# Patient Record
Sex: Female | Born: 1935
Health system: Southern US, Community
[De-identification: ages and names within clinical notes are randomized; demographics above are authoritative.]

## PROBLEM LIST (undated history)

## (undated) DIAGNOSIS — IMO0002 Reserved for concepts with insufficient information to code with codable children: Secondary | ICD-10-CM

## (undated) DIAGNOSIS — R079 Chest pain, unspecified: Secondary | ICD-10-CM

## (undated) DIAGNOSIS — R112 Nausea with vomiting, unspecified: Secondary | ICD-10-CM

## (undated) DIAGNOSIS — Z87442 Personal history of urinary calculi: Secondary | ICD-10-CM

## (undated) DIAGNOSIS — N189 Chronic kidney disease, unspecified: Secondary | ICD-10-CM

## (undated) DIAGNOSIS — M069 Rheumatoid arthritis, unspecified: Secondary | ICD-10-CM

## (undated) DIAGNOSIS — Z9889 Other specified postprocedural states: Secondary | ICD-10-CM

## (undated) DIAGNOSIS — I340 Nonrheumatic mitral (valve) insufficiency: Secondary | ICD-10-CM

## (undated) DIAGNOSIS — A0472 Enterocolitis due to Clostridium difficile, not specified as recurrent: Secondary | ICD-10-CM

## (undated) DIAGNOSIS — R9430 Abnormal result of cardiovascular function study, unspecified: Secondary | ICD-10-CM

## (undated) DIAGNOSIS — J189 Pneumonia, unspecified organism: Secondary | ICD-10-CM

## (undated) DIAGNOSIS — R002 Palpitations: Secondary | ICD-10-CM

## (undated) DIAGNOSIS — E785 Hyperlipidemia, unspecified: Secondary | ICD-10-CM

## (undated) DIAGNOSIS — K219 Gastro-esophageal reflux disease without esophagitis: Secondary | ICD-10-CM

## (undated) DIAGNOSIS — L409 Psoriasis, unspecified: Secondary | ICD-10-CM

## (undated) DIAGNOSIS — D649 Anemia, unspecified: Secondary | ICD-10-CM

## (undated) DIAGNOSIS — I499 Cardiac arrhythmia, unspecified: Secondary | ICD-10-CM

## (undated) DIAGNOSIS — R413 Other amnesia: Secondary | ICD-10-CM

## (undated) DIAGNOSIS — Z95818 Presence of other cardiac implants and grafts: Secondary | ICD-10-CM

## (undated) DIAGNOSIS — R0989 Other specified symptoms and signs involving the circulatory and respiratory systems: Secondary | ICD-10-CM

## (undated) DIAGNOSIS — K5790 Diverticulosis of intestine, part unspecified, without perforation or abscess without bleeding: Secondary | ICD-10-CM

## (undated) HISTORY — DX: Nonrheumatic mitral (valve) insufficiency: I34.0

## (undated) HISTORY — PX: OTHER SURGICAL HISTORY: SHX169

## (undated) HISTORY — DX: Palpitations: R00.2

## (undated) HISTORY — DX: Reserved for concepts with insufficient information to code with codable children: IMO0002

## (undated) HISTORY — DX: Psoriasis, unspecified: L40.9

## (undated) HISTORY — DX: Hyperlipidemia, unspecified: E78.5

## (undated) HISTORY — DX: Other specified symptoms and signs involving the circulatory and respiratory systems: R09.89

## (undated) HISTORY — PX: FRACTURE SURGERY: SHX138

## (undated) HISTORY — DX: Chest pain, unspecified: R07.9

## (undated) HISTORY — DX: Abnormal result of cardiovascular function study, unspecified: R94.30

## (undated) HISTORY — DX: Gastro-esophageal reflux disease without esophagitis: K21.9

## (undated) HISTORY — DX: Diverticulosis of intestine, part unspecified, without perforation or abscess without bleeding: K57.90

## (undated) HISTORY — DX: Rheumatoid arthritis, unspecified: M06.9

---
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
## 1997-10-05 ENCOUNTER — Ambulatory Visit (HOSPITAL_COMMUNITY): Admission: RE | Admit: 1997-10-05 | Discharge: 1997-10-05 | Payer: Self-pay | Admitting: Endocrinology

## 1998-04-01 ENCOUNTER — Inpatient Hospital Stay (HOSPITAL_COMMUNITY): Admission: EM | Admit: 1998-04-01 | Discharge: 1998-04-02 | Payer: Self-pay | Admitting: Emergency Medicine

## 1998-04-01 ENCOUNTER — Encounter: Payer: Self-pay | Admitting: *Deleted

## 1998-04-02 ENCOUNTER — Encounter: Payer: Self-pay | Admitting: Cardiology

## 1998-11-06 ENCOUNTER — Emergency Department (HOSPITAL_COMMUNITY): Admission: EM | Admit: 1998-11-06 | Discharge: 1998-11-06 | Payer: Self-pay | Admitting: Emergency Medicine

## 1998-11-06 ENCOUNTER — Encounter: Payer: Self-pay | Admitting: Emergency Medicine

## 1999-05-30 ENCOUNTER — Ambulatory Visit (HOSPITAL_COMMUNITY): Admission: RE | Admit: 1999-05-30 | Discharge: 1999-05-30 | Payer: Self-pay | Admitting: Gastroenterology

## 1999-05-30 ENCOUNTER — Encounter (INDEPENDENT_AMBULATORY_CARE_PROVIDER_SITE_OTHER): Payer: Self-pay

## 1999-10-12 ENCOUNTER — Emergency Department (HOSPITAL_COMMUNITY): Admission: EM | Admit: 1999-10-12 | Discharge: 1999-10-12 | Payer: Self-pay | Admitting: Emergency Medicine

## 1999-10-14 ENCOUNTER — Emergency Department (HOSPITAL_COMMUNITY): Admission: EM | Admit: 1999-10-14 | Discharge: 1999-10-14 | Payer: Self-pay | Admitting: Emergency Medicine

## 2000-03-16 ENCOUNTER — Encounter: Payer: Self-pay | Admitting: Emergency Medicine

## 2000-03-16 ENCOUNTER — Emergency Department (HOSPITAL_COMMUNITY): Admission: EM | Admit: 2000-03-16 | Discharge: 2000-03-16 | Payer: Self-pay | Admitting: Emergency Medicine

## 2000-03-18 ENCOUNTER — Emergency Department (HOSPITAL_COMMUNITY): Admission: EM | Admit: 2000-03-18 | Discharge: 2000-03-18 | Payer: Self-pay | Admitting: Emergency Medicine

## 2000-03-23 ENCOUNTER — Emergency Department (HOSPITAL_COMMUNITY): Admission: EM | Admit: 2000-03-23 | Discharge: 2000-03-23 | Payer: Self-pay | Admitting: Emergency Medicine

## 2001-11-14 ENCOUNTER — Encounter: Payer: Self-pay | Admitting: Emergency Medicine

## 2001-11-15 ENCOUNTER — Encounter: Payer: Self-pay | Admitting: Pediatrics

## 2001-11-15 ENCOUNTER — Inpatient Hospital Stay (HOSPITAL_COMMUNITY): Admission: EM | Admit: 2001-11-15 | Discharge: 2001-11-15 | Payer: Self-pay | Admitting: Emergency Medicine

## 2002-06-26 ENCOUNTER — Encounter: Payer: Self-pay | Admitting: Emergency Medicine

## 2002-06-26 ENCOUNTER — Emergency Department (HOSPITAL_COMMUNITY): Admission: EM | Admit: 2002-06-26 | Discharge: 2002-06-26 | Payer: Self-pay

## 2003-06-17 ENCOUNTER — Inpatient Hospital Stay (HOSPITAL_COMMUNITY): Admission: EM | Admit: 2003-06-17 | Discharge: 2003-06-22 | Payer: Self-pay

## 2004-04-12 ENCOUNTER — Encounter: Admission: RE | Admit: 2004-04-12 | Discharge: 2004-04-12 | Payer: Self-pay | Admitting: Endocrinology

## 2004-04-13 ENCOUNTER — Ambulatory Visit: Payer: Self-pay | Admitting: Cardiology

## 2004-04-17 ENCOUNTER — Ambulatory Visit: Payer: Self-pay | Admitting: Cardiology

## 2004-04-26 ENCOUNTER — Ambulatory Visit: Payer: Self-pay | Admitting: Internal Medicine

## 2004-05-19 ENCOUNTER — Ambulatory Visit: Payer: Self-pay | Admitting: Cardiology

## 2004-05-21 ENCOUNTER — Emergency Department (HOSPITAL_COMMUNITY): Admission: EM | Admit: 2004-05-21 | Discharge: 2004-05-21 | Payer: Self-pay | Admitting: Emergency Medicine

## 2004-05-24 ENCOUNTER — Ambulatory Visit: Payer: Self-pay | Admitting: Internal Medicine

## 2004-07-05 ENCOUNTER — Ambulatory Visit: Payer: Self-pay | Admitting: Cardiology

## 2006-06-27 ENCOUNTER — Ambulatory Visit: Payer: Self-pay | Admitting: *Deleted

## 2006-07-02 ENCOUNTER — Ambulatory Visit: Payer: Self-pay | Admitting: Cardiology

## 2006-07-08 ENCOUNTER — Ambulatory Visit: Payer: Self-pay

## 2006-07-18 ENCOUNTER — Ambulatory Visit: Payer: Self-pay | Admitting: Cardiology

## 2006-07-22 ENCOUNTER — Encounter: Admission: RE | Admit: 2006-07-22 | Discharge: 2006-07-22 | Payer: Self-pay | Admitting: Endocrinology

## 2006-08-21 ENCOUNTER — Ambulatory Visit: Payer: Self-pay | Admitting: Cardiology

## 2007-01-20 ENCOUNTER — Encounter: Admission: RE | Admit: 2007-01-20 | Discharge: 2007-01-20 | Payer: Self-pay | Admitting: Endocrinology

## 2008-04-21 ENCOUNTER — Ambulatory Visit (HOSPITAL_COMMUNITY): Admission: RE | Admit: 2008-04-21 | Discharge: 2008-04-21 | Payer: Self-pay | Admitting: Endocrinology

## 2009-02-09 ENCOUNTER — Ambulatory Visit: Payer: Self-pay | Admitting: Cardiology

## 2009-02-09 DIAGNOSIS — M069 Rheumatoid arthritis, unspecified: Secondary | ICD-10-CM | POA: Insufficient documentation

## 2009-02-09 DIAGNOSIS — L408 Other psoriasis: Secondary | ICD-10-CM | POA: Insufficient documentation

## 2009-03-31 ENCOUNTER — Ambulatory Visit: Payer: Self-pay | Admitting: Cardiology

## 2009-04-21 ENCOUNTER — Encounter: Payer: Self-pay | Admitting: Cardiology

## 2009-04-21 ENCOUNTER — Ambulatory Visit: Payer: Self-pay | Admitting: Cardiovascular Disease

## 2009-04-21 ENCOUNTER — Ambulatory Visit: Payer: Self-pay

## 2009-04-21 ENCOUNTER — Ambulatory Visit (HOSPITAL_COMMUNITY): Admission: RE | Admit: 2009-04-21 | Discharge: 2009-04-21 | Payer: Self-pay | Admitting: Cardiology

## 2009-04-21 DIAGNOSIS — I340 Nonrheumatic mitral (valve) insufficiency: Secondary | ICD-10-CM

## 2009-04-21 HISTORY — DX: Nonrheumatic mitral (valve) insufficiency: I34.0

## 2009-07-20 ENCOUNTER — Encounter (INDEPENDENT_AMBULATORY_CARE_PROVIDER_SITE_OTHER): Payer: Self-pay | Admitting: *Deleted

## 2009-07-20 ENCOUNTER — Encounter: Payer: Self-pay | Admitting: Cardiology

## 2009-09-14 ENCOUNTER — Encounter: Payer: Self-pay | Admitting: Cardiology

## 2009-09-15 ENCOUNTER — Ambulatory Visit: Payer: Self-pay | Admitting: Cardiology

## 2009-10-06 ENCOUNTER — Telehealth (INDEPENDENT_AMBULATORY_CARE_PROVIDER_SITE_OTHER): Payer: Self-pay | Admitting: *Deleted

## 2009-12-14 ENCOUNTER — Encounter: Payer: Self-pay | Admitting: Cardiology

## 2010-01-18 ENCOUNTER — Ambulatory Visit: Payer: Self-pay | Admitting: Cardiology

## 2010-02-16 ENCOUNTER — Ambulatory Visit: Payer: Self-pay | Admitting: Cardiology

## 2010-03-26 ENCOUNTER — Encounter: Payer: Self-pay | Admitting: Cardiology

## 2010-03-26 ENCOUNTER — Encounter: Payer: Self-pay | Admitting: Gastroenterology

## 2010-04-04 NOTE — Letter (Signed)
Summary: Appointment - Reminder [place], [place]  [address] [place]   [place], [place] [address]   Phone: [phone]  Fax: [phone]     Jul 20, 2009 MRN: [id number]   [address], [place]  [address]   Dear Ms. [name],  Our records indicate that it is time to schedule a follow-up appointment with Dr. Ron Parker. It is very important that we reach you to schedule this appointment. We look forward to participating in your health care needs. Please contact us at the number listed above at your earliest convenience to schedule your appointment.  If you are unable to make an appointment at this time, give [place] a call so we can update our records.     Sincerely,    Darnell Level [facility] Scheduling Team

## 2010-04-04 NOTE — Miscellaneous (Signed)
  Clinical Lists Changes  Problems: Removed problem of MURMUR (ICD-785.2) Added new problem of MITRAL REGURGITATION (ICD-396.3) Observations: Added new observation of PAST MED HX: mitral regurgitation   mild... echo... February 17, 201 EF 55-60%... echo... April 21, 2009 CHEST PAIN-UNSPECIFIED (ICD-786.50).... nuclear.. May, 2008... no scar or ischemia PALPITATIONS (ICD-785.1)... possible very brief atrial fibrillation on monitor and possible reentrant tachycardia........ symptoms treated well with beta blocker diabetes. Bruit ????... Doppler done per patient with no abnormality (carotid) History of adrenal insufficiency? Rheumatoid arthritis. History of prednisone therapy in the past. GERD. Osteoporosis. Psoriasis (09/14/2009 17:35) Added new observation of PRIMARY MD: Carrolyn Meiers, MD (09/14/2009 17:35)       Past History:  Past Medical History: mitral regurgitation   mild... echo... February 17, 201 EF 55-60%... echo... April 21, 2009 CHEST PAIN-UNSPECIFIED (ICD-786.50).... nuclear.. May, 2008... no scar or ischemia PALPITATIONS (ICD-785.1)... possible very brief atrial fibrillation on monitor and possible reentrant tachycardia........ symptoms treated well with beta blocker diabetes. Bruit ????... Doppler done per patient with no abnormality (carotid) History of adrenal insufficiency? Rheumatoid arthritis. History of prednisone therapy in the past. GERD. Osteoporosis. Psoriasis

## 2010-04-04 NOTE — Assessment & Plan Note (Signed)
Summary: per check out/sf  Medications Added ENBREL 50 MG/ML SOLN (ETANERCEPT) every 2 weeks      Allergies Added:   Visit Type:  Follow-up Primary Provider:  Carrolyn Meiers, MD  CC:  palpitations.  History of Present Illness: The patient is seen for followup of palpitations. when I saw her last on February 09, 2009, I added a small dose of diltiazem if it would help with possible short bursts of supraventricular tachycardia.  She says this is definitely helped and she feels good.  She is not having any chest pain.  Current Medications (verified): 1)  Enbrel 50 Mg/ml Soln (Etanercept) .... Every 2 Weeks 2)  Aspirin 81 Mg Tbec (Aspirin) .... Take One Tablet By Mouth Daily 3)  Glipizide 10 Mg Tabs (Glipizide) .... Take 1 Tablet By Mouth Two Times A Day 4)  Nexium 40 Mg Cpdr (Esomeprazole Magnesium) .... Take 1 Tablet By Mouth Once A Day 5)  Metoprolol Succinate 25 Mg Xr24h-Tab (Metoprolol Succinate) .... Take One Tablet By Mouth Daily 6)  Prednisone 10 Mg Tabs (Prednisone) .... 1/2 Tab Daily 7)  Pre-Natal  Tabs (Prenatal Multivit-Min-Fe-Fa) .... Take 1 Tablet By Mouth Once A Day 8)  Metformin Hcl 1000 Mg Tabs (Metformin Hcl) .... 1/2 Tab Two Times A Day 9)  Caltrate 600+d Plus 600-400 Mg-Unit Tabs (Calcium Carbonate-Vit D-Min) .... Take 1 Tablet By Mouth Once A Day 10)  Simvastatin 80 Mg Tabs (Simvastatin) .... Take 1/2 Tablet By Mouth Daily At Bedtime 11)  Diltiazem Hcl Cr 120 Mg Xr12h-Cap (Diltiazem Hcl) .... Take 1 Capsule By Mouth Once A Day  Allergies (verified): 1)  Sulfa  Past History:  Past Medical History: Last updated: 02/09/2009 MURMUR (ICD-785.2)...soft systolic murmur.... no studies done CHEST PAIN-UNSPECIFIED (ICD-786.50).... nuclear.. May, 2008... no scar or ischemia PALPITATIONS (ICD-785.1)... possible very brief atrial fibrillation on monitor and possible reentrant tachycardia........ symptoms treated well with beta blocker diabetes. Bruit ????... Doppler done per  patient with no abnormality (carotid) History of adrenal insufficiency? Rheumatoid arthritis. History of prednisone therapy in the past. GERD. Osteoporosis. Psoriasis  Review of Systems       Patient denies fever, chills, headache, sweats, rash, change in vision, change in hearing, chest pain, cough, shortness of breath, nausea vomiting, urinary symptoms.  All of the systems are reviewed and are negative.  Vital Signs:  Patient profile:   77 year old female Height:      61 inches Weight:      145 pounds BMI:     27.50 Pulse rate:   70 / minute BP sitting:   134 / 76  (left arm) Cuff size:   regular  Vitals Entered By: Mignon Pine, RMA (March 31, 2009 3:24 PM)  Physical Exam  General:  patient is stable today in general. Eyes:  no xanthelasma. Neck:  no jugular venous distention. Lungs:  lungs are clear respiratory effort is nonlabored. Heart:  cardiac exam reveals S1-S2.  There is a systolic murmur heard. Abdomen:  abdomen is soft. Extremities:  hands are compatible with rheumatoid arthritis.  No peripheral edema. Psych:  patient is oriented to person time and place.  Affect is normal.   Impression & Recommendations:  Problem # 1:  RHEUMATOID ARTHRITIS (ICD-714.0) patient has significant changes of rheumatoid arthritis but she is stable.  Problem # 2:  CHEST PAIN-UNSPECIFIED (ICD-786.50)  Her updated medication list for this problem includes:    Aspirin 81 Mg Tbec (Aspirin) .Marland Kitchen... Take one tablet by mouth daily    Metoprolol  Succinate 25 Mg Xr24h-tab (Metoprolol succinate) .Marland Kitchen... Take one tablet by mouth daily    Diltiazem Hcl Cr 120 Mg Xr12h-cap (Diltiazem hcl) .Marland Kitchen... Take 1 capsule by mouth once a day The patient has not had any chest pain.  No further workup.  Problem # 3:  PALPITATIONS (ICD-785.1)  Her updated medication list for this problem includes:    Aspirin 81 Mg Tbec (Aspirin) .Marland Kitchen... Take one tablet by mouth daily    Metoprolol Succinate 25 Mg  Xr24h-tab (Metoprolol succinate) .Marland Kitchen... Take one tablet by mouth daily    Diltiazem Hcl Cr 120 Mg Xr12h-cap (Diltiazem hcl) .Marland Kitchen... Take 1 capsule by mouth once a day Palpitations are improved with diltiazem.  The medicine and continued.  Problem # 4:  MURMUR (ICD-785.2)  Her updated medication list for this problem includes:    Metoprolol Succinate 25 Mg Xr24h-tab (Metoprolol succinate) .Marland Kitchen... Take one tablet by mouth daily The patient has a systolic murmur.It is now time for [place] to proceed with a 2-D echo to assess LV function and valvular function.  She has never had an echo.  I be in touch with her with the results.  I'll plan to see her back in 6 months.  Orders: Echocardiogram (Echo)  Patient Instructions: 1)  Your physician has requested that you have an echocardiogram.  Echocardiography is a painless test that uses sound waves to create images of your heart. It provides your doctor with information about the size and shape of your heart and how well your heart's chambers and valves are working.  This procedure takes approximately one hour. There are no restrictions for this procedure. 2)  Follow up in 6 months

## 2010-04-04 NOTE — Letter (Signed)
Summary: [facility] Office Note  [facility] Office Note   Imported By: Sallee Provencal 08/11/2009 14:20:39  _____________________________________________________________________  External Attachment:    Type:   Image     Comment:   External Document

## 2010-04-04 NOTE — Assessment & Plan Note (Signed)
Summary: 6 month rov/sl  Medications Added BONIVA 150 MG TABS (IBANDRONATE SODIUM) 1 by mouth monthly BUFFERIN 325 MG TABS (ASPIRIN BUF(CACARB-MGCARB-MGO)) 1 by mouth daily      Allergies Added:   Visit Type:  Follow-up Primary Provider:  Carrolyn Meiers, MD  CC:  palpitations.  History of Present Illness: The patient is seen for followup of palpitations.  I saw her last January, 2011.  She does very well with low-dose beta-blockade and low dose diltiazem.  By history there is possible brief bursts of reentrant supraventricular tachycardia.  There is no need for further workup at this time.  In the morning if she forgets to take her medicine she may feel a few palpitations.  Otherwise she is asymptomatic.  Two-dimensional echo was done in February, 2011.  This showed good LV function.  There was mild mitral regurgitation.  Current Medications (verified): 1)  Enbrel 50 Mg/ml Soln (Etanercept) .... Every 2 Weeks 2)  Aspirin 81 Mg Tbec (Aspirin) .... Take One Tablet By Mouth Daily 3)  Glipizide 10 Mg Tabs (Glipizide) .... Take 1 Tablet By Mouth Two Times A Day 4)  Nexium 40 Mg Cpdr (Esomeprazole Magnesium) .... Take 1 Tablet By Mouth Once A Day 5)  Metoprolol Succinate 25 Mg Xr24h-Tab (Metoprolol Succinate) .... Take One Tablet By Mouth Daily 6)  Prednisone 10 Mg Tabs (Prednisone) .... 1/2 Tab Daily 7)  Pre-Natal  Tabs (Prenatal Multivit-Min-Fe-Fa) .... Take 1 Tablet By Mouth Once A Day 8)  Metformin Hcl 1000 Mg Tabs (Metformin Hcl) .... 1/2 Tab Two Times A Day 9)  Caltrate 600+d Plus 600-400 Mg-Unit Tabs (Calcium Carbonate-Vit D-Min) .... Take 1 Tablet By Mouth Once A Day 10)  Simvastatin 80 Mg Tabs (Simvastatin) .... Take 1/2 Tablet By Mouth Daily At Bedtime 11)  Diltiazem Hcl Cr 120 Mg Xr12h-Cap (Diltiazem Hcl) .... Take 1 Capsule By Mouth Once A Day 12)  Boniva 150 Mg Tabs (Ibandronate Sodium) .Marland Kitchen.. 1 By Mouth Monthly 13)  Bufferin 325 Mg Tabs (Aspirin Buf(Cacarb-Mgcarb-Mgo)) .Marland Kitchen.. 1 By  Mouth Daily  Allergies (verified): 1)  Sulfa  Past History:  Past Medical History: mitral regurgitation   mild... echo... April 21, 2009 EF 55-60%... echo... April 21, 2009 CHEST PAIN-UNSPECIFIED (ICD-786.50).... nuclear.. May, 2008... no scar or ischemia PALPITATIONS (ICD-785.1)... possible very brief atrial fibrillation on monitor and possible reentrant tachycardia........ symptoms treated well with beta blocker diabetes. Bruit ????... Doppler done per patient with no abnormality (carotid) History of adrenal insufficiency? Rheumatoid arthritis. History of prednisone therapy in the past. GERD. Osteoporosis. Psoriasis  Review of Systems       Patient denies fever, chills, headache, sweats, rash, change in vision, change in hearing, chest pain, cough, nausea vomiting, urinary symptoms.  All other systems are reviewed and are negative.  Vital Signs:  Patient profile:   77 year old female Height:      61 inches Weight:      142 pounds BMI:     26.93 Pulse rate:   65 / minute Resp:     16 per minute BP sitting:   112 / 64  (left arm)  Vitals Entered By: Levora Angel, CNA (September 15, 2009 10:10 AM)  Physical Exam  General:  patient is stable. Eyes:  no xanthelasma. Neck:  there is a soft left carotid bruit. Lungs:  lungs are clear.  Respiratory effort is nonlabored. Heart:  cardiac exam reveals S1-S2.  There is a 2/6 systolic murmur. Abdomen:  abdomen is soft. Msk:  The patient does  have changes of rheumatoid arthritis in her hands. Extremities:  no peripheral edema. Psych:  patient is oriented to person time and place.  Affect is normal.   Impression & Recommendations:  Problem # 1:  MITRAL REGURGITATION (ICD-396.3) we know this is mild from her echo in February, 2011.  Problem # 2:  CHEST PAIN-UNSPECIFIED (ICD-786.50)  The patient has not had any recurrent chest pain.  Orders: EKG w/ Interpretation (93000)  Problem # 3:  PALPITATIONS (ICD-785.1) The  patient's palpitations are controlled with meds.  No further workup needed.  EKG is done today and reviewed by me.  Is normal sinus rhythm.  No further workup.  One-year followup.  Patient Instructions: 1)  Your physician wants you to follow-up in:  1 year.  You will receive a reminder letter in the mail two months in advance. If you don't receive a letter, please call our office to schedule the follow-up appointment.

## 2010-04-04 NOTE — Assessment & Plan Note (Signed)
Summary: rov. chestpain/ gd  Medications Added PREVACID 24HR 15 MG CPDR (LANSOPRAZOLE) once daily CRESTOR 10 MG TABS (ROSUVASTATIN CALCIUM) Take one tablet once a week CRESTOR 5 MG TABS (ROSUVASTATIN CALCIUM) Take one tablet once a week NITROSTAT 0.4 MG SUBL (NITROGLYCERIN) 1 tablet under tongue at onset of chest pain; you may repeat every 5 minutes for up to 3 doses.      Allergies Added:   Visit Type:  Add On Primary Provider:  Carrolyn Meiers, MD  CC:  chest pain.  History of Present Illness: The patient is seen today for evaluation of chest pain.  She had had chest pain in the past.  Nuclear scan in 2008 revealed no scar or ischemia.  She had some palpitations in the past.  She felt better with the beta blocker.  The monitor had shown either very brief atrial fibrillation or possibly a brief episode of a reentrant tachycardia.  Days ago she felt poorly.  She does mention or chest discomfort.  However she also mentions to me symptoms that sound more like the possibility of palpitations.  She did take one of her husband's nitroglycerin.  She says that it helped but in the 15 minute range.  She saw Dr. Forde Dandy and he arranged for early followup here.  Current Medications (verified): 1)  Enbrel 50 Mg/ml Soln (Etanercept) .... Every 2 Weeks 2)  Aspirin 81 Mg Tbec (Aspirin) .... Take One Tablet By Mouth Daily 3)  Glipizide 10 Mg Tabs (Glipizide) .... Take 1 Tablet By Mouth Two Times A Day 4)  Prevacid 24hr 15 Mg Cpdr (Lansoprazole) .... Once Daily 5)  Metoprolol Succinate 25 Mg Xr24h-Tab (Metoprolol Succinate) .... Take One Tablet By Mouth Daily 6)  Prednisone 10 Mg Tabs (Prednisone) .... 1/2 Tab Daily 7)  Pre-Natal  Tabs (Prenatal Multivit-Min-Fe-Fa) .... Take 1 Tablet By Mouth Once A Day 8)  Metformin Hcl 1000 Mg Tabs (Metformin Hcl) .... 1/2 Tab Two Times A Day 9)  Caltrate 600+d Plus 600-400 Mg-Unit Tabs (Calcium Carbonate-Vit D-Min) .... Take 1 Tablet By Mouth Once A Day 10)  Diltiazem  Hcl Cr 120 Mg Xr12h-Cap (Diltiazem Hcl) .... Take 1 Capsule By Mouth Once A Day 11)  Boniva 150 Mg Tabs (Ibandronate Sodium) .Marland Kitchen.. 1 By Mouth Monthly 12)  Crestor 5 Mg Tabs (Rosuvastatin Calcium) .... Take One Tablet Once A Week  Allergies (verified): 1)  Sulfa  Past History:  Past Medical History: mitral regurgitation   mild... echo... April 21, 2009 EF 55-60%... echo... April 21, 2009 CHEST PAIN-UNSPECIFIED (ICD-786.50).... nuclear.. May, 2008... no scar or ischemia PALPITATIONS (ICD-785.1)... possible very brief atrial fibrillation on monitor and possible reentrant tachycardia........ symptoms treated well with beta blocker diabetes. Bruit ????... Doppler done per patient with no abnormality (carotid) History of adrenal insufficiency? Rheumatoid arthritis. History of prednisone therapy in the past. GERD. Osteoporosis. Psoriasis.Marland Kitchen Dyslipidemia  Review of Systems       Patient denies fever, chills, headache, sweats, rash, change in vision, change in hearing, cough, nausea vomiting, urinary symptoms.  All other systems are reviewed and are negative.  Vital Signs:  Patient profile:   77 year old female Height:      61 inches Weight:      144 pounds BMI:     27.31 Pulse rate:   69 / minute BP sitting:   112 / 60  (left arm) Cuff size:   regular  Vitals Entered By: Hansel Feinstein CMA (January 18, 2010 3:11 PM)  Physical Exam  General:  patient feels fine today. Head:  head is atraumatic. Eyes:  no xanthelasma. Neck:  no jugular venous distention. Chest Wall:  no chest wall tenderness. Lungs:  lungs are clear.  Respiratory effort is nonlabored. Heart:  cardiac exam reveals S1-S2.  There no clicks or significant murmurs. Abdomen:  abdomen is soft. Msk:  patient has significant changes so deforming rheumatoid arthritis. Extremities:  no peripheral edema. Skin:  no skin rashes. Psych:  patient is oriented to person time and place.  Affect is normal.   Impression &  Recommendations:  Problem # 1:  MITRAL REGURGITATION (ICD-396.3) Mitral regurgitation was mild by echo in February, 2011.  No further workup needed.  Problem # 2:  CHEST PAIN-UNSPECIFIED (ICD-786.50)  Her updated medication list for this problem includes:    Aspirin 81 Mg Tbec (Aspirin) .Marland Kitchen... Take one tablet by mouth daily    Metoprolol Succinate 25 Mg Xr24h-tab (Metoprolol succinate) .Marland Kitchen... Take one tablet by mouth daily    Diltiazem Hcl Cr 120 Mg Xr12h-cap (Diltiazem hcl) .Marland Kitchen... Take 1 capsule by mouth once a day    Nitrostat 0.4 Mg Subl (Nitroglycerin) .Marland Kitchen... 1 tablet under tongue at onset of chest pain; you may repeat every 5 minutes for up to 3 doses. As outlined in the history of present illness, the patient had some chest discomfort and  symptoms souning  like palpitations several days ago.  There is question that nitroglycerin helped but are not sure.  I wonder if her symptoms were not a brief burst of supraventricular tachycardia.  She is fine at this point.  EKG is done and reviewed by me.  EKG is normal.  I've chosen not to change any medicines.  Also chosen not to proceed with a followup exercise test at this point.  I will see her back in several weeks to reassess any symptoms that she is having.  We'll make further decisions on the approach to her care at that time.  Orders: EKG w/ Interpretation (93000)  Problem # 3:  PALPITATIONS (ICD-785.1)  Her updated medication list for this problem includes:    Aspirin 81 Mg Tbec (Aspirin) .Marland Kitchen... Take one tablet by mouth daily    Metoprolol Succinate 25 Mg Xr24h-tab (Metoprolol succinate) .Marland Kitchen... Take one tablet by mouth daily    Diltiazem Hcl Cr 120 Mg Xr12h-cap (Diltiazem hcl) .Marland Kitchen... Take 1 capsule by mouth once a day    Nitrostat 0.4 Mg Subl (Nitroglycerin) .Marland Kitchen... 1 tablet under tongue at onset of chest pain; you may repeat every 5 minutes for up to 3 doses. As outlined the patient has had a short burst of either atrial fib or supraventricular  tachycardia in the past.  It is possible that this is playing a role now.  I chosen not to change her medicines.  Problem # 4:  DYSLIPIDEMIA (ICD-272.4)  The following medications were removed from the medication list:    Simvastatin 80 Mg Tabs (Simvastatin) .Marland Kitchen... Take 1/2 tablet by mouth daily at bedtime Her updated medication list for this problem includes:    Crestor 5 Mg Tabs (Rosuvastatin calcium) .Marland Kitchen... Take one tablet once a week Dr. Forde Dandy and started the patient on a small dose of Crestor.  This sounds like an excellent choice.  Patient Instructions: 1)  Your physician recommended you take 1 tablet  under tongue at onset of chest pain; you may repeat every 5 minutes for up to 3 doses. If 3 or more doses are required, call 911 and proceed to the ER immediately.  2)  Follow up in 3-4 weeks Prescriptions: NITROSTAT 0.4 MG SUBL (NITROGLYCERIN) 1 tablet under tongue at onset of chest pain; you may repeat every 5 minutes for up to 3 doses.  #25 x 6   Entered by:   Kevan Rosebush, RN   Authorized by:   Lorenza Evangelist, MD, [facility]   Signed by:   Kevan Rosebush, RN on 01/18/2010   Method used:   Electronically to        [facility] [phone]* (retail)       [place], [place]  [id number]       Ph: [phone] or [phone]       Fax: [phone]   RxID:   [id number]

## 2010-04-04 NOTE — Progress Notes (Signed)
Summary: SImva vs Diltiazem   ---- Converted from flag ---- ---- 10/06/2009 8:57 AM, Lorenza Evangelist, MD, [facility] wrote: Her primary MD is Dr. Carrolyn Meiers (endocrine). I don't see any labs in flow sheet. I suspect that Dr. Forde Dandy manages her lipids. Call patient, explain issue to her, and have her call Dr. Forde Dandy to get what he wants to do. She will understand.  ---- 10/05/2009 12:00 PM, Mignon Pine, RMA wrote: Ms Yannotti is on Diltiazem 120 and also Simva 80 -- What would you like me to do? ------------------------------  Phone Note Outgoing Call   Call placed by: Mignon Pine, RMA,  October 06, 2009 2:28 PM Summary of Call: lmom for Ms Dunivan.  Need to let her know to talk to Dr Forde Dandy about Simvastatin since she is also on Diltiazem Mignon Pine, [place]  October 06, 2009 2:30 PM   Follow-up for Phone Call        Pt called back and I let her know what was going on, she did get a letter from [facility] that told her not to take the [place]. Told her to call Dr [place] office so  he could get her switched over to something else. Follow-up by: Mignon Pine, RMA,  October 07, 2009 10:43 AM

## 2010-04-06 NOTE — Assessment & Plan Note (Signed)
Summary: 1 month rov.sl  Medications Added METFORMIN HCL 1000 MG TABS (METFORMIN HCL) 1/2 tab once daily CRESTOR 10 MG TABS (ROSUVASTATIN CALCIUM) Take one tablet by mouth once a week      Allergies Added:   Visit Type:  Follow-up Primary Provider:  Carrolyn Meiers, MD  CC:  chest pain.  History of Present Illness: The patient is seen for followup of chest pain.  I had seen her in January 18, 2010.  At that time we decided just to watch her in to see her back for followup.  She is feeling better.  I feel comfortable that we do not need to proceed with any further chest.  Current Medications (verified): 1)  Enbrel 50 Mg/ml Soln (Etanercept) .... Every 2 Weeks 2)  Aspirin 81 Mg Tbec (Aspirin) .... Take One Tablet By Mouth Daily 3)  Glipizide 10 Mg Tabs (Glipizide) .... Take 1 Tablet By Mouth Two Times A Day 4)  Prevacid 24hr 15 Mg Cpdr (Lansoprazole) .... Once Daily 5)  Metoprolol Succinate 25 Mg Xr24h-Tab (Metoprolol Succinate) .... Take One Tablet By Mouth Daily 6)  Prednisone 10 Mg Tabs (Prednisone) .... 1/2 Tab Daily 7)  Pre-Natal  Tabs (Prenatal Multivit-Min-Fe-Fa) .... Take 1 Tablet By Mouth Once A Day 8)  Metformin Hcl 1000 Mg Tabs (Metformin Hcl) .... 1/2 Tab Once Daily 9)  Caltrate 600+d Plus 600-400 Mg-Unit Tabs (Calcium Carbonate-Vit D-Min) .... Take 1 Tablet By Mouth Once A Day 10)  Diltiazem Hcl Cr 120 Mg Xr12h-Cap (Diltiazem Hcl) .... Take 1 Capsule By Mouth Once A Day 11)  Boniva 150 Mg Tabs (Ibandronate Sodium) .Marland Kitchen.. 1 By Mouth Monthly 12)  Crestor 10 Mg Tabs (Rosuvastatin Calcium) .... Take One Tablet By Mouth Once A Week 13)  Nitrostat 0.4 Mg Subl (Nitroglycerin) .Marland Kitchen.. 1 Tablet Under Tongue At Onset of Chest Pain; You May Repeat Every 5 Minutes For Up To 3 Doses.  Allergies (verified): 1)  Sulfa  Past History:  Past Medical History: mitral regurgitation   mild... echo... April 21, 2009 EF 55-60%... echo... April 21, 2009 CHEST PAIN-UNSPECIFIED  (ICD-786.50).... nuclear.. May, 2008... no scar or ischemia PALPITATIONS (ICD-785.1)... possible very brief atrial fibrillation on monitor and possible reentrant tachycardia........ symptoms treated well with beta blocker diabetes. Bruit ????... Doppler done per patient with no abnormality (carotid) History of adrenal insufficiency? Rheumatoid arthritis. History of prednisone therapy in the past. GERD. Osteoporosis... Psoriasis.Marland Kitchen Dyslipidemia  Review of Systems       The patient denies fever, chills, headache, sweats, rash, change in vision, change in hearing, chest pain, cough, nausea vomiting, urinary symptoms.  She has arthritis that is significant with her rheumatoid arthritis.  This bothers her but she tolerates it.  All other systems are reviewed and are negative.  Vital Signs:  Patient profile:   77 year old female Height:      61 inches Weight:      142 pounds BMI:     26.93 Pulse rate:   65 / minute BP sitting:   108 / 50  (left arm) Cuff size:   regular  Vitals Entered By: Mignon Pine, RMA (February 16, 2010 10:51 AM)  Physical Exam  General:  he is stable today. Eyes:  no xanthelasma. Neck:  no jugular venous distention. Lungs:  lungs are clear respiratory effort is nonlabored. Heart:  cardiac exam reveals S1-S2.  No clicks or significant murmurs. Abdomen:  abdomen soft. Msk:  she has deforming rheumatoid arthritis. Extremities:  no peripheral edema. Psych:  patient  is oriented to person time and place.  Affect is normal.   Impression & Recommendations:  Problem # 1:  DYSLIPIDEMIA (ICD-272.4)  Her updated medication list for this problem includes:    Crestor 10 Mg Tabs (Rosuvastatin calcium) .Marland Kitchen... Take one tablet by mouth once a week Lipids are treated.  No change in therapy.  Problem # 2:  CHEST PAIN-UNSPECIFIED (ICD-786.50)  Her updated medication list for this problem includes:    Aspirin 81 Mg Tbec (Aspirin) .Marland Kitchen... Take one tablet by mouth daily     Metoprolol Succinate 25 Mg Xr24h-tab (Metoprolol succinate) .Marland Kitchen... Take one tablet by mouth daily    Diltiazem Hcl Cr 120 Mg Xr12h-cap (Diltiazem hcl) .Marland Kitchen... Take 1 capsule by mouth once a day    Nitrostat 0.4 Mg Subl (Nitroglycerin) .Marland Kitchen... 1 tablet under tongue at onset of chest pain; you may repeat every 5 minutes for up to 3 doses. Chest pain is stable.  No further cardiac workup.  Problem # 3:  PALPITATIONS (ICD-785.1)  Her updated medication list for this problem includes:    Aspirin 81 Mg Tbec (Aspirin) .Marland Kitchen... Take one tablet by mouth daily    Metoprolol Succinate 25 Mg Xr24h-tab (Metoprolol succinate) .Marland Kitchen... Take one tablet by mouth daily    Diltiazem Hcl Cr 120 Mg Xr12h-cap (Diltiazem hcl) .Marland Kitchen... Take 1 capsule by mouth once a day    Nitrostat 0.4 Mg Subl (Nitroglycerin) .Marland Kitchen... 1 tablet under tongue at onset of chest pain; you may repeat every 5 minutes for up to 3 doses. No significant palpitations.  No further workup.  I will see her back for one year followup.  Patient Instructions: 1)  Your physician wants you to follow-up in:  1 year.  You will receive a reminder letter in the mail two months in advance. If you don't receive a letter, please call our office to schedule the follow-up appointment.

## 2010-07-18 NOTE — Assessment & Plan Note (Signed)
[place] OFFICE NOTE   ADDISYN, Shelby Walker                          MRN:          [id number]  DATE:08/21/2006                            DOB:          Feb 10, 1934    [name] is seen for followup.  See my complete note of July 02, 2006.  She had had some palpitations, and she had had some chest discomfort.  We proceeded with a stress Myoview scan on Jul 08, 2006.  She was  stressed with adenosine.  This was a normal study.  There was normal  wall motion, and nuclear images are normal with no signs of scar or  ischemia.  We also had her wear da CardioNet monitor.  She had sinus  rhythm.  At times, she did have some vague discomfort and palpitations,  and there was rare, mild sinus tachycardia.  Most of the time, she was  in sinus rhythm.  There were no documented tachy or brady arrhythmias.   She feels well today.   Blood pressure is 120/72, and her pulse is 76.   Problems are listed on my note of July 02, 2006.  The patient is  stable.   PROBLEMS:  1. Chest discomfort.  Her Myoview is normal.  2. Palpitation.  Her monitor is stable.  There is no need to consider      Coumadin.  There is no documented atrial fibrillation of      significance.  She is stable, and further cardiac workup is not      needed.     Carlena Bjornstad, MD, [facility]  Electronically Signed    JDK/MedQ  DD: 08/21/2006  DT: 08/21/2006  Job #: [id number]   cc:   Annie Main A. Forde Dandy, M.D.  Michael Litter, M.D.

## 2010-07-21 NOTE — Assessment & Plan Note (Signed)
[place] OFFICE NOTE   AYRIANNA, MCDERMITT                          MRN:          [id number]  DATE:07/02/2006                            DOB:          August 09, 1933    [name] is seen for cardiac evaluation.  I saw her last in May of  2006.  She is having more palpitations.  In the past, she wore a  monitor.  We saw what we thought was possibly a small amount of atrial  fibrillation, but this may have well have been a re-entrant tachycardia.  She has been on low dose beta blocker, and she has done very well, but  more recently, she has had more palpitations.  In addition, frequently  when she has these palpitations, she has pain that goes to her back;  however, at other times, she will have this type of back pain in leaning  over the kitchen sink.  It is not clear if this is angina or not.  We  know from 2005 that she had a nuclear exercise study with no ischemia.  She does have risk factors for coronary disease.   She saw Dr. Forde Dandy recently.  He was concerned after hearing a bruit in  the left neck and arranged for Dopplers.  It is my understanding from  the patient that they showed no significant abnormality.  She is now  here for further cardiac assessment.   The patient has also had some dizziness.  There has been no syncope or  presyncope.   PAST MEDICAL HISTORY:  SULFA, COMPAZINE.   MEDICATIONS:  Humibid, aspirin, Toprol, Nexium, Metformin, Zocor,  Enbrel, Boniva, Glipizide, Anital Ultra, and calcium.   OTHER MEDICAL PROBLEMS:  See the list below.   REVIEW OF SYSTEMS:  In general, she is feeling well.  She is not having  any significant GI or GU symptoms.  The major symptoms have been  described in the HPI.  Otherwise, her review of systems is negative.   PHYSICAL EXAMINATION:  VITAL SIGNS:  Weight is 153 pounds.  Blood  pressure is 120/62 with a pulse of 81.  GENERAL:  Patient is oriented to  person, time, and place.  Her affect is  normal.  She is here with her husband, Shelby Walker, who is also my  patient.  HEENT:  No xanthelasma.  She has normal extraocular motion.  NECK:  There is a question of a soft left carotid bruit.  There is no  jugular venous distention.  LUNGS:  Clear.  Respiratory effort is not labored.  CARDIAC:  An S1 with an S2.  There is a 2/6 systolic murmur that is  crescendo/decrescendo.  ABDOMEN:  Soft.  There are no masses or bruits.  She has normal bowel  sounds.  EXTREMITIES:  She has no peripheral edema.  The patient has classic  changes in her hands of deforming arthritis, rheumatoid arthritis.   EKG copy sent from Dr. Forde Dandy reveals no new diagnostic abnormalities  when compared to her prior tracing.   PROBLEMS:  1.  History of diabetes.  2. History of adrenal insufficiency?  3. Rheumatoid arthritis.  4. History of prednisone therapy in the past.  5. GERD.  6. Osteoporosis.  7. Psoriasis.  8. History of right-sided chest pain in April, 2005 with normal      ejection fraction and no ischemia.  Current chest and back      discomfort.  It is appropriate to proceed with an Adenosine Myoview      that will be done.  9. History of hypertensive response to exercise in the past.  10.Palpitations.  We think she has had some supraventricular      tachycardia.  It will be important to try to redocument what we      think her rhythm really is.  We have to be sure there is not a need      for Coumadin if she has atrial fib.  11.Soft systolic murmur.  When I see her back, we will consider an      echocardiogram.  As far as I can tell, she has never had one.  12.History in the past of low grade rheumatoid lung disease by chest x-      ray and serial CT scans.  13.Mild dizziness recently.  The Doppler was done, as described above.      We will rule out cardiac basis for this.   We will arrange for the monitor and Myoview scan.  I will see her back  in  followup.  I have chosen not to change her medicines at this time.     Carlena Bjornstad, MD, [facility]  Electronically Signed    JDK/MedQ  DD: 07/02/2006  DT: 07/03/2006  Job #: [id number]   cc:   Annie Main A. Forde Dandy, M.D.  Michael Litter, M.D.

## 2010-07-21 NOTE — H&P (Signed)
NAMEELLIET, FALLA                             ACCOUNT NO.:  [id number]   MEDICAL RECORD NO.:  [id number]                   PATIENT TYPE:  INP   LOCATION:  [id number]                                 FACILITY:  [facility]   PHYSICIAN:  Princess Bruins. Gaynell Face, M.D.           DATE OF BIRTH:  1933/09/04   DATE OF ADMISSION:  11/15/2001  DATE OF DISCHARGE:                                HISTORY & PHYSICAL   CLINICAL HISTORY:  The patient is a 77 year old right-handed married woman  who had a two-day history of headache.  I was asked by the emergency  department to see the patient because of unsteadiness and possible  subarachnoid hemorrhage.   HISTORY OF PRESENT ILLNESS:  The patient had onset yesterday of severe  occipital headache simultaneously with onset of nausea and vomiting.  She  thought that she had some form of intestinal flu and treated herself  symptomatically for a day.  She came to the emergency room about 3 p.m.  I  was contacted about 11:30 p.m. after a lumbar puncture showed evidence of  blood that was present in the lumbar puncture, dropping from 1610 red blood  cells to 290 for tubes 1-4, eight white blood cells and 2 white blood cells  on tubes 2-4, normal glucose and protein, 60 and 44 respectively.   The supernatant was not bathochromic.  The patient initially responded to  morphine sulfate and Phenergan and seemed to be doing better.  However, when  the medicines worse off she felt much worse.   REVIEW OF SYSTEMS:  Remarkable for low-grade fever, temperature of 100.  No  other symptoms of infection including cough, rhinorrhea, diarrhea, or rash.  No chest pain, palpitations, shortness of breath, dysuria, bleeding  dyscrasias.  The patient has musculoskeletal pain from severe rheumatoid  arthritis.  No focal neurologic complaints.  Review of systems otherwise  negative.   PAST MEDICAL HISTORY:  1. Rheumatoid arthritis.  2. A 15-year history of type 2 diabetes mellitus.  3.  Hyperlipidemia.  4. Gastroesophageal reflux disease.  5. Osteoporosis.   PAST SURGICAL HISTORY:  None.   MEDICATIONS:  1. Fosamax 70 mg once per week.  2. Glucotrol XL 10 mg 1 twice a day.  3. Glucophage 500 mg 1 twice a day.  4. Avandia 8 mg 1/2 at bedtime.  5. Lantus insulin 20 units at bedtime as needed.  6. Zetia  10 mg at bedtime.  7. Nexium 40 mg per day.  8. Methotrexate 0.25 mg every other week.  9. Remicade 30 mg, interval unknown.  10.      Prednisone 5 mg in the morning, 2.5 mg at nighttime.  11.      Prenate vitamins 1 per day.  12.      Calcium with vitamin D 600 mg 2 tablets per day.   ALLERGIES:  None known.   FAMILY HISTORY:  Positive for  myocardial infarction and diabetes in both  parents.  No history of stroke.   SOCIAL HISTORY:  The patient is married, no children.  She does not use  tobacco or alcohol.   PHYSICAL EXAMINATION:  VITAL SIGNS:  Blood pressure 122/53, resting pulse  83, respirations 24, temperature 97.5.  HEENT:  Ears, nose, and throat:  No infection.  NECK:  Supple.  No bruits.  LUNGS:  Clear.  HEART:  No murmurs.  Pulses normal.  ABDOMEN:  Soft.  Bowel sounds normal.  No hepatosplenomegaly.  EXTREMITIES:  Normal.  NEUROLOGIC:  The patient is awake, had mild lethargy.  No dysphagia or  dystaxia.  Cranial nerves:  Round and reactive pupils.  Visual fields full.  Extraocular movements full.  No diplopia.  Symmetric face.  No nystagmus.  Normal hearing.  Motor examination:  Normal strength except the hip flexors  which are 4/5.  No drift.  Fine motor movements are normal.  Sensory shows  the peripheral polyneuropathy.  Cerebellar:  Finger-to-nose, rapid  alternating movements are okay.  Gait was not tested but was broad-based.  The patient was unsteady, per Dr. Atilano Median.  Deep tendon reflexes are absent,  and the patient had bilateral flexor plantar responses.   IMPRESSION:  Headache, code 884, nausea and vomiting:  The patient may have  a  viral syndrome.  Could have a posteroinferior cerebellar artery stroke.  Her symptoms could be worsened with an addisonian-like situation due to  prednisone withdrawal.   PLAN:  MRI/MRA in the morning.  Will treat the patient with sliding scale  insulin, IV Protonix, stress dose of Solu-Medrol, and Demerol and Phenergan.  I will contact Dr. Forde Dandy or his colleagues tomorrow to assist [place] with the  endocrine portion of this patient's care.                                               Princess Bruins. Gaynell Face, M.D.    [facility]  D:  11/15/2001  T:  11/15/2001  Job:  [id number]   cc:   Annie Main A. Forde Dandy, M.D.  [address]  [place]  [place] [address]  Fax: [phone]

## 2010-07-21 NOTE — Consult Note (Signed)
NAME:  Shelby, Walker                             ACCOUNT NO.:  [id number]   MEDICAL RECORD NO.:  [id number]                   PATIENT TYPE:  INP   LOCATION:  [id number]                                 FACILITY:  [facility]   PHYSICIAN:  Dola Argyle, M.D.                  DATE OF BIRTH:  [birth year]   DATE OF CONSULTATION:  DATE OF DISCHARGE:                                   CONSULTATION   Shelby Walker is currently admitted to [facility] with a GI illness.  She has a  history of rheumatoid arthritis and is known to Dr. Kristen Loader.  She is  followed also by Dr. Carrolyn Meiers.  Dr. Dagmar Hait called today to ask [place] to help  assess her chest pain (I take care of the patient's husband).  The patient  also had had a Cardiolite scan at our office on June 10, 2003.  I now have  those results and they show that the patient had no scar or ischemia.  There  was normal ejection fraction.  She did have a hypertensive response.   The patient had chest pain for many hours yesterday.  There is question that  it was helped with GI medicines.  EKG is unchanged and so far enzymes are  negative.   PAST MEDICAL HISTORY:  Allergies:  SULFA, COMPAZINE, and REMICADE.   Medications:  Prior to admission to admission her medications include:  1. Multivitamin.  2. Nexium.  3. Prednisone 5 mg in the morning and 2.5 mg in the evening.  4. Aspirin.  5. Methotrexate.  6. Glucophage.  7. Calcium.  8. Fosamax.  9. Imodium p.r.n.  10.      Glucotrol.  11.      Avandia.  12.      Zocor.   Other medical problems:  See the complete list below.   SOCIAL HISTORY:  The patient lives with her husband of many years.  She does  not smoke.   FAMILY HISTORY:  There is no history of premature coronary disease.   REVIEW OF SYSTEMS:  The patient was admitted with nausea, vomiting, and  fever, but this is significantly improved.  She does not have significant  shortness of breath at this time.  Her diarrhea is improving.  Her chest  pain  that was present yesterday is now gone.  Remainder of her review of  systems is negative.   PHYSICAL EXAMINATION:  VITAL SIGNS:  Blood pressure is 120/70.  Pulse was  72.  CHEST:  Lungs reveal question of a few scattered rales or rhonchi.  NECK:  Normal.  HEENT:  No marked abnormalities.  CARDIAC:  S1 with an S2 but no clicks or significant murmurs.  ABDOMEN:  Obese.  She has no peripheral edema.  There are no significant  musculoskeletal deformities.   EKG reveals no significant abnormalities.  First troponin is normal.  A  chest x-ray report is not available to me at this time.   PROBLEMS:  1. History of diabetes.  2. Adrenal insufficiency.  The patient has been on steroids, and she is     being treated for this problem very nicely here at the hospital.  3. Current gastrointestinal illness, which is improving.  4. Rheumatoid arthritis.  5. Chronic prednisone therapy.  6. Gastroesophageal reflux disease.  7. Osteoporosis.  8. Psoriasis.  9. * Current chest pain.   The patient has had different types of chest pain over time.  This is why  her Cardiolite scan was ordered by Dr. Kristen Loader in April 2005.  She says  that last night's pain was different.  It lasted for many hours, and so far  enzymes and EKGs are normal.  At this point I am not convinced this  represents coronary disease.  However, with her multiple risk factors we  certainly must keep this in mind.   I will wait for further enzymes today and follow he EKGs.  As her GI status  completely stabilized, we will decide if the patient needs any further  inpatient evaluation or not.                                               Dola Argyle, M.D.    Shelby Walker  D:  06/19/2003  T:  06/19/2003  Job:  [id number]   cc:   Shelby Walker, M.D.  [place]  [place] [address]  Fax: Shelby Walker, M.D.  [address] [place]  [place] [address]  Fax: [employer]. Shelby Walker, M.D.  [address]  [place]  [place] [address]  Fax: [phone]

## 2010-07-21 NOTE — Discharge Summary (Signed)
NAME:  Shelby Walker, PINKSTAFF                             ACCOUNT NO.:  [id number]   MEDICAL RECORD NO.:  [id number]                   PATIENT TYPE:  INP   LOCATION:  [place]                                 FACILITY:  [facility]   PHYSICIAN:  Ishmael Holter. Forde Dandy, M.D.              DATE OF BIRTH:  12-22-1933   DATE OF ADMISSION:  06/17/2003  DATE OF DISCHARGE:  06/22/2003                                 DISCHARGE SUMMARY   DISCHARGE DIAGNOSES:  1. Nausea, vomiting, and fever, probable gastroenteritis, clinically     cleared.  2. Inter-hospital development of left upper lobe pneumonia, clinically     improved.  3. Chest pain with myocardial infarction ruled out and recent normal     Cardiolite.  4. Possible early fluid overload with minimally elevated BNP and no chest x-     ray evidence of congestive heart failure.  5. Type 2 diabetes, worsened by steroids, clinically improving.  6. Steroid dependent rheumatoid arthritis.  7. History of gastroesophageal reflux.  8. Osteoporosis, on treatment.  9. Hyperlipidemia.   CONSULTATIONS:  Cardiology, Dr. Dola Argyle.   PROCEDURES:  CT scan of the abdomen and pelvis at presentation.   HISTORY:  [name] is a 77 year old white female with longstanding  rheumatoid arthritis and fairly recently developed type 2 diabetes who  presented with nausea, vomiting, and diarrhea.  She was admitted initially,  hydrated, and sources of infection were looked for.  She was not treated  initially with antibiotics, and she defervesced and her leukocytosis  resolved.  She was improving and it was thought we might be able to let her  go home, and she developed a new cough, an infiltrate in the right upper  lobe which may have been due to aspiration after her vomiting, some evidence  of fluid overload, as well as chest pain.  She recently had a cardiac workup  with a negative Cardiolite just a couple of weeks before.  Cardiology saw  her here in the hospital, and has been  helping along during this time.  She  seems to have settled back down with no evidence of fluid overload at  present.  She had a good night last night with the exception of some  nocturia several times which she blamed on diuretics, and interestingly she  is not getting any diuretics at the present time.  She has had no fever in  several days.  Her labs have normalized fairly nicely.  She has had no  further chest pain.  Her sugars have come down as we weaned the steroids,  and she overall is improved.  Systolic blood pressure is [id number], diastolic of  70, pulse 62, respirations 16, temperature 97.6, blood sugar this morning is  140.  Most recent laboratory data this morning:  Chemistries showed a sodium  of 141, potassium 3.5, chloride 110, CO2 26, BUN 14, creatinine 0.7, glucose  142.  Calcium 7.5.  Brain natruretic peptide is 185.  On June 17, 2003, the  urine showed 40 of ketones, small blood, otherwise negative.  Specific  gravity was 1.03 with a pH of 6.  Earlier labs at presentation:  Sodium was  136, potassium 3.6, chloride 104, CO2 25, BUN 16, creatinine 1, glucose 170,  calcium 9.2, total protein 6.3, albumin 3.3.  Initial white count was  13,200, with 87 neutrophils and 9 lymphs, 5 monos, 2 eosinophils.  Her  hemoglobin was 12.9, MCV 86.3, platelets 183,000.  Liver function tests have  been normal starting with an AST of 20, ALT of 12, ALP of 44, total  bilirubin of 1.2.  CK's x3 were 84, 80, and 111, with the last having a  borderline relative index of 1.6.  Troponin's were normal at less than 0.01,  0.02, and 0.01.  Influenza A and B were screened at presentation and were  negative.  Blood cultures were ordered and obviously never done.  Chest x-  ray from June 21, 2003, shows generalized peribronchial thickening,  residual infiltrate, and/or peribronchial thickening in the left upper lung  field.  Also, somewhat greater atelectasis of the lower lung field,  flattening of the  hemidiaphragm, some increased AP diameter of the chest  suggesting COPD.  They recommended followup which will be scheduled.  On  June 19, 2003, there is left upper lobe air space disease representing  pneumonia.  CT scan of the abdomen and pelvis on June 17, 2003, showed  calcified granuloma of the left lobe of the liver, no significant  abnormalities of the liver.  Spleen was in upper limits of normal with no  focal abnormalities.  Gallbladder is unremarkable with no ductal dilatation.  The adrenal glands are small, but otherwise normal.  Kidneys are normal.  Small hiatal hernia was noted.  There is large and small bowel in the upper  abdomen looked normal, no free fluid.  There was enlargement of the left  ovarian vein made as a side note.  CT scan of the pelvis opaque material in  the rectosigmoid due to ingested medication.  Uterus and adnexa were  unremarkable.  Bladder grossly normal.  There was no adenopathy, no free  fluid.  There was a left ovarian varicocele which they said could cause  chronic pelvic pain.  The patient has had no such pain.  Echocardiogram with  sinus tachycardia and basically normal otherwise.  As noted, she had the  Cardiolite.  A copy of this was put in the chart here from June 10, 2003.  She had a hypertensive response, but otherwise she went 4 minutes 31 seconds  with no significant ectopy and no diagnostic findings of ischemia.   In summary, we have a 77 year old white female with known diabetes,  rheumatoid arthritis, steroid dependent, presenting with nausea, vomiting,  and diarrhea.  Initially, she had a viral gastroenteritis.  This appeared to  improve, and then she developed an intra-hospital pneumonia that may have  been due to aspiration.  Additionally, she has cardiac symptoms that were  again worked up and there was no evidence of significant fluid overload or myocardial injury.  She seems ready for discharge home.  An unanswered  question  would be whether she can continue to control her diabetes on oral  agents alone.  Her A1C's by memory have been in the 6 or below range, so she  has done well previously.  With a normal ejection fraction  and no evidence  of significant fluid overload here, I think we can continue on the Avandia  and Glucophage as we did before.  Obviously, if she develops anymore  congestive heart failure symptoms or a high BNP, this will have to be re-  evaluated.  Her Glucotrol will be continued as well.  Doses of  ___________four daily, Glucophage is 500 mg b.i.d., Glucotrol XL is 10 mg  b.i.d., Zocor is 40 mg daily, Fosamax 70 mg weekly, calcium with vitamin D  daily, methotrexate weekly.  Her prednisone will be at a dose of 15 mg for  an additional five days, then down to 10 mg for five more days, then back to  her baseline of 7.5 mg daily.  She will be on Nexium daily and aspirin  daily.  New medications include Vasotec 2.5 mg b.i.d.  She will have  followup with Dr. Ron Parker on Jul 05, 2003, at 9:30 a.m.  She will see me in the  next week to 10 days.   DIET:  As before.   PAIN MANAGEMENT:  As before.   WOUND CARE:  There are no wounds to dress.                                               Ishmael Holter. Forde Dandy, M.D.    SAS/MEDQ  D:  06/22/2003  T:  06/22/2003  Job:  [id number]

## 2010-07-21 NOTE — Discharge Summary (Signed)
NAME:  Shelby Walker, Shelby Walker                             ACCOUNT NO.:  [id number]   MEDICAL RECORD NO.:  [id number]                   PATIENT TYPE:  INP   LOCATION:  [place]                                 FACILITY:  [facility]   PHYSICIAN:  Ishmael Holter. Forde Dandy, M.D.              DATE OF BIRTH:  1933-07-25   DATE OF ADMISSION:  06/17/2003  DATE OF DISCHARGE:  06/22/2003                                 DISCHARGE SUMMARY   ADDENDUM:  Additional medication I left off the list was Avelox 400 mg once  daily for an additional seven days.  Also, repeat chest x-ray done in our  office.                                               Ishmael Holter. Forde Dandy, M.D.    SAS/MEDQ  D:  06/22/2003  T:  06/22/2003  Job:  [id number]

## 2010-07-21 NOTE — H&P (Signed)
NAME:  Shelby Walker, BUREAU                             ACCOUNT NO.:  [id number]   MEDICAL RECORD NO.:  [id number]                   PATIENT TYPE:  EMS   LOCATION:  ED                                   FACILITY:  [facility]   PHYSICIAN:  Ermalene Searing. Philip Aspen, M.D.            DATE OF BIRTH:  1934-01-22   DATE OF ADMISSION:  06/17/2003  DATE OF DISCHARGE:                                HISTORY & PHYSICAL   CHIEF COMPLAINT:  Nausea and vomiting.   HISTORY OF PRESENT ILLNESS:  The patient is a 77 year old white female, who  began to have episodes of nausea and vomiting on June 15, 2003.  Symptoms  began in the evening and continued the next day with multiple episodes of  greenish-yellow emesis with tactile temperatures.  She currently has mild  nausea with fatigue and tactile temperatures.  She presented to the [facility] Emergency Room for evaluation.  In the emergency room, she has been  treated with some IV fluids, Phenergan IV, Zofran IV, and Tylenol.   PAST MEDICAL HISTORY:  1. Rheumatoid arthritis for which she has taken daily prednisone for many     years.  2. She also has gastroesophageal reflux disease.  3. Diabetes mellitus, type 2.  4. Osteoporosis.  5. Psoriasis.  6. Presumed iatrogenic adrenal insufficiency.   MEDICATIONS PRIOR TO ADMISSION:  1. Multivitamin, 1 tab p.o. daily.  2. Nexium 40 mg p.o. daily.  3. Prednisone 5 mg p.o. q.a.m., 2.5 mg p.o. q.p.m.  4. Aspirin 1 tab p.o. daily.  5. Methotrexate 1 dose once weekly.  6. Glucophage 500 mg p.o. b.i.d.  7. Calcium with vitamin B t.i.d.  8. Fosamax 70 mg once weekly.  9. Imodium p.r.n. diarrhea.  10.      Glucotrol XL 10 mg p.o. b.i.d.  11.      Avandia 8 mg tabs 1/2 tab p.o. daily.  12.      Zocor 80 mg 1/2 tab p.o. daily.   ALLERGIES:  1. SULFA DRUGS.  2. COMPAZINE.  3. REMICADE.   PAST SURGICAL HISTORY:  1. In 1988, she had bilateral toe operations.  2. She also has a remote history of open reduction with internal  fixation of     a left arm fracture.   FAMILY HISTORY:  1. She has a strong family history of diabetes mellitus, type 2, present in     both parents.  2. She has no family history of breast cancer, colon cancer, or premature     coronary artery disease.   SOCIAL HISTORY:  She is married, and her husband is currently an inpatient  at [facility].  She has a son and a daughter.  She has no history of  tobacco or alcohol abuse.   REVIEW OF SYSTEMS:  Significant for current symptoms of fever, fatigue,  nausea, and vomiting.  She had mild diarrhea several days  ago but none  today.  She denies shortness of breath, productive cough, dysuria,  frequency.  She has chronic bilateral hand and feet pain but is able to walk  without a cane.  She has had occasional falls; the last one was yesterday.  She notes that since she has been having all of her nausea and vomiting, she  has been very lightheaded but denies vertigo.   PHYSICAL EXAMINATION:  VITAL SIGNS:  Blood pressure 122/61, pulse 102,  respirations 18, temperature 102.4.  GENERAL:  She is an elderly white female, who looked dehydrated and very  fatigued.  HEENT:  Pupils equal, round, and reactive to light and accommodation.  Extraocular movements are intact.  The oral mucosa was quite parched.  The  ear canals and tympanic membranes had a normal appearance.  NECK:  Supple and without jugular venous distension or carotid bruits.  CHEST:  Clear to auscultation.  HEART:  Regular rate and rhythm.  S1 and S2 were present without murmur,  gallop, or rub.  ABDOMEN:  Normal bowel sounds with no hepatosplenomegaly or tenderness.  EXTREMITIES:  Without cyanosis, clubbing, or edema.  SKIN:  Bilateral leg hyperpigmentation consistent with chronic venous  insufficiency.  NEUROLOGIC:  She is alert and oriented x 3.  Cranial nerves 2-12 were  normal.  She is able to move four extremities well, and she has intact light  touch in both legs.   Cerebellar function and gait were not assessed.   LABORATORY STUDIES:  White blood cell count 13.2 with 84% neutrophils.  Hemoglobin 12, hematocrit 38, platelet count 183.  Serum sodium 138,  potassium 3.6, chloride 104, CO2 25, BUN 18, creatinine 1.0, glucose 170.  Liver-associated enzymes normal.  Urinalysis was nitrite negative.  EKG  showed:  (1)  Sinus tachycardia (rate 102).  (2)  Otherwise, normal.  CT  scan of the abdomen and pelvis showed:  (1)  Small hiatal hernia.  (2)  Diverticulosis without diverticulitis changes.  (3)  Left-sided ovarian  varicocele.   IMPRESSION AND PLAN:  1. Nausea, vomiting, and fever:  This is most likely secondary to a viral     infection given her laboratory results, physical exam, and CT scan     findings.  We will check a rapid influenza test.  Other gastrointestinal     viruses could cause a similar picture.  In the morning, we will recheck a     CMET to rule out the unlikely possibility of gallbladder disease.  She     will be continued on moderate fluid replacement via IV and around-the-     clock antiemetics.  2. Adrenal insufficiency:  This presumed secondary to chronic prednisone     treatment.  Her prednisone will be changed to Solu-Medrol at moderate     doses q.i.d.  3. Diabetes mellitus, type 2.  This has been recently well-controlled on     oral medicines which will be switched to sliding-scale insulin while she     is an inpatient on higher dose corticosteroids.                                               Ermalene Searing Philip Aspen, M.D.    DGP/MEDQ  D:  06/17/2003  T:  06/17/2003  Job:  [id number]   cc:   Annie Main A. Forde Dandy, M.D.  [address]  [place]  [place] [address]  [phone]   Michael Litter, M.D.  [place]  [place] [address]  Fax: [place], M.D.  [address] [place]  [place] [address]  Fax: [phone]

## 2010-07-21 NOTE — Discharge Summary (Signed)
Shelby Walker, Shelby Walker                             ACCOUNT NO.:  [id number]   MEDICAL RECORD NO.:  [id number]                   PATIENT TYPE:  INP   LOCATION:  [id number]                                 FACILITY:  [facility]   PHYSICIAN:  Princess Bruins. Gaynell Face, M.D.           DATE OF BIRTH:  08-Sep-1933   DATE OF ADMISSION:  11/14/2001  DATE OF DISCHARGE:  11/15/2001                                 DISCHARGE SUMMARY   FINAL DIAGNOSES:  1. Headache, code 784.0, with nausea and vomiting.  I cannot rule out the     possibility of a viral syndrome as the etiology.  Migraine is also a     possibility.  2. Type 2 diabetes mellitus.   PROCEDURES:  1. Lumbar puncture.  2. MRI of brain.  3. MRA intracranial.   COMPLICATIONS:  None.   HOSPITAL COURSE:  The patient was admitted to the hospital with a severe  headache. See admission history for details.  Lumbar puncture was traumatic  even though it was easily done. There was no evidence of xanthochromia and  the tube #4 had considerably less red blood cells than tube #1.  The number  of white blood cells were not increased based on the red blood cells  present.   The patient had resolution of his symptoms and no longer complained of  nausea, vomiting, or headache.  MRI of the brain was carried out and was  normal.  MRA intracranial was normal and showed no evidence of an aneurysm.   LABORATORY DATA:  White count 6800, hemoglobin 12.6, hematocrit 37.2, MCV  84.8, platelet count 154,000; 59% neutrophils (absolute neutrophil count  4000), 26 lymphs, 13 monos, 2 eosinophils, 1 basophil. Sodium 137, potassium  3.7, chloride 104, CO2 26, glucose 123, BUN 13, creatinine 0.8, calcium 7.5,  total protein 5.8, albumin 2.7.  AST 27, ALT 19, ALP 57, total bilirubin  1.3, amylase 30, lipase 21.   Lumbar puncture carried out under fluoroscopy by Dr. Kathlene Cote.  The  supernatant was slightly hazy but was not xanthochromic.  There were 1610  red blood cells, 8 white  blood cells, a few segs, lymphs and rare monos on  the Cytospin.  Tube #4 had 290 red blood cells, 2 white blood cells,  supernatant was clear.  Few lymphs, rare segs and monos were seen.  Proteins  44, glucose 60.   Urinalysis showed specific gravity 1.019, pH 5.5, greater than 80 mg/dL of  ketones, 30 mg/dL of protein, 0 to 2 white blood cells, rare bacteria,  negative nitrite and leukocyte esterase, small bilirubin.   CONDITION ON DISCHARGE:  The patient was discharged in improved condition.   DISCHARGE MEDICATIONS:  1. Fosamax 70 mg once per week.  2. Glucotrol XL 10 mg b.i.d.  3. Prednisone 5 mg one in the morning and one half at nighttime.  4. Nexium 40 mg per day.  5.  Prenate vitamins one per day.  6. Calcium with vitamin D 600 mg two q.d.  7. Glucophage 500 mg one b.i.d.  8. Avandia 8 mg one half p.m.  9. Zetia 10 mg one in the p.m.  10.      Lantus insulin 20 units at bedtime.  11.      Methotrexate 0.28 every two weeks.  12.      Remicade 3 mg intervals.   ALLERGIES:  SULFA.   DISPOSITION:  The patient is discharged in improved condition.  I do not  believe he had an aneurysm or that he is at risk of having a subarachnoid  hemorrhage.  I appreciate the opportunity to see him and will see him in  follow-up in my office as needed.                                                Princess Bruins. Gaynell Face, M.D.    [facility]  D:  11/21/2001  T:  11/21/2001  Job:  [id number]   cc:   Annie Main A. Forde Dandy, M.D.  [address]  [place]  [place] [address]  Fax: [phone]

## 2010-11-24 ENCOUNTER — Other Ambulatory Visit: Payer: Self-pay | Admitting: Cardiology

## 2011-02-02 ENCOUNTER — Encounter: Payer: Self-pay | Admitting: *Deleted

## 2011-02-06 ENCOUNTER — Encounter: Payer: Self-pay | Admitting: Cardiology

## 2011-02-06 DIAGNOSIS — I779 Disorder of arteries and arterioles, unspecified: Secondary | ICD-10-CM | POA: Insufficient documentation

## 2011-02-06 DIAGNOSIS — R002 Palpitations: Secondary | ICD-10-CM | POA: Insufficient documentation

## 2011-02-06 DIAGNOSIS — K219 Gastro-esophageal reflux disease without esophagitis: Secondary | ICD-10-CM | POA: Insufficient documentation

## 2011-02-06 DIAGNOSIS — E119 Type 2 diabetes mellitus without complications: Secondary | ICD-10-CM | POA: Insufficient documentation

## 2011-02-06 DIAGNOSIS — R9430 Abnormal result of cardiovascular function study, unspecified: Secondary | ICD-10-CM | POA: Insufficient documentation

## 2011-02-06 DIAGNOSIS — I739 Peripheral vascular disease, unspecified: Secondary | ICD-10-CM

## 2011-02-06 DIAGNOSIS — R072 Precordial pain: Secondary | ICD-10-CM | POA: Insufficient documentation

## 2011-02-07 ENCOUNTER — Ambulatory Visit (INDEPENDENT_AMBULATORY_CARE_PROVIDER_SITE_OTHER): Payer: Medicare Other | Admitting: Cardiology

## 2011-02-07 ENCOUNTER — Encounter: Payer: Self-pay | Admitting: Cardiology

## 2011-02-07 VITALS — BP 132/60 | HR 57 | Ht 61.5 in | Wt 147.0 lb

## 2011-02-07 DIAGNOSIS — R079 Chest pain, unspecified: Secondary | ICD-10-CM

## 2011-02-07 DIAGNOSIS — I340 Nonrheumatic mitral (valve) insufficiency: Secondary | ICD-10-CM

## 2011-02-07 DIAGNOSIS — I059 Rheumatic mitral valve disease, unspecified: Secondary | ICD-10-CM

## 2011-02-07 DIAGNOSIS — R002 Palpitations: Secondary | ICD-10-CM

## 2011-02-07 NOTE — Assessment & Plan Note (Signed)
Patient has mild mitral regurgitation by history.  No further workup is needed.

## 2011-02-07 NOTE — Patient Instructions (Signed)
Your physician wants you to follow-up in:  6 months. You will receive a reminder letter in the mail two months in advance. If you don't receive a letter, please call our office to schedule the follow-up appointment.   

## 2011-02-07 NOTE — Progress Notes (Signed)
HPI   Patient is seen today to followup chest pain.  She has had some mild chest discomfort in the past.  Nuclear scan was done in May, 2008.  She had no scar or ischemia.  She's had some palpitations in the past.  She wore a monitor and there was question of very brief atrial fibrillation versus possible very brief reentrant tachycardia.  She is on both diltiazem and beta blocker and she has been very stable in this regard.  She did have one episode of some chest discomfort recently.  She thought she might have some tachycardia along with it.  This resolved and she's had no recurrence.  Prior to today's visit I had seen her last in December, 2011.  I have reviewed her old records completely and completely updated the new electronic medical record.  Allergies  Allergen Reactions  . Sulfonamide Derivatives     Current Outpatient Prescriptions  Medication Sig Dispense Refill  . aspirin 81 MG tablet Take 81 mg by mouth daily.        . Calcium Carbonate-Vitamin D (CALTRATE 600+D) 600-400 MG-UNIT per tablet Take 1 tablet by mouth daily.        . cholecalciferol (VITAMIN D) 400 UNITS TABS Take 400 Units by mouth daily.        Marland Kitchen diltiazem (CARDIZEM CD) 120 MG 24 hr capsule TAKE 1 CAPSULE BY MOUTH ONCE A DAY  30 capsule  5  . etanercept (ENBREL) 50 MG/ML injection Inject 50 mg into the skin every 21 ( twenty-one) days.        Marland Kitchen glipiZIDE (GLUCOTROL) 10 MG tablet Take 10 mg by mouth 2 (two) times daily before a meal.        . ibandronate (BONIVA) 150 MG tablet Take 150 mg by mouth every 30 (thirty) days. Take in the morning with a full glass of water, on an empty stomach, and do not take anything else by mouth or lie down for the next 30 min.       . lansoprazole (PREVACID) 15 MG capsule Take 15 mg by mouth daily.        . metFORMIN (GLUCOPHAGE) 1000 MG tablet Take 1,000 mg by mouth 2 (two) times daily with a meal.        . metoprolol succinate (TOPROL-XL) 25 MG 24 hr tablet Take 25 mg by mouth daily.         . nitroGLYCERIN (NITROSTAT) 0.4 MG SL tablet Place 0.4 mg under the tongue every 5 (five) minutes as needed.        . predniSONE (DELTASONE) 10 MG tablet Take 10 mg by mouth daily.        . Prenatal Multivit-Min-Fe-FA (PRE-NATAL FORMULA) TABS Take 1 tablet by mouth.          History   Social History  . Marital Status: Married    Spouse Name: N/A    Number of Children: N/A  . Years of Education: N/A   Occupational History  . Not on file.   Social History Main Topics  . Smoking status: Never Smoker   . Smokeless tobacco: Not on file  . Alcohol Use: Not on file  . Drug Use: Not on file  . Sexually Active: Yes   Other Topics Concern  . Not on file   Social History Narrative  . No narrative on file    No family history on file.  Past Medical History  Diagnosis Date  . Mitral regurgitation April 21, 2009  mild,  echo, February, 2011  . Chest pain, unspecified     Nuclear, May, 2008, no scar or ischemia  . Palpitations     possible very brief atrial fibrillation on monitor and possible reentrant tachycardia  . Diabetes mellitus   . Bruit     Carotid Doppler showed no significant abnormality     9per patient)  . Arthritis, rheumatoid   . GERD (gastroesophageal reflux disease)   . Osteoporosis   . Psoriasis   . Dyslipidemia   . Ejection fraction     EF 55-60%, echo, February, 2011    No past surgical history on file.  ROS   Patient denies fever, chills, headache, sweats, rash, change in vision, change in hearing, chest pain, cough, nausea vomiting, urinary symptoms.  All other systems are reviewed and are negative.  PHYSICAL EXAM  Patient is stable. She is oriented to person time and place.  Affect is normal.  Lungs are clear.  Respiratory effort is nonlabored.  Cardiac exam reveals an S1-S2.  There are no clicks or significant murmurs.  The abdomen is soft.  There is no peripheral edema.  She has musculoskeletal changes of her deforming rheumatoid  arthritis.  There are no skin rashes.  Filed Vitals:   02/07/11 1007  BP: 132/60  Pulse: 57  Height: 5' 1.5" (1.562 m)  Weight: 147 lb (66.679 kg)    EKG   EKG is done today and reviewed by me.  There is sinus bradycardia.  There is no significant change.  ASSESSMENT & PLAN

## 2011-02-07 NOTE — Assessment & Plan Note (Signed)
The patient did have one episode of chest pain.  She has no EKG changes.  With her rheumatoid arthritis she has increased risk of having coronary disease but this has never been proven in the past.  I chosen not to push for followup nuclear scan at this time but I will consider this when I talk to her soon.

## 2011-02-20 ENCOUNTER — Encounter: Payer: Self-pay | Admitting: Cardiology

## 2011-02-20 NOTE — Progress Notes (Signed)
I had seen the patient a few weeks ago when she was here for a visit. She had some chest discomfort. I decided to watch her and talk to her when she came back with her husband today for his visit. She has not had any recurring pain. I decided to see her again in 2 months and decide then if we should proceed with more complete ischemic workup.

## 2011-03-16 DIAGNOSIS — S93609A Unspecified sprain of unspecified foot, initial encounter: Secondary | ICD-10-CM | POA: Diagnosis not present

## 2011-03-16 DIAGNOSIS — S93409A Sprain of unspecified ligament of unspecified ankle, initial encounter: Secondary | ICD-10-CM | POA: Diagnosis not present

## 2011-03-29 DIAGNOSIS — M069 Rheumatoid arthritis, unspecified: Secondary | ICD-10-CM | POA: Diagnosis not present

## 2011-04-13 ENCOUNTER — Encounter: Payer: Self-pay | Admitting: Cardiology

## 2011-04-13 ENCOUNTER — Ambulatory Visit (INDEPENDENT_AMBULATORY_CARE_PROVIDER_SITE_OTHER): Payer: Medicare Other | Admitting: Cardiology

## 2011-04-13 DIAGNOSIS — R079 Chest pain, unspecified: Secondary | ICD-10-CM

## 2011-04-13 DIAGNOSIS — R002 Palpitations: Secondary | ICD-10-CM

## 2011-04-13 NOTE — Progress Notes (Signed)
HPI Patient is seen for followup of chest pain. Historically she has no proven coronary disease. She does have significant rheumatoid arthritis. She had a nuclear scan done in the past showing no ischemia. She had some pain recently and I decided to see her for followup. She has had no recurrent pain. Also she's not had any significant palpitations. Allergies  Allergen Reactions  . Sulfonamide Derivatives     Current Outpatient Prescriptions  Medication Sig Dispense Refill  . aspirin 81 MG tablet Take 81 mg by mouth daily.        . Calcium Carbonate-Vitamin D (CALTRATE 600+D) 600-400 MG-UNIT per tablet Take 1 tablet by mouth daily.        . cholecalciferol (VITAMIN D) 400 UNITS TABS Take 400 Units by mouth daily.        Marland Kitchen diltiazem (CARDIZEM CD) 120 MG 24 hr capsule TAKE 1 CAPSULE BY MOUTH ONCE A DAY  30 capsule  5  . etanercept (ENBREL) 50 MG/ML injection Inject 50 mg into the skin every 14 (fourteen) days.       Marland Kitchen glipiZIDE (GLUCOTROL) 10 MG tablet Take 10 mg by mouth 2 (two) times daily before a meal.        . ibandronate (BONIVA) 150 MG tablet Take 150 mg by mouth every 30 (thirty) days. Take in the morning with a full glass of water, on an empty stomach, and do not take anything else by mouth or lie down for the next 30 min.       Marland Kitchen KOMBIGLYZE XR 2.07-998 MG TB24 Take 0.5 tablets by mouth at bedtime.      . lansoprazole (PREVACID) 15 MG capsule Take 15 mg by mouth daily.        Marland Kitchen LIVALO 2 MG TABS Take 1 tablet by mouth Once a week.      . metFORMIN (GLUCOPHAGE) 1000 MG tablet Take 1,000 mg by mouth daily with breakfast.       . metoprolol succinate (TOPROL-XL) 25 MG 24 hr tablet Take 25 mg by mouth daily.        . nitroGLYCERIN (NITROSTAT) 0.4 MG SL tablet Place 0.4 mg under the tongue every 5 (five) minutes as needed.        . predniSONE (DELTASONE) 10 MG tablet Take 5 mg by mouth daily.       . Prenatal Multivit-Min-Fe-FA (PRE-NATAL FORMULA) TABS Take 1 tablet by mouth.           History   Social History  . Marital Status: Married    Spouse Name: N/A    Number of Children: N/A  . Years of Education: N/A   Occupational History  . Not on file.   Social History Main Topics  . Smoking status: Never Smoker   . Smokeless tobacco: Not on file  . Alcohol Use: Not on file  . Drug Use: Not on file  . Sexually Active: Yes   Other Topics Concern  . Not on file   Social History Narrative  . No narrative on file    No family history on file.  Past Medical History  Diagnosis Date  . Mitral regurgitation April 21, 2009    mild,  echo, February, 2011  . Chest pain, unspecified     Nuclear, May, 2008, no scar or ischemia  . Palpitations     possible very brief atrial fibrillation on monitor and possible reentrant tachycardia  . Diabetes mellitus   . Bruit     Carotid  Doppler showed no significant abnormality     9per patient)  . Arthritis, rheumatoid   . GERD (gastroesophageal reflux disease)   . Osteoporosis   . Psoriasis   . Dyslipidemia   . Ejection fraction     EF 55-60%, echo, February, 2011    No past surgical history on file.  ROS  Patient denies fever, chills, headache, sweats, rash, change in vision, change in hearing, chest pain, cough, nausea vomiting, urinary symptoms. All other systems are reviewed and are negative.  PHYSICAL EXAM Patient is stable. She is oriented to person time and place. Affect is normal. She shared with me that she's worried about her husband in that he may have some depression. I encouraged her to have him see his primary physician. Lungs are clear. Respiratory effort is nonlabored. There is no jugular venous distention. Cardiac exam reveals S1 and S2. There no clicks or significant murmurs. The abdomen is soft. The patient has significant hand changes that are classic for rheumatoid arthritis. Filed Vitals:   04/13/11 1035  BP: 104/52  Pulse: 75  Height: 5\' 1"  (1.549 m)  Weight: 143 lb (64.864 kg)     ASSESSMENT & PLAN

## 2011-04-13 NOTE — Assessment & Plan Note (Signed)
She has not been having any significant recurrent palpitations. No further workup.

## 2011-04-13 NOTE — Patient Instructions (Signed)
Your physician wants you to follow-up in:  12 months.  You will receive a reminder letter in the mail two months in advance. If you don't receive a letter, please call our office to schedule the follow-up appointment.   

## 2011-04-13 NOTE — Assessment & Plan Note (Signed)
The patient is not having any recurrent significant chest pain. No further workup is needed.

## 2011-06-02 ENCOUNTER — Other Ambulatory Visit: Payer: Self-pay | Admitting: Cardiology

## 2011-06-18 DIAGNOSIS — M069 Rheumatoid arthritis, unspecified: Secondary | ICD-10-CM | POA: Diagnosis not present

## 2011-07-02 DIAGNOSIS — E1129 Type 2 diabetes mellitus with other diabetic kidney complication: Secondary | ICD-10-CM | POA: Diagnosis not present

## 2011-07-02 DIAGNOSIS — E559 Vitamin D deficiency, unspecified: Secondary | ICD-10-CM | POA: Diagnosis not present

## 2011-07-02 DIAGNOSIS — N058 Unspecified nephritic syndrome with other morphologic changes: Secondary | ICD-10-CM | POA: Diagnosis not present

## 2011-07-02 DIAGNOSIS — E785 Hyperlipidemia, unspecified: Secondary | ICD-10-CM | POA: Diagnosis not present

## 2011-08-02 DIAGNOSIS — M069 Rheumatoid arthritis, unspecified: Secondary | ICD-10-CM | POA: Diagnosis not present

## 2011-08-10 DIAGNOSIS — L408 Other psoriasis: Secondary | ICD-10-CM | POA: Diagnosis not present

## 2011-08-10 DIAGNOSIS — L259 Unspecified contact dermatitis, unspecified cause: Secondary | ICD-10-CM | POA: Diagnosis not present

## 2011-10-07 ENCOUNTER — Other Ambulatory Visit: Payer: Self-pay | Admitting: Cardiology

## 2011-10-08 NOTE — Telephone Encounter (Signed)
Fax Received. Refill Completed. Shelby Walker (R.M.A)   

## 2011-10-15 DIAGNOSIS — M069 Rheumatoid arthritis, unspecified: Secondary | ICD-10-CM | POA: Diagnosis not present

## 2011-10-19 DIAGNOSIS — M069 Rheumatoid arthritis, unspecified: Secondary | ICD-10-CM | POA: Diagnosis not present

## 2011-10-19 DIAGNOSIS — N058 Unspecified nephritic syndrome with other morphologic changes: Secondary | ICD-10-CM | POA: Diagnosis not present

## 2011-10-19 DIAGNOSIS — E1149 Type 2 diabetes mellitus with other diabetic neurological complication: Secondary | ICD-10-CM | POA: Diagnosis not present

## 2011-10-19 DIAGNOSIS — R0989 Other specified symptoms and signs involving the circulatory and respiratory systems: Secondary | ICD-10-CM | POA: Diagnosis not present

## 2011-11-02 DIAGNOSIS — Z23 Encounter for immunization: Secondary | ICD-10-CM | POA: Diagnosis not present

## 2011-11-23 DIAGNOSIS — R05 Cough: Secondary | ICD-10-CM | POA: Diagnosis not present

## 2011-11-23 DIAGNOSIS — R0602 Shortness of breath: Secondary | ICD-10-CM | POA: Diagnosis not present

## 2011-11-23 DIAGNOSIS — J209 Acute bronchitis, unspecified: Secondary | ICD-10-CM | POA: Diagnosis not present

## 2011-11-30 DIAGNOSIS — R05 Cough: Secondary | ICD-10-CM | POA: Diagnosis not present

## 2011-11-30 DIAGNOSIS — J209 Acute bronchitis, unspecified: Secondary | ICD-10-CM | POA: Diagnosis not present

## 2011-11-30 DIAGNOSIS — R0602 Shortness of breath: Secondary | ICD-10-CM | POA: Diagnosis not present

## 2011-12-09 ENCOUNTER — Other Ambulatory Visit: Payer: Self-pay | Admitting: Cardiology

## 2011-12-10 ENCOUNTER — Other Ambulatory Visit: Payer: Self-pay | Admitting: *Deleted

## 2011-12-10 MED ORDER — DILTIAZEM HCL ER COATED BEADS 120 MG PO CP24: 120.0000 mg | ORAL_CAPSULE | Freq: Every day | ORAL | Status: DC

## 2011-12-10 NOTE — Telephone Encounter (Signed)
Refilled Diltiazem. 

## 2011-12-31 DIAGNOSIS — M069 Rheumatoid arthritis, unspecified: Secondary | ICD-10-CM | POA: Diagnosis not present

## 2011-12-31 DIAGNOSIS — K117 Disturbances of salivary secretion: Secondary | ICD-10-CM | POA: Diagnosis not present

## 2012-02-18 DIAGNOSIS — E785 Hyperlipidemia, unspecified: Secondary | ICD-10-CM | POA: Diagnosis not present

## 2012-02-18 DIAGNOSIS — E1129 Type 2 diabetes mellitus with other diabetic kidney complication: Secondary | ICD-10-CM | POA: Diagnosis not present

## 2012-02-18 DIAGNOSIS — M069 Rheumatoid arthritis, unspecified: Secondary | ICD-10-CM | POA: Diagnosis not present

## 2012-02-18 DIAGNOSIS — E559 Vitamin D deficiency, unspecified: Secondary | ICD-10-CM | POA: Diagnosis not present

## 2012-03-31 DIAGNOSIS — K117 Disturbances of salivary secretion: Secondary | ICD-10-CM | POA: Diagnosis not present

## 2012-03-31 DIAGNOSIS — M069 Rheumatoid arthritis, unspecified: Secondary | ICD-10-CM | POA: Diagnosis not present

## 2012-04-03 DIAGNOSIS — M25579 Pain in unspecified ankle and joints of unspecified foot: Secondary | ICD-10-CM | POA: Diagnosis not present

## 2012-04-24 DIAGNOSIS — S93409A Sprain of unspecified ligament of unspecified ankle, initial encounter: Secondary | ICD-10-CM | POA: Diagnosis not present

## 2012-04-24 DIAGNOSIS — M25579 Pain in unspecified ankle and joints of unspecified foot: Secondary | ICD-10-CM | POA: Diagnosis not present

## 2012-05-09 ENCOUNTER — Emergency Department (HOSPITAL_COMMUNITY): Payer: Medicare Other

## 2012-05-09 ENCOUNTER — Emergency Department (HOSPITAL_COMMUNITY)
Admission: EM | Admit: 2012-05-09 | Discharge: 2012-05-09 | Disposition: A | Discharge status: Home / Self Care | Payer: Medicare Other | Attending: Emergency Medicine | Admitting: Emergency Medicine

## 2012-05-09 ENCOUNTER — Encounter (HOSPITAL_COMMUNITY): Payer: Self-pay

## 2012-05-09 DIAGNOSIS — M810 Age-related osteoporosis without current pathological fracture: Secondary | ICD-10-CM | POA: Insufficient documentation

## 2012-05-09 DIAGNOSIS — E119 Type 2 diabetes mellitus without complications: Secondary | ICD-10-CM | POA: Insufficient documentation

## 2012-05-09 DIAGNOSIS — Z7982 Long term (current) use of aspirin: Secondary | ICD-10-CM | POA: Diagnosis not present

## 2012-05-09 DIAGNOSIS — Z872 Personal history of diseases of the skin and subcutaneous tissue: Secondary | ICD-10-CM | POA: Diagnosis not present

## 2012-05-09 DIAGNOSIS — S52209A Unspecified fracture of shaft of unspecified ulna, initial encounter for closed fracture: Secondary | ICD-10-CM | POA: Insufficient documentation

## 2012-05-09 DIAGNOSIS — Y9289 Other specified places as the place of occurrence of the external cause: Secondary | ICD-10-CM | POA: Insufficient documentation

## 2012-05-09 DIAGNOSIS — Z79899 Other long term (current) drug therapy: Secondary | ICD-10-CM | POA: Diagnosis not present

## 2012-05-09 DIAGNOSIS — Y939 Activity, unspecified: Secondary | ICD-10-CM | POA: Insufficient documentation

## 2012-05-09 DIAGNOSIS — Z8739 Personal history of other diseases of the musculoskeletal system and connective tissue: Secondary | ICD-10-CM | POA: Diagnosis not present

## 2012-05-09 DIAGNOSIS — Z8639 Personal history of other endocrine, nutritional and metabolic disease: Secondary | ICD-10-CM | POA: Insufficient documentation

## 2012-05-09 DIAGNOSIS — K219 Gastro-esophageal reflux disease without esophagitis: Secondary | ICD-10-CM | POA: Insufficient documentation

## 2012-05-09 DIAGNOSIS — W010XXA Fall on same level from slipping, tripping and stumbling without subsequent striking against object, initial encounter: Secondary | ICD-10-CM | POA: Insufficient documentation

## 2012-05-09 DIAGNOSIS — Z862 Personal history of diseases of the blood and blood-forming organs and certain disorders involving the immune mechanism: Secondary | ICD-10-CM | POA: Diagnosis not present

## 2012-05-09 DIAGNOSIS — S52009A Unspecified fracture of upper end of unspecified ulna, initial encounter for closed fracture: Secondary | ICD-10-CM | POA: Diagnosis not present

## 2012-05-09 DIAGNOSIS — Z8679 Personal history of other diseases of the circulatory system: Secondary | ICD-10-CM | POA: Insufficient documentation

## 2012-05-09 DIAGNOSIS — S52202A Unspecified fracture of shaft of left ulna, initial encounter for closed fracture: Secondary | ICD-10-CM

## 2012-05-09 MED ORDER — HYDROCODONE-ACETAMINOPHEN 5-325 MG PO TABS
2.0000 | ORAL_TABLET | Freq: Once | ORAL | Status: AC
  Administered 2012-05-09: 2 via ORAL
  Filled 2012-05-09: qty 2

## 2012-05-09 MED ORDER — HYDROCODONE-ACETAMINOPHEN 5-325 MG PO TABS: 2.0000 | ORAL_TABLET | ORAL | Status: DC | PRN

## 2012-05-09 MED ORDER — ONDANSETRON HCL 4 MG PO TABS: 4.0000 mg | ORAL_TABLET | Freq: Four times a day (QID) | ORAL | Status: DC

## 2012-05-09 MED ORDER — ONDANSETRON 4 MG PO TBDP
4.0000 mg | ORAL_TABLET | Freq: Once | ORAL | Status: AC
Start: 2012-05-09 — End: 2012-05-09
  Administered 2012-05-09: 4 mg via ORAL
  Filled 2012-05-09: qty 1

## 2012-05-09 NOTE — ED Provider Notes (Signed)
History     CSN: [id number]  Arrival date & time 05/09/12  1919   First MD Initiated Contact with Patient 05/09/12 1937      Chief Complaint  Patient presents with  . Fall    (Consider location/radiation/quality/duration/timing/severity/associated sxs/prior treatment) HPI Comments: Patient presents with left arm pain after a fall at the mailbox. She states she slipped on the ice and landed on her left arm around 545. She did not hit her head or lose consciousness. She denies any headache, neck pain, chest pain, abdominal pain. She has pain in her left elbow and proximal forearm. Denies any weakness. No open wounds. No dizziness or lightheadedness.  The history is provided by the patient.    Past Medical History  Diagnosis Date  . Mitral regurgitation April 21, 2009    mild,  echo, February, 2011  . Chest pain, unspecified     Nuclear, May, 2008, no scar or ischemia  . Palpitations     possible very brief atrial fibrillation on monitor and possible reentrant tachycardia  . Diabetes mellitus   . Bruit     Carotid Doppler showed no significant abnormality     9per patient)  . Arthritis, rheumatoid   . GERD (gastroesophageal reflux disease)   . Osteoporosis   . Psoriasis   . Dyslipidemia   . Ejection fraction     EF 55-60%, echo, February, 2011    History reviewed. No pertinent past surgical history.  No family history on file.  History  Substance Use Topics  . Smoking status: Never Smoker   . Smokeless tobacco: Not on file  . Alcohol Use: Not on file    OB History   Grav Para Term Preterm Abortions TAB SAB Ect Mult Living                  Review of Systems  Constitutional: Negative for fever, activity change and appetite change.  HENT: Negative for congestion and rhinorrhea.   Respiratory: Negative for cough, chest tightness and shortness of breath.   Cardiovascular: Negative for chest pain.  Gastrointestinal: Negative for nausea, vomiting and abdominal  pain.  Genitourinary: Negative for vaginal bleeding and vaginal discharge.  Musculoskeletal: Positive for myalgias and arthralgias.  Skin: Negative for rash.  Neurological: Negative for dizziness, weakness and headaches.  A complete 10 system review of systems was obtained and all systems are negative except as noted in the HPI and PMH.    Allergies  Sulfonamide derivatives  Home Medications   Current Outpatient Rx  Name  Route  Sig  Dispense  Refill  . aspirin 81 MG tablet   Oral   Take 81 mg by mouth daily.           . Calcium Carbonate-Vitamin D (CALTRATE 600+D) 600-400 MG-UNIT per tablet   Oral   Take 1 tablet by mouth daily.           . cholecalciferol (VITAMIN D) 400 UNITS TABS   Oral   Take 400 Units by mouth daily.           Marland Kitchen diltiazem (CARDIZEM CD) 120 MG 24 hr capsule   Oral   Take 1 capsule (120 mg total) by mouth daily.   30 capsule   5   . etanercept (ENBREL) 50 MG/ML injection   Subcutaneous   Inject 50 mg into the skin every 14 (fourteen) days.          Marland Kitchen glipiZIDE (GLUCOTROL) 10 MG tablet  Oral   Take 10 mg by mouth 2 (two) times daily before a meal.           . ibandronate (BONIVA) 150 MG tablet   Oral   Take 150 mg by mouth every 30 (thirty) days. Take in the morning with a full glass of water, on an empty stomach, and do not take anything else by mouth or lie down for the next 30 min.          Marland Kitchen KOMBIGLYZE XR 2.07-998 MG TB24   Oral   Take 0.5 tablets by mouth at bedtime.         . lansoprazole (PREVACID) 15 MG capsule   Oral   Take 15 mg by mouth daily.           Marland Kitchen LIVALO 2 MG TABS   Oral   Take 1 tablet by mouth Once a week.         . metFORMIN (GLUCOPHAGE) 1000 MG tablet   Oral   Take 1,000 mg by mouth daily with breakfast.          . metoprolol succinate (TOPROL-XL) 25 MG 24 hr tablet   Oral   Take 25 mg by mouth daily.           Marland Kitchen NITROSTAT 0.4 MG SL tablet      1 TABLET UNDER TONGUE AT ONSET OF CHEST  PAIN YOU MAY REPEAT EVERY 5 MINUTES FOR UP TO 3 DOSES.   25 tablet   5   . predniSONE (DELTASONE) 10 MG tablet   Oral   Take 5 mg by mouth daily.          . Prenatal Multivit-Min-Fe-FA (PRE-NATAL FORMULA) TABS   Oral   Take 1 tablet by mouth.             BP 147/55  Pulse 80  Temp(Src) 98 F (36.7 C) (Oral)  Ht 5' 1.5" (1.562 m)  Wt 142 lb (64.411 kg)  BMI 26.4 kg/m2  SpO2 97%  Physical Exam  Constitutional: She is oriented to person, place, and time. She appears well-developed and well-nourished. No distress.  HENT:  Head: Normocephalic and atraumatic.  Mouth/Throat: Oropharynx is clear and moist.  Eyes: Conjunctivae and EOM are normal. Pupils are equal, round, and reactive to light.  Neck: Normal range of motion. Neck supple.  No C spine pain, stepoff or deformity  Cardiovascular: Normal rate, regular rhythm and normal heart sounds.   No murmur heard. Pulmonary/Chest: Effort normal and breath sounds normal. No respiratory distress.  Abdominal: Soft. Bowel sounds are normal. There is no tenderness. There is no rebound and no guarding.  Musculoskeletal: Normal range of motion. She exhibits tenderness.  Tender to palpation over the left elbow and dorsal proximal forearm. There is no open wound. There is no deformity. +2 radial pulse. Reduced range of motion secondary to pain. Pain with flexion and extension and pronation and supination.  Neurological: She is alert and oriented to person, place, and time. No cranial nerve deficit. She exhibits normal muscle tone. Coordination normal.  Skin: Skin is warm.    ED Course  Procedures (including critical care time)  Labs Reviewed - No data to display Dg Elbow 2 Views Left  05/09/2012  *RADIOLOGY REPORT*  Clinical Data: Fall, left forearm and elbow pain  LEFT ELBOW - 2 VIEW  Comparison: Concurrently obtained radiographs the forearm  Findings: Acute nondisplaced fracture through the proximal ulnar diaphysis.  Surgical changes of  prior radial  head resection are noted.  There are degenerative changes about the elbow.  The bones are osteopenic.   IMPRESSION:  1.  Acute nondisplaced fracture through the proximal ulnar diaphysis.  2.  Surgical changes of prior radial head resection.  There are degenerative changes about the elbow.   Original Report Authenticated By: Jacqulynn Cadet, M.D.    Dg Forearm Left  05/09/2012  *RADIOLOGY REPORT*  Clinical Data: Fall, left forearm and elbow pain  LEFT FOREARM - 2 VIEW  Comparison: Concurrently obtained radiographs of the elbow  Findings: Acute nondisplaced fracture through the proximal ulnar diaphysis.  Surgical changes of prior radial head resection are noted.  There are degenerative changes about the elbow.  The bones are osteopenic.  Probable nonunion of a remote healed ulnar styloid fracture noted distally.  Remote healed distal radius fracture.  IMPRESSION:  1.  Acute nondisplaced fracture through the proximal ulnar diaphysis. 2.  Remote healed distal radius fracture and the nonunion of a healed ulnar styloid fracture. 3.  Surgical changes of prior radial head resection.  There are degenerative changes about the elbow.   Original Report Authenticated By: Jacqulynn Cadet, M.D.      No diagnosis found.    MDM  Mechanical fall with left arm and elbow pain. No loss of consciousness. Vitals stable.  Imaging remarkable for ulnar diaphysis fracture. Intact distal pulses in cardinal hand movements.  Discussed with orthopedics Dr. Tamera Punt. Will place splint in position of comfort. Will followup in the office.      Ezequiel Essex, MD 05/09/12 2230

## 2012-05-09 NOTE — Progress Notes (Signed)
Orthopedic Tech Progress Note Patient Details:  Shelby Walker [birth year] [id number]  Ortho Devices Type of Ortho Device: Arm sling;Ace wrap;Long arm splint Ortho Device/Splint Location: left arm Ortho Device/Splint Interventions: Application   Crawford, Rembert 05/09/2012, 10:07 PM

## 2012-05-09 NOTE — ED Notes (Signed)
Pt. Fell  At 17:45 onto her lt. Arm swelling . Lower forearm  Decreased ROM and increased pain to lt. Upper arm. +radial pulse  Denies hitting her head denies any LOC

## 2012-05-09 NOTE — ED Notes (Signed)
Paged ortho tech for long arm sling.

## 2012-05-09 NOTE — ED Notes (Signed)
Pt back from x-ray.

## 2012-05-12 DIAGNOSIS — M25539 Pain in unspecified wrist: Secondary | ICD-10-CM | POA: Diagnosis not present

## 2012-05-12 DIAGNOSIS — S52209A Unspecified fracture of shaft of unspecified ulna, initial encounter for closed fracture: Secondary | ICD-10-CM | POA: Diagnosis not present

## 2012-05-19 DIAGNOSIS — M25539 Pain in unspecified wrist: Secondary | ICD-10-CM | POA: Diagnosis not present

## 2012-05-20 ENCOUNTER — Telehealth: Payer: Self-pay

## 2012-05-20 NOTE — Telephone Encounter (Signed)
Requesting surgical clearance for an ORIF ulna fracture on 05/22/12.

## 2012-05-20 NOTE — Telephone Encounter (Signed)
Follow-up:    Patient called in returning your call to confirm that she will be in on 05/21/12 @ 11:45.

## 2012-05-20 NOTE — Telephone Encounter (Signed)
Preop appt scheduled with Amie Portland PA-C on 05/21/12.  Pt was notified.

## 2012-05-21 ENCOUNTER — Ambulatory Visit (INDEPENDENT_AMBULATORY_CARE_PROVIDER_SITE_OTHER): Payer: Medicare Other | Admitting: Physician Assistant

## 2012-05-21 ENCOUNTER — Encounter: Payer: Self-pay | Admitting: Physician Assistant

## 2012-05-21 ENCOUNTER — Ambulatory Visit: Payer: Medicare Other | Admitting: Physician Assistant

## 2012-05-21 VITALS — BP 140/60 | Ht 61.5 in | Wt 139.0 lb

## 2012-05-21 DIAGNOSIS — Z01818 Encounter for other preprocedural examination: Secondary | ICD-10-CM | POA: Diagnosis not present

## 2012-05-21 DIAGNOSIS — R0989 Other specified symptoms and signs involving the circulatory and respiratory systems: Secondary | ICD-10-CM

## 2012-05-21 DIAGNOSIS — R002 Palpitations: Secondary | ICD-10-CM

## 2012-05-21 DIAGNOSIS — R9430 Abnormal result of cardiovascular function study, unspecified: Secondary | ICD-10-CM

## 2012-05-21 DIAGNOSIS — R079 Chest pain, unspecified: Secondary | ICD-10-CM | POA: Diagnosis not present

## 2012-05-21 NOTE — Progress Notes (Signed)
HPI:   This is a very pleasant 79 year old white female patient who is here for presurgical clearance after slipping on the ice and breaking her left arm. She is scheduled for surgery tomorrow to have a plate put in by Dr. Tamera Punt with [facility].  She last saw Dr. Ron Parker in February 2013 and was doing well. She has a history of chest pain but has no proven coronary disease. Nuclear scan in 2008 showed no ischemia. She also has a history of palpitations with possible brief atrial fibrillation on monitor and possible reentrant tachycardia, but has had no symptoms since she was placed on diltiazem and beta blocker. She denies any chest pain, significant palpitations, dyspnea, dizziness, or presyncope.   Allergies: -- Sulfonamide Derivatives   Current Outpatient Prescriptions on File Prior to Visit: aspirin 81 MG tablet, Take 81 mg by mouth daily.  , Disp: , Rfl:  Calcium Carbonate-Vitamin D (CALTRATE 600+D) 600-400 MG-UNIT per tablet, Take 1 tablet by mouth daily.  , Disp: , Rfl:  cholecalciferol (VITAMIN D) 400 UNITS TABS, Take 400 Units by mouth daily.  , Disp: , Rfl:  diltiazem (CARDIZEM CD) 120 MG 24 hr capsule, Take 1 capsule (120 mg total) by mouth daily., Disp: 30 capsule, Rfl: 5 etanercept (ENBREL) 50 MG/ML injection, Inject 50 mg into the skin once a week. , Disp: , Rfl:  glipiZIDE (GLUCOTROL) 10 MG tablet, Take 10 mg by mouth 2 (two) times daily before a meal.  , Disp: , Rfl:  ibandronate (BONIVA) 150 MG tablet, Take 150 mg by mouth every 30 (thirty) days. Take in the morning with a full glass of water, on an empty stomach, and do not take anything else by mouth or lie down for the next 30 min. , Disp: , Rfl:  KOMBIGLYZE XR 2.07-998 MG TB24, Take 0.5 tablets by mouth at bedtime., Disp: , Rfl:  lansoprazole (PREVACID) 15 MG capsule, Take 15 mg by mouth daily.  , Disp: , Rfl:  LIVALO 2 MG TABS, Take 1 tablet by mouth Once a week., Disp: , Rfl:  metFORMIN (GLUCOPHAGE) 1000 MG  tablet, Take 500 mg by mouth daily with breakfast. , Disp: , Rfl:  metoprolol succinate (TOPROL-XL) 25 MG 24 hr tablet, Take 25 mg by mouth daily.  , Disp: , Rfl:  predniSONE (DELTASONE) 10 MG tablet, Take 5 mg by mouth daily. , Disp: , Rfl:  Prenatal Multivit-Min-Fe-FA (PRE-NATAL FORMULA) TABS, Take 1 tablet by mouth.  , Disp: , Rfl:  NITROSTAT 0.4 MG SL tablet, 1 TABLET UNDER TONGUE AT ONSET OF CHEST PAIN YOU MAY REPEAT EVERY 5 MINUTES FOR UP TO 3 DOSES., Disp: 25 tablet, Rfl: 5  No current facility-administered medications on file prior to visit.   Past Medical History:   Mitral regurgitation                            February *     Comment:mild,  echo, February, 2011   Chest pain, unspecified                                        Comment:Nuclear, May, 2008, no scar or ischemia   Palpitations  Comment:possible very brief atrial fibrillation on               monitor and possible reentrant tachycardia   Diabetes mellitus                                            Bruit                                                          Comment:Carotid Doppler showed no significant               abnormality     9per patient)   Arthritis, rheumatoid                                        GERD (gastroesophageal reflux disease)                       Osteoporosis                                                 Psoriasis                                                    Dyslipidemia                                                 Ejection fraction                                              Comment:EF 55-60%, echo, February, 2011  No past surgical history on file.  No family history on file.   Social History   Marital Status: Married             Spouse Name:                      Years of Education:                 Number of children:             Occupational History   None on file  Social History Main Topics   Smoking Status: Never Smoker                      Smokeless Status: Not on file                      Alcohol Use: Not on file    Drug Use: Not on file    Sexual  Activity: Yes                Other Topics            Concern   None on file  Social History Narrative   None on file    [id number] rheumatoid arthritis, otherwise see history of present illness   PHYSICAL EXAM: Well-nournished, in no acute distress. Neck: No JVD, HJR, Bruit, or thyroid enlargement  Lungs: No tachypnea, clear without wheezing, rales, or rhonchi  Cardiovascular: RRR, PMI not displaced, 2/6 systolic murmur at the left sternal border, no gallops, bruit, thrill, or heave.  Abdomen: BS normal. Soft without organomegaly, masses, lesions or tenderness.  Extremities:left arm in sling, lower extremities without cyanosis, clubbing or edema. Good distal pulses bilateral  SKin: Warm, no lesions or rashes   Musculoskeletal: No deformities  Neuro: no focal signs  BP 140/60  Ht 5' 1.5" (1.562 m)  Wt 139 lb (63.05 kg)  BMI 25.84 kg/m2   [id number] sinus rhythm, normal EKG

## 2012-05-21 NOTE — Assessment & Plan Note (Signed)
Patient slipped on the ice and broke her left arm and is scheduled for surgery tomorrow by Dr. Tamera Punt. I discussed this patient in detail with Dr. Dola Argyle. She has no documented coronary disease. She has had chest pain over the years but none recently. Last Myoview scan was in 2008. Palpitations are treated with diltiazem. She had a brief episode of atrial fibrillation years ago and possible reentrant tachycardia but has not had symptoms in a long time. Dr. Ron Parker feels she is stable to proceed with surgery without further cardiac workup. He will see her back in followup in 2 months. Please call if she has any problems.

## 2012-05-21 NOTE — Assessment & Plan Note (Signed)
No recent chest pain  

## 2012-05-21 NOTE — Patient Instructions (Addendum)
Continue taking your medications as prescribed  Your physician recommends that you schedule a follow-up appointment in:  2-3 months with Dr. Ron Parker

## 2012-05-21 NOTE — Assessment & Plan Note (Signed)
Patient's palpitations have been stable since she was started on diltiazem.

## 2012-05-21 NOTE — Assessment & Plan Note (Signed)
Patient has history of normal ejection fraction of 55-60% on echo in 2011.

## 2012-05-22 DIAGNOSIS — W19XXXA Unspecified fall, initial encounter: Secondary | ICD-10-CM | POA: Diagnosis not present

## 2012-05-22 DIAGNOSIS — Y998 Other external cause status: Secondary | ICD-10-CM | POA: Diagnosis not present

## 2012-05-22 DIAGNOSIS — Y929 Unspecified place or not applicable: Secondary | ICD-10-CM | POA: Diagnosis not present

## 2012-05-22 DIAGNOSIS — S52279A Monteggia's fracture of unspecified ulna, initial encounter for closed fracture: Secondary | ICD-10-CM | POA: Diagnosis not present

## 2012-05-22 DIAGNOSIS — Y9389 Activity, other specified: Secondary | ICD-10-CM | POA: Diagnosis not present

## 2012-06-02 DIAGNOSIS — M25539 Pain in unspecified wrist: Secondary | ICD-10-CM | POA: Diagnosis not present

## 2012-06-02 DIAGNOSIS — S52279A Monteggia's fracture of unspecified ulna, initial encounter for closed fracture: Secondary | ICD-10-CM | POA: Diagnosis not present

## 2012-06-05 DIAGNOSIS — M25539 Pain in unspecified wrist: Secondary | ICD-10-CM | POA: Diagnosis not present

## 2012-06-05 DIAGNOSIS — S52279A Monteggia's fracture of unspecified ulna, initial encounter for closed fracture: Secondary | ICD-10-CM | POA: Diagnosis not present

## 2012-06-11 DIAGNOSIS — S52279A Monteggia's fracture of unspecified ulna, initial encounter for closed fracture: Secondary | ICD-10-CM | POA: Diagnosis not present

## 2012-06-13 DIAGNOSIS — M25539 Pain in unspecified wrist: Secondary | ICD-10-CM | POA: Diagnosis not present

## 2012-06-13 DIAGNOSIS — S52279A Monteggia's fracture of unspecified ulna, initial encounter for closed fracture: Secondary | ICD-10-CM | POA: Diagnosis not present

## 2012-06-16 ENCOUNTER — Other Ambulatory Visit: Payer: Self-pay | Admitting: *Deleted

## 2012-06-16 MED ORDER — DILTIAZEM HCL ER COATED BEADS 120 MG PO CP24: 120.0000 mg | ORAL_CAPSULE | Freq: Every day | ORAL | Status: DC

## 2012-06-18 DIAGNOSIS — M25539 Pain in unspecified wrist: Secondary | ICD-10-CM | POA: Diagnosis not present

## 2012-06-18 DIAGNOSIS — S52279A Monteggia's fracture of unspecified ulna, initial encounter for closed fracture: Secondary | ICD-10-CM | POA: Diagnosis not present

## 2012-06-23 DIAGNOSIS — S52279A Monteggia's fracture of unspecified ulna, initial encounter for closed fracture: Secondary | ICD-10-CM | POA: Diagnosis not present

## 2012-06-23 DIAGNOSIS — M25539 Pain in unspecified wrist: Secondary | ICD-10-CM | POA: Diagnosis not present

## 2012-06-23 DIAGNOSIS — M810 Age-related osteoporosis without current pathological fracture: Secondary | ICD-10-CM | POA: Diagnosis not present

## 2012-06-23 DIAGNOSIS — E1129 Type 2 diabetes mellitus with other diabetic kidney complication: Secondary | ICD-10-CM | POA: Diagnosis not present

## 2012-06-23 DIAGNOSIS — E785 Hyperlipidemia, unspecified: Secondary | ICD-10-CM | POA: Diagnosis not present

## 2012-06-23 DIAGNOSIS — I10 Essential (primary) hypertension: Secondary | ICD-10-CM | POA: Diagnosis not present

## 2012-06-23 DIAGNOSIS — L4050 Arthropathic psoriasis, unspecified: Secondary | ICD-10-CM | POA: Diagnosis not present

## 2012-06-23 DIAGNOSIS — N058 Unspecified nephritic syndrome with other morphologic changes: Secondary | ICD-10-CM | POA: Diagnosis not present

## 2012-06-23 DIAGNOSIS — M069 Rheumatoid arthritis, unspecified: Secondary | ICD-10-CM | POA: Diagnosis not present

## 2012-06-30 DIAGNOSIS — M25539 Pain in unspecified wrist: Secondary | ICD-10-CM | POA: Diagnosis not present

## 2012-07-04 DIAGNOSIS — M25539 Pain in unspecified wrist: Secondary | ICD-10-CM | POA: Diagnosis not present

## 2012-07-04 DIAGNOSIS — S52279A Monteggia's fracture of unspecified ulna, initial encounter for closed fracture: Secondary | ICD-10-CM | POA: Diagnosis not present

## 2012-07-29 DIAGNOSIS — M25539 Pain in unspecified wrist: Secondary | ICD-10-CM | POA: Diagnosis not present

## 2012-07-29 DIAGNOSIS — S52279A Monteggia's fracture of unspecified ulna, initial encounter for closed fracture: Secondary | ICD-10-CM | POA: Diagnosis not present

## 2012-07-30 DIAGNOSIS — M810 Age-related osteoporosis without current pathological fracture: Secondary | ICD-10-CM | POA: Diagnosis not present

## 2012-07-30 DIAGNOSIS — M069 Rheumatoid arthritis, unspecified: Secondary | ICD-10-CM | POA: Diagnosis not present

## 2012-08-01 ENCOUNTER — Ambulatory Visit (INDEPENDENT_AMBULATORY_CARE_PROVIDER_SITE_OTHER): Payer: Medicare Other | Admitting: Cardiology

## 2012-08-01 ENCOUNTER — Encounter: Payer: Self-pay | Admitting: Cardiology

## 2012-08-01 VITALS — BP 122/54 | HR 69 | Ht 61.5 in | Wt 142.4 lb

## 2012-08-01 DIAGNOSIS — R002 Palpitations: Secondary | ICD-10-CM | POA: Diagnosis not present

## 2012-08-01 DIAGNOSIS — R079 Chest pain, unspecified: Secondary | ICD-10-CM | POA: Diagnosis not present

## 2012-08-01 NOTE — Assessment & Plan Note (Signed)
She's not had any recurrent chest pain. The patient is on a cholesterol medication that she takes one day per week. This is being overseen by Dr. Forde Dandy.

## 2012-08-01 NOTE — Assessment & Plan Note (Signed)
She's not had any recurrent palpitations. Her diltiazem will be continued.

## 2012-08-01 NOTE — Patient Instructions (Addendum)
Your physician recommends that you continue on your current medications as directed. Please refer to the Current Medication list given to you today.  Your physician wants you to follow-up in: 1 year. You will receive a reminder letter in the mail two months in advance. If you don't receive a letter, please call our office to schedule the follow-up appointment.  

## 2012-08-01 NOTE — Progress Notes (Signed)
HPI  Patient is seen to followup some mild chest pain in the past and some mild palpitations. She is no proven coronary disease. Cardiac catheterization has never been done. She had a nuclear scan in 2008 showing no scar or ischemia. Patient had one brief episode of supraventricular tachycardia. This may have been a reentrant tachycardia. She's on a small dose of diltiazem and she does very well with this.  She has not had any recent chest pain or significant shortness of breath.  Allergies  Allergen Reactions  . Compazine (Prochlorperazine Edisylate)   . Sulfonamide Derivatives     Current Outpatient Prescriptions  Medication Sig Dispense Refill  . aspirin 81 MG tablet Take 81 mg by mouth daily.        . Calcium Carbonate-Vitamin D (CALTRATE 600+D) 600-400 MG-UNIT per tablet Take 1 tablet by mouth daily.        . cholecalciferol (VITAMIN D) 400 UNITS TABS Take 400 Units by mouth daily.        Marland Kitchen diltiazem (CARDIZEM CD) 120 MG 24 hr capsule Take 1 capsule (120 mg total) by mouth daily.  30 capsule  5  . etanercept (ENBREL) 50 MG/ML injection Inject 50 mg into the skin once a week.       Marland Kitchen glipiZIDE (GLUCOTROL) 10 MG tablet Take 10 mg by mouth 2 (two) times daily before a meal.        . ibandronate (BONIVA) 150 MG tablet Take 150 mg by mouth every 30 (thirty) days. Take in the morning with a full glass of water, on an empty stomach, and do not take anything else by mouth or lie down for the next 30 min.       Marland Kitchen KOMBIGLYZE XR 2.07-998 MG TB24 Take 0.5 tablets by mouth at bedtime.      . lansoprazole (PREVACID) 15 MG capsule Take 15 mg by mouth daily.        Marland Kitchen LIVALO 2 MG TABS Take 1 tablet by mouth Once a week.      . metFORMIN (GLUCOPHAGE) 1000 MG tablet Take 500 mg by mouth daily with breakfast.       . metoprolol succinate (TOPROL-XL) 25 MG 24 hr tablet Take 25 mg by mouth daily.        Marland Kitchen NITROSTAT 0.4 MG SL tablet 1 TABLET UNDER TONGUE AT ONSET OF CHEST PAIN YOU MAY REPEAT EVERY 5  MINUTES FOR UP TO 3 DOSES.  25 tablet  5  . predniSONE (DELTASONE) 10 MG tablet Take 5 mg by mouth daily.       . Prenatal Multivit-Min-Fe-FA (PRE-NATAL FORMULA) TABS Take 1 tablet by mouth.         No current facility-administered medications for this visit.    History   Social History  . Marital Status: Married    Spouse Name: N/A    Number of Children: N/A  . Years of Education: N/A   Occupational History  . Not on file.   Social History Main Topics  . Smoking status: Never Smoker   . Smokeless tobacco: Not on file  . Alcohol Use: Not on file  . Drug Use: Not on file  . Sexually Active: Yes   Other Topics Concern  . Not on file   Social History Narrative  . No narrative on file    History reviewed. No pertinent family history.  Past Medical History  Diagnosis Date  . Mitral regurgitation April 21, 2009    mild,  echo,  February, 2011  . Chest pain, unspecified     Nuclear, May, 2008, no scar or ischemia  . Palpitations     possible very brief atrial fibrillation on monitor and possible reentrant tachycardia  . Diabetes mellitus   . Bruit     Carotid Doppler showed no significant abnormality     9per patient)  . Arthritis, rheumatoid   . GERD (gastroesophageal reflux disease)   . Osteoporosis   . Psoriasis   . Dyslipidemia   . Ejection fraction     EF 55-60%, echo, February, 2011    History reviewed. No pertinent past surgical history.  Patient Active Problem List   Diagnosis Date Noted  . Preoperative clearance 05/21/2012  . Chest pain, unspecified   . Palpitations   . Diabetes mellitus   . GERD (gastroesophageal reflux disease)   . Ejection fraction   . Bruit   . Mitral regurgitation 04/21/2009  . PSORIASIS 02/09/2009  . RHEUMATOID ARTHRITIS 02/09/2009    ROS   Patient denies fever, chills, headache, sweats, rash, change in vision, change in hearing, chest pain, cough, nausea vomiting, urinary symptoms. All other systems are reviewed and  are negative.  PHYSICAL EXAM  Patient is oriented to person time and place. Affect is normal. There is no jugulovenous distention. Lungs reveal a few scattered rhonchi. Cardiac exam reveals S1 and S2. There no clicks or significant murmurs. The abdomen is soft. There is no peripheral edema. She has deforming rheumatoid arthritis.  Filed Vitals:   08/01/12 1429  BP: 122/54  Pulse: 69  Height: 5' 1.5" (1.562 m)  Weight: 142 lb 6.4 oz (64.592 kg)  SpO2: 96%     ASSESSMENT & PLAN

## 2012-08-06 DIAGNOSIS — M25539 Pain in unspecified wrist: Secondary | ICD-10-CM | POA: Diagnosis not present

## 2012-10-16 DIAGNOSIS — E559 Vitamin D deficiency, unspecified: Secondary | ICD-10-CM | POA: Diagnosis not present

## 2012-10-16 DIAGNOSIS — M069 Rheumatoid arthritis, unspecified: Secondary | ICD-10-CM | POA: Diagnosis not present

## 2012-10-16 DIAGNOSIS — I10 Essential (primary) hypertension: Secondary | ICD-10-CM | POA: Diagnosis not present

## 2012-10-16 DIAGNOSIS — M810 Age-related osteoporosis without current pathological fracture: Secondary | ICD-10-CM | POA: Diagnosis not present

## 2012-10-16 DIAGNOSIS — N058 Unspecified nephritic syndrome with other morphologic changes: Secondary | ICD-10-CM | POA: Diagnosis not present

## 2012-10-16 DIAGNOSIS — D649 Anemia, unspecified: Secondary | ICD-10-CM | POA: Diagnosis not present

## 2012-10-16 DIAGNOSIS — E1129 Type 2 diabetes mellitus with other diabetic kidney complication: Secondary | ICD-10-CM | POA: Diagnosis not present

## 2012-10-16 DIAGNOSIS — E785 Hyperlipidemia, unspecified: Secondary | ICD-10-CM | POA: Diagnosis not present

## 2012-11-18 DIAGNOSIS — Z961 Presence of intraocular lens: Secondary | ICD-10-CM | POA: Diagnosis not present

## 2012-11-18 DIAGNOSIS — H18419 Arcus senilis, unspecified eye: Secondary | ICD-10-CM | POA: Diagnosis not present

## 2012-11-18 DIAGNOSIS — E119 Type 2 diabetes mellitus without complications: Secondary | ICD-10-CM | POA: Diagnosis not present

## 2012-11-18 DIAGNOSIS — H04129 Dry eye syndrome of unspecified lacrimal gland: Secondary | ICD-10-CM | POA: Diagnosis not present

## 2012-11-28 DIAGNOSIS — Z23 Encounter for immunization: Secondary | ICD-10-CM | POA: Diagnosis not present

## 2012-12-18 ENCOUNTER — Other Ambulatory Visit: Payer: Self-pay | Admitting: Cardiology

## 2013-01-12 DIAGNOSIS — M069 Rheumatoid arthritis, unspecified: Secondary | ICD-10-CM | POA: Diagnosis not present

## 2013-02-06 DIAGNOSIS — M069 Rheumatoid arthritis, unspecified: Secondary | ICD-10-CM | POA: Diagnosis not present

## 2013-02-18 DIAGNOSIS — E785 Hyperlipidemia, unspecified: Secondary | ICD-10-CM | POA: Diagnosis not present

## 2013-02-18 DIAGNOSIS — K219 Gastro-esophageal reflux disease without esophagitis: Secondary | ICD-10-CM | POA: Diagnosis not present

## 2013-02-18 DIAGNOSIS — I10 Essential (primary) hypertension: Secondary | ICD-10-CM | POA: Diagnosis not present

## 2013-02-18 DIAGNOSIS — M069 Rheumatoid arthritis, unspecified: Secondary | ICD-10-CM | POA: Diagnosis not present

## 2013-02-18 DIAGNOSIS — E559 Vitamin D deficiency, unspecified: Secondary | ICD-10-CM | POA: Diagnosis not present

## 2013-02-18 DIAGNOSIS — Z1331 Encounter for screening for depression: Secondary | ICD-10-CM | POA: Diagnosis not present

## 2013-02-18 DIAGNOSIS — D649 Anemia, unspecified: Secondary | ICD-10-CM | POA: Diagnosis not present

## 2013-02-18 DIAGNOSIS — N058 Unspecified nephritic syndrome with other morphologic changes: Secondary | ICD-10-CM | POA: Diagnosis not present

## 2013-02-18 DIAGNOSIS — E1129 Type 2 diabetes mellitus with other diabetic kidney complication: Secondary | ICD-10-CM | POA: Diagnosis not present

## 2013-02-20 DIAGNOSIS — M069 Rheumatoid arthritis, unspecified: Secondary | ICD-10-CM | POA: Diagnosis not present

## 2013-03-27 ENCOUNTER — Ambulatory Visit (HOSPITAL_COMMUNITY)
Admission: RE | Admit: 2013-03-27 | Discharge: 2013-03-27 | Disposition: A | Discharge status: Home / Self Care | Payer: Medicare Other | Source: Ambulatory Visit | Attending: Vascular Surgery | Admitting: Vascular Surgery

## 2013-03-27 ENCOUNTER — Other Ambulatory Visit (HOSPITAL_COMMUNITY): Payer: Self-pay | Admitting: Endocrinology

## 2013-03-27 DIAGNOSIS — I658 Occlusion and stenosis of other precerebral arteries: Secondary | ICD-10-CM | POA: Diagnosis not present

## 2013-03-27 DIAGNOSIS — I6529 Occlusion and stenosis of unspecified carotid artery: Secondary | ICD-10-CM | POA: Insufficient documentation

## 2013-03-27 DIAGNOSIS — M069 Rheumatoid arthritis, unspecified: Secondary | ICD-10-CM | POA: Diagnosis not present

## 2013-03-27 DIAGNOSIS — R0989 Other specified symptoms and signs involving the circulatory and respiratory systems: Secondary | ICD-10-CM

## 2013-04-15 DIAGNOSIS — M069 Rheumatoid arthritis, unspecified: Secondary | ICD-10-CM | POA: Diagnosis not present

## 2013-04-27 DIAGNOSIS — M069 Rheumatoid arthritis, unspecified: Secondary | ICD-10-CM | POA: Diagnosis not present

## 2013-05-27 DIAGNOSIS — M069 Rheumatoid arthritis, unspecified: Secondary | ICD-10-CM | POA: Diagnosis not present

## 2013-05-27 DIAGNOSIS — K117 Disturbances of salivary secretion: Secondary | ICD-10-CM | POA: Diagnosis not present

## 2013-06-23 ENCOUNTER — Other Ambulatory Visit: Payer: Self-pay | Admitting: Cardiology

## 2013-06-24 DIAGNOSIS — M069 Rheumatoid arthritis, unspecified: Secondary | ICD-10-CM | POA: Diagnosis not present

## 2013-07-01 DIAGNOSIS — M810 Age-related osteoporosis without current pathological fracture: Secondary | ICD-10-CM | POA: Diagnosis not present

## 2013-07-01 DIAGNOSIS — E785 Hyperlipidemia, unspecified: Secondary | ICD-10-CM | POA: Diagnosis not present

## 2013-07-01 DIAGNOSIS — N058 Unspecified nephritic syndrome with other morphologic changes: Secondary | ICD-10-CM | POA: Diagnosis not present

## 2013-07-01 DIAGNOSIS — E559 Vitamin D deficiency, unspecified: Secondary | ICD-10-CM | POA: Diagnosis not present

## 2013-07-01 DIAGNOSIS — D649 Anemia, unspecified: Secondary | ICD-10-CM | POA: Diagnosis not present

## 2013-07-01 DIAGNOSIS — E1129 Type 2 diabetes mellitus with other diabetic kidney complication: Secondary | ICD-10-CM | POA: Diagnosis not present

## 2013-07-01 DIAGNOSIS — M069 Rheumatoid arthritis, unspecified: Secondary | ICD-10-CM | POA: Diagnosis not present

## 2013-07-01 DIAGNOSIS — R079 Chest pain, unspecified: Secondary | ICD-10-CM | POA: Diagnosis not present

## 2013-07-24 DIAGNOSIS — M069 Rheumatoid arthritis, unspecified: Secondary | ICD-10-CM | POA: Diagnosis not present

## 2013-08-02 ENCOUNTER — Encounter: Payer: Self-pay | Admitting: Cardiology

## 2013-08-03 ENCOUNTER — Encounter: Payer: Self-pay | Admitting: Cardiology

## 2013-08-03 ENCOUNTER — Ambulatory Visit (INDEPENDENT_AMBULATORY_CARE_PROVIDER_SITE_OTHER): Payer: Medicare Other | Admitting: Cardiology

## 2013-08-03 VITALS — BP 130/64 | HR 72 | Ht 61.0 in | Wt 142.0 lb

## 2013-08-03 DIAGNOSIS — M069 Rheumatoid arthritis, unspecified: Secondary | ICD-10-CM

## 2013-08-03 DIAGNOSIS — R002 Palpitations: Secondary | ICD-10-CM | POA: Diagnosis not present

## 2013-08-03 DIAGNOSIS — R079 Chest pain, unspecified: Secondary | ICD-10-CM

## 2013-08-03 NOTE — Patient Instructions (Signed)
Your physician recommends that you continue on your current medications as directed. Please refer to the Current Medication list given to you today.  Your physician wants you to follow-up in: 1 year. You will receive a reminder letter in the mail two months in advance. If you don't receive a letter, please call our office to schedule the follow-up appointment.  

## 2013-08-03 NOTE — Assessment & Plan Note (Signed)
Her rheumatoid arthritis is stable.

## 2013-08-03 NOTE — Assessment & Plan Note (Signed)
She's not having any recurrent significant palpitations. No change in therapy.

## 2013-08-03 NOTE — Assessment & Plan Note (Signed)
She's not having any significant chest pain. No further workup.

## 2013-08-03 NOTE — Progress Notes (Signed)
Patient ID: Shelby Walker, female   DOB: [birth year], 80 y.o.   MRN: [id number]    HPI  Patient is seen today to followup some mild chest pain and palpitations that she's had in the past. She's actually doing well. Her biggest concern is the fact that her husband is not feeling well. Historically she spends a lot of time and effort helping with him.  Allergies  Allergen Reactions  . Compazine [Prochlorperazine Edisylate]   . Sulfonamide Derivatives     Current Outpatient Prescriptions  Medication Sig Dispense Refill  . Abatacept (ORENCIA IV) Inject into the vein. One infushion monthly      . aspirin 81 MG tablet Take 81 mg by mouth daily.        . Calcium Carbonate-Vitamin D (CALTRATE 600+D) 600-400 MG-UNIT per tablet Take 1 tablet by mouth daily.        . cholecalciferol (VITAMIN D) 400 UNITS TABS Take 400 Units by mouth daily.        Marland Kitchen diltiazem (CARDIZEM CD) 120 MG 24 hr capsule TAKE 1 CAPSULE DAILY.  30 capsule  1  . esomeprazole (NEXIUM) 40 MG capsule Take 40 mg by mouth daily at 12 noon.      Marland Kitchen glipiZIDE (GLUCOTROL) 10 MG tablet Take 10 mg by mouth 2 (two) times daily before a meal.        . ibandronate (BONIVA) 150 MG tablet Take 150 mg by mouth every 30 (thirty) days. Take in the morning with a full glass of water, on an empty stomach, and do not take anything else by mouth or lie down for the next 30 min.       Marland Kitchen KOMBIGLYZE XR 2.07-998 MG TB24 Take 0.5 tablets by mouth at bedtime.      Marland Kitchen LIVALO 2 MG TABS Take 1 tablet by mouth Once a week.      . metFORMIN (GLUCOPHAGE) 1000 MG tablet Take 500 mg by mouth daily with breakfast.       . metoprolol succinate (TOPROL-XL) 25 MG 24 hr tablet Take 25 mg by mouth daily.        . Multiple Vitamins-Minerals (CENTRUM SILVER ULTRA WOMENS) TABS Take by mouth. 1 tab daily      . NITROSTAT 0.4 MG SL tablet 1 TABLET UNDER TONGUE AT ONSET OF CHEST PAIN YOU MAY REPEAT EVERY 5 MINUTES FOR UP TO 3 DOSES.  25 tablet  5  . predniSONE (DELTASONE) 10 MG  tablet Take 5 mg by mouth daily.        No current facility-administered medications for this visit.    History   Social History  . Marital Status: Married    Spouse Name: N/A    Number of Children: N/A  . Years of Education: N/A   Occupational History  . Not on file.   Social History Main Topics  . Smoking status: Never Smoker   . Smokeless tobacco: Not on file  . Alcohol Use: Not on file  . Drug Use: Not on file  . Sexual Activity: Yes   Other Topics Concern  . Not on file   Social History Narrative  . No narrative on file    No family history on file.  Past Medical History  Diagnosis Date  . Mitral regurgitation April 21, 2009    mild,  echo, February, 2011  . Chest pain, unspecified     Nuclear, May, 2008, no scar or ischemia  . Palpitations     possible very  brief atrial fibrillation on monitor and possible reentrant tachycardia  . Diabetes mellitus   . Bruit     Carotid Doppler showed no significant abnormality     9per patient)  . Arthritis, rheumatoid   . GERD (gastroesophageal reflux disease)   . Osteoporosis   . Psoriasis   . Dyslipidemia   . Ejection fraction     EF 55-60%, echo, February, 2011    History reviewed. No pertinent past surgical history.  Patient Active Problem List   Diagnosis Date Noted  . Preoperative clearance 05/21/2012  . Chest pain, unspecified   . Palpitations   . Diabetes mellitus   . GERD (gastroesophageal reflux disease)   . Ejection fraction   . Bruit   . Mitral regurgitation 04/21/2009  . PSORIASIS 02/09/2009  . RHEUMATOID ARTHRITIS 02/09/2009    ROS   Patient denies fever, chills, headache, sweats, rash, change in vision, change in hearing, chest pain, cough, nausea vomiting, urinary symptoms. All other systems are reviewed and are negative.  PHYSICAL EXAM  Patient is oriented to person time and place. Affect is normal. She is here with her husband. She is very worried about him. Head is atraumatic.  Sclera and conjunctiva are normal. There is no jugulovenous distention. Lungs are clear. Respiratory effort is nonlabored. Cardiac exam reveals S1 and S2. There no clicks or significant murmurs. The abdomen is soft. Is no peripheral edema. There are no skin rashes.  Filed Vitals:   08/03/13 0853  BP: 130/64  Pulse: 72  Height: 5\' 1"  (1.549 m)  Weight: 142 lb (64.411 kg)   EKG is done today and reviewed by me. There is normal sinus rhythm. EKG is normal. There is no change from the past.  ASSESSMENT & PLAN

## 2013-08-17 DIAGNOSIS — M79609 Pain in unspecified limb: Secondary | ICD-10-CM | POA: Diagnosis not present

## 2013-08-17 DIAGNOSIS — M069 Rheumatoid arthritis, unspecified: Secondary | ICD-10-CM | POA: Diagnosis not present

## 2013-08-20 ENCOUNTER — Other Ambulatory Visit: Payer: Self-pay | Admitting: Cardiology

## 2013-08-21 DIAGNOSIS — M069 Rheumatoid arthritis, unspecified: Secondary | ICD-10-CM | POA: Diagnosis not present

## 2013-09-18 DIAGNOSIS — M069 Rheumatoid arthritis, unspecified: Secondary | ICD-10-CM | POA: Diagnosis not present

## 2013-10-16 DIAGNOSIS — M069 Rheumatoid arthritis, unspecified: Secondary | ICD-10-CM | POA: Diagnosis not present

## 2013-10-19 DIAGNOSIS — R609 Edema, unspecified: Secondary | ICD-10-CM | POA: Diagnosis not present

## 2013-10-19 DIAGNOSIS — M069 Rheumatoid arthritis, unspecified: Secondary | ICD-10-CM | POA: Diagnosis not present

## 2013-10-29 DIAGNOSIS — D649 Anemia, unspecified: Secondary | ICD-10-CM | POA: Diagnosis not present

## 2013-10-29 DIAGNOSIS — R0989 Other specified symptoms and signs involving the circulatory and respiratory systems: Secondary | ICD-10-CM | POA: Diagnosis not present

## 2013-10-29 DIAGNOSIS — E1129 Type 2 diabetes mellitus with other diabetic kidney complication: Secondary | ICD-10-CM | POA: Diagnosis not present

## 2013-10-29 DIAGNOSIS — M069 Rheumatoid arthritis, unspecified: Secondary | ICD-10-CM | POA: Diagnosis not present

## 2013-10-29 DIAGNOSIS — N058 Unspecified nephritic syndrome with other morphologic changes: Secondary | ICD-10-CM | POA: Diagnosis not present

## 2013-10-29 DIAGNOSIS — M810 Age-related osteoporosis without current pathological fracture: Secondary | ICD-10-CM | POA: Diagnosis not present

## 2013-10-29 DIAGNOSIS — E785 Hyperlipidemia, unspecified: Secondary | ICD-10-CM | POA: Diagnosis not present

## 2013-10-29 DIAGNOSIS — E559 Vitamin D deficiency, unspecified: Secondary | ICD-10-CM | POA: Diagnosis not present

## 2013-11-13 DIAGNOSIS — M069 Rheumatoid arthritis, unspecified: Secondary | ICD-10-CM | POA: Diagnosis not present

## 2013-11-24 DIAGNOSIS — H353190 Nonexudative age-related macular degeneration, unspecified eye, stage unspecified: Secondary | ICD-10-CM | POA: Diagnosis not present

## 2013-11-24 DIAGNOSIS — H43819 Vitreous degeneration, unspecified eye: Secondary | ICD-10-CM | POA: Diagnosis not present

## 2013-11-24 DIAGNOSIS — Z961 Presence of intraocular lens: Secondary | ICD-10-CM | POA: Diagnosis not present

## 2013-11-24 DIAGNOSIS — E119 Type 2 diabetes mellitus without complications: Secondary | ICD-10-CM | POA: Diagnosis not present

## 2013-12-11 DIAGNOSIS — M0579 Rheumatoid arthritis with rheumatoid factor of multiple sites without organ or systems involvement: Secondary | ICD-10-CM | POA: Diagnosis not present

## 2014-01-01 DIAGNOSIS — H04123 Dry eye syndrome of bilateral lacrimal glands: Secondary | ICD-10-CM | POA: Diagnosis not present

## 2014-01-01 DIAGNOSIS — S0502XA Injury of conjunctiva and corneal abrasion without foreign body, left eye, initial encounter: Secondary | ICD-10-CM | POA: Diagnosis not present

## 2014-01-08 DIAGNOSIS — M0589 Other rheumatoid arthritis with rheumatoid factor of multiple sites: Secondary | ICD-10-CM | POA: Diagnosis not present

## 2014-01-11 DIAGNOSIS — S0502XA Injury of conjunctiva and corneal abrasion without foreign body, left eye, initial encounter: Secondary | ICD-10-CM | POA: Diagnosis not present

## 2014-01-11 DIAGNOSIS — H04123 Dry eye syndrome of bilateral lacrimal glands: Secondary | ICD-10-CM | POA: Diagnosis not present

## 2014-01-20 DIAGNOSIS — R600 Localized edema: Secondary | ICD-10-CM | POA: Diagnosis not present

## 2014-01-20 DIAGNOSIS — M791 Myalgia: Secondary | ICD-10-CM | POA: Diagnosis not present

## 2014-01-20 DIAGNOSIS — M069 Rheumatoid arthritis, unspecified: Secondary | ICD-10-CM | POA: Diagnosis not present

## 2014-02-05 DIAGNOSIS — M0540 Rheumatoid myopathy with rheumatoid arthritis of unspecified site: Secondary | ICD-10-CM | POA: Diagnosis not present

## 2014-02-05 DIAGNOSIS — M0589 Other rheumatoid arthritis with rheumatoid factor of multiple sites: Secondary | ICD-10-CM | POA: Diagnosis not present

## 2014-02-22 ENCOUNTER — Other Ambulatory Visit: Payer: Self-pay | Admitting: Cardiology

## 2014-03-06 ENCOUNTER — Other Ambulatory Visit: Payer: Self-pay | Admitting: Cardiology

## 2014-03-10 DIAGNOSIS — R0989 Other specified symptoms and signs involving the circulatory and respiratory systems: Secondary | ICD-10-CM | POA: Diagnosis not present

## 2014-03-10 DIAGNOSIS — E785 Hyperlipidemia, unspecified: Secondary | ICD-10-CM | POA: Diagnosis not present

## 2014-03-10 DIAGNOSIS — I10 Essential (primary) hypertension: Secondary | ICD-10-CM | POA: Diagnosis not present

## 2014-03-10 DIAGNOSIS — M810 Age-related osteoporosis without current pathological fracture: Secondary | ICD-10-CM | POA: Diagnosis not present

## 2014-03-10 DIAGNOSIS — N08 Glomerular disorders in diseases classified elsewhere: Secondary | ICD-10-CM | POA: Diagnosis not present

## 2014-03-10 DIAGNOSIS — E559 Vitamin D deficiency, unspecified: Secondary | ICD-10-CM | POA: Diagnosis not present

## 2014-03-10 DIAGNOSIS — D649 Anemia, unspecified: Secondary | ICD-10-CM | POA: Diagnosis not present

## 2014-03-10 DIAGNOSIS — E1129 Type 2 diabetes mellitus with other diabetic kidney complication: Secondary | ICD-10-CM | POA: Diagnosis not present

## 2014-03-10 DIAGNOSIS — M069 Rheumatoid arthritis, unspecified: Secondary | ICD-10-CM | POA: Diagnosis not present

## 2014-03-10 DIAGNOSIS — Z6825 Body mass index (BMI) 25.0-25.9, adult: Secondary | ICD-10-CM | POA: Diagnosis not present

## 2014-03-10 DIAGNOSIS — M0589 Other rheumatoid arthritis with rheumatoid factor of multiple sites: Secondary | ICD-10-CM | POA: Diagnosis not present

## 2014-03-10 DIAGNOSIS — R079 Chest pain, unspecified: Secondary | ICD-10-CM | POA: Diagnosis not present

## 2014-04-07 DIAGNOSIS — M0589 Other rheumatoid arthritis with rheumatoid factor of multiple sites: Secondary | ICD-10-CM | POA: Diagnosis not present

## 2014-05-06 DIAGNOSIS — M0589 Other rheumatoid arthritis with rheumatoid factor of multiple sites: Secondary | ICD-10-CM | POA: Diagnosis not present

## 2014-05-12 DIAGNOSIS — M810 Age-related osteoporosis without current pathological fracture: Secondary | ICD-10-CM | POA: Diagnosis not present

## 2014-06-03 DIAGNOSIS — M0589 Other rheumatoid arthritis with rheumatoid factor of multiple sites: Secondary | ICD-10-CM | POA: Diagnosis not present

## 2014-06-03 DIAGNOSIS — M069 Rheumatoid arthritis, unspecified: Secondary | ICD-10-CM | POA: Diagnosis not present

## 2014-06-09 DIAGNOSIS — M810 Age-related osteoporosis without current pathological fracture: Secondary | ICD-10-CM | POA: Diagnosis not present

## 2014-06-09 DIAGNOSIS — M069 Rheumatoid arthritis, unspecified: Secondary | ICD-10-CM | POA: Diagnosis not present

## 2014-07-05 DIAGNOSIS — M069 Rheumatoid arthritis, unspecified: Secondary | ICD-10-CM | POA: Diagnosis not present

## 2014-07-09 DIAGNOSIS — D649 Anemia, unspecified: Secondary | ICD-10-CM | POA: Diagnosis not present

## 2014-07-09 DIAGNOSIS — K760 Fatty (change of) liver, not elsewhere classified: Secondary | ICD-10-CM | POA: Diagnosis not present

## 2014-07-09 DIAGNOSIS — E559 Vitamin D deficiency, unspecified: Secondary | ICD-10-CM | POA: Diagnosis not present

## 2014-07-09 DIAGNOSIS — I10 Essential (primary) hypertension: Secondary | ICD-10-CM | POA: Diagnosis not present

## 2014-07-09 DIAGNOSIS — M069 Rheumatoid arthritis, unspecified: Secondary | ICD-10-CM | POA: Diagnosis not present

## 2014-07-09 DIAGNOSIS — Z6826 Body mass index (BMI) 26.0-26.9, adult: Secondary | ICD-10-CM | POA: Diagnosis not present

## 2014-07-09 DIAGNOSIS — E785 Hyperlipidemia, unspecified: Secondary | ICD-10-CM | POA: Diagnosis not present

## 2014-07-09 DIAGNOSIS — E1129 Type 2 diabetes mellitus with other diabetic kidney complication: Secondary | ICD-10-CM | POA: Diagnosis not present

## 2014-07-09 DIAGNOSIS — N08 Glomerular disorders in diseases classified elsewhere: Secondary | ICD-10-CM | POA: Diagnosis not present

## 2014-07-09 DIAGNOSIS — M810 Age-related osteoporosis without current pathological fracture: Secondary | ICD-10-CM | POA: Diagnosis not present

## 2014-07-13 ENCOUNTER — Encounter: Payer: Self-pay | Admitting: Cardiology

## 2014-08-04 DIAGNOSIS — M069 Rheumatoid arthritis, unspecified: Secondary | ICD-10-CM | POA: Diagnosis not present

## 2014-08-16 ENCOUNTER — Ambulatory Visit: Payer: Medicare Other | Admitting: Cardiology

## 2014-08-19 DIAGNOSIS — M4316 Spondylolisthesis, lumbar region: Secondary | ICD-10-CM | POA: Diagnosis not present

## 2014-08-19 DIAGNOSIS — M47817 Spondylosis without myelopathy or radiculopathy, lumbosacral region: Secondary | ICD-10-CM | POA: Diagnosis not present

## 2014-08-19 DIAGNOSIS — R1110 Vomiting, unspecified: Secondary | ICD-10-CM | POA: Diagnosis not present

## 2014-08-19 DIAGNOSIS — Z6826 Body mass index (BMI) 26.0-26.9, adult: Secondary | ICD-10-CM | POA: Diagnosis not present

## 2014-08-19 DIAGNOSIS — M47816 Spondylosis without myelopathy or radiculopathy, lumbar region: Secondary | ICD-10-CM | POA: Diagnosis not present

## 2014-08-19 DIAGNOSIS — M5416 Radiculopathy, lumbar region: Secondary | ICD-10-CM | POA: Diagnosis not present

## 2014-08-19 DIAGNOSIS — B029 Zoster without complications: Secondary | ICD-10-CM | POA: Diagnosis not present

## 2014-08-23 DIAGNOSIS — L718 Other rosacea: Secondary | ICD-10-CM | POA: Diagnosis not present

## 2014-08-23 DIAGNOSIS — B029 Zoster without complications: Secondary | ICD-10-CM | POA: Diagnosis not present

## 2014-08-30 ENCOUNTER — Other Ambulatory Visit: Payer: Self-pay | Admitting: Cardiology

## 2014-09-08 ENCOUNTER — Ambulatory Visit (INDEPENDENT_AMBULATORY_CARE_PROVIDER_SITE_OTHER): Payer: Medicare Other | Admitting: Cardiology

## 2014-09-08 ENCOUNTER — Encounter: Payer: Self-pay | Admitting: Cardiology

## 2014-09-08 VITALS — BP 92/60 | HR 79 | Ht 61.0 in | Wt 137.8 lb

## 2014-09-08 DIAGNOSIS — R002 Palpitations: Secondary | ICD-10-CM

## 2014-09-08 DIAGNOSIS — R072 Precordial pain: Secondary | ICD-10-CM

## 2014-09-08 DIAGNOSIS — I779 Disorder of arteries and arterioles, unspecified: Secondary | ICD-10-CM

## 2014-09-08 DIAGNOSIS — I739 Peripheral vascular disease, unspecified: Secondary | ICD-10-CM

## 2014-09-08 NOTE — Assessment & Plan Note (Signed)
The patient had a brief run of supraventricular tachycardia in the past. She has not had any significant recurrence. She is on a small dose of Cardizem and a small dose of a beta blocker. She's been quite stable with this. In the office today her blood pressure is lower than usual. In office visits with other doctors over the past several months her blood pressure has been fine. I've chosen not to change her medications at this time. No further workup.

## 2014-09-08 NOTE — Assessment & Plan Note (Signed)
Patient had a nuclear study in 2008. There was no scar or ischemia. She's not having any significant recurrent symptoms. No further workup.

## 2014-09-08 NOTE — Assessment & Plan Note (Signed)
The patient has mild carotid disease by Doppler January, 2015. She does not need a follow-up at this time.

## 2014-09-08 NOTE — Patient Instructions (Signed)
**Note De-Identified  Obfuscation** Medication Instructions:  Same-no change  Labwork: None  Testing/Procedures: None  Follow-Up: Your physician wants you to follow-up in: 1 year with Dr Acie Fredrickson. You will receive a reminder letter in the mail two months in advance. If you don't receive a letter, please call our office to schedule the follow-up appointment.

## 2014-09-08 NOTE — Progress Notes (Signed)
Cardiology Office Note   Date:  09/08/2014   ID:  Shelby Walker, DOB 19-Jul-1933, MRN [id number]  PCP:  Shelby Stack, MD  Cardiologist:  Shelby Argyle, MD   Chief Complaint  Patient presents with  . Appointment   follow-up palpitations and some chest discomfort in the past    History of Present Illness: Shelby Walker is a 81 y.o. female who presents today to follow-up a history of mild chest discomfort and palpitations in the past. She is feeling well and not having any significant symptoms.  Recently she had a mild case of shingles. She also had an upper respiratory infection that is being treated and improving.  The patient is aware that I will be retiring at the end of September, 2016. She has requested that both she and her husband, Shelby Walker, be followed by Dr.Nahser. We will arrange for a one-year follow-up visit for her and a 6 month follow-up visit for her husband Shelby Walker . Past Medical History  Diagnosis Date  . Mitral regurgitation April 21, 2009    mild,  echo, February, 2011  . Chest pain, unspecified     Nuclear, May, 2008, no scar or ischemia  . Palpitations     possible very brief atrial fibrillation on monitor and possible reentrant tachycardia  . Diabetes mellitus   . Bruit     Carotid Doppler showed no significant abnormality     9per patient)  . Arthritis, rheumatoid   . GERD (gastroesophageal reflux disease)   . Osteoporosis   . Psoriasis   . Dyslipidemia   . Ejection fraction     EF 55-60%, echo, February, 2011    No past surgical history on file.  Patient Active Problem List   Diagnosis Date Noted  . Preoperative clearance 05/21/2012  . Chest pain, unspecified   . Palpitations   . Diabetes mellitus   . GERD (gastroesophageal reflux disease)   . Ejection fraction   . Bruit   . Mitral regurgitation 04/21/2009  . PSORIASIS 02/09/2009  . RHEUMATOID ARTHRITIS 02/09/2009      Current Outpatient Prescriptions  Medication Sig Dispense Refill    . Abatacept (ORENCIA IV) Inject into the vein. One infushion monthly    . aspirin 81 MG tablet Take 81 mg by mouth daily.      . Calcium Carbonate-Vitamin D (CALTRATE 600+D) 600-400 MG-UNIT per tablet Take 1 tablet by mouth daily.      . cholecalciferol (VITAMIN D) 400 UNITS TABS Take 400 Units by mouth daily.      Marland Kitchen diltiazem (CARDIZEM CD) 120 MG 24 hr capsule TAKE 1 CAPSULE DAILY. 30 capsule 5  . esomeprazole (NEXIUM) 40 MG capsule Take 40 mg by mouth daily at 12 noon.    Marland Kitchen glipiZIDE (GLUCOTROL) 10 MG tablet Take 10 mg by mouth 2 (two) times daily before a meal.      . ibandronate (BONIVA) 150 MG tablet Take 150 mg by mouth every 30 (thirty) days. Take in the morning with a full glass of water, on an empty stomach, and do not take anything else by mouth or lie down for the next 30 min.     Marland Kitchen KOMBIGLYZE XR 2.07-998 MG TB24 Take 0.5 tablets by mouth at bedtime.    . Levofloxacin (LEVAQUIN PO) Take 1 tablet by mouth daily. Start 09-06-13 and continue for one (1) week    . metFORMIN (GLUCOPHAGE) 1000 MG tablet Take 500 mg by mouth daily with breakfast.     .  metoprolol succinate (TOPROL-XL) 25 MG 24 hr tablet Take 25 mg by mouth daily.      . Multiple Vitamins-Minerals (CENTRUM SILVER ULTRA WOMENS) TABS Take by mouth. 1 tab daily    . NITROSTAT 0.4 MG SL tablet 1 TABLET UNDER TONGUE AT ONSET OF CHEST PAIN YOU MAY REPEAT EVERY 5 MINUTES FOR UP TO 3 DOSES. 25 tablet 5  . predniSONE (DELTASONE) 10 MG tablet Take 5 mg by mouth daily.      No current facility-administered medications for this visit.    Allergies:   Compazine and Sulfonamide derivatives    Social History:  The patient  reports that she has never smoked. She has never used smokeless tobacco.   Family History:  The patient's family history includes Diabetes in her father and mother; Heart attack in her father and mother; Heart failure in her father and mother.    ROS:  Please see the history of present illness.     Patient denies  fever, chills, headache, sweats, rash, change in vision, change in hearing, chest pain, cough, nausea or vomiting, urinary symptoms. All other systems are reviewed and are negative.   PHYSICAL EXAM: VS:  BP 92/60 mmHg  Pulse 79  Ht 5\' 1"  (1.549 m)  Wt 137 lb 12.8 oz (62.506 kg)  BMI 26.05 kg/m2 , Patient is oriented to person time and place. Affect is normal. Head is atraumatic. Sclera and conjunctiva are normal. There is no jugular venous distention. Lungs are clear. Respiratory effort is nonlabored. Cardiac exam reveals S1 and S2. Abdomen is soft. There is no peripheral edema. The patient has deforming which would arthritis it is very obvious in her hands. Neurologic is grossly intact.  EKG:   EKG is normal.   Recent Labs: No results found for requested labs within last 365 days.    Lipid Panel No results found for: CHOL, TRIG, HDL, CHOLHDL, VLDL, LDLCALC, LDLDIRECT    Wt Readings from Last 3 Encounters:  09/08/14 137 lb 12.8 oz (62.506 kg)  08/03/13 142 lb (64.411 kg)  08/01/12 142 lb 6.4 oz (64.592 kg)      Current medicines are reviewed  The patient understands her medications well.     ASSESSMENT AND PLAN:

## 2014-09-29 DIAGNOSIS — M069 Rheumatoid arthritis, unspecified: Secondary | ICD-10-CM | POA: Diagnosis not present

## 2014-09-29 DIAGNOSIS — R5383 Other fatigue: Secondary | ICD-10-CM | POA: Diagnosis not present

## 2014-09-30 DIAGNOSIS — L4050 Arthropathic psoriasis, unspecified: Secondary | ICD-10-CM | POA: Diagnosis not present

## 2014-09-30 DIAGNOSIS — Z6825 Body mass index (BMI) 25.0-25.9, adult: Secondary | ICD-10-CM | POA: Diagnosis not present

## 2014-09-30 DIAGNOSIS — M810 Age-related osteoporosis without current pathological fracture: Secondary | ICD-10-CM | POA: Diagnosis not present

## 2014-10-27 DIAGNOSIS — M069 Rheumatoid arthritis, unspecified: Secondary | ICD-10-CM | POA: Diagnosis not present

## 2014-11-04 DIAGNOSIS — M069 Rheumatoid arthritis, unspecified: Secondary | ICD-10-CM | POA: Diagnosis not present

## 2014-11-24 DIAGNOSIS — M069 Rheumatoid arthritis, unspecified: Secondary | ICD-10-CM | POA: Diagnosis not present

## 2014-11-26 DIAGNOSIS — Z961 Presence of intraocular lens: Secondary | ICD-10-CM | POA: Diagnosis not present

## 2014-11-26 DIAGNOSIS — H02839 Dermatochalasis of unspecified eye, unspecified eyelid: Secondary | ICD-10-CM | POA: Diagnosis not present

## 2014-11-26 DIAGNOSIS — Z79899 Other long term (current) drug therapy: Secondary | ICD-10-CM | POA: Diagnosis not present

## 2014-11-26 DIAGNOSIS — M21949 Unspecified acquired deformity of hand, unspecified hand: Secondary | ICD-10-CM | POA: Diagnosis not present

## 2014-11-26 DIAGNOSIS — M810 Age-related osteoporosis without current pathological fracture: Secondary | ICD-10-CM | POA: Diagnosis not present

## 2014-11-26 DIAGNOSIS — H0289 Other specified disorders of eyelid: Secondary | ICD-10-CM | POA: Diagnosis not present

## 2014-11-26 DIAGNOSIS — M069 Rheumatoid arthritis, unspecified: Secondary | ICD-10-CM | POA: Diagnosis not present

## 2014-12-01 DIAGNOSIS — M069 Rheumatoid arthritis, unspecified: Secondary | ICD-10-CM | POA: Diagnosis not present

## 2014-12-01 DIAGNOSIS — E785 Hyperlipidemia, unspecified: Secondary | ICD-10-CM | POA: Diagnosis not present

## 2014-12-01 DIAGNOSIS — N08 Glomerular disorders in diseases classified elsewhere: Secondary | ICD-10-CM | POA: Diagnosis not present

## 2014-12-01 DIAGNOSIS — D649 Anemia, unspecified: Secondary | ICD-10-CM | POA: Diagnosis not present

## 2014-12-01 DIAGNOSIS — E119 Type 2 diabetes mellitus without complications: Secondary | ICD-10-CM | POA: Diagnosis not present

## 2014-12-01 DIAGNOSIS — Z6826 Body mass index (BMI) 26.0-26.9, adult: Secondary | ICD-10-CM | POA: Diagnosis not present

## 2014-12-01 DIAGNOSIS — I10 Essential (primary) hypertension: Secondary | ICD-10-CM | POA: Diagnosis not present

## 2014-12-01 DIAGNOSIS — K760 Fatty (change of) liver, not elsewhere classified: Secondary | ICD-10-CM | POA: Diagnosis not present

## 2014-12-01 DIAGNOSIS — E1129 Type 2 diabetes mellitus with other diabetic kidney complication: Secondary | ICD-10-CM | POA: Diagnosis not present

## 2014-12-01 DIAGNOSIS — Z23 Encounter for immunization: Secondary | ICD-10-CM | POA: Diagnosis not present

## 2015-01-21 DIAGNOSIS — H04123 Dry eye syndrome of bilateral lacrimal glands: Secondary | ICD-10-CM | POA: Diagnosis not present

## 2015-01-21 DIAGNOSIS — H10509 Unspecified blepharoconjunctivitis, unspecified eye: Secondary | ICD-10-CM | POA: Diagnosis not present

## 2015-02-15 ENCOUNTER — Other Ambulatory Visit: Payer: Self-pay | Admitting: Cardiology

## 2015-03-28 DIAGNOSIS — Z79899 Other long term (current) drug therapy: Secondary | ICD-10-CM | POA: Diagnosis not present

## 2015-03-28 DIAGNOSIS — M25561 Pain in right knee: Secondary | ICD-10-CM | POA: Diagnosis not present

## 2015-03-28 DIAGNOSIS — M810 Age-related osteoporosis without current pathological fracture: Secondary | ICD-10-CM | POA: Diagnosis not present

## 2015-03-28 DIAGNOSIS — M25569 Pain in unspecified knee: Secondary | ICD-10-CM | POA: Diagnosis not present

## 2015-03-28 DIAGNOSIS — M21949 Unspecified acquired deformity of hand, unspecified hand: Secondary | ICD-10-CM | POA: Diagnosis not present

## 2015-03-28 DIAGNOSIS — M069 Rheumatoid arthritis, unspecified: Secondary | ICD-10-CM | POA: Diagnosis not present

## 2015-03-28 DIAGNOSIS — M25562 Pain in left knee: Secondary | ICD-10-CM | POA: Diagnosis not present

## 2015-04-15 DIAGNOSIS — L210 Seborrhea capitis: Secondary | ICD-10-CM | POA: Diagnosis not present

## 2015-04-15 DIAGNOSIS — L989 Disorder of the skin and subcutaneous tissue, unspecified: Secondary | ICD-10-CM | POA: Diagnosis not present

## 2015-04-15 DIAGNOSIS — L309 Dermatitis, unspecified: Secondary | ICD-10-CM | POA: Diagnosis not present

## 2015-04-15 DIAGNOSIS — Z6825 Body mass index (BMI) 25.0-25.9, adult: Secondary | ICD-10-CM | POA: Diagnosis not present

## 2015-04-27 DIAGNOSIS — D485 Neoplasm of uncertain behavior of skin: Secondary | ICD-10-CM | POA: Diagnosis not present

## 2015-04-27 DIAGNOSIS — L821 Other seborrheic keratosis: Secondary | ICD-10-CM | POA: Diagnosis not present

## 2015-04-27 DIAGNOSIS — L723 Sebaceous cyst: Secondary | ICD-10-CM | POA: Diagnosis not present

## 2015-04-27 DIAGNOSIS — C44529 Squamous cell carcinoma of skin of other part of trunk: Secondary | ICD-10-CM | POA: Diagnosis not present

## 2015-04-27 DIAGNOSIS — D0439 Carcinoma in situ of skin of other parts of face: Secondary | ICD-10-CM | POA: Diagnosis not present

## 2015-04-27 DIAGNOSIS — L820 Inflamed seborrheic keratosis: Secondary | ICD-10-CM | POA: Diagnosis not present

## 2015-04-27 DIAGNOSIS — L570 Actinic keratosis: Secondary | ICD-10-CM | POA: Diagnosis not present

## 2015-04-29 DIAGNOSIS — R002 Palpitations: Secondary | ICD-10-CM | POA: Diagnosis not present

## 2015-04-29 DIAGNOSIS — D6489 Other specified anemias: Secondary | ICD-10-CM | POA: Diagnosis not present

## 2015-04-29 DIAGNOSIS — M810 Age-related osteoporosis without current pathological fracture: Secondary | ICD-10-CM | POA: Diagnosis not present

## 2015-04-29 DIAGNOSIS — E1129 Type 2 diabetes mellitus with other diabetic kidney complication: Secondary | ICD-10-CM | POA: Diagnosis not present

## 2015-04-29 DIAGNOSIS — E784 Other hyperlipidemia: Secondary | ICD-10-CM | POA: Diagnosis not present

## 2015-04-29 DIAGNOSIS — N08 Glomerular disorders in diseases classified elsewhere: Secondary | ICD-10-CM | POA: Diagnosis not present

## 2015-04-29 DIAGNOSIS — M069 Rheumatoid arthritis, unspecified: Secondary | ICD-10-CM | POA: Diagnosis not present

## 2015-04-29 DIAGNOSIS — I10 Essential (primary) hypertension: Secondary | ICD-10-CM | POA: Diagnosis not present

## 2015-04-29 DIAGNOSIS — Z6824 Body mass index (BMI) 24.0-24.9, adult: Secondary | ICD-10-CM | POA: Diagnosis not present

## 2015-04-29 DIAGNOSIS — E559 Vitamin D deficiency, unspecified: Secondary | ICD-10-CM | POA: Diagnosis not present

## 2015-04-29 DIAGNOSIS — R42 Dizziness and giddiness: Secondary | ICD-10-CM | POA: Diagnosis not present

## 2015-05-04 DIAGNOSIS — Z85828 Personal history of other malignant neoplasm of skin: Secondary | ICD-10-CM | POA: Diagnosis not present

## 2015-05-04 DIAGNOSIS — C44529 Squamous cell carcinoma of skin of other part of trunk: Secondary | ICD-10-CM | POA: Diagnosis not present

## 2015-05-04 DIAGNOSIS — D0439 Carcinoma in situ of skin of other parts of face: Secondary | ICD-10-CM | POA: Diagnosis not present

## 2015-06-22 DIAGNOSIS — Z79899 Other long term (current) drug therapy: Secondary | ICD-10-CM | POA: Diagnosis not present

## 2015-06-22 DIAGNOSIS — M542 Cervicalgia: Secondary | ICD-10-CM | POA: Diagnosis not present

## 2015-06-22 DIAGNOSIS — M069 Rheumatoid arthritis, unspecified: Secondary | ICD-10-CM | POA: Diagnosis not present

## 2015-06-22 DIAGNOSIS — M810 Age-related osteoporosis without current pathological fracture: Secondary | ICD-10-CM | POA: Diagnosis not present

## 2015-06-22 DIAGNOSIS — M2550 Pain in unspecified joint: Secondary | ICD-10-CM | POA: Diagnosis not present

## 2015-06-22 DIAGNOSIS — R5383 Other fatigue: Secondary | ICD-10-CM | POA: Diagnosis not present

## 2015-06-22 DIAGNOSIS — M150 Primary generalized (osteo)arthritis: Secondary | ICD-10-CM | POA: Diagnosis not present

## 2015-06-29 DIAGNOSIS — M069 Rheumatoid arthritis, unspecified: Secondary | ICD-10-CM | POA: Diagnosis not present

## 2015-07-13 DIAGNOSIS — M069 Rheumatoid arthritis, unspecified: Secondary | ICD-10-CM | POA: Diagnosis not present

## 2015-07-27 DIAGNOSIS — M069 Rheumatoid arthritis, unspecified: Secondary | ICD-10-CM | POA: Diagnosis not present

## 2015-08-08 DIAGNOSIS — I8312 Varicose veins of left lower extremity with inflammation: Secondary | ICD-10-CM | POA: Diagnosis not present

## 2015-08-08 DIAGNOSIS — Z85828 Personal history of other malignant neoplasm of skin: Secondary | ICD-10-CM | POA: Diagnosis not present

## 2015-08-08 DIAGNOSIS — I872 Venous insufficiency (chronic) (peripheral): Secondary | ICD-10-CM | POA: Diagnosis not present

## 2015-08-08 DIAGNOSIS — I8311 Varicose veins of right lower extremity with inflammation: Secondary | ICD-10-CM | POA: Diagnosis not present

## 2015-08-08 DIAGNOSIS — L400 Psoriasis vulgaris: Secondary | ICD-10-CM | POA: Diagnosis not present

## 2015-08-08 DIAGNOSIS — L570 Actinic keratosis: Secondary | ICD-10-CM | POA: Diagnosis not present

## 2015-08-08 DIAGNOSIS — L245 Irritant contact dermatitis due to other chemical products: Secondary | ICD-10-CM | POA: Diagnosis not present

## 2015-08-24 DIAGNOSIS — D6489 Other specified anemias: Secondary | ICD-10-CM | POA: Diagnosis not present

## 2015-08-24 DIAGNOSIS — Z79899 Other long term (current) drug therapy: Secondary | ICD-10-CM | POA: Diagnosis not present

## 2015-08-24 DIAGNOSIS — L409 Psoriasis, unspecified: Secondary | ICD-10-CM | POA: Diagnosis not present

## 2015-08-24 DIAGNOSIS — R002 Palpitations: Secondary | ICD-10-CM | POA: Diagnosis not present

## 2015-08-24 DIAGNOSIS — R5383 Other fatigue: Secondary | ICD-10-CM | POA: Diagnosis not present

## 2015-08-24 DIAGNOSIS — E1129 Type 2 diabetes mellitus with other diabetic kidney complication: Secondary | ICD-10-CM | POA: Diagnosis not present

## 2015-08-24 DIAGNOSIS — Z1389 Encounter for screening for other disorder: Secondary | ICD-10-CM | POA: Diagnosis not present

## 2015-08-24 DIAGNOSIS — N08 Glomerular disorders in diseases classified elsewhere: Secondary | ICD-10-CM | POA: Diagnosis not present

## 2015-08-24 DIAGNOSIS — I10 Essential (primary) hypertension: Secondary | ICD-10-CM | POA: Diagnosis not present

## 2015-08-24 DIAGNOSIS — R0989 Other specified symptoms and signs involving the circulatory and respiratory systems: Secondary | ICD-10-CM | POA: Diagnosis not present

## 2015-08-24 DIAGNOSIS — M542 Cervicalgia: Secondary | ICD-10-CM | POA: Diagnosis not present

## 2015-08-24 DIAGNOSIS — M810 Age-related osteoporosis without current pathological fracture: Secondary | ICD-10-CM | POA: Diagnosis not present

## 2015-08-24 DIAGNOSIS — Z6825 Body mass index (BMI) 25.0-25.9, adult: Secondary | ICD-10-CM | POA: Diagnosis not present

## 2015-08-24 DIAGNOSIS — M150 Primary generalized (osteo)arthritis: Secondary | ICD-10-CM | POA: Diagnosis not present

## 2015-08-24 DIAGNOSIS — M2550 Pain in unspecified joint: Secondary | ICD-10-CM | POA: Diagnosis not present

## 2015-08-24 DIAGNOSIS — M069 Rheumatoid arthritis, unspecified: Secondary | ICD-10-CM | POA: Diagnosis not present

## 2015-08-24 DIAGNOSIS — E784 Other hyperlipidemia: Secondary | ICD-10-CM | POA: Diagnosis not present

## 2015-09-22 DIAGNOSIS — M542 Cervicalgia: Secondary | ICD-10-CM | POA: Diagnosis not present

## 2015-09-22 DIAGNOSIS — M150 Primary generalized (osteo)arthritis: Secondary | ICD-10-CM | POA: Diagnosis not present

## 2015-09-22 DIAGNOSIS — R5383 Other fatigue: Secondary | ICD-10-CM | POA: Diagnosis not present

## 2015-09-22 DIAGNOSIS — M2550 Pain in unspecified joint: Secondary | ICD-10-CM | POA: Diagnosis not present

## 2015-09-22 DIAGNOSIS — Z79899 Other long term (current) drug therapy: Secondary | ICD-10-CM | POA: Diagnosis not present

## 2015-09-22 DIAGNOSIS — L409 Psoriasis, unspecified: Secondary | ICD-10-CM | POA: Diagnosis not present

## 2015-09-22 DIAGNOSIS — M810 Age-related osteoporosis without current pathological fracture: Secondary | ICD-10-CM | POA: Diagnosis not present

## 2015-09-22 DIAGNOSIS — M069 Rheumatoid arthritis, unspecified: Secondary | ICD-10-CM | POA: Diagnosis not present

## 2015-09-30 DIAGNOSIS — L409 Psoriasis, unspecified: Secondary | ICD-10-CM | POA: Diagnosis not present

## 2015-09-30 DIAGNOSIS — M069 Rheumatoid arthritis, unspecified: Secondary | ICD-10-CM | POA: Diagnosis not present

## 2015-09-30 DIAGNOSIS — Z79899 Other long term (current) drug therapy: Secondary | ICD-10-CM | POA: Diagnosis not present

## 2015-09-30 DIAGNOSIS — M150 Primary generalized (osteo)arthritis: Secondary | ICD-10-CM | POA: Diagnosis not present

## 2015-10-17 ENCOUNTER — Other Ambulatory Visit: Payer: Self-pay | Admitting: Cardiology

## 2015-11-03 DIAGNOSIS — H16223 Keratoconjunctivitis sicca, not specified as Sjogren's, bilateral: Secondary | ICD-10-CM | POA: Diagnosis not present

## 2015-11-03 DIAGNOSIS — H0289 Other specified disorders of eyelid: Secondary | ICD-10-CM | POA: Diagnosis not present

## 2015-11-03 DIAGNOSIS — H10509 Unspecified blepharoconjunctivitis, unspecified eye: Secondary | ICD-10-CM | POA: Diagnosis not present

## 2015-11-14 ENCOUNTER — Other Ambulatory Visit: Payer: Self-pay | Admitting: Cardiology

## 2015-11-24 ENCOUNTER — Encounter: Payer: Self-pay | Admitting: Cardiovascular Disease

## 2015-12-09 ENCOUNTER — Encounter: Payer: Self-pay | Admitting: Cardiovascular Disease

## 2015-12-09 ENCOUNTER — Ambulatory Visit (INDEPENDENT_AMBULATORY_CARE_PROVIDER_SITE_OTHER): Payer: Medicare Other | Admitting: Cardiovascular Disease

## 2015-12-09 VITALS — BP 114/56 | HR 68 | Ht 61.0 in | Wt 138.0 lb

## 2015-12-09 DIAGNOSIS — R072 Precordial pain: Secondary | ICD-10-CM | POA: Diagnosis not present

## 2015-12-09 DIAGNOSIS — R0989 Other specified symptoms and signs involving the circulatory and respiratory systems: Secondary | ICD-10-CM

## 2015-12-09 DIAGNOSIS — R079 Chest pain, unspecified: Secondary | ICD-10-CM | POA: Diagnosis not present

## 2015-12-09 DIAGNOSIS — I779 Disorder of arteries and arterioles, unspecified: Secondary | ICD-10-CM | POA: Diagnosis not present

## 2015-12-09 DIAGNOSIS — I739 Peripheral vascular disease, unspecified: Secondary | ICD-10-CM

## 2015-12-09 NOTE — Patient Instructions (Signed)
Medication Instructions:  Your physician recommends that you continue on your current medications as directed. Please refer to the Current Medication list given to you today.   Labwork: None Ordered   Testing/Procedures: Your physician has requested that you have a carotid duplex. This test is an ultrasound of the carotid arteries in your neck. It looks at blood flow through these arteries that supply the brain with blood. Allow one hour for this exam. There are no restrictions or special instructions.  Your physician has requested that you have a lexiscan myoview. For further information please visit [email or web address]. Please follow instruction sheet, as given.   Follow-Up: Your physician wants you to follow-up in: 6 months with Dr. Acie Fredrickson.  You will receive a reminder letter in the mail two months in advance. If you don't receive a letter, please call our office to schedule the follow-up appointment.   If you need a refill on your cardiac medications before your next appointment, please call your pharmacy.   Thank you for choosing [facility]! Christen Bame, RN [phone]

## 2015-12-09 NOTE — Progress Notes (Signed)
Cardiology Office Note   Date:  12/09/2015   ID:  BESS SALTZMAN, DOB 06-Nov-1933, MRN [id number]  PCP:  [name], MD  Cardiologist:  Mertie Moores, MD   No chief complaint on file.  follow-up palpitations and some chest discomfort in the past     Shelby Walker is a 82 y.o. female who presents today to follow-up a history of mild chest discomfort and palpitations in the past. She is feeling well and not having any significant symptoms.  Recently she had a mild case of shingles. She also had an upper respiratory infection that is being treated and improving.  Dec 09, 2015:  Shelby Walker is seen today for the first time - transfer from [facility] Followed for palpitations with some associated chest pain  Stays active,  Does yard work.   These can last as long as 30 minutes .   No CP or palpitations with yard work . Typically occur at night  The CP starts first and then she has the palpitations  No recent myoview No recent monitor   Past Medical History:  Diagnosis Date  . Arthritis, rheumatoid ([place])   . Bruit    Carotid Doppler showed no significant abnormality     9per patient)  . Chest pain, unspecified    Nuclear, May, 2008, no scar or ischemia  . Diabetes mellitus   . Dyslipidemia   . Ejection fraction    EF 55-60%, echo, February, 2011  . GERD (gastroesophageal reflux disease)   . Mitral regurgitation April 21, 2009   mild,  echo, February, 2011  . Osteoporosis   . Palpitations    possible very brief atrial fibrillation on monitor and possible reentrant tachycardia  . Psoriasis     No past surgical history on file.  Patient Active Problem List   Diagnosis Date Noted  . Preoperative clearance 05/21/2012  . Precordial chest pain   . Palpitations   . Diabetes mellitus   . GERD (gastroesophageal reflux disease)   . Ejection fraction   . Carotid artery disease ([place])   . Mitral regurgitation 04/21/2009  . PSORIASIS 02/09/2009  . RHEUMATOID ARTHRITIS  02/09/2009      Current Outpatient Prescriptions  Medication Sig Dispense Refill  . aspirin 81 MG tablet Take 81 mg by mouth daily.      . Calcium Carbonate-Vitamin D (CALTRATE 600+D) 600-400 MG-UNIT per tablet Take 1 tablet by mouth daily.      . cholecalciferol (VITAMIN D) 400 UNITS TABS Take 400 Units by mouth daily.      Marland Kitchen diltiazem (CARDIZEM CD) 120 MG 24 hr capsule Take 1 capsule (120 mg total) by mouth daily. 30 capsule 0  . esomeprazole (NEXIUM) 40 MG capsule Take 40 mg by mouth daily at 12 noon.    . etanercept (ENBREL) 50 MG/ML injection Inject 50 mg into the skin once a week.    Marland Kitchen glipiZIDE (GLUCOTROL) 10 MG tablet Take 10 mg by mouth 2 (two) times daily before a meal.      . KOMBIGLYZE XR 2.07-998 MG TB24 Take 0.5 tablets by mouth at bedtime.    . Levofloxacin (LEVAQUIN PO) Take 1 tablet by mouth daily. Start 09-06-13 and continue for one (1) week    . metoprolol succinate (TOPROL-XL) 25 MG 24 hr tablet Take 25 mg by mouth daily.      . Multiple Vitamins-Minerals (CENTRUM SILVER ULTRA WOMENS) TABS Take by mouth. 1 tab daily    . NITROSTAT 0.4 MG  SL tablet 1 TABLET UNDER TONGUE AT ONSET OF CHEST PAIN YOU MAY REPEAT EVERY 5 MINUTES FOR UP TO 3 DOSES. 25 tablet 5  . predniSONE (DELTASONE) 10 MG tablet Take 5 mg by mouth daily.     . Saxagliptin-Metformin 2.07-998 MG TB24 Take 1 tablet by mouth daily.     No current facility-administered medications for this visit.     Allergies:   Compazine [prochlorperazine edisylate] and Sulfonamide derivatives    Social History:  The patient  reports that she has never smoked. She has never used smokeless tobacco.   Family History:  The patient's family history includes Diabetes in her father and mother; Heart attack in her father and mother; Heart failure in her father and mother.    ROS:  Please see the history of present illness.     Patient denies fever, chills, headache, sweats, rash, change in vision, change in hearing, chest pain,  cough, nausea or vomiting, urinary symptoms. All other systems are reviewed and are negative.   PHYSICAL EXAM: VS:  BP (!) 114/56 (BP Location: Left Arm, Patient Position: Sitting, Cuff Size: Normal)   Pulse 68   Ht 5\' 1"  (1.549 m)   Wt 138 lb (62.6 kg)   BMI 26.07 kg/m  , Patient is oriented to person time and place. Affect is normal. Head is atraumatic. Sclera and conjunctiva are normal. There is no jugular venous distention. Lungs are clear. Respiratory effort is nonlabored. Cardiac exam reveals S1 and S2. Abdomen is soft. There is no peripheral edema. The patient has deforming which would arthritis it is very obvious in her hands. Neurologic is grossly intact.  EKG:   EKG is normal.   Recent Labs: No results found for requested labs within last 8760 hours.    Lipid Panel No results found for: CHOL, TRIG, HDL, CHOLHDL, VLDL, LDLCALC, LDLDIRECT    Wt Readings from Last 3 Encounters:  12/09/15 138 lb (62.6 kg)  09/08/14 137 lb 12.8 oz (62.5 kg)  08/03/13 142 lb (64.4 kg)      Current medicines are reviewed  The patient understands her medications well.     ASSESSMENT AND PLAN:  1. Chest pain: The patient has episodes of chest pain that occur at various times. These can last for as long as 30 minutes. They're associated with palpitations. It's been several years since we performed a Myoview study. We'll schedule her for a [employer] study.  2. Left carotid bruit: Patient has a left carotid bruit. The scan from 2015 revealed mild carotid disease. We'll repeat the scan.  I'll see her again in 6 month.

## 2015-12-15 ENCOUNTER — Telehealth (HOSPITAL_COMMUNITY): Payer: Self-pay | Admitting: *Deleted

## 2015-12-15 NOTE — Telephone Encounter (Signed)
Patient given detailed instructions per Myocardial Perfusion Study Information Sheet for the test on 12/19/15. Patient notified to arrive 15 minutes early and that it is imperative to arrive on time for appointment to keep from having the test rescheduled.  If you need to cancel or reschedule your appointment, please call the office within 24 hours of your appointment. Failure to do so may result in a cancellation of your appointment, and a $50 no show fee. Patient verbalized understanding. Kirstie Peri

## 2015-12-19 ENCOUNTER — Ambulatory Visit (HOSPITAL_BASED_OUTPATIENT_CLINIC_OR_DEPARTMENT_OTHER): Payer: Medicare Other

## 2015-12-19 ENCOUNTER — Ambulatory Visit (HOSPITAL_COMMUNITY)
Admission: RE | Admit: 2015-12-19 | Discharge: 2015-12-19 | Disposition: A | Discharge status: Home / Self Care | Payer: Medicare Other | Source: Ambulatory Visit | Attending: Internal Medicine | Admitting: Internal Medicine

## 2015-12-19 DIAGNOSIS — R079 Chest pain, unspecified: Secondary | ICD-10-CM

## 2015-12-19 DIAGNOSIS — K219 Gastro-esophageal reflux disease without esophagitis: Secondary | ICD-10-CM | POA: Diagnosis not present

## 2015-12-19 DIAGNOSIS — I6523 Occlusion and stenosis of bilateral carotid arteries: Secondary | ICD-10-CM | POA: Insufficient documentation

## 2015-12-19 DIAGNOSIS — E785 Hyperlipidemia, unspecified: Secondary | ICD-10-CM | POA: Diagnosis not present

## 2015-12-19 DIAGNOSIS — R0989 Other specified symptoms and signs involving the circulatory and respiratory systems: Secondary | ICD-10-CM | POA: Diagnosis not present

## 2015-12-19 DIAGNOSIS — Z8249 Family history of ischemic heart disease and other diseases of the circulatory system: Secondary | ICD-10-CM | POA: Diagnosis not present

## 2015-12-19 DIAGNOSIS — E119 Type 2 diabetes mellitus without complications: Secondary | ICD-10-CM | POA: Diagnosis not present

## 2015-12-19 DIAGNOSIS — I340 Nonrheumatic mitral (valve) insufficiency: Secondary | ICD-10-CM | POA: Diagnosis not present

## 2015-12-19 LAB — MYOCARDIAL PERFUSION IMAGING
CHL CUP RESTING HR STRESS: 66 {beats}/min
LHR: 0.3
LVDIAVOL: 56 mL (ref 46–106)
LVSYSVOL: 13 mL
NUC STRESS TID: 1.02
Peak HR: 92 {beats}/min
SDS: 3
SRS: 9
SSS: 12

## 2015-12-19 MED ORDER — TECHNETIUM TC 99M TETROFOSMIN IV KIT
10.2000 | PACK | Freq: Once | INTRAVENOUS | Status: AC | PRN
Start: 2015-12-19 — End: 2015-12-19
  Administered 2015-12-19: 10.2 via INTRAVENOUS
  Filled 2015-12-19: qty 11

## 2015-12-19 MED ORDER — REGADENOSON 0.4 MG/5ML IV SOLN
0.4000 mg | Freq: Once | INTRAVENOUS | Status: AC
  Administered 2015-12-19: 0.4 mg via INTRAVENOUS

## 2015-12-19 MED ORDER — TECHNETIUM TC 99M TETROFOSMIN IV KIT
31.3000 | PACK | Freq: Once | INTRAVENOUS | Status: AC | PRN
  Administered 2015-12-19: 31.3 via INTRAVENOUS
  Filled 2015-12-19: qty 32

## 2015-12-20 ENCOUNTER — Telehealth: Payer: Self-pay | Admitting: Cardiovascular Disease

## 2015-12-20 NOTE — Telephone Encounter (Signed)
**Note De-Identified  Obfuscation** The pt has been given her Carotid Duplex and Stress Test results. She verbalized understanding.

## 2015-12-20 NOTE — Telephone Encounter (Signed)
New message ° ° ° ° °Returning a call to the nurse °

## 2015-12-22 DIAGNOSIS — M542 Cervicalgia: Secondary | ICD-10-CM | POA: Diagnosis not present

## 2015-12-22 DIAGNOSIS — L409 Psoriasis, unspecified: Secondary | ICD-10-CM | POA: Diagnosis not present

## 2015-12-22 DIAGNOSIS — M810 Age-related osteoporosis without current pathological fracture: Secondary | ICD-10-CM | POA: Diagnosis not present

## 2015-12-22 DIAGNOSIS — Z79899 Other long term (current) drug therapy: Secondary | ICD-10-CM | POA: Diagnosis not present

## 2015-12-22 DIAGNOSIS — M2550 Pain in unspecified joint: Secondary | ICD-10-CM | POA: Diagnosis not present

## 2015-12-22 DIAGNOSIS — M150 Primary generalized (osteo)arthritis: Secondary | ICD-10-CM | POA: Diagnosis not present

## 2015-12-22 DIAGNOSIS — M069 Rheumatoid arthritis, unspecified: Secondary | ICD-10-CM | POA: Diagnosis not present

## 2015-12-22 DIAGNOSIS — R5383 Other fatigue: Secondary | ICD-10-CM | POA: Diagnosis not present

## 2015-12-28 DIAGNOSIS — I10 Essential (primary) hypertension: Secondary | ICD-10-CM | POA: Diagnosis not present

## 2015-12-28 DIAGNOSIS — Z23 Encounter for immunization: Secondary | ICD-10-CM | POA: Diagnosis not present

## 2015-12-28 DIAGNOSIS — E784 Other hyperlipidemia: Secondary | ICD-10-CM | POA: Diagnosis not present

## 2015-12-28 DIAGNOSIS — M069 Rheumatoid arthritis, unspecified: Secondary | ICD-10-CM | POA: Diagnosis not present

## 2015-12-28 DIAGNOSIS — R079 Chest pain, unspecified: Secondary | ICD-10-CM | POA: Diagnosis not present

## 2015-12-28 DIAGNOSIS — K760 Fatty (change of) liver, not elsewhere classified: Secondary | ICD-10-CM | POA: Diagnosis not present

## 2015-12-28 DIAGNOSIS — K219 Gastro-esophageal reflux disease without esophagitis: Secondary | ICD-10-CM | POA: Diagnosis not present

## 2015-12-28 DIAGNOSIS — Z6825 Body mass index (BMI) 25.0-25.9, adult: Secondary | ICD-10-CM | POA: Diagnosis not present

## 2015-12-28 DIAGNOSIS — R0989 Other specified symptoms and signs involving the circulatory and respiratory systems: Secondary | ICD-10-CM | POA: Diagnosis not present

## 2015-12-28 DIAGNOSIS — E1129 Type 2 diabetes mellitus with other diabetic kidney complication: Secondary | ICD-10-CM | POA: Diagnosis not present

## 2015-12-28 DIAGNOSIS — N08 Glomerular disorders in diseases classified elsewhere: Secondary | ICD-10-CM | POA: Diagnosis not present

## 2015-12-28 DIAGNOSIS — M810 Age-related osteoporosis without current pathological fracture: Secondary | ICD-10-CM | POA: Diagnosis not present

## 2016-01-09 ENCOUNTER — Other Ambulatory Visit: Payer: Self-pay | Admitting: Cardiology

## 2016-01-24 ENCOUNTER — Other Ambulatory Visit: Payer: Self-pay | Admitting: Cardiology

## 2016-03-26 DIAGNOSIS — L409 Psoriasis, unspecified: Secondary | ICD-10-CM | POA: Diagnosis not present

## 2016-03-26 DIAGNOSIS — E663 Overweight: Secondary | ICD-10-CM | POA: Diagnosis not present

## 2016-03-26 DIAGNOSIS — M2550 Pain in unspecified joint: Secondary | ICD-10-CM | POA: Diagnosis not present

## 2016-03-26 DIAGNOSIS — M069 Rheumatoid arthritis, unspecified: Secondary | ICD-10-CM | POA: Diagnosis not present

## 2016-03-26 DIAGNOSIS — M150 Primary generalized (osteo)arthritis: Secondary | ICD-10-CM | POA: Diagnosis not present

## 2016-03-26 DIAGNOSIS — M810 Age-related osteoporosis without current pathological fracture: Secondary | ICD-10-CM | POA: Diagnosis not present

## 2016-03-26 DIAGNOSIS — Z79899 Other long term (current) drug therapy: Secondary | ICD-10-CM | POA: Diagnosis not present

## 2016-03-26 DIAGNOSIS — M67912 Unspecified disorder of synovium and tendon, left shoulder: Secondary | ICD-10-CM | POA: Diagnosis not present

## 2016-03-26 DIAGNOSIS — Z6825 Body mass index (BMI) 25.0-25.9, adult: Secondary | ICD-10-CM | POA: Diagnosis not present

## 2016-05-09 DIAGNOSIS — M810 Age-related osteoporosis without current pathological fracture: Secondary | ICD-10-CM | POA: Diagnosis not present

## 2016-05-09 DIAGNOSIS — D649 Anemia, unspecified: Secondary | ICD-10-CM | POA: Diagnosis not present

## 2016-05-09 DIAGNOSIS — M5416 Radiculopathy, lumbar region: Secondary | ICD-10-CM | POA: Diagnosis not present

## 2016-05-09 DIAGNOSIS — N08 Glomerular disorders in diseases classified elsewhere: Secondary | ICD-10-CM | POA: Diagnosis not present

## 2016-05-09 DIAGNOSIS — Z6823 Body mass index (BMI) 23.0-23.9, adult: Secondary | ICD-10-CM | POA: Diagnosis not present

## 2016-05-09 DIAGNOSIS — Z1389 Encounter for screening for other disorder: Secondary | ICD-10-CM | POA: Diagnosis not present

## 2016-05-09 DIAGNOSIS — E1129 Type 2 diabetes mellitus with other diabetic kidney complication: Secondary | ICD-10-CM | POA: Diagnosis not present

## 2016-05-09 DIAGNOSIS — E784 Other hyperlipidemia: Secondary | ICD-10-CM | POA: Diagnosis not present

## 2016-05-09 DIAGNOSIS — M069 Rheumatoid arthritis, unspecified: Secondary | ICD-10-CM | POA: Diagnosis not present

## 2016-05-09 DIAGNOSIS — R0989 Other specified symptoms and signs involving the circulatory and respiratory systems: Secondary | ICD-10-CM | POA: Diagnosis not present

## 2016-05-09 DIAGNOSIS — I10 Essential (primary) hypertension: Secondary | ICD-10-CM | POA: Diagnosis not present

## 2016-07-23 DIAGNOSIS — Z79899 Other long term (current) drug therapy: Secondary | ICD-10-CM | POA: Diagnosis not present

## 2016-07-23 DIAGNOSIS — M810 Age-related osteoporosis without current pathological fracture: Secondary | ICD-10-CM | POA: Diagnosis not present

## 2016-07-23 DIAGNOSIS — M2550 Pain in unspecified joint: Secondary | ICD-10-CM | POA: Diagnosis not present

## 2016-07-23 DIAGNOSIS — L409 Psoriasis, unspecified: Secondary | ICD-10-CM | POA: Diagnosis not present

## 2016-07-23 DIAGNOSIS — M150 Primary generalized (osteo)arthritis: Secondary | ICD-10-CM | POA: Diagnosis not present

## 2016-07-23 DIAGNOSIS — Z6825 Body mass index (BMI) 25.0-25.9, adult: Secondary | ICD-10-CM | POA: Diagnosis not present

## 2016-07-23 DIAGNOSIS — M67912 Unspecified disorder of synovium and tendon, left shoulder: Secondary | ICD-10-CM | POA: Diagnosis not present

## 2016-07-23 DIAGNOSIS — M069 Rheumatoid arthritis, unspecified: Secondary | ICD-10-CM | POA: Diagnosis not present

## 2016-07-23 DIAGNOSIS — E663 Overweight: Secondary | ICD-10-CM | POA: Diagnosis not present

## 2016-08-03 ENCOUNTER — Encounter: Payer: Self-pay | Admitting: *Deleted

## 2016-08-09 ENCOUNTER — Ambulatory Visit (INDEPENDENT_AMBULATORY_CARE_PROVIDER_SITE_OTHER): Payer: Medicare Other | Admitting: Cardiovascular Disease

## 2016-08-09 ENCOUNTER — Encounter: Payer: Self-pay | Admitting: Cardiovascular Disease

## 2016-08-09 VITALS — BP 122/60 | HR 70 | Ht 61.5 in | Wt 133.4 lb

## 2016-08-09 DIAGNOSIS — I739 Peripheral vascular disease, unspecified: Secondary | ICD-10-CM | POA: Insufficient documentation

## 2016-08-09 DIAGNOSIS — I779 Disorder of arteries and arterioles, unspecified: Secondary | ICD-10-CM | POA: Diagnosis not present

## 2016-08-09 NOTE — Progress Notes (Signed)
Cardiology Office Note   Date:  08/09/2016   ID:  Shelby Walker, DOB Jun 08, 1933, MRN [id number]  PCP:  Reynold Bowen, MD  Cardiologist:  Mertie Moores, MD   Chief Complaint  Patient presents with  . Follow-up    carotid artery disease   follow-up palpitations and some chest discomfort in the past     Shelby Walker is a 83 y.o. female who presents today to follow-up a history of mild chest discomfort and palpitations in the past. She is feeling well and not having any significant symptoms.  Recently she had a mild case of shingles. She also had an upper respiratory infection that is being treated and improving.  Dec 09, 2015:  Shelby Walker is seen today for the first time - transfer from [facility] Followed for palpitations with some associated chest pain  Stays active,  Does yard work.   These can last as long as 30 minutes .   No CP or palpitations with yard work . Typically occur at night  The CP starts first and then she has the palpitations  No recent myoview No recent monitor   August 09, 2016:  Shelby Walker is seen for follow up of her carotid disease  She had been having some chest pain . Myoview was low risk.  Still very active,  No further CP .   Has moderate carotid disease Has some burning in her legs with walking and at night    Past Medical History:  Diagnosis Date  . Arthritis, rheumatoid ([place])   . Bruit    Carotid Doppler showed no significant abnormality     9per patient)  . Chest pain, unspecified    Nuclear, May, 2008, no scar or ischemia  . Diabetes mellitus   . Dyslipidemia   . Ejection fraction    EF 55-60%, echo, February, 2011  . GERD (gastroesophageal reflux disease)   . Mitral regurgitation April 21, 2009   mild,  echo, February, 2011  . Osteoporosis   . Palpitations    possible very brief atrial fibrillation on monitor and possible reentrant tachycardia  . Psoriasis     No past surgical history on file.  Patient Active Problem List   Diagnosis Date Noted  . Preoperative clearance 05/21/2012  . Precordial chest pain   . Palpitations   . Diabetes mellitus   . GERD (gastroesophageal reflux disease)   . Ejection fraction   . Carotid artery disease ([place])   . Mitral regurgitation 04/21/2009  . PSORIASIS 02/09/2009  . RHEUMATOID ARTHRITIS 02/09/2009      Current Outpatient Prescriptions  Medication Sig Dispense Refill  . aspirin 81 MG tablet Take 81 mg by mouth daily.      . Calcium Carbonate-Vitamin D (CALTRATE 600+D) 600-400 MG-UNIT per tablet Take 1 tablet by mouth daily.      . cholecalciferol (VITAMIN D) 400 UNITS TABS Take 400 Units by mouth daily.      Marland Kitchen diltiazem (TIAZAC) 120 MG 24 hr capsule TAKE 1 CAPSULE (120 MG TOTAL) BY MOUTH DAILY. 90 capsule 3  . esomeprazole (NEXIUM) 40 MG capsule Take 40 mg by mouth daily at 12 noon.    . etanercept (ENBREL) 50 MG/ML injection Inject 50 mg into the skin once a week.    Marland Kitchen glipiZIDE (GLUCOTROL) 10 MG tablet Take 10 mg by mouth 2 (two) times daily before a meal.      . KOMBIGLYZE XR 2.07-998 MG TB24 Take 0.5 tablets by mouth at bedtime.    Marland Kitchen  metoprolol succinate (TOPROL-XL) 25 MG 24 hr tablet Take 25 mg by mouth daily.      . Multiple Vitamins-Minerals (CENTRUM SILVER ULTRA WOMENS) TABS Take by mouth. 1 tab daily    . NITROSTAT 0.4 MG SL tablet 1 TABLET UNDER TONGUE AT ONSET OF CHEST PAIN YOU MAY REPEAT EVERY 5 MINUTES FOR UP TO 3 DOSES. 25 tablet 5  . predniSONE (DELTASONE) 10 MG tablet Take 5 mg by mouth daily.     . Saxagliptin-Metformin 2.07-998 MG TB24 Take 1 tablet by mouth daily.     No current facility-administered medications for this visit.     Allergies:   Compazine [prochlorperazine edisylate] and Sulfonamide derivatives    Social History:  The patient  reports that she has never smoked. She has never used smokeless tobacco.   Family History:  The patient's family history includes Diabetes in her father and mother; Heart attack in her father and mother;  Heart failure in her father and mother.    ROS:  Please see the history of present illness.     Patient denies fever, chills, headache, sweats, rash, change in vision, change in hearing, chest pain, cough, nausea or vomiting, urinary symptoms. All other systems are reviewed and are negative.   PHYSICAL EXAM: VS:  BP 122/60   Pulse 70   Ht 5' 1.5" (1.562 m)   Wt 133 lb 6 oz (60.5 kg)   SpO2 95%   BMI 24.79 kg/m  , Patient is oriented to person time and place. Affect is normal. Head is atraumatic. Sclera and conjunctiva are normal. There is no jugular venous distention. Lungs are clear. Respiratory effort is nonlabored. Cardiac exam reveals S1 and S2.  1+ bilateral leg edema.    Reduced distal foot pulses.   Abdomen is soft. There is no peripheral edema. The patient has deforming which would arthritis it is very obvious in her hands. Neurologic is grossly intact.  EKG:    Recent Labs: No results found for requested labs within last 8760 hours.    Lipid Panel No results found for: CHOL, TRIG, HDL, CHOLHDL, VLDL, LDLCALC, LDLDIRECT    Wt Readings from Last 3 Encounters:  08/09/16 133 lb 6 oz (60.5 kg)  12/09/15 138 lb (62.6 kg)  09/08/14 137 lb 12.8 oz (62.5 kg)      Current medicines are reviewed  The patient understands her medications well.     ASSESSMENT AND PLAN:  1. Chest pain: The patient Has not had any further episodes of chest discomfort. Her Myoview study from last year was normal. No further workup indicated at this point.  2. Left carotid bruit: Patient has a left carotid bruit.  She has moderate carotid artery disease.  We'll continue to follow.  3. Claudication: Complaints of claudication symptoms with exercise and also complains of some leg pain at night. We'll get a lower extremity arterial duplex scan.  I'll see her again in 6 month.  Mertie Moores, MD  08/09/2016 2:10 PM    [facility] [place],  [place], [place]  [address] Pager [phone] Phone: [phone]; Fax: [phone]

## 2016-08-09 NOTE — Patient Instructions (Signed)
Medication Instructions:  Your physician recommends that you continue on your current medications as directed. Please refer to the Current Medication list given to you today.   Labwork: None Ordered   Testing/Procedures: Your physician has requested that you have a lower extremity arterial duplex. This test is an ultrasound of the arteries in the legs or arms. It looks at arterial blood flow in the legs and arms. Allow one hour for Lower and Upper Arterial scans. There are no restrictions or special instructions   Follow-Up: Your physician wants you to follow-up in: 6 months with Dr. Acie Fredrickson.  You will receive a reminder letter in the mail two months in advance. If you don't receive a letter, please call our office to schedule the follow-up appointment.   If you need a refill on your cardiac medications before your next appointment, please call your pharmacy.   Thank you for choosing [facility]! Christen Bame, RN [phone]

## 2016-08-15 ENCOUNTER — Other Ambulatory Visit: Payer: Self-pay | Admitting: Cardiovascular Disease

## 2016-08-15 DIAGNOSIS — E784 Other hyperlipidemia: Secondary | ICD-10-CM | POA: Diagnosis not present

## 2016-08-15 DIAGNOSIS — N08 Glomerular disorders in diseases classified elsewhere: Secondary | ICD-10-CM | POA: Diagnosis not present

## 2016-08-15 DIAGNOSIS — Z6824 Body mass index (BMI) 24.0-24.9, adult: Secondary | ICD-10-CM | POA: Diagnosis not present

## 2016-08-15 DIAGNOSIS — E559 Vitamin D deficiency, unspecified: Secondary | ICD-10-CM | POA: Diagnosis not present

## 2016-08-15 DIAGNOSIS — M069 Rheumatoid arthritis, unspecified: Secondary | ICD-10-CM | POA: Diagnosis not present

## 2016-08-15 DIAGNOSIS — I739 Peripheral vascular disease, unspecified: Secondary | ICD-10-CM

## 2016-08-15 DIAGNOSIS — Z1389 Encounter for screening for other disorder: Secondary | ICD-10-CM | POA: Diagnosis not present

## 2016-08-15 DIAGNOSIS — R079 Chest pain, unspecified: Secondary | ICD-10-CM | POA: Diagnosis not present

## 2016-08-15 DIAGNOSIS — E1129 Type 2 diabetes mellitus with other diabetic kidney complication: Secondary | ICD-10-CM | POA: Diagnosis not present

## 2016-08-15 DIAGNOSIS — R0989 Other specified symptoms and signs involving the circulatory and respiratory systems: Secondary | ICD-10-CM | POA: Diagnosis not present

## 2016-08-15 DIAGNOSIS — I10 Essential (primary) hypertension: Secondary | ICD-10-CM | POA: Diagnosis not present

## 2016-08-27 ENCOUNTER — Ambulatory Visit (HOSPITAL_COMMUNITY)
Admission: RE | Admit: 2016-08-27 | Discharge: 2016-08-27 | Disposition: A | Discharge status: Home / Self Care | Payer: Medicare Other | Source: Ambulatory Visit | Attending: Cardiology | Admitting: Cardiology

## 2016-08-27 DIAGNOSIS — I739 Peripheral vascular disease, unspecified: Secondary | ICD-10-CM | POA: Diagnosis not present

## 2016-09-17 ENCOUNTER — Encounter: Payer: Self-pay | Admitting: *Deleted

## 2016-09-28 ENCOUNTER — Ambulatory Visit: Payer: Medicare Other | Admitting: Internal Medicine

## 2016-09-28 ENCOUNTER — Encounter: Payer: Self-pay | Admitting: Internal Medicine

## 2016-09-28 ENCOUNTER — Encounter (INDEPENDENT_AMBULATORY_CARE_PROVIDER_SITE_OTHER): Payer: Self-pay

## 2016-09-28 ENCOUNTER — Ambulatory Visit (INDEPENDENT_AMBULATORY_CARE_PROVIDER_SITE_OTHER): Payer: Medicare Other | Admitting: Internal Medicine

## 2016-09-28 VITALS — BP 126/60 | HR 77 | Ht 61.5 in | Wt 135.0 lb

## 2016-09-28 DIAGNOSIS — I739 Peripheral vascular disease, unspecified: Secondary | ICD-10-CM

## 2016-09-28 DIAGNOSIS — I208 Other forms of angina pectoris: Secondary | ICD-10-CM | POA: Diagnosis not present

## 2016-09-28 DIAGNOSIS — E782 Mixed hyperlipidemia: Secondary | ICD-10-CM | POA: Diagnosis not present

## 2016-09-28 MED ORDER — ATORVASTATIN CALCIUM 20 MG PO TABS: 20.0000 mg | ORAL_TABLET | Freq: Every day | ORAL | 1 refills | Status: DC

## 2016-09-28 NOTE — Progress Notes (Signed)
New Outpatient Visit Date: 09/28/2016  Referring Provider: Mertie Moores, MD [facility]  Chief Complaint: Leg pain  HPI:  Shelby Walker is a 83 y.o. female who is being seen today for the evaluation of leg pain and abnormal vascular studies at the request of Dr. Acie Fredrickson. She has a history of diabetes mellitus, dysplipidemia, and mitral regurgitation. She reports pain in both legs for over a year. She describes an aching discomfort in her thighs it occurs off and on throughout the day. Some days it is present almost all the time and others she is without symptoms. The patient does not clearly worsen with activity, though Shelby Walker notes that the pain will often improve or resolve when she is down and elevate her legs. She also notes occasional discoloration in her legs, which appear red at times. She does not have any sores on her feet, but notes that her toes and fingers are quite disfigured secondary to rheumatoid arthritis. She does not have a history of peripheral vascular disease, but recently had abnormal vascular studies. She notes occasional swelling in her legs, right greater than left, which improves with elevation. She denies a history of prior DVT.  Shelby Walker also has intermittent palpitations and chest discomfort that occur randomly and have been stable for at least 4-5 years. The last episode was over a month ago. She has not had any shortness of breath. She reports being compliant with her medications. She has not had any significant bleeding.  --------------------------------------------------------------------------------------------------  Cardiovascular History & Procedures: Cardiovascular Problems:  Peripheral vascular disease  Mitral regurgitation  Carotid artery stenosis  Risk Factors:  Peripheral and cerebrovascular disease, hyperlipidemia, diabetes mellitus, and age > 20  Cath/PCI:  None  CV Surgery:  None  EP Procedures and  Devices:  None  Non-Invasive Evaluation(s):  Lower extremity arterial duplex (08/27/16): Technically challenging study. >50% right common iliac artery stenosis. 50-74% right distal popliteal artery stenosis. 50-74% left proximal to mid SFA stenosis.Three vessel run-off, bilaterally. ABI's: right 0.91, left 0.97; TBI's right 0.50, left 0.57.  Bilateral carotid Doppler (12/19/15): Heterogeneous plaque, bilaterally. Stable 40-59% bilateral ICA stenosis. Normal subclavian arteries, bilaterally. Patent vertebral arteries with antegrade flow.  Pharmacologic MPI (12/19/15): Low risk study without ischemia or scar. LVEF 77%.  --------------------------------------------------------------------------------------------------  Past Medical History:  Diagnosis Date  . Arthritis, rheumatoid ([place])   . Bruit    Carotid Doppler showed no significant abnormality     9per patient)  . Chest pain, unspecified    Nuclear, May, 2008, no scar or ischemia  . Diabetes mellitus   . Dyslipidemia   . Ejection fraction    EF 55-60%, echo, February, 2011  . GERD (gastroesophageal reflux disease)   . Mitral regurgitation April 21, 2009   mild,  echo, February, 2011  . Osteoporosis   . Palpitations    possible very brief atrial fibrillation on monitor and possible reentrant tachycardia  . Psoriasis     No past surgical history on file.  Current Meds  Medication Sig  . aspirin 81 MG tablet Take 81 mg by mouth daily.    . Calcium Carbonate-Vitamin D (CALTRATE 600+D) 600-400 MG-UNIT per tablet Take 1 tablet by mouth daily.    . cholecalciferol (VITAMIN D) 400 UNITS TABS Take 400 Units by mouth daily.    Marland Kitchen diltiazem (TIAZAC) 120 MG 24 hr capsule TAKE 1 CAPSULE (120 MG TOTAL) BY MOUTH DAILY.  Marland Kitchen esomeprazole (NEXIUM) 40 MG capsule Take 40 mg by mouth daily at 12  noon.  . etanercept (ENBREL) 50 MG/ML injection Inject 50 mg into the skin once a week.  Marland Kitchen glipiZIDE (GLUCOTROL) 10 MG tablet Take 10 mg by mouth 2  (two) times daily before a meal.    . metoprolol succinate (TOPROL-XL) 25 MG 24 hr tablet Take 25 mg by mouth daily.    . Multiple Vitamins-Minerals (CENTRUM SILVER ULTRA WOMENS) TABS Take by mouth. 1 tab daily  . NITROSTAT 0.4 MG SL tablet 1 TABLET UNDER TONGUE AT ONSET OF CHEST PAIN YOU MAY REPEAT EVERY 5 MINUTES FOR UP TO 3 DOSES.  Marland Kitchen predniSONE (DELTASONE) 5 MG tablet Take 5 mg by mouth daily.  . sitaGLIPtin-metformin (JANUMET) 50-1000 MG tablet Take 1 tablet by mouth daily.    Allergies: Compazine [prochlorperazine edisylate] and Sulfonamide derivatives  Social History   Social History  . Marital status: Married    Spouse name: N/A  . Number of children: N/A  . Years of education: N/A   Occupational History  . Not on file.   Social History Main Topics  . Smoking status: Never Smoker  . Smokeless tobacco: Never Used  . Alcohol use Not on file  . Drug use: Unknown  . Sexual activity: Yes   Other Topics Concern  . Not on file   Social History Narrative  . No narrative on file    Family History  Problem Relation Age of Onset  . Heart failure Father   . Heart attack Father   . Diabetes Father   . Heart attack Mother   . Heart failure Mother   . Diabetes Mother     Review of Systems: Review of systems is notable for intermittent orthostatic lightheadedness and easy bruising. Otherwise, a 12-system review of systems was performed and was negative except as noted in the HPI.  --------------------------------------------------------------------------------------------------  Physical Exam: BP 126/60   Pulse 77   Ht 5' 1.5" (1.562 m)   Wt 135 lb (61.2 kg)   SpO2 96%   BMI 25.09 kg/m   General:  Well-developed, well-nourished woman, seated comfortably in the exam room. HEENT: No conjunctival pallor or scleral icterus. Moist mucous membranes. OP clear. Neck: Supple without lymphadenopathy, thyromegaly, JVD, or HJR. No carotid bruit. Lungs: Normal work of  breathing. Clear to auscultation bilaterally without wheezes or crackles. Heart: Regular rate and rhythm without murmurs, rubs, or gallops. Non-displaced PMI. Abd: Bowel sounds present. Soft, NT/ND without hepatosplenomegaly Ext: 2+ right and 1+ left pretibial and ankle edema. 2+ radial and femoral pulses bilaterally. 1+ PT and DP pulses bilaterally. Skin: Warm and dry. Darkening of bilateral calves suggestive of hemosiderin deposition. No cyanosis or pallor. Neuro: CNIII-XII intact. Strength and fine-touch sensation intact in upper and lower extremities bilaterally. Psych: Normal mood and affect.  EKG:  NSR without significant abnormalities or changes from prior tacing in 12/2015.  Outside labs: 08/15/16: BUN 30, creatinine 1.2, hemoglobin A1c 6.9%  12/28/15: Total cholesterol 260, triglycerides 246, LDL 162, HDL 49  --------------------------------------------------------------------------------------------------  ASSESSMENT AND PLAN: Peripheral vascular disease with claudication Ms. Dusing symptoms are somewhat atypical, given that her leg pain waxes and wanes and is present some days even with minimal activity and is absent on other days (even when walking). Vascular studies demonstrate at least moderate inflow and outflow disease in both legs with borderline low ABIs, particularly on the right. She has palpable pedal pulses palpable on exam today, albeit somewhat diminished. She does not have any findings of critical limb ischemia. We discussed further evaluation and treatment options,  including angiography, walking program, and medical therapy. Shelby Walker would like to defer invasive procedures, if possible. I have encouraged her to increase her walking. Once it becomes available, it may be beneficial for Shelby Walker to participate in our supervised walking program. We discussed adding cilostazol but have agreed to defer this for the time being. I have asked her to begin taking atorvastatin 20  mg daily for secondary prevention, particularly given an LDL of 162 last October. She should continue low-dose aspirin.  I advised her to contact us if her symptoms worsen or she develops wounds on her feet.  Hyperlipidemia As above, we will start atorvastatin 20 mg daily for secondary prevention of PAD, given LDL of 162 last October. We will plan to recheck a lipid panel and ALT in about 3 months.  Stable angina Atypical chest pain and palpitations have been stable for several years. Myocardial perfusion stress test last year was low risk. No further interventions at this time. I will defer ongoing management to Dr. Acie Fredrickson.  Follow-up: Return to clinic in 3 months.  Nelva Bush, MD 09/28/2016 1:40 PM

## 2016-09-28 NOTE — Patient Instructions (Signed)
Medication Instructions:  Start lipitor 20 mg daily  Labwork: Your physician recommends that you return for a FASTING lipid profile /alt in 3 months a few days before your appointment with Dr End in 3 months.   Testing/Procedures: none  Follow-Up: Your physician recommends that you schedule a follow-up appointment in: 3 months with Dr End.     Any Other Special Instructions Will Be Listed Below (If Applicable).  WALKING  Walking is a great form of exercise to increase your strength, endurance and overall fitness.  A walking program can help you start slowly and gradually build endurance as you go.  Everyone's ability is different, so each person's starting point will be different.  You do not have to follow them exactly.  The are just samples. You should simply find out what's right for you and stick to that program.   In the beginning, you'll start off walking 2-3 times a day for short distances.  As you get stronger, you'll be walking further at just 1-2 times per day.  A. You Can Walk For A Certain Length Of Time Each Day    Walk 5 minutes 3 times per day.  Increase 2 minutes every 2 days (3 times per day).  Work up to 25-30 minutes (1-2 times per day).   Example:   Day 1-2 5 minutes 3 times per day   Day 7-8 12 minutes 2-3 times per day   Day 13-14 25 minutes 1-2 times per day  B. You Can Walk For a Certain Distance Each Day     Distance can be substituted for time.    Example:   3 trips to mailbox (at road)   3 trips to corner of block   3 trips around the block  C. Go to local [employer] and use the track.         If you need a refill on your cardiac medications before your next appointment, please call your pharmacy.

## 2016-09-29 ENCOUNTER — Encounter: Payer: Self-pay | Admitting: Internal Medicine

## 2016-09-29 DIAGNOSIS — I208 Other forms of angina pectoris: Secondary | ICD-10-CM | POA: Insufficient documentation

## 2016-09-29 DIAGNOSIS — E782 Mixed hyperlipidemia: Secondary | ICD-10-CM | POA: Insufficient documentation

## 2016-09-29 DIAGNOSIS — I2089 Other forms of angina pectoris: Secondary | ICD-10-CM | POA: Insufficient documentation

## 2016-10-29 DIAGNOSIS — M2550 Pain in unspecified joint: Secondary | ICD-10-CM | POA: Diagnosis not present

## 2016-10-29 DIAGNOSIS — M150 Primary generalized (osteo)arthritis: Secondary | ICD-10-CM | POA: Diagnosis not present

## 2016-10-29 DIAGNOSIS — E663 Overweight: Secondary | ICD-10-CM | POA: Diagnosis not present

## 2016-10-29 DIAGNOSIS — M069 Rheumatoid arthritis, unspecified: Secondary | ICD-10-CM | POA: Diagnosis not present

## 2016-10-29 DIAGNOSIS — Z79899 Other long term (current) drug therapy: Secondary | ICD-10-CM | POA: Diagnosis not present

## 2016-10-29 DIAGNOSIS — L409 Psoriasis, unspecified: Secondary | ICD-10-CM | POA: Diagnosis not present

## 2016-10-29 DIAGNOSIS — M810 Age-related osteoporosis without current pathological fracture: Secondary | ICD-10-CM | POA: Diagnosis not present

## 2016-10-29 DIAGNOSIS — Z6825 Body mass index (BMI) 25.0-25.9, adult: Secondary | ICD-10-CM | POA: Diagnosis not present

## 2016-11-09 DIAGNOSIS — H16223 Keratoconjunctivitis sicca, not specified as Sjogren's, bilateral: Secondary | ICD-10-CM | POA: Diagnosis not present

## 2016-11-09 DIAGNOSIS — E119 Type 2 diabetes mellitus without complications: Secondary | ICD-10-CM | POA: Diagnosis not present

## 2016-11-09 DIAGNOSIS — H04123 Dry eye syndrome of bilateral lacrimal glands: Secondary | ICD-10-CM | POA: Diagnosis not present

## 2016-11-12 DIAGNOSIS — H9202 Otalgia, left ear: Secondary | ICD-10-CM | POA: Diagnosis not present

## 2016-11-12 DIAGNOSIS — Z6823 Body mass index (BMI) 23.0-23.9, adult: Secondary | ICD-10-CM | POA: Diagnosis not present

## 2016-11-12 DIAGNOSIS — H6122 Impacted cerumen, left ear: Secondary | ICD-10-CM | POA: Diagnosis not present

## 2016-12-13 ENCOUNTER — Other Ambulatory Visit: Payer: Self-pay | Admitting: Internal Medicine

## 2016-12-13 DIAGNOSIS — I6523 Occlusion and stenosis of bilateral carotid arteries: Secondary | ICD-10-CM

## 2016-12-24 ENCOUNTER — Other Ambulatory Visit: Payer: Medicare Other | Admitting: *Deleted

## 2016-12-24 DIAGNOSIS — I10 Essential (primary) hypertension: Secondary | ICD-10-CM | POA: Diagnosis not present

## 2016-12-24 DIAGNOSIS — E7849 Other hyperlipidemia: Secondary | ICD-10-CM | POA: Diagnosis not present

## 2016-12-24 DIAGNOSIS — Z6823 Body mass index (BMI) 23.0-23.9, adult: Secondary | ICD-10-CM | POA: Diagnosis not present

## 2016-12-24 DIAGNOSIS — E559 Vitamin D deficiency, unspecified: Secondary | ICD-10-CM | POA: Diagnosis not present

## 2016-12-24 DIAGNOSIS — E1129 Type 2 diabetes mellitus with other diabetic kidney complication: Secondary | ICD-10-CM | POA: Diagnosis not present

## 2016-12-24 DIAGNOSIS — I739 Peripheral vascular disease, unspecified: Secondary | ICD-10-CM

## 2016-12-24 DIAGNOSIS — R0789 Other chest pain: Secondary | ICD-10-CM | POA: Diagnosis not present

## 2016-12-24 DIAGNOSIS — E782 Mixed hyperlipidemia: Secondary | ICD-10-CM | POA: Diagnosis not present

## 2016-12-24 DIAGNOSIS — N08 Glomerular disorders in diseases classified elsewhere: Secondary | ICD-10-CM | POA: Diagnosis not present

## 2016-12-24 DIAGNOSIS — M810 Age-related osteoporosis without current pathological fracture: Secondary | ICD-10-CM | POA: Diagnosis not present

## 2016-12-24 DIAGNOSIS — M0689 Other specified rheumatoid arthritis, multiple sites: Secondary | ICD-10-CM | POA: Diagnosis not present

## 2016-12-24 DIAGNOSIS — R0989 Other specified symptoms and signs involving the circulatory and respiratory systems: Secondary | ICD-10-CM | POA: Diagnosis not present

## 2016-12-24 DIAGNOSIS — K219 Gastro-esophageal reflux disease without esophagitis: Secondary | ICD-10-CM | POA: Diagnosis not present

## 2016-12-24 DIAGNOSIS — N739 Female pelvic inflammatory disease, unspecified: Secondary | ICD-10-CM | POA: Diagnosis not present

## 2016-12-24 LAB — LIPID PANEL
CHOLESTEROL TOTAL: 129 mg/dL (ref 100–199)
Chol/HDL Ratio: 2.5 ratio (ref 0.0–4.4)
HDL: 52 mg/dL (ref >39)
LDL Calculated: 47 mg/dL (ref 0–99)
TRIGLYCERIDES: 151 mg/dL — AB (ref 0–149)
VLDL CHOLESTEROL CAL: 30 mg/dL (ref 5–40)

## 2016-12-24 LAB — ALT: ALT: 13 IU/L (ref 0–32)

## 2016-12-24 NOTE — Addendum Note (Signed)
Addended by: Eulis Foster on: 12/24/2016 11:31 AM   Modules accepted: Orders

## 2016-12-26 ENCOUNTER — Telehealth: Payer: Self-pay | Admitting: Internal Medicine

## 2016-12-26 NOTE — Telephone Encounter (Signed)
Spoke with pt and went over lab results per Dr. Saunders Revel.  Pt verbalized understanding and was appreciative for call.

## 2016-12-26 NOTE — Telephone Encounter (Signed)
New Message     Pt is calling to get lab results, Webb Silversmith called her yesterday

## 2016-12-28 DIAGNOSIS — Z23 Encounter for immunization: Secondary | ICD-10-CM | POA: Diagnosis not present

## 2016-12-31 ENCOUNTER — Ambulatory Visit (HOSPITAL_COMMUNITY)
Admission: RE | Admit: 2016-12-31 | Discharge: 2016-12-31 | Disposition: A | Discharge status: Home / Self Care | Payer: Medicare Other | Source: Ambulatory Visit | Attending: Cardiovascular Disease | Admitting: Cardiovascular Disease

## 2016-12-31 ENCOUNTER — Encounter: Payer: Self-pay | Admitting: Internal Medicine

## 2016-12-31 ENCOUNTER — Ambulatory Visit (INDEPENDENT_AMBULATORY_CARE_PROVIDER_SITE_OTHER): Payer: Medicare Other | Admitting: Internal Medicine

## 2016-12-31 ENCOUNTER — Encounter (HOSPITAL_COMMUNITY): Payer: Medicare Other

## 2016-12-31 VITALS — BP 118/70 | HR 59 | Ht 61.5 in | Wt 132.4 lb

## 2016-12-31 DIAGNOSIS — I739 Peripheral vascular disease, unspecified: Secondary | ICD-10-CM | POA: Diagnosis not present

## 2016-12-31 DIAGNOSIS — E782 Mixed hyperlipidemia: Secondary | ICD-10-CM

## 2016-12-31 DIAGNOSIS — I208 Other forms of angina pectoris: Secondary | ICD-10-CM | POA: Diagnosis not present

## 2016-12-31 DIAGNOSIS — I6523 Occlusion and stenosis of bilateral carotid arteries: Secondary | ICD-10-CM | POA: Insufficient documentation

## 2016-12-31 DIAGNOSIS — E785 Hyperlipidemia, unspecified: Secondary | ICD-10-CM | POA: Diagnosis not present

## 2016-12-31 DIAGNOSIS — E119 Type 2 diabetes mellitus without complications: Secondary | ICD-10-CM | POA: Diagnosis not present

## 2016-12-31 DIAGNOSIS — I340 Nonrheumatic mitral (valve) insufficiency: Secondary | ICD-10-CM | POA: Diagnosis not present

## 2016-12-31 MED ORDER — NITROGLYCERIN 0.4 MG SL SUBL: SUBLINGUAL_TABLET | SUBLINGUAL | 5 refills | Status: DC

## 2016-12-31 NOTE — Progress Notes (Signed)
Follow-up Outpatient Visit Date: 12/31/2016  Primary Care Provider: Reynold Bowen, [place] [address]  Chief Complaint: Follow-up claudication  HPI:  Shelby Walker is a 83 y.o. year-old female with history of peripheral vascular disease with noninvasive studies, diabetes mellitus, dyslipidemia, and mitral regurgitation, who presents for follow-up of claudication.  I met her in July after she had abnormal peripheral vascular studies.  Her leg pain was somewhat atypical, as she described aching in her thighs off and on throughout the day without a significant exertional component.  We agreed to defer additional testing in favor of increased walking started atorvastatin 20 mg daily to prevent progression of disease.  Today, Ms. Quinteros reports that she has been feeling quite well with only slight pain in her legs.  She noted one episode recently while at the beach.  She had been up walking for several hours and noted some achiness in her calves.  This resolved after she sat down to rest.  She is able to walk around the house and at the store without any significant limitation.  Ms. Paone is tolerating atorvastatin well.  She has not had any chest pain, shortness of breath, or palpitations.  She has not needed to use sublingual nitroglycerin in at least 4-5 months.  --------------------------------------------------------------------------------------------------  Cardiovascular History & Procedures: Cardiovascular Problems:  Peripheral vascular disease  Mitral regurgitation  Carotid artery stenosis  Risk Factors:  Peripheral and cerebrovascular disease, hyperlipidemia, diabetes mellitus, and age > 68  Cath/PCI:  None  CV Surgery:  None  EP Procedures and Devices:  None  Non-Invasive Evaluation(s):  Lower extremity arterial duplex (08/27/16): Technically challenging study. >50% right common iliac artery stenosis. 50-74% right distal popliteal artery  stenosis. 50-74% left proximal to mid SFA stenosis.Three vessel run-off, bilaterally. ABI's: right 0.91, left 0.97; TBI's right 0.50, left 0.57.  Bilateral carotid Doppler (12/19/15): Heterogeneous plaque, bilaterally. Stable 40-59% bilateral ICA stenosis. Normal subclavian arteries, bilaterally. Patent vertebral arteries with antegrade flow.  Pharmacologic MPI (12/19/15): Low risk study without ischemia or scar. LVEF 77%.  Recent CV Pertinent Labs: Lab Results  Component Value Date   CHOL 129 12/24/2016   HDL 52 12/24/2016   LDLCALC 47 12/24/2016   TRIG 151 (H) 12/24/2016   CHOLHDL 2.5 12/24/2016    Past medical and surgical history were reviewed and updated in EPIC.  Current Meds  Medication Sig  . aspirin 81 MG tablet Take 81 mg by mouth daily.    Marland Kitchen atorvastatin (LIPITOR) 20 MG tablet Take 1 tablet (20 mg total) by mouth daily.  . Calcium Carbonate-Vitamin D (CALTRATE 600+D) 600-400 MG-UNIT per tablet Take 1 tablet by mouth daily.    . cholecalciferol (VITAMIN D) 400 UNITS TABS Take 400 Units by mouth daily.    Marland Kitchen diltiazem (TIAZAC) 120 MG 24 hr capsule TAKE 1 CAPSULE (120 MG TOTAL) BY MOUTH DAILY.  Marland Kitchen esomeprazole (NEXIUM) 40 MG capsule Take 40 mg by mouth daily at 12 noon.  . etanercept (ENBREL) 50 MG/ML injection Inject 50 mg into the skin once a week.  Marland Kitchen glipiZIDE (GLUCOTROL) 10 MG tablet Take 10 mg by mouth 2 (two) times daily before a meal.    . metoprolol succinate (TOPROL-XL) 25 MG 24 hr tablet Take 25 mg by mouth daily.    . Multiple Vitamins-Minerals (CENTRUM SILVER ULTRA WOMENS) TABS Take by mouth. 1 tab daily  . nitroGLYCERIN (NITROSTAT) 0.4 MG SL tablet 1 TABLET UNDER TONGUE AT ONSET OF CHEST PAIN YOU MAY REPEAT EVERY  5 MINUTES FOR UP TO 3 DOSES.  Marland Kitchen predniSONE (DELTASONE) 5 MG tablet Take 5 mg by mouth daily.  . sitaGLIPtin-metformin (JANUMET) 50-1000 MG tablet Take 1 tablet by mouth daily.  . Teriparatide, Recombinant, (FORTEO [place]) Inject into the skin daily.  .  [DISCONTINUED] NITROSTAT 0.4 MG SL tablet 1 TABLET UNDER TONGUE AT ONSET OF CHEST PAIN YOU MAY REPEAT EVERY 5 MINUTES FOR UP TO 3 DOSES.    Allergies: Compazine [prochlorperazine edisylate] and Sulfonamide derivatives  Social History   Social History  . Marital status: Married    Spouse name: N/A  . Number of children: N/A  . Years of education: N/A   Occupational History  . Not on file.   Social History Main Topics  . Smoking status: Never Smoker  . Smokeless tobacco: Never Used  . Alcohol use No  . Drug use: No  . Sexual activity: Yes   Other Topics Concern  . Not on file   Social History Narrative  . No narrative on file    Family History  Problem Relation Age of Onset  . Heart failure Father   . Heart attack Father   . Diabetes Father   . Heart attack Mother   . Heart failure Mother   . Diabetes Mother   . Stroke Sister     Review of Systems: A 12-system review of systems was performed and was negative except as noted in the HPI.  --------------------------------------------------------------------------------------------------  Physical Exam: BP 118/70   Pulse (!) 59   Ht 5' 1.5" (1.562 m)   Wt 132 lb 6.4 oz (60.1 kg)   SpO2 97%   BMI 24.61 kg/m   General: Well-developed, well-nourished elderly woman, seated comfortably in the exam room. HEENT: No conjunctival pallor or scleral icterus. Moist mucous membranes.  OP clear. Neck: Supple without lymphadenopathy, thyromegaly, JVD, or HJR.Lungs: Normal work of breathing. Clear to auscultation bilaterally without wheezes or crackles. Heart: Regular rate and rhythm with 2/6 holosystolic murmur loudest at the left lower sternal border.  No rubs or gallops. Non-displaced PMI. Abd: Bowel sounds present. Soft, NT/ND without hepatosplenomegaly Ext: Trace to 1+ pretibial edema.  2+ radial pulses, 1+ bilateral posterior tibial/dorsalis pedis pulses. Skin: Warm and dry with chronic skin changes on both shins.   No  results found for: WBC, HGB, HCT, MCV, PLT  Lab Results  Component Value Date   ALT 13 12/24/2016    Lab Results  Component Value Date   CHOL 129 12/24/2016   HDL 52 12/24/2016   LDLCALC 47 12/24/2016   TRIG 151 (H) 12/24/2016   CHOLHDL 2.5 12/24/2016    --------------------------------------------------------------------------------------------------  ASSESSMENT AND PLAN: Claudication Overall, Ms. Larabee has not had much discomfort with activity.  She notes some pain when standing or walking for extended periods of time, though her underlying arthritis confounds this.  Given that she does not have lifestyle limiting claudication, we have agreed to defer further testing at this time.  We will continue with aggressive medical therapy and secondary prevention, including atorvastatin 20 mg daily and low-dose aspirin.  I asked Ms. Trigg to contact us if she has any worsening leg pain or she developed sores on her feet.  Hyperlipidemia Ms. Vantol is tolerating atorvastatin 20 mg daily well.  LDL last week was excellent at 47.  No medication changes at this time.  Mitral regurgitation No significant symptoms.  Continue follow-up with Dr. Elease Hashimoto.  Stable angina No significant chest pain.  Ms. Mancebo is not needed nitroglycerin  for several months.  Continue her current regimen and follow-up with Dr. Acie Fredrickson, as previously discussed.  Follow-up: Return to clinic in 1 year.  Nelva Bush, MD 12/31/2016 1:18 PM

## 2016-12-31 NOTE — Patient Instructions (Signed)
Medication Instructions:  Your physician recommends that you continue on your current medications as directed. Please refer to the Current Medication list given to you today.   Labwork: None  Testing/Procedures: None   Follow-Up: Your physician wants you to follow-up in: 1 year with Dr End. (October 2019).  You will receive a reminder letter in the mail two months in advance. If you don't receive a letter, please call our office to schedule the follow-up appointment.        If you need a refill on your cardiac medications before your next appointment, please call your pharmacy.   

## 2017-01-11 ENCOUNTER — Other Ambulatory Visit: Payer: Self-pay | Admitting: Cardiovascular Disease

## 2017-02-11 ENCOUNTER — Ambulatory Visit: Payer: Medicare Other | Admitting: Cardiovascular Disease

## 2017-03-22 DIAGNOSIS — M069 Rheumatoid arthritis, unspecified: Secondary | ICD-10-CM | POA: Diagnosis not present

## 2017-03-22 DIAGNOSIS — L409 Psoriasis, unspecified: Secondary | ICD-10-CM | POA: Diagnosis not present

## 2017-03-22 DIAGNOSIS — M2550 Pain in unspecified joint: Secondary | ICD-10-CM | POA: Diagnosis not present

## 2017-03-22 DIAGNOSIS — Z6825 Body mass index (BMI) 25.0-25.9, adult: Secondary | ICD-10-CM | POA: Diagnosis not present

## 2017-03-22 DIAGNOSIS — M150 Primary generalized (osteo)arthritis: Secondary | ICD-10-CM | POA: Diagnosis not present

## 2017-03-22 DIAGNOSIS — E663 Overweight: Secondary | ICD-10-CM | POA: Diagnosis not present

## 2017-03-22 DIAGNOSIS — Z79899 Other long term (current) drug therapy: Secondary | ICD-10-CM | POA: Diagnosis not present

## 2017-03-22 DIAGNOSIS — M810 Age-related osteoporosis without current pathological fracture: Secondary | ICD-10-CM | POA: Diagnosis not present

## 2017-03-27 ENCOUNTER — Other Ambulatory Visit: Payer: Self-pay | Admitting: Internal Medicine

## 2017-03-27 DIAGNOSIS — I739 Peripheral vascular disease, unspecified: Secondary | ICD-10-CM

## 2017-03-27 DIAGNOSIS — E782 Mixed hyperlipidemia: Secondary | ICD-10-CM

## 2017-03-27 NOTE — Telephone Encounter (Signed)
Please review for refill, Thanks !  

## 2017-03-28 DIAGNOSIS — R10819 Abdominal tenderness, unspecified site: Secondary | ICD-10-CM | POA: Diagnosis not present

## 2017-03-28 DIAGNOSIS — K219 Gastro-esophageal reflux disease without esophagitis: Secondary | ICD-10-CM | POA: Diagnosis not present

## 2017-03-28 DIAGNOSIS — Z6823 Body mass index (BMI) 23.0-23.9, adult: Secondary | ICD-10-CM | POA: Diagnosis not present

## 2017-03-28 DIAGNOSIS — R0789 Other chest pain: Secondary | ICD-10-CM | POA: Diagnosis not present

## 2017-03-28 DIAGNOSIS — R079 Chest pain, unspecified: Secondary | ICD-10-CM | POA: Diagnosis not present

## 2017-03-29 DIAGNOSIS — R10819 Abdominal tenderness, unspecified site: Secondary | ICD-10-CM | POA: Diagnosis not present

## 2017-04-01 DIAGNOSIS — R10819 Abdominal tenderness, unspecified site: Secondary | ICD-10-CM | POA: Diagnosis not present

## 2017-04-01 DIAGNOSIS — E1129 Type 2 diabetes mellitus with other diabetic kidney complication: Secondary | ICD-10-CM | POA: Diagnosis not present

## 2017-04-01 DIAGNOSIS — N281 Cyst of kidney, acquired: Secondary | ICD-10-CM | POA: Diagnosis not present

## 2017-04-01 DIAGNOSIS — R109 Unspecified abdominal pain: Secondary | ICD-10-CM | POA: Diagnosis not present

## 2017-04-09 DIAGNOSIS — R05 Cough: Secondary | ICD-10-CM | POA: Diagnosis not present

## 2017-04-09 DIAGNOSIS — J029 Acute pharyngitis, unspecified: Secondary | ICD-10-CM | POA: Diagnosis not present

## 2017-04-12 DIAGNOSIS — J189 Pneumonia, unspecified organism: Secondary | ICD-10-CM | POA: Diagnosis not present

## 2017-04-19 DIAGNOSIS — E1129 Type 2 diabetes mellitus with other diabetic kidney complication: Secondary | ICD-10-CM | POA: Diagnosis not present

## 2017-04-19 DIAGNOSIS — R0602 Shortness of breath: Secondary | ICD-10-CM | POA: Diagnosis not present

## 2017-04-19 DIAGNOSIS — J181 Lobar pneumonia, unspecified organism: Secondary | ICD-10-CM | POA: Diagnosis not present

## 2017-04-19 DIAGNOSIS — R634 Abnormal weight loss: Secondary | ICD-10-CM | POA: Diagnosis not present

## 2017-04-19 DIAGNOSIS — R5381 Other malaise: Secondary | ICD-10-CM | POA: Diagnosis not present

## 2017-04-24 DIAGNOSIS — E559 Vitamin D deficiency, unspecified: Secondary | ICD-10-CM | POA: Diagnosis not present

## 2017-04-24 DIAGNOSIS — I739 Peripheral vascular disease, unspecified: Secondary | ICD-10-CM | POA: Diagnosis not present

## 2017-04-24 DIAGNOSIS — Z1389 Encounter for screening for other disorder: Secondary | ICD-10-CM | POA: Diagnosis not present

## 2017-04-24 DIAGNOSIS — E1129 Type 2 diabetes mellitus with other diabetic kidney complication: Secondary | ICD-10-CM | POA: Diagnosis not present

## 2017-04-24 DIAGNOSIS — Z6822 Body mass index (BMI) 22.0-22.9, adult: Secondary | ICD-10-CM | POA: Diagnosis not present

## 2017-04-24 DIAGNOSIS — R0989 Other specified symptoms and signs involving the circulatory and respiratory systems: Secondary | ICD-10-CM | POA: Diagnosis not present

## 2017-04-24 DIAGNOSIS — I10 Essential (primary) hypertension: Secondary | ICD-10-CM | POA: Diagnosis not present

## 2017-04-24 DIAGNOSIS — D6489 Other specified anemias: Secondary | ICD-10-CM | POA: Diagnosis not present

## 2017-04-24 DIAGNOSIS — N08 Glomerular disorders in diseases classified elsewhere: Secondary | ICD-10-CM | POA: Diagnosis not present

## 2017-04-24 DIAGNOSIS — E7849 Other hyperlipidemia: Secondary | ICD-10-CM | POA: Diagnosis not present

## 2017-04-24 DIAGNOSIS — M069 Rheumatoid arthritis, unspecified: Secondary | ICD-10-CM | POA: Diagnosis not present

## 2017-05-01 ENCOUNTER — Encounter: Payer: Self-pay | Admitting: Cardiovascular Disease

## 2017-05-01 ENCOUNTER — Ambulatory Visit (INDEPENDENT_AMBULATORY_CARE_PROVIDER_SITE_OTHER): Payer: Medicare Other | Admitting: Cardiovascular Disease

## 2017-05-01 VITALS — BP 128/42 | HR 61 | Ht 62.0 in | Wt 128.4 lb

## 2017-05-01 DIAGNOSIS — E782 Mixed hyperlipidemia: Secondary | ICD-10-CM

## 2017-05-01 DIAGNOSIS — I739 Peripheral vascular disease, unspecified: Secondary | ICD-10-CM | POA: Diagnosis not present

## 2017-05-01 NOTE — Progress Notes (Signed)
Cardiology Office Note   Date:  05/01/2017   ID:  Shelby Walker, DOB 09-24-1933, MRN [id number]  PCP:  Reynold Bowen, MD  Cardiologist:  Mertie Moores, MD  ( transfer from Dr. Ron Parker)   Chief Complaint  Patient presents with  . Claudication   follow-up palpitations and some chest discomfort in the past     Shelby Walker is a 84 y.o. female who presents today to follow-up a history of mild chest discomfort and palpitations in the past. She is feeling well and not having any significant symptoms.  Recently she had a mild case of shingles. She also had an upper respiratory infection that is being treated and improving.  Dec 09, 2015:  Shelby Walker is seen today for the first time - transfer from Ron Parker Followed for palpitations with some associated chest pain  Stays active,  Does yard work.   These can last as long as 30 minutes .   No CP or palpitations with yard work . Typically occur at night  The CP starts first and then she has the palpitations  No recent myoview No recent monitor   August 09, 2016:  Shelby Walker is seen for follow up of her carotid disease  She had been having some chest pain . Myoview was low risk.  Still very active,  No further CP .   Has moderate carotid disease Has some burning in her legs with walking and at night   May 01, 2017.  Shelby Walker  is seen today for follow-up of her claudication and episodes of chest pain.3 She is getting over pneumonia .   Leg pain / claudication  has improved.   Has seen Dr. Saunders Revel for PAD .  She was found to have noncritical peripheral arterial disease.  She has not wanted to have any procedure and at this point she has not needed it. Dr. Forde Dandy follows / manages her lipids and diabetes.  Will request labs from recent visit   Past Medical History:  Diagnosis Date  . Arthritis, rheumatoid ([place])   . Bruit    Carotid Doppler showed no significant abnormality     9per patient)  . Chest pain, unspecified    Nuclear, May, 2008, no  scar or ischemia  . Diabetes mellitus   . Dyslipidemia   . Ejection fraction    EF 55-60%, echo, February, 2011  . GERD (gastroesophageal reflux disease)   . Mitral regurgitation April 21, 2009   mild,  echo, February, 2011  . Osteoporosis   . Palpitations    possible very brief atrial fibrillation on monitor and possible reentrant tachycardia  . Psoriasis     History reviewed. No pertinent surgical history.  Patient Active Problem List   Diagnosis Date Noted  . Mixed hyperlipidemia 09/29/2016  . Stable angina ([place]) 09/29/2016  . Claudication in peripheral vascular disease ([place]) 08/09/2016  . Preoperative clearance 05/21/2012  . Precordial chest pain   . Palpitations   . Diabetes mellitus   . GERD (gastroesophageal reflux disease)   . Ejection fraction   . Carotid artery disease ([place])   . Mitral regurgitation 04/21/2009  . PSORIASIS 02/09/2009  . RHEUMATOID ARTHRITIS 02/09/2009      Current Outpatient Medications  Medication Sig Dispense Refill  . albuterol (VENTOLIN HFA) 108 (90 Base) MCG/ACT inhaler Inhale 2 puffs into the lungs every 6 (six) hours as needed for wheezing or shortness of breath.    Marland Kitchen aspirin 81 MG tablet Take 81 mg by  mouth daily.      Marland Kitchen atorvastatin (LIPITOR) 20 MG tablet TAKE 1 TABLET BY MOUTH EVERY DAY 90 tablet 2  . Calcium Carbonate-Vitamin D (CALTRATE 600+D) 600-400 MG-UNIT per tablet Take 1 tablet by mouth daily.      . cholecalciferol (VITAMIN D) 400 UNITS TABS Take 400 Units by mouth daily.      Marland Kitchen diltiazem (TIAZAC) 120 MG 24 hr capsule TAKE 1 CAPSULE (120 MG TOTAL) BY MOUTH DAILY. 90 capsule 1  . esomeprazole (NEXIUM) 40 MG capsule Take 40 mg by mouth daily at 12 noon.    . etanercept (ENBREL) 50 MG/ML injection Inject 50 mg into the skin once a week.    Marland Kitchen glipiZIDE (GLUCOTROL) 10 MG tablet Take 10 mg by mouth 2 (two) times daily before a meal.      . metoprolol succinate (TOPROL-XL) 25 MG 24 hr tablet Take 25 mg by mouth daily.      .  Multiple Vitamins-Minerals (CENTRUM SILVER ULTRA WOMENS) TABS Take by mouth. 1 tab daily    . nitroGLYCERIN (NITROSTAT) 0.4 MG SL tablet 1 TABLET UNDER TONGUE AT ONSET OF CHEST PAIN YOU MAY REPEAT EVERY 5 MINUTES FOR UP TO 3 DOSES. 25 tablet 5  . predniSONE (DELTASONE) 5 MG tablet Take 5 mg by mouth daily.  3  . sitaGLIPtin-metformin (JANUMET) 50-1000 MG tablet Take 1 tablet by mouth daily.    . Teriparatide, Recombinant, (FORTEO [place]) Inject into the skin daily.     No current facility-administered medications for this visit.     Allergies:   Compazine [prochlorperazine edisylate] and Sulfonamide derivatives    Social History:  The patient  reports that  has never smoked. she has never used smokeless tobacco. She reports that she does not drink alcohol or use drugs.   Family History:  The patient's family history includes Diabetes in her father and mother; Heart attack in her father and mother; Heart failure in her father and mother; Stroke in her sister.    ROS: As per current history.  Other systems are negative.  Physical Exam: Blood pressure (!) 128/42, pulse 61, height 5\' 2"  (1.575 m), weight 128 lb 6.4 oz (58.2 kg), SpO2 98 %.  GEN:  Well nourished, well developed in no acute distress HEENT: Normal NECK: No JVD; soft left carotid bruit. LYMPHATICS: No lymphadenopathy CARDIAC: RR RESPIRATORY:  Residual rales right base ( just getting over pneumonia )  ABDOMEN: Soft, non-tender, non-distended MUSCULOSKELETAL:  No edema;  Hands have changes associated with Rheumatoid arthritis   SKIN: Warm and dry NEUROLOGIC:  Alert and oriented x 3    Recent Labs: 12/24/2016: ALT 13    Lipid Panel    Component Value Date/Time   CHOL 129 12/24/2016 1131   TRIG 151 (H) 12/24/2016 1131   HDL 52 12/24/2016 1131   CHOLHDL 2.5 12/24/2016 1131   LDLCALC 47 12/24/2016 1131      Wt Readings from Last 3 Encounters:  05/01/17 128 lb 6.4 oz (58.2 kg)  12/31/16 132 lb 6.4 oz (60.1 kg)    09/28/16 135 lb (61.2 kg)      Current medicines are reviewed  The patient understands her medications well.     ASSESSMENT AND PLAN:  1. Chest pain: She is not having any further episodes of chest pain.  2. Left carotid bruit: -She has mild bilateral carotid artery stenosis.  The plan is to repeat carotid duplex in 1 year.  3. Claudication: Followed by Dr. Saunders Revel.  Continue  with aggressive lipid lowering.     Mertie Moores, MD  05/01/2017 10:03 AM    [address],  [place], [place]  [address] Pager [phone] Phone: [phone]; Fax: [phone]

## 2017-05-01 NOTE — Patient Instructions (Signed)
Medication Instructions:  Your physician recommends that you continue on your current medications as directed. Please refer to the Current Medication list given to you today.   Labwork: None Ordered   Testing/Procedures: None Ordered   Follow-Up: Your physician wants you to follow-up in: 6 months with one of the PAs or Nurse Practitioners on Dr. Elmarie Shiley team.  You will receive a reminder letter in the mail two months in advance. If you don't receive a letter, please call our office to schedule the follow-up appointment.   If you need a refill on your cardiac medications before your next appointment, please call your pharmacy.   Thank you for choosing [facility]! Christen Bame, RN [phone]

## 2017-05-22 DIAGNOSIS — J189 Pneumonia, unspecified organism: Secondary | ICD-10-CM | POA: Diagnosis not present

## 2017-06-09 ENCOUNTER — Other Ambulatory Visit: Payer: Self-pay | Admitting: Cardiovascular Disease

## 2017-07-22 DIAGNOSIS — L409 Psoriasis, unspecified: Secondary | ICD-10-CM | POA: Diagnosis not present

## 2017-07-22 DIAGNOSIS — M069 Rheumatoid arthritis, unspecified: Secondary | ICD-10-CM | POA: Diagnosis not present

## 2017-07-22 DIAGNOSIS — Z6823 Body mass index (BMI) 23.0-23.9, adult: Secondary | ICD-10-CM | POA: Diagnosis not present

## 2017-07-22 DIAGNOSIS — M2550 Pain in unspecified joint: Secondary | ICD-10-CM | POA: Diagnosis not present

## 2017-07-22 DIAGNOSIS — M150 Primary generalized (osteo)arthritis: Secondary | ICD-10-CM | POA: Diagnosis not present

## 2017-07-22 DIAGNOSIS — M810 Age-related osteoporosis without current pathological fracture: Secondary | ICD-10-CM | POA: Diagnosis not present

## 2017-07-22 DIAGNOSIS — Z79899 Other long term (current) drug therapy: Secondary | ICD-10-CM | POA: Diagnosis not present

## 2017-08-05 DIAGNOSIS — Z6823 Body mass index (BMI) 23.0-23.9, adult: Secondary | ICD-10-CM | POA: Diagnosis not present

## 2017-08-05 DIAGNOSIS — E559 Vitamin D deficiency, unspecified: Secondary | ICD-10-CM | POA: Diagnosis not present

## 2017-08-05 DIAGNOSIS — E1140 Type 2 diabetes mellitus with diabetic neuropathy, unspecified: Secondary | ICD-10-CM | POA: Diagnosis not present

## 2017-08-05 DIAGNOSIS — M069 Rheumatoid arthritis, unspecified: Secondary | ICD-10-CM | POA: Diagnosis not present

## 2017-08-05 DIAGNOSIS — M810 Age-related osteoporosis without current pathological fracture: Secondary | ICD-10-CM | POA: Diagnosis not present

## 2017-08-05 DIAGNOSIS — E7849 Other hyperlipidemia: Secondary | ICD-10-CM | POA: Diagnosis not present

## 2017-08-05 DIAGNOSIS — I739 Peripheral vascular disease, unspecified: Secondary | ICD-10-CM | POA: Diagnosis not present

## 2017-08-05 DIAGNOSIS — D6489 Other specified anemias: Secondary | ICD-10-CM | POA: Diagnosis not present

## 2017-08-05 DIAGNOSIS — E1129 Type 2 diabetes mellitus with other diabetic kidney complication: Secondary | ICD-10-CM | POA: Diagnosis not present

## 2017-08-05 DIAGNOSIS — I10 Essential (primary) hypertension: Secondary | ICD-10-CM | POA: Diagnosis not present

## 2017-08-21 DIAGNOSIS — Z6823 Body mass index (BMI) 23.0-23.9, adult: Secondary | ICD-10-CM | POA: Diagnosis not present

## 2017-08-21 DIAGNOSIS — N183 Chronic kidney disease, stage 3 (moderate): Secondary | ICD-10-CM | POA: Diagnosis not present

## 2017-08-21 DIAGNOSIS — I10 Essential (primary) hypertension: Secondary | ICD-10-CM | POA: Diagnosis not present

## 2017-08-21 DIAGNOSIS — E1129 Type 2 diabetes mellitus with other diabetic kidney complication: Secondary | ICD-10-CM | POA: Diagnosis not present

## 2017-09-11 DIAGNOSIS — E119 Type 2 diabetes mellitus without complications: Secondary | ICD-10-CM | POA: Diagnosis not present

## 2017-09-11 DIAGNOSIS — H16223 Keratoconjunctivitis sicca, not specified as Sjogren's, bilateral: Secondary | ICD-10-CM | POA: Diagnosis not present

## 2017-09-11 DIAGNOSIS — H02125 Mechanical ectropion of left lower eyelid: Secondary | ICD-10-CM | POA: Diagnosis not present

## 2017-09-11 DIAGNOSIS — H02122 Mechanical ectropion of right lower eyelid: Secondary | ICD-10-CM | POA: Diagnosis not present

## 2017-09-20 DIAGNOSIS — Z6823 Body mass index (BMI) 23.0-23.9, adult: Secondary | ICD-10-CM | POA: Diagnosis not present

## 2017-09-20 DIAGNOSIS — R21 Rash and other nonspecific skin eruption: Secondary | ICD-10-CM | POA: Diagnosis not present

## 2017-09-25 DIAGNOSIS — N183 Chronic kidney disease, stage 3 (moderate): Secondary | ICD-10-CM | POA: Diagnosis not present

## 2017-09-25 DIAGNOSIS — Z794 Long term (current) use of insulin: Secondary | ICD-10-CM | POA: Diagnosis not present

## 2017-09-25 DIAGNOSIS — I10 Essential (primary) hypertension: Secondary | ICD-10-CM | POA: Diagnosis not present

## 2017-09-25 DIAGNOSIS — E1129 Type 2 diabetes mellitus with other diabetic kidney complication: Secondary | ICD-10-CM | POA: Diagnosis not present

## 2017-10-07 DIAGNOSIS — N183 Chronic kidney disease, stage 3 (moderate): Secondary | ICD-10-CM | POA: Diagnosis not present

## 2017-10-07 DIAGNOSIS — E7849 Other hyperlipidemia: Secondary | ICD-10-CM | POA: Diagnosis not present

## 2017-10-07 DIAGNOSIS — Z794 Long term (current) use of insulin: Secondary | ICD-10-CM | POA: Diagnosis not present

## 2017-10-07 DIAGNOSIS — E1129 Type 2 diabetes mellitus with other diabetic kidney complication: Secondary | ICD-10-CM | POA: Diagnosis not present

## 2017-10-07 DIAGNOSIS — I10 Essential (primary) hypertension: Secondary | ICD-10-CM | POA: Diagnosis not present

## 2017-10-07 DIAGNOSIS — I739 Peripheral vascular disease, unspecified: Secondary | ICD-10-CM | POA: Diagnosis not present

## 2017-10-07 DIAGNOSIS — R0989 Other specified symptoms and signs involving the circulatory and respiratory systems: Secondary | ICD-10-CM | POA: Diagnosis not present

## 2017-10-07 DIAGNOSIS — M810 Age-related osteoporosis without current pathological fracture: Secondary | ICD-10-CM | POA: Diagnosis not present

## 2017-10-07 DIAGNOSIS — E559 Vitamin D deficiency, unspecified: Secondary | ICD-10-CM | POA: Diagnosis not present

## 2017-10-07 DIAGNOSIS — K760 Fatty (change of) liver, not elsewhere classified: Secondary | ICD-10-CM | POA: Diagnosis not present

## 2017-10-07 DIAGNOSIS — M069 Rheumatoid arthritis, unspecified: Secondary | ICD-10-CM | POA: Diagnosis not present

## 2017-10-07 DIAGNOSIS — K219 Gastro-esophageal reflux disease without esophagitis: Secondary | ICD-10-CM | POA: Diagnosis not present

## 2017-10-16 DIAGNOSIS — H16223 Keratoconjunctivitis sicca, not specified as Sjogren's, bilateral: Secondary | ICD-10-CM | POA: Diagnosis not present

## 2017-10-30 DIAGNOSIS — Z794 Long term (current) use of insulin: Secondary | ICD-10-CM | POA: Diagnosis not present

## 2017-10-30 DIAGNOSIS — Z6823 Body mass index (BMI) 23.0-23.9, adult: Secondary | ICD-10-CM | POA: Diagnosis not present

## 2017-10-30 DIAGNOSIS — I10 Essential (primary) hypertension: Secondary | ICD-10-CM | POA: Diagnosis not present

## 2017-10-30 DIAGNOSIS — E1129 Type 2 diabetes mellitus with other diabetic kidney complication: Secondary | ICD-10-CM | POA: Diagnosis not present

## 2017-10-30 DIAGNOSIS — Z23 Encounter for immunization: Secondary | ICD-10-CM | POA: Diagnosis not present

## 2017-10-30 DIAGNOSIS — N183 Chronic kidney disease, stage 3 (moderate): Secondary | ICD-10-CM | POA: Diagnosis not present

## 2017-11-01 ENCOUNTER — Telehealth: Payer: Self-pay | Admitting: Internal Medicine

## 2017-11-01 NOTE — Telephone Encounter (Signed)
° ° ° °  Patient made aware  Dr End will be in [place] only starting Jan 2020

## 2017-11-05 DIAGNOSIS — H02132 Senile ectropion of right lower eyelid: Secondary | ICD-10-CM | POA: Diagnosis not present

## 2017-11-05 DIAGNOSIS — H02055 Trichiasis without entropian left lower eyelid: Secondary | ICD-10-CM | POA: Diagnosis not present

## 2017-11-05 DIAGNOSIS — H02052 Trichiasis without entropian right lower eyelid: Secondary | ICD-10-CM | POA: Diagnosis not present

## 2017-11-16 ENCOUNTER — Emergency Department (HOSPITAL_COMMUNITY)
Admission: EM | Admit: 2017-11-16 | Discharge: 2017-11-16 | Disposition: A | Discharge status: Home / Self Care | Payer: Medicare Other | Attending: Emergency Medicine | Admitting: Emergency Medicine

## 2017-11-16 ENCOUNTER — Encounter (HOSPITAL_COMMUNITY): Payer: Self-pay

## 2017-11-16 ENCOUNTER — Other Ambulatory Visit: Payer: Self-pay

## 2017-11-16 DIAGNOSIS — M069 Rheumatoid arthritis, unspecified: Secondary | ICD-10-CM | POA: Diagnosis not present

## 2017-11-16 DIAGNOSIS — I959 Hypotension, unspecified: Secondary | ICD-10-CM | POA: Diagnosis not present

## 2017-11-16 DIAGNOSIS — Z79899 Other long term (current) drug therapy: Secondary | ICD-10-CM | POA: Insufficient documentation

## 2017-11-16 DIAGNOSIS — R4020 Unspecified coma: Secondary | ICD-10-CM | POA: Diagnosis not present

## 2017-11-16 DIAGNOSIS — E119 Type 2 diabetes mellitus without complications: Secondary | ICD-10-CM | POA: Diagnosis not present

## 2017-11-16 DIAGNOSIS — R55 Syncope and collapse: Secondary | ICD-10-CM | POA: Insufficient documentation

## 2017-11-16 DIAGNOSIS — R42 Dizziness and giddiness: Secondary | ICD-10-CM | POA: Diagnosis not present

## 2017-11-16 LAB — CBG MONITORING, ED: Glucose-Capillary: 125 mg/dL — ABNORMAL HIGH (ref 70–99)

## 2017-11-16 NOTE — ED Notes (Signed)
Bed: WA18 Expected date:  Expected time:  Means of arrival:  Comments: 

## 2017-11-16 NOTE — ED Provider Notes (Signed)
[facility] DEPT Provider Note   CSN: [id number] Arrival date & time: 11/16/17  1230     History   Chief Complaint Chief Complaint  Patient presents with  . Loss of Consciousness    HPI Shelby Walker is a 84 y.o. female.  HPI   She presents for evaluation of syncope which occurred at a funeral, preceded by her feeling hot and dizzy.  She was assisted to ground where she was unconscious for 2 minutes.  She improved prior to arrival of EMS and they transferred her here, during which time they gave her an IV fluid bolus.  Her daughter was with her at the scene and states that "her pulse was weak."  She denies recent fever, chills, nausea, vomiting, cough, shortness of breath, change in bowel or urinary habits.  There are no other known modifying factors.  Past Medical History:  Diagnosis Date  . Arthritis, rheumatoid ([place])   . Bruit    Carotid Doppler showed no significant abnormality     9per patient)  . Chest pain, unspecified    Nuclear, May, 2008, no scar or ischemia  . Diabetes mellitus   . Dyslipidemia   . Ejection fraction    EF 55-60%, echo, February, 2011  . GERD (gastroesophageal reflux disease)   . Mitral regurgitation April 21, 2009   mild,  echo, February, 2011  . Osteoporosis   . Palpitations    possible very brief atrial fibrillation on monitor and possible reentrant tachycardia  . Psoriasis     Patient Active Problem List   Diagnosis Date Noted  . Mixed hyperlipidemia 09/29/2016  . Stable angina ([place]) 09/29/2016  . Claudication in peripheral vascular disease ([place]) 08/09/2016  . Preoperative clearance 05/21/2012  . Precordial chest pain   . Palpitations   . Diabetes mellitus   . GERD (gastroesophageal reflux disease)   . Ejection fraction   . Carotid artery disease ([place])   . Mitral regurgitation 04/21/2009  . PSORIASIS 02/09/2009  . RHEUMATOID ARTHRITIS 02/09/2009    History reviewed. No pertinent surgical  history.   OB History   None      Home Medications    Prior to Admission medications   Medication Sig Start Date End Date Taking? Authorizing Provider  aspirin 81 MG tablet Take 81 mg by mouth daily.     Yes [provider]  atorvastatin (LIPITOR) 20 MG tablet TAKE 1 TABLET BY MOUTH EVERY DAY 03/27/17  Yes End, Harrell Gave, MD  Calcium Carbonate-Vitamin D (CALTRATE 600+D) 600-400 MG-UNIT per tablet Take 1 tablet by mouth daily.     Yes [provider]  Cholecalciferol 1000 units capsule Take 1,000 Units by mouth daily.    Yes [provider]  diltiazem (TIAZAC) 120 MG 24 hr capsule TAKE 1 CAPSULE (120 MG TOTAL) BY MOUTH DAILY. 06/11/17  Yes Nahser, Wonda Cheng, MD  esomeprazole (NEXIUM) 40 MG capsule Take 40 mg by mouth daily at 12 noon.   Yes [provider]  etanercept (ENBREL) 50 MG/ML injection Inject 50 mg into the skin once a week.   Yes [provider]  HUMALOG KWIKPEN 100 UNIT/ML KiwkPen Inject 10 Units into the skin 2 (two) times daily with a meal. 09/04/17  Yes [provider]  Insulin Glargine (BASAGLAR KWIKPEN) 100 UNIT/ML SOPN Inject 20 Units into the skin at bedtime. 10/04/17  Yes [provider]  metFORMIN (GLUCOPHAGE) 500 MG tablet Take 500 mg by mouth 2 (two) times daily. 09/22/17  Yes [provider]  metoprolol succinate (TOPROL-XL) 25 MG 24 hr tablet Take 25 mg by mouth daily.     Yes [provider]  Multiple Vitamins-Minerals (CENTRUM SILVER ULTRA WOMENS) TABS Take by mouth. 1 tab daily   Yes [provider]  nitroGLYCERIN (NITROSTAT) 0.4 MG SL tablet 1 TABLET UNDER TONGUE AT ONSET OF CHEST PAIN YOU MAY REPEAT EVERY 5 MINUTES FOR UP TO 3 DOSES. 12/31/16  Yes End, Harrell Gave, MD  predniSONE (DELTASONE) 5 MG tablet Take 5 mg by mouth daily. 08/31/16  Yes [provider]    Family History Family History  Problem Relation Age of Onset  . Heart failure Father   . Heart attack  Father   . Diabetes Father   . Heart attack Mother   . Heart failure Mother   . Diabetes Mother   . Stroke Sister     Social History Social History   Tobacco Use  . Smoking status: Never Smoker  . Smokeless tobacco: Never Used  Substance Use Topics  . Alcohol use: No    Alcohol/week: 0.0 standard drinks  . Drug use: No     Allergies   Compazine [prochlorperazine edisylate] and Sulfonamide derivatives   Review of Systems Review of Systems  All other systems reviewed and are negative.    Physical Exam Updated Vital Signs BP 140/63   Pulse 70   Temp 97.9 F (36.6 C) (Oral)   Resp 18   Ht 5\' 2"  (1.575 m)   Wt 57.6 kg   SpO2 98%   BMI 23.23 kg/m   Physical Exam  Constitutional: She is oriented to person, place, and time. She appears well-developed and well-nourished.  HENT:  Head: Normocephalic and atraumatic.  Eyes: Pupils are equal, round, and reactive to light. Conjunctivae and EOM are normal.  Neck: Normal range of motion and phonation normal. Neck supple.  Cardiovascular: Normal rate and regular rhythm.  Pulmonary/Chest: Effort normal and breath sounds normal. She exhibits no tenderness.  Abdominal: Soft. She exhibits no distension. There is no tenderness. There is no guarding.  Musculoskeletal: Normal range of motion.  Neurological: She is alert and oriented to person, place, and time. She exhibits normal muscle tone.  No dysarthria, aphasia or nystagmus.  Skin: Skin is warm and dry.  Psychiatric: She has a normal mood and affect. Her behavior is normal. Judgment and thought content normal.  Nursing note and vitals reviewed.    ED Treatments / Results  Labs (all labs ordered are listed, but only abnormal results are displayed) Labs Reviewed  CBG MONITORING, ED - Abnormal; Notable for the following components:      Result Value   Glucose-Capillary 125 (*)    All other components within normal limits    EKG EKG  Interpretation  Date/Time:  Saturday November 16 2017 12:42:20 EDT Ventricular Rate:  69 PR Interval:    QRS Duration: 96 QT Interval:  408 QTC Calculation: 438 R Axis:   34 Text Interpretation:  Sinus rhythm since last tracing no significant change Confirmed by Daleen Bo [phone]) on 11/16/2017 1:11:56 PM   Radiology No results found.  Procedures Procedures (including critical care time)  Medications Ordered in ED Medications - No data to display   Initial Impression / Assessment and Plan / ED Course  I have reviewed the triage vital signs and the nursing notes.  Pertinent labs & imaging results that were available during my care of the patient were reviewed by me and considered in  my medical decision making (see chart for details).  Clinical Course as of Nov 17 1435  Sat Nov 16, 2017  1436 Orthostatic blood pressure pulses were normal.   [EW]    Clinical Course User Index [EW] Daleen Bo, MD     Patient Vitals for the past 24 hrs:  BP Temp Temp src Pulse Resp SpO2 Height Weight  11/16/17 1434 140/63 - - 70 18 98 % - -  11/16/17 1430 140/63 - - 70 - 98 % - -  11/16/17 1400 (!) 140/57 - - 70 (!) 23 100 % - -  11/16/17 1330 (!) 139/59 - - 69 16 99 % - -  11/16/17 1300 (!) 158/56 - - 70 (!) 22 100 % - -  11/16/17 1243 (!) 130/53 97.9 F (36.6 C) Oral 69 16 99 % - -  11/16/17 1240 - - - - - - 5\' 2"  (1.575 m) 57.6 kg    2:36 PM Reevaluation with update and discussion. After initial assessment and treatment, an updated evaluation reveals patient states that she feels better at this time.  She has been able to tolerate oral liquids.  Findings discussed with patient and family members, all questions were answered. Daleen Bo   Medical Decision Making: Syncope likely vasovagal, without injury or residual symptoms.  Doubt serious bacterial infection, metabolic instability or impending vascular collapse.  CRITICAL CARE-no Performed by: Daleen Bo   Nursing  Notes Reviewed/ Care Coordinated Applicable Imaging Reviewed Interpretation of Laboratory Data incorporated into ED treatment  The patient appears reasonably screened and/or stabilized for discharge and I doubt any other medical condition or other [facility] requiring further screening, evaluation, or treatment in the ED at this time prior to discharge.  Plan: Home Medications-continue usual medications; Home Treatments-rest, fluids; return here if the recommended treatment, does not improve the symptoms; Recommended follow up-PCP, PRN     Final Clinical Impressions(s) / ED Diagnoses   Final diagnoses:  Syncope, unspecified syncope type    ED Discharge Orders    None       Daleen Bo, MD 11/16/17 1437

## 2017-11-16 NOTE — ED Triage Notes (Signed)
EMS reports at outdoor funeral and became hot, dizzy and lightheaded, had syncopal episode, no fall helped to ground, no obvious injuries, denies pain.  BP 118/62 HR 70 RR 18 Sp02 95 RA CBG 155  20ga RAC 645ml NS enroute 4mg  Zofran

## 2017-11-16 NOTE — Discharge Instructions (Addendum)
There were no serious causes found after your episode of fainting today.  In the future, if you feel dizzy, like down as soon as possible.  Continue taking her usual medications.  Follow-up with your doctor as needed for problems.

## 2017-11-16 NOTE — ED Notes (Signed)
Bed: WA20 Expected date:  Expected time:  Means of arrival:  Comments: EMS- syncope

## 2017-11-21 ENCOUNTER — Other Ambulatory Visit: Payer: Self-pay | Admitting: *Deleted

## 2017-11-21 DIAGNOSIS — M150 Primary generalized (osteo)arthritis: Secondary | ICD-10-CM | POA: Diagnosis not present

## 2017-11-21 DIAGNOSIS — M810 Age-related osteoporosis without current pathological fracture: Secondary | ICD-10-CM | POA: Diagnosis not present

## 2017-11-21 DIAGNOSIS — L409 Psoriasis, unspecified: Secondary | ICD-10-CM | POA: Diagnosis not present

## 2017-11-21 DIAGNOSIS — Z6824 Body mass index (BMI) 24.0-24.9, adult: Secondary | ICD-10-CM | POA: Diagnosis not present

## 2017-11-21 DIAGNOSIS — M069 Rheumatoid arthritis, unspecified: Secondary | ICD-10-CM | POA: Diagnosis not present

## 2017-11-21 DIAGNOSIS — I739 Peripheral vascular disease, unspecified: Principal | ICD-10-CM

## 2017-11-21 DIAGNOSIS — Z79899 Other long term (current) drug therapy: Secondary | ICD-10-CM | POA: Diagnosis not present

## 2017-11-21 DIAGNOSIS — I779 Disorder of arteries and arterioles, unspecified: Secondary | ICD-10-CM

## 2017-11-21 DIAGNOSIS — M2550 Pain in unspecified joint: Secondary | ICD-10-CM | POA: Diagnosis not present

## 2017-11-25 ENCOUNTER — Encounter: Payer: Self-pay | Admitting: *Deleted

## 2017-11-25 NOTE — Telephone Encounter (Signed)
This encounter was created in error - please disregard.

## 2017-11-27 DIAGNOSIS — H5789 Other specified disorders of eye and adnexa: Secondary | ICD-10-CM | POA: Diagnosis not present

## 2017-11-27 DIAGNOSIS — H02132 Senile ectropion of right lower eyelid: Secondary | ICD-10-CM | POA: Diagnosis not present

## 2017-11-27 DIAGNOSIS — H02055 Trichiasis without entropian left lower eyelid: Secondary | ICD-10-CM | POA: Diagnosis not present

## 2017-11-27 DIAGNOSIS — H02135 Senile ectropion of left lower eyelid: Secondary | ICD-10-CM | POA: Diagnosis not present

## 2017-11-27 DIAGNOSIS — H02052 Trichiasis without entropian right lower eyelid: Secondary | ICD-10-CM | POA: Diagnosis not present

## 2017-11-27 DIAGNOSIS — Z01818 Encounter for other preprocedural examination: Secondary | ICD-10-CM | POA: Diagnosis not present

## 2017-12-02 ENCOUNTER — Telehealth: Payer: Self-pay | Admitting: Nurse Practitioner

## 2017-12-02 NOTE — Telephone Encounter (Signed)
I will be glad to see her for follow up ( including seeing her in conjunction with Richardson Dopp, PA )

## 2017-12-02 NOTE — Telephone Encounter (Signed)
Called patient in regards to a letter received from [facility] regarding patient taking Metoprolol and Diltiazem. I reviewed patient's chart and saw that she had been in the ED with syncope so I called to check on her. She states that the day the syncope occurred, she was standing up outside for 1.5 hours on a very hot day at a funeral. She denies any additional syncope or pre-syncope events, symptoms or concerns. She is aware of her Nov. 6 appointment with Richardson Dopp, PA. She sees Dr. Acie Fredrickson for general cardiology and saw Dr. Saunders Revel for PV but does not want to transfer to [facility]. I advised her to call prior to appointment with questions or concerns and she was grateful for my call.

## 2017-12-03 ENCOUNTER — Telehealth: Payer: Self-pay | Admitting: Cardiovascular Disease

## 2017-12-03 NOTE — Telephone Encounter (Signed)
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 She went to the emergency room and was told it was most likely from dehydration and being really hot.  Since then, she feels like her heart races every now and then. She has no idea what her heart rate is. Today, she "doesn't feel great." She is a little shaky and her sugar is 86 (which she says is low for her). She had a snack and is already starting to feel better. She does not report other symptoms and denies dizziness, SOB, CP. She has no vital signs to report. Scheduled patient for overdue visit with Dr. Acie Fredrickson tomorrow. She was grateful for call and agrees with treatment plan.

## 2017-12-03 NOTE — Telephone Encounter (Signed)
° °  Pt c/o Syncope: STAT if syncope occurred within 30 minutes and pt complains of lightheadedness High Priority if episode of passing out, completely, today or in last 24 hours   1. Did you pass out today? No   2. When is the last time you passed out? 11/17/2017  3. Has this occurred multiple times? No   4. Did you have any symptoms prior to passing out? Dizziness, rapid heartbeat

## 2017-12-04 ENCOUNTER — Encounter: Payer: Self-pay | Admitting: Cardiovascular Disease

## 2017-12-04 ENCOUNTER — Ambulatory Visit (INDEPENDENT_AMBULATORY_CARE_PROVIDER_SITE_OTHER): Payer: Medicare Other | Admitting: Cardiovascular Disease

## 2017-12-04 VITALS — BP 122/56 | HR 76 | Ht 62.0 in | Wt 129.0 lb

## 2017-12-04 DIAGNOSIS — I6523 Occlusion and stenosis of bilateral carotid arteries: Secondary | ICD-10-CM | POA: Diagnosis not present

## 2017-12-04 NOTE — Progress Notes (Signed)
Cardiology Office Note   Date:  12/04/2017   ID:  [name], DOB 11/26/1933, MRN [id number]  PCP:  Reynold Bowen, MD  Cardiologist:  Mertie Moores, MD  ( transfer from Dr. Ron Parker)   Chief Complaint  Patient presents with  . Claudication   follow-up palpitations and some chest discomfort in the past     Shelby Walker is a 84 y.o. female who presents today to follow-up a history of mild chest discomfort and palpitations in the past. She is feeling well and not having any significant symptoms.  Recently she had a mild case of shingles. She also had an upper respiratory infection that is being treated and improving.  Dec 09, 2015:  Shelby Walker is seen today for the first time - transfer from Ron Parker Followed for palpitations with some associated chest pain  Stays active,  Does yard work.   These can last as long as 30 minutes .   No CP or palpitations with yard work . Typically occur at night  The CP starts first and then she has the palpitations  No recent myoview No recent monitor   August 09, 2016:  Shelby Walker is seen for follow up of her carotid disease  She had been having some chest pain . Myoview was low risk.  Still very active,  No further CP .   Has moderate carotid disease Has some burning in her legs with walking and at night   May 01, 2017.  Shelby Walker  is seen today for follow-up of her claudication and episodes of chest pain.3 She is getting over pneumonia .   Leg pain / claudication  has improved.   Has seen Dr. Saunders Revel for PAD .  She was found to have noncritical peripheral arterial disease.  She has not wanted to have any procedure and at this point she has not needed it. Dr. Forde Dandy follows / manages her lipids and diabetes.  Will request labs from recent visit   Oct. 2, 2019:  Shelby Walker is seen today .  Seen with daughter , Shelby Walker  Had an episode of syncope several weeks ago ( was very hot , standing for 45 min at a funeral )  Had a mild episode of lightheadeeness  yesterday  Admits that she does eat well or drink enough Associated with a fast HR ( felt that her heart was pounding )  Has mild carotid disease. Scheduled to have repeat carotid duplex later this month  Past Medical History:  Diagnosis Date  . Arthritis, rheumatoid ([place])   . Bruit    Carotid Doppler showed no significant abnormality     9per patient)  . Chest pain, unspecified    Nuclear, May, 2008, no scar or ischemia  . Diabetes mellitus   . Dyslipidemia   . Ejection fraction    EF 55-60%, echo, February, 2011  . GERD (gastroesophageal reflux disease)   . Mitral regurgitation April 21, 2009   mild,  echo, February, 2011  . Osteoporosis   . Palpitations    possible very brief atrial fibrillation on monitor and possible reentrant tachycardia  . Psoriasis     History reviewed. No pertinent surgical history.  Patient Active Problem List   Diagnosis Date Noted  . Mixed hyperlipidemia 09/29/2016  . Stable angina ([place]) 09/29/2016  . Claudication in peripheral vascular disease ([place]) 08/09/2016  . Preoperative clearance 05/21/2012  . Precordial chest pain   . Palpitations   . Diabetes mellitus   . GERD (  gastroesophageal reflux disease)   . Ejection fraction   . Carotid artery disease ([place])   . Mitral regurgitation 04/21/2009  . PSORIASIS 02/09/2009  . RHEUMATOID ARTHRITIS 02/09/2009      Current Outpatient Medications  Medication Sig Dispense Refill  . aspirin 81 MG tablet Take 81 mg by mouth daily as needed for pain.     Marland Kitchen atorvastatin (LIPITOR) 20 MG tablet TAKE 1 TABLET BY MOUTH EVERY DAY 90 tablet 2  . Calcium Carbonate-Vitamin D (CALTRATE 600+D) 600-400 MG-UNIT per tablet Take 1 tablet by mouth daily.      . Cholecalciferol 1000 units capsule Take 1,000 Units by mouth daily.     Marland Kitchen diltiazem (TIAZAC) 120 MG 24 hr capsule TAKE 1 CAPSULE (120 MG TOTAL) BY MOUTH DAILY. 90 capsule 1  . esomeprazole (NEXIUM) 40 MG capsule Take 40 mg by mouth daily at 12 noon.    .  etanercept (ENBREL) 50 MG/ML injection Inject 50 mg into the skin once a week.    Marland Kitchen HUMALOG KWIKPEN 100 UNIT/ML KiwkPen Inject 10 Units into the skin 2 (two) times daily with a meal.  6  . Insulin Glargine (BASAGLAR KWIKPEN) 100 UNIT/ML SOPN Inject 20 Units into the skin at bedtime.  5  . metFORMIN (GLUCOPHAGE) 500 MG tablet Take 500 mg by mouth daily with breakfast.   6  . metoprolol succinate (TOPROL-XL) 25 MG 24 hr tablet Take 25 mg by mouth daily.      . Multiple Vitamins-Minerals (CENTRUM SILVER ULTRA WOMENS) TABS Take by mouth. 1 tab daily    . nitroGLYCERIN (NITROSTAT) 0.4 MG SL tablet 1 TABLET UNDER TONGUE AT ONSET OF CHEST PAIN YOU MAY REPEAT EVERY 5 MINUTES FOR UP TO 3 DOSES. 25 tablet 5  . predniSONE (DELTASONE) 5 MG tablet Take 5 mg by mouth daily.  3   No current facility-administered medications for this visit.     Allergies:   Compazine [prochlorperazine edisylate] and Sulfonamide derivatives    Social History:  The patient  reports that she has never smoked. She has never used smokeless tobacco. She reports that she does not drink alcohol or use drugs.   Family History:  The patient's family history includes Diabetes in her father and mother; Heart attack in her father and mother; Heart failure in her father and mother; Stroke in her sister.    ROS: As per current history.  Other systems are negative.  Physical Exam: Blood pressure (!) 122/56, pulse 76, height 5\' 2"  (1.575 m), weight 129 lb (58.5 kg), SpO2 97 %.  GEN:  lederly female  HEENT: Normal NECK: No JVD; No carotid bruits LYMPHATICS: No lymphadenopathy CARDIAC: RRR RESPIRATORY:  Clear to auscultation without rales, wheezing or rhonchi  ABDOMEN: Soft, non-tender, non-distended MUSCULOSKELETAL:  No edema; No deformity  SKIN: Warm and dry NEUROLOGIC:  Alert and oriented x 3   EKG: December 04, 2017: Normal sinus rhythm at 76.  Normal EKG.  Recent Labs: 12/24/2016: ALT 13    Lipid Panel    Component  Value Date/Time   CHOL 129 12/24/2016 1131   TRIG 151 (H) 12/24/2016 1131   HDL 52 12/24/2016 1131   CHOLHDL 2.5 12/24/2016 1131   LDLCALC 47 12/24/2016 1131      Wt Readings from Last 3 Encounters:  12/04/17 129 lb (58.5 kg)  11/16/17 127 lb (57.6 kg)  05/01/17 128 lb 6.4 oz (58.2 kg)      Current medicines are reviewed  The patient understands her medications well.  ASSESSMENT AND PLAN:  1. Chest pain:   2. Left carotid bruit: -    Has mild carotid disease but duplex scan October, 2018:  Scheduled for repeat duplex scan later this months .   3. Claudication:   She previously has been seen by Dr. Saunders Revel.  Dr. and has moved his practice to [place].  She does not want to travel to [place].  We will continue to follow her and will have her see Dr. Fletcher Anon at the [facility] office if she needs further peripheral vascular procedures/evaluation.    Mertie Moores, MD  12/04/2017 12:00 PM    [address],  [place], [place]  [address] Pager [phone] Phone: [phone]; Fax: [phone]

## 2017-12-04 NOTE — Patient Instructions (Addendum)
Medication Instructions:  Your physician has recommended you make the following change in your medication:   STOP Diltiazem (Tiazec)   Labwork: None Ordered   Testing/Procedures: None Ordered   Follow-Up: Your physician wants you to follow-up in: 6 months with Dr. Acie Fredrickson. You will receive a reminder letter in the mail two months in advance. If you don't receive a letter, please call our office to schedule the follow-up appointment.   If you need a refill on your cardiac medications before your next appointment, please call your pharmacy.   Thank you for choosing [facility]! Christen Bame, RN [phone]

## 2017-12-15 ENCOUNTER — Other Ambulatory Visit: Payer: Self-pay | Admitting: Cardiovascular Disease

## 2017-12-25 ENCOUNTER — Other Ambulatory Visit: Payer: Self-pay | Admitting: Internal Medicine

## 2017-12-25 DIAGNOSIS — I739 Peripheral vascular disease, unspecified: Secondary | ICD-10-CM

## 2017-12-25 DIAGNOSIS — E782 Mixed hyperlipidemia: Secondary | ICD-10-CM

## 2018-01-01 ENCOUNTER — Other Ambulatory Visit: Payer: Self-pay | Admitting: Internal Medicine

## 2018-01-01 ENCOUNTER — Ambulatory Visit (HOSPITAL_COMMUNITY)
Admission: RE | Admit: 2018-01-01 | Discharge: 2018-01-01 | Disposition: A | Discharge status: Home / Self Care | Payer: Medicare Other | Source: Ambulatory Visit | Attending: Cardiology | Admitting: Cardiology

## 2018-01-01 DIAGNOSIS — I6529 Occlusion and stenosis of unspecified carotid artery: Secondary | ICD-10-CM | POA: Diagnosis not present

## 2018-01-03 ENCOUNTER — Telehealth: Payer: Self-pay | Admitting: Nurse Practitioner

## 2018-01-03 DIAGNOSIS — I6523 Occlusion and stenosis of bilateral carotid arteries: Secondary | ICD-10-CM

## 2018-01-03 NOTE — Telephone Encounter (Signed)
-----   Message from Thayer Headings, MD sent at 01/02/2018  5:34 PM EDT ----- Mild Carotid disease LDL looked good  Continue meds Repeat carotid duplex in 1 year

## 2018-01-03 NOTE — Telephone Encounter (Signed)
Results reviewed with patient who verbalized understanding. Order in for repeat carotid u/s in one year.

## 2018-01-06 ENCOUNTER — Ambulatory Visit: Payer: Medicare Other | Admitting: Internal Medicine

## 2018-01-08 ENCOUNTER — Ambulatory Visit: Payer: Medicare Other | Admitting: Physician Assistant

## 2018-01-20 DIAGNOSIS — R82998 Other abnormal findings in urine: Secondary | ICD-10-CM | POA: Diagnosis not present

## 2018-01-20 DIAGNOSIS — E559 Vitamin D deficiency, unspecified: Secondary | ICD-10-CM | POA: Diagnosis not present

## 2018-01-20 DIAGNOSIS — E1129 Type 2 diabetes mellitus with other diabetic kidney complication: Secondary | ICD-10-CM | POA: Diagnosis not present

## 2018-01-20 DIAGNOSIS — E7849 Other hyperlipidemia: Secondary | ICD-10-CM | POA: Diagnosis not present

## 2018-01-20 DIAGNOSIS — I10 Essential (primary) hypertension: Secondary | ICD-10-CM | POA: Diagnosis not present

## 2018-01-25 ENCOUNTER — Other Ambulatory Visit: Payer: Self-pay | Admitting: Internal Medicine

## 2018-01-25 DIAGNOSIS — I739 Peripheral vascular disease, unspecified: Secondary | ICD-10-CM

## 2018-01-25 DIAGNOSIS — E782 Mixed hyperlipidemia: Secondary | ICD-10-CM

## 2018-01-27 DIAGNOSIS — I739 Peripheral vascular disease, unspecified: Secondary | ICD-10-CM | POA: Diagnosis not present

## 2018-01-27 DIAGNOSIS — N08 Glomerular disorders in diseases classified elsewhere: Secondary | ICD-10-CM | POA: Diagnosis not present

## 2018-01-27 DIAGNOSIS — L989 Disorder of the skin and subcutaneous tissue, unspecified: Secondary | ICD-10-CM | POA: Diagnosis not present

## 2018-01-27 DIAGNOSIS — M5416 Radiculopathy, lumbar region: Secondary | ICD-10-CM | POA: Diagnosis not present

## 2018-01-27 DIAGNOSIS — R42 Dizziness and giddiness: Secondary | ICD-10-CM | POA: Diagnosis not present

## 2018-01-27 DIAGNOSIS — R002 Palpitations: Secondary | ICD-10-CM | POA: Diagnosis not present

## 2018-01-27 DIAGNOSIS — R55 Syncope and collapse: Secondary | ICD-10-CM | POA: Diagnosis not present

## 2018-01-27 DIAGNOSIS — Z6826 Body mass index (BMI) 26.0-26.9, adult: Secondary | ICD-10-CM | POA: Diagnosis not present

## 2018-01-27 DIAGNOSIS — N183 Chronic kidney disease, stage 3 (moderate): Secondary | ICD-10-CM | POA: Diagnosis not present

## 2018-01-27 DIAGNOSIS — L84 Corns and callosities: Secondary | ICD-10-CM | POA: Diagnosis not present

## 2018-01-27 DIAGNOSIS — Z0000 Encounter for general adult medical examination without abnormal findings: Secondary | ICD-10-CM | POA: Diagnosis not present

## 2018-01-27 DIAGNOSIS — Z794 Long term (current) use of insulin: Secondary | ICD-10-CM | POA: Diagnosis not present

## 2018-01-27 NOTE — Telephone Encounter (Signed)
Please review for refill.  

## 2018-03-06 DIAGNOSIS — Z79899 Other long term (current) drug therapy: Secondary | ICD-10-CM | POA: Diagnosis not present

## 2018-03-06 DIAGNOSIS — M069 Rheumatoid arthritis, unspecified: Secondary | ICD-10-CM | POA: Diagnosis not present

## 2018-03-06 DIAGNOSIS — I10 Essential (primary) hypertension: Secondary | ICD-10-CM | POA: Diagnosis not present

## 2018-03-06 DIAGNOSIS — N183 Chronic kidney disease, stage 3 (moderate): Secondary | ICD-10-CM | POA: Diagnosis not present

## 2018-03-06 DIAGNOSIS — Z6826 Body mass index (BMI) 26.0-26.9, adult: Secondary | ICD-10-CM | POA: Diagnosis not present

## 2018-03-06 DIAGNOSIS — Z794 Long term (current) use of insulin: Secondary | ICD-10-CM | POA: Diagnosis not present

## 2018-03-06 DIAGNOSIS — E1129 Type 2 diabetes mellitus with other diabetic kidney complication: Secondary | ICD-10-CM | POA: Diagnosis not present

## 2018-04-09 DIAGNOSIS — Z85828 Personal history of other malignant neoplasm of skin: Secondary | ICD-10-CM | POA: Diagnosis not present

## 2018-04-09 DIAGNOSIS — D485 Neoplasm of uncertain behavior of skin: Secondary | ICD-10-CM | POA: Diagnosis not present

## 2018-04-16 DIAGNOSIS — H02122 Mechanical ectropion of right lower eyelid: Secondary | ICD-10-CM | POA: Diagnosis not present

## 2018-04-16 DIAGNOSIS — E663 Overweight: Secondary | ICD-10-CM | POA: Diagnosis not present

## 2018-04-16 DIAGNOSIS — M2550 Pain in unspecified joint: Secondary | ICD-10-CM | POA: Diagnosis not present

## 2018-04-16 DIAGNOSIS — Z79899 Other long term (current) drug therapy: Secondary | ICD-10-CM | POA: Diagnosis not present

## 2018-04-16 DIAGNOSIS — H353131 Nonexudative age-related macular degeneration, bilateral, early dry stage: Secondary | ICD-10-CM | POA: Diagnosis not present

## 2018-04-16 DIAGNOSIS — H02125 Mechanical ectropion of left lower eyelid: Secondary | ICD-10-CM | POA: Diagnosis not present

## 2018-04-16 DIAGNOSIS — M069 Rheumatoid arthritis, unspecified: Secondary | ICD-10-CM | POA: Diagnosis not present

## 2018-04-16 DIAGNOSIS — M150 Primary generalized (osteo)arthritis: Secondary | ICD-10-CM | POA: Diagnosis not present

## 2018-04-16 DIAGNOSIS — L409 Psoriasis, unspecified: Secondary | ICD-10-CM | POA: Diagnosis not present

## 2018-04-16 DIAGNOSIS — Z6826 Body mass index (BMI) 26.0-26.9, adult: Secondary | ICD-10-CM | POA: Diagnosis not present

## 2018-04-16 DIAGNOSIS — H16223 Keratoconjunctivitis sicca, not specified as Sjogren's, bilateral: Secondary | ICD-10-CM | POA: Diagnosis not present

## 2018-04-16 DIAGNOSIS — M810 Age-related osteoporosis without current pathological fracture: Secondary | ICD-10-CM | POA: Diagnosis not present

## 2018-04-21 ENCOUNTER — Other Ambulatory Visit: Payer: Self-pay | Admitting: Cardiovascular Disease

## 2018-04-28 DIAGNOSIS — I10 Essential (primary) hypertension: Secondary | ICD-10-CM | POA: Diagnosis not present

## 2018-04-28 DIAGNOSIS — E119 Type 2 diabetes mellitus without complications: Secondary | ICD-10-CM | POA: Diagnosis not present

## 2018-04-28 DIAGNOSIS — Z6826 Body mass index (BMI) 26.0-26.9, adult: Secondary | ICD-10-CM | POA: Diagnosis not present

## 2018-04-28 DIAGNOSIS — M25552 Pain in left hip: Secondary | ICD-10-CM | POA: Diagnosis not present

## 2018-04-28 DIAGNOSIS — Z794 Long term (current) use of insulin: Secondary | ICD-10-CM | POA: Diagnosis not present

## 2018-04-28 DIAGNOSIS — M7060 Trochanteric bursitis, unspecified hip: Secondary | ICD-10-CM | POA: Diagnosis not present

## 2018-05-14 DIAGNOSIS — L989 Disorder of the skin and subcutaneous tissue, unspecified: Secondary | ICD-10-CM | POA: Diagnosis not present

## 2018-05-15 DIAGNOSIS — R05 Cough: Secondary | ICD-10-CM | POA: Diagnosis not present

## 2018-05-19 ENCOUNTER — Other Ambulatory Visit: Payer: Self-pay | Admitting: Otolaryngology

## 2018-05-19 DIAGNOSIS — R5381 Other malaise: Secondary | ICD-10-CM

## 2018-05-28 ENCOUNTER — Other Ambulatory Visit: Payer: Self-pay | Admitting: Otolaryngology

## 2018-05-28 DIAGNOSIS — L989 Disorder of the skin and subcutaneous tissue, unspecified: Secondary | ICD-10-CM

## 2018-06-04 DIAGNOSIS — E1129 Type 2 diabetes mellitus with other diabetic kidney complication: Secondary | ICD-10-CM | POA: Diagnosis not present

## 2018-06-05 DIAGNOSIS — K760 Fatty (change of) liver, not elsewhere classified: Secondary | ICD-10-CM | POA: Diagnosis not present

## 2018-06-05 DIAGNOSIS — N183 Chronic kidney disease, stage 3 (moderate): Secondary | ICD-10-CM | POA: Diagnosis not present

## 2018-06-05 DIAGNOSIS — K219 Gastro-esophageal reflux disease without esophagitis: Secondary | ICD-10-CM | POA: Diagnosis not present

## 2018-06-05 DIAGNOSIS — I129 Hypertensive chronic kidney disease with stage 1 through stage 4 chronic kidney disease, or unspecified chronic kidney disease: Secondary | ICD-10-CM | POA: Diagnosis not present

## 2018-06-05 DIAGNOSIS — E785 Hyperlipidemia, unspecified: Secondary | ICD-10-CM | POA: Diagnosis not present

## 2018-06-05 DIAGNOSIS — E1129 Type 2 diabetes mellitus with other diabetic kidney complication: Secondary | ICD-10-CM | POA: Diagnosis not present

## 2018-06-05 DIAGNOSIS — M810 Age-related osteoporosis without current pathological fracture: Secondary | ICD-10-CM | POA: Diagnosis not present

## 2018-06-05 DIAGNOSIS — R0989 Other specified symptoms and signs involving the circulatory and respiratory systems: Secondary | ICD-10-CM | POA: Diagnosis not present

## 2018-06-05 DIAGNOSIS — M069 Rheumatoid arthritis, unspecified: Secondary | ICD-10-CM | POA: Diagnosis not present

## 2018-06-05 DIAGNOSIS — I739 Peripheral vascular disease, unspecified: Secondary | ICD-10-CM | POA: Diagnosis not present

## 2018-06-05 DIAGNOSIS — E559 Vitamin D deficiency, unspecified: Secondary | ICD-10-CM | POA: Diagnosis not present

## 2018-06-16 ENCOUNTER — Other Ambulatory Visit: Payer: Self-pay

## 2018-06-16 ENCOUNTER — Ambulatory Visit
Admission: RE | Admit: 2018-06-16 | Discharge: 2018-06-16 | Disposition: A | Discharge status: Home / Self Care | Payer: Medicare Other | Source: Ambulatory Visit | Attending: Otolaryngology | Admitting: Otolaryngology

## 2018-06-16 DIAGNOSIS — L989 Disorder of the skin and subcutaneous tissue, unspecified: Secondary | ICD-10-CM

## 2018-06-16 DIAGNOSIS — R221 Localized swelling, mass and lump, neck: Secondary | ICD-10-CM | POA: Diagnosis not present

## 2018-06-16 MED ORDER — IOPAMIDOL (ISOVUE-300) INJECTION 61%
75.0000 mL | Freq: Once | INTRAVENOUS | Status: AC | PRN
  Administered 2018-06-16: 75 mL via INTRAVENOUS

## 2018-07-04 DIAGNOSIS — A0472 Enterocolitis due to Clostridium difficile, not specified as recurrent: Secondary | ICD-10-CM

## 2018-07-04 HISTORY — DX: Enterocolitis due to Clostridium difficile, not specified as recurrent: A04.72

## 2018-07-08 DIAGNOSIS — M069 Rheumatoid arthritis, unspecified: Secondary | ICD-10-CM | POA: Diagnosis not present

## 2018-07-08 DIAGNOSIS — E1129 Type 2 diabetes mellitus with other diabetic kidney complication: Secondary | ICD-10-CM | POA: Diagnosis not present

## 2018-07-08 DIAGNOSIS — R197 Diarrhea, unspecified: Secondary | ICD-10-CM | POA: Diagnosis not present

## 2018-07-08 DIAGNOSIS — N183 Chronic kidney disease, stage 3 (moderate): Secondary | ICD-10-CM | POA: Diagnosis not present

## 2018-07-08 DIAGNOSIS — R140 Abdominal distension (gaseous): Secondary | ICD-10-CM | POA: Diagnosis not present

## 2018-07-08 DIAGNOSIS — R110 Nausea: Secondary | ICD-10-CM | POA: Diagnosis not present

## 2018-07-08 DIAGNOSIS — I129 Hypertensive chronic kidney disease with stage 1 through stage 4 chronic kidney disease, or unspecified chronic kidney disease: Secondary | ICD-10-CM | POA: Diagnosis not present

## 2018-07-09 ENCOUNTER — Encounter (HOSPITAL_BASED_OUTPATIENT_CLINIC_OR_DEPARTMENT_OTHER): Payer: Self-pay | Admitting: Emergency Medicine

## 2018-07-09 ENCOUNTER — Emergency Department (HOSPITAL_BASED_OUTPATIENT_CLINIC_OR_DEPARTMENT_OTHER): Payer: Medicare Other

## 2018-07-09 ENCOUNTER — Emergency Department (HOSPITAL_BASED_OUTPATIENT_CLINIC_OR_DEPARTMENT_OTHER)
Admission: EM | Admit: 2018-07-09 | Discharge: 2018-07-09 | Disposition: A | Discharge status: Home / Self Care | Payer: Medicare Other | Attending: Emergency Medicine | Admitting: Emergency Medicine

## 2018-07-09 ENCOUNTER — Other Ambulatory Visit: Payer: Self-pay

## 2018-07-09 DIAGNOSIS — Z79899 Other long term (current) drug therapy: Secondary | ICD-10-CM | POA: Insufficient documentation

## 2018-07-09 DIAGNOSIS — E119 Type 2 diabetes mellitus without complications: Secondary | ICD-10-CM | POA: Diagnosis not present

## 2018-07-09 DIAGNOSIS — Z794 Long term (current) use of insulin: Secondary | ICD-10-CM | POA: Insufficient documentation

## 2018-07-09 DIAGNOSIS — K5900 Constipation, unspecified: Secondary | ICD-10-CM

## 2018-07-09 DIAGNOSIS — K5730 Diverticulosis of large intestine without perforation or abscess without bleeding: Secondary | ICD-10-CM | POA: Diagnosis not present

## 2018-07-09 DIAGNOSIS — R1030 Lower abdominal pain, unspecified: Secondary | ICD-10-CM | POA: Diagnosis not present

## 2018-07-09 DIAGNOSIS — K5732 Diverticulitis of large intestine without perforation or abscess without bleeding: Secondary | ICD-10-CM | POA: Insufficient documentation

## 2018-07-09 HISTORY — DX: Rheumatoid arthritis, unspecified: M06.9

## 2018-07-09 LAB — URINALYSIS, MICROSCOPIC (REFLEX)

## 2018-07-09 LAB — CBC WITH DIFFERENTIAL/PLATELET
Abs Immature Granulocytes: 0.04 10*3/uL (ref 0.00–0.07)
Basophils Absolute: 0.1 10*3/uL (ref 0.0–0.1)
Basophils Relative: 1 %
Eosinophils Absolute: 0.3 10*3/uL (ref 0.0–0.5)
Eosinophils Relative: 2 %
HCT: 34.9 % — ABNORMAL LOW (ref 36.0–46.0)
Hemoglobin: 11.1 g/dL — ABNORMAL LOW (ref 12.0–15.0)
Immature Granulocytes: 0 %
Lymphocytes Relative: 12 %
Lymphs Abs: 1.2 10*3/uL (ref 0.7–4.0)
MCH: 28.8 pg (ref 26.0–34.0)
MCHC: 31.8 g/dL (ref 30.0–36.0)
MCV: 90.6 fL (ref 80.0–100.0)
Monocytes Absolute: 0.6 10*3/uL (ref 0.1–1.0)
Monocytes Relative: 5 %
Neutro Abs: 8.3 10*3/uL — ABNORMAL HIGH (ref 1.7–7.7)
Neutrophils Relative %: 80 %
Platelets: 189 10*3/uL (ref 150–400)
RBC: 3.85 MIL/uL — ABNORMAL LOW (ref 3.87–5.11)
RDW: 13.4 % (ref 11.5–15.5)
WBC: 10.4 10*3/uL (ref 4.0–10.5)
nRBC: 0 % (ref 0.0–0.2)

## 2018-07-09 LAB — COMPREHENSIVE METABOLIC PANEL
ALT: 13 U/L (ref 0–44)
AST: 19 U/L (ref 15–41)
Albumin: 3.2 g/dL — ABNORMAL LOW (ref 3.5–5.0)
Alkaline Phosphatase: 47 U/L (ref 38–126)
Anion gap: 10 (ref 5–15)
BUN: 33 mg/dL — ABNORMAL HIGH (ref 8–23)
CO2: 24 mmol/L (ref 22–32)
Calcium: 8.3 mg/dL — ABNORMAL LOW (ref 8.9–10.3)
Chloride: 104 mmol/L (ref 98–111)
Creatinine, Ser: 1.49 mg/dL — ABNORMAL HIGH (ref 0.44–1.00)
GFR calc Af Amer: 37 mL/min — ABNORMAL LOW (ref >60)
GFR calc non Af Amer: 32 mL/min — ABNORMAL LOW (ref >60)
Glucose, Bld: 123 mg/dL — ABNORMAL HIGH (ref 70–99)
Potassium: 4.6 mmol/L (ref 3.5–5.1)
Sodium: 138 mmol/L (ref 135–145)
Total Bilirubin: 0.9 mg/dL (ref 0.3–1.2)
Total Protein: 6.2 g/dL — ABNORMAL LOW (ref 6.5–8.1)

## 2018-07-09 LAB — LIPASE, BLOOD: Lipase: 23 U/L (ref 11–51)

## 2018-07-09 LAB — URINALYSIS, ROUTINE W REFLEX MICROSCOPIC
Bilirubin Urine: NEGATIVE
Glucose, UA: NEGATIVE mg/dL
Hgb urine dipstick: NEGATIVE
Ketones, ur: NEGATIVE mg/dL
Nitrite: NEGATIVE
Protein, ur: NEGATIVE mg/dL
Specific Gravity, Urine: 1.015 (ref 1.005–1.030)
pH: 5.5 (ref 5.0–8.0)

## 2018-07-09 MED ORDER — METRONIDAZOLE 500 MG PO TABS
500.0000 mg | ORAL_TABLET | Freq: Once | ORAL | Status: AC
  Administered 2018-07-09: 500 mg via ORAL
  Filled 2018-07-09: qty 1

## 2018-07-09 MED ORDER — CIPROFLOXACIN HCL 500 MG PO TABS: 500.0000 mg | ORAL_TABLET | Freq: Two times a day (BID) | ORAL | 0 refills | Status: DC

## 2018-07-09 MED ORDER — SODIUM CHLORIDE 0.9 % IV SOLN
INTRAVENOUS | Status: DC
  Administered 2018-07-09: 09:00:00 via INTRAVENOUS

## 2018-07-09 MED ORDER — METRONIDAZOLE 500 MG PO TABS: 500.0000 mg | ORAL_TABLET | Freq: Three times a day (TID) | ORAL | 0 refills | Status: AC

## 2018-07-09 MED ORDER — CIPROFLOXACIN HCL 500 MG PO TABS
500.0000 mg | ORAL_TABLET | Freq: Once | ORAL | Status: AC
  Administered 2018-07-09: 500 mg via ORAL
  Filled 2018-07-09: qty 1

## 2018-07-09 MED ORDER — IOHEXOL 300 MG/ML  SOLN
100.0000 mL | Freq: Once | INTRAMUSCULAR | Status: AC | PRN
  Administered 2018-07-09: 80 mL via INTRAVENOUS

## 2018-07-09 MED ORDER — POLYETHYLENE GLYCOL 3350 17 G PO PACK: 17.0000 g | PACK | Freq: Every day | ORAL | 1 refills | Status: DC

## 2018-07-09 MED FILL — metroNIDAZOLE 500 MG TABS: 500 | 7 days supply | Qty: 21 | Fill #0

## 2018-07-09 MED FILL — CIPROFLOXACIN HCL 500 MG TA: 500 | 7 days supply | Qty: 14 | Fill #0

## 2018-07-09 MED FILL — SM CLEARLAX POWDER: 17 | 15 days supply | Qty: 238 | Fill #0

## 2018-07-09 NOTE — Discharge Instructions (Addendum)
Take the antibiotics as directed.  For the next 7 days.  Return if not improving over the next 4 days.  Return for any new or worse symptoms.  Also take the MiraLAX at least once a day.  For the first day you can take it 4 times a day.  Follow-up with your doctor make an appointment

## 2018-07-09 NOTE — ED Provider Notes (Addendum)
[facility] EMERGENCY DEPARTMENT Provider Note   CSN: [id number] Arrival date & time: 07/09/18  0802    History   Chief Complaint Chief Complaint  Patient presents with  . Abdominal Pain    HPI Shelby Walker is a 85 y.o. female.     Patient with a complaint of bilateral lower abdominal pain that started on Saturday.  Associated with some crampy pain kind of a mucus discharge from the rectum no blood.  Some nausea but that is improved now no vomiting.  Denies any fevers.  No prior history of anything similar to this.  Denies any dysuria.  No back pain.  No cough or congestion.     Past Medical History:  Diagnosis Date  . Arthritis, rheumatoid ([place])   . Bruit    Carotid Doppler showed no significant abnormality     9per patient)  . Chest pain, unspecified    Nuclear, May, 2008, no scar or ischemia  . Diabetes mellitus   . Dyslipidemia   . Ejection fraction    EF 55-60%, echo, February, 2011  . GERD (gastroesophageal reflux disease)   . Mitral regurgitation April 21, 2009   mild,  echo, February, 2011  . Osteoporosis   . Palpitations    possible very brief atrial fibrillation on monitor and possible reentrant tachycardia  . Psoriasis   . Rheumatoid arthritis [facility])     Patient Active Problem List   Diagnosis Date Noted  . Mixed hyperlipidemia 09/29/2016  . Stable angina ([place]) 09/29/2016  . Claudication in peripheral vascular disease ([place]) 08/09/2016  . Preoperative clearance 05/21/2012  . Precordial chest pain   . Palpitations   . Diabetes mellitus   . GERD (gastroesophageal reflux disease)   . Ejection fraction   . Carotid artery disease ([place])   . Mitral regurgitation 04/21/2009  . PSORIASIS 02/09/2009  . RHEUMATOID ARTHRITIS 02/09/2009    History reviewed. No pertinent surgical history.   OB History   No obstetric history on file.      Home Medications    Prior to Admission medications   Medication Sig Start Date End Date Taking?  Authorizing Provider  bisacodyl (DULCOLAX) 5 MG EC tablet Take 5 mg by mouth daily as needed for moderate constipation.   Yes [provider]  polyethylene glycol (MIRALAX / GLYCOLAX) 17 g packet Take 17 g by mouth daily.   Yes [provider]  aspirin 81 MG tablet Take 81 mg by mouth daily as needed for pain.     [provider]  atorvastatin (LIPITOR) 20 MG tablet TAKE 1 TABLET BY MOUTH EVERY DAY 01/27/18   End, Harrell Gave, MD  Calcium Carbonate-Vitamin D (CALTRATE 600+D) 600-400 MG-UNIT per tablet Take 1 tablet by mouth daily.      [provider]  Cholecalciferol 1000 units capsule Take 1,000 Units by mouth daily.     [provider]  ciprofloxacin (CIPRO) 500 MG tablet Take 1 tablet (500 mg total) by mouth 2 (two) times daily. 07/09/18   Fredia Sorrow, MD  esomeprazole (NEXIUM) 40 MG capsule Take 40 mg by mouth daily at 12 noon.    [provider]  etanercept (ENBREL) 50 MG/ML injection Inject 50 mg into the skin once a week.    [provider]  HUMALOG KWIKPEN 100 UNIT/ML KiwkPen Inject 10 Units into the skin 2 (two) times daily with a meal. 09/04/17   [provider]  Insulin Glargine (BASAGLAR KWIKPEN) 100 UNIT/ML SOPN Inject 20 Units  into the skin at bedtime. 10/04/17   [provider]  metFORMIN (GLUCOPHAGE) 500 MG tablet Take 500 mg by mouth daily with breakfast.  09/22/17   [provider]  metoprolol succinate (TOPROL-XL) 25 MG 24 hr tablet Take 25 mg by mouth daily.      [provider]  metroNIDAZOLE (FLAGYL) 500 MG tablet Take 1 tablet (500 mg total) by mouth 3 (three) times daily for 7 days. 07/09/18 07/16/18  Fredia Sorrow, MD  Multiple Vitamins-Minerals (CENTRUM SILVER ULTRA WOMENS) TABS Take by mouth. 1 tab daily    [provider]  nitroGLYCERIN (NITROSTAT) 0.4 MG SL tablet 1 TABLET UNDER TONGUE AT ONSET OF CHEST PAIN YOU MAY REPEAT EVERY 5 MINUTES FOR UP TO 3 DOSES. 04/22/18    Nahser, Wonda Cheng, MD  polyethylene glycol (MIRALAX) 17 g packet Take 17 g by mouth daily. 07/09/18   Fredia Sorrow, MD  predniSONE (DELTASONE) 5 MG tablet Take 5 mg by mouth daily. 08/31/16   [provider]    Family History Family History  Problem Relation Age of Onset  . Heart failure Father   . Heart attack Father   . Diabetes Father   . Heart attack Mother   . Heart failure Mother   . Diabetes Mother   . Stroke Sister     Social History Social History   Tobacco Use  . Smoking status: Never Smoker  . Smokeless tobacco: Never Used  Substance Use Topics  . Alcohol use: No    Alcohol/week: 0.0 standard drinks  . Drug use: No     Allergies   Compazine [prochlorperazine edisylate] and Sulfonamide derivatives   Review of Systems Review of Systems  Constitutional: Negative for chills and fever.  HENT: Negative for rhinorrhea and sore throat.   Eyes: Negative for visual disturbance.  Respiratory: Negative for cough and shortness of breath.   Cardiovascular: Negative for chest pain and leg swelling.  Gastrointestinal: Positive for abdominal pain and nausea. Negative for blood in stool, diarrhea and vomiting.  Genitourinary: Negative for dysuria.  Musculoskeletal: Negative for back pain and neck pain.  Skin: Negative for rash.  Neurological: Negative for dizziness, light-headedness and headaches.  Hematological: Does not bruise/bleed easily.  Psychiatric/Behavioral: Negative for confusion.     Physical Exam Updated Vital Signs BP (!) 113/55   Pulse 69   Temp 98.4 F (36.9 C) (Oral)   Resp 18   Ht 1.562 m (5' 1.5")   Wt 59.9 kg   SpO2 100%   BMI 24.54 kg/m   Physical Exam Vitals signs and nursing note reviewed.  Constitutional:      General: She is not in acute distress.    Appearance: Normal appearance. She is well-developed. She is not toxic-appearing.  HENT:     Head: Normocephalic and atraumatic.  Eyes:     Extraocular Movements:  Extraocular movements intact.     Conjunctiva/sclera: Conjunctivae normal.     Pupils: Pupils are equal, round, and reactive to light.  Neck:     Musculoskeletal: Normal range of motion and neck supple.  Cardiovascular:     Rate and Rhythm: Normal rate and regular rhythm.     Heart sounds: No murmur.  Pulmonary:     Effort: Pulmonary effort is normal. No respiratory distress.     Breath sounds: Normal breath sounds.  Abdominal:     General: There is no distension.     Palpations: Abdomen is soft.     Tenderness: There is no  abdominal tenderness. There is no guarding.  Musculoskeletal:        General: No swelling.  Skin:    General: Skin is warm and dry.  Neurological:     General: No focal deficit present.     Mental Status: She is alert and oriented to person, place, and time.      ED Treatments / Results  Labs (all labs ordered are listed, but only abnormal results are displayed) Labs Reviewed  COMPREHENSIVE METABOLIC PANEL - Abnormal; Notable for the following components:      Result Value   Glucose, Bld 123 (*)    BUN 33 (*)    Creatinine, Ser 1.49 (*)    Calcium 8.3 (*)    Total Protein 6.2 (*)    Albumin 3.2 (*)    GFR calc non Af Amer 32 (*)    GFR calc Af Amer 37 (*)    All other components within normal limits  CBC WITH DIFFERENTIAL/PLATELET - Abnormal; Notable for the following components:   RBC 3.85 (*)    Hemoglobin 11.1 (*)    HCT 34.9 (*)    Neutro Abs 8.3 (*)    All other components within normal limits  URINALYSIS, ROUTINE W REFLEX MICROSCOPIC - Abnormal; Notable for the following components:   Leukocytes,Ua SMALL (*)    All other components within normal limits  URINALYSIS, MICROSCOPIC (REFLEX) - Abnormal; Notable for the following components:   Bacteria, UA FEW (*)    All other components within normal limits  URINE CULTURE  LIPASE, BLOOD    EKG None  Radiology Ct Abdomen Pelvis W Contrast  Result Date: 07/09/2018 CLINICAL DATA:  Low  abdominal cramping, suspect diverticulitis EXAM: CT ABDOMEN AND PELVIS WITH CONTRAST TECHNIQUE: Multidetector CT imaging of the abdomen and pelvis was performed using the standard protocol following bolus administration of intravenous contrast. CONTRAST:  79mL OMNIPAQUE IOHEXOL 300 MG/ML  SOLN COMPARISON:  None. FINDINGS: Lower chest: No acute abnormality. Hepatobiliary: No focal liver abnormality is seen. No gallstones, gallbladder wall thickening, or biliary dilatation. Pancreas: Unremarkable. No pancreatic ductal dilatation or surrounding inflammatory changes. Spleen: Normal in size without focal abnormality. Adrenals/Urinary Tract: Adrenal glands are unremarkable. Kidneys are normal, without renal calculi, focal lesion, or hydronephrosis. Bladder is unremarkable. Stomach/Bowel: Stomach is within normal limits. Appendix appears normal. There is severe sigmoid diverticulosis with wall thickening and fat stranding about the mid to distal sigmoid. Large burden of stool in the colon. Vascular/Lymphatic: Mixed calcific atherosclerosis. No enlarged abdominal or pelvic lymph nodes. Reproductive: No mass or other abnormality. Other: No abdominal wall hernia or abnormality. No abdominopelvic ascites. Musculoskeletal: No acute or significant osseous findings. IMPRESSION: There is severe sigmoid diverticulosis with wall thickening and fat stranding about the mid to distal sigmoid. No evidence of perforation or abscess. Electronically Signed   By: Eddie Candle M.D.   On: 07/09/2018 10:29    Procedures Procedures (including critical care time)  Medications Ordered in ED Medications  0.9 %  sodium chloride infusion ( Intravenous New Bag/Given 07/09/18 0854)  ciprofloxacin (CIPRO) tablet 500 mg (has no administration in time range)  metroNIDAZOLE (FLAGYL) tablet 500 mg (has no administration in time range)  iohexol (OMNIPAQUE) 300 MG/ML solution 100 mL (80 mLs Intravenous Contrast Given 07/09/18 0953)     Initial  Impression / Assessment and Plan / ED Course  I have reviewed the triage vital signs and the nursing notes.  Pertinent labs & imaging results that were available during my care of  the patient were reviewed by me and considered in my medical decision making (see chart for details).        Presentation highly suggestive of diverticulitis.  Will do CT scan abdomen pelvis for further evaluation along with a CBC complete metabolic panel and lipase.  In addition urinalysis will be done.  CT scan depicts large stool burden with sigmoid colon area lots of diverticulosis.  Some thickening and some fat stranding I think suggestive of early diverticulitis.  Will treat patient with oral Flagyl and Cipro first dose provided in the emergency department.  Also will treat her with MiraLAX.  Patient will return if not improving in 4 days or earlier if worse.  In addition CT scan shows no complicating factors of any abscess or perforation.  No evidence of bowel obstruction.  Final Clinical Impressions(s) / ED Diagnoses   Final diagnoses:  Lower abdominal pain  Diverticulitis large intestine w/o perforation or abscess w/o bleeding  Constipation, unspecified constipation type    ED Discharge Orders         Ordered    ciprofloxacin (CIPRO) 500 MG tablet  2 times daily     07/09/18 1051    metroNIDAZOLE (FLAGYL) 500 MG tablet  3 times daily     07/09/18 1051    polyethylene glycol (MIRALAX) 17 g packet  Daily     07/09/18 1051           Fredia Sorrow, MD 07/09/18 1047    Fredia Sorrow, MD 07/09/18 1051

## 2018-07-09 NOTE — ED Triage Notes (Signed)
Lower abdominal pain since Saturday.   No known fever.  Some chills on Sunday.  Took laxatives on Sunday   Some mucoid stool after the laxative.  No recent antibiotics.  Pt also states she has a new onset skin rash on both arms and chest.

## 2018-07-09 NOTE — ED Notes (Signed)
Patient transported to CT 

## 2018-07-10 LAB — URINE CULTURE: Culture: NO GROWTH

## 2018-07-17 DIAGNOSIS — E1129 Type 2 diabetes mellitus with other diabetic kidney complication: Secondary | ICD-10-CM | POA: Diagnosis not present

## 2018-07-17 DIAGNOSIS — N183 Chronic kidney disease, stage 3 (moderate): Secondary | ICD-10-CM | POA: Diagnosis not present

## 2018-07-17 DIAGNOSIS — I129 Hypertensive chronic kidney disease with stage 1 through stage 4 chronic kidney disease, or unspecified chronic kidney disease: Secondary | ICD-10-CM | POA: Diagnosis not present

## 2018-07-17 DIAGNOSIS — K5792 Diverticulitis of intestine, part unspecified, without perforation or abscess without bleeding: Secondary | ICD-10-CM | POA: Diagnosis not present

## 2018-07-21 ENCOUNTER — Other Ambulatory Visit: Payer: Self-pay | Admitting: Endocrinology

## 2018-07-21 DIAGNOSIS — W01198A Fall on same level from slipping, tripping and stumbling with subsequent striking against other object, initial encounter: Secondary | ICD-10-CM | POA: Diagnosis not present

## 2018-07-21 DIAGNOSIS — I129 Hypertensive chronic kidney disease with stage 1 through stage 4 chronic kidney disease, or unspecified chronic kidney disease: Secondary | ICD-10-CM | POA: Diagnosis not present

## 2018-07-21 DIAGNOSIS — N183 Chronic kidney disease, stage 3 (moderate): Secondary | ICD-10-CM | POA: Diagnosis not present

## 2018-07-21 DIAGNOSIS — M069 Rheumatoid arthritis, unspecified: Secondary | ICD-10-CM | POA: Diagnosis not present

## 2018-07-21 DIAGNOSIS — S0093XA Contusion of unspecified part of head, initial encounter: Secondary | ICD-10-CM | POA: Diagnosis not present

## 2018-07-22 ENCOUNTER — Ambulatory Visit
Admission: RE | Admit: 2018-07-22 | Discharge: 2018-07-22 | Disposition: A | Discharge status: Home / Self Care | Payer: Medicare Other | Source: Ambulatory Visit | Attending: Endocrinology | Admitting: Endocrinology

## 2018-07-22 ENCOUNTER — Other Ambulatory Visit: Payer: Self-pay

## 2018-07-22 DIAGNOSIS — S0093XA Contusion of unspecified part of head, initial encounter: Secondary | ICD-10-CM

## 2018-07-22 DIAGNOSIS — R51 Headache: Secondary | ICD-10-CM | POA: Diagnosis not present

## 2018-07-23 ENCOUNTER — Telehealth: Payer: Self-pay | Admitting: Internal Medicine

## 2018-07-23 NOTE — Telephone Encounter (Signed)
As patient is new to GI.  Please offer appt with another MD. Dr. Lyndel Safe can do a virtual visit on Friday.

## 2018-07-23 NOTE — Telephone Encounter (Signed)
Dr. Forde Dandy nurse from [facility] Called would like to know if we can do anything to help the patient to be seen sooner. Patient is getting worse and is not making it to the bathroom no more. Patient is scheduled for next Friday 08-01-18. Best phone # to reach the patient is her daughter Shelby Walker [phone]. I did let them know we don't have anything else sooner.

## 2018-07-24 ENCOUNTER — Emergency Department (HOSPITAL_BASED_OUTPATIENT_CLINIC_OR_DEPARTMENT_OTHER): Payer: Medicare Other

## 2018-07-24 ENCOUNTER — Encounter (HOSPITAL_BASED_OUTPATIENT_CLINIC_OR_DEPARTMENT_OTHER): Payer: Self-pay | Admitting: *Deleted

## 2018-07-24 ENCOUNTER — Other Ambulatory Visit: Payer: Self-pay

## 2018-07-24 ENCOUNTER — Inpatient Hospital Stay (HOSPITAL_BASED_OUTPATIENT_CLINIC_OR_DEPARTMENT_OTHER)
Admission: EM | Admit: 2018-07-24 | Discharge: 2018-08-06 | DRG: 871 | Disposition: A | Discharge status: Rehabilitation Facility | Payer: Medicare Other | Attending: Internal Medicine | Admitting: Internal Medicine

## 2018-07-24 DIAGNOSIS — I739 Peripheral vascular disease, unspecified: Secondary | ICD-10-CM | POA: Diagnosis present

## 2018-07-24 DIAGNOSIS — E785 Hyperlipidemia, unspecified: Secondary | ICD-10-CM | POA: Diagnosis present

## 2018-07-24 DIAGNOSIS — N179 Acute kidney failure, unspecified: Secondary | ICD-10-CM | POA: Diagnosis not present

## 2018-07-24 DIAGNOSIS — M810 Age-related osteoporosis without current pathological fracture: Secondary | ICD-10-CM | POA: Diagnosis present

## 2018-07-24 DIAGNOSIS — E872 Acidosis: Secondary | ICD-10-CM | POA: Diagnosis not present

## 2018-07-24 DIAGNOSIS — R52 Pain, unspecified: Secondary | ICD-10-CM | POA: Diagnosis not present

## 2018-07-24 DIAGNOSIS — R601 Generalized edema: Secondary | ICD-10-CM

## 2018-07-24 DIAGNOSIS — Z66 Do not resuscitate: Secondary | ICD-10-CM | POA: Diagnosis present

## 2018-07-24 DIAGNOSIS — I959 Hypotension, unspecified: Secondary | ICD-10-CM | POA: Diagnosis not present

## 2018-07-24 DIAGNOSIS — E8770 Fluid overload, unspecified: Secondary | ICD-10-CM | POA: Diagnosis not present

## 2018-07-24 DIAGNOSIS — I129 Hypertensive chronic kidney disease with stage 1 through stage 4 chronic kidney disease, or unspecified chronic kidney disease: Secondary | ICD-10-CM | POA: Diagnosis present

## 2018-07-24 DIAGNOSIS — K219 Gastro-esophageal reflux disease without esophagitis: Secondary | ICD-10-CM | POA: Diagnosis present

## 2018-07-24 DIAGNOSIS — R0789 Other chest pain: Secondary | ICD-10-CM | POA: Diagnosis not present

## 2018-07-24 DIAGNOSIS — Z452 Encounter for adjustment and management of vascular access device: Secondary | ICD-10-CM | POA: Diagnosis not present

## 2018-07-24 DIAGNOSIS — R579 Shock, unspecified: Secondary | ICD-10-CM | POA: Diagnosis not present

## 2018-07-24 DIAGNOSIS — E1122 Type 2 diabetes mellitus with diabetic chronic kidney disease: Secondary | ICD-10-CM | POA: Diagnosis present

## 2018-07-24 DIAGNOSIS — R0602 Shortness of breath: Secondary | ICD-10-CM | POA: Diagnosis not present

## 2018-07-24 DIAGNOSIS — E873 Alkalosis: Secondary | ICD-10-CM | POA: Diagnosis present

## 2018-07-24 DIAGNOSIS — R5381 Other malaise: Secondary | ICD-10-CM | POA: Diagnosis present

## 2018-07-24 DIAGNOSIS — E1165 Type 2 diabetes mellitus with hyperglycemia: Secondary | ICD-10-CM | POA: Diagnosis present

## 2018-07-24 DIAGNOSIS — N183 Chronic kidney disease, stage 3 (moderate): Secondary | ICD-10-CM | POA: Diagnosis present

## 2018-07-24 DIAGNOSIS — R062 Wheezing: Secondary | ICD-10-CM | POA: Diagnosis not present

## 2018-07-24 DIAGNOSIS — I952 Hypotension due to drugs: Secondary | ICD-10-CM | POA: Diagnosis not present

## 2018-07-24 DIAGNOSIS — R188 Other ascites: Secondary | ICD-10-CM | POA: Diagnosis not present

## 2018-07-24 DIAGNOSIS — R34 Anuria and oliguria: Secondary | ICD-10-CM | POA: Diagnosis not present

## 2018-07-24 DIAGNOSIS — E46 Unspecified protein-calorie malnutrition: Secondary | ICD-10-CM | POA: Diagnosis not present

## 2018-07-24 DIAGNOSIS — N189 Chronic kidney disease, unspecified: Secondary | ICD-10-CM

## 2018-07-24 DIAGNOSIS — A419 Sepsis, unspecified organism: Secondary | ICD-10-CM | POA: Diagnosis not present

## 2018-07-24 DIAGNOSIS — S79912A Unspecified injury of left hip, initial encounter: Secondary | ICD-10-CM | POA: Diagnosis not present

## 2018-07-24 DIAGNOSIS — T447X5A Adverse effect of beta-adrenoreceptor antagonists, initial encounter: Secondary | ICD-10-CM | POA: Diagnosis not present

## 2018-07-24 DIAGNOSIS — M069 Rheumatoid arthritis, unspecified: Secondary | ICD-10-CM | POA: Diagnosis present

## 2018-07-24 DIAGNOSIS — R6521 Severe sepsis with septic shock: Secondary | ICD-10-CM | POA: Diagnosis not present

## 2018-07-24 DIAGNOSIS — E782 Mixed hyperlipidemia: Secondary | ICD-10-CM | POA: Diagnosis present

## 2018-07-24 DIAGNOSIS — R059 Cough, unspecified: Secondary | ICD-10-CM

## 2018-07-24 DIAGNOSIS — E860 Dehydration: Secondary | ICD-10-CM

## 2018-07-24 DIAGNOSIS — Z79899 Other long term (current) drug therapy: Secondary | ICD-10-CM

## 2018-07-24 DIAGNOSIS — R531 Weakness: Secondary | ICD-10-CM | POA: Diagnosis not present

## 2018-07-24 DIAGNOSIS — Z20828 Contact with and (suspected) exposure to other viral communicable diseases: Secondary | ICD-10-CM | POA: Diagnosis not present

## 2018-07-24 DIAGNOSIS — N19 Unspecified kidney failure: Secondary | ICD-10-CM | POA: Diagnosis not present

## 2018-07-24 DIAGNOSIS — A0472 Enterocolitis due to Clostridium difficile, not specified as recurrent: Secondary | ICD-10-CM | POA: Diagnosis present

## 2018-07-24 DIAGNOSIS — K5732 Diverticulitis of large intestine without perforation or abscess without bleeding: Secondary | ICD-10-CM | POA: Diagnosis not present

## 2018-07-24 DIAGNOSIS — E876 Hypokalemia: Secondary | ICD-10-CM | POA: Diagnosis not present

## 2018-07-24 DIAGNOSIS — J9690 Respiratory failure, unspecified, unspecified whether with hypoxia or hypercapnia: Secondary | ICD-10-CM | POA: Diagnosis not present

## 2018-07-24 DIAGNOSIS — E119 Type 2 diabetes mellitus without complications: Secondary | ICD-10-CM

## 2018-07-24 DIAGNOSIS — I480 Paroxysmal atrial fibrillation: Secondary | ICD-10-CM | POA: Diagnosis present

## 2018-07-24 DIAGNOSIS — T380X5A Adverse effect of glucocorticoids and synthetic analogues, initial encounter: Secondary | ICD-10-CM | POA: Diagnosis present

## 2018-07-24 DIAGNOSIS — D62 Acute posthemorrhagic anemia: Secondary | ICD-10-CM | POA: Diagnosis not present

## 2018-07-24 DIAGNOSIS — E1151 Type 2 diabetes mellitus with diabetic peripheral angiopathy without gangrene: Secondary | ICD-10-CM | POA: Diagnosis present

## 2018-07-24 DIAGNOSIS — R571 Hypovolemic shock: Secondary | ICD-10-CM | POA: Diagnosis present

## 2018-07-24 DIAGNOSIS — I340 Nonrheumatic mitral (valve) insufficiency: Secondary | ICD-10-CM | POA: Diagnosis present

## 2018-07-24 DIAGNOSIS — R079 Chest pain, unspecified: Secondary | ICD-10-CM | POA: Diagnosis not present

## 2018-07-24 DIAGNOSIS — K828 Other specified diseases of gallbladder: Secondary | ICD-10-CM | POA: Diagnosis not present

## 2018-07-24 DIAGNOSIS — Z794 Long term (current) use of insulin: Secondary | ICD-10-CM | POA: Diagnosis not present

## 2018-07-24 DIAGNOSIS — N170 Acute kidney failure with tubular necrosis: Secondary | ICD-10-CM | POA: Diagnosis not present

## 2018-07-24 DIAGNOSIS — E440 Moderate protein-calorie malnutrition: Secondary | ICD-10-CM | POA: Diagnosis present

## 2018-07-24 DIAGNOSIS — K5792 Diverticulitis of intestine, part unspecified, without perforation or abscess without bleeding: Secondary | ICD-10-CM

## 2018-07-24 DIAGNOSIS — R739 Hyperglycemia, unspecified: Secondary | ICD-10-CM | POA: Diagnosis not present

## 2018-07-24 DIAGNOSIS — R197 Diarrhea, unspecified: Secondary | ICD-10-CM | POA: Diagnosis not present

## 2018-07-24 DIAGNOSIS — J90 Pleural effusion, not elsewhere classified: Secondary | ICD-10-CM | POA: Diagnosis not present

## 2018-07-24 DIAGNOSIS — M5136 Other intervertebral disc degeneration, lumbar region: Secondary | ICD-10-CM | POA: Diagnosis present

## 2018-07-24 DIAGNOSIS — J9811 Atelectasis: Secondary | ICD-10-CM | POA: Diagnosis not present

## 2018-07-24 DIAGNOSIS — D72829 Elevated white blood cell count, unspecified: Secondary | ICD-10-CM | POA: Diagnosis not present

## 2018-07-24 DIAGNOSIS — A414 Sepsis due to anaerobes: Principal | ICD-10-CM | POA: Diagnosis present

## 2018-07-24 DIAGNOSIS — D631 Anemia in chronic kidney disease: Secondary | ICD-10-CM | POA: Diagnosis present

## 2018-07-24 DIAGNOSIS — R9431 Abnormal electrocardiogram [ECG] [EKG]: Secondary | ICD-10-CM | POA: Diagnosis not present

## 2018-07-24 DIAGNOSIS — E874 Mixed disorder of acid-base balance: Secondary | ICD-10-CM | POA: Diagnosis not present

## 2018-07-24 DIAGNOSIS — Z8249 Family history of ischemic heart disease and other diseases of the circulatory system: Secondary | ICD-10-CM

## 2018-07-24 DIAGNOSIS — I4891 Unspecified atrial fibrillation: Secondary | ICD-10-CM | POA: Diagnosis not present

## 2018-07-24 DIAGNOSIS — Z888 Allergy status to other drugs, medicaments and biological substances status: Secondary | ICD-10-CM

## 2018-07-24 DIAGNOSIS — Z6826 Body mass index (BMI) 26.0-26.9, adult: Secondary | ICD-10-CM

## 2018-07-24 DIAGNOSIS — K529 Noninfective gastroenteritis and colitis, unspecified: Secondary | ICD-10-CM | POA: Diagnosis present

## 2018-07-24 DIAGNOSIS — R1084 Generalized abdominal pain: Secondary | ICD-10-CM | POA: Diagnosis not present

## 2018-07-24 DIAGNOSIS — I10 Essential (primary) hypertension: Secondary | ICD-10-CM | POA: Diagnosis not present

## 2018-07-24 DIAGNOSIS — Z882 Allergy status to sulfonamides status: Secondary | ICD-10-CM

## 2018-07-24 DIAGNOSIS — L409 Psoriasis, unspecified: Secondary | ICD-10-CM | POA: Diagnosis present

## 2018-07-24 DIAGNOSIS — D696 Thrombocytopenia, unspecified: Secondary | ICD-10-CM | POA: Diagnosis present

## 2018-07-24 DIAGNOSIS — Z833 Family history of diabetes mellitus: Secondary | ICD-10-CM

## 2018-07-24 DIAGNOSIS — M25552 Pain in left hip: Secondary | ICD-10-CM | POA: Diagnosis not present

## 2018-07-24 DIAGNOSIS — R05 Cough: Secondary | ICD-10-CM

## 2018-07-24 DIAGNOSIS — Z7952 Long term (current) use of systemic steroids: Secondary | ICD-10-CM

## 2018-07-24 DIAGNOSIS — R001 Bradycardia, unspecified: Secondary | ICD-10-CM | POA: Diagnosis not present

## 2018-07-24 DIAGNOSIS — R0603 Acute respiratory distress: Secondary | ICD-10-CM

## 2018-07-24 DIAGNOSIS — Z7982 Long term (current) use of aspirin: Secondary | ICD-10-CM

## 2018-07-24 DIAGNOSIS — R451 Restlessness and agitation: Secondary | ICD-10-CM | POA: Diagnosis not present

## 2018-07-24 LAB — SARS CORONAVIRUS 2 AG (30 MIN TAT): SARS Coronavirus 2 Ag: NEGATIVE

## 2018-07-24 LAB — URINALYSIS, ROUTINE W REFLEX MICROSCOPIC
Bilirubin Urine: NEGATIVE
Glucose, UA: NEGATIVE mg/dL
Ketones, ur: 40 mg/dL — AB
Nitrite: NEGATIVE
Protein, ur: NEGATIVE mg/dL
Specific Gravity, Urine: 1.01 (ref 1.005–1.030)
pH: 5.5 (ref 5.0–8.0)

## 2018-07-24 LAB — CBC WITH DIFFERENTIAL/PLATELET
Abs Immature Granulocytes: 0.08 10*3/uL — ABNORMAL HIGH (ref 0.00–0.07)
Basophils Absolute: 0.1 10*3/uL (ref 0.0–0.1)
Basophils Relative: 1 %
Eosinophils Absolute: 0.1 10*3/uL (ref 0.0–0.5)
Eosinophils Relative: 1 %
HCT: 36.4 % (ref 36.0–46.0)
Hemoglobin: 11.4 g/dL — ABNORMAL LOW (ref 12.0–15.0)
Immature Granulocytes: 1 %
Lymphocytes Relative: 8 %
Lymphs Abs: 1.3 10*3/uL (ref 0.7–4.0)
MCH: 29.1 pg (ref 26.0–34.0)
MCHC: 31.3 g/dL (ref 30.0–36.0)
MCV: 92.9 fL (ref 80.0–100.0)
Monocytes Absolute: 1.2 10*3/uL — ABNORMAL HIGH (ref 0.1–1.0)
Monocytes Relative: 8 %
Neutro Abs: 12.6 10*3/uL — ABNORMAL HIGH (ref 1.7–7.7)
Neutrophils Relative %: 81 %
Platelets: 247 10*3/uL (ref 150–400)
RBC: 3.92 MIL/uL (ref 3.87–5.11)
RDW: 14.2 % (ref 11.5–15.5)
WBC: 15.3 10*3/uL — ABNORMAL HIGH (ref 4.0–10.5)
nRBC: 0 % (ref 0.0–0.2)

## 2018-07-24 LAB — COMPREHENSIVE METABOLIC PANEL
ALT: 11 U/L (ref 0–44)
AST: 14 U/L — ABNORMAL LOW (ref 15–41)
Albumin: 2.9 g/dL — ABNORMAL LOW (ref 3.5–5.0)
Alkaline Phosphatase: 41 U/L (ref 38–126)
Anion gap: 11 (ref 5–15)
BUN: 23 mg/dL (ref 8–23)
CO2: 19 mmol/L — ABNORMAL LOW (ref 22–32)
Calcium: 7.4 mg/dL — ABNORMAL LOW (ref 8.9–10.3)
Chloride: 105 mmol/L (ref 98–111)
Creatinine, Ser: 1.22 mg/dL — ABNORMAL HIGH (ref 0.44–1.00)
GFR calc Af Amer: 47 mL/min — ABNORMAL LOW (ref >60)
GFR calc non Af Amer: 41 mL/min — ABNORMAL LOW (ref >60)
Glucose, Bld: 155 mg/dL — ABNORMAL HIGH (ref 70–99)
Potassium: 4.2 mmol/L (ref 3.5–5.1)
Sodium: 135 mmol/L (ref 135–145)
Total Bilirubin: 1 mg/dL (ref 0.3–1.2)
Total Protein: 5.9 g/dL — ABNORMAL LOW (ref 6.5–8.1)

## 2018-07-24 LAB — LACTIC ACID, PLASMA: Lactic Acid, Venous: 1.4 mmol/L (ref 0.5–1.9)

## 2018-07-24 LAB — C DIFFICILE QUICK SCREEN W PCR REFLEX
C Diff antigen: POSITIVE — AB
C Diff interpretation: DETECTED
C Diff toxin: POSITIVE — AB

## 2018-07-24 LAB — LIPASE, BLOOD: Lipase: 23 U/L (ref 11–51)

## 2018-07-24 LAB — HEMOGLOBIN A1C
Hgb A1c MFr Bld: 8.8 % — ABNORMAL HIGH (ref 4.8–5.6)
Mean Plasma Glucose: 205.86 mg/dL

## 2018-07-24 LAB — URINALYSIS, MICROSCOPIC (REFLEX)

## 2018-07-24 LAB — TROPONIN I: Troponin I: <0.03 ng/mL (ref <0.03)

## 2018-07-24 LAB — MAGNESIUM: Magnesium: 1.8 mg/dL (ref 1.7–2.4)

## 2018-07-24 LAB — GLUCOSE, CAPILLARY: Glucose-Capillary: 123 mg/dL — ABNORMAL HIGH (ref 70–99)

## 2018-07-24 LAB — TSH: TSH: 1.562 u[IU]/mL (ref 0.350–4.500)

## 2018-07-24 MED ORDER — IOHEXOL 300 MG/ML  SOLN
100.0000 mL | Freq: Once | INTRAMUSCULAR | Status: AC | PRN
  Administered 2018-07-24: 100 mL via INTRAVENOUS

## 2018-07-24 MED ORDER — ENOXAPARIN SODIUM 40 MG/0.4ML ~~LOC~~ SOLN
40.0000 mg | SUBCUTANEOUS | Status: DC
  Administered 2018-07-25: 40 mg via SUBCUTANEOUS
  Filled 2018-07-24: qty 0.4

## 2018-07-24 MED ORDER — MORPHINE SULFATE (PF) 4 MG/ML IV SOLN
4.0000 mg | Freq: Once | INTRAVENOUS | Status: AC
  Administered 2018-07-24: 4 mg via INTRAVENOUS
  Filled 2018-07-24: qty 1

## 2018-07-24 MED ORDER — DILTIAZEM HCL 60 MG PO TABS
60.0000 mg | ORAL_TABLET | Freq: Every day | ORAL | Status: DC
  Filled 2018-07-24: qty 1

## 2018-07-24 MED ORDER — SODIUM CHLORIDE 0.9 % IV BOLUS
500.0000 mL | Freq: Once | INTRAVENOUS | Status: AC
  Administered 2018-07-24: 500 mL via INTRAVENOUS

## 2018-07-24 MED ORDER — METOPROLOL SUCCINATE ER 25 MG PO TB24: 25.0000 mg | ORAL_TABLET | Freq: Every day | ORAL | Status: DC

## 2018-07-24 MED ORDER — ENOXAPARIN SODIUM 40 MG/0.4ML ~~LOC~~ SOLN
30.0000 mg | SUBCUTANEOUS | Status: DC
  Administered 2018-07-24: 30 mg via SUBCUTANEOUS
  Filled 2018-07-24: qty 0.4

## 2018-07-24 MED ORDER — ONDANSETRON HCL 4 MG/2ML IJ SOLN
4.0000 mg | Freq: Four times a day (QID) | INTRAMUSCULAR | Status: DC | PRN
  Administered 2018-07-24 – 2018-08-04 (×10): 4 mg via INTRAVENOUS
  Filled 2018-07-24 (×10): qty 2

## 2018-07-24 MED ORDER — SODIUM CHLORIDE 0.9 % IV SOLN
INTRAVENOUS | Status: DC
  Administered 2018-07-24 – 2018-07-25 (×2): via INTRAVENOUS

## 2018-07-24 MED ORDER — PREDNISONE 5 MG PO TABS: 5.0000 mg | ORAL_TABLET | Freq: Every day | ORAL | Status: DC

## 2018-07-24 MED ORDER — ONDANSETRON HCL 4 MG/2ML IJ SOLN
4.0000 mg | Freq: Once | INTRAMUSCULAR | Status: AC
  Administered 2018-07-24: 11:00:00 4 mg via INTRAVENOUS
  Filled 2018-07-24: qty 2

## 2018-07-24 MED ORDER — LEVOFLOXACIN IN D5W 750 MG/150ML IV SOLN: 750.0000 mg | INTRAVENOUS | Status: DC

## 2018-07-24 MED ORDER — INSULIN ASPART 100 UNIT/ML ~~LOC~~ SOLN
0.0000 [IU] | Freq: Three times a day (TID) | SUBCUTANEOUS | Status: DC
  Administered 2018-07-25 (×3): 1 [IU] via SUBCUTANEOUS
  Administered 2018-07-26 (×2): 2 [IU] via SUBCUTANEOUS
  Administered 2018-07-26: 3 [IU] via SUBCUTANEOUS
  Administered 2018-07-27: 2 [IU] via SUBCUTANEOUS
  Administered 2018-07-27: 17:00:00 1 [IU] via SUBCUTANEOUS
  Administered 2018-07-27: 2 [IU] via SUBCUTANEOUS
  Administered 2018-07-28: 17:00:00 3 [IU] via SUBCUTANEOUS
  Administered 2018-07-28: 1 [IU] via SUBCUTANEOUS
  Administered 2018-07-29: 7 [IU] via SUBCUTANEOUS
  Administered 2018-07-29: 5 [IU] via SUBCUTANEOUS
  Administered 2018-07-30 – 2018-08-01 (×3): 2 [IU] via SUBCUTANEOUS
  Administered 2018-08-01 – 2018-08-02 (×3): 1 [IU] via SUBCUTANEOUS
  Administered 2018-08-03: 3 [IU] via SUBCUTANEOUS
  Administered 2018-08-03 (×2): 2 [IU] via SUBCUTANEOUS
  Administered 2018-08-04: 3 [IU] via SUBCUTANEOUS
  Administered 2018-08-04: 5 [IU] via SUBCUTANEOUS
  Administered 2018-08-04: 2 [IU] via SUBCUTANEOUS
  Administered 2018-08-05: 9 [IU] via SUBCUTANEOUS
  Administered 2018-08-05: 5 [IU] via SUBCUTANEOUS
  Administered 2018-08-05 – 2018-08-06 (×3): 2 [IU] via SUBCUTANEOUS

## 2018-07-24 MED ORDER — ONDANSETRON HCL 4 MG PO TABS: 4.0000 mg | ORAL_TABLET | Freq: Four times a day (QID) | ORAL | Status: DC | PRN

## 2018-07-24 MED ORDER — LEVOFLOXACIN IN D5W 750 MG/150ML IV SOLN
750.0000 mg | Freq: Once | INTRAVENOUS | Status: AC
  Administered 2018-07-24: 750 mg via INTRAVENOUS
  Filled 2018-07-24: qty 150

## 2018-07-24 MED ORDER — ACETAMINOPHEN 325 MG PO TABS
650.0000 mg | ORAL_TABLET | Freq: Four times a day (QID) | ORAL | Status: DC | PRN
  Administered 2018-08-02: 650 mg via ORAL
  Filled 2018-07-24 (×3): qty 2

## 2018-07-24 MED ORDER — VANCOMYCIN 50 MG/ML ORAL SOLUTION
125.0000 mg | Freq: Four times a day (QID) | ORAL | Status: DC
  Administered 2018-07-25 – 2018-07-26 (×6): 125 mg via ORAL
  Filled 2018-07-24 (×16): qty 2.5

## 2018-07-24 MED ORDER — METRONIDAZOLE IN NACL 5-0.79 MG/ML-% IV SOLN
500.0000 mg | Freq: Once | INTRAVENOUS | Status: AC
  Administered 2018-07-24: 500 mg via INTRAVENOUS
  Filled 2018-07-24: qty 100

## 2018-07-24 MED ORDER — SODIUM CHLORIDE 0.9 % IV SOLN: Freq: Once | INTRAVENOUS | Status: DC

## 2018-07-24 MED ORDER — ACETAMINOPHEN 650 MG RE SUPP
650.0000 mg | Freq: Four times a day (QID) | RECTAL | Status: DC | PRN
  Administered 2018-07-25: 650 mg via RECTAL
  Filled 2018-07-24: qty 1

## 2018-07-24 MED ORDER — ENOXAPARIN SODIUM 30 MG/0.3ML ~~LOC~~ SOLN: 30.0000 mg | SUBCUTANEOUS | Status: DC

## 2018-07-24 MED ORDER — ATORVASTATIN CALCIUM 10 MG PO TABS
20.0000 mg | ORAL_TABLET | Freq: Every day | ORAL | Status: DC
  Administered 2018-07-25: 20 mg via ORAL
  Filled 2018-07-24 (×4): qty 2

## 2018-07-24 MED ORDER — SODIUM CHLORIDE 0.9 % IV SOLN
INTRAVENOUS | Status: DC
  Administered 2018-07-24: 11:00:00 via INTRAVENOUS

## 2018-07-24 MED ORDER — PANTOPRAZOLE SODIUM 40 MG PO TBEC: 40.0000 mg | DELAYED_RELEASE_TABLET | Freq: Every day | ORAL | Status: DC

## 2018-07-24 MED ORDER — METRONIDAZOLE IN NACL 5-0.79 MG/ML-% IV SOLN: 500.0000 mg | Freq: Three times a day (TID) | INTRAVENOUS | Status: DC

## 2018-07-24 NOTE — Plan of Care (Signed)
85 yo female with recent diverticulitis treated with antibiotics 2 weeks ago coming in with fever worsening abdominal pain nausea vomiting not able to keep anything down with fever 101 leukocytosis and extensive diverticulitis by CT.admit for ivf iv antibiotics and pain control.med surg inpatient,receive levo and falgyl in er.

## 2018-07-24 NOTE — ED Notes (Signed)
Pt on monitor 

## 2018-07-24 NOTE — ED Notes (Signed)
ED Provider at bedside. 

## 2018-07-24 NOTE — H&P (Addendum)
History and Physical    Shelby Walker VOZ:[id number] DOB: Mar 02, 1934 DOA: 07/24/2018  PCP: Reynold Bowen, MD  Patient coming from: Tx from [facility], from Home  I have personally briefly reviewed patient's old medical records in [place]  Chief Complaint: Nausea, vomiting, diarrhea  HPI: Shelby Walker is a 85 y.o. female with medical history significant of type 2 diabetes mellitus, hypertension, hyperlipidemia, GERD, rheumatoid arthritis who presents with complaints of nausea/vomiting/diarrhea.  Patient states originally symptoms started about 3 weeks ago.  Was seen at urgent care and diagnosed with infectious colitis and completed a 7-day course of ciprofloxacin and Flagyl without improvement of symptoms.  Diarrhea persisted with inability to tolerate solids or liquids which prompted reevaluation today at [facility].  Patient endorses roughly 8 stools per day.  Denies any blood in her stool.  No recent changes in her dietary habits, no medication changes or reported sick contacts.  Patient reports diffuse abdominal discomfort without significant localization.  Patient also reports fevers.  Patient denies headache, no chills/night sweats, no chest pain, no palpitations, no shortness of breath, no issues with bladder function, no paresthesias, no cough/congestion, no sore throat.  ED Course: Eval at [facility] [place] remarkable for temperature 100.5, HR 104, RR 16, BP 99/53, SPO2 93% on room air.  WBC count 15.3, hemoglobin 11.4, platelets 297, sodium 135, potassium 4.2, chloride 105, CO2 19, BUN 23, creatinine 1.22, glucose 155.  Calcium 7.4.  AST 14, ALT 11.  Lipase 23.  Urinalysis with small leukoesterase, negative nitrite.  At bedside COVID-19 test reported as negative.  Troponin less than 0.03.  Chest x-ray unremarkable.  Left hip x-ray negative for fracture.  CT abdomen/pelvis notable for diffuse colitis without perforation or abscess.  GI PCR and C. difficile colitis PCR pending.   Patient was referred for inpatient hospitalization secondary to failed outpatient treatment with oral antibiotics.  Review of Systems: As per HPI otherwise 10 point review of systems negative.   Past Medical History:  Diagnosis Date   Arthritis, rheumatoid ([place])    Bruit    Carotid Doppler showed no significant abnormality     9per patient)   Chest pain, unspecified    Nuclear, May, 2008, no scar or ischemia   Diabetes mellitus    Dyslipidemia    Ejection fraction    EF 55-60%, echo, February, 2011   GERD (gastroesophageal reflux disease)    Mitral regurgitation April 21, 2009   mild,  echo, February, 2011   Osteoporosis    Palpitations    possible very brief atrial fibrillation on monitor and possible reentrant tachycardia   Psoriasis    Rheumatoid arthritis ([place])     History reviewed. No pertinent surgical history.   reports that she has never smoked. She has never used smokeless tobacco. She reports that she does not drink alcohol or use drugs.  Allergies  Allergen Reactions   Compazine [Prochlorperazine Edisylate] Other (See Comments)    REACTION: " tongue swell and unable to swallow"   Sulfonamide Derivatives Other (See Comments)    REACTION: " broke out with fine itching bumps"    Family History  Problem Relation Age of Onset   Heart failure Father    Heart attack Father    Diabetes Father    Heart attack Mother    Heart failure Mother    Diabetes Mother    Stroke Sister      Prior to Admission medications   Medication Sig Start  Date End Date Taking? Authorizing Provider  aspirin 81 MG tablet Take 81 mg by mouth daily as needed for pain.    Yes [provider]  atorvastatin (LIPITOR) 20 MG tablet TAKE 1 TABLET BY MOUTH EVERY DAY 01/27/18  Yes End, Harrell Gave, MD  bisacodyl (DULCOLAX) 5 MG EC tablet Take 5 mg by mouth daily as needed for moderate constipation.   Yes [provider]  Calcium Carbonate-Vitamin D  (CALTRATE 600+D) 600-400 MG-UNIT per tablet Take 1 tablet by mouth daily.     Yes [provider]  Cholecalciferol 1000 units capsule Take 1,000 Units by mouth daily.    Yes [provider]  diltiazem (CARDIZEM) 60 MG tablet Take 60 mg by mouth at bedtime.   Yes [provider]  esomeprazole (NEXIUM) 40 MG capsule Take 40 mg by mouth daily at 12 noon.   Yes [provider]  etanercept (ENBREL) 50 MG/ML injection Inject 50 mg into the skin once a week.   Yes [provider]  insulin NPH Human (NOVOLIN N) 100 UNIT/ML injection Inject 20 Units into the skin at bedtime.   Yes [provider]  metoprolol succinate (TOPROL-XL) 25 MG 24 hr tablet Take 25 mg by mouth daily.     Yes [provider]  Multiple Vitamins-Minerals (CENTRUM SILVER ULTRA WOMENS) TABS Take by mouth. 1 tab daily   Yes [provider]  nitroGLYCERIN (NITROSTAT) 0.4 MG SL tablet 1 TABLET UNDER TONGUE AT ONSET OF CHEST PAIN YOU MAY REPEAT EVERY 5 MINUTES FOR UP TO 3 DOSES. 04/22/18  Yes Nahser, Wonda Cheng, MD  predniSONE (DELTASONE) 5 MG tablet Take 5 mg by mouth daily. 08/31/16  Yes [provider]  polyethylene glycol (MIRALAX / GLYCOLAX) 17 g packet Take 17 g by mouth daily.    [provider]  polyethylene glycol (MIRALAX) 17 g packet Take 17 g by mouth daily. 07/09/18   Fredia Sorrow, MD    Physical Exam: Vitals:   07/24/18 1800 07/24/18 1850 07/24/18 1946 07/24/18 1958  BP: (!) 126/54  (!) 99/53   Pulse: 99  (!) 104   Resp: 16  16   Temp:  99.4 F (37.4 C) (!) 100.5 F (38.1 C)   TempSrc:   Oral   SpO2: 96%  93%   Height:    5' 1.5" (1.562 m)    Constitutional: NAD, calm, comfortable Vitals:   07/24/18 1800 07/24/18 1850 07/24/18 1946 07/24/18 1958  BP: (!) 126/54  (!) 99/53   Pulse: 99  (!) 104   Resp: 16  16   Temp:  99.4 F (37.4 C) (!) 100.5 F (38.1 C)   TempSrc:   Oral   SpO2: 96%  93%   Height:    5' 1.5" (1.562 m)     Eyes: PERRL, lids and conjunctivae normal ENMT: Mucous membranes are dry. Posterior pharynx clear of any exudate or lesions.Normal dentition.  Neck: normal, supple, no masses, no thyromegaly Respiratory: clear to auscultation bilaterally, no wheezing, no crackles. Normal respiratory effort. No accessory muscle use.  Cardiovascular: Tachycardic, regular rhythm, no murmurs / rubs / gallops. No extremity edema. 2+ pedal pulses. No carotid bruits.  Abdomen: Mild to moderate diffuse tenderness to palpation globally, no masses palpated. No hepatosplenomegaly. Bowel sounds positive.  Musculoskeletal: no clubbing / cyanosis. No joint deformity upper and lower extremities. Good ROM, no contractures. Normal muscle tone.  Skin: no rashes, lesions, ulcers. No induration Neurologic: CN 2-12 grossly intact. Sensation intact, DTR normal.  Strength 5/5 in all 4.  Psychiatric: Normal judgment and insight. Alert and oriented x 3. Normal mood.     Labs on Admission: I have personally reviewed following labs and imaging studies  CBC: Recent Labs  Lab 07/24/18 1120  WBC 15.3*  NEUTROABS 12.6*  HGB 11.4*  HCT 36.4  MCV 92.9  PLT 626   Basic Metabolic Panel: Recent Labs  Lab 07/24/18 1120  NA 135  K 4.2  CL 105  CO2 19*  GLUCOSE 155*  BUN 23  CREATININE 1.22*  CALCIUM 7.4*   GFR: CrCl cannot be calculated (Unknown ideal weight.). Liver Function Tests: Recent Labs  Lab 07/24/18 1120  AST 14*  ALT 11  ALKPHOS 41  BILITOT 1.0  PROT 5.9*  ALBUMIN 2.9*   Recent Labs  Lab 07/24/18 1120  LIPASE 23   No results for input(s): AMMONIA in the last 168 hours. Coagulation Profile: No results for input(s): INR, PROTIME in the last 168 hours. Cardiac Enzymes: Recent Labs  Lab 07/24/18 1120  TROPONINI <0.03   BNP (last 3 results) No results for input(s): PROBNP in the last 8760 hours. HbA1C: No results for input(s): HGBA1C in the last 72 hours. CBG: No results for input(s): GLUCAP  in the last 168 hours. Lipid Profile: No results for input(s): CHOL, HDL, LDLCALC, TRIG, CHOLHDL, LDLDIRECT in the last 72 hours. Thyroid Function Tests: No results for input(s): TSH, T4TOTAL, FREET4, T3FREE, THYROIDAB in the last 72 hours. Anemia Panel: No results for input(s): VITAMINB12, FOLATE, FERRITIN, TIBC, IRON, RETICCTPCT in the last 72 hours. Urine analysis:    Component Value Date/Time   COLORURINE YELLOW 07/24/2018 1640   APPEARANCEUR HAZY (A) 07/24/2018 1640   LABSPEC 1.010 07/24/2018 1640   PHURINE 5.5 07/24/2018 1640   GLUCOSEU NEGATIVE 07/24/2018 1640   HGBUR SMALL (A) 07/24/2018 1640   BILIRUBINUR NEGATIVE 07/24/2018 1640   KETONESUR 40 (A) 07/24/2018 1640   PROTEINUR NEGATIVE 07/24/2018 1640   NITRITE NEGATIVE 07/24/2018 1640   LEUKOCYTESUR SMALL (A) 07/24/2018 1640    Radiological Exams on Admission: Ct Abdomen Pelvis W Contrast  Result Date: 07/24/2018 CLINICAL DATA:  Fevers abdominal pain EXAM: CT ABDOMEN AND PELVIS WITH CONTRAST TECHNIQUE: Multidetector CT imaging of the abdomen and pelvis was performed using the standard protocol following bolus administration of intravenous contrast. CONTRAST:  160mL OMNIPAQUE IOHEXOL 300 MG/ML  SOLN COMPARISON:  07/09/2018 FINDINGS: Lower chest: No acute abnormality. Hepatobiliary: No focal liver abnormality is seen. No gallstones, gallbladder wall thickening, or biliary dilatation. Pancreas: Unremarkable. No pancreatic ductal dilatation or surrounding inflammatory changes. Spleen: Normal in size without focal abnormality. Adrenals/Urinary Tract: Adrenal glands are within normal limits bilaterally. Kidneys demonstrate no renal calculi or urinary tract obstructive changes. Stable renal cysts are noted bilaterally. Bladder is well distended. Stomach/Bowel: Colon demonstrates diffuse diverticular change throughout the sigmoid colon. Mucosal hyperemia and mild pericolonic inflammatory changes are now seen diffusely throughout the colon  increased from the prior exam consistent with a more generalized likely infectious colitis. Small bowel and stomach are within normal limits with the exception of a small sliding-type hiatal hernia. No free air is seen. Vascular/Lymphatic: Aortic atherosclerosis. No enlarged abdominal or pelvic lymph nodes. Reproductive: Uterus and bilateral adnexa are unremarkable. Other: No abdominal wall hernia or abnormality. No abdominopelvic ascites. Musculoskeletal: Degenerative changes of lumbar spine are noted. IMPRESSION: Changes consistent with diffuse colitis without perforation or abscess formation. This has advanced significantly when compared with the prior exam. Electronically Signed   By: Inez Catalina  M.D.   On: 07/24/2018 15:29   Dg Chest Portable 1 View  Result Date: 07/24/2018 CLINICAL DATA:  Chest pain EXAM: PORTABLE CHEST 1 VIEW COMPARISON:  01/20/2007 FINDINGS: Cardiac shadow is within normal limits. The lungs are well aerated bilaterally. No focal infiltrate or sizable effusion is seen. No acute bony abnormality is noted. IMPRESSION: No active disease. Electronically Signed   By: Inez Catalina M.D.   On: 07/24/2018 11:52   Dg Hip Unilat W Or Wo Pelvis 2-3 Views Left  Result Date: 07/24/2018 CLINICAL DATA:  Left hip pain after fall 4 days ago. EXAM: DG HIP (WITH OR WITHOUT PELVIS) 2-3V LEFT COMPARISON:  None. FINDINGS: There is no evidence of hip fracture or dislocation. There is no evidence of arthropathy or other focal bone abnormality. IMPRESSION: Negative. Electronically Signed   By: Marijo Conception M.D.   On: 07/24/2018 11:55     Assessment/Plan Principal Problem:   Diverticulitis Active Problems:   Rheumatoid arthritis ([place])   Diabetes mellitus ([place])   GERD (gastroesophageal reflux disease)   Colitis presumed infectious   Colitis  Addendum 10:56 PM C. difficile colitis C. difficile PCR returned back positive.  Discontinued Flagyl and Levaquin.  Started oral  vancomycin.   Infectious colitis Diverticulitis Failed outpatient oral antibiotic therapy Patient presenting as a transfer from [place] with persistent nausea/vomiting/diarrhea consistent with infectious colitis.  Failed outpatient antibiotic therapy with ciprofloxacin and Flagyl.  Continues with diffuse diarrhea reported as 8 times per day.  Patient is febrile to 100.5 tachycardic with a heart rate of 104 and with an elevated white blood cell count of 15.3.  CT abdomen/pelvis consistent with diffuse colitis without perforation or abscess.  Lipase within normal limits. --Admit inpatient, MedSurg --Continue IV fluid hydration with normal saline at 100 mL's per hour --IV antibiotics with levofloxacin and Flagyl --C. difficile PCR and GI PCR panel pending --Enteric precautions --Clear liquid diet --Repeat CMP, CBC in the a.m. --Supportive care, antiemetics, antipyretics, pain control  Type 2 diabetes mellitus Patient on NPH 20u QHS.  No hemoglobin A1c available for review in EMR.  Glucose 155 on admission. --Hold home NPH --Insulin sliding scale for coverage --Check hemoglobin A1c  Essential hypertension Patient on metoprolol succinate 25 mg p.o. daily, diltiazem 60 mg p.o. daily. --continue with hold parameters  Hyperlipidemia: Continue statin  GERD On Nexium 40 mg p.o. daily at home, will continue with hospital substitution with Protonix  Rheumatoid arthritis Patient on Enbrel 50 mg weekly and prednisone 5 mg p.o. daily. --Continue home prednisone   DVT prophylaxis: Lovenox Code Status: Full code Family Communication: None Disposition Plan: Home when medically stable Consults called: None Admission status: Inpatient, MedSurg   Dewitt Judice J [place] DO [facility] Pager [phone]  If 7PM-7AM, please contact night-coverage [email or web address] Password [facility]  07/24/2018, 8:47 PM

## 2018-07-24 NOTE — ED Provider Notes (Signed)
Cont abdo pain after tx for diverticulitis. Fever yesterday. CP yesterday evening. CT abdo pelvis pending. Stool studies if possible for cdiff. Physical Exam  BP (!) 132/48 (BP Location: Right Arm) Comment: two BP taken  Pulse (!) 101   Temp 99.9 F (37.7 C) (Oral)   Resp 20   Ht 5' 1.5" (1.562 m)   SpO2 98%   BMI 24.54 kg/m   Physical Exam  ED Course/Procedures   Clinical Course as of Jul 23 1498  Thu Jul 24, 2018  1250 Labs notable for an elevated white blood cell count.   [JK]  1250 Low protein and calcium levels are persistent.  Considering her recent diagnosis of diverticulitis and worsening symptoms we will proceed with repeat CT scan to assess for possibility of complication such as abscess or perforation   [JK]    Clinical Course User Index [JK] Dorie Rank, MD    Procedures  MDM  Consult: Dr. Zigmund Daniel excepts for admission to [facility] long hospital  Patient CT shows more extensive diverticulitis.  She has leukocytosis.  Patient has severe generalized weakness today that nearly prohibit her from be able to get up and transport with her daughter.  Will admit for IV antibiotics, additional evaluation for possible C. difficile, rehydration      Charlesetta Shanks, MD 07/24/18 1622

## 2018-07-24 NOTE — ED Notes (Signed)
Report given to Jocelyn Lamer, RN at [facility].  Daughter, [name], updated on patient's room assignment and informed her that I spoke with the nurse at [facility] for her to stay with her mom, negative.

## 2018-07-24 NOTE — ED Triage Notes (Signed)
Fever of 101 last night, denies coughing.  Abdominal cramping started this Saturday, May 6th.  Has a history of diverticulitis.  [place] this past Sunday and was seen by PCP.  CT was done, negative.

## 2018-07-24 NOTE — Progress Notes (Signed)
PHARMACY NOTE:  ANTIMICROBIAL RENAL DOSAGE ADJUSTMENT  Current antimicrobial regimen includes a mismatch between antimicrobial dosage and estimated renal function.  As per policy approved by the Pharmacy & Therapeutics and Medical Executive Committees, the antimicrobial dosage will be adjusted accordingly.  Current antimicrobial dosage: Levaquin 750mg  IV q24h  Indication: IAI  Renal Function: CrCl ~ 56ml/min  CrCl cannot be calculated (Unknown ideal weight.). []      On intermittent HD, scheduled: []      On CRRT    Antimicrobial dosage has been changed to: Levaquin 750mg  IV q48h for CrCl 20-59ml/min per manufacturer   Additional comments:  Thank you for allowing pharmacy to be a part of this patient's care.  Biagio Borg, [facility] 07/24/2018 8:46 PM

## 2018-07-24 NOTE — ED Provider Notes (Signed)
[facility] EMERGENCY DEPARTMENT Provider Note   CSN: [id number] Arrival date & time: 07/24/18  1038    History   Chief Complaint Chief Complaint  Patient presents with  . Abdominal Cramps    HPI Shelby Walker is a 85 y.o. female.     HPI Patient states she has been having trouble with abdominal pain and diarrhea for the last few weeks.  Patient was seen in the emergency room back on May 6 for the symptoms.  She ended up having a CT scan that demonstrated diverticulitis.  Patient was started on antibiotics and was discharged.  Patient states she might of gotten slightly better at one point but really has never recovered significantly.  She continues to have diarrhea and loose stools.  She has nausea but no vomiting.  She is also having abdominal discomfort primarily in the lower abdomen but more diffusely as well.  It is a cramping pressure bloating pain.  Last evening however her symptoms got worse.  She also started having chest discomfort that lasted for several hours.  She had to take her home nitroglycerin.  Those symptoms have all resolved and she is not having any pain right now.  Her daughter took her temperature last night and it was up to 101.  She was going to see her doctor today but she felt too weak and did not feel like she could make it to the appointment so her daughter brought her to the emergency room. Past Medical History:  Diagnosis Date  . Arthritis, rheumatoid ([place])   . Bruit    Carotid Doppler showed no significant abnormality     9per patient)  . Chest pain, unspecified    Nuclear, May, 2008, no scar or ischemia  . Diabetes mellitus   . Dyslipidemia   . Ejection fraction    EF 55-60%, echo, February, 2011  . GERD (gastroesophageal reflux disease)   . Mitral regurgitation April 21, 2009   mild,  echo, February, 2011  . Osteoporosis   . Palpitations    possible very brief atrial fibrillation on monitor and possible reentrant tachycardia  .  Psoriasis   . Rheumatoid arthritis [facility])     Patient Active Problem List   Diagnosis Date Noted  . Mixed hyperlipidemia 09/29/2016  . Stable angina ([place]) 09/29/2016  . Claudication in peripheral vascular disease ([place]) 08/09/2016  . Preoperative clearance 05/21/2012  . Precordial chest pain   . Palpitations   . Diabetes mellitus   . GERD (gastroesophageal reflux disease)   . Ejection fraction   . Carotid artery disease ([place])   . Mitral regurgitation 04/21/2009  . PSORIASIS 02/09/2009  . RHEUMATOID ARTHRITIS 02/09/2009    History reviewed. No pertinent surgical history.   OB History    Gravida  2   Para  2   Term      Preterm      AB      Living        SAB      TAB      Ectopic      Multiple      Live Births               Home Medications    Prior to Admission medications   Medication Sig Start Date End Date Taking? Authorizing Provider  aspirin 81 MG tablet Take 81 mg by mouth daily as needed for pain.     [provider]  atorvastatin (LIPITOR) 20 MG tablet  TAKE 1 TABLET BY MOUTH EVERY DAY 01/27/18   End, Harrell Gave, MD  bisacodyl (DULCOLAX) 5 MG EC tablet Take 5 mg by mouth daily as needed for moderate constipation.    [provider]  Calcium Carbonate-Vitamin D (CALTRATE 600+D) 600-400 MG-UNIT per tablet Take 1 tablet by mouth daily.      [provider]  Cholecalciferol 1000 units capsule Take 1,000 Units by mouth daily.     [provider]  ciprofloxacin (CIPRO) 500 MG tablet Take 1 tablet (500 mg total) by mouth 2 (two) times daily. 07/09/18   Fredia Sorrow, MD  esomeprazole (NEXIUM) 40 MG capsule Take 40 mg by mouth daily at 12 noon.    [provider]  etanercept (ENBREL) 50 MG/ML injection Inject 50 mg into the skin once a week.    [provider]  HUMALOG KWIKPEN 100 UNIT/ML KiwkPen Inject 10 Units into the skin 2 (two) times daily with a meal. 09/04/17   [provider]  Insulin  Glargine (BASAGLAR KWIKPEN) 100 UNIT/ML SOPN Inject 20 Units into the skin at bedtime. 10/04/17   [provider]  metFORMIN (GLUCOPHAGE) 500 MG tablet Take 500 mg by mouth daily with breakfast.  09/22/17   [provider]  metoprolol succinate (TOPROL-XL) 25 MG 24 hr tablet Take 25 mg by mouth daily.      [provider]  Multiple Vitamins-Minerals (CENTRUM SILVER ULTRA WOMENS) TABS Take by mouth. 1 tab daily    [provider]  nitroGLYCERIN (NITROSTAT) 0.4 MG SL tablet 1 TABLET UNDER TONGUE AT ONSET OF CHEST PAIN YOU MAY REPEAT EVERY 5 MINUTES FOR UP TO 3 DOSES. 04/22/18   Nahser, Wonda Cheng, MD  polyethylene glycol (MIRALAX / GLYCOLAX) 17 g packet Take 17 g by mouth daily.    [provider]  polyethylene glycol (MIRALAX) 17 g packet Take 17 g by mouth daily. 07/09/18   Fredia Sorrow, MD  predniSONE (DELTASONE) 5 MG tablet Take 5 mg by mouth daily. 08/31/16   [provider]    Family History Family History  Problem Relation Age of Onset  . Heart failure Father   . Heart attack Father   . Diabetes Father   . Heart attack Mother   . Heart failure Mother   . Diabetes Mother   . Stroke Sister     Social History Social History   Tobacco Use  . Smoking status: Never Smoker  . Smokeless tobacco: Never Used  Substance Use Topics  . Alcohol use: No    Alcohol/week: 0.0 standard drinks  . Drug use: No     Allergies   Compazine [prochlorperazine edisylate] and Sulfonamide derivatives   Review of Systems Review of Systems  All other systems reviewed and are negative.    Physical Exam Updated Vital Signs BP (!) 132/48 (BP Location: Right Arm) Comment: two BP taken  Pulse (!) 101   Temp 99.9 F (37.7 C) (Oral)   Resp 20   Ht 1.562 m (5' 1.5")   SpO2 98%   BMI 24.54 kg/m   Physical Exam Vitals signs and nursing note reviewed.  Constitutional:      Appearance: She is well-developed. She is ill-appearing.  HENT:      Head: Normocephalic and atraumatic.     Right Ear: External ear normal.     Left Ear: External ear normal.  Eyes:     General: No scleral icterus.       Right eye: No discharge.  Left eye: No discharge.     Conjunctiva/sclera: Conjunctivae normal.  Neck:     Musculoskeletal: Neck supple.     Trachea: No tracheal deviation.  Cardiovascular:     Rate and Rhythm: Normal rate and regular rhythm.  Pulmonary:     Effort: Pulmonary effort is normal. No respiratory distress.     Breath sounds: Normal breath sounds. No stridor. No wheezing or rales.  Abdominal:     General: Bowel sounds are normal. There is no distension.     Palpations: Abdomen is soft.     Tenderness: There is abdominal tenderness. There is no guarding or rebound.     Comments: Mild tenderness in the lower abdomen bilaterally, no rebound or guarding, no masses appreciated  Musculoskeletal:        General: No tenderness.  Skin:    General: Skin is warm and dry.     Findings: No rash.  Neurological:     Mental Status: She is alert.     Cranial Nerves: No cranial nerve deficit (no facial droop, extraocular movements intact, no slurred speech).     Sensory: No sensory deficit.     Motor: No abnormal muscle tone or seizure activity.     Coordination: Coordination normal.      ED Treatments / Results  Labs (all labs ordered are listed, but only abnormal results are displayed) Labs Reviewed  COMPREHENSIVE METABOLIC PANEL  LIPASE, BLOOD  CBC WITH DIFFERENTIAL/PLATELET  URINALYSIS, ROUTINE W REFLEX MICROSCOPIC  TROPONIN I    EKG None  Radiology Ct Head Wo Contrast  Result Date: 07/22/2018 CLINICAL DATA:  Status post fall Sunday, occipital pain EXAM: CT HEAD WITHOUT CONTRAST TECHNIQUE: Contiguous axial images were obtained from the base of the skull through the vertex without intravenous contrast. COMPARISON:  None. FINDINGS: Brain: No evidence of acute infarction, hemorrhage, extra-axial collection,  ventriculomegaly, or mass effect. There is a 11 mm hyperdense right anterior parafalcine mass likely reflecting a small meningioma. Generalized cerebral atrophy. Periventricular white matter low attenuation likely secondary to microangiopathy. Vascular: Cerebrovascular atherosclerotic calcifications are noted. Skull: Negative for fracture or focal lesion. Sinuses/Orbits: Visualized portions of the orbits are unremarkable. Visualized portions of the paranasal sinuses and mastoid air cells are unremarkable. Other: None. IMPRESSION: No acute intracranial pathology. Electronically Signed   By: Kathreen Devoid   On: 07/22/2018 15:32    Procedures Procedures (including critical care time)  Medications Ordered in ED Medications  sodium chloride 0.9 % bolus 500 mL (has no administration in time range)    And  0.9 %  sodium chloride infusion (has no administration in time range)  ondansetron (ZOFRAN) injection 4 mg (has no administration in time range)  morphine 4 MG/ML injection 4 mg (has no administration in time range)     Initial Impression / Assessment and Plan / ED Course  I have reviewed the triage vital signs and the nursing notes.  Pertinent labs & imaging results that were available during my care of the patient were reviewed by me and considered in my medical decision making (see chart for details).  Clinical Course as of Jul 27 802  Thu Jul 24, 2018  1250 Labs notable for an elevated white blood cell count.   [JK]  1250 Low protein and calcium levels are persistent.  Considering her recent diagnosis of diverticulitis and worsening symptoms we will proceed with repeat CT scan to assess for possibility of complication such as abscess or perforation   [JK]  Clinical Course User Index [JK] Dorie Rank, MD   Pt with persistent sx after recent tx for diverticulitis.  CT scan ordered for further evaluation.  Care turned over to Dr Johnney Killian at change of shift.  Final Clinical  Impressions(s) / ED Diagnoses  pending   Dorie Rank, MD 07/27/18 [id number]

## 2018-07-25 LAB — GASTROINTESTINAL PANEL BY PCR, STOOL (REPLACES STOOL CULTURE)

## 2018-07-25 LAB — COMPREHENSIVE METABOLIC PANEL
ALT: 11 U/L (ref 0–44)
AST: 13 U/L — ABNORMAL LOW (ref 15–41)
Albumin: 2.2 g/dL — ABNORMAL LOW (ref 3.5–5.0)
Alkaline Phosphatase: 35 U/L — ABNORMAL LOW (ref 38–126)
Anion gap: 12 (ref 5–15)
BUN: 27 mg/dL — ABNORMAL HIGH (ref 8–23)
CO2: 13 mmol/L — ABNORMAL LOW (ref 22–32)
Calcium: 6.4 mg/dL — CL (ref 8.9–10.3)
Chloride: 111 mmol/L (ref 98–111)
Creatinine, Ser: 1.44 mg/dL — ABNORMAL HIGH (ref 0.44–1.00)
GFR calc Af Amer: 39 mL/min — ABNORMAL LOW (ref >60)
GFR calc non Af Amer: 33 mL/min — ABNORMAL LOW (ref >60)
Glucose, Bld: 133 mg/dL — ABNORMAL HIGH (ref 70–99)
Potassium: 4.2 mmol/L (ref 3.5–5.1)
Sodium: 136 mmol/L (ref 135–145)
Total Bilirubin: 1.2 mg/dL (ref 0.3–1.2)
Total Protein: 4.9 g/dL — ABNORMAL LOW (ref 6.5–8.1)

## 2018-07-25 LAB — GLUCOSE, CAPILLARY
Glucose-Capillary: 141 mg/dL — ABNORMAL HIGH (ref 70–99)
Glucose-Capillary: 163 mg/dL — ABNORMAL HIGH (ref 70–99)
Glucose-Capillary: 149 mg/dL — ABNORMAL HIGH (ref 70–99)
Glucose-Capillary: 169 mg/dL — ABNORMAL HIGH (ref 70–99)

## 2018-07-25 LAB — CBC
HCT: 36.8 % (ref 36.0–46.0)
Hemoglobin: 11.6 g/dL — ABNORMAL LOW (ref 12.0–15.0)
MCH: 29.4 pg (ref 26.0–34.0)
MCHC: 31.5 g/dL (ref 30.0–36.0)
MCV: 93.2 fL (ref 80.0–100.0)
Platelets: 240 10*3/uL (ref 150–400)
RBC: 3.95 MIL/uL (ref 3.87–5.11)
RDW: 14.3 % (ref 11.5–15.5)
WBC: 13.9 10*3/uL — ABNORMAL HIGH (ref 4.0–10.5)
nRBC: 0 % (ref 0.0–0.2)

## 2018-07-25 LAB — TROPONIN I: Troponin I: <0.03 ng/mL (ref <0.03)

## 2018-07-25 LAB — MRSA PCR SCREENING: MRSA by PCR: NEGATIVE

## 2018-07-25 LAB — LACTIC ACID, PLASMA: Lactic Acid, Venous: 1 mmol/L (ref 0.5–1.9)

## 2018-07-25 MED ORDER — GERHARDT'S BUTT CREAM
TOPICAL_CREAM | Freq: Two times a day (BID) | CUTANEOUS | Status: DC
  Administered 2018-07-25 – 2018-07-28 (×8): via TOPICAL
  Administered 2018-07-29: 1 via TOPICAL
  Administered 2018-07-29 – 2018-08-01 (×6): via TOPICAL
  Administered 2018-08-01: 1 via TOPICAL
  Administered 2018-08-02 – 2018-08-06 (×9): via TOPICAL
  Filled 2018-07-25 (×2): qty 1

## 2018-07-25 MED ORDER — CALCIUM GLUCONATE-NACL 1-0.675 GM/50ML-% IV SOLN
1.0000 g | Freq: Once | INTRAVENOUS | Status: AC
  Administered 2018-07-25: 1000 mg via INTRAVENOUS
  Filled 2018-07-25: qty 50

## 2018-07-25 MED ORDER — SODIUM CHLORIDE 0.9 % IV SOLN: 1.0000 g | Freq: Once | INTRAVENOUS | Status: DC

## 2018-07-25 MED ORDER — ALUM & MAG HYDROXIDE-SIMETH 200-200-20 MG/5ML PO SUSP
30.0000 mL | Freq: Four times a day (QID) | ORAL | Status: DC | PRN
  Administered 2018-07-25: 21:00:00 30 mL via ORAL
  Filled 2018-07-25: qty 30

## 2018-07-25 MED ORDER — DILTIAZEM HCL 30 MG PO TABS: 30.0000 mg | ORAL_TABLET | Freq: Every day | ORAL | Status: DC

## 2018-07-25 MED ORDER — METRONIDAZOLE IN NACL 5-0.79 MG/ML-% IV SOLN
500.0000 mg | Freq: Three times a day (TID) | INTRAVENOUS | Status: DC
  Administered 2018-07-25 – 2018-07-26 (×3): 500 mg via INTRAVENOUS
  Filled 2018-07-25 (×3): qty 100

## 2018-07-25 MED ORDER — CALCIUM GLUCONATE-NACL 1-0.675 GM/50ML-% IV SOLN
1.0000 g | Freq: Once | INTRAVENOUS | Status: DC
  Administered 2018-07-25: 1000 mg via INTRAVENOUS

## 2018-07-25 MED ORDER — MORPHINE SULFATE (PF) 2 MG/ML IV SOLN
2.0000 mg | INTRAVENOUS | Status: DC | PRN
  Administered 2018-07-25 – 2018-08-03 (×14): 2 mg via INTRAVENOUS
  Filled 2018-07-25 (×15): qty 1

## 2018-07-25 MED ORDER — NYSTATIN 100000 UNIT/GM EX POWD
Freq: Two times a day (BID) | CUTANEOUS | Status: DC
  Administered 2018-07-25 – 2018-08-06 (×25): via TOPICAL
  Filled 2018-07-25 (×2): qty 15

## 2018-07-25 NOTE — Progress Notes (Signed)
Pharmacy: LMWH dose adjustment  LMWH 40> 30 for CrCl < 30  Eudelia Bunch, Pharm.D 07/25/2018 10:47 AM

## 2018-07-25 NOTE — Progress Notes (Addendum)
Pt HAS A LOW GRADE TEMP OF 99.99.7. she is purse breathing at times. C/O of tightness in her chest ,mid chest and radiates to her left side. Resp are 22-36. BP is WNL. Heart rate is elevated and she stated"I feel like my heart is beating fast. I sent a page to Adhikri. Order received. I am giving her morphine 2 mg iv .  1730 Report given to Raver n in stepdown.   1800 PT transported to 1232.

## 2018-07-25 NOTE — Progress Notes (Signed)
PROGRESS NOTE    Shelby Walker  GBT:[id number] DOB: Feb 25, 1934 DOA: 07/24/2018 PCP: Reynold Bowen, MD   Brief Narrative: Patient is 85 year old female with history of diabetes type 2, hypertension, hyperlipidemia, GERD,rheumatoid arthritis who presents to the emergency department with complaints of nausea, vomiting and diarrhea.  Her symptoms started 3 weeks ago.  Patient was seen at urgent care and was diagnosed with infectious colitis and she completed a 7 days course of ciprofloxacin and Flagyl without improvement of symptoms.  Diarrhea persisted so she presented to the med [facility].  She complained of about 8-10 episodes of loose stools, abdominal pain.  When she presented she had mild fever, tachycardia, hypotension.  White cell counts was elevated.  Had mild acute kidney injury.  CT abdomen/pelvis showed diffuse colitis without perforation or abscess.  C. difficile came out to be positive.  Started on PO vancomycin.  Added Flagyl today.  Assessment & Plan:   Principal Problem:   Clostridium difficile colitis Active Problems:   Rheumatoid arthritis ([place])   Diabetes mellitus ([place])   GERD (gastroesophageal reflux disease)   Diverticulitis   Colitis presumed infectious   Colitis  C. difficile colitis: CT abdomen/pelvis as above.  Will take this as severe C. difficile colitis.  She had leukocytosis and acute kidney injury on presentation.  She was started on oral vancomycin.  We will add Flagyl. Continue serial abdominal examination.  Continue contact precaution. She does not give any history of C. difficile in the past.  She was taking doxycycline in March for her tooth problem.  She presented on 07/09/2018 at [facility] with abdominal pain.CT abdomen/pelvis was done which showed diverticulosis/diverticulitis.  She was discharged on Flagyl and Cipro. Continue clear liquid diet.  Diabetes mellitus: Continue sliding-scale insulin for now.  On insulin at home.  Hemoglobin  A1c of 8.8.  Hypertension: Currently blood pressure stable, soft.  We will hold her blood pressure medications.  Hyperlipidemia: Continue statin  GERD: Continue PPI  Rheumatoid arthritis: On Enbrel and prednisone at home which are on hold.  H/O divertuls  Debility/deconditioning: Physical therapy consulted.             DVT prophylaxis: Lovenox Code Status: Full Family Communication: Discussed with daughter on phone Disposition Plan: Undetermined at this point   Consultants: None  Procedures: None  Antimicrobials:  Anti-infectives (From admission, onward)   Start     Dose/Rate Route Frequency Ordered Stop   07/26/18 1800  levofloxacin (LEVAQUIN) IVPB 750 mg  Status:  Discontinued     750 mg 100 mL/hr over 90 Minutes Intravenous Every 48 hours 07/24/18 2044 07/24/18 2255   07/25/18 1130  metroNIDAZOLE (FLAGYL) IVPB 500 mg     500 mg 100 mL/hr over 60 Minutes Intravenous Every 8 hours 07/25/18 1122     07/24/18 2359  metroNIDAZOLE (FLAGYL) IVPB 500 mg  Status:  Discontinued     500 mg 100 mL/hr over 60 Minutes Intravenous Every 8 hours 07/24/18 2041 07/24/18 2255   07/24/18 2245  vancomycin (VANCOCIN) 50 mg/mL oral solution 125 mg     125 mg Oral 4 times daily 07/24/18 2232 08/03/18 2159   07/24/18 2045  levofloxacin (LEVAQUIN) IVPB 750 mg  Status:  Discontinued     750 mg 100 mL/hr over 90 Minutes Intravenous Every 24 hours 07/24/18 2041 07/24/18 2044   07/24/18 1600  levofloxacin (LEVAQUIN) IVPB 750 mg     750 mg 100 mL/hr over 90 Minutes Intravenous  Once 07/24/18  1551 07/24/18 1926   07/24/18 1600  metroNIDAZOLE (FLAGYL) IVPB 500 mg     500 mg 100 mL/hr over 60 Minutes Intravenous  Once 07/24/18 1551 07/24/18 1755      Subjective: Patient seen and examined the bedside this morning.  Remains weak.  Had low-grade fever this morning.  Complains of abdominal pain, difficulty urination.  Has been having loose stools.  Objective: Vitals:   07/25/18 0300  07/25/18 0332 07/25/18 0440 07/25/18 0650  BP:  (!) 139/53  (!) 127/54  Pulse:  (!) 109  (!) 106  Resp:      Temp: (!) 102.5 F (39.2 C)  (!) 101.5 F (38.6 C) 98.7 F (37.1 C)  TempSrc: Oral  Oral Oral  SpO2:  95%  97%  Weight:      Height:        Intake/Output Summary (Last 24 hours) at 07/25/2018 1123 Last data filed at 07/25/2018 0645 Gross per 24 hour  Intake 1848.39 ml  Output --  Net 1848.39 ml   Filed Weights   07/24/18 1958  Weight: 57.2 kg    Examination:  General exam: Debilitated elderly female, weak  HEENT:PERRL,Oral mucosa dry, Ear/Nose normal on gross exam Respiratory system: Bilateral equal air entry, normal vesicular breath sounds, no wheezes or crackles  Cardiovascular system: S1 & S2 heard, RRR. No JVD, murmurs, rubs, gallops or clicks. No pedal edema. Gastrointestinal system: Abdomen is distended, soft and has generalized tenderness. No organomegaly or masses felt. Normal bowel sounds heard. Central nervous system: Alert and oriented. No focal neurological deficits. Extremities: No edema, no clubbing ,no cyanosis, distal peripheral pulses palpable. Skin: No rashes, lesions or ulcers,no icterus ,no pallor. Chronic venous stasis changes on bilateral lower extremities    Data Reviewed: I have personally reviewed following labs and imaging studies  CBC: Recent Labs  Lab 07/24/18 1120 07/25/18 0614  WBC 15.3* 13.9*  NEUTROABS 12.6*  --   HGB 11.4* 11.6*  HCT 36.4 36.8  MCV 92.9 93.2  PLT 247 510   Basic Metabolic Panel: Recent Labs  Lab 07/24/18 1120 07/24/18 2339 07/25/18 0614  NA 135  --  136  K 4.2  --  4.2  CL 105  --  111  CO2 19*  --  13*  GLUCOSE 155*  --  133*  BUN 23  --  27*  CREATININE 1.22*  --  1.44*  CALCIUM 7.4*  --  6.4*  MG  --  1.8  --    GFR: Estimated Creatinine Clearance: 22.9 mL/min (A) (by C-G formula based on SCr of 1.44 mg/dL (H)). Liver Function Tests: Recent Labs  Lab 07/24/18 1120 07/25/18 0614  AST  14* 13*  ALT 11 11  ALKPHOS 41 35*  BILITOT 1.0 1.2  PROT 5.9* 4.9*  ALBUMIN 2.9* 2.2*   Recent Labs  Lab 07/24/18 1120  LIPASE 23   No results for input(s): AMMONIA in the last 168 hours. Coagulation Profile: No results for input(s): INR, PROTIME in the last 168 hours. Cardiac Enzymes: Recent Labs  Lab 07/24/18 1120  TROPONINI <0.03   BNP (last 3 results) No results for input(s): PROBNP in the last 8760 hours. HbA1C: Recent Labs    07/24/18 2111  HGBA1C 8.8*   CBG: Recent Labs  Lab 07/24/18 2147 07/25/18 0734  GLUCAP 123* 141*   Lipid Profile: No results for input(s): CHOL, HDL, LDLCALC, TRIG, CHOLHDL, LDLDIRECT in the last 72 hours. Thyroid Function Tests: Recent Labs    07/24/18 2111  TSH 1.562   Anemia Panel: No results for input(s): VITAMINB12, FOLATE, FERRITIN, TIBC, IRON, RETICCTPCT in the last 72 hours. Sepsis Labs: Recent Labs  Lab 07/24/18 2111 07/24/18 2339  LATICACIDVEN 1.4 1.0    Recent Results (from the past 240 hour(s))  SARS Coronavirus 2 (Hosp order,Performed in [facility] lab via [employer])     Status: None   Collection Time: 07/24/18  4:40 PM  Result Value Ref Range Status   SARS Coronavirus 2 ([employer] Now) NEGATIVE NEGATIVE Final    Comment: (NOTE) Interpretive Result Comment(s): COVID 19 Positive SARS CoV 2 target nucleic acids are DETECTED. The SARS CoV 2 RNA is generally detectable in upper and lower respiratory specimens during the acute phase of infection.  Positive results are indicative of active infection with SARS CoV 2.  Clinical correlation with patient history and other diagnostic information is necessary to determine patient infection status.  Positive results do not rule out bacterial infection or coinfection with other viruses. The expected result is Negative. COVID 19 Negative SARS CoV 2 target nucleic acids are NOT DETECTED. The SARS CoV 2 RNA is generally detectable in upper and lower respiratory  specimens during the acute phase of infection.  Negative results do not preclude SARS CoV 2 infection, do not rule out coinfections with other pathogens, and should not be used as the sole basis for treatment or other patient management decisions.  Negative results must be combined with clinical  observations, patient history, and epidemiological information. The expected result is Negative. Invalid Presence or absence of SARS CoV 2 nucleic acids cannot be determined. Repeat testing was performed on the submitted specimen and repeated Invalid results were obtained.  If clinically indicated, additional testing on a new specimen with an alternate test methodology [phone]) is advised.  The SARS CoV 2 RNA is generally detectable in upper and lower respiratory specimens during the acute phase of infection. The expected result is Negative. Fact Sheet for Patients:  GolfingFamily.no Fact Sheet for Healthcare Providers: https://www.hernandez-brewer.com/ This test is not yet approved or cleared by the [place] FDA and has been authorized for detection and/or diagnosis of SARS CoV 2 by FDA under an Emergency Use Authorization (EUA).  This EUA will remain in effect (meaning this test can be used) for the duration of the COVID19 d eclaration under Section 564(b)(1) of the Act, 21 U.S.C. section [phone] 3(b)(1), unless the authorization is terminated or revoked sooner. Performed at [facility], [address] [place], [place] [address]   C difficile quick scan w PCR reflex     Status: Abnormal   Collection Time: 07/24/18  6:50 PM  Result Value Ref Range Status   C Diff antigen POSITIVE (A) NEGATIVE Final   C Diff toxin POSITIVE (A) NEGATIVE Final   C Diff interpretation Toxin producing C. difficile detected.  Final    Comment: CRITICAL RESULT CALLED TO, READ BACK BY AND VERIFIED WITH: Harlen Labs RN 07/24/18 2224 JDW Performed at [facility], [place] [address] [place], [place] [address]          Radiology Studies: Ct Abdomen Pelvis W Contrast  Result Date: 07/24/2018 CLINICAL DATA:  Fevers abdominal pain EXAM: CT ABDOMEN AND PELVIS WITH CONTRAST TECHNIQUE: Multidetector CT imaging of the abdomen and pelvis was performed using the standard protocol following bolus administration of intravenous contrast. CONTRAST:  180mL OMNIPAQUE IOHEXOL 300 MG/ML  SOLN COMPARISON:  07/09/2018 FINDINGS: Lower chest: No acute abnormality. Hepatobiliary: No  focal liver abnormality is seen. No gallstones, gallbladder wall thickening, or biliary dilatation. Pancreas: Unremarkable. No pancreatic ductal dilatation or surrounding inflammatory changes. Spleen: Normal in size without focal abnormality. Adrenals/Urinary Tract: Adrenal glands are within normal limits bilaterally. Kidneys demonstrate no renal calculi or urinary tract obstructive changes. Stable renal cysts are noted bilaterally. Bladder is well distended. Stomach/Bowel: Colon demonstrates diffuse diverticular change throughout the sigmoid colon. Mucosal hyperemia and mild pericolonic inflammatory changes are now seen diffusely throughout the colon increased from the prior exam consistent with a more generalized likely infectious colitis. Small bowel and stomach are within normal limits with the exception of a small sliding-type hiatal hernia. No free air is seen. Vascular/Lymphatic: Aortic atherosclerosis. No enlarged abdominal or pelvic lymph nodes. Reproductive: Uterus and bilateral adnexa are unremarkable. Other: No abdominal wall hernia or abnormality. No abdominopelvic ascites. Musculoskeletal: Degenerative changes of lumbar spine are noted. IMPRESSION: Changes consistent with diffuse colitis without perforation or abscess formation. This has advanced significantly when compared with the prior exam. Electronically Signed   By: Inez Catalina M.D.   On: 07/24/2018 15:29   Dg Chest Portable 1  View  Result Date: 07/24/2018 CLINICAL DATA:  Chest pain EXAM: PORTABLE CHEST 1 VIEW COMPARISON:  01/20/2007 FINDINGS: Cardiac shadow is within normal limits. The lungs are well aerated bilaterally. No focal infiltrate or sizable effusion is seen. No acute bony abnormality is noted. IMPRESSION: No active disease. Electronically Signed   By: Inez Catalina M.D.   On: 07/24/2018 11:52   Dg Hip Unilat W Or Wo Pelvis 2-3 Views Left  Result Date: 07/24/2018 CLINICAL DATA:  Left hip pain after fall 4 days ago. EXAM: DG HIP (WITH OR WITHOUT PELVIS) 2-3V LEFT COMPARISON:  None. FINDINGS: There is no evidence of hip fracture or dislocation. There is no evidence of arthropathy or other focal bone abnormality. IMPRESSION: Negative. Electronically Signed   By: Marijo Conception M.D.   On: 07/24/2018 11:55        Scheduled Meds:  atorvastatin  20 mg Oral QHS   enoxaparin (LOVENOX) injection  40 mg Subcutaneous Q24H   Gerhardt's butt cream   Topical BID   insulin aspart  0-9 Units Subcutaneous TID WC   nystatin   Topical BID   pantoprazole  40 mg Oral Daily   vancomycin  125 mg Oral QID   Continuous Infusions:  sodium chloride 100 mL/hr at 07/25/18 0926   calcium gluconate 1,000 mg (07/25/18 1051)   metronidazole       LOS: 1 day    Time spent: 25 mins.More than 50% of that time was spent in counseling and/or coordination of care.      Shelly Coss, MD [facility] Pager [phone]  If 7PM-7AM, please contact night-coverage [email or web address] Password [facility] 07/25/2018, 11:23 AM

## 2018-07-25 NOTE — Progress Notes (Signed)
OT Cancellation Note  Patient Details Name: Shelby Walker MRN: [id number] DOB: 1934/02/14   Cancelled Treatment:    Reason Eval/Treat Not Completed: Medical issues which prohibited therapy. Pt with severe nausea and diarrhea.  Will try to check over weekend.  [place] 07/25/2018, 11:04 AM  Lesle Chris, OTR/L Acute Rehabilitation Services [phone] [facility] pager [phone] office 07/25/2018

## 2018-07-25 NOTE — Progress Notes (Signed)
PT Cancellation Note  Patient Details Name: Shelby Walker MRN: [id number] DOB: 1933-04-04   Cancelled Treatment:     PT order received but eval deferred at request of RN - pt with ongoing severe nausea and diarrhea.  Will follow.  [place] Pager [phone] Office [phone]    [facility] 07/25/2018, 10:54 AM

## 2018-07-26 ENCOUNTER — Inpatient Hospital Stay (HOSPITAL_COMMUNITY): Payer: Medicare Other

## 2018-07-26 ENCOUNTER — Inpatient Hospital Stay: Payer: Self-pay

## 2018-07-26 DIAGNOSIS — A419 Sepsis, unspecified organism: Secondary | ICD-10-CM

## 2018-07-26 DIAGNOSIS — A0472 Enterocolitis due to Clostridium difficile, not specified as recurrent: Secondary | ICD-10-CM

## 2018-07-26 DIAGNOSIS — R9431 Abnormal electrocardiogram [ECG] [EKG]: Secondary | ICD-10-CM

## 2018-07-26 DIAGNOSIS — R6521 Severe sepsis with septic shock: Secondary | ICD-10-CM

## 2018-07-26 LAB — CBC WITH DIFFERENTIAL/PLATELET
Abs Immature Granulocytes: 0.25 10*3/uL — ABNORMAL HIGH (ref 0.00–0.07)
Basophils Absolute: 0.3 10*3/uL — ABNORMAL HIGH (ref 0.0–0.1)
Basophils Relative: 1 %
Eosinophils Absolute: 0.1 10*3/uL (ref 0.0–0.5)
Eosinophils Relative: 0 %
HCT: 43.4 % (ref 36.0–46.0)
Hemoglobin: 12.8 g/dL (ref 12.0–15.0)
Immature Granulocytes: 1 %
Lymphocytes Relative: 5 %
Lymphs Abs: 1.1 10*3/uL (ref 0.7–4.0)
MCH: 29.4 pg (ref 26.0–34.0)
MCHC: 29.5 g/dL — ABNORMAL LOW (ref 30.0–36.0)
MCV: 99.5 fL (ref 80.0–100.0)
Monocytes Absolute: 3.1 10*3/uL — ABNORMAL HIGH (ref 0.1–1.0)
Monocytes Relative: 14 %
Neutro Abs: 18.3 10*3/uL — ABNORMAL HIGH (ref 1.7–7.7)
Neutrophils Relative %: 79 %
Platelets: 317 10*3/uL (ref 150–400)
RBC: 4.36 MIL/uL (ref 3.87–5.11)
RDW: 14.6 % (ref 11.5–15.5)
WBC: 23.2 10*3/uL — ABNORMAL HIGH (ref 4.0–10.5)
nRBC: 0.1 % (ref 0.0–0.2)

## 2018-07-26 LAB — BASIC METABOLIC PANEL
Anion gap: 18 — ABNORMAL HIGH (ref 5–15)
BUN: 40 mg/dL — ABNORMAL HIGH (ref 8–23)
CO2: 9 mmol/L — ABNORMAL LOW (ref 22–32)
Calcium: 6.2 mg/dL — CL (ref 8.9–10.3)
Chloride: 111 mmol/L (ref 98–111)
Creatinine, Ser: 2.84 mg/dL — ABNORMAL HIGH (ref 0.44–1.00)
GFR calc Af Amer: 17 mL/min — ABNORMAL LOW (ref >60)
GFR calc non Af Amer: 15 mL/min — ABNORMAL LOW (ref >60)
Glucose, Bld: 191 mg/dL — ABNORMAL HIGH (ref 70–99)
Potassium: 4.6 mmol/L (ref 3.5–5.1)
Sodium: 138 mmol/L (ref 135–145)

## 2018-07-26 LAB — COMPREHENSIVE METABOLIC PANEL
ALT: 11 U/L (ref 0–44)
AST: 15 U/L (ref 15–41)
Albumin: 1.5 g/dL — ABNORMAL LOW (ref 3.5–5.0)
Alkaline Phosphatase: 72 U/L (ref 38–126)
Anion gap: 13 (ref 5–15)
BUN: 42 mg/dL — ABNORMAL HIGH (ref 8–23)
CO2: 12 mmol/L — ABNORMAL LOW (ref 22–32)
Calcium: 5.2 mg/dL — CL (ref 8.9–10.3)
Chloride: 112 mmol/L — ABNORMAL HIGH (ref 98–111)
Creatinine, Ser: 2.92 mg/dL — ABNORMAL HIGH (ref 0.44–1.00)
GFR calc Af Amer: 17 mL/min — ABNORMAL LOW (ref >60)
GFR calc non Af Amer: 14 mL/min — ABNORMAL LOW (ref >60)
Glucose, Bld: 204 mg/dL — ABNORMAL HIGH (ref 70–99)
Potassium: 3.8 mmol/L (ref 3.5–5.1)
Sodium: 137 mmol/L (ref 135–145)
Total Bilirubin: <0.1 mg/dL — ABNORMAL LOW (ref 0.3–1.2)
Total Protein: 3.8 g/dL — ABNORMAL LOW (ref 6.5–8.1)

## 2018-07-26 LAB — ECHOCARDIOGRAM COMPLETE
Height: 61.5 in
Weight: 2016 oz

## 2018-07-26 LAB — GLUCOSE, CAPILLARY
Glucose-Capillary: 195 mg/dL — ABNORMAL HIGH (ref 70–99)
Glucose-Capillary: 179 mg/dL — ABNORMAL HIGH (ref 70–99)
Glucose-Capillary: 184 mg/dL — ABNORMAL HIGH (ref 70–99)
Glucose-Capillary: 214 mg/dL — ABNORMAL HIGH (ref 70–99)
Glucose-Capillary: 144 mg/dL — ABNORMAL HIGH (ref 70–99)

## 2018-07-26 LAB — LACTIC ACID, PLASMA
Lactic Acid, Venous: 2.7 mmol/L (ref 0.5–1.9)
Lactic Acid, Venous: 1.7 mmol/L (ref 0.5–1.9)

## 2018-07-26 LAB — PHOSPHORUS: Phosphorus: 6.1 mg/dL — ABNORMAL HIGH (ref 2.5–4.6)

## 2018-07-26 LAB — MAGNESIUM: Magnesium: 2.1 mg/dL (ref 1.7–2.4)

## 2018-07-26 MED ORDER — CALCIUM GLUCONATE-NACL 2-0.675 GM/100ML-% IV SOLN
2.0000 g | Freq: Once | INTRAVENOUS | Status: AC
  Administered 2018-07-26: 2000 mg via INTRAVENOUS
  Filled 2018-07-26: qty 100

## 2018-07-26 MED ORDER — VASOPRESSIN 20 UNIT/ML IV SOLN
0.0300 [IU]/min | INTRAVENOUS | Status: DC
  Administered 2018-07-26: 0.03 [IU]/min via INTRAVENOUS
  Filled 2018-07-26: qty 2

## 2018-07-26 MED ORDER — STERILE WATER FOR INJECTION IV SOLN
INTRAVENOUS | Status: DC
  Administered 2018-07-26 – 2018-07-27 (×6): via INTRAVENOUS
  Filled 2018-07-26 (×8): qty 850

## 2018-07-26 MED ORDER — SODIUM CHLORIDE 0.9 % IV BOLUS
1000.0000 mL | Freq: Once | INTRAVENOUS | Status: AC
  Administered 2018-07-26: 1000 mL via INTRAVENOUS

## 2018-07-26 MED ORDER — SODIUM CHLORIDE 0.9 % IV BOLUS
500.0000 mL | Freq: Once | INTRAVENOUS | Status: AC
  Administered 2018-07-26: 500 mL via INTRAVENOUS

## 2018-07-26 MED ORDER — SODIUM CHLORIDE 0.9% FLUSH: 10.0000 mL | INTRAVENOUS | Status: DC | PRN

## 2018-07-26 MED ORDER — METRONIDAZOLE IN NACL 5-0.79 MG/ML-% IV SOLN
500.0000 mg | Freq: Three times a day (TID) | INTRAVENOUS | Status: DC
  Administered 2018-07-26: 500 mg via INTRAVENOUS
  Filled 2018-07-26: qty 100

## 2018-07-26 MED ORDER — CHLORHEXIDINE GLUCONATE CLOTH 2 % EX PADS
6.0000 | MEDICATED_PAD | Freq: Every day | CUTANEOUS | Status: DC
  Administered 2018-07-26 – 2018-08-01 (×6): 6 via TOPICAL

## 2018-07-26 MED ORDER — PIPERACILLIN-TAZOBACTAM IN DEX 2-0.25 GM/50ML IV SOLN
2.2500 g | Freq: Four times a day (QID) | INTRAVENOUS | Status: DC
  Administered 2018-07-26 – 2018-07-28 (×7): 2.25 g via INTRAVENOUS
  Filled 2018-07-26 (×8): qty 50

## 2018-07-26 MED ORDER — AMIODARONE IV BOLUS ONLY 150 MG/100ML
150.0000 mg | Freq: Once | INTRAVENOUS | Status: AC
  Administered 2018-07-26: 150 mg via INTRAVENOUS
  Filled 2018-07-26: qty 100

## 2018-07-26 MED ORDER — DEXTROSE 5 % IV SOLN: 60.0000 mg/h | INTRAVENOUS | Status: DC

## 2018-07-26 MED ORDER — NOREPINEPHRINE BITARTRATE 1 MG/ML IV SOLN
0.0000 ug/min | INTRAVENOUS | Status: DC
  Administered 2018-07-26: 4 ug/min via INTRAVENOUS
  Filled 2018-07-26: qty 4

## 2018-07-26 MED ORDER — VANCOMYCIN 50 MG/ML ORAL SOLUTION: 500.0000 mg | Freq: Four times a day (QID) | ORAL | Status: DC

## 2018-07-26 MED ORDER — SODIUM CHLORIDE 0.9 % IV SOLN
250.0000 mL | INTRAVENOUS | Status: DC
  Administered 2018-07-26: 250 mL via INTRAVENOUS

## 2018-07-26 MED ORDER — AMIODARONE HCL IN DEXTROSE 360-4.14 MG/200ML-% IV SOLN
30.0000 mg/h | INTRAVENOUS | Status: DC
  Administered 2018-07-26 – 2018-07-28 (×5): 30 mg/h via INTRAVENOUS
  Filled 2018-07-26 (×6): qty 200

## 2018-07-26 MED ORDER — METOPROLOL TARTRATE 5 MG/5ML IV SOLN
5.0000 mg | Freq: Once | INTRAVENOUS | Status: AC
  Administered 2018-07-26: 5 mg via INTRAVENOUS
  Filled 2018-07-26: qty 5

## 2018-07-26 MED ORDER — NOREPINEPHRINE 4 MG/250ML-% IV SOLN
2.0000 ug/min | INTRAVENOUS | Status: DC
  Administered 2018-07-26: 2 ug/min via INTRAVENOUS
  Filled 2018-07-26: qty 250

## 2018-07-26 MED ORDER — DEXTROSE 5 % IV SOLN: 30.0000 mg/h | INTRAVENOUS | Status: DC

## 2018-07-26 MED ORDER — CALCIUM GLUCONATE-NACL 1-0.675 GM/50ML-% IV SOLN
1.0000 g | Freq: Once | INTRAVENOUS | Status: AC
  Administered 2018-07-26: 1000 mg via INTRAVENOUS
  Filled 2018-07-26: qty 50

## 2018-07-26 MED ORDER — HEPARIN SODIUM (PORCINE) 5000 UNIT/ML IJ SOLN
5000.0000 [IU] | Freq: Three times a day (TID) | INTRAMUSCULAR | Status: DC
  Administered 2018-07-26 – 2018-08-06 (×31): 5000 [IU] via SUBCUTANEOUS
  Filled 2018-07-26 (×32): qty 1

## 2018-07-26 MED ORDER — PHENYLEPHRINE HCL-NACL 10-0.9 MG/250ML-% IV SOLN: 25.0000 ug/min | INTRAVENOUS | Status: DC

## 2018-07-26 MED ORDER — PIPERACILLIN-TAZOBACTAM 3.375 G IVPB 30 MIN
3.3750 g | Freq: Once | INTRAVENOUS | Status: AC
  Administered 2018-07-26: 3.375 g via INTRAVENOUS
  Filled 2018-07-26 (×2): qty 50

## 2018-07-26 MED ORDER — MORPHINE SULFATE (PF) 2 MG/ML IV SOLN
2.0000 mg | Freq: Once | INTRAVENOUS | Status: AC
  Administered 2018-07-26: 02:00:00 2 mg via INTRAVENOUS
  Filled 2018-07-26: qty 1

## 2018-07-26 MED ORDER — METOCLOPRAMIDE HCL 5 MG/ML IJ SOLN
10.0000 mg | Freq: Once | INTRAMUSCULAR | Status: AC
  Administered 2018-07-26: 10 mg via INTRAVENOUS
  Filled 2018-07-26: qty 2

## 2018-07-26 MED ORDER — SODIUM CHLORIDE 0.9% FLUSH
10.0000 mL | Freq: Two times a day (BID) | INTRAVENOUS | Status: DC
  Administered 2018-07-26 – 2018-08-05 (×19): 10 mL

## 2018-07-26 MED ORDER — ORAL CARE MOUTH RINSE
15.0000 mL | Freq: Two times a day (BID) | OROMUCOSAL | Status: DC
  Administered 2018-07-26 – 2018-08-05 (×21): 15 mL via OROMUCOSAL

## 2018-07-26 MED ORDER — SODIUM CHLORIDE 0.9 % IV SOLN: 250.0000 mL | INTRAVENOUS | Status: DC

## 2018-07-26 MED ORDER — AMIODARONE HCL IN DEXTROSE 360-4.14 MG/200ML-% IV SOLN
60.0000 mg/h | INTRAVENOUS | Status: AC
  Administered 2018-07-26 (×2): 60 mg/h via INTRAVENOUS
  Filled 2018-07-26 (×2): qty 200

## 2018-07-26 NOTE — Progress Notes (Signed)
CRITICAL VALUE ALERT  Critical Value: Lactic Acid 2.7  Date & Time Notied: 07/26/2018 8466  Provider Notified: Loanne Drilling, MD  Orders Received/Actions taken: Bolus being given. No new orders at this time

## 2018-07-26 NOTE — Progress Notes (Signed)
Pharmacy Antibiotic Note  Shelby Walker is a 85 y.o. female admitted on 07/24/2018 with C diff colitis  Pharmacy has been consulted for zosyn dosing for IAI. WBC 23.2. SCR 1.22>>2.84, hypotensive, AF.  Plan: Zosyn 3.375 gm IV over 30 minutes followed by zosyn 2.25 mg IV q6h Continue on PO vanc 125 mg QID for Cdiff F/u renal fxn, WBC, temp, culture data  Height: 5' 1.5" (156.2 cm) Weight: 126 lb (57.2 kg) IBW/kg (Calculated) : 48.95  Temp (24hrs), Avg:99 F (37.2 C), Min:98.2 F (36.8 C), Max:99.9 F (37.7 C)  Recent Labs  Lab 07/24/18 1120 07/24/18 2111 07/24/18 2339 07/25/18 0614 07/26/18 0249  WBC 15.3*  --   --  13.9* 23.2*  CREATININE 1.22*  --   --  1.44* 2.84*  LATICACIDVEN  --  1.4 1.0  --   --     Estimated Creatinine Clearance: 11.6 mL/min (A) (by C-G formula based on SCr of 2.84 mg/dL (H)).    Allergies  Allergen Reactions  . Compazine [Prochlorperazine Edisylate] Other (See Comments)    REACTION: " tongue swell and unable to swallow"  . Sulfonamide Derivatives Other (See Comments)    REACTION: " broke out with fine itching bumps"   Antimicrobials this admission:  5/22 Flagyl>> 5/21 PO vanc>>   (5/31 10 days) 5/23 zosyn>> 5/21 LVQ x 1 dose Dose adjustments this admission:   Microbiology results:  5/22 MRSA PCR neg 5/21 C diff: antigen & toxin positive 5/21 GI PCR neg 5/21 SARS CoV2 negative 5/6 UCx:NGF 5/23 BCx2>>  Thank you for allowing pharmacy to be a part of this patient's care.  Eudelia Bunch, Pharm.D 07/26/2018 8:40 AM

## 2018-07-26 NOTE — Consult Note (Signed)
NAME:  Shelby Walker, MRN:  [id number], DOB:  1933-09-23, LOS: 2 ADMISSION DATE:  07/24/2018, CONSULTATION DATE:  07/26/18 REFERRING MD:  Shelly Coss, MD CHIEF COMPLAINT:  Hypovolemic shock  Brief History   85 year old female 85 year old female who presented with nausea, vomiting and diarrhea x 3 weeks and admitted with sepsis secondary to C. Diff colitis. Developed hypovolemic vs septic shock. PCCM consulted for further management  History of present illness   85 year old female who presented with nausea, vomiting and diarrhea x 3 weeks and admitted with sepsis secondary to colitis. Prior to admission, patient was seen in urgent care clinic and diagnosed with infectious colitis and treated with 7 day course of cipro and flagyl however diarrhea persistent with ~10 bowel movements daily. She presented to [place] point and CT A/P demonstrated diffuse colitis without perforation or abscess. C.diff positive. Labs also significant for AKI.   Transferred to SDU overnight. This morning patient hypotensive and in profound metabolic acidosis. PCCM consulted for management of critical illness.  Past Medical History  DM2, HTN, HLD, GERD, RA  Significant Hospital Events   5/21 Admitted 5/23 Transferred to ICU for hypotension requiring vasopressors  Consults:  PCCM 5/23  Procedures:    Significant Diagnostic Tests:  CT A/P Diffuse colitis without perforation or abscess formation  Micro Data:  C.diff - Positive  Antimicrobials:  Levofloxacin 5/21 once Flagyl 5/21>5/22 Vanc 5/22 > Zosyn 5/23>  Interim history/subjective:    Objective   Blood pressure (!) 92/50, pulse 92, temperature 98.6 F (37 C), temperature source Axillary, resp. rate 15, height 5' 1.5" (1.562 m), weight 57.2 kg, SpO2 96 %.        Intake/Output Summary (Last 24 hours) at 07/26/2018 0814 Last data filed at 07/26/2018 0604 Gross per 24 hour  Intake 2218.7 ml  Output 300 ml  Net 1918.7 ml   Filed Weights    07/24/18 1958  Weight: 57.2 kg   Physical Exam: General: Elderly female, in mild to moderate discomfort HENT: [place], AT, OP clear, MMM Eyes: EOMI, no scleral icterus Respiratory: Clear to auscultation bilaterally.  No crackles, wheezing or rales Cardiovascular: RRR, -M/R/G, no JVD GI: BS+, soft, voluntary guarding, tender to palpation in midline and left side Extremities:-Edema,-tenderness Neuro: AAO x4, CNII-XII grossly intact Skin: Intact, no rashes or bruising GU: Foley in place  Resolved Hospital Problem list     Assessment & Plan:   Hypovolemic and septic shock secondary to C. Difficile colitis IVF bolus now Start low-dose pressor with MAP goal >65 Obtain blood cultures Broaden antibiotic coverage from Flagyl to Zosyn Continue PO Vanc TTE Trend LA DC PPI Hold home anti-hypertensives  Metabolic acidosis secondary to bicarbonate loss in setting of diarrhea Sodium bicarbonate gtt Trend LA Serial BMP  AKI secondary ATN  Management as above Monitor UOP/Cr  Hypocalcemia Replete  RA on chronic prednisone therapy Continue home dose prednisone If hypotension persists despite IVF resuscitation, consider stress-dose steroids  Best practice:  Diet: NPO Pain/Anxiety/Delirium protocol (if indicated): -- VAP protocol (if indicated): -- DVT prophylaxis:  GI prophylaxis:  Glucose control: Trend CBG Mobility: BR Code Status: Full Code Family Communication: Primary team will update family (daughter) and address [place] Disposition:   Labs   CBC: Recent Labs  Lab 07/24/18 1120 07/25/18 0614 07/26/18 0249  WBC 15.3* 13.9* 23.2*  NEUTROABS 12.6*  --  18.3*  HGB 11.4* 11.6* 12.8  HCT 36.4 36.8 43.4  MCV 92.9 93.2 99.5  PLT  247 240 195    Basic Metabolic Panel: Recent Labs  Lab 07/24/18 1120 07/24/18 2339 07/25/18 0614 07/26/18 0249  NA 135  --  136 138  K 4.2  --  4.2 4.6  CL 105  --  111 111  CO2 19*  --  13* 9*  GLUCOSE 155*  --  133* 191*  BUN 23  --   27* 40*  CREATININE 1.22*  --  1.44* 2.84*  CALCIUM 7.4*  --  6.4* 6.2*  MG  --  1.8  --   --    GFR: Estimated Creatinine Clearance: 11.6 mL/min (A) (by C-G formula based on SCr of 2.84 mg/dL (H)). Recent Labs  Lab 07/24/18 1120 07/24/18 2111 07/24/18 2339 07/25/18 0614 07/26/18 0249  WBC 15.3*  --   --  13.9* 23.2*  LATICACIDVEN  --  1.4 1.0  --   --     Liver Function Tests: Recent Labs  Lab 07/24/18 1120 07/25/18 0614  AST 14* 13*  ALT 11 11  ALKPHOS 41 35*  BILITOT 1.0 1.2  PROT 5.9* 4.9*  ALBUMIN 2.9* 2.2*   Recent Labs  Lab 07/24/18 1120  LIPASE 23   No results for input(s): AMMONIA in the last 168 hours.  ABG No results found for: PHART, PCO2ART, PO2ART, HCO3, TCO2, ACIDBASEDEF, O2SAT   Coagulation Profile: No results for input(s): INR, PROTIME in the last 168 hours.  Cardiac Enzymes: Recent Labs  Lab 07/24/18 1120 07/25/18 1719  TROPONINI <0.03 <0.03    HbA1C: Hgb A1c MFr Bld  Date/Time Value Ref Range Status  07/24/2018 09:11 PM 8.8 (H) 4.8 - 5.6 % Final    Comment:    (NOTE) Pre diabetes:          5.7%-6.4% Diabetes:              >6.4% Glycemic control for   <7.0% adults with diabetes     CBG: Recent Labs  Lab 07/25/18 0734 07/25/18 1138 07/25/18 1630 07/25/18 2314 07/26/18 0719  GLUCAP 141* 163* 149* 169* 195*    Review of Systems:   Reports diarrhea  Past Medical History  She,  has a past medical history of Arthritis, rheumatoid ([place]), Bruit, Chest pain, unspecified, Diabetes mellitus, Dyslipidemia, Ejection fraction, GERD (gastroesophageal reflux disease), Mitral regurgitation (April 21, 2009), Osteoporosis, Palpitations, Psoriasis, and Rheumatoid arthritis ([place]).   Surgical History    Past Surgical History:  Procedure Laterality Date  . FRACTURE SURGERY       Social History   reports that she has never smoked. She has never used smokeless tobacco. She reports that she does not drink alcohol or use drugs.    Family History   Her family history includes Diabetes in her father and mother; Heart attack in her father and mother; Heart failure in her father and mother; Stroke in her sister.   Allergies Allergies  Allergen Reactions  . Compazine [Prochlorperazine Edisylate] Other (See Comments)    REACTION: " tongue swell and unable to swallow"  . Sulfonamide Derivatives Other (See Comments)    REACTION: " broke out with fine itching bumps"     Home Medications  Prior to Admission medications   Medication Sig Start Date End Date Taking? Authorizing Provider  aspirin 325 MG EC tablet Take 325 mg by mouth at bedtime.   Yes [provider]  atorvastatin (LIPITOR) 20 MG tablet TAKE 1 TABLET BY MOUTH EVERY DAY Patient taking differently: Take 20 mg by mouth at bedtime.  01/27/18  Yes End, Harrell Gave, MD  bisacodyl (DULCOLAX) 5 MG EC tablet Take 5 mg by mouth daily as needed for moderate constipation.   Yes [provider]  Calcium Carbonate-Vitamin D (CALTRATE 600+D) 600-400 MG-UNIT per tablet Take 1 tablet by mouth daily.     Yes [provider]  Cholecalciferol 1000 units capsule Take 1,000 Units by mouth daily.    Yes [provider]  diltiazem (CARDIZEM) 60 MG tablet Take 60 mg by mouth at bedtime.   Yes [provider]  esomeprazole (NEXIUM) 40 MG capsule Take 40 mg by mouth daily at 12 noon.   Yes [provider]  etanercept (ENBREL) 50 MG/ML injection Inject 50 mg into the skin once a week.   Yes [provider]  insulin NPH Human (NOVOLIN N) 100 UNIT/ML injection Inject 20 Units into the skin at bedtime.   Yes [provider]  metoprolol succinate (TOPROL-XL) 25 MG 24 hr tablet Take 25 mg by mouth daily.     Yes [provider]  Multiple Vitamins-Minerals (CENTRUM SILVER ULTRA WOMENS) TABS Take 1 tablet by mouth daily.    Yes [provider]  nitroGLYCERIN (NITROSTAT) 0.4 MG SL tablet 1 TABLET UNDER TONGUE  AT ONSET OF CHEST PAIN YOU MAY REPEAT EVERY 5 MINUTES FOR UP TO 3 DOSES. Patient taking differently: Place 0.4 mg under the tongue See admin instructions. 1 TABLET UNDER TONGUE AT ONSET OF CHEST PAIN YOU MAY REPEAT EVERY 5 MINUTES FOR UP TO 3 DOSES. 04/22/18  Yes Nahser, Wonda Cheng, MD  Omega-3 Fatty Acids (FISH OIL) 1200 MG CAPS Take 1 capsule by mouth daily at 12 noon.   Yes [provider]  predniSONE (DELTASONE) 5 MG tablet Take 5 mg by mouth daily. 08/31/16  Yes [provider]  polyethylene glycol (MIRALAX) 17 g packet Take 17 g by mouth daily. Patient not taking: Reported on 07/24/2018 07/09/18   Fredia Sorrow, MD     Critical care time: 45 min    The patient is critically ill with multiple organ systems failure and requires high complexity decision making for assessment and support, frequent evaluation and titration of therapies, application of advanced monitoring technologies and extensive interpretation of multiple databases.   Critical Care Time devoted to patient care services described in this note is 45 Minutes. This time reflects time of care of this signee Dr. Rodman Pickle. This critical care time does not reflect procedure time, or teaching time or supervisory time of PA/NP/Med student/Med Resident etc but could involve care discussion time. Care coordinated with Hospitalist and bedside RN.  Rodman Pickle, M.D. [facility] Pulmonary/Critical Care Medicine Pager: [phone] After hours pager: [phone]

## 2018-07-26 NOTE — Progress Notes (Signed)
OT Cancellation Note  Patient Details Name: Shelby Walker MRN: [id number] DOB: [birth year]   Cancelled Treatment:    Reason Eval/Treat Not Completed: Other (comment).  OT eval deferred this date at request of RN 2* pt with low BP.  Will follow.  [place] Circle, OTR/L Acute Rehab Services Pager [phone] Office [phone]      Almon Register 07/26/2018, 8:23 AM

## 2018-07-26 NOTE — Progress Notes (Signed)
Pt had episode of sustained SVT with HR in the 160's. Physician paged, 5 mg of Lopressor ordered and administered, EKG done, HR currently in the 90's. Will continue to monitor.

## 2018-07-26 NOTE — Progress Notes (Signed)
PCCM Interval Note  Updated daughter on patient's critical condition. Patient's blood pressure has improved with IVF resuscitation alone however will consider vasopressor support if needed.  Informed daughter of the discussion I held with her mom regarding goals of care. Patient does not wish to burden her family with the decision for code status. She states she is very independent and lives with her husband at home. However, she states that she would not want to burden to her family if her medical condition were to worsen and if she would not be able to return to her independent lifestyle.  She clearly states that she does not wish to receive CPR or heroic measures. She does not wish to be on mechanical ventilation. She does wish to continue current medical treatment with IVF, antibiotics and vasopressors if needed. I explained DNR/DNI status and patient expressed understanding.  Daughter has never discussed this with her mother before and expressed concern. I offered for a telephone/video conference with her mother. Daughter will contact patient's husband and call within the hour.  Plan: Change code status to DNR/DNI per patient's wishes Arrange for family conference  Rodman Pickle, M.D. [facility] Pulmonary/Critical Care Medicine Pager: [phone] After hours pager: [phone]

## 2018-07-26 NOTE — Procedures (Signed)
Central Venous Catheter Insertion Procedure Note Shelby Walker [id number] [birth year]  Procedure: Insertion of Central Venous Catheter Indications: Drug and/or fluid administration  Procedure Details Consent: Risks of procedure as well as the alternatives and risks of each were explained to the (patient/caregiver).  Consent for procedure obtained. Time Out: Verified patient identification, verified procedure, site/side was marked, verified correct patient position, special equipment/implants available, medications/allergies/relevent history reviewed, required imaging and test results available.  Performed  Maximum sterile technique was used including antiseptics, cap, gloves, gown, hand hygiene, mask and sheet. Skin prep: Chlorhexidine; local anesthetic administered A antimicrobial bonded/coated triple lumen catheter was placed in the left internal jugular vein using the Seldinger technique.  Evaluation Blood flow good Complications: No apparent complications Patient did tolerate procedure well. Chest X-ray ordered to verify placement.  CXR: pending.  Chi Rodman Pickle 07/26/2018, 11:28 AM

## 2018-07-26 NOTE — Progress Notes (Signed)
  Echocardiogram 2D Echocardiogram has been performed.  Zackaria Burkey G Mckinsey Keagle 07/26/2018, 1:34 PM

## 2018-07-26 NOTE — Progress Notes (Signed)
CRITICAL VALUE ALERT  Critical Value:  Calcium 5.2  Date & Time Notied:  07/26/2018 1637  Provider Notified: Loanne Drilling, MD  Orders Received/Actions taken: 2g Calcium Gluconate ordered

## 2018-07-26 NOTE — Progress Notes (Signed)
PICC RN spoke with patient RN Nira Conn regarding PICC placement.  PICC RN requested for RN to follow up with MD regarding patient renal function (CrCl 16) for possible renal consult. Also, patient bp is 40'G-86'P systolic.  PICC RN educated RN that patient BP is too unstable at this moment to place PICC; recommended a CVC if unable to allow for possible renal consult or increase in BP ( need for BP to remain stable for at least 3 hrs before PICC placement).  RN to notify MD to discuss patient plan of care for PICC placement.

## 2018-07-26 NOTE — Progress Notes (Addendum)
CRITICAL VALUE ALERT  Critical Value: Ca 6.2  Date & Time Notied:  07/26/18 0520  Provider Notified: Loletha Grayer Bodenhemier  Orders Received/Actions taken: Calcium Gluconate

## 2018-07-26 NOTE — Progress Notes (Signed)
PT Cancellation Note  Patient Details Name: Shelby Walker MRN: [id number] DOB: 30-Apr-1933   Cancelled Treatment:      PT eval deferred this date at request of RN 2* pt with low BP.  Will follow.  Elanie Hammitt 07/26/2018, 8:18 AM

## 2018-07-26 NOTE — Progress Notes (Signed)
PROGRESS NOTE    Shelby Walker  KTG:[id number] DOB: June 10, 1933 DOA: 07/24/2018 PCP: Reynold Bowen, MD   Brief Narrative: Patient is 85 year old female with history of diabetes type 2, hypertension, hyperlipidemia, GERD,rheumatoid arthritis who presents to the emergency department with complaints of nausea, vomiting and diarrhea.  Her symptoms started 3 weeks ago.  Patient was seen at urgent care and was diagnosed with infectious colitis and she completed a 7 days course of ciprofloxacin and Flagyl without improvement of symptoms.  Diarrhea persisted so she presented to the med [facility]  She complained of about 8-10 episodes of loose stools, abdominal pain.  When she presented she had mild fever, tachycardia, hypotension.  White cell counts was elevated.  Had mild acute kidney injury.  CT abdomen/pelvis showed diffuse colitis without perforation or abscess.  C. difficile came out to be positive.  Started on PO vancomycin/ Flagyl today. Patient started becoming hypotensive and tachycardic on evening of 07/25/2018 and she was transferred to stepdown.  This morning she developed acute kidney injury, leukocytosis worsened.  She was hypotensive and tachycardic.  Case discussed with ICU and she is handed over ICU team.  Extensive discussion held with family.  Assessment & Plan:   Principal Problem:   Clostridium difficile colitis Active Problems:   Rheumatoid arthritis ([place])   Diabetes mellitus ([place])   GERD (gastroesophageal reflux disease)   Diverticulitis   Colitis presumed infectious   Colitis  Suspected septic shock: Hypotensive and tachycardic this morning.  Follow-up cultures.  Lactic acid elevated today.  She was given boluses of IV fluids.  If she remains hypotensive, ICU planning for starting on vasopressors.  C. difficile colitis: CT abdomen/pelvis as above.  Will take this as severe C. difficile colitis.  She had leukocytosis and acute kidney injury on presentation.  She was  started on oral vancomycin.  We will add Flagyl. Continue serial abdominal examination.  Continue contact precaution. She does not give any history of C. difficile in the past.  She was taking doxycycline in March for her tooth problem.  She presented on 07/09/[facility] at [facility] with abdominal pain.CT abdomen/pelvis was done which showed diverticulosis/diverticulitis.  She was discharged on Flagyl and Cipro. Continue clear liquid diet.  Hypocalcemia: Supplemented with calcium  Acute kidney injury: Worsened today.  Continue IV fluids  Diabetes mellitus: Continue sliding-scale insulin for now.  On insulin at home.  Hemoglobin A1c of 8.8.  Hypertension: Hypotensive now.  Home medications on hold.  Hyperlipidemia: Continue statin  GERD: Continue PPI  Rheumatoid arthritis: On Enbrel and prednisone at home which are on hold.  Debility/deconditioning: Physical therapy consulted and she will be evaluated when she becomes hemodynamically stable.          DVT prophylaxis: Lovenox Code Status: DNR Family Communication: Discussed with daughter and son on phone Disposition Plan: Undetermined at this point   Consultants: PCCM  Procedures: None  Antimicrobials:  Anti-infectives (From admission, onward)   Start     Dose/Rate Route Frequency Ordered Stop   07/26/18 1800  levofloxacin (LEVAQUIN) IVPB 750 mg  Status:  Discontinued     750 mg 100 mL/hr over 90 Minutes Intravenous Every 48 hours 07/24/18 2044 07/24/18 2255   07/26/18 1600  piperacillin-tazobactam (ZOSYN) IVPB 2.25 g     2.25 g 100 mL/hr over 30 Minutes Intravenous Every 6 hours 07/26/18 0847     07/26/18 0845  piperacillin-tazobactam (ZOSYN) IVPB 3.375 g     3.375 g 100 mL/hr over  30 Minutes Intravenous  Once 07/26/18 0828 07/26/18 1003   07/25/18 1400  metroNIDAZOLE (FLAGYL) IVPB 500 mg  Status:  Discontinued     500 mg 100 mL/hr over 60 Minutes Intravenous Every 8 hours 07/25/18 1122 07/26/18 0827   07/24/18  2359  metroNIDAZOLE (FLAGYL) IVPB 500 mg  Status:  Discontinued     500 mg 100 mL/hr over 60 Minutes Intravenous Every 8 hours 07/24/18 2041 07/24/18 2255   07/24/18 2245  vancomycin (VANCOCIN) 50 mg/mL oral solution 125 mg     125 mg Oral 4 times daily 07/24/18 2232 08/03/18 2159   07/24/18 2045  levofloxacin (LEVAQUIN) IVPB 750 mg  Status:  Discontinued     750 mg 100 mL/hr over 90 Minutes Intravenous Every 24 hours 07/24/18 2041 07/24/18 2044   07/24/18 1600  levofloxacin (LEVAQUIN) IVPB 750 mg     750 mg 100 mL/hr over 90 Minutes Intravenous  Once 07/24/18 1551 07/24/18 1926   07/24/18 1600  metroNIDAZOLE (FLAGYL) IVPB 500 mg     500 mg 100 mL/hr over 60 Minutes Intravenous  Once 07/24/18 1551 07/24/18 1755      Subjective: Patient seen and examined the bedside this morning.  Hypotensive and tachycardic.  She was transferred to stepdown yesterday evening.  Patient remains weak but alert and oriented.  Complains of abdominal pain.  Continues to have diarrhea.  Objective: Vitals:   07/26/18 0830 07/26/18 0845 07/26/18 0900 07/26/18 1000  BP: (!) 90/43 96/63 99/62    Pulse:    (!) 136  Resp: 19 16 18  (!) 23  Temp:      TempSrc:      SpO2:      Weight:      Height:        Intake/Output Summary (Last 24 hours) at 07/26/2018 1120 Last data filed at 07/26/2018 0604 Gross per 24 hour  Intake 2218.7 ml  Output 300 ml  Net 1918.7 ml   Filed Weights   07/24/18 1958  Weight: 57.2 kg    Examination:  General exam: Elderly debilitated female  HEENT:PERRL,Oral mucosa moist, Ear/Nose normal on gross exam Respiratory system: Bilateral equal air entry, normal vesicular breath sounds, no wheezes or crackles  Cardiovascular system: Sinus tachycardia. No JVD, murmurs, rubs, gallops or clicks. Gastrointestinal system: Abdomen is distended, soft.  Generalized tenderness. No organomegaly or masses felt. Normal bowel sounds heard. Central nervous system: Alert and oriented. No focal  neurological deficits. Extremities: No edema, no clubbing ,no cyanosis, distal peripheral pulses palpable. Skin: No rashes, lesions or ulcers,no icterus ,no pallor. Chronic venous stasis changes on bilateral lower extremities    Data Reviewed: I have personally reviewed following labs and imaging studies  CBC: Recent Labs  Lab 07/24/18 1120 07/25/18 0614 07/26/18 0249  WBC 15.3* 13.9* 23.2*  NEUTROABS 12.6*  --  18.3*  HGB 11.4* 11.6* 12.8  HCT 36.4 36.8 43.4  MCV 92.9 93.2 99.5  PLT 247 240 299   Basic Metabolic Panel: Recent Labs  Lab 07/24/18 1120 07/24/18 2339 07/25/18 0614 07/26/18 0249  NA 135  --  136 138  K 4.2  --  4.2 4.6  CL 105  --  111 111  CO2 19*  --  13* 9*  GLUCOSE 155*  --  133* 191*  BUN 23  --  27* 40*  CREATININE 1.22*  --  1.44* 2.84*  CALCIUM 7.4*  --  6.4* 6.2*  MG  --  1.8  --  2.1  PHOS  --   --   --  6.1*   GFR: Estimated Creatinine Clearance: 11.6 mL/min (A) (by C-G formula based on SCr of 2.84 mg/dL (H)). Liver Function Tests: Recent Labs  Lab 07/24/18 1120 07/25/18 0614  AST 14* 13*  ALT 11 11  ALKPHOS 41 35*  BILITOT 1.0 1.2  PROT 5.9* 4.9*  ALBUMIN 2.9* 2.2*   Recent Labs  Lab 07/24/18 1120  LIPASE 23   No results for input(s): AMMONIA in the last 168 hours. Coagulation Profile: No results for input(s): INR, PROTIME in the last 168 hours. Cardiac Enzymes: Recent Labs  Lab 07/24/18 1120 07/25/18 1719  TROPONINI <0.03 <0.03   BNP (last 3 results) No results for input(s): PROBNP in the last 8760 hours. HbA1C: Recent Labs    07/24/18 2111  HGBA1C 8.8*   CBG: Recent Labs  Lab 07/25/18 0734 07/25/18 1138 07/25/18 1630 07/25/18 2314 07/26/18 0719  GLUCAP 141* 163* 149* 169* 195*   Lipid Profile: No results for input(s): CHOL, HDL, LDLCALC, TRIG, CHOLHDL, LDLDIRECT in the last 72 hours. Thyroid Function Tests: Recent Labs    07/24/18 2111  TSH 1.562   Anemia Panel: No results for input(s):  VITAMINB12, FOLATE, FERRITIN, TIBC, IRON, RETICCTPCT in the last 72 hours. Sepsis Labs: Recent Labs  Lab 07/24/18 2111 07/24/18 2339 07/26/18 0857  LATICACIDVEN 1.4 1.0 2.7*    Recent Results (from the past 240 hour(s))  Gastrointestinal Panel by PCR , Stool     Status: None   Collection Time: 07/24/18  4:40 PM  Result Value Ref Range Status   Campylobacter species NOT DETECTED NOT DETECTED Final   Plesimonas shigelloides NOT DETECTED NOT DETECTED Final   Salmonella species NOT DETECTED NOT DETECTED Final   Yersinia enterocolitica NOT DETECTED NOT DETECTED Final   Vibrio species NOT DETECTED NOT DETECTED Final   Vibrio cholerae NOT DETECTED NOT DETECTED Final   Enteroaggregative E coli (EAEC) NOT DETECTED NOT DETECTED Final   Enteropathogenic E coli (EPEC) NOT DETECTED NOT DETECTED Final   Enterotoxigenic E coli (ETEC) NOT DETECTED NOT DETECTED Final   Shiga like toxin producing E coli (STEC) NOT DETECTED NOT DETECTED Final   Shigella/Enteroinvasive E coli (EIEC) NOT DETECTED NOT DETECTED Final   Cryptosporidium NOT DETECTED NOT DETECTED Final   Cyclospora cayetanensis NOT DETECTED NOT DETECTED Final   Entamoeba histolytica NOT DETECTED NOT DETECTED Final   Giardia lamblia NOT DETECTED NOT DETECTED Final   Adenovirus F40/41 NOT DETECTED NOT DETECTED Final   Astrovirus NOT DETECTED NOT DETECTED Final   Norovirus GI/GII NOT DETECTED NOT DETECTED Final   Rotavirus A NOT DETECTED NOT DETECTED Final   Sapovirus (I, II, IV, and V) NOT DETECTED NOT DETECTED Final    Comment: Performed at [facility], [address] [place], [place] [address]  SARS Coronavirus 2 (Hosp order,Performed in [place] lab via [employer])     Status: None   Collection Time: 07/24/18  4:40 PM  Result Value Ref Range Status   SARS Coronavirus 2 ([employer] Now) NEGATIVE NEGATIVE Final    Comment: (NOTE) Interpretive Result Comment(s): COVID 19 Positive SARS CoV 2 target nucleic acids are  DETECTED. The SARS CoV 2 RNA is generally detectable in upper and lower respiratory specimens during the acute phase of infection.  Positive results are indicative of active infection with SARS CoV 2.  Clinical correlation with patient history and other diagnostic information is necessary to determine patient infection status.  Positive results do not rule out bacterial infection or coinfection with other viruses.  The expected result is Negative. COVID 19 Negative SARS CoV 2 target nucleic acids are NOT DETECTED. The SARS CoV 2 RNA is generally detectable in upper and lower respiratory specimens during the acute phase of infection.  Negative results do not preclude SARS CoV 2 infection, do not rule out coinfections with other pathogens, and should not be used as the sole basis for treatment or other patient management decisions.  Negative results must be combined with clinical  observations, patient history, and epidemiological information. The expected result is Negative. Invalid Presence or absence of SARS CoV 2 nucleic acids cannot be determined. Repeat testing was performed on the submitted specimen and repeated Invalid results were obtained.  If clinically indicated, additional testing on a new specimen with an alternate test methodology [phone]) is advised.  The SARS CoV 2 RNA is generally detectable in upper and lower respiratory specimens during the acute phase of infection. The expected result is Negative. Fact Sheet for Patients:  GolfingFamily.no Fact Sheet for Healthcare Providers: https://www.hernandez-brewer.com/ This test is not yet approved or cleared by the [place] FDA and has been authorized for detection and/or diagnosis of SARS CoV 2 by FDA under an Emergency Use Authorization (EUA).  This EUA will remain in effect (meaning this test can be used) for the duration of the COVID19 d eclaration under Section 564(b)(1) of the  Act, 21 U.S.C. section [phone] 3(b)(1), unless the authorization is terminated or revoked sooner. Performed at [facility], [address] [place], [place] [address]   C difficile quick scan w PCR reflex     Status: Abnormal   Collection Time: 07/24/18  6:50 PM  Result Value Ref Range Status   C Diff antigen POSITIVE (A) NEGATIVE Final   C Diff toxin POSITIVE (A) NEGATIVE Final   C Diff interpretation Toxin producing C. difficile detected.  Final    Comment: CRITICAL RESULT CALLED TO, READ BACK BY AND VERIFIED WITH: Harlen Labs RN 07/24/18 2224 JDW Performed at [place] [facility], [place] [address], [place],  [address]   MRSA PCR Screening     Status: None   Collection Time: 07/25/18  6:07 PM  Result Value Ref Range Status   MRSA by PCR NEGATIVE NEGATIVE Final    Comment:        The GeneXpert MRSA Assay (FDA approved for NASAL specimens only), is one component of a comprehensive MRSA colonization surveillance program. It is not intended to diagnose MRSA infection nor to guide or monitor treatment for MRSA infections. Performed at [facility], [place] [address], [place],  [address]          Radiology Studies: Dg Abd 1 View  Result Date: 07/26/2018 CLINICAL DATA:  Hypotension EXAM: ABDOMEN - 1 VIEW COMPARISON:  CT abdomen and pelvis Jul 24, 2018 FINDINGS: There is no appreciable bowel dilatation or air-fluid level to suggest bowel obstruction. No free air. Vascular calcification noted in the pelvis. IMPRESSION: No demonstrable bowel obstruction or free air. Electronically Signed   By: Lowella Grip III M.D.   On: 07/26/2018 10:23   Ct Abdomen Pelvis W Contrast  Result Date: 07/24/2018 CLINICAL DATA:  Fevers abdominal pain EXAM: CT ABDOMEN AND PELVIS WITH CONTRAST TECHNIQUE: Multidetector CT imaging of the abdomen and pelvis was performed using the standard protocol following bolus administration of intravenous contrast. CONTRAST:   182mL OMNIPAQUE IOHEXOL 300 MG/ML  SOLN COMPARISON:  07/09/2018 FINDINGS: Lower chest: No acute abnormality. Hepatobiliary: No focal liver abnormality  is seen. No gallstones, gallbladder wall thickening, or biliary dilatation. Pancreas: Unremarkable. No pancreatic ductal dilatation or surrounding inflammatory changes. Spleen: Normal in size without focal abnormality. Adrenals/Urinary Tract: Adrenal glands are within normal limits bilaterally. Kidneys demonstrate no renal calculi or urinary tract obstructive changes. Stable renal cysts are noted bilaterally. Bladder is well distended. Stomach/Bowel: Colon demonstrates diffuse diverticular change throughout the sigmoid colon. Mucosal hyperemia and mild pericolonic inflammatory changes are now seen diffusely throughout the colon increased from the prior exam consistent with a more generalized likely infectious colitis. Small bowel and stomach are within normal limits with the exception of a small sliding-type hiatal hernia. No free air is seen. Vascular/Lymphatic: Aortic atherosclerosis. No enlarged abdominal or pelvic lymph nodes. Reproductive: Uterus and bilateral adnexa are unremarkable. Other: No abdominal wall hernia or abnormality. No abdominopelvic ascites. Musculoskeletal: Degenerative changes of lumbar spine are noted. IMPRESSION: Changes consistent with diffuse colitis without perforation or abscess formation. This has advanced significantly when compared with the prior exam. Electronically Signed   By: Inez Catalina M.D.   On: 07/24/2018 15:29   Dg Chest Port 1 View  Result Date: 07/26/2018 CLINICAL DATA:  Hypotension EXAM: PORTABLE CHEST 1 VIEW COMPARISON:  Jul 24, 2018 FINDINGS: No edema or consolidation. Heart size and pulmonary vascularity are normal. No adenopathy. No bone lesions. IMPRESSION: No edema or consolidation.  Stable cardiac silhouette. Electronically Signed   By: Lowella Grip III M.D.   On: 07/26/2018 10:22   Dg Chest Portable 1  View  Result Date: 07/24/2018 CLINICAL DATA:  Chest pain EXAM: PORTABLE CHEST 1 VIEW COMPARISON:  01/20/2007 FINDINGS: Cardiac shadow is within normal limits. The lungs are well aerated bilaterally. No focal infiltrate or sizable effusion is seen. No acute bony abnormality is noted. IMPRESSION: No active disease. Electronically Signed   By: Inez Catalina M.D.   On: 07/24/2018 11:52   Dg Hip Unilat W Or Wo Pelvis 2-3 Views Left  Result Date: 07/24/2018 CLINICAL DATA:  Left hip pain after fall 4 days ago. EXAM: DG HIP (WITH OR WITHOUT PELVIS) 2-3V LEFT COMPARISON:  None. FINDINGS: There is no evidence of hip fracture or dislocation. There is no evidence of arthropathy or other focal bone abnormality. IMPRESSION: Negative. Electronically Signed   By: Marijo Conception M.D.   On: 07/24/2018 11:55   [place] Ekg Site Rite  Result Date: 07/26/2018 If Site Rite image not attached, placement could not be confirmed due to current cardiac rhythm.       Scheduled Meds:  atorvastatin  20 mg Oral QHS   Gerhardt's butt cream   Topical BID   heparin injection (subcutaneous)  5,000 Units Subcutaneous Q8H   insulin aspart  0-9 Units Subcutaneous TID WC   nystatin   Topical BID   vancomycin  125 mg Oral QID   Continuous Infusions:  sodium chloride     sodium chloride 250 mL (07/26/18 0932)   amiodarone     norepinephrine (LEVOPHED) Adult infusion 4 mcg/min (07/26/18 1022)   piperacillin-tazobactam (ZOSYN)  IV      sodium bicarbonate (isotonic) infusion in sterile water Stopped (07/26/18 1011)     LOS: 2 days    Time spent: 25 mins.More than 50% of that time was spent in counseling and/or coordination of care.      Shelly Coss, MD [facility] Pager [phone]  If 7PM-7AM, please contact night-coverage [email or web address] Password [facility] 07/26/2018, 11:20 AM

## 2018-07-26 NOTE — Progress Notes (Signed)
PCCM Interval Note  Family Facetime meeting held. Patient confirmed DNR/DNI status with family. OK with pressors and central line placement.  Rodman Pickle, M.D. [facility] Pulmonary/Critical Care Medicine Pager: [phone] After hours pager: [phone]

## 2018-07-27 DIAGNOSIS — N170 Acute kidney failure with tubular necrosis: Secondary | ICD-10-CM

## 2018-07-27 DIAGNOSIS — K529 Noninfective gastroenteritis and colitis, unspecified: Secondary | ICD-10-CM

## 2018-07-27 DIAGNOSIS — R571 Hypovolemic shock: Secondary | ICD-10-CM

## 2018-07-27 LAB — COMPREHENSIVE METABOLIC PANEL
ALT: 11 U/L (ref 0–44)
ALT: 12 U/L (ref 0–44)
ALT: 13 U/L (ref 0–44)
AST: 19 U/L (ref 15–41)
AST: 22 U/L (ref 15–41)
AST: 25 U/L (ref 15–41)
Albumin: 1.5 g/dL — ABNORMAL LOW (ref 3.5–5.0)
Albumin: 1.6 g/dL — ABNORMAL LOW (ref 3.5–5.0)
Albumin: 1.6 g/dL — ABNORMAL LOW (ref 3.5–5.0)
Alkaline Phosphatase: 49 U/L (ref 38–126)
Alkaline Phosphatase: 55 U/L (ref 38–126)
Alkaline Phosphatase: 60 U/L (ref 38–126)
Anion gap: 15 (ref 5–15)
Anion gap: 20 — ABNORMAL HIGH (ref 5–15)
Anion gap: 16 — ABNORMAL HIGH (ref 5–15)
BUN: 43 mg/dL — ABNORMAL HIGH (ref 8–23)
BUN: 47 mg/dL — ABNORMAL HIGH (ref 8–23)
BUN: 48 mg/dL — ABNORMAL HIGH (ref 8–23)
CO2: 18 mmol/L — ABNORMAL LOW (ref 22–32)
CO2: 17 mmol/L — ABNORMAL LOW (ref 22–32)
CO2: 25 mmol/L (ref 22–32)
Calcium: 5.6 mg/dL — CL (ref 8.9–10.3)
Calcium: 5.5 mg/dL — CL (ref 8.9–10.3)
Calcium: 5.8 mg/dL — CL (ref 8.9–10.3)
Chloride: 105 mmol/L (ref 98–111)
Chloride: 101 mmol/L (ref 98–111)
Chloride: 98 mmol/L (ref 98–111)
Creatinine, Ser: 3.05 mg/dL — ABNORMAL HIGH (ref 0.44–1.00)
Creatinine, Ser: 3.35 mg/dL — ABNORMAL HIGH (ref 0.44–1.00)
Creatinine, Ser: 3.45 mg/dL — ABNORMAL HIGH (ref 0.44–1.00)
GFR calc Af Amer: 16 mL/min — ABNORMAL LOW (ref >60)
GFR calc Af Amer: 14 mL/min — ABNORMAL LOW (ref >60)
GFR calc Af Amer: 14 mL/min — ABNORMAL LOW (ref >60)
GFR calc non Af Amer: 14 mL/min — ABNORMAL LOW (ref >60)
GFR calc non Af Amer: 12 mL/min — ABNORMAL LOW (ref >60)
GFR calc non Af Amer: 12 mL/min — ABNORMAL LOW (ref >60)
Glucose, Bld: 182 mg/dL — ABNORMAL HIGH (ref 70–99)
Glucose, Bld: 173 mg/dL — ABNORMAL HIGH (ref 70–99)
Glucose, Bld: 101 mg/dL — ABNORMAL HIGH (ref 70–99)
Potassium: 3.5 mmol/L (ref 3.5–5.1)
Potassium: 3.4 mmol/L — ABNORMAL LOW (ref 3.5–5.1)
Potassium: 3.3 mmol/L — ABNORMAL LOW (ref 3.5–5.1)
Sodium: 138 mmol/L (ref 135–145)
Sodium: 138 mmol/L (ref 135–145)
Sodium: 139 mmol/L (ref 135–145)
Total Bilirubin: 0.2 mg/dL — ABNORMAL LOW (ref 0.3–1.2)
Total Bilirubin: 0.3 mg/dL (ref 0.3–1.2)
Total Bilirubin: 0.5 mg/dL (ref 0.3–1.2)
Total Protein: 4 g/dL — ABNORMAL LOW (ref 6.5–8.1)
Total Protein: 4.1 g/dL — ABNORMAL LOW (ref 6.5–8.1)
Total Protein: 4.1 g/dL — ABNORMAL LOW (ref 6.5–8.1)

## 2018-07-27 LAB — GLUCOSE, CAPILLARY
Glucose-Capillary: 169 mg/dL — ABNORMAL HIGH (ref 70–99)
Glucose-Capillary: 153 mg/dL — ABNORMAL HIGH (ref 70–99)
Glucose-Capillary: 126 mg/dL — ABNORMAL HIGH (ref 70–99)
Glucose-Capillary: 100 mg/dL — ABNORMAL HIGH (ref 70–99)

## 2018-07-27 LAB — CBC
HCT: 33.3 % — ABNORMAL LOW (ref 36.0–46.0)
Hemoglobin: 11.1 g/dL — ABNORMAL LOW (ref 12.0–15.0)
MCH: 29.4 pg (ref 26.0–34.0)
MCHC: 33.3 g/dL (ref 30.0–36.0)
MCV: 88.1 fL (ref 80.0–100.0)
Platelets: 246 10*3/uL (ref 150–400)
RBC: 3.78 MIL/uL — ABNORMAL LOW (ref 3.87–5.11)
RDW: 14.6 % (ref 11.5–15.5)
WBC: 16.5 10*3/uL — ABNORMAL HIGH (ref 4.0–10.5)
nRBC: 1.1 % — ABNORMAL HIGH (ref 0.0–0.2)

## 2018-07-27 MED ORDER — CALCIUM GLUCONATE-NACL 2-0.675 GM/100ML-% IV SOLN
2.0000 g | Freq: Once | INTRAVENOUS | Status: AC
  Administered 2018-07-27: 2000 mg via INTRAVENOUS
  Filled 2018-07-27: qty 100

## 2018-07-27 MED ORDER — LACTATED RINGERS IV BOLUS
500.0000 mL | Freq: Once | INTRAVENOUS | Status: AC
  Administered 2018-07-27: 500 mL via INTRAVENOUS

## 2018-07-27 MED ORDER — VANCOMYCIN HCL 500 MG IV SOLR
500.0000 mg | Freq: Four times a day (QID) | Status: DC
  Administered 2018-07-27 – 2018-07-29 (×8): 500 mg via RECTAL
  Filled 2018-07-27 (×14): qty 500

## 2018-07-27 MED ORDER — VANCOMYCIN HCL 500 MG IV SOLR: 500.0000 mg | Freq: Four times a day (QID) | Status: DC

## 2018-07-27 MED ORDER — LORAZEPAM 0.5 MG PO TABS
0.5000 mg | ORAL_TABLET | Freq: Four times a day (QID) | ORAL | Status: DC | PRN
  Administered 2018-07-27 – 2018-07-29 (×4): 0.5 mg via SUBLINGUAL
  Filled 2018-07-27 (×4): qty 1

## 2018-07-27 MED ORDER — POTASSIUM CHLORIDE 10 MEQ/50ML IV SOLN
10.0000 meq | INTRAVENOUS | Status: AC
  Administered 2018-07-27 (×2): 10 meq via INTRAVENOUS
  Filled 2018-07-27 (×2): qty 50

## 2018-07-27 MED ORDER — AMIODARONE IV BOLUS ONLY 150 MG/100ML
150.0000 mg | Freq: Once | INTRAVENOUS | Status: AC
  Administered 2018-07-27: 150 mg via INTRAVENOUS

## 2018-07-27 NOTE — Progress Notes (Signed)
CRITICAL VALUE ALERT  Critical Value:  Ca 5.6  Date & Time Notied:  5/24 0415  Provider Notified: E-link  Orders Received/Actions taken: Awaiting new orders

## 2018-07-27 NOTE — Progress Notes (Signed)
CRITICAL VALUE ALERT  Critical Value:  Calcium 5.8  Date & Time Notied:  07/27/18 2120  Provider Notified: E-link  Orders Received/Actions taken: no new orders at this time

## 2018-07-27 NOTE — Progress Notes (Signed)
CRITICAL VALUE ALERT  Critical Value:  Ca+ 5.5  Date & Time Notied: 5/24  1143  Provider Notified: Dr Loanne Drilling  Orders Received/Actions taken: waiting for call back and new orders

## 2018-07-27 NOTE — Progress Notes (Signed)
eLink Physician-Brief Progress Note Patient Name: Shelby Walker DOB: [birth year] MRN: [id number]   Date of Service  07/27/2018  HPI/Events of Note  Corrected calcium for low albumin ok.   eICU Interventions  Get ionized calcium level, if low will replace.      Intervention Category Minor Interventions: Electrolytes abnormality - evaluation and management  Elmer Sow 07/27/2018, 4:15 AM

## 2018-07-27 NOTE — Progress Notes (Signed)
NAME:  Shelby Walker, MRN:  [id number], DOB:  09/26/1933, LOS: 3 ADMISSION DATE:  07/24/2018, CONSULTATION DATE:  07/26/18 REFERRING MD:  Shelly Coss, MD CHIEF COMPLAINT:  Hypovolemic shock  Brief History   85 year old female 85 year old female who presented with nausea, vomiting and diarrhea x 3 weeks and admitted with sepsis secondary to C. Diff colitis. Developed hypovolemic vs septic shock. PCCM consulted for further management  History of present illness   85 year old female who presented with nausea, vomiting and diarrhea x 3 weeks and admitted with sepsis secondary to colitis. Prior to admission, patient was seen in urgent care clinic and diagnosed with infectious colitis and treated with 7 day course of cipro and flagyl however diarrhea persistent with ~10 bowel movements daily. She presented to [place] point and CT A/P demonstrated diffuse colitis without perforation or abscess. C.diff positive. Labs also significant for AKI.   Transferred to SDU overnight. This morning patient hypotensive and in profound metabolic acidosis. PCCM consulted for management of critical illness.  Past Medical History  DM2, HTN, HLD, GERD, RA  Significant Hospital Events   5/21 Admitted 5/23 Transferred to ICU for hypotension requiring vasopressors  Consults:  PCCM 5/23  Procedures:    Significant Diagnostic Tests:  CT A/P Diffuse colitis without perforation or abscess formation  Micro Data:  C.diff - Positive  Antimicrobials:  Levofloxacin 5/21 once Flagyl 5/21>5/22 Vanc 5/22 > Zosyn 5/23>  Interim history/subjective:  Placed CVC yesterday and started pressors. Now weaned off pressors after receiving fluid resuscitation. Reports nausea and PO intolerance  Objective   Blood pressure (!) 130/51, pulse 98, temperature 97.6 F (36.4 C), temperature source Oral, resp. rate (!) 21, height 5' 1.5" (1.562 m), weight 57.2 kg, SpO2 95 %.        Intake/Output Summary (Last 24 hours)  at 07/27/2018 0758 Last data filed at 07/27/2018 0500 Gross per 24 hour  Intake 3986.35 ml  Output 240 ml  Net 3746.35 ml   Filed Weights   07/24/18 1958  Weight: 57.2 kg   Physical Exam: General: Elderly female, in mild discomfort HENT: [place], AT, OP clear, MMM Eyes: EOMI, no scleral icterus Respiratory: Clear to auscultation bilaterally.  No crackles, wheezing or rales Cardiovascular: RRR, -M/R/G, no JVD GI: BS+, soft, nontender, tender to palpation in left side Extremities:-Edema,-tenderness Neuro: AAO x4, CNII-XII grossly intact Skin: Intact, no rashes or bruising Psych: Normal mood, normal affect GU: Foley in place  Resolved Hospital Problem list     Assessment & Plan:   Hypovolemic and septic shock secondary to C. Difficile colitis - improving Off pressors Follow-up blood cultures Continue Zosyn Start Vanc enema due to PO intolerance DC PPI Hold home anti-hypertensives  Metabolic acidosis secondary to bicarbonate loss in setting of diarrhea - improving Sodium bicarbonate gtt Serial BMP  AKI secondary ATN - minimal UOP, electrolytes acceptable No indication for dialysis Monitor UOP/Cr  Hypocalcemia Follow-up ionized calcium Replete as needed  RA on chronic prednisone therapy Continue home dose prednisone  Best practice:  Diet: CLD, advance as tolerated Pain/Anxiety/Delirium protocol (if indicated): -- VAP protocol (if indicated): -- DVT prophylaxis: heparin GI prophylaxis:  Glucose control: Trend CBG Mobility: BR Code Status: DNR. Patient confirmed code status with family on Facetime. Family Communication: Updated patient and daughter via telephone Disposition: ICU  Labs   CBC: Recent Labs  Lab 07/24/18 1120 07/25/18 0614 07/26/18 0249 07/27/18 0330  WBC 15.3* 13.9* 23.2* 16.5*  NEUTROABS 12.6*  --  18.3*  --   HGB 11.4* 11.6* 12.8 11.1*  HCT 36.4 36.8 43.4 33.3*  MCV 92.9 93.2 99.5 88.1  PLT 247 240 317 378    Basic Metabolic Panel:  Recent Labs  Lab 07/24/18 1120 07/24/18 2339 07/25/18 0614 07/26/18 0249 07/26/18 1500 07/27/18 0330  NA 135  --  136 138 137 138  K 4.2  --  4.2 4.6 3.8 3.5  CL 105  --  111 111 112* 105  CO2 19*  --  13* 9* 12* 18*  GLUCOSE 155*  --  133* 191* 204* 182*  BUN 23  --  27* 40* 42* 43*  CREATININE 1.22*  --  1.44* 2.84* 2.92* 3.05*  CALCIUM 7.4*  --  6.4* 6.2* 5.2* 5.6*  MG  --  1.8  --  2.1  --   --   PHOS  --   --   --  6.1*  --   --    GFR: Estimated Creatinine Clearance: 10.8 mL/min (A) (by C-G formula based on SCr of 3.05 mg/dL (H)). Recent Labs  Lab 07/24/18 1120 07/24/18 2111 07/24/18 2339 07/25/18 0614 07/26/18 0249 07/26/18 0857 07/26/18 1128 07/27/18 0330  WBC 15.3*  --   --  13.9* 23.2*  --   --  16.5*  LATICACIDVEN  --  1.4 1.0  --   --  2.7* 1.7  --     Liver Function Tests: Recent Labs  Lab 07/24/18 1120 07/25/18 0614 07/26/18 1500 07/27/18 0330  AST 14* 13* 15 19  ALT 11 11 11 11   ALKPHOS 41 35* 72 49  BILITOT 1.0 1.2 <0.1* 0.2*  PROT 5.9* 4.9* 3.8* 4.0*  ALBUMIN 2.9* 2.2* 1.5* 1.5*   Recent Labs  Lab 07/24/18 1120  LIPASE 23   No results for input(s): AMMONIA in the last 168 hours.  ABG No results found for: PHART, PCO2ART, PO2ART, HCO3, TCO2, ACIDBASEDEF, O2SAT   Coagulation Profile: No results for input(s): INR, PROTIME in the last 168 hours.  Cardiac Enzymes: Recent Labs  Lab 07/24/18 1120 07/25/18 1719  TROPONINI <0.03 <0.03    HbA1C: Hgb A1c MFr Bld  Date/Time Value Ref Range Status  07/24/2018 09:11 PM 8.8 (H) 4.8 - 5.6 % Final    Comment:    (NOTE) Pre diabetes:          5.7%-6.4% Diabetes:              >6.4% Glycemic control for   <7.0% adults with diabetes     CBG: Recent Labs  Lab 07/26/18 0719 07/26/18 1157 07/26/18 1540 07/26/18 1725 07/26/18 2209  GLUCAP 195* 179* 184* 214* 144*   Critical care time: 31 min    The patient is critically ill with multiple organ systems failure and requires high  complexity decision making for assessment and support, frequent evaluation and titration of therapies, application of advanced monitoring technologies and extensive interpretation of multiple databases.   Critical Care Time devoted to patient care services described in this note is 31 Minutes. This time reflects time of care of this signee Dr. Rodman Pickle. This critical care time does not reflect procedure time, or teaching time or supervisory time of PA/NP/Med student/Med Resident etc but could involve care discussion time.  Rodman Pickle, M.D. [facility] Pulmonary/Critical Care Medicine Pager: [phone] After hours pager: [phone]

## 2018-07-27 NOTE — Progress Notes (Signed)
Pt in a-fib, EKG done. Page Dr Loanne Drilling, new order for amio bolus 150mg  given

## 2018-07-27 NOTE — Progress Notes (Signed)
Dr Loanne Drilling made aware of pt's decreased urine output and critical labs, see new orders

## 2018-07-27 NOTE — Progress Notes (Signed)
OT Cancellation Note  Patient Details Name: LIBRA GATZ MRN: [id number] DOB: 05-22-1933   Cancelled Treatment:    Reason Eval/Treat Not Completed: Other (comment) Spoke with RN who stated pt is having severe nausea and can hardly tolerate holding head up or rolling in bed. Due to poor PO intake pt will be starting vancomycin enemas, RN requesting hold until this improves. OT will continue to follow to initiate services as pt available and appropriate.   Zenovia Jarred, MSOT, OTR/L [facility] OT/ Acute Relief OT [facility] Office: [place] 07/27/2018, 10:02 AM

## 2018-07-27 NOTE — Progress Notes (Signed)
[place] Progress Note Patient Name: Shelby Walker DOB: [birth year] MRN: [id number]   Date of Service  07/27/2018  HPI/Events of Note  Multiple issues: K+ = 3.3 and Creatinine = 3.45 and 2. Calcium = 5.8 which corrects to 7.72 given albumin = 1.6.   eICU Interventions  Will order: 1. Replace Ca++. 2. Will now replace K+ give severe renal dysfunction.      Intervention Category Major Interventions: Electrolyte abnormality - evaluation and management  Lysle Dingwall 07/27/2018, 9:49 PM

## 2018-07-27 NOTE — Progress Notes (Signed)
PT Cancellation Note  Patient Details Name: Shelby Walker MRN: [id number] DOB: 03/07/1933   Cancelled Treatment:     PT order received but eval deferred.  RN reports pt with extreme nausea.  WIll follow.  [place] Pager [phone] Office [phone]    [facility] 07/27/2018, 9:57 AM

## 2018-07-28 LAB — BASIC METABOLIC PANEL
Anion gap: 17 — ABNORMAL HIGH (ref 5–15)
BUN: 48 mg/dL — ABNORMAL HIGH (ref 8–23)
CO2: 28 mmol/L (ref 22–32)
Calcium: 5.9 mg/dL — CL (ref 8.9–10.3)
Chloride: 92 mmol/L — ABNORMAL LOW (ref 98–111)
Creatinine, Ser: 3.69 mg/dL — ABNORMAL HIGH (ref 0.44–1.00)
GFR calc Af Amer: 12 mL/min — ABNORMAL LOW (ref >60)
GFR calc non Af Amer: 11 mL/min — ABNORMAL LOW (ref >60)
Glucose, Bld: 108 mg/dL — ABNORMAL HIGH (ref 70–99)
Potassium: 3.2 mmol/L — ABNORMAL LOW (ref 3.5–5.1)
Sodium: 137 mmol/L (ref 135–145)

## 2018-07-28 LAB — GLUCOSE, CAPILLARY
Glucose-Capillary: 102 mg/dL — ABNORMAL HIGH (ref 70–99)
Glucose-Capillary: 124 mg/dL — ABNORMAL HIGH (ref 70–99)
Glucose-Capillary: 230 mg/dL — ABNORMAL HIGH (ref 70–99)
Glucose-Capillary: 236 mg/dL — ABNORMAL HIGH (ref 70–99)
Glucose-Capillary: 233 mg/dL — ABNORMAL HIGH (ref 70–99)

## 2018-07-28 LAB — CBC
HCT: 32 % — ABNORMAL LOW (ref 36.0–46.0)
Hemoglobin: 10.7 g/dL — ABNORMAL LOW (ref 12.0–15.0)
MCH: 29 pg (ref 26.0–34.0)
MCHC: 33.4 g/dL (ref 30.0–36.0)
MCV: 86.7 fL (ref 80.0–100.0)
Platelets: 216 10*3/uL (ref 150–400)
RBC: 3.69 MIL/uL — ABNORMAL LOW (ref 3.87–5.11)
RDW: 14.5 % (ref 11.5–15.5)
WBC: 19.7 10*3/uL — ABNORMAL HIGH (ref 4.0–10.5)
nRBC: 0.3 % — ABNORMAL HIGH (ref 0.0–0.2)

## 2018-07-28 MED ORDER — HYDROCORTISONE NA SUCCINATE PF 100 MG IJ SOLR
50.0000 mg | Freq: Four times a day (QID) | INTRAMUSCULAR | Status: DC
  Administered 2018-07-28 – 2018-07-30 (×8): 50 mg via INTRAVENOUS
  Filled 2018-07-28 (×9): qty 2

## 2018-07-28 MED ORDER — DEXTROSE IN LACTATED RINGERS 5 % IV SOLN
INTRAVENOUS | Status: DC
  Administered 2018-07-28 – 2018-07-29 (×3): via INTRAVENOUS

## 2018-07-28 MED ORDER — POTASSIUM CHLORIDE 10 MEQ/50ML IV SOLN
10.0000 meq | INTRAVENOUS | Status: AC
  Administered 2018-07-28 (×3): 10 meq via INTRAVENOUS
  Filled 2018-07-28 (×3): qty 50

## 2018-07-28 MED ORDER — CALCIUM GLUCONATE-NACL 1-0.675 GM/50ML-% IV SOLN
1.0000 g | Freq: Once | INTRAVENOUS | Status: AC
  Administered 2018-07-28: 1000 mg via INTRAVENOUS
  Filled 2018-07-28: qty 50

## 2018-07-28 NOTE — Progress Notes (Signed)
PT Cancellation Note  Patient Details Name: Shelby Walker MRN: [id number] DOB: 12/08/1933   Cancelled Treatment:    Reason Eval/Treat Not Completed: Medical issues which prohibited therapy(nausea)   Kenyon Ana 07/28/2018, 12:35 PM

## 2018-07-28 NOTE — Progress Notes (Signed)
NAME:  Shelby Walker, MRN:  [id number], DOB:  Aug 30, 1933, LOS: 4 ADMISSION DATE:  07/24/2018, CONSULTATION DATE:  07/26/18 REFERRING MD:  Shelly Coss, MD CHIEF COMPLAINT:  Hypovolemic shock  Brief History   85 year old female 85 year old female who presented with nausea, vomiting and diarrhea x 3 weeks and admitted with sepsis secondary to C. Diff colitis. Developed hypovolemic vs septic shock. PCCM consulted for further management  Past Medical History  DM2, HTN, HLD, GERD, RA  Significant Hospital Events   5/21 Admitted 5/23 Transferred to ICU for hypotension requiring vasopressors; DNR/DNI 5/24 off pressors; a fib with RVR, started amiodarone  Consults:    Procedures:  Lt IJ CVL 5/23 >>  Significant Diagnostic Tests:  CT abd/pelvis 5/21 >> diffuse diverticular change in sigmoid colon, pericolonic inflammatory changes, small hiatal hernia Echo 5/23 >> EF 60 to 65%, impaired LV relaxation, moderate LA dilation  Micro Data:  COVID 5/21 >> negative C diff 5/21 >> Positive Ag and toxin GI PCR 5/21 >> negative Blood 5/23 >>   Antimicrobials:  Flagyl 5/21 >> 5/23 Zosyn 5/23 >> 5/25 Oral vancomycin 5/22 >> 5/24 Vancomycin enema 5/24 >>  Interim history/subjective:  Has nausea and unable to keep pills down.  Feels weak.  Objective   Blood pressure (!) 96/47, pulse 91, temperature 98.1 F (36.7 C), temperature source Axillary, resp. rate 16, height 5' 1.5" (1.562 m), weight 57.2 kg, SpO2 97 %.        Intake/Output Summary (Last 24 hours) at 07/28/2018 0817 Last data filed at 07/28/2018 0700 Gross per 24 hour  Intake 4091.93 ml  Output 85 ml  Net 4006.93 ml   Filed Weights   07/24/18 1958  Weight: 57.2 kg   Physical Exam:  General - alert Eyes - pupils reactive ENT - no sinus tenderness, no stridor Cardiac - irregular Chest - equal breath sounds b/l, no wheezing or rales Abdomen - soft, non tender, + bowel sounds Extremities - changes of RA Skin - no rashes  Neuro - somnolent   Resolved Hospital Problem list   Septic shock, hypovolemic shock, metabolic acidosis  Assessment & Plan:   C diff colitis. Plan - continue vancomycin enemas until she is able to take pills - d/c zosyn  Acute renal failure with ATN >> baseline creatinine 1.49 from 07/09/18. Hypokalemia, hypocalcemia. Plan - continue IV fluid; d/c HCO3 from IV fluids - f/u BMET - replace electrolytes as needed  Moderate protein calorie malnutrition. Plan - advance diet as able  RA on chronic prednisone therapy. Plan - stress dose steroids for now - hold outpt enbrel  Anemia of critical illness. Plan - f/u CBC - transfuse for Hb < 7  New onset A fib with RVR in setting of sepsis. Plan - continue amiodarone for now  Nausea. Plan - prn zofran, ativan  Hx of HTN, HLD. Plan - hold outpt ASA, fish oil, lipitor, cardizem, toprol  DM type II. Plan - SSI  Best practice:  Diet: clear liquids DVT prophylaxis: heparin GI prophylaxis: not indicated Mobility: PT/OT Code Status: DNR/DNI Disposition: ICU  Labs    CMP Latest Ref Rng & Units 07/28/2018 07/27/2018 07/27/2018  Glucose 70 - 99 mg/dL 108(H) 101(H) 173(H)  BUN 8 - 23 mg/dL 48(H) 48(H) 47(H)  Creatinine 0.44 - 1.00 mg/dL 3.69(H) 3.45(H) 3.35(H)  Sodium 135 - 145 mmol/L 137 139 138  Potassium 3.5 - 5.1 mmol/L 3.2(L) 3.3(L) 3.4(L)  Chloride 98 - 111 mmol/L 92(L) 98 101  CO2 22 - 32 mmol/L 28 25 17(L)  Calcium 8.9 - 10.3 mg/dL 5.9(LL) 5.8(LL) 5.5(LL)  Total Protein 6.5 - 8.1 g/dL - 4.1(L) 4.1(L)  Total Bilirubin 0.3 - 1.2 mg/dL - 0.5 0.3  Alkaline Phos 38 - 126 U/L - 60 55  AST 15 - 41 U/L - 25 22  ALT 0 - 44 U/L - 13 12   CBC Latest Ref Rng & Units 07/28/2018 07/27/2018 07/26/2018  WBC 4.0 - 10.5 K/uL 19.7(H) 16.5(H) 23.2(H)  Hemoglobin 12.0 - 15.0 g/dL 10.7(L) 11.1(L) 12.8  Hematocrit 36.0 - 46.0 % 32.0(L) 33.3(L) 43.4  Platelets 150 - 400 K/uL 216 246 317    CBG (last 3)  Recent Labs    07/27/18  1639 07/27/18 2140 07/28/18 0752  GLUCAP 126* 100* 102*   Chesley Mires, MD [facility] Pulmonary/Critical Care 07/28/2018, 8:37 AM

## 2018-07-28 NOTE — Progress Notes (Signed)
CRITICAL VALUE ALERT  Critical Value: Ca+ 5.9  Date & Time Notied:  5/25 0813  Provider Notified: Dr Halford Chessman  Orders Received/Actions taken: see new order for calcium

## 2018-07-28 NOTE — Progress Notes (Signed)
OT Cancellation Note  Patient Details Name: Shelby Walker MRN: [id number] DOB: 22-Aug-1933   Cancelled Treatment:    Reason Eval/Treat Not Completed: Other (comment)  Medical issues which prohibited therapy(nausea)   [place], Mickel Baas, [place] [phone]    07/28/2018, 1:17 PM

## 2018-07-29 ENCOUNTER — Inpatient Hospital Stay (HOSPITAL_COMMUNITY): Payer: Medicare Other

## 2018-07-29 ENCOUNTER — Telehealth: Payer: Self-pay | Admitting: General Surgery

## 2018-07-29 LAB — CBC
HCT: 33.4 % — ABNORMAL LOW (ref 36.0–46.0)
Hemoglobin: 10.7 g/dL — ABNORMAL LOW (ref 12.0–15.0)
MCH: 28.5 pg (ref 26.0–34.0)
MCHC: 32 g/dL (ref 30.0–36.0)
MCV: 88.8 fL (ref 80.0–100.0)
Platelets: 173 10*3/uL (ref 150–400)
RBC: 3.76 MIL/uL — ABNORMAL LOW (ref 3.87–5.11)
RDW: 14.5 % (ref 11.5–15.5)
WBC: 22.2 10*3/uL — ABNORMAL HIGH (ref 4.0–10.5)
nRBC: 0.2 % (ref 0.0–0.2)

## 2018-07-29 LAB — BASIC METABOLIC PANEL
Anion gap: 17 — ABNORMAL HIGH (ref 5–15)
BUN: 53 mg/dL — ABNORMAL HIGH (ref 8–23)
CO2: 26 mmol/L (ref 22–32)
Calcium: 6.1 mg/dL — CL (ref 8.9–10.3)
Chloride: 92 mmol/L — ABNORMAL LOW (ref 98–111)
Creatinine, Ser: 3.99 mg/dL — ABNORMAL HIGH (ref 0.44–1.00)
GFR calc Af Amer: 11 mL/min — ABNORMAL LOW (ref >60)
GFR calc non Af Amer: 10 mL/min — ABNORMAL LOW (ref >60)
Glucose, Bld: 321 mg/dL — ABNORMAL HIGH (ref 70–99)
Potassium: 3.7 mmol/L (ref 3.5–5.1)
Sodium: 135 mmol/L (ref 135–145)

## 2018-07-29 LAB — CALCIUM, IONIZED: Calcium, Ionized, Serum: 3.6 mg/dL — ABNORMAL LOW (ref 4.5–5.6)

## 2018-07-29 LAB — TROPONIN I
Troponin I: 0.03 ng/mL (ref <0.03)
Troponin I: <0.03 ng/mL (ref <0.03)
Troponin I: 0.03 ng/mL (ref <0.03)

## 2018-07-29 LAB — PHOSPHORUS: Phosphorus: 5.8 mg/dL — ABNORMAL HIGH (ref 2.5–4.6)

## 2018-07-29 LAB — GLUCOSE, CAPILLARY
Glucose-Capillary: 314 mg/dL — ABNORMAL HIGH (ref 70–99)
Glucose-Capillary: 266 mg/dL — ABNORMAL HIGH (ref 70–99)
Glucose-Capillary: 172 mg/dL — ABNORMAL HIGH (ref 70–99)
Glucose-Capillary: 226 mg/dL — ABNORMAL HIGH (ref 70–99)

## 2018-07-29 LAB — MAGNESIUM: Magnesium: 1.9 mg/dL (ref 1.7–2.4)

## 2018-07-29 MED ORDER — METOPROLOL TARTRATE 12.5 MG HALF TABLET
12.5000 mg | ORAL_TABLET | Freq: Two times a day (BID) | ORAL | Status: DC
  Administered 2018-07-29: 12.5 mg via ORAL
  Filled 2018-07-29: qty 1

## 2018-07-29 MED ORDER — CALCIUM GLUCONATE-NACL 1-0.675 GM/50ML-% IV SOLN
1.0000 g | Freq: Once | INTRAVENOUS | Status: AC
  Administered 2018-07-29: 1000 mg via INTRAVENOUS
  Filled 2018-07-29: qty 50

## 2018-07-29 MED ORDER — ALBUTEROL SULFATE (2.5 MG/3ML) 0.083% IN NEBU
2.5000 mg | INHALATION_SOLUTION | RESPIRATORY_TRACT | Status: DC | PRN
  Administered 2018-07-29 – 2018-07-31 (×2): 2.5 mg via RESPIRATORY_TRACT
  Filled 2018-07-29 (×2): qty 3

## 2018-07-29 MED ORDER — VANCOMYCIN 50 MG/ML ORAL SOLUTION
125.0000 mg | Freq: Four times a day (QID) | ORAL | Status: AC
  Administered 2018-07-29 – 2018-08-04 (×21): 125 mg via ORAL
  Filled 2018-07-29 (×26): qty 2.5

## 2018-07-29 MED ORDER — NITROGLYCERIN 0.4 MG SL SUBL
0.4000 mg | SUBLINGUAL_TABLET | SUBLINGUAL | Status: DC | PRN
  Administered 2018-07-29 – 2018-08-05 (×4): 0.4 mg via SUBLINGUAL
  Filled 2018-07-29 (×4): qty 1

## 2018-07-29 MED ORDER — DEXMEDETOMIDINE HCL IN NACL 200 MCG/50ML IV SOLN: 0.2000 ug/kg/h | INTRAVENOUS | Status: DC

## 2018-07-29 MED ORDER — ATORVASTATIN CALCIUM 10 MG PO TABS: 20.0000 mg | ORAL_TABLET | Freq: Every day | ORAL | Status: DC

## 2018-07-29 MED ORDER — ASPIRIN EC 81 MG PO TBEC: 81.0000 mg | DELAYED_RELEASE_TABLET | Freq: Every day | ORAL | Status: DC

## 2018-07-29 MED ORDER — METOPROLOL TARTRATE 5 MG/5ML IV SOLN
2.5000 mg | Freq: Three times a day (TID) | INTRAVENOUS | Status: DC
  Administered 2018-07-29 – 2018-08-01 (×8): 2.5 mg via INTRAVENOUS
  Filled 2018-07-29 (×8): qty 5

## 2018-07-29 MED ORDER — INSULIN GLARGINE 100 UNIT/ML ~~LOC~~ SOLN
10.0000 [IU] | Freq: Every day | SUBCUTANEOUS | Status: DC
  Administered 2018-07-29 – 2018-08-01 (×4): 10 [IU] via SUBCUTANEOUS
  Filled 2018-07-29 (×5): qty 0.1

## 2018-07-29 MED ORDER — POLYVINYL ALCOHOL 1.4 % OP SOLN: 1.0000 [drp] | OPHTHALMIC | Status: DC | PRN

## 2018-07-29 MED ORDER — ASPIRIN 300 MG RE SUPP
150.0000 mg | Freq: Every day | RECTAL | Status: DC
  Administered 2018-07-29: 150 mg via RECTAL
  Filled 2018-07-29: qty 1

## 2018-07-29 NOTE — Progress Notes (Addendum)
OT Cancellation Note  Patient Details Name: BLINDA TUREK MRN: [id number] DOB: [birth year]   Cancelled Treatment:    Reason Eval/Treat Not Completed: Other (comment); spoke with RN who reports pt not doing as well this PM and with increase in confusion levels. Will follow up for OT eval at a later date and as pt is medically able.   Lou Cal, OT [facility] Pager [phone] Office [phone]   Raymondo Band 07/29/2018, 3:09 PM

## 2018-07-29 NOTE — Progress Notes (Signed)
CRITICAL VALUE ALERT  Critical Value:  Troponin 0.03  Date & Time Notied:  07/29/18 6184  Provider Notified: E-link  Orders Received/Actions taken: no new orders at this time

## 2018-07-29 NOTE — Progress Notes (Signed)
NAME:  Shelby Walker, MRN:  [id number], DOB:  December 04, 1933, LOS: 5 ADMISSION DATE:  07/24/2018, CONSULTATION DATE:  07/26/18 REFERRING MD:  Shelly Coss, MD CHIEF COMPLAINT:  Hypovolemic shock  Brief History   85 year old female 85 year old female who presented with nausea, vomiting and diarrhea x 3 weeks and admitted with sepsis secondary to C. Diff colitis. Developed hypovolemic vs septic shock. PCCM consulted for further management  Past Medical History  DM2, HTN, HLD, GERD, RA  Significant Hospital Events   5/21 Admitted 5/23 Transferred to ICU for hypotension requiring vasopressors; DNR/DNI 5/24 off pressors; a fib with RVR, started amiodarone 5/26 sinus rhythm  Consults:    Procedures:  Lt IJ CVL 5/23 >>  Significant Diagnostic Tests:  CT abd/pelvis 5/21 >> diffuse diverticular change in sigmoid colon, pericolonic inflammatory changes, small hiatal hernia Echo 5/23 >> EF 60 to 65%, impaired LV relaxation, moderate LA dilation  Micro Data:  COVID 5/21 >> negative C diff 5/21 >> Positive Ag and toxin GI PCR 5/21 >> negative Blood 5/23 >>   Antimicrobials:  Flagyl 5/21 >> 5/23 Zosyn 5/23 >> 5/25 Oral vancomycin 5/22 >> 5/24 Vancomycin enema 5/24 >> 5/26 Oral vancomycin 5/26 >>  Interim history/subjective:  Strength improved some.  Had wheeze and chest discomfort yesterday evening >> better this morning.  Thinks she would do better with more solid diet.  Abdominal pain improved.  Objective   Blood pressure 101/73, pulse 87, temperature (!) 97 F (36.1 C), temperature source Oral, resp. rate (!) 24, height 5' 1.5" (1.562 m), weight 57.2 kg, SpO2 96 %. CVP:  [8 mmHg-9 mmHg] 9 mmHg      Intake/Output Summary (Last 24 hours) at 07/29/2018 0830 Last data filed at 07/29/2018 0800 Gross per 24 hour  Intake 2645.85 ml  Output 115 ml  Net 2530.85 ml   Filed Weights   07/24/18 1958  Weight: 57.2 kg   Physical Exam:  General - alert Eyes - pupils reactive ENT - no  sinus tenderness, no stridor Cardiac - regular rate/rhythm, no murmur Chest - equal breath sounds b/l, no wheezing or rales Abdomen - soft, non tender, + bowel sounds Extremities - no cyanosis, clubbing, or edema Skin - no rashes Neuro - normal strength, moves extremities, follows commands  CXR (reviewed by me) - Lt base atelectasis  Resolved Hospital Problem list   Septic shock, hypovolemic shock, metabolic acidosis  Assessment & Plan:   C diff colitis. Plan - day 5 of vancomycin >> change back to oral medication on 5/26 to complete 10 day course  Acute renal failure with ATN >> baseline creatinine 1.49 from 07/09/18. Hypokalemia, hypocalcemia. Plan - f/u BMET - monitor urine outpt - replace electrolytes as needed  Moderate protein calorie malnutrition. Plan - advance diet as able  RA on chronic prednisone therapy. Plan - continue stress dose steroids for now - hold outpt enbrel  Anemia of critical illness. Plan - f/u CBC intermittently - transfuse for Hb < 7  New onset A fib with RVR in setting of sepsis. Discussion: Back in sinus rhythm 5/26. Plan - transition off amiodarone gtt  Prolonged QTc. Plan - monitor on tele  Nausea. Plan - prn zofran, ativan  Hx of HTN, HLD. Plan - resume ASA, lipitor, lopressor - hold outpt fish oil, cardizem   DM type II. Plan - SSI - will add 10 units lantus qhs  Best practice:  Diet: full liquid DVT prophylaxis: heparin GI prophylaxis: not indicated Mobility:  PT/OT Code Status: DNR/DNI Disposition: ICU  Will ask [facility] to resume care from 5/27 and PCCM off.  Labs    CMP Latest Ref Rng & Units 07/29/2018 07/28/2018 07/27/2018  Glucose 70 - 99 mg/dL 321(H) 108(H) 101(H)  BUN 8 - 23 mg/dL 53(H) 48(H) 48(H)  Creatinine 0.44 - 1.00 mg/dL 3.99(H) 3.69(H) 3.45(H)  Sodium 135 - 145 mmol/L 135 137 139  Potassium 3.5 - 5.1 mmol/L 3.7 3.2(L) 3.3(L)  Chloride 98 - 111 mmol/L 92(L) 92(L) 98  CO2 22 - 32 mmol/L 26 28  25   Calcium 8.9 - 10.3 mg/dL 6.1(LL) 5.9(LL) 5.8(LL)  Total Protein 6.5 - 8.1 g/dL - - 4.1(L)  Total Bilirubin 0.3 - 1.2 mg/dL - - 0.5  Alkaline Phos 38 - 126 U/L - - 60  AST 15 - 41 U/L - - 25  ALT 0 - 44 U/L - - 13   CBC Latest Ref Rng & Units 07/29/2018 07/28/2018 07/27/2018  WBC 4.0 - 10.5 K/uL 22.2(H) 19.7(H) 16.5(H)  Hemoglobin 12.0 - 15.0 g/dL 10.7(L) 10.7(L) 11.1(L)  Hematocrit 36.0 - 46.0 % 33.4(L) 32.0(L) 33.3(L)  Platelets 150 - 400 K/uL 173 216 246    CBG (last 3)  Recent Labs    07/28/18 1919 07/28/18 2150 07/29/18 0821  [place], MD [place] 07/29/2018, 8:30 AM

## 2018-07-29 NOTE — Telephone Encounter (Signed)
Called patient to prescreen for telephone visit on 07/30/2018. The patients home number has a robo call blocker that would not allow me to connect. Left a voicemail on her mobile to call the office.

## 2018-07-29 NOTE — Progress Notes (Signed)
Inpatient Diabetes Program Recommendations  AACE/ADA: New Consensus Statement on Inpatient Glycemic Control (2015)  Target Ranges:  Prepandial:   less than 140 mg/dL      Peak postprandial:   less than 180 mg/dL (1-2 hours)      Critically ill patients:  140 - 180 mg/dL   Lab Results  Component Value Date   GLUCAP 233 (H) 07/28/2018   HGBA1C 8.8 (H) 07/24/2018    Review of Glycemic Control  Diabetes history: DM2 Outpatient Diabetes medications: Novolin N 20 units QH  Current orders for Inpatient glycemic control: Novolog 0-9 units tidwc  HgbA1C - 8.8% On Solucortef 50 mg Q6H Needs portion of basal insulin.  Inpatient Diabetes Program Recommendations:     Add Lantus 10 units QHS  Will continue to follow.  Thank you. Lorenda Peck, RD, LDN, CDE Inpatient Diabetes Coordinator [phone]

## 2018-07-29 NOTE — Progress Notes (Signed)
Pt becoming more confused and agitated.  Received sublingual ativan earlier, but had paradoxical response.  Has prolonged QTc.  Will try on low dose precedex.  Chesley Mires, MD [facility] Pulmonary/Critical Care 07/29/2018, 3:59 PM

## 2018-07-29 NOTE — Progress Notes (Signed)
Noted to have more nausea and episodes of vomiting.  Will change to NPO except meds.  Will hold lipitor, ASA.  Change lopressor to IV.  Chesley Mires, MD [facility] Pulmonary/Critical Care 07/29/2018, 5:46 PM

## 2018-07-29 NOTE — Progress Notes (Signed)
eLink Physician-Brief Progress Note Patient Name: Shelby Walker DOB: Jul 21, 1933 MRN: [id number]   Date of Service  07/29/2018  HPI/Events of Note  Wheezing - LVEF = 60-65%.   eICU Interventions  Will order: 1. Albuterol 2.5 mg via neb Q 3 hours PRN wheezing or SOB.     Intervention Category Major Interventions: Other:  Lysle Dingwall 07/29/2018, 12:31 AM

## 2018-07-29 NOTE — Progress Notes (Signed)
CRITICAL VALUE ALERT  Critical Value:  Calcium 6.1  Date & Time Notied:  07/29/18 7371  Provider Notified: E-link  Orders Received/Actions taken: no new orders at this time

## 2018-07-29 NOTE — Progress Notes (Signed)
eLink Physician-Brief Progress Note Patient Name: Shelby Walker DOB: [birth year] MRN: [id number]   Date of Service  07/29/2018  HPI/Events of Note  Patient had episode of AFIB with RVR and then c/o chest pain. 12 Lead EKG reveals AFIB with RVR - ventricular rate = 121 and can not r/o inferior infarct, age undertrmined. Now back in NSR wth rate = 90. Demand ischemia?  eICU Interventions  Will order:  1. Cycle Troponin 2. NTG SL PRN. 3. Await AM labs.  4. ASA 450 mg PR now.      Intervention Category Major Interventions: Other:  Lysle Dingwall 07/29/2018, 5:36 AM

## 2018-07-29 NOTE — Progress Notes (Signed)
Patient went into Afib RVR around 0600 and HR in the 120s for 15 mins and went back into NSR. Upon assessment the patient was confused and had 9/10 chest pain and was vomiting. EKG showed critical long QTCs and Afib. 2 sublingual nitro, 1 aspirin suppository, and 2mg  of morphine given. Dr. Levada Schilling made aware, will continue to monitor.

## 2018-07-29 NOTE — Evaluation (Signed)
Physical Therapy Evaluation Patient Details Name: Shelby Walker MRN: [id number] DOB: [birth year] Today's Date: 07/29/2018   History of Present Illness  85 year old female who presented with nausea, vomiting and diarrhea x 3 weeks and admitted with sepsis secondary to C. Diff colitis.  PMHx of DM, HTN, GERD, RA  Clinical Impression  Pt admitted with above diagnosis. Pt currently with functional limitations due to the deficits listed below (see PT Problem List).  Pt will benefit from skilled PT to increase their independence and safety with mobility to allow discharge to the venue listed below.  Pt assisted with bed mobility however not able to tolerate sitting EOB due to chest pain, nausea and small emesis.  Pt has not been mobilizing since admission and appears to have generalized weakness.  Pt may need SNF upon d/c however will continue to update d/c recommendations as pt progresses.     Follow Up Recommendations SNF;Supervision/Assistance - 24 hour    Equipment Recommendations  None recommended by PT    Recommendations for Other Services       Precautions / Restrictions Precautions Precautions: Fall Precaution Comments: multiple lines Restrictions Weight Bearing Restrictions: No      Mobility  Bed Mobility Overal bed mobility: Needs Assistance Bed Mobility: Supine to Sit;Sit to Supine     Supine to sit: Max assist;HOB elevated Sit to supine: Mod assist   General bed mobility comments: verbal cues for technique, encouraged assisting however pt with increased chest pain and nausea, pt with small emesis episode upon sitting and unable to tolerate so requested return to supine (RN aware)  Transfers                    Ambulation/Gait                Stairs            Wheelchair Mobility    Modified Rankin (Stroke Patients Only)       Balance Overall balance assessment: Needs assistance Sitting-balance support: No upper extremity supported;Feet  unsupported Sitting balance-Leahy Scale: Fair                                       Pertinent Vitals/Pain Pain Assessment: Faces Faces Pain Scale: Hurts whole lot Pain Location: chest (had been having this pain, RN aware) Pain Intervention(s): Monitored during session;Repositioned    Home Living Family/patient expects to be discharged to:: Private residence Living Arrangements: Spouse/significant other   Type of Home: House       Home Layout: Able to live on main level with bedroom/bathroom Home Equipment: Walker - 2 wheels;Cane - single point      Prior Function Level of Independence: Independent               Hand Dominance        Extremity/Trunk Assessment   Upper Extremity Assessment Upper Extremity Assessment: Generalized weakness(hands with deformity from RA)    Lower Extremity Assessment Lower Extremity Assessment: Generalized weakness       Communication   Communication: No difficulties  Cognition Arousal/Alertness: Awake/alert Behavior During Therapy: WFL for tasks assessed/performed Overall Cognitive Status: Within Functional Limits for tasks assessed  General Comments      Exercises     Assessment/Plan    PT Assessment Patient needs continued PT services  PT Problem List Decreased strength;Decreased mobility;Decreased activity tolerance;Decreased balance;Decreased knowledge of use of DME       PT Treatment Interventions DME instruction;Therapeutic activities;Gait training;Therapeutic exercise;Patient/family education;Stair training;Functional mobility training;Balance training;Wheelchair mobility training    PT Goals (Current goals can be found in the Care Plan section)  Acute Rehab PT Goals PT Goal Formulation: With patient Time For Goal Achievement: 08/05/18 Potential to Achieve Goals: Good    Frequency Min 3X/week   Barriers to discharge         Co-evaluation               AM-PAC PT "6 Clicks" Mobility  Outcome Measure Help needed turning from your back to your side while in a flat bed without using bedrails?: A Lot Help needed moving from lying on your back to sitting on the side of a flat bed without using bedrails?: A Lot Help needed moving to and from a bed to a chair (including a wheelchair)?: Total Help needed standing up from a chair using your arms (e.g., wheelchair or bedside chair)?: Total Help needed to walk in hospital room?: Total Help needed climbing 3-5 steps with a railing? : Total 6 Click Score: 8    End of Session   Activity Tolerance: Other (comment)(limited by nausea, emesis) Patient left: in bed;with call bell/phone within reach;with bed alarm set Nurse Communication: Mobility status PT Visit Diagnosis: Other abnormalities of gait and mobility (R26.89)    Time: 2836-6294 PT Time Calculation (min) (ACUTE ONLY): 24 min   Charges:   PT Evaluation $PT Eval Low Complexity: [place], PT, DPT Acute Rehabilitation Services Office: [phone] Pager: [phone]  Trena Platt 07/29/2018, 11:53 AM

## 2018-07-29 NOTE — Progress Notes (Signed)
Pt developing wet cough after a few episodes of vomiting. Paged MD and notified him and discussed concern for aspiration. Chest x-ray was ordered. Pt remains in an upright position and NPO. I also notified the MD that the pt was refusing medications and that the family wanted an update from the MD. Will continue to monitor and update MD/family as needed.

## 2018-07-29 NOTE — Progress Notes (Signed)
[place] Progress Note Patient Name: Shelby Walker DOB: 10/22/1933 MRN: [id number]   Date of Service  07/29/2018  HPI/Events of Note  Ca++ = 6.1 and Phosphate = 5.8 and 2. Troponin = 0.03 - Already on ASA. No acute Rx indicated for elevated phosphate. Rounding team will need to consider Nephrology consult (Creatinine = 3.99).  eICU Interventions  Will order: 1. Replace Ca++. 2. Continue to cycle Troponin.      Intervention Category Major Interventions: Electrolyte abnormality - evaluation and management  Sommer,Steven Eugene 07/29/2018, 6:52 AM

## 2018-07-29 NOTE — Progress Notes (Signed)
Spoke with pt's son and daughter and updated about treatment plan.  Chesley Mires, MD [facility] Pulmonary/Critical Care 07/29/2018, 8:57 AM

## 2018-07-29 NOTE — Progress Notes (Addendum)
eLink Physician-Brief Progress Note Patient Name: Shelby Walker DOB: 1933/11/10 MRN: [id number]   Date of Service  07/29/2018  HPI/Events of Note  SOB and wheesing persist. CXR reveals L base atelectasis vs pleural effusion vs infiltrate. CVP = 8.   eICU Interventions  Will order: 1. Incentive spirometry Q 1 hour while awake.  2. Monitor CVP now and Q 4 hours.      Intervention Category Major Interventions: Other:  Sommer,Steven Cornelia Copa 07/29/2018, 3:00 AM

## 2018-07-29 NOTE — TOC Initial Note (Signed)
Transition of Care [facility]) - Initial/Assessment Note    Patient Details  Name: Shelby Walker MRN: [id number] Date of Birth: 1933-10-20  Transition of Care [facility]) CM/SW Contact:    Joaquin Courts, RN Phone Number: 07/29/2018, 2:35 PM  Clinical Narrative:   CM attempted to speak with patient regarding SNF recommendations. Patient is disoriented.  CM contacted primary contact [name] (pt daughter).  Spoke with [name] regarding SNF recommendations. Daughter reports that given the current Covid outbreak, they would prefer for the pt to return home with home health.  Choice offered to daughter who will look into agency ratings and speak with the rest of the family prior to making decision. CM to follow-up.                  Expected Discharge Plan: Home/Self Care Barriers to Discharge: Continued Medical Work up   Patient Goals and CMS Choice     Choice offered to / list presented to : Adult Children  Expected Discharge Plan and Services Expected Discharge Plan: Home/Self Care   Discharge Planning Services: CM Consult Post Acute Care Choice: Home Health, Nursing Home Living arrangements for the past 2 months: Single Family Home Expected Discharge Date: 07/26/18                                    Prior Living Arrangements/Services Living arrangements for the past 2 months: Single Family Home Lives with:: Spouse          Need for Family Participation in Patient Care: Yes (Comment) Care giver support system in place?: Yes (comment)   Criminal Activity/Legal Involvement Pertinent to Current Situation/Hospitalization: No - Comment as needed  Activities of Daily Living Home Assistive Devices/Equipment: Walker (specify type) ADL Screening (condition at time of admission) Patient's cognitive ability adequate to safely complete daily activities?: Yes Is the patient deaf or have difficulty hearing?: Yes Does the patient have difficulty seeing, even when wearing  glasses/contacts?: No Does the patient have difficulty concentrating, remembering, or making decisions?: No Patient able to express need for assistance with ADLs?: Yes Does the patient have difficulty dressing or bathing?: No Independently performs ADLs?: Yes (appropriate for developmental age) Does the patient have difficulty walking or climbing stairs?: No Weakness of Legs: Both Weakness of Arms/Hands: None  Permission Sought/Granted                  Emotional Assessment Appearance:: Appears stated age Attitude/Demeanor/Rapport: Other (comment)(confused) Affect (typically observed): Accepting Orientation: : Fluctuating Orientation (Suspected and/or reported Sund[place])   Psych Involvement: No (comment)  Admission diagnosis:  Dehydration [E86.0] Diverticulitis [K57.92] General weakness [R53.1] Patient Active Problem List   Diagnosis Date Noted  . Diverticulitis 07/24/2018  . Colitis presumed infectious 07/24/2018  . Colitis 07/24/2018  . Clostridium difficile colitis 07/24/2018  . Mixed hyperlipidemia 09/29/2016  . Stable angina ([place]) 09/29/2016  . Claudication in peripheral vascular disease ([place]) 08/09/2016  . Preoperative clearance 05/21/2012  . Precordial chest pain   . Palpitations   . Diabetes mellitus ([place])   . GERD (gastroesophageal reflux disease)   . Ejection fraction   . Carotid artery disease ([place])   . Mitral regurgitation 04/21/2009  . PSORIASIS 02/09/2009  . Rheumatoid arthritis ([place]) 02/09/2009   PCP:  Reynold Bowen, MD Pharmacy:   [id number] [address], [place] [address] Phone: [phone] Fax: [phone]  [place], [address] [place] [address] Phone: [phone] Fax: [phone]     Social Determinants of Health (SDOH) Interventions    Readmission Risk Interventions No  flowsheet data found.

## 2018-07-30 ENCOUNTER — Ambulatory Visit: Payer: Medicare Other | Admitting: Gastroenterology

## 2018-07-30 ENCOUNTER — Encounter (HOSPITAL_COMMUNITY): Payer: Self-pay | Admitting: Nephrology

## 2018-07-30 ENCOUNTER — Inpatient Hospital Stay (HOSPITAL_COMMUNITY): Payer: Medicare Other

## 2018-07-30 DIAGNOSIS — R579 Shock, unspecified: Secondary | ICD-10-CM | POA: Diagnosis present

## 2018-07-30 LAB — GLUCOSE, CAPILLARY
Glucose-Capillary: 198 mg/dL — ABNORMAL HIGH (ref 70–99)
Glucose-Capillary: 157 mg/dL — ABNORMAL HIGH (ref 70–99)
Glucose-Capillary: 162 mg/dL — ABNORMAL HIGH (ref 70–99)
Glucose-Capillary: 98 mg/dL (ref 70–99)
Glucose-Capillary: 118 mg/dL — ABNORMAL HIGH (ref 70–99)

## 2018-07-30 LAB — BASIC METABOLIC PANEL
Anion gap: 12 (ref 5–15)
BUN: 65 mg/dL — ABNORMAL HIGH (ref 8–23)
CO2: 29 mmol/L (ref 22–32)
Calcium: 6.5 mg/dL — ABNORMAL LOW (ref 8.9–10.3)
Chloride: 95 mmol/L — ABNORMAL LOW (ref 98–111)
Creatinine, Ser: 3.98 mg/dL — ABNORMAL HIGH (ref 0.44–1.00)
GFR calc Af Amer: 11 mL/min — ABNORMAL LOW (ref >60)
GFR calc non Af Amer: 10 mL/min — ABNORMAL LOW (ref >60)
Glucose, Bld: 222 mg/dL — ABNORMAL HIGH (ref 70–99)
Potassium: 3.4 mmol/L — ABNORMAL LOW (ref 3.5–5.1)
Sodium: 136 mmol/L (ref 135–145)

## 2018-07-30 MED ORDER — FUROSEMIDE 10 MG/ML IJ SOLN
100.0000 mg | Freq: Three times a day (TID) | INTRAVENOUS | Status: DC
  Administered 2018-07-30 – 2018-07-31 (×2): 100 mg via INTRAVENOUS
  Filled 2018-07-30 (×4): qty 10

## 2018-07-30 MED ORDER — LACTATED RINGERS IV SOLN
INTRAVENOUS | Status: DC
  Administered 2018-07-30: 13:00:00 via INTRAVENOUS

## 2018-07-30 MED ORDER — POTASSIUM CHLORIDE CRYS ER 20 MEQ PO TBCR: 20.0000 meq | EXTENDED_RELEASE_TABLET | Freq: Two times a day (BID) | ORAL | Status: DC

## 2018-07-30 MED ORDER — HYDROCORTISONE NA SUCCINATE PF 100 MG IJ SOLR
50.0000 mg | Freq: Two times a day (BID) | INTRAMUSCULAR | Status: DC
  Administered 2018-07-30 – 2018-08-01 (×4): 50 mg via INTRAVENOUS
  Filled 2018-07-30 (×4): qty 2

## 2018-07-30 MED ORDER — SODIUM CHLORIDE 0.9 % IV BOLUS: 500.0000 mL | Freq: Once | INTRAVENOUS | Status: DC

## 2018-07-30 MED ORDER — POTASSIUM CHLORIDE CRYS ER 20 MEQ PO TBCR
20.0000 meq | EXTENDED_RELEASE_TABLET | Freq: Two times a day (BID) | ORAL | Status: AC
  Administered 2018-07-30 – 2018-08-02 (×6): 20 meq via ORAL
  Filled 2018-07-30 (×6): qty 1

## 2018-07-30 MED ORDER — POTASSIUM CHLORIDE CRYS ER 20 MEQ PO TBCR
20.0000 meq | EXTENDED_RELEASE_TABLET | Freq: Two times a day (BID) | ORAL | Status: DC
  Administered 2018-07-30: 20 meq via ORAL
  Filled 2018-07-30: qty 1

## 2018-07-30 NOTE — Consult Note (Signed)
Renal Service Consult Note [facility]  Shelby Walker 07/30/2018 Shelby Walker Requesting Physician:  Dr Sloan Leiter, K.   Reason for Consult:  Acute on CKD  HPI: The patient is a 85 y.o. year-old with hx of RA, psoriasis, DM on insulin presented with N/V and diarrhea. CT showed diffuse colitis, pt admitted.  Cdif returned positive for Ag and toxin. Creat was 1.22 on admit 7 days ago and is now up to 3.98 with very low UOP.  Asked tos ee for renal failure.   Patient denies any n/v, diarrhea has mostly resolved on po vancomycin.  No confusion or jerking, no abd pain.  Some SOB and cough.      UA 5/21 - hazy, rare bact, 0-5 rbc/ 6-10 wbc/ 6-10 epi, neg protein  ECHO LVEF 60-65%   Date  Creat  eGFR  07/09/18  1.49  37  07/24/18 admit 1.22  5/23  2.92  07/28/18 3.69  5/26  3.99  07/30/18 3.98   ROS  denies CP  no joint pain   no HA  no blurry vision  no rash  no diarrhea  no nausea/ vomiting   Past Medical History  Past Medical History:  Diagnosis Date  . Arthritis, rheumatoid ([place])   . Bruit    Carotid Doppler showed no significant abnormality     9per patient)  . Chest pain, unspecified    Nuclear, May, 2008, no scar or ischemia  . Diabetes mellitus   . Dyslipidemia   . Ejection fraction    EF 55-60%, echo, February, 2011  . GERD (gastroesophageal reflux disease)   . Mitral regurgitation April 21, 2009   mild,  echo, February, 2011  . Osteoporosis   . Palpitations    possible very brief atrial fibrillation on monitor and possible reentrant tachycardia  . Psoriasis   . Rheumatoid arthritis [facility])    Past Surgical History  Past Surgical History:  Procedure Laterality Date  . FRACTURE SURGERY     Family History  Family History  Problem Relation Age of Onset  . Heart failure Father   . Heart attack Father   . Diabetes Father   . Heart attack Mother   . Heart failure Mother   . Diabetes Mother   . Stroke Sister    Social History  reports that  she has never smoked. She has never used smokeless tobacco. She reports that she does not drink alcohol or use drugs. Allergies  Allergies  Allergen Reactions  . Compazine [Prochlorperazine Edisylate] Other (See Comments)    REACTION: " tongue swell and unable to swallow"  . Sulfonamide Derivatives Other (See Comments)    REACTION: " broke out with fine itching bumps"   Home medications Prior to Admission medications   Medication Sig Start Date End Date Taking? Authorizing Provider  aspirin 325 MG EC tablet Take 325 mg by mouth at bedtime.   Yes [provider]  atorvastatin (LIPITOR) 20 MG tablet TAKE 1 TABLET BY MOUTH EVERY DAY Patient taking differently: Take 20 mg by mouth at bedtime.  01/27/18  Yes End, Harrell Gave, MD  bisacodyl (DULCOLAX) 5 MG EC tablet Take 5 mg by mouth daily as needed for moderate constipation.   Yes [provider]  Calcium Carbonate-Vitamin D (CALTRATE 600+D) 600-400 MG-UNIT per tablet Take 1 tablet by mouth daily.     Yes [provider]  Cholecalciferol 1000 units capsule Take 1,000 Units by mouth daily.    Yes [provider]  diltiazem (CARDIZEM) 60 MG tablet Take 60 mg by mouth at bedtime.   Yes [provider]  esomeprazole (NEXIUM) 40 MG capsule Take 40 mg by mouth daily at 12 noon.   Yes [provider]  etanercept (ENBREL) 50 MG/ML injection Inject 50 mg into the skin once a week.   Yes [provider]  insulin NPH Human (NOVOLIN N) 100 UNIT/ML injection Inject 20 Units into the skin at bedtime.   Yes [provider]  metoprolol succinate (TOPROL-XL) 25 MG 24 hr tablet Take 25 mg by mouth daily.     Yes [provider]  Multiple Vitamins-Minerals (CENTRUM SILVER ULTRA WOMENS) TABS Take 1 tablet by mouth daily.    Yes [provider]  nitroGLYCERIN (NITROSTAT) 0.4 MG SL tablet 1 TABLET UNDER TONGUE AT ONSET OF CHEST PAIN YOU MAY REPEAT EVERY 5 MINUTES FOR UP TO 3  DOSES. Patient taking differently: Place 0.4 mg under the tongue See admin instructions. 1 TABLET UNDER TONGUE AT ONSET OF CHEST PAIN YOU MAY REPEAT EVERY 5 MINUTES FOR UP TO 3 DOSES. 04/22/18  Yes Nahser, Wonda Cheng, MD  Omega-3 Fatty Acids (FISH OIL) 1200 MG CAPS Take 1 capsule by mouth daily at 12 noon.   Yes [provider]  predniSONE (DELTASONE) 5 MG tablet Take 5 mg by mouth daily. 08/31/16  Yes [provider]   Liver Function Tests Recent Labs  Lab 07/27/18 0330 07/27/18 1040 07/27/18 2000  AST _0 ALT _1 ALKPHOS 49 55 60  BILITOT 0.2* 0.3 0.5  PROT 4.0* 4.1* 4.1*  ALBUMIN 1.5* 1.6* 1.6*   Recent Labs  Lab 07/24/18 1120  LIPASE 23   CBC Recent Labs  Lab 07/24/18 1120  07/26/18 0249 07/27/18 0330 07/28/18 0440 07/29/18 0533  WBC 15.3*   < > 23.2* 16.5* 19.7* 22.2*  NEUTROABS 12.6*  --  18.3*  --   --   --   HGB 11.4*   < > 12.8 11.1* 10.7* 10.7*  HCT 36.4   < > 43.4 33.3* 32.0* 33.4*  MCV 92.9   < > 99.5 88.1 86.7 88.8  PLT 247   < > 317 246 216 173   < > = values in this interval not displayed.   Basic Metabolic Panel Recent Labs  Lab 07/26/18 0249 07/26/18 1500 07/27/18 0330 07/27/18 1040 07/27/18 2000 07/28/18 0718 07/29/18 0533 07/30/18 0526  NA 138 137 138 138 139 137 135 136  K 4.6 3.8 3.5 3.4* 3.3* 3.2* 3.7 3.4*  CL 111 112* 105 101 98 92* 92* 95*  CO2 9* 12* 18* 17* _2 GLUCOSE 191* 204* 182* 173* 101* 108* 321* 222*  BUN 40* 42* 43* 47* 48* 48* 53* 65*  CREATININE 2.84* 2.92* 3.05* 3.35* 3.45* 3.69* 3.99* 3.98*  CALCIUM 6.2* 5.2* 5.6* 5.5* 5.8* 5.9* 6.1* 6.5*  PHOS 6.1*  --   --   --   --   --  5.8*  --    Iron/TIBC/Ferritin/ %Sat No results found for: IRON, TIBC, FERRITIN, IRONPCTSAT  Vitals:   07/30/18 1100 07/30/18 1200 07/30/18 1300 07/30/18 1400  BP: (!) 94/45 (!) 82/49 (!) 137/58 (!) 168/64  Pulse: 73 87 72 71  Resp: _3 Temp:      TempSrc:      SpO2: 99% 100% 100% 100%  Weight:       Height:       Exam Gen  alert, lying flat, elderly calm No rash, cyanosis or gangrene Sclera anicteric, throat clear  No jvd or bruits Chest some rales R base, L clear RRR no MRG Abd soft ntnd no mass or ascites +bs GU defer MS no joint effusions or deformity Ext diffuse 2+ pitting LE edema, feet to hips Neuro is alert, Ox 3 , nf, gen'd weak    Home meds:  - aspirin 325/ atorvastatin 20  - prednisone 5/ etanercept 85m weekly  - esomeprazole 40 qd  - insulin NPH 20 hs  - metoprolol xl 25 qd/ diltiazem 60 hs  - prn's/ vitamins/ supplements   CXR's - no acute disease  Renal [place]- no sig abnormalities, no hydro. 10-11 cm kidneys.   Assessment: 1. AKI on CKD 3 - pt sig vol overloaded, recommend dc IVF's and start IV lasix at relatively high dose given the high creatinine. Place foley, get urine lytes and renal [place] Not uremic, no acute indication for dialysis.  Avoid all nephrotoxic medications. UA negative.  2. Vol overload - needs diuresis, see above.  3. Cdif infection/ colitis/ diarrhea - improving on po vanc 4. RA/ psoriasis 5. DM2 on insulin    Plan: 1. Will follow.       RKelly Splinter MD 07/30/2018, 4:11 PM

## 2018-07-30 NOTE — Progress Notes (Signed)
Elink notified regarding no urine output since foley was removed on dayshift. Bladder scan 3ml. Patient with AKI. Awaiting orders. Will continue to closely monitor patient.

## 2018-07-30 NOTE — Progress Notes (Signed)
Bladder scan repeated due to no void this shift. 31ml present on bladder scan. elink notified. Awaiting new orders. Will continue to closely monitor.

## 2018-07-30 NOTE — Progress Notes (Signed)
PROGRESS NOTE    Shelby Walker  GMW:[id number] DOB: 1933/09/22 DOA: 07/24/2018 PCP: Reynold Bowen, MD    Brief Narrative:   Patient is 85 year old female with history of type 2 diabetes on oral hypoglycemics, hypertension, hyperlipidemia, GERD and rheumatoid arthritis on chronic prednisone therapy who presented to the hospital on 07/24/2018 with nausea, vomiting, diarrhea ongoing for about 3 weeks.  Patient was seen at urgent care and diagnosed with infectious colitis and had completed 7 days course of ciprofloxacin and Flagyl without improvement of symptoms.  On arrival to the emergency room, patient was febrile, tachycardic.  COVID-19 test was negative.  CT scan abdomen pelvis showed diffuse colitis without perforation or abscess.  Patient was admitted to the general medical floor and started on treatment.  Patient started becoming hypotensive and tachycardic on the evening of 07/25/2018 and she was transferred to stepdown.  She developed acute kidney injury, leukocytosis worsened and blood pressure further dropped and was transferred to ICU on 07/26/2018.  Since then she has been diagnosed with C. difficile colitis, acute kidney injury.  5/21 Admitted 5/23 Transferred to ICU for hypotension requiring vasopressors; DNR/DNI 5/24 off pressors; a fib with RVR, started amiodarone 5/26 sinus rhythm Transferred out of ICU.  Patient remains anuric. 5/27 patient has 100-150 mL urine output for last few days.  7 L positive balance.  Assessment & Plan:   Principal Problem:   Clostridium difficile colitis Active Problems:   Rheumatoid arthritis ([place])   Diabetes mellitus ([place])   GERD (gastroesophageal reflux disease)   Diverticulitis   Colitis presumed infectious   Colitis   Shock ([place])  Shock: Septic shock versus hypovolemic shock from C. difficile colitis and sepsis present on admission. Currently adequately improved.  Hemodynamically improving.  Off all antibiotics and remains on oral vancomycin  for 14 days. On a stress dose of IV steroids, will decrease the dose today. Blood cultures were negative. Continue monitoring on a stepdown unit today.  C. difficile colitis: With persistent nausea.  Patient had episode of vomiting yesterday.  Currently denies any nausea vomiting.  Will challenge with clear liquid diet and advance as tolerated.  Acute kidney injury with oliguric renal failure on chronic kidney disease stage III: Patient has significant positive balance and oliguria.  Will check urinalysis.  Will send for renal ultrasound. With abnormal electrolytes, persistently abnormal renal functions, I called and discussed case with nephrology.  They will see patient in consultation.  Continue maintenance IV fluid.  Will stop dextrose and change to lactated Ringer.  Strict intake and output monitoring.  Daily weight.  Hypocalcemia: Her corrected calcium is normal.  Her albumin is 1.5.  Hypokalemia: We will replace with 2 doses of potassium.  Recheck level in the morning.  Type 2 diabetes: Exacerbated by high steroid use and IV dextrose use.  Currently on Lantus and sliding scale insulin.  Will decrease steroid as well as discontinue dextrose fluid.  Hypertension: Home blood pressure medications on hold.  Metoprolol as needed.  GERD: On PPI continue.  Rheumatoid arthritis: On Enbrel and prednisone at home.  Enbrel on hold.  Remains on high-dose IV steroids.  Physical debility and deconditioning: Continue to work with PT OT now.  New onset A. fib with RVR in the setting of sepsis: Currently sinus rhythm.  Off amiodarone.  Will not favor anticoagulation.   DVT prophylaxis: Heparin subcu Code Status: DNR/DNI Family Communication: updated daughter [name] over the phone. Disposition Plan:  Stepdown unit.   Consultants:   PCCM, signed  off,  Nephrology consulted  Procedures:   None  Antimicrobials:  Flagyl 5/21 >> 5/23 Zosyn 5/23 >> 5/25 Oral vancomycin 5/22 >> 5/24  Vancomycin enema 5/24 >> 5/26 Oral vancomycin 5/26 >>    Subjective: Patient seen and examined.  Overnight events noted.  She had some nausea so was suggested n.p.o.  No other overnight events.  Remains sinus rhythm.  She denies any nausea vomiting.  She has some soreness. She had 2 loose bowel movement overnight.  Objective: Vitals:   07/30/18 0600 07/30/18 0700 07/30/18 0800 07/30/18 0808  BP: (!) 143/55 (!) 164/65    Pulse: 71 79  80  Resp: 14 17  16   Temp:   98 F (36.7 C)   TempSrc:   Oral   SpO2: 99% 99%  98%  Weight:      Height:        Intake/Output Summary (Last 24 hours) at 07/30/2018 1218 Last data filed at 07/30/2018 0917 Gross per 24 hour  Intake 780.74 ml  Output 150 ml  Net 630.74 ml   Filed Weights   07/24/18 1958  Weight: 57.2 kg    Examination:  General exam: Appears calm and comfortable , on room air.  Chronically sick looking but currently stable. RA deformities in both hands and wrists.  Respiratory system: Clear to auscultation. Respiratory effort normal. Cardiovascular system: S1 & S2 heard, RRR. No JVD, murmurs, rubs, gallops or clicks. No pedal edema.  Tachycardic. Gastrointestinal system: Abdomen is nondistended, soft and nontender. No organomegaly or masses felt. Normal bowel sounds heard.  No rigidity or guarding. Central nervous system: Alert and oriented. No focal neurological deficits. Extremities: Symmetric 5 x 5 power. Skin: No rashes, lesions or ulcers Psychiatry: Judgement and insight appear normal. Mood & affect appropriate.     Data Reviewed: I have personally reviewed following labs and imaging studies  CBC: Recent Labs  Lab 07/24/18 1120 07/25/18 0614 07/26/18 0249 07/27/18 0330 07/28/18 0440 07/29/18 0533  WBC 15.3* 13.9* 23.2* 16.5* 19.7* 22.2*  NEUTROABS 12.6*  --  18.3*  --   --   --   HGB 11.4* 11.6* 12.8 11.1* 10.7* 10.7*  HCT 36.4 36.8 43.4 33.3* 32.0* 33.4*  MCV 92.9 93.2 99.5 88.1 86.7 88.8  PLT 247 240 317  246 216 646   Basic Metabolic Panel: Recent Labs  Lab 07/24/18 2339  07/26/18 0249  07/27/18 1040 07/27/18 2000 07/28/18 0718 07/29/18 0533 07/30/18 0526  NA  --    < > 138   < > 138 139 137 135 136  K  --    < > 4.6   < > 3.4* 3.3* 3.2* 3.7 3.4*  CL  --    < > 111   < > 101 98 92* 92* 95*  CO2  --    < > 9*   < > 17* 25 28 26 29   GLUCOSE  --    < > 191*   < > 173* 101* 108* 321* 222*  BUN  --    < > 40*   < > 47* 48* 48* 53* 65*  CREATININE  --    < > 2.84*   < > 3.35* 3.45* 3.69* 3.99* 3.98*  CALCIUM  --    < > 6.2*   < > 5.5* 5.8* 5.9* 6.1* 6.5*  MG 1.8  --  2.1  --   --   --   --  1.9  --   PHOS  --   --  6.1*  --   --   --   --  5.8*  --    < > = values in this interval not displayed.   GFR: Estimated Creatinine Clearance: 8.3 mL/min (A) (by C-G formula based on SCr of 3.98 mg/dL (H)). Liver Function Tests: Recent Labs  Lab 07/25/18 0614 07/26/18 1500 07/27/18 0330 07/27/18 1040 07/27/18 2000  AST 13* 15 19 22 25   ALT 11 11 11 12 13   ALKPHOS 35* 72 49 55 60  BILITOT 1.2 <0.1* 0.2* 0.3 0.5  PROT 4.9* 3.8* 4.0* 4.1* 4.1*  ALBUMIN 2.2* 1.5* 1.5* 1.6* 1.6*   Recent Labs  Lab 07/24/18 1120  LIPASE 23   No results for input(s): AMMONIA in the last 168 hours. Coagulation Profile: No results for input(s): INR, PROTIME in the last 168 hours. Cardiac Enzymes: Recent Labs  Lab 07/24/18 1120 07/25/18 1719 07/29/18 0533 07/29/18 1020 07/29/18 1725  TROPONINI <0.03 <0.03 0.03* <0.03 0.03*   BNP (last 3 results) No results for input(s): PROBNP in the last 8760 hours. HbA1C: No results for input(s): HGBA1C in the last 72 hours. CBG: Recent Labs  Lab 07/29/18 1154 07/29/18 1655 07/29/18 2130 07/30/18 0745 07/30/18 1203  GLUCAP 266* 172* 226* 198* 157*   Lipid Profile: No results for input(s): CHOL, HDL, LDLCALC, TRIG, CHOLHDL, LDLDIRECT in the last 72 hours. Thyroid Function Tests: No results for input(s): TSH, T4TOTAL, FREET4, T3FREE, THYROIDAB in the  last 72 hours. Anemia Panel: No results for input(s): VITAMINB12, FOLATE, FERRITIN, TIBC, IRON, RETICCTPCT in the last 72 hours. Sepsis Labs: Recent Labs  Lab 07/24/18 2111 07/24/18 2339 07/26/18 0857 07/26/18 1128  LATICACIDVEN 1.4 1.0 2.7* 1.7    Recent Results (from the past 240 hour(s))  Gastrointestinal Panel by PCR , Stool     Status: None   Collection Time: 07/24/18  4:40 PM  Result Value Ref Range Status   Campylobacter species NOT DETECTED NOT DETECTED Final   Plesimonas shigelloides NOT DETECTED NOT DETECTED Final   Salmonella species NOT DETECTED NOT DETECTED Final   Yersinia enterocolitica NOT DETECTED NOT DETECTED Final   Vibrio species NOT DETECTED NOT DETECTED Final   Vibrio cholerae NOT DETECTED NOT DETECTED Final   Enteroaggregative E coli (EAEC) NOT DETECTED NOT DETECTED Final   Enteropathogenic E coli (EPEC) NOT DETECTED NOT DETECTED Final   Enterotoxigenic E coli (ETEC) NOT DETECTED NOT DETECTED Final   Shiga like toxin producing E coli (STEC) NOT DETECTED NOT DETECTED Final   Shigella/Enteroinvasive E coli (EIEC) NOT DETECTED NOT DETECTED Final   Cryptosporidium NOT DETECTED NOT DETECTED Final   Cyclospora cayetanensis NOT DETECTED NOT DETECTED Final   Entamoeba histolytica NOT DETECTED NOT DETECTED Final   Giardia lamblia NOT DETECTED NOT DETECTED Final   Adenovirus F40/41 NOT DETECTED NOT DETECTED Final   Astrovirus NOT DETECTED NOT DETECTED Final   Norovirus GI/GII NOT DETECTED NOT DETECTED Final   Rotavirus A NOT DETECTED NOT DETECTED Final   Sapovirus (I, II, IV, and V) NOT DETECTED NOT DETECTED Final    Comment: Performed at [facility], [address] [place], [place] [address]  SARS Coronavirus 2 (Hosp order,Performed in [place] lab via [employer] ID)     Status: None   Collection Time: 07/24/18  4:40 PM  Result Value Ref Range Status   SARS Coronavirus 2 ([employer] ID Now) NEGATIVE NEGATIVE Final    Comment: (NOTE) Interpretive Result  Comment(s): COVID 19 Positive SARS CoV 2 target nucleic acids are DETECTED.  The SARS CoV 2 RNA is generally detectable in upper and lower respiratory specimens during the acute phase of infection.  Positive results are indicative of active infection with SARS CoV 2.  Clinical correlation with patient history and other diagnostic information is necessary to determine patient infection status.  Positive results do not rule out bacterial infection or coinfection with other viruses. The expected result is Negative. COVID 19 Negative SARS CoV 2 target nucleic acids are NOT DETECTED. The SARS CoV 2 RNA is generally detectable in upper and lower respiratory specimens during the acute phase of infection.  Negative results do not preclude SARS CoV 2 infection, do not rule out coinfections with other pathogens, and should not be used as the sole basis for treatment or other patient management decisions.  Negative results must be combined with clinical  observations, patient history, and epidemiological information. The expected result is Negative. Invalid Presence or absence of SARS CoV 2 nucleic acids cannot be determined. Repeat testing was performed on the submitted specimen and repeated Invalid results were obtained.  If clinically indicated, additional testing on a new specimen with an alternate test methodology [phone]) is advised.  The SARS CoV 2 RNA is generally detectable in upper and lower respiratory specimens during the acute phase of infection. The expected result is Negative. Fact Sheet for Patients:  GolfingFamily.no Fact Sheet for Healthcare Providers: https://www.hernandez-brewer.com/ This test is not yet approved or cleared by the [place] FDA and has been authorized for detection and/or diagnosis of SARS CoV 2 by FDA under an Emergency Use Authorization (EUA).  This EUA will remain in effect (meaning this test can be used) for the  duration of the COVID19 d eclaration under Section 564(b)(1) of the Act, 21 U.S.C. section [phone] 3(b)(1), unless the authorization is terminated or revoked sooner. Performed at [facility], [address] [place], [place] [address]   C difficile quick scan w PCR reflex     Status: Abnormal   Collection Time: 07/24/18  6:50 PM  Result Value Ref Range Status   C Diff antigen POSITIVE (A) NEGATIVE Final   C Diff toxin POSITIVE (A) NEGATIVE Final   C Diff interpretation Toxin producing C. difficile detected.  Final    Comment: CRITICAL RESULT CALLED TO, READ BACK BY AND VERIFIED WITH: Harlen Labs RN 07/24/18 2224 JDW Performed at [facility], [place] [address] [place], [place] [address]   MRSA PCR Screening     Status: None   Collection Time: 07/25/18  6:07 PM  Result Value Ref Range Status   MRSA by PCR NEGATIVE NEGATIVE Final    Comment:        The GeneXpert MRSA Assay (FDA approved for NASAL specimens only), is one component of a comprehensive MRSA colonization surveillance program. It is not intended to diagnose MRSA infection nor to guide or monitor treatment for MRSA infections. Performed at [facility], [place] [address] [place], [place] [address]   Culture, blood (routine x 2)     Status: None (Preliminary result)   Collection Time: 07/26/18  8:57 AM  Result Value Ref Range Status   Specimen Description   Final    RIGHT ANTECUBITAL Performed at [facility] [address], [place], [place] [address]    Special Requests   Final    BOTTLES DRAWN AEROBIC ONLY Blood Culture adequate volume Performed at [facility] [address], [place], [place] [address]  Culture   Final    NO GROWTH 4 DAYS Performed at [facility], [place] [address], [place], [place] [address]    Report Status PENDING  Incomplete  Culture, blood (routine x 2)     Status: None (Preliminary result)   Collection Time:  07/26/18  8:57 AM  Result Value Ref Range Status   Specimen Description   Final    BLOOD LEFT HAND Performed at [facility] [address], [place], [place] [address]    Special Requests   Final    BOTTLES DRAWN AEROBIC ONLY Blood Culture results may not be optimal due to an inadequate volume of blood received in culture bottles Performed at [facility] [address] [place], [place] [address]    Culture   Final    NO GROWTH 4 DAYS Performed at [facility], [place] [address] [place], [place] [address]    Report Status PENDING  Incomplete         Radiology Studies: Dg Chest Port 1 View  Result Date: 07/29/2018 CLINICAL DATA:  Cough and vomiting EXAM: PORTABLE CHEST 1 VIEW COMPARISON:  07/29/2018 FINDINGS: Cardiac shadow is stable. Left jugular central line is again noted at the cavoatrial junction. The lungs are well aerated bilaterally. Small pleural effusions are seen bilaterally with left basilar atelectasis. No other focal abnormality is noted. IMPRESSION: Small effusions with left basilar atelectasis. No new focal abnormality is noted. Electronically Signed   By: Inez Catalina M.D.   On: 07/29/2018 19:32   Dg Chest Port 1 View  Result Date: 07/29/2018 CLINICAL DATA:  Cough.  Respiratory failure. EXAM: PORTABLE CHEST 1 VIEW COMPARISON:  Chest x-ray dated 07/26/2018 FINDINGS: The heart size is enlarged. There is a growing left-sided pleural effusion. The left IJ catheter is well position. No pneumothorax. There is an increasing retrocardiac opacity. There is no acute osseous abnormality. IMPRESSION: 1. Increasing left basilar opacity which may represent a combination of a small left-sided pleural effusion and/or atelectasis/infiltrate. 2. Mild cardiac enlargement. 3. Stable positioning of the left IJ line. Electronically Signed   By: Constance Holster M.D.   On: 07/29/2018 02:43        Scheduled Meds: . Chlorhexidine Gluconate  Cloth  6 each Topical Daily  . Gerhardt's butt cream   Topical BID  . heparin injection (subcutaneous)  5,000 Units Subcutaneous Q8H  . hydrocortisone sod succinate (SOLU-CORTEF) inj  50 mg Intravenous Q12H  . insulin aspart  0-9 Units Subcutaneous TID WC  . insulin glargine  10 Units Subcutaneous QHS  . mouth rinse  15 mL Mouth Rinse BID  . metoprolol tartrate  2.5 mg Intravenous Q8H  . nystatin   Topical BID  . potassium chloride  20 mEq Oral BID  . sodium chloride flush  10-40 mL Intracatheter Q12H  . vancomycin  125 mg Oral QID   Continuous Infusions: . lactated ringers       LOS: 6 days    Time spent: 25 minutes    Barb Merino, MD [facility] Pager [phone]  If 7PM-7AM, please contact night-coverage [email or web address] Password [email or web address] 07/30/2018, 12:18 PM

## 2018-07-30 NOTE — Progress Notes (Signed)
OT Cancellation Note  Patient Details Name: ANJELIQUE MAKAR MRN: [id number] DOB: 1933/12/31   Cancelled Treatment:    Reason Eval/Treat Not Completed: Patient not medically ready  Spoke with RN.  Pts BP low this day. Will check on pt later in day or next day  Kari Baars, [place] [phone] Office- [phone], [place] Parkin 07/30/2018, 12:52 PM

## 2018-07-31 LAB — GLUCOSE, CAPILLARY
Glucose-Capillary: 85 mg/dL (ref 70–99)
Glucose-Capillary: 86 mg/dL (ref 70–99)
Glucose-Capillary: 95 mg/dL (ref 70–99)
Glucose-Capillary: 135 mg/dL — ABNORMAL HIGH (ref 70–99)

## 2018-07-31 LAB — CBC WITH DIFFERENTIAL/PLATELET
Abs Immature Granulocytes: 0.68 10*3/uL — ABNORMAL HIGH (ref 0.00–0.07)
Basophils Absolute: 0.1 10*3/uL (ref 0.0–0.1)
Basophils Relative: 1 %
Eosinophils Absolute: 0 10*3/uL (ref 0.0–0.5)
Eosinophils Relative: 0 %
HCT: 33.1 % — ABNORMAL LOW (ref 36.0–46.0)
Hemoglobin: 10.7 g/dL — ABNORMAL LOW (ref 12.0–15.0)
Immature Granulocytes: 6 %
Lymphocytes Relative: 8 %
Lymphs Abs: 1 10*3/uL (ref 0.7–4.0)
MCH: 29.2 pg (ref 26.0–34.0)
MCHC: 32.3 g/dL (ref 30.0–36.0)
MCV: 90.4 fL (ref 80.0–100.0)
Monocytes Absolute: 0.7 10*3/uL (ref 0.1–1.0)
Monocytes Relative: 6 %
Neutro Abs: 9.3 10*3/uL — ABNORMAL HIGH (ref 1.7–7.7)
Neutrophils Relative %: 79 %
Platelets: 140 10*3/uL — ABNORMAL LOW (ref 150–400)
RBC: 3.66 MIL/uL — ABNORMAL LOW (ref 3.87–5.11)
RDW: 14.7 % (ref 11.5–15.5)
WBC: 11.8 10*3/uL — ABNORMAL HIGH (ref 4.0–10.5)
nRBC: 0.3 % — ABNORMAL HIGH (ref 0.0–0.2)

## 2018-07-31 LAB — BASIC METABOLIC PANEL
Anion gap: 14 (ref 5–15)
BUN: 57 mg/dL — ABNORMAL HIGH (ref 8–23)
CO2: 26 mmol/L (ref 22–32)
Calcium: 6.4 mg/dL — CL (ref 8.9–10.3)
Chloride: 99 mmol/L (ref 98–111)
Creatinine, Ser: 3.25 mg/dL — ABNORMAL HIGH (ref 0.44–1.00)
GFR calc Af Amer: 15 mL/min — ABNORMAL LOW (ref >60)
GFR calc non Af Amer: 13 mL/min — ABNORMAL LOW (ref >60)
Glucose, Bld: 100 mg/dL — ABNORMAL HIGH (ref 70–99)
Potassium: 3.1 mmol/L — ABNORMAL LOW (ref 3.5–5.1)
Sodium: 139 mmol/L (ref 135–145)

## 2018-07-31 LAB — CULTURE, BLOOD (ROUTINE X 2)
Culture: NO GROWTH
Culture: NO GROWTH
Special Requests: ADEQUATE

## 2018-07-31 LAB — URINALYSIS, COMPLETE (UACMP) WITH MICROSCOPIC
Bilirubin Urine: NEGATIVE
Glucose, UA: NEGATIVE mg/dL
Ketones, ur: NEGATIVE mg/dL
Leukocytes,Ua: NEGATIVE
Nitrite: NEGATIVE
Protein, ur: NEGATIVE mg/dL
Specific Gravity, Urine: 1.011 (ref 1.005–1.030)
pH: 5 (ref 5.0–8.0)

## 2018-07-31 LAB — SODIUM, URINE, RANDOM: Sodium, Ur: 29 mmol/L

## 2018-07-31 LAB — MAGNESIUM: Magnesium: 1.9 mg/dL (ref 1.7–2.4)

## 2018-07-31 LAB — CREATININE, URINE, RANDOM: Creatinine, Urine: 65.98 mg/dL

## 2018-07-31 MED ORDER — POTASSIUM CHLORIDE CRYS ER 20 MEQ PO TBCR
40.0000 meq | EXTENDED_RELEASE_TABLET | Freq: Once | ORAL | Status: AC
  Administered 2018-07-31: 40 meq via ORAL
  Filled 2018-07-31: qty 2

## 2018-07-31 MED ORDER — FUROSEMIDE 10 MG/ML IJ SOLN
120.0000 mg | Freq: Three times a day (TID) | INTRAVENOUS | Status: DC
  Administered 2018-07-31 – 2018-08-01 (×3): 120 mg via INTRAVENOUS
  Filled 2018-07-31 (×3): qty 12
  Filled 2018-07-31: qty 10

## 2018-07-31 MED ORDER — METOLAZONE 2.5 MG PO TABS
2.5000 mg | ORAL_TABLET | Freq: Every day | ORAL | Status: DC
  Administered 2018-07-31: 2.5 mg via ORAL
  Filled 2018-07-31 (×2): qty 1

## 2018-07-31 MED ORDER — CALCIUM GLUCONATE-NACL 2-0.675 GM/100ML-% IV SOLN
2.0000 g | Freq: Once | INTRAVENOUS | Status: AC
  Administered 2018-07-31: 2000 mg via INTRAVENOUS
  Filled 2018-07-31: qty 100

## 2018-07-31 MED ORDER — POTASSIUM CHLORIDE CRYS ER 20 MEQ PO TBCR: 40.0000 meq | EXTENDED_RELEASE_TABLET | Freq: Four times a day (QID) | ORAL | Status: DC

## 2018-07-31 NOTE — TOC Progression Note (Signed)
Transition of Care [facility]) - Progression Note    Patient Details  Name: Shelby Walker MRN: [id number] Date of Birth: November 09, 1933  Transition of [facility]) CM/SW Contact  Joaquin Courts, RN Phone Number: 07/31/2018, 11:05 AM  Clinical Narrative:   CM spoke with daughter, [name], who selected [facility] for [facility] services. Rep, Dorian Pod, given referral.     Expected Discharge Plan: Home/Self Care Barriers to Discharge: Continued Medical Work up  Expected Discharge Plan and Services Expected Discharge Plan: Home/Self Care   Discharge Planning Services: CM Consult Post Acute Care Choice: [place] arrangements for the past 2 months: Single Family Home Expected Discharge Date: 07/26/18                         HH Arranged: PT, OT HH Agency: [facility] Date [place] Agency Contacted: 07/31/18 Time [place]: 1030 Representative spoke with at [place]: [place]) Interventions    Readmission Risk Interventions No flowsheet data found.

## 2018-07-31 NOTE — Progress Notes (Signed)
OT Cancellation Note  Patient Details Name: Shelby Walker MRN: [id number] DOB: [birth year]   Cancelled Treatment:    Reason Eval/Treat Not Completed: Other (comment)  Pt transferring to the floor. Noted PT recommending SNF at this time, but family may want HH.  OT will eval next day Kari Baars, [place] [phone] Office- [phone], [place]asa Parkin 07/31/2018, 4:15 PM

## 2018-07-31 NOTE — Progress Notes (Signed)
CRITICAL VALUE ALERT  Critical Value:  Ca 6.4  Date & Time Notied:  07/31/2018 0545  Provider Notified: Hilbert Bible, NP  Orders Received/Actions taken: awaiting new orders

## 2018-07-31 NOTE — Progress Notes (Signed)
Physical Therapy Treatment Patient Details Name: Shelby Walker MRN: [id number] DOB: December 09, 1933 Today's Date: 07/31/2018    History of Present Illness 85 year old female who presented with nausea, vomiting and diarrhea x 3 weeks and admitted with sepsis secondary to C. Diff colitis.  PMHx of DM, HTN, GERD, RA    PT Comments    Mod assist for sidelying to sit, pt reported dizziness in sitting (BP 116/51). +2 Max assist for sit to stand with RW x 2  trials. BLEs buckled in standing and pt was very fatigued, so assisted back to supine.   Follow Up Recommendations  SNF;Supervision/Assistance - 24 hour     Equipment Recommendations  None recommended by PT    Recommendations for Other Services       Precautions / Restrictions Precautions Precautions: Fall Precaution Comments: multiple lines Restrictions Weight Bearing Restrictions: No    Mobility  Bed Mobility Overal bed mobility: Needs Assistance Bed Mobility: Sit to Supine;Rolling;Sidelying to Sit Rolling: Min assist Sidelying to sit: Mod assist   Sit to supine: Max assist;+2 for physical assistance   General bed mobility comments: verbal cues for technique, assist to raise trunk for sidelying to sit; max A to control trunk descent and for LEs into bed with sit to supine. Pt reported dizziness in sitting, BP 116/51, HR 90s.   Transfers Overall transfer level: Needs assistance Equipment used: Rolling walker (2 wheeled) Transfers: Sit to/from Stand Sit to Stand: +2 physical assistance;Max assist         General transfer comment: Assist to rise/steady, sit to stand x 2 trials, BLEs buckled in standing with RW and pt was very fatigued so assisted back to supine.  Ambulation/Gait                 Stairs             Wheelchair Mobility    Modified Rankin (Stroke Patients Only)       Balance Overall balance assessment: Needs assistance Sitting-balance support: No upper extremity supported;Feet  unsupported Sitting balance-Leahy Scale: Fair       Standing balance-Leahy Scale: Poor                              Cognition Arousal/Alertness: Awake/alert Behavior During Therapy: WFL for tasks assessed/performed Overall Cognitive Status: Within Functional Limits for tasks assessed                                        Exercises General Exercises - Lower Extremity Long Arc Quad: AROM;15 reps;Both;Seated    General Comments        Pertinent Vitals/Pain Pain Assessment: Faces Faces Pain Scale: Hurts even more Pain Location: stomach Pain Descriptors / Indicators: Tightness Pain Intervention(s): Limited activity within patient's tolerance;Monitored during session    Home Living                      Prior Function            PT Goals (current goals can now be found in the care plan section) Acute Rehab PT Goals PT Goal Formulation: With patient Time For Goal Achievement: 08/05/18 Potential to Achieve Goals: Good Progress towards PT goals: Progressing toward goals    Frequency    Min 3X/week      PT Plan Current plan remains  appropriate    Co-evaluation              AM-PAC PT "6 Clicks" Mobility   Outcome Measure  Help needed turning from your back to your side while in a flat bed without using bedrails?: A Little Help needed moving from lying on your back to sitting on the side of a flat bed without using bedrails?: A Lot Help needed moving to and from a bed to a chair (including a wheelchair)?: Total Help needed standing up from a chair using your arms (e.g., wheelchair or bedside chair)?: Total Help needed to walk in hospital room?: Total Help needed climbing 3-5 steps with a railing? : Total 6 Click Score: 9    End of Session Equipment Utilized During Treatment: Gait belt Activity Tolerance: Patient limited by fatigue Patient left: in bed;with call bell/phone within reach;with bed alarm set Nurse  Communication: Mobility status PT Visit Diagnosis: Other abnormalities of gait and mobility (R26.89)     Time: 8315-1761 PT Time Calculation (min) (ACUTE ONLY): 20 min  Charges:  $Therapeutic Activity: 8-22 mins                     Blondell Reveal Kistler PT 07/31/2018  Acute Rehabilitation Services Pager [phone] Office [phone]

## 2018-07-31 NOTE — Progress Notes (Signed)
PROGRESS NOTE    Shelby Walker  KHT:[id number] DOB: 1934-01-12 DOA: 07/24/2018 PCP: Reynold Bowen, MD    Brief Narrative:   Patient is 85 year old female with history of type 2 diabetes on oral hypoglycemics, hypertension, hyperlipidemia, GERD and rheumatoid arthritis on chronic prednisone therapy who presented to the hospital on 07/24/2018 with nausea, vomiting, diarrhea ongoing for about 3 weeks.  Patient was seen at urgent care and diagnosed with infectious colitis and had completed 7 days course of ciprofloxacin and Flagyl without improvement of symptoms.  On arrival to the emergency room, patient was febrile, tachycardic.  COVID-19 test was negative.  CT scan abdomen pelvis showed diffuse colitis without perforation or abscess.  Patient was admitted to the general medical floor and started on treatment.  Patient started becoming hypotensive and tachycardic on the evening of 07/25/2018 and she was transferred to stepdown.  She developed acute kidney injury, leukocytosis worsened and blood pressure further dropped and was transferred to ICU on 07/26/2018.  Since then she has been diagnosed with C. difficile colitis, acute kidney injury.  5/21 Admitted 5/23 Transferred to ICU for hypotension requiring vasopressors; DNR/DNI 5/24 off pressors; a fib with RVR, started amiodarone 5/26 sinus rhythm Transferred out of ICU.  Patient remains anuric. 5/27 patient has 100-150 mL urine output for last few days.  7 L positive balance and diuresing. 5/28, urine output 1150 mL through Foley catheter.  Assessment & Plan:   Principal Problem:   Clostridium difficile colitis Active Problems:   Rheumatoid arthritis ([place])   Diabetes mellitus ([place])   GERD (gastroesophageal reflux disease)   Diverticulitis   Colitis presumed infectious   Colitis   Shock ([place])  Shock: Septic shock versus hypovolemic shock from C. difficile colitis and sepsis present on admission. Currently adequately improved.   Hemodynamically improving.  Off all antibiotics and remains on oral vancomycin for 14 days. On a stress dose of IV steroids.  Blood cultures were negative.  C. difficile colitis: With persistent nausea.  Was able to tolerate some clear liquid diet.  Will advance to full liquid diet today.  No more loose stool.  Acute kidney injury with oliguric renal failure on chronic kidney disease stage III: Patient has significant positive balance and oliguria.   Seen by nephrology.  Started on high-dose Lasix challenge with improvement of urine output and renal functions.  Hypocalcemia: Her corrected calcium is normal.  Her albumin is 1.5.  Hypokalemia: We will continue oral replacement.  Recheck level in the morning.  Type 2 diabetes: Exacerbated by high steroid use and IV dextrose use.  Currently on Lantus and sliding scale insulin.  Blood sugars are better today.  Hypertension: Home blood pressure medications on hold.  Metoprolol as needed.  GERD: On PPI continue.  Rheumatoid arthritis: On Enbrel and prednisone at home.  Enbrel on hold.  Remains on high-dose IV steroids.  We will gradually taper.  Physical debility and deconditioning: Continue to work with PT OT now.  New onset A. fib with RVR in the setting of sepsis: Currently sinus rhythm.  Off amiodarone.  Will not favor anticoagulation.   DVT prophylaxis: Heparin subcu Code Status: DNR/DNI Family Communication: updated daughter [name] over the phone. Disposition Plan: Transfer to telemetry bed.   Consultants:   PCCM, signed off,  Nephrology   Procedures:   None  Antimicrobials:  Flagyl 5/21 >> 5/23 Zosyn 5/23 >> 5/25 Oral vancomycin 5/22 >> 5/24 Vancomycin enema 5/24 >> 5/26 Oral vancomycin 5/26 >>    Subjective: Patient  seen and examined. Complaints of difficulty swallowing. Feels congested.  Nebs helped. Remains sinus rhythm. She had 1 BM overnight.  Objective: Vitals:   07/31/18 0600 07/31/18 0700 07/31/18 0800  07/31/18 1027  BP: (!) 143/52 (!) 130/45 (!) 136/58   Pulse: 84 80 80   Resp: 10 13 13    Temp:   98.4 F (36.9 C)   TempSrc:   Oral   SpO2: 94% 96% 92% 96%  Weight:      Height:        Intake/Output Summary (Last 24 hours) at 07/31/2018 1259 Last data filed at 07/31/2018 0700 Gross per 24 hour  Intake 100.67 ml  Output 750 ml  Net -649.33 ml   Filed Weights   07/24/18 1958 07/31/18 0500  Weight: 57.2 kg 70.6 kg    Examination:  Physical Exam  Constitutional: She is oriented to person, place, and time. She appears well-developed. She appears distressed.  Chronically sick looking but fairly stable.  HENT:  Head: Normocephalic and atraumatic.  Mouth/Throat: Oropharynx is clear and moist.  Eyes: Pupils are equal, round, and reactive to light. Conjunctivae and EOM are normal.  Neck: Normal range of motion. Neck supple.  Central line left IJ.  To be removed today.  Cardiovascular: Normal rate and normal heart sounds.  No murmur heard. Pulmonary/Chest: Effort normal and breath sounds normal.  Occasional expiratory wheezes.  Mostly upper airway sounds.  Abdominal: Soft. Bowel sounds are normal. She exhibits no distension.  Musculoskeletal: Normal range of motion.        General: No deformity or edema.  Neurological: She is alert and oriented to person, place, and time.  Anxious  Skin: Skin is warm and dry.      Data Reviewed: I have personally reviewed following labs and imaging studies  CBC: Recent Labs  Lab 07/26/18 0249 07/27/18 0330 07/28/18 0440 07/29/18 0533 07/31/18 0516  WBC 23.2* 16.5* 19.7* 22.2* 11.8*  NEUTROABS 18.3*  --   --   --  9.3*  HGB 12.8 11.1* 10.7* 10.7* 10.7*  HCT 43.4 33.3* 32.0* 33.4* 33.1*  MCV 99.5 88.1 86.7 88.8 90.4  PLT 317 246 216 173 301*   Basic Metabolic Panel: Recent Labs  Lab 07/24/18 2339  07/26/18 0249  07/27/18 2000 07/28/18 0718 07/29/18 0533 07/30/18 0526 07/31/18 0516  NA  --    < > 138   < > 139 137 135 136  139  K  --    < > 4.6   < > 3.3* 3.2* 3.7 3.4* 3.1*  CL  --    < > 111   < > 98 92* 92* 95* 99  CO2  --    < > 9*   < > 25 28 26 29 26   GLUCOSE  --    < > 191*   < > 101* 108* 321* 222* 100*  BUN  --    < > 40*   < > 48* 48* 53* 65* 57*  CREATININE  --    < > 2.84*   < > 3.45* 3.69* 3.99* 3.98* 3.25*  CALCIUM  --    < > 6.2*   < > 5.8* 5.9* 6.1* 6.5* 6.4*  MG 1.8  --  2.1  --   --   --  1.9  --  1.9  PHOS  --   --  6.1*  --   --   --  5.8*  --   --    < > =  values in this interval not displayed.   GFR: Estimated Creatinine Clearance: 11.9 mL/min (A) (by C-G formula based on SCr of 3.25 mg/dL (H)). Liver Function Tests: Recent Labs  Lab 07/25/18 0614 07/26/18 1500 07/27/18 0330 07/27/18 1040 07/27/18 2000  AST 13* 15 19 22 25   ALT 11 11 11 12 13   ALKPHOS 35* 72 49 55 60  BILITOT 1.2 <0.1* 0.2* 0.3 0.5  PROT 4.9* 3.8* 4.0* 4.1* 4.1*  ALBUMIN 2.2* 1.5* 1.5* 1.6* 1.6*   No results for input(s): LIPASE, AMYLASE in the last 168 hours. No results for input(s): AMMONIA in the last 168 hours. Coagulation Profile: No results for input(s): INR, PROTIME in the last 168 hours. Cardiac Enzymes: Recent Labs  Lab 07/25/18 1719 07/29/18 0533 07/29/18 1020 07/29/18 1725  TROPONINI <0.03 0.03* <0.03 0.03*   BNP (last 3 results) No results for input(s): PROBNP in the last 8760 hours. HbA1C: No results for input(s): HGBA1C in the last 72 hours. CBG: Recent Labs  Lab 07/30/18 1255 07/30/18 1632 07/30/18 2202 07/31/18 0825 07/31/18 1240  GLUCAP 162* 98 118* 85 86   Lipid Profile: No results for input(s): CHOL, HDL, LDLCALC, TRIG, CHOLHDL, LDLDIRECT in the last 72 hours. Thyroid Function Tests: No results for input(s): TSH, T4TOTAL, FREET4, T3FREE, THYROIDAB in the last 72 hours. Anemia Panel: No results for input(s): VITAMINB12, FOLATE, FERRITIN, TIBC, IRON, RETICCTPCT in the last 72 hours. Sepsis Labs: Recent Labs  Lab 07/24/18 2111 07/24/18 2339 07/26/18 0857 07/26/18  1128  LATICACIDVEN 1.4 1.0 2.7* 1.7    Recent Results (from the past 240 hour(s))  Gastrointestinal Panel by PCR , Stool     Status: None   Collection Time: 07/24/18  4:40 PM  Result Value Ref Range Status   Campylobacter species NOT DETECTED NOT DETECTED Final   Plesimonas shigelloides NOT DETECTED NOT DETECTED Final   Salmonella species NOT DETECTED NOT DETECTED Final   Yersinia enterocolitica NOT DETECTED NOT DETECTED Final   Vibrio species NOT DETECTED NOT DETECTED Final   Vibrio cholerae NOT DETECTED NOT DETECTED Final   Enteroaggregative E coli (EAEC) NOT DETECTED NOT DETECTED Final   Enteropathogenic E coli (EPEC) NOT DETECTED NOT DETECTED Final   Enterotoxigenic E coli (ETEC) NOT DETECTED NOT DETECTED Final   Shiga like toxin producing E coli (STEC) NOT DETECTED NOT DETECTED Final   Shigella/Enteroinvasive E coli (EIEC) NOT DETECTED NOT DETECTED Final   Cryptosporidium NOT DETECTED NOT DETECTED Final   Cyclospora cayetanensis NOT DETECTED NOT DETECTED Final   Entamoeba histolytica NOT DETECTED NOT DETECTED Final   Giardia lamblia NOT DETECTED NOT DETECTED Final   Adenovirus F40/41 NOT DETECTED NOT DETECTED Final   Astrovirus NOT DETECTED NOT DETECTED Final   Norovirus GI/GII NOT DETECTED NOT DETECTED Final   Rotavirus A NOT DETECTED NOT DETECTED Final   Sapovirus (I, II, IV, and V) NOT DETECTED NOT DETECTED Final    Comment: Performed at [facility], [address] [place], [place] [address]  SARS Coronavirus 2 (Hosp order,Performed in [place] lab via [employer])     Status: None   Collection Time: 07/24/18  4:40 PM  Result Value Ref Range Status   SARS Coronavirus 2 ([employer] Now) NEGATIVE NEGATIVE Final    Comment: (NOTE) Interpretive Result Comment(s): COVID 19 Positive SARS CoV 2 target nucleic acids are DETECTED. The SARS CoV 2 RNA is generally detectable in upper and lower respiratory specimens during the acute phase of infection.  Positive  results are indicative  of active infection with SARS CoV 2.  Clinical correlation with patient history and other diagnostic information is necessary to determine patient infection status.  Positive results do not rule out bacterial infection or coinfection with other viruses. The expected result is Negative. COVID 19 Negative SARS CoV 2 target nucleic acids are NOT DETECTED. The SARS CoV 2 RNA is generally detectable in upper and lower respiratory specimens during the acute phase of infection.  Negative results do not preclude SARS CoV 2 infection, do not rule out coinfections with other pathogens, and should not be used as the sole basis for treatment or other patient management decisions.  Negative results must be combined with clinical  observations, patient history, and epidemiological information. The expected result is Negative. Invalid Presence or absence of SARS CoV 2 nucleic acids cannot be determined. Repeat testing was performed on the submitted specimen and repeated Invalid results were obtained.  If clinically indicated, additional testing on a new specimen with an alternate test methodology [phone]) is advised.  The SARS CoV 2 RNA is generally detectable in upper and lower respiratory specimens during the acute phase of infection. The expected result is Negative. Fact Sheet for Patients:  GolfingFamily.no Fact Sheet for Healthcare Providers: https://www.hernandez-brewer.com/ This test is not yet approved or cleared by the [place] FDA and has been authorized for detection and/or diagnosis of SARS CoV 2 by FDA under an Emergency Use Authorization (EUA).  This EUA will remain in effect (meaning this test can be used) for the duration of the COVID19 d eclaration under Section 564(b)(1) of the Act, 21 U.S.C. section [phone] 3(b)(1), unless the authorization is terminated or revoked sooner. Performed at [facility], [address] [place], [place] [address]   C difficile quick scan w PCR reflex     Status: Abnormal   Collection Time: 07/24/18  6:50 PM  Result Value Ref Range Status   C Diff antigen POSITIVE (A) NEGATIVE Final   C Diff toxin POSITIVE (A) NEGATIVE Final   C Diff interpretation Toxin producing C. difficile detected.  Final    Comment: CRITICAL RESULT CALLED TO, READ BACK BY AND VERIFIED WITH: [facility] RN 07/24/18 2224 JDW Performed at [facility], [place] [address] [place], [place] [address]   MRSA PCR Screening     Status: None   Collection Time: 07/25/18  6:07 PM  Result Value Ref Range Status   MRSA by PCR NEGATIVE NEGATIVE Final    Comment:        The GeneXpert MRSA Assay (FDA approved for NASAL specimens only), is one component of a comprehensive MRSA colonization surveillance program. It is not intended to diagnose MRSA infection nor to guide or monitor treatment for MRSA infections. Performed at [facility], [place] [address], [place], [place] [address]   Culture, blood (routine x 2)     Status: None (Preliminary result)   Collection Time: 07/26/18  8:57 AM  Result Value Ref Range Status   Specimen Description   Final    RIGHT ANTECUBITAL Performed at [facility] [address] [place], [place] [address]    Special Requests   Final    BOTTLES DRAWN AEROBIC ONLY Blood Culture adequate volume Performed at [facility] [address], [place], [place] [address]    Culture   Final    NO GROWTH 4 DAYS Performed at [facility], [place] [address] [place], [place] [address]  Report Status PENDING  Incomplete  Culture, blood (routine x 2)     Status: None (Preliminary result)   Collection Time: 07/26/18  8:57 AM  Result Value Ref Range Status   Specimen Description   Final    BLOOD LEFT HAND Performed at [place] [address], [place], [place] [address]    Special  Requests   Final    BOTTLES DRAWN AEROBIC ONLY Blood Culture results may not be optimal due to an inadequate volume of blood received in culture bottles Performed at [place] [address] [place], [place] [address]    Culture   Final    NO GROWTH 4 DAYS Performed at [facility], [place] [address] [place],  [address]    Report Status PENDING  Incomplete         Radiology Studies: US Renal  Result Date: 07/30/2018 CLINICAL DATA:  Renal failure. EXAM: RENAL / URINARY TRACT ULTRASOUND COMPLETE COMPARISON:  CT dated 07/24/2018 FINDINGS: Right Kidney: Renal measurements: 10.8 x 4.3 x 4.5 cm = volume: 111 mL . Echogenicity within normal limits. No mass or hydronephrosis visualized. A 1.9 cm cyst is noted Left Kidney: Renal measurements: 11 x 5.6 x 4.6 cm = volume: 149.9 mL. Echogenicity within normal limits. No mass or hydronephrosis visualized. A 1.3 cm cyst is noted. Bladder: Appears normal for degree of bladder distention. New mild-to-moderate abdominal ascites. IMPRESSION: 1. No acute sonographic abnormality detected. There is no hydronephrosis. 2. New mild-to-moderate abdominal ascites. Electronically Signed   By: Constance Holster M.D.   On: 07/30/2018 15:19   Dg Chest Port 1 View  Result Date: 07/29/2018 CLINICAL DATA:  Cough and vomiting EXAM: PORTABLE CHEST 1 VIEW COMPARISON:  07/29/2018 FINDINGS: Cardiac shadow is stable. Left jugular central line is again noted at the cavoatrial junction. The lungs are well aerated bilaterally. Small pleural effusions are seen bilaterally with left basilar atelectasis. No other focal abnormality is noted. IMPRESSION: Small effusions with left basilar atelectasis. No new focal abnormality is noted. Electronically Signed   By: Inez Catalina M.D.   On: 07/29/2018 19:32        Scheduled Meds: . Chlorhexidine Gluconate Cloth  6 each Topical Daily  . Gerhardt's butt cream   Topical BID  . heparin injection  (subcutaneous)  5,000 Units Subcutaneous Q8H  . hydrocortisone sod succinate (SOLU-CORTEF) inj  50 mg Intravenous Q12H  . insulin aspart  0-9 Units Subcutaneous TID WC  . insulin glargine  10 Units Subcutaneous QHS  . mouth rinse  15 mL Mouth Rinse BID  . metolazone  2.5 mg Oral Daily  . metoprolol tartrate  2.5 mg Intravenous Q8H  . nystatin   Topical BID  . potassium chloride  20 mEq Oral BID  . sodium chloride flush  10-40 mL Intracatheter Q12H  . vancomycin  125 mg Oral QID   Continuous Infusions: . furosemide       LOS: 7 days    Time spent: 25 minutes    Barb Merino, MD [facility] Pager [phone]  If 7PM-7AM, please contact night-coverage [email or web address] Password [facility] 07/31/2018, 12:59 PM

## 2018-07-31 NOTE — Progress Notes (Signed)
[facility] Progress Note  Subjective: no new c/o's, 1L UOP on IV lasix, looks a little better today  Vitals:   07/31/18 0400 07/31/18 0500 07/31/18 0600 07/31/18 0700  BP: (!) 114/58 (!) 86/53 (!) 143/52 (!) 130/45  Pulse: 75 70 84 80  Resp: '13 12 10 13  '$ Temp: 98.8 F (37.1 C)     TempSrc: Axillary     SpO2: 93% 92% 94% 96%  Weight:  70.6 kg    Height:        Inpatient medications: . Chlorhexidine Gluconate Cloth  6 each Topical Daily  . Gerhardt's butt cream   Topical BID  . heparin injection (subcutaneous)  5,000 Units Subcutaneous Q8H  . hydrocortisone sod succinate (SOLU-CORTEF) inj  50 mg Intravenous Q12H  . insulin aspart  0-9 Units Subcutaneous TID WC  . insulin glargine  10 Units Subcutaneous QHS  . mouth rinse  15 mL Mouth Rinse BID  . metoprolol tartrate  2.5 mg Intravenous Q8H  . nystatin   Topical BID  . potassium chloride  20 mEq Oral BID  . sodium chloride flush  10-40 mL Intracatheter Q12H  . vancomycin  125 mg Oral QID   . furosemide 100 mg (07/31/18 0700)   acetaminophen **OR** acetaminophen, albuterol, morphine injection, nitroGLYCERIN, ondansetron **OR** ondansetron (ZOFRAN) IV, polyvinyl alcohol, sodium chloride flush    Exam: Gen alert, lying flat, elderly calm No jvd or bruits Chest some rales R base, L clear RRR no MRG Abd soft ntnd no mass or ascites +bs Ext diffuse 2+ pitting LE edema, feet to hips Neuro is alert, Ox 3 , nf, gen'd weak    Home meds:  - aspirin 325/ atorvastatin 20  - prednisone 5/ etanercept '50mg'$  weekly  - esomeprazole 40 qd  - insulin NPH 20 hs  - metoprolol xl 25 qd/ diltiazem 60 hs  - prn's/ vitamins/ supplements   Date               Creat               eGFR  07/09/18             1.49                 37  07/24/18 admit 1.22  5/23                2.92  07/30/18           3.98   CXR's - no acute disease  Renal US - no sig abnormalities, no hydro. 10-11 cm kidneys.   Assessment/ Plan: 1. AKI on  CKD 3 - baseline creat 1.2- 1.5. Here pt got lots of IVF's and became sig vol overloaded, creat went up and UOP went down. With IV lasix is starting to diurese and creat down today. Not uremic, no acute indication for dialysis. UA negative and US wnl.  Will ^lasix 120 tid IV and add po metolazone 2.5 qd, cont to diurese.   2. Vol overload - as above 3. Cdif infection/ colitis/ diarrhea - improving on po vanc 4. RA/ psoriasis 5. DM2 on insulin              [facility] 07/31/2018, 7:25 AM  Iron/TIBC/Ferritin/ %Sat No results found for: IRON, TIBC, FERRITIN, IRONPCTSAT Recent Labs  Lab 07/27/18 2000  07/29/18 0533  07/31/18 0516  NA 139   < > 135   < > 139  K 3.3*   < >  3.7   < > 3.1*  CL 98   < > 92*   < > 99  CO2 25   < > 26   < > 26  GLUCOSE 101*   < > 321*   < > 100*  BUN 48*   < > 53*   < > 57*  CREATININE 3.45*   < > 3.99*   < > 3.25*  CALCIUM 5.8*   < > 6.1*   < > 6.4*  PHOS  --   --  5.8*  --   --   ALBUMIN 1.6*  --   --   --   --    < > = values in this interval not displayed.   Recent Labs  Lab 07/27/18 2000  AST 25  ALT 13  ALKPHOS 60  BILITOT 0.5  PROT 4.1*   Recent Labs  Lab 07/31/18 0516  WBC 11.8*  HGB 10.7*  HCT 33.1*  PLT 140*

## 2018-08-01 ENCOUNTER — Ambulatory Visit: Payer: Medicare Other | Admitting: Internal Medicine

## 2018-08-01 LAB — BASIC METABOLIC PANEL
Anion gap: 13 (ref 5–15)
BUN: 65 mg/dL — ABNORMAL HIGH (ref 8–23)
CO2: 27 mmol/L (ref 22–32)
Calcium: 7.3 mg/dL — ABNORMAL LOW (ref 8.9–10.3)
Chloride: 100 mmol/L (ref 98–111)
Creatinine, Ser: 2.88 mg/dL — ABNORMAL HIGH (ref 0.44–1.00)
GFR calc Af Amer: 17 mL/min — ABNORMAL LOW (ref >60)
GFR calc non Af Amer: 14 mL/min — ABNORMAL LOW (ref >60)
Glucose, Bld: 172 mg/dL — ABNORMAL HIGH (ref 70–99)
Potassium: 4.1 mmol/L (ref 3.5–5.1)
Sodium: 140 mmol/L (ref 135–145)

## 2018-08-01 LAB — GLUCOSE, CAPILLARY
Glucose-Capillary: 154 mg/dL — ABNORMAL HIGH (ref 70–99)
Glucose-Capillary: 141 mg/dL — ABNORMAL HIGH (ref 70–99)
Glucose-Capillary: 156 mg/dL — ABNORMAL HIGH (ref 70–99)

## 2018-08-01 MED ORDER — PREDNISONE 20 MG PO TABS
40.0000 mg | ORAL_TABLET | Freq: Every day | ORAL | Status: DC
  Administered 2018-08-02 – 2018-08-05 (×4): 40 mg via ORAL
  Filled 2018-08-01 (×4): qty 2

## 2018-08-01 MED ORDER — DILTIAZEM HCL 30 MG PO TABS
30.0000 mg | ORAL_TABLET | Freq: Three times a day (TID) | ORAL | Status: DC
  Administered 2018-08-01 – 2018-08-03 (×6): 30 mg via ORAL
  Filled 2018-08-01 (×7): qty 1

## 2018-08-01 MED ORDER — METOLAZONE 5 MG PO TABS
5.0000 mg | ORAL_TABLET | Freq: Two times a day (BID) | ORAL | Status: DC
  Administered 2018-08-01 – 2018-08-02 (×2): 5 mg via ORAL
  Filled 2018-08-01 (×2): qty 1

## 2018-08-01 MED ORDER — METOPROLOL TARTRATE 25 MG PO TABS
25.0000 mg | ORAL_TABLET | Freq: Two times a day (BID) | ORAL | Status: DC
  Administered 2018-08-01 – 2018-08-02 (×4): 25 mg via ORAL
  Filled 2018-08-01 (×5): qty 1

## 2018-08-01 MED ORDER — FUROSEMIDE 10 MG/ML IJ SOLN
160.0000 mg | Freq: Four times a day (QID) | INTRAVENOUS | Status: DC
  Administered 2018-08-01 – 2018-08-02 (×4): 160 mg via INTRAVENOUS
  Filled 2018-08-01 (×5): qty 16

## 2018-08-01 NOTE — Evaluation (Signed)
Occupational Therapy Evaluation Patient Details Name: Shelby Walker MRN: [id number] DOB: 09/28/1933 Today's Date: 08/01/2018    History of Present Illness 85 year old female who presented with nausea, vomiting and diarrhea x 3 weeks and admitted with sepsis secondary to C. Diff colitis.  PMHx of DM, HTN, GERD, RA   Clinical Impression   Pt admitted with above diagnoses, with generalized weakness and decreased activity tolerance limiting ability to engage in BADL at desired level of ind. PTA pt reports a recent fall, but overall independence in BADL.She was receiving IADL assistance due to the COVID-19 pandemic and shares that her husband is not in great health, but her daughter and son in law assist. Pt has a history of RA, with deformities in B hands. At time of eval pt reporting feeling weak, per PT pt having chest pain during her session, but RN cleared pt for therapy. Incontinence of stool noted at start of session; requiring min A to roll and +2 for bed level clean up. Pt currently max A +2 for supine > sit; max A +2 stand pivot to recliner. At this time recommend SNF level of care at d/c to maximize safe and functional BADL progress. Will continue to follow pt for OT per POC.     Follow Up Recommendations  SNF;Supervision/Assistance - 24 hour    Equipment Recommendations  Other (comment)(defer to next venue)    Recommendations for Other Services       Precautions / Restrictions Precautions Precautions: Fall Precaution Comments: HR and dizziness Restrictions Weight Bearing Restrictions: No      Mobility Bed Mobility Overal bed mobility: Needs Assistance Bed Mobility: Supine to Sit;Rolling Rolling: Min assist     Sit to supine: Max assist;+2 for physical assistance   General bed mobility comments: assist for BLE management, assist to rise trunk; VC's needed for sequencing- pt min A for rolling for peri care  Transfers Overall transfer level: Needs assistance Equipment  used: 2 person hand held assist(2 person side by side) Transfers: Stand Pivot Transfers   Stand pivot transfers: Max assist;+2 safety/equipment;+2 physical assistance       General transfer comment: max A +2 to recliner chair; pt difficulty maintaining full standing due to generalized LE weakness    Balance Overall balance assessment: Needs assistance Sitting-balance support: No upper extremity supported;Feet unsupported Sitting balance-Leahy Scale: Fair     Standing balance support: Bilateral upper extremity supported Standing balance-Leahy Scale: Zero Standing balance comment: requiring max A +2 to maintain balance                           ADL either performed or assessed with clinical judgement   ADL Overall ADL's : Needs assistance/impaired Eating/Feeding: Set up;Sitting;Bed level   Grooming: Minimal assistance;Sitting Grooming Details (indicate cue type and reason): to don chapstick up in chair Upper Body Bathing: Moderate assistance;Sitting   Lower Body Bathing: Total assistance;Sitting/lateral leans;Sit to/from stand   Upper Body Dressing : Moderate assistance;Sitting   Lower Body Dressing: Total assistance;Sitting/lateral leans;Sit to/from stand   Toilet Transfer: +2 for safety/equipment;+2 for physical assistance;Maximal assistance Toilet Transfer Details (indicate cue type and reason): simulated with recliner Toileting- Clothing Manipulation and Hygiene: Total assistance Toileting - Clothing Manipulation Details (indicate cue type and reason): incontinent of stool; on foley Tub/ Shower Transfer: Total assistance   Functional mobility during ADLs: Maximal assistance;+2 for physical assistance;+2 for safety/equipment(stand pivot t/f) General ADL Comments: pt currently requiring max A +2  for OOB transfers; generalized weakness and debility affecting all ADL participation at this time     Vision Patient Visual Report: No change from baseline        Perception     Praxis      Pertinent Vitals/Pain Pain Assessment: No/denies pain     Hand Dominance     Extremity/Trunk Assessment Upper Extremity Assessment Upper Extremity Assessment: Generalized weakness(hand deformities from RA)   Lower Extremity Assessment Lower Extremity Assessment: Generalized weakness       Communication Communication Communication: No difficulties   Cognition Arousal/Alertness: Awake/alert Behavior During Therapy: WFL for tasks assessed/performed Overall Cognitive Status: Within Functional Limits for tasks assessed                                     General Comments  edema in BLEs and pubic region     Exercises     Shoulder Instructions      Home Living Family/patient expects to be discharged to:: Private residence Living Arrangements: Spouse/significant other Available Help at Discharge: Family;Friend(s)(dtr and son law) Type of Home: House Home Access: Other (comment)     Home Layout: Able to live on main level with bedroom/bathroom;Laundry or work area in Water quality scientist to basement)     Bathroom Shower/Tub: Tub/shower unit;Walk-in shower(both but mostly uses walk in)   [employer]: Standard     Home Equipment: Environmental consultant - 2 wheels;Cane - single point          Prior Functioning/Environment Level of Independence: Independent        Comments: was not going to grocery store due to [place] pandemic; family providing meal        OT Problem List: Decreased strength;Decreased activity tolerance;Decreased knowledge of use of DME or AE;Impaired balance (sitting and/or standing);Decreased coordination;Increased edema      OT Treatment/Interventions: Self-care/ADL training;Energy conservation;Balance training;Therapeutic exercise;DME and/or AE instruction;Therapeutic activities;Patient/family education    OT Goals(Current goals can be found in the care plan section) Acute Rehab OT Goals Patient Stated  Goal: to get stronger OT Goal Formulation: With patient Time For Goal Achievement: 08/15/18 Potential to Achieve Goals: Good  OT Frequency: Min 2X/week   Barriers to D/C:            Co-evaluation              AM-PAC OT "6 Clicks" Daily Activity     Outcome Measure Help from another person eating meals?: None Help from another person taking care of personal grooming?: A Little Help from another person toileting, which includes using toliet, bedpan, or urinal?: Total Help from another person bathing (including washing, rinsing, drying)?: Total Help from another person to put on and taking off regular upper body clothing?: A Lot Help from another person to put on and taking off regular lower body clothing?: Total 6 Click Score: 12   End of Session Equipment Utilized During Treatment: Gait belt Nurse Communication: Mobility status  Activity Tolerance: Patient tolerated treatment well Patient left: in chair;with call bell/phone within reach;with chair alarm set;with nursing/sitter in room  OT Visit Diagnosis: Other abnormalities of gait and mobility (R26.89);Unsteadiness on feet (R26.81);Muscle weakness (generalized) (M62.81);History of falling (Z91.81)                Time: 9562-1308 OT Time Calculation (min): 35 min Charges:  OT General Charges $OT Visit: 1 Visit OT Evaluation $OT Eval Moderate Complexity: 1 Mod OT  Treatments $Self Care/Home Management : 8-22 mins  Zenovia Jarred, MSOT, OTR/L Behavioral Health OT/ Acute Relief OT [facility] Office: [place] 08/01/2018, 6:08 PM

## 2018-08-01 NOTE — Care Management Important Message (Signed)
Important Message  Patient Details IM Letter given to Nancy Marus RN to present to the Patient Name: JERELINE TICER MRN: [id number] Date of Birth: 08-Dec-1933   Medicare Important Message Given:  Yes    Kerin Salen 08/01/2018, 10:44 AM

## 2018-08-01 NOTE — Progress Notes (Signed)
Pt transferred to 1404 and care was resumed by me. I agree with previous RN assessment. Will continue to monitor.

## 2018-08-01 NOTE — Progress Notes (Addendum)
Physical Therapy Treatment Patient Details Name: Shelby Walker MRN: [id number] DOB: Nov 01, 1933 Today's Date: 08/01/2018    History of Present Illness 85 year old female who presented with nausea, vomiting and diarrhea x 3 weeks and admitted with sepsis secondary to C. Diff colitis.  PMHx of DM, HTN, GERD, RA    PT Comments    +2 max assist for supine to sit and sit to supine. Pt c/o new chest pain, onset this morning, worse with inspiration, non radiating,  so did not attempt transfers. RN notified of pt c/o chest pain. HR 110, BP 128/56, SaO2 96% on room air.   Follow Up Recommendations  SNF;Supervision/Assistance - 24 hour (per case management note, family prefers home with HHPT)     Equipment Recommendations  If pt DCs home, will likely need wheelchair and wheelchair cushion, possibly hoyer lift, depending on progress and level of assistance available at home   Recommendations for Other Services       Precautions / Restrictions Precautions Precautions: Fall Restrictions Weight Bearing Restrictions: No    Mobility  Bed Mobility Overal bed mobility: Needs Assistance Bed Mobility: Sit to Supine;Supine to Sit     Supine to sit: Max assist;+2 for physical assistance Sit to supine: Max assist;+2 for physical assistance   General bed mobility comments: assist to raise trunk, assist to control trunk descent; pt c/o chest pain so returned to supine. HR 110, BP 128/56, SaO2 96% room air, RN notified of chest pain  Transfers                 General transfer comment: did not attempt 2* pt reported chest pain  Ambulation/Gait                 Stairs             Wheelchair Mobility    Modified Rankin (Stroke Patients Only)       Balance Overall balance assessment: Needs assistance Sitting-balance support: No upper extremity supported;Feet unsupported Sitting balance-Leahy Scale: Fair                                      Cognition  Arousal/Alertness: Awake/alert Behavior During Therapy: WFL for tasks assessed/performed Overall Cognitive Status: Within Functional Limits for tasks assessed                                        Exercises      General Comments        Pertinent Vitals/Pain Faces Pain Scale: Hurts little more Pain Location: chest with inspiration Pain Descriptors / Indicators: Tightness Pain Intervention(s): Limited activity within patient's tolerance;Monitored during session;Other (comment)(RN notified)    Home Living                      Prior Function            PT Goals (current goals can now be found in the care plan section) Acute Rehab PT Goals PT Goal Formulation: With patient Time For Goal Achievement: 08/05/18 Potential to Achieve Goals: Fair Progress towards PT goals: Not progressing toward goals - comment(chest pain )    Frequency    Min 3X/week      PT Plan Current plan remains appropriate    Co-evaluation  AM-PAC PT "6 Clicks" Mobility   Outcome Measure  Help needed turning from your back to your side while in a flat bed without using bedrails?: A Little Help needed moving from lying on your back to sitting on the side of a flat bed without using bedrails?: A Lot Help needed moving to and from a bed to a chair (including a wheelchair)?: Total Help needed standing up from a chair using your arms (e.g., wheelchair or bedside chair)?: Total Help needed to walk in hospital room?: Total Help needed climbing 3-5 steps with a railing? : Total 6 Click Score: 9    End of Session   Activity Tolerance: Treatment limited secondary to medical complications (Comment)(pt c/o chest pain) Patient left: in bed;with call bell/phone within reach;with nursing/sitter in room Nurse Communication: Mobility status;Other (comment)(chest pain) PT Visit Diagnosis: Other abnormalities of gait and mobility (R26.89)     Time: 5409-8119 PT  Time Calculation (min) (ACUTE ONLY): 11 min  Charges:  $Therapeutic Activity: 8-22 mins                     Shelby Walker PT 08/01/2018  Acute Rehabilitation Services Pager [phone] Office [phone]

## 2018-08-01 NOTE — Progress Notes (Signed)
Daughter and Son called and updated.

## 2018-08-01 NOTE — Progress Notes (Signed)
PROGRESS NOTE    Shelby Walker  WVP:[id number] DOB: 12/08/1933 DOA: 07/24/2018 PCP: Reynold Bowen, MD    Brief Narrative:   Patient is 85 year old female with history of type 2 diabetes on oral hypoglycemics, hypertension, hyperlipidemia, GERD and rheumatoid arthritis on chronic prednisone therapy who presented to the hospital on 07/24/2018 with nausea, vomiting, diarrhea ongoing for about 3 weeks.  Patient was seen at urgent care and diagnosed with infectious colitis and had completed 7 days course of ciprofloxacin and Flagyl without improvement of symptoms.  On arrival to the emergency room, patient was febrile, tachycardic.  COVID-19 test was negative.  CT scan abdomen pelvis showed diffuse colitis without perforation or abscess.  Patient was admitted to the general medical floor and started on treatment.  Patient started becoming hypotensive and tachycardic on the evening of 07/25/2018 and she was transferred to stepdown.  She developed acute kidney injury, leukocytosis worsened and blood pressure further dropped and was transferred to ICU on 07/26/2018.  Since then she has been diagnosed with C. difficile colitis, acute kidney injury.  5/21 Admitted 5/23 Transferred to ICU for hypotension requiring vasopressors; DNR/DNI 5/24 off pressors; a fib with RVR, started amiodarone 5/26 sinus rhythm Transferred out of ICU.  Patient remains anuric. 5/27 patient has 100-150 mL urine output for last few days.  14 L positive balance and diuresing. Diuresing with improvement.  Assessment & Plan:   Principal Problem:   Clostridium difficile colitis Active Problems:   Rheumatoid arthritis ([place])   Diabetes mellitus ([place])   GERD (gastroesophageal reflux disease)   Diverticulitis   Colitis presumed infectious   Colitis   Shock ([place])  Shock: Septic shock versus hypovolemic shock from C. difficile colitis and sepsis present on admission. Currently adequately improved.  Hemodynamically improving.  Off  all antibiotics and remains on oral vancomycin for day 4/14 days. On stress dose of IV steroids.  We will gradually decrease doses of steroids.  Change to oral prednisone 40 mg starting tomorrow morning. Blood cultures were negative.  C. difficile colitis: Clinically improving.  Will allow regular diet and see response.  Has occasional loose stool.  Acute kidney injury with oliguric renal failure on chronic kidney disease stage III: Patient has significant positive balance and was treated by IV Lasix with improvement. Seen by nephrology.  Started on high-dose Lasix challenge with improvement of urine output and renal functions. Continues to improve.  Hypocalcemia: Her corrected calcium is normal.  Continues to improve.  Hypokalemia: We will continue oral replacement.  Normalized.  Type 2 diabetes: Exacerbated by high steroid use and IV dextrose use.  Currently on Lantus and sliding scale insulin.  Blood sugars are better today.  Hypertension: Home blood pressure medications on hold.  Resume metoprolol and Cardizem.  GERD: On PPI continue.  Rheumatoid arthritis: On Enbrel and prednisone at home.  Enbrel on hold.  Remains on high-dose IV steroids.  We will gradually taper.  Change to oral tomorrow.  Physical debility and deconditioning: Continue to work with PT OT now.  Recommended SNF but patient wants to go home.  A. fib with RVR in the setting of sepsis: Patient known paroxysmal A. fib.  Treated with amiodarone in ICU.   Recurrent A. fib with RVR today.  Heart rate 120.  Blood pressure is stable. Resume her home dose of metoprolol and Cardizem today.  Will monitor.   DVT prophylaxis: Heparin subcu Code Status: DNR/DNI Family Communication: Will update daughter over the phone. Disposition Plan: Inpatient.   Consultants:  PCCM, signed off,  Nephrology   Procedures:   None  Antimicrobials:  Flagyl 5/21 >> 5/23 Zosyn 5/23 >> 5/25 Oral vancomycin 5/22 >> 5/24 Vancomycin  enema 5/24 >> 5/26 Oral vancomycin 5/26 >>    Subjective: Patient seen and examined.  Patient developed A. fib with RVR in the morning.  She had some discomfort on her chest.  She was able to eat.  She had 1 loose bowel movement in the morning.  Denies any abdominal pain.  She was able to swallow better. Urine output 925 mL last 24 hours.  Creatinine keeps improving.   Objective: Vitals:   07/31/18 2138 08/01/18 0422 08/01/18 0600 08/01/18 0915  BP: (!) 148/59 139/65  (!) 128/56  Pulse: 86 96  (!) 110  Resp: 12 16    Temp: 98.6 F (37 C) 98.9 F (37.2 C)    TempSrc: Oral     SpO2: 94% 94%  96%  Weight:   73.9 kg   Height:        Intake/Output Summary (Last 24 hours) at 08/01/2018 1010 Last data filed at 08/01/2018 0700 Gross per 24 hour  Intake 850.31 ml  Output 925 ml  Net -74.69 ml   Filed Weights   07/24/18 1958 07/31/18 0500 08/01/18 0600  Weight: 57.2 kg 70.6 kg 73.9 kg    Examination:  Physical Exam  Constitutional: She is oriented to person, place, and time. She appears well-developed and well-nourished. No distress.  HENT:  Head: Normocephalic and atraumatic.  Mouth/Throat: No oropharyngeal exudate.  Eyes: Pupils are equal, round, and reactive to light. Conjunctivae are normal.  Neck: Normal range of motion. Neck supple.  Cardiovascular: Normal heart sounds.  Irregularly irregular.  Pulmonary/Chest: Effort normal and breath sounds normal. No respiratory distress.  Abdominal: Soft. Bowel sounds are normal. She exhibits no distension. There is no abdominal tenderness.  Musculoskeletal: Normal range of motion.        General: Edema present.     Comments: Pigmentation and 1+ pedal edema.  Lymphadenopathy:    She has no cervical adenopathy.  Neurological: She is alert and oriented to person, place, and time.  Skin: Skin is warm and dry.  Psychiatric: She has a normal mood and affect. Judgment normal.      Data Reviewed: I have personally reviewed  following labs and imaging studies  CBC: Recent Labs  Lab 07/26/18 0249 07/27/18 0330 07/28/18 0440 07/29/18 0533 07/31/18 0516  WBC 23.2* 16.5* 19.7* 22.2* 11.8*  NEUTROABS 18.3*  --   --   --  9.3*  HGB 12.8 11.1* 10.7* 10.7* 10.7*  HCT 43.4 33.3* 32.0* 33.4* 33.1*  MCV 99.5 88.1 86.7 88.8 90.4  PLT 317 246 216 173 580*   Basic Metabolic Panel: Recent Labs  Lab 07/26/18 0249  07/28/18 0718 07/29/18 0533 07/30/18 0526 07/31/18 0516 08/01/18 0532  NA 138   < > 137 135 136 139 140  K 4.6   < > 3.2* 3.7 3.4* 3.1* 4.1  CL 111   < > 92* 92* 95* 99 100  CO2 9*   < > 28 26 29 26 27   GLUCOSE 191*   < > 108* 321* 222* 100* 172*  BUN 40*   < > 48* 53* 65* 57* 65*  CREATININE 2.84*   < > 3.69* 3.99* 3.98* 3.25* 2.88*  CALCIUM 6.2*   < > 5.9* 6.1* 6.5* 6.4* 7.3*  MG 2.1  --   --  1.9  --  1.9  --  PHOS 6.1*  --   --  5.8*  --   --   --    < > = values in this interval not displayed.   GFR: Estimated Creatinine Clearance: 13.8 mL/min (A) (by C-G formula based on SCr of 2.88 mg/dL (H)). Liver Function Tests: Recent Labs  Lab 07/26/18 1500 07/27/18 0330 07/27/18 1040 07/27/18 2000  AST 15 19 22 25   ALT 11 11 12 13   ALKPHOS 72 49 55 60  BILITOT <0.1* 0.2* 0.3 0.5  PROT 3.8* 4.0* 4.1* 4.1*  ALBUMIN 1.5* 1.5* 1.6* 1.6*   No results for input(s): LIPASE, AMYLASE in the last 168 hours. No results for input(s): AMMONIA in the last 168 hours. Coagulation Profile: No results for input(s): INR, PROTIME in the last 168 hours. Cardiac Enzymes: Recent Labs  Lab 07/25/18 1719 07/29/18 0533 07/29/18 1020 07/29/18 1725  TROPONINI <0.03 0.03* <0.03 0.03*   BNP (last 3 results) No results for input(s): PROBNP in the last 8760 hours. HbA1C: No results for input(s): HGBA1C in the last 72 hours. CBG: Recent Labs  Lab 07/31/18 0825 07/31/18 1240 07/31/18 1752 07/31/18 2135 08/01/18 0823  GLUCAP 85 86 95 135* 154*   Lipid Profile: No results for input(s): CHOL, HDL,  LDLCALC, TRIG, CHOLHDL, LDLDIRECT in the last 72 hours. Thyroid Function Tests: No results for input(s): TSH, T4TOTAL, FREET4, T3FREE, THYROIDAB in the last 72 hours. Anemia Panel: No results for input(s): VITAMINB12, FOLATE, FERRITIN, TIBC, IRON, RETICCTPCT in the last 72 hours. Sepsis Labs: Recent Labs  Lab 07/26/18 0857 07/26/18 1128  LATICACIDVEN 2.7* 1.7    Recent Results (from the past 240 hour(s))  Gastrointestinal Panel by PCR , Stool     Status: None   Collection Time: 07/24/18  4:40 PM  Result Value Ref Range Status   Campylobacter species NOT DETECTED NOT DETECTED Final   Plesimonas shigelloides NOT DETECTED NOT DETECTED Final   Salmonella species NOT DETECTED NOT DETECTED Final   Yersinia enterocolitica NOT DETECTED NOT DETECTED Final   Vibrio species NOT DETECTED NOT DETECTED Final   Vibrio cholerae NOT DETECTED NOT DETECTED Final   Enteroaggregative E coli (EAEC) NOT DETECTED NOT DETECTED Final   Enteropathogenic E coli (EPEC) NOT DETECTED NOT DETECTED Final   Enterotoxigenic E coli (ETEC) NOT DETECTED NOT DETECTED Final   Shiga like toxin producing E coli (STEC) NOT DETECTED NOT DETECTED Final   Shigella/Enteroinvasive E coli (EIEC) NOT DETECTED NOT DETECTED Final   Cryptosporidium NOT DETECTED NOT DETECTED Final   Cyclospora cayetanensis NOT DETECTED NOT DETECTED Final   Entamoeba histolytica NOT DETECTED NOT DETECTED Final   Giardia lamblia NOT DETECTED NOT DETECTED Final   Adenovirus F40/41 NOT DETECTED NOT DETECTED Final   Astrovirus NOT DETECTED NOT DETECTED Final   Norovirus GI/GII NOT DETECTED NOT DETECTED Final   Rotavirus A NOT DETECTED NOT DETECTED Final   Sapovirus (I, II, IV, and V) NOT DETECTED NOT DETECTED Final    Comment: Performed at [facility], [address] [place], [place] [address]  SARS Coronavirus 2 (Hosp order,Performed in [facility] lab via [employer])     Status: None   Collection Time: 07/24/18  4:40 PM  Result Value Ref  Range Status   SARS Coronavirus 2 ([employer] Now) NEGATIVE NEGATIVE Final    Comment: (NOTE) Interpretive Result Comment(s): COVID 19 Positive SARS CoV 2 target nucleic acids are DETECTED. The SARS CoV 2 RNA is generally detectable in upper and lower respiratory specimens during the  acute phase of infection.  Positive results are indicative of active infection with SARS CoV 2.  Clinical correlation with patient history and other diagnostic information is necessary to determine patient infection status.  Positive results do not rule out bacterial infection or coinfection with other viruses. The expected result is Negative. COVID 19 Negative SARS CoV 2 target nucleic acids are NOT DETECTED. The SARS CoV 2 RNA is generally detectable in upper and lower respiratory specimens during the acute phase of infection.  Negative results do not preclude SARS CoV 2 infection, do not rule out coinfections with other pathogens, and should not be used as the sole basis for treatment or other patient management decisions.  Negative results must be combined with clinical  observations, patient history, and epidemiological information. The expected result is Negative. Invalid Presence or absence of SARS CoV 2 nucleic acids cannot be determined. Repeat testing was performed on the submitted specimen and repeated Invalid results were obtained.  If clinically indicated, additional testing on a new specimen with an alternate test methodology [phone]) is advised.  The SARS CoV 2 RNA is generally detectable in upper and lower respiratory specimens during the acute phase of infection. The expected result is Negative. Fact Sheet for Patients:  GolfingFamily.no Fact Sheet for Healthcare Providers: https://www.hernandez-brewer.com/ This test is not yet approved or cleared by the [place] FDA and has been authorized for detection and/or diagnosis of SARS CoV 2 by FDA  under an Emergency Use Authorization (EUA).  This EUA will remain in effect (meaning this test can be used) for the duration of the COVID19 d eclaration under Section 564(b)(1) of the Act, 21 U.S.C. section [phone] 3(b)(1), unless the authorization is terminated or revoked sooner. Performed at [facility], [address] [place], [place] [address]   C difficile quick scan w PCR reflex     Status: Abnormal   Collection Time: 07/24/18  6:50 PM  Result Value Ref Range Status   C Diff antigen POSITIVE (A) NEGATIVE Final   C Diff toxin POSITIVE (A) NEGATIVE Final   C Diff interpretation Toxin producing C. difficile detected.  Final    Comment: CRITICAL RESULT CALLED TO, READ BACK BY AND VERIFIED WITH: Harlen [facility] RN 07/24/18 2224 JDW Performed at [facility], [place] [address], [place], [place] [address]   MRSA PCR Screening     Status: None   Collection Time: 07/25/18  6:07 PM  Result Value Ref Range Status   MRSA by PCR NEGATIVE NEGATIVE Final    Comment:        The GeneXpert MRSA Assay (FDA approved for NASAL specimens only), is one component of a comprehensive MRSA colonization surveillance program. It is not intended to diagnose MRSA infection nor to guide or monitor treatment for MRSA infections. Performed at [facility], [place] [address] [place], [place] [address]   Culture, blood (routine x 2)     Status: None   Collection Time: 07/26/18  8:57 AM  Result Value Ref Range Status   Specimen Description   Final    RIGHT ANTECUBITAL Performed at [facility] [address], [place], [place] [address]    Special Requests   Final    BOTTLES DRAWN AEROBIC ONLY Blood Culture adequate volume Performed at [facility] [address], [place], [place] [address]    Culture   Final    NO GROWTH 5 DAYS Performed at [facility], [address] [place], [place] [address]    Report Status 07/31/2018  FINAL  Final  Culture, blood (routine x 2)     Status: None   Collection Time: 07/26/18  8:57 AM  Result Value Ref Range Status   Specimen Description   Final    BLOOD LEFT HAND Performed at [facility] [address] [place], [place] [address]    Special Requests   Final    BOTTLES DRAWN AEROBIC ONLY Blood Culture results may not be optimal due to an inadequate volume of blood received in culture bottles Performed at [facility] [address], [place], [place] [address]    Culture   Final    NO GROWTH 5 DAYS Performed at [name], [place] [address] [place], [place] [address]    Report Status 07/31/2018 FINAL  Final         Radiology Studies: US Renal  Result Date: 07/30/2018 CLINICAL DATA:  Renal failure. EXAM: RENAL / URINARY TRACT ULTRASOUND COMPLETE COMPARISON:  CT dated 07/24/2018 FINDINGS: Right Kidney: Renal measurements: 10.8 x 4.3 x 4.5 cm = volume: 111 mL . Echogenicity within normal limits. No mass or hydronephrosis visualized. A 1.9 cm cyst is noted Left Kidney: Renal measurements: 11 x 5.6 x 4.6 cm = volume: 149.9 mL. Echogenicity within normal limits. No mass or hydronephrosis visualized. A 1.3 cm cyst is noted. Bladder: Appears normal for degree of bladder distention. New mild-to-moderate abdominal ascites. IMPRESSION: 1. No acute sonographic abnormality detected. There is no hydronephrosis. 2. New mild-to-moderate abdominal ascites. Electronically Signed   By: Constance Holster M.D.   On: 07/30/2018 15:19        Scheduled Meds: . Chlorhexidine Gluconate Cloth  6 each Topical Daily  . diltiazem  30 mg Oral Q8H  . Gerhardt's butt cream   Topical BID  . heparin injection (subcutaneous)  5,000 Units Subcutaneous Q8H  . hydrocortisone sod succinate (SOLU-CORTEF) inj  50 mg Intravenous Q12H  . insulin aspart  0-9 Units Subcutaneous TID WC  . insulin glargine  10 Units Subcutaneous QHS  . mouth rinse  15 mL Mouth  Rinse BID  . metolazone  5 mg Oral BID  . metoprolol tartrate  25 mg Oral BID  . nystatin   Topical BID  . potassium chloride  20 mEq Oral BID  . sodium chloride flush  10-40 mL Intracatheter Q12H  . vancomycin  125 mg Oral QID   Continuous Infusions: . furosemide       LOS: 8 days    Time spent: 25 minutes    Barb Merino, MD [facility] Pager [phone]  If 7PM-7AM, please contact night-coverage [email or web address] Password [email or web address] 08/01/2018, 10:10 AM

## 2018-08-01 NOTE — Progress Notes (Signed)
[facility] Progress Note  Subjective: 925 cc UOP yest, creat down 2.8 today  Vitals:   08/01/18 0422 08/01/18 0600 08/01/18 0915 08/01/18 1421  BP: 139/65  (!) 128/56 (!) 116/55  Pulse: 96  (!) 110 75  Resp: 16   14  Temp: 98.9 F (37.2 C)   97.7 F (36.5 C)  TempSrc:      SpO2: 94%  96% 93%  Weight:  73.9 kg    Height:        Inpatient medications: . Chlorhexidine Gluconate Cloth  6 each Topical Daily  . diltiazem  30 mg Oral Q8H  . Gerhardt's butt cream   Topical BID  . heparin injection (subcutaneous)  5,000 Units Subcutaneous Q8H  . insulin aspart  0-9 Units Subcutaneous TID WC  . insulin glargine  10 Units Subcutaneous QHS  . mouth rinse  15 mL Mouth Rinse BID  . metolazone  5 mg Oral BID  . metoprolol tartrate  25 mg Oral BID  . nystatin   Topical BID  . potassium chloride  20 mEq Oral BID  . [START ON 08/02/2018] predniSONE  40 mg Oral Q breakfast  . sodium chloride flush  10-40 mL Intracatheter Q12H  . vancomycin  125 mg Oral QID   . furosemide 160 mg (08/01/18 1239)   acetaminophen **OR** acetaminophen, albuterol, morphine injection, nitroGLYCERIN, ondansetron **OR** ondansetron (ZOFRAN) IV, polyvinyl alcohol, sodium chloride flush    Exam: Gen alert, lying flat, elderly calm No jvd or bruits Chest some rales R base, L clear RRR no MRG Abd soft ntnd no mass or ascites +bs Ext diffuse 2+ pitting LE edema, feet to hips Neuro is alert, Ox 3 , nf, gen'd weak    Home meds:  - aspirin 325/ atorvastatin 20  - prednisone 5/ etanercept '50mg'$  weekly  - esomeprazole 40 qd  - insulin NPH 20 hs  - metoprolol xl 25 qd/ diltiazem 60 hs  - prn's/ vitamins/ supplements   Date               Creat               eGFR  07/09/18             1.49                 37    CXR's - no acute disease  Renal US - no sig abnormalities, no hydro. 10-11 cm kidneys.   Assessment/ Plan: 1. AKI on CKD 3 - baseline creat 1.2- 1.5. Here for Cdif diarrhea, pt got  lots of IVF's and became sig vol overloaded, creat went up and UOP went down. Doing better w/ IV lasix, creat down 4 > 3.5  > 2.8 today.  UA negative and US wnl.  Cont diuretics, have increased to maximum dose as she still has sig edema. .  2. Vol overload - as above 3. Cdif infection/ colitis/ diarrhea - improving on po vanc 4. RA/ psoriasis 5. DM2 on insulin      [facility] 08/01/2018, 3:03 PM  Iron/TIBC/Ferritin/ %Sat No results found for: IRON, TIBC, FERRITIN, IRONPCTSAT Recent Labs  Lab 07/27/18 2000  07/29/18 0533  08/01/18 0532  NA 139   < > 135   < > 140  K 3.3*   < > 3.7   < > 4.1  CL 98   < > 92*   < > 100  CO2 25   < >  26   < > 27  GLUCOSE 101*   < > 321*   < > 172*  BUN 48*   < > 53*   < > 65*  CREATININE 3.45*   < > 3.99*   < > 2.88*  CALCIUM 5.8*   < > 6.1*   < > 7.3*  PHOS  --   --  5.8*  --   --   ALBUMIN 1.6*  --   --   --   --    < > = values in this interval not displayed.   Recent Labs  Lab 07/27/18 2000  AST 25  ALT 13  ALKPHOS 60  BILITOT 0.5  PROT 4.1*   Recent Labs  Lab 07/31/18 0516  WBC 11.8*  HGB 10.7*  HCT 33.1*  PLT 140*

## 2018-08-02 ENCOUNTER — Inpatient Hospital Stay (HOSPITAL_COMMUNITY): Payer: Medicare Other

## 2018-08-02 ENCOUNTER — Encounter (HOSPITAL_COMMUNITY): Payer: Self-pay | Admitting: Nephrology

## 2018-08-02 LAB — PROTIME-INR
INR: 1.1 (ref 0.8–1.2)
Prothrombin Time: 13.6 seconds (ref 11.4–15.2)

## 2018-08-02 LAB — GLUCOSE, CAPILLARY: Glucose-Capillary: 186 mg/dL — ABNORMAL HIGH (ref 70–99)

## 2018-08-02 LAB — BASIC METABOLIC PANEL
Anion gap: 15 (ref 5–15)
BUN: 70 mg/dL — ABNORMAL HIGH (ref 8–23)
CO2: 29 mmol/L (ref 22–32)
Calcium: 7.3 mg/dL — ABNORMAL LOW (ref 8.9–10.3)
Chloride: 97 mmol/L — ABNORMAL LOW (ref 98–111)
Creatinine, Ser: 2.77 mg/dL — ABNORMAL HIGH (ref 0.44–1.00)
GFR calc Af Amer: 18 mL/min — ABNORMAL LOW (ref >60)
GFR calc non Af Amer: 15 mL/min — ABNORMAL LOW (ref >60)
Glucose, Bld: 96 mg/dL (ref 70–99)
Potassium: 3.6 mmol/L (ref 3.5–5.1)
Sodium: 141 mmol/L (ref 135–145)

## 2018-08-02 MED ORDER — FUROSEMIDE 10 MG/ML IJ SOLN
120.0000 mg | Freq: Three times a day (TID) | INTRAVENOUS | Status: DC
  Administered 2018-08-02 – 2018-08-04 (×6): 120 mg via INTRAVENOUS
  Filled 2018-08-02: qty 12
  Filled 2018-08-02 (×2): qty 10
  Filled 2018-08-02 (×4): qty 12

## 2018-08-02 MED ORDER — DEXTROSE 50 % IV SOLN
INTRAVENOUS | Status: AC
  Administered 2018-08-02: 50 mL
  Filled 2018-08-02: qty 50

## 2018-08-02 MED ORDER — METOLAZONE 2.5 MG PO TABS
2.5000 mg | ORAL_TABLET | Freq: Two times a day (BID) | ORAL | Status: DC
  Administered 2018-08-02 – 2018-08-04 (×3): 2.5 mg via ORAL
  Filled 2018-08-02 (×5): qty 1

## 2018-08-02 NOTE — Plan of Care (Signed)
  Problem: Education: Goal: Knowledge of General Education information will improve Description Including pain rating scale, medication(s)/side effects and non-pharmacologic comfort measures Outcome: Progressing   Problem: Health Behavior/Discharge Planning: Goal: Ability to manage health-related needs will improve Outcome: Progressing   Problem: Clinical Measurements: Goal: Ability to maintain clinical measurements within normal limits will improve Outcome: Progressing Goal: Will remain free from infection Outcome: Progressing Goal: Diagnostic test results will improve Outcome: Progressing Goal: Cardiovascular complication will be avoided Outcome: Progressing   Problem: Activity: Goal: Risk for activity intolerance will decrease Outcome: Progressing   Problem: Nutrition: Goal: Adequate nutrition will be maintained Outcome: Progressing   Problem: Coping: Goal: Level of anxiety will decrease Outcome: Progressing   Problem: Elimination: Goal: Will not experience complications related to bowel motility Outcome: Progressing Goal: Will not experience complications related to urinary retention Outcome: Progressing   Problem: Safety: Goal: Ability to remain free from injury will improve Outcome: Progressing   Problem: Skin Integrity: Goal: Risk for impaired skin integrity will decrease Outcome: Progressing   Problem: Nutrition Goal: Patient maintains adequate hydration Outcome: Progressing Goal: Patient maintains weight Outcome: Progressing

## 2018-08-02 NOTE — Progress Notes (Signed)
Hypoglycemic Event  CBG: 56  Treatment: 4 oz juice/soda  Symptoms: None  Follow-up CBG: Time:1240 CBG Result:55  Possible Reasons for Event: Inadequate meal intake  Comments/MD notified: Dr. Sloan Leiter notified.    [name]

## 2018-08-02 NOTE — Progress Notes (Signed)
Patient suffers from significant weakness 2* c diff  which impairs their ability to perform daily activities like walking in the home.  A walker alone will not resolve the issues with performing activities of daily living. A wheelchair will allow patient to safely perform daily activities.  The patient can self propel in the home or has a caregiver who can provide assistance.     Blondell Reveal Kistler PT 08/02/2018  Acute Rehabilitation Services Pager [phone] Office [phone]

## 2018-08-02 NOTE — Progress Notes (Addendum)
This nurse was notified by Meagan, NT of patient's CBG of 56 at 1226. Pt was given 4oz of apple juice. Upon recheck, pt had only consumed half of the juice and CBG was 55. Pt encouraged to consume remainder of juice with this nurse and the nurse tech present, CBG was rechecked again and was 51. This nurse administered 1/2 amp of D50 and upon recheck the patient's blood sugar was stable at 141. MD aware of above events. Will continue to monitor.

## 2018-08-02 NOTE — Progress Notes (Signed)
  Hypoglycemic Event  CBG: 55  Treatment: D50 25 mL (12.5 gm)  Symptoms: Shaky  Follow-up CBG: Time:1302 CBG Result:141  Possible Reasons for Event: Inadequate meal intake  Comments/MD notified:Dr. Ghimire aware.    [name]

## 2018-08-02 NOTE — Progress Notes (Signed)
PROGRESS NOTE    Shelby Walker  DUK:[id number] DOB: May 16, 1933 DOA: 07/24/2018 PCP: Reynold Bowen, MD    Brief Narrative:   Patient is 85 year old female with history of type 2 diabetes on oral hypoglycemics, hypertension, hyperlipidemia, GERD and rheumatoid arthritis on chronic prednisone therapy who presented to the hospital on 07/24/2018 with nausea, vomiting, diarrhea ongoing for about 3 weeks.  Patient was seen at urgent care and diagnosed with infectious colitis and had completed 7 days course of ciprofloxacin and Flagyl without improvement of symptoms.  On arrival to the emergency room, patient was febrile, tachycardic.  COVID-19 test was negative.  CT scan abdomen pelvis showed diffuse colitis without perforation or abscess.  Patient was admitted to the general medical floor and started on treatment.  Patient started becoming hypotensive and tachycardic on the evening of 07/25/2018 and she was transferred to stepdown.  She developed acute kidney injury, leukocytosis worsened and blood pressure further dropped and was transferred to ICU on 07/26/2018.  Since then she has been diagnosed with C. difficile colitis, acute kidney injury.  5/21 Admitted 5/23 Transferred to ICU for hypotension requiring vasopressors; DNR/DNI 5/24 off pressors; a fib with RVR, started amiodarone 5/26 sinus rhythm Transferred out of ICU.  Patient remains anuric. 5/27 patient has 100-150 mL urine output for last few days.  14 L positive balance and diuresing. Diuresing with improvement.  Assessment & Plan:   Principal Problem:   Clostridium difficile colitis Active Problems:   Rheumatoid arthritis ([place])   Diabetes mellitus ([place])   GERD (gastroesophageal reflux disease)   Diverticulitis   Colitis presumed infectious   Colitis   Shock ([place])  Shock: Septic shock from C. difficile colitis and sepsis present on admission. Currently adequately improved.  Hemodynamically improving.  Off all antibiotics and remains  on oral vancomycin for day 5/14 days. Was on stress dose of IV steroids, now on oral steroids.  Will gradually taper to her home doses.  Blood cultures were negative.  C. difficile colitis: Clinically improving.  Will allow regular diet and see response.  Has occasional loose stool.  Acute kidney injury with oliguric renal failure on chronic kidney disease stage III: Patient had significant positive balance and was treated by IV Lasix with improvement. Seen by nephrology.  Started on high-dose Lasix challenge with improvement of urine output and renal functions. Continues to improve. Adequate urine output now.  Hypocalcemia: Improved  Hypokalemia: We will continue oral replacement.  Normalized.  Type 2 diabetes: Exacerbated by high steroid use and IV dextrose use.  Currently on Lantus and sliding scale insulin.  Blood sugars are fair today.  Hypertension: Home blood pressure medications on hold.  Resume metoprolol and Cardizem.  GERD: On PPI continue.  Rheumatoid arthritis: On Enbrel and prednisone at home.  Enbrel on hold.  On prednisone.  Physical debility and deconditioning: Continue to work with PT OT now.  Recommended SNF but patient wants to go home.  A. fib with RVR in the setting of sepsis: Patient known paroxysmal A. fib.  Treated with amiodarone in ICU.   Recurrent A. fib with RVR treated with oral metoprolol and Cardizem.  Currently sinus rhythm. Once patient is adequately improved, will discuss about oral anticoagulation.  DVT prophylaxis: Heparin subcu Code Status: DNR/DNI Family Communication: Will update daughter over the phone 5/29. Disposition Plan: Inpatient.   Consultants:   PCCM, signed off,  Nephrology   Procedures:   None  Antimicrobials:  Flagyl 5/21 >> 5/23 Zosyn 5/23 >> 5/25 Oral vancomycin  5/22 >> 5/24 Vancomycin enema 5/24 >> 5/26 Oral vancomycin 5/26 >>    Subjective: Patient seen and examined.  No overnight events.  Currently remains  sinus rhythm.  Has poor appetite however denies any nausea vomiting or dysphagia.   Objective: Vitals:   08/01/18 1827 08/01/18 2143 08/02/18 0452 08/02/18 0501  BP: 130/61 (!) 132/54 125/63   Pulse: 80 75 70   Resp:   18   Temp: 98.6 F (37 C) 98.9 F (37.2 C) 98.2 F (36.8 C)   TempSrc:   Oral   SpO2: 97% 93% 93%   Weight:    72.7 kg  Height:        Intake/Output Summary (Last 24 hours) at 08/02/2018 1110 Last data filed at 08/02/2018 1607 Gross per 24 hour  Intake 410 ml  Output 1625 ml  Net -1215 ml   Filed Weights   07/31/18 0500 08/01/18 0600 08/02/18 0501  Weight: 70.6 kg 73.9 kg 72.7 kg    Examination:  Physical Exam  Constitutional: She is oriented to person, place, and time. She appears well-developed and well-nourished. No distress.  HENT:  Head: Normocephalic and atraumatic.  Mouth/Throat: No oropharyngeal exudate.  Eyes: Pupils are equal, round, and reactive to light. Conjunctivae are normal.  Neck: Normal range of motion. Neck supple.  Cardiovascular: Normal heart sounds.  Irregularly irregular.  Pulmonary/Chest: Effort normal and breath sounds normal. No respiratory distress.  Abdominal: Soft. Bowel sounds are normal. She exhibits no distension. There is no abdominal tenderness.  Musculoskeletal: Normal range of motion.        General: Edema present.     Comments: Pigmentation and 1+ pedal edema.  Lymphadenopathy:    She has no cervical adenopathy.  Neurological: She is alert and oriented to person, place, and time.  Skin: Skin is warm and dry.  Psychiatric: She has a normal mood and affect. Judgment normal.      Data Reviewed: I have personally reviewed following labs and imaging studies  CBC: Recent Labs  Lab 07/27/18 0330 07/28/18 0440 07/29/18 0533 07/31/18 0516  WBC 16.5* 19.7* 22.2* 11.8*  NEUTROABS  --   --   --  9.3*  HGB 11.1* 10.7* 10.7* 10.7*  HCT 33.3* 32.0* 33.4* 33.1*  MCV 88.1 86.7 88.8 90.4  PLT 246 216 173 140*    Basic Metabolic Panel: Recent Labs  Lab 07/29/18 0533 07/30/18 0526 07/31/18 0516 08/01/18 0532 08/02/18 0558  NA 135 136 139 140 141  K 3.7 3.4* 3.1* 4.1 3.6  CL 92* 95* 99 100 97*  CO2 26 29 26 27 29   GLUCOSE 321* 222* 100* 172* 96  BUN 53* 65* 57* 65* 70*  CREATININE 3.99* 3.98* 3.25* 2.88* 2.77*  CALCIUM 6.1* 6.5* 6.4* 7.3* 7.3*  MG 1.9  --  1.9  --   --   PHOS 5.8*  --   --   --   --    GFR: Estimated Creatinine Clearance: 14.2 mL/min (A) (by C-G formula based on SCr of 2.77 mg/dL (H)). Liver Function Tests: Recent Labs  Lab 07/26/18 1500 07/27/18 0330 07/27/18 1040 07/27/18 2000  AST 15 19 22 25   ALT 11 11 12 13   ALKPHOS 72 49 55 60  BILITOT <0.1* 0.2* 0.3 0.5  PROT 3.8* 4.0* 4.1* 4.1*  ALBUMIN 1.5* 1.5* 1.6* 1.6*   No results for input(s): LIPASE, AMYLASE in the last 168 hours. No results for input(s): AMMONIA in the last 168 hours. Coagulation Profile: Recent Labs  Lab  08/02/18 0918  INR 1.1   Cardiac Enzymes: Recent Labs  Lab 07/29/18 0533 07/29/18 1020 07/29/18 1725  TROPONINI 0.03* <0.03 0.03*   BNP (last 3 results) No results for input(s): PROBNP in the last 8760 hours. HbA1C: No results for input(s): HGBA1C in the last 72 hours. CBG: Recent Labs  Lab 07/31/18 1752 07/31/18 2135 08/01/18 0823 08/01/18 1236 08/01/18 1610  GLUCAP 95 135* 154* 141* 156*   Lipid Profile: No results for input(s): CHOL, HDL, LDLCALC, TRIG, CHOLHDL, LDLDIRECT in the last 72 hours. Thyroid Function Tests: No results for input(s): TSH, T4TOTAL, FREET4, T3FREE, THYROIDAB in the last 72 hours. Anemia Panel: No results for input(s): VITAMINB12, FOLATE, FERRITIN, TIBC, IRON, RETICCTPCT in the last 72 hours. Sepsis Labs: Recent Labs  Lab 07/26/18 1128  LATICACIDVEN 1.7    Recent Results (from the past 240 hour(s))  Gastrointestinal Panel by PCR , Stool     Status: None   Collection Time: 07/24/18  4:40 PM  Result Value Ref Range Status   Campylobacter  species NOT DETECTED NOT DETECTED Final   Plesimonas shigelloides NOT DETECTED NOT DETECTED Final   Salmonella species NOT DETECTED NOT DETECTED Final   Yersinia enterocolitica NOT DETECTED NOT DETECTED Final   Vibrio species NOT DETECTED NOT DETECTED Final   Vibrio cholerae NOT DETECTED NOT DETECTED Final   Enteroaggregative E coli (EAEC) NOT DETECTED NOT DETECTED Final   Enteropathogenic E coli (EPEC) NOT DETECTED NOT DETECTED Final   Enterotoxigenic E coli (ETEC) NOT DETECTED NOT DETECTED Final   Shiga like toxin producing E coli (STEC) NOT DETECTED NOT DETECTED Final   Shigella/Enteroinvasive E coli (EIEC) NOT DETECTED NOT DETECTED Final   Cryptosporidium NOT DETECTED NOT DETECTED Final   Cyclospora cayetanensis NOT DETECTED NOT DETECTED Final   Entamoeba histolytica NOT DETECTED NOT DETECTED Final   Giardia lamblia NOT DETECTED NOT DETECTED Final   Adenovirus F40/41 NOT DETECTED NOT DETECTED Final   Astrovirus NOT DETECTED NOT DETECTED Final   Norovirus GI/GII NOT DETECTED NOT DETECTED Final   Rotavirus A NOT DETECTED NOT DETECTED Final   Sapovirus (I, II, IV, and V) NOT DETECTED NOT DETECTED Final    Comment: Performed at [facility], [address] [place], [place] [address]  SARS Coronavirus 2 (Hosp order,Performed in [facility] lab via [employer] ID)     Status: None   Collection Time: 07/24/18  4:40 PM  Result Value Ref Range Status   SARS Coronavirus 2 ([employer] ID Now) NEGATIVE NEGATIVE Final    Comment: (NOTE) Interpretive Result Comment(s): COVID 19 Positive SARS CoV 2 target nucleic acids are DETECTED. The SARS CoV 2 RNA is generally detectable in upper and lower respiratory specimens during the acute phase of infection.  Positive results are indicative of active infection with SARS CoV 2.  Clinical correlation with patient history and other diagnostic information is necessary to determine patient infection status.  Positive results do not rule out bacterial  infection or coinfection with other viruses. The expected result is Negative. COVID 19 Negative SARS CoV 2 target nucleic acids are NOT DETECTED. The SARS CoV 2 RNA is generally detectable in upper and lower respiratory specimens during the acute phase of infection.  Negative results do not preclude SARS CoV 2 infection, do not rule out coinfections with other pathogens, and should not be used as the sole basis for treatment or other patient management decisions.  Negative results must be combined with clinical  observations, patient history, and epidemiological information. The  expected result is Negative. Invalid Presence or absence of SARS CoV 2 nucleic acids cannot be determined. Repeat testing was performed on the submitted specimen and repeated Invalid results were obtained.  If clinically indicated, additional testing on a new specimen with an alternate test methodology [phone]) is advised.  The SARS CoV 2 RNA is generally detectable in upper and lower respiratory specimens during the acute phase of infection. The expected result is Negative. Fact Sheet for Patients:  GolfingFamily.no Fact Sheet for Healthcare Providers: https://www.hernandez-brewer.com/ This test is not yet approved or cleared by the [place] FDA and has been authorized for detection and/or diagnosis of SARS CoV 2 by FDA under an Emergency Use Authorization (EUA).  This EUA will remain in effect (meaning this test can be used) for the duration of the COVID19 d eclaration under Section 564(b)(1) of the Act, 21 U.S.C. section [phone] 3(b)(1), unless the authorization is terminated or revoked sooner. Performed at [facility], [address] [place], [place] [address]   C difficile quick scan w PCR reflex     Status: Abnormal   Collection Time: 07/24/18  6:50 PM  Result Value Ref Range Status   C Diff antigen POSITIVE (A) NEGATIVE Final   C Diff toxin  POSITIVE (A) NEGATIVE Final   C Diff interpretation Toxin producing C. difficile detected.  Final    Comment: CRITICAL RESULT CALLED TO, READ BACK BY AND VERIFIED WITH: [facility] RN 07/24/18 2224 JDW Performed at [facility], [place] [address] [place], [place] [address]   MRSA PCR Screening     Status: None   Collection Time: 07/25/18  6:07 PM  Result Value Ref Range Status   MRSA by PCR NEGATIVE NEGATIVE Final    Comment:        The GeneXpert MRSA Assay (FDA approved for NASAL specimens only), is one component of a comprehensive MRSA colonization surveillance program. It is not intended to diagnose MRSA infection nor to guide or monitor treatment for MRSA infections. Performed at [facility], [place] [address] [place], [place] [address]   Culture, blood (routine x 2)     Status: None   Collection Time: 07/26/18  8:57 AM  Result Value Ref Range Status   Specimen Description   Final    RIGHT ANTECUBITAL Performed at [facility] [address], [place], [place] [address]    Special Requests   Final    BOTTLES DRAWN AEROBIC ONLY Blood Culture adequate volume Performed at [facility] [address], [place], [place] [address]    Culture   Final    NO GROWTH 5 DAYS Performed at [facility], [place] [address] [place], [place] [address]    Report Status 07/31/2018 FINAL  Final  Culture, blood (routine x 2)     Status: None   Collection Time: 07/26/18  8:57 AM  Result Value Ref Range Status   Specimen Description   Final    BLOOD LEFT HAND Performed at [facility] [address] [place], [place] [address]    Special Requests   Final    BOTTLES DRAWN AEROBIC ONLY Blood Culture results may not be optimal due to an inadequate volume of blood received in culture bottles Performed at [facility] [address] [place],  [address]    Culture   Final    NO GROWTH  5 DAYS Performed at [facility],  [address], [place], [place] [address]    Report Status 07/31/2018 FINAL  Final         Radiology Studies: No results found.      Scheduled Meds: . diltiazem  30 mg Oral Q8H  . Gerhardt's butt cream   Topical BID  . heparin injection (subcutaneous)  5,000 Units Subcutaneous Q8H  . insulin aspart  0-9 Units Subcutaneous TID WC  . insulin glargine  10 Units Subcutaneous QHS  . mouth rinse  15 mL Mouth Rinse BID  . metolazone  2.5 mg Oral BID  . metoprolol tartrate  25 mg Oral BID  . nystatin   Topical BID  . predniSONE  40 mg Oral Q breakfast  . sodium chloride flush  10-40 mL Intracatheter Q12H  . vancomycin  125 mg Oral QID   Continuous Infusions: . furosemide       LOS: 9 days    Time spent: 25 minutes    Barb Merino, MD [facility] Pager [phone]  If 7PM-7AM, please contact night-coverage [email or web address] Password [email or web address] 08/02/2018, 11:10 AM

## 2018-08-02 NOTE — Progress Notes (Signed)
[facility] Progress Note  Subjective: - 2.1 L UOP yestserday, -1.6 L NET. Creat down slihglty to 2.7 today  Vitals:   08/01/18 2143 08/02/18 0452 08/02/18 0501 08/02/18 1236  BP: (!) 132/54 125/63  (!) 129/55  Pulse: 75 70  62  Resp:  18  16  Temp: 98.9 F (37.2 C) 98.2 F (36.8 C)  (!) 97.5 F (36.4 C)  TempSrc:  Oral  Oral  SpO2: 93% 93%  97%  Weight:   72.7 kg   Height:        Inpatient medications: . diltiazem  30 mg Oral Q8H  . Gerhardt's butt cream   Topical BID  . heparin injection (subcutaneous)  5,000 Units Subcutaneous Q8H  . insulin aspart  0-9 Units Subcutaneous TID WC  . mouth rinse  15 mL Mouth Rinse BID  . metolazone  2.5 mg Oral BID  . metoprolol tartrate  25 mg Oral BID  . nystatin   Topical BID  . predniSONE  40 mg Oral Q breakfast  . sodium chloride flush  10-40 mL Intracatheter Q12H  . vancomycin  125 mg Oral QID   . furosemide 120 mg (08/02/18 1422)   acetaminophen **OR** acetaminophen, albuterol, morphine injection, nitroGLYCERIN, ondansetron **OR** ondansetron (ZOFRAN) IV, polyvinyl alcohol, sodium chloride flush    Exam: Gen alert, lying flat, elderly calm No jvd or bruits Chest some rales R base, L clear RRR no MRG Abd soft ntnd no mass or ascites +bs Ext diffuse 2+ pitting LE edema, feet to hips Neuro is alert, Ox 3 , nf, gen'd weak    Home meds:  - aspirin 325/ atorvastatin 20  - prednisone 5/ etanercept '50mg'$  weekly  - esomeprazole 40 qd  - insulin NPH 20 hs  - metoprolol xl 25 qd/ diltiazem 60 hs  - prn's/ vitamins/ supplements   Date               Creat               eGFR  07/09/18             1.49                 37    CXR's - no acute disease  Renal US - no sig abnormalities, no hydro. 10-11 cm kidneys.   Assessment/ Plan: 1. AKI on CKD 3 - baseline creat 1.2- 1.5. Here for Cdif diarrhea, pt got lots of IVF's and became sig vol overloaded w/ acute on chronic renal failure.  Doing better w/ IV lasix,  creat down 4 > 3.5  > 2.8 > 2.7  UA negative and US wnl.  Cont diuretics, edema is improving.  2. Vol overload - as above 3. Cdif infection/ colitis/ diarrhea - improving on po vanc 4. RA/ psoriasis 5. DM2 on insulin      [facility] 08/02/2018, 5:56 PM  Iron/TIBC/Ferritin/ %Sat No results found for: IRON, TIBC, FERRITIN, IRONPCTSAT Recent Labs  Lab 07/27/18 2000  07/29/18 0533  08/02/18 0558 08/02/18 0918  NA 139   < > 135   < > 141  --   K 3.3*   < > 3.7   < > 3.6  --   CL 98   < > 92*   < > 97*  --   CO2 25   < > 26   < > 29  --   GLUCOSE 101*   < > 321*   < >  96  --   BUN 48*   < > 53*   < > 70*  --   CREATININE 3.45*   < > 3.99*   < > 2.77*  --   CALCIUM 5.8*   < > 6.1*   < > 7.3*  --   PHOS  --   --  5.8*  --   --   --   ALBUMIN 1.6*  --   --   --   --   --   INR  --   --   --   --   --  1.1   < > = values in this interval not displayed.   Recent Labs  Lab 07/27/18 2000  AST 25  ALT 13  ALKPHOS 60  BILITOT 0.5  PROT 4.1*   Recent Labs  Lab 07/31/18 0516  WBC 11.8*  HGB 10.7*  HCT 33.1*  PLT 140*

## 2018-08-03 ENCOUNTER — Inpatient Hospital Stay (HOSPITAL_COMMUNITY): Payer: Medicare Other

## 2018-08-03 LAB — CBC WITH DIFFERENTIAL/PLATELET
Abs Immature Granulocytes: 0.56 10*3/uL — ABNORMAL HIGH (ref 0.00–0.07)
Basophils Absolute: 0.1 10*3/uL (ref 0.0–0.1)
Basophils Relative: 1 %
Eosinophils Absolute: 0 10*3/uL (ref 0.0–0.5)
Eosinophils Relative: 0 %
HCT: 35.8 % — ABNORMAL LOW (ref 36.0–46.0)
Hemoglobin: 11.3 g/dL — ABNORMAL LOW (ref 12.0–15.0)
Immature Granulocytes: 5 %
Lymphocytes Relative: 10 %
Lymphs Abs: 1.3 10*3/uL (ref 0.7–4.0)
MCH: 29.2 pg (ref 26.0–34.0)
MCHC: 31.6 g/dL (ref 30.0–36.0)
MCV: 92.5 fL (ref 80.0–100.0)
Monocytes Absolute: 0.6 10*3/uL (ref 0.1–1.0)
Monocytes Relative: 5 %
Neutro Abs: 9.6 10*3/uL — ABNORMAL HIGH (ref 1.7–7.7)
Neutrophils Relative %: 79 %
Platelets: 184 10*3/uL (ref 150–400)
RBC: 3.87 MIL/uL (ref 3.87–5.11)
RDW: 15.1 % (ref 11.5–15.5)
WBC: 12 10*3/uL — ABNORMAL HIGH (ref 4.0–10.5)
nRBC: 0.2 % (ref 0.0–0.2)

## 2018-08-03 LAB — BASIC METABOLIC PANEL
Anion gap: 15 (ref 5–15)
BUN: 70 mg/dL — ABNORMAL HIGH (ref 8–23)
CO2: 31 mmol/L (ref 22–32)
Calcium: 7.3 mg/dL — ABNORMAL LOW (ref 8.9–10.3)
Chloride: 95 mmol/L — ABNORMAL LOW (ref 98–111)
Creatinine, Ser: 2.58 mg/dL — ABNORMAL HIGH (ref 0.44–1.00)
GFR calc Af Amer: 19 mL/min — ABNORMAL LOW (ref >60)
GFR calc non Af Amer: 17 mL/min — ABNORMAL LOW (ref >60)
Glucose, Bld: 172 mg/dL — ABNORMAL HIGH (ref 70–99)
Potassium: 3.6 mmol/L (ref 3.5–5.1)
Sodium: 141 mmol/L (ref 135–145)

## 2018-08-03 LAB — GLUCOSE, CAPILLARY
Glucose-Capillary: 142 mg/dL — ABNORMAL HIGH (ref 70–99)
Glucose-Capillary: 56 mg/dL — ABNORMAL LOW (ref 70–99)
Glucose-Capillary: 141 mg/dL — ABNORMAL HIGH (ref 70–99)
Glucose-Capillary: 51 mg/dL — ABNORMAL LOW (ref 70–99)
Glucose-Capillary: 55 mg/dL — ABNORMAL LOW (ref 70–99)
Glucose-Capillary: 156 mg/dL — ABNORMAL HIGH (ref 70–99)
Glucose-Capillary: 167 mg/dL — ABNORMAL HIGH (ref 70–99)
Glucose-Capillary: 231 mg/dL — ABNORMAL HIGH (ref 70–99)
Glucose-Capillary: 186 mg/dL — ABNORMAL HIGH (ref 70–99)

## 2018-08-03 MED ORDER — OXYCODONE HCL 5 MG PO TABS
5.0000 mg | ORAL_TABLET | Freq: Four times a day (QID) | ORAL | Status: DC | PRN
  Administered 2018-08-03 – 2018-08-06 (×5): 5 mg via ORAL
  Filled 2018-08-03 (×6): qty 1

## 2018-08-03 MED ORDER — METOPROLOL TARTRATE 25 MG PO TABS
12.5000 mg | ORAL_TABLET | Freq: Two times a day (BID) | ORAL | Status: DC
  Administered 2018-08-03 – 2018-08-06 (×6): 12.5 mg via ORAL
  Filled 2018-08-03 (×6): qty 1

## 2018-08-03 NOTE — Progress Notes (Signed)
PROGRESS NOTE    Shelby Walker  OIB:[id number] DOB: [birth year] DOA: 07/24/2018 PCP: Reynold Bowen, MD    Brief Narrative:   Patient is 85 year old female with history of type 2 diabetes on oral hypoglycemics, hypertension, hyperlipidemia, GERD and rheumatoid arthritis on chronic prednisone therapy who presented to the hospital on 07/24/2018 with nausea, vomiting, diarrhea ongoing for about 3 weeks.  Patient was seen at urgent care and diagnosed with infectious colitis and had completed 7 days course of ciprofloxacin and Flagyl without improvement of symptoms.  On arrival to the emergency room, patient was febrile, tachycardic.  COVID-19 test was negative.  CT scan abdomen pelvis showed diffuse colitis without perforation or abscess.  Patient was admitted to the general medical floor and started on treatment.  Patient started becoming hypotensive and tachycardic on the evening of 07/25/2018 and she was transferred to stepdown.  She developed acute kidney injury, leukocytosis worsened and blood pressure further dropped and was transferred to ICU on 07/26/2018.  Since then she has been diagnosed with C. difficile colitis, acute kidney injury.  5/21 Admitted 5/23 Transferred to ICU for hypotension requiring vasopressors; DNR/DNI 5/24 off pressors; a fib with RVR, started amiodarone 5/26 sinus rhythm Transferred out of ICU.  Patient remains anuric. 5/27 patient has 100-150 mL urine output for last few days.  14 L positive balance and diuresing. Diuresing with improvement.  Assessment & Plan:   Principal Problem:   Clostridium difficile colitis Active Problems:   Rheumatoid arthritis ([place])   Diabetes mellitus ([place])   GERD (gastroesophageal reflux disease)   Diverticulitis   Colitis presumed infectious   Colitis   Shock ([place])  Shock: Septic shock from C. difficile colitis and sepsis present on admission. Currently adequately improved.  Hemodynamically improving.  Off all antibiotics and remains  on oral vancomycin for day 6/14 days. Was on stress dose of IV steroids, now on oral steroids.  Will gradually taper to her home doses.  Blood cultures were negative.  C. difficile colitis: Clinically improving.  Will allow regular diet and see response.  Has occasional loose stool.  Acute kidney injury with oliguric renal failure on chronic kidney disease stage III: Patient had significant positive balance and was treated by IV Lasix with improvement. Seen by nephrology.  Started on high-dose Lasix challenge with improvement of urine output and renal functions. Continues to improve. Adequate urine output now.  Hypocalcemia: Improved  Hypokalemia: We will continue oral replacement.  Normalized.  Type 2 diabetes: Exacerbated by high steroid use and IV dextrose use.  Currently on Lantus and sliding scale insulin.  Blood sugars are fair today. Discontinued long acting insulin.  Hypertension: Home blood pressure medications on hold.  Resume metoprolol and Cardizem.  GERD: On PPI continue.  Rheumatoid arthritis: On Enbrel and prednisone at home.  Enbrel on hold.  On prednisone.  Physical debility and deconditioning: Continue to work with PT OT now.  Recommended SNF but patient wants to go home.  A. fib with RVR in the setting of sepsis: Patient known paroxysmal A. fib.  Treated with amiodarone in ICU.   Recurrent A. fib with RVR treated with oral metoprolol and Cardizem.  Currently sinus rhythm. Once patient is adequately improved, will discuss about oral anticoagulation.  DVT prophylaxis: Heparin subcu Code Status: DNR/DNI Family Communication: updated daughter over the phone. Disposition Plan: Inpatient. Family does not want SNF, will send referral to acute rehab.  Consultants:   PCCM, signed off,  Nephrology   Procedures:   None  Antimicrobials:  Flagyl 5/21 >> 5/23 Zosyn 5/23 >> 5/25 Oral vancomycin 5/22 >> 5/24 Vancomycin enema 5/24 >> 5/26 Oral vancomycin 5/26 >>     Subjective: Patient seen and examined.  No overnight events.  Currently remains sinus rhythm.  Feels weak. No other overnight events.    Objective: Vitals:   08/02/18 1236 08/02/18 2138 08/03/18 0512 08/03/18 1207  BP: (!) 129/55 (!) 108/47 (!) 122/47 (!) 94/44  Pulse: 62 64 69 67  Resp: 16 18 16  (!) 8  Temp: (!) 97.5 F (36.4 C) 97.8 F (36.6 C) 98.2 F (36.8 C) 97.6 F (36.4 C)  TempSrc: Oral Oral Oral Oral  SpO2: 97% 93% 95% (!) 89%  Weight:   69.7 kg   Height:        Intake/Output Summary (Last 24 hours) at 08/03/2018 1259 Last data filed at 08/03/2018 8546 Gross per 24 hour  Intake 264 ml  Output 2775 ml  Net -2511 ml   Filed Weights   08/01/18 0600 08/02/18 0501 08/03/18 0512  Weight: 73.9 kg 72.7 kg 69.7 kg    Examination:  Physical Exam  Constitutional: She is oriented to person, place, and time. She appears well-developed and well-nourished. No distress.  HENT:  Head: Normocephalic and atraumatic.  Mouth/Throat: No oropharyngeal exudate.  Eyes: Pupils are equal, round, and reactive to light. Conjunctivae are normal.  Neck: Normal range of motion. Neck supple.  Cardiovascular: Normal heart sounds.  Irregularly irregular.  Pulmonary/Chest: Effort normal and breath sounds normal. No respiratory distress.  Abdominal: Soft. Bowel sounds are normal. She exhibits no distension. There is no abdominal tenderness.  Musculoskeletal: Normal range of motion.        General: Edema present.     Comments: Pigmentation and 1+ pedal edema.  Lymphadenopathy:    She has no cervical adenopathy.  Neurological: She is alert and oriented to person, place, and time.  Skin: Skin is warm and dry.  Psychiatric: She has a normal mood and affect. Judgment normal.      Data Reviewed: I have personally reviewed following labs and imaging studies  CBC: Recent Labs  Lab 07/28/18 0440 07/29/18 0533 07/31/18 0516 08/03/18 0417  WBC 19.7* 22.2* 11.8* 12.0*  NEUTROABS  --    --  9.3* 9.6*  HGB 10.7* 10.7* 10.7* 11.3*  HCT 32.0* 33.4* 33.1* 35.8*  MCV 86.7 88.8 90.4 92.5  PLT 216 173 140* 270   Basic Metabolic Panel: Recent Labs  Lab 07/29/18 0533 07/30/18 0526 07/31/18 0516 08/01/18 0532 08/02/18 0558 08/03/18 0417  NA 135 136 139 140 141 141  K 3.7 3.4* 3.1* 4.1 3.6 3.6  CL 92* 95* 99 100 97* 95*  CO2 26 29 26 27 29 31   GLUCOSE 321* 222* 100* 172* 96 172*  BUN 53* 65* 57* 65* 70* 70*  CREATININE 3.99* 3.98* 3.25* 2.88* 2.77* 2.58*  CALCIUM 6.1* 6.5* 6.4* 7.3* 7.3* 7.3*  MG 1.9  --  1.9  --   --   --   PHOS 5.8*  --   --   --   --   --    GFR: Estimated Creatinine Clearance: 14.9 mL/min (A) (by C-G formula based on SCr of 2.58 mg/dL (H)). Liver Function Tests: Recent Labs  Lab 07/27/18 2000  AST 25  ALT 13  ALKPHOS 60  BILITOT 0.5  PROT 4.1*  ALBUMIN 1.6*   No results for input(s): LIPASE, AMYLASE in the last 168 hours. No results for input(s): AMMONIA in the last  168 hours. Coagulation Profile: Recent Labs  Lab 08/02/18 0918  INR 1.1   Cardiac Enzymes: Recent Labs  Lab 07/29/18 0533 07/29/18 1020 07/29/18 1725  TROPONINI 0.03* <0.03 0.03*   BNP (last 3 results) No results for input(s): PROBNP in the last 8760 hours. HbA1C: No results for input(s): HGBA1C in the last 72 hours. CBG: Recent Labs  Lab 08/02/18 1241 08/02/18 1259 08/02/18 2134 08/03/18 0745 08/03/18 1200  GLUCAP 51* 141* 186* 156* 167*   Lipid Profile: No results for input(s): CHOL, HDL, LDLCALC, TRIG, CHOLHDL, LDLDIRECT in the last 72 hours. Thyroid Function Tests: No results for input(s): TSH, T4TOTAL, FREET4, T3FREE, THYROIDAB in the last 72 hours. Anemia Panel: No results for input(s): VITAMINB12, FOLATE, FERRITIN, TIBC, IRON, RETICCTPCT in the last 72 hours. Sepsis Labs: No results for input(s): PROCALCITON, LATICACIDVEN in the last 168 hours.  Recent Results (from the past 240 hour(s))  Gastrointestinal Panel by PCR , Stool     Status:  None   Collection Time: 07/24/18  4:40 PM  Result Value Ref Range Status   Campylobacter species NOT DETECTED NOT DETECTED Final   Plesimonas shigelloides NOT DETECTED NOT DETECTED Final   Salmonella species NOT DETECTED NOT DETECTED Final   Yersinia enterocolitica NOT DETECTED NOT DETECTED Final   Vibrio species NOT DETECTED NOT DETECTED Final   Vibrio cholerae NOT DETECTED NOT DETECTED Final   Enteroaggregative E coli (EAEC) NOT DETECTED NOT DETECTED Final   Enteropathogenic E coli (EPEC) NOT DETECTED NOT DETECTED Final   Enterotoxigenic E coli (ETEC) NOT DETECTED NOT DETECTED Final   Shiga like toxin producing E coli (STEC) NOT DETECTED NOT DETECTED Final   Shigella/Enteroinvasive E coli (EIEC) NOT DETECTED NOT DETECTED Final   Cryptosporidium NOT DETECTED NOT DETECTED Final   Cyclospora cayetanensis NOT DETECTED NOT DETECTED Final   Entamoeba histolytica NOT DETECTED NOT DETECTED Final   Giardia lamblia NOT DETECTED NOT DETECTED Final   Adenovirus F40/41 NOT DETECTED NOT DETECTED Final   Astrovirus NOT DETECTED NOT DETECTED Final   Norovirus GI/GII NOT DETECTED NOT DETECTED Final   Rotavirus A NOT DETECTED NOT DETECTED Final   Sapovirus (I, II, IV, and V) NOT DETECTED NOT DETECTED Final    Comment: Performed at [facility], [address] [place], [place] [address]  SARS Coronavirus 2 (Hosp order,Performed in [facility] lab via [employer] ID)     Status: None   Collection Time: 07/24/18  4:40 PM  Result Value Ref Range Status   SARS Coronavirus 2 ([employer] ID Now) NEGATIVE NEGATIVE Final    Comment: (NOTE) Interpretive Result Comment(s): COVID 19 Positive SARS CoV 2 target nucleic acids are DETECTED. The SARS CoV 2 RNA is generally detectable in upper and lower respiratory specimens during the acute phase of infection.  Positive results are indicative of active infection with SARS CoV 2.  Clinical correlation with patient history and other diagnostic information  is necessary to determine patient infection status.  Positive results do not rule out bacterial infection or coinfection with other viruses. The expected result is Negative. COVID 19 Negative SARS CoV 2 target nucleic acids are NOT DETECTED. The SARS CoV 2 RNA is generally detectable in upper and lower respiratory specimens during the acute phase of infection.  Negative results do not preclude SARS CoV 2 infection, do not rule out coinfections with other pathogens, and should not be used as the sole basis for treatment or other patient management decisions.  Negative results must be combined with clinical  observations, patient history, and epidemiological information. The expected result is Negative. Invalid Presence or absence of SARS CoV 2 nucleic acids cannot be determined. Repeat testing was performed on the submitted specimen and repeated Invalid results were obtained.  If clinically indicated, additional testing on a new specimen with an alternate test methodology [phone]) is advised.  The SARS CoV 2 RNA is generally detectable in upper and lower respiratory specimens during the acute phase of infection. The expected result is Negative. Fact Sheet for Patients:  GolfingFamily.no Fact Sheet for Healthcare Providers: https://www.hernandez-brewer.com/ This test is not yet approved or cleared by the [place] FDA and has been authorized for detection and/or diagnosis of SARS CoV 2 by FDA under an Emergency Use Authorization (EUA).  This EUA will remain in effect (meaning this test can be used) for the duration of the COVID19 d eclaration under Section 564(b)(1) of the Act, 21 U.S.C. section [phone] 3(b)(1), unless the authorization is terminated or revoked sooner. Performed at [facility], [address] [place], [place] [address]   C difficile quick scan w PCR reflex     Status: Abnormal   Collection Time: 07/24/18  6:50 PM   Result Value Ref Range Status   C Diff antigen POSITIVE (A) NEGATIVE Final   C Diff toxin POSITIVE (A) NEGATIVE Final   C Diff interpretation Toxin producing C. difficile detected.  Final    Comment: CRITICAL RESULT CALLED TO, READ BACK BY AND VERIFIED WITH: Harlen Labs RN 07/24/18 2224 JDW Performed at [facility], [place] [address], [place], [place] [address]   MRSA PCR Screening     Status: None   Collection Time: 07/25/18  6:07 PM  Result Value Ref Range Status   MRSA by PCR NEGATIVE NEGATIVE Final    Comment:        The GeneXpert MRSA Assay (FDA approved for NASAL specimens only), is one component of a comprehensive MRSA colonization surveillance program. It is not intended to diagnose MRSA infection nor to guide or monitor treatment for MRSA infections. Performed at [facility], [place] [address] [place], [place] [address]   Culture, blood (routine x 2)     Status: None   Collection Time: 07/26/18  8:57 AM  Result Value Ref Range Status   Specimen Description   Final    RIGHT ANTECUBITAL Performed at [place] [address] [place], [place] [address]    Special Requests   Final    BOTTLES DRAWN AEROBIC ONLY Blood Culture adequate volume Performed at [place] [address] [place], [place] [address]    Culture   Final    NO GROWTH 5 DAYS Performed at [facility], [place] [address] [place], [place] [address]    Report Status 07/31/2018 FINAL  Final  Culture, blood (routine x 2)     Status: None   Collection Time: 07/26/18  8:57 AM  Result Value Ref Range Status   Specimen Description   Final    BLOOD LEFT HAND Performed at [place] [address] [place], [place] [address]    Special Requests   Final    BOTTLES DRAWN AEROBIC ONLY Blood Culture results may not be optimal due to an inadequate volume of blood received in culture bottles Performed at [place] [address], [place],  [address]    Culture   Final    NO GROWTH 5  DAYS Performed at [facility], [place] [address], [place], [place] [address]    Report Status 07/31/2018 FINAL  Final         Radiology Studies: US Abdomen Limited Ruq  Result Date: 08/02/2018 CLINICAL DATA:  Anasarca EXAM: ULTRASOUND ABDOMEN LIMITED RIGHT UPPER QUADRANT COMPARISON:  CT 07/24/2018 FINDINGS: Gallbladder: Gallbladder is distended. Sludge seen within the gallbladder. There Is mild gallbladder wall thickening, 3-4 mm. No visible stones. Common bile duct: Diameter: Normal caliber, 6 mm Liver: No focal lesion identified. Within normal limits in parenchymal echogenicity. Portal vein is patent on color Doppler imaging with normal direction of blood flow towards the liver. Perihepatic ascites and ascites noted around the gallbladder. IMPRESSION: Distended gallbladder with sludge and mild gallbladder wall thickening. Consider further evaluation with nuclear medicine hepatobiliary scan if there is concern of cystic duct patency. Perihepatic ascites. Electronically Signed   By: Rolm Baptise M.D.   On: 08/02/2018 17:04        Scheduled Meds:  diltiazem  30 mg Oral Q8H   Gerhardt's butt cream   Topical BID   heparin injection (subcutaneous)  5,000 Units Subcutaneous Q8H   insulin aspart  0-9 Units Subcutaneous TID WC   mouth rinse  15 mL Mouth Rinse BID   metolazone  2.5 mg Oral BID   metoprolol tartrate  25 mg Oral BID   nystatin   Topical BID   predniSONE  40 mg Oral Q breakfast   sodium chloride flush  10-40 mL Intracatheter Q12H   vancomycin  125 mg Oral QID   Continuous Infusions:  furosemide 120 mg (08/03/18 0625)     LOS: 10 days    Time spent: 25 minutes    Barb Merino, MD [facility] Pager [phone]  If 7PM-7AM, please contact night-coverage [email or web address] Password [facility] 08/03/2018, 12:59 PM

## 2018-08-03 NOTE — Progress Notes (Signed)
   08/03/18 1207  MEWS Score  Resp (!) 8  Pulse Rate 67  BP (!) 94/44  Temp 97.6 F (36.4 C)  SpO2 (!) 89 %  O2 Device Room Air  MEWS Score  MEWS RR 1  MEWS Pulse 0  MEWS Systolic 1  MEWS LOC 0  MEWS Temp 0  MEWS Score 2  MEWS Score Color Yellow  MEWS Assessment  Is this an acute change? Yes  MEWS guidelines implemented *See Row Information* Yellow  Provider Notification  Provider Name/Title Dr. Sloan Leiter  Date Provider Notified 08/03/18  Time Provider Notified 1250  Notification Type Page  Notification Reason Change in status  Response Other (Comment) (will consult with nephrology)  Date of Provider Response 08/03/18  Time of Provider Response 1302

## 2018-08-03 NOTE — Progress Notes (Addendum)
[facility] Progress Note  Subjective: 2.7 L UOP yest, improving. Patient had a spell of hypotension and hypoxemia. Better now.   Vitals:   08/02/18 1236 08/02/18 2138 08/03/18 0512 08/03/18 1207  BP: (!) 129/55 (!) 108/47 (!) 122/47 (!) 94/44  Pulse: 62 64 69 67  Resp: 16 18 16 (!) 8  Temp: (!) 97.5 F (36.4 C) 97.8 F (36.6 C) 98.2 F (36.8 C) 97.6 F (36.4 C)  TempSrc: Oral Oral Oral Oral  SpO2: 97% 93% 95% (!) 89%  Weight:   69.7 kg   Height:        Inpatient medications: . diltiazem  30 mg Oral Q8H  . Gerhardt's butt cream   Topical BID  . heparin injection (subcutaneous)  5,000 Units Subcutaneous Q8H  . insulin aspart  0-9 Units Subcutaneous TID WC  . mouth rinse  15 mL Mouth Rinse BID  . metolazone  2.5 mg Oral BID  . metoprolol tartrate  25 mg Oral BID  . nystatin   Topical BID  . predniSONE  40 mg Oral Q breakfast  . sodium chloride flush  10-40 mL Intracatheter Q12H  . vancomycin  125 mg Oral QID   . furosemide 120 mg (08/03/18 0625)   acetaminophen **OR** acetaminophen, albuterol, morphine injection, nitroGLYCERIN, ondansetron **OR** ondansetron (ZOFRAN) IV, oxyCODONE, polyvinyl alcohol, sodium chloride flush    Exam: Gen alert, lying flat, elderly calm No jvd or bruits Chest some rales R base, L clear RRR no MRG Abd soft ntnd no mass or ascites +bs Ext diffuse 2+ pitting LE edema, feet to hips Neuro is alert, Ox 3 , nf, gen'd weak    Home meds:  - aspirin 325/ atorvastatin 20  - prednisone 5/ etanercept 50mg weekly  - esomeprazole 40 qd  - insulin NPH 20 hs  - metoprolol xl 25 qd/ diltiazem 60 hs  - prn's/ vitamins/ supplements   Date               Creat               eGFR  07/09/18             1.49                 37    CXR's - no acute disease  Renal US - no sig abnormalities, no hydro. 10-11 cm kidneys.  ECHO 5/23 - normal LVEF 60%  Assessment/ Plan: 1. AKI on CKD 3 - baseline creat 1.2- 1.5. Here for Cdif diarrhea, pt  got lots of IVF's and creat went to 4. With diuresis creat coming down 4 > 3.5  > 2.8 > 2.7 > 2.5.  UA negative and US wnl. Normal LVEF 60%.   Cont diuretics, pt was 16kg up, now is 12kg up, has a way to go for resolution of iatrogenic vol overload.  2. Afib - during sepsis, acute episode, has been in NSR for several days. Will dc po dilt and cut metoprolol po by 50% (d/c if BP's stay low) given need for high-dose diuretics.  3. Vol overload - as above 4. Cdif infection/ colitis/ diarrhea - improving on po vanc 5. RA/ psoriasis 6. DM2 on insulin      Rob  [facility] 08/03/2018, 2:27 PM  Iron/TIBC/Ferritin/ %Sat No results found for: IRON, TIBC, FERRITIN, IRONPCTSAT Recent Labs  Lab 07/27/18 2000  07/29/18 0533  08/02/18 0918 08/03/18 0417  NA 139   < > 135   < >  --    141  K 3.3*   < > 3.7   < >  --  3.6  CL 98   < > 92*   < >  --  95*  CO2 25   < > 26   < >  --  31  GLUCOSE 101*   < > 321*   < >  --  172*  BUN 48*   < > 53*   < >  --  70*  CREATININE 3.45*   < > 3.99*   < >  --  2.58*  CALCIUM 5.8*   < > 6.1*   < >  --  7.3*  PHOS  --   --  5.8*  --   --   --   ALBUMIN 1.6*  --   --   --   --   --   INR  --   --   --   --  1.1  --    < > = values in this interval not displayed.   Recent Labs  Lab 07/27/18 2000  AST 25  ALT 13  ALKPHOS 60  BILITOT 0.5  PROT 4.1*   Recent Labs  Lab 08/03/18 0417  WBC 12.0*  HGB 11.3*  HCT 35.8*  PLT 184

## 2018-08-04 LAB — BASIC METABOLIC PANEL
Anion gap: 14 (ref 5–15)
BUN: 73 mg/dL — ABNORMAL HIGH (ref 8–23)
CO2: 36 mmol/L — ABNORMAL HIGH (ref 22–32)
Calcium: 7.4 mg/dL — ABNORMAL LOW (ref 8.9–10.3)
Chloride: 89 mmol/L — ABNORMAL LOW (ref 98–111)
Creatinine, Ser: 2.52 mg/dL — ABNORMAL HIGH (ref 0.44–1.00)
GFR calc Af Amer: 20 mL/min — ABNORMAL LOW (ref >60)
GFR calc non Af Amer: 17 mL/min — ABNORMAL LOW (ref >60)
Glucose, Bld: 209 mg/dL — ABNORMAL HIGH (ref 70–99)
Potassium: 3.4 mmol/L — ABNORMAL LOW (ref 3.5–5.1)
Sodium: 139 mmol/L (ref 135–145)

## 2018-08-04 LAB — GLUCOSE, CAPILLARY
Glucose-Capillary: 177 mg/dL — ABNORMAL HIGH (ref 70–99)
Glucose-Capillary: 224 mg/dL — ABNORMAL HIGH (ref 70–99)
Glucose-Capillary: 285 mg/dL — ABNORMAL HIGH (ref 70–99)

## 2018-08-04 MED ORDER — POTASSIUM CHLORIDE CRYS ER 20 MEQ PO TBCR
20.0000 meq | EXTENDED_RELEASE_TABLET | Freq: Two times a day (BID) | ORAL | Status: AC
  Administered 2018-08-04 (×2): 20 meq via ORAL
  Filled 2018-08-04 (×2): qty 1

## 2018-08-04 MED ORDER — ENSURE ENLIVE PO LIQD
237.0000 mL | Freq: Two times a day (BID) | ORAL | Status: DC
  Administered 2018-08-04 – 2018-08-05 (×2): 237 mL via ORAL

## 2018-08-04 MED ORDER — METOLAZONE 2.5 MG PO TABS
2.5000 mg | ORAL_TABLET | Freq: Every day | ORAL | Status: DC
  Administered 2018-08-05: 2.5 mg via ORAL
  Filled 2018-08-04: qty 1

## 2018-08-04 MED ORDER — FUROSEMIDE 10 MG/ML IJ SOLN
80.0000 mg | Freq: Three times a day (TID) | INTRAMUSCULAR | Status: DC
  Administered 2018-08-04 – 2018-08-05 (×3): 80 mg via INTRAVENOUS
  Filled 2018-08-04 (×3): qty 8

## 2018-08-04 MED ORDER — ADULT MULTIVITAMIN W/MINERALS CH
1.0000 | ORAL_TABLET | Freq: Every day | ORAL | Status: DC
  Administered 2018-08-05 – 2018-08-06 (×2): 1 via ORAL
  Filled 2018-08-04 (×2): qty 1

## 2018-08-04 MED ORDER — BOOST / RESOURCE BREEZE PO LIQD CUSTOM
1.0000 | Freq: Two times a day (BID) | ORAL | Status: DC
  Administered 2018-08-05 – 2018-08-06 (×3): 1 via ORAL

## 2018-08-04 MED ORDER — VANCOMYCIN 50 MG/ML ORAL SOLUTION
125.0000 mg | Freq: Four times a day (QID) | ORAL | Status: DC
  Administered 2018-08-04 – 2018-08-06 (×9): 125 mg via ORAL
  Filled 2018-08-04 (×10): qty 2.5

## 2018-08-04 NOTE — Progress Notes (Signed)
Occupational Therapy Treatment Patient Details Name: Shelby Walker MRN: [id number] DOB: 08/02/1933 Today's Date: 08/04/2018    History of present illness 85 year old female who presented with nausea, vomiting and diarrhea x 3 weeks and admitted with sepsis secondary to C. Diff colitis.  PMHx of DM, HTN, GERD, RA   OT comments  Pt agreeable to OOB with OT.     Follow Up Recommendations  SNF;Supervision/Assistance - 24 hour    Equipment Recommendations  3 in 1 bedside commode    Recommendations for Other Services      Precautions / Restrictions Precautions Precautions: Fall Precaution Comments: HR and dizziness       Mobility Bed Mobility Overal bed mobility: Needs Assistance Bed Mobility: Supine to Sit;Rolling Rolling: Min assist     Sit to supine: Max assist;+2 for physical assistance      Transfers Overall transfer level: Needs assistance Equipment used: Rolling walker (2 wheeled)(2 person side by side) Transfers: Stand Pivot Transfers Sit to Stand: Mod assist Stand pivot transfers: Mod assist            Balance Overall balance assessment: Needs assistance Sitting-balance support: No upper extremity supported;Feet unsupported Sitting balance-Leahy Scale: Fair     Standing balance support: Bilateral upper extremity supported Standing balance-Leahy Scale: Zero Standing balance comment: requiring max A +2 to maintain balance                           ADL either performed or assessed with clinical judgement   ADL Overall ADL's : Needs assistance/impaired     Grooming: Wash/dry face;Oral care;Sitting       Lower Body Bathing: Sit to/from stand;Cueing for sequencing;Cueing for safety;Maximal assistance   Upper Body Dressing : Set up;Sitting   Lower Body Dressing: Maximal assistance;Sit to/from stand;Cueing for sequencing;Cueing for safety   Toilet Transfer: Moderate assistance;RW;Stand-pivot;Comfort height toilet   Toileting- Clothing  Manipulation and Hygiene: Maximal assistance;Sit to/from stand;Cueing for sequencing;Cueing for safety         General ADL Comments: Pt needed much encouragement.  Pt did get OOB, to BSc then to chair with OT     Vision Patient Visual Report: No change from baseline            Cognition Arousal/Alertness: Awake/alert Behavior During Therapy: WFL for tasks assessed/performed Overall Cognitive Status: Within Functional Limits for tasks assessed                                                     Pertinent Vitals/ Pain       Pain Score: 0-No pain     Prior Functioning/Environment              Frequency  Min 2X/week        Progress Toward Goals  OT Goals(current goals can now be found in the care plan section)  Progress towards OT goals: Progressing toward goals  Acute Rehab OT Goals Patient Stated Goal: to get stronger OT Goal Formulation: With patient Time For Goal Achievement: 08/18/18  Plan Discharge plan remains appropriate    Co-evaluation                 AM-PAC OT "6 Clicks" Daily Activity     Outcome Measure   Help from another person eating meals?: None  Help from another person taking care of personal grooming?: A Little Help from another person toileting, which includes using toliet, bedpan, or urinal?: A Lot Help from another person bathing (including washing, rinsing, drying)?: A Lot Help from another person to put on and taking off regular upper body clothing?: A Little Help from another person to put on and taking off regular lower body clothing?: A Lot 6 Click Score: 16    End of Session Equipment Utilized During Treatment: Gait belt  OT Visit Diagnosis: Other abnormalities of gait and mobility (R26.89);Unsteadiness on feet (R26.81);Muscle weakness (generalized) (M62.81);History of falling (Z91.81)   Activity Tolerance Patient tolerated treatment well   Patient Left in chair;with chair alarm set;with  nursing/sitter in room   Nurse Communication Mobility status        Time: 1206-1223 OT Time Calculation (min): 17 min  Charges: OT General Charges $OT Visit: 1 Visit OT Treatments $Self Care/Home Management : 8-22 mins  Kari Baars, [place] [phone] Office- [phone]      Baylon Santelli, Edwena Felty D 08/04/2018, 1:22 PM

## 2018-08-04 NOTE — Progress Notes (Signed)
Inpatient Rehabilitation-Admissions Coordinator   Spoke with pt and family via phone regarding CIR program. [facility] plans to meet with pt at the bedside tomorrow for rehab assessment and to discuss further details and determine pt interest in program. Family (daugther [name] and son Marlou Sa are in favor of CIR).   AC will follow up tomorrow after meeting with pt.   Jhonnie Garner, OTR/L  Rehab Admissions Coordinator  [phone] 08/04/2018 6:21 PM

## 2018-08-04 NOTE — Progress Notes (Signed)
Patient up to [facility] this morning.  Able to urinate 400 cc independently, bladder scan afterwards shows 113 cc, patient states the pressure and urge to urinate is now better and she is more comfortable.  Also had formed, but mushy BM.  Will continue to monitor.

## 2018-08-04 NOTE — Progress Notes (Signed)
Patient had one episode of vomiting that happened suddenly.  Was not feeling nauseated beforehand. Has had poor appetite all day but did drink a chocolate ensure and eat applesauce.  PRN zofran given with relief, will continue to monitor.

## 2018-08-04 NOTE — Progress Notes (Signed)
Patient ID: Shelby Walker, female   DOB: [birth year], 85 y.o.   MRN: [id number] [facility] Progress Note   Assessment/ Plan:   1. Acute kidney Injury on chronic kidney disease stage III: Responding well to diuresis with net negative fluid balance and creatinine essentially unchanged overnight with mild rise of BUN.  Will decrease diuretic dose at this time based on 2.  Volume overload: Responding well to diuresis-based on labs/symptomatic assessment, will start decreasing dose and monitoring input/output. 3.  C. difficile infection/colitis: Continues to improve well on oral vancomycin 4.  Hypokalemia/metabolic alkalosis: Secondary to GI losses and now diuresis/contraction alkalosis.  Will adjust diuretic dose  Subjective:   Reports that she continues to feel poorly and inquires why she is not improving faster.   Objective:   BP 123/62 (BP Location: Right Arm)   Pulse 72   Temp 98.1 F (36.7 C) (Oral)   Resp 16   Ht 5' 1.5" (1.562 m)   Wt 67.2 kg   SpO2 92%   BMI 27.53 kg/m   Intake/Output Summary (Last 24 hours) at 08/04/2018 1114 Last data filed at 08/04/2018 1100 Gross per 24 hour  Intake 220 ml  Output 2975 ml  Net -2755 ml   Weight change: -2.495 kg  Physical Exam: Gen: Appears comfortable resting in bed CVS: Pulse regular rhythm, normal rate, S1 and S2 normal Resp: Diminished breath sounds over bases-poor inspiratory effort, no distinct rales Abd: Soft, obese, nontender Ext: 2+ left lower extremity edema, 1+ right lower extremity edema  Imaging: Dg Chest Port 1 View  Result Date: 08/03/2018 CLINICAL DATA:  Dyspnea EXAM: PORTABLE CHEST 1 VIEW COMPARISON:  07/29/2018 chest radiograph. FINDINGS: Stable cardiomediastinal silhouette with normal heart size. No pneumothorax. Small left pleural effusion, stable. Trace right pleural effusion, stable. No pulmonary edema. Left basilar lung opacity, similar. IMPRESSION: 1. Stable small left and trace right pleural  effusions. 2. Stable left basilar lung opacity, favor atelectasis. Electronically Signed   By: Ilona Sorrel M.D.   On: 08/03/2018 15:13   US Abdomen Limited Ruq  Result Date: 08/02/2018 CLINICAL DATA:  Anasarca EXAM: ULTRASOUND ABDOMEN LIMITED RIGHT UPPER QUADRANT COMPARISON:  CT 07/24/2018 FINDINGS: Gallbladder: Gallbladder is distended. Sludge seen within the gallbladder. There Is mild gallbladder wall thickening, 3-4 mm. No visible stones. Common bile duct: Diameter: Normal caliber, 6 mm Liver: No focal lesion identified. Within normal limits in parenchymal echogenicity. Portal vein is patent on color Doppler imaging with normal direction of blood flow towards the liver. Perihepatic ascites and ascites noted around the gallbladder. IMPRESSION: Distended gallbladder with sludge and mild gallbladder wall thickening. Consider further evaluation with nuclear medicine hepatobiliary scan if there is concern of cystic duct patency. Perihepatic ascites. Electronically Signed   By: Rolm Baptise M.D.   On: 08/02/2018 17:04    Labs: BMET Recent Labs  Lab 07/29/18 0533 07/30/18 2409 07/31/18 7353 08/01/18 0532 08/02/18 0558 08/03/18 0417 08/04/18 0442  NA 135 136 139 140 141 141 139  K 3.7 3.4* 3.1* 4.1 3.6 3.6 3.4*  CL 92* 95* 99 100 97* 95* 89*  CO2 26 29 26 27 29 31  36*  GLUCOSE 321* 222* 100* 172* 96 172* 209*  BUN 53* 65* 57* 65* 70* 70* 73*  CREATININE 3.99* 3.98* 3.25* 2.88* 2.77* 2.58* 2.52*  CALCIUM 6.1* 6.5* 6.4* 7.3* 7.3* 7.3* 7.4*  PHOS 5.8*  --   --   --   --   --   --    CBC  Recent Labs  Lab 07/29/18 0533 07/31/18 0516 08/03/18 0417  WBC 22.2* 11.8* 12.0*  NEUTROABS  --  9.3* 9.6*  HGB 10.7* 10.7* 11.3*  HCT 33.4* 33.1* 35.8*  MCV 88.8 90.4 92.5  PLT 173 140* 184    Medications:    . Gerhardt's butt cream   Topical BID  . heparin injection (subcutaneous)  5,000 Units Subcutaneous Q8H  . insulin aspart  0-9 Units Subcutaneous TID WC  . mouth rinse  15 mL Mouth Rinse  BID  . metolazone  2.5 mg Oral BID  . metoprolol tartrate  12.5 mg Oral BID  . nystatin   Topical BID  . predniSONE  40 mg Oral Q breakfast  . sodium chloride flush  10-40 mL Intracatheter Q12H  . vancomycin  125 mg Oral QID   Elmarie Shiley, MD 08/04/2018, 11:14 AM

## 2018-08-04 NOTE — Progress Notes (Signed)
Physical Therapy Treatment Patient Details Name: Shelby Walker MRN: [id number] DOB: 1933-09-13 Today's Date: 08/04/2018    History of Present Illness 85 year old female who presented with nausea, vomiting and diarrhea x 3 weeks and admitted with sepsis secondary to C. Diff colitis.  PMHx of DM, HTN, GERD, RA    PT Comments    Pt progressing slowly  and requires physical assist with all mobility.  General transfer comment: stand pivot sit 1/4 turn to [facility] with increased time to power up and 25% VC's on turn completion. General Gait Details: + 2 side by side assist a limited distance due to increased c/o dizziness and L knee buckled x 2.  Recliner following.  HIGH FALL RISK.  Per chart review, family would like to take her home.  Pt is not able to amb on her own and her L knee buckles without notice.  Pt will need 24/7 assist with all mobility and ADL's.    Follow Up Recommendations  SNF;Supervision/Assistance - 24 hour     Equipment Recommendations  Wheelchair (measurements PT);Wheelchair cushion (measurements PT);Other (comment)    Recommendations for Other Services       Precautions / Restrictions Precautions Precautions: Fall Precaution Comments: "bda" L knee/dizzy Restrictions Weight Bearing Restrictions: No    Mobility  Bed Mobility Overal bed mobility: Needs Assistance Bed Mobility: Supine to Sit;Rolling Rolling: Min assist     Sit to supine: Max assist;+2 for physical assistance   General bed mobility comments: OOB in recliner   Transfers Overall transfer level: Needs assistance Equipment used: None;Rolling walker (2 wheeled) Transfers: Sit to/from [facility] Sit to Stand: Mod assist Stand pivot transfers: Mod assist       General transfer comment: stand pivot sit 1/4 turn to [facility] with increased time to power up and 25% VC's on turn completion.    Ambulation/Gait Ambulation/Gait assistance: Mod assist;Max assist;+2 physical assistance;+2  safety/equipment Gait Distance (Feet): 8 Feet Assistive device: Rolling walker (2 wheeled) Gait Pattern/deviations: Step-to pattern;Decreased stance time - left Gait velocity: decreased    General Gait Details: + 2 side by side assist a limited distance due to increased c/o dizziness and L knee buckled x 2.  Recliner following.  HIGH FALL RISK.   Stairs             Wheelchair Mobility    Modified Rankin (Stroke Patients Only)       Balance Overall balance assessment: Needs assistance Sitting-balance support: No upper extremity supported;Feet unsupported Sitting balance-Leahy Scale: Fair     Standing balance support: Bilateral upper extremity supported Standing balance-Leahy Scale: Zero Standing balance comment: requiring max A +2 to maintain balance                            Cognition Arousal/Alertness: Awake/alert Behavior During Therapy: WFL for tasks assessed/performed Overall Cognitive Status: Within Functional Limits for tasks assessed                                 General Comments: AxO x 3      Exercises      General Comments        Pertinent Vitals/Pain Pain Assessment: Faces Pain Score: 0-No pain Faces Pain Scale: Hurts a little bit Pain Location: back Pain Descriptors / Indicators: Constant;Discomfort Pain Intervention(s): Monitored during session;Repositioned    Home Living  Prior Function            PT Goals (current goals can now be found in the care plan section) Acute Rehab PT Goals Patient Stated Goal: to get stronger Progress towards PT goals: Progressing toward goals    Frequency    Min 3X/week      PT Plan Current plan remains appropriate    Co-evaluation              AM-PAC PT "6 Clicks" Mobility   Outcome Measure  Help needed turning from your back to your side while in a flat bed without using bedrails?: A Little Help needed moving from lying on  your back to sitting on the side of a flat bed without using bedrails?: A Lot Help needed moving to and from a bed to a chair (including a wheelchair)?: Total Help needed standing up from a chair using your arms (e.g., wheelchair or bedside chair)?: Total Help needed to walk in hospital room?: Total Help needed climbing 3-5 steps with a railing? : Total 6 Click Score: 9    End of Session Equipment Utilized During Treatment: Gait belt Activity Tolerance: Patient limited by fatigue Patient left: in chair;with call bell/phone within reach;with chair alarm set Nurse Communication: Mobility status PT Visit Diagnosis: Other abnormalities of gait and mobility (R26.89)     Time: 1455-1520 PT Time Calculation (min) (ACUTE ONLY): 25 min  Charges:  $Gait Training: 8-22 mins $Therapeutic Activity: 8-22 mins                     Rica Koyanagi  PTA Acute  Rehabilitation Services Pager      [phone] Office      [phone]

## 2018-08-04 NOTE — Progress Notes (Signed)
PROGRESS NOTE    Shelby Walker  VHQ:[id number] DOB: [birth year] DOA: 07/24/2018 PCP: Reynold Bowen, MD    Brief Narrative:   Patient is 85 year old female with history of type 2 diabetes on oral hypoglycemics, hypertension, hyperlipidemia, GERD and rheumatoid arthritis on chronic prednisone therapy who presented to the hospital on 07/24/2018 with nausea, vomiting, diarrhea ongoing for about 3 weeks.  Patient was seen at urgent care and diagnosed with infectious colitis and had completed 7 days course of ciprofloxacin and Flagyl without improvement of symptoms.  On arrival to the emergency room, patient was febrile, tachycardic.  COVID-19 test was negative.  CT scan abdomen pelvis showed diffuse colitis without perforation or abscess.  Patient was admitted to the general medical floor and started on treatment.  Patient started becoming hypotensive and tachycardic on the evening of 07/25/2018 and she was transferred to stepdown.  She developed acute kidney injury, leukocytosis worsened and blood pressure further dropped and was transferred to ICU on 07/26/2018.  Since then she has been diagnosed with C. difficile colitis, acute kidney injury.  5/21 Admitted 5/23 Transferred to ICU for hypotension requiring vasopressors; DNR/DNI 5/24 off pressors; a fib with RVR, started amiodarone 5/26 sinus rhythm Transferred out of ICU.  Patient remains anuric. 5/27 patient has 100-150 mL urine output for last few days.  14 L positive balance and diuresing. Diuresing with improvement. 08/02/2018: Transfer to telemetry.  Diuresing.  Heart rate better with occasional low blood pressures.  Assessment & Plan:   Principal Problem:   Clostridium difficile colitis Active Problems:   Rheumatoid arthritis ([place])   Diabetes mellitus ([place])   GERD (gastroesophageal reflux disease)   Diverticulitis   Colitis presumed infectious   Colitis   Shock ([place])  Shock: Septic shock from C. difficile colitis and sepsis present on  admission. Currently adequately improved.  Hemodynamically improving.  Off all antibiotics and remains on oral vancomycin for day 10/14 days. Was on stress dose of IV steroids, now on oral steroids.  Blood cultures were negative.  C. difficile colitis: Clinically improving. Has occasional loose stool.  Will extend oral vancomycin for 14 days.  Acute kidney injury with oliguric renal failure on chronic kidney disease stage III: Patient had significant positive balance and is  treated by IV Lasix with improvement.  Seen by nephrology.   Continues to improve. Adequate urine output now.  Hypocalcemia: Improved  Hypokalemia: We will continue oral replacement.  Normalized.  Type 2 diabetes: Exacerbated by high steroid use and IV dextrose use.  Currently on Lantus and sliding scale insulin.  Blood sugars are fair today. Discontinued long acting insulin.  Hypertension: Home blood pressure medications on hold.  Resume metoprolol and Cardizem.  GERD: On PPI continue.  Rheumatoid arthritis: On Enbrel and prednisone at home.  Enbrel on hold.  On prednisone.  Physical debility and deconditioning: Continue to work with PT OT now.  Recommended SNF but patient wants to go home.  A. fib with RVR in the setting of sepsis: Patient known paroxysmal A. fib.  Treated with amiodarone in ICU.  Patient now remains on metoprolol and sinus rhythm.  DVT prophylaxis: Heparin subcu Code Status: DNR/DNI Family Communication: updated daughter over the phone. Disposition Plan: Inpatient. Family does not want SNF, will send referral to acute rehab.  Consultants:   PCCM, signed off,  Nephrology   Procedures:   None  Antimicrobials:  Flagyl 5/21 >> 5/23 Zosyn 5/23 >> 5/25 Oral vancomycin 5/22 >> 5/24 Vancomycin enema 5/24 >> 5/26 Oral vancomycin  5/21 >>    Subjective: Patient seen and examined.  No overnight events.  Currently remains sinus rhythm.  Feels weak. No other overnight events.  Foley  catheter removed.  Urinating normally.  Blood pressures better today.   Objective: Vitals:   08/03/18 2200 08/04/18 0024 08/04/18 0402 08/04/18 0726  BP: (!) 158/56 (!) 128/56 (!) 136/51 123/62  Pulse: 69 65 66 72  Resp:  16 16 16   Temp:  (!) 97.5 F (36.4 C) 97.8 F (36.6 C) 98.1 F (36.7 C)  TempSrc:  Oral Oral Oral  SpO2: 100% 96% 94% 92%  Weight:   67.2 kg   Height:        Intake/Output Summary (Last 24 hours) at 08/04/2018 1011 Last data filed at 08/04/2018 0809 Gross per 24 hour  Intake 100 ml  Output 2975 ml  Net -2875 ml   Filed Weights   08/02/18 0501 08/03/18 0512 08/04/18 0402  Weight: 72.7 kg 69.7 kg 67.2 kg    Examination:  Physical Exam  Constitutional: She is oriented to person, place, and time and well-developed, well-nourished, and in no distress. No distress.  HENT:  Head: Normocephalic and atraumatic.  Eyes: Pupils are equal, round, and reactive to light. Conjunctivae are normal.  Neck: Normal range of motion. Neck supple.  Cardiovascular: Normal rate and regular rhythm.  Pulmonary/Chest: Effort normal and breath sounds normal. No respiratory distress.  Abdominal: Soft. Bowel sounds are normal. There is no abdominal tenderness.  Musculoskeletal: Normal range of motion.        General: Edema present.     Comments: 2+ pedal edema   Lymphadenopathy:    She has no cervical adenopathy.  Neurological: She is alert and oriented to person, place, and time.  Skin: Skin is warm and dry.  Psychiatric: Mood and affect normal.     Data Reviewed: I have personally reviewed following labs and imaging studies  CBC: Recent Labs  Lab 07/29/18 0533 07/31/18 0516 08/03/18 0417  WBC 22.2* 11.8* 12.0*  NEUTROABS  --  9.3* 9.6*  HGB 10.7* 10.7* 11.3*  HCT 33.4* 33.1* 35.8*  MCV 88.8 90.4 92.5  PLT 173 140* 950   Basic Metabolic Panel: Recent Labs  Lab 07/29/18 0533  07/31/18 0516 08/01/18 0532 08/02/18 0558 08/03/18 0417 08/04/18 0442  NA 135   < >  139 140 141 141 139  K 3.7   < > 3.1* 4.1 3.6 3.6 3.4*  CL 92*   < > 99 100 97* 95* 89*  CO2 26   < > 26 27 29 31  36*  GLUCOSE 321*   < > 100* 172* 96 172* 209*  BUN 53*   < > 57* 65* 70* 70* 73*  CREATININE 3.99*   < > 3.25* 2.88* 2.77* 2.58* 2.52*  CALCIUM 6.1*   < > 6.4* 7.3* 7.3* 7.3* 7.4*  MG 1.9  --  1.9  --   --   --   --   PHOS 5.8*  --   --   --   --   --   --    < > = values in this interval not displayed.   GFR: Estimated Creatinine Clearance: 15 mL/min (A) (by C-G formula based on SCr of 2.52 mg/dL (H)). Liver Function Tests: No results for input(s): AST, ALT, ALKPHOS, BILITOT, PROT, ALBUMIN in the last 168 hours. No results for input(s): LIPASE, AMYLASE in the last 168 hours. No results for input(s): AMMONIA in the last 168 hours. Coagulation  Profile: Recent Labs  Lab 08/02/18 0918  INR 1.1   Cardiac Enzymes: Recent Labs  Lab 07/29/18 0533 07/29/18 1020 07/29/18 1725  TROPONINI 0.03* <0.03 0.03*   BNP (last 3 results) No results for input(s): PROBNP in the last 8760 hours. HbA1C: No results for input(s): HGBA1C in the last 72 hours. CBG: Recent Labs  Lab 08/03/18 0745 08/03/18 1200 08/03/18 1656 08/03/18 2137 08/04/18 0726  GLUCAP 156* 167* 231* 186* 177*   Lipid Profile: No results for input(s): CHOL, HDL, LDLCALC, TRIG, CHOLHDL, LDLDIRECT in the last 72 hours. Thyroid Function Tests: No results for input(s): TSH, T4TOTAL, FREET4, T3FREE, THYROIDAB in the last 72 hours. Anemia Panel: No results for input(s): VITAMINB12, FOLATE, FERRITIN, TIBC, IRON, RETICCTPCT in the last 72 hours. Sepsis Labs: No results for input(s): PROCALCITON, LATICACIDVEN in the last 168 hours.  Recent Results (from the past 240 hour(s))  MRSA PCR Screening     Status: None   Collection Time: 07/25/18  6:07 PM  Result Value Ref Range Status   MRSA by PCR NEGATIVE NEGATIVE Final    Comment:        The GeneXpert MRSA Assay (FDA approved for NASAL specimens only), is one  component of a comprehensive MRSA colonization surveillance program. It is not intended to diagnose MRSA infection nor to guide or monitor treatment for MRSA infections. Performed at [facility], [place] [address], [place], [place] [address]   Culture, blood (routine x 2)     Status: None   Collection Time: 07/26/18  8:57 AM  Result Value Ref Range Status   Specimen Description   Final    RIGHT ANTECUBITAL Performed at [place] [address], [place], [place] [address]    Special Requests   Final    BOTTLES DRAWN AEROBIC ONLY Blood Culture adequate volume Performed at [place] [address], [place], [place] [address]    Culture   Final    NO GROWTH 5 DAYS Performed at [facility], [place] [address], [place], [place] [address]    Report Status 07/31/2018 FINAL  Final  Culture, blood (routine x 2)     Status: None   Collection Time: 07/26/18  8:57 AM  Result Value Ref Range Status   Specimen Description   Final    BLOOD LEFT HAND Performed at [place] [address] [place], [place] [address]    Special Requests   Final    BOTTLES DRAWN AEROBIC ONLY Blood Culture results may not be optimal due to an inadequate volume of blood received in culture bottles Performed at [place] [address], [place], [place] [address]    Culture   Final    NO GROWTH 5 DAYS Performed at [facility], [place] [address], [place], [place] [address]    Report Status 07/31/2018 FINAL  Final         Radiology Studies: Dg Chest Port 1 View  Result Date: 08/03/2018 CLINICAL DATA:  Dyspnea EXAM: PORTABLE CHEST 1 VIEW COMPARISON:  07/29/2018 chest radiograph. FINDINGS: Stable cardiomediastinal silhouette with normal heart size. No pneumothorax. Small left pleural effusion, stable. Trace right pleural effusion, stable. No pulmonary edema. Left basilar lung opacity, similar.  IMPRESSION: 1. Stable small left and trace right pleural effusions. 2. Stable left basilar lung opacity, favor atelectasis. Electronically Signed   By: Ilona Sorrel M.D.   On: 08/03/2018 15:13   US Abdomen Limited Ruq  Result Date: 08/02/2018 CLINICAL DATA:  Anasarca EXAM: ULTRASOUND ABDOMEN LIMITED RIGHT UPPER QUADRANT COMPARISON:  CT 07/24/2018 FINDINGS: Gallbladder: Gallbladder is distended. Sludge seen within the gallbladder. There Is mild gallbladder wall thickening, 3-4 mm. No visible stones. Common bile duct: Diameter: Normal caliber, 6 mm Liver: No focal lesion identified. Within normal limits in parenchymal echogenicity. Portal vein is patent on color Doppler imaging with normal direction of blood flow towards the liver. Perihepatic ascites and ascites noted around the gallbladder. IMPRESSION: Distended gallbladder with sludge and mild gallbladder wall thickening. Consider further evaluation with nuclear medicine hepatobiliary scan if there is concern of cystic duct patency. Perihepatic ascites. Electronically Signed   By: Rolm Baptise M.D.   On: 08/02/2018 17:04        Scheduled Meds:  Gerhardt's butt cream   Topical BID   heparin injection (subcutaneous)  5,000 Units Subcutaneous Q8H   insulin aspart  0-9 Units Subcutaneous TID WC   mouth rinse  15 mL Mouth Rinse BID   metolazone  2.5 mg Oral BID   metoprolol tartrate  12.5 mg Oral BID   nystatin   Topical BID   predniSONE  40 mg Oral Q breakfast   sodium chloride flush  10-40 mL Intracatheter Q12H   vancomycin  125 mg Oral QID   Continuous Infusions:  furosemide 120 mg (08/04/18 0530)     LOS: 11 days    Time spent: 25 minutes    Barb Merino, MD [facility] Pager [phone]  If 7PM-7AM, please contact night-coverage [email or web address] Password [email or web address] 08/04/2018, 10:11 AM

## 2018-08-04 NOTE — Progress Notes (Signed)
Patient voided 500 cc on BSC, bladder scan immediately after shows 11cc.  Patient states she feels her bladder is empty

## 2018-08-04 NOTE — Progress Notes (Signed)
Patient up to [facility] and voided again - 450cc.  Bladder scan done 1 hour later showing 127cc.  Will continue to monitor

## 2018-08-04 NOTE — Progress Notes (Signed)
Patient called and stated she feels she has to urinate because her bladder feel full and pressure, foley inplace put out about 450 between 10pm and 2am, bladder scan showed 286cc,  foley D/C'd, got patient up to void, output less than 50cc, I/O cath done with 500cc out and patient stated she feels better. Will continue to assess patient.

## 2018-08-04 NOTE — Progress Notes (Signed)
Initial Nutrition Assessment  RD working remotely.   DOCUMENTATION CODES:   Not applicable  INTERVENTION:  - will order Boost Breeze BID, each supplement provides 250 kcal and 9 grams of protein. - will order Ensure Enlive BID, each supplement provides 350 kcal and 20 grams of protein. - will order daily multivitamin with minerals. - continue to encourage PO intakes.    NUTRITION DIAGNOSIS:   Inadequate oral intake related to acute illness, decreased appetite as evidenced by meal completion < 50%.  GOAL:   Patient will meet greater than or equal to 90% of their needs  MONITOR:   PO intake, Supplement acceptance, Labs, Weight trends, I & O's  REASON FOR ASSESSMENT:   LOS(day #11)  ASSESSMENT:   85 year old female with history of type 2 DM on oral hypoglycemics, HTN, hyperlipidemia, GERD, and rheumatoid arthritis on chronic prednisone therapy. She presented to the ED on 5/21 with N/V/D ongoing for ~3 weeks. Patient was seen at urgent care and diagnosed with infectious colitis and had completed 7 days course of ciprofloxacin and Flagyl without improvement of symptoms.  On arrival to the emergency room, patient was febrile, tachycardic. COVID-19 test was negative. CT scan abdomen/pelvis showed diffuse colitis without perforation or abscess.  Patient was admitted to the general medical floor and started on treatment. She became hypotensive and tachycardic on the evening of 5/22 and she was transferred to SDU. She developed AKI, leukocytosis worsened and blood pressure further dropped on 5/23.    Weight on admission was 57.2 kg/126 lb and this weight was used to estimate nutrition needs as it is closer to weight trends since 08/09/16. Current weight +10 kg/22 lb compared to admission weight. Patient mainly NPO or on CLD from admission (5/23)-5/28 when diet advanced to FLD. Diet was then advanced from FLD to Regular on 5/29 at 10:00 AM. Patient has been eating 0-25% of meals (other than 50%  of breakfast on 5/30) since advancement to Regular.   Per notes: sepsis--improving, C. Diff--improving, AKI on stage 3 CKD, hypocalcemia--improving, hypokalemia--nearly resolved, physical debility and deconditioning with PT/OT onboard. MD notes indicate that patient's family does not want her to go to SNF so referral is being sent to acute rehab facilities.    Medications reviewed; 80 mg IV lasix TID, sliding scale novolog, 20 mEq K-Dur x2 doses 6/1.  Labs reviewed; CBG: 177 mg/dl today, K: 3.4 mmol/l, Cl: 89 mmol/l, BUN: 73 mg/dl, creatinine: 2.52 mg/dl, Ca: 7.4 mg/dl, GFR: 17 ml/min.     NUTRITION - FOCUSED PHYSICAL EXAM:  unable to complete at this time.   Diet Order:   Diet Order            Diet regular Room service appropriate? Yes; Fluid consistency: Thin  Diet effective now              EDUCATION NEEDS:   Not appropriate for education at this time  Skin:  Skin Assessment: Reviewed RN Assessment  Last BM:  6/1  Height:   Ht Readings from Last 1 Encounters:  07/24/18 5' 1.5" (1.562 m)    Weight:   Wt Readings from Last 1 Encounters:  08/04/18 67.2 kg    Ideal Body Weight:  48.9 kg  BMI:  Body mass index is 27.53 kg/m.  Estimated Nutritional Needs:   Kcal:  1430-1715 kcal  Protein:  65-75 grams  Fluid:  >/= 2 L/day      Jarome Matin, MS, RD, LDN, [facility] Inpatient Clinical Dietitian Pager # [phone] After hours/weekend  pager # [phone]

## 2018-08-04 NOTE — Progress Notes (Signed)
CRITICAL VALUE ALERT  Critical Value: Pt had 7 beats of v tach Date & Time Notied: 08/03/18 @ 2145 Provider Notified: Dr Kennon Holter Orders Received/Actions taken: Vitals taken, morphine given and MD notified of actions taken and awaiting further instructions from provider.

## 2018-08-05 LAB — RENAL FUNCTION PANEL
Albumin: 2.5 g/dL — ABNORMAL LOW (ref 3.5–5.0)
Anion gap: 15 (ref 5–15)
BUN: 74 mg/dL — ABNORMAL HIGH (ref 8–23)
CO2: 40 mmol/L — ABNORMAL HIGH (ref 22–32)
Calcium: 7.7 mg/dL — ABNORMAL LOW (ref 8.9–10.3)
Chloride: 87 mmol/L — ABNORMAL LOW (ref 98–111)
Creatinine, Ser: 2.12 mg/dL — ABNORMAL HIGH (ref 0.44–1.00)
GFR calc Af Amer: 24 mL/min — ABNORMAL LOW (ref >60)
GFR calc non Af Amer: 21 mL/min — ABNORMAL LOW (ref >60)
Glucose, Bld: 203 mg/dL — ABNORMAL HIGH (ref 70–99)
Phosphorus: 4.8 mg/dL — ABNORMAL HIGH (ref 2.5–4.6)
Potassium: 3.6 mmol/L (ref 3.5–5.1)
Sodium: 142 mmol/L (ref 135–145)

## 2018-08-05 LAB — GLUCOSE, CAPILLARY
Glucose-Capillary: 75 mg/dL (ref 70–99)
Glucose-Capillary: 146 mg/dL — ABNORMAL HIGH (ref 70–99)
Glucose-Capillary: 238 mg/dL — ABNORMAL HIGH (ref 70–99)
Glucose-Capillary: 190 mg/dL — ABNORMAL HIGH (ref 70–99)
Glucose-Capillary: 264 mg/dL — ABNORMAL HIGH (ref 70–99)
Glucose-Capillary: 418 mg/dL — ABNORMAL HIGH (ref 70–99)
Glucose-Capillary: 327 mg/dL — ABNORMAL HIGH (ref 70–99)

## 2018-08-05 MED ORDER — FUROSEMIDE 10 MG/ML IJ SOLN
80.0000 mg | Freq: Two times a day (BID) | INTRAMUSCULAR | Status: DC
  Administered 2018-08-05 – 2018-08-06 (×2): 80 mg via INTRAVENOUS
  Filled 2018-08-05 (×2): qty 8

## 2018-08-05 MED ORDER — INSULIN ASPART 100 UNIT/ML ~~LOC~~ SOLN
5.0000 [IU] | Freq: Once | SUBCUTANEOUS | Status: AC
  Administered 2018-08-05: 5 [IU] via SUBCUTANEOUS

## 2018-08-05 MED ORDER — PREDNISONE 5 MG PO TABS
30.0000 mg | ORAL_TABLET | Freq: Every day | ORAL | Status: DC
  Administered 2018-08-06: 30 mg via ORAL
  Filled 2018-08-05: qty 1

## 2018-08-05 MED ORDER — POTASSIUM CHLORIDE CRYS ER 20 MEQ PO TBCR
20.0000 meq | EXTENDED_RELEASE_TABLET | Freq: Two times a day (BID) | ORAL | Status: AC
  Administered 2018-08-05 (×2): 20 meq via ORAL
  Filled 2018-08-05 (×2): qty 1

## 2018-08-05 NOTE — Progress Notes (Signed)
Inpatient Diabetes Program Recommendations  AACE/ADA: New Consensus Statement on Inpatient Glycemic Control (2015)  Target Ranges:  Prepandial:   less than 140 mg/dL      Peak postprandial:   less than 180 mg/dL (1-2 hours)      Critically ill patients:  140 - 180 mg/dL   Lab Results  Component Value Date   GLUCAP 264 (H) 08/05/2018   HGBA1C 8.8 (H) 07/24/2018    Review of Glycemic Control  Diabetes history: DM2 Outpatient Diabetes medications: NPH 20 units QHS,  Current orders for Inpatient glycemic control: Novolog 0-9 units tidwc  Inpatient Diabetes Program Recommendations:     Add NPH 10 units QHS (1/2 home dose)  Continue to follow.  Thank you. Lorenda Peck, RD, LDN, CDE Inpatient Diabetes Coordinator [phone]

## 2018-08-05 NOTE — Progress Notes (Signed)
Text paged MD about patient glucose of 418.  Gave pt. 9 units of insulin. MD changed diet order to carb modified.

## 2018-08-05 NOTE — PMR Pre-admission (Signed)
PMR Admission Coordinator Pre-Admission Assessment  Patient: Shelby Walker is an 85 y.o., female MRN: [id number] DOB: Sep 16, 1933 Height: 5' 1.5" (156.2 cm) Weight: 64.2 kg  Insurance Information HMO:     PPO:      PCP:      IPA:      80/20: yes     OTHER:  PRIMARY: Medicare A and B      Policy#: [id number]      Subscriber: Patient CM Name:       Phone#:      Fax#:  Pre-Cert#:       Employer:  Benefits:  Phone #: NA     Name: verified eligibility online via [place] on 08/05/18 Eff. Date: Part A: effective 06/04/99; Part B: effective 06/04/99     Deduct: $1,408      Out of Pocket Max: NA      Life Max: NA CIR: Covered per Medicare guidelines once yearly deductible is met      SNF: days 1-20, 100%, days 21-100, 80% Outpatient: 80%     Co-Pay: 20% Home Health: 100%      Co-Pay:  DME: 80%     Co-Pay: 20% Providers: Pt's choice SECONDARY: [employer]      Policy#: [id number]      Subscriber: Patient CM Name:       Phone#:      Fax#:  Pre-Cert#:       Employer:  Benefits:  Phone #: [phone]     Name:  Eff. Date:      Deduct:       Out of Pocket Max:       Life Max:  CIR:       SNF:  Outpatient:      Co-Pay:  Home Health:       Co-Pay: DME:      Co-Pay:  Medicaid Application Date:       Case Manager:  Disability Application Date:       Case Worker:   The "Data Collection Information Summary" for patients in Inpatient Rehabilitation Facilities with attached "Privacy Act [place] Records" was provided and verbally reviewed with: Family  Emergency Contact Information Contact Information    Name Relation Home Work [place] Daughter   [place] [phone]     Daesha, [place]   [phone]      Current Medical History  Patient Admitting Diagnosis: Debility from septic shock with C.difficile colitis   History of Present Illness: Pt is an 15 you F with history of Type 2 DM, HTN, hyperlipidemia, GERD, and RA on chronic prednisone with  presented to the hospital on 07/24/18 with nausea, vomiting, diarrhea for the past 3 weeks. Pt had previously been seen in urgent care and diagnoses with infectious colitis and treated with 7 days of ciprofloxacin and Flagyl but had no improvement of symptoms. Once she presented to the hospital, she was febrile, tachycardic, with CT showing diffuse colitis; no perforation or abscess noted. She developed AKI, worsening leukocytosis and even lower BP. On 5/22 pt became tachycardic and hypotensive; pt was subsequently transferred to ICU on 5/23 requiring vasopressors. On 5/24, pt developed a fib with RVR and started on amiodarone with pt noted to be in sinus rhythm on 5/26. Pt required diuresing with IV lasix due to 14 L positive balance with improvement. Pt was diagnosed with C. Difficile colitis and showing clinical improvement. Pt presents with debility from multiple medical issues. Pt  has 24/7 A at [place] from family members and pt motivated to return straight home from [place]. Pt and family want to pursue CIR at this time for short term, intensive rehab to allow her to return home safely with assist.     Patient's medical record from [facility] has been reviewed by the rehabilitation admission coordinator and physician.  Past Medical History  Past Medical History:  Diagnosis Date  . Arthritis, rheumatoid ([place])   . Bruit    Carotid Doppler showed no significant abnormality     9per patient)  . Chest pain, unspecified    Nuclear, May, 2008, no scar or ischemia  . Diabetes mellitus   . Dyslipidemia   . Ejection fraction    EF 55-60%, echo, February, 2011  . GERD (gastroesophageal reflux disease)   . Mitral regurgitation April 21, 2009   mild,  echo, February, 2011  . Osteoporosis   . Palpitations    possible very brief atrial fibrillation on monitor and possible reentrant tachycardia  . Psoriasis   . Rheumatoid arthritis ([place])     Family History   family history includes  Diabetes in her father and mother; Heart attack in her father and mother; Heart failure in her father and mother; Stroke in her sister.  Prior Rehab/Hospitalizations Has the patient had prior rehab or hospitalizations prior to admission? No  Has the patient had major surgery during 100 days prior to admission? No   Current Medications  Current Facility-Administered Medications:  .  acetaminophen (TYLENOL) tablet 650 mg, 650 mg, Oral, Q6H PRN, 650 mg at 08/02/18 0526 **OR** acetaminophen (TYLENOL) suppository 650 mg, 650 mg, Rectal, Q6H PRN, [place] Eric J, DO, 650 mg at 07/25/18 0256 .  albuterol (PROVENTIL) (2.5 MG/3ML) 0.083% nebulizer solution 2.5 mg, 2.5 mg, Nebulization, Q3H PRN, Anders Simmonds, MD, 2.5 mg at 07/31/18 1027 .  feeding supplement (BOOST / RESOURCE BREEZE) liquid 1 Container, 1 Container, Oral, BID BM, Barb Merino, MD, 1 Container at 08/06/18 [phone] .  feeding supplement (ENSURE ENLIVE) (ENSURE ENLIVE) liquid 237 mL, 237 mL, Oral, BID BM, Barb Merino, MD, 237 mL at 08/05/18 1209 .  furosemide (LASIX) injection 80 mg, 80 mg, Intravenous, Q12H, Elmarie Shiley, MD, 80 mg at 08/06/18 0656 .  Gerhardt's butt cream, , Topical, BID, Adhikari, Amrit, MD .  heparin injection 5,000 Units, 5,000 Units, Subcutaneous, Q8H, Margaretha Seeds, MD, 5,000 Units at 08/06/18 [phone] .  insulin aspart (novoLOG) injection 0-9 Units, 0-9 Units, Subcutaneous, TID WC, [place] Eric J, DO, 2 Units at 08/06/18 9528 .  MEDLINE mouth rinse, 15 mL, Mouth Rinse, BID, Margaretha Seeds, MD, 15 mL at 08/05/18 2133 .  metoprolol tartrate (LOPRESSOR) tablet 12.5 mg, 12.5 mg, Oral, BID, Roney Jaffe, MD, 12.5 mg at 08/06/18 4132 .  morphine 2 MG/ML injection 2 mg, 2 mg, Intravenous, Q4H PRN, Shelly Coss, MD, 2 mg at 08/03/18 2159 .  multivitamin with minerals tablet 1 tablet, 1 tablet, Oral, Daily, Ghimire, Kuber, MD, 1 tablet at 08/06/18 0814 .  nitroGLYCERIN (NITROSTAT) SL tablet 0.4 mg, 0.4 mg,  Sublingual, Q5 min PRN, Anders Simmonds, MD, 0.4 mg at 08/05/18 0954 .  nystatin (MYCOSTATIN/NYSTOP) topical powder, , Topical, BID, Adhikari, Amrit, MD .  ondansetron (ZOFRAN) tablet 4 mg, 4 mg, Oral, Q6H PRN **OR** ondansetron (ZOFRAN) injection 4 mg, 4 mg, Intravenous, Q6H PRN, [place] Eric J, DO, 4 mg at 08/04/18 1601 .  oxyCODONE (Oxy IR/ROXICODONE) immediate release tablet 5 mg, 5 mg,  Oral, Q6H PRN, Barb Merino, MD, 5 mg at 08/06/18 0813 .  polyvinyl alcohol (LIQUIFILM TEARS) 1.4 % ophthalmic solution 1 drop, 1 drop, Both Eyes, PRN, Anders Simmonds, MD .  predniSONE (DELTASONE) tablet 30 mg, 30 mg, Oral, Q breakfast, Barb Merino, MD, 30 mg at 08/06/18 8563 .  sodium chloride flush (NS) 0.9 % injection 10-40 mL, 10-40 mL, Intracatheter, Q12H, Margaretha Seeds, MD, 10 mL at 08/05/18 2134 .  sodium chloride flush (NS) 0.9 % injection 10-40 mL, 10-40 mL, Intracatheter, PRN, Margaretha Seeds, MD .  vancomycin (VANCOCIN) 50 mg/mL oral solution 125 mg, 125 mg, Oral, QID, Barb Merino, MD, 125 mg at 08/06/18 1497  Patients Current Diet:  Diet Order            Diet Carb Modified Fluid consistency: Thin; Room service appropriate? Yes  Diet effective now              Precautions / Restrictions Precautions Precautions: Fall Precaution Comments: "bda" L knee/dizzy Restrictions Weight Bearing Restrictions: No   Has the patient had 2 or more falls or a fall with injury in the past year? Yes  Prior Activity Level Community (5-7x/wk): very active and independent PTA and prior to 1 month ago when she became ill with diarrhea. able to drive, was managing her own medicaitons and her husbands  Prior Functional Level Self Care: Did the patient need help bathing, dressing, using the toilet or eating? Independent  Indoor Mobility: Did the patient need assistance with walking from room to room (with or without device)? Independent  Stairs: Did the patient need assistance with internal  or external stairs (with or without device)? Independent  Functional Cognition: Did the patient need help planning regular tasks such as shopping or remembering to take medications? Independent  Home Assistive Devices / Equipment Home Assistive Devices/Equipment: Environmental consultant (specify type) Home Equipment: Walker - 2 wheels, Cane - single point  Prior Device Use: Indicate devices/aids used by the patient prior to current illness, exacerbation or injury? None of the above  Current Functional Level Cognition  Overall Cognitive Status: Within Functional Limits for tasks assessed Orientation Level: Oriented X4 General Comments: AxO x 3    Extremity Assessment (includes Sensation/Coordination)  Upper Extremity Assessment: Generalized weakness(hand deformities from RA)  Lower Extremity Assessment: Generalized weakness    ADLs  Overall ADL's : Needs assistance/impaired Eating/Feeding: Set up, Sitting, Bed level Grooming: Wash/dry face, Oral care, Sitting Grooming Details (indicate cue type and reason): to don chapstick up in chair Upper Body Bathing: Moderate assistance, Sitting Lower Body Bathing: Sit to/from stand, Cueing for sequencing, Cueing for safety, Maximal assistance Upper Body Dressing : Set up, Sitting Lower Body Dressing: Maximal assistance, Sit to/from stand, Cueing for sequencing, Cueing for safety Toilet Transfer: Moderate assistance, RW, Stand-pivot, Comfort height toilet Toilet Transfer Details (indicate cue type and reason): simulated with recliner Toileting- Clothing Manipulation and Hygiene: Maximal assistance, Sit to/from stand, Cueing for sequencing, Cueing for safety Toileting - Clothing Manipulation Details (indicate cue type and reason): incontinent of stool; on foley Tub/ Shower Transfer: Total assistance Functional mobility during ADLs: Maximal assistance, +2 for physical assistance, +2 for safety/equipment(stand pivot t/f) General ADL Comments: Pt needed much  encouragement.  Pt did get OOB, to BSc then to chair with OT    Mobility  Overal bed mobility: Needs Assistance Bed Mobility: Supine to Sit, Rolling Rolling: Min assist Sidelying to sit: Mod assist Supine to sit: Max assist, +2 for physical assistance Sit to supine:  Max assist, +2 for physical assistance General bed mobility comments: OOB in recliner     Transfers  Overall transfer level: Needs assistance Equipment used: None, Rolling walker (2 wheeled) Transfers: Sit to/from Stand, [employer] Transfers Sit to Stand: Mod assist Stand pivot transfers: Mod assist General transfer comment: stand pivot sit 1/4 turn to [facility] with increased time to power up and 25% VC's on turn completion.      Ambulation / Gait / Stairs / Wheelchair Mobility  Ambulation/Gait Ambulation/Gait assistance: Mod assist, Max assist, +2 physical assistance, +2 safety/equipment Gait Distance (Feet): 8 Feet Assistive device: Rolling walker (2 wheeled) Gait Pattern/deviations: Step-to pattern, Decreased stance time - left General Gait Details: + 2 side by side assist a limited distance due to increased c/o dizziness and L knee buckled x 2.  Recliner following.  HIGH FALL RISK. Gait velocity: decreased     Posture / Balance Balance Overall balance assessment: Needs assistance Sitting-balance support: No upper extremity supported, Feet unsupported Sitting balance-Leahy Scale: Fair Standing balance support: Bilateral upper extremity supported Standing balance-Leahy Scale: Zero Standing balance comment: requiring max A +2 to maintain balance    Special needs/care consideration BiPAP/CPAP : no CPM : no Continuous Drip IV: no Dialysis : no        Days : no Life Vest : no Oxygen : yes, 2L [place] Special Bed : no Trach Size : no Wound Vac (area) : no      Location : no Skin: eczema BLEs, MASD to bilateral buttocks, skin tear to left buttocks         Bowel mgmt: occasional loose stool, clinically improving C.diff.  last BM: 08/06/18 per RN. Bladder mgmt: continent (use of BSC with assistance) Diabetic mgmt: yes Behavioral consideration : no Chemo/radiation : no   Previous Home Environment (from acute therapy documentation) Living Arrangements: Spouse/significant other Available Help at Discharge: Family, Friend(s)(dtr and son law) Type of Home: House Home Layout: Able to live on main level with bedroom/bathroom, Laundry or work area in basement(full flight to basement) Home Access: Other (comment) Bathroom Shower/Tub: Tub/shower unit, Walk-in shower(both but mostly uses walk in) [employer]: [place]: No  Discharge Living Setting Plans for Discharge Living Setting: Patient's home, Lives with (comment)(spouse ) Type of Home at Discharge: House Discharge Home Layout: One level(with basement for laundry only) Discharge Home Access: Stairs to enter Entrance Stairs-Rails: None Entrance Stairs-Number of Steps: 3 Discharge Bathroom Shower/Tub: Tub only, Walk-in shower(mostly uses walk-in) Discharge Bathroom Toilet: Standard Discharge Bathroom Accessibility: Yes How Accessible: Accessible via walker Does the patient have any problems obtaining your medications?: Yes (Describe)(per daughter, Assistance Program for [place] and DM meds)  Social/Family/Support Systems Patient Roles: Spouse(retired; plays cornhole with son) Sport and exercise psychologist Information: [name] (daughter): [phone]; Marlou Sa (son): [phone] (children appear to be main contact as husband is not in best health); children very involved in her care. husband [name]): [phone] Anticipated Caregiver: son, daugther, son-in-law (plan to have schedule for 24/7 A) Anticipated Caregiver's Contact Information: see above Ability/Limitations of Caregiver: min A Caregiver Availability: 24/7 Discharge Plan Discussed with Primary Caregiver: Yes(pt, son, and daugther) Is Caregiver In Agreement with Plan?: Yes Does Caregiver/Family have  Issues with Lodging/Transportation while Pt is in Rehab?: No  Goals/Additional Needs Patient/Family Goal for Rehab: PT/OT: Min A; SLP: NA Expected length of stay: 16-20 days Cultural Considerations: NA Dietary Needs: regular diet, thin liquids Equipment Needs: TBD Pt/Family Agrees to Admission and willing to participate: Yes Program Orientation Provided & Reviewed with Pt/Caregiver  Including Roles  & Responsibilities: Yes(pt, daughter, and son)  Barriers to Discharge: Home environment access/layout  Barriers to Discharge Comments: steps to enter; older house, smaller doorframes, unsure if it is condusive for wc  Decrease burden of Care through IP rehab admission: NA  Possible need for SNF placement upon discharge: Possibly, however pt has great support from family (son, daughter, and son-in-law) who all live nearby and plan to provide 24/7 A at DC. Due to COVID-19 family is highly against SNF placement at this time. Pt is motivated to participate in CIR program for short term rehab and understands the expectations. After bedside assessment, feel pt is able to tolerate CIR as she is progressing in transfers and able to complete gait with assistance.   Patient Condition: I have reviewed medical records from [facility], spoken with CM, and patient, son and daughter. I met with patient at the bedside for inpatient rehabilitation assessment.  Patient will benefit from ongoing PT and OT, can actively participate in 3 hours of therapy a day 5 days of the week, and can make measurable gains during the admission.  Patient will also benefit from the coordinated team approach during an Inpatient Acute Rehabilitation admission.  The patient will receive intensive therapy as well as Rehabilitation physician, nursing, social worker, and care management interventions.  Due to bladder management, bowel management, safety, skin/wound care, disease management, medication administration, pain management and  patient education the patient requires 24 hour a day rehabilitation nursing.  The patient is currently Mod/Max A x2 for 8 feet with mobility and Max a for LB ADLs and toileting.  Discharge setting and therapy post discharge at home with home health is anticipated.  Patient has agreed to participate in the Acute Inpatient Rehabilitation Program and will admit 08/06/18.  Preadmission Screen Completed By:  Jhonnie Garner, 08/06/2018 9:37 AM ______________________________________________________________________   Discussed status with Dr. Posey Pronto on 08/06/18 at 9:51AM and received approval for admission today.  Admission Coordinator:  Jhonnie Garner, OT, time 9:51AM/Date 08/06/18   Assessment/Plan: Diagnosis: Debility   1. Does the need for close, 24 hr/day Medical supervision in concert with the patient's rehab needs make it unreasonable for this patient to be served in a less intensive setting? Potentially  2. Co-Morbidities requiring supervision/potential complications: Type 2 DM, HTN, hyperlipidemia, GERD, and RA on chronic prednisone 3. Due to bowel management, safety, disease management and patient education, does the patient require 24 hr/day rehab nursing? Yes 4. Does the patient require coordinated care of a physician, rehab nurse, PT (1-2 hrs/day, 5 days/week) and OT (1-2 hrs/day, 5 days/week) to address physical and functional deficits in the context of the above medical diagnosis(es)? Yes Addressing deficits in the following areas: balance, endurance, locomotion, strength, transferring, bathing, dressing, toileting and psychosocial support 5. Can the patient actively participate in an intensive therapy program of at least 3 hrs of therapy 5 days a week? Yes 6. The potential for patient to make measurable gains while on inpatient rehab is excellent 7. Anticipated functional outcomes upon discharge from inpatients are: min assist PT, min assist and mod assist OT, n/a SLP 8. Estimated rehab length of stay to  reach the above functional goals is: 16-19 days. 9. Anticipated D/C setting: Home 10. Anticipated post D/C treatments: HH therapy and Home excercise program 11. Overall Rehab/Functional Prognosis: excellent  MD Signature: Delice Lesch, MD, ABPMR

## 2018-08-05 NOTE — Progress Notes (Signed)
Inpatient Rehabilitation-Admissions Coordinator   Met with pt at the bedside this AM. Feel pt is a good candidate for CIR and she is willing to pursue the program at this time. Family in favor of CIR with plans for return home with 24/7 A. AC has spoken with Dr. Sloan Leiter regarding readiness. He feels pt may be ready for CIR tomorrow. Will plan for possible admit tomorrow pending MD assessment tomorrow.   Please call If questions.   Jhonnie Garner, OTR/L  Rehab Admissions Coordinator  [phone] 08/05/2018 12:27 PM

## 2018-08-05 NOTE — Progress Notes (Signed)
PROGRESS NOTE    Shelby Walker  WGY:[id number] DOB: 1933-12-20 DOA: 07/24/2018 PCP: Reynold Bowen, MD    Brief Narrative:   Patient is 85 year old female with history of type 2 diabetes on oral hypoglycemics, hypertension, hyperlipidemia, GERD and rheumatoid arthritis on chronic prednisone therapy who presented to the hospital on 07/24/2018 with nausea, vomiting, diarrhea ongoing for about 3 weeks.  Patient was seen at urgent care and diagnosed with infectious colitis and had completed 7 days course of ciprofloxacin and Flagyl without improvement of symptoms.  On arrival to the emergency room, patient was febrile, tachycardic.  COVID-19 test was negative.  CT scan abdomen pelvis showed diffuse colitis without perforation or abscess.  Patient was admitted to the general medical floor and started on treatment.  Patient started becoming hypotensive and tachycardic on the evening of 07/25/2018 and she was transferred to stepdown.  She developed acute kidney injury, leukocytosis worsened and blood pressure further dropped and was transferred to ICU on 07/26/2018.  Since then she has been diagnosed with C. difficile colitis, acute kidney injury.  5/21 Admitted 5/23 Transferred to ICU for hypotension requiring vasopressors; DNR/DNI 5/24 off pressors; a fib with RVR, started amiodarone 5/26 sinus rhythm Transferred out of ICU.  Patient remained oliguric. 5/27 patient has 100-150 mL urine output for last few days.  14 L positive balance and diuresing. iuresing with improvement. 08/02/2018: Transfer to telemetry.  Diuresing.  Heart rate better with occasional low blood pressures.  Assessment & Plan:   Principal Problem:   Clostridium difficile colitis Active Problems:   Rheumatoid arthritis ([place])   Diabetes mellitus ([place])   GERD (gastroesophageal reflux disease)   Diverticulitis   Colitis presumed infectious   Colitis   Shock ([place])  Shock: Septic shock from C. difficile colitis and sepsis present on  admission. Currently adequately improved.  Hemodynamically improving.  Off all antibiotics and remains on oral vancomycin for day 11/14 days. Was on stress dose of IV steroids, now on oral steroids.  Blood cultures were negative.  We will start gradual taper of prednisone 10 mg every day to go back on her regular doses of prednisone.  C. difficile colitis: Clinically improving. Has occasional loose stool.  Will extend oral vancomycin for 11/14 days.  Acute kidney injury with oliguric renal failure on chronic kidney disease stage III: Patient had significant positive balance and is  treated by IV Lasix with improvement.  Seen by nephrology.   Continues to improve. Adequate urine output now. Foley catheter removed.  Urinating normally. IV Lasix dose was decreased now.  Anticipating change to oral Lasix soon.  Hypocalcemia: Improved  Hypokalemia: We will continue oral replacement.  Normalized.  Type 2 diabetes: Exacerbated by high steroid use and IV dextrose use.  Currently on Lantus and sliding scale insulin.  Blood sugars are fair today. Discontinued long acting insulin.  Keep on sliding scale insulin at this time as patient does not have reliable oral intake.  Hypertension: Fairly stable now on metoprolol.   GERD: On PPI continue.  Rheumatoid arthritis: On Enbrel and prednisone at home.  Enbrel on hold.  On prednisone.  Physical debility and deconditioning: Continue to work with PT OT now.  Patient is accepted to CIR pending clinical improvement.  Anticipate tomorrow.  A. fib with RVR in the setting of sepsis: Patient known paroxysmal A. fib.  Treated with amiodarone in ICU.  Patient now remains on metoprolol and sinus rhythm.  She has a cardiologist follow-up.  Will not restart on anticoagulation  at this time.  DVT prophylaxis: Heparin subcu Code Status: DNR/DNI Family Communication: updated daughter over the phone. Disposition Plan: Inpatient.  Anticipate discharge to acute inpatient  rehab when converted to oral Lasix.  Anticipate tomorrow.  Discussed with rehab admissions.  Consultants:   PCCM, signed off,  Nephrology   Procedures:   None  Antimicrobials:  Flagyl 5/21 >> 5/23 Zosyn 5/23 >> 5/25 Oral vancomycin 5/22 >> 5/24 Vancomycin enema 5/24 >> 5/26 Oral vancomycin 5/21 >>    Subjective: Patient seen and examined.  No overnight events.  Currently remains sinus rhythm.  Feels weak. No other overnight events.  Foley catheter removed.  Urinating normally.  Blood pressures better today. Does have poor appetite otherwise no other events.   Objective: Vitals:   08/04/18 1441 08/04/18 1959 08/05/18 0500 08/05/18 0547  BP: (!) 112/51 (!) 123/53  (!) 136/56  Pulse: 67 61  61  Resp: 14 16  16   Temp: (!) 97.3 F (36.3 C) 98.1 F (36.7 C)  97.6 F (36.4 C)  TempSrc: Oral Oral  Oral  SpO2: 91% 94%  97%  Weight:   66.1 kg   Height:        Intake/Output Summary (Last 24 hours) at 08/05/2018 1039 Last data filed at 08/05/2018 0600 Gross per 24 hour  Intake 490 ml  Output 2750 ml  Net -2260 ml   Filed Weights   08/03/18 0512 08/04/18 0402 08/05/18 0500  Weight: 69.7 kg 67.2 kg 66.1 kg    Examination:  Physical Exam  Constitutional: She is oriented to person, place, and time and well-developed, well-nourished, and in no distress. No distress.  HENT:  Head: Normocephalic and atraumatic.  Eyes: Pupils are equal, round, and reactive to light. Conjunctivae are normal.  Neck: Normal range of motion. Neck supple.  Cardiovascular: Normal rate and regular rhythm.  Pulmonary/Chest: Effort normal and breath sounds normal. No respiratory distress.  Abdominal: Soft. Bowel sounds are normal. There is no abdominal tenderness.  Musculoskeletal: Normal range of motion.        General: Edema present.     Comments: 2+ pedal edema   Lymphadenopathy:    She has no cervical adenopathy.  Neurological: She is alert and oriented to person, place, and time.  Skin:  Skin is warm and dry.  Psychiatric: Mood and affect normal.     Data Reviewed: I have personally reviewed following labs and imaging studies  CBC: Recent Labs  Lab 07/31/18 0516 08/03/18 0417  WBC 11.8* 12.0*  NEUTROABS 9.3* 9.6*  HGB 10.7* 11.3*  HCT 33.1* 35.8*  MCV 90.4 92.5  PLT 140* 235   Basic Metabolic Panel: Recent Labs  Lab 07/31/18 0516 08/01/18 0532 08/02/18 0558 08/03/18 0417 08/04/18 0442 08/05/18 0557  NA 139 140 141 141 139 142  K 3.1* 4.1 3.6 3.6 3.4* 3.6  CL 99 100 97* 95* 89* 87*  CO2 26 27 29 31  36* 40*  GLUCOSE 100* 172* 96 172* 209* 203*  BUN 57* 65* 70* 70* 73* 74*  CREATININE 3.25* 2.88* 2.77* 2.58* 2.52* 2.12*  CALCIUM 6.4* 7.3* 7.3* 7.3* 7.4* 7.7*  MG 1.9  --   --   --   --   --   PHOS  --   --   --   --   --  4.8*   GFR: Estimated Creatinine Clearance: 17.7 mL/min (A) (by C-G formula based on SCr of 2.12 mg/dL (H)). Liver Function Tests: Recent Labs  Lab 08/05/18 0557  ALBUMIN  2.5*   No results for input(s): LIPASE, AMYLASE in the last 168 hours. No results for input(s): AMMONIA in the last 168 hours. Coagulation Profile: Recent Labs  Lab 08/02/18 0918  INR 1.1   Cardiac Enzymes: Recent Labs  Lab 07/29/18 1725  TROPONINI 0.03*   BNP (last 3 results) No results for input(s): PROBNP in the last 8760 hours. HbA1C: No results for input(s): HGBA1C in the last 72 hours. CBG: Recent Labs  Lab 08/04/18 0726 08/04/18 1215 08/04/18 1643 08/04/18 2126 08/05/18 0816  GLUCAP 177* 224* 285* 238* 190*   Lipid Profile: No results for input(s): CHOL, HDL, LDLCALC, TRIG, CHOLHDL, LDLDIRECT in the last 72 hours. Thyroid Function Tests: No results for input(s): TSH, T4TOTAL, FREET4, T3FREE, THYROIDAB in the last 72 hours. Anemia Panel: No results for input(s): VITAMINB12, FOLATE, FERRITIN, TIBC, IRON, RETICCTPCT in the last 72 hours. Sepsis Labs: No results for input(s): PROCALCITON, LATICACIDVEN in the last 168 hours.  No  results found for this or any previous visit (from the past 240 hour(s)).       Radiology Studies: Dg Chest Port 1 View  Result Date: 08/03/2018 CLINICAL DATA:  Dyspnea EXAM: PORTABLE CHEST 1 VIEW COMPARISON:  07/29/2018 chest radiograph. FINDINGS: Stable cardiomediastinal silhouette with normal heart size. No pneumothorax. Small left pleural effusion, stable. Trace right pleural effusion, stable. No pulmonary edema. Left basilar lung opacity, similar. IMPRESSION: 1. Stable small left and trace right pleural effusions. 2. Stable left basilar lung opacity, favor atelectasis. Electronically Signed   By: Ilona Sorrel M.D.   On: 08/03/2018 15:13        Scheduled Meds: . feeding supplement  1 Container Oral BID BM  . feeding supplement (ENSURE ENLIVE)  237 mL Oral BID BM  . furosemide  80 mg Intravenous Q8H  . Gerhardt's butt cream   Topical BID  . heparin injection (subcutaneous)  5,000 Units Subcutaneous Q8H  . insulin aspart  0-9 Units Subcutaneous TID WC  . mouth rinse  15 mL Mouth Rinse BID  . metolazone  2.5 mg Oral Daily  . metoprolol tartrate  12.5 mg Oral BID  . multivitamin with minerals  1 tablet Oral Daily  . nystatin   Topical BID  . predniSONE  40 mg Oral Q breakfast  . sodium chloride flush  10-40 mL Intracatheter Q12H  . vancomycin  125 mg Oral QID   Continuous Infusions:    LOS: 12 days    Time spent: 25 minutes    Barb Merino, MD [facility] Pager [phone]  If 7PM-7AM, please contact night-coverage [email or web address] Password [facility] 08/05/2018, 10:39 AM

## 2018-08-05 NOTE — Care Management Important Message (Signed)
Important Message  Patient Details IM Letter given to Dessa Phi RN to present to the Patient Name: ARRIN ISHLER MRN: [id number] Date of Birth: September 19, 1933   Medicare Important Message Given:  Yes    Kerin Salen 08/05/2018, 10:58 AM

## 2018-08-05 NOTE — Progress Notes (Signed)
Patient ID: Shelby Walker, female   DOB: 09-Dec-1933, 85 y.o.   MRN: [id number] [facility] Progress Note   Assessment/ Plan:   1. Acute kidney Injury on chronic kidney disease stage III: Continues to show excellent response to diuresis with downtrending creatinine and weight.  She still appears to be about 9 kg over her stable state weight (reportedly 127 pounds prior to admission to the hospital =57.7 kg).  2.  Volume overload: Responding well to diuresis-based on labs/symptomatic assessment, will continue to decrease her diuretic doses based on labs/assessment of input/output. 3.  C. difficile infection/colitis: Continues to improve well on oral vancomycin 4.  Hypokalemia/metabolic alkalosis: Secondary to GI losses and now diuresis/contraction alkalosis.  Will adjust diuretic dose and give potassium anticipating further dialysis.  Will discontinue metolazone and give single dose of Diamox.  Subjective:   She reports that she is feeling somewhat better today and had a restful night of sleep.   Objective:   BP (!) 136/56 (BP Location: Right Arm)   Pulse 61   Temp 97.6 F (36.4 C) (Oral)   Resp 16   Ht 5' 1.5" (1.562 m)   Wt 66.1 kg   SpO2 97%   BMI 27.09 kg/m   Intake/Output Summary (Last 24 hours) at 08/05/2018 1114 Last data filed at 08/05/2018 0600 Gross per 24 hour  Intake 370 ml  Output 2750 ml  Net -2380 ml   Weight change: -1.078 kg  Physical Exam: Gen: Appears comfortable resting in bed CVS: Pulse regular rhythm, normal rate, S1 and S2 normal Resp: Diminished breath sounds over bases-poor inspiratory effort, no distinct rales Abd: Soft, obese, nontender Ext: 2+ left lower extremity edema, 1+ right lower extremity edema  Imaging: Dg Chest Port 1 View  Result Date: 08/03/2018 CLINICAL DATA:  Dyspnea EXAM: PORTABLE CHEST 1 VIEW COMPARISON:  07/29/2018 chest radiograph. FINDINGS: Stable cardiomediastinal silhouette with normal heart size. No pneumothorax. Small  left pleural effusion, stable. Trace right pleural effusion, stable. No pulmonary edema. Left basilar lung opacity, similar. IMPRESSION: 1. Stable small left and trace right pleural effusions. 2. Stable left basilar lung opacity, favor atelectasis. Electronically Signed   By: Ilona Sorrel M.D.   On: 08/03/2018 15:13    Labs: BMET Recent Labs  Lab 07/30/18 0526 07/31/18 9983 08/01/18 0532 08/02/18 0558 08/03/18 0417 08/04/18 0442 08/05/18 0557  NA 136 139 140 141 141 139 142  K 3.4* 3.1* 4.1 3.6 3.6 3.4* 3.6  CL 95* 99 100 97* 95* 89* 87*  CO2 29 26 27 29 31  36* 40*  GLUCOSE 222* 100* 172* 96 172* 209* 203*  BUN 65* 57* 65* 70* 70* 73* 74*  CREATININE 3.98* 3.25* 2.88* 2.77* 2.58* 2.52* 2.12*  CALCIUM 6.5* 6.4* 7.3* 7.3* 7.3* 7.4* 7.7*  PHOS  --   --   --   --   --   --  4.8*   CBC Recent Labs  Lab 07/31/18 0516 08/03/18 0417  WBC 11.8* 12.0*  NEUTROABS 9.3* 9.6*  HGB 10.7* 11.3*  HCT 33.1* 35.8*  MCV 90.4 92.5  PLT 140* 184    Medications:    . feeding supplement  1 Container Oral BID BM  . feeding supplement (ENSURE ENLIVE)  237 mL Oral BID BM  . furosemide  80 mg Intravenous Q8H  . Gerhardt's butt cream   Topical BID  . heparin injection (subcutaneous)  5,000 Units Subcutaneous Q8H  . insulin aspart  0-9 Units Subcutaneous TID WC  . mouth  rinse  15 mL Mouth Rinse BID  . metolazone  2.5 mg Oral Daily  . metoprolol tartrate  12.5 mg Oral BID  . multivitamin with minerals  1 tablet Oral Daily  . nystatin   Topical BID  . [START ON 08/06/2018] predniSONE  30 mg Oral Q breakfast  . sodium chloride flush  10-40 mL Intracatheter Q12H  . vancomycin  125 mg Oral QID   Elmarie Shiley, MD 08/05/2018, 11:14 AM

## 2018-08-06 ENCOUNTER — Inpatient Hospital Stay (HOSPITAL_COMMUNITY)
Admission: RE | Admit: 2018-08-06 | Discharge: 2018-08-23 | DRG: 945 | Disposition: A | Discharge status: Home Health | Payer: Medicare Other | Source: Intra-hospital | Attending: Physical Medicine & Rehabilitation | Admitting: Physical Medicine & Rehabilitation

## 2018-08-06 DIAGNOSIS — M069 Rheumatoid arthritis, unspecified: Secondary | ICD-10-CM | POA: Diagnosis present

## 2018-08-06 DIAGNOSIS — R739 Hyperglycemia, unspecified: Secondary | ICD-10-CM | POA: Diagnosis not present

## 2018-08-06 DIAGNOSIS — R579 Shock, unspecified: Secondary | ICD-10-CM

## 2018-08-06 DIAGNOSIS — M810 Age-related osteoporosis without current pathological fracture: Secondary | ICD-10-CM | POA: Diagnosis present

## 2018-08-06 DIAGNOSIS — R0609 Other forms of dyspnea: Secondary | ICD-10-CM

## 2018-08-06 DIAGNOSIS — R001 Bradycardia, unspecified: Secondary | ICD-10-CM | POA: Diagnosis not present

## 2018-08-06 DIAGNOSIS — R0789 Other chest pain: Secondary | ICD-10-CM

## 2018-08-06 DIAGNOSIS — M47816 Spondylosis without myelopathy or radiculopathy, lumbar region: Secondary | ICD-10-CM | POA: Diagnosis not present

## 2018-08-06 DIAGNOSIS — L409 Psoriasis, unspecified: Secondary | ICD-10-CM | POA: Diagnosis present

## 2018-08-06 DIAGNOSIS — E872 Acidosis: Secondary | ICD-10-CM | POA: Diagnosis not present

## 2018-08-06 DIAGNOSIS — I10 Essential (primary) hypertension: Secondary | ICD-10-CM

## 2018-08-06 DIAGNOSIS — Z7952 Long term (current) use of systemic steroids: Secondary | ICD-10-CM

## 2018-08-06 DIAGNOSIS — K219 Gastro-esophageal reflux disease without esophagitis: Secondary | ICD-10-CM | POA: Diagnosis present

## 2018-08-06 DIAGNOSIS — I480 Paroxysmal atrial fibrillation: Secondary | ICD-10-CM

## 2018-08-06 DIAGNOSIS — R0602 Shortness of breath: Secondary | ICD-10-CM

## 2018-08-06 DIAGNOSIS — I129 Hypertensive chronic kidney disease with stage 1 through stage 4 chronic kidney disease, or unspecified chronic kidney disease: Secondary | ICD-10-CM | POA: Diagnosis present

## 2018-08-06 DIAGNOSIS — M5136 Other intervertebral disc degeneration, lumbar region: Secondary | ICD-10-CM | POA: Diagnosis present

## 2018-08-06 DIAGNOSIS — I952 Hypotension due to drugs: Secondary | ICD-10-CM | POA: Diagnosis not present

## 2018-08-06 DIAGNOSIS — D62 Acute posthemorrhagic anemia: Secondary | ICD-10-CM

## 2018-08-06 DIAGNOSIS — E876 Hypokalemia: Secondary | ICD-10-CM | POA: Diagnosis not present

## 2018-08-06 DIAGNOSIS — T380X5A Adverse effect of glucocorticoids and synthetic analogues, initial encounter: Secondary | ICD-10-CM | POA: Diagnosis present

## 2018-08-06 DIAGNOSIS — E1122 Type 2 diabetes mellitus with diabetic chronic kidney disease: Secondary | ICD-10-CM

## 2018-08-06 DIAGNOSIS — E119 Type 2 diabetes mellitus without complications: Secondary | ICD-10-CM | POA: Diagnosis not present

## 2018-08-06 DIAGNOSIS — E46 Unspecified protein-calorie malnutrition: Secondary | ICD-10-CM | POA: Diagnosis not present

## 2018-08-06 DIAGNOSIS — R52 Pain, unspecified: Secondary | ICD-10-CM | POA: Diagnosis not present

## 2018-08-06 DIAGNOSIS — T447X5A Adverse effect of beta-adrenoreceptor antagonists, initial encounter: Secondary | ICD-10-CM | POA: Diagnosis not present

## 2018-08-06 DIAGNOSIS — Z8249 Family history of ischemic heart disease and other diseases of the circulatory system: Secondary | ICD-10-CM

## 2018-08-06 DIAGNOSIS — Z794 Long term (current) use of insulin: Secondary | ICD-10-CM

## 2018-08-06 DIAGNOSIS — E8809 Other disorders of plasma-protein metabolism, not elsewhere classified: Secondary | ICD-10-CM

## 2018-08-06 DIAGNOSIS — E785 Hyperlipidemia, unspecified: Secondary | ICD-10-CM | POA: Diagnosis present

## 2018-08-06 DIAGNOSIS — R197 Diarrhea, unspecified: Secondary | ICD-10-CM | POA: Diagnosis not present

## 2018-08-06 DIAGNOSIS — R5381 Other malaise: Principal | ICD-10-CM

## 2018-08-06 DIAGNOSIS — E782 Mixed hyperlipidemia: Secondary | ICD-10-CM

## 2018-08-06 DIAGNOSIS — N183 Chronic kidney disease, stage 3 (moderate): Secondary | ICD-10-CM | POA: Diagnosis present

## 2018-08-06 DIAGNOSIS — E1165 Type 2 diabetes mellitus with hyperglycemia: Secondary | ICD-10-CM | POA: Diagnosis present

## 2018-08-06 DIAGNOSIS — J90 Pleural effusion, not elsewhere classified: Secondary | ICD-10-CM | POA: Diagnosis not present

## 2018-08-06 DIAGNOSIS — I4891 Unspecified atrial fibrillation: Secondary | ICD-10-CM

## 2018-08-06 DIAGNOSIS — A0472 Enterocolitis due to Clostridium difficile, not specified as recurrent: Secondary | ICD-10-CM | POA: Diagnosis present

## 2018-08-06 DIAGNOSIS — N179 Acute kidney failure, unspecified: Secondary | ICD-10-CM | POA: Diagnosis present

## 2018-08-06 DIAGNOSIS — E873 Alkalosis: Secondary | ICD-10-CM | POA: Diagnosis present

## 2018-08-06 DIAGNOSIS — Z823 Family history of stroke: Secondary | ICD-10-CM

## 2018-08-06 DIAGNOSIS — Z7982 Long term (current) use of aspirin: Secondary | ICD-10-CM

## 2018-08-06 DIAGNOSIS — I739 Peripheral vascular disease, unspecified: Secondary | ICD-10-CM

## 2018-08-06 DIAGNOSIS — E8770 Fluid overload, unspecified: Secondary | ICD-10-CM | POA: Diagnosis not present

## 2018-08-06 DIAGNOSIS — R5383 Other fatigue: Secondary | ICD-10-CM | POA: Diagnosis present

## 2018-08-06 DIAGNOSIS — D696 Thrombocytopenia, unspecified: Secondary | ICD-10-CM | POA: Diagnosis present

## 2018-08-06 DIAGNOSIS — D72829 Elevated white blood cell count, unspecified: Secondary | ICD-10-CM

## 2018-08-06 DIAGNOSIS — Z833 Family history of diabetes mellitus: Secondary | ICD-10-CM

## 2018-08-06 DIAGNOSIS — M4316 Spondylolisthesis, lumbar region: Secondary | ICD-10-CM | POA: Diagnosis not present

## 2018-08-06 LAB — RENAL FUNCTION PANEL
Albumin: 2.4 g/dL — ABNORMAL LOW (ref 3.5–5.0)
Anion gap: 13 (ref 5–15)
BUN: 74 mg/dL — ABNORMAL HIGH (ref 8–23)
CO2: 40 mmol/L — ABNORMAL HIGH (ref 22–32)
Calcium: 7.7 mg/dL — ABNORMAL LOW (ref 8.9–10.3)
Chloride: 88 mmol/L — ABNORMAL LOW (ref 98–111)
Creatinine, Ser: 1.91 mg/dL — ABNORMAL HIGH (ref 0.44–1.00)
GFR calc Af Amer: 28 mL/min — ABNORMAL LOW (ref >60)
GFR calc non Af Amer: 24 mL/min — ABNORMAL LOW (ref >60)
Glucose, Bld: 213 mg/dL — ABNORMAL HIGH (ref 70–99)
Phosphorus: 3.8 mg/dL (ref 2.5–4.6)
Potassium: 3.6 mmol/L (ref 3.5–5.1)
Sodium: 141 mmol/L (ref 135–145)

## 2018-08-06 LAB — GLUCOSE, CAPILLARY
Glucose-Capillary: 182 mg/dL — ABNORMAL HIGH (ref 70–99)
Glucose-Capillary: 165 mg/dL — ABNORMAL HIGH (ref 70–99)
Glucose-Capillary: 275 mg/dL — ABNORMAL HIGH (ref 70–99)
Glucose-Capillary: 216 mg/dL — ABNORMAL HIGH (ref 70–99)

## 2018-08-06 MED ORDER — METOPROLOL TARTRATE 25 MG PO TABS
12.5000 mg | ORAL_TABLET | Freq: Two times a day (BID) | ORAL | Status: DC
  Administered 2018-08-06 – 2018-08-11 (×10): 12.5 mg via ORAL
  Filled 2018-08-06 (×10): qty 1

## 2018-08-06 MED ORDER — ACETAMINOPHEN 325 MG PO TABS
650.0000 mg | ORAL_TABLET | Freq: Four times a day (QID) | ORAL | Status: DC | PRN
  Administered 2018-08-09 – 2018-08-22 (×10): 650 mg via ORAL
  Filled 2018-08-06 (×9): qty 2

## 2018-08-06 MED ORDER — INSULIN ASPART 100 UNIT/ML ~~LOC~~ SOLN
0.0000 [IU] | Freq: Three times a day (TID) | SUBCUTANEOUS | Status: DC
  Administered 2018-08-06: 5 [IU] via SUBCUTANEOUS
  Administered 2018-08-07 (×2): 3 [IU] via SUBCUTANEOUS
  Administered 2018-08-07: 2 [IU] via SUBCUTANEOUS
  Administered 2018-08-08: 18:00:00 3 [IU] via SUBCUTANEOUS
  Administered 2018-08-08: 13:00:00 5 [IU] via SUBCUTANEOUS
  Administered 2018-08-08: 09:00:00 2 [IU] via SUBCUTANEOUS
  Administered 2018-08-09: 7 [IU] via SUBCUTANEOUS
  Administered 2018-08-09: 5 [IU] via SUBCUTANEOUS
  Administered 2018-08-09: 3 [IU] via SUBCUTANEOUS
  Administered 2018-08-10: 2 [IU] via SUBCUTANEOUS
  Administered 2018-08-10: 9 [IU] via SUBCUTANEOUS
  Administered 2018-08-10: 3 [IU] via SUBCUTANEOUS
  Administered 2018-08-11: 7 [IU] via SUBCUTANEOUS
  Administered 2018-08-11: 2 [IU] via SUBCUTANEOUS
  Administered 2018-08-11 – 2018-08-12 (×2): 3 [IU] via SUBCUTANEOUS
  Administered 2018-08-12: 09:00:00 2 [IU] via SUBCUTANEOUS
  Administered 2018-08-12: 13:00:00 1 [IU] via SUBCUTANEOUS
  Administered 2018-08-13: 2 [IU] via SUBCUTANEOUS
  Administered 2018-08-13 (×2): 1 [IU] via SUBCUTANEOUS
  Administered 2018-08-14 (×2): 2 [IU] via SUBCUTANEOUS
  Administered 2018-08-14: 9 [IU] via SUBCUTANEOUS
  Administered 2018-08-15: 2 [IU] via SUBCUTANEOUS
  Administered 2018-08-15: 10:00:00 7 [IU] via SUBCUTANEOUS
  Administered 2018-08-15: 13:00:00 2 [IU] via SUBCUTANEOUS
  Administered 2018-08-16: 3 [IU] via SUBCUTANEOUS
  Administered 2018-08-16: 18:00:00 7 [IU] via SUBCUTANEOUS
  Administered 2018-08-16 – 2018-08-17 (×2): 2 [IU] via SUBCUTANEOUS
  Administered 2018-08-17: 1 [IU] via SUBCUTANEOUS
  Administered 2018-08-17: 2 [IU] via SUBCUTANEOUS
  Administered 2018-08-18: 3 [IU] via SUBCUTANEOUS
  Administered 2018-08-18: 1 [IU] via SUBCUTANEOUS
  Administered 2018-08-18 – 2018-08-19 (×3): 3 [IU] via SUBCUTANEOUS
  Administered 2018-08-19: 1 [IU] via SUBCUTANEOUS
  Administered 2018-08-20: 3 [IU] via SUBCUTANEOUS
  Administered 2018-08-20: 1 [IU] via SUBCUTANEOUS
  Administered 2018-08-21: 2 [IU] via SUBCUTANEOUS
  Administered 2018-08-21 – 2018-08-22 (×3): 1 [IU] via SUBCUTANEOUS

## 2018-08-06 MED ORDER — METOPROLOL TARTRATE 25 MG PO TABS: 12.5000 mg | ORAL_TABLET | Freq: Two times a day (BID) | ORAL | 0 refills | Status: DC

## 2018-08-06 MED ORDER — APIXABAN 2.5 MG PO TABS: 2.5000 mg | ORAL_TABLET | Freq: Two times a day (BID) | ORAL | 0 refills | Status: DC

## 2018-08-06 MED ORDER — VANCOMYCIN 50 MG/ML ORAL SOLUTION: 125.0000 mg | Freq: Four times a day (QID) | ORAL | 0 refills | Status: DC

## 2018-08-06 MED ORDER — ATORVASTATIN CALCIUM 10 MG PO TABS
20.0000 mg | ORAL_TABLET | Freq: Every day | ORAL | Status: DC
  Administered 2018-08-06 – 2018-08-22 (×17): 20 mg via ORAL
  Filled 2018-08-06 (×18): qty 2

## 2018-08-06 MED ORDER — GERHARDT'S BUTT CREAM
TOPICAL_CREAM | Freq: Two times a day (BID) | CUTANEOUS | Status: DC
  Administered 2018-08-06 – 2018-08-23 (×32): via TOPICAL
  Filled 2018-08-06 (×2): qty 1

## 2018-08-06 MED ORDER — ACETAZOLAMIDE SODIUM 500 MG IJ SOLR
250.0000 mg | Freq: Once | INTRAMUSCULAR | Status: AC
  Administered 2018-08-06: 250 mg via INTRAVENOUS
  Filled 2018-08-06: qty 250

## 2018-08-06 MED ORDER — POLYVINYL ALCOHOL 1.4 % OP SOLN
1.0000 [drp] | OPHTHALMIC | Status: DC | PRN
  Administered 2018-08-11 – 2018-08-19 (×3): 1 [drp] via OPHTHALMIC
  Filled 2018-08-06: qty 15

## 2018-08-06 MED ORDER — VANCOMYCIN 50 MG/ML ORAL SOLUTION
125.0000 mg | Freq: Four times a day (QID) | ORAL | Status: AC
  Administered 2018-08-06 – 2018-08-07 (×7): 125 mg via ORAL
  Filled 2018-08-06 (×7): qty 2.5

## 2018-08-06 MED ORDER — ENSURE ENLIVE PO LIQD
237.0000 mL | Freq: Two times a day (BID) | ORAL | Status: DC
  Administered 2018-08-06 – 2018-08-22 (×21): 237 mL via ORAL

## 2018-08-06 MED ORDER — APIXABAN 2.5 MG PO TABS
2.5000 mg | ORAL_TABLET | Freq: Two times a day (BID) | ORAL | Status: DC
  Administered 2018-08-06 – 2018-08-23 (×34): 2.5 mg via ORAL
  Filled 2018-08-06 (×34): qty 1

## 2018-08-06 MED ORDER — FUROSEMIDE 80 MG PO TABS: 80.0000 mg | ORAL_TABLET | Freq: Every day | ORAL | 0 refills | Status: DC

## 2018-08-06 MED ORDER — ONDANSETRON HCL 4 MG PO TABS
4.0000 mg | ORAL_TABLET | Freq: Four times a day (QID) | ORAL | Status: DC | PRN
  Administered 2018-08-07 – 2018-08-09 (×3): 4 mg via ORAL
  Filled 2018-08-06 (×3): qty 1

## 2018-08-06 MED ORDER — ADULT MULTIVITAMIN W/MINERALS CH
1.0000 | ORAL_TABLET | Freq: Every day | ORAL | Status: DC
  Administered 2018-08-07 – 2018-08-23 (×17): 1 via ORAL
  Filled 2018-08-06 (×17): qty 1

## 2018-08-06 MED ORDER — NITROGLYCERIN 0.4 MG SL SUBL: 0.4000 mg | SUBLINGUAL_TABLET | SUBLINGUAL | Status: DC | PRN

## 2018-08-06 MED ORDER — SORBITOL 70 % SOLN: 30.0000 mL | Freq: Every day | Status: DC | PRN

## 2018-08-06 MED ORDER — ENSURE ENLIVE PO LIQD: 237.0000 mL | Freq: Two times a day (BID) | ORAL | 0 refills | Status: DC

## 2018-08-06 MED ORDER — ONDANSETRON HCL 4 MG/2ML IJ SOLN
4.0000 mg | Freq: Four times a day (QID) | INTRAMUSCULAR | Status: DC | PRN
  Filled 2018-08-06: qty 2

## 2018-08-06 MED ORDER — ACETAMINOPHEN 650 MG RE SUPP: 650.0000 mg | Freq: Four times a day (QID) | RECTAL | Status: DC | PRN

## 2018-08-06 MED ORDER — ALBUTEROL SULFATE (2.5 MG/3ML) 0.083% IN NEBU: 2.5000 mg | INHALATION_SOLUTION | RESPIRATORY_TRACT | Status: DC | PRN

## 2018-08-06 MED ORDER — BOOST / RESOURCE BREEZE PO LIQD CUSTOM
1.0000 | Freq: Two times a day (BID) | ORAL | Status: DC
  Administered 2018-08-06 – 2018-08-18 (×9): 1 via ORAL

## 2018-08-06 MED ORDER — FUROSEMIDE 40 MG PO TABS: 80.0000 mg | ORAL_TABLET | Freq: Every day | ORAL | Status: DC

## 2018-08-06 MED ORDER — APIXABAN 2.5 MG PO TABS
2.5000 mg | ORAL_TABLET | Freq: Two times a day (BID) | ORAL | Status: DC
  Administered 2018-08-06: 2.5 mg via ORAL
  Filled 2018-08-06: qty 1

## 2018-08-06 MED ORDER — PREDNISONE 10 MG PO TABS
5.0000 mg | ORAL_TABLET | Freq: Every day | ORAL | Status: DC
  Administered 2018-08-07 – 2018-08-23 (×17): 5 mg via ORAL
  Filled 2018-08-06 (×17): qty 1

## 2018-08-06 MED ORDER — OXYCODONE HCL 5 MG PO TABS
5.0000 mg | ORAL_TABLET | Freq: Four times a day (QID) | ORAL | Status: DC | PRN
  Administered 2018-08-07 – 2018-08-23 (×4): 5 mg via ORAL
  Filled 2018-08-06 (×5): qty 1

## 2018-08-06 MED ORDER — NYSTATIN 100000 UNIT/GM EX POWD
Freq: Two times a day (BID) | CUTANEOUS | Status: DC
  Administered 2018-08-06 – 2018-08-22 (×33): via TOPICAL
  Filled 2018-08-06: qty 15

## 2018-08-06 MED ORDER — INSULIN NPH (HUMAN) (ISOPHANE) 100 UNIT/ML ~~LOC~~ SUSP: 10.0000 [IU] | Freq: Every day | SUBCUTANEOUS | 11 refills | Status: DC

## 2018-08-06 MED ORDER — FUROSEMIDE 40 MG PO TABS
80.0000 mg | ORAL_TABLET | Freq: Every day | ORAL | Status: DC
  Administered 2018-08-07: 08:00:00 80 mg via ORAL
  Filled 2018-08-06: qty 2

## 2018-08-06 NOTE — Discharge Instructions (Signed)

## 2018-08-06 NOTE — Discharge Summary (Addendum)
Physician Discharge Summary  Shelby Walker:[id number] DOB: 08-12-1933 DOA: 07/24/2018  PCP: Reynold Bowen, MD  Admit date: 07/24/2018 Discharge date: 08/06/2018  Admitted From: Home  Disposition: CIR  Recommendations for Outpatient Follow-up and new medication changes:  1. Follow up with Dr.South in 7 days after discharge form CIR.  2. Please follow basic metabolic panel on 76/7/20.  3. Patient has been placed on apixaban and metoprolol for paroxxysmal atrial fibrillation.  4. Will decrease insulin lantus to 10 units qhs 5. Change metoprolol 25 mg bid to 12,5 mg po bid. 6. Discontinue diltiazem.   Home Health: na  Equipment/Devices: na   Discharge Condition: stable CODE STATUS: dnr   Diet recommendation: Heart healthy and diabetic prudent.   Brief/Interim Summary: 85 year old female who presented with nausea, vomiting and diarrhea. She does have significant past medical history for type 2 diabetes mellitus, hypertension, dyslipidemia, GERD and rheumatoid arthritis.  Reported 3 weeks of symptoms, she was diagnosed with infectious colitis as an outpatient and completed 7 days therapy with ciprofloxacin and metronidazole without improvement of her symptoms.  She had about 8 bowel movements per day, with decreased p.o. intake and diffuse abdominal pain.  On her initial physical examination her temperature was 100.5 F, heart rate 104, respiratory rate 16, blood pressure 99/53, oxygen saturation 93% on room air.  Her lungs had clear breath sounds bilaterally, heart S1-S2 present rhythmic and tachycardic, abdomen with moderate diffuse tenderness to palpation, no lower extremity edema.  Sodium 135, potassium 4.2, chloride 105, bicarb 19, glucose 155, BUN 23, creatinine 1.22, white count 15.3, hemoglobin 11.4, hematocrit 36.4, platelets 247.  SARS COVID-19 was negative, urine analysis negative for infection.  Tested positive for C. difficile.  Gastrointestinal panel was negative.  CT of the abdomen  had diffuse colitis without perforation or abscess formation.  Her chest radiograph was negative for infiltrates.  EKG 95 bpm, normal axis, normal intervals, sinus rhythm with normal conduction, no ST segment or T wave changes.  Patient was admitted to the hospital with a working diagnosis of sepsis due to C. difficile colitis.  1.  Septic shock due to C. difficile colitis (present on admission).  Patient was placed on enteral vancomycin for antibiotic therapy, intravenous fluids, vasopressors, and stress dose steroids.  Patient responded well to medical therapy, her hemodynamics improved and she was successfully transferred to the medical floor.  Patient will need 5 more days of oral vancomycin.   2.  Paroxysmal atrial fibrillation.  Patient developed atrial fibrillation with rapid ventricular response, she was placed on amiodarone, converting back to sinus rhythm.  Further work-up with echocardiography showed ejection fraction 60 to 65% of the left ventricle.  Her ChadsVasc score is 5, anticoagulation with apixaban has been started.  3.  Acute kidney injury on chronic kidney disease 3b with hypokalemia and hypocalcemia. Patient developed worsening kidney function with oliguria and volume overload.  Peak creatinine reached 3.99, patient was aggressively diuresed with improvement of kidney function, at discharge serum creatinine is 1.91, potassium 3.6, serum bicarbonate is 40.  Will recommend follow-up kidney function in 48 hours.  For now continue diuresis with furosemide 80 mg daily. Discharge calcium is 7,7. Peak weight 73.9 Kg discharge weight is 64.2 Kg.   4.  Type 2 diabetes mellitus.  Uncontrolled related to high-dose IV steroids, glucose has been now improving with a fasting glucose of 213 mg/dl.  She is tolerating p.o. diet adequately.  5.  Hypertension.  Continue blood pressure control with metoprolol.  6.  Rheumatoid arthritis.  Enbrel was held during hospitalization, at discharge will  resume this agent along with home dose of prednisone.  7.  Physical debility and deconditioning with nonspecific calorie protein malnutrition .  Patient had worsening functional status due to her acute illness, patient was seen by physical therapy, recommended to continue therapy at inpatient rehab.  Discharge Diagnoses:  Principal Problem:   Clostridium difficile colitis Active Problems:   Rheumatoid arthritis ([place])   Diabetes mellitus ([place])   GERD (gastroesophageal reflux disease)   Diverticulitis   Colitis presumed infectious   Colitis   Shock ([place])    Discharge Instructions   Allergies as of 08/06/2018      Reactions   Compazine [prochlorperazine Edisylate] Other (See Comments)   REACTION: " tongue swell and unable to swallow"   Sulfonamide Derivatives Other (See Comments)   REACTION: " broke out with fine itching bumps"      Medication List    STOP taking these medications   aspirin 325 MG EC tablet   diltiazem 60 MG tablet Commonly known as:  CARDIZEM   metoprolol succinate 25 MG 24 hr tablet Commonly known as:  TOPROL-XL     TAKE these medications   apixaban 2.5 MG Tabs tablet Commonly known as:  ELIQUIS Take 1 tablet (2.5 mg total) by mouth 2 (two) times daily for 30 days.   atorvastatin 20 MG tablet Commonly known as:  LIPITOR TAKE 1 TABLET BY MOUTH EVERY DAY What changed:  when to take this   bisacodyl 5 MG EC tablet Commonly known as:  DULCOLAX Take 5 mg by mouth daily as needed for moderate constipation.   Caltrate 600+D 600-400 MG-UNIT tablet Generic drug:  Calcium Carbonate-Vitamin D Take 1 tablet by mouth daily.   Centrum Silver Ultra Womens Tabs Take 1 tablet by mouth daily.   Cholecalciferol 25 MCG (1000 UT) capsule Take 1,000 Units by mouth daily.   Enbrel 50 MG/ML injection Generic drug:  etanercept Inject 50 mg into the skin once a week.   esomeprazole 40 MG capsule Commonly known as:  NEXIUM Take 40 mg by mouth daily at 12  noon.   feeding supplement (ENSURE ENLIVE) Liqd Take 237 mLs by mouth 2 (two) times daily between meals for 30 days.   Fish Oil 1200 MG Caps Take 1 capsule by mouth daily at 12 noon.   furosemide 80 MG tablet Commonly known as:  LASIX Take 1 tablet (80 mg total) by mouth daily for 30 days. Start taking on:  August 07, 2018   insulin NPH Human 100 UNIT/ML injection Commonly known as:  NOVOLIN N Inject 0.1 mLs (10 Units total) into the skin at bedtime. What changed:  how much to take   metoprolol tartrate 25 MG tablet Commonly known as:  LOPRESSOR Take 0.5 tablets (12.5 mg total) by mouth 2 (two) times daily for 30 days.   nitroGLYCERIN 0.4 MG SL tablet Commonly known as:  NITROSTAT 1 TABLET UNDER TONGUE AT ONSET OF CHEST PAIN YOU MAY REPEAT EVERY 5 MINUTES FOR UP TO 3 DOSES. What changed:  See the new instructions.   predniSONE 5 MG tablet Commonly known as:  DELTASONE Take 5 mg by mouth daily.   vancomycin 50 mg/mL  oral solution Commonly known as:  VANCOCIN Take 2.5 mLs (125 mg total) by mouth 4 (four) times daily for 5 days.       Allergies  Allergen Reactions  . Compazine [Prochlorperazine Edisylate] Other (See Comments)    REACTION: "  tongue swell and unable to swallow"  . Sulfonamide Derivatives Other (See Comments)    REACTION: " broke out with fine itching bumps"    Consultations:  Nephrology    Procedures/Studies: Dg Abd 1 View  Result Date: 07/26/2018 CLINICAL DATA:  Hypotension EXAM: ABDOMEN - 1 VIEW COMPARISON:  CT abdomen and pelvis Jul 24, 2018 FINDINGS: There is no appreciable bowel dilatation or air-fluid level to suggest bowel obstruction. No free air. Vascular calcification noted in the pelvis. IMPRESSION: No demonstrable bowel obstruction or free air. Electronically Signed   By: Lowella Grip III M.D.   On: 07/26/2018 10:23   Ct Head Wo Contrast  Result Date: 07/22/2018 CLINICAL DATA:  Status post fall Sunday, occipital pain EXAM: CT HEAD  WITHOUT CONTRAST TECHNIQUE: Contiguous axial images were obtained from the base of the skull through the vertex without intravenous contrast. COMPARISON:  None. FINDINGS: Brain: No evidence of acute infarction, hemorrhage, extra-axial collection, ventriculomegaly, or mass effect. There is a 11 mm hyperdense right anterior parafalcine mass likely reflecting a small meningioma. Generalized cerebral atrophy. Periventricular white matter low attenuation likely secondary to microangiopathy. Vascular: Cerebrovascular atherosclerotic calcifications are noted. Skull: Negative for fracture or focal lesion. Sinuses/Orbits: Visualized portions of the orbits are unremarkable. Visualized portions of the paranasal sinuses and mastoid air cells are unremarkable. Other: None. IMPRESSION: No acute intracranial pathology. Electronically Signed   By: Kathreen Devoid   On: 07/22/2018 15:32   Ct Abdomen Pelvis W Contrast  Result Date: 07/24/2018 CLINICAL DATA:  Fevers abdominal pain EXAM: CT ABDOMEN AND PELVIS WITH CONTRAST TECHNIQUE: Multidetector CT imaging of the abdomen and pelvis was performed using the standard protocol following bolus administration of intravenous contrast. CONTRAST:  158mL OMNIPAQUE IOHEXOL 300 MG/ML  SOLN COMPARISON:  07/09/2018 FINDINGS: Lower chest: No acute abnormality. Hepatobiliary: No focal liver abnormality is seen. No gallstones, gallbladder wall thickening, or biliary dilatation. Pancreas: Unremarkable. No pancreatic ductal dilatation or surrounding inflammatory changes. Spleen: Normal in size without focal abnormality. Adrenals/Urinary Tract: Adrenal glands are within normal limits bilaterally. Kidneys demonstrate no renal calculi or urinary tract obstructive changes. Stable renal cysts are noted bilaterally. Bladder is well distended. Stomach/Bowel: Colon demonstrates diffuse diverticular change throughout the sigmoid colon. Mucosal hyperemia and mild pericolonic inflammatory changes are now seen  diffusely throughout the colon increased from the prior exam consistent with a more generalized likely infectious colitis. Small bowel and stomach are within normal limits with the exception of a small sliding-type hiatal hernia. No free air is seen. Vascular/Lymphatic: Aortic atherosclerosis. No enlarged abdominal or pelvic lymph nodes. Reproductive: Uterus and bilateral adnexa are unremarkable. Other: No abdominal wall hernia or abnormality. No abdominopelvic ascites. Musculoskeletal: Degenerative changes of lumbar spine are noted. IMPRESSION: Changes consistent with diffuse colitis without perforation or abscess formation. This has advanced significantly when compared with the prior exam. Electronically Signed   By: Inez Catalina M.D.   On: 07/24/2018 15:29   Ct Abdomen Pelvis W Contrast  Result Date: 07/09/2018 CLINICAL DATA:  Low abdominal cramping, suspect diverticulitis EXAM: CT ABDOMEN AND PELVIS WITH CONTRAST TECHNIQUE: Multidetector CT imaging of the abdomen and pelvis was performed using the standard protocol following bolus administration of intravenous contrast. CONTRAST:  47mL OMNIPAQUE IOHEXOL 300 MG/ML  SOLN COMPARISON:  None. FINDINGS: Lower chest: No acute abnormality. Hepatobiliary: No focal liver abnormality is seen. No gallstones, gallbladder wall thickening, or biliary dilatation. Pancreas: Unremarkable. No pancreatic ductal dilatation or surrounding inflammatory changes. Spleen: Normal in size without focal abnormality. Adrenals/Urinary Tract: Adrenal  glands are unremarkable. Kidneys are normal, without renal calculi, focal lesion, or hydronephrosis. Bladder is unremarkable. Stomach/Bowel: Stomach is within normal limits. Appendix appears normal. There is severe sigmoid diverticulosis with wall thickening and fat stranding about the mid to distal sigmoid. Large burden of stool in the colon. Vascular/Lymphatic: Mixed calcific atherosclerosis. No enlarged abdominal or pelvic lymph nodes.  Reproductive: No mass or other abnormality. Other: No abdominal wall hernia or abnormality. No abdominopelvic ascites. Musculoskeletal: No acute or significant osseous findings. IMPRESSION: There is severe sigmoid diverticulosis with wall thickening and fat stranding about the mid to distal sigmoid. No evidence of perforation or abscess. Electronically Signed   By: Eddie Candle M.D.   On: 07/09/2018 10:29   US Renal  Result Date: 07/30/2018 CLINICAL DATA:  Renal failure. EXAM: RENAL / URINARY TRACT ULTRASOUND COMPLETE COMPARISON:  CT dated 07/24/2018 FINDINGS: Right Kidney: Renal measurements: 10.8 x 4.3 x 4.5 cm = volume: 111 mL . Echogenicity within normal limits. No mass or hydronephrosis visualized. A 1.9 cm cyst is noted Left Kidney: Renal measurements: 11 x 5.6 x 4.6 cm = volume: 149.9 mL. Echogenicity within normal limits. No mass or hydronephrosis visualized. A 1.3 cm cyst is noted. Bladder: Appears normal for degree of bladder distention. New mild-to-moderate abdominal ascites. IMPRESSION: 1. No acute sonographic abnormality detected. There is no hydronephrosis. 2. New mild-to-moderate abdominal ascites. Electronically Signed   By: Constance Holster M.D.   On: 07/30/2018 15:19   Dg Chest Port 1 View  Result Date: 08/03/2018 CLINICAL DATA:  Dyspnea EXAM: PORTABLE CHEST 1 VIEW COMPARISON:  07/29/2018 chest radiograph. FINDINGS: Stable cardiomediastinal silhouette with normal heart size. No pneumothorax. Small left pleural effusion, stable. Trace right pleural effusion, stable. No pulmonary edema. Left basilar lung opacity, similar. IMPRESSION: 1. Stable small left and trace right pleural effusions. 2. Stable left basilar lung opacity, favor atelectasis. Electronically Signed   By: Ilona Sorrel M.D.   On: 08/03/2018 15:13   Dg Chest Port 1 View  Result Date: 07/29/2018 CLINICAL DATA:  Cough and vomiting EXAM: PORTABLE CHEST 1 VIEW COMPARISON:  07/29/2018 FINDINGS: Cardiac shadow is stable. Left  jugular central line is again noted at the cavoatrial junction. The lungs are well aerated bilaterally. Small pleural effusions are seen bilaterally with left basilar atelectasis. No other focal abnormality is noted. IMPRESSION: Small effusions with left basilar atelectasis. No new focal abnormality is noted. Electronically Signed   By: Inez Catalina M.D.   On: 07/29/2018 19:32   Dg Chest Port 1 View  Result Date: 07/29/2018 CLINICAL DATA:  Cough.  Respiratory failure. EXAM: PORTABLE CHEST 1 VIEW COMPARISON:  Chest x-ray dated 07/26/2018 FINDINGS: The heart size is enlarged. There is a growing left-sided pleural effusion. The left IJ catheter is well position. No pneumothorax. There is an increasing retrocardiac opacity. There is no acute osseous abnormality. IMPRESSION: 1. Increasing left basilar opacity which may represent a combination of a small left-sided pleural effusion and/or atelectasis/infiltrate. 2. Mild cardiac enlargement. 3. Stable positioning of the left IJ line. Electronically Signed   By: Constance Holster M.D.   On: 07/29/2018 02:43   Dg Chest Port 1 View  Result Date: 07/26/2018 CLINICAL DATA:  Central line placement. EXAM: PORTABLE CHEST 1 VIEW COMPARISON:  07/26/2018 at 0958 hours FINDINGS: A left jugular catheter has been placed and terminates near the superior cavoatrial junction. The cardiomediastinal silhouette is unchanged with normal heart size. No airspace consolidation, edema, sizable pleural effusion, or pneumothorax is identified. An old right rib  fracture is noted. IMPRESSION: Left jugular catheter placement as above. No evidence of pneumothorax or acute cardiopulmonary process. Electronically Signed   By: Logan Bores M.D.   On: 07/26/2018 13:20   Dg Chest Port 1 View  Result Date: 07/26/2018 CLINICAL DATA:  Hypotension EXAM: PORTABLE CHEST 1 VIEW COMPARISON:  Jul 24, 2018 FINDINGS: No edema or consolidation. Heart size and pulmonary vascularity are normal. No adenopathy.  No bone lesions. IMPRESSION: No edema or consolidation.  Stable cardiac silhouette. Electronically Signed   By: Lowella Grip III M.D.   On: 07/26/2018 10:22   Dg Chest Portable 1 View  Result Date: 07/24/2018 CLINICAL DATA:  Chest pain EXAM: PORTABLE CHEST 1 VIEW COMPARISON:  01/20/2007 FINDINGS: Cardiac shadow is within normal limits. The lungs are well aerated bilaterally. No focal infiltrate or sizable effusion is seen. No acute bony abnormality is noted. IMPRESSION: No active disease. Electronically Signed   By: Inez Catalina M.D.   On: 07/24/2018 11:52   Dg Hip Unilat W Or Wo Pelvis 2-3 Views Left  Result Date: 07/24/2018 CLINICAL DATA:  Left hip pain after fall 4 days ago. EXAM: DG HIP (WITH OR WITHOUT PELVIS) 2-3V LEFT COMPARISON:  None. FINDINGS: There is no evidence of hip fracture or dislocation. There is no evidence of arthropathy or other focal bone abnormality. IMPRESSION: Negative. Electronically Signed   By: Marijo Conception M.D.   On: 07/24/2018 11:55   [place] Ekg Site Rite  Result Date: 07/26/2018 If Site Rite image not attached, placement could not be confirmed due to current cardiac rhythm.  US Abdomen Limited Ruq  Result Date: 08/02/2018 CLINICAL DATA:  Anasarca EXAM: ULTRASOUND ABDOMEN LIMITED RIGHT UPPER QUADRANT COMPARISON:  CT 07/24/2018 FINDINGS: Gallbladder: Gallbladder is distended. Sludge seen within the gallbladder. There Is mild gallbladder wall thickening, 3-4 mm. No visible stones. Common bile duct: Diameter: Normal caliber, 6 mm Liver: No focal lesion identified. Within normal limits in parenchymal echogenicity. Portal vein is patent on color Doppler imaging with normal direction of blood flow towards the liver. Perihepatic ascites and ascites noted around the gallbladder. IMPRESSION: Distended gallbladder with sludge and mild gallbladder wall thickening. Consider further evaluation with nuclear medicine hepatobiliary scan if there is concern of cystic duct patency.  Perihepatic ascites. Electronically Signed   By: Rolm Baptise M.D.   On: 08/02/2018 17:04      Procedures:   Subjective: Patient is feeling better, her stools are now formed, continue to have back pain and severe deconditioning. No nausea or vomiting, her abdominal pain has improved.   Discharge Exam: Vitals:   08/06/18 0644 08/06/18 0815  BP: (!) 120/45   Pulse: 72   Resp: 18   Temp: 98.1 F (36.7 C)   SpO2: 96% 95%   Vitals:   08/05/18 0547 08/05/18 2130 08/06/18 0644 08/06/18 0815  BP: (!) 136/56 (!) 116/55 (!) 120/45   Pulse: 61 66 72   Resp: 16 16 18    Temp: 97.6 F (36.4 C) 98.2 F (36.8 C) 98.1 F (36.7 C)   TempSrc: Oral Oral Oral   SpO2: 97% 99% 96% 95%  Weight:   64.2 kg   Height:        General: Not in pain or dyspnea, deconditioned  Neurology: Awake and alert, non focal  E ENT: mild pallor, no icterus, oral mucosa moist Cardiovascular: No JVD. S1-S2 present, rhythmic, no gallops, rubs, or murmurs. ++ pitting lower extremity edema. Pulmonary: positive breath sounds bilaterally, adequate air movement, no wheezing,  rhonchi or rales. Gastrointestinal. Abdomen fwith no organomegaly, non tender, no rebound or guarding Skin. No rashes Musculoskeletal: no joint deformities   The results of significant diagnostics from this hospitalization (including imaging, microbiology, ancillary and laboratory) are listed below for reference.     Microbiology: No results found for this or any previous visit (from the past 240 hour(s)).   Labs: BNP (last 3 results) No results for input(s): BNP in the last 8760 hours. Basic Metabolic Panel: Recent Labs  Lab 07/31/18 0516  08/02/18 0558 08/03/18 0417 08/04/18 0442 08/05/18 0557 08/06/18 0551  NA 139   < > 141 141 139 142 141  K 3.1*   < > 3.6 3.6 3.4* 3.6 3.6  CL 99   < > 97* 95* 89* 87* 88*  CO2 26   < > 29 31 36* 40* 40*  GLUCOSE 100*   < > 96 172* 209* 203* 213*  BUN 57*   < > 70* 70* 73* 74* 74*  CREATININE  3.25*   < > 2.77* 2.58* 2.52* 2.12* 1.91*  CALCIUM 6.4*   < > 7.3* 7.3* 7.4* 7.7* 7.7*  MG 1.9  --   --   --   --   --   --   PHOS  --   --   --   --   --  4.8* 3.8   < > = values in this interval not displayed.   Liver Function Tests: Recent Labs  Lab 08/05/18 0557 08/06/18 0551  ALBUMIN 2.5* 2.4*   No results for input(s): LIPASE, AMYLASE in the last 168 hours. No results for input(s): AMMONIA in the last 168 hours. CBC: Recent Labs  Lab 07/31/18 0516 08/03/18 0417  WBC 11.8* 12.0*  NEUTROABS 9.3* 9.6*  HGB 10.7* 11.3*  HCT 33.1* 35.8*  MCV 90.4 92.5  PLT 140* 184   Cardiac Enzymes: No results for input(s): CKTOTAL, CKMB, CKMBINDEX, TROPONINI in the last 168 hours. BNP: Invalid input(s): POCBNP CBG: Recent Labs  Lab 08/05/18 0816 08/05/18 1157 08/05/18 1647 08/05/18 2131 08/06/18 0757  GLUCAP 190* 264* 418* 327* 182*   D-Dimer No results for input(s): DDIMER in the last 72 hours. Hgb A1c No results for input(s): HGBA1C in the last 72 hours. Lipid Profile No results for input(s): CHOL, HDL, LDLCALC, TRIG, CHOLHDL, LDLDIRECT in the last 72 hours. Thyroid function studies No results for input(s): TSH, T4TOTAL, T3FREE, THYROIDAB in the last 72 hours.  Invalid input(s): FREET3 Anemia work up No results for input(s): VITAMINB12, FOLATE, FERRITIN, TIBC, IRON, RETICCTPCT in the last 72 hours. Urinalysis    Component Value Date/Time   COLORURINE YELLOW 07/31/2018 0516   APPEARANCEUR HAZY (A) 07/31/2018 0516   LABSPEC 1.011 07/31/2018 0516   PHURINE 5.0 07/31/2018 0516   GLUCOSEU NEGATIVE 07/31/2018 0516   HGBUR SMALL (A) 07/31/2018 0516   BILIRUBINUR NEGATIVE 07/31/2018 0516   KETONESUR NEGATIVE 07/31/2018 0516   PROTEINUR NEGATIVE 07/31/2018 0516   NITRITE NEGATIVE 07/31/2018 0516   LEUKOCYTESUR NEGATIVE 07/31/2018 0516   Sepsis Labs Invalid input(s): PROCALCITONIN,  WBC,  LACTICIDVEN Microbiology No results found for this or any previous visit (from  the past 240 hour(s)).   Time coordinating discharge: 45 minutes  SIGNED:   Tawni Millers, MD  [facility] 08/06/2018, 9:15 AM

## 2018-08-06 NOTE — H&P (Signed)
Physical Medicine and Rehabilitation Admission H&P    Chief Complaint  Patient presents with  . Abdominal Cramps  Chief complaint: Weakness HPI: Shelby Walker is an 85 year old right-handed female with history of diabetes mellitus, hypertension, rheumatoid arthritis maintained on Enbrel as well as prednisone.  Per chart review and patient, patient lives with daughter and son-in-law.  Independent prior to admission.  Two-level home with bedroom on main level.  Presented 07/24/2018 with nausea, vomiting, diarrhea.  She was recently seen at urgent care diagnosed with infectious colitis and completed 7-day course of Cipro and Flagyl without improvement.  She was noted to have SIRS on evaluation in the ED.  Noted fever of 100.5, heart rate 104, WBC 15,300, BUN 23, creatinine 1.22.  Urinalysis negative nitrite, COVID negative, troponin negative.  Chest x-ray unremarkable.  CT abdomen pelvis noted diffuse colitis without perforation or abscess.  C. difficile specimen was positive and maintained on vancomycin with contact precautions.  Hospital course complicated by PAF, placed on amiodarone converted back to sinus rhythm.  Echocardiogram with ejection fraction of 65%.  Eliquis was initiated for PAF as well as Lopressor.  Renal services consulted 07/30/2018 for elevated creatinine 3.98 from baseline 1.22.  Renal ultrasound showed no hydronephrosis.  There was some new mild to moderate abdominal ascites.  Patient responded well to Lasix therapy and latest creatinine 1.91.  Therapy evaluations completed and patient was admitted for a comprehensive rehab program.  Review of Systems  Constitutional: Positive for malaise/fatigue.  HENT: Negative for hearing loss.   Eyes: Negative for blurred vision and double vision.  Respiratory: Negative for cough and shortness of breath.   Cardiovascular: Negative for chest pain.  Gastrointestinal: Positive for abdominal pain, diarrhea, nausea and vomiting. Negative for blood  in stool.  Genitourinary: Negative for dysuria, flank pain and hematuria.  Musculoskeletal: Positive for joint pain and myalgias.  Skin: Negative for rash.  Neurological: Negative for seizures.  All other systems reviewed and are negative.  Past Medical History:  Diagnosis Date  . Arthritis, rheumatoid ([place])   . Bruit    Carotid Doppler showed no significant abnormality     9per patient)  . Chest pain, unspecified    Nuclear, May, 2008, no scar or ischemia  . Diabetes mellitus   . Dyslipidemia   . Ejection fraction    EF 55-60%, echo, February, 2011  . GERD (gastroesophageal reflux disease)   . Mitral regurgitation April 21, 2009   mild,  echo, February, 2011  . Osteoporosis   . Palpitations    possible very brief atrial fibrillation on monitor and possible reentrant tachycardia  . Psoriasis   . Rheumatoid arthritis [facility])    Past Surgical History:  Procedure Laterality Date  . FRACTURE SURGERY     Family History  Problem Relation Age of Onset  . Heart failure Father   . Heart attack Father   . Diabetes Father   . Heart attack Mother   . Heart failure Mother   . Diabetes Mother   . Stroke Sister    Social History:  reports that she has never smoked. She has never used smokeless tobacco. She reports that she does not drink alcohol or use drugs. Allergies:  Allergies  Allergen Reactions  . Compazine [Prochlorperazine Edisylate] Other (See Comments)    REACTION: " tongue swell and unable to swallow"  . Sulfonamide Derivatives Other (See Comments)    REACTION: " broke out with fine itching bumps"   Medications Prior to Admission  Medication Sig Dispense Refill  . aspirin 325 MG EC tablet Take 325 mg by mouth at bedtime.    Marland Kitchen atorvastatin (LIPITOR) 20 MG tablet TAKE 1 TABLET BY MOUTH EVERY DAY (Patient taking differently: Take 20 mg by mouth at bedtime. ) 90 tablet 3  . bisacodyl (DULCOLAX) 5 MG EC tablet Take 5 mg by mouth daily as needed for moderate constipation.     . Calcium Carbonate-Vitamin D (CALTRATE 600+D) 600-400 MG-UNIT per tablet Take 1 tablet by mouth daily.      . Cholecalciferol 1000 units capsule Take 1,000 Units by mouth daily.     Marland Kitchen diltiazem (CARDIZEM) 60 MG tablet Take 60 mg by mouth at bedtime.    Marland Kitchen esomeprazole (NEXIUM) 40 MG capsule Take 40 mg by mouth daily at 12 noon.    . etanercept (ENBREL) 50 MG/ML injection Inject 50 mg into the skin once a week.    . metoprolol succinate (TOPROL-XL) 25 MG 24 hr tablet Take 25 mg by mouth daily.      . Multiple Vitamins-Minerals (CENTRUM SILVER ULTRA WOMENS) TABS Take 1 tablet by mouth daily.     . nitroGLYCERIN (NITROSTAT) 0.4 MG SL tablet 1 TABLET UNDER TONGUE AT ONSET OF CHEST PAIN YOU MAY REPEAT EVERY 5 MINUTES FOR UP TO 3 DOSES. (Patient taking differently: Place 0.4 mg under the tongue See admin instructions. 1 TABLET UNDER TONGUE AT ONSET OF CHEST PAIN YOU MAY REPEAT EVERY 5 MINUTES FOR UP TO 3 DOSES.) 25 tablet 3  . Omega-3 Fatty Acids (FISH OIL) 1200 MG CAPS Take 1 capsule by mouth daily at 12 noon.    . predniSONE (DELTASONE) 5 MG tablet Take 5 mg by mouth daily.  3  . [DISCONTINUED] insulin NPH Human (NOVOLIN N) 100 UNIT/ML injection Inject 20 Units into the skin at bedtime.      Drug Regimen Review  Drug regimen was reviewed and remains appropriate with no significant issues identified  Home: Home Living Family/patient expects to be discharged to:: Private residence Living Arrangements: Spouse/significant other Available Help at Discharge: Family, Friend(s)(dtr and son law) Type of Home: House Home Access: Other (comment) Home Layout: Able to live on main level with bedroom/bathroom, Laundry or work area in Water quality scientist to basement) Bathroom Shower/Tub: Tub/shower unit, Walk-in shower(both but mostly uses walk in) [employer]: Standard Home Equipment: Environmental consultant - 2 wheels, [employer] - single point   Functional History: Prior Function Level of Independence: Independent  Comments: was not going to grocery store due to [place] pandemic; family providing meal  Functional Status:  Mobility: Bed Mobility Overal bed mobility: Needs Assistance Bed Mobility: Supine to Sit, Rolling Rolling: Min assist Sidelying to sit: Mod assist Supine to sit: Max assist, +2 for physical assistance Sit to supine: Max assist, +2 for physical assistance General bed mobility comments: OOB in recliner  Transfers Overall transfer level: Needs assistance Equipment used: None, Rolling walker (2 wheeled) Transfers: Sit to/from Stand, [employer] Transfers Sit to Stand: Mod assist Stand pivot transfers: Mod assist General transfer comment: stand pivot sit 1/4 turn to [facility] with increased time to power up and 25% VC's on turn completion.   Ambulation/Gait Ambulation/Gait assistance: Mod assist, Max assist, +2 physical assistance, +2 safety/equipment Gait Distance (Feet): 8 Feet Assistive device: Rolling walker (2 wheeled) Gait Pattern/deviations: Step-to pattern, Decreased stance time - left General Gait Details: + 2 side by side assist a limited distance due to increased c/o dizziness and L knee buckled x 2.  Recliner following.  HIGH FALL RISK. Gait velocity: decreased     ADL: ADL Overall ADL's : Needs assistance/impaired Eating/Feeding: Set up, Sitting, Bed level Grooming: Wash/dry face, Oral care, Sitting Grooming Details (indicate cue type and reason): to don chapstick up in chair Upper Body Bathing: Moderate assistance, Sitting Lower Body Bathing: Sit to/from stand, Cueing for sequencing, Cueing for safety, Maximal assistance Upper Body Dressing : Set up, Sitting Lower Body Dressing: Maximal assistance, Sit to/from stand, Cueing for sequencing, Cueing for safety Toilet Transfer: Moderate assistance, RW, Stand-pivot, Comfort height toilet Toilet Transfer Details (indicate cue type and reason): simulated with recliner Toileting- Clothing Manipulation and Hygiene: Maximal  assistance, Sit to/from stand, Cueing for sequencing, Cueing for safety Toileting - Clothing Manipulation Details (indicate cue type and reason): incontinent of stool; on foley Tub/ Shower Transfer: Total assistance Functional mobility during ADLs: Maximal assistance, +2 for physical assistance, +2 for safety/equipment(stand pivot t/f) General ADL Comments: Pt needed much encouragement.  Pt did get OOB, to BSc then to chair with OT  Cognition: Cognition Overall Cognitive Status: Within Functional Limits for tasks assessed Orientation Level: Oriented X4 Cognition Arousal/Alertness: Awake/alert Behavior During Therapy: WFL for tasks assessed/performed Overall Cognitive Status: Within Functional Limits for tasks assessed General Comments: AxO x 3  Physical Exam: Blood pressure (!) 120/45, pulse 72, temperature 98.1 F (36.7 C), temperature source Oral, resp. rate 18, height 5' 1.5" (1.562 m), weight 64.2 kg, SpO2 95 %. Physical Exam  Vitals reviewed. Constitutional: She appears well-developed and well-nourished.  HENT:  Head: Normocephalic and atraumatic.  Eyes: Right eye exhibits no discharge. Left eye exhibits no discharge.  Keeps eyes closed  Respiratory: Effort normal. No respiratory distress.  + [place]  GI: She exhibits no distension.  Musculoskeletal:     Comments: No edema or tenderness in extremities Ulnar deviation at MCP joints bilaterally  Neurological: She is alert.  Alert female no acute distress.   Follows commands.   HOH Motor: Right upper extremity: 5/5 proximal distal Right lower extremity: 4-4+/5 proximal distal Left upper extremity: 4/5 proximal distal Left lower extremity: 2+/5 proximal to distal  Skin: Skin is warm and dry.  Psychiatric: Her affect is blunt. Her speech is delayed. She is slowed.    Results for orders placed or performed during the hospital encounter of 07/24/18 (from the past 48 hour(s))  Glucose, capillary     Status: Abnormal   Collection  Time: 08/04/18 12:15 PM  Result Value Ref Range   Glucose-Capillary 224 (H) 70 - 99 mg/dL  Glucose, capillary     Status: Abnormal   Collection Time: 08/04/18  4:43 PM  Result Value Ref Range   Glucose-Capillary 285 (H) 70 - 99 mg/dL  Glucose, capillary     Status: Abnormal   Collection Time: 08/04/18  9:26 PM  Result Value Ref Range   Glucose-Capillary 238 (H) 70 - 99 mg/dL  Renal function panel     Status: Abnormal   Collection Time: 08/05/18  5:57 AM  Result Value Ref Range   Sodium 142 135 - 145 mmol/L   Potassium 3.6 3.5 - 5.1 mmol/L   Chloride 87 (L) 98 - 111 mmol/L   CO2 40 (H) 22 - 32 mmol/L   Glucose, Bld 203 (H) 70 - 99 mg/dL   BUN 74 (H) 8 - 23 mg/dL   Creatinine, Ser 2.12 (H) 0.44 - 1.00 mg/dL   Calcium 7.7 (L) 8.9 - 10.3 mg/dL   Phosphorus 4.8 (H) 2.5 - 4.6 mg/dL   Albumin 2.5 (L)  3.5 - 5.0 g/dL   GFR calc non Af Amer 21 (L) >60 mL/min   GFR calc Af Amer 24 (L) >60 mL/min   Anion gap 15 5 - 15    Comment: Performed at [facility], [place] [address], [place], [place] [address]  Glucose, capillary     Status: Abnormal   Collection Time: 08/05/18  8:16 AM  Result Value Ref Range   Glucose-Capillary 190 (H) 70 - 99 mg/dL  Glucose, capillary     Status: Abnormal   Collection Time: 08/05/18 11:57 AM  Result Value Ref Range   Glucose-Capillary 264 (H) 70 - 99 mg/dL  Glucose, capillary     Status: Abnormal   Collection Time: 08/05/18  4:47 PM  Result Value Ref Range   Glucose-Capillary 418 (H) 70 - 99 mg/dL  Glucose, capillary     Status: Abnormal   Collection Time: 08/05/18  9:31 PM  Result Value Ref Range   Glucose-Capillary 327 (H) 70 - 99 mg/dL  Renal function panel     Status: Abnormal   Collection Time: 08/06/18  5:51 AM  Result Value Ref Range   Sodium 141 135 - 145 mmol/L   Potassium 3.6 3.5 - 5.1 mmol/L   Chloride 88 (L) 98 - 111 mmol/L   CO2 40 (H) 22 - 32 mmol/L   Glucose, Bld 213 (H) 70 - 99 mg/dL   BUN 74 (H) 8 - 23 mg/dL    Creatinine, Ser 1.91 (H) 0.44 - 1.00 mg/dL   Calcium 7.7 (L) 8.9 - 10.3 mg/dL   Phosphorus 3.8 2.5 - 4.6 mg/dL   Albumin 2.4 (L) 3.5 - 5.0 g/dL   GFR calc non Af Amer 24 (L) >60 mL/min   GFR calc Af Amer 28 (L) >60 mL/min   Anion gap 13 5 - 15    Comment: Performed at [facility], [place] [address] [place],  [address]  Glucose, capillary     Status: Abnormal   Collection Time: 08/06/18  7:57 AM  Result Value Ref Range   Glucose-Capillary 182 (H) 70 - 99 mg/dL   No results found.     Medical Problem List and Plan: 1.  Debility secondary to septic shock due to C. difficile colitis.  Complete course of vancomycin x4 more days.  Contact precautions  Admit to CIR 2.  Antithrombotics: -DVT/anticoagulation: Eliquis  -antiplatelet therapy: N/A 3. Pain Management: Oxycodone as needed 4. Mood: Provide emotional support  -antipsychotic agents: N/A 5. Neuropsych: This patient is ?  Fully capable of making decisions on her own behalf. 6. Skin/Wound Care: Routine skin checks 7. Fluids/Electrolytes/Nutrition: Routine in and outs  Follow-up BMP tomorrow a.m.  8.  New onset PAF.  Lopressor 12.5 mg twice daily.  Continue Eliquis.  Cardiac rate controlled  Monitor with increased mobility 9.  Diabetes mellitus.  Hemoglobin A1c 8.8.  Sliding scale insulin. 10.  Rheumatoid arthritis.  Patient on Enbrel 50 mg injection weekly and can be resumed as outpatient.  Continue chronic prednisone 5 mg daily 11.  AKI.  Responding well to Lasix and plan 80 mg daily x30 days.  Follow-up renal services  BMP ordered for tomorrow a.m.  Lavon Paganini Angiulli, PA-C 08/06/2018

## 2018-08-06 NOTE — Progress Notes (Signed)
Patient arrived on rehab unit today, she is very pleasant , no complains of pain just nausea and vomiting when she arrived,she was informed about rehab process,patient safety plan and patient booklet.

## 2018-08-06 NOTE — Progress Notes (Signed)
Patient ID: Shelby Walker, female   DOB: Nov 13, 1933, 85 y.o.   MRN: [id number] [facility] Progress Note   Assessment/ Plan:   1. Acute kidney Injury on chronic kidney disease stage III: Continues to show excellent response to diuresis with downtrending creatinine and weight.  With improving renal function and volume status, agreeable with switch to oral furosemide beginning tomorrow. 2.  Volume overload: Responding well to diuresis-based on labs/symptomatic assessment, will continue to decrease her diuretic doses based on labs/assessment of input/output. 3.  C. difficile infection/colitis: Continues to improve well on oral vancomycin 4.  Hypokalemia/metabolic alkalosis: Secondary to GI losses and now diuresis/contraction alkalosis.  I will prescribe for acetazolamide today and agree with switching to oral furosemide beginning tomorrow.  Subjective:   She reports that she continues to slowly feel better.  Denies any chest pain or shortness of breath.   Objective:   BP (!) 120/45 (BP Location: Right Arm)   Pulse 72   Temp 98.1 F (36.7 C) (Oral)   Resp 18   Ht 5' 1.5" (1.562 m)   Wt 64.2 kg   SpO2 95%   BMI 26.31 kg/m   Intake/Output Summary (Last 24 hours) at 08/06/2018 1048 Last data filed at 08/06/2018 0815 Gross per 24 hour  Intake -  Output 900 ml  Net -900 ml   Weight change: -1.9 kg  Physical Exam: Gen: Comfortably resting flat in bed CVS: Pulse regular rhythm, normal rate, S1 and S2 normal Resp: Diminished breath sounds over bases-poor inspiratory effort, no distinct rales Abd: Soft, obese, nontender Ext: 1-2+ left lower extremity edema, 1+ right lower extremity edema  Imaging: No results found.  Labs: BMET Recent Labs  Lab 07/31/18 0516 08/01/18 0532 08/02/18 0558 08/03/18 0417 08/04/18 0442 08/05/18 0557 08/06/18 0551  NA 139 140 141 141 139 142 141  K 3.1* 4.1 3.6 3.6 3.4* 3.6 3.6  CL 99 100 97* 95* 89* 87* 88*  CO2 26 27 29 31  36* 40* 40*   GLUCOSE 100* 172* 96 172* 209* 203* 213*  BUN 57* 65* 70* 70* 73* 74* 74*  CREATININE 3.25* 2.88* 2.77* 2.58* 2.52* 2.12* 1.91*  CALCIUM 6.4* 7.3* 7.3* 7.3* 7.4* 7.7* 7.7*  PHOS  --   --   --   --   --  4.8* 3.8   CBC Recent Labs  Lab 07/31/18 0516 08/03/18 0417  WBC 11.8* 12.0*  NEUTROABS 9.3* 9.6*  HGB 10.7* 11.3*  HCT 33.1* 35.8*  MCV 90.4 92.5  PLT 140* 184    Medications:    . acetaZOLAMIDE  250 mg Intravenous Once  . apixaban  2.5 mg Oral BID  . feeding supplement  1 Container Oral BID BM  . feeding supplement (ENSURE ENLIVE)  237 mL Oral BID BM  . [START ON 08/07/2018] furosemide  80 mg Oral Daily  . Gerhardt's butt cream   Topical BID  . insulin aspart  0-9 Units Subcutaneous TID WC  . mouth rinse  15 mL Mouth Rinse BID  . metoprolol tartrate  12.5 mg Oral BID  . multivitamin with minerals  1 tablet Oral Daily  . nystatin   Topical BID  . predniSONE  30 mg Oral Q breakfast  . sodium chloride flush  10-40 mL Intracatheter Q12H  . vancomycin  125 mg Oral QID   Elmarie Shiley, MD 08/06/2018, 10:48 AM

## 2018-08-06 NOTE — Progress Notes (Signed)
ANTICOAGULATION CONSULT NOTE - Initial Consult  Pharmacy Consult for apixaban Indication: atrial fibrillation  Allergies  Allergen Reactions  . Compazine [Prochlorperazine Edisylate] Other (See Comments)    REACTION: " tongue swell and unable to swallow"  . Sulfonamide Derivatives Other (See Comments)    REACTION: " broke out with fine itching bumps"    Patient Measurements: Height: 5' 1.5" (156.2 cm) Weight: 141 lb 8.6 oz (64.2 kg) IBW/kg (Calculated) : 48.95  Vital Signs: Temp: 98.1 F (36.7 C) (06/03 0644) Temp Source: Oral (06/03 0644) BP: 120/45 (06/03 0644) Pulse Rate: 72 (06/03 0644)  Labs: Recent Labs    08/04/18 0442 08/05/18 0557 08/06/18 0551  CREATININE 2.52* 2.12* 1.91*    Estimated Creatinine Clearance: 19.4 mL/min (A) (by C-G formula based on SCr of 1.91 mg/dL (H)).   Medical History: Past Medical History:  Diagnosis Date  . Arthritis, rheumatoid ([place])   . Bruit    Carotid Doppler showed no significant abnormality     9per patient)  . Chest pain, unspecified    Nuclear, May, 2008, no scar or ischemia  . Diabetes mellitus   . Dyslipidemia   . Ejection fraction    EF 55-60%, echo, February, 2011  . GERD (gastroesophageal reflux disease)   . Mitral regurgitation April 21, 2009   mild,  echo, February, 2011  . Osteoporosis   . Palpitations    possible very brief atrial fibrillation on monitor and possible reentrant tachycardia  . Psoriasis   . Rheumatoid arthritis [facility])     Assessment: Pharmacy consulted to dose apixaban for atrial fibrillation. Pt currently on HSQ for DVT ppx.  Today, 08/06/18  CBC has been stable. Last checked on 5/31 (Hgb 11.3, Plt 184)  SCr 1.9 - elevated but improving, CrCl ~23 mL/min  Age: 85 Wt: 64 kg SCr: 1.9  Goal of Therapy:  Monitor platelets by anticoagulation protocol: Yes   Plan:   Discontinue heparin subQ  Initiate apixaban 2.5 mg PO BID  Reduced dose given age and SCr  Check CBC with AM  labs tomorrow  Monitor for signs/symptoms of bleeding or thrombosis  Lenis Noon, PharmD 08/06/2018,10:17 AM

## 2018-08-06 NOTE — IPOC Note (Signed)
Individualized overall Plan of Care [facility]) Patient Details Name: Shelby Walker MRN: [id number] DOB: 02/12/1934  Admitting Diagnosis: [facility] Problems: Active Problems:   Debility   AKI (acute kidney injury) ([place])   Diabetes mellitus type 2 in nonobese ([place])   New onset atrial fibrillation (HCC)   Acute blood loss anemia   Leukocytosis   Hypoalbuminemia due to protein-calorie malnutrition (HCC)     Functional Problem List: Nursing Bowel, Edema, Endurance, Medication Management, Nutrition, Pain, Safety, Skin Integrity  PT Balance, Motor, Pain, Safety, Endurance  OT Balance, Pain, Safety  SLP    TR         Basic ADL's: OT Grooming, Bathing, Dressing, Toileting     Advanced  ADL's: OT Light Housekeeping     Transfers: PT Bed Mobility, Bed to Chair, Car, Manufacturing systems engineer, Metallurgist: PT Ambulation, Emergency planning/management officer, Stairs     Additional Impairments: OT    SLP        TR      Anticipated Outcomes Item Anticipated Outcome  Self Feeding Mod I  Swallowing      Basic self-care  Mod I  Toileting  Mod I   Bathroom Transfers Mod I  Bowel/Bladder  supervision  Transfers  mod I  Locomotion  S with AD  Communication     Cognition     Pain  less<2  Safety/Judgment  supervision   Therapy Plan: PT Intensity: Minimum of 1-2 x/day ,45 to 90 minutes PT Frequency: 5 out of 7 days PT Duration Estimated Length of Stay: 3-3.5 weeks OT Intensity: Minimum of 1-2 x/day, 45 to 90 minutes      Team Interventions: Nursing Interventions Patient/Family Education, Disease Management/Prevention, Skin Care/Wound Management, Discharge Planning, Pain Management, Cognitive Remediation/Compensation, Psychosocial Support, Bowel Management, Medication Management  PT interventions Ambulation/gait training, Balance/vestibular training, Functional mobility training, Patient/family education, Therapeutic Exercise, Therapeutic Activities, Stair  training, DME/adaptive equipment instruction, Wheelchair propulsion/positioning, UE/LE Strength taining/ROM  OT Interventions Balance/vestibular training, Neuromuscular re-education, Self Care/advanced ADL retraining, Therapeutic Exercise, UE/LE Strength taining/ROM, Pain management, DME/adaptive equipment instruction, Patient/family education, Therapeutic Activities, Functional mobility training, Discharge planning  SLP Interventions    TR Interventions    SW/CM Interventions Discharge Planning, Psychosocial Support, Patient/Family Education   Barriers to Discharge MD  Medical stability, Wound care and Weight bearing restrictions  Nursing      PT Decreased caregiver support, Medical stability spouse with heart condition per pt with defibrillator; has significant orthostatic hypotension with continued diuresis planned  OT      SLP      SW       Team Discharge Planning: Destination: PT-Home ,OT- Home , SLP-  Projected Follow-up: PT-Home health PT, 24 hour supervision/assistance, OT-  Home health OT, Other (comment)(TBD), SLP-  Projected Equipment Needs: PT-Rolling walker with 5" wheels, OT- 3 in 1 bedside comode, Tub/shower seat, SLP-  Equipment Details: PT- , OT-  Patient/family involved in discharge planning: PT- Patient,  OT-Patient, SLP-   MD ELOS: 16-19 days. Medical Rehab Prognosis:  Excellent Assessment:  Shelby Walker is an 85 year old right-handed female with history of diabetes mellitus, hypertension, rheumatoid arthritis maintained on Enbrel as well as prednisone.  Presented 07/24/2018 with nausea, vomiting, diarrhea.  She was recently seen at urgent care diagnosed with infectious colitis and completed 7-day course of Cipro and Flagyl without improvement.  She was noted to have SIRS on evaluation in the ED.  Noted fever of 100.5, heart rate 104, WBC  15,300, BUN 23, creatinine 1.22.  Urinalysis negative nitrite, COVID negative, troponin negative.  Chest x-ray unremarkable.  CT  abdomen pelvis noted diffuse colitis without perforation or abscess.  C. difficile specimen was positive and maintained on vancomycin with contact precautions.  Hospital course complicated by PAF, placed on amiodarone converted back to sinus rhythm.  Echocardiogram with ejection fraction of 65%.  Eliquis was initiated for PAF as well as Lopressor.  Renal services consulted 07/30/2018 for elevated creatinine 3.98 from baseline 1.22.  Renal ultrasound showed no hydronephrosis.  There was some new mild to moderate abdominal ascites.  Patient responded well to Lasix therapy.  Patient with resulting functional deficits with weakness, endurance, limitation self-care.  We will set goals for supervision/mod I with PT/OT.   Due to the current state of emergency, patients may not be receiving their 3-hours of Medicare-mandated therapy.  See Team Conference Notes for weekly updates to the plan of care

## 2018-08-06 NOTE — H&P (Signed)
Physical Medicine and Rehabilitation Admission H&P    Chief Complaint  Patient presents with  . Abdominal Cramps  Chief complaint: Weakness HPI: Shelby Walker is an 85 year old right-handed female with history of diabetes mellitus, hypertension, rheumatoid arthritis maintained on Enbrel as well as prednisone.  Per chart review and patient, patient lives with daughter and son-in-law.  Independent prior to admission.  Two-level home with bedroom on main level.  Presented 07/24/2018 with nausea, vomiting, diarrhea.  She was recently seen at urgent care diagnosed with infectious colitis and completed 7-day course of Cipro and Flagyl without improvement.  She was noted to have SIRS on evaluation in the ED.  Noted fever of 100.5, heart rate 104, WBC 15,300, BUN 23, creatinine 1.22.  Urinalysis negative nitrite, COVID negative, troponin negative.  Chest x-ray unremarkable.  CT abdomen pelvis noted diffuse colitis without perforation or abscess.  C. difficile specimen was positive and maintained on vancomycin with contact precautions.  Hospital course complicated by PAF, placed on amiodarone converted back to sinus rhythm.  Echocardiogram with ejection fraction of 65%.  Eliquis was initiated for PAF as well as Lopressor.  Renal services consulted 07/30/2018 for elevated creatinine 3.98 from baseline 1.22.  Renal ultrasound showed no hydronephrosis.  There was some new mild to moderate abdominal ascites.  Patient responded well to Lasix therapy and latest creatinine 1.91.  Therapy evaluations completed and patient was admitted for a comprehensive rehab program.  Please see preadmission assessment from today as well.  Review of Systems  Constitutional: Positive for malaise/fatigue.  HENT: Negative for hearing loss.   Eyes: Negative for blurred vision and double vision.  Respiratory: Negative for cough and shortness of breath.   Cardiovascular: Negative for chest pain.  Gastrointestinal: Positive for abdominal  pain, diarrhea, nausea and vomiting. Negative for blood in stool.  Genitourinary: Negative for dysuria, flank pain and hematuria.  Musculoskeletal: Positive for joint pain and myalgias.  Skin: Negative for rash.  Neurological: Negative for seizures.  All other systems reviewed and are negative.  Past Medical History:  Diagnosis Date  . Arthritis, rheumatoid ([place])   . Bruit    Carotid Doppler showed no significant abnormality     9per patient)  . Chest pain, unspecified    Nuclear, May, 2008, no scar or ischemia  . Diabetes mellitus   . Dyslipidemia   . Ejection fraction    EF 55-60%, echo, February, 2011  . GERD (gastroesophageal reflux disease)   . Mitral regurgitation April 21, 2009   mild,  echo, February, 2011  . Osteoporosis   . Palpitations    possible very brief atrial fibrillation on monitor and possible reentrant tachycardia  . Psoriasis   . Rheumatoid arthritis [facility])    Past Surgical History:  Procedure Laterality Date  . FRACTURE SURGERY     Family History  Problem Relation Age of Onset  . Heart failure Father   . Heart attack Father   . Diabetes Father   . Heart attack Mother   . Heart failure Mother   . Diabetes Mother   . Stroke Sister    Social History:  reports that she has never smoked. She has never used smokeless tobacco. She reports that she does not drink alcohol or use drugs. Allergies:  Allergies  Allergen Reactions  . Compazine [Prochlorperazine Edisylate] Other (See Comments)    REACTION: " tongue swell and unable to swallow"  . Sulfonamide Derivatives Other (See Comments)    REACTION: " broke out with  fine itching bumps"   Medications Prior to Admission  Medication Sig Dispense Refill  . aspirin 325 MG EC tablet Take 325 mg by mouth at bedtime.    Marland Kitchen atorvastatin (LIPITOR) 20 MG tablet TAKE 1 TABLET BY MOUTH EVERY DAY (Patient taking differently: Take 20 mg by mouth at bedtime. ) 90 tablet 3  . bisacodyl (DULCOLAX) 5 MG EC tablet Take  5 mg by mouth daily as needed for moderate constipation.    . Calcium Carbonate-Vitamin D (CALTRATE 600+D) 600-400 MG-UNIT per tablet Take 1 tablet by mouth daily.      . Cholecalciferol 1000 units capsule Take 1,000 Units by mouth daily.     Marland Kitchen diltiazem (CARDIZEM) 60 MG tablet Take 60 mg by mouth at bedtime.    Marland Kitchen esomeprazole (NEXIUM) 40 MG capsule Take 40 mg by mouth daily at 12 noon.    . etanercept (ENBREL) 50 MG/ML injection Inject 50 mg into the skin once a week.    . metoprolol succinate (TOPROL-XL) 25 MG 24 hr tablet Take 25 mg by mouth daily.      . Multiple Vitamins-Minerals (CENTRUM SILVER ULTRA WOMENS) TABS Take 1 tablet by mouth daily.     . nitroGLYCERIN (NITROSTAT) 0.4 MG SL tablet 1 TABLET UNDER TONGUE AT ONSET OF CHEST PAIN YOU MAY REPEAT EVERY 5 MINUTES FOR UP TO 3 DOSES. (Patient taking differently: Place 0.4 mg under the tongue See admin instructions. 1 TABLET UNDER TONGUE AT ONSET OF CHEST PAIN YOU MAY REPEAT EVERY 5 MINUTES FOR UP TO 3 DOSES.) 25 tablet 3  . Omega-3 Fatty Acids (FISH OIL) 1200 MG CAPS Take 1 capsule by mouth daily at 12 noon.    . predniSONE (DELTASONE) 5 MG tablet Take 5 mg by mouth daily.  3  . [DISCONTINUED] insulin NPH Human (NOVOLIN N) 100 UNIT/ML injection Inject 20 Units into the skin at bedtime.      Drug Regimen Review  Drug regimen was reviewed and remains appropriate with no significant issues identified  Home: Home Living Family/patient expects to be discharged to:: Private residence Living Arrangements: Spouse/significant other Available Help at Discharge: Family, Friend(s)(dtr and son law) Type of Home: House Home Access: Other (comment) Home Layout: Able to live on main level with bedroom/bathroom, Laundry or work area in Water quality scientist to basement) Bathroom Shower/Tub: Tub/shower unit, Walk-in shower(both but mostly uses walk in) [employer]: Standard Home Equipment: Environmental consultant - 2 wheels, [employer] - single point   Functional  History: Prior Function Level of Independence: Independent Comments: was not going to grocery store due to [place] pandemic; family providing meal  Functional Status:  Mobility: Bed Mobility Overal bed mobility: Needs Assistance Bed Mobility: Supine to Sit, Rolling Rolling: Min assist Sidelying to sit: Mod assist Supine to sit: Max assist, +2 for physical assistance Sit to supine: Max assist, +2 for physical assistance General bed mobility comments: OOB in recliner  Transfers Overall transfer level: Needs assistance Equipment used: None, Rolling walker (2 wheeled) Transfers: Sit to/from Stand, [employer] Transfers Sit to Stand: Mod assist Stand pivot transfers: Mod assist General transfer comment: stand pivot sit 1/4 turn to [facility] with increased time to power up and 25% VC's on turn completion.   Ambulation/Gait Ambulation/Gait assistance: Mod assist, Max assist, +2 physical assistance, +2 safety/equipment Gait Distance (Feet): 8 Feet Assistive device: Rolling walker (2 wheeled) Gait Pattern/deviations: Step-to pattern, Decreased stance time - left General Gait Details: + 2 side by side assist a limited distance due to increased c/o  dizziness and L knee buckled x 2.  Recliner following.  HIGH FALL RISK. Gait velocity: decreased     ADL: ADL Overall ADL's : Needs assistance/impaired Eating/Feeding: Set up, Sitting, Bed level Grooming: Wash/dry face, Oral care, Sitting Grooming Details (indicate cue type and reason): to don chapstick up in chair Upper Body Bathing: Moderate assistance, Sitting Lower Body Bathing: Sit to/from stand, Cueing for sequencing, Cueing for safety, Maximal assistance Upper Body Dressing : Set up, Sitting Lower Body Dressing: Maximal assistance, Sit to/from stand, Cueing for sequencing, Cueing for safety Toilet Transfer: Moderate assistance, RW, Stand-pivot, Comfort height toilet Toilet Transfer Details (indicate cue type and reason): simulated with  recliner Toileting- Clothing Manipulation and Hygiene: Maximal assistance, Sit to/from stand, Cueing for sequencing, Cueing for safety Toileting - Clothing Manipulation Details (indicate cue type and reason): incontinent of stool; on foley Tub/ Shower Transfer: Total assistance Functional mobility during ADLs: Maximal assistance, +2 for physical assistance, +2 for safety/equipment(stand pivot t/f) General ADL Comments: Pt needed much encouragement.  Pt did get OOB, to BSc then to chair with OT  Cognition: Cognition Overall Cognitive Status: Within Functional Limits for tasks assessed Orientation Level: Oriented X4 Cognition Arousal/Alertness: Awake/alert Behavior During Therapy: WFL for tasks assessed/performed Overall Cognitive Status: Within Functional Limits for tasks assessed General Comments: AxO x 3  Physical Exam: Blood pressure (!) 120/45, pulse 72, temperature 98.1 F (36.7 C), temperature source Oral, resp. rate 18, height 5' 1.5" (1.562 m), weight 64.2 kg, SpO2 95 %. Physical Exam  Vitals reviewed. Constitutional: She appears well-developed and well-nourished.  HENT:  Head: Normocephalic and atraumatic.  Eyes: Right eye exhibits no discharge. Left eye exhibits no discharge.  Keeps eyes closed  Respiratory: Effort normal. No respiratory distress.  + [place]  GI: She exhibits no distension.  Musculoskeletal:     Comments: No edema or tenderness in extremities Ulnar deviation at MCP joints bilaterally  Neurological: She is alert.  Alert female no acute distress.   Follows commands.   HOH Motor: Right upper extremity: 5/5 proximal distal Right lower extremity: 4-4+/5 proximal distal Left upper extremity: 4/5 proximal distal Left lower extremity: 2+/5 proximal to distal  Skin: Skin is warm and dry.  Psychiatric: Her affect is blunt. Her speech is delayed. She is slowed.    Results for orders placed or performed during the hospital encounter of 07/24/18 (from the past 48  hour(s))  Glucose, capillary     Status: Abnormal   Collection Time: 08/04/18 12:15 PM  Result Value Ref Range   Glucose-Capillary 224 (H) 70 - 99 mg/dL  Glucose, capillary     Status: Abnormal   Collection Time: 08/04/18  4:43 PM  Result Value Ref Range   Glucose-Capillary 285 (H) 70 - 99 mg/dL  Glucose, capillary     Status: Abnormal   Collection Time: 08/04/18  9:26 PM  Result Value Ref Range   Glucose-Capillary 238 (H) 70 - 99 mg/dL  Renal function panel     Status: Abnormal   Collection Time: 08/05/18  5:57 AM  Result Value Ref Range   Sodium 142 135 - 145 mmol/L   Potassium 3.6 3.5 - 5.1 mmol/L   Chloride 87 (L) 98 - 111 mmol/L   CO2 40 (H) 22 - 32 mmol/L   Glucose, Bld 203 (H) 70 - 99 mg/dL   BUN 74 (H) 8 - 23 mg/dL   Creatinine, Ser 2.12 (H) 0.44 - 1.00 mg/dL   Calcium 7.7 (L) 8.9 - 10.3 mg/dL   Phosphorus  4.8 (H) 2.5 - 4.6 mg/dL   Albumin 2.5 (L) 3.5 - 5.0 g/dL   GFR calc non Af Amer 21 (L) >60 mL/min   GFR calc Af Amer 24 (L) >60 mL/min   Anion gap 15 5 - 15    Comment: Performed at [facility], [place] [address], [place], [place] [address]  Glucose, capillary     Status: Abnormal   Collection Time: 08/05/18  8:16 AM  Result Value Ref Range   Glucose-Capillary 190 (H) 70 - 99 mg/dL  Glucose, capillary     Status: Abnormal   Collection Time: 08/05/18 11:57 AM  Result Value Ref Range   Glucose-Capillary 264 (H) 70 - 99 mg/dL  Glucose, capillary     Status: Abnormal   Collection Time: 08/05/18  4:47 PM  Result Value Ref Range   Glucose-Capillary 418 (H) 70 - 99 mg/dL  Glucose, capillary     Status: Abnormal   Collection Time: 08/05/18  9:31 PM  Result Value Ref Range   Glucose-Capillary 327 (H) 70 - 99 mg/dL  Renal function panel     Status: Abnormal   Collection Time: 08/06/18  5:51 AM  Result Value Ref Range   Sodium 141 135 - 145 mmol/L   Potassium 3.6 3.5 - 5.1 mmol/L   Chloride 88 (L) 98 - 111 mmol/L   CO2 40 (H) 22 - 32 mmol/L    Glucose, Bld 213 (H) 70 - 99 mg/dL   BUN 74 (H) 8 - 23 mg/dL   Creatinine, Ser 1.91 (H) 0.44 - 1.00 mg/dL   Calcium 7.7 (L) 8.9 - 10.3 mg/dL   Phosphorus 3.8 2.5 - 4.6 mg/dL   Albumin 2.4 (L) 3.5 - 5.0 g/dL   GFR calc non Af Amer 24 (L) >60 mL/min   GFR calc Af Amer 28 (L) >60 mL/min   Anion gap 13 5 - 15    Comment: Performed at [facility], [place] [address], [place], [place] [address]  Glucose, capillary     Status: Abnormal   Collection Time: 08/06/18  7:57 AM  Result Value Ref Range   Glucose-Capillary 182 (H) 70 - 99 mg/dL   No results found.     Medical Problem List and Plan: 1.  Debility secondary to septic shock due to C. difficile colitis.  Complete course of vancomycin x4 more days.  Contact precautions  Admit to CIR 2.  Antithrombotics: -DVT/anticoagulation: Eliquis  -antiplatelet therapy: N/A 3. Pain Management: Oxycodone as needed 4. Mood: Provide emotional support  -antipsychotic agents: N/A 5. Neuropsych: This patient is ?  Fully capable of making decisions on her own behalf. 6. Skin/Wound Care: Routine skin checks 7. Fluids/Electrolytes/Nutrition: Routine in and outs  Follow-up BMP tomorrow a.m.  8.  New onset PAF.  Lopressor 12.5 mg twice daily.  Continue Eliquis.  Cardiac rate controlled  Monitor with increased mobility 9.  Diabetes mellitus.  Hemoglobin A1c 8.8.  Sliding scale insulin. 10.  Rheumatoid arthritis.  Patient on Enbrel 50 mg injection weekly and can be resumed as outpatient.  Continue chronic prednisone 5 mg daily 11.  AKI.  Responding well to Lasix and plan 80 mg daily x30 days.  Follow-up renal services  BMP ordered for tomorrow a.m.  Post Admission Physician Evaluation: 1. Preadmission assessment reviewed and changes made below. 2. Functional deficits secondary  to debility. 3. Patient is admitted to receive collaborative, interdisciplinary care between the physiatrist, rehab nursing staff, and therapy team. 4. Patient  has  experienced substantial functional loss from his/her baseline which was documented above under the "Functional History" and "Functional Status" headings.  Judging by the patient's diagnosis, physical exam, and functional history, the patient has potential for functional progress which will result in measurable gains while on inpatient rehab.  These gains will be of substantial and practical use upon discharge  in facilitating mobility and self-care at the household level. 5. Physiatrist will provide 24 hour management of medical needs as well as oversight of the therapy plan/treatment and provide guidance as appropriate regarding the interaction of the two. 6. 24 hour rehab nursing will assist with bowel management, safety, disease management, medication administration and patient education  and help integrate therapy concepts, techniques,education, etc. 7. PT will assess and treat for/with: Lower extremity strength, range of motion, stamina, balance, functional mobility, safety, adaptive techniques and equipment, coping skills, pain control, education. Goals are: Min A. 8. OT will assess and treat for/with: ADL's, functional mobility, safety, upper extremity strength, adaptive techniques and equipment, ego support, and community reintegration.   Goals are: Min A. Therapy may proceed with showering this patient. 9. Case Management and Social Worker will assess and treat for psychological issues and discharge planning. 10. Team conference will be held weekly to assess progress toward goals and to determine barriers to discharge. 11. Patient will receive at least 3 hours of therapy per day at least 5 days per week. 12. ELOS: 14-18 days.       13. Prognosis:  good  I have personally performed a face to face diagnostic evaluation, including, but not limited to relevant history and physical exam findings, of this patient and developed relevant assessment and plan.  Additionally, I have reviewed and concur  with the physician assistant's documentation above.  Delice Lesch, MD, ABPMR Lavon Paganini Angiulli, PA-C 08/06/2018

## 2018-08-06 NOTE — Progress Notes (Signed)
Pt discharged to [facility] today per Dr. Cathlean Sauer. Pt's IV site D/C'd and WDL. Pt's VSS. Pt's daughter updated and made aware of discharge plans. Verbalized understanding. AVS and transport form provided to [facility]. Report called to [place], Therapist, sports at [facility]. Verbalized understanding. Pt left floor via stretcher accompanied by [facility] in stable condition.

## 2018-08-06 NOTE — TOC Transition Note (Signed)
Transition of Care [facility]) - CM/SW Discharge Note   Patient Details  Name: Shelby Walker MRN: [id number] Date of Birth: May 24, 1933  [facility] CM/SW Contact:  Dessa Phi, RN Phone Number: 08/06/2018, 10:31 AM   Clinical Narrative:  D/c CIR-Nsg to manage d/c transport. No further CM needs.    Final next level of care: IP Rehab Facility Barriers to Discharge: No Barriers Identified   Patient Goals and CMS Choice   CMS Medicare.gov Compare Post Acute Care list provided to:: Patient Represenative (must comment)(daugheter, [name]) Choice offered to / list presented to : Adult Children  Discharge Placement                       Discharge Plan and Services   Discharge Planning Services: CM Consult Post Acute Care Choice: Home Health                    HH Arranged: PT, OT [facility] Agency: [facility] Date [facility] Agency Contacted: 07/31/18 Time [place]: 1030 Representative spoke with at [place]: [place] ([place]) Interventions     Readmission Risk Interventions No flowsheet data found.

## 2018-08-06 NOTE — Progress Notes (Signed)
Inpatient Rehabilitation-Admissions Coordinator   Spoke with Dr. Cathlean Sauer today regarding readiness for CIR. He gave approval for pt to be admitted to CIR today. AC has spoken to both pt and her family who are still in agreement for CIR today.   AC will update RN and CM/SW on plan.   Please call if questions.   Jhonnie Garner, OTR/L  Rehab Admissions Coordinator  [phone] 08/06/2018 9:57 AM

## 2018-08-06 NOTE — Progress Notes (Signed)
Jamse Arn, MD  Physician  Physical Medicine and Rehabilitation  PMR Pre-admission  Signed  Date of Service:  08/05/2018 2:23 PM       Related encounter: ED to Hosp-Admission (Discharged) from 07/24/2018 in [place]         PMR Admission Coordinator Pre-Admission Assessment  Patient: Shelby Walker is an 85 y.o., female MRN: [id number] DOB: 05/23/1933 Height: 5' 1.5" (156.2 cm) Weight: 64.2 kg  Insurance Information HMO:     PPO:      PCP:      IPA:      80/20: yes     OTHER:  PRIMARY: Medicare A and B      Policy#: [id number]      Subscriber: Patient CM Name:       Phone#:      Fax#:  Pre-Cert#:       Employer:  Benefits:  Phone #: NA     Name: verified eligibility online via [place] on 08/05/18 Eff. Date: Part A: effective 06/04/99; Part B: effective 06/04/99     Deduct: $1,408      Out of Pocket Max: NA      Life Max: NA CIR: Covered per Medicare guidelines once yearly deductible is met      SNF: days 1-20, 100%, days 21-100, 80% Outpatient: 80%     Co-Pay: 20% Home Health: 100%      Co-Pay:  DME: 80%     Co-Pay: 20% Providers: Pt's choice SECONDARY: [employer]      Policy#: [id number]      Subscriber: Patient CM Name:       Phone#:      Fax#:  Pre-Cert#:       Employer:  Benefits:  Phone #: [phone]     Name:  Eff. Date:      Deduct:       Out of Pocket Max:       Life Max:  CIR:       SNF:  Outpatient:      Co-Pay:  Home Health:       Co-Pay: DME:      Co-Pay:  Medicaid Application Date:       Case Manager:  Disability Application Date:       Case Worker:   The "Data Collection Information Summary" for patients in Inpatient Rehabilitation Facilities with attached "Privacy Act [place] Records" was provided and verbally reviewed with: Family  Emergency Contact Information         Contact Information    Name Relation Home Work Twilight Daughter   [place] [phone]     Caitlyn, [place]   [phone]      Current Medical History  Patient Admitting Diagnosis: Debility from septic shock with C.difficile colitis   History of Present Illness: Pt is an 69 you F with history of Type 2 DM, HTN, hyperlipidemia, GERD, and RA on chronic prednisone with presented to the hospital on 07/24/18 with nausea, vomiting, diarrhea for the past 3 weeks. Pt had previously been seen in urgent care and diagnoses with infectious colitis and treated with 7 days of ciprofloxacin and Flagyl but had no improvement of symptoms. Once she presented to the hospital, she was febrile, tachycardic, with CT showing diffuse colitis; no perforation or abscess noted. She developed AKI, worsening leukocytosis and even lower BP. On 5/22 pt became tachycardic and  hypotensive; pt was subsequently transferred to ICU on 5/23 requiring vasopressors. On 5/24, pt developed a fib with RVR and started on amiodarone with pt noted to be in sinus rhythm on 5/26. Pt required diuresing with IV lasix due to 14 L positive balance with improvement. Pt was diagnosed with C. Difficile colitis and showing clinical improvement. Pt presents with debility from multiple medical issues. Pt has 24/7 A at DC from family members and pt motivated to return straight home from CIR. Pt and family want to pursue CIR at this time for short term, intensive rehab to allow her to return home safely with assist.   Patient's medical record from [facility] has been reviewed by the rehabilitation admission coordinator and physician.  Past Medical History      Past Medical History:  Diagnosis Date  . Arthritis, rheumatoid ([place])   . Bruit    Carotid Doppler showed no significant abnormality     9per patient)  . Chest pain, unspecified    Nuclear, May, 2008, no scar or ischemia  . Diabetes mellitus   . Dyslipidemia   . Ejection fraction    EF 55-60%, echo, February,  2011  . GERD (gastroesophageal reflux disease)   . Mitral regurgitation April 21, 2009   mild,  echo, February, 2011  . Osteoporosis   . Palpitations    possible very brief atrial fibrillation on monitor and possible reentrant tachycardia  . Psoriasis   . Rheumatoid arthritis ([place])     Family History   family history includes Diabetes in her father and mother; Heart attack in her father and mother; Heart failure in her father and mother; Stroke in her sister.  Prior Rehab/Hospitalizations Has the patient had prior rehab or hospitalizations prior to admission? No  Has the patient had major surgery during 100 days prior to admission? No              Current Medications  Current Facility-Administered Medications:  .  acetaminophen (TYLENOL) tablet 650 mg, 650 mg, Oral, Q6H PRN, 650 mg at 08/02/18 0526 **OR** acetaminophen (TYLENOL) suppository 650 mg, 650 mg, Rectal, Q6H PRN, [place] Eric J, DO, 650 mg at 07/25/18 0256 .  albuterol (PROVENTIL) (2.5 MG/3ML) 0.083% nebulizer solution 2.5 mg, 2.5 mg, Nebulization, Q3H PRN, Anders Simmonds, MD, 2.5 mg at 07/31/18 1027 .  feeding supplement (BOOST / RESOURCE BREEZE) liquid 1 Container, 1 Container, Oral, BID BM, Barb Merino, MD, 1 Container at 08/06/18 [phone] .  feeding supplement (ENSURE ENLIVE) (ENSURE ENLIVE) liquid 237 mL, 237 mL, Oral, BID BM, Barb Merino, MD, 237 mL at 08/05/18 1209 .  furosemide (LASIX) injection 80 mg, 80 mg, Intravenous, Q12H, Elmarie Shiley, MD, 80 mg at 08/06/18 0656 .  Gerhardt's butt cream, , Topical, BID, Adhikari, Amrit, MD .  heparin injection 5,000 Units, 5,000 Units, Subcutaneous, Q8H, Margaretha Seeds, MD, 5,000 Units at 08/06/18 [phone] .  insulin aspart (novoLOG) injection 0-9 Units, 0-9 Units, Subcutaneous, TID WC, [place] Eric J, DO, 2 Units at 08/06/18 7096 .  MEDLINE mouth rinse, 15 mL, Mouth Rinse, BID, Margaretha Seeds, MD, 15 mL at 08/05/18 2133 .  metoprolol tartrate (LOPRESSOR)  tablet 12.5 mg, 12.5 mg, Oral, BID, Roney Jaffe, MD, 12.5 mg at 08/06/18 2836 .  morphine 2 MG/ML injection 2 mg, 2 mg, Intravenous, Q4H PRN, Shelly Coss, MD, 2 mg at 08/03/18 2159 .  multivitamin with minerals tablet 1 tablet, 1 tablet, Oral, Daily, Barb Merino, MD, 1 tablet at 08/06/18  1610 .  nitroGLYCERIN (NITROSTAT) SL tablet 0.4 mg, 0.4 mg, Sublingual, Q5 min PRN, Anders Simmonds, MD, 0.4 mg at 08/05/18 0954 .  nystatin (MYCOSTATIN/NYSTOP) topical powder, , Topical, BID, Adhikari, Amrit, MD .  ondansetron (ZOFRAN) tablet 4 mg, 4 mg, Oral, Q6H PRN **OR** ondansetron (ZOFRAN) injection 4 mg, 4 mg, Intravenous, Q6H PRN, [place] Eric J, DO, 4 mg at 08/04/18 1601 .  oxyCODONE (Oxy IR/ROXICODONE) immediate release tablet 5 mg, 5 mg, Oral, Q6H PRN, Barb Merino, MD, 5 mg at 08/06/18 0813 .  polyvinyl alcohol (LIQUIFILM TEARS) 1.4 % ophthalmic solution 1 drop, 1 drop, Both Eyes, PRN, Anders Simmonds, MD .  predniSONE (DELTASONE) tablet 30 mg, 30 mg, Oral, Q breakfast, Barb Merino, MD, 30 mg at 08/06/18 9604 .  sodium chloride flush (NS) 0.9 % injection 10-40 mL, 10-40 mL, Intracatheter, Q12H, Margaretha Seeds, MD, 10 mL at 08/05/18 2134 .  sodium chloride flush (NS) 0.9 % injection 10-40 mL, 10-40 mL, Intracatheter, PRN, Margaretha Seeds, MD .  vancomycin (VANCOCIN) 50 mg/mL oral solution 125 mg, 125 mg, Oral, QID, Barb Merino, MD, 125 mg at 08/06/18 5409  Patients Current Diet:     Diet Order                  Diet Carb Modified Fluid consistency: Thin; Room service appropriate? Yes  Diet effective now               Precautions / Restrictions Precautions Precautions: Fall Precaution Comments: "bda" L knee/dizzy Restrictions Weight Bearing Restrictions: No   Has the patient had 2 or more falls or a fall with injury in the past year? Yes  Prior Activity Level Community (5-7x/wk): very active and independent PTA and prior to 1 month ago when she became  ill with diarrhea. able to drive, was managing her own medicaitons and her husbands  Prior Functional Level Self Care: Did the patient need help bathing, dressing, using the toilet or eating? Independent  Indoor Mobility: Did the patient need assistance with walking from room to room (with or without device)? Independent  Stairs: Did the patient need assistance with internal or external stairs (with or without device)? Independent  Functional Cognition: Did the patient need help planning regular tasks such as shopping or remembering to take medications? Independent  Home Assistive Devices / Equipment Home Assistive Devices/Equipment: Environmental consultant (specify type) Home Equipment: Walker - 2 wheels, Cane - single point  Prior Device Use: Indicate devices/aids used by the patient prior to current illness, exacerbation or injury? None of the above  Current Functional Level Cognition  Overall Cognitive Status: Within Functional Limits for tasks assessed Orientation Level: Oriented X4 General Comments: AxO x 3    Extremity Assessment (includes Sensation/Coordination)  Upper Extremity Assessment: Generalized weakness(hand deformities from RA)  Lower Extremity Assessment: Generalized weakness    ADLs  Overall ADL's : Needs assistance/impaired Eating/Feeding: Set up, Sitting, Bed level Grooming: Wash/dry face, Oral care, Sitting Grooming Details (indicate cue type and reason): to don chapstick up in chair Upper Body Bathing: Moderate assistance, Sitting Lower Body Bathing: Sit to/from stand, Cueing for sequencing, Cueing for safety, Maximal assistance Upper Body Dressing : Set up, Sitting Lower Body Dressing: Maximal assistance, Sit to/from stand, Cueing for sequencing, Cueing for safety Toilet Transfer: Moderate assistance, RW, Stand-pivot, Comfort height toilet Toilet Transfer Details (indicate cue type and reason): simulated with recliner Toileting- Clothing Manipulation and  Hygiene: Maximal assistance, Sit to/from stand, Cueing for sequencing, Cueing for safety  Toileting - Clothing Manipulation Details (indicate cue type and reason): incontinent of stool; on foley Tub/ Shower Transfer: Total assistance Functional mobility during ADLs: Maximal assistance, +2 for physical assistance, +2 for safety/equipment(stand pivot t/f) General ADL Comments: Pt needed much encouragement.  Pt did get OOB, to BSc then to chair with OT    Mobility  Overal bed mobility: Needs Assistance Bed Mobility: Supine to Sit, Rolling Rolling: Min assist Sidelying to sit: Mod assist Supine to sit: Max assist, +2 for physical assistance Sit to supine: Max assist, +2 for physical assistance General bed mobility comments: OOB in recliner     Transfers  Overall transfer level: Needs assistance Equipment used: None, Rolling walker (2 wheeled) Transfers: Sit to/from Stand, [employer] Transfers Sit to Stand: Mod assist Stand pivot transfers: Mod assist General transfer comment: stand pivot sit 1/4 turn to [facility] with increased time to power up and 25% VC's on turn completion.      Ambulation / Gait / Stairs / Wheelchair Mobility  Ambulation/Gait Ambulation/Gait assistance: Mod assist, Max assist, +2 physical assistance, +2 safety/equipment Gait Distance (Feet): 8 Feet Assistive device: Rolling walker (2 wheeled) Gait Pattern/deviations: Step-to pattern, Decreased stance time - left General Gait Details: + 2 side by side assist a limited distance due to increased c/o dizziness and L knee buckled x 2.  Recliner following.  HIGH FALL RISK. Gait velocity: decreased     Posture / Balance Balance Overall balance assessment: Needs assistance Sitting-balance support: No upper extremity supported, Feet unsupported Sitting balance-Leahy Scale: Fair Standing balance support: Bilateral upper extremity supported Standing balance-Leahy Scale: Zero Standing balance comment: requiring max A +2 to  maintain balance    Special needs/care consideration BiPAP/CPAP : no CPM : no Continuous Drip IV: no Dialysis : no        Days : no Life Vest : no Oxygen : yes, 2L [place] Special Bed : no Trach Size : no Wound Vac (area) : no      Location : no Skin: eczema BLEs, MASD to bilateral buttocks, skin tear to left buttocks         Bowel mgmt: occasional loose stool, clinically improving C.diff. last BM: 08/06/18 per RN. Bladder mgmt: continent (use of BSC with assistance) Diabetic mgmt: yes Behavioral consideration : no Chemo/radiation : no   Previous Home Environment (from acute therapy documentation) Living Arrangements: Spouse/significant other Available Help at Discharge: Family, Friend(s)(dtr and son law) Type of Home: House Home Layout: Able to live on main level with bedroom/bathroom, Laundry or work area in basement(full flight to basement) Home Access: Other (comment) Bathroom Shower/Tub: Tub/shower unit, Walk-in shower(both but mostly uses walk in) [employer]: [place]: No  Discharge Living Setting Plans for Discharge Living Setting: Patient's home, Lives with (comment)(spouse ) Type of Home at Discharge: House Discharge Home Layout: One level(with basement for laundry only) Discharge Home Access: Stairs to enter Entrance Stairs-Rails: None Entrance Stairs-Number of Steps: 3 Discharge Bathroom Shower/Tub: Tub only, Walk-in shower(mostly uses walk-in) Discharge Bathroom Toilet: Standard Discharge Bathroom Accessibility: Yes How Accessible: Accessible via walker Does the patient have any problems obtaining your medications?: Yes (Describe)(per daughter, Assistance Program for [place] and DM meds)  Social/Family/Support Systems Patient Roles: Spouse(retired; plays cornhole with son) Sport and exercise psychologist Information: [name] (daughter): [phone]; Marlou Sa (son): [phone] (children appear to be main contact as husband is not in best health); children very involved  in her care. husband [name]): [phone] Anticipated Caregiver: son, daugther, son-in-law (plan to have schedule for 24/7  A) Anticipated Caregiver's Contact Information: see above Ability/Limitations of Caregiver: min A Caregiver Availability: 24/7 Discharge Plan Discussed with Primary Caregiver: Yes(pt, son, and daugther) Is Caregiver In Agreement with Plan?: Yes Does Caregiver/Family have Issues with Lodging/Transportation while Pt is in Rehab?: No  Goals/Additional Needs Patient/Family Goal for Rehab: PT/OT: Min A; SLP: NA Expected length of stay: 16-20 days Cultural Considerations: NA Dietary Needs: regular diet, thin liquids Equipment Needs: TBD Pt/Family Agrees to Admission and willing to participate: Yes Program Orientation Provided & Reviewed with Pt/Caregiver Including Roles  & Responsibilities: Yes(pt, daughter, and son)  Barriers to Discharge: Home environment access/layout  Barriers to Discharge Comments: steps to enter; older house, smaller doorframes, unsure if it is condusive for wc  Decrease burden of Care through IP rehab admission: NA  Possible need for SNF placement upon discharge: Possibly, however pt has great support from family (son, daughter, and son-in-law) who all live nearby and plan to provide 24/7 A at DC. Due to COVID-19 family is highly against SNF placement at this time. Pt is motivated to participate in CIR program for short term rehab and understands the expectations. After bedside assessment, feel pt is able to tolerate CIR as she is progressing in transfers and able to complete gait with assistance.   Patient Condition: I have reviewed medical records from [facility], spoken with CM, and patient, son and daughter. I met with patient at the bedside for inpatient rehabilitation assessment.  Patient will benefit from ongoing PT and OT, can actively participate in 3 hours of therapy a day 5 days of the week, and can make measurable gains during  the admission.  Patient will also benefit from the coordinated team approach during an Inpatient Acute Rehabilitation admission.  The patient will receive intensive therapy as well as Rehabilitation physician, nursing, social worker, and care management interventions.  Due to bladder management, bowel management, safety, skin/wound care, disease management, medication administration, pain management and patient education the patient requires 24 hour a day rehabilitation nursing.  The patient is currently Mod/Max A x2 for 8 feet with mobility and Max a for LB ADLs and toileting.  Discharge setting and therapy post discharge at home with home health is anticipated.  Patient has agreed to participate in the Acute Inpatient Rehabilitation Program and will admit 08/06/18.  Preadmission Screen Completed By:  Jhonnie Garner, 08/06/2018 9:37 AM ______________________________________________________________________   Discussed status with Dr. Posey Pronto on 08/06/18 at 9:51AM and received approval for admission today.  Admission Coordinator:  Jhonnie Garner, OT, time 9:51AM/Date 08/06/18   Assessment/Plan: Diagnosis: Debility   1. Does the need for close, 24 hr/day Medical supervision in concert with the patient's rehab needs make it unreasonable for this patient to be served in a less intensive setting? Potentially  2. Co-Morbidities requiring supervision/potential complications: Type 2 DM, HTN, hyperlipidemia, GERD, and RA on chronic prednisone 3. Due to bowel management, safety, disease management and patient education, does the patient require 24 hr/day rehab nursing? Yes 4. Does the patient require coordinated care of a physician, rehab nurse, PT (1-2 hrs/day, 5 days/week) and OT (1-2 hrs/day, 5 days/week) to address physical and functional deficits in the context of the above medical diagnosis(es)? Yes Addressing deficits in the following areas: balance, endurance, locomotion, strength, transferring, bathing, dressing,  toileting and psychosocial support 5. Can the patient actively participate in an intensive therapy program of at least 3 hrs of therapy 5 days a week? Yes 6. The potential for patient to make measurable gains  while on inpatient rehab is excellent 7. Anticipated functional outcomes upon discharge from inpatients are: min assist PT, min assist and mod assist OT, n/a SLP 8. Estimated rehab length of stay to reach the above functional goals is: 16-19 days. 9. Anticipated D/C setting: Home 10. Anticipated post D/C treatments: HH therapy and Home excercise program 11. Overall Rehab/Functional Prognosis: excellent  MD Signature: Delice Lesch, MD, ABPMR        Revision History    Date/Time User Provider Type Action  08/06/2018 11:01 AM Jamse Arn, MD Physician Sign  08/06/2018 10:23 AM Jhonnie Garner, OT Rehab Admission Coordinator Share  View Details Report

## 2018-08-07 ENCOUNTER — Inpatient Hospital Stay (HOSPITAL_COMMUNITY): Payer: Medicare Other

## 2018-08-07 ENCOUNTER — Encounter (HOSPITAL_COMMUNITY): Payer: Self-pay

## 2018-08-07 ENCOUNTER — Inpatient Hospital Stay (HOSPITAL_COMMUNITY): Payer: Medicare Other | Admitting: Occupational Therapy

## 2018-08-07 DIAGNOSIS — E8809 Other disorders of plasma-protein metabolism, not elsewhere classified: Secondary | ICD-10-CM

## 2018-08-07 DIAGNOSIS — D72829 Elevated white blood cell count, unspecified: Secondary | ICD-10-CM

## 2018-08-07 DIAGNOSIS — E46 Unspecified protein-calorie malnutrition: Secondary | ICD-10-CM

## 2018-08-07 DIAGNOSIS — D62 Acute posthemorrhagic anemia: Secondary | ICD-10-CM

## 2018-08-07 LAB — COMPREHENSIVE METABOLIC PANEL
ALT: 18 U/L (ref 0–44)
AST: 25 U/L (ref 15–41)
Albumin: 2.3 g/dL — ABNORMAL LOW (ref 3.5–5.0)
Alkaline Phosphatase: 47 U/L (ref 38–126)
Anion gap: 14 (ref 5–15)
BUN: 68 mg/dL — ABNORMAL HIGH (ref 8–23)
CO2: 40 mmol/L — ABNORMAL HIGH (ref 22–32)
Calcium: 8 mg/dL — ABNORMAL LOW (ref 8.9–10.3)
Chloride: 88 mmol/L — ABNORMAL LOW (ref 98–111)
Creatinine, Ser: 1.88 mg/dL — ABNORMAL HIGH (ref 0.44–1.00)
GFR calc Af Amer: 28 mL/min — ABNORMAL LOW (ref >60)
GFR calc non Af Amer: 24 mL/min — ABNORMAL LOW (ref >60)
Glucose, Bld: 229 mg/dL — ABNORMAL HIGH (ref 70–99)
Potassium: 3.5 mmol/L (ref 3.5–5.1)
Sodium: 142 mmol/L (ref 135–145)
Total Bilirubin: 0.8 mg/dL (ref 0.3–1.2)
Total Protein: 4.7 g/dL — ABNORMAL LOW (ref 6.5–8.1)

## 2018-08-07 LAB — GLUCOSE, CAPILLARY
Glucose-Capillary: 201 mg/dL — ABNORMAL HIGH (ref 70–99)
Glucose-Capillary: 222 mg/dL — ABNORMAL HIGH (ref 70–99)
Glucose-Capillary: 192 mg/dL — ABNORMAL HIGH (ref 70–99)
Glucose-Capillary: 171 mg/dL — ABNORMAL HIGH (ref 70–99)

## 2018-08-07 LAB — CBC WITH DIFFERENTIAL/PLATELET
Abs Immature Granulocytes: 0.12 10*3/uL — ABNORMAL HIGH (ref 0.00–0.07)
Basophils Absolute: 0 10*3/uL (ref 0.0–0.1)
Basophils Relative: 0 %
Eosinophils Absolute: 0 10*3/uL (ref 0.0–0.5)
Eosinophils Relative: 0 %
HCT: 34.5 % — ABNORMAL LOW (ref 36.0–46.0)
Hemoglobin: 10.8 g/dL — ABNORMAL LOW (ref 12.0–15.0)
Immature Granulocytes: 1 %
Lymphocytes Relative: 13 %
Lymphs Abs: 1.5 10*3/uL (ref 0.7–4.0)
MCH: 29 pg (ref 26.0–34.0)
MCHC: 31.3 g/dL (ref 30.0–36.0)
MCV: 92.5 fL (ref 80.0–100.0)
Monocytes Absolute: 0.7 10*3/uL (ref 0.1–1.0)
Monocytes Relative: 6 %
Neutro Abs: 9 10*3/uL — ABNORMAL HIGH (ref 1.7–7.7)
Neutrophils Relative %: 80 %
Platelets: 194 10*3/uL (ref 150–400)
RBC: 3.73 MIL/uL — ABNORMAL LOW (ref 3.87–5.11)
RDW: 15.7 % — ABNORMAL HIGH (ref 11.5–15.5)
WBC: 11.4 10*3/uL — ABNORMAL HIGH (ref 4.0–10.5)
nRBC: 0 % (ref 0.0–0.2)

## 2018-08-07 MED ORDER — PRO-STAT SUGAR FREE PO LIQD
30.0000 mL | Freq: Two times a day (BID) | ORAL | Status: DC
  Administered 2018-08-07 – 2018-08-22 (×24): 30 mL via ORAL
  Filled 2018-08-07 (×29): qty 30

## 2018-08-07 MED ORDER — FUROSEMIDE 40 MG PO TABS
40.0000 mg | ORAL_TABLET | Freq: Every day | ORAL | Status: DC
  Administered 2018-08-08 – 2018-08-09 (×2): 40 mg via ORAL
  Filled 2018-08-07 (×2): qty 1

## 2018-08-07 MED ORDER — PANTOPRAZOLE SODIUM 40 MG PO TBEC
40.0000 mg | DELAYED_RELEASE_TABLET | Freq: Every day | ORAL | Status: DC
  Administered 2018-08-07 – 2018-08-23 (×17): 40 mg via ORAL
  Filled 2018-08-07 (×17): qty 1

## 2018-08-07 MED ORDER — ACETAZOLAMIDE SODIUM 500 MG IJ SOLR
250.0000 mg | Freq: Once | INTRAMUSCULAR | Status: AC
  Administered 2018-08-07: 16:00:00 250 mg via INTRAVENOUS
  Filled 2018-08-07: qty 250

## 2018-08-07 MED ORDER — POTASSIUM CHLORIDE CRYS ER 20 MEQ PO TBCR
20.0000 meq | EXTENDED_RELEASE_TABLET | Freq: Two times a day (BID) | ORAL | Status: DC
  Administered 2018-08-07: 21:00:00 20 meq via ORAL
  Filled 2018-08-07 (×2): qty 1

## 2018-08-07 MED ORDER — SODIUM CHLORIDE 0.9 % IV SOLN
INTRAVENOUS | Status: AC
  Administered 2018-08-07: 17:00:00 via INTRAVENOUS

## 2018-08-07 NOTE — Progress Notes (Signed)
Occupational Therapy Session Note  Patient Details  Name: Shelby Walker MRN: [id number] Date of Birth: 1933/08/26  Today's Date: 08/07/2018 OT Individual Time: 0211-1552 OT Individual Time Calculation (min): 37 min  and Today's Date: 08/07/2018 OT Missed Time: 23 Minutes Missed Time Reason: Patient ill (comment)   Short Term Goals: Week 1:  OT Short Term Goal 1 (Week 1): Patient will completed UB and LB bathing and dressing at Supervision level using LRAD. OT Short Term Goal 2 (Week 1): Patient will complete toilet transfers at Supervision with DME if needed.  OT Short Term Goal 3 (Week 1): Patient will increase sitting tolerance by completing a task for 15 minutes or more in order to participate in ADL with less rest breaks. OT Short Term Goal 4 (Week 1): Patient will complete simulated walk-in shower transfer at Supervision level with Shower seat if needed. OT Short Term Goal 5 (Week 1): Patient will increase BUE strength and endurance to 4-/5 to increase participation in ADL tasks and functional transfers.   Skilled Therapeutic Interventions/Progress Updates:   Pt received in recliner, stating her back pain started today and she was feeling nauseated. Provided pt with ginger ale to sip on.  Pt sat up in recliner and felt a little dizzier.  Pt was able to do 2 sets of light exercises of AROM of UEs.  She then began to vomit a considerable amount. RN aware.  Pt stopped vomitting but then was not feeling well. Reclined pt in chair and placed hot packs (disposable) behind her back, belt alarm on and all needs met.    Therapy Documentation Precautions:  Precautions Precautions: Fall Precaution Comments: left knee buckles Restrictions Weight Bearing Restrictions: No  Pain: Pain Assessment Pain Scale: Faces Pain Score: 9  Faces Pain Scale: Hurts even more Pain Type: Acute pain Pain Location: Back Pain Orientation: Mid;Lower Pain Descriptors / Indicators: Aching Pain Frequency:  Constant Pain Onset: On-going Patients Stated Pain Goal: 3 Pain Intervention(s): RN made aware  Therapy/Group: Individual Therapy  [place] 08/07/2018, 10:37 AM

## 2018-08-07 NOTE — Progress Notes (Signed)
[facility] PHYSICAL MEDICINE & REHABILITATION PROGRESS NOTE  Subjective/Complaints: Patient seen laying in bed this morning.  She states she slept well overnight.  She states she is ready begin therapies today.  ROS: Denies CP, shortness of breath, nausea, vomiting, diarrhea.  Objective: Vital Signs: Blood pressure (!) 128/51, pulse 62, resp. rate 16, SpO2 99 %. No results found. Recent Labs    08/07/18 0328  WBC 11.4*  HGB 10.8*  HCT 34.5*  PLT 194   Recent Labs    08/06/18 0551 08/07/18 0328  NA 141 142  K 3.6 3.5  CL 88* 88*  CO2 40* 40*  GLUCOSE 213* 229*  BUN 74* 68*  CREATININE 1.91* 1.88*  CALCIUM 7.7* 8.0*    Physical Exam: BP (!) 128/51 (BP Location: Right Arm)   Pulse 62   Resp 16   SpO2 99%  Constitutional: No distress . Vital signs reviewed. HENT: Normocephalic.  Atraumatic. Eyes: EOMI. No discharge. Cardiovascular: No JVD. Respiratory: Normal effort.  + [place]. GI: Non-distended. Musc: Joint deformities due to RA Neurological: She is alert.  Alert female no acute distress.   Follows commands.   HOH Motor: Right upper extremity: 5/5 proximal distal, unchanged Right lower extremity: 4-4+/5 proximal distal, unchanged Left upper extremity: 4-4+/5 proximal distal Left lower extremity: 2+/5 proximal to distal, unchanged  Skin: Skin is warm and dry.  Psychiatric: Her affect is blunt. Her speech is delayed. She is slowed.    Assessment/Plan: 1. Functional deficits secondary to debility which require 3+ hours per day of interdisciplinary therapy in a comprehensive inpatient rehab setting.  Physiatrist is providing close team supervision and 24 hour management of active medical problems listed below.  Physiatrist and rehab team continue to assess barriers to discharge/monitor patient progress toward functional and medical goals  Care Tool:  Bathing              Bathing assist       Upper Body Dressing/Undressing Upper body dressing   What  is the patient wearing?: Hospital gown only    Upper body assist Assist Level: Minimal Assistance - Patient > 75%    Lower Body Dressing/Undressing Lower body dressing      What is the patient wearing?: Underwear/pull up(mesh panties)     Lower body assist Assist for lower body dressing: Moderate Assistance - Patient 50 - 74%     Toileting Toileting    Toileting assist Assist for toileting: Moderate Assistance - Patient 50 - 74%     Transfers Chair/bed transfer  Transfers assist           Locomotion Ambulation   Ambulation assist              Walk 10 feet activity   Assist           Walk 50 feet activity   Assist           Walk 150 feet activity   Assist           Walk 10 feet on uneven surface  activity   Assist           Wheelchair     Assist               Wheelchair 50 feet with 2 turns activity    Assist            Wheelchair 150 feet activity     Assist            Medical  Problem List and Plan: 1.  Debility secondary to septic shock due to C. difficile colitis.  Complete course of vancomycin x4 more days.  Contact precautions  Begin CIR evaluations 2.  Antithrombotics: -DVT/anticoagulation: Eliquis             -antiplatelet therapy: N/A 3. Pain Management: Oxycodone as needed 4. Mood: Provide emotional support             -antipsychotic agents: N/A 5. Neuropsych: This patient is ?  Fully capable of making decisions on her own behalf. 6. Skin/Wound Care: Routine skin checks 7. Fluids/Electrolytes/Nutrition: Routine in and outs 8.  New onset PAF.  Lopressor 12.5 mg twice daily.  Continue Eliquis.  Cardiac rate controlled             Rate controlled on 6/4  Monitor with increased mobility 9.    Steroid-induced hyperglycemia on diabetes mellitus.  Hemoglobin A1c 8.8.  Sliding scale insulin.  Monitor with increased mobility 10.  Rheumatoid arthritis.  Patient on Enbrel 50 mg injection  weekly and can be resumed as outpatient.  Continue chronic prednisone 5 mg daily 11.  AKI.  Responding well to Lasix and plan 80 mg daily x30 days.  Follow-up renal services             Creatinine 1.88 on 6/4  Encourage fluids  Continue to monitor 12.  Hypoalbuminemia  Supplement initiated on 6/4 13.  Leukocytosis-likely steroid-induced +/- C. difficile  WBCs 11.4 on 6/4  Afebrile  Continue to monitor 14.?  Acute blood loss anemia  Hemoglobin 12.8 on 6/4  Continue to monitor   LOS: 1 days A FACE TO FACE EVALUATION WAS PERFORMED  Ankit Lorie Phenix 08/07/2018, 10:58 AM

## 2018-08-07 NOTE — Progress Notes (Signed)
Patient ID: Shelby Walker, female   DOB: [birth year], 85 y.o.   MRN: [id number]  [facility] Progress Note   Assessment/ Plan:   1. Acute kidney Injury on chronic kidney disease stage III: Labs show stable creatinine overnight but no charted I/O or weights--will re-order to have these in order to guide optimal management/diuretic therapy. 2.  Volume overload: Responding well to diuresis-based on labs/symptomatic assessment, will continue to adjust her diuretic doses based on labs/assessment of input/output and weight. 3.  C. difficile infection/colitis: Continues to improve well on oral vancomycin 4.  Hypokalemia/metabolic alkalosis: Secondary to GI losses and now diuresis/contraction alkalosis.  I will re-order acetazolamide today and will reduce furosemide to 40mg  daily. Will give potassium replacement.   Subjective:   She reports that she is tired after difficulty sleeping overnight.  Denies any chest pain or shortness of breath.   Objective:   BP (!) 128/51 (BP Location: Right Arm)   Pulse 62   Resp 16   SpO2 99%  No intake or output data in the 24 hours ending 08/07/18 1003 Weight change:   Physical Exam: Gen: Comfortably sitting up in recliner CVS: Pulse regular rhythm, normal rate, S1 and S2 normal Resp: Diminished breath sounds over bases-poor inspiratory effort, no distinct rales Abd: Soft, obese, nontender Ext: 1+ left lower extremity edema, 1+ right lower extremity edema  Imaging: No results found.  Labs: BMET Recent Labs  Lab 08/01/18 0532 08/02/18 0263 08/03/18 0417 08/04/18 0442 08/05/18 0557 08/06/18 0551 08/07/18 0328  NA 140 141 141 139 142 141 142  K 4.1 3.6 3.6 3.4* 3.6 3.6 3.5  CL 100 97* 95* 89* 87* 88* 88*  CO2 27 29 31  36* 40* 40* 40*  GLUCOSE 172* 96 172* 209* 203* 213* 229*  BUN 65* 70* 70* 73* 74* 74* 68*  CREATININE 2.88* 2.77* 2.58* 2.52* 2.12* 1.91* 1.88*  CALCIUM 7.3* 7.3* 7.3* 7.4* 7.7* 7.7* 8.0*  PHOS  --   --   --   --  4.8*  3.8  --    CBC Recent Labs  Lab 08/03/18 0417 08/07/18 0328  WBC 12.0* 11.4*  NEUTROABS 9.6* 9.0*  HGB 11.3* 10.8*  HCT 35.8* 34.5*  MCV 92.5 92.5  PLT 184 194    Medications:    . apixaban  2.5 mg Oral BID  . atorvastatin  20 mg Oral q1800  . feeding supplement  1 Container Oral BID BM  . feeding supplement (ENSURE ENLIVE)  237 mL Oral BID BM  . furosemide  80 mg Oral Daily  . Gerhardt's butt cream   Topical BID  . insulin aspart  0-9 Units Subcutaneous TID WC  . metoprolol tartrate  12.5 mg Oral BID  . multivitamin with minerals  1 tablet Oral Daily  . nystatin   Topical BID  . predniSONE  5 mg Oral Q breakfast  . vancomycin  125 mg Oral QID   Elmarie Shiley, MD 08/07/2018, 10:03 AM

## 2018-08-07 NOTE — Evaluation (Signed)
Physical Therapy Assessment and Plan  Patient Details  Name: LENEE FRANZE MRN: [id number] Date of Birth: 08/08/1933  PT Diagnosis: Abnormality of gait, Difficulty walking, Low back pain and Muscle weakness Rehab Potential: Good ELOS: 3-3.5 weeks   Today's Date: 08/07/2018 PT Individual Time: 1512-1600 PT Individual Time Calculation (min): 48 min    Problem List:  Patient Active Problem List   Diagnosis Date Noted  . Acute blood loss anemia   . Leukocytosis   . Hypoalbuminemia due to protein-calorie malnutrition ([place])   . Debility 08/06/2018  . AKI (acute kidney injury) ([place])   . Diabetes mellitus type 2 in nonobese ([facility])   . New onset atrial fibrillation ([place])   . Shock ([place]) 07/30/2018  . Diverticulitis 07/24/2018  . Colitis presumed infectious 07/24/2018  . Colitis 07/24/2018  . Clostridium difficile colitis 07/24/2018  . Mixed hyperlipidemia 09/29/2016  . Stable angina ([place]) 09/29/2016  . Claudication in peripheral vascular disease ([place]) 08/09/2016  . Preoperative clearance 05/21/2012  . Precordial chest pain   . Palpitations   . Diabetes mellitus ([place])   . GERD (gastroesophageal reflux disease)   . Ejection fraction   . Carotid artery disease ([place])   . Mitral regurgitation 04/21/2009  . PSORIASIS 02/09/2009  . Rheumatoid arthritis ([place]) 02/09/2009    Past Medical History:  Past Medical History:  Diagnosis Date  . Arthritis, rheumatoid ([place])   . Bruit    Carotid Doppler showed no significant abnormality     9per patient)  . Chest pain, unspecified    Nuclear, May, 2008, no scar or ischemia  . Diabetes mellitus   . Dyslipidemia   . Ejection fraction    EF 55-60%, echo, February, 2011  . GERD (gastroesophageal reflux disease)   . Mitral regurgitation April 21, 2009   mild,  echo, February, 2011  . Osteoporosis   . Palpitations    possible very brief atrial fibrillation on monitor and possible reentrant tachycardia  . Psoriasis   . Rheumatoid arthritis  [facility])    Past Surgical History:  Past Surgical History:  Procedure Laterality Date  . FRACTURE SURGERY      Assessment & Plan Clinical Impression: Patient is a 85 y.o. year old female with recent admission to the hospital on 07/24/2018 with nausea, vomiting, diarrhea. She was recently seen at urgent care diagnosed with infectious colitis and completed 7-day course of Cipro and Flagyl without improvement. She was noted to have SIRS on evaluation in the ED. Noted fever of 100.5, heart rate 104, WBC 15,300, BUN 23, creatinine 1.22. Urinalysis negative nitrite, COVID negative, troponin negative. Chest x-ray unremarkable. CT abdomen pelvis noted diffuse colitis without perforation or abscess. C. difficile specimen was positive and maintained on vancomycin with contact precautions. Hospital course complicated by PAF, placed on amiodarone converted back to sinus rhythm. Echocardiogram with ejection fraction of 65%. Eliquis was initiated for PAF as well as Lopressor. Renal services consulted 07/30/2018 for elevated creatinine 3.98 from baseline 1.22. Renal ultrasound showed no hydronephrosis. There was some new mild to moderate abdominal ascites. Patient responded well to Lasix therapy and latest creatinine 1.91.  Patient transferred to CIR on 08/06/2018 .    Patient currently requires mod with mobility secondary to muscle weakness and back pain and decreased cardiorespiratoy endurance and orthostatic hypotension.  Prior to hospitalization, patient was modified independent  with mobility and lived with Spouse in a House home.  Home access is 3Stairs to enter.  Patient will benefit from skilled PT intervention to maximize  safe functional mobility, minimize fall risk and decrease caregiver burden for planned discharge home with 24 hour supervision.  Anticipate patient will benefit from follow up [place] at discharge.  PT - End of Session Activity Tolerance: Decreased this session;Tolerates 10 - 20 min  activity with multiple rests Endurance Deficit: Yes Endurance Deficit Description: limited upright tolerance with positive orthostatic testing PT Assessment Rehab Potential (ACUTE/IP ONLY): Good PT Barriers to Discharge: Decreased caregiver support;Medical stability PT Barriers to Discharge Comments: spouse with heart condition per pt with defibrillator; has significant orthostatic hypotension with continued diuresis planned PT Patient demonstrates impairments in the following area(s): Balance;Motor;Pain;Safety;Endurance PT Transfers Functional Problem(s): Bed Mobility;Bed to Chair;Car;Furniture PT Locomotion Functional Problem(s): Ambulation;Wheelchair Mobility;Stairs PT Plan PT Intensity: Minimum of 1-2 x/day ,45 to 90 minutes PT Frequency: 5 out of 7 days PT Duration Estimated Length of Stay: 3-3.5 weeks PT Treatment/Interventions: Ambulation/gait training;Balance/vestibular training;Functional mobility training;Patient/family education;Therapeutic Exercise;Therapeutic Activities;Stair training;DME/adaptive equipment instruction;Wheelchair propulsion/positioning;UE/LE Strength taining/ROM PT Transfers Anticipated Outcome(s): mod I PT Locomotion Anticipated Outcome(s): S with AD PT Recommendation Follow Up Recommendations: Home health PT;24 hour supervision/assistance Patient destination: Home Equipment Recommended: Rolling walker with 5" wheels  Skilled Therapeutic Intervention Patient in supine and reports not feeling well.  Supine for interview and LE strength/sensory testing.  Supine to sit min A with rail and pt seated to comb hair.  Sit to stand to RW mod A increased time and cues for hand placement.  Ambulated 2 steps forward c/o nausea so 2 steps back to bed.  Patient recovered seated and stand pivot to w/c mod A no device.  Patient in w/c propelled about 66' with S then assisted to gym on unit.  Checked orthostatic v/s and noted as below.  Patient in w/c assisted to room and stand  step to bed with RW and min to mod A.  Sit to supine min a for L LE.  Checked BP supine and pt educated in plans for continued rehab and to discuss medical issues with PA.  Left in supine with call bell in reach and bed alarm activated.   Orthostatic VS for the past 24 hrs (Last 3 readings):  BP- Lying Pulse- Lying BP- Sitting Pulse- Sitting BP- Standing at 0 minutes Pulse- Standing at 0 minutes  08/07/18 1500 131/49 62 111/52 66 (!) 77/56 74    PT Evaluation Precautions/Restrictions Precautions Precautions: Fall Precaution Comments: left knee buckles General PT Amount of Missed Time (min): 12 Minutes PT Missed Treatment Reason: Nursing care;Toileting  Pain Pain Assessment Pain Score: 8  Pain Type: Acute pain Pain Location: Back Pain Orientation: Lower;Medial Pain Descriptors / Indicators: Aching Pain Onset: With Activity Pain Intervention(s): Repositioned;Heat applied Home Living/Prior Functioning Home Living Available Help at Discharge: Family(spouse has heart condition, states daughter can help) Type of Home: House Home Access: Stairs to enter [employer] of Steps: 3 Entrance Stairs-Rails: None Home Layout: Able to live on main level with bedroom/bathroom;Laundry or work area in basement(flight of stairs to basement) Bathroom Shower/Tub: Tub/shower unit;Walk-in shower(uses walk in) [employer]: Standard Additional Comments: cane, 2 wheeled walker  Lives With: Spouse Prior Function Level of Independence: Independent with basic ADLs;Independent with gait;Independent with homemaking with ambulation  Able to Take Stairs?: Yes Driving: Yes 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 Overall Cognitive Status: Within Functional Limits for tasks assessed Arousal/Alertness: Awake/alert Orientation Level: Oriented X4 Attention: Sustained Memory: Appears intact Safety/Judgment: Appears intact Sensation Sensation Light Touch: Impaired by gross  assessment(decreased on feet to lighy touch) Motor  Motor Motor: Within Functional Limits Motor - Skilled Clinical Observations: generalized weakness  Mobility Bed Mobility Bed Mobility: Supine to Sit;Sit to Supine;Rolling Left Rolling Left: Supervision/Verbal cueing Sit to Supine: Minimal Assistance - Patient > 75% Transfers Transfers: Sit to Stand;Stand to Sit;Stand Pivot Transfers Sit to Stand: Moderate Assistance - Patient 50-74% Stand to Sit: Moderate Assistance - Patient 50-74% Stand Pivot Transfers: Moderate Assistance - Patient 50 - 74% Stand Pivot Transfer Details: Verbal cues for technique;Verbal cues for precautions/safety Transfer (Assistive device): Rolling walker Locomotion  Gait Ambulation: Yes Gait Assistance: Moderate Assistance - Patient 50-74% Gait Distance (Feet): 2 Feet Assistive device: Rolling walker Gait Assistance Details: Verbal cues for precautions/safety;Verbal cues for safe use of DME/AE Gait Assistance Details: 2 steps forward, 2 steps back with RW due to nausea, dizziness cues for keeping walker close Stairs / Additional Locomotion Stairs: No Wheelchair Mobility Wheelchair Mobility: Yes Wheelchair Assistance: Chartered loss adjuster: Both upper extremities Distance: 30'  Trunk/Postural Assessment  Cervical Assessment Cervical Assessment: Exceptions to WFL(forward head) Thoracic Assessment Thoracic Assessment: Exceptions to WFL(rounded shoulders) Lumbar Assessment Lumbar Assessment: Exceptions to WFL(PPT) Postural Control Postural Control: Deficits on evaluation Postural Limitations: generalized weakness and decreased postural awareness  Balance Balance Balance Assessed: Yes Dynamic Sitting Balance Dynamic Sitting - Balance Support: Feet supported Dynamic Sitting - Level of Assistance: 5: Stand by assistance Dynamic Sitting Balance - Compensations: sitting brushing hair Dynamic Standing Balance Dynamic Standing -  Balance Support: Bilateral upper extremity supported Dynamic Standing - Level of Assistance: 3: Mod assist Dynamic Standing - Balance Activities: Other (comment) Dynamic Standing - Comments: stepping Extremity Assessment      RLE Assessment RLE Assessment: Exceptions to [facility] Active Range of Motion (AROM) Comments: AROM WFL General Strength Comments: Hip flexion 3-/5, knee ext 4-/5, ankle DF 4/5, knee flexion 3+/5 LLE Assessment LLE Assessment: Exceptions to [facility] Active Range of Motion (AROM) Comments: WFL except hip flexion due to weakness PROM [facility] General Strength Comments: Hip flexion 2/5, knee extension 3+/5, ankle DF4-/5, knee flexion 3/5    Refer to Care Plan for Long Term Goals  Recommendations for other services: Therapeutic Recreation  Other co-tx   Discharge Criteria: Patient will be discharged from PT if patient refuses treatment 3 consecutive times without medical reason, if treatment goals not met, if there is a change in medical status, if patient makes no progress towards goals or if patient is discharged from hospital.  The above assessment, treatment plan, treatment alternatives and goals were discussed and mutually agreed upon: by patient  Jamison Oka, PT 08/07/2018, 4:22 PM

## 2018-08-07 NOTE — Progress Notes (Signed)
Patient information reviewed and entered into [facility] System by Becky Reza Crymes, PPS coordinator. Information including medical coding, function ability, and quality indicators will be reviewed and updated through discharge.  During the Covid 19 public health emergency, the inpatient rehabilitation unit of the [facility] will use acute care beds on another unit to provide each patient with a private room. This effort is to assure proper infection control and to optimize care management during this public health emergency.     

## 2018-08-07 NOTE — Evaluation (Addendum)
Occupational Therapy Assessment and Plan  Patient Details  Name: Shelby Walker MRN: [id number] Date of Birth: 04/02/1933  OT Diagnosis: muscle weakness (generalized) Rehab Potential: Rehab Potential (ACUTE ONLY): Good ELOS:     Today's Date: 08/07/2018 OT Individual Time: 3716-9678 OT Individual Time Calculation (min): 75 min     Problem List:  Patient Active Problem List   Diagnosis Date Noted  . Debility 08/06/2018  . AKI (acute kidney injury) ([place])   . Diabetes mellitus type 2 in nonobese (H[facility])   . New onset atrial fibrillation ([place])   . Shock ([place]) 07/30/2018  . Diverticulitis 07/24/2018  . Colitis presumed infectious 07/24/2018  . Colitis 07/24/2018  . Clostridium difficile colitis 07/24/2018  . Mixed hyperlipidemia 09/29/2016  . Stable angina ([place]) 09/29/2016  . Claudication in peripheral vascular disease ([place]) 08/09/2016  . Preoperative clearance 05/21/2012  . Precordial chest pain   . Palpitations   . Diabetes mellitus ([place])   . GERD (gastroesophageal reflux disease)   . Ejection fraction   . Carotid artery disease ([place])   . Mitral regurgitation 04/21/2009  . PSORIASIS 02/09/2009  . Rheumatoid arthritis ([place]) 02/09/2009    Past Medical History:  Past Medical History:  Diagnosis Date  . Arthritis, rheumatoid ([place])   . Bruit    Carotid Doppler showed no significant abnormality     9per patient)  . Chest pain, unspecified    Nuclear, May, 2008, no scar or ischemia  . Diabetes mellitus   . Dyslipidemia   . Ejection fraction    EF 55-60%, echo, February, 2011  . GERD (gastroesophageal reflux disease)   . Mitral regurgitation April 21, 2009   mild,  echo, February, 2011  . Osteoporosis   . Palpitations    possible very brief atrial fibrillation on monitor and possible reentrant tachycardia  . Psoriasis   . Rheumatoid arthritis [facility])    Past Surgical History:  Past Surgical History:  Procedure Laterality Date  . FRACTURE SURGERY      Assessment &  Plan Clinical Impression: Patient is a 85 y.o. year old female with recent admission to the hospital on 07/24/2018 with nausea, vomiting, diarrhea.  She was recently seen at urgent care diagnosed with infectious colitis and completed 7-day course of Cipro and Flagyl without improvement.  She was noted to have SIRS on evaluation in the ED.  Noted fever of 100.5, heart rate 104, WBC 15,300, BUN 23, creatinine 1.22.  Urinalysis negative nitrite, COVID negative, troponin negative.  Chest x-ray unremarkable.  CT abdomen pelvis noted diffuse colitis without perforation or abscess.  C. difficile specimen was positive and maintained on vancomycin with contact precautions.  Hospital course complicated by PAF, placed on amiodarone converted back to sinus rhythm.  Echocardiogram with ejection fraction of 65%.  Eliquis was initiated for PAF as well as Lopressor.  Renal services consulted 07/30/2018 for elevated creatinine 3.98 from baseline 1.22.  Renal ultrasound showed no hydronephrosis.  There was some new mild to moderate abdominal ascites.  Patient responded well to Lasix therapy and latest creatinine 1.91.  Patient transferred to CIR on 08/06/2018 .    Patient currently requires min with basic self-care skills secondary to muscle weakness.  Prior to hospitalization, patient could complete IADL and ADL tasks with independent .  Patient will benefit from skilled intervention to increase independence with basic self-care skills prior to discharge home with care partner.  Anticipate patient will require No supervision and follow up home health.  OT - End of Session  Activity Tolerance: Decreased this session;Tolerates < 10 min activity, no significant change in vital signs Endurance Deficit: Yes Endurance Deficit Description: After transfering to toilet, patient reports that she did not have the strength to continue with bathing and dressing.  OT Assessment Rehab Potential (ACUTE ONLY): Good OT Patient demonstrates  impairments in the following area(s): Balance;Pain;Safety OT Basic ADL's Functional Problem(s): Grooming;Bathing;Dressing;Toileting OT Advanced ADL's Functional Problem(s): Light Housekeeping OT Transfers Functional Problem(s): Toilet;Tub/Shower OT Plan OT Intensity: Minimum of 1-2 x/day, 45 to 90 minutes OT Frequency: 5 out of 7 days Length of stay: 5-7 days OT Treatment/Interventions: Balance/vestibular training;Neuromuscular re-education;Self Care/advanced ADL retraining;Therapeutic Exercise;UE/LE Strength taining/ROM;Pain management;DME/adaptive equipment instruction;Patient/family education;Therapeutic Activities;Functional mobility training;Discharge planning OT Self Feeding Anticipated Outcome(s): Mod I OT Basic Self-Care Anticipated Outcome(s): Mod I OT Toileting Anticipated Outcome(s): Mod I OT Bathroom Transfers Anticipated Outcome(s): Mod I OT Recommendation Recommendations for Other Services: Therapeutic Recreation consult Patient destination: Home Follow Up Recommendations: Home health OT;Other (comment)(TBD) Equipment Recommended: 3 in 1 bedside comode;Tub/shower seat   Skilled Therapeutic Intervention Pt in bed upon therapy arrival. Breakfast tray was brought and assisted patient to sit on EOB to eat. Patient presented with low back pain and decreased sitting tolerance as she requested pain medication and to lay down. Encouraged patient to sit in wheelchair to complete bathing and dressing with assist from therapist. Patient completed toilet transfer and once there stated that she was too fatigued to complete anything else. Bathing and dressing was then completed while seated on the toilet with increased assistance due to fatigue and decreased activity tolerance. Patient was initially on oxygen. It was removed and patient's oxygen stats were 96% on room air. Once bathing and dressing were complete patient was transferred to the recliner, seat belt was placed and call light within  reach. Patient presents with decreased activity tolerance, decreased strength and endurance, requiring increased assistance to complete basic ADL tasks and functional transfers. Patient will benefit from skilled OT services to focus on mentioned deficits in order to return home safely with husband.   OT Evaluation Precautions/Restrictions  Precautions Precautions: Fall Precaution Comments: left knee buckles Restrictions Weight Bearing Restrictions: No Pain Pain Assessment Pain Scale: Faces Faces Pain Scale: Hurts even more Pain Type: Acute pain Pain Location: Back Pain Orientation: Mid;Lower Pain Descriptors / Indicators: Grimacing;Aching Pain Frequency: Constant Pain Onset: On-going Patients Stated Pain Goal: 3 Pain Intervention(s): Medication (See eMAR);Distraction;Repositioned;Rest Home Living/Prior Functioning Home Living Family/patient expects to be discharged to:: Private residence Living Arrangements: Spouse/significant other Available Help at Discharge: Other (Comment)(Unknown) Type of Home: House Home Layout: Able to live on main level with bedroom/bathroom, Laundry or work area in Water quality scientist of steps to basement) Bathroom Shower/Tub: Tub/shower unit, Walk-in shower(using mainly walk in) [employer]: Standard Bathroom Accessibility: Yes Additional Comments: cane, 2 wheeled walker  Lives With: Spouse IADL History Mode of Transportation: Car Prior Function Level of Independence: Independent with basic ADLs, Independent with gait  Able to Take Stairs?: Yes Driving: Yes Vocation: Retired ADL ADL Eating: Modified independent Where Assessed-Eating: Edge of bed Grooming: Setup Where Assessed-Grooming: Sitting at sink Upper Body Bathing: Setup Where Assessed-Upper Body Bathing: Sitting at [place]: Minimal assistance Where Assessed-Lower Body Bathing: Sitting at sink Upper Body Dressing: Minimal assistance Where Assessed-Upper Body  Dressing: Sitting at sink Lower Body Dressing: Minimal assistance Where Assessed-Lower Body Dressing: Sitting at [place]: Moderate assistance Where Assessed-Toileting: Glass blower/designer: Moderate assistance Toilet Transfer Method: Stand pivot Vision Baseline Vision/History: Wears glasses Wears Glasses: Reading  only Patient Visual Report: No change from baseline Cognition Overall Cognitive Status: Within Functional Limits for tasks assessed Arousal/Alertness: Awake/alert Orientation Level: Person;Place;Situation Person: Oriented Place: Oriented Situation: Oriented Year: 2020 Month: June Day of Week: Correct Immediate Memory Recall: Sock;Blue;Bed Memory Recall: Bed;Blue(2/3) Memory Recall Blue: With Cue Memory Recall Bed: Without Cue Sensation Sensation Light Touch: Appears Intact Hot/Cold: Appears Intact Proprioception: Appears Intact Stereognosis: Appears Intact Mobility  Bed Mobility Bed Mobility: Supine to Sit Supine to Sit: Minimal Assistance - Patient > 75% Transfers Sit to Stand: Minimal Assistance - Patient > 75%  Trunk/Postural Assessment     Balance Balance Balance Assessed: Yes Static Sitting Balance Static Sitting - Balance Support: No upper extremity supported Static Sitting - Level of Assistance: 5: Stand by assistance Extremity/Trunk Assessment RUE Assessment RUE Assessment: Exceptions to [facility] Passive Range of Motion (PROM) Comments: WFL Active Range of Motion (AROM) Comments: WFL. Arthritc joints in hand General Strength Comments: Shoulder: 3/5, elbow: 3/5, wrist: 3/5, weak gross grasp LUE Assessment LUE Assessment: Exceptions to [facility] Passive Range of Motion (PROM) Comments: WFL Active Range of Motion (AROM) Comments: WFL. Arthritic joints in hand  General Strength Comments: shoulder: 3/5, elbow: 3/5, wrist: 3/5, weak gross grasp     Refer to Care Plan for Long Term Goals  Recommendations for other services: Therapeutic Recreation   Other leisure activies/hobby exploration   Discharge Criteria: Patient will be discharged from OT if patient refuses treatment 3 consecutive times without medical reason, if treatment goals not met, if there is a change in medical status, if patient makes no progress towards goals or if patient is discharged from hospital.  The above assessment, treatment plan, treatment alternatives and goals were discussed and mutually agreed upon: by patient  Ailene Ravel, OTR/L,CBIS  [phone]  08/07/2018, 9:44 AM

## 2018-08-07 NOTE — Progress Notes (Signed)
Patient reported to be significantly orthostatic with inability to tolerate PT this afternoon. Contacted Dr. Elmarie Shiley regarding symptoms--he had decreased her lasix to 40 mg daily because of concerns of over diuresis but patient did get 80 mg likely contributing to changes. He recommended 500 cc fluid for hydration--order written.

## 2018-08-08 ENCOUNTER — Inpatient Hospital Stay (HOSPITAL_COMMUNITY): Payer: Medicare Other

## 2018-08-08 ENCOUNTER — Inpatient Hospital Stay (HOSPITAL_COMMUNITY): Payer: Medicare Other | Admitting: Physical Therapy

## 2018-08-08 ENCOUNTER — Inpatient Hospital Stay (HOSPITAL_COMMUNITY): Payer: Medicare Other | Admitting: Occupational Therapy

## 2018-08-08 DIAGNOSIS — R52 Pain, unspecified: Secondary | ICD-10-CM

## 2018-08-08 DIAGNOSIS — E876 Hypokalemia: Secondary | ICD-10-CM

## 2018-08-08 DIAGNOSIS — T380X5A Adverse effect of glucocorticoids and synthetic analogues, initial encounter: Secondary | ICD-10-CM

## 2018-08-08 DIAGNOSIS — R739 Hyperglycemia, unspecified: Secondary | ICD-10-CM

## 2018-08-08 LAB — RENAL FUNCTION PANEL
Albumin: 2.2 g/dL — ABNORMAL LOW (ref 3.5–5.0)
Anion gap: 12 (ref 5–15)
BUN: 61 mg/dL — ABNORMAL HIGH (ref 8–23)
CO2: 38 mmol/L — ABNORMAL HIGH (ref 22–32)
Calcium: 7.9 mg/dL — ABNORMAL LOW (ref 8.9–10.3)
Chloride: 93 mmol/L — ABNORMAL LOW (ref 98–111)
Creatinine, Ser: 1.72 mg/dL — ABNORMAL HIGH (ref 0.44–1.00)
GFR calc Af Amer: 31 mL/min — ABNORMAL LOW (ref >60)
GFR calc non Af Amer: 27 mL/min — ABNORMAL LOW (ref >60)
Glucose, Bld: 189 mg/dL — ABNORMAL HIGH (ref 70–99)
Phosphorus: 3.5 mg/dL (ref 2.5–4.6)
Potassium: 3.4 mmol/L — ABNORMAL LOW (ref 3.5–5.1)
Sodium: 143 mmol/L (ref 135–145)

## 2018-08-08 LAB — MAGNESIUM: Magnesium: 2.1 mg/dL (ref 1.7–2.4)

## 2018-08-08 LAB — GLUCOSE, CAPILLARY
Glucose-Capillary: 197 mg/dL — ABNORMAL HIGH (ref 70–99)
Glucose-Capillary: 274 mg/dL — ABNORMAL HIGH (ref 70–99)
Glucose-Capillary: 229 mg/dL — ABNORMAL HIGH (ref 70–99)
Glucose-Capillary: 275 mg/dL — ABNORMAL HIGH (ref 70–99)

## 2018-08-08 MED ORDER — POTASSIUM CHLORIDE CRYS ER 20 MEQ PO TBCR
30.0000 meq | EXTENDED_RELEASE_TABLET | Freq: Once | ORAL | Status: AC
  Administered 2018-08-08: 30 meq via ORAL
  Filled 2018-08-08: qty 1

## 2018-08-08 MED ORDER — POTASSIUM CHLORIDE CRYS ER 20 MEQ PO TBCR
20.0000 meq | EXTENDED_RELEASE_TABLET | Freq: Every day | ORAL | Status: DC
  Administered 2018-08-08 – 2018-08-09 (×2): 20 meq via ORAL
  Filled 2018-08-08 (×3): qty 1

## 2018-08-08 NOTE — Discharge Instructions (Addendum)
Inpatient Rehab Discharge Instructions  Shelby Walker Discharge date and time: No discharge date for patient encounter.   Activities/Precautions/ Functional Status: Activity: activity as tolerated Diet: diabetic diet Wound Care: none needed Functional status:  ___ No restrictions     ___ Walk up steps independently ___ 24/7 supervision/assistance   ___ Walk up steps with assistance ___ Intermittent supervision/assistance  ___ Bathe/dress independently ___ Walk with walker     _x__ Bathe/dress with assistance ___ Walk Independently    ___ Shower independently ___ Walk with assistance    ___ Shower with assistance ___ No alcohol     ___ Return to work/school ________  COMMUNITY REFERRALS UPON DISCHARGE:   Home Health:   PT     OT  Agency:  [facility] Phone:  [phone] Medical Equipment/Items Ordered:  Youth rolling walker and bedside commode  Agency/Supplier:  [facility]       Phone:  [phone]  Special Instructions: No driving smoking or alcohol   My questions have been answered and I understand these instructions. I will adhere to these goals and the provided educational materials after my discharge from the hospital.  Patient/Caregiver Signature _______________________________ Date __________  Clinician Signature _______________________________________ Date __________  Please bring this form and your medication list with you to all your follow-up doctor's appointments.    Information on my medicine - ELIQUIS (apixaban)  Why was Eliquis prescribed for you? Eliquis was prescribed for you to reduce the risk of a blood clot forming that can cause a stroke if you have a medical condition called atrial fibrillation (a type of irregular heartbeat).  What do You need to know about Eliquis ? Take your Eliquis TWICE DAILY - one tablet in the morning and one tablet in the evening with or without food. If you have difficulty swallowing the tablet whole please  discuss with your pharmacist how to take the medication safely.  Take Eliquis exactly as prescribed by your doctor and DO NOT stop taking Eliquis without talking to the doctor who prescribed the medication.  Stopping may increase your risk of developing a stroke.  Refill your prescription before you run out.  After discharge, you should have regular check-up appointments with your healthcare provider that is prescribing your Eliquis.  In the future your dose may need to be changed if your kidney function or weight changes by a significant amount or as you get older.  What do you do if you miss a dose? If you miss a dose, take it as soon as you remember on the same day and resume taking twice daily.  Do not take more than one dose of ELIQUIS at the same time to make up a missed dose.  Important Safety Information A possible side effect of Eliquis is bleeding. You should call your healthcare provider right away if you experience any of the following: ? Bleeding from an injury or your nose that does not stop. ? Unusual colored urine (red or dark brown) or unusual colored stools (red or black). ? Unusual bruising for unknown reasons. ? A serious fall or if you hit your head (even if there is no bleeding).  Some medicines may interact with Eliquis and might increase your risk of bleeding or clotting while on Eliquis. To help avoid this, consult your healthcare provider or pharmacist prior to using any new prescription or non-prescription medications, including herbals, vitamins, non-steroidal anti-inflammatory drugs (NSAIDs) and supplements.  This website has more information on Eliquis (apixaban):  http://www.eliquis.com/eliquis/home

## 2018-08-08 NOTE — Progress Notes (Signed)
Physical Therapy Session Note  Patient Details  Name: [name] MRN: [id number] Date of Birth: 01/25/1934  Today's Date: 08/08/2018 PT Individual Time: 0900-1000; 1350-1420 PT Individual Time Calculation (min): 60 min and 30 min PT Missed Time: 45 min Missed Time Reason: fatigue and low BP  Short Term Goals: Week 1:  PT Short Term Goal 1 (Week 1): Patient to ambulate 35' with RW and mod A PT Short Term Goal 2 (Week 1): Patient to tolerate standing 3 minutes for functional task PT Short Term Goal 3 (Week 1): Patient to initiate stair training. PT Short Term Goal 4 (Week 1): Patient to propel w/c 25' with S.  Skilled Therapeutic Interventions/Progress Updates:    Session 1: Pt received sidelying in bed, reports feeling tired this AM but agreeable to PT session. No complaints of pain. Supine BP 115/30. Applied TEDs and ACE wrap to BLE, supine BP 107/26 with knee-high wraps. Pt is min A for rolling L/R and for briding to don/doff pants so that thigh-high TEDs and ACE wrap can be applied. Supine BP 101/33 with thigh high wraps on. Supine isometric BLE therex. Supine BP 96/42 following therex. Pt left sidelying in bed with needs in reach and bed alarm in place. RN notified of ongoing low BP this AM despite TED hose and ACE wrap in place.  Session 2: Pt received seated in bed working on eating lunch, agreeable to PT session. Pt reports decreased appetite and done attempting to eat. Seated BP 82/46. Transitioned bed into supine position. BLE strengthening therex with AAROM: heel slides, hip abd, SAQ, SLR x 10 reps each. Supine BP following therex 82/42. SpO2 94% and above with activity while on room air. Pt reports feeling significantly fatigued, out of bed mobility deferred 2/2 low BP this PM as well as pt fatigue. Pt left semi-reclined in bed with needs in reach and bed alarm in place. RN notified of ongoing orthostatic BP. Pt missed 45 min of scheduled therapy session due to fatigue and low blood  pressure.  Therapy Documentation Precautions:  Precautions Precautions: Fall Precaution Comments: left knee buckles Restrictions Weight Bearing Restrictions: No    Therapy/Group: Individual Therapy   Excell Seltzer, PT, DPT  08/08/2018, 11:00 AM

## 2018-08-08 NOTE — Progress Notes (Signed)
Occupational Therapy Session Note  Patient Details  Name: Shelby Walker MRN: [id number] Date of Birth: 06/16/1933  Today's Date: 08/08/2018 OT Individual Time: 1100-1200 OT Individual Time Calculation (min): 60 min    Short Term Goals: Week 1:  OT Short Term Goal 1 (Week 1): Patient will completed UB and LB bathing and dressing at Supervision level using LRAD. OT Short Term Goal 2 (Week 1): Patient will complete toilet transfers at Supervision with DME if needed.  OT Short Term Goal 3 (Week 1): Patient will increase sitting tolerance by completing a task for 15 minutes or more in order to participate in ADL with less rest breaks. OT Short Term Goal 4 (Week 1): Patient will complete simulated walk-in shower transfer at Supervision level with Shower seat if needed. OT Short Term Goal 5 (Week 1): Patient will increase BUE strength and endurance to 4-/5 to increase participation in ADL tasks and functional transfers.   Skilled Therapeutic Interventions/Progress Updates:    Pt received in bed and stated that her LLE was hurting at her shin.  Rewrapped ACE wrap slightly looser and she said that helped and her leg was not hurting. Pt laying on kpad and stated the heat also helps her back and it is much better than yesterday. O2 checked on room air and it was only 86%,  Pt did state she felt a little short of breath. Place 1L on pt and after a few minutes it increased to 94%. Pt stated she felt better and did not have to work so hard to breathe.    Pt worked on various bed level exercises as she was still having difficulty with feeling nauseated.  Worked on ankle pumps, heel slides, small bridges, shoulder flexion overhead and reaching arms across midline.  Pt fatigued very quickly and needed to rest a few minutes between sets.   Pt resting in bed with all needs met, alarm set, call light in reach.   Therapy Documentation Precautions:  Precautions Precautions: Fall Precaution Comments: left knee  buckles Restrictions Weight Bearing Restrictions: No    Vital Signs: Oxygen Therapy SpO2: 94 % O2 Device: Room Air O2 Flow Rate (L/min): 1 L/min Patient Activity (if Appropriate): In bed Pain: Pain Assessment Pain Scale: 0-10 Pain Score: 3  Pain Type: Acute pain Pain Location: Back Pain Orientation: Lower;Mid Pain Onset: On-going Pain Intervention(s): Heat applied(kpad)    Therapy/Group: Individual Therapy  SAGUIER,JULIA 08/08/2018, 11:46 AM

## 2018-08-08 NOTE — Progress Notes (Addendum)
Patient ID: Shelby Walker, female   DOB: [birth year], 85 y.o.   MRN: [id number] [facility] Progress Note   Assessment/ Plan:   1. Acute kidney Injury on chronic kidney disease stage III: Labs show stable renal function with some improvement of BUN/metabolic alkalosis following saline and acetazolamide. 2.  Volume overload: Responding well to diuresis-based on labs/symptomatic assessment, adjusted furosemide to 40 mg daily to help augment diuresis.  If she remains orthostatic with pedal edema, she would benefit from compression stockings. 3.  C. difficile infection/colitis: Continues to improve well on oral vancomycin 4.  Hypokalemia/metabolic alkalosis: Secondary to GI losses and now diuresis/contraction alkalosis.   Will replace potassium via oral route.  Subjective:   Treatment yesterday and was found to be orthostatic-500 cc saline bolus given.   Objective:   BP (!) 107/40 (BP Location: Left Arm)   Pulse 66   Temp 97.7 F (36.5 C) (Oral)   Resp 14   Ht 5\' 1"  (1.549 m)   Wt 63.6 kg   SpO2 90%   BMI 26.49 kg/m   Intake/Output Summary (Last 24 hours) at 08/08/2018 1004 Last data filed at 08/07/2018 1904 Gross per 24 hour  Intake 378.66 ml  Output -  Net 378.66 ml   Weight change:   Physical Exam: Gen: Comfortably sleeping in bed CVS: Pulse regular rhythm, normal rate, S1 and S2 normal Resp: Diminished breath sounds over bases-poor inspiratory effort, no distinct rales Abd: Soft, obese, nontender Ext: 1+ bilateral lower extremity edema  Imaging: Dg Lumbar Spine 2-3 Views  Result Date: 08/07/2018 CLINICAL DATA:  Pain EXAM: LUMBAR SPINE - 2-3 VIEW COMPARISON:  CT abdomen and pelvis 07/24/2018 FINDINGS: Osseous demineralization. Five non-rib-bearing lumbar vertebra. Multilevel disc space narrowing and endplate spur formation, greatest at L3-L4 and T11-T12. Facet degenerative changes of the lower lumbar spine. Grade 1 anterolisthesis at L4-L5 likely due to degenerative  disc and facet disease. Vertebral body heights maintained without fracture, additional subluxation, or bone destruction. No spondylolysis. Atherosclerotic calcifications aorta. SI joints preserved. IMPRESSION: Degenerative disc and facet disease changes of the lumbar spine as above. Electronically Signed   By: Lavonia Dana M.D.   On: 08/07/2018 14:56    Labs: BMET Recent Labs  Lab 08/02/18 0558 08/03/18 0417 08/04/18 0442 08/05/18 0557 08/06/18 0551 08/07/18 0328 08/08/18 0140  NA 141 141 139 142 141 142 143  K 3.6 3.6 3.4* 3.6 3.6 3.5 3.4*  CL 97* 95* 89* 87* 88* 88* 93*  CO2 29 31 36* 40* 40* 40* 38*  GLUCOSE 96 172* 209* 203* 213* 229* 189*  BUN 70* 70* 73* 74* 74* 68* 61*  CREATININE 2.77* 2.58* 2.52* 2.12* 1.91* 1.88* 1.72*  CALCIUM 7.3* 7.3* 7.4* 7.7* 7.7* 8.0* 7.9*  PHOS  --   --   --  4.8* 3.8  --  3.5   CBC Recent Labs  Lab 08/03/18 0417 08/07/18 0328  WBC 12.0* 11.4*  NEUTROABS 9.6* 9.0*  HGB 11.3* 10.8*  HCT 35.8* 34.5*  MCV 92.5 92.5  PLT 184 194    Medications:    . apixaban  2.5 mg Oral BID  . atorvastatin  20 mg Oral q1800  . feeding supplement  1 Container Oral BID BM  . feeding supplement (ENSURE ENLIVE)  237 mL Oral BID BM  . feeding supplement (PRO-STAT SUGAR FREE 64)  30 mL Oral BID  . furosemide  40 mg Oral Daily  . Gerhardt's butt cream   Topical BID  . insulin aspart  0-9 Units Subcutaneous TID WC  . metoprolol tartrate  12.5 mg Oral BID  . multivitamin with minerals  1 tablet Oral Daily  . nystatin   Topical BID  . pantoprazole  40 mg Oral Daily  . potassium chloride  30 mEq Oral Once  . predniSONE  5 mg Oral Q breakfast   Elmarie Shiley, MD 08/08/2018, 10:04 AM

## 2018-08-08 NOTE — Progress Notes (Signed)
[facility] PHYSICAL MEDICINE & REHABILITATION PROGRESS NOTE  Subjective/Complaints: Patient seen sitting up in her chair this morning.  She states she slept well overnight.  Yesterday, patient noted to have severe orthostasis.  Discussed with nephro, bolused.  Please see PA note.  She was also noted to be in severe pain yesterday.  ROS: Denies CP, shortness of breath, nausea, vomiting, diarrhea.  Objective: Vital Signs: Blood pressure (!) 107/40, pulse 66, temperature 97.7 F (36.5 C), temperature source Oral, resp. rate 14, height 5\' 1"  (1.549 m), weight 63.6 kg, SpO2 90 %. Dg Lumbar Spine 2-3 Views  Result Date: 08/07/2018 CLINICAL DATA:  Pain EXAM: LUMBAR SPINE - 2-3 VIEW COMPARISON:  CT abdomen and pelvis 07/24/2018 FINDINGS: Osseous demineralization. Five non-rib-bearing lumbar vertebra. Multilevel disc space narrowing and endplate spur formation, greatest at L3-L4 and T11-T12. Facet degenerative changes of the lower lumbar spine. Grade 1 anterolisthesis at L4-L5 likely due to degenerative disc and facet disease. Vertebral body heights maintained without fracture, additional subluxation, or bone destruction. No spondylolysis. Atherosclerotic calcifications aorta. SI joints preserved. IMPRESSION: Degenerative disc and facet disease changes of the lumbar spine as above. Electronically Signed   By: Lavonia Dana M.D.   On: 08/07/2018 14:56   Recent Labs    08/07/18 0328  WBC 11.4*  HGB 10.8*  HCT 34.5*  PLT 194   Recent Labs    08/07/18 0328 08/08/18 0140  NA 142 143  K 3.5 3.4*  CL 88* 93*  CO2 40* 38*  GLUCOSE 229* 189*  BUN 68* 61*  CREATININE 1.88* 1.72*  CALCIUM 8.0* 7.9*    Physical Exam: BP (!) 107/40 (BP Location: Left Arm)   Pulse 66   Temp 97.7 F (36.5 C) (Oral)   Resp 14   Ht 5\' 1"  (1.549 m)   Wt 63.6 kg   SpO2 90%   BMI 26.49 kg/m  Constitutional: No distress . Vital signs reviewed. HENT: Normocephalic.  Atraumatic. Eyes: EOMI.  No  discharge. Cardiovascular: No JVD. Respiratory: Normal effort.  + [place]. GI: Non-distended. Musc: Joint deformities due to RA Neurological: She is alert.  Alert Follows commands.   HOH Motor: Right upper extremity: 5/5 proximal distal, unchanged Right lower extremity: 4-4+/5 proximal distal, unchanged Left upper extremity: 4-4+/5 proximal distal Left lower extremity: 3+/5 proximal to distal, unchanged  Skin: Skin is warm and dry.  Psychiatric: Her affect is blunt.    Assessment/Plan: 1. Functional deficits secondary to debility which require 3+ hours per day of interdisciplinary therapy in a comprehensive inpatient rehab setting.  Physiatrist is providing close team supervision and 24 hour management of active medical problems listed below.  Physiatrist and rehab team continue to assess barriers to discharge/monitor patient progress toward functional and medical goals  Care Tool:  Bathing    Body parts bathed by patient: Right arm, Left arm, Chest, Abdomen, Right upper leg, Left upper leg, Face   Body parts bathed by helper: Front perineal area, Buttocks, Right lower leg, Left lower leg     Bathing assist Assist Level: Minimal Assistance - Patient > 75%     Upper Body Dressing/Undressing Upper body dressing   What is the patient wearing?: Pull over shirt    Upper body assist Assist Level: Minimal Assistance - Patient > 75%    Lower Body Dressing/Undressing Lower body dressing      What is the patient wearing?: Underwear/pull up, Pants     Lower body assist Assist for lower body dressing: Minimal Assistance - Patient >  75%     Toileting Toileting    Toileting assist Assist for toileting: Minimal Assistance - Patient > 75%     Transfers Chair/bed transfer  Transfers assist     Chair/bed transfer assist level: Minimal Assistance - Patient > 75%     Locomotion Ambulation   Ambulation assist              Walk 10 feet activity   Assist            Walk 50 feet activity   Assist           Walk 150 feet activity   Assist           Walk 10 feet on uneven surface  activity   Assist           Wheelchair     Assist               Wheelchair 50 feet with 2 turns activity    Assist            Wheelchair 150 feet activity     Assist            Medical Problem List and Plan: 1.  Debility secondary to septic shock due to C. difficile colitis.  Complete course of vancomycin x4 more days.  Contact precautions  Continue CIR 2.  Antithrombotics: -DVT/anticoagulation: Eliquis             -antiplatelet therapy: N/A 3. Pain Management: Oxycodone as needed 4. Mood: Provide emotional support             -antipsychotic agents: N/A 5. Neuropsych: This patient is ?  Fully capable of making decisions on her own behalf. 6. Skin/Wound Care: Routine skin checks 7. Fluids/Electrolytes/Nutrition: Routine in and outs 8.  New onset PAF.  Lopressor 12.5 mg twice daily.  Continue Eliquis.  Cardiac rate controlled             Rate controlled on 6/5  Monitor with increased mobility 9.    Steroid-induced hyperglycemia on diabetes mellitus.  Hemoglobin A1c 8.8.  Sliding scale insulin.  Remains elevated, however steroids recently decreased.  Continue to monitor.  Will make adjustments to diabetes meds.  Monitor with increased mobility 10.  Rheumatoid arthritis.  Patient on Enbrel 50 mg injection weekly and can be resumed as outpatient.  Continue chronic prednisone 5 mg daily 11.  AKI.  Follow-up renal services  Appreciate nephro recs, adjusting diuretics             Creatinine 1.70 on 6/5 500 cc fluid bolus on 6/4, labs ordered for Monday  Encourage fluids  Continue to monitor 12.  Hypoalbuminemia  Supplement initiated on 6/4 13.  Leukocytosis-likely steroid-induced +/- C. difficile  WBCs 11.4 on 6/4, labs ordered for Monday  Afebrile  Continue to monitor 14.?  Acute blood loss  anemia  Hemoglobin 12.8 on 6/4  Continue to monitor  15.  Lumbar back pain  Secondary degenerative disc disease as well as facet arthropathy of lumbar spine  L-spine x-ray reviewed suggesting degenerative changes 16.  Hypokalemia  Potassium 3.4 on 6/5  Likely secondary to diuresis, supplemented x1 dose  LOS: 2 days A FACE TO FACE EVALUATION WAS PERFORMED  Marcea Rojek Lorie Phenix 08/08/2018, 9:24 AM

## 2018-08-08 NOTE — Progress Notes (Signed)
Occupational Therapy Session Note  Patient Details  Name: Shelby Walker MRN: [id number] Date of Birth: [birth year]  Today's Date: 08/08/2018 OT Individual Time: 0705-0730 OT Individual Time Calculation (min): 25 min    Short Term Goals: Week 1:  OT Short Term Goal 1 (Week 1): Patient will completed UB and LB bathing and dressing at Supervision level using LRAD. OT Short Term Goal 2 (Week 1): Patient will complete toilet transfers at Supervision with DME if needed.  OT Short Term Goal 3 (Week 1): Patient will increase sitting tolerance by completing a task for 15 minutes or more in order to participate in ADL with less rest breaks. OT Short Term Goal 4 (Week 1): Patient will complete simulated walk-in shower transfer at Supervision level with Shower seat if needed. OT Short Term Goal 5 (Week 1): Patient will increase BUE strength and endurance to 4-/5 to increase participation in ADL tasks and functional transfers.   Skilled Therapeutic Interventions/Progress Updates:     1;1. Pt received in bed reporting no pain but needing to toilet. No report of dizziness during session with mobility. Pt completes stand pivot transfer with use of bed rail/grab bar with MIN A. Pt completes toileting with MIN A for steadying for CM/hygiene. Pt able to have BM/bladder on toilet. Pt completes washing hands, brushing hair and teeth seated at sink for energy conservation. Pt declines changing clothing. Pt requries VC for reaching back for seat prior to sitting and slow eccentric sit to seat. Exited session with pt seated in recliner, call light in reach, belt alarm on and all needs met   Therapy Documentation Precautions:  Precautions Precautions: Fall Precaution Comments: left knee buckles Restrictions Weight Bearing Restrictions: No General:   Vital Signs: Therapy Vitals Temp: 97.7 F (36.5 C) Temp Source: Oral Pulse Rate: 66 Resp: 14 BP: (!) 107/40 Patient Position (if appropriate): Lying Oxygen  Therapy SpO2: 90 % O2 Device: Room Air Pain:   ADL: ADL Eating: Modified independent Where Assessed-Eating: Edge of bed Grooming: Setup Where Assessed-Grooming: Sitting at sink Upper Body Bathing: Setup Where Assessed-Upper Body Bathing: Sitting at sink Lower Body Bathing: Minimal assistance Where Assessed-Lower Body Bathing: Sitting at sink Upper Body Dressing: Minimal assistance Where Assessed-Upper Body Dressing: Sitting at sink Lower Body Dressing: Minimal assistance Where Assessed-Lower Body Dressing: Sitting at sink Toileting: Moderate assistance Where Assessed-Toileting: Glass blower/designer: Moderate assistance Toilet Transfer Method: Stand pivot Vision   Perception    Praxis   Exercises:   Other Treatments:     Therapy/Group: Individual Therapy  Tonny Branch 08/08/2018, 7:30 AM

## 2018-08-09 ENCOUNTER — Inpatient Hospital Stay (HOSPITAL_COMMUNITY): Payer: Medicare Other

## 2018-08-09 LAB — RENAL FUNCTION PANEL
Albumin: 2.1 g/dL — ABNORMAL LOW (ref 3.5–5.0)
Anion gap: 12 (ref 5–15)
BUN: 60 mg/dL — ABNORMAL HIGH (ref 8–23)
CO2: 34 mmol/L — ABNORMAL HIGH (ref 22–32)
Calcium: 7.9 mg/dL — ABNORMAL LOW (ref 8.9–10.3)
Chloride: 97 mmol/L — ABNORMAL LOW (ref 98–111)
Creatinine, Ser: 1.73 mg/dL — ABNORMAL HIGH (ref 0.44–1.00)
GFR calc Af Amer: 31 mL/min — ABNORMAL LOW (ref >60)
GFR calc non Af Amer: 27 mL/min — ABNORMAL LOW (ref >60)
Glucose, Bld: 213 mg/dL — ABNORMAL HIGH (ref 70–99)
Phosphorus: 2.5 mg/dL (ref 2.5–4.6)
Potassium: 3.9 mmol/L (ref 3.5–5.1)
Sodium: 143 mmol/L (ref 135–145)

## 2018-08-09 LAB — GLUCOSE, CAPILLARY
Glucose-Capillary: 208 mg/dL — ABNORMAL HIGH (ref 70–99)
Glucose-Capillary: 341 mg/dL — ABNORMAL HIGH (ref 70–99)
Glucose-Capillary: 265 mg/dL — ABNORMAL HIGH (ref 70–99)
Glucose-Capillary: 186 mg/dL — ABNORMAL HIGH (ref 70–99)

## 2018-08-09 LAB — MAGNESIUM: Magnesium: 2.1 mg/dL (ref 1.7–2.4)

## 2018-08-09 MED ORDER — FUROSEMIDE 20 MG PO TABS
20.0000 mg | ORAL_TABLET | Freq: Every day | ORAL | Status: AC
  Administered 2018-08-10 – 2018-08-11 (×2): 20 mg via ORAL
  Filled 2018-08-09 (×2): qty 1

## 2018-08-09 NOTE — Progress Notes (Signed)
Physical Therapy Session Note  Patient Details  Name: Shelby Walker MRN: [id number] Date of Birth: 1933/08/14  Today's Date: 08/09/2018 PT Individual Time:  -      Short Term Goals: Week 1:  PT Short Term Goal 1 (Week 1): Patient to ambulate 14' with RW and mod A PT Short Term Goal 2 (Week 1): Patient to tolerate standing 3 minutes for functional task PT Short Term Goal 3 (Week 1): Patient to initiate stair training. PT Short Term Goal 4 (Week 1): Patient to propel w/c 53' with S.  Skilled Therapeutic Interventions/Progress Updates:     Patient in bed upon PT arrival. Patient alert and agreeable to PT session. Patient complained of 8/10 back pain at beginning of session despite K pad placed under her back, RN made aware. BP 99/40 HR 69 and O2 95% on 1 L O2 at beginning of session. Patient remained on 1 L O2 throughout session.  Therapeutic Activity: PT donned B thigh high TEDs and B 4" and 6" ACE wraps with patient in supine. BP 104/35 HR 68. Bed Mobility: Patient performed supine to sit with CGA for safety with HOB elevated to 45 degrees and use of bed rail. Provided verbal cues for pushing up with UE. BP 131/55 HR 71 sitting EOB. Patient sat EOB x4 min with supervision without UE or LE support. Reported mild dizziness when sitting up that resolved in <1 min sitting EOB. Transfers: Patient performed a toilet transfer to the [facility] and a stand pivot transfer to the recliner with min A-CGA using a  RW. Provided verbal cues for pushing up from the bed and BSC to stand and reaching back before sitting. Patient required total A for peri care and mod A for LB dressing during toileting. PT noted some redness on the patient's sacral area, RN made aware.  BP 102/60 HR 78 sitting on BSC after transfer. BP 105/55 HR 71 sitting in the recliner after transfer.   Patient in recliner with a pillow behind her back for comfort and B LEs elevated and with a pillow to float heels at end of session with breaks  locked, seat belt alarm set, and all needs within reach. Patient had not eaten breakfast yet, PT educated on the importance of nutrition intake and patient was agreeable to trying to eat her breakfast and/or a Boost shake.  Therapy Documentation Precautions:  Precautions Precautions: Fall Precaution Comments: left knee buckles Restrictions Weight Bearing Restrictions: No Vital Signs: Therapy Vitals Pulse Rate: 69 BP: (!) 99/40 Patient Position (if appropriate): Lying Oxygen Therapy SpO2: 95 % O2 Device: Nasal Cannula O2 Flow Rate (L/min): 1 L/min Pain: Pain Assessment Pain Scale: 0-10 Pain Score: 8  Pain Type: Chronic pain Pain Location: Back Pain Descriptors / Indicators: Aching Pain Frequency: Intermittent Pain Onset: On-going Pain Intervention(s): RN made aware;Repositioned;Distraction    Therapy/Group: Individual Therapy  Maynor Mwangi L Jonn Chaikin PT, DPT  08/09/2018, 9:17 AM

## 2018-08-09 NOTE — Progress Notes (Signed)
Shelby Walker is a 85 y.o. female November 27, 1933 [id number]  Subjective: Reports ongoing fatigue with minimal exertion.  Denies specific pain or problems  Objective: Vital signs in last 24 hours: Temp:  [98 F (36.7 C)-98.6 F (37 C)] 98.6 F (37 C) (06/06 1333) Pulse Rate:  [67-72] 70 (06/06 1333) Resp:  [15-18] 18 (06/06 1333) BP: (75-146)/(35-118) 110/35 (06/06 1333) SpO2:  [96 %-100 %] 100 % (06/06 1333) Weight:  [59.2 kg] 59.2 kg (06/06 0500) Weight change: -4.4 kg Last BM Date: 08/09/18  Intake/Output from previous day: 06/05 0701 - 06/06 0700 In: 660 [P.O.:660] Out: -   Physical Exam General: No apparent distress    Lungs: Normal effort. Lungs clear to auscultation, no crackles or wheezes. Cardiovascular: Regular rate and rhythm, no edema   Lab Results: BMET    Component Value Date/Time   NA 143 08/09/2018 0251   K 3.9 08/09/2018 0251   CL 97 (L) 08/09/2018 0251   CO2 34 (H) 08/09/2018 0251   GLUCOSE 213 (H) 08/09/2018 0251   BUN 60 (H) 08/09/2018 0251   CREATININE 1.73 (H) 08/09/2018 0251   CALCIUM 7.9 (L) 08/09/2018 0251   GFRNONAA 27 (L) 08/09/2018 0251   GFRAA 31 (L) 08/09/2018 0251   CBC    Component Value Date/Time   WBC 11.4 (H) 08/07/2018 0328   RBC 3.73 (L) 08/07/2018 0328   HGB 10.8 (L) 08/07/2018 0328   HCT 34.5 (L) 08/07/2018 0328   PLT 194 08/07/2018 0328   MCV 92.5 08/07/2018 0328   MCH 29.0 08/07/2018 0328   MCHC 31.3 08/07/2018 0328   RDW 15.7 (H) 08/07/2018 0328   LYMPHSABS 1.5 08/07/2018 0328   MONOABS 0.7 08/07/2018 0328   EOSABS 0.0 08/07/2018 0328   BASOSABS 0.0 08/07/2018 0328   CBG's (last 3):   Recent Labs    08/08/18 2136 08/09/18 0634 08/09/18 1144  GLUCAP 275* 208* 341*   LFT's Lab Results  Component Value Date   ALT 18 08/07/2018   AST 25 08/07/2018   ALKPHOS 47 08/07/2018   BILITOT 0.8 08/07/2018    Studies/Results: Dg Lumbar Spine 2-3 Views  Result Date: 08/07/2018 CLINICAL DATA:  Pain EXAM: LUMBAR SPINE  - 2-3 VIEW COMPARISON:  CT abdomen and pelvis 07/24/2018 FINDINGS: Osseous demineralization. Five non-rib-bearing lumbar vertebra. Multilevel disc space narrowing and endplate spur formation, greatest at L3-L4 and T11-T12. Facet degenerative changes of the lower lumbar spine. Grade 1 anterolisthesis at L4-L5 likely due to degenerative disc and facet disease. Vertebral body heights maintained without fracture, additional subluxation, or bone destruction. No spondylolysis. Atherosclerotic calcifications aorta. SI joints preserved. IMPRESSION: Degenerative disc and facet disease changes of the lumbar spine as above. Electronically Signed   By: Lavonia Dana M.D.   On: 08/07/2018 14:56    Medications:  I have reviewed the patient's current medications. Scheduled Medications: . apixaban  2.5 mg Oral BID  . atorvastatin  20 mg Oral q1800  . feeding supplement  1 Container Oral BID BM  . feeding supplement (ENSURE ENLIVE)  237 mL Oral BID BM  . feeding supplement (PRO-STAT SUGAR FREE 64)  30 mL Oral BID  . [START ON 08/10/2018] furosemide  20 mg Oral Daily  . Gerhardt's butt cream   Topical BID  . insulin aspart  0-9 Units Subcutaneous TID WC  . metoprolol tartrate  12.5 mg Oral BID  . multivitamin with minerals  1 tablet Oral Daily  . nystatin   Topical BID  . pantoprazole  40  mg Oral Daily  . potassium chloride  20 mEq Oral Daily  . predniSONE  5 mg Oral Q breakfast   PRN Medications: acetaminophen **OR** acetaminophen, albuterol, nitroGLYCERIN, ondansetron **OR** ondansetron (ZOFRAN) IV, oxyCODONE, polyvinyl alcohol, sorbitol  Assessment/Plan: Active Problems:   Debility   AKI (acute kidney injury) ([place])   Diabetes mellitus type 2 in nonobese [facility])   New onset atrial fibrillation ([facility])   Acute blood loss anemia   Leukocytosis   Hypoalbuminemia due to protein-calorie malnutrition ([facility])   Pain   Hypokalemia   Steroid-induced hyperglycemia   1.  Debilitation following prolonged medical  illness with subsequent functional deficits requiring interdisciplinary therapy with comprehensive inpatient rehab setting.  Continue medical and ego support as ongoing. 2.  Recent C. difficile colitis with septic shock.  Continue oral vancomycin to complete course as ongoing. 3.  New onset paroxysmal A. fib during hospitalization.  Currently rate controlled on low-dose beta-blocker. 4.  Steroid-induced hyperglycemia with baseline diabetes.  Continue sliding scale insulin and low-carb diet with ongoing treatment. 5.  Rheumatoid arthritis on chronic prednisone in addition to outpatient weekly immunotherapy injection Enbrel.  To be resumed as outpatient.  No acute flares noted.  Length of stay, days: 3  Spencer Cardinal A. Asa Lente, MD 08/09/2018, 1:48 PM

## 2018-08-09 NOTE — Plan of Care (Signed)
  Problem: Consults Goal: RH GENERAL PATIENT EDUCATION Description See Patient Education module for education specifics. Outcome: Progressing   Problem: RH SKIN INTEGRITY Goal: RH STG MAINTAIN SKIN INTEGRITY WITH ASSISTANCE Description STG Maintain Skin Integrity With min Assistance.  Outcome: Progressing   Problem: RH BLADDER ELIMINATION Goal: RH STG MANAGE BLADDER WITH ASSISTANCE Description STG Manage Bladder With Min Assistance  Outcome: Progressing   Problem: RH SAFETY Goal: RH STG ADHERE TO SAFETY PRECAUTIONS W/ASSISTANCE/DEVICE Description STG Adhere to Safety Precautions With supervision Assistance/Device.  Outcome: Progressing   Problem: RH PAIN MANAGEMENT Goal: RH STG PAIN MANAGED AT OR BELOW PT'S PAIN GOAL Description <3 out of 10.   Outcome: Progressing   Problem: RH KNOWLEDGE DEFICIT GENERAL Goal: RH STG INCREASE KNOWLEDGE OF SELF CARE AFTER HOSPITALIZATION Outcome: Progressing

## 2018-08-09 NOTE — Progress Notes (Signed)
Occupational Therapy Session Note  Patient Details  Name: Shelby Walker MRN: [id number] Date of Birth: 06-24-1933  Today's Date: 08/09/2018 OT Individual Time: 1400-1440 OT Individual Time Calculation (min): 40 min    Short Term Goals: Week 1:  OT Short Term Goal 1 (Week 1): Patient will completed UB and LB bathing and dressing at Supervision level using LRAD. OT Short Term Goal 2 (Week 1): Patient will complete toilet transfers at Supervision with DME if needed.  OT Short Term Goal 3 (Week 1): Patient will increase sitting tolerance by completing a task for 15 minutes or more in order to participate in ADL with less rest breaks. OT Short Term Goal 4 (Week 1): Patient will complete simulated walk-in shower transfer at Supervision level with Shower seat if needed. OT Short Term Goal 5 (Week 1): Patient will increase BUE strength and endurance to 4-/5 to increase participation in ADL tasks and functional transfers.   Skilled Therapeutic Interventions/Progress Updates:     1:1. Pt asleep upon arrival but willing to try tx despite fatigue. See below for vitals. Pt supine.sitting EOB with CGA with VC for use of bed rails. Pt initally reports dizziness but then it subsides. Pt completes AROM exercises 2x10 LB LAQ, heel pumps, marches, hip ab/adduction, shoulder flex/ext, ab/adduct, chest press and overhead press to elevate BP, arousal and activity tolerance. Pt reports increased dizziness and OT provides mod A for sit>supine. OT reviews energy conservation priniciples and provides pt with specific examples for ADL/IADL items (pt given handout to review at later date). Pts eyes remain closed during discussion however pt nodding and responding to education. Pt reporting max fatigue and requests to end session early. Pt missed 20 min skilled OT d/t fatigue and low BP.  Vitals assessed as follows with teds and ace wraps applied to BLE up to thighs Supine: 70/39 EOB 81/58 EOB after AROM exercises:  88/33 Supine: 78/37    Therapy Documentation Precautions:  Precautions Precautions: Fall Precaution Comments: left knee buckles Restrictions Weight Bearing Restrictions: No General:   Vital Signs: Therapy Vitals Temp: 98.6 F (37 C) Pulse Rate: 70 Resp: 18 BP: (!) 110/35 Patient Position (if appropriate): Sitting Oxygen Therapy SpO2: 100 % O2 Device: Nasal Cannula Pain: Pain Assessment Pain Scale: 0-10 Pain Score: 8  Faces Pain Scale: Hurts even more Pain Type: Acute pain Pain Location: Back Pain Orientation: Lower;Medial Pain Descriptors / Indicators: Aching;Discomfort Pain Frequency: Constant Pain Onset: On-going Pain Intervention(s): Medication (See eMAR);Repositioned;Heat applied;Emotional support ADL: ADL Eating: Modified independent Where Assessed-Eating: Edge of bed Grooming: Setup Where Assessed-Grooming: Sitting at sink Upper Body Bathing: Setup Where Assessed-Upper Body Bathing: Sitting at sink Lower Body Bathing: Minimal assistance Where Assessed-Lower Body Bathing: Sitting at sink Upper Body Dressing: Minimal assistance Where Assessed-Upper Body Dressing: Sitting at sink Lower Body Dressing: Minimal assistance Where Assessed-Lower Body Dressing: Sitting at sink Toileting: Moderate assistance Where Assessed-Toileting: Glass blower/designer: Moderate assistance Toilet Transfer Method: Stand pivot Vision   Perception    Praxis   Exercises:   Other Treatments:     Therapy/Group: Individual Therapy  Tonny Branch 08/09/2018, 2:05 PM

## 2018-08-09 NOTE — Progress Notes (Signed)
Occupational Therapy Session Note  Patient Details  Name: Shelby Walker MRN: [id number] Date of Birth: 11-20-1933  Today's Date: 08/09/2018 OT Individual Time: 1000-1057 OT Individual Time Calculation (min): 57 min    Short Term Goals: Week 1:  OT Short Term Goal 1 (Week 1): Patient will completed UB and LB bathing and dressing at Supervision level using LRAD. OT Short Term Goal 2 (Week 1): Patient will complete toilet transfers at Supervision with DME if needed.  OT Short Term Goal 3 (Week 1): Patient will increase sitting tolerance by completing a task for 15 minutes or more in order to participate in ADL with less rest breaks. OT Short Term Goal 4 (Week 1): Patient will complete simulated walk-in shower transfer at Supervision level with Shower seat if needed. OT Short Term Goal 5 (Week 1): Patient will increase BUE strength and endurance to 4-/5 to increase participation in ADL tasks and functional transfers.   Skilled Therapeutic Interventions/Progress Updates:    1:1. Pt received seated in recliner after direct handoff from PT. PT sits in recliner consuming pills and OT educates on importance of eating breakfast for energy and drinking fluids to keep BP up. Pt and OT discuss responsibility at home as well as bathroom set up. Pt agreeable to geteting shower chair as she also thinks it will save her husband energy. Pt requires MOD A for LB dresssing donning pants, set up for UB bathing and dressing and A to wash buttocks d/t 2 loose stools. Pt reporting fatigue near end of session. BP sitting in recliner with teds and ace wraps on LEs 99/42. LEs elevated and head reclined slightly 97/42. RN aware of BP. Pt agreeable to staying in chair with belt alarm on and call light in reach  Therapy Documentation Precautions:  Precautions Precautions: Fall Precaution Comments: left knee buckles Restrictions Weight Bearing Restrictions: No General:   Vital Signs: Therapy Vitals Pulse Rate: 68 BP:  (!) 105/55 Patient Position (if appropriate): Sitting Oxygen Therapy SpO2: 99 % O2 Device: Nasal Cannula Patient Activity (if Appropriate): In chair Pulse Oximetry Type: Intermittent Pain: Pain Assessment Pain Scale: 0-10 Pain Score: 8  Pain Type: Chronic pain Pain Location: Back Pain Descriptors / Indicators: Aching Pain Frequency: Intermittent Pain Onset: On-going Pain Intervention(s): RN made aware;Repositioned;Distraction ADL: ADL Eating: Modified independent Where Assessed-Eating: Edge of bed Grooming: Setup Where Assessed-Grooming: Sitting at sink Upper Body Bathing: Setup Where Assessed-Upper Body Bathing: Sitting at sink Lower Body Bathing: Minimal assistance Where Assessed-Lower Body Bathing: Sitting at sink Upper Body Dressing: Minimal assistance Where Assessed-Upper Body Dressing: Sitting at sink Lower Body Dressing: Minimal assistance Where Assessed-Lower Body Dressing: Sitting at sink Toileting: Moderate assistance Where Assessed-Toileting: Glass blower/designer: Moderate assistance Toilet Transfer Method: Stand pivot Vision   Perception    Praxis   Exercises:   Other Treatments:     Therapy/Group: Individual Therapy  Tonny Branch 08/09/2018, 10:10 AM

## 2018-08-09 NOTE — Progress Notes (Signed)
Patient ID: Shelby Walker, female   DOB: 1933/04/02, 85 y.o.   MRN: [id number] [facility] Progress Note   Assessment/ Plan:   1. Acute kidney Injury on chronic kidney disease stage III (baseline creatinine 1.2-1.5): Renal function/creatinine appears relatively stable overnight with no charted urine output.  Surprisingly, weight down by 10 pounds overnight (questionable accuracy).  Will leave her on furosemide 40 mg daily at this time with a goal of titrating this down as her volume status continues to improve.  At this time, she still has exertional dyspnea and evidence of pedal edema. 2.  Volume overload: Responding well to diuresis-based on labs/symptomatic assessment, continue furosemide 40 mg daily along with compression stockings.  If she remains orthostatic, may need to reassess beta-blocker therapy. 3.  C. difficile infection/colitis: Continues to improve well on oral vancomycin 4.  Hypokalemia/metabolic alkalosis: Secondary to GI losses and now diuresis/contraction alkalosis.  Replaced via oral route.  Subjective:   Reports some shortness of breath with exertion.   Objective:   BP (!) 105/55 (BP Location: Left Arm)   Pulse 68   Temp 98 F (36.7 C) (Oral)   Resp 15   Ht 5\' 1"  (1.549 m)   Wt 59.2 kg   SpO2 99%   BMI 24.66 kg/m   Intake/Output Summary (Last 24 hours) at 08/09/2018 1030 Last data filed at 08/09/2018 0900 Gross per 24 hour  Intake 660 ml  Output -  Net 660 ml   Weight change: -4.4 kg  Physical Exam: Gen: Comfortably sleeping in bed CVS: Pulse regular rhythm, normal rate, S1 and S2 normal Resp: Diminished breath sounds over bases-poor inspiratory effort, no distinct rales Abd: Soft, obese, nontender Ext: 1+ bilateral lower extremity edema  Imaging: Dg Lumbar Spine 2-3 Views  Result Date: 08/07/2018 CLINICAL DATA:  Pain EXAM: LUMBAR SPINE - 2-3 VIEW COMPARISON:  CT abdomen and pelvis 07/24/2018 FINDINGS: Osseous demineralization. Five  non-rib-bearing lumbar vertebra. Multilevel disc space narrowing and endplate spur formation, greatest at L3-L4 and T11-T12. Facet degenerative changes of the lower lumbar spine. Grade 1 anterolisthesis at L4-L5 likely due to degenerative disc and facet disease. Vertebral body heights maintained without fracture, additional subluxation, or bone destruction. No spondylolysis. Atherosclerotic calcifications aorta. SI joints preserved. IMPRESSION: Degenerative disc and facet disease changes of the lumbar spine as above. Electronically Signed   By: Lavonia Dana M.D.   On: 08/07/2018 14:56    Labs: BMET Recent Labs  Lab 08/03/18 0417 08/04/18 0442 08/05/18 0557 08/06/18 0551 08/07/18 0328 08/08/18 0140 08/09/18 0251  NA 141 139 142 141 142 143 143  K 3.6 3.4* 3.6 3.6 3.5 3.4* 3.9  CL 95* 89* 87* 88* 88* 93* 97*  CO2 31 36* 40* 40* 40* 38* 34*  GLUCOSE 172* 209* 203* 213* 229* 189* 213*  BUN 70* 73* 74* 74* 68* 61* 60*  CREATININE 2.58* 2.52* 2.12* 1.91* 1.88* 1.72* 1.73*  CALCIUM 7.3* 7.4* 7.7* 7.7* 8.0* 7.9* 7.9*  PHOS  --   --  4.8* 3.8  --  3.5 2.5   CBC Recent Labs  Lab 08/03/18 0417 08/07/18 0328  WBC 12.0* 11.4*  NEUTROABS 9.6* 9.0*  HGB 11.3* 10.8*  HCT 35.8* 34.5*  MCV 92.5 92.5  PLT 184 194    Medications:    . apixaban  2.5 mg Oral BID  . atorvastatin  20 mg Oral q1800  . feeding supplement  1 Container Oral BID BM  . feeding supplement (ENSURE ENLIVE)  237 mL Oral  BID BM  . feeding supplement (PRO-STAT SUGAR FREE 64)  30 mL Oral BID  . furosemide  40 mg Oral Daily  . Gerhardt's butt cream   Topical BID  . insulin aspart  0-9 Units Subcutaneous TID WC  . metoprolol tartrate  12.5 mg Oral BID  . multivitamin with minerals  1 tablet Oral Daily  . nystatin   Topical BID  . pantoprazole  40 mg Oral Daily  . potassium chloride  20 mEq Oral Daily  . predniSONE  5 mg Oral Q breakfast   Elmarie Shiley, MD 08/09/2018, 10:30 AM

## 2018-08-10 LAB — RENAL FUNCTION PANEL
Albumin: 2.1 g/dL — ABNORMAL LOW (ref 3.5–5.0)
Anion gap: 11 (ref 5–15)
BUN: 55 mg/dL — ABNORMAL HIGH (ref 8–23)
CO2: 35 mmol/L — ABNORMAL HIGH (ref 22–32)
Calcium: 7.9 mg/dL — ABNORMAL LOW (ref 8.9–10.3)
Chloride: 95 mmol/L — ABNORMAL LOW (ref 98–111)
Creatinine, Ser: 1.62 mg/dL — ABNORMAL HIGH (ref 0.44–1.00)
GFR calc Af Amer: 34 mL/min — ABNORMAL LOW (ref >60)
GFR calc non Af Amer: 29 mL/min — ABNORMAL LOW (ref >60)
Glucose, Bld: 206 mg/dL — ABNORMAL HIGH (ref 70–99)
Phosphorus: 2.4 mg/dL — ABNORMAL LOW (ref 2.5–4.6)
Potassium: 3.6 mmol/L (ref 3.5–5.1)
Sodium: 141 mmol/L (ref 135–145)

## 2018-08-10 LAB — GLUCOSE, CAPILLARY
Glucose-Capillary: 178 mg/dL — ABNORMAL HIGH (ref 70–99)
Glucose-Capillary: 229 mg/dL — ABNORMAL HIGH (ref 70–99)
Glucose-Capillary: 397 mg/dL — ABNORMAL HIGH (ref 70–99)
Glucose-Capillary: 297 mg/dL — ABNORMAL HIGH (ref 70–99)

## 2018-08-10 LAB — MAGNESIUM: Magnesium: 2.1 mg/dL (ref 1.7–2.4)

## 2018-08-10 MED ORDER — POTASSIUM CHLORIDE CRYS ER 20 MEQ PO TBCR
20.0000 meq | EXTENDED_RELEASE_TABLET | Freq: Two times a day (BID) | ORAL | Status: AC
  Administered 2018-08-10 – 2018-08-11 (×2): 20 meq via ORAL
  Filled 2018-08-10 (×2): qty 1

## 2018-08-10 NOTE — Progress Notes (Signed)
[facility] Individual Statement of Services  Patient Name:  Shelby Walker  Date:  08/10/2018  Welcome to the [facility].  Our goal is to provide you with an individualized program based on your diagnosis and situation, designed to meet your specific needs.  With this comprehensive rehabilitation program, you will be expected to participate in at least 3 hours of rehabilitation therapies Monday-Friday, with modified therapy programming on the weekends.  Your rehabilitation program will include the following services:  Physical Therapy (PT), Occupational Therapy (OT), 24 hour per day rehabilitation nursing, Case Management (Social Worker), Rehabilitation Medicine, Nutrition Services and Pharmacy Services  Weekly team conferences will be held on Wednesdays to discuss your progress.  Your Social Worker will talk with you frequently to get your input and to update you on team discussions.  Team conferences with you and your family in attendance may also be held.  Expected length of stay: 3 to 3 1/2 weeks  Overall anticipated outcome: Independent with assistive device, but will need supervision when ambulating, car transfers and contact guard assist when doing stairs.  Depending on your progress and recovery, your program may change. Your Social Worker will coordinate services and will keep you informed of any changes. Your Social Worker's name and contact numbers are listed  below.  The following services may also be recommended but are not provided by the [facility] will be made to provide these services after discharge if needed.  Arrangements include referral to agencies that provide these services.  Your insurance has been verified to be:  Medicare and [employer] Your primary doctor is:  Dr. Reynold Bowen  Pertinent  information will be shared with your doctor and your insurance company.  Social Worker:  Alfonse Alpers, LCSW  [phone] or ([phone]  Information discussed with and copy given to patient by: Trey Sailors, 08/10/2018, 1:34 AM

## 2018-08-10 NOTE — Progress Notes (Signed)
Patient ID: Shelby Walker, female   DOB: 09-Nov-1933, 85 y.o.   MRN: [id number] [facility] Progress Note   Assessment/ Plan:   1. Acute kidney Injury on chronic kidney disease stage III (baseline creatinine 1.2-1.5): Renal function/creatinine are slightly better overnight and urine output again is not charted.  I will order for discontinuation of furosemide after her dose tomorrow as she appears to be nearing her euvolemic state and we would like to avoid volume depletion/worsening renal function.  I will sign off at this time with recommendations for her to continue follow-up with her primary care provider as renal function appears to be back to baseline. 2.  Volume overload: She has responded well to diuretic therapy and now nearing euvolemic state, will order for discontinuation of furosemide after her dose tomorrow. 3.  C. difficile infection/colitis: Continues to improve well on oral vancomycin 4.  Hypokalemia/metabolic alkalosis: Secondary to GI losses and now diuresis/contraction alkalosis.  Anticipate to improve with renal functional recovery and discontinuation of diuretics.  Subjective:   Reports to be feeling better and denies any chest pain or shortness of breath.  Feels that she is getting stronger.    Objective:   BP (!) 107/50 (BP Location: Left Arm)   Pulse 70   Temp 97.9 F (36.6 C) (Oral)   Resp 16   Ht 5\' 1"  (1.549 m)   Wt 58.4 kg   SpO2 100%   BMI 24.33 kg/m   Intake/Output Summary (Last 24 hours) at 08/10/2018 0856 Last data filed at 08/10/2018 0030 Gross per 24 hour  Intake 342 ml  Output -  Net 342 ml   Weight change: -0.8 kg  Physical Exam: Gen: Resting comfortably in bed, awake and alert CVS: Pulse regular rhythm, normal rate, S1 and S2 normal Resp: Anteriorly clear to auscultation, no rales or rhonchi Abd: Soft, obese, nontender Ext: Trace lower extremity edema, trace to 1+ edema over sacral area  Imaging: No results  found.  Labs: BMET Recent Labs  Lab 08/04/18 0442 08/05/18 0557 08/06/18 0551 08/07/18 0328 08/08/18 0140 08/09/18 0251 08/10/18 0511  NA 139 142 141 142 143 143 141  K 3.4* 3.6 3.6 3.5 3.4* 3.9 3.6  CL 89* 87* 88* 88* 93* 97* 95*  CO2 36* 40* 40* 40* 38* 34* 35*  GLUCOSE 209* 203* 213* 229* 189* 213* 206*  BUN 73* 74* 74* 68* 61* 60* 55*  CREATININE 2.52* 2.12* 1.91* 1.88* 1.72* 1.73* 1.62*  CALCIUM 7.4* 7.7* 7.7* 8.0* 7.9* 7.9* 7.9*  PHOS  --  4.8* 3.8  --  3.5 2.5 2.4*   CBC Recent Labs  Lab 08/07/18 0328  WBC 11.4*  NEUTROABS 9.0*  HGB 10.8*  HCT 34.5*  MCV 92.5  PLT 194    Medications:    . apixaban  2.5 mg Oral BID  . atorvastatin  20 mg Oral q1800  . feeding supplement  1 Container Oral BID BM  . feeding supplement (ENSURE ENLIVE)  237 mL Oral BID BM  . feeding supplement (PRO-STAT SUGAR FREE 64)  30 mL Oral BID  . furosemide  20 mg Oral Daily  . Gerhardt's butt cream   Topical BID  . insulin aspart  0-9 Units Subcutaneous TID WC  . metoprolol tartrate  12.5 mg Oral BID  . multivitamin with minerals  1 tablet Oral Daily  . nystatin   Topical BID  . pantoprazole  40 mg Oral Daily  . potassium chloride  20 mEq Oral Daily  .  predniSONE  5 mg Oral Q breakfast   Elmarie Shiley, MD 08/10/2018, 8:56 AM

## 2018-08-10 NOTE — Progress Notes (Signed)
Shelby Walker is a 85 y.o. female March 09, 1933 [id number]  Subjective: Reports feeling ok and getting better everyday. Denies pain or shortness of breath   Objective: Vital signs in last 24 hours: Temp:  [97.7 F (36.5 C)-98.6 F (37 C)] 97.9 F (36.6 C) (06/07 0527) Pulse Rate:  [62-70] 70 (06/07 0527) Resp:  [16-18] 16 (06/07 0527) BP: (105-116)/(35-50) 107/50 (06/07 0527) SpO2:  [100 %] 100 % (06/07 0527) Weight:  [58.4 kg] 58.4 kg (06/07 0500) Weight change: -0.8 kg Last BM Date: 08/10/18  Intake/Output from previous day: 06/06 0701 - 06/07 0700 In: 342 [P.O.:342] Out: -   Physical Exam General: No apparent distress    Lungs: Normal effort. Lungs clear to auscultation, no crackles or wheezes. Cardiovascular: Regular rate and rhythm, no edema   Lab Results: BMET    Component Value Date/Time   NA 141 08/10/2018 0511   K 3.6 08/10/2018 0511   CL 95 (L) 08/10/2018 0511   CO2 35 (H) 08/10/2018 0511   GLUCOSE 206 (H) 08/10/2018 0511   BUN 55 (H) 08/10/2018 0511   CREATININE 1.62 (H) 08/10/2018 0511   CALCIUM 7.9 (L) 08/10/2018 0511   GFRNONAA 29 (L) 08/10/2018 0511   GFRAA 34 (L) 08/10/2018 0511   CBC    Component Value Date/Time   WBC 11.4 (H) 08/07/2018 0328   RBC 3.73 (L) 08/07/2018 0328   HGB 10.8 (L) 08/07/2018 0328   HCT 34.5 (L) 08/07/2018 0328   PLT 194 08/07/2018 0328   MCV 92.5 08/07/2018 0328   MCH 29.0 08/07/2018 0328   MCHC 31.3 08/07/2018 0328   RDW 15.7 (H) 08/07/2018 0328   LYMPHSABS 1.5 08/07/2018 0328   MONOABS 0.7 08/07/2018 0328   EOSABS 0.0 08/07/2018 0328   BASOSABS 0.0 08/07/2018 0328   CBG's (last 3):   Recent Labs    08/09/18 1713 08/09/18 2120 08/10/18 0635  GLUCAP 265* 186* 178*   LFT's Lab Results  Component Value Date   ALT 18 08/07/2018   AST 25 08/07/2018   ALKPHOS 47 08/07/2018   BILITOT 0.8 08/07/2018    Studies/Results: No results found.  Medications:  I have reviewed the patient's current  medications. Scheduled Medications: . apixaban  2.5 mg Oral BID  . atorvastatin  20 mg Oral q1800  . feeding supplement  1 Container Oral BID BM  . feeding supplement (ENSURE ENLIVE)  237 mL Oral BID BM  . feeding supplement (PRO-STAT SUGAR FREE 64)  30 mL Oral BID  . furosemide  20 mg Oral Daily  . Gerhardt's butt cream   Topical BID  . insulin aspart  0-9 Units Subcutaneous TID WC  . metoprolol tartrate  12.5 mg Oral BID  . multivitamin with minerals  1 tablet Oral Daily  . nystatin   Topical BID  . pantoprazole  40 mg Oral Daily  . potassium chloride  20 mEq Oral BID  . predniSONE  5 mg Oral Q breakfast   PRN Medications: acetaminophen **OR** acetaminophen, albuterol, nitroGLYCERIN, ondansetron **OR** ondansetron (ZOFRAN) IV, oxyCODONE, polyvinyl alcohol, sorbitol  Assessment/Plan: Active Problems:   Debility   AKI (acute kidney injury) ([place])   Diabetes mellitus type 2 in nonobese [facility])   New onset atrial fibrillation (HCC)   Acute blood loss anemia   Leukocytosis   Hypoalbuminemia due to protein-calorie malnutrition (HCC)   Pain   Hypokalemia   Steroid-induced hyperglycemia   1.  Debilitation following prolonged medical illness with subsequent functional deficits requiring interdisciplinary therapy with comprehensive  inpatient rehab setting.  Continue medical and ego support as ongoing. 2.  Recent C. difficile colitis with septic shock.  Continue oral vancomycin to complete course as ongoing. 3.  New onset paroxysmal A. fib during hospitalization.  Currently rate controlled on low-dose beta-blocker. 4.  Steroid-induced hyperglycemia with baseline diabetes.  Continue sliding scale insulin and low-carb diet with ongoing treatment. 5.  Rheumatoid arthritis on chronic prednisone in addition to outpatient weekly immunotherapy injection Enbrel.  To be resumed as outpatient.  No acute flares noted.  Length of stay, days: 4  Catharine Kettlewell A. Asa Lente, MD 08/10/2018, 9:55 AM

## 2018-08-10 NOTE — Progress Notes (Signed)
Social Work Assessment and Plan  Patient Details  Name: Shelby Walker MRN: [id number] Date of Birth: 1933/12/20  Today's Date: 08/08/2018  Problem List:  Patient Active Problem List   Diagnosis Date Noted  . Pain   . Hypokalemia   . Steroid-induced hyperglycemia   . Acute blood loss anemia   . Leukocytosis   . Hypoalbuminemia due to protein-calorie malnutrition ([place])   . Debility 08/06/2018  . AKI (acute kidney injury) ([place])   . Diabetes mellitus type 2 in nonobese (HCC)   . New onset atrial fibrillation ([place])   . Shock ([place]) 07/30/2018  . Diverticulitis 07/24/2018  . Colitis presumed infectious 07/24/2018  . Colitis 07/24/2018  . Clostridium difficile colitis 07/24/2018  . Mixed hyperlipidemia 09/29/2016  . Stable angina ([place]) 09/29/2016  . Claudication in peripheral vascular disease ([place]) 08/09/2016  . Preoperative clearance 05/21/2012  . Precordial chest pain   . Palpitations   . Diabetes mellitus ([place])   . GERD (gastroesophageal reflux disease)   . Ejection fraction   . Carotid artery disease ([place])   . Mitral regurgitation 04/21/2009  . PSORIASIS 02/09/2009  . Rheumatoid arthritis ([place]) 02/09/2009   Past Medical History:  Past Medical History:  Diagnosis Date  . Arthritis, rheumatoid ([place])   . Bruit    Carotid Doppler showed no significant abnormality     9per patient)  . Chest pain, unspecified    Nuclear, May, 2008, no scar or ischemia  . Diabetes mellitus   . Dyslipidemia   . Ejection fraction    EF 55-60%, echo, February, 2011  . GERD (gastroesophageal reflux disease)   . Mitral regurgitation April 21, 2009   mild,  echo, February, 2011  . Osteoporosis   . Palpitations    possible very brief atrial fibrillation on monitor and possible reentrant tachycardia  . Psoriasis   . Rheumatoid arthritis [facility])    Past Surgical History:  Past Surgical History:  Procedure Laterality Date  . FRACTURE SURGERY     Social History:  reports that she has never  smoked. She has never used smokeless tobacco. She reports that she does not drink alcohol or use drugs.  Family / Support Systems Marital Status: Married How Long?: 51 years? Patient Roles: Spouse Spouse/Significant Other: Marji Kuehnel - husband - [phone] Children: [name] - dtr - [phone]; Remer Macho - son - [phone]) [phone] Anticipated Caregiver: son, daugther, son-in-law (plan to have schedule for 24/7 A) Ability/Limitations of Caregiver: min A Caregiver Availability: 24/7 Family Dynamics: close, supportive family  Social History Preferred language: English Religion: Presbyterian Read: Yes Write: Yes Employment Status: Retired Public relations account executive Issues: none reported Guardian/Conservator: N/A - MD has determined that pt is capable of making her own decisions.   Abuse/Neglect Abuse/Neglect Assessment Can Be Completed: Yes Physical Abuse: Denies Verbal Abuse: Denies Sexual Abuse: Denies Exploitation of patient/patient's resources: Denies Self-Neglect: Denies  Emotional Status Pt's affect, behavior and adjustment status: Pt was fatigued during visit, but seems to be more comfortable and is coping with things as they come. Recent Psychosocial Issues: Pt was the caregiver for her husband who has some heart issues. Psychiatric History: none reproted Substance Abuse History: none reported  Patient / Family Perceptions, Expectations & Goals Pt/Family understanding of illness & functional limitations: Pt reports a good understanding of her condition and current limitations. Premorbid pt/family roles/activities: Pt takes care of her husband, her home, drives, and likes to play cornhole.  Her son is her partner for cornhole. Anticipated  changes in roles/activities/participation: Pt would like to resume the above activities as she is able. Pt/family expectations/goals: Pt wants to return straight home from CIR to be with her family.  Community  [employer] Agencies: None Premorbid Home Care/DME Agencies: Other (Comment)(has a walker and cane) Transportation available at discharge: family Resource referrals recommended: Neuropsychology  Discharge Planning Living Arrangements: Spouse/significant other Support Systems: Spouse/significant other, Children, Friends/neighbors, Other relatives Type of Residence: Private residence Insurance Resources: [employer], Multimedia programmer (specify)([employer]) Financial Resources: Radio broadcast assistant Screen Referred: No Money Management: Patient Does the patient have any problems obtaining your medications?: No Home Management: Pt was doing a lot of this.  Family can assist. Patient/Family Preliminary Plans: Pt plans to return to her home where her children will take turns beign with her as needed. Social Work Anticipated Follow Up Needs: HH/OP Expected length of stay: 3 to 3 1/2 weeks  Clinical Impression CSW met with pt to introduce self and role of CSW, as well as to complete assessment.  Pt was fatigued during visit, but was pleasant and able to talk with CSW.  Pt is pleased to be on CIR and looks forward to getting stronger so she can go home.  Pt is the caregiver for her husband.  Family is helping him.  Pt likes to play cornhole and her son is her partner.  She looks forward to getting back to that.  Pt is not feeling down or depressed currently, but CSW will continue to monitor and assist as needed.  CSW will also f/u with pt's dtr and assist as needed.  Candace Begue, Silvestre Mesi 08/10/2018, 6:16 PM

## 2018-08-11 ENCOUNTER — Inpatient Hospital Stay (HOSPITAL_COMMUNITY): Payer: Medicare Other

## 2018-08-11 ENCOUNTER — Inpatient Hospital Stay (HOSPITAL_COMMUNITY): Payer: Medicare Other | Admitting: Physical Therapy

## 2018-08-11 ENCOUNTER — Inpatient Hospital Stay (HOSPITAL_COMMUNITY): Payer: Medicare Other | Admitting: Occupational Therapy

## 2018-08-11 DIAGNOSIS — R0602 Shortness of breath: Secondary | ICD-10-CM

## 2018-08-11 DIAGNOSIS — R0609 Other forms of dyspnea: Secondary | ICD-10-CM

## 2018-08-11 DIAGNOSIS — R0789 Other chest pain: Secondary | ICD-10-CM

## 2018-08-11 LAB — CBC WITH DIFFERENTIAL/PLATELET
Abs Immature Granulocytes: 0 10*3/uL (ref 0.00–0.07)
Basophils Absolute: 0.1 10*3/uL (ref 0.0–0.1)
Basophils Relative: 1 %
Eosinophils Absolute: 0.1 10*3/uL (ref 0.0–0.5)
Eosinophils Relative: 1 %
HCT: 29.8 % — ABNORMAL LOW (ref 36.0–46.0)
Hemoglobin: 9.3 g/dL — ABNORMAL LOW (ref 12.0–15.0)
Lymphocytes Relative: 11 %
Lymphs Abs: 0.8 10*3/uL (ref 0.7–4.0)
MCH: 29.1 pg (ref 26.0–34.0)
MCHC: 31.2 g/dL (ref 30.0–36.0)
MCV: 93.1 fL (ref 80.0–100.0)
Monocytes Absolute: 0.1 10*3/uL (ref 0.1–1.0)
Monocytes Relative: 1 %
Neutro Abs: 6.6 10*3/uL (ref 1.7–7.7)
Neutrophils Relative %: 86 %
Platelets: 190 10*3/uL (ref 150–400)
RBC: 3.2 MIL/uL — ABNORMAL LOW (ref 3.87–5.11)
RDW: 16.4 % — ABNORMAL HIGH (ref 11.5–15.5)
WBC: 7.7 10*3/uL (ref 4.0–10.5)
nRBC: 0 % (ref 0.0–0.2)
nRBC: 0 /100 WBC

## 2018-08-11 LAB — RENAL FUNCTION PANEL
Albumin: 2.1 g/dL — ABNORMAL LOW (ref 3.5–5.0)
Anion gap: 13 (ref 5–15)
BUN: 53 mg/dL — ABNORMAL HIGH (ref 8–23)
CO2: 31 mmol/L (ref 22–32)
Calcium: 8.1 mg/dL — ABNORMAL LOW (ref 8.9–10.3)
Chloride: 97 mmol/L — ABNORMAL LOW (ref 98–111)
Creatinine, Ser: 1.42 mg/dL — ABNORMAL HIGH (ref 0.44–1.00)
GFR calc Af Amer: 39 mL/min — ABNORMAL LOW (ref >60)
GFR calc non Af Amer: 34 mL/min — ABNORMAL LOW (ref >60)
Glucose, Bld: 218 mg/dL — ABNORMAL HIGH (ref 70–99)
Phosphorus: 2 mg/dL — ABNORMAL LOW (ref 2.5–4.6)
Potassium: 3.9 mmol/L (ref 3.5–5.1)
Sodium: 141 mmol/L (ref 135–145)

## 2018-08-11 LAB — GLUCOSE, CAPILLARY
Glucose-Capillary: 188 mg/dL — ABNORMAL HIGH (ref 70–99)
Glucose-Capillary: 339 mg/dL — ABNORMAL HIGH (ref 70–99)
Glucose-Capillary: 239 mg/dL — ABNORMAL HIGH (ref 70–99)
Glucose-Capillary: 175 mg/dL — ABNORMAL HIGH (ref 70–99)

## 2018-08-11 LAB — MAGNESIUM: Magnesium: 2 mg/dL (ref 1.7–2.4)

## 2018-08-11 MED ORDER — K PHOS MONO-SOD PHOS DI & MONO 155-852-130 MG PO TABS
250.0000 mg | ORAL_TABLET | Freq: Once | ORAL | Status: AC
  Administered 2018-08-11: 250 mg via ORAL
  Filled 2018-08-11: qty 1

## 2018-08-11 MED ORDER — INSULIN ASPART 100 UNIT/ML ~~LOC~~ SOLN
3.0000 [IU] | Freq: Three times a day (TID) | SUBCUTANEOUS | Status: DC
  Administered 2018-08-11 – 2018-08-19 (×24): 3 [IU] via SUBCUTANEOUS

## 2018-08-11 MED ORDER — POLYVINYL ALCOHOL 1.4 % OP SOLN: 1.0000 [drp] | OPHTHALMIC | Status: DC | PRN

## 2018-08-11 MED ORDER — CARVEDILOL 3.125 MG PO TABS
3.1250 mg | ORAL_TABLET | Freq: Two times a day (BID) | ORAL | Status: DC
  Administered 2018-08-11 – 2018-08-12 (×2): 3.125 mg via ORAL
  Filled 2018-08-11 (×2): qty 1

## 2018-08-11 NOTE — Progress Notes (Addendum)
Physical Therapy Session Note  Patient Details  Name: Shelby Walker MRN: [id number] Date of Birth: 05/26/1933  Today's Date: 08/11/2018 PT Individual Time: 1545-1630 PT Individual Time Calculation (min): 45 min   Short Term Goals: Week 1:  PT Short Term Goal 1 (Week 1): Patient to ambulate 27' with RW and mod A PT Short Term Goal 2 (Week 1): Patient to tolerate standing 3 minutes for functional task PT Short Term Goal 3 (Week 1): Patient to initiate stair training. PT Short Term Goal 4 (Week 1): Patient to propel w/c 80' with S.  Skilled Therapeutic Interventions/Progress Updates:    Patient in supine and fatigued, but agreeable to PT.  Performed in bed therex as noted below.  Supine to sit with min A with rail.  Patient seated for sit<>stand x 5 with UE support and min A.  Patient ambulated to bathroom x 20' with RW and min A.  Toileted and min A for balance and donning pants.  Patient standing at sink to wash hands, and ambulated back to EOB x 15' with min a with RW.  BP after toileting 111/53, HR 79. Seated on EOB for hip flexion as noted below.  Stand step to w/c with RW and min A.  Assisted in w/c to gym and negotiated 3 4" steps with rails and min A.  Assisted to room and back to bed and left with call bell activated and needs in reach.   Therapy Documentation Precautions:  Precautions Precautions: Fall Precaution Comments: left knee buckles Restrictions Weight Bearing Restrictions: No Pain: Pain Assessment Pain Score: 7  Pain Type: Chronic pain Pain Location: Back Pain Orientation: Lower Pain Descriptors / Indicators: Guarding;Discomfort Pain Intervention(s): Repositioned;Ambulation/increased activity;Heat applied   Exercises: General Exercises - Lower Extremity Hip ABduction/ADduction: Strengthening;10 reps;Sidelying;Both(clamshell) Hip Flexion/Marching: Strengthening;Both;10 reps;Seated Heel Raises: Strengthening;Both;10 reps;Standing Repetitive Sit to Stands: Two upper  extremities(5 reps) Repetitive Sit to Stands Level of Assist: Min Repetitive Sit to Stands Surface Height: height of bed Total Joint Exercises Bridges: Strengthening;10 reps;Both;Supine    Therapy/Group: Individual Therapy  Reginia Naas  Magda Kiel, PT 08/11/2018, 5:40 PM

## 2018-08-11 NOTE — Progress Notes (Signed)
Occupational Therapy Session Note  Patient Details  Name: Shelby Walker MRN: [id number] Date of Birth: 08/31/1933  Today's Date: 08/11/2018 OT Individual Time: 0930-1015 OT Individual Time Calculation (min): 45 min    Short Term Goals: Week 1:  OT Short Term Goal 1 (Week 1): Patient will completed UB and LB bathing and dressing at Supervision level using LRAD. OT Short Term Goal 2 (Week 1): Patient will complete toilet transfers at Supervision with DME if needed.  OT Short Term Goal 3 (Week 1): Patient will increase sitting tolerance by completing a task for 15 minutes or more in order to participate in ADL with less rest breaks. OT Short Term Goal 4 (Week 1): Patient will complete simulated walk-in shower transfer at Supervision level with Shower seat if needed. OT Short Term Goal 5 (Week 1): Patient will increase BUE strength and endurance to 4-/5 to increase participation in ADL tasks and functional transfers.   Skilled Therapeutic Interventions/Progress Updates:    Patient seated in recliner, states that she has had a bowel accident and is too tired to attempt clean up in stance.  Sit to stand from recliner and SPT to bed using RW with CG/min A.  SSP to supine with CS.  Patient able to assist with pants down, dependent for hygiene due to loose BM contained in brief.  Patient completed light sponge bath of LEs in semi reclined position with set up.  Dependent to apply clean brief.  She is able to don pants and right slipper sock with Supervision/set up, min A for left sock.  She is able to sit at edge of bed with CS.  Sit to stand with min a - tolerates for approx 1 minute with weight shift activity.  Continued with unsupported sitting activities and light lower back stretches.  She returned to supine position in bed at close of session due to fatigue.  She is resting in a semi reclined position with bed alarm set and call bell in reach at close of session.    Therapy Documentation Precautions:   Precautions Precautions: Fall Precaution Comments: left knee buckles Restrictions Weight Bearing Restrictions: No General:   Vital Signs:  Pain: Pain Assessment Pain Scale: 0-10 Pain Score: 4  Pain Type: Chronic pain Pain Location: Back Pain Orientation: Lower Pain Descriptors / Indicators: Aching Pain Intervention(s): Repositioned   Other Treatments:     Therapy/Group: Individual Therapy  Carlos Levering 08/11/2018, 11:40 AM

## 2018-08-11 NOTE — Progress Notes (Signed)
[place] PHYSICAL MEDICINE & REHABILITATION PROGRESS NOTE  Subjective/Complaints: Patient seen laying in bed this morning.  She states she slept well overnight.  She states that a good weekend.  ROS: + Mild SOB/chest discomfort.  Denies CP, nausea, vomiting, diarrhea.  Objective: Vital Signs: Blood pressure (!) 127/38, pulse 72, temperature 98.4 F (36.9 C), temperature source Oral, resp. rate 14, height 5\' 1"  (1.549 m), weight 58.3 kg, SpO2 100 %. No results found. Recent Labs    08/11/18 0623  WBC PENDING  HGB 9.3*  HCT 29.8*  PLT 190   Recent Labs    08/10/18 0511 08/11/18 0615  NA 141 141  K 3.6 3.9  CL 95* 97*  CO2 35* 31  GLUCOSE 206* 218*  BUN 55* 53*  CREATININE 1.62* 1.42*  CALCIUM 7.9* 8.1*    Physical Exam: BP (!) 127/38 (BP Location: Right Arm)   Pulse 72   Temp 98.4 F (36.9 C) (Oral)   Resp 14   Ht 5\' 1"  (1.549 m)   Wt 58.3 kg   SpO2 100%   BMI 24.29 kg/m  Constitutional: No distress . Vital signs reviewed. HENT: Normocephalic.  Atraumatic. Eyes: EOMI.  No discharge. Cardiovascular: No JVD. Respiratory: Normal effort.  GI: Non-distended. Musc: Joint deformities due to RA, unchanged Neurological: She is alert.  Alert Follows commands.   HOH Motor: Right upper extremity: 5/5 proximal distal, stable Right lower extremity: 4-4+/5 proximal distal, stable Left upper extremity: 4-4+/5 proximal distal Left lower extremity: 4/5 proximal to distal  Skin: Skin is warm and dry.  Psychiatric: Her affect is blunt.    Assessment/Plan: 1. Functional deficits secondary to debility which require 3+ hours per day of interdisciplinary therapy in a comprehensive inpatient rehab setting.  Physiatrist is providing close team supervision and 24 hour management of active medical problems listed below.  Physiatrist and rehab team continue to assess barriers to discharge/monitor patient progress toward functional and medical goals  Care Tool:  Bathing    Body parts bathed by patient: Right arm, Left arm, Chest, Abdomen, Right upper leg, Left upper leg, Face   Body parts bathed by helper: Front perineal area, Buttocks, Right lower leg, Left lower leg     Bathing assist Assist Level: Minimal Assistance - Patient > 75%     Upper Body Dressing/Undressing Upper body dressing   What is the patient wearing?: Pull over shirt    Upper body assist Assist Level: Minimal Assistance - Patient > 75%    Lower Body Dressing/Undressing Lower body dressing      What is the patient wearing?: Underwear/pull up, Pants     Lower body assist Assist for lower body dressing: Moderate Assistance - Patient 50 - 74%     Toileting Toileting    Toileting assist Assist for toileting: Minimal Assistance - Patient > 75%     Transfers Chair/bed transfer  Transfers assist     Chair/bed transfer assist level: Minimal Assistance - Patient > 75%     Locomotion Ambulation   Ambulation assist              Walk 10 feet activity   Assist           Walk 50 feet activity   Assist           Walk 150 feet activity   Assist           Walk 10 feet on uneven surface  activity   Assist  Wheelchair     Assist               Wheelchair 50 feet with 2 turns activity    Assist            Wheelchair 150 feet activity     Assist            Medical Problem List and Plan: 1.  Debility secondary to septic shock due to C. difficile colitis (completed course of vancomycin).  Contact precautions  Continue CIR  Weekend notes reviewed-stable 2.  Antithrombotics: -DVT/anticoagulation: Eliquis             -antiplatelet therapy: N/A 3. Pain Management: Oxycodone as needed 4. Mood: Provide emotional support             -antipsychotic agents: N/A 5. Neuropsych: This patient is ?  Fully capable of making decisions on her own behalf. 6. Skin/Wound Care: Routine skin checks 7.  Fluids/Electrolytes/Nutrition: Routine in and outs 8.  New onset PAF.  Continue Eliquis.    Lopressor 12.5 mg twice daily, changed to Coreg on 6/8 due to hypotension             Rate controlled on 6/8  ECG on 5/29 reviewed-showing A. fib, repeat ECG for chest discomfort  Monitor with increased mobility 9.    Steroid-induced hyperglycemia on diabetes mellitus.  Hemoglobin A1c 8.8.  Sliding scale insulin.  NovoLog 3 3 times daily started on 6/8  Labile on 6/8  Monitor with increased mobility 10.  Rheumatoid arthritis.  Patient on Enbrel 50 mg injection weekly and can be resumed as outpatient.  Continue chronic prednisone 5 mg daily 11.  AKI.  Follow-up renal services  Appreciate nephro recs, adjusting diuretics             Creatinine 1.42 on 6/8  Encourage fluids  Continue to monitor 12.  Hypoalbuminemia  Supplement initiated on 6/4 13.  Leukocytosis-likely steroid-induced +/- C. difficile  WBCs 11.4 on 6/4, labs pending  Afebrile  Continue to monitor 14.?  Acute blood loss anemia  Hemoglobin 9.3 on 6/8, precipitously trending down, labs ordered for tomorrow  Occult ordered  Continue to monitor  15.  Lumbar back pain  Secondary degenerative disc disease as well as facet arthropathy of lumbar spine  L-spine x-ray reviewed suggesting degenerative changes  Controlled on 6/8 16.  Hypokalemia  Potassium 3.9 on 6/8  Likely secondary to diuresis 17.  Hypophosphatemia  Supplemented x1 dose on 6/8 18.  Shortness of breath  Chest x-ray from 5/31 reviewed, showing bilateral small pleural effusions and atelectasis  Follow-up x-ray ordered  LOS: 5 days A FACE TO FACE EVALUATION WAS PERFORMED  Ankit Lorie Phenix 08/11/2018, 9:17 AM

## 2018-08-11 NOTE — Progress Notes (Signed)
Physical Therapy Session Note  Patient Details  Name: Shelby Walker MRN: [id number] Date of Birth: 06/02/1933  Today's Date: 08/11/2018 PT Individual Time: 7564-3329 PT Individual Time Calculation (min): 43 min   Short Term Goals: Week 1:  PT Short Term Goal 1 (Week 1): Patient to ambulate 41' with RW and mod A PT Short Term Goal 2 (Week 1): Patient to tolerate standing 3 minutes for functional task PT Short Term Goal 3 (Week 1): Patient to initiate stair training. PT Short Term Goal 4 (Week 1): Patient to propel w/c 30' with S.  Skilled Therapeutic Interventions/Progress Updates:  Pt in bed with eyes closed. Agreed to PT. Kept eyes closed until seated at edge of bed.  BP before session supine in bed- 94/36. Had pt perform the following ex's with bil LE's- ankle pumps x 20 reps active, quad sets with 5 sec holds for 10 reps, straight leg raises for 10 reps. BP recheck with 92/36. PTA applied bil TED hose and ace wraps to bil LE's with pt assisting by raising legs up, unable to sustain without assistance. Pt requesting not to have any of these donned, compromised by only using one ace wrap on each leg as long as BP permitted this. Raised HOB to 50 degrees with BP reading of 98/40. Had pt transition from here to sitting edge of bed. Mild dizziness reported with BP reading of 110/40. Seated edge of bed with supervision pt performed heel/toe raises and long arc quads for 10 reps each bil LE's with cues for slow and controlled movements. No change in dizziness reported. Pt stood from bed to RW with min guard assist and ambulated 5-6 feet to recliner with RW min guard assist. Supervision for controlled sitting into recliner. BP checked with reading of 105/38. Standing with RW support pt performed alternating marching, alternating hip abduction and mini squats for 10 reps each bil LE's. Seated rest break between each ex with min guard assist for all sit<>stand transfers.  Pt left in chair with belt alarm  on, feet up and all needs in reach. BP at end of session 95/38. RN made aware of soft BP and response to activity today.     Therapy Documentation Precautions:  Precautions Precautions: Fall Precaution Comments: left knee buckles Restrictions Weight Bearing Restrictions: No    Therapy/Group: Individual Therapy  Willow Ora, PTA 08/11/2018, 1:03 PM

## 2018-08-11 NOTE — Progress Notes (Signed)
Occupational Therapy Session Note  Patient Details  Name: Shelby Walker MRN: [id number] Date of Birth: April 27, 1933  Today's Date: 08/11/2018 OT Individual Time: 1300-1400 OT Individual Time Calculation (min): 60 min    Short Term Goals: Week 1:  OT Short Term Goal 1 (Week 1): Patient will completed UB and LB bathing and dressing at Supervision level using LRAD. OT Short Term Goal 2 (Week 1): Patient will complete toilet transfers at Supervision with DME if needed.  OT Short Term Goal 3 (Week 1): Patient will increase sitting tolerance by completing a task for 15 minutes or more in order to participate in ADL with less rest breaks. OT Short Term Goal 4 (Week 1): Patient will complete simulated walk-in shower transfer at Supervision level with Shower seat if needed. OT Short Term Goal 5 (Week 1): Patient will increase BUE strength and endurance to 4-/5 to increase participation in ADL tasks and functional transfers.   Skilled Therapeutic Interventions/Progress Updates:    1:1. Pt received in bed. Pain reported but not rated in back. Pt, RN and OT discuss mood, lack of appetite and depression. Pt reports feeling sad. OT provides support, encouragement and reminder to "do it for your family" and pt smiles in appreciation. Supine BP 84/47, EOB 89/54. AE wraps and teds on. Stand pivot transfer with CGA to w/c and kpad applied to back. Pt completes dowel rod circuit with 3# dowel rod for global strengthening of BUE and endurance requried for BADLs. Pt eats 1/4 of meal before feeling "stufffed" and discusses favorite leisure activities such as playing cards at church. Pt missed 15 min of tx time d/t needing to go to X ray. Exited session with direct handoff to xray tech in w/c.  Therapy Documentation Precautions:  Precautions Precautions: Fall Precaution Comments: left knee buckles Restrictions Weight Bearing Restrictions: No General:   Vital Signs:  Pain: Pain Assessment Pain Scale: 0-10 Pain  Score: 4  Pain Type: Chronic pain Pain Location: Back Pain Orientation: Lower Pain Descriptors / Indicators: Aching Pain Intervention(s): Repositioned ADL: ADL Eating: Modified independent Where Assessed-Eating: Edge of bed Grooming: Setup Where Assessed-Grooming: Sitting at sink Upper Body Bathing: Setup Where Assessed-Upper Body Bathing: Sitting at sink Lower Body Bathing: Minimal assistance Where Assessed-Lower Body Bathing: Sitting at sink Upper Body Dressing: Minimal assistance Where Assessed-Upper Body Dressing: Sitting at sink Lower Body Dressing: Minimal assistance Where Assessed-Lower Body Dressing: Sitting at sink Toileting: Moderate assistance Where Assessed-Toileting: Glass blower/designer: Moderate assistance Toilet Transfer Method: Stand pivot Vision   Perception    Praxis   Exercises:   Other Treatments:     Therapy/Group: Individual Therapy  Tonny Branch 08/11/2018, 12:56 PM

## 2018-08-11 NOTE — Progress Notes (Signed)
Patient has a poor appetite. Patient rarely eats more than 1/3 of any food offered. RN educated patient about the importance of nutrition. Patient reports feeling sad and stuffed all the time. Support and encouragement provided.  MD made aware. Will continue to monitor patient.   .BP (!) 94/47 (BP Location: Left Arm)   Pulse 80   Temp 98.9 F (37.2 C) (Oral)   Resp 16   Ht 5\' 1"  (1.549 m)   Wt 58.3 kg   SpO2 100%   BMI 24.29 kg/m

## 2018-08-12 ENCOUNTER — Inpatient Hospital Stay (HOSPITAL_COMMUNITY): Payer: Medicare Other

## 2018-08-12 ENCOUNTER — Inpatient Hospital Stay (HOSPITAL_COMMUNITY): Payer: Medicare Other | Admitting: Physical Therapy

## 2018-08-12 ENCOUNTER — Encounter (HOSPITAL_COMMUNITY): Payer: Medicare Other | Admitting: Psychology

## 2018-08-12 DIAGNOSIS — I952 Hypotension due to drugs: Secondary | ICD-10-CM

## 2018-08-12 DIAGNOSIS — R001 Bradycardia, unspecified: Secondary | ICD-10-CM

## 2018-08-12 LAB — CBC WITH DIFFERENTIAL/PLATELET
Abs Immature Granulocytes: 0.06 10*3/uL (ref 0.00–0.07)
Basophils Absolute: 0 10*3/uL (ref 0.0–0.1)
Basophils Relative: 0 %
Eosinophils Absolute: 0.2 10*3/uL (ref 0.0–0.5)
Eosinophils Relative: 2 %
HCT: 29.5 % — ABNORMAL LOW (ref 36.0–46.0)
Hemoglobin: 9.1 g/dL — ABNORMAL LOW (ref 12.0–15.0)
Immature Granulocytes: 1 %
Lymphocytes Relative: 28 %
Lymphs Abs: 1.9 10*3/uL (ref 0.7–4.0)
MCH: 28.9 pg (ref 26.0–34.0)
MCHC: 30.8 g/dL (ref 30.0–36.0)
MCV: 93.7 fL (ref 80.0–100.0)
Monocytes Absolute: 0.6 10*3/uL (ref 0.1–1.0)
Monocytes Relative: 10 %
Neutro Abs: 3.9 10*3/uL (ref 1.7–7.7)
Neutrophils Relative %: 59 %
Platelets: 182 10*3/uL (ref 150–400)
RBC: 3.15 MIL/uL — ABNORMAL LOW (ref 3.87–5.11)
RDW: 17 % — ABNORMAL HIGH (ref 11.5–15.5)
WBC: 6.6 10*3/uL (ref 4.0–10.5)
nRBC: 0 % (ref 0.0–0.2)

## 2018-08-12 LAB — GLUCOSE, CAPILLARY
Glucose-Capillary: 167 mg/dL — ABNORMAL HIGH (ref 70–99)
Glucose-Capillary: 143 mg/dL — ABNORMAL HIGH (ref 70–99)
Glucose-Capillary: 211 mg/dL — ABNORMAL HIGH (ref 70–99)
Glucose-Capillary: 200 mg/dL — ABNORMAL HIGH (ref 70–99)

## 2018-08-12 LAB — RENAL FUNCTION PANEL
Albumin: 2.2 g/dL — ABNORMAL LOW (ref 3.5–5.0)
Anion gap: 10 (ref 5–15)
BUN: 50 mg/dL — ABNORMAL HIGH (ref 8–23)
CO2: 33 mmol/L — ABNORMAL HIGH (ref 22–32)
Calcium: 8 mg/dL — ABNORMAL LOW (ref 8.9–10.3)
Chloride: 98 mmol/L (ref 98–111)
Creatinine, Ser: 1.53 mg/dL — ABNORMAL HIGH (ref 0.44–1.00)
GFR calc Af Amer: 36 mL/min — ABNORMAL LOW (ref >60)
GFR calc non Af Amer: 31 mL/min — ABNORMAL LOW (ref >60)
Glucose, Bld: 160 mg/dL — ABNORMAL HIGH (ref 70–99)
Phosphorus: 2 mg/dL — ABNORMAL LOW (ref 2.5–4.6)
Potassium: 4.1 mmol/L (ref 3.5–5.1)
Sodium: 141 mmol/L (ref 135–145)

## 2018-08-12 LAB — MAGNESIUM: Magnesium: 2 mg/dL (ref 1.7–2.4)

## 2018-08-12 LAB — OCCULT BLOOD X 1 CARD TO LAB, STOOL: Fecal Occult Bld: NEGATIVE

## 2018-08-12 MED ORDER — MEGESTROL ACETATE 400 MG/10ML PO SUSP
400.0000 mg | Freq: Every day | ORAL | Status: DC
  Administered 2018-08-12 – 2018-08-23 (×12): 400 mg via ORAL
  Filled 2018-08-12 (×12): qty 10

## 2018-08-12 NOTE — Progress Notes (Signed)
Physical Therapy Session Note  Patient Details  Name: Shelby Walker MRN: [id number] Date of Birth: 12-Apr-1933  Today's Date: 08/12/2018 PT Individual Time: 1130-1220 PT Individual Time Calculation (min): 50 min   Short Term Goals: Week 1:  PT Short Term Goal 1 (Week 1): Patient to ambulate 61' with RW and mod A PT Short Term Goal 2 (Week 1): Patient to tolerate standing 3 minutes for functional task PT Short Term Goal 3 (Week 1): Patient to initiate stair training. PT Short Term Goal 4 (Week 1): Patient to propel w/c 75' with S.  Skilled Therapeutic Interventions/Progress Updates:   pt in bed, c/o fatigue but agreeable to therapy.  Sit <> stand with pt report some "wooziness".  BP 125/50 seated, 112/48 standing.  Pt reports decreased symptoms after standing x 1 minute.  Pt able to perform gait 3 x 25' with RW and supervision.  Sit <> Stand with min A.  Standing marching, hip abd and heel raises all 2 x 15.  Seated hip flex, LAQ, HS curl, hip abd, add squeeze all 2 x 15.  Pt left in recliner with alarm set, needs at hand.   Therapy Documentation Precautions:  Precautions Precautions: Fall Precaution Comments: left knee buckles Restrictions Weight Bearing Restrictions: No Pain: No c/o pain during session   Therapy/Group: Individual Therapy  Smantha Boakye 08/12/2018, 1:16 PM

## 2018-08-12 NOTE — Progress Notes (Signed)
Remains on Enteric Precaution -C-Diff, no acute distress or discomfort, still need stool for hemoccult,no acute distress

## 2018-08-12 NOTE — Progress Notes (Signed)
Occupational Therapy Session Note  Patient Details  Name: Shelby Walker MRN: [id number] Date of Birth: [birth year]  Today's Date: 08/12/2018 OT Individual Time: 7680-8811 OT Individual Time Calculation (min): 75 min    Short Term Goals: Week 1:  OT Short Term Goal 1 (Week 1): Patient will completed UB and LB bathing and dressing at Supervision level using LRAD. OT Short Term Goal 2 (Week 1): Patient will complete toilet transfers at Supervision with DME if needed.  OT Short Term Goal 3 (Week 1): Patient will increase sitting tolerance by completing a task for 15 minutes or more in order to participate in ADL with less rest breaks. OT Short Term Goal 4 (Week 1): Patient will complete simulated walk-in shower transfer at Supervision level with Shower seat if needed. OT Short Term Goal 5 (Week 1): Patient will increase BUE strength and endurance to 4-/5 to increase participation in ADL tasks and functional transfers.   Skilled Therapeutic Interventions/Progress Updates:    Pt sitting in recliner upon therapy arrival and agreeable to take a shower this date. Patient ambulated from recliner to shower while sitting on fold out bench with Min assist. Patient completed all bathing while seated with the exception of buttocks and front peri area. Patient required Mod assist to complete a sit to stand from bench due to low level. Once finished and dry, patient ambulated to bed and sat on the edge to complete dressing. Therapist assisted with TED hose due to difficulty. Patient was able to complete all other aspects of dressing with supervision. Once finished, patient transitioned to supine on bed to rest without assistance. Bed alarm set and call light within reach upon leaving.   Therapy Documentation Precautions:  Precautions Precautions: Fall Precaution Comments: left knee buckles Restrictions Weight Bearing Restrictions: No   Pain: Pain Assessment Pain Scale: 0-10 Pain Score: (no pain score  given.) Pain Type: Chronic pain Pain Location: Back Pain Orientation: Mid Pain Descriptors / Indicators: Aching;Discomfort Pain Onset: On-going Pain Intervention(s): Heat applied   Therapy/Group: Individual Therapy  Ailene Ravel, OTR/L,CBIS  [phone]  08/12/2018, 12:57 PM

## 2018-08-12 NOTE — Progress Notes (Signed)
Consumed only a small amount of food sent in by family and po intake was fair, encourage Ensure but refused

## 2018-08-12 NOTE — Consult Note (Signed)
Neuropsychological Consultation   Patient:   Shelby Walker   DOB:   [birth year]  MR Number:  [id number]  Location:  [place] [address] [facility] [place] [id number] [place] [address] Dept: [place]: [phone]           Date of Service:   08/12/2018  Start Time:   2 PM End Time:   3 PM  Provider/Observer:  Ilean Skill, Psy.D.       Clinical Neuropsychologist       Billing Code/Service: [phone]  Chief Complaint:    Shelby Walker is an 85 year old female with history of diabetes, hypertension, RA.  Presented on 07/24/2018 with nausea, vomiting, diarrhea.  Recent care due to diagnosis of infectious colitis and completed 7 day course of Cipro and Flagyl without improvement.  CT noted diffuse colitis.  Shelby Walker positive.  Patient was admitted for comprehensive rehab program due to debility.    Reason for Service:  Patient was referred for neuropsychological consultation due to coping and adjustment issues around pain issues and extended hospital course.  Below is the HPI for the current admission.  HPI: Shelby Walker is an 85 year old right-handed female with history of diabetes mellitus, hypertension, rheumatoid arthritis maintained on Enbrel as well as prednisone.  Per chart review and patient, patient lives with daughter and son-in-law.  Independent prior to admission.  Two-level home with bedroom on main level.  Presented 07/24/2018 with nausea, vomiting, diarrhea.  She was recently seen at urgent care diagnosed with infectious colitis and completed 7-day course of Cipro and Flagyl without improvement.  She was noted to have SIRS on evaluation in the ED.  Noted fever of 100.5, heart rate 104, WBC 15,300, BUN 23, creatinine 1.22.  Urinalysis negative nitrite, COVID negative, troponin negative.  Chest x-ray unremarkable.  CT abdomen pelvis noted diffuse colitis without perforation or abscess.  C. difficile specimen was positive  and maintained on vancomycin with contact precautions.  Hospital course complicated by PAF, placed on amiodarone converted back to sinus rhythm.  Echocardiogram with ejection fraction of 65%.  Eliquis was initiated for PAF as well as Lopressor.  Renal services consulted 07/30/2018 for elevated creatinine 3.98 from baseline 1.22.  Renal ultrasound showed no hydronephrosis.  There was some new mild to moderate abdominal ascites.  Patient responded well to Lasix therapy and latest creatinine 1.91.  Therapy evaluations completed and patient was admitted for a comprehensive rehab program.  Please see preadmission assessment from today as well.  Current Status:  The patient reports depressive symptoms and it has been hard for her with extended hospital stay with pain etc and not being able to see family.  Patient reports that the degree of depression   Behavioral Observation: Shelby Walker  presents as a 85 y.o.-year-old Right Caucasian Female who appeared her stated age. her dress was Appropriate and she was Well Groomed and her manners were Appropriate to the situation.  her participation was indicative of Appropriate and Attentive behaviors.  There were any physical disabilities noted.  she displayed an appropriate level of cooperation and motivation.     Interactions:    Active Appropriate and Attentive  Attention:   within normal limits and attention span and concentration were age appropriate  Memory:   within normal limits; recent and remote memory intact  Visuo-spatial:  not examined  Speech (Volume):  low  Speech:   normal; normal  Thought Process:  Coherent  and Relevant  Though Content:  WNL; not suicidal and not homicidal  Orientation:   person, place, time/date and situation  Judgment:   Good  Planning:   Good  Affect:    Appropriate  Mood:    Dysphoric  Insight:   Good  Intelligence:   normal  Medical History:   Past Medical History:  Diagnosis Date  . Arthritis,  rheumatoid ([place])   . Bruit    Carotid Doppler showed no significant abnormality     9per patient)  . Chest pain, unspecified    Nuclear, May, 2008, no scar or ischemia  . Diabetes mellitus   . Dyslipidemia   . Ejection fraction    EF 55-60%, echo, February, 2011  . GERD (gastroesophageal reflux disease)   . Mitral regurgitation April 21, 2009   mild,  echo, February, 2011  . Osteoporosis   . Palpitations    possible very brief atrial fibrillation on monitor and possible reentrant tachycardia  . Psoriasis   . Rheumatoid arthritis [facility])         Psychiatric History:  Patient denies prior psychiatric symptoms or depressive symptoms.    Family Med/Psych History:  Family History  Problem Relation Age of Onset  . Heart failure Father   . Heart attack Father   . Diabetes Father   . Heart attack Mother   . Heart failure Mother   . Diabetes Mother   . Stroke Sister    Impression/DX:  Shelby Walker is an 85 year old female with history of diabetes, hypertension, RA.  Presented on 07/24/2018 with nausea, vomiting, diarrhea.  Recent care due to diagnosis of infectious colitis and completed 7 day course of Cipro and Flagyl without improvement.  CT noted diffuse colitis.  Shelby Walker positive.  Patient was admitted for comprehensive rehab program due to debility.    The patient reports depressive symptoms and it has been hard for her with extended hospital stay with pain etc and not being able to see family.  Patient reports that the degree of depression   Disposition/Plan:  Worked on coping and adjustment issues.         Electronically Signed   _______________________ Ilean Skill, Psy.D.

## 2018-08-12 NOTE — Progress Notes (Signed)
Stool to lab for hemoccult per order

## 2018-08-12 NOTE — Progress Notes (Signed)
Physical Therapy Session Note  Patient Details  Name: Shelby Walker MRN: [id number] Date of Birth: 01-31-1934  Today's Date: 08/12/2018 PT Individual Time: 0420-0505 PT Individual Time Calculation (min): 45 min   Short Term Goals: Week 1:  PT Short Term Goal 1 (Week 1): Patient to ambulate 12' with RW and mod A PT Short Term Goal 2 (Week 1): Patient to tolerate standing 3 minutes for functional task PT Short Term Goal 3 (Week 1): Patient to initiate stair training. PT Short Term Goal 4 (Week 1): Patient to propel w/c 34' with S.  Skilled Therapeutic Interventions/Progress Updates:   Pt received asleep, supine in bed and states "I'm kind of out of it" and then "good luck" when talking about length of therapy session, but pt agreeable to participate stating "I want to do whatever you want me to do." Pt wearing B LE thigh highTED hose throughout session. Pt lethargic throughout session frequently having eyes closed and quick to dose off but easily aroused. Vitals assessed in supine BP 95/46 (MAP 61), HR 71bpm. Performed supine B LE heel slides with intermittent active assist to increased ROM 2x10 repetitions each LE - cuing and manual facilitation for technique. Rolling R/L using bedrails with supervision for safety and cuing for technique - pt educated on importance of sidelying pressure relief for her sacrum. Performed sidelying clamshells each LE and supine bridging with B LEs 2x10 repetitions each - cuing and manual facilitation for technique/form. Reassessed vitals sidelying in bed after exercises: BP 95/48 (MAP 63), HR 79bpm. Deferred attempting supine>sit due to low BP, low MAP, and pt's level of fatigue with frequently keeping eyes closed and falling asleep. Performed supine straight leg raise with active assist due to impaired hip flexor strength and poor eccentric control 2x10 repetitions each LE - cuing and manual facilitation to maintain knee extension during movement. Supine B LE ankle DF  against level 2 (orange) theraband resistance x10 repetitions each. Pt left R sidelying in bed with needs in reach, bed alarm on, and RN notified of pt's vitals during therapy session.  Therapy Documentation Precautions:  Precautions Precautions: Fall Precaution Comments: left knee buckles Restrictions Weight Bearing Restrictions: No  Vital Signs: Therapy Vitals Pulse Rate: 76 Resp: 16 BP: (!) 95/46 Patient Position (if appropriate): Lying  Pain: Reports pain/soreness located at "My fanny" and reported level of "not that bad" at beginning of session - educated pt on sidelying pressure relief and performed sidelying activity for pain relief with pt reporting some improvement.   Therapy/Group: Individual Therapy  Tawana Scale, PT, DPT 08/12/2018, 4:26 PM

## 2018-08-12 NOTE — Progress Notes (Signed)
Physical Therapy Session Note  Patient Details  Name: Shelby Walker MRN: [id number] Date of Birth: 1933/09/05  Today's Date: 08/12/2018 PT Individual Time: 0840-0905 PT Individual Time Calculation (min): 25 min   Short Term Goals: Week 1:  PT Short Term Goal 1 (Week 1): Patient to ambulate 61' with RW and mod A PT Short Term Goal 2 (Week 1): Patient to tolerate standing 3 minutes for functional task PT Short Term Goal 3 (Week 1): Patient to initiate stair training. PT Short Term Goal 4 (Week 1): Patient to propel w/c 43' with S.  Skilled Therapeutic Interventions/Progress Updates:    Patient in supine finishing her breakfast.  Reports had better appetite this morning. Supine for assist to don THT and pt refused ace wraps due to feeling pain on legs from them.  Supine to sit with S with rail.  Seated for LAQ and heel raises x 10.  Sit to stand min A and ambulated with RW 20' to bathroom with min a.  Toileted and sit to stand with grabbar with cues and min A.  Patient performed hygiene with min A for balance. Donned pants and brief min A.  Patient ambulated to sink and washed hands with minguard A for balance.  Ambulated to recliner with min A with RW.  Seated with alarm belt, heating pad to back and legs elevated with needs in reach and RN in room.    Therapy Documentation Precautions:  Precautions Precautions: Fall Precaution Comments: left knee buckles Restrictions Weight Bearing Restrictions: No Pain: Pain Assessment Pain Scale: 0-10 Pain Score: 7  Pain Type: Chronic pain Pain Location: Back Pain Orientation: Lower Pain Descriptors / Indicators: Aching;Discomfort Pain Onset: On-going Pain Intervention(s): Repositioned    Therapy/Group: Individual Therapy  Reginia Naas  Magda Kiel, PT 08/12/2018, 8:38 AM

## 2018-08-12 NOTE — Progress Notes (Signed)
[facility] PHYSICAL MEDICINE & REHABILITATION PROGRESS NOTE  Subjective/Complaints: Patient seen sitting up at the edge of her bed this morning.  Good sitting balance.  She states she slept well overnight.  ROS: Denies CP, shortness of breath, nausea, vomiting, diarrhea.  Objective: Vital Signs: Blood pressure (!) 93/50, pulse 72, temperature 98.1 F (36.7 C), temperature source Oral, resp. rate 17, height 5\' 1"  (1.549 m), weight 59 kg, SpO2 98 %. Dg Chest 2 View  Result Date: 08/11/2018 CLINICAL DATA:  Weakness EXAM: CHEST - 2 VIEW COMPARISON:  08/03/2018 FINDINGS: Cardiac shadow is stable. Aortic calcifications are noted. The right lung is clear. Small left pleural effusion is noted similar to that seen on the prior exam. No acute bony abnormality is noted. IMPRESSION: Small left pleural effusion stable from the prior exam. Electronically Signed   By: Inez Catalina M.D.   On: 08/11/2018 14:20   Recent Labs    08/11/18 0623 08/12/18 0536  WBC 7.7 6.6  HGB 9.3* 9.1*  HCT 29.8* 29.5*  PLT 190 182   Recent Labs    08/11/18 0615 08/12/18 0536  NA 141 141  K 3.9 4.1  CL 97* 98  CO2 31 33*  GLUCOSE 218* 160*  BUN 53* 50*  CREATININE 1.42* 1.53*  CALCIUM 8.1* 8.0*    Physical Exam: BP (!) 93/50 (BP Location: Right Arm)   Pulse 72   Temp 98.1 F (36.7 C) (Oral)   Resp 17   Ht 5\' 1"  (1.549 m)   Wt 59 kg   SpO2 98%   BMI 24.58 kg/m  Constitutional: No distress . Vital signs reviewed. HENT: Normocephalic.  Atraumatic. Eyes: EOMI.  No discharge. Cardiovascular: No JVD. Respiratory: Normal effort.  GI: Non-distended. Musc: Joint deformities due to RA, stable Neurological: She is alert.  Alert Follows commands.   HOH Motor: Right upper extremity: 5/5 proximal distal, unchanged Right lower extremity: 4-4+/5 proximal distal, unchanged Left upper extremity: 4-4+/5 proximal distal, unchanged Left lower extremity: 4/5 proximal to distal (pain inhibition) Skin: Skin is  warm and dry.  Psychiatric: Her affect is blunt.    Assessment/Plan: 1. Functional deficits secondary to debility which require 3+ hours per day of interdisciplinary therapy in a comprehensive inpatient rehab setting.  Physiatrist is providing close team supervision and 24 hour management of active medical problems listed below.  Physiatrist and rehab team continue to assess barriers to discharge/monitor patient progress toward functional and medical goals  Care Tool:  Bathing    Body parts bathed by patient: Right arm, Left arm, Chest, Abdomen, Right upper leg, Left upper leg, Face   Body parts bathed by helper: Front perineal area, Buttocks, Right lower leg, Left lower leg     Bathing assist Assist Level: Minimal Assistance - Patient > 75%     Upper Body Dressing/Undressing Upper body dressing   What is the patient wearing?: Pull over shirt    Upper body assist Assist Level: Minimal Assistance - Patient > 75%    Lower Body Dressing/Undressing Lower body dressing      What is the patient wearing?: Underwear/pull up, Pants     Lower body assist Assist for lower body dressing: Moderate Assistance - Patient 50 - 74%     Toileting Toileting    Toileting assist Assist for toileting: Minimal Assistance - Patient > 75%     Transfers Chair/bed transfer  Transfers assist     Chair/bed transfer assist level: Minimal Assistance - Patient > 75%  Locomotion Ambulation   Ambulation assist      Assist level: Minimal Assistance - Patient > 75% Assistive device: Walker-rolling Max distance: 20'   Walk 10 feet activity   Assist  Walk 10 feet activity did not occur: Safety/medical concerns(Per PT)  Assist level: Minimal Assistance - Patient > 75% Assistive device: Walker-rolling   Walk 50 feet activity   Assist Walk 50 feet with 2 turns activity did not occur: Safety/medical concerns(Per PT)         Walk 150 feet activity   Assist Walk 150 feet  activity did not occur: Safety/medical concerns(Per PT)         Walk 10 feet on uneven surface  activity   Assist Walk 10 feet on uneven surfaces activity did not occur: Safety/medical concerns(Per PT)         Wheelchair     Assist               Wheelchair 50 feet with 2 turns activity    Assist    Wheelchair 50 feet with 2 turns activity did not occur: Safety/medical concerns(Per PT)       Wheelchair 150 feet activity     Assist Wheelchair 150 feet activity did not occur: Safety/medical concerns(Per PT)          Medical Problem List and Plan: 1.  Debility secondary to septic shock due to C. difficile colitis (completed course of vancomycin).  Contact precautions  Continue CIR 2.  Antithrombotics: -DVT/anticoagulation: Eliquis             -antiplatelet therapy: N/A 3. Pain Management: Oxycodone as needed 4. Mood: Provide emotional support             -antipsychotic agents: N/A 5. Neuropsych: This patient is ?  Fully capable of making decisions on her own behalf. 6. Skin/Wound Care: Routine skin checks 7. Fluids/Electrolytes/Nutrition: Routine in and outs  Megace ordered on 6/9 due to poor appetite. Discussed with PA, prolonged discussion with daughter yesterday regarding medical progress.  8.  New onset PAF.  Continue Eliquis.    Lopressor 12.5 mg twice daily, changed to Coreg on 6/8 due to hypotension, DC'd on 6/9 due to hypotension/bradycardia, however normal HR on ECG.  Will cont to monitor.             Bradycardia on 6/9  ECG on 5/29 reviewed-showing A. fib, repeat ECG reviewed-normal  Monitor with increased mobility 9.    Steroid-induced hyperglycemia on diabetes mellitus.  Hemoglobin A1c 8.8.  Sliding scale insulin.  NovoLog 3 3 times daily started on 6/8  Labile, but?  Improving on 6/9, will consider further medication adjustments if necessary-will likely require medication  Monitor with increased mobility 10.  Rheumatoid arthritis.   Patient on Enbrel 50 mg injection weekly and can be resumed as outpatient.  Continue chronic prednisone 5 mg daily 11.  AKI vs CKD.    Appreciate nephro recs, adjusting diuretics             Creatinine 1.53 on 6/9, slowly improving  Encourage fluids  Continue to monitor 12.  Hypoalbuminemia  Supplement initiated on 6/4 13.  Leukocytosis: Resolved-likely steroid-induced +/- C. difficile  WBCs 6.6 on 6/9  Afebrile  Continue to monitor 14. Acute blood loss anemia  Hemoglobin 9.1 on 6/9, trending down, labs ordered for tomorrow  Occult pending  Continue to monitor  15.  Lumbar back pain  Secondary degenerative disc disease as well as facet arthropathy of lumbar spine  L-spine x-ray reviewed suggesting degenerative changes  Controlled on 6/8 16.  Hypokalemia  Potassium 4.1 on 6/9 17.  Hypophosphatemia  Supplemented x1 dose on 6/8 18.  Shortness of breath: Resolved  Chest x-ray from 5/31 reviewed, showing bilateral small pleural effusions and atelectasis  Follow-up x-ray reviewed, stable  LOS: 6 days A FACE TO FACE EVALUATION WAS PERFORMED  Latrecia Capito Lorie Phenix 08/12/2018, 9:20 AM

## 2018-08-13 ENCOUNTER — Inpatient Hospital Stay (HOSPITAL_COMMUNITY): Payer: Medicare Other

## 2018-08-13 ENCOUNTER — Inpatient Hospital Stay (HOSPITAL_COMMUNITY): Payer: Medicare Other | Admitting: Physical Therapy

## 2018-08-13 LAB — RENAL FUNCTION PANEL
Albumin: 2.4 g/dL — ABNORMAL LOW (ref 3.5–5.0)
Anion gap: 9 (ref 5–15)
BUN: 44 mg/dL — ABNORMAL HIGH (ref 8–23)
CO2: 31 mmol/L (ref 22–32)
Calcium: 8.4 mg/dL — ABNORMAL LOW (ref 8.9–10.3)
Chloride: 100 mmol/L (ref 98–111)
Creatinine, Ser: 1.29 mg/dL — ABNORMAL HIGH (ref 0.44–1.00)
GFR calc Af Amer: 44 mL/min — ABNORMAL LOW (ref >60)
GFR calc non Af Amer: 38 mL/min — ABNORMAL LOW (ref >60)
Glucose, Bld: 170 mg/dL — ABNORMAL HIGH (ref 70–99)
Phosphorus: 2.3 mg/dL — ABNORMAL LOW (ref 2.5–4.6)
Potassium: 3.9 mmol/L (ref 3.5–5.1)
Sodium: 140 mmol/L (ref 135–145)

## 2018-08-13 LAB — CBC WITH DIFFERENTIAL/PLATELET
Abs Immature Granulocytes: 0.02 10*3/uL (ref 0.00–0.07)
Basophils Absolute: 0 10*3/uL (ref 0.0–0.1)
Basophils Relative: 0 %
Eosinophils Absolute: 0.1 10*3/uL (ref 0.0–0.5)
Eosinophils Relative: 2 %
HCT: 31.1 % — ABNORMAL LOW (ref 36.0–46.0)
Hemoglobin: 9.6 g/dL — ABNORMAL LOW (ref 12.0–15.0)
Immature Granulocytes: 0 %
Lymphocytes Relative: 29 %
Lymphs Abs: 1.8 10*3/uL (ref 0.7–4.0)
MCH: 28.7 pg (ref 26.0–34.0)
MCHC: 30.9 g/dL (ref 30.0–36.0)
MCV: 93.1 fL (ref 80.0–100.0)
Monocytes Absolute: 0.5 10*3/uL (ref 0.1–1.0)
Monocytes Relative: 9 %
Neutro Abs: 3.9 10*3/uL (ref 1.7–7.7)
Neutrophils Relative %: 60 %
Platelets: 210 10*3/uL (ref 150–400)
RBC: 3.34 MIL/uL — ABNORMAL LOW (ref 3.87–5.11)
RDW: 17.2 % — ABNORMAL HIGH (ref 11.5–15.5)
WBC: 6.4 10*3/uL (ref 4.0–10.5)
nRBC: 0 % (ref 0.0–0.2)

## 2018-08-13 LAB — MAGNESIUM: Magnesium: 2.2 mg/dL (ref 1.7–2.4)

## 2018-08-13 LAB — GLUCOSE, CAPILLARY
Glucose-Capillary: 150 mg/dL — ABNORMAL HIGH (ref 70–99)
Glucose-Capillary: 182 mg/dL — ABNORMAL HIGH (ref 70–99)
Glucose-Capillary: 145 mg/dL — ABNORMAL HIGH (ref 70–99)
Glucose-Capillary: 179 mg/dL — ABNORMAL HIGH (ref 70–99)

## 2018-08-13 NOTE — Progress Notes (Signed)
Hemoccult stool #2 and sent to lab

## 2018-08-13 NOTE — Progress Notes (Signed)
Hemoccult stool results Negative

## 2018-08-13 NOTE — Plan of Care (Signed)
  Problem: Consults Goal: RH GENERAL PATIENT EDUCATION Description See Patient Education module for education specifics. Outcome: Progressing   Problem: RH SKIN INTEGRITY Goal: RH STG MAINTAIN SKIN INTEGRITY WITH ASSISTANCE Description STG Maintain Skin Integrity With min Assistance.  Outcome: Progressing   Problem: RH SAFETY Goal: RH STG ADHERE TO SAFETY PRECAUTIONS W/ASSISTANCE/DEVICE Description STG Adhere to Safety Precautions With supervision Assistance/Device.  Outcome: Progressing   Problem: RH PAIN MANAGEMENT Goal: RH STG PAIN MANAGED AT OR BELOW PT'S PAIN GOAL Description <3 out of 10.   Outcome: Progressing

## 2018-08-13 NOTE — Progress Notes (Signed)
[facility] PHYSICAL MEDICINE & REHABILITATION PROGRESS NOTE  Subjective/Complaints: Patient with poor endurance, her stools are doing better she has occasional abdominal pain.  ROS: Denies CP, shortness of breath, nausea, vomiting, diarrhea.  Objective: Vital Signs: Blood pressure (!) 124/31, pulse 71, temperature 98 F (36.7 C), resp. rate 18, height _0  (1.549 m), weight 59 kg, SpO2 100 %. Dg Chest 2 View  Result Date: 08/11/2018 CLINICAL DATA:  Weakness EXAM: CHEST - 2 VIEW COMPARISON:  08/03/2018 FINDINGS: Cardiac shadow is stable. Aortic calcifications are noted. The right lung is clear. Small left pleural effusion is noted similar to that seen on the prior exam. No acute bony abnormality is noted. IMPRESSION: Small left pleural effusion stable from the prior exam. Electronically Signed   By: Inez Catalina M.D.   On: 08/11/2018 14:20   Recent Labs    08/12/18 0536 08/13/18 0653  WBC 6.6 6.4  HGB 9.1* 9.6*  HCT 29.5* 31.1*  PLT 182 210   Recent Labs    08/12/18 0536 08/13/18 0653  NA 141 140  K 4.1 3.9  CL 98 100  CO2 33* 31  GLUCOSE 160* 170*  BUN 50* 44*  CREATININE 1.53* 1.29*  CALCIUM 8.0* 8.4*    Physical Exam: BP (!) 124/31 (BP Location: Left Arm)   Pulse 71   Temp 98 F (36.7 C)   Resp 18   Ht _1  (1.549 m)   Wt 59 kg   SpO2 100%   BMI 24.58 kg/m  Constitutional: No distress . Vital signs reviewed. HENT: Normocephalic.  Atraumatic. Eyes: EOMI.  No discharge. Cardiovascular: No JVD. Respiratory: Normal effort.  GI: Non-distended. Musc: Joint deformities due to RA, stable Neurological: She is alert.  Alert Follows commands.   HOH Motor: Right upper extremity: 5/5 proximal distal, unchanged Right lower extremity: 4-4+/5 proximal distal, unchanged Left upper extremity: 4-4+/5 proximal distal, unchanged Left lower extremity: 4/5 proximal to distal (pain inhibition) Skin: Skin is warm and dry.  Psychiatric: Her affect is blunt.     Assessment/Plan: 1. Functional deficits secondary to debility which require 3+ hours per day of interdisciplinary therapy in a comprehensive inpatient rehab setting.  Physiatrist is providing close team supervision and 24 hour management of active medical problems listed below.  Physiatrist and rehab team continue to assess barriers to discharge/monitor patient progress toward functional and medical goals  Care Tool:  Bathing    Body parts bathed by patient: Right arm, Left arm, Chest, Abdomen, Right upper leg, Left upper leg, Face   Body parts bathed by helper: Buttocks, Front perineal area, Right lower leg, Left lower leg     Bathing assist Assist Level: Minimal Assistance - Patient > 75%     Upper Body Dressing/Undressing Upper body dressing   What is the patient wearing?: Pull over shirt    Upper body assist Assist Level: Set up assist    Lower Body Dressing/Undressing Lower body dressing      What is the patient wearing?: Underwear/pull up, Pants     Lower body assist Assist for lower body dressing: Minimal Assistance - Patient > 75%     Toileting Toileting    Toileting assist Assist for toileting: Minimal Assistance - Patient > 75%     Transfers Chair/bed transfer  Transfers assist     Chair/bed transfer assist level: Minimal Assistance - Patient > 75%     Locomotion Ambulation   Ambulation assist      Assist level: Minimal Assistance - Patient >  75% Assistive device: Walker-rolling Max distance: 20'   Walk 10 feet activity   Assist  Walk 10 feet activity did not occur: Safety/medical concerns(Per PT)  Assist level: Minimal Assistance - Patient > 75% Assistive device: Walker-rolling   Walk 50 feet activity   Assist Walk 50 feet with 2 turns activity did not occur: Safety/medical concerns(Per PT)         Walk 150 feet activity   Assist Walk 150 feet activity did not occur: Safety/medical concerns(Per PT)         Walk  10 feet on uneven surface  activity   Assist Walk 10 feet on uneven surfaces activity did not occur: Safety/medical concerns(Per PT)         Wheelchair     Assist               Wheelchair 50 feet with 2 turns activity    Assist    Wheelchair 50 feet with 2 turns activity did not occur: Safety/medical concerns(Per PT)       Wheelchair 150 feet activity     Assist Wheelchair 150 feet activity did not occur: Safety/medical concerns(Per PT)          Medical Problem List and Plan: 1.  Debility secondary to septic shock due to C. difficile colitis (completed course of vancomycin).  Contact precautions  Team conference today please see physician documentation under team conference tab, met with team face-to-face to discuss problems,progress, and goals. Formulized individual treatment plan based on medical history, underlying problem and comorbidities.2.  Antithrombotics: -DVT/anticoagulation: Eliquis             -antiplatelet therapy: N/A 3. Pain Management: Oxycodone as needed 4. Mood: Provide emotional support             -antipsychotic agents: N/A 5. Neuropsych: This patient is ?  Fully capable of making decisions on her own behalf. 6. Skin/Wound Care: Routine skin checks 7. Fluids/Electrolytes/Nutrition: Routine in and outs  Megace ordered on 6/9 due to poor appetite. Discussed with PA, prolonged discussion with daughter yesterday regarding medical progress.  8.  New onset PAF.  Continue Eliquis.    Lopressor 12.5 mg twice daily, changed to Coreg on 6/8 due to hypotension, DC'd on 6/9 due to hypotension/bradycardia, however normal HR on ECG.  Will cont to monitor.             Bradycardia on 6/9  ECG on 5/29 reviewed-showing A. fib, repeat ECG reviewed-normal  Monitor with increased mobility 9.    Steroid-induced hyperglycemia on diabetes mellitus.  Hemoglobin A1c 8.8.  Sliding scale insulin.  NovoLog 3 3 times daily started on 6/8   CBG (last 3)   Recent Labs    08/12/18 1708 08/12/18 2216 08/13/18 0641  GLUCAP 211* 200* 150*  Improved this morning will continue to monitor before any other medication changes  Monitor with increased mobility 10.  Rheumatoid arthritis.  Patient on Enbrel 50 mg injection weekly and can be resumed as outpatient.  Continue chronic prednisone 5 mg daily 11.  AKI vs CKD.    Appreciate nephro recs, adjusting diuretics             Creatinine 1.53 on 6/9, slowly improving, 1.29 on 6/10  Encourage fluids  Continue to monitor 12.  Hypoalbuminemia  Supplement initiated on 6/4 13.  Leukocytosis: Resolved-likely steroid-induced +/- C. difficile  WBCs 6.6 on 6/9, 6.4 on 6/10  Afebrile  Continue to monitor 14. Acute blood loss anemia  Hemoglobin 9.1  on 6/9, 9.6 on 6/10  Occult pending  Continue to monitor  15.  Lumbar back pain  Secondary degenerative disc disease as well as facet arthropathy of lumbar spine  L-spine x-ray reviewed suggesting degenerative changes  Controlled on 6/8 16.  Hypokalemia  Potassium 4.1 on 6/9, 3.9 on 6/10 17.  Hypophosphatemia  Supplemented x1 dose on 6/8 18.  Shortness of breath: Resolved  Chest x-ray from 5/31 reviewed, showing bilateral small pleural effusions and atelectasis  Follow-up x-ray reviewed, stable  LOS: 7 days A FACE TO FACE EVALUATION WAS PERFORMED  Charlett Blake 08/13/2018, 9:54 AM

## 2018-08-13 NOTE — Progress Notes (Signed)
Physical Therapy Weekly Progress Note  Patient Details  Name: Shelby Walker MRN: [id number] Date of Birth: [birth year]  Beginning of progress report period: August 07, 2018 End of progress report period: August 13, 2018  Today's Date: 08/13/2018 PT Individual Time: 1000-1100 PT Individual Time Calculation (min): 60 min   Patient seen for 1:1 session.  Seated in recliner, sit to stand min A and stand pivot to w/c min a with RW.  Patient in w/c assisted to hallway and ambulated x 30' x 2 with RW and min A.  Performed side stepping at rail in hallway with min A.  Seated for therex noted below.  Patient side stepping over bar weights on the floor with rail for support.  C/o dizziness looking down at weights to step over.  Patient standing x about 3 minutes to use push pins to create flower design, limited due to back pain.  Patient in w/c propelled about 6' to room with S.  Patient stand step to recliner with min A with RW.  LEft with head to lower back, legs elevated seat belt alarm activated and needs in reach.  Encouraged to sit up another 30 min prior to calling NT for return to bed to rest for PM session.    Patient has met 3 of 3 short term goals.  Patient progressing well with ambulation and tolerance to upright activities.  She met goal for ambulation, stairs and standing tolerance.  Patient continues to demonstrate the following deficits muscle weakness and decreased memory, decreased endurance and therefore will continue to benefit from skilled PT intervention to increase functional independence with mobility.  Patient progressing toward long term goals..  Continue plan of care.  PT Short Term Goals Week 1:  PT Short Term Goal 1 (Week 1): Patient to ambulate 42' with RW and mod A PT Short Term Goal 1 - Progress (Week 1): Met PT Short Term Goal 2 (Week 1): Patient to tolerate standing 3 minutes for functional task PT Short Term Goal 2 - Progress (Week 1): Met PT Short Term Goal 3 (Week 1):  Patient to initiate stair training. PT Short Term Goal 3 - Progress (Week 1): Met PT Short Term Goal 4 (Week 1): Patient to propel w/c 30' with S. Week 2:  PT Short Term Goal 1 (Week 2): Patient to ambulate 65' with RW and CGA PT Short Term Goal 2 (Week 2): Patient to demonstrate standing balance with CGA during functional task. PT Short Term Goal 3 (Week 2): Patient to negotiate 4 steps wtih rails and CGA  Skilled Therapeutic Interventions/Progress Updates:  Ambulation/gait training;Balance/vestibular training;Functional mobility training;Patient/family education;Therapeutic Exercise;Therapeutic Activities;Stair training;DME/adaptive equipment instruction;Wheelchair propulsion/positioning;UE/LE Strength taining/ROM   Therapy Documentation Precautions:  Precautions Precautions: Fall Precaution Comments: left knee buckles Restrictions Weight Bearing Restrictions: No Pain: Pain Assessment Pain Score: 7  Pain Location: Back Pain Orientation: Mid Pain Descriptors / Indicators: Discomfort;Throbbing Pain Intervention(s): Heat applied;Repositioned    Exercises: General Exercises - Upper Extremity Shoulder Flexion: Strengthening;Both;10 reps;Bar weights/barbell Bar Weights/Barbell (Shoulder Flexion): 1 lb Elbow Flexion: Strengthening;Both;10 reps;Bar weights/barbell Bar Weights/Barbell (Elbow Flexion): 1 lb General Exercises - Lower Extremity Long Arc Quad: Strengthening;Both;10 reps;Weights(3# cuff weights) Hip ABduction/ADduction: Strengthening;Both;10 reps;Seated(orange theraband) Hip Flexion/Marching: Strengthening;Both;10 reps;Standing Other Exercises Other Exercises: scapular retraction x 10 w/orange t-band x 10 Other Treatments:     Therapy/Group: Individual Therapy  Reginia Naas  Magda Kiel, PT 08/13/2018, 4:33 PM

## 2018-08-13 NOTE — Progress Notes (Signed)
Occupational Therapy Session Note  Patient Details  Name: Shelby Walker MRN: [id number] Date of Birth: 29-Dec-1933  Today's Date: 08/13/2018 OT Individual Time: 1330-1450 OT Individual Time Calculation (min): 80 min    Short Term Goals: Week 1:  OT Short Term Goal 1 (Week 1): Patient will completed UB and LB bathing and dressing at Supervision level using LRAD. OT Short Term Goal 2 (Week 1): Patient will complete toilet transfers at Supervision with DME if needed.  OT Short Term Goal 3 (Week 1): Patient will increase sitting tolerance by completing a task for 15 minutes or more in order to participate in ADL with less rest breaks. OT Short Term Goal 4 (Week 1): Patient will complete simulated walk-in shower transfer at Supervision level with Shower seat if needed. OT Short Term Goal 5 (Week 1): Patient will increase BUE strength and endurance to 4-/5 to increase participation in ADL tasks and functional transfers.   Skilled Therapeutic Interventions/Progress Updates:    Pt received supine with no c/o pain. Supine pt's BP 93/46. Pt transitioned to EOB with (S) and c/o slight dizziness, BP 96/52. Pt transferred to w/c with CGA and was transported to therapy gym. Discussion with pt re adaptations possible for arthritic hands if [facility] becomes difficult, but pt reporting no difficulties at the moment. Discussed shower transfers and pt's need for shower chair for energy conservation and fall prevention. Pt completed simulated shower transfer, using the backward stepping in method with demo provided and pt returning demo with CGA. Cueing and edu provided re fall prevention. Pt then completed dynamic standing balance with 4 in step, including grading activity by amount of UE support during stepping. Pt then completed 3x 10 resistive sit <> stands with a focus on eccentric muscle control. Pt completed 90 ft of functional mobility with CGA before returning to room to use bathroom. Pt transferred onto toilet with  close (S). Soiled LB clothing was removed and pt had diarrhea. Min A provided for hygiene for thoroughness. Pt returned to bed and c/o dizziness again, BP 102/46. Pt left supine with all needs met, bed alarm set.     Therapy Documentation Precautions:  Precautions Precautions: Fall Precaution Comments: left knee buckles Restrictions Weight Bearing Restrictions: No   Therapy/Group: Individual Therapy  Curtis Sites 08/13/2018, 7:23 AM

## 2018-08-13 NOTE — Progress Notes (Addendum)
Physical Therapy Session Note  Patient Details  Name: Shelby Walker MRN: [id number] Date of Birth: 1933-06-11  Today's Date: 08/13/2018 PT Individual Time: 0800-0859 PT Individual Time Calculation (min): 59 min   Short Term Goals: Week 1:  PT Short Term Goal 1 (Week 1): Patient to ambulate 45' with RW and mod A PT Short Term Goal 2 (Week 1): Patient to tolerate standing 3 minutes for functional task PT Short Term Goal 3 (Week 1): Patient to initiate stair training. PT Short Term Goal 4 (Week 1): Patient to propel w/c 51' with S.  Skilled Therapeutic Interventions/Progress Updates:  Pt in bed on arrival, awake, agreeable to therapy at this time. BP before session started 116/41.  Total assist to don bil TED hose in bed. Supine to sitting edge of bed supervision with HOB 30 degrees and rail used, increased time needed. Unsupported sitting at edge of bed: bil LE's heel/toe raises. Then pt's breakfast tray arrived. Pt able to self place dentures in after PTA rinsed them in sink, self wash face with provided wash cloth and set up own tray. Pt initially reported mild dizziness upon sitting that diminished with time at edge of bed. BP after above activities was 113/63. Pt able to assist with threading pants on bil LE's, min guard assist to stand bed to RW. Min guard assist for balance while pt assisted in pulling up pants. Then supervision to go 4 steps from bed to recliner. Min guard assist with cues to reach back for sitting down. Once in recliner pt able to attempt to bring foot up to pull off sock, however unable to fully achieve this. PTA doffed socks and donned shoes total assist. Pt performed several more sit<>stands over remainder of session, all min guard assist with cues for increased anterior weight shifting. Gait for 40 feet with RW with min guard assist. BP at this time rechecked with reading of 124/49. Pt then performed 2 more bouts of gait for 40 feet each with RW, min guard assist. Cues with  each gait for posture, step length and walker position. BP checked after last 2 gait reps with reading for 112/58. Seated in recliner pt performed with bil LE's: heel/toe raises, then long arc quads for 10 reps each with cues to slow down for improved muscle activation. Standing with RW support: alternating marching, then mini squats for 10 reps each, min guard for balance with cues on technique. Seated rest break between each exercise due to fatigue.   Pt left in recliner with feet up, belt alarm on and all needs in reach. Pt more alert and participatory today with therapy. BP at end of session 120/51.  Therapy Documentation Precautions:  Precautions Precautions: Fall Precaution Comments: left knee buckles Restrictions Weight Bearing Restrictions: No    Therapy/Group: Individual Therapy  Willow Ora, PTA 08/13/2018, 7:45 AM

## 2018-08-14 ENCOUNTER — Inpatient Hospital Stay (HOSPITAL_COMMUNITY): Payer: Medicare Other | Admitting: Occupational Therapy

## 2018-08-14 ENCOUNTER — Inpatient Hospital Stay (HOSPITAL_COMMUNITY): Payer: Medicare Other

## 2018-08-14 LAB — OCCULT BLOOD X 1 CARD TO LAB, STOOL: Fecal Occult Bld: POSITIVE — AB

## 2018-08-14 LAB — GLUCOSE, RANDOM: Glucose, Bld: 538 mg/dL (ref 70–99)

## 2018-08-14 LAB — GLUCOSE, CAPILLARY
Glucose-Capillary: 152 mg/dL — ABNORMAL HIGH (ref 70–99)
Glucose-Capillary: 192 mg/dL — ABNORMAL HIGH (ref 70–99)
Glucose-Capillary: 474 mg/dL — ABNORMAL HIGH (ref 70–99)
Glucose-Capillary: 444 mg/dL — ABNORMAL HIGH (ref 70–99)

## 2018-08-14 NOTE — Progress Notes (Signed)
Notified on call provider Marlowe Shores about patient Shelby Walker this shift after receiving insulin given by previous nurse;  No new order at this time. Per on call provider continue to monitor patient. Patient alert and oriented; remains asymptomatic; no s/s of hyperglycemia. Stool specimen collected  for occult stool and sent to lab. Will continue to monitor

## 2018-08-14 NOTE — Plan of Care (Signed)
  Problem: Consults Goal: Diabetes Guidelines if Diabetic/Glucose > 140 Description: If diabetic or lab glucose is > 140 mg/dl - Initiate Diabetes/Hyperglycemia Guidelines & Document Interventions  Outcome: Progressing   Problem: RH SAFETY Goal: RH STG ADHERE TO SAFETY PRECAUTIONS W/ASSISTANCE/DEVICE Description: STG Adhere to Safety Precautions With supervision Assistance/Device.  Outcome: Progressing

## 2018-08-14 NOTE — Progress Notes (Signed)
Occupational Therapy Weekly Progress Note  Patient Details  Name: Shelby Walker MRN: [id number] Date of Birth: 23-Jun-1933  Beginning of progress report period: August 07, 2018 End of progress report period: August 14, 2018  Today's Date: 08/14/2018 OT Individual Time: 0930-1030 OT Individual Time Calculation (min): 60 min    Patient has met 2 of 5 short term goals.  Pt has progressed well with her UE strength and activity tolerance.  She did not make 3 of the STGs that required S with transfers and mobility as she continues to need steadying A to min A.    Patient continues to demonstrate the following deficits: muscle weakness, decreased cardiorespiratoy endurance and decreased standing balance, decreased postural control and decreased balance strategies and therefore will continue to benefit from skilled OT intervention to enhance overall performance with BADL.  Patient progressing toward long term goals., but a few goals have been changed from mod I to S involving her mobility.  Plan of care revisions: LTGs of toilet transfers, showering and shower transfers downgraded from mod I to S as pt will need S with mobility at discharge. .  OT Short Term Goals Week 1:  OT Short Term Goal 1 (Week 1): Patient will completed UB and LB bathing and dressing at Supervision level using LRAD. OT Short Term Goal 1 - Progress (Week 1): Progressing toward goal OT Short Term Goal 2 (Week 1): Patient will complete toilet transfers at Supervision with DME if needed.  OT Short Term Goal 2 - Progress (Week 1): Progressing toward goal OT Short Term Goal 3 (Week 1): Patient will increase sitting tolerance by completing a task for 15 minutes or more in order to participate in ADL with less rest breaks. OT Short Term Goal 3 - Progress (Week 1): Met OT Short Term Goal 4 (Week 1): Patient will complete simulated walk-in shower transfer at Supervision level with Shower seat if needed. OT Short Term Goal 4 - Progress (Week  1): Progressing toward goal OT Short Term Goal 5 (Week 1): Patient will increase BUE strength and endurance to 4-/5 to increase participation in ADL tasks and functional transfers.  OT Short Term Goal 5 - Progress (Week 1): Met Week 2:  OT Short Term Goal 1 (Week 2): STGs = LTGs  Skilled Therapeutic Interventions/Progress Updates:      Pt seen for BADL retraining of toileting, bathing, and dressing with a focus on safe mobility, balance and endurance. Pt was able to stand from recliner with CGA and ambulate to bathroom with RW  with min A to toilet with CGA in standing. She then transferred to shower bench with min A. Pt bathed on bench with S.  She then sat at sink on chair to complete oral care.  Ambulated back to recliner.  Pt requested to wear incont. Brief vs her underwear and pad due to loose stool.  A pt with TEDs and non slip socks.  Pt participated very well and was slightly emotional at start of session. Discussed with pt the changes from a few LTGs from mod I to S.   Pt resting in recliner with alarm on and all needs met.   Therapy Documentation Precautions:  Precautions Precautions: Fall Precaution Comments: left knee buckles Restrictions Weight Bearing Restrictions: No    Vital Signs: Therapy Vitals BP: (!) 108/56 Patient Position (if appropriate): Sitting Pain: Pain Assessment Pain Score: 0-No pain   Therapy/Group: Individual Therapy  [place] 08/14/2018, 12:27 PM

## 2018-08-14 NOTE — Progress Notes (Signed)
Social Work Patient ID: Shelby Walker, female   DOB: May 11, 1933, 85 y.o.   MRN: [id number]   CSW met with pt and dtr (via telephone) to update them together on team conference discussion and targeted d/c date of 08-23-18.  Pt feels that is a long time away, but knows she is weak and needs to be on Rehab to get stronger.  Dtr was encouraging to pt to work hard and get through this time so that she can be where she wants to be when she goes home.  CSW will set up family education for dtr and maybe son for next week.  CSW offered pt support and encouragement showing her how far she had come since she came to [facility].  CSW will continue to follow and assist as needed.

## 2018-08-14 NOTE — Progress Notes (Signed)
[facility] PHYSICAL MEDICINE & REHABILITATION PROGRESS NOTE  Subjective/Complaints:   ROS: Denies CP, shortness of breath, nausea, vomiting, diarrhea.  Objective: Vital Signs: Blood pressure (!) 108/56, pulse 83, temperature 97.9 F (36.6 C), temperature source Oral, resp. rate 19, height 5\' 1"  (1.549 m), weight 57.1 kg, SpO2 97 %. No results found. Recent Labs    08/12/18 0536 08/13/18 0653  WBC 6.6 6.4  HGB 9.1* 9.6*  HCT 29.5* 31.1*  PLT 182 210   Recent Labs    08/12/18 0536 08/13/18 0653  NA 141 140  K 4.1 3.9  CL 98 100  CO2 33* 31  GLUCOSE 160* 170*  BUN 50* 44*  CREATININE 1.53* 1.29*  CALCIUM 8.0* 8.4*    Physical Exam: BP (!) 108/56   Pulse 83   Temp 97.9 F (36.6 C) (Oral)   Resp 19   Ht 5\' 1"  (1.549 m)   Wt 57.1 kg   SpO2 97%   BMI 23.79 kg/m  Constitutional: No distress . Vital signs reviewed. HENT: Normocephalic.  Atraumatic. Eyes: EOMI.  No discharge. Cardiovascular: No JVD. Respiratory: Normal effort.  GI: Non-distended. Musc: Joint deformities due to RA, stable Neurological: She is alert.  Alert Follows commands.   HOH Motor: Right upper extremity: 5/5 proximal distal,Right lower extremity: 4-4+/5 proximal distal,  Left upper extremity: 4-4+/5 proximal distal,  Left lower extremity: 4/5 proximal to distal  Skin: Skin is warm and dry.  Psychiatric: Her affect is blunt.    Assessment/Plan: 1. Functional deficits secondary to debility which require 3+ hours per day of interdisciplinary therapy in a comprehensive inpatient rehab setting.  Physiatrist is providing close team supervision and 24 hour management of active medical problems listed below.  Physiatrist and rehab team continue to assess barriers to discharge/monitor patient progress toward functional and medical goals  Care Tool:  Bathing    Body parts bathed by patient: Right arm, Left arm, Chest, Abdomen, Right upper leg, Left upper leg, Face   Body parts bathed by  helper: Buttocks, Front perineal area, Right lower leg, Left lower leg     Bathing assist Assist Level: Minimal Assistance - Patient > 75%     Upper Body Dressing/Undressing Upper body dressing   What is the patient wearing?: Pull over shirt    Upper body assist Assist Level: Set up assist    Lower Body Dressing/Undressing Lower body dressing      What is the patient wearing?: Underwear/pull up, Pants     Lower body assist Assist for lower body dressing: Minimal Assistance - Patient > 75%     Toileting Toileting    Toileting assist Assist for toileting: Minimal Assistance - Patient > 75%     Transfers Chair/bed transfer  Transfers assist     Chair/bed transfer assist level: Minimal Assistance - Patient > 75%     Locomotion Ambulation   Ambulation assist      Assist level: Minimal Assistance - Patient > 75% Assistive device: Walker-rolling Max distance: 30'   Walk 10 feet activity   Assist  Walk 10 feet activity did not occur: Safety/medical concerns(Per PT)  Assist level: Minimal Assistance - Patient > 75% Assistive device: Walker-rolling   Walk 50 feet activity   Assist Walk 50 feet with 2 turns activity did not occur: Safety/medical concerns(Per PT)         Walk 150 feet activity   Assist Walk 150 feet activity did not occur: Safety/medical concerns(Per PT)  Walk 10 feet on uneven surface  activity   Assist Walk 10 feet on uneven surfaces activity did not occur: Safety/medical concerns(Per PT)         Wheelchair     Assist   Type of Wheelchair: Manual    Wheelchair assist level: Supervision/Verbal cueing Max wheelchair distance: 11'    Wheelchair 50 feet with 2 turns activity    Assist    Wheelchair 50 feet with 2 turns activity did not occur: Safety/medical concerns(Per PT)   Assist Level: Supervision/Verbal cueing   Wheelchair 150 feet activity     Assist Wheelchair 150 feet activity did not  occur: Safety/medical concerns(Per PT)          Medical Problem List and Plan: 1.  Debility secondary to septic shock due to C. difficile colitis (completed course of vancomycin).  Contact precautions  .2.  Antithrombotics: -DVT/anticoagulation: Eliquis             -antiplatelet therapy: N/A 3. Pain Management: Oxycodone as needed 4. Mood: Provide emotional support             -antipsychotic agents: N/A 5. Neuropsych: This patient is ?  Fully capable of making decisions on her own behalf. 6. Skin/Wound Care: Routine skin checks 7. Fluids/Electrolytes/Nutrition: Routine in and outs  Megace ordered on 6/9 due to poor appetite.  8.  New onset PAF.  Continue Eliquis.    Lopressor 12.5 mg twice daily, changed to Coreg on 6/8 due to hypotension, DC'd on 6/9 due to hypotension/bradycardia, however normal HR on ECG.  Will cont to monitor.             Bradycardia improved  ECG on 5/29 reviewed-showing A. fib, repeat ECG reviewed-normal Vitals:   08/14/18 0634 08/14/18 1020  BP: 95/61 (!) 108/56  Pulse: 83   Resp: 19   Temp: 97.9 F (36.6 C)   SpO2: 97%    9.    Steroid-induced hyperglycemia on diabetes mellitus.  Hemoglobin A1c 8.8.  Sliding scale insulin.  NovoLog 3 3 times daily started on 6/8   CBG (last 3)  Recent Labs    08/13/18 1723 08/13/18 2109 08/14/18 0633  GLUCAP 145* 179* 152*  Improved this morning will continue to monitor before any other medication changes  Monitor with increased mobility 10.  Rheumatoid arthritis.  Patient on Enbrel 50 mg injection weekly and can be resumed as outpatient.  Continue chronic prednisone 5 mg daily 11.  AKI vs CKD.    Appreciate nephro recs, adjusting diuretics             Creatinine 1.53 on 6/9, slowly improving, 1.29 on 6/10  Encourage fluids  Continue to monitor 12.  Hypoalbuminemia  Supplement initiated on 6/4 13.  Leukocytosis: Resolved-likely steroid-induced +/- C. difficile  WBCs 6.6 on 6/9, 6.4 on  6/10  Afebrile  Continue to monitor 14. Acute blood loss anemia  Hemoglobin 9.1 on 6/9, 9.6 on 6/10  Occult pending  Continue to monitor  15.  Lumbar back pain  Secondary degenerative disc disease as well as facet arthropathy of lumbar spine  L-spine x-ray reviewed suggesting degenerative changes  Controlled on 6/8 16.  Hypokalemia  Potassium 4.1 on 6/9, 3.9 on 6/10 17.  Hypophosphatemia  Supplemented x1 dose on 6/8 18.  Shortness of breath: Resolved  Chest x-ray from 5/31 reviewed, showing bilateral small pleural effusions and atelectasis  Follow-up x-ray reviewed, stable  LOS: 8 days A FACE TO FACE EVALUATION WAS PERFORMED  Luanna Salk  Kirsten Spearing 08/14/2018, 10:39 AM

## 2018-08-14 NOTE — Progress Notes (Signed)
Occupational Therapy Session Note  Patient Details  Name: Shelby Walker MRN: [id number] Date of Birth: [birth year]  Today's Date: 08/14/2018 OT Individual Time:  -       Short Term Goals: Week 1:  OT Short Term Goal 1 (Week 1): Patient will completed UB and LB bathing and dressing at Supervision level using LRAD. OT Short Term Goal 1 - Progress (Week 1): Progressing toward goal OT Short Term Goal 2 (Week 1): Patient will complete toilet transfers at Supervision with DME if needed.  OT Short Term Goal 2 - Progress (Week 1): Progressing toward goal OT Short Term Goal 3 (Week 1): Patient will increase sitting tolerance by completing a task for 15 minutes or more in order to participate in ADL with less rest breaks. OT Short Term Goal 3 - Progress (Week 1): Met OT Short Term Goal 4 (Week 1): Patient will complete simulated walk-in shower transfer at Supervision level with Shower seat if needed. OT Short Term Goal 4 - Progress (Week 1): Progressing toward goal OT Short Term Goal 5 (Week 1): Patient will increase BUE strength and endurance to 4-/5 to increase participation in ADL tasks and functional transfers.  OT Short Term Goal 5 - Progress (Week 1): Met  Skilled Therapeutic Interventions/Progress Updates:    Pt in bed upon therapy arrival and agreeable to participate in OT session. Worked on transitioning from supine to sit with supervision and use of bed railings. Min assist was needed to donn shoes while seated on EOB. Sit to stand from bed was completed at Supervision level with VC for form and technique using RW. Pt completed static standing activity to focus on standing tolerance. Pt was able to stand for 5 minute before requesting to stand. Request for the bathroom was made and patient was able to ambulate to bathroom with RW and complete the toilet transfer with SBA. Brief was changed due to accident and patient completed hygiene with set-up. Due to low level of toilet, patient required min  assist to complete sit to stand from toilet using grab bar and RW. Pt stood to wash her hands at the sink using the RW. Pt completed BUE strengthening with 2# dowel while seated on EOB with VC for form and technique and rest breaks taken when needed. Patient worked on Dance movement psychotherapist during sit to stands from bed using RW. 5 trials completed. VC for form and technique. Pt improved with each trial showing decreased difficulty. No physical assistance to complete. Pt then transitioned from sitting to supine in bed. Bed alarm was placed on and call light was within reach upon therapy completion.   Therapy Documentation Precautions:  Precautions Precautions: Fall Precaution Comments: left knee buckles Restrictions Weight Bearing Restrictions: No Pain: Pain Assessment Pain Scale: 0-10 Pain Score: 0-No pain Faces Pain Scale: Hurts a little bit Pain Type: Chronic pain Pain Location: Back Pain Orientation: Mid Pain Descriptors / Indicators: Discomfort Pain Intervention(s): Ambulation/increased activity  Therapy/Group: Individual Therapy   Ailene Ravel, OTR/L,CBIS  [phone]  08/14/2018, 4:45 PM

## 2018-08-14 NOTE — Patient Care Conference (Signed)
Inpatient RehabilitationTeam Conference and Plan of Care Update Date: 08/13/2018   Time: 9:45 AM    Patient Name: Shelby Walker      Medical Record Number: [id number]  Date of Birth: March 19, 1933 Sex: Female         Room/Bed: 5N12C/5N12C-01 Payor Info: Payor: MEDICARE / Plan: MEDICARE PART A AND B / Product Type: *No Product type* /    Admitting Diagnosis: NT Team  Debility, malaise; 12-14days  Admit Date/Time:  08/06/2018 12:18 PM Admission Comments: No comment available   Primary Diagnosis:  <principal problem not specified> Principal Problem: <principal problem not specified>  Patient Active Problem List   Diagnosis Date Noted  . Bradycardia   . Hypotension due to drugs   . Shortness of breath   . Hypophosphatemia   . Chest discomfort   . Pain   . Hypokalemia   . Steroid-induced hyperglycemia   . Acute blood loss anemia   . Leukocytosis   . Hypoalbuminemia due to protein-calorie malnutrition ([place])   . Debility 08/06/2018  . AKI (acute kidney injury) ([place])   . Diabetes mellitus type 2 in nonobese ([facility])   . New onset atrial fibrillation ([place])   . Shock ([place]) 07/30/2018  . Diverticulitis 07/24/2018  . Colitis presumed infectious 07/24/2018  . Colitis 07/24/2018  . Clostridium difficile colitis 07/24/2018  . Mixed hyperlipidemia 09/29/2016  . Stable angina ([place]) 09/29/2016  . Claudication in peripheral vascular disease ([place]) 08/09/2016  . Preoperative clearance 05/21/2012  . Precordial chest pain   . Palpitations   . Diabetes mellitus ([place])   . GERD (gastroesophageal reflux disease)   . Ejection fraction   . Carotid artery disease ([place])   . Mitral regurgitation 04/21/2009  . PSORIASIS 02/09/2009  . Rheumatoid arthritis ([place]) 02/09/2009    Expected Discharge Date: Expected Discharge Date: 08/23/18  Team Members Present: Physician leading conference: Dr. Alysia Penna Social Worker Present: Alfonse Alpers, LCSW Nurse Present: Other (comment)(Christal Richardson Landry,  RN) PT Present: Other (comment)(Cyndi Rick Duff, PT) OT Present: Meriel Pica, OT SLP Present: Weston Anna, SLP PPS Coordinator present : Gunnar Fusi     Current Status/Progress Goal Weekly Team Focus  Medical   Poor endurance, stools improving  maintain stability, avoid falls  initiating therapy   Bowel/Bladder   Continent of Blader/Bowel, occassional incontinent episode stool x1, LBM 08/12/18     Remain on Isolation -C-Diff, Assess QS and prn, Hemoccult Negative x1 on 08/12/18, Need 1 more Hemoccult per MD order   Swallow/Nutrition/ Hydration             ADL's   min A with LB self care, toileting, toilet transfers; mod A to stand from low surfaces, can ambulate to bathroom with RW  set at Mod I, may need to adjust to S  ADL training, functional mobility, pt education, activity tolerance, general strengthening   Mobility   min A about 20' with RW wearing THT, min A standing balance for hand washing.  BP stabilizing, S bed mobility, min A 3 steps with rails  S overall, CGA stairs  upright tolerance, gait, balance, strength   Communication             Safety/Cognition/ Behavioral Observations            Pain   medicated with PRN for chronic mid/lower back pain ,   < 3   Assess QS/PRN and medicate with PRN , with f/u evslustion   Skin   LF/RT skin tear LE;s improved, Ecchymotic areas to  Bilateral Arms/Abdomen resolving  Maintain skin Integumentary  Assess QS/PRN address new areas of concerns and notify MD/PA,     Rehab Goals Patient on target to meet rehab goals: Yes Rehab Goals Revised: OT goals were downgraded to supervision. *See Care Plan and progress notes for long and short-term goals.     Barriers to Discharge  Current Status/Progress Possible Resolutions Date Resolved   Physician    Medical stability     initial evals in progress  Cont rehab program      Nursing                  PT                    OT                  SLP                SW                 Discharge Planning/Teaching Needs:  Pt to return to her home with her husband, dtr, and son to provide 24/7 supervision.  Dtr (and maybe son) will come for family education next week closer to d/c.   Team Discussion:  Pt with decreased appetite, started on Megace.  Pt's HTN is controlled and she has som acute blood loss anemia.  Pt needs one more stool sample collected to show c-diff status.  Pt doesn't like the ACE wrap.  She is min A to bathroom and stood at the sink and did stairs with min A.  Pt is just deconditioned and recommended timed toileting so pt doesn't have to try to toilet so quickly.  Pt is min A overall with OT.  Goals were downgraded.  She can do things for herself, she's just very weak.  Pt with supervision level goals.    Revisions to Treatment Plan:  none    Continued Need for Acute Rehabilitation Level of Care: The patient requires daily medical management by a physician with specialized training in physical medicine and rehabilitation for the following conditions: Daily direction of a multidisciplinary physical rehabilitation program to ensure safe treatment while eliciting the highest outcome that is of practical value to the patient.: Yes Daily medical management of patient stability for increased activity during participation in an intensive rehabilitation regime.: Yes Daily analysis of laboratory values and/or radiology reports with any subsequent need for medication adjustment of medical intervention for : Other   I attest that I was present, lead the team conference, and concur with the assessment and plan of the team.Team conference was held via web/ teleconference due to [place] - 19.   [place] Oran, Silvestre Mesi 08/14/2018, 11:24 AM

## 2018-08-14 NOTE — Progress Notes (Signed)
Physical Therapy Session Note  Patient Details  Name: TEESHA OHM MRN: [id number] Date of Birth: 12-04-1933  Today's Date: 08/14/2018 PT Individual Time: 1100-1200 PT Individual Time Calculation (min): 60 min   Short Term Goals: Week 2:  PT Short Term Goal 1 (Week 2): Patient to ambulate 77' with RW and CGA PT Short Term Goal 2 (Week 2): Patient to demonstrate standing balance with CGA during functional task. PT Short Term Goal 3 (Week 2): Patient to negotiate 4 steps wtih rails and CGA  Skilled Therapeutic Interventions/Progress Updates:    Patient in recliner in room.  Assist to don shoes.  Sit to stand with RW min a and ambulated x 15' with RW and min A.  Seated in w/c propelled about 64' with S.  Negotiated 5 steps with 2 rails and CGA to min A x 2 trials.  Noted decreased L hip strength with stepping down from 6" step with buckling.  Patient ambulated x 57' with RW and min A, no c/o dizziness, just general weakness.  Patient performed standing hip abduction, minisquats and heel raises standing holding onto stair railings.  Performed sit<>stand x 5 pushing with UE's from mat, but no walker in front.  Standing side steps at rail with cues for foot clearance.  Tandem gait forwards and backwards at rail with 1 UE support and min A.    Seated EOM for core work with soccer ball moving up and down, hip to hip, hip to shoulder, and in and out with cues for posture and core activation.  Discussed progress and pt reports not feeling as fatigued after session today.  Ambulated back to room x about 46' with RW and minguard A.  Left seated in recliner with legs elevated and alarm belt activated with call bell in reach.   Therapy Documentation Precautions:  Precautions Precautions: Fall Precaution Comments: left knee buckles Restrictions Weight Bearing Restrictions: No Pain: Pain Assessment Pain Score: 0-No pain Faces Pain Scale: Hurts a little bit Pain Type: Chronic pain Pain Location:  Back Pain Orientation: Mid Pain Descriptors / Indicators: Discomfort Pain Intervention(s): Ambulation/increased activity    Therapy/Group: Individual Therapy  Reginia Naas  [place], PT 08/14/2018, 4:04 PM

## 2018-08-14 NOTE — Progress Notes (Signed)
Lab called with a critical CBG of 538; called Dan Angiulli back and was told to give 9 units of insulin from sliding scale and 3 units for meal coverage-given. Patient is A&O and having a facetime conversation with several family members at this time.

## 2018-08-15 ENCOUNTER — Inpatient Hospital Stay (HOSPITAL_COMMUNITY): Payer: Medicare Other | Admitting: Occupational Therapy

## 2018-08-15 ENCOUNTER — Inpatient Hospital Stay (HOSPITAL_COMMUNITY): Payer: Medicare Other

## 2018-08-15 ENCOUNTER — Inpatient Hospital Stay (HOSPITAL_COMMUNITY): Payer: Medicare Other | Admitting: Physical Therapy

## 2018-08-15 LAB — GLUCOSE, CAPILLARY
Glucose-Capillary: 321 mg/dL — ABNORMAL HIGH (ref 70–99)
Glucose-Capillary: 194 mg/dL — ABNORMAL HIGH (ref 70–99)
Glucose-Capillary: 188 mg/dL — ABNORMAL HIGH (ref 70–99)
Glucose-Capillary: 208 mg/dL — ABNORMAL HIGH (ref 70–99)

## 2018-08-15 MED ORDER — INSULIN NPH (HUMAN) (ISOPHANE) 100 UNIT/ML ~~LOC~~ SUSP
5.0000 [IU] | Freq: Two times a day (BID) | SUBCUTANEOUS | Status: DC
  Administered 2018-08-15 – 2018-08-23 (×15): 5 [IU] via SUBCUTANEOUS
  Filled 2018-08-15 (×2): qty 10

## 2018-08-15 NOTE — Progress Notes (Signed)
Occupational Therapy Session Note  Patient Details  Name: Shelby Walker MRN: [id number] Date of Birth: Jul 19, 1933  Today's Date: 08/15/2018 OT Individual Time: 2376-2831 OT Individual Time Calculation (min): 26 min    Short Term Goals: Week 2:  OT Short Term Goal 1 (Week 2): STGs = LTGs  Skilled Therapeutic Interventions/Progress Updates:   Session focused on toileting tasks. Pt received sitting in recliner with no c/o pain. Teds were donned for BP support total A. Pt able to don shoes with cueing for positioning and able to fasten laces. Pt used RW to complete ambulatory transfer into bathroom with close (S). Pt completed clothing management with (S). Pt required min A for peri hygiene for thoroughness following BM. From low toilet pt required increased assistance to stand with L knee buckling. Pt washed hands at sink in standing with (S). Pt returned to EOB and was assisted back to supine. Pt c/o pain in her bottom from laying and a pillow was used to offset pressure. Pt left supine with all needs met, bed alarm set.   Therapy Documentation Precautions:  Precautions Precautions: Fall Precaution Comments: left knee buckles Restrictions Weight Bearing Restrictions: No   Therapy/Group: Individual Therapy  Curtis Sites 08/15/2018, 5:08 PM

## 2018-08-15 NOTE — Progress Notes (Signed)
Physical Therapy Session Note  Patient Details  Name: Shelby Walker MRN: [id number] Date of Birth: December 06, 1933  Today's Date: 08/15/2018 PT Individual Time: 0800-0914 PT Individual Time Calculation (min): 74 min   Short Term Goals: Week 2:  PT Short Term Goal 1 (Week 2): Patient to ambulate 92' with RW and CGA PT Short Term Goal 2 (Week 2): Patient to demonstrate standing balance with CGA during functional task. PT Short Term Goal 3 (Week 2): Patient to negotiate 4 steps wtih rails and CGA  Skilled Therapeutic Interventions/Progress Updates:  Pt in bathroom with NT on arrival. Agreeable to therapy. Does report not sleeping well last night first due to pants and bra too tight. Nursing removed them and then her big toe started "hurting something awful". Took 2 hours to get to sleep once she had pain med's.  Pt independent with peri care on toilet. Min guard assist to stand to RW and pull up depends. Supervision for gait to sink. At sink with no UE support/min guard assist for balance pt washed hands, washed face, brushed her hair and brushed her mouth (dentures not in as yet) with no balance loss. Min guard assist for gait to recliner. Seated in recliner pt was able to don pants on both legs, stand with min guard assist to pull then up, then change her shirt independently. gait for 150 feet with RW, min guard assist. Cues on posture, increased step length and increased base of support. NuStep level 3 with UE/LE for 8 minutes with goal >/=45 steps per minute. Pt with need for restroom, therefore ambulated the 150 feet back to room at min guard level and supervision to sit to toilet.  Pt able to self manage clothing. Pt performed peri care. Mod assist to stand to RW with cues to use rail. In standing pt able to pull depends and pants back up. Min guard assist with RW to sink to wash hands, then back to recliner. After a brief rest break the pt stood to the RW with min guard assist. Standing with RW support:  alternating marching for 10 reps each side, then mini squats x 10 reps. Min guard to sit back down and pt able to self scoot back into chair. Pt left with breakfast tray in front of her to finish eating.   Pt left with feet up, belt alarm on and needs in reach. Pt able to fully participate today despite reports of not sleeping well last night.   Therapy Documentation Precautions:  Precautions Precautions: Fall Precaution Comments: left knee buckles Restrictions Weight Bearing Restrictions: No    Therapy/Group: Individual Therapy  Willow Ora, PTA 08/15/2018, 10:18 AM

## 2018-08-15 NOTE — Progress Notes (Signed)
Occupational Therapy Session Note  Patient Details  Name: Shelby Walker MRN: [id number] Date of Birth: 1934-02-12  Today's Date: 08/15/2018 OT Individual Time: 1010-1105 OT Individual Time Calculation (min): 55 min    Short Term Goals: Week 2:  OT Short Term Goal 1 (Week 2): STGs = LTGs  Skilled Therapeutic Interventions/Progress Updates:    Treatment session with focus on activity tolerance, sit > stand, and UE strengthening. Pt received upright in recliner reporting having not slept well over night.  Pt agreeable to therapy.  Engaged in sit > stand from recliner with Min assist for anterior weight shift, but no lifting.  Pt demonstrating good placement of UE during sit > stand.  During sit > stand training, pt reports having been incontinent of bowel.  Pt ambulated to toilet with RW and CGA.  Pt completed toileting and hygiene with CGA.  Pt washed hands standing at sink and then requested to sit due to fatigue and BLE weakness.  Pt required extensive rest break before willing to attempt additional therapy.  Pt requested to not complete sit > stand due to loose bowels with each stand, therefore engaged in BUE strengthening.  Utilized 3# dowel and completed 2 sets of 10 each, lateral rows, bicep curls, and chest presses. Pt transferred back to bed close supervision stand pivot transfer with RW and left semi-recline with all needs in reach.  Pt asking questions about continued loose bowels and decreased endurance.  Therapy Documentation Precautions:  Precautions Precautions: Fall Precaution Comments: left knee buckles Restrictions Weight Bearing Restrictions: No General:   Vital Signs: Therapy Vitals Pulse Rate: 100 BP: 115/82 Patient Position (if appropriate): Sitting Pain: Pain Assessment Pain Scale: 0-10 Pain Score: 0-No pain   Therapy/Group: Individual Therapy  Simonne Come 08/15/2018, 12:27 PM

## 2018-08-15 NOTE — Progress Notes (Signed)
[facility] PHYSICAL MEDICINE & REHABILITATION PROGRESS NOTE  Subjective/Complaints:   Had a fair night. Right first toe was sore from activity the day before. Feels better this morning  ROS: Patient denies fever, rash, sore throat, blurred vision, nausea, vomiting, diarrhea, cough, shortness of breath or chest pain, joint or back pain, headache, or mood change.    Objective: Vital Signs: Blood pressure 115/82, pulse 100, temperature 98.6 F (37 C), temperature source Oral, resp. rate 18, height 5\' 1"  (1.549 m), weight 59.2 kg, SpO2 97 %. No results found. Recent Labs    08/13/18 0653  WBC 6.4  HGB 9.6*  HCT 31.1*  PLT 210   Recent Labs    08/13/18 0653 08/14/18 1930  NA 140  --   K 3.9  --   CL 100  --   CO2 31  --   GLUCOSE 170* 538*  BUN 44*  --   CREATININE 1.29*  --   CALCIUM 8.4*  --     Physical Exam: BP 115/82 (BP Location: Left Arm)   Pulse 100   Temp 98.6 F (37 C) (Oral)   Resp 18   Ht 5\' 1"  (1.549 m)   Wt 59.2 kg   SpO2 97%   BMI 24.66 kg/m  Constitutional: No distress . Vital signs reviewed. HEENT: EOMI, oral membranes moist Neck: supple Cardiovascular: RRR without murmur. No JVD    Respiratory: CTA Bilaterally without wheezes or rales. Normal effort    GI: BS +, non-tender, non-distended  Musc: Joint deformities due to RA, valgus deform right great toe Neurological: She is alert.  Alert Follows commands.   HOH Motor: Right upper extremity: 5/5 proximal distal,Right lower extremity: 4-4+/5 proximal distal,  Left upper extremity: 4-4+/5 proximal distal,  Left lower extremity: 4/5 proximal to distal  Skin: Skin is warm and dry.  Psychiatric: pleasant   Assessment/Plan: 1. Functional deficits secondary to debility which require 3+ hours per day of interdisciplinary therapy in a comprehensive inpatient rehab setting.  Physiatrist is providing close team supervision and 24 hour management of active medical problems listed  below.  Physiatrist and rehab team continue to assess barriers to discharge/monitor patient progress toward functional and medical goals  Care Tool:  Bathing    Body parts bathed by patient: Right arm, Left arm, Chest, Abdomen, Right upper leg, Left upper leg, Face, Front perineal area, Buttocks, Right lower leg, Left lower leg   Body parts bathed by helper: Buttocks, Front perineal area, Right lower leg, Left lower leg     Bathing assist Assist Level: Supervision/Verbal cueing     Upper Body Dressing/Undressing Upper body dressing   What is the patient wearing?: Bra, Pull over shirt    Upper body assist Assist Level: Minimal Assistance - Patient > 75%(A to fasten bra)    Lower Body Dressing/Undressing Lower body dressing      What is the patient wearing?: Incontinence brief, Pants     Lower body assist Assist for lower body dressing: Supervision/Verbal cueing     Toileting Toileting    Toileting assist Assist for toileting: Contact Guard/Touching assist     Transfers Chair/bed transfer  Transfers assist     Chair/bed transfer assist level: Minimal Assistance - Patient > 75%     Locomotion Ambulation   Ambulation assist      Assist level: Contact Guard/Touching assist Assistive device: Walker-rolling Max distance: 150   Walk 10 feet activity   Assist  Walk 10 feet activity did not occur:  Safety/medical concerns(Per PT)  Assist level: Contact Guard/Touching assist Assistive device: Walker-rolling   Walk 50 feet activity   Assist Walk 50 feet with 2 turns activity did not occur: Safety/medical concerns(Per PT)  Assist level: Contact Guard/Touching assist Assistive device: Walker-rolling    Walk 150 feet activity   Assist Walk 150 feet activity did not occur: Safety/medical concerns(Per PT)  Assist level: Contact Guard/Touching assist Assistive device: Walker-rolling    Walk 10 feet on uneven surface  activity   Assist Walk 10  feet on uneven surfaces activity did not occur: Safety/medical concerns(Per PT)   Assist level: Minimal Assistance - Patient > 75% Assistive device: Aeronautical engineer Will patient use wheelchair at discharge?: No Type of Wheelchair: Manual    Wheelchair assist level: Supervision/Verbal cueing Max wheelchair distance: 50'    Wheelchair 50 feet with 2 turns activity    Assist    Wheelchair 50 feet with 2 turns activity did not occur: Safety/medical concerns(Per PT)   Assist Level: Supervision/Verbal cueing   Wheelchair 150 feet activity     Assist Wheelchair 150 feet activity did not occur: Safety/medical concerns(Per PT)   Assist Level: Supervision/Verbal cueing      Medical Problem List and Plan: 1.  Debility secondary to septic shock due to C. difficile colitis (completed course of vancomycin).  Contact precautions 2.  Antithrombotics: -DVT/anticoagulation: Eliquis             -antiplatelet therapy: N/A 3. Pain Management: Oxycodone as needed  -appropriate shoe wear to accommodate right great toe 4. Mood: Provide emotional support             -antipsychotic agents: N/A 5. Neuropsych: This patient is ?  Fully capable of making decisions on her own behalf. 6. Skin/Wound Care: Routine skin checks 7. Fluids/Electrolytes/Nutrition: Routine in and outs  Megace ordered on 6/9 due to poor appetite---appetite appears to be picking up 8.  New onset PAF.  Continue Eliquis.    Lopressor 12.5 mg twice daily, changed to Coreg on 6/8 due to hypotension, DC'd on 6/9 due to hypotension/bradycardia             Bradycardia improved, avoid TACHYcardia    Vitals:   08/15/18 0449 08/15/18 1038  BP: (!) 134/46 115/82  Pulse: 81 100  Resp: 18   Temp: 98.6 F (37 C)   SpO2: 97%    9.    Steroid-induced hyperglycemia on diabetes mellitus.  Hemoglobin A1c 8.8.  Sliding scale insulin.  NovoLog 3 3 times daily started on 6/8   CBG (last 3)  Recent  Labs    08/14/18 1844 08/14/18 2132 08/15/18 0646  GLUCAP 474* 444* 321*    -cbgs extremely labile  -on 10u NPH at bedtime---will initiate 5u BID and adjust regimen accordingly  Monitor with increased mobility 10.  Rheumatoid arthritis.  Patient on Enbrel 50 mg injection weekly and can be resumed as outpatient.  Continue chronic prednisone 5 mg daily 11.  AKI vs CKD.    Appreciate nephro recs, adjusting diuretics             Creatinine 1.53 on 6/9, slowly improving, 1.29 on 6/10  Encourage fluids  Continue to monitor--recheck monday 12.  Hypoalbuminemia  Supplement initiated on 6/4 13.  Leukocytosis: Resolved-likely steroid-induced +/- C. difficile  WBCs 6.6 on 6/9, 6.4 on 6/10  Afebrile  Continue to monitor-check monday 14. Acute blood loss anemia  Hemoglobin 9.1 on 6/9, 9.6 on 6/10  1 of 2 stools OB+  Continue to monitor --check monday 15.  Lumbar back pain  Secondary degenerative disc disease as well as facet arthropathy of lumbar spine  L-spine x-ray reviewed suggesting degenerative changes  Controlled on 6/8 16.  Hypokalemia  Potassium 4.1 on 6/9, 3.9 on 6/10 17.  Hypophosphatemia  Supplemented x1 dose on 6/8 18.  Shortness of breath: Resolved  Chest x-ray from 5/31 reviewed, showing bilateral small pleural effusions and atelectasis  Follow-up x-ray stable  LOS: 9 days A FACE TO FACE EVALUATION WAS PERFORMED  Meredith Staggers 08/15/2018, 12:01 PM

## 2018-08-15 NOTE — Progress Notes (Signed)
Occupational Therapy Session Note  Patient Details  Name: Shelby Walker MRN: [id number] Date of Birth: Aug 13, 1933  Today's Date: 08/15/2018 OT Individual Time: 1250-1348 OT Individual Time Calculation (min): 58 min    Short Term Goals: Week 2:  OT Short Term Goal 1 (Week 2): STGs = LTGs  Skilled Therapeutic Interventions/Progress Updates:    Patient supine in bed, states that she has had a bowel accident in her brief.  Supine to SSP with CS.  Functional ambulation in room and SPT with RW to/from bed, recliner and toilet with CS/CGA.  toileting with min A to manage soiled brief otherwise CGA for pants up/down and hygiene.  She is able to stand at sink with CS for washing hands and basic grooming.   Returned to the bathroom later in session due to another bowel accident - rectum sore, applied cream.  Patient completed standing mobility activities in room with CS and RW.  She is able to set up meal for lunch mod I.  She is sitting in recliner at close of session with chair alarm set and call bell/tray table in reach.  Therapy Documentation Precautions:  Precautions Precautions: Fall Precaution Comments: left knee buckles Restrictions Weight Bearing Restrictions: No General:   Vital Signs: Therapy Vitals Pulse Rate: 98 Resp: 16 BP: (!) 97/50 Patient Position (if appropriate): Sitting Oxygen Therapy SpO2: 100 % O2 Device: Room Air Pain: Pain Assessment Pain Scale: 0-10 Pain Score: 0-No pain   Therapy/Group: Individual Therapy  Carlos Levering 08/15/2018, 2:34 PM

## 2018-08-15 NOTE — Plan of Care (Signed)
  Problem: RH SKIN INTEGRITY Goal: RH STG SKIN FREE OF INFECTION/BREAKDOWN Description: No new breakdown with min assist    Outcome: Progressing   Problem: RH SAFETY Goal: RH STG ADHERE TO SAFETY PRECAUTIONS W/ASSISTANCE/DEVICE Description: STG Adhere to Safety Precautions With supervision Assistance/Device.  Outcome: Progressing   Problem: RH PAIN MANAGEMENT Goal: RH STG PAIN MANAGED AT OR BELOW PT'S PAIN GOAL Description: <3 out of 10.   Outcome: Progressing

## 2018-08-16 DIAGNOSIS — A0472 Enterocolitis due to Clostridium difficile, not specified as recurrent: Secondary | ICD-10-CM

## 2018-08-16 LAB — GLUCOSE, CAPILLARY
Glucose-Capillary: 170 mg/dL — ABNORMAL HIGH (ref 70–99)
Glucose-Capillary: 211 mg/dL — ABNORMAL HIGH (ref 70–99)
Glucose-Capillary: 342 mg/dL — ABNORMAL HIGH (ref 70–99)
Glucose-Capillary: 193 mg/dL — ABNORMAL HIGH (ref 70–99)

## 2018-08-16 MED ORDER — VANCOMYCIN 50 MG/ML ORAL SOLUTION
125.0000 mg | Freq: Four times a day (QID) | ORAL | Status: DC
  Administered 2018-08-16 – 2018-08-20 (×15): 125 mg via ORAL
  Filled 2018-08-16 (×17): qty 2.5

## 2018-08-16 NOTE — Progress Notes (Signed)
Patient having increased loose stools with mucus and red blood blood over night into today (a total of 7 loose stools) . Patient also c/o abdominal cramping. Nurse called on call MD (Dr. Leanne Chang). Orders for Vancomycin 125 mg PO  QID were given.  Shelby Walker

## 2018-08-16 NOTE — Progress Notes (Signed)
[facility] PHYSICAL MEDICINE & REHABILITATION PROGRESS NOTE  Subjective/Complaints:   Patient had an okay night.  The last few hours have been difficult.  She states has had multiple loose stools.   Objective: Physical Exam: BP (!) 129/54 (BP Location: Left Arm)   Pulse 98   Temp 99 F (37.2 C) (Oral)   Resp 16   Ht 5\' 1"  (1.549 m)   Wt 60.1 kg   SpO2 99%   BMI 25.03 kg/m  Elderly female in no acute distress.  HEENT exam atraumatic, normocephalic.  Oral membranes are moist. Neck is supple Chest clear to auscultation Cardiac exam S1 and S2 are regular Abdominal exam thin, active bowel sounds, soft Extremities without edema.   Assessment/Plan: 1. Functional deficits secondary to debility   Medical Problem List and Plan: 1.  Debility secondary to septic shock due to C. difficile colitis (completed course of vancomycin).  Contact precautions 2.  Antithrombotics: -DVT/anticoagulation: Eliquis             -antiplatelet therapy: N/A 3. Pain Management: Currently no pain. 4. Mood: Provide emotional support             -antipsychotic agents: N/A 5. Neuropsych: This patient is ?  Fully capable of making decisions on her own behalf. 6. Skin/Wound Care: Routine skin checks 7. Fluids/Electrolytes/Nutrition: Routine in and outs  Megace ordered on 6/9 due to poor appetite---appetite appears to be picking up 8.  New onset PAF.  Currently appears to be in sinus rhythm.9.    Steroid -induced hyperglycemia on diabetes mellitus.  Hemoglobin A1c 8.8.  Sliding scale insulin.  NovoLog 3 3 times daily started on 6/8   CBG (last 3)  Recent Labs    08/15/18 1649 08/15/18 2105 08/16/18 0640  GLUCAP 188* 208* 170*    -cbgs extremely labile  Will follow for another day and may need to change regimen. 10.  Rheumatoid arthritis.  Patient on Enbrel 50 mg injection weekly and can be resumed as outpatient.  Continue chronic prednisone 5 mg daily 11.  AKI vs CKD.    We will continue to  follow. 12.  Hypoalbuminemia  Supplement initiated on 6/4 13.  Leukocytosis: Resolved-   14. Acute blood loss anemia  -check monday 15.  Lumbar back pain  Secondary degenerative disc disease as well as facet arthropathy of lumbar spine  L-spine x-ray reviewed suggesting degenerative changes  Controlled on 6/8 16.  Hypokalemia  Resolved. 17.  Hypophosphatemia  Supplemented x1 dose on 6/8 18.  Shortness of breath: Resolved  Chest x-ray from 5/31 reviewed, showing bilateral small pleural effusions and atelectasis  Follow-up x-ray stable  LOS: 10 days A FACE TO FACE EVALUATION WAS PERFORMED  Shelby Walker H Shelby Walker 08/16/2018, 9:51 AM

## 2018-08-17 ENCOUNTER — Inpatient Hospital Stay (HOSPITAL_COMMUNITY): Payer: Medicare Other

## 2018-08-17 LAB — GLUCOSE, CAPILLARY
Glucose-Capillary: 122 mg/dL — ABNORMAL HIGH (ref 70–99)
Glucose-Capillary: 189 mg/dL — ABNORMAL HIGH (ref 70–99)
Glucose-Capillary: 177 mg/dL — ABNORMAL HIGH (ref 70–99)
Glucose-Capillary: 173 mg/dL — ABNORMAL HIGH (ref 70–99)

## 2018-08-17 NOTE — Progress Notes (Signed)
Occupational Therapy Session Note  Patient Details  Name: Shelby Walker MRN: [id number] Date of Birth: 01-08-1934  Today's Date: 08/17/2018 OT Individual Time: 0164-2903 OT Individual Time Calculation (min): 60 min    Short Term Goals: Week 2:  OT Short Term Goal 1 (Week 2): STGs = LTGs  Skilled Therapeutic Interventions/Progress Updates:    Pt received EOB c/o pain in her bottom described as being sore. Pt requested to use bathroom. Pt completed ambulatory transfer with RW with (S) into bathroom. She managed clothing without assistance and transferred onto toilet with close (S). Pt had stool incontinence in brief, which she was aware of. Pt given several minutes to void bowels further and then was agreeable to shower. It was observed that pt is often wiping back to front and is thus at risk for a UTI. Pt was edu on this and proper wiping technique. Pt completed rest of hygiene with (S).  Pt completed transfer onto [facility] in shower with CGA. Pt able to wash UB and LB with (S) seated. CGA provided during sit > stand and peri hygiene d/t slippery floor. Pt reporting pain relief with warm water in shower. Pt transferred out of shower to EOB. She donned shirt and pants with (S). Pt had another incident of bowel incontinence and brief was changed, as well as butt cream applied. Pt transferred into recliner and was left sitting up with all needs met, chair alarm set.    Therapy Documentation Precautions:  Precautions Precautions: Fall Precaution Comments: left knee buckles Restrictions Weight Bearing Restrictions: No   Therapy/Group: Individual Therapy  Curtis Sites 08/17/2018, 7:15 AM

## 2018-08-17 NOTE — Progress Notes (Signed)
Physical Therapy Session Note  Patient Details  Name: Shelby Walker MRN: [id number] Date of Birth: [birth year]  Today's Date: 08/17/2018 PT Individual Time: 1464-3142 PT Individual Time Calculation (min): 58 min   Short Term Goals: Week 2:  PT Short Term Goal 1 (Week 2): Patient to ambulate 66' with RW and CGA PT Short Term Goal 2 (Week 2): Patient to demonstrate standing balance with CGA during functional task. PT Short Term Goal 3 (Week 2): Patient to negotiate 4 steps wtih rails and CGA  Skilled Therapeutic Interventions/Progress Updates:    Pt seated in recliner upon PT arrival, agreeable to therapy tx and denies pain. Pt reports having to use the bathroom. Pt transferred sit<>stand with min assist and ambulated from recliner>toilet x 10 ft with RW and CGA. Pt maintained standing balance with CGA while performing clothing management, pt continent of bowel and bladder, performed pericare without assist. Pt standing at the sink with supervision to wash hands, ambulated back to recliner. Pt ambulated x 50 ft to the gym with RW and CGA. Pt worked on standing balance while performing card matching activity x 2 trials and then to perform toe taps on aerobic step 2 x 10 with CGA. Pt performed 2 x 10 sit<>stands for LE strengthening this session. Pt ambulated x 60 ft to the nustep with RW and CGA, transferred on/off nustep this session with supervision. Pt used nustep x 6 minutes for global strengthening and endurance on workload 5. Pt ambulated back to room x 80 ft with RW and CGA, transferred to bed and left supine with needs in reach and bed alarm set.    Therapy Documentation Precautions:  Precautions Precautions: Fall Precaution Comments: left knee buckles Restrictions Weight Bearing Restrictions: No    Therapy/Group: Individual Therapy  Netta Corrigan, PT, DPT 08/17/2018, 7:57 AM

## 2018-08-17 NOTE — Progress Notes (Signed)
[place] PHYSICAL MEDICINE & REHABILITATION PROGRESS NOTE  Subjective/Complaints:  Patient continues to have loose stools.  Started vancomycin again yesterday.  Objective: Physical Exam: BP (!) 114/50 (BP Location: Left Arm)   Pulse 86   Temp 99 F (37.2 C) (Oral)   Resp 18   Ht 5\' 1"  (1.549 m)   Wt 60.1 kg   SpO2 97%   BMI 25.03 kg/m  Elderly female in no acute distress.  HEENT exam atraumatic, normocephalic.  Oral membranes are moist. Neck is supple Chest clear to auscultation Cardiac exam S1 and S2 are regular Abdominal exam thin, active bowel sounds, soft Extremities without edema.   Assessment/Plan: 1. Functional deficits secondary to debility   Medical Problem List and Plan: 1.  Debility secondary to septic shock due to C. difficile colitis (completed course of vancomycin).  Contact precautions 2.  Antithrombotics: -DVT/anticoagulation: Eliquis             -antiplatelet therapy: N/A 3. Pain Management: Currently no pain. 4. Mood: Provide emotional support             -antipsychotic agents: N/A 5. Neuropsych: This patient is ?  Fully capable of making decisions on her own behalf. 6. Skin/Wound Care: Routine skin checks 7. Fluids/Electrolytes/Nutrition: Routine in and outs  Megace ordered on 6/9 due to poor appetite---appetite appears to be picking up 8.  New onset PAF.  Currently appears to be in sinus rhythm.9.    Steroid -induced hyperglycemia on diabetes mellitus.  Hemoglobin A1c 8.8.  Sliding scale insulin.  NovoLog 3 3 times daily started on 6/8   CBG (last 3)  Recent Labs    08/16/18 1651 08/16/18 2059 08/17/18 0617  GLUCAP 342* 193* 122*    -cbgs extremely labile  Will follow for another day and may need to change regimen. 10.  Rheumatoid arthritis.  Patient on Enbrel 50 mg injection weekly and can be resumed as outpatient.  Continue chronic prednisone 5 mg daily 11.  AKI vs CKD.    We will continue to follow. 12.  Hypoalbuminemia  Supplement  initiated on 6/4 13.  Leukocytosis: Resolved-  C. difficile colitis.  Patient with known C. difficile colitis on 07/24/2018.  She had multiple loose stools yesterday.  No obvious source other than recurrent C. difficile colitis.  There is no need to retest at this time.  Testing will almost certainly result in positive C. difficile.  Testing.  Will begin empiric treatment.  Vancomycin 125 mg p.o. 4 times daily.  I would recommend 10 to 14 days to begin with.  This should then be tapered according to a pulse protocol. 14. Acute blood loss anemia  -check monday 15.  Lumbar back pain  Secondary degenerative disc disease as well as facet arthropathy of lumbar spine  L-spine x-ray reviewed suggesting degenerative changes  Controlled on 6/8 16.  Hypokalemia  Resolved. 17.  Hypophosphatemia  Supplemented x1 dose on 6/8 18.  Shortness of breath: Resolved  Chest x-ray from 5/31 reviewed, showing bilateral small pleural effusions and atelectasis  Follow-up x-ray stable  LOS: 11 days A FACE TO FACE EVALUATION WAS PERFORMED  Jr Milliron H Esau Fridman 08/17/2018, 7:16 AM

## 2018-08-18 ENCOUNTER — Inpatient Hospital Stay (HOSPITAL_COMMUNITY): Payer: Medicare Other | Admitting: Physical Therapy

## 2018-08-18 ENCOUNTER — Inpatient Hospital Stay (HOSPITAL_COMMUNITY): Payer: Medicare Other | Admitting: Occupational Therapy

## 2018-08-18 DIAGNOSIS — R197 Diarrhea, unspecified: Secondary | ICD-10-CM

## 2018-08-18 DIAGNOSIS — D696 Thrombocytopenia, unspecified: Secondary | ICD-10-CM

## 2018-08-18 DIAGNOSIS — I10 Essential (primary) hypertension: Secondary | ICD-10-CM

## 2018-08-18 LAB — CBC
HCT: 26 % — ABNORMAL LOW (ref 36.0–46.0)
Hemoglobin: 8.3 g/dL — ABNORMAL LOW (ref 12.0–15.0)
MCH: 29.4 pg (ref 26.0–34.0)
MCHC: 31.9 g/dL (ref 30.0–36.0)
MCV: 92.2 fL (ref 80.0–100.0)
Platelets: 131 10*3/uL — ABNORMAL LOW (ref 150–400)
RBC: 2.82 MIL/uL — ABNORMAL LOW (ref 3.87–5.11)
RDW: 17.1 % — ABNORMAL HIGH (ref 11.5–15.5)
WBC: 6.7 10*3/uL (ref 4.0–10.5)
nRBC: 0 % (ref 0.0–0.2)

## 2018-08-18 LAB — BASIC METABOLIC PANEL
Anion gap: 9 (ref 5–15)
BUN: 30 mg/dL — ABNORMAL HIGH (ref 8–23)
CO2: 23 mmol/L (ref 22–32)
Calcium: 7.8 mg/dL — ABNORMAL LOW (ref 8.9–10.3)
Chloride: 109 mmol/L (ref 98–111)
Creatinine, Ser: 1.08 mg/dL — ABNORMAL HIGH (ref 0.44–1.00)
GFR calc Af Amer: 55 mL/min — ABNORMAL LOW (ref >60)
GFR calc non Af Amer: 47 mL/min — ABNORMAL LOW (ref >60)
Glucose, Bld: 151 mg/dL — ABNORMAL HIGH (ref 70–99)
Potassium: 2.9 mmol/L — ABNORMAL LOW (ref 3.5–5.1)
Sodium: 141 mmol/L (ref 135–145)

## 2018-08-18 LAB — GLUCOSE, CAPILLARY
Glucose-Capillary: 146 mg/dL — ABNORMAL HIGH (ref 70–99)
Glucose-Capillary: 230 mg/dL — ABNORMAL HIGH (ref 70–99)
Glucose-Capillary: 237 mg/dL — ABNORMAL HIGH (ref 70–99)
Glucose-Capillary: 244 mg/dL — ABNORMAL HIGH (ref 70–99)

## 2018-08-18 MED ORDER — POTASSIUM CHLORIDE CRYS ER 20 MEQ PO TBCR
20.0000 meq | EXTENDED_RELEASE_TABLET | Freq: Three times a day (TID) | ORAL | Status: AC
  Administered 2018-08-18 – 2018-08-19 (×6): 20 meq via ORAL
  Filled 2018-08-18 (×5): qty 1

## 2018-08-18 MED ORDER — CARVEDILOL 3.125 MG PO TABS
3.1250 mg | ORAL_TABLET | Freq: Two times a day (BID) | ORAL | Status: DC
  Administered 2018-08-18 – 2018-08-23 (×11): 3.125 mg via ORAL
  Filled 2018-08-18 (×11): qty 1

## 2018-08-18 NOTE — Progress Notes (Signed)
[place] PHYSICAL MEDICINE & REHABILITATION PROGRESS NOTE  Subjective/Complaints: Patient states she did not sleep well overnight nor had a good weekend due to diarrhea.  Discussed with nursing.  ROS: + Diarrhea.  Denies CP, shortness of breath, nausea, vomiting.  Objective: Vital Signs: Blood pressure (!) 133/55, pulse (!) 103, temperature 98.6 F (37 C), resp. rate 20, height 5\' 1"  (1.549 m), weight 56.3 kg, SpO2 99 %. No results found. Recent Labs    08/18/18 0609  WBC 6.7  HGB 8.3*  HCT 26.0*  PLT 131*   Recent Labs    08/18/18 0609  NA 141  K 2.9*  CL 109  CO2 23  GLUCOSE 151*  BUN 30*  CREATININE 1.08*  CALCIUM 7.8*    Physical Exam: BP (!) 133/55 (BP Location: Right Arm)   Pulse (!) 103   Temp 98.6 F (37 C)   Resp 20   Ht 5\' 1"  (1.549 m)   Wt 56.3 kg   SpO2 99%   BMI 23.45 kg/m  Constitutional: No distress . Vital signs reviewed. HENT: Normocephalic.  Atraumatic. Eyes: EOMI. No discharge. Cardiovascular: No JVD. Respiratory: Normal effort. GI: Non-distended. Musc: No edema or tenderness in extremities. Musc: Joint deformities due to RA Neurological: She is alert.  Follows commands.   HOH Motor: Right upper extremity: 5/5 proximal distal,Right lower extremity: 4-4+/5 proximal distal,  Left upper extremity: 4-4+/5 proximal distal,  Left lower extremity: 4/5 proximal to distal  Skin: Skin is warm and dry.  Psychiatric: pleasant   Assessment/Plan: 1. Functional deficits secondary to debility which require 3+ hours per day of interdisciplinary therapy in a comprehensive inpatient rehab setting.  Physiatrist is providing close team supervision and 24 hour management of active medical problems listed below.  Physiatrist and rehab team continue to assess barriers to discharge/monitor patient progress toward functional and medical goals  Care Tool:  Bathing    Body parts bathed by patient: Right arm, Left arm, Chest, Abdomen, Right upper  leg, Left upper leg, Face, Front perineal area, Buttocks, Right lower leg, Left lower leg   Body parts bathed by helper: Buttocks, Front perineal area, Right lower leg, Left lower leg     Bathing assist Assist Level: Contact Guard/Touching assist     Upper Body Dressing/Undressing Upper body dressing   What is the patient wearing?: Pull over shirt    Upper body assist Assist Level: Supervision/Verbal cueing    Lower Body Dressing/Undressing Lower body dressing      What is the patient wearing?: Pants     Lower body assist Assist for lower body dressing: Supervision/Verbal cueing     Toileting Toileting    Toileting assist Assist for toileting: Contact Guard/Touching assist     Transfers Chair/bed transfer  Transfers assist     Chair/bed transfer assist level: Contact Guard/Touching assist     Locomotion Ambulation   Ambulation assist      Assist level: Contact Guard/Touching assist Assistive device: Walker-rolling Max distance: 50 ft   Walk 10 feet activity   Assist  Walk 10 feet activity did not occur: Safety/medical concerns(Per PT)  Assist level: Contact Guard/Touching assist Assistive device: Walker-rolling   Walk 50 feet activity   Assist Walk 50 feet with 2 turns activity did not occur: Safety/medical concerns(Per PT)  Assist level: Contact Guard/Touching assist Assistive device: Walker-rolling    Walk 150 feet activity   Assist Walk 150 feet activity did not occur: Safety/medical concerns(Per PT)  Assist level: Contact Guard/Touching assist Assistive  device: Walker-rolling    Walk 10 feet on uneven surface  activity   Assist Walk 10 feet on uneven surfaces activity did not occur: Safety/medical concerns(Per PT)   Assist level: Minimal Assistance - Patient > 75% Assistive device: Aeronautical engineer Will patient use wheelchair at discharge?: No Type of Wheelchair: Manual    Wheelchair assist  level: Supervision/Verbal cueing Max wheelchair distance: 50'    Wheelchair 50 feet with 2 turns activity    Assist    Wheelchair 50 feet with 2 turns activity did not occur: Safety/medical concerns(Per PT)   Assist Level: Supervision/Verbal cueing   Wheelchair 150 feet activity     Assist Wheelchair 150 feet activity did not occur: Safety/medical concerns(Per PT)   Assist Level: Supervision/Verbal cueing      Medical Problem List and Plan: 1.  Debility secondary to septic shock due to C. difficile colitis (completed course of vancomycin).  Contact precautions  Continue CIR 2.  Antithrombotics: -DVT/anticoagulation: Eliquis             -antiplatelet therapy: N/A 3. Pain Management: Oxycodone as needed  -appropriate shoe wear to accommodate right great toe 4. Mood: Provide emotional support             -antipsychotic agents: N/A 5. Neuropsych: This patient is ?  Fully capable of making decisions on her own behalf. 6. Skin/Wound Care: Routine skin checks 7. Fluids/Electrolytes/Nutrition: Routine in and outs  Megace ordered on 6/9 due to poor appetite, variable intake on 6/15 8.  New onset PAF.  Continue Eliquis.    Lopressor 12.5 mg twice daily, changed to Coreg on 6/8 due to hypotension, DC'd on 6/9 due to hypotension/bradycardia             Coreg 3.25 restarted on 6/15 due to elevated heart rate and blood pressure Vitals:   08/17/18 1937 08/18/18 0538  BP: (!) 159/53 (!) 133/55  Pulse: 96 (!) 103  Resp: 18 20  Temp: 98.6 F (37 C)   SpO2: 99% 99%   9.    Steroid-induced hyperglycemia on diabetes mellitus.  Hemoglobin A1c 8.8.  Sliding scale insulin.  NovoLog 3 3 times daily started on 6/8  Insulin NPH 5 units twice daily started on 6/12   CBG (last 3)  Recent Labs    08/17/18 1642 08/17/18 2125 08/18/18 0628  GLUCAP 177* 173* 146*    Labile on 6/15  Monitor with increased mobility 10.  Rheumatoid arthritis.  Patient on Enbrel 50 mg injection weekly  and can be resumed as outpatient.  Continue chronic prednisone 5 mg daily 11.  AKI vs CKD.    Appreciate nephro recs, adjusting diuretics             Creatinine 1.08 on 6/15  Encourage fluids  Continue to monitor 12.  Hypoalbuminemia  Supplement initiated on 6/4 13.  Leukocytosis: Resolved-likely steroid-induced +/- C. difficile  WBCs 6.7 on 6/15  Afebrile  Continue to monitor 14. Acute blood loss anemia  Hemoglobin 8.3 on 6/15-continues to trend down, labs ordered for tomorrow  1 of 2 stools OB+  Continue to monitor 15.  Lumbar back pain  Secondary degenerative disc disease as well as facet arthropathy of lumbar spine  L-spine x-ray reviewed suggesting degenerative changes  Controlled on 6/8 16.  Hypokalemia  Potassium 2.9 on 6/15  Supplement initiated on 6/15 17.  Hypophosphatemia  Supplemented x1 dose on 6/8 18.  Shortness of breath: Resolved  Chest x-ray  from 5/31 reviewed, showing bilateral small pleural effusions and atelectasis  Follow-up x-ray stable 19. Diarrhea  Vanc restarted  C. Diff pending 20.  Thrombocytopenia  Platelets 131 on 6/15  Labs ordered for tomorrow  LOS: 12 days A FACE TO FACE EVALUATION WAS PERFORMED  Ankit Lorie Phenix 08/18/2018, 9:55 AM

## 2018-08-18 NOTE — Progress Notes (Signed)
Occupational Therapy Session Note  Patient Details  Name: Shelby Walker MRN: [id number] Date of Birth: [birth year]  Today's Date: 08/18/2018 OT Individual Time: 4982-6415 OT Individual Time Calculation (min): 45 min    Short Term Goals: Week 1:  OT Short Term Goal 1 (Week 1): Patient will completed UB and LB bathing and dressing at Supervision level using LRAD. OT Short Term Goal 1 - Progress (Week 1): Progressing toward goal OT Short Term Goal 2 (Week 1): Patient will complete toilet transfers at Supervision with DME if needed.  OT Short Term Goal 2 - Progress (Week 1): Progressing toward goal OT Short Term Goal 3 (Week 1): Patient will increase sitting tolerance by completing a task for 15 minutes or more in order to participate in ADL with less rest breaks. OT Short Term Goal 3 - Progress (Week 1): Met OT Short Term Goal 4 (Week 1): Patient will complete simulated walk-in shower transfer at Supervision level with Shower seat if needed. OT Short Term Goal 4 - Progress (Week 1): Progressing toward goal OT Short Term Goal 5 (Week 1): Patient will increase BUE strength and endurance to 4-/5 to increase participation in ADL tasks and functional transfers.  OT Short Term Goal 5 - Progress (Week 1): Met Week 2:  OT Short Term Goal 1 (Week 2): STGs = LTGs      Skilled Therapeutic Interventions/Progress Updates:    Pt received in recliner dressed and ready for the day.  She said she was still clean from her shower yesterday. Discussed her upcoming discharge date on Saturday.  Pt is very worried about this as she said she is still having so many problems with loose stools and her skin feels so raw. She has only been using the hospital toilet paper and wet washcloths.  Recommended her family bring in soft wet wipes she can use which will be much more gentle for her bottom. Specifically pH balanced feminine wipes.  Her son will purchase some today and bring them to the hospital.    Also discussed at  length her diet.  She has been getting large salads and having grapes, discussed which foods will be more gentle on her stomach.  Encouraged her to eat yogurt.   Discussed her anxieties about discharge.  Pt resting in recliner with chair alarm on and all needs met.   Therapy Documentation Precautions:  Precautions Precautions: Fall Precaution Comments: left knee buckles Restrictions Weight Bearing Restrictions: No  Pain: Pain Assessment Pain Scale: 0-10 Pain Score: 0-No pain    Therapy/Group: Individual Therapy  [place] 08/18/2018, 10:24 AM

## 2018-08-18 NOTE — Progress Notes (Signed)
Physical Therapy Session Note  Patient Details  Name: Shelby Walker MRN: [id number] Date of Birth: [birth year]  Today's Date: 08/18/2018 PT Individual Time: 0900-1000 PT Individual Time Calculation (min): 60 min   Short Term Goals: Week 2:  PT Short Term Goal 1 (Week 2): Patient to ambulate 38' with RW and CGA PT Short Term Goal 2 (Week 2): Patient to demonstrate standing balance with CGA during functional task. PT Short Term Goal 3 (Week 2): Patient to negotiate 4 steps wtih rails and CGA  Skilled Therapeutic Interventions/Progress Updates:  Pt on way to bathroom with nurse. Pt agreeable to therapy. Does report not feeling well due to multiple episodes of diarrhea today.   Supervision to walk to toilet with RW and turn around. Pt able to self manage clothing to sit on toilet. Pt able to self perform peri care seated and standing. Min guard for standing balance with pt self managing clothing expect for assist to secure depends. Supervison for gait to wash hands and return to recliner. Seated in recliner worked on the following exercises: level 2 band resisted hamstring curls, marching and  clam shells all for 2 sets of 10 reps with rest breaks between. With 3# ankle weights pt performed  long arc quads for 2 sets of 10 reps. Cues needed with all ex's for form and to slow down. In standing with RW support pt performed the following: alternating marching, alternating hip abduction and mini squats for 10 reps each with seated rest breaks between. Pt needing to use rest room again. Supervision with RW to ambulate into/out of bathroom. Supervision for all sit<>stand transfers this session. Min guard once again for standing balance with pt self managing clothing and independent with peri care/washing hands afterwards.   Pt left in recliner with feet up, belt alarm on and all needs in reach. Remained in room this session due to frequency of needing the toilet both pt and RN reported this morning. RN notified  of pt's second trip to bathroom by NT.   Therapy Documentation Precautions:  Precautions Precautions: Fall Precaution Comments: left knee buckles Restrictions Weight Bearing Restrictions: No    Therapy/Group: Individual Therapy  Willow Ora, PTA 08/18/2018, 4:32 PM

## 2018-08-18 NOTE — Progress Notes (Signed)
Occupational Therapy Session Note  Patient Details  Name: Shelby Walker MRN: [id number] Date of Birth: [birth year]  Today's Date: 08/18/2018 OT Individual Time: 6294-7654 and 6503-5465 OT Individual Time Calculation (min): 26 min and 72 min   Short Term Goals: Week 2:  OT Short Term Goal 1 (Week 2): STGs = LTGs  Skilled Therapeutic Interventions/Progress Updates:    Session 1: Upon entering the room, pt seated in recliner chair with no c/o pain and agreeable to OT intervention. Pt verbalized need for toileting and standing from recliner chair with CGA for safety. Pt ambulating 15' with close supervision to bathroom and managed clothing with CGA as well. Pt able to void urine and continuing to have diarrhea. Pt becoming very emotional and stating, " I'm so tired of going to the bathroom and my backside is sore from all this wiping." OT providing therapeutic use of self and encouragement this session. Pt able to perform hygiene independently with min guard for balance. OT assisted pt with cream on buttocks and fastening new brief. Pt ambulating to sink for hand hygiene and performed task in standing with close supervision. Pt returning to recliner chair with chair alarm belt donned and call bell within reach.  Session 2: Pt remains in recliner chair with no c/o pain. Pt ambulating 65' with RW and CGA while taking standing rest break and then ambulating another 100' before returning to room. Pt taking seated rest break secondary to fatigue. Pt engaged in short distance side stepping 10' from recliner chair to sofa with CGA for balance and use of RW. Pt able to stand from sofa in room with close supervision and min guard with cuing for forward weight shift. Pt returning to recliner chair with B UE strengthening with use of level 2 theraband. Pt demonstrating 2 sets of 10 chest pulls, alternating punches, shoulder elevation, and shoulder diagonals with rest breaks as needed secondary to fatigue.Pt needing min  cuing for proper technique. Pt remained in recliner chair at end of session with chair alarm belt donned and call bell within reach.   Therapy Documentation Precautions:  Precautions Precautions: Fall Precaution Comments: left knee buckles Restrictions Weight Bearing Restrictions: No \\Pain : Pain Assessment Pain Score: 0-No pain ADL: ADL Eating: Modified independent Where Assessed-Eating: Edge of bed Grooming: Setup Where Assessed-Grooming: Sitting at sink Upper Body Bathing: Setup Where Assessed-Upper Body Bathing: Sitting at sink Lower Body Bathing: Minimal assistance Where Assessed-Lower Body Bathing: Sitting at sink Upper Body Dressing: Minimal assistance Where Assessed-Upper Body Dressing: Sitting at sink Lower Body Dressing: Minimal assistance Where Assessed-Lower Body Dressing: Sitting at sink Toileting: Moderate assistance Where Assessed-Toileting: Glass blower/designer: Moderate assistance Toilet Transfer Method: Stand pivot   Therapy/Group: Individual Therapy  Gypsy Decant 08/18/2018, 1:39 PM

## 2018-08-19 ENCOUNTER — Inpatient Hospital Stay (HOSPITAL_COMMUNITY): Payer: Medicare Other | Admitting: Physical Therapy

## 2018-08-19 ENCOUNTER — Inpatient Hospital Stay (HOSPITAL_COMMUNITY): Payer: Medicare Other

## 2018-08-19 ENCOUNTER — Inpatient Hospital Stay (HOSPITAL_COMMUNITY): Payer: Medicare Other | Admitting: Occupational Therapy

## 2018-08-19 LAB — BASIC METABOLIC PANEL
Anion gap: 8 (ref 5–15)
BUN: 28 mg/dL — ABNORMAL HIGH (ref 8–23)
CO2: 22 mmol/L (ref 22–32)
Calcium: 7.8 mg/dL — ABNORMAL LOW (ref 8.9–10.3)
Chloride: 113 mmol/L — ABNORMAL HIGH (ref 98–111)
Creatinine, Ser: 1.19 mg/dL — ABNORMAL HIGH (ref 0.44–1.00)
GFR calc Af Amer: 49 mL/min — ABNORMAL LOW (ref >60)
GFR calc non Af Amer: 42 mL/min — ABNORMAL LOW (ref >60)
Glucose, Bld: 147 mg/dL — ABNORMAL HIGH (ref 70–99)
Potassium: 4 mmol/L (ref 3.5–5.1)
Sodium: 143 mmol/L (ref 135–145)

## 2018-08-19 LAB — C DIFFICILE QUICK SCREEN W PCR REFLEX
C Diff antigen: NEGATIVE
C Diff interpretation: NOT DETECTED
C Diff toxin: NEGATIVE

## 2018-08-19 LAB — CBC WITH DIFFERENTIAL/PLATELET
Abs Immature Granulocytes: 0.02 10*3/uL (ref 0.00–0.07)
Basophils Absolute: 0 10*3/uL (ref 0.0–0.1)
Basophils Relative: 1 %
Eosinophils Absolute: 0.2 10*3/uL (ref 0.0–0.5)
Eosinophils Relative: 5 %
HCT: 27.4 % — ABNORMAL LOW (ref 36.0–46.0)
Hemoglobin: 8.6 g/dL — ABNORMAL LOW (ref 12.0–15.0)
Immature Granulocytes: 0 %
Lymphocytes Relative: 34 %
Lymphs Abs: 1.7 10*3/uL (ref 0.7–4.0)
MCH: 28.9 pg (ref 26.0–34.0)
MCHC: 31.4 g/dL (ref 30.0–36.0)
MCV: 91.9 fL (ref 80.0–100.0)
Monocytes Absolute: 0.4 10*3/uL (ref 0.1–1.0)
Monocytes Relative: 8 %
Neutro Abs: 2.6 10*3/uL (ref 1.7–7.7)
Neutrophils Relative %: 52 %
Platelets: 164 10*3/uL (ref 150–400)
RBC: 2.98 MIL/uL — ABNORMAL LOW (ref 3.87–5.11)
RDW: 17 % — ABNORMAL HIGH (ref 11.5–15.5)
WBC: 4.9 10*3/uL (ref 4.0–10.5)
nRBC: 0 % (ref 0.0–0.2)

## 2018-08-19 LAB — GLUCOSE, CAPILLARY
Glucose-Capillary: 135 mg/dL — ABNORMAL HIGH (ref 70–99)
Glucose-Capillary: 249 mg/dL — ABNORMAL HIGH (ref 70–99)
Glucose-Capillary: 247 mg/dL — ABNORMAL HIGH (ref 70–99)
Glucose-Capillary: 153 mg/dL — ABNORMAL HIGH (ref 70–99)

## 2018-08-19 MED ORDER — INSULIN ASPART 100 UNIT/ML ~~LOC~~ SOLN
5.0000 [IU] | Freq: Three times a day (TID) | SUBCUTANEOUS | Status: DC
  Administered 2018-08-19 – 2018-08-21 (×6): 5 [IU] via SUBCUTANEOUS

## 2018-08-19 NOTE — Progress Notes (Signed)
Physical Therapy Session Note  Patient Details  Name: Shelby Walker MRN: [id number] Date of Birth: August 29, 1933  Today's Date: 08/19/2018 PT Individual Time: 1310-1340 PT Individual Time Calculation (min): 30 min   Short Term Goals: Week 2:  PT Short Term Goal 1 (Week 2): Patient to ambulate 102' with RW and CGA PT Short Term Goal 2 (Week 2): Patient to demonstrate standing balance with CGA during functional task. PT Short Term Goal 3 (Week 2): Patient to negotiate 4 steps wtih rails and CGA  Skilled Therapeutic Interventions/Progress Updates:    Session focused on functional dynamic balance and gait in room with RW to simulate kitchen tasks or gathering clothing out of low drawers/shelves and how to manage items with RW. Would benefit from walker bag for transporting small items and discussed options for getting a tray if needed from DME store or online. Pt reports husband is limited in his ability to assist with things in the home. Administered Five Time Sit to Stand for fall risk assessment (see below for details). Dynamic balance and strengthening during heel raises, toe raises, lateral sidestepping, closed eyes static balance on non compliant and compliant surface (CGA/min assist), and stepping without AD to challenge balance (min to mod assist due to weakness in LLE). Pt able to functionally ambulate in room with RW at close supervision level while performing simulated household tasks. All needs in reach and seat belt alarm donned.   Therapy Documentation Precautions:  Precautions Precautions: Fall Precaution Comments: left knee buckles Restrictions Weight Bearing Restrictions: No  Pain:  No reports of pain during session.  Balance:   Five times Sit to Stand Test (FTSS) Method: Use a straight back chair with a solid seat that is 16-18" high. Ask participant to sit on the chair with arms folded across their chest.   Instructions: "Stand up and sit down as quickly as possible 5 times,  keeping your arms folded across your chest."   Measurement: Stop timing when the participant stands the 5th time.  TIME: ___45___ (in seconds) using BUE for sit -> stand  Times > 13.6 seconds is associated with increased disability and morbidity (Guralnik, 2000) Times > 15 seconds is predictive of recurrent falls in healthy individuals aged 52 and older (Buatois, et al., 2008) Normal performance values in community dwelling individuals aged 39 and older (Bohannon, 2006): o 60-69 years: 11.4 seconds o 70-79 years: 12.6 seconds o 80-89 years: 14.8 seconds  MCID: ? 2.3 seconds for Vestibular Disorders (Meretta, 2006)   Therapy/Group: Individual Therapy  Canary Brim Ivory Broad, PT, DPT, CBIS  08/19/2018, 2:00 PM

## 2018-08-19 NOTE — Progress Notes (Signed)
Physical Therapy Session Note  Patient Details  Name: Shelby Walker MRN: [id number] Date of Birth: 22-Feb-1934  Today's Date: 08/19/2018 PT Individual Time: 8270-7867 PT Individual Time Calculation (min): 74 min   Short Term Goals: Week 2:  PT Short Term Goal 1 (Week 2): Patient to ambulate 27' with RW and CGA PT Short Term Goal 2 (Week 2): Patient to demonstrate standing balance with CGA during functional task. PT Short Term Goal 3 (Week 2): Patient to negotiate 4 steps wtih rails and CGA  Skilled Therapeutic Interventions/Progress Updates:    Patient received awake, alert sitting in recliner and agreeable to therapy session. Sit>stand recliner>RW with CGA for steadying. Ambulated ~69ft to sink using RW with CGA and then reports onset of needing to have BM. Ambulated ~41ft to bathroom using RW with CGA. LB clothing management with min assist to ensure cleanliness. Pt incontinent of bowels in brief with additional continent bowels on toilet. Performed pericare with close supervision for standing balance. Sit<>stand BSC over toilet<>RW with CGA for steadying. Ambulated ~18ft to sink and performed standing hand hygiene with supervision. Ambulated ~224ft using RW to therapy gym with close supervision and w/c follow in event of fatigue. Ascended/descended 4 steps using R UE support on unilateral handrail with mod assist for lifting due to knee buckling. Ascended/descended 4 steps using bilateral UE support on unilateral handrail - trialed R HR with pt having increased difficulty due to L LE being weaker than R LE - swapped to L HR with pt demonstrating increased ease of movement and required min assist for balance and for minimal knee buckling with increased fatigue. Patient educated on need for either bilateral handrails or L handrail at home for increased safety with stair navigation - pt reports she plans to call her son after work and discuss this with him. Ambulated ~242ft using RW back to room with  CGA/close supervision due to increased fatigue as well as w/c follow for safety. Performed x10 repeated sit<>stand recliner<> no UE support on descent and min UE support on thighs with raising and min assist/CGA for lifting/lowering and balance. Pt left seated in recliner with needs in reach and seat belt alarm on.   Therapy Documentation Precautions:  Precautions Precautions: Fall Precaution Comments: left knee buckles Restrictions Weight Bearing Restrictions: No  Pain: Reports general neck and back pain - pt states she received medication this AM in preparation for therapy but that it is wearing off but denies increase in pain during therapy session and therapist set up heat pack on pt's back at end of session.    Therapy/Group: Individual Therapy  Tawana Scale, PT, DPT 08/19/2018, 7:58 AM

## 2018-08-19 NOTE — Progress Notes (Signed)
Occupational Therapy Session Note  Patient Details  Name: Shelby Walker MRN: [id number] Date of Birth: Feb 17, 1934  Today's Date: 08/19/2018 OT Individual Time: 3845-3646 OT Individual Time Calculation (min): 60 min    Short Term Goals: Week 1:  OT Short Term Goal 1 (Week 1): Patient will completed UB and LB bathing and dressing at Supervision level using LRAD. OT Short Term Goal 1 - Progress (Week 1): Progressing toward goal OT Short Term Goal 2 (Week 1): Patient will complete toilet transfers at Supervision with DME if needed.  OT Short Term Goal 2 - Progress (Week 1): Progressing toward goal OT Short Term Goal 3 (Week 1): Patient will increase sitting tolerance by completing a task for 15 minutes or more in order to participate in ADL with less rest breaks. OT Short Term Goal 3 - Progress (Week 1): Met OT Short Term Goal 4 (Week 1): Patient will complete simulated walk-in shower transfer at Supervision level with Shower seat if needed. OT Short Term Goal 4 - Progress (Week 1): Progressing toward goal OT Short Term Goal 5 (Week 1): Patient will increase BUE strength and endurance to 4-/5 to increase participation in ADL tasks and functional transfers.  OT Short Term Goal 5 - Progress (Week 1): Met Week 2:  OT Short Term Goal 1 (Week 2): STGs = LTGs     Skilled Therapeutic Interventions/Progress Updates:    Pt seen for toileting, shower, dressing, grooming today.  She was able to rise to stand from elevated toilet with S but needs CGA from lower recliner and tub bench.  Pt was able to toilet,bathe, don clothing all with close S and A for TED hose.  Pt very emotional about having C diff and has concerns about her family.   Education with patient and counseling with pt to calm her fears. Discussed preparing for d/c home this weekend.  Pt resting in recliner with all needs met and belt alarm on.   Therapy Documentation Precautions:  Precautions Precautions: Fall Precaution Comments: left  knee buckles Restrictions Weight Bearing Restrictions: No    Vital Signs: Therapy Vitals Temp: 98.7 F (37.1 C) Temp Source: Oral Pulse Rate: 79 Resp: 16 BP: (!) 125/53 Patient Position (if appropriate): Sitting Oxygen Therapy SpO2: 100 % O2 Device: Room Air Pain: Pain Assessment Pain Scale: 0-10 Pain Score: 0-No pain   Therapy/Group: Individual Therapy  [place] 08/19/2018, 8:56 AM

## 2018-08-19 NOTE — Progress Notes (Signed)
[place] PHYSICAL MEDICINE & REHABILITATION PROGRESS NOTE  Subjective/Complaints: Patient seen sitting up in bed this morning.  He states he slept well overnight.  He notes improvement in diarrhea.  She has questions regarding C. difficile.  ROS: Denies CP, shortness of breath, nausea, vomiting.  Objective: Vital Signs: Blood pressure (!) 125/53, pulse 79, temperature 98.7 F (37.1 C), temperature source Oral, resp. rate 16, height 5\' 1"  (1.549 m), weight 56.3 kg, SpO2 100 %. No results found. Recent Labs    08/18/18 0609 08/19/18 0542  WBC 6.7 4.9  HGB 8.3* 8.6*  HCT 26.0* 27.4*  PLT 131* 164   Recent Labs    08/18/18 0609 08/19/18 0542  NA 141 143  K 2.9* 4.0  CL 109 113*  CO2 23 22  GLUCOSE 151* 147*  BUN 30* 28*  CREATININE 1.08* 1.19*  CALCIUM 7.8* 7.8*    Physical Exam: BP (!) 125/53 (BP Location: Left Leg)   Pulse 79   Temp 98.7 F (37.1 C) (Oral)   Resp 16   Ht 5\' 1"  (1.549 m)   Wt 56.3 kg   SpO2 100%   BMI 23.45 kg/m  Constitutional: No distress . Vital signs reviewed. HENT: Normocephalic.  Atraumatic. Eyes: EOMI.  No discharge. Cardiovascular: No JVD. Respiratory: Normal effort. GI: Non-distended. Musc: No edema or tenderness in extremities. Musc: Joint deformities due to RA Neurological: She is alert.  Follows commands.   HOH Motor: Right upper extremity: 5/5 proximal distal,Right lower extremity: 4-4+/5 proximal distal.  Left upper extremity: 4-4+/5 proximal distal  Left lower extremity: 4-/5 proximal to distal Skin: Skin is warm and dry.  Psychiatric: pleasant   Assessment/Plan: 1. Functional deficits secondary to debility which require 3+ hours per day of interdisciplinary therapy in a comprehensive inpatient rehab setting.  Physiatrist is providing close team supervision and 24 hour management of active medical problems listed below.  Physiatrist and rehab team continue to assess barriers to discharge/monitor patient progress  toward functional and medical goals  Care Tool:  Bathing    Body parts bathed by patient: Right arm, Left arm, Chest, Abdomen, Right upper leg, Left upper leg, Face, Front perineal area, Buttocks, Right lower leg, Left lower leg   Body parts bathed by helper: Buttocks, Front perineal area, Right lower leg, Left lower leg     Bathing assist Assist Level: Contact Guard/Touching assist     Upper Body Dressing/Undressing Upper body dressing   What is the patient wearing?: Pull over shirt    Upper body assist Assist Level: Supervision/Verbal cueing    Lower Body Dressing/Undressing Lower body dressing      What is the patient wearing?: Pants     Lower body assist Assist for lower body dressing: Supervision/Verbal cueing     Toileting Toileting    Toileting assist Assist for toileting: Contact Guard/Touching assist     Transfers Chair/bed transfer  Transfers assist     Chair/bed transfer assist level: Contact Guard/Touching assist     Locomotion Ambulation   Ambulation assist      Assist level: Contact Guard/Touching assist Assistive device: Walker-rolling Max distance: 100'   Walk 10 feet activity   Assist  Walk 10 feet activity did not occur: Safety/medical concerns(Per PT)  Assist level: Contact Guard/Touching assist Assistive device: Walker-rolling   Walk 50 feet activity   Assist Walk 50 feet with 2 turns activity did not occur: Safety/medical concerns(Per PT)  Assist level: Contact Guard/Touching assist Assistive device: Walker-rolling    Walk 150  feet activity   Assist Walk 150 feet activity did not occur: Safety/medical concerns(Per PT)  Assist level: Contact Guard/Touching assist Assistive device: Walker-rolling    Walk 10 feet on uneven surface  activity   Assist Walk 10 feet on uneven surfaces activity did not occur: Safety/medical concerns(Per PT)   Assist level: Minimal Assistance - Patient > 75% Assistive device:  Aeronautical engineer Will patient use wheelchair at discharge?: No Type of Wheelchair: Manual    Wheelchair assist level: Supervision/Verbal cueing Max wheelchair distance: 50'    Wheelchair 50 feet with 2 turns activity    Assist    Wheelchair 50 feet with 2 turns activity did not occur: Safety/medical concerns(Per PT)   Assist Level: Supervision/Verbal cueing   Wheelchair 150 feet activity     Assist Wheelchair 150 feet activity did not occur: Safety/medical concerns(Per PT)   Assist Level: Supervision/Verbal cueing      Medical Problem List and Plan: 1.  Debility secondary to septic shock due to C. difficile colitis. Contact precautions  Continue CIR 2.  Antithrombotics: -DVT/anticoagulation: Eliquis             -antiplatelet therapy: N/A 3. Pain Management: Oxycodone as needed  -appropriate shoe wear to accommodate right great toe 4. Mood: Provide emotional support             -antipsychotic agents: N/A 5. Neuropsych: This patient is capable of making decisions on her own behalf. 6. Skin/Wound Care: Routine skin checks 7. Fluids/Electrolytes/Nutrition: Routine in and outs  Megace ordered on 6/9 due to poor appetite, variable intake on 6/16 8.  New onset PAF.  Continue Eliquis.    Lopressor 12.5 mg twice daily, changed to Coreg on 6/8 due to hypotension, DC'd on 6/9 due to hypotension/bradycardia             Coreg 3.25 restarted on 6/15 due to elevated heart rate and blood pressure Vitals:   08/18/18 2040 08/19/18 0559  BP: (!) 120/53 (!) 125/53  Pulse: 79 79  Resp: 16 16  Temp: 98.7 F (37.1 C) 98.7 F (37.1 C)  SpO2: 100% 100%   Control on 6/16 9.    Steroid-induced hyperglycemia on diabetes mellitus.  Hemoglobin A1c 8.8.  Sliding scale insulin.  NovoLog 3 3 times daily started on 6/8, increased to 5 3 times daily on 6/16  Insulin NPH 5 units twice daily started on 6/12 CBG (last 3)  Recent Labs    08/18/18 1743  08/18/18 2149 08/19/18 0739  GLUCAP 237* 244* 135*    Labile on 6/16  Monitor with increased mobility 10.  Rheumatoid arthritis.  Patient on Enbrel 50 mg injection weekly and can be resumed as outpatient.  Continue chronic prednisone 5 mg daily 11.  AKI vs CKD.    Appreciate nephro recs, adjusting diuretics             Creatinine 1.19 on 6/16  Encourage fluids  Continue to monitor 12.  Hypoalbuminemia  Supplement initiated on 6/4 13.  Leukocytosis: Resolved-likely steroid-induced +/- C. difficile  Continue to monitor 14. Acute blood loss anemia  Hemoglobin 8.6 on 6/16, labs ordered for tomorrow  1 of 2 stools OB+  Continue to monitor 15.  Lumbar back pain  Secondary degenerative disc disease as well as facet arthropathy of lumbar spine  L-spine x-ray reviewed suggesting degenerative changes  Controlled on 6/8 16.  Hypokalemia  Potassium 4.0 on 6/16  Supplement initiated on 6/15 17.  Hypophosphatemia  Supplemented x1 dose on 6/8 18.  Shortness of breath: Resolved  Chest x-ray from 5/31 reviewed, showing bilateral small pleural effusions and atelectasis  Follow-up x-ray stable 19. Diarrhea: Improving  Vanc restarted  C. Diff negative on 6/15, although ordered after several days of antibiotics 20.  Thrombocytopenia  Platelets 164 on 6/16  LOS: 13 days A FACE TO FACE EVALUATION WAS PERFORMED  Eyob Godlewski Lorie Phenix 08/19/2018, 9:25 AM

## 2018-08-19 NOTE — Progress Notes (Signed)
C-Diff stool collection to lab per order

## 2018-08-19 NOTE — Progress Notes (Signed)
Occupational Therapy Session Note  Patient Details  Name: Shelby Walker MRN: [id number] Date of Birth: October 06, 1933  Today's Date: 08/19/2018 OT Individual Time: 1410-1430 OT Individual Time Calculation (min): 20 min    Short Term Goals: Week 2:  OT Short Term Goal 1 (Week 2): STGs = LTGs  Skilled Therapeutic Interventions/Progress Updates:    Session focused on toileting tasks. Pt completed ambulatory transfer into bathroom with (S) using RW. Pt had bowel incontinence in brief. Pt was able to complete hygiene following toileting with mod I. Min A to don brief in standing. Pt washed hands in standing at sink with (S). Pt returned to sitting in recliner. Discussed skin breakdown observed on pt's upper buttocks with RN Santiago Glad . Applied foam dressing per her recommendation. Edu pt on indication/use. Pt was left sitting in recliner with all needs met.   Therapy Documentation Precautions:  Precautions Precautions: Fall Precaution Comments: left knee buckles Restrictions Weight Bearing Restrictions: No   Therapy/Group: Individual Therapy  Curtis Sites 08/19/2018, 7:24 AM

## 2018-08-19 NOTE — Progress Notes (Signed)
Initial Nutrition Assessment  RD working remotely.  DOCUMENTATION CODES:   Not applicable, unable to assess for malnutrition at this time  INTERVENTION:   - Continue MVI with minerals daily  - Continue Ensure Enlive po BID, each supplement provides 350 kcal and 20 grams of protein (chocolate flavor)  - Add Magic Cup TID with meals, each supplement provides 290 kcal and 9 grams of protein  - Continue Pro-stat 30 ml BID, each supplement provides 100 kcal and 15 grams of protein  - Add chocolate milk TID with meals per pt request, RD ordered via [place]  - Encourage adequate PO intake and provide feeding assistance as needed (pt reports feeling weak this morning)  - d/c Boost Breeze as pt is refusing >50%  NUTRITION DIAGNOSIS:   Increased nutrient needs related to acute illness, other (therapies) as evidenced by estimated needs.  GOAL:   Patient will meet greater than or equal to 90% of their needs  MONITOR:   PO intake, Supplement acceptance, Weight trends, Labs, I & O's  REASON FOR ASSESSMENT:   Consult Poor PO  ASSESSMENT:   85 year old female with PMH of DM, HTN, rheumatoid arthritis Pt presented 07/24/18 with nausea, vomiting, diarrhea. Pt had recently been seen at urgent care diagnosed with infectious colitis and completed 7-day course of Cipro and Flagyl without improvement. Pt was noted to have SIRS on evaluation in the ED. CT abdomen pelvis noted diffuse colitis without perforation or abscess. C. difficile specimen was positive and pt was maintained on vancomycin with contact precautions. Renal services consulted 5/27 for elevated creatinine. Renal ultrasound showed no hydronephrosis. There were some new mild to moderate abdominal ascites. Pt responded well to Lasix therapy. Pt admitted to CIR on 6/03.  6/09 - Megace ordered  RD consulted for poor PO intake. Noted C diff test pending.  Spoke with pt via phone call to room. Pt reports feeling  very weak this morning and states she hasn't yet gotten up. Pt reports she has not yet eaten breakfast because she hasn't sat up yet. Pt states that she does not feel hungry but will try to eat some of her breakfast because she does want to get stronger.  Pt reports that at the beginning of her CIR stay, she was not eating well but states that now she thinks she is eating much better.  Pt states, "I don't eat as much as most people" and "I haven't eaten a lot in years." Suspect pt's PO intake at baseline is low.  Pt states that for a while, she was not receiving any oral nutrition supplements "because they thought it was adding to my diarrhea." Pt reports that her favorite is the chocolate Ensure Enlive. Pt asks if she can have chocolate milk instead of Ensure Enlive. RD discussed the added benefits of Ensure Enlive (more calories, more protein, vitamins and minerals) and pt agrees to start drinking chocolate Ensure Enlive again. RD will also order chocolate Magic Cups and chocolate milk TID with meals.  Will d/c Boost Breeze at this time as pt has been refusing >50% per [facility]. Will continue with Pro-stat that MD ordered.  RD educated pt on the importance of adequate kcal and protein intake in maintaining lean muscle mass and preventing dry weight loss. Also discussed pt's increased kcal and protein needs related to acute illness and participation in therapies. Pt states "I really do want to get stronger" and states she will try to eat more at mealtimes and take supplements  as ordered.  Weight recorded as 140 lbs on admission to CIR, now down to 124 lbs. This is a an 11.4% weight loss in less than 1 month which is significant for timeframe. Suspect some of weight loss is related to fluid loss as pt was on diuretics at the beginning of CIR stay. However, overall downward trend is concerning given pt's variable PO intake. Suspect pt with some degree of malnutrition but unable to confirm at this time without  NFPE.  Reviewed RN edema assessment. Pt with non-pitting edema to BLE.  Meal Completion: 25-100% x last 8 recorded meals (averaging 63%)  Medications reviewed and include: Boost Breeze BID (pt refusing >50%), Ensure Enlive BID (pt refusing ~50%), Pro-stat 30 ml BID (pt refusing ~25%), SSI, Novolog 3 units TID with meals, Novolin N 5 units BID, Megace 400 mg daily, MVI with minerals daily, K-dur 20 mEq TID, Prednisone, Protonix  Labs reviewed: hemoglobin 8.6 CBG's: 135-244 x 24 hours  NUTRITION - FOCUSED PHYSICAL EXAM:  Unable to complete at this time. RD working remotely.  Diet Order:   Diet Order            Diet Carb Modified Fluid consistency: Thin; Room service appropriate? Yes  Diet effective now              EDUCATION NEEDS:   Education needs have been addressed  Skin:  Skin Assessment: Reviewed RN Assessment (MASD to buttocks)  Last BM:  08/19/18 large type 6  Height:   Ht Readings from Last 1 Encounters:  08/07/18 5\' 1"  (1.549 m)    Weight:   Wt Readings from Last 1 Encounters:  08/18/18 56.3 kg    Ideal Body Weight:  47.7 kg  BMI:  Body mass index is 23.45 kg/m.  Estimated Nutritional Needs:   Kcal:  1350-1550  Protein:  65-75 grams  Fluid:  >/= 1.5 L    Gaynell Face, MS, RD, LDN Inpatient Clinical Dietitian Pager: [phone] Weekend/After Hours: [phone]

## 2018-08-20 ENCOUNTER — Inpatient Hospital Stay (HOSPITAL_COMMUNITY): Payer: Medicare Other | Admitting: Physical Therapy

## 2018-08-20 ENCOUNTER — Inpatient Hospital Stay (HOSPITAL_COMMUNITY): Payer: Medicare Other | Admitting: Occupational Therapy

## 2018-08-20 LAB — CBC WITH DIFFERENTIAL/PLATELET
Abs Immature Granulocytes: 0.04 10*3/uL (ref 0.00–0.07)
Basophils Absolute: 0 10*3/uL (ref 0.0–0.1)
Basophils Relative: 1 %
Eosinophils Absolute: 0.2 10*3/uL (ref 0.0–0.5)
Eosinophils Relative: 4 %
HCT: 27.5 % — ABNORMAL LOW (ref 36.0–46.0)
Hemoglobin: 8.4 g/dL — ABNORMAL LOW (ref 12.0–15.0)
Immature Granulocytes: 1 %
Lymphocytes Relative: 37 %
Lymphs Abs: 1.9 10*3/uL (ref 0.7–4.0)
MCH: 28.6 pg (ref 26.0–34.0)
MCHC: 30.5 g/dL (ref 30.0–36.0)
MCV: 93.5 fL (ref 80.0–100.0)
Monocytes Absolute: 0.4 10*3/uL (ref 0.1–1.0)
Monocytes Relative: 9 %
Neutro Abs: 2.5 10*3/uL (ref 1.7–7.7)
Neutrophils Relative %: 48 %
Platelets: ADEQUATE 10*3/uL (ref 150–400)
RBC: 2.94 MIL/uL — ABNORMAL LOW (ref 3.87–5.11)
RDW: 17.3 % — ABNORMAL HIGH (ref 11.5–15.5)
WBC: 5 10*3/uL (ref 4.0–10.5)
nRBC: 0.4 % — ABNORMAL HIGH (ref 0.0–0.2)

## 2018-08-20 LAB — GLUCOSE, CAPILLARY
Glucose-Capillary: 136 mg/dL — ABNORMAL HIGH (ref 70–99)
Glucose-Capillary: 110 mg/dL — ABNORMAL HIGH (ref 70–99)
Glucose-Capillary: 202 mg/dL — ABNORMAL HIGH (ref 70–99)
Glucose-Capillary: 177 mg/dL — ABNORMAL HIGH (ref 70–99)

## 2018-08-20 MED ORDER — LOPERAMIDE HCL 2 MG PO CAPS: 2.0000 mg | ORAL_CAPSULE | ORAL | Status: DC | PRN

## 2018-08-20 NOTE — Progress Notes (Signed)
[place] PHYSICAL MEDICINE & REHABILITATION PROGRESS NOTE  Subjective/Complaints: Patient seen sitting up in her chair this morning.  She states she slept well overnight.  She has questions regarding C. difficile and discharge.  ROS: Denies CP, shortness of breath, nausea, vomiting.  Objective: Vital Signs: Blood pressure 123/69, pulse 77, temperature 98.1 F (36.7 C), resp. rate 18, height 5\' 1"  (1.549 m), weight 58.1 kg, SpO2 99 %. No results found. Recent Labs    08/19/18 0542 08/20/18 0423  WBC 4.9 5.0  HGB 8.6* 8.4*  HCT 27.4* 27.5*  PLT 164 PLATELET CLUMPS NOTED ON SMEAR, COUNT APPEARS ADEQUATE   Recent Labs    08/18/18 0609 08/19/18 0542  NA 141 143  K 2.9* 4.0  CL 109 113*  CO2 23 22  GLUCOSE 151* 147*  BUN 30* 28*  CREATININE 1.08* 1.19*  CALCIUM 7.8* 7.8*    Physical Exam: BP 123/69 (BP Location: Right Arm)   Pulse 77   Temp 98.1 F (36.7 C)   Resp 18   Ht 5\' 1"  (1.549 m)   Wt 58.1 kg   SpO2 99%   BMI 24.20 kg/m  Constitutional: No distress . Vital signs reviewed. HENT: Normocephalic.  Atraumatic. Eyes: EOMI.  No discharge. Cardiovascular: No JVD. Respiratory: Normal effort. GI: Non-distended. Musc: No edema or tenderness in extremities. Musc: Joint deformities due to RA Neurological: She is alert.  Follows commands.   HOH Motor: Right upper extremity: 5/5 proximal distal,Right lower extremity: 4-4+/5 proximal distal.  Left upper extremity: 4-4+/5 proximal distal  Left lower extremity: 4-/5 proximal to distal Skin: Skin is warm and dry.  Psychiatric: pleasant   Assessment/Plan: 1. Functional deficits secondary to debility which require 3+ hours per day of interdisciplinary therapy in a comprehensive inpatient rehab setting.  Physiatrist is providing close team supervision and 24 hour management of active medical problems listed below.  Physiatrist and rehab team continue to assess barriers to discharge/monitor patient progress toward  functional and medical goals  Care Tool:  Bathing    Body parts bathed by patient: Right arm, Left arm, Chest, Abdomen, Right upper leg, Left upper leg, Face, Front perineal area, Buttocks, Right lower leg, Left lower leg   Body parts bathed by helper: Buttocks, Front perineal area, Right lower leg, Left lower leg     Bathing assist Assist Level: Supervision/Verbal cueing     Upper Body Dressing/Undressing Upper body dressing   What is the patient wearing?: Pull over shirt    Upper body assist Assist Level: Set up assist    Lower Body Dressing/Undressing Lower body dressing      What is the patient wearing?: Pants, Incontinence brief     Lower body assist Assist for lower body dressing: Supervision/Verbal cueing     Toileting Toileting    Toileting assist Assist for toileting: Supervision/Verbal cueing     Transfers Chair/bed transfer  Transfers assist     Chair/bed transfer assist level: Contact Guard/Touching assist     Locomotion Ambulation   Ambulation assist      Assist level: Contact Guard/Touching assist Assistive device: Walker-rolling Max distance: 284ft   Walk 10 feet activity   Assist  Walk 10 feet activity did not occur: Safety/medical concerns(Per PT)  Assist level: Contact Guard/Touching assist Assistive device: Walker-rolling   Walk 50 feet activity   Assist Walk 50 feet with 2 turns activity did not occur: Safety/medical concerns(Per PT)  Assist level: Contact Guard/Touching assist Assistive device: Walker-rolling    Walk 150 feet  activity   Assist Walk 150 feet activity did not occur: Safety/medical concerns(Per PT)  Assist level: Contact Guard/Touching assist Assistive device: Walker-rolling    Walk 10 feet on uneven surface  activity   Assist Walk 10 feet on uneven surfaces activity did not occur: Safety/medical concerns(Per PT)   Assist level: Minimal Assistance - Patient > 75% Assistive device:  Aeronautical engineer Will patient use wheelchair at discharge?: No Type of Wheelchair: Manual    Wheelchair assist level: Supervision/Verbal cueing Max wheelchair distance: 50'    Wheelchair 50 feet with 2 turns activity    Assist    Wheelchair 50 feet with 2 turns activity did not occur: Safety/medical concerns(Per PT)   Assist Level: Supervision/Verbal cueing   Wheelchair 150 feet activity     Assist Wheelchair 150 feet activity did not occur: Safety/medical concerns(Per PT)   Assist Level: Supervision/Verbal cueing      Medical Problem List and Plan: 1.  Debility secondary to septic shock due to C. difficile colitis. Contact precautions  Continue CIR  Team conference today to discuss current and goals and coordination of care, home and environmental barriers, and discharge planning with nursing, case manager, and therapies.  2.  Antithrombotics: -DVT/anticoagulation: Eliquis             -antiplatelet therapy: N/A 3. Pain Management: Oxycodone as needed  -appropriate shoe wear to accommodate right great toe 4. Mood: Provide emotional support             -antipsychotic agents: N/A 5. Neuropsych: This patient is capable of making decisions on her own behalf. 6. Skin/Wound Care: Routine skin checks 7. Fluids/Electrolytes/Nutrition: Routine in and outs  Megace ordered on 6/9 due to poor appetite, variable intake on 6/16 8.  New onset PAF.  Continue Eliquis.    Lopressor 12.5 mg twice daily, changed to Coreg on 6/8 due to hypotension, DC'd on 6/9 due to hypotension/bradycardia             Coreg 3.25 restarted on 6/15 due to elevated heart rate and blood pressure Vitals:   08/19/18 1932 08/20/18 0545  BP: (!) 118/53 123/69  Pulse: 71 77  Resp: 15 18  Temp: 98.1 F (36.7 C)   SpO2: 99% 99%   Control on 6/17 9.    Steroid-induced hyperglycemia on diabetes mellitus.  Hemoglobin A1c 8.8.  Sliding scale insulin.  NovoLog 3 3 times daily  started on 6/8, increased to 5 3 times daily on 6/16  Insulin NPH 5 units twice daily started on 6/12 CBG (last 3)  Recent Labs    08/19/18 1652 08/19/18 2125 08/20/18 0641  GLUCAP 247* 153* 136*    Labile on 6/17  Monitor with increased mobility 10.  Rheumatoid arthritis.  Patient on Enbrel 50 mg injection weekly and can be resumed as outpatient.  Continue chronic prednisone 5 mg daily 11.  AKI vs CKD.    Appreciate nephro recs, adjusting diuretics             Creatinine 1.19 on 6/16  Encourage fluids  Continue to monitor 12.  Hypoalbuminemia  Supplement initiated on 6/4 13.  Leukocytosis: Resolved-likely steroid-induced +/- C. difficile  Continue to monitor 14. Acute blood loss anemia  Hemoglobin 8.4 on 6/17  1 of 2 stools OB+  Continue to monitor 15.  Lumbar back pain  Secondary degenerative disc disease as well as facet arthropathy of lumbar spine  L-spine x-ray reviewed suggesting degenerative changes  Controlled  on 6/8 16.  Hypokalemia  Potassium 4.0 on 6/16  Supplement initiated on 6/15 17.  Hypophosphatemia  Supplemented x1 dose on 6/8 18.  Shortness of breath: Resolved  Chest x-ray from 5/31 reviewed, showing bilateral small pleural effusions and atelectasis  Follow-up x-ray stable 19. Diarrhea: Improving  Vanc restarted  C. Diff negative on 6/15, although ordered after several days of antibiotics  Will discuss with ID.  Will discuss with PA. 20.  Thrombocytopenia  Platelets 164 on 6/16, clumps on 6/17  LOS: 14 days A FACE TO FACE EVALUATION WAS PERFORMED  Vick Filter Lorie Phenix 08/20/2018, 8:33 AM

## 2018-08-20 NOTE — Progress Notes (Signed)
Physical Therapy Weekly Progress Note  Patient Details  Name: Shelby Walker MRN: [id number] Date of Birth: 1933-10-28  Beginning of progress report period: August 13, 2018 End of progress report period: August 20, 2018  Today's Date: 08/20/2018   Patient has met 2 of 3 short term goals. Pt is making good progress towards overall goals with plan for family education. Pt does have some R knee buckling requiring min assist currently with stair negotiation. Pt has been somewhat limited due to fatigue and frequent need to use bathroom but is improving.   Patient continues to demonstrate the following deficits muscle weakness, decreased cardiorespiratoy endurance and decreased standing balance and decreased balance strategies, imbalance with decreased balance reactions and therefore will continue to benefit from skilled PT intervention to increase functional independence with mobility.  Patient progressing toward long term goals..  Continue plan of care.  PT Short Term Goals Week 2:  PT Short Term Goal 1 (Week 2): Patient to ambulate 34' with RW and CGA PT Short Term Goal 2 (Week 2): Patient to demonstrate standing balance with CGA during functional task. PT Short Term Goal 3 (Week 2): Patient to negotiate 4 steps wtih rails and CGA  Skilled Therapeutic Interventions/Progress Updates:  Pt not ambulating up to 200 feet with RW with supervision. Have been working on stair negotiation with left rail with up to min assist and occasional right knee buckling. Have also been working on LE strengthening (pt issued HEP to perform in room between therapies today) and on standing balance with and without support. Does continue to fatigue quickly with rest breaks needed. Limited at times by frequent need to use restroom for bowel movements with activity, this was improved today.   Therapy Documentation Precautions:  Precautions Precautions: Fall Precaution Comments: left knee buckles Restrictions Weight  Bearing Restrictions: No   Therapy/Group: Individual Therapy  Willow Ora, PTA 08/20/2018, 1:13 PM   Lars Masson, PT, DPT, CBIS

## 2018-08-20 NOTE — Progress Notes (Signed)
Occupational Therapy Session Note  Patient Details  Name: Shelby Walker MRN: [id number] Date of Birth: Sep 15, 1933  Today's Date: 08/20/2018 OT Individual Time: 4709-2957 OT Individual Time Calculation (min): 70 min    Short Term Goals: Week 2:  OT Short Term Goal 1 (Week 2): STGs = LTGs  Skilled Therapeutic Interventions/Progress Updates:    Treatment session with focus on ADL retraining, dynamic standing balance, and functional mobility in room. Pt received upright in recliner eating breakfast.  Engaged in discussion regarding DME for home with pt expressing desire to have 3 in 1 and shower seat.  Discussed insurance coverage with pt planning to discuss with family option of purchasing shower chair.  Pt ambulated around room with RW to gather clothing items prior to dressing at sit > stand level at overall supervision.  Pt donned bra with increased time due to RA and ultimately utilizing modified technique but completing independently.  Pt reports continued episodes of incontinence with standing and asking questions about resolution of c-diff.  Pt completed sit > stand from recliner with supervision with focus on activity tolerance. Pt reports need to toilet.  Incontinent of BM prior to making it to toilet and continued to empty bowels with loose stool while on toilet.  Pt completed all hygiene with setup assist.  Pt ambulated 150' with RW with supervision with no bowel accidents.  Returned to room and left in recliner with legs elevated to allow for increased edema management.   Therapy Documentation Precautions:  Precautions Precautions: Fall Precaution Comments: left knee buckles Restrictions Weight Bearing Restrictions: No General:   Vital Signs: Therapy Vitals Pulse Rate: 77 Resp: 18 BP: 123/69 Patient Position (if appropriate): Lying Oxygen Therapy SpO2: 99 % O2 Device: Room Air Pain:   Pt with no c/o pain   Therapy/Group: Individual Therapy  Simonne Come 08/20/2018, 8:49  AM

## 2018-08-20 NOTE — Progress Notes (Signed)
Physical Therapy Session Note  Patient Details  Name: Shelby Walker MRN: [id number] Date of Birth: [birth year]  Today's Date: 08/20/2018 PT Individual Time: 1000-1058 session 1; 13:01-13:58 session 2 PT Individual Time Calculation (min): 58 min session 1; 57 min session 2  Short Term Goals: Week 2:  PT Short Term Goal 1 (Week 2): Patient to ambulate 53' with RW and CGA PT Short Term Goal 2 (Week 2): Patient to demonstrate standing balance with CGA during functional task. PT Short Term Goal 3 (Week 2): Patient to negotiate 4 steps wtih rails and CGA  Skilled Therapeutic Interventions/Progress Updates:  Session 1 Pt seated in recliner and agreeable to therapy at this time. No reports of pain.   Pt supervision for all sit<>stand transfers with session.  Exercises: with feet up: ankle pumps, quad sets with 5 sec holds, straight leg raises with band assist and hip abduction/adduciton for 10-20 reps each, then with feet down on floor: heel/toe raises, long arc quads, marching, stepping out/in and level 2 resistance for clam shell, all 10 reps each. All of these to be issued in written form for pt to work on in her room between therapies (see below)  Access Code: [place]: [email or web address]  Date: 08/20/2018  Prepared by: Willow Ora   Exercises Supine Ankle Pumps - 20 reps - 1 sets - 1x daily - 5x weekly Quad Set - 15 reps - 1 sets - 5 hold - 1x daily - 5x weekly Long Sitting Straight Leg Raise with External Rotation - 10 reps - 1 sets - 1x daily - 5x weekly Supine Hip Abduction AROM - 10 reps - 1 sets - 1x daily - 5x weekly Seated Heel Toe Raises - 10 reps - 1 sets - 1x daily - 5x weekly Seated Long Arc Quad - 10 reps - 1 sets - 5 hold - 1x daily - 5x weekly Seated March - 10 reps - 1 sets - 1x daily - 5x weekly Seated Hip Abduction with Resistance - 10 reps - 1 sets - 1x daily - 5x weekly Seated Stepping - 10 reps - 1 sets - 1x daily - 5x weekly   Gait: 200 feet x  2 with RW, supervision with wheelchair follow in case of fatigue. In ortho gym worked on Industrial/product designer again this session. 2 tall steps with single rail on left: 1st rep pt attempted to try while slightly sideways with right knee buckling needing mod assist to recover. Positioned so that she was going straight up with both hands on rail with no buckling, min guard assist. Performed 2cd rep this way with min guard assist as well.  Balance: with UE support on counter top: side stepping left<>right x 3 laps each way, forward/backward marching x 3 laps each way and tandem gait forward/backward x 3 laps each way, all with min guard to min HHA. Seated rest breaks between each one. Cues needed on form and technique. With bil UE support on counter: heel/toe raises with emphasis on keeping knees straight and ex form/technique. With single UE on counter/opposite HHA: worked on single leg balance with small trash can lying on it's side- alternating foot taps, then alternating double foot taps for 10 reps each. Ended with alternating toe>heel>toe taps for 7 reps each side, pt unable to complete last 3 reps each side due to fatigue. Min guard to min assist for balance with cues for increased lifting and soft taps. Seated rest breaks between each activity.  Pt left seated  in wheelchair with needs in reach with nursing on way into room to transport her to 4MW room.   Session 2 Pt seated in wheelchair on arrival to room finishing her lunch. Agreed to PT at this time. Does complain of being cold in new room, therefore thermostat adjusted. Pt wanting to change from short sleeves to long sleeves. Able to do so independently once PTA handed her the shirt. Pt supervision for all sit<>stand transfers this session with and without UE support on standing.  Gait: ~200 feet x 2 with RW with supervision, wheelchair follow in case of fatigue. Cues needed with all gait for posture and to increase her base of support with gait. Pt  tripping over foot once with gait on way back to room, self recovered. With RW worked on Best boy around cones with 80 degree turn at end for 4 total laps, rest break after 2 laps was needed. Then had pt practice stepping over barriers (foam beams, foam wedges) for 2 laps with cues on technique and sequencing needed. Min guard to min assist for balance with these activities.   Balance: standing on airex with no UE support (sturdy surface in front of pt for safety/mat table behind her)- EC no head movements, progressing to EC head movements left<>right, up<>down and diagonals both ways for 8-10 reps each. No issues or symptoms reported with this. Min guard to min assist for balance with increased postural sway noted with up<>down head movements more than with others. Seated with feet on airex: sit<>stands x 10 reps with light UE assist, cues for full upright posture and slow, controlled descent to mat table. Min guard assist for balance.   Back in room pt left in wheelchair with belt alarm on and all needs in reach. No complaints in session.  Therapy Documentation Precautions:  Precautions Precautions: Fall Precaution Comments: left knee buckles Restrictions Weight Bearing Restrictions: No    Therapy/Group: Individual Therapy  Willow Ora, PTA 08/20/2018, 9:27 AM

## 2018-08-21 ENCOUNTER — Ambulatory Visit (HOSPITAL_COMMUNITY): Payer: Medicare Other | Admitting: Physical Therapy

## 2018-08-21 ENCOUNTER — Inpatient Hospital Stay (HOSPITAL_COMMUNITY): Payer: Medicare Other | Admitting: Occupational Therapy

## 2018-08-21 ENCOUNTER — Inpatient Hospital Stay (HOSPITAL_COMMUNITY): Payer: Medicare Other | Admitting: Physical Therapy

## 2018-08-21 LAB — GLUCOSE, CAPILLARY
Glucose-Capillary: 142 mg/dL — ABNORMAL HIGH (ref 70–99)
Glucose-Capillary: 114 mg/dL — ABNORMAL HIGH (ref 70–99)
Glucose-Capillary: 170 mg/dL — ABNORMAL HIGH (ref 70–99)
Glucose-Capillary: 130 mg/dL — ABNORMAL HIGH (ref 70–99)

## 2018-08-21 MED ORDER — CARVEDILOL 3.125 MG PO TABS: 3.1250 mg | ORAL_TABLET | Freq: Two times a day (BID) | ORAL | 0 refills | Status: DC

## 2018-08-21 MED ORDER — OXYCODONE HCL 5 MG PO TABS: 5.0000 mg | ORAL_TABLET | Freq: Four times a day (QID) | ORAL | 0 refills | Status: DC | PRN

## 2018-08-21 MED ORDER — CHOLECALCIFEROL 25 MCG (1000 UT) PO CAPS: 1000.0000 [IU] | ORAL_CAPSULE | Freq: Every day | ORAL | 0 refills | Status: DC

## 2018-08-21 MED ORDER — ESOMEPRAZOLE MAGNESIUM 40 MG PO CPDR: 40.0000 mg | DELAYED_RELEASE_CAPSULE | Freq: Every day | ORAL | 0 refills | Status: DC

## 2018-08-21 MED ORDER — ATORVASTATIN CALCIUM 20 MG PO TABS: 20.0000 mg | ORAL_TABLET | Freq: Every day | ORAL | 3 refills | Status: DC

## 2018-08-21 MED ORDER — APIXABAN 2.5 MG PO TABS: 2.5000 mg | ORAL_TABLET | Freq: Two times a day (BID) | ORAL | 0 refills | Status: DC

## 2018-08-21 MED ORDER — INSULIN ASPART 100 UNIT/ML ~~LOC~~ SOLN
7.0000 [IU] | Freq: Three times a day (TID) | SUBCUTANEOUS | Status: DC
  Administered 2018-08-21 – 2018-08-23 (×6): 7 [IU] via SUBCUTANEOUS

## 2018-08-21 MED ORDER — INSULIN NPH (HUMAN) (ISOPHANE) 100 UNIT/ML ~~LOC~~ SUSP: 5.0000 [IU] | Freq: Two times a day (BID) | SUBCUTANEOUS | 11 refills | Status: DC

## 2018-08-21 MED ORDER — ACETAMINOPHEN 325 MG PO TABS: 650.0000 mg | ORAL_TABLET | Freq: Four times a day (QID) | ORAL | Status: DC | PRN

## 2018-08-21 MED ORDER — INSULIN ASPART 100 UNIT/ML FLEXPEN: 7.0000 [IU] | PEN_INJECTOR | Freq: Three times a day (TID) | SUBCUTANEOUS | 11 refills | Status: DC

## 2018-08-21 NOTE — Progress Notes (Signed)
[facility] PHYSICAL MEDICINE & REHABILITATION PROGRESS NOTE  Subjective/Complaints: Patient seen sitting up at the edge of the bed.  Good sitting balance noted.  She states she slept well overnight.  She has the same questions regarding her C. difficile infection.  She also has questions regarding masking at discharge.  ROS: Denies CP, shortness of breath, nausea, vomiting.  Objective: Vital Signs: Blood pressure 129/79, pulse 89, temperature 98.5 F (36.9 C), temperature source Oral, resp. rate 17, height 5\' 1"  (1.549 m), weight 58 kg, SpO2 99 %. No results found. Recent Labs    08/19/18 0542 08/20/18 0423  WBC 4.9 5.0  HGB 8.6* 8.4*  HCT 27.4* 27.5*  PLT 164 PLATELET CLUMPS NOTED ON SMEAR, COUNT APPEARS ADEQUATE   Recent Labs    08/19/18 0542  NA 143  K 4.0  CL 113*  CO2 22  GLUCOSE 147*  BUN 28*  CREATININE 1.19*  CALCIUM 7.8*    Physical Exam: BP 129/79 (BP Location: Right Arm)   Pulse 89   Temp 98.5 F (36.9 C) (Oral)   Resp 17   Ht 5\' 1"  (1.549 m)   Wt 58 kg   SpO2 99%   BMI 24.16 kg/m  Constitutional: No distress . Vital signs reviewed. HENT: Normocephalic.  Atraumatic. Eyes: EOMI.  No discharge. Cardiovascular: No JVD. Respiratory: Normal effort. GI: Non-distended. Musc: No edema or tenderness in extremities. Musc: Joint deformities due to RA Neurological: She is alert.  Follows commands.   HOH Motor: Right upper extremity: 5/5 proximal distal, right lower extremity: 4-4+/5 proximal distal.  Left upper extremity: 4-4+/5 proximal distal  Left lower extremity: 4--4/5 proximal to distal Skin: Skin is warm and dry.  Psychiatric: pleasant   Assessment/Plan: 1. Functional deficits secondary to debility which require 3+ hours per day of interdisciplinary therapy in a comprehensive inpatient rehab setting.  Physiatrist is providing close team supervision and 24 hour management of active medical problems listed below.  Physiatrist and rehab team  continue to assess barriers to discharge/monitor patient progress toward functional and medical goals  Care Tool:  Bathing    Body parts bathed by patient: Right arm, Left arm, Chest, Abdomen, Right upper leg, Left upper leg, Face, Front perineal area, Buttocks, Right lower leg, Left lower leg   Body parts bathed by helper: Buttocks, Front perineal area, Right lower leg, Left lower leg     Bathing assist Assist Level: Supervision/Verbal cueing     Upper Body Dressing/Undressing Upper body dressing   What is the patient wearing?: Bra, Pull over shirt    Upper body assist Assist Level: Supervision/Verbal cueing    Lower Body Dressing/Undressing Lower body dressing      What is the patient wearing?: Pants, Incontinence brief     Lower body assist Assist for lower body dressing: Supervision/Verbal cueing     Toileting Toileting    Toileting assist Assist for toileting: Set up assist     Transfers Chair/bed transfer  Transfers assist     Chair/bed transfer assist level: Contact Guard/Touching assist     Locomotion Ambulation   Ambulation assist      Assist level: Contact Guard/Touching assist Assistive device: Walker-rolling Max distance: 23ft   Walk 10 feet activity   Assist  Walk 10 feet activity did not occur: Safety/medical concerns(Per PT)  Assist level: Contact Guard/Touching assist Assistive device: Walker-rolling   Walk 50 feet activity   Assist Walk 50 feet with 2 turns activity did not occur: Safety/medical concerns(Per PT)  Assist  level: Contact Guard/Touching assist Assistive device: Walker-rolling    Walk 150 feet activity   Assist Walk 150 feet activity did not occur: Safety/medical concerns(Per PT)  Assist level: Contact Guard/Touching assist Assistive device: Walker-rolling    Walk 10 feet on uneven surface  activity   Assist Walk 10 feet on uneven surfaces activity did not occur: Safety/medical concerns(Per  PT)   Assist level: Minimal Assistance - Patient > 75% Assistive device: Aeronautical engineer Will patient use wheelchair at discharge?: No Type of Wheelchair: Manual    Wheelchair assist level: Supervision/Verbal cueing Max wheelchair distance: 50'    Wheelchair 50 feet with 2 turns activity    Assist    Wheelchair 50 feet with 2 turns activity did not occur: Safety/medical concerns(Per PT)   Assist Level: Supervision/Verbal cueing   Wheelchair 150 feet activity     Assist Wheelchair 150 feet activity did not occur: Safety/medical concerns(Per PT)   Assist Level: Supervision/Verbal cueing      Medical Problem List and Plan: 1.  Debility secondary to septic shock due to C. difficile colitis. Contact precautions  Continue CIR 2.  Antithrombotics: -DVT/anticoagulation: Eliquis             -antiplatelet therapy: N/A 3. Pain Management: Oxycodone as needed  -appropriate shoe wear to accommodate right great toe 4. Mood: Provide emotional support             -antipsychotic agents: N/A 5. Neuropsych: This patient is capable of making decisions on her own behalf. 6. Skin/Wound Care: Routine skin checks 7. Fluids/Electrolytes/Nutrition: Routine in and outs  Megace ordered on 6/9 due to poor appetite, variable intake on 6/18, she states she does not eat that much at baseline. 8.  New onset PAF.  Continue Eliquis.    Lopressor 12.5 mg twice daily, changed to Coreg on 6/8 due to hypotension, DC'd on 6/9 due to hypotension/bradycardia             Coreg 3.25 restarted on 6/15 due to elevated heart rate and blood pressure Vitals:   08/20/18 2020 08/21/18 0425  BP: (!) 114/48 129/79  Pulse: 87 89  Resp: 18 17  Temp: 98.2 F (36.8 C) 98.5 F (36.9 C)  SpO2: 98% 99%   Labile, but relatively controlled on 6/18 9.    Steroid-induced hyperglycemia on diabetes mellitus.  Hemoglobin A1c 8.8.  Sliding scale insulin.  NovoLog 3 3 times daily started on  6/8, increased to 5 3 times daily on 6/16, increased to 7 3 times daily on 6/18  Insulin NPH 5 units twice daily started on 6/12 CBG (last 3)  Recent Labs    08/20/18 1735 08/20/18 2123 08/21/18 0640  GLUCAP 202* 177* 142*    Labile on 6/18  Monitor with increased mobility 10.  Rheumatoid arthritis.  Patient on Enbrel 50 mg injection weekly and can be resumed as outpatient.  Continue chronic prednisone 5 mg daily 11.  AKI vs CKD.    Appreciate nephro recs, adjusting diuretics             Creatinine 1.19 on 6/16, labs ordered for tomorrow  Encourage fluids  Continue to monitor 12.  Hypoalbuminemia  Supplement initiated on 6/4 13.  Leukocytosis: Resolved-likely steroid-induced +/- C. difficile  Continue to monitor 14. Acute blood loss anemia  Hemoglobin 8.4 on 6/17, labs ordered for tomorrow  1 of 2 stools OB+  Continue to monitor 15.  Lumbar back pain  Secondary degenerative disc  disease as well as facet arthropathy of lumbar spine  L-spine x-ray reviewed suggesting degenerative changes  Controlled on 6/8 16.  Hypokalemia  Potassium 4.0 on 6/16, labs ordered for tomorrow  Supplement initiated on 6/15 17.  Hypophosphatemia  Supplemented x1 dose on 6/8 18.  Shortness of breath: Resolved  Chest x-ray from 5/31 reviewed, showing bilateral small pleural effusions and atelectasis  Follow-up x-ray stable 19. Diarrhea: Improving  Vanc DC'd after discussion with ID.  C. Diff negative on 6/15 20.  Thrombocytopenia  Platelets 164 on 6/16, clumps on 6/17, labs ordered for tomorrow  LOS: 15 days A FACE TO FACE EVALUATION WAS PERFORMED  Shelby Walker Lorie Phenix 08/21/2018, 9:08 AM

## 2018-08-21 NOTE — Progress Notes (Signed)
Occupational Therapy Session Note  Patient Details  Name: RAMI WADDLE MRN: [id number] Date of Birth: 13-Mar-1933  Today's Date: 08/21/2018 OT Individual Time: 5003-7048 OT Individual Time Calculation (min): 50 min    Short Term Goals: Week 2:  OT Short Term Goal 1 (Week 2): STGs = LTGs  Skilled Therapeutic Interventions/Progress Updates:    Treatment session with focus on family education. Pt received upright in w/c with daughter, [name], present for education.  Engaged in functional home mobility and transfers with focus on education with daughter about safety and recommended DME/AE.  Pt reports need to toilet.  Ambulated to bathroom with RW with distant supervision.  Pt incontinent of BM prior to toileting.  Pt able to complete all hygiene and transfer with supervision.  Discussed alternative incontinence briefs to promote increased independence with toileting.  Pt ambulated to ADL apt with RW with supervision.  Completed simulated shower transfer with stepping over 3" ledge with min cues initially faded to close supervision without cues.  Discussed DME recommendation, daughter reports that she is going to look and purchase a shower seat today.  Pt's daughter reports bed in 27" high, completed transfer in/out of bed with supervision.  Educated on current goal level and recommendations for supervision.  Pt's daughter reports plan to stay with pt and her husband initially upon d/c to ensure transition. Pt passed off to PT.  Therapy Documentation Precautions:  Precautions Precautions: Fall Precaution Comments: left knee buckles Restrictions Weight Bearing Restrictions: No Pain:  Pt with no c/o pain   Therapy/Group: Individual Therapy  Simonne Come 08/21/2018, 1:05 PM

## 2018-08-21 NOTE — Discharge Summary (Signed)
Physician Discharge Summary  Patient ID: Shelby Walker MRN: [id number] DOB/AGE: [birth year] 85 y.o.  Admit date: 08/06/2018 Discharge date: 08/23/2018  Discharge Diagnoses:  Active Problems:   Debility   AKI (acute kidney injury) ([place])   Diabetes mellitus type 2 in nonobese [facility])   New onset atrial fibrillation (HCC)   Acute blood loss anemia   Leukocytosis   Hypoalbuminemia due to protein-calorie malnutrition (HCC)   Pain   Hypokalemia   Steroid-induced hyperglycemia   Shortness of breath   Hypophosphatemia   Chest discomfort   Bradycardia   Hypotension due to drugs   Thrombocytopenia (HCC)   Diarrhea of presumed infectious origin   Essential hypertension   Discharged Condition: Stable  Significant Diagnostic Studies: Dg Chest 2 View  Result Date: 08/11/2018 CLINICAL DATA:  Weakness EXAM: CHEST - 2 VIEW COMPARISON:  08/03/2018 FINDINGS: Cardiac shadow is stable. Aortic calcifications are noted. The right lung is clear. Small left pleural effusion is noted similar to that seen on the prior exam. No acute bony abnormality is noted. IMPRESSION: Small left pleural effusion stable from the prior exam. Electronically Signed   By: Inez Catalina M.D.   On: 08/11/2018 14:20   Dg Lumbar Spine 2-3 Views  Result Date: 08/07/2018 CLINICAL DATA:  Pain EXAM: LUMBAR SPINE - 2-3 VIEW COMPARISON:  CT abdomen and pelvis 07/24/2018 FINDINGS: Osseous demineralization. Five non-rib-bearing lumbar vertebra. Multilevel disc space narrowing and endplate spur formation, greatest at L3-L4 and T11-T12. Facet degenerative changes of the lower lumbar spine. Grade 1 anterolisthesis at L4-L5 likely due to degenerative disc and facet disease. Vertebral body heights maintained without fracture, additional subluxation, or bone destruction. No spondylolysis. Atherosclerotic calcifications aorta. SI joints preserved. IMPRESSION: Degenerative disc and facet disease changes of the lumbar spine as above. Electronically Signed    By: Lavonia Dana M.D.   On: 08/07/2018 14:56   Dg Abd 1 View  Result Date: 07/26/2018 CLINICAL DATA:  Hypotension EXAM: ABDOMEN - 1 VIEW COMPARISON:  CT abdomen and pelvis Jul 24, 2018 FINDINGS: There is no appreciable bowel dilatation or air-fluid level to suggest bowel obstruction. No free air. Vascular calcification noted in the pelvis. IMPRESSION: No demonstrable bowel obstruction or free air. Electronically Signed   By: Lowella Grip III M.D.   On: 07/26/2018 10:23   Ct Abdomen Pelvis W Contrast  Result Date: 07/24/2018 CLINICAL DATA:  Fevers abdominal pain EXAM: CT ABDOMEN AND PELVIS WITH CONTRAST TECHNIQUE: Multidetector CT imaging of the abdomen and pelvis was performed using the standard protocol following bolus administration of intravenous contrast. CONTRAST:  159mL OMNIPAQUE IOHEXOL 300 MG/ML  SOLN COMPARISON:  07/09/2018 FINDINGS: Lower chest: No acute abnormality. Hepatobiliary: No focal liver abnormality is seen. No gallstones, gallbladder wall thickening, or biliary dilatation. Pancreas: Unremarkable. No pancreatic ductal dilatation or surrounding inflammatory changes. Spleen: Normal in size without focal abnormality. Adrenals/Urinary Tract: Adrenal glands are within normal limits bilaterally. Kidneys demonstrate no renal calculi or urinary tract obstructive changes. Stable renal cysts are noted bilaterally. Bladder is well distended. Stomach/Bowel: Colon demonstrates diffuse diverticular change throughout the sigmoid colon. Mucosal hyperemia and mild pericolonic inflammatory changes are now seen diffusely throughout the colon increased from the prior exam consistent with a more generalized likely infectious colitis. Small bowel and stomach are within normal limits with the exception of a small sliding-type hiatal hernia. No free air is seen. Vascular/Lymphatic: Aortic atherosclerosis. No enlarged abdominal or pelvic lymph nodes. Reproductive: Uterus and bilateral adnexa are  unremarkable. Other: No abdominal wall hernia  or abnormality. No abdominopelvic ascites. Musculoskeletal: Degenerative changes of lumbar spine are noted. IMPRESSION: Changes consistent with diffuse colitis without perforation or abscess formation. This has advanced significantly when compared with the prior exam. Electronically Signed   By: Inez Catalina M.D.   On: 07/24/2018 15:29   US Renal  Result Date: 07/30/2018 CLINICAL DATA:  Renal failure. EXAM: RENAL / URINARY TRACT ULTRASOUND COMPLETE COMPARISON:  CT dated 07/24/2018 FINDINGS: Right Kidney: Renal measurements: 10.8 x 4.3 x 4.5 cm = volume: 111 mL . Echogenicity within normal limits. No mass or hydronephrosis visualized. A 1.9 cm cyst is noted Left Kidney: Renal measurements: 11 x 5.6 x 4.6 cm = volume: 149.9 mL. Echogenicity within normal limits. No mass or hydronephrosis visualized. A 1.3 cm cyst is noted. Bladder: Appears normal for degree of bladder distention. New mild-to-moderate abdominal ascites. IMPRESSION: 1. No acute sonographic abnormality detected. There is no hydronephrosis. 2. New mild-to-moderate abdominal ascites. Electronically Signed   By: Constance Holster M.D.   On: 07/30/2018 15:19   Dg Chest Port 1 View  Result Date: 08/03/2018 CLINICAL DATA:  Dyspnea EXAM: PORTABLE CHEST 1 VIEW COMPARISON:  07/29/2018 chest radiograph. FINDINGS: Stable cardiomediastinal silhouette with normal heart size. No pneumothorax. Small left pleural effusion, stable. Trace right pleural effusion, stable. No pulmonary edema. Left basilar lung opacity, similar. IMPRESSION: 1. Stable small left and trace right pleural effusions. 2. Stable left basilar lung opacity, favor atelectasis. Electronically Signed   By: Ilona Sorrel M.D.   On: 08/03/2018 15:13   Dg Chest Port 1 View  Result Date: 07/29/2018 CLINICAL DATA:  Cough and vomiting EXAM: PORTABLE CHEST 1 VIEW COMPARISON:  07/29/2018 FINDINGS: Cardiac shadow is stable. Left jugular central line is  again noted at the cavoatrial junction. The lungs are well aerated bilaterally. Small pleural effusions are seen bilaterally with left basilar atelectasis. No other focal abnormality is noted. IMPRESSION: Small effusions with left basilar atelectasis. No new focal abnormality is noted. Electronically Signed   By: Inez Catalina M.D.   On: 07/29/2018 19:32   Dg Chest Port 1 View  Result Date: 07/29/2018 CLINICAL DATA:  Cough.  Respiratory failure. EXAM: PORTABLE CHEST 1 VIEW COMPARISON:  Chest x-ray dated 07/26/2018 FINDINGS: The heart size is enlarged. There is a growing left-sided pleural effusion. The left IJ catheter is well position. No pneumothorax. There is an increasing retrocardiac opacity. There is no acute osseous abnormality. IMPRESSION: 1. Increasing left basilar opacity which may represent a combination of a small left-sided pleural effusion and/or atelectasis/infiltrate. 2. Mild cardiac enlargement. 3. Stable positioning of the left IJ line. Electronically Signed   By: Constance Holster M.D.   On: 07/29/2018 02:43   Dg Chest Port 1 View  Result Date: 07/26/2018 CLINICAL DATA:  Central line placement. EXAM: PORTABLE CHEST 1 VIEW COMPARISON:  07/26/2018 at 0958 hours FINDINGS: A left jugular catheter has been placed and terminates near the superior cavoatrial junction. The cardiomediastinal silhouette is unchanged with normal heart size. No airspace consolidation, edema, sizable pleural effusion, or pneumothorax is identified. An old right rib fracture is noted. IMPRESSION: Left jugular catheter placement as above. No evidence of pneumothorax or acute cardiopulmonary process. Electronically Signed   By: Logan Bores M.D.   On: 07/26/2018 13:20   Dg Chest Port 1 View  Result Date: 07/26/2018 CLINICAL DATA:  Hypotension EXAM: PORTABLE CHEST 1 VIEW COMPARISON:  Jul 24, 2018 FINDINGS: No edema or consolidation. Heart size and pulmonary vascularity are normal. No adenopathy. No bone lesions.  IMPRESSION: No edema or consolidation.  Stable cardiac silhouette. Electronically Signed   By: Lowella Grip III M.D.   On: 07/26/2018 10:22   Dg Chest Portable 1 View  Result Date: 07/24/2018 CLINICAL DATA:  Chest pain EXAM: PORTABLE CHEST 1 VIEW COMPARISON:  01/20/2007 FINDINGS: Cardiac shadow is within normal limits. The lungs are well aerated bilaterally. No focal infiltrate or sizable effusion is seen. No acute bony abnormality is noted. IMPRESSION: No active disease. Electronically Signed   By: Inez Catalina M.D.   On: 07/24/2018 11:52   Dg Hip Unilat W Or Wo Pelvis 2-3 Views Left  Result Date: 07/24/2018 CLINICAL DATA:  Left hip pain after fall 4 days ago. EXAM: DG HIP (WITH OR WITHOUT PELVIS) 2-3V LEFT COMPARISON:  None. FINDINGS: There is no evidence of hip fracture or dislocation. There is no evidence of arthropathy or other focal bone abnormality. IMPRESSION: Negative. Electronically Signed   By: Marijo Conception M.D.   On: 07/24/2018 11:55   [place] Ekg Site Rite  Result Date: 07/26/2018 If Site Rite image not attached, placement could not be confirmed due to current cardiac rhythm.  US Abdomen Limited Ruq  Result Date: 08/02/2018 CLINICAL DATA:  Anasarca EXAM: ULTRASOUND ABDOMEN LIMITED RIGHT UPPER QUADRANT COMPARISON:  CT 07/24/2018 FINDINGS: Gallbladder: Gallbladder is distended. Sludge seen within the gallbladder. There Is mild gallbladder wall thickening, 3-4 mm. No visible stones. Common bile duct: Diameter: Normal caliber, 6 mm Liver: No focal lesion identified. Within normal limits in parenchymal echogenicity. Portal vein is patent on color Doppler imaging with normal direction of blood flow towards the liver. Perihepatic ascites and ascites noted around the gallbladder. IMPRESSION: Distended gallbladder with sludge and mild gallbladder wall thickening. Consider further evaluation with nuclear medicine hepatobiliary scan if there is concern of cystic duct patency. Perihepatic  ascites. Electronically Signed   By: Rolm Baptise M.D.   On: 08/02/2018 17:04    Labs:  Basic Metabolic Panel: Recent Labs  Lab 08/18/18 0609 08/19/18 0542  NA 141 143  K 2.9* 4.0  CL 109 113*  CO2 23 22  GLUCOSE 151* 147*  BUN 30* 28*  CREATININE 1.08* 1.19*  CALCIUM 7.8* 7.8*    CBC: Recent Labs  Lab 08/18/18 0609 08/19/18 0542 08/20/18 0423  WBC 6.7 4.9 5.0  NEUTROABS  --  2.6 2.5  HGB 8.3* 8.6* 8.4*  HCT 26.0* 27.4* 27.5*  MCV 92.2 91.9 93.5  PLT 131* 164 PLATELET CLUMPS NOTED ON SMEAR, COUNT APPEARS ADEQUATE    CBG: Recent Labs  Lab 08/20/18 2123 08/21/18 0640 08/21/18 1201 08/21/18 1717 08/21/18 2119  GLUCAP 177* 142* 114* 170* 130*   Family history.  Father with congestive heart failure, myocardial infarction, diabetes mellitus.  Mother with CAD, heart failure, diabetes.  Sister with CVA.  Denies any colon cancer  Brief HPI: Shelby Walker is an 85 year old right-handed female with history of diabetes mellitus, hypertension, rheumatoid arthritis maintained on Enbrel as well as prednisone.  Per chart review she lives with her daughter and son-in-law.  Independent prior to admission.  2 level home.  Presented 07/24/2018 with nausea, vomiting and diarrhea.  She was recently seen in urgent care diagnosed with infectious colitis completed a 7-day course of Cipro and Flagyl without improvement.  She was noted to have SIRS on evaluation in the ED.  Noted fever of 100.5, heart rate 104, WBC 15,300, BUN 23, creatinine 1.22.  Urinalysis negative nitrite, COVID negative, troponin negative.  Chest x-ray unremarkable.  CT  abdomen pelvis noted diffuse colitis without perforation or abscess.  C. difficile specimen was positive maintained on vancomycin with contact precautions.  Hospital course complicated by PAF placed on amiodarone converted back to sinus rhythm.  Echocardiogram with ejection fraction of 65%.  Eliquis was initiated for PAF as well as Lopressor.  Renal services  consulted 07/30/2018 for elevated creatinine 3.98 from baseline 1.22.  Renal ultrasound showed no hydronephrosis.  There was some new mild to moderate abdominal ascites.  Patient responded well to Lasix therapy latest creatinine 1.91.  Therapy evaluations completed and patient was admitted for a comprehensive rehab program  Hospital Course: Shelby Walker was admitted to rehab 08/06/2018 for inpatient therapies to consist of PT, ST and OT at least three hours five days a week. Past admission physiatrist, therapy team and rehab RN have worked together to provide customized collaborative inpatient rehab.  Pertaining to patient debility related to septic shock due to C. difficile colitis.  Contact precautions as noted.  She still had some loose stools C. difficile specimen was repeated that was negative initially resumed back on vancomycin discontinued after slate a specimen negative.  Imodium was added for her stooling.  She remained on Eliquis for new onset PAF cardiac rate controlled no chest pain or increasing shortness of breath.  Blood pressures monitored currently on Coreg 3.125 mg twice daily.  She had been on Lopressor initially but discontinued due to some hypotension and bradycardia.  Blood sugars initially had some variable hemoglobin A1c 8.8 patient on chronic prednisone for rheumatoid arthritis.  Insulin therapy as directed and she would follow-up with her primary MD.  AKI latest creatinine 1.19.  Acute blood loss anemia latest hemoglobin 8.4.  Lumbar back pain x-rays revealed degenerative changes conservative care provided.  Decreased nutritional storage dietary services consulted for food preferences.  Physical exam.  Blood pressure 120/45 pulse 72 temperature 98.1 respirations 18 oxygen saturations 95% room air Constitutional.  Patient no acute distress HEENT Head.  Normocephalic and atraumatic Eyes.  No discharge without nystagmus patient prefers to keep her eyes closed Respiratory effort normal no  respiratory distress without wheezes GI.  Exhibits no distention nontender without rebound positive bowel sounds Musculoskeletal.  No edema or tenderness in extremities ulnar deviation and MCP joints bilaterally Neurological.  Alert no acute distress follows commands she is a bit hard of hearing.  Right upper extremity 5 out of 5 proximal distal right lower extremity 4- 4+ out of 5 proximal distal left upper extremity 4 out of 5 proximal distal left lower extremity 2+ out of 5 proximal to distal  Rehab course: During patient's stay in rehab weekly team conferences were held to monitor patient's progress, set goals and discuss barriers to discharge. At admission, patient required moderate assist to max assist ambulate 8 feet rolling walker, moderate assist stand pivot transfers, max assist supine to sit, max assist sit to supine  She  has had improvement in activity tolerance, balance, postural control as well as ability to compensate for deficits. She has had improvement in functional use RUE/LUE  and RLE/LLE as well as improvement in awareness.  Working with energy conservation techniques.  Sessions focused on functional dynamic balance and ambulation in the room with rolling walker to simulate kitchen tasks or gathering clothing out of low drawer shells and how to manage items with rolling walker.  Patient able to functionally ambulate in room with rolling walker close supervision level while performing simulated household tasks.  She can gather simple belongings for  activities daily living and homemaking dressing grooming and hygiene.  Family teaching completed and plan discharge to home       Disposition: Discharge disposition: 01-Home or Self Care      Discharge to home   Diet: Diabetic diet  Special Instructions: No driving smoking or alcohol  Medications at discharge. 1 Tylenol as needed 2.  Eliquis 2.5 mg p.o. twice daily 3.  Lipitor 20 mg p.o. daily 4.  Coreg 3.125 mg p.o. twice  daily 5.  NovoLog 5 units 3 times daily with meals 6.  Insulin NPH 5 units twice daily at breakfast and bedtime 7.  Imodium as needed 8.  Multivitamin daily 9.  Nitroglycerin as needed 10.  Oxycodone 5 mg every 6 hours as needed pain 11.  Protonix 40 mg daily 12.  Prednisone 5 mg daily 13.  Enbrel 50 mg injection into the skin weekly  Discharge Instructions    Ambulatory referral to Physical Medicine Rehab   Complete by: As directed    Moderate complexity follow-up 1 month debilitation related to C. difficile      Follow-up Information    Jamse Arn, MD Follow up.   Specialty: Physical Medicine and Rehabilitation Why: As directed Contact information: [address] [place] [address] [phone]        Roney Jaffe, MD Follow up.   Specialty: Nephrology Why: As needed Contact information: [place] [address] [phone]        Reynold Bowen, MD Follow up.   Specialty: Endocrinology Why: They will call you with a post hospital follow up appointment.  Sonia Baller requested that is be the first appointment after lunch with Dr. Forde Dandy. Contact information: [place] [address] [phone]        Nahser, Wonda Cheng, MD .   Specialty: Cardiology Contact information: [address] [place] [address] [phone]           Signed: Lavon Paganini [place] 08/22/2018, 6:05 AM

## 2018-08-21 NOTE — Progress Notes (Signed)
Physical Therapy Session Note  Patient Details  Name: Shelby Walker MRN: [id number] Date of Birth: [birth year]  Today's Date: 08/21/2018 PT Individual Time: 7703-4035 PT Individual Time Calculation (min): 45 min   Short Term Goals: Week 2:  PT Short Term Goal 1 (Week 2): Patient to ambulate 2' with RW and CGA PT Short Term Goal 2 (Week 2): Patient to demonstrate standing balance with CGA during functional task. PT Short Term Goal 3 (Week 2): Patient to negotiate 4 steps wtih rails and CGA  Skilled Therapeutic Interventions/Progress Updates:    Pt received standing at sink with RN in room providing AM medications. Pt agreeable to therapy session, no complaints of pain. Pt transfers with Supervision with use of RW throughout session. Ambulation 2 x 200 ft with RW and Supervision. Session focus on static and dynamic standing balance tasks: alt L/R 6" step taps with min HHA with one UE x 10 reps each; alt L/R cone taps with RW and min A for balance; sidesteps L/R 4 x 10 ft each direction with BUE min HHA. Education with patient about energy conservation and taking breaks throughout the day between tasks. Pt left seated in w/c in room with needs in reach, quick release belt and chair alarm in place at end of session.  Therapy Documentation Precautions:  Precautions Precautions: Fall Precaution Comments: left knee buckles Restrictions Weight Bearing Restrictions: No    Therapy/Group: Individual Therapy   Excell Seltzer, PT, DPT  08/21/2018, 9:26 AM

## 2018-08-21 NOTE — Progress Notes (Signed)
Physical Therapy Session Note  Patient Details  Name: Shelby Walker MRN: [id number] Date of Birth: [birth year]  Today's Date: 08/21/2018 PT Individual Time: 7893-8101 PT Individual Time Calculation (min): 63 min   Short Term Goals: Week 2:  PT Short Term Goal 1 (Week 2): Patient to ambulate 21' with RW and CGA PT Short Term Goal 2 (Week 2): Patient to demonstrate standing balance with CGA during functional task. PT Short Term Goal 3 (Week 2): Patient to negotiate 4 steps wtih rails and CGA  Skilled Therapeutic Interventions/Progress Updates:    Pt received sitting on ADL apartment bed with daughter, [name], and OT, Judson Roch, present for hand-off. Patient's daughter, [name], present for family education and hands-on training. Pt performed sit<>stands from various surfaces using RW with supervision for safety throughout session. Pt ambulated ~123ft using RW with supervision to main therapy gym. Pt and pt's daughter confirm that pt's daughter will be providing initial 24hr support at discharge as pt's husband is unable to provide physical assistance to pt if needed. Pt's daughter also plans on continuing to do pt's laundry as her washer/dryer is in the basement of her home - pt educated not to go down to basement. Pt's daughter reports that her brother (pt's son) is planning on installing a grab bar on the L side of the stairs today.Pt ascended/descended 5 steps using L HR to replication home environment with therapist providing hands-on min assist primarily for lifting due to pt continuing to demonstrate intermittent R knee buckling when stepping up that increases with fatigue - extensive pt/family caregiver education/training on proper set-up, LE sequencing, and positioning for family to provide assist. Pt ascended/descended 8 steps using L HR with pt's daughter providing min assist and therapist initially providing CGA with mod cuing progressed to supervision with min cuing on proper technique. Pt  ascended/descended 2 steps with alternate hand grip on L HR to better replicate grab bar with therapist providing min assist primarily for lifting and balance - pt noted to have no increased difficulty performing task this way compared to other hand position. Ambulated ~248ft using RW to car room with supervision for safety - pt demonstrates good safety awareness and good AD management while ambulating throughout session. Pt/daughter educated on decreasing fall risk in the home (i.e. footwear, rugs, need to use RW, etc.) Pt's daughter reports that they have a SUV and a sedan - attempted car transfer with SUV height but pt required min assist for balance and lifting into vehicle with decreased safety. Performed sedan height ambulatory car transfer using RW with close supervision for pt safety and pt's daughter demonstrated proper positioning and good awareness of the pt's movements. Ambulated ~13ft using RW back to room with supervision for safety.  Patient participated in the following exercises - provided patient a printout of HEP at end of session and educated on performing this at home: - repeated sit<>stand with UE support on thighs 2 x 6 repetitions - seated LLE LAQs x15 repetitions with cuing to maintain muscle contraction throughout movement - standing marches with B UE support on RW x10 repetitions - standing hamstring curls x10 each LE with B UE support on RW - cuing to avoid compensation of movement at hip - standing heel raises with B UE support on RW - pt reports discomfort if raising too high on toes - cuing to limit ROM - supine bridging x10 repetitions with cuing for proper technique to increase gluteal activation Pt able to perform supine<>sit, using bedrails, with supervision.  Pt/daughter educated on proper set-up for exercises at home for increased safety as well as education to stop exercises in event of onset of pain.  Stand pivot transfer EOB<>w/c using RW with close supervision.  Patient/daughter report no further questions/concerns and they demonstrated good understanding of education throughout session. Pt left sitting in w/c with needs in reach, pt's daughter still present, and seat belt alarm on.  Therapy Documentation Precautions:  Precautions Precautions: Fall Precaution Comments: left knee buckles Restrictions Weight Bearing Restrictions: No  Pain:   No reports of pain during session.   Therapy/Group: Individual Therapy  Tawana Scale, PT, DPT 08/21/2018, 7:57 AM

## 2018-08-21 NOTE — Progress Notes (Signed)
Occupational Therapy Session Note  Patient Details  Name: Shelby Walker MRN: [id number] Date of Birth: [birth year]  Today's Date: 08/21/2018 OT Individual Time: 1330-1425 OT Individual Time Calculation (min): 55 min    Short Term Goals: Week 2:  OT Short Term Goal 1 (Week 2): STGs = LTGs  Skilled Therapeutic Interventions/Progress Updates:    Treatment session with focus on functional mobility in home environment, education on energy conservation, and adaptive techniques to increase independence with self-care and home making tasks.  Pt received upright in w/c agreeable to therapy.  Educated pt on technique for donning/doffing TEDS.  Pt able to complete with increased time with use of plastic foot piece.  Pt ambulated to toilet with distant supervision, completed toileting tasks without assistance.  Ambulated to ADL apt with RW with distant supervision.  Pt completed functional mobility in kitchen with focus on RW safety when opening refrigerator and cabinets, educating on use of counter tops to transport items.  Discussed modifications of tasks to allow for participation in home making tasks, while allowing family to assist with larger/more taxing aspects of task.  Pt in agreement.  Returned to room and left upright in w/c with seat belt alarm on and all needs in reach.  Therapy Documentation Precautions:  Precautions Precautions: Fall Precaution Comments: left knee buckles Restrictions Weight Bearing Restrictions: No Pain:  Pt with no c/o pain   Therapy/Group: Individual Therapy  Simonne Come 08/21/2018, 3:11 PM

## 2018-08-22 ENCOUNTER — Inpatient Hospital Stay (HOSPITAL_COMMUNITY): Payer: Medicare Other | Admitting: Physical Therapy

## 2018-08-22 ENCOUNTER — Ambulatory Visit (HOSPITAL_COMMUNITY): Payer: Medicare Other | Admitting: Physical Therapy

## 2018-08-22 ENCOUNTER — Inpatient Hospital Stay (HOSPITAL_COMMUNITY): Payer: Medicare Other | Admitting: Occupational Therapy

## 2018-08-22 LAB — CBC WITH DIFFERENTIAL/PLATELET
Abs Immature Granulocytes: 0.03 10*3/uL (ref 0.00–0.07)
Basophils Absolute: 0 10*3/uL (ref 0.0–0.1)
Basophils Relative: 1 %
Eosinophils Absolute: 0.3 10*3/uL (ref 0.0–0.5)
Eosinophils Relative: 5 %
HCT: 26.9 % — ABNORMAL LOW (ref 36.0–46.0)
Hemoglobin: 8.4 g/dL — ABNORMAL LOW (ref 12.0–15.0)
Immature Granulocytes: 1 %
Lymphocytes Relative: 33 %
Lymphs Abs: 1.9 10*3/uL (ref 0.7–4.0)
MCH: 29.1 pg (ref 26.0–34.0)
MCHC: 31.2 g/dL (ref 30.0–36.0)
MCV: 93.1 fL (ref 80.0–100.0)
Monocytes Absolute: 0.6 10*3/uL (ref 0.1–1.0)
Monocytes Relative: 11 %
Neutro Abs: 2.9 10*3/uL (ref 1.7–7.7)
Neutrophils Relative %: 49 %
Platelets: 247 10*3/uL (ref 150–400)
RBC: 2.89 MIL/uL — ABNORMAL LOW (ref 3.87–5.11)
RDW: 17.3 % — ABNORMAL HIGH (ref 11.5–15.5)
WBC: 5.8 10*3/uL (ref 4.0–10.5)
nRBC: 0 % (ref 0.0–0.2)

## 2018-08-22 LAB — BASIC METABOLIC PANEL
Anion gap: 6 (ref 5–15)
BUN: 36 mg/dL — ABNORMAL HIGH (ref 8–23)
CO2: 22 mmol/L (ref 22–32)
Calcium: 8.3 mg/dL — ABNORMAL LOW (ref 8.9–10.3)
Chloride: 115 mmol/L — ABNORMAL HIGH (ref 98–111)
Creatinine, Ser: 1.12 mg/dL — ABNORMAL HIGH (ref 0.44–1.00)
GFR calc Af Amer: 53 mL/min — ABNORMAL LOW (ref >60)
GFR calc non Af Amer: 45 mL/min — ABNORMAL LOW (ref >60)
Glucose, Bld: 148 mg/dL — ABNORMAL HIGH (ref 70–99)
Potassium: 4.9 mmol/L (ref 3.5–5.1)
Sodium: 143 mmol/L (ref 135–145)

## 2018-08-22 LAB — GLUCOSE, CAPILLARY
Glucose-Capillary: 134 mg/dL — ABNORMAL HIGH (ref 70–99)
Glucose-Capillary: 96 mg/dL (ref 70–99)
Glucose-Capillary: 123 mg/dL — ABNORMAL HIGH (ref 70–99)
Glucose-Capillary: 133 mg/dL — ABNORMAL HIGH (ref 70–99)

## 2018-08-22 NOTE — Progress Notes (Signed)
Occupational Therapy Discharge Summary  Patient Details  Name: Shelby Walker MRN: [id number] Date of Birth: 1934/02/16   Patient has met 10 of 10 long term goals due to improved activity tolerance, improved balance, postural control and ability to compensate for deficits.  Patient to discharge at overall Supervision - Mod I level.  Patient's care partner is independent to provide the necessary intermittent supervision assistance at discharge.    Reasons goals not met: N/A  Recommendation:  Patient will benefit from ongoing skilled OT services in home health setting to continue to advance functional skills in the area of BADL and Reduce care partner burden.  Equipment: 3 in 1  Reasons for discharge: treatment goals met and discharge from hospital  Patient/family agrees with progress made and goals achieved: Yes  OT Discharge Precautions/Restrictions  Precautions Precautions: Fall General   Vital Signs Therapy Vitals Pulse Rate: 66 BP: (!) 143/62 Pain Pain Assessment Pain Scale: 0-10 Pain Score: 0-No pain ADL ADL Eating: Independent Where Assessed-Eating: Wheelchair Grooming: Modified independent Where Assessed-Grooming: Standing at sink Upper Body Bathing: Modified independent Where Assessed-Upper Body Bathing: Shower Lower Body Bathing: Supervision/safety Where Assessed-Lower Body Bathing: Shower Upper Body Dressing: Modified independent (Device) Where Assessed-Upper Body Dressing: Edge of bed Lower Body Dressing: Modified independent Where Assessed-Lower Body Dressing: Edge of bed Toileting: Modified independent Where Assessed-Toileting: Glass blower/designer: Distant supervision Armed forces technical officer Method: Counselling psychologist: Bedside commode, Other (comment)(over toilet) Social research officer, government: Close supervision Social research officer, government Method: Heritage manager: Civil engineer, contracting with back Vision Baseline Vision/History: Wears  glasses Wears Glasses: Reading only Patient Visual Report: No change from baseline Vision Assessment?: No apparent visual deficits Cognition Overall Cognitive Status: Within Functional Limits for tasks assessed Arousal/Alertness: Awake/alert Orientation Level: Oriented X4 Attention: Selective Selective Attention: Appears intact Memory: Appears intact Problem Solving: Appears intact Safety/Judgment: Appears intact Sensation Sensation Light Touch: Impaired by gross assessment(decreased on feet to Lt touch) Hot/Cold: Appears Intact Proprioception: Appears Intact Stereognosis: Appears Intact Coordination Fine Motor Movements are Fluid and Coordinated: Yes Extremity/Trunk Assessment RUE Assessment RUE Assessment: Within Functional Limits Passive Range of Motion (PROM) Comments: WFL Active Range of Motion (AROM) Comments: WFL. Arthritc joints in hand General Strength Comments: grossly 4/5, weak gross grasp LUE Assessment LUE Assessment: Within Functional Limits Passive Range of Motion (PROM) Comments: WFL Active Range of Motion (AROM) Comments: WFL. Arthritic joints in hand  General Strength Comments: grossly 4/5, weak gross grasp   Riata Ikeda, [facility] 08/22/2018, 11:32 AM

## 2018-08-22 NOTE — Progress Notes (Signed)
Social Work Discharge Note  The overall goal for the admission was met for:   Discharge location: Yes - home with husband and adult children  Length of Stay: Yes - 17 days  Discharge activity level: Yes - supervision  Home/community participation: Yes  Services provided included: MD, RD, PT, OT, RN, Pharmacy, Neuropsych and SW  Financial Services: Medicare and Private Insurance: [place]  Follow-up services arranged: Home Health: PT/OT, DME: youth rolling walker and 3-in-1 and Patient/Family request agency HH: [facility], DME: [employer]  Comments (or additional information):  Dtr and son came for family education and are prepared to support pt at home.  Patient/Family verbalized understanding of follow-up arrangements: Yes  Individual responsible for coordination of the follow-up plan: pt and her [place], [place] - [phone]  Confirmed correct DME delivered: Trey Sailors 08/22/2018    Scot Shiraishi, Silvestre Mesi

## 2018-08-22 NOTE — Progress Notes (Signed)
Physical Therapy Discharge Summary  Patient Details  Name: Shelby Walker MRN: [id number] Date of Birth: 10/25/1933  Today's Date: 08/22/2018 PT Individual Time: 1506-1559 PT Individual Time Calculation (min): 53 min    Patient has met 6 of 7 long term goals due to improved activity tolerance, improved balance, improved postural control, increased strength, decreased pain and improved attention.  Patient to discharge at an ambulatory level Supervision. Patient's daughter (Lisa) and son (Dean) attended in-person family education/training and are independent in providing the necessary physical assistance at discharge.  Reasons goals not met: Patient continues to require min assist for stair navigation due to impaired B LE (L>R) strength.  Recommendation:  Patient will benefit from ongoing skilled PT services in home health setting to continue to advance safe functional mobility, address ongoing impairments in B LE strength, standing balance, gait, stair navigation, and minimize fall risk.  Equipment: Youth height RW  Reasons for discharge: treatment goals met and discharge from hospital  Patient/family agrees with progress made and goals achieved: Yes  Skilled Therapeutic Interventions/Progress Updates:  Pt received sitting on EOB stating that her son will be here shortly for family education and pt agreeable to therapy session. When questioned about pain pt states "my back is a little sore is all" and RN notified for pain medication per pt request. Pt's son, Dean, arrived and educated on pt's current functional mobility level and he reports having grab bar installed at steps of pt's home earlier today. Pt performed sit<>stand using RW from various surfaces mod-I throughout therapy session. Ambulated ~150ft using RW to therapy gym mod-I demonstrating good safety awareness with AD and no LOB or unsteadiness. Pt/son educated on proper AD management prior to stair navigation as well as placing chair  at top/bottom of steps in event of pt fatigue if needed. Ascended/descended 2 steps using L HR to replicate grab bar at home with min assist - therapist providing extensive pt family education on proper positioning to provide assistance at home. Pt ascended/descended 4 steps using L HR x2 with pt's son providing min assist - 1st trial therapist providing CGA for safety with mod/max cuing for proper positioning of family, 2nd trial therapist providing distant supervision with min cuing and pt's son demonstrating understanding. Pt's son educated on pt's printed HEP program in room to continue addressing pt's B LE strength impairments as well as recommendation for follow-up HHPT. Pt ambulated ~150ft using RW mod-I to car room. Performed ambulatory car transfer using RW with supervision for safety and family education on proper positioningto provide supervision - pt/family demonstrated understanding. Pt/family educated on pt's need for family to continue doing laundry at home due to pt's washer/dryer being in basement. Pt ambulated ~150ft back to room using RW mod-I. Pt/family report no concerns/questions. Pt left sitting on EOB with bed alarm on, needs in reach, and family present.   PT Discharge Precautions/Restrictions Precautions Precautions: Fall Restrictions Weight Bearing Restrictions: No Pain Pain Assessment Pain Scale: Faces Faces Pain Scale: Hurts a little bit Pain Location: Back Pain Intervention(s): Medication (See eMAR);Rest;Repositioned;RN made aware;Other (Comment)(therapy to tolerance) Vision/Perception  Perception Perception: Within Functional Limits Praxis Praxis: Intact  Cognition Overall Cognitive Status: Within Functional Limits for tasks assessed Arousal/Alertness: Awake/alert Orientation Level: Oriented X4 Attention: Selective Selective Attention: Appears intact Memory: Appears intact Problem Solving: Appears intact Safety/Judgment: Appears  intact Sensation Sensation Light Touch: Impaired Detail Light Touch Impaired Details: Impaired LLE;Impaired RLE Hot/Cold: Appears Intact Proprioception: Appears Intact Stereognosis: Appears Intact Coordination Gross Motor Movements are   Fluid and Coordinated: Yes Fine Motor Movements are Fluid and Coordinated: Yes Motor  Motor Motor - Discharge Observations: generalized weakness  Mobility Bed Mobility Rolling Left: Independent with assistive device Supine to Sit: Independent with assistive device Sit to Supine: Independent with assistive device Transfers Sit to Stand: Independent with assistive device Stand to Sit: Independent with assistive device Stand Pivot Transfers: Independent with assistive device Transfer (Assistive device): Rolling walker Locomotion  Gait Gait Assistance: Independent with assistive device Assistive device: Rolling walker Stairs / Additional Locomotion Stairs: Yes Stairs Assistance: Minimal Assistance - Patient > 75% Stair Management Technique: One rail Left Number of Stairs: 4 Height of Stairs: 6  Trunk/Postural Assessment  Cervical Assessment Cervical Assessment: (fwd head) Thoracic Assessment Thoracic Assessment: (kyphosis) Lumbar Assessment Lumbar Assessment: (posterior pelvic tilt) Postural Control Postural Limitations: generalized weakness and decreased postural awareness  Balance Static Sitting Balance Static Sitting - Level of Assistance: 7: Independent Dynamic Sitting Balance Dynamic Sitting - Level of Assistance: 7: Independent Dynamic Standing Balance Dynamic Standing - Level of Assistance: 6: Modified independent (Device/Increase time) Extremity Assessment  RLE Assessment General Strength Comments: grossly 4-/5 LLE Assessment General Strength Comments: grossly 3/5    DONAWERTH,KAREN   , PT, DPT 08/22/2018, 11:48 AM  

## 2018-08-22 NOTE — Patient Care Conference (Signed)
Inpatient RehabilitationTeam Conference and Plan of Care Update Date: 08/20/2018   Time: 1:45 PM    Patient Name: Shelby Walker      Medical Record Number: [id number]  Date of Birth: 06-09-1933 Sex: Female         Room/Bed: 4M05C/4M05C-01 Payor Info: Payor: MEDICARE / Plan: MEDICARE PART A AND B / Product Type: *No Product type* /    Admitting Diagnosis: 5. Gen Team  Debility, malaise; 12-14days  Admit Date/Time:  08/06/2018 12:18 PM Admission Comments: No comment available   Primary Diagnosis:  <principal problem not specified> Principal Problem: <principal problem not specified>  Patient Active Problem List   Diagnosis Date Noted  . Thrombocytopenia ([place])   . Diarrhea of presumed infectious origin   . Essential hypertension   . Bradycardia   . Hypotension due to drugs   . Shortness of breath   . Hypophosphatemia   . Chest discomfort   . Pain   . Hypokalemia   . Steroid-induced hyperglycemia   . Acute blood loss anemia   . Leukocytosis   . Hypoalbuminemia due to protein-calorie malnutrition ([place])   . Debility 08/06/2018  . AKI (acute kidney injury) ([place])   . Diabetes mellitus type 2 in nonobese ([facility])   . New onset atrial fibrillation ([place])   . Shock ([place]) 07/30/2018  . Diverticulitis 07/24/2018  . Colitis presumed infectious 07/24/2018  . Colitis 07/24/2018  . Clostridium difficile colitis 07/24/2018  . Mixed hyperlipidemia 09/29/2016  . Stable angina ([place]) 09/29/2016  . Claudication in peripheral vascular disease ([place]) 08/09/2016  . Preoperative clearance 05/21/2012  . Precordial chest pain   . Palpitations   . Diabetes mellitus ([place])   . GERD (gastroesophageal reflux disease)   . Ejection fraction   . Carotid artery disease ([place])   . Mitral regurgitation 04/21/2009  . PSORIASIS 02/09/2009  . Rheumatoid arthritis ([place]) 02/09/2009    Expected Discharge Date: Expected Discharge Date: 08/23/18  Team Members Present: Physician leading conference: Dr. Delice Lesch Social Worker Present: Alfonse Alpers, LCSW Nurse Present: Isla Pence, RN PT Present: Roderic Ovens, PT OT Present: Laverle Hobby, OT SLP Present: Weston Anna, SLP PPS Coordinator present : Ileana Ladd, PT     Current Status/Progress Goal Weekly Team Focus  Medical   Debility secondary to septic shock due to C. difficile colitis  Improve mobility, self-care, electrolytes, HTN/DM, ABLA, thrombocytopenia  See above   Bowel/Bladder   Continent of bowel/bladder, Last BM 08/19/2018 Enteric Precautions  Remain continent of bowel/bladder  Remain on Isolation for C Diff, Continue antibiotic therapy, Assist with toileting needs QS   Swallow/Nutrition/ Hydration             ADL's   S with self care but needs min A with sit to stand from low surfaces, CGA with transfers  Supervision overall  ADL training, functional mobility, pt education, activity tolerance, general strengthening   Mobility   CGA transfers using RW, ambulation 226ft using RW with CGA, mod assist 4 stair navigation with unilateral handrail  S overall, CGA stairs  B LE strengthening, gait training, stair navigation, activity tolerance   Communication             Safety/Cognition/ Behavioral Observations            Pain   Kpad in place for lower back pain, PRN Tylenol  Remain at or below level 3 pain  Assess pain QS and PRN and medicate as needed.   Skin   MASD to  buttocks, Gerrherdts cream and Nystatin applied, Sacral abrasion covered with foam dressing, ecchymotic areas to Bilateral arms improving  Maintain skin integrity  Assess QS and PRN, Continue to apply topoical medications as ordered    Rehab Goals Patient on target to meet rehab goals: Yes Rehab Goals Revised: none *See Care Plan and progress notes for long and short-term goals.     Barriers to Discharge  Current Status/Progress Possible Resolutions Date Resolved   Physician    Medical stability     See above  Therapies, optimize DM/HTN meds,  follow labs      Nursing                  PT                    OT                  SLP                SW                Discharge Planning/Teaching Needs:  Pt to return to her home with her husband, dtr, and son to provide 24/7 supervision.  Dtr and son are coming for family education prior to pt's d/c.   Team Discussion:  Pt with up and down blood pressure last week, but better controlled this week.  Her blood sugars too fluctuated, but are better.  Pt's kidney fx is stabilizing as is her hemoglobin.  Pt with negative c-diff, so no more antibiotics needed and pt is not contagious.  Pt is incontinent of stool at times, continent of urine.  Her bottom is getting red from staying in one place and with multiple stools.  Pt using tylenol for pain and is tearful at times.  Pt is supervision with gait and stairs and with self care.  Revisions to Treatment Plan:  none    Continued Need for Acute Rehabilitation Level of Care: The patient requires daily medical management by a physician with specialized training in physical medicine and rehabilitation for the following conditions: Daily direction of a multidisciplinary physical rehabilitation program to ensure safe treatment while eliciting the highest outcome that is of practical value to the patient.: Yes Daily medical management of patient stability for increased activity during participation in an intensive rehabilitation regime.: Yes Daily analysis of laboratory values and/or radiology reports with any subsequent need for medication adjustment of medical intervention for : Other;Diabetes problems;Blood pressure problems   I attest that I was present, lead the team conference, and concur with the assessment and plan of the team.Team conference was held via web/ teleconference due to [place] - 19.   Wendle Kina, Silvestre Mesi 08/22/2018, 12:37 PM

## 2018-08-22 NOTE — Progress Notes (Signed)
Occupational Therapy Session Note  Patient Details  Name: Shelby Walker MRN: [id number] Date of Birth: [birth year]  Today's Date: 08/22/2018 OT Individual Time: 0845-1000 OT Individual Time Calculation (min): 75 min    Short Term Goals: Week 2:  OT Short Term Goal 1 (Week 2): STGs = LTGs  Skilled Therapeutic Interventions/Progress Updates:    Completed ADL retraining at overall Supervision to Mod I goals.  Pt completed shower and toilet transfers with distant supervision with RW.  Mod I for bathing and dressing at sit > stand level. Pt reports daughter plans to stay first few days post d/c to provide supervision for functional transfers and continue to problem solve and setup home to increase independence and safety.  Pt with 1 LOB during shower, however able to correct without any assistance.  Pt utilized figure 4 position and plastic foot piece to don TEDS with increased time this session.  Able to don incontinence brief in sitting and then stand to pull up, as pt plans to wear some sort of incontinence brief at home due to continued loose stools.  Pt reports mild pain in lower back post bathing/dressing, therefore returned to supine to rest until next therapy session.  Pt reports feeling that therapy has addressed all her and her daughter's concerns in preparation for d/c and is ready to d/c home.  Therapy Documentation Precautions:  Precautions Precautions: Fall Precaution Comments: left knee buckles Restrictions Weight Bearing Restrictions: No General:   Vital Signs: Therapy Vitals Pulse Rate: 66 BP: (!) 143/62 Pain: Pain Assessment Pain Scale: 0-10 Pain Score: 0-No pain   Therapy/Group: Individual Therapy  Simonne Come 08/22/2018, 11:19 AM

## 2018-08-22 NOTE — Progress Notes (Signed)
[place] PHYSICAL MEDICINE & REHABILITATION PROGRESS NOTE  Subjective/Complaints: Patient seen sitting up at the edge of her bed this morning.  She states she slept well overnight.  She has questions again regarding masking, but acknowledges that she has memory difficulties.  She is looking forward to discharge tomorrow.  ROS: Denies CP, shortness of breath, nausea, vomiting.  Objective: Vital Signs: Blood pressure (!) 143/62, pulse 66, temperature 98 F (36.7 C), resp. rate 16, height 5\' 1"  (1.549 m), weight 59.7 kg, SpO2 98 %. No results found. Recent Labs    08/20/18 0423 08/22/18 0612  WBC 5.0 5.8  HGB 8.4* 8.4*  HCT 27.5* 26.9*  PLT PLATELET CLUMPS NOTED ON SMEAR, COUNT APPEARS ADEQUATE 247   Recent Labs    08/22/18 0612  NA 143  K 4.9  CL 115*  CO2 22  GLUCOSE 148*  BUN 36*  CREATININE 1.12*  CALCIUM 8.3*    Physical Exam: BP (!) 143/62   Pulse 66   Temp 98 F (36.7 C)   Resp 16   Ht 5\' 1"  (1.549 m)   Wt 59.7 kg   SpO2 98%   BMI 24.87 kg/m  Constitutional: No distress . Vital signs reviewed. HENT: Normocephalic.  Atraumatic. Eyes: EOMI.  No discharge. Cardiovascular: No JVD. Respiratory: Normal effort. GI: Non-distended. Musc: No edema or tenderness in extremities. Musc: Joint deformities due to RA Neurological: She is alert.  Follows commands.   HOH Motor: Right upper extremity: 5/5 proximal distal, right lower extremity: 4-4+/5 proximal distal.  Left upper extremity: 4-4+/5 proximal distal, stable Left lower extremity: 4--4/5 proximal to distal, stable Skin: Skin is warm and dry.  Psychiatric: pleasant   Assessment/Plan: 1. Functional deficits secondary to debility which require 3+ hours per day of interdisciplinary therapy in a comprehensive inpatient rehab setting.  Physiatrist is providing close team supervision and 24 hour management of active medical problems listed below.  Physiatrist and rehab team continue to assess barriers to  discharge/monitor patient progress toward functional and medical goals  Care Tool:  Bathing    Body parts bathed by patient: Right arm, Left arm, Chest, Abdomen, Right upper leg, Left upper leg, Face, Front perineal area, Buttocks, Right lower leg, Left lower leg   Body parts bathed by helper: Buttocks, Front perineal area, Right lower leg, Left lower leg     Bathing assist Assist Level: Supervision/Verbal cueing     Upper Body Dressing/Undressing Upper body dressing   What is the patient wearing?: Bra, Pull over shirt    Upper body assist Assist Level: Supervision/Verbal cueing    Lower Body Dressing/Undressing Lower body dressing      What is the patient wearing?: Pants, Incontinence brief     Lower body assist Assist for lower body dressing: Supervision/Verbal cueing     Toileting Toileting    Toileting assist Assist for toileting: Set up assist     Transfers Chair/bed transfer  Transfers assist     Chair/bed transfer assist level: Supervision/Verbal cueing     Locomotion Ambulation   Ambulation assist      Assist level: Supervision/Verbal cueing Assistive device: Walker-rolling Max distance: 250ft   Walk 10 feet activity   Assist  Walk 10 feet activity did not occur: Safety/medical concerns(Per PT)  Assist level: Supervision/Verbal cueing Assistive device: Walker-rolling   Walk 50 feet activity   Assist Walk 50 feet with 2 turns activity did not occur: Safety/medical concerns(Per PT)  Assist level: Supervision/Verbal cueing Assistive device: Walker-rolling  Walk 150 feet activity   Assist Walk 150 feet activity did not occur: Safety/medical concerns(Per PT)  Assist level: Supervision/Verbal cueing Assistive device: Walker-rolling    Walk 10 feet on uneven surface  activity   Assist Walk 10 feet on uneven surfaces activity did not occur: Safety/medical concerns(Per PT)   Assist level: Minimal Assistance - Patient >  75% Assistive device: Aeronautical engineer Will patient use wheelchair at discharge?: No Type of Wheelchair: Manual    Wheelchair assist level: Supervision/Verbal cueing Max wheelchair distance: 50'    Wheelchair 50 feet with 2 turns activity    Assist    Wheelchair 50 feet with 2 turns activity did not occur: Safety/medical concerns(Per PT)   Assist Level: Supervision/Verbal cueing   Wheelchair 150 feet activity     Assist Wheelchair 150 feet activity did not occur: Safety/medical concerns(Per PT)   Assist Level: Supervision/Verbal cueing      Medical Problem List and Plan: 1.  Debility secondary to septic shock due to C. difficile colitis. Contact precautions  Continue CIR  Plan for d/c tomorrow  Will see patient for transitional care management in 1-2 weeks post-discharge 2.  Antithrombotics: -DVT/anticoagulation: Eliquis             -antiplatelet therapy: N/A 3. Pain Management: Oxycodone as needed  -appropriate shoe wear to accommodate right great toe 4. Mood: Provide emotional support             -antipsychotic agents: N/A 5. Neuropsych: This patient is capable of making decisions on her own behalf. 6. Skin/Wound Care: Routine skin checks 7. Fluids/Electrolytes/Nutrition: Routine in and outs  Megace ordered on 6/9 due to poor appetite, overall improved 8.  New onset PAF.  Continue Eliquis.    Lopressor 12.5 mg twice daily, changed to Coreg on 6/8 due to hypotension, DC'd on 6/9 due to hypotension/bradycardia             Coreg 3.25 restarted on 6/15 due to elevated heart rate and blood pressure Vitals:   08/22/18 0344 08/22/18 0920  BP: (!) 137/56 (!) 143/62  Pulse: 91 66  Resp: 16   Temp: 98 F (36.7 C)   SpO2: 98%    Relatively controlled on 6/19 9.    Steroid-induced hyperglycemia on diabetes mellitus.  Hemoglobin A1c 8.8.  Sliding scale insulin.  NovoLog 3 3 times daily started on 6/8, increased to 5 3 times daily on  6/16, increased to 7 3 times daily on 6/18  Insulin NPH 5 units twice daily started on 6/12 CBG (last 3)  Recent Labs    08/21/18 1717 08/21/18 2119 08/22/18 0632  GLUCAP 170* 130* 134*    Slightly labile on 6/19, will require further ambulatory monitoring with adjustments  Monitor with increased mobility 10.  Rheumatoid arthritis.  Patient on Enbrel 50 mg injection weekly and can be resumed as outpatient.  Continue chronic prednisone 5 mg daily 11.  AKI vs CKD.    Appreciate nephro recs, adjusting diuretics             Creatinine 1.12 on 6/19  Encourage fluids  Continue to monitor 12.  Hypoalbuminemia  Supplement initiated on 6/4 13.  Leukocytosis: Resolved-likely steroid-induced +/- C. difficile  Continue to monitor 14. Acute blood loss anemia  Hemoglobin 8.4 on 6/19  1 of 2 stools OB+  Continue to monitor 15.  Lumbar back pain  Secondary degenerative disc disease as well as facet arthropathy of lumbar spine  L-spine x-ray reviewed suggesting degenerative changes  Controlled on 6/8 16.  Hypokalemia  Potassium 4.9 on 6/19  Supplement DC'd 17.  Hypophosphatemia  Supplemented x1 dose on 6/8 18.  Shortness of breath: Resolved  Chest x-ray from 5/31 reviewed, showing bilateral small pleural effusions and atelectasis  Follow-up x-ray stable 19. Diarrhea: Improving  Vanc DC'd after discussion with ID.  C. Diff negative on 6/15 20.  Thrombocytopenia: Resolved  Platelets 247 on 6/19  LOS: 16 days A FACE TO FACE EVALUATION WAS PERFORMED  Ankit Lorie Phenix 08/22/2018, 9:53 AM

## 2018-08-22 NOTE — Progress Notes (Addendum)
Physical Therapy Session Note  Patient Details  Name: Shelby Walker MRN: [id number] Date of Birth: March 05, 1934  Today's Date: 08/22/2018 PT Individual Time: 1100-1145 and 1300-1325 PT Individual Time Calculation (min): 45 min and 25 min   Short Term Goals: Week 3:  PT Short Term Goal 1 (Week 3): = LTGs  Skilled Therapeutic Interventions/Progress Updates:    pt rec'd in bed, fatigued from shower but agreeable to therapy. Pt performs gait with RW throughout unit with mod I.  Transfers with mod I.  Stair training 2 steps x 3 attempts with LT rail only with min A.  Pt performs gait up/down ramp multiple attempts with RW and mod I.  Simulated car transfer with supervision and RW.  Review of HEP with pt performing 2 x 10 LAQ, standing marching, standing HS curl, heel raises, sit <> stand.  Pt left in bed with all needs at hand  Session 2: No c/o pain. Pt performs gait with mod I throughout unit. Gait with obstacle negotiation and with side stepping through obstacles with good awareness and maneuvering, mod I. Pt performs furniture transfers to sofa, recliner and bed all with mod I. Pt left in room with all needs at hand.  Therapy Documentation Precautions:  Precautions Precautions: Fall Precaution Comments: left knee buckles Restrictions Weight Bearing Restrictions: No Pain: Pain Assessment Pain Scale: 0-10 Pain Score: 0-No pain Faces Pain Scale: No hurt     Therapy/Group: Individual Therapy  Momin Misko 08/22/2018, 12:51 PM

## 2018-08-22 NOTE — Progress Notes (Signed)
Social Work Patient ID: Stefano Gaul, female   DOB: 12/16/1933, 85 y.o.   MRN: [id number]   CSW met with pt and talked with her dtr via telephone to update them on team discussion and to confirm family education.  Pt feels mostly ready for d/c, with some nervousness.  Dtr is prepared and felt even more so after family education yesterday.  Son comes today.  CSW is arranging [facility] and ordering DME for pt.  No other concerns/needs/questions.  CSW will continue to follow and assist as needed.

## 2018-08-23 LAB — GLUCOSE, CAPILLARY: Glucose-Capillary: 97 mg/dL (ref 70–99)

## 2018-08-23 NOTE — Progress Notes (Signed)
Patient anticipates discharge home today, in good spirits, no complaints

## 2018-08-23 NOTE — Progress Notes (Signed)
Shelby Walker was discharged today. She she left via wheelchair with nurse and nurse tech. She transferred very good from the wheelchair to the car. Vitals were good. No complaints of pain.

## 2018-08-23 NOTE — Progress Notes (Signed)
[place] PHYSICAL MEDICINE & REHABILITATION PROGRESS NOTE  Subjective/Complaints:  No issues overnite   ROS: Denies CP, shortness of breath, nausea, vomiting.  Objective: Vital Signs: Blood pressure (!) 111/47, pulse 82, temperature 98.1 F (36.7 C), resp. rate 16, height 5\' 1"  (1.549 m), weight 56.1 kg, SpO2 99 %. No results found. Recent Labs    08/22/18 0612  WBC 5.8  HGB 8.4*  HCT 26.9*  PLT 247   Recent Labs    08/22/18 0612  NA 143  K 4.9  CL 115*  CO2 22  GLUCOSE 148*  BUN 36*  CREATININE 1.12*  CALCIUM 8.3*    Physical Exam: BP (!) 111/47 (BP Location: Left Arm)   Pulse 82   Temp 98.1 F (36.7 C)   Resp 16   Ht 5\' 1"  (1.549 m)   Wt 56.1 kg   SpO2 99%   BMI 23.37 kg/m  Constitutional: No distress . Vital signs reviewed. HENT: Normocephalic.  Atraumatic. Eyes: EOMI.  No discharge. Cardiovascular: No JVD. Respiratory: Normal effort. GI: Non-distended. Musc: No edema or tenderness in extremities. Musc: Joint deformities due to RA Neurological: She is alert.  Follows commands.   HOH Motor: Right upper extremity: 5/5 proximal distal, right lower extremity: 4-4+/5 proximal distal.  Left upper extremity: 4-4+/5 proximal distal, stable Left lower extremity: 4--4/5 proximal to distal, stable Skin: Skin is warm and dry.  Psychiatric: pleasant   Assessment/Plan:  1. Functional deficits secondary to debility Stable for D/C today F/u PCP in 3-4 weeks F/u PM&R 2 weeks See D/C summary  See D/C instructions  Care Tool:  Bathing    Body parts bathed by patient: Right arm, Left arm, Chest, Abdomen, Right upper leg, Left upper leg, Face, Front perineal area, Buttocks, Right lower leg, Left lower leg   Body parts bathed by helper: Buttocks, Front perineal area, Right lower leg, Left lower leg     Bathing assist Assist Level: Supervision/Verbal cueing     Upper Body Dressing/Undressing Upper body dressing   What is the patient wearing?: Bra,  Pull over shirt    Upper body assist Assist Level: Independent with assistive device    Lower Body Dressing/Undressing Lower body dressing      What is the patient wearing?: Pants, Incontinence brief     Lower body assist Assist for lower body dressing: Independent with assitive device     Toileting Toileting    Toileting assist Assist for toileting: Independent with assistive device     Transfers Chair/bed transfer  Transfers assist     Chair/bed transfer assist level: Independent with assistive device Chair/bed transfer assistive device: Programmer, multimedia   Ambulation assist      Assist level: Independent with assistive device Assistive device: Walker-rolling Max distance: 150   Walk 10 feet activity   Assist  Walk 10 feet activity did not occur: Safety/medical concerns(Per PT)  Assist level: Independent with assistive device Assistive device: Walker-rolling   Walk 50 feet activity   Assist Walk 50 feet with 2 turns activity did not occur: Safety/medical concerns(Per PT)  Assist level: Independent with assistive device Assistive device: Walker-rolling    Walk 150 feet activity   Assist Walk 150 feet activity did not occur: Safety/medical concerns(Per PT)  Assist level: Independent with assistive device Assistive device: Walker-rolling    Walk 10 feet on uneven surface  activity   Assist Walk 10 feet on uneven surfaces activity did not occur: Safety/medical concerns(Per PT)   Assist level:  Supervision/Verbal cueing Assistive device: Aeronautical engineer Will patient use wheelchair at discharge?: No Type of Wheelchair: Manual    Wheelchair assist level: Supervision/Verbal cueing Max wheelchair distance: 50'    Wheelchair 50 feet with 2 turns activity    Assist    Wheelchair 50 feet with 2 turns activity did not occur: Safety/medical concerns(Per PT)   Assist Level: Supervision/Verbal  cueing   Wheelchair 150 feet activity     Assist Wheelchair 150 feet activity did not occur: Safety/medical concerns(Per PT)   Assist Level: Supervision/Verbal cueing      Medical Problem List and Plan: 1.  Debility secondary to septic shock due to C. difficile colitis. Contact precautions  Continue CIR  Plan for d/c today   Will see patient for transitional care management in 1-2 weeks post-discharge 2.  Antithrombotics: -DVT/anticoagulation: Eliquis             -antiplatelet therapy: N/A 3. Pain Management: Oxycodone as needed  -appropriate shoe wear to accommodate right great toe 4. Mood: Provide emotional support             -antipsychotic agents: N/A 5. Neuropsych: This patient is capable of making decisions on her own behalf. 6. Skin/Wound Care: Routine skin checks 7. Fluids/Electrolytes/Nutrition: Routine in and outs  Megace ordered on 6/9 due to poor appetite, overall improved 8.  New onset PAF.  Continue Eliquis.    Lopressor 12.5 mg twice daily, changed to Coreg on 6/8 due to hypotension, DC'd on 6/9 due to hypotension/bradycardia             Coreg 3.25 restarted on 6/15 due to elevated heart rate and blood pressure Vitals:   08/22/18 1900 08/23/18 0510  BP: (!) 152/60 (!) 111/47  Pulse: 88 82  Resp: 17 16  Temp: 98.6 F (37 C) 98.1 F (36.7 C)  SpO2: 100% 99%   controlled on 6/20 9.    Steroid-induced hyperglycemia on diabetes mellitus.  Hemoglobin A1c 8.8.  Sliding scale insulin.  NovoLog 3 3 times daily started on 6/8, increased to 5 3 times daily on 6/16, increased to 7 3 times daily on 6/18  Insulin NPH 5 units twice daily started on 6/12 CBG (last 3)  Recent Labs    08/22/18 1645 08/22/18 2120 08/23/18 0631  GLUCAP 123* 133* 97    Controlled 6/20   Monitor with increased mobility 10.  Rheumatoid arthritis.  Patient on Enbrel 50 mg injection weekly and can be resumed as outpatient.  Continue chronic prednisone 5 mg daily 11.  AKI vs CKD.     Appreciate nephro recs, adjusting diuretics             Creatinine 1.12 on 6/19  Encourage fluids  Continue to monitor 12.  Hypoalbuminemia  Supplement initiated on 6/4 13.  Leukocytosis: Resolved-likely steroid-induced +/- C. difficile  Continue to monitor 14. Acute blood loss anemia  Hemoglobin 8.4 on 6/19  1 of 2 stools OB+  Continue to monitor 15.  Lumbar back pain  Secondary degenerative disc disease as well as facet arthropathy of lumbar spine  L-spine x-ray reviewed suggesting degenerative changes  Controlled on 6/8 16.  Hypokalemia  Potassium 4.9 on 6/19  Supplement DC'd 17.  Hypophosphatemia  Supplemented x1 dose on 6/8 18.  Shortness of breath: Resolved  Chest x-ray from 5/31 reviewed, showing bilateral small pleural effusions and atelectasis  Follow-up x-ray stable 19. Diarrhea: Improving  Vanc DC'd after discussion with ID.  C.  Diff negative on 6/15 20.  Thrombocytopenia: Resolved  Platelets 247 on 6/19  LOS: 17 days A FACE TO [place] E Kirsteins 08/23/2018, 7:57 AM

## 2018-08-25 DIAGNOSIS — K219 Gastro-esophageal reflux disease without esophagitis: Secondary | ICD-10-CM | POA: Diagnosis not present

## 2018-08-25 DIAGNOSIS — M545 Low back pain: Secondary | ICD-10-CM | POA: Diagnosis not present

## 2018-08-25 DIAGNOSIS — I129 Hypertensive chronic kidney disease with stage 1 through stage 4 chronic kidney disease, or unspecified chronic kidney disease: Secondary | ICD-10-CM | POA: Diagnosis not present

## 2018-08-25 DIAGNOSIS — E1122 Type 2 diabetes mellitus with diabetic chronic kidney disease: Secondary | ICD-10-CM | POA: Diagnosis not present

## 2018-08-25 DIAGNOSIS — Z7982 Long term (current) use of aspirin: Secondary | ICD-10-CM | POA: Diagnosis not present

## 2018-08-25 DIAGNOSIS — Z794 Long term (current) use of insulin: Secondary | ICD-10-CM | POA: Diagnosis not present

## 2018-08-25 DIAGNOSIS — N189 Chronic kidney disease, unspecified: Secondary | ICD-10-CM | POA: Diagnosis not present

## 2018-08-25 DIAGNOSIS — I051 Rheumatic mitral insufficiency: Secondary | ICD-10-CM | POA: Diagnosis not present

## 2018-08-25 DIAGNOSIS — E1151 Type 2 diabetes mellitus with diabetic peripheral angiopathy without gangrene: Secondary | ICD-10-CM | POA: Diagnosis not present

## 2018-08-25 DIAGNOSIS — I6529 Occlusion and stenosis of unspecified carotid artery: Secondary | ICD-10-CM | POA: Diagnosis not present

## 2018-08-25 DIAGNOSIS — I4891 Unspecified atrial fibrillation: Secondary | ICD-10-CM | POA: Diagnosis not present

## 2018-08-25 DIAGNOSIS — E46 Unspecified protein-calorie malnutrition: Secondary | ICD-10-CM | POA: Diagnosis not present

## 2018-08-25 DIAGNOSIS — Z7952 Long term (current) use of systemic steroids: Secondary | ICD-10-CM | POA: Diagnosis not present

## 2018-08-25 DIAGNOSIS — K5790 Diverticulosis of intestine, part unspecified, without perforation or abscess without bleeding: Secondary | ICD-10-CM | POA: Diagnosis not present

## 2018-08-25 DIAGNOSIS — Z9181 History of falling: Secondary | ICD-10-CM | POA: Diagnosis not present

## 2018-08-25 DIAGNOSIS — M069 Rheumatoid arthritis, unspecified: Secondary | ICD-10-CM | POA: Diagnosis not present

## 2018-08-25 DIAGNOSIS — M810 Age-related osteoporosis without current pathological fracture: Secondary | ICD-10-CM | POA: Diagnosis not present

## 2018-08-25 DIAGNOSIS — L409 Psoriasis, unspecified: Secondary | ICD-10-CM | POA: Diagnosis not present

## 2018-08-25 DIAGNOSIS — E785 Hyperlipidemia, unspecified: Secondary | ICD-10-CM | POA: Diagnosis not present

## 2018-08-26 DIAGNOSIS — E46 Unspecified protein-calorie malnutrition: Secondary | ICD-10-CM | POA: Diagnosis not present

## 2018-08-26 DIAGNOSIS — N183 Chronic kidney disease, stage 3 (moderate): Secondary | ICD-10-CM | POA: Diagnosis not present

## 2018-08-26 DIAGNOSIS — R5381 Other malaise: Secondary | ICD-10-CM | POA: Diagnosis not present

## 2018-08-26 DIAGNOSIS — R197 Diarrhea, unspecified: Secondary | ICD-10-CM | POA: Diagnosis not present

## 2018-08-26 DIAGNOSIS — N189 Chronic kidney disease, unspecified: Secondary | ICD-10-CM | POA: Diagnosis not present

## 2018-08-26 DIAGNOSIS — M069 Rheumatoid arthritis, unspecified: Secondary | ICD-10-CM | POA: Diagnosis not present

## 2018-08-26 DIAGNOSIS — K219 Gastro-esophageal reflux disease without esophagitis: Secondary | ICD-10-CM | POA: Diagnosis not present

## 2018-08-26 DIAGNOSIS — E1151 Type 2 diabetes mellitus with diabetic peripheral angiopathy without gangrene: Secondary | ICD-10-CM | POA: Diagnosis not present

## 2018-08-26 DIAGNOSIS — A0472 Enterocolitis due to Clostridium difficile, not specified as recurrent: Secondary | ICD-10-CM | POA: Diagnosis not present

## 2018-08-26 DIAGNOSIS — I129 Hypertensive chronic kidney disease with stage 1 through stage 4 chronic kidney disease, or unspecified chronic kidney disease: Secondary | ICD-10-CM | POA: Diagnosis not present

## 2018-08-26 DIAGNOSIS — R6521 Severe sepsis with septic shock: Secondary | ICD-10-CM | POA: Diagnosis not present

## 2018-08-26 DIAGNOSIS — E1129 Type 2 diabetes mellitus with other diabetic kidney complication: Secondary | ICD-10-CM | POA: Diagnosis not present

## 2018-08-26 DIAGNOSIS — I4891 Unspecified atrial fibrillation: Secondary | ICD-10-CM | POA: Diagnosis not present

## 2018-08-26 DIAGNOSIS — N179 Acute kidney failure, unspecified: Secondary | ICD-10-CM | POA: Diagnosis not present

## 2018-08-26 DIAGNOSIS — E1122 Type 2 diabetes mellitus with diabetic chronic kidney disease: Secondary | ICD-10-CM | POA: Diagnosis not present

## 2018-08-27 ENCOUNTER — Emergency Department (HOSPITAL_COMMUNITY): Payer: Medicare Other

## 2018-08-27 ENCOUNTER — Encounter (HOSPITAL_COMMUNITY): Payer: Self-pay | Admitting: Emergency Medicine

## 2018-08-27 ENCOUNTER — Inpatient Hospital Stay (HOSPITAL_COMMUNITY)
Admission: EM | Admit: 2018-08-27 | Discharge: 2018-09-03 | DRG: 372 | Disposition: A | Discharge status: Home Health | Payer: Medicare Other | Attending: Internal Medicine | Admitting: Internal Medicine

## 2018-08-27 DIAGNOSIS — E860 Dehydration: Secondary | ICD-10-CM | POA: Diagnosis present

## 2018-08-27 DIAGNOSIS — R05 Cough: Secondary | ICD-10-CM | POA: Diagnosis not present

## 2018-08-27 DIAGNOSIS — A0472 Enterocolitis due to Clostridium difficile, not specified as recurrent: Secondary | ICD-10-CM

## 2018-08-27 DIAGNOSIS — E1151 Type 2 diabetes mellitus with diabetic peripheral angiopathy without gangrene: Secondary | ICD-10-CM | POA: Diagnosis present

## 2018-08-27 DIAGNOSIS — Z7952 Long term (current) use of systemic steroids: Secondary | ICD-10-CM

## 2018-08-27 DIAGNOSIS — I959 Hypotension, unspecified: Secondary | ICD-10-CM | POA: Diagnosis not present

## 2018-08-27 DIAGNOSIS — Z833 Family history of diabetes mellitus: Secondary | ICD-10-CM | POA: Diagnosis not present

## 2018-08-27 DIAGNOSIS — Z7901 Long term (current) use of anticoagulants: Secondary | ICD-10-CM

## 2018-08-27 DIAGNOSIS — R52 Pain, unspecified: Secondary | ICD-10-CM | POA: Diagnosis not present

## 2018-08-27 DIAGNOSIS — N179 Acute kidney failure, unspecified: Secondary | ICD-10-CM | POA: Diagnosis present

## 2018-08-27 DIAGNOSIS — I340 Nonrheumatic mitral (valve) insufficiency: Secondary | ICD-10-CM | POA: Diagnosis present

## 2018-08-27 DIAGNOSIS — I480 Paroxysmal atrial fibrillation: Secondary | ICD-10-CM | POA: Diagnosis present

## 2018-08-27 DIAGNOSIS — R5381 Other malaise: Secondary | ICD-10-CM | POA: Diagnosis present

## 2018-08-27 DIAGNOSIS — E1122 Type 2 diabetes mellitus with diabetic chronic kidney disease: Secondary | ICD-10-CM | POA: Diagnosis present

## 2018-08-27 DIAGNOSIS — R197 Diarrhea, unspecified: Secondary | ICD-10-CM | POA: Diagnosis not present

## 2018-08-27 DIAGNOSIS — E782 Mixed hyperlipidemia: Secondary | ICD-10-CM | POA: Diagnosis present

## 2018-08-27 DIAGNOSIS — Z20828 Contact with and (suspected) exposure to other viral communicable diseases: Secondary | ICD-10-CM | POA: Diagnosis present

## 2018-08-27 DIAGNOSIS — Z66 Do not resuscitate: Secondary | ICD-10-CM | POA: Diagnosis present

## 2018-08-27 DIAGNOSIS — E119 Type 2 diabetes mellitus without complications: Secondary | ICD-10-CM

## 2018-08-27 DIAGNOSIS — I700 Atherosclerosis of aorta: Secondary | ICD-10-CM | POA: Diagnosis present

## 2018-08-27 DIAGNOSIS — R531 Weakness: Secondary | ICD-10-CM | POA: Diagnosis not present

## 2018-08-27 DIAGNOSIS — K219 Gastro-esophageal reflux disease without esophagitis: Secondary | ICD-10-CM | POA: Diagnosis present

## 2018-08-27 DIAGNOSIS — M069 Rheumatoid arthritis, unspecified: Secondary | ICD-10-CM | POA: Diagnosis present

## 2018-08-27 DIAGNOSIS — L89151 Pressure ulcer of sacral region, stage 1: Secondary | ICD-10-CM | POA: Diagnosis present

## 2018-08-27 DIAGNOSIS — E8809 Other disorders of plasma-protein metabolism, not elsewhere classified: Secondary | ICD-10-CM | POA: Diagnosis present

## 2018-08-27 DIAGNOSIS — R059 Cough, unspecified: Secondary | ICD-10-CM

## 2018-08-27 DIAGNOSIS — Z8249 Family history of ischemic heart disease and other diseases of the circulatory system: Secondary | ICD-10-CM

## 2018-08-27 DIAGNOSIS — R110 Nausea: Secondary | ICD-10-CM | POA: Diagnosis not present

## 2018-08-27 DIAGNOSIS — K5900 Constipation, unspecified: Secondary | ICD-10-CM | POA: Diagnosis not present

## 2018-08-27 DIAGNOSIS — K529 Noninfective gastroenteritis and colitis, unspecified: Secondary | ICD-10-CM

## 2018-08-27 DIAGNOSIS — E872 Acidosis: Secondary | ICD-10-CM | POA: Diagnosis present

## 2018-08-27 DIAGNOSIS — R1084 Generalized abdominal pain: Secondary | ICD-10-CM | POA: Diagnosis not present

## 2018-08-27 DIAGNOSIS — R627 Adult failure to thrive: Secondary | ICD-10-CM | POA: Diagnosis present

## 2018-08-27 DIAGNOSIS — N183 Chronic kidney disease, stage 3 (moderate): Secondary | ICD-10-CM | POA: Diagnosis present

## 2018-08-27 DIAGNOSIS — Z794 Long term (current) use of insulin: Secondary | ICD-10-CM

## 2018-08-27 DIAGNOSIS — M810 Age-related osteoporosis without current pathological fracture: Secondary | ICD-10-CM | POA: Diagnosis present

## 2018-08-27 DIAGNOSIS — Z882 Allergy status to sulfonamides status: Secondary | ICD-10-CM

## 2018-08-27 DIAGNOSIS — R140 Abdominal distension (gaseous): Secondary | ICD-10-CM | POA: Diagnosis not present

## 2018-08-27 DIAGNOSIS — A0471 Enterocolitis due to Clostridium difficile, recurrent: Principal | ICD-10-CM | POA: Diagnosis present

## 2018-08-27 DIAGNOSIS — L8990 Pressure ulcer of unspecified site, unspecified stage: Secondary | ICD-10-CM | POA: Insufficient documentation

## 2018-08-27 DIAGNOSIS — Z79899 Other long term (current) drug therapy: Secondary | ICD-10-CM

## 2018-08-27 DIAGNOSIS — L409 Psoriasis, unspecified: Secondary | ICD-10-CM

## 2018-08-27 DIAGNOSIS — D72829 Elevated white blood cell count, unspecified: Secondary | ICD-10-CM | POA: Diagnosis not present

## 2018-08-27 DIAGNOSIS — E46 Unspecified protein-calorie malnutrition: Secondary | ICD-10-CM | POA: Diagnosis present

## 2018-08-27 DIAGNOSIS — I129 Hypertensive chronic kidney disease with stage 1 through stage 4 chronic kidney disease, or unspecified chronic kidney disease: Secondary | ICD-10-CM | POA: Diagnosis present

## 2018-08-27 DIAGNOSIS — R188 Other ascites: Secondary | ICD-10-CM | POA: Diagnosis not present

## 2018-08-27 DIAGNOSIS — Z888 Allergy status to other drugs, medicaments and biological substances status: Secondary | ICD-10-CM

## 2018-08-27 DIAGNOSIS — I10 Essential (primary) hypertension: Secondary | ICD-10-CM | POA: Diagnosis not present

## 2018-08-27 LAB — COMPREHENSIVE METABOLIC PANEL
ALT: 9 U/L (ref 0–44)
AST: 12 U/L — ABNORMAL LOW (ref 15–41)
Albumin: 2 g/dL — ABNORMAL LOW (ref 3.5–5.0)
Alkaline Phosphatase: 57 U/L (ref 38–126)
Anion gap: 12 (ref 5–15)
BUN: 37 mg/dL — ABNORMAL HIGH (ref 8–23)
CO2: 16 mmol/L — ABNORMAL LOW (ref 22–32)
Calcium: 6.7 mg/dL — ABNORMAL LOW (ref 8.9–10.3)
Chloride: 109 mmol/L (ref 98–111)
Creatinine, Ser: 1.95 mg/dL — ABNORMAL HIGH (ref 0.44–1.00)
GFR calc Af Amer: 27 mL/min — ABNORMAL LOW (ref >60)
GFR calc non Af Amer: 23 mL/min — ABNORMAL LOW (ref >60)
Glucose, Bld: 120 mg/dL — ABNORMAL HIGH (ref 70–99)
Potassium: 3.8 mmol/L (ref 3.5–5.1)
Sodium: 137 mmol/L (ref 135–145)
Total Bilirubin: 0.7 mg/dL (ref 0.3–1.2)
Total Protein: 4.9 g/dL — ABNORMAL LOW (ref 6.5–8.1)

## 2018-08-27 LAB — CBC WITH DIFFERENTIAL/PLATELET
Abs Immature Granulocytes: 0.2 10*3/uL — ABNORMAL HIGH (ref 0.00–0.07)
Basophils Absolute: 0 10*3/uL (ref 0.0–0.1)
Basophils Relative: 0 %
Eosinophils Absolute: 0 10*3/uL (ref 0.0–0.5)
Eosinophils Relative: 0 %
HCT: 29.3 % — ABNORMAL LOW (ref 36.0–46.0)
Hemoglobin: 9.1 g/dL — ABNORMAL LOW (ref 12.0–15.0)
Lymphocytes Relative: 15 %
Lymphs Abs: 2.3 10*3/uL (ref 0.7–4.0)
MCH: 29 pg (ref 26.0–34.0)
MCHC: 31.1 g/dL (ref 30.0–36.0)
MCV: 93.3 fL (ref 80.0–100.0)
Metamyelocytes Relative: 1 %
Monocytes Absolute: 0.8 10*3/uL (ref 0.1–1.0)
Monocytes Relative: 5 %
Neutro Abs: 11.9 10*3/uL — ABNORMAL HIGH (ref 1.7–7.7)
Neutrophils Relative %: 79 %
Platelets: 356 10*3/uL (ref 150–400)
RBC: 3.14 MIL/uL — ABNORMAL LOW (ref 3.87–5.11)
RDW: 17.4 % — ABNORMAL HIGH (ref 11.5–15.5)
WBC: 15.1 10*3/uL — ABNORMAL HIGH (ref 4.0–10.5)
nRBC: 0 % (ref 0.0–0.2)
nRBC: 0 /100 WBC

## 2018-08-27 LAB — GLUCOSE, CAPILLARY
Glucose-Capillary: 103 mg/dL — ABNORMAL HIGH (ref 70–99)
Glucose-Capillary: 125 mg/dL — ABNORMAL HIGH (ref 70–99)

## 2018-08-27 LAB — C DIFFICILE QUICK SCREEN W PCR REFLEX
C Diff antigen: POSITIVE — AB
C Diff interpretation: DETECTED
C Diff toxin: POSITIVE — AB

## 2018-08-27 LAB — SARS CORONAVIRUS 2 BY RT PCR (HOSPITAL ORDER, PERFORMED IN ~~LOC~~ HOSPITAL LAB): SARS Coronavirus 2: NEGATIVE

## 2018-08-27 LAB — LIPASE, BLOOD: Lipase: 19 U/L (ref 11–51)

## 2018-08-27 MED ORDER — ACETAMINOPHEN 325 MG PO TABS
650.0000 mg | ORAL_TABLET | Freq: Four times a day (QID) | ORAL | Status: DC | PRN
  Filled 2018-08-27: qty 2

## 2018-08-27 MED ORDER — SODIUM CHLORIDE 0.9 % IV BOLUS
500.0000 mL | Freq: Once | INTRAVENOUS | Status: AC
  Administered 2018-08-27: 500 mL via INTRAVENOUS

## 2018-08-27 MED ORDER — ATORVASTATIN CALCIUM 10 MG PO TABS
20.0000 mg | ORAL_TABLET | Freq: Every day | ORAL | Status: DC
  Administered 2018-08-28 – 2018-09-02 (×6): 20 mg via ORAL
  Filled 2018-08-27 (×7): qty 2

## 2018-08-27 MED ORDER — APIXABAN 2.5 MG PO TABS
2.5000 mg | ORAL_TABLET | Freq: Two times a day (BID) | ORAL | Status: DC
  Administered 2018-08-28 – 2018-09-03 (×13): 2.5 mg via ORAL
  Filled 2018-08-27 (×14): qty 1

## 2018-08-27 MED ORDER — SODIUM CHLORIDE 0.9 % IV BOLUS
1000.0000 mL | Freq: Once | INTRAVENOUS | Status: AC
  Administered 2018-08-27: 1000 mL via INTRAVENOUS

## 2018-08-27 MED ORDER — SODIUM CHLORIDE 0.9% FLUSH
3.0000 mL | Freq: Two times a day (BID) | INTRAVENOUS | Status: DC
  Administered 2018-08-29 – 2018-09-02 (×3): 3 mL via INTRAVENOUS

## 2018-08-27 MED ORDER — VANCOMYCIN 50 MG/ML ORAL SOLUTION
125.0000 mg | Freq: Four times a day (QID) | ORAL | Status: DC
  Administered 2018-08-27 (×2): 125 mg via ORAL
  Filled 2018-08-27 (×4): qty 2.5

## 2018-08-27 MED ORDER — VANCOMYCIN 50 MG/ML ORAL SOLUTION: 125.0000 mg | Freq: Every day | ORAL | Status: DC

## 2018-08-27 MED ORDER — MORPHINE SULFATE (PF) 2 MG/ML IV SOLN
2.0000 mg | INTRAVENOUS | Status: DC | PRN
  Administered 2018-08-27: 2 mg via INTRAVENOUS
  Filled 2018-08-27: qty 1

## 2018-08-27 MED ORDER — INSULIN ASPART 100 UNIT/ML ~~LOC~~ SOLN
0.0000 [IU] | Freq: Every day | SUBCUTANEOUS | Status: DC
  Administered 2018-08-29 – 2018-08-31 (×2): 2 [IU] via SUBCUTANEOUS

## 2018-08-27 MED ORDER — SODIUM CHLORIDE 0.9 % IV SOLN
INTRAVENOUS | Status: DC
  Administered 2018-08-27 – 2018-08-28 (×3): via INTRAVENOUS

## 2018-08-27 MED ORDER — ACETAMINOPHEN 650 MG RE SUPP: 650.0000 mg | Freq: Four times a day (QID) | RECTAL | Status: DC | PRN

## 2018-08-27 MED ORDER — ONDANSETRON HCL 4 MG/2ML IJ SOLN
4.0000 mg | Freq: Four times a day (QID) | INTRAMUSCULAR | Status: DC | PRN
  Administered 2018-09-02: 4 mg via INTRAVENOUS
  Filled 2018-08-27: qty 2

## 2018-08-27 MED ORDER — VANCOMYCIN 50 MG/ML ORAL SOLUTION: 125.0000 mg | ORAL | Status: DC

## 2018-08-27 MED ORDER — SODIUM CHLORIDE 0.9 % IV SOLN: 2.0000 g | Freq: Once | INTRAVENOUS | Status: DC

## 2018-08-27 MED ORDER — INSULIN ASPART 100 UNIT/ML ~~LOC~~ SOLN
0.0000 [IU] | Freq: Three times a day (TID) | SUBCUTANEOUS | Status: DC
  Administered 2018-08-28 (×3): 2 [IU] via SUBCUTANEOUS
  Administered 2018-08-29 (×2): 3 [IU] via SUBCUTANEOUS
  Administered 2018-08-29: 2 [IU] via SUBCUTANEOUS
  Administered 2018-08-30: 1 [IU] via SUBCUTANEOUS
  Administered 2018-08-30: 2 [IU] via SUBCUTANEOUS
  Administered 2018-08-31 (×2): 1 [IU] via SUBCUTANEOUS
  Administered 2018-08-31: 2 [IU] via SUBCUTANEOUS
  Administered 2018-09-01 – 2018-09-02 (×4): 1 [IU] via SUBCUTANEOUS
  Administered 2018-09-02 (×2): 3 [IU] via SUBCUTANEOUS

## 2018-08-27 MED ORDER — VANCOMYCIN 50 MG/ML ORAL SOLUTION: 125.0000 mg | Freq: Two times a day (BID) | ORAL | Status: DC

## 2018-08-27 MED ORDER — METRONIDAZOLE IN NACL 5-0.79 MG/ML-% IV SOLN: 500.0000 mg | Freq: Once | INTRAVENOUS | Status: DC

## 2018-08-27 MED ORDER — HYDROCORTISONE NA SUCCINATE PF 100 MG IJ SOLR
50.0000 mg | Freq: Four times a day (QID) | INTRAMUSCULAR | Status: DC
  Administered 2018-08-27 – 2018-09-02 (×24): 50 mg via INTRAVENOUS
  Filled 2018-08-27 (×23): qty 2

## 2018-08-27 MED ORDER — ONDANSETRON HCL 4 MG PO TABS: 4.0000 mg | ORAL_TABLET | Freq: Four times a day (QID) | ORAL | Status: DC | PRN

## 2018-08-27 MED ORDER — FLEET ENEMA 7-19 GM/118ML RE ENEM: 1.0000 | ENEMA | Freq: Once | RECTAL | Status: DC

## 2018-08-27 MED ORDER — VANCOMYCIN 50 MG/ML ORAL SOLUTION
500.0000 mg | Freq: Two times a day (BID) | ORAL | Status: DC
  Filled 2018-08-27 (×2): qty 10

## 2018-08-27 MED ORDER — MORPHINE SULFATE (PF) 2 MG/ML IV SOLN
2.0000 mg | Freq: Once | INTRAVENOUS | Status: AC
  Administered 2018-08-27: 0.5 mg via INTRAVENOUS
  Filled 2018-08-27: qty 1

## 2018-08-27 NOTE — ED Notes (Signed)
Family at bedside. 

## 2018-08-27 NOTE — Progress Notes (Signed)
Shelby Walker is a 85 y.o. female patient admitted from ED awake, alert - oriented  X 4 - no acute distress noted.  VSS - Blood pressure 136/60, pulse 87, temperature 97.9 F (36.6 C), temperature source Oral, resp. rate 20, SpO2 100 %.    IV in place, occlusive dsg intact without redness.  Orientation to room, and floor completed with information packet given to patient/family.  Patient declined safety video at this time.  Admission INP armband ID verified with patient/family, and in place.   SR up x 2, fall assessment complete, with patient and family able to verbalize understanding of risk associated with falls, and verbalized understanding to call nsg before up out of bed.  Call light within reach, patient able to voice, and demonstrate understanding.  On exam pt noted to have discoloration on bilateral extremities, abrasions on bilateral arms, and a stage 1 pressure injury on sacrum   Will cont to eval and treat per MD orders.  Luci Bank, RN 08/27/2018 4:27 PM

## 2018-08-27 NOTE — Consult Note (Signed)
[facility]    Date of Admission:  08/27/2018     Total days of antibiotics 1               Reason for Consult: Recurrent C. Difficle  Referring Provider: Lorin Mercy Primary Care Provider: Reynold Bowen, MD   Assessment/Plan:  Shelby Walker is an 85 year old female recently hospitalized on 5/20 with severe C. Difficile infection s/p 14 day treatment with 500 mg of vancomycin recently discharged from rehabilitation and now returning with recurrent diarrhea and positive for C. Difficile Colitis.   C. Difficile Colitis - Previous tested negative 9 days ago. This is consistent with recurrent C. Difficile colitis. Continue with supportive management. Agree with Vancomycin with the following taper:  125 mg orally 4 times daily for 10 days, then 125 mg orally twice daily for 7 days, then 125 mg orally once daily for 7 days, then 125 mg orally every 2 or 3 days for 4 weeks   Active Problems:   C. difficile colitis   . apixaban  2.5 mg Oral BID  . atorvastatin  20 mg Oral QHS  . hydrocortisone sod succinate (SOLU-CORTEF) inj  50 mg Intravenous Q6H  . insulin aspart  0-5 Units Subcutaneous QHS  . insulin aspart  0-9 Units Subcutaneous TID WC  . sodium chloride flush  3 mL Intravenous Q12H  . vancomycin  125 mg Oral QID     HPI: Shelby Walker is a 85 y.o. female with previous medical history of rheumatoid arthritis, psoriasis, osteoporosis, diabetes, and recent C. difficile colitis in May 2020 with severe sepsis presenting to the emergency department with abdominal pain and diarrhea.  Shelby Walker was admitted to the hospital from 07/24/18 until 08/06/18 with infectious colitis from which she was treated as an outpatient for 7 days with ciprofloxacin and metronidazole without improvement in her symptoms. She was having 8 bowel movements per day with decreased oral intake. On admission found to be febrile with temp of 100.5 and tachycardic. WBC count was 15.3 with  Sars-Cov-2 negative. CT of the abdomen had diffuse colitis without perforation. Her stool was tested for C. Difficile and found to have positive antigen, toxin and PCR. GI panel was negative. She was treated with oral vancomycin for 14 days at 500 mg. She was discharged to rehabilitation with improved symptoms.   Shelby Walker was discharged from rehabilitation on 08/23/18 and began having diarrhea again 2-3 days ago. She was started on oral vancomycin without improvement. Now admitted with sharp, cramping abdominal pain in her lower abdomen with worsening diarrhea. Described as mucousy, and non-bloody. WBC count of 15.1 and creatinine of 1.95. CT scan of the abdomin and pelvis with: Diffusely thickened and surrounding inflammatory fat stranding.  Findings are worsening in comparison to prior exam on 07/24/2018 with concern for infectious, inflammatory, or ischemic colitis.  Stool sent for testing with C. Difficile with positive antigen, toxin and PCR test. Previous test 9 days ago was negative for C. Difficile. Current antimicrobial therapy with oral vancomycin 125 mg 4 times daily.   Shelby Walker daughter provides a significant part of the following history. Discharged from the hospital on Saturday and started having loose, mucousy bowel movements on Sunday averaging about 6-8 per day. There were times that she was incontinent and was unable to get to the bathroom. Shelby Walker PCP office restarted her on vancomycin however she has not seen much improvement which brought her to the ED. Ms.  Walker is feeling tired and fatigue with some pain around her back side from multiple bowel movements.  Review of Systems: Review of Systems  Constitutional: Positive for malaise/fatigue. Negative for chills, fever and weight loss.  Respiratory: Negative for cough, shortness of breath and wheezing.   Cardiovascular: Negative for chest pain and leg swelling.  Gastrointestinal: Positive for diarrhea. Negative for abdominal  pain, constipation, nausea and vomiting.  Skin: Negative for rash.     Past Medical History:  Diagnosis Date  . Bruit    Carotid Doppler showed no significant abnormality     9per patient)  . C. difficile colitis 07/2018   with severe sepsis  . Chest pain, unspecified    Nuclear, May, 2008, no scar or ischemia  . Diabetes mellitus   . Dyslipidemia   . Ejection fraction    EF 55-60%, echo, February, 2011  . GERD (gastroesophageal reflux disease)   . Mitral regurgitation April 21, 2009   mild,  echo, February, 2011  . Osteoporosis   . Palpitations    possible very brief atrial fibrillation on monitor and possible reentrant tachycardia  . Psoriasis   . Rheumatoid arthritis ([place])     Social History   Tobacco Use  . Smoking status: Never Smoker  . Smokeless tobacco: Never Used  Substance Use Topics  . Alcohol use: No    Alcohol/week: 0.0 standard drinks  . Drug use: No    Family History  Problem Relation Age of Onset  . Heart failure Father   . Heart attack Father   . Diabetes Father   . Heart attack Mother   . Heart failure Mother   . Diabetes Mother   . Stroke Sister     Allergies  Allergen Reactions  . Compazine [Prochlorperazine Edisylate] Other (See Comments)    REACTION: " tongue swell and unable to swallow"  . Sulfonamide Derivatives Other (See Comments)    REACTION: " broke out with fine itching bumps"    OBJECTIVE: Blood pressure (!) 133/52, pulse 87, temperature 98.4 F (36.9 C), temperature source Oral, resp. rate (!) 22, SpO2 100 %.  Physical Exam Constitutional:      General: She is not in acute distress. Cardiovascular:     Rate and Rhythm: Normal rate and regular rhythm.     Heart sounds: Normal heart sounds.  Pulmonary:     Effort: Pulmonary effort is normal.     Breath sounds: Normal breath sounds.  Abdominal:     General: Bowel sounds are normal.     Tenderness: There is generalized abdominal tenderness. There is no guarding or  rebound. Negative signs include Murphy's sign.  Skin:    General: Skin is warm and dry.  Neurological:     Mental Status: She is alert and oriented to person, place, and time.  Psychiatric:        Behavior: Behavior normal.        Thought Content: Thought content normal.        Judgment: Judgment normal.     Lab Results Lab Results  Component Value Date   WBC 15.1 (H) 08/27/2018   HGB 9.1 (L) 08/27/2018   HCT 29.3 (L) 08/27/2018   MCV 93.3 08/27/2018   PLT 356 08/27/2018    Lab Results  Component Value Date   CREATININE 1.95 (H) 08/27/2018   BUN 37 (H) 08/27/2018   NA 137 08/27/2018   K 3.8 08/27/2018   CL 109 08/27/2018   CO2 16 (L) 08/27/2018  Lab Results  Component Value Date   ALT 9 08/27/2018   AST 12 (L) 08/27/2018   ALKPHOS 57 08/27/2018   BILITOT 0.7 08/27/2018     Microbiology: Recent Results (from the past 240 hour(s))  C difficile quick scan w PCR reflex     Status: None   Collection Time: 08/18/18  9:41 AM   Specimen: STOOL  Result Value Ref Range Status   C Diff antigen NEGATIVE NEGATIVE Final   C Diff toxin NEGATIVE NEGATIVE Final   C Diff interpretation No C. difficile detected.  Final    Comment: Performed at [facility], [place] [address] [place], [place] [address]  SARS Coronavirus 2 (CEPHEID - Performed in [place] hospital lab), Hosp Order     Status: None   Collection Time: 08/27/18  9:28 AM   Specimen: Nasopharyngeal Swab  Result Value Ref Range Status   SARS Coronavirus 2 NEGATIVE NEGATIVE Final    Comment: (NOTE) If result is NEGATIVE SARS-CoV-2 target nucleic acids are NOT DETECTED. The SARS-CoV-2 RNA is generally detectable in upper and lower  respiratory specimens during the acute phase of infection. The lowest  concentration of SARS-CoV-2 viral copies this assay can detect is 250  copies / mL. A negative result does not preclude SARS-CoV-2 infection  and should not be used as the sole basis for treatment or other   patient management decisions.  A negative result may occur with  improper specimen collection / handling, submission of specimen other  than nasopharyngeal swab, presence of viral mutation(s) within the  areas targeted by this assay, and inadequate number of viral copies  (<250 copies / mL). A negative result must be combined with clinical  observations, patient history, and epidemiological information. If result is POSITIVE SARS-CoV-2 target nucleic acids are DETECTED. The SARS-CoV-2 RNA is generally detectable in upper and lower  respiratory specimens dur ing the acute phase of infection.  Positive  results are indicative of active infection with SARS-CoV-2.  Clinical  correlation with patient history and other diagnostic information is  necessary to determine patient infection status.  Positive results do  not rule out bacterial infection or co-infection with other viruses. If result is PRESUMPTIVE POSTIVE SARS-CoV-2 nucleic acids MAY BE PRESENT.   A presumptive positive result was obtained on the submitted specimen  and confirmed on repeat testing.  While 2019 novel coronavirus  (SARS-CoV-2) nucleic acids may be present in the submitted sample  additional confirmatory testing may be necessary for epidemiological  and / or clinical management purposes  to differentiate between  SARS-CoV-2 and other Sarbecovirus currently known to infect humans.  If clinically indicated additional testing with an alternate test  methodology [phone]) is advised. The SARS-CoV-2 RNA is generally  detectable in upper and lower respiratory sp ecimens during the acute  phase of infection. The expected result is Negative. Fact Sheet for Patients:  StrictlyIdeas.no Fact Sheet for Healthcare Providers: BankingDealers.co.za This test is not yet approved or cleared by the [place] FDA and has been authorized for detection and/or diagnosis of SARS-CoV-2 by  FDA under an Emergency Use Authorization (EUA).  This EUA will remain in effect (meaning this test can be used) for the duration of the COVID-19 declaration under Section 564(b)(1) of the Act, 21 U.S.C. section 360bbb-3(b)(1), unless the authorization is terminated or revoked sooner. Performed at [facility], [place] [address], [place], [place] [address]   C Difficile Quick Screen w PCR reflex  Status: Abnormal   Collection Time: 08/27/18 12:00 PM   Specimen: Stool  Result Value Ref Range Status   C Diff antigen POSITIVE (A) NEGATIVE Final   C Diff toxin POSITIVE (A) NEGATIVE Final   C Diff interpretation Toxin producing C. difficile detected.  Final    Comment: CRITICAL RESULT CALLED TO, READ BACK BY AND VERIFIED WITH: Johnette Abraham Preyer RN 14:35 08/27/18 ([email or web address]) Performed at [facility], [place] [address], [place], [place] [address]      Terri Piedra, [place] [facility] [employer] [phone] Pager  08/27/2018  3:12 PM

## 2018-08-27 NOTE — ED Notes (Signed)
RN checked pt's brief and no stool for sample

## 2018-08-27 NOTE — ED Notes (Signed)
REPORT given to [place], [place]

## 2018-08-27 NOTE — ED Triage Notes (Signed)
Pt here from home with abd pain and nausea and diarrhea , pt recently;y in hospital with c diff , started back on Vanc yesterday

## 2018-08-27 NOTE — ED Notes (Signed)
Pt changed and stool sample obtained. Breakdown stage 1 noted to sacral area and 2 open tears to top of gluteal fold

## 2018-08-27 NOTE — H&P (Addendum)
History and Physical    PHILLIPPA STRAUB JAS:[id number] DOB: [birth year] DOA: 08/27/2018  PCP: Reynold Bowen, MD Consultants:  Marijean Bravo - rheumatology; Nahser - cardiology Patient coming from:  Home - lives with husband; NOK: Husband and daughter [name] [phone]  Chief Complaint: Diarrhea  HPI: Shelby Walker is a 85 y.o. female with medical history significant of RA; HLD; and DM presenting with diarrhea.  Symptoms started May 6 - went to ER and diagnosed with diverticulitis.  Sent home with 2 antibiotics.  Slight improvement and then worsened.  On May 22, went back to [facility] and had + C  Diff testing, so admitted at [facility].  She ended up in ICU for about a week and then spent another week on the floor.  She then went to CIR for 2 weeks.  She went home on 6/20.  She was doing very well.  Late Sunday, she developed cramping abdominal pain and started with recurrent diarrhea.  She vomited twice on Monday.  The diarrhea worsened in frequency and amounts, with incontinence.  Slight fever to 100 one day but otherwise no fever.  [facility] came in early in the week and daughter has been closely tracking her vitals.  Her BP has bene low but not in the 80s - has mostly been 90/45.  Last night it was 98/50.   Her normal is maybe 116.  She is light-headed/dizzy with standing.  Last BM was about 630 this AM, none in ER.  She was having them hourly this AM.  She was recommended to restart oral Vanc yesterday.  She tends to have stools with ambulation/transfers.  She is not on Enbrel right now and so rheum yesterday increased prednisone from 5 to 10 mg daily.  Last dose of Enbrel was about 5/15.   ED Course:  Admitted for diverticulitis in May, went to rehab, got C. Diff.  Released 4 days ago, worsening.  Started on oral Vanc last night.  Soft BPs, nonspecific colitis on CT.  Needs ongoing treatment.  COVID negative.  Review of Systems: As per HPI; otherwise review of systems reviewed and negative.   Ambulatory Status:   Ambulates with a walker  Past Medical History:  Diagnosis Date  . Bruit    Carotid Doppler showed no significant abnormality     9per patient)  . C. difficile colitis 07/2018   with severe sepsis  . Chest pain, unspecified    Nuclear, May, 2008, no scar or ischemia  . Diabetes mellitus   . Dyslipidemia   . Ejection fraction    EF 55-60%, echo, February, 2011  . GERD (gastroesophageal reflux disease)   . Mitral regurgitation April 21, 2009   mild,  echo, February, 2011  . Osteoporosis   . Palpitations    possible very brief atrial fibrillation on monitor and possible reentrant tachycardia  . Psoriasis   . Rheumatoid arthritis [facility])     Past Surgical History:  Procedure Laterality Date  . FRACTURE SURGERY      Social History   Socioeconomic History  . Marital status: Married    Spouse name: [name]  . Number of children: 2  . Years of education: 69  . Highest education level: Associate degree: academic program  Occupational History  . Not on file  Social Needs  . Financial resource strain: Not hard at all  . Food insecurity    Worry: Never true    Inability: Never true  . Transportation needs    Medical: No  Non-medical: No  Tobacco Use  . Smoking status: Never Smoker  . Smokeless tobacco: Never Used  Substance and Sexual Activity  . Alcohol use: No    Alcohol/week: 0.0 standard drinks  . Drug use: No  . Sexual activity: Yes    Birth control/protection: Post-menopausal  Lifestyle  . Physical activity    Days per week: 5 days    Minutes per session: 30 min  . Stress: Not at all  Relationships  . Social connections    Talks on phone: More than three times a week    Gets together: More than three times a week    Attends religious service: More than 4 times per year    Active member of club or organization: Yes    Attends meetings of clubs or organizations: More than 4 times per year    Relationship status: Married  . Intimate partner violence     Fear of current or ex partner: No    Emotionally abused: No    Physically abused: No    Forced sexual activity: No  Other Topics Concern  . Not on file  Social History Narrative  . Not on file    Allergies  Allergen Reactions  . Compazine [Prochlorperazine Edisylate] Other (See Comments)    REACTION: " tongue swell and unable to swallow"  . Sulfonamide Derivatives Other (See Comments)    REACTION: " broke out with fine itching bumps"    Family History  Problem Relation Age of Onset  . Heart failure Father   . Heart attack Father   . Diabetes Father   . Heart attack Mother   . Heart failure Mother   . Diabetes Mother   . Stroke Sister     Prior to Admission medications   Medication Sig Start Date End Date Taking? Authorizing Provider  acetaminophen (TYLENOL) 325 MG tablet Take 2 tablets (650 mg total) by mouth every 6 (six) hours as needed for mild pain (or Fever >/= 101). 08/21/18   Angiulli, Lavon Paganini, PA-C  apixaban (ELIQUIS) 2.5 MG TABS tablet Take 1 tablet (2.5 mg total) by mouth 2 (two) times daily for 30 days. 08/21/18 09/20/18  Angiulli, Lavon Paganini, PA-C  atorvastatin (LIPITOR) 20 MG tablet Take 1 tablet (20 mg total) by mouth daily. 08/21/18   Angiulli, Lavon Paganini, PA-C  Calcium Carbonate-Vitamin D (CALTRATE 600+D) 600-400 MG-UNIT per tablet Take 1 tablet by mouth daily.      [provider]  carvedilol (COREG) 3.125 MG tablet Take 1 tablet (3.125 mg total) by mouth 2 (two) times daily with a meal. 08/21/18   Angiulli, Lavon Paganini, PA-C  Cholecalciferol 25 MCG (1000 UT) capsule Take 1 capsule (1,000 Units total) by mouth daily. 08/21/18   Angiulli, Lavon Paganini, PA-C  esomeprazole (NEXIUM) 40 MG capsule Take 1 capsule (40 mg total) by mouth daily at 12 noon. 08/21/18   Angiulli, Lavon Paganini, PA-C  etanercept (ENBREL) 50 MG/ML injection Inject 50 mg into the skin once a week.    [provider]  insulin aspart (NOVOLOG) 100 UNIT/ML FlexPen Inject 7 Units into the skin 3  (three) times daily with meals. 08/21/18   Angiulli, Lavon Paganini, PA-C  insulin NPH Human (NOVOLIN N) 100 UNIT/ML injection Inject 0.05 mLs (5 Units total) into the skin 2 (two) times daily before a meal. 08/21/18   Angiulli, Lavon Paganini, PA-C  Multiple Vitamins-Minerals (CENTRUM SILVER ULTRA WOMENS) TABS Take 1 tablet by mouth daily.     [provider]  nitroGLYCERIN (NITROSTAT) 0.4 MG SL tablet 1 TABLET UNDER TONGUE AT ONSET OF CHEST PAIN YOU MAY REPEAT EVERY 5 MINUTES FOR UP TO 3 DOSES. Patient taking differently: Place 0.4 mg under the tongue See admin instructions. 1 TABLET UNDER TONGUE AT ONSET OF CHEST PAIN YOU MAY REPEAT EVERY 5 MINUTES FOR UP TO 3 DOSES. 04/22/18   Nahser, Wonda Cheng, MD  Omega-3 Fatty Acids (FISH OIL) 1200 MG CAPS Take 1 capsule by mouth daily at 12 noon.    [provider]  oxyCODONE (OXY IR/ROXICODONE) 5 MG immediate release tablet Take 1 tablet (5 mg total) by mouth every 6 (six) hours as needed for moderate pain. 08/21/18   Angiulli, Lavon Paganini, PA-C  predniSONE (DELTASONE) 5 MG tablet Take 5 mg by mouth daily. 08/31/16   [provider]    Physical Exam: Vitals:   08/27/18 1415 08/27/18 1451 08/27/18 1500 08/27/18 1614  BP: 95/84 (!) 128/52 (!) 133/52 136/60  Pulse: 88 89 87 87  Resp: 15 (!) 22 (!) 22 20  Temp:    97.9 F (36.6 C)  TempSrc:    Oral  SpO2: 99% 100% 100% 100%     . General:  Appears calm and comfortable and is NAD . Eyes:  PERRL, EOMI, normal lids, scleral injection . ENT:  grossly normal hearing, lips & tongue, mildly dry mm; artificial dentition . Neck:  no LAD, masses or thyromegaly . Cardiovascular:  RRR, no m/r/g. No LE edema.  Marland Kitchen Respiratory:   CTA bilaterally with no wheezes/rales/rhonchi.  Normal respiratory effort. . Abdomen:  soft, diffusely TTP, ND, NABS . Skin:  no rash or induration seen on limited exam . Musculoskeletal:  grossly normal tone BUE/BLE, good ROM, no bony abnormality; hand deviations c/w RA .  Psychiatric:  Blunted mood and affect, speech fluent and appropriate, AOx3 . Neurologic:  CN 2-12 grossly intact, moves all extremities in coordinated fashion, sensation intact    Radiological Exams on Admission: Ct Abdomen Pelvis Wo Contrast  Result Date: 08/27/2018 CLINICAL DATA:  Abdominal pain, distention, constipation and diarrhea EXAM: CT ABDOMEN AND PELVIS WITHOUT CONTRAST TECHNIQUE: Multidetector CT imaging of the abdomen and pelvis was performed following the standard protocol without IV contrast. COMPARISON:  07/24/2018 FINDINGS: Lower chest: No acute abnormality. Hepatobiliary: No solid liver abnormality is seen. No gallstones, gallbladder wall thickening, or biliary dilatation. Pancreas: Unremarkable. No pancreatic ductal dilatation or surrounding inflammatory changes. Spleen: Normal in size without significant abnormality. Adrenals/Urinary Tract: Adrenal glands are unremarkable. Kidneys are normal, without renal calculi, solid lesion, or hydronephrosis. Bladder is unremarkable. Stomach/Bowel: Stomach is within normal limits. Appendix appears normal. The colon is diffusely thickened with surrounding inflammatory fat stranding. Sigmoid diverticulosis. Vascular/Lymphatic: Aortic atherosclerosis. No enlarged abdominal or pelvic lymph nodes. Reproductive: No mass or other significant abnormality. Other: No abdominal wall hernia or abnormality. Small volume ascites. Musculoskeletal: No acute or significant osseous findings. IMPRESSION: 1. The colon is diffusely thickened with surrounding inflammatory fat stranding. Findings are worsened in comparison to prior examination dated 07/24/2018 and remain consistent with nonspecific infectious, inflammatory, or ischemic colitis. 2. Small volume ascites, likely reactive, and new compared to prior examination. 3.  Aortic atherosclerosis. Electronically Signed   By: Eddie Candle M.D.   On: 08/27/2018 10:25    EKG: Independently reviewed.  Sinus arrhythmia  with rate 90; no evidence of acute ischemia   Labs on Admission: I have personally reviewed the available labs and imaging studies at the time of the admission.  Pertinent  labs:   CO2 16 Glucose 120 BUN 37/Creatinine 1.95/GFR 23; 36/1.12/45 on 6/19 Calcium 6.7 Albumin 2.0 WBC 15.1; 5.8 on 6/19 Hgb 9.1 - stable COVID negative C diff positive on 5/21; negative on 6/15; POSITIVE today Heme positive on 6/11, negative on 6/9  Assessment/Plan Principal Problem:   C. difficile colitis Active Problems:   Rheumatoid arthritis ([place])   Mixed hyperlipidemia   AKI (acute kidney injury) ([place])   Diabetes mellitus type 2 in nonobese (HCC)   PAF (paroxysmal atrial fibrillation) (HCC)   Hypoalbuminemia due to protein-calorie malnutrition ([place])   Essential hypertension    Recurrent C diff colitis -Patient was admitted from 5/21-6/3 for septic shock due to C diff -She was discharged to rehab and has done well until the last 2-3 days -Repeat abdominal cramping with refractory diarrhea and incontinence -Patient discussed with ID -Will treat with PO Vancomycin 500 mg BID -ID will consult and do prolonged taper -Fecal transplant may start back in July; can consider if patient continues to have recurrent/refractory issues -She did have hypotension that was persistent at the time of admission; this is thought to be from hypovolemia in the setting of excessive GI losses rather than sepsis and has resolved with IVF repletion -Will continue IVF for now due to ongoing GI upset -NPO for now, but can progress diet once her symptoms start to improve  AKI on stage 3 CKD -Creatinine 1.12 on 6/19 -Current creatinine is 1.95 -As noted above, this is very likely related to prerenal azotemia in the setting of decreased PO intake and excessive GI losses  Afib, on Eliquis -Diagnosed with last hospitalization -She is rate controlled on Coreg, but this will be held for now given persistent hypotension while  in the ER -It may be reasonable to resume Coreg tomorrow -Continue Eliquis  HTN -Hold Coreg for now based on hypotension in the ER -Consider resumption of Coreg tomorrow  DM -A1c was 8.8 on 5/21 -Will hold NPH for now while NPO  -Will cover with sensitive-scale SSI  HLD -Continue Lipitor  Malnutrition -She may benefit from nutrition consultation once she is eating  RA -Enbrel has been held since before last hospitalization, roughly 5/15 -Prednisone was recently increased from 5 to 10 mg daily -For now, will use stress-dosed steroids with hydrocortisone 50 mg IV q6h, as patient is NPO    Note: This patient has been tested and is negative for the novel coronavirus COVID-19.  DVT prophylaxis: Eliquis Code Status:  DNR - confirmed with patient/family Family Communication: Daughter was present throughout hospitalization  Disposition Plan:  Home once clinically improved Consults called: ID; consider GI  Admission status: Admit - It is my clinical opinion that admission to INPATIENT is reasonable and necessary because of the expectation that this patient will require hospital care that crosses at least 2 midnights to treat this condition based on the medical complexity of the problems presented.  Given the aforementioned information, the predictability of an adverse outcome is felt to be significant.    Karmen Bongo MD [facility]   How to contact the [facility] Attending or Consulting provider [place] or covering provider during after hours [place], for this patient?  1. Check the care team in [facility] and look for a) attending/consulting [facility] provider listed and b) the [facility] team listed 2. Log into [email or web address] and use [place]'s universal password to access. If you do not have the password, please contact the hospital operator. 3. Locate the [facility] provider you are  looking for under [facility] and page to a number that you can be directly reached. 4. If you still have  difficulty reaching the provider, please page the [facility] (Director on Call) for the Hospitalists listed on amion for assistance.   08/27/2018, 5:32 PM

## 2018-08-27 NOTE — ED Provider Notes (Addendum)
[facility] Provider Note   CSN: [id number] Arrival date & time: 08/27/18  0754    History   Chief Complaint Chief Complaint  Patient presents with   Abdominal Pain   Diarrhea    HPI Shelby Walker is a 85 y.o. female presented today for abdominal pain and diarrhea.  Patient was released from the hospital 4 days ago after a month-long admission following diverticulitis and subsequent C. difficile infection.  Patient has been feeling improved however began having diarrhea again 2-3 days ago she started oral vancomycin yesterday without improvement.  Patient endorses sharp cramping abdominal pain to her lower abdomen worsened with diarrhea and without alleviating factors, pain waxes and wanes but has been constant for the past 2 days.  Endorses mucousy, nonbloody diarrhea, nausea without vomiting, generalized weakness.  No fever/chills, dysuria/hematuria, chest pain/shortness of breath, cough or any additional concerns.     HPI  Past Medical History:  Diagnosis Date   Bruit    Carotid Doppler showed no significant abnormality     9per patient)   C. difficile colitis 07/2018   with severe sepsis   Chest pain, unspecified    Nuclear, May, 2008, no scar or ischemia   Diabetes mellitus    Dyslipidemia    Ejection fraction    EF 55-60%, echo, February, 2011   GERD (gastroesophageal reflux disease)    Mitral regurgitation April 21, 2009   mild,  echo, February, 2011   Osteoporosis    Palpitations    possible very brief atrial fibrillation on monitor and possible reentrant tachycardia   Psoriasis    Rheumatoid arthritis ([place])     Patient Active Problem List   Diagnosis Date Noted   C. difficile colitis 08/27/2018   Thrombocytopenia ([place])    Diarrhea of presumed infectious origin    Essential hypertension    Bradycardia    Hypotension due to drugs    Shortness of breath    Hypophosphatemia    Chest discomfort      Pain    Hypokalemia    Steroid-induced hyperglycemia    Acute blood loss anemia    Leukocytosis    Hypoalbuminemia due to protein-calorie malnutrition ([place])    Debility 08/06/2018   AKI (acute kidney injury) ([place])    Diabetes mellitus type 2 in nonobese [facility])    New onset atrial fibrillation ([place])    Shock ([place]) 07/30/2018   Diverticulitis 07/24/2018   Colitis presumed infectious 07/24/2018   Colitis 07/24/2018   Clostridium difficile colitis 07/24/2018   Mixed hyperlipidemia 09/29/2016   Stable angina ([place]) 09/29/2016   Claudication in peripheral vascular disease ([place]) 08/09/2016   Preoperative clearance 05/21/2012   Precordial chest pain    Palpitations    Diabetes mellitus ([facility])    GERD (gastroesophageal reflux disease)    Ejection fraction    Carotid artery disease ([place])    Mitral regurgitation 04/21/2009   PSORIASIS 02/09/2009   Rheumatoid arthritis ([place]) 02/09/2009    Past Surgical History:  Procedure Laterality Date   FRACTURE SURGERY       OB History    Gravida  2   Para  2   Term      Preterm      AB      Living        SAB      TAB      Ectopic      Multiple      Live Births  Home Medications    Prior to Admission medications   Medication Sig Start Date End Date Taking? Authorizing Provider  acetaminophen (TYLENOL) 325 MG tablet Take 2 tablets (650 mg total) by mouth every 6 (six) hours as needed for mild pain (or Fever >/= 101). 08/21/18  Yes Angiulli, Lavon Paganini, PA-C  apixaban (ELIQUIS) 2.5 MG TABS tablet Take 1 tablet (2.5 mg total) by mouth 2 (two) times daily for 30 days. 08/21/18 09/20/18 Yes Angiulli, Lavon Paganini, PA-C  atorvastatin (LIPITOR) 20 MG tablet Take 1 tablet (20 mg total) by mouth daily. Patient taking differently: Take 20 mg by mouth at bedtime.  08/21/18  Yes Angiulli, Lavon Paganini, PA-C  Calcium Carbonate-Vitamin D (CALTRATE 600+D) 600-400 MG-UNIT per tablet Take 1 tablet by mouth  daily at 12 noon.    Yes [provider]  carvedilol (COREG) 3.125 MG tablet Take 1 tablet (3.125 mg total) by mouth 2 (two) times daily with a meal. 08/21/18  Yes Angiulli, Lavon Paganini, PA-C  Cholecalciferol 25 MCG (1000 UT) capsule Take 1 capsule (1,000 Units total) by mouth daily. 08/21/18  Yes Angiulli, Lavon Paganini, PA-C  esomeprazole (NEXIUM) 40 MG capsule Take 1 capsule (40 mg total) by mouth daily at 12 noon. Patient taking differently: Take 40 mg by mouth daily.  08/21/18  Yes Angiulli, Lavon Paganini, PA-C  etanercept (ENBREL) 50 MG/ML injection Inject 50 mg into the skin every Friday.    Yes [provider]  insulin NPH Human (NOVOLIN N) 100 UNIT/ML injection Inject 0.05 mLs (5 Units total) into the skin 2 (two) times daily before a meal. 08/21/18  Yes Angiulli, Lavon Paganini, PA-C  Multiple Vitamins-Minerals (CENTRUM SILVER 50+WOMEN PO) Take 1 tablet by mouth daily.   Yes [provider]  nitroGLYCERIN (NITROSTAT) 0.4 MG SL tablet 1 TABLET UNDER TONGUE AT ONSET OF CHEST PAIN YOU MAY REPEAT EVERY 5 MINUTES FOR UP TO 3 DOSES. Patient taking differently: Place 0.4 mg under the tongue See admin instructions. 1 TABLET UNDER TONGUE AT ONSET OF CHEST PAIN YOU MAY REPEAT EVERY 5 MINUTES FOR UP TO 3 DOSES. 04/22/18  Yes Nahser, Wonda Cheng, MD  Omega-3 Fatty Acids (FISH OIL) 1200 MG CAPS Take 1 capsule by mouth daily at 12 noon.   Yes [provider]  oxyCODONE (OXY IR/ROXICODONE) 5 MG immediate release tablet Take 1 tablet (5 mg total) by mouth every 6 (six) hours as needed for moderate pain. 08/21/18  Yes Angiulli, Lavon Paganini, PA-C  predniSONE (DELTASONE) 10 MG tablet Take 10 mg by mouth daily.  08/31/16  Yes [provider]  Probiotic Product (PROBIOTIC-10 PO) Take 1 capsule by mouth daily.   Yes [provider]  vancomycin (VANCOCIN) 125 MG capsule Take 125 mg by mouth 4 (four) times daily. 08/26/18 09/09/18 Yes [provider]    Family History Family History    Problem Relation Age of Onset   Heart failure Father    Heart attack Father    Diabetes Father    Heart attack Mother    Heart failure Mother    Diabetes Mother    Stroke Sister     Social History Social History   Tobacco Use   Smoking status: Never Smoker   Smokeless tobacco: Never Used  Substance Use Topics   Alcohol use: No    Alcohol/week: 0.0 standard drinks   Drug use: No     Allergies   Compazine [prochlorperazine edisylate] and Sulfonamide derivatives   Review of Systems Review of Systems  Constitutional: Positive for fatigue. Negative for chills and fever.  Gastrointestinal: Positive for abdominal pain, diarrhea and nausea. Negative for blood in stool and vomiting.  Genitourinary: Negative.  Negative for dysuria and hematuria.  Neurological: Positive for weakness (Generalized). Negative for headaches.  All other systems reviewed and are negative.   Physical Exam Updated Vital Signs BP (!) 100/48    Pulse 87    Temp 98.4 F (36.9 C) (Oral)    Resp (!) 21    SpO2 100%   Physical Exam Constitutional:      General: She is not in acute distress.    Appearance: Normal appearance. She is not ill-appearing or diaphoretic.     Comments: Elderly, frail-appearing  HENT:     Head: Normocephalic and atraumatic.     Right Ear: External ear normal.     Left Ear: External ear normal.     Nose: Nose normal.  Eyes:     General: Vision grossly intact. Gaze aligned appropriately.     Pupils: Pupils are equal, round, and reactive to light.  Neck:     Musculoskeletal: Normal range of motion.     Trachea: Trachea and phonation normal. No tracheal deviation.  Cardiovascular:     Rate and Rhythm: Normal rate and regular rhythm.  Pulmonary:     Effort: Pulmonary effort is normal. No respiratory distress.     Breath sounds: Normal breath sounds.  Abdominal:     General: There is no distension.     Palpations: Abdomen is soft.     Tenderness: There is  generalized abdominal tenderness and tenderness in the right lower quadrant, suprapubic area and left lower quadrant. There is no guarding or rebound.  Musculoskeletal: Normal range of motion.  Skin:    General: Skin is warm and dry.          Comments: Superficial skin breakdown right gluteal and superior gluteal cleft without signs of infection.  Neurological:     Mental Status: She is alert.     GCS: GCS eye subscore is 4. GCS verbal subscore is 5. GCS motor subscore is 6.     Comments: Speech is clear and goal oriented, follows commands Major Cranial nerves without deficit, no facial droop Moves extremities without ataxia, coordination intact  Psychiatric:        Behavior: Behavior normal.    ED Treatments / Results  Labs (all labs ordered are listed, but only abnormal results are displayed) Labs Reviewed  CBC WITH DIFFERENTIAL/PLATELET - Abnormal; Notable for the following components:      Result Value   WBC 15.1 (*)    RBC 3.14 (*)    Hemoglobin 9.1 (*)    HCT 29.3 (*)    RDW 17.4 (*)    Neutro Abs 11.9 (*)    Abs Immature Granulocytes 0.20 (*)    All other components within normal limits  COMPREHENSIVE METABOLIC PANEL - Abnormal; Notable for the following components:   CO2 16 (*)    Glucose, Bld 120 (*)    BUN 37 (*)    Creatinine, Ser 1.95 (*)    Calcium 6.7 (*)    Total Protein 4.9 (*)    Albumin 2.0 (*)    AST 12 (*)    GFR calc non Af Amer 23 (*)    GFR calc Af Amer 27 (*)    All other components within normal limits  SARS CORONAVIRUS 2 (HOSPITAL ORDER, [facility])  GASTROINTESTINAL PANEL BY  PCR, STOOL (REPLACES STOOL CULTURE)  C DIFFICILE QUICK SCREEN W PCR REFLEX  CULTURE, BLOOD (ROUTINE X 2)  CULTURE, BLOOD (ROUTINE X 2)  LIPASE, BLOOD  CALCIUM, IONIZED    EKG EKG Interpretation  Date/Time:  Wednesday August 27 2018 08:13:53 EDT Ventricular Rate:  90 PR Interval:    QRS Duration: 95 QT Interval:  374 QTC  Calculation: 458 R Axis:   66 Text Interpretation:  Sinus arrhythmia Confirmed by Virgel Manifold [phone]) on 08/27/2018 8:22:04 AM   Radiology Ct Abdomen Pelvis Wo Contrast  Result Date: 08/27/2018 CLINICAL DATA:  Abdominal pain, distention, constipation and diarrhea EXAM: CT ABDOMEN AND PELVIS WITHOUT CONTRAST TECHNIQUE: Multidetector CT imaging of the abdomen and pelvis was performed following the standard protocol without IV contrast. COMPARISON:  07/24/2018 FINDINGS: Lower chest: No acute abnormality. Hepatobiliary: No solid liver abnormality is seen. No gallstones, gallbladder wall thickening, or biliary dilatation. Pancreas: Unremarkable. No pancreatic ductal dilatation or surrounding inflammatory changes. Spleen: Normal in size without significant abnormality. Adrenals/Urinary Tract: Adrenal glands are unremarkable. Kidneys are normal, without renal calculi, solid lesion, or hydronephrosis. Bladder is unremarkable. Stomach/Bowel: Stomach is within normal limits. Appendix appears normal. The colon is diffusely thickened with surrounding inflammatory fat stranding. Sigmoid diverticulosis. Vascular/Lymphatic: Aortic atherosclerosis. No enlarged abdominal or pelvic lymph nodes. Reproductive: No mass or other significant abnormality. Other: No abdominal wall hernia or abnormality. Small volume ascites. Musculoskeletal: No acute or significant osseous findings. IMPRESSION: 1. The colon is diffusely thickened with surrounding inflammatory fat stranding. Findings are worsened in comparison to prior examination dated 07/24/2018 and remain consistent with nonspecific infectious, inflammatory, or ischemic colitis. 2. Small volume ascites, likely reactive, and new compared to prior examination. 3.  Aortic atherosclerosis. Electronically Signed   By: Eddie Candle M.D.   On: 08/27/2018 10:25    Procedures Procedures (including critical care time)  Medications Ordered in ED Medications  vancomycin (VANCOCIN)  50 mg/mL oral solution 125 mg (125 mg Oral Given 08/27/18 1153)  morphine 2 MG/ML injection 2 mg (has no administration in time range)  atorvastatin (LIPITOR) tablet 20 mg (has no administration in time range)  hydrocortisone sodium succinate (SOLU-CORTEF) 100 MG injection 50 mg (has no administration in time range)  apixaban (ELIQUIS) tablet 2.5 mg (has no administration in time range)  acetaminophen (TYLENOL) tablet 650 mg (has no administration in time range)    Or  acetaminophen (TYLENOL) suppository 650 mg (has no administration in time range)  ondansetron (ZOFRAN) tablet 4 mg (has no administration in time range)    Or  ondansetron (ZOFRAN) injection 4 mg (has no administration in time range)  sodium chloride flush (NS) 0.9 % injection 3 mL (has no administration in time range)  0.9 %  sodium chloride infusion (has no administration in time range)  insulin aspart (novoLOG) injection 0-9 Units (has no administration in time range)  insulin aspart (novoLOG) injection 0-5 Units (has no administration in time range)  morphine 2 MG/ML injection 2 mg (0.5 mg Intravenous Given 08/27/18 0848)  sodium chloride 0.9 % bolus 500 mL (0 mLs Intravenous Stopped 08/27/18 0958)  sodium chloride 0.9 % bolus 1,000 mL (1,000 mLs Intravenous New Bag/Given 08/27/18 1029)  sodium chloride 0.9 % bolus 1,000 mL (0 mLs Intravenous Stopped 08/27/18 1144)     Initial Impression / Assessment and Plan / ED Course  I have reviewed the triage vital signs and the nursing notes.  Pertinent labs & imaging results that were available during my care of the patient were  reviewed by me and considered in my medical decision making (see chart for details).  Clinical Course as of Aug 27 1235  Wed Aug 27, 2018  1058 Discussed with hospitalist Dr. Lorin Mercy   [BM]    Clinical Course User Index [BM] Deliah Boston, PA-C   Initial examination, elderly frail-appearing female with generalized abdominal pain worse in the lower  quadrants, fatigue and generalized weakness for the past few days, nausea without vomiting.  Will obtain abdominal labs, stool sample with C. difficile PCR, based on severity of patient's pain there is concern for possible recurrent diverticulitis will obtain CT abdomen pelvis for evaluation of infection. - CBC with new leukocytosis of 15.1 with left shift, hemoglobin 9.1 appears baseline CMP with worsening of kidney function, creatinine 1.95, will give fluid bolus Lipase within normal limits COVID-19 negative Urinalysis pending GI panel pending C. difficile panel pending  CT abdomen pelvis   IMPRESSION:  1. The colon is diffusely thickened with surrounding inflammatory  fat stranding. Findings are worsened in comparison to prior  examination dated 07/24/2018 and remain consistent with nonspecific  infectious, inflammatory, or ischemic colitis.    2. Small volume ascites, likely reactive, and new compared to prior  examination.    3. Aortic atherosclerosis.   EKG: Sinus arrhythmia Confirmed by Virgel Manifold  - Originally ordered 2 mg morphine for patient's abdominal pain however was informed by nurse that patient with soft blood pressures and 1.5 mg was held and wasted.  Patient was ordered additional fluid bolus for hypotension.  I then reevaluated the patient she is mentating appropriately resting comfortably and in no acute distress denies abdominal pain at this time.  With leukocytosis, AKI, colitis on CT scan we will seek admission at this time.  Oral vancomycin ordered for treatment of presumptive C. Difficile. Suspect hypotension secondary to volume depletion at this time. - Discussed case with hospitalist Dr. Lorin Mercy who will be seeing patient for admission. - Patient again reevaluated resting comfortably no acute distress, no change in presentation, mentating appropriately, pleasant.  Daughter at bedside.  They are agreeable for plan for admission at this time. - Patient has  been admitted to hospital service for further evaluation and management.  Case was discussed with Dr. Wilson Singer throughout this visit who agrees with work-up and admission at this time.  Note: Portions of this report may have been transcribed using voice recognition software. Every effort was made to ensure accuracy; however, inadvertent computerized transcription errors may still be present. Final Clinical Impressions(s) / ED Diagnoses   Final diagnoses:  Colitis  Dehydration  AKI (acute kidney injury) [facility])    ED Discharge Orders    None       Gari Crown 08/27/18 1137    [address] 08/27/18 1238    Virgel Manifold, MD 08/27/18 1338

## 2018-08-27 NOTE — ED Notes (Signed)
Pt changed- barrier cream applied and sacral heart. Meds given. Pt appreciative and dtr remains at bedside.

## 2018-08-27 NOTE — ED Notes (Signed)
admitting at bedside.  

## 2018-08-27 NOTE — Progress Notes (Signed)
Pt NPO for bowel rest but has PO meds due. NP Baltazar Najjar paged and notified. Will hold PO meds tonight per order.

## 2018-08-28 ENCOUNTER — Telehealth: Payer: Self-pay | Admitting: Physical Medicine & Rehabilitation

## 2018-08-28 DIAGNOSIS — D72829 Elevated white blood cell count, unspecified: Secondary | ICD-10-CM

## 2018-08-28 DIAGNOSIS — L8990 Pressure ulcer of unspecified site, unspecified stage: Secondary | ICD-10-CM | POA: Insufficient documentation

## 2018-08-28 DIAGNOSIS — N179 Acute kidney failure, unspecified: Secondary | ICD-10-CM

## 2018-08-28 LAB — BASIC METABOLIC PANEL
Anion gap: 14 (ref 5–15)
Anion gap: 14 (ref 5–15)
BUN: 29 mg/dL — ABNORMAL HIGH (ref 8–23)
BUN: 31 mg/dL — ABNORMAL HIGH (ref 8–23)
CO2: 9 mmol/L — ABNORMAL LOW (ref 22–32)
CO2: 13 mmol/L — ABNORMAL LOW (ref 22–32)
Calcium: 6 mg/dL — CL (ref 8.9–10.3)
Calcium: 6.1 mg/dL — CL (ref 8.9–10.3)
Chloride: 117 mmol/L — ABNORMAL HIGH (ref 98–111)
Chloride: 117 mmol/L — ABNORMAL HIGH (ref 98–111)
Creatinine, Ser: 1.54 mg/dL — ABNORMAL HIGH (ref 0.44–1.00)
Creatinine, Ser: 1.66 mg/dL — ABNORMAL HIGH (ref 0.44–1.00)
GFR calc Af Amer: 36 mL/min — ABNORMAL LOW (ref >60)
GFR calc Af Amer: 33 mL/min — ABNORMAL LOW (ref >60)
GFR calc non Af Amer: 31 mL/min — ABNORMAL LOW (ref >60)
GFR calc non Af Amer: 28 mL/min — ABNORMAL LOW (ref >60)
Glucose, Bld: 179 mg/dL — ABNORMAL HIGH (ref 70–99)
Glucose, Bld: 216 mg/dL — ABNORMAL HIGH (ref 70–99)
Potassium: 4.5 mmol/L (ref 3.5–5.1)
Potassium: 3.8 mmol/L (ref 3.5–5.1)
Sodium: 140 mmol/L (ref 135–145)
Sodium: 144 mmol/L (ref 135–145)

## 2018-08-28 LAB — GASTROINTESTINAL PANEL BY PCR, STOOL (REPLACES STOOL CULTURE)

## 2018-08-28 LAB — GLUCOSE, CAPILLARY
Glucose-Capillary: 173 mg/dL — ABNORMAL HIGH (ref 70–99)
Glucose-Capillary: 180 mg/dL — ABNORMAL HIGH (ref 70–99)
Glucose-Capillary: 180 mg/dL — ABNORMAL HIGH (ref 70–99)
Glucose-Capillary: 187 mg/dL — ABNORMAL HIGH (ref 70–99)

## 2018-08-28 LAB — CBC
HCT: 29.5 % — ABNORMAL LOW (ref 36.0–46.0)
Hemoglobin: 9.1 g/dL — ABNORMAL LOW (ref 12.0–15.0)
MCH: 28.8 pg (ref 26.0–34.0)
MCHC: 30.8 g/dL (ref 30.0–36.0)
MCV: 93.4 fL (ref 80.0–100.0)
Platelets: 357 10*3/uL (ref 150–400)
RBC: 3.16 MIL/uL — ABNORMAL LOW (ref 3.87–5.11)
RDW: 17.2 % — ABNORMAL HIGH (ref 11.5–15.5)
WBC: 13.7 10*3/uL — ABNORMAL HIGH (ref 4.0–10.5)
nRBC: 0 % (ref 0.0–0.2)

## 2018-08-28 LAB — ALBUMIN: Albumin: 2.1 g/dL — ABNORMAL LOW (ref 3.5–5.0)

## 2018-08-28 LAB — CALCIUM, IONIZED: Calcium, Ionized, Serum: 3.7 mg/dL — ABNORMAL LOW (ref 4.5–5.6)

## 2018-08-28 MED ORDER — CALCIUM GLUCONATE-NACL 1-0.675 GM/50ML-% IV SOLN
1.0000 g | Freq: Once | INTRAVENOUS | Status: AC
  Administered 2018-08-28: 1000 mg via INTRAVENOUS
  Filled 2018-08-28: qty 50

## 2018-08-28 MED ORDER — SODIUM BICARBONATE 8.4 % IV SOLN
50.0000 meq | Freq: Once | INTRAVENOUS | Status: AC
  Administered 2018-08-28: 50 meq via INTRAVENOUS
  Filled 2018-08-28: qty 50

## 2018-08-28 MED ORDER — RAMELTEON 8 MG PO TABS
8.0000 mg | ORAL_TABLET | Freq: Once | ORAL | Status: AC
  Administered 2018-08-28: 8 mg via ORAL
  Filled 2018-08-28: qty 1

## 2018-08-28 MED ORDER — SODIUM CHLORIDE 0.9 % IV SOLN
INTRAVENOUS | Status: DC
  Administered 2018-08-28 – 2018-09-02 (×6): via INTRAVENOUS

## 2018-08-28 MED ORDER — VANCOMYCIN 50 MG/ML ORAL SOLUTION: 125.0000 mg | Freq: Two times a day (BID) | ORAL | Status: DC

## 2018-08-28 MED ORDER — VANCOMYCIN 50 MG/ML ORAL SOLUTION: 125.0000 mg | ORAL | Status: DC

## 2018-08-28 MED ORDER — VANCOMYCIN 50 MG/ML ORAL SOLUTION: 125.0000 mg | Freq: Every day | ORAL | Status: DC

## 2018-08-28 MED ORDER — VANCOMYCIN 50 MG/ML ORAL SOLUTION
125.0000 mg | Freq: Four times a day (QID) | ORAL | Status: DC
  Administered 2018-08-28 – 2018-09-03 (×25): 125 mg via ORAL
  Filled 2018-08-28 (×26): qty 2.5

## 2018-08-28 NOTE — Telephone Encounter (Signed)
I just got off the phone with Shelby Walker. She is the daughter of Luria Rosario and she is wondering if she needs the appt with Dr. Posey Pronto. She is currently back in the hospital. She has an appt on 10/01/18. She wants a call back at [phone].

## 2018-08-28 NOTE — Progress Notes (Signed)
[place] [facility] for Infectious Disease  Date of Admission:  08/27/2018     Total days of antibiotics 2         ASSESSMENT/PLAN  Shelby Walker is an 85 year old female recently hospitalized on 5/20 with severe C. difficile infection status post 14-day treatment with 500 mg of vancomycin discharged from a rehabilitation and now returning with recurrent diarrhea and positive for C. difficile colitis.  C. difficile colitis -abdominal pains improved today.  Has not had a bowel movement from what she can tell.  She has missed 1 dose of vancomycin overnight secondary to being n.p.o.  Discussed importance of continuing to take vancomycin.  Continue current dose of 125 mg vancomycin orally for 10 days and then taper.   Principal Problem:   C. difficile colitis Active Problems:   Rheumatoid arthritis ([place])   Mixed hyperlipidemia   AKI (acute kidney injury) ([place])   Diabetes mellitus type 2 in nonobese (HCC)   PAF (paroxysmal atrial fibrillation) (HCC)   Hypoalbuminemia due to protein-calorie malnutrition ([place])   Essential hypertension   Pressure injury of skin   . apixaban  2.5 mg Oral BID  . atorvastatin  20 mg Oral QHS  . hydrocortisone sod succinate (SOLU-CORTEF) inj  50 mg Intravenous Q6H  . insulin aspart  0-5 Units Subcutaneous QHS  . insulin aspart  0-9 Units Subcutaneous TID WC  . sodium chloride flush  3 mL Intravenous Q12H  . vancomycin  125 mg Oral QID   Followed by  . [START ON 09/11/2018] vancomycin  125 mg Oral BID   Followed by  . [START ON 09/18/2018] vancomycin  125 mg Oral Daily   Followed by  . [START ON 09/25/2018] vancomycin  125 mg Oral QODAY   Followed by  . [START ON 10/03/2018] vancomycin  125 mg Oral Q3 days    SUBJECTIVE:  Afebrile overnight with no acute events. Did miss a dose of vancomycin. Feeling better today. Has a small amount of bowel movement with urination. Unsure if she wants to eat because afraid of diarrhea.   Allergies  Allergen Reactions  .  Compazine [Prochlorperazine Edisylate] Other (See Comments)    REACTION: " tongue swell and unable to swallow"  . Sulfonamide Derivatives Other (See Comments)    REACTION: " broke out with fine itching bumps"     Review of Systems: Review of Systems  Constitutional: Negative for chills, fever and weight loss.  Respiratory: Negative for cough, shortness of breath and wheezing.   Cardiovascular: Negative for chest pain and leg swelling.  Gastrointestinal: Negative for abdominal pain, constipation, diarrhea, nausea and vomiting.  Skin: Negative for rash.      OBJECTIVE: Vitals:   08/27/18 1614 08/27/18 2107 08/28/18 0621 08/28/18 0923  BP: 136/60 (!) 107/51 (!) 112/95 117/60  Pulse: 87 80 84 88  Resp: 20     Temp: 97.9 F (36.6 C) 98 F (36.7 C) 98 F (36.7 C) 98 F (36.7 C)  TempSrc: Oral Oral Oral Oral  SpO2: 100% 99% 100% 100%   There is no height or weight on file to calculate BMI.  Physical Exam Constitutional:      General: She is not in acute distress.    Appearance: She is well-developed.     Comments: Lying in bed with head of bed elevated; pleasant.   Cardiovascular:     Rate and Rhythm: Normal rate and regular rhythm.     Heart sounds: Normal heart sounds.  Pulmonary:  Effort: Pulmonary effort is normal.     Breath sounds: Normal breath sounds.  Abdominal:     General: Bowel sounds are normal.     Tenderness: There is generalized abdominal tenderness.  Skin:    General: Skin is warm and dry.  Neurological:     Mental Status: She is alert.  Psychiatric:        Mood and Affect: Mood is anxious.        Thought Content: Thought content normal.        Judgment: Judgment normal.     Lab Results Lab Results  Component Value Date   WBC 13.7 (H) 08/28/2018   HGB 9.1 (L) 08/28/2018   HCT 29.5 (L) 08/28/2018   MCV 93.4 08/28/2018   PLT 357 08/28/2018    Lab Results  Component Value Date   CREATININE 1.54 (H) 08/28/2018   BUN 29 (H) 08/28/2018    NA 140 08/28/2018   K 4.5 08/28/2018   CL 117 (H) 08/28/2018   CO2 9 (L) 08/28/2018    Lab Results  Component Value Date   ALT 9 08/27/2018   AST 12 (L) 08/27/2018   ALKPHOS 57 08/27/2018   BILITOT 0.7 08/27/2018     Microbiology: Recent Results (from the past 240 hour(s))  SARS Coronavirus 2 (CEPHEID - Performed in [facility] hospital lab), Hosp Order     Status: None   Collection Time: 08/27/18  9:28 AM   Specimen: Nasopharyngeal Swab  Result Value Ref Range Status   SARS Coronavirus 2 NEGATIVE NEGATIVE Final    Comment: (NOTE) If result is NEGATIVE SARS-CoV-2 target nucleic acids are NOT DETECTED. The SARS-CoV-2 RNA is generally detectable in upper and lower  respiratory specimens during the acute phase of infection. The lowest  concentration of SARS-CoV-2 viral copies this assay can detect is 250  copies / mL. A negative result does not preclude SARS-CoV-2 infection  and should not be used as the sole basis for treatment or other  patient management decisions.  A negative result may occur with  improper specimen collection / handling, submission of specimen other  than nasopharyngeal swab, presence of viral mutation(s) within the  areas targeted by this assay, and inadequate number of viral copies  (<250 copies / mL). A negative result must be combined with clinical  observations, patient history, and epidemiological information. If result is POSITIVE SARS-CoV-2 target nucleic acids are DETECTED. The SARS-CoV-2 RNA is generally detectable in upper and lower  respiratory specimens dur ing the acute phase of infection.  Positive  results are indicative of active infection with SARS-CoV-2.  Clinical  correlation with patient history and other diagnostic information is  necessary to determine patient infection status.  Positive results do  not rule out bacterial infection or co-infection with other viruses. If result is PRESUMPTIVE POSTIVE SARS-CoV-2 nucleic acids MAY BE  PRESENT.   A presumptive positive result was obtained on the submitted specimen  and confirmed on repeat testing.  While 2019 novel coronavirus  (SARS-CoV-2) nucleic acids may be present in the submitted sample  additional confirmatory testing may be necessary for epidemiological  and / or clinical management purposes  to differentiate between  SARS-CoV-2 and other Sarbecovirus currently known to infect humans.  If clinically indicated additional testing with an alternate test  methodology [phone]) is advised. The SARS-CoV-2 RNA is generally  detectable in upper and lower respiratory sp ecimens during the acute  phase of infection. The expected result is Negative. Fact Sheet for  Patients:  StrictlyIdeas.no Fact Sheet for Healthcare Providers: BankingDealers.co.za This test is not yet approved or cleared by the [place] FDA and has been authorized for detection and/or diagnosis of SARS-CoV-2 by FDA under an Emergency Use Authorization (EUA).  This EUA will remain in effect (meaning this test can be used) for the duration of the COVID-19 declaration under Section 564(b)(1) of the Act, 21 U.S.C. section 360bbb-3(b)(1), unless the authorization is terminated or revoked sooner. Performed at [facility], [place] [address] [place], [place] [address]   Gastrointestinal Panel by PCR , Stool     Status: None   Collection Time: 08/27/18 12:00 PM   Specimen: Stool  Result Value Ref Range Status   Campylobacter species NOT DETECTED NOT DETECTED Final   Plesimonas shigelloides NOT DETECTED NOT DETECTED Final   Salmonella species NOT DETECTED NOT DETECTED Final   Yersinia enterocolitica NOT DETECTED NOT DETECTED Final   Vibrio species NOT DETECTED NOT DETECTED Final   Vibrio cholerae NOT DETECTED NOT DETECTED Final   Enteroaggregative E coli (EAEC) NOT DETECTED NOT DETECTED Final   Enteropathogenic E coli (EPEC) NOT DETECTED NOT DETECTED  Final   Enterotoxigenic E coli (ETEC) NOT DETECTED NOT DETECTED Final   Shiga like toxin producing E coli (STEC) NOT DETECTED NOT DETECTED Final   E. coli O157 NOT DETECTED NOT DETECTED Final   Shigella/Enteroinvasive E coli (EIEC) NOT DETECTED NOT DETECTED Final   Cryptosporidium NOT DETECTED NOT DETECTED Final   Cyclospora cayetanensis NOT DETECTED NOT DETECTED Final   Entamoeba histolytica NOT DETECTED NOT DETECTED Final   Giardia lamblia NOT DETECTED NOT DETECTED Final   Adenovirus F40/41 NOT DETECTED NOT DETECTED Final   Astrovirus NOT DETECTED NOT DETECTED Final   Norovirus GI/GII NOT DETECTED NOT DETECTED Final   Rotavirus A NOT DETECTED NOT DETECTED Final   Sapovirus (I, II, IV, and V) NOT DETECTED NOT DETECTED Final    Comment: Performed at [facility], [address] [place], [place] [address]  C Difficile Quick Screen w PCR reflex     Status: Abnormal   Collection Time: 08/27/18 12:00 PM   Specimen: Stool  Result Value Ref Range Status   C Diff antigen POSITIVE (A) NEGATIVE Final   C Diff toxin POSITIVE (A) NEGATIVE Final   C Diff interpretation Toxin producing C. difficile detected.  Final    Comment: CRITICAL RESULT CALLED TO, READ BACK BY AND VERIFIED WITH: Johnette Abraham Preyer RN 14:35 08/27/18 ([email or web address]) Performed at [facility], [place] [address], [place], [place] [address]   Blood Culture (routine x 2)     Status: None (Preliminary result)   Collection Time: 08/27/18  2:35 PM   Specimen: BLOOD RIGHT WRIST  Result Value Ref Range Status   Specimen Description BLOOD RIGHT WRIST  Final   Special Requests   Final    BOTTLES DRAWN AEROBIC ONLY Blood Culture results may not be optimal due to an inadequate volume of blood received in culture bottles   Culture   Final    NO GROWTH < 24 HOURS Performed at [facility], [place] [address], [place], [place] [address]    Report Status PENDING  Incomplete  Blood Culture (routine x 2)     Status: None (Preliminary  result)   Collection Time: 08/27/18  2:51 PM   Specimen: BLOOD LEFT FOREARM  Result Value Ref Range Status   Specimen Description BLOOD LEFT FOREARM  Final   Special Requests   Final  BOTTLES DRAWN AEROBIC ONLY Blood Culture adequate volume   Culture   Final    NO GROWTH < 24 HOURS Performed at [facility], [place] [address] [place], [place] [address]    Report Status PENDING  Incomplete     Terri Piedra, [place] [facility] [place] [phone] Pager  08/28/2018  12:34 PM

## 2018-08-28 NOTE — Progress Notes (Signed)
PROGRESS NOTE  Shelby Walker:[id number] DOB: 16-Jan-1934 DOA: 08/27/2018 PCP: Reynold Bowen, MD  Brief History    Shelby Walker is a 85 y.o. female with medical history significant of RA; HLD; and DM presenting with diarrhea.  Symptoms started May 6 - went to ER and diagnosed with diverticulitis.  Sent home with 2 antibiotics.  Slight improvement and then worsened.  On May 22, went back to [facility] and had + C  Diff testing, so admitted at [facility].  She ended up in ICU for about a week and then spent another week on the floor.  She then went to CIR for 2 weeks.  She went home on 6/20.  She was doing very well.  Late Sunday, she developed cramping abdominal pain and started with recurrent diarrhea.  She vomited twice on Monday.  The diarrhea worsened in frequency and amounts, with incontinence.  Slight fever to 100 one day but otherwise no fever.  [facility] came in early in the week and daughter has been closely tracking her vitals.  Her BP has bene low but not in the 80s - has mostly been 90/45.  Last night it was 98/50.   Her normal is maybe 116.  She is light-headed/dizzy with standing.  Last BM was about 630 this AM, none in ER.  She was having them hourly this AM.  She was recommended to restart oral Vanc yesterday.  She tends to have stools with ambulation/transfers.  She is not on Enbrel right now and so rheum yesterday increased prednisone from 5 to 10 mg daily.  Last dose of Enbrel was about 5/15.    Admitted for diverticulitis in May, went to rehab, got C. Diff.  Released 4 days ago, worsening.  Started on oral Vanc last night.  Soft BPs, nonspecific colitis on CT.  Needs ongoing treatment.  COVID negative.  Consultants  . Infectious disease  Procedures  . None  Antibiotics  . Oral Vancomycin  Subjective  The patient states that her stools have slowed down. She is interested in a clear liquid diet.  Objective   Vitals:  Vitals:   08/28/18 0923 08/28/18 1257  BP: 117/60 112/66  Pulse: 88 88   Resp:    Temp: 98 F (36.7 C) (!) 97.5 F (36.4 C)  SpO2: 100% 100%    Exam:  Constitutional:  . The patient is awake, alert, and oriented x 3. No acute distress. Respiratory:  . No wheezes, rales, or rhonchi. . No tactile fremitus . No increased work of breathing. Cardiovascular:  . Regular rate and rhythm. . No murmurs, ectopy, or gallups. . No lateral PMI. No thrills. Abdomen:  . Abdomen is soft, non-tender, non-distended . No hernias, masses, or hernias are appreciated. . Hyperactive bowel sounds. Musculoskeletal:  . No cyanosis, clubbing, or edema Skin:  . No rashes, lesions, ulcers . palpation of skin: no induration or nodules Neurologic:  . CN 2-12 intact . Sensation all 4 extremities intact Psychiatric:  . Mental status o Mood, affect appropriate o Orientation to person, place, time  . judgment and insight appear intact    I have personally reviewed the following:   Today's Data  . BMP, CBC, I's and O's, and Vitals  Scheduled Meds: . apixaban  2.5 mg Oral BID  . atorvastatin  20 mg Oral QHS  . hydrocortisone sod succinate (SOLU-CORTEF) inj  50 mg Intravenous Q6H  . insulin aspart  0-5 Units Subcutaneous QHS  . insulin aspart  0-9 Units  Subcutaneous TID WC  . sodium chloride flush  3 mL Intravenous Q12H  . vancomycin  125 mg Oral QID   Followed by  . [START ON 09/11/2018] vancomycin  125 mg Oral BID   Followed by  . [START ON 09/18/2018] vancomycin  125 mg Oral Daily   Followed by  . [START ON 09/25/2018] vancomycin  125 mg Oral QODAY   Followed by  . [START ON 10/03/2018] vancomycin  125 mg Oral Q3 days   Continuous Infusions: . sodium chloride 100 mL/hr at 08/28/18 1024    Principal Problem:   C. difficile colitis Active Problems:   Rheumatoid arthritis ([place])   Mixed hyperlipidemia   AKI (acute kidney injury) ([place])   Diabetes mellitus type 2 in nonobese (HCC)   PAF (paroxysmal atrial fibrillation) (HCC)   Hypoalbuminemia due to  protein-calorie malnutrition (HCC)   Essential hypertension   Pressure injury of skin   LOS: 1 day    A & P  Recurrent C diff colitis: Patient was admitted from 5/21-6/3 for septic shock due to C diff. She was discharged to rehab and has done well until the last 2-3 days. Repeat abdominal cramping with refractory diarrhea and incontinence. Patient discussed with ID. Will treat with PO Vancomycin 500 mg BID for a 21 day taper. ID has been consulted. Fecal transplant may start back in July; can consider if patient continues to have recurrent/refractory issues. Diarrhea is waning. There is no need for GI consult at this time.  AKI on stage 3 CKD: Creatinine is improved at 1.66 this afternoon. Monitor. Continue IV fluids cautiously. As noted above, this is very likely related to prerenal azotemia in the setting of decreased PO intake and excessive GI losses.  Metabolic acidosis: Due to GI losses of bicarbonate. Supplement.   Hypocalcemia: Ionized calcium is pending. Serum albumin is pending.  Afib, on Eliquis: Rate is well controlled on Coreg, but this will be held for now given persistent hypotension while in the ER. It may be reasonable to resume Coreg tomorrow. Continue Eliquis.  HTN: Restart Coreg.  Hold Coreg for now based on hypotension in the ER. Consider resumption of Coreg tomorrow.  DM: A1c was 8.8 on 5/21. Will hold NPH for now while NPO. Will cover with sensitive-scale SSI.  HLD: Continue Lipitor  Malnutrition: She may benefit from nutrition consultation once she is eating.  RA: Enbrel has been held since before last hospitalization, roughly 5/15. Prednisone was recently increased from 5 to 10 mg daily. For now, will use stress-dosed steroids with hydrocortisone 50 mg IV q6h, as patient is NPO.  Note: This patient has been tested and is negative for the novel coronavirus COVID-19.  I have seen and examined this patient myself. I have spent 45 minutes in her evaluation  and care. I have discussed the patient in detail with her daughter. All questions answered to the best of my ability.  DVT prophylaxis: Eliquis Code Status:  DNR - confirmed with patient/family Family Communication: Daughter was present throughout hospitalization  Disposition Plan:  Home once clinically improved  Tifany Hirsch, DO [facility] Direct contact: see [email or web address]  7PM-7AM contact night coverage as above  08/28/2018, 5:23 PM  LOS: 1 day

## 2018-08-28 NOTE — Progress Notes (Deleted)
[facility] called to say they will look over referral and put pt on wait list. Once bed is available they will place pt.

## 2018-08-28 NOTE — Progress Notes (Signed)
Noticed bright red streaks of blood when pt wiping bottom after using the bathroom. Assessed for bleeding around rectum and perineum area. No bleeding found. NP Bodenheimer made aware. Will continue to monitor.

## 2018-08-28 NOTE — Progress Notes (Signed)
Pt expresses having difficulty sleeping the past few nights. Pt requested melatonin to help her sleep tonight. NP Bodenheimer paged. Will continue to monitor and treat per orders.

## 2018-08-28 NOTE — Telephone Encounter (Signed)
She does not need a follow up.  Her home health orders will come from discharging physician.  Thanks.

## 2018-08-28 NOTE — Progress Notes (Signed)
Physical Therapy Evaluation Patient Details Name: Shelby Walker MRN: [id number] DOB: Sep 28, 1933 Today's Date: 08/28/2018   History of Present Illness  Patient is an 85 year old female who presented to the hopital with loose stools. She was diagnosed with C-diff. Today she reports it has improved. PMH: RA, psoriasis, palpations, osteoperosis, DMII  Clinical Impression  Patient was fatigued with gait but steady. She will likely be appropriate to go home with home health. She required supervision when standing for initial standing balance. She ambulated 20' with mild fatigue. Acute therapy will continue to work with the patient to improve her endurance and gait distance.     Follow Up Recommendations Home health PT    Equipment Recommendations  None recommended by PT    Recommendations for Other Services       Precautions / Restrictions Precautions Precautions: Fall Restrictions Weight Bearing Restrictions: No      Mobility  Bed Mobility Overal bed mobility: Needs Assistance Bed Mobility: Supine to Sit;Rolling Rolling: Supervision   Supine to sit: Supervision     General bed mobility comments: suopervsion to the edge of the bed and back into the bed   Transfers Overall transfer level: Needs assistance Equipment used: Rolling walker (2 wheeled)   Sit to Stand: Min guard         General transfer comment: mild syncope upon standing   Ambulation/Gait Ambulation/Gait assistance: Min guard Gait Distance (Feet): 20 Feet Assistive device: Rolling walker (2 wheeled) Gait Pattern/deviations: Step-to pattern Gait velocity: decreased   General Gait Details: slow but steady gait.   Stairs            Wheelchair Mobility    Modified Rankin (Stroke Patients Only)       Balance Overall balance assessment: Needs assistance Sitting-balance support: No upper extremity supported;Feet supported Sitting balance-Leahy Scale: Good     Standing balance support: Bilateral  upper extremity supported Standing balance-Leahy Scale: Poor Standing balance comment: good                             Pertinent Vitals/Pain Pain Assessment: Faces Faces Pain Scale: Hurts a little bit Pain Location: stomach Pain Descriptors / Indicators: Constant;Discomfort Pain Intervention(s): Limited activity within patient's tolerance;Monitored during session    [place] expects to be discharged to:: Private residence Living Arrangements: Spouse/significant other Available Help at Discharge: Family Type of Home: House Home Access: Stairs to enter Entrance Stairs-Rails: None Entrance Stairs-Number of Steps: 3 Home Layout: Able to live on main level with bedroom/bathroom;Laundry or work area in basement   Additional Comments: spouse has heart problems     Prior Function Level of Independence: Independent with assistive device(s)         Comments: using a walker at home      Hand Dominance   Dominant Hand: Right    Extremity/Trunk Assessment   Upper Extremity Assessment Upper Extremity Assessment: Overall WFL for tasks assessed    Lower Extremity Assessment Lower Extremity Assessment: Overall WFL for tasks assessed    Cervical / Trunk Assessment Cervical / Trunk Assessment: Normal  Communication   Communication: No difficulties  Cognition Arousal/Alertness: Awake/alert Behavior During Therapy: WFL for tasks assessed/performed Overall Cognitive Status: Within Functional Limits for tasks assessed                                 General Comments: very  pleasent      General Comments      Exercises     Assessment/Plan    PT Assessment Patient needs continued PT services  PT Problem List Decreased strength;Decreased mobility;Decreased activity tolerance;Decreased balance;Decreased knowledge of use of DME       PT Treatment Interventions      PT Goals (Current goals can be found in the Care Plan section)   Acute Rehab PT Goals Patient Stated Goal: to get stronger PT Goal Formulation: With patient Time For Goal Achievement: 08/05/18 Potential to Achieve Goals: Fair    Frequency Min 3X/week   Barriers to discharge        Co-evaluation               AM-PAC PT "6 Clicks" Mobility  Outcome Measure Help needed turning from your back to your side while in a flat bed without using bedrails?: A Little Help needed moving from lying on your back to sitting on the side of a flat bed without using bedrails?: A Little Help needed moving to and from a bed to a chair (including a wheelchair)?: A Little Help needed standing up from a chair using your arms (e.g., wheelchair or bedside chair)?: A Little Help needed to walk in hospital room?: A Little Help needed climbing 3-5 steps with a railing? : A Little 6 Click Score: 18    End of Session Equipment Utilized During Treatment: Gait belt Activity Tolerance: Patient tolerated treatment well Patient left: in bed;with bed alarm set Nurse Communication: Mobility status PT Visit Diagnosis: Other abnormalities of gait and mobility (R26.89)    Time: 4944-9675 PT Time Calculation (min) (ACUTE ONLY): 25 min   Charges:   PT Evaluation $PT Eval Moderate Complexity: 1 Mod            Carney Living PT DPT  08/28/2018, 3:23 PM

## 2018-08-28 NOTE — Progress Notes (Signed)
CRITICAL VALUE ALERT  Critical Value:  Calcium 6.1  Date & Time Notied:  08/28/18 1700  Provider Notified: Karie Kirks DO  Orders Received/Actions taken: MD notified. Awaiting orders. Will continue to monitor.

## 2018-08-28 NOTE — Progress Notes (Signed)
CRITICAL VALUE ALERT  Critical Value:  Calcium 6.0   Date & Time Notied:  08/28/2018 9983  Provider Notified: Baltazar Najjar  Orders Received/Actions taken: Awaiting orders.

## 2018-08-29 DIAGNOSIS — E46 Unspecified protein-calorie malnutrition: Secondary | ICD-10-CM

## 2018-08-29 DIAGNOSIS — I10 Essential (primary) hypertension: Secondary | ICD-10-CM

## 2018-08-29 DIAGNOSIS — E872 Acidosis: Secondary | ICD-10-CM

## 2018-08-29 LAB — COMPREHENSIVE METABOLIC PANEL
ALT: 9 U/L (ref 0–44)
ALT: 9 U/L (ref 0–44)
ALT: 11 U/L (ref 0–44)
AST: 12 U/L — ABNORMAL LOW (ref 15–41)
AST: 9 U/L — ABNORMAL LOW (ref 15–41)
AST: 18 U/L (ref 15–41)
Albumin: 1.9 g/dL — ABNORMAL LOW (ref 3.5–5.0)
Albumin: 1.7 g/dL — ABNORMAL LOW (ref 3.5–5.0)
Albumin: 2 g/dL — ABNORMAL LOW (ref 3.5–5.0)
Alkaline Phosphatase: 76 U/L (ref 38–126)
Alkaline Phosphatase: 63 U/L (ref 38–126)
Alkaline Phosphatase: 65 U/L (ref 38–126)
Anion gap: 12 (ref 5–15)
Anion gap: 12 (ref 5–15)
Anion gap: 10 (ref 5–15)
BUN: 30 mg/dL — ABNORMAL HIGH (ref 8–23)
BUN: 27 mg/dL — ABNORMAL HIGH (ref 8–23)
BUN: 26 mg/dL — ABNORMAL HIGH (ref 8–23)
CO2: 15 mmol/L — ABNORMAL LOW (ref 22–32)
CO2: 15 mmol/L — ABNORMAL LOW (ref 22–32)
CO2: 17 mmol/L — ABNORMAL LOW (ref 22–32)
Calcium: 6.3 mg/dL — CL (ref 8.9–10.3)
Calcium: 6 mg/dL — CL (ref 8.9–10.3)
Calcium: 6.8 mg/dL — ABNORMAL LOW (ref 8.9–10.3)
Chloride: 116 mmol/L — ABNORMAL HIGH (ref 98–111)
Chloride: 117 mmol/L — ABNORMAL HIGH (ref 98–111)
Chloride: 118 mmol/L — ABNORMAL HIGH (ref 98–111)
Creatinine, Ser: 1.36 mg/dL — ABNORMAL HIGH (ref 0.44–1.00)
Creatinine, Ser: 1.34 mg/dL — ABNORMAL HIGH (ref 0.44–1.00)
Creatinine, Ser: 1.33 mg/dL — ABNORMAL HIGH (ref 0.44–1.00)
GFR calc Af Amer: 42 mL/min — ABNORMAL LOW (ref >60)
GFR calc Af Amer: 42 mL/min — ABNORMAL LOW (ref >60)
GFR calc Af Amer: 43 mL/min — ABNORMAL LOW (ref >60)
GFR calc non Af Amer: 36 mL/min — ABNORMAL LOW (ref >60)
GFR calc non Af Amer: 37 mL/min — ABNORMAL LOW (ref >60)
GFR calc non Af Amer: 37 mL/min — ABNORMAL LOW (ref >60)
Glucose, Bld: 281 mg/dL — ABNORMAL HIGH (ref 70–99)
Glucose, Bld: 268 mg/dL — ABNORMAL HIGH (ref 70–99)
Glucose, Bld: 163 mg/dL — ABNORMAL HIGH (ref 70–99)
Potassium: 3.7 mmol/L (ref 3.5–5.1)
Potassium: 3.9 mmol/L (ref 3.5–5.1)
Potassium: 3.7 mmol/L (ref 3.5–5.1)
Sodium: 143 mmol/L (ref 135–145)
Sodium: 144 mmol/L (ref 135–145)
Sodium: 145 mmol/L (ref 135–145)
Total Bilirubin: 1 mg/dL (ref 0.3–1.2)
Total Bilirubin: 0.7 mg/dL (ref 0.3–1.2)
Total Bilirubin: 0.2 mg/dL — ABNORMAL LOW (ref 0.3–1.2)
Total Protein: 4.9 g/dL — ABNORMAL LOW (ref 6.5–8.1)
Total Protein: 4.3 g/dL — ABNORMAL LOW (ref 6.5–8.1)
Total Protein: 4.6 g/dL — ABNORMAL LOW (ref 6.5–8.1)

## 2018-08-29 LAB — GLUCOSE, CAPILLARY
Glucose-Capillary: 228 mg/dL — ABNORMAL HIGH (ref 70–99)
Glucose-Capillary: 233 mg/dL — ABNORMAL HIGH (ref 70–99)
Glucose-Capillary: 163 mg/dL — ABNORMAL HIGH (ref 70–99)
Glucose-Capillary: 242 mg/dL — ABNORMAL HIGH (ref 70–99)

## 2018-08-29 LAB — CALCIUM, IONIZED: Calcium, Ionized, Serum: 3.8 mg/dL — ABNORMAL LOW (ref 4.5–5.6)

## 2018-08-29 MED ORDER — VANCOMYCIN HCL 125 MG PO CAPS: ORAL_CAPSULE | ORAL | 0 refills | Status: DC

## 2018-08-29 MED ORDER — VANCOMYCIN HCL 125 MG PO CAPS: 125.0000 mg | ORAL_CAPSULE | ORAL | 0 refills | Status: DC

## 2018-08-29 MED ORDER — CALCIUM GLUCONATE-NACL 2-0.675 GM/100ML-% IV SOLN
2.0000 g | Freq: Once | INTRAVENOUS | Status: AC
  Administered 2018-08-29: 2000 mg via INTRAVENOUS
  Filled 2018-08-29: qty 100

## 2018-08-29 MED ORDER — CARVEDILOL 3.125 MG PO TABS
3.1250 mg | ORAL_TABLET | Freq: Two times a day (BID) | ORAL | Status: DC
  Administered 2018-08-29 – 2018-09-03 (×10): 3.125 mg via ORAL
  Filled 2018-08-29 (×10): qty 1

## 2018-08-29 MED ORDER — INSULIN NPH (HUMAN) (ISOPHANE) 100 UNIT/ML ~~LOC~~ SUSP
5.0000 [IU] | Freq: Two times a day (BID) | SUBCUTANEOUS | Status: DC
  Administered 2018-08-29 – 2018-09-02 (×10): 5 [IU] via SUBCUTANEOUS
  Filled 2018-08-29: qty 10

## 2018-08-29 NOTE — Telephone Encounter (Signed)
Notified. 

## 2018-08-29 NOTE — Progress Notes (Signed)
Inpatient Diabetes Program Recommendations  AACE/ADA: New Consensus Statement on Inpatient Glycemic Control (2015)  Target Ranges:  Prepandial:   less than 140 mg/dL      Peak postprandial:   less than 180 mg/dL (1-2 hours)      Critically ill patients:  140 - 180 mg/dL   Lab Results  Component Value Date   GLUCAP 233 (H) 08/29/2018   HGBA1C 8.8 (H) 07/24/2018    Review of Glycemic Control Results for Shelby Walker, Shelby Walker (MRN [id number]) as of 08/29/2018 13:25  Ref. Range 08/28/2018 20:51 08/29/2018 07:56 08/29/2018 13:05  Glucose-Capillary Latest Ref Range: 70 - 99 mg/dL 187 (H) 228 (H) 233 (H)   Diabetes history: Type 2 DM Outpatient Diabetes medications: NPH 5 units BID Current orders for Inpatient glycemic control: NPH 5 units BID, Novolog 0-9 units TID, Novolog 0-5 units QHS Solucortef 50 mg Q6H Inpatient Diabetes Program Recommendations:    In the setting of steroids, consider increasing Novolog 0-15 units TID and changing diet to carb modified.   Thanks, Bronson Curb, MSN, RNC-OB Diabetes Coordinator [phone] (8a-5p)

## 2018-08-29 NOTE — Progress Notes (Signed)
Initial Nutrition Assessment  DOCUMENTATION CODES:   Not applicable  INTERVENTION:  Initiate calorie count when diet advanced to FL  Boost Breeze po TID, each supplement provides 250 kcal and 9 grams of protein  Prostat po BID, each supplement provides 100 kcal and 15 grams of protein   NUTRITION DIAGNOSIS:   Inadequate oral intake related to diarrhea, acute illness(C-diff) as evidenced by other (comment)(CL diet insufficent to meet estimated needs).   GOAL:   Patient will meet greater than or equal to 90% of their needs    MONITOR:   Labs, PO intake, Weight trends, Supplement acceptance, Diet advancement  REASON FOR ASSESSMENT:   Consult Calorie Count, Poor PO  ASSESSMENT:  85 year old female with medical history significant of RA, HLD, T2DM, CAD, colitis, diverticulitis. Patient admitted for diverticulitis in May, went to rehab where she contracted C.Diff and d/c on 6/20  from [facility]. Patient returned to hospital on 6/24 with abdominal pain, recurrent diarrhea and vomiting   RD consulted for calorie count. Calorie count is not appropriate to initiate at this time d/t current CL diet order insufficient to meet estimated needs. Will continue to monitor for diet advancement and initiate calorie count at that time.   Weights reviewed - 9lb wt loss noted since 5/06  6.8% change in wt x 1 month (significant for time frame) Suspect mild/moderate malnutrition considering history of wt loss, recurrent C-diff, and hospital LOS prior to current admission.  NUTRITION - FOCUSED PHYSICAL EXAM:  Unable to access; pt under enteric precautions  Diet Order:   Diet Order            Diet regular Room service appropriate? Yes; Fluid consistency: Thin  Diet effective now              EDUCATION NEEDS:   Not appropriate for education at this time  Skin:  Skin Assessment: Skin Integrity Issues: Skin Integrity Issues:: Stage I Stage I: sacrum  Last BM:  6/25 Type 6  (brown;small)  Height:   Ht Readings from Last 1 Encounters:  08/07/18 5\' 1"  (1.549 m)    Weight:   Wt Readings from Last 1 Encounters:  08/23/18 56.1 kg    Ideal Body Weight:  47.7 kg  BMI:  There is no height or weight on file to calculate BMI.  Estimated Nutritional Needs:   Kcal:  1200-1350  Protein:  67-79g  Fluid:  >/=1.3L    Lajuan Lines, RD, LDN  After Hours/Weekend Pager: [phone]

## 2018-08-29 NOTE — Progress Notes (Signed)
Occupational Therapy Evaluation Patient Details Name: Shelby Walker MRN: [id number] DOB: 07/04/1933 Today's Date: 08/29/2018    History of Present Illness Patient is an 85 year old female who presented to the hopital with loose stools. She was diagnosed with C-diff. Today she reports it has improved. PMH: RA, psoriasis, palpations, osteoperosis, DMII   Clinical Impression   Pt admitted with above diagnosis.  She recently discharged home from CIR within past few days.  Since discharge home, her daughters have been assisting pt and pt's husband with meals and running errands.  Pt requiring supervision-min guard with ADLs today.  She reports she feels that she has gotten weaker in the past few days and is eager to re-gain her strength and return home.  Therapist provided level 1 theraband (yellow) and completed UE exercises with her. Recommend discharge home with family support. Will continue to follow acutely in order to maximize safety and independence with ADLs at home.     Follow Up Recommendations  Home health OT;Supervision/Assistance - 24 hour    Equipment Recommendations  None recommended by OT    Recommendations for Other Services       Precautions / Restrictions Precautions Precautions: Fall      Mobility Bed Mobility Overal bed mobility: Needs Assistance Bed Mobility: Supine to Sit     Supine to sit: Supervision     General bed mobility comments: supervision for safety  Transfers Overall transfer level: Needs assistance Equipment used: Rolling walker (2 wheeled) Transfers: Sit to/from Stand Sit to Stand: Supervision              Balance Overall balance assessment: Needs assistance Sitting-balance support: No upper extremity supported;Feet supported Sitting balance-Leahy Scale: Good     Standing balance support: Bilateral upper extremity supported Standing balance-Leahy Scale: Fair                             ADL either performed or assessed  with clinical judgement   ADL Overall ADL's : Needs assistance/impaired Eating/Feeding: Sitting;Modified independent   Grooming: Min guard;Standing;Wash/dry hands;Brushing hair   Upper Body Bathing: Sitting;Set up   Lower Body Bathing: Sit to/from stand;Set up   Upper Body Dressing : Set up;Sitting   Lower Body Dressing: Set up;Sit to/from stand Lower Body Dressing Details (indicate cue type and reason): dons/doffs socks with figure four pattern Toilet Transfer: Min guard;Ambulation;RW   Toileting- Water quality scientist and Hygiene: Min guard;Sit to/from stand       Functional mobility during ADLs: Min guard;Rolling walker       Vision Baseline Vision/History: Wears glasses Wears Glasses: Reading only Patient Visual Report: No change from baseline       Perception     Praxis      Pertinent Vitals/Pain Pain Assessment: No/denies pain     Hand Dominance Right   Extremity/Trunk Assessment Upper Extremity Assessment Upper Extremity Assessment: Generalized weakness;LUE deficits/detail(arthritis in bilateral hands) LUE Deficits / Details: left weaker than right due to h/o multiple fractures   Lower Extremity Assessment Lower Extremity Assessment: Defer to PT evaluation       Communication Communication Communication: No difficulties   Cognition Arousal/Alertness: Awake/alert Behavior During Therapy: WFL for tasks assessed/performed Overall Cognitive Status: Within Functional Limits for tasks assessed  General Comments       Exercises Exercises: General Upper Extremity General Exercises - Upper Extremity Shoulder ABduction: Strengthening;Both;5 reps;Seated;Theraband Theraband Level (Shoulder Abduction): Level 1 (Yellow) Shoulder ADduction: Strengthening;Both;5 reps;Seated;Theraband Theraband Level (Shoulder Adduction): Level 1 (Yellow) Elbow Flexion: Strengthening;Both;Seated;Theraband;10 reps Theraband  Level (Elbow Flexion): Level 1 (Yellow)   Shoulder Instructions      Home Living Family/patient expects to be discharged to:: Private residence Living Arrangements: Spouse/significant other Available Help at Discharge: Family;Available 24 hours/day Type of Home: House Home Access: Stairs to enter [employer] of Steps: 3 Entrance Stairs-Rails: None Home Layout: Able to live on main level with bedroom/bathroom;Laundry or work area in [employer] Shower/Tub: Tub/shower unit;Walk-in shower   Bathroom Toilet: Standard Bathroom Accessibility: Yes   Home Equipment: Walker - 2 wheels;Cane - single point;Bedside commode;Tub bench   Additional Comments: spouse has heart problems   Lives With: Spouse    Prior Functioning/Environment Level of Independence: Independent with assistive device(s)        Comments: using a walker at home. daughters assisting with meals since pt discharged home with CIR.        OT Problem List: Decreased strength;Decreased activity tolerance;Decreased knowledge of use of DME or AE;Impaired balance (sitting and/or standing);Decreased coordination      OT Treatment/Interventions: Self-care/ADL training;Energy conservation;Balance training;Therapeutic exercise;DME and/or AE instruction;Therapeutic activities;Patient/family education    OT Goals(Current goals can be found in the care plan section) Acute Rehab OT Goals Patient Stated Goal: to get stronger OT Goal Formulation: With patient Time For Goal Achievement: 09/12/18 Potential to Achieve Goals: Good  OT Frequency: Min 2X/week   Barriers to D/C:            Co-evaluation              AM-PAC OT "6 Clicks" Daily Activity     Outcome Measure Help from another person eating meals?: None Help from another person taking care of personal grooming?: A Little(in standing) Help from another person toileting, which includes using toliet, bedpan, or urinal?: A Little Help from  another person bathing (including washing, rinsing, drying)?: A Little Help from another person to put on and taking off regular upper body clothing?: A Little Help from another person to put on and taking off regular lower body clothing?: A Little 6 Click Score: 19   End of Session Equipment Utilized During Treatment: Gait belt;Rolling walker Nurse Communication: Mobility status  Activity Tolerance: Patient tolerated treatment well Patient left: in chair  OT Visit Diagnosis: Other abnormalities of gait and mobility (R26.89);Unsteadiness on feet (R26.81);Muscle weakness (generalized) (M62.81);History of falling (Z91.81)                Time: 1415-1501 OT Time Calculation (min): 46 min Charges:  OT General Charges $OT Visit: 1 Visit OT Evaluation $OT Eval Moderate Complexity: 1 Mod OT Treatments $Self Care/Home Management : 8-22 mins $Therapeutic Exercise: 8-22 mins    Darrol Jump OTR/L [place] [phone] 08/29/2018, 3:11 PM

## 2018-08-29 NOTE — Progress Notes (Signed)
CRITICAL VALUE ALERT  Critical Value:  Calcium 6.3  Date & Time Notied:  08/29/2018 0042  Provider Notified: Silas [place]  Orders Received/Actions taken: Awaiting orders.

## 2018-08-29 NOTE — Progress Notes (Signed)
[place] for Infectious Disease    Date of Admission:  08/27/2018   Total days of antibiotics 3 oral vanco           ID: Shelby Walker is a 85 y.o. female with  Recurrent clostridium difficile Principal Problem:   C. difficile colitis Active Problems:   Rheumatoid arthritis ([place])   Mixed hyperlipidemia   AKI (acute kidney injury) ([place])   Diabetes mellitus type 2 in nonobese (HCC)   PAF (paroxysmal atrial fibrillation) (HCC)   Hypoalbuminemia due to protein-calorie malnutrition (HCC)   Essential hypertension   Pressure injury of skin    Subjective: Afebrile. Only  one bm yesterday.  Loose. She denies abdominal cramping Medications:  . apixaban  2.5 mg Oral BID  . atorvastatin  20 mg Oral QHS  . hydrocortisone sod succinate (SOLU-CORTEF) inj  50 mg Intravenous Q6H  . insulin aspart  0-5 Units Subcutaneous QHS  . insulin aspart  0-9 Units Subcutaneous TID WC  . insulin NPH Human  5 Units Subcutaneous BID AC & HS  . sodium chloride flush  3 mL Intravenous Q12H  . vancomycin  125 mg Oral QID   Followed by  . [START ON 09/11/2018] vancomycin  125 mg Oral BID   Followed by  . [START ON 09/18/2018] vancomycin  125 mg Oral Daily   Followed by  . [START ON 09/25/2018] vancomycin  125 mg Oral QODAY   Followed by  . [START ON 10/03/2018] vancomycin  125 mg Oral Q3 days    Objective: Vital signs in last 24 hours: Temp:  [97.5 F (36.4 C)-98.6 F (37 C)] 98.3 F (36.8 C) (06/26 0754) Pulse Rate:  [81-97] 97 (06/26 0754) Resp:  [14-18] 14 (06/26 0608) BP: (102-134)/(52-120) 134/120 (06/26 0754) SpO2:  [99 %-100 %] 99 % (06/26 0754) Physical Exam  Constitutional:  oriented to person, place, and time. appears well-developed and well-nourished. No distress.  HENT: [place]/AT, PERRLA, no scleral icterus Mouth/Throat: Oropharynx is clear and moist. No oropharyngeal exudate.  Cardiovascular: Normal rate, regular rhythm and normal heart sounds. Exam reveals no gallop and no friction  rub.  No murmur heard.  Pulmonary/Chest: Effort normal and breath sounds normal. No respiratory distress.  has no wheezes.  Neck = supple, no nuchal rigidity Abdominal: Soft. Bowel sounds are decreased  exhibits no distension. There is no tenderness.  Lymphadenopathy: no cervical adenopathy. No axillary adenopathy Ext: ra deformity to hands bilaterally Skin: Skin is warm and dry. No rash noted. No erythema.  Psychiatric: a normal mood and affect.  behavior is normal.    Lab Results Recent Labs    08/27/18 0817 08/28/18 0345  08/28/18 2206 08/29/18 0636  WBC 15.1* 13.7*  --   --   --   HGB 9.1* 9.1*  --   --   --   HCT 29.3* 29.5*  --   --   --   NA 137 140   < > 143 144  K 3.8 4.5   < > 3.7 3.9  CL 109 117*   < > 116* 117*  CO2 16* 9*   < > 15* 15*  BUN 37* 29*   < > 30* 27*  CREATININE 1.95* 1.54*   < > 1.36* 1.34*   < > = values in this interval not displayed.   Liver Panel Recent Labs    08/28/18 2206 08/29/18 0636  PROT 4.9* 4.3*  ALBUMIN 1.9* 1.7*  AST 12* 9*  ALT 9  9  ALKPHOS 76 63  BILITOT 1.0 0.7    Microbiology: reviewed Studies/Results: No results found.   Assessment/Plan: Recurrent cdifficile, first recurrence = will plan to do oral vancomycin taper. Will follow back in the ID clinic. See consult note for vanco taper  I have advanced her diet to regular diet to see that she tolerates solid foods  Debility= would have pt/ot evaluate and work with patient  Will sign off.  [facility] for Infectious Diseases Cell: [phone] Pager: [phone]  08/29/2018, 11:58 AM

## 2018-08-29 NOTE — Progress Notes (Signed)
CRITICAL VALUE ALERT  Critical Value:Calcium 6.0  Date & Time Notied: 08/29/18 0822  Provider Notified: Benny Lennert  Orders Received/Actions taken: MD notified will continue to monitor.

## 2018-08-29 NOTE — Progress Notes (Signed)
PROGRESS NOTE  [name] SHF:[id number] DOB: May 23, 1933 DOA: 08/27/2018 PCP: Reynold Bowen, MD  Brief History    Shelby Walker is a 85 y.o. female with medical history significant of RA; HLD; and DM presenting with diarrhea.  Symptoms started May 6 - went to ER and diagnosed with diverticulitis.  Sent home with 2 antibiotics.  Slight improvement and then worsened.  On May 22, went back to [facility] and had + C  Diff testing, so admitted at [facility].  She ended up in ICU for about a week and then spent another week on the floor.  She then went to CIR for 2 weeks.  She went home on 6/20.  She was doing very well.  Late Sunday, she developed cramping abdominal pain and started with recurrent diarrhea.  She vomited twice on Monday.  The diarrhea worsened in frequency and amounts, with incontinence.  Slight fever to 100 one day but otherwise no fever.  [facility] came in early in the week and daughter has been closely tracking her vitals.  Her BP has bene low but not in the 80s - has mostly been 90/45.  Last night it was 98/50.   Her normal is maybe 116.  She is light-headed/dizzy with standing.  Last BM was about 630 this AM, none in ER.  She was having them hourly this AM.  She was recommended to restart oral Vanc yesterday.  She tends to have stools with ambulation/transfers.  She is not on Enbrel right now and so rheum yesterday increased prednisone from 5 to 10 mg daily.  Last dose of Enbrel was about 5/15.    Admitted for diverticulitis in May, went to rehab, got C. Diff.  Released 4 days ago, worsening.  Started on oral Vanc last night.  Soft BPs, nonspecific colitis on CT.  Needs ongoing treatment.  COVID negative.  Consultants  . Infectious disease  Procedures  . None  Antibiotics  . Oral Vancomycin  Subjective  The patient states that she has not had a stool since yesterday. Her diet has been advanced by ID.  Objective   Vitals:  Vitals:   08/29/18 0754 08/29/18 1307  BP: (!) 134/120 (!) 116/57   Pulse: 97 84  Resp:  16  Temp: 98.3 F (36.8 C) 98.5 F (36.9 C)  SpO2: 99% 100%    Exam:  Constitutional:  . The patient is awake, alert, and oriented x 3. No acute distress. Respiratory:  . No wheezes, rales, or rhonchi. . No tactile fremitus . No increased work of breathing. Cardiovascular:  . Regular rate and rhythm. . No murmurs, ectopy, or gallups. . No lateral PMI. No thrills. Abdomen:  . Abdomen is soft, non-tender, non-distended . No hernias, masses, or hernias are appreciated. . Hyperactive bowel sounds. Musculoskeletal:  . No cyanosis, clubbing, or edema Skin:  . No rashes, lesions, ulcers . palpation of skin: no induration or nodules Neurologic:  . CN 2-12 intact . Sensation all 4 extremities intact Psychiatric:  . Mental status o Mood, affect appropriate o Orientation to person, place, time  . judgment and insight appear intact    I have personally reviewed the following:   Today's Data  . BMP, CBC, I's and O's, and Vitals  Scheduled Meds: . apixaban  2.5 mg Oral BID  . atorvastatin  20 mg Oral QHS  . hydrocortisone sod succinate (SOLU-CORTEF) inj  50 mg Intravenous Q6H  . insulin aspart  0-5 Units Subcutaneous QHS  . insulin aspart  0-9 Units Subcutaneous TID WC  . insulin NPH Human  5 Units Subcutaneous BID AC & HS  . sodium chloride flush  3 mL Intravenous Q12H  . vancomycin  125 mg Oral QID   Followed by  . [START ON 09/11/2018] vancomycin  125 mg Oral BID   Followed by  . [START ON 09/18/2018] vancomycin  125 mg Oral Daily   Followed by  . [START ON 09/25/2018] vancomycin  125 mg Oral QODAY   Followed by  . [START ON 10/03/2018] vancomycin  125 mg Oral Q3 days   Continuous Infusions: . sodium chloride 75 mL/hr at 08/29/18 1134    Principal Problem:   C. difficile colitis Active Problems:   Rheumatoid arthritis ([place])   Mixed hyperlipidemia   AKI (acute kidney injury) ([place])   Diabetes mellitus type 2 in nonobese (HCC)   PAF  (paroxysmal atrial fibrillation) (HCC)   Hypoalbuminemia due to protein-calorie malnutrition (HCC)   Essential hypertension   Pressure injury of skin   LOS: 2 days    A & P  Recurrent C diff colitis: Patient was admitted from 5/21-6/3 for septic shock due to C diff. She was discharged to rehab and has done well until the last 2-3 days. Repeat abdominal cramping with refractory diarrhea and incontinence. Patient discussed with ID. Will treat with PO Vancomycin 500 mg BID for a 21 day taper. Although there is no need at this time, fecal transplant may start back in July; can consider if patient continues to have recurrent/refractory issues. Diarrhea has stopped. There is no need for GI consult at this time. PT/OT to determine discharge needs.  AKI on stage 3 CKD: Creatinine is improved at 1.66 this afternoon. Monitor. Continue IV fluids cautiously. As noted above, this is very likely related to prerenal azotemia in the setting of decreased PO intake and excessive GI losses.  Metabolic acidosis: Due to GI losses of bicarbonate. Supplement. Anion Gap is closing.  Hypocalcemia: Corrected Calcium is low at 7.4. Have supplemented with 2 amps of calcium gluconate. Will recheck.  Afib, on Eliquis: Rate is well controlled on Coreg, but this will be held for now given persistent hypotension while in the ER. It may be reasonable to resume Coreg tomorrow. Continue Eliquis.  HTN: Restart Coreg.  Hold Coreg for now based on hypotension in the ER. Consider resumption of Coreg tomorrow.  DM: A1c was 8.8 on 5/21. Will hold NPH for now while NPO. Will cover with sensitive-scale SSI.  HLD: Continue Lipitor  Malnutrition: She may benefit from nutrition consultation once she is eating.  RA: Enbrel has been held since before last hospitalization, roughly 5/15. Prednisone was recently increased from 5 to 10 mg daily. For now, will use stress-dosed steroids with hydrocortisone 50 mg IV q6h, as patient is  NPO.  Note: This patient has been tested and is negative for the novel coronavirus COVID-19.  I have seen and examined this patient myself. I have spent 45 minutes in her evaluation and care. I have discussed the patient in detail with her daughter. All questions answered to the best of my ability.  DVT prophylaxis: Eliquis Code Status:  DNR - confirmed with patient/family Family Communication: Daughter was present throughout hospitalization  Disposition Plan:  Home once clinically improved  Dinara Lupu, DO [facility] Direct contact: see [email or web address]  7PM-7AM contact night coverage as above  08/29/2018, 3:17 PM  LOS: 1 day

## 2018-08-29 NOTE — Consult Note (Signed)
    [facility] CM Inpatient Consult   08/29/2018  [name] 1933-05-20 [id number]   Patient screened for extreme high risk score for unplanned readmission score of 30% noted with a less than 30 day readmission and multiple hospitalizations in the Medicare NGACO with [facility] [eligible for programs].      Review of patient's medical record reveals patient is from MD notes Hstory and Physical on 08/27/2018  as follows:  [name] is a 85 y.o. female with medical history significant of RA; HLD; and DM presenting with diarrhea.  Symptoms started May 6 - went to ER and diagnosed with diverticulitis.  Sent home with 2 antibiotics.  Slight improvement and then worsened.  On May 22, went back to [facility] and had + C  Diff testing, so admitted at [facility].  She ended up in ICU for about a week and then spent another week on the floor.    She then went to CIR for 2 weeks.  She went home on 6/20.  She was doing very well.  Late Sunday, she developed cramping abdominal pain and started with recurrent diarrhea.  She vomited twice on Monday.  The diarrhea worsened in frequency and amounts, with incontinence.  Slight fever to 100 one day but otherwise no fever.  [facility] came in early in the week and daughter has been closely tracking her vitals.  Her BP has bene low but not in the 80s - has mostly been 90/45.  Last night it was 98/50.   Her normal is maybe 116.  She is light-headed/dizzy with standing.  Last BM was about 630 this AM, none in ER.  She was having them hourly this AM.  She was recommended to restart oral Vanc yesterday.  Primary Care Provider: Dr. Reynold Bowen, MD.    Plan:  Follow for progress and needs with inpatient [facility] team.  Please contact [place] [facility] [facility] for needs:  Natividad Brood, RN BSN [facility] Liaison  [phone] business mobile phone Toll free office [phone]  Fax number: [phone] [email or web address]

## 2018-08-30 LAB — GLUCOSE, CAPILLARY
Glucose-Capillary: 126 mg/dL — ABNORMAL HIGH (ref 70–99)
Glucose-Capillary: 181 mg/dL — ABNORMAL HIGH (ref 70–99)
Glucose-Capillary: 104 mg/dL — ABNORMAL HIGH (ref 70–99)
Glucose-Capillary: 90 mg/dL (ref 70–99)

## 2018-08-30 LAB — COMPREHENSIVE METABOLIC PANEL
ALT: 11 U/L (ref 0–44)
AST: 12 U/L — ABNORMAL LOW (ref 15–41)
Albumin: 1.9 g/dL — ABNORMAL LOW (ref 3.5–5.0)
Alkaline Phosphatase: 54 U/L (ref 38–126)
Anion gap: 10 (ref 5–15)
BUN: 25 mg/dL — ABNORMAL HIGH (ref 8–23)
CO2: 15 mmol/L — ABNORMAL LOW (ref 22–32)
Calcium: 7 mg/dL — ABNORMAL LOW (ref 8.9–10.3)
Chloride: 120 mmol/L — ABNORMAL HIGH (ref 98–111)
Creatinine, Ser: 0.99 mg/dL (ref 0.44–1.00)
GFR calc Af Amer: >60 mL/min (ref >60)
GFR calc non Af Amer: 53 mL/min — ABNORMAL LOW (ref >60)
Glucose, Bld: 174 mg/dL — ABNORMAL HIGH (ref 70–99)
Potassium: 3.5 mmol/L (ref 3.5–5.1)
Sodium: 145 mmol/L (ref 135–145)
Total Bilirubin: 0.5 mg/dL (ref 0.3–1.2)
Total Protein: 4.4 g/dL — ABNORMAL LOW (ref 6.5–8.1)

## 2018-08-30 MED ORDER — SODIUM CHLORIDE 0.9% FLUSH: 10.0000 mL | INTRAVENOUS | Status: DC | PRN

## 2018-08-30 MED ORDER — CALCIUM GLUCONATE-NACL 2-0.675 GM/100ML-% IV SOLN
2.0000 g | Freq: Once | INTRAVENOUS | Status: AC
  Administered 2018-08-30: 2000 mg via INTRAVENOUS
  Filled 2018-08-30: qty 100

## 2018-08-30 MED ORDER — SODIUM BICARBONATE 8.4 % IV SOLN
100.0000 meq | Freq: Once | INTRAVENOUS | Status: AC
  Administered 2018-08-30: 100 meq via INTRAVENOUS
  Filled 2018-08-30: qty 50

## 2018-08-30 NOTE — Progress Notes (Signed)
PROGRESS NOTE  Shelby Walker QMV:[id number] DOB: Jun 29, 1933 DOA: 08/27/2018 PCP: Reynold Bowen, MD  Brief History    Shelby Walker is a 85 y.o. female with medical history significant of RA; HLD; and DM presenting with diarrhea.  Symptoms started May 6 - went to ER and diagnosed with diverticulitis.  Sent home with 2 antibiotics.  Slight improvement and then worsened.  On May 22, went back to [facility] and had + C  Diff testing, so admitted at [facility].  She ended up in ICU for about a week and then spent another week on the floor.  She then went to CIR for 2 weeks.  She went home on 6/20.  She was doing very well.  Late Sunday, she developed cramping abdominal pain and started with recurrent diarrhea.  She vomited twice on Monday.  The diarrhea worsened in frequency and amounts, with incontinence.  Slight fever to 100 one day but otherwise no fever.  [facility] came in early in the week and daughter has been closely tracking her vitals.  Her BP has bene low but not in the 80s - has mostly been 90/45.  Last night it was 98/50.   Her normal is maybe 116.  She is light-headed/dizzy with standing.  Last BM was about 630 this AM, none in ER.  She was having them hourly this AM.  She was recommended to restart oral Vanc yesterday.  She tends to have stools with ambulation/transfers.  She is not on Enbrel right now and so rheum yesterday increased prednisone from 5 to 10 mg daily.  Last dose of Enbrel was about 5/15.    Admitted for diverticulitis in May, went to rehab, got C. Diff.  Released 4 days ago, worsening.  Started on oral Vanc last night.  Soft BPs, nonspecific colitis on CT.  Needs ongoing treatment.  COVID negative.  Consultants  . Infectious disease  Procedures  . None  Antibiotics  . Oral Vancomycin  Subjective  The patient states that she is concerned because she had a BM this morning for the first time in 3 days and it was loose. Until last night she was on a liquid.  Objective   Vitals:  Vitals:    08/30/18 1158 08/30/18 1600  BP: (!) 119/57 119/62  Pulse: 77 74  Resp: 17 16  Temp: 98.4 F (36.9 C) 98.5 F (36.9 C)  SpO2: 97% 100%    Exam:  Constitutional:  . The patient is awake, alert, and oriented x 3. No acute distress. Respiratory:  . No wheezes, rales, or rhonchi. . No tactile fremitus . No increased work of breathing. Cardiovascular:  . Regular rate and rhythm. . No murmurs, ectopy, or gallups. . No lateral PMI. No thrills. Abdomen:  . Abdomen is soft, non-tender, non-distended . No hernias, masses, or hernias are appreciated. . Hyperactive bowel sounds. Musculoskeletal:  . No cyanosis, clubbing, or edema Skin:  . No rashes, lesions, ulcers . palpation of skin: no induration or nodules Neurologic:  . CN 2-12 intact . Sensation all 4 extremities intact Psychiatric:  . Mental status o Mood, affect appropriate o Orientation to person, place, time  . judgment and insight appear intact    I have personally reviewed the following:   Today's Data  . BMP, CBC, I's and O's, and Vitals  Scheduled Meds: . apixaban  2.5 mg Oral BID  . atorvastatin  20 mg Oral QHS  . carvedilol  3.125 mg Oral BID WC  . hydrocortisone  sod succinate (SOLU-CORTEF) inj  50 mg Intravenous Q6H  . insulin aspart  0-5 Units Subcutaneous QHS  . insulin aspart  0-9 Units Subcutaneous TID WC  . insulin NPH Human  5 Units Subcutaneous BID AC & HS  . sodium chloride flush  3 mL Intravenous Q12H  . vancomycin  125 mg Oral QID   Followed by  . [START ON 09/11/2018] vancomycin  125 mg Oral BID   Followed by  . [START ON 09/18/2018] vancomycin  125 mg Oral Daily   Followed by  . [START ON 09/25/2018] vancomycin  125 mg Oral QODAY   Followed by  . [START ON 10/03/2018] vancomycin  125 mg Oral Q3 days   Continuous Infusions: . sodium chloride 75 mL/hr at 08/29/18 1134    Principal Problem:   C. difficile colitis Active Problems:   Rheumatoid arthritis ([place])   Mixed hyperlipidemia    AKI (acute kidney injury) ([place])   Diabetes mellitus type 2 in nonobese (HCC)   PAF (paroxysmal atrial fibrillation) (HCC)   Hypoalbuminemia due to protein-calorie malnutrition (HCC)   Essential hypertension   Pressure injury of skin   LOS: 3 days    A & P  Recurrent C diff colitis: Patient was admitted from 5/21-6/3 for septic shock due to C diff. She was discharged to rehab and has done well until the last 2-3 days. Repeat abdominal cramping with refractory diarrhea and incontinence. Patient discussed with ID. Will treat with PO Vancomycin 500 mg BID for a 21 day taper. Although there is no need at this time, fecal transplant may start back in July; can consider if patient continues to have recurrent/refractory issues. Diarrhea has stopped. There is no need for GI consult at this time. PT/OT to determine discharge needs.   AKI on stage 3 CKD: Creatinine is improved at 0.99 this afternoon. Monitor. Continue IV fluids cautiously. As noted above, this is very likely related to prerenal azotemia in the setting of decreased PO intake and excessive GI losses.  Metabolic acidosis: Due to GI losses of bicarbonate. Supplement. Anion Gap is closing.  Hypocalcemia: Corrected Calcium is slowly improving. Have supplemented with 2 amps of calcium gluconate again this morning. Will recheck.  Afib, on Eliquis: Rate is well controlled on Coreg, but this will be held for now given persistent hypotension while in the ER. It may be reasonable to resume Coreg tomorrow. Continue Eliquis.  HTN: Restart Coreg. Blood pressures are well controlled. DM: A1c was 8.8 on 5/21. Will hold NPH for now while NPO. Will cover with sensitive-scale SSI.  HLD: Continue Lipitor  Malnutrition: She may benefit from nutrition consultation once she is eating.  RA: Enbrel has been held since before last hospitalization, roughly 5/15. Prednisone was recently increased from 5 to 10 mg daily. For now, will use stress-dosed  steroids with hydrocortisone 50 mg IV q6h, as patient is NPO.  Note: This patient has been tested and is negative for the novel coronavirus COVID-19.  I have seen and examined this patient myself. I have spent 34 minutes in her evaluation and care.   DVT prophylaxis: Eliquis Code Status:  DNR - confirmed with patient/family Family Communication: Daughter was present throughout hospitalization  Disposition Plan:  Home once clinically improved  Abdulla Pooley, DO [facility] Direct contact: see [email or web address]  7PM-7AM contact night coverage as above  08/30/2018, 4:12 PM  LOS: 1 day

## 2018-08-31 LAB — COMPREHENSIVE METABOLIC PANEL
ALT: 8 U/L (ref 0–44)
AST: 13 U/L — ABNORMAL LOW (ref 15–41)
Albumin: 1.9 g/dL — ABNORMAL LOW (ref 3.5–5.0)
Alkaline Phosphatase: 49 U/L (ref 38–126)
Anion gap: 7 (ref 5–15)
BUN: 20 mg/dL (ref 8–23)
CO2: 19 mmol/L — ABNORMAL LOW (ref 22–32)
Calcium: 6.9 mg/dL — ABNORMAL LOW (ref 8.9–10.3)
Chloride: 118 mmol/L — ABNORMAL HIGH (ref 98–111)
Creatinine, Ser: 0.98 mg/dL (ref 0.44–1.00)
GFR calc Af Amer: >60 mL/min (ref >60)
GFR calc non Af Amer: 53 mL/min — ABNORMAL LOW (ref >60)
Glucose, Bld: 158 mg/dL — ABNORMAL HIGH (ref 70–99)
Potassium: 3.1 mmol/L — ABNORMAL LOW (ref 3.5–5.1)
Sodium: 144 mmol/L (ref 135–145)
Total Bilirubin: 0.4 mg/dL (ref 0.3–1.2)
Total Protein: 4.2 g/dL — ABNORMAL LOW (ref 6.5–8.1)

## 2018-08-31 LAB — GLUCOSE, CAPILLARY
Glucose-Capillary: 150 mg/dL — ABNORMAL HIGH (ref 70–99)
Glucose-Capillary: 130 mg/dL — ABNORMAL HIGH (ref 70–99)
Glucose-Capillary: 154 mg/dL — ABNORMAL HIGH (ref 70–99)
Glucose-Capillary: 215 mg/dL — ABNORMAL HIGH (ref 70–99)

## 2018-08-31 MED ORDER — POTASSIUM CHLORIDE 10 MEQ/100ML IV SOLN
10.0000 meq | INTRAVENOUS | Status: AC
  Administered 2018-08-31 (×2): 10 meq via INTRAVENOUS
  Filled 2018-08-31 (×2): qty 100

## 2018-08-31 MED ORDER — DICYCLOMINE HCL 10 MG PO CAPS
10.0000 mg | ORAL_CAPSULE | Freq: Three times a day (TID) | ORAL | Status: DC
  Administered 2018-08-31 – 2018-09-03 (×10): 10 mg via ORAL
  Filled 2018-08-31 (×10): qty 1

## 2018-08-31 MED ORDER — POTASSIUM CHLORIDE 10 MEQ/100ML IV SOLN
10.0000 meq | INTRAVENOUS | Status: DC
  Filled 2018-08-31: qty 100

## 2018-08-31 MED ORDER — POTASSIUM CHLORIDE 10 MEQ/100ML IV SOLN
10.0000 meq | INTRAVENOUS | Status: AC
  Administered 2018-08-31 (×2): 10 meq via INTRAVENOUS
  Filled 2018-08-31: qty 100

## 2018-08-31 MED ORDER — CHOLESTYRAMINE 4 G PO PACK
4.0000 g | PACK | Freq: Two times a day (BID) | ORAL | Status: DC
  Administered 2018-08-31 – 2018-09-01 (×4): 4 g via ORAL
  Filled 2018-08-31 (×7): qty 1

## 2018-08-31 MED ORDER — SODIUM CHLORIDE 0.9 % IV BOLUS
500.0000 mL | Freq: Once | INTRAVENOUS | Status: AC
  Administered 2018-08-31: 500 mL via INTRAVENOUS

## 2018-08-31 MED ORDER — CALCIUM GLUCONATE-NACL 2-0.675 GM/100ML-% IV SOLN
2.0000 g | Freq: Once | INTRAVENOUS | Status: AC
  Administered 2018-08-31: 2000 mg via INTRAVENOUS
  Filled 2018-08-31: qty 100

## 2018-08-31 MED ORDER — MEGESTROL ACETATE 40 MG PO TABS
40.0000 mg | ORAL_TABLET | Freq: Every day | ORAL | Status: DC
  Administered 2018-08-31 – 2018-09-03 (×4): 40 mg via ORAL
  Filled 2018-08-31 (×4): qty 1

## 2018-08-31 MED ORDER — CALCIUM GLUCONATE-NACL 2-0.675 GM/100ML-% IV SOLN
2.0000 g | Freq: Once | INTRAVENOUS | Status: DC
  Filled 2018-08-31: qty 100

## 2018-08-31 NOTE — Progress Notes (Signed)
RN spoke to pt's daughter [name]. RN gave her update about pt's baseline overnight. She verbalized understanding and thanked Therapist, sports for talking with her. Daughter asked RN to page MD and have MD call her when she had moment today to given further update. RN verified page would be sent. RN paged [place] phone number and request.

## 2018-08-31 NOTE — Progress Notes (Signed)
PROGRESS NOTE  Shelby Walker NTZ:[id number] DOB: 10-Jan-1934 DOA: 08/27/2018 PCP: Shelby Bowen, MD  Brief History    Shelby Walker is a 85 y.o. female with medical history significant of RA; HLD; and DM presenting with diarrhea.  Symptoms started May 6 - went to ER and diagnosed with diverticulitis.  Sent home with 2 antibiotics.  Slight improvement and then worsened.  On May 22, went back to [facility] and had + C  Diff testing, so admitted at [facility].  She ended up in ICU for about a week and then spent another week on the floor.  She then went to CIR for 2 weeks.  She went home on 6/20.  She was doing very well.  Late Sunday, she developed cramping abdominal pain and started with recurrent diarrhea.  She vomited twice on Monday.  The diarrhea worsened in frequency and amounts, with incontinence.  Slight fever to 100 one day but otherwise no fever.  [facility] came in early in the week and daughter has been closely tracking her vitals.  Her BP has bene low but not in the 80s - has mostly been 90/45.  Last night it was 98/50.   Her normal is maybe 116.  She is light-headed/dizzy with standing.  Last BM was about 630 this AM, none in ER.  She was having them hourly this AM.  She was recommended to restart oral Vanc yesterday.  She tends to have stools with ambulation/transfers.  She is not on Enbrel right now and so rheum yesterday increased prednisone from 5 to 10 mg daily.  Last dose of Enbrel was about 5/15.    Admitted for diverticulitis in May, went to rehab, got C. Diff.  Released 4 days ago, worsening.  Started on oral Vanc last night.  Soft BPs, nonspecific colitis on CT.  Needs ongoing treatment.  COVID negative.  Consultants  . Infectious disease  Procedures  . None  Antibiotics  . Oral Vancomycin  Subjective  The patient continues to be orthostatic. She has had 2 BM's this morning. They have a pudding like consistency. She continues to feel very weak.  Objective   Vitals:  Vitals:   08/31/18  0426 08/31/18 1156  BP: (!) 137/54 (!) 139/57  Pulse: 73 71  Resp: 18 16  Temp: 98.6 F (37 C) 97.8 F (36.6 C)  SpO2: 98% 100%    Exam:  Constitutional:  . The patient is awake, alert, and oriented x 3. No acute distress. Respiratory:  . No wheezes, rales, or rhonchi. . No tactile fremitus . No increased work of breathing. Cardiovascular:  . Regular rate and rhythm. . No murmurs, ectopy, or gallups. . No lateral PMI. No thrills. Abdomen:  . Abdomen is soft, non-tender, non-distended . No hernias, masses, or hernias are appreciated. . Hyperactive bowel sounds. Musculoskeletal:  . No cyanosis, clubbing, or edema Skin:  . No rashes, lesions, ulcers . palpation of skin: no induration or nodules Neurologic:  . CN 2-12 intact . Sensation all 4 extremities intact Psychiatric:  . Mental status o Mood, affect appropriate o Orientation to person, place, time  . judgment and insight appear intact    I have personally reviewed the following:   Today's Data  . BMP, CBC, I's and O's, and Vitals  Scheduled Meds: . apixaban  2.5 mg Oral BID  . atorvastatin  20 mg Oral QHS  . carvedilol  3.125 mg Oral BID WC  . cholestyramine  4 g Oral BID  .  hydrocortisone sod succinate (SOLU-CORTEF) inj  50 mg Intravenous Q6H  . insulin aspart  0-5 Units Subcutaneous QHS  . insulin aspart  0-9 Units Subcutaneous TID WC  . insulin NPH Human  5 Units Subcutaneous BID AC & HS  . sodium chloride flush  3 mL Intravenous Q12H  . vancomycin  125 mg Oral QID   Followed by  . [START ON 09/11/2018] vancomycin  125 mg Oral BID   Followed by  . [START ON 09/18/2018] vancomycin  125 mg Oral Daily   Followed by  . [START ON 09/25/2018] vancomycin  125 mg Oral QODAY   Followed by  . [START ON 10/03/2018] vancomycin  125 mg Oral Q3 days   Continuous Infusions: . sodium chloride 75 mL/hr at 08/31/18 1143  . calcium gluconate    . potassium chloride 10 mEq (08/31/18 1514)  . potassium chloride       Principal Problem:   C. difficile colitis Active Problems:   Rheumatoid arthritis (Shelby Walker)   Mixed hyperlipidemia   AKI (acute kidney injury) (Shelby Walker)   Diabetes mellitus type 2 in nonobese (HCC)   PAF (paroxysmal atrial fibrillation) (HCC)   Hypoalbuminemia due to protein-calorie malnutrition (HCC)   Essential hypertension   Pressure injury of skin   LOS: 4 days    A & P  Recurrent C diff colitis: Patient was admitted from 5/21-6/3 for septic shock due to C diff. She was discharged to rehab and has done well until the last 2-3 days. Repeat abdominal cramping with refractory diarrhea and incontinence. Patient discussed with ID. Will treat with PO Vancomycin 500 mg BID for a 21 day taper. Although there is no need at this time, fecal transplant may start back in July; can consider if patient continues to have recurrent/refractory issues. Diarrhea has stopped. The patient had 3 BM's yesterday and two today. The consistency is no longer watery, but is now pudding like. There is no need for GI consult at this time. PT/OT to determine discharge needs, but the patient's ability to participate with PT/OT is limited due to the patient's continuing orthostasis.   AKI on stage 3 CKD: Creatinine is improved at 0.98 this morning. Monitor. Continue IV fluids cautiously. As noted above, this is very likely related to prerenal azotemia in the setting of decreased PO intake and excessive GI losses.  Metabolic acidosis: Due to GI losses of bicarbonate. Anion Gap is closing. Monitor.  Hypocalcemia: Corrected Calcium is slowly improving. Have supplemented with 2 amps of calcium gluconate again this morning. Will recheck in the morning.  Hypoalbuminemia: Due to the patient's very poor nutritional status. Resulting in third spacing of vascular volume, orthostatic hypotension, and overall poor functional status.  Afib, on Eliquis: Rate is well controlled on Coreg, but this will be held for now given persistent  hypotension while in the ER. Continue Eliquis.  HTN: Restart Coreg. Blood pressures are well controlled.  DM: A1c was 8.8 on 5/21. Will hold NPH for now while NPO. Will cover with sensitive-scale SSI.  HLD: Continue Lipitor  Malnutrition: Encouraging PO intake.   RA: Enbrel has been held since before last hospitalization, roughly 5/15. Prednisone was recently increased from 5 to 10 mg daily. For now, will use stress-dosed steroids with hydrocortisone 50 mg IV q6h, as patient is NPO.  Note: This patient has been tested and is negative for the novel coronavirus COVID-19.  I have seen and examined this patient myself. I have spent 45 minutes in her evaluation and care.  I called and spoke to her daughter [name] over the phone. All questions answered to the best of my ability.  DVT prophylaxis: Eliquis Code Status:  DNR - confirmed with patient/family Family Communication: Daughter was present throughout hospitalization  Disposition Plan:  Home once clinically improved  Vonna Brabson, DO [facility] Direct contact: see [email or web address]  7PM-7AM contact night coverage as above  08/31/2018, 3:37 PM  LOS: 1 day

## 2018-09-01 ENCOUNTER — Inpatient Hospital Stay (HOSPITAL_COMMUNITY): Payer: Medicare Other

## 2018-09-01 ENCOUNTER — Ambulatory Visit: Payer: Medicare Other | Admitting: Physician Assistant

## 2018-09-01 LAB — COMPREHENSIVE METABOLIC PANEL
ALT: 10 U/L (ref 0–44)
AST: 12 U/L — ABNORMAL LOW (ref 15–41)
Albumin: 1.8 g/dL — ABNORMAL LOW (ref 3.5–5.0)
Alkaline Phosphatase: 41 U/L (ref 38–126)
Anion gap: 8 (ref 5–15)
BUN: 15 mg/dL (ref 8–23)
CO2: 19 mmol/L — ABNORMAL LOW (ref 22–32)
Calcium: 6.9 mg/dL — ABNORMAL LOW (ref 8.9–10.3)
Chloride: 116 mmol/L — ABNORMAL HIGH (ref 98–111)
Creatinine, Ser: 0.9 mg/dL (ref 0.44–1.00)
GFR calc Af Amer: >60 mL/min (ref >60)
GFR calc non Af Amer: 59 mL/min — ABNORMAL LOW (ref >60)
Glucose, Bld: 160 mg/dL — ABNORMAL HIGH (ref 70–99)
Potassium: 3.4 mmol/L — ABNORMAL LOW (ref 3.5–5.1)
Sodium: 143 mmol/L (ref 135–145)
Total Bilirubin: 0.2 mg/dL — ABNORMAL LOW (ref 0.3–1.2)
Total Protein: 3.8 g/dL — ABNORMAL LOW (ref 6.5–8.1)

## 2018-09-01 LAB — CULTURE, BLOOD (ROUTINE X 2)
Culture: NO GROWTH
Culture: NO GROWTH
Special Requests: ADEQUATE

## 2018-09-01 LAB — GLUCOSE, CAPILLARY
Glucose-Capillary: 134 mg/dL — ABNORMAL HIGH (ref 70–99)
Glucose-Capillary: 135 mg/dL — ABNORMAL HIGH (ref 70–99)
Glucose-Capillary: 126 mg/dL — ABNORMAL HIGH (ref 70–99)
Glucose-Capillary: 174 mg/dL — ABNORMAL HIGH (ref 70–99)

## 2018-09-01 LAB — CBC WITH DIFFERENTIAL/PLATELET
Abs Immature Granulocytes: 0.24 10*3/uL — ABNORMAL HIGH (ref 0.00–0.07)
Basophils Absolute: 0 10*3/uL (ref 0.0–0.1)
Basophils Relative: 0 %
Eosinophils Absolute: 0 10*3/uL (ref 0.0–0.5)
Eosinophils Relative: 0 %
HCT: 25.2 % — ABNORMAL LOW (ref 36.0–46.0)
Hemoglobin: 7.9 g/dL — ABNORMAL LOW (ref 12.0–15.0)
Immature Granulocytes: 3 %
Lymphocytes Relative: 13 %
Lymphs Abs: 1.1 10*3/uL (ref 0.7–4.0)
MCH: 28.6 pg (ref 26.0–34.0)
MCHC: 31.3 g/dL (ref 30.0–36.0)
MCV: 91.3 fL (ref 80.0–100.0)
Monocytes Absolute: 0.2 10*3/uL (ref 0.1–1.0)
Monocytes Relative: 2 %
Neutro Abs: 6.7 10*3/uL (ref 1.7–7.7)
Neutrophils Relative %: 82 %
Platelets: 281 10*3/uL (ref 150–400)
RBC: 2.76 MIL/uL — ABNORMAL LOW (ref 3.87–5.11)
RDW: 17.5 % — ABNORMAL HIGH (ref 11.5–15.5)
WBC: 8.2 10*3/uL (ref 4.0–10.5)
nRBC: 0 % (ref 0.0–0.2)

## 2018-09-01 MED ORDER — PRO-STAT SUGAR FREE PO LIQD
30.0000 mL | Freq: Two times a day (BID) | ORAL | Status: DC
  Administered 2018-09-01 – 2018-09-02 (×4): 30 mL via ORAL
  Filled 2018-09-01 (×5): qty 30

## 2018-09-01 MED ORDER — CALCIUM GLUCONATE-NACL 2-0.675 GM/100ML-% IV SOLN
2.0000 g | Freq: Once | INTRAVENOUS | Status: AC
  Administered 2018-09-01: 2000 mg via INTRAVENOUS
  Filled 2018-09-01: qty 100

## 2018-09-01 MED ORDER — POTASSIUM CHLORIDE 10 MEQ/100ML IV SOLN
10.0000 meq | INTRAVENOUS | Status: AC
  Administered 2018-09-01 (×2): 10 meq via INTRAVENOUS
  Filled 2018-09-01 (×2): qty 100

## 2018-09-01 MED ORDER — DM-GUAIFENESIN ER 30-600 MG PO TB12: 1.0000 | ORAL_TABLET | Freq: Two times a day (BID) | ORAL | Status: DC

## 2018-09-01 MED ORDER — ENSURE ENLIVE PO LIQD
237.0000 mL | Freq: Two times a day (BID) | ORAL | Status: DC
  Administered 2018-09-01 – 2018-09-02 (×3): 237 mL via ORAL

## 2018-09-01 MED ORDER — DM-GUAIFENESIN ER 30-600 MG PO TB12
1.0000 | ORAL_TABLET | Freq: Two times a day (BID) | ORAL | Status: DC | PRN
  Administered 2018-09-01: 1 via ORAL
  Filled 2018-09-01: qty 1

## 2018-09-01 NOTE — Progress Notes (Signed)
PROGRESS NOTE  Shelby Walker QZE:[id number] DOB: [birth year] DOA: 08/27/2018 PCP: Reynold Bowen, MD  Brief History    Shelby Walker is a 85 y.o. female with medical history significant of RA; HLD; and DM presenting with diarrhea.  Symptoms started May 6 - went to ER and diagnosed with diverticulitis.  Sent home with 2 antibiotics.  Slight improvement and then worsened.  On May 22, went back to [facility] and had + C  Diff testing, so admitted at [facility].  She ended up in ICU for about a week and then spent another week on the floor.  She then went to CIR for 2 weeks.  She went home on 6/20.  She was doing very well.  Late Sunday, she developed cramping abdominal pain and started with recurrent diarrhea.  She vomited twice on Monday.  The diarrhea worsened in frequency and amounts, with incontinence.  Slight fever to 100 one day but otherwise no fever.  [facility] came in early in the week and daughter has been closely tracking her vitals.  Her BP has bene low but not in the 80s - has mostly been 90/45.  Last night it was 98/50.   Her normal is maybe 116.  She is light-headed/dizzy with standing.  Last BM was about 630 this AM, none in ER.  She was having them hourly this AM.  She was recommended to restart oral Vanc yesterday.  She tends to have stools with ambulation/transfers.  She is not on Enbrel right now and so rheum yesterday increased prednisone from 5 to 10 mg daily.  Last dose of Enbrel was about 5/15.   Admitted for diverticulitis in May, went to rehab, got C. Diff.  Released 4 days ago, worsening.  Started on oral Vanc last night.  Soft BPs, nonspecific colitis on CT.  Needs ongoing treatment.  COVID negative. Bowel movements are no longer watery and are considerably fewer. The patient has multiple electrolyte derangements due to her very poor PO intake. These are followed daily and supplemented.  Consultants  . Infectious disease  Procedures  . None  Antibiotics  . Oral Vancomycin  Subjective  The  patient's blood pressures are improved this morning.  Objective   Vitals:  Vitals:   09/01/18 0800 09/01/18 1609  BP: (!) 150/84 (!) 156/68  Pulse: 70 72  Resp: 18 18  Temp: 98 F (36.7 C) 98.4 F (36.9 C)  SpO2: 98% 100%    Exam:  Constitutional:  . The patient is awake, alert, and oriented x 3. No acute distress. Respiratory:  . No wheezes, rales, or rhonchi. . No tactile fremitus . No increased work of breathing. Cardiovascular:  . Regular rate and rhythm. . No murmurs, ectopy, or gallups. . No lateral PMI. No thrills. Abdomen:  . Abdomen is soft, non-tender, non-distended . No hernias, masses, or hernias are appreciated. . Hyperactive bowel sounds. Musculoskeletal:  . No cyanosis, clubbing, or edema Skin:  . No rashes, lesions, ulcers . palpation of skin: no induration or nodules Neurologic:  . CN 2-12 intact . Sensation all 4 extremities intact Psychiatric:  . Mental status o Mood, affect appropriate o Orientation to person, place, time  . judgment and insight appear intact    I have personally reviewed the following:   Today's Data  . BMP, CBC, I's and O's, and Vitals  Scheduled Meds: . apixaban  2.5 mg Oral BID  . atorvastatin  20 mg Oral QHS  . carvedilol  3.125 mg Oral BID  WC  . cholestyramine  4 g Oral BID  . dicyclomine  10 mg Oral TID AC & HS  . feeding supplement (ENSURE ENLIVE)  237 mL Oral BID BM  . feeding supplement (PRO-STAT SUGAR FREE 64)  30 mL Oral BID  . hydrocortisone sod succinate (SOLU-CORTEF) inj  50 mg Intravenous Q6H  . insulin aspart  0-5 Units Subcutaneous QHS  . insulin aspart  0-9 Units Subcutaneous TID WC  . insulin NPH Human  5 Units Subcutaneous BID AC & HS  . megestrol  40 mg Oral Daily  . sodium chloride flush  3 mL Intravenous Q12H  . vancomycin  125 mg Oral QID   Followed by  . [START ON 09/11/2018] vancomycin  125 mg Oral BID   Followed by  . [START ON 09/18/2018] vancomycin  125 mg Oral Daily   Followed by   . [START ON 09/25/2018] vancomycin  125 mg Oral QODAY   Followed by  . [START ON 10/03/2018] vancomycin  125 mg Oral Q3 days   Continuous Infusions: . sodium chloride 75 mL/hr at 09/01/18 1212    Principal Problem:   C. difficile colitis Active Problems:   Rheumatoid arthritis ([place])   Mixed hyperlipidemia   AKI (acute kidney injury) ([place])   Diabetes mellitus type 2 in nonobese (HCC)   PAF (paroxysmal atrial fibrillation) (HCC)   Hypoalbuminemia due to protein-calorie malnutrition (HCC)   Essential hypertension   Pressure injury of skin   LOS: 5 days    A & P  Recurrent C diff colitis: Patient was admitted from 5/21-6/3 for septic shock due to C diff. She was discharged to rehab and has done well until the last 2-3 days. Repeat abdominal cramping with refractory diarrhea and incontinence. Patient discussed with ID. Will treat with PO Vancomycin 500 mg BID for a 21 day taper. Although there is no need at this time, fecal transplant may start back in July; can consider if patient continues to have recurrent/refractory issues. Diarrhea has stopped. The patient had 3 BM's yesterday and two today. The consistency is no longer watery, but is now pudding like. There is no need for GI consult at this time. PT/OT to determine discharge needs, but the patient's ability to participate with PT/OT is limited due to the patient's continuing orthostasis. This appears to be resolved today.  AKI on stage 3 CKD: Creatinine is improved at 0.90 this morning. Monitor. Continue IV fluids cautiously. As noted above, this is very likely related to prerenal azotemia in the setting of decreased PO intake and excessive GI losses.  Metabolic acidosis: Improving. Due to GI losses of bicarbonate. Anion Gap is closing. Monitor.  Hypocalcemia: Corrected Calcium is slowly improving. Have supplemented with 2 amps of calcium gluconate again this morning. Will recheck in the morning.  Hypoalbuminemia: Due to the  patient's very poor nutritional status. Resulting in third spacing of vascular volume, orthostatic hypotension, and overall poor functional status. Megace added to improve appetite.  Afib, on Eliquis: Rate is well controlled on Coreg, but this will be held for now given persistent hypotension while in the ER. Continue Eliquis.  HTN: Restart Coreg. Blood pressures are well controlled.  DM: A1c was 8.8 on 5/21. Will hold NPH for now while NPO. Will cover with sensitive-scale SSI.  HLD: Continue Lipitor  Malnutrition: Encouraging PO intake. Nutrition is consulted.  RA: Enbrel has been held since before last hospitalization, roughly 5/15. Prednisone was recently increased from 5 to 10 mg daily. For now,  will use stress-dosed steroids with hydrocortisone 50 mg IV q6h, as patient is NPO.  Note: This patient has been tested and is negative for the novel coronavirus COVID-19.  I have seen and examined this patient myself. I have spent 42 minutes in her evaluation and care. I called and spoke to her daughter [name] over the phone. All questions answered to the best of my ability. I have suggested to nursing that the daughter would like to come up to visit her mother.  DVT prophylaxis: Eliquis Code Status:  DNR - confirmed with patient/family Family Communication: Daughter was present throughout hospitalization  Disposition Plan:  Home once clinically improved  Bryttney Netzer, DO [facility] Direct contact: see [email or web address]  7PM-7AM contact night coverage as above  09/01/2018, 5:42 PM  LOS: 1 day

## 2018-09-01 NOTE — Progress Notes (Signed)
Nutrition Follow-up  DOCUMENTATION CODES:   Not applicable  INTERVENTION:  Ensure Enlive po BID, each supplement provides 350 kcal and 20 grams of protein Prostat po BID, each supplement provides 100 kcal and 15 grams of protein Magic cup TID with meals, each supplement provides 290 kcal and 9 grams of protein   NUTRITION DIAGNOSIS:   Inadequate oral intake related to altered GI function(C-diff) as evidenced by energy intake < or equal to 50% for > or equal to 5 days.   GOAL:   Patient will meet greater than or equal to 90% of their needs  Unmet; progressing  MONITOR:   PO intake, Labs, Supplement acceptance, Weight trends, I & O's, Skin  REASON FOR ASSESSMENT:   Consult Calorie Count, Poor PO  ASSESSMENT:   85 year old female with medical history significant of RA, HLD, T2DM, CAD, colitis, diverticulitis. Patient admitted for diverticulitis in May, went to rehab where she contracted C.Diff and d/c on 6/20  from [facility]. Patient returned to hospital on 6/24 with abdominal pain, recurrent diarrhea and vomiting  Weights reviewed - 9lb wt loss noted since 5/06  6.8% change in wt x 1 month (significant for time frame) Suspect mild/moderate malnutrition considering history of wt loss, recurrent C-diff, and hospital LOS prior to current admission.  Consulted for cal ct on 6/24 - inappropriate at that time d/t CL diet order. One documented meal since diet advancement to regular on 6/26.   Attempted to contact patient via phone on 2 occassions - pt did not answer. Spoke with RN this afternoon. She reports some improvements with BM today; pt having more type 5/6.   RN discussed eating habits with patient and states that patient recalls never being a big eater. RN recalls a conversation with the daughter over the weekend. Daughter reports her mother drinking/liking ONS during last admission.   Medications reviewed and include: Bentyl, SSI, NPH 5 units BID, Megace, Vancomycin  Labs:  Glucose 135 (H) NUTRITION - FOCUSED PHYSICAL EXAM:  Unable to access - pt on enteric precautions  Diet Order:  75% x 1 recorded meal since admission Diet Order            Diet regular Room service appropriate? Yes; Fluid consistency: Thin  Diet effective now              EDUCATION NEEDS:   No education needs have been identified at this time  Skin:  Skin Assessment: Skin Integrity Issues: Skin Integrity Issues:: Stage I Stage I: sacrum  Last BM:  6/28  Height:   Ht Readings from Last 1 Encounters:  08/07/18 5\' 1"  (1.549 m)    Weight:   Wt Readings from Last 1 Encounters:  08/23/18 56.1 kg    Ideal Body Weight:  47.7 kg  BMI:  There is no height or weight on file to calculate BMI.  Estimated Nutritional Needs:   Kcal:  1200-1350  Protein:  67-79g  Fluid:  >/=1.3L    Lajuan Lines, RD, LDN  After Hours/Weekend Pager: [phone]

## 2018-09-01 NOTE — Progress Notes (Signed)
Pt c/o SOB and 7/10 chest soreness. Pt says her left upper chest feels sore and that it feels like some phlegm is caught in her through, but she can't effectively cough it up. O2 sats 99% on RA. BP 157/71. NP Bodenheimer paged and made aware. Will continue to monitor and treat per orders.

## 2018-09-01 NOTE — Progress Notes (Signed)
Physical Therapy Treatment Patient Details Name: Shelby Walker MRN: [id number] DOB: Nov 29, 1933 Today's Date: 09/01/2018    History of Present Illness Patient is an 85 year old female who presented to the hopital with loose stools. She was diagnosed with C-diff. Today she reports it has improved. PMH: RA, psoriasis, palpations, osteoperosis, DMII    PT Comments    Patient seen for mobility progression. Pt is making progress toward PT goals and tolerated increased gait distance this session. Pt reports bilat LE heaviness and feeling like she has "more fluid" than a few days ago. Pt will continue to benefit from further skilled PT services to maximize independence and safety with mobility.    Follow Up Recommendations  Home health PT     Equipment Recommendations  None recommended by PT    Recommendations for Other Services       Precautions / Restrictions Precautions Precautions: Fall Restrictions Weight Bearing Restrictions: No    Mobility  Bed Mobility Overal bed mobility: Modified Independent Bed Mobility: Supine to Sit           General bed mobility comments: increased time and effort needed  Transfers Overall transfer level: Needs assistance Equipment used: Rolling walker (2 wheeled) Transfers: Sit to/from Stand Sit to Stand: Min guard         General transfer comment: pt needed to sit EOB a few minutes prior to attempt to stand due to feeling "a little lightheaded"; min guard for safety; cues for safe hand placement   Ambulation/Gait Ambulation/Gait assistance: Min guard;Min assist Gait Distance (Feet): 100 Feet Assistive device: Rolling walker (2 wheeled) Gait Pattern/deviations: Step-through pattern;Decreased stride length;Trunk flexed Gait velocity: decreased   General Gait Details: cues for upright posture; unsteady with increased assist required at times but no LOB   Stairs             Wheelchair Mobility    Modified Rankin (Stroke  Patients Only)       Balance Overall balance assessment: Needs assistance Sitting-balance support: No upper extremity supported;Feet supported Sitting balance-Leahy Scale: Good     Standing balance support: Bilateral upper extremity supported Standing balance-Leahy Scale: Fair                              Cognition Arousal/Alertness: Awake/alert Behavior During Therapy: WFL for tasks assessed/performed Overall Cognitive Status: Within Functional Limits for tasks assessed                                 General Comments: very pleasent      Exercises Other Exercises Other Exercises: bilat ankle pumps    General Comments  bilat LE edema      Pertinent Vitals/Pain Pain Assessment: Faces Faces Pain Scale: Hurts a little bit Pain Location: stomach and bilat LE Pain Descriptors / Indicators: Discomfort;Heaviness Pain Intervention(s): Monitored during session;Repositioned    Home Living                      Prior Function            PT Goals (current goals can now be found in the care plan section) Acute Rehab PT Goals Patient Stated Goal: to get stronger Progress towards PT goals: Progressing toward goals    Frequency    Min 3X/week      PT Plan Current plan remains appropriate  Co-evaluation              AM-PAC PT "6 Clicks" Mobility   Outcome Measure  Help needed turning from your back to your side while in a flat bed without using bedrails?: A Little Help needed moving from lying on your back to sitting on the side of a flat bed without using bedrails?: A Little Help needed moving to and from a bed to a chair (including a wheelchair)?: A Little Help needed standing up from a chair using your arms (e.g., wheelchair or bedside chair)?: A Little Help needed to walk in hospital room?: A Little Help needed climbing 3-5 steps with a railing? : A Little 6 Click Score: 18    End of Session Equipment Utilized  During Treatment: Gait belt Activity Tolerance: Patient tolerated treatment well Patient left: in chair;with call bell/phone within reach;with chair alarm set Nurse Communication: Mobility status PT Visit Diagnosis: Other abnormalities of gait and mobility (R26.89)     Time: 1315-1340 PT Time Calculation (min) (ACUTE ONLY): 25 min  Charges:  $Gait Training: 23-37 mins                     Shelby Walker, PTA Acute Rehabilitation Services Pager: [phone] Office: [phone]     Shelby Walker 09/01/2018, 2:04 PM

## 2018-09-01 NOTE — Progress Notes (Signed)
Occupational Therapy Treatment Patient Details Name: Shelby Walker MRN: [id number] DOB: 1933/08/06 Today's Date: 09/01/2018    History of present illness Patient is an 85 year old female who presented to the hopital with loose stools. She was diagnosed with C-diff. Today she reports it has improved. PMH: RA, psoriasis, palpations, osteoperosis, DMII   OT comments  Pt slowly progressing towards OT goals. Engaged in toileting on [facility] from recliner. Pt required Min guard functional transfer with VCs for safety and Min guard for toilet hygiene for safety. Pt will benefit from continued acute OT to address weakness, safe engagement in ADLs, and functional transfers to prepare for safe transition to next venue. DC and freq remains the same OT will continue to follow acutely.    Follow Up Recommendations  Home health OT;Supervision/Assistance - 24 hour    Equipment Recommendations  None recommended by OT    Recommendations for Other Services      Precautions / Restrictions Precautions Precautions: Fall Precaution Comments: left knee buckles Restrictions Weight Bearing Restrictions: No       Mobility Bed Mobility Overal bed mobility: Modified Independent Bed Mobility: Sit to Supine       Sit to supine: Mod assist   General bed mobility comments: Min A to manage BLE to enter bed.   Transfers Overall transfer level: Needs assistance Equipment used: Rolling walker (2 wheeled) Transfers: Sit to/from Stand Sit to Stand: Min guard         General transfer comment: pt needed to sit EOB a few minutes prior to attempt to stand due to feeling "a little lightheaded"; min guard for safety; cues for safe hand placement     Balance Overall balance assessment: Needs assistance Sitting-balance support: No upper extremity supported;Feet supported Sitting balance-Leahy Scale: Good     Standing balance support: Bilateral upper extremity supported Standing balance-Leahy Scale:  Fair Standing balance comment: good                           ADL either performed or assessed with clinical judgement   ADL Overall ADL's : Needs assistance/impaired                         Toilet Transfer: Min guard;Ambulation;RW Toilet Transfer Details (indicate cue type and reason): transfer to [place] and Hygiene: Min guard;Sit to/from stand Toileting - Clothing Manipulation Details (indicate cue type and reason): incontinent of stool     Functional mobility during ADLs: Min guard;Rolling walker General ADL Comments: Pt performed toileting and hygiene with RW. VCs for hand placement.     Vision   Vision Assessment?: No apparent visual deficits   Perception     Praxis      Cognition Arousal/Alertness: Awake/alert Behavior During Therapy: WFL for tasks assessed/performed Overall Cognitive Status: Within Functional Limits for tasks assessed                                 General Comments: very pleasent        Exercises Total Joint Exercises Bridges: Strengthening;10 reps;Both;Supine General Exercises - Lower Extremity Long Arc Quad: (3# cuff weights) Hip ABduction/ADduction: (orange theraband) Other Exercises Other Exercises: bilat ankle pumps   Shoulder Instructions       General Comments      Pertinent Vitals/ Pain       Pain Assessment: Faces  Faces Pain Scale: Hurts a little bit Pain Location: stomach and bilat LE Pain Descriptors / Indicators: Discomfort;Heaviness Pain Intervention(s): Monitored during session;Repositioned  Home Living                                          Prior Functioning/Environment              Frequency  Min 2X/week        Progress Toward Goals  OT Goals(current goals can now be found in the care plan section)  Progress towards OT goals: Progressing toward goals  Acute Rehab OT Goals Patient Stated Goal: to get stronger OT  Goal Formulation: With patient Time For Goal Achievement: 09/12/18 Potential to Achieve Goals: Good ADL Goals Pt Will Perform Grooming: with min assist;standing Pt Will Perform Upper Body Bathing: with min assist;sitting Pt Will Perform Lower Body Bathing: with min assist;sitting/lateral leans;sit to/from stand;with adaptive equipment Pt Will Perform Upper Body Dressing: with min assist;sitting Pt Will Perform Lower Body Dressing: with modified independence;sit to/from stand Pt Will Transfer to Toilet: with modified independence;ambulating;bedside commode Pt Will Perform Toileting - Clothing Manipulation and hygiene: with modified independence;sit to/from stand Pt/caregiver will Perform Home Exercise Program: Both right and left upper extremity;With theraband;With written HEP provided;Independently Additional ADL Goal #1: Pt will be able to identify and implement 2-3 energy conservation strategies during ADLs.  Plan Discharge plan remains appropriate    Co-evaluation                 AM-PAC OT "6 Clicks" Daily Activity     Outcome Measure   Help from another person eating meals?: None Help from another person taking care of personal grooming?: A Little(in standing) Help from another person toileting, which includes using toliet, bedpan, or urinal?: A Little Help from another person bathing (including washing, rinsing, drying)?: A Little Help from another person to put on and taking off regular upper body clothing?: A Little Help from another person to put on and taking off regular lower body clothing?: A Little 6 Click Score: 19    End of Session Equipment Utilized During Treatment: Gait belt;Rolling walker  OT Visit Diagnosis: Other abnormalities of gait and mobility (R26.89);Unsteadiness on feet (R26.81);Muscle weakness (generalized) (M62.81);History of falling (Z91.81)   Activity Tolerance Patient tolerated treatment well   Patient Left in bed;with call bell/phone within  reach   Nurse Communication Mobility status        Time: 4081-4481 OT Time Calculation (min): 21 min  Charges: OT General Charges $OT Visit: 1 Visit OT Treatments $Self Care/Home Management : 8-22 mins  Minus Breeding, MSOT, OTR/L  Supplemental Rehabilitation Services  [phone]    Marius Ditch 09/01/2018, 4:24 PM

## 2018-09-02 LAB — COMPREHENSIVE METABOLIC PANEL
ALT: 9 U/L (ref 0–44)
AST: 12 U/L — ABNORMAL LOW (ref 15–41)
Albumin: 1.8 g/dL — ABNORMAL LOW (ref 3.5–5.0)
Alkaline Phosphatase: 41 U/L (ref 38–126)
Anion gap: 7 (ref 5–15)
BUN: 23 mg/dL (ref 8–23)
CO2: 19 mmol/L — ABNORMAL LOW (ref 22–32)
Calcium: 7.4 mg/dL — ABNORMAL LOW (ref 8.9–10.3)
Chloride: 116 mmol/L — ABNORMAL HIGH (ref 98–111)
Creatinine, Ser: 1 mg/dL (ref 0.44–1.00)
GFR calc Af Amer: >60 mL/min (ref >60)
GFR calc non Af Amer: 52 mL/min — ABNORMAL LOW (ref >60)
Glucose, Bld: 289 mg/dL — ABNORMAL HIGH (ref 70–99)
Potassium: 3.4 mmol/L — ABNORMAL LOW (ref 3.5–5.1)
Sodium: 142 mmol/L (ref 135–145)
Total Bilirubin: 0.2 mg/dL — ABNORMAL LOW (ref 0.3–1.2)
Total Protein: 3.9 g/dL — ABNORMAL LOW (ref 6.5–8.1)

## 2018-09-02 LAB — GLUCOSE, CAPILLARY
Glucose-Capillary: 238 mg/dL — ABNORMAL HIGH (ref 70–99)
Glucose-Capillary: 244 mg/dL — ABNORMAL HIGH (ref 70–99)
Glucose-Capillary: 132 mg/dL — ABNORMAL HIGH (ref 70–99)
Glucose-Capillary: 130 mg/dL — ABNORMAL HIGH (ref 70–99)

## 2018-09-02 MED ORDER — CHOLESTYRAMINE 4 G PO PACK: 4.0000 g | PACK | Freq: Two times a day (BID) | ORAL | 12 refills | Status: DC

## 2018-09-02 MED ORDER — VANCOMYCIN HCL 125 MG PO CAPS: 125.0000 mg | ORAL_CAPSULE | ORAL | 0 refills | Status: DC

## 2018-09-02 MED ORDER — ONDANSETRON HCL 4 MG PO TABS: 4.0000 mg | ORAL_TABLET | Freq: Every day | ORAL | 1 refills | Status: AC | PRN

## 2018-09-02 MED ORDER — DM-GUAIFENESIN ER 30-600 MG PO TB12: 1.0000 | ORAL_TABLET | Freq: Two times a day (BID) | ORAL | 0 refills | Status: DC | PRN

## 2018-09-02 MED ORDER — PRO-STAT SUGAR FREE PO LIQD: 30.0000 mL | Freq: Two times a day (BID) | ORAL | 0 refills | Status: DC

## 2018-09-02 MED ORDER — VANCOMYCIN HCL 125 MG PO CAPS: ORAL_CAPSULE | ORAL | 0 refills | Status: AC

## 2018-09-02 MED ORDER — LORAZEPAM 2 MG/ML IJ SOLN
0.5000 mg | Freq: Once | INTRAMUSCULAR | Status: AC
  Administered 2018-09-02: 0.5 mg via INTRAVENOUS
  Filled 2018-09-02: qty 1

## 2018-09-02 MED ORDER — CALCIUM GLUCONATE-NACL 2-0.675 GM/100ML-% IV SOLN
2.0000 g | Freq: Once | INTRAVENOUS | Status: AC
  Administered 2018-09-02: 2000 mg via INTRAVENOUS
  Filled 2018-09-02: qty 100

## 2018-09-02 MED ORDER — VANCOMYCIN HCL 125 MG PO CAPS: ORAL_CAPSULE | ORAL | 0 refills | Status: DC

## 2018-09-02 MED ORDER — ENSURE ENLIVE PO LIQD: 237.0000 mL | Freq: Two times a day (BID) | ORAL | 12 refills | Status: DC

## 2018-09-02 MED ORDER — DICYCLOMINE HCL 10 MG PO CAPS: 10.0000 mg | ORAL_CAPSULE | Freq: Three times a day (TID) | ORAL | 0 refills | Status: DC

## 2018-09-02 MED ORDER — MEGESTROL ACETATE 40 MG PO TABS: 40.0000 mg | ORAL_TABLET | Freq: Every day | ORAL | 0 refills | Status: DC

## 2018-09-02 MED ORDER — CALCIUM GLUCONATE-NACL 1-0.675 GM/50ML-% IV SOLN: 1.0000 g | Freq: Once | INTRAVENOUS | Status: DC

## 2018-09-02 MED FILL — MEGESTROL 40 MG TABLET: 40 | 30 days supply | Qty: 30 | Fill #0

## 2018-09-02 MED FILL — DICYCLOMINE 10 MG CAPSULE: 10 | 8 days supply | Qty: 30 | Fill #0

## 2018-09-02 MED FILL — VANCOMYCIN HCL 125 MG CAP: 125 | 10 days supply | Qty: 40 | Fill #0

## 2018-09-02 MED FILL — ONDANSETRON HCL 4 MG TABLET: 4 | 30 days supply | Qty: 30 | Fill #0

## 2018-09-02 MED FILL — MUCUS RELIEF DM 30-600 MG T: 30-600 | 10 days supply | Qty: 20 | Fill #0

## 2018-09-02 MED FILL — CHOLESTYRAMINE PACKET: 4 | 30 days supply | Qty: 60 | Fill #0

## 2018-09-02 NOTE — Progress Notes (Signed)
Spoke with daughter [name] about visitation policy.  Did explain that mom is alert and oriented and we can not have visitors at this time.  However, committed to nursing staff updating her about her meds and plan of care once per shift.  Communicated plan to RN and charge RN to follow up with other nurses.

## 2018-09-02 NOTE — Progress Notes (Signed)
PROGRESS NOTE  Shelby Walker XBL:[id number] DOB: 1933/07/16 DOA: 08/27/2018 PCP: Reynold Bowen, MD  Brief History    Shelby Walker is a 85 y.o. female with medical history significant of RA; HLD; and DM presenting with diarrhea.  Symptoms started May 6 - went to ER and diagnosed with diverticulitis.  Sent home with 2 antibiotics.  Slight improvement and then worsened.  On May 22, went back to [facility] and had + C  Diff testing, so admitted at [facility].  She ended up in ICU for about a week and then spent another week on the floor.  She then went to CIR for 2 weeks.  She went home on 6/20.  She was doing very well.  Late Sunday, she developed cramping abdominal pain and started with recurrent diarrhea.  She vomited twice on Monday.  The diarrhea worsened in frequency and amounts, with incontinence.  Slight fever to 100 one day but otherwise no fever.  [facility] came in early in the week and daughter has been closely tracking her vitals.  Her BP has bene low but not in the 80s - has mostly been 90/45.  Last night it was 98/50.   Her normal is maybe 116.  She is light-headed/dizzy with standing.  Last BM was about 630 this AM, none in ER.  She was having them hourly this AM.  She was recommended to restart oral Vanc yesterday.  She tends to have stools with ambulation/transfers.  She is not on Enbrel right now and so rheum yesterday increased prednisone from 5 to 10 mg daily.  Last dose of Enbrel was about 5/15.   Admitted for diverticulitis in May, went to rehab, got C. Diff.  Released 4 days ago, worsening.  Started on oral Vanc last night.  Soft BPs, nonspecific colitis on CT.  Needs ongoing treatment.  COVID negative. Bowel movements are no longer watery and are considerably fewer. The patient has multiple electrolyte derangements due to her very poor PO intake. These are followed daily and supplemented.  Consultants  . Infectious disease  Procedures  . None  Antibiotics  . Oral Vancomycin  Subjective  The  patient's blood pressures are improved this morning. She is still sleeping when I visit this morning. Per nursing report the patient vomited after trying the prostat liquid last night.  Objective   Vitals:  Vitals:   09/02/18 0901 09/02/18 1303  BP: (!) 144/58 (!) 147/65  Pulse: 67 75  Resp: 16 16  Temp: 98.5 F (36.9 C) 98.8 F (37.1 C)  SpO2: 93% 100%    Exam:  Constitutional:  . The patient is awake, alert, and oriented x 3. No acute distress. Respiratory:  . No wheezes, rales, or rhonchi. . No tactile fremitus . No increased work of breathing. Cardiovascular:  . Regular rate and rhythm. . No murmurs, ectopy, or gallups. . No lateral PMI. No thrills. Abdomen:  . Abdomen is soft, non-tender, non-distended . No hernias, masses, or hernias are appreciated. . Hyperactive bowel sounds. Musculoskeletal:  . No cyanosis, clubbing, or edema Skin:  . No rashes, lesions, ulcers . palpation of skin: no induration or nodules Neurologic:  . CN 2-12 intact . Sensation all 4 extremities intact Psychiatric:  . Mental status o Mood, affect appropriate o Orientation to person, place, time  . judgment and insight appear intact    I have personally reviewed the following:   Today's Data  . BMP, CBC, I's and O's, and Vitals  Scheduled Meds: .  apixaban  2.5 mg Oral BID  . atorvastatin  20 mg Oral QHS  . carvedilol  3.125 mg Oral BID WC  . cholestyramine  4 g Oral BID  . dicyclomine  10 mg Oral TID AC & HS  . feeding supplement (ENSURE ENLIVE)  237 mL Oral BID BM  . feeding supplement (PRO-STAT SUGAR FREE 64)  30 mL Oral BID  . hydrocortisone sod succinate (SOLU-CORTEF) inj  50 mg Intravenous Q6H  . insulin aspart  0-5 Units Subcutaneous QHS  . insulin aspart  0-9 Units Subcutaneous TID WC  . insulin NPH Human  5 Units Subcutaneous BID AC & HS  . megestrol  40 mg Oral Daily  . sodium chloride flush  3 mL Intravenous Q12H  . vancomycin  125 mg Oral QID   Followed by  .  [START ON 09/11/2018] vancomycin  125 mg Oral BID   Followed by  . [START ON 09/18/2018] vancomycin  125 mg Oral Daily   Followed by  . [START ON 09/25/2018] vancomycin  125 mg Oral QODAY   Followed by  . [START ON 10/03/2018] vancomycin  125 mg Oral Q3 days   Continuous Infusions: . sodium chloride 75 mL/hr at 09/01/18 1212    Principal Problem:   C. difficile colitis Active Problems:   Rheumatoid arthritis ([place])   Mixed hyperlipidemia   AKI (acute kidney injury) ([place])   Diabetes mellitus type 2 in nonobese (HCC)   PAF (paroxysmal atrial fibrillation) (HCC)   Hypoalbuminemia due to protein-calorie malnutrition (HCC)   Essential hypertension   Pressure injury of skin   LOS: 6 days    A & P  Recurrent C diff colitis: Patient was admitted from 5/21-6/3 for septic shock due to C diff. She was discharged to rehab and has done well until the last 2-3 days. Repeat abdominal cramping with refractory diarrhea and incontinence. Patient discussed with ID. Will treat with PO Vancomycin 500 mg BID for a 21 day taper. Although there is no need at this time, fecal transplant may start back in July; can consider if patient continues to have recurrent/refractory issues. Diarrhea has stopped. The patient had 1 BM yesterday and two today. The consistency is becoming firmer. There is no need for GI consult at this time. PT/OT has recommended that the patient go home with home health PT.  AKI on stage 3 CKD: Resolved. Creatinine is improved at 0.90 this morning. Monitor. Continue IV fluids cautiously. As noted above, this is very likely related to prerenal azotemia in the setting of decreased PO intake and excessive GI losses.  Metabolic acidosis: Improving. Due to GI losses of bicarbonate. Anion Gap is closed. Monitor.  Hypocalcemia: Corrected Calcium is slowly improving. Have supplemented with 2 amps of calcium gluconate again this morning. Her calcium is now low-normal.   Hypoalbuminemia: Due to the  patient's very poor nutritional status. Resulting in third spacing of vascular volume, orthostatic hypotension, and overall poor functional status. Megace added to improve appetite. Nutritional supplements have been added, but the patient states that pro-stat liquid makes her sick. I will stop that.  Afib, on Eliquis: Rate is well controlled on Coreg, but this will be held for now given persistent hypotension while in the ER. Continue Eliquis.  HTN: Restart Coreg. Blood pressures are well controlled.  DM: A1c was 8.8 on 5/21. Will cover with sensitive-scale SSI.  HLD: Continue Lipitor  Malnutrition: Encouraging PO intake. Nutrition is consulted. Nutritional supplements are given. Prostat stopped, because patient states  it makes her sick.  RA: Enbrel has been held since before last hospitalization, roughly 5/15. Prednisone was recently increased from 5 to 10 mg daily. Will restart oral steroids.  Note: This patient has been tested and is negative for the novel coronavirus COVID-19.  I have seen and examined this patient myself. I have spent 48 minutes in her evaluation and care. I called and spoke to her daughter [name] over the phone. I told the daughter that the patient has reached the maximal benefit of her inpatient stay and is appropriate to go home. The daughter stated that she was very uncomfortable with taking the patient home this afternoon. The patient will be discharged tomorrow am.  DVT prophylaxis: Eliquis Code Status:  DNR - confirmed with patient/family Family Communication: I discussed the patient with the daughter today. Disposition Plan:  Home once clinically improved  Aubree Doody, DO [facility] Direct contact: see [email or web address]  7PM-7AM contact night coverage as above  09/02/2018, 4:21 PM  LOS: 1 day

## 2018-09-03 ENCOUNTER — Telehealth: Payer: Self-pay | Admitting: General Surgery

## 2018-09-03 ENCOUNTER — Other Ambulatory Visit: Payer: Self-pay

## 2018-09-03 LAB — GLUCOSE, CAPILLARY: Glucose-Capillary: 96 mg/dL (ref 70–99)

## 2018-09-03 NOTE — Discharge Summary (Signed)
Physician Discharge Summary  Shelby Walker MHW:[id number] DOB: 1933/03/17 DOA: 08/27/2018  PCP: Reynold Bowen, MD  Admit date: 08/27/2018 Discharge date: 09/03/2018  Recommendations for Outpatient Follow-up:  1. Pt will have home health PT/OT and RN. 2. Follow up with PCP in 7-10 days.  Follow-up Information    [facility] Follow up.   Specialty: Home Health Services Why: home health services arranged Contact information: [address] [place] [address] [phone]            Discharge Diagnoses: Principal diagnosis is #1 1. C Diff Colitis 2. Hypocalcemia 3. Metabolic acidosis 4. Hypoalbuminemia 5. Failure to thrive 6. Debility  Discharge Condition: Fair Disposition: Home with home health  Diet recommendation: As tolerated  Filed Weights   09/03/18 0449  Weight: 52.6 kg    History of present illness:  Shelby Lecker Kigeris a 85 y.o.femalewith medical history significant ofRA; HLD; and DM presenting with diarrhea.Symptoms started May 6 - went to ER and diagnosed with diverticulitis. Sent home with 2 antibiotics. Slight improvement and then worsened. On May 22, went back to [facility] and had + C Diff testing, so admitted at [facility]. She ended up in ICU for about a week and then spent another week on the floor. She then went to CIR for 2 weeks. She went home on 6/20. She was doing very well. Late Sunday, she developed cramping abdominal pain and started with recurrent diarrhea. She vomited twice on Monday. The diarrhea worsened in frequency and amounts, with incontinence. Slight fever to 100 one day but otherwise no fever. [facility] came in early in the week and daughter has been closely tracking her vitals. Her BP has bene low but not in the 80s - has mostly been 90/45. Last night it was 98/50. Her normal is maybe 116. She is light-headed/dizzy with standing. Last BM was about 630 this AM, none in ER. She was having them hourly this AM. She was  recommended to restart oral Vanc yesterday. She tends to have stools with ambulation/transfers. She is not on Enbrel right now and so rheum yesterday increased prednisone from 5 to 10 mg daily. Last dose of Enbrel was about 5/15.   Hospital Course:  Shelby Walker a 85 y.o.femalewith medical history significant ofRA; HLD; and DM presenting with diarrhea.Symptoms started May 6 - went to ER and diagnosed with diverticulitis. Sent home with 2 antibiotics. Slight improvement and then worsened. On May 22, went back to [facility] and had + C Diff testing, so admitted at [facility]. She ended up in ICU for about a week and then spent another week on the floor. She then went to CIR for 2 weeks. She went home on 6/20. She was doing very well. Late Sunday, she developed cramping abdominal pain and started with recurrent diarrhea. She vomited twice on Monday. The diarrhea worsened in frequency and amounts, with incontinence. Slight fever to 100 one day but otherwise no fever. [facility] came in early in the week and daughter has been closely tracking her vitals. Her BP has bene low but not in the 80s - has mostly been 90/45. Last night it was 98/50. Her normal is maybe 116. She is light-headed/dizzy with standing. Last BM was about 630 this AM, none in ER. She was having them hourly this AM. She was recommended to restart oral Vanc yesterday. She tends to have stools with ambulation/transfers. She is not on Enbrel right now and so rheum yesterday increased prednisone from 5  to 10 mg daily. Last dose of Enbrel was about 5/15.  Admitted for diverticulitis in May, went to rehab, got C. Diff. Released 4 days ago, worsening. Started on oral Vanc last night. Soft BPs, nonspecific colitis on CT. Needs ongoing treatment. COVID negative. Bowel movements are no longer watery and are considerably fewer. The patient has multiple electrolyte derangements due to her very poor PO intake. These are followed daily  and supplemented.   The patient is now tolerating her diet and having 1-3 semiformed stools a day. Calcium and acidosis have been addressed. PT recommends home with home health.  Today's assessment: S: The patient is dressed and is waiting in bed. No new complaints. O: Vitals:  Vitals:   09/03/18 0449 09/03/18 0912  BP: (!) 170/64 (!) 144/60  Pulse: 62 81  Resp: 16   Temp: 99 F (37.2 C)   SpO2: 100%     Constitutional:   The patient is awake, alert, and oriented x 3. No acute distress. Respiratory:   No wheezes, rales, or rhonchi.  No tactile fremitus  No increased work of breathing. Cardiovascular:   Regular rate and rhythm.  No murmurs, ectopy, or gallups.  No lateral PMI. No thrills. Abdomen:   Abdomen is soft, non-tender, non-distended  No hernias, masses, or hernias are appreciated.  Hyperactive bowel sounds. Musculoskeletal:   No cyanosis, clubbing, or edema Skin:   No rashes, lesions, ulcers  palpation of skin: no induration or nodules Neurologic:   CN 2-12 intact  Sensation all 4 extremities intact Psychiatric:   Mental status ? Mood, affect appropriate ? Orientation to person, place, time   judgment and insight appear intact   Discharge Instructions  Discharge Instructions    Activity as tolerated - No restrictions   Complete by: As directed    Call MD for:  persistant nausea and vomiting   Complete by: As directed    Call MD for:  severe uncontrolled pain   Complete by: As directed    Diet general   Complete by: As directed    Discharge instructions   Complete by: As directed    Home health PT/OT and RN for vitals and med checks. Follow up with PCP in 7-10 days. Follow up with rheumatology in 1 month.   Increase activity slowly   Complete by: As directed      Allergies as of 09/03/2018      Reactions   Compazine [prochlorperazine Edisylate] Other (See Comments)   REACTION: " tongue swell and unable to swallow"    Sulfonamide Derivatives Other (See Comments)   REACTION: " broke out with fine itching bumps"      Medication List    STOP taking these medications   Enbrel 50 MG/ML injection Generic drug: etanercept     TAKE these medications   acetaminophen 325 MG tablet Commonly known as: TYLENOL Take 2 tablets (650 mg total) by mouth every 6 (six) hours as needed for mild pain (or Fever >/= 101).   apixaban 2.5 MG Tabs tablet Commonly known as: ELIQUIS Take 1 tablet (2.5 mg total) by mouth 2 (two) times daily for 30 days.   atorvastatin 20 MG tablet Commonly known as: LIPITOR Take 1 tablet (20 mg total) by mouth daily. What changed: when to take this   Caltrate 600+D 600-400 MG-UNIT tablet Generic drug: Calcium Carbonate-Vitamin D Take 1 tablet by mouth daily at 12 noon.   carvedilol 3.125 MG tablet Commonly known as: COREG Take 1 tablet (3.125 mg  total) by mouth 2 (two) times daily with a meal.   CENTRUM SILVER 50+WOMEN PO Take 1 tablet by mouth daily.   Cholecalciferol 25 MCG (1000 UT) capsule Take 1 capsule (1,000 Units total) by mouth daily.   dextromethorphan-guaiFENesin 30-600 MG 12hr tablet Commonly known as: MUCINEX DM Take 1 tablet by mouth 2 (two) times daily as needed for cough.   dicyclomine 10 MG capsule Commonly known as: BENTYL Take 1 capsule (10 mg total) by mouth 4 (four) times daily -  before meals and at bedtime.   esomeprazole 40 MG capsule Commonly known as: NEXIUM Take 1 capsule (40 mg total) by mouth daily at 12 noon. What changed: when to take this   feeding supplement (ENSURE ENLIVE) Liqd Take 237 mLs by mouth 2 (two) times daily between meals.   Fish Oil 1200 MG Caps Take 1 capsule by mouth daily at 12 noon.   insulin NPH Human 100 UNIT/ML injection Commonly known as: NOVOLIN N Inject 0.05 mLs (5 Units total) into the skin 2 (two) times daily before a meal.   megestrol 40 MG tablet Commonly known as: MEGACE Take 1 tablet (40 mg total) by  mouth daily.   nitroGLYCERIN 0.4 MG SL tablet Commonly known as: NITROSTAT 1 TABLET UNDER TONGUE AT ONSET OF CHEST PAIN YOU MAY REPEAT EVERY 5 MINUTES FOR UP TO 3 DOSES. What changed: See the new instructions.   ondansetron 4 MG tablet Commonly known as: Zofran Take 1 tablet (4 mg total) by mouth daily as needed for nausea or vomiting.   oxyCODONE 5 MG immediate release tablet Commonly known as: Oxy IR/ROXICODONE Take 1 tablet (5 mg total) by mouth every 6 (six) hours as needed for moderate pain.   predniSONE 10 MG tablet Commonly known as: DELTASONE Take 10 mg by mouth daily.   PROBIOTIC-10 PO Take 1 capsule by mouth daily.   vancomycin 125 MG capsule Commonly known as: Vancocin HCl Take 1 capsule (125 mg total) by mouth 4 (four) times daily for 12 days, THEN 1 capsule (125 mg total) 2 (two) times a day for 7 days. Start taking on: September 02, 2018 What changed: You were already taking a medication with the same name, and this prescription was added. Make sure you understand how and when to take each.   vancomycin 125 MG capsule Commonly known as: Vancocin HCl Take 1 capsule (125 mg total) by mouth daily for 7 days, THEN 1 capsule (125 mg total) every other day for 8 days. Start taking on: September 18, 2018 What changed: You were already taking a medication with the same name, and this prescription was added. Make sure you understand how and when to take each.   vancomycin 125 MG capsule Commonly known as: Vancocin HCl Take 1 capsule (125 mg total) by mouth every 3 (three) days for 9 days. Start taking on: October 03, 2018 What changed:   when to take this  These instructions start on October 03, 2018. If you are unsure what to do until then, ask your doctor or other care provider.      Allergies  Allergen Reactions   Compazine [Prochlorperazine Edisylate] Other (See Comments)    REACTION: " tongue swell and unable to swallow"   Sulfonamide Derivatives Other (See Comments)     REACTION: " broke out with fine itching bumps"    The results of significant diagnostics from this hospitalization (including imaging, microbiology, ancillary and laboratory) are listed below for reference.    Significant  Diagnostic Studies: Ct Abdomen Pelvis Wo Contrast  Result Date: 08/27/2018 CLINICAL DATA:  Abdominal pain, distention, constipation and diarrhea EXAM: CT ABDOMEN AND PELVIS WITHOUT CONTRAST TECHNIQUE: Multidetector CT imaging of the abdomen and pelvis was performed following the standard protocol without IV contrast. COMPARISON:  07/24/2018 FINDINGS: Lower chest: No acute abnormality. Hepatobiliary: No solid liver abnormality is seen. No gallstones, gallbladder wall thickening, or biliary dilatation. Pancreas: Unremarkable. No pancreatic ductal dilatation or surrounding inflammatory changes. Spleen: Normal in size without significant abnormality. Adrenals/Urinary Tract: Adrenal glands are unremarkable. Kidneys are normal, without renal calculi, solid lesion, or hydronephrosis. Bladder is unremarkable. Stomach/Bowel: Stomach is within normal limits. Appendix appears normal. The colon is diffusely thickened with surrounding inflammatory fat stranding. Sigmoid diverticulosis. Vascular/Lymphatic: Aortic atherosclerosis. No enlarged abdominal or pelvic lymph nodes. Reproductive: No mass or other significant abnormality. Other: No abdominal wall hernia or abnormality. Small volume ascites. Musculoskeletal: No acute or significant osseous findings. IMPRESSION: 1. The colon is diffusely thickened with surrounding inflammatory fat stranding. Findings are worsened in comparison to prior examination dated 07/24/2018 and remain consistent with nonspecific infectious, inflammatory, or ischemic colitis. 2. Small volume ascites, likely reactive, and new compared to prior examination. 3.  Aortic atherosclerosis. Electronically Signed   By: Eddie Candle M.D.   On: 08/27/2018 10:25   Dg Chest 2  View  Result Date: 08/11/2018 CLINICAL DATA:  Weakness EXAM: CHEST - 2 VIEW COMPARISON:  08/03/2018 FINDINGS: Cardiac shadow is stable. Aortic calcifications are noted. The right lung is clear. Small left pleural effusion is noted similar to that seen on the prior exam. No acute bony abnormality is noted. IMPRESSION: Small left pleural effusion stable from the prior exam. Electronically Signed   By: Inez Catalina M.D.   On: 08/11/2018 14:20   Dg Lumbar Spine 2-3 Views  Result Date: 08/07/2018 CLINICAL DATA:  Pain EXAM: LUMBAR SPINE - 2-3 VIEW COMPARISON:  CT abdomen and pelvis 07/24/2018 FINDINGS: Osseous demineralization. Five non-rib-bearing lumbar vertebra. Multilevel disc space narrowing and endplate spur formation, greatest at L3-L4 and T11-T12. Facet degenerative changes of the lower lumbar spine. Grade 1 anterolisthesis at L4-L5 likely due to degenerative disc and facet disease. Vertebral body heights maintained without fracture, additional subluxation, or bone destruction. No spondylolysis. Atherosclerotic calcifications aorta. SI joints preserved. IMPRESSION: Degenerative disc and facet disease changes of the lumbar spine as above. Electronically Signed   By: Lavonia Dana M.D.   On: 08/07/2018 14:56   Dg Chest Portable 1 View  Result Date: 09/01/2018 CLINICAL DATA:  85 year old female with cough for a few days and chest discomfort. EXAM: PORTABLE CHEST 1 VIEW COMPARISON:  08/11/2018 and earlier. FINDINGS: Portable AP semi upright view at 1655 hours. Stable small left pleural effusion. Minimal if any blunting of the right lung base is stable. Stable cardiac size and mediastinal contours. Visualized tracheal air column is within normal limits. No pneumothorax, pulmonary edema or other confluent pulmonary opacity. Negative visible bowel gas pattern. No acute osseous abnormality identified. IMPRESSION: Stable small left pleural effusion. No new cardiopulmonary abnormality. Electronically Signed   By: Genevie Ann M.D.   On: 09/01/2018 17:16    Microbiology: Recent Results (from the past 240 hour(s))  SARS Coronavirus 2 (CEPHEID - Performed in [place] hospital lab), Hosp Order     Status: None   Collection Time: 08/27/18  9:28 AM   Specimen: Nasopharyngeal Swab  Result Value Ref Range Status   SARS Coronavirus 2 NEGATIVE NEGATIVE Final    Comment: (NOTE) If  result is NEGATIVE SARS-CoV-2 target nucleic acids are NOT DETECTED. The SARS-CoV-2 RNA is generally detectable in upper and lower  respiratory specimens during the acute phase of infection. The lowest  concentration of SARS-CoV-2 viral copies this assay can detect is 250  copies / mL. A negative result does not preclude SARS-CoV-2 infection  and should not be used as the sole basis for treatment or other  patient management decisions.  A negative result may occur with  improper specimen collection / handling, submission of specimen other  than nasopharyngeal swab, presence of viral mutation(s) within the  areas targeted by this assay, and inadequate number of viral copies  (<250 copies / mL). A negative result must be combined with clinical  observations, patient history, and epidemiological information. If result is POSITIVE SARS-CoV-2 target nucleic acids are DETECTED. The SARS-CoV-2 RNA is generally detectable in upper and lower  respiratory specimens dur ing the acute phase of infection.  Positive  results are indicative of active infection with SARS-CoV-2.  Clinical  correlation with patient history and other diagnostic information is  necessary to determine patient infection status.  Positive results do  not rule out bacterial infection or co-infection with other viruses. If result is PRESUMPTIVE POSTIVE SARS-CoV-2 nucleic acids MAY BE PRESENT.   A presumptive positive result was obtained on the submitted specimen  and confirmed on repeat testing.  While 2019 novel coronavirus  (SARS-CoV-2) nucleic acids may be present  in the submitted sample  additional confirmatory testing may be necessary for epidemiological  and / or clinical management purposes  to differentiate between  SARS-CoV-2 and other Sarbecovirus currently known to infect humans.  If clinically indicated additional testing with an alternate test  methodology [phone]) is advised. The SARS-CoV-2 RNA is generally  detectable in upper and lower respiratory sp ecimens during the acute  phase of infection. The expected result is Negative. Fact Sheet for Patients:  StrictlyIdeas.no Fact Sheet for Healthcare Providers: BankingDealers.co.za This test is not yet approved or cleared by the [place] FDA and has been authorized for detection and/or diagnosis of SARS-CoV-2 by FDA under an Emergency Use Authorization (EUA).  This EUA will remain in effect (meaning this test can be used) for the duration of the COVID-19 declaration under Section 564(b)(1) of the Act, 21 U.S.C. section 360bbb-3(b)(1), unless the authorization is terminated or revoked sooner. Performed at [facility], [place] [address], [place], [place] [address]   Gastrointestinal Panel by PCR , Stool     Status: None   Collection Time: 08/27/18 12:00 PM   Specimen: Stool  Result Value Ref Range Status   Campylobacter species NOT DETECTED NOT DETECTED Final   Plesimonas shigelloides NOT DETECTED NOT DETECTED Final   Salmonella species NOT DETECTED NOT DETECTED Final   Yersinia enterocolitica NOT DETECTED NOT DETECTED Final   Vibrio species NOT DETECTED NOT DETECTED Final   Vibrio cholerae NOT DETECTED NOT DETECTED Final   Enteroaggregative E coli (EAEC) NOT DETECTED NOT DETECTED Final   Enteropathogenic E coli (EPEC) NOT DETECTED NOT DETECTED Final   Enterotoxigenic E coli (ETEC) NOT DETECTED NOT DETECTED Final   Shiga like toxin producing E coli (STEC) NOT DETECTED NOT DETECTED Final   E. coli O157 NOT DETECTED NOT DETECTED  Final   Shigella/Enteroinvasive E coli (EIEC) NOT DETECTED NOT DETECTED Final   Cryptosporidium NOT DETECTED NOT DETECTED Final   Cyclospora cayetanensis NOT DETECTED NOT DETECTED Final   Entamoeba histolytica NOT DETECTED NOT DETECTED Final  Giardia lamblia NOT DETECTED NOT DETECTED Final   Adenovirus F40/41 NOT DETECTED NOT DETECTED Final   Astrovirus NOT DETECTED NOT DETECTED Final   Norovirus GI/GII NOT DETECTED NOT DETECTED Final   Rotavirus A NOT DETECTED NOT DETECTED Final   Sapovirus (I, II, IV, and V) NOT DETECTED NOT DETECTED Final    Comment: Performed at [facility], [address] [place], [place] [address]  C Difficile Quick Screen w PCR reflex     Status: Abnormal   Collection Time: 08/27/18 12:00 PM   Specimen: Stool  Result Value Ref Range Status   C Diff antigen POSITIVE (A) NEGATIVE Final   C Diff toxin POSITIVE (A) NEGATIVE Final   C Diff interpretation Toxin producing C. difficile detected.  Final    Comment: CRITICAL RESULT CALLED TO, READ BACK BY AND VERIFIED WITH: Johnette Abraham Preyer RN 14:35 08/27/18 ([email or web address]) Performed at [facility], [place] [address], [place], [place] [address]   Blood Culture (routine x 2)     Status: None   Collection Time: 08/27/18  2:35 PM   Specimen: BLOOD RIGHT WRIST  Result Value Ref Range Status   Specimen Description BLOOD RIGHT WRIST  Final   Special Requests   Final    BOTTLES DRAWN AEROBIC ONLY Blood Culture results may not be optimal due to an inadequate volume of blood received in culture bottles   Culture   Final    NO GROWTH 5 DAYS Performed at [facility], [place] [address], [place], [place] [address]    Report Status 09/01/2018 FINAL  Final  Blood Culture (routine x 2)     Status: None   Collection Time: 08/27/18  2:51 PM   Specimen: BLOOD LEFT FOREARM  Result Value Ref Range Status   Specimen Description BLOOD LEFT FOREARM  Final   Special Requests   Final    BOTTLES DRAWN AEROBIC ONLY Blood Culture  adequate volume   Culture   Final    NO GROWTH 5 DAYS Performed at [facility], [place] [address], [place], [place] [address]    Report Status 09/01/2018 FINAL  Final     Labs: Basic Metabolic Panel: Recent Labs  Lab 08/29/18 1524 08/30/18 0242 08/31/18 1138 09/01/18 0337 09/02/18 0455  NA 145 145 144 143 142  K 3.7 3.5 3.1* 3.4* 3.4*  CL 118* 120* 118* 116* 116*  CO2 17* 15* 19* 19* 19*  GLUCOSE 163* 174* 158* 160* 289*  BUN 26* 25* 20 15 23   CREATININE 1.33* 0.99 0.98 0.90 1.00  CALCIUM 6.8* 7.0* 6.9* 6.9* 7.4*   Liver Function Tests: Recent Labs  Lab 08/29/18 1524 08/30/18 0242 08/31/18 1138 09/01/18 0337 09/02/18 0455  AST 18 12* 13* 12* 12*  ALT 11 11 8 10 9   ALKPHOS 65 54 49 41 41  BILITOT 0.2* 0.5 0.4 0.2* 0.2*  PROT 4.6* 4.4* 4.2* 3.8* 3.9*  ALBUMIN 2.0* 1.9* 1.9* 1.8* 1.8*   No results for input(s): LIPASE, AMYLASE in the last 168 hours. No results for input(s): AMMONIA in the last 168 hours. CBC: Recent Labs  Lab 08/28/18 0345 09/01/18 0337  WBC 13.7* 8.2  NEUTROABS  --  6.7  HGB 9.1* 7.9*  HCT 29.5* 25.2*  MCV 93.4 91.3  PLT 357 281   Cardiac Enzymes: No results for input(s): CKTOTAL, CKMB, CKMBINDEX, TROPONINI in the last 168 hours. BNP: BNP (last 3 results) No results for input(s): BNP in the last 8760 hours.  ProBNP (  last 3 results) No results for input(s): PROBNP in the last 8760 hours.  CBG: Recent Labs  Lab 09/02/18 0859 09/02/18 1304 09/02/18 1733 09/02/18 2235 09/03/18 0910  GLUCAP 238* 244* 132* 130* 96    Principal Problem:   C. difficile colitis Active Problems:   Rheumatoid arthritis (HCC)   Mixed hyperlipidemia   AKI (acute kidney injury) ([facility])   Diabetes mellitus type 2 in nonobese (HCC)   PAF (paroxysmal atrial fibrillation) (HCC)   Hypoalbuminemia due to protein-calorie malnutrition (HCC)   Essential hypertension   Pressure injury of skin   Time coordinating discharge: 38 minutes  Signed:         Krystyna Cleckley, DO [facility]  09/03/2018, 7:21 PM

## 2018-09-03 NOTE — Telephone Encounter (Signed)
Covid-19 screening questions   Do you now or have you had a fever in the last 14 days? no  Do you have any respiratory symptoms of shortness of breath or cough now or in the last 14 days? no  Do you have any family members or close contacts with diagnosed or suspected Covid-19 in the past 14 days? no  Have you been tested for Covid-19 and found to be positive? No   Patient and healthcare partner were told to wear a mask to the office. Patient verbalized understanding.
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
## 2018-09-03 NOTE — Progress Notes (Signed)
Nsg Discharge Note  Admit Date:  08/27/2018 Discharge date: 09/03/2018   Candelaria Stagers Strother to be D/C'd Home per MD order.  AVS completed.    Discharge Medication: Allergies as of 09/03/2018      Reactions   Compazine [prochlorperazine Edisylate] Other (See Comments)   REACTION: " tongue swell and unable to swallow"   Sulfonamide Derivatives Other (See Comments)   REACTION: " broke out with fine itching bumps"      Medication List    STOP taking these medications   Enbrel 50 MG/ML injection Generic drug: etanercept     TAKE these medications   acetaminophen 325 MG tablet Commonly known as: TYLENOL Take 2 tablets (650 mg total) by mouth every 6 (six) hours as needed for mild pain (or Fever >/= 101).   apixaban 2.5 MG Tabs tablet Commonly known as: ELIQUIS Take 1 tablet (2.5 mg total) by mouth 2 (two) times daily for 30 days.   atorvastatin 20 MG tablet Commonly known as: LIPITOR Take 1 tablet (20 mg total) by mouth daily. What changed: when to take this   Caltrate 600+D 600-400 MG-UNIT tablet Generic drug: Calcium Carbonate-Vitamin D Take 1 tablet by mouth daily at 12 noon.   carvedilol 3.125 MG tablet Commonly known as: COREG Take 1 tablet (3.125 mg total) by mouth 2 (two) times daily with a meal.   CENTRUM SILVER 50+WOMEN PO Take 1 tablet by mouth daily.   Cholecalciferol 25 MCG (1000 UT) capsule Take 1 capsule (1,000 Units total) by mouth daily.   dextromethorphan-guaiFENesin 30-600 MG 12hr tablet Commonly known as: MUCINEX DM Take 1 tablet by mouth 2 (two) times daily as needed for cough.   dicyclomine 10 MG capsule Commonly known as: BENTYL Take 1 capsule (10 mg total) by mouth 4 (four) times daily -  before meals and at bedtime.   esomeprazole 40 MG capsule Commonly known as: NEXIUM Take 1 capsule (40 mg total) by mouth daily at 12 noon. What changed: when to take this   feeding supplement (ENSURE ENLIVE) Liqd Take 237 mLs by mouth 2 (two) times daily between  meals.   Fish Oil 1200 MG Caps Take 1 capsule by mouth daily at 12 noon.   insulin NPH Human 100 UNIT/ML injection Commonly known as: NOVOLIN N Inject 0.05 mLs (5 Units total) into the skin 2 (two) times daily before a meal.   megestrol 40 MG tablet Commonly known as: MEGACE Take 1 tablet (40 mg total) by mouth daily.   nitroGLYCERIN 0.4 MG SL tablet Commonly known as: NITROSTAT 1 TABLET UNDER TONGUE AT ONSET OF CHEST PAIN YOU MAY REPEAT EVERY 5 MINUTES FOR UP TO 3 DOSES. What changed: See the new instructions.   ondansetron 4 MG tablet Commonly known as: Zofran Take 1 tablet (4 mg total) by mouth daily as needed for nausea or vomiting.   oxyCODONE 5 MG immediate release tablet Commonly known as: Oxy IR/ROXICODONE Take 1 tablet (5 mg total) by mouth every 6 (six) hours as needed for moderate pain.   predniSONE 10 MG tablet Commonly known as: DELTASONE Take 10 mg by mouth daily.   PROBIOTIC-10 PO Take 1 capsule by mouth daily.   vancomycin 125 MG capsule Commonly known as: Vancocin HCl Take 1 capsule (125 mg total) by mouth 4 (four) times daily for 12 days, THEN 1 capsule (125 mg total) 2 (two) times a day for 7 days. Start taking on: September 02, 2018 What changed: You were already taking a medication  with the same name, and this prescription was added. Make sure you understand how and when to take each.   vancomycin 125 MG capsule Commonly known as: Vancocin HCl Take 1 capsule (125 mg total) by mouth daily for 7 days, THEN 1 capsule (125 mg total) every other day for 8 days. Start taking on: September 18, 2018 What changed: You were already taking a medication with the same name, and this prescription was added. Make sure you understand how and when to take each.   vancomycin 125 MG capsule Commonly known as: Vancocin HCl Take 1 capsule (125 mg total) by mouth every 3 (three) days for 9 days. Start taking on: October 03, 2018 What changed:   when to take this  These  instructions start on October 03, 2018. If you are unsure what to do until then, ask your doctor or other care provider.            Durable Medical Equipment  (From admission, onward)         Start     Ordered   09/02/18 1606  DME 3-in-1  Once     09/02/18 1613          Discharge Assessment: Vitals:   09/03/18 0449 09/03/18 0912  BP: (!) 170/64 (!) 144/60  Pulse: 62 81  Resp: 16   Temp: 99 F (37.2 C)   SpO2: 100%    Skin clean, dry and intact without evidence of skin break down, no evidence of skin tears noted. IV catheter discontinued intact. Site without signs and symptoms of complications - no redness or edema noted at insertion site, patient denies c/o pain - only slight tenderness at site.  Dressing with slight pressure applied.  D/c Instructions-Education: Discharge instructions given to patient/family with verbalized understanding. D/c education completed with patient/family including follow up instructions, medication list, d/c activities limitations if indicated, with other d/c instructions as indicated by MD - patient able to verbalize understanding, all questions fully answered. Patient instructed to return to ED, call 911, or call MD for any changes in condition.  Patient escorted via [place], and D/C home via private auto.  Hiram Comber, RN 09/03/2018 9:38 AM

## 2018-09-04 ENCOUNTER — Encounter

## 2018-09-04 ENCOUNTER — Ambulatory Visit (INDEPENDENT_AMBULATORY_CARE_PROVIDER_SITE_OTHER): Payer: Medicare Other | Admitting: Nurse Practitioner

## 2018-09-04 ENCOUNTER — Encounter: Payer: Self-pay | Admitting: Nurse Practitioner

## 2018-09-04 VITALS — BP 140/64 | HR 78 | Temp 97.4°F | Ht 61.0 in | Wt 148.5 lb

## 2018-09-04 DIAGNOSIS — A0471 Enterocolitis due to Clostridium difficile, recurrent: Secondary | ICD-10-CM | POA: Diagnosis not present

## 2018-09-04 DIAGNOSIS — E1122 Type 2 diabetes mellitus with diabetic chronic kidney disease: Secondary | ICD-10-CM | POA: Diagnosis not present

## 2018-09-04 DIAGNOSIS — E1151 Type 2 diabetes mellitus with diabetic peripheral angiopathy without gangrene: Secondary | ICD-10-CM | POA: Diagnosis not present

## 2018-09-04 DIAGNOSIS — N183 Chronic kidney disease, stage 3 (moderate): Secondary | ICD-10-CM | POA: Diagnosis not present

## 2018-09-04 DIAGNOSIS — A0472 Enterocolitis due to Clostridium difficile, not specified as recurrent: Secondary | ICD-10-CM

## 2018-09-04 DIAGNOSIS — I480 Paroxysmal atrial fibrillation: Secondary | ICD-10-CM | POA: Diagnosis not present

## 2018-09-04 DIAGNOSIS — M545 Low back pain: Secondary | ICD-10-CM | POA: Diagnosis not present

## 2018-09-04 DIAGNOSIS — M069 Rheumatoid arthritis, unspecified: Secondary | ICD-10-CM | POA: Diagnosis not present

## 2018-09-04 DIAGNOSIS — E46 Unspecified protein-calorie malnutrition: Secondary | ICD-10-CM | POA: Diagnosis not present

## 2018-09-04 DIAGNOSIS — I129 Hypertensive chronic kidney disease with stage 1 through stage 4 chronic kidney disease, or unspecified chronic kidney disease: Secondary | ICD-10-CM | POA: Diagnosis not present

## 2018-09-04 DIAGNOSIS — K5732 Diverticulitis of large intestine without perforation or abscess without bleeding: Secondary | ICD-10-CM

## 2018-09-04 DIAGNOSIS — M810 Age-related osteoporosis without current pathological fracture: Secondary | ICD-10-CM | POA: Diagnosis not present

## 2018-09-04 DIAGNOSIS — K5790 Diverticulosis of intestine, part unspecified, without perforation or abscess without bleeding: Secondary | ICD-10-CM | POA: Diagnosis not present

## 2018-09-04 DIAGNOSIS — I4891 Unspecified atrial fibrillation: Secondary | ICD-10-CM | POA: Diagnosis not present

## 2018-09-04 DIAGNOSIS — L89321 Pressure ulcer of left buttock, stage 1: Secondary | ICD-10-CM | POA: Diagnosis not present

## 2018-09-04 DIAGNOSIS — N189 Chronic kidney disease, unspecified: Secondary | ICD-10-CM | POA: Diagnosis not present

## 2018-09-04 MED ORDER — POTASSIUM CHLORIDE CRYS ER 20 MEQ PO TBCR: 20.0000 meq | EXTENDED_RELEASE_TABLET | ORAL | 0 refills | Status: DC

## 2018-09-04 MED ORDER — FUROSEMIDE 20 MG PO TABS: 20.0000 mg | ORAL_TABLET | ORAL | 0 refills | Status: DC

## 2018-09-04 NOTE — Patient Instructions (Addendum)
If you are age 83 or older, your body mass index should be between 23-30. Your Body mass index is 28.06 kg/m. If this is out of the aforementioned range listed, please consider follow up with your Primary Care Provider.  If you are age 4 or younger, your body mass index should be between 19-25. Your Body mass index is 28.06 kg/m. If this is out of the aformentioned range listed, please consider follow up with your Primary Care Provider.   We have sent the following medications to your pharmacy for you to pick up at your convenience: Lasix 20 mg Potassium Chloride 20 meq  Start Florastor one twice daily for 2 months.  (this is over-the-counter).  Complete Vancomycin.  Call your primary care physician about edema (fluid).  Follow up with me on 10/01/18 at 2 pm.  Thank you for choosing me and [facility].   Tye Savoy, NP

## 2018-09-08 ENCOUNTER — Encounter: Payer: Self-pay | Admitting: Nurse Practitioner

## 2018-09-08 ENCOUNTER — Other Ambulatory Visit: Payer: Self-pay

## 2018-09-08 NOTE — Patient Outreach (Signed)
[facility]) Care Management  09/08/2018  Shelby Walker 29-Dec-1933 [id number]    EMMI-General Discharge RED ON EMMI ALERT Day # 1 Date: 09/04/2018 Red Alert Reason: " Know who to call about changes in condition? No"   Outreach attempt #1 to patient. Spoke with both patient and her caregiver/daughter. Patient states that she has been having some abd cramping. Daughter voices that it is not new and what patient was admitted to hospital for. She reports she has meds in the home to give to patient and plans to do so shortly after she eats breakfast. Reviewed and addressed red alert with them. Daughter reports she answered automated call and thought it was referring to who to contact at [facility]. Advised daughter question was in regards to knowing which MDs to call and when. Daughter confirms that she has PCP and GI MD contact info. Patient had GI appt follow up last week. She has appt with PCP on tomorrow via tele health. Daughter voices that patient has all her meds in the home. She reports some frustrations as patient was given a med to take that costs about $100. However, patient not taking med as is makes her sick to her stomach. Daughter voices meds were filled at hospital pharmacy prior to discharge. Advised daughter to discuss and have meds reviewed with PCP during appt tomorrow and to alert them of SEs from med pt. Experiencing to see if there is an alternate med to take. Daughter voiced understanding. She denies any further RN CM needs or concerns at this time.     Plan: RN CM will close case as no further interventions needed at this time.  Enzo Montgomery, RN,BSN,CCM [facility] Telephonic Care Management Coordinator Direct Phone: [phone] Toll Free: [phone] Fax: [phone]

## 2018-09-08 NOTE — Progress Notes (Addendum)
ASSESSMENT / PLAN:   35. 85 yo female with multiple medical problems recently hospitalized twice with c-diff colitis ( required ICU the first time in early June). C-diff followed course of antibiotics prescribed at time of ED visit early May for possible diverticulitis. She is doing better on Vancomycin taper prescribed by ID.  -Left sided colon wall thickening on initial CT scan early May may have presented diverticulitis but neoplasm is always on list of DDx until proven otherwise. However, given age and patient's co-morbidities I don't think colonoscopy is warranted nor safe. -Complete Vancomycin taper  -I would like to see her back in a few weeks to ensure resolution of symptoms. Family requesting to be under care of Dr. Carlean Purl.  -In the interim I recommended Florastor probiotics.   2. Bilateral lower extremity edema. Legs are weeping. Patient has a call placed to PCP but doubtful will get seen this afternoon and tomorrow starts holiday weekend. They are asking for help.  -low sodium diet.  -I'm comfortable given her a low dose of lasix over the weekend until they can reach PCP. Her K+ was low at 3.4 at last check so will give K+ 20 mEq with the 20 mg of lasix.   Agree with Ms. Chalsey Leeth's assessment and plan. Gatha Mayer, MD, [facility]   HPI:    Chief Complaint:   C-diff and diverticulitis.   Patient is an 85 yo female with RA , HTN, CKD3, DM and recent onset of AFIB with RVR (now on Eliquis). Referred by PCP, Dr. Forde Dandy for evaluation of diverticulitis. Patient went to ED early May with abdoinal pain. CTAP showed large stool burden and ? Early diverticulitis. Prescribed course of antibiotics. Patient later developed crampy diarrhea and in late May was admitted with SIRS / C-diff and AKI.  Inpatient CTAP revealed diffuse colitis. Carney Bern for RA was held. C-diff treated with oral Vancomycin, she was discharged to CIR on 08/06/18 where she stayed until 6/15.  At some point  following discharge Rheumatology increased prednisone dosage to 10 mg daily. Repeat C-diff was negative on 6/15.   Unfortunately patient required readmission on 08/27/18 for recurrent crampy diarrhea. Repeat C-diff was positive.Reat CTAP wo contrast revealed diffusely thickened colon with surrounding inflammatory fat stranding. Findings worsened in comparison to prior examination dated 07/24/2018 and remain consistent with nonspecific infectious, inflammatory, or ischemic colitis. ID evaluated and started Vancomycin taper which she is still taking. Diarrhea / cramps have significantly improved. Patient has no remaining GI complaints. She had no previous episodes of diverticulitis prior to May. Sounds like she has never had a colonoscopy.   Patient and her family are very concerned about the amount of bilateral lower extremity swelling since being hospitalized. Never had fluid accumulation before. They left a message for and trying to get in to see PCP.    Data Reviewed:   09/01/18 Hgb 7.9  ( baseline ~ 11 )  MCV 91 K+ 3.4 Cr 0.9 Liver tests normal.    Past Medical History:  Diagnosis Date   Bruit    Carotid Doppler showed no significant abnormality     9per patient)   C. difficile colitis 07/2018   with severe sepsis   Chest pain, unspecified    Nuclear, May, 2008, no scar or ischemia   Diabetes mellitus    Diverticulosis    Dyslipidemia    Ejection fraction    EF  55-60%, echo, February, 2011   GERD (gastroesophageal reflux disease)    Mitral regurgitation April 21, 2009   mild,  echo, February, 2011   Osteoporosis    Palpitations    possible very brief atrial fibrillation on monitor and possible reentrant tachycardia   Psoriasis    Rheumatoid arthritis ([place])      Past Surgical History:  Procedure Laterality Date   FRACTURE SURGERY     Family History  Problem Relation Age of Onset   Heart failure Father    Heart attack Father    Diabetes Father      Heart attack Mother    Heart failure Mother    Diabetes Mother    Stroke Sister    Social History   Tobacco Use   Smoking status: Never Smoker   Smokeless tobacco: Never Used  Substance Use Topics   Alcohol use: No    Alcohol/week: 0.0 standard drinks   Drug use: No   Current Outpatient Medications  Medication Sig Dispense Refill   acetaminophen (TYLENOL) 325 MG tablet Take 2 tablets (650 mg total) by mouth every 6 (six) hours as needed for mild pain (or Fever >/= 101).     apixaban (ELIQUIS) 2.5 MG TABS tablet Take 1 tablet (2.5 mg total) by mouth 2 (two) times daily for 30 days. 60 tablet 0   atorvastatin (LIPITOR) 20 MG tablet Take 1 tablet (20 mg total) by mouth daily. (Patient taking differently: Take 20 mg by mouth at bedtime. ) 90 tablet 3   Calcium Carbonate-Vitamin D (CALTRATE 600+D) 600-400 MG-UNIT per tablet Take 1 tablet by mouth daily at 12 noon.      carvedilol (COREG) 3.125 MG tablet Take 1 tablet (3.125 mg total) by mouth 2 (two) times daily with a meal. 60 tablet 0   Cholecalciferol 25 MCG (1000 UT) capsule Take 1 capsule (1,000 Units total) by mouth daily. 30 capsule 0   dextromethorphan-guaiFENesin (MUCINEX DM) 30-600 MG 12hr tablet Take 1 tablet by mouth 2 (two) times daily as needed for cough. 20 tablet 0   dicyclomine (BENTYL) 10 MG capsule Take 1 capsule (10 mg total) by mouth 4 (four) times daily -  before meals and at bedtime. (Patient taking differently: Take 10 mg by mouth 3 (three) times daily as needed. ) 30 capsule 0   esomeprazole (NEXIUM) 40 MG capsule Take 1 capsule (40 mg total) by mouth daily at 12 noon. (Patient taking differently: Take 40 mg by mouth daily. ) 30 capsule 0   feeding supplement, ENSURE ENLIVE, (ENSURE ENLIVE) LIQD Take 237 mLs by mouth 2 (two) times daily between meals. 237 mL 12   insulin NPH Human (NOVOLIN N) 100 UNIT/ML injection Inject 0.05 mLs (5 Units total) into the skin 2 (two) times daily before a meal. 10  mL 11   megestrol (MEGACE) 40 MG tablet Take 1 tablet (40 mg total) by mouth daily. 30 tablet 0   Multiple Vitamins-Minerals (CENTRUM SILVER 50+WOMEN PO) Take 1 tablet by mouth daily. 20 mg tablet     nitroGLYCERIN (NITROSTAT) 0.4 MG SL tablet 1 TABLET UNDER TONGUE AT ONSET OF CHEST PAIN YOU MAY REPEAT EVERY 5 MINUTES FOR UP TO 3 DOSES. (Patient taking differently: Place 0.4 mg under the tongue See admin instructions. 1 TABLET UNDER TONGUE AT ONSET OF CHEST PAIN YOU MAY REPEAT EVERY 5 MINUTES FOR UP TO 3 DOSES.) 25 tablet 3   Omega-3 Fatty Acids (FISH OIL) 1200 MG CAPS Take 1 capsule by mouth daily  at 12 noon.     oxyCODONE (OXY IR/ROXICODONE) 5 MG immediate release tablet Take 1 tablet (5 mg total) by mouth every 6 (six) hours as needed for moderate pain. 20 tablet 0   predniSONE (DELTASONE) 10 MG tablet Take 10 mg by mouth daily.   3   [START ON 10/03/2018] vancomycin (VANCOCIN HCL) 125 MG capsule Take 1 capsule (125 mg total) by mouth every 3 (three) days for 9 days. 3 capsule 0   Etanercept (ENBREL [place]) Inject 50 mg into the skin once a week.     furosemide (LASIX) 20 MG tablet Take 1 tablet (20 mg total) by mouth every morning. 10 tablet 0   ondansetron (ZOFRAN) 4 MG tablet Take 1 tablet (4 mg total) by mouth daily as needed for nausea or vomiting. 30 tablet 1   potassium chloride SA (K-DUR) 20 MEQ tablet Take 1 tablet (20 mEq total) by mouth every morning. Take with Furosemide every morning. 10 tablet 0   Probiotic Product (PROBIOTIC-10 PO) Take 1 capsule by mouth daily.     [START ON 09/18/2018] vancomycin (VANCOCIN HCL) 125 MG capsule Take 1 capsule (125 mg total) by mouth daily for 7 days, THEN 1 capsule (125 mg total) every other day for 8 days. 11 capsule 0   vancomycin (VANCOCIN HCL) 125 MG capsule Take 1 capsule (125 mg total) by mouth 4 (four) times daily for 12 days, THEN 1 capsule (125 mg total) 2 (two) times a day for 7 days. 62 capsule 0   No current facility-administered  medications for this visit.    Allergies  Allergen Reactions   Compazine [Prochlorperazine Edisylate] Other (See Comments)    REACTION: " tongue swell and unable to swallow"   Sulfonamide Derivatives Other (See Comments)    REACTION: " broke out with fine itching bumps"   Cholestyramine     Possible- Makes throat burn. Patient said she can't take it      Review of Systems: Positive for arthritis, back pain, fatigue, swelling legs. All other systems reviewed and negative except where noted in HPI.   Serum creatinine: 1 mg/dL 09/02/18 0455 Estimated creatinine clearance: 37.4 mL/min   Physical Exam:    Wt Readings from Last 3 Encounters:  09/04/18 148 lb 8 oz (67.4 kg)  09/03/18 115 lb 15.4 oz (52.6 kg)  08/23/18 123 lb 10.9 oz (56.1 kg)    BP 140/64    Pulse 78    Temp (!) 97.4 F (36.3 C)    Ht 5\' 1"  (1.549 m)    Wt 148 lb 8 oz (67.4 kg)    BMI 28.06 kg/m  Constitutional:  Pleasant female in no acute distress in wheelchair Psychiatric: Normal mood and affect. Behavior is normal. EENT: Pupils normal.  Conjunctivae are normal. No scleral icterus. HOH Neck supple.  Cardiovascular: Normal rate, regular rhythm. Bilateral lower extremity edema. Wearing compression hose, + weeping.  Pulmonary/chest: Effort normal. A few bibasilar crackles. . Abdominal: Soft, nondistended, nontender. Bowel sounds otherwise limited exam in wheelchair.  Neurological: Alert and oriented to person place and time. Skin: Skin is warm and dry. No rashes noted.  Tye Savoy, NP  09/08/2018, 12:12 AM  Cc: Reynold Bowen, MD

## 2018-09-09 DIAGNOSIS — N189 Chronic kidney disease, unspecified: Secondary | ICD-10-CM | POA: Diagnosis not present

## 2018-09-09 DIAGNOSIS — R5381 Other malaise: Secondary | ICD-10-CM | POA: Diagnosis not present

## 2018-09-09 DIAGNOSIS — N183 Chronic kidney disease, stage 3 (moderate): Secondary | ICD-10-CM | POA: Diagnosis not present

## 2018-09-09 DIAGNOSIS — A0472 Enterocolitis due to Clostridium difficile, not specified as recurrent: Secondary | ICD-10-CM | POA: Diagnosis not present

## 2018-09-09 DIAGNOSIS — I4891 Unspecified atrial fibrillation: Secondary | ICD-10-CM | POA: Diagnosis not present

## 2018-09-09 DIAGNOSIS — E8770 Fluid overload, unspecified: Secondary | ICD-10-CM | POA: Diagnosis not present

## 2018-09-09 DIAGNOSIS — E872 Acidosis: Secondary | ICD-10-CM | POA: Diagnosis not present

## 2018-09-09 DIAGNOSIS — R627 Adult failure to thrive: Secondary | ICD-10-CM | POA: Diagnosis not present

## 2018-09-09 DIAGNOSIS — E1151 Type 2 diabetes mellitus with diabetic peripheral angiopathy without gangrene: Secondary | ICD-10-CM | POA: Diagnosis not present

## 2018-09-09 DIAGNOSIS — E46 Unspecified protein-calorie malnutrition: Secondary | ICD-10-CM | POA: Diagnosis not present

## 2018-09-09 DIAGNOSIS — E1122 Type 2 diabetes mellitus with diabetic chronic kidney disease: Secondary | ICD-10-CM | POA: Diagnosis not present

## 2018-09-09 DIAGNOSIS — E8809 Other disorders of plasma-protein metabolism, not elsewhere classified: Secondary | ICD-10-CM | POA: Diagnosis not present

## 2018-09-09 DIAGNOSIS — E1129 Type 2 diabetes mellitus with other diabetic kidney complication: Secondary | ICD-10-CM | POA: Diagnosis not present

## 2018-09-09 DIAGNOSIS — I129 Hypertensive chronic kidney disease with stage 1 through stage 4 chronic kidney disease, or unspecified chronic kidney disease: Secondary | ICD-10-CM | POA: Diagnosis not present

## 2018-09-09 DIAGNOSIS — E119 Type 2 diabetes mellitus without complications: Secondary | ICD-10-CM | POA: Diagnosis not present

## 2018-09-10 DIAGNOSIS — I4891 Unspecified atrial fibrillation: Secondary | ICD-10-CM | POA: Diagnosis not present

## 2018-09-10 DIAGNOSIS — N189 Chronic kidney disease, unspecified: Secondary | ICD-10-CM | POA: Diagnosis not present

## 2018-09-10 DIAGNOSIS — I129 Hypertensive chronic kidney disease with stage 1 through stage 4 chronic kidney disease, or unspecified chronic kidney disease: Secondary | ICD-10-CM | POA: Diagnosis not present

## 2018-09-10 DIAGNOSIS — E1122 Type 2 diabetes mellitus with diabetic chronic kidney disease: Secondary | ICD-10-CM | POA: Diagnosis not present

## 2018-09-10 DIAGNOSIS — E46 Unspecified protein-calorie malnutrition: Secondary | ICD-10-CM | POA: Diagnosis not present

## 2018-09-10 DIAGNOSIS — E1151 Type 2 diabetes mellitus with diabetic peripheral angiopathy without gangrene: Secondary | ICD-10-CM | POA: Diagnosis not present

## 2018-09-11 ENCOUNTER — Ambulatory Visit: Payer: Medicare Other | Admitting: Internal Medicine

## 2018-09-12 ENCOUNTER — Encounter: Payer: Self-pay | Admitting: Cardiovascular Disease

## 2018-09-12 ENCOUNTER — Telehealth: Payer: Self-pay | Admitting: Cardiovascular Disease

## 2018-09-12 DIAGNOSIS — E1151 Type 2 diabetes mellitus with diabetic peripheral angiopathy without gangrene: Secondary | ICD-10-CM | POA: Diagnosis not present

## 2018-09-12 DIAGNOSIS — N189 Chronic kidney disease, unspecified: Secondary | ICD-10-CM | POA: Diagnosis not present

## 2018-09-12 DIAGNOSIS — E46 Unspecified protein-calorie malnutrition: Secondary | ICD-10-CM | POA: Diagnosis not present

## 2018-09-12 DIAGNOSIS — E1122 Type 2 diabetes mellitus with diabetic chronic kidney disease: Secondary | ICD-10-CM | POA: Diagnosis not present

## 2018-09-12 DIAGNOSIS — I4891 Unspecified atrial fibrillation: Secondary | ICD-10-CM | POA: Diagnosis not present

## 2018-09-12 DIAGNOSIS — I129 Hypertensive chronic kidney disease with stage 1 through stage 4 chronic kidney disease, or unspecified chronic kidney disease: Secondary | ICD-10-CM | POA: Diagnosis not present

## 2018-09-12 NOTE — Telephone Encounter (Signed)

## 2018-09-12 NOTE — Telephone Encounter (Signed)
New Message     Call was disconnected while reading the consent

## 2018-09-15 ENCOUNTER — Telehealth (INDEPENDENT_AMBULATORY_CARE_PROVIDER_SITE_OTHER): Payer: Medicare Other | Admitting: Cardiovascular Disease

## 2018-09-15 ENCOUNTER — Encounter: Payer: Self-pay | Admitting: Cardiovascular Disease

## 2018-09-15 ENCOUNTER — Other Ambulatory Visit: Payer: Self-pay

## 2018-09-15 VITALS — BP 129/62 | HR 80 | Temp 97.7°F | Ht 61.0 in | Wt 116.0 lb

## 2018-09-15 DIAGNOSIS — I480 Paroxysmal atrial fibrillation: Secondary | ICD-10-CM

## 2018-09-15 DIAGNOSIS — E46 Unspecified protein-calorie malnutrition: Secondary | ICD-10-CM | POA: Diagnosis not present

## 2018-09-15 DIAGNOSIS — N189 Chronic kidney disease, unspecified: Secondary | ICD-10-CM | POA: Diagnosis not present

## 2018-09-15 DIAGNOSIS — I4891 Unspecified atrial fibrillation: Secondary | ICD-10-CM | POA: Diagnosis not present

## 2018-09-15 DIAGNOSIS — I129 Hypertensive chronic kidney disease with stage 1 through stage 4 chronic kidney disease, or unspecified chronic kidney disease: Secondary | ICD-10-CM | POA: Diagnosis not present

## 2018-09-15 DIAGNOSIS — I6529 Occlusion and stenosis of unspecified carotid artery: Secondary | ICD-10-CM | POA: Diagnosis not present

## 2018-09-15 DIAGNOSIS — E1151 Type 2 diabetes mellitus with diabetic peripheral angiopathy without gangrene: Secondary | ICD-10-CM | POA: Diagnosis not present

## 2018-09-15 DIAGNOSIS — Z7189 Other specified counseling: Secondary | ICD-10-CM

## 2018-09-15 DIAGNOSIS — E1122 Type 2 diabetes mellitus with diabetic chronic kidney disease: Secondary | ICD-10-CM | POA: Diagnosis not present

## 2018-09-15 NOTE — Patient Instructions (Signed)
Medication Instructions:  Your physician recommends that you continue on your current medications as directed. Please refer to the Current Medication list given to you today.  If you need a refill on your cardiac medications before your next appointment, please call your pharmacy.    Lab work: None Ordered   Testing/Procedures: None Ordered   Follow-Up: At [facility], you and your health needs are our priority.  As part of our continuing mission to provide you with exceptional heart care, we have created designated Provider Care Teams.  These Care Teams include your primary Cardiologist (physician) and Advanced Practice Providers (APPs -  Physician Assistants and Nurse Practitioners) who all work together to provide you with the care you need, when you need it. You will need a follow up appointment in:  6 months.  Please call our office 2 months in advance to schedule this appointment.  You may see Philip Nahser, MD or one of the following Advanced Practice Providers on your designated Care Team: Scott Weaver, PA-C Vin Bhagat, PA-C . Janine Hammond, NP   

## 2018-09-15 NOTE — Progress Notes (Signed)
Virtual Visit via Video Note   This visit type was conducted due to national recommendations for restrictions regarding the COVID-19 Pandemic (e.g. social distancing) in an effort to limit this patient's exposure and mitigate transmission in our community.  Due to her co-morbid illnesses, this patient is at least at moderate risk for complications without adequate follow up.  This format is felt to be most appropriate for this patient at this time.  All issues noted in this document were discussed and addressed.  A limited physical exam was performed with this format.  Please refer to the patient's chart for her consent to telehealth for [facility].   Date:  09/15/2018   ID:  Shelby Walker, DOB 01-09-1934, MRN [id number]  Patient Location: Home Provider Location: Office  PCP:  Reynold Bowen, MD  Cardiologist:  Mertie Moores, MD  Electrophysiologist:  None   Evaluation Performed:  Follow-Up Visit  [name] is a 85 y.o. female who presents today to follow-up a history of mild chest discomfort and palpitations in the past. She is feeling well and not having any significant symptoms.  Recently she had a mild case of shingles. She also had an upper respiratory infection that is being treated and improving.  Dec 09, 2015:  Shelby Walker is seen today for the first time - transfer from [facility] Followed for palpitations with some associated chest pain  Stays active,  Does yard work.   These can last as long as 30 minutes .   No CP or palpitations with yard work . Typically occur at night  The CP starts first and then she has the palpitations  No recent myoview No recent monitor   August 09, 2016:  Shelby Walker is seen for follow up of her carotid disease  She had been having some chest pain . Myoview was low risk.  Still very active,  No further CP .   Has moderate carotid disease Has some burning in her legs with walking and at night   May 01, 2017.  Shelby Walker  is seen today for  follow-up of her claudication and episodes of chest pain.3 She is getting over pneumonia .   Leg pain / claudication  has improved.   Has seen Dr. Saunders Revel for PAD .  She was found to have noncritical peripheral arterial disease.  She has not wanted to have any procedure and at this point she has not needed it. Dr. Forde Dandy follows / manages her lipids and diabetes.  Will request labs from recent visit   Oct. 2, 2019:  [name] is seen today .  Seen with daughter , [name]  Had an episode of syncope several weeks ago ( was very hot , standing for 45 min at a funeral )  Had a mild episode of lightheadeeness yesterday  Admits that she does eat well or drink enough Associated with a fast HR ( felt that her heart was pounding )  Has mild carotid disease. Scheduled to have repeat carotid duplex later this month  Chief Complaint:  Carotid artery disease   History of Present Illness:    Seen withdaughter Shelby Walker is a 85 y.o. female with  Bilateral carotid artery disease.  She was hospitalized with diverticulitis in June, 2020. Getting her strength back slowly .  Eating well No cp or dyspnea No syncope.  No presyncope  She has been found to have PAF during one of the admissions for diverticulitis.   She also  developed C.Diff and had sepsis.  VS look good.   No evidence of irregular HR to suggest AF    The patient does not have symptoms concerning for COVID-19 infection (fever, chills, cough, or new shortness of breath).    Past Medical History:  Diagnosis Date  . Bruit    Carotid Doppler showed no significant abnormality     9per patient)  . C. difficile colitis 07/2018   with severe sepsis  . Chest pain, unspecified    Nuclear, May, 2008, no scar or ischemia  . Diabetes mellitus   . Diverticulosis   . Dyslipidemia   . Ejection fraction    EF 55-60%, echo, February, 2011  . GERD (gastroesophageal reflux disease)   . Mitral regurgitation April 21, 2009   mild,  echo,  February, 2011  . Osteoporosis   . Palpitations    possible very brief atrial fibrillation on monitor and possible reentrant tachycardia  . Psoriasis   . Rheumatoid arthritis [facility])    Past Surgical History:  Procedure Laterality Date  . FRACTURE SURGERY       Current Meds  Medication Sig  . acetaminophen (TYLENOL) 325 MG tablet Take 2 tablets (650 mg total) by mouth every 6 (six) hours as needed for mild pain (or Fever >/= 101).  Marland Kitchen apixaban (ELIQUIS) 2.5 MG TABS tablet Take 1 tablet (2.5 mg total) by mouth 2 (two) times daily for 30 days.  Marland Kitchen atorvastatin (LIPITOR) 20 MG tablet Take 1 tablet (20 mg total) by mouth daily.  . Calcium Carbonate-Vitamin D (CALTRATE 600+D) 600-400 MG-UNIT per tablet Take 1 tablet by mouth daily at 12 noon.   . carvedilol (COREG) 3.125 MG tablet Take 1 tablet (3.125 mg total) by mouth 2 (two) times daily with a meal.  . Cholecalciferol 25 MCG (1000 UT) capsule Take 1 capsule (1,000 Units total) by mouth daily.  Marland Kitchen dicyclomine (BENTYL) 10 MG capsule Take 10 mg by mouth as needed for spasms.  Marland Kitchen esomeprazole (NEXIUM) 40 MG capsule Take 1 capsule (40 mg total) by mouth daily at 12 noon.  . feeding supplement, ENSURE ENLIVE, (ENSURE ENLIVE) LIQD Take 237 mLs by mouth 2 (two) times daily between meals.  . insulin NPH Human (NOVOLIN N) 100 UNIT/ML injection Inject 10 Units into the skin 2 (two) times daily before a meal.  . megestrol (MEGACE) 40 MG tablet Take 1 tablet (40 mg total) by mouth daily.  . Multiple Vitamins-Minerals (CENTRUM SILVER 50+WOMEN PO) Take 1 tablet by mouth daily. 20 mg tablet  . nitroGLYCERIN (NITROSTAT) 0.4 MG SL tablet 1 TABLET UNDER TONGUE AT ONSET OF CHEST PAIN YOU MAY REPEAT EVERY 5 MINUTES FOR UP TO 3 DOSES.  . Omega-3 Fatty Acids (FISH OIL) 1200 MG CAPS Take 1 capsule by mouth daily at 12 noon.  . ondansetron (ZOFRAN) 4 MG tablet Take 1 tablet (4 mg total) by mouth daily as needed for nausea or vomiting.  Marland Kitchen oxycodone (OXY-IR) 5 MG capsule  Take 5 mg by mouth as needed.  . predniSONE (DELTASONE) 10 MG tablet Take 10 mg by mouth daily.   Marland Kitchen saccharomyces boulardii (FLORASTOR) 250 MG capsule Take 250 mg by mouth 2 (two) times daily.  Derrill Memo ON 10/03/2018] vancomycin (VANCOCIN HCL) 125 MG capsule Take 1 capsule (125 mg total) by mouth every 3 (three) days for 9 days.  Derrill Memo ON 09/18/2018] vancomycin (VANCOCIN HCL) 125 MG capsule Take 1 capsule (125 mg total) by mouth daily for 7 days, THEN 1 capsule (  125 mg total) every other day for 8 days.  . vancomycin (VANCOCIN HCL) 125 MG capsule Take 1 capsule (125 mg total) by mouth 4 (four) times daily for 12 days, THEN 1 capsule (125 mg total) 2 (two) times a day for 7 days.     Allergies:   Compazine [prochlorperazine edisylate], Sulfonamide derivatives, and Cholestyramine   Social History   Tobacco Use  . Smoking status: Never Smoker  . Smokeless tobacco: Never Used  Substance Use Topics  . Alcohol use: No    Alcohol/week: 0.0 standard drinks  . Drug use: No     Family Hx: The patient's family history includes Diabetes in her father and mother; Heart attack in her father and mother; Heart failure in her father and mother; Stroke in her sister.  ROS:   Please see the history of present illness.     All other systems reviewed and are negative.   Prior CV studies:   The following studies were reviewed today:    Labs/Other Tests and Data Reviewed:    EKG:  An ECG dated August 28, 2018 was personally reviewed today and demonstrated:  NSR .  the AF has resolved   Recent Labs: 07/24/2018: TSH 1.562 08/13/2018: Magnesium 2.2 09/01/2018: Hemoglobin 7.9; Platelets 281 09/02/2018: ALT 9; BUN 23; Creatinine, Ser 1.00; Potassium 3.4; Sodium 142   Recent Lipid Panel Lab Results  Component Value Date/Time   CHOL 129 12/24/2016 11:31 AM   TRIG 151 (H) 12/24/2016 11:31 AM   HDL 52 12/24/2016 11:31 AM   CHOLHDL 2.5 12/24/2016 11:31 AM   LDLCALC 47 12/24/2016 11:31 AM    Wt  Readings from Last 3 Encounters:  09/15/18 116 lb (52.6 kg)  09/04/18 148 lb 8 oz (67.4 kg)  09/03/18 115 lb 15.4 oz (52.6 kg)     Objective:    Vital Signs:  BP 129/62   Pulse 80   Temp 97.7 F (36.5 C)   Ht 5\' 1"  (1.549 m)   Wt 116 lb (52.6 kg)   BMI 21.92 kg/m    VITAL SIGNS:  reviewed GEN:  no acute distress EYES:  sclerae anicteric, EOMI - Extraocular Movements Intact RESPIRATORY:  normal respiratory effort, symmetric expansion CARDIOVASCULAR:  no peripheral edema SKIN:  no rash, lesions or ulcers. MUSCULOSKELETAL:  no obvious deformities. NEURO:  alert and oriented x 3, no obvious focal deficit PSYCH:  normal affect  ASSESSMENT & PLAN:    1. Paroxysmal atrial fibrillation: Patient had complicated hospitalization involving diverticulitis which eventually caused C. difficile colitis and sepsis.  She developed atrial fibrillation in the middle of all that.  She has been started on Eliquis 2.5 mg twice a day.  She was started on carvedilol.   She converted back to normal sinus rhythm.  Her heart rate and blood pressure have been well controlled. We discussed the importance of continuing the Eliquis.  At this point I think that the atrial fibrillation is a relatively minor issue.  2.  Carotid artery disease: She has mild carotid disease.  We will schedule her carotid scan after I see her again in approximately 6 months.  COVID-19 Education: The signs and symptoms of COVID-19 were discussed with the patient and how to seek care for testing (follow up with PCP or arrange E-visit).  The importance of social distancing was discussed today.  Time:   Today, I have spent  18  minutes with the patient with telehealth technology discussing the above problems.  Medication Adjustments/Labs and Tests Ordered: Current medicines are reviewed at length with the patient today.  Concerns regarding medicines are outlined above.   Tests Ordered: No orders of the defined types were  placed in this encounter.   Medication Changes: No orders of the defined types were placed in this encounter.   Follow Up:  Virtual Visit or In Person in 6 month(s)  Signed, Mertie Moores, MD  09/15/2018 6:31 PM    [place]

## 2018-09-16 DIAGNOSIS — I129 Hypertensive chronic kidney disease with stage 1 through stage 4 chronic kidney disease, or unspecified chronic kidney disease: Secondary | ICD-10-CM | POA: Diagnosis not present

## 2018-09-16 DIAGNOSIS — N189 Chronic kidney disease, unspecified: Secondary | ICD-10-CM | POA: Diagnosis not present

## 2018-09-16 DIAGNOSIS — I4891 Unspecified atrial fibrillation: Secondary | ICD-10-CM | POA: Diagnosis not present

## 2018-09-16 DIAGNOSIS — E46 Unspecified protein-calorie malnutrition: Secondary | ICD-10-CM | POA: Diagnosis not present

## 2018-09-16 DIAGNOSIS — E1151 Type 2 diabetes mellitus with diabetic peripheral angiopathy without gangrene: Secondary | ICD-10-CM | POA: Diagnosis not present

## 2018-09-16 DIAGNOSIS — E1122 Type 2 diabetes mellitus with diabetic chronic kidney disease: Secondary | ICD-10-CM | POA: Diagnosis not present

## 2018-09-17 DIAGNOSIS — E46 Unspecified protein-calorie malnutrition: Secondary | ICD-10-CM | POA: Diagnosis not present

## 2018-09-17 DIAGNOSIS — E1122 Type 2 diabetes mellitus with diabetic chronic kidney disease: Secondary | ICD-10-CM | POA: Diagnosis not present

## 2018-09-17 DIAGNOSIS — I4891 Unspecified atrial fibrillation: Secondary | ICD-10-CM | POA: Diagnosis not present

## 2018-09-17 DIAGNOSIS — I129 Hypertensive chronic kidney disease with stage 1 through stage 4 chronic kidney disease, or unspecified chronic kidney disease: Secondary | ICD-10-CM | POA: Diagnosis not present

## 2018-09-17 DIAGNOSIS — E1151 Type 2 diabetes mellitus with diabetic peripheral angiopathy without gangrene: Secondary | ICD-10-CM | POA: Diagnosis not present

## 2018-09-17 DIAGNOSIS — N189 Chronic kidney disease, unspecified: Secondary | ICD-10-CM | POA: Diagnosis not present

## 2018-09-18 ENCOUNTER — Inpatient Hospital Stay: Payer: Medicare Other | Admitting: Family

## 2018-09-19 DIAGNOSIS — E46 Unspecified protein-calorie malnutrition: Secondary | ICD-10-CM | POA: Diagnosis not present

## 2018-09-19 DIAGNOSIS — E1122 Type 2 diabetes mellitus with diabetic chronic kidney disease: Secondary | ICD-10-CM | POA: Diagnosis not present

## 2018-09-19 DIAGNOSIS — E1151 Type 2 diabetes mellitus with diabetic peripheral angiopathy without gangrene: Secondary | ICD-10-CM | POA: Diagnosis not present

## 2018-09-19 DIAGNOSIS — N189 Chronic kidney disease, unspecified: Secondary | ICD-10-CM | POA: Diagnosis not present

## 2018-09-19 DIAGNOSIS — I129 Hypertensive chronic kidney disease with stage 1 through stage 4 chronic kidney disease, or unspecified chronic kidney disease: Secondary | ICD-10-CM | POA: Diagnosis not present

## 2018-09-19 DIAGNOSIS — I4891 Unspecified atrial fibrillation: Secondary | ICD-10-CM | POA: Diagnosis not present

## 2018-09-22 ENCOUNTER — Other Ambulatory Visit: Payer: Self-pay | Admitting: Cardiovascular Disease

## 2018-09-22 DIAGNOSIS — I4891 Unspecified atrial fibrillation: Secondary | ICD-10-CM | POA: Diagnosis not present

## 2018-09-22 DIAGNOSIS — E1151 Type 2 diabetes mellitus with diabetic peripheral angiopathy without gangrene: Secondary | ICD-10-CM | POA: Diagnosis not present

## 2018-09-22 DIAGNOSIS — I129 Hypertensive chronic kidney disease with stage 1 through stage 4 chronic kidney disease, or unspecified chronic kidney disease: Secondary | ICD-10-CM | POA: Diagnosis not present

## 2018-09-22 DIAGNOSIS — E46 Unspecified protein-calorie malnutrition: Secondary | ICD-10-CM | POA: Diagnosis not present

## 2018-09-22 DIAGNOSIS — N189 Chronic kidney disease, unspecified: Secondary | ICD-10-CM | POA: Diagnosis not present

## 2018-09-22 DIAGNOSIS — E1122 Type 2 diabetes mellitus with diabetic chronic kidney disease: Secondary | ICD-10-CM | POA: Diagnosis not present

## 2018-09-23 DIAGNOSIS — E1151 Type 2 diabetes mellitus with diabetic peripheral angiopathy without gangrene: Secondary | ICD-10-CM | POA: Diagnosis not present

## 2018-09-23 DIAGNOSIS — N189 Chronic kidney disease, unspecified: Secondary | ICD-10-CM | POA: Diagnosis not present

## 2018-09-23 DIAGNOSIS — E1122 Type 2 diabetes mellitus with diabetic chronic kidney disease: Secondary | ICD-10-CM | POA: Diagnosis not present

## 2018-09-23 DIAGNOSIS — I4891 Unspecified atrial fibrillation: Secondary | ICD-10-CM | POA: Diagnosis not present

## 2018-09-23 DIAGNOSIS — E46 Unspecified protein-calorie malnutrition: Secondary | ICD-10-CM | POA: Diagnosis not present

## 2018-09-23 DIAGNOSIS — I129 Hypertensive chronic kidney disease with stage 1 through stage 4 chronic kidney disease, or unspecified chronic kidney disease: Secondary | ICD-10-CM | POA: Diagnosis not present

## 2018-09-24 DIAGNOSIS — M810 Age-related osteoporosis without current pathological fracture: Secondary | ICD-10-CM | POA: Diagnosis not present

## 2018-09-24 DIAGNOSIS — L409 Psoriasis, unspecified: Secondary | ICD-10-CM | POA: Diagnosis not present

## 2018-09-24 DIAGNOSIS — E782 Mixed hyperlipidemia: Secondary | ICD-10-CM | POA: Diagnosis not present

## 2018-09-24 DIAGNOSIS — M545 Low back pain: Secondary | ICD-10-CM | POA: Diagnosis not present

## 2018-09-24 DIAGNOSIS — I051 Rheumatic mitral insufficiency: Secondary | ICD-10-CM | POA: Diagnosis not present

## 2018-09-24 DIAGNOSIS — E1151 Type 2 diabetes mellitus with diabetic peripheral angiopathy without gangrene: Secondary | ICD-10-CM | POA: Diagnosis not present

## 2018-09-24 DIAGNOSIS — L89321 Pressure ulcer of left buttock, stage 1: Secondary | ICD-10-CM | POA: Diagnosis not present

## 2018-09-24 DIAGNOSIS — E1122 Type 2 diabetes mellitus with diabetic chronic kidney disease: Secondary | ICD-10-CM | POA: Diagnosis not present

## 2018-09-24 DIAGNOSIS — Z7901 Long term (current) use of anticoagulants: Secondary | ICD-10-CM | POA: Diagnosis not present

## 2018-09-24 DIAGNOSIS — Z9181 History of falling: Secondary | ICD-10-CM | POA: Diagnosis not present

## 2018-09-24 DIAGNOSIS — I129 Hypertensive chronic kidney disease with stage 1 through stage 4 chronic kidney disease, or unspecified chronic kidney disease: Secondary | ICD-10-CM | POA: Diagnosis not present

## 2018-09-24 DIAGNOSIS — E46 Unspecified protein-calorie malnutrition: Secondary | ICD-10-CM | POA: Diagnosis not present

## 2018-09-24 DIAGNOSIS — I6529 Occlusion and stenosis of unspecified carotid artery: Secondary | ICD-10-CM | POA: Diagnosis not present

## 2018-09-24 DIAGNOSIS — N183 Chronic kidney disease, stage 3 (moderate): Secondary | ICD-10-CM | POA: Diagnosis not present

## 2018-09-24 DIAGNOSIS — I952 Hypotension due to drugs: Secondary | ICD-10-CM | POA: Diagnosis not present

## 2018-09-24 DIAGNOSIS — Z7952 Long term (current) use of systemic steroids: Secondary | ICD-10-CM | POA: Diagnosis not present

## 2018-09-24 DIAGNOSIS — K219 Gastro-esophageal reflux disease without esophagitis: Secondary | ICD-10-CM | POA: Diagnosis not present

## 2018-09-24 DIAGNOSIS — Z794 Long term (current) use of insulin: Secondary | ICD-10-CM | POA: Diagnosis not present

## 2018-09-24 DIAGNOSIS — A0471 Enterocolitis due to Clostridium difficile, recurrent: Secondary | ICD-10-CM | POA: Diagnosis not present

## 2018-09-24 DIAGNOSIS — K5790 Diverticulosis of intestine, part unspecified, without perforation or abscess without bleeding: Secondary | ICD-10-CM | POA: Diagnosis not present

## 2018-09-24 DIAGNOSIS — M069 Rheumatoid arthritis, unspecified: Secondary | ICD-10-CM | POA: Diagnosis not present

## 2018-09-24 DIAGNOSIS — I480 Paroxysmal atrial fibrillation: Secondary | ICD-10-CM | POA: Diagnosis not present

## 2018-09-25 DIAGNOSIS — I129 Hypertensive chronic kidney disease with stage 1 through stage 4 chronic kidney disease, or unspecified chronic kidney disease: Secondary | ICD-10-CM | POA: Diagnosis not present

## 2018-09-25 DIAGNOSIS — L89321 Pressure ulcer of left buttock, stage 1: Secondary | ICD-10-CM | POA: Diagnosis not present

## 2018-09-25 DIAGNOSIS — E46 Unspecified protein-calorie malnutrition: Secondary | ICD-10-CM | POA: Diagnosis not present

## 2018-09-25 DIAGNOSIS — A0471 Enterocolitis due to Clostridium difficile, recurrent: Secondary | ICD-10-CM | POA: Diagnosis not present

## 2018-09-25 DIAGNOSIS — E1122 Type 2 diabetes mellitus with diabetic chronic kidney disease: Secondary | ICD-10-CM | POA: Diagnosis not present

## 2018-09-25 DIAGNOSIS — M069 Rheumatoid arthritis, unspecified: Secondary | ICD-10-CM | POA: Diagnosis not present

## 2018-09-26 DIAGNOSIS — E46 Unspecified protein-calorie malnutrition: Secondary | ICD-10-CM | POA: Diagnosis not present

## 2018-09-26 DIAGNOSIS — A0471 Enterocolitis due to Clostridium difficile, recurrent: Secondary | ICD-10-CM | POA: Diagnosis not present

## 2018-09-26 DIAGNOSIS — E1122 Type 2 diabetes mellitus with diabetic chronic kidney disease: Secondary | ICD-10-CM | POA: Diagnosis not present

## 2018-09-26 DIAGNOSIS — M069 Rheumatoid arthritis, unspecified: Secondary | ICD-10-CM | POA: Diagnosis not present

## 2018-09-26 DIAGNOSIS — I129 Hypertensive chronic kidney disease with stage 1 through stage 4 chronic kidney disease, or unspecified chronic kidney disease: Secondary | ICD-10-CM | POA: Diagnosis not present

## 2018-09-26 DIAGNOSIS — L89321 Pressure ulcer of left buttock, stage 1: Secondary | ICD-10-CM | POA: Diagnosis not present

## 2018-09-29 DIAGNOSIS — E46 Unspecified protein-calorie malnutrition: Secondary | ICD-10-CM | POA: Diagnosis not present

## 2018-09-29 DIAGNOSIS — E1122 Type 2 diabetes mellitus with diabetic chronic kidney disease: Secondary | ICD-10-CM | POA: Diagnosis not present

## 2018-09-29 DIAGNOSIS — L89321 Pressure ulcer of left buttock, stage 1: Secondary | ICD-10-CM | POA: Diagnosis not present

## 2018-09-29 DIAGNOSIS — I129 Hypertensive chronic kidney disease with stage 1 through stage 4 chronic kidney disease, or unspecified chronic kidney disease: Secondary | ICD-10-CM | POA: Diagnosis not present

## 2018-09-29 DIAGNOSIS — A0471 Enterocolitis due to Clostridium difficile, recurrent: Secondary | ICD-10-CM | POA: Diagnosis not present

## 2018-09-29 DIAGNOSIS — M069 Rheumatoid arthritis, unspecified: Secondary | ICD-10-CM | POA: Diagnosis not present

## 2018-09-30 ENCOUNTER — Telehealth: Payer: Self-pay | Admitting: General Surgery

## 2018-09-30 DIAGNOSIS — M069 Rheumatoid arthritis, unspecified: Secondary | ICD-10-CM | POA: Diagnosis not present

## 2018-09-30 DIAGNOSIS — A0471 Enterocolitis due to Clostridium difficile, recurrent: Secondary | ICD-10-CM | POA: Diagnosis not present

## 2018-09-30 DIAGNOSIS — E46 Unspecified protein-calorie malnutrition: Secondary | ICD-10-CM | POA: Diagnosis not present

## 2018-09-30 DIAGNOSIS — E1122 Type 2 diabetes mellitus with diabetic chronic kidney disease: Secondary | ICD-10-CM | POA: Diagnosis not present

## 2018-09-30 DIAGNOSIS — I129 Hypertensive chronic kidney disease with stage 1 through stage 4 chronic kidney disease, or unspecified chronic kidney disease: Secondary | ICD-10-CM | POA: Diagnosis not present

## 2018-09-30 DIAGNOSIS — L89321 Pressure ulcer of left buttock, stage 1: Secondary | ICD-10-CM | POA: Diagnosis not present

## 2018-09-30 NOTE — Telephone Encounter (Signed)
Covid-19 screening questions   Do you now or have you had a fever in the last 14 days? no  Do you have any respiratory symptoms of shortness of breath or cough now or in the last 14 days?no   Do you have any family members or close contacts with diagnosed or suspected Covid-19 in the past 14 days? no  Have you been tested for Covid-19 and found to be positive?no  The patient and healthcare partner are asked to wear a mask to the clinic. The patient verbalized understanding

## 2018-10-01 ENCOUNTER — Inpatient Hospital Stay: Payer: Medicare Other | Admitting: Physical Medicine & Rehabilitation

## 2018-10-01 ENCOUNTER — Other Ambulatory Visit (INDEPENDENT_AMBULATORY_CARE_PROVIDER_SITE_OTHER): Payer: Medicare Other

## 2018-10-01 ENCOUNTER — Ambulatory Visit (INDEPENDENT_AMBULATORY_CARE_PROVIDER_SITE_OTHER): Payer: Medicare Other | Admitting: Nurse Practitioner

## 2018-10-01 ENCOUNTER — Encounter: Payer: Self-pay | Admitting: Nurse Practitioner

## 2018-10-01 VITALS — BP 104/50 | HR 80 | Temp 97.8°F | Ht 61.0 in | Wt 117.2 lb

## 2018-10-01 DIAGNOSIS — D649 Anemia, unspecified: Secondary | ICD-10-CM

## 2018-10-01 DIAGNOSIS — I6529 Occlusion and stenosis of unspecified carotid artery: Secondary | ICD-10-CM | POA: Diagnosis not present

## 2018-10-01 DIAGNOSIS — A0472 Enterocolitis due to Clostridium difficile, not specified as recurrent: Secondary | ICD-10-CM | POA: Diagnosis not present

## 2018-10-01 LAB — CBC
HCT: 31.8 % — ABNORMAL LOW (ref 36.0–46.0)
Hemoglobin: 10.3 g/dL — ABNORMAL LOW (ref 12.0–15.0)
MCHC: 32.4 g/dL (ref 30.0–36.0)
MCV: 90.5 fl (ref 78.0–100.0)
Platelets: 269 10*3/uL (ref 150.0–400.0)
RBC: 3.51 Mil/uL — ABNORMAL LOW (ref 3.87–5.11)
RDW: 18.7 % — ABNORMAL HIGH (ref 11.5–15.5)
WBC: 12.6 10*3/uL — ABNORMAL HIGH (ref 4.0–10.5)

## 2018-10-01 NOTE — Progress Notes (Signed)
Chief Complaint:      IMPRESSION and PLAN:    52. 85 year old female with severe C-diff.   Seen in ED late May with abdominal pain.  CT scan suggested severe left-sided diverticulosis with fat stranding.  Patient given antibiotics, presumably for diverticulitis.  A couple weeks later she presented to the ED and was admitted to the hospital with SIRS / C. difficile colitis / AKI.  Repeat CT scan showed diffuse colitis.  Her symptoms have nearly resolved on vancomycin taper. -complete vancomycin taper, should be done around mid August -Continue Florastor for30 days following completion of vancomycin -Diarrhea has resolved but she is having 4-5 soft stools a day with some incontinence/seepage.  Not impacted on DRE today.  I will start her on a soluble fiber to maybe dry up some of the fluid component of the stool and help prevent seepage.  Advised that she can stop the Benefiber it makes her feel worse -She can liberalize her diet -Long discussion with patient and her daughter.  Her first CT scan may have represented diverticulitis.  Subsequent to that she probably got C. Diff colitis with diffuse colitis seen on second CT scan.  Of course underlying neoplasm always in the list of differential.  However, given advanced age, multiple comorbidities I think risks of colonoscopy outweigh the risks here.  She has not had any overt bleeding, the anemia is probably secondary to being hospitalized/critically ill.  I would like to recheck her counts today., see #2.    2. Normocytic anemia.  Baseline hgb in May 2020 was 11.2.  During June admission it drifted down to into the low 8 range and close to discharge was down to 7.9.  She was quite ill during that admission but I do not see where there was any evidence or documentation of GI blood loss.  Anemia probably secondary to critical illness/hospitalization.No overt bleeding at home - repeat CBC today  HPI:     Patient is an 85 year old female with  multiple medical problems not limited to RA, hypertension, CKD 3, diabetes, A. fib with RVR on Eliquis.   In early May patient seen in ED for abdominal pain . CT scan >>> severe sigmoid diverticulosis with wall thickening and fat stranding.  There was concern for diverticulitis, she was given a course of antibiotics.  A couple weeks later she developed crampy diarrhea, was admitted to the hospital with SIRS/C. difficile and AKI.  CT scan at that time showed DIFFUSE colitis.  She was seen in the hospital by ID after C. difficile returned positive.  She was started on the vancomycin taper .  I saw her in the office along with family on 09/04/2018, she was new to the practice.  She had no GI complaints at that time, was more worried about significant lower extremity swelling and weeping.  I had her continue the vancomycin taper.  We discussed the possibilities of what the CT findings could represent.  I asked her to return in a couple of weeks for follow-up . She is here today with her daughter and looks great.   Patient will finish vancomycin taper in mid August.  She is not having any abdominal pain, no diarrhea.  Her stools are soft .  She is struggling with incontinence and urgency at times, actually quite frequently.  Typically has a couple of normal bowel movements in the early part of the day but this is later followed by a few more small  volume bowel movements throughout the day.  It is these latter bowel movements that are causing problems.  She gets urgency, sometimes unable to make it to the bathroom.  Sometimes she is incontinent of soft stool.  She has been following a limited diet, was not sure if there were any restrictions with the recent GI problems.  Review of systems:     No chest pain, no SOB, no fevers, no urinary sx   Past Medical History:  Diagnosis Date  . Bruit    Carotid Doppler showed no significant abnormality     9per patient)  . C. difficile colitis 07/2018   with severe sepsis   . Chest pain, unspecified    Nuclear, May, 2008, no scar or ischemia  . Diabetes mellitus   . Diverticulosis   . Dyslipidemia   . Ejection fraction    EF 55-60%, echo, February, 2011  . GERD (gastroesophageal reflux disease)   . Mitral regurgitation April 21, 2009   mild,  echo, February, 2011  . Osteoporosis   . Palpitations    possible very brief atrial fibrillation on monitor and possible reentrant tachycardia  . Psoriasis   . Rheumatoid arthritis ([place])     Patient's surgical history, family medical history, social history, medications and allergies were all reviewed in Epic   Creatinine clearance cannot be calculated (Patient's most recent lab result is older than the maximum 21 days allowed.)  Current Outpatient Medications  Medication Sig Dispense Refill  . acetaminophen (TYLENOL) 325 MG tablet Take 2 tablets (650 mg total) by mouth every 6 (six) hours as needed for mild pain (or Fever >/= 101).    Marland Kitchen apixaban (ELIQUIS) 2.5 MG TABS tablet Take 1 tablet (2.5 mg total) by mouth 2 (two) times daily for 30 days. 60 tablet 0  . atorvastatin (LIPITOR) 20 MG tablet Take 1 tablet (20 mg total) by mouth daily. 90 tablet 3  . Calcium Carbonate-Vitamin D (CALTRATE 600+D) 600-400 MG-UNIT per tablet Take 1 tablet by mouth daily at 12 noon.     . carvedilol (COREG) 3.125 MG tablet TAKE 1 TABLET (3.125 MG TOTAL) BY MOUTH 2 (TWO) TIMES DAILY WITH A MEAL. 60 tablet 11  . Cholecalciferol 25 MCG (1000 UT) capsule Take 1 capsule (1,000 Units total) by mouth daily. 30 capsule 0  . dicyclomine (BENTYL) 10 MG capsule Take 10 mg by mouth as needed for spasms.    Marland Kitchen esomeprazole (NEXIUM) 40 MG capsule Take 1 capsule (40 mg total) by mouth daily at 12 noon. 30 capsule 0  . feeding supplement, ENSURE ENLIVE, (ENSURE ENLIVE) LIQD Take 237 mLs by mouth 2 (two) times daily between meals. 237 mL 12  . insulin NPH Human (NOVOLIN N) 100 UNIT/ML injection Inject 10 Units into the skin 3 (three) times daily. 15  in the morning, 10 at noon and 10 in the evening    . megestrol (MEGACE) 40 MG tablet Take 1 tablet (40 mg total) by mouth daily. 30 tablet 0  . Multiple Vitamins-Minerals (CENTRUM SILVER 50+WOMEN PO) Take 1 tablet by mouth daily. 20 mg tablet    . Omega-3 Fatty Acids (FISH OIL) 1200 MG CAPS Take 1 capsule by mouth daily at 12 noon.    . ondansetron (ZOFRAN) 4 MG tablet Take 1 tablet (4 mg total) by mouth daily as needed for nausea or vomiting. 30 tablet 1  . oxycodone (OXY-IR) 5 MG capsule Take 5 mg by mouth as needed.    . predniSONE (DELTASONE) 10  MG tablet Take 10 mg by mouth daily.   3  . saccharomyces boulardii (FLORASTOR) 250 MG capsule Take 250 mg by mouth 2 (two) times daily.    . vancomycin (VANCOCIN HCL) 125 MG capsule Take 1 capsule (125 mg total) by mouth daily for 7 days, THEN 1 capsule (125 mg total) every other day for 8 days. 11 capsule 0  . nitroGLYCERIN (NITROSTAT) 0.4 MG SL tablet 1 TABLET UNDER TONGUE AT ONSET OF CHEST PAIN YOU MAY REPEAT EVERY 5 MINUTES FOR UP TO 3 DOSES. (Patient not taking: Reported on 10/01/2018) 25 tablet 3   No current facility-administered medications for this visit.     Physical Exam:     BP (!) 104/50 (BP Location: Left Arm, Patient Position: Sitting, Cuff Size: Normal)   Pulse 80   Temp 97.8 F (36.6 C)   Ht 5\' 1"  (1.549 m)   Wt 117 lb 4 oz (53.2 kg)   BMI 22.15 kg/m   GENERAL:  Pleasant female in NAD PSYCH: : Cooperative, normal affect EENT:  conjunctiva pink, mucous membranes moist, neck supple without masses CARDIAC:  RRR,  no peripheral edema PULM: Normal respiratory effort, lungs CTA bilaterally, no wheezing ABDOMEN:  Nondistended, soft, nontender. No obvious masses, no hepatomegaly,  normal bowel sounds RECTAL: No masses felt.  She is not impacted SKIN:  turgor, no lesions seen Musculoskeletal:  Normal muscle tone, normal strength NEURO: Alert and oriented x 3, no focal neurologic deficits  I spent 25 minutes of face-to-face  time with the patient. Greater than 50% of the time was spent counseling and coordinating care. Questions answered  Tye Savoy , NP 10/01/2018, 2:07 PM

## 2018-10-01 NOTE — Patient Instructions (Addendum)
If you are age 83 or older, your body mass index should be between 23-30. Your Body mass index is 22.15 kg/m. If this is out of the aforementioned range listed, please consider follow up with your Primary Care Provider.  If you are age 15 or younger, your body mass index should be between 19-25. Your Body mass index is 22.15 kg/m. If this is out of the aformentioned range listed, please consider follow up with your Primary Care Provider.   Your provider has requested that you go to the basement level for lab work before leaving today. Press "B" on the elevator. The lab is located at the first door on the left as you exit the elevator.   Complete Vancomycin.  Continue Florastor for one month.  Start Benefiber once daily. ( this is over-the-counter)  Follow up as needed.  Thank you for choosing me and [facility].   Tye Savoy, NP

## 2018-10-02 DIAGNOSIS — A0471 Enterocolitis due to Clostridium difficile, recurrent: Secondary | ICD-10-CM | POA: Diagnosis not present

## 2018-10-02 DIAGNOSIS — M069 Rheumatoid arthritis, unspecified: Secondary | ICD-10-CM | POA: Diagnosis not present

## 2018-10-02 DIAGNOSIS — L89321 Pressure ulcer of left buttock, stage 1: Secondary | ICD-10-CM | POA: Diagnosis not present

## 2018-10-02 DIAGNOSIS — E46 Unspecified protein-calorie malnutrition: Secondary | ICD-10-CM | POA: Diagnosis not present

## 2018-10-02 DIAGNOSIS — I129 Hypertensive chronic kidney disease with stage 1 through stage 4 chronic kidney disease, or unspecified chronic kidney disease: Secondary | ICD-10-CM | POA: Diagnosis not present

## 2018-10-02 DIAGNOSIS — E1122 Type 2 diabetes mellitus with diabetic chronic kidney disease: Secondary | ICD-10-CM | POA: Diagnosis not present

## 2018-10-08 DIAGNOSIS — A0471 Enterocolitis due to Clostridium difficile, recurrent: Secondary | ICD-10-CM | POA: Diagnosis not present

## 2018-10-08 DIAGNOSIS — M069 Rheumatoid arthritis, unspecified: Secondary | ICD-10-CM | POA: Diagnosis not present

## 2018-10-08 DIAGNOSIS — L89321 Pressure ulcer of left buttock, stage 1: Secondary | ICD-10-CM | POA: Diagnosis not present

## 2018-10-08 DIAGNOSIS — E46 Unspecified protein-calorie malnutrition: Secondary | ICD-10-CM | POA: Diagnosis not present

## 2018-10-08 DIAGNOSIS — I129 Hypertensive chronic kidney disease with stage 1 through stage 4 chronic kidney disease, or unspecified chronic kidney disease: Secondary | ICD-10-CM | POA: Diagnosis not present

## 2018-10-08 DIAGNOSIS — E1122 Type 2 diabetes mellitus with diabetic chronic kidney disease: Secondary | ICD-10-CM | POA: Diagnosis not present

## 2018-10-10 ENCOUNTER — Telehealth: Payer: Self-pay | Admitting: Internal Medicine

## 2018-10-10 DIAGNOSIS — A0472 Enterocolitis due to Clostridium difficile, not specified as recurrent: Secondary | ICD-10-CM | POA: Diagnosis not present

## 2018-10-10 DIAGNOSIS — N183 Chronic kidney disease, stage 3 (moderate): Secondary | ICD-10-CM | POA: Diagnosis not present

## 2018-10-10 DIAGNOSIS — R5381 Other malaise: Secondary | ICD-10-CM | POA: Diagnosis not present

## 2018-10-10 DIAGNOSIS — E1129 Type 2 diabetes mellitus with other diabetic kidney complication: Secondary | ICD-10-CM | POA: Diagnosis not present

## 2018-10-10 DIAGNOSIS — I129 Hypertensive chronic kidney disease with stage 1 through stage 4 chronic kidney disease, or unspecified chronic kidney disease: Secondary | ICD-10-CM | POA: Diagnosis not present

## 2018-10-10 DIAGNOSIS — E8770 Fluid overload, unspecified: Secondary | ICD-10-CM | POA: Diagnosis not present

## 2018-10-10 DIAGNOSIS — R627 Adult failure to thrive: Secondary | ICD-10-CM | POA: Diagnosis not present

## 2018-10-10 NOTE — Telephone Encounter (Signed)
Most recently seen by Tye Savoy, NP  Patient with recurrent C-diff and recent hospitalization. She is at the end a taper of Vancomycin. Had Vanco on Monday 10/06/18 and again 10/09/18. Yesterday she began having diarrhea. It has progresses to watery foul smelling diarrhea. Patient has rapidly become weaker. She is alert and able to ambulate with help. T100.9 orally. She is taking fluids. Daughter calls for the patient. Please advise.

## 2018-10-10 NOTE — Telephone Encounter (Signed)
I spoke to daughter  Patient febrile and having some cramps  Daughter thinks ok at home now  Plan:  Take vancomycin 125 mg qid (they have #20 at home now) 2) Use dicyclomine 10-20 mg q6 prn cramps (they have) 3) Call us back Monday with update - if deteriorating call on call or go to ED 4) Force fluids - I am sending oral rehydration recipes by My Chart

## 2018-10-13 NOTE — Telephone Encounter (Signed)
Patient daughter, [name] calling in with patient update.   Patient has not had a fever since Friday. She is still having abd cramping but dicyclomine is helping. Patient is still having blood and mucous in stool. Best # [phone]

## 2018-10-13 NOTE — Telephone Encounter (Signed)
Pts daughter called in with an update, see note below.

## 2018-10-13 NOTE — Telephone Encounter (Signed)
Daughter [name] informed of Mom's appointment date/time:10/21/2018 at 3:25pm, I told her to come at 3:15pm.

## 2018-10-13 NOTE — Telephone Encounter (Signed)
I confirmed that patient is improved  she has a new vancomycin rx from her PCP and will get that and stay on qid  I told daughter we would see her next week  Please use my 8/18 325 hemorrhoid banding slot for that appointment and tell the daughter to confirm  Advised increased protein intake  Has been off Embrel also so RA worse and prednisone increased  I will see if ok to retry [employer]

## 2018-10-14 DIAGNOSIS — E46 Unspecified protein-calorie malnutrition: Secondary | ICD-10-CM | POA: Diagnosis not present

## 2018-10-14 DIAGNOSIS — A0471 Enterocolitis due to Clostridium difficile, recurrent: Secondary | ICD-10-CM | POA: Diagnosis not present

## 2018-10-14 DIAGNOSIS — E1122 Type 2 diabetes mellitus with diabetic chronic kidney disease: Secondary | ICD-10-CM | POA: Diagnosis not present

## 2018-10-14 DIAGNOSIS — L89321 Pressure ulcer of left buttock, stage 1: Secondary | ICD-10-CM | POA: Diagnosis not present

## 2018-10-14 DIAGNOSIS — M069 Rheumatoid arthritis, unspecified: Secondary | ICD-10-CM | POA: Diagnosis not present

## 2018-10-14 DIAGNOSIS — I129 Hypertensive chronic kidney disease with stage 1 through stage 4 chronic kidney disease, or unspecified chronic kidney disease: Secondary | ICD-10-CM | POA: Diagnosis not present

## 2018-10-17 DIAGNOSIS — E46 Unspecified protein-calorie malnutrition: Secondary | ICD-10-CM | POA: Diagnosis not present

## 2018-10-17 DIAGNOSIS — A0471 Enterocolitis due to Clostridium difficile, recurrent: Secondary | ICD-10-CM | POA: Diagnosis not present

## 2018-10-17 DIAGNOSIS — E1122 Type 2 diabetes mellitus with diabetic chronic kidney disease: Secondary | ICD-10-CM | POA: Diagnosis not present

## 2018-10-17 DIAGNOSIS — I129 Hypertensive chronic kidney disease with stage 1 through stage 4 chronic kidney disease, or unspecified chronic kidney disease: Secondary | ICD-10-CM | POA: Diagnosis not present

## 2018-10-17 DIAGNOSIS — M069 Rheumatoid arthritis, unspecified: Secondary | ICD-10-CM | POA: Diagnosis not present

## 2018-10-17 DIAGNOSIS — L89321 Pressure ulcer of left buttock, stage 1: Secondary | ICD-10-CM | POA: Diagnosis not present

## 2018-10-18 ENCOUNTER — Other Ambulatory Visit: Payer: Self-pay | Admitting: Cardiovascular Disease

## 2018-10-19 DIAGNOSIS — I10 Essential (primary) hypertension: Secondary | ICD-10-CM | POA: Diagnosis not present

## 2018-10-19 DIAGNOSIS — E1165 Type 2 diabetes mellitus with hyperglycemia: Secondary | ICD-10-CM | POA: Diagnosis not present

## 2018-10-19 DIAGNOSIS — E161 Other hypoglycemia: Secondary | ICD-10-CM | POA: Diagnosis not present

## 2018-10-19 DIAGNOSIS — R404 Transient alteration of awareness: Secondary | ICD-10-CM | POA: Diagnosis not present

## 2018-10-19 DIAGNOSIS — E162 Hypoglycemia, unspecified: Secondary | ICD-10-CM | POA: Diagnosis not present

## 2018-10-20 NOTE — Telephone Encounter (Signed)
37f  53.2kg Scr 1.00 09/02/18 Lovw/nahser 09/15/18

## 2018-10-21 ENCOUNTER — Other Ambulatory Visit: Payer: Self-pay

## 2018-10-21 ENCOUNTER — Encounter: Payer: Self-pay | Admitting: Internal Medicine

## 2018-10-21 ENCOUNTER — Ambulatory Visit (INDEPENDENT_AMBULATORY_CARE_PROVIDER_SITE_OTHER): Payer: Medicare Other | Admitting: Internal Medicine

## 2018-10-21 VITALS — BP 132/62 | Temp 98.6°F | Ht 61.0 in | Wt 120.0 lb

## 2018-10-21 DIAGNOSIS — E46 Unspecified protein-calorie malnutrition: Secondary | ICD-10-CM | POA: Diagnosis not present

## 2018-10-21 DIAGNOSIS — M069 Rheumatoid arthritis, unspecified: Secondary | ICD-10-CM | POA: Diagnosis not present

## 2018-10-21 DIAGNOSIS — A0471 Enterocolitis due to Clostridium difficile, recurrent: Secondary | ICD-10-CM | POA: Diagnosis not present

## 2018-10-21 DIAGNOSIS — Z79899 Other long term (current) drug therapy: Secondary | ICD-10-CM

## 2018-10-21 DIAGNOSIS — L89321 Pressure ulcer of left buttock, stage 1: Secondary | ICD-10-CM | POA: Diagnosis not present

## 2018-10-21 DIAGNOSIS — E1165 Type 2 diabetes mellitus with hyperglycemia: Secondary | ICD-10-CM

## 2018-10-21 DIAGNOSIS — I6529 Occlusion and stenosis of unspecified carotid artery: Secondary | ICD-10-CM

## 2018-10-21 DIAGNOSIS — I129 Hypertensive chronic kidney disease with stage 1 through stage 4 chronic kidney disease, or unspecified chronic kidney disease: Secondary | ICD-10-CM | POA: Diagnosis not present

## 2018-10-21 DIAGNOSIS — Z7960 Long term (current) use of unspecified immunomodulators and immunosuppressants: Secondary | ICD-10-CM

## 2018-10-21 DIAGNOSIS — E1122 Type 2 diabetes mellitus with diabetic chronic kidney disease: Secondary | ICD-10-CM | POA: Diagnosis not present

## 2018-10-21 NOTE — Patient Instructions (Addendum)
Dr Carlean Purl is going to call you with the next steps.  Stay on your Vancomycin.  Continue your low fiber diet, handout provided.  I appreciate the opportunity to care for you. Silvano Rusk, MD, [facility]

## 2018-10-21 NOTE — Progress Notes (Addendum)
Shelby Walker 85 y.o. 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  This occurred on a long taper with treatment of second episode.  Planning on tapering the vancomycin but need to determine whether we could use a Xifaxan chaser, Dificid or perhaps Zinplava.  The Zinplava monoclonal antibody has a risk of heart failure, she has no history of that but she is 85.  It is concerning that she recurred on a taper so I think we need to do something else and I will discuss with infectious disease.  Plan for a taper of her vancomycin for now.  Other consideration for Zinplava would be concomitant use of Enbrel  Low fiber diet recommendations given as well she has been eating pretty much whatever she wants and this may help improve symptoms  Rheumatoid arthritis flare ([place]) She is off her Enbrel.  Rheumatology think she should stay off it.  We often keep people on their Biologics when they have a C. difficile flare in IBD.  I think the risk-benefit ratio favors going back on the Enbrel to reduce the hyperglycemia and to treat her rheumatoid arthritis.  Will discuss with Dr. Amil Amen  Uncontrolled type 2 diabetes mellitus with hyperglycemia ([place]) The prednisone required to treat her rheumatoid arthritis while off Enbrel is causing hyperglycemia.  Hopefully we can get her back on Enbrel and reduce the prednisone and resolve this.  Long-term use of immunosuppressant medication-Enbrel Will discuss resuming this as mentioned and other assessments and plans  I appreciate the opportunity to care for this patient.   Addendum 10/30/2018 D/w Dr. Baxter Flattery Plan for a Xifaxan regimen near end of vancomycin taper Samples scured Dr. Baxter Flattery and I both think ok to use enbrel in her case. Note to dr. Amil Amen  CC: Reynold Bowen, MD Dr. Leigh Aurora Dr. Carlyle Basques Subjective:   Chief Complaint:  Recurrent C. difficile colitis  HPI Patient is here with her daughter, she has had now 3 episodes of C. difficile, she was hospitalized twice the first time in ICU in early June, was seen by [place] in July Tye Savoy, NP) and was doing well on a taper.  As she was continuing to taper she developed recurrent symptoms of watery diarrhea and so I started her back on vancomycin 125 mg 4 times daily on August 7 and she remains on that and is improved.  She has been using oral rehydration solutions with success.  Her rheumatoid arthritis is bothering her she is on 10 mg of prednisone and this is raising her sugar significantly.  Dr. Amil Amen does not want her back on her Enbrel until she is off treatment for C. difficile. Allergies  Allergen Reactions  . Compazine [Prochlorperazine Edisylate] Other (See Comments)    REACTION: " tongue swell and unable to swallow"  . Sulfonamide Derivatives Other (See Comments)    REACTION: " broke out with fine itching bumps"  . Cholestyramine     Possible- Makes throat burn. Patient said she can't take it    Current Meds  Medication Sig  . acetaminophen (TYLENOL) 325 MG tablet Take 2 tablets (650 mg total) by mouth every 6 (six) hours as needed for mild pain (or Fever >/= 101).  Marland Kitchen atorvastatin (LIPITOR) 20 MG tablet Take 1 tablet (20 mg total) by mouth daily.  . Calcium Carbonate-Vitamin D (CALTRATE 600+D) 600-400 MG-UNIT per tablet Take 1 tablet by mouth daily  at 12 noon.   . carvedilol (COREG) 3.125 MG tablet TAKE 1 TABLET (3.125 MG TOTAL) BY MOUTH 2 (TWO) TIMES DAILY WITH A MEAL.  Marland Kitchen Cholecalciferol 25 MCG (1000 UT) capsule Take 1 capsule (1,000 Units total) by mouth daily.  Marland Kitchen dicyclomine (BENTYL) 10 MG capsule Take 10 mg by mouth as needed for spasms.  Marland Kitchen ELIQUIS 2.5 MG TABS tablet TAKE 1 TABLET (2.5 MG TOTAL) BY MOUTH 2 (TWO) TIMES DAILY FOR 30 DAYS.  Marland Kitchen esomeprazole (NEXIUM) 40 MG capsule Take 1 capsule (40 mg total) by mouth daily at 12 noon.  . feeding  supplement, ENSURE ENLIVE, (ENSURE ENLIVE) LIQD Take 237 mLs by mouth 2 (two) times daily between meals.  . insulin lispro (HUMALOG) 100 UNIT/ML injection Inject 5 Units into the skin 3 (three) times daily before meals. 5 units over 220 check and repeat every 4 hours as needed  . insulin NPH Human (NOVOLIN N) 100 UNIT/ML injection 18 in the morning, 8 units at dinner  . Multiple Vitamins-Minerals (CENTRUM SILVER 50+WOMEN PO) Take 1 tablet by mouth daily. 20 mg tablet  . nitroGLYCERIN (NITROSTAT) 0.4 MG SL tablet 1 TABLET UNDER TONGUE AT ONSET OF CHEST PAIN YOU MAY REPEAT EVERY 5 MINUTES FOR UP TO 3 DOSES.  . Omega-3 Fatty Acids (FISH OIL) 1200 MG CAPS Take 1 capsule by mouth daily at 12 noon.  . ondansetron (ZOFRAN) 4 MG tablet Take 1 tablet (4 mg total) by mouth daily as needed for nausea or vomiting.  Marland Kitchen oxycodone (OXY-IR) 5 MG capsule Take 5 mg by mouth as needed.  . predniSONE (DELTASONE) 10 MG tablet Take 10 mg by mouth daily.   Marland Kitchen saccharomyces boulardii (FLORASTOR) 250 MG capsule Take 250 mg by mouth 2 (two) times daily.  . vancomycin (VANCOCIN) 125 MG capsule Take 1 capsule (125 mg total) by mouth 4 (four) times daily.   Past Medical History:  Diagnosis Date  . Bruit    Carotid Doppler showed no significant abnormality     9per patient)  . C. difficile colitis 07/2018   with severe sepsis  . Chest pain, unspecified    Nuclear, May, 2008, no scar or ischemia  . Diabetes mellitus   . Diverticulosis   . Dyslipidemia   . Ejection fraction    EF 55-60%, echo, February, 2011  . GERD (gastroesophageal reflux disease)   . Mitral regurgitation April 21, 2009   mild,  echo, February, 2011  . Osteoporosis   . Palpitations    possible very brief atrial fibrillation on monitor and possible reentrant tachycardia  . Psoriasis   . Rheumatoid arthritis [facility])    Past Surgical History:  Procedure Laterality Date  . FRACTURE SURGERY     Social History   Social History Narrative   She is  married to [place] and lives with him 2 children   Retired   No alcohol or drugs and never smoker   family history includes Diabetes in her father and mother; Heart attack in her father and mother; Heart failure in her father and mother; Stroke in her sister.   Review of Systems As above  Objective:   Physical Exam BP 132/62   Temp 98.6 F (37 C) (Oral)   Ht 5\' 1"  (1.549 m)   Wt 120 lb (54.4 kg)   BMI 22.67 kg/m  Well-developed elderly white woman no acute distress The eyes are anicteric She is alert and oriented x3 She has an appropriate mood and affect Severe rheumatoid changes of  the hands and wrists are noted

## 2018-10-22 DIAGNOSIS — A0472 Enterocolitis due to Clostridium difficile, not specified as recurrent: Secondary | ICD-10-CM | POA: Diagnosis not present

## 2018-10-22 DIAGNOSIS — N183 Chronic kidney disease, stage 3 (moderate): Secondary | ICD-10-CM | POA: Diagnosis not present

## 2018-10-22 DIAGNOSIS — Z794 Long term (current) use of insulin: Secondary | ICD-10-CM | POA: Diagnosis not present

## 2018-10-22 DIAGNOSIS — E1129 Type 2 diabetes mellitus with other diabetic kidney complication: Secondary | ICD-10-CM | POA: Diagnosis not present

## 2018-10-22 DIAGNOSIS — I129 Hypertensive chronic kidney disease with stage 1 through stage 4 chronic kidney disease, or unspecified chronic kidney disease: Secondary | ICD-10-CM | POA: Diagnosis not present

## 2018-10-23 ENCOUNTER — Telehealth: Payer: Self-pay | Admitting: Internal Medicine

## 2018-10-23 MED ORDER — VANCOMYCIN HCL 125 MG PO CAPS: ORAL_CAPSULE | ORAL | 0 refills | Status: AC

## 2018-10-23 NOTE — Telephone Encounter (Signed)
Please advise Sir? 

## 2018-10-23 NOTE — Telephone Encounter (Signed)
I faxed a taper Rx into pharmacy

## 2018-10-23 NOTE — Telephone Encounter (Signed)
Pt's daughter [name] informed that pt will run out of vancomycin this weekend and would like to know how to proceed.

## 2018-10-24 NOTE — Telephone Encounter (Signed)
I called the pharmacy and confirmed that rx went thru and notified [name] (daughter) . She said Dr Carlean Purl had called her last night.

## 2018-10-26 ENCOUNTER — Encounter: Payer: Self-pay | Admitting: Internal Medicine

## 2018-10-26 DIAGNOSIS — Z7960 Long term (current) use of unspecified immunomodulators and immunosuppressants: Secondary | ICD-10-CM | POA: Insufficient documentation

## 2018-10-26 DIAGNOSIS — E1165 Type 2 diabetes mellitus with hyperglycemia: Secondary | ICD-10-CM | POA: Insufficient documentation

## 2018-10-26 DIAGNOSIS — Z79899 Other long term (current) drug therapy: Secondary | ICD-10-CM | POA: Insufficient documentation

## 2018-10-26 NOTE — Assessment & Plan Note (Signed)
Will discuss resuming this as mentioned and other assessments and plans

## 2018-10-26 NOTE — Assessment & Plan Note (Addendum)
She is improved again on vancomycin with this third episode.  This occurred on a long taper with treatment of second episode.  Planning on tapering the vancomycin but need to determine whether we could use a Xifaxan chaser, Dificid or perhaps Zinplava.  The Zinplava monoclonal antibody has a risk of heart failure, she has no history of that but she is 85.  It is concerning that she recurred on a taper so I think we need to do something else and I will discuss with infectious disease.  Plan for a taper of her vancomycin for now.  Other consideration for Zinplava would be concomitant use of Enbrel  Low fiber diet recommendations given as well she has been eating pretty much whatever she wants and this may help improve symptoms

## 2018-10-26 NOTE — Assessment & Plan Note (Signed)
She is off her Enbrel.  Rheumatology think she should stay off it.  We often keep people on their Biologics when they have a C. difficile flare in IBD.  I think the risk-benefit ratio favors going back on the Enbrel to reduce the hyperglycemia and to treat her rheumatoid arthritis.  Will discuss with Dr. Amil Amen

## 2018-10-26 NOTE — Assessment & Plan Note (Signed)
The prednisone required to treat her rheumatoid arthritis while off Enbrel is causing hyperglycemia.  Hopefully we can get her back on Enbrel and reduce the prednisone and resolve this.

## 2018-10-30 ENCOUNTER — Encounter: Payer: Self-pay | Admitting: Internal Medicine

## 2018-10-30 ENCOUNTER — Telehealth: Payer: Self-pay | Admitting: Internal Medicine

## 2018-10-30 MED ORDER — RIFAXIMIN 550 MG PO TABS: 550.0000 mg | ORAL_TABLET | Freq: Three times a day (TID) | ORAL | 0 refills | Status: DC

## 2018-10-30 NOTE — Telephone Encounter (Signed)
I spoke to [place] and also left a detailed message for [place]. Per [place] they will be in town tomorrow and can come by and pick up the xifaxan samples. I will go over the instructions when [name] comes tomorrow.

## 2018-10-30 NOTE — Telephone Encounter (Signed)
Need to contact daughter and explain that I have reviewed Shelby Walker case with infectious disease and we think best treatment option now is to add Xifaxan at the end of her vancomycin treatment  Samples of Xifaxan to be taken 550 mg tid x 14 days  She needs to start this when she begins every other day vancomycin and take them both at the same time and then still finish the vancomycin after as already prescribed (so will be some time on vancomycin every 3 days after finishes xifaxan)  Needs f/u me early October please   Will give copy of letter I sent to Dr. Amil Amen also

## 2018-10-31 DIAGNOSIS — Z6826 Body mass index (BMI) 26.0-26.9, adult: Secondary | ICD-10-CM | POA: Diagnosis not present

## 2018-10-31 DIAGNOSIS — L409 Psoriasis, unspecified: Secondary | ICD-10-CM | POA: Diagnosis not present

## 2018-10-31 DIAGNOSIS — E663 Overweight: Secondary | ICD-10-CM | POA: Diagnosis not present

## 2018-10-31 DIAGNOSIS — M2550 Pain in unspecified joint: Secondary | ICD-10-CM | POA: Diagnosis not present

## 2018-10-31 DIAGNOSIS — Z79899 Other long term (current) drug therapy: Secondary | ICD-10-CM | POA: Diagnosis not present

## 2018-10-31 DIAGNOSIS — M810 Age-related osteoporosis without current pathological fracture: Secondary | ICD-10-CM | POA: Diagnosis not present

## 2018-10-31 DIAGNOSIS — M150 Primary generalized (osteo)arthritis: Secondary | ICD-10-CM | POA: Diagnosis not present

## 2018-10-31 DIAGNOSIS — M069 Rheumatoid arthritis, unspecified: Secondary | ICD-10-CM | POA: Diagnosis not present

## 2018-10-31 NOTE — Telephone Encounter (Signed)
Shelby Walker came by and got the samples and we went over the instructions.

## 2018-11-04 DIAGNOSIS — E11319 Type 2 diabetes mellitus with unspecified diabetic retinopathy without macular edema: Secondary | ICD-10-CM | POA: Diagnosis not present

## 2018-11-06 ENCOUNTER — Telehealth: Payer: Self-pay | Admitting: Internal Medicine

## 2018-11-06 NOTE — Telephone Encounter (Signed)
Dr. Carlean Purl please advise on how long she should take [facility]

## 2018-11-06 NOTE — Telephone Encounter (Signed)
OK to stop that and we can consider taking that when she comes off the vancomycin

## 2018-11-06 NOTE — Telephone Encounter (Signed)
Patient's daughter notified.

## 2018-11-11 DIAGNOSIS — A0472 Enterocolitis due to Clostridium difficile, not specified as recurrent: Secondary | ICD-10-CM | POA: Diagnosis not present

## 2018-11-11 DIAGNOSIS — L22 Diaper dermatitis: Secondary | ICD-10-CM | POA: Diagnosis not present

## 2018-11-18 ENCOUNTER — Other Ambulatory Visit: Payer: Self-pay | Admitting: Cardiovascular Disease

## 2018-11-18 DIAGNOSIS — H04123 Dry eye syndrome of bilateral lacrimal glands: Secondary | ICD-10-CM | POA: Diagnosis not present

## 2018-11-18 MED ORDER — APIXABAN 2.5 MG PO TABS: ORAL_TABLET | ORAL | 5 refills | Status: DC

## 2018-11-18 NOTE — Telephone Encounter (Signed)
Prescription refill request for Eliquis received.  Last office visit: [place]  (09-15-2018 telemedicine) Scr: 1.0 (09-02-2018) Age: 85 y.o. Weight: 54.4kg   Prescription refill sent.

## 2018-11-18 NOTE — Addendum Note (Signed)
Addended by: Johny Shock B on: 11/18/2018 11:09 AM   Modules accepted: Orders

## 2018-11-21 DIAGNOSIS — L659 Nonscarring hair loss, unspecified: Secondary | ICD-10-CM | POA: Diagnosis not present

## 2018-11-21 DIAGNOSIS — A0472 Enterocolitis due to Clostridium difficile, not specified as recurrent: Secondary | ICD-10-CM | POA: Diagnosis not present

## 2018-12-03 ENCOUNTER — Telehealth: Payer: Self-pay | Admitting: Internal Medicine

## 2018-12-03 NOTE — Telephone Encounter (Signed)
Pts daughter states pt took pepto recently and then her PCP started her on Iron pills, stated she was anemic. Discussed with daughter that Iron and pepto make the stool dark/black color. Suggested she contact her PCP to have CBC checked to make sure it is stable. Daugther verbalized understanding. Pt has OV scheduled with Dr. Carlean Purl 12/12/18.

## 2018-12-10 DIAGNOSIS — I129 Hypertensive chronic kidney disease with stage 1 through stage 4 chronic kidney disease, or unspecified chronic kidney disease: Secondary | ICD-10-CM | POA: Diagnosis not present

## 2018-12-10 DIAGNOSIS — E119 Type 2 diabetes mellitus without complications: Secondary | ICD-10-CM | POA: Diagnosis not present

## 2018-12-10 DIAGNOSIS — Z23 Encounter for immunization: Secondary | ICD-10-CM | POA: Diagnosis not present

## 2018-12-10 DIAGNOSIS — N183 Chronic kidney disease, stage 3 unspecified: Secondary | ICD-10-CM | POA: Diagnosis not present

## 2018-12-10 DIAGNOSIS — M069 Rheumatoid arthritis, unspecified: Secondary | ICD-10-CM | POA: Diagnosis not present

## 2018-12-10 DIAGNOSIS — Z794 Long term (current) use of insulin: Secondary | ICD-10-CM | POA: Diagnosis not present

## 2018-12-12 ENCOUNTER — Encounter: Payer: Self-pay | Admitting: Internal Medicine

## 2018-12-12 ENCOUNTER — Ambulatory Visit (INDEPENDENT_AMBULATORY_CARE_PROVIDER_SITE_OTHER): Payer: Medicare Other | Admitting: Internal Medicine

## 2018-12-12 ENCOUNTER — Other Ambulatory Visit: Payer: Self-pay

## 2018-12-12 VITALS — BP 122/62 | HR 79 | Temp 97.9°F | Ht 60.0 in | Wt 128.0 lb

## 2018-12-12 DIAGNOSIS — I6529 Occlusion and stenosis of unspecified carotid artery: Secondary | ICD-10-CM

## 2018-12-12 DIAGNOSIS — K580 Irritable bowel syndrome with diarrhea: Secondary | ICD-10-CM | POA: Diagnosis not present

## 2018-12-12 DIAGNOSIS — K6289 Other specified diseases of anus and rectum: Secondary | ICD-10-CM | POA: Diagnosis not present

## 2018-12-12 DIAGNOSIS — R159 Full incontinence of feces: Secondary | ICD-10-CM | POA: Diagnosis not present

## 2018-12-12 DIAGNOSIS — L650 Telogen effluvium: Secondary | ICD-10-CM

## 2018-12-12 DIAGNOSIS — L309 Dermatitis, unspecified: Secondary | ICD-10-CM

## 2018-12-12 DIAGNOSIS — A0471 Enterocolitis due to Clostridium difficile, recurrent: Secondary | ICD-10-CM

## 2018-12-12 MED ORDER — COLESTIPOL HCL 5 G PO GRAN: 5.0000 g | GRANULES | Freq: Every day | ORAL | 1 refills | Status: DC

## 2018-12-12 NOTE — Patient Instructions (Addendum)
Per Dr Carlean Purl please stop your Florastor and fish oil.   We have sent the following medications to your pharmacy for you to pick up at your convenience: colestipol   We are giving you samples of calmoseptine to put on your rash. This is over the counter.   Please follow up with Dr Carlean Purl around the end of November.    I appreciate the opportunity to care for you. Silvano Rusk, MD, [facility]

## 2018-12-12 NOTE — Progress Notes (Signed)
Shelby Walker 85 y.o. 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 ? Post IBS  Irritable bowel syndrome with diarrhea - post infectious Treat with colestipol.  Stop fish oil stop Florastor return to clinic in 6 weeks  Perianal dermatitis Undoubtably related to fecal incontinence and soiling.  Treat with calmoseptine hopefully we will get control of her diarrhea and incontinence.  Telogen effluvium I think this may be why she lost her hair given her problems with severe C diff diarrhea  Decreased anal sphincter tone May be age-appropriate certainly contributes to some of her problems.  Hold physical therapy as a potential option in reserve.  Work on loose stools first.  I appreciate the opportunity to care for this patient. CC: Shelby Bowen, MD Shelby Chimes, PA-C    Subjective:   Chief Complaint: Follow-up of diarrhea and C. difficile still having diarrhea and fecal incontinence is a big problem  HPI Patient is here with her daughter, she is better without watery stools after prolonged taper for C. difficile diarrhea that was recurrent.  3 episodes.  The last one we treated with a long taper and a Xifaxan chaser.  Samples were provided.  So no watery stools no significant abdominal pain no fevers.  However she is having 4-5 episodes of incontinence a day with soft stools and it is quite a problem.  Her perianal area is irritated.  Additionally her hair has fallen out and she is wearing a wig.  Primary care visit with labs did not reveal a cause for alopecia.   She has successfully started Enbrel and has started a taper of her prednisone and says her joints are better but not where they need to be yet Allergies  Allergen Reactions   Compazine [Prochlorperazine Edisylate] Other (See Comments)    REACTION: " tongue swell and unable to  swallow"   Sulfonamide Derivatives Other (See Comments)    REACTION: " broke out with fine itching bumps"   Cholestyramine     Possible- Makes throat burn. Patient said she can't take it    Current Meds  Medication Sig   acetaminophen (TYLENOL) 325 MG tablet Take 2 tablets (650 mg total) by mouth every 6 (six) hours as needed for mild pain (or Fever >/= 101).   apixaban (ELIQUIS) 2.5 MG TABS tablet Take 1 tablet by mouth 2 times daily   atorvastatin (LIPITOR) 20 MG tablet Take 1 tablet (20 mg total) by mouth daily.   Calcium Carbonate-Vitamin D (CALTRATE 600+D) 600-400 MG-UNIT per tablet Take 1 tablet by mouth daily at 12 noon.    carvedilol (COREG) 3.125 MG tablet TAKE 1 TABLET (3.125 MG TOTAL) BY MOUTH 2 (TWO) TIMES DAILY WITH A MEAL.   Cholecalciferol 25 MCG (1000 UT) capsule Take 1 capsule (1,000 Units total) by mouth daily.   dicyclomine (BENTYL) 10 MG capsule Take 10 mg by mouth as needed for spasms.   esomeprazole (NEXIUM) 40 MG capsule Take 1 capsule (40 mg total) by mouth daily at 12 noon.   feeding supplement, ENSURE ENLIVE, (ENSURE ENLIVE) LIQD Take 237 mLs by mouth 2 (two) times daily between meals.   Insulin Degludec (TRESIBA [place]) Inject 16 Units into the skin at bedtime as needed.   insulin lispro (HUMALOG) 100 UNIT/ML injection Inject 5 Units into the skin 3 (three) times daily before meals. 5 units over 220 check and repeat  every 4 hours as needed   Multiple Vitamins-Minerals (CENTRUM SILVER 50+WOMEN PO) Take 1 tablet by mouth daily. 20 mg tablet   nitroGLYCERIN (NITROSTAT) 0.4 MG SL tablet 1 TABLET UNDER TONGUE AT ONSET OF CHEST PAIN YOU MAY REPEAT EVERY 5 MINUTES FOR UP TO 3 DOSES.   Omega-3 Fatty Acids (FISH OIL) 1200 MG CAPS Take 1 capsule by mouth daily at 12 noon.   ondansetron (ZOFRAN) 4 MG tablet Take 1 tablet (4 mg total) by mouth daily as needed for nausea or vomiting.   oxycodone (OXY-IR) 5 MG capsule Take 5 mg by mouth as needed.   predniSONE  (DELTASONE) 10 MG tablet Take 7.5 mg by mouth daily.    saccharomyces boulardii (FLORASTOR) 250 MG capsule Take 250 mg by mouth 2 (two) times daily.   Past Medical History:  Diagnosis Date   Bruit    Carotid Doppler showed no significant abnormality     9per patient)   C. difficile colitis 07/2018   with severe sepsis   Chest pain, unspecified    Nuclear, May, 2008, no scar or ischemia   Diabetes mellitus    Diverticulosis    Dyslipidemia    Ejection fraction    EF 55-60%, echo, February, 2011   GERD (gastroesophageal reflux disease)    Mitral regurgitation April 21, 2009   mild,  echo, February, 2011   Osteoporosis    Palpitations    possible very brief atrial fibrillation on monitor and possible reentrant tachycardia   Psoriasis    Rheumatoid arthritis ([place])    Past Surgical History:  Procedure Laterality Date   FRACTURE SURGERY     Social History   Social History Narrative   She is married to [place] and lives with him 2 children   Retired   No alcohol or drugs and never smoker   family history includes Diabetes in her father and mother; Heart attack in her father and mother; Heart failure in her father and mother; Stroke in her sister.   Review of Systems enbrel helping  Objective:   Physical Exam BP 122/62 (BP Location: Left Arm, Patient Position: Sitting, Cuff Size: Normal)    Pulse 79    Temp 97.9 F (36.6 C) (Oral)    Ht 5' (1.524 m)    Wt 128 lb (58.1 kg)    BMI 25.00 kg/m   Elderly white woman no acute distress a little frail improved from last visit  Rectal exam [name], [place] present.  Anoderm inspection revealed moderate erythema and small amount of adherent soft feces Digital exam revealed decreased resting tone and voluntary squeeze. No mass or rectocele present. Simulated defecation with valsalva revealed appropriate abdominal contraction but reduced descent.   Anoscopy gr 1 sl inflamed int hems

## 2018-12-12 NOTE — Assessment & Plan Note (Addendum)
Seems resolved as she is not having watery diarrhea or nocturnal stools or terrible cramps. ? Post IBS

## 2018-12-13 DIAGNOSIS — K6289 Other specified diseases of anus and rectum: Secondary | ICD-10-CM | POA: Insufficient documentation

## 2018-12-13 NOTE — Assessment & Plan Note (Signed)
Treat with colestipol.  Stop fish oil stop Florastor return to clinic in 6 weeks

## 2018-12-13 NOTE — Assessment & Plan Note (Signed)
May be age-appropriate certainly contributes to some of her problems.  Hold physical therapy as a potential option in reserve.  Work on loose stools first.

## 2018-12-13 NOTE — Assessment & Plan Note (Signed)
Undoubtably related to fecal incontinence and soiling.  Treat with calmoseptine hopefully we will get control of her diarrhea and incontinence.

## 2018-12-13 NOTE — Assessment & Plan Note (Addendum)
I think this may be why she lost her hair given her problems with severe C diff diarrhea

## 2018-12-16 DIAGNOSIS — H1045 Other chronic allergic conjunctivitis: Secondary | ICD-10-CM | POA: Diagnosis not present

## 2019-01-02 ENCOUNTER — Other Ambulatory Visit: Payer: Self-pay

## 2019-01-02 ENCOUNTER — Ambulatory Visit (HOSPITAL_COMMUNITY)
Admission: RE | Admit: 2019-01-02 | Discharge: 2019-01-02 | Disposition: A | Discharge status: Home / Self Care | Payer: Medicare Other | Source: Ambulatory Visit | Attending: Cardiovascular Disease | Admitting: Cardiovascular Disease

## 2019-01-02 DIAGNOSIS — I6523 Occlusion and stenosis of bilateral carotid arteries: Secondary | ICD-10-CM | POA: Insufficient documentation

## 2019-01-18 ENCOUNTER — Telehealth: Payer: Self-pay | Admitting: Internal Medicine

## 2019-01-18 DIAGNOSIS — A0471 Enterocolitis due to Clostridium difficile, recurrent: Secondary | ICD-10-CM

## 2019-01-18 MED ORDER — VANCOMYCIN HCL 125 MG PO CAPS: 125.0000 mg | ORAL_CAPSULE | Freq: Four times a day (QID) | ORAL | 0 refills | Status: AC

## 2019-01-18 NOTE — Telephone Encounter (Signed)
Recurrent nucous and loose stools x 48 hrs daughter [name] called Dr. Benson [place] and I called her when I saw messahe No fever suspected  Hx C diff recurrent   I told her we would treat and test again and I am anticipating using Zinplava  I have ordered:  1) CBC, CMET and stool C diff 2) Vancomycin 125 mg qid x 14 days  Please call daughter [name] so they get the Rx and labs done and I will f/u  She has 11/24 appt and should keep that  Discussed hydration

## 2019-01-19 ENCOUNTER — Other Ambulatory Visit (INDEPENDENT_AMBULATORY_CARE_PROVIDER_SITE_OTHER): Payer: Medicare Other

## 2019-01-19 DIAGNOSIS — A0471 Enterocolitis due to Clostridium difficile, recurrent: Secondary | ICD-10-CM | POA: Diagnosis not present

## 2019-01-19 LAB — COMPREHENSIVE METABOLIC PANEL
ALT: 10 U/L (ref 0–35)
AST: 11 U/L (ref 0–37)
Albumin: 3.6 g/dL (ref 3.5–5.2)
Alkaline Phosphatase: 42 U/L (ref 39–117)
BUN: 41 mg/dL — ABNORMAL HIGH (ref 6–23)
CO2: 24 mEq/L (ref 19–32)
Calcium: 8.3 mg/dL — ABNORMAL LOW (ref 8.4–10.5)
Chloride: 110 mEq/L (ref 96–112)
Creatinine, Ser: 1.27 mg/dL — ABNORMAL HIGH (ref 0.40–1.20)
GFR: 40.11 mL/min — ABNORMAL LOW (ref >60.00)
Glucose, Bld: 175 mg/dL — ABNORMAL HIGH (ref 70–99)
Potassium: 4 mEq/L (ref 3.5–5.1)
Sodium: 143 mEq/L (ref 135–145)
Total Bilirubin: 0.4 mg/dL (ref 0.2–1.2)
Total Protein: 6.1 g/dL (ref 6.0–8.3)

## 2019-01-19 LAB — CBC WITH DIFFERENTIAL/PLATELET
Basophils Absolute: 0.1 10*3/uL (ref 0.0–0.1)
Basophils Relative: 0.6 % (ref 0.0–3.0)
Eosinophils Absolute: 0.2 10*3/uL (ref 0.0–0.7)
Eosinophils Relative: 2.6 % (ref 0.0–5.0)
HCT: 29.7 % — ABNORMAL LOW (ref 36.0–46.0)
Hemoglobin: 9.9 g/dL — ABNORMAL LOW (ref 12.0–15.0)
Lymphocytes Relative: 22.9 % (ref 12.0–46.0)
Lymphs Abs: 2.1 10*3/uL (ref 0.7–4.0)
MCHC: 33.2 g/dL (ref 30.0–36.0)
MCV: 88.3 fl (ref 78.0–100.0)
Monocytes Absolute: 0.6 10*3/uL (ref 0.1–1.0)
Monocytes Relative: 6.5 % (ref 3.0–12.0)
Neutro Abs: 6.3 10*3/uL (ref 1.4–7.7)
Neutrophils Relative %: 67.4 % (ref 43.0–77.0)
Platelets: 213 10*3/uL (ref 150.0–400.0)
RBC: 3.37 Mil/uL — ABNORMAL LOW (ref 3.87–5.11)
RDW: 15.2 % (ref 11.5–15.5)
WBC: 9.3 10*3/uL (ref 4.0–10.5)

## 2019-01-19 NOTE — Telephone Encounter (Signed)
Patient notified.  She verbalized understanding to come for labs and pick up rx at the pharmacy

## 2019-01-20 ENCOUNTER — Other Ambulatory Visit: Payer: Medicare Other

## 2019-01-20 DIAGNOSIS — A0471 Enterocolitis due to Clostridium difficile, recurrent: Secondary | ICD-10-CM

## 2019-01-20 NOTE — Progress Notes (Signed)
Labs show mild dehydration Diarrhea a bit better per call to [name] - daughter  Starting vancomycin today  To submit stool test soon  Advised aggressive oral hydration and to call back prn - want to avoid ED and hospital if we can

## 2019-01-21 LAB — CLOSTRIDIUM DIFFICILE TOXIN B, QUALITATIVE, REAL-TIME PCR: Toxigenic C. Difficile by PCR: DETECTED — AB

## 2019-01-22 ENCOUNTER — Telehealth: Payer: Self-pay | Admitting: Internal Medicine

## 2019-01-22 NOTE — Telephone Encounter (Signed)
Patient reports that she is doing well.  She is reporting solid stool with mucus.  She reports that she is doing well.  No diarrhea.  She had 2 stools this am then nothing.  Main concern is return of mucus of the stool .  Daughter [name] would like call when we call back [phone]

## 2019-01-22 NOTE — Telephone Encounter (Signed)
Did you see the results from the c-diff.  Please review and advise

## 2019-01-22 NOTE — Telephone Encounter (Signed)
She does have + C diff test again  Please get a sx update and I will respond

## 2019-01-22 NOTE — Telephone Encounter (Signed)
Spoke to patient and daughter and reasured Will look into Zinplava

## 2019-01-23 ENCOUNTER — Other Ambulatory Visit: Payer: Self-pay

## 2019-01-23 DIAGNOSIS — A0471 Enterocolitis due to Clostridium difficile, recurrent: Secondary | ICD-10-CM

## 2019-01-23 NOTE — Progress Notes (Signed)
Dose of Zinplava is 10 mg/kg NOT 110 mg/kg Thank you

## 2019-01-23 NOTE — Progress Notes (Signed)
I called and spoke to daughter Need to see about Zinplava for this patient  110mg /kg IV x 1  It has been approved for short stay I know  Do not want to order yet but want to plan by contacting short stay to see

## 2019-01-26 DIAGNOSIS — E7849 Other hyperlipidemia: Secondary | ICD-10-CM | POA: Diagnosis not present

## 2019-01-26 DIAGNOSIS — E119 Type 2 diabetes mellitus without complications: Secondary | ICD-10-CM | POA: Diagnosis not present

## 2019-01-26 DIAGNOSIS — M810 Age-related osteoporosis without current pathological fracture: Secondary | ICD-10-CM | POA: Diagnosis not present

## 2019-01-27 ENCOUNTER — Ambulatory Visit (INDEPENDENT_AMBULATORY_CARE_PROVIDER_SITE_OTHER): Payer: Medicare Other | Admitting: Internal Medicine

## 2019-01-27 ENCOUNTER — Encounter: Payer: Self-pay | Admitting: Internal Medicine

## 2019-01-27 ENCOUNTER — Telehealth: Payer: Self-pay | Admitting: Internal Medicine

## 2019-01-27 DIAGNOSIS — K580 Irritable bowel syndrome with diarrhea: Secondary | ICD-10-CM

## 2019-01-27 DIAGNOSIS — A0471 Enterocolitis due to Clostridium difficile, recurrent: Secondary | ICD-10-CM | POA: Diagnosis not present

## 2019-01-27 DIAGNOSIS — I6523 Occlusion and stenosis of bilateral carotid arteries: Secondary | ICD-10-CM | POA: Diagnosis not present

## 2019-01-27 NOTE — Telephone Encounter (Signed)
Thanks  I am looking into this

## 2019-01-27 NOTE — Patient Instructions (Signed)
Please check on your drug formulary for the drug Dificid.    If you haven't heard from Dr Carlean Purl by Monday please call us. Thank you.    I appreciate the opportunity to care for you. Silvano Rusk, MD, [facility]

## 2019-01-27 NOTE — Assessment & Plan Note (Addendum)
Better on the vancomycin. ? Dificid if we can get some. Not sure Zinplava right medicine for her, she does not have heart failure but as we reviewed this again though it is an approved medication, there are cost issues which may be insurmountable, unless she can get patient assistance.  In addition I am not convinced the risk-benefit ratio is favorable overall and the recurrence prevention numbers though statistically significant were not that great.  ? taper vancomycin  We will regroup on Monday, November 30 by telephone  If fecal microbiota transplant were available we would have done this already but it is not available except in very rare circumstances I believe, in the setting of the COVID-19 pandemic  02/02/2019 Spoke to daughter and Shelby Walker is stable and ok  Our plan is for a 2 week vancomycin taper and then see if she can do Dificid - [name] will see if that is on her mom's formulary

## 2019-01-27 NOTE — Telephone Encounter (Signed)
The pt care center called to inform that they do not provide infusion for zinplava. They called the pt to inform about this and cancelled her appt.

## 2019-01-27 NOTE — Telephone Encounter (Signed)
Patient is coming in to see you today.  Patient care center let me schedule zinplava infusion, but called today to say they don't infuse this.  Is $4500 out of pocket.  Not covered by Medicare.  [facility] has paperwork for assistance program.  We may be able to try [place] if daughter wants to proceed after your visit today.

## 2019-01-27 NOTE — Progress Notes (Signed)
Shelby Walker 85 y.o. 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 ? Dificid if we can get some. Not sure Zinplava right medicine for her, she does not have heart failure but as we reviewed this again though it is an approved medication, there are cost issues which may be insurmountable, unless she can get patient assistance.  In addition I am not convinced the risk-benefit ratio is favorable overall and the recurrence prevention numbers though statistically significant were not that great.  ? taper vancomycin  We will regroup on Monday, November 30 by telephone  If fecal microbiota transplant were available we would have done this already but it is not available except in very rare circumstances I believe, in the setting of the COVID-19 pandemic  02/02/2019 Spoke to daughter and [name] is stable and ok  Our plan is for a 2 week vancomycin taper and then see if she can do Dificid - [name] will see if that is on her mom's formulary  Irritable bowel syndrome with diarrhea - post infectious Based upon the clinical scenario I am doubtful she has this now though always have to keep that in mind with symptoms post C. difficile colitis  I appreciate the opportunity to care for this patient. CC: Reynold Bowen, MD    Subjective:   Chief Complaint: Recurrent C. difficile  HPI The patient is here with her daughter [name], she had called last week and started having some mucoid stools and watery diarrhea numerous.  She thought it was probably a C. difficile flare.  I restarted vancomycin to 50 mg 4 times daily and she has had a fairly rapid improvement though is still having some mucus.  Repeat testing was positive for C. difficile PCR so we think this is a recurrence.  It is her third or fourth recurrence.  She is here to discuss possible biologic treatment.  And to reassess.  No fevers reported.  Wt Readings from  Last 3 Encounters:  01/27/19 133 lb (60.3 kg)  12/12/18 128 lb (58.1 kg)  10/21/18 120 lb (54.4 kg)    Allergies  Allergen Reactions   Compazine [Prochlorperazine Edisylate] Other (See Comments)    REACTION: " tongue swell and unable to swallow"   Sulfonamide Derivatives Other (See Comments)    REACTION: " broke out with fine itching bumps"   Cholestyramine     Possible- Makes throat burn. Patient said she can't take it    Current Meds  Medication Sig   acetaminophen (TYLENOL) 325 MG tablet Take 2 tablets (650 mg total) by mouth every 6 (six) hours as needed for mild pain (or Fever >/= 101).   apixaban (ELIQUIS) 2.5 MG TABS tablet Take 1 tablet by mouth 2 times daily   atorvastatin (LIPITOR) 20 MG tablet Take 1 tablet (20 mg total) by mouth daily.   Calcium Carbonate-Vitamin D (CALTRATE 600+D) 600-400 MG-UNIT per tablet Take 1 tablet by mouth daily at 12 noon.    carvedilol (COREG) 3.125 MG tablet TAKE 1 TABLET (3.125 MG TOTAL) BY MOUTH 2 (TWO) TIMES DAILY WITH A MEAL.   Cholecalciferol 25 MCG (1000 UT) capsule Take 1 capsule (1,000 Units total) by mouth daily.   colestipol (COLESTID) 5 g granules Take 5 g by mouth daily with supper.   dicyclomine (BENTYL) 10 MG capsule Take 10 mg by mouth as needed for spasms.   esomeprazole (NEXIUM) 40 MG capsule Take 1 capsule (40 mg  total) by mouth daily at 12 noon.   feeding supplement, ENSURE ENLIVE, (ENSURE ENLIVE) LIQD Take 237 mLs by mouth 2 (two) times daily between meals.   Insulin Degludec (TRESIBA [place]) Inject 16 Units into the skin at bedtime as needed.   insulin lispro (HUMALOG) 100 UNIT/ML injection Inject 5 Units into the skin 3 (three) times daily before meals. 5 units over 220 check and repeat every 4 hours as needed   Multiple Vitamins-Minerals (CENTRUM SILVER 50+WOMEN PO) Take 1 tablet by mouth daily. 20 mg tablet   nitroGLYCERIN (NITROSTAT) 0.4 MG SL tablet 1 TABLET UNDER TONGUE AT ONSET OF CHEST PAIN YOU MAY REPEAT  EVERY 5 MINUTES FOR UP TO 3 DOSES.   Omega-3 Fatty Acids (FISH OIL) 1200 MG CAPS Take 1 capsule by mouth daily at 12 noon.   ondansetron (ZOFRAN) 4 MG tablet Take 1 tablet (4 mg total) by mouth daily as needed for nausea or vomiting.   oxycodone (OXY-IR) 5 MG capsule Take 5 mg by mouth as needed.   predniSONE (DELTASONE) 10 MG tablet Take 7.5 mg by mouth daily.    [EXPIRED] vancomycin (VANCOCIN) 125 MG capsule Take 1 capsule (125 mg total) by mouth 4 (four) times daily for 14 days.   Past Medical History:  Diagnosis Date   Bruit    Carotid Doppler showed no significant abnormality     9per patient)   C. difficile colitis 07/2018   with severe sepsis   Chest pain, unspecified    Nuclear, May, 2008, no scar or ischemia   Diabetes mellitus    Diverticulosis    Dyslipidemia    Ejection fraction    EF 55-60%, echo, February, 2011   GERD (gastroesophageal reflux disease)    Mitral regurgitation April 21, 2009   mild,  echo, February, 2011   Osteoporosis    Palpitations    possible very brief atrial fibrillation on monitor and possible reentrant tachycardia   Psoriasis    Rheumatoid arthritis ([place])    Past Surgical History:  Procedure Laterality Date   FRACTURE SURGERY     Social History   Social History Narrative   She is married to [place] and lives with him 2 children   Retired   No alcohol or drugs and never smoker   family history includes Diabetes in her father and mother; Heart attack in her father and mother; Heart failure in her father and mother; Stroke in her sister.   Review of Systems Rheumatoid arthritis is improved since she started Enbrel again  Objective:   Physical Exam  BP 120/60    Pulse 82    Temp (!) 97.4 F (36.3 C)    Ht 5\' 1"  (1.549 m)    Wt 133 lb (60.3 kg)    BMI 25.13 kg/m  Petite but well-developed and well-nourished elderly white woman no acute distress

## 2019-01-29 NOTE — Assessment & Plan Note (Signed)
Based upon the clinical scenario I am doubtful she has this now though always have to keep that in mind with symptoms post C. difficile colitis

## 2019-02-02 DIAGNOSIS — I4891 Unspecified atrial fibrillation: Secondary | ICD-10-CM | POA: Diagnosis not present

## 2019-02-02 DIAGNOSIS — E1129 Type 2 diabetes mellitus with other diabetic kidney complication: Secondary | ICD-10-CM | POA: Diagnosis not present

## 2019-02-02 DIAGNOSIS — Z1331 Encounter for screening for depression: Secondary | ICD-10-CM | POA: Diagnosis not present

## 2019-02-02 DIAGNOSIS — N1832 Chronic kidney disease, stage 3b: Secondary | ICD-10-CM | POA: Diagnosis not present

## 2019-02-02 DIAGNOSIS — K829 Disease of gallbladder, unspecified: Secondary | ICD-10-CM | POA: Diagnosis not present

## 2019-02-02 DIAGNOSIS — Z0000 Encounter for general adult medical examination without abnormal findings: Secondary | ICD-10-CM | POA: Diagnosis not present

## 2019-02-02 DIAGNOSIS — R0989 Other specified symptoms and signs involving the circulatory and respiratory systems: Secondary | ICD-10-CM | POA: Diagnosis not present

## 2019-02-02 DIAGNOSIS — E785 Hyperlipidemia, unspecified: Secondary | ICD-10-CM | POA: Diagnosis not present

## 2019-02-02 DIAGNOSIS — M069 Rheumatoid arthritis, unspecified: Secondary | ICD-10-CM | POA: Diagnosis not present

## 2019-02-02 DIAGNOSIS — I700 Atherosclerosis of aorta: Secondary | ICD-10-CM | POA: Diagnosis not present

## 2019-02-02 DIAGNOSIS — I129 Hypertensive chronic kidney disease with stage 1 through stage 4 chronic kidney disease, or unspecified chronic kidney disease: Secondary | ICD-10-CM | POA: Diagnosis not present

## 2019-02-02 DIAGNOSIS — A0472 Enterocolitis due to Clostridium difficile, not specified as recurrent: Secondary | ICD-10-CM | POA: Diagnosis not present

## 2019-02-02 DIAGNOSIS — I5189 Other ill-defined heart diseases: Secondary | ICD-10-CM | POA: Diagnosis not present

## 2019-02-02 MED ORDER — VANCOMYCIN HCL 125 MG PO CAPS: ORAL_CAPSULE | ORAL | 0 refills | Status: AC

## 2019-02-11 ENCOUNTER — Ambulatory Visit (HOSPITAL_COMMUNITY): Payer: Medicare Other

## 2019-03-12 ENCOUNTER — Telehealth: Payer: Self-pay | Admitting: Cardiovascular Disease

## 2019-03-12 DIAGNOSIS — I129 Hypertensive chronic kidney disease with stage 1 through stage 4 chronic kidney disease, or unspecified chronic kidney disease: Secondary | ICD-10-CM | POA: Diagnosis not present

## 2019-03-12 DIAGNOSIS — Z794 Long term (current) use of insulin: Secondary | ICD-10-CM | POA: Diagnosis not present

## 2019-03-12 DIAGNOSIS — N1832 Chronic kidney disease, stage 3b: Secondary | ICD-10-CM | POA: Diagnosis not present

## 2019-03-12 DIAGNOSIS — E1129 Type 2 diabetes mellitus with other diabetic kidney complication: Secondary | ICD-10-CM | POA: Diagnosis not present

## 2019-03-12 NOTE — Telephone Encounter (Signed)
Returned call to patient's daughter, [name], regarding patient's c/o pain in shoulder. [name] states patient reports pain that disturbs her sleep and is different from her pain from RA. [name] states patient saw a commercial about Eliquis and wonders if this is the cause of her pain. I advised that I have reviewed the medications and asked about the colestipol prescribed by Dr. Carlean Purl. Patient also takes atorvastatin 20 mg daily.   [name] reports patient has not taken colestipol for several months. I advised that most recent lab work from Dr. Baldwin Crown office reveals LDL < 70 and that patient can try holding her atorvastatin for 3 weeks to see if pain improves. I advised her to call back to report and based on her response, we will come up with a plan for medication management. Questions were answered to her satisfaction and [name] verbalized understanding and agreement and thanked me for the call.

## 2019-03-12 NOTE — Telephone Encounter (Signed)
New Message     Pt c/o medication issue:  1. Name of Medication: apixaban (ELIQUIS) 2.5 MG TABS tablet  2. How are you currently taking this medication (dosage and times per day)? 1 tablet 2x daily   3. Are you having a reaction (difficulty breathing--STAT)? No   4. What is your medication issue? Pts daughter is calling and says the Pt says she is having joint pain in both shoulders, more the right side. She doesn't normally have joint pain and it is really hurting her.  She thinks the joint pain can be from the Eliquis  Could she stop taking or lower the dosage? Or switch to another medication?   Please call

## 2019-03-13 ENCOUNTER — Telehealth: Payer: Self-pay | Admitting: Internal Medicine

## 2019-03-13 NOTE — Telephone Encounter (Signed)
Okay with getting the COVID vaccine with recurrent c-diff?

## 2019-03-13 NOTE — Telephone Encounter (Signed)
Patient's daughter [name] notified

## 2019-03-13 NOTE — Telephone Encounter (Signed)
Absolutely ok to get Covid vaccine

## 2019-03-19 ENCOUNTER — Other Ambulatory Visit: Payer: Self-pay

## 2019-03-19 ENCOUNTER — Ambulatory Visit (INDEPENDENT_AMBULATORY_CARE_PROVIDER_SITE_OTHER): Payer: Medicare Other | Admitting: Cardiovascular Disease

## 2019-03-19 ENCOUNTER — Encounter: Payer: Self-pay | Admitting: Cardiovascular Disease

## 2019-03-19 VITALS — BP 142/56 | HR 87 | Ht 61.0 in | Wt 131.0 lb

## 2019-03-19 DIAGNOSIS — I480 Paroxysmal atrial fibrillation: Secondary | ICD-10-CM

## 2019-03-19 DIAGNOSIS — I10 Essential (primary) hypertension: Secondary | ICD-10-CM | POA: Diagnosis not present

## 2019-03-19 DIAGNOSIS — I6529 Occlusion and stenosis of unspecified carotid artery: Secondary | ICD-10-CM

## 2019-03-19 NOTE — Patient Instructions (Signed)
Medication Instructions:  Your physician recommends that you continue on your current medications as directed. Please refer to the Current Medication list given to you today.  *If you need a refill on your cardiac medications before your next appointment, please call your pharmacy*  Lab Work: None Ordered If you have labs (blood work) drawn today and your tests are completely normal, you will receive your results only by: Marland Kitchen MyChart Message (if you have MyChart) OR . A paper copy in the mail If you have any lab test that is abnormal or we need to change your treatment, we will call you to review the results.   Testing/Procedures: None Ordered   Follow-Up: At [facility], you and your health needs are our priority.  As part of our continuing mission to provide you with exceptional heart care, we have created designated Provider Care Teams.  These Care Teams include your primary Cardiologist (physician) and Advanced Practice Providers (APPs -  Physician Assistants and Nurse Practitioners) who all work together to provide you with the care you need, when you need it.  Your next appointment:   6 month(s)  The format for your next appointment:   Either In Person or Virtual  Provider:   Richardson Dopp, PA-C, Robbie Lis, PA-C or Daune Perch, NP

## 2019-03-19 NOTE — Progress Notes (Signed)
Cardiology Office Note   Date:  03/19/2019   ID:  Shelby Walker, DOB 24-Apr-1933, MRN [id number]  PCP:  Reynold Bowen, MD  Cardiologist:  Mertie Moores, MD  ( transfer from Dr. Ron Parker)   Chief Complaint  Patient presents with  . PAD    carotid artery disease    follow-up palpitations and some chest discomfort in the past     Shelby Walker is a 86 y.o. female who presents today to follow-up a history of mild chest discomfort and palpitations in the past. She is feeling well and not having any significant symptoms.  Recently she had a mild case of shingles. She also had an upper respiratory infection that is being treated and improving.  Dec 09, 2015:  Shelby Walker is seen today for the first time - transfer from Ron Parker Followed for palpitations with some associated chest pain  Stays active,  Does yard work.   These can last as long as 30 minutes .   No CP or palpitations with yard work . Typically occur at night  The CP starts first and then she has the palpitations  No recent myoview No recent monitor   August 09, 2016:  Shelby Walker is seen for follow up of her carotid disease  She had been having some chest pain . Myoview was low risk.  Still very active,  No further CP .   Has moderate carotid disease Has some burning in her legs with walking and at night   May 01, 2017.  Shelby Walker  is seen today for follow-up of her claudication and episodes of chest pain.3 She is getting over pneumonia .   Leg pain / claudication  has improved.   Has seen Dr. Saunders Revel for PAD .  She was found to have noncritical peripheral arterial disease.  She has not wanted to have any procedure and at this point she has not needed it. Dr. Forde Dandy follows / manages her lipids and diabetes.  Will request labs from recent visit   Oct. 2, 2019:  Shelby Walker is seen today .  Seen with daughter , [name]  Had an episode of syncope several weeks ago ( was very hot , standing for 45 min at a funeral )  Had a mild episode of  lightheadeeness yesterday  Admits that she does eat well or drink enough Associated with a fast HR ( felt that her heart was pounding )  Has mild carotid disease. Scheduled to have repeat carotid duplex later this month  Jan. 14, 2021 Shelby Walker is seen today for follow up of her hyperlipidemia, diabetes mellitus and chest pain and PAF  Her husband passed away this past 06-12-22  No CP or dyspnea.  No syncope  Has been found to have atrial fib and is on eliquis   Past Medical History:  Diagnosis Date  . Bruit    Carotid Doppler showed no significant abnormality     9per patient)  . C. difficile colitis 07/2018   with severe sepsis  . Chest pain, unspecified    Nuclear, May, 2008, no scar or ischemia  . Diabetes mellitus   . Diverticulosis   . Dyslipidemia   . Ejection fraction    EF 55-60%, echo, February, 2011  . GERD (gastroesophageal reflux disease)   . Mitral regurgitation April 21, 2009   mild,  echo, February, 2011  . Osteoporosis   . Palpitations    possible very brief atrial fibrillation on monitor and possible reentrant  tachycardia  . Psoriasis   . Rheumatoid arthritis [facility])     Past Surgical History:  Procedure Laterality Date  . FRACTURE SURGERY      Patient Active Problem List   Diagnosis Date Noted  . PAF (paroxysmal atrial fibrillation) (HCC)     Priority: High  . Decreased anal sphincter tone 12/13/2018  . 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  . Irritable bowel syndrome with diarrhea - post infectious 12/12/2018  . Full incontinence of feces and fecal smearing 12/12/2018  . Perianal dermatitis 12/12/2018  . Uncontrolled type 2 diabetes mellitus with hyperglycemia ([place]) 10/26/2018  . Long-term use of immunosuppressant medication-Enbrel 10/26/2018  . 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 Thrombocytopenia ([place])   . Essential hypertension   . Shortness of breath   . Steroid-induced hyperglycemia   . Mixed hyperlipidemia 09/29/2016   . Stable angina ([place]) 09/29/2016  . Claudication in peripheral vascular disease ([place]) 08/09/2016  . Preoperative clearance 05/21/2012  . Precordial chest pain   . Palpitations   . Diabetes mellitus ([place])   . GERD (gastroesophageal reflux disease)   . Ejection fraction   . Carotid artery disease ([place])   . Mitral regurgitation 04/21/2009  . PSORIASIS 02/09/2009  . Rheumatoid arthritis flare ([place]) 02/09/2009      Current Outpatient Medications  Medication Sig Dispense Refill  . acetaminophen (TYLENOL) 325 MG tablet Take 2 tablets (650 mg total) by mouth every 6 (six) hours as needed for mild pain (or Fever >/= 101).    Marland Kitchen apixaban (ELIQUIS) 2.5 MG TABS tablet Take 1 tablet by mouth 2 times daily 60 tablet 5  . atorvastatin (LIPITOR) 20 MG tablet Take 1 tablet (20 mg total) by mouth daily. 90 tablet 3  . Calcium Carbonate-Vitamin D (CALTRATE 600+D) 600-400 MG-UNIT per tablet Take 1 tablet by mouth daily at 12 noon.     . carvedilol (COREG) 3.125 MG tablet TAKE 1 TABLET (3.125 MG TOTAL) BY MOUTH 2 (TWO) TIMES DAILY WITH A MEAL. 60 tablet 11  . Cholecalciferol 25 MCG (1000 UT) capsule Take 1 capsule (1,000 Units total) by mouth daily. 30 capsule 0  . colestipol (COLESTID) 5 g granules Take 5 g by mouth daily with supper. (Patient not taking: Reported on 03/12/2019) 500 g 1  . dicyclomine (BENTYL) 10 MG capsule Take 10 mg by mouth as needed for spasms.    Marland Kitchen esomeprazole (NEXIUM) 40 MG capsule Take 1 capsule (40 mg total) by mouth daily at 12 noon. 30 capsule 0  . feeding supplement, ENSURE ENLIVE, (ENSURE ENLIVE) LIQD Take 237 mLs by mouth 2 (two) times daily between meals. 237 mL 12  . Insulin Degludec (TRESIBA [place]) Inject 16 Units into the skin at bedtime as needed.    . insulin lispro (HUMALOG) 100 UNIT/ML injection Inject 5 Units into the skin 3 (three) times daily before meals. 5 units over 220 check and repeat every 4 hours as needed    . Multiple Vitamins-Minerals (CENTRUM SILVER 50+WOMEN  PO) Take 1 tablet by mouth daily. 20 mg tablet    . nitroGLYCERIN (NITROSTAT) 0.4 MG SL tablet 1 TABLET UNDER TONGUE AT ONSET OF CHEST PAIN YOU MAY REPEAT EVERY 5 MINUTES FOR UP TO 3 DOSES. 25 tablet 3  . Omega-3 Fatty Acids (FISH OIL) 1200 MG CAPS Take 1 capsule by mouth daily at 12 noon.    . ondansetron (ZOFRAN) 4 MG tablet Take 1 tablet (4 mg total) by mouth daily as needed for nausea or vomiting. 30 tablet  1  . oxycodone (OXY-IR) 5 MG capsule Take 5 mg by mouth as needed.    . predniSONE (DELTASONE) 10 MG tablet Take 7.5 mg by mouth daily.   3   No current facility-administered medications for this visit.    Allergies:   Compazine [prochlorperazine edisylate], Sulfonamide derivatives, and Cholestyramine    Social History:  The patient  reports that she has never smoked. She has never used smokeless tobacco. She reports that she does not drink alcohol or use drugs.   Family History:  The patient's family history includes Diabetes in her father and mother; Heart attack in her father and mother; Heart failure in her father and mother; Stroke in her sister.    ROS: As per current history.  Other systems are negative.  Physical Exam: Blood pressure (!) 142/56, pulse 87, height 5\' 1"  (1.549 m), weight 131 lb (59.4 kg), SpO2 96 %.  GEN:  Well nourished, well developed in no acute distress HEENT: Normal NECK: No JVD; soft Left  Carotid bruit LYMPHATICS: No lymphadenopathy CARDIAC: RRR  , soft systolic murmur  RESPIRATORY:  Clear to auscultation without rales, wheezing or rhonchi  ABDOMEN: Soft, non-tender, non-distended MUSCULOSKELETAL:  No edema; No deformity  SKIN: Warm and dry NEUROLOGIC:  Alert and oriented x 3   EKG: December 04, 2017: Normal sinus rhythm at 76.  Normal EKG.  Recent Labs: 07/24/2018: TSH 1.562 08/13/2018: Magnesium 2.2 01/19/2019: ALT 10; BUN 41; Creatinine, Ser 1.27; Hemoglobin 9.9; Platelets 213.0; Potassium 4.0; Sodium 143    Lipid Panel    Component  Value Date/Time   CHOL 129 12/24/2016 1131   TRIG 151 (H) 12/24/2016 1131   HDL 52 12/24/2016 1131   CHOLHDL 2.5 12/24/2016 1131   LDLCALC 47 12/24/2016 1131      Wt Readings from Last 3 Encounters:  01/27/19 133 lb (60.3 kg)  12/12/18 128 lb (58.1 kg)  10/21/18 120 lb (54.4 kg)      Current medicines are reviewed  The patient understands her medications well.     ASSESSMENT AND PLAN:  1. Atrial fib :  Paroxysmal .   Noticed when she was hospitalized for C-diff colitis. On eliquis ,  Is in NSR today   2. Left carotid bruit: -    Soft bruit.  She has mild bilateral carotid artery disease.  We will continue to get carotid duplex scans every 2 years or so..   3. Claudication:   No issues with claudication currently     Mertie Moores, MD  03/19/2019 8:36 AM    [address],  [place], [place]  [address] Pager [phone] Phone: [phone]; Fax: [phone]

## 2019-03-23 DIAGNOSIS — Z79899 Other long term (current) drug therapy: Secondary | ICD-10-CM | POA: Diagnosis not present

## 2019-03-23 DIAGNOSIS — M2550 Pain in unspecified joint: Secondary | ICD-10-CM | POA: Diagnosis not present

## 2019-03-23 DIAGNOSIS — M25562 Pain in left knee: Secondary | ICD-10-CM | POA: Diagnosis not present

## 2019-03-23 DIAGNOSIS — E663 Overweight: Secondary | ICD-10-CM | POA: Diagnosis not present

## 2019-03-23 DIAGNOSIS — M810 Age-related osteoporosis without current pathological fracture: Secondary | ICD-10-CM | POA: Diagnosis not present

## 2019-03-23 DIAGNOSIS — Z6824 Body mass index (BMI) 24.0-24.9, adult: Secondary | ICD-10-CM | POA: Diagnosis not present

## 2019-03-23 DIAGNOSIS — M25511 Pain in right shoulder: Secondary | ICD-10-CM | POA: Diagnosis not present

## 2019-03-23 DIAGNOSIS — M069 Rheumatoid arthritis, unspecified: Secondary | ICD-10-CM | POA: Diagnosis not present

## 2019-03-23 DIAGNOSIS — M150 Primary generalized (osteo)arthritis: Secondary | ICD-10-CM | POA: Diagnosis not present

## 2019-03-23 DIAGNOSIS — L409 Psoriasis, unspecified: Secondary | ICD-10-CM | POA: Diagnosis not present

## 2019-03-23 DIAGNOSIS — F4321 Adjustment disorder with depressed mood: Secondary | ICD-10-CM | POA: Diagnosis not present

## 2019-03-26 DIAGNOSIS — R55 Syncope and collapse: Secondary | ICD-10-CM | POA: Diagnosis not present

## 2019-03-26 DIAGNOSIS — R0789 Other chest pain: Secondary | ICD-10-CM | POA: Diagnosis not present

## 2019-03-26 DIAGNOSIS — R079 Chest pain, unspecified: Secondary | ICD-10-CM | POA: Diagnosis not present

## 2019-03-26 DIAGNOSIS — I959 Hypotension, unspecified: Secondary | ICD-10-CM | POA: Diagnosis not present

## 2019-03-27 DIAGNOSIS — I4891 Unspecified atrial fibrillation: Secondary | ICD-10-CM | POA: Diagnosis not present

## 2019-03-27 DIAGNOSIS — I130 Hypertensive heart and chronic kidney disease with heart failure and stage 1 through stage 4 chronic kidney disease, or unspecified chronic kidney disease: Secondary | ICD-10-CM | POA: Diagnosis not present

## 2019-03-27 DIAGNOSIS — F4320 Adjustment disorder, unspecified: Secondary | ICD-10-CM | POA: Diagnosis not present

## 2019-03-27 DIAGNOSIS — N1832 Chronic kidney disease, stage 3b: Secondary | ICD-10-CM | POA: Diagnosis not present

## 2019-03-27 DIAGNOSIS — R000 Tachycardia, unspecified: Secondary | ICD-10-CM | POA: Diagnosis not present

## 2019-03-27 DIAGNOSIS — I5189 Other ill-defined heart diseases: Secondary | ICD-10-CM | POA: Diagnosis not present

## 2019-03-27 DIAGNOSIS — E1129 Type 2 diabetes mellitus with other diabetic kidney complication: Secondary | ICD-10-CM | POA: Diagnosis not present

## 2019-05-18 ENCOUNTER — Other Ambulatory Visit: Payer: Self-pay

## 2019-05-18 MED ORDER — APIXABAN 2.5 MG PO TABS: ORAL_TABLET | ORAL | 5 refills | Status: DC

## 2019-05-18 NOTE — Telephone Encounter (Signed)
Prescription refill request for Eliquis received.  Last office visit: [place], 03/19/2019 Scr: 01/19/2019, 1.27 Age: 86 y.o. Weight: 59.4 kg   Prescription refill sent.

## 2019-05-20 DIAGNOSIS — H16223 Keratoconjunctivitis sicca, not specified as Sjogren's, bilateral: Secondary | ICD-10-CM | POA: Diagnosis not present

## 2019-06-10 DIAGNOSIS — I700 Atherosclerosis of aorta: Secondary | ICD-10-CM | POA: Diagnosis not present

## 2019-06-10 DIAGNOSIS — E559 Vitamin D deficiency, unspecified: Secondary | ICD-10-CM | POA: Diagnosis not present

## 2019-06-10 DIAGNOSIS — I4891 Unspecified atrial fibrillation: Secondary | ICD-10-CM | POA: Diagnosis not present

## 2019-06-10 DIAGNOSIS — K760 Fatty (change of) liver, not elsewhere classified: Secondary | ICD-10-CM | POA: Diagnosis not present

## 2019-06-10 DIAGNOSIS — Z794 Long term (current) use of insulin: Secondary | ICD-10-CM | POA: Diagnosis not present

## 2019-06-10 DIAGNOSIS — I5189 Other ill-defined heart diseases: Secondary | ICD-10-CM | POA: Diagnosis not present

## 2019-06-10 DIAGNOSIS — I480 Paroxysmal atrial fibrillation: Secondary | ICD-10-CM | POA: Diagnosis not present

## 2019-06-10 DIAGNOSIS — E1129 Type 2 diabetes mellitus with other diabetic kidney complication: Secondary | ICD-10-CM | POA: Diagnosis not present

## 2019-06-10 DIAGNOSIS — I739 Peripheral vascular disease, unspecified: Secondary | ICD-10-CM | POA: Diagnosis not present

## 2019-06-10 DIAGNOSIS — A0472 Enterocolitis due to Clostridium difficile, not specified as recurrent: Secondary | ICD-10-CM | POA: Diagnosis not present

## 2019-06-10 DIAGNOSIS — N1832 Chronic kidney disease, stage 3b: Secondary | ICD-10-CM | POA: Diagnosis not present

## 2019-06-10 DIAGNOSIS — F4320 Adjustment disorder, unspecified: Secondary | ICD-10-CM | POA: Diagnosis not present

## 2019-07-01 DIAGNOSIS — M150 Primary generalized (osteo)arthritis: Secondary | ICD-10-CM | POA: Diagnosis not present

## 2019-07-01 DIAGNOSIS — E663 Overweight: Secondary | ICD-10-CM | POA: Diagnosis not present

## 2019-07-01 DIAGNOSIS — Z79899 Other long term (current) drug therapy: Secondary | ICD-10-CM | POA: Diagnosis not present

## 2019-07-01 DIAGNOSIS — M25562 Pain in left knee: Secondary | ICD-10-CM | POA: Diagnosis not present

## 2019-07-01 DIAGNOSIS — R600 Localized edema: Secondary | ICD-10-CM | POA: Diagnosis not present

## 2019-07-01 DIAGNOSIS — L409 Psoriasis, unspecified: Secondary | ICD-10-CM | POA: Diagnosis not present

## 2019-07-01 DIAGNOSIS — M810 Age-related osteoporosis without current pathological fracture: Secondary | ICD-10-CM | POA: Diagnosis not present

## 2019-07-01 DIAGNOSIS — M25511 Pain in right shoulder: Secondary | ICD-10-CM | POA: Diagnosis not present

## 2019-07-01 DIAGNOSIS — F4321 Adjustment disorder with depressed mood: Secondary | ICD-10-CM | POA: Diagnosis not present

## 2019-07-01 DIAGNOSIS — Z6825 Body mass index (BMI) 25.0-25.9, adult: Secondary | ICD-10-CM | POA: Diagnosis not present

## 2019-07-01 DIAGNOSIS — M069 Rheumatoid arthritis, unspecified: Secondary | ICD-10-CM | POA: Diagnosis not present

## 2019-07-01 DIAGNOSIS — M2550 Pain in unspecified joint: Secondary | ICD-10-CM | POA: Diagnosis not present

## 2019-07-14 DIAGNOSIS — Z794 Long term (current) use of insulin: Secondary | ICD-10-CM | POA: Diagnosis not present

## 2019-07-14 DIAGNOSIS — I130 Hypertensive heart and chronic kidney disease with heart failure and stage 1 through stage 4 chronic kidney disease, or unspecified chronic kidney disease: Secondary | ICD-10-CM | POA: Diagnosis not present

## 2019-07-14 DIAGNOSIS — E1129 Type 2 diabetes mellitus with other diabetic kidney complication: Secondary | ICD-10-CM | POA: Diagnosis not present

## 2019-07-14 DIAGNOSIS — N1832 Chronic kidney disease, stage 3b: Secondary | ICD-10-CM | POA: Diagnosis not present

## 2019-07-30 DIAGNOSIS — E7849 Other hyperlipidemia: Secondary | ICD-10-CM | POA: Diagnosis not present

## 2019-07-30 DIAGNOSIS — F4320 Adjustment disorder, unspecified: Secondary | ICD-10-CM | POA: Diagnosis not present

## 2019-07-30 DIAGNOSIS — D649 Anemia, unspecified: Secondary | ICD-10-CM | POA: Diagnosis not present

## 2019-07-30 DIAGNOSIS — E1129 Type 2 diabetes mellitus with other diabetic kidney complication: Secondary | ICD-10-CM | POA: Diagnosis not present

## 2019-07-30 DIAGNOSIS — I739 Peripheral vascular disease, unspecified: Secondary | ICD-10-CM | POA: Diagnosis not present

## 2019-07-30 DIAGNOSIS — M069 Rheumatoid arthritis, unspecified: Secondary | ICD-10-CM | POA: Diagnosis not present

## 2019-07-30 DIAGNOSIS — E559 Vitamin D deficiency, unspecified: Secondary | ICD-10-CM | POA: Diagnosis not present

## 2019-07-30 DIAGNOSIS — N1832 Chronic kidney disease, stage 3b: Secondary | ICD-10-CM | POA: Diagnosis not present

## 2019-07-30 DIAGNOSIS — I5189 Other ill-defined heart diseases: Secondary | ICD-10-CM | POA: Diagnosis not present

## 2019-07-30 DIAGNOSIS — K760 Fatty (change of) liver, not elsewhere classified: Secondary | ICD-10-CM | POA: Diagnosis not present

## 2019-07-30 DIAGNOSIS — I480 Paroxysmal atrial fibrillation: Secondary | ICD-10-CM | POA: Diagnosis not present

## 2019-07-30 DIAGNOSIS — I700 Atherosclerosis of aorta: Secondary | ICD-10-CM | POA: Diagnosis not present

## 2019-09-09 ENCOUNTER — Other Ambulatory Visit: Payer: Self-pay

## 2019-09-09 ENCOUNTER — Encounter: Payer: Self-pay | Admitting: Physician Assistant

## 2019-09-09 ENCOUNTER — Ambulatory Visit (INDEPENDENT_AMBULATORY_CARE_PROVIDER_SITE_OTHER): Payer: Medicare Other | Admitting: Physician Assistant

## 2019-09-09 VITALS — BP 110/52 | HR 87 | Ht 61.0 in | Wt 137.6 lb

## 2019-09-09 DIAGNOSIS — I6523 Occlusion and stenosis of bilateral carotid arteries: Secondary | ICD-10-CM | POA: Diagnosis not present

## 2019-09-09 DIAGNOSIS — I480 Paroxysmal atrial fibrillation: Secondary | ICD-10-CM | POA: Diagnosis not present

## 2019-09-09 DIAGNOSIS — I10 Essential (primary) hypertension: Secondary | ICD-10-CM

## 2019-09-09 DIAGNOSIS — R0602 Shortness of breath: Secondary | ICD-10-CM | POA: Diagnosis not present

## 2019-09-09 DIAGNOSIS — I739 Peripheral vascular disease, unspecified: Secondary | ICD-10-CM

## 2019-09-09 DIAGNOSIS — I700 Atherosclerosis of aorta: Secondary | ICD-10-CM

## 2019-09-09 DIAGNOSIS — E782 Mixed hyperlipidemia: Secondary | ICD-10-CM

## 2019-09-09 MED ORDER — FUROSEMIDE 20 MG PO TABS
ORAL_TABLET | ORAL | 1 refills | Status: DC
Start: 2019-09-09 — End: 2019-10-21

## 2019-09-09 NOTE — Patient Instructions (Addendum)
Medication Instructions:   Your physician has recommended you make the following change in your medication:   1) Start Furosemide 20 mg, 1 tablet by mouth for 3 days then stop  *If you need a refill on your cardiac medications before your next appointment, please call your pharmacy*  Lab Work:  You will have labs drawn today: BMET/BNP/CBC  If you have labs (blood work) drawn today and your tests are completely normal, you will receive your results only by:  [place] (if you have MyChart) OR  A paper copy in the mail If you have any lab test that is abnormal or we need to change your treatment, we will call you to review the results.  Testing/Procedures:  Your physician has requested that you have an echocardiogram. Echocardiography is a painless test that uses sound waves to create images of your heart. It provides your doctor with information about the size and shape of your heart and how well your hearts chambers and valves are working. This procedure takes approximately one hour. There are no restrictions for this procedure.  Your physician has requested that you have a lexiscan myoview after the echocardiogram. For further information please visit [email or web address]. Please follow instruction sheet, as given.  Your physician has requested that you have an ankle brachial index (ABI). During this test an ultrasound and blood pressure cuff are used to evaluate the arteries that supply the arms and legs with blood. Allow thirty minutes for this exam. There are no restrictions or special instructions.  Your physician has requested that you have a lower extremity arterial duplex. This test is an ultrasound of the arteries in the legs. It looks at arterial blood flow in the legs. Allow one hour for Lower and Upper Arterial scans. There are no restrictions or special instructions  Follow-Up:  On 10/16/19 at 3:40PM with Mertie Moores, MD  Other Instructions Schedule follow up  with Dr. Saunders Revel in [place] after ABI and arterial ultrasound.

## 2019-09-09 NOTE — Progress Notes (Signed)
Cardiology Office Note:    Date:  09/09/2019   ID:  Shelby Walker, DOB September 02, 1933, MRN [id number]  PCP:  Reynold Bowen, MD  Cardiologist:  Mertie Moores, MD   Electrophysiologist:  None   Referring MD: Reynold Bowen, MD   Chief Complaint:  Follow-up (Atrial fibrillation, peripheral arterial disease) and Shortness of Breath (Fatigue)    Patient Profile:    Shelby Walker is a 86 y.o. female with:   Paroxysmal atrial fibrillation   CHA2DS2-VASc=5 (age x 2, female, diab, vasc dz) >> Apixaban    Peripheral Arterial Disease w/ claudication   Conservative management; Dr. Saunders Revel  Mitral regurgitation, mild by echocardiogram in 2011  Carotid artery disease  [place] 12/2018: bilat ICA 01-Apr-2037  Aortic atherosclerosis (CT in 08/2018)  Diabetes mellitus   Hyperlipidemia   Prior CV studies: Carotid US 01/02/2019 Bilat ICA 2037/04/01; L VA stenosis   Echocardiogram 07/26/2018 EF 60-65, GR 1 DD, normal RV SF, moderate LAE, mild-moderate MAC  LE arterial Dopplers 08/27/2016 >50% right common iliac artery stenosis. 50-74% right distal popliteal artery stenosis. 50-74% left proximal to mid SFA stenosis. Three vessel run-off, bilaterally.  Myoview 12/19/2015 EF 77, no ischemia or infarction, low risk  History of Present Illness:    [name] was last seen by Dr. Acie Fredrickson in 04-02-19.  She returns for follow up.  She is here with her daughter.  Over the past few months, she has been tired/exhausted.  She has to rest just getting a shower.  She does note shortness of breath with some activities.  She sleeps on one pillow and has not had paroxysmal nocturnal dyspnea.  She has had some lower extremity swelling over the past several months.  She has not had syncope or chest discomfort.  Unfortunately, her husband died in April 02, 2019.  She has also remained somewhat fatigued after her bout with C. difficile last year.  She does have worsening claudication in bilateral lower extremities.  She had seen her PCP  recently who suggested that she return here for follow-up with repeat arterial Dopplers.  She does not have any open wounds or nonhealing ulcers on her feet.  She does not seem to have any rest pain.     Past Medical History:  Diagnosis Date  . Bruit    Carotid Doppler showed no significant abnormality     9per patient)  . C. difficile colitis 07/2018   with severe sepsis  . Chest pain, unspecified    Nuclear, May, 2008, no scar or ischemia  . Diabetes mellitus   . Diverticulosis   . Dyslipidemia   . Ejection fraction    EF 55-60%, echo, February, 2011  . GERD (gastroesophageal reflux disease)   . Mitral regurgitation April 21, 2009   mild,  echo, February, 2011  . Osteoporosis   . Palpitations    possible very brief atrial fibrillation on monitor and possible reentrant tachycardia  . Psoriasis   . Rheumatoid arthritis (HCC)     Current Medications: Current Meds  Medication Sig  . acetaminophen (TYLENOL) 325 MG tablet Take 2 tablets (650 mg total) by mouth every 6 (six) hours as needed for mild pain (or Fever >/= 101).  Marland Kitchen apixaban (ELIQUIS) 2.5 MG TABS tablet Take 1 tablet by mouth 2 times daily  . atorvastatin (LIPITOR) 20 MG tablet Take 1 tablet (20 mg total) by mouth daily.  . Calcium Carbonate-Vitamin D (CALTRATE 600+D) 600-400 MG-UNIT per tablet Take 1 tablet by mouth daily at 12 noon.   Marland Kitchen  carvedilol (COREG) 3.125 MG tablet TAKE 1 TABLET (3.125 MG TOTAL) BY MOUTH 2 (TWO) TIMES DAILY WITH A MEAL.  Marland Kitchen Cholecalciferol 25 MCG (1000 UT) capsule Take 1 capsule (1,000 Units total) by mouth daily.  Marland Kitchen dicyclomine (BENTYL) 10 MG capsule Take 10 mg by mouth as needed for spasms.  Marland Kitchen esomeprazole (NEXIUM) 40 MG capsule Take 1 capsule (40 mg total) by mouth daily at 12 noon.  . insulin aspart (NOVOLOG) 100 UNIT/ML FlexPen Inject 5 Units into the skin 3 (three) times daily with meals.  . Insulin Degludec (TRESIBA [place]) Inject 16 Units into the skin at bedtime as needed.  . Multiple  Vitamins-Minerals (CENTRUM SILVER 50+WOMEN PO) Take 1 tablet by mouth daily. 20 mg tablet  . nitroGLYCERIN (NITROSTAT) 0.4 MG SL tablet 1 TABLET UNDER TONGUE AT ONSET OF CHEST PAIN YOU MAY REPEAT EVERY 5 MINUTES FOR UP TO 3 DOSES.  . Nutritional Supplements (ENSURE ACTIVE PO) Patient only drink half of 237MLS  occasionally daily  . oxycodone (OXY-IR) 5 MG capsule Take 5 mg by mouth as needed for pain.   . predniSONE (DELTASONE) 5 MG tablet Take 5 mg by mouth daily. 2.43m in the morning and 2.5 in the evening  . RESTASIS 0.05 % ophthalmic emulsion Place 1 drop into both eyes 2 (two) times daily.     Allergies:   Cholestyramine, Compazine [prochlorperazine edisylate], and Sulfonamide derivatives   Social History   Tobacco Use  . Smoking status: Never Smoker  . Smokeless tobacco: Never Used  Vaping Use  . Vaping Use: Never used  Substance Use Topics  . Alcohol use: No    Alcohol/week: 0.0 standard drinks  . Drug use: No     Family Hx: The patient's family history includes Diabetes in her father and mother; Heart attack in her father and mother; Heart failure in her father and mother; Stroke in her sister.  Review of Systems  Constitutional: Positive for chills. Negative for fever.  Respiratory: Negative for hemoptysis.   Gastrointestinal: Negative for hematochezia and melena.  Genitourinary: Negative for hematuria.     EKGs/Labs/Other Test Reviewed:    EKG:  EKG is   ordered today.  The ekg ordered today demonstrates normal sinus rhythm, heart rate 87, normal axis, no acute ST-T wave changes, QTC 447  Recent Labs: 01/19/2019: ALT 10; BUN 41; Creatinine, Ser 1.27; Hemoglobin 9.9; Platelets 213.0; Potassium 4.0; Sodium 143   Recent Lipid Panel Lab Results  Component Value Date/Time   CHOL 129 12/24/2016 11:31 AM   TRIG 151 (H) 12/24/2016 11:31 AM   HDL 52 12/24/2016 11:31 AM   CHOLHDL 2.5 12/24/2016 11:31 AM   LDLCALC 47 12/24/2016 11:31 AM    Physical Exam:    VS:  BP  (!) 110/52   Pulse 87   Ht _0  (1.549 m)   Wt 137 lb 9.6 oz (62.4 kg)   SpO2 97%   BMI 26.00 kg/m     Wt Readings from Last 3 Encounters:  09/09/19 137 lb 9.6 oz (62.4 kg)  03/19/19 131 lb (59.4 kg)  01/27/19 133 lb (60.3 kg)     Constitutional:      Appearance: Healthy appearance. Not in distress.  Neck:     Thyroid: No thyromegaly.     Vascular: JVD normal.  Pulmonary:     Effort: Pulmonary effort is normal.     Breath sounds: No wheezing. Bibasilar Rales (very faint ? crackles at bases bilat) present.  Cardiovascular:  Normal rate. Regular rhythm. Normal S1. Normal S2.     Murmurs: There is no murmur.  Edema:    Pretibial: bilateral 1+ edema of the pretibial area. Abdominal:     Palpations: Abdomen is soft. There is no hepatomegaly.  Skin:    General: Skin is warm and dry.  Neurological:     Mental Status: Alert and oriented to person, place and time.     Cranial Nerves: Cranial nerves are intact.      ASSESSMENT & PLAN:    1. Shortness of breath Etiology of her symptoms of fatigue/exhaustion and shortness of breath or not entirely clear.  She is not having chest discomfort but question if her shortness of breath may be an anginal equivalent.  She certainly has multiple risk factors for coronary artery disease including peripheral arterial disease, carotid artery disease and diabetes.  She also appears to have some evidence of volume excess on exam.  Question if this may just be diastolic heart failure.  She may be struggling with deconditioning in the setting of worsening claudication from peripheral arterial disease.  She does have a history of anemia and is on chronic anticoagulation therapy.  Question if she may have worsening anemia.    -BMET, CBC, BNP  -Furosemide 20 mg daily x3 days, then stop  -If BNP significantly elevated, continue furosemide  -Obtain echocardiogram  -Arrange Lexiscan Myoview  -If EF down on echocardiogram, cancel Myoview and consider  cardiac catheterization  -Close follow-up in 4 to 6 weeks  2. PAF (paroxysmal atrial fibrillation) (HCC) Maintaining sinus rhythm.  She is tolerating anticoagulation.  Continue apixaban 2.5 mg twice daily given borderline weight and advanced age.  3. PAD (peripheral artery disease) ([place]) She had moderate disease based upon arterial Dopplers in 2018 in her bilateral lower extremities.  At that time, she was managed conservatively secondary to fairly stable symptoms.  She saw Dr. Saunders Revel at that time.  Her and her daughter would be willing to follow-up with him in [place].  Obtain follow-up ABIs/arterial Dopplers.  Arrange follow-up appointment with Dr. Saunders Revel in [place].  4. Aortic atherosclerosis ([place]) She is not on antiplatelet therapy as she is on Apixaban.  Continue atorvastatin.  5. Bilateral carotid artery stenosis Most recent carotid Dopplers in October 2020 with mild bilateral disease.  6. Essential hypertension The patient's blood pressure is controlled on her current regimen.  Continue current therapy.   7. Mixed hyperlipidemia Continue statin therapy.    Dispo:  Return in about 4 weeks (around 10/07/2019) for Follow up after testing, w/ Dr. Acie Fredrickson, or Richardson Dopp, PA-C, in person.   Medication Adjustments/Labs and Tests Ordered: Current medicines are reviewed at length with the patient today.  Concerns regarding medicines are outlined above.  Tests Ordered: Orders Placed This Encounter  Procedures  . Basic metabolic panel  . CBC  . Pro b natriuretic peptide (BNP)  . MYOCARDIAL PERFUSION IMAGING  . EKG 12-Lead  . ECHOCARDIOGRAM COMPLETE  . VAS [place] LOWER EXTREMITY ARTERIAL DUPLEX  . VAS [place] ABI WITH/WO TBI   Medication Changes: Meds ordered this encounter  Medications  . furosemide (LASIX) 20 MG tablet    Sig: Take 1 tablet by mouth for 3 days, then stop    Dispense:  30 tablet    Refill:  1    Signed, Richardson Dopp, PA-C  09/09/2019 5:33 PM    [facility] [address], [place], [place]  [address] Phone: [phone]; Fax: [phone]    

## 2019-09-10 ENCOUNTER — Encounter (HOSPITAL_COMMUNITY): Payer: Self-pay | Admitting: Physician Assistant

## 2019-09-10 LAB — BASIC METABOLIC PANEL
BUN/Creatinine Ratio: 31 — ABNORMAL HIGH (ref 12–28)
BUN: 37 mg/dL — ABNORMAL HIGH (ref 8–27)
CO2: 26 mmol/L (ref 20–29)
Calcium: 9 mg/dL (ref 8.7–10.3)
Chloride: 107 mmol/L — ABNORMAL HIGH (ref 96–106)
Creatinine, Ser: 1.19 mg/dL — ABNORMAL HIGH (ref 0.57–1.00)
GFR calc Af Amer: 48 mL/min/{1.73_m2} — ABNORMAL LOW (ref >59)
GFR calc non Af Amer: 42 mL/min/{1.73_m2} — ABNORMAL LOW (ref >59)
Glucose: 92 mg/dL (ref 65–99)
Potassium: 4.3 mmol/L (ref 3.5–5.2)
Sodium: 144 mmol/L (ref 134–144)

## 2019-09-10 LAB — CBC
Hematocrit: 32 % — ABNORMAL LOW (ref 34.0–46.6)
Hemoglobin: 10.5 g/dL — ABNORMAL LOW (ref 11.1–15.9)
MCH: 30.8 pg (ref 26.6–33.0)
MCHC: 32.8 g/dL (ref 31.5–35.7)
MCV: 94 fL (ref 79–97)
Platelets: 255 10*3/uL (ref 150–450)
RBC: 3.41 x10E6/uL — ABNORMAL LOW (ref 3.77–5.28)
RDW: 12.9 % (ref 11.7–15.4)
WBC: 9.7 10*3/uL (ref 3.4–10.8)

## 2019-09-10 LAB — PRO B NATRIURETIC PEPTIDE: NT-Pro BNP: 426 pg/mL (ref 0–738)

## 2019-09-18 ENCOUNTER — Other Ambulatory Visit: Payer: Self-pay | Admitting: Cardiovascular Disease

## 2019-10-07 ENCOUNTER — Other Ambulatory Visit: Payer: Self-pay

## 2019-10-07 ENCOUNTER — Ambulatory Visit (HOSPITAL_BASED_OUTPATIENT_CLINIC_OR_DEPARTMENT_OTHER): Payer: Medicare Other

## 2019-10-07 ENCOUNTER — Ambulatory Visit (HOSPITAL_COMMUNITY)
Admission: RE | Admit: 2019-10-07 | Discharge: 2019-10-07 | Disposition: A | Discharge status: Home / Self Care | Payer: Medicare Other | Source: Ambulatory Visit | Attending: Cardiovascular Disease | Admitting: Cardiovascular Disease

## 2019-10-07 ENCOUNTER — Encounter: Payer: Self-pay | Admitting: Physician Assistant

## 2019-10-07 DIAGNOSIS — R0602 Shortness of breath: Secondary | ICD-10-CM

## 2019-10-07 DIAGNOSIS — I739 Peripheral vascular disease, unspecified: Secondary | ICD-10-CM | POA: Insufficient documentation

## 2019-10-07 LAB — ECHOCARDIOGRAM COMPLETE
Area-P 1/2: 3.1 cm2
S' Lateral: 2.7 cm

## 2019-10-12 DIAGNOSIS — M7062 Trochanteric bursitis, left hip: Secondary | ICD-10-CM | POA: Diagnosis not present

## 2019-10-12 DIAGNOSIS — R600 Localized edema: Secondary | ICD-10-CM | POA: Diagnosis not present

## 2019-10-12 DIAGNOSIS — L409 Psoriasis, unspecified: Secondary | ICD-10-CM | POA: Diagnosis not present

## 2019-10-12 DIAGNOSIS — Z79899 Other long term (current) drug therapy: Secondary | ICD-10-CM | POA: Diagnosis not present

## 2019-10-12 DIAGNOSIS — Z6825 Body mass index (BMI) 25.0-25.9, adult: Secondary | ICD-10-CM | POA: Diagnosis not present

## 2019-10-12 DIAGNOSIS — M25511 Pain in right shoulder: Secondary | ICD-10-CM | POA: Diagnosis not present

## 2019-10-12 DIAGNOSIS — M2550 Pain in unspecified joint: Secondary | ICD-10-CM | POA: Diagnosis not present

## 2019-10-12 DIAGNOSIS — M810 Age-related osteoporosis without current pathological fracture: Secondary | ICD-10-CM | POA: Diagnosis not present

## 2019-10-12 DIAGNOSIS — M25562 Pain in left knee: Secondary | ICD-10-CM | POA: Diagnosis not present

## 2019-10-12 DIAGNOSIS — M069 Rheumatoid arthritis, unspecified: Secondary | ICD-10-CM | POA: Diagnosis not present

## 2019-10-12 DIAGNOSIS — M150 Primary generalized (osteo)arthritis: Secondary | ICD-10-CM | POA: Diagnosis not present

## 2019-10-12 DIAGNOSIS — E663 Overweight: Secondary | ICD-10-CM | POA: Diagnosis not present

## 2019-10-13 ENCOUNTER — Encounter (HOSPITAL_COMMUNITY): Payer: Self-pay | Admitting: *Deleted

## 2019-10-13 ENCOUNTER — Telehealth (HOSPITAL_COMMUNITY): Payer: Self-pay | Admitting: *Deleted

## 2019-10-13 NOTE — Telephone Encounter (Signed)
Patient's daughter was given per DPR detailed instructions per Myocardial Perfusion Study Information Sheet for the test on 10/19/2019 at 1000. Patient notified to arrive 15 minutes early and that it is imperative to arrive on time for appointment to keep from having the test rescheduled.  If you need to cancel or reschedule your appointment, please call the office within 24 hours of your appointment. . Patient verbalized understanding.[facility] Mychart letter sent with instructions.

## 2019-10-14 DIAGNOSIS — Z794 Long term (current) use of insulin: Secondary | ICD-10-CM | POA: Diagnosis not present

## 2019-10-14 DIAGNOSIS — E1129 Type 2 diabetes mellitus with other diabetic kidney complication: Secondary | ICD-10-CM | POA: Diagnosis not present

## 2019-10-14 DIAGNOSIS — N1832 Chronic kidney disease, stage 3b: Secondary | ICD-10-CM | POA: Diagnosis not present

## 2019-10-14 DIAGNOSIS — I130 Hypertensive heart and chronic kidney disease with heart failure and stage 1 through stage 4 chronic kidney disease, or unspecified chronic kidney disease: Secondary | ICD-10-CM | POA: Diagnosis not present

## 2019-10-16 ENCOUNTER — Ambulatory Visit (INDEPENDENT_AMBULATORY_CARE_PROVIDER_SITE_OTHER): Payer: Medicare Other | Admitting: Cardiovascular Disease

## 2019-10-16 ENCOUNTER — Encounter: Payer: Self-pay | Admitting: Cardiovascular Disease

## 2019-10-16 ENCOUNTER — Other Ambulatory Visit: Payer: Self-pay

## 2019-10-16 VITALS — BP 118/54 | HR 74 | Ht 61.0 in | Wt 135.0 lb

## 2019-10-16 DIAGNOSIS — I6523 Occlusion and stenosis of bilateral carotid arteries: Secondary | ICD-10-CM

## 2019-10-16 DIAGNOSIS — I480 Paroxysmal atrial fibrillation: Secondary | ICD-10-CM | POA: Diagnosis not present

## 2019-10-16 NOTE — Patient Instructions (Signed)
Medication Instructions:  Your provider recommends that you continue on your current medications as directed. Please refer to the Current Medication list given to you today.   *If you need a refill on your cardiac medications before your next appointment, please call your pharmacy*  Testing/Procedures: Your stress test has been cancelled.  Follow-Up: At [facility], you and your health needs are our priority.  As part of our continuing mission to provide you with exceptional heart care, we have created designated Provider Care Teams.  These Care Teams include your primary Cardiologist (physician) and Advanced Practice Providers (APPs -  Physician Assistants and Nurse Practitioners) who all work together to provide you with the care you need, when you need it. Your next appointment:   6 month(s) The format for your next appointment:   In Person Provider:   Richardson Dopp, PA-C

## 2019-10-16 NOTE — Progress Notes (Signed)
Cardiology Office Note   Date:  10/16/2019   ID:  Shelby Walker, DOB [birth year], MRN [id number]  PCP:  Reynold Bowen, MD  Cardiologist:  Mertie Moores, MD  ( transfer from Dr. Ron Parker)   Chief Complaint  Patient presents with  . Atrial Fibrillation  . PAD   follow-up palpitations and some chest discomfort in the past     Shelby Walker is a 86 y.o. female who presents today to follow-up a history of mild chest discomfort and palpitations in the past. She is feeling well and not having any significant symptoms.  Recently she had a mild case of shingles. She also had an upper respiratory infection that is being treated and improving.  Dec 09, 2015:  Shelby Walker is seen today for the first time - transfer from Ron Parker Followed for palpitations with some associated chest pain  Stays active,  Does yard work.   These can last as long as 30 minutes .   No CP or palpitations with yard work . Typically occur at night  The CP starts first and then she has the palpitations  No recent myoview No recent monitor   August 09, 2016:  Shelby Walker is seen for follow up of her carotid disease  She had been having some chest pain . Myoview was low risk.  Still very active,  No further CP .   Has moderate carotid disease Has some burning in her legs with walking and at night   May 01, 2017.  Shelby Walker  is seen today for follow-up of her claudication and episodes of chest pain.3 She is getting over pneumonia .   Leg pain / claudication  has improved.   Has seen Dr. Saunders Revel for PAD .  She was found to have noncritical peripheral arterial disease.  She has not wanted to have any procedure and at this point she has not needed it. Dr. Forde Dandy follows / manages her lipids and diabetes.  Will request labs from recent visit   Oct. 2, 2019:  Shelby Walker is seen today .  Seen with daughter , [name]  Had an episode of syncope several weeks ago ( was very hot , standing for 45 min at a funeral )  Had a mild episode of  lightheadeeness yesterday  Admits that she does eat well or drink enough Associated with a fast HR ( felt that her heart was pounding )  Has mild carotid disease. Scheduled to have repeat carotid duplex later this month  Jan. 14, 2021 Shelby Walker is seen today for follow up of her hyperlipidemia, diabetes mellitus and chest pain and PAF  Her husband passed away this past 2022/06/30  No CP or dyspnea.  No syncope  Has been found to have atrial fib and is on eliquis   October 16, 2019: Shelby Walker is seen today. She was seen by Richardson Dopp, PA on July 7 for increasing shortness of breath, fatigue. Echocardiogram revealed normal left ventricular systolic function-ejection fraction is 60 to 65%..  She has mild diastolic dysfunction. Lower extremity duplex scan reveals stable bilateral peripheral arterial disease that is unchanged from previous study. She is scheduled for Lexiscan Myoview study next week.  She does not want to have the stress test.      Past Medical History:  Diagnosis Date  . Bruit    Carotid Doppler showed no significant abnormality     9per patient)  . C. difficile colitis 07/2018   with severe sepsis  .  Chest pain, unspecified    Nuclear, May, 2008, no scar or ischemia  . Diabetes mellitus   . Diverticulosis   . Dyslipidemia   . Ejection fraction    EF 55-60%, echo, February, 2011 // Echocardiogram 8/21: EF 60-65, no RWMA, Gr 1 DD, GLS -14%, normal RVSF, mild LAE, trivial MR, mild Shelby (mean 4 mmHg), RVSP 23.4   . GERD (gastroesophageal reflux disease)   . Mitral regurgitation April 21, 2009   mild,  echo, February, 2011  . Osteoporosis   . Palpitations    possible very brief atrial fibrillation on monitor and possible reentrant tachycardia  . Psoriasis   . Rheumatoid arthritis [facility])     Past Surgical History:  Procedure Laterality Date  . FRACTURE SURGERY      Patient Active Problem List   Diagnosis Date Noted  . PAF (paroxysmal atrial fibrillation)  (HCC)     Priority: High  . Decreased anal sphincter tone 12/13/2018  . 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  . Irritable bowel syndrome with diarrhea - post infectious 12/12/2018  . Full incontinence of feces and fecal smearing 12/12/2018  . Perianal dermatitis 12/12/2018  . Uncontrolled type 2 diabetes mellitus with hyperglycemia ([place]) 10/26/2018  . Long-term use of immunosuppressant medication-Enbrel 10/26/2018  . 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 Thrombocytopenia ([place])   . Essential hypertension   . Shortness of breath   . Steroid-induced hyperglycemia   . Mixed hyperlipidemia 09/29/2016  . Stable angina ([place]) 09/29/2016  . Claudication in peripheral vascular disease ([place]) 08/09/2016  . Preoperative clearance 05/21/2012  . Precordial chest pain   . Palpitations   . Diabetes mellitus ([place])   . GERD (gastroesophageal reflux disease)   . Ejection fraction   . Carotid artery disease ([place])   . Mitral regurgitation 04/21/2009  . PSORIASIS 02/09/2009  . Rheumatoid arthritis flare ([place]) 02/09/2009      Current Outpatient Medications  Medication Sig Dispense Refill  . acetaminophen (TYLENOL) 325 MG tablet Take 2 tablets (650 mg total) by mouth every 6 (six) hours as needed for mild pain (or Fever >/= 101).    Marland Kitchen apixaban (ELIQUIS) 2.5 MG TABS tablet Take 1 tablet by mouth 2 times daily 60 tablet 5  . atorvastatin (LIPITOR) 20 MG tablet Take 1 tablet (20 mg total) by mouth daily. 90 tablet 3  . Calcium Carbonate-Vitamin D (CALTRATE 600+D) 600-400 MG-UNIT per tablet Take 1 tablet by mouth daily at 12 noon.     . carvedilol (COREG) 3.125 MG tablet TAKE 1 TABLET BY MOUTH TWICE DAILY WITH MEALS 180 tablet 3  . Cholecalciferol 25 MCG (1000 UT) capsule Take 1 capsule (1,000 Units total) by mouth daily. 30 capsule 0  . dicyclomine (BENTYL) 10 MG capsule Take 10 mg by mouth as needed for spasms.    Marland Kitchen esomeprazole (NEXIUM) 40 MG capsule Take 1 capsule (40 mg total) by  mouth daily at 12 noon. 30 capsule 0  . furosemide (LASIX) 20 MG tablet Take 1 tablet by mouth for 3 days, then stop 30 tablet 1  . insulin aspart (NOVOLOG) 100 UNIT/ML FlexPen Inject 5 Units into the skin 3 (three) times daily with meals.    . Insulin Degludec (TRESIBA [place]) Inject 16 Units into the skin at bedtime as needed.    . Multiple Vitamins-Minerals (CENTRUM SILVER 50+WOMEN PO) Take 1 tablet by mouth daily. 20 mg tablet    . nitroGLYCERIN (NITROSTAT) 0.4 MG SL tablet 1 TABLET UNDER TONGUE AT ONSET OF CHEST  PAIN YOU MAY REPEAT EVERY 5 MINUTES FOR UP TO 3 DOSES. 25 tablet 3  . Nutritional Supplements (ENSURE ACTIVE PO) Patient only drink half of 237MLS  occasionally daily    . oxycodone (OXY-IR) 5 MG capsule Take 5 mg by mouth as needed for pain.     . predniSONE (DELTASONE) 5 MG tablet Take 5 mg by mouth daily. 2.5mg  in the morning and 2.5 in the evening    . RESTASIS 0.05 % ophthalmic emulsion Place 1 drop into both eyes 2 (two) times daily.    . traMADol (ULTRAM) 50 MG tablet Take 50 mg by mouth daily as needed.     No current facility-administered medications for this visit.    Allergies:   Cholestyramine, Compazine [prochlorperazine edisylate], and Sulfonamide derivatives    Social History:  The patient  reports that she has never smoked. She has never used smokeless tobacco. She reports that she does not drink alcohol and does not use drugs.   Family History:  The patient's family history includes Diabetes in her father and mother; Heart attack in her father and mother; Heart failure in her father and mother; Stroke in her sister.    ROS: As per current history.  Other systems are negative.  Physical Exam: Blood pressure (!) 118/54, pulse 74, height 5\' 1"  (1.549 m), weight 135 lb (61.2 kg), SpO2 96 %.  GEN:  Well nourished, well developed in no acute distress HEENT: Normal NECK: No JVD; left carotid bruit  LYMPHATICS: No lymphadenopathy CARDIAC: RRR , no murmurs, rubs,  gallops RESPIRATORY:  Clear to auscultation without rales, wheezing or rhonchi  ABDOMEN: Soft, non-tender, non-distended MUSCULOSKELETAL:  No edema; No deformity  SKIN: Warm and dry NEUROLOGIC:  Alert and oriented x 3   EKG:    Recent Labs: 01/19/2019: ALT 10 09/09/2019: BUN 37; Creatinine, Ser 1.19; Hemoglobin 10.5; NT-Pro BNP 426; Platelets 255; Potassium 4.3; Sodium 144    Lipid Panel    Component Value Date/Time   CHOL 129 12/24/2016 1131   TRIG 151 (H) 12/24/2016 1131   HDL 52 12/24/2016 1131   CHOLHDL 2.5 12/24/2016 1131   LDLCALC 47 12/24/2016 1131      Wt Readings from Last 3 Encounters:  10/16/19 135 lb (61.2 kg)  09/09/19 137 lb 9.6 oz (62.4 kg)  03/19/19 131 lb (59.4 kg)      Current medicines are reviewed  The patient understands her medications well.     ASSESSMENT AND PLAN:  1. Atrial fib :  Paroxysmal .     Cont eliquis    2. Left carotid bruit: -     . Has mild bilateral carotid artery disease .  Will repeat next year.  She is not having any neurologic symptoms.  3. Claudication:  Stable.  Sees Dr. Saunders Revel      4.  Generalized weakness.  Echo shows normal LV systolic function.   She has diastolic dysfunction - grade 1  Her overall health is declined somewhat since her last visit ( age related)  She does not want to have the myoview - will cancel  I told her we could discuss this in the future if needed.  She is not very inclined to have any invasive or interventional procedures if a Myoview study would be abnormal.  Follow up with Richardson Dopp ni 1 year   Mertie Moores, MD  10/16/2019 11:28 AM    [facility] [address]  [place], [place]  [address] Pager [place] Phone: [phone]; Fax: [phone]

## 2019-10-19 ENCOUNTER — Encounter (HOSPITAL_COMMUNITY): Payer: Medicare Other

## 2019-10-19 ENCOUNTER — Telehealth: Payer: Self-pay | Admitting: Cardiovascular Disease

## 2019-10-19 NOTE — Telephone Encounter (Signed)
    Pt's daughter calling, she said Dr. Acie Fredrickson helping pt to get patient assistance for Eliquis. She said she is reading the paper work and it says they need a prescription attach to the paperwork. She would like to get prescription for eliquis

## 2019-10-19 NOTE — Telephone Encounter (Signed)
**Note De-Identified  Obfuscation** Shelby Walker states that a nurse named Valetta Fuller helped her with a [place] Pt asst application for Eliquis for her mom while her mom was at the office for a f/u with Dr Acie Fredrickson on Friday 8/13.  She states that Valetta Fuller filled out a page and she thinks that Dr Acie Fredrickson signed it. She does not have the application with her at this time as she is at work.  I have advised her to look over the application and if Dr Acie Fredrickson signed the provider page of the application and Eliquis 2.5 mg was checked with instruction to take 1 tablet BID #180 with 3 refills and if so that would be all they need as that is the prescription.  She is aware to call Jeani Hawking at Dr Lanny Hurst office if the MD page with Dr Acie Fredrickson signature is not complete and that we will take care of it and will fax to [place] for her.  She thanked me for calling her to discuss.

## 2019-10-21 ENCOUNTER — Other Ambulatory Visit: Payer: Self-pay

## 2019-10-21 ENCOUNTER — Encounter: Payer: Self-pay | Admitting: Internal Medicine

## 2019-10-21 ENCOUNTER — Ambulatory Visit (INDEPENDENT_AMBULATORY_CARE_PROVIDER_SITE_OTHER): Payer: Medicare Other | Admitting: Internal Medicine

## 2019-10-21 VITALS — BP 134/50 | HR 70 | Ht 61.5 in | Wt 135.0 lb

## 2019-10-21 DIAGNOSIS — R0600 Dyspnea, unspecified: Secondary | ICD-10-CM | POA: Diagnosis not present

## 2019-10-21 DIAGNOSIS — I480 Paroxysmal atrial fibrillation: Secondary | ICD-10-CM

## 2019-10-21 DIAGNOSIS — R5383 Other fatigue: Secondary | ICD-10-CM

## 2019-10-21 DIAGNOSIS — I739 Peripheral vascular disease, unspecified: Secondary | ICD-10-CM

## 2019-10-21 DIAGNOSIS — R0609 Other forms of dyspnea: Secondary | ICD-10-CM

## 2019-10-21 NOTE — Patient Instructions (Signed)
Medication Instructions:  Your physician recommends that you continue on your current medications as directed. Please refer to the Current Medication list given to you today.  *If you need a refill on your cardiac medications before your next appointment, please call your pharmacy*   Lab Work: NONE If you have labs (blood work) drawn today and your tests are completely normal, you will receive your results only by: Marland Kitchen MyChart Message (if you have MyChart) OR . A paper copy in the mail If you have any lab test that is abnormal or we need to change your treatment, we will call you to review the results.   Testing/Procedures: IN 1 YEAR AT [facility] has requested that you have a lower extremity arterial doppler- During this test, ultrasound is used to evaluate arterial blood flow in the legs. Allow approximately one hour for this exam.   IN 1 YEAR AT [facility] physician has requested that you have an ankle brachial index (ABI). During this test an ultrasound and blood pressure cuff are used to evaluate the arteries that supply the arms and legs with blood. Allow thirty minutes for this exam. There are no restrictions or special instructions.   Follow-Up: At [facility], you and your health needs are our priority.  As part of our continuing mission to provide you with exceptional heart care, we have created designated Provider Care Teams.  These Care Teams include your primary Cardiologist (physician) and Advanced Practice Providers (APPs -  Physician Assistants and Nurse Practitioners) who all work together to provide you with the care you need, when you need it.  We recommend signing up for the patient portal called "MyChart".  Sign up information is provided on this After Visit Summary.  MyChart is used to connect with patients for Virtual Visits (Telemedicine).  Patients are able to view lab/test results, encounter notes, upcoming appointments, etc.   Non-urgent messages can be sent to your provider as well.   To learn more about what you can do with MyChart, go to [email or web address].    Your next appointment:   12 month(s) - Please make sure ABI and LEA doppler studies have been done at [facility] Prior to appointment with Dr End.   The format for your next appointment:   In Person  Provider:    You may see DR Harrell Gave END or one of the following Advanced Practice Providers on your designated Care Team:    Murray Hodgkins, NP  Christell Faith, PA-C  Marrianne Mood, PA-C    Ankle-Brachial Index Test Why am I having this test? The ankle-brachial index (ABI) test is used to diagnose peripheral vascular disease (PVD). PVD is also known as peripheral arterial disease (PAD). PVD is the blocking or hardening of the arteries anywhere within the circulatory system beyond the heart. PVD is caused by:  Cholesterol deposits in your blood vessels (atherosclerosis). This is the most common cause of this condition.  Irritation and swelling (inflammation) in the blood vessels.  Blood clots in the vessels. Cholesterol deposits cause arteries to narrow. Normal delivery of oxygen to your tissues is affected, causing muscle pain and fatigue. This is called claudication. PVD means that there may also be a buildup of cholesterol:  In your heart. This increases the risk of heart attacks.  In your brain. This increases the risk of strokes. What is being tested? The ankle-brachial index test measures the blood flow in your arms and legs. The blood flow will show if  blood vessels in your legs have been narrowed by cholesterol deposits. How do I prepare for this test?  Wear loose clothing.  Do not use any tobacco products, including cigarettes, chewing tobacco, or e-cigarettes, for at least 30 minutes before the test. What happens during the test?  1. You will lie down in a resting position. 2. Your health care provider will use a blood  pressure machine and a small ultrasound device (Doppler) to measure the systolic pressures on your upper arms and ankles. Systolic pressure is the pressure inside your arteries when your heart pumps. 3. Systolic pressure measurements will be taken several times, and at several points, on both the ankle and the arm. 4. Your health care provider will divide the highest systolic pressure of the ankle by the highest systolic pressure of the arm. The result is the ankle-brachial pressure ratio, or ABI. Sometimes this test will be repeated after you have exercised on a treadmill for 5 minutes. You may have leg pain during the exercise portion of the test if you suffer from PVD. If the index number drops after exercise, this may show that PVD is present. How are the results reported? Your test results will be reported as a value that shows the ratio of your ankle pressure to your arm pressure (ABI ratio). Your health care provider will compare your results to normal ranges that were established after testing a large group of people (reference ranges). Reference ranges may vary among labs and hospitals. For this test, a common reference range is:  ABI ratio of 0.9 to 1.3. What do the results mean? An ABI ratio that is below the reference range is considered abnormal and may indicate PVD in the legs. Talk with your health care provider about what your results mean. Questions to ask your health care provider Ask your health care provider, or the department that is doing the test:  When will my results be ready?  How will I get my results?  What are my treatment options?  What other tests do I need?  What are my next steps? Summary  The ankle-brachial index (ABI) test is used to diagnose peripheral vascular disease (PVD). PVD is also known as peripheral arterial disease (PAD).  The ankle-brachial index test measures the blood flow in your arms and legs.  The highest systolic pressure of the ankle  is divided by the highest systolic pressure of the arm. The result is the ABI ratio.  An ABI ratio that is below 0.9 is considered abnormal and may indicate PVD in the legs. This information is not intended to replace advice given to you by your health care provider. Make sure you discuss any questions you have with your health care provider. Document Revised: 12/12/2017 Document Reviewed: 11/13/2016 Elsevier Patient Education  2020 [employer].

## 2019-10-21 NOTE — Progress Notes (Signed)
Follow-up Outpatient Visit Date: 10/21/2019  Primary Care Provider: Reynold Bowen, [place] [address]  Chief Complaint: Follow-up PAD  HPI:  [name] is a 86 y.o. female with history of peripheral vascular disease by noninvasive studies, diabetes mellitus, dyslipidemia, mitral regurgitation, and atrial fibrillation followed by Dr. Acie Fredrickson, who presents for follow-up of claudication.  I last saw [name] in 2018, at which time she reported only slight leg pain after prolonged walking.  She was seen this summer by Richardson Dopp, PA, and Dr. Acie Fredrickson, at which time she reported stable mild claudication.  Bilateral lower extremity noninvasive vascular studies showed relatively stable moderate outflow disease and continued two-vessel runoff.  Today, [name] is most bothered by left knee and hip pain as well as some generalized fatigue.  She did not have any pain in her thighs or calves in either leg.  She notes some intermittent leg swelling, left greater than right.  She and her daughter report increasing "fatigue" with associated shortness of breath with minimal activity.  She remains reluctant to proceed with myocardial perfusion stress testing due to side effects from the stress agent in the past.  She has not had any chest pain.  She denies palpitations, lightheadedness, orthopnea, and PND.  --------------------------------------------------------------------------------------------------  Cardiovascular History & Procedures: Cardiovascular Problems:  Peripheral vascular disease  Mitral regurgitation  Carotid artery stenosis  Risk Factors:  Peripheral and cerebrovascular disease, hyperlipidemia, diabetes mellitus, and age > 56  Cath/PCI:  None  CV Surgery:  None  EP Procedures and Devices:  None  Non-Invasive Evaluation(s):  Lower extremity arterial duplex (10/07/2019):  ABIs 1.1 on the right and 1.1 on the left.  TBI's 0.69 on the right and  0.74 on the left.  Duplex shows 50 to 74% stenosis in the right proximal SFA and 30 to 49% stenosis of the right distal popliteal artery.  Right PTA is occluded distally.  Left proximal SFA has 50 to 74% stenosis.  Left distal PTA is occluded.  TTE (10/07/2019): Normal LV size with LVEF 60-65% and normal regional wall motion.  Grade 1 diastolic dysfunction.  Normal RV size and function.  Normal pulmonary artery pressure.  Mild left atrial enlargement.  Trivial MR and mild mitral stenosis.  Carotid Doppler (01/02/2019): Mild bilateral ICA disease (less than 40%).  Less than 50% plaquing of both PCAs.  High resistance flow noted in the left vertebral artery; question proximal stenosis.  Lower extremity arterial duplex (08/27/16): Technically challenging study. >50% right common iliac artery stenosis. 50-74% right distal popliteal artery stenosis. 50-74% left proximal to mid SFA stenosis.Three vessel run-off, bilaterally. ABI's: right 0.91, left 0.97; TBI's right 0.50, left 0.57.  Bilateral carotid Doppler (12/19/15): Heterogeneous plaque, bilaterally. Stable 40-59% bilateral ICA stenosis. Normal subclavian arteries, bilaterally. Patent vertebral arteries with antegrade flow.  Pharmacologic MPI (12/19/15): Low risk study without ischemia or scar. LVEF 77%.  Recent CV Pertinent Labs: Lab Results  Component Value Date   CHOL 129 12/24/2016   HDL 52 12/24/2016   LDLCALC 47 12/24/2016   TRIG 151 (H) 12/24/2016   CHOLHDL 2.5 12/24/2016   INR 1.1 08/02/2018   K 4.3 09/09/2019   MG 2.2 08/13/2018   BUN 37 (H) 09/09/2019   CREATININE 1.19 (H) 09/09/2019    Past medical and surgical history were reviewed and updated in EPIC.  Current Meds  Medication Sig  . acetaminophen (TYLENOL) 325 MG tablet Take 2 tablets (650 mg total) by mouth every 6 (six) hours as  needed for mild pain (or Fever >/= 101).  Marland Kitchen apixaban (ELIQUIS) 2.5 MG TABS tablet Take 1 tablet by mouth 2 times daily  . atorvastatin (LIPITOR)  20 MG tablet Take 1 tablet (20 mg total) by mouth daily.  . Calcium Carbonate-Vitamin D (CALTRATE 600+D) 600-400 MG-UNIT per tablet Take 1 tablet by mouth daily at 12 noon.   . carvedilol (COREG) 3.125 MG tablet TAKE 1 TABLET BY MOUTH TWICE DAILY WITH MEALS  . Cholecalciferol 25 MCG (1000 UT) capsule Take 1 capsule (1,000 Units total) by mouth daily.  Marland Kitchen esomeprazole (NEXIUM) 40 MG capsule Take 1 capsule (40 mg total) by mouth daily at 12 noon.  . insulin aspart (NOVOLOG) 100 UNIT/ML FlexPen Inject 5 Units into the skin 3 (three) times daily with meals.  . Insulin Degludec (TRESIBA [place]) Inject 16 Units into the skin at bedtime as needed.  . Multiple Vitamins-Minerals (CENTRUM SILVER 50+WOMEN PO) Take 1 tablet by mouth daily. 20 mg tablet  . nitroGLYCERIN (NITROSTAT) 0.4 MG SL tablet 1 TABLET UNDER TONGUE AT ONSET OF CHEST PAIN YOU MAY REPEAT EVERY 5 MINUTES FOR UP TO 3 DOSES.  . Nutritional Supplements (ENSURE ACTIVE PO) Patient only drink half of 237MLS  occasionally daily  . predniSONE (DELTASONE) 5 MG tablet Take 5 mg by mouth daily. 2.5mg  in the morning and 2.5 in the evening  . RESTASIS 0.05 % ophthalmic emulsion Place 1 drop into both eyes 2 (two) times daily.  . traMADol (ULTRAM) 50 MG tablet Take 50 mg by mouth daily as needed.    Allergies: Cholestyramine, Compazine [prochlorperazine edisylate], and Sulfonamide derivatives  Social History   Tobacco Use  . Smoking status: Never Smoker  . Smokeless tobacco: Never Used  Vaping Use  . Vaping Use: Never used  Substance Use Topics  . Alcohol use: No    Alcohol/week: 0.0 standard drinks  . Drug use: No    Family History  Problem Relation Age of Onset  . Heart failure Father   . Heart attack Father   . Diabetes Father   . Heart attack Mother   . Heart failure Mother   . Diabetes Mother   . Stroke Sister     Review of Systems: A 12-system review of systems was performed and was negative except as noted in the  HPI.  --------------------------------------------------------------------------------------------------  Physical Exam: BP (!) 134/50 (BP Location: Right Arm, Patient Position: Sitting, Cuff Size: Normal)   Pulse 70   Ht 5' 1.5" (1.562 m)   Wt 135 lb (61.2 kg)   SpO2 97%   BMI 25.09 kg/m   General: NAD.  Accompanied by her daughter. Neck: No JVD or HJR. Lungs: Clear to auscultation without wheezes or crackles. Heart: Regular rate and rhythm without murmurs, rubs, or gallops. Abdomen: Soft, nontender, nondistended. Extremities: Trace pretibial edema, right greater than left.  Posterior tibial and dorsalis pedis pulses are trace bilaterally.  No wounds on either foot.  Continue with IV as needed overlying both shins.   Lab Results  Component Value Date   WBC 9.7 09/09/2019   HGB 10.5 (L) 09/09/2019   HCT 32.0 (L) 09/09/2019   MCV 94 09/09/2019   PLT 255 09/09/2019    Lab Results  Component Value Date   NA 144 09/09/2019   K 4.3 09/09/2019   CL 107 (H) 09/09/2019   CO2 26 09/09/2019   BUN 37 (H) 09/09/2019   CREATININE 1.19 (H) 09/09/2019   GLUCOSE 92 09/09/2019   ALT 10 01/19/2019  Lab Results  Component Value Date   CHOL 129 12/24/2016   HDL 52 12/24/2016   LDLCALC 47 12/24/2016   TRIG 151 (H) 12/24/2016   CHOLHDL 2.5 12/24/2016    --------------------------------------------------------------------------------------------------  ASSESSMENT AND PLAN: PAD: [name] reports chronic pain involving the left hip and knee that is most likely musculoskeletal in nature.  Pedal pulses are diminished on exam today, though recent ABIs were normal.  Bilateral lower extremity duplex studies showed moderate outflow disease predominantly involving the bilateral proximal SFAs, similar to the prior study from 2018.  Two-vessel runoff was again noted.  I have recommended that we continue with medical management, including aggressive risk factor modification with statin therapy  and low-dose aspirin.  We will plan to repeat noninvasive vascular studies in 1 year.  Fatigue and dyspnea on exertion: I agree that this could be due to myocardial ischemia and have suggested that [name] move forward with a stress test ordered by Richardson Dopp, PA, and Dr. Acie Fredrickson.  Paroxysmal atrial fibrillation: Exam today consistent with sinus rhythm, as noted on EKG last month.  Continue apixaban and carvedilol, per Dr. Acie Fredrickson.  Follow-up: Return to clinic in 1 year with repeat ABIs and lower extremity arterial duplex studies shortly before follow-up visit.  Nelva Bush, MD 10/21/2019 3:25 PM

## 2019-10-26 NOTE — Telephone Encounter (Signed)
**Note De-Identified  Obfuscation** Letter received from [employer] stating that they have approved the pt for asst with her Eliquis. Approval is valid until 03/04/2020. Application Case#: [id number]  The letter states that they have notified the pt of this approval as well.

## 2019-11-06 ENCOUNTER — Other Ambulatory Visit: Payer: Self-pay

## 2019-11-06 DIAGNOSIS — F4321 Adjustment disorder with depressed mood: Secondary | ICD-10-CM | POA: Diagnosis not present

## 2019-11-06 DIAGNOSIS — I739 Peripheral vascular disease, unspecified: Secondary | ICD-10-CM | POA: Diagnosis not present

## 2019-11-06 DIAGNOSIS — L409 Psoriasis, unspecified: Secondary | ICD-10-CM | POA: Diagnosis not present

## 2019-11-06 DIAGNOSIS — E785 Hyperlipidemia, unspecified: Secondary | ICD-10-CM | POA: Diagnosis not present

## 2019-11-06 DIAGNOSIS — L4050 Arthropathic psoriasis, unspecified: Secondary | ICD-10-CM | POA: Diagnosis not present

## 2019-11-06 DIAGNOSIS — I130 Hypertensive heart and chronic kidney disease with heart failure and stage 1 through stage 4 chronic kidney disease, or unspecified chronic kidney disease: Secondary | ICD-10-CM | POA: Diagnosis not present

## 2019-11-06 DIAGNOSIS — M150 Primary generalized (osteo)arthritis: Secondary | ICD-10-CM | POA: Diagnosis not present

## 2019-11-06 DIAGNOSIS — E663 Overweight: Secondary | ICD-10-CM | POA: Diagnosis not present

## 2019-11-06 DIAGNOSIS — M069 Rheumatoid arthritis, unspecified: Secondary | ICD-10-CM | POA: Diagnosis not present

## 2019-11-06 DIAGNOSIS — Z79899 Other long term (current) drug therapy: Secondary | ICD-10-CM | POA: Diagnosis not present

## 2019-11-06 DIAGNOSIS — E782 Mixed hyperlipidemia: Secondary | ICD-10-CM

## 2019-11-06 DIAGNOSIS — I700 Atherosclerosis of aorta: Secondary | ICD-10-CM | POA: Diagnosis not present

## 2019-11-06 DIAGNOSIS — Z6825 Body mass index (BMI) 25.0-25.9, adult: Secondary | ICD-10-CM | POA: Diagnosis not present

## 2019-11-06 DIAGNOSIS — M25562 Pain in left knee: Secondary | ICD-10-CM | POA: Diagnosis not present

## 2019-11-06 DIAGNOSIS — E1140 Type 2 diabetes mellitus with diabetic neuropathy, unspecified: Secondary | ICD-10-CM | POA: Diagnosis not present

## 2019-11-06 DIAGNOSIS — K760 Fatty (change of) liver, not elsewhere classified: Secondary | ICD-10-CM | POA: Diagnosis not present

## 2019-11-06 DIAGNOSIS — E46 Unspecified protein-calorie malnutrition: Secondary | ICD-10-CM | POA: Diagnosis not present

## 2019-11-06 DIAGNOSIS — M810 Age-related osteoporosis without current pathological fracture: Secondary | ICD-10-CM | POA: Diagnosis not present

## 2019-11-06 DIAGNOSIS — R600 Localized edema: Secondary | ICD-10-CM | POA: Diagnosis not present

## 2019-11-06 DIAGNOSIS — N1832 Chronic kidney disease, stage 3b: Secondary | ICD-10-CM | POA: Diagnosis not present

## 2019-11-06 DIAGNOSIS — I480 Paroxysmal atrial fibrillation: Secondary | ICD-10-CM | POA: Diagnosis not present

## 2019-11-06 MED ORDER — ATORVASTATIN CALCIUM 20 MG PO TABS: 20.0000 mg | ORAL_TABLET | Freq: Every day | ORAL | 3 refills | Status: DC

## 2019-11-06 NOTE — Telephone Encounter (Signed)
This is a [place] pt, Dr. End 

## 2019-12-09 DIAGNOSIS — Z23 Encounter for immunization: Secondary | ICD-10-CM | POA: Diagnosis not present

## 2019-12-23 DIAGNOSIS — E1140 Type 2 diabetes mellitus with diabetic neuropathy, unspecified: Secondary | ICD-10-CM | POA: Diagnosis not present

## 2019-12-23 DIAGNOSIS — N1832 Chronic kidney disease, stage 3b: Secondary | ICD-10-CM | POA: Diagnosis not present

## 2019-12-23 DIAGNOSIS — Z23 Encounter for immunization: Secondary | ICD-10-CM | POA: Diagnosis not present

## 2019-12-23 DIAGNOSIS — Z794 Long term (current) use of insulin: Secondary | ICD-10-CM | POA: Diagnosis not present

## 2019-12-23 DIAGNOSIS — I130 Hypertensive heart and chronic kidney disease with heart failure and stage 1 through stage 4 chronic kidney disease, or unspecified chronic kidney disease: Secondary | ICD-10-CM | POA: Diagnosis not present

## 2020-01-04 DIAGNOSIS — J069 Acute upper respiratory infection, unspecified: Secondary | ICD-10-CM | POA: Diagnosis not present

## 2020-01-04 DIAGNOSIS — Z1152 Encounter for screening for COVID-19: Secondary | ICD-10-CM | POA: Diagnosis not present

## 2020-01-04 DIAGNOSIS — R059 Cough, unspecified: Secondary | ICD-10-CM | POA: Diagnosis not present

## 2020-01-13 DIAGNOSIS — Z6825 Body mass index (BMI) 25.0-25.9, adult: Secondary | ICD-10-CM | POA: Diagnosis not present

## 2020-01-13 DIAGNOSIS — L409 Psoriasis, unspecified: Secondary | ICD-10-CM | POA: Diagnosis not present

## 2020-01-13 DIAGNOSIS — F4321 Adjustment disorder with depressed mood: Secondary | ICD-10-CM | POA: Diagnosis not present

## 2020-01-13 DIAGNOSIS — E663 Overweight: Secondary | ICD-10-CM | POA: Diagnosis not present

## 2020-01-13 DIAGNOSIS — R600 Localized edema: Secondary | ICD-10-CM | POA: Diagnosis not present

## 2020-01-13 DIAGNOSIS — M25562 Pain in left knee: Secondary | ICD-10-CM | POA: Diagnosis not present

## 2020-01-13 DIAGNOSIS — M069 Rheumatoid arthritis, unspecified: Secondary | ICD-10-CM | POA: Diagnosis not present

## 2020-01-13 DIAGNOSIS — M150 Primary generalized (osteo)arthritis: Secondary | ICD-10-CM | POA: Diagnosis not present

## 2020-01-13 DIAGNOSIS — Z794 Long term (current) use of insulin: Secondary | ICD-10-CM | POA: Diagnosis not present

## 2020-01-13 DIAGNOSIS — M810 Age-related osteoporosis without current pathological fracture: Secondary | ICD-10-CM | POA: Diagnosis not present

## 2020-01-13 DIAGNOSIS — I130 Hypertensive heart and chronic kidney disease with heart failure and stage 1 through stage 4 chronic kidney disease, or unspecified chronic kidney disease: Secondary | ICD-10-CM | POA: Diagnosis not present

## 2020-01-13 DIAGNOSIS — N1832 Chronic kidney disease, stage 3b: Secondary | ICD-10-CM | POA: Diagnosis not present

## 2020-01-13 DIAGNOSIS — E1140 Type 2 diabetes mellitus with diabetic neuropathy, unspecified: Secondary | ICD-10-CM | POA: Diagnosis not present

## 2020-01-13 DIAGNOSIS — Z79899 Other long term (current) drug therapy: Secondary | ICD-10-CM | POA: Diagnosis not present

## 2020-02-01 ENCOUNTER — Telehealth: Payer: Self-pay

## 2020-02-01 NOTE — Telephone Encounter (Signed)
Paperwork signed and faxed.

## 2020-02-01 NOTE — Telephone Encounter (Signed)
**Note De-Identified  Obfuscation** A completed [employer] application was left at the office for this pt. I have completed the MD page of the application and have emailed all to the nurse working with Dr Acie Fredrickson today so she can obtain his signature and to fax all to [facility] at the fax number written on cover letter included.

## 2020-02-04 ENCOUNTER — Other Ambulatory Visit: Payer: Self-pay

## 2020-02-04 MED ORDER — NITROGLYCERIN 0.4 MG SL SUBL: 0.4000 mg | SUBLINGUAL_TABLET | SUBLINGUAL | 6 refills | Status: DC | PRN

## 2020-02-04 NOTE — Telephone Encounter (Signed)
Pt's medication was sent to pt's pharmacy as requested. Confirmation received.  °

## 2020-02-12 DIAGNOSIS — L409 Psoriasis, unspecified: Secondary | ICD-10-CM | POA: Diagnosis not present

## 2020-02-12 DIAGNOSIS — F4321 Adjustment disorder with depressed mood: Secondary | ICD-10-CM | POA: Diagnosis not present

## 2020-02-12 DIAGNOSIS — Z6825 Body mass index (BMI) 25.0-25.9, adult: Secondary | ICD-10-CM | POA: Diagnosis not present

## 2020-02-12 DIAGNOSIS — M810 Age-related osteoporosis without current pathological fracture: Secondary | ICD-10-CM | POA: Diagnosis not present

## 2020-02-12 DIAGNOSIS — Z79899 Other long term (current) drug therapy: Secondary | ICD-10-CM | POA: Diagnosis not present

## 2020-02-12 DIAGNOSIS — R600 Localized edema: Secondary | ICD-10-CM | POA: Diagnosis not present

## 2020-02-12 DIAGNOSIS — M150 Primary generalized (osteo)arthritis: Secondary | ICD-10-CM | POA: Diagnosis not present

## 2020-02-12 DIAGNOSIS — M069 Rheumatoid arthritis, unspecified: Secondary | ICD-10-CM | POA: Diagnosis not present

## 2020-02-12 DIAGNOSIS — M25562 Pain in left knee: Secondary | ICD-10-CM | POA: Diagnosis not present

## 2020-02-12 DIAGNOSIS — E663 Overweight: Secondary | ICD-10-CM | POA: Diagnosis not present

## 2020-02-19 DIAGNOSIS — R110 Nausea: Secondary | ICD-10-CM | POA: Diagnosis not present

## 2020-02-19 DIAGNOSIS — I480 Paroxysmal atrial fibrillation: Secondary | ICD-10-CM | POA: Diagnosis not present

## 2020-02-19 DIAGNOSIS — D72829 Elevated white blood cell count, unspecified: Secondary | ICD-10-CM | POA: Diagnosis not present

## 2020-02-19 DIAGNOSIS — E1140 Type 2 diabetes mellitus with diabetic neuropathy, unspecified: Secondary | ICD-10-CM | POA: Diagnosis not present

## 2020-02-19 DIAGNOSIS — N1832 Chronic kidney disease, stage 3b: Secondary | ICD-10-CM | POA: Diagnosis not present

## 2020-02-19 DIAGNOSIS — Z1152 Encounter for screening for COVID-19: Secondary | ICD-10-CM | POA: Diagnosis not present

## 2020-02-19 DIAGNOSIS — R002 Palpitations: Secondary | ICD-10-CM | POA: Diagnosis not present

## 2020-02-19 DIAGNOSIS — I129 Hypertensive chronic kidney disease with stage 1 through stage 4 chronic kidney disease, or unspecified chronic kidney disease: Secondary | ICD-10-CM | POA: Diagnosis not present

## 2020-02-19 DIAGNOSIS — J069 Acute upper respiratory infection, unspecified: Secondary | ICD-10-CM | POA: Diagnosis not present

## 2020-02-19 DIAGNOSIS — Z794 Long term (current) use of insulin: Secondary | ICD-10-CM | POA: Diagnosis not present

## 2020-02-19 DIAGNOSIS — R059 Cough, unspecified: Secondary | ICD-10-CM | POA: Diagnosis not present

## 2020-02-23 DIAGNOSIS — E1140 Type 2 diabetes mellitus with diabetic neuropathy, unspecified: Secondary | ICD-10-CM | POA: Diagnosis not present

## 2020-02-23 DIAGNOSIS — N1832 Chronic kidney disease, stage 3b: Secondary | ICD-10-CM | POA: Diagnosis not present

## 2020-03-11 DIAGNOSIS — E785 Hyperlipidemia, unspecified: Secondary | ICD-10-CM | POA: Diagnosis not present

## 2020-03-11 DIAGNOSIS — M810 Age-related osteoporosis without current pathological fracture: Secondary | ICD-10-CM | POA: Diagnosis not present

## 2020-03-11 DIAGNOSIS — I6523 Occlusion and stenosis of bilateral carotid arteries: Secondary | ICD-10-CM | POA: Diagnosis not present

## 2020-03-11 DIAGNOSIS — M069 Rheumatoid arthritis, unspecified: Secondary | ICD-10-CM | POA: Diagnosis not present

## 2020-03-11 DIAGNOSIS — I739 Peripheral vascular disease, unspecified: Secondary | ICD-10-CM | POA: Diagnosis not present

## 2020-03-11 DIAGNOSIS — D649 Anemia, unspecified: Secondary | ICD-10-CM | POA: Diagnosis not present

## 2020-03-11 DIAGNOSIS — M5416 Radiculopathy, lumbar region: Secondary | ICD-10-CM | POA: Diagnosis not present

## 2020-03-11 DIAGNOSIS — K760 Fatty (change of) liver, not elsewhere classified: Secondary | ICD-10-CM | POA: Diagnosis not present

## 2020-03-11 DIAGNOSIS — I130 Hypertensive heart and chronic kidney disease with heart failure and stage 1 through stage 4 chronic kidney disease, or unspecified chronic kidney disease: Secondary | ICD-10-CM | POA: Diagnosis not present

## 2020-03-11 DIAGNOSIS — N1832 Chronic kidney disease, stage 3b: Secondary | ICD-10-CM | POA: Diagnosis not present

## 2020-03-11 DIAGNOSIS — E46 Unspecified protein-calorie malnutrition: Secondary | ICD-10-CM | POA: Diagnosis not present

## 2020-03-11 DIAGNOSIS — E1140 Type 2 diabetes mellitus with diabetic neuropathy, unspecified: Secondary | ICD-10-CM | POA: Diagnosis not present

## 2020-04-14 DIAGNOSIS — I130 Hypertensive heart and chronic kidney disease with heart failure and stage 1 through stage 4 chronic kidney disease, or unspecified chronic kidney disease: Secondary | ICD-10-CM | POA: Diagnosis not present

## 2020-04-14 DIAGNOSIS — Z794 Long term (current) use of insulin: Secondary | ICD-10-CM | POA: Diagnosis not present

## 2020-04-14 DIAGNOSIS — N1832 Chronic kidney disease, stage 3b: Secondary | ICD-10-CM | POA: Diagnosis not present

## 2020-04-14 DIAGNOSIS — E1140 Type 2 diabetes mellitus with diabetic neuropathy, unspecified: Secondary | ICD-10-CM | POA: Diagnosis not present

## 2020-05-05 DIAGNOSIS — Z7901 Long term (current) use of anticoagulants: Secondary | ICD-10-CM

## 2020-05-05 DIAGNOSIS — I480 Paroxysmal atrial fibrillation: Secondary | ICD-10-CM

## 2020-05-05 MED ORDER — APIXABAN 2.5 MG PO TABS: ORAL_TABLET | ORAL | 1 refills | Status: DC

## 2020-05-07 ENCOUNTER — Other Ambulatory Visit: Payer: Self-pay

## 2020-05-07 ENCOUNTER — Emergency Department (HOSPITAL_BASED_OUTPATIENT_CLINIC_OR_DEPARTMENT_OTHER): Payer: Medicare Other

## 2020-05-07 ENCOUNTER — Encounter (HOSPITAL_BASED_OUTPATIENT_CLINIC_OR_DEPARTMENT_OTHER): Payer: Self-pay | Admitting: Emergency Medicine

## 2020-05-07 ENCOUNTER — Emergency Department (HOSPITAL_BASED_OUTPATIENT_CLINIC_OR_DEPARTMENT_OTHER)
Admission: EM | Admit: 2020-05-07 | Discharge: 2020-05-07 | Disposition: A | Discharge status: Home / Self Care | Payer: Medicare Other | Attending: Emergency Medicine | Admitting: Emergency Medicine

## 2020-05-07 DIAGNOSIS — Z79899 Other long term (current) drug therapy: Secondary | ICD-10-CM | POA: Insufficient documentation

## 2020-05-07 DIAGNOSIS — I480 Paroxysmal atrial fibrillation: Secondary | ICD-10-CM | POA: Insufficient documentation

## 2020-05-07 DIAGNOSIS — Z23 Encounter for immunization: Secondary | ICD-10-CM | POA: Diagnosis not present

## 2020-05-07 DIAGNOSIS — W25XXXA Contact with sharp glass, initial encounter: Secondary | ICD-10-CM | POA: Diagnosis not present

## 2020-05-07 DIAGNOSIS — I2510 Atherosclerotic heart disease of native coronary artery without angina pectoris: Secondary | ICD-10-CM | POA: Diagnosis not present

## 2020-05-07 DIAGNOSIS — M19041 Primary osteoarthritis, right hand: Secondary | ICD-10-CM | POA: Diagnosis not present

## 2020-05-07 DIAGNOSIS — S6991XA Unspecified injury of right wrist, hand and finger(s), initial encounter: Secondary | ICD-10-CM | POA: Diagnosis not present

## 2020-05-07 DIAGNOSIS — I10 Essential (primary) hypertension: Secondary | ICD-10-CM | POA: Diagnosis not present

## 2020-05-07 DIAGNOSIS — Z7901 Long term (current) use of anticoagulants: Secondary | ICD-10-CM | POA: Diagnosis not present

## 2020-05-07 DIAGNOSIS — S61214A Laceration without foreign body of right ring finger without damage to nail, initial encounter: Secondary | ICD-10-CM | POA: Diagnosis not present

## 2020-05-07 DIAGNOSIS — E119 Type 2 diabetes mellitus without complications: Secondary | ICD-10-CM | POA: Diagnosis not present

## 2020-05-07 DIAGNOSIS — S61212A Laceration without foreign body of right middle finger without damage to nail, initial encounter: Secondary | ICD-10-CM | POA: Insufficient documentation

## 2020-05-07 DIAGNOSIS — Z794 Long term (current) use of insulin: Secondary | ICD-10-CM | POA: Diagnosis not present

## 2020-05-07 DIAGNOSIS — S60511A Abrasion of right hand, initial encounter: Secondary | ICD-10-CM | POA: Insufficient documentation

## 2020-05-07 MED ORDER — LIDOCAINE HCL 2 % IJ SOLN
5.0000 mL | Freq: Once | INTRAMUSCULAR | Status: DC
  Filled 2020-05-07: qty 20

## 2020-05-07 MED ORDER — TETANUS-DIPHTH-ACELL PERTUSSIS 5-2.5-18.5 LF-MCG/0.5 IM SUSY
0.5000 mL | PREFILLED_SYRINGE | Freq: Once | INTRAMUSCULAR | Status: AC
  Administered 2020-05-07: 0.5 mL via INTRAMUSCULAR
  Filled 2020-05-07: qty 0.5

## 2020-05-07 NOTE — ED Provider Notes (Cosign Needed Addendum)
[facility] EMERGENCY DEPARTMENT Provider Note   CSN: [id number] Arrival date & time: 05/07/20  1723     History Chief Complaint  Patient presents with  . Extremity Laceration    Shelby Walker is a 87 y.o. female who presents the emergency department with concern for laceration to her right middle finger and small cuts to her right ring finger and palm after she hit her drinking glass against the counter and have broken her hand.  She states that the wound has persisted and bleeding since that time, she is anticoagulated on Eliquis.  She denies any numbness, tingling, weakness in her hand.  I personally read the patient's medical records.  She is history of rheumatoid arthritis with extensive deformity of her hands.  Additionally she has diabetes, CAD, PAD, hyperlipidemia, atrial fibrillation on Eliquis, and hypertension.  HPI     Past Medical History:  Diagnosis Date  . Bruit    Carotid Doppler showed no significant abnormality     9per patient)  . C. difficile colitis 07/2018   with severe sepsis  . Chest pain, unspecified    Nuclear, May, 2008, no scar or ischemia  . Diabetes mellitus   . Diverticulosis   . Dyslipidemia   . Ejection fraction    EF 55-60%, echo, February, 2011 // Echocardiogram 8/21: EF 60-65, no RWMA, Gr 1 DD, GLS -14%, normal RVSF, mild LAE, trivial MR, mild MS (mean 4 mmHg), RVSP 23.4   . GERD (gastroesophageal reflux disease)   . Mitral regurgitation April 21, 2009   mild,  echo, February, 2011  . Osteoporosis   . Palpitations    possible very brief atrial fibrillation on monitor and possible reentrant tachycardia  . Psoriasis   . Rheumatoid arthritis [facility])     Patient Active Problem List   Diagnosis Date Noted  . Decreased anal sphincter tone 12/13/2018  . 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  . Irritable bowel syndrome with diarrhea - post infectious 12/12/2018  . Full incontinence of feces and fecal smearing 12/12/2018  . Perianal  dermatitis 12/12/2018  . Uncontrolled type 2 diabetes mellitus with hyperglycemia ([place]) 10/26/2018  . Long-term use of immunosuppressant medication-Enbrel 10/26/2018  . 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 Thrombocytopenia ([place])   . Essential hypertension   . Dyspnea on exertion   . Steroid-induced hyperglycemia   . Fatigue 08/06/2018  . Paroxysmal atrial fibrillation ([facility])   . Mixed hyperlipidemia 09/29/2016  . Stable angina ([place]) 09/29/2016  . PAD (peripheral artery disease) ([place]) 08/09/2016  . Preoperative clearance 05/21/2012  . Precordial chest pain   . Palpitations   . Diabetes mellitus ([place])   . GERD (gastroesophageal reflux disease)   . Ejection fraction   . Carotid artery disease ([place])   . Mitral regurgitation 04/21/2009  . PSORIASIS 02/09/2009  . Rheumatoid arthritis flare ([place]) 02/09/2009    Past Surgical History:  Procedure Laterality Date  . FRACTURE SURGERY       OB History    Gravida  2   Para  2   Term      Preterm      AB      Living        SAB      IAB      Ectopic      Multiple      Live Births              Family History  Problem Relation Age of Onset  .  Heart failure Father   . Heart attack Father   . Diabetes Father   . Heart attack Mother   . Heart failure Mother   . Diabetes Mother   . Stroke Sister     Social History   Tobacco Use  . Smoking status: Never Smoker  . Smokeless tobacco: Never Used  Vaping Use  . Vaping Use: Never used  Substance Use Topics  . Alcohol use: No    Alcohol/week: 0.0 standard drinks  . Drug use: No    Home Medications Prior to Admission medications   Medication Sig Start Date End Date Taking? Authorizing Provider  acetaminophen (TYLENOL) 325 MG tablet Take 2 tablets (650 mg total) by mouth every 6 (six) hours as needed for mild pain (or Fever >/= 101). 08/21/18   Angiulli, Lavon Paganini, PA-C  apixaban (ELIQUIS) 2.5 MG TABS tablet Take 1 tablet by mouth 2  times daily 05/05/20   Nahser, Wonda Cheng, MD  atorvastatin (LIPITOR) 20 MG tablet Take 1 tablet (20 mg total) by mouth daily. 11/06/19   End, Harrell Gave, MD  Calcium Carbonate-Vitamin D (CALTRATE 600+D) 600-400 MG-UNIT per tablet Take 1 tablet by mouth daily at 12 noon.     [provider]  carvedilol (COREG) 3.125 MG tablet TAKE 1 TABLET BY MOUTH TWICE DAILY WITH MEALS 09/18/19   Nahser, Wonda Cheng, MD  Cholecalciferol 25 MCG (1000 UT) capsule Take 1 capsule (1,000 Units total) by mouth daily. 08/21/18   Angiulli, Lavon Paganini, PA-C  esomeprazole (NEXIUM) 40 MG capsule Take 1 capsule (40 mg total) by mouth daily at 12 noon. 08/21/18   Angiulli, Lavon Paganini, PA-C  insulin aspart (NOVOLOG) 100 UNIT/ML FlexPen Inject 5 Units into the skin 3 (three) times daily with meals.    [provider]  Insulin Degludec (TRESIBA [place]) Inject 16 Units into the skin at bedtime as needed.    [provider]  Multiple Vitamins-Minerals (CENTRUM SILVER 50+WOMEN PO) Take 1 tablet by mouth daily. 20 mg tablet    [provider]  nitroGLYCERIN (NITROSTAT) 0.4 MG SL tablet Place 1 tablet (0.4 mg total) under the tongue every 5 (five) minutes as needed for chest pain. 02/04/20   Nahser, Wonda Cheng, MD  Nutritional Supplements (ENSURE ACTIVE PO) Patient only drink half of 237MLS  occasionally daily    [provider]  predniSONE (DELTASONE) 5 MG tablet Take 5 mg by mouth daily. 2.5mg  in the morning and 2.5 in the evening 01/24/19   [provider]  RESTASIS 0.05 % ophthalmic emulsion Place 1 drop into both eyes 2 (two) times daily. 02/13/19   [provider]  traMADol (ULTRAM) 50 MG tablet Take 50 mg by mouth daily as needed. 07/01/19   [provider]    Allergies    Cholestyramine, Compazine [prochlorperazine edisylate], and Sulfonamide derivatives  Review of Systems   Review of Systems  Constitutional: Negative.   HENT: Negative.   Respiratory: Negative.    Cardiovascular: Negative.   Gastrointestinal: Negative.   Musculoskeletal: Negative.   Skin: Positive for wound.  Hematological: Bruises/bleeds easily.    Physical Exam Updated Vital Signs BP (!) 147/54 (BP Location: Left Arm)   Pulse 78   Temp 98.2 F (36.8 C) (Oral)   Resp 18   Ht 5' 1.5" (1.562 m)   Wt 58.1 kg   SpO2 98%   BMI 23.79 kg/m   Physical Exam Vitals and nursing note reviewed.  HENT:     Head: Normocephalic and atraumatic.  Eyes:     General: No scleral icterus.       Right eye: No discharge.        Left eye: No discharge.     Conjunctiva/sclera: Conjunctivae normal.  Cardiovascular:     Rate and Rhythm: Normal rate.  Pulmonary:     Effort: Pulmonary effort is normal.  Musculoskeletal:       Hands:     Right lower leg: No edema.     Left lower leg: No edema.     Comments: Symmetric grip strength, normal sensation in all 5 digits of the right hand.   Skin:    General: Skin is warm and dry.     Capillary Refill: Capillary refill takes less than 2 seconds.  Neurological:     General: No focal deficit present.     Mental Status: She is alert and oriented to person, place, and time.     Sensory: Sensation is intact.     Motor: Motor function is intact.  Psychiatric:        Mood and Affect: Mood normal.     ED Results / Procedures / Treatments   Labs (all labs ordered are listed, but only abnormal results are displayed) Labs Reviewed - No data to display  EKG None  Radiology DG Hand Complete Right  Result Date: 05/07/2020 CLINICAL DATA:  Broken glass EXAM: RIGHT HAND - COMPLETE 3+ VIEW COMPARISON:  None. FINDINGS: Osteopenia. Advanced changes consistent with history of rheumatoid arthritis with ulnar subluxation of the MCP joints, advanced joint space narrowing of the radiocarpal joints, widening of the scapholunate interval, and joint space narrowing throughout the MCPs, DIPs and PIPs. Vascular calcifications. No definitive acute fracture or  dislocation. No definitive radiopaque retained foreign bodies. IMPRESSION: 1. Advanced inflammatory arthropathy consistent with history of rheumatoid arthritis. 2. No definitive radiopaque retained foreign bodies. Electronically Signed   By: Valentino Saxon MD   On: 05/07/2020 19:30    Procedures .Marland KitchenLaceration Repair  Date/Time: 05/07/2020 7:47 PM Performed by: Emeline Darling, PA-C Authorized by: Emeline Darling, PA-C   Consent:    Consent obtained:  Verbal   Consent given by:  Patient   Risks discussed:  Infection, need for additional repair, pain, poor cosmetic result and poor wound healing   Alternatives discussed:  No treatment and delayed treatment Universal protocol:    Procedure explained and questions answered to patient or proxy's satisfaction: yes     Relevant documents present and verified: yes     Test results available: yes     Imaging studies available: yes     Required blood products, implants, devices, and special equipment available: yes     Site/side marked: yes     Immediately prior to procedure, a time out was called: yes     Patient identity confirmed:  Verbally with patient Anesthesia:    Anesthesia method:  Local infiltration   Local anesthetic:  Lidocaine 2% w/o epi Laceration details:    Location:  Finger   Finger location:  R long finger   Length (cm):  1.5 Exploration:    Limited defect created (wound extended): no     Hemostasis achieved with:  Direct pressure   Imaging obtained: x-ray     Wound extent: no foreign bodies/material noted, no tendon damage noted and no underlying fracture noted     Contaminated: no   Treatment:    Area cleansed with:  Saline   Amount of cleaning:  Standard   Irrigation  solution:  Sterile saline   Irrigation method:  Pressure wash   Debridement:  None Skin repair:    Repair method:  Sutures and tissue adhesive (Tissue adhesive used to close small laceration on the right ring finger.)   Suture size:   5-0   Suture material:  Prolene   Suture technique:  Simple interrupted   Number of sutures:  2 Approximation:    Approximation:  Close Repair type:    Repair type:  Simple Post-procedure details:    Dressing:  Antibiotic ointment and non-adherent dressing   Procedure completion:  Tolerated well, no immediate complications     Medications Ordered in ED Medications  lidocaine (XYLOCAINE) 2 % (with pres) injection 100 mg (100 mg Intradermal Not Given 05/07/20 1929)  Tdap (BOOSTRIX) injection 0.5 mL (has no administration in time range)    ED Course  I have reviewed the triage vital signs and the nursing notes.  Pertinent labs & imaging results that were available during my care of the patient were reviewed by me and considered in my medical decision making (see chart for details).    MDM Rules/Calculators/A&P                         87 year old female on Eliquis who presents with concern for laceration to her right middle finger and persistent bleeding.  Mildly hypertensive on intake.  Vital signs otherwise normal.  Physical exam revealed laceration to the right middle finger amenable to repair with suture, with brisk blood flow at the time of my initial exam.  Very small laceration to the right ring finger, amenable to repair with tissue adhesive.  Plain film of the hand did not reveal any fractures or retained foreign bodies.  Laceration repaired as above, with hemostasis obtained.  Patient tolerated procedure well without any complications.  Antibiotic ointment and nonadhesive dressing applied to the area. Tetanus updated.  Given reassuring physical exam, no further work-up is warranted in the ED at this time.  Patient should return for medical evaluation and suture removal in 7 to 10 days.  [place] voiced understanding of her medical evaluation and treatment plan.  Each of her questions was answered to her expressed satisfaction.  Return precautions given.  Patient is well-appearing,  stable, and appropriate for discharge at this time.  This chart was dictated using voice recognition software, Dragon. Despite the best efforts of this provider to proofread and correct errors, errors may still occur which can change documentation meaning.  Final Clinical Impression(s) / ED Diagnoses Final diagnoses:  Laceration of right middle finger without foreign body without damage to nail, initial encounter    Rx / DC Orders ED Discharge Orders    None       Emeline Darling, PA-C 05/07/20 [address], Gypsy Balsam, PA-C 05/07/20 2018    Lucrezia Starch, MD 05/09/20 [id number]

## 2020-05-07 NOTE — ED Triage Notes (Signed)
Reports she went to return a glass to the counter when she hit the counter causing the glass to bust.  Lac noted to palm of right hand and to right middle finger.

## 2020-05-07 NOTE — Discharge Instructions (Addendum)
Your wound was repaired with 2 sutures in the ER today. You will need to have them taken out in 7-10 days. This can be done at your primary care doctor, the ER, or urgent care.   You may place a clean bandage on it daily with antibiotic ointment.   Return to the ER if you develop any swelling, redness, or drainage from your wound other than blood, if you develop redness streaking up your hand, fevers, chills or any other new severe symptoms.

## 2020-05-12 DIAGNOSIS — Z79899 Other long term (current) drug therapy: Secondary | ICD-10-CM | POA: Diagnosis not present

## 2020-05-12 DIAGNOSIS — M25562 Pain in left knee: Secondary | ICD-10-CM | POA: Diagnosis not present

## 2020-05-12 DIAGNOSIS — L409 Psoriasis, unspecified: Secondary | ICD-10-CM | POA: Diagnosis not present

## 2020-05-12 DIAGNOSIS — M069 Rheumatoid arthritis, unspecified: Secondary | ICD-10-CM | POA: Diagnosis not present

## 2020-05-12 DIAGNOSIS — M150 Primary generalized (osteo)arthritis: Secondary | ICD-10-CM | POA: Diagnosis not present

## 2020-05-12 DIAGNOSIS — F4321 Adjustment disorder with depressed mood: Secondary | ICD-10-CM | POA: Diagnosis not present

## 2020-05-12 DIAGNOSIS — M810 Age-related osteoporosis without current pathological fracture: Secondary | ICD-10-CM | POA: Diagnosis not present

## 2020-05-12 DIAGNOSIS — Z6824 Body mass index (BMI) 24.0-24.9, adult: Secondary | ICD-10-CM | POA: Diagnosis not present

## 2020-05-12 DIAGNOSIS — R600 Localized edema: Secondary | ICD-10-CM | POA: Diagnosis not present

## 2020-05-12 DIAGNOSIS — M7062 Trochanteric bursitis, left hip: Secondary | ICD-10-CM | POA: Diagnosis not present

## 2020-05-16 DIAGNOSIS — I480 Paroxysmal atrial fibrillation: Secondary | ICD-10-CM | POA: Diagnosis not present

## 2020-05-16 DIAGNOSIS — Z4802 Encounter for removal of sutures: Secondary | ICD-10-CM | POA: Diagnosis not present

## 2020-05-16 DIAGNOSIS — S61212A Laceration without foreign body of right middle finger without damage to nail, initial encounter: Secondary | ICD-10-CM | POA: Diagnosis not present

## 2020-05-16 DIAGNOSIS — R5383 Other fatigue: Secondary | ICD-10-CM | POA: Diagnosis not present

## 2020-05-16 DIAGNOSIS — I130 Hypertensive heart and chronic kidney disease with heart failure and stage 1 through stage 4 chronic kidney disease, or unspecified chronic kidney disease: Secondary | ICD-10-CM | POA: Diagnosis not present

## 2020-05-16 DIAGNOSIS — N1832 Chronic kidney disease, stage 3b: Secondary | ICD-10-CM | POA: Diagnosis not present

## 2020-05-16 DIAGNOSIS — R630 Anorexia: Secondary | ICD-10-CM | POA: Diagnosis not present

## 2020-05-26 NOTE — Telephone Encounter (Signed)
**Note De-Identified  Obfuscation** Letter received from [facility] stating that they approved the pt for asst with Eliquis until 03/04/2021. BMSPAF Case #: [id number]  The letter states that they have notified thept of this approval as well.

## 2020-06-18 ENCOUNTER — Other Ambulatory Visit: Payer: Self-pay | Admitting: Internal Medicine

## 2020-06-18 DIAGNOSIS — I739 Peripheral vascular disease, unspecified: Secondary | ICD-10-CM

## 2020-06-18 DIAGNOSIS — E782 Mixed hyperlipidemia: Secondary | ICD-10-CM

## 2020-07-07 DIAGNOSIS — D649 Anemia, unspecified: Secondary | ICD-10-CM | POA: Diagnosis not present

## 2020-07-07 DIAGNOSIS — R0781 Pleurodynia: Secondary | ICD-10-CM | POA: Diagnosis not present

## 2020-07-07 DIAGNOSIS — K219 Gastro-esophageal reflux disease without esophagitis: Secondary | ICD-10-CM | POA: Diagnosis not present

## 2020-07-07 DIAGNOSIS — N1832 Chronic kidney disease, stage 3b: Secondary | ICD-10-CM | POA: Diagnosis not present

## 2020-07-07 DIAGNOSIS — I130 Hypertensive heart and chronic kidney disease with heart failure and stage 1 through stage 4 chronic kidney disease, or unspecified chronic kidney disease: Secondary | ICD-10-CM | POA: Diagnosis not present

## 2020-07-07 DIAGNOSIS — M25561 Pain in right knee: Secondary | ICD-10-CM | POA: Diagnosis not present

## 2020-07-11 ENCOUNTER — Other Ambulatory Visit: Payer: Medicare Other | Admitting: *Deleted

## 2020-07-11 ENCOUNTER — Other Ambulatory Visit: Payer: Self-pay

## 2020-07-11 DIAGNOSIS — Z7901 Long term (current) use of anticoagulants: Secondary | ICD-10-CM

## 2020-07-11 DIAGNOSIS — E119 Type 2 diabetes mellitus without complications: Secondary | ICD-10-CM | POA: Diagnosis not present

## 2020-07-11 DIAGNOSIS — I480 Paroxysmal atrial fibrillation: Secondary | ICD-10-CM

## 2020-07-11 LAB — BASIC METABOLIC PANEL
BUN/Creatinine Ratio: 42 — ABNORMAL HIGH (ref 12–28)
BUN: 49 mg/dL — ABNORMAL HIGH (ref 8–27)
CO2: 23 mmol/L (ref 20–29)
Calcium: 8.5 mg/dL — ABNORMAL LOW (ref 8.7–10.3)
Chloride: 103 mmol/L (ref 96–106)
Creatinine, Ser: 1.18 mg/dL — ABNORMAL HIGH (ref 0.57–1.00)
Glucose: 198 mg/dL — ABNORMAL HIGH (ref 65–99)
Potassium: 5.2 mmol/L (ref 3.5–5.2)
Sodium: 139 mmol/L (ref 134–144)
eGFR: 45 mL/min/{1.73_m2} — ABNORMAL LOW (ref >59)

## 2020-07-11 LAB — CBC
Hematocrit: 31.7 % — ABNORMAL LOW (ref 34.0–46.6)
Hemoglobin: 10.4 g/dL — ABNORMAL LOW (ref 11.1–15.9)
MCH: 28.9 pg (ref 26.6–33.0)
MCHC: 32.8 g/dL (ref 31.5–35.7)
MCV: 88 fL (ref 79–97)
Platelets: 222 10*3/uL (ref 150–450)
RBC: 3.6 x10E6/uL — ABNORMAL LOW (ref 3.77–5.28)
RDW: 13 % (ref 11.7–15.4)
WBC: 9.5 10*3/uL (ref 3.4–10.8)

## 2020-07-13 DIAGNOSIS — I700 Atherosclerosis of aorta: Secondary | ICD-10-CM | POA: Diagnosis not present

## 2020-07-13 DIAGNOSIS — I6523 Occlusion and stenosis of bilateral carotid arteries: Secondary | ICD-10-CM | POA: Diagnosis not present

## 2020-07-13 DIAGNOSIS — D649 Anemia, unspecified: Secondary | ICD-10-CM | POA: Diagnosis not present

## 2020-07-13 DIAGNOSIS — I679 Cerebrovascular disease, unspecified: Secondary | ICD-10-CM | POA: Diagnosis not present

## 2020-07-13 DIAGNOSIS — I130 Hypertensive heart and chronic kidney disease with heart failure and stage 1 through stage 4 chronic kidney disease, or unspecified chronic kidney disease: Secondary | ICD-10-CM | POA: Diagnosis not present

## 2020-07-13 DIAGNOSIS — E785 Hyperlipidemia, unspecified: Secondary | ICD-10-CM | POA: Diagnosis not present

## 2020-07-13 DIAGNOSIS — I129 Hypertensive chronic kidney disease with stage 1 through stage 4 chronic kidney disease, or unspecified chronic kidney disease: Secondary | ICD-10-CM | POA: Diagnosis not present

## 2020-07-13 DIAGNOSIS — N1832 Chronic kidney disease, stage 3b: Secondary | ICD-10-CM | POA: Diagnosis not present

## 2020-07-13 DIAGNOSIS — I480 Paroxysmal atrial fibrillation: Secondary | ICD-10-CM | POA: Diagnosis not present

## 2020-07-13 DIAGNOSIS — Z794 Long term (current) use of insulin: Secondary | ICD-10-CM | POA: Diagnosis not present

## 2020-07-13 DIAGNOSIS — E1140 Type 2 diabetes mellitus with diabetic neuropathy, unspecified: Secondary | ICD-10-CM | POA: Diagnosis not present

## 2020-07-13 DIAGNOSIS — M810 Age-related osteoporosis without current pathological fracture: Secondary | ICD-10-CM | POA: Diagnosis not present

## 2020-07-27 ENCOUNTER — Telehealth: Payer: Self-pay | Admitting: Cardiovascular Disease

## 2020-07-27 DIAGNOSIS — I480 Paroxysmal atrial fibrillation: Secondary | ICD-10-CM

## 2020-07-27 NOTE — Telephone Encounter (Signed)
New Message:    Pt 's primary doctor thought pt should be seen before her appt in August. Pt have had a few episodes of heart beating fast- not at this time   Patient c/o Palpitations:  High priority if patient c/o lightheadedness, shortness of breath, or chest pain  1) How long have you had palpitations/irregular HR/ Afib? Are you having the symptoms now pt had 2 or 3 episodes of pt's heart beating fast 2)  3)  you currently experiencing lightheadedness, SOB or CP?had some chest pain when this happen, also more tired than normal  4) Do you have a history of afib (atrial fibrillation) or irregular heart rhythm? Yes  5) Have you checked your BP or HR? (document readings if available): slightly elevated 6)  7) Are you experiencing any other symptoms? No-doctor wanted her to have sooner appt thanAugust- First available was 09-21-20- please call to evaluate

## 2020-07-27 NOTE — Telephone Encounter (Signed)
Spent about 17 minutes on the phone with pt and dtr. Dtr reports pt was at PCP on 5/11 and mentioned she had a couple of episodes where her heart was beating fast in the last couple months. No episodes recently/this month. When she does experience episodes they last about 30 minutes. Dtr has noticed mom has been "so so tired over the last few months". No other symptoms but pt is "just wiped out after episode". Unknown how high HRs are going but she can feel it racing. Pt has been taking nitro during episodes d/t chest discomfort/pain.  She does reports that only occurs when heart racing.  Relieved pain first two episodes but did not relieve it the last time. Current BP is 122/60. We discussed AFib, nitro, afib triggers, medication and importance of continuing Eliquis.  Pt thought the nitro would help -- educated to nitro and its actions.  Advised that although it won't hurt her to take it, that it would not help her AFib or HRs.   Pt and dtr both we very appreciative of taking the time to talk with them and the education given. Aware that will forward to [facility], RN to discuss plan with Dr. Acie Fredrickson.  Aware she will follow up with them by next week, as Nahser is out of the office this week.  Aware he may recommend f/u in the AFib clinic, may discuss an as needed medication for infrequency episodes, may recommend f/u in our office. advised to call office if Afib becomes an issue before RN calls them back. Both dtr and pt agreeable to plan.

## 2020-07-27 NOTE — Telephone Encounter (Signed)
Set pt up for a 4 wk monitor to evaluate for atrial fib/arrhythmia

## 2020-07-28 DIAGNOSIS — I480 Paroxysmal atrial fibrillation: Secondary | ICD-10-CM | POA: Diagnosis not present

## 2020-07-28 NOTE — Telephone Encounter (Signed)
RN spoke to daughter [name] [facility]) to explain recommendation of 30-day monitor per Dr.Ross. Daughter in agreement and verbalized understanding. RN advised monitor would be mailed to patient.

## 2020-08-05 ENCOUNTER — Ambulatory Visit (INDEPENDENT_AMBULATORY_CARE_PROVIDER_SITE_OTHER): Payer: Medicare Other

## 2020-08-05 DIAGNOSIS — I480 Paroxysmal atrial fibrillation: Secondary | ICD-10-CM

## 2020-08-11 ENCOUNTER — Other Ambulatory Visit: Payer: Self-pay

## 2020-08-11 NOTE — Telephone Encounter (Signed)
ERROR

## 2020-08-17 DIAGNOSIS — F4321 Adjustment disorder with depressed mood: Secondary | ICD-10-CM | POA: Diagnosis not present

## 2020-08-17 DIAGNOSIS — R600 Localized edema: Secondary | ICD-10-CM | POA: Diagnosis not present

## 2020-08-17 DIAGNOSIS — M25562 Pain in left knee: Secondary | ICD-10-CM | POA: Diagnosis not present

## 2020-08-17 DIAGNOSIS — M7062 Trochanteric bursitis, left hip: Secondary | ICD-10-CM | POA: Diagnosis not present

## 2020-08-17 DIAGNOSIS — M810 Age-related osteoporosis without current pathological fracture: Secondary | ICD-10-CM | POA: Diagnosis not present

## 2020-08-17 DIAGNOSIS — Z79899 Other long term (current) drug therapy: Secondary | ICD-10-CM | POA: Diagnosis not present

## 2020-08-17 DIAGNOSIS — M150 Primary generalized (osteo)arthritis: Secondary | ICD-10-CM | POA: Diagnosis not present

## 2020-08-17 DIAGNOSIS — L409 Psoriasis, unspecified: Secondary | ICD-10-CM | POA: Diagnosis not present

## 2020-08-17 DIAGNOSIS — Z6823 Body mass index (BMI) 23.0-23.9, adult: Secondary | ICD-10-CM | POA: Diagnosis not present

## 2020-08-17 DIAGNOSIS — M069 Rheumatoid arthritis, unspecified: Secondary | ICD-10-CM | POA: Diagnosis not present

## 2020-08-30 DIAGNOSIS — L409 Psoriasis, unspecified: Secondary | ICD-10-CM | POA: Diagnosis not present

## 2020-08-30 DIAGNOSIS — M810 Age-related osteoporosis without current pathological fracture: Secondary | ICD-10-CM | POA: Diagnosis not present

## 2020-08-30 DIAGNOSIS — M25562 Pain in left knee: Secondary | ICD-10-CM | POA: Diagnosis not present

## 2020-08-30 DIAGNOSIS — M150 Primary generalized (osteo)arthritis: Secondary | ICD-10-CM | POA: Diagnosis not present

## 2020-08-30 DIAGNOSIS — Z6823 Body mass index (BMI) 23.0-23.9, adult: Secondary | ICD-10-CM | POA: Diagnosis not present

## 2020-08-30 DIAGNOSIS — M069 Rheumatoid arthritis, unspecified: Secondary | ICD-10-CM | POA: Diagnosis not present

## 2020-08-30 DIAGNOSIS — R600 Localized edema: Secondary | ICD-10-CM | POA: Diagnosis not present

## 2020-08-30 DIAGNOSIS — Z79899 Other long term (current) drug therapy: Secondary | ICD-10-CM | POA: Diagnosis not present

## 2020-08-30 DIAGNOSIS — M7062 Trochanteric bursitis, left hip: Secondary | ICD-10-CM | POA: Diagnosis not present

## 2020-08-30 DIAGNOSIS — F4321 Adjustment disorder with depressed mood: Secondary | ICD-10-CM | POA: Diagnosis not present

## 2020-09-06 DIAGNOSIS — M19079 Primary osteoarthritis, unspecified ankle and foot: Secondary | ICD-10-CM | POA: Diagnosis not present

## 2020-09-06 DIAGNOSIS — I739 Peripheral vascular disease, unspecified: Secondary | ICD-10-CM | POA: Diagnosis not present

## 2020-09-06 DIAGNOSIS — L4050 Arthropathic psoriasis, unspecified: Secondary | ICD-10-CM | POA: Diagnosis not present

## 2020-09-06 DIAGNOSIS — E1140 Type 2 diabetes mellitus with diabetic neuropathy, unspecified: Secondary | ICD-10-CM | POA: Diagnosis not present

## 2020-09-06 DIAGNOSIS — M069 Rheumatoid arthritis, unspecified: Secondary | ICD-10-CM | POA: Diagnosis not present

## 2020-09-07 DIAGNOSIS — M19079 Primary osteoarthritis, unspecified ankle and foot: Secondary | ICD-10-CM | POA: Diagnosis not present

## 2020-09-07 DIAGNOSIS — E1140 Type 2 diabetes mellitus with diabetic neuropathy, unspecified: Secondary | ICD-10-CM | POA: Diagnosis not present

## 2020-09-07 DIAGNOSIS — N1832 Chronic kidney disease, stage 3b: Secondary | ICD-10-CM | POA: Diagnosis not present

## 2020-09-07 DIAGNOSIS — Z794 Long term (current) use of insulin: Secondary | ICD-10-CM | POA: Diagnosis not present

## 2020-09-07 DIAGNOSIS — I130 Hypertensive heart and chronic kidney disease with heart failure and stage 1 through stage 4 chronic kidney disease, or unspecified chronic kidney disease: Secondary | ICD-10-CM | POA: Diagnosis not present

## 2020-09-07 DIAGNOSIS — E785 Hyperlipidemia, unspecified: Secondary | ICD-10-CM | POA: Diagnosis not present

## 2020-09-10 DIAGNOSIS — Z20822 Contact with and (suspected) exposure to covid-19: Secondary | ICD-10-CM | POA: Diagnosis not present

## 2020-09-19 DIAGNOSIS — M069 Rheumatoid arthritis, unspecified: Secondary | ICD-10-CM | POA: Diagnosis not present

## 2020-09-21 ENCOUNTER — Encounter: Payer: Self-pay | Admitting: Nurse Practitioner

## 2020-09-21 ENCOUNTER — Encounter: Payer: Self-pay | Admitting: Cardiovascular Disease

## 2020-09-21 ENCOUNTER — Other Ambulatory Visit: Payer: Self-pay

## 2020-09-21 ENCOUNTER — Ambulatory Visit (INDEPENDENT_AMBULATORY_CARE_PROVIDER_SITE_OTHER): Payer: Medicare Other | Admitting: Cardiovascular Disease

## 2020-09-21 VITALS — BP 138/66 | HR 71 | Ht 61.5 in | Wt 125.6 lb

## 2020-09-21 DIAGNOSIS — I700 Atherosclerosis of aorta: Secondary | ICD-10-CM

## 2020-09-21 DIAGNOSIS — Z7901 Long term (current) use of anticoagulants: Secondary | ICD-10-CM | POA: Diagnosis not present

## 2020-09-21 DIAGNOSIS — R0609 Other forms of dyspnea: Secondary | ICD-10-CM

## 2020-09-21 DIAGNOSIS — I480 Paroxysmal atrial fibrillation: Secondary | ICD-10-CM

## 2020-09-21 DIAGNOSIS — I739 Peripheral vascular disease, unspecified: Secondary | ICD-10-CM

## 2020-09-21 DIAGNOSIS — I6523 Occlusion and stenosis of bilateral carotid arteries: Secondary | ICD-10-CM

## 2020-09-21 DIAGNOSIS — R0600 Dyspnea, unspecified: Secondary | ICD-10-CM | POA: Diagnosis not present

## 2020-09-21 NOTE — Patient Instructions (Signed)
Medication Instructions:  Your physician recommends that you continue on your current medications as directed. Please refer to the Current Medication list given to you today.  *If you need a refill on your cardiac medications before your next appointment, please call your pharmacy*   Lab Work: None Ordered If you have labs (blood work) drawn today and your tests are completely normal, you will receive your results only by: [place] (if you have MyChart) OR A paper copy in the mail If you have any lab test that is abnormal or we need to change your treatment, we will call you to review the results.   Testing/Procedures: Your physician has requested that you have a lexiscan myoview. For further information please visit [email or web address]. Please follow instruction sheet, as given.    Follow-Up: At [facility] you and your health needs are our priority.  As part of our continuing mission to provide you with exceptional heart care, we have created designated Provider Care Teams.  These Care Teams include your primary Cardiologist (physician) and Advanced Practice Providers (APPs -  Physician Assistants and Nurse Practitioners) who all work together to provide you with the care you need, when you need it.   Your next appointment:   6 month(s)  The format for your next appointment:   In Person  Provider:   You will see one of the following Advanced Practice Providers on your designated Care Team:   Richardson Dopp, PA-C [place], [place]

## 2020-09-21 NOTE — Progress Notes (Signed)
Cardiology Office Note   Date:  09/21/2020   ID:  Shelby Walker, DOB 1933/05/02, MRN [id number]  PCP:  Reynold Bowen, MD  Cardiologist:  Mertie Moores, MD  ( transfer from Dr. Ron Parker)   Chief Complaint  Patient presents with   Atrial Fibrillation    follow-up palpitations and some chest discomfort in the past     Shelby Walker is a 87 y.o. female who presents today to follow-up a history of mild chest discomfort and palpitations in the past. She is feeling well and not having any significant symptoms.  Recently she had a mild case of shingles. She also had an upper respiratory infection that is being treated and improving.  Dec 09, 2015:  Shelby Walker is seen today for the first time - transfer from Ron Parker Followed for palpitations with some associated chest pain  Stays active,  Does yard work.   These can last as long as 30 minutes .   No CP or palpitations with yard work . Typically occur at night  The CP starts first and then she has the palpitations  No recent myoview No recent monitor   August 09, 2016:  Shelby Walker is seen for follow up of her carotid disease  She had been having some chest pain . Myoview was low risk.  Still very active,  No further CP .   Has moderate carotid disease Has some burning in her legs with walking and at night   May 01, 2017.  Shelby Walker  is seen today for follow-up of her claudication and episodes of chest pain.3 She is getting over pneumonia .   Leg pain / claudication  has improved.   Has seen Dr. Saunders Revel for PAD .  She was found to have noncritical peripheral arterial disease.  She has not wanted to have any procedure and at this point she has not needed it. Dr. Forde Dandy follows / manages her lipids and diabetes.  Will request labs from recent visit   Oct. 2, 2019:  Shelby Walker is seen today .  Seen with daughter , [name]  Had an episode of syncope several weeks ago ( was very hot , standing for 45 min at a funeral )  Had a mild episode of  lightheadeeness yesterday  Admits that she does eat well or drink enough Associated with a fast HR ( felt that her heart was pounding )  Has mild carotid disease. Scheduled to have repeat carotid duplex later this month  Jan. 14, 2021 Shelby Walker is seen today for follow up of her hyperlipidemia, diabetes mellitus and chest pain and PAF  Her husband passed away this past 06/07/22  No CP or dyspnea.  No syncope  Has been found to have atrial fib and is on eliquis   October 16, 2019: Shelby Walker is seen today. She was seen by Richardson Dopp, PA on July 7 for increasing shortness of breath, fatigue. Echocardiogram revealed normal left ventricular systolic function-ejection fraction is 60 to 65%..  She has mild diastolic dysfunction. Lower extremity duplex scan reveals stable bilateral peripheral arterial disease that is unchanged from previous study. She is scheduled for Lexiscan Myoview study next week.  She does not want to have the stress test.   September 21, 2020  Shelby Walker is seen for follow up for her chronic diastolic dysfunction, HLD , DM  Has claudication and has seen Dr. Saunders Revel. Had some chest pain this past 06-07-22 night Tried a SL NTG - did not help  Found her glucose to be low , ( Freestyle [place] )  took a glucose tab Passed out , woke up on the ground ( I suspect from her hypoglycemia )  Had difficulty getting up  Event monitor was unremarkable   Past Medical History:  Diagnosis Date   Bruit    Carotid Doppler showed no significant abnormality     9per patient)   C. difficile colitis 07/2018   with severe sepsis   Chest pain, unspecified    Nuclear, May, 2008, no scar or ischemia   Diabetes mellitus    Diverticulosis    Dyslipidemia    Ejection fraction    EF 55-60%, echo, February, 2011 // Echocardiogram 8/21: EF 60-65, no RWMA, Gr 1 DD, GLS -14%, normal RVSF, mild LAE, trivial MR, mild Shelby (mean 4 mmHg), RVSP 23.4    GERD (gastroesophageal reflux disease)    Mitral regurgitation  April 21, 2009   mild,  echo, February, 2011   Osteoporosis    Palpitations    possible very brief atrial fibrillation on monitor and possible reentrant tachycardia   Psoriasis    Rheumatoid arthritis ([place])     Past Surgical History:  Procedure Laterality Date   FRACTURE SURGERY      Patient Active Problem List   Diagnosis Date Noted   Paroxysmal atrial fibrillation [facility])     Priority: High   Decreased anal sphincter tone 12/13/2018   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   Irritable bowel syndrome with diarrhea - post infectious 12/12/2018   Full incontinence of feces and fecal smearing 12/12/2018   Perianal dermatitis 12/12/2018   Uncontrolled type 2 diabetes mellitus with hyperglycemia ([place]) 10/26/2018   Long-term use of immunosuppressant medication-Enbrel 10/26/2018   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 08/27/2018   Thrombocytopenia (HCC)    Essential hypertension    Dyspnea on exertion    Steroid-induced hyperglycemia    Fatigue 08/06/2018   Mixed hyperlipidemia 09/29/2016   Stable angina ([place]) 09/29/2016   PAD (peripheral artery disease) ([place]) 08/09/2016   Preoperative clearance 05/21/2012   Precordial chest pain    Palpitations    Diabetes mellitus (HCC)    GERD (gastroesophageal reflux disease)    Ejection fraction    Carotid artery disease (HCC)    Mitral regurgitation 04/21/2009   PSORIASIS 02/09/2009   Rheumatoid arthritis flare ([place]) 02/09/2009      Current Outpatient Medications  Medication Sig Dispense Refill   acetaminophen (TYLENOL) 325 MG tablet Take 2 tablets (650 mg total) by mouth every 6 (six) hours as needed for mild pain (or Fever >/= 101).     apixaban (ELIQUIS) 2.5 MG TABS tablet Take 1 tablet by mouth 2 times daily 60 tablet 1   atorvastatin (LIPITOR) 20 MG tablet Take 1 tablet by mouth once daily 90 tablet 0   Calcium Carbonate-Vitamin D 600-400 MG-UNIT tablet Take 1 tablet by mouth daily at 12 noon.      carvedilol (COREG) 3.125  MG tablet TAKE 1 TABLET BY MOUTH TWICE DAILY WITH MEALS 180 tablet 3   Cholecalciferol 25 MCG (1000 UT) capsule Take 1 capsule (1,000 Units total) by mouth daily. 30 capsule 0   esomeprazole (NEXIUM) 40 MG capsule Take 1 capsule (40 mg total) by mouth daily at 12 noon. 30 capsule 0   insulin aspart (NOVOLOG) 100 UNIT/ML FlexPen Inject 6 Units into the skin 3 (three) times daily with meals.     Insulin Degludec (TRESIBA [place]) Inject 8 Units into the skin at bedtime  as needed.     Multiple Vitamins-Minerals (CENTRUM SILVER 50+WOMEN PO) Take 1 tablet by mouth daily. 20 mg tablet     nitroGLYCERIN (NITROSTAT) 0.4 MG SL tablet Place 1 tablet (0.4 mg total) under the tongue every 5 (five) minutes as needed for chest pain. 25 tablet 6   Nutritional Supplements (ENSURE ACTIVE PO) Patient only drink half of 237MLS  occasionally daily     predniSONE (DELTASONE) 5 MG tablet Take 4 mg by mouth daily. 4mg  in the morning and 2.5 in the evening     RESTASIS 0.05 % ophthalmic emulsion Place 1 drop into both eyes 2 (two) times daily.     traMADol (ULTRAM) 50 MG tablet Take 50 mg by mouth daily as needed.     No current facility-administered medications for this visit.    Allergies:   Cholestyramine, Compazine [prochlorperazine edisylate], and Sulfonamide derivatives    Social History:  The patient  reports that she has never smoked. She has never used smokeless tobacco. She reports that she does not drink alcohol and does not use drugs.   Family History:  The patient's family history includes Diabetes in her father and mother; Heart attack in her father and mother; Heart failure in her father and mother; Stroke in her sister.    ROS: As per current history.  Other systems are negative.   Physical Exam: Blood pressure 138/66, pulse 71, height 5' 1.5" (1.562 m), weight 125 lb 9.6 oz (57 kg), SpO2 98 %.  GEN:  Well nourished, well developed in no acute distress HEENT: Normal NECK: No JVD; No carotid  bruits LYMPHATICS: No lymphadenopathy CARDIAC: RRR , no murmurs, rubs, gallops RESPIRATORY:  Clear to auscultation without rales, wheezing or rhonchi  ABDOMEN: Soft, non-tender, non-distended MUSCULOSKELETAL:  No edema; No deformity  SKIN: Warm and dry NEUROLOGIC:  Alert and oriented x 3   EKG:  September 21, 2020:  NSR at 71,  no ST or T wave abn   Recent Labs: 07/11/2020: BUN 49; Creatinine, Ser 1.18; Hemoglobin 10.4; Platelets 222; Potassium 5.2; Sodium 139    Lipid Panel    Component Value Date/Time   CHOL 129 12/24/2016 1131   TRIG 151 (H) 12/24/2016 1131   HDL 52 12/24/2016 1131   CHOLHDL 2.5 12/24/2016 1131   LDLCALC 47 12/24/2016 1131      Wt Readings from Last 3 Encounters:  09/21/20 125 lb 9.6 oz (57 kg)  05/07/20 128 lb (58.1 kg)  10/21/19 135 lb (61.2 kg)      Current medicines are reviewed  The patient understands her medications well.     ASSESSMENT AND PLAN:  1. Atrial fib :  Paroxysmal .  Remains in sinus now    2. Left carotid bruit: -     stable .  3. Claudication:  She states that it is very difficult for her to get to our [place] office.  She has to walk for long distance.She would like to consider changing to one of the other doctors who has an office at [facility].  I have advised her to discuss this with Dr. Saunders Revel.    4.  Generalized weakness.   . 5.. chest pain :  some chest pain last week.    We had scheduled a myoview last year - she did not do Will schedule it now    Mertie Moores, MD  09/21/2020 11:46 AM    [facility] [address]  [place], [place]  [address] Pager [place] Phone: [phone]; Fax: [phone]

## 2020-09-22 ENCOUNTER — Other Ambulatory Visit: Payer: Self-pay | Admitting: Internal Medicine

## 2020-09-22 ENCOUNTER — Other Ambulatory Visit: Payer: Self-pay | Admitting: Cardiovascular Disease

## 2020-09-22 DIAGNOSIS — I739 Peripheral vascular disease, unspecified: Secondary | ICD-10-CM

## 2020-09-22 DIAGNOSIS — E782 Mixed hyperlipidemia: Secondary | ICD-10-CM

## 2020-09-22 NOTE — Telephone Encounter (Signed)
Pt changed to Dr. Acie Fredrickson [place].

## 2020-09-23 DIAGNOSIS — M19079 Primary osteoarthritis, unspecified ankle and foot: Secondary | ICD-10-CM | POA: Diagnosis not present

## 2020-09-23 DIAGNOSIS — W19XXXA Unspecified fall, initial encounter: Secondary | ICD-10-CM | POA: Diagnosis not present

## 2020-09-23 DIAGNOSIS — R0781 Pleurodynia: Secondary | ICD-10-CM | POA: Diagnosis not present

## 2020-09-23 DIAGNOSIS — N1832 Chronic kidney disease, stage 3b: Secondary | ICD-10-CM | POA: Diagnosis not present

## 2020-09-23 DIAGNOSIS — M25511 Pain in right shoulder: Secondary | ICD-10-CM | POA: Diagnosis not present

## 2020-09-23 DIAGNOSIS — I130 Hypertensive heart and chronic kidney disease with heart failure and stage 1 through stage 4 chronic kidney disease, or unspecified chronic kidney disease: Secondary | ICD-10-CM | POA: Diagnosis not present

## 2020-10-03 DIAGNOSIS — M069 Rheumatoid arthritis, unspecified: Secondary | ICD-10-CM | POA: Diagnosis not present

## 2020-10-05 ENCOUNTER — Telehealth (HOSPITAL_COMMUNITY): Payer: Self-pay | Admitting: *Deleted

## 2020-10-05 NOTE — Addendum Note (Signed)
Addended by: Emmaline Life on: 10/05/2020 03:54 PM   Modules accepted: Orders

## 2020-10-05 NOTE — Telephone Encounter (Signed)
Patient given detailed instructions per Myocardial Perfusion Study Information Sheet for the test on 10/12/20 at 1030. Patient notified to arrive 15 minutes early and that it is imperative to arrive on time for appointment to keep from having the test rescheduled.  If you need to cancel or reschedule your appointment, please call the office within 24 hours of your appointment. . Patient verbalized understanding.Shelby Walker, [place] Patient does not use mychart.

## 2020-10-05 NOTE — Addendum Note (Signed)
Addended by: Thayer Headings on: 10/05/2020 06:37 PM   Modules accepted: Orders

## 2020-10-06 ENCOUNTER — Other Ambulatory Visit: Payer: Self-pay | Admitting: Podiatry

## 2020-10-06 ENCOUNTER — Ambulatory Visit (INDEPENDENT_AMBULATORY_CARE_PROVIDER_SITE_OTHER): Payer: Medicare Other | Admitting: Podiatry

## 2020-10-06 ENCOUNTER — Ambulatory Visit (HOSPITAL_COMMUNITY)
Admission: RE | Admit: 2020-10-06 | Discharge: 2020-10-06 | Disposition: A | Discharge status: Home / Self Care | Payer: Medicare Other | Source: Ambulatory Visit | Attending: Cardiology | Admitting: Cardiology

## 2020-10-06 ENCOUNTER — Ambulatory Visit (INDEPENDENT_AMBULATORY_CARE_PROVIDER_SITE_OTHER): Payer: Medicare Other

## 2020-10-06 ENCOUNTER — Ambulatory Visit (HOSPITAL_BASED_OUTPATIENT_CLINIC_OR_DEPARTMENT_OTHER)
Admission: RE | Admit: 2020-10-06 | Discharge: 2020-10-06 | Disposition: A | Discharge status: Home / Self Care | Payer: Medicare Other | Source: Ambulatory Visit | Attending: Cardiology | Admitting: Cardiology

## 2020-10-06 ENCOUNTER — Other Ambulatory Visit: Payer: Self-pay

## 2020-10-06 DIAGNOSIS — I739 Peripheral vascular disease, unspecified: Secondary | ICD-10-CM | POA: Insufficient documentation

## 2020-10-06 DIAGNOSIS — L03119 Cellulitis of unspecified part of limb: Secondary | ICD-10-CM | POA: Diagnosis not present

## 2020-10-06 DIAGNOSIS — I6523 Occlusion and stenosis of bilateral carotid arteries: Secondary | ICD-10-CM

## 2020-10-06 DIAGNOSIS — M79671 Pain in right foot: Secondary | ICD-10-CM

## 2020-10-06 DIAGNOSIS — L02619 Cutaneous abscess of unspecified foot: Secondary | ICD-10-CM | POA: Diagnosis not present

## 2020-10-06 DIAGNOSIS — T148XXA Other injury of unspecified body region, initial encounter: Secondary | ICD-10-CM | POA: Diagnosis not present

## 2020-10-06 NOTE — Progress Notes (Signed)
Shelby Walker

## 2020-10-07 ENCOUNTER — Telehealth: Payer: Self-pay | Admitting: *Deleted

## 2020-10-07 ENCOUNTER — Telehealth: Payer: Self-pay | Admitting: Cardiovascular Disease

## 2020-10-07 DIAGNOSIS — I739 Peripheral vascular disease, unspecified: Secondary | ICD-10-CM

## 2020-10-07 DIAGNOSIS — M79604 Pain in right leg: Secondary | ICD-10-CM

## 2020-10-07 LAB — CBC WITH DIFFERENTIAL/PLATELET
Absolute Monocytes: 559 cells/uL (ref 200–950)
Basophils Absolute: 69 cells/uL (ref 0–200)
Basophils Relative: 0.7 %
Eosinophils Absolute: 147 cells/uL (ref 15–500)
Eosinophils Relative: 1.5 %
HCT: 31.3 % — ABNORMAL LOW (ref 35.0–45.0)
Hemoglobin: 10 g/dL — ABNORMAL LOW (ref 11.7–15.5)
Lymphs Abs: 1323 cells/uL (ref 850–3900)
MCH: 29 pg (ref 27.0–33.0)
MCHC: 31.9 g/dL — ABNORMAL LOW (ref 32.0–36.0)
MCV: 90.7 fL (ref 80.0–100.0)
MPV: 11.3 fL (ref 7.5–12.5)
Monocytes Relative: 5.7 %
Neutro Abs: 7703 cells/uL (ref 1500–7800)
Neutrophils Relative %: 78.6 %
Platelets: 233 10*3/uL (ref 140–400)
RBC: 3.45 10*6/uL — ABNORMAL LOW (ref 3.80–5.10)
RDW: 13.6 % (ref 11.0–15.0)
Total Lymphocyte: 13.5 %
WBC: 9.8 10*3/uL (ref 3.8–10.8)

## 2020-10-07 LAB — SEDIMENTATION RATE: Sed Rate: 22 mm/h (ref 0–30)

## 2020-10-07 LAB — BASIC METABOLIC PANEL WITH GFR
BUN/Creatinine Ratio: 35 (calc) — ABNORMAL HIGH (ref 6–22)
BUN: 44 mg/dL — ABNORMAL HIGH (ref 7–25)
CO2: 25 mmol/L (ref 20–32)
Calcium: 8.3 mg/dL — ABNORMAL LOW (ref 8.6–10.4)
Chloride: 106 mmol/L (ref 98–110)
Creat: 1.26 mg/dL — ABNORMAL HIGH (ref 0.60–0.95)
Glucose, Bld: 149 mg/dL — ABNORMAL HIGH (ref 65–139)
Potassium: 4.7 mmol/L (ref 3.5–5.3)
Sodium: 141 mmol/L (ref 135–146)
eGFR: 42 mL/min/{1.73_m2} — ABNORMAL LOW (ref >=60)

## 2020-10-07 LAB — C-REACTIVE PROTEIN: CRP: 21.7 mg/L — ABNORMAL HIGH (ref <8.0)

## 2020-10-07 NOTE — Telephone Encounter (Signed)
Pt is scheduled to see Dr. Fletcher Anon in [facility] 10/11/20.

## 2020-10-07 NOTE — Telephone Encounter (Signed)
-----   Message from Trula Slade, DPM sent at 10/07/2020  9:03 AM EDT ----- Anola Gurney message.   -- The labs are stable for the most part. The white blood cell count that measures infection was normal, but does not exclude infection. The kidney function is somewhat decreased but appears stable compared to other labs from the past year. I did two other tests that measures generalized inflammation that is non-specific. One is normal but the other is elevated.   The ultrasound to check the veins was normal.   The arterial study did show decreased blood flow. We will see what Dr. Saunders Revel says about those.   I would continue with the doxycycline. If there is any worsening before the follow-up please let me know, or go to the ER.

## 2020-10-07 NOTE — Telephone Encounter (Signed)
-----   Message from Nelva Bush, MD sent at 10/07/2020 12:37 PM EDT ----- Please let Ms. Springsteen know that the measurements of blood flow in her legs are normal, though I suspect there could be some narrowing on the right that is not adequately assessed on this study.  If she is having any leg pain or wounds, please have her follow-up with me or one of the other vascular providers in [place] (if she prefers) to reassess her symptoms and determine if additional testing is necessary.

## 2020-10-07 NOTE — Telephone Encounter (Signed)
Pt's daughter is calling to confirm her mother still needs to come to the stress test

## 2020-10-07 NOTE — Telephone Encounter (Signed)
Called and relayed the message per Dr Jacqualyn Posey. Shelby Walker

## 2020-10-07 NOTE — Telephone Encounter (Signed)
Called and left a message for the daughter [name]) and relayed the message per Dr Jacqualyn Posey and stated that all of the information that I left on the voice mail is also in my chart as well. [name]

## 2020-10-07 NOTE — Telephone Encounter (Signed)
Spoke with the patient's daughter per DPR. She was wondering if the patient could delay having the stress test due to increased swelling in her legs. She has recently been referred to Vascular cardiology for follow up   Message from Nelva Bush, MD sent at 10/07/2020 12:37 PM EDT ----- Please let Ms. Hellmann know that the measurements of blood flow in her legs are normal, though I suspect there could be some narrowing on the right that is not adequately assessed on this study.  If she is having any leg pain or wounds, please have her follow-up with me or one of the other vascular providers in [place] (if she prefers) to reassess her symptoms and determine if additional testing is necessary

## 2020-10-07 NOTE — Telephone Encounter (Signed)
-----   Message from Trula Slade, DPM sent at 10/06/2020  6:03 PM EDT ----- [name]- please let her know that the ultrasound was negative for blood clot. I would start the doxycyline 100mg  twice a day. Her daughter states she had this at home already. Keep the leg elevated due to the swelling. If there is any worsening to let us know.

## 2020-10-07 NOTE — Telephone Encounter (Signed)
Spoke to pt's daughter, Shelby Walker (DPR approved). Notified of results and Dr. Darnelle Bos recc.  Shelby Walker confirmed that pt has had worsening right leg pain. States PCP has also r/o infection and gout with her worsening pain. So she does want to proceed with seeing a vascular provider in [place].  Referral placed and notified Shelby Walker our office will call her back today to schedule this appt for first available.  Shelby Walker very appreciative and has no further questions at this time.

## 2020-10-07 NOTE — Telephone Encounter (Signed)
The daughter has been made aware that we can postpone the Stress test for now.

## 2020-10-10 ENCOUNTER — Ambulatory Visit (INDEPENDENT_AMBULATORY_CARE_PROVIDER_SITE_OTHER): Payer: Medicare Other | Admitting: Podiatry

## 2020-10-10 ENCOUNTER — Other Ambulatory Visit: Payer: Self-pay

## 2020-10-10 ENCOUNTER — Other Ambulatory Visit: Payer: Self-pay | Admitting: Podiatry

## 2020-10-10 ENCOUNTER — Telehealth: Payer: Self-pay | Admitting: *Deleted

## 2020-10-10 DIAGNOSIS — E08621 Diabetes mellitus due to underlying condition with foot ulcer: Secondary | ICD-10-CM

## 2020-10-10 DIAGNOSIS — L02619 Cutaneous abscess of unspecified foot: Secondary | ICD-10-CM | POA: Diagnosis not present

## 2020-10-10 DIAGNOSIS — L03119 Cellulitis of unspecified part of limb: Secondary | ICD-10-CM | POA: Diagnosis not present

## 2020-10-10 DIAGNOSIS — L97512 Non-pressure chronic ulcer of other part of right foot with fat layer exposed: Secondary | ICD-10-CM

## 2020-10-10 DIAGNOSIS — L02611 Cutaneous abscess of right foot: Secondary | ICD-10-CM | POA: Diagnosis not present

## 2020-10-10 DIAGNOSIS — I6523 Occlusion and stenosis of bilateral carotid arteries: Secondary | ICD-10-CM

## 2020-10-10 MED ORDER — GENTAMICIN SULFATE 0.1 % EX CREA
1.0000 | TOPICAL_CREAM | Freq: Two times a day (BID) | CUTANEOUS | 1 refills | Status: DC
Start: 2020-10-10 — End: 2020-10-19

## 2020-10-10 MED ORDER — DOXYCYCLINE HYCLATE 100 MG PO TABS
100.0000 mg | ORAL_TABLET | Freq: Two times a day (BID) | ORAL | 0 refills | Status: DC
Start: 2020-10-10 — End: 2020-11-04

## 2020-10-10 NOTE — Telephone Encounter (Signed)
Patient's daughter is calling because her mother had developed a blister that burst and bleed over the weekend,is diabetic and wanted to make sure everything is ok. Please advise.

## 2020-10-10 NOTE — Progress Notes (Signed)
Subjective:  87 y.o. female with PMHx of diabetes mellitus presenting today for follow-up evaluation of an ulcer to the plantar aspect of the right foot that has been present for several weeks.  Originally the wound was a simple callus that developed into an ulcer.  She was last seen here in the office on Thursday, 10/06/2020, by Dr. Jacqualyn Posey.  Since then they have noticed an increase amount of draining and increased pain.  They present for further treatment and evaluation   Past Medical History:  Diagnosis Date   Bruit    Carotid Doppler showed no significant abnormality     9per patient)   C. difficile colitis 07/2018   with severe sepsis   Chest pain, unspecified    Nuclear, May, 2008, no scar or ischemia   Diabetes mellitus    Diverticulosis    Dyslipidemia    Ejection fraction    EF 55-60%, echo, February, 2011 // Echocardiogram 8/21: EF 60-65, no RWMA, Gr 1 DD, GLS -14%, normal RVSF, mild LAE, trivial MR, mild MS (mean 4 mmHg), RVSP 23.4    GERD (gastroesophageal reflux disease)    Mitral regurgitation April 21, 2009   mild,  echo, February, 2011   Osteoporosis    Palpitations    possible very brief atrial fibrillation on monitor and possible reentrant tachycardia   Psoriasis    Rheumatoid arthritis ([place])        Objective/Physical Exam General: The patient is alert and oriented x3 in no acute distress.  Dermatology:  Wound #1 noted to the plantar aspect of the right first MTP joint which only measures about 0.3 x 0.3 x 0.5 cm (LxWxD).   To the noted ulceration(s), there is no eschar. There is a moderate amount of slough, fibrin, and necrotic tissue noted within the wound base.  There is a minimal amount of serosanguineous drainage noted. There is no exposed bone muscle-tendon ligament or joint at the moment. There is no malodor. Periwound integrity is intact. Skin is warm, dry and supple bilateral lower extremities.  Vascular: Diminished pedal pulses bilaterally. No  edema or erythema noted.   Neurological: Epicritic and protective threshold intact bilaterally.   Musculoskeletal Exam: Range of motion within normal limits to all pedal and ankle joints bilateral. Muscle strength 5/5 in all groups bilateral.   Assessment: 1.  Ulcers of the first MTP joint RT foot secondary to diabetes mellitus 2. diabetes mellitus w/ peripheral neuropathy   Plan of Care:  1. Patient was evaluated. 2. medically necessary excisional debridement including subcutaneous tissue was performed using a tissue nipper and a chisel blade. Excisional debridement of all the necrotic nonviable tissue down to healthy bleeding viable tissue was performed with post-debridement measurements same as pre-. 3. the wound was cleansed and dry sterile dressing applied. 4.  Cultures taken and sent to pathology for culture and sensitivity  5.  Refill prescription for doxycycline 100 mg 2 times daily for an additional 7 days.   6.  Prescription for gentamicin cream applied daily  7.  Postsurgical shoe dispensed.  Weightbearing as tolerated  8.  Patient has an appointment with vascular, Dr. Fletcher Anon, this week 9.  Patient is to return to clinic in 2 weeks.  *Daughter's name is Toniann Ket, DPM [facility]  Dr. Edrick Kins, DPM    [address]  [place], [place] [address]                Office [phone]  Fax [phone]

## 2020-10-11 ENCOUNTER — Encounter: Payer: Self-pay | Admitting: Podiatry

## 2020-10-11 ENCOUNTER — Ambulatory Visit (INDEPENDENT_AMBULATORY_CARE_PROVIDER_SITE_OTHER): Payer: Medicare Other | Admitting: Cardiovascular Disease

## 2020-10-11 ENCOUNTER — Other Ambulatory Visit: Payer: Self-pay | Admitting: Podiatry

## 2020-10-11 VITALS — BP 112/62 | HR 76 | Ht 61.5 in | Wt 124.6 lb

## 2020-10-11 DIAGNOSIS — I6523 Occlusion and stenosis of bilateral carotid arteries: Secondary | ICD-10-CM | POA: Diagnosis not present

## 2020-10-11 DIAGNOSIS — I739 Peripheral vascular disease, unspecified: Secondary | ICD-10-CM

## 2020-10-11 DIAGNOSIS — E785 Hyperlipidemia, unspecified: Secondary | ICD-10-CM

## 2020-10-11 DIAGNOSIS — T148XXA Other injury of unspecified body region, initial encounter: Secondary | ICD-10-CM

## 2020-10-11 DIAGNOSIS — I480 Paroxysmal atrial fibrillation: Secondary | ICD-10-CM | POA: Diagnosis not present

## 2020-10-11 NOTE — H&P (View-Only) (Signed)
Cardiology Office Note   Date:  10/12/2020   ID:  Shelby Walker, DOB 08-30-1933, MRN [id number]  PCP:  Reynold Bowen, MD  Cardiologist:  Dr. Acie Fredrickson  No chief complaint on file.     History of Present Illness: Shelby Walker is a 87 y.o. female who is referred for evaluation management of peripheral arterial disease.  She has known history of paroxysmal atrial fibrillation on anticoagulation with Eliquis, previous atypical chest pain with negative cardiac work-up, type 2 diabetes, hyperlipidemia, chronic kidney disease and rheumatoid arthritis.  The patient was seen in the past by Dr. Saunders Revel for peripheral arterial disease.  She was felt to have atypical claudication with moderate SFA disease.  Medical therapy was recommended.  She recently developed right foot swelling with a small ulceration on the bottom of the foot. She underwent recent noninvasive vascular studies which showed normal ABI bilaterally.  Previous duplex in 2021 showed moderate bilateral SFA disease.  Venous Doppler was negative for DVT.  She was placed on antibiotics by podiatry.  She is a lifelong non-smoker.  Past Medical History:  Diagnosis Date   Bruit    Carotid Doppler showed no significant abnormality     9per patient)   C. difficile colitis 07/2018   with severe sepsis   Chest pain, unspecified    Nuclear, May, 2008, no scar or ischemia   Diabetes mellitus    Diverticulosis    Dyslipidemia    Ejection fraction    EF 55-60%, echo, February, 2011 // Echocardiogram 8/21: EF 60-65, no RWMA, Gr 1 DD, GLS -14%, normal RVSF, mild LAE, trivial MR, mild MS (mean 4 mmHg), RVSP 23.4    GERD (gastroesophageal reflux disease)    Mitral regurgitation April 21, 2009   mild,  echo, February, 2011   Osteoporosis    Palpitations    possible very brief atrial fibrillation on monitor and possible reentrant tachycardia   Psoriasis    Rheumatoid arthritis ([place])     Past Surgical History:  Procedure Laterality Date    FRACTURE SURGERY       Current Outpatient Medications  Medication Sig Dispense Refill   acetaminophen (TYLENOL) 325 MG tablet Take 2 tablets (650 mg total) by mouth every 6 (six) hours as needed for mild pain (or Fever >/= 101).     apixaban (ELIQUIS) 2.5 MG TABS tablet Take 1 tablet by mouth 2 times daily 60 tablet 1   atorvastatin (LIPITOR) 20 MG tablet Take 1 tablet by mouth once daily 90 tablet 1   Calcium Carbonate-Vitamin D 600-400 MG-UNIT tablet Take 1 tablet by mouth daily at 12 noon.      carvedilol (COREG) 3.125 MG tablet TAKE 1 TABLET BY MOUTH TWICE DAILY WITH MEALS 180 tablet 3   Cholecalciferol 25 MCG (1000 UT) capsule Take 1 capsule (1,000 Units total) by mouth daily. 30 capsule 0   doxycycline (VIBRA-TABS) 100 MG tablet Take 1 tablet (100 mg total) by mouth 2 (two) times daily. 14 tablet 0   esomeprazole (NEXIUM) 40 MG capsule Take 1 capsule (40 mg total) by mouth daily at 12 noon. 30 capsule 0   gentamicin cream (GARAMYCIN) 0.1 % Apply 1 application topically 2 (two) times daily. 30 g 1   insulin aspart (NOVOLOG) 100 UNIT/ML FlexPen Inject 6 Units into the skin 3 (three) times daily with meals.     Insulin Degludec (TRESIBA [place]) Inject 8 Units into the skin at bedtime as needed.     Multiple Vitamins-Minerals (  CENTRUM SILVER 50+WOMEN PO) Take 1 tablet by mouth daily. 20 mg tablet     nitroGLYCERIN (NITROSTAT) 0.4 MG SL tablet Place 1 tablet (0.4 mg total) under the tongue every 5 (five) minutes as needed for chest pain. 25 tablet 6   Nutritional Supplements (ENSURE ACTIVE PO) Patient only drink half of 237MLS  occasionally daily     RESTASIS 0.05 % ophthalmic emulsion Place 1 drop into both eyes 2 (two) times daily.     traMADol (ULTRAM) 50 MG tablet Take 50 mg by mouth daily as needed.     predniSONE (DELTASONE) 5 MG tablet Take 4 mg by mouth daily. 4mg  in the morning and 2.5 in the evening     No current facility-administered medications for this visit.    Allergies:    Cholestyramine, Compazine [prochlorperazine edisylate], and Sulfonamide derivatives    Social History:  The patient  reports that she has never smoked. She has never used smokeless tobacco. She reports that she does not drink alcohol and does not use drugs.   Family History:  The patient's family history includes Diabetes in her father and mother; Heart attack in her father and mother; Heart failure in her father and mother; Stroke in her sister.    ROS:  Please see the history of present illness.   Otherwise, review of systems are positive for none.   All other systems are reviewed and negative.    PHYSICAL EXAM: VS:  BP 112/62   Pulse 76   Ht 5' 1.5" (1.562 m)   Wt 124 lb 9.6 oz (56.5 kg)   SpO2 98%   BMI 23.16 kg/m  , BMI Body mass index is 23.16 kg/m. GEN: Well nourished, well developed, in no acute distress  HEENT: normal  Neck: no JVD, carotid bruits, or masses Cardiac: RRR; no murmurs, rubs, or gallops,no edema  Respiratory:  clear to auscultation bilaterally, normal work of breathing GI: soft, nontender, nondistended, + BS MS: no deformity or atrophy  Skin: warm and dry, no rash Neuro:  Strength and sensation are intact Psych: euthymic mood, full affect Vascular: Femoral pulses normal bilaterally.  Dorsalis pedis is palpable on the right side but the posterior tibial is not palpable.  There is small ulceration on the bottom of the right foot   EKG:  EKG is not ordered today.    Recent Labs: 10/06/2020: BUN 44; Creat 1.26; Hemoglobin 10.0; Platelets 233; Potassium 4.7; Sodium 141    Lipid Panel    Component Value Date/Time   CHOL 129 12/24/2016 1131   TRIG 151 (H) 12/24/2016 1131   HDL 52 12/24/2016 1131   CHOLHDL 2.5 12/24/2016 1131   LDLCALC 47 12/24/2016 1131      Wt Readings from Last 3 Encounters:  10/11/20 124 lb 9.6 oz (56.5 kg)  09/21/20 125 lb 9.6 oz (57 kg)  05/07/20 128 lb (58.1 kg)       PAD Screen 09/28/2016  Pain with walking? Yes   Subsides with rest? Yes  Feet/toe relief with dangling? Yes  Painful, non-healing ulcers? No  Extremities discolored? Yes      ASSESSMENT AND PLAN:  1.  Peripheral arterial disease: The patient has small ulceration on the bottom of the right foot in the distribution of the posterior tibial artery.  Although her ABI is normal, she is known to have at least moderate SFA disease.  Also by physical exam her posterior tibial pulses is not palpable and she might have significant below the knee  disease.  Due to this, I recommend proceeding with abdominal aortogram with right lower extremity angiography and possible endovascular intervention.  I discussed the procedure in details as well as risk and benefits.  Her GFR is 42 and thus we will hydrate for 4 hours before the procedure.  2.  Paroxysmal atrial fibrillation: She seems to be in sinus rhythm.  She was on anticoagulation with Eliquis.  Hold Eliquis 2 days before angiography.  3.  Hyperlipidemia: Continue treatment with atorvastatin with a target LDL of less than 70.    Disposition:   Proceed with an angiogram in 1 week and follow-up with me in 1 month.  Signed,  Kathlyn Sacramento, MD  10/12/2020 3:39 PM    [place]

## 2020-10-11 NOTE — Progress Notes (Signed)
Cardiology Office Note   Date:  10/12/2020   ID:  Shelby Walker, DOB 1933/03/27, MRN [id number]  PCP:  Reynold Bowen, MD  Cardiologist:  Dr. Acie Fredrickson  No chief complaint on file.     History of Present Illness: Shelby Walker is a 87 y.o. female who is referred for evaluation management of peripheral arterial disease.  She has known history of paroxysmal atrial fibrillation on anticoagulation with Eliquis, previous atypical chest pain with negative cardiac work-up, type 2 diabetes, hyperlipidemia, chronic kidney disease and rheumatoid arthritis.  The patient was seen in the past by Dr. Saunders Revel for peripheral arterial disease.  She was felt to have atypical claudication with moderate SFA disease.  Medical therapy was recommended.  She recently developed right foot swelling with a small ulceration on the bottom of the foot. She underwent recent noninvasive vascular studies which showed normal ABI bilaterally.  Previous duplex in 2021 showed moderate bilateral SFA disease.  Venous Doppler was negative for DVT.  She was placed on antibiotics by podiatry.  She is a lifelong non-smoker.  Past Medical History:  Diagnosis Date   Bruit    Carotid Doppler showed no significant abnormality     9per patient)   C. difficile colitis 07/2018   with severe sepsis   Chest pain, unspecified    Nuclear, May, 2008, no scar or ischemia   Diabetes mellitus    Diverticulosis    Dyslipidemia    Ejection fraction    EF 55-60%, echo, February, 2011 // Echocardiogram 8/21: EF 60-65, no RWMA, Gr 1 DD, GLS -14%, normal RVSF, mild LAE, trivial MR, mild MS (mean 4 mmHg), RVSP 23.4    GERD (gastroesophageal reflux disease)    Mitral regurgitation April 21, 2009   mild,  echo, February, 2011   Osteoporosis    Palpitations    possible very brief atrial fibrillation on monitor and possible reentrant tachycardia   Psoriasis    Rheumatoid arthritis ([place])     Past Surgical History:  Procedure Laterality Date    FRACTURE SURGERY       Current Outpatient Medications  Medication Sig Dispense Refill   acetaminophen (TYLENOL) 325 MG tablet Take 2 tablets (650 mg total) by mouth every 6 (six) hours as needed for mild pain (or Fever >/= 101).     apixaban (ELIQUIS) 2.5 MG TABS tablet Take 1 tablet by mouth 2 times daily 60 tablet 1   atorvastatin (LIPITOR) 20 MG tablet Take 1 tablet by mouth once daily 90 tablet 1   Calcium Carbonate-Vitamin D 600-400 MG-UNIT tablet Take 1 tablet by mouth daily at 12 noon.      carvedilol (COREG) 3.125 MG tablet TAKE 1 TABLET BY MOUTH TWICE DAILY WITH MEALS 180 tablet 3   Cholecalciferol 25 MCG (1000 UT) capsule Take 1 capsule (1,000 Units total) by mouth daily. 30 capsule 0   doxycycline (VIBRA-TABS) 100 MG tablet Take 1 tablet (100 mg total) by mouth 2 (two) times daily. 14 tablet 0   esomeprazole (NEXIUM) 40 MG capsule Take 1 capsule (40 mg total) by mouth daily at 12 noon. 30 capsule 0   gentamicin cream (GARAMYCIN) 0.1 % Apply 1 application topically 2 (two) times daily. 30 g 1   insulin aspart (NOVOLOG) 100 UNIT/ML FlexPen Inject 6 Units into the skin 3 (three) times daily with meals.     Insulin Degludec (TRESIBA [place]) Inject 8 Units into the skin at bedtime as needed.     Multiple Vitamins-Minerals (  CENTRUM SILVER 50+WOMEN PO) Take 1 tablet by mouth daily. 20 mg tablet     nitroGLYCERIN (NITROSTAT) 0.4 MG SL tablet Place 1 tablet (0.4 mg total) under the tongue every 5 (five) minutes as needed for chest pain. 25 tablet 6   Nutritional Supplements (ENSURE ACTIVE PO) Patient only drink half of 237MLS  occasionally daily     RESTASIS 0.05 % ophthalmic emulsion Place 1 drop into both eyes 2 (two) times daily.     traMADol (ULTRAM) 50 MG tablet Take 50 mg by mouth daily as needed.     predniSONE (DELTASONE) 5 MG tablet Take 4 mg by mouth daily. 4mg  in the morning and 2.5 in the evening     No current facility-administered medications for this visit.    Allergies:    Cholestyramine, Compazine [prochlorperazine edisylate], and Sulfonamide derivatives    Social History:  The patient  reports that she has never smoked. She has never used smokeless tobacco. She reports that she does not drink alcohol and does not use drugs.   Family History:  The patient's family history includes Diabetes in her father and mother; Heart attack in her father and mother; Heart failure in her father and mother; Stroke in her sister.    ROS:  Please see the history of present illness.   Otherwise, review of systems are positive for none.   All other systems are reviewed and negative.    PHYSICAL EXAM: VS:  BP 112/62   Pulse 76   Ht 5' 1.5" (1.562 m)   Wt 124 lb 9.6 oz (56.5 kg)   SpO2 98%   BMI 23.16 kg/m  , BMI Body mass index is 23.16 kg/m. GEN: Well nourished, well developed, in no acute distress  HEENT: normal  Neck: no JVD, carotid bruits, or masses Cardiac: RRR; no murmurs, rubs, or gallops,no edema  Respiratory:  clear to auscultation bilaterally, normal work of breathing GI: soft, nontender, nondistended, + BS MS: no deformity or atrophy  Skin: warm and dry, no rash Neuro:  Strength and sensation are intact Psych: euthymic mood, full affect Vascular: Femoral pulses normal bilaterally.  Dorsalis pedis is palpable on the right side but the posterior tibial is not palpable.  There is small ulceration on the bottom of the right foot   EKG:  EKG is not ordered today.    Recent Labs: 10/06/2020: BUN 44; Creat 1.26; Hemoglobin 10.0; Platelets 233; Potassium 4.7; Sodium 141    Lipid Panel    Component Value Date/Time   CHOL 129 12/24/2016 1131   TRIG 151 (H) 12/24/2016 1131   HDL 52 12/24/2016 1131   CHOLHDL 2.5 12/24/2016 1131   LDLCALC 47 12/24/2016 1131      Wt Readings from Last 3 Encounters:  10/11/20 124 lb 9.6 oz (56.5 kg)  09/21/20 125 lb 9.6 oz (57 kg)  05/07/20 128 lb (58.1 kg)       PAD Screen 09/28/2016  Pain with walking? Yes   Subsides with rest? Yes  Feet/toe relief with dangling? Yes  Painful, non-healing ulcers? No  Extremities discolored? Yes      ASSESSMENT AND PLAN:  1.  Peripheral arterial disease: The patient has small ulceration on the bottom of the right foot in the distribution of the posterior tibial artery.  Although her ABI is normal, she is known to have at least moderate SFA disease.  Also by physical exam her posterior tibial pulses is not palpable and she might have significant below the knee  disease.  Due to this, I recommend proceeding with abdominal aortogram with right lower extremity angiography and possible endovascular intervention.  I discussed the procedure in details as well as risk and benefits.  Her GFR is 42 and thus we will hydrate for 4 hours before the procedure.  2.  Paroxysmal atrial fibrillation: She seems to be in sinus rhythm.  She was on anticoagulation with Eliquis.  Hold Eliquis 2 days before angiography.  3.  Hyperlipidemia: Continue treatment with atorvastatin with a target LDL of less than 70.    Disposition:   Proceed with an angiogram in 1 week and follow-up with me in 1 month.  Signed,  Kathlyn Sacramento, MD  10/12/2020 3:39 PM    [place]

## 2020-10-11 NOTE — Patient Instructions (Signed)
Medication Instructions:  No changes *If you need a refill on your cardiac medications before your next appointment, please call your pharmacy*   Lab Work: None ordered If you have labs (blood work) drawn today and your tests are completely normal, you will receive your results only by: [place] (if you have MyChart) OR A paper copy in the mail If you have any lab test that is abnormal or we need to change your treatment, we will call you to review the results.   Testing/Procedures: Your physician has requested that you have a peripheral vascular angiogram. This exam is performed at the hospital. During this exam IV contrast is used to look at arterial blood flow. Please review the information sheet given for details.  Follow-Up: At [facility], you and your health needs are our priority.  As part of our continuing mission to provide you with exceptional heart care, we have created designated Provider Care Teams.  These Care Teams include your primary Cardiologist (physician) and Advanced Practice Providers (APPs -  Physician Assistants and Nurse Practitioners) who all work together to provide you with the care you need, when you need it.  We recommend signing up for the patient portal called "MyChart".  Sign up information is provided on this After Visit Summary.  MyChart is used to connect with patients for Virtual Visits (Telemedicine).  Patients are able to view lab/test results, encounter notes, upcoming appointments, etc.  Non-urgent messages can be sent to your provider as well.   To learn more about what you can do with MyChart, go to [email or web address].    Your next appointment:   3 week(s)  The format for your next appointment:   In Person  Provider:   Kathlyn Sacramento, MD   Other Instructions  [address] [place] [address] Dept: [phone] Loc:  [phone]  Shelby Walker  10/11/2020  You are scheduled for a Peripheral Angiogram on Wednesday, August 17 with Dr. Kathlyn Sacramento.  1. Please arrive at the [facility] (Main Entrance A) at [facility]: [address] [place], [place] [address] at 6:00 AM (This time is four hours before your procedure to ensure your preparation). Free valet parking service is available.   Special note: Every effort is made to have your procedure done on time. Please understand that emergencies sometimes delay scheduled procedures.  2. Diet: Do not eat solid foods after midnight.  The patient may have clear liquids until 5am upon the day of the procedure.  3. Labs: You will need to have blood drawn on 10/06/20  4. Medication instructions in preparation for your procedure: Hold the Eliquis for two days prior to the procedure. Hold on Monday the 15th and Tuesday the 16th (plus the morning of) Hold all diabetic medication the morning of the procedure  On the morning of your procedure, take your Aspirin and any morning medicines NOT listed above.  You may use sips of water.  5. Plan for one night stay--bring personal belongings. 6. Bring a current list of your medications and current insurance cards. 7. You MUST have a responsible person to drive you home. 8. Someone MUST be with you the first 24 hours after you arrive home or your discharge will be delayed. 9. Please wear clothes that are easy to get on and off and wear slip-on shoes.  Thank you for allowing [place] to care for you!   -- [facility]

## 2020-10-11 NOTE — Progress Notes (Signed)
Subjective:   Patient ID: Shelby Walker, female   DOB: 87 y.o.   MRN: [id number]   HPI 87 year old female presents the office today with her daughter for concerns of right foot pain, swelling.  She is noted some redness of the foot but this is also started going to the leg.  This been getting worse over the last 3 weeks.  She states that she has steroids for possible gout which did not help.  She did have an increase in uric acid on blood test however.  Was told she did not have any infection.  She denies any recent injury.  She is scheduled for arterial studies later today.   Review of Systems  All other systems reviewed and are negative.  Past Medical History:  Diagnosis Date   Bruit    Carotid Doppler showed no significant abnormality     9per patient)   C. difficile colitis 07/2018   with severe sepsis   Chest pain, unspecified    Nuclear, May, 2008, no scar or ischemia   Diabetes mellitus    Diverticulosis    Dyslipidemia    Ejection fraction    EF 55-60%, echo, February, 2011 // Echocardiogram 8/21: EF 60-65, no RWMA, Gr 1 DD, GLS -14%, normal RVSF, mild LAE, trivial MR, mild MS (mean 4 mmHg), RVSP 23.4    GERD (gastroesophageal reflux disease)    Mitral regurgitation April 21, 2009   mild,  echo, February, 2011   Osteoporosis    Palpitations    possible very brief atrial fibrillation on monitor and possible reentrant tachycardia   Psoriasis    Rheumatoid arthritis ([place])     Past Surgical History:  Procedure Laterality Date   FRACTURE SURGERY       Current Outpatient Medications:    acetaminophen (TYLENOL) 325 MG tablet, Take 2 tablets (650 mg total) by mouth every 6 (six) hours as needed for mild pain (or Fever >/= 101)., Disp:  , Rfl:    apixaban (ELIQUIS) 2.5 MG TABS tablet, Take 1 tablet by mouth 2 times daily, Disp: 60 tablet, Rfl: 1   atorvastatin (LIPITOR) 20 MG tablet, Take 1 tablet by mouth once daily, Disp: 90 tablet, Rfl: 1   Calcium Carbonate-Vitamin D  600-400 MG-UNIT tablet, Take 1 tablet by mouth daily at 12 noon. , Disp: , Rfl:    carvedilol (COREG) 3.125 MG tablet, TAKE 1 TABLET BY MOUTH TWICE DAILY WITH MEALS, Disp: 180 tablet, Rfl: 3   Cholecalciferol 25 MCG (1000 UT) capsule, Take 1 capsule (1,000 Units total) by mouth daily., Disp: 30 capsule, Rfl: 0   doxycycline (VIBRA-TABS) 100 MG tablet, Take 1 tablet (100 mg total) by mouth 2 (two) times daily., Disp: 14 tablet, Rfl: 0   esomeprazole (NEXIUM) 40 MG capsule, Take 1 capsule (40 mg total) by mouth daily at 12 noon., Disp: 30 capsule, Rfl: 0   gentamicin cream (GARAMYCIN) 0.1 %, Apply 1 application topically 2 (two) times daily., Disp: 30 g, Rfl: 1   insulin aspart (NOVOLOG) 100 UNIT/ML FlexPen, Inject 6 Units into the skin 3 (three) times daily with meals., Disp: , Rfl:    Insulin Degludec (TRESIBA [place]), Inject 8 Units into the skin at bedtime as needed., Disp: , Rfl:    Multiple Vitamins-Minerals (CENTRUM SILVER 50+WOMEN PO), Take 1 tablet by mouth daily. 20 mg tablet, Disp: , Rfl:    nitroGLYCERIN (NITROSTAT) 0.4 MG SL tablet, Place 1 tablet (0.4 mg total) under the tongue every 5 (five) minutes  as needed for chest pain., Disp: 25 tablet, Rfl: 6   Nutritional Supplements (ENSURE ACTIVE PO), Patient only drink half of 237MLS  occasionally daily, Disp: , Rfl:    predniSONE (DELTASONE) 5 MG tablet, Take 4 mg by mouth daily. 4mg  in the morning and 2.5 in the evening, Disp: , Rfl:    RESTASIS 0.05 % ophthalmic emulsion, Place 1 drop into both eyes 2 (two) times daily., Disp: , Rfl:    traMADol (ULTRAM) 50 MG tablet, Take 50 mg by mouth daily as needed., Disp: , Rfl:   Allergies  Allergen Reactions   Cholestyramine     Possible- Makes throat burn. Patient said she can't take it    Compazine [Prochlorperazine Edisylate] Other (See Comments)    REACTION: " tongue swell and unable to swallow"   Sulfonamide Derivatives Other (See Comments)    REACTION: " broke out with fine itching bumps"           Objective:  Physical Exam  General: AAO x3, NAD  Dermatological: There is edema and erythema noted to the foot as well as the leg there is no open sores or any blisters present.  There is no drainage.  No significant warmth associated with the erythema.  Erythema is blanchable.  Vascular: Dorsalis Pedis artery and Posterior Tibial artery pedal pulses are 1/4 bilateral with immedate capillary fill time.There is no pain with calf compression, swelling, warmth, erythema.   Neruologic: Grossly intact via light touch bilateral.   Musculoskeletal: Diffuse tenderness mildly to the foot as well as to the leg associate with edema, erythema.  No specific area of pinpoint tenderness.  Digital deformities are noted.  Hallux malleus noted at the level of the IPJ.  Muscular strength 5/5 in all groups tested bilateral.  Gait: Unassisted, Nonantalgic.       Assessment:   Right foot, leg swelling, erythema     Plan:  -Treatment options discussed including all alternatives, risks, and complications -Etiology of symptoms were discussed.  Could represent gout but also given the swelling in her leg, neuro recommended venous duplex. -X-rays were obtained and reviewed with the patient.  Evidence of acute fracture identified.  Status post pan metatarsal head resection with fusion of the first MPJ with digital contractures present. -I will order venous duplex given the edema and erythema.  She is scheduled for arterial studies later today we will see if we can get this done at the same time. -Recheck blood work including CBC, CRP, sed rate, BMP. -Encouraged elevation.  Monitor for skin breakdown.    Trula Slade DPM

## 2020-10-12 ENCOUNTER — Encounter (HOSPITAL_COMMUNITY): Payer: Medicare Other

## 2020-10-12 ENCOUNTER — Telehealth: Payer: Self-pay | Admitting: Cardiovascular Disease

## 2020-10-12 NOTE — Telephone Encounter (Signed)
Spoke with pt daughter [name] ([place] per [facility]).  She reports that pt had an OV with Dr. Fletcher Anon on Monday.  He ordered pt to have a peripheral angiogram.  Daughter wants Dr. Elmarie Shiley opinion on if this procedure is reasonable for pt.  She expresses that Dr. Fletcher Anon explained procedure to her.  I informed her that Dr. Fletcher Anon is also a Cardiologist.  Ultimately the decision is up to her and patient. However, she would like Dr. Elmarie Shiley input.  Will route to MD per request.

## 2020-10-12 NOTE — Telephone Encounter (Signed)
Patient's daughter mentioned that the patient has a diabetic ulcer on foot. Dr. Fletcher Anon would like to do a precedure and  wahts to Dr. Acie Fredrickson is about.

## 2020-10-13 NOTE — Telephone Encounter (Signed)
Spoke with the patient's daughter and advised her that Dr. Acie Fredrickson agreed with Dr. Fletcher Anon for the patient to have an angiogram. Patient's daughter verbalized understanding.

## 2020-10-14 LAB — WOUND CULTURE
MICRO NUMBER:: 12216975
RESULT:: NO GROWTH
SPECIMEN QUALITY:: ADEQUATE

## 2020-10-14 LAB — HOUSE ACCOUNT TRACKING

## 2020-10-17 ENCOUNTER — Telehealth: Payer: Self-pay | Admitting: *Deleted

## 2020-10-17 NOTE — Telephone Encounter (Addendum)
Abdominal aortogram scheduled at [facility] for: Wednesday October 19, 2020 10:30 AM [facility] Main Entrance [facility]) at: 6:30 AM-pre-procedure hydration   No solid food after midnight prior to cath, clear liquids until 5 AM day of procedure.  Medication instructions: Hold: -Eliquis-none 10/17/20 until post procedure -Insulin-AM of procedure/1/2 usual Insulin HS prior to procedure  Except hold medications morning medications can be taken pre-cath with sips of water including aspirin 81 mg.    Confirmed patient has responsible adult to drive home post procedure and be with patient first 24 hours after arriving home.  Patients are allowed one visitor in the waiting room during the time they are at the hospital for their procedure. Both patient and visitor must wear a mask once they enter the hospital.   Patient reports does not currently have any symptoms concerning for COVID-19 and no household members with COVID-19 like illness.            Reviewed procedure/mask/visitor instructions with patient's daughter (DPR), [name].

## 2020-10-18 NOTE — Telephone Encounter (Signed)
Shelby Walker is calling requesting to speak with Webb Silversmith in regards to their previous conversation yesterday. Please advise.

## 2020-10-18 NOTE — Telephone Encounter (Signed)
Spoke with [name], questions answered.

## 2020-10-19 ENCOUNTER — Encounter (HOSPITAL_COMMUNITY): Payer: Self-pay | Admitting: Cardiovascular Disease

## 2020-10-19 ENCOUNTER — Telehealth: Payer: Self-pay | Admitting: Cardiovascular Disease

## 2020-10-19 ENCOUNTER — Other Ambulatory Visit: Payer: Self-pay

## 2020-10-19 ENCOUNTER — Ambulatory Visit (HOSPITAL_COMMUNITY)
Admission: RE | Admit: 2020-10-19 | Discharge: 2020-10-19 | Disposition: A | Discharge status: Home / Self Care | Payer: Medicare Other | Attending: Cardiovascular Disease | Admitting: Cardiovascular Disease

## 2020-10-19 ENCOUNTER — Encounter (HOSPITAL_COMMUNITY)
Admission: RE | Disposition: A | Discharge status: Home / Self Care | Payer: Self-pay | Source: Home / Self Care | Attending: Cardiovascular Disease

## 2020-10-19 DIAGNOSIS — E1151 Type 2 diabetes mellitus with diabetic peripheral angiopathy without gangrene: Secondary | ICD-10-CM | POA: Diagnosis not present

## 2020-10-19 DIAGNOSIS — I70235 Atherosclerosis of native arteries of right leg with ulceration of other part of foot: Secondary | ICD-10-CM | POA: Insufficient documentation

## 2020-10-19 DIAGNOSIS — L97519 Non-pressure chronic ulcer of other part of right foot with unspecified severity: Secondary | ICD-10-CM | POA: Insufficient documentation

## 2020-10-19 DIAGNOSIS — Z888 Allergy status to other drugs, medicaments and biological substances status: Secondary | ICD-10-CM | POA: Insufficient documentation

## 2020-10-19 DIAGNOSIS — I480 Paroxysmal atrial fibrillation: Secondary | ICD-10-CM | POA: Diagnosis not present

## 2020-10-19 DIAGNOSIS — Z79899 Other long term (current) drug therapy: Secondary | ICD-10-CM | POA: Insufficient documentation

## 2020-10-19 DIAGNOSIS — E785 Hyperlipidemia, unspecified: Secondary | ICD-10-CM | POA: Diagnosis not present

## 2020-10-19 DIAGNOSIS — I739 Peripheral vascular disease, unspecified: Secondary | ICD-10-CM

## 2020-10-19 DIAGNOSIS — Z794 Long term (current) use of insulin: Secondary | ICD-10-CM | POA: Insufficient documentation

## 2020-10-19 DIAGNOSIS — Z882 Allergy status to sulfonamides status: Secondary | ICD-10-CM | POA: Diagnosis not present

## 2020-10-19 DIAGNOSIS — E11621 Type 2 diabetes mellitus with foot ulcer: Secondary | ICD-10-CM | POA: Diagnosis not present

## 2020-10-19 DIAGNOSIS — Z7901 Long term (current) use of anticoagulants: Secondary | ICD-10-CM | POA: Insufficient documentation

## 2020-10-19 DIAGNOSIS — I70234 Atherosclerosis of native arteries of right leg with ulceration of heel and midfoot: Secondary | ICD-10-CM | POA: Diagnosis not present

## 2020-10-19 HISTORY — PX: ABDOMINAL AORTOGRAM W/LOWER EXTREMITY: CATH118223

## 2020-10-19 LAB — GLUCOSE, CAPILLARY: Glucose-Capillary: 132 mg/dL — ABNORMAL HIGH (ref 70–99)

## 2020-10-19 SURGERY — ABDOMINAL AORTOGRAM W/LOWER EXTREMITY
Anesthesia: LOCAL | Laterality: Right

## 2020-10-19 MED ORDER — HEPARIN (PORCINE) IN NACL 1000-0.9 UT/500ML-% IV SOLN
INTRAVENOUS | Status: AC
  Filled 2020-10-19: qty 500

## 2020-10-19 MED ORDER — FENTANYL CITRATE (PF) 100 MCG/2ML IJ SOLN
INTRAMUSCULAR | Status: DC | PRN
  Administered 2020-10-19: 25 ug via INTRAVENOUS

## 2020-10-19 MED ORDER — GENTAMICIN SULFATE 0.1 % EX CREA: 1.0000 "application " | TOPICAL_CREAM | Freq: Every day | CUTANEOUS | Status: DC

## 2020-10-19 MED ORDER — ONDANSETRON HCL 4 MG/2ML IJ SOLN: 4.0000 mg | Freq: Four times a day (QID) | INTRAMUSCULAR | Status: DC | PRN

## 2020-10-19 MED ORDER — LIDOCAINE HCL (PF) 1 % IJ SOLN
INTRAMUSCULAR | Status: AC
  Filled 2020-10-19: qty 30

## 2020-10-19 MED ORDER — SODIUM CHLORIDE 0.9% FLUSH: 3.0000 mL | Freq: Two times a day (BID) | INTRAVENOUS | Status: DC

## 2020-10-19 MED ORDER — HYDRALAZINE HCL 20 MG/ML IJ SOLN: 5.0000 mg | INTRAMUSCULAR | Status: DC | PRN

## 2020-10-19 MED ORDER — FENTANYL CITRATE (PF) 100 MCG/2ML IJ SOLN
INTRAMUSCULAR | Status: AC
  Filled 2020-10-19: qty 2

## 2020-10-19 MED ORDER — SODIUM CHLORIDE 0.9 % IV SOLN: INTRAVENOUS | Status: DC

## 2020-10-19 MED ORDER — MIDAZOLAM HCL 2 MG/2ML IJ SOLN
INTRAMUSCULAR | Status: DC | PRN
  Administered 2020-10-19: 1 mg via INTRAVENOUS

## 2020-10-19 MED ORDER — LABETALOL HCL 5 MG/ML IV SOLN: 10.0000 mg | INTRAVENOUS | Status: DC | PRN

## 2020-10-19 MED ORDER — SODIUM CHLORIDE 0.9 % IV SOLN: 250.0000 mL | INTRAVENOUS | Status: DC | PRN

## 2020-10-19 MED ORDER — SODIUM CHLORIDE 0.9% FLUSH: 3.0000 mL | INTRAVENOUS | Status: DC | PRN

## 2020-10-19 MED ORDER — HEPARIN (PORCINE) IN NACL 1000-0.9 UT/500ML-% IV SOLN
INTRAVENOUS | Status: DC | PRN
  Administered 2020-10-19 (×2): 500 mL

## 2020-10-19 MED ORDER — ACETAMINOPHEN 325 MG PO TABS: 650.0000 mg | ORAL_TABLET | ORAL | Status: DC | PRN

## 2020-10-19 MED ORDER — MIDAZOLAM HCL 2 MG/2ML IJ SOLN
INTRAMUSCULAR | Status: AC
  Filled 2020-10-19: qty 2

## 2020-10-19 MED ORDER — IODIXANOL 320 MG/ML IV SOLN
INTRAVENOUS | Status: DC | PRN
  Administered 2020-10-19: 52 mL

## 2020-10-19 MED ORDER — ASPIRIN 81 MG PO CHEW: 81.0000 mg | CHEWABLE_TABLET | ORAL | Status: DC

## 2020-10-19 MED ORDER — LIDOCAINE HCL (PF) 1 % IJ SOLN
INTRAMUSCULAR | Status: DC | PRN
  Administered 2020-10-19: 12 mL

## 2020-10-19 SURGICAL SUPPLY — 16 items
CATH ANGIO 5F PIGTAIL 65CM (CATHETERS) ×1 IMPLANT
CATH CROSS OVER TEMPO 5F (CATHETERS) ×1 IMPLANT
CATH SOFT-VU 4F 65 STRAIGHT (CATHETERS) IMPLANT
CATH SOFT-VU STRAIGHT 4F 65CM (CATHETERS) ×2
CATH STRAIGHT 5FR 65CM (CATHETERS) ×1 IMPLANT
DEVICE CLOSURE MYNXGRIP 5F (Vascular Products) ×1 IMPLANT
GUIDEWIRE ANGLED .035X150CM (WIRE) ×1 IMPLANT
KIT MICROPUNCTURE NIT STIFF (SHEATH) ×1 IMPLANT
KIT PV (KITS) ×2 IMPLANT
SHEATH PINNACLE 5F 10CM (SHEATH) ×1 IMPLANT
SHEATH PROBE COVER 6X72 (BAG) ×1 IMPLANT
SYR MEDRAD MARK 7 150ML (SYRINGE) ×2 IMPLANT
TRANSDUCER W/STOPCOCK (MISCELLANEOUS) ×2 IMPLANT
TRAY PV CATH (CUSTOM PROCEDURE TRAY) ×2 IMPLANT
TUBING CIL FLEX 10 FLL-RA (TUBING) ×1 IMPLANT
WIRE HITORQ VERSACORE ST 145CM (WIRE) ×1 IMPLANT

## 2020-10-19 NOTE — Telephone Encounter (Signed)
Patients daughter called about what to do about patient's bandages.

## 2020-10-19 NOTE — Telephone Encounter (Signed)
Called patient's daughter back (ok per DPR). Pt's daughter was wondering how to care for pt's wound/dressing from the procedure pt had this morning. Nurse referred pt to instructions given to patient, advised that she could also call number listed on AVS for further instructions of wound care. Nurse consulted with DOD Dr. Martinique who recommended pt could take off bandage tomorrow. Nurse advised pt that if pt has any concerning symptoms related to her site she should call 911 or be taken to the emergency room. Pt's daughter verbalized understanding. Nurse to forward to Dr. Fletcher Anon for review.

## 2020-10-19 NOTE — Interval H&P Note (Signed)
History and Physical Interval Note:  10/19/2020 10:28 AM  Shelby Walker  has presented today for surgery, with the diagnosis of pad.  The various methods of treatment have been discussed with the patient and family. After consideration of risks, benefits and other options for treatment, the patient has consented to  Procedure(s): ABDOMINAL AORTOGRAM W/LOWER EXTREMITY (N/A) as a surgical intervention.  The patient's history has been reviewed, patient examined, no change in status, stable for surgery.  I have reviewed the patient's chart and labs.  Questions were answered to the patient's satisfaction.     Kathlyn Sacramento

## 2020-10-20 NOTE — Telephone Encounter (Signed)
Called the daughter, per DPR. We went over the post procedure instructions for the patient. All questions were answered. She will call back if anything further is needed. She stated that the patient was doing really well.

## 2020-10-21 ENCOUNTER — Ambulatory Visit: Payer: Medicare Other | Admitting: Cardiovascular Disease

## 2020-10-24 ENCOUNTER — Other Ambulatory Visit: Payer: Self-pay

## 2020-10-24 ENCOUNTER — Ambulatory Visit (INDEPENDENT_AMBULATORY_CARE_PROVIDER_SITE_OTHER): Payer: Medicare Other | Admitting: Podiatry

## 2020-10-24 DIAGNOSIS — M79674 Pain in right toe(s): Secondary | ICD-10-CM

## 2020-10-24 DIAGNOSIS — M79675 Pain in left toe(s): Secondary | ICD-10-CM | POA: Diagnosis not present

## 2020-10-24 DIAGNOSIS — E08621 Diabetes mellitus due to underlying condition with foot ulcer: Secondary | ICD-10-CM | POA: Diagnosis not present

## 2020-10-24 DIAGNOSIS — B351 Tinea unguium: Secondary | ICD-10-CM

## 2020-10-24 DIAGNOSIS — L97512 Non-pressure chronic ulcer of other part of right foot with fat layer exposed: Secondary | ICD-10-CM | POA: Diagnosis not present

## 2020-10-24 NOTE — Progress Notes (Signed)
   Subjective:  87 y.o. female with PMHx of diabetes mellitus presenting today for follow-up evaluation of an ulcer to the plantar aspect of the right foot that has been present for several weeks.  Patient states that since last visit on 10/10/2020 she has improved significantly.  She no longer has the severe pain she was experiencing.  Overall there is significant improvement  Patient is also requesting a nail trim today.  She says that her toenails are thick and long and dystrophic and she is unable to trim her own nails due to her arthritis.   Past Medical History:  Diagnosis Date   Bruit    Carotid Doppler showed no significant abnormality     9per patient)   C. difficile colitis 07/2018   with severe sepsis   Chest pain, unspecified    Nuclear, May, 2008, no scar or ischemia   Diabetes mellitus    Diverticulosis    Dyslipidemia    Ejection fraction    EF 55-60%, echo, February, 2011 // Echocardiogram 8/21: EF 60-65, no RWMA, Gr 1 DD, GLS -14%, normal RVSF, mild LAE, trivial MR, mild MS (mean 4 mmHg), RVSP 23.4    GERD (gastroesophageal reflux disease)    Mitral regurgitation April 21, 2009   mild,  echo, February, 2011   Osteoporosis    Palpitations    possible very brief atrial fibrillation on monitor and possible reentrant tachycardia   Psoriasis    Rheumatoid arthritis ([place])       Objective/Physical Exam General: The patient is alert and oriented x3 in no acute distress.  Dermatology:  Wound #1 noted to the plantar aspect of the right first MTP joint has healed.  Complete reepithelialization has occurred.  No open wound noted   Vascular: Diminished pedal pulses bilaterally. No edema or erythema noted.   Neurological: Epicritic and protective threshold intact bilaterally.   Musculoskeletal Exam: Hammertoe and hallux valgus deformity noted bilateral  Assessment: 1.  Ulcers of the first MTP joint RT foot secondary to diabetes mellitus 2. diabetes mellitus w/  peripheral neuropathy   Plan of Care:  1. Patient was evaluated. 2.  Light debridement of the wound was performed using a tissue nipper without incident or bleeding.  The wound is healed completely.  Periwound callus area was lightly debrided with antibiotic ointment and a light dressing applied 3.  Mechanical debridement of nails 1-5 bilateral was performed using a nail nipper without incident or bleeding 4.  Return to clinic as needed  *Daughter's name is Toniann Ket, DPM [facility]  Dr. Edrick Kins, [address]                                        [place], [place] [address]                Office [phone]  Fax [phone]

## 2020-10-27 ENCOUNTER — Ambulatory Visit: Payer: Medicare Other | Admitting: Podiatry

## 2020-10-27 DIAGNOSIS — M069 Rheumatoid arthritis, unspecified: Secondary | ICD-10-CM | POA: Diagnosis not present

## 2020-11-03 ENCOUNTER — Telehealth: Payer: Self-pay | Admitting: *Deleted

## 2020-11-03 NOTE — Telephone Encounter (Signed)
Patient's daughter is calling with concerns about patient's foot which is starting to swell on the top and pain on bottom, ulcer looks ok,should she schedule a sooner appointment?Please advise.

## 2020-11-04 ENCOUNTER — Other Ambulatory Visit: Payer: Self-pay | Admitting: Podiatry

## 2020-11-04 ENCOUNTER — Ambulatory Visit (INDEPENDENT_AMBULATORY_CARE_PROVIDER_SITE_OTHER): Payer: Medicare Other

## 2020-11-04 ENCOUNTER — Other Ambulatory Visit: Payer: Self-pay

## 2020-11-04 ENCOUNTER — Telehealth: Payer: Self-pay | Admitting: *Deleted

## 2020-11-04 ENCOUNTER — Ambulatory Visit (INDEPENDENT_AMBULATORY_CARE_PROVIDER_SITE_OTHER): Payer: Medicare Other | Admitting: Podiatry

## 2020-11-04 DIAGNOSIS — L97512 Non-pressure chronic ulcer of other part of right foot with fat layer exposed: Secondary | ICD-10-CM

## 2020-11-04 DIAGNOSIS — E08621 Diabetes mellitus due to underlying condition with foot ulcer: Secondary | ICD-10-CM

## 2020-11-04 MED ORDER — DOXYCYCLINE HYCLATE 100 MG PO TABS: 100.0000 mg | ORAL_TABLET | Freq: Two times a day (BID) | ORAL | 0 refills | Status: DC

## 2020-11-04 NOTE — Telephone Encounter (Signed)
Changed to 10 days

## 2020-11-04 NOTE — Telephone Encounter (Signed)
"  I had brought her to the [place] office to see the doctor today.  He put her back on Doxycycline.  He stated that he was going to put her on a 10 day regimen twice a day.  I just wanted to confirm with you all because when we went to get the prescription at pharmacy it was only for seven days instead of the ten, that's what the doctor said.  Please call me to confirm or do we need additional days of the antibiotic?"

## 2020-11-04 NOTE — Telephone Encounter (Signed)
I called and informed Shelby Walker that Dr. Amalia Hailey wants her to be on the antibiotic for 10 days.  He sent a new prescription to the pharmacy.

## 2020-11-04 NOTE — Progress Notes (Signed)
   Subjective:  87 y.o. female with PMHx of diabetes mellitus presenting today for follow-up evaluation of an ulcer to the plantar aspect of the right foot that has been present for several weeks.  Patient states that now she is getting some redness and swelling to the right forefoot.  She became concerned.  They wanted to make sure things look fine.  They present for further treatment and evaluation   Past Medical History:  Diagnosis Date   Bruit    Carotid Doppler showed no significant abnormality     9per patient)   C. difficile colitis 07/2018   with severe sepsis   Chest pain, unspecified    Nuclear, May, 2008, no scar or ischemia   Diabetes mellitus    Diverticulosis    Dyslipidemia    Ejection fraction    EF 55-60%, echo, February, 2011 // Echocardiogram 8/21: EF 60-65, no RWMA, Gr 1 DD, GLS -14%, normal RVSF, mild LAE, trivial MR, mild MS (mean 4 mmHg), RVSP 23.4    GERD (gastroesophageal reflux disease)    Mitral regurgitation April 21, 2009   mild,  echo, February, 2011   Osteoporosis    Palpitations    possible very brief atrial fibrillation on monitor and possible reentrant tachycardia   Psoriasis    Rheumatoid arthritis ([place])       Objective/Physical Exam General: The patient is alert and oriented x3 in no acute distress.  Dermatology:  Wound #1 noted to the plantar aspect of the right first MTP joint has healed.  Complete reepithelialization has occurred.  No open wound noted   Vascular: Diminished pedal pulses bilaterally.  There is some mild localized erythema and edema around the first MTP joint of the right foot.  Neurological: Epicritic and protective threshold intact bilaterally.   Musculoskeletal Exam: Hammertoe and hallux valgus deformity noted bilateral  Radiographic exam: Arthrodesis of the first MTP joint right foot with lateral deviation/subluxation of the IPJ.  It appears the patient has had prior surgery of the metatarsal heads 2-5 right foot  with shortening.  Demineralization noted diffusely consistent with osteoporosis.  No fractures identified.  Assessment: 1.  Ulcers of the first MTP joint RT foot secondary to diabetes mellitus 2. diabetes mellitus w/ peripheral neuropathy 3.  Localized cellulitis right forefoot   Plan of Care:  1. Patient was evaluated. 2.  Light debridement of the wound was performed again today using a tissue nipper without incident or bleeding.  The wound continues to appear healed.  Periwound callus area was lightly debrided with antibiotic ointment and a light dressing applied.  No drainage noted. 3.  Prescription for doxycycline 100 mg 2 times daily #20 4.  Return to clinic 2 weeks  *Daughter's name is Toniann Ket, DPM [facility]  Dr. Edrick Kins, [address]                                        [place], [place] [address]                Office [phone]  Fax [phone]

## 2020-11-08 ENCOUNTER — Ambulatory Visit: Payer: Medicare Other | Admitting: Cardiovascular Disease

## 2020-11-14 ENCOUNTER — Encounter: Payer: Self-pay | Admitting: Podiatry

## 2020-11-16 ENCOUNTER — Ambulatory Visit (INDEPENDENT_AMBULATORY_CARE_PROVIDER_SITE_OTHER): Payer: Medicare Other | Admitting: Orthopaedic Surgery

## 2020-11-16 ENCOUNTER — Ambulatory Visit (INDEPENDENT_AMBULATORY_CARE_PROVIDER_SITE_OTHER): Payer: Medicare Other

## 2020-11-16 ENCOUNTER — Telehealth: Payer: Self-pay

## 2020-11-16 ENCOUNTER — Encounter: Payer: Self-pay | Admitting: Orthopaedic Surgery

## 2020-11-16 DIAGNOSIS — M79605 Pain in left leg: Secondary | ICD-10-CM

## 2020-11-16 DIAGNOSIS — G8929 Other chronic pain: Secondary | ICD-10-CM | POA: Diagnosis not present

## 2020-11-16 DIAGNOSIS — M25562 Pain in left knee: Secondary | ICD-10-CM

## 2020-11-16 DIAGNOSIS — I6523 Occlusion and stenosis of bilateral carotid arteries: Secondary | ICD-10-CM | POA: Diagnosis not present

## 2020-11-16 NOTE — Telephone Encounter (Signed)
Please get auth for left knee gel injection-Dr. Blackman pt °

## 2020-11-16 NOTE — Addendum Note (Signed)
Addended by: Robyne Peers on: 11/16/2020 11:16 AM   Modules accepted: Orders

## 2020-11-16 NOTE — Progress Notes (Signed)
Office Visit Note   Patient: Shelby Walker           Date of Birth: April 13, 1933           MRN: [id number] Visit Date: 11/16/2020              Requested by: Reynold Bowen, MD [address] [place],  [place] [address] PCP: Reynold Bowen, MD   Assessment & Plan: Visit Diagnoses:  1. Chronic pain of left knee   2. Pain in left leg     Plan: From a knee standpoint, my next step would be ordering hyaluronic acid for the left knee considering the failure of conservative treatment including steroid injections.  She will continue down with a cane.  I would also like to send her for MRI of her lumbar spine due to the profound arthritic changes and degenerative disc changes in the lumbar spine combined with the left-sided radicular symptoms.  This will help determine whether or not she would benefit from a left-sided epidural steroid injection and at what level.  This is all in keeping with conservative treatment.  They agree with this treatment plan.  We will see her back in follow-up to go over the MRI of her lumbar spine and hopefully placed hyaluronic acid into the left knee.  Follow-Up Instructions: Return in about 3 weeks (around 12/07/2020).   Orders:  Orders Placed This Encounter  Procedures   XR Knee 1-2 Views Left   XR Lumbar Spine 2-3 Views   No orders of the defined types were placed in this encounter.     Procedures: No procedures performed   Clinical Data: No additional findings.   Subjective: Chief Complaint  Patient presents with   Left Leg - Pain   Left Knee - Pain  The patient comes in today for evaluation treatment of left knee pain but also left leg pain and left-sided low back pain.  She is 87 years old.  She does ambulate with a cane.  Her daughter is with her today.  She has been having steroid injections about every 3 to 4 months in her left knee and has gotten to where it does not really help her.  She is never had hyaluronic acid injections.  She gets  numbness and tingling down her left leg at times and left leg weakness.  She does take tramadol and Tylenol for pain.  She is not really interested in any type of surgical intervention.  They just want a get an idea potentially other treatment modalities for what is going on with her.  She denies any change in bowel bladder function.  She is a thin 87 year old female.  HPI  Review of Systems There is currently listed no headache, chest pain, shortness of breath, fever, chills, nausea, vomiting  Objective: Vital Signs: There were no vitals taken for this visit.  Physical Exam She is alert and orient x3 and in no acute distress Ortho Exam Examination of her left knee shows no effusion.  She actually has full range of motion of that left knee and is ligamentously stable.  There is some mild patellar-femoral crepitation but no instability of the knee on exam and not a lot of pain.  She does seem to have some weakness in that left leg and radicular symptoms going all the way down to her left foot.  There is a positive straight leg raise on the left side.  There is limited flexion extension of her lumbar spine and  pain to palpation of the lower lumbar spine into the left side. Specialty Comments:  No specialty comments available.  Imaging: XR Knee 1-2 Views Left  Result Date: 11/16/2020 2 views of the left knee show no acute findings.  There is patellofemoral arthritic changes but the medial lateral joint line spaces still appear well-maintained.  XR Lumbar Spine 2-3 Views  Result Date: 11/16/2020 2 views lumbar spine show severe arthritic changes at multiple levels.  There is complete loss of the disc space at L5-S1.  There is loss of lumbar lordosis and significant degenerative changes and spondylolisthesis at several levels.    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 08/27/2018   Thrombocytopenia ([place])    Essential hypertension    Dyspnea on exertion    Steroid-induced hyperglycemia    Fatigue 08/06/2018   Paroxysmal atrial fibrillation ([place])    Mixed hyperlipidemia 09/29/2016   Stable angina ([place]) 09/29/2016   PAD (peripheral artery disease) ([place]) 08/09/2016   Preoperative clearance 05/21/2012   Precordial chest pain    Palpitations    Diabetes mellitus ([facility])    GERD (gastroesophageal reflux disease)    Ejection fraction    Carotid artery disease ([place])    Mitral regurgitation 04/21/2009   PSORIASIS 02/09/2009   Rheumatoid arthritis flare ([place]) 02/09/2009   Past Medical History:  Diagnosis Date   Bruit    Carotid Doppler showed no significant abnormality     9per patient)   C. difficile colitis 07/2018   with severe sepsis   Chest pain, unspecified    Nuclear, May, 2008, no scar or ischemia   Diabetes mellitus    Diverticulosis    Dyslipidemia    Ejection fraction    EF 55-60%, echo, February, 2011 // Echocardiogram 8/21: EF 60-65, no RWMA, Gr 1 DD, GLS -14%, normal RVSF, mild LAE, trivial MR, mild MS (mean 4 mmHg), RVSP 23.4    GERD (gastroesophageal reflux disease)    Mitral regurgitation April 21, 2009   mild,  echo, February, 2011   Osteoporosis    Palpitations    possible very brief atrial fibrillation on monitor and possible reentrant tachycardia   Psoriasis    Rheumatoid arthritis ([place])     Family History  Problem Relation Age of Onset   Heart failure Father    Heart attack Father    Diabetes Father    Heart attack Mother    Heart failure Mother    Diabetes Mother    Stroke Sister      Past Surgical History:  Procedure Laterality Date   ABDOMINAL AORTOGRAM W/LOWER EXTREMITY Right 10/19/2020   Procedure: ABDOMINAL AORTOGRAM W/LOWER EXTREMITY;  Surgeon: Wellington Hampshire, MD;  Location: [facility];  Service: Cardiovascular;  Laterality: Right;   FRACTURE SURGERY     Social History   Occupational History    Employer: RETIRED  Tobacco Use   Smoking status: Never   Smokeless tobacco: Never  Vaping Use   Vaping Use: Never used  Substance and Sexual Activity   Alcohol use: No    Alcohol/week: 0.0 standard drinks  Drug use: No   Sexual activity: Yes    Birth control/protection: Post-menopausal

## 2020-11-17 ENCOUNTER — Telehealth: Payer: Self-pay | Admitting: Orthopaedic Surgery

## 2020-11-17 ENCOUNTER — Other Ambulatory Visit: Payer: Self-pay | Admitting: Orthopaedic Surgery

## 2020-11-17 MED ORDER — ACETAMINOPHEN-CODEINE #3 300-30 MG PO TABS: 1.0000 | ORAL_TABLET | Freq: Three times a day (TID) | ORAL | 0 refills | Status: DC | PRN

## 2020-11-17 NOTE — Telephone Encounter (Signed)
Pt daughter called back stating someone called her back. Can you please call her? [phone]

## 2020-11-17 NOTE — Telephone Encounter (Signed)
Pt's daughter [name] called with medical questions about her mother. Please call Lisa [phone].

## 2020-11-17 NOTE — Telephone Encounter (Signed)
Called and advised daughter. She was grateful

## 2020-11-17 NOTE — Telephone Encounter (Signed)
I called and talked to the daughter. She wanted to know if Dr. Ninfa Linden can give her mom something other than tramadol for pain. She cant take norco. Please advise

## 2020-11-18 ENCOUNTER — Other Ambulatory Visit: Payer: Self-pay

## 2020-11-18 ENCOUNTER — Ambulatory Visit (INDEPENDENT_AMBULATORY_CARE_PROVIDER_SITE_OTHER): Payer: Medicare Other | Admitting: Podiatry

## 2020-11-18 DIAGNOSIS — L97512 Non-pressure chronic ulcer of other part of right foot with fat layer exposed: Secondary | ICD-10-CM | POA: Diagnosis not present

## 2020-11-18 DIAGNOSIS — E08621 Diabetes mellitus due to underlying condition with foot ulcer: Secondary | ICD-10-CM | POA: Diagnosis not present

## 2020-11-18 NOTE — Telephone Encounter (Signed)
Noted  

## 2020-11-22 ENCOUNTER — Other Ambulatory Visit: Payer: Self-pay | Admitting: Orthopaedic Surgery

## 2020-11-22 ENCOUNTER — Encounter: Payer: Self-pay | Admitting: Orthopaedic Surgery

## 2020-11-22 ENCOUNTER — Telehealth: Payer: Self-pay

## 2020-11-22 MED ORDER — GABAPENTIN 100 MG PO CAPS: 100.0000 mg | ORAL_CAPSULE | Freq: Three times a day (TID) | ORAL | 1 refills | Status: DC | PRN

## 2020-11-22 NOTE — Telephone Encounter (Signed)
VOB submitted for Monovisc, left knee. Pending BV.

## 2020-11-23 ENCOUNTER — Ambulatory Visit
Admission: RE | Admit: 2020-11-23 | Discharge: 2020-11-23 | Disposition: A | Discharge status: Home / Self Care | Payer: Medicare Other | Source: Ambulatory Visit | Attending: Orthopaedic Surgery | Admitting: Orthopaedic Surgery

## 2020-11-23 ENCOUNTER — Other Ambulatory Visit: Payer: Self-pay

## 2020-11-23 ENCOUNTER — Telehealth: Payer: Self-pay

## 2020-11-23 DIAGNOSIS — Z23 Encounter for immunization: Secondary | ICD-10-CM | POA: Diagnosis not present

## 2020-11-23 DIAGNOSIS — M48061 Spinal stenosis, lumbar region without neurogenic claudication: Secondary | ICD-10-CM | POA: Diagnosis not present

## 2020-11-23 DIAGNOSIS — M79605 Pain in left leg: Secondary | ICD-10-CM

## 2020-11-23 DIAGNOSIS — M545 Low back pain, unspecified: Secondary | ICD-10-CM | POA: Diagnosis not present

## 2020-11-23 NOTE — Telephone Encounter (Signed)
Approved for Monovisc, left knee. [place] deductible has been met Patient will be responsible for 20% OOP. No Co-pay  Appt. 12/07/2020 with Dr. Ninfa Linden

## 2020-11-24 ENCOUNTER — Other Ambulatory Visit: Payer: Medicare Other

## 2020-11-24 DIAGNOSIS — M810 Age-related osteoporosis without current pathological fracture: Secondary | ICD-10-CM | POA: Diagnosis not present

## 2020-11-24 DIAGNOSIS — Z79899 Other long term (current) drug therapy: Secondary | ICD-10-CM | POA: Diagnosis not present

## 2020-11-24 DIAGNOSIS — Z6823 Body mass index (BMI) 23.0-23.9, adult: Secondary | ICD-10-CM | POA: Diagnosis not present

## 2020-11-24 DIAGNOSIS — M7062 Trochanteric bursitis, left hip: Secondary | ICD-10-CM | POA: Diagnosis not present

## 2020-11-24 DIAGNOSIS — M069 Rheumatoid arthritis, unspecified: Secondary | ICD-10-CM | POA: Diagnosis not present

## 2020-11-24 DIAGNOSIS — M5136 Other intervertebral disc degeneration, lumbar region: Secondary | ICD-10-CM | POA: Diagnosis not present

## 2020-11-24 DIAGNOSIS — L409 Psoriasis, unspecified: Secondary | ICD-10-CM | POA: Diagnosis not present

## 2020-11-24 DIAGNOSIS — M150 Primary generalized (osteo)arthritis: Secondary | ICD-10-CM | POA: Diagnosis not present

## 2020-11-25 DIAGNOSIS — R5383 Other fatigue: Secondary | ICD-10-CM | POA: Diagnosis not present

## 2020-11-25 DIAGNOSIS — Z79899 Other long term (current) drug therapy: Secondary | ICD-10-CM | POA: Diagnosis not present

## 2020-11-25 DIAGNOSIS — M069 Rheumatoid arthritis, unspecified: Secondary | ICD-10-CM | POA: Diagnosis not present

## 2020-11-25 NOTE — Progress Notes (Signed)
Subjective:  87 y.o. female with PMHx of diabetes mellitus presenting today for follow-up evaluation of an ulcer to the plantar aspect of the right foot that has been present for several weeks.  Patient states overall she is doing much better.  She does have some minor swelling and drainage to the area.  Minimal soreness.  She presents for further treatment and evaluation   Past Medical History:  Diagnosis Date   Bruit    Carotid Doppler showed no significant abnormality     9per patient)   C. difficile colitis 07/2018   with severe sepsis   Chest pain, unspecified    Nuclear, May, 2008, no scar or ischemia   Diabetes mellitus    Diverticulosis    Dyslipidemia    Ejection fraction    EF 55-60%, echo, February, 2011 // Echocardiogram 8/21: EF 60-65, no RWMA, Gr 1 DD, GLS -14%, normal RVSF, mild LAE, trivial MR, mild MS (mean 4 mmHg), RVSP 23.4    GERD (gastroesophageal reflux disease)    Mitral regurgitation April 21, 2009   mild,  echo, February, 2011   Osteoporosis    Palpitations    possible very brief atrial fibrillation on monitor and possible reentrant tachycardia   Psoriasis    Rheumatoid arthritis ([place])       Objective/Physical Exam General: The patient is alert and oriented x3 in no acute distress.  Dermatology:  Wound #1 noted to the plantar aspect of the right first MTP joint continues to be healed.  Complete reepithelialization has occurred.  No open wound noted.  No drainage.  No erythema or edema.   Vascular: Diminished pedal pulses bilaterally.  It appears that the localized erythema and edema around the first MTP joint is resolved  Neurological: Epicritic and protective threshold intact bilaterally.   Musculoskeletal Exam: Hammertoe and hallux valgus deformity noted bilateral  Radiographic exam taken last visit 11/04/2020 RT foot: Arthrodesis of the first MTP joint right foot with lateral deviation/subluxation of the IPJ.  It appears the patient has had  prior surgery of the metatarsal heads 2-5 right foot with shortening.  Demineralization noted diffusely consistent with osteoporosis.  No fractures identified.  Assessment: 1.  Ulcers of the first MTP joint RT foot secondary to diabetes mellitus; healed 2. diabetes mellitus w/ peripheral neuropathy 3.  Localized cellulitis right forefoot; resolved   Plan of Care:  1. Patient was evaluated. 2.  Light debridement of the wound was performed again today using a tissue nipper without incident or bleeding.  The wound continues to appear healed.  Periwound callus area was lightly debrided with antibiotic ointment and a light dressing applied.  No drainage noted. 3.  Additional offloading felt dancers pads was applied to the insoles of the shoes to offload pressure from the first MTP joint 4.  Patient resumes her IV infusion in 1 week.  She may resume.  No clinical evidence of infection 5.  Return to clinic in 2 weeks  *Daughter's name is Toniann Ket, DPM [facility]  Dr. Edrick Kins, [address]                                        [place], [place] [address]                Office [phone]  Fax [phone]

## 2020-11-28 ENCOUNTER — Telehealth: Payer: Self-pay | Admitting: Orthopaedic Surgery

## 2020-11-28 DIAGNOSIS — M79605 Pain in left leg: Secondary | ICD-10-CM

## 2020-11-28 NOTE — Telephone Encounter (Signed)
Pt daughter call and was wondering if she get an injection in her back. She states the MRI results should be back. She wants someone to call her   CB [phone]

## 2020-11-28 NOTE — Telephone Encounter (Signed)
I called the daughter and informed. She stated understanding. She would like to know how far dr. Ernestina Patches is booked out. Can you please advise?

## 2020-11-28 NOTE — Telephone Encounter (Signed)
Referral placed in chart  

## 2020-11-28 NOTE — Addendum Note (Signed)
Addended by: Robyne Peers on: 11/28/2020 11:29 AM   Modules accepted: Orders

## 2020-12-07 ENCOUNTER — Encounter: Payer: Self-pay | Admitting: Podiatry

## 2020-12-07 ENCOUNTER — Other Ambulatory Visit: Payer: Self-pay

## 2020-12-07 ENCOUNTER — Ambulatory Visit (INDEPENDENT_AMBULATORY_CARE_PROVIDER_SITE_OTHER): Payer: Medicare Other | Admitting: Orthopaedic Surgery

## 2020-12-07 ENCOUNTER — Other Ambulatory Visit: Payer: Self-pay | Admitting: Orthopaedic Surgery

## 2020-12-07 ENCOUNTER — Ambulatory Visit (INDEPENDENT_AMBULATORY_CARE_PROVIDER_SITE_OTHER): Payer: Medicare Other | Admitting: Podiatry

## 2020-12-07 DIAGNOSIS — L84 Corns and callosities: Secondary | ICD-10-CM

## 2020-12-07 DIAGNOSIS — M1712 Unilateral primary osteoarthritis, left knee: Secondary | ICD-10-CM

## 2020-12-07 MED ORDER — LIDOCAINE HCL 1 % IJ SOLN
3.0000 mL | INTRAMUSCULAR | Status: AC | PRN
  Administered 2020-12-07: 3 mL

## 2020-12-07 NOTE — Telephone Encounter (Signed)
ok 

## 2020-12-07 NOTE — Progress Notes (Signed)
   Procedure Note  Patient: Shelby Walker             Date of Birth: Oct 11, 1933           MRN: [id number]             Visit Date: 12/07/2020  HPI: Shelby Walker comes in today for scheduled left knee Monovisc injection.  She reports no new injury to the knee.  She has failed conservative treatment which has consisted of cortisone injections using assistive device to ambulate.  She has known osteoarthritis left knee.  She has no scheduled surgery for the left knee in the next 6 months.  Physical exam: Left knee good range of motion.  No abnormal warmth erythema or effusion.  Procedures: Visit Diagnoses:  1. Primary osteoarthritis of left knee     Large Joint Inj: L knee on 12/07/2020 10:48 AM Indications: pain Details: 22 G 1.5 in needle, anterolateral approach  Arthrogram: No  Medications: 3 mL lidocaine 1 % Outcome: tolerated well, no immediate complications Procedure, treatment alternatives, risks and benefits explained, specific risks discussed. Consent was given by the patient. Immediately prior to procedure a time out was called to verify the correct patient, procedure, equipment, support staff and site/side marked as required. Patient was prepped and draped in the usual sterile fashion.    Plan: She understands to wait at least 6 months between supplemental injections.  She will follow-up with [place] on as-needed basis.  Questions encouraged and answered at length.

## 2020-12-07 NOTE — Progress Notes (Signed)
  Subjective:  Patient ID: Shelby Walker, female    DOB: September 28, 1933,   MRN: [id number]  No chief complaint on file.   87 y.o. female presents for follow-up of ulcer for right foot. She has been in the care of Dr. Amalia Hailey. States she feels the wound is closed now. Daughter here today and concerned for swelling in the leg for the past week and wanted it evaluated.  . Denies any other pedal complaints. Denies n/v/f/c.   Past Medical History:  Diagnosis Date   Bruit    Carotid Doppler showed no significant abnormality     9per patient)   C. difficile colitis 07/2018   with severe sepsis   Chest pain, unspecified    Nuclear, May, 2008, no scar or ischemia   Diabetes mellitus    Diverticulosis    Dyslipidemia    Ejection fraction    EF 55-60%, echo, February, 2011 // Echocardiogram 8/21: EF 60-65, no RWMA, Gr 1 DD, GLS -14%, normal RVSF, mild LAE, trivial MR, mild MS (mean 4 mmHg), RVSP 23.4    GERD (gastroesophageal reflux disease)    Mitral regurgitation April 21, 2009   mild,  echo, February, 2011   Osteoporosis    Palpitations    possible very brief atrial fibrillation on monitor and possible reentrant tachycardia   Psoriasis    Rheumatoid arthritis ([place])     Objective:  Physical Exam: Vascular: DP/PT pulses 2/4 bilateral. CFT <3 seconds. Normal hair growth on digits. Mild edema noted to bilateral lower extremities.  Skin. No lacerations or abrasions bilateral feet. Healed ulcer to right foot. No erythema or edema present. Some overlying hyperkeratosis.  Musculoskeletal: MMT 5/5 bilateral lower extremities in DF, PF, Inversion and Eversion. Deceased ROM in DF of ankle joint. No tenderness to palpation.  Neurological: Sensation intact to light touch.   Assessment:   1. Pre-ulcerative calluses      Plan:  Patient was evaluated and treated and all questions answered. Hyperkeratotic tissue debrided no underlying ulcer noted. No drainage noted. Advised to continue with  compression stocking and elevating as necessary.  Will follow-up in 3 months with Dr. Amalia Hailey for routine care.   Lorenda Peck, DPM

## 2020-12-08 ENCOUNTER — Ambulatory Visit (INDEPENDENT_AMBULATORY_CARE_PROVIDER_SITE_OTHER): Payer: Medicare Other | Admitting: Physical Medicine and Rehabilitation

## 2020-12-08 ENCOUNTER — Encounter: Payer: Self-pay | Admitting: Physical Medicine and Rehabilitation

## 2020-12-08 ENCOUNTER — Ambulatory Visit: Payer: Self-pay

## 2020-12-08 VITALS — BP 152/52 | HR 86

## 2020-12-08 DIAGNOSIS — M48061 Spinal stenosis, lumbar region without neurogenic claudication: Secondary | ICD-10-CM | POA: Diagnosis not present

## 2020-12-08 DIAGNOSIS — M25562 Pain in left knee: Secondary | ICD-10-CM

## 2020-12-08 DIAGNOSIS — M5416 Radiculopathy, lumbar region: Secondary | ICD-10-CM

## 2020-12-08 DIAGNOSIS — G8929 Other chronic pain: Secondary | ICD-10-CM | POA: Diagnosis not present

## 2020-12-08 DIAGNOSIS — I6523 Occlusion and stenosis of bilateral carotid arteries: Secondary | ICD-10-CM | POA: Diagnosis not present

## 2020-12-08 NOTE — Progress Notes (Signed)
Pt state lower back pain that travels down her left leg. Pt state walking, standing and laying down makes the pain worse. Pt state she takes pain meds and uses heat to help ease her pain.  Numeric Pain Rating Scale and Functional Assessment Average Pain 10   In the last MONTH (on 0-10 scale) has pain interfered with the following?  1. General activity like being  able to carry out your everyday physical activities such as walking, climbing stairs, carrying groceries, or moving a chair?  Rating(10)   +Driver, -BT, -Dye Allergies.

## 2020-12-08 NOTE — Patient Instructions (Signed)

## 2020-12-11 ENCOUNTER — Encounter: Payer: Self-pay | Admitting: Physical Medicine and Rehabilitation

## 2020-12-11 NOTE — Procedures (Signed)
Lumbosacral Transforaminal Epidural Steroid Injection - Sub-Pedicular Approach with Fluoroscopic Guidance  Patient: Shelby Walker      Date of Birth: 07/09/1933 MRN: [id number] PCP: Reynold Bowen, MD      Visit Date: 12/08/2020   Universal Protocol:    Date/Time: 12/08/2020  Consent Given By: the patient  Position: PRONE  Additional Comments: Vital signs were monitored before and after the procedure. Patient was prepped and draped in the usual sterile fashion. The correct patient, procedure, and site was verified.   Injection Procedure Details:   Procedure diagnoses: Radiculopathy, lumbar region [M54.16]    Meds Administered: 80 mg Depo-Medrol  Laterality: Left  Location/Site: L4  Needle:5.0 in., 22 ga.  Short bevel or Quincke spinal needle  Needle Placement: Transforaminal  Findings:    -Comments: Excellent flow of contrast along the nerve, nerve root and into the epidural space.  Procedure Details: After squaring off the end-plates to get a true AP view, the C-arm was positioned so that an oblique view of the foramen as noted above was visualized. The target area is just inferior to the "nose of the scotty dog" or sub pedicular. The soft tissues overlying this structure were infiltrated with 2-3 ml. of 1% Lidocaine without Epinephrine.  The spinal needle was inserted toward the target using a "trajectory" view along the fluoroscope beam.  Under AP and lateral visualization, the needle was advanced so it did not puncture dura and was located close the 6 O'Clock position of the pedical in AP tracterory. Biplanar projections were used to confirm position. Aspiration was confirmed to be negative for CSF and/or blood. A 1-2 ml. volume of Isovue-250 was injected and flow of contrast was noted at each level. Radiographs were obtained for documentation purposes.   After attaining the desired flow of contrast documented above, a 0.5 to 1.0 ml test dose of 0.25% Marcaine was injected  into each respective transforaminal space.  The patient was observed for 90 seconds post injection.  After no sensory deficits were reported, and normal lower extremity motor function was noted,   the above injectate was administered so that equal amounts of the injectate were placed at each foramen (level) into the transforaminal epidural space.   Additional Comments:  No complications occurred Dressing: 2 x 2 sterile gauze and Band-Aid    Post-procedure details: Patient was observed during the procedure. Post-procedure instructions were reviewed.  Patient left the clinic in stable condition.

## 2020-12-11 NOTE — Progress Notes (Signed)
Shelby Walker - 87 y.o. female MRN [id number]  Date of birth: 03-03-1934  Office Visit Note: Visit Date: 12/08/2020 PCP: Reynold Bowen, MD Referred by: Reynold Bowen, MD  Subjective: No chief complaint on file.  HPI: Shelby Walker is a 87 y.o. female who comes in todayAt the request of Dr. Jean Rosenthal for evaluation and management of chronic severe debilitating left hip and leg pain to the knee and just past the knee.  She also has concomitant severe knee pain in general for which she has osteoarthritis and has been treated with cortisone injections and is now into a series of viscosupplementation through Dr. Ninfa Linden.  She has been having this left hip and leg pain for about 4 months with no specific injury.  She denies any similar complaints on the right.  She does get some symptoms past the knee and more of a classic L4 distribution if it is radicular.  She does get some cramping at times that is worse with standing and ambulating.  Somewhat worse in the morning.  Her history is complicated by rheumatoid arthritis and uncontrolled diabetes.  Last hemoglobin A1c that I can find was 8.8 but this was a couple years ago.  She has no history of prior lumbar surgery or injections.  She has failed conservative care including medication management home exercise and therapy as well as activity modification.  Dr. Ninfa Linden did obtain MRI of the lumbar spine and we did review that today with her and her daughter using spine models and imaging.  This shows stairstep listhesis grade 1 small L3 on L4, L4 on 5 and L5 on S1.  There is moderate severe general stenosis at L3-4 and severe at L4-5.  Multilevel facet arthropathy.  Review of Systems  Musculoskeletal:  Positive for back pain and joint pain.  Neurological:  Positive for tingling.  All other systems reviewed and are negative. Otherwise per HPI.  Assessment & Plan: Visit Diagnoses:    ICD-10-CM   1. Radiculopathy, lumbar region  M54.16 XR C-ARM  NO REPORT    Epidural Steroid injection    2. Spinal stenosis of lumbar region without neurogenic claudication  M48.061     3. Chronic pain of left knee  M25.562    G89.29        Plan: Findings:  Chronic worsening severe 85-month history of radicular leg pain on the left and a consistent L4 dermatome and pattern.  Severe enough that it really is limiting what she can do at this point and she is very frustrated with amount of pain that she is having.  Nothing so far is helped her.  MRI shows pretty significant spine disease.  She also has knee arthritis undergoing knee injections which have only been somewhat beneficial.  Knee pain could be part of the L4 pattern as well.  Do to the amount of pain that she is having we did complete the L4 transforaminal injection today diagnostically.  I will have her follow-up with [place] in a few weeks concerning her spine we can make recommendations whether it is a repeat injection or may be a different approach etc.   Meds & Orders: No orders of the defined types were placed in this encounter.   Orders Placed This Encounter  Procedures   XR C-ARM NO REPORT   Epidural Steroid injection    Follow-up: Return in about 2 weeks (around 12/22/2020).   Procedures: No procedures performed  Lumbosacral Transforaminal Epidural Steroid Injection - Sub-Pedicular Approach  with Fluoroscopic Guidance  Patient: Shelby Walker      Date of Birth: 08-28-1933 MRN: [id number] PCP: Reynold Bowen, MD      Visit Date: 12/08/2020   Universal Protocol:    Date/Time: 12/08/2020  Consent Given By: the patient  Position: PRONE  Additional Comments: Vital signs were monitored before and after the procedure. Patient was prepped and draped in the usual sterile fashion. The correct patient, procedure, and site was verified.   Injection Procedure Details:   Procedure diagnoses: Radiculopathy, lumbar region [M54.16]    Meds Administered: 80 mg Depo-Medrol  Laterality:  Left  Location/Site: L4  Needle:5.0 in., 22 ga.  Short bevel or Quincke spinal needle  Needle Placement: Transforaminal  Findings:    -Comments: Excellent flow of contrast along the nerve, nerve root and into the epidural space.  Procedure Details: After squaring off the end-plates to get a true AP view, the C-arm was positioned so that an oblique view of the foramen as noted above was visualized. The target area is just inferior to the "nose of the scotty dog" or sub pedicular. The soft tissues overlying this structure were infiltrated with 2-3 ml. of 1% Lidocaine without Epinephrine.  The spinal needle was inserted toward the target using a "trajectory" view along the fluoroscope beam.  Under AP and lateral visualization, the needle was advanced so it did not puncture dura and was located close the 6 O'Clock position of the pedical in AP tracterory. Biplanar projections were used to confirm position. Aspiration was confirmed to be negative for CSF and/or blood. A 1-2 ml. volume of Isovue-250 was injected and flow of contrast was noted at each level. Radiographs were obtained for documentation purposes.   After attaining the desired flow of contrast documented above, a 0.5 to 1.0 ml test dose of 0.25% Marcaine was injected into each respective transforaminal space.  The patient was observed for 90 seconds post injection.  After no sensory deficits were reported, and normal lower extremity motor function was noted,   the above injectate was administered so that equal amounts of the injectate were placed at each foramen (level) into the transforaminal epidural space.   Additional Comments:  No complications occurred Dressing: 2 x 2 sterile gauze and Band-Aid    Post-procedure details: Patient was observed during the procedure. Post-procedure instructions were reviewed.  Patient left the clinic in stable condition.    Clinical History: MRI LUMBAR SPINE WITHOUT CONTRAST    TECHNIQUE: Multiplanar, multisequence MR imaging of the lumbar spine was performed. No intravenous contrast was administered.   COMPARISON:  RADIOGRAPHY OF THE LUMBAR DATED 11/16/2020. RADIOGRAPHY OF THE LUMBAR DATED 08/07/2018.   FINDINGS: Segmentation:  Standard.   Alignment: Grade 1 anterolisthesis of L3 on L4, L4 on L5 and L5-S1 secondary to facet disease.   Vertebrae: No acute fracture, evidence of discitis, or aggressive bone lesion.   Conus medullaris and cauda equina: Conus extends to the L1-2 level. Conus and cauda equina appear normal.   Paraspinal and other soft tissues: No acute paraspinal abnormality. Right renal cyst.   Disc levels:   Disc spaces: Degenerative disease with disc height loss at L3-4, L4-5 and L5-S1.   T12-L1: Minimal broad-based disc bulge. No foraminal or central canal stenosis.   L1-L2: No significant disc bulge. No neural foraminal stenosis. No central canal stenosis.   L2-L3: Minimal broad-based disc bulge. Mild bilateral facet arthropathy. No foraminal or central canal stenosis.   L3-L4: Broad-based disc bulge with a broad left  paracentral disc protrusion with mass effect on the left intraspinal L4 nerve root. Moderate bilateral facet arthropathy. Moderate-severe spinal stenosis. Left subarticular recess stenosis. Moderate left foraminal stenosis. Mild right foraminal stenosis.   L4-L5: Broad-based disc bulge with a broad central disc protrusion. Severe bilateral facet arthropathy with ligamentum flavum infolding. Severe spinal stenosis. Mild right and severe left foraminal stenosis.   L5-S1: Mild broad-based disc bulge with a broad shallow right paracentral disc protrusion with mass effect on the right intraspinal S1 nerve root. Mild bilateral foraminal stenosis. Mild bilateral facet arthropathy. No spinal stenosis.   IMPRESSION: 1. Lumbar spine spondylosis as described above most severe at L3-4, L4-5 and L5-S1. 2. No acute  osseous injury of the lumbar spine.     Electronically Signed   By: Kathreen Devoid M.D.   On: 11/23/2020 08:11   She reports that she has never smoked. She has never used smokeless tobacco. No results for input(s): HGBA1C, LABURIC in the last 8760 hours.  Objective:  VS:  HT:    WT:   BMI:     BP:(!) 152/52  HR:86bpm  TEMP: ( )  RESP:  Physical Exam Vitals and nursing note reviewed.  Constitutional:      General: She is not in acute distress.    Appearance: Normal appearance. She is not ill-appearing.  HENT:     Head: Normocephalic and atraumatic.     Right Ear: External ear normal.     Left Ear: External ear normal.  Eyes:     Extraocular Movements: Extraocular movements intact.  Cardiovascular:     Rate and Rhythm: Normal rate.     Pulses: Normal pulses.  Pulmonary:     Effort: Pulmonary effort is normal. No respiratory distress.  Abdominal:     General: There is no distension.     Palpations: Abdomen is soft.  Musculoskeletal:        General: Tenderness present.     Cervical back: Neck supple.     Right lower leg: No edema.     Left lower leg: No edema.     Comments: Patient has good distal strength with no pain over the greater trochanters.  No clonus or focal weakness.  She has concordant pain with extension of the lumbar spine and facet loading.  She does have joint line tenderness about the left knee compared to right.  Good varus and valgus stability.  Skin:    Findings: No erythema, lesion or rash.  Neurological:     General: No focal deficit present.     Mental Status: She is alert and oriented to person, place, and time.     Sensory: No sensory deficit.     Motor: No weakness or abnormal muscle tone.     Coordination: Coordination normal.     Gait: Gait abnormal.  Psychiatric:        Mood and Affect: Mood normal.        Behavior: Behavior normal.    Ortho Exam  Imaging: No results found.  Past Medical/Family/Surgical/Social History: Medications &  Allergies reviewed per EMR, new medications updated. 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 08/27/2018   Thrombocytopenia ([place])    Essential hypertension    Dyspnea on exertion    Steroid-induced hyperglycemia    Fatigue 08/06/2018   Paroxysmal atrial fibrillation ([facility])    Mixed hyperlipidemia 09/29/2016   Stable angina ([place]) 09/29/2016   PAD (peripheral artery disease) ([place]) 08/09/2016   Preoperative clearance 05/21/2012   Precordial chest pain    Palpitations    Diabetes mellitus ([facility])    GERD (gastroesophageal reflux disease)    Ejection fraction    Carotid artery disease ([place])    Mitral regurgitation 04/21/2009   PSORIASIS 02/09/2009   Rheumatoid arthritis flare ([place]) 02/09/2009   Past Medical History:  Diagnosis Date   Bruit    Carotid Doppler showed no significant abnormality     9per patient)   C. difficile colitis 07/2018   with severe sepsis   Chest pain, unspecified    Nuclear, May, 2008, no scar or ischemia   Diabetes mellitus    Diverticulosis    Dyslipidemia    Ejection fraction    EF 55-60%, echo, February, 2011 // Echocardiogram 8/21: EF 60-65, no RWMA, Gr 1 DD, GLS -14%, normal RVSF, mild LAE, trivial MR, mild MS (mean 4 mmHg), RVSP 23.4    GERD (gastroesophageal reflux disease)    Mitral regurgitation April 21, 2009   mild,  echo, February, 2011   Osteoporosis    Palpitations    possible very brief atrial fibrillation on monitor and possible reentrant tachycardia   Psoriasis    Rheumatoid arthritis ([place])     Family History  Problem Relation Age of Onset   Heart failure Father    Heart attack Father    Diabetes Father    Heart attack Mother    Heart failure Mother    Diabetes Mother    Stroke Sister    Past Surgical History:  Procedure Laterality Date   ABDOMINAL AORTOGRAM W/LOWER EXTREMITY Right 10/19/2020   Procedure: ABDOMINAL AORTOGRAM W/LOWER EXTREMITY;  Surgeon: Wellington Hampshire, MD;  Location: [facility];  Service: Cardiovascular;  Laterality: Right;   FRACTURE SURGERY     Social History   Occupational History    Employer: RETIRED  Tobacco Use   Smoking status: Never   Smokeless tobacco: Never  Vaping Use   Vaping Use: Never used  Substance and Sexual Activity   Alcohol use: No    Alcohol/week: 0.0 standard drinks   Drug use: No   Sexual activity: Yes    Birth control/protection: Post-menopausal

## 2020-12-22 DIAGNOSIS — I130 Hypertensive heart and chronic kidney disease with heart failure and stage 1 through stage 4 chronic kidney disease, or unspecified chronic kidney disease: Secondary | ICD-10-CM | POA: Diagnosis not present

## 2020-12-22 DIAGNOSIS — I129 Hypertensive chronic kidney disease with stage 1 through stage 4 chronic kidney disease, or unspecified chronic kidney disease: Secondary | ICD-10-CM | POA: Diagnosis not present

## 2020-12-22 DIAGNOSIS — M79671 Pain in right foot: Secondary | ICD-10-CM | POA: Diagnosis not present

## 2020-12-22 DIAGNOSIS — I700 Atherosclerosis of aorta: Secondary | ICD-10-CM | POA: Diagnosis not present

## 2020-12-22 DIAGNOSIS — M069 Rheumatoid arthritis, unspecified: Secondary | ICD-10-CM | POA: Diagnosis not present

## 2020-12-22 DIAGNOSIS — I480 Paroxysmal atrial fibrillation: Secondary | ICD-10-CM | POA: Diagnosis not present

## 2020-12-22 DIAGNOSIS — M5416 Radiculopathy, lumbar region: Secondary | ICD-10-CM | POA: Diagnosis not present

## 2020-12-22 DIAGNOSIS — E1140 Type 2 diabetes mellitus with diabetic neuropathy, unspecified: Secondary | ICD-10-CM | POA: Diagnosis not present

## 2020-12-22 DIAGNOSIS — D649 Anemia, unspecified: Secondary | ICD-10-CM | POA: Diagnosis not present

## 2020-12-22 DIAGNOSIS — N1832 Chronic kidney disease, stage 3b: Secondary | ICD-10-CM | POA: Diagnosis not present

## 2020-12-22 DIAGNOSIS — M810 Age-related osteoporosis without current pathological fracture: Secondary | ICD-10-CM | POA: Diagnosis not present

## 2020-12-22 DIAGNOSIS — Z794 Long term (current) use of insulin: Secondary | ICD-10-CM | POA: Diagnosis not present

## 2020-12-23 DIAGNOSIS — M069 Rheumatoid arthritis, unspecified: Secondary | ICD-10-CM | POA: Diagnosis not present

## 2020-12-28 ENCOUNTER — Other Ambulatory Visit: Payer: Self-pay

## 2020-12-28 ENCOUNTER — Encounter: Payer: Self-pay | Admitting: Physical Medicine and Rehabilitation

## 2020-12-28 ENCOUNTER — Ambulatory Visit (INDEPENDENT_AMBULATORY_CARE_PROVIDER_SITE_OTHER): Payer: Medicare Other | Admitting: Physical Medicine and Rehabilitation

## 2020-12-28 VITALS — BP 136/75 | HR 87

## 2020-12-28 DIAGNOSIS — M5416 Radiculopathy, lumbar region: Secondary | ICD-10-CM | POA: Diagnosis not present

## 2020-12-28 DIAGNOSIS — M4316 Spondylolisthesis, lumbar region: Secondary | ICD-10-CM | POA: Diagnosis not present

## 2020-12-28 DIAGNOSIS — M48062 Spinal stenosis, lumbar region with neurogenic claudication: Secondary | ICD-10-CM

## 2020-12-28 DIAGNOSIS — M47816 Spondylosis without myelopathy or radiculopathy, lumbar region: Secondary | ICD-10-CM

## 2020-12-28 DIAGNOSIS — I6523 Occlusion and stenosis of bilateral carotid arteries: Secondary | ICD-10-CM

## 2020-12-28 DIAGNOSIS — M4726 Other spondylosis with radiculopathy, lumbar region: Secondary | ICD-10-CM | POA: Diagnosis not present

## 2020-12-28 NOTE — Progress Notes (Signed)
Shelby Walker - 87 y.o. female MRN [id number]  Date of birth: 03/26/1933  Office Visit Note: Visit Date: 12/28/2020 PCP: Reynold Bowen, MD Referred by: Reynold Bowen, MD  Subjective: Chief Complaint  Patient presents with   Lower Back - Pain   Left Leg - Pain   Left Foot - Pain   HPI: Shelby Walker is a 87 y.o. female who comes in today for evaluation of chronic, worsening and severe left sided lower back pain radiating down lateral leg to knee. Patient reports pain has been ongoing for several months. Patient states pain is exacerbated by walking and prolonged standing, describes as soreness and sharp in nature, currently rates as 8 out of 10. Patient reports some pain relief with rest, heating pad and medications. Patient had left L4 transforaminal epidural steroid injection on 12/08/2020, which she reports gave her less than 50% relief for 2 days. Patient states no improvement in functional ability after injection. Fluoroscopic images of the injection seemed well placed. Patient's recent lumbar MRI exhibits multi-level facet arthropathy, stairstep listhesis grade 1 of L3 on L4, L4 on L5 and L5 on S1, moderate-severe spinal stenosis and left paracentral disc protrusion at L3-L4 and severe spinal stenosis at L4-L5. Patient states she was diagnosed with c diff several months ago and was hospitalized, she reports she has not felt well since and states that her back pain issues did become worse while she was in the hospital. Patient has been treated by Dr. Jean Rosenthal for chronic left knee osteoarthritis. She has had several left knee cortisone injections and has recently undergone viscosupplementation and reports good relief of pain at this time. Patient currently uses cane to assist with balance and ambulation. Patient does live at home alone, however her daughter does check on her everyday and helps to care for her. Patient denies focal weakness, numbness and tingling. Patient denies recent  traumas or falls.   Incidentally, patient did mention chronic issues with right foot wound. Daughter reports she struck right foot on furniture several days ago and reports increased pain.   Patient's course is complicated by CAD, atrial fibrillation, rheumatoid arthritis and diabetes mellitus. Patient is currently on long term anticoagulation therapy, taking Eliquis.   Review of Systems  Musculoskeletal:  Positive for back pain.  Neurological:  Negative for tingling, sensory change, focal weakness and weakness.  All other systems reviewed and are negative. Otherwise per HPI.  Assessment & Plan: Visit Diagnoses:    ICD-10-CM   1. Lumbar radiculopathy  M54.16 Ambulatory referral to Physical Medicine Rehab    2. Spinal stenosis of lumbar region with neurogenic claudication  M48.062     3. Other spondylosis with radiculopathy, lumbar region  M47.26     4. Spondylolisthesis of lumbar region  M43.16     5. Facet arthropathy, lumbar  M47.816        Plan: Findings:  Chronic, worsening and severe left sided lower back pain radiating to lateral leg and down to knee. Patient continues to have excruciating pain despite good conservative therapies such as rest, heating pad and use of medications. Patient's clinical presentation and exam are consistent with classic L4 nerve pattern. There is L3-4 disc herniation but multilevel stenosis. We believe the next step would be to perform a diagnostic and hopefully therapeutic left L3 transforaminal epidural steroid injection under fluoroscopic guidance. If patient does get good relief with injection we will continue to monitor, however if her pain persists we would consider interlaminar epidural steroid  injection. We are not confident that patient would be a good surgical candidate, however we did speak to patient and daughter about possible surgical consultation. Patient encouraged to continue being active as tolerated. Patient instructed to continue using  cane. No red flag symptoms noted upon exam.   Patient and daughter instructed to follow-up with Dr. Lorenda Peck with podiatry as needed.      Meds & Orders: No orders of the defined types were placed in this encounter.   Orders Placed This Encounter  Procedures   Ambulatory referral to Physical Medicine Rehab     Follow-up: Return in about 1 week (around 01/04/2021) for Left L3 transforaminal epidural steroid injection.   Procedures: No procedures performed      Clinical History: MRI LUMBAR SPINE WITHOUT CONTRAST   TECHNIQUE: Multiplanar, multisequence MR imaging of the lumbar spine was performed. No intravenous contrast was administered.   COMPARISON:  RADIOGRAPHY OF THE LUMBAR DATED 11/16/2020. RADIOGRAPHY OF THE LUMBAR DATED 08/07/2018.   FINDINGS: Segmentation:  Standard.   Alignment: Grade 1 anterolisthesis of L3 on L4, L4 on L5 and L5-S1 secondary to facet disease.   Vertebrae: No acute fracture, evidence of discitis, or aggressive bone lesion.   Conus medullaris and cauda equina: Conus extends to the L1-2 level. Conus and cauda equina appear normal.   Paraspinal and other soft tissues: No acute paraspinal abnormality. Right renal cyst.   Disc levels:   Disc spaces: Degenerative disease with disc height loss at L3-4, L4-5 and L5-S1.   T12-L1: Minimal broad-based disc bulge. No foraminal or central canal stenosis.   L1-L2: No significant disc bulge. No neural foraminal stenosis. No central canal stenosis.   L2-L3: Minimal broad-based disc bulge. Mild bilateral facet arthropathy. No foraminal or central canal stenosis.   L3-L4: Broad-based disc bulge with a broad left paracentral disc protrusion with mass effect on the left intraspinal L4 nerve root. Moderate bilateral facet arthropathy. Moderate-severe spinal stenosis. Left subarticular recess stenosis. Moderate left foraminal stenosis. Mild right foraminal stenosis.   L4-L5: Broad-based disc bulge  with a broad central disc protrusion. Severe bilateral facet arthropathy with ligamentum flavum infolding. Severe spinal stenosis. Mild right and severe left foraminal stenosis.   L5-S1: Mild broad-based disc bulge with a broad shallow right paracentral disc protrusion with mass effect on the right intraspinal S1 nerve root. Mild bilateral foraminal stenosis. Mild bilateral facet arthropathy. No spinal stenosis.   IMPRESSION: 1. Lumbar spine spondylosis as described above most severe at L3-4, L4-5 and L5-S1. 2. No acute osseous injury of the lumbar spine.     Electronically Signed   By: Kathreen Devoid M.D.   On: 11/23/2020 08:11   She reports that she has never smoked. She has never used smokeless tobacco. No results for input(s): HGBA1C, LABURIC in the last 8760 hours.  Objective:  VS:  HT:    WT:   BMI:     BP:136/75  HR:87bpm  TEMP: ( )  RESP:  Physical Exam Vitals and nursing note reviewed.  HENT:     Head: Normocephalic and atraumatic.     Right Ear: External ear normal.     Left Ear: External ear normal.     Nose: Nose normal.     Mouth/Throat:     Mouth: Mucous membranes are moist.  Eyes:     Extraocular Movements: Extraocular movements intact.  Cardiovascular:     Rate and Rhythm: Normal rate.     Pulses: Normal pulses.  Pulmonary:  Effort: Pulmonary effort is normal.  Abdominal:     General: Abdomen is flat. There is no distension.  Musculoskeletal:        General: Tenderness present.     Cervical back: Normal range of motion.     Comments: Pt is slow to rise from seated position to standing. Good lumbar range of motion. Strong distal strength without clonus, no pain upon palpation of greater trochanters. Sensation intact bilaterally. Dysesthesias noted to left L4 dermatome. Ambulates with cane, gait unsteady.  Skin:    General: Skin is warm and dry.     Capillary Refill: Capillary refill takes less than 2 seconds.  Neurological:     Mental Status: She  is alert.     Gait: Gait abnormal.  Psychiatric:        Mood and Affect: Mood normal.    Ortho Exam  Imaging: No results found.  Past Medical/Family/Surgical/Social History: Medications & Allergies reviewed per EMR, new medications updated. Patient Active Problem List   Diagnosis Date Noted   Decreased anal sphincter tone 12/13/2018   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   Irritable bowel syndrome with diarrhea - post infectious 12/12/2018   Full incontinence of feces and fecal smearing 12/12/2018   Perianal dermatitis 12/12/2018   Uncontrolled type 2 diabetes mellitus with hyperglycemia ([place]) 10/26/2018   Long-term use of immunosuppressant medication-Enbrel 10/26/2018   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 08/27/2018   Thrombocytopenia ([place])    Essential hypertension    Dyspnea on exertion    Steroid-induced hyperglycemia    Fatigue 08/06/2018   Paroxysmal atrial fibrillation ([place])    Mixed hyperlipidemia 09/29/2016   Stable angina ([place]) 09/29/2016   PAD (peripheral artery disease) ([place]) 08/09/2016   Preoperative clearance 05/21/2012   Precordial chest pain    Palpitations    Diabetes mellitus ([facility])    GERD (gastroesophageal reflux disease)    Ejection fraction    Carotid artery disease ([place])    Mitral regurgitation 04/21/2009   PSORIASIS 02/09/2009   Rheumatoid arthritis flare ([place]) 02/09/2009   Past Medical History:  Diagnosis Date   Bruit    Carotid Doppler showed no significant abnormality     9per patient)   C. difficile colitis 07/2018   with severe sepsis   Chest pain, unspecified    Nuclear, May, 2008, no scar or ischemia   Diabetes mellitus    Diverticulosis    Dyslipidemia    Ejection fraction    EF 55-60%, echo, February, 2011 // Echocardiogram 8/21: EF 60-65, no RWMA, Gr 1 DD, GLS -14%, normal RVSF, mild LAE, trivial MR, mild MS (mean 4 mmHg), RVSP 23.4    GERD (gastroesophageal reflux disease)    Mitral regurgitation April 21, 2009    mild,  echo, February, 2011   Osteoporosis    Palpitations    possible very brief atrial fibrillation on monitor and possible reentrant tachycardia   Psoriasis    Rheumatoid arthritis ([place])    Family History  Problem Relation Age of Onset   Heart failure Father    Heart attack Father    Diabetes Father    Heart attack Mother    Heart failure Mother    Diabetes Mother    Stroke Sister    Past Surgical History:  Procedure Laterality Date   ABDOMINAL AORTOGRAM W/LOWER EXTREMITY Right 10/19/2020   Procedure: ABDOMINAL AORTOGRAM W/LOWER EXTREMITY;  Surgeon: Wellington Hampshire, MD;  Location: [facility];  Service: Cardiovascular;  Laterality: Right;   FRACTURE SURGERY  Social History   Occupational History    Employer: RETIRED  Tobacco Use   Smoking status: Never   Smokeless tobacco: Never  Vaping Use   Vaping Use: Never used  Substance and Sexual Activity   Alcohol use: No    Alcohol/week: 0.0 standard drinks   Drug use: No   Sexual activity: Yes    Birth control/protection: Post-menopausal

## 2020-12-28 NOTE — Progress Notes (Signed)
Pt state lower back pain that travels to her left buttocks and down her leg to the foot. Pt state walking, standing and bending makes the pain worse. Pt state she takes pain meds to help ease her pain.  Numeric Pain Rating Scale and Functional Assessment Average Pain 10 Pain Right Now 7 My pain is intermittent, sharp, burning, stabbing, and aching Pain is worse with: walking, bending, standing, and some activites Pain improves with: medication   In the last MONTH (on 0-10 scale) has pain interfered with the following?  1. General activity like being  able to carry out your everyday physical activities such as walking, climbing stairs, carrying groceries, or moving a chair?  Rating(7)  2. Relation with others like being able to carry out your usual social activities and roles such as  activities at home, at work and in your community. Rating(8)  3. Enjoyment of life such that you have  been bothered by emotional problems such as feeling anxious, depressed or irritable?  Rating(9)

## 2021-01-02 DIAGNOSIS — I480 Paroxysmal atrial fibrillation: Secondary | ICD-10-CM | POA: Diagnosis not present

## 2021-01-02 DIAGNOSIS — N1832 Chronic kidney disease, stage 3b: Secondary | ICD-10-CM | POA: Diagnosis not present

## 2021-01-02 DIAGNOSIS — I10 Essential (primary) hypertension: Secondary | ICD-10-CM | POA: Diagnosis not present

## 2021-01-02 DIAGNOSIS — E1140 Type 2 diabetes mellitus with diabetic neuropathy, unspecified: Secondary | ICD-10-CM | POA: Diagnosis not present

## 2021-01-09 ENCOUNTER — Other Ambulatory Visit: Payer: Self-pay

## 2021-01-09 ENCOUNTER — Ambulatory Visit (INDEPENDENT_AMBULATORY_CARE_PROVIDER_SITE_OTHER): Payer: Medicare Other | Admitting: Podiatry

## 2021-01-09 ENCOUNTER — Ambulatory Visit (INDEPENDENT_AMBULATORY_CARE_PROVIDER_SITE_OTHER): Payer: Medicare Other

## 2021-01-09 ENCOUNTER — Encounter: Payer: Self-pay | Admitting: Physical Medicine and Rehabilitation

## 2021-01-09 ENCOUNTER — Ambulatory Visit: Payer: Self-pay

## 2021-01-09 ENCOUNTER — Ambulatory Visit (INDEPENDENT_AMBULATORY_CARE_PROVIDER_SITE_OTHER): Payer: Medicare Other | Admitting: Physical Medicine and Rehabilitation

## 2021-01-09 VITALS — BP 110/61 | HR 84

## 2021-01-09 DIAGNOSIS — M5416 Radiculopathy, lumbar region: Secondary | ICD-10-CM

## 2021-01-09 DIAGNOSIS — R600 Localized edema: Secondary | ICD-10-CM | POA: Diagnosis not present

## 2021-01-09 MED ORDER — MELOXICAM 7.5 MG PO TABS: 7.5000 mg | ORAL_TABLET | Freq: Every day | ORAL | 0 refills | Status: DC

## 2021-01-09 MED ORDER — METHYLPREDNISOLONE ACETATE 80 MG/ML IJ SUSP
80.0000 mg | Freq: Once | INTRAMUSCULAR | Status: AC
  Administered 2021-01-09: 80 mg

## 2021-01-09 NOTE — Progress Notes (Signed)
Shelby Walker - 87 y.o. female MRN [id number]  Date of birth: October 17, 1933  Office Visit Note: Visit Date: 01/09/2021 PCP: Reynold Bowen, MD Referred by: Reynold Bowen, MD  Subjective: Chief Complaint  Patient presents with   Lower Back - Pain   Left Leg - Pain   Left Foot - Pain   HPI:  Shelby Walker is a 87 y.o. female who comes in today at the request of Shelby Pall, FNP for planned Left L3-4 Lumbar Transforaminal epidural steroid injection with fluoroscopic guidance.  The patient has failed conservative care including home exercise, medications, time and activity modification.  This injection will be diagnostic and hopefully therapeutic.  Please see requesting physician notes for further details and justification.   ROS Otherwise per HPI.  Assessment & Plan: Visit Diagnoses:    ICD-10-CM   1. Lumbar radiculopathy  M54.16 XR C-ARM NO REPORT    Epidural Steroid injection    methylPREDNISolone acetate (DEPO-MEDROL) injection 80 mg      Plan: No additional findings.   Meds & Orders:  Meds ordered this encounter  Medications   methylPREDNISolone acetate (DEPO-MEDROL) injection 80 mg    Orders Placed This Encounter  Procedures   XR C-ARM NO REPORT   Epidural Steroid injection    Follow-up: Return if symptoms worsen or fail to improve.   Procedures: No procedures performed  Lumbosacral Transforaminal Epidural Steroid Injection - Sub-Pedicular Approach with Fluoroscopic Guidance  Patient: Shelby Walker      Date of Birth: 06-08-1933 MRN: [id number] PCP: Reynold Bowen, MD      Visit Date: 01/09/2021   Universal Protocol:    Date/Time: 01/09/2021  Consent Given By: the patient  Position: PRONE  Additional Comments: Vital signs were monitored before and after the procedure. Patient was prepped and draped in the usual sterile fashion. The correct patient, procedure, and site was verified.   Injection Procedure Details:   Procedure diagnoses: Lumbar radiculopathy  [M54.16]    Meds Administered:  Meds ordered this encounter  Medications   methylPREDNISolone acetate (DEPO-MEDROL) injection 80 mg    Laterality: Left  Location/Site: L3  Needle:5.0 in., 22 ga.  Short bevel or Quincke spinal needle  Needle Placement: Transforaminal  Findings:    -Comments: Excellent flow of contrast along the nerve, nerve root and into the epidural space.  Procedure Details: After squaring off the end-plates to get a true AP view, the C-arm was positioned so that an oblique view of the foramen as noted above was visualized. The target area is just inferior to the "nose of the scotty dog" or sub pedicular. The soft tissues overlying this structure were infiltrated with 2-3 ml. of 1% Lidocaine without Epinephrine.  The spinal needle was inserted toward the target using a "trajectory" view along the fluoroscope beam.  Under AP and lateral visualization, the needle was advanced so it did not puncture dura and was located close the 6 O'Clock position of the pedical in AP tracterory. Biplanar projections were used to confirm position. Aspiration was confirmed to be negative for CSF and/or blood. A 1-2 ml. volume of Isovue-250 was injected and flow of contrast was noted at each level. Radiographs were obtained for documentation purposes.   After attaining the desired flow of contrast documented above, a 0.5 to 1.0 ml test dose of 0.25% Marcaine was injected into each respective transforaminal space.  The patient was observed for 90 seconds post injection.  After no sensory deficits were reported, and normal lower extremity  motor function was noted,   the above injectate was administered so that equal amounts of the injectate were placed at each foramen (level) into the transforaminal epidural space.   Additional Comments:  The patient tolerated the procedure well Dressing: 2 x 2 sterile gauze and Band-Aid    Post-procedure details: Patient was observed during the  procedure. Post-procedure instructions were reviewed.  Patient left the clinic in stable condition.     Clinical History: MRI LUMBAR SPINE WITHOUT CONTRAST   TECHNIQUE: Multiplanar, multisequence MR imaging of the lumbar spine was performed. No intravenous contrast was administered.   COMPARISON:  RADIOGRAPHY OF THE LUMBAR DATED 11/16/2020. RADIOGRAPHY OF THE LUMBAR DATED 08/07/2018.   FINDINGS: Segmentation:  Standard.   Alignment: Grade 1 anterolisthesis of L3 on L4, L4 on L5 and L5-S1 secondary to facet disease.   Vertebrae: No acute fracture, evidence of discitis, or aggressive bone lesion.   Conus medullaris and cauda equina: Conus extends to the L1-2 level. Conus and cauda equina appear normal.   Paraspinal and other soft tissues: No acute paraspinal abnormality. Right renal cyst.   Disc levels:   Disc spaces: Degenerative disease with disc height loss at L3-4, L4-5 and L5-S1.   T12-L1: Minimal broad-based disc bulge. No foraminal or central canal stenosis.   L1-L2: No significant disc bulge. No neural foraminal stenosis. No central canal stenosis.   L2-L3: Minimal broad-based disc bulge. Mild bilateral facet arthropathy. No foraminal or central canal stenosis.   L3-L4: Broad-based disc bulge with a broad left paracentral disc protrusion with mass effect on the left intraspinal L4 nerve root. Moderate bilateral facet arthropathy. Moderate-severe spinal stenosis. Left subarticular recess stenosis. Moderate left foraminal stenosis. Mild right foraminal stenosis.   L4-L5: Broad-based disc bulge with a broad central disc protrusion. Severe bilateral facet arthropathy with ligamentum flavum infolding. Severe spinal stenosis. Mild right and severe left foraminal stenosis.   L5-S1: Mild broad-based disc bulge with a broad shallow right paracentral disc protrusion with mass effect on the right intraspinal S1 nerve root. Mild bilateral foraminal stenosis.  Mild bilateral facet arthropathy. No spinal stenosis.   IMPRESSION: 1. Lumbar spine spondylosis as described above most severe at L3-4, L4-5 and L5-S1. 2. No acute osseous injury of the lumbar spine.     Electronically Signed   By: Kathreen Devoid M.D.   On: 11/23/2020 08:11     Objective:  VS:  HT:    WT:   BMI:     BP:110/61  HR:84bpm  TEMP: ( )  RESP:  Physical Exam Vitals and nursing note reviewed.  Constitutional:      General: She is not in acute distress.    Appearance: Normal appearance. She is not ill-appearing.  HENT:     Head: Normocephalic and atraumatic.     Right Ear: External ear normal.     Left Ear: External ear normal.  Eyes:     Extraocular Movements: Extraocular movements intact.  Cardiovascular:     Rate and Rhythm: Normal rate.     Pulses: Normal pulses.  Pulmonary:     Effort: Pulmonary effort is normal. No respiratory distress.  Abdominal:     General: There is no distension.     Palpations: Abdomen is soft.  Musculoskeletal:        General: Tenderness present.     Cervical back: Neck supple.     Right lower leg: No edema.     Left lower leg: No edema.     Comments: Patient  has good distal strength with no pain over the greater trochanters.  No clonus or focal weakness.  Skin:    Findings: No erythema, lesion or rash.  Neurological:     General: No focal deficit present.     Mental Status: She is alert and oriented to person, place, and time.     Sensory: No sensory deficit.     Motor: No weakness or abnormal muscle tone.     Coordination: Coordination normal.  Psychiatric:        Mood and Affect: Mood normal.        Behavior: Behavior normal.     Imaging: No results found.

## 2021-01-09 NOTE — Progress Notes (Signed)
Pt state lower back pain that travels to her left buttocks and down her leg to the foot. Pt state walking, standing and bending makes the pain worse. Pt state she takes pain meds to help ease her pain.  Numeric Pain Rating Scale and Functional Assessment Average Pain 8   In the last MONTH (on 0-10 scale) has pain interfered with the following?  1. General activity like being  able to carry out your everyday physical activities such as walking, climbing stairs, carrying groceries, or moving a chair?  Rating(10)   +Driver, +BT, -Dye Allergies.

## 2021-01-09 NOTE — Patient Instructions (Signed)

## 2021-01-09 NOTE — Progress Notes (Signed)
   HPI: 87 y.o. female presenting today for an acute flareup of swelling to the right foot with redness.  Patient states that she woke up this morning with a very red swollen foot.  She denies a history of injury.  She says that she gets a significant amount of swelling and normally she wears compression socks but today she was unable to wear them.  She presents for further treatment and evaluation  Past Medical History:  Diagnosis Date   Bruit    Carotid Doppler showed no significant abnormality     9per patient)   C. difficile colitis 07/2018   with severe sepsis   Chest pain, unspecified    Nuclear, May, 2008, no scar or ischemia   Diabetes mellitus    Diverticulosis    Dyslipidemia    Ejection fraction    EF 55-60%, echo, February, 2011 // Echocardiogram 8/21: EF 60-65, no RWMA, Gr 1 DD, GLS -14%, normal RVSF, mild LAE, trivial MR, mild MS (mean 4 mmHg), RVSP 23.4    GERD (gastroesophageal reflux disease)    Mitral regurgitation April 21, 2009   mild,  echo, February, 2011   Osteoporosis    Palpitations    possible very brief atrial fibrillation on monitor and possible reentrant tachycardia   Psoriasis    Rheumatoid arthritis ([place])      Physical Exam: General: The patient is alert and oriented x3 in no acute distress.  Dermatology: Skin is warm, dry and supple bilateral lower extremities. Negative for open lesions or macerations.  Vascular: Palpable pedal pulses bilaterally.  Moderate erythema with some edema also noted to the right foot.  This is localized diffusely throughout the foot.  This does not extend more proximal than the ankle  Neurological: Epicritic and protective threshold grossly intact bilaterally.   Musculoskeletal Exam: Range of motion within normal limits to all pedal and ankle joints bilateral. Muscle strength 5/5 in all groups bilateral.  Diffuse pain on palpation throughout the right foot  Radiographic Exam:  Diffuse degenerative changes and  arthropathies noted throughout all the pedal joints of the foot.  Osseous erosions noted.  No acute findings that would be concerning for an osteomyelitis.  No fractures identified.  Diffuse osseous demineralization consistent with findings of osteoporosis  Assessment: 1.  Edema with erythema right foot possibly secondary to acute gout   Plan of Care:  1. Patient evaluated. X-Rays reviewed.  2.  The patient may be having an acute gout flareup.  Recommend compression daily.  Patient wears her compression socks daily 3.  Compression sleeves dispensed.  Wear daily or nightly 4.  Prescription for meloxicam 7.5 mg daily for short-term anti-inflammatory 5.  Return to clinic 4 weeks      Edrick Kins, DPM [facility]  Dr. Edrick Kins, DPM    [address], [place] [address]                Office [phone]  Fax [phone]

## 2021-01-11 DIAGNOSIS — Z794 Long term (current) use of insulin: Secondary | ICD-10-CM | POA: Diagnosis not present

## 2021-01-11 DIAGNOSIS — I130 Hypertensive heart and chronic kidney disease with heart failure and stage 1 through stage 4 chronic kidney disease, or unspecified chronic kidney disease: Secondary | ICD-10-CM | POA: Diagnosis not present

## 2021-01-11 DIAGNOSIS — E1140 Type 2 diabetes mellitus with diabetic neuropathy, unspecified: Secondary | ICD-10-CM | POA: Diagnosis not present

## 2021-01-11 DIAGNOSIS — I700 Atherosclerosis of aorta: Secondary | ICD-10-CM | POA: Diagnosis not present

## 2021-01-11 DIAGNOSIS — N1832 Chronic kidney disease, stage 3b: Secondary | ICD-10-CM | POA: Diagnosis not present

## 2021-01-11 DIAGNOSIS — Z23 Encounter for immunization: Secondary | ICD-10-CM | POA: Diagnosis not present

## 2021-01-12 IMAGING — CT CT ABDOMEN AND PELVIS WITH CONTRAST
2 of 5 series · 16 of 46 positions shown, 18 images · IV contrast (APPLIED)
Comparison: 07/09/2018

CLINICAL DATA: Fevers abdominal pain

EXAM:
CT ABDOMEN AND PELVIS WITH CONTRAST
TECHNIQUE: Multidetector CT imaging of the abdomen and pelvis was performed
using the standard protocol following bolus administration of
intravenous contrast.
CONTRAST:  100mL OMNIPAQUE IOHEXOL 300 MG/ML  SOLN

[Series 2: axial st · axial · 0.81mm/px · z∈[-508,-98]mm · 13 of 92 slices shown, 15 images]
[im 5/92  soft-tissue]
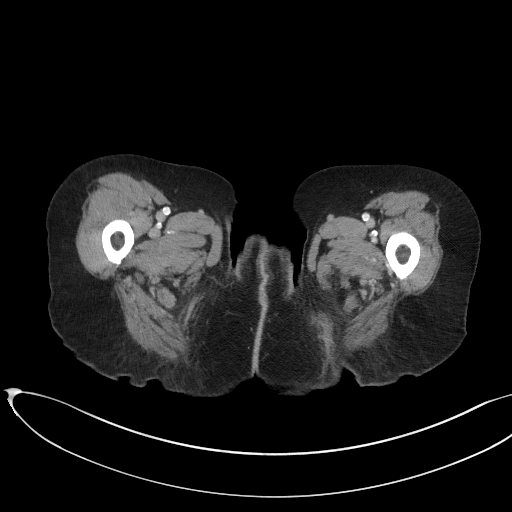
[im 5/92  bone]
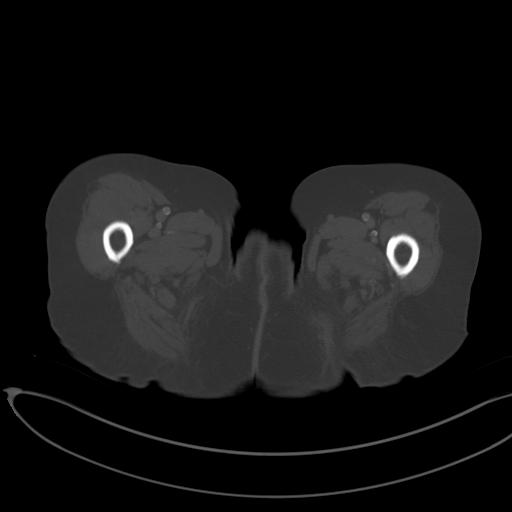
[im 14/92  soft-tissue]
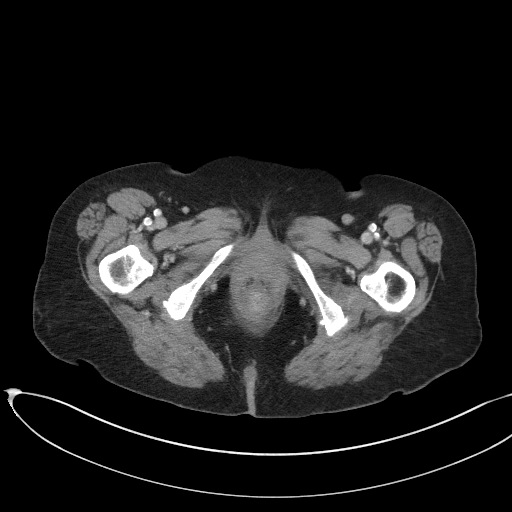
[im 19/92  soft-tissue]
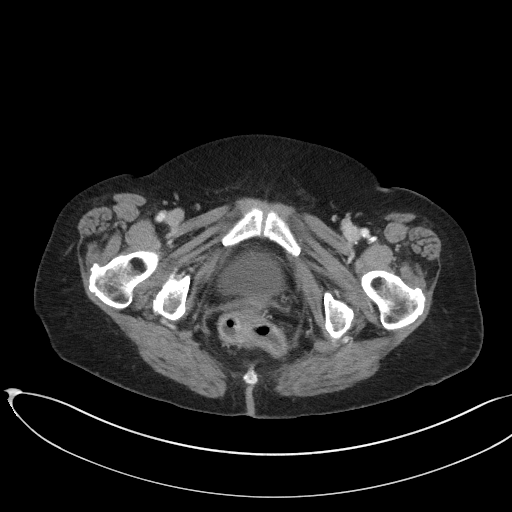
[im 28/92  soft-tissue]
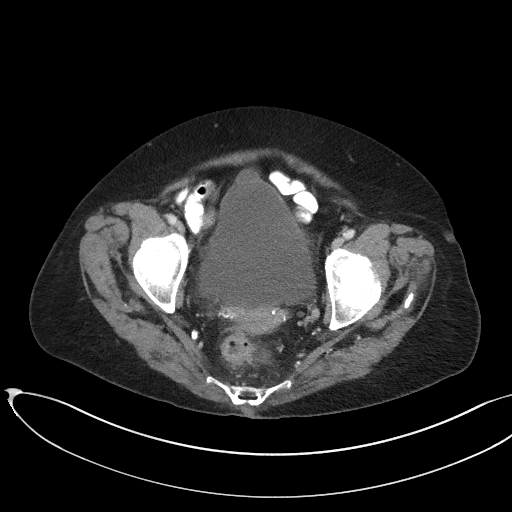
[im 32/92  soft-tissue]
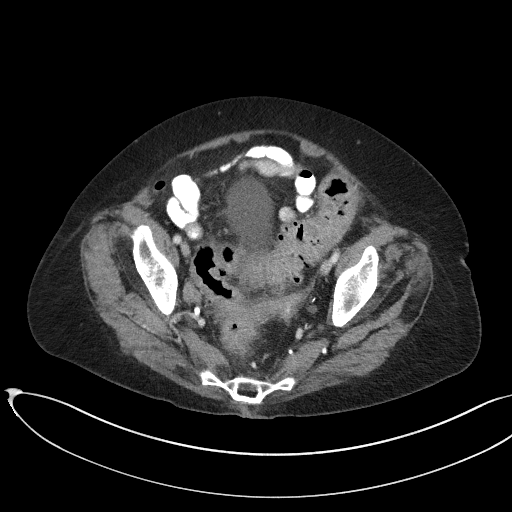
[im 41/92  soft-tissue]
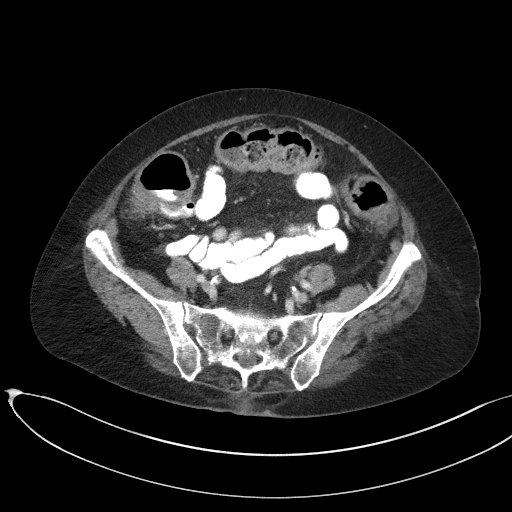
[im 46/92  soft-tissue]
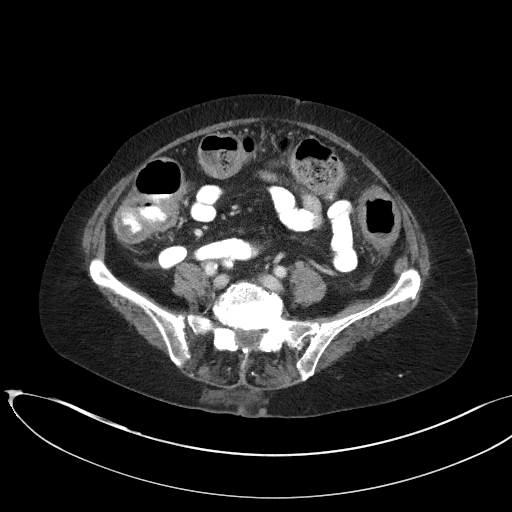
[im 51/92  soft-tissue]
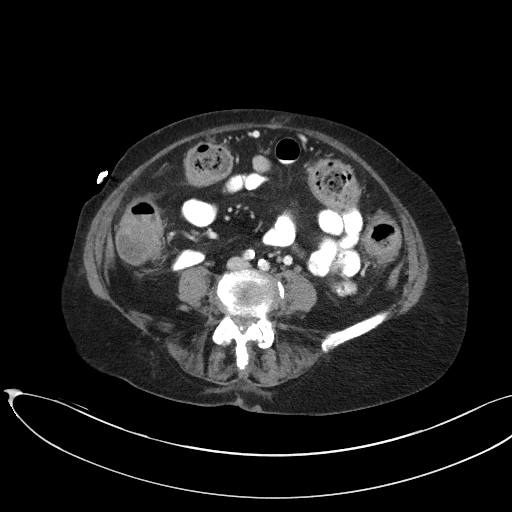
[im 60/92  soft-tissue]
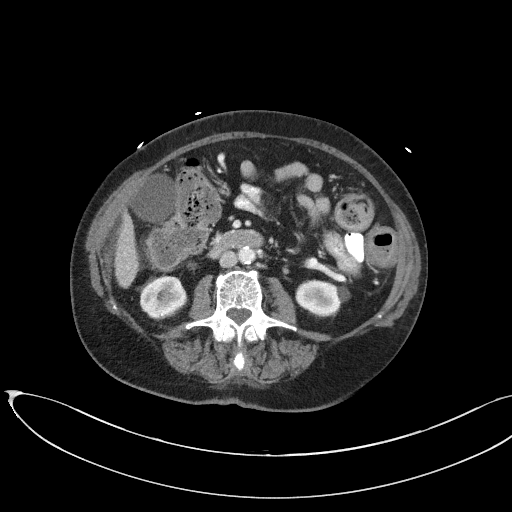
[im 60/92  bone]
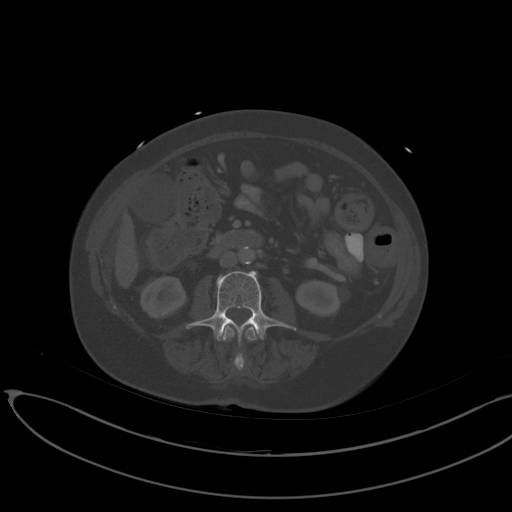
[im 64/92  soft-tissue]
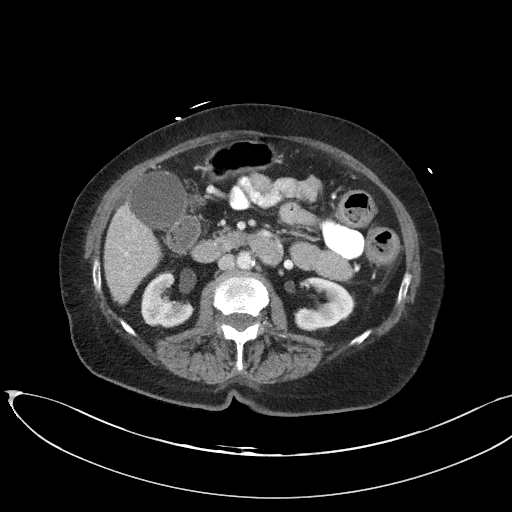
[im 73/92  soft-tissue]
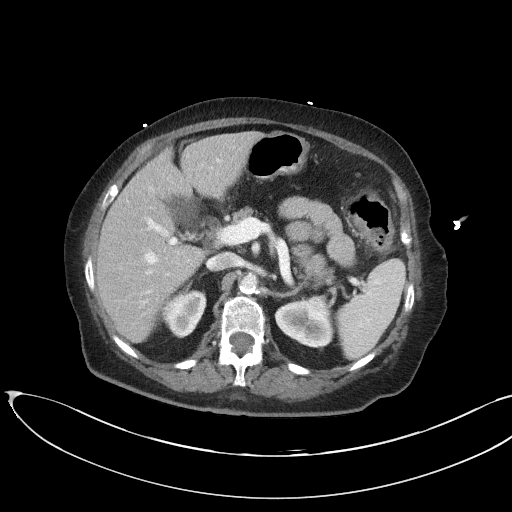
[im 78/92  soft-tissue]
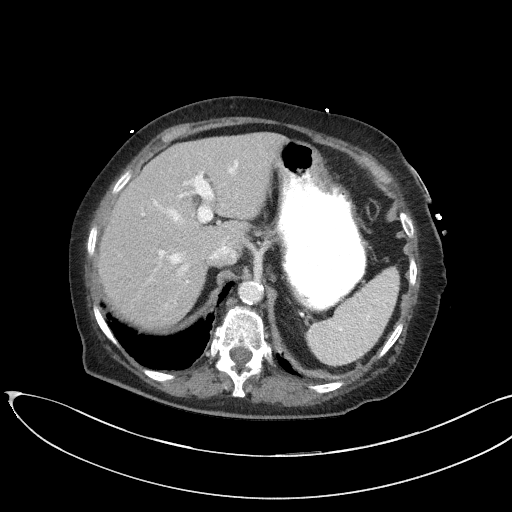
[im 87/92  soft-tissue]
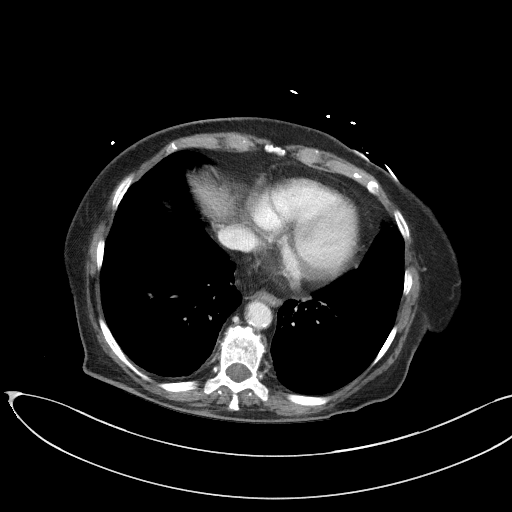

[Series 5: coronal st · coronal · 0.67mm/px · 3 of 101 slices shown]
[im 34/101  soft-tissue]
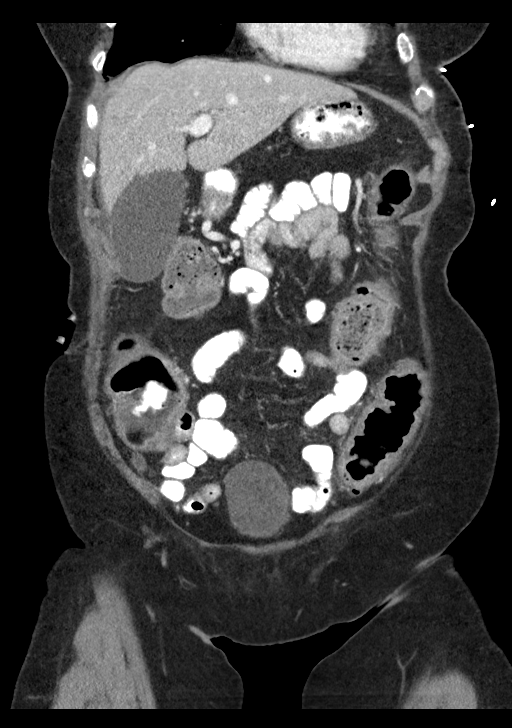
[im 45/101  soft-tissue]
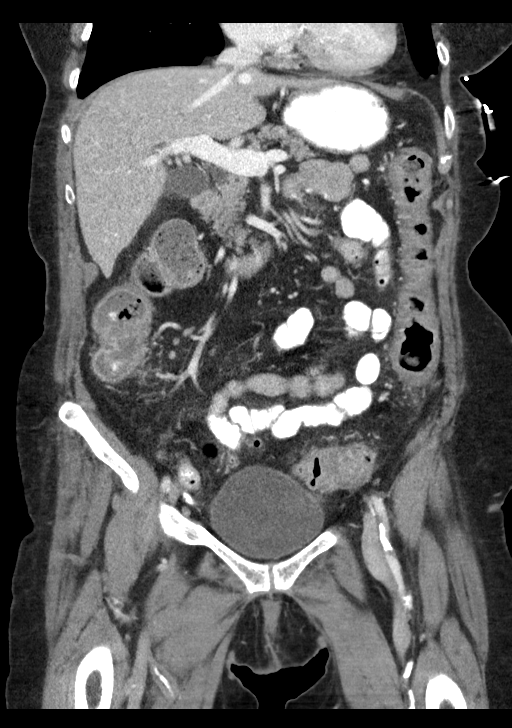
[im 56/101  soft-tissue]
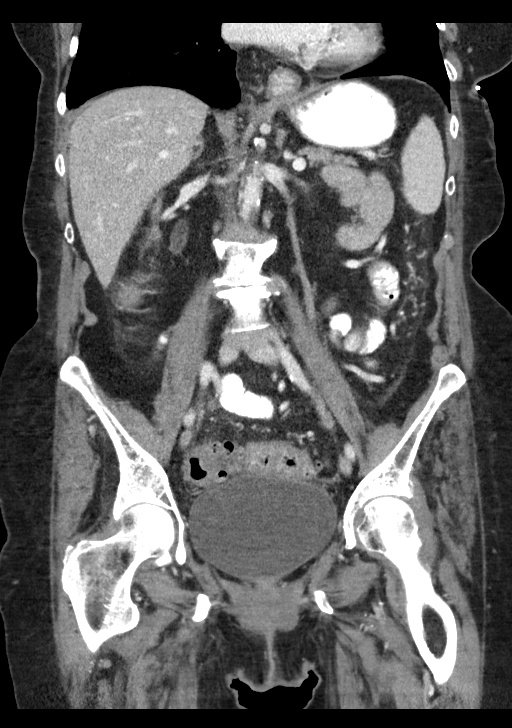

[16 of 46 positions shown; findings below may reference images not displayed]

FINDINGS: Lower chest: No acute abnormality.

Hepatobiliary: No focal liver abnormality is seen. No gallstones,
gallbladder wall thickening, or biliary dilatation.

Pancreas: Unremarkable. No pancreatic ductal dilatation or
surrounding inflammatory changes.

Spleen: Normal in size without focal abnormality.

Adrenals/Urinary Tract: Adrenal glands are within normal limits
bilaterally. Kidneys demonstrate no renal calculi or urinary tract
obstructive changes. Stable renal cysts are noted bilaterally.
Bladder is well distended.

Stomach/Bowel: Colon demonstrates diffuse diverticular change
throughout the sigmoid colon. Mucosal hyperemia and mild pericolonic
inflammatory changes are now seen diffusely throughout the colon
increased from the prior exam consistent with a more generalized
likely infectious colitis. Small bowel and stomach are within normal
limits with the exception of a small sliding-type hiatal hernia. No
free air is seen.

Vascular/Lymphatic: Aortic atherosclerosis. No enlarged abdominal or
pelvic lymph nodes.

Reproductive: Uterus and bilateral adnexa are unremarkable.

Other: No abdominal wall hernia or abnormality. No abdominopelvic
ascites.

Musculoskeletal: Degenerative changes of lumbar spine are noted.
IMPRESSION: Changes consistent with diffuse colitis without perforation or
abscess formation. This has advanced significantly when compared
with the prior exam.

## 2021-01-13 ENCOUNTER — Ambulatory Visit: Payer: Medicare Other | Admitting: Podiatry

## 2021-01-14 ENCOUNTER — Emergency Department (HOSPITAL_BASED_OUTPATIENT_CLINIC_OR_DEPARTMENT_OTHER)
Admission: EM | Admit: 2021-01-14 | Discharge: 2021-01-14 | Disposition: A | Discharge status: Home / Self Care | Payer: Medicare Other | Source: Home / Self Care | Attending: Emergency Medicine | Admitting: Emergency Medicine

## 2021-01-14 ENCOUNTER — Encounter (HOSPITAL_BASED_OUTPATIENT_CLINIC_OR_DEPARTMENT_OTHER): Payer: Self-pay | Admitting: Emergency Medicine

## 2021-01-14 ENCOUNTER — Other Ambulatory Visit: Payer: Self-pay

## 2021-01-14 DIAGNOSIS — E11628 Type 2 diabetes mellitus with other skin complications: Secondary | ICD-10-CM | POA: Diagnosis not present

## 2021-01-14 DIAGNOSIS — L97519 Non-pressure chronic ulcer of other part of right foot with unspecified severity: Secondary | ICD-10-CM | POA: Diagnosis not present

## 2021-01-14 DIAGNOSIS — E1152 Type 2 diabetes mellitus with diabetic peripheral angiopathy with gangrene: Secondary | ICD-10-CM | POA: Diagnosis not present

## 2021-01-14 DIAGNOSIS — E43 Unspecified severe protein-calorie malnutrition: Secondary | ICD-10-CM | POA: Diagnosis not present

## 2021-01-14 DIAGNOSIS — Z66 Do not resuscitate: Secondary | ICD-10-CM | POA: Diagnosis not present

## 2021-01-14 DIAGNOSIS — D84821 Immunodeficiency due to drugs: Secondary | ICD-10-CM | POA: Diagnosis not present

## 2021-01-14 DIAGNOSIS — L97511 Non-pressure chronic ulcer of other part of right foot limited to breakdown of skin: Secondary | ICD-10-CM

## 2021-01-14 DIAGNOSIS — E119 Type 2 diabetes mellitus without complications: Secondary | ICD-10-CM | POA: Insufficient documentation

## 2021-01-14 DIAGNOSIS — I480 Paroxysmal atrial fibrillation: Secondary | ICD-10-CM | POA: Insufficient documentation

## 2021-01-14 DIAGNOSIS — Z20822 Contact with and (suspected) exposure to covid-19: Secondary | ICD-10-CM | POA: Diagnosis not present

## 2021-01-14 DIAGNOSIS — E11621 Type 2 diabetes mellitus with foot ulcer: Secondary | ICD-10-CM | POA: Diagnosis not present

## 2021-01-14 DIAGNOSIS — Z794 Long term (current) use of insulin: Secondary | ICD-10-CM | POA: Insufficient documentation

## 2021-01-14 IMAGING — DX PORTABLE CHEST - 1 VIEW
1 series · 1 of 1 positions shown · non-contrast
Comparison: July 24, 2018

CLINICAL DATA: Hypotension

EXAM:
PORTABLE CHEST 1 VIEW

[chest ap]
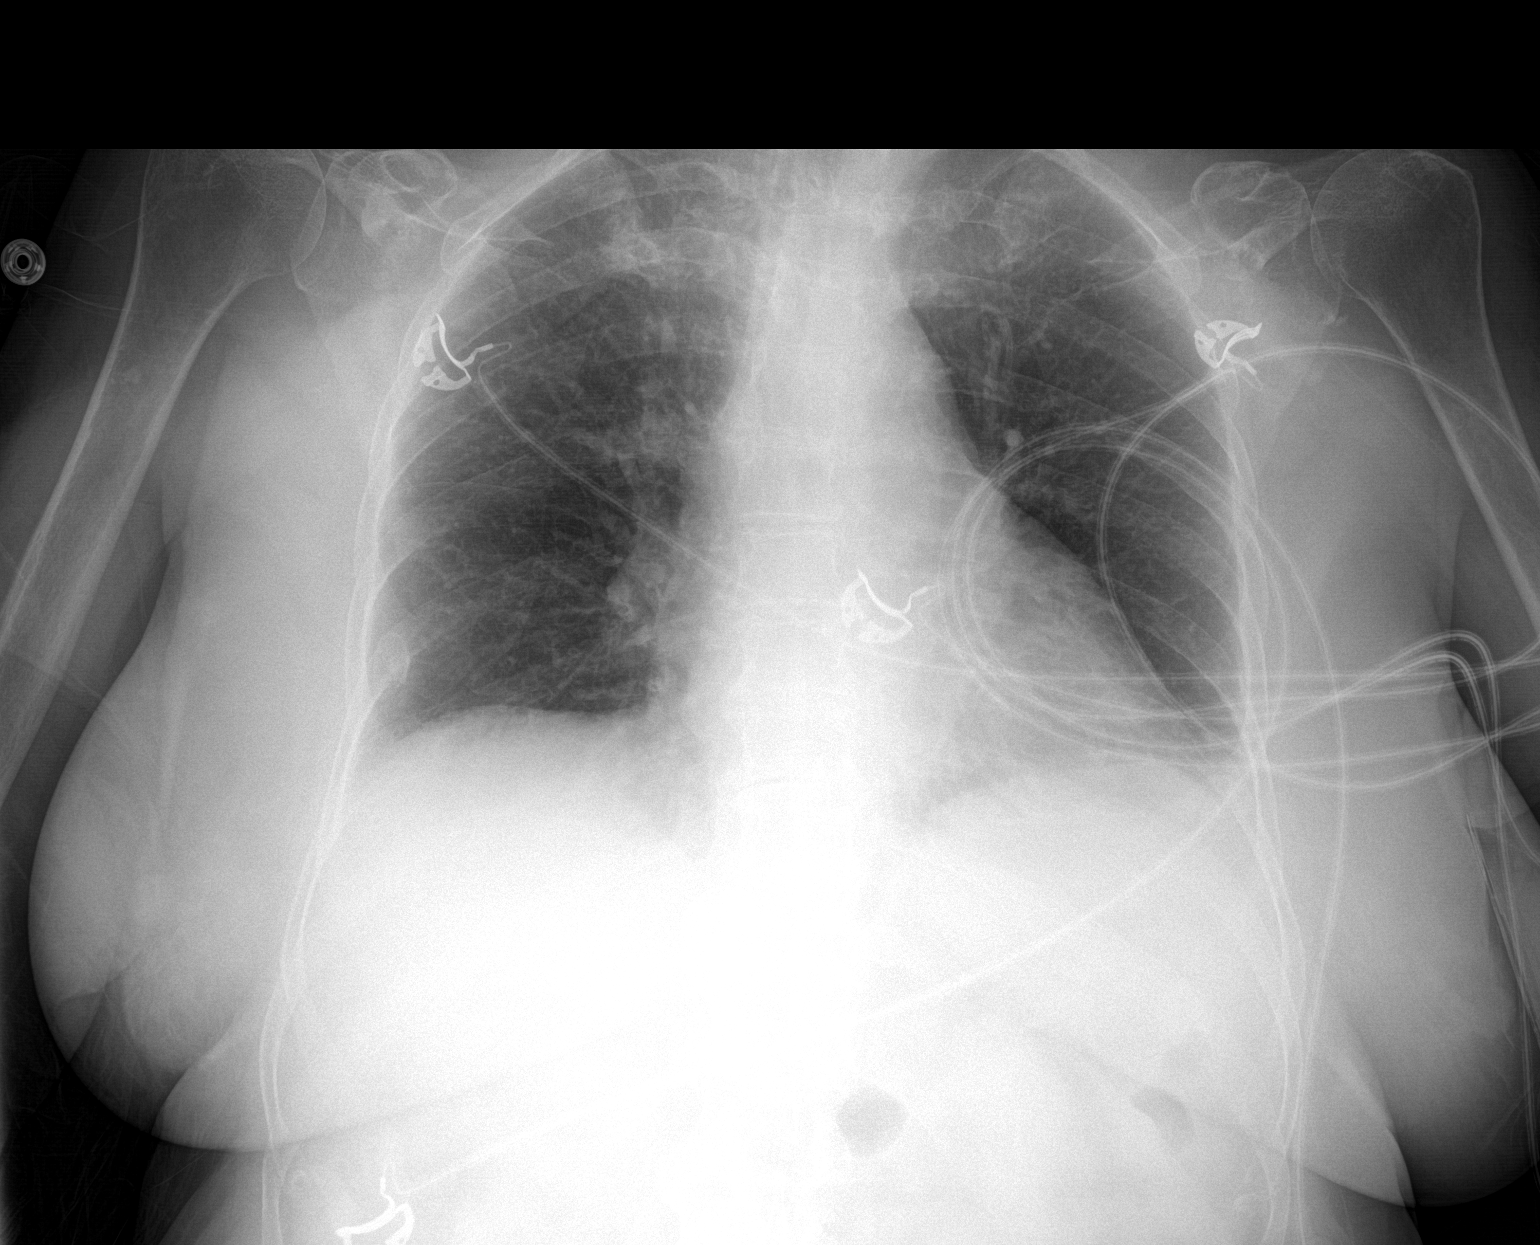

[1 of 1 positions shown; findings below may reference images not displayed]

FINDINGS: No edema or consolidation. Heart size and pulmonary vascularity are
normal. No adenopathy. No bone lesions.
IMPRESSION: No edema or consolidation.  Stable cardiac silhouette.

## 2021-01-14 MED ORDER — DOXYCYCLINE HYCLATE 100 MG PO CAPS: 100.0000 mg | ORAL_CAPSULE | Freq: Two times a day (BID) | ORAL | 0 refills | Status: DC

## 2021-01-14 NOTE — ED Triage Notes (Signed)
Pt presents to ED POV. Pt c/o R foot pain and swelling for over a month. Pt reports the swelling and pain was severe until this morning ir burst on top of R foot and now is black and draining purulent drainage.

## 2021-01-14 NOTE — ED Provider Notes (Signed)
[facility] EMERGENC[facility] DEPT Provider Note   CSN: [id number] Arrival date & time: 01/14/21  1420     History Chief Complaint  Patient presents with   Foot Pain    Shelby Walker is a 87 y.o. female.  Pt's daughter reports pt has had a swollen foot for over a month.  Pt had a swollen area on the top of her foot.  This area openned and began draining yesterday.  Pt has seen Podiatry in the past and is scheduled to see Dr. Doran Durand on Monday. Pt had an xray on 11/7   Pt is diabetic.   The history is provided by the patient. No language interpreter was used.  Foot Pain This is a new problem. The problem occurs constantly. The problem has been gradually worsening. Nothing aggravates the symptoms. Nothing relieves the symptoms. She has tried nothing for the symptoms. The treatment provided no relief.      Past Medical History:  Diagnosis Date   Bruit    Carotid Doppler showed no significant abnormality     9per patient)   C. difficile colitis 07/2018   with severe sepsis   Chest pain, unspecified    Nuclear, May, 2008, no scar or ischemia   Diabetes mellitus    Diverticulosis    Dyslipidemia    Ejection fraction    EF 55-60%, echo, February, 2011 // Echocardiogram 8/21: EF 60-65, no RWMA, Gr 1 DD, GLS -14%, normal RVSF, mild LAE, trivial MR, mild MS (mean 4 mmHg), RVSP 23.4    GERD (gastroesophageal reflux disease)    Mitral regurgitation April 21, 2009   mild,  echo, February, 2011   Osteoporosis    Palpitations    possible very brief atrial fibrillation on monitor and possible reentrant tachycardia   Psoriasis    Rheumatoid arthritis [facility])     Patient Active Problem List   Diagnosis Date Noted   Decreased anal sphincter tone 12/13/2018   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   Irritable bowel syndrome with diarrhea - post infectious 12/12/2018   Full incontinence of feces and fecal smearing 12/12/2018   Perianal dermatitis 12/12/2018   Uncontrolled type 2 diabetes  mellitus with hyperglycemia ([place]) 10/26/2018   Long-term use of immunosuppressant medication-Enbrel 10/26/2018   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 08/27/2018   Thrombocytopenia (HCC)    Essential hypertension    Dyspnea on exertion    Steroid-induced hyperglycemia    Fatigue 08/06/2018   Paroxysmal atrial fibrillation ([place])    Mixed hyperlipidemia 09/29/2016   Stable angina ([place]) 09/29/2016   PAD (peripheral artery disease) ([place]) 08/09/2016   Preoperative clearance 05/21/2012   Precordial chest pain    Palpitations    Diabetes mellitus (HCC)    GERD (gastroesophageal reflux disease)    Ejection fraction    Carotid artery disease ([place])    Mitral regurgitation 04/21/2009   PSORIASIS 02/09/2009   Rheumatoid arthritis flare ([place]) 02/09/2009    Past Surgical History:  Procedure Laterality Date   ABDOMINAL AORTOGRAM W/LOWER EXTREMITY Right 10/19/2020   Procedure: ABDOMINAL AORTOGRAM W/LOWER EXTREMITY;  Surgeon: Wellington Hampshire, MD;  Location: [facility];  Service: Cardiovascular;  Laterality: Right;   FRACTURE SURGERY       OB History     Gravida  2   Para  2   Term      Preterm      AB      Living         SAB  IAB      Ectopic      Multiple      Live Births              Family History  Problem Relation Age of Onset   Heart failure Father    Heart attack Father    Diabetes Father    Heart attack Mother    Heart failure Mother    Diabetes Mother    Stroke Sister     Social History   Tobacco Use   Smoking status: Never   Smokeless tobacco: Never  Vaping Use   Vaping Use: Never used  Substance Use Topics   Alcohol use: No    Alcohol/week: 0.0 standard drinks   Drug use: No    Home Medications Prior to Admission medications   Medication Sig Start Date End Date Taking? Authorizing Provider  doxycycline (VIBRAMYCIN) 100 MG capsule Take 1 capsule (100 mg total) by mouth 2 (two) times daily. 01/14/21  Yes Fransico Meadow, PA-C  acetaminophen (TYLENOL) 325 MG tablet Take 2 tablets (650 mg total) by mouth every 6 (six) hours as needed for mild pain (or Fever >/= 101). 08/21/18   Angiulli, Lavon Paganini, PA-C  acetaminophen-codeine (TYLENOL #3) 300-30 MG tablet TAKE 1 TO 2 TABLETS BY MOUTH EVERY 8 HOURS AS NEEDED 12/07/20   Mcarthur Rossetti, MD  apixaban Arne [place]) 2.5 MG TABS tablet Take 1 tablet by mouth 2 times daily 05/05/20   Nahser, Wonda Cheng, MD  atorvastatin (LIPITOR) 20 MG tablet Take 1 tablet by mouth once daily Patient taking differently: Take 20 mg by mouth at bedtime. 09/22/20   Nahser, Wonda Cheng, MD  Calcium Carbonate-Vitamin D 600-400 MG-UNIT tablet Take 1 tablet by mouth daily at 12 noon.     [provider]  carvedilol (COREG) 3.125 MG tablet TAKE 1 TABLET BY MOUTH TWICE DAILY WITH MEALS 09/22/20   Nahser, Wonda Cheng, MD  Cholecalciferol 25 MCG (1000 UT) capsule Take 1 capsule (1,000 Units total) by mouth daily. 08/21/18   Angiulli, Lavon Paganini, PA-C  esomeprazole (NEXIUM) 20 MG capsule Take 20 mg by mouth in the morning.    [provider]  gabapentin (NEURONTIN) 100 MG capsule Take 1 capsule (100 mg total) by mouth 3 (three) times daily as needed. 11/22/20   Mcarthur Rossetti, MD  gentamicin cream (GARAMYCIN) 0.1 % Apply 1 application topically daily. 10/19/20   Wellington Hampshire, MD  insulin aspart (NOVOLOG) 100 UNIT/ML FlexPen Inject 6 Units into the skin 3 (three) times daily with meals.    [provider]  Insulin Degludec (TRESIBA [place]) Inject 6 Units into the skin at bedtime.    [provider]  meloxicam (MOBIC) 7.5 MG tablet Take 1 tablet (7.5 mg total) by mouth daily. 01/09/21   Edrick Kins, DPM  Multiple Vitamins-Minerals (CENTRUM SILVER 50+WOMEN PO) Take 1 tablet by mouth daily.    [provider]  nitroGLYCERIN (NITROSTAT) 0.4 MG SL tablet Place 1 tablet (0.4 mg total) under the tongue every 5 (five) minutes as needed for chest pain. 02/04/20    Nahser, Wonda Cheng, MD  Nutritional Supplements (ENSURE ACTIVE) LIQD Take 118.5-237 mLs by mouth in the morning. Patient only drink half of 237MLS  occasionally daily    [provider]  predniSONE (DELTASONE) 1 MG tablet Take 4 mg by mouth in the morning. 01/24/19   [provider]  RESTASIS 0.05 % ophthalmic emulsion Place 1 drop into both eyes 2 (  two) times daily. 02/13/19   [provider]  traMADol (ULTRAM) 50 MG tablet Take 50 mg by mouth every 6 (six) hours as needed for moderate pain. 07/01/19   [provider]    Allergies    Cholestyramine, Compazine [prochlorperazine edisylate], and Sulfonamide derivatives  Review of Systems   Review of Systems  All other systems reviewed and are negative.  Physical Exam Updated Vital Signs BP (!) 96/36 (BP Location: Left Arm)   Pulse 74   Temp 98.8 F (37.1 C) (Oral)   Resp 18   Ht 5\' 1"  (1.549 m)   Wt 56.2 kg   SpO2 96%   BMI 23.43 kg/m   Physical Exam Vitals reviewed.  HENT:     Head: Normocephalic.  Cardiovascular:     Rate and Rhythm: Normal rate.  Pulmonary:     Effort: Pulmonary effort is normal.  Musculoskeletal:        General: Swelling and tenderness present.     Comments: Cm red area top of right foot,  1cm dark center,    Skin:    General: Skin is warm.  Neurological:     General: No focal deficit present.     Mental Status: She is alert.  Psychiatric:        Mood and Affect: Mood normal.    ED Results / Procedures / Treatments   Labs (all labs ordered are listed, but only abnormal results are displayed) Labs Reviewed - No data to display  EKG None  Radiology No results found.  Procedures Procedures   Medications Ordered in ED Medications - No data to display  ED Course  I have reviewed the triage vital signs and the nursing notes.  Pertinent labs & imaging results that were available during my care of the patient were reviewed by me and considered in my medical  decision making (see chart for details).    MDM Rules/Calculators/A&P                           MDM:  dressing to wound.  Pt placed in padded wrap to protect area.  I suspect are is second to blister/pressure sore from surgical shoe.  Pt given rx fof doxycycline.  I advised see Dr. Doran Durand as scheduled for recheck  Final Clinical Impression(s) / ED Diagnoses Final diagnoses:  Ulcer of foot, right, limited to breakdown of skin ([place])    Rx / DC Orders ED Discharge Orders          Ordered    doxycycline (VIBRAMYCIN) 100 MG capsule  2 times daily        01/14/21 1720          An After Visit Summary was printed and given to the patient.    Fransico Meadow, Hershal Coria 01/14/21 2204    Sherwood Gambler, MD 01/15/21 [phone]

## 2021-01-15 NOTE — Procedures (Signed)
Lumbosacral Transforaminal Epidural Steroid Injection - Sub-Pedicular Approach with Fluoroscopic Guidance  Patient: Shelby Walker      Date of Birth: 26-Oct-1933 MRN: [id number] PCP: Reynold Bowen, MD      Visit Date: 01/09/2021   Universal Protocol:    Date/Time: 01/09/2021  Consent Given By: the patient  Position: PRONE  Additional Comments: Vital signs were monitored before and after the procedure. Patient was prepped and draped in the usual sterile fashion. The correct patient, procedure, and site was verified.   Injection Procedure Details:   Procedure diagnoses: Lumbar radiculopathy [M54.16]    Meds Administered:  Meds ordered this encounter  Medications   methylPREDNISolone acetate (DEPO-MEDROL) injection 80 mg    Laterality: Left  Location/Site: L3  Needle:5.0 in., 22 ga.  Short bevel or Quincke spinal needle  Needle Placement: Transforaminal  Findings:    -Comments: Excellent flow of contrast along the nerve, nerve root and into the epidural space.  Procedure Details: After squaring off the end-plates to get a true AP view, the C-arm was positioned so that an oblique view of the foramen as noted above was visualized. The target area is just inferior to the "nose of the scotty dog" or sub pedicular. The soft tissues overlying this structure were infiltrated with 2-3 ml. of 1% Lidocaine without Epinephrine.  The spinal needle was inserted toward the target using a "trajectory" view along the fluoroscope beam.  Under AP and lateral visualization, the needle was advanced so it did not puncture dura and was located close the 6 O'Clock position of the pedical in AP tracterory. Biplanar projections were used to confirm position. Aspiration was confirmed to be negative for CSF and/or blood. A 1-2 ml. volume of Isovue-250 was injected and flow of contrast was noted at each level. Radiographs were obtained for documentation purposes.   After attaining the desired flow of  contrast documented above, a 0.5 to 1.0 ml test dose of 0.25% Marcaine was injected into each respective transforaminal space.  The patient was observed for 90 seconds post injection.  After no sensory deficits were reported, and normal lower extremity motor function was noted,   the above injectate was administered so that equal amounts of the injectate were placed at each foramen (level) into the transforaminal epidural space.   Additional Comments:  The patient tolerated the procedure well Dressing: 2 x 2 sterile gauze and Band-Aid    Post-procedure details: Patient was observed during the procedure. Post-procedure instructions were reviewed.  Patient left the clinic in stable condition.

## 2021-01-16 ENCOUNTER — Inpatient Hospital Stay (HOSPITAL_COMMUNITY)
Admission: RE | Admit: 2021-01-16 | Discharge: 2021-01-24 | DRG: 616 | Disposition: A | Discharge status: Rehabilitation Facility | Payer: Medicare Other | Source: Ambulatory Visit | Attending: Internal Medicine | Admitting: Internal Medicine

## 2021-01-16 DIAGNOSIS — E1122 Type 2 diabetes mellitus with diabetic chronic kidney disease: Secondary | ICD-10-CM | POA: Diagnosis present

## 2021-01-16 DIAGNOSIS — E11628 Type 2 diabetes mellitus with other skin complications: Secondary | ICD-10-CM | POA: Diagnosis present

## 2021-01-16 DIAGNOSIS — I10 Essential (primary) hypertension: Secondary | ICD-10-CM | POA: Diagnosis not present

## 2021-01-16 DIAGNOSIS — Z20822 Contact with and (suspected) exposure to covid-19: Secondary | ICD-10-CM | POA: Diagnosis present

## 2021-01-16 DIAGNOSIS — E46 Unspecified protein-calorie malnutrition: Secondary | ICD-10-CM | POA: Diagnosis not present

## 2021-01-16 DIAGNOSIS — I480 Paroxysmal atrial fibrillation: Secondary | ICD-10-CM | POA: Diagnosis present

## 2021-01-16 DIAGNOSIS — I209 Angina pectoris, unspecified: Secondary | ICD-10-CM | POA: Diagnosis not present

## 2021-01-16 DIAGNOSIS — D84821 Immunodeficiency due to drugs: Secondary | ICD-10-CM | POA: Diagnosis present

## 2021-01-16 DIAGNOSIS — E43 Unspecified severe protein-calorie malnutrition: Secondary | ICD-10-CM | POA: Insufficient documentation

## 2021-01-16 DIAGNOSIS — M25561 Pain in right knee: Secondary | ICD-10-CM | POA: Diagnosis not present

## 2021-01-16 DIAGNOSIS — I129 Hypertensive chronic kidney disease with stage 1 through stage 4 chronic kidney disease, or unspecified chronic kidney disease: Secondary | ICD-10-CM | POA: Diagnosis present

## 2021-01-16 DIAGNOSIS — Z79899 Other long term (current) drug therapy: Secondary | ICD-10-CM

## 2021-01-16 DIAGNOSIS — E782 Mixed hyperlipidemia: Secondary | ICD-10-CM | POA: Diagnosis present

## 2021-01-16 DIAGNOSIS — Z791 Long term (current) use of non-steroidal anti-inflammatories (NSAID): Secondary | ICD-10-CM

## 2021-01-16 DIAGNOSIS — M7522 Bicipital tendinitis, left shoulder: Secondary | ICD-10-CM | POA: Diagnosis not present

## 2021-01-16 DIAGNOSIS — I4891 Unspecified atrial fibrillation: Secondary | ICD-10-CM | POA: Diagnosis not present

## 2021-01-16 DIAGNOSIS — G546 Phantom limb syndrome with pain: Secondary | ICD-10-CM | POA: Diagnosis not present

## 2021-01-16 DIAGNOSIS — D62 Acute posthemorrhagic anemia: Secondary | ICD-10-CM | POA: Diagnosis not present

## 2021-01-16 DIAGNOSIS — M79671 Pain in right foot: Secondary | ICD-10-CM | POA: Diagnosis not present

## 2021-01-16 DIAGNOSIS — D631 Anemia in chronic kidney disease: Secondary | ICD-10-CM | POA: Diagnosis present

## 2021-01-16 DIAGNOSIS — M25512 Pain in left shoulder: Secondary | ICD-10-CM | POA: Diagnosis not present

## 2021-01-16 DIAGNOSIS — N1832 Chronic kidney disease, stage 3b: Secondary | ICD-10-CM | POA: Diagnosis present

## 2021-01-16 DIAGNOSIS — M810 Age-related osteoporosis without current pathological fracture: Secondary | ICD-10-CM | POA: Diagnosis not present

## 2021-01-16 DIAGNOSIS — I959 Hypotension, unspecified: Secondary | ICD-10-CM | POA: Diagnosis not present

## 2021-01-16 DIAGNOSIS — H9190 Unspecified hearing loss, unspecified ear: Secondary | ICD-10-CM | POA: Diagnosis present

## 2021-01-16 DIAGNOSIS — R509 Fever, unspecified: Secondary | ICD-10-CM | POA: Diagnosis not present

## 2021-01-16 DIAGNOSIS — A0471 Enterocolitis due to Clostridium difficile, recurrent: Secondary | ICD-10-CM | POA: Diagnosis present

## 2021-01-16 DIAGNOSIS — I340 Nonrheumatic mitral (valve) insufficiency: Secondary | ICD-10-CM | POA: Diagnosis present

## 2021-01-16 DIAGNOSIS — E11621 Type 2 diabetes mellitus with foot ulcer: Secondary | ICD-10-CM | POA: Diagnosis present

## 2021-01-16 DIAGNOSIS — E1165 Type 2 diabetes mellitus with hyperglycemia: Secondary | ICD-10-CM | POA: Diagnosis present

## 2021-01-16 DIAGNOSIS — E1169 Type 2 diabetes mellitus with other specified complication: Secondary | ICD-10-CM | POA: Diagnosis not present

## 2021-01-16 DIAGNOSIS — Z4781 Encounter for orthopedic aftercare following surgical amputation: Secondary | ICD-10-CM | POA: Diagnosis not present

## 2021-01-16 DIAGNOSIS — N289 Disorder of kidney and ureter, unspecified: Secondary | ICD-10-CM | POA: Diagnosis not present

## 2021-01-16 DIAGNOSIS — E785 Hyperlipidemia, unspecified: Secondary | ICD-10-CM | POA: Diagnosis not present

## 2021-01-16 DIAGNOSIS — L97519 Non-pressure chronic ulcer of other part of right foot with unspecified severity: Secondary | ICD-10-CM | POA: Diagnosis present

## 2021-01-16 DIAGNOSIS — E1152 Type 2 diabetes mellitus with diabetic peripheral angiopathy with gangrene: Secondary | ICD-10-CM | POA: Diagnosis present

## 2021-01-16 DIAGNOSIS — M869 Osteomyelitis, unspecified: Secondary | ICD-10-CM | POA: Diagnosis not present

## 2021-01-16 DIAGNOSIS — Z6822 Body mass index (BMI) 22.0-22.9, adult: Secondary | ICD-10-CM | POA: Diagnosis not present

## 2021-01-16 DIAGNOSIS — Z882 Allergy status to sulfonamides status: Secondary | ICD-10-CM

## 2021-01-16 DIAGNOSIS — K5900 Constipation, unspecified: Secondary | ICD-10-CM | POA: Diagnosis not present

## 2021-01-16 DIAGNOSIS — M1990 Unspecified osteoarthritis, unspecified site: Secondary | ICD-10-CM | POA: Diagnosis not present

## 2021-01-16 DIAGNOSIS — Z794 Long term (current) use of insulin: Secondary | ICD-10-CM | POA: Diagnosis not present

## 2021-01-16 DIAGNOSIS — Z833 Family history of diabetes mellitus: Secondary | ICD-10-CM

## 2021-01-16 DIAGNOSIS — E1142 Type 2 diabetes mellitus with diabetic polyneuropathy: Secondary | ICD-10-CM | POA: Diagnosis present

## 2021-01-16 DIAGNOSIS — Z66 Do not resuscitate: Secondary | ICD-10-CM | POA: Diagnosis present

## 2021-01-16 DIAGNOSIS — M069 Rheumatoid arthritis, unspecified: Secondary | ICD-10-CM | POA: Diagnosis present

## 2021-01-16 DIAGNOSIS — L97419 Non-pressure chronic ulcer of right heel and midfoot with unspecified severity: Secondary | ICD-10-CM | POA: Diagnosis not present

## 2021-01-16 DIAGNOSIS — L089 Local infection of the skin and subcutaneous tissue, unspecified: Secondary | ICD-10-CM | POA: Diagnosis present

## 2021-01-16 DIAGNOSIS — G8918 Other acute postprocedural pain: Secondary | ICD-10-CM | POA: Diagnosis not present

## 2021-01-16 DIAGNOSIS — K219 Gastro-esophageal reflux disease without esophagitis: Secondary | ICD-10-CM | POA: Diagnosis present

## 2021-01-16 DIAGNOSIS — Z89432 Acquired absence of left foot: Secondary | ICD-10-CM | POA: Diagnosis not present

## 2021-01-16 DIAGNOSIS — Z885 Allergy status to narcotic agent status: Secondary | ICD-10-CM

## 2021-01-16 DIAGNOSIS — Z7952 Long term (current) use of systemic steroids: Secondary | ICD-10-CM

## 2021-01-16 DIAGNOSIS — E875 Hyperkalemia: Secondary | ICD-10-CM | POA: Diagnosis present

## 2021-01-16 DIAGNOSIS — Z89431 Acquired absence of right foot: Secondary | ICD-10-CM | POA: Diagnosis not present

## 2021-01-16 DIAGNOSIS — Z8249 Family history of ischemic heart disease and other diseases of the circulatory system: Secondary | ICD-10-CM

## 2021-01-16 DIAGNOSIS — Z959 Presence of cardiac and vascular implant and graft, unspecified: Secondary | ICD-10-CM

## 2021-01-16 DIAGNOSIS — I739 Peripheral vascular disease, unspecified: Secondary | ICD-10-CM | POA: Diagnosis present

## 2021-01-16 DIAGNOSIS — J208 Acute bronchitis due to other specified organisms: Secondary | ICD-10-CM | POA: Diagnosis not present

## 2021-01-16 DIAGNOSIS — Z888 Allergy status to other drugs, medicaments and biological substances status: Secondary | ICD-10-CM

## 2021-01-16 DIAGNOSIS — I96 Gangrene, not elsewhere classified: Secondary | ICD-10-CM | POA: Diagnosis not present

## 2021-01-16 DIAGNOSIS — Z8619 Personal history of other infectious and parasitic diseases: Secondary | ICD-10-CM | POA: Diagnosis not present

## 2021-01-16 DIAGNOSIS — R7309 Other abnormal glucose: Secondary | ICD-10-CM | POA: Diagnosis not present

## 2021-01-16 DIAGNOSIS — Z7901 Long term (current) use of anticoagulants: Secondary | ICD-10-CM

## 2021-01-16 LAB — RESP PANEL BY RT-PCR (FLU A&B, COVID) ARPGX2
Influenza A by PCR: NEGATIVE
Influenza B by PCR: NEGATIVE
SARS Coronavirus 2 by RT PCR: NEGATIVE

## 2021-01-16 LAB — COMPREHENSIVE METABOLIC PANEL
ALT: 18 U/L (ref 0–44)
AST: 17 U/L (ref 15–41)
Albumin: 2.9 g/dL — ABNORMAL LOW (ref 3.5–5.0)
Alkaline Phosphatase: 55 U/L (ref 38–126)
Anion gap: 9 (ref 5–15)
BUN: 44 mg/dL — ABNORMAL HIGH (ref 8–23)
CO2: 27 mmol/L (ref 22–32)
Calcium: 8.8 mg/dL — ABNORMAL LOW (ref 8.9–10.3)
Chloride: 103 mmol/L (ref 98–111)
Creatinine, Ser: 1.25 mg/dL — ABNORMAL HIGH (ref 0.44–1.00)
GFR, Estimated: 42 mL/min — ABNORMAL LOW (ref >60)
Glucose, Bld: 321 mg/dL — ABNORMAL HIGH (ref 70–99)
Potassium: 5.2 mmol/L — ABNORMAL HIGH (ref 3.5–5.1)
Sodium: 139 mmol/L (ref 135–145)
Total Bilirubin: 0.6 mg/dL (ref 0.3–1.2)
Total Protein: 6 g/dL — ABNORMAL LOW (ref 6.5–8.1)

## 2021-01-16 LAB — CBC WITH DIFFERENTIAL/PLATELET
Abs Immature Granulocytes: 0.03 10*3/uL (ref 0.00–0.07)
Basophils Absolute: 0 10*3/uL (ref 0.0–0.1)
Basophils Relative: 0 %
Eosinophils Absolute: 0.1 10*3/uL (ref 0.0–0.5)
Eosinophils Relative: 1 %
HCT: 32.4 % — ABNORMAL LOW (ref 36.0–46.0)
Hemoglobin: 10.6 g/dL — ABNORMAL LOW (ref 12.0–15.0)
Immature Granulocytes: 0 %
Lymphocytes Relative: 14 %
Lymphs Abs: 1.4 10*3/uL (ref 0.7–4.0)
MCH: 30.5 pg (ref 26.0–34.0)
MCHC: 32.7 g/dL (ref 30.0–36.0)
MCV: 93.1 fL (ref 80.0–100.0)
Monocytes Absolute: 0.5 10*3/uL (ref 0.1–1.0)
Monocytes Relative: 5 %
Neutro Abs: 8.1 10*3/uL — ABNORMAL HIGH (ref 1.7–7.7)
Neutrophils Relative %: 80 %
Platelets: 269 10*3/uL (ref 150–400)
RBC: 3.48 MIL/uL — ABNORMAL LOW (ref 3.87–5.11)
RDW: 13.3 % (ref 11.5–15.5)
WBC: 10.1 10*3/uL (ref 4.0–10.5)
nRBC: 0 % (ref 0.0–0.2)

## 2021-01-16 LAB — C-REACTIVE PROTEIN: CRP: 9 mg/dL — ABNORMAL HIGH (ref <1.0)

## 2021-01-16 LAB — PREALBUMIN: Prealbumin: 15.2 mg/dL — ABNORMAL LOW (ref 18–38)

## 2021-01-16 LAB — GLUCOSE, CAPILLARY: Glucose-Capillary: 106 mg/dL — ABNORMAL HIGH (ref 70–99)

## 2021-01-16 LAB — SEDIMENTATION RATE: Sed Rate: 50 mm/hr — ABNORMAL HIGH (ref 0–22)

## 2021-01-16 LAB — LACTIC ACID, PLASMA: Lactic Acid, Venous: 1.3 mmol/L (ref 0.5–1.9)

## 2021-01-16 MED ORDER — INSULIN DEGLUDEC 100 UNIT/ML ~~LOC~~ SOLN: 6.0000 [IU] | Freq: Every day | SUBCUTANEOUS | Status: DC

## 2021-01-16 MED ORDER — ONDANSETRON HCL 4 MG PO TABS
4.0000 mg | ORAL_TABLET | Freq: Four times a day (QID) | ORAL | Status: DC | PRN
  Administered 2021-01-18: 4 mg via ORAL
  Filled 2021-01-16: qty 1

## 2021-01-16 MED ORDER — SACCHAROMYCES BOULARDII 250 MG PO CAPS
250.0000 mg | ORAL_CAPSULE | Freq: Two times a day (BID) | ORAL | Status: DC
  Administered 2021-01-16 – 2021-01-24 (×16): 250 mg via ORAL
  Filled 2021-01-16 (×16): qty 1

## 2021-01-16 MED ORDER — CARVEDILOL 3.125 MG PO TABS
3.1250 mg | ORAL_TABLET | Freq: Two times a day (BID) | ORAL | Status: DC
  Administered 2021-01-16 – 2021-01-20 (×8): 3.125 mg via ORAL
  Filled 2021-01-16 (×8): qty 1

## 2021-01-16 MED ORDER — CYCLOSPORINE 0.05 % OP EMUL
1.0000 [drp] | Freq: Two times a day (BID) | OPHTHALMIC | Status: DC
  Administered 2021-01-16 – 2021-01-24 (×15): 1 [drp] via OPHTHALMIC
  Filled 2021-01-16 (×18): qty 30

## 2021-01-16 MED ORDER — ACETAMINOPHEN 650 MG RE SUPP: 650.0000 mg | Freq: Four times a day (QID) | RECTAL | Status: DC | PRN

## 2021-01-16 MED ORDER — HYDRALAZINE HCL 20 MG/ML IJ SOLN: 5.0000 mg | INTRAMUSCULAR | Status: DC | PRN

## 2021-01-16 MED ORDER — TRAMADOL HCL 50 MG PO TABS
50.0000 mg | ORAL_TABLET | Freq: Four times a day (QID) | ORAL | Status: DC | PRN
  Administered 2021-01-17 – 2021-01-24 (×18): 50 mg via ORAL
  Filled 2021-01-16 (×19): qty 1

## 2021-01-16 MED ORDER — PREDNISONE 1 MG PO TABS
4.0000 mg | ORAL_TABLET | Freq: Every morning | ORAL | Status: DC
  Administered 2021-01-17 – 2021-01-24 (×8): 4 mg via ORAL
  Filled 2021-01-16 (×8): qty 4

## 2021-01-16 MED ORDER — INSULIN ASPART 100 UNIT/ML IJ SOLN
0.0000 [IU] | Freq: Every day | INTRAMUSCULAR | Status: DC
Start: 2021-01-16 — End: 2021-01-21
  Administered 2021-01-18 – 2021-01-20 (×2): 2 [IU] via SUBCUTANEOUS

## 2021-01-16 MED ORDER — INSULIN GLARGINE-YFGN 100 UNIT/ML ~~LOC~~ SOLN
6.0000 [IU] | Freq: Every day | SUBCUTANEOUS | Status: DC
Start: 2021-01-16 — End: 2021-01-17
  Filled 2021-01-16 (×2): qty 0.06

## 2021-01-16 MED ORDER — ACETAMINOPHEN 325 MG PO TABS
650.0000 mg | ORAL_TABLET | Freq: Four times a day (QID) | ORAL | Status: DC | PRN
  Administered 2021-01-19: 22:00:00 650 mg via ORAL
  Filled 2021-01-16 (×2): qty 2

## 2021-01-16 MED ORDER — ENOXAPARIN SODIUM 30 MG/0.3ML IJ SOSY
30.0000 mg | PREFILLED_SYRINGE | INTRAMUSCULAR | Status: DC
  Administered 2021-01-16: 30 mg via SUBCUTANEOUS
  Filled 2021-01-16: qty 0.3

## 2021-01-16 MED ORDER — ONDANSETRON HCL 4 MG/2ML IJ SOLN
4.0000 mg | Freq: Four times a day (QID) | INTRAMUSCULAR | Status: DC | PRN
  Administered 2021-01-18: 4 mg via INTRAVENOUS

## 2021-01-16 MED ORDER — GABAPENTIN 100 MG PO CAPS
100.0000 mg | ORAL_CAPSULE | Freq: Three times a day (TID) | ORAL | Status: DC | PRN
  Administered 2021-01-18 – 2021-01-24 (×9): 100 mg via ORAL
  Filled 2021-01-16 (×9): qty 1

## 2021-01-16 MED ORDER — DOCUSATE SODIUM 100 MG PO CAPS
100.0000 mg | ORAL_CAPSULE | Freq: Two times a day (BID) | ORAL | Status: DC
  Administered 2021-01-17 – 2021-01-18 (×3): 100 mg via ORAL
  Filled 2021-01-16 (×4): qty 1

## 2021-01-16 MED ORDER — SODIUM CHLORIDE 0.9 % IV SOLN
2.0000 g | INTRAVENOUS | Status: DC
  Administered 2021-01-16: 2 g via INTRAVENOUS
  Filled 2021-01-16: qty 20

## 2021-01-16 MED ORDER — OXYCODONE HCL 5 MG PO TABS
5.0000 mg | ORAL_TABLET | ORAL | Status: DC | PRN
  Filled 2021-01-16: qty 1

## 2021-01-16 MED ORDER — ATORVASTATIN CALCIUM 10 MG PO TABS
20.0000 mg | ORAL_TABLET | Freq: Every day | ORAL | Status: DC
  Administered 2021-01-16 – 2021-01-23 (×8): 20 mg via ORAL
  Filled 2021-01-16 (×8): qty 2

## 2021-01-16 MED ORDER — POLYETHYLENE GLYCOL 3350 17 G PO PACK: 17.0000 g | PACK | Freq: Every day | ORAL | Status: DC | PRN

## 2021-01-16 MED ORDER — INSULIN ASPART 100 UNIT/ML IJ SOLN
0.0000 [IU] | Freq: Three times a day (TID) | INTRAMUSCULAR | Status: DC
  Administered 2021-01-16 – 2021-01-17 (×3): 5 [IU] via SUBCUTANEOUS
  Administered 2021-01-18: 3 [IU] via SUBCUTANEOUS
  Administered 2021-01-18: 5 [IU] via SUBCUTANEOUS
  Administered 2021-01-19: 17:00:00 3 [IU] via SUBCUTANEOUS
  Administered 2021-01-19 (×2): 1 [IU] via SUBCUTANEOUS
  Administered 2021-01-20: 3 [IU] via SUBCUTANEOUS
  Administered 2021-01-20 – 2021-01-21 (×2): 2 [IU] via SUBCUTANEOUS

## 2021-01-16 MED ORDER — PANTOPRAZOLE SODIUM 40 MG PO TBEC
40.0000 mg | DELAYED_RELEASE_TABLET | Freq: Every day | ORAL | Status: DC
  Administered 2021-01-17 – 2021-01-24 (×8): 40 mg via ORAL
  Filled 2021-01-16 (×8): qty 1

## 2021-01-16 MED ORDER — METRONIDAZOLE 500 MG/100ML IV SOLN
500.0000 mg | Freq: Three times a day (TID) | INTRAVENOUS | Status: AC
  Administered 2021-01-16 – 2021-01-19 (×9): 500 mg via INTRAVENOUS
  Filled 2021-01-16 (×9): qty 100

## 2021-01-16 MED ORDER — ACETAMINOPHEN-CODEINE #3 300-30 MG PO TABS
1.0000 | ORAL_TABLET | Freq: Three times a day (TID) | ORAL | Status: DC | PRN
  Administered 2021-01-16 – 2021-01-18 (×4): 1 via ORAL
  Filled 2021-01-16 (×4): qty 1

## 2021-01-16 MED ORDER — SODIUM CHLORIDE 0.9 % IV SOLN: INTRAVENOUS | Status: DC

## 2021-01-16 MED ORDER — VANCOMYCIN HCL 1000 MG/200ML IV SOLN
1000.0000 mg | INTRAVENOUS | Status: AC
  Filled 2021-01-16: qty 200

## 2021-01-16 MED ORDER — MORPHINE SULFATE (PF) 2 MG/ML IV SOLN
2.0000 mg | INTRAVENOUS | Status: DC | PRN
  Administered 2021-01-19: 10:00:00 2 mg via INTRAVENOUS
  Filled 2021-01-16: qty 1

## 2021-01-16 MED ORDER — VANCOMYCIN HCL 1250 MG/250ML IV SOLN
1250.0000 mg | Freq: Once | INTRAVENOUS | Status: AC
  Administered 2021-01-16: 1250 mg via INTRAVENOUS
  Filled 2021-01-16: qty 250

## 2021-01-16 MED ORDER — BISACODYL 5 MG PO TBEC: 5.0000 mg | DELAYED_RELEASE_TABLET | Freq: Every day | ORAL | Status: DC | PRN

## 2021-01-16 NOTE — H&P (Signed)
History and Physical    Shelby Walker BLT:[id number] DOB: [birth year] DOA: 01/16/2021  PCP: Shelby Bowen, MD Consultants:  Shelby Walker - podiatry; Shelby Walker - PM&R; Shelby Walker/Shelby Walker - cardiology; Shelby Walker - rheumatology Patient coming from: Home - lives alone; NOK: Daughter, Shelby Walker, [phone]  Chief Complaint: Foot infection  HPI: Shelby Walker is a 87 y.o. female with medical history significant of DM; RA; stage 3b CKD; PAD; afib on Eliquis; and HTN presenting with a diabetic foot ulcer. She reports that she has had issues with her R foot for months.  Previously, she had an ulcer on the bottom of her foot and took antibiotics and it healed; an aortogram showed adequate circulation for wound healing at that time.  She was last seen on 11/7 by Dr. Amalia Walker for R foot edema/erythema and was thought to have gout; she was given an rx for meloxicam.  This time, she noticed a dorsal foot wound that spontaneously appeared Saturday and was apparently necrotic at that time.  She went to the ER and was given Doxy and referred to Dr. Doran Walker.  She saw him today and he arranged for direct admission for probable R TMA.    Aortogram on 10/19/20 showed no significant aortoiliac disease and moderate nonobstructive SFA/popliteal disease, "excellent inline flow to the foot" with no need for revascularization.  She has a h/o recurrent C diff coliitis and her family is appropriately concerned about this issue since she will require antibiotics at this time.    Direct admission, per Dr. Doran Walker: She was seen in the ER on 11/12 and given Doxy with plan for outpatient f/u with Dr. Doran Walker today.  She has been following chronically with podiatry and has failed multiple rounds of antibiotics.  Immunocompromised from RA meds.  Today, with gangrenous foot, gross purulence and necrosis.  Likely needs transmetatarsal amputation, IV antibiotics (Vanc/Zosyn) in the interim.   She is hemodynamically stable.  Will need an Eliquis washout period.   Likely scheduled for surgery Wednesday.   Review of Systems: As per HPI; otherwise review of systems reviewed and negative.   Ambulatory Status:  Ambulates with a walker   Past Medical History:  Diagnosis Date   Bruit    Carotid Doppler showed no significant abnormality     9per patient)   C. difficile colitis 07/2018   with severe sepsis   Chest pain, unspecified    Nuclear, May, 2008, no scar or ischemia   Diabetes mellitus    Diverticulosis    Dyslipidemia    Ejection fraction    EF 55-60%, echo, February, 2011 // Echocardiogram 8/21: EF 60-65, no RWMA, Gr 1 DD, GLS -14%, normal RVSF, mild LAE, trivial MR, mild MS (mean 4 mmHg), RVSP 23.4    GERD (gastroesophageal reflux disease)    Mitral regurgitation April 21, 2009   mild,  echo, February, 2011   Osteoporosis    Palpitations    possible very brief atrial fibrillation on monitor and possible reentrant tachycardia   Psoriasis    Rheumatoid arthritis ([place])     Past Surgical History:  Procedure Laterality Date   ABDOMINAL AORTOGRAM W/LOWER EXTREMITY Right 10/19/2020   Procedure: ABDOMINAL AORTOGRAM W/LOWER EXTREMITY;  Surgeon: Shelby Hampshire, MD;  Location: [facility];  Service: Cardiovascular;  Laterality: Right;   FRACTURE SURGERY      Social History   Socioeconomic History   Marital status: Widowed    Spouse name: Shelby Walker   Number of children: 2   Years of education:  13   Highest education level: Associate degree: academic program  Occupational History    Employer: RETIRED  Tobacco Use   Smoking status: Never   Smokeless tobacco: Never  Vaping Use   Vaping Use: Never used  Substance and Sexual Activity   Alcohol use: No    Alcohol/week: 0.0 standard drinks   Drug use: No   Sexual activity: Yes    Birth control/protection: Post-menopausal  Other Topics Concern   Not on file  Social History Narrative   She is married to [place] and lives with him 2 children   Retired   No alcohol or drugs  and never smoker   Social Determinants of Radio broadcast assistant Strain: Not on file  Food Insecurity: Not on file  Transportation Needs: Not on file  Physical Activity: Not on file  Stress: Not on file  Social Connections: Not on file  Intimate Partner Violence: Not on file    Allergies  Allergen Reactions   Cholestyramine     Possible- Makes throat burn. Patient said she can't take it    Compazine [Prochlorperazine Edisylate] Swelling    REACTION: " tongue swell and unable to swallow"   Sulfonamide Derivatives Other (See Comments)    REACTION: " broke out with fine itching bumps"   Hydrocodone Other (See Comments)   Infliximab     Other reaction(s): rash   Prochlorperazine     Other reaction(s): Unknown   Rosuvastatin     Other reaction(s): muscle aches   Sulfa Antibiotics     Other reaction(s): tongue swelling    Family History  Problem Relation Age of Onset   Heart failure Father    Heart attack Father    Diabetes Father    Heart attack Mother    Heart failure Mother    Diabetes Mother    Stroke Sister     Prior to Admission medications   Medication Sig Start Date End Date Taking? Authorizing Provider  acetaminophen (TYLENOL) 325 MG tablet Take 2 tablets (650 mg total) by mouth every 6 (six) hours as needed for mild pain (or Fever >/= 101). 08/21/18   Angiulli, Lavon Paganini, PA-C  acetaminophen-codeine (TYLENOL #3) 300-30 MG tablet TAKE 1 TO 2 TABLETS BY MOUTH EVERY 8 HOURS AS NEEDED 12/07/20   Mcarthur Rossetti, MD  apixaban Arne [place]) 2.5 MG TABS tablet Take 1 tablet by mouth 2 times daily 05/05/20   Shelby Walker, Wonda Cheng, MD  atorvastatin (LIPITOR) 20 MG tablet Take 1 tablet by mouth once daily Patient taking differently: Take 20 mg by mouth at bedtime. 09/22/20   Shelby Walker, Wonda Cheng, MD  Calcium Carbonate-Vitamin D 600-400 MG-UNIT tablet Take 1 tablet by mouth daily at 12 noon.     [provider]  carvedilol (COREG) 3.125 MG tablet TAKE 1 TABLET BY MOUTH  TWICE DAILY WITH MEALS 09/22/20   Shelby Walker, Wonda Cheng, MD  Cholecalciferol 25 MCG (1000 UT) capsule Take 1 capsule (1,000 Units total) by mouth daily. 08/21/18   Angiulli, Lavon Paganini, PA-C  doxycycline (VIBRAMYCIN) 100 MG capsule Take 1 capsule (100 mg total) by mouth 2 (two) times daily. 01/14/21   Fransico Meadow, PA-C  esomeprazole (NEXIUM) 20 MG capsule Take 20 mg by mouth in the morning.    [provider]  gabapentin (NEURONTIN) 100 MG capsule Take 1 capsule (100 mg total) by mouth 3 (three) times daily as needed. 11/22/20   Mcarthur Rossetti, MD  gentamicin cream (GARAMYCIN) 0.1 % Apply 1  application topically daily. 10/19/20   Shelby Hampshire, MD  insulin aspart (NOVOLOG) 100 UNIT/ML FlexPen Inject 6 Units into the skin 3 (three) times daily with meals.    [provider]  Insulin Degludec (TRESIBA [place]) Inject 6 Units into the skin at bedtime.    [provider]  meloxicam (MOBIC) 7.5 MG tablet Take 1 tablet (7.5 mg total) by mouth daily. 01/09/21   Edrick Kins, DPM  Multiple Vitamins-Minerals (CENTRUM SILVER 50+WOMEN PO) Take 1 tablet by mouth daily.    [provider]  nitroGLYCERIN (NITROSTAT) 0.4 MG SL tablet Place 1 tablet (0.4 mg total) under the tongue every 5 (five) minutes as needed for chest pain. 02/04/20   Shelby Walker, Wonda Cheng, MD  Nutritional Supplements (ENSURE ACTIVE) LIQD Take 118.5-237 mLs by mouth in the morning. Patient only drink half of 237MLS  occasionally daily    [provider]  predniSONE (DELTASONE) 1 MG tablet Take 4 mg by mouth in the morning. 01/24/19   [provider]  RESTASIS 0.05 % ophthalmic emulsion Place 1 drop into both eyes 2 (two) times daily. 02/13/19   [provider]  traMADol (ULTRAM) 50 MG tablet Take 50 mg by mouth every 6 (six) hours as needed for moderate pain. 07/01/19   [provider]    Physical Exam: Vitals:   01/16/21 1603  BP: (!) 160/62  Pulse: 68  Resp: 10  Temp:  98.5 F (36.9 C)  TempSrc: Oral  SpO2: 99%  Weight: 54.5 kg  Height: _0  (1.549 m)     General:  Appears calm and comfortable and is in NAD Eyes:  EOMI, normal lids, iris ENT:  grossly normal hearing, lips & tongue, mmm Neck:  no LAD, masses or thyromegaly Cardiovascular:  RRR, no m/r/g. No LE edema.  Respiratory:   CTA bilaterally with no wheezes/rales/rhonchi.  Normal respiratory effort. Abdomen:  soft, NT, ND Skin:  no rash or induration seen on limited exam other than R foot Musculoskeletal:  grossly normal tone BUE/BLE, hand deviations c/w RA Lower extremity:  Marked R foot edema with 2 cm necrotic ulcer along 1st dorsal MTP      Psychiatric:  grossly normal but mildly anxious mood and affect, speech fluent and appropriate, AOx3 Neurologic:  CN 2-12 grossly intact, moves all extremities in coordinated fashion    Radiological Exams on Admission: Independently reviewed - see discussion in A/P where applicable  No results found.    Labs on Admission: I have personally reviewed the available labs and imaging studies at the time of the admission.  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 Amalia Walker and did not show osteo -Foot ulcer has been present since 11/12 with apparent necrosis -She has been  treated with Doxy since 11/12 without improvement -She was seen by ortho  today and appears to need TMA  -Will treat with IV antibiotics (Rocephin/Flagyl/Vancomycin) - she has immunocompromise due to her RA therapies -I have ordered ABIs in case revascularization may be indicated, but aortogram was recently negative -LE wound order set utilized including labs (CRP, ESR, A1c, prealbumin, and blood cultures) and consults (diabetes coordinator; peripheral vascular navigator; TOC team; wound care; and nutrition)  -Dr. Doran Walker to see, likely surgery Wednesday  H/o recurrent C diff infection -They are appropriately concerned about recurrence of this infection from 2+ years ago -Will add probiotics -Recommend more judicious use of antibiotics and possibly even stopping once source control is obtained - but for now the greater concern is severe infection/sepsis developing from untreated infection  Stage 3b CKD -Creatinine appears to be stable at this time -Attempt to minimize nephrotoxic medications where possible -F/u BMP   Afib, on Eliquis -She is rate controlled on Coreg -Hold Eliquis pending surgery Wednesday (hopefully)   HTN -Hold Coreg for now based on hypotension in the ER -Consider resumption of Coreg tomorrow   DM -A1c was 8.8 on 07/24/18 -Continue Tresiba -Will cover with sensitive-scale SSI -Continue Neurontin   HLD -Continue Lipitor   RA -Previously on Enbrel but now receiving monthly infusions of another DMARD; due for next infusion on Monday, 11/21 -Also takes daily prednisone 4 mg -Will continue prednisone for now although this may limit her wound healing  DNR -I have discussed code status with the patient and her family and they are in agreement that the patient would not desire resuscitation and would prefer to die a natural death should that situation arise. -She will need a gold out of facility DNR form at the time of discharge     Note: This patient has been  tested and is pending for the novel coronavirus COVID-19.   Level of care: Med-Surg DVT prophylaxis:  Lovenox  Code Status:  DNR - confirmed with patient/family Family Communication: Daughter and son were present throughout evaluation. Disposition Plan:  The patient is from: home  Anticipated d/c is to: be determined.  Anticipated d/c date will depend on clinical response to treatment, likely late this week  Patient is currently: acutely ill Consults called: Orthopedics; diabetes coordinator; peripheral vascular navigator; TOC team; wound care; and nutrition Admission status:  Admit - It is my clinical opinion that admission to INPATIENT is reasonable and necessary because of the expectation that this patient will require hospital care that crosses at least 2 midnights to treat this condition based on the medical complexity of the problems presented.  Given the aforementioned information, the predictability of an adverse outcome is felt to be significant.    Karmen Bongo MD [facility]   How to contact the [facility] Attending or Consulting provider [place] or covering provider during after hours [place], for this patient?  Check the care team in [facility] and look for a) attending/consulting [facility] provider listed and b) the [facility] team listed Log into [email or web address] and use 's universal password to access. If you do not have the password, please contact the hospital operator. Locate the [facility] provider you are looking for under [facility] and page to a number that you can be directly reached. If you still have difficulty reaching the provider, please page the [facility] (Director on Call) for the Hospitalists listed on amion for assistance.   01/16/2021, 6:15 PM

## 2021-01-16 NOTE — Progress Notes (Signed)
Pharmacy Antibiotic Note  Shelby Walker is a 87 y.o. female admitted on 01/16/2021 with cellulitis/ongoing foot infection.  Pharmacy has been consulted for Vancomycin dosing.  Also receiving Ceftriaxone and Metronidazole  Plan: Vancomycin 1250 mg iv x 1 now Vancomycin 1 gram IV q 48 hours starting 11/16 (AUC of 510 using Scr of 1.25)   Height: 5\' 1"  (154.9 cm) Weight: 54.5 kg (120 lb 2.4 oz) IBW/kg (Calculated) : 47.8  Temp (24hrs), Avg:98.5 F (36.9 C), Min:98.5 F (36.9 C), Max:98.5 F (36.9 C)  Recent Labs  Lab 01/16/21 1642  WBC 10.1  CREATININE 1.25*  LATICACIDVEN 1.3    Estimated Creatinine Clearance: 24.8 mL/min (A) (by C-G formula based on SCr of 1.25 mg/dL (H)).    Allergies  Allergen Reactions   Cholestyramine     Possible- Makes throat burn. Patient said she can't take it    Compazine [Prochlorperazine Edisylate] Swelling    REACTION: " tongue swell and unable to swallow"   Sulfonamide Derivatives Other (See Comments)    REACTION: " broke out with fine itching bumps"   Hydrocodone Other (See Comments)   Infliximab     Other reaction(s): rash   Prochlorperazine     Other reaction(s): Unknown   Rosuvastatin     Other reaction(s): muscle aches   Sulfa Antibiotics     Other reaction(s): tongue swelling     Thank you for allowing pharmacy to be a part of this patient's care. Anette Guarneri, PharmD  01/16/2021 6:00 PM

## 2021-01-16 NOTE — Consult Note (Addendum)
WOC Nurse Consult Note: Reason for Consult:Wound at base of right great toe, dorsal aspect. Patient has been seen in the community for this wound by Podiatric Medicine, specifically Dr. Daylene Katayama.  She was last seen by that Provider on 01/09/21. See also photo taken by Dr. Lorin Mercy and uploaded to the EHR, Media Tab. Wound type: infectious vs inflammatory  Pressure Injury POA: N/A Measurement:To be measured by the Bedside RN. I have communicated with two RNs caring for her this evening to request measurements. Wound VJK:QASU vs eschar Drainage (amount, consistency, odor) small amount of purulent, exudate Periwound:mild erythema, no fluctuance Dressing procedure/placement/frequency: I have communicated with Dr. Lorin Mercy regarding the POC and will provide conservative care guidance for Nursing via the Orders. I recommend consulting with Podiatric Medicine (Dr. Amalia Hailey) on this patient or Orthopedics if he would rather defer to them in house. In the interim, I will aske Nursing to wash the foot twice daily with soap and waster, rinse and dry thoroughly. The lesion should be painted with a betadine swabstick for both the antimicrobial and astringent properties. This will be allowed to air-dry and when dry, covered with a dry gauze dressing. The dressing is to be secured with a few turns of Kerlix roll gauze/paper tape.  No tape is to be used on the patient's skin.  Addendum:  A transmetatarsal amputation is planned for Wednesday or Thursday of this week by orthopedics.    [place] nursing team will not follow, but will remain available to this patient, the nursing and medical teams.  Please re-consult if needed. Thanks, Maudie Flakes, MSN, RN, [place], Arther Abbott  Pager# [phone]

## 2021-01-17 ENCOUNTER — Encounter (HOSPITAL_COMMUNITY): Payer: Medicare Other

## 2021-01-17 ENCOUNTER — Other Ambulatory Visit: Payer: Self-pay

## 2021-01-17 DIAGNOSIS — M069 Rheumatoid arthritis, unspecified: Secondary | ICD-10-CM

## 2021-01-17 DIAGNOSIS — I10 Essential (primary) hypertension: Secondary | ICD-10-CM

## 2021-01-17 DIAGNOSIS — E43 Unspecified severe protein-calorie malnutrition: Secondary | ICD-10-CM | POA: Insufficient documentation

## 2021-01-17 DIAGNOSIS — Z66 Do not resuscitate: Secondary | ICD-10-CM

## 2021-01-17 DIAGNOSIS — E1165 Type 2 diabetes mellitus with hyperglycemia: Secondary | ICD-10-CM

## 2021-01-17 DIAGNOSIS — N1832 Chronic kidney disease, stage 3b: Secondary | ICD-10-CM

## 2021-01-17 DIAGNOSIS — I480 Paroxysmal atrial fibrillation: Secondary | ICD-10-CM

## 2021-01-17 DIAGNOSIS — I959 Hypotension, unspecified: Secondary | ICD-10-CM

## 2021-01-17 DIAGNOSIS — Z8619 Personal history of other infectious and parasitic diseases: Secondary | ICD-10-CM

## 2021-01-17 LAB — BASIC METABOLIC PANEL
Anion gap: 9 (ref 5–15)
BUN: 40 mg/dL — ABNORMAL HIGH (ref 8–23)
CO2: 26 mmol/L (ref 22–32)
Calcium: 8.2 mg/dL — ABNORMAL LOW (ref 8.9–10.3)
Chloride: 107 mmol/L (ref 98–111)
Creatinine, Ser: 1.14 mg/dL — ABNORMAL HIGH (ref 0.44–1.00)
GFR, Estimated: 47 mL/min — ABNORMAL LOW (ref >60)
Glucose, Bld: 117 mg/dL — ABNORMAL HIGH (ref 70–99)
Potassium: 4.4 mmol/L (ref 3.5–5.1)
Sodium: 142 mmol/L (ref 135–145)

## 2021-01-17 LAB — CBC
HCT: 30.1 % — ABNORMAL LOW (ref 36.0–46.0)
Hemoglobin: 9.9 g/dL — ABNORMAL LOW (ref 12.0–15.0)
MCH: 30.5 pg (ref 26.0–34.0)
MCHC: 32.9 g/dL (ref 30.0–36.0)
MCV: 92.6 fL (ref 80.0–100.0)
Platelets: 244 10*3/uL (ref 150–400)
RBC: 3.25 MIL/uL — ABNORMAL LOW (ref 3.87–5.11)
RDW: 13.2 % (ref 11.5–15.5)
WBC: 7.8 10*3/uL (ref 4.0–10.5)
nRBC: 0 % (ref 0.0–0.2)

## 2021-01-17 LAB — GLUCOSE, CAPILLARY
Glucose-Capillary: 106 mg/dL — ABNORMAL HIGH (ref 70–99)
Glucose-Capillary: 264 mg/dL — ABNORMAL HIGH (ref 70–99)
Glucose-Capillary: 303 mg/dL — ABNORMAL HIGH (ref 70–99)
Glucose-Capillary: 276 mg/dL — ABNORMAL HIGH (ref 70–99)
Glucose-Capillary: 116 mg/dL — ABNORMAL HIGH (ref 70–99)

## 2021-01-17 MED ORDER — ENSURE ENLIVE PO LIQD
237.0000 mL | Freq: Two times a day (BID) | ORAL | Status: DC
  Administered 2021-01-17 – 2021-01-24 (×12): 237 mL via ORAL
  Filled 2021-01-17: qty 237

## 2021-01-17 MED ORDER — CEFAZOLIN SODIUM-DEXTROSE 2-4 GM/100ML-% IV SOLN
2.0000 g | INTRAVENOUS | Status: DC
  Filled 2021-01-17: qty 100

## 2021-01-17 MED ORDER — ENSURE PRE-SURGERY PO LIQD
296.0000 mL | Freq: Once | ORAL | Status: DC
  Filled 2021-01-17: qty 296

## 2021-01-17 MED ORDER — POVIDONE-IODINE 10 % EX SWAB
2.0000 "application " | Freq: Once | CUTANEOUS | Status: AC
  Administered 2021-01-18: 2 via TOPICAL

## 2021-01-17 MED ORDER — SODIUM CHLORIDE 0.9 % IV SOLN
1.0000 g | Freq: Two times a day (BID) | INTRAVENOUS | Status: AC
  Administered 2021-01-17 – 2021-01-19 (×5): 1 g via INTRAVENOUS
  Filled 2021-01-17 (×7): qty 1

## 2021-01-17 MED ORDER — RENA-VITE PO TABS
1.0000 | ORAL_TABLET | Freq: Every day | ORAL | Status: DC
  Administered 2021-01-17 – 2021-01-23 (×7): 1 via ORAL
  Filled 2021-01-17 (×7): qty 1

## 2021-01-17 MED ORDER — CHLORHEXIDINE GLUCONATE 4 % EX LIQD
60.0000 mL | Freq: Once | CUTANEOUS | Status: AC
  Administered 2021-01-18: 4 via TOPICAL
  Filled 2021-01-17: qty 60

## 2021-01-17 NOTE — Progress Notes (Signed)
PROGRESS NOTE  Shelby Walker LMB:[id number] DOB: [birth year]   PCP: Reynold Bowen, MD  Patient is from: Home.  Lives alone.  DOA: 01/16/2021 LOS: 1  Chief complaints:  Right foot wound    Brief Narrative / Interim history: 88 year old F with PMH of DM-2, RA, CKD-3B, PAD, A. fib on Eliquis, HTN and HOH presenting with right foot wound and erythema with drainage, and admitted for diabetic right foot infection.  Previously, she had an ulcer on the bottom of right foot and took antibiotics and it healed; an aortogram on 10/19/20 showed adequate circulation for wound healing at that time.  She was last seen on 11/7 by Dr. Amalia Hailey for R foot edema/erythema and was thought to have gout; she was given an rx for meloxicam.  This time, she noticed a dorsal foot wound that spontaneously appeared on 11/13, and was apparently necrotic at that time.  She went to the ER and was given Doxy and referred to Dr. Doran Durand who saw her 11/14 and sent her for direct admission.     Patient was started on ceftriaxone, vancomycin and Flagyl.  ABI ordered.  Plan for TMT amputation on 11/16.    Subjective: Seen and examined earlier this morning.  No major events overnight of this morning.  Reports pain in right foot.  No other complaints.  She denies chest pain, shortness of breath, GI or UTI symptoms.  Patient's daughter at bedside.  Objective: Vitals:   01/16/21 2001 01/16/21 2328 01/17/21 0349 01/17/21 0717  BP: (!) 153/52 (!) 110/40 (!) 135/51 (!) 137/59  Pulse: 71 70 73 74  Resp: _0 Temp: 98 F (36.7 C) 98.5 F (36.9 C) 98.2 F (36.8 C) 98.3 F (36.8 C)  TempSrc: Oral Oral Oral Oral  SpO2: 98% 99% 99% 98%  Weight:      Height:        Intake/Output Summary (Last 24 hours) at 01/17/2021 1155 Last data filed at 01/17/2021 0956 Gross per 24 hour  Intake 490 ml  Output --  Net 490 ml   Filed Weights   01/16/21 1603  Weight: 54.5 kg    Examination:  GENERAL: No apparent distress.   Nontoxic. HEENT: MMM.  Vision and hearing grossly intact.  NECK: Supple.  No apparent JVD.  RESP: On RA.  No IWOB.  Fair aeration bilaterally. CVS:  RRR. Heart sounds normal.  ABD/GI/GU: BS+. Abd soft, NTND.  MSK/EXT:  Moves extremities.  Significant deformity in her right foot from RA.  Faint DP pulse SKIN: Ulceration with foul-smelling purulent drainage at first MTP joint with surrounding erythema, swelling NEURO: Awake, alert and oriented appropriately.  No apparent focal neuro deficit. PSYCH: Calm. Normal affect.   Procedures:  None  Microbiology summarized: FEOFH-21 and influenza PCR nonreactive. Blood culture pending.  Assessment & Plan: Diabetic right foot infection-seems to have failed outpatient therapy with p.o. antibiotics.  Exam with ulceration, foul-smelling purulent drainage about first right MTP joint with surrounding erythema swelling and tenderness.  Prior x-ray from 11/7 without osteo per podiatry, Dr. Amalia Hailey. Aortogram on 10/19/20 showed adequate circulation for wound healing at that time.  CRP 9.0.  ESR 50. -Agree with ABI to exclude new significant PAD. -Continue vancomycin and Flagyl.   -Change ceftriaxone to cefepime for pseudomonal coverage.  -Follow blood cultures -Plan for TMT amputation on 11/16.  -Continue holding Eliquis. -Appreciate help by WOCN  History of recurrent C diff infection-hopefully she does not need prolonged antibiotics if source of infection  is surgically removed.  CKD-3B/azotemia: Relatively stable. Recent Labs    07/11/20 1147 10/06/20 1216 01/16/21 1642 01/17/21 0520  BUN 49* 44* 44* 40*  CREATININE 1.18* 1.26* 1.25* 1.14*  -Continue monitoring  Mild hyperkalemia: Resolved.  Anemia of chronic disease: H&H stable. Recent Labs    07/11/20 1147 10/06/20 1216 01/16/21 1642 01/17/21 0520  HGB 10.4* 10.0* 10.6* 9.9*  -Continue monitoring   IDDM-2 with hyperglycemia, hyperlipidemia, neuropathy and diabetic foot ulcer Recent  Labs  Lab 01/16/21 2136 01/17/21 0810  GLUCAP 106* 106*  -Discontinue basal insulin -Continue SSI-sensitive -Continue statin and Neurontin.   Paroxysmal A. fib: Rate controlled.  On Coreg and Eliquis at home -Resume home [place] for surgery   Hypotension/essential HTN: Hypotension resolved.  Normotensive. -Resume home Coreg -Consider resumption of Coreg tomorrow   RA: Previously on Enbrel but now receiving monthly infusions of another DMARD; due for next infusion on Monday, 11/21 -Continue home prednisone 4 mg daily -Will watch for adrenal insufficiency from chronic steroid use   Goal of care counseling: DNR/DNI which is appropriate.   Increased need of nutrition Body mass index is 22.7 kg/m.     Interventions: MVI, Ensure Enlive (each supplement provides 350kcal and 20 grams of protein), Liberalize Diet   DVT prophylaxis:  enoxaparin (LOVENOX) injection 30 mg Start: 01/16/21 2200  Code Status: DNR/DNI Family Communication: Updated patient's daughter at bedside. Level of care: Med-Surg Status is: Inpatient  Remains inpatient appropriate because: Need for IV antibiotics and surgical intervention for diabetic foot infection,    Consultants:  Orthopedic surgery   Sch Meds:  Scheduled Meds:  atorvastatin  20 mg Oral QHS   carvedilol  3.125 mg Oral BID WC   cycloSPORINE  1 drop Both Eyes BID   docusate sodium  100 mg Oral BID   enoxaparin (LOVENOX) injection  30 mg Subcutaneous Q24H   insulin aspart  0-5 Units Subcutaneous QHS   insulin aspart  0-9 Units Subcutaneous TID WC   pantoprazole  40 mg Oral Daily   predniSONE  4 mg Oral q AM   saccharomyces boulardii  250 mg Oral BID   Continuous Infusions:  sodium chloride 50 mL/hr at 01/16/21 1834   ceFEPime (MAXIPIME) IV     metronidazole 500 mg (01/17/21 0524)   [START ON 01/18/2021] vancomycin     PRN Meds:.acetaminophen **OR** acetaminophen, acetaminophen-codeine, bisacodyl, gabapentin,  hydrALAZINE, morphine injection, ondansetron **OR** ondansetron (ZOFRAN) IV, oxyCODONE, polyethylene glycol, traMADol  Antimicrobials: Anti-infectives (From admission, onward)    Start     Dose/Rate Route Frequency Ordered Stop   01/18/21 1700  vancomycin (VANCOREADY) IVPB 1000 mg/200 mL        1,000 mg 200 mL/hr over 60 Minutes Intravenous Every 48 hours 01/16/21 1804     01/17/21 1100  ceFEPIme (MAXIPIME) 1 g in sodium chloride 0.9 % 100 mL IVPB        1 g 200 mL/hr over 30 Minutes Intravenous Every 12 hours 01/17/21 1011     01/16/21 1700  cefTRIAXone (ROCEPHIN) 2 g in sodium chloride 0.9 % 100 mL IVPB  Status:  Discontinued        2 g 200 mL/hr over 30 Minutes Intravenous Every 24 hours 01/16/21 1607 01/17/21 0953   01/16/21 1700  metroNIDAZOLE (FLAGYL) IVPB 500 mg        500 mg 100 mL/hr over 60 Minutes Intravenous Every 8 hours 01/16/21 1607 01/23/21 2159   01/16/21 1700  vancomycin (VANCOREADY) IVPB 1250 mg/250 mL  1,250 mg 166.7 mL/hr over 90 Minutes Intravenous  Once 01/16/21 1614 01/16/21 2324        I have personally reviewed the following labs and images: CBC: Recent Labs  Lab 01/16/21 1642 01/17/21 0520  WBC 10.1 7.8  NEUTROABS 8.1*  --   HGB 10.6* 9.9*  HCT 32.4* 30.1*  MCV 93.1 92.6  PLT 269 244   BMP &GFR Recent Labs  Lab 01/16/21 1642 01/17/21 0520  NA 139 142  K 5.2* 4.4  CL 103 107  CO2 27 26  GLUCOSE 321* 117*  BUN 44* 40*  CREATININE 1.25* 1.14*  CALCIUM 8.8* 8.2*   Estimated Creatinine Clearance: 27.2 mL/min (A) (by C-G formula based on SCr of 1.14 mg/dL (H)). Liver & Pancreas: Recent Labs  Lab 01/16/21 1642  AST 17  ALT 18  ALKPHOS 55  BILITOT 0.6  PROT 6.0*  ALBUMIN 2.9*   No results for input(s): LIPASE, AMYLASE in the last 168 hours. No results for input(s): AMMONIA in the last 168 hours. Diabetic: No results for input(s): HGBA1C in the last 72 hours. Recent Labs  Lab 01/16/21 2136 01/17/21 0810  GLUCAP 106* 106*    Cardiac Enzymes: No results for input(s): CKTOTAL, CKMB, CKMBINDEX, TROPONINI in the last 168 hours. No results for input(s): PROBNP in the last 8760 hours. Coagulation Profile: No results for input(s): INR, PROTIME in the last 168 hours. Thyroid Function Tests: No results for input(s): TSH, T4TOTAL, FREET4, T3FREE, THYROIDAB in the last 72 hours. Lipid Profile: No results for input(s): CHOL, HDL, LDLCALC, TRIG, CHOLHDL, LDLDIRECT in the last 72 hours. Anemia Panel: No results for input(s): VITAMINB12, FOLATE, FERRITIN, TIBC, IRON, RETICCTPCT in the last 72 hours. Urine analysis:    Component Value Date/Time   COLORURINE YELLOW 07/31/2018 0516   APPEARANCEUR HAZY (A) 07/31/2018 0516   LABSPEC 1.011 07/31/2018 0516   PHURINE 5.0 07/31/2018 0516   GLUCOSEU NEGATIVE 07/31/2018 0516   HGBUR SMALL (A) 07/31/2018 0516   BILIRUBINUR NEGATIVE 07/31/2018 0516   KETONESUR NEGATIVE 07/31/2018 0516   PROTEINUR NEGATIVE 07/31/2018 0516   NITRITE NEGATIVE 07/31/2018 0516   LEUKOCYTESUR NEGATIVE 07/31/2018 0516   Sepsis Labs: Invalid input(s): PROCALCITONIN, [place]  Microbiology: Recent Results (from the past 240 hour(s))  Resp Panel by RT-PCR (Flu A&B, Covid) Nasopharyngeal Swab     Status: None   Collection Time: 01/16/21  5:12 PM   Specimen: Nasopharyngeal Swab; Nasopharyngeal(NP) swabs in vial transport medium  Result Value Ref Range Status   SARS Coronavirus 2 by RT PCR NEGATIVE NEGATIVE Final    Comment: (NOTE) SARS-CoV-2 target nucleic acids are NOT DETECTED.  The SARS-CoV-2 RNA is generally detectable in upper respiratory specimens during the acute phase of infection. The lowest concentration of SARS-CoV-2 viral copies this assay can detect is 138 copies/mL. A negative result does not preclude SARS-Cov-2 infection and should not be used as the sole basis for treatment or other patient management decisions. A negative result may occur with  improper specimen  collection/handling, submission of specimen other than nasopharyngeal swab, presence of viral mutation(s) within the areas targeted by this assay, and inadequate number of viral copies(<138 copies/mL). A negative result must be combined with clinical observations, patient history, and epidemiological information. The expected result is Negative.  Fact Sheet for Patients:  EntrepreneurPulse.com.au  Fact Sheet for Healthcare Providers:  IncredibleEmployment.be  This test is no t yet approved or cleared by the [place] FDA and  has been authorized for detection and/or diagnosis of SARS-CoV-2  by FDA under an Emergency Use Authorization (EUA). This EUA will remain  in effect (meaning this test can be used) for the duration of the COVID-19 declaration under Section 564(b)(1) of the Act, 21 U.S.C.section 360bbb-3(b)(1), unless the authorization is terminated  or revoked sooner.       Influenza A by PCR NEGATIVE NEGATIVE Final   Influenza B by PCR NEGATIVE NEGATIVE Final    Comment: (NOTE) The Xpert Xpress SARS-CoV-2/FLU/RSV plus assay is intended as an aid in the diagnosis of influenza from Nasopharyngeal swab specimens and should not be used as a sole basis for treatment. Nasal washings and aspirates are unacceptable for Xpert Xpress SARS-CoV-2/FLU/RSV testing.  Fact Sheet for Patients: EntrepreneurPulse.com.au  Fact Sheet for Healthcare Providers: IncredibleEmployment.be  This test is not yet approved or cleared by the [place] FDA and has been authorized for detection and/or diagnosis of SARS-CoV-2 by FDA under an Emergency Use Authorization (EUA). This EUA will remain in effect (meaning this test can be used) for the duration of the COVID-19 declaration under Section 564(b)(1) of the Act, 21 U.S.C. section 360bbb-3(b)(1), unless the authorization is terminated or revoked.  Performed at [facility], [place] [address], [place], [place] [address]     Radiology Studies: No results found.     T. Stewart  If 7PM-7AM, please contact night-coverage [email or web address] 01/17/2021, 11:55 AM

## 2021-01-17 NOTE — Progress Notes (Signed)
Patient arrived to 5N16 from 5W01. Report received from [place], [place]. Agree with previous RN assessment. Family at bedside with patient. Patient and family oriented to unit and call system, verbalized understanding. Patient resting comfortably in bed, personal items and call light placed with in reach. No issues or concerns voiced at this time. POC updated.

## 2021-01-17 NOTE — Consult Note (Addendum)
Reason for Consult:Right foot gangrene Referring Physician: Wendee Beavers Time called: [id number] Time at bedside: 0917   Shelby Walker is an 87 y.o. female.  HPI: Shelby Walker has been dealing with right foot pain and swelling for 2-3 months. She has been seeing podiatry for it. She developed an ulceration on Saturday which prompted further evaluation. She saw Dr. Doran Durand yesterday in the office who recommended TMT amputation and she was admitted.  Past Medical History:  Diagnosis Date   Bruit    Carotid Doppler showed no significant abnormality     9per patient)   C. difficile colitis 07/2018   with severe sepsis   Chest pain, unspecified    Nuclear, May, 2008, no scar or ischemia   Diabetes mellitus    Diverticulosis    Dyslipidemia    Ejection fraction    EF 55-60%, echo, February, 2011 // Echocardiogram 8/21: EF 60-65, no RWMA, Gr 1 DD, GLS -14%, normal RVSF, mild LAE, trivial MR, mild MS (mean 4 mmHg), RVSP 23.4    GERD (gastroesophageal reflux disease)    Mitral regurgitation April 21, 2009   mild,  echo, February, 2011   Osteoporosis    Palpitations    possible very brief atrial fibrillation on monitor and possible reentrant tachycardia   Psoriasis    Rheumatoid arthritis [facility])     Past Surgical History:  Procedure Laterality Date   ABDOMINAL AORTOGRAM W/LOWER EXTREMITY Right 10/19/2020   Procedure: ABDOMINAL AORTOGRAM W/LOWER EXTREMITY;  Surgeon: Wellington Hampshire, MD;  Location: [facility];  Service: Cardiovascular;  Laterality: Right;   FRACTURE SURGERY      Family History  Problem Relation Age of Onset   Heart failure Father    Heart attack Father    Diabetes Father    Heart attack Mother    Heart failure Mother    Diabetes Mother    Stroke Sister     Social History:  reports that she has never smoked. She has never used smokeless tobacco. She reports that she does not drink alcohol and does not use drugs.  Allergies:  Allergies  Allergen Reactions    Cholestyramine     Possible- Makes throat burn. Patient said she can't take it    Compazine [Prochlorperazine Edisylate] Swelling    REACTION: " tongue swell and unable to swallow"   Sulfonamide Derivatives Other (See Comments)    REACTION: " broke out with fine itching bumps"   Hydrocodone Other (See Comments)   Infliximab     Other reaction(s): rash   Prochlorperazine     Other reaction(s): Unknown   Rosuvastatin     Other reaction(s): muscle aches   Sulfa Antibiotics     Other reaction(s): tongue swelling    Medications: I have reviewed the patient's current medications.  Results for orders placed or performed during the hospital encounter of 01/16/21 (from the past 48 hour(s))  Sedimentation rate     Status: Abnormal   Collection Time: 01/16/21  4:42 PM  Result Value Ref Range   Sed Rate 50 (H) 0 - 22 mm/hr    Comment: Performed at [facility], [address], [place], [place] [address]  C-reactive protein     Status: Abnormal   Collection Time: 01/16/21  4:42 PM  Result Value Ref Range   CRP 9.0 (H) <1.0 mg/dL    Comment: Performed at [facility], [place] [address], [place], [place] [address]  Prealbumin     Status: Abnormal  Collection Time: 01/16/21  4:42 PM  Result Value Ref Range   Prealbumin 15.2 (L) 18 - 38 mg/dL    Comment: Performed at [facility], [place] [address] [place], [place] [address]  Lactic acid     Status: None   Collection Time: 01/16/21  4:42 PM  Result Value Ref Range   Lactic Acid, Venous 1.3 0.5 - 1.9 mmol/L    Comment: Performed at [facility] [address], [place], [place] [address]  Comprehensive metabolic panel     Status: Abnormal   Collection Time: 01/16/21  4:42 PM  Result Value Ref Range   Sodium 139 135 - 145 mmol/L   Potassium 5.2 (H) 3.5 - 5.1 mmol/L   Chloride 103 98 - 111 mmol/L   CO2 27 22 - 32 mmol/L   Glucose, Bld 321 (H) 70 - 99 mg/dL    Comment: Glucose reference range applies only to samples  taken after fasting for at least 8 hours.   BUN 44 (H) 8 - 23 mg/dL   Creatinine, Ser 1.25 (H) 0.44 - 1.00 mg/dL   Calcium 8.8 (L) 8.9 - 10.3 mg/dL   Total Protein 6.0 (L) 6.5 - 8.1 g/dL   Albumin 2.9 (L) 3.5 - 5.0 g/dL   AST 17 15 - 41 U/L   ALT 18 0 - 44 U/L   Alkaline Phosphatase 55 38 - 126 U/L   Total Bilirubin 0.6 0.3 - 1.2 mg/dL   GFR, Estimated 42 (L) >60 mL/min    Comment: (NOTE) Calculated using the CKD-EPI Creatinine Equation (2021)    Anion gap 9 5 - 15    Comment: Performed at [facility], [place] [address] [place], [place] [address]  CBC WITH DIFFERENTIAL     Status: Abnormal   Collection Time: 01/16/21  4:42 PM  Result Value Ref Range   WBC 10.1 4.0 - 10.5 K/uL   RBC 3.48 (L) 3.87 - 5.11 MIL/uL   Hemoglobin 10.6 (L) 12.0 - 15.0 g/dL   HCT 32.4 (L) 36.0 - 46.0 %   MCV 93.1 80.0 - 100.0 fL   MCH 30.5 26.0 - 34.0 pg   MCHC 32.7 30.0 - 36.0 g/dL   RDW 13.3 11.5 - 15.5 %   Platelets 269 150 - 400 K/uL   nRBC 0.0 0.0 - 0.2 %   Neutrophils Relative % 80 %   Neutro Abs 8.1 (H) 1.7 - 7.7 K/uL   Lymphocytes Relative 14 %   Lymphs Abs 1.4 0.7 - 4.0 K/uL   Monocytes Relative 5 %   Monocytes Absolute 0.5 0.1 - 1.0 K/uL   Eosinophils Relative 1 %   Eosinophils Absolute 0.1 0.0 - 0.5 K/uL   Basophils Relative 0 %   Basophils Absolute 0.0 0.0 - 0.1 K/uL   Immature Granulocytes 0 %   Abs Immature Granulocytes 0.03 0.00 - 0.07 K/uL    Comment: Performed at [facility] [address], [place], [place] [address]  Resp Panel by RT-PCR (Flu A&B, Covid) Nasopharyngeal Swab     Status: None   Collection Time: 01/16/21  5:12 PM   Specimen: Nasopharyngeal Swab; Nasopharyngeal(NP) swabs in vial transport medium  Result Value Ref Range   SARS Coronavirus 2 by RT PCR NEGATIVE NEGATIVE    Comment: (NOTE) SARS-CoV-2 target nucleic acids are NOT DETECTED.  The SARS-CoV-2 RNA is generally detectable in upper respiratory specimens during the acute phase of infection. The  lowest concentration of SARS-CoV-2  viral copies this assay can detect is 138 copies/mL. A negative result does not preclude SARS-Cov-2 infection and should not be used as the sole basis for treatment or other patient management decisions. A negative result may occur with  improper specimen collection/handling, submission of specimen other than nasopharyngeal swab, presence of viral mutation(s) within the areas targeted by this assay, and inadequate number of viral copies(<138 copies/mL). A negative result must be combined with clinical observations, patient history, and epidemiological information. The expected result is Negative.  Fact Sheet for Patients:  EntrepreneurPulse.com.au  Fact Sheet for Healthcare Providers:  IncredibleEmployment.be  This test is no t yet approved or cleared by the [place] FDA and  has been authorized for detection and/or diagnosis of SARS-CoV-2 by FDA under an Emergency Use Authorization (EUA). This EUA will remain  in effect (meaning this test can be used) for the duration of the COVID-19 declaration under Section 564(b)(1) of the Act, 21 U.S.C.section 360bbb-3(b)(1), unless the authorization is terminated  or revoked sooner.       Influenza A by PCR NEGATIVE NEGATIVE   Influenza B by PCR NEGATIVE NEGATIVE    Comment: (NOTE) The Xpert Xpress SARS-CoV-2/FLU/RSV plus assay is intended as an aid in the diagnosis of influenza from Nasopharyngeal swab specimens and should not be used as a sole basis for treatment. Nasal washings and aspirates are unacceptable for Xpert Xpress SARS-CoV-2/FLU/RSV testing.  Fact Sheet for Patients: EntrepreneurPulse.com.au  Fact Sheet for Healthcare Providers: IncredibleEmployment.be  This test is not yet approved or cleared by the [place] FDA and has been authorized for detection and/or diagnosis of SARS-CoV-2 by FDA under an Emergency  Use Authorization (EUA). This EUA will remain in effect (meaning this test can be used) for the duration of the COVID-19 declaration under Section 564(b)(1) of the Act, 21 U.S.C. section 360bbb-3(b)(1), unless the authorization is terminated or revoked.  Performed at [facility], [place] [address] [place], [place] [address]   Glucose, capillary     Status: Abnormal   Collection Time: 01/16/21  9:36 PM  Result Value Ref Range   Glucose-Capillary 106 (H) 70 - 99 mg/dL    Comment: Glucose reference range applies only to samples taken after fasting for at least 8 hours.  Basic metabolic panel     Status: Abnormal   Collection Time: 01/17/21  5:20 AM  Result Value Ref Range   Sodium 142 135 - 145 mmol/L   Potassium 4.4 3.5 - 5.1 mmol/L   Chloride 107 98 - 111 mmol/L   CO2 26 22 - 32 mmol/L   Glucose, Bld 117 (H) 70 - 99 mg/dL    Comment: Glucose reference range applies only to samples taken after fasting for at least 8 hours.   BUN 40 (H) 8 - 23 mg/dL   Creatinine, Ser 1.14 (H) 0.44 - 1.00 mg/dL   Calcium 8.2 (L) 8.9 - 10.3 mg/dL   GFR, Estimated 47 (L) >60 mL/min    Comment: (NOTE) Calculated using the CKD-EPI Creatinine Equation (2021)    Anion gap 9 5 - 15    Comment: Performed at [facility] [address], [place], [place] [address]  CBC     Status: Abnormal   Collection Time: 01/17/21  5:20 AM  Result Value Ref Range   WBC 7.8 4.0 - 10.5 K/uL   RBC 3.25 (L) 3.87 - 5.11 MIL/uL   Hemoglobin 9.9 (L) 12.0 - 15.0 g/dL   HCT 30.1 (L) 36.0 -  46.0 %   MCV 92.6 80.0 - 100.0 fL   MCH 30.5 26.0 - 34.0 pg   MCHC 32.9 30.0 - 36.0 g/dL   RDW 13.2 11.5 - 15.5 %   Platelets 244 150 - 400 K/uL   nRBC 0.0 0.0 - 0.2 %    Comment: Performed at [facility], [place] [address] [place], [place] [address]  Glucose, capillary     Status: Abnormal   Collection Time: 01/17/21  8:10 AM  Result Value Ref Range   Glucose-Capillary 106 (H) 70 - 99 mg/dL    Comment: Glucose  reference range applies only to samples taken after fasting for at least 8 hours.    No results found.  Review of Systems  Constitutional:  Negative for chills, diaphoresis and fever.  HENT:  Negative for ear discharge, ear pain, hearing loss and tinnitus.   Eyes:  Negative for photophobia and pain.  Respiratory:  Negative for cough and shortness of breath.   Cardiovascular:  Negative for chest pain.  Gastrointestinal:  Negative for abdominal pain, nausea and vomiting.  Genitourinary:  Negative for dysuria, flank pain, frequency and urgency.  Musculoskeletal:  Positive for arthralgias (Right foot). Negative for back pain, myalgias and neck pain.  Neurological:  Negative for dizziness and headaches.  Hematological:  Does not bruise/bleed easily.  Psychiatric/Behavioral:  The patient is not nervous/anxious.   Blood pressure (!) 137/59, pulse 74, temperature 98.3 F (36.8 C), temperature source Oral, resp. rate 16, height 5\' 1"  (1.549 m), weight 54.5 kg, SpO2 98 %. Physical Exam Constitutional:      General: She is not in acute distress.    Appearance: She is well-developed. She is not diaphoretic.  HENT:     Head: Normocephalic and atraumatic.  Eyes:     General: No scleral icterus.       Right eye: No discharge.        Left eye: No discharge.     Conjunctiva/sclera: Conjunctivae normal.  Cardiovascular:     Rate and Rhythm: Normal rate and regular rhythm.  Pulmonary:     Effort: Pulmonary effort is normal. No respiratory distress.  Musculoskeletal:     Cervical back: Normal range of motion.  Feet:     Comments: Right foot: Necrotic ulceration over 1st MTP. 2+ DP, 0 PT. 2+ NP edema. SPN/DPN/TN intact. Skin:    General: Skin is warm and dry.  Neurological:     Mental Status: She is alert.  Psychiatric:        Mood and Affect: Mood normal.        Behavior: Behavior normal.    Assessment/Plan: Right foot ulcer -- Plan TMT amputation tomorrow with Dr. Doran Durand. Please keep NPO  after MN.    Lisette Abu, PA-C Orthopedic Surgery [phone] 01/17/2021, 9:30 AM   Addendum:  87 y/o female with PMH of RA (Orencia and prednisone), afib (Eliquis) and diabetes c/o swelling and redness of the right dorsal foot for several weeks.  She has had multiple rounds of abx by Dr. Amalia Hailey at [facility].  She developed an area of necrosis over the weekend and presented to my office yesterday with gross purulent drainage and frank necrosis of the dorsal midfoot.  I spoke with her daughter and son at that visit as well.  We discussed the risks and benefits of attempted I and D and the resultant necessity for prolonged wound care, wound vac, flap coverage, etc vs. Transmetatarsal amputation with closure of the wound  through an area of relatively healthy skin.  She and her family understand the risks and benefits of the alternative treatment options and would like to proceed with transmet amputation.  Surgery scheduled for tomorrow afternoon.  Pls hold blood thinners.  NPO after midnight.

## 2021-01-17 NOTE — Progress Notes (Signed)
Initial Nutrition Assessment  DOCUMENTATION CODES:   Severe malnutrition in context of chronic illness  INTERVENTION:   Renal Multivitamin w/ minerals daily  Ensure Enlive po BID, each supplement provides 350 kcal and 20 grams of protein  Chopped meat with all meals.   Recommend liberalizing pt diet to regular. Received ok from MD.  Encourage good PO intake  Provide pt with Wound Healing handout.  NUTRITION DIAGNOSIS:   Severe Malnutrition related to chronic illness (IBS) as evidenced by severe muscle depletion, moderate fat depletion, energy intake < or equal to 75% for > or equal to 1 month.  GOAL:   Patient will meet greater than or equal to 90% of their needs  MONITOR:   PO intake, Supplement acceptance, Weight trends, Skin  REASON FOR ASSESSMENT:   Consult Wound healing  ASSESSMENT:   87 y.o. female presented to the ED with a diabetic foot ulcer. PMH includes DM, CKD IIIa, HTN, GERD, diverticulitis, and reoccurring C. Diff colitis. Pt admitted with diabetic foot ulcer w/ necrosis and possible amputation.   Pt resting in bed, with family at bedside. All parties provide information related to nutrition history.   Pt reports that she has had a poor appetite since 2020 when she was sick with C. Diff and then the passing of her husband in 2021. Overall, her appetite has not returned to normal since these events occurred. Pt reports that she use to love toss salad and slaw, but can no longer eat them because it causes GI symptoms similar to when she had C. Diff. Pt reports a typical intake of:  Breakfast: eggs and bacon or cereal, or muffin Lunch: "sometimes skips" or chicken salad w/ crackers Dinner: daughter and daughter in law brings her meals  Daughter reports that she does not eat a whole lot and that she will usually drink 1/2 a Premier Protein shake with breakfast. Pt wears dentures and that some foods gets stuck to them. Pt also reports that she has a knot in her  throat and has been evaluated by 3 different doctors and they all do not know what it is, but no one thinks that it is cancerous. Pt reports that on occasionally she will have the food stuck in the back of her throat and she will have to spit it all up. Pt states that she just chews her food very well now and is ok.   Per family, pt weighed 124# last week at the doctors, but has lost 10-13# within the past 6 months to a year. Per EMR, pt has had a 6% weight loss in 8 months.  Pt lives alone; was using a cane up until her foot begin to bother her a few months ago. Pt switched to use a walker at home and a cane when outside the home.   Discussed ONS with pt, pt agree able to Ensure Enlive. PLDN ordered chopped meats to assist pt with chewing.   Family very engaged in visit and asked appropriate questions. Provided family and pt with wound healing handout; information about ONS after discharge.   Planned OR tomorrow for transmetatarsal amputation tomorrow at 5 pm.   Medications reviewed and include: Colace, SSI 0-5 units daily + 0--9 units TID, Semglee 6 units daily, Protonix, Prednisone, Florastor, IV antibiotics  Labs reviewed: BUN 40, Creatinine 1.14  NUTRITION - FOCUSED PHYSICAL EXAM:  Flowsheet Row Most Recent Value  Orbital Region Moderate depletion  Upper Arm Region Mild depletion  Thoracic and Lumbar Region Mild  depletion  Buccal Region Moderate depletion  Temple Region Moderate depletion  Clavicle Bone Region Mild depletion  Clavicle and Acromion Bone Region Mild depletion  Scapular Bone Region Mild depletion  Dorsal Hand Severe depletion  Patellar Region Severe depletion  Anterior Thigh Region Severe depletion  Posterior Calf Region Severe depletion  Edema (RD Assessment) None  Hair Reviewed  Eyes Reviewed  Mouth Reviewed  Skin Reviewed  Nails Reviewed       Diet Order:   Diet Order             Diet regular Room service appropriate? Yes; Fluid consistency: Thin  Diet  effective now                   EDUCATION NEEDS:   Education needs have been addressed  Skin:  Skin Assessment: Skin Integrity Issues: Skin Integrity Issues:: Diabetic Ulcer Diabetic Ulcer: R Foot  Last BM:  01/14/2021  Height:   Ht Readings from Last 1 Encounters:  01/16/21 5\' 1"  (1.549 m)    Weight:   Wt Readings from Last 1 Encounters:  01/16/21 54.5 kg    Ideal Body Weight:  47.7 kg  BMI:  Body mass index is 22.7 kg/m.  Estimated Nutritional Needs:   Kcal:  1600-1800  Protein:  80-95 grams  Fluid:  > 1.6 L    Aeryn Medici BS, PLDN Clinical Dietitian See AMiON for contact information.

## 2021-01-17 NOTE — Discharge Instructions (Addendum)
[facility] Stay Proper nutrition can help your body recover from illness and injury.   Foods and beverages high in protein, vitamins, and minerals help rebuild muscle loss, promote healing, & reduce fall risk.   In addition to eating healthy foods, a nutrition shake is an easy, delicious way to get the nutrition you need during and after your hospital stay  It is recommended that you continue to drink 2 bottles per day of:  Ensure Plus for at least 1 month (30 days) after your hospital stay   Tips for adding a nutrition shake into your routine: As allowed, drink one with vitamins or medications instead of water or juice Enjoy one as a tasty mid-morning or afternoon snack Drink cold or make a milkshake out of it Drink one instead of milk with cereal or snacks Use as a coffee creamer   Available at the following grocery stores and pharmacies:           * [facility] [phone]            For COUPONS visit: www.ensure.com/join or http://dawson-may.com/   Suggested Substitutions Ensure Plus = Boost Plus = Carnation Breakfast Essentials = Boost Compact Glucerna Shake = Boost Glucose Control = Carnation Breakfast Essentials SUGAR FREE    Wylene Simmer, MD [facility]  Please read the following information regarding your care after surgery.  Medications  You only need a prescription for the narcotic pain medicine (ex. oxycodone, Percocet, Norco).  All of the other medicines listed below are available over the counter. ? Aleve 2 pills twice a day for the first 3 days after surgery. ? acetominophen (Tylenol) 650 mg every 4-6 hours as you need for minor to moderate pain ? oxycodone as prescribed for severe pain  Narcotic pain medicine (ex. oxycodone, Percocet, Vicodin) will cause constipation.  To prevent  this problem, take the following medicines while you are taking any pain medicine. ? docusate sodium (Colace) 100 mg twice a day ? senna (Senokot) 2 tablets twice a day  ? To help prevent blood clots, resume eliquis after surgery.  You should also get up every hour while you are awake to move around.    Weight Bearing ? Bear weight only on your operated foot in the CAM boot.   Cast / Splint / Dressing ? Keep your splint, cast or dressing clean and dry.  Don't put anything (coat hanger, pencil, etc) down inside of it.  If it gets damp, use a hair dryer on the cool setting to dry it.  If it gets soaked, call the office to schedule an appointment for a cast change.   After your dressing, cast or splint is removed; you may shower, but do not soak or scrub the wound.  Allow the water to run over it, and then gently pat it dry.  Swelling It is normal for you to have swelling where you had surgery.  To reduce swelling and pain, keep your toes above your nose for at least 3 days after surgery.  It may be necessary to keep your foot or leg elevated for several weeks.  If it hurts, it should be elevated.  Follow Up Call my office at  [phone] when you are discharged from the hospital or surgery center to schedule an appointment to be seen two weeks after surgery.  Call my office at [phone] if you develop a fever >101.5 F, nausea, vomiting, bleeding from the surgical site or severe pain.

## 2021-01-17 NOTE — Progress Notes (Signed)
Pharmacy Antibiotic Note  Shelby Walker is a 87 y.o. female admitted on 01/16/2021 with  diabetic foot infection (wound infection) .  Pharmacy has been consulted for cefepime dosing.  Plan: Cefepime 1 gram iv q12h adjusted for renal dosing  Height: 5\' 1"  (154.9 cm) Weight: 54.5 kg (120 lb 2.4 oz) IBW/kg (Calculated) : 47.8  Temp (24hrs), Avg:98.3 F (36.8 C), Min:98 F (36.7 C), Max:98.5 F (36.9 C)  Recent Labs  Lab 01/16/21 1642 01/17/21 0520  WBC 10.1 7.8  CREATININE 1.25* 1.14*  LATICACIDVEN 1.3  --     Estimated Creatinine Clearance: 27.2 mL/min (A) (by C-G formula based on SCr of 1.14 mg/dL (H)).    Allergies  Allergen Reactions   Cholestyramine     Possible- Makes throat burn. Patient said she can't take it    Compazine [Prochlorperazine Edisylate] Swelling    REACTION: " tongue swell and unable to swallow"   Sulfonamide Derivatives Other (See Comments)    REACTION: " broke out with fine itching bumps"   Hydrocodone Other (See Comments)   Infliximab     Other reaction(s): rash   Prochlorperazine     Other reaction(s): Unknown   Rosuvastatin     Other reaction(s): muscle aches   Sulfa Antibiotics     Other reaction(s): tongue swelling    Antimicrobials this admission: Rocephin 11/14 >> 11/15 Metronidazole 11/14 >>  Vancomycin 11/14 >> Cefepime 11/15 >>  Dose adjustments this admission: Cefepime adjusted for renal function to 1 gram iv q12h (normal dose is 2 grams iv q8h in crcl >60 ml/min)  Microbiology results: 11/14 BCx: in progress- no results  Thank you for allowing pharmacy to be a part of this patient's care.  Vaughan Basta BS, PharmD, BCPS Clinical Pharmacist 01/17/2021 10:12 AM

## 2021-01-17 NOTE — Progress Notes (Signed)
Inpatient Diabetes Program Recommendations  AACE/ADA: New Consensus Statement on Inpatient Glycemic Control   Target Ranges:  Prepandial:   less than 140 mg/dL      Peak postprandial:   less than 180 mg/dL (1-2 hours)      Critically ill patients:  140 - 180 mg/dL   Results for Shelby Walker, Shelby Walker (MRN [id number]) as of 01/17/2021 11:08  Ref. Range 01/16/2021 21:36 01/17/2021 7:19 01/17/2021 08:10  Glucose-Capillary Latest Ref Range: 70 - 99 mg/dL 106 (H) 74 mg/dl (from CGM)    106 (H)  Results for Shelby Walker, Shelby Walker (MRN [id number]) as of 01/17/2021 11:08  Ref. Range 01/16/2021 16:42 01/16/2021 18:22  Glucose Latest Ref Range: 70 - 99 mg/dL 321 (H)    Novolog 5 units    Review of Glycemic Control  Diabetes history: DM2 Outpatient Diabetes medications: Tresiba 6-8 units QHS, Novolog 6 units TID with meals Current orders for Inpatient glycemic control: Semglee 6 units QHS, Novolog 0-9 units TID with meals, Novolog 0-5 units QHS; Prednisone 4 mg QAM  Inpatient Diabetes Program Recommendations:    Insulin: Please consider discontinuing Semglee at this time. If glucose becomes consistently over 180 mg/dl with Novolog correction then could reorder low dose Semglee.  NOTE: Noted consult for diabetes coordinator per lower extremity wound order set. Chart reviewed. Initial glucose 321 mg/dl on 01/16/21. Patient received Novolog 5 units at 18:22 on 01/16/21. Per chart, glucose 74 mg/dl today at 7:19 am (from CGM) and patient did NOT receive Semglee last night (charted as patient refused).   Thanks, Barnie Alderman, RN, MSN, CDE Diabetes Coordinator Inpatient Diabetes Program [phone] (Team Pager from 8am to 5pm)

## 2021-01-18 ENCOUNTER — Inpatient Hospital Stay (HOSPITAL_COMMUNITY): Payer: Medicare Other | Admitting: Anesthesiology

## 2021-01-18 ENCOUNTER — Encounter (HOSPITAL_COMMUNITY): Payer: Self-pay | Admitting: Internal Medicine

## 2021-01-18 ENCOUNTER — Encounter (HOSPITAL_COMMUNITY)
Admission: RE | Disposition: A | Discharge status: Still In Hospital | Payer: Self-pay | Source: Ambulatory Visit | Attending: Student

## 2021-01-18 HISTORY — PX: TRANSMETATARSAL AMPUTATION: SHX6197

## 2021-01-18 LAB — RENAL FUNCTION PANEL
Albumin: 2.4 g/dL — ABNORMAL LOW (ref 3.5–5.0)
Anion gap: 10 (ref 5–15)
BUN: 36 mg/dL — ABNORMAL HIGH (ref 8–23)
CO2: 21 mmol/L — ABNORMAL LOW (ref 22–32)
Calcium: 8.1 mg/dL — ABNORMAL LOW (ref 8.9–10.3)
Chloride: 108 mmol/L (ref 98–111)
Creatinine, Ser: 1.3 mg/dL — ABNORMAL HIGH (ref 0.44–1.00)
GFR, Estimated: 40 mL/min — ABNORMAL LOW (ref >60)
Glucose, Bld: 183 mg/dL — ABNORMAL HIGH (ref 70–99)
Phosphorus: 3.7 mg/dL (ref 2.5–4.6)
Potassium: 4.2 mmol/L (ref 3.5–5.1)
Sodium: 139 mmol/L (ref 135–145)

## 2021-01-18 LAB — CBC
HCT: 29.1 % — ABNORMAL LOW (ref 36.0–46.0)
Hemoglobin: 9.3 g/dL — ABNORMAL LOW (ref 12.0–15.0)
MCH: 29.9 pg (ref 26.0–34.0)
MCHC: 32 g/dL (ref 30.0–36.0)
MCV: 93.6 fL (ref 80.0–100.0)
Platelets: 244 10*3/uL (ref 150–400)
RBC: 3.11 MIL/uL — ABNORMAL LOW (ref 3.87–5.11)
RDW: 13.3 % (ref 11.5–15.5)
WBC: 7 10*3/uL (ref 4.0–10.5)
nRBC: 0 % (ref 0.0–0.2)

## 2021-01-18 LAB — FOLATE: Folate: 36.6 ng/mL (ref >5.9)

## 2021-01-18 LAB — IRON AND TIBC
Iron: 71 ug/dL (ref 28–170)
Saturation Ratios: 35 % — ABNORMAL HIGH (ref 10.4–31.8)
TIBC: 200 ug/dL — ABNORMAL LOW (ref 250–450)
UIBC: 129 ug/dL

## 2021-01-18 LAB — LIPID PANEL
Cholesterol: 102 mg/dL (ref 0–200)
HDL: 36 mg/dL — ABNORMAL LOW (ref >40)
LDL Cholesterol: 42 mg/dL (ref 0–99)
Total CHOL/HDL Ratio: 2.8 RATIO
Triglycerides: 121 mg/dL (ref <150)
VLDL: 24 mg/dL (ref 0–40)

## 2021-01-18 LAB — GLUCOSE, CAPILLARY
Glucose-Capillary: 275 mg/dL — ABNORMAL HIGH (ref 70–99)
Glucose-Capillary: 208 mg/dL — ABNORMAL HIGH (ref 70–99)
Glucose-Capillary: 157 mg/dL — ABNORMAL HIGH (ref 70–99)
Glucose-Capillary: 174 mg/dL — ABNORMAL HIGH (ref 70–99)
Glucose-Capillary: 168 mg/dL — ABNORMAL HIGH (ref 70–99)
Glucose-Capillary: 243 mg/dL — ABNORMAL HIGH (ref 70–99)

## 2021-01-18 LAB — HEMOGLOBIN A1C
Hgb A1c MFr Bld: 7.9 % — ABNORMAL HIGH (ref 4.8–5.6)
Mean Plasma Glucose: 180.03 mg/dL

## 2021-01-18 LAB — FERRITIN: Ferritin: 52 ng/mL (ref 11–307)

## 2021-01-18 LAB — RETICULOCYTES
Immature Retic Fract: 12 % (ref 2.3–15.9)
RBC.: 3.05 MIL/uL — ABNORMAL LOW (ref 3.87–5.11)
Retic Count, Absolute: 51.2 10*3/uL (ref 19.0–186.0)
Retic Ct Pct: 1.7 % (ref 0.4–3.1)

## 2021-01-18 LAB — VITAMIN B12: Vitamin B-12: 525 pg/mL (ref 180–914)

## 2021-01-18 LAB — MAGNESIUM: Magnesium: 1.9 mg/dL (ref 1.7–2.4)

## 2021-01-18 IMAGING — US US RENAL
1 series · 14 of 25 positions shown · non-contrast
Comparison: CT dated 07/24/2018

CLINICAL DATA: Renal failure.

EXAM:
RENAL / URINARY TRACT ULTRASOUND COMPLETE

[Series 1: us renal · 14 of 34 slices shown]
[im 1/34]
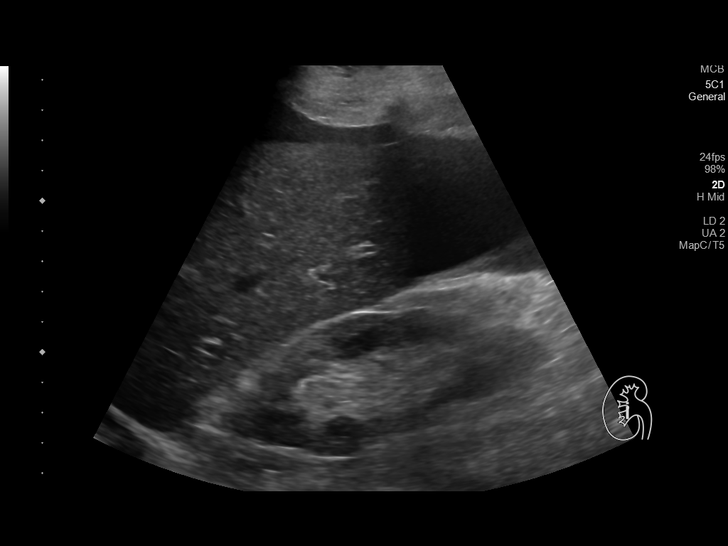
[im 3/34]
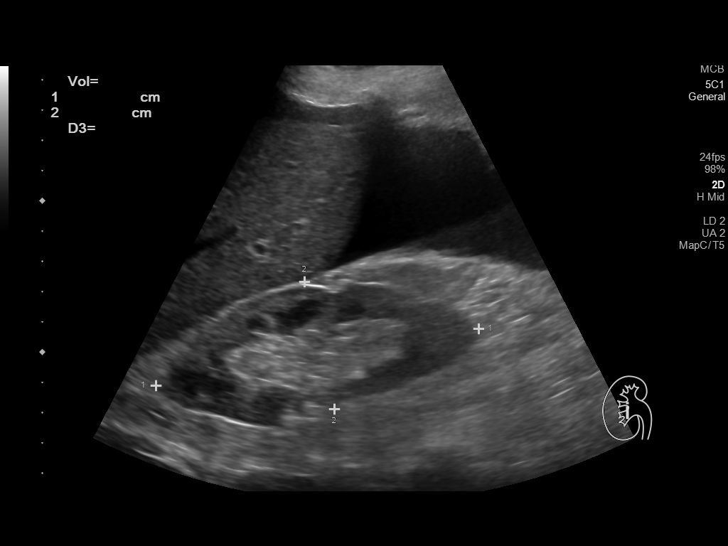
[im 6/34]
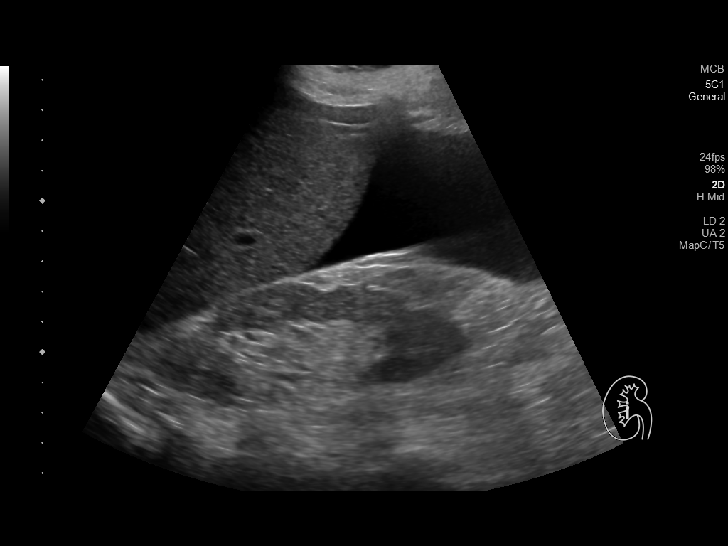
[im 9/34]
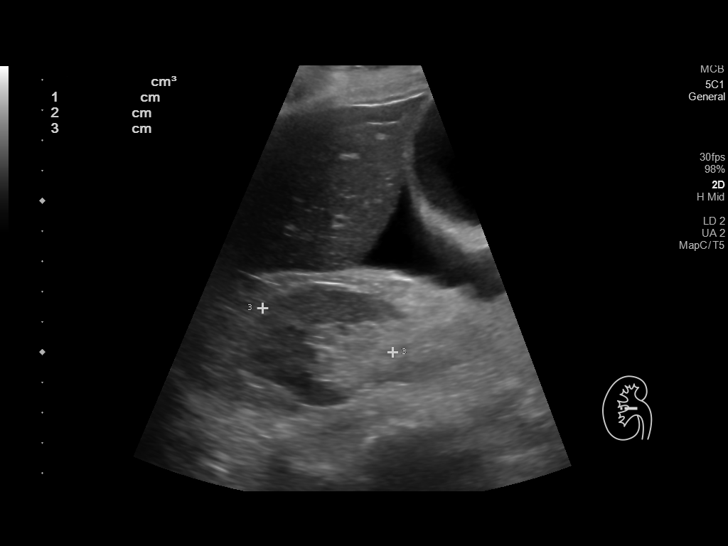
[im 12/34]
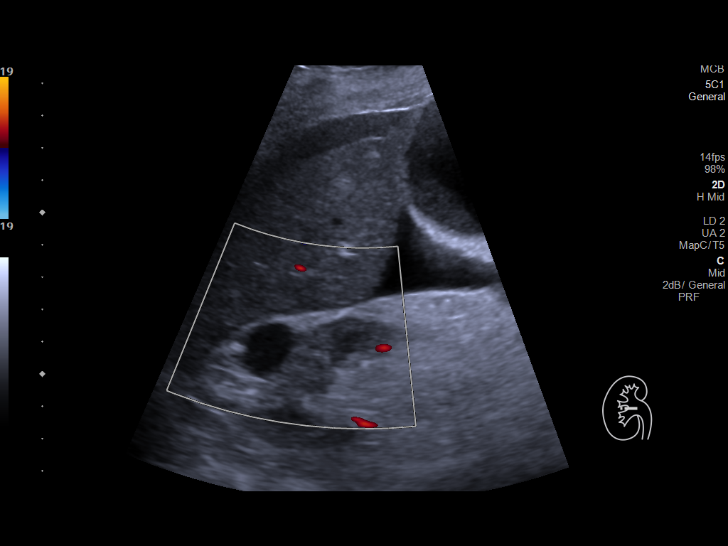
[im 13/34]
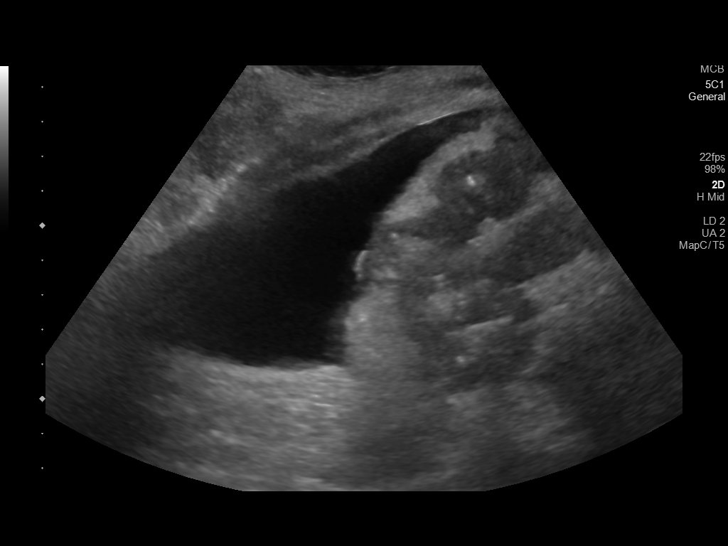
[im 16/34]
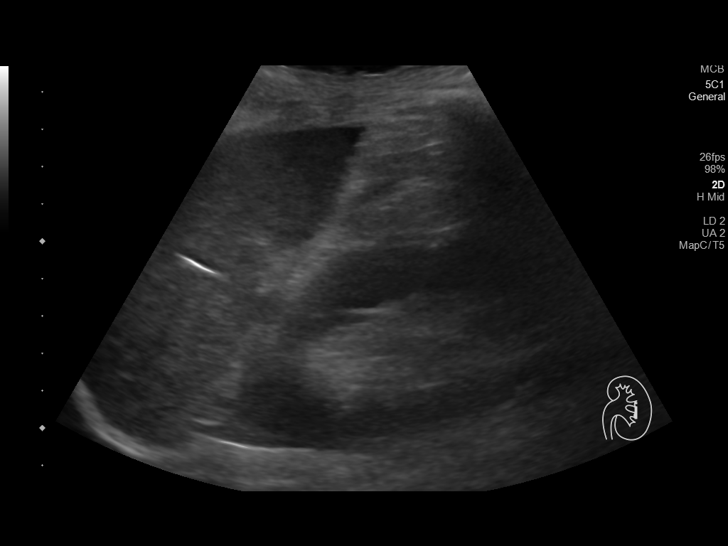
[im 18/34]
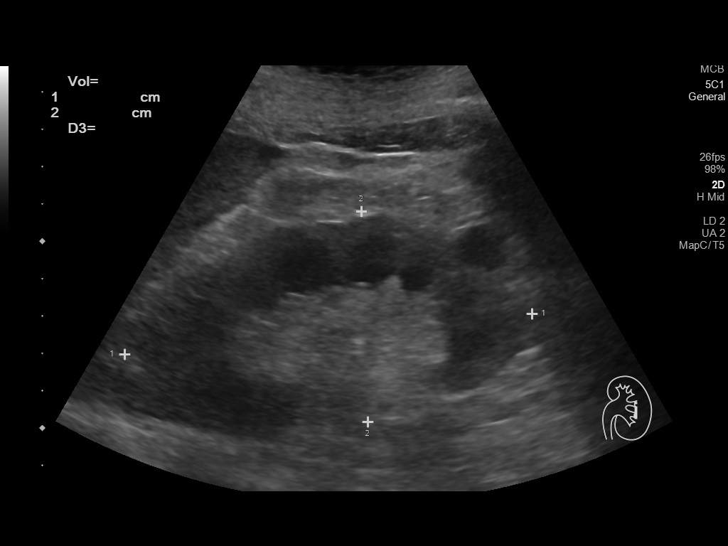
[im 21/34]
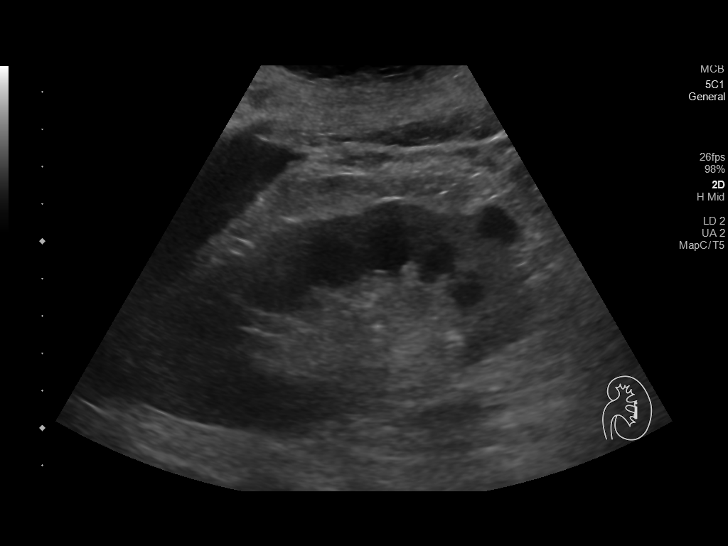
[im 23/34]
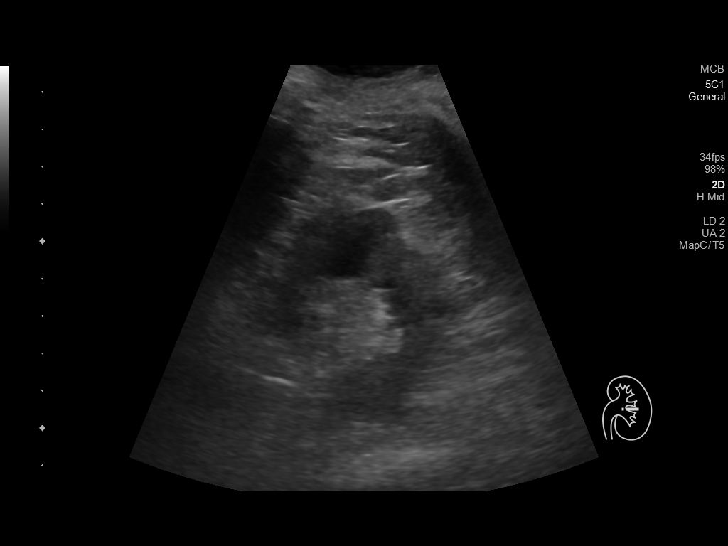
[im 25/34]
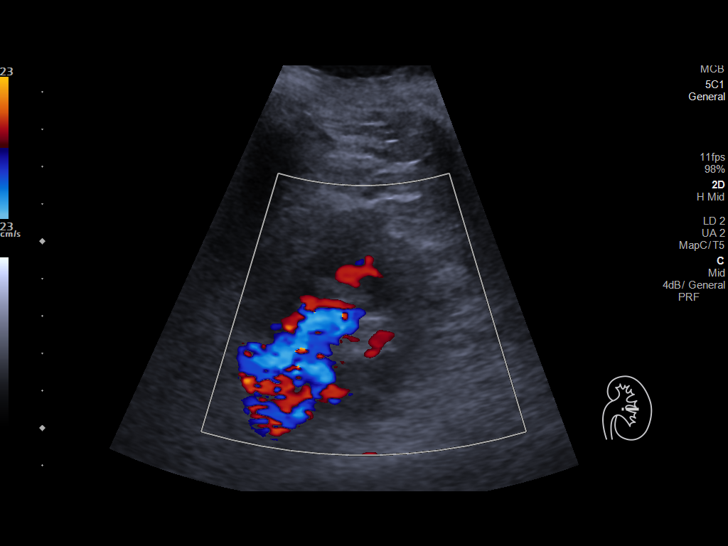
[im 28/34]
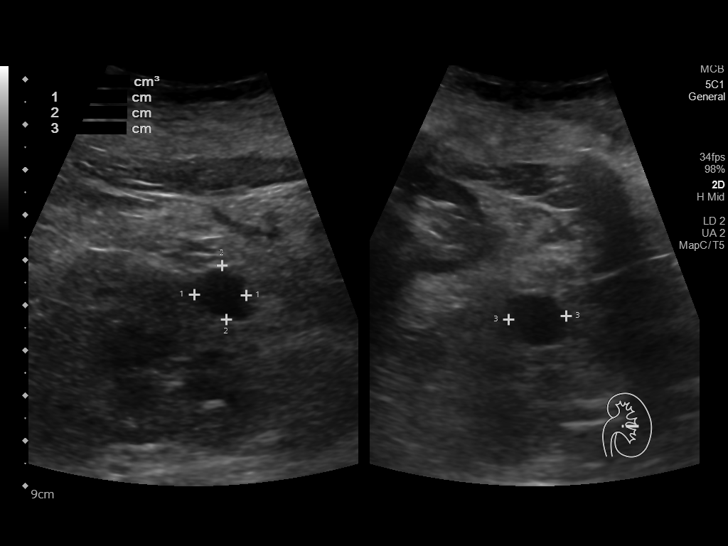
[im 31/34]
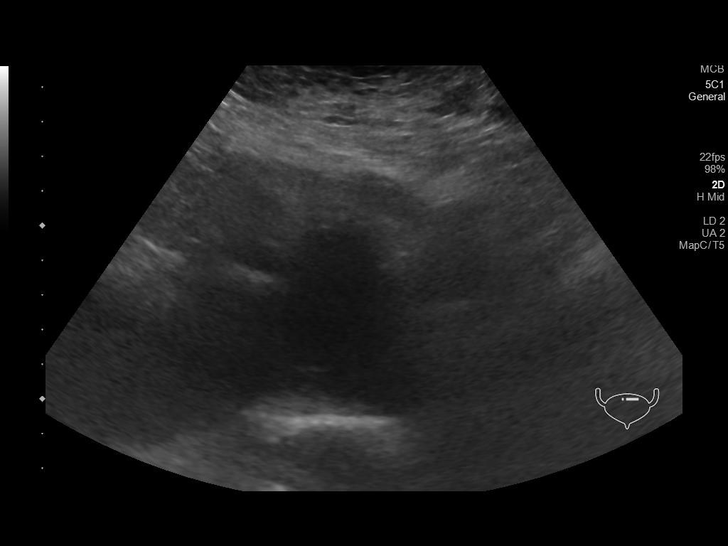
[im 34/34]
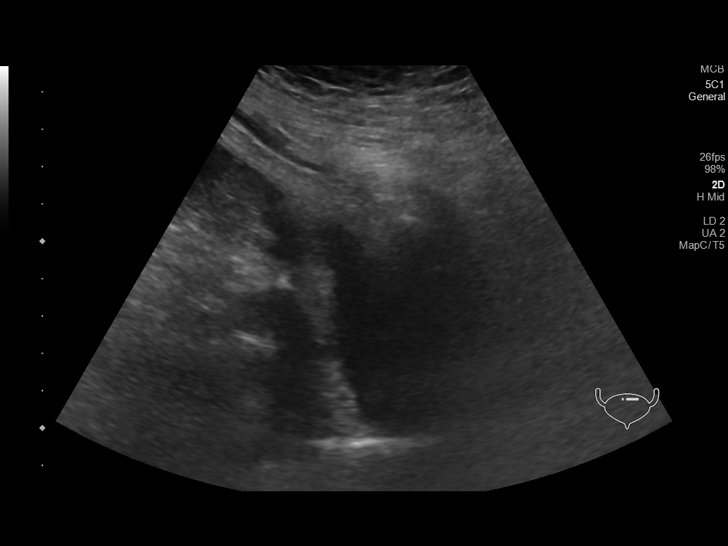

[14 of 25 positions shown; findings below may reference images not displayed]

FINDINGS: Right Kidney:

Renal measurements: 10.8 x 4.3 x 4.5 cm = volume: 111 mL .
Echogenicity within normal limits. No mass or hydronephrosis
visualized. A 1.9 cm cyst is noted

Left Kidney:

Renal measurements: 11 x 5.6 x 4.6 cm = volume: 149.9 mL.
Echogenicity within normal limits. No mass or hydronephrosis
visualized. A 1.3 cm cyst is noted.

Bladder:

Appears normal for degree of bladder distention.

New mild-to-moderate abdominal ascites.
IMPRESSION: 1. No acute sonographic abnormality detected. There is no
hydronephrosis.
2. New mild-to-moderate abdominal ascites.

## 2021-01-18 SURGERY — AMPUTATION, FOOT, TRANSMETATARSAL
Anesthesia: Monitor Anesthesia Care | Site: Toe | Laterality: Right

## 2021-01-18 MED ORDER — ONDANSETRON HCL 4 MG PO TABS: 4.0000 mg | ORAL_TABLET | Freq: Four times a day (QID) | ORAL | Status: DC | PRN

## 2021-01-18 MED ORDER — POTASSIUM CHLORIDE IN NACL 20-0.9 MEQ/L-% IV SOLN
INTRAVENOUS | Status: DC
  Filled 2021-01-18 (×2): qty 1000

## 2021-01-18 MED ORDER — 0.9 % SODIUM CHLORIDE (POUR BTL) OPTIME
TOPICAL | Status: DC | PRN
  Administered 2021-01-18: 1000 mL

## 2021-01-18 MED ORDER — FENTANYL CITRATE (PF) 100 MCG/2ML IJ SOLN
INTRAMUSCULAR | Status: AC
  Filled 2021-01-18: qty 2

## 2021-01-18 MED ORDER — MIDAZOLAM HCL 2 MG/2ML IJ SOLN
INTRAMUSCULAR | Status: AC
  Filled 2021-01-18: qty 2

## 2021-01-18 MED ORDER — PHENYLEPHRINE HCL-NACL 20-0.9 MG/250ML-% IV SOLN
INTRAVENOUS | Status: DC | PRN
  Administered 2021-01-18: 25 ug/min via INTRAVENOUS

## 2021-01-18 MED ORDER — VANCOMYCIN HCL IN DEXTROSE 1-5 GM/200ML-% IV SOLN
INTRAVENOUS | Status: AC
  Administered 2021-01-18: 1000 mg
  Filled 2021-01-18: qty 200

## 2021-01-18 MED ORDER — CEFAZOLIN SODIUM-DEXTROSE 2-3 GM-%(50ML) IV SOLR
INTRAVENOUS | Status: DC | PRN
  Administered 2021-01-18: 2 g via INTRAVENOUS

## 2021-01-18 MED ORDER — PROPOFOL 10 MG/ML IV BOLUS
INTRAVENOUS | Status: AC
  Filled 2021-01-18: qty 20

## 2021-01-18 MED ORDER — LACTATED RINGERS IV SOLN: INTRAVENOUS | Status: DC

## 2021-01-18 MED ORDER — PROPOFOL 10 MG/ML IV BOLUS
INTRAVENOUS | Status: DC | PRN
  Administered 2021-01-18: 10 mg via INTRAVENOUS

## 2021-01-18 MED ORDER — FENTANYL CITRATE (PF) 100 MCG/2ML IJ SOLN
INTRAMUSCULAR | Status: AC
  Administered 2021-01-18: 50 ug via INTRAVENOUS
  Filled 2021-01-18: qty 2

## 2021-01-18 MED ORDER — INSULIN GLARGINE-YFGN 100 UNIT/ML ~~LOC~~ SOLN
6.0000 [IU] | Freq: Every day | SUBCUTANEOUS | Status: DC
  Administered 2021-01-18 – 2021-01-20 (×3): 6 [IU] via SUBCUTANEOUS
  Filled 2021-01-18 (×4): qty 0.06

## 2021-01-18 MED ORDER — CHLORHEXIDINE GLUCONATE 0.12 % MT SOLN
OROMUCOSAL | Status: AC
  Administered 2021-01-18: 15 mL via OROMUCOSAL
  Filled 2021-01-18: qty 15

## 2021-01-18 MED ORDER — FENTANYL CITRATE (PF) 250 MCG/5ML IJ SOLN
INTRAMUSCULAR | Status: DC | PRN
  Administered 2021-01-18 (×2): 25 ug via INTRAVENOUS

## 2021-01-18 MED ORDER — ONDANSETRON HCL 4 MG/2ML IJ SOLN
INTRAMUSCULAR | Status: AC
  Filled 2021-01-18: qty 2

## 2021-01-18 MED ORDER — LACTATED RINGERS IV SOLN: INTRAVENOUS | Status: DC | PRN

## 2021-01-18 MED ORDER — FENTANYL CITRATE (PF) 250 MCG/5ML IJ SOLN
INTRAMUSCULAR | Status: AC
  Filled 2021-01-18: qty 5

## 2021-01-18 MED ORDER — LIDOCAINE 2% (20 MG/ML) 5 ML SYRINGE
INTRAMUSCULAR | Status: AC
  Filled 2021-01-18: qty 5

## 2021-01-18 MED ORDER — ROPIVACAINE HCL 7.5 MG/ML IJ SOLN
INTRAMUSCULAR | Status: DC | PRN
  Administered 2021-01-18: 20 mL via PERINEURAL

## 2021-01-18 MED ORDER — CHLORHEXIDINE GLUCONATE 0.12 % MT SOLN: 15.0000 mL | OROMUCOSAL | Status: AC

## 2021-01-18 MED ORDER — VANCOMYCIN HCL 500 MG IV SOLR
INTRAVENOUS | Status: AC
  Filled 2021-01-18: qty 10

## 2021-01-18 MED ORDER — ONDANSETRON HCL 4 MG/2ML IJ SOLN: 4.0000 mg | Freq: Four times a day (QID) | INTRAMUSCULAR | Status: DC | PRN

## 2021-01-18 MED ORDER — ONDANSETRON HCL 4 MG/2ML IJ SOLN
4.0000 mg | Freq: Once | INTRAMUSCULAR | Status: AC | PRN
  Administered 2021-01-18: 4 mg via INTRAVENOUS

## 2021-01-18 MED ORDER — VANCOMYCIN HCL 500 MG IV SOLR
INTRAVENOUS | Status: DC | PRN
  Administered 2021-01-18: 500 mg via TOPICAL

## 2021-01-18 MED ORDER — FENTANYL CITRATE (PF) 100 MCG/2ML IJ SOLN
25.0000 ug | INTRAMUSCULAR | Status: DC | PRN
  Administered 2021-01-18: 25 ug via INTRAVENOUS

## 2021-01-18 MED ORDER — DOCUSATE SODIUM 100 MG PO CAPS: 100.0000 mg | ORAL_CAPSULE | Freq: Two times a day (BID) | ORAL | Status: DC

## 2021-01-18 MED ORDER — PROPOFOL 500 MG/50ML IV EMUL
INTRAVENOUS | Status: DC | PRN
  Administered 2021-01-18: 50 ug/kg/min via INTRAVENOUS

## 2021-01-18 MED ORDER — FENTANYL CITRATE (PF) 100 MCG/2ML IJ SOLN: 50.0000 ug | Freq: Once | INTRAMUSCULAR | Status: AC

## 2021-01-18 MED ORDER — BUPIVACAINE-EPINEPHRINE 0.5% -1:200000 IJ SOLN
INTRAMUSCULAR | Status: AC
  Filled 2021-01-18: qty 1

## 2021-01-18 SURGICAL SUPPLY — 58 items
APL PRP STRL LF DISP 70% ISPRP (MISCELLANEOUS) ×1
BAG COUNTER SPONGE SURGICOUNT (BAG) ×2 IMPLANT
BAG SPNG CNTER NS LX DISP (BAG) ×1
BLADE MIC 41X13 (BLADE) ×1 IMPLANT
BLADE SAGITTAL (BLADE) ×2
BLADE SAW RECIP 87.9 MT (BLADE) IMPLANT
BLADE SAW THK.89X75X18XSGTL (BLADE) IMPLANT
BNDG CMPR 9X4 STRL LF SNTH (GAUZE/BANDAGES/DRESSINGS) ×1
BNDG ELASTIC 4X5.8 VLCR STR LF (GAUZE/BANDAGES/DRESSINGS) ×1 IMPLANT
BNDG ELASTIC 6X5.8 VLCR STR LF (GAUZE/BANDAGES/DRESSINGS) ×4 IMPLANT
BNDG ESMARK 4X9 LF (GAUZE/BANDAGES/DRESSINGS) ×2 IMPLANT
BNDG GAUZE ELAST 4 BULKY (GAUZE/BANDAGES/DRESSINGS) ×1 IMPLANT
CANISTER SUCT 3000ML PPV (MISCELLANEOUS) ×2 IMPLANT
CHLORAPREP W/TINT 26 (MISCELLANEOUS) ×2 IMPLANT
COVER SURGICAL LIGHT HANDLE (MISCELLANEOUS) ×1 IMPLANT
CUFF TOURN SGL QUICK 34 (TOURNIQUET CUFF)
CUFF TOURN SGL QUICK 42 (TOURNIQUET CUFF) IMPLANT
CUFF TRNQT CYL 34X4.125X (TOURNIQUET CUFF) IMPLANT
DRAPE U-SHAPE 47X51 STRL (DRAPES) ×4 IMPLANT
DRSG ADAPTIC 3X8 NADH LF (GAUZE/BANDAGES/DRESSINGS) ×2 IMPLANT
DRSG MEPITEL 4X7.2 (GAUZE/BANDAGES/DRESSINGS) ×2 IMPLANT
DRSG PAD ABDOMINAL 8X10 ST (GAUZE/BANDAGES/DRESSINGS) ×3 IMPLANT
ELECT REM PT RETURN 9FT ADLT (ELECTROSURGICAL) ×2
ELECTRODE REM PT RTRN 9FT ADLT (ELECTROSURGICAL) ×1 IMPLANT
GAUZE SPONGE 4X4 12PLY STRL (GAUZE/BANDAGES/DRESSINGS) ×2 IMPLANT
GLOVE SRG 8 PF TXTR STRL LF DI (GLOVE) ×2 IMPLANT
GLOVE SURG ENC MOIS LTX SZ8 (GLOVE) ×4 IMPLANT
GLOVE SURG LTX SZ8 (GLOVE) ×4 IMPLANT
GLOVE SURG UNDER POLY LF SZ8 (GLOVE) ×4
GOWN STRL REUS W/ TWL LRG LVL3 (GOWN DISPOSABLE) ×1 IMPLANT
GOWN STRL REUS W/ TWL XL LVL3 (GOWN DISPOSABLE) ×2 IMPLANT
GOWN STRL REUS W/TWL LRG LVL3 (GOWN DISPOSABLE) ×2
GOWN STRL REUS W/TWL XL LVL3 (GOWN DISPOSABLE) ×4
KIT BASIN OR (CUSTOM PROCEDURE TRAY) ×2 IMPLANT
KIT TURNOVER KIT B (KITS) ×2 IMPLANT
NDL HYPO 25GX1X1/2 BEV (NEEDLE) IMPLANT
NEEDLE HYPO 25GX1X1/2 BEV (NEEDLE) IMPLANT
NS IRRIG 1000ML POUR BTL (IV SOLUTION) ×2 IMPLANT
PACK ORTHO EXTREMITY (CUSTOM PROCEDURE TRAY) ×2 IMPLANT
PAD ARMBOARD 7.5X6 YLW CONV (MISCELLANEOUS) ×4 IMPLANT
PAD CAST 4YDX4 CTTN HI CHSV (CAST SUPPLIES) ×1 IMPLANT
PADDING CAST ABS 6INX4YD NS (CAST SUPPLIES) ×1
PADDING CAST ABS COTTON 6X4 NS (CAST SUPPLIES) ×1 IMPLANT
PADDING CAST COTTON 4X4 STRL (CAST SUPPLIES) ×2
PADDING CAST COTTON 6X4 STRL (CAST SUPPLIES) ×2 IMPLANT
SPONGE T-LAP 18X18 ~~LOC~~+RFID (SPONGE) ×3 IMPLANT
STOCKINETTE IMPERVIOUS LG (DRAPES) IMPLANT
SUCTION FRAZIER HANDLE 10FR (MISCELLANEOUS) ×2
SUCTION TUBE FRAZIER 10FR DISP (MISCELLANEOUS) ×1 IMPLANT
SUT ETHILON 2 0 FS 18 (SUTURE) ×2 IMPLANT
SUT MNCRL AB 3-0 PS2 18 (SUTURE) ×2 IMPLANT
SUT PDS AB 1 CT  36 (SUTURE) ×2
SUT PDS AB 1 CT 36 (SUTURE) ×1 IMPLANT
SYR CONTROL 10ML LL (SYRINGE) IMPLANT
TOWEL GREEN STERILE (TOWEL DISPOSABLE) ×2 IMPLANT
TOWEL GREEN STERILE FF (TOWEL DISPOSABLE) ×2 IMPLANT
TUBE CONNECTING 12X1/4 (SUCTIONS) ×2 IMPLANT
UNDERPAD 30X36 HEAVY ABSORB (UNDERPADS AND DIAPERS) ×2 IMPLANT

## 2021-01-18 NOTE — Anesthesia Procedure Notes (Signed)
Anesthesia Regional Block: Popliteal block   Pre-Anesthetic Checklist: , timeout performed,  Correct Patient, Correct Site, Correct Laterality,  Correct Procedure, Correct Position, site marked,  Risks and benefits discussed,  Surgical consent,  Pre-op evaluation,  At surgeon's request and post-op pain management  Laterality: Right  Prep: chloraprep       Needles:  Injection technique: Single-shot  Needle Type: Echogenic Stimulator Needle          Additional Needles:   Procedures:, nerve stimulator,,,,,     Nerve Stimulator or Paresthesia:  Response: plantar flexion of foot, 0.45 mA  Additional Responses:   Narrative:  Start time: 01/18/2021 6:10 PM End time: 01/18/2021 6:20 PM Injection made incrementally with aspirations every 5 mL.  Performed by: Personally  Anesthesiologist: Albertha Ghee, MD  Additional Notes: Functioning IV was confirmed and monitors were applied.  A 67mm 21ga Arrow echogenic stimulator needle was used. Sterile prep and drape,hand hygiene and sterile gloves were used.  Negative aspiration and negative test dose prior to incremental administration of local anesthetic. The patient tolerated the procedure well.  Ultrasound guidance: relevent anatomy identified, needle position confirmed, local anesthetic spread visualized around nerve(s), vascular puncture avoided.  Image printed for medical record.

## 2021-01-18 NOTE — Progress Notes (Signed)
Pt arrived to unit, assessed, informed of POC, family at bedside

## 2021-01-18 NOTE — Progress Notes (Signed)
PROGRESS NOTE  Shelby Walker PJK:[id number] DOB: 03-07-1933   PCP: Reynold Bowen, MD  Patient is from: Home.  Lives alone.  DOA: 01/16/2021 LOS: 2  Chief complaints:  Right foot wound    Brief Narrative / Interim history: 87 year old F with PMH of DM-2, RA, CKD-3B, PAD, A. fib on Eliquis, HTN and HOH presenting with right foot wound and erythema with drainage, and admitted for diabetic right foot infection.  Previously, she had an ulcer on the bottom of right foot and took antibiotics and it healed; an aortogram on 10/19/20 showed adequate circulation for wound healing at that time.  She was last seen on 11/7 by Dr. Amalia Hailey for R foot edema/erythema and was thought to have gout; she was given an rx for meloxicam.  This time, she noticed a dorsal foot wound that spontaneously appeared on 11/13, and was apparently necrotic at that time.  She went to the ER and was given Doxy and referred to Dr. Doran Durand who saw her 11/14 and sent her for direct admission.     Patient was started on ceftriaxone, vancomycin and Flagyl.  ABI ordered.  Plan for TMT amputation on 11/16.    Subjective: Seen and examined earlier this morning.  No major events overnight of this morning.  She is concerned about her elevated blood sugar and occasional heart palpitation.  Denies chest pain or dyspnea.  Denies GI or UTI symptoms.  Denies pain in her right foot.  Objective: Vitals:   01/17/21 1208 01/17/21 1546 01/17/21 1637 01/17/21 2106  BP: (!) 132/58 (!) 135/50 (!) 136/51 (!) 157/54  Pulse: 69 71 68 70  Resp: _0 Temp: 98.2 F (36.8 C) 98.3 F (36.8 C) 98.5 F (36.9 C) 98.6 F (37 C)  TempSrc: Oral Oral Oral Oral  SpO2: 95% 97% 97% 97%  Weight:      Height:        Intake/Output Summary (Last 24 hours) at 01/18/2021 1027 Last data filed at 01/17/2021 1500 Gross per 24 hour  Intake 1318.24 ml  Output --  Net 1318.24 ml   Filed Weights   01/16/21 1603  Weight: 54.5 kg     Examination:  GENERAL: No apparent distress.  Nontoxic. HEENT: MMM.  Vision and hearing grossly intact.  NECK: Supple.  No apparent JVD.  RESP:  No IWOB.  Fair aeration bilaterally. CVS: Regular rhythm at 75 bpm.  Heart sounds normal.  ABD/GI/GU: BS+. Abd soft, NTND.  MSK/EXT:  Moves extremities.  Significant deformity in her right foot from RA.  Faint DP pulse. SKIN: Ulceration with foul-smelling purulent drainage at first MTP joint with surrounding erythema and swelling. NEURO: Awake and alert. Oriented appropriately.  No apparent focal neuro deficit. PSYCH: Calm. Normal affect.   Procedures:  None  Microbiology summarized: [id number] and influenza PCR nonreactive. Blood culture pending.  Assessment & Plan: Diabetic right foot infection-seems to have failed outpatient therapy with p.o. antibiotics.  Exam with ulceration, foul-smelling purulent drainage about first right MTP joint with surrounding erythema swelling and tenderness.  Prior x-ray from 11/7 without osteo per podiatry, Dr. Amalia Hailey. Aortogram on 10/19/20 showed adequate circulation for wound healing at that time.  CRP 9.0.  ESR 50.  Blood cultures NGTD. -ABI pending -Continue Vanco, cefepime and Flagyl. -Plan for TMT amputation later today. -Continue holding Eliquis. -Appreciate help by WOCN  History of recurrent C diff infection-hopefully she does not need prolonged antibiotics if source of infection is surgically removed.  CKD-3B/azotemia: Relatively stable.  Recent Labs    07/11/20 1147 10/06/20 1216 01/16/21 1642 01/17/21 0520 01/18/21 0132  BUN 49* 44* 44* 40* 36*  CREATININE 1.18* 1.26* 1.25* 1.14* 1.30*  -Continue monitoring  Mild hyperkalemia: Resolved.  Anemia of chronic disease: H&H relatively stable.  Anemia panel normal Recent Labs    07/11/20 1147 10/06/20 1216 01/16/21 1642 01/17/21 0520 01/18/21 0132  HGB 10.4* 10.0* 10.6* 9.9* 9.3*  -Continue monitoring   IDDM-2 with hyperglycemia,  hyperlipidemia, neuropathy and diabetic foot ulcer Recent Labs  Lab 01/17/21 1211 01/17/21 1546 01/17/21 1634 01/17/21 2109 01/18/21 0750  GLUCAP 264* 303* 276* 116* 275*  -Continue SSI-sensitive -We will add basal insulin at night with holding parameters. -Continue statin and Neurontin.   Paroxysmal A. fib: Seems to be in sinus rhythm on exam.  On Coreg and Eliquis at home -Continue home Coreg -Hold Eliquis for surgery   Hypotension/essential HTN: Hypotension resolved.  Normotensive. -Resume home Coreg -Consider resumption of Coreg tomorrow   RA: Previously on Enbrel but now receiving monthly infusions of another DMARD; due for next infusion on Monday, 11/21 -Continue home prednisone 4 mg daily -Will watch for adrenal insufficiency from chronic steroid use   Goal of care counseling: DNR/DNI which is appropriate.   Increased need of nutrition Body mass index is 22.7 kg/m. Nutrition Problem: Severe Malnutrition Etiology: chronic illness (IBS) Signs/Symptoms: severe muscle depletion, moderate fat depletion, energy intake < or equal to 75% for > or equal to 1 month Interventions: MVI, Ensure Enlive (each supplement provides 350kcal and 20 grams of protein), Liberalize Diet, Education, Other (Comment) (Chopped meats)   DVT prophylaxis:    Code Status: DNR/DNI Family Communication: Updated patient's daughter at bedside on 11/15.  None at bedside today. Level of care: Med-Surg Status is: Inpatient  Remains inpatient appropriate because: Need for IV antibiotics and surgical intervention for diabetic foot infection,    Consultants:  Orthopedic surgery   Sch Meds:  Scheduled Meds:  atorvastatin  20 mg Oral QHS   carvedilol  3.125 mg Oral BID WC   cycloSPORINE  1 drop Both Eyes BID   docusate sodium  100 mg Oral BID   feeding supplement  237 mL Oral BID BM   feeding supplement  296 mL Oral Once   insulin aspart  0-5 Units Subcutaneous QHS   insulin aspart  0-9 Units  Subcutaneous TID WC   multivitamin  1 tablet Oral QHS   pantoprazole  40 mg Oral Daily   povidone-iodine  2 application Topical Once   predniSONE  4 mg Oral q AM   saccharomyces boulardii  250 mg Oral BID   Continuous Infusions:  sodium chloride 50 mL/hr at 01/16/21 1834    ceFAZolin (ANCEF) IV     ceFEPime (MAXIPIME) IV 1 g (01/17/21 2306)   metronidazole 500 mg (01/18/21 0623)   vancomycin     PRN Meds:.acetaminophen **OR** acetaminophen, acetaminophen-codeine, bisacodyl, gabapentin, hydrALAZINE, morphine injection, ondansetron **OR** ondansetron (ZOFRAN) IV, oxyCODONE, polyethylene glycol, traMADol  Antimicrobials: Anti-infectives (From admission, onward)    Start     Dose/Rate Route Frequency Ordered Stop   01/18/21 1700  vancomycin (VANCOREADY) IVPB 1000 mg/200 mL        1,000 mg 200 mL/hr over 60 Minutes Intravenous Every 48 hours 01/16/21 1804     01/18/21 0600  ceFAZolin (ANCEF) IVPB 2g/100 mL premix        2 g 200 mL/hr over 30 Minutes Intravenous On call to O.R. 01/17/21 2005 01/19/21 0559   01/17/21  1100  ceFEPIme (MAXIPIME) 1 g in sodium chloride 0.9 % 100 mL IVPB        1 g 200 mL/hr over 30 Minutes Intravenous Every 12 hours 01/17/21 1011     01/16/21 1700  cefTRIAXone (ROCEPHIN) 2 g in sodium chloride 0.9 % 100 mL IVPB  Status:  Discontinued        2 g 200 mL/hr over 30 Minutes Intravenous Every 24 hours 01/16/21 1607 01/17/21 0953   01/16/21 1700  metroNIDAZOLE (FLAGYL) IVPB 500 mg        500 mg 100 mL/hr over 60 Minutes Intravenous Every 8 hours 01/16/21 1607 01/23/21 2159   01/16/21 1700  vancomycin (VANCOREADY) IVPB 1250 mg/250 mL        1,250 mg 166.7 mL/hr over 90 Minutes Intravenous  Once 01/16/21 1614 01/16/21 2324        I have personally reviewed the following labs and images: CBC: Recent Labs  Lab 01/16/21 1642 01/17/21 0520 01/18/21 0132  WBC 10.1 7.8 7.0  NEUTROABS 8.1*  --   --   HGB 10.6* 9.9* 9.3*  HCT 32.4* 30.1* 29.1*  MCV 93.1  92.6 93.6  PLT 269 244 244   BMP &GFR Recent Labs  Lab 01/16/21 1642 01/17/21 0520 01/18/21 0132  NA 139 142 139  K 5.2* 4.4 4.2  CL 103 107 108  CO2 27 26 21*  GLUCOSE 321* 117* 183*  BUN 44* 40* 36*  CREATININE 1.25* 1.14* 1.30*  CALCIUM 8.8* 8.2* 8.1*  MG  --   --  1.9  PHOS  --   --  3.7   Estimated Creatinine Clearance: 23.9 mL/min (A) (by C-G formula based on SCr of 1.3 mg/dL (H)). Liver & Pancreas: Recent Labs  Lab 01/16/21 1642 01/18/21 0132  AST 17  --   ALT 18  --   ALKPHOS 55  --   BILITOT 0.6  --   PROT 6.0*  --   ALBUMIN 2.9* 2.4*   No results for input(s): LIPASE, AMYLASE in the last 168 hours. No results for input(s): AMMONIA in the last 168 hours. Diabetic: Recent Labs    01/18/21 0132  HGBA1C 7.9*   Recent Labs  Lab 01/17/21 1211 01/17/21 1546 01/17/21 1634 01/17/21 2109 01/18/21 0750  GLUCAP 264* 303* 276* 116* 275*   Cardiac Enzymes: No results for input(s): CKTOTAL, CKMB, CKMBINDEX, TROPONINI in the last 168 hours. No results for input(s): PROBNP in the last 8760 hours. Coagulation Profile: No results for input(s): INR, PROTIME in the last 168 hours. Thyroid Function Tests: No results for input(s): TSH, T4TOTAL, FREET4, T3FREE, THYROIDAB in the last 72 hours. Lipid Profile: Recent Labs    01/18/21 0132  CHOL 102  HDL 36*  LDLCALC 42  TRIG 121  CHOLHDL 2.8   Anemia Panel: Recent Labs    01/18/21 0132  VITAMINB12 525  FOLATE 36.6  FERRITIN 52  TIBC 200*  IRON 71  RETICCTPCT 1.7   Urine analysis:    Component Value Date/Time   COLORURINE YELLOW 07/31/2018 0516   APPEARANCEUR HAZY (A) 07/31/2018 0516   LABSPEC 1.011 07/31/2018 0516   PHURINE 5.0 07/31/2018 0516   GLUCOSEU NEGATIVE 07/31/2018 0516   HGBUR SMALL (A) 07/31/2018 0516   BILIRUBINUR NEGATIVE 07/31/2018 0516   KETONESUR NEGATIVE 07/31/2018 0516   PROTEINUR NEGATIVE 07/31/2018 0516   NITRITE NEGATIVE 07/31/2018 0516   LEUKOCYTESUR NEGATIVE 07/31/2018  0516   Sepsis Labs: Invalid input(s): PROCALCITONIN, LACTICIDVEN  Microbiology: Recent Results (from the  past 240 hour(s))  Blood Cultures x 2 sites     Status: None (Preliminary result)   Collection Time: 01/16/21  4:42 PM   Specimen: BLOOD  Result Value Ref Range Status   Specimen Description BLOOD LEFT ANTECUBITAL  Final   Special Requests   Final    BOTTLES DRAWN AEROBIC ONLY Blood Culture adequate volume   Culture   Final    NO GROWTH < 24 HOURS Performed at [facility], [address], [place], [place] [address]    Report Status PENDING  Incomplete  Blood Cultures x 2 sites     Status: None (Preliminary result)   Collection Time: 01/16/21  4:42 PM   Specimen: BLOOD LEFT ARM  Result Value Ref Range Status   Specimen Description BLOOD LEFT ARM  Final   Special Requests   Final    BOTTLES DRAWN AEROBIC ONLY Blood Culture adequate volume   Culture   Final    NO GROWTH < 24 HOURS Performed at [facility], [place] [address], [place], [place] [address]    Report Status PENDING  Incomplete  Resp Panel by RT-PCR (Flu A&B, Covid) Nasopharyngeal Swab     Status: None   Collection Time: 01/16/21  5:12 PM   Specimen: Nasopharyngeal Swab; Nasopharyngeal(NP) swabs in vial transport medium  Result Value Ref Range Status   SARS Coronavirus 2 by RT PCR NEGATIVE NEGATIVE Final    Comment: (NOTE) SARS-CoV-2 target nucleic acids are NOT DETECTED.  The SARS-CoV-2 RNA is generally detectable in upper respiratory specimens during the acute phase of infection. The lowest concentration of SARS-CoV-2 viral copies this assay can detect is 138 copies/mL. A negative result does not preclude SARS-Cov-2 infection and should not be used as the sole basis for treatment or other patient management decisions. A negative result may occur with  improper specimen collection/handling, submission of specimen other than nasopharyngeal swab, presence of viral mutation(s) within the areas  targeted by this assay, and inadequate number of viral copies(<138 copies/mL). A negative result must be combined with clinical observations, patient history, and epidemiological information. The expected result is Negative.  Fact Sheet for Patients:  EntrepreneurPulse.com.au  Fact Sheet for Healthcare Providers:  IncredibleEmployment.be  This test is no t yet approved or cleared by the [place] FDA and  has been authorized for detection and/or diagnosis of SARS-CoV-2 by FDA under an Emergency Use Authorization (EUA). This EUA will remain  in effect (meaning this test can be used) for the duration of the COVID-19 declaration under Section 564(b)(1) of the Act, 21 U.S.C.section 360bbb-3(b)(1), unless the authorization is terminated  or revoked sooner.       Influenza A by PCR NEGATIVE NEGATIVE Final   Influenza B by PCR NEGATIVE NEGATIVE Final    Comment: (NOTE) The Xpert Xpress SARS-CoV-2/FLU/RSV plus assay is intended as an aid in the diagnosis of influenza from Nasopharyngeal swab specimens and should not be used as a sole basis for treatment. Nasal washings and aspirates are unacceptable for Xpert Xpress SARS-CoV-2/FLU/RSV testing.  Fact Sheet for Patients: EntrepreneurPulse.com.au  Fact Sheet for Healthcare Providers: IncredibleEmployment.be  This test is not yet approved or cleared by the [place] FDA and has been authorized for detection and/or diagnosis of SARS-CoV-2 by FDA under an Emergency Use Authorization (EUA). This EUA will remain in effect (meaning this test can be used) for the duration of the COVID-19 declaration under Section 564(b)(1) of the Act, 21 U.S.C. section 360bbb-3(b)(1), unless the authorization  is terminated or revoked.  Performed at [facility], [place] [address], [place], [place] [address]     Radiology Studies: No results found.    Advaith Lamarque T.  [place]  If 7PM-7AM, please contact night-coverage [email or web address] 01/18/2021, 10:27 AM

## 2021-01-18 NOTE — Progress Notes (Signed)
Completed -pre surgery Ensure at 0400

## 2021-01-18 NOTE — Anesthesia Procedure Notes (Signed)
Procedure Name: MAC Date/Time: 01/18/2021 7:27 PM Performed by: Oletta Lamas, CRNA Pre-anesthesia Checklist: Patient identified, Emergency Drugs available, Suction available, Patient being monitored and Timeout performed Patient Re-evaluated:Patient Re-evaluated prior to induction Oxygen Delivery Method: Simple face mask

## 2021-01-18 NOTE — Progress Notes (Signed)
Inpatient Diabetes Program Recommendations  AACE/ADA: New Consensus Statement on Inpatient Glycemic Control   Target Ranges:  Prepandial:   less than 140 mg/dL      Peak postprandial:   less than 180 mg/dL (1-2 hours)      Critically ill patients:  140 - 180 mg/dL    Results for MARIJA, CALAMARI (MRN [id number]) as of 01/18/2021 08:21  Ref. Range 01/17/2021 08:10 01/17/2021 12:11 01/17/2021 15:46 01/17/2021 16:34 01/17/2021 21:09 01/18/2021 07:50  Glucose-Capillary Latest Ref Range: 70 - 99 mg/dL 106 (H) 264 (H) 303 (H) 276 (H) 116 (H) 275 (H)   Review of Glycemic Control  Diabetes history: DM2 Outpatient Diabetes medications: Tresiba 6-8 units QHS, Novolog 6 units TID with meals Current orders for Inpatient glycemic control: Novolog 0-9 units TID with meals, Novolog 0-5 units QHS; Prednisone 4 mg QAM   Inpatient Diabetes Program Recommendations:     Insulin: Please consider ordering Semglee 4 units QHS.  Thanks, Barnie Alderman, RN, MSN, CDE Diabetes Coordinator Inpatient Diabetes Program [phone] (Team Pager from 8am to 5pm)

## 2021-01-18 NOTE — Interval H&P Note (Signed)
History and Physical Interval Note:  01/18/2021 7:16 PM  Shelby Walker  has presented today for surgery, with the diagnosis of right osteomylitis.  The various methods of treatment have been discussed with the patient and family. After consideration of risks, benefits and other options for treatment, the patient has consented to  Procedure(s): TRANSMETATARSAL AMPUTATION (Right) as a surgical intervention.  The patient's history has been reviewed, patient examined, no change in status, stable for surgery.  I have reviewed the patient's chart and labs.  Questions were answered to the patient's satisfaction.     Shelby Walker The risks and benefits of the alternative treatment options have been discussed in detail.  The patient wishes to proceed with surgery and specifically understands risks of bleeding, infection, nerve damage, blood clots, need for additional surgery, amputation and death.

## 2021-01-18 NOTE — Progress Notes (Signed)
Mobility Specialist Progress Note   01/18/21 1400  Mobility  Activity Contraindicated/medical hold   Hold on mobility session d/t scheduled surgery today.  Holland Falling Mobility Specialist Phone Number [phone]

## 2021-01-18 NOTE — Anesthesia Preprocedure Evaluation (Addendum)
Anesthesia Evaluation  Patient identified by MRN, date of birth, ID band Patient awake    Reviewed: Allergy & Precautions, H&P , NPO status , Patient's Chart, lab work & pertinent test results  Airway Mallampati: II   Neck ROM: full    Dental   Pulmonary neg pulmonary ROS,    breath sounds clear to auscultation       Cardiovascular hypertension, + Peripheral Vascular Disease  + dysrhythmias (h/o afib on eliquis) Atrial Fibrillation + Valvular Problems/Murmurs MR  Rhythm:regular Rate:Normal     Neuro/Psych Diabetic neuropathy negative psych ROS   GI/Hepatic GERD  ,  Endo/Other  diabetes, Type 2, Insulin Dependent  Renal/GU Renal InsufficiencyRenal disease     Musculoskeletal  (+) Arthritis  (on prednisone and a DMARD), Rheumatoid disorders and steroids,    Abdominal   Peds  Hematology  (+) anemia ,   Anesthesia Other Findings   Reproductive/Obstetrics                            Anesthesia Physical Anesthesia Plan  ASA: 3  Anesthesia Plan: MAC and Regional   Post-op Pain Management:    Induction: Intravenous  PONV Risk Score and Plan: 2  Airway Management Planned: Natural Airway and Simple Face Mask  Additional Equipment:   Intra-op Plan:   Post-operative Plan:   Informed Consent: I have reviewed the patients History and Physical, chart, labs and discussed the procedure including the risks, benefits and alternatives for the proposed anesthesia with the patient or authorized representative who has indicated his/her understanding and acceptance.   Patient has DNR.   Dental advisory given  Plan Discussed with: Anesthesiologist, CRNA and Surgeon  Anesthesia Plan Comments:        Anesthesia Quick Evaluation

## 2021-01-18 NOTE — Op Note (Signed)
01/16/2021 - 01/18/2021  8:08 PM  PATIENT:  Shelby Walker  87 y.o. female  PRE-OPERATIVE DIAGNOSIS:  right foot gangrene  POST-OPERATIVE DIAGNOSIS:  same  Procedure(s):  Right TRANSMETATARSAL AMPUTATION  SURGEON:  Wylene Simmer, MD  ASSISTANT: none  ANESTHESIA:   mac, regional  EBL:  minimal   TOURNIQUET:  17 min with ankle esmarch  COMPLICATIONS:  None apparent  DISPOSITION:  Extubated, awake and stable to recovery.  INDICATION FOR PROCEDURE:  87 y/o female with PMH of RA and diabetes on prednisone, orencia and eliquis has a h/o swelling and erythema of the right dorsal foot for several weeks.  It progressed to frank gangrene over the dorsum of the medial forefoot.  She presents today for right transmetatarsal amputation.  The risks and benefits of the alternative treatment options have been discussed in detail.  The patient wishes to proceed with surgery and specifically understands risks of bleeding, infection, nerve damage, blood clots, need for additional surgery, amputation and death.   PROCEDURE IN DETAIL:  After pre operative consent was obtained, and the correct operative site was identified, the patient was brought to the operating room and placed supine on the OR table.  Anesthesia was administered.  Pre-operative antibiotics were administered.  A surgical timeout was taken.  The right lower extremity was prepped and draped in standard sterile fashion.  The foot was exsanguinated and Esmarch tourniquet wrapped around the ankle.  A fishmouth incision was marked around the forefoot proximal from the area of gangrene.  Incision was made and dissection carried down through the subcutaneous tissues.  Subperiosteal dissection was carried proximally.  Soft tissues were protected.  The oscillating saw was used to cut through the metatarsals beveling the cuts appropriately.  The plantar soft tissues were divided creating a full-thickness plantar flap.  The cut surfaces of bone were smoothed  with a rasp.  The wound was irrigated copiously.  There was no sign of residual infection or necrotic tissue.  Neurovascular bundles were cauterized.  500 mg of vancomycin powder was sprinkled in the deep layer of the wound.  Skin incision was closed with simple and horizontal mattress sutures of 2-0 nylon.  Sterile dressings were applied followed by compression wrap.  The tourniquet was released after application of the dressings.  The patient was awakened from anesthesia and transported to the recovery room in stable condition.   FOLLOW UP PLAN: 24 hours of postoperative antibiotics.  Weightbearing as tolerated in a cam boot.  Resume Eliquis 24 hours postop.

## 2021-01-19 ENCOUNTER — Encounter (HOSPITAL_COMMUNITY): Payer: Self-pay | Admitting: Orthopedic Surgery

## 2021-01-19 ENCOUNTER — Encounter (HOSPITAL_COMMUNITY): Payer: Medicare Other

## 2021-01-19 DIAGNOSIS — M25512 Pain in left shoulder: Secondary | ICD-10-CM

## 2021-01-19 LAB — RENAL FUNCTION PANEL
Albumin: 2.3 g/dL — ABNORMAL LOW (ref 3.5–5.0)
Anion gap: 8 (ref 5–15)
BUN: 20 mg/dL (ref 8–23)
CO2: 23 mmol/L (ref 22–32)
Calcium: 8 mg/dL — ABNORMAL LOW (ref 8.9–10.3)
Chloride: 109 mmol/L (ref 98–111)
Creatinine, Ser: 1.11 mg/dL — ABNORMAL HIGH (ref 0.44–1.00)
GFR, Estimated: 49 mL/min — ABNORMAL LOW (ref >60)
Glucose, Bld: 123 mg/dL — ABNORMAL HIGH (ref 70–99)
Phosphorus: 3.1 mg/dL (ref 2.5–4.6)
Potassium: 4.2 mmol/L (ref 3.5–5.1)
Sodium: 140 mmol/L (ref 135–145)

## 2021-01-19 LAB — CBC
HCT: 30.3 % — ABNORMAL LOW (ref 36.0–46.0)
Hemoglobin: 9.6 g/dL — ABNORMAL LOW (ref 12.0–15.0)
MCH: 29.5 pg (ref 26.0–34.0)
MCHC: 31.7 g/dL (ref 30.0–36.0)
MCV: 93.2 fL (ref 80.0–100.0)
Platelets: 238 10*3/uL (ref 150–400)
RBC: 3.25 MIL/uL — ABNORMAL LOW (ref 3.87–5.11)
RDW: 13.2 % (ref 11.5–15.5)
WBC: 9.3 10*3/uL (ref 4.0–10.5)
nRBC: 0 % (ref 0.0–0.2)

## 2021-01-19 LAB — GLUCOSE, CAPILLARY
Glucose-Capillary: 100 mg/dL — ABNORMAL HIGH (ref 70–99)
Glucose-Capillary: 124 mg/dL — ABNORMAL HIGH (ref 70–99)
Glucose-Capillary: 135 mg/dL — ABNORMAL HIGH (ref 70–99)
Glucose-Capillary: 241 mg/dL — ABNORMAL HIGH (ref 70–99)
Glucose-Capillary: 181 mg/dL — ABNORMAL HIGH (ref 70–99)

## 2021-01-19 LAB — MAGNESIUM: Magnesium: 1.7 mg/dL (ref 1.7–2.4)

## 2021-01-19 MED ORDER — LIDOCAINE 5 % EX PTCH
1.0000 | MEDICATED_PATCH | CUTANEOUS | Status: DC
  Administered 2021-01-19 – 2021-01-24 (×6): 1 via TRANSDERMAL
  Filled 2021-01-19 (×6): qty 1

## 2021-01-19 MED ORDER — DICLOFENAC SODIUM 1 % EX GEL
2.0000 g | Freq: Four times a day (QID) | CUTANEOUS | Status: DC
  Administered 2021-01-19 – 2021-01-24 (×16): 2 g via TOPICAL
  Filled 2021-01-19: qty 100

## 2021-01-19 MED ORDER — APIXABAN 2.5 MG PO TABS
2.5000 mg | ORAL_TABLET | Freq: Two times a day (BID) | ORAL | Status: DC
  Administered 2021-01-19 – 2021-01-24 (×10): 2.5 mg via ORAL
  Filled 2021-01-19 (×10): qty 1

## 2021-01-19 MED ORDER — MORPHINE SULFATE (PF) 2 MG/ML IV SOLN: 2.0000 mg | INTRAVENOUS | Status: DC | PRN

## 2021-01-19 MED ORDER — METOCLOPRAMIDE HCL 5 MG/ML IJ SOLN
5.0000 mg | Freq: Four times a day (QID) | INTRAMUSCULAR | Status: DC | PRN
  Administered 2021-01-19 – 2021-01-20 (×4): 5 mg via INTRAVENOUS
  Filled 2021-01-19 (×4): qty 2

## 2021-01-19 MED ORDER — OXYCODONE HCL 5 MG PO TABS
5.0000 mg | ORAL_TABLET | ORAL | Status: DC | PRN
  Administered 2021-01-19 – 2021-01-23 (×5): 5 mg via ORAL
  Filled 2021-01-19 (×6): qty 1

## 2021-01-19 MED ORDER — MORPHINE SULFATE (PF) 2 MG/ML IV SOLN
2.0000 mg | INTRAVENOUS | Status: DC | PRN
  Administered 2021-01-19 – 2021-01-22 (×11): 2 mg via INTRAVENOUS
  Filled 2021-01-19 (×11): qty 1

## 2021-01-19 NOTE — Progress Notes (Signed)
Subjective: 1 Day Post-Op Procedure(s) (LRB): TRANSMETATARSAL AMPUTATION (Right) Patient reports pain as severe.  Pain not controlled with IV morphine and tylenol with codeine.  Daughter at bedside.  Objective: Vital signs in last 24 hours: Temp:  [98 F (36.7 C)-98.9 F (37.2 C)] 98.9 F (37.2 C) (11/17 0801) Pulse Rate:  [69-83] 83 (11/17 0801) Resp:  [10-25] 17 (11/17 0801) BP: (109-192)/(42-69) 136/53 (11/17 0801) SpO2:  [95 %-100 %] 97 % (11/17 0801)  Intake/Output from previous day: No intake/output data recorded. Intake/Output this shift: No intake/output data recorded.  Recent Labs    01/16/21 1642 01/17/21 0520 01/18/21 0132 01/19/21 0743  HGB 10.6* 9.9* 9.3* 9.6*   Recent Labs    01/18/21 0132 01/19/21 0743  WBC 7.0 9.3  RBC 3.11*  3.05* 3.25*  HCT 29.1* 30.3*  PLT 244 238   Recent Labs    01/18/21 0132 01/19/21 0743  NA 139 140  K 4.2 4.2  CL 108 109  CO2 21* 23  BUN 36* 20  CREATININE 1.30* 1.11*  GLUCOSE 183* 123*  CALCIUM 8.1* 8.0*   No results for input(s): LABPT, INR in the last 72 hours.  PE:  right foot dressed and dry.  No bleeding.  Active PF and DF of the ankle.   Assessment/Plan: 1 Day Post-Op Procedure(s) (LRB): TRANSMETATARSAL AMPUTATION (Right) Up with therapy once cam boot is on.  Cam boot ordered.   Adjusted pain meds.  D/c'd tylenol with codeine and added oxycodone.  Increased morphine frequency to q1h prn breakthrough pain.  Ok to stop abx after final doses this evening.      Wylene Simmer 01/19/2021, 11:02 AM

## 2021-01-19 NOTE — Progress Notes (Signed)
Mobility Specialist Progress Note   01/19/21 1800  Mobility  Activity Contraindicated/medical hold   Per request of nurse, pt has been in a lot of pain today and needs to rest.   Holland Falling Mobility Specialist Phone Number [phone]

## 2021-01-19 NOTE — Progress Notes (Signed)
Orthopedic Tech Progress Note Patient Details:  Shelby Walker [birth year] [id number]  Ortho Devices Type of Ortho Device: CAM walker Ortho Device/Splint Interventions: Ordered    Left CAM on counter for therapy  Meggie Laseter A [name] 01/19/2021, 11:57 AM

## 2021-01-19 NOTE — Care Management Important Message (Signed)
Important Message  Patient Details  Name: Shelby Walker MRN: [id number] Date of Birth: September 10, 1933   Medicare Important Message Given:  Yes     Joetta Manners 01/19/2021, 2:19 PM

## 2021-01-19 NOTE — Evaluation (Signed)
Physical Therapy Evaluation Patient Details Name: Shelby Walker MRN: [id number] DOB: June 21, 1933 Today's Date: 01/19/2021  History of Present Illness  87 yo female s/p R transmet amputation due to gangrene. PMH includes cdiff, DM, GERD, osteoporosis, RA.  Clinical Impression  Pt presents with generalized weakness, severe RLE pain post-op, difficulty performing mobility tasks, and decreased activity tolerance. Pt to benefit from acute PT to address deficits. Pt performed x3 SPT maintaining precuations well, had difficulty having cam boot on given severe pain so second transfer performed without boot and NWB RLE. PT to progress mobility as tolerated, and will continue to follow acutely.       Recommendations for follow up therapy are one component of a multi-disciplinary discharge planning process, led by the attending physician.  Recommendations may be updated based on patient status, additional functional criteria and insurance authorization.  Follow Up Recommendations Skilled nursing-short term rehab (<3 hours/day)    Assistance Recommended at Discharge Frequent or constant Supervision/Assistance  Functional Status Assessment Patient has had a recent decline in their functional status and demonstrates the ability to make significant improvements in function in a reasonable and predictable amount of time.  Equipment Recommendations  None recommended by PT    Recommendations for Other Services       Precautions / Restrictions Precautions Precautions: Fall Required Braces or Orthoses: Other Brace Other Brace: CAM boot when OOB and WB RLE Restrictions Weight Bearing Restrictions: No RLE Weight Bearing: Weight bearing as tolerated (in cam)      Mobility  Bed Mobility Overal bed mobility: Needs Assistance Bed Mobility: Supine to Sit     Supine to sit: Min assist;HOB elevated     General bed mobility comments: assist for RLE to EOB    Transfers Overall transfer level: Needs  assistance Equipment used: Rolling walker (2 wheels) Transfers: Bed to chair/wheelchair/BSC   Stand pivot transfers: Min assist         General transfer comment: min assist for pivot x3, x2 to L to recliner then BSC and x1 to R. Pt with CAM boot donned for first transfer, too painful and did better NWB without CAM boot for second two pivots.    Ambulation/Gait                  Stairs            Wheelchair Mobility    Modified Rankin (Stroke Patients Only)       Balance Overall balance assessment: Needs assistance Sitting-balance support: No upper extremity supported Sitting balance-Leahy Scale: Fair     Standing balance support: Bilateral upper extremity supported;During functional activity Standing balance-Leahy Scale: Poor                               Pertinent Vitals/Pain Pain Assessment: 0-10 Pain Score: 10-Worst pain ever Pain Location: R foot Pain Descriptors / Indicators: Constant;Sore;Other (Comment) (+ vomiting) Pain Intervention(s): Limited activity within patient's tolerance;Monitored during session;Premedicated before session;Patient requesting pain meds-RN notified;Repositioned    Home Living Family/patient expects to be discharged to:: Private residence Living Arrangements: Alone Available Help at Discharge: Family;Available 24 hours/day Type of Home: House Home Access: Stairs to enter   [employer] of Steps: 3   Home Layout: Able to live on main level with bedroom/bathroom Home Equipment: Conservation officer, nature (2 wheels);Shower seat      Prior Function Prior Level of Function : Needs assist  Mobility Comments: pt reports using RW or no AD for gait, reports no history of falls in the past year ADLs Comments: pt's family grocery shops and cooks for pt as needed, otherwise pt indepedent     Hand Dominance   Dominant Hand: Right    Extremity/Trunk Assessment   Upper Extremity Assessment Upper  Extremity Assessment: Defer to OT evaluation    Lower Extremity Assessment Lower Extremity Assessment: Generalized weakness;RLE deficits/detail RLE Deficits / Details: limited by pain; able to perform SLR RLE: Unable to fully assess due to pain    Cervical / Trunk Assessment Cervical / Trunk Assessment: Normal  Communication   Communication: No difficulties  Cognition Arousal/Alertness: Awake/alert Behavior During Therapy: WFL for tasks assessed/performed Overall Cognitive Status: Within Functional Limits for tasks assessed                                          General Comments      Exercises     Assessment/Plan    PT Assessment Patient needs continued PT services  PT Problem List Decreased strength;Decreased mobility;Decreased activity tolerance;Decreased balance;Decreased knowledge of use of DME;Pain;Decreased knowledge of precautions;Decreased safety awareness       PT Treatment Interventions DME instruction;Therapeutic activities;Gait training;Therapeutic exercise;Patient/family education;Balance training;Stair training;Neuromuscular re-education;Functional mobility training    PT Goals (Current goals can be found in the Care Plan section)  Acute Rehab PT Goals PT Goal Formulation: With patient Time For Goal Achievement: 02/02/21 Potential to Achieve Goals: Good    Frequency Min 3X/week   Barriers to discharge        Co-evaluation               AM-PAC PT "6 Clicks" Mobility  Outcome Measure Help needed turning from your back to your side while in a flat bed without using bedrails?: A Little Help needed moving from lying on your back to sitting on the side of a flat bed without using bedrails?: A Little Help needed moving to and from a bed to a chair (including a wheelchair)?: A Lot Help needed standing up from a chair using your arms (e.g., wheelchair or bedside chair)?: A Lot Help needed to walk in hospital room?: A Lot Help needed  climbing 3-5 steps with a railing? : Total 6 Click Score: 13    End of Session Equipment Utilized During Treatment: Other (comment) (R CAM boot) Activity Tolerance: Patient limited by pain;Patient limited by fatigue Patient left: in chair;with call bell/phone within reach;with chair alarm set;with family/visitor present Nurse Communication: Mobility status;Patient requests pain meds PT Visit Diagnosis: Other abnormalities of gait and mobility (R26.89);Pain Pain - Right/Left: Right Pain - part of body: Ankle and joints of foot    Time: 2751-7001 PT Time Calculation (min) (ACUTE ONLY): 32 min   Charges:   PT Evaluation $PT Eval Low Complexity: 1 Low PT Treatments $Therapeutic Activity: 8-22 mins        Stacie Glaze, PT DPT Acute Rehabilitation Services Pager [phone]  Office [phone]   Odes Lolli E Ruffin Pyo 01/19/2021, 5:06 PM

## 2021-01-19 NOTE — TOC Initial Note (Addendum)
Transition of Care [facility]) - Initial/Assessment Note    Patient Details  Name: Shelby Walker MRN: [id number] Date of Birth: 1933-09-28  [facility] CM/SW Contact:    Sharin Mons, RN Phone Number: 01/19/2021, 10:09 AM  Clinical Narrative:                    -  s/p Right TRANSMETATARSAL AMPUTATION, 11/15  From home alone. Supportive daughter and son.  NCM spoke with pt @ bedside regarding d/c planning along with daughter [name] after receiving the ok from pt. Pt states PTA independent with ADL's, uses rolling walker with ambulation. States daughter lives 5 min from her and son lives close by, 2 houses down. Pt states has no problems affording copay for Rx meds.  PCP: Reynold Bowen  PT/OT evaluations pending .Marland Kitchen..TOC team will continue to monitor and assist with needs.    Expected Discharge Plan: [place] (vs SNF) Barriers to Discharge: Continued Medical Work up   Patient Goals and CMS Choice   CMS Medicare.gov Compare Post Acute Care list provided to:: Patient Choice offered to / list presented to : Adult Children  Expected Discharge Plan and Services Expected Discharge Plan: [place] (vs SNF)   Discharge Planning Services: CM Consult   Living arrangements for the past 2 months: Single Family Home                                      Prior Living Arrangements/Services Living arrangements for the past 2 months: Single Family Home Lives with:: Self Patient language and need for interpreter reviewed:: Yes Do you feel safe going back to the place where you live?: Yes      Need for Family Participation in Patient Care: Yes (Comment) Care giver support system in place?: Yes (comment) Current home services: DME (CANE , WALKER) Criminal Activity/Legal Involvement Pertinent to Current Situation/Hospitalization: No - Comment as needed  Activities of Daily Living Home Assistive Devices/Equipment: CBG Meter, Eyeglasses,  Walker (specify type), Cane (specify quad or straight), Dentures (specify type) (upper and lower dentures in) ADL Screening (condition at time of admission) Patient's cognitive ability adequate to safely complete daily activities?: Yes Is the patient deaf or have difficulty hearing?: Yes Does the patient have difficulty seeing, even when wearing glasses/contacts?: No Does the patient have difficulty concentrating, remembering, or making decisions?: No Patient able to express need for assistance with ADLs?: Yes Does the patient have difficulty dressing or bathing?: No Independently performs ADLs?: Yes (appropriate for developmental age) Does the patient have difficulty walking or climbing stairs?: Yes Weakness of Legs: Both Weakness of Arms/Hands: Both  Permission Sought/Granted   Permission granted to share information with : Yes, Verbal Permission Granted  Share Information with NAME: [name] (Daughter)  [phone] Kamaiya Antilla (Son)     [phone]              Emotional Assessment Appearance:: Appears stated age Attitude/Demeanor/Rapport: Gracious Affect (typically observed): Accepting Orientation: : Oriented to Self, Oriented to Place, Oriented to  Time, Oriented to Situation Alcohol / Substance Use: Not Applicable Psych Involvement: No (comment)  Admission diagnosis:  Diabetic foot infection ([place]) [U72.536, L08.9] Patient Active Problem List   Diagnosis Date Noted   Protein-calorie malnutrition, severe 01/17/2021   Diabetic foot infection ([place]) 01/16/2021   Rheumatoid arthritis ([place]) 01/16/2021   Stage 3b chronic  kidney disease (CKD) ([place]) 01/16/2021   DNR (do not resuscitate) 01/16/2021   Decreased anal sphincter tone 12/13/2018   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   Irritable bowel syndrome with diarrhea - post infectious 12/12/2018   Full incontinence of feces and fecal smearing 12/12/2018   Perianal dermatitis 12/12/2018   Uncontrolled type 2 diabetes mellitus with  hyperglycemia ([place]) 10/26/2018   Long-term use of immunosuppressant medication-Enbrel 10/26/2018   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 08/27/2018   Thrombocytopenia (HCC)    Essential hypertension    Dyspnea on exertion    Steroid-induced hyperglycemia    Fatigue 08/06/2018   Paroxysmal atrial fibrillation ([place])    Mixed hyperlipidemia 09/29/2016   Stable angina ([place]) 09/29/2016   PAD (peripheral artery disease) ([place]) 08/09/2016   Preoperative clearance 05/21/2012   Precordial chest pain    Palpitations    Diabetes mellitus (HCC)    GERD (gastroesophageal reflux disease)    Ejection fraction    Carotid artery disease ([place])    Mitral regurgitation 04/21/2009   PSORIASIS 02/09/2009   Rheumatoid arthritis flare ([place]) 02/09/2009   PCP:  Reynold Bowen, MD Pharmacy:   [facility] [place], [place] [address] Phone: [phone] Fax: [phone]     Social Determinants of Health (SDOH) Interventions    Readmission Risk Interventions No flowsheet data found.

## 2021-01-19 NOTE — Progress Notes (Signed)
PROGRESS NOTE  Shelby Walker EVO:[id number] DOB: 29-Mar-1933   PCP: Reynold Bowen, MD  Patient is from: Home.  Lives alone.  DOA: 01/16/2021 LOS: 3  Chief complaints:  Right foot wound    Brief Narrative / Interim history: 87 year old F with PMH of DM-2, RA, CKD-3B, PAD, A. fib on Eliquis, HTN and HOH presenting with right foot wound and erythema with drainage, and admitted for diabetic right foot infection.  Previously, she had an ulcer on the bottom of right foot and took antibiotics and it healed; an aortogram on 10/19/20 showed adequate circulation for wound healing at that time.  She was last seen on 11/7 by Dr. Amalia Hailey for R foot edema/erythema and was thought to have gout; she was given an rx for meloxicam.  This time, she noticed a dorsal foot wound that spontaneously appeared on 11/13, and was apparently necrotic at that time.  She went to the ER and was given Doxy and referred to Dr. Doran Durand who saw her 11/14 and sent her for direct admission.     Patient was started on ceftriaxone, vancomycin and Flagyl.  ABI ordered.  Underwent right TMT on 01/18/2021 by Dr. Doran Durand.    Subjective: Seen and examined earlier this morning.  Patient's daughter at bedside.  Patient complains left shoulder pain.  It seems she attempted to break her fall by holding to enter with left hand about 3 weeks ago and had a pain that has improved.  Hip pain is worse this morning.  Daughter thinks she might have aggravated during surgery.  She denies numbness or tingling.  Not able to raise her left arm at shoulder.  Pain is mainly along the anterior upper arm and shoulder. Objective: Vitals:   01/19/21 0410 01/19/21 0801 01/19/21 1121 01/19/21 1506  BP: (!) 109/42 (!) 136/53 (!) 141/50 (!) 145/49  Pulse: 75 83 83 88  Resp: _0 Temp: 98 F (36.7 C) 98.9 F (37.2 C) 98.7 F (37.1 C) 99.2 F (37.3 C)  TempSrc: Oral Oral Oral Oral  SpO2: 98% 97% 96% 96%  Weight:      Height:       No intake or  output data in the 24 hours ending 01/19/21 1525  Filed Weights   01/16/21 1603  Weight: 54.5 kg    Examination:  GENERAL: No apparent distress.  Nontoxic. HEENT: MMM.  Vision and hearing grossly intact.  NECK: Supple.  No apparent JVD.  RESP:  No IWOB.  Fair aeration bilaterally. CVS:  RRR. Heart sounds normal.  ABD/GI/GU: BS+. Abd soft, NTND.  MSK/EXT:  Bulky dressing over her right foot.  Tenderness along anterior aspect of left shoulder down to upper arm.  No erythema or swelling.  Very limited active ROM.  Passive ROM full. SKIN: no apparent skin lesion or wound.  Bulky dressing over right foot.  No erythema or swelling proximally. NEURO: Awake and alert. Oriented appropriately.  No apparent focal neuro deficit. PSYCH: Calm. Normal affect.   Procedures:  11/16-right TMT amputation  Microbiology summarized: EXHBZ-16 and influenza PCR nonreactive. Blood culture pending.  Assessment & Plan: Diabetic right foot infection-seems to have failed outpatient therapy with p.o. antibiotics.  Exam with ulceration, foul-smelling purulent drainage about first right MTP joint with surrounding erythema swelling and tenderness.  Prior x-ray from 11/7 without osteo per podiatry, Dr. Amalia Hailey. Aortogram on 10/19/20 showed adequate circulation for wound healing at that time.  CRP 9.0.  ESR 50.  Blood cultures NGTD. -S/p right  TMT amputation on 11/16. -Okay to discontinue antibiotics after tonight's dose per orthopedic surgery. -Okay to resume Eliquis tonight per Ortho. -Pain control per Ortho. -Bowel regimen -PT/OT  Left shoulder pain: Likely rotator cuff injury from her attempt to break her fall by holding to the door.  She has full passive range of motion but limited active range of motion.  Low suspicion for osseous process. -Therapy, Voltaren gel, lidocaine and pain meds  History of recurrent C diff infection- -Will be off antibiotic after tonight's dose.  CKD-3B/azotemia: Creatinine  improved. Recent Labs    07/11/20 1147 10/06/20 1216 01/16/21 1642 01/17/21 0520 01/18/21 0132 01/19/21 0743  BUN 49* 44* 44* 40* 36* 20  CREATININE 1.18* 1.26* 1.25* 1.14* 1.30* 1.11*  -Continue monitoring  Mild hyperkalemia: Resolved.  Anemia of chronic disease: H&H relatively stable.  Anemia panel normal Recent Labs    07/11/20 1147 10/06/20 1216 01/16/21 1642 01/17/21 0520 01/18/21 0132 01/19/21 0743  HGB 10.4* 10.0* 10.6* 9.9* 9.3* 9.6*  -Continue monitoring   IDDM-2 with hyperglycemia, hyperlipidemia, neuropathy and diabetic foot ulcer Recent Labs  Lab 01/18/21 2023 01/18/21 2134 01/19/21 0415 01/19/21 0759 01/19/21 1119  GLUCAP 168* 243* 100* 124* 135*  -Continue SSI-sensitive -Continue basal insulin 6 units daily. -Continue statin and Neurontin.   Paroxysmal A. fib: Seems to be in sinus rhythm on exam.  On Coreg and Eliquis at home -Continue home Coreg -Hold Eliquis for surgery   Hypotension/essential HTN: Hypotension resolved.  Normotensive for most part. -Continue home Coreg.   RA: Previously on Enbrel but now receiving monthly infusions of another DMARD; due for next infusion on Monday, 11/21 -Continue home prednisone 4 mg daily -Watch for adrenal insufficiency from chronic steroid use   Goal of care counseling: DNR/DNI which is appropriate.   Increased need of nutrition Body mass index is 22.7 kg/m. Nutrition Problem: Severe Malnutrition Etiology: chronic illness (IBS) Signs/Symptoms: severe muscle depletion, moderate fat depletion, energy intake < or equal to 75% for > or equal to 1 month Interventions: MVI, Liberalize Diet, Ensure Enlive (each supplement provides 350kcal and 20 grams of protein)   DVT prophylaxis:  apixaban (ELIQUIS) tablet 2.5 mg Start: 01/19/21 2200 SCDs Start: 01/18/21 2133 apixaban (ELIQUIS) tablet 2.5 mg  Code Status: DNR/DNI Family Communication: Updated patient's daughter at bedside Level of care:  Med-Surg Status is: Inpatient  Remains inpatient appropriate because: Need for IV antibiotics, IV pain meds for postop pain and postop care    Consultants:  Orthopedic surgery   Sch Meds:  Scheduled Meds:  apixaban  2.5 mg Oral BID   atorvastatin  20 mg Oral QHS   carvedilol  3.125 mg Oral BID WC   cycloSPORINE  1 drop Both Eyes BID   diclofenac Sodium  2 g Topical QID   feeding supplement  237 mL Oral BID BM   feeding supplement  296 mL Oral Once   insulin aspart  0-5 Units Subcutaneous QHS   insulin aspart  0-9 Units Subcutaneous TID WC   insulin glargine-yfgn  6 Units Subcutaneous QHS   lidocaine  1 patch Transdermal Q24H   multivitamin  1 tablet Oral QHS   pantoprazole  40 mg Oral Daily   predniSONE  4 mg Oral q AM   saccharomyces boulardii  250 mg Oral BID   Continuous Infusions:  sodium chloride 50 mL/hr at 01/16/21 1834   0.9 % NaCl with KCl 20 mEq / L 50 mL/hr at 01/18/21 2231   metronidazole 500 mg (01/19/21 1458)  vancomycin     PRN Meds:.acetaminophen **OR** acetaminophen, bisacodyl, gabapentin, hydrALAZINE, morphine injection, oxyCODONE, polyethylene glycol, traMADol  Antimicrobials: Anti-infectives (From admission, onward)    Start     Dose/Rate Route Frequency Ordered Stop   01/18/21 2020  vancomycin (VANCOCIN) powder  Status:  Discontinued          As needed 01/18/21 2021 01/18/21 2021   01/18/21 1723  vancomycin (VANCOCIN) 1-5 GM/200ML-% IVPB       Note to Pharmacy: Gregery Na   : cabinet override      01/18/21 1723 01/18/21 2032   01/18/21 1700  vancomycin (VANCOREADY) IVPB 1000 mg/200 mL        1,000 mg 200 mL/hr over 60 Minutes Intravenous Every 48 hours 01/16/21 1804 01/19/21 2000   01/18/21 0600  ceFAZolin (ANCEF) IVPB 2g/100 mL premix  Status:  Discontinued        2 g 200 mL/hr over 30 Minutes Intravenous On call to O.R. 01/17/21 2005 01/18/21 2124   01/17/21 1100  ceFEPIme (MAXIPIME) 1 g in sodium chloride 0.9 % 100 mL IVPB        1  g 200 mL/hr over 30 Minutes Intravenous Every 12 hours 01/17/21 1011 01/19/21 0933   01/16/21 1700  cefTRIAXone (ROCEPHIN) 2 g in sodium chloride 0.9 % 100 mL IVPB  Status:  Discontinued        2 g 200 mL/hr over 30 Minutes Intravenous Every 24 hours 01/16/21 1607 01/17/21 0953   01/16/21 1700  metroNIDAZOLE (FLAGYL) IVPB 500 mg        500 mg 100 mL/hr over 60 Minutes Intravenous Every 8 hours 01/16/21 1607 01/19/21 2000   01/16/21 1700  vancomycin (VANCOREADY) IVPB 1250 mg/250 mL        1,250 mg 166.7 mL/hr over 90 Minutes Intravenous  Once 01/16/21 1614 01/16/21 2324        I have personally reviewed the following labs and images: CBC: Recent Labs  Lab 01/16/21 1642 01/17/21 0520 01/18/21 0132 01/19/21 0743  WBC 10.1 7.8 7.0 9.3  NEUTROABS 8.1*  --   --   --   HGB 10.6* 9.9* 9.3* 9.6*  HCT 32.4* 30.1* 29.1* 30.3*  MCV 93.1 92.6 93.6 93.2  PLT 269 244 244 238   BMP &GFR Recent Labs  Lab 01/16/21 1642 01/17/21 0520 01/18/21 0132 01/19/21 0743  NA 139 142 139 140  K 5.2* 4.4 4.2 4.2  CL 103 107 108 109  CO2 27 26 21* 23  GLUCOSE 321* 117* 183* 123*  BUN 44* 40* 36* 20  CREATININE 1.25* 1.14* 1.30* 1.11*  CALCIUM 8.8* 8.2* 8.1* 8.0*  MG  --   --  1.9 1.7  PHOS  --   --  3.7 3.1   Estimated Creatinine Clearance: 28 mL/min (A) (by C-G formula based on SCr of 1.11 mg/dL (H)). Liver & Pancreas: Recent Labs  Lab 01/16/21 1642 01/18/21 0132 01/19/21 0743  AST 17  --   --   ALT 18  --   --   ALKPHOS 55  --   --   BILITOT 0.6  --   --   PROT 6.0*  --   --   ALBUMIN 2.9* 2.4* 2.3*   No results for input(s): LIPASE, AMYLASE in the last 168 hours. No results for input(s): AMMONIA in the last 168 hours. Diabetic: Recent Labs    01/18/21 0132  HGBA1C 7.9*   Recent Labs  Lab 01/18/21 2023 01/18/21 2134 01/19/21 0415 01/19/21  0759 01/19/21 1119  GLUCAP 168* 243* 100* 124* 135*   Cardiac Enzymes: No results for input(s): CKTOTAL, CKMB, CKMBINDEX,  TROPONINI in the last 168 hours. No results for input(s): PROBNP in the last 8760 hours. Coagulation Profile: No results for input(s): INR, PROTIME in the last 168 hours. Thyroid Function Tests: No results for input(s): TSH, T4TOTAL, FREET4, T3FREE, THYROIDAB in the last 72 hours. Lipid Profile: Recent Labs    01/18/21 0132  CHOL 102  HDL 36*  LDLCALC 42  TRIG 121  CHOLHDL 2.8   Anemia Panel: Recent Labs    01/18/21 0132  VITAMINB12 525  FOLATE 36.6  FERRITIN 52  TIBC 200*  IRON 71  RETICCTPCT 1.7   Urine analysis:    Component Value Date/Time   COLORURINE YELLOW 07/31/2018 0516   APPEARANCEUR HAZY (A) 07/31/2018 0516   LABSPEC 1.011 07/31/2018 0516   PHURINE 5.0 07/31/2018 0516   GLUCOSEU NEGATIVE 07/31/2018 0516   HGBUR SMALL (A) 07/31/2018 0516   BILIRUBINUR NEGATIVE 07/31/2018 0516   KETONESUR NEGATIVE 07/31/2018 0516   PROTEINUR NEGATIVE 07/31/2018 0516   NITRITE NEGATIVE 07/31/2018 0516   LEUKOCYTESUR NEGATIVE 07/31/2018 0516   Sepsis Labs: Invalid input(s): PROCALCITONIN, [place]  Microbiology: Recent Results (from the past 240 hour(s))  Blood Cultures x 2 sites     Status: None (Preliminary result)   Collection Time: 01/16/21  4:42 PM   Specimen: BLOOD  Result Value Ref Range Status   Specimen Description BLOOD LEFT ANTECUBITAL  Final   Special Requests   Final    BOTTLES DRAWN AEROBIC ONLY Blood Culture adequate volume   Culture   Final    NO GROWTH 2 DAYS Performed at [facility], [address] [place], [place] [address]    Report Status PENDING  Incomplete  Blood Cultures x 2 sites     Status: None (Preliminary result)   Collection Time: 01/16/21  4:42 PM   Specimen: BLOOD LEFT ARM  Result Value Ref Range Status   Specimen Description BLOOD LEFT ARM  Final   Special Requests   Final    BOTTLES DRAWN AEROBIC ONLY Blood Culture adequate volume   Culture   Final    NO GROWTH 2 DAYS Performed at [facility], [place]  [address] [place], [place] [address]    Report Status PENDING  Incomplete  Resp Panel by RT-PCR (Flu A&B, Covid) Nasopharyngeal Swab     Status: None   Collection Time: 01/16/21  5:12 PM   Specimen: Nasopharyngeal Swab; Nasopharyngeal(NP) swabs in vial transport medium  Result Value Ref Range Status   SARS Coronavirus 2 by RT PCR NEGATIVE NEGATIVE Final    Comment: (NOTE) SARS-CoV-2 target nucleic acids are NOT DETECTED.  The SARS-CoV-2 RNA is generally detectable in upper respiratory specimens during the acute phase of infection. The lowest concentration of SARS-CoV-2 viral copies this assay can detect is 138 copies/mL. A negative result does not preclude SARS-Cov-2 infection and should not be used as the sole basis for treatment or other patient management decisions. A negative result may occur with  improper specimen collection/handling, submission of specimen other than nasopharyngeal swab, presence of viral mutation(s) within the areas targeted by this assay, and inadequate number of viral copies(<138 copies/mL). A negative result must be combined with clinical observations, patient history, and epidemiological information. The expected result is Negative.  Fact Sheet for Patients:  EntrepreneurPulse.com.au  Fact Sheet for Healthcare Providers:  IncredibleEmployment.be  This test is no t yet  approved or cleared by the [place] and  has been authorized for detection and/or diagnosis of SARS-CoV-2 by FDA under an Emergency Use Authorization (EUA). This EUA will remain  in effect (meaning this test can be used) for the duration of the COVID-19 declaration under Section 564(b)(1) of the Act, 21 U.S.C.section 360bbb-3(b)(1), unless the authorization is terminated  or revoked sooner.       Influenza A by PCR NEGATIVE NEGATIVE Final   Influenza B by PCR NEGATIVE NEGATIVE Final    Comment: (NOTE) The Xpert Xpress SARS-CoV-2/FLU/RSV plus  assay is intended as an aid in the diagnosis of influenza from Nasopharyngeal swab specimens and should not be used as a sole basis for treatment. Nasal washings and aspirates are unacceptable for Xpert Xpress SARS-CoV-2/FLU/RSV testing.  Fact Sheet for Patients: EntrepreneurPulse.com.au  Fact Sheet for Healthcare Providers: IncredibleEmployment.be  This test is not yet approved or cleared by the [place] FDA and has been authorized for detection and/or diagnosis of SARS-CoV-2 by FDA under an Emergency Use Authorization (EUA). This EUA will remain in effect (meaning this test can be used) for the duration of the COVID-19 declaration under Section 564(b)(1) of the Act, 21 U.S.C. section 360bbb-3(b)(1), unless the authorization is terminated or revoked.  Performed at [facility], [place] [address] [place], [place] [address]     Radiology Studies: No results found.    Jacia Sickman T. [place]  If 7PM-7AM, please contact night-coverage [email or web address] 01/19/2021, 3:25 PM

## 2021-01-19 NOTE — Progress Notes (Signed)
Initial Nutrition Assessment  DOCUMENTATION CODES:   Severe malnutrition in context of chronic illness  INTERVENTION:   Continue Renal Multivitamin w/ minerals daily  Continue Ensure Enlive po BID, each supplement provides 350 kcal and 20 grams of protein  Continue chopped meat with all meals.   Recommend liberalizing pt diet to regular. Messaged MD.  Encourage good PO intake  NUTRITION DIAGNOSIS:   Severe Malnutrition related to chronic illness (IBS) as evidenced by severe muscle depletion, moderate fat depletion, energy intake < or equal to 75% for > or equal to 1 month. - Ongoing  GOAL:   Patient will meet greater than or equal to 90% of their needs - Progressing  MONITOR:   PO intake, Supplement acceptance, Labs, Skin  REASON FOR ASSESSMENT:   Consult Wound healing  ASSESSMENT:   87 y.o. female presented to the ED with a diabetic foot ulcer. PMH includes DM, CKD IIIa, HTN, GERD, diverticulitis, and reoccurring C. Diff colitis. Pt admitted with diabetic foot ulcer w/ necrosis and possible amputation.   11/16 - OR for R transmetatarsal amputation  Pt resting in bed, family at bedside.   Pt reports that she is in pain this morning. Was sick last night after surgery and has been feeling nauseous all morning long. Did not want to eat much breakfast due to nausea, was able to drink an Ensure in place. Family very supportive and assisting pt with meal orders and feedings if need be.  Discussed the importance of eating enough calories and protein to promote wound healing.   Medications reviewed and include: SSI 0-5 units daily + 0--9 units TID, Semglee 6 units daily, Protonix, Prednisone, Renal MVI, Florastor, IV antibiotics  Labs reviewed: 24 hr BG trends: 100-243  Diet Order:   Diet Order             Diet Carb Modified Fluid consistency: Thin; Room service appropriate? Yes  Diet effective now                   EDUCATION NEEDS:   No education needs have  been identified at this time  Skin:  Skin Assessment: Skin Integrity Issues: Skin Integrity Issues:: Incisions Diabetic Ulcer: R Foot Incisions: R Foot  Last BM:  01/17/2021  Height:   Ht Readings from Last 1 Encounters:  01/16/21 5\' 1"  (1.549 m)    Weight:   Wt Readings from Last 1 Encounters:  01/16/21 54.5 kg    Ideal Body Weight:  47.7 kg  BMI:  Body mass index is 22.7 kg/m.  Estimated Nutritional Needs:   Kcal:  1600-1800  Protein:  80-95 grams  Fluid:  > 1.6 L    Shelby Walker BS, PLDN Clinical Dietitian See AMiON for contact information.

## 2021-01-20 LAB — GLUCOSE, CAPILLARY
Glucose-Capillary: 117 mg/dL — ABNORMAL HIGH (ref 70–99)
Glucose-Capillary: 166 mg/dL — ABNORMAL HIGH (ref 70–99)
Glucose-Capillary: 243 mg/dL — ABNORMAL HIGH (ref 70–99)
Glucose-Capillary: 223 mg/dL — ABNORMAL HIGH (ref 70–99)

## 2021-01-20 MED ORDER — OXYCODONE HCL 5 MG PO TABS: 5.0000 mg | ORAL_TABLET | ORAL | 0 refills | Status: AC | PRN

## 2021-01-20 MED ORDER — SENNA 8.6 MG PO TABS: 2.0000 | ORAL_TABLET | Freq: Two times a day (BID) | ORAL | 0 refills | Status: DC

## 2021-01-20 MED ORDER — CARVEDILOL 6.25 MG PO TABS
6.2500 mg | ORAL_TABLET | Freq: Two times a day (BID) | ORAL | Status: DC
  Administered 2021-01-20 – 2021-01-21 (×2): 6.25 mg via ORAL
  Filled 2021-01-20 (×2): qty 1

## 2021-01-20 MED ORDER — DOCUSATE SODIUM 100 MG PO CAPS: 100.0000 mg | ORAL_CAPSULE | Freq: Two times a day (BID) | ORAL | 0 refills | Status: DC

## 2021-01-20 NOTE — Progress Notes (Signed)
Subjective: 2 Days Post-Op Procedure(s) (LRB): TRANSMETATARSAL AMPUTATION (Right)  Patient reports pain as moderate.  Denies fever, chills, N/V, CP, SOB.  Tolerating POs well.  Admits to flatus  Objective:   VITALS:  Temp:  [98.4 F (36.9 C)-99.2 F (37.3 C)] 98.4 F (36.9 C) (11/18 0523) Pulse Rate:  [80-88] 80 (11/18 0523) Resp:  [17-18] 17 (11/18 0523) BP: (134-156)/(49-64) 134/52 (11/18 0523) SpO2:  [96 %-97 %] 96 % (11/18 0523)  General: WDWN patient in NAD. Psych:  Appropriate mood and affect. Neuro:  A&O x 3, Moving all extremities, sensation intact to light touch HEENT:  EOMs intact Chest:  Even non-labored respirations Skin:  Dressing C/D/I, no rashes or lesions Extremities: warm/dry, no visible edema, erythema or echymosis.  No lymphadenopathy. Pulses: Popliteus 2+ MSK:  ROM: DF of R ankle to neutral, MMT: able to perform quad set   LABS Recent Labs    01/18/21 0132 01/19/21 0743  HGB 9.3* 9.6*  WBC 7.0 9.3  PLT 244 238   Recent Labs    01/18/21 0132 01/19/21 0743  NA 139 140  K 4.2 4.2  CL 108 109  CO2 21* 23  BUN 36* 20  CREATININE 1.30* 1.11*  GLUCOSE 183* 123*   No results for input(s): LABPT, INR in the last 72 hours.   Assessment/Plan: 2 Days Post-Op Procedure(s) (LRB): TRANSMETATARSAL AMPUTATION (Right)  WBAT R LE in CAM boot Up with therapy Reinforce dressing prn D/C prescriptions for pain and bowel regime on chart.  After searching [place] PMP Aware the patient is provided a Rx for oxycodone. Plan for 2 week outpatient post-op visit. Ortho signing off.  Please call with any questions/concerns.  Mechele Claude PA-C [facility] Office:  [phone]

## 2021-01-20 NOTE — Progress Notes (Signed)
Mobility Specialist Progress Note   01/20/21 1600  Mobility  Activity Contraindicated/medical hold   Per request of PT and RN, work with pt when pt is more appropriate for mobility.  Holland Falling Mobility Specialist Phone Number [phone]

## 2021-01-20 NOTE — Progress Notes (Signed)
? ?  Inpatient Rehab Admissions Coordinator : ? ?Per therapy recommendations, patient was screened for CIR candidacy by Samarion Ehle RN MSN.  At this time patient appears to be a potential candidate for CIR. I will place a rehab consult per protocol for full assessment. Please call me with any questions. ? ?Alonzo Loving RN MSN ?Admissions Coordinator ?[phone] ?  ?

## 2021-01-20 NOTE — Progress Notes (Signed)
PROGRESS NOTE  Shelby Walker KZS:[id number] DOB: 30-Dec-1933   PCP: Reynold Bowen, MD  Patient is from: Home.  Lives alone.  DOA: 01/16/2021 LOS: 4  Chief complaints:  Right foot wound    Brief Narrative / Interim history: 87 year old F with PMH of DM-2, RA, CKD-3B, PAD, A. fib on Eliquis, HTN and HOH presenting with right foot wound and erythema with drainage, and admitted for diabetic right foot infection.  Previously, she had an ulcer on the bottom of right foot and took antibiotics and it healed; an aortogram on 10/19/20 showed adequate circulation for wound healing at that time.  She was last seen on 11/7 by Dr. Amalia Hailey for R foot edema/erythema and was thought to have gout; she was given an rx for meloxicam.  This time, she noticed a dorsal foot wound that spontaneously appeared on 11/13, and was apparently necrotic at that time.  She went to the ER and was given Doxy and referred to Dr. Doran Durand who saw her 11/14 and sent her for direct admission.     Patient was started on ceftriaxone, vancomycin and Flagyl. Underwent right TMT on 01/18/2021 by Dr. Doran Durand.  Antibiotics discontinued per recommendation by orthopedic surgery.  Therapy recommended CIR.   Subjective: Seen and examined earlier this morning.  No major events overnight of this morning.  Reports improvement in his surgical site pain and left shoulder pain.  Denies chest pain, dyspnea, GI or UTI symptoms.  Objective: Vitals:   01/19/21 2148 01/20/21 0523 01/20/21 0742 01/20/21 0743  BP: (!) 156/64 (!) 134/52 (!) 156/61 (!) 156/61  Pulse: 85 80  83  Resp: 18 17    Temp: 99.2 F (37.3 C) 98.4 F (36.9 C)  98.6 F (37 C)  TempSrc: Oral Oral  Oral  SpO2: 97% 96%  95%  Weight:      Height:        Intake/Output Summary (Last 24 hours) at 01/20/2021 1442 Last data filed at 01/20/2021 0600 Gross per 24 hour  Intake 2943.19 ml  Output 200 ml  Net 2743.19 ml    Filed Weights   01/16/21 1603  Weight: 54.5 kg     Examination:  GENERAL: No apparent distress.  Nontoxic. HEENT: MMM.  Vision and hearing grossly intact.  NECK: Supple.  No apparent JVD.  RESP: 95% on RA.  No IWOB.  Fair aeration bilaterally. CVS:  RRR. Heart sounds normal.  ABD/GI/GU: BS+. Abd soft, NTND.  MSK/EXT: Bulky dressing over right foot.  Tenderness over anterior aspect of left shoulder.  Seems to have difficulty initiating abduction but able raise further once she gets it up to some level SKIN: no apparent skin lesion or wound NEURO: Awake and alert. Oriented appropriately.  No apparent focal neuro deficit. PSYCH: Calm. Normal affect.   Procedures:  11/16-right TMT amputation  Microbiology summarized: DDUKG-25 and influenza PCR nonreactive. Blood culture NGTD  Assessment & Plan: Diabetic right foot infection-seems to have failed outpatient therapy with p.o. antibiotics.  Exam with ulceration, foul-smelling purulent drainage about first right MTP joint with surrounding erythema swelling and tenderness. Aortogram on 10/19/20 showed adequate circulation for wound healing at that time.  CRP 9.0.  ESR 50.  Blood cultures NGTD. -S/p right TMT amputation on 11/16. -Antibiotics discontinued given source control with surgery. -Pain control per Ortho. -Bowel regimen -Already on Eliquis in regards to VTE prophylaxis. -WBAT on RLE in cam boot per Ortho -PT/OT  Left shoulder pain/rotator cuff injury-she has difficulty initiating abduction but able to  move further once initiated.  No notable swelling or bruising.  Suspect supraspinatus tendon injury. -Therapy, Voltaren gel, lidocaine and pain meds  History of recurrent C diff infection- -Will be off antibiotic after tonight's dose.  CKD-3B/azotemia: Creatinine improved. Recent Labs    07/11/20 1147 10/06/20 1216 01/16/21 1642 01/17/21 0520 01/18/21 0132 01/19/21 0743  BUN 49* 44* 44* 40* 36* 20  CREATININE 1.18* 1.26* 1.25* 1.14* 1.30* 1.11*  -Continue  monitoring  Mild hyperkalemia: Resolved.  Anemia of chronic disease: H&H relatively stable.  Anemia panel normal Recent Labs    07/11/20 1147 10/06/20 1216 01/16/21 1642 01/17/21 0520 01/18/21 0132 01/19/21 0743  HGB 10.4* 10.0* 10.6* 9.9* 9.3* 9.6*  -Continue monitoring   IDDM-2 with hyperglycemia, hyperlipidemia, neuropathy and diabetic foot ulcer Recent Labs  Lab 01/19/21 1119 01/19/21 1641 01/19/21 2148 01/20/21 0728 01/20/21 1130  GLUCAP 135* 241* 181* 117* 166*  -Continue SSI-sensitive -Continue basal insulin 6 units daily. -Continue statin and Neurontin.   Paroxysmal A. fib: Seems to be in sinus rhythm on exam.  On Coreg and Eliquis at home -Continue home Coreg and Eliquis.  Hypotension/essential HTN: Now a little hypotensive. -Increase Coreg to 6.25 mg twice daily   RA: due for next infusion on Monday, 11/21 -Continue home prednisone 4 mg daily -Watch for adrenal insufficiency from chronic steroid use   Goal of care counseling: DNR/DNI which is appropriate.   Increased need of nutrition Body mass index is 22.7 kg/m. Nutrition Problem: Severe Malnutrition Etiology: chronic illness (IBS) Signs/Symptoms: severe muscle depletion, moderate fat depletion, energy intake < or equal to 75% for > or equal to 1 month Interventions: MVI, Liberalize Diet, Ensure Enlive (each supplement provides 350kcal and 20 grams of protein)   DVT prophylaxis:  apixaban (ELIQUIS) tablet 2.5 mg Start: 01/19/21 2200 SCDs Start: 01/18/21 2133 apixaban (ELIQUIS) tablet 2.5 mg  Code Status: DNR/DNI Family Communication: Updated patient's daughter at bedside on 11/17.  None at bedside today. Level of care: Med-Surg Status is: Inpatient  Remains inpatient appropriate because: Safe disposition/CIR    Consultants:  Orthopedic surgery   Sch Meds:  Scheduled Meds:  apixaban  2.5 mg Oral BID   atorvastatin  20 mg Oral QHS   carvedilol  3.125 mg Oral BID WC   cycloSPORINE  1  drop Both Eyes BID   diclofenac Sodium  2 g Topical QID   feeding supplement  237 mL Oral BID BM   feeding supplement  296 mL Oral Once   insulin aspart  0-5 Units Subcutaneous QHS   insulin aspart  0-9 Units Subcutaneous TID WC   insulin glargine-yfgn  6 Units Subcutaneous QHS   lidocaine  1 patch Transdermal Q24H   multivitamin  1 tablet Oral QHS   pantoprazole  40 mg Oral Daily   predniSONE  4 mg Oral q AM   saccharomyces boulardii  250 mg Oral BID   Continuous Infusions:   PRN Meds:.acetaminophen **OR** acetaminophen, bisacodyl, gabapentin, hydrALAZINE, metoCLOPramide (REGLAN) injection, morphine injection, oxyCODONE, polyethylene glycol, traMADol  Antimicrobials: Anti-infectives (From admission, onward)    Start     Dose/Rate Route Frequency Ordered Stop   01/18/21 2020  vancomycin (VANCOCIN) powder  Status:  Discontinued          As needed 01/18/21 2021 01/18/21 2021   01/18/21 1723  vancomycin (VANCOCIN) 1-5 GM/200ML-% IVPB       Note to Pharmacy: Tawanna Sat   : cabinet override      01/18/21 1723 01/18/21 2032  01/18/21 1700  vancomycin (VANCOREADY) IVPB 1000 mg/200 mL        1,000 mg 200 mL/hr over 60 Minutes Intravenous Every 48 hours 01/16/21 1804 01/19/21 2000   01/18/21 0600  ceFAZolin (ANCEF) IVPB 2g/100 mL premix  Status:  Discontinued        2 g 200 mL/hr over 30 Minutes Intravenous On call to O.R. 01/17/21 2005 01/18/21 2124   01/17/21 1100  ceFEPIme (MAXIPIME) 1 g in sodium chloride 0.9 % 100 mL IVPB        1 g 200 mL/hr over 30 Minutes Intravenous Every 12 hours 01/17/21 1011 01/19/21 0933   01/16/21 1700  cefTRIAXone (ROCEPHIN) 2 g in sodium chloride 0.9 % 100 mL IVPB  Status:  Discontinued        2 g 200 mL/hr over 30 Minutes Intravenous Every 24 hours 01/16/21 1607 01/17/21 0953   01/16/21 1700  metroNIDAZOLE (FLAGYL) IVPB 500 mg        500 mg 100 mL/hr over 60 Minutes Intravenous Every 8 hours 01/16/21 1607 01/19/21 1558   01/16/21 1700  vancomycin  (VANCOREADY) IVPB 1250 mg/250 mL        1,250 mg 166.7 mL/hr over 90 Minutes Intravenous  Once 01/16/21 1614 01/16/21 2324        I have personally reviewed the following labs and images: CBC: Recent Labs  Lab 01/16/21 1642 01/17/21 0520 01/18/21 0132 01/19/21 0743  WBC 10.1 7.8 7.0 9.3  NEUTROABS 8.1*  --   --   --   HGB 10.6* 9.9* 9.3* 9.6*  HCT 32.4* 30.1* 29.1* 30.3*  MCV 93.1 92.6 93.6 93.2  PLT 269 244 244 238   BMP &GFR Recent Labs  Lab 01/16/21 1642 01/17/21 0520 01/18/21 0132 01/19/21 0743  NA 139 142 139 140  K 5.2* 4.4 4.2 4.2  CL 103 107 108 109  CO2 27 26 21* 23  GLUCOSE 321* 117* 183* 123*  BUN 44* 40* 36* 20  CREATININE 1.25* 1.14* 1.30* 1.11*  CALCIUM 8.8* 8.2* 8.1* 8.0*  MG  --   --  1.9 1.7  PHOS  --   --  3.7 3.1   Estimated Creatinine Clearance: 28 mL/min (A) (by C-G formula based on SCr of 1.11 mg/dL (H)). Liver & Pancreas: Recent Labs  Lab 01/16/21 1642 01/18/21 0132 01/19/21 0743  AST 17  --   --   ALT 18  --   --   ALKPHOS 55  --   --   BILITOT 0.6  --   --   PROT 6.0*  --   --   ALBUMIN 2.9* 2.4* 2.3*   No results for input(s): LIPASE, AMYLASE in the last 168 hours. No results for input(s): AMMONIA in the last 168 hours. Diabetic: Recent Labs    01/18/21 0132  HGBA1C 7.9*   Recent Labs  Lab 01/19/21 1119 01/19/21 1641 01/19/21 2148 01/20/21 0728 01/20/21 1130  GLUCAP 135* 241* 181* 117* 166*   Cardiac Enzymes: No results for input(s): CKTOTAL, CKMB, CKMBINDEX, TROPONINI in the last 168 hours. No results for input(s): PROBNP in the last 8760 hours. Coagulation Profile: No results for input(s): INR, PROTIME in the last 168 hours. Thyroid Function Tests: No results for input(s): TSH, T4TOTAL, FREET4, T3FREE, THYROIDAB in the last 72 hours. Lipid Profile: Recent Labs    01/18/21 0132  CHOL 102  HDL 36*  LDLCALC 42  TRIG 121  CHOLHDL 2.8   Anemia Panel: Recent Labs    01/18/21  0132  VITAMINB12 525   FOLATE 36.6  FERRITIN 52  TIBC 200*  IRON 71  RETICCTPCT 1.7   Urine analysis:    Component Value Date/Time   COLORURINE YELLOW 07/31/2018 0516   APPEARANCEUR HAZY (A) 07/31/2018 0516   LABSPEC 1.011 07/31/2018 0516   PHURINE 5.0 07/31/2018 0516   GLUCOSEU NEGATIVE 07/31/2018 0516   HGBUR SMALL (A) 07/31/2018 0516   BILIRUBINUR NEGATIVE 07/31/2018 0516   KETONESUR NEGATIVE 07/31/2018 0516   PROTEINUR NEGATIVE 07/31/2018 0516   NITRITE NEGATIVE 07/31/2018 0516   LEUKOCYTESUR NEGATIVE 07/31/2018 0516   Sepsis Labs: Invalid input(s): PROCALCITONIN, [place]  Microbiology: Recent Results (from the past 240 hour(s))  Blood Cultures x 2 sites     Status: None (Preliminary result)   Collection Time: 01/16/21  4:42 PM   Specimen: BLOOD  Result Value Ref Range Status   Specimen Description BLOOD LEFT ANTECUBITAL  Final   Special Requests   Final    BOTTLES DRAWN AEROBIC ONLY Blood Culture adequate volume   Culture   Final    NO GROWTH 4 DAYS Performed at [facility], [address], [place], [place] [address]    Report Status PENDING  Incomplete  Blood Cultures x 2 sites     Status: None (Preliminary result)   Collection Time: 01/16/21  4:42 PM   Specimen: BLOOD LEFT ARM  Result Value Ref Range Status   Specimen Description BLOOD LEFT ARM  Final   Special Requests   Final    BOTTLES DRAWN AEROBIC ONLY Blood Culture adequate volume   Culture   Final    NO GROWTH 4 DAYS Performed at [facility], [place] [address] [place], [place] [address]    Report Status PENDING  Incomplete  Resp Panel by RT-PCR (Flu A&B, Covid) Nasopharyngeal Swab     Status: None   Collection Time: 01/16/21  5:12 PM   Specimen: Nasopharyngeal Swab; Nasopharyngeal(NP) swabs in vial transport medium  Result Value Ref Range Status   SARS Coronavirus 2 by RT PCR NEGATIVE NEGATIVE Final    Comment: (NOTE) SARS-CoV-2 target nucleic acids are NOT DETECTED.  The SARS-CoV-2 RNA is generally  detectable in upper respiratory specimens during the acute phase of infection. The lowest concentration of SARS-CoV-2 viral copies this assay can detect is 138 copies/mL. A negative result does not preclude SARS-Cov-2 infection and should not be used as the sole basis for treatment or other patient management decisions. A negative result may occur with  improper specimen collection/handling, submission of specimen other than nasopharyngeal swab, presence of viral mutation(s) within the areas targeted by this assay, and inadequate number of viral copies(<138 copies/mL). A negative result must be combined with clinical observations, patient history, and epidemiological information. The expected result is Negative.  Fact Sheet for Patients:  EntrepreneurPulse.com.au  Fact Sheet for Healthcare Providers:  IncredibleEmployment.be  This test is no t yet approved or cleared by the [place] FDA and  has been authorized for detection and/or diagnosis of SARS-CoV-2 by FDA under an Emergency Use Authorization (EUA). This EUA will remain  in effect (meaning this test can be used) for the duration of the COVID-19 declaration under Section 564(b)(1) of the Act, 21 U.S.C.section 360bbb-3(b)(1), unless the authorization is terminated  or revoked sooner.       Influenza A by PCR NEGATIVE NEGATIVE Final   Influenza B by PCR NEGATIVE NEGATIVE Final    Comment: (NOTE) The Xpert Xpress SARS-CoV-2/FLU/RSV plus assay is intended as  an aid in the diagnosis of influenza from Nasopharyngeal swab specimens and should not be used as a sole basis for treatment. Nasal washings and aspirates are unacceptable for Xpert Xpress SARS-CoV-2/FLU/RSV testing.  Fact Sheet for Patients: EntrepreneurPulse.com.au  Fact Sheet for Healthcare Providers: IncredibleEmployment.be  This test is not yet approved or cleared by the [place] FDA  and has been authorized for detection and/or diagnosis of SARS-CoV-2 by FDA under an Emergency Use Authorization (EUA). This EUA will remain in effect (meaning this test can be used) for the duration of the COVID-19 declaration under Section 564(b)(1) of the Act, 21 U.S.C. section 360bbb-3(b)(1), unless the authorization is terminated or revoked.  Performed at [facility], [place] [address], [place], [place] [address]     Radiology Studies: No results found.    Brylyn Novakovich T. Grindstone  If 7PM-7AM, please contact night-coverage [email or web address] 01/20/2021, 2:42 PM

## 2021-01-20 NOTE — Evaluation (Addendum)
Occupational Therapy Evaluation Patient Details Name: Shelby Walker MRN: [id number] DOB: April 19, 1933 Today's Date: 01/20/2021   History of Present Illness 87 yo female s/p R transmetatarsal amputation on 11/16 due to gangrene. PMH includes cdiff, DM, GERD, osteoporosis, RA.   Clinical Impression   At baseline, pt independent with ADLs, receives assistance from family with household cleaning and meal prep. Pt reports using cane for mobility, more recently a RW due to progressive foot pain. Pt min-max A for ADLs, generalized weakness observed in LLE during transfers requiring mod A. Pt reported increased pain, nausea, and dizziness during session, BP taken sitting unsupported EOB (131/68), after BSC transfer (148/81), and after chair transfer (140/86), nurse notified. Pt supervision for bed mobility, requires increased time to move RLE to EOB. Pt motivated to work with therapy and return to PLOF. Pt presenting with impaired balance, activity tolerance, strength, and ROM at this time, will continue to follow acutely to maximize safety with ADLs. Recommend CIR at d/c.     Recommendations for follow up therapy are one component of a multi-disciplinary discharge planning process, led by the attending physician.  Recommendations may be updated based on patient status, additional functional criteria and insurance authorization.   Follow Up Recommendations  Acute inpatient rehab (3hours/day)    Assistance Recommended at Discharge Intermittent Supervision/Assistance  Functional Status Assessment  Patient has had a recent decline in their functional status and demonstrates the ability to make significant improvements in function in a reasonable and predictable amount of time.  Equipment Recommendations  None recommended by OT (will reassess next session)    Recommendations for Other Services PT consult     Precautions / Restrictions Precautions Precautions: Fall Required Braces or Orthoses: Other  Brace Other Brace: CAM boot when OOB and WB RLE Restrictions Weight Bearing Restrictions: Yes RLE Weight Bearing: Non weight bearing      Mobility Bed Mobility Overal bed mobility: Needs Assistance Bed Mobility: Supine to Sit     Supine to sit: Supervision;HOB elevated          Transfers Overall transfer level: Needs assistance Equipment used: 1 person hand held assist Transfers: Bed to chair/wheelchair/BSC   Stand pivot transfers: Mod assist         General transfer comment: pt performed stand pivot transfer x2 with NWB RLE      Balance Overall balance assessment: Needs assistance Sitting-balance support: No upper extremity supported Sitting balance-Leahy Scale: Fair     Standing balance support: Bilateral upper extremity supported;During functional activity Standing balance-Leahy Scale: Poor                             ADL either performed or assessed with clinical judgement   ADL Overall ADL's : Needs assistance/impaired Eating/Feeding: Set up;Sitting   Grooming: Set up;Sitting   Upper Body Bathing: Minimal assistance;Sitting   Lower Body Bathing: Minimal assistance;Sitting/lateral leans   Upper Body Dressing : Minimal assistance;Sitting   Lower Body Dressing: Sitting/lateral leans;Moderate assistance   Toilet Transfer: Maximal assistance;BSC/3in1   Toileting- Clothing Manipulation and Hygiene: Moderate assistance;Sitting/lateral lean       Functional mobility during ADLs: Moderate assistance;Minimal assistance General ADL Comments: pt dizzy and nauseaus during session, RN aware     Vision   Vision Assessment?: No apparent visual deficits     Perception     Praxis      Pertinent Vitals/Pain Pain Assessment: 0-10 Pain Score: 5  Pain Location: R foot Pain  Descriptors / Indicators: Constant;Discomfort Pain Intervention(s): Limited activity within patient's tolerance;Monitored during session;Premedicated before  session;Repositioned     Hand Dominance     Extremity/Trunk Assessment Upper Extremity Assessment Upper Extremity Assessment: Overall WFL for tasks assessed   Lower Extremity Assessment Lower Extremity Assessment: Defer to PT evaluation   Cervical / Trunk Assessment Cervical / Trunk Assessment: Normal   Communication Communication Communication: No difficulties   Cognition Arousal/Alertness: Awake/alert Behavior During Therapy: WFL for tasks assessed/performed Overall Cognitive Status: Within Functional Limits for tasks assessed                                       General Comments  family present throughout session    Exercises     Shoulder Instructions      Home Living Family/patient expects to be discharged to:: Private residence Living Arrangements: Alone Available Help at Discharge: Family;Available 24 hours/day Type of Home: House Home Access: Stairs to enter [employer] of Steps: 3 Entrance Stairs-Rails:  (has single step and threshold, bar on L side) Home Layout: Able to live on main level with bedroom/bathroom     Bathroom Shower/Tub: Occupational psychologist: Handicapped height     Home Equipment: Conservation officer, nature (2 wheels);Shower seat   Additional Comments: used cane intemittently, has used RW more recently due to foot pain      Prior Functioning/Environment Prior Level of Function : Needs assist               ADLs Comments: family helps with larger meals, and cleaning        OT Problem List: Decreased strength;Decreased range of motion;Decreased activity tolerance;Impaired balance (sitting and/or standing);Pain      OT Treatment/Interventions: Therapeutic exercise;Self-care/ADL training;Therapeutic activities;DME and/or AE instruction;Patient/family education    OT Goals(Current goals can be found in the care plan section) Acute Rehab OT Goals Patient Stated Goal: return home OT Goal Formulation:  With patient/family Time For Goal Achievement: 02/03/21 Potential to Achieve Goals: Good ADL Goals Pt Will Perform Upper Body Dressing: sitting;with supervision Pt Will Perform Lower Body Dressing: with min guard assist;sitting/lateral leans Pt Will Transfer to Toilet: with min guard assist;bedside commode Pt Will Perform Toileting - Clothing Manipulation and hygiene: sitting/lateral leans;with supervision  OT Frequency: Min 2X/week   Barriers to D/C:            Co-evaluation              AM-PAC OT "6 Clicks" Daily Activity     Outcome Measure Help from another person eating meals?: None Help from another person taking care of personal grooming?: None Help from another person toileting, which includes using toliet, bedpan, or urinal?: A Little Help from another person bathing (including washing, rinsing, drying)?: A Lot Help from another person to put on and taking off regular upper body clothing?: A Little Help from another person to put on and taking off regular lower body clothing?: A Lot 6 Click Score: 18   End of Session Equipment Utilized During Treatment: Gait belt Nurse Communication: Mobility status;Patient requests pain meds (requesting meds for dizziness)  Activity Tolerance: Patient limited by pain Patient left: in chair;with call bell/phone within reach;with chair alarm set;with family/visitor present  OT Visit Diagnosis: Unsteadiness on feet (R26.81);Other abnormalities of gait and mobility (R26.89);Muscle weakness (generalized) (M62.81);Pain  Time: 1165-7903 OT Time Calculation (min): 42 min Charges:  OT General Charges $OT Visit: 1 Visit OT Evaluation $OT Eval Low Complexity: 1 Low OT Treatments $Self Care/Home Management : 23-37 mins  Lynnda Child, OTD, OTR/L Acute Rehab [phone]) [phone] [place] 01/20/2021, 12:30 PM

## 2021-01-20 NOTE — Progress Notes (Addendum)
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 PMH includes cdiff, DM, GERD, osteoporosis, RA.    PT Comments    Pt up in chair upon arrival to room, motivated to initiate gait training with CAM walker boot. CAM boot is very heavy for pt, and pt complaining of increased pain with it donned. Pt able to take x2 steps  forward with antalgic gait, then began sitting down as if in front of bed but was not there yet, requiring PT to provide knee and max assist to prevent fall. PT discussed different RLE protection options with PA Mechele Claude, he states via secure chat "If she goes in to a post op shoe she'll need to keep her weight back on her heel". PA placed order for postop shoe, will try next session. Given pt's independence PTA and motivation to return to baseline, PT recommending CIR consult.     Recommendations for follow up therapy are one component of a multi-disciplinary discharge planning process, led by the attending physician.  Recommendations may be updated based on patient status, additional functional criteria and insurance authorization.  Follow Up Recommendations  Acute inpatient rehab (3hours/day)     Assistance Recommended at Discharge Frequent or constant Supervision/Assistance  Equipment Recommendations  None recommended by PT    Recommendations for Other Services       Precautions / Restrictions Precautions Precautions: Fall Required Braces or Orthoses: Other Brace Other Brace: CAM boot when OOB and when WB RLE Restrictions Weight Bearing Restrictions: Yes RLE Weight Bearing: Weight bearing as tolerated (in CAM boot)     Mobility  Bed Mobility Overal bed mobility: Needs Assistance Bed Mobility: Sit to Supine     Supine to sit: Supervision;HOB elevated Sit to supine: HOB elevated;Mod assist   General  bed mobility comments: assist for RLE lift into bed, boost up in bed with bed pads.    Transfers Overall transfer level: Needs assistance Equipment used: Rolling walker (2 wheels) Transfers: Bed to chair/wheelchair/BSC   Stand pivot transfers: Mod assist   Step pivot transfers: Mod assist;Max assist     General transfer comment: mod assist for truncal support during x2 antalgic steps forward from recliner to bed, pt with very painful RLE in WB in CAM boot lifting RLE off the ground and attempted to sit on bed when not at EOB yet. Pt required max assist to bring pt to PT knee and then transferred pt from PT knee to EOB to prevent fall.    Ambulation/Gait                   Stairs             Wheelchair Mobility    Modified Rankin (Stroke Patients Only)       Balance Overall balance assessment: Needs assistance Sitting-balance support: No upper extremity supported Sitting balance-Leahy Scale: Fair     Standing balance support: Bilateral upper extremity supported;During functional activity;Reliant on assistive device for balance Standing balance-Leahy Scale: Poor Standing balance comment: reliant on external support                            Cognition Arousal/Alertness: Awake/alert Behavior During Therapy: WFL for tasks assessed/performed Overall Cognitive Status: Impaired/Different from baseline Area of Impairment: Safety/judgement  Safety/Judgement: Decreased awareness of safety;Decreased awareness of deficits     General Comments: requires safety cues throughout mobility        Exercises      General Comments General comments (skin integrity, edema, etc.): pt's daughter and father in law present, interested in CIR program      Pertinent Vitals/Pain Pain Assessment: 0-10 Pain Score: 6  Pain Location: R foot Pain Descriptors / Indicators: Constant;Discomfort;Sore;Moaning Pain Intervention(s):  Limited activity within patient's tolerance;Monitored during session;Patient requesting pain meds-RN notified    Home Living Family/patient expects to be discharged to:: Private residence Living Arrangements: Alone Available Help at Discharge: Family;Available 24 hours/day Type of Home: House Home Access: Stairs to enter Entrance Stairs-Rails:  (has single step and threshold, bar on L side) Entrance Stairs-Number of Steps: 3   Home Layout: Able to live on main level with bedroom/bathroom Home Equipment: Rolling Walker (2 wheels);Shower seat Additional Comments: used cane intemittently, has used RW more recently due to foot pain    Prior Function            PT Goals (current goals can now be found in the care plan section) Acute Rehab PT Goals PT Goal Formulation: With patient Time For Goal Achievement: 02/02/21 Potential to Achieve Goals: Good Progress towards PT goals: Progressing toward goals    Frequency    Min 4X/week      PT Plan Discharge plan needs to be updated    Co-evaluation              AM-PAC PT "6 Clicks" Mobility   Outcome Measure  Help needed turning from your back to your side while in a flat bed without using bedrails?: A Little Help needed moving from lying on your back to sitting on the side of a flat bed without using bedrails?: A Little Help needed moving to and from a bed to a chair (including a wheelchair)?: A Lot Help needed standing up from a chair using your arms (e.g., wheelchair or bedside chair)?: A Lot Help needed to walk in hospital room?: A Lot Help needed climbing 3-5 steps with a railing? : Total 6 Click Score: 13    End of Session Equipment Utilized During Treatment: Other (comment) (R CAM boot) Activity Tolerance: Patient limited by pain;Patient limited by fatigue Patient left: in bed;with bed alarm set;with call bell/phone within reach;with family/visitor present Nurse Communication: Mobility status PT Visit Diagnosis:  Other abnormalities of gait and mobility (R26.89);Pain Pain - Right/Left: Right Pain - part of body: Ankle and joints of foot     Time: 1311-1336 PT Time Calculation (min) (ACUTE ONLY): 25 min  Charges:  $Therapeutic Activity: 23-37 mins                    Stacie Glaze, PT DPT Acute Rehabilitation Services Pager [phone]  Office [phone]   Roxine Caddy E Ruffin Pyo 01/20/2021, 2:36 PM

## 2021-01-21 LAB — GLUCOSE, CAPILLARY
Glucose-Capillary: 166 mg/dL — ABNORMAL HIGH (ref 70–99)
Glucose-Capillary: 291 mg/dL — ABNORMAL HIGH (ref 70–99)
Glucose-Capillary: 149 mg/dL — ABNORMAL HIGH (ref 70–99)
Glucose-Capillary: 174 mg/dL — ABNORMAL HIGH (ref 70–99)

## 2021-01-21 LAB — CULTURE, BLOOD (ROUTINE X 2)
Culture: NO GROWTH
Culture: NO GROWTH
Special Requests: ADEQUATE
Special Requests: ADEQUATE

## 2021-01-21 IMAGING — US ULTRASOUND ABDOMEN LIMITED
1 series · 14 of 25 positions shown · non-contrast
Comparison: CT 07/24/2018

CLINICAL DATA: Anasarca

EXAM:
ULTRASOUND ABDOMEN LIMITED RIGHT UPPER QUADRANT

[Series 1: ultrasound abdomen limited · 14 of 43 slices shown]
[im 1/43]
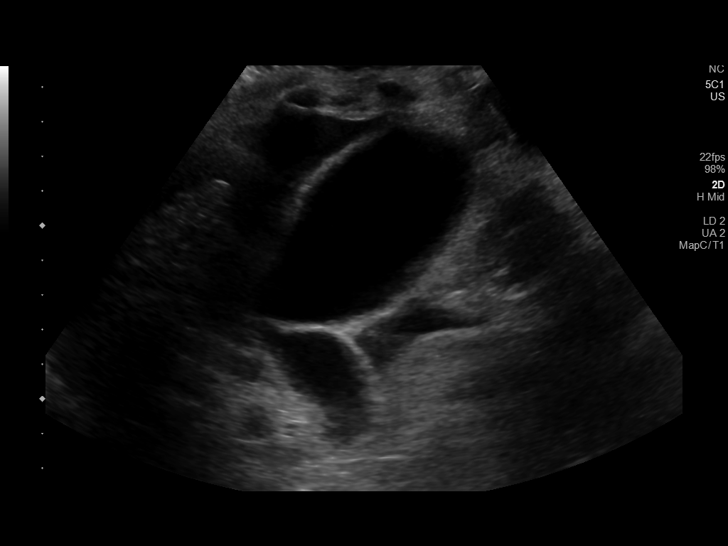
[im 4/43]
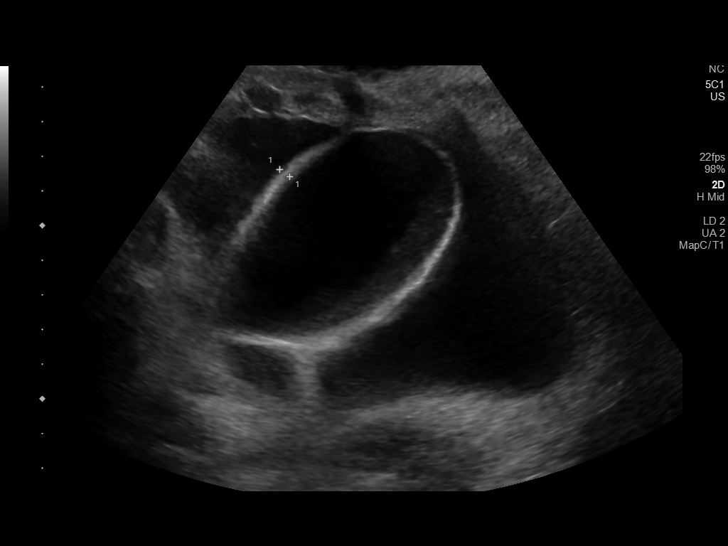
[im 8/43]
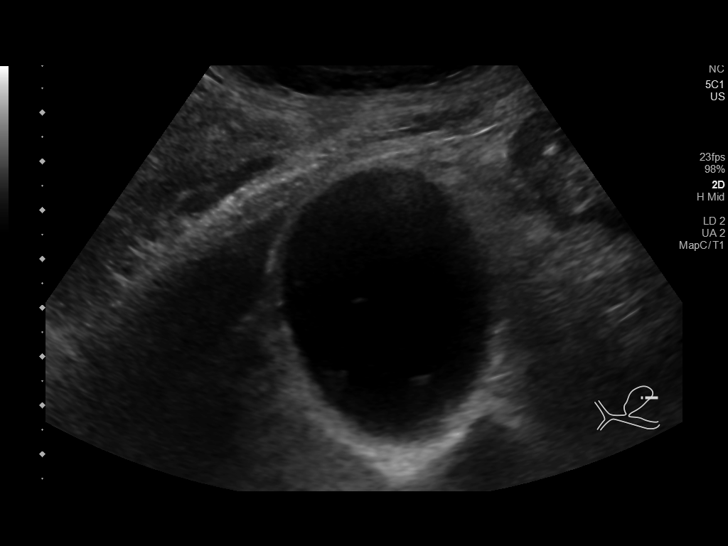
[im 11/43]
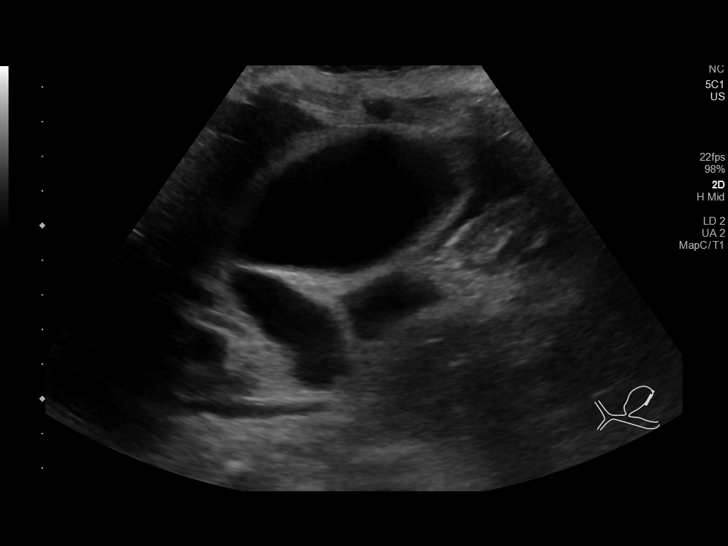
[im 15/43]
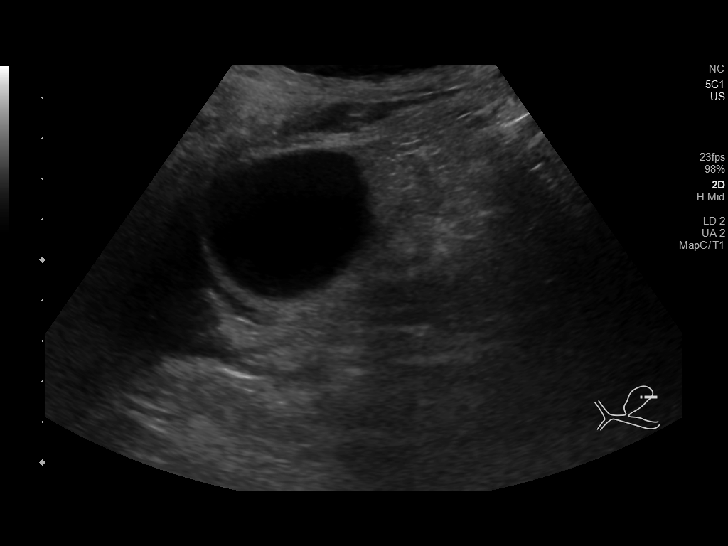
[im 16/43]
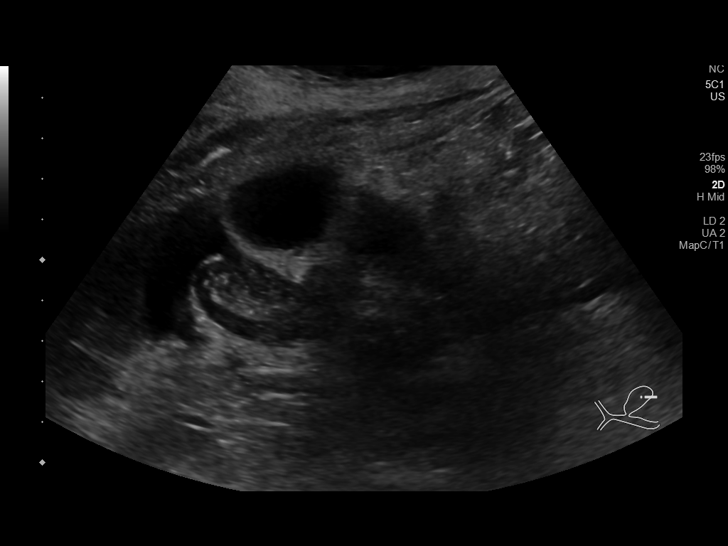
[im 20/43]
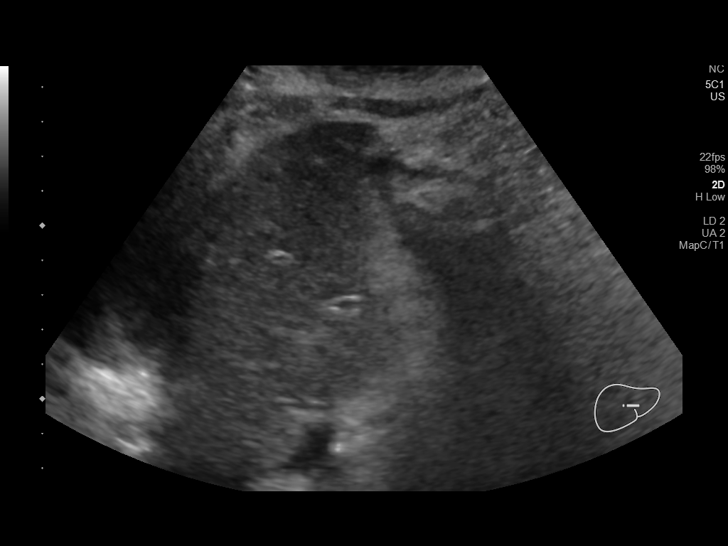
[im 23/43]
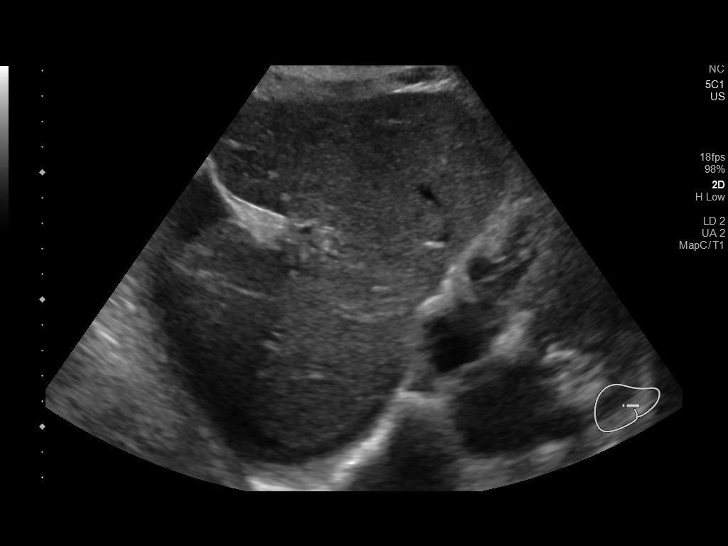
[im 27/43]
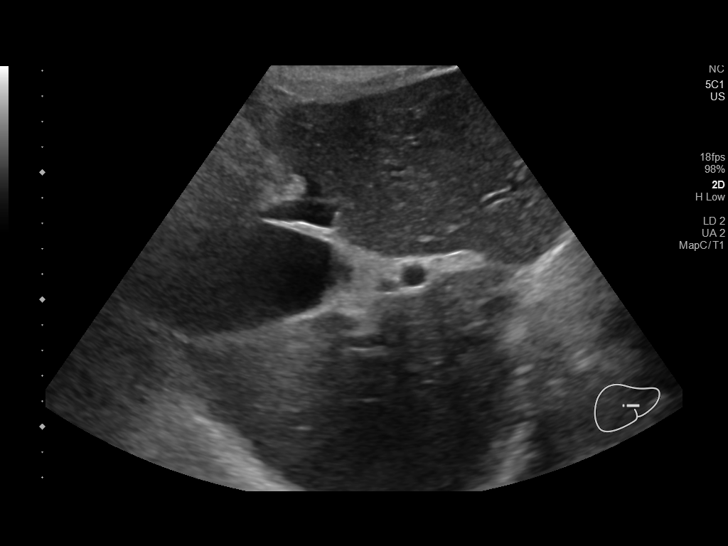
[im 29/43]
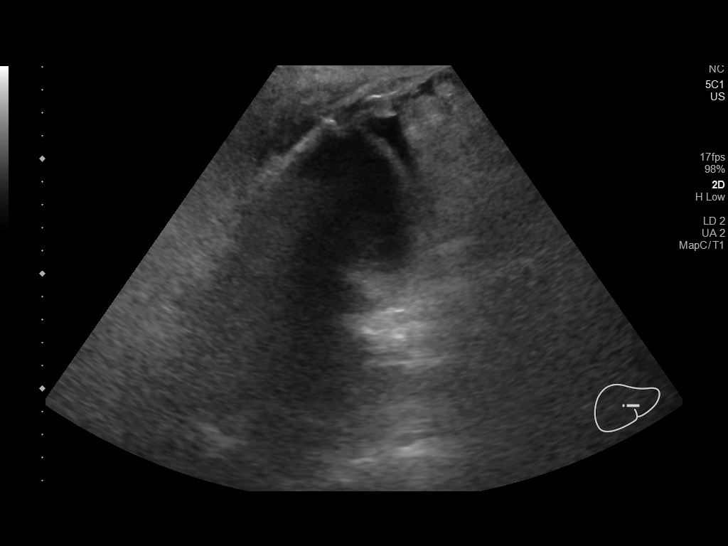
[im 32/43]
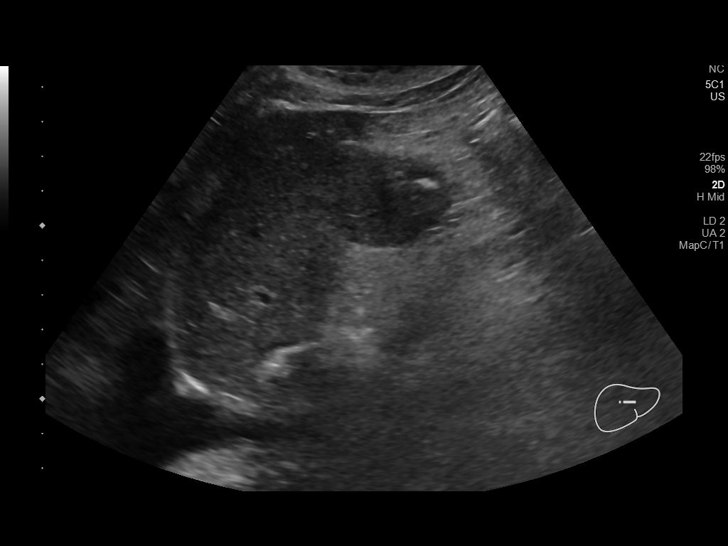
[im 36/43]
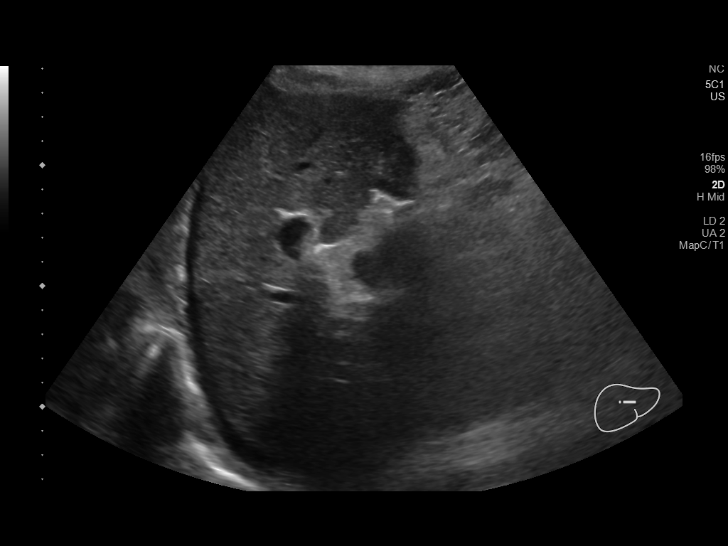
[im 39/43]
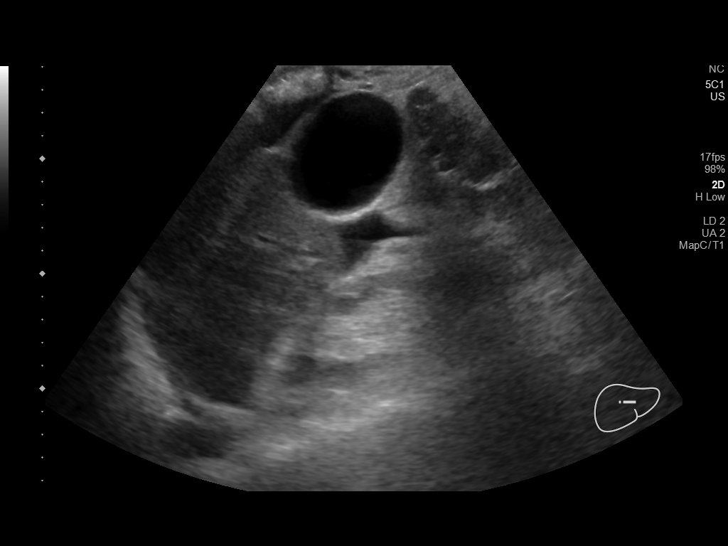
[im 43/43]
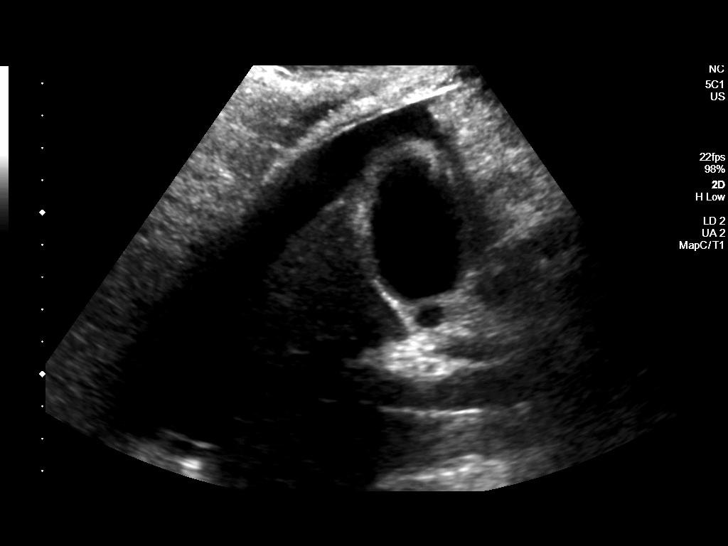

[14 of 25 positions shown; findings below may reference images not displayed]

FINDINGS: Gallbladder:

Gallbladder is distended. Sludge seen within the gallbladder. There
Is mild gallbladder wall thickening, 3-4 mm. No visible stones.

Common bile duct:

Diameter: Normal caliber, 6 mm

Liver:

No focal lesion identified. Within normal limits in parenchymal
echogenicity. Portal vein is patent on color Doppler imaging with
normal direction of blood flow towards the liver.

Perihepatic ascites and ascites noted around the gallbladder.
IMPRESSION: Distended gallbladder with sludge and mild gallbladder wall
thickening. Consider further evaluation with nuclear medicine
hepatobiliary scan if there is concern of cystic duct patency.

Perihepatic ascites.

## 2021-01-21 MED ORDER — LIP MEDEX EX OINT
TOPICAL_OINTMENT | CUTANEOUS | Status: DC | PRN
  Administered 2021-01-21: 75 via TOPICAL
  Filled 2021-01-21: qty 7

## 2021-01-21 MED ORDER — INSULIN ASPART 100 UNIT/ML IJ SOLN
2.0000 [IU] | Freq: Three times a day (TID) | INTRAMUSCULAR | Status: DC
  Administered 2021-01-21: 2 [IU] via SUBCUTANEOUS

## 2021-01-21 MED ORDER — INSULIN ASPART 100 UNIT/ML IJ SOLN: 0.0000 [IU] | Freq: Every day | INTRAMUSCULAR | Status: DC

## 2021-01-21 MED ORDER — INSULIN GLARGINE-YFGN 100 UNIT/ML ~~LOC~~ SOLN
8.0000 [IU] | Freq: Every day | SUBCUTANEOUS | Status: DC
  Administered 2021-01-21 – 2021-01-24 (×4): 8 [IU] via SUBCUTANEOUS
  Filled 2021-01-21 (×4): qty 0.08

## 2021-01-21 MED ORDER — CARVEDILOL 3.125 MG PO TABS
3.1250 mg | ORAL_TABLET | Freq: Two times a day (BID) | ORAL | Status: DC
  Administered 2021-01-22 – 2021-01-24 (×5): 3.125 mg via ORAL
  Filled 2021-01-21 (×5): qty 1

## 2021-01-21 MED ORDER — INSULIN ASPART 100 UNIT/ML IJ SOLN
3.0000 [IU] | Freq: Three times a day (TID) | INTRAMUSCULAR | Status: DC
  Administered 2021-01-21 – 2021-01-24 (×9): 3 [IU] via SUBCUTANEOUS

## 2021-01-21 MED ORDER — INSULIN ASPART 100 UNIT/ML IJ SOLN
0.0000 [IU] | Freq: Three times a day (TID) | INTRAMUSCULAR | Status: DC
  Administered 2021-01-21: 1 [IU] via SUBCUTANEOUS
  Administered 2021-01-21: 5 [IU] via SUBCUTANEOUS
  Administered 2021-01-22 – 2021-01-23 (×4): 2 [IU] via SUBCUTANEOUS
  Administered 2021-01-23: 5 [IU] via SUBCUTANEOUS
  Administered 2021-01-23 – 2021-01-24 (×2): 1 [IU] via SUBCUTANEOUS
  Administered 2021-01-24: 2 [IU] via SUBCUTANEOUS

## 2021-01-21 NOTE — Progress Notes (Signed)
Inpatient Rehab Admissions:  Inpatient Rehab Consult received.  I met with patient and son, Shelby Walker at the bedside for rehabilitation assessment and to discuss goals and expectations of an inpatient rehab admission.  Both acknowledged understanding of CIR goals and expectations. Both interested in pt pursuing CIR. Will continue to follow.  Signed: Gayland Curry, [place], [place] Admissions Coordinator [phone]

## 2021-01-21 NOTE — Progress Notes (Signed)
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 PMH includes cdiff, DM, GERD, osteoporosis, RA.   OT comments  Pt is slowly progressing towards OT goals. Remains limited by pain. During session, pt completed grooming with setup and BUE exercises. Tolerated exercise well. Pt reported that she had just got back in bed after sponge bathing while sitting in chair. Pt reported 10/10 pain during activity and declined any OOB/EOB activity with OT as her pain had "just settled down." States that she was able to participate in bathing, but needed assistance due to pain and limited use of LUE.  Noted 10/10 pain when attempting shoulder flexion. Reports she had incident at home 3 weeks when she twisted her arm as she lost balance and has had this pain ever since. Pain decreased to 5/10 with passive flexion. Stated she had full functional use of RUE prior and is now with very limited ability to use functionally. Messaged ortho PA.   Recommendations for follow up therapy are one component of a multi-disciplinary discharge planning process, led by the attending physician.  Recommendations may be updated based on patient status, additional functional criteria and insurance authorization.    Follow Up Recommendations  Acute inpatient rehab (3hours/day)    Assistance Recommended at Discharge Frequent or constant Supervision/Assistance  Equipment Recommendations  None recommended by OT    Recommendations for Other Services      Precautions / Restrictions Precautions Precautions: Fall Required Braces or Orthoses: Other Brace Other Brace: CAM boot when OOB and when WB RLE Restrictions Weight Bearing Restrictions: Yes RLE Weight Bearing: Weight bearing as tolerated       Mobility Bed Mobility                    Transfers                          Balance                                           ADL either performed or assessed with clinical judgement   ADL       Grooming: Wash/dry hands;Wash/dry face;Applying deodorant;Brushing hair;Set up;Bed level                                 General ADL Comments: Pt reported that she had just got back in bed after sponge bathing while sitting in chair. Pt reported 10/10 pain during activity and declined any OOB/EOB activity with OT as her pain had "just settled down." States that she was able to participate in bathing, but needed assistance due to pain and limited use of LUE.    Extremity/Trunk Assessment Upper Extremity Assessment Upper Extremity Assessment: Generalized weakness;LUE deficits/detail LUE Deficits / Details: Noted 10/10 pain when attempting shoulder flexion. Reports she had incident at home 3 weeks when she twisted her arm as she lost balance and has had this pain ever since. Pain decreased to 5/10 with passive flexion. Stated she had full functional use of RUE prior and is now with very limited ability to use functionally. Messaged ortho PA.  Vision       Perception     Praxis      Cognition Arousal/Alertness: Awake/alert Behavior During Therapy: WFL for tasks assessed/performed Overall Cognitive Status: Within Functional Limits for tasks assessed                                            Exercises Exercises: General Upper Extremity General Exercises - Upper Extremity Shoulder Flexion: AROM;Right;10 reps;Supine Shoulder Extension: AROM;Right;10 reps;Supine Elbow Flexion: AROM;Both;10 reps;Supine Elbow Extension: AROM;10 reps;Both;Supine Wrist Flexion: AROM;Both;10 reps;Supine Wrist Extension: AROM;Both;10 reps;Supine Digit Composite Flexion: AROM;Both;10 reps;Supine Composite Extension: AROM;Both;10 reps;Supine   Shoulder Instructions       General Comments       Pertinent Vitals/ Pain       Pain Assessment: 0-10 Pain Score: 8  Pain Location: R foot Pain Descriptors / Indicators: Constant;Discomfort;Sore;Moaning Pain Intervention(s): Limited activity within patient's tolerance;Monitored during session  Home Living   Living Arrangements: Alone Available Help at Discharge: Family;Available 24 hours/day Type of Home: House Home Access: Stairs to enter [employer] of Steps: 1 Entrance Stairs-Rails: Left Home Layout: Able to live on main level with bedroom/bathroom;Laundry or work area in [employer] Shower/Tub: Occupational psychologist: Handicapped height Bathroom Accessibility: Yes (pt thinks a walker may be able to fit in bathroom. She said she has never tried to take one into bathroom.) How Accessible: Accessible via walker            Prior Functioning/Environment              Frequency  Min 2X/week        Progress Toward Goals  OT Goals(current goals can now be found in the care plan section)  Progress towards OT goals: Progressing toward goals  Acute Rehab OT Goals Patient Stated Goal: CIR then home OT Goal Formulation: With patient/family Time For Goal Achievement: 02/03/21 Potential to Achieve Goals: Good ADL Goals Pt Will Perform Upper Body Dressing: sitting;with supervision Pt Will Perform Lower Body Dressing: with min guard assist;sitting/lateral leans Pt Will Transfer to Toilet: with min guard assist;bedside commode Pt Will Perform Toileting - Clothing Manipulation and hygiene: sitting/lateral leans;with supervision  Plan Discharge plan remains appropriate;Frequency remains appropriate    Co-evaluation                 AM-PAC OT "6 Clicks" Daily Activity     Outcome Measure   Help from another person eating meals?: None Help from another person taking care of personal grooming?: None Help from another person toileting, which includes using toliet, bedpan, or  urinal?: A Little Help from another person bathing (including washing, rinsing, drying)?: A Lot Help from another person to put on and taking off regular upper body clothing?: A Little Help from another person to put on and taking off regular lower body clothing?: A Lot 6 Click Score: 18    End of Session    OT Visit Diagnosis: Unsteadiness on feet (R26.81);Other abnormalities of gait and mobility (R26.89);Muscle weakness (generalized) (M62.81);Pain   Activity Tolerance Patient limited by pain   Patient Left in bed;with call bell/phone within reach;with family/visitor present   Nurse Communication Mobility status        Time: 6314-9702 OT Time Calculation (min): 21 min  Charges: OT General Charges $OT Visit: 1 Visit OT Treatments $Therapeutic Exercise: 8-22 mins  Emin Foree  C, OT/L  Acute Rehab [place] 01/21/2021, 5:39 PM

## 2021-01-21 NOTE — Progress Notes (Signed)
Orthopedic Tech Progress Note Patient Details:  Shelby Walker 1934/01/03 [id number]  Ortho Devices Type of Ortho Device: Postop shoe/boot Ortho Device/Splint Location: right Ortho Device/Splint Interventions: Application   Post Interventions Patient Tolerated: Well Instructions Provided: Care of device  [name] 01/21/2021, 1:19 PM

## 2021-01-21 NOTE — Progress Notes (Signed)
PROGRESS NOTE  Shelby Walker EEG:[id number] DOB: Mar 10, 1933   PCP: Adrian Prince, MD  Patient is from: Home.  Lives alone.  DOA: 01/16/2021 LOS: 5  Chief complaints:  Right foot wound    Brief Narrative / Interim history: 87 year old F with PMH of DM-2, RA, CKD-3B, PAD, A. fib on Eliquis, HTN and HOH presenting with right foot wound and erythema with drainage, and admitted for diabetic right foot infection.  Previously, she had an ulcer on the bottom of right foot and took antibiotics and it healed; an aortogram on 10/19/20 showed adequate circulation for wound healing at that time.  She was last seen on 11/7 by Dr. Logan Bores for R foot edema/erythema and was thought to have gout; she was given an rx for meloxicam.  This time, she noticed a dorsal foot wound that spontaneously appeared on 11/13, and was apparently necrotic at that time.  She went to the ER and was given Doxy and referred to Dr. Victorino Dike who saw her 11/14 and sent her for direct admission.     Patient was started on ceftriaxone, vancomycin and Flagyl. Underwent right TMT on 01/18/2021 by Dr. Victorino Dike.  Antibiotics discontinued per recommendation by orthopedic surgery.  Therapy recommended CIR. CIR following.   Subjective: Seen and examined earlier this morning.  No major events overnight of this morning.  Pain fairly controlled.  Able to move her left shoulder more today.  No complaints.  Objective: Vitals:   01/20/21 2143 01/21/21 0635 01/21/21 0800 01/21/21 1445  BP: (!) 115/54 (!) 106/42 (!) 118/55 (!) 112/49  Pulse: 82 78 75 76  Resp: 14 16 17 14   Temp: 99.1 F (37.3 C) 98.7 F (37.1 C) 98.7 F (37.1 C) 98.7 F (37.1 C)  TempSrc: Oral Oral Oral Oral  SpO2: 94% 97% 96% 96%  Weight:      Height:        Intake/Output Summary (Last 24 hours) at 01/21/2021 1544 Last data filed at 01/21/2021 1300 Gross per 24 hour  Intake 240 ml  Output --  Net 240 ml     Filed Weights   01/16/21 1603  Weight: 54.5 kg     Examination:  GENERAL: No apparent distress.  Nontoxic. HEENT: MMM.  Vision and hearing grossly intact.  NECK: Supple.  No apparent JVD.  RESP: 96% on RA.  No IWOB.  Fair aeration bilaterally. CVS:  RRR. Heart sounds normal.  ABD/GI/GU: BS+. Abd soft, NTND.  MSK/EXT:  Moves extremities. No apparent deformity.  Dressing over right fourth DCI. SKIN: Dressing over right foot DCI. NEURO: Awake and alert. Oriented appropriately.  No apparent focal neuro deficit. PSYCH: Calm. Normal affect.   Procedures:  11/16-right TMT amputation  Microbiology summarized: COVID-19 and influenza PCR nonreactive. Blood culture NGTD  Assessment & Plan: Diabetic right foot infection-seems to have failed outpatient therapy with p.o. antibiotics.  Exam with ulceration, foul-smelling purulent drainage about first right MTP joint with surrounding erythema swelling and tenderness. Aortogram on 10/19/20 showed adequate circulation for wound healing at that time.  CRP 9.0.  ESR 50.  Blood cultures NGTD. -S/p right TMT amputation on 11/16. -Antibiotics discontinued given source control with surgery. -Pain control per Ortho. -Bowel regimen -Already on Eliquis in regards to VTE prophylaxis. -WBAT on RLE in cam boot per Ortho -Therapy recommended CIR.  CR following.  Left shoulder pain/rotator cuff injury-suspect supraspinatus tendon injury aggravated perioperatively.  Improving. -Therapy, Voltaren gel, lidocaine and pain meds  History of recurrent C diff infection- -Will be  off antibiotic after tonight's dose.  CKD-3B/azotemia: Creatinine improved. Recent Labs    07/11/20 1147 10/06/20 1216 01/16/21 1642 01/17/21 0520 01/18/21 0132 01/19/21 0743  BUN 49* 44* 44* 40* 36* 20  CREATININE 1.18* 1.26* 1.25* 1.14* 1.30* 1.11*  -Continue monitoring  Mild hyperkalemia: Resolved.  Anemia of chronic disease: H&H relatively stable.  Anemia panel normal Recent Labs    07/11/20 1147 10/06/20 1216  01/16/21 1642 01/17/21 0520 01/18/21 0132 01/19/21 0743  HGB 10.4* 10.0* 10.6* 9.9* 9.3* 9.6*  -Continue monitoring   IDDM-2 with hyperglycemia, hyperlipidemia, neuropathy and diabetic foot ulcer Recent Labs  Lab 01/20/21 1130 01/20/21 1552 01/20/21 2153 01/21/21 0801 01/21/21 1120  GLUCAP 166* 243* 223* 166* 291*  -Continue SSI-sensitive -Increase basal insulin from 6 to 8 units. -Increase NovoLog from 2 to 3 units 3 times daily with meals. -Continue statin and Neurontin.   Paroxysmal A. fib: Seems to be in sinus rhythm on exam.  On Coreg and Eliquis at home -Continue home Coreg and Eliquis.  Hypotension/essential HTN: A little hypotensive. -Decrease Coreg from 6.25 to 3.125 mg twice daily (home dose)   RA: due for next infusion on Monday, 11/21 -Continue home prednisone 4 mg daily -Watch for adrenal insufficiency from chronic steroid use   Goal of care counseling: DNR/DNI which is appropriate.   Increased need of nutrition Body mass index is 22.7 kg/m. Nutrition Problem: Severe Malnutrition Etiology: chronic illness (IBS) Signs/Symptoms: severe muscle depletion, moderate fat depletion, energy intake < or equal to 75% for > or equal to 1 month Interventions: MVI, Liberalize Diet, Ensure Enlive (each supplement provides 350kcal and 20 grams of protein)   DVT prophylaxis:  apixaban (ELIQUIS) tablet 2.5 mg Start: 01/19/21 2200 SCDs Start: 01/18/21 2133 apixaban (ELIQUIS) tablet 2.5 mg  Code Status: DNR/DNI Family Communication: Updated patient's daughter at bedside on 11/17.  None at bedside today. Level of care: Med-Surg Status is: Inpatient  Remains inpatient appropriate because: Safe disposition/CIR    Consultants:  Orthopedic surgery   Sch Meds:  Scheduled Meds:  apixaban  2.5 mg Oral BID   atorvastatin  20 mg Oral QHS   carvedilol  3.125 mg Oral BID WC   cycloSPORINE  1 drop Both Eyes BID   diclofenac Sodium  2 g Topical QID   feeding supplement   237 mL Oral BID BM   feeding supplement  296 mL Oral Once   insulin aspart  0-5 Units Subcutaneous QHS   insulin aspart  0-9 Units Subcutaneous TID WC   insulin aspart  3 Units Subcutaneous TID WC   insulin glargine-yfgn  8 Units Subcutaneous Daily   lidocaine  1 patch Transdermal Q24H   multivitamin  1 tablet Oral QHS   pantoprazole  40 mg Oral Daily   predniSONE  4 mg Oral q AM   saccharomyces boulardii  250 mg Oral BID   Continuous Infusions:   PRN Meds:.acetaminophen **OR** acetaminophen, bisacodyl, gabapentin, hydrALAZINE, lip balm, metoCLOPramide (REGLAN) injection, morphine injection, oxyCODONE, polyethylene glycol, traMADol  Antimicrobials: Anti-infectives (From admission, onward)    Start     Dose/Rate Route Frequency Ordered Stop   01/18/21 2020  vancomycin (VANCOCIN) powder  Status:  Discontinued          As needed 01/18/21 2021 01/18/21 2021   01/18/21 1723  vancomycin (VANCOCIN) 1-5 GM/200ML-% IVPB       Note to Pharmacy: Gregery Na   : cabinet override      01/18/21 1723 01/18/21 2032   01/18/21 1700  vancomycin (VANCOREADY) IVPB 1000 mg/200 mL        1,000 mg 200 mL/hr over 60 Minutes Intravenous Every 48 hours 01/16/21 1804 01/19/21 2000   01/18/21 0600  ceFAZolin (ANCEF) IVPB 2g/100 mL premix  Status:  Discontinued        2 g 200 mL/hr over 30 Minutes Intravenous On call to O.R. 01/17/21 2005 01/18/21 2124   01/17/21 1100  ceFEPIme (MAXIPIME) 1 g in sodium chloride 0.9 % 100 mL IVPB        1 g 200 mL/hr over 30 Minutes Intravenous Every 12 hours 01/17/21 1011 01/19/21 0933   01/16/21 1700  cefTRIAXone (ROCEPHIN) 2 g in sodium chloride 0.9 % 100 mL IVPB  Status:  Discontinued        2 g 200 mL/hr over 30 Minutes Intravenous Every 24 hours 01/16/21 1607 01/17/21 0953   01/16/21 1700  metroNIDAZOLE (FLAGYL) IVPB 500 mg        500 mg 100 mL/hr over 60 Minutes Intravenous Every 8 hours 01/16/21 1607 01/19/21 1558   01/16/21 1700  vancomycin (VANCOREADY) IVPB  1250 mg/250 mL        1,250 mg 166.7 mL/hr over 90 Minutes Intravenous  Once 01/16/21 1614 01/16/21 2324        I have personally reviewed the following labs and images: CBC: Recent Labs  Lab 01/16/21 1642 01/17/21 0520 01/18/21 0132 01/19/21 0743  WBC 10.1 7.8 7.0 9.3  NEUTROABS 8.1*  --   --   --   HGB 10.6* 9.9* 9.3* 9.6*  HCT 32.4* 30.1* 29.1* 30.3*  MCV 93.1 92.6 93.6 93.2  PLT 269 244 244 238   BMP &GFR Recent Labs  Lab 01/16/21 1642 01/17/21 0520 01/18/21 0132 01/19/21 0743  NA 139 142 139 140  K 5.2* 4.4 4.2 4.2  CL 103 107 108 109  CO2 27 26 21* 23  GLUCOSE 321* 117* 183* 123*  BUN 44* 40* 36* 20  CREATININE 1.25* 1.14* 1.30* 1.11*  CALCIUM 8.8* 8.2* 8.1* 8.0*  MG  --   --  1.9 1.7  PHOS  --   --  3.7 3.1   Estimated Creatinine Clearance: 28 mL/min (A) (by C-G formula based on SCr of 1.11 mg/dL (H)). Liver & Pancreas: Recent Labs  Lab 01/16/21 1642 01/18/21 0132 01/19/21 0743  AST 17  --   --   ALT 18  --   --   ALKPHOS 55  --   --   BILITOT 0.6  --   --   PROT 6.0*  --   --   ALBUMIN 2.9* 2.4* 2.3*   No results for input(s): LIPASE, AMYLASE in the last 168 hours. No results for input(s): AMMONIA in the last 168 hours. Diabetic: No results for input(s): HGBA1C in the last 72 hours.  Recent Labs  Lab 01/20/21 1130 01/20/21 1552 01/20/21 2153 01/21/21 0801 01/21/21 1120  GLUCAP 166* 243* 223* 166* 291*   Cardiac Enzymes: No results for input(s): CKTOTAL, CKMB, CKMBINDEX, TROPONINI in the last 168 hours. No results for input(s): PROBNP in the last 8760 hours. Coagulation Profile: No results for input(s): INR, PROTIME in the last 168 hours. Thyroid Function Tests: No results for input(s): TSH, T4TOTAL, FREET4, T3FREE, THYROIDAB in the last 72 hours. Lipid Profile: No results for input(s): CHOL, HDL, LDLCALC, TRIG, CHOLHDL, LDLDIRECT in the last 72 hours.  Anemia Panel: No results for input(s): VITAMINB12, FOLATE, FERRITIN, TIBC,  IRON, RETICCTPCT in the last 72 hours.  Urine  analysis:    Component Value Date/Time   COLORURINE YELLOW 07/31/2018 0516   APPEARANCEUR HAZY (A) 07/31/2018 0516   LABSPEC 1.011 07/31/2018 0516   PHURINE 5.0 07/31/2018 0516   GLUCOSEU NEGATIVE 07/31/2018 0516   HGBUR SMALL (A) 07/31/2018 0516   BILIRUBINUR NEGATIVE 07/31/2018 0516   KETONESUR NEGATIVE 07/31/2018 0516   PROTEINUR NEGATIVE 07/31/2018 0516   NITRITE NEGATIVE 07/31/2018 0516   LEUKOCYTESUR NEGATIVE 07/31/2018 0516   Sepsis Labs: Invalid input(s): PROCALCITONIN, [place]  Microbiology: Recent Results (from the past 240 hour(s))  Blood Cultures x 2 sites     Status: None (Preliminary result)   Collection Time: 01/16/21  4:42 PM   Specimen: BLOOD  Result Value Ref Range Status   Specimen Description BLOOD LEFT ANTECUBITAL  Final   Special Requests   Final    BOTTLES DRAWN AEROBIC ONLY Blood Culture adequate volume   Culture   Final    NO GROWTH 4 DAYS Performed at [facility], [address], [place], [place] [address]    Report Status PENDING  Incomplete  Blood Cultures x 2 sites     Status: None (Preliminary result)   Collection Time: 01/16/21  4:42 PM   Specimen: BLOOD LEFT ARM  Result Value Ref Range Status   Specimen Description BLOOD LEFT ARM  Final   Special Requests   Final    BOTTLES DRAWN AEROBIC ONLY Blood Culture adequate volume   Culture   Final    NO GROWTH 4 DAYS Performed at [facility], [place] [address], [place], [place] [address]    Report Status PENDING  Incomplete  Resp Panel by RT-PCR (Flu A&B, Covid) Nasopharyngeal Swab     Status: None   Collection Time: 01/16/21  5:12 PM   Specimen: Nasopharyngeal Swab; Nasopharyngeal(NP) swabs in vial transport medium  Result Value Ref Range Status   SARS Coronavirus 2 by RT PCR NEGATIVE NEGATIVE Final    Comment: (NOTE) SARS-CoV-2 target nucleic acids are NOT DETECTED.  The SARS-CoV-2 RNA is generally detectable in upper  respiratory specimens during the acute phase of infection. The lowest concentration of SARS-CoV-2 viral copies this assay can detect is 138 copies/mL. A negative result does not preclude SARS-Cov-2 infection and should not be used as the sole basis for treatment or other patient management decisions. A negative result may occur with  improper specimen collection/handling, submission of specimen other than nasopharyngeal swab, presence of viral mutation(s) within the areas targeted by this assay, and inadequate number of viral copies(<138 copies/mL). A negative result must be combined with clinical observations, patient history, and epidemiological information. The expected result is Negative.  Fact Sheet for Patients:  EntrepreneurPulse.com.au  Fact Sheet for Healthcare Providers:  IncredibleEmployment.be  This test is no t yet approved or cleared by the [place] FDA and  has been authorized for detection and/or diagnosis of SARS-CoV-2 by FDA under an Emergency Use Authorization (EUA). This EUA will remain  in effect (meaning this test can be used) for the duration of the COVID-19 declaration under Section 564(b)(1) of the Act, 21 U.S.C.section 360bbb-3(b)(1), unless the authorization is terminated  or revoked sooner.       Influenza A by PCR NEGATIVE NEGATIVE Final   Influenza B by PCR NEGATIVE NEGATIVE Final    Comment: (NOTE) The Xpert Xpress SARS-CoV-2/FLU/RSV plus assay is intended as an aid in the diagnosis of influenza from Nasopharyngeal swab specimens and should not be used as a sole basis for treatment. Nasal  washings and aspirates are unacceptable for Xpert Xpress SARS-CoV-2/FLU/RSV testing.  Fact Sheet for Patients: EntrepreneurPulse.com.au  Fact Sheet for Healthcare Providers: IncredibleEmployment.be  This test is not yet approved or cleared by the [place] FDA and has been  authorized for detection and/or diagnosis of SARS-CoV-2 by FDA under an Emergency Use Authorization (EUA). This EUA will remain in effect (meaning this test can be used) for the duration of the COVID-19 declaration under Section 564(b)(1) of the Act, 21 U.S.C. section 360bbb-3(b)(1), unless the authorization is terminated or revoked.  Performed at [facility], [place] [address], [place], [place] [address]     Radiology Studies: No results found.    Nahiem Dredge T. [place]  If 7PM-7AM, please contact night-coverage [email or web address] 01/21/2021, 3:44 PM

## 2021-01-22 LAB — GLUCOSE, CAPILLARY
Glucose-Capillary: 208 mg/dL — ABNORMAL HIGH (ref 70–99)
Glucose-Capillary: 196 mg/dL — ABNORMAL HIGH (ref 70–99)
Glucose-Capillary: 148 mg/dL — ABNORMAL HIGH (ref 70–99)
Glucose-Capillary: 108 mg/dL — ABNORMAL HIGH (ref 70–99)
Glucose-Capillary: 156 mg/dL — ABNORMAL HIGH (ref 70–99)
Glucose-Capillary: 148 mg/dL — ABNORMAL HIGH (ref 70–99)

## 2021-01-22 NOTE — PMR Pre-admission (Signed)
PMR Admission Coordinator Pre-Admission Assessment  Patient: Shelby Walker is an 87 y.o., female MRN: [id number] DOB: 02-19-1934 Height: 5\' 1"  (154.9 cm) Weight: 54.5 kg  Insurance Information HMO:     PPO:      PCP:      IPA:      80/20: yes     OTHER:  PRIMARY: Medicare A & B      Policy#: [id number]      Subscriber: patient CM Name:       Phone#:      Fax#:  Pre-Cert#:       Employer:  Benefits:  Phone #: Verified eligibility via [employer] on 01/22/21     Name:  Eff. Date: Part A & B effective 06/04/99     Deduct: $1,556      Out of Pocket Max: NA      Life Max: NA CIR: 100% coverage      SNF: 100% days 1-20, 80% days 21-100 Outpatient: 80%     Co-Pay: 20% Home Health: 100%      Co-Pay:  DME: 80%     Co-Pay: 20% Providers: pt's [facility]       Policy#: [id number]     Phone#: [phone]  Financial Counselor:       Phone#:   The "Data Collection Information Summary" for patients in Inpatient Rehabilitation Facilities with attached "Privacy Act [place] Records" was provided and verbally reviewed with: Patient  Emergency Contact Information Contact Information     Name Relation Home Work Mobile   [place] Daughter   [phone]   [place], Hildenbrand   [phone]       Current Medical History  Patient Admitting Diagnosis: diabetic foot infection s/p right transmetatarsal amputation History of Present Illness: Pt is an 87 year old female with medical hx significant for: C.diff, DM II, RA, CKD, A-fib, GERD, osteoporosis. Pt presented to the hospital with a diabetic foot ulcer. Pt had had issues with her right foot for months. Previously she had an ulcer on the bottom of her foot; took antibiotics and it healed. An aortogram showed adequate circulation for wound healing. Pt last seen on 11/7 by Dr. Amalia Hailey for right foot edema/erythema and was thought to have gout; given meloxicam. Pt noted a dorsal foot wound that spontaneously appeared on 11/12  that was apparently necrotic. Pt went to ER, given Doxy and referred to Dr. Doran Durand. Pt saw Dr. Doran Durand on 11/14 and was directly admitted to hospital for probable right TMA. PT had right TMA on 01/18/21. Therapy evaluations completed and CIR recommended d/t pt's deficits in functional mobility and inability to perform ADLs independently.     Patient's medical record from [facility] has been reviewed by the rehabilitation admission coordinator and physician.  Past Medical History  Past Medical History:  Diagnosis Date   Bruit    Carotid Doppler showed no significant abnormality     9per patient)   C. difficile colitis 07/2018   with severe sepsis   Chest pain, unspecified    Nuclear, May, 2008, no scar or ischemia   Diabetes mellitus    Diverticulosis    Dyslipidemia    Ejection fraction    EF 55-60%, echo, February, 2011 // Echocardiogram 8/21: EF 60-65, no RWMA, Gr 1 DD, GLS -14%, normal RVSF, mild LAE, trivial MR, mild MS (mean 4 mmHg), RVSP 23.4    GERD (gastroesophageal reflux disease)    Mitral regurgitation April 21, 2009  mild,  echo, February, 2011   Osteoporosis    Palpitations    possible very brief atrial fibrillation on monitor and possible reentrant tachycardia   Psoriasis    Rheumatoid arthritis ([place])     Has the patient had major surgery during 100 days prior to admission? Yes  Family History   family history includes Diabetes in her father and mother; Heart attack in her father and mother; Heart failure in her father and mother; Stroke in her sister.  Current Medications  Current Facility-Administered Medications:    acetaminophen (TYLENOL) tablet 650 mg, 650 mg, Oral, Q6H PRN, 650 mg at 01/19/21 2229 **OR** acetaminophen (TYLENOL) suppository 650 mg, 650 mg, Rectal, Q6H PRN, Wylene Simmer, MD   apixaban Arne Cleveland) tablet 2.5 mg, 2.5 mg, Oral, BID, Cyndia Skeeters, Taye T, MD, 2.5 mg at 01/23/21 0825   atorvastatin (LIPITOR) tablet 20 mg, 20 mg, Oral, QHS,  Wylene Simmer, MD, 20 mg at 01/22/21 2135   bisacodyl (DULCOLAX) EC tablet 5 mg, 5 mg, Oral, Daily PRN, Wylene Simmer, MD   carvedilol (COREG) tablet 3.125 mg, 3.125 mg, Oral, BID WC, Cyndia Skeeters, Taye T, MD, 3.125 mg at 01/23/21 1724   cycloSPORINE (RESTASIS) 0.05 % ophthalmic emulsion 1 drop, 1 drop, Both Eyes, BID, Wylene Simmer, MD, 1 drop at 01/23/21 0825   diclofenac Sodium (VOLTAREN) 1 % topical gel 2 g, 2 g, Topical, QID, Cyndia Skeeters, Taye T, MD, 2 g at 01/23/21 1726   feeding supplement (ENSURE ENLIVE / ENSURE PLUS) liquid 237 mL, 237 mL, Oral, BID BM, Wylene Simmer, MD, 237 mL at 01/23/21 1410   gabapentin (NEURONTIN) capsule 100 mg, 100 mg, Oral, TID PRN, Wylene Simmer, MD, 100 mg at 01/23/21 0325   hydrALAZINE (APRESOLINE) injection 5 mg, 5 mg, Intravenous, Q4H PRN, Wylene Simmer, MD   insulin aspart (novoLOG) injection 0-5 Units, 0-5 Units, Subcutaneous, QHS, Gonfa, Taye T, MD   insulin aspart (novoLOG) injection 0-9 Units, 0-9 Units, Subcutaneous, TID WC, Gonfa, Taye T, MD, 1 Units at 01/23/21 1724   insulin aspart (novoLOG) injection 3 Units, 3 Units, Subcutaneous, TID WC, Gonfa, Taye T, MD, 3 Units at 01/23/21 1725   insulin glargine-yfgn (SEMGLEE) injection 8 Units, 8 Units, Subcutaneous, Daily, Gonfa, Taye T, MD, 8 Units at 01/23/21 0827   lidocaine (LIDODERM) 5 % 1 patch, 1 patch, Transdermal, Q24H, Gonfa, Taye T, MD, 1 patch at 01/23/21 0830   lip balm (CARMEX) ointment, , Topical, PRN, Wendee Beavers T, MD, 75 application at 69/62/95 0654   metoCLOPramide (REGLAN) injection 5 mg, 5 mg, Intravenous, Q6H PRN, Cyndia Skeeters, Taye T, MD, 5 mg at 01/20/21 2126   morphine 2 MG/ML injection 2 mg, 2 mg, Intravenous, Q4H PRN, Corky Sing, PA-C, 2 mg at 01/22/21 2841   multivitamin (RENA-VIT) tablet 1 tablet, 1 tablet, Oral, QHS, Wylene Simmer, MD, 1 tablet at 01/22/21 2135   oxyCODONE (Oxy IR/ROXICODONE) immediate release tablet 5 mg, 5 mg, Oral, Q4H PRN, Wylene Simmer, MD, 5 mg at 01/20/21 0245   pantoprazole  (PROTONIX) EC tablet 40 mg, 40 mg, Oral, Daily, Wylene Simmer, MD, 40 mg at 01/23/21 3244   polyethylene glycol (MIRALAX / GLYCOLAX) packet 17 g, 17 g, Oral, Daily PRN, Wylene Simmer, MD   predniSONE (DELTASONE) tablet 4 mg, 4 mg, Oral, q AM, Wylene Simmer, MD, 4 mg at 01/23/21 0102   saccharomyces boulardii (FLORASTOR) capsule 250 mg, 250 mg, Oral, BID, Wylene Simmer, MD, 250 mg at 01/23/21 0825   traMADol (ULTRAM) tablet 50 mg, 50 mg,  Oral, Q6H PRN, Wylene Simmer, MD, 50 mg at 01/23/21 1401  Patients Current Diet:  Diet Order             Diet regular Room service appropriate? Yes; Fluid consistency: Thin  Diet effective now                   Precautions / Restrictions Precautions Precautions: Fall Other Brace: CAM boot when OOB and when WB RLE Restrictions Weight Bearing Restrictions: Yes RLE Weight Bearing: Non weight bearing Other Position/Activity Restrictions: CAM boot when OOB and when WB RLE   Has the patient had 2 or more falls or a fall with injury in the past year? No  Prior Activity Level Community (5-7x/wk): drives, gets out of house almost daily  Prior Functional Level Self Care: Did the patient need help bathing, dressing, using the toilet or eating? Independent  Indoor Mobility: Did the patient need assistance with walking from room to room (with or without device)? Independent  Stairs: Did the patient need assistance with internal or external stairs (with or without device)? Independent  Functional Cognition: Did the patient need help planning regular tasks such as shopping or remembering to take medications? Independent  Patient Information Are you of Hispanic, Latino/a,or Spanish origin?: A. No, not of Hispanic, Latino/a, or Spanish origin What is your race?: A. White Do you need or want an interpreter to communicate with a doctor or health care staff?: 0. No  Patient's Response To:  Health Literacy and Transportation Is the patient able to respond to  health literacy and transportation needs?: Yes Health Literacy - How often do you need to have someone help you when you read instructions, pamphlets, or other written material from your doctor or pharmacy?: Never In the past 12 months, has lack of transportation kept you from medical appointments or from getting medications?: No In the past 12 months, has lack of transportation kept you from meetings, work, or from getting things needed for daily living?: No  Development worker, international aid / [place] Devices/Equipment: CBG Meter, Eyeglasses, Environmental consultant (specify type), Cane (specify quad or straight), Dentures (specify type) (upper and lower dentures in) Home Equipment: Rolling Walker (2 wheels), Shower seat  Prior Device Use: Indicate devices/aids used by the patient prior to current illness, exacerbation or injury? Walker and cane  Current Functional Level Cognition  Overall Cognitive Status: Within Functional Limits for tasks assessed Orientation Level: Oriented X4 Safety/Judgement: Decreased awareness of safety, Decreased awareness of deficits General Comments: requires safety cues throughout mobility    Extremity Assessment (includes Sensation/Coordination)  Upper Extremity Assessment: Overall WFL for tasks assessed LUE Deficits / Details: Noted 10/10 pain when attempting shoulder flexion. Reports she had incident at home 3 weeks when she twisted her arm as she lost balance and has had this pain ever since. Pain decreased to 5/10 with passive flexion. Stated she had full functional use of RUE prior and is now with very limited ability to use functionally. Messaged ortho PA.  Lower Extremity Assessment: Defer to PT evaluation RLE Deficits / Details: limited by pain; able to perform SLR RLE: Unable to fully assess due to pain    ADLs  Overall ADL's : Needs assistance/impaired Eating/Feeding: Set up, Sitting Grooming: Brushing hair, Sitting Grooming Details (indicate cue type and  reason): performed seated grooming task at sink Upper Body Bathing: Minimal assistance, Sitting Lower Body Bathing: Minimal assistance, Sitting/lateral leans Upper Body Dressing : Supervision/safety, Sitting Upper Body Dressing Details (indicate cue type and  reason): donned gown seated in chair, able to stand to pull gown down while holding on to RW with 1 hand Lower Body Dressing: Maximal assistance, Sitting/lateral leans Lower Body Dressing Details (indicate cue type and reason): max A to don CAM boot sitting in chair Toilet Transfer: Minimal assistance, Ambulation, Rolling walker (2 wheels) Toileting- Clothing Manipulation and Hygiene: Moderate assistance, Sitting/lateral lean Functional mobility during ADLs: Minimal assistance, Moderate assistance General ADL Comments: Pt reported that she had just got back in bed after sponge bathing while sitting in chair. Pt reported 10/10 pain during activity and declined any OOB/EOB activity with OT as her pain had "just settled down." States that she was able to participate in bathing, but needed assistance due to pain and limited use of LUE.    Mobility  Overal bed mobility: Needs Assistance Bed Mobility: Sit to Supine Supine to sit: Supervision, HOB elevated Sit to supine: HOB elevated, Supervision General bed mobility comments: assist for RLE lift into bed, boost up in bed with bed pads.    Transfers  Overall transfer level: Needs assistance Equipment used: Rolling walker (2 wheels) Transfers: Sit to/from Stand, Bed to chair/wheelchair/BSC Sit to Stand: Min assist, From elevated surface, +2 physical assistance, Mod assist Bed to/from chair/wheelchair/BSC transfer type:: Step pivot Stand pivot transfers: Mod assist Step pivot transfers: Mod assist, Max assist General transfer comment: pt required mod x2 for initial sit to stand transfer, pt gradually degreased to min A for transfers and ambulation during session    Ambulation / Gait / Stairs  / Wheelchair Mobility       Posture / Balance Balance Overall balance assessment: Needs assistance Sitting-balance support: No upper extremity supported Sitting balance-Leahy Scale: Good Standing balance support: Bilateral upper extremity supported, During functional activity, Reliant on assistive device for balance Standing balance-Leahy Scale: Poor Standing balance comment: reliant on external support    Special needs/care consideration Skin Surgical incision: foot/right; Cellulitis: foot/right and Diabetic management novoLOG 0-9 units: 3x daily with meals; novoLOG 0-5 units: daily at bedtime; novoLOG 3 units: 3x daily with meals ; Semglee 8 units: daily   Previous Home Environment (from acute therapy documentation) Living Arrangements: Alone Available Help at Discharge: Family, Available 24 hours/day Type of Home: House Home Layout: Able to live on main level with bedroom/bathroom, Laundry or work area in basement Home Access: Stairs to enter Entrance Stairs-Rails: Horticulturist, commercial of Steps: 1 Bathroom Shower/Tub: Multimedia programmer: Handicapped height Bathroom Accessibility: Yes (pt thinks a walker may be able to fit in bathroom. She said she has never tried to take one into bathroom.) How Accessible: Accessible via walker Home Care Services: No Additional Comments: used cane intemittently, has used RW more recently due to foot pain  Discharge Living Setting Plans for Discharge Living Setting: Patient's home Type of Home at Discharge: House Discharge Home Layout: Able to live on main level with bedroom/bathroom, Laundry or work area in basement Discharge Home Access: Stairs to enter Entrance Stairs-Rails: Left Entrance Stairs-Number of Steps: 1 Discharge Bathroom Shower/Tub: Walk-in shower Discharge Bathroom Toilet: Handicapped height Discharge Bathroom Accessibility: Yes (pt thinks a walker may be able to fit inside her bathroom. She has never tried to  take one into the bathroom.) How Accessible: Accessible via walker Does the patient have any problems obtaining your medications?: No  Social/Family/Support Systems Anticipated Caregiver: [name], daughter and Eliany Mccarter, son Anticipated Caregiver's Contact Information: [name]: [phone]; Dean:[phone] Caregiver Availability: 24/7 Discharge Plan Discussed with Primary Caregiver: Yes Is Caregiver  In Agreement with Plan?: Yes Does Caregiver/Family have Issues with Lodging/Transportation while Pt is in Rehab?: No  Goals Patient/Family Goal for Rehab: Supervision: PT/OT Expected length of stay: 7-10  days Pt/Family Agrees to Admission and willing to participate: Yes Program Orientation Provided & Reviewed with Pt/Caregiver Including Roles  & Responsibilities: Yes  Decrease burden of Care through IP rehab admission: NA  Possible need for SNF placement upon discharge: Not anticipated  Patient Condition: I have reviewed medical records from [facility], spoken with CM, and patient and family member. I met with patient at the bedside for inpatient rehabilitation assessment.  Patient will benefit from ongoing PT and OT, can actively participate in 3 hours of therapy a day 5 days of the week, and can make measurable gains during the admission.  Patient will also benefit from the coordinated team approach during an Inpatient Acute Rehabilitation admission.  The patient will receive intensive therapy as well as Rehabilitation physician, nursing, social worker, and care management interventions.  Due to safety, skin/wound care, disease management, medication administration, pain management, and patient education the patient requires 24 hour a day rehabilitation nursing.  The patient is currently min+2 with mobility and basic ADLs.  Discharge setting and therapy post discharge at home with home health is anticipated.  Patient has agreed to participate in the Acute Inpatient Rehabilitation Program and will  admit today.  Preadmission Screen Completed By:  Bethel Born, 01/23/2021 7:04 PM ______________________________________________________________________   Discussed status with Dr. Dagoberto Ligas  on 01/24/21 at 945 and received approval for admission today.  Admission Coordinator:  Bethel Born, CCC-SLP, time 1030/Date 01/24/21  with updates by Clemens Catholic, MS, CCC-SLP   Assessment/Plan: Diagnosis: Does the need for close, 24 hr/day Medical supervision in concert with the patient's rehab needs make it unreasonable for this patient to be served in a less intensive setting? Yes Co-Morbidities requiring supervision/potential complications: DM, R TMA due to diabetic foot infection and necrosis; HOH, HTN, RA, CKD3B; PAD, Afib on Eliquis.  Due to bladder management, bowel management, safety, skin/wound care, disease management, medication administration, pain management, and patient education, does the patient require 24 hr/day rehab nursing? Yes Does the patient require coordinated care of a physician, rehab nurse, PT, OT, and SLP to address physical and functional deficits in the context of the above medical diagnosis(es)? Yes Addressing deficits in the following areas: balance, endurance, locomotion, strength, transferring, bowel/bladder control, bathing, dressing, feeding, grooming, and toileting Can the patient actively participate in an intensive therapy program of at least 3 hrs of therapy 5 days a week? Yes The potential for patient to make measurable gains while on inpatient rehab is good Anticipated functional outcomes upon discharge from inpatient rehab: modified independent and supervision PT, modified independent and supervision OT, n/a SLP Estimated rehab length of stay to reach the above functional goals is: 7-10 days  Anticipated discharge destination: Home 10. Overall Rehab/Functional Prognosis: good   MD Signature:

## 2021-01-22 NOTE — Progress Notes (Signed)
PROGRESS NOTE    Shelby Walker  CMK:[id number] DOB: 12-09-1933 DOA: 01/16/2021 PCP: Reynold Bowen, MD     Brief Narrative:  Shelby Walker is an 87 year old F with PMH of DM-2, RA, CKD-3B, PAD, A. fib on Eliquis, HTN and HOH presenting with right foot wound and erythema with drainage, and admitted for diabetic right foot infection.   Previously, she had an ulcer on the bottom of right foot and took antibiotics and it healed; an aortogram on 10/19/20 showed adequate circulation for wound healing at that time.  She was last seen on 11/7 by Dr. Amalia Hailey for R foot edema/erythema and was thought to have gout; she was given an rx for meloxicam.  This time, she noticed a dorsal foot wound that spontaneously appeared on 11/13, and was apparently necrotic at that time.  She went to the ER and was given Doxy and referred to Dr. Doran Durand who saw her 11/14 and sent her for direct admission.     Patient was started on ceftriaxone, vancomycin and Flagyl. Underwent right TMT on 01/18/2021 by Dr. Doran Durand.  Antibiotics discontinued per recommendation by orthopedic surgery.  Therapy recommended CIR. CIR following.   New events last 24 hours / Subjective: Patient without any new complaints this morning.  Has some pain intermittently which is tolerable.  Assessment & Plan:   Principal Problem:   Diabetic foot infection ([place]) Active Problems:   PAD (peripheral artery disease) ([place])   Mixed hyperlipidemia   Paroxysmal atrial fibrillation ([facility])   Essential hypertension   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   Uncontrolled type 2 diabetes mellitus with hyperglycemia ([facility])   Rheumatoid arthritis ([place])   Stage 3b chronic kidney disease (CKD) ([place])   DNR (do not resuscitate)   Protein-calorie malnutrition, severe   Diabetic right foot infection -S/p right TMT amputation on 11/16 -Antibiotics discontinued given source control with surgery -WBAT on RLE in cam boot per Ortho -Therapy recommended CIR.   CR following.   Left shoulder pain/rotator cuff injury -Suspect supraspinatus tendon injury aggravated perioperatively.  Improving. -Therapy, Voltaren gel, lidocaine and pain meds   History of recurrent C diff infection -Completed antibiotics   CKD 3b -Stable    Anemia of chronic disease -Stable     IDDM-2 with hyperglycemia, hyperlipidemia, neuropathy and diabetic foot ulcer -Semglee, NovoLog, sliding scale insulin  Hyperlipidemia -Continue Lipitor   Paroxysmal A. fib -Continue home Coreg and Eliquis.   Hypotension -Continue Coreg   RA -Due for next infusion on Monday, 11/21 -Continue home prednisone 4 mg daily -Watch for adrenal insufficiency from chronic steroid use     DVT prophylaxis: Eliquis Code Status: DNR Family Communication: No family at bedside Disposition Plan:  Status is: Inpatient  Remains inpatient appropriate because: Awaiting CIR placement    Antimicrobials:  Anti-infectives (From admission, onward)    Start     Dose/Rate Route Frequency Ordered Stop   01/18/21 2020  vancomycin (VANCOCIN) powder  Status:  Discontinued          As needed 01/18/21 2021 01/18/21 2021   01/18/21 1723  vancomycin (VANCOCIN) 1-5 GM/200ML-% IVPB       Note to Pharmacy: Gregery Na   : cabinet override      01/18/21 1723 01/18/21 2032   01/18/21 1700  vancomycin (VANCOREADY) IVPB 1000 mg/200 mL        1,000 mg 200 mL/hr over 60 Minutes Intravenous Every 48 hours 01/16/21 1804 01/19/21 2000   01/18/21 0600  ceFAZolin (ANCEF) IVPB  2g/100 mL premix  Status:  Discontinued        2 g 200 mL/hr over 30 Minutes Intravenous On call to O.R. 01/17/21 2005 01/18/21 2124   01/17/21 1100  ceFEPIme (MAXIPIME) 1 g in sodium chloride 0.9 % 100 mL IVPB        1 g 200 mL/hr over 30 Minutes Intravenous Every 12 hours 01/17/21 1011 01/19/21 0933   01/16/21 1700  cefTRIAXone (ROCEPHIN) 2 g in sodium chloride 0.9 % 100 mL IVPB  Status:  Discontinued        2 g 200 mL/hr over 30  Minutes Intravenous Every 24 hours 01/16/21 1607 01/17/21 0953   01/16/21 1700  metroNIDAZOLE (FLAGYL) IVPB 500 mg        500 mg 100 mL/hr over 60 Minutes Intravenous Every 8 hours 01/16/21 1607 01/19/21 1558   01/16/21 1700  vancomycin (VANCOREADY) IVPB 1250 mg/250 mL        1,250 mg 166.7 mL/hr over 90 Minutes Intravenous  Once 01/16/21 1614 01/16/21 2324        Objective: Vitals:   01/21/21 1445 01/21/21 2130 01/22/21 0629 01/22/21 0812  BP: (!) 112/49 (!) 120/49 140/67 (!) 139/50  Pulse: 76 79 78 74  Resp: 14 16 18 17   Temp: 98.7 F (37.1 C) 98.3 F (36.8 C) 98.2 F (36.8 C) 98.9 F (37.2 C)  TempSrc: Oral Oral  Oral  SpO2: 96% 96% 96% 95%  Weight:      Height:        Intake/Output Summary (Last 24 hours) at 01/22/2021 1311 Last data filed at 01/22/2021 0855 Gross per 24 hour  Intake 240 ml  Output --  Net 240 ml   Filed Weights   01/16/21 1603  Weight: 54.5 kg    Examination:  General exam: Appears calm and comfortable  Respiratory system: Clear to auscultation. Respiratory effort normal. No respiratory distress. No conversational dyspnea.  Cardiovascular system: S1 & S2 heard, RRR. No murmurs. No pedal edema. Gastrointestinal system: Abdomen is nondistended, soft and nontender. Normal bowel sounds heard. Central nervous system: Alert and oriented. No focal neurological deficits. Speech clear.  Extremities: Right foot in dressing Psychiatry: Judgement and insight appear normal. Mood & affect appropriate.   Data Reviewed: I have personally reviewed following labs and imaging studies  CBC: Recent Labs  Lab 01/16/21 1642 01/17/21 0520 01/18/21 0132 01/19/21 0743  WBC 10.1 7.8 7.0 9.3  NEUTROABS 8.1*  --   --   --   HGB 10.6* 9.9* 9.3* 9.6*  HCT 32.4* 30.1* 29.1* 30.3*  MCV 93.1 92.6 93.6 93.2  PLT 269 244 244 409   Basic Metabolic Panel: Recent Labs  Lab 01/16/21 1642 01/17/21 0520 01/18/21 0132 01/19/21 0743  NA 139 142 139 140  K 5.2* 4.4  4.2 4.2  CL 103 107 108 109  CO2 27 26 21* 23  GLUCOSE 321* 117* 183* 123*  BUN 44* 40* 36* 20  CREATININE 1.25* 1.14* 1.30* 1.11*  CALCIUM 8.8* 8.2* 8.1* 8.0*  MG  --   --  1.9 1.7  PHOS  --   --  3.7 3.1   GFR: Estimated Creatinine Clearance: 28 mL/min (A) (by C-G formula based on SCr of 1.11 mg/dL (H)). Liver Function Tests: Recent Labs  Lab 01/16/21 1642 01/18/21 0132 01/19/21 0743  AST 17  --   --   ALT 18  --   --   ALKPHOS 55  --   --   BILITOT 0.6  --   --  PROT 6.0*  --   --   ALBUMIN 2.9* 2.4* 2.3*   No results for input(s): LIPASE, AMYLASE in the last 168 hours. No results for input(s): AMMONIA in the last 168 hours. Coagulation Profile: No results for input(s): INR, PROTIME in the last 168 hours. Cardiac Enzymes: No results for input(s): CKTOTAL, CKMB, CKMBINDEX, TROPONINI in the last 168 hours. BNP (last 3 results) No results for input(s): PROBNP in the last 8760 hours. HbA1C: No results for input(s): HGBA1C in the last 72 hours. CBG: Recent Labs  Lab 01/21/21 1602 01/21/21 2131 01/22/21 0352 01/22/21 0813 01/22/21 1118  GLUCAP 149* 174* 208* 196* 148*   Lipid Profile: No results for input(s): CHOL, HDL, LDLCALC, TRIG, CHOLHDL, LDLDIRECT in the last 72 hours. Thyroid Function Tests: No results for input(s): TSH, T4TOTAL, FREET4, T3FREE, THYROIDAB in the last 72 hours. Anemia Panel: No results for input(s): VITAMINB12, FOLATE, FERRITIN, TIBC, IRON, RETICCTPCT in the last 72 hours. Sepsis Labs: Recent Labs  Lab 01/16/21 1642  LATICACIDVEN 1.3    Recent Results (from the past 240 hour(s))  Blood Cultures x 2 sites     Status: None   Collection Time: 01/16/21  4:42 PM   Specimen: BLOOD  Result Value Ref Range Status   Specimen Description BLOOD LEFT ANTECUBITAL  Final   Special Requests   Final    BOTTLES DRAWN AEROBIC ONLY Blood Culture adequate volume   Culture   Final    NO GROWTH 5 DAYS Performed at [facility], [address] [place], [place] [address]    Report Status 01/21/2021 FINAL  Final  Blood Cultures x 2 sites     Status: None   Collection Time: 01/16/21  4:42 PM   Specimen: BLOOD LEFT ARM  Result Value Ref Range Status   Specimen Description BLOOD LEFT ARM  Final   Special Requests   Final    BOTTLES DRAWN AEROBIC ONLY Blood Culture adequate volume   Culture   Final    NO GROWTH 5 DAYS Performed at [facility], [place] [address], [place], [place] [address]    Report Status 01/21/2021 FINAL  Final  Resp Panel by RT-PCR (Flu A&B, Covid) Nasopharyngeal Swab     Status: None   Collection Time: 01/16/21  5:12 PM   Specimen: Nasopharyngeal Swab; Nasopharyngeal(NP) swabs in vial transport medium  Result Value Ref Range Status   SARS Coronavirus 2 by RT PCR NEGATIVE NEGATIVE Final    Comment: (NOTE) SARS-CoV-2 target nucleic acids are NOT DETECTED.  The SARS-CoV-2 RNA is generally detectable in upper respiratory specimens during the acute phase of infection. The lowest concentration of SARS-CoV-2 viral copies this assay can detect is 138 copies/mL. A negative result does not preclude SARS-Cov-2 infection and should not be used as the sole basis for treatment or other patient management decisions. A negative result may occur with  improper specimen collection/handling, submission of specimen other than nasopharyngeal swab, presence of viral mutation(s) within the areas targeted by this assay, and inadequate number of viral copies(<138 copies/mL). A negative result must be combined with clinical observations, patient history, and epidemiological information. The expected result is Negative.  Fact Sheet for Patients:  EntrepreneurPulse.com.au  Fact Sheet for Healthcare Providers:  IncredibleEmployment.be  This test is no t yet approved or cleared by the [place] FDA and  has been authorized for detection and/or diagnosis of SARS-CoV-2 by FDA under an  Emergency Use Authorization (EUA). This EUA will remain  in effect (meaning this test can be used) for the duration of the COVID-19 declaration under Section 564(b)(1) of the Act, 21 U.S.C.section 360bbb-3(b)(1), unless the authorization is terminated  or revoked sooner.       Influenza A by PCR NEGATIVE NEGATIVE Final   Influenza B by PCR NEGATIVE NEGATIVE Final    Comment: (NOTE) The Xpert Xpress SARS-CoV-2/FLU/RSV plus assay is intended as an aid in the diagnosis of influenza from Nasopharyngeal swab specimens and should not be used as a sole basis for treatment. Nasal washings and aspirates are unacceptable for Xpert Xpress SARS-CoV-2/FLU/RSV testing.  Fact Sheet for Patients: EntrepreneurPulse.com.au  Fact Sheet for Healthcare Providers: IncredibleEmployment.be  This test is not yet approved or cleared by the [place] FDA and has been authorized for detection and/or diagnosis of SARS-CoV-2 by FDA under an Emergency Use Authorization (EUA). This EUA will remain in effect (meaning this test can be used) for the duration of the COVID-19 declaration under Section 564(b)(1) of the Act, 21 U.S.C. section 360bbb-3(b)(1), unless the authorization is terminated or revoked.  Performed at [facility], [place] [address] [place], [place] [address]       Radiology Studies: No results found.    Scheduled Meds:  apixaban  2.5 mg Oral BID   atorvastatin  20 mg Oral QHS   carvedilol  3.125 mg Oral BID WC   cycloSPORINE  1 drop Both Eyes BID   diclofenac Sodium  2 g Topical QID   feeding supplement  237 mL Oral BID BM   feeding supplement  296 mL Oral Once   insulin aspart  0-5 Units Subcutaneous QHS   insulin aspart  0-9 Units Subcutaneous TID WC   insulin aspart  3 Units Subcutaneous TID WC   insulin glargine-yfgn  8 Units Subcutaneous Daily   lidocaine  1 patch Transdermal Q24H   multivitamin  1 tablet Oral QHS   pantoprazole   40 mg Oral Daily   predniSONE  4 mg Oral q AM   saccharomyces boulardii  250 mg Oral BID   Continuous Infusions:   LOS: 6 days      Time spent: 25 minutes   Dessa Phi, DO [facility] 01/22/2021, 1:11 PM   Available via Epic secure chat 7am-7pm After these hours, please refer to coverage provider listed on [email or web address]

## 2021-01-23 ENCOUNTER — Encounter (HOSPITAL_COMMUNITY): Payer: Medicare Other

## 2021-01-23 ENCOUNTER — Encounter (HOSPITAL_COMMUNITY): Payer: Self-pay | Admitting: Orthopedic Surgery

## 2021-01-23 LAB — GLUCOSE, CAPILLARY
Glucose-Capillary: 165 mg/dL — ABNORMAL HIGH (ref 70–99)
Glucose-Capillary: 288 mg/dL — ABNORMAL HIGH (ref 70–99)
Glucose-Capillary: 137 mg/dL — ABNORMAL HIGH (ref 70–99)
Glucose-Capillary: 85 mg/dL (ref 70–99)

## 2021-01-23 NOTE — Progress Notes (Signed)
Inpatient Rehab Admissions Coordinator:  Saw pt and daughter, [name] at bedside. Informed them that a bed is not available in CIR today. They still expressed interest in CIR. Will continue to follow.  Gayland Curry, [place], [place] Admissions Coordinator [phone]

## 2021-01-23 NOTE — Progress Notes (Signed)
Occupational Therapy Treatment Patient Details Name: Shelby Walker MRN: [id number] DOB: Dec 21, 1933 Today's Date: 01/23/2021   History of present illness 87 yo female s/p R transmet amputation due to gangrene. PMH includes cdiff, DM, GERD, osteoporosis, RA.   OT comments  Pt up in chair upon arrival, initial sit to stand transfer required mod A x2, however pt improving with balance and coordination with CAM boot donned, decreased to min A for transfers by end of session. Pt supervision - max A for ADLs, required max assistance to don CAM boot. Pt supervision for bed mobility, able to bring BLE's onto bed without assistance. Pt presenting with decreased activity tolerance, balance, strength, and ROM at this time, will continue to follow acutely. Pt remains motivated to return to PLOF, continue to recommend CIR at d/c.   Recommendations for follow up therapy are one component of a multi-disciplinary discharge planning process, led by the attending physician.  Recommendations may be updated based on patient status, additional functional criteria and insurance authorization.    Follow Up Recommendations  Acute inpatient rehab (3hours/day)    Assistance Recommended at Discharge Frequent or constant Supervision/Assistance  Equipment Recommendations  None recommended by OT    Recommendations for Other Services PT consult    Precautions / Restrictions Precautions Precautions: Fall Required Braces or Orthoses: Other Brace Other Brace: CAM boot when OOB and when WB RLE Restrictions Weight Bearing Restrictions: Yes RLE Weight Bearing: Non weight bearing Other Position/Activity Restrictions: CAM boot when OOB and when WB RLE       Mobility Bed Mobility Overal bed mobility: Needs Assistance Bed Mobility: Sit to Supine       Sit to supine: HOB elevated;Supervision        Transfers Overall transfer level: Needs assistance Equipment used: Rolling walker (2 wheels) Transfers: Sit to/from  Stand;Bed to chair/wheelchair/BSC Sit to Stand: Min assist;From elevated surface;+2 physical assistance;Mod assist           General transfer comment: pt required mod x2 for initial sit to stand transfer, pt gradually degreased to min A for transfers and ambulation during session     Balance Overall balance assessment: Needs assistance Sitting-balance support: No upper extremity supported Sitting balance-Leahy Scale: Good     Standing balance support: Bilateral upper extremity supported;During functional activity;Reliant on assistive device for balance Standing balance-Leahy Scale: Poor                             ADL either performed or assessed with clinical judgement   ADL Overall ADL's : Needs assistance/impaired     Grooming: Brushing hair;Sitting Grooming Details (indicate cue type and reason): performed seated grooming task at sink         Upper Body Dressing : Supervision/safety;Sitting Upper Body Dressing Details (indicate cue type and reason): donned gown seated in chair, able to stand to pull gown down while holding on to RW with 1 hand Lower Body Dressing: Maximal assistance;Sitting/lateral leans Lower Body Dressing Details (indicate cue type and reason): max A to don CAM boot sitting in chair Toilet Transfer: Minimal assistance;Ambulation;Rolling walker (2 wheels)           Functional mobility during ADLs: Minimal assistance;Moderate assistance      Extremity/Trunk Assessment Upper Extremity Assessment Upper Extremity Assessment: Overall WFL for tasks assessed   Lower Extremity Assessment Lower Extremity Assessment: Defer to PT evaluation        Vision   Vision Assessment?: No  apparent visual deficits   Perception     Praxis      Cognition Arousal/Alertness: Awake/alert Behavior During Therapy: WFL for tasks assessed/performed Overall Cognitive Status: Within Functional Limits for tasks assessed                                             Exercises     Shoulder Instructions       General Comments pt's daughter at bedside, continue to express interest in CIR    Pertinent Vitals/ Pain       Pain Assessment: Faces Pain Score: 5  Faces Pain Scale: Hurts little more Pain Location: R foot Pain Descriptors / Indicators: Constant;Discomfort;Sore Pain Intervention(s): Limited activity within patient's tolerance;Monitored during session;RN gave pain meds during session;Repositioned  Home Living                                          Prior Functioning/Environment              Frequency  Min 2X/week        Progress Toward Goals  OT Goals(current goals can now be found in the care plan section)  Progress towards OT goals: Progressing toward goals  Acute Rehab OT Goals Patient Stated Goal: to get better OT Goal Formulation: With patient/family Time For Goal Achievement: 02/03/21 Potential to Achieve Goals: Good ADL Goals Pt Will Perform Upper Body Dressing: sitting;with supervision Pt Will Perform Lower Body Dressing: with min guard assist;sitting/lateral leans Pt Will Transfer to Toilet: with min guard assist;bedside commode Pt Will Perform Toileting - Clothing Manipulation and hygiene: sitting/lateral leans;with supervision  Plan Discharge plan remains appropriate;Frequency remains appropriate    Co-evaluation    PT/OT/SLP Co-Evaluation/Treatment: Yes Reason for Co-Treatment: For patient/therapist safety;To address functional/ADL transfers   OT goals addressed during session: ADL's and self-care;Proper use of Adaptive equipment and DME      AM-PAC OT "6 Clicks" Daily Activity     Outcome Measure   Help from another person eating meals?: None Help from another person taking care of personal grooming?: None Help from another person toileting, which includes using toliet, bedpan, or urinal?: A Little Help from another person bathing (including washing,  rinsing, drying)?: A Little Help from another person to put on and taking off regular upper body clothing?: A Little Help from another person to put on and taking off regular lower body clothing?: A Lot 6 Click Score: 19    End of Session Equipment Utilized During Treatment: Gait belt;Rolling walker (2 wheels)  OT Visit Diagnosis: Unsteadiness on feet (R26.81);Other abnormalities of gait and mobility (R26.89);Muscle weakness (generalized) (M62.81);Pain   Activity Tolerance Patient tolerated treatment well   Patient Left in bed;with bed alarm set;with family/visitor present;with call bell/phone within reach   Nurse Communication Mobility status;Patient requests pain meds        Time: 5277-8242 OT Time Calculation (min): 32 min  Charges: OT General Charges $OT Visit: 1 Visit OT Treatments $Self Care/Home Management : 8-22 mins  Lynnda Child, OTD, OTR/L Acute Rehab [phone]) [phone]   Kaylyn Lim 01/23/2021, 2:30 PM

## 2021-01-23 NOTE — Progress Notes (Signed)
PROGRESS NOTE    Shelby Walker  GGY:[id number] DOB: 1933/05/12 DOA: 01/16/2021 PCP: Reynold Bowen, MD     Brief Narrative:  KALESE ENSZ is an 87 year old F with PMH of DM-2, RA, CKD-3B, PAD, A. fib on Eliquis, HTN and HOH presenting with right foot wound and erythema with drainage, and admitted for diabetic right foot infection.   Previously, she had an ulcer on the bottom of right foot and took antibiotics and it healed; an aortogram on 10/19/20 showed adequate circulation for wound healing at that time.  She was last seen on 11/7 by Dr. Amalia Hailey for R foot edema/erythema and was thought to have gout; she was given an rx for meloxicam.  This time, she noticed a dorsal foot wound that spontaneously appeared on 11/13, and was apparently necrotic at that time.  She went to the ER and was given Doxy and referred to Dr. Doran Durand who saw her 11/14 and sent her for direct admission.     Patient was started on ceftriaxone, vancomycin and Flagyl. Underwent right TMT on 01/18/2021 by Dr. Doran Durand.  Antibiotics discontinued per recommendation by orthopedic surgery.  Therapy recommended CIR. CIR following.   New events last 24 hours / Subjective: Patient sitting in bed, eating breakfast. Pain is worst after ambulating but tolerable when at rest.   Assessment & Plan:   Principal Problem:   Diabetic foot infection ([place]) Active Problems:   PAD (peripheral artery disease) (HCC)   Mixed hyperlipidemia   Paroxysmal atrial fibrillation (HCC)   Essential hypertension   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   Uncontrolled type 2 diabetes mellitus with hyperglycemia (HCC)   Rheumatoid arthritis (HCC)   Stage 3b chronic kidney disease (CKD) ([place])   DNR (do not resuscitate)   Protein-calorie malnutrition, severe   Diabetic right foot infection -S/p right TMT amputation on 11/16 -Antibiotics discontinued given source control with surgery -WBAT on RLE in cam boot per Ortho -Therapy recommended CIR.   CR following.   Left shoulder pain/rotator cuff injury -Suspect supraspinatus tendon injury aggravated perioperatively.  Improving. -Therapy, Voltaren gel, lidocaine and pain meds   History of recurrent C diff infection -Completed antibiotics   CKD 3b -Stable    Anemia of chronic disease -Stable     IDDM-2 with hyperglycemia, hyperlipidemia, neuropathy and diabetic foot ulcer -Semglee, NovoLog, sliding scale insulin  Hyperlipidemia -Continue Lipitor   Paroxysmal A. fib -Continue home Coreg and Eliquis.   Hypotension -Continue Coreg   RA -Continue home prednisone 4 mg daily -Watch for adrenal insufficiency from chronic steroid use     DVT prophylaxis: Eliquis Code Status: DNR Family Communication: No family at bedside Disposition Plan:  Status is: Inpatient  Remains inpatient appropriate because: Awaiting CIR placement    Antimicrobials:  Anti-infectives (From admission, onward)    Start     Dose/Rate Route Frequency Ordered Stop   01/18/21 2020  vancomycin (VANCOCIN) powder  Status:  Discontinued          As needed 01/18/21 2021 01/18/21 2021   01/18/21 1723  vancomycin (VANCOCIN) 1-5 GM/200ML-% IVPB       Note to Pharmacy: Gregery Na   : cabinet override      01/18/21 1723 01/18/21 2032   01/18/21 1700  vancomycin (VANCOREADY) IVPB 1000 mg/200 mL        1,000 mg 200 mL/hr over 60 Minutes Intravenous Every 48 hours 01/16/21 1804 01/19/21 2000   01/18/21 0600  ceFAZolin (ANCEF) IVPB 2g/100 mL premix  Status:  Discontinued        2 g 200 mL/hr over 30 Minutes Intravenous On call to O.R. 01/17/21 2005 01/18/21 2124   01/17/21 1100  ceFEPIme (MAXIPIME) 1 g in sodium chloride 0.9 % 100 mL IVPB        1 g 200 mL/hr over 30 Minutes Intravenous Every 12 hours 01/17/21 1011 01/19/21 0933   01/16/21 1700  cefTRIAXone (ROCEPHIN) 2 g in sodium chloride 0.9 % 100 mL IVPB  Status:  Discontinued        2 g 200 mL/hr over 30 Minutes Intravenous Every 24 hours  01/16/21 1607 01/17/21 0953   01/16/21 1700  metroNIDAZOLE (FLAGYL) IVPB 500 mg        500 mg 100 mL/hr over 60 Minutes Intravenous Every 8 hours 01/16/21 1607 01/19/21 1558   01/16/21 1700  vancomycin (VANCOREADY) IVPB 1250 mg/250 mL        1,250 mg 166.7 mL/hr over 90 Minutes Intravenous  Once 01/16/21 1614 01/16/21 2324        Objective: Vitals:   01/22/21 1447 01/22/21 2104 01/23/21 0416 01/23/21 0729  BP: (!) 116/44 (!) 120/51 (!) 139/53 132/61  Pulse: 76 77 75 77  Resp: 16 20 18 15   Temp: 98.9 F (37.2 C) 98.2 F (36.8 C) 98 F (36.7 C) 98.4 F (36.9 C)  TempSrc: Oral Oral Oral Oral  SpO2: 95% 97% 95% 95%  Weight:      Height:        Intake/Output Summary (Last 24 hours) at 01/23/2021 1244 Last data filed at 01/23/2021 0900 Gross per 24 hour  Intake 360 ml  Output --  Net 360 ml    Filed Weights   01/16/21 1603  Weight: 54.5 kg    Examination:  General exam: Appears calm and comfortable  Respiratory system: Clear to auscultation. Respiratory effort normal. No respiratory distress. No conversational dyspnea.  Cardiovascular system: S1 & S2 heard, RRR. No murmurs. No pedal edema. Gastrointestinal system: Abdomen is nondistended, soft and nontender. Normal bowel sounds heard. Central nervous system: Alert and oriented. No focal neurological deficits. Speech clear.  Extremities: Right foot in dressing Psychiatry: Judgement and insight appear normal. Mood & affect appropriate.   Data Reviewed: I have personally reviewed following labs and imaging studies  CBC: Recent Labs  Lab 01/16/21 1642 01/17/21 0520 01/18/21 0132 01/19/21 0743  WBC 10.1 7.8 7.0 9.3  NEUTROABS 8.1*  --   --   --   HGB 10.6* 9.9* 9.3* 9.6*  HCT 32.4* 30.1* 29.1* 30.3*  MCV 93.1 92.6 93.6 93.2  PLT 269 244 244 341    Basic Metabolic Panel: Recent Labs  Lab 01/16/21 1642 01/17/21 0520 01/18/21 0132 01/19/21 0743  NA 139 142 139 140  K 5.2* 4.4 4.2 4.2  CL 103 107 108 109   CO2 27 26 21* 23  GLUCOSE 321* 117* 183* 123*  BUN 44* 40* 36* 20  CREATININE 1.25* 1.14* 1.30* 1.11*  CALCIUM 8.8* 8.2* 8.1* 8.0*  MG  --   --  1.9 1.7  PHOS  --   --  3.7 3.1    GFR: Estimated Creatinine Clearance: 28 mL/min (A) (by C-G formula based on SCr of 1.11 mg/dL (H)). Liver Function Tests: Recent Labs  Lab 01/16/21 1642 01/18/21 0132 01/19/21 0743  AST 17  --   --   ALT 18  --   --   ALKPHOS 55  --   --   BILITOT 0.6  --   --  PROT 6.0*  --   --   ALBUMIN 2.9* 2.4* 2.3*    No results for input(s): LIPASE, AMYLASE in the last 168 hours. No results for input(s): AMMONIA in the last 168 hours. Coagulation Profile: No results for input(s): INR, PROTIME in the last 168 hours. Cardiac Enzymes: No results for input(s): CKTOTAL, CKMB, CKMBINDEX, TROPONINI in the last 168 hours. BNP (last 3 results) No results for input(s): PROBNP in the last 8760 hours. HbA1C: No results for input(s): HGBA1C in the last 72 hours. CBG: Recent Labs  Lab 01/22/21 1315 01/22/21 1604 01/22/21 2059 01/23/21 0726 01/23/21 1200  GLUCAP 108* 156* 148* 165* 288*    Lipid Profile: No results for input(s): CHOL, HDL, LDLCALC, TRIG, CHOLHDL, LDLDIRECT in the last 72 hours. Thyroid Function Tests: No results for input(s): TSH, T4TOTAL, FREET4, T3FREE, THYROIDAB in the last 72 hours. Anemia Panel: No results for input(s): VITAMINB12, FOLATE, FERRITIN, TIBC, IRON, RETICCTPCT in the last 72 hours. Sepsis Labs: Recent Labs  Lab 01/16/21 1642  LATICACIDVEN 1.3     Recent Results (from the past 240 hour(s))  Blood Cultures x 2 sites     Status: None   Collection Time: 01/16/21  4:42 PM   Specimen: BLOOD  Result Value Ref Range Status   Specimen Description BLOOD LEFT ANTECUBITAL  Final   Special Requests   Final    BOTTLES DRAWN AEROBIC ONLY Blood Culture adequate volume   Culture   Final    NO GROWTH 5 DAYS Performed at [facility], [address], [place], [place]  [address]    Report Status 01/21/2021 FINAL  Final  Blood Cultures x 2 sites     Status: None   Collection Time: 01/16/21  4:42 PM   Specimen: BLOOD LEFT ARM  Result Value Ref Range Status   Specimen Description BLOOD LEFT ARM  Final   Special Requests   Final    BOTTLES DRAWN AEROBIC ONLY Blood Culture adequate volume   Culture   Final    NO GROWTH 5 DAYS Performed at [facility], [place] [address] [place], [place] [address]    Report Status 01/21/2021 FINAL  Final  Resp Panel by RT-PCR (Flu A&B, Covid) Nasopharyngeal Swab     Status: None   Collection Time: 01/16/21  5:12 PM   Specimen: Nasopharyngeal Swab; Nasopharyngeal(NP) swabs in vial transport medium  Result Value Ref Range Status   SARS Coronavirus 2 by RT PCR NEGATIVE NEGATIVE Final    Comment: (NOTE) SARS-CoV-2 target nucleic acids are NOT DETECTED.  The SARS-CoV-2 RNA is generally detectable in upper respiratory specimens during the acute phase of infection. The lowest concentration of SARS-CoV-2 viral copies this assay can detect is 138 copies/mL. A negative result does not preclude SARS-Cov-2 infection and should not be used as the sole basis for treatment or other patient management decisions. A negative result may occur with  improper specimen collection/handling, submission of specimen other than nasopharyngeal swab, presence of viral mutation(s) within the areas targeted by this assay, and inadequate number of viral copies(<138 copies/mL). A negative result must be combined with clinical observations, patient history, and epidemiological information. The expected result is Negative.  Fact Sheet for Patients:  EntrepreneurPulse.com.au  Fact Sheet for Healthcare Providers:  IncredibleEmployment.be  This test is no t yet approved or cleared by the [place] FDA and  has been authorized for detection and/or diagnosis of SARS-CoV-2 by FDA under an Emergency Use  Authorization (EUA). This EUA  will remain  in effect (meaning this test can be used) for the duration of the COVID-19 declaration under Section 564(b)(1) of the Act, 21 U.S.C.section 360bbb-3(b)(1), unless the authorization is terminated  or revoked sooner.       Influenza A by PCR NEGATIVE NEGATIVE Final   Influenza B by PCR NEGATIVE NEGATIVE Final    Comment: (NOTE) The Xpert Xpress SARS-CoV-2/FLU/RSV plus assay is intended as an aid in the diagnosis of influenza from Nasopharyngeal swab specimens and should not be used as a sole basis for treatment. Nasal washings and aspirates are unacceptable for Xpert Xpress SARS-CoV-2/FLU/RSV testing.  Fact Sheet for Patients: EntrepreneurPulse.com.au  Fact Sheet for Healthcare Providers: IncredibleEmployment.be  This test is not yet approved or cleared by the [place] FDA and has been authorized for detection and/or diagnosis of SARS-CoV-2 by FDA under an Emergency Use Authorization (EUA). This EUA will remain in effect (meaning this test can be used) for the duration of the COVID-19 declaration under Section 564(b)(1) of the Act, 21 U.S.C. section 360bbb-3(b)(1), unless the authorization is terminated or revoked.  Performed at [facility], [place] [address] [place], [place] [address]        Radiology Studies: No results found.    Scheduled Meds:  apixaban  2.5 mg Oral BID   atorvastatin  20 mg Oral QHS   carvedilol  3.125 mg Oral BID WC   cycloSPORINE  1 drop Both Eyes BID   diclofenac Sodium  2 g Topical QID   feeding supplement  237 mL Oral BID BM   insulin aspart  0-5 Units Subcutaneous QHS   insulin aspart  0-9 Units Subcutaneous TID WC   insulin aspart  3 Units Subcutaneous TID WC   insulin glargine-yfgn  8 Units Subcutaneous Daily   lidocaine  1 patch Transdermal Q24H   multivitamin  1 tablet Oral QHS   pantoprazole  40 mg Oral Daily   predniSONE  4 mg Oral q AM    saccharomyces boulardii  250 mg Oral BID   Continuous Infusions:   LOS: 7 days      Time spent: 20 minutes   Dessa Phi, DO [facility] 01/23/2021, 12:44 PM   Available via Epic secure chat 7am-7pm After these hours, please refer to coverage provider listed on [email or web address]

## 2021-01-23 NOTE — Anesthesia Postprocedure Evaluation (Signed)
Anesthesia Post Note  Patient: Shelby Walker  Procedure(s) Performed: TRANSMETATARSAL AMPUTATION (Right: Toe)     Patient location during evaluation: PACU Anesthesia Type: Regional and MAC Level of consciousness: awake and alert Pain management: pain level controlled Vital Signs Assessment: post-procedure vital signs reviewed and stable Respiratory status: spontaneous breathing, nonlabored ventilation, respiratory function stable and patient connected to nasal cannula oxygen Cardiovascular status: stable and blood pressure returned to baseline Postop Assessment: no apparent nausea or vomiting Anesthetic complications: no   No notable events documented.  Last Vitals:  Vitals:   01/23/21 0416 01/23/21 0729  BP: (!) 139/53 132/61  Pulse: 75 77  Resp: 18 15  Temp: 36.7 C 36.9 C  SpO2: 95% 95%    Last Pain:  Vitals:   01/23/21 0745  TempSrc:   PainSc: 0-No pain                 Shayana Hornstein S

## 2021-01-23 NOTE — Progress Notes (Signed)
Physical Therapy Treatment Patient Details Name: Shelby Walker MRN: [id number] DOB: 1934-01-12 Today's Date: 01/23/2021   History of Present Illness 87 yo female s/p R transmet amputation due to gangrene. PMH includes cdiff, DM, GERD, osteoporosis, RA.    PT Comments    Pt was seen for mobility on RW with cam boot applied by using straps across padding, with light pressure and flipped end of shorter strap to velcro the pieces across the boot.  Pt is more comfortable in the boot toda, apparently having reduced her edema. Follow along with her to work on standing tolerance, safety of standing balance in the boot and to increase stability of gait in boot.  Pt needed dense instruction for reduction of R foot step length and to control tendency to keep front of walker too close to balance safely. Follow up as tolerated for goals of PT with cam boot for now.   Recommendations for follow up therapy are one component of a multi-disciplinary discharge planning process, led by the attending physician.  Recommendations may be updated based on patient status, additional functional criteria and insurance authorization.  Follow Up Recommendations  Acute inpatient rehab (3hours/day)     Assistance Recommended at Discharge Frequent or constant Supervision/Assistance  Equipment Recommendations  None recommended by PT    Recommendations for Other Services Rehab consult     Precautions / Restrictions Precautions Precautions: Fall Required Braces or Orthoses: Other Brace Other Brace: CAM boot when OOB and when WB RLE Restrictions Weight Bearing Restrictions: Yes RLE Weight Bearing: Non weight bearing Other Position/Activity Restrictions: CAM boot when OOB and when WB RLE     Mobility  Bed Mobility Overal bed mobility: Needs Assistance Bed Mobility: Sit to Supine       Sit to supine: Min guard        Transfers Overall transfer level: Needs assistance Equipment used: Rolling walker (2  wheels) Transfers: Sit to/from Stand;Bed to chair/wheelchair/BSC Sit to Stand: Min assist           General transfer comment: initial transfer more difficult then instructed pt on use of cam boot with better control of balance    Ambulation/Gait Ambulation/Gait assistance: Min assist;+2 physical assistance;+2 safety/equipment;Mod assist Gait Distance (Feet): 28 Feet (in three trips) Assistive device: Rolling walker (2 wheels);1 person hand held assist;Pushed wheelchair Gait Pattern/deviations: Step-through pattern;Step-to pattern;Decreased stride length;Decreased weight shift to right Gait velocity: reduced   Pre-gait activities: wgt shift in cam boot General Gait Details: donned cam boot with modified fit to pad top of foot and brought straps across the top and closed velcro without d ring   Stairs             Wheelchair Mobility    Modified Rankin (Stroke Patients Only)       Balance Overall balance assessment: Needs assistance Sitting-balance support: Feet supported Sitting balance-Leahy Scale: Good     Standing balance support: Bilateral upper extremity supported;During functional activity Standing balance-Leahy Scale: Poor Standing balance comment: pt required instruction to control her                            Cognition Arousal/Alertness: Awake/alert Behavior During Therapy: WFL for tasks assessed/performed Overall Cognitive Status: Within Functional Limits for tasks assessed Area of Impairment: Safety/judgement;Awareness;Problem solving                         Safety/Judgement: Decreased awareness of safety  Exercises      General Comments General comments (skin integrity, edema, etc.): pt is seen with family in attendance and are supportive of rehab and further therapy      Pertinent Vitals/Pain Pain Assessment: Faces Faces Pain Scale: Hurts little more Pain Location: R foot Pain Descriptors /  Indicators: Discomfort;Grimacing;Guarding Pain Intervention(s): Monitored during session;Limited activity within patient's tolerance;Premedicated before session;Repositioned    Home Living                          Prior Function            PT Goals (current goals can now be found in the care plan section) Acute Rehab PT Goals Patient Stated Goal: to get home Progress towards PT goals: Progressing toward goals    Frequency    Min 4X/week      PT Plan Current plan remains appropriate    Co-evaluation PT/OT/SLP Co-Evaluation/Treatment: Yes Reason for Co-Treatment: Complexity of the patient's impairments (multi-system involvement);For patient/therapist safety PT goals addressed during session: Mobility/safety with mobility;Balance        AM-PAC PT "6 Clicks" Mobility   Outcome Measure  Help needed turning from your back to your side while in a flat bed without using bedrails?: A Little Help needed moving from lying on your back to sitting on the side of a flat bed without using bedrails?: A Little Help needed moving to and from a bed to a chair (including a wheelchair)?: A Lot Help needed standing up from a chair using your arms (e.g., wheelchair or bedside chair)?: A Lot Help needed to walk in hospital room?: A Lot Help needed climbing 3-5 steps with a railing? : Total 6 Click Score: 13    End of Session Equipment Utilized During Treatment: Gait belt;Other (comment) (R cam boot) Activity Tolerance: Treatment limited secondary to medical complications (Comment);Patient limited by pain Patient left: in bed;with call bell/phone within reach;with bed alarm set;with family/visitor present Nurse Communication: Mobility status PT Visit Diagnosis: Unsteadiness on feet (R26.81);Pain Pain - Right/Left: Right Pain - part of body: Ankle and joints of foot     Time: 1341-1413 PT Time Calculation (min) (ACUTE ONLY): 32 min  Charges:  $Gait Training: 8-22 mins             Ramond Dial 01/23/2021, 9:47 PM  Mee Hives, PT PhD Acute Rehab Dept. Number: [place] [facility] [place]

## 2021-01-23 NOTE — Progress Notes (Signed)
   01/23/21 1045  Mobility  Activity Refused mobility;Contraindicated/medical hold (Waiting for orthotic per family member)

## 2021-01-23 NOTE — Transfer of Care (Signed)
Immediate Anesthesia Transfer of Care Note  Patient: Candelaria Stagers Mccants  Procedure(s) Performed: TRANSMETATARSAL AMPUTATION (Right: Toe)  Patient Location: PACU  Anesthesia Type:MAC  Level of Consciousness: awake  Airway & Oxygen Therapy: Patient Spontanous Breathing  Post-op Assessment: Report given to RN  Post vital signs: Reviewed and stable  Last Vitals:  Vitals Value Taken Time  BP 132/61 01/23/21 0729  Temp 36.9 C 01/23/21 0729  Pulse 77 01/23/21 0729  Resp 15 01/23/21 0729  SpO2 95 % 01/23/21 0729    Last Pain:  Vitals:   01/23/21 0745  TempSrc:   PainSc: 0-No pain      Patients Stated Pain Goal: 2 (95/18/84 1660)  Complications: No notable events documented.

## 2021-01-24 ENCOUNTER — Other Ambulatory Visit: Payer: Self-pay

## 2021-01-24 ENCOUNTER — Inpatient Hospital Stay (HOSPITAL_COMMUNITY)
Admission: RE | Admit: 2021-01-24 | Discharge: 2021-02-06 | DRG: 560 | Disposition: A | Discharge status: Home Health | Payer: Medicare Other | Source: Intra-hospital | Attending: Physical Medicine and Rehabilitation | Admitting: Physical Medicine and Rehabilitation

## 2021-01-24 ENCOUNTER — Encounter (HOSPITAL_COMMUNITY): Payer: Self-pay | Admitting: Physical Medicine and Rehabilitation

## 2021-01-24 DIAGNOSIS — L97419 Non-pressure chronic ulcer of right heel and midfoot with unspecified severity: Secondary | ICD-10-CM | POA: Diagnosis present

## 2021-01-24 DIAGNOSIS — I209 Angina pectoris, unspecified: Secondary | ICD-10-CM | POA: Diagnosis present

## 2021-01-24 DIAGNOSIS — I129 Hypertensive chronic kidney disease with stage 1 through stage 4 chronic kidney disease, or unspecified chronic kidney disease: Secondary | ICD-10-CM | POA: Diagnosis present

## 2021-01-24 DIAGNOSIS — E1142 Type 2 diabetes mellitus with diabetic polyneuropathy: Secondary | ICD-10-CM | POA: Diagnosis present

## 2021-01-24 DIAGNOSIS — Z8249 Family history of ischemic heart disease and other diseases of the circulatory system: Secondary | ICD-10-CM

## 2021-01-24 DIAGNOSIS — M25561 Pain in right knee: Secondary | ICD-10-CM | POA: Diagnosis present

## 2021-01-24 DIAGNOSIS — I340 Nonrheumatic mitral (valve) insufficiency: Secondary | ICD-10-CM | POA: Diagnosis present

## 2021-01-24 DIAGNOSIS — N1832 Chronic kidney disease, stage 3b: Secondary | ICD-10-CM | POA: Diagnosis present

## 2021-01-24 DIAGNOSIS — Z89431 Acquired absence of right foot: Secondary | ICD-10-CM | POA: Diagnosis not present

## 2021-01-24 DIAGNOSIS — R509 Fever, unspecified: Secondary | ICD-10-CM | POA: Diagnosis not present

## 2021-01-24 DIAGNOSIS — E785 Hyperlipidemia, unspecified: Secondary | ICD-10-CM | POA: Diagnosis present

## 2021-01-24 DIAGNOSIS — M25512 Pain in left shoulder: Secondary | ICD-10-CM | POA: Diagnosis not present

## 2021-01-24 DIAGNOSIS — N289 Disorder of kidney and ureter, unspecified: Secondary | ICD-10-CM | POA: Diagnosis not present

## 2021-01-24 DIAGNOSIS — Z794 Long term (current) use of insulin: Secondary | ICD-10-CM

## 2021-01-24 DIAGNOSIS — Z89432 Acquired absence of left foot: Secondary | ICD-10-CM | POA: Diagnosis not present

## 2021-01-24 DIAGNOSIS — Z7901 Long term (current) use of anticoagulants: Secondary | ICD-10-CM

## 2021-01-24 DIAGNOSIS — K5900 Constipation, unspecified: Secondary | ICD-10-CM | POA: Diagnosis not present

## 2021-01-24 DIAGNOSIS — E46 Unspecified protein-calorie malnutrition: Secondary | ICD-10-CM | POA: Diagnosis present

## 2021-01-24 DIAGNOSIS — G8918 Other acute postprocedural pain: Secondary | ICD-10-CM

## 2021-01-24 DIAGNOSIS — M810 Age-related osteoporosis without current pathological fracture: Secondary | ICD-10-CM | POA: Diagnosis present

## 2021-01-24 DIAGNOSIS — M069 Rheumatoid arthritis, unspecified: Secondary | ICD-10-CM | POA: Diagnosis present

## 2021-01-24 DIAGNOSIS — G546 Phantom limb syndrome with pain: Secondary | ICD-10-CM | POA: Diagnosis present

## 2021-01-24 DIAGNOSIS — Z882 Allergy status to sulfonamides status: Secondary | ICD-10-CM

## 2021-01-24 DIAGNOSIS — Z4781 Encounter for orthopedic aftercare following surgical amputation: Principal | ICD-10-CM

## 2021-01-24 DIAGNOSIS — Z7952 Long term (current) use of systemic steroids: Secondary | ICD-10-CM

## 2021-01-24 DIAGNOSIS — M7522 Bicipital tendinitis, left shoulder: Secondary | ICD-10-CM | POA: Diagnosis present

## 2021-01-24 DIAGNOSIS — R7309 Other abnormal glucose: Secondary | ICD-10-CM

## 2021-01-24 DIAGNOSIS — I700 Atherosclerosis of aorta: Secondary | ICD-10-CM | POA: Diagnosis not present

## 2021-01-24 DIAGNOSIS — Z888 Allergy status to other drugs, medicaments and biological substances status: Secondary | ICD-10-CM

## 2021-01-24 DIAGNOSIS — E1122 Type 2 diabetes mellitus with diabetic chronic kidney disease: Secondary | ICD-10-CM | POA: Diagnosis present

## 2021-01-24 DIAGNOSIS — Z885 Allergy status to narcotic agent status: Secondary | ICD-10-CM

## 2021-01-24 DIAGNOSIS — I4891 Unspecified atrial fibrillation: Secondary | ICD-10-CM | POA: Diagnosis present

## 2021-01-24 DIAGNOSIS — Z833 Family history of diabetes mellitus: Secondary | ICD-10-CM

## 2021-01-24 DIAGNOSIS — Z823 Family history of stroke: Secondary | ICD-10-CM

## 2021-01-24 DIAGNOSIS — J208 Acute bronchitis due to other specified organisms: Secondary | ICD-10-CM

## 2021-01-24 DIAGNOSIS — E11621 Type 2 diabetes mellitus with foot ulcer: Secondary | ICD-10-CM | POA: Diagnosis present

## 2021-01-24 DIAGNOSIS — I480 Paroxysmal atrial fibrillation: Secondary | ICD-10-CM | POA: Diagnosis not present

## 2021-01-24 DIAGNOSIS — I10 Essential (primary) hypertension: Secondary | ICD-10-CM | POA: Diagnosis not present

## 2021-01-24 DIAGNOSIS — Z79899 Other long term (current) drug therapy: Secondary | ICD-10-CM

## 2021-01-24 DIAGNOSIS — D62 Acute posthemorrhagic anemia: Secondary | ICD-10-CM | POA: Diagnosis present

## 2021-01-24 DIAGNOSIS — M1990 Unspecified osteoarthritis, unspecified site: Secondary | ICD-10-CM | POA: Diagnosis present

## 2021-01-24 DIAGNOSIS — E119 Type 2 diabetes mellitus without complications: Secondary | ICD-10-CM

## 2021-01-24 LAB — GLUCOSE, CAPILLARY
Glucose-Capillary: 121 mg/dL — ABNORMAL HIGH (ref 70–99)
Glucose-Capillary: 198 mg/dL — ABNORMAL HIGH (ref 70–99)
Glucose-Capillary: 268 mg/dL — ABNORMAL HIGH (ref 70–99)
Glucose-Capillary: 240 mg/dL — ABNORMAL HIGH (ref 70–99)

## 2021-01-24 MED ORDER — ALUM & MAG HYDROXIDE-SIMETH 200-200-20 MG/5ML PO SUSP: 30.0000 mL | ORAL | Status: DC | PRN

## 2021-01-24 MED ORDER — ATORVASTATIN CALCIUM 10 MG PO TABS
20.0000 mg | ORAL_TABLET | Freq: Every day | ORAL | Status: DC
  Administered 2021-01-24 – 2021-02-05 (×13): 20 mg via ORAL
  Filled 2021-01-24 (×13): qty 2

## 2021-01-24 MED ORDER — PREDNISONE 1 MG PO TABS
4.0000 mg | ORAL_TABLET | Freq: Every morning | ORAL | Status: DC
  Administered 2021-01-25 – 2021-02-06 (×13): 4 mg via ORAL
  Filled 2021-01-24 (×14): qty 4

## 2021-01-24 MED ORDER — INSULIN GLARGINE-YFGN 100 UNIT/ML ~~LOC~~ SOLN
8.0000 [IU] | Freq: Every day | SUBCUTANEOUS | Status: DC
  Administered 2021-01-25 – 2021-02-06 (×12): 8 [IU] via SUBCUTANEOUS
  Filled 2021-01-24 (×15): qty 0.08

## 2021-01-24 MED ORDER — DICLOFENAC SODIUM 1 % EX GEL: 2.0000 g | Freq: Four times a day (QID) | CUTANEOUS | Status: DC

## 2021-01-24 MED ORDER — TRAZODONE HCL 50 MG PO TABS
25.0000 mg | ORAL_TABLET | Freq: Every evening | ORAL | Status: DC | PRN
  Administered 2021-01-28: 50 mg via ORAL
  Administered 2021-01-29: 23:00:00 25 mg via ORAL
  Filled 2021-01-24 (×3): qty 1

## 2021-01-24 MED ORDER — SACCHAROMYCES BOULARDII 250 MG PO CAPS
250.0000 mg | ORAL_CAPSULE | Freq: Two times a day (BID) | ORAL | Status: DC
  Administered 2021-01-24 – 2021-02-06 (×26): 250 mg via ORAL
  Filled 2021-01-24 (×26): qty 1

## 2021-01-24 MED ORDER — ONDANSETRON HCL 4 MG PO TABS
4.0000 mg | ORAL_TABLET | Freq: Four times a day (QID) | ORAL | Status: DC | PRN
  Administered 2021-01-29 – 2021-01-30 (×2): 4 mg via ORAL
  Filled 2021-01-24 (×4): qty 1

## 2021-01-24 MED ORDER — CYCLOSPORINE 0.05 % OP EMUL
1.0000 [drp] | Freq: Two times a day (BID) | OPHTHALMIC | Status: DC
  Administered 2021-01-24 – 2021-02-06 (×26): 1 [drp] via OPHTHALMIC
  Filled 2021-01-24 (×29): qty 30

## 2021-01-24 MED ORDER — INSULIN ASPART 100 UNIT/ML IJ SOLN
3.0000 [IU] | Freq: Three times a day (TID) | INTRAMUSCULAR | Status: DC
  Administered 2021-01-24 – 2021-01-30 (×11): 3 [IU] via SUBCUTANEOUS

## 2021-01-24 MED ORDER — ENSURE MAX PROTEIN PO LIQD
11.0000 [oz_av] | Freq: Two times a day (BID) | ORAL | Status: DC
  Administered 2021-01-24 – 2021-02-06 (×26): 11 [oz_av] via ORAL

## 2021-01-24 MED ORDER — RENA-VITE PO TABS
1.0000 | ORAL_TABLET | Freq: Every day | ORAL | Status: DC
  Administered 2021-01-24 – 2021-02-05 (×13): 1 via ORAL
  Filled 2021-01-24 (×13): qty 1

## 2021-01-24 MED ORDER — DIPHENHYDRAMINE HCL 12.5 MG/5ML PO ELIX: 12.5000 mg | ORAL_SOLUTION | Freq: Four times a day (QID) | ORAL | Status: DC | PRN

## 2021-01-24 MED ORDER — APIXABAN 2.5 MG PO TABS
2.5000 mg | ORAL_TABLET | Freq: Two times a day (BID) | ORAL | Status: DC
  Administered 2021-01-24 – 2021-02-06 (×26): 2.5 mg via ORAL
  Filled 2021-01-24 (×26): qty 1

## 2021-01-24 MED ORDER — CARVEDILOL 3.125 MG PO TABS
3.1250 mg | ORAL_TABLET | Freq: Two times a day (BID) | ORAL | Status: DC
  Administered 2021-01-24 – 2021-02-02 (×19): 3.125 mg via ORAL
  Filled 2021-01-24 (×20): qty 1

## 2021-01-24 MED ORDER — JUVEN PO PACK
1.0000 | PACK | Freq: Two times a day (BID) | ORAL | Status: DC
  Administered 2021-01-25 – 2021-02-05 (×18): 1 via ORAL
  Filled 2021-01-24 (×15): qty 1

## 2021-01-24 MED ORDER — LIDOCAINE 5 % EX PTCH
1.0000 | MEDICATED_PATCH | CUTANEOUS | Status: DC
  Administered 2021-01-25 – 2021-02-06 (×12): 1 via TRANSDERMAL
  Filled 2021-01-24 (×13): qty 1

## 2021-01-24 MED ORDER — FLEET ENEMA 7-19 GM/118ML RE ENEM: 1.0000 | ENEMA | Freq: Once | RECTAL | Status: DC | PRN

## 2021-01-24 MED ORDER — BISACODYL 10 MG RE SUPP: 10.0000 mg | Freq: Every day | RECTAL | Status: DC | PRN

## 2021-01-24 MED ORDER — POLYETHYLENE GLYCOL 3350 17 G PO PACK
17.0000 g | PACK | Freq: Every day | ORAL | Status: DC | PRN
  Administered 2021-01-24: 17 g via ORAL
  Filled 2021-01-24: qty 1

## 2021-01-24 MED ORDER — ONDANSETRON HCL 4 MG/2ML IJ SOLN: 4.0000 mg | Freq: Four times a day (QID) | INTRAMUSCULAR | Status: DC | PRN

## 2021-01-24 MED ORDER — PANTOPRAZOLE SODIUM 40 MG PO TBEC
40.0000 mg | DELAYED_RELEASE_TABLET | Freq: Every day | ORAL | Status: DC
  Administered 2021-01-25 – 2021-02-06 (×13): 40 mg via ORAL
  Filled 2021-01-24 (×13): qty 1

## 2021-01-24 MED ORDER — GUAIFENESIN-DM 100-10 MG/5ML PO SYRP: 5.0000 mL | ORAL_SOLUTION | Freq: Four times a day (QID) | ORAL | Status: DC | PRN

## 2021-01-24 MED ORDER — OXYCODONE HCL 5 MG PO TABS: 5.0000 mg | ORAL_TABLET | ORAL | Status: DC | PRN

## 2021-01-24 MED ORDER — LIP MEDEX EX OINT
TOPICAL_OINTMENT | CUTANEOUS | Status: DC | PRN
  Filled 2021-01-24: qty 7

## 2021-01-24 MED ORDER — INSULIN ASPART 100 UNIT/ML IJ SOLN
0.0000 [IU] | Freq: Three times a day (TID) | INTRAMUSCULAR | Status: DC
  Administered 2021-01-24: 5 [IU] via SUBCUTANEOUS
  Administered 2021-01-25: 2 [IU] via SUBCUTANEOUS
  Administered 2021-01-25: 3 [IU] via SUBCUTANEOUS
  Administered 2021-01-25 – 2021-01-26 (×2): 2 [IU] via SUBCUTANEOUS
  Administered 2021-01-26: 5 [IU] via SUBCUTANEOUS
  Administered 2021-01-27: 2 [IU] via SUBCUTANEOUS
  Administered 2021-01-27: 9 [IU] via SUBCUTANEOUS
  Administered 2021-01-28: 3 [IU] via SUBCUTANEOUS
  Administered 2021-01-28 (×2): 2 [IU] via SUBCUTANEOUS
  Administered 2021-01-29: 12:00:00 9 [IU] via SUBCUTANEOUS
  Administered 2021-01-30: 09:00:00 1 [IU] via SUBCUTANEOUS
  Administered 2021-01-30: 17:00:00 3 [IU] via SUBCUTANEOUS

## 2021-01-24 MED ORDER — INSULIN ASPART 100 UNIT/ML IJ SOLN
0.0000 [IU] | Freq: Every day | INTRAMUSCULAR | Status: DC
  Administered 2021-01-24: 2 [IU] via SUBCUTANEOUS
  Administered 2021-01-29: 22:00:00 3 [IU] via SUBCUTANEOUS

## 2021-01-24 MED ORDER — GABAPENTIN 100 MG PO CAPS
100.0000 mg | ORAL_CAPSULE | Freq: Three times a day (TID) | ORAL | Status: DC | PRN
  Administered 2021-01-25 – 2021-01-28 (×6): 100 mg via ORAL
  Filled 2021-01-24 (×6): qty 1

## 2021-01-24 MED ORDER — ACETAMINOPHEN 325 MG PO TABS
325.0000 mg | ORAL_TABLET | ORAL | Status: DC | PRN
  Administered 2021-01-24 – 2021-01-29 (×7): 650 mg via ORAL
  Filled 2021-01-24 (×8): qty 2

## 2021-01-24 MED ORDER — DICLOFENAC SODIUM 1 % EX GEL
2.0000 g | Freq: Four times a day (QID) | CUTANEOUS | Status: DC
  Administered 2021-01-24 – 2021-02-06 (×44): 2 g via TOPICAL
  Filled 2021-01-24: qty 100

## 2021-01-24 MED ORDER — TRAMADOL HCL 50 MG PO TABS
50.0000 mg | ORAL_TABLET | Freq: Four times a day (QID) | ORAL | Status: DC | PRN
  Administered 2021-01-24 – 2021-02-03 (×10): 50 mg via ORAL
  Filled 2021-01-24 (×10): qty 1

## 2021-01-24 NOTE — Progress Notes (Signed)
Patient ID: Shelby Walker, female   DOB: Feb 19, 1934, 87 y.o.   MRN: [id number]  Pt arrive to 4M12 per bed. Family at bedside. Policies reviewed. Pt/family oriented to rehab. Assessment complete, vitals obtained. MASD found to sacrum and left heel boggy. Prophylactic foam applied to left heel and pericare performed and barrier cream applied. Pt call light in reach, bed alarm on and in lowest position.  [name], LPN

## 2021-01-24 NOTE — Progress Notes (Signed)
  Mobility Specialist Criteria Algorithm Info.  01/24/21 1015  Pain Assessment  Pain Score 5  Pain Descriptors / Indicators Grimacing;Discomfort  Pain Intervention(s) Patient requesting pain meds-RN notified  Mobility  Activity Ambulated in room;Transferred:  Bed to chair  Range of Motion/Exercises Active;All extremities  Level of Assistance Moderate assist, patient does 50-74% (Min A/min G ambulating)  Assistive Device Front wheel walker  RLE Weight Bearing WBAT  Distance Ambulated (ft) 5 ft  Mobility Ambulated with assistance in room  Mobility Response Tolerated well  Mobility performed by Mobility specialist  Bed Position Chair      Patient received in bed eager to participate in mobility this morning. Declined extended distance secondary to pain but agreeable to take step to recliner chair. Got to EOB independently and instructed pt on how to don CAM boot with mod A. Stood from EOB mod A + cues for technique and took steps to recliner with Min A to steady. Tolerated well with complaints of pain requesting pain medication from RN. Was left with all needs met and nursing student present. RN notified.  01/24/2021 11:46 AM

## 2021-01-24 NOTE — Discharge Summary (Addendum)
Physician Discharge Summary  Shelby Walker RSW:[id number] DOB: [birth year] DOA: 01/16/2021  PCP: Reynold Bowen, MD  Admit date: 01/16/2021 Discharge date: 01/24/2021  Admitted From: Home Disposition:  CIR   Recommendations for Outpatient Follow-up:  Follow up with PCP in 1 week Follow up with orthopedic surgery, Dr. Doran Durand in 2 weeks   Discharge Condition: Stable CODE STATUS: DNR  Diet recommendation: Carb modified   Brief/Interim Summary: Shelby Walker is an 87 year old F with PMH of DM-2, RA, CKD-3B, PAD, A. fib on Eliquis, HTN and HOH presenting with right foot wound and erythema with drainage, and admitted for diabetic right foot infection.   Previously, she had an ulcer on the bottom of right foot and took antibiotics and it healed; an aortogram on 10/19/20 showed adequate circulation for wound healing at that time.  She was last seen on 11/7 by Dr. Amalia Hailey for R foot edema/erythema and was thought to have gout; she was given an rx for meloxicam.  This time, she noticed a dorsal foot wound that spontaneously appeared on 11/13, and was apparently necrotic at that time.  She went to the ER and was given Doxy and referred to Dr. Doran Durand who saw her 11/14 and sent her for direct admission.     Patient was started on ceftriaxone, vancomycin and Flagyl. Underwent right TMT on 01/18/2021 by Dr. Doran Durand.  Antibiotics discontinued per recommendation by orthopedic surgery.  Therapy recommended CIR.  Discharge Diagnoses:  Principal Problem:   Diabetic foot infection ([place]) Active Problems:   PAD (peripheral artery disease) ([place])   Mixed hyperlipidemia   Paroxysmal atrial fibrillation (HCC)   Essential hypertension   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   Uncontrolled type 2 diabetes mellitus with hyperglycemia (HCC)   Rheumatoid arthritis (HCC)   Stage 3b chronic kidney disease (CKD) ([place])   DNR (do not resuscitate)   Protein-calorie malnutrition, severe     Diabetic right foot  infection -S/p right TMT amputation on 11/16 -Antibiotics discontinued given source control with surgery -WBAT on RLE in cam boot per Ortho -Therapy recommended CIR   Left shoulder pain/rotator cuff injury -Suspect supraspinatus tendon injury aggravated perioperatively.  Improving. -Therapy, Voltaren gel, lidocaine and pain meds   History of recurrent C diff infection -Completed antibiotics   CKD 3b -Stable    Anemia of chronic disease -Stable     IDDM-2 with hyperglycemia, hyperlipidemia, neuropathy and diabetic foot ulcer -Semglee, NovoLog, sliding scale insulin   Hyperlipidemia -Continue Lipitor   Paroxysmal A. fib -Continue home Coreg and Eliquis.   Hypertension -Continue Coreg   RA -Continue home prednisone 4 mg daily   Discharge Instructions   Allergies as of 01/24/2021       Reactions   Cholestyramine    Possible- Makes throat burn. Patient said she can't take it    Compazine [prochlorperazine Edisylate] Swelling   REACTION: " tongue swell and unable to swallow"   Sulfonamide Derivatives Other (See Comments)   REACTION: " broke out with fine itching bumps"   Hydrocodone Other (See Comments)   Infliximab    Other reaction(s): rash   Prochlorperazine    Other reaction(s): Unknown   Rosuvastatin    Other reaction(s): muscle aches   Sulfa Antibiotics    Other reaction(s): tongue swelling        Medication List     STOP taking these medications    acetaminophen-codeine 300-30 MG tablet Commonly known as: TYLENOL #3   doxycycline 100 MG capsule Commonly known as: VIBRAMYCIN  TAKE these medications    acetaminophen 325 MG tablet Commonly known as: TYLENOL Take 2 tablets (650 mg total) by mouth every 6 (six) hours as needed for mild pain (or Fever >/= 101).   apixaban 2.5 MG Tabs tablet Commonly known as: Eliquis Take 1 tablet by mouth 2 times daily What changed:  how much to take how to take this when to take this additional  instructions   atorvastatin 20 MG tablet Commonly known as: LIPITOR Take 1 tablet by mouth once daily What changed: when to take this   Calcium Carbonate-Vitamin D 600-400 MG-UNIT tablet Take 1 tablet by mouth daily at 12 noon.   carvedilol 3.125 MG tablet Commonly known as: COREG TAKE 1 TABLET BY MOUTH TWICE DAILY WITH MEALS   CENTRUM SILVER 50+WOMEN PO Take 1 tablet by mouth daily.   Cholecalciferol 25 MCG (1000 UT) capsule Take 1 capsule (1,000 Units total) by mouth daily.   docusate sodium 100 MG capsule Commonly known as: Colace Take 1 capsule (100 mg total) by mouth 2 (two) times daily. While taking narcotic pain medicine.   Ensure Active Liqd Take 237 mLs by mouth in the morning. Prefers Chocolate   esomeprazole 20 MG capsule Commonly known as: NEXIUM Take 20 mg by mouth in the morning.   gabapentin 100 MG capsule Commonly known as: NEURONTIN Take 1 capsule (100 mg total) by mouth 3 (three) times daily as needed. What changed: reasons to take this   insulin aspart 100 UNIT/ML FlexPen Commonly known as: NOVOLOG Inject 6 Units into the skin 3 (three) times daily with meals. Sliding scale   nitroGLYCERIN 0.4 MG SL tablet Commonly known as: NITROSTAT Place 1 tablet (0.4 mg total) under the tongue every 5 (five) minutes as needed for chest pain.   predniSONE 1 MG tablet Commonly known as: DELTASONE Take 4 mg by mouth in the morning.   Restasis 0.05 % ophthalmic emulsion Generic drug: cycloSPORINE Place 1 drop into both eyes 2 (two) times daily.   Selsun Blue Dry Scalp 1 % shampoo Generic drug: pyrithione zinc Apply 1 application topically once a week.   senna 8.6 MG Tabs tablet Commonly known as: SENOKOT Take 2 tablets (17.2 mg total) by mouth 2 (two) times daily.   traMADol 50 MG tablet Commonly known as: ULTRAM Take 50 mg by mouth every 6 (six) hours as needed for moderate pain.   TRESIBA Chenoweth Inject 6-8 Units into the skin at bedtime. Sliding  scale       ASK your doctor about these medications    oxyCODONE 5 MG immediate release tablet Commonly known as: Roxicodone Take 1 tablet (5 mg total) by mouth every 4 (four) hours as needed for up to 3 days for severe pain. Ask about: Should I take this medication?        Follow-up Information     Reynold Bowen, MD Follow up.   Specialty: Endocrinology Contact information: [place] [address] [phone]         Wylene Simmer, MD. Schedule an appointment as soon as possible for a visit in 2 week(s).   Specialty: Orthopedic Surgery Contact information: [address] [place]  [phone]                Allergies  Allergen Reactions   Cholestyramine     Possible- Makes throat burn. Patient said she can't take it    Compazine [Prochlorperazine Edisylate] Swelling    REACTION: " tongue swell  and unable to swallow"   Sulfonamide Derivatives Other (See Comments)    REACTION: " broke out with fine itching bumps"   Hydrocodone Other (See Comments)   Infliximab     Other reaction(s): rash   Prochlorperazine     Other reaction(s): Unknown   Rosuvastatin     Other reaction(s): muscle aches   Sulfa Antibiotics     Other reaction(s): tongue swelling      Procedures/Studies: Epidural Steroid injection  Result Date: 01/09/2021 Magnus Sinning, MD     01/15/2021  4:18 PM Lumbosacral Transforaminal Epidural Steroid Injection - Sub-Pedicular Approach with Fluoroscopic Guidance Patient: WYLENE WEISSMAN     Date of Birth: January 06, 1934 MRN: [id number] PCP: Reynold Bowen, MD     Visit Date: 01/09/2021  Universal Protocol:   Date/Time: 01/09/2021 Consent Given By: the patient Position: PRONE Additional Comments: Vital signs were monitored before and after the procedure. Patient was prepped and draped in the usual sterile fashion. The correct patient, procedure, and site was verified. Injection Procedure Details: Procedure  diagnoses: Lumbar radiculopathy [M54.16]  Meds Administered: Meds ordered this encounter Medications  methylPREDNISolone acetate (DEPO-MEDROL) injection 80 mg Laterality: Left Location/Site: L3 Needle:5.0 in., 22 ga.  Short bevel or Quincke spinal needle Needle Placement: Transforaminal Findings:   -Comments: Excellent flow of contrast along the nerve, nerve root and into the epidural space. Procedure Details: After squaring off the end-plates to get a true AP view, the C-arm was positioned so that an oblique view of the foramen as noted above was visualized. The target area is just inferior to the "nose of the scotty dog" or sub pedicular. The soft tissues overlying this structure were infiltrated with 2-3 ml. of 1% Lidocaine without Epinephrine. The spinal needle was inserted toward the target using a "trajectory" view along the fluoroscope beam.  Under AP and lateral visualization, the needle was advanced so it did not puncture dura and was located close the 6 O'Clock position of the pedical in AP tracterory. Biplanar projections were used to confirm position. Aspiration was confirmed to be negative for CSF and/or blood. A 1-2 ml. volume of Isovue-250 was injected and flow of contrast was noted at each level. Radiographs were obtained for documentation purposes. After attaining the desired flow of contrast documented above, a 0.5 to 1.0 ml test dose of 0.25% Marcaine was injected into each respective transforaminal space.  The patient was observed for 90 seconds post injection.  After no sensory deficits were reported, and normal lower extremity motor function was noted,   the above injectate was administered so that equal amounts of the injectate were placed at each foramen (level) into the transforaminal epidural space. Additional Comments: The patient tolerated the procedure well Dressing: 2 x 2 sterile gauze and Band-Aid  Post-procedure details: Patient was observed during the procedure. Post-procedure  instructions were reviewed. Patient left the clinic in stable condition.    DG Foot Complete Right  Result Date: 01/09/2021 Please see detailed radiograph report in office note.  XR C-ARM NO REPORT  Result Date: 01/09/2021 Please see Notes tab for imaging impression.      Discharge Exam: Vitals:   01/24/21 0343 01/24/21 0757  BP: (!) 147/52 (!) 119/52  Pulse: 73 75  Resp: 17 17  Temp: 98.5 F (36.9 C) 98.1 F (36.7 C)  SpO2: 93% 97%    General: Pt is alert, awake, not in acute distress Cardiovascular: RRR, S1/S2 +, no edema Respiratory: CTA bilaterally, no wheezing, no rhonchi, no respiratory distress,  no conversational dyspnea  Abdominal: Soft, NT, ND, bowel sounds + Extremities: no edema, no cyanosis, right foot in dry and clean dressing  Psych: Normal mood and affect, stable judgement and insight     The results of significant diagnostics from this hospitalization (including imaging, microbiology, ancillary and laboratory) are listed below for reference.     Microbiology: Recent Results (from the past 240 hour(s))  Blood Cultures x 2 sites     Status: None   Collection Time: 01/16/21  4:42 PM   Specimen: BLOOD  Result Value Ref Range Status   Specimen Description BLOOD LEFT ANTECUBITAL  Final   Special Requests   Final    BOTTLES DRAWN AEROBIC ONLY Blood Culture adequate volume   Culture   Final    NO GROWTH 5 DAYS Performed at [facility], [address], [place], [place] [address]    Report Status 01/21/2021 FINAL  Final  Blood Cultures x 2 sites     Status: None   Collection Time: 01/16/21  4:42 PM   Specimen: BLOOD LEFT ARM  Result Value Ref Range Status   Specimen Description BLOOD LEFT ARM  Final   Special Requests   Final    BOTTLES DRAWN AEROBIC ONLY Blood Culture adequate volume   Culture   Final    NO GROWTH 5 DAYS Performed at [facility], [place] [address], [place], [place] [address]    Report Status 01/21/2021 FINAL  Final  Resp  Panel by RT-PCR (Flu A&B, Covid) Nasopharyngeal Swab     Status: None   Collection Time: 01/16/21  5:12 PM   Specimen: Nasopharyngeal Swab; Nasopharyngeal(NP) swabs in vial transport medium  Result Value Ref Range Status   SARS Coronavirus 2 by RT PCR NEGATIVE NEGATIVE Final    Comment: (NOTE) SARS-CoV-2 target nucleic acids are NOT DETECTED.  The SARS-CoV-2 RNA is generally detectable in upper respiratory specimens during the acute phase of infection. The lowest concentration of SARS-CoV-2 viral copies this assay can detect is 138 copies/mL. A negative result does not preclude SARS-Cov-2 infection and should not be used as the sole basis for treatment or other patient management decisions. A negative result may occur with  improper specimen collection/handling, submission of specimen other than nasopharyngeal swab, presence of viral mutation(s) within the areas targeted by this assay, and inadequate number of viral copies(<138 copies/mL). A negative result must be combined with clinical observations, patient history, and epidemiological information. The expected result is Negative.  Fact Sheet for Patients:  EntrepreneurPulse.com.au  Fact Sheet for Healthcare Providers:  IncredibleEmployment.be  This test is no t yet approved or cleared by the [place] FDA and  has been authorized for detection and/or diagnosis of SARS-CoV-2 by FDA under an Emergency Use Authorization (EUA). This EUA will remain  in effect (meaning this test can be used) for the duration of the COVID-19 declaration under Section 564(b)(1) of the Act, 21 U.S.C.section 360bbb-3(b)(1), unless the authorization is terminated  or revoked sooner.       Influenza A by PCR NEGATIVE NEGATIVE Final   Influenza B by PCR NEGATIVE NEGATIVE Final    Comment: (NOTE) The Xpert Xpress SARS-CoV-2/FLU/RSV plus assay is intended as an aid in the diagnosis of influenza from Nasopharyngeal  swab specimens and should not be used as a sole basis for treatment. Nasal washings and aspirates are unacceptable for Xpert Xpress SARS-CoV-2/FLU/RSV testing.  Fact Sheet for Patients: EntrepreneurPulse.com.au  Fact Sheet for Healthcare Providers: IncredibleEmployment.be  This  test is not yet approved or cleared by the [place] and has been authorized for detection and/or diagnosis of SARS-CoV-2 by FDA under an Emergency Use Authorization (EUA). This EUA will remain in effect (meaning this test can be used) for the duration of the COVID-19 declaration under Section 564(b)(1) of the Act, 21 U.S.C. section 360bbb-3(b)(1), unless the authorization is terminated or revoked.  Performed at [facility], [place] [address] [place], [place] [address]      Labs: BNP (last 3 results) No results for input(s): BNP in the last 8760 hours. Basic Metabolic Panel: Recent Labs  Lab 01/18/21 0132 01/19/21 0743  NA 139 140  K 4.2 4.2  CL 108 109  CO2 21* 23  GLUCOSE 183* 123*  BUN 36* 20  CREATININE 1.30* 1.11*  CALCIUM 8.1* 8.0*  MG 1.9 1.7  PHOS 3.7 3.1   Liver Function Tests: Recent Labs  Lab 01/18/21 0132 01/19/21 0743  ALBUMIN 2.4* 2.3*   No results for input(s): LIPASE, AMYLASE in the last 168 hours. No results for input(s): AMMONIA in the last 168 hours. CBC: Recent Labs  Lab 01/18/21 0132 01/19/21 0743  WBC 7.0 9.3  HGB 9.3* 9.6*  HCT 29.1* 30.3*  MCV 93.6 93.2  PLT 244 238   Cardiac Enzymes: No results for input(s): CKTOTAL, CKMB, CKMBINDEX, TROPONINI in the last 168 hours. BNP: Invalid input(s): POCBNP CBG: Recent Labs  Lab 01/23/21 0726 01/23/21 1200 01/23/21 1715 01/23/21 2026 01/24/21 0746  GLUCAP 165* 288* 137* 85 121*   D-Dimer No results for input(s): DDIMER in the last 72 hours. Hgb A1c No results for input(s): HGBA1C in the last 72 hours. Lipid Profile No results for input(s): CHOL, HDL,  LDLCALC, TRIG, CHOLHDL, LDLDIRECT in the last 72 hours. Thyroid function studies No results for input(s): TSH, T4TOTAL, T3FREE, THYROIDAB in the last 72 hours.  Invalid input(s): FREET3 Anemia work up No results for input(s): VITAMINB12, FOLATE, FERRITIN, TIBC, IRON, RETICCTPCT in the last 72 hours. Urinalysis    Component Value Date/Time   COLORURINE YELLOW 07/31/2018 0516   APPEARANCEUR HAZY (A) 07/31/2018 0516   LABSPEC 1.011 07/31/2018 0516   PHURINE 5.0 07/31/2018 0516   GLUCOSEU NEGATIVE 07/31/2018 0516   HGBUR SMALL (A) 07/31/2018 0516   BILIRUBINUR NEGATIVE 07/31/2018 0516   KETONESUR NEGATIVE 07/31/2018 0516   PROTEINUR NEGATIVE 07/31/2018 0516   NITRITE NEGATIVE 07/31/2018 0516   LEUKOCYTESUR NEGATIVE 07/31/2018 0516   Sepsis Labs Invalid input(s): PROCALCITONIN,  WBC,  LACTICIDVEN Microbiology Recent Results (from the past 240 hour(s))  Blood Cultures x 2 sites     Status: None   Collection Time: 01/16/21  4:42 PM   Specimen: BLOOD  Result Value Ref Range Status   Specimen Description BLOOD LEFT ANTECUBITAL  Final   Special Requests   Final    BOTTLES DRAWN AEROBIC ONLY Blood Culture adequate volume   Culture   Final    NO GROWTH 5 DAYS Performed at [facility], [place] [address], [place], [place] [address]    Report Status 01/21/2021 FINAL  Final  Blood Cultures x 2 sites     Status: None   Collection Time: 01/16/21  4:42 PM   Specimen: BLOOD LEFT ARM  Result Value Ref Range Status   Specimen Description BLOOD LEFT ARM  Final   Special Requests   Final    BOTTLES DRAWN AEROBIC ONLY Blood Culture adequate volume   Culture   Final    NO GROWTH  5 DAYS Performed at [facility], [place] [address], [place], [place] [address]    Report Status 01/21/2021 FINAL  Final  Resp Panel by RT-PCR (Flu A&B, Covid) Nasopharyngeal Swab     Status: None   Collection Time: 01/16/21  5:12 PM   Specimen: Nasopharyngeal Swab; Nasopharyngeal(NP) swabs in vial transport  medium  Result Value Ref Range Status   SARS Coronavirus 2 by RT PCR NEGATIVE NEGATIVE Final    Comment: (NOTE) SARS-CoV-2 target nucleic acids are NOT DETECTED.  The SARS-CoV-2 RNA is generally detectable in upper respiratory specimens during the acute phase of infection. The lowest concentration of SARS-CoV-2 viral copies this assay can detect is 138 copies/mL. A negative result does not preclude SARS-Cov-2 infection and should not be used as the sole basis for treatment or other patient management decisions. A negative result may occur with  improper specimen collection/handling, submission of specimen other than nasopharyngeal swab, presence of viral mutation(s) within the areas targeted by this assay, and inadequate number of viral copies(<138 copies/mL). A negative result must be combined with clinical observations, patient history, and epidemiological information. The expected result is Negative.  Fact Sheet for Patients:  EntrepreneurPulse.com.au  Fact Sheet for Healthcare Providers:  IncredibleEmployment.be  This test is no t yet approved or cleared by the [place] FDA and  has been authorized for detection and/or diagnosis of SARS-CoV-2 by FDA under an Emergency Use Authorization (EUA). This EUA will remain  in effect (meaning this test can be used) for the duration of the COVID-19 declaration under Section 564(b)(1) of the Act, 21 U.S.C.section 360bbb-3(b)(1), unless the authorization is terminated  or revoked sooner.       Influenza A by PCR NEGATIVE NEGATIVE Final   Influenza B by PCR NEGATIVE NEGATIVE Final    Comment: (NOTE) The Xpert Xpress SARS-CoV-2/FLU/RSV plus assay is intended as an aid in the diagnosis of influenza from Nasopharyngeal swab specimens and should not be used as a sole basis for treatment. Nasal washings and aspirates are unacceptable for Xpert Xpress SARS-CoV-2/FLU/RSV testing.  Fact Sheet for  Patients: EntrepreneurPulse.com.au  Fact Sheet for Healthcare Providers: IncredibleEmployment.be  This test is not yet approved or cleared by the [place] FDA and has been authorized for detection and/or diagnosis of SARS-CoV-2 by FDA under an Emergency Use Authorization (EUA). This EUA will remain in effect (meaning this test can be used) for the duration of the COVID-19 declaration under Section 564(b)(1) of the Act, 21 U.S.C. section 360bbb-3(b)(1), unless the authorization is terminated or revoked.  Performed at [facility], [place] [address] [place], [place] [address]      Patient was seen and examined on the day of discharge and was found to be in stable condition. Time coordinating discharge: 35 minutes including assessment and coordination of care, as well as examination of the patient.   SIGNED:  Dessa Phi, DO [facility] 01/24/2021, 11:53 AM

## 2021-01-24 NOTE — Progress Notes (Signed)
Larkin Ina (Dr. Nona Dell PA) called back to confirm that dressing needs to stay on till follow up in office as is sterile underneath and no plans to remove sutures for at least 2 weeks.

## 2021-01-24 NOTE — Progress Notes (Signed)
Inpatient Rehabilitation  Patient information reviewed and entered into eRehab system by Abri Vacca M. Nassim Cosma, M.A., CCC/SLP, PPS Coordinator.  Information including medical coding, functional ability and quality indicators will be reviewed and updated through discharge.    

## 2021-01-24 NOTE — Care Management Important Message (Signed)
Important Message  Patient Details  Name: Shelby Walker MRN: [id number] Date of Birth: [birth year]   Medicare Important Message Given:  Yes     Joetta Manners 01/24/2021, 4:16 PM

## 2021-01-24 NOTE — H&P (Signed)
Physical Medicine and Rehabilitation Admission H&P    CC: Functional deficits due to R TMA   HPI: Shelby Walker is an 87 year old female with history of T2DM, c diff colitis w/sepsis, GERD, RA, CKD, PAD, A fib- on Eliquis , diabetic foot ulcer who was admitted on 12/16/20 with gangrenous changes with necrosis and purulent drainage. She was started on broad spectrum antibiotics and underwent Left transmetatarsal amputation on 11/16 by Dr. Doran Durand. Post op to be WBAT in CAM boot and has had issues with pain control requiring IV morphine. Blood sugars have been labile but improving and ABLA being monitored. Therapy ongoing and patient noted to have limitations in mobility and ability to carry out ADLs. CIR recommended due to functional decline.    Pt reports that pain has been an issue, but describes more of a nerve and phantom pain, more than regular post op pain- she describes burning, stinging, stabbing and shooting pain from "tiny toe (that's not there) to mid calf".  Also c/o L anterior shoulder pain.- very painful, even with voltaren gel and lidoderm patch.  Also that her backside/buttocks are painful from sitting in chair.   LBM yesterday- when eats the wrong foods, has bowel accidents; peeing ok; no C Diff recurrence since 2020.   Review of Systems  Constitutional: Negative.  Negative for chills and fever.  HENT:  Positive for hearing loss.   Eyes:  Positive for discharge.  Respiratory:  Negative for shortness of breath.   Cardiovascular:  Negative for chest pain.  Gastrointestinal:  Negative for constipation and heartburn.  Genitourinary:  Negative for dysuria.  Musculoskeletal:  Positive for joint pain (right knee pain which is new. Left shoulder pain/weakness due to injury 3 weeks ago.).  Skin:  Negative for rash.  Neurological:  Positive for weakness. Negative for dizziness and headaches.  Psychiatric/Behavioral:  The patient is not nervous/anxious.     Past Medical History:   Diagnosis Date   Bruit    Carotid Doppler showed no significant abnormality     9per patient)   C. difficile colitis 07/2018   with severe sepsis   Chest pain, unspecified    Nuclear, May, 2008, no scar or ischemia   Diabetes mellitus    Diverticulosis    Dyslipidemia    Ejection fraction    EF 55-60%, echo, February, 2011 // Echocardiogram 8/21: EF 60-65, no RWMA, Gr 1 DD, GLS -14%, normal RVSF, mild LAE, trivial MR, mild MS (mean 4 mmHg), RVSP 23.4    GERD (gastroesophageal reflux disease)    Mitral regurgitation April 21, 2009   mild,  echo, February, 2011   Osteoporosis    Palpitations    possible very brief atrial fibrillation on monitor and possible reentrant tachycardia   Psoriasis    Rheumatoid arthritis [facility])     Past Surgical History:  Procedure Laterality Date   ABDOMINAL AORTOGRAM W/LOWER EXTREMITY Right 10/19/2020   Procedure: ABDOMINAL AORTOGRAM W/LOWER EXTREMITY;  Surgeon: Wellington Hampshire, MD;  Location: [facility];  Service: Cardiovascular;  Laterality: Right;   FRACTURE SURGERY     TRANSMETATARSAL AMPUTATION Right 01/18/2021   Procedure: TRANSMETATARSAL AMPUTATION;  Surgeon: Wylene Simmer, MD;  Location: [place];  Service: Orthopedics;  Laterality: Right;   Family History  Problem Relation Age of Onset   Heart failure Father    Heart attack Father    Diabetes Father    Heart attack Mother    Heart failure Mother  Diabetes Mother    Stroke Sister    Social History: Lives alone and independent PTA.  Family provides meals and  helps with home management. She  reports that she has never smoked. She has never used smokeless tobacco. She reports that she does not drink alcohol and does not use drugs.   Allergies  Allergen Reactions   Cholestyramine     Possible- Makes throat burn. Patient said she can't take it    Compazine [Prochlorperazine Edisylate] Swelling    REACTION: " tongue swell and unable to swallow"   Sulfonamide Derivatives Other  (See Comments)    REACTION: " broke out with fine itching bumps"   Hydrocodone Other (See Comments)   Infliximab     Other reaction(s): rash   Prochlorperazine     Other reaction(s): Unknown   Rosuvastatin     Other reaction(s): muscle aches   Sulfa Antibiotics     Other reaction(s): tongue swelling    Medications Prior to Admission  Medication Sig Dispense Refill   acetaminophen (TYLENOL) 325 MG tablet Take 2 tablets (650 mg total) by mouth every 6 (six) hours as needed for mild pain (or Fever >/= 101).     acetaminophen-codeine (TYLENOL #3) 300-30 MG tablet TAKE 1 TO 2 TABLETS BY MOUTH EVERY 8 HOURS AS NEEDED (Patient taking differently: Take 1 tablet by mouth every 8 (eight) hours as needed for severe pain.) 30 tablet 0   apixaban (ELIQUIS) 2.5 MG TABS tablet Take 1 tablet by mouth 2 times daily (Patient taking differently: Take 2.5 mg by mouth 2 (two) times daily.) 60 tablet 1   atorvastatin (LIPITOR) 20 MG tablet Take 1 tablet by mouth once daily (Patient taking differently: Take 20 mg by mouth at bedtime.) 90 tablet 1   Calcium Carbonate-Vitamin D 600-400 MG-UNIT tablet Take 1 tablet by mouth daily at 12 noon.      carvedilol (COREG) 3.125 MG tablet TAKE 1 TABLET BY MOUTH TWICE DAILY WITH MEALS (Patient taking differently: Take 3.125 mg by mouth 2 (two) times daily with a meal.) 180 tablet 3   Cholecalciferol 25 MCG (1000 UT) capsule Take 1 capsule (1,000 Units total) by mouth daily. 30 capsule 0   doxycycline (VIBRAMYCIN) 100 MG capsule Take 1 capsule (100 mg total) by mouth 2 (two) times daily. (Patient taking differently: Take 100 mg by mouth 2 (two) times daily. Start date: 01/14/21) 20 capsule 0   esomeprazole (NEXIUM) 20 MG capsule Take 20 mg by mouth in the morning.     gabapentin (NEURONTIN) 100 MG capsule Take 1 capsule (100 mg total) by mouth 3 (three) times daily as needed. (Patient taking differently: Take 100 mg by mouth 3 (three) times daily as needed (pain).) 60 capsule 1    insulin aspart (NOVOLOG) 100 UNIT/ML FlexPen Inject 6 Units into the skin 3 (three) times daily with meals. Sliding scale     Insulin Degludec (TRESIBA [place]) Inject 6-8 Units into the skin at bedtime. Sliding scale     Multiple Vitamins-Minerals (CENTRUM SILVER 50+WOMEN PO) Take 1 tablet by mouth daily.     nitroGLYCERIN (NITROSTAT) 0.4 MG SL tablet Place 1 tablet (0.4 mg total) under the tongue every 5 (five) minutes as needed for chest pain. 25 tablet 6   Nutritional Supplements (ENSURE ACTIVE) LIQD Take 237 mLs by mouth in the morning. Prefers Chocolate     predniSONE (DELTASONE) 1 MG tablet Take 4 mg by mouth in the morning.     pyrithione zinc (SELSUN BLUE  DRY SCALP) 1 % shampoo Apply 1 application topically once a week.     RESTASIS 0.05 % ophthalmic emulsion Place 1 drop into both eyes 2 (two) times daily.     traMADol (ULTRAM) 50 MG tablet Take 50 mg by mouth every 6 (six) hours as needed for moderate pain.      Drug Regimen Review  Drug regimen was reviewed and remains appropriate with no significant issues identified  Home: Home Living Family/patient expects to be discharged to:: Private residence Living Arrangements: Alone Available Help at Discharge: Family, Available 24 hours/day Type of Home: House Home Access: Stairs to enter [employer] of Steps: 1 Entrance Stairs-Rails: Left Home Layout: Able to live on main level with bedroom/bathroom, Laundry or work area in basement [employer] Shower/Tub: Holiday representative Toilet: Handicapped height Bathroom Accessibility: Yes (pt thinks a walker may be able to fit in bathroom. She said she has never tried to take one into bathroom.) Home Equipment: Conservation officer, nature (2 wheels), Shower seat Additional Comments: used cane intemittently, has used RW more recently due to foot pain   Functional History: Prior Function Prior Level of Function : Needs assist Mobility Comments: pt reports using RW or no AD for gait, reports  no history of falls in the past year ADLs Comments: family helps with larger meals, and cleaning  Functional Status:  Mobility: Bed Mobility Overal bed mobility: Needs Assistance Bed Mobility: Sit to Supine Supine to sit: Supervision, HOB elevated Sit to supine: Min guard General bed mobility comments: assist for RLE lift into bed, boost up in bed with bed pads. Transfers Overall transfer level: Needs assistance Equipment used: Rolling walker (2 wheels) Transfers: Sit to/from Stand, Bed to chair/wheelchair/BSC Sit to Stand: Min assist Bed to/from chair/wheelchair/BSC transfer type:: Step pivot Stand pivot transfers: Mod assist Step pivot transfers: Mod assist, Max assist General transfer comment: initial transfer more difficult then instructed pt on use of cam boot with better control of balance Ambulation/Gait Ambulation/Gait assistance: Min assist, +2 physical assistance, +2 safety/equipment, Mod assist Gait Distance (Feet): 28 Feet (in three trips) Assistive device: Rolling walker (2 wheels), 1 person hand held assist, Pushed wheelchair Gait Pattern/deviations: Step-through pattern, Step-to pattern, Decreased stride length, Decreased weight shift to right General Gait Details: donned cam boot with modified fit to pad top of foot and brought straps across the top and closed velcro without d ring Gait velocity: reduced Pre-gait activities: wgt shift in cam boot    ADL: ADL Overall ADL's : Needs assistance/impaired Eating/Feeding: Set up, Sitting Grooming: Brushing hair, Sitting Grooming Details (indicate cue type and reason): performed seated grooming task at sink Upper Body Bathing: Minimal assistance, Sitting Lower Body Bathing: Minimal assistance, Sitting/lateral leans Upper Body Dressing : Supervision/safety, Sitting Upper Body Dressing Details (indicate cue type and reason): donned gown seated in chair, able to stand to pull gown down while holding on to RW with 1  hand Lower Body Dressing: Maximal assistance, Sitting/lateral leans Lower Body Dressing Details (indicate cue type and reason): max A to don CAM boot sitting in chair Toilet Transfer: Minimal assistance, Ambulation, Rolling walker (2 wheels) Toileting- Clothing Manipulation and Hygiene: Moderate assistance, Sitting/lateral lean Functional mobility during ADLs: Minimal assistance, Moderate assistance General ADL Comments: Pt reported that she had just got back in bed after sponge bathing while sitting in chair. Pt reported 10/10 pain during activity and declined any OOB/EOB activity with OT as her pain had "just settled down." States that she was able to participate in bathing,  but needed assistance due to pain and limited use of LUE.  Cognition: Cognition Overall Cognitive Status: Within Functional Limits for tasks assessed Orientation Level: Oriented X4 Cognition Arousal/Alertness: Awake/alert Behavior During Therapy: WFL for tasks assessed/performed Overall Cognitive Status: Within Functional Limits for tasks assessed Area of Impairment: Safety/judgement, Awareness, Problem solving Safety/Judgement: Decreased awareness of safety General Comments: requires safety cues throughout mobility   Blood pressure (!) 119/52, pulse 75, temperature 98.1 F (36.7 C), temperature source Oral, resp. rate 17, height 5\' 1"  (1.549 m), weight 54.5 kg, SpO2 97 %. Physical Exam Vitals and nursing note reviewed. Exam conducted with a chaperone present.  Constitutional:      Comments: Pt is frail, elderly female sitting in bedside chair, daughter at bedside; very HOH, NAD  HENT:     Head: Normocephalic and atraumatic.     Comments: Smile equal    Right Ear: External ear normal.     Left Ear: External ear normal.     Nose: Nose normal. No congestion.     Mouth/Throat:     Mouth: Mucous membranes are dry.     Pharynx: Oropharynx is clear. No oropharyngeal exudate.  Eyes:     General:        Right eye:  No discharge.        Left eye: No discharge.  Cardiovascular:     Rate and Rhythm: Normal rate and regular rhythm.     Heart sounds: Normal heart sounds. No murmur heard.   No gallop.     Comments: Not in afib currently.  Pulmonary:     Comments: CTA B/L- no W/R/R- good air movement  Abdominal:     Comments: Soft, NT, ND, (+)BS; normoactive  Musculoskeletal:     Cervical back: Normal range of motion. No rigidity.     Comments: UE strength 5-/5- however has severe RA chronic changes/ulnar deviation in hands B/L with B/L Hand atrophy  RLE- HF/KE/KF 5-/5; NT DF/PF due to CAM boot/pain and TMA LLE- 5-/5  R TMA dressed in ACE wrap; with CAM boot- mild ot moderate swelling- per surgeon to not take off surgical dressing til 2+ weeks when they will remove staples.   Skin:    Comments: Ecchymoses in UE B/L and LLE notable IV L forearm- looks OK No skin changes on backside seen- blanchable redness; bony coccyx L heel slightly boggy- no skin breakdown  Neurological:     Comments: Intact to light touch in UE B/L  LLE- intact to L knee and RLE intact to knee- decreased below knee B/L   Psychiatric:        Behavior: Behavior normal.     Comments: HOH    Results for orders placed or performed during the hospital encounter of 01/16/21 (from the past 48 hour(s))  Glucose, capillary     Status: Abnormal   Collection Time: 01/22/21 11:18 AM  Result Value Ref Range   Glucose-Capillary 148 (H) 70 - 99 mg/dL    Comment: Glucose reference range applies only to samples taken after fasting for at least 8 hours.  Glucose, capillary     Status: Abnormal   Collection Time: 01/22/21  1:15 PM  Result Value Ref Range   Glucose-Capillary 108 (H) 70 - 99 mg/dL    Comment: Glucose reference range applies only to samples taken after fasting for at least 8 hours.  Glucose, capillary     Status: Abnormal   Collection Time: 01/22/21  4:04 PM  Result Value Ref Range  Glucose-Capillary 156 (H) 70 - 99 mg/dL     Comment: Glucose reference range applies only to samples taken after fasting for at least 8 hours.  Glucose, capillary     Status: Abnormal   Collection Time: 01/22/21  8:59 PM  Result Value Ref Range   Glucose-Capillary 148 (H) 70 - 99 mg/dL    Comment: Glucose reference range applies only to samples taken after fasting for at least 8 hours.  Glucose, capillary     Status: Abnormal   Collection Time: 01/23/21  7:26 AM  Result Value Ref Range   Glucose-Capillary 165 (H) 70 - 99 mg/dL    Comment: Glucose reference range applies only to samples taken after fasting for at least 8 hours.  Glucose, capillary     Status: Abnormal   Collection Time: 01/23/21 12:00 PM  Result Value Ref Range   Glucose-Capillary 288 (H) 70 - 99 mg/dL    Comment: Glucose reference range applies only to samples taken after fasting for at least 8 hours.  Glucose, capillary     Status: Abnormal   Collection Time: 01/23/21  5:15 PM  Result Value Ref Range   Glucose-Capillary 137 (H) 70 - 99 mg/dL    Comment: Glucose reference range applies only to samples taken after fasting for at least 8 hours.  Glucose, capillary     Status: None   Collection Time: 01/23/21  8:26 PM  Result Value Ref Range   Glucose-Capillary 85 70 - 99 mg/dL    Comment: Glucose reference range applies only to samples taken after fasting for at least 8 hours.  Glucose, capillary     Status: Abnormal   Collection Time: 01/24/21  7:46 AM  Result Value Ref Range   Glucose-Capillary 121 (H) 70 - 99 mg/dL    Comment: Glucose reference range applies only to samples taken after fasting for at least 8 hours.   No results found.     Medical Problem List and Plan: 1. Functional deficits secondary to R TMA due to necrosis/Diabetic foot ulcer on R foot  -patient may  shower if covers RLE/CAM boot  -ELOS/Goals:  7-10 days supervision 2.  Antithrombotics: -DVT/anticoagulation:  Pharmaceutical: Other (comment)--on Eliquis  -antiplatelet therapy:  N/a 3. Pain Management: Using tramadol qid prn with occasional oxycodone prn. Also has voltaren gel and lidoderm for L shoulder pain  4. Mood: LCSW to follow for evaluation and support.   -antipsychotic agents: N/A 5. Neuropsych: This patient is capable of making decisions on her own behalf. 6. Skin/Wound Care: Monitor wound for healing. Has low protein stores--alb-2.4  --add Juven to supplement low calorie malnutrition  --contacted Dr. Doran Durand re: dressing change/orders. Was informed dressing needs to stay on till follow up in office. Per Ortho surgeon 7. Fluids/Electrolytes/Nutrition: Monitor I/O. Check CMET in am.  8. A fib: Monitor HR TID-- on Coreg BID w/Eliquis 9. T2DM: Hgb A1C-  continue insulin Glargine with SSI.  10. Acute blood loss anemia: Recheck CBC in am.  11. Chemo/Diabetes induced peripheral neuropathy: Managed with Neurontin  12. RA: On prednisone with monthly infusion of DMARD.  13. Right knee pain: Will add Voltaren gel qid to knee. 14. Left bicipital tendinitis: Continue lidocaine patch to shoulder. If pain not better by Friday, will do steroid injection as long as BG's controlled   I have personally performed a face to face diagnostic evaluation of this patient and formulated the key components of the plan.  Additionally, I have personally reviewed laboratory data, imaging studies,  as well as relevant notes and concur with the physician assistant's documentation above.   The patient's status has not changed from the original H&P.  Any changes in documentation from the acute care chart have been noted above.     Bary Leriche, PA-C 01/24/2021

## 2021-01-24 NOTE — Progress Notes (Signed)
Inpatient Rehab Admissions Coordinator:   I have a bed for this Pt. On CIR. RN may call report to [phone] after 12pm.  Clemens Catholic, [place], [place] Admissions Coordinator  [phone] (celll) [phone] (office)

## 2021-01-24 NOTE — H&P (Signed)
Physical Medicine and Rehabilitation Admission H&P     CC: Functional deficits due to R TMA     HPI: Shelby Walker is an 87 year old female with history of T2DM, c diff colitis w/sepsis, GERD, RA, CKD, PAD, A fib- on Eliquis , diabetic foot ulcer who was admitted on 12/16/20 with gangrenous changes with necrosis and purulent drainage. She was started on broad spectrum antibiotics and underwent Left transmetatarsal amputation on 11/16 by Dr. Doran Durand. Post op to be WBAT in CAM boot and has had issues with pain control requiring IV morphine. Blood sugars have been labile but improving and ABLA being monitored. Therapy ongoing and patient noted to have limitations in mobility and ability to carry out ADLs. CIR recommended due to functional decline.      Pt reports that pain has been an issue, but describes more of a nerve and phantom pain, more than regular post op pain- she describes burning, stinging, stabbing and shooting pain from "tiny toe (that's not there) to mid calf".  Also c/o L anterior shoulder pain.- very painful, even with voltaren gel and lidoderm patch.  Also that her backside/buttocks are painful from sitting in chair.    LBM yesterday- when eats the wrong foods, has bowel accidents; peeing ok; no C Diff recurrence since 2020.    Review of Systems  Constitutional: Negative.  Negative for chills and fever.  HENT:  Positive for hearing loss.   Eyes:  Positive for discharge.  Respiratory:  Negative for shortness of breath.   Cardiovascular:  Negative for chest pain.  Gastrointestinal:  Negative for constipation and heartburn.  Genitourinary:  Negative for dysuria.  Musculoskeletal:  Positive for joint pain (right knee pain which is new. Left shoulder pain/weakness due to injury 3 weeks ago.).  Skin:  Negative for rash.  Neurological:  Positive for weakness. Negative for dizziness and headaches.  Psychiatric/Behavioral:  The patient is not nervous/anxious.           Past  Medical History:  Diagnosis Date   Bruit      Carotid Doppler showed no significant abnormality     9per patient)   C. difficile colitis 07/2018    with severe sepsis   Chest pain, unspecified      Nuclear, May, 2008, no scar or ischemia   Diabetes mellitus     Diverticulosis     Dyslipidemia     Ejection fraction      EF 55-60%, echo, February, 2011 // Echocardiogram 8/21: EF 60-65, no RWMA, Gr 1 DD, GLS -14%, normal RVSF, mild LAE, trivial MR, mild MS (mean 4 mmHg), RVSP 23.4    GERD (gastroesophageal reflux disease)     Mitral regurgitation April 21, 2009    mild,  echo, February, 2011   Osteoporosis     Palpitations      possible very brief atrial fibrillation on monitor and possible reentrant tachycardia   Psoriasis     Rheumatoid arthritis [facility])             Past Surgical History:  Procedure Laterality Date   ABDOMINAL AORTOGRAM W/LOWER EXTREMITY Right 10/19/2020    Procedure: ABDOMINAL AORTOGRAM W/LOWER EXTREMITY;  Surgeon: Wellington Hampshire, MD;  Location: [place] [facility];  Service: Cardiovascular;  Laterality: Right;   FRACTURE SURGERY       TRANSMETATARSAL AMPUTATION Right 01/18/2021    Procedure: TRANSMETATARSAL AMPUTATION;  Surgeon: Wylene Simmer, MD;  Location: [place];  Service: Orthopedics;  Laterality: Right;  Family History  Problem Relation Age of Onset   Heart failure Father     Heart attack Father     Diabetes Father     Heart attack Mother     Heart failure Mother     Diabetes Mother     Stroke Sister      Social History: Lives alone and independent PTA.  Family provides meals and  helps with home management. She  reports that she has never smoked. She has never used smokeless tobacco. She reports that she does not drink alcohol and does not use drugs.          Allergies  Allergen Reactions   Cholestyramine        Possible- Makes throat burn. Patient said she can't take it    Compazine [Prochlorperazine Edisylate] Swelling       REACTION: " tongue swell and unable to swallow"   Sulfonamide Derivatives Other (See Comments)      REACTION: " broke out with fine itching bumps"   Hydrocodone Other (See Comments)   Infliximab        Other reaction(s): rash   Prochlorperazine        Other reaction(s): Unknown   Rosuvastatin        Other reaction(s): muscle aches   Sulfa Antibiotics        Other reaction(s): tongue swelling            Medications Prior to Admission  Medication Sig Dispense Refill   acetaminophen (TYLENOL) 325 MG tablet Take 2 tablets (650 mg total) by mouth every 6 (six) hours as needed for mild pain (or Fever >/= 101).       acetaminophen-codeine (TYLENOL #3) 300-30 MG tablet TAKE 1 TO 2 TABLETS BY MOUTH EVERY 8 HOURS AS NEEDED (Patient taking differently: Take 1 tablet by mouth every 8 (eight) hours as needed for severe pain.) 30 tablet 0   apixaban (ELIQUIS) 2.5 MG TABS tablet Take 1 tablet by mouth 2 times daily (Patient taking differently: Take 2.5 mg by mouth 2 (two) times daily.) 60 tablet 1   atorvastatin (LIPITOR) 20 MG tablet Take 1 tablet by mouth once daily (Patient taking differently: Take 20 mg by mouth at bedtime.) 90 tablet 1   Calcium Carbonate-Vitamin D 600-400 MG-UNIT tablet Take 1 tablet by mouth daily at 12 noon.        carvedilol (COREG) 3.125 MG tablet TAKE 1 TABLET BY MOUTH TWICE DAILY WITH MEALS (Patient taking differently: Take 3.125 mg by mouth 2 (two) times daily with a meal.) 180 tablet 3   Cholecalciferol 25 MCG (1000 UT) capsule Take 1 capsule (1,000 Units total) by mouth daily. 30 capsule 0   doxycycline (VIBRAMYCIN) 100 MG capsule Take 1 capsule (100 mg total) by mouth 2 (two) times daily. (Patient taking differently: Take 100 mg by mouth 2 (two) times daily. Start date: 01/14/21) 20 capsule 0   esomeprazole (NEXIUM) 20 MG capsule Take 20 mg by mouth in the morning.       gabapentin (NEURONTIN) 100 MG capsule Take 1 capsule (100 mg total) by mouth 3 (three) times daily as  needed. (Patient taking differently: Take 100 mg by mouth 3 (three) times daily as needed (pain).) 60 capsule 1   insulin aspart (NOVOLOG) 100 UNIT/ML FlexPen Inject 6 Units into the skin 3 (three) times daily with meals. Sliding scale       Insulin Degludec (TRESIBA [place]) Inject 6-8 Units into the skin at bedtime. Sliding scale  Multiple Vitamins-Minerals (CENTRUM SILVER 50+WOMEN PO) Take 1 tablet by mouth daily.       nitroGLYCERIN (NITROSTAT) 0.4 MG SL tablet Place 1 tablet (0.4 mg total) under the tongue every 5 (five) minutes as needed for chest pain. 25 tablet 6   Nutritional Supplements (ENSURE ACTIVE) LIQD Take 237 mLs by mouth in the morning. Prefers Chocolate       predniSONE (DELTASONE) 1 MG tablet Take 4 mg by mouth in the morning.       pyrithione zinc (SELSUN BLUE DRY SCALP) 1 % shampoo Apply 1 application topically once a week.       RESTASIS 0.05 % ophthalmic emulsion Place 1 drop into both eyes 2 (two) times daily.       traMADol (ULTRAM) 50 MG tablet Take 50 mg by mouth every 6 (six) hours as needed for moderate pain.          Drug Regimen Review  Drug regimen was reviewed and remains appropriate with no significant issues identified   Home: Home Living Family/patient expects to be discharged to:: Private residence Living Arrangements: Alone Available Help at Discharge: Family, Available 24 hours/day Type of Home: House Home Access: Stairs to enter [employer] of Steps: 1 Entrance Stairs-Rails: Left Home Layout: Able to live on main level with bedroom/bathroom, Laundry or work area in basement [employer] Shower/Tub: Holiday representative Toilet: Handicapped height Bathroom Accessibility: Yes (pt thinks a walker may be able to fit in bathroom. She said she has never tried to take one into bathroom.) Home Equipment: Conservation officer, nature (2 wheels), Shower seat Additional Comments: used cane intemittently, has used RW more recently due to foot pain   Functional  History: Prior Function Prior Level of Function : Needs assist Mobility Comments: pt reports using RW or no AD for gait, reports no history of falls in the past year ADLs Comments: family helps with larger meals, and cleaning   Functional Status:  Mobility: Bed Mobility Overal bed mobility: Needs Assistance Bed Mobility: Sit to Supine Supine to sit: Supervision, HOB elevated Sit to supine: Min guard General bed mobility comments: assist for RLE lift into bed, boost up in bed with bed pads. Transfers Overall transfer level: Needs assistance Equipment used: Rolling walker (2 wheels) Transfers: Sit to/from Stand, Bed to chair/wheelchair/BSC Sit to Stand: Min assist Bed to/from chair/wheelchair/BSC transfer type:: Step pivot Stand pivot transfers: Mod assist Step pivot transfers: Mod assist, Max assist General transfer comment: initial transfer more difficult then instructed pt on use of cam boot with better control of balance Ambulation/Gait Ambulation/Gait assistance: Min assist, +2 physical assistance, +2 safety/equipment, Mod assist Gait Distance (Feet): 28 Feet (in three trips) Assistive device: Rolling walker (2 wheels), 1 person hand held assist, Pushed wheelchair Gait Pattern/deviations: Step-through pattern, Step-to pattern, Decreased stride length, Decreased weight shift to right General Gait Details: donned cam boot with modified fit to pad top of foot and brought straps across the top and closed velcro without d ring Gait velocity: reduced Pre-gait activities: wgt shift in cam boot   ADL: ADL Overall ADL's : Needs assistance/impaired Eating/Feeding: Set up, Sitting Grooming: Brushing hair, Sitting Grooming Details (indicate cue type and reason): performed seated grooming task at sink Upper Body Bathing: Minimal assistance, Sitting Lower Body Bathing: Minimal assistance, Sitting/lateral leans Upper Body Dressing : Supervision/safety, Sitting Upper Body Dressing Details  (indicate cue type and reason): donned gown seated in chair, able to stand to pull gown down while holding on to RW with 1 hand  Lower Body Dressing: Maximal assistance, Sitting/lateral leans Lower Body Dressing Details (indicate cue type and reason): max A to don CAM boot sitting in chair Toilet Transfer: Minimal assistance, Ambulation, Rolling walker (2 wheels) Toileting- Clothing Manipulation and Hygiene: Moderate assistance, Sitting/lateral lean Functional mobility during ADLs: Minimal assistance, Moderate assistance General ADL Comments: Pt reported that she had just got back in bed after sponge bathing while sitting in chair. Pt reported 10/10 pain during activity and declined any OOB/EOB activity with OT as her pain had "just settled down." States that she was able to participate in bathing, but needed assistance due to pain and limited use of LUE.   Cognition: Cognition Overall Cognitive Status: Within Functional Limits for tasks assessed Orientation Level: Oriented X4 Cognition Arousal/Alertness: Awake/alert Behavior During Therapy: WFL for tasks assessed/performed Overall Cognitive Status: Within Functional Limits for tasks assessed Area of Impairment: Safety/judgement, Awareness, Problem solving Safety/Judgement: Decreased awareness of safety General Comments: requires safety cues throughout mobility     Blood pressure (!) 119/52, pulse 75, temperature 98.1 F (36.7 C), temperature source Oral, resp. rate 17, height 5\' 1"  (1.549 m), weight 54.5 kg, SpO2 97 %. Physical Exam Vitals and nursing note reviewed. Exam conducted with a chaperone present.  Constitutional:      Comments: Pt is frail, elderly female sitting in bedside chair, daughter at bedside; very HOH, NAD  HENT:     Head: Normocephalic and atraumatic.     Comments: Smile equal    Right Ear: External ear normal.     Left Ear: External ear normal.     Nose: Nose normal. No congestion.     Mouth/Throat:     Mouth:  Mucous membranes are dry.     Pharynx: Oropharynx is clear. No oropharyngeal exudate.  Eyes:     General:        Right eye: No discharge.        Left eye: No discharge.  Cardiovascular:     Rate and Rhythm: Normal rate and regular rhythm.     Heart sounds: Normal heart sounds. No murmur heard.   No gallop.     Comments: Not in afib currently.  Pulmonary:     Comments: CTA B/L- no W/R/R- good air movement   Abdominal:     Comments: Soft, NT, ND, (+)BS; normoactive  Musculoskeletal:     Cervical back: Normal range of motion. No rigidity.     Comments: UE strength 5-/5- however has severe RA chronic changes/ulnar deviation in hands B/L with B/L Hand atrophy  RLE- HF/KE/KF 5-/5; NT DF/PF due to CAM boot/pain and TMA LLE- 5-/5  R TMA dressed in ACE wrap; with CAM boot- mild ot moderate swelling- per surgeon to not take off surgical dressing til 2+ weeks when they will remove staples.   Skin:    Comments: Ecchymoses in UE B/L and LLE notable IV L forearm- looks OK No skin changes on backside seen- blanchable redness; bony coccyx L heel slightly boggy- no skin breakdown  Neurological:     Comments: Intact to light touch in UE B/L  LLE- intact to L knee and RLE intact to knee- decreased below knee B/L   Psychiatric:        Behavior: Behavior normal.     Comments: HOH      Lab Results Last 48 Hours        Results for orders placed or performed during the hospital encounter of 01/16/21 (from the past 48 hour(s))  Glucose, capillary  Status: Abnormal    Collection Time: 01/22/21 11:18 AM  Result Value Ref Range    Glucose-Capillary 148 (H) 70 - 99 mg/dL      Comment: Glucose reference range applies only to samples taken after fasting for at least 8 hours.  Glucose, capillary     Status: Abnormal    Collection Time: 01/22/21  1:15 PM  Result Value Ref Range    Glucose-Capillary 108 (H) 70 - 99 mg/dL      Comment: Glucose reference range applies only to samples taken after  fasting for at least 8 hours.  Glucose, capillary     Status: Abnormal    Collection Time: 01/22/21  4:04 PM  Result Value Ref Range    Glucose-Capillary 156 (H) 70 - 99 mg/dL      Comment: Glucose reference range applies only to samples taken after fasting for at least 8 hours.  Glucose, capillary     Status: Abnormal    Collection Time: 01/22/21  8:59 PM  Result Value Ref Range    Glucose-Capillary 148 (H) 70 - 99 mg/dL      Comment: Glucose reference range applies only to samples taken after fasting for at least 8 hours.  Glucose, capillary     Status: Abnormal    Collection Time: 01/23/21  7:26 AM  Result Value Ref Range    Glucose-Capillary 165 (H) 70 - 99 mg/dL      Comment: Glucose reference range applies only to samples taken after fasting for at least 8 hours.  Glucose, capillary     Status: Abnormal    Collection Time: 01/23/21 12:00 PM  Result Value Ref Range    Glucose-Capillary 288 (H) 70 - 99 mg/dL      Comment: Glucose reference range applies only to samples taken after fasting for at least 8 hours.  Glucose, capillary     Status: Abnormal    Collection Time: 01/23/21  5:15 PM  Result Value Ref Range    Glucose-Capillary 137 (H) 70 - 99 mg/dL      Comment: Glucose reference range applies only to samples taken after fasting for at least 8 hours.  Glucose, capillary     Status: None    Collection Time: 01/23/21  8:26 PM  Result Value Ref Range    Glucose-Capillary 85 70 - 99 mg/dL      Comment: Glucose reference range applies only to samples taken after fasting for at least 8 hours.  Glucose, capillary     Status: Abnormal    Collection Time: 01/24/21  7:46 AM  Result Value Ref Range    Glucose-Capillary 121 (H) 70 - 99 mg/dL      Comment: Glucose reference range applies only to samples taken after fasting for at least 8 hours.      Imaging Results (Last 48 hours)  No results found.           Medical Problem List and Plan: 1. 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  Antithrombotics: -DVT/anticoagulation:  Pharmaceutical: Other (comment)--on Eliquis             -antiplatelet therapy: N/a 3. Pain Management: Using tramadol qid prn with occasional oxycodone prn. Also has voltaren gel and lidoderm  for L shoulder pain             4. Mood: LCSW to follow for evaluation and support.              -antipsychotic agents: N/A 5. Neuropsych: This patient is capable of making decisions on her own behalf. 6. Skin/Wound Care: Monitor wound for healing. Has low protein stores--alb-2.4             --add Juven to supplement low calorie malnutrition             --contacted Dr. Doran Durand re: dressing change/orders. Was informed dressing needs to stay on till follow up in office. Per Ortho surgeon 7. Fluids/Electrolytes/Nutrition: Monitor I/O. Check CMET in am.  8. A fib: Monitor HR TID-- on Coreg BID w/Eliquis 9. T2DM: Hgb A1C-  continue insulin Glargine with SSI.  10. Acute blood loss anemia: Recheck CBC in am.  11. Chemo/Diabetes induced peripheral neuropathy: Managed with Neurontin  12. RA: On prednisone with monthly infusion of DMARD.  13. Right knee pain: Will add Voltaren gel qid to knee. 14. Left bicipital tendinitis: Continue lidocaine patch to shoulder. If pain not better by Friday, will do steroid injection as long as BG's controlled     I have personally performed a face to face diagnostic evaluation of this patient and formulated the key components of the plan.  Additionally, I have personally reviewed laboratory data, imaging studies, as well as relevant notes and concur with the physician assistant's documentation above.   The patient's status has not changed from the original H&P.  Any changes in documentation from the acute care chart have been noted above.       Bary Leriche, PA-C 01/24/2021

## 2021-01-24 NOTE — Progress Notes (Signed)
PMR Admission Coordinator Pre-Admission Assessment   Patient: Shelby Walker is an 87 y.o., female MRN: [id number] DOB: [birth year] Height: $RemoveBeforeDE'5\' 1"'[id number]$  (154.9 cm) Weight: 54.5 kg   Insurance Information HMO:     PPO:      PCP:      IPA:      80/20: yes     OTHER:  PRIMARY: Medicare A & B      Policy#: [id number]      Subscriber: patient CM Name:       Phone#:      Fax#:  Pre-Cert#:       Employer:  Benefits:  Phone #: Verified eligibility via [place] on 01/22/21     Name:  Eff. Date: Part A & B effective 06/04/99     Deduct: $1,556      Out of Pocket Max: NA      Life Max: NA CIR: 100% coverage      SNF: 100% days 1-20, 80% days 21-100 Outpatient: 80%     Co-Pay: 20% Home Health: 100%      Co-Pay:  DME: 80%     Co-Pay: 20% Providers: pt's [facility]       Policy#: [id number]     Phone#: [phone]   Financial Counselor:       Phone#:    The "Data Collection Information Summary" for patients in Inpatient Rehabilitation Facilities with attached "Privacy Act [place] Records" was provided and verbally reviewed with: Patient   Emergency Contact Information Contact Information       Name Relation Home Work Mobile    [name] Daughter     [phone]    [name], [name]     [phone]           Current Medical History  Patient Admitting Diagnosis: diabetic foot infection s/p right transmetatarsal amputation History of Present Illness: Pt is an 87 year old female with medical hx significant for: C.diff, DM II, RA, CKD, A-fib, GERD, osteoporosis. Pt presented to the hospital with a diabetic foot ulcer. Pt had had issues with her right foot for months. Previously she had an ulcer on the bottom of her foot; took antibiotics and it healed. An aortogram showed adequate circulation for wound healing. Pt last seen on 11/7 by Dr. Amalia Hailey for right foot edema/erythema and was thought to have gout; given meloxicam. Pt noted a dorsal foot wound that spontaneously  appeared on 11/12 that was apparently necrotic. Pt went to ER, given Doxy and referred to Dr. Doran Durand. Pt saw Dr. Doran Durand on 11/14 and was directly admitted to hospital for probable right TMA. PT had right TMA on 01/18/21. Therapy evaluations completed and CIR recommended d/t pt's deficits in functional mobility and inability to perform ADLs independently.    Patient's medical record from [facility] has been reviewed by the rehabilitation admission coordinator and physician.   Past Medical History      Past Medical History:  Diagnosis Date   Bruit      Carotid Doppler showed no significant abnormality     9per patient)   C. difficile colitis 07/2018    with severe sepsis   Chest pain, unspecified      Nuclear, May, 2008, no scar or ischemia   Diabetes mellitus     Diverticulosis     Dyslipidemia     Ejection fraction      EF 55-60%, echo, February, 2011 // Echocardiogram 8/21: EF 60-65, no RWMA, Gr 1 DD,  GLS -14%, normal RVSF, mild LAE, trivial MR, mild MS (mean 4 mmHg), RVSP 23.4    GERD (gastroesophageal reflux disease)     Mitral regurgitation April 21, 2009    mild,  echo, February, 2011   Osteoporosis     Palpitations      possible very brief atrial fibrillation on monitor and possible reentrant tachycardia   Psoriasis     Rheumatoid arthritis ([place])        Has the patient had major surgery during 100 days prior to admission? Yes   Family History   family history includes Diabetes in her father and mother; Heart attack in her father and mother; Heart failure in her father and mother; Stroke in her sister.   Current Medications   Current Facility-Administered Medications:    acetaminophen (TYLENOL) tablet 650 mg, 650 mg, Oral, Q6H PRN, 650 mg at 01/19/21 2229 **OR** acetaminophen (TYLENOL) suppository 650 mg, 650 mg, Rectal, Q6H PRN, Wylene Simmer, MD   apixaban Arne Cleveland) tablet 2.5 mg, 2.5 mg, Oral, BID, Cyndia Skeeters, Taye T, MD, 2.5 mg at 01/23/21 0825   atorvastatin  (LIPITOR) tablet 20 mg, 20 mg, Oral, QHS, Wylene Simmer, MD, 20 mg at 01/22/21 2135   bisacodyl (DULCOLAX) EC tablet 5 mg, 5 mg, Oral, Daily PRN, Wylene Simmer, MD   carvedilol (COREG) tablet 3.125 mg, 3.125 mg, Oral, BID WC, Cyndia Skeeters, Taye T, MD, 3.125 mg at 01/23/21 1724   cycloSPORINE (RESTASIS) 0.05 % ophthalmic emulsion 1 drop, 1 drop, Walker Eyes, BID, Wylene Simmer, MD, 1 drop at 01/23/21 0825   diclofenac Sodium (VOLTAREN) 1 % topical gel 2 g, 2 g, Topical, QID, Cyndia Skeeters, Taye T, MD, 2 g at 01/23/21 1726   feeding supplement (ENSURE ENLIVE / ENSURE PLUS) liquid 237 mL, 237 mL, Oral, BID BM, Wylene Simmer, MD, 237 mL at 01/23/21 1410   gabapentin (NEURONTIN) capsule 100 mg, 100 mg, Oral, TID PRN, Wylene Simmer, MD, 100 mg at 01/23/21 0325   hydrALAZINE (APRESOLINE) injection 5 mg, 5 mg, Intravenous, Q4H PRN, Wylene Simmer, MD   insulin aspart (novoLOG) injection 0-5 Units, 0-5 Units, Subcutaneous, QHS, Gonfa, Taye T, MD   insulin aspart (novoLOG) injection 0-9 Units, 0-9 Units, Subcutaneous, TID WC, Gonfa, Taye T, MD, 1 Units at 01/23/21 1724   insulin aspart (novoLOG) injection 3 Units, 3 Units, Subcutaneous, TID WC, Gonfa, Taye T, MD, 3 Units at 01/23/21 1725   insulin glargine-yfgn (SEMGLEE) injection 8 Units, 8 Units, Subcutaneous, Daily, Gonfa, Taye T, MD, 8 Units at 01/23/21 0827   lidocaine (LIDODERM) 5 % 1 patch, 1 patch, Transdermal, Q24H, Gonfa, Taye T, MD, 1 patch at 01/23/21 0830   lip balm (CARMEX) ointment, , Topical, PRN, Wendee Beavers T, MD, 75 application at 31/54/00 0654   metoCLOPramide (REGLAN) injection 5 mg, 5 mg, Intravenous, Q6H PRN, Cyndia Skeeters, Taye T, MD, 5 mg at 01/20/21 2126   morphine 2 MG/ML injection 2 mg, 2 mg, Intravenous, Q4H PRN, Corky Sing, PA-C, 2 mg at 01/22/21 8676   multivitamin (RENA-VIT) tablet 1 tablet, 1 tablet, Oral, QHS, Wylene Simmer, MD, 1 tablet at 01/22/21 2135   oxyCODONE (Oxy IR/ROXICODONE) immediate release tablet 5 mg, 5 mg, Oral, Q4H PRN, Wylene Simmer,  MD, 5 mg at 01/20/21 0245   pantoprazole (PROTONIX) EC tablet 40 mg, 40 mg, Oral, Daily, Wylene Simmer, MD, 40 mg at 01/23/21 0826   polyethylene glycol (MIRALAX / GLYCOLAX) packet 17 g, 17 g, Oral, Daily PRN, Wylene Simmer, MD   predniSONE (DELTASONE) tablet  4 mg, 4 mg, Oral, q AM, Wylene Simmer, MD, 4 mg at 01/23/21 4967   saccharomyces boulardii (FLORASTOR) capsule 250 mg, 250 mg, Oral, BID, Wylene Simmer, MD, 250 mg at 01/23/21 0825   traMADol (ULTRAM) tablet 50 mg, 50 mg, Oral, Q6H PRN, Wylene Simmer, MD, 50 mg at 01/23/21 1401   Patients Current Diet:  Diet Order                  Diet regular Room service appropriate? Yes; Fluid consistency: Thin  Diet effective now                         Precautions / Restrictions Precautions Precautions: Fall Other Brace: CAM boot when OOB and when WB RLE Restrictions Weight Bearing Restrictions: Yes RLE Weight Bearing: Non weight bearing Other Position/Activity Restrictions: CAM boot when OOB and when WB RLE    Has the patient had 2 or more falls or a fall with injury in the past year? No   Prior Activity Level Community (5-7x/wk): drives, gets out of house almost daily   Prior Functional Level Self Care: Did the patient need help bathing, dressing, using the toilet or eating? Independent   Indoor Mobility: Did the patient need assistance with walking from room to room (with or without device)? Independent   Stairs: Did the patient need assistance with internal or external stairs (with or without device)? Independent   Functional Cognition: Did the patient need help planning regular tasks such as shopping or remembering to take medications? Independent   Patient Information Are you of Hispanic, Latino/a,or Spanish origin?: A. No, not of Hispanic, Latino/a, or Spanish origin What is your race?: A. White Do you need or want an interpreter to communicate with a doctor or health care staff?: 0. No   Patient's Response To:  Health  Literacy and Transportation Is the patient able to respond to health literacy and transportation needs?: Yes Health Literacy - How often do you need to have someone help you when you read instructions, pamphlets, or other written material from your doctor or pharmacy?: Never In the past 12 months, has lack of transportation kept you from medical appointments or from getting medications?: No In the past 12 months, has lack of transportation kept you from meetings, work, or from getting things needed for daily living?: No   Development worker, international aid / [place] Devices/Equipment: CBG Meter, Eyeglasses, Environmental consultant (specify type), Cane (specify quad or straight), Dentures (specify type) (upper and lower dentures in) Home Equipment: Rolling Walker (2 wheels), Shower seat   Prior Device Use: Indicate devices/aids used by the patient prior to current illness, exacerbation or injury? Walker and cane   Current Functional Level Cognition   Overall Cognitive Status: Within Functional Limits for tasks assessed Orientation Level: Oriented X4 Safety/Judgement: Decreased awareness of safety, Decreased awareness of deficits General Comments: requires safety cues throughout mobility    Extremity Assessment (includes Sensation/Coordination)   Upper Extremity Assessment: Overall WFL for tasks assessed LUE Deficits / Details: Noted 10/10 pain when attempting shoulder flexion. Reports she had incident at home 3 weeks when she twisted her arm as she lost balance and has had this pain ever since. Pain decreased to 5/10 with passive flexion. Stated she had full functional use of RUE prior and is now with very limited ability to use functionally. Messaged ortho PA.  Lower Extremity Assessment: Defer to PT evaluation RLE Deficits / Details: limited by pain; able to  perform SLR RLE: Unable to fully assess due to pain     ADLs   Overall ADL's : Needs assistance/impaired Eating/Feeding: Set up,  Sitting Grooming: Brushing hair, Sitting Grooming Details (indicate cue type and reason): performed seated grooming task at sink Upper Body Bathing: Minimal assistance, Sitting Lower Body Bathing: Minimal assistance, Sitting/lateral leans Upper Body Dressing : Supervision/safety, Sitting Upper Body Dressing Details (indicate cue type and reason): donned gown seated in chair, able to stand to pull gown down while holding on to RW with 1 hand Lower Body Dressing: Maximal assistance, Sitting/lateral leans Lower Body Dressing Details (indicate cue type and reason): max A to don CAM boot sitting in chair Toilet Transfer: Minimal assistance, Ambulation, Rolling walker (2 wheels) Toileting- Clothing Manipulation and Hygiene: Moderate assistance, Sitting/lateral lean Functional mobility during ADLs: Minimal assistance, Moderate assistance General ADL Comments: Pt reported that she had just got back in bed after sponge bathing while sitting in chair. Pt reported 10/10 pain during activity and declined any OOB/EOB activity with OT as her pain had "just settled down." States that she was able to participate in bathing, but needed assistance due to pain and limited use of LUE.     Mobility   Overal bed mobility: Needs Assistance Bed Mobility: Sit to Supine Supine to sit: Supervision, HOB elevated Sit to supine: HOB elevated, Supervision General bed mobility comments: assist for RLE lift into bed, boost up in bed with bed pads.     Transfers   Overall transfer level: Needs assistance Equipment used: Rolling walker (2 wheels) Transfers: Sit to/from Stand, Bed to chair/wheelchair/BSC Sit to Stand: Min assist, From elevated surface, +2 physical assistance, Mod assist Bed to/from chair/wheelchair/BSC transfer type:: Step pivot Stand pivot transfers: Mod assist Step pivot transfers: Mod assist, Max assist General transfer comment: pt required mod x2 for initial sit to stand transfer, pt gradually  degreased to min A for transfers and ambulation during session     Ambulation / Gait / Stairs / Wheelchair Mobility         Posture / Balance Balance Overall balance assessment: Needs assistance Sitting-balance support: No upper extremity supported Sitting balance-Leahy Scale: Good Standing balance support: Bilateral upper extremity supported, During functional activity, Reliant on assistive device for balance Standing balance-Leahy Scale: Poor Standing balance comment: reliant on external support     Special needs/care consideration Skin Surgical incision: foot/right; Cellulitis: foot/right and Diabetic management novoLOG 0-9 units: 3x daily with meals; novoLOG 0-5 units: daily at bedtime; novoLOG 3 units: 3x daily with meals ; Semglee 8 units: daily    Previous Home Environment (from acute therapy documentation) Living Arrangements: Alone Available Help at Discharge: Family, Available 24 hours/day Type of Home: House Home Layout: Able to live on main level with bedroom/bathroom, Laundry or work area in basement Home Access: Stairs to enter Entrance Stairs-Rails: Horticulturist, commercial of Steps: 1 Bathroom Shower/Tub: Multimedia programmer: Handicapped height Bathroom Accessibility: Yes (pt thinks a walker may be able to fit in bathroom. She said she has never tried to take one into bathroom.) How Accessible: Accessible via walker Home Care Services: No Additional Comments: used cane intemittently, has used RW more recently due to foot pain   Discharge Living Setting Plans for Discharge Living Setting: Patient's home Type of Home at Discharge: House Discharge Home Layout: Able to live on main level with bedroom/bathroom, Laundry or work area in basement Discharge Home Access: Stairs to enter Entrance Stairs-Rails: Left Entrance Stairs-Number of Steps: 1 Discharge Bathroom  Shower/Tub: Pharmacologist Toilet: Handicapped height Discharge Bathroom  Accessibility: Yes (pt thinks a walker may be able to fit inside her bathroom. She has never tried to take one into the bathroom.) How Accessible: Accessible via walker Does the patient have any problems obtaining your medications?: No   Social/Family/Support Systems Anticipated Caregiver: [name], daughter and Jerah Esty, son Anticipated Caregiver's Contact Information: [name]: [phone]; Dean:[phone] Caregiver Availability: 24/7 Discharge Plan Discussed with Primary Caregiver: Yes Is Caregiver In Agreement with Plan?: Yes Does Caregiver/Family have Issues with Lodging/Transportation while Pt is in Rehab?: No   Goals Patient/Family Goal for Rehab: Supervision: PT/OT Expected length of stay: 7-10  days Pt/Family Agrees to Admission and willing to participate: Yes Program Orientation Provided & Reviewed with Pt/Caregiver Including Roles  & Responsibilities: Yes   Decrease burden of Care through IP rehab admission: NA   Possible need for SNF placement upon discharge: Not anticipated   Patient Condition: I have reviewed medical records from [facility], spoken with CM, and patient and family member. I met with patient at the bedside for inpatient rehabilitation assessment.  Patient will benefit from ongoing PT and OT, can actively participate in 3 hours of therapy a day 5 days of the week, and can make measurable gains during the admission.  Patient will also benefit from the coordinated team approach during an Inpatient Acute Rehabilitation admission.  The patient will receive intensive therapy as well as Rehabilitation physician, nursing, social worker, and care management interventions.  Due to safety, skin/wound care, disease management, medication administration, pain management, and patient education the patient requires 24 hour a day rehabilitation nursing.  The patient is currently min+2 with mobility and basic ADLs.  Discharge setting and therapy post discharge at home with home health is  anticipated.  Patient has agreed to participate in the Acute Inpatient Rehabilitation Program and will admit today.   Preadmission Screen Completed By:  Bethel Born, 01/23/2021 7:04 PM ______________________________________________________________________   Discussed status with Dr. Dagoberto Ligas  on 01/24/21 at 945 and received approval for admission today.   Admission Coordinator:  Bethel Born, CCC-SLP, time 1030/Date 01/24/21  with updates by Clemens Catholic, MS, CCC-SLP     Assessment/Plan: Diagnosis: Does the need for close, 24 hr/day Medical supervision in concert with the patient's rehab needs make it unreasonable for this patient to be served in a less intensive setting? Yes Co-Morbidities requiring supervision/potential complications: DM, R TMA due to diabetic foot infection and necrosis; HOH, HTN, RA, CKD3B; PAD, Afib on Eliquis.  Due to bladder management, bowel management, safety, skin/wound care, disease management, medication administration, pain management, and patient education, does the patient require 24 hr/day rehab nursing? Yes Does the patient require coordinated care of a physician, rehab nurse, PT, OT, and SLP to address physical and functional deficits in the context of the above medical diagnosis(es)? Yes Addressing deficits in the following areas: balance, endurance, locomotion, strength, transferring, bowel/bladder control, bathing, dressing, feeding, grooming, and toileting Can the patient actively participate in an intensive therapy program of at least 3 hrs of therapy 5 days a week? Yes The potential for patient to make measurable gains while on inpatient rehab is good Anticipated functional outcomes upon discharge from inpatient rehab: modified independent and supervision PT, modified independent and supervision OT, n/a SLP Estimated rehab length of stay to reach the above functional goals is: 7-10 days  Anticipated discharge destination: Home 10.  Overall Rehab/Functional Prognosis: good     MD Signature:

## 2021-01-25 ENCOUNTER — Encounter (HOSPITAL_COMMUNITY): Payer: Self-pay | Admitting: Orthopedic Surgery

## 2021-01-25 LAB — CBC WITH DIFFERENTIAL/PLATELET
Abs Immature Granulocytes: 0.04 10*3/uL (ref 0.00–0.07)
Basophils Absolute: 0 10*3/uL (ref 0.0–0.1)
Basophils Relative: 0 %
Eosinophils Absolute: 0.2 10*3/uL (ref 0.0–0.5)
Eosinophils Relative: 2 %
HCT: 26.4 % — ABNORMAL LOW (ref 36.0–46.0)
Hemoglobin: 8.6 g/dL — ABNORMAL LOW (ref 12.0–15.0)
Immature Granulocytes: 1 %
Lymphocytes Relative: 21 %
Lymphs Abs: 1.7 10*3/uL (ref 0.7–4.0)
MCH: 29.8 pg (ref 26.0–34.0)
MCHC: 32.6 g/dL (ref 30.0–36.0)
MCV: 91.3 fL (ref 80.0–100.0)
Monocytes Absolute: 0.7 10*3/uL (ref 0.1–1.0)
Monocytes Relative: 9 %
Neutro Abs: 5.5 10*3/uL (ref 1.7–7.7)
Neutrophils Relative %: 67 %
Platelets: 240 10*3/uL (ref 150–400)
RBC: 2.89 MIL/uL — ABNORMAL LOW (ref 3.87–5.11)
RDW: 13.6 % (ref 11.5–15.5)
WBC: 8.2 10*3/uL (ref 4.0–10.5)
nRBC: 0 % (ref 0.0–0.2)

## 2021-01-25 LAB — COMPREHENSIVE METABOLIC PANEL
ALT: 11 U/L (ref 0–44)
AST: 14 U/L — ABNORMAL LOW (ref 15–41)
Albumin: 2.2 g/dL — ABNORMAL LOW (ref 3.5–5.0)
Alkaline Phosphatase: 39 U/L (ref 38–126)
Anion gap: 5 (ref 5–15)
BUN: 41 mg/dL — ABNORMAL HIGH (ref 8–23)
CO2: 28 mmol/L (ref 22–32)
Calcium: 8.5 mg/dL — ABNORMAL LOW (ref 8.9–10.3)
Chloride: 107 mmol/L (ref 98–111)
Creatinine, Ser: 1.15 mg/dL — ABNORMAL HIGH (ref 0.44–1.00)
GFR, Estimated: 47 mL/min — ABNORMAL LOW (ref >60)
Glucose, Bld: 148 mg/dL — ABNORMAL HIGH (ref 70–99)
Potassium: 4.8 mmol/L (ref 3.5–5.1)
Sodium: 140 mmol/L (ref 135–145)
Total Bilirubin: 0.5 mg/dL (ref 0.3–1.2)
Total Protein: 5.1 g/dL — ABNORMAL LOW (ref 6.5–8.1)

## 2021-01-25 LAB — GLUCOSE, CAPILLARY
Glucose-Capillary: 239 mg/dL — ABNORMAL HIGH (ref 70–99)
Glucose-Capillary: 200 mg/dL — ABNORMAL HIGH (ref 70–99)
Glucose-Capillary: 171 mg/dL — ABNORMAL HIGH (ref 70–99)
Glucose-Capillary: 191 mg/dL — ABNORMAL HIGH (ref 70–99)

## 2021-01-25 MED ORDER — SORBITOL 70 % SOLN
30.0000 mL | Freq: Once | Status: AC
  Administered 2021-01-25: 30 mL via ORAL
  Filled 2021-01-25: qty 30

## 2021-01-25 NOTE — Progress Notes (Signed)
Inpatient Rehabilitation Admission Medication Review by a Pharmacist  A complete drug regimen review was completed for this patient to identify any potential clinically significant medication issues.  High Risk Drug Classes Is patient taking? Indication by Medication  Antipsychotic No   Anticoagulant Yes Apixaban, Afib  Antibiotic No   Opioid Yes Oxycodone for pain  Antiplatelet No   Hypoglycemics/insulin Yes SSI, Semglee for DM  Vasoactive Medication Yes Carvedilol for BP, Afib  Chemotherapy No   Other No      Type of Medication Issue Identified Description of Issue Recommendation(s)  Drug Interaction(s) (clinically significant)     Duplicate Therapy     Allergy     No Medication Administration End Date     Incorrect Dose     Additional Drug Therapy Needed     Significant med changes from prior encounter (inform family/care partners about these prior to discharge).    Other       Clinically significant medication issues were identified that warrant physician communication and completion of prescribed/recommended actions by midnight of the next day:  No  Pharmacist comments: Resume supplements as discharge  Time spent performing this drug regimen review (minutes):  20 minutes   Tamorah, Hada 01/25/2021 8:05 AM

## 2021-01-25 NOTE — Progress Notes (Signed)
[facility] Individual Statement of Services  Patient Name:  Shelby Walker  Date:  01/25/2021  Welcome to the [facility].  Our goal is to provide you with an individualized program based on your diagnosis and situation, designed to meet your specific needs.  With this comprehensive rehabilitation program, you will be expected to participate in at least 3 hours of rehabilitation therapies Monday-Friday, with modified therapy programming on the weekends.  Your rehabilitation program will include the following services:  Physical Therapy (PT), Occupational Therapy (OT), 24 hour per day rehabilitation nursing, Therapeutic Recreaction (TR), Care Coordinator, Rehabilitation Medicine, Nutrition Services, and Pharmacy Services  Weekly team conferences will be held on Tuesday to discuss your progress.  Your Inpatient Rehabilitation Care Coordinator will talk with you frequently to get your input and to update you on team discussions.  Team conferences with you and your family in attendance may also be held.  Expected length of stay: 7-10 DAYS  Overall anticipated outcome: INDEPENDENT-SUPERVISION LEVEL  Depending on your progress and recovery, your program may change. Your Inpatient Rehabilitation Care Coordinator will coordinate services and will keep you informed of any changes. Your Inpatient Rehabilitation Care Coordinator's name and contact numbers are listed  below.  The following services may also be recommended but are not provided by the [facility] [place]:   [place] will be made to provide these services after discharge if needed.  Arrangements include referral to agencies that provide these services.  Your insurance has been verified to be:  [place] primary doctor is:  Reynold Bowen  Pertinent information will be shared with your doctor  and your insurance company.  Inpatient Rehabilitation Care Coordinator:  Ovidio Kin, [place] or ([phone]  Information discussed with and copy given to patient by: Elease Hashimoto, 01/25/2021, 11:00 AM

## 2021-01-25 NOTE — Progress Notes (Signed)
Occupational Therapy Session Note  Patient Details  Name: Shelby Walker MRN: [id number] Date of Birth: 11-12-1933  Today's Date: 01/25/2021 OT Individual Time: 1415-1506 OT Individual Time Calculation (min): 51 min    Short Term Goals: Week 1:  OT Short Term Goal 1 (Week 1): STGs = LTGs 2/2 LOS at Mod I/Set up  Skilled Therapeutic Interventions/Progress Updates:  Pt greeted seated in recliner agreeable to OT intervention, session focus on BADL reeducation, functional mobility, IADL tasks, BUE strength/endurance, functional mobility and increasing overall activity tolerance to promote increased independence with ADL participation. Worked on donning CAM boot from recliner as pt reports she gets dressed from a low chair in her room, MOD cues needed to don boot from recliner. Pt transported to gym with total A for time mgmt. Pt completed IADL task where pt instructed to gather items around gym out of BOS to simulate cleaning up at home, pt completed task with CGA. Pt reports she does have walker bag at home to decrease risk of falls at home when transporting items. Briefly discussed kitchen safety however pt would benefit from further education as pt reports she mostly uses her kitchen to microwave meals that her daughters bring her. Pt reports increased pain in RLE after IADL task therefore elevated RLE and focused attention on BUE strength. Pt completed below therex with 1lb weighted dowel to promote increased strength and endurance for higher level functional mobility tasks X10 bicep curls X10 shoulder flexion to 90* X10 chest presses X10 forward rows Pt transported back to room from w/c with total A where pt completed stand pivot transfer from w/c>EOB with CGA. CGA for sit>lying. Pt left supine in bed with all needs within reach. Did change RLE w/c leg rest to elevating for comfort and pain mgmt.   Therapy Documentation Precautions:  Precautions Precautions: Fall Required Braces or Orthoses:  Other Brace Other Brace: WBAT RLE with CAM boot Restrictions Weight Bearing Restrictions: Yes RLE Weight Bearing: Weight bearing as tolerated Other Position/Activity Restrictions: CAM boot when OOB and when WB RLE  Pain: Pt reported 7/10 pain in RLE, provided repositioning and rest breaks as pain mgmt strategy   Therapy/Group: Individual Therapy  Precious Haws 01/25/2021, 3:53 PM

## 2021-01-25 NOTE — Evaluation (Signed)
Occupational Therapy Assessment and Plan  Patient Details  Name: Shelby Walker MRN: [id number] Date of Birth: November 29, 1933  OT Diagnosis: abnormal posture, acute pain, and muscle weakness (generalized) Rehab Potential:   ELOS: 7-10 days   Today's Date: 01/25/2021 OT Individual Time: 2992-4268 OT Individual Time Calculation (min): 46 min     Hospital Problem: Principal Problem:   S/P transmetatarsal amputation of foot, right ([place])   Past Medical History:  Past Medical History:  Diagnosis Date   Bruit    Carotid Doppler showed no significant abnormality     9per patient)   C. difficile colitis 07/2018   with severe sepsis   Chest pain, unspecified    Nuclear, May, 2008, no scar or ischemia   Diabetes mellitus    Diverticulosis    Dyslipidemia    Ejection fraction    EF 55-60%, echo, February, 2011 // Echocardiogram 8/21: EF 60-65, no RWMA, Gr 1 DD, GLS -14%, normal RVSF, mild LAE, trivial MR, mild MS (mean 4 mmHg), RVSP 23.4    GERD (gastroesophageal reflux disease)    Mitral regurgitation April 21, 2009   mild,  echo, February, 2011   Osteoporosis    Palpitations    possible very brief atrial fibrillation on monitor and possible reentrant tachycardia   Psoriasis    Rheumatoid arthritis ([place])    Past Surgical History:  Past Surgical History:  Procedure Laterality Date   ABDOMINAL AORTOGRAM W/LOWER EXTREMITY Right 10/19/2020   Procedure: ABDOMINAL AORTOGRAM W/LOWER EXTREMITY;  Surgeon: Wellington Hampshire, MD;  Location: [facility];  Service: Cardiovascular;  Laterality: Right;   FRACTURE SURGERY     TRANSMETATARSAL AMPUTATION Right 01/18/2021   Procedure: TRANSMETATARSAL AMPUTATION;  Surgeon: Wylene Simmer, MD;  Location: [facility];  Service: Orthopedics;  Laterality: Right;    Assessment & Plan Clinical Impression: Shelby Walker is an 87 year old female with history of T2DM, c diff colitis w/sepsis, GERD, RA, CKD, PAD, A fib- on Eliquis , diabetic foot ulcer who was  admitted on 12/16/20 with gangrenous changes with necrosis and purulent drainage. She was started on broad spectrum antibiotics and underwent Left transmetatarsal amputation on 11/16 by Dr. Doran Durand. Post op to be WBAT in CAM boot and has had issues with pain control requiring IV morphine. Blood sugars have been labile but improving and ABLA being monitored. Therapy ongoing and patient noted to have limitations in mobility and ability to carry out ADLs. CIR recommended due to functional decline.   Patient transferred to CIR on 01/24/2021 .    Patient currently requires min with basic self-care skills secondary to muscle weakness, decreased cardiorespiratoy endurance, decreased coordination and decreased motor planning, and decreased standing balance, decreased postural control, and decreased balance strategies.  Prior to hospitalization, patient could complete BADL and IADL  with independent .  Patient will benefit from skilled intervention to decrease level of assist with basic self-care skills, increase independence with basic self-care skills, and increase level of independence with iADL prior to discharge home independently with family to assist PRN (per pt).  Anticipate patient will require intermittent supervision and follow up home health.  OT - End of Session Activity Tolerance: Tolerates 30+ min activity with multiple rests Endurance Deficit: Yes OT Assessment OT Patient demonstrates impairments in the following area(s): Balance;Endurance;Motor;Pain;Safety OT Basic ADL's Functional Problem(s): Grooming;Bathing;Dressing;Toileting OT Advanced ADL's Functional Problem(s): Simple Meal Preparation OT Transfers Functional Problem(s): Toilet;Tub/Shower OT Additional Impairment(s): None OT Plan OT Intensity: Minimum of 1-2 x/day, 45 to 90  minutes OT Frequency: 5 out of 7 days OT Duration/Estimated Length of Stay: 7-10 days OT Treatment/Interventions: Balance/vestibular training;Discharge  planning;Pain management;Self Care/advanced ADL retraining;Therapeutic Activities;UE/LE Coordination activities;Visual/perceptual remediation/compensation;Therapeutic Exercise;Skin care/wound managment;Patient/family education;Functional mobility training;Disease mangement/prevention;Cognitive remediation/compensation;Community reintegration;DME/adaptive equipment instruction;Neuromuscular re-education;Psychosocial support;Splinting/orthotics;UE/LE Strength taining/ROM;Wheelchair propulsion/positioning OT Self Feeding Anticipated Outcome(s): Independent OT Basic Self-Care Anticipated Outcome(s): Supervision bathe, Set up/Mod I all else OT Toileting Anticipated Outcome(s): Mod I OT Bathroom Transfers Anticipated Outcome(s): Mod I OT Recommendation Recommendations for Other Services: Therapeutic Recreation consult Therapeutic Recreation Interventions: Stress management;Outing/community reintergration Patient destination: Home Follow Up Recommendations: Home health OT Equipment Recommended: To be determined   OT Evaluation Precautions/Restrictions  Precautions Precautions: Fall Required Braces or Orthoses: Other Brace Other Brace: WBAT RLE with CAM boot Restrictions Weight Bearing Restrictions: Yes RLE Weight Bearing: Weight bearing as tolerated Other Position/Activity Restrictions: CAM boot when OOB and when WB RLE Pain Pain Assessment Pain Scale: 0-10 Pain Score: 8  (only in standing) Home Living/Prior Functioning Home Living Living Arrangements: Alone Available Help at Discharge: Family (documentation staters 24/7, patient states intermittent) Type of Home: House Home Layout: Able to live on main level with bedroom/bathroom, Laundry or work area in basement [employer] Shower/Tub: Multimedia programmer: Handicapped height Bathroom Accessibility:  (pt states has to turn RW sideways but TBD)  Lives With: Alone IADL History Homemaking Responsibilities: Yes Prior  Function Level of Independence: Independent with basic ADLs, Independent with gait, Independent with homemaking with ambulation, Independent with transfers Vision Baseline Vision/History: 0 No visual deficits Ability to See in Adequate Light: 0 Adequate Patient Visual Report: No change from baseline Vision Assessment?: No apparent visual deficits Perception  Perception: Within Functional Limits Praxis Praxis: Intact Cognition Overall Cognitive Status: Within Functional Limits for tasks assessed Arousal/Alertness: Awake/alert Orientation Level: Person;Place;Situation Person: Oriented Place: Oriented Situation: Oriented Year: 2022 Month: November Day of Week: Correct Memory: Appears intact Immediate Memory Recall: Sock;Blue;Bed Memory Recall Sock: Without Cue Memory Recall Blue: Without Cue Memory Recall Bed: With Cue Awareness: Appears intact Problem Solving: Appears intact Safety/Judgment: Appears intact Sensation Sensation Light Touch: Appears Intact Coordination Gross Motor Movements are Fluid and Coordinated: No Fine Motor Movements are Fluid and Coordinated: Yes Coordination and Movement Description: grossly uncoordinated 2/2 RLE CAM boot and pain Motor  Motor Motor: Within Functional Limits Motor - Skilled Clinical Observations: arthritic changes limiting, decreased coorindation 2/2 CAM boot  Trunk/Postural Assessment  Cervical Assessment Cervical Assessment: Within Functional Limits Thoracic Assessment Thoracic Assessment: Exceptions to [facility] Lumbar Assessment Lumbar Assessment: Exceptions to [facility] Postural Control Postural Control: Within Functional Limits  Balance Balance Balance Assessed: Yes Static Sitting Balance Static Sitting - Balance Support: Feet supported Static Sitting - Level of Assistance: 5: Stand by assistance Dynamic Sitting Balance Dynamic Sitting - Balance Support: Feet supported Dynamic Sitting - Level of Assistance: 5: Stand by  assistance Dynamic Sitting - Balance Activities: Lateral lean/weight shifting;Forward lean/weight shifting;Reaching for objects Static Standing Balance Static Standing - Balance Support: During functional activity Static Standing - Level of Assistance: 4: Min assist Dynamic Standing Balance Dynamic Standing - Balance Support: During functional activity Dynamic Standing - Level of Assistance: 4: Min assist Dynamic Standing - Balance Activities: Lateral lean/weight shifting;Forward lean/weight shifting Extremity/Trunk Assessment RUE Assessment RUE Assessment: Exceptions to [facility] Passive Range of Motion (PROM) Comments: [facility] Strength Comments: roughly 3+/5, weak and deconditioned, arthritic changes LUE Assessment LUE Assessment: Exceptions to [facility] Passive Range of Motion (PROM) Comments: [facility] Strength Comments: roughly 3+/5, weak and deconditioned, arthritic changes, has increased pain at  shoulder but ROM functional  Care Tool Care Tool Self Care Eating   Eating Assist Level: Set up assist    Oral Care    Oral Care Assist Level: Set up assist    Bathing   Body parts bathed by patient: Right arm;Left arm;Chest;Abdomen;Front perineal area;Right upper leg;Left upper leg;Face Body parts bathed by helper: Left lower leg;Buttocks Body parts n/a: Right lower leg Assist Level: Minimal Assistance - Patient > 75%    Upper Body Dressing(including orthotics)   What is the patient wearing?: Bra;Pull over shirt   Assist Level: Minimal Assistance - Patient > 75%    Lower Body Dressing (excluding footwear)   What is the patient wearing?: Pants Assist for lower body dressing: Minimal Assistance - Patient > 75%    Putting on/Taking off footwear   What is the patient wearing?: Orthosis Assist for footwear: Dependent - Patient 0%       Care Tool Toileting Toileting activity   Assist for toileting: Minimal Assistance - Patient > 75%     Care Tool Bed Mobility Roll left and  right activity    CGA    Sit to lying activity    CGA    Lying to sitting on side of bed activity    CGA     Care Tool Transfers Sit to stand transfer   Sit to stand assist level: Minimal Assistance - Patient > 75%    Chair/bed transfer   Chair/bed transfer assist level: Minimal Assistance - Patient > 75%     Toilet transfer   Assist Level: Minimal Assistance - Patient > 75%     Care Tool Cognition  Expression of Ideas and Wants Expression of Ideas and Wants: 4. Without difficulty (complex and basic) - expresses complex messages without difficulty and with speech that is clear and easy to understand  Understanding Verbal and Non-Verbal Content Understanding Verbal and Non-Verbal Content: 4. Understands (complex and basic) - clear comprehension without cues or repetitions   Memory/Recall Ability Memory/Recall Ability : Current season;Staff names and faces;That he or she is in a hospital/hospital unit   Refer to Care Plan for [place] 1 OT Short Term Goal 1 (Week 1): STGs = LTGs 2/2 LOS at Mod I/Set up  Recommendations for other services: Therapeutic Recreation  Stress management and Outing/community reintegration   Skilled Therapeutic Intervention ADL ADL Eating: Set up Grooming: Setup Upper Body Bathing: Minimal assistance Where Assessed-Upper Body Bathing: Sitting at sink Lower Body Bathing: Minimal assistance Where Assessed-Lower Body Bathing: Sitting at sink Upper Body Dressing: Minimal assistance Where Assessed-Upper Body Dressing: Sitting at sink Lower Body Dressing: Minimal assistance Where Assessed-Lower Body Dressing: Wheelchair Toileting: Minimal assistance Toilet Transfer: Minimal assistance Toilet Transfer Method: Stand pivot Mobility  Bed Mobility Bed Mobility: Supine to Sit Supine to Sit: Contact Guard/Touching assist Transfers Sit to Stand: Minimal Assistance - Patient > 75% Stand to Sit: Minimal Assistance - Patient  > 75%   Skilled Intervention: Pt greeted at time of session semireclined in bed finished eating breakfast and agreeable to OT eval and session. Discussed role and purpose of OT, pt agreeable to ADL tasks. See above and below for details. Supine > sit CGA and donned CAM boot dependent. Note pt had several questions about wearing CAM boot and future adaptations which were answered. stand pivot bed > recliner Min A with RW. Set up at sink level in recliner and performed UB/LB bathing with Min A overall. Donned bra with  Min A and pull over shirt with set up. Removed boot in order to don pants, then reapplied boot with patient ed regarding sequencing of CAM boot and when to don/doff for ADL tasks. Stand pivot back to bed Min A with improved smoothness compared to previous transfer. Sit > supine Supervision. Alarm on call bell in reach. Note extended time needed for all tasks 2/2 slow pace and extensive time for education.    Discharge Criteria: Patient will be discharged from OT if patient refuses treatment 3 consecutive times without medical reason, if treatment goals not met, if there is a change in medical status, if patient makes no progress towards goals or if patient is discharged from hospital.  The above assessment, treatment plan, treatment alternatives and goals were discussed and mutually agreed upon: by patient  Viona Gilmore 01/25/2021, 11:45 AM

## 2021-01-25 NOTE — Progress Notes (Signed)
   01/25/21 1015  Clinical Encounter Type  Visited With Patient and family together  Visit Type Initial;Spiritual support  Referral From Nurse  Consult/Referral To Chaplain   Chaplain Jorene Guest responded to the consult request for an Advance Directive. Ike Bene provided education to the patient and her son, Marlou Sa. The patient said do not think she needs it and Dean informed Ike Bene that the patient has a living will. Dean requested Ike Bene to pray. This note was prepared by Jeanine Luz, M.Div..  For questions please contact by phone [phone].

## 2021-01-25 NOTE — Addendum Note (Signed)
Addendum  created 01/25/21 1115 by Josephine Igo, CRNA   Intraprocedure Event edited

## 2021-01-25 NOTE — Plan of Care (Signed)
  Problem: RH Balance Goal: LTG Patient will maintain dynamic standing with ADLs (OT) Description: LTG:  Patient will maintain dynamic standing balance with assist during activities of daily living (OT)  Flowsheets (Taken 01/25/2021 1155) LTG: Pt will maintain dynamic standing balance during ADLs with: Independent with assistive device   Problem: Sit to Stand Goal: LTG:  Patient will perform sit to stand in prep for activites of daily living with assistance level (OT) Description: LTG:  Patient will perform sit to stand in prep for activites of daily living with assistance level (OT) Flowsheets (Taken 01/25/2021 1155) LTG: PT will perform sit to stand in prep for activites of daily living with assistance level: Independent with assistive device   Problem: RH Eating Goal: LTG Patient will perform eating w/assist, cues/equip (OT) Description: LTG: Patient will perform eating with assist, with/without cues using equipment (OT) Flowsheets (Taken 01/25/2021 1155) LTG: Pt will perform eating with assistance level of: Independent   Problem: RH Grooming Goal: LTG Patient will perform grooming w/assist,cues/equip (OT) Description: LTG: Patient will perform grooming with assist, with/without cues using equipment (OT) Flowsheets (Taken 01/25/2021 1155) LTG: Pt will perform grooming with assistance level of: Independent with assistive device    Problem: RH Bathing Goal: LTG Patient will bathe all body parts with assist levels (OT) Description: LTG: Patient will bathe all body parts with assist levels (OT) Flowsheets (Taken 01/25/2021 1155) LTG: Pt will perform bathing with assistance level/cueing: Supervision/Verbal cueing   Problem: RH Dressing Goal: LTG Patient will perform upper body dressing (OT) Description: LTG Patient will perform upper body dressing with assist, with/without cues (OT). Flowsheets (Taken 01/25/2021 1155) LTG: Pt will perform upper body dressing with assistance level of:  Set up assist Goal: LTG Patient will perform lower body dressing w/assist (OT) Description: LTG: Patient will perform lower body dressing with assist, with/without cues in positioning using equipment (OT) Flowsheets (Taken 01/25/2021 1155) LTG: Pt will perform lower body dressing with assistance level of: Set up assist   Problem: RH Toileting Goal: LTG Patient will perform toileting task (3/3 steps) with assistance level (OT) Description: LTG: Patient will perform toileting task (3/3 steps) with assistance level (OT)  Flowsheets (Taken 01/25/2021 1155) LTG: Pt will perform toileting task (3/3 steps) with assistance level: Independent with assistive device   Problem: RH Simple Meal Prep Goal: LTG Patient will perform simple meal prep w/assist (OT) Description: LTG: Patient will perform simple meal prep with assistance, with/without cues (OT). Flowsheets (Taken 01/25/2021 1155) LTG: Pt will perform simple meal prep with assistance level of: Independent with assistive device   Problem: RH Toilet Transfers Goal: LTG Patient will perform toilet transfers w/assist (OT) Description: LTG: Patient will perform toilet transfers with assist, with/without cues using equipment (OT) Flowsheets (Taken 01/25/2021 1155) LTG: Pt will perform toilet transfers with assistance level of: Independent with assistive device   Problem: RH Tub/Shower Transfers Goal: LTG Patient will perform tub/shower transfers w/assist (OT) Description: LTG: Patient will perform tub/shower transfers with assist, with/without cues using equipment (OT) Flowsheets (Taken 01/25/2021 1155) LTG: Pt will perform tub/shower stall transfers with assistance level of: Supervision/Verbal cueing

## 2021-01-25 NOTE — Evaluation (Signed)
Physical Therapy Assessment and Plan  Patient Details  Name: Shelby Walker MRN: [id number] Date of Birth: [birth year]  PT Diagnosis: Abnormality of gait, Difficulty walking, and Pain in joint Rehab Potential: Good ELOS: 7-10 days   Today's Date: 01/25/2021 PT Individual Time: 1300-1400 PT Individual Time Calculation (min): 60 min    Hospital Problem: Principal Problem:   S/P transmetatarsal amputation of foot, right ([place])   Past Medical History:  Past Medical History:  Diagnosis Date   Bruit    Carotid Doppler showed no significant abnormality     9per patient)   C. difficile colitis 07/2018   with severe sepsis   Chest pain, unspecified    Nuclear, May, 2008, no scar or ischemia   Diabetes mellitus    Diverticulosis    Dyslipidemia    Ejection fraction    EF 55-60%, echo, February, 2011 // Echocardiogram 8/21: EF 60-65, no RWMA, Gr 1 DD, GLS -14%, normal RVSF, mild LAE, trivial MR, mild MS (mean 4 mmHg), RVSP 23.4    GERD (gastroesophageal reflux disease)    Mitral regurgitation April 21, 2009   mild,  echo, February, 2011   Osteoporosis    Palpitations    possible very brief atrial fibrillation on monitor and possible reentrant tachycardia   Psoriasis    Rheumatoid arthritis ([place])    Past Surgical History:  Past Surgical History:  Procedure Laterality Date   ABDOMINAL AORTOGRAM W/LOWER EXTREMITY Right 10/19/2020   Procedure: ABDOMINAL AORTOGRAM W/LOWER EXTREMITY;  Surgeon: Wellington Hampshire, MD;  Location: [employer] [facility];  Service: Cardiovascular;  Laterality: Right;   FRACTURE SURGERY     TRANSMETATARSAL AMPUTATION Right 01/18/2021   Procedure: TRANSMETATARSAL AMPUTATION;  Surgeon: Wylene Simmer, MD;  Location: [place];  Service: Orthopedics;  Laterality: Right;    Assessment & Plan Clinical Impression:  Shelby Walker is an 87 year old female with history of T2DM, c diff colitis w/sepsis, GERD, RA, CKD, PAD, A fib- on Eliquis , diabetic foot ulcer who was  admitted on 12/16/20 with gangrenous changes with necrosis and purulent drainage. She was started on broad spectrum antibiotics and underwent Left transmetatarsal amputation on 11/16 by Dr. Doran Durand. Post op to be WBAT in CAM boot and has had issues with pain control requiring IV morphine. Blood sugars have been labile but improving and ABLA being monitored. Therapy ongoing and patient noted to have limitations in mobility and ability to carry out ADLs. CIR recommended due to functional decline. Patient transferred to CIR on 01/24/2021 .   Patient currently requires min with mobility secondary to muscle weakness, decreased cardiorespiratoy endurance, and decreased standing balance and pain .  Prior to hospitalization, patient was independent  with mobility and lived with Alone in a House home.  Home access is 2Stairs to enter.  Patient will benefit from skilled PT intervention to maximize safe functional mobility, minimize fall risk, and decrease caregiver burden for planned discharge home with intermittent assist.  Anticipate patient will benefit from follow up [facility] at discharge.  PT - End of Session Activity Tolerance: Tolerates 30+ min activity with multiple rests Endurance Deficit: Yes Endurance Deficit Description: frequent rest breaks during functional activity PT Assessment Rehab Potential (ACUTE/IP ONLY): Good PT Barriers to Discharge: Decreased caregiver support;Home environment access/layout;Weight bearing restrictions PT Patient demonstrates impairments in the following area(s): Balance;Endurance;Pain;Safety PT Transfers Functional Problem(s): Bed Mobility;Bed to Chair;Car;Furniture;Floor PT Locomotion Functional Problem(s): Ambulation;Wheelchair Mobility;Stairs PT Plan PT Intensity: Minimum of 1-2 x/day ,45 to 90 minutes PT Frequency:  5 out of 7 days PT Duration Estimated Length of Stay: 7-10 days PT Treatment/Interventions: Ambulation/gait training;Balance/vestibular training;Community  reintegration;Discharge planning;DME/adaptive equipment instruction;Functional mobility training;Pain management;Patient/family education;Stair training;Therapeutic Activities;UE/LE Strength taining/ROM;UE/LE Coordination activities;Therapeutic Exercise PT Transfers Anticipated Outcome(s): mod I PT Locomotion Anticipated Outcome(s): mod I with LRAD at ambulatory level PT Recommendation Follow Up Recommendations: Home health PT Patient destination: Home Equipment Recommended: To be determined Equipment Details: pt already owns [facility] and RW   PT Evaluation Precautions/Restrictions Precautions Precautions: Fall Required Braces or Orthoses: Other Brace Other Brace: WBAT RLE with CAM boot Restrictions Weight Bearing Restrictions: Yes RLE Weight Bearing: Weight bearing as tolerated Other Position/Activity Restrictions: CAM boot when OOB and when WB RLE Pain Interference Pain Interference Pain Effect on Sleep: 2. Occasionally Pain Interference with Therapy Activities: 3. Frequently Pain Interference with Day-to-Day Activities: 3. Frequently Home Living/Prior Functioning Home Living Available Help at Discharge: Family;Available PRN/intermittently Type of Home: House Home Access: Stairs to enter Entrance Stairs-Number of Steps: 2 Entrance Stairs-Rails: Left Home Layout: Able to live on main level with bedroom/bathroom;Laundry or work area in [employer] With: Alone Prior Function Level of Independence: Independent with gait;Independent with transfers;Requires assistive device for independence  Able to Take Stairs?: Yes Driving: Yes (per pt report surface streets only, no highway driving) Vision/Perception  Vision - History Baseline Vision: Wears glasses all the time Patient Visual Report: No change from baseline Perception Perception: Within Functional Limits Praxis Praxis: Intact  Cognition Overall Cognitive Status: Within Functional Limits for tasks  assessed Arousal/Alertness: Awake/alert Orientation Level: Oriented X4 Year: 2022 Attention: Focused;Sustained Focused Attention: Appears intact Sustained Attention: Appears intact Memory: Appears intact Awareness: Appears intact Problem Solving: Appears intact Safety/Judgment: Appears intact Sensation Sensation Light Touch: Appears Intact Proprioception: Appears Intact Coordination Gross Motor Movements are Fluid and Coordinated: No Fine Motor Movements are Fluid and Coordinated: Yes Coordination and Movement Description: grossly uncoordinated 2/2 RLE CAM boot and pain Motor  Motor Motor: Within Functional Limits Motor - Skilled Clinical Observations: arthritic changes limiting, decreased coorindation 2/2 CAM boot   Trunk/Postural Assessment  Cervical Assessment Cervical Assessment: Within Functional Limits Thoracic Assessment Thoracic Assessment: Exceptions to [facility] (kyphotic) Lumbar Assessment Lumbar Assessment: Exceptions to [facility] (posterior pelvic tilt) Postural Control Postural Control: Within Functional Limits  Balance Balance Balance Assessed: Yes Static Sitting Balance Static Sitting - Balance Support: Feet supported Static Sitting - Level of Assistance: 5: Stand by assistance Dynamic Sitting Balance Dynamic Sitting - Balance Support: Feet supported Dynamic Sitting - Level of Assistance: 5: Stand by assistance Dynamic Sitting - Balance Activities: Lateral lean/weight shifting;Forward lean/weight shifting;Reaching for Consulting civil engineer Standing - Balance Support: Bilateral upper extremity supported;During functional activity Static Standing - Level of Assistance: 4: Min assist Dynamic Standing Balance Dynamic Standing - Balance Support: During functional activity Dynamic Standing - Level of Assistance: 4: Min assist Dynamic Standing - Balance Activities: Lateral lean/weight shifting;Forward lean/weight shifting Extremity Assessment  RUE  Assessment RUE Assessment: Exceptions to [facility] Passive Range of Motion (PROM) Comments: [facility] General Strength Comments: roughly 3+/5, weak and deconditioned, arthritic changes LUE Assessment LUE Assessment: Exceptions to [facility] Passive Range of Motion (PROM) Comments: [facility] General Strength Comments: roughly 3+/5, weak and deconditioned, arthritic changes, has increased pain at shoulder but ROM functional RLE Assessment RLE Assessment: Exceptions to [facility] RLE Strength Right Hip Flexion: 4/5 Right Knee Flexion: 5/5 Right Knee Extension: 4/5 Right Ankle Dorsiflexion:  (not tested due to recent surgery) LLE Assessment LLE Assessment: Within Functional Limits LLE Strength Left Hip Flexion:  4/5 Left Knee Flexion: 5/5 Left Knee Extension: 5/5 Left Ankle Dorsiflexion: 5/5  Care Tool Care Tool Bed Mobility Roll left and right activity   Roll left and right assist level: Contact Guard/Touching assist    Sit to lying activity   Sit to lying assist level: Contact Guard/Touching assist    Lying to sitting on side of bed activity   Lying to sitting on side of bed assist level: the ability to move from lying on the back to sitting on the side of the bed with no back support.: Contact Guard/Touching assist     Care Tool Transfers Sit to stand transfer   Sit to stand assist level: Minimal Assistance - Patient > 75%    Chair/bed transfer   Chair/bed transfer assist level: Minimal Assistance - Patient > 75%     Toilet transfer   Assist Level: Minimal Assistance - Patient > 75%    Car transfer Car transfer activity did not occur: Safety/medical concerns        Care Tool Locomotion Ambulation   Assist level: Minimal Assistance - Patient > 75% Assistive device: Walker-rolling Max distance: 35'  Walk 10 feet activity   Assist level: Minimal Assistance - Patient > 75% Assistive device: Walker-rolling   Walk 50 feet with 2 turns activity Walk 50 feet with 2 turns activity did not occur:  Safety/medical concerns      Walk 150 feet activity Walk 150 feet activity did not occur: Safety/medical concerns      Walk 10 feet on uneven surfaces activity Walk 10 feet on uneven surfaces activity did not occur: Safety/medical concerns      Stairs Stair activity did not occur: Safety/medical concerns        Walk up/down 1 step activity        Walk up/down 4 steps activity Walk up/down 4 steps activity did not occur: Safety/medical concerns      Walk up/down 12 steps activity Walk up/down 12 steps activity did not occur: Safety/medical concerns      Pick up small objects from floor Pick up small object from the floor (from standing position) activity did not occur: Safety/medical concerns      Wheelchair Is the patient using a wheelchair?: No Type of Wheelchair: Manual   Wheelchair assist level: Dependent - Patient 0% Max wheelchair distance: 150'  Wheel 50 feet with 2 turns activity   Assist Level: Dependent - Patient 0%  Wheel 150 feet activity   Assist Level: Dependent - Patient 0%    Refer to Care Plan for [place] 1 PT Short Term Goal 1 (Week 1): =LTG due to ELOS  Recommendations for other services: None   Skilled Therapeutic Intervention Evaluation completed (see details above and below) with education on PT POC and goals and individual treatment initiated with focus on functional transfer and gait assessment, orientation to rehab unit and schedule, and setting pt up with appropriate equipment to be utilized during rehab stay. Pt received seated in bed having just finished lunch, agreeable to PT session. Pt reports some pain in RLE at rest, not rated and declines intervention as she has been premedicated for pain. Pt is dependent to don CAM boot for RLE. Supine to sit at Supervision level. Pt reports urge to use the bathroom. Sit to stand with min A to RW, stand pivot transfer to [facility] with RW and min A. Pt exhibits impaired standing  balance while wearing R CAM boot and  hospital sock on LLE. Obtained pt's tennis shoe for LLE, pt exhibits improved balance with addition of shoe. Stand pivot transfer to w/c with RW and min A. Sit to stand with min A to RW from w/c seat height. Ambulation 2 x 35 ft with RW and min A for balance, antalgic gait pattern with decreased stance time on RLE. Pt reports pain in RLE in weight bearing position. Pt left seated in recliner in room with needs in reach at end of session.  Mobility Bed Mobility Bed Mobility: Rolling Right;Rolling Left;Supine to Sit;Sit to Supine Rolling Right: Supervision/verbal cueing Rolling Left: Supervision/Verbal cueing Supine to Sit: Contact Guard/Touching assist Sit to Supine: Contact Guard/Touching assist Transfers Transfers: Sit to Stand;Stand to Sit;Stand Pivot Transfers Sit to Stand: Minimal Assistance - Patient > 75% Stand to Sit: Minimal Assistance - Patient > 75% Stand Pivot Transfers: Minimal Assistance - Patient > 75% Stand Pivot Transfer Details: Verbal cues for safe use of DME/AE;Verbal cues for technique;Verbal cues for precautions/safety;Verbal cues for sequencing Transfer (Assistive device): Rolling walker Locomotion  Gait Gait Distance (Feet): 35 Feet Assistive device: Rolling walker Gait Gait Pattern: Impaired (antalgic, decreased stance time on RLE) Gait velocity: decreased Stairs / Additional Locomotion Stairs: No Wheelchair Mobility Wheelchair Mobility: No   Discharge Criteria: Patient will be discharged from PT if patient refuses treatment 3 consecutive times without medical reason, if treatment goals not met, if there is a change in medical status, if patient makes no progress towards goals or if patient is discharged from hospital.  The above assessment, treatment plan, treatment alternatives and goals were discussed and mutually agreed upon: by patient   Excell Seltzer, PT, DPT, CSRS  01/25/2021, 3:15 PM

## 2021-01-25 NOTE — Plan of Care (Signed)
  Problem: RH Balance Goal: LTG Patient will maintain dynamic standing balance (PT) Description: LTG:  Patient will maintain dynamic standing balance with assistance during mobility activities (PT) Flowsheets (Taken 01/25/2021 1634) LTG: Pt will maintain dynamic standing balance during mobility activities with:: Independent with assistive device    Problem: Sit to Stand Goal: LTG:  Patient will perform sit to stand with assistance level (PT) Description: LTG:  Patient will perform sit to stand with assistance level (PT) Flowsheets (Taken 01/25/2021 1634) LTG: PT will perform sit to stand in preparation for functional mobility with assistance level: Independent with assistive device   Problem: RH Bed Mobility Goal: LTG Patient will perform bed mobility with assist (PT) Description: LTG: Patient will perform bed mobility with assistance, with/without cues (PT). Flowsheets (Taken 01/25/2021 1634) LTG: Pt will perform bed mobility with assistance level of: Independent with assistive device    Problem: RH Bed to Chair Transfers Goal: LTG Patient will perform bed/chair transfers w/assist (PT) Description: LTG: Patient will perform bed to chair transfers with assistance (PT). Flowsheets (Taken 01/25/2021 1634) LTG: Pt will perform Bed to Chair Transfers with assistance level: Independent with assistive device    Problem: RH Car Transfers Goal: LTG Patient will perform car transfers with assist (PT) Description: LTG: Patient will perform car transfers with assistance (PT). Flowsheets (Taken 01/25/2021 1634) LTG: Pt will perform car transfers with assist:: Independent with assistive device    Problem: RH Ambulation Goal: LTG Patient will ambulate in controlled environment (PT) Description: LTG: Patient will ambulate in a controlled environment, # of feet with assistance (PT). Flowsheets (Taken 01/25/2021 1634) LTG: Pt will ambulate in controlled environ  assist needed:: Independent with  assistive device LTG: Ambulation distance in controlled environment: 150 ft with LRAD Goal: LTG Patient will ambulate in home environment (PT) Description: LTG: Patient will ambulate in home environment, # of feet with assistance (PT). Flowsheets (Taken 01/25/2021 1634) LTG: Pt will ambulate in home environ  assist needed:: Independent with assistive device LTG: Ambulation distance in home environment: 75 ft with LRAD   Problem: RH Stairs Goal: LTG Patient will ambulate up and down stairs w/assist (PT) Description: LTG: Patient will ambulate up and down # of stairs with assistance (PT) Flowsheets (Taken 01/25/2021 1634) LTG: Pt will ambulate up/down stairs assist needed:: Supervision/Verbal cueing LTG: Pt will  ambulate up and down number of stairs: 2 stairs with L handrail

## 2021-01-25 NOTE — Progress Notes (Signed)
Inpatient Rehabilitation Care Coordinator Assessment and Plan Patient Details  Name: Shelby Walker MRN: [id number] Date of Birth: 30-May-1933  Today's Date: 01/25/2021  Hospital Problems: Principal Problem:   S/P transmetatarsal amputation of foot, right [facility])  Past Medical History:  Past Medical History:  Diagnosis Date   Bruit    Carotid Doppler showed no significant abnormality     9per patient)   C. difficile colitis 07/2018   with severe sepsis   Chest pain, unspecified    Nuclear, May, 2008, no scar or ischemia   Diabetes mellitus    Diverticulosis    Dyslipidemia    Ejection fraction    EF 55-60%, echo, February, 2011 // Echocardiogram 8/21: EF 60-65, no RWMA, Gr 1 DD, GLS -14%, normal RVSF, mild LAE, trivial MR, mild MS (mean 4 mmHg), RVSP 23.4    GERD (gastroesophageal reflux disease)    Mitral regurgitation April 21, 2009   mild,  echo, February, 2011   Osteoporosis    Palpitations    possible very brief atrial fibrillation on monitor and possible reentrant tachycardia   Psoriasis    Rheumatoid arthritis ([place])    Past Surgical History:  Past Surgical History:  Procedure Laterality Date   ABDOMINAL AORTOGRAM W/LOWER EXTREMITY Right 10/19/2020   Procedure: ABDOMINAL AORTOGRAM W/LOWER EXTREMITY;  Surgeon: Wellington Hampshire, MD;  Location: [place] CV [facility];  Service: Cardiovascular;  Laterality: Right;   FRACTURE SURGERY     TRANSMETATARSAL AMPUTATION Right 01/18/2021   Procedure: TRANSMETATARSAL AMPUTATION;  Surgeon: Wylene Simmer, MD;  Location: [place];  Service: Orthopedics;  Laterality: Right;   Social History:  reports that she has never smoked. She has never used smokeless tobacco. She reports that she does not drink alcohol and does not use drugs.  Family / Support Systems Marital Status: Widow/Widower Patient Roles: Parent Children: Gertie Baron [phone]  Dean-son [phone] Other Supports: Extended family and church members Anticipated  Caregiver: Both children-daughter and son Ability/Limitations of Caregiver: Both work but are very close son-two houses away and duagher 5 minutes from Caregiver Availability: Intermittent Family Dynamics: Close knit family who assist one another, family provides meals and cleaning for pt. Pt was mobile prior to admission with her walker  Social History Preferred language: English Religion: [employer] Cultural Background: No issues Education: [place] - How often do you need to have someone help you when you read instructions, pamphlets, or other written material from your doctor or pharmacy?: Never Writes: Yes Employment Status: Retired Public relations account executive Issues: No issues Guardian/Conservator: None-according to MD pt is capable of making her own decisions while here   Abuse/Neglect Abuse/Neglect Assessment Can Be Completed: Yes Physical Abuse: Denies Verbal Abuse: Denies Sexual Abuse: Denies Exploitation of patient/patient's resources: Denies Self-Neglect: Denies  Patient response to: Social Isolation - How often do you feel lonely or isolated from those around you?: Sometimes  Emotional Status Pt's affect, behavior and adjustment status: Pt is motivated to get stronger and back to her mod/i level. She does not want to burden her children and wants to be able to stay in her own home. She will do her best here and feels better after her am therapy sessions today. Recent Psychosocial Issues: other health issues Psychiatric History: No history seems to be coping appropriately and cheerful and happy to be here on rehab Substance Abuse History: No issues  Patient / Family Perceptions, Expectations & Goals Pt/Family understanding of illness & functional limitations: Pt and son can explain her health issues  and amputations, she talks with the MD and feels she understands her treatment plan moving forward. She hopes to heal and get her balance back while here Premorbid  pt/family roles/activities: Mom, grandmother, retire, church member, Health visitor, etc Anticipated changes in roles/activities/participation: resume Pt/family expectations/goals: Pt states: " I know I have some work to do and plan to try hard here."  Son states: " We hope she does well and can get back to using her walker at discharge."  [employer]: None Premorbid Home Care/DME Agencies: Other (Comment) (has rw with tray) Transportation available at discharge: Family members Is the patient able to respond to transportation needs?: Yes In the past 12 months, has lack of transportation kept you from medical appointments or from getting medications?: No In the past 12 months, has lack of transportation kept you from meetings, work, or from getting things needed for daily living?: No  Discharge Planning Living Arrangements: Alone Support Systems: Children, Other relatives, Friends/neighbors, Social worker community Type of Residence: Private residence Insurance Resources: [employer], Multimedia programmer (specify) ([employer]) Museum/gallery curator Resources: Radio broadcast assistant Screen Referred: No Living Expenses: Own Money Management: Patient, Family Does the patient have any problems obtaining your medications?: No Home Management: family members provide meals and home management Patient/Family Preliminary Plans: Return back to her home and have her children provide the same assist they did. She is hopeful she will be mod/i with her rolling walker like she ws before. Will await therapy evaluations and work on discharge needs. Care Coordinator Anticipated Follow Up Needs: HH/OP  Clinical Impression Pleasant female who is motivated to do well here on rehab. Her children are involved and will assist her but can not provide 24/7 care. Will await therapy evaluations and work on safe discharge plan.  Elease Hashimoto 01/25/2021, 10:59 AM

## 2021-01-26 DIAGNOSIS — Z89431 Acquired absence of right foot: Secondary | ICD-10-CM | POA: Diagnosis not present

## 2021-01-26 LAB — GLUCOSE, CAPILLARY
Glucose-Capillary: 116 mg/dL — ABNORMAL HIGH (ref 70–99)
Glucose-Capillary: 188 mg/dL — ABNORMAL HIGH (ref 70–99)
Glucose-Capillary: 268 mg/dL — ABNORMAL HIGH (ref 70–99)
Glucose-Capillary: 108 mg/dL — ABNORMAL HIGH (ref 70–99)

## 2021-01-26 NOTE — Progress Notes (Signed)
PROGRESS NOTE   Subjective/Complaints:  Pt reports cannot get comfortable due to pain pain due to her arthritis.  Also, got bowels "straightened" out- LBM yesterday  Asked for IV removal- since not being used.   L shoulder still bothersome.    ROS: Pt denies SOB, abd pain, CP, N/V/C/D, and vision changes  Objective:   No results found. Recent Labs    01/25/21 0503  WBC 8.2  HGB 8.6*  HCT 26.4*  PLT 240   Recent Labs    01/25/21 0503  NA 140  K 4.8  CL 107  CO2 28  GLUCOSE 148*  BUN 41*  CREATININE 1.15*  CALCIUM 8.5*    Intake/Output Summary (Last 24 hours) at 01/26/2021 0844 Last data filed at 01/26/2021 0750 Gross per 24 hour  Intake 360 ml  Output --  Net 360 ml        Physical Exam: Vital Signs Blood pressure 127/62, pulse 76, temperature 98.7 F (37.1 C), resp. rate 20, height 5\' 1"  (1.549 m), weight 56.9 kg, SpO2 99 %.     General: awake, alert, appropriate, laying supine in bed; trying to turn some; NAD HENT: conjugate gaze; oropharynx moist CV: regular rate; no JVD Pulmonary: CTA B/L; no W/R/R- good air movement GI: soft, NT, ND, (+)BS Psychiatric: appropriate; HOH Neurological: Ox3 Musculoskeletal:     Cervical back: Normal range of motion. No rigidity.     Comments: UE strength 5-/5- however has severe RA chronic changes/ulnar deviation in hands B/L with B/L Hand atrophy  RLE- HF/KE/KF 5-/5; NT DF/PF due to CAM boot/pain and TMA LLE- 5-/5  R TMA dressed in ACE wrap; with CAM boot- mild ot moderate swelling- per surgeon to not take off surgical dressing til 2+ weeks when they will remove staples.   Skin:    Comments: Ecchymoses in UE B/L and LLE notable IV L forearm- looks OK No skin changes on backside seen- blanchable redness; bony coccyx L heel slightly boggy- no skin breakdown  Neurological:     Comments: Intact to light touch in UE B/L  LLE- intact to L knee and RLE  intact to knee- decreased below knee B/L   Psychiatric:        Behavior: Behavior normal.     Comments: HOH    Assessment/Plan: 1. Functional deficits which require 3+ hours per day of interdisciplinary therapy in a comprehensive inpatient rehab setting. Physiatrist is providing close team supervision and 24 hour management of active medical problems listed below. Physiatrist and rehab team continue to assess barriers to discharge/monitor patient progress toward functional and medical goals  Care Tool:  Bathing    Body parts bathed by patient: Right arm, Left arm, Chest, Abdomen, Front perineal area, Right upper leg, Left upper leg, Face   Body parts bathed by helper: Left lower leg, Buttocks Body parts n/a: Right lower leg   Bathing assist Assist Level: Minimal Assistance - Patient > 75%     Upper Body Dressing/Undressing Upper body dressing   What is the patient wearing?: Bra, Pull over shirt    Upper body assist Assist Level: Minimal Assistance - Patient > 75%    Lower Body Dressing/Undressing  Lower body dressing      What is the patient wearing?: Pants     Lower body assist Assist for lower body dressing: Minimal Assistance - Patient > 75%     Toileting Toileting    Toileting assist Assist for toileting: Minimal Assistance - Patient > 75%     Transfers Chair/bed transfer  Transfers assist     Chair/bed transfer assist level: Contact Guard/Touching assist     Locomotion Ambulation   Ambulation assist      Assist level: Minimal Assistance - Patient > 75% Assistive device: Walker-rolling Max distance: 35'   Walk 10 feet activity   Assist     Assist level: Minimal Assistance - Patient > 75% Assistive device: Walker-rolling   Walk 50 feet activity   Assist Walk 50 feet with 2 turns activity did not occur: Safety/medical concerns         Walk 150 feet activity   Assist Walk 150 feet activity did not occur: Safety/medical  concerns         Walk 10 feet on uneven surface  activity   Assist Walk 10 feet on uneven surfaces activity did not occur: Safety/medical concerns         Wheelchair     Assist Is the patient using a wheelchair?: No Type of Wheelchair: Manual    Wheelchair assist level: Dependent - Patient 0% Max wheelchair distance: 150'    Wheelchair 50 feet with 2 turns activity    Assist        Assist Level: Dependent - Patient 0%   Wheelchair 150 feet activity     Assist      Assist Level: Dependent - Patient 0%   Blood pressure 127/62, pulse 76, temperature 98.7 F (37.1 C), resp. rate 20, height 5\' 1"  (1.549 m), weight 56.9 kg, SpO2 99 %.  Medical Problem List and Plan: 1. 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  Antithrombotics: -DVT/anticoagulation:  Pharmaceutical: Other (comment)--on Eliquis             -antiplatelet therapy: N/a 3. Pain Management: Using tramadol qid prn with occasional oxycodone prn. Also has voltaren gel and lidoderm for L shoulder pain  11/24- pain controlled when takes meds- encouraged her to ask for them- con't regimen             4. Mood: LCSW to follow for evaluation and support.              -antipsychotic agents: N/A 5. Neuropsych: This patient is capable of making decisions on her own behalf. 6. Skin/Wound Care: Monitor wound for healing. Has low protein stores--alb-2.4             --add Juven to supplement low calorie malnutrition             --contacted Dr. Doran Durand re: dressing change/orders. Was informed dressing needs to stay on till follow up in office. Per Ortho surgeon 7. Fluids/Electrolytes/Nutrition: Monitor I/O. Check CMET in am.  8. A fib: Monitor HR TID-- on Coreg BID w/Eliquis 9. T2DM: Hgb A1C-  continue insulin Glargine with SSI.   11/24-  BG good this AM, but in 200s yesterday- will monitor for trend and change  insulin tomorrow if needed 10. Acute blood loss anemia: Recheck CBC in am.  11. Chemo/Diabetes induced peripheral neuropathy: Managed with Neurontin  12. RA: On prednisone with monthly infusion of DMARD.  13. Right knee pain: Will add Voltaren gel qid to knee. 14. Left bicipital tendinitis: Continue lidocaine patch to shoulder. If pain not better by Friday, will do steroid injection as long as BG's controlled  11/24- can try Steroid inj tomorrow, but can cause BG's to go up for 4-5 days. 15. Dispo 11/24- will d/c IV       LOS: 2 days A FACE TO FACE EVALUATION WAS PERFORMED  Shelby Walker 01/26/2021, 8:44 AM

## 2021-01-27 DIAGNOSIS — Z89431 Acquired absence of right foot: Secondary | ICD-10-CM | POA: Diagnosis not present

## 2021-01-27 LAB — GLUCOSE, CAPILLARY
Glucose-Capillary: 117 mg/dL — ABNORMAL HIGH (ref 70–99)
Glucose-Capillary: 368 mg/dL — ABNORMAL HIGH (ref 70–99)
Glucose-Capillary: 181 mg/dL — ABNORMAL HIGH (ref 70–99)
Glucose-Capillary: 159 mg/dL — ABNORMAL HIGH (ref 70–99)

## 2021-01-27 NOTE — Progress Notes (Signed)
Occupational Therapy Session Note  Patient Details  Name: Shelby Walker MRN: [id number] Date of Birth: 1933/06/02  Today's Date: 01/27/2021 OT Individual Time: 1683-7290 OT Individual Time Calculation (min): 72 min    Short Term Goals: Week 1:  OT Short Term Goal 1 (Week 1): STGs = LTGs 2/2 LOS at Mod I/Set up   Skilled Therapeutic Interventions/Progress Updates:    Pt greeted at time of session sitting up in recliner agreeable to OT session, no pain at rest. Pt does have some RLE discomfort in standing and with mobility but did not rate and able to participate. Stand pivot transfers recliner > wheelchair > various surfaces throughout session all with CGA and RW. Initial part of session on patient training and education for donning/doffing CAM boot. Provided supervision throughout except for last two distal straps pt needing assist. Discussed environments and positions best for donning boot at home. Wheelchair transport to ADL apartment and focused on patient training and education for kitchen/home set up at RW level. Note pt already has walker tray and uses at home. Simulated IADL for simple meal prep and pt able to do so with Supervision, accessing fridge and microwave from home level. Walk in shower transfers performed x1 with posterior entry method and pt also performing with CGA/Min. Extensive discussion throughout session regarding home set up and accessibility. Provided home measurements sheet for daughter [name] to fill out, provided in patient room. Pt stand pivot wheelchair > bed with CGA and RW. Alarm on call bell in reach.   Therapy Documentation Precautions:  Precautions Precautions: Fall Required Braces or Orthoses: Other Brace Other Brace: WBAT RLE with CAM boot Restrictions Weight Bearing Restrictions: Yes RLE Weight Bearing: Weight bearing as tolerated Other Position/Activity Restrictions: CAM boot when OOB and when WB RLE     Therapy/Group: Individual Therapy  Viona Gilmore 01/27/2021, 7:20 AM

## 2021-01-27 NOTE — IPOC Note (Signed)
Overall Plan of Care [facility]) Patient Details Name: Shelby Walker MRN: [id number] DOB: 04/30/1933  Admitting Diagnosis: S/P transmetatarsal amputation of foot, right [facility])  Hospital Problems: Principal Problem:   S/P transmetatarsal amputation of foot, right ([facility])     Functional Problem List: Nursing Endurance, Edema, Medication Management, Pain, Safety, Skin Integrity  PT Balance, Endurance, Pain, Safety  OT Balance, Endurance, Motor, Pain, Safety  SLP    TR         Basic ADL's: OT Grooming, Bathing, Dressing, Toileting     Advanced  ADL's: OT Simple Meal Preparation     Transfers: PT Bed Mobility, Bed to Chair, Car, Furniture, Floor  OT Toilet, Metallurgist: PT Ambulation, Emergency planning/management officer, Stairs     Additional Impairments: OT None  SLP        TR      Anticipated Outcomes Item Anticipated Outcome  Self Feeding Independent  Swallowing      Basic self-care  Supervision bathe, Set up/Mod I all else  Toileting  Mod I   Bathroom Transfers Mod I  Bowel/Bladder  n/a  Transfers  mod I  Locomotion  mod I with LRAD at ambulatory level  Communication     Cognition     Pain  < 3  Safety/Judgment  supervision and no falls   Therapy Plan: PT Intensity: Minimum of 1-2 x/day ,45 to 90 minutes PT Frequency: 5 out of 7 days PT Duration Estimated Length of Stay: 7-10 days OT Intensity: Minimum of 1-2 x/day, 45 to 90 minutes OT Frequency: 5 out of 7 days OT Duration/Estimated Length of Stay: 7-10 days     Due to the current state of emergency, patients may not be receiving their 3-hours of Medicare-mandated therapy.   Team Interventions: Nursing Interventions Patient/Family Education, Disease Management/Prevention, Pain Management, Medication Management, Skin Care/Wound Management, Discharge Planning  PT interventions Ambulation/gait training, Balance/vestibular training, Community reintegration, Discharge planning, DME/adaptive equipment  instruction, Functional mobility training, Pain management, Patient/family education, Stair training, Therapeutic Activities, UE/LE Strength taining/ROM, UE/LE Coordination activities, Therapeutic Exercise  OT Interventions Balance/vestibular training, Discharge planning, Pain management, Self Care/advanced ADL retraining, Therapeutic Activities, UE/LE Coordination activities, Visual/perceptual remediation/compensation, Therapeutic Exercise, Skin care/wound managment, Patient/family education, Functional mobility training, Disease mangement/prevention, Cognitive remediation/compensation, Community reintegration, Engineer, drilling, Neuromuscular re-education, Psychosocial support, Splinting/orthotics, UE/LE Strength taining/ROM, Wheelchair propulsion/positioning  SLP Interventions    TR Interventions    SW/CM Interventions Discharge Planning, Psychosocial Support, Patient/Family Education   Barriers to Discharge MD  Medical stability, Home enviroment access/loayout, Wound care, and Weight bearing restrictions  Nursing Decreased caregiver support, Wound Care, Lack of/limited family support, Weight bearing restrictions, Medication compliance, Home environment access/layout Discharging to 2 level home with 1 step to enter. Left rail, Able to live on main level with bedroom/bathroom. Daughter and Son will be available 24/7 at discharge.  PT Decreased caregiver support, Home environment access/layout, Weight bearing restrictions    OT      SLP      SW       Team Discharge Planning: Destination: PT-Home ,OT- Home , SLP-  Projected Follow-up: PT-Home health PT, OT-  Home health OT, SLP-  Projected Equipment Needs: PT-To be determined, OT- To be determined, SLP-  Equipment Details: PT-pt already owns SPC and RW, OT-  Patient/family involved in discharge planning: PT- Patient,  OT-Patient, SLP-   MD ELOS: 7-10 days Medical Rehab Prognosis:  Good Assessment: Pt is an 87 yr old  female with RA, and  Afib,  DM with uncontrolled BG's; as well as ABLA- with R TMA- cannot remove dressing - per surgeon.   Goals mod I to supervision.    See Team Conference Notes for weekly updates to the plan of care

## 2021-01-27 NOTE — Progress Notes (Signed)
Physical Therapy Session Note  Patient Details  Name: Shelby Walker MRN: [id number] Date of Birth: 1933/12/14  Today's Date: 01/27/2021 PT Individual Time: 0805-0900 and 1300-1430 PT Individual Time Calculation (min): 55 min and 90 min  Short Term Goals: Week 1:  PT Short Term Goal 1 (Week 1): =LTG due to ELOS  Skilled Therapeutic Interventions/Progress Updates: Pt presented in bed agreeable to therapy. Pt states pain 5/10 at rest 10/10 with standing activity. PTA donned CAM boot total A for time management. Pt performed supine to sit from flat bed with supervision and donned sneaker with set up. Pt performed ambulatory transfer to w/c with CGA. Pt transported to day room for time management and participated in gait training with RW 43ft CGA. Pt noted to push through with BUE to offload RLE for pain management. Pt also noted to ambulate with mostly step to pattern with intermittent step through pattern. Pt then participated in seated LE therex including LAQ with 2.5 lb cuff 2 x 10, seated march with BLE 2 x 10, and hip abd with LLE 2 x 10. Pt then performed another bout of ambulation with improved step through pattern 24ft with w/c follow. Pt transported back to room and performed ambulatory transfer to recliner with CGA. Pt left in recliner at end of session with call bell within reach and needs met.    Tx2: Pt presented in bed agreeable to therapy with daughter and son in law present. Pt states pain 6/10, after extensive discussion with nsg and pt deferred intervention during session but RLE elevated and rest breaks provided as needed. Pt performed supine to sit at EOB with supervision and use of bed features. Performed ambulatory transfer to w/c with RW and CGA. Dgt requesting to observe therapy session. Pt transported to rehab gym total A for energy conservation. Performed ambulatory transfer to high/low mat. PTA set up 4in step for toe taps/weight shifting however once pt started activity pt stepped  onto block and stepped down moving forward. Due to ease that pt performed this activity changed focus to step ups in preparation for home entry. Daughter then explained that pt has 1 step then a second "pseudo-step" which is the threshold. Previously pt would pull up with rail at wall then grab inside of doorframe and pivot sideways into home. Discussed that this may not be safest practice and may be contributing to shoulder pain. Daughter to take pictures of set up but discussed that initially pt will not be getting in/out of house alone therefore may be safest to have a second person place RW at doorframe have pt using wall rail to pull up to first step (as pt had been doing previously) then stepping over elevated threshold using RW. PTA created set up for stepping over threshold (short "curb" step with red balance board on top). Pt was able to bring RW to step, PTA assisted with moving RW over balance board and pt was able to step over balance board and step up with CGA. Pt was able to repeat this x 3. After seated rest pt then ambulated ~53ft toward day room. Pt transported remaining distance and participated in NuStep x 8 minutes for cardiovascular endurance. Pt was able to maintain between 30-40 SPM with x 1 brief rest break due to fatigue. Pt transported back to room and performed ambulatory transfer to recliner. Pt left in recliner at end of session with call bell within reach and family present.      Therapy Documentation Precautions:  Precautions Precautions: Fall Required Braces or Orthoses: Other Brace Other Brace: WBAT RLE with CAM boot Restrictions Weight Bearing Restrictions: Yes RLE Weight Bearing: Weight bearing as tolerated Other Position/Activity Restrictions: CAM boot when OOB and when WB RLE General:   Vital Signs: Therapy Vitals Temp: 98.5 F (36.9 C) Pulse Rate: 73 Resp: 15 BP: (!) 102/44 Patient Position (if appropriate): Sitting Oxygen Therapy SpO2: 96 % O2 Device:  Room Air Pain:   Mobility:   Locomotion :    Trunk/Postural Assessment :    Balance:   Exercises:   Other Treatments:      Therapy/Group: Individual Therapy  Wolfgang Finigan 01/27/2021, 3:58 PM

## 2021-01-27 NOTE — Progress Notes (Signed)
PROGRESS NOTE   Subjective/Complaints:   Pt reports slept OK.   Pain tolerable- but would like ot get AM pain meds.  Bowels working well per pt.   L shoulder slightly better.   ROS:  Pt denies SOB, abd pain, CP, N/V/C/D, and vision changes   Objective:   No results found. Recent Labs    01/25/21 0503  WBC 8.2  HGB 8.6*  HCT 26.4*  PLT 240   Recent Labs    01/25/21 0503  NA 140  K 4.8  CL 107  CO2 28  GLUCOSE 148*  BUN 41*  CREATININE 1.15*  CALCIUM 8.5*    Intake/Output Summary (Last 24 hours) at 01/27/2021 1112 Last data filed at 01/27/2021 0755 Gross per 24 hour  Intake 720 ml  Output 1 ml  Net 719 ml        Physical Exam: Vital Signs Blood pressure (!) 132/45, pulse 71, temperature 98.8 F (37.1 C), resp. rate 16, height 5\' 1"  (1.549 m), weight 56.9 kg, SpO2 98 %.      General: awake, alert, appropriate, sitting EOB working on getting pants on by self/with NT; NAD; frail appearing HENT: conjugate gaze; oropharynx moist CV: regular rate; no JVD Pulmonary: CTA B/L; no W/R/R- good air movement GI: soft, NT, ND, (+)BS Psychiatric: appropriate- interactive Neurological: Ox3 Skin:- R TMA with ACE wrap over dressing-  Musculoskeletal:     Cervical back: Normal range of motion. No rigidity.     Comments: UE strength 5-/5- however has severe RA chronic changes/ulnar deviation in hands B/L with B/L Hand atrophy  RLE- HF/KE/KF 5-/5; NT DF/PF due to CAM boot/pain and TMA LLE- 5-/5  R TMA dressed in ACE wrap; with CAM boot- mild ot moderate swelling- per surgeon to not take off surgical dressing til 2+ weeks when they will remove staples.   Skin:    Comments: Ecchymoses in UE B/L and LLE notable IV L forearm- looks OK No skin changes on backside seen- blanchable redness; bony coccyx L heel slightly boggy- no skin breakdown  Neurological:     Comments: Intact to light touch in UE B/L  LLE-  intact to L knee and RLE intact to knee- decreased below knee B/L   Psychiatric:        Behavior: Behavior normal.     Comments: HOH    Assessment/Plan: 1. Functional deficits which require 3+ hours per day of interdisciplinary therapy in a comprehensive inpatient rehab setting. Physiatrist is providing close team supervision and 24 hour management of active medical problems listed below. Physiatrist and rehab team continue to assess barriers to discharge/monitor patient progress toward functional and medical goals  Care Tool:  Bathing    Body parts bathed by patient: Right arm, Left arm, Chest, Abdomen, Front perineal area, Right upper leg, Left upper leg, Face   Body parts bathed by helper: Left lower leg, Buttocks Body parts n/a: Right lower leg   Bathing assist Assist Level: Minimal Assistance - Patient > 75%     Upper Body Dressing/Undressing Upper body dressing   What is the patient wearing?: Bra, Pull over shirt    Upper body assist Assist Level: Minimal Assistance -  Patient > 75%    Lower Body Dressing/Undressing Lower body dressing      What is the patient wearing?: Pants     Lower body assist Assist for lower body dressing: Minimal Assistance - Patient > 75%     Toileting Toileting    Toileting assist Assist for toileting: Minimal Assistance - Patient > 75%     Transfers Chair/bed transfer  Transfers assist     Chair/bed transfer assist level: Contact Guard/Touching assist     Locomotion Ambulation   Ambulation assist      Assist level: Minimal Assistance - Patient > 75% Assistive device: Walker-rolling Max distance: 35'   Walk 10 feet activity   Assist     Assist level: Minimal Assistance - Patient > 75% Assistive device: Walker-rolling   Walk 50 feet activity   Assist Walk 50 feet with 2 turns activity did not occur: Safety/medical concerns         Walk 150 feet activity   Assist Walk 150 feet activity did not occur:  Safety/medical concerns         Walk 10 feet on uneven surface  activity   Assist Walk 10 feet on uneven surfaces activity did not occur: Safety/medical concerns         Wheelchair     Assist Is the patient using a wheelchair?: No Type of Wheelchair: Manual    Wheelchair assist level: Dependent - Patient 0% Max wheelchair distance: 150'    Wheelchair 50 feet with 2 turns activity    Assist        Assist Level: Dependent - Patient 0%   Wheelchair 150 feet activity     Assist      Assist Level: Dependent - Patient 0%   Blood pressure (!) 132/45, pulse 71, temperature 98.8 F (37.1 C), resp. rate 16, height 5\' 1"  (1.549 m), weight 56.9 kg, SpO2 98 %.  Medical Problem List and Plan: 1. 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  Antithrombotics: -DVT/anticoagulation:  Pharmaceutical: Other (comment)--on Eliquis             -antiplatelet therapy: N/a 3. Pain Management: Using tramadol qid prn with occasional oxycodone prn. Also has voltaren gel and lidoderm for L shoulder pain  11/24- pain controlled when takes meds- encouraged her to ask for them- con't regimen  11/25- will look at doing steroid injection next Wednesday if need be; won't do today because want to see if will get better- since is diabetic- trying to avoid elevated BG's.              4. Mood: LCSW to follow for evaluation and support.              -antipsychotic agents: N/A 5. Neuropsych: This patient is capable of making decisions on her own behalf. 6. Skin/Wound Care: Monitor wound for healing. Has low protein stores--alb-2.4             --add Juven to supplement low calorie malnutrition             --contacted Dr. Doran Durand re: dressing change/orders. Was informed dressing needs to stay on till follow up in office. Per  Ortho surgeon 7. Fluids/Electrolytes/Nutrition:  Monitor I/O. Check CMET in am.  8. A fib: Monitor HR TID-- on Coreg BID w/Eliquis  11/25- rate controlled- con't regimen9. T2DM: Hgb A1C-  continue insulin Glargine with SSI.   11/24- BG good this AM, but in 200s yesterday- will monitor for trend and change insulin tomorrow if needed  11/25- BG's 108-188- with 1 episode of 268- will monitor for trend, only having 1 spike today- better than yesterday.  10. Acute blood loss anemia: Recheck CBC in am.  11. Chemo/Diabetes induced peripheral neuropathy: Managed with Neurontin  12. RA: On prednisone with monthly infusion of DMARD.  13. Right knee pain: Will add Voltaren gel qid to knee. 14. Left bicipital tendinitis: Continue lidocaine patch to shoulder. If pain not better by Friday, will do steroid injection as long as BG's controlled  11/24- can try Steroid inj tomorrow, but can cause BG's to go up for 4-5 days.  11/25 - since still  having spikes, will wait on steroid injection- for now 15. Dispo 11/24- will d/c IV       LOS: 3 days A FACE TO FACE EVALUATION WAS PERFORMED  Jowana Thumma 01/27/2021, 11:12 AM

## 2021-01-28 DIAGNOSIS — Z89431 Acquired absence of right foot: Secondary | ICD-10-CM | POA: Diagnosis not present

## 2021-01-28 LAB — GLUCOSE, CAPILLARY
Glucose-Capillary: 159 mg/dL — ABNORMAL HIGH (ref 70–99)
Glucose-Capillary: 178 mg/dL — ABNORMAL HIGH (ref 70–99)
Glucose-Capillary: 239 mg/dL — ABNORMAL HIGH (ref 70–99)
Glucose-Capillary: 192 mg/dL — ABNORMAL HIGH (ref 70–99)

## 2021-01-28 MED ORDER — ACETAMINOPHEN-CODEINE #3 300-30 MG PO TABS
1.0000 | ORAL_TABLET | Freq: Three times a day (TID) | ORAL | Status: DC
  Administered 2021-01-28 – 2021-02-06 (×27): 1 via ORAL
  Filled 2021-01-28 (×27): qty 1

## 2021-01-28 MED ORDER — GABAPENTIN 100 MG PO CAPS
100.0000 mg | ORAL_CAPSULE | Freq: Three times a day (TID) | ORAL | Status: DC
  Administered 2021-01-28 – 2021-02-06 (×26): 100 mg via ORAL
  Filled 2021-01-28 (×27): qty 1

## 2021-01-28 MED ORDER — GERHARDT'S BUTT CREAM
1.0000 "application " | TOPICAL_CREAM | Freq: Three times a day (TID) | CUTANEOUS | Status: DC
  Administered 2021-01-28 – 2021-02-06 (×22): 1 via TOPICAL
  Filled 2021-01-28: qty 1

## 2021-01-28 MED ORDER — NITROGLYCERIN 0.4 MG SL SUBL
0.4000 mg | SUBLINGUAL_TABLET | SUBLINGUAL | Status: DC | PRN
  Administered 2021-02-03: 0.4 mg via SUBLINGUAL
  Filled 2021-01-28: qty 1

## 2021-01-28 NOTE — Progress Notes (Signed)
Physical Therapy Session Note  Patient Details  Name: Shelby Walker MRN: [id number] Date of Birth: Sep 13, 1933  Today's Date: 01/28/2021 PT Individual Time: 1000-1053 PT Individual Time Calculation (min): 53 min   Short Term Goals: Week 1:  PT Short Term Goal 1 (Week 1): =LTG due to ELOS  Skilled Therapeutic Interventions/Progress Updates:    Pt received sitting in recliner and agreeable to therapy session. Pt already wearing CAM boot on RLE. Sit<>stands using RW with CGA throughout session. Short distance ~41ft ambulatory transfer recliner>w/c using RW with CGA.  Transported to/from gym in w/c for time management and energy conservation. Pt reports her main goal is to walk. Gait training 1ft +55ft using RW with CGA for safety - demos primarily step-to pattern leading with R LE, with intermittent step through pattern - pt reports 8-9/10 pain on 1st gait trial and then 7/10 on 2nd with cuing to primarily put weight through R heel - pt also appears to use B UE support on RW to off-weight R LE during stance. Educated pt on listening to her pain levels and need for wound healing prior to aggressive weightbearing activities. B LE reciprocal movement pattern strengthening on Nustep against level 3 resistance for 42min30sec achieving 200steps - transitioned to B UE and B LE movements at 87min due to LEs fatiguing. Repeated sit<>stands to/from w/c<>RW 2x10 reps with CGA/close uspervision - cuing for increased hip extensor activation rising to stand and sooner knee flexion on descent to decrease the "plop" pt typically does going to sit. Transported back to room and pt requesting to return to bed to rest. Short distance ~2ft ambulatory transfer w/c>EOB using RW with CGA. Sit>supine with supervision using bedrails. Pt left supine in bed with needs in reach and bed alarm on.  Therapy Documentation Precautions:  Precautions Precautions: Fall Required Braces or Orthoses: Other Brace Other Brace: WBAT RLE with CAM  boot Restrictions Weight Bearing Restrictions: Yes RLE Weight Bearing: Weight bearing as tolerated Other Position/Activity Restrictions: CAM boot when OOB and when WB RLE   Pain:  Reports pain 8-9/10 with 1st gait trail - nurse notified and present for medication administration - therapist provided seated rest breaks and educated pt on performing Bevier activities to her tolerance and not rushing it.    Therapy/Group: Individual Therapy  Tawana Scale , PT, DPT, NCS, CSRS  01/28/2021, 7:56 AM

## 2021-01-28 NOTE — Progress Notes (Signed)
PROGRESS NOTE   Subjective/Complaints: Daughter says that she is trying to minimize oxycodone but patient benefited from Tylenol #3 in past and also took Gabapentin scheduled before ant tolerated it well.  She had episode of chest pain that resolved on its own ROS:  Pt denies SOB, abd pain, N/V/C/D, and vision changes, +chest pain   Objective:   No results found. No results for input(s): WBC, HGB, HCT, PLT in the last 72 hours.  No results for input(s): NA, K, CL, CO2, GLUCOSE, BUN, CREATININE, CALCIUM in the last 72 hours.   Intake/Output Summary (Last 24 hours) at 01/28/2021 1911 Last data filed at 01/28/2021 1843 Gross per 24 hour  Intake 480 ml  Output --  Net 480 ml        Physical Exam: Vital Signs Blood pressure (!) 142/46, pulse 78, temperature 98.4 F (36.9 C), resp. rate 14, height 5\' 1"  (1.549 m), weight 56.9 kg, SpO2 98 %. Gen: no distress, normal appearing HEENT: oral mucosa pink and moist, NCAT Cardio: Reg rate Chest: normal effort, normal rate of breathing Abd: soft, non-distended Ext: no edema Psych: pleasant, normal affect Skin:- R TMA with ACE wrap over dressing-  Musculoskeletal:     Cervical back: Normal range of motion. No rigidity.     Comments: UE strength 5-/5- however has severe RA chronic changes/ulnar deviation in hands B/L with B/L Hand atrophy  RLE- HF/KE/KF 5-/5; NT DF/PF due to CAM boot/pain and TMA LLE- 5-/5  R TMA dressed in ACE wrap; with CAM boot- mild ot moderate swelling- per surgeon to not take off surgical dressing til 2+ weeks when they will remove staples.   Skin:    Comments: Ecchymoses in UE B/L and LLE notable IV L forearm- looks OK No skin changes on backside seen- blanchable redness; bony coccyx L heel slightly boggy- no skin breakdown  Neurological:     Comments: Intact to light touch in UE B/L  LLE- intact to L knee and RLE intact to knee- decreased below knee  B/L   Psychiatric:        Behavior: Behavior normal.     Comments: HOH    Assessment/Plan: 1. Functional deficits which require 3+ hours per day of interdisciplinary therapy in a comprehensive inpatient rehab setting. Physiatrist is providing close team supervision and 24 hour management of active medical problems listed below. Physiatrist and rehab team continue to assess barriers to discharge/monitor patient progress toward functional and medical goals  Care Tool:  Bathing    Body parts bathed by patient: Right arm, Left arm, Chest, Abdomen, Front perineal area, Right upper leg, Left upper leg, Face, Buttocks, Left lower leg   Body parts bathed by helper: Left lower leg, Buttocks Body parts n/a: Right lower leg   Bathing assist Assist Level: Contact Guard/Touching assist     Upper Body Dressing/Undressing Upper body dressing   What is the patient wearing?: Bra, Pull over shirt    Upper body assist Assist Level: Minimal Assistance - Patient > 75%    Lower Body Dressing/Undressing Lower body dressing      What is the patient wearing?: Pants, Underwear/pull up     Lower body  assist Assist for lower body dressing: Minimal Assistance - Patient > 75%     Toileting Toileting    Toileting assist Assist for toileting: Minimal Assistance - Patient > 75%     Transfers Chair/bed transfer  Transfers assist     Chair/bed transfer assist level: Contact Guard/Touching assist     Locomotion Ambulation   Ambulation assist      Assist level: Minimal Assistance - Patient > 75% Assistive device: Walker-rolling Max distance: 35'   Walk 10 feet activity   Assist     Assist level: Minimal Assistance - Patient > 75% Assistive device: Walker-rolling   Walk 50 feet activity   Assist Walk 50 feet with 2 turns activity did not occur: Safety/medical concerns         Walk 150 feet activity   Assist Walk 150 feet activity did not occur: Safety/medical  concerns         Walk 10 feet on uneven surface  activity   Assist Walk 10 feet on uneven surfaces activity did not occur: Safety/medical concerns         Wheelchair     Assist Is the patient using a wheelchair?: No Type of Wheelchair: Manual    Wheelchair assist level: Dependent - Patient 0% Max wheelchair distance: 150'    Wheelchair 50 feet with 2 turns activity    Assist        Assist Level: Dependent - Patient 0%   Wheelchair 150 feet activity     Assist      Assist Level: Dependent - Patient 0%   Blood pressure (!) 142/46, pulse 78, temperature 98.4 F (36.9 C), resp. rate 14, height 5\' 1"  (1.549 m), weight 56.9 kg, SpO2 98 %.  Medical Problem List and Plan: 1. 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  Antithrombotics: -DVT/anticoagulation:  Pharmaceutical: Other (comment)--on Eliquis             -antiplatelet therapy: N/a 3. Pain Management: Using tramadol qid prn with occasional oxycodone prn. Also has voltaren gel and lidoderm for L shoulder pain  11/24- pain controlled when takes meds- encouraged her to ask for them- con't regimen  11/25- will look at doing steroid injection next Wednesday if need be; won't do today because want to see if will get better- since is diabetic- trying to avoid elevated BG's.   11/26: scheduled gabapentin 100mg  TID. Replaced prn oxycodone with scheduled Tylenol #3 TID as patient took previously.              4. Mood: LCSW to follow for evaluation and support.              -antipsychotic agents: N/A 5. Neuropsych: This patient is capable of making decisions on her own behalf. 6. Skin/Wound Care: Monitor wound for healing. Has low protein stores--alb-2.4             --add Juven to supplement low calorie malnutrition             --contacted  Dr. Doran Durand re: dressing change/orders. Was informed dressing needs to stay on till follow up in office. Per Ortho surgeon 7. Fluids/Electrolytes/Nutrition: Monitor I/O. Check CMET in am.  8. A fib: Monitor  HR TID-- on Coreg BID w/Eliquis  11/25- rate controlled- con't regimen9. T2DM: Hgb A1C-  continue insulin Glargine with SSI.   11/24- BG good this AM, but in 200s yesterday- will monitor for trend and change insulin tomorrow if needed  11/25- BG's 108-188- with 1 episode of 268- will monitor for trend, only having 1 spike today- better than yesterday.  10. Acute blood loss anemia: Recheck CBC in am.  11. Chemo/Diabetes induced peripheral neuropathy: Managed with Neurontin  12. RA: On prednisone with monthly infusion of DMARD.  13. Right knee pain: Will add Voltaren gel qid to knee. 14. Left bicipital tendinitis: Continue lidocaine patch to shoulder. If pain not better by Friday, will do steroid injection as long as BG's controlled  11/24- can try Steroid inj tomorrow, but can cause BG's to go up for 4-5 days.  11/25 - since still  having spikes, will wait on steroid injection- for now 15. Chronic angina: SL nitro ordered PRN.  16. SBP high and DBP low: continued to monitor TID  17. Dispo 11/24- will d/c IV       LOS: 4 days A FACE TO FACE EVALUATION WAS PERFORMED  Martha Clan P Flornce Record 01/28/2021, 7:11 PM

## 2021-01-28 NOTE — Progress Notes (Signed)
Occupational Therapy Session Note  Patient Details  Name: Shelby Walker MRN: [id number] Date of Birth: 18-May-1933  Today's Date: 01/29/2021 OT Individual Time: 1300-1355 OT Individual Time Calculation (min): 55 min    Short Term Goals: Week 1:  OT Short Term Goal 1 (Week 1): STGs = LTGs 2/2 LOS at Mod I/Set up  Skilled Therapeutic Interventions/Progress Updates:    Pt greeted EOB, just finishing up her lunch. She was agreeable to session, declining shower. Pt required cues and Min A to don her Rt CAM boot. Noticed that flexible portion was basically separated from frame, pt noted a change in position of her leg while in the boot. Notified RN to consult with ortho regarding getting her another better fitted boot or smaller offloading shoe. After pt donned her Lt shoe, pt asked if we were leaving the room. She wanted to change clothing when OT said that we would. Pt doffed boot with Min A and then donned new pants, vcs for sequencing to ensure pt was WB through the Rt LE only while wearing CAM boot. Setup for overhead shirt. CGA for ambulatory toilet transfer using RW with pt having +bladder void, CGA for toileting tasks. After handwashing pt reported just not feeling right, a little shaky. Informed RN. Pt was escorted down to the atrium for a bit to cheer her up. Worked on UB strengthening/endurance while she self propelled the w/c in the food court area. Thought that some ROM might help with Lt arm pain however it made it worse. OT assisted with positioning pts w/c in the sunlight for a few minutes before returning her to the room. Informed RN of pts Lt arm pain and that she was still not feeling quite right. Family present at this time. We discussed recommendation of consulting f/u OT in regards to shower transfer method due to pts shower being inaccessible via walker. Pt remained sitting up with all needs within reach.   Therapy Documentation Precautions:  Precautions Precautions: Fall Required  Braces or Orthoses: Other Brace Other Brace: WBAT RLE with CAM boot Restrictions Weight Bearing Restrictions: Yes RLE Weight Bearing: Weight bearing as tolerated Other Position/Activity Restrictions: CAM boot when OOB and when WB RLE  ADL: ADL Eating: Set up Grooming: Setup Upper Body Bathing: Minimal assistance Where Assessed-Upper Body Bathing: Sitting at sink Lower Body Bathing: Minimal assistance Where Assessed-Lower Body Bathing: Sitting at sink Upper Body Dressing: Minimal assistance Where Assessed-Upper Body Dressing: Sitting at sink Lower Body Dressing: Minimal assistance Where Assessed-Lower Body Dressing: Wheelchair Toileting: Minimal assistance Toilet Transfer: Minimal assistance Toilet Transfer Method: Stand pivot  Therapy/Group: Individual Therapy  Rumaldo Difatta A Haidynn Almendarez 01/29/2021, 4:00 PM

## 2021-01-28 NOTE — Progress Notes (Signed)
Occupational Therapy Session Note  Patient Details  Name: Shelby Walker MRN: [id number] Date of Birth: 03-15-1933  Today's Date: 01/28/2021 OT Individual Time: 2536-6440 and 3474-2595 OT Individual Time Calculation (min): 56 min and 58 min   Short Term Goals: Week 1:  OT Short Term Goal 1 (Week 1): STGs = LTGs 2/2 LOS at Mod I/Set up   Skilled Therapeutic Interventions/Progress Updates:    Pt greeted at time of session sitting on EOB ready for OT session, no pain at rest but states she does have some RLE pain in standing later in session, modified session accordingly to primarily be from a seated position. Offered shower at beginning of session, pt politely declined in favor of doing a shower this afternoon. Stand pivot bed > wheelchair with CGA and RW, set up at sink for oral hygiene prior to leaving the room. Transported to gym and applied moist heat to B hands for 10 minutes as preparatory activity, skin in tact pre and post. Focused on grip strengthening and B hand ROM with theraputty for 1x5 grasps, rolling into log, pinches for both hands. Moist heat applied to L shoulder as well for 10 mins, skin in tact pre and post as well. Focused on towel slides for shoulder flexion/extension for 2x10. PROM/AAROM for L shoulder in flexion/extension, abduction/adduction, circumduction, ER/IR, and elbow flexion/extension to tolerance. Transported back to room, stand pivot w/c > recliner CGA. Alarm on call bell in reach.   Session 2: Pt greeted at time of session up in recliner with family present who remained during OT session. Pt did have some RLE pain, nursing aware and provided pain med pass at beginning of session. Pt agreeable to shower and family ed with daughter [name]. OT retrieved shower chair at beginning of session. Pt ambulated with CAM boot already donned to/from bathroom with CGA and RW. Posterior entry method into shower and using grab bar when able. Doffed LB clothing in standing and finished  seated. Covered RLE CAM boot and daughter education on how to do so. Pt performing UB/LB bathing with CGA seated and dried off same manner with assist to fully dry off LLE. Pt ambulating shower > bed with CGA and seated performed UB/LB dressing with Min A to thread/manage CAM boot but able to don underwear and pants over hips in standing before walking short distance to recliner CGA. Alarm on call bell in reach and family present. Note also discussed with family regarding grab bar placement and figuring out accessing bathroom.     Therapy Documentation Precautions:  Precautions Precautions: Fall Required Braces or Orthoses: Other Brace Other Brace: WBAT RLE with CAM boot Restrictions Weight Bearing Restrictions: Yes RLE Weight Bearing: Weight bearing as tolerated Other Position/Activity Restrictions: CAM boot when OOB and when WB RLE     Therapy/Group: Individual Therapy  Viona Gilmore 01/28/2021, 7:14 AM

## 2021-01-29 LAB — GLUCOSE, CAPILLARY
Glucose-Capillary: 115 mg/dL — ABNORMAL HIGH (ref 70–99)
Glucose-Capillary: 354 mg/dL — ABNORMAL HIGH (ref 70–99)
Glucose-Capillary: 75 mg/dL (ref 70–99)
Glucose-Capillary: 251 mg/dL — ABNORMAL HIGH (ref 70–99)

## 2021-01-29 NOTE — Progress Notes (Signed)
Physical Therapy Session Note  Patient Details  Name: Shelby Walker MRN: [id number] Date of Birth: [birth year]  Today's Date: 01/29/2021 PT Individual Time:0800-0905; 1030-1100 PT Individual Time Calculation (min): 65, 30 min   Short Term Goals: Week 1:  PT Short Term Goal 1 (Week 1): =LTG due to ELOS  Skilled Therapeutic Interventions/Progress Updates:  Tx 1:  Pt resting in bed.  She rated pain R foot 5/10.  She received pain meds before leaving room.  With [facility] raised, supine> sit with supervision.  Pt donned L shoe, and R cam boot with assistance.  Therapeutic exercise performed with LE to increase strength for functional mobility. Seated: 15 x 1 each: glut sets, R/L long arc quad knee extension, focusing on eccentric control R and ankle pumps at end range LLE.  Sit> stand with close supervision and cues for scooting forward before pushing up, to decrease strain on L biceps which she injured in near-fall at home about 4 weeks ago.  Gait training on level tile with turns, RW, x 105' with CGA except for 1 turn with min assist due to mild LOB.    From standing, pt picked up card from floor with RW and CGA.  Therapeutic activity in standing, for wt shifting and reaching to challenge balance, retrieving card from knee level table, passing to other hand and matching onto vertical board in front of her; also removing cards.  Pt correct on 7/9 cards.  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 Pt was very safe always keeping one hand on RW during this activity. Pt stated that she felt "shaky " after activity in standing x approx 3 minutes.  PT educated pt on paced breathing in sitting, which resolved the issue.   PT provided wc cushion for fit and function.  At end of session, pt seated in wc with needs at hand and seat belt alarm set.  Tx 2:  Pt resting in bed.  Cam boot doffed. She stated that she had felt nauseated and dizzy and had to get back in the bed.  She was medicated for nausea and stated  that she felt better.  She rated pain 2/10 R foot.  Pt had slid far toward foot of bed.  Cues for scooting up to [facility], using bil UEs and LLE. Rolling L><R without use of rails, with supervision and cues.  Pt tends to automatically pull on railings, which exacerbates L biceps strain.   Therapeutic exercise sperformed with LE to increase strength for functional mobility. 10 x 2 each: in supine- R/L straight leg raises with focus on eccentric control, R/L short arc quad knee extensions with isometric hold at end range and eccentric control; in R/L side lying, 12 x 1 eachL/R hip abduction with flexed hips and knees,  for gluteus medius activation.  At end of session, pt resting in bed with alarm set and needs at hand.     Therapy Documentation Precautions:  Precautions Precautions: Fall Required Braces or Orthoses: Other Brace Other Brace: WBAT RLE with CAM boot Restrictions Weight Bearing Restrictions: Yes RLE Weight Bearing: Weight bearing as tolerated Other Position/Activity Restrictions: CAM boot when OOB and when WB RLE       Therapy/Group: Individual Therapy  Cydne Grahn 01/29/2021, 2:25 PM

## 2021-01-29 NOTE — Progress Notes (Signed)
Physical Therapy Session Note  Patient Details  Name: Shelby Walker MRN: [id number] Date of Birth: [birth year]  Today's Date: 01/29/2021 PT Individual Time: 1515-1555 PT Individual Time Calculation (min): 40 min   Short Term Goals: Week 1:  PT Short Term Goal 1 (Week 1): =LTG due to ELOS  Skilled Therapeutic Interventions/Progress Updates:    Pt received seated in w/c in room, agreeable to PT session. Pt reports pain in her L shoulder/biceps at rest and in her R foot with mobility. Pt declines any pain intervention this PM, premedicated prior to start of therapy session and has been use hot packs for pain management. Sit to stand with CGA to RW during session. Ambulation 2 x 50 ft, x 100 ft with RW and CGA for balance, antalgic gait pattern with decreased stance time on RLE, cues to widen BOS to not trip on CAM boot. Adjusted CAM boot for improved fit due to pain in lateral portion of distal RLE while standing in boot. Pt reports improvement in pain following adjustment. Ascend/descend one 4" curb with threshold placed on top to simulate home environment, x 3 reps with RW and min A for balance. Ambulation across uneven surface with RW and CGA for balance with focus on safe RW management to simulate thick rug pt has at home. Pt left seated in w/c in room with needs in reach, family present at end of session.  Therapy Documentation Precautions:  Precautions Precautions: Fall Required Braces or Orthoses: Other Brace Other Brace: WBAT RLE with CAM boot Restrictions Weight Bearing Restrictions: Yes RLE Weight Bearing: Weight bearing as tolerated Other Position/Activity Restrictions: CAM boot when OOB and when WB RLE    Therapy/Group: Individual Therapy   Excell Seltzer, PT, DPT, CSRS  01/29/2021, 4:58 PM

## 2021-01-29 NOTE — Progress Notes (Signed)
Occupational Therapy Session Note  Patient Details  Name: Shelby Walker MRN: [id number] Date of Birth: September 26, 1933  Today's Date: 01/30/2021 OT Individual Time: 1034-1130 OT Individual Time Calculation (min): 56 min  34 minutes missed  Short Term Goals: Week 1:  OT Short Term Goal 1 (Week 1): STGs = LTGs 2/2 LOS at Mod I/Set up  Skilled Therapeutic Interventions/Progress Updates:    Pt greeted in bed, premedicated for Lt shoulder pain. She was agreeable to tx, setup for supine<sit from flat bed without bedrails. Donned CAM boot together however pt still c/o improper fit and rubbing on lateral aspect of foot. Discussed this with PA and PA reported that a new CAM/offloading shoe will be ordered for pt this week. CGA for short distance ambulatory transfer to the w/c using RW for standing support. She was escorted to the dayroom and set up a wii Just Dance activity. Therapeutic focus was placed on preparatory activity for UB/LB and also for gentle Lt shoulder ROM to address pain. Pt provided with dance exercise modifications to help with the Lt UE as well. After a few songs, pt reported not feeling well and asked for an emesis bag. Once this was retrieved pt vomited. She was transported back to the room and OT notified nursing of pts n/v, shakiness, and c/o just not feeling right. CGA for stand pivot<bed using RW and CAM boot was doffed. BP 140/56 when assessed. Pt was set up with an extra emesis bag, saltine crackers, ginger ale, and an antinausea aromatherapy blend. Left with all needs within reach and bed alarm set, tx time missed due to illness.   Therapy Documentation Precautions:  Precautions Precautions: Fall Required Braces or Orthoses: Other Brace Other Brace: WBAT RLE with CAM boot Restrictions Weight Bearing Restrictions: Yes RLE Weight Bearing: Weight bearing as tolerated Other Position/Activity Restrictions: CAM boot when OOB and when WB RLE ADL: ADL Eating: Set up Grooming:  Setup Upper Body Bathing: Minimal assistance Where Assessed-Upper Body Bathing: Sitting at sink Lower Body Bathing: Minimal assistance Where Assessed-Lower Body Bathing: Sitting at sink Upper Body Dressing: Minimal assistance Where Assessed-Upper Body Dressing: Sitting at sink Lower Body Dressing: Minimal assistance Where Assessed-Lower Body Dressing: Wheelchair Toileting: Minimal assistance Toilet Transfer: Minimal assistance Toilet Transfer Method: Stand pivot  Therapy/Group: Individual Therapy  Zeus Marquis A Ellagrace Yoshida 01/30/2021, 11:53 AM

## 2021-01-30 DIAGNOSIS — Z89431 Acquired absence of right foot: Secondary | ICD-10-CM | POA: Diagnosis not present

## 2021-01-30 LAB — CBC
HCT: 25.3 % — ABNORMAL LOW (ref 36.0–46.0)
Hemoglobin: 7.8 g/dL — ABNORMAL LOW (ref 12.0–15.0)
MCH: 28.6 pg (ref 26.0–34.0)
MCHC: 30.8 g/dL (ref 30.0–36.0)
MCV: 92.7 fL (ref 80.0–100.0)
Platelets: 269 10*3/uL (ref 150–400)
RBC: 2.73 MIL/uL — ABNORMAL LOW (ref 3.87–5.11)
RDW: 14.1 % (ref 11.5–15.5)
WBC: 6.6 10*3/uL (ref 4.0–10.5)
nRBC: 0 % (ref 0.0–0.2)

## 2021-01-30 LAB — GLUCOSE, CAPILLARY
Glucose-Capillary: 137 mg/dL — ABNORMAL HIGH (ref 70–99)
Glucose-Capillary: 113 mg/dL — ABNORMAL HIGH (ref 70–99)
Glucose-Capillary: 208 mg/dL — ABNORMAL HIGH (ref 70–99)
Glucose-Capillary: 118 mg/dL — ABNORMAL HIGH (ref 70–99)

## 2021-01-30 MED ORDER — INSULIN ASPART 100 UNIT/ML IJ SOLN
0.0000 [IU] | Freq: Three times a day (TID) | INTRAMUSCULAR | Status: DC
Start: 2021-01-31 — End: 2021-02-06
  Administered 2021-01-31: 2 [IU] via SUBCUTANEOUS
  Administered 2021-01-31: 1 [IU] via SUBCUTANEOUS
  Administered 2021-02-01: 5 [IU] via SUBCUTANEOUS
  Administered 2021-02-02: 3 [IU] via SUBCUTANEOUS
  Administered 2021-02-02: 1 [IU] via SUBCUTANEOUS
  Administered 2021-02-03 – 2021-02-04 (×2): 3 [IU] via SUBCUTANEOUS
  Administered 2021-02-04 – 2021-02-06 (×4): 1 [IU] via SUBCUTANEOUS

## 2021-01-30 MED ORDER — INSULIN ASPART 100 UNIT/ML IJ SOLN
3.0000 [IU] | Freq: Three times a day (TID) | INTRAMUSCULAR | Status: DC
  Administered 2021-01-31 – 2021-02-06 (×15): 3 [IU] via SUBCUTANEOUS

## 2021-01-30 MED ORDER — INSULIN ASPART 100 UNIT/ML IJ SOLN
0.0000 [IU] | Freq: Every day | INTRAMUSCULAR | Status: DC
  Administered 2021-02-03: 2 [IU] via SUBCUTANEOUS

## 2021-01-30 NOTE — Progress Notes (Addendum)
Inpatient Diabetes Program Recommendations  AACE/ADA: New Consensus Statement on Inpatient Glycemic Control (2015)  Target Ranges:  Prepandial:   less than 140 mg/dL      Peak postprandial:   less than 180 mg/dL (1-2 hours)      Critically ill patients:  140 - 180 mg/dL   Lab Results  Component Value Date   GLUCAP 137 (H) 01/30/2021   HGBA1C 7.9 (H) 01/18/2021    Review of Glycemic Control  Latest Reference Range & Units 01/29/21 06:25 01/29/21 11:53 01/29/21 16:24 01/29/21 21:01 01/30/21 06:31  Glucose-Capillary 70 - 99 mg/dL 115 (H) 354 (H) 75 251 (H) 137 (H)  (H): Data is abnormally high  Diabetes history:  DM2 Outpatient Diabetes medications:  Tresiba 6-8 units QHS, Novolog 6 units TID Current orders for Inpatient glycemic control: Semglee 8 units QD, Novolog 0-9 units RID and 0-5 units QHS, Novolog 3 units TID with meals and Prednisone 4 mg QAM  Inpatient DM consult received for DM management.  Fasting CBG' s 115, 137 mg/dL.  Patient may be elevated in the afternoon after receiving Prednisone 4 mg QAM, then receives correction which may be too much given her age and weight.  Might consider:  Novolog 0-6 units TID to avoid overcorrection.    Will continue to follow while inpatient.  Thank you, Reche Dixon, RN, BSN Diabetes Coordinator Inpatient Diabetes Program [phone] (team pager from 8a-5p)

## 2021-01-30 NOTE — Progress Notes (Signed)
PROGRESS NOTE   Subjective/Complaints:  Pt reports pain is mild this AM- 2/10- but hasn't lowered leg off bed, which usually hurts more.  Not throbbing right now.  LBM Saturday. Was thinking had to be dressed and ready for therapy- explained therapy helps with dressing.    ROS:   Pt denies SOB, abd pain, CP, N/V/C/D, and vision changes    Objective:   No results found. Recent Labs    01/30/21 0535  WBC 6.6  HGB 7.8*  HCT 25.3*  PLT 269    No results for input(s): NA, K, CL, CO2, GLUCOSE, BUN, CREATININE, CALCIUM in the last 72 hours.   Intake/Output Summary (Last 24 hours) at 01/30/2021 0830 Last data filed at 01/30/2021 0752 Gross per 24 hour  Intake 840 ml  Output --  Net 840 ml        Physical Exam: Vital Signs Blood pressure (!) 106/46, pulse 78, temperature 98.7 F (37.1 C), temperature source Oral, resp. rate 18, height 5\' 1"  (1.549 m), weight 56.9 kg, SpO2 100 %.   General: awake, alert, appropriate, HOH_ sitting up in bed; NAD HENT: conjugate gaze; oropharynx moist CV: irregular rhythm; regular rate; no JVD Pulmonary: CTA B/L; no W/R/R- good air movement GI: soft, NT, ND, (+)BS Psychiatric: appropriate Neurological: Ox3  Skin:- R TMA with ACE wrap over dressing- no change Musculoskeletal:     Cervical back: Normal range of motion. No rigidity.     Comments: UE strength 5-/5- however has severe RA chronic changes/ulnar deviation in hands B/L with B/L Hand atrophy  RLE- HF/KE/KF 5-/5; NT DF/PF due to CAM boot/pain and TMA LLE- 5-/5  R TMA dressed in ACE wrap; with CAM boot- mild ot moderate swelling- per surgeon to not take off surgical dressing til 2+ weeks when they will remove staples.   Skin:    Comments: Ecchymoses in UE B/L and LLE notable IV L forearm- looks OK No skin changes on backside seen- blanchable redness; bony coccyx L heel slightly boggy- no skin breakdown   Neurological:     Comments: Intact to light touch in UE B/L  LLE- intact to L knee and RLE intact to knee- decreased below knee B/L   Psychiatric:        Behavior: Behavior normal.     Comments: HOH    Assessment/Plan: 1. Functional deficits which require 3+ hours per day of interdisciplinary therapy in a comprehensive inpatient rehab setting. Physiatrist is providing close team supervision and 24 hour management of active medical problems listed below. Physiatrist and rehab team continue to assess barriers to discharge/monitor patient progress toward functional and medical goals  Care Tool:  Bathing    Body parts bathed by patient: Right arm, Left arm, Chest, Abdomen, Front perineal area, Right upper leg, Left upper leg, Face, Buttocks, Left lower leg   Body parts bathed by helper: Left lower leg, Buttocks Body parts n/a: Right lower leg   Bathing assist Assist Level: Contact Guard/Touching assist     Upper Body Dressing/Undressing Upper body dressing   What is the patient wearing?: Bra, Pull over shirt    Upper body assist Assist Level: Minimal Assistance - Patient >  75%    Lower Body Dressing/Undressing Lower body dressing      What is the patient wearing?: Pants, Underwear/pull up     Lower body assist Assist for lower body dressing: Minimal Assistance - Patient > 75%     Toileting Toileting    Toileting assist Assist for toileting: Minimal Assistance - Patient > 75%     Transfers Chair/bed transfer  Transfers assist     Chair/bed transfer assist level: Contact Guard/Touching assist     Locomotion Ambulation   Ambulation assist      Assist level: Minimal Assistance - Patient > 75% Assistive device: Walker-rolling Max distance: 105   Walk 10 feet activity   Assist     Assist level: Contact Guard/Touching assist Assistive device: Walker-rolling   Walk 50 feet activity   Assist Walk 50 feet with 2 turns activity did not occur:  Safety/medical concerns  Assist level: Minimal Assistance - Patient > 75% Assistive device: Walker-rolling    Walk 150 feet activity   Assist Walk 150 feet activity did not occur: Safety/medical concerns         Walk 10 feet on uneven surface  activity   Assist Walk 10 feet on uneven surfaces activity did not occur: Safety/medical concerns         Wheelchair     Assist Is the patient using a wheelchair?: No Type of Wheelchair: Manual    Wheelchair assist level: Dependent - Patient 0% Max wheelchair distance: 150'    Wheelchair 50 feet with 2 turns activity    Assist        Assist Level: Dependent - Patient 0%   Wheelchair 150 feet activity     Assist      Assist Level: Dependent - Patient 0%   Blood pressure (!) 106/46, pulse 78, temperature 98.7 F (37.1 C), temperature source Oral, resp. rate 18, height 5\' 1"  (1.549 m), weight 56.9 kg, SpO2 100 %.  Medical Problem List and Plan: 1. 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  Antithrombotics: -DVT/anticoagulation:  Pharmaceutical: Other (comment)--on Eliquis             -antiplatelet therapy: N/a 3. Pain Management: Using tramadol qid prn with occasional oxycodone prn. Also has voltaren gel and lidoderm for L shoulder pain  11/24- pain controlled when takes meds- encouraged her to ask for them- con't regimen  11/25- will look at doing steroid injection next Wednesday if need be; won't do today because want to see if will get better- since is diabetic- trying to avoid elevated BG's.   11/26: scheduled gabapentin 100mg  TID. Replaced prn oxycodone with scheduled Tylenol #3 TID as patient took previously.   11/28- pt reports pain much better 2/10 this AM- con't regimen             4. Mood: LCSW to follow for evaluation and  support.              -antipsychotic agents: N/A 5. Neuropsych: This patient is capable of making decisions on her own behalf. 6. Skin/Wound Care: Monitor wound for healing. Has low protein stores--alb-2.4             --add Juven to supplement  low calorie malnutrition             --contacted Dr. Doran Durand re: dressing change/orders. Was informed dressing needs to stay on till follow up in office. Per Ortho surgeon 7. Fluids/Electrolytes/Nutrition: Monitor I/O. Check CMET in am.  8. A fib: Monitor HR TID-- on Coreg BID w/Eliquis  11/28- rate controlled- con't regimen 9. T2DM: Hgb A1C-  continue insulin Glargine with SSI.   11/24- BG good this AM, but in 200s yesterday- will monitor for trend and change insulin tomorrow if needed  11/25- BG's 108-188- with 1 episode of 268- will monitor for trend, only having 1 spike today- better than yesterday.   11/28 - 75 to 354- 2 episodes really high 200s-300s and 2 low- will ask Dm coordinator to help.   10. Acute blood loss anemia: Recheck CBC in am.  11. Chemo/Diabetes induced peripheral neuropathy: Managed with Neurontin  12. RA: On prednisone with monthly infusion of DMARD.  13. Right knee pain: Will add Voltaren gel qid to knee. 14. Left bicipital tendinitis: Continue lidocaine patch to shoulder. If pain not better by Friday, will do steroid injection as long as BG's controlled  11/28- wait on steroid injection due to BG elevations.  15. Chronic angina: SL nitro ordered PRN.  16. SBP high and DBP low: continued to monitor TID  17. Dispo 11/24- will d/c IV       LOS: 6 days A FACE TO FACE EVALUATION WAS PERFORMED  Shelby Walker 01/30/2021, 8:30 AM

## 2021-01-30 NOTE — Discharge Instructions (Addendum)
Inpatient Rehab Discharge Instructions  Shelby Walker Discharge date and time:  02/06/21  Activities/Precautions/ Functional Status: Activity: activity as tolerated with boot for walking Diet: diabetic diet Wound Care:  wash with soap and water, pat dry and apply dry dressing.  .  Contact Dr. Doran Durand  if you develop any problems with your incision/wound--redness, swelling, increase in pain, drainage or if you develop fever or chills.    Functional status:  ___ No restrictions     ___ Walk up steps independently _X__ 24/7 supervision/assistance   ___ Walk up steps with assistance ___ Intermittent supervision/assistance  ___ Bathe/dress independently ___ Walk with walker     __X_ Bathe/dress with assistance ___ Walk Independently    ___ Shower independently ___ Walk with assistance    ___ Shower with assistance _X__ No alcohol     ___ Return to work/school ________   Special Instructions: Use spirometer four times a day to help your lungs   COMMUNITY REFERRALS UPON DISCHARGE:    Home Health:   PT, OT , RN                Agency: [facility] Phone: [phone]  Medical Equipment/Items Ordered:  3 IN 1                                                 Agency/Supplier:[facility]  [phone]   My questions have been answered and I understand these instructions. I will adhere to these goals and the provided educational materials after my discharge from the hospital.  Patient/Caregiver Signature _______________________________ Date __________  Clinician Signature _______________________________________ Date __________  Please bring this form and your medication list with you to all your follow-up doctor's appointments.      Information on my medicine - ELIQUIS (apixaban)  This medication education was reviewed with me or my healthcare representative as part of my discharge preparation.  The pharmacist that spoke with me during my hospital stay was:    Why was Eliquis  prescribed for you? Eliquis was prescribed for you to reduce the risk of a blood clot forming that can cause a stroke if you have a medical condition called atrial fibrillation (a type of irregular heartbeat).  What do You need to know about Eliquis ? Take your Eliquis TWICE DAILY - one tablet in the morning and one tablet in the evening with or without food. If you have difficulty swallowing the tablet whole please discuss with your pharmacist how to take the medication safely.  Take Eliquis exactly as prescribed by your doctor and DO NOT stop taking Eliquis without talking to the doctor who prescribed the medication.  Stopping may increase your risk of developing a stroke.  Refill your prescription before you run out.  After discharge, you should have regular check-up appointments with your healthcare provider that is prescribing your Eliquis.  In the future your dose may need to be changed if your kidney function or weight changes by a significant amount or as you get older.  What do you do if you miss a dose? If you miss a dose, take it as soon as you remember on the same day and resume taking twice daily.  Do not take more than one dose of ELIQUIS at the same time to make up a missed dose.  Important Safety Information A possible  side effect of Eliquis is bleeding. You should call your healthcare provider right away if you experience any of the following: Bleeding from an injury or your nose that does not stop. Unusual colored urine (red or dark brown) or unusual colored stools (red or black). Unusual bruising for unknown reasons. A serious fall or if you hit your head (even if there is no bleeding).  Some medicines may interact with Eliquis and might increase your risk of bleeding or clotting while on Eliquis. To help avoid this, consult your healthcare provider or pharmacist prior to using any new prescription or non-prescription medications, including herbals, vitamins,  non-steroidal anti-inflammatory drugs (NSAIDs) and supplements.  This website has more information on Eliquis (apixaban): http://www.eliquis.com/eliquis/home

## 2021-01-30 NOTE — Progress Notes (Signed)
Physical Therapy Session Note  Patient Details  Name: Shelby Walker MRN: [id number] Date of Birth: 1933-06-25  Today's Date: 01/30/2021 PT Individual Time: 0800-0923 PT Individual Time Calculation (min): 83 min   Short Term Goals: Week 1:  PT Short Term Goal 1 (Week 1): =LTG due to ELOS  Skilled Therapeutic Interventions/Progress Updates:    Pt seated EOB on arrival and agreeable to therapy. Reports pain in her lateral foot and ankle with activity, rest breaks PRN and therapy to tolerance. Requested to use bathroom and dress before leaving room. Donned CAM boot with tot A for time before ambulatory transfer to commode with CGA and RW throughout. Pt completed continent urine void and hygiene with distant supervision. Min A to thread clean underwear over CAM boot (pt normally completes this in a low chair at home and can reach her feet). CGA for pulling underwear over hips. ambulatory transfer back to EOB to complete dressing. Nsg in/out for meds pass, nsg took BP=106/46, with MAP=64. Pt reports no symptoms during previous activity. Continued to monitor symptoms throughout session. Pt ambulated to/from gyms throughout session, >100 ft with RW and CGA-close supervision at times, demoes antalgic gait pattern and self initiated attempt for upright posture. Pt navigated 6" stairs x 4 with both hand rails and 2 x 4 with both hands on L handrail to mimic home environment. Pt then ambulated to ortho gym in the same manner. Pt was asymptomatic throughout all activities. Pt then performed UBE in sitting at level 1.0 x 1.5 minutes before reporting that she was feeling lightheaded. BP=94/50, MAP=64 while seated in w/c. Pt transported back to room for safety and returned to bed with CGA Stand pivot transfer with RW. Sit>supine with bed features and supervision. BP=116/47, MAP=68 in supine. Nsg made aware. Pt was left with all needs in reach and alarm active.   Therapy Documentation Precautions:   Precautions Precautions: Fall Required Braces or Orthoses: Other Brace Other Brace: WBAT RLE with CAM boot Restrictions Weight Bearing Restrictions: Yes RLE Weight Bearing: Weight bearing as tolerated Other Position/Activity Restrictions: CAM boot when OOB and when WB RLE    Therapy/Group: Individual Therapy  Mickel Fuchs 01/30/2021, 12:53 PM

## 2021-01-31 ENCOUNTER — Ambulatory Visit: Payer: Medicare Other | Admitting: Physical Medicine and Rehabilitation

## 2021-01-31 LAB — GLUCOSE, CAPILLARY
Glucose-Capillary: 146 mg/dL — ABNORMAL HIGH (ref 70–99)
Glucose-Capillary: 217 mg/dL — ABNORMAL HIGH (ref 70–99)
Glucose-Capillary: 162 mg/dL — ABNORMAL HIGH (ref 70–99)
Glucose-Capillary: 133 mg/dL — ABNORMAL HIGH (ref 70–99)

## 2021-01-31 MED ORDER — SORBITOL 70 % SOLN
30.0000 mL | Freq: Once | Status: AC
  Administered 2021-01-31: 30 mL via ORAL

## 2021-01-31 MED ORDER — SORBITOL 70 % SOLN
Status: AC
  Filled 2021-01-31: qty 30

## 2021-01-31 NOTE — Progress Notes (Signed)
Occupational Therapy Session Note  Patient Details  Name: Shelby Walker MRN: [id number] Date of Birth: [birth year]  Today's Date: 01/31/2021 OT Individual Time: [phone] and 1515-1630  OT Individual Time Calculation (min): 57 min and 75 min   Short Term Goals: Week 1:  OT Short Term Goal 1 (Week 1): STGs = LTGs 2/2 LOS at Mod I/Set up   Skilled Therapeutic Interventions/Progress Updates:    Session 1: Pt greeted at time of session up in wheelchair ready for OT session, no pain at rest but did have L shoulder pain during mobility with ADL, rest breaks and compensatory techniques PRN. Self propel to sink level and performed UB/LB bathing seated with Supervision, pt declined buttocks stating she washed her bottom during toileting earlier. Note pt toileting at sink level seated, discussed with pt having a seat in bathroom at home, pt already has this prepared. UB dress for shirt only (already had on bra) with set up, and pants (already had on underwear) with CGA for standing. Note pt threaded pants prior to donning CAM boot with Min A for boot, pt demonstrating improved ability to don her own orthosis. Donned pants over hips sit > stand CGA. Discussion with pt throughout session regarding home set up, bathroom set up, accessibility, etc. Set up with alarm on call bell in reach.    Session 2: Pt greeted at time of session sitting up in recliner with daughter Shelby Walker present. No pain at rest but did have pain in R foot on lateral aspect with mobility. Note therapist re-wrapped ace wrap to prevent bulking on lateral side and pt stating this did make it better. Focus of session initially on discussion with pt/family regarding DC date, concerns they have thinking this is too soon, and wanting surgeon input on R foot prior to DC. Spoke with SW and relayed concerns, also sent message to MD. Extensive amount of time spent in discussion with pt and daughter regarding CLOF, DC planning, and concerns regarding going  home. Pt able to don/doff CAM boot today with supervision and extended amount of time with RLE elevated on ELR. Simulated walk in shower transfer with pt stand pivot wheelchair <> toilet CGA and HHA with use of grab bar in/out of walk in shower for home practice with daughter present. Educated on wrapping technique for RLE for waterproofing with trash bags. Also provided pt with paper for visual cuing as a reminder for MD rounding tomorrow AM with her questions. Pt up in wheelchair alarm on call bell in reach and RLE elevated.   Therapy Documentation Precautions:  Precautions Precautions: Fall Required Braces or Orthoses: Other Brace Other Brace: WBAT RLE with CAM boot Restrictions Weight Bearing Restrictions: Yes RLE Weight Bearing: Weight bearing as tolerated Other Position/Activity Restrictions: CAM boot when OOB and when WB RLE    Therapy/Group: Individual Therapy  Viona Gilmore 01/31/2021, 7:18 AM

## 2021-01-31 NOTE — Progress Notes (Signed)
PROGRESS NOTE   Subjective/Complaints:  Pt reports pain tolerable this AM- waiting for AM meds.  Pain also doing better in L shoulder- couldn't lift yesterday but can today.  LBM 2 days ago- feeling constipated.    ROS:  Pt denies SOB, abd pain, CP, N/V/(+)C/D, and vision changes   Objective:   No results found. Recent Labs    01/30/21 0535  WBC 6.6  HGB 7.8*  HCT 25.3*  PLT 269    No results for input(s): NA, K, CL, CO2, GLUCOSE, BUN, CREATININE, CALCIUM in the last 72 hours.   Intake/Output Summary (Last 24 hours) at 01/31/2021 0839 Last data filed at 01/31/2021 0809 Gross per 24 hour  Intake 536 ml  Output --  Net 536 ml        Physical Exam: Vital Signs Blood pressure (!) 121/56, pulse 74, temperature 99 F (37.2 C), temperature source Oral, resp. rate 14, height 5\' 1"  (1.549 m), weight 56.9 kg, SpO2 97 %.    General: awake, alert, appropriate, HOH- sitting up in bed- ate 95% of tray; NAD HENT: conjugate gaze; oropharynx moist CV: irregular; regular rate; no JVD Pulmonary: CTA B/L; no W/R/R- good air movement GI: soft, NT, ND, (+)BS Psychiatric: appropriate; interactive Neurological: Ox3  Skin:- R TMA with ACE wrap over dressing- no change Musculoskeletal:     Cervical back: Normal range of motion. L shoulder- can lift arm in flexion to ~ 85 degrees and abduction to 90 degrees- is TTP over biceps tendon origin.     Comments: UE strength 5-/5- however has severe RA chronic changes/ulnar deviation in hands B/L with B/L Hand atrophy  RLE- HF/KE/KF 5-/5; NT DF/PF due to CAM boot/pain and TMA LLE- 5-/5  R TMA dressed in ACE wrap; with CAM boot- mild ot moderate swelling- per surgeon to not take off surgical dressing til 2+ weeks when they will remove staples.   Skin:    Comments: Ecchymoses in UE B/L and LLE notable IV L forearm- looks OK No skin changes on backside seen- blanchable redness; bony  coccyx L heel slightly boggy- no skin breakdown  Neurological:     Comments: Intact to light touch in UE B/L  LLE- intact to L knee and RLE intact to knee- decreased below knee B/L   Psychiatric:        Behavior: Behavior normal.     Comments: HOH    Assessment/Plan: 1. Functional deficits which require 3+ hours per day of interdisciplinary therapy in a comprehensive inpatient rehab setting. Physiatrist is providing close team supervision and 24 hour management of active medical problems listed below. Physiatrist and rehab team continue to assess barriers to discharge/monitor patient progress toward functional and medical goals  Care Tool:  Bathing    Body parts bathed by patient: Right arm, Left arm, Chest, Abdomen, Front perineal area, Right upper leg, Left upper leg, Face, Buttocks, Left lower leg   Body parts bathed by helper: Left lower leg, Buttocks Body parts n/a: Right lower leg   Bathing assist Assist Level: Contact Guard/Touching assist     Upper Body Dressing/Undressing Upper body dressing   What is the patient wearing?: Bra, Pull over shirt  Upper body assist Assist Level: Minimal Assistance - Patient > 75%    Lower Body Dressing/Undressing Lower body dressing      What is the patient wearing?: Pants, Underwear/pull up     Lower body assist Assist for lower body dressing: Minimal Assistance - Patient > 75%     Toileting Toileting    Toileting assist Assist for toileting: Minimal Assistance - Patient > 75%     Transfers Chair/bed transfer  Transfers assist     Chair/bed transfer assist level: Contact Guard/Touching assist     Locomotion Ambulation   Ambulation assist      Assist level: Minimal Assistance - Patient > 75% Assistive device: Walker-rolling Max distance: 105   Walk 10 feet activity   Assist     Assist level: Contact Guard/Touching assist Assistive device: Walker-rolling   Walk 50 feet activity   Assist Walk 50  feet with 2 turns activity did not occur: Safety/medical concerns  Assist level: Minimal Assistance - Patient > 75% Assistive device: Walker-rolling    Walk 150 feet activity   Assist Walk 150 feet activity did not occur: Safety/medical concerns         Walk 10 feet on uneven surface  activity   Assist Walk 10 feet on uneven surfaces activity did not occur: Safety/medical concerns         Wheelchair     Assist Is the patient using a wheelchair?: No Type of Wheelchair: Manual    Wheelchair assist level: Dependent - Patient 0% Max wheelchair distance: 150'    Wheelchair 50 feet with 2 turns activity    Assist        Assist Level: Dependent - Patient 0%   Wheelchair 150 feet activity     Assist      Assist Level: Dependent - Patient 0%   Blood pressure (!) 121/56, pulse 74, temperature 99 F (37.2 C), temperature source Oral, resp. rate 14, height 5\' 1"  (1.549 m), weight 56.9 kg, SpO2 97 %.  Medical Problem List and Plan: 1. 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  Antithrombotics: -DVT/anticoagulation:  Pharmaceutical: Other (comment)--on Eliquis             -antiplatelet therapy: N/a 3. Pain Management: Using tramadol qid prn with occasional oxycodone prn. Also has voltaren gel and lidoderm for L shoulder pain  11/24- pain controlled when takes meds- encouraged her to ask for them- con't regimen  11/25- will look at doing steroid injection next Wednesday if need be; won't do today because want to see if will get better- since is diabetic- trying to avoid elevated BG's.   11/26: scheduled gabapentin 100mg  TID. Replaced prn oxycodone with scheduled Tylenol #3 TID as patient took previously.   11/28- pt reports pain much  better 2/10 this AM- con't regimen  11/29- pain doing better even in L  shoulder- since CBGs been elevated, will wait on steroid injection.              4. Mood: LCSW to follow for evaluation and support.              -antipsychotic agents: N/A 5. Neuropsych:  This patient is capable of making decisions on her own behalf. 6. Skin/Wound Care: Monitor wound for healing. Has low protein stores--alb-2.4             --add Juven to supplement low calorie malnutrition             --contacted Dr. Doran Durand re: dressing change/orders. Was informed dressing needs to stay on till follow up in office. Per Ortho surgeon 7. Fluids/Electrolytes/Nutrition: Monitor I/O. Check CMET in am.  8. A fib: Monitor HR TID-- on Coreg BID w/Eliquis  11/28- rate controlled- con't regimen 9. T2DM: Hgb A1C-  continue insulin Glargine with SSI.   11/24- BG good this AM, but in 200s yesterday- will monitor for trend and change insulin tomorrow if needed  11/25- BG's 108-188- with 1 episode of 268- will monitor for trend, only having 1 spike today- better than yesterday.   11/28 - 75 to 354- 2 episodes really high 200s-300s and 2 low- will ask Dm coordinator to help.   11/29- DM coordinator changed her SSI to 0-6- and will monitor/follow-  10. Acute blood loss anemia: Recheck CBC in am.  11. Chemo/Diabetes induced peripheral neuropathy: Managed with Neurontin  12. RA: On prednisone with monthly infusion of DMARD.  13. Right knee pain: Will add Voltaren gel qid to knee. 14. Left bicipital tendinitis: Continue lidocaine patch to shoulder. If pain not better by Friday, will do steroid injection as long as BG's controlled  11/29- pain/ROM doing better- wait on Steroid injection 15. Chronic angina: SL nitro ordered PRN.  16. SBP high and DBP low: continued to monitor TID 17. Constipation  11/29- LBM 2 days ago- feels constipated- will order Sorbitol at 3pm, if doesn't go.       LOS: 7 days A FACE TO FACE EVALUATION WAS  PERFORMED  Brittie Whisnant 01/31/2021, 8:39 AM

## 2021-01-31 NOTE — Patient Care Conference (Signed)
Inpatient RehabilitationTeam Conference and Plan of Care Update Date: 01/31/2021   Time: 11:39 AM    Patient Name: Shelby Walker      Medical Record Number: [id number]  Date of Birth: [birth year] Sex: Female         Room/Bed: 4M12C/4M12C-01 Payor Info: Payor: MEDICARE / Plan: MEDICARE PART A AND B / Product Type: *No Product type* /    Admit Date/Time:  01/24/2021  4:18 PM  Primary Diagnosis:  S/P transmetatarsal amputation of foot, right [facility])  Hospital Problems: Principal Problem:   S/P transmetatarsal amputation of foot, right [facility])    Expected Discharge Date: Expected Discharge Date: 02/02/21  Team Members Present: Physician leading conference: Dr. Courtney Heys Social Worker Present: Ovidio Kin, LCSW Nurse Present: Dorthula Nettles, RN PT Present: Ailene Rud, PT OT Present: Lillia Corporal, OT PPS Coordinator present : Gunnar Fusi, SLP     Current Status/Progress Goal Weekly Team Focus  Bowel/Bladder   Patient is continent of bowel and bladder  Remain continent of Bowel and bladder  continue to be continant   Swallow/Nutrition/ Hydration             ADL's   CGA ambulatory bathroom transfers using RW, CGA bathing, Min A UB dressing, Min-Mod A LB dressing, limited this weekend by malaise, not feeling "right" and c/o improper fit of CAM boot  Supervision-Mod I overall  pt/family education, functional trasnfers, adaptive self care skills, activity tolerance   Mobility   CGA gait and transfers with RW, CGA stairs with 1 handrail  mod I overall, stairs supervision  LE strength endurance, stairs   Communication             Safety/Cognition/ Behavioral Observations            Pain   Patient has pain in multiple locations rating between 6-8/10  Decrease pain level to >4/10  encourage pt to express pain levels (pt feels like burden and doesnt like to call out)   Skin   Pt has a MASD in coccyx area  to promote healing  Increase mobility and protein intake      Discharge Planning:  Home to her home with fmaily members coming in and out as they did before. If more care needed will need to come up with another plan   Team Discussion: CBG's up and down, consulted Diabetic Coordinator. SSI made more sensitive. Having lateral foot and pain. Has urinary urgency. Has Voltaren gel for shoulder pain. MASD to bottom being treated appropriately. Amputation site CDI with staples. Plan is to discharge home with family members checking in on her as before admission.  Patient on target to meet rehab goals: yes, Supervision/Mod I goals. Can do Mod I toileting. Needs assistance with Cam boot.   *See Care Plan and progress notes for long and short-term goals.   Revisions to Treatment Plan:  Adjusting medications and finalizing discharge plans.  Teaching Needs: Family education, medication/pain management, skin/wound care, safety awareness, diabetes management, transfer/gait training, etc.  Current Barriers to Discharge: Wound care, Weight bearing restrictions, and Medication compliance  Possible Resolutions to Barriers: Family education Order DME Follow up HH/Outpatient therapy     Medical Summary Current Status: R TMA - has urgency; constipated; uses voltaren gel for L shoulder; CBG's labile- called Dm coordinators;  Barriers to Discharge: Decreased family/caregiver support;Home enviroment access/layout;Medical stability;Wound care;Weight bearing restrictions  Barriers to Discharge Comments: her WB restrictions as well as labile CBGs Possible Resolutions to [employer] Focus: R TMA- maintain  Wb precautions- sorbitol for constipaiton- Dm coordinators for BG's; pt feels like CAM boot deviating- boot looks fine; like pressing into side- goals Supervsiion- mod I- will meet goals- d/c Thursday 02/02/21   Continued Need for Acute Rehabilitation Level of Care: The patient requires daily medical management by a physician with specialized training in physical  medicine and rehabilitation for the following reasons: Direction of a multidisciplinary physical rehabilitation program to maximize functional independence : Yes Medical management of patient stability for increased activity during participation in an intensive rehabilitation regime.: Yes Analysis of laboratory values and/or radiology reports with any subsequent need for medication adjustment and/or medical intervention. : Yes   I attest that I was present, lead the team conference, and concur with the assessment and plan of the team.   Cristi Loron 01/31/2021, 4:42 PM

## 2021-01-31 NOTE — Progress Notes (Signed)
Patient ID: Shelby Walker, female   DOB: 1933-03-29, 87 y.o.   MRN: [id number]  met with pt and daughter to inform of team conference goals of mod/I-supervision level and target discharge date of 12/1. Both want the surgeon or his PA to look at her foot to make sure healing well and to wrap it correctly. Daughter has been through therapies with pt and has seen her moving and what she needs assist with. They agree with 3 in 1 they have a rolling walker along with a rollator. Prefer to use [facility] since had them before. Will make referrals and have made MD and PA aware of request to have surgeon see prior to discharge.

## 2021-01-31 NOTE — Progress Notes (Signed)
Physical Therapy Session Note  Patient Details  Name: Shelby Walker MRN: [id number] Date of Birth: 1933-07-20  Today's Date: 01/31/2021 PT Individual Time: 1300-1415 PT Individual Time Calculation (min): 75 min   Short Term Goals: Week 1:  PT Short Term Goal 1 (Week 1): =LTG due to ELOS  Skilled Therapeutic Interventions/Progress Updates:    Pt seated in w/c on arrival and agreeable to therapy with her daughter, [name], present. Session focused on family education. Pt ambulated with supervision and RW throughout session. Occ cueing for safety, like when turning to sit required.   Bulk of session spent problem solving and practicing stairs. 8" step with 2nd, 6" step used to mimic pt's home set up. After several trials, HHA seems to be most beneficial since pt has only grab bar on L side. Pt demoes mildly impulsive behavior, but [name] provided appropriate guarding and cues throughout after training on technique.   Pt also navigated mulch and ramp with CGA to mimic shag rug in pt's home. Curb step with VC and CGA. [name] demonstrated appropriate guarding and cues throughout. Pt returned to room and sat in recliner, was left with her daughter present and alarm active.   Therapy Documentation Precautions:  Precautions Precautions: Fall Required Braces or Orthoses: Other Brace Other Brace: WBAT RLE with CAM boot Restrictions Weight Bearing Restrictions: Yes RLE Weight Bearing: Weight bearing as tolerated Other Position/Activity Restrictions: CAM boot when OOB and when WB RLE    Therapy/Group: Individual Therapy  Mickel Fuchs 01/31/2021, 4:14 PM

## 2021-01-31 NOTE — Progress Notes (Signed)
Physical Therapy Discharge Summary  Patient Details  Name: Shelby Walker MRN: [id number] Date of Birth: 1933/09/23    Patient has met 6 of 8 long term goals due to improved activity tolerance, improved balance, increased strength, ability to compensate for deficits, and improved coordination. Patient to discharge at an ambulatory level Supervision with RW.  Patient's care partner is independent to provide the necessary physical assistance at discharge. Pt to d/c home with family within 10 min and heavily involved. Pt's daughter, [name], participated in family training and demonstrated appropriate guarding and assist techniques throughout.   Reasons goals not met: Pt requires supervision at times for safety during gait, especially when fatigued.  Recommendation:  Patient will benefit from ongoing skilled PT services in home health setting to continue to advance safe functional mobility, address ongoing impairments in LE strength, balance, mobility, and minimize fall risk.  Equipment: Youth RW  Reasons for discharge: treatment goals met and discharge from hospital  Patient/family agrees with progress made and goals achieved: Yes  PT Discharge Precautions/Restrictions Precautions Precautions: Fall Required Braces or Orthoses: Other Brace Other Brace: WBAT RLE with CAM boot Restrictions Weight Bearing Restrictions: Yes RLE Weight Bearing: Weight bearing as tolerated Other Position/Activity Restrictions: CAM boot when OOB and when WB RLE Vital Signs  Pain   Pain Interference Pain Interference Pain Effect on Sleep: 2. Occasionally Pain Interference with Therapy Activities: 2. Occasionally Pain Interference with Day-to-Day Activities: 2. Occasionally Vision/Perception  Perception Perception: Within Functional Limits Praxis Praxis: Intact  Cognition Overall Cognitive Status: Within Functional Limits for tasks assessed Arousal/Alertness: Awake/alert Orientation Level: Oriented  X4 Year: 2022 Month: November Day of Week: Correct Focused Attention: Appears intact Sustained Attention: Appears intact Memory: Appears intact Safety/Judgment: Appears intact Sensation Sensation Light Touch: Appears Intact Proprioception: Appears Intact Coordination Gross Motor Movements are Fluid and Coordinated: No Coordination and Movement Description: grossly uncoordinated 2/2 RLE CAM boot and pain Motor  Motor Motor: Within Functional Limits Motor - Skilled Clinical Observations: arthritic changes limiting, decreased coorindation 2/2 CAM boot  Mobility Bed Mobility Rolling Right: Independent Rolling Left: Independent Supine to Sit: Independent with assistive device Sit to Supine: Independent with assistive device Transfers Transfers: Sit to Stand;Stand to Sit;Stand Pivot Transfers Sit to Stand: Independent with assistive device Stand to Sit: Independent with assistive device Stand Pivot Transfers: Independent with assistive device Stand Pivot Transfer Details: Verbal cues for safe use of DME/AE;Verbal cues for technique;Verbal cues for precautions/safety;Verbal cues for sequencing Transfer (Assistive device): Rolling walker Locomotion  Gait Ambulation: Yes Gait Assistance: Independent with assistive device Gait Distance (Feet): 200 Feet Assistive device: Rolling walker Gait Gait: Yes Gait Pattern: Impaired Gait Pattern: Decreased step length - left;Decreased stride length;Poor foot clearance - right;Poor foot clearance - left;Narrow base of support Gait velocity: decreased Stairs / Additional Locomotion Stairs: Yes Stairs Assistance: Supervision/Verbal cueing Stair Management Technique: Two rails Number of Stairs: 4 Height of Stairs: 6 Ramp: Supervision/Verbal cueing Curb: Contact Guard/Touching assist Wheelchair Mobility Wheelchair Mobility: No  Trunk/Postural Assessment  Cervical Assessment Cervical Assessment: Within Functional Limits Thoracic  Assessment Thoracic Assessment: Exceptions to [facility] Lumbar Assessment Lumbar Assessment: Exceptions to [facility] Postural Control Postural Control: Within Functional Limits  Balance Balance Balance Assessed: Yes Static Sitting Balance Static Sitting - Balance Support: Feet supported Static Sitting - Level of Assistance: 6: Modified independent (Device/Increase time) Dynamic Sitting Balance Dynamic Sitting - Balance Support: Feet supported Dynamic Sitting - Level of Assistance: 6: Modified independent (Device/Increase time) Static Standing Balance Static Standing - Balance Support:  Bilateral upper extremity supported;During functional activity Static Standing - Level of Assistance: 6: Modified independent (Device/Increase time) Dynamic Standing Balance Dynamic Standing - Balance Support: During functional activity Dynamic Standing - Level of Assistance: 5: Stand by assistance Extremity Assessment      RLE Assessment RLE Assessment: Exceptions to [facility] Strength Comments: ankle not tested d/t CAM boot RLE Strength Right Hip Flexion: 4/5 Right Knee Flexion: 5/5 Right Knee Extension: 4/5 Right Ankle Dorsiflexion:  (NT d/t cam boot) LLE Assessment LLE Assessment: Within Functional Limits LLE Strength Left Hip Flexion: 4/5 Left Knee Flexion: 5/5 Left Knee Extension: 5/5 Left Ankle Dorsiflexion: 5/5  Linford Quintela C Cyndal Kasson 01/31/2021, 4:25 PM

## 2021-02-01 DIAGNOSIS — N1832 Chronic kidney disease, stage 3b: Secondary | ICD-10-CM | POA: Diagnosis not present

## 2021-02-01 DIAGNOSIS — I480 Paroxysmal atrial fibrillation: Secondary | ICD-10-CM | POA: Diagnosis not present

## 2021-02-01 DIAGNOSIS — I10 Essential (primary) hypertension: Secondary | ICD-10-CM | POA: Diagnosis not present

## 2021-02-01 DIAGNOSIS — I700 Atherosclerosis of aorta: Secondary | ICD-10-CM | POA: Diagnosis not present

## 2021-02-01 LAB — BASIC METABOLIC PANEL
Anion gap: 8 (ref 5–15)
BUN: 68 mg/dL — ABNORMAL HIGH (ref 8–23)
CO2: 30 mmol/L (ref 22–32)
Calcium: 8.8 mg/dL — ABNORMAL LOW (ref 8.9–10.3)
Chloride: 102 mmol/L (ref 98–111)
Creatinine, Ser: 1.47 mg/dL — ABNORMAL HIGH (ref 0.44–1.00)
GFR, Estimated: 35 mL/min — ABNORMAL LOW (ref >60)
Glucose, Bld: 163 mg/dL — ABNORMAL HIGH (ref 70–99)
Potassium: 4.6 mmol/L (ref 3.5–5.1)
Sodium: 140 mmol/L (ref 135–145)

## 2021-02-01 LAB — GLUCOSE, CAPILLARY
Glucose-Capillary: 202 mg/dL — ABNORMAL HIGH (ref 70–99)
Glucose-Capillary: 361 mg/dL — ABNORMAL HIGH (ref 70–99)
Glucose-Capillary: 149 mg/dL — ABNORMAL HIGH (ref 70–99)
Glucose-Capillary: 111 mg/dL — ABNORMAL HIGH (ref 70–99)

## 2021-02-01 MED ORDER — LIP MEDEX EX OINT: TOPICAL_OINTMENT | CUTANEOUS | 0 refills | Status: DC | PRN

## 2021-02-01 MED ORDER — SODIUM CHLORIDE 0.45 % IV SOLN: INTRAVENOUS | Status: DC

## 2021-02-01 MED ORDER — ZINC OXIDE 40 % EX OINT: 1.0000 "application " | TOPICAL_OINTMENT | Freq: Three times a day (TID) | CUTANEOUS | 0 refills | Status: DC

## 2021-02-01 MED ORDER — LIDOCAINE 5 % EX PTCH: 1.0000 | MEDICATED_PATCH | CUTANEOUS | 0 refills | Status: DC

## 2021-02-01 NOTE — Evaluation (Signed)
Recreational Therapy Assessment and Plan  Patient Details  Name: Shelby Walker MRN: [id number] Date of Birth: [birth year] Today's Date: 02/01/2021  Rehab Potential:  Good ELOS:   d/c pending medical issues  Assessment   Hospital Problem: Principal Problem:   S/P transmetatarsal amputation of foot, right ([place])     Past Medical History:      Past Medical History:  Diagnosis Date   Bruit      Carotid Doppler showed no significant abnormality     9per patient)   C. difficile colitis 07/2018    with severe sepsis   Chest pain, unspecified      Nuclear, May, 2008, no scar or ischemia   Diabetes mellitus     Diverticulosis     Dyslipidemia     Ejection fraction      EF 55-60%, echo, February, 2011 // Echocardiogram 8/21: EF 60-65, no RWMA, Gr 1 DD, GLS -14%, normal RVSF, mild LAE, trivial MR, mild MS (mean 4 mmHg), RVSP 23.4    GERD (gastroesophageal reflux disease)     Mitral regurgitation April 21, 2009    mild,  echo, February, 2011   Osteoporosis     Palpitations      possible very brief atrial fibrillation on monitor and possible reentrant tachycardia   Psoriasis     Rheumatoid arthritis ([place])      Past Surgical History:       Past Surgical History:  Procedure Laterality Date   ABDOMINAL AORTOGRAM W/LOWER EXTREMITY Right 10/19/2020    Procedure: ABDOMINAL AORTOGRAM W/LOWER EXTREMITY;  Surgeon: Wellington Hampshire, MD;  Location: [facility];  Service: Cardiovascular;  Laterality: Right;   FRACTURE SURGERY       TRANSMETATARSAL AMPUTATION Right 01/18/2021    Procedure: TRANSMETATARSAL AMPUTATION;  Surgeon: Wylene Simmer, MD;  Location: [facility];  Service: Orthopedics;  Laterality: Right;      Assessment & Plan Clinical Impression: Shelby Walker is an 87 year old female with history of T2DM, c diff colitis w/sepsis, GERD, RA, CKD, PAD, A fib- on Eliquis , diabetic foot ulcer who was admitted on 12/16/20 with gangrenous changes with necrosis and purulent drainage. She was  started on broad spectrum antibiotics and underwent Left transmetatarsal amputation on 11/16 by Dr. Doran Durand. Post op to be WBAT in CAM boot and has had issues with pain control requiring IV morphine. Blood sugars have been labile but improving and ABLA being monitored. Therapy ongoing and patient noted to have limitations in mobility and ability to carry out ADLs. CIR recommended due to functional decline.  Patient transferred to CIR on 01/24/2021 .      Met with pt today to discuss TR services, activity analysis/modifications and stress management.  Pt presents with decreased activity tolerance, decreased functional mobility, decreased balance, decreased coordination, feelings of stress Limiting pt's independence with leisure/community pursuits.  Plan  Min 1 Session for stress management >45 minutes  Recommendations for other services: None   Discharge Criteria: Patient will be discharged from TR if patient refuses treatment 3 consecutive times without medical reason.  If treatment goals not met, if there is a change in medical status, if patient makes no progress towards goals or if patient is discharged from hospital.  The above assessment, treatment plan, treatment alternatives and goals were discussed and mutually agreed upon: by patient  Session note:   No c/o pain Pt actively participated in stress managment/coping group today.  Pt education/discussion focused on stress exploration including factors that  contribute to stress, factors that protect against stress and potential coping strategies.  Coping strategies included deep breathing, progressive muscle relaxation, imagery & challenging irrational thoughts.  Handouts provided.  Pt appreciative of this session, stating the information is helpful.  No further TR as pt is expected to discharge when medically ready.   [place] 02/01/2021, 4:05 PM

## 2021-02-01 NOTE — Progress Notes (Signed)
Called Dr. Nona Dell office again re: family request to have ortho evaluate wound prior to discharge.

## 2021-02-01 NOTE — Progress Notes (Signed)
Occupational Therapy Session Note  Patient Details  Name: Shelby Walker MRN: [id number] Date of Birth: 1933/03/26  Today's Date: 02/01/2021 OT Group Time: 1435-1535 OT Group Time Calculation (min): 60 min   Short Term Goals: Week 1:  OT Short Term Goal 1 (Week 1): STGs = LTGs 2/2 LOS at Mod I/Set up  Skilled Therapeutic Interventions/Progress Updates:  Pt participated in group session with a focus on stress mgmt, education on healthy coping strategies, and social interaction. Focus of session on providing coping strategies to manage new current level of function as a result of new diagnosis.  Session focus on breaking down stressors into "daily hassles," "major life stressors" and "life circumstances" in an effort to allow pts to chunk their stressors into groups. Pt actively sharing stressors during group such as the loss of her husband and her dog. Offered education on factors that protect [place] against stress such as "daily uplifts," "healthy coping strategies" and "protective factors." Encouraged all group members to share this information with their caregivers in order to increase caregiver communication and decrease caregiver burden of care. Issued pt handouts on healthy coping strategies to implement into routine. Pt transported back to room by RT.  Therapy Documentation Precautions:  Precautions Precautions: Fall Required Braces or Orthoses: Other Brace Other Brace: WBAT RLE with CAM boot Restrictions Weight Bearing Restrictions: Yes RLE Weight Bearing: Weight bearing as tolerated Other Position/Activity Restrictions: CAM boot when OOB and when WB RLE  Pain: no pain reported during session     Therapy/Group: Group Therapy  Precious Haws 02/01/2021, 4:15 PM

## 2021-02-01 NOTE — Progress Notes (Signed)
Occupational Therapy Session Note  Patient Details  Name: Shelby Walker MRN: [id number] Date of Birth: 02/17/1934  Today's Date: 02/01/2021 OT Individual Time: 2482-5003 OT Individual Time Calculation (min): 75 min    Short Term Goals: Week 1:  OT Short Term Goal 1 (Week 1): STGs = LTGs 2/2 LOS at Mod I/Set up   Skilled Therapeutic Interventions/Progress Updates:    Pt greeted at time of session semireclined in bed resting with nursing present performing med pass. Pt emotional at beginning of session stating she felt "not ready" to go home, feels like "nothing is going right" and emotional support provided. Reassured pt of her progress and how well she is doing in therapy sessions. Discussed DC and relayed concerns to nursing and MD. Pt agreeable to taking a shower this am, supine > sit Mod I and assist donning CAM boot for time conservation. Ambulated to/from bathroom with RW with Supervision and transferred in to walk in shower same manner. Doffed boot and LB clothing seated. OT covering RLE CAM boot for waterproofing for shower. Pt performing UB/LB bathing with set up/supervision. Dried off seated same manner. Ambulated out to bed level with CGA/Supervision to wheelchair level, UB dress for bra and shirt with set up. LB dress with underwear and pants with Supervision/CGA for standing balance. Set up at sink with alarm on and phone in reach, NT aware pt at sink finishing brushing her teeth.    Therapy Documentation Precautions:  Precautions Precautions: Fall Required Braces or Orthoses: Other Brace Other Brace: WBAT RLE with CAM boot Restrictions Weight Bearing Restrictions: Yes RLE Weight Bearing: Weight bearing as tolerated Other Position/Activity Restrictions: CAM boot when OOB and when WB RLE     Therapy/Group: Individual Therapy  Viona Gilmore 02/01/2021, 7:15 AM

## 2021-02-01 NOTE — Progress Notes (Signed)
Physical Therapy Session Note  Patient Details  Name: Shelby Walker MRN: [id number] Date of Birth: [birth year]  Today's Date: 02/01/2021 PT Individual Time: 1305-1420 PT Individual Time Calculation (min): 75 min   Short Term Goals: Week 1:  PT Short Term Goal 1 (Week 1): =LTG due to ELOS  Skilled Therapeutic Interventions/Progress Updates: Pt presented in bed agreeable to therapy. Pt states pain 4/10 and increased with weight bearing. Pt states that CAM boot has been more painful in past days. Pt performed supine to sit with supervision and use of bed features. PTA donned CAM boot total A and pt's son assisting with donning sneaker. Pt requesting to use bathroom prior to leaving room. Pt ambulated to toilet with RW and CGA and performed toilet transfers and LB clothing management with supervision. Pt then ambulated to sink with RW and CGA then returned to w/c once completed. Pt indicating boot hurting across talocrural joint particularly where strap is. PTA inspected boot and noted that insert seemed to be more forward. PTA removed and adjust boot and reapplied. Pt then ambulated ~2ft with pt stating improvement in pain across that area. Pt then transported to main hallway and participated in obstacle courses including weaving through cones, stepping over thresholds, ambulating on compliant surfaces, and performing step ups. Pt performed at overall CGA requiring cues for maintaining close proximity to RW particularly during turns. Pt was able to improve with repeated cues and demo'd improved carryover through remainder of session. Pt's son then requesting to practice step for home entry. PTA reviewed mock set up and practiced with son on L hand side providing HHA. Pt was able to complete x 4 times overall demonstrating fair safety. Pt then ambulated to ortho gym at end of session with son guarding and demonstrating good safety. Pt left in w/c at end of session in ortho gym with son for group session.        Therapy Documentation Precautions:  Precautions Precautions: Fall Required Braces or Orthoses: Other Brace Other Brace: WBAT RLE with CAM boot Restrictions Weight Bearing Restrictions: Yes RLE Weight Bearing: Weight bearing as tolerated Other Position/Activity Restrictions: CAM boot when OOB and when WB RLE General:   Vital Signs: Therapy Vitals Temp: 98.5 F (36.9 C) Pulse Rate: 76 Resp: 16 BP: (!) 122/55 Patient Position (if appropriate): Lying Oxygen Therapy SpO2: 100 % O2 Device: Room Air Pain: Pain Assessment Pain Scale: 0-10 Pain Score: 0-No pain Mobility:   Locomotion :    Trunk/Postural Assessment :    Balance:   Exercises:   Other Treatments:      Therapy/Group: Individual Therapy  Jaxten Brosh 02/01/2021, 4:09 PM

## 2021-02-01 NOTE — Progress Notes (Signed)
PROGRESS NOTE   Subjective/Complaints:  Pt states R lateral prox foot pain increased over the last couple days, concerned about upcoming d/c , also discussed intake and elevated BUN/Creat   ROS:  Pt denies SOB, abd pain, CP, N/V/(+)C/D, and vision changes   Objective:   No results found. Recent Labs    01/30/21 0535  WBC 6.6  HGB 7.8*  HCT 25.3*  PLT 269     Recent Labs    02/01/21 0507  NA 140  K 4.6  CL 102  CO2 30  GLUCOSE 163*  BUN 68*  CREATININE 1.47*  CALCIUM 8.8*     Intake/Output Summary (Last 24 hours) at 02/01/2021 0859 Last data filed at 02/01/2021 0745 Gross per 24 hour  Intake 600 ml  Output --  Net 600 ml         Physical Exam: Vital Signs Blood pressure (!) 146/57, pulse 84, temperature 99 F (37.2 C), temperature source Oral, resp. rate 16, height 5\' 1"  (1.549 m), weight 56.9 kg, SpO2 98 %.   General: No acute distress Mood and affect are appropriate Heart: Regular rate and rhythm no rubs murmurs or extra sounds Lungs: Clear to auscultation, breathing unlabored, no rales or wheezes Abdomen: Positive bowel sounds, soft nontender to palpation, nondistended Extremities: No clubbing, cyanosis, or edema    Skin:- R TMA with ACE wrap over dressing- no change Musculoskeletal:     Cervical back: Normal range of motion. L shoulder- can lift arm in flexion to ~ 85 degrees and abduction to 90 degrees- is TTP over biceps tendon origin.     Comments: UE strength 5-/5- however has severe RA chronic changes/ulnar deviation in hands B/L with B/L Hand atrophy  RLE- HF/KE/KF 5-/5; NT DF/PF due to CAM boot/pain and TMA LLE- 5-/5  R TMA dressed in ACE wrap; with CAM boot- mild ot moderate swelling- per surgeon to not take off surgical dressing til 2+ weeks when they will remove staples.   Skin:    Comments: Ecchymoses in UE B/L and LLE notable IV L forearm- looks OK No skin changes on  backside seen- blanchable redness; bony coccyx L heel slightly boggy- no skin breakdown  Neurological:     Comments: Intact to light touch in UE B/L  LLE- intact to L knee and RLE intact to knee- decreased below knee B/L   Psychiatric:        Behavior: Behavior normal.     Comments: HOH    Assessment/Plan: 1. Functional deficits which require 3+ hours per day of interdisciplinary therapy in a comprehensive inpatient rehab setting. Physiatrist is providing close team supervision and 24 hour management of active medical problems listed below. Physiatrist and rehab team continue to assess barriers to discharge/monitor patient progress toward functional and medical goals  Care Tool:  Bathing    Body parts bathed by patient: Right arm, Left arm, Chest, Abdomen, Front perineal area, Right upper leg, Left upper leg, Face, Buttocks, Left lower leg   Body parts bathed by helper: Left lower leg, Buttocks Body parts n/a: Right lower leg   Bathing assist Assist Level: Supervision/Verbal cueing     Upper Body Dressing/Undressing Upper body  dressing   What is the patient wearing?: Pull over shirt    Upper body assist Assist Level: Set up assist    Lower Body Dressing/Undressing Lower body dressing      What is the patient wearing?: Pants     Lower body assist Assist for lower body dressing: Contact Guard/Touching assist     Toileting Toileting    Toileting assist Assist for toileting: Minimal Assistance - Patient > 75%     Transfers Chair/bed transfer  Transfers assist     Chair/bed transfer assist level: Supervision/Verbal cueing     Locomotion Ambulation   Ambulation assist      Assist level: Supervision/Verbal cueing Assistive device: Walker-rolling Max distance: 200 ft   Walk 10 feet activity   Assist     Assist level: Supervision/Verbal cueing Assistive device: Walker-rolling   Walk 50 feet activity   Assist Walk 50 feet with 2 turns activity  did not occur: Safety/medical concerns  Assist level: Supervision/Verbal cueing Assistive device: Walker-rolling    Walk 150 feet activity   Assist Walk 150 feet activity did not occur: Safety/medical concerns  Assist level: Supervision/Verbal cueing Assistive device: Walker-rolling    Walk 10 feet on uneven surface  activity   Assist Walk 10 feet on uneven surfaces activity did not occur: Safety/medical concerns   Assist level: Contact Guard/Touching assist Assistive device: Walker-rolling   Wheelchair     Assist Is the patient using a wheelchair?: Yes Type of Wheelchair: Manual    Wheelchair assist level: Dependent - Patient 0% Max wheelchair distance: 150'    Wheelchair 50 feet with 2 turns activity    Assist        Assist Level: Dependent - Patient 0%   Wheelchair 150 feet activity     Assist      Assist Level: Dependent - Patient 0%   Blood pressure (!) 146/57, pulse 84, temperature 99 F (37.2 C), temperature source Oral, resp. rate 16, height 5\' 1"  (1.549 m), weight 56.9 kg, SpO2 98 %.  Medical Problem List and Plan: 1. 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  Antithrombotics: -DVT/anticoagulation:  Pharmaceutical: Other (comment)--on Eliquis             -antiplatelet therapy: N/a 3. Pain Management: Using tramadol qid prn with occasional oxycodone prn. Also has voltaren gel and lidoderm for L shoulder pain  11/24- pain controlled when takes meds- encouraged her to ask for them- con't regimen  11/25- will look at doing steroid injection next Wednesday if need be; won't do today because want to see if will get better- since is diabetic- trying to avoid elevated  BG's.   11/26: scheduled gabapentin 100mg  TID. Replaced prn oxycodone with scheduled Tylenol #3 TID as patient took previously.   11/28- pt reports pain much better 2/10 this AM- con't regimen  11/29- pain doing better even in L  shoulder- since CBGs been elevated, will wait on steroid injection.  11/30 pt c/o increased pain Right lateral residual foot - discussed ortho plan of seeing pt in clinic to do first dressing change  Spoke with Dr Hewitt's PA who will see pt in am , informed pt who was reassured              4. Mood: LCSW to follow for evaluation and support.              -antipsychotic agents: N/A 5. Neuropsych: This patient is capable of making decisions on her own behalf. 6. Skin/Wound Care: Monitor wound for healing. Has low protein stores--alb-2.4             --add Juven to supplement low calorie malnutrition             --contacted Dr. Doran Durand re: dressing change/orders. Was informed dressing needs to stay on till follow up in office. Per Ortho surgeon 7. Fluids/Electrolytes/Nutrition: Monitor I/O. Check CMET in am.  8. A fib: Monitor HR TID-- on Coreg BID w/Eliquis  11/28- rate controlled- con't regimen 9. T2DM: Hgb A1C-  continue insulin Glargine with SSI.   CBG (last 3)  Recent Labs    01/31/21 1648 01/31/21 2053 02/01/21 0736  GLUCAP 162* 133* 202*  Good in hospital control   10. Acute blood loss anemia: last CBC 7.8 on 11/28 repeat in am   11. Chemo/Diabetes induced peripheral neuropathy: Managed with Neurontin  12. RA: On prednisone with monthly infusion of DMARD.  13. Right knee pain: Will add Voltaren gel qid to knee. 14. Left bicipital tendinitis: Continue lidocaine patch to shoulder. If pain not better by Friday, will do steroid injection as long as BG's controlled  11/29- pain/ROM doing better- wait on Steroid injection 15. Chronic angina: SL nitro ordered PRN.  16. SBP high and DBP low: continued to monitor TID Vitals:   01/31/21 2105 02/01/21 0435  BP:  (!) 110/51 (!) 146/57  Pulse: 77 84  Resp: 14 16  Temp: 99 F (37.2 C) 99 F (37.2 C)  SpO2: 98% 98%    17. Constipation  11/29- LBM 2 days ago- feels constipated- will order Sorbitol at 3pm, if doesn't go.     18.  Pre renal azotemia - intake po has been in 600-700 ml per day range , IVF at noc , reviewed meds no nephrotoxic meds so this is likely intake related   LOS: 8 days A FACE TO FACE EVALUATION WAS PERFORMED  Charlett Blake 02/01/2021, 8:59 AM

## 2021-02-01 NOTE — Progress Notes (Addendum)
Patient ID: Shelby Walker, female   DOB: 02/09/1934, 87 y.o.   MRN: [id number] According to MD covering today pt will not be medically ready for discharge tomorrow due to needing IVF and wants ortho to see prior to discharging home for foot. Team made aware and family along with pt also.  2:30 PM Daughter reports the PA for ortho MD will be in tomorrow between 7:30-8:00 to look at pt's foot. Made aware Dr Dagoberto Ligas will make decision regarding medical readiness for discharge.

## 2021-02-01 NOTE — Progress Notes (Signed)
Blood sugar checked after patient ate 100% of breakfast, fsbs 202. Standard 3 units given

## 2021-02-01 NOTE — Plan of Care (Signed)
  Problem: Consults Goal: RH LIMB LOSS PATIENT EDUCATION Description: Description: See Patient Education module for eduction specifics. Outcome: Progressing Goal: Skin Care Protocol Initiated - if Braden Score 18 or less Description: If consults are not indicated, leave blank or document N/A Outcome: Progressing Goal: Diabetes Guidelines if Diabetic/Glucose > 140 Description: If diabetic or lab glucose is > 140 mg/dl - Initiate Diabetes/Hyperglycemia Guidelines & Document Interventions  Outcome: Progressing   Problem: RH SKIN INTEGRITY Goal: RH STG MAINTAIN SKIN INTEGRITY WITH ASSISTANCE Description: STG Maintain Skin Integrity With supervision Assistance. Outcome: Progressing Goal: RH STG ABLE TO PERFORM INCISION/WOUND CARE W/ASSISTANCE Description: STG Able To Perform Incision/Wound Care With supervision Assistance. Outcome: Progressing   Problem: RH SAFETY Goal: RH STG ADHERE TO SAFETY PRECAUTIONS W/ASSISTANCE/DEVICE Description: STG Adhere to Safety Precautions With cues and reminders. Outcome: Progressing Goal: RH STG DECREASED RISK OF FALL WITH ASSISTANCE Description: STG Decreased Risk of Fall With supervision Assistance. Outcome: Progressing   Problem: RH PAIN MANAGEMENT Goal: RH STG PAIN MANAGED AT OR BELOW PT'S PAIN GOAL Description: < 3 on a 0-10 pain scale. Outcome: Progressing   Problem: RH KNOWLEDGE DEFICIT LIMB LOSS Goal: RH STG INCREASE KNOWLEDGE OF SELF CARE AFTER LIMB LOSS Description: Patient will demonstrate knowledge of medication/pain management, skin/wound care, and weight bearing precautions with educational materials and handouts provided by staff independently at discharge. Outcome: Progressing

## 2021-02-01 NOTE — Progress Notes (Signed)
Inpatient Rehabilitation Discharge Medication Review by a Pharmacist  A complete drug regimen review was completed for this patient to identify any potential clinically significant medication issues.  High Risk Drug Classes Is patient taking? Indication by Medication  Antipsychotic No   Anticoagulant Yes Apixaban, Afib  Antibiotic No   Opioid Yes Tylenol w/ codeine #3, tramadol for pain  Antiplatelet No   Hypoglycemics/insulin Yes SSI, Semglee for DM  Vasoactive Medication Yes Carvedilol for BP, Afib  Chemotherapy No   Other No      Type of Medication Issue Identified Description of Issue Recommendation(s)  Drug Interaction(s) (clinically significant)     Duplicate Therapy     Allergy     No Medication Administration End Date     Incorrect Dose     Additional Drug Therapy Needed     Significant med changes from prior encounter (inform family/care partners about these prior to discharge).    Other       Clinically significant medication issues were identified that warrant physician communication and completion of prescribed/recommended actions by midnight of the next day:  No  Pharmacist comments: Resume supplements as discharge  Time spent performing this drug regimen review (minutes):  20 minutes   Aditri Louischarles BS, PharmD, BCPS Clinical Pharmacist 02/01/2021 8:08 AM

## 2021-02-02 ENCOUNTER — Other Ambulatory Visit: Payer: Self-pay

## 2021-02-02 LAB — BASIC METABOLIC PANEL
Anion gap: 6 (ref 5–15)
BUN: 51 mg/dL — ABNORMAL HIGH (ref 8–23)
CO2: 29 mmol/L (ref 22–32)
Calcium: 8.3 mg/dL — ABNORMAL LOW (ref 8.9–10.3)
Chloride: 105 mmol/L (ref 98–111)
Creatinine, Ser: 1.39 mg/dL — ABNORMAL HIGH (ref 0.44–1.00)
GFR, Estimated: 37 mL/min — ABNORMAL LOW (ref >60)
Glucose, Bld: 124 mg/dL — ABNORMAL HIGH (ref 70–99)
Potassium: 4.5 mmol/L (ref 3.5–5.1)
Sodium: 140 mmol/L (ref 135–145)

## 2021-02-02 LAB — CBC
HCT: 27.2 % — ABNORMAL LOW (ref 36.0–46.0)
Hemoglobin: 8.7 g/dL — ABNORMAL LOW (ref 12.0–15.0)
MCH: 29.9 pg (ref 26.0–34.0)
MCHC: 32 g/dL (ref 30.0–36.0)
MCV: 93.5 fL (ref 80.0–100.0)
Platelets: 270 10*3/uL (ref 150–400)
RBC: 2.91 MIL/uL — ABNORMAL LOW (ref 3.87–5.11)
RDW: 14.2 % (ref 11.5–15.5)
WBC: 6.9 10*3/uL (ref 4.0–10.5)
nRBC: 0 % (ref 0.0–0.2)

## 2021-02-02 LAB — GLUCOSE, CAPILLARY
Glucose-Capillary: 111 mg/dL — ABNORMAL HIGH (ref 70–99)
Glucose-Capillary: 276 mg/dL — ABNORMAL HIGH (ref 70–99)
Glucose-Capillary: 166 mg/dL — ABNORMAL HIGH (ref 70–99)
Glucose-Capillary: 94 mg/dL (ref 70–99)

## 2021-02-02 MED ORDER — APIXABAN 2.5 MG PO TABS: ORAL_TABLET | ORAL | 1 refills | Status: DC

## 2021-02-02 MED ORDER — SODIUM CHLORIDE 0.45 % IV SOLN: INTRAVENOUS | Status: DC

## 2021-02-02 NOTE — Telephone Encounter (Signed)
Prescription refill request for Eliquis received. Indication:Afib  Last office visit:10/11/20 (Arida) Scr: 1.39 (02/02/21) Age: 87 Weight: 56.9kg  Appropriate dose and refill sent to requested pharmacy.

## 2021-02-02 NOTE — Progress Notes (Signed)
Subjective:     Patient reports pain as mild.  Denies fever, chills, N/V, CP, SOB.  Reports that she has been ambulating in the post-op shoe on her heel.  She has questions and concerns in regards to her progress.  Daughter at bedside.  Daughter informs me that intra-op dressing has been removed since arriving at rehab.  Objective:   VITALS:  Temp:  [97.7 F (36.5 C)-98.5 F (36.9 C)] 97.7 F (36.5 C) (12/01 0454) Pulse Rate:  [71-76] 71 (12/01 0454) Resp:  [16-17] 17 (12/01 0454) BP: (122-157)/(55-64) 142/64 (12/01 0454) SpO2:  [96 %-100 %] 96 % (12/01 0454)  General: WDWN patient in NAD. Psych:  Appropriate mood and affect. Neuro:  A&O x 3, Moving all extremities, sensation intact to light touch HEENT:  EOMs intact Chest:  Even non-labored respirations Skin:  Sutures are intact.  Incision site is healing.  There is evidence of scant sanguinous drainage central within the incision site, no rashes or lesions Extremities: warm/dry, mild edema, no erythema or echymosis.  No lymphadenopathy. Pulses: Popliteus 2+ MSK:  ROM: Full R ankle ROM, MMT: able to perform quad set   LABS Recent Labs    02/02/21 0536  HGB 8.7*  WBC 6.9  PLT 270   Recent Labs    02/01/21 0507  NA 140  K 4.6  CL 102  CO2 30  BUN 68*  CREATININE 1.47*  GLUCOSE 163*   No results for input(s): LABPT, INR in the last 72 hours.   Assessment/Plan:    2 week S/P R transmetatarsal amputation.  I removed the dressing and evaluated the wound.  Informed the patient and daughter that she is demonstrating appropriate healing.  Sutures left intact.  WBAT R LE on heel in flat post-op shoe.  Taught daily dressing changes.  Patient may shower (using shower chair) utilizing soap and water, pat dray.  Follow up in 2 weeks for outpatient post-op visit.  Patient is stable from ortho perspective.  May D/C home once stable per Rehab team.  Mechele Claude PA-C [facility] Office:  [phone]

## 2021-02-02 NOTE — Progress Notes (Signed)
Physical Therapy Session Note  Patient Details  Name: Shelby Walker MRN: [id number] Date of Birth: 01/24/1934  Today's Date: 02/02/2021 PT Individual Time: 3009-2330 PT Individual Time Calculation (min): 30 min   Short Term Goals: Week 2:     Skilled Therapeutic Interventions/Progress Updates: Pt presented in recliner agreeable to therapy. Pt noted to be in some emotional distress. Upon inquiry pt indicated that she was not discharging tomorrow and unsure of d/c plan. Pt feels that she is burden on family and additional stress due to daughter sick. Spoke with MD who confirmed pt will d/c either Sat or Mon based on labs. Updated primary therapists. PTA returned and provided emotional support regarding family and explained that she is functioning a higher level and currently does not require any physical assist. PTA provided distraction and inquired about CAM boot and foot. Pt expressed continued pain also talo crural joint line. Explained continues to have swelling in area and pressing on nerves, pt requesting to have ace bandage re-wrapped as feels it is too tight. PTA re-wrapped and noted some indentation due to folded ace bandages. Upon re-wrapping pt indicated did feel better. Pt donned CAM boot with supervision and was able to complete all straps with supervision. Pt then ambulated to rehab gym and then returned to room with overall supervision. Pt demonstrated fair safety and good cadence overall. Pt left sitting EOB per pt request. Pt left with bed alarm on, call bell within reach and needs met.      Therapy Documentation Precautions:  Precautions Precautions: Fall Required Braces or Orthoses: Other Brace Other Brace: WBAT RLE with CAM boot Restrictions Weight Bearing Restrictions: Yes RLE Weight Bearing: Weight bearing as tolerated Other Position/Activity Restrictions: CAM boot when OOB and when WB RLE General:   Vital Signs: Therapy Vitals Temp: 98.5 F (36.9 C) Temp Source:  Oral Pulse Rate: 78 Resp: 16 BP: 130/74 Patient Position (if appropriate): Sitting Oxygen Therapy SpO2: 97 % O2 Device: Room Air Pain:   Mobility:   Locomotion :    Trunk/Postural Assessment :    Balance:   Exercises:   Other Treatments:      Therapy/Group: Individual Therapy  Rubert Frediani 02/02/2021, 4:23 PM

## 2021-02-02 NOTE — Progress Notes (Signed)
Patient ID: Shelby Walker, female   DOB: [birth year], 87 y.o.   MRN: [id number]  met with pt who reports the ortho PA was in and looked at foot and made her feel better. Daughter was here and talked with MD also. Aware will re-check labs tomorrow and decide when medically ready for discharge. Pt wants to make sure she is ready and feels comfortable with going home.

## 2021-02-02 NOTE — Progress Notes (Signed)
Occupational Therapy Session Note  Patient Details  Name: Shelby Walker MRN: [id number] Date of Birth: [birth year]  Today's Date: 02/02/2021 OT Individual Time: 4098-1191 OT Individual Time Calculation (min): 73 min    Short Term Goals: Week 1:  OT Short Term Goal 1 (Week 1): STGs = LTGs 2/2 LOS at Mod I/Set up   Skilled Therapeutic Interventions/Progress Updates:    Pt greeted at time of session sitting EOB, pleasant and agreeable to OT session. No pain reported. Pt needing to toilet and hooked up to IV, OT assist managing IV pole throughout session. Ambulated bed > bathroom > sink with Supervision and RW. Toilet transfer and 3/3 toileting tasks with Supervision. Verbally reviewing with the pt that she is at goal level and providing encouragement in prep for DC home, pt stating she feels like she can perform self care tasks Mod I at home. Able to stand for hand hygiene and ambulate to wheelchair. Wheelchair transport to gym and focused on BUE (L>R) ROM with moist heat as a preparatory activity for 10 minutes, skin in tact pre and post. Pt completed the following: towel slides for shoulder flexion/extension, windshield wipers for horizontal abduction/adduction. Switched to holding towel for chest press, overhead raise, and ER/IR to maintain and promote shoulder ROM at home to decrease stiffness and promote function, good carryover noted. Transported back to room and set up alarm on call bell in reach.   Therapy Documentation Precautions:  Precautions Precautions: Fall Required Braces or Orthoses: Other Brace Other Brace: WBAT RLE with CAM boot Restrictions Weight Bearing Restrictions: Yes RLE Weight Bearing: Weight bearing as tolerated Other Position/Activity Restrictions: CAM boot when OOB and when WB RLE     Therapy/Group: Individual Therapy  Viona Gilmore 02/02/2021, 7:26 AM

## 2021-02-02 NOTE — Progress Notes (Signed)
Occupational Therapy Discharge Summary  Patient Details  Name: Shelby Walker MRN: [id number] Date of Birth: [birth year]   Patient has met 11 of 11 long term goals due to improved activity tolerance, improved balance, postural control, improved awareness, and improved coordination.  Patient to discharge at overall Modified Independent - occassional Supervision level.  Patient's care partner is independent to provide the necessary physical assistance at discharge.  Pt is Set up/Supervision with shower level bathing, daughter [name] has been trained on covering CAM boot for showers. Pt is Mod I for UB/LB dressing, and is able to don own CAM boot with foot propped up on a surface. Pt is Mod I with toileting, ambulating to/from bathroom using RW. Extensive problem solving for getting in/out of shower at home, practiced with daughter [name] but recommended if does not feel safe doing so can wait for [place]. Pt has made good progress, very motivated, and always participates.   All goals met  Recommendation:  Patient will benefit from ongoing skilled OT services in home health setting to continue to advance functional skills in the area of BADL, iADL, and Reduce care partner burden.  Equipment: No equipment provided already has shower seat and BSC  Reasons for discharge: treatment goals met and discharge from hospital  Patient/family agrees with progress made and goals achieved: Yes  OT Discharge Precautions/Restrictions  Precautions Precautions: Fall Required Braces or Orthoses: Other Brace Other Brace: WBAT RLE with CAM boot Restrictions Weight Bearing Restrictions: Yes RLE Weight Bearing: Weight bearing as tolerated Other Position/Activity Restrictions: CAM boot when OOB and when WB RLE Vital Signs Therapy Vitals Temp: 98.5 F (36.9 C) Temp Source: Oral Pulse Rate: 78 Resp: 16 BP: 130/74 Patient Position (if appropriate): Sitting Oxygen Therapy SpO2: 97 % O2 Device: Room Air Pain Pain  Assessment Pain Scale: 0-10 Pain Score: 0-No pain ADL ADL Eating: independent  Grooming: Modified independent Upper Body Bathing: Setup Where Assessed-Upper Body Bathing: shower Lower Body Bathing: Supervision/setup Where Assessed-Lower Body Bathing: shower Upper Body Dressing: Modified independent Where Assessed-Upper Body Dressing: edge of bed Lower Body Dressing: Modified independent Where Assessed-Lower Body Dressing: edge of bed Toileting: Modified independent Toilet Transfer: Modified independent Toilet Transfer Method: Ambulating Vision Baseline Vision/History: 1 Wears glasses Patient Visual Report: No change from baseline Vision Assessment?: No apparent visual deficits Perception  Perception: Within Functional Limits Praxis Praxis: Intact Cognition Overall Cognitive Status: Within Functional Limits for tasks assessed Arousal/Alertness: Awake/alert Orientation Level: Oriented X4 Awareness: Appears intact Problem Solving: Appears intact Safety/Judgment: Appears intact Sensation Sensation Light Touch: Appears Intact Proprioception: Appears Intact Coordination Gross Motor Movements are Fluid and Coordinated: No Fine Motor Movements are Fluid and Coordinated: Yes Coordination and Movement Description: grossly uncoordinated 2/2 RLE CAM boot and pain Motor  Motor Motor: Within Functional Limits Motor - Skilled Clinical Observations: arthritic changes limiting, decreased coorindation 2/2 CAM boot Mobility  Bed Mobility Supine to Sit: Independent with assistive device Sit to Supine: Independent with assistive device Transfers Sit to Stand: Independent with assistive device Stand to Sit: Independent with assistive device  Trunk/Postural Assessment  Cervical Assessment Cervical Assessment: Within Functional Limits Thoracic Assessment Thoracic Assessment: Exceptions to [facility] Lumbar Assessment Lumbar Assessment: Exceptions to [facility] Postural Control Postural Control:  Within Functional Limits  Balance Balance Balance Assessed: Yes Static Sitting Balance Static Sitting - Balance Support: Feet supported Static Sitting - Level of Assistance: 6: Modified independent (Device/Increase time) Dynamic Sitting Balance Dynamic Sitting - Balance Support: Feet supported Dynamic Sitting - Level of Assistance: 6: Modified  independent (Device/Increase time) Dynamic Sitting - Balance Activities: Lateral lean/weight shifting;Forward lean/weight shifting;Reaching for objects Static Standing Balance Static Standing - Balance Support: Bilateral upper extremity supported;During functional activity Static Standing - Level of Assistance: 6: Modified independent (Device/Increase time) Dynamic Standing Balance Dynamic Standing - Balance Support: During functional activity Dynamic Standing - Level of Assistance: 6: Modified independent (Device/Increase time) Extremity/Trunk Assessment RUE Assessment RUE Assessment: Exceptions to [facility] Strength Comments: weak and deconditioned, arthritic changes but functional for ADL LUE Assessment LUE Assessment: Exceptions to [facility] Strength Comments: weak and deconditioned, arthritic changes but functional for ADL. Pain in L shoulder improved.

## 2021-02-02 NOTE — Progress Notes (Signed)
Occupational Therapy Session Note  Patient Details  Name: Shelby Walker MRN: [id number] Date of Birth: [birth year]  Today's Date: 02/03/2021 OT Individual Time: 0900-1010 OT Individual Time Calculation (min): 70 min    Short Term Goals: Week 1:  OT Short Term Goal 1 (Week 1): STGs = LTGs 2/2 LOS at Mod I/Set up  Skilled Therapeutic Interventions/Progress Updates:    Pt greeted in the recliner with no c/o pain. Just finished her PT session. Pts ADL needs were already met however pt was agreeable to simulate ADLs to obtain caretool scores in prep for d/c (confirmed with SW, d/c is Monday). Shower and toilet transfer completed at ambulatory level using RW with Mod I. Pt reports that she already has a walker tray, cup holder, and walker bag at home to use during ADL/IADL routine. Pt able to direct care for wrapping her CAM boot in prep for showering. Completed simulated bathing sit<stand from shower chair. When she returned to EOB, pt able to doff/don footwear given increased time, able to utilize figure 4 for both LEs, also able to bend forward to manage the straps of her CAM boot without dizziness/nausea or pain. Discussed f/u and supervision for home, pt reports both her son and daughter live nearby and plan to check in on her/stay with her post hospitalization. Pt ambulated using RW at Mod I level to the [facility] elevators and then took a seated rest break. Pt able to return to the room with the same assistance. She remained sitting up, left with all needs within reach. Tx focus placed on d/c planning, ADL retraining, functional transfers, dynamic balance, and activity tolerance.   Therapy Documentation Precautions:  Precautions Precautions: Fall Required Braces or Orthoses: Other Brace Other Brace: WBAT RLE with CAM boot Restrictions Weight Bearing Restrictions: Yes RLE Weight Bearing: Non weight bearing Other Position/Activity Restrictions: CAM boot when OOB and when WB RLE Vital Signs: Therapy  Vitals Temp: 98.2 F (36.8 C) Temp Source: Oral Pulse Rate: 69 Resp: 17 BP: (!) 114/45 Patient Position (if appropriate): Sitting Oxygen Therapy SpO2: 99 % O2 Device: Room Air Pain: no c/o pain during tx   ADL: ADL Eating: Set up Grooming: Modified independent Upper Body Bathing: Setup Where Assessed-Upper Body Bathing: Sitting at sink Lower Body Bathing: Supervision/safety Where Assessed-Lower Body Bathing: Sitting at sink Upper Body Dressing: Setup Where Assessed-Upper Body Dressing: Sitting at sink Lower Body Dressing: Setup Where Assessed-Lower Body Dressing: Wheelchair Toileting: Modified independent Toilet Transfer: Modified independent Toilet Transfer Method: Ambulating  Therapy/Group: Individual Therapy  Jericca Russett A Jailey Booton 02/03/2021, 4:07 PM

## 2021-02-02 NOTE — Progress Notes (Signed)
Occupational Therapy Session Note  Patient Details  Name: Shelby Walker MRN: [id number] Date of Birth: March 02, 1934  Today's Date: 02/02/2021 OT Individual Time: 0900-1000 OT Individual Time Calculation (min): 60 min    Short Term Goals: Week 1:  OT Short Term Goal 1 (Week 1): STGs = LTGs 2/2 LOS at Mod I/Set up Week 2:    Week 3:     Skilled Therapeutic InPterventions/Progress Updates:    Patient in bed upon arrival, pt transfer from supine to EOB with CGA. Patient  MaxA for the application of CAM for functional transfer.  Patient transfer from sit to stand with CGA, pt transfer from EOB to w/c using RW for balance with CGA. Patient instructed on functional mobility for w/c level of function to complete BADL related task in toileting, the pt CGA for task initiation and completion. The pt was able to wash her LB and private area with s/u assist. Pt able to donning UB/LB garments with MinA for fasteners and bilateral hand coordination for managing clothing items.  The pt was able to transfer from sit to stand for donning LB clothing with CGA, she was able to donn the CAM with Min-ModA for placement and managing the fasteners.  The pt was able to come from w/c LOF for grooming task involving brushing her teeth with s/u assist.  The pt remained in her w/c with the bedside table and call light  in place with all additional needs addressed. The pt did report slight shld pain of 5 with activity on a 0-10 pain scale.      Therapy Documentation Precautions:  Precautions Precautions: Fall Required Braces or Orthoses: Other Brace Other Brace: WBAT RLE with CAM boot Restrictions Weight Bearing Restrictions: Yes RLE Weight Bearing: Weight bearing as tolerated Other Position/Activity Restrictions: CAM boot when OOB and when WB RLE General:   Vital Signs:  Pain:   Vision   Perception    Praxis   Balance   Exercises:   Other Treatments:     Therapy/Group: Individual Therapy  Yvonne Kendall 02/02/2021, 12:39 PM

## 2021-02-02 NOTE — Progress Notes (Signed)
Physical Therapy Session Note  Patient Details  Name: Shelby Walker MRN: [id number] Date of Birth: 22-Mar-1933  Today's Date: 02/02/2021 PT Individual Time: 1015-1056 PT Individual Time Calculation (min): 41 min   Short Term Goals: Week 1:  PT Short Term Goal 1 (Week 1): =LTG due to ELOS  Skilled Therapeutic Interventions/Progress Updates:    Pt received in recliner and agreeable to therapy.  Pt reports some discomfort in her lateral foot and ankle throughout session, premedicated. After performing stairs reports 10/10 pain. When educated on top end of scale meaning calling an ambulance in OP setting, pt then reports 9/10. Pt did not display other acute signs or symptoms of physical distress at this time. Pain addressed with rest breaks and positioning PRN.   Pt performed transfers and gait up to 200 ft with RW and distant supervision, mod I at times but required VC for safety at times when turning to sit.   Car transfer with supervision and assist to manage RW, VC for technique throughout.  Pt navigated 6" stairs x 12 with supervision. Reported pain at this time but agreeable to continue session after rest break.   Pt stood to decorate Christmas tree x 5 ornaments with no UE support at times, demonstrating reaching outside BOS and picking up a dropped ornament from the floor with supervision. Pt returned to room and sat in recliner with BLE elevated and CAM boot straps loosened for comfort. Pt was left with all needs in reach and alarm active.   Therapy Documentation Precautions:  Precautions Precautions: Fall Required Braces or Orthoses: Other Brace Other Brace: WBAT RLE with CAM boot Restrictions Weight Bearing Restrictions: Yes RLE Weight Bearing: Weight bearing as tolerated Other Position/Activity Restrictions: CAM boot when OOB and when WB RLE   Therapy/Group: Individual Therapy  Shelby Walker 02/02/2021, 3:56 PM

## 2021-02-02 NOTE — Progress Notes (Signed)
PROGRESS NOTE   Subjective/Complaints:  Pt reports pain doing a little better this AM.  Ortho came to see this AM- and was pleased with results.   Daughter here this AM and was able to speak with Ortho PA as well as here for family training.    Pt drinking more than did at home, but admits to drinking no more than 3-4 cups/water/day.   Explained needs to drink EIGHT cups of water/day.   BMP shows improvement in BUN from 68 to 51 and Cr 1.39 from 1.47-  But due to 12 hours of IVFs.    ROS:  Pt denies SOB, abd pain, CP, N/V/C/D, and vision changes    Objective:   No results found. Recent Labs    02/02/21 0536  WBC 6.9  HGB 8.7*  HCT 27.2*  PLT 270    Recent Labs    02/01/21 0507 02/02/21 0536  NA 140 140  K 4.6 4.5  CL 102 105  CO2 30 29  GLUCOSE 163* 124*  BUN 68* 51*  CREATININE 1.47* 1.39*  CALCIUM 8.8* 8.3*     Intake/Output Summary (Last 24 hours) at 02/02/2021 0850 Last data filed at 02/02/2021 0807 Gross per 24 hour  Intake 360 ml  Output --  Net 360 ml        Physical Exam: Vital Signs Blood pressure (!) 142/64, pulse 71, temperature 97.7 F (36.5 C), resp. rate 17, height 5\' 1"  (1.549 m), weight 56.9 kg, SpO2 96 %.    General: awake, alert, appropriate, sitting up in bed; 2 family members at bedside;   NAD HENT: conjugate gaze; oropharynx dry CV: regular rate; no JVD Pulmonary: CTA B/L; no W/R/R- good air movement GI: soft, NT, ND, (+)BS- hypoactive Psychiatric: appropriate- sweet; interactive Neurological: Ox3  Skin:- R TMA with ACE wrap over dressing- new ACE wrap in place Musculoskeletal:     Cervical back: Normal range of motion. L shoulder- can lift arm in flexion to ~ 85 degrees and abduction to 90 degrees- is TTP over biceps tendon origin.     Comments: UE strength 5-/5- however has severe RA chronic changes/ulnar deviation in hands B/L with B/L Hand atrophy  RLE-  HF/KE/KF 5-/5; NT DF/PF due to CAM boot/pain and TMA LLE- 5-/5  R TMA dressed in ACE wrap; with CAM boot- mild ot moderate swelling- per surgeon to not take off surgical dressing til 2+ weeks when they will remove staples.   Skin:    Comments: Ecchymoses in UE B/L and LLE notable IV L forearm- looks OK No skin changes on backside seen- blanchable redness; bony coccyx L heel slightly boggy- no skin breakdown  Neurological:     Comments: Intact to light touch in UE B/L  LLE- intact to L knee and RLE intact to knee- decreased below knee B/L   Psychiatric:        Behavior: Behavior normal.     Comments: HOH    Assessment/Plan: 1. Functional deficits which require 3+ hours per day of interdisciplinary therapy in a comprehensive inpatient rehab setting. Physiatrist is providing close team supervision and 24 hour management of active medical problems listed below. Physiatrist and rehab  team continue to assess barriers to discharge/monitor patient progress toward functional and medical goals  Care Tool:  Bathing    Body parts bathed by patient: Right arm, Left arm, Chest, Abdomen, Front perineal area, Right upper leg, Left upper leg, Face, Buttocks, Left lower leg   Body parts bathed by helper: Left lower leg, Buttocks Body parts n/a: Right lower leg   Bathing assist Assist Level: Supervision/Verbal cueing     Upper Body Dressing/Undressing Upper body dressing   What is the patient wearing?: Pull over shirt, Bra    Upper body assist Assist Level: Set up assist    Lower Body Dressing/Undressing Lower body dressing      What is the patient wearing?: Pants, Underwear/pull up     Lower body assist Assist for lower body dressing: Contact Guard/Touching assist     Toileting Toileting    Toileting assist Assist for toileting: Contact Guard/Touching assist     Transfers Chair/bed transfer  Transfers assist     Chair/bed transfer assist level: Supervision/Verbal cueing      Locomotion Ambulation   Ambulation assist      Assist level: Supervision/Verbal cueing Assistive device: Walker-rolling Max distance: 200 ft   Walk 10 feet activity   Assist     Assist level: Supervision/Verbal cueing Assistive device: Walker-rolling   Walk 50 feet activity   Assist Walk 50 feet with 2 turns activity did not occur: Safety/medical concerns  Assist level: Supervision/Verbal cueing Assistive device: Walker-rolling    Walk 150 feet activity   Assist Walk 150 feet activity did not occur: Safety/medical concerns  Assist level: Supervision/Verbal cueing Assistive device: Walker-rolling    Walk 10 feet on uneven surface  activity   Assist Walk 10 feet on uneven surfaces activity did not occur: Safety/medical concerns   Assist level: Contact Guard/Touching assist Assistive device: Walker-rolling   Wheelchair     Assist Is the patient using a wheelchair?: Yes Type of Wheelchair: Manual    Wheelchair assist level: Dependent - Patient 0% Max wheelchair distance: 150'    Wheelchair 50 feet with 2 turns activity    Assist        Assist Level: Dependent - Patient 0%   Wheelchair 150 feet activity     Assist      Assist Level: Dependent - Patient 0%   Blood pressure (!) 142/64, pulse 71, temperature 97.7 F (36.5 C), resp. rate 17, height 5\' 1"  (1.549 m), weight 56.9 kg, SpO2 96 %.  Medical Problem List and Plan: 1. 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  2.  Antithrombotics: -DVT/anticoagulation:  Pharmaceutical: Other (comment)--on Eliquis             -antiplatelet therapy: N/a 3. Pain Management: Using  tramadol qid prn with occasional oxycodone prn. Also has voltaren gel and lidoderm for L shoulder pain  11/26: scheduled gabapentin 100mg  TID. Replaced prn oxycodone with scheduled Tylenol #3 TID as patient took previously.   11/28- pt reports pain much better 2/10 this AM- con't regimen  11/29- pain doing better even in  L  shoulder- since CBGs been elevated, will wait on steroid injection.  11/30 pt c/o increased pain Right lateral residual foot - discussed ortho plan of seeing pt in clinic to do first dressing change Spoke with Dr Hewitt's PA who will see pt in am , informed pt who was reassured   12/1- pain a little better- con't regimen             4. Mood: LCSW to follow for evaluation and support.              -antipsychotic agents: N/A 5. Neuropsych: This patient is capable of making decisions on her own behalf. 6. Skin/Wound Care: Monitor wound for healing. Has low protein stores--alb-2.4             --add Juven to supplement low calorie malnutrition             --contacted Dr. Doran Durand re: dressing change/orders. Was informed dressing needs to stay on till follow up in office. Per Ortho surgeon  12/1- Ortho PA came to see- pt reassured.  7. Fluids/Electrolytes/Nutrition: Monitor I/O. Check CMET in am.  8. A fib: Monitor HR TID-- on Coreg BID w/Eliquis  11/28- rate controlled- con't regimen 9. T2DM: Hgb A1C-  continue insulin Glargine with SSI.   CBG (last 3)  Recent Labs    02/01/21 1648 02/01/21 2118 02/02/21 0546  GLUCAP 149* 111* 111*   12/1- CBGs controlled- con't regimen 10. Acute blood loss anemia: last CBC 7.8 on 11/28 repeat in am   11. Chemo/Diabetes induced peripheral neuropathy: Managed with Neurontin  12. RA: On prednisone with monthly infusion of DMARD.  13. Right knee pain: Will add Voltaren gel qid to knee. 14. Left bicipital tendinitis: Continue lidocaine patch to shoulder. If pain not better by Friday, will do steroid injection as long as BG's  controlled  11/29- pain/ROM doing better- wait on Steroid injection 15. Chronic angina: SL nitro ordered PRN.  16. SBP high and DBP low: continued to monitor TID Vitals:   02/01/21 2053 02/02/21 0454  BP: (!) 157/61 (!) 142/64  Pulse: 71 71  Resp: 17 17  Temp: 97.8 F (36.6 C) 97.7 F (36.5 C)  SpO2: 99% 96%    12/1- SBP high again last night- if still high tomorrow, will change meds up.  17. Constipation  11/29- LBM 2 days ago- feels constipated- will order Sorbitol at 3pm, if doesn't go.     18.  Acute renal insufficiency - intake po has been in 600-700 ml per day range , IVF at noc , reviewed meds no nephrotoxic meds so this is likely intake related   12/1- Cr 1.39 and BUN 51 down from 1.47 and BUN of 68- will give another 24 hours of 1/2 NS IVFs 75cc/hour since so dry and recheck in AM  LOS: 9 days A FACE TO FACE EVALUATION WAS PERFORMED  Shelby Walker 02/02/2021, 8:50 AM

## 2021-02-03 LAB — BASIC METABOLIC PANEL
Anion gap: 5 (ref 5–15)
BUN: 57 mg/dL — ABNORMAL HIGH (ref 8–23)
CO2: 27 mmol/L (ref 22–32)
Calcium: 8.1 mg/dL — ABNORMAL LOW (ref 8.9–10.3)
Chloride: 104 mmol/L (ref 98–111)
Creatinine, Ser: 1.21 mg/dL — ABNORMAL HIGH (ref 0.44–1.00)
GFR, Estimated: 44 mL/min — ABNORMAL LOW (ref >60)
Glucose, Bld: 122 mg/dL — ABNORMAL HIGH (ref 70–99)
Potassium: 3.9 mmol/L (ref 3.5–5.1)
Sodium: 136 mmol/L (ref 135–145)

## 2021-02-03 LAB — GLUCOSE, CAPILLARY
Glucose-Capillary: 120 mg/dL — ABNORMAL HIGH (ref 70–99)
Glucose-Capillary: 257 mg/dL — ABNORMAL HIGH (ref 70–99)
Glucose-Capillary: 77 mg/dL (ref 70–99)
Glucose-Capillary: 202 mg/dL — ABNORMAL HIGH (ref 70–99)

## 2021-02-03 MED ORDER — CARVEDILOL 6.25 MG PO TABS: 6.2500 mg | ORAL_TABLET | Freq: Two times a day (BID) | ORAL | Status: DC

## 2021-02-03 MED ORDER — SODIUM CHLORIDE 0.45 % IV SOLN: INTRAVENOUS | Status: AC

## 2021-02-03 MED ORDER — CARVEDILOL 6.25 MG PO TABS
6.2500 mg | ORAL_TABLET | Freq: Two times a day (BID) | ORAL | Status: DC
  Administered 2021-02-03 – 2021-02-06 (×6): 6.25 mg via ORAL
  Filled 2021-02-03 (×7): qty 1

## 2021-02-03 NOTE — Progress Notes (Signed)
Physical Therapy Weekly Progress Note  Patient Details  Name: Shelby Walker MRN: [id number] Date of Birth: 11-11-1933  Beginning of progress report period: January 25, 2021 End of progress report period: February 03, 2021  Today's Date: 02/03/2021 PT Individual Time: 0805-0850 PT Individual Time Calculation (min): 45 min   Patient has met 1 of 1 short term goals.  Pt on track to meet goals prior to d/c.   Patient continues to demonstrate the following deficits muscle weakness and therefore will continue to benefit from skilled PT intervention to increase functional independence with mobility.  Patient progressing toward long term goals..  Continue plan of care.  PT Short Term Goals Week 1:  PT Short Term Goal 1 (Week 1): =LTG due to ELOS Week 2:  PT Short Term Goal 1 (Week 2): =LTGs d/t ELOS  Skilled Therapeutic Interventions/Progress Updates:    Pt received in recliner and agreeable to therapy.  Pt reports greatly improved lateral foot pain, but discomfort in weightbearing under the ball of the foot. Rest breaks and positioning as needed. Sit to stand and transfers with supervision throughout session. Mod I at times, but occ demos unsafe technique when turning to sit.   Gait  300 ft to/from day room with supervision and RW, therapist managing IV pole. VC for walker proximity and upright posture.   Pt performed the following exercises to promote LE strength and endurance:  Nustep 3 x 4 min with 1 min rest break at level 3 for global strength and endurance Step taps on 6" step for coordination and improved stair technique Step ups with simulated threshold for strength and coordination in simulated home environment Elevated lunges for stability and LLE strength   Upon returning to room, pt sat on EOB laterally with RW still to the side of her body. Educated on importance of slowing down and appropriate technique for safety. Sit>supine mod I. Pt directed placement of pillows for comfort  and was left with all needs in reach and alarm active.   Pt missed 14 min of scheduled PT d/t fatigue.   Therapy Documentation Precautions:  Precautions Precautions: Fall Required Braces or Orthoses: Other Brace Other Brace: WBAT RLE with CAM boot Restrictions Weight Bearing Restrictions: Yes RLE Weight Bearing: Non weight bearing Other Position/Activity Restrictions: CAM boot when OOB and when WB RLE General: PT Amount of Missed Time (min): 14 Minutes PT Missed Treatment Reason: Patient fatigue    Therapy/Group: Individual Therapy  Mickel Fuchs 02/03/2021, 3:28 PM

## 2021-02-03 NOTE — Progress Notes (Addendum)
Physical Therapy Session Note  Patient Details  Name: Shelby Walker MRN: [id number] Date of Birth: [birth year]  Today's Date: 02/03/2021 PT Individual Time: 0805-0850 PT Individual Time Calculation (min): 45 min   Short Term Goals: Week 1:  PT Short Term Goal 1 (Week 1): =LTG due to ELOS  Skilled Therapeutic Interventions/Progress Updates: Pt presented in bed with NT present just waking up. NT assisting with setting up breakfast. Pt performed bed mobility Mod I with bed features and sat EOB to eat breaksfast. Pt donned dentures with set up. PTA briefly left room to allow pt to eat (~5 min). Upon PTA return pt completed meal (did not eat eggs) and requesting to use bathroom. Pt donned CAM boot with minA for time management and pt donned sock and shoe on LLE mod I. Pt then ambulated to toilet with supervision with PTA managing IV. Pt performed toilet transfers and LB clothing management with supervision. Once completed pt ambulated to sink for hand hygiene in same manner . Pt took brief rest in chair by sink then ambulated to rehab gym with supervision demonstrating fair safety with RW. Pt took brief seated rest then returned to room in same manner. Pt left in recliner at end of session with seat alarm on, call bell within reach and needs met.      Therapy Documentation Precautions:  Precautions Precautions: Fall Required Braces or Orthoses: Other Brace Other Brace: WBAT RLE with CAM boot Restrictions Weight Bearing Restrictions: Yes RLE Weight Bearing: Non weight bearing Other Position/Activity Restrictions: CAM boot when OOB and when WB RLE General: PT Amount of Missed Time (min): 14 Minutes PT Missed Treatment Reason: Patient fatigue Vital Signs: Therapy Vitals Temp: 98.2 F (36.8 C) Temp Source: Oral Pulse Rate: 69 Resp: 17 BP: (!) 114/45 Patient Position (if appropriate): Sitting Oxygen Therapy SpO2: 99 % O2 Device: Room Air Pain:   Mobility:   Locomotion :     Trunk/Postural Assessment :    Balance:   Exercises:   Other Treatments:      Therapy/Group: Individual Therapy  Dontrell Stuck 02/03/2021, 3:57 PM

## 2021-02-03 NOTE — Progress Notes (Addendum)
Patient ID: Shelby Walker, female   DOB: Jun 05, 1933, 87 y.o.   MRN: [id number] According to MD pt will be medically stable for discharge Monday 12/5. Will make Home Health agency aware. Pt and family aware

## 2021-02-03 NOTE — Progress Notes (Addendum)
Patient c/o Chest pain, states its the same as the chronic pain she gets at home. Patient describes it as sharp intermittent mid sternal pain that does not radiate. Denies nausea, indigestion. Vital signs 141/53 Hr 88 02 100% on room air, temp 99.5. Patient given nitroglycerin 0.4 SL tab times one dose.   Chest pain resolved, On call PA Reesa Chew notified. No additional orders. Will continue to monitor

## 2021-02-03 NOTE — Progress Notes (Signed)
PROGRESS NOTE   Subjective/Complaints:  Pt reports was getting up all night voiding- because of IVFs.  Had an episode last night where heartbeat was "hard" and painful- got NTG like she does at home and it worked.   Poor sleep.    ROS:  Pt denies SOB, abd pain, CP, N/V/C/D, and vision changes   Objective:   No results found. Recent Labs    02/02/21 0536  WBC 6.9  HGB 8.7*  HCT 27.2*  PLT 270    Recent Labs    02/02/21 0536 02/03/21 0620  NA 140 136  K 4.5 3.9  CL 105 104  CO2 29 27  GLUCOSE 124* 122*  BUN 51* 57*  CREATININE 1.39* 1.21*  CALCIUM 8.3* 8.1*     Intake/Output Summary (Last 24 hours) at 02/03/2021 0830 Last data filed at 02/03/2021 0346 Gross per 24 hour  Intake 1474.34 ml  Output --  Net 1474.34 ml        Physical Exam: Vital Signs Blood pressure (!) 141/53, pulse 88, temperature 99.5 F (37.5 C), resp. rate 17, height 5\' 1"  (1.549 m), weight 56.9 kg, SpO2 100 %.     General: awake, alert, appropriate, sleepy; laying supine in bed; NAD HENT: conjugate gaze; oropharynx moist CV: regular rate; irregular rhythm; no JVD Pulmonary: CTA B/L; no W/R/R- good air movement GI: soft, NT, ND, (+)BS Psychiatric: appropriate- interactive Neurological: Ox3; HOH Skin:- R TMA with ACE wrap over dressing- new ACE wrap in place Musculoskeletal:     Cervical back: Normal range of motion. L shoulder- can lift arm in flexion to ~ 85 degrees and abduction to 90 degrees- is TTP over biceps tendon origin.     Comments: UE strength 5-/5- however has severe RA chronic changes/ulnar deviation in hands B/L with B/L Hand atrophy  RLE- HF/KE/KF 5-/5; NT DF/PF due to CAM boot/pain and TMA LLE- 5-/5  R TMA dressed in ACE wrap; with CAM boot- mild ot moderate swelling- per surgeon to not take off surgical dressing til 2+ weeks when they will remove staples.   Skin:    Comments: Ecchymoses in UE B/L and LLE  notable IV L forearm- looks OK No skin changes on backside seen- blanchable redness; bony coccyx L heel slightly boggy- no skin breakdown  Neurological:     Comments: Intact to light touch in UE B/L  LLE- intact to L knee and RLE intact to knee- decreased below knee B/L   Psychiatric:        Behavior: Behavior normal.     Comments: HOH    Assessment/Plan: 1. Functional deficits which require 3+ hours per day of interdisciplinary therapy in a comprehensive inpatient rehab setting. Physiatrist is providing close team supervision and 24 hour management of active medical problems listed below. Physiatrist and rehab team continue to assess barriers to discharge/monitor patient progress toward functional and medical goals  Care Tool:  Bathing    Body parts bathed by patient: Right arm, Left arm, Chest, Abdomen, Front perineal area, Right upper leg, Left upper leg, Face, Buttocks, Left lower leg   Body parts bathed by helper: Left lower leg, Buttocks Body parts n/a: Right lower leg  Bathing assist Assist Level: Supervision/Verbal cueing     Upper Body Dressing/Undressing Upper body dressing   What is the patient wearing?: Pull over shirt, Bra    Upper body assist Assist Level: Set up assist    Lower Body Dressing/Undressing Lower body dressing      What is the patient wearing?: Pants, Underwear/pull up     Lower body assist Assist for lower body dressing: Contact Guard/Touching assist     Toileting Toileting    Toileting assist Assist for toileting: Supervision/Verbal cueing     Transfers Chair/bed transfer  Transfers assist     Chair/bed transfer assist level: Supervision/Verbal cueing     Locomotion Ambulation   Ambulation assist      Assist level: Supervision/Verbal cueing Assistive device: Walker-rolling Max distance: 200 ft   Walk 10 feet activity   Assist     Assist level: Supervision/Verbal cueing Assistive device: Walker-rolling    Walk 50 feet activity   Assist Walk 50 feet with 2 turns activity did not occur: Safety/medical concerns  Assist level: Supervision/Verbal cueing Assistive device: Walker-rolling    Walk 150 feet activity   Assist Walk 150 feet activity did not occur: Safety/medical concerns  Assist level: Supervision/Verbal cueing Assistive device: Walker-rolling    Walk 10 feet on uneven surface  activity   Assist Walk 10 feet on uneven surfaces activity did not occur: Safety/medical concerns   Assist level: Contact Guard/Touching assist Assistive device: Walker-rolling   Wheelchair     Assist Is the patient using a wheelchair?: No Type of Wheelchair: Manual    Wheelchair assist level: Dependent - Patient 0% Max wheelchair distance: 150'    Wheelchair 50 feet with 2 turns activity    Assist        Assist Level: Dependent - Patient 0%   Wheelchair 150 feet activity     Assist      Assist Level: Dependent - Patient 0%   Blood pressure (!) 141/53, pulse 88, temperature 99.5 F (37.5 C), resp. rate 17, height 5\' 1"  (1.549 m), weight 56.9 kg, SpO2 100 %.  Medical Problem List and Plan: 1. 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   12/2- BMP shows BUN still 57- will keep til Monday.  2.  Antithrombotics: -DVT/anticoagulation:  Pharmaceutical: Other (comment)--on Eliquis             -antiplatelet therapy: N/a 3. Pain Management: Using tramadol qid prn with occasional oxycodone prn. Also has voltaren gel and lidoderm for L shoulder pain  11/26: scheduled gabapentin 100mg  TID. Replaced prn oxycodone with scheduled Tylenol #3 TID as patient took previously.    11/28- pt reports pain much better 2/10 this AM- con't regimen  11/29- pain doing better even in L  shoulder- since CBGs been elevated, will wait on steroid injection.  11/30 pt c/o increased pain Right lateral residual foot - discussed ortho plan of seeing pt in clinic to do first dressing change Spoke with Dr Hewitt's PA who will see pt in am , informed pt who was reassured  12/1- pain a little better- con't regimen             4. Mood: LCSW to follow for evaluation and support.              -antipsychotic agents: N/A 5. Neuropsych: This patient is capable of making decisions on her own behalf. 6. Skin/Wound Care: Monitor wound for healing. Has low protein stores--alb-2.4             --add Juven to supplement low calorie malnutrition             --contacted Dr. Doran Durand re: dressing change/orders. Was informed dressing needs to stay on till follow up in office. Per Ortho surgeon  12/1- Ortho PA came to see- pt reassured.  7. Fluids/Electrolytes/Nutrition: Monitor I/O. Check CMET in am.  8. A fib: Monitor HR TID-- on Coreg BID w/Eliquis  11/28- rate controlled- con't regimen 9. T2DM: Hgb A1C-  continue insulin Glargine with SSI.   CBG (last 3)  Recent Labs    02/02/21 1638 02/02/21 2059 02/03/21 0606  GLUCAP 166* 94 120*   12/2- CBGs controlled- con't regimen 10. Acute blood loss anemia: last CBC 7.8 on 11/28 repeat in am   11. Chemo/Diabetes induced peripheral neuropathy: Managed with Neurontin  12. RA: On prednisone with monthly infusion of DMARD.  13. Right knee pain: Will add Voltaren gel qid to knee. 14. Left bicipital tendinitis: Continue lidocaine patch to shoulder. If pain not better by Friday, will do steroid injection as long as BG's controlled  11/29- pain/ROM doing better- wait on Steroid injection 15. Chronic angina: SL nitro ordered PRN.  16. SBP high and DBP low: continued to monitor TID Vitals:   02/02/21 1954 02/03/21 0345  BP: (!) 159/68 (!) 141/53  Pulse: 74  88  Resp: 16 17  Temp: 98 F (36.7 C) 99.5 F (37.5 C)  SpO2: 99% 100%    12/1- SBP high again last night- if still high tomorrow, will change meds up.  17. Constipation  11/29- LBM 2 days ago- feels constipated- will order Sorbitol at 3pm, if doesn't go.   12/2- Will increase Coreg to 6.25 mg BID for elevated BP- hasn't come down.     18.  Acute renal insufficiency - intake po has been in 600-700 ml per day range , IVF at noc , reviewed meds no nephrotoxic meds so this is likely intake related   12/1- Cr 1.39 and BUN 51 down from 1.47 and BUN of 68- will give another 24 hours of 1/2 NS IVFs 75cc/hour since so dry and recheck in AM  12/2- Cr down to 1.21 but BUN 57- will con't IVFs for 1 more day and recheck labs in AM- hopefully can improve kidney function-won't d/c Til Monday.   LOS: 10 days A FACE TO FACE EVALUATION WAS PERFORMED  Shelby Walker 02/03/2021, 8:30 AM

## 2021-02-04 ENCOUNTER — Inpatient Hospital Stay (HOSPITAL_COMMUNITY): Payer: Medicare Other

## 2021-02-04 DIAGNOSIS — R7309 Other abnormal glucose: Secondary | ICD-10-CM

## 2021-02-04 DIAGNOSIS — R509 Fever, unspecified: Secondary | ICD-10-CM

## 2021-02-04 DIAGNOSIS — G8918 Other acute postprocedural pain: Secondary | ICD-10-CM

## 2021-02-04 DIAGNOSIS — D62 Acute posthemorrhagic anemia: Secondary | ICD-10-CM

## 2021-02-04 LAB — CBC WITH DIFFERENTIAL/PLATELET
Abs Immature Granulocytes: 0.02 10*3/uL (ref 0.00–0.07)
Basophils Absolute: 0 10*3/uL (ref 0.0–0.1)
Basophils Relative: 1 %
Eosinophils Absolute: 0.1 10*3/uL (ref 0.0–0.5)
Eosinophils Relative: 2 %
HCT: 25.2 % — ABNORMAL LOW (ref 36.0–46.0)
Hemoglobin: 8 g/dL — ABNORMAL LOW (ref 12.0–15.0)
Immature Granulocytes: 0 %
Lymphocytes Relative: 24 %
Lymphs Abs: 1.8 10*3/uL (ref 0.7–4.0)
MCH: 29.9 pg (ref 26.0–34.0)
MCHC: 31.7 g/dL (ref 30.0–36.0)
MCV: 94 fL (ref 80.0–100.0)
Monocytes Absolute: 0.8 10*3/uL (ref 0.1–1.0)
Monocytes Relative: 10 %
Neutro Abs: 4.6 10*3/uL (ref 1.7–7.7)
Neutrophils Relative %: 63 %
Platelets: 250 10*3/uL (ref 150–400)
RBC: 2.68 MIL/uL — ABNORMAL LOW (ref 3.87–5.11)
RDW: 14.8 % (ref 11.5–15.5)
WBC: 7.3 10*3/uL (ref 4.0–10.5)
nRBC: 0 % (ref 0.0–0.2)

## 2021-02-04 LAB — BASIC METABOLIC PANEL
Anion gap: 10 (ref 5–15)
BUN: 53 mg/dL — ABNORMAL HIGH (ref 8–23)
CO2: 23 mmol/L (ref 22–32)
Calcium: 8 mg/dL — ABNORMAL LOW (ref 8.9–10.3)
Chloride: 104 mmol/L (ref 98–111)
Creatinine, Ser: 1.25 mg/dL — ABNORMAL HIGH (ref 0.44–1.00)
GFR, Estimated: 42 mL/min — ABNORMAL LOW (ref >60)
Glucose, Bld: 121 mg/dL — ABNORMAL HIGH (ref 70–99)
Potassium: 4.2 mmol/L (ref 3.5–5.1)
Sodium: 137 mmol/L (ref 135–145)

## 2021-02-04 LAB — URINALYSIS, ROUTINE W REFLEX MICROSCOPIC
Bacteria, UA: NONE SEEN
Bilirubin Urine: NEGATIVE
Glucose, UA: NEGATIVE mg/dL
Hgb urine dipstick: NEGATIVE
Ketones, ur: NEGATIVE mg/dL
Nitrite: NEGATIVE
Protein, ur: NEGATIVE mg/dL
Specific Gravity, Urine: 1.01 (ref 1.005–1.030)
pH: 5 (ref 5.0–8.0)

## 2021-02-04 LAB — GLUCOSE, CAPILLARY
Glucose-Capillary: 114 mg/dL — ABNORMAL HIGH (ref 70–99)
Glucose-Capillary: 269 mg/dL — ABNORMAL HIGH (ref 70–99)
Glucose-Capillary: 171 mg/dL — ABNORMAL HIGH (ref 70–99)
Glucose-Capillary: 113 mg/dL — ABNORMAL HIGH (ref 70–99)

## 2021-02-04 MED ORDER — SODIUM CHLORIDE 0.9 % IV SOLN: INTRAVENOUS | Status: DC

## 2021-02-04 NOTE — Progress Notes (Signed)
Patient's vitals MEWS of 2. MD notified with orders for blood culture, Chest x-ray and ua/cs.Patient denies any pain or shortness of breath. MD also requested RN to check on patient's right foot amputation. Incision looks clean, dry and intact. No redness around the incision site.

## 2021-02-04 NOTE — Progress Notes (Signed)
PROGRESS NOTE   Subjective/Complaints: Patient seen sitting up in bed this morning.  She states she slept well overnight.  Overnight, called by nursing regarding fever.  Patient notes that she felt some chills but otherwise has not significantly symptomatic.  She denies complaints this AM.  Work-up initiated.  ROS: Denies CP, SOB, N/V/D  Objective:   DG Chest 2 View  Result Date: 02/04/2021 CLINICAL DATA:  Fever EXAM: CHEST - 2 VIEW COMPARISON:  09/01/2018 FINDINGS: No new consolidation or edema. No pleural effusion. Stable cardiomediastinal contours with normal heart size. No acute osseous abnormality. IMPRESSION: No acute process in the chest. Electronically Signed   By: Macy Mis M.D.   On: 02/04/2021 09:39   Recent Labs    02/02/21 0536  WBC 6.9  HGB 8.7*  HCT 27.2*  PLT 270     Recent Labs    02/03/21 0620 02/04/21 0554  NA 136 137  K 3.9 4.2  CL 104 104  CO2 27 23  GLUCOSE 122* 121*  BUN 57* 53*  CREATININE 1.21* 1.25*  CALCIUM 8.1* 8.0*      Intake/Output Summary (Last 24 hours) at 02/04/2021 1132 Last data filed at 02/03/2021 1846 Gross per 24 hour  Intake 472 ml  Output --  Net 472 ml         Physical Exam: Vital Signs Blood pressure (!) 124/46, pulse 83, temperature 99.5 F (37.5 C), resp. rate 15, height 5\' 1"  (1.549 m), weight 56.9 kg, SpO2 96 %. Constitutional: No distress . Vital signs reviewed. HENT: Normocephalic.  Atraumatic. Eyes: EOMI. No discharge. Cardiovascular: No JVD.  Irregularly irregular Respiratory: Normal effort.  No stridor.  Bilateral clear to auscultation. GI: Non-distended.  BS +. Skin: Warm and dry.  Right TMA with sutures and dried blood.  No warmth, edema, induration. Psych: Normal mood.  Normal behavior. Musc: No edema in extremities.  No tenderness in extremities. Neuro: Alert HOH Motor: UE strength 5-/5- however has severe RA chronic changes/ulnar  deviation in hands B/L with B/L Hand atrophy, unchanged RLE- HF/KE/KF 5-/5; NT DF/PF due to CAM boot/pain and TMA LLE- 5-/5   Assessment/Plan: 1. Functional deficits which require 3+ hours per day of interdisciplinary therapy in a comprehensive inpatient rehab setting. Physiatrist is providing close team supervision and 24 hour management of active medical problems listed below. Physiatrist and rehab team continue to assess barriers to discharge/monitor patient progress toward functional and medical goals  Care Tool:  Bathing    Body parts bathed by patient: Right arm, Left arm, Chest, Abdomen, Front perineal area, Right upper leg, Left upper leg, Face, Buttocks, Left lower leg   Body parts bathed by helper: Left lower leg, Buttocks Body parts n/a: Right lower leg   Bathing assist Assist Level: Set up assist     Upper Body Dressing/Undressing Upper body dressing   What is the patient wearing?: Pull over shirt, Bra    Upper body assist Assist Level: Set up assist    Lower Body Dressing/Undressing Lower body dressing      What is the patient wearing?: Pants, Underwear/pull up     Lower body assist Assist for lower body dressing: Set up  assist     Toileting Toileting    Toileting assist Assist for toileting: Independent with assistive device     Transfers Chair/bed transfer  Transfers assist     Chair/bed transfer assist level: Supervision/Verbal cueing     Locomotion Ambulation   Ambulation assist      Assist level: Supervision/Verbal cueing Assistive device: Walker-rolling Max distance: 200 ft   Walk 10 feet activity   Assist     Assist level: Supervision/Verbal cueing Assistive device: Walker-rolling   Walk 50 feet activity   Assist Walk 50 feet with 2 turns activity did not occur: Safety/medical concerns  Assist level: Supervision/Verbal cueing Assistive device: Walker-rolling    Walk 150 feet activity   Assist Walk 150 feet  activity did not occur: Safety/medical concerns  Assist level: Supervision/Verbal cueing Assistive device: Walker-rolling    Walk 10 feet on uneven surface  activity   Assist Walk 10 feet on uneven surfaces activity did not occur: Safety/medical concerns   Assist level: Contact Guard/Touching assist Assistive device: Walker-rolling   Wheelchair     Assist Is the patient using a wheelchair?: No Type of Wheelchair: Manual    Wheelchair assist level: Dependent - Patient 0% Max wheelchair distance: 150'    Wheelchair 50 feet with 2 turns activity    Assist        Assist Level: Dependent - Patient 0%   Wheelchair 150 feet activity     Assist      Assist Level: Dependent - Patient 0%   Blood pressure (!) 124/46, pulse 83, temperature 99.5 F (37.5 C), resp. rate 15, height 5\' 1"  (1.549 m), weight 56.9 kg, SpO2 96 %.  Medical Problem List and Plan: 1. 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  Antithrombotics: -DVT/anticoagulation:  Pharmaceutical: Other (comment)--on Eliquis             -antiplatelet therapy: N/A 3. Pain Management:  Voltaren gel and lidoderm for L shoulder pain  Scheduled gabapentin 100mg  TID. Replaced prn oxycodone with scheduled Tylenol #3 TID as patient took previously.   Controlled with meds on 12/3 4. Mood: LCSW to follow for evaluation and support.              -antipsychotic agents: N/A 5. Neuropsych: This patient is capable of making decisions on her own behalf. 6. Skin/Wound Care: Monitor wound for healing. Has low protein stores--alb-2.4             --added Juven to supplement low calorie malnutrition             Ortho notes reviewed-Daily dressing changes.  Wound without signs/symptoms of infection 7. Fluids/Electrolytes/Nutrition: Monitor I/Os.  8. A fib: Monitor HR TID-- on Coreg BID w/Eliquis 9. T2DM: Hgb A1C-  continue insulin Glargine  with SSI.   CBG (last 3)  Recent Labs    02/03/21 1601 02/03/21 2103 02/04/21 0608  GLUCAP 77 202* 114*    Labile on 12/2, monitor for trend, ?related to #19 10. Acute blood loss anemia:   Hemoglobin 8.7 on 12/1, repeat labs ordered 11. Chemo/Diabetes induced peripheral neuropathy: Managed with Neurontin  12. RA: On prednisone with monthly infusion of DMARD.  13. Right knee pain: Voltaren gel qid to knee. 14. Left bicipital tendinitis: Continue lidocaine patch to shoulder.   Relatively controlled 15. Chronic angina: SL nitro ordered PRN.  16. SBP  high and DBP low: continued to monitor TID Vitals:   02/04/21 0822 02/04/21 1029  BP: (!) 122/45 (!) 124/46  Pulse: 86 83  Resp: 16 15  Temp: 100 F (37.8 C) 99.5 F (37.5 C)  SpO2: 95% 96%   Coreg increased to 6.25 twice daily on 12/2   Relatively controlled on 12/3 17. Constipation  Improving 18.  Acute renal insufficiency -  Continue IVF noc , reviewed meds no nephrotoxic meds so this is likely intake related   Creatinine 1.25 on 12/3  19. Fevers  Surgical site does not appear to be source.  Blood cultures ordered  CBC ordered, unremarkable  Chest xray ordered  Ua/Ucx ordered   LOS: 11 days A FACE TO FACE EVALUATION WAS PERFORMED  Siana Panameno Lorie Phenix 02/04/2021, 11:32 AM

## 2021-02-04 NOTE — Progress Notes (Signed)
Occupational Therapy Session Note  Patient Details  Name: Shelby Walker MRN: [id number] Date of Birth: 03-Oct-1933  Today's Date: 02/05/2021 OT Group Time: 1100-1200 OT Group Time Calculation (min): 60 min  Skilled Therapeutic Interventions/Progress Updates:    Pt engaged in therapeutic w/c level dance group focusing on patient choice, UE/LE strengthening, salience, activity tolerance, and social participation. Pt was guided through various dance-based exercises involving UEs/LEs and trunk. All music was selected by group members. Emphasis placed on activity tolerance and UB strengthening. Pt exhibited high levels of participation at seated level during group, declined standing when given the opportunity. Encouraged exercise modifications when Lt shoulder appeared to bother her. She was returned to the room by PT at close of session, asked PT to relay to RN pt may like some pain medicine for the Lt shoulder.    Therapy Documentation Precautions:  Precautions Precautions: Fall Required Braces or Orthoses: Other Brace Other Brace: WBAT RLE with CAM boot Restrictions Weight Bearing Restrictions: Yes RLE Weight Bearing: Non weight bearing Other Position/Activity Restrictions: CAM boot when OOB and when WB RLE    Therapy/Group: Group Therapy  Mylea Roarty A Melvina Pangelinan 02/05/2021, 4:28 PM

## 2021-02-04 NOTE — Progress Notes (Signed)
Occupational Therapy Session Note  Patient Details  Name: Shelby Walker MRN: [id number] Date of Birth: 1933-12-09  Today's Date: 02/05/2021 OT Individual Time: 0940-7680 OT Individual Time Calculation (min): 45 min   Short Term Goals: Week 1:  OT Short Term Goal 1 (Week 1): STGs = LTGs 2/2 LOS at Mod I/Set up  Skilled Therapeutic Interventions/Progress Updates:    Pt greeted EOB, wanting to eat a few bites of her breakfast before starting tx. Pt put in her dentures given setup assistance. We discussed events of d/c tomorrow with pt verbalizing understanding. Afterwards she wanted to get ready for the day. Pt ambulated using RW to get clothing items from the closet at Mod I level, dressed EOB sit<stand using device given the same assistance. Mod I for toilet transfer/toileting. OT asked if pt wanted a "Mod I" sign for the door with pt verbalizing that she felt a bit shaky today and would like staff just present while she performed ADLs/functional mobility. Therefore sign was not put up on her door. Pt transferred to the recliner at close of session, all needs within reach.   Therapy Documentation Precautions:  Precautions Precautions: Fall Required Braces or Orthoses: Other Brace Other Brace: WBAT RLE with CAM boot Restrictions Weight Bearing Restrictions: Yes RLE Weight Bearing: Non weight bearing Other Position/Activity Restrictions: CAM boot when OOB and when WB RLE  ADL: ADL Eating: Set up Grooming: Modified independent Upper Body Bathing: Setup Where Assessed-Upper Body Bathing: Sitting at sink Lower Body Bathing: Supervision/safety Where Assessed-Lower Body Bathing: Sitting at sink Upper Body Dressing: Setup Where Assessed-Upper Body Dressing: Sitting at sink Lower Body Dressing: Setup Where Assessed-Lower Body Dressing: Wheelchair Toileting: Modified independent Toilet Transfer: Modified independent Toilet Transfer Method: Ambulating  Therapy/Group: Individual  Therapy  Miroslava Santellan A Sabrea Sankey 02/05/2021, 4:27 PM

## 2021-02-04 NOTE — Progress Notes (Signed)
Brief note:  Patient clinically improving.  Workup unremarkable thus far. Will await completion of further workup prior to considering abx.

## 2021-02-04 NOTE — Plan of Care (Signed)
  Problem: Consults Goal: RH LIMB LOSS PATIENT EDUCATION Description: Description: See Patient Education module for eduction specifics. Outcome: Progressing Goal: Skin Care Protocol Initiated - if Braden Score 18 or less Description: If consults are not indicated, leave blank or document N/A Outcome: Progressing Goal: Diabetes Guidelines if Diabetic/Glucose > 140 Description: If diabetic or lab glucose is > 140 mg/dl - Initiate Diabetes/Hyperglycemia Guidelines & Document Interventions  Outcome: Progressing   Problem: RH SKIN INTEGRITY Goal: RH STG MAINTAIN SKIN INTEGRITY WITH ASSISTANCE Description: STG Maintain Skin Integrity With supervision Assistance. Outcome: Progressing Goal: RH STG ABLE TO PERFORM INCISION/WOUND CARE W/ASSISTANCE Description: STG Able To Perform Incision/Wound Care With supervision Assistance. Outcome: Progressing   Problem: RH SAFETY Goal: RH STG ADHERE TO SAFETY PRECAUTIONS W/ASSISTANCE/DEVICE Description: STG Adhere to Safety Precautions With cues and reminders. Outcome: Progressing Goal: RH STG DECREASED RISK OF FALL WITH ASSISTANCE Description: STG Decreased Risk of Fall With supervision Assistance. Outcome: Progressing   Problem: RH PAIN MANAGEMENT Goal: RH STG PAIN MANAGED AT OR BELOW PT'S PAIN GOAL Description: < 3 on a 0-10 pain scale. Outcome: Progressing   Problem: RH KNOWLEDGE DEFICIT LIMB LOSS Goal: RH STG INCREASE KNOWLEDGE OF SELF CARE AFTER LIMB LOSS Description: Patient will demonstrate knowledge of medication/pain management, skin/wound care, and weight bearing precautions with educational materials and handouts provided by staff independently at discharge. Outcome: Progressing

## 2021-02-04 NOTE — Progress Notes (Signed)
   02/04/21 0620  Vitals  Temp (!) 102.1 F (38.9 C)  BP (!) 102/39  MAP (mmHg) (!) 56  BP Location Right Arm  BP Method Automatic  Patient Position (if appropriate) Lying  Pulse Rate 90  Pulse Rate Source Monitor  Resp 14  MEWS COLOR  MEWS Score Color Yellow  Oxygen Therapy  SpO2 95 %  O2 Device Room Air  MEWS Score  MEWS Temp 2  MEWS Systolic 0  MEWS Pulse 0  MEWS RR 0  MEWS LOC 0  MEWS Score 2

## 2021-02-05 DIAGNOSIS — R509 Fever, unspecified: Secondary | ICD-10-CM

## 2021-02-05 LAB — PROCALCITONIN
Procalcitonin: <0.1 ng/mL
Procalcitonin: <0.1 ng/mL

## 2021-02-05 LAB — URINE CULTURE

## 2021-02-05 LAB — GLUCOSE, CAPILLARY
Glucose-Capillary: 97 mg/dL (ref 70–99)
Glucose-Capillary: 164 mg/dL — ABNORMAL HIGH (ref 70–99)
Glucose-Capillary: 167 mg/dL — ABNORMAL HIGH (ref 70–99)
Glucose-Capillary: 126 mg/dL — ABNORMAL HIGH (ref 70–99)

## 2021-02-05 NOTE — Progress Notes (Signed)
Inpatient Rehabilitation Discharge Medication Review by a Pharmacist  A complete drug regimen review was completed for this patient to identify any potential clinically significant medication issues.  High Risk Drug Classes Is patient taking? Indication by Medication  Antipsychotic No   Anticoagulant Yes Apixaban, Afib  Antibiotic No   Opioid Yes Tylenol w/ codeine #3, tramadol for pain  Antiplatelet No   Hypoglycemics/insulin Yes SSI, Semglee for DM  Vasoactive Medication Yes Carvedilol for BP, Afib  Chemotherapy No   Other yes Prednisone for RA Lidocaine patch pain Atorvastatin HLD Nexium GERD Restasis dry eyes     Type of Medication Issue Identified Description of Issue Recommendation(s)  Drug Interaction(s) (clinically significant)     Duplicate Therapy     Allergy     No Medication Administration End Date     Incorrect Dose     Additional Drug Therapy Needed     Significant med changes from prior encounter (inform family/care partners about these prior to discharge).    Other       Clinically significant medication issues were identified that warrant physician communication and completion of prescribed/recommended actions by midnight of the next day:  No  Pharmacist comments: Resume supplements as discharge  Time spent performing this drug regimen review (minutes):  20 minutes   Onnie Boer, PharmD, [place], AAHIVP, CPP Infectious Disease Pharmacist 02/05/2021 10:16 AM

## 2021-02-05 NOTE — Plan of Care (Signed)
  Problem: RH Balance Goal: LTG Patient will maintain dynamic standing with ADLs (OT) Description: LTG:  Patient will maintain dynamic standing balance with assist during activities of daily living (OT)  Outcome: Completed/Met   Problem: Sit to Stand Goal: LTG:  Patient will perform sit to stand in prep for activites of daily living with assistance level (OT) Description: LTG:  Patient will perform sit to stand in prep for activites of daily living with assistance level (OT) Outcome: Completed/Met   Problem: RH Eating Goal: LTG Patient will perform eating w/assist, cues/equip (OT) Description: LTG: Patient will perform eating with assist, with/without cues using equipment (OT) Outcome: Completed/Met   Problem: RH Grooming Goal: LTG Patient will perform grooming w/assist,cues/equip (OT) Description: LTG: Patient will perform grooming with assist, with/without cues using equipment (OT) Outcome: Completed/Met   Problem: RH Bathing Goal: LTG Patient will bathe all body parts with assist levels (OT) Description: LTG: Patient will bathe all body parts with assist levels (OT) Outcome: Completed/Met   Problem: RH Dressing Goal: LTG Patient will perform upper body dressing (OT) Description: LTG Patient will perform upper body dressing with assist, with/without cues (OT). Outcome: Completed/Met Goal: LTG Patient will perform lower body dressing w/assist (OT) Description: LTG: Patient will perform lower body dressing with assist, with/without cues in positioning using equipment (OT) Outcome: Completed/Met   Problem: RH Toileting Goal: LTG Patient will perform toileting task (3/3 steps) with assistance level (OT) Description: LTG: Patient will perform toileting task (3/3 steps) with assistance level (OT)  Outcome: Completed/Met   Problem: RH Simple Meal Prep Goal: LTG Patient will perform simple meal prep w/assist (OT) Description: LTG: Patient will perform simple meal prep with  assistance, with/without cues (OT). Outcome: Completed/Met   Problem: RH Toilet Transfers Goal: LTG Patient will perform toilet transfers w/assist (OT) Description: LTG: Patient will perform toilet transfers with assist, with/without cues using equipment (OT) Outcome: Completed/Met   Problem: RH Tub/Shower Transfers Goal: LTG Patient will perform tub/shower transfers w/assist (OT) Description: LTG: Patient will perform tub/shower transfers with assist, with/without cues using equipment (OT) Outcome: Completed/Met

## 2021-02-05 NOTE — Plan of Care (Signed)
  Problem: Consults Goal: RH LIMB LOSS PATIENT EDUCATION Description: Description: See Patient Education module for eduction specifics. Outcome: Progressing Goal: Skin Care Protocol Initiated - if Braden Score 18 or less Description: If consults are not indicated, leave blank or document N/A Outcome: Progressing Goal: Diabetes Guidelines if Diabetic/Glucose > 140 Description: If diabetic or lab glucose is > 140 mg/dl - Initiate Diabetes/Hyperglycemia Guidelines & Document Interventions  Outcome: Progressing   Problem: RH SKIN INTEGRITY Goal: RH STG MAINTAIN SKIN INTEGRITY WITH ASSISTANCE Description: STG Maintain Skin Integrity With supervision Assistance. Outcome: Progressing Goal: RH STG ABLE TO PERFORM INCISION/WOUND CARE W/ASSISTANCE Description: STG Able To Perform Incision/Wound Care With supervision Assistance. Outcome: Progressing   Problem: RH SAFETY Goal: RH STG ADHERE TO SAFETY PRECAUTIONS W/ASSISTANCE/DEVICE Description: STG Adhere to Safety Precautions With cues and reminders. Outcome: Progressing Goal: RH STG DECREASED RISK OF FALL WITH ASSISTANCE Description: STG Decreased Risk of Fall With supervision Assistance. Outcome: Progressing   Problem: RH PAIN MANAGEMENT Goal: RH STG PAIN MANAGED AT OR BELOW PT'S PAIN GOAL Description: < 3 on a 0-10 pain scale. Outcome: Progressing   Problem: RH KNOWLEDGE DEFICIT LIMB LOSS Goal: RH STG INCREASE KNOWLEDGE OF SELF CARE AFTER LIMB LOSS Description: Patient will demonstrate knowledge of medication/pain management, skin/wound care, and weight bearing precautions with educational materials and handouts provided by staff independently at discharge. Outcome: Progressing

## 2021-02-05 NOTE — Progress Notes (Addendum)
PROGRESS NOTE   Subjective/Complaints: Patient seen laying in bed this morning.  She states she slept well overnight.  She states she feels much better than yesterday.  Discussed with nursing, also reports patient clinically looking better.  She is looking for DC soon.  ROS: Denies fevers, chills, CP, SOB, N/V/D  Objective:   DG Chest 2 View  Result Date: 02/04/2021 CLINICAL DATA:  Fever EXAM: CHEST - 2 VIEW COMPARISON:  09/01/2018 FINDINGS: No new consolidation or edema. No pleural effusion. Stable cardiomediastinal contours with normal heart size. No acute osseous abnormality. IMPRESSION: No acute process in the chest. Electronically Signed   By: Macy Mis M.D.   On: 02/04/2021 09:39   Recent Labs    02/04/21 1154  WBC 7.3  HGB 8.0*  HCT 25.2*  PLT 250     Recent Labs    02/03/21 0620 02/04/21 0554  NA 136 137  K 3.9 4.2  CL 104 104  CO2 27 23  GLUCOSE 122* 121*  BUN 57* 53*  CREATININE 1.21* 1.25*  CALCIUM 8.1* 8.0*      Intake/Output Summary (Last 24 hours) at 02/05/2021 1658 Last data filed at 02/05/2021 1300 Gross per 24 hour  Intake 413 ml  Output 2 ml  Net 411 ml         Physical Exam: Vital Signs Blood pressure 119/80, pulse 77, temperature 98.8 F (37.1 C), temperature source Oral, resp. rate 18, height 5\' 1"  (1.549 m), weight 56.9 kg, SpO2 99 %. Constitutional: No distress . Vital signs reviewed. HENT: Normocephalic.  Atraumatic. Eyes: EOMI. No discharge. Cardiovascular: No JVD.  Irregularly irregular. Respiratory: Normal effort.  No stridor.  Bilateral clear to auscultation. GI: Non-distended.  BS +. Skin: Warm and dry.  Right TMA with dressing CDI. Psych: Normal mood.  Normal behavior. Musc: No edema in extremities.  No tenderness in extremities. Neuro: Alert HOH Motor: UE strength 5-/5- however has severe RA chronic changes/ulnar deviation in hands B/L with B/L Hand atrophy,  stable RLE- HF/KE/KF 5-/5; NT DF LLE- 5-/5   Assessment/Plan: 1. Functional deficits which require 3+ hours per day of interdisciplinary therapy in a comprehensive inpatient rehab setting. Physiatrist is providing close team supervision and 24 hour management of active medical problems listed below. Physiatrist and rehab team continue to assess barriers to discharge/monitor patient progress toward functional and medical goals  Care Tool:  Bathing    Body parts bathed by patient: Right arm, Left arm, Chest, Abdomen, Front perineal area, Right upper leg, Left upper leg, Face, Buttocks, Left lower leg (per simulation)   Body parts bathed by helper: Left lower leg, Buttocks Body parts n/a: Right lower leg   Bathing assist Assist Level: Set up assist     Upper Body Dressing/Undressing Upper body dressing   What is the patient wearing?: Pull over shirt, Bra    Upper body assist Assist Level: Independent with assistive device    Lower Body Dressing/Undressing Lower body dressing      What is the patient wearing?: Pants, Underwear/pull up     Lower body assist Assist for lower body dressing: Independent with assitive device     Toileting Toileting  Toileting assist Assist for toileting: Independent with assistive device     Transfers Chair/bed transfer  Transfers assist     Chair/bed transfer assist level: Supervision/Verbal cueing     Locomotion Ambulation   Ambulation assist      Assist level: Supervision/Verbal cueing Assistive device: Walker-rolling Max distance: 200 ft   Walk 10 feet activity   Assist     Assist level: Supervision/Verbal cueing Assistive device: Walker-rolling   Walk 50 feet activity   Assist Walk 50 feet with 2 turns activity did not occur: Safety/medical concerns  Assist level: Supervision/Verbal cueing Assistive device: Walker-rolling    Walk 150 feet activity   Assist Walk 150 feet activity did not occur:  Safety/medical concerns  Assist level: Supervision/Verbal cueing Assistive device: Walker-rolling    Walk 10 feet on uneven surface  activity   Assist Walk 10 feet on uneven surfaces activity did not occur: Safety/medical concerns   Assist level: Contact Guard/Touching assist Assistive device: Walker-rolling   Wheelchair     Assist Is the patient using a wheelchair?: No Type of Wheelchair: Manual    Wheelchair assist level: Dependent - Patient 0% Max wheelchair distance: 150'    Wheelchair 50 feet with 2 turns activity    Assist        Assist Level: Dependent - Patient 0%   Wheelchair 150 feet activity     Assist      Assist Level: Dependent - Patient 0%   Blood pressure 119/80, pulse 77, temperature 98.8 F (37.1 C), temperature source Oral, resp. rate 18, height 5\' 1"  (1.549 m), weight 56.9 kg, SpO2 99 %.  Medical Problem List and Plan: 1. 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  Antithrombotics: -DVT/anticoagulation:  Pharmaceutical: Other (comment)--on Eliquis             -antiplatelet therapy: N/A 3. Pain Management:  Voltaren gel and lidoderm for L shoulder pain  Scheduled gabapentin 100mg  TID. Replaced prn oxycodone with scheduled Tylenol #3 TID as patient took previously.   Controlled with meds on 12/4 4. Mood: LCSW to follow for evaluation and support.              -antipsychotic agents: N/A 5. Neuropsych: This patient is capable of making decisions on her own behalf. 6. Skin/Wound Care: Monitor wound for healing. Has low protein stores--alb-2.4             --added Juven to supplement low calorie malnutrition             Ortho notes reviewed-Daily dressing changes.  Wound without signs/symptoms of infection 7. Fluids/Electrolytes/Nutrition: Monitor I/Os.  8. A fib: Monitor HR TID-- on Coreg BID w/Eliquis 9. T2DM: Hgb A1C-  continue insulin Glargine with  SSI.   CBG (last 3)  Recent Labs    02/05/21 0608 02/05/21 1211 02/05/21 1623  GLUCAP 97 164* 167*    Labile, but stabilizing on 12/4, monitor for trend 10. Acute blood loss anemia:   Hemoglobin 8.0 on 12/3, fluctuates per review-labs ordered for tomorrow  11. Chemo/Diabetes induced peripheral neuropathy: Managed with Neurontin  12. RA: On prednisone with monthly infusion of DMARD.  13. Right knee pain: Voltaren gel qid to knee. 14. Left bicipital tendinitis: Continue lidocaine patch to shoulder.   Relatively controlled 15. Chronic angina: SL nitro ordered PRN.  16. SBP high and DBP low: continued  to monitor TID Vitals:   02/05/21 0409 02/05/21 1339  BP: 116/61 119/80  Pulse: 86 77  Resp: 18 18  Temp: (!) 100.5 F (38.1 C) 98.8 F (37.1 C)  SpO2: 97% 99%   Coreg increased to 6.25 twice daily on 12/2   Relatively controlled on 12/4 17. Constipation  Improving 18.  Acute renal insufficiency -  IVF noc d/ced on 12/4  Creatinine 1.25 on 12/3  19. Fevers-improving, however on Tylenol 3  Surgical site does not appear to be source.  Blood cultures pending  CBC unremarkable for infection, unremarkable  Chest xray unremarkable  Procalcitonin negative  Ua with trace LE, otherwise unremarkable,Ucx with multiple species, will reorder   LOS: 12 days A FACE TO FACE EVALUATION WAS PERFORMED  Linzy Darling Lorie Phenix 02/05/2021, 4:58 PM

## 2021-02-05 NOTE — Progress Notes (Signed)
Physical Therapy Session Note  Patient Details  Name: Shelby Walker MRN: [id number] Date of Birth: [birth year]  Today's Date: 02/05/2021 PT Individual Time: 9295-7473 PT Individual Time Calculation (min): 24 min  Today's Date: 02/05/2021 PT Missed Time: 21 Minutes Missed Time Reason: Other (Comment) (eating lunch)  Short Term Goals: Week 1:  PT Short Term Goal 1 (Week 1): =LTG due to ELOS Week 2:  PT Short Term Goal 1 (Week 2): =LTGs d/t ELOS  Skilled Therapeutic Interventions/Progress Updates:   Received pt sitting in recliner. Pt just received lunch tray and requested therapist allow her time to eat. Upon returning pt ambulating to bathroom and able to perform all toileting tasks mod I. Pt stood at sink and washed hands mod I. Pt agreeable to PT treatment and reported mild pain in L shoulder during session (un-rated). Session with emphasis on functional mobility, toileting, gait training, and improved endurance. RLE CAM boot donned throughout session. Pt ambulated 16ft x 2 trials with RW mod I to/from ortho gym. Pt performed seated BUE strengthening on UBE at level 1 for 1 minute forwards and 1 minute backwards for a total of 4 minutes. Pt then ambulated 58ft on uneven surfaces (ramp) with RW and supervision. Pt denied any questions or concerns regarding upcoming D/C stating that her family lives next door and she has lots of support. Concluded session with pt sitting in recliner with all needs within reach. 21 minutes missed of skilled physical therapy due to eating lunch.   Therapy Documentation Precautions:  Precautions Precautions: Fall Required Braces or Orthoses: Other Brace Other Brace: WBAT RLE with CAM boot Restrictions Weight Bearing Restrictions: Yes RLE Weight Bearing: Non weight bearing Other Position/Activity Restrictions: CAM boot when OOB and when WB RLE  Therapy/Group: Individual Therapy Alfonse Alpers PT, DPT   02/05/2021, 7:37 AM

## 2021-02-06 ENCOUNTER — Inpatient Hospital Stay (HOSPITAL_COMMUNITY): Payer: Medicare Other

## 2021-02-06 DIAGNOSIS — J208 Acute bronchitis due to other specified organisms: Secondary | ICD-10-CM

## 2021-02-06 LAB — CBC
HCT: 26 % — ABNORMAL LOW (ref 36.0–46.0)
Hemoglobin: 8.3 g/dL — ABNORMAL LOW (ref 12.0–15.0)
MCH: 29.6 pg (ref 26.0–34.0)
MCHC: 31.9 g/dL (ref 30.0–36.0)
MCV: 92.9 fL (ref 80.0–100.0)
Platelets: 226 10*3/uL (ref 150–400)
RBC: 2.8 MIL/uL — ABNORMAL LOW (ref 3.87–5.11)
RDW: 15.2 % (ref 11.5–15.5)
WBC: 5.6 10*3/uL (ref 4.0–10.5)
nRBC: 0 % (ref 0.0–0.2)

## 2021-02-06 LAB — GLUCOSE, CAPILLARY
Glucose-Capillary: 155 mg/dL — ABNORMAL HIGH (ref 70–99)
Glucose-Capillary: 277 mg/dL — ABNORMAL HIGH (ref 70–99)

## 2021-02-06 LAB — PROCALCITONIN: Procalcitonin: <0.1 ng/mL

## 2021-02-06 MED ORDER — ALBUTEROL SULFATE (2.5 MG/3ML) 0.083% IN NEBU: 2.5000 mg | INHALATION_SOLUTION | Freq: Four times a day (QID) | RESPIRATORY_TRACT | Status: DC | PRN

## 2021-02-06 MED ORDER — INSULIN ASPART 100 UNIT/ML FLEXPEN: 3.0000 [IU] | PEN_INJECTOR | Freq: Three times a day (TID) | SUBCUTANEOUS | 11 refills | Status: DC

## 2021-02-06 MED ORDER — DICLOFENAC SODIUM 1 % EX GEL: 2.0000 g | Freq: Four times a day (QID) | CUTANEOUS | 0 refills | Status: DC

## 2021-02-06 MED ORDER — ALBUTEROL SULFATE HFA 108 (90 BASE) MCG/ACT IN AERS: 1.0000 | INHALATION_SPRAY | Freq: Four times a day (QID) | RESPIRATORY_TRACT | 2 refills | Status: DC | PRN

## 2021-02-06 MED ORDER — SACCHAROMYCES BOULARDII 250 MG PO CAPS: 250.0000 mg | ORAL_CAPSULE | Freq: Two times a day (BID) | ORAL | Status: DC

## 2021-02-06 MED ORDER — ALBUTEROL SULFATE HFA 108 (90 BASE) MCG/ACT IN AERS: 1.0000 | INHALATION_SPRAY | Freq: Four times a day (QID) | RESPIRATORY_TRACT | Status: DC | PRN

## 2021-02-06 MED ORDER — ALBUTEROL SULFATE (2.5 MG/3ML) 0.083% IN NEBU
2.5000 mg | INHALATION_SOLUTION | Freq: Once | RESPIRATORY_TRACT | Status: AC
  Administered 2021-02-06: 2.5 mg via RESPIRATORY_TRACT
  Filled 2021-02-06: qty 3

## 2021-02-06 MED ORDER — ACETAMINOPHEN-CODEINE #3 300-30 MG PO TABS: 1.0000 | ORAL_TABLET | Freq: Three times a day (TID) | ORAL | 0 refills | Status: DC

## 2021-02-06 MED ORDER — CARVEDILOL 6.25 MG PO TABS: 6.2500 mg | ORAL_TABLET | Freq: Two times a day (BID) | ORAL | 0 refills | Status: DC

## 2021-02-06 MED ORDER — GABAPENTIN 100 MG PO CAPS: 100.0000 mg | ORAL_CAPSULE | Freq: Three times a day (TID) | ORAL | 0 refills | Status: DC

## 2021-02-06 NOTE — Discharge Summary (Addendum)
Physician Discharge Summary  Patient ID: Shelby Walker MRN: [id number] DOB/AGE: 05/06/1933 87 y.o.  Admit date: 01/24/2021 Discharge date: 02/06/2021  Discharge Diagnoses:  Principal Problem:   S/P transmetatarsal amputation of foot, right (HCC) Active Problems:   Diabetes mellitus ([place])   Essential hypertension   Stage 3b chronic kidney disease (CKD) (HCC)   Acute blood loss anemia   Postoperative pain   Acute viral bronchitis   Discharged Condition: good  Significant Diagnostic Studies: N/a  Labs:  Basic Metabolic Panel: Recent Labs  Lab 02/01/21 0507 02/02/21 0536 02/03/21 0620 02/04/21 0554  NA 140 140 136 137  K 4.6 4.5 3.9 4.2  CL 102 105 104 104  CO2 $Re'30 29 27 23  'fVB$ GLUCOSE 163* 124* 122* 121*  BUN 68* 51* 57* 53*  CREATININE 1.47* 1.39* 1.21* 1.25*  CALCIUM 8.8* 8.3* 8.1* 8.0*    CBC: Recent Labs  Lab 02/02/21 0536 02/04/21 1154 02/06/21 0557  WBC 6.9 7.3 5.6  NEUTROABS  --  4.6  --   HGB 8.7* 8.0* 8.3*  HCT 27.2* 25.2* 26.0*  MCV 93.5 94.0 92.9  PLT 270 250 226    CBG: Recent Labs  Lab 02/05/21 1211 02/05/21 1623 02/05/21 2039 02/06/21 0546 02/06/21 1128  GLUCAP 164* 167* 126* 155* 277*    Brief HPI:   Shelby Walker is a 87 y.o. female with history of T2DM, C. difficile colitis with sepsis, GERD, RA, A. fib-on Eliquis, diabetic foot ulcer who was admitted on 12/16/2020 with gangrenous changes with necrosis and purulent drainage.  She was started on broad-spectrum antibiotics and underwent left transmetatarsal amputation on 11/16 by Dr. Jerilee Hoh.  Postop to be WBAT in CAM boot.  Hospital course significant for issues with poorly controlled blood sugars, poor pain control as well as acute blood loss anemia.  Therapy was ongoing and patient was noted to have limitations in mobility and ability to carry out ADLs.  CIR was recommended due to functional decline.   Hospital Course: Shelby Walker was admitted to rehab 01/24/2021 for inpatient therapies to  consist of PT and OT at least three hours five days a week. Past admission physiatrist, therapy team and rehab RN have worked together to provide customized collaborative inpatient rehab. She was maintained on Eliquis during her stay and Coreg was decreased to 3.25 mg due to intermittent hypotension.  Her diabetes has been monitored with ac/hs CBG checks and SSI was use prn for tighter BS control.  Her meal coverage was decreased to 3 units 3 times daily AC to prevent hypoglycemic episodes.  She was advised to continue monitoring blood sugars 3 times daily to 4 times daily basis and follow-up with PCP for further titration of her insulin regimen.    Follow-up CBC showed ABLA is slowly improving. Serial check of electrolyes showed that acute on chronic renal failure is resolving. She was noted to have low-grade fever in early a.m. on day of discharge.  Chest x-ray done was negative for acute changes.  CBC shows white count to be stable but she was noted to have some wheezing.  Symptoms felt to be due to a viral bronchitis and she was educated on importance of pulmonary hygiene, increasing fluid intake as well as using Robitussin as needed.  Albuterol inhaler was ordered for use as needed wheezing. Pain control was achieve after scheduling of gabapentin TID and tylenol #3 TID. Right trans met incision is C/D/I with sutures in place and is healing well. She has made  good gains during her rehab stay and will continue to receive follow up [place], [place] and [place] by [facility] after discharge.    Rehab course: During patient's stay in rehab weekly team conferences were held to monitor patient's progress, set goals and discuss barriers to discharge. At admission, patient required min assist with mobility and with ADL tasks. She  has had improvement in activity tolerance, balance, postural control as well as ability to compensate for deficits.  She requires set up supervision for shower bathing.  She is modified  independent for upper body lower body dressing, toileting and for ambulating to and from bathroom use using rolling walker.  She is able to don cam boot with foot propped on surface.  She is modified independent for transfers and to ambulate 75 feet with rolling walker.  She requires supervision for uneven surfaces or to navigate ramp. Family education was completed with daughter.  Discharge disposition: 01-Home or Self Care  Diet: Carb modified  Special Instructions: Wash incision with soap and water (no soaking).  Pat dry and apply dry dressing.  2.  Monitor BS TID ac and follow up with PCP for adjustment of insulin.    Allergies as of 02/06/2021       Reactions   Cholestyramine    Possible- Makes throat burn. Patient said she can't take it    Compazine [prochlorperazine Edisylate] Swelling   REACTION: " tongue swell and unable to swallow"   Sulfonamide Derivatives Other (See Comments)   REACTION: " broke out with fine itching bumps"   Hydrocodone Other (See Comments)   Infliximab    Other reaction(s): rash   Prochlorperazine    Other reaction(s): Unknown   Rosuvastatin    Other reaction(s): muscle aches   Sulfa Antibiotics    Other reaction(s): tongue swelling        Medication List     STOP taking these medications    acetaminophen 325 MG tablet Commonly known as: TYLENOL   docusate sodium 100 MG capsule Commonly known as: Colace   senna 8.6 MG Tabs tablet Commonly known as: SENOKOT   traMADol 50 MG tablet Commonly known as: ULTRAM       TAKE these medications    acetaminophen-codeine 300-30 MG tablet--Rx 24 pills Commonly known as: TYLENOL #3 Take 1 tablet by mouth in the morning, at noon, and at bedtime.   albuterol 108 (90 Base) MCG/ACT inhaler Commonly known as: VENTOLIN HFA Inhale 1-2 puffs into the lungs every 6 (six) hours as needed for wheezing or shortness of breath.   apixaban 2.5 MG Tabs tablet Commonly known as: Eliquis Take 1 tablet by  mouth 2 times daily What changed:  how much to take how to take this when to take this additional instructions   atorvastatin 20 MG tablet Commonly known as: LIPITOR Take 1 tablet by mouth once daily What changed: when to take this   Calcium Carbonate-Vitamin D 600-400 MG-UNIT tablet Take 1 tablet by mouth daily at 12 noon.   carvedilol 6.25 MG tablet Commonly known as: COREG Take 1 tablet (6.25 mg total) by mouth 2 (two) times daily with a meal. What changed:  medication strength how much to take   CENTRUM SILVER 50+WOMEN PO Take 1 tablet by mouth daily.   Cholecalciferol 25 MCG (1000 UT) capsule Take 1 capsule (1,000 Units total) by mouth daily.   diclofenac Sodium 1 % Gel Commonly known as: VOLTAREN Apply 2 g topically 4 (four) times daily. Notes to  patient: To right knee   Ensure Active Liqd Take 237 mLs by mouth in the morning. Prefers Chocolate   esomeprazole 20 MG capsule Commonly known as: NEXIUM Take 20 mg by mouth in the morning.   gabapentin 100 MG capsule Commonly known as: NEURONTIN Take 1 capsule (100 mg total) by mouth 3 (three) times daily. What changed:  when to take this reasons to take this   insulin aspart 100 UNIT/ML FlexPen Commonly known as: NOVOLOG Inject 3 Units into the skin 3 (three) times daily with meals. Sliding scale What changed: how much to take   lidocaine 5 % Commonly known as: LIDODERM Place 1 patch onto the skin daily. Purchase over the counter --on for 12 hours and off for 12 hours   lip balm ointment Apply topically as needed for lip care.   liver oil-zinc oxide 40 % ointment Commonly known as: DESITIN Apply 1 application topically 3 (three) times daily. To raw areas in between/on buttocks Notes to patient: Use desitin or Zinc oxide.    nitroGLYCERIN 0.4 MG SL tablet Commonly known as: NITROSTAT Place 1 tablet (0.4 mg total) under the tongue every 5 (five) minutes as needed for chest pain.   predniSONE 1 MG  tablet Commonly known as: DELTASONE Take 4 mg by mouth in the morning.   Restasis 0.05 % ophthalmic emulsion Generic drug: cycloSPORINE Place 1 drop into both eyes 2 (two) times daily.   saccharomyces boulardii 250 MG capsule Commonly known as: FLORASTOR Take 1 capsule (250 mg total) by mouth 2 (two) times daily.   Selsun Blue Dry Scalp 1 % shampoo Generic drug: pyrithione zinc Apply 1 application topically once a week.   TRESIBA [place] Inject 6-8 Units into the skin at bedtime. Sliding scale        Follow-up Information     Lovorn, Jinny Blossom, MD Follow up.   Specialty: Physical Medicine and Rehabilitation Why: call as needed. Contact information: [address] [place] [address] [phone]         Reynold Bowen, MD. Call today.   Specialty: Endocrinology Why: for post hospital follow up Contact information: [place] [address] [phone]         Wylene Simmer, MD. Call.   Specialty: Orthopedic Surgery Why: for post op appointment Contact information: [address] [place] [address] [phone]                 Signed: Bary Leriche 02/06/2021, 5:04 PM

## 2021-02-06 NOTE — Progress Notes (Signed)
PROGRESS NOTE   Subjective/Complaints: Occ non productive cough , no coughing during visit , CXR reviewed Daughter has questions regarding wound care and shoulder pain Left shoulder no hx of surgery, no recent trauma, pain mainly with ROM  ROS: Denies fevers, chills, CP, SOB, N/V/D  Objective:   DG Chest 2 View  Result Date: 02/04/2021 CLINICAL DATA:  Fever EXAM: CHEST - 2 VIEW COMPARISON:  09/01/2018 FINDINGS: No new consolidation or edema. No pleural effusion. Stable cardiomediastinal contours with normal heart size. No acute osseous abnormality. IMPRESSION: No acute process in the chest. Electronically Signed   By: Macy Mis M.D.   On: 02/04/2021 09:39   Recent Labs    02/04/21 1154 02/06/21 0557  WBC 7.3 5.6  HGB 8.0* 8.3*  HCT 25.2* 26.0*  PLT 250 226     Recent Labs    02/04/21 0554  NA 137  K 4.2  CL 104  CO2 23  GLUCOSE 121*  BUN 53*  CREATININE 1.25*  CALCIUM 8.0*      Intake/Output Summary (Last 24 hours) at 02/06/2021 0751 Last data filed at 02/05/2021 1300 Gross per 24 hour  Intake 177 ml  Output --  Net 177 ml         Physical Exam: Vital Signs Blood pressure (!) 161/61, pulse 87, temperature 98 F (36.7 C), resp. rate 18, height 5\' 1"  (1.549 m), weight 56.9 kg, SpO2 97 %.  General: No acute distress Mood and affect are appropriate Heart: Regular rate and rhythm no rubs murmurs or extra sounds Lungs: Clear to auscultation, breathing unlabored, no rales or wheezes Abdomen: Positive bowel sounds, soft nontender to palpation, nondistended Extremities: No clubbing, cyanosis, or edema Skin: No evidence of breakdown, no evidence of rash    Neuro: Alert HOH Motor: UE strength 5-/5- however has severe RA chronic changes/ulnar deviation in hands B/L with B/L Hand atrophy, stable RLE- HF/KE/KF 5-/5; NT DF LLE- 5-/5   Assessment/Plan: 1. Functional deficits which require 3+ hours  per day of interdisciplinary therapy in a comprehensive inpatient rehab setting. Physiatrist is providing close team supervision and 24 hour management of active medical problems listed below. Physiatrist and rehab team continue to assess barriers to discharge/monitor patient progress toward functional and medical goals  Care Tool:  Bathing    Body parts bathed by patient: Right arm, Left arm, Chest, Abdomen, Front perineal area, Right upper leg, Left upper leg, Face, Buttocks, Left lower leg (per simulation)   Body parts bathed by helper: Left lower leg, Buttocks Body parts n/a: Right lower leg   Bathing assist Assist Level: Set up assist     Upper Body Dressing/Undressing Upper body dressing   What is the patient wearing?: Pull over shirt, Bra    Upper body assist Assist Level: Independent with assistive device    Lower Body Dressing/Undressing Lower body dressing      What is the patient wearing?: Pants, Underwear/pull up     Lower body assist Assist for lower body dressing: Independent with assitive device     Toileting Toileting    Toileting assist Assist for toileting: Independent with assistive device     Transfers Chair/bed transfer  Transfers assist     Chair/bed transfer assist level: Supervision/Verbal cueing     Locomotion Ambulation   Ambulation assist      Assist level: Supervision/Verbal cueing Assistive device: Walker-rolling Max distance: 200 ft   Walk 10 feet activity   Assist     Assist level: Supervision/Verbal cueing Assistive device: Walker-rolling   Walk 50 feet activity   Assist Walk 50 feet with 2 turns activity did not occur: Safety/medical concerns  Assist level: Supervision/Verbal cueing Assistive device: Walker-rolling    Walk 150 feet activity   Assist Walk 150 feet activity did not occur: Safety/medical concerns  Assist level: Supervision/Verbal cueing Assistive device: Walker-rolling    Walk 10 feet  on uneven surface  activity   Assist Walk 10 feet on uneven surfaces activity did not occur: Safety/medical concerns   Assist level: Contact Guard/Touching assist Assistive device: Walker-rolling   Wheelchair     Assist Is the patient using a wheelchair?: No Type of Wheelchair: Manual    Wheelchair assist level: Dependent - Patient 0% Max wheelchair distance: 150'    Wheelchair 50 feet with 2 turns activity    Assist        Assist Level: Dependent - Patient 0%   Wheelchair 150 feet activity     Assist      Assist Level: Dependent - Patient 0%   Blood pressure (!) 161/61, pulse 87, temperature 98 F (36.7 C), resp. rate 18, height 5\' 1"  (1.549 m), weight 56.9 kg, SpO2 97 %.  Medical Problem List and Plan: 1. Functional deficits secondary to R TMA  01/18/2021 due to necrosis/Diabetic foot ulcer on R foot             Continue CIR PT,OT- stable for D/C 2.  Antithrombotics: -DVT/anticoagulation:  Pharmaceutical: Other (comment)--on Eliquis             -antiplatelet therapy: N/A 3. Pain Management:  Voltaren gel and lidoderm for L shoulder pain  Scheduled gabapentin 100mg  TID. Replaced prn oxycodone with scheduled Tylenol #3 TID as patient took previously.   Controlled with meds on 12/4 4. Mood: LCSW to follow for evaluation and support.              -antipsychotic agents: N/A 5. Neuropsych: This patient is capable of making decisions on her own behalf. 6. Skin/Wound Care: Monitor wound for healing. Has low protein stores--alb-2.4             --added Juven to supplement low calorie malnutrition             Ortho notes reviewed-Daily dressing changes.  Wound without signs/symptoms of infection 7. Fluids/Electrolytes/Nutrition: Monitor I/Os.  8. A fib: Monitor HR TID-- on Coreg BID w/Eliquis 9. T2DM: Hgb A1C-  continue insulin Glargine with SSI.   CBG (last 3)  Recent Labs    02/05/21 1623 02/05/21 2039 02/06/21 0546  GLUCAP 167* 126* 155*    Good in  hospital control  10. Acute blood loss anemia:   Hemoglobin 8.0 on 12/3, fluctuates per review-labs ordered for tomorrow  11. Chemo/Diabetes induced peripheral neuropathy: Managed with Neurontin  12. RA: On prednisone with monthly infusion of DMARD.  13. Right knee pain: Voltaren gel qid to knee. 14. Left bicipital tendinitis: Continue lidocaine patch to shoulder.   Relatively controlled 15. Chronic angina: SL nitro ordered PRN.  16. SBP high and DBP low: continued to monitor TID Vitals:   02/05/21 1925 02/06/21 0444  BP: (!) 104/39 (!) 161/61  Pulse:  71 87  Resp: 16 18  Temp: 98.6 F (37 C) 98 F (36.7 C)  SpO2: 98% 97%   Some lability , d/c on current meds  17. Constipation  Improving 18.  Acute renal insufficiency -  IVF noc d/ced on 12/4  Creatinine 1.25 on 12/3  19. Fevers-improving, however on Tylenol 3  Surgical site does not appear to be source.  UA trace Leukocytes Likely viral bronchitis mild/with atelectasis , order IS, neb x 1 and home with inhaler    LOS: 13 days A FACE TO FACE EVALUATION WAS PERFORMED  Charlett Blake 02/06/2021, 7:51 AM

## 2021-02-06 NOTE — Progress Notes (Addendum)
Inpatient Rehabilitation Care Coordinator Discharge Note   Patient Details  Name: Shelby Walker MRN: [id number] Date of Birth: 10-25-1933   Discharge location: [place] IN TO PROVIDE 24/7 SUPERVISION  Length of Stay:  13 DAYS  Discharge activity level: [place]  Home/community participation: ACTIVE  Patient response [id number] Literacy - How often do you need to have someone help you when you read instructions, pamphlets, or other written material from your doctor or pharmacy?: Never  Patient response [id number] Isolation - How often do you feel lonely or isolated from those around you?: Rarely  Services provided included: MD, RD, PT, OT, RN, CM, Pharmacy, SW  Financial Services:  Financial Services Utilized: Medicare    Choices offered to/list presented to: PT AND DAUGHTER  Follow-up services arranged:  Home Health, DME, Patient/Family request agency HH/DME [place]: [facility]-PT,OT,RN    DME : [facility] HH/DME Requested Agency: [place]  Patient response to transportation need: Is the patient able to respond to transportation needs?: Yes In the past 12 months, has lack of transportation kept you from medical appointments or from getting medications?: No In the past 12 months, has lack of transportation kept you from meetings, work, or from getting things needed for daily living?: No    Comments (or additional information): DAUGHTER AND SON WERE HERE FOR EDUCATION ALL PREPARED FOR DISCHARGE TODAY.   Patient/Family verbalized understanding of follow-up arrangements:  Yes  Individual responsible for coordination of the follow-up plan: LISA-DAUGHTER  [phone]  Confirmed correct DME delivered: Elease Hashimoto 02/06/2021    Cormick Moss, Gard[place] Rhyme

## 2021-02-08 ENCOUNTER — Encounter: Payer: Self-pay | Admitting: *Deleted

## 2021-02-08 ENCOUNTER — Telehealth: Payer: Self-pay

## 2021-02-08 NOTE — Telephone Encounter (Signed)
Approval authorizes your coverage from 03/05/2020 - 03/04/2022.Pharmacy and Mrs Burklow notified.

## 2021-02-08 NOTE — Telephone Encounter (Signed)
PA submitted for Diclofenac Gel

## 2021-02-09 ENCOUNTER — Telehealth: Payer: Self-pay

## 2021-02-09 DIAGNOSIS — I051 Rheumatic mitral insufficiency: Secondary | ICD-10-CM | POA: Diagnosis not present

## 2021-02-09 DIAGNOSIS — E11628 Type 2 diabetes mellitus with other skin complications: Secondary | ICD-10-CM | POA: Diagnosis not present

## 2021-02-09 DIAGNOSIS — Z89431 Acquired absence of right foot: Secondary | ICD-10-CM | POA: Diagnosis not present

## 2021-02-09 DIAGNOSIS — I129 Hypertensive chronic kidney disease with stage 1 through stage 4 chronic kidney disease, or unspecified chronic kidney disease: Secondary | ICD-10-CM | POA: Diagnosis not present

## 2021-02-09 DIAGNOSIS — M069 Rheumatoid arthritis, unspecified: Secondary | ICD-10-CM | POA: Diagnosis not present

## 2021-02-09 DIAGNOSIS — Z4781 Encounter for orthopedic aftercare following surgical amputation: Secondary | ICD-10-CM | POA: Diagnosis not present

## 2021-02-09 DIAGNOSIS — E1151 Type 2 diabetes mellitus with diabetic peripheral angiopathy without gangrene: Secondary | ICD-10-CM | POA: Diagnosis not present

## 2021-02-09 DIAGNOSIS — E1122 Type 2 diabetes mellitus with diabetic chronic kidney disease: Secondary | ICD-10-CM | POA: Diagnosis not present

## 2021-02-09 DIAGNOSIS — E1142 Type 2 diabetes mellitus with diabetic polyneuropathy: Secondary | ICD-10-CM | POA: Diagnosis not present

## 2021-02-09 DIAGNOSIS — E46 Unspecified protein-calorie malnutrition: Secondary | ICD-10-CM | POA: Diagnosis not present

## 2021-02-09 DIAGNOSIS — I6529 Occlusion and stenosis of unspecified carotid artery: Secondary | ICD-10-CM | POA: Diagnosis not present

## 2021-02-09 DIAGNOSIS — N189 Chronic kidney disease, unspecified: Secondary | ICD-10-CM | POA: Diagnosis not present

## 2021-02-09 DIAGNOSIS — Z7952 Long term (current) use of systemic steroids: Secondary | ICD-10-CM | POA: Diagnosis not present

## 2021-02-09 DIAGNOSIS — M810 Age-related osteoporosis without current pathological fracture: Secondary | ICD-10-CM | POA: Diagnosis not present

## 2021-02-09 DIAGNOSIS — Z9181 History of falling: Secondary | ICD-10-CM | POA: Diagnosis not present

## 2021-02-09 DIAGNOSIS — K5790 Diverticulosis of intestine, part unspecified, without perforation or abscess without bleeding: Secondary | ICD-10-CM | POA: Diagnosis not present

## 2021-02-09 DIAGNOSIS — I4891 Unspecified atrial fibrillation: Secondary | ICD-10-CM | POA: Diagnosis not present

## 2021-02-09 DIAGNOSIS — E785 Hyperlipidemia, unspecified: Secondary | ICD-10-CM | POA: Diagnosis not present

## 2021-02-09 DIAGNOSIS — Z8631 Personal history of diabetic foot ulcer: Secondary | ICD-10-CM | POA: Diagnosis not present

## 2021-02-09 DIAGNOSIS — K219 Gastro-esophageal reflux disease without esophagitis: Secondary | ICD-10-CM | POA: Diagnosis not present

## 2021-02-09 LAB — CULTURE, BLOOD (ROUTINE X 2)
Culture: NO GROWTH
Culture: NO GROWTH
Special Requests: ADEQUATE
Special Requests: ADEQUATE

## 2021-02-09 NOTE — Telephone Encounter (Signed)
**Note De-Identified  Obfuscation** As requested, I called [name], the pts daughter and DPR and made her aware that we have faxed the pts application to [facility] signed by Dr Quentin Ore in Dr Lanny Hurst absence.  She thanked me for letting her know.

## 2021-02-09 NOTE — Telephone Encounter (Signed)
**Note De-Identified  Obfuscation** The pts completed [employer] application for Eliquis was left at the office with documents.  Because the cut off date for [employer] is 02/17/21 and Dr Acie Fredrickson will not be back in office until 12/14, I have faxed all to our TTL for today so she can obtain Dr Claudie Revering (DOD) signature, date it, and to fax all to [facility] at the fax number written on the cover letter included or to place in the to be faxed basket in Medical Records to be faxed.

## 2021-02-09 NOTE — Telephone Encounter (Signed)
Forms signed by Dr. Quentin Ore the DOD and faxed in Med Rec to the location on the fax cover sheet per Jeani Hawking Via RN.

## 2021-02-10 DIAGNOSIS — N1832 Chronic kidney disease, stage 3b: Secondary | ICD-10-CM | POA: Diagnosis not present

## 2021-02-10 DIAGNOSIS — M069 Rheumatoid arthritis, unspecified: Secondary | ICD-10-CM | POA: Diagnosis not present

## 2021-02-10 DIAGNOSIS — I480 Paroxysmal atrial fibrillation: Secondary | ICD-10-CM | POA: Diagnosis not present

## 2021-02-10 DIAGNOSIS — I5189 Other ill-defined heart diseases: Secondary | ICD-10-CM | POA: Diagnosis not present

## 2021-02-10 DIAGNOSIS — D649 Anemia, unspecified: Secondary | ICD-10-CM | POA: Diagnosis not present

## 2021-02-10 DIAGNOSIS — E46 Unspecified protein-calorie malnutrition: Secondary | ICD-10-CM | POA: Diagnosis not present

## 2021-02-10 DIAGNOSIS — I739 Peripheral vascular disease, unspecified: Secondary | ICD-10-CM | POA: Diagnosis not present

## 2021-02-10 DIAGNOSIS — I129 Hypertensive chronic kidney disease with stage 1 through stage 4 chronic kidney disease, or unspecified chronic kidney disease: Secondary | ICD-10-CM | POA: Diagnosis not present

## 2021-02-10 DIAGNOSIS — E1140 Type 2 diabetes mellitus with diabetic neuropathy, unspecified: Secondary | ICD-10-CM | POA: Diagnosis not present

## 2021-02-10 DIAGNOSIS — Z794 Long term (current) use of insulin: Secondary | ICD-10-CM | POA: Diagnosis not present

## 2021-02-10 DIAGNOSIS — I6523 Occlusion and stenosis of bilateral carotid arteries: Secondary | ICD-10-CM | POA: Diagnosis not present

## 2021-02-10 DIAGNOSIS — I700 Atherosclerosis of aorta: Secondary | ICD-10-CM | POA: Diagnosis not present

## 2021-02-14 DIAGNOSIS — N189 Chronic kidney disease, unspecified: Secondary | ICD-10-CM | POA: Diagnosis not present

## 2021-02-14 DIAGNOSIS — E1122 Type 2 diabetes mellitus with diabetic chronic kidney disease: Secondary | ICD-10-CM | POA: Diagnosis not present

## 2021-02-14 DIAGNOSIS — Z4781 Encounter for orthopedic aftercare following surgical amputation: Secondary | ICD-10-CM | POA: Diagnosis not present

## 2021-02-14 DIAGNOSIS — E1151 Type 2 diabetes mellitus with diabetic peripheral angiopathy without gangrene: Secondary | ICD-10-CM | POA: Diagnosis not present

## 2021-02-14 DIAGNOSIS — I129 Hypertensive chronic kidney disease with stage 1 through stage 4 chronic kidney disease, or unspecified chronic kidney disease: Secondary | ICD-10-CM | POA: Diagnosis not present

## 2021-02-14 DIAGNOSIS — E11628 Type 2 diabetes mellitus with other skin complications: Secondary | ICD-10-CM | POA: Diagnosis not present

## 2021-02-15 DIAGNOSIS — Z4781 Encounter for orthopedic aftercare following surgical amputation: Secondary | ICD-10-CM | POA: Diagnosis not present

## 2021-02-15 DIAGNOSIS — E1151 Type 2 diabetes mellitus with diabetic peripheral angiopathy without gangrene: Secondary | ICD-10-CM | POA: Diagnosis not present

## 2021-02-15 DIAGNOSIS — E11628 Type 2 diabetes mellitus with other skin complications: Secondary | ICD-10-CM | POA: Diagnosis not present

## 2021-02-15 DIAGNOSIS — N189 Chronic kidney disease, unspecified: Secondary | ICD-10-CM | POA: Diagnosis not present

## 2021-02-15 DIAGNOSIS — E1122 Type 2 diabetes mellitus with diabetic chronic kidney disease: Secondary | ICD-10-CM | POA: Diagnosis not present

## 2021-02-15 DIAGNOSIS — I129 Hypertensive chronic kidney disease with stage 1 through stage 4 chronic kidney disease, or unspecified chronic kidney disease: Secondary | ICD-10-CM | POA: Diagnosis not present

## 2021-02-16 DIAGNOSIS — E11628 Type 2 diabetes mellitus with other skin complications: Secondary | ICD-10-CM | POA: Diagnosis not present

## 2021-02-16 DIAGNOSIS — I129 Hypertensive chronic kidney disease with stage 1 through stage 4 chronic kidney disease, or unspecified chronic kidney disease: Secondary | ICD-10-CM | POA: Diagnosis not present

## 2021-02-16 DIAGNOSIS — E1122 Type 2 diabetes mellitus with diabetic chronic kidney disease: Secondary | ICD-10-CM | POA: Diagnosis not present

## 2021-02-16 DIAGNOSIS — Z4781 Encounter for orthopedic aftercare following surgical amputation: Secondary | ICD-10-CM | POA: Diagnosis not present

## 2021-02-16 DIAGNOSIS — E1151 Type 2 diabetes mellitus with diabetic peripheral angiopathy without gangrene: Secondary | ICD-10-CM | POA: Diagnosis not present

## 2021-02-16 DIAGNOSIS — N189 Chronic kidney disease, unspecified: Secondary | ICD-10-CM | POA: Diagnosis not present

## 2021-02-21 ENCOUNTER — Other Ambulatory Visit: Payer: Self-pay

## 2021-02-21 MED ORDER — CARVEDILOL 6.25 MG PO TABS: 6.2500 mg | ORAL_TABLET | Freq: Two times a day (BID) | ORAL | 2 refills | Status: DC

## 2021-02-21 NOTE — Telephone Encounter (Signed)
Pt's medication was sent to pt's pharmacy as requested. Confirmation received.  °

## 2021-02-23 DIAGNOSIS — Z4781 Encounter for orthopedic aftercare following surgical amputation: Secondary | ICD-10-CM | POA: Diagnosis not present

## 2021-02-23 DIAGNOSIS — I129 Hypertensive chronic kidney disease with stage 1 through stage 4 chronic kidney disease, or unspecified chronic kidney disease: Secondary | ICD-10-CM | POA: Diagnosis not present

## 2021-02-23 DIAGNOSIS — E1122 Type 2 diabetes mellitus with diabetic chronic kidney disease: Secondary | ICD-10-CM | POA: Diagnosis not present

## 2021-02-23 DIAGNOSIS — E1151 Type 2 diabetes mellitus with diabetic peripheral angiopathy without gangrene: Secondary | ICD-10-CM | POA: Diagnosis not present

## 2021-02-23 DIAGNOSIS — N189 Chronic kidney disease, unspecified: Secondary | ICD-10-CM | POA: Diagnosis not present

## 2021-02-23 DIAGNOSIS — E11628 Type 2 diabetes mellitus with other skin complications: Secondary | ICD-10-CM | POA: Diagnosis not present

## 2021-02-24 DIAGNOSIS — I129 Hypertensive chronic kidney disease with stage 1 through stage 4 chronic kidney disease, or unspecified chronic kidney disease: Secondary | ICD-10-CM | POA: Diagnosis not present

## 2021-02-24 DIAGNOSIS — Z4781 Encounter for orthopedic aftercare following surgical amputation: Secondary | ICD-10-CM | POA: Diagnosis not present

## 2021-02-24 DIAGNOSIS — E1151 Type 2 diabetes mellitus with diabetic peripheral angiopathy without gangrene: Secondary | ICD-10-CM | POA: Diagnosis not present

## 2021-02-24 DIAGNOSIS — E11628 Type 2 diabetes mellitus with other skin complications: Secondary | ICD-10-CM | POA: Diagnosis not present

## 2021-02-24 DIAGNOSIS — N189 Chronic kidney disease, unspecified: Secondary | ICD-10-CM | POA: Diagnosis not present

## 2021-02-24 DIAGNOSIS — E1122 Type 2 diabetes mellitus with diabetic chronic kidney disease: Secondary | ICD-10-CM | POA: Diagnosis not present

## 2021-03-01 DIAGNOSIS — E1122 Type 2 diabetes mellitus with diabetic chronic kidney disease: Secondary | ICD-10-CM | POA: Diagnosis not present

## 2021-03-01 DIAGNOSIS — Z4781 Encounter for orthopedic aftercare following surgical amputation: Secondary | ICD-10-CM | POA: Diagnosis not present

## 2021-03-01 DIAGNOSIS — E1151 Type 2 diabetes mellitus with diabetic peripheral angiopathy without gangrene: Secondary | ICD-10-CM | POA: Diagnosis not present

## 2021-03-01 DIAGNOSIS — I129 Hypertensive chronic kidney disease with stage 1 through stage 4 chronic kidney disease, or unspecified chronic kidney disease: Secondary | ICD-10-CM | POA: Diagnosis not present

## 2021-03-01 DIAGNOSIS — N189 Chronic kidney disease, unspecified: Secondary | ICD-10-CM | POA: Diagnosis not present

## 2021-03-01 DIAGNOSIS — E11628 Type 2 diabetes mellitus with other skin complications: Secondary | ICD-10-CM | POA: Diagnosis not present

## 2021-03-02 DIAGNOSIS — E11628 Type 2 diabetes mellitus with other skin complications: Secondary | ICD-10-CM | POA: Diagnosis not present

## 2021-03-02 DIAGNOSIS — Z4781 Encounter for orthopedic aftercare following surgical amputation: Secondary | ICD-10-CM | POA: Diagnosis not present

## 2021-03-02 DIAGNOSIS — N189 Chronic kidney disease, unspecified: Secondary | ICD-10-CM | POA: Diagnosis not present

## 2021-03-02 DIAGNOSIS — I129 Hypertensive chronic kidney disease with stage 1 through stage 4 chronic kidney disease, or unspecified chronic kidney disease: Secondary | ICD-10-CM | POA: Diagnosis not present

## 2021-03-02 DIAGNOSIS — E1122 Type 2 diabetes mellitus with diabetic chronic kidney disease: Secondary | ICD-10-CM | POA: Diagnosis not present

## 2021-03-02 DIAGNOSIS — E1151 Type 2 diabetes mellitus with diabetic peripheral angiopathy without gangrene: Secondary | ICD-10-CM | POA: Diagnosis not present

## 2021-03-03 DIAGNOSIS — I10 Essential (primary) hypertension: Secondary | ICD-10-CM | POA: Diagnosis not present

## 2021-03-03 DIAGNOSIS — Z4781 Encounter for orthopedic aftercare following surgical amputation: Secondary | ICD-10-CM | POA: Diagnosis not present

## 2021-03-03 DIAGNOSIS — N189 Chronic kidney disease, unspecified: Secondary | ICD-10-CM | POA: Diagnosis not present

## 2021-03-03 DIAGNOSIS — I129 Hypertensive chronic kidney disease with stage 1 through stage 4 chronic kidney disease, or unspecified chronic kidney disease: Secondary | ICD-10-CM | POA: Diagnosis not present

## 2021-03-03 DIAGNOSIS — I700 Atherosclerosis of aorta: Secondary | ICD-10-CM | POA: Diagnosis not present

## 2021-03-03 DIAGNOSIS — E1151 Type 2 diabetes mellitus with diabetic peripheral angiopathy without gangrene: Secondary | ICD-10-CM | POA: Diagnosis not present

## 2021-03-03 DIAGNOSIS — E11628 Type 2 diabetes mellitus with other skin complications: Secondary | ICD-10-CM | POA: Diagnosis not present

## 2021-03-03 DIAGNOSIS — I480 Paroxysmal atrial fibrillation: Secondary | ICD-10-CM | POA: Diagnosis not present

## 2021-03-03 DIAGNOSIS — E1122 Type 2 diabetes mellitus with diabetic chronic kidney disease: Secondary | ICD-10-CM | POA: Diagnosis not present

## 2021-03-03 DIAGNOSIS — N1832 Chronic kidney disease, stage 3b: Secondary | ICD-10-CM | POA: Diagnosis not present

## 2021-03-06 DIAGNOSIS — E1122 Type 2 diabetes mellitus with diabetic chronic kidney disease: Secondary | ICD-10-CM | POA: Diagnosis not present

## 2021-03-06 DIAGNOSIS — N189 Chronic kidney disease, unspecified: Secondary | ICD-10-CM | POA: Diagnosis not present

## 2021-03-06 DIAGNOSIS — Z4781 Encounter for orthopedic aftercare following surgical amputation: Secondary | ICD-10-CM | POA: Diagnosis not present

## 2021-03-06 DIAGNOSIS — E1151 Type 2 diabetes mellitus with diabetic peripheral angiopathy without gangrene: Secondary | ICD-10-CM | POA: Diagnosis not present

## 2021-03-06 DIAGNOSIS — I129 Hypertensive chronic kidney disease with stage 1 through stage 4 chronic kidney disease, or unspecified chronic kidney disease: Secondary | ICD-10-CM | POA: Diagnosis not present

## 2021-03-06 DIAGNOSIS — E11628 Type 2 diabetes mellitus with other skin complications: Secondary | ICD-10-CM | POA: Diagnosis not present

## 2021-03-09 DIAGNOSIS — E1151 Type 2 diabetes mellitus with diabetic peripheral angiopathy without gangrene: Secondary | ICD-10-CM | POA: Diagnosis not present

## 2021-03-09 DIAGNOSIS — Z4781 Encounter for orthopedic aftercare following surgical amputation: Secondary | ICD-10-CM | POA: Diagnosis not present

## 2021-03-09 DIAGNOSIS — E1122 Type 2 diabetes mellitus with diabetic chronic kidney disease: Secondary | ICD-10-CM | POA: Diagnosis not present

## 2021-03-09 DIAGNOSIS — E11628 Type 2 diabetes mellitus with other skin complications: Secondary | ICD-10-CM | POA: Diagnosis not present

## 2021-03-09 DIAGNOSIS — I129 Hypertensive chronic kidney disease with stage 1 through stage 4 chronic kidney disease, or unspecified chronic kidney disease: Secondary | ICD-10-CM | POA: Diagnosis not present

## 2021-03-09 DIAGNOSIS — N189 Chronic kidney disease, unspecified: Secondary | ICD-10-CM | POA: Diagnosis not present

## 2021-03-10 DIAGNOSIS — E1151 Type 2 diabetes mellitus with diabetic peripheral angiopathy without gangrene: Secondary | ICD-10-CM | POA: Diagnosis not present

## 2021-03-10 DIAGNOSIS — E1122 Type 2 diabetes mellitus with diabetic chronic kidney disease: Secondary | ICD-10-CM | POA: Diagnosis not present

## 2021-03-10 DIAGNOSIS — I129 Hypertensive chronic kidney disease with stage 1 through stage 4 chronic kidney disease, or unspecified chronic kidney disease: Secondary | ICD-10-CM | POA: Diagnosis not present

## 2021-03-10 DIAGNOSIS — E11628 Type 2 diabetes mellitus with other skin complications: Secondary | ICD-10-CM | POA: Diagnosis not present

## 2021-03-10 DIAGNOSIS — N189 Chronic kidney disease, unspecified: Secondary | ICD-10-CM | POA: Diagnosis not present

## 2021-03-10 DIAGNOSIS — Z4781 Encounter for orthopedic aftercare following surgical amputation: Secondary | ICD-10-CM | POA: Diagnosis not present

## 2021-03-11 DIAGNOSIS — M810 Age-related osteoporosis without current pathological fracture: Secondary | ICD-10-CM | POA: Diagnosis not present

## 2021-03-11 DIAGNOSIS — M069 Rheumatoid arthritis, unspecified: Secondary | ICD-10-CM | POA: Diagnosis not present

## 2021-03-11 DIAGNOSIS — E785 Hyperlipidemia, unspecified: Secondary | ICD-10-CM | POA: Diagnosis not present

## 2021-03-11 DIAGNOSIS — K219 Gastro-esophageal reflux disease without esophagitis: Secondary | ICD-10-CM | POA: Diagnosis not present

## 2021-03-11 DIAGNOSIS — Z7952 Long term (current) use of systemic steroids: Secondary | ICD-10-CM | POA: Diagnosis not present

## 2021-03-11 DIAGNOSIS — I4891 Unspecified atrial fibrillation: Secondary | ICD-10-CM | POA: Diagnosis not present

## 2021-03-11 DIAGNOSIS — Z4781 Encounter for orthopedic aftercare following surgical amputation: Secondary | ICD-10-CM | POA: Diagnosis not present

## 2021-03-11 DIAGNOSIS — N189 Chronic kidney disease, unspecified: Secondary | ICD-10-CM | POA: Diagnosis not present

## 2021-03-11 DIAGNOSIS — E1151 Type 2 diabetes mellitus with diabetic peripheral angiopathy without gangrene: Secondary | ICD-10-CM | POA: Diagnosis not present

## 2021-03-11 DIAGNOSIS — Z8631 Personal history of diabetic foot ulcer: Secondary | ICD-10-CM | POA: Diagnosis not present

## 2021-03-11 DIAGNOSIS — E1122 Type 2 diabetes mellitus with diabetic chronic kidney disease: Secondary | ICD-10-CM | POA: Diagnosis not present

## 2021-03-11 DIAGNOSIS — K5790 Diverticulosis of intestine, part unspecified, without perforation or abscess without bleeding: Secondary | ICD-10-CM | POA: Diagnosis not present

## 2021-03-11 DIAGNOSIS — Z9181 History of falling: Secondary | ICD-10-CM | POA: Diagnosis not present

## 2021-03-11 DIAGNOSIS — E11628 Type 2 diabetes mellitus with other skin complications: Secondary | ICD-10-CM | POA: Diagnosis not present

## 2021-03-11 DIAGNOSIS — E1142 Type 2 diabetes mellitus with diabetic polyneuropathy: Secondary | ICD-10-CM | POA: Diagnosis not present

## 2021-03-11 DIAGNOSIS — I051 Rheumatic mitral insufficiency: Secondary | ICD-10-CM | POA: Diagnosis not present

## 2021-03-11 DIAGNOSIS — Z89431 Acquired absence of right foot: Secondary | ICD-10-CM | POA: Diagnosis not present

## 2021-03-11 DIAGNOSIS — I129 Hypertensive chronic kidney disease with stage 1 through stage 4 chronic kidney disease, or unspecified chronic kidney disease: Secondary | ICD-10-CM | POA: Diagnosis not present

## 2021-03-11 DIAGNOSIS — E46 Unspecified protein-calorie malnutrition: Secondary | ICD-10-CM | POA: Diagnosis not present

## 2021-03-11 DIAGNOSIS — I6529 Occlusion and stenosis of unspecified carotid artery: Secondary | ICD-10-CM | POA: Diagnosis not present

## 2021-03-13 ENCOUNTER — Ambulatory Visit: Payer: Medicare Other | Admitting: Podiatry

## 2021-03-15 ENCOUNTER — Telehealth: Payer: Self-pay | Admitting: Physical Medicine and Rehabilitation

## 2021-03-15 NOTE — Telephone Encounter (Signed)
Pt's daughter  [name] called requesting a call back from [place]. [name] has a couple of medical questions about pt injection. Please call [name] at [phone].

## 2021-03-16 DIAGNOSIS — E1122 Type 2 diabetes mellitus with diabetic chronic kidney disease: Secondary | ICD-10-CM | POA: Diagnosis not present

## 2021-03-16 DIAGNOSIS — E1151 Type 2 diabetes mellitus with diabetic peripheral angiopathy without gangrene: Secondary | ICD-10-CM | POA: Diagnosis not present

## 2021-03-16 DIAGNOSIS — N189 Chronic kidney disease, unspecified: Secondary | ICD-10-CM | POA: Diagnosis not present

## 2021-03-16 DIAGNOSIS — I129 Hypertensive chronic kidney disease with stage 1 through stage 4 chronic kidney disease, or unspecified chronic kidney disease: Secondary | ICD-10-CM | POA: Diagnosis not present

## 2021-03-16 DIAGNOSIS — Z4781 Encounter for orthopedic aftercare following surgical amputation: Secondary | ICD-10-CM | POA: Diagnosis not present

## 2021-03-16 DIAGNOSIS — E11628 Type 2 diabetes mellitus with other skin complications: Secondary | ICD-10-CM | POA: Diagnosis not present

## 2021-03-17 DIAGNOSIS — E1151 Type 2 diabetes mellitus with diabetic peripheral angiopathy without gangrene: Secondary | ICD-10-CM | POA: Diagnosis not present

## 2021-03-17 DIAGNOSIS — E11628 Type 2 diabetes mellitus with other skin complications: Secondary | ICD-10-CM | POA: Diagnosis not present

## 2021-03-17 DIAGNOSIS — N189 Chronic kidney disease, unspecified: Secondary | ICD-10-CM | POA: Diagnosis not present

## 2021-03-17 DIAGNOSIS — I129 Hypertensive chronic kidney disease with stage 1 through stage 4 chronic kidney disease, or unspecified chronic kidney disease: Secondary | ICD-10-CM | POA: Diagnosis not present

## 2021-03-17 DIAGNOSIS — Z4781 Encounter for orthopedic aftercare following surgical amputation: Secondary | ICD-10-CM | POA: Diagnosis not present

## 2021-03-17 DIAGNOSIS — E1122 Type 2 diabetes mellitus with diabetic chronic kidney disease: Secondary | ICD-10-CM | POA: Diagnosis not present

## 2021-03-18 DIAGNOSIS — N186 End stage renal disease: Secondary | ICD-10-CM | POA: Diagnosis not present

## 2021-03-18 DIAGNOSIS — G8929 Other chronic pain: Secondary | ICD-10-CM | POA: Diagnosis not present

## 2021-03-18 DIAGNOSIS — E1122 Type 2 diabetes mellitus with diabetic chronic kidney disease: Secondary | ICD-10-CM | POA: Diagnosis not present

## 2021-03-18 DIAGNOSIS — M109 Gout, unspecified: Secondary | ICD-10-CM | POA: Diagnosis not present

## 2021-03-18 DIAGNOSIS — E1151 Type 2 diabetes mellitus with diabetic peripheral angiopathy without gangrene: Secondary | ICD-10-CM | POA: Diagnosis not present

## 2021-03-18 DIAGNOSIS — I120 Hypertensive chronic kidney disease with stage 5 chronic kidney disease or end stage renal disease: Secondary | ICD-10-CM | POA: Diagnosis not present

## 2021-03-18 DIAGNOSIS — Z4800 Encounter for change or removal of nonsurgical wound dressing: Secondary | ICD-10-CM | POA: Diagnosis not present

## 2021-03-18 DIAGNOSIS — G4733 Obstructive sleep apnea (adult) (pediatric): Secondary | ICD-10-CM | POA: Diagnosis not present

## 2021-03-18 DIAGNOSIS — L97219 Non-pressure chronic ulcer of right calf with unspecified severity: Secondary | ICD-10-CM | POA: Diagnosis not present

## 2021-03-18 DIAGNOSIS — I83012 Varicose veins of right lower extremity with ulcer of calf: Secondary | ICD-10-CM | POA: Diagnosis not present

## 2021-03-18 DIAGNOSIS — I2510 Atherosclerotic heart disease of native coronary artery without angina pectoris: Secondary | ICD-10-CM | POA: Diagnosis not present

## 2021-03-18 DIAGNOSIS — K5900 Constipation, unspecified: Secondary | ICD-10-CM | POA: Diagnosis not present

## 2021-03-20 DIAGNOSIS — E11628 Type 2 diabetes mellitus with other skin complications: Secondary | ICD-10-CM | POA: Diagnosis not present

## 2021-03-20 DIAGNOSIS — E1122 Type 2 diabetes mellitus with diabetic chronic kidney disease: Secondary | ICD-10-CM | POA: Diagnosis not present

## 2021-03-20 DIAGNOSIS — I129 Hypertensive chronic kidney disease with stage 1 through stage 4 chronic kidney disease, or unspecified chronic kidney disease: Secondary | ICD-10-CM | POA: Diagnosis not present

## 2021-03-20 DIAGNOSIS — Z4781 Encounter for orthopedic aftercare following surgical amputation: Secondary | ICD-10-CM | POA: Diagnosis not present

## 2021-03-20 DIAGNOSIS — Z20822 Contact with and (suspected) exposure to covid-19: Secondary | ICD-10-CM | POA: Diagnosis not present

## 2021-03-20 DIAGNOSIS — N189 Chronic kidney disease, unspecified: Secondary | ICD-10-CM | POA: Diagnosis not present

## 2021-03-20 DIAGNOSIS — E1151 Type 2 diabetes mellitus with diabetic peripheral angiopathy without gangrene: Secondary | ICD-10-CM | POA: Diagnosis not present

## 2021-03-22 ENCOUNTER — Telehealth: Payer: Self-pay | Admitting: Cardiovascular Disease

## 2021-03-22 NOTE — Telephone Encounter (Signed)
New Message:      Patient's daughter called and said she need you to refax application please. She said they said the date was left off where the physician signed.

## 2021-03-22 NOTE — Telephone Encounter (Signed)
Completed paperwork faxed 

## 2021-03-22 NOTE — Telephone Encounter (Signed)
**Note De-Identified  Obfuscation** I called [employer] and was advised by Destiny that they do have the pts application for Eliquis but that the providers page was not dated.   I have completed another providers page and have emailed it to Rockney Ghee, RN so she can obtain Dr Hassell Done signature and date it in Dr Elmarie Shiley absence.  [name], the pts daughter is aware.

## 2021-03-22 NOTE — Telephone Encounter (Deleted)
**Note De-Identified  Obfuscation** I called [employer] and was advised by Destiny that they do have the pts application but that the providers page was not dated.  I have completed another providers page and have emailed it to Rockney Ghee, RN so she can obtain his signature and date it in Dr Elmarie Shiley absence.

## 2021-03-23 DIAGNOSIS — N189 Chronic kidney disease, unspecified: Secondary | ICD-10-CM | POA: Diagnosis not present

## 2021-03-23 DIAGNOSIS — Z4781 Encounter for orthopedic aftercare following surgical amputation: Secondary | ICD-10-CM | POA: Diagnosis not present

## 2021-03-23 DIAGNOSIS — E1151 Type 2 diabetes mellitus with diabetic peripheral angiopathy without gangrene: Secondary | ICD-10-CM | POA: Diagnosis not present

## 2021-03-23 DIAGNOSIS — E11628 Type 2 diabetes mellitus with other skin complications: Secondary | ICD-10-CM | POA: Diagnosis not present

## 2021-03-23 DIAGNOSIS — E1122 Type 2 diabetes mellitus with diabetic chronic kidney disease: Secondary | ICD-10-CM | POA: Diagnosis not present

## 2021-03-23 DIAGNOSIS — I129 Hypertensive chronic kidney disease with stage 1 through stage 4 chronic kidney disease, or unspecified chronic kidney disease: Secondary | ICD-10-CM | POA: Diagnosis not present

## 2021-03-27 DIAGNOSIS — E785 Hyperlipidemia, unspecified: Secondary | ICD-10-CM | POA: Diagnosis not present

## 2021-03-27 DIAGNOSIS — N1832 Chronic kidney disease, stage 3b: Secondary | ICD-10-CM | POA: Diagnosis not present

## 2021-03-27 DIAGNOSIS — M810 Age-related osteoporosis without current pathological fracture: Secondary | ICD-10-CM | POA: Diagnosis not present

## 2021-03-27 DIAGNOSIS — E1140 Type 2 diabetes mellitus with diabetic neuropathy, unspecified: Secondary | ICD-10-CM | POA: Diagnosis not present

## 2021-03-27 DIAGNOSIS — I700 Atherosclerosis of aorta: Secondary | ICD-10-CM | POA: Diagnosis not present

## 2021-03-27 DIAGNOSIS — L4050 Arthropathic psoriasis, unspecified: Secondary | ICD-10-CM | POA: Diagnosis not present

## 2021-03-27 DIAGNOSIS — M069 Rheumatoid arthritis, unspecified: Secondary | ICD-10-CM | POA: Diagnosis not present

## 2021-03-27 DIAGNOSIS — I480 Paroxysmal atrial fibrillation: Secondary | ICD-10-CM | POA: Diagnosis not present

## 2021-03-27 DIAGNOSIS — I679 Cerebrovascular disease, unspecified: Secondary | ICD-10-CM | POA: Diagnosis not present

## 2021-03-27 DIAGNOSIS — K760 Fatty (change of) liver, not elsewhere classified: Secondary | ICD-10-CM | POA: Diagnosis not present

## 2021-03-27 DIAGNOSIS — I6523 Occlusion and stenosis of bilateral carotid arteries: Secondary | ICD-10-CM | POA: Diagnosis not present

## 2021-03-27 DIAGNOSIS — I130 Hypertensive heart and chronic kidney disease with heart failure and stage 1 through stage 4 chronic kidney disease, or unspecified chronic kidney disease: Secondary | ICD-10-CM | POA: Diagnosis not present

## 2021-03-28 NOTE — Telephone Encounter (Signed)
**Note De-Identified  Obfuscation** Letter received from [facility] stating that they have denied the pt asst with Eliquis at this time. Reason: Documentation of 3% out of pocket RX expenses, based on household adjusted gross income, not met.  The letter states that they have notified the pt of this as well.

## 2021-03-29 DIAGNOSIS — N189 Chronic kidney disease, unspecified: Secondary | ICD-10-CM | POA: Diagnosis not present

## 2021-03-29 DIAGNOSIS — E1151 Type 2 diabetes mellitus with diabetic peripheral angiopathy without gangrene: Secondary | ICD-10-CM | POA: Diagnosis not present

## 2021-03-29 DIAGNOSIS — I129 Hypertensive chronic kidney disease with stage 1 through stage 4 chronic kidney disease, or unspecified chronic kidney disease: Secondary | ICD-10-CM | POA: Diagnosis not present

## 2021-03-29 DIAGNOSIS — E11628 Type 2 diabetes mellitus with other skin complications: Secondary | ICD-10-CM | POA: Diagnosis not present

## 2021-03-29 DIAGNOSIS — E1122 Type 2 diabetes mellitus with diabetic chronic kidney disease: Secondary | ICD-10-CM | POA: Diagnosis not present

## 2021-03-29 DIAGNOSIS — Z4781 Encounter for orthopedic aftercare following surgical amputation: Secondary | ICD-10-CM | POA: Diagnosis not present

## 2021-03-30 DIAGNOSIS — Z4781 Encounter for orthopedic aftercare following surgical amputation: Secondary | ICD-10-CM | POA: Diagnosis not present

## 2021-03-30 DIAGNOSIS — E1151 Type 2 diabetes mellitus with diabetic peripheral angiopathy without gangrene: Secondary | ICD-10-CM | POA: Diagnosis not present

## 2021-03-30 DIAGNOSIS — N189 Chronic kidney disease, unspecified: Secondary | ICD-10-CM | POA: Diagnosis not present

## 2021-03-30 DIAGNOSIS — I129 Hypertensive chronic kidney disease with stage 1 through stage 4 chronic kidney disease, or unspecified chronic kidney disease: Secondary | ICD-10-CM | POA: Diagnosis not present

## 2021-03-30 DIAGNOSIS — E11628 Type 2 diabetes mellitus with other skin complications: Secondary | ICD-10-CM | POA: Diagnosis not present

## 2021-03-30 DIAGNOSIS — E1122 Type 2 diabetes mellitus with diabetic chronic kidney disease: Secondary | ICD-10-CM | POA: Diagnosis not present

## 2021-03-31 DIAGNOSIS — E1151 Type 2 diabetes mellitus with diabetic peripheral angiopathy without gangrene: Secondary | ICD-10-CM | POA: Diagnosis not present

## 2021-03-31 DIAGNOSIS — I129 Hypertensive chronic kidney disease with stage 1 through stage 4 chronic kidney disease, or unspecified chronic kidney disease: Secondary | ICD-10-CM | POA: Diagnosis not present

## 2021-03-31 DIAGNOSIS — E11628 Type 2 diabetes mellitus with other skin complications: Secondary | ICD-10-CM | POA: Diagnosis not present

## 2021-03-31 DIAGNOSIS — E1122 Type 2 diabetes mellitus with diabetic chronic kidney disease: Secondary | ICD-10-CM | POA: Diagnosis not present

## 2021-03-31 DIAGNOSIS — N189 Chronic kidney disease, unspecified: Secondary | ICD-10-CM | POA: Diagnosis not present

## 2021-03-31 DIAGNOSIS — Z4781 Encounter for orthopedic aftercare following surgical amputation: Secondary | ICD-10-CM | POA: Diagnosis not present

## 2021-04-02 DIAGNOSIS — I700 Atherosclerosis of aorta: Secondary | ICD-10-CM | POA: Diagnosis not present

## 2021-04-02 DIAGNOSIS — N1832 Chronic kidney disease, stage 3b: Secondary | ICD-10-CM | POA: Diagnosis not present

## 2021-04-02 DIAGNOSIS — I480 Paroxysmal atrial fibrillation: Secondary | ICD-10-CM | POA: Diagnosis not present

## 2021-04-02 DIAGNOSIS — I10 Essential (primary) hypertension: Secondary | ICD-10-CM | POA: Diagnosis not present

## 2021-04-03 DIAGNOSIS — E1122 Type 2 diabetes mellitus with diabetic chronic kidney disease: Secondary | ICD-10-CM | POA: Diagnosis not present

## 2021-04-03 DIAGNOSIS — N189 Chronic kidney disease, unspecified: Secondary | ICD-10-CM | POA: Diagnosis not present

## 2021-04-03 DIAGNOSIS — Z4781 Encounter for orthopedic aftercare following surgical amputation: Secondary | ICD-10-CM | POA: Diagnosis not present

## 2021-04-03 DIAGNOSIS — E1151 Type 2 diabetes mellitus with diabetic peripheral angiopathy without gangrene: Secondary | ICD-10-CM | POA: Diagnosis not present

## 2021-04-03 DIAGNOSIS — I129 Hypertensive chronic kidney disease with stage 1 through stage 4 chronic kidney disease, or unspecified chronic kidney disease: Secondary | ICD-10-CM | POA: Diagnosis not present

## 2021-04-03 DIAGNOSIS — E11628 Type 2 diabetes mellitus with other skin complications: Secondary | ICD-10-CM | POA: Diagnosis not present

## 2021-04-05 DIAGNOSIS — E1151 Type 2 diabetes mellitus with diabetic peripheral angiopathy without gangrene: Secondary | ICD-10-CM | POA: Diagnosis not present

## 2021-04-05 DIAGNOSIS — E11628 Type 2 diabetes mellitus with other skin complications: Secondary | ICD-10-CM | POA: Diagnosis not present

## 2021-04-05 DIAGNOSIS — N189 Chronic kidney disease, unspecified: Secondary | ICD-10-CM | POA: Diagnosis not present

## 2021-04-05 DIAGNOSIS — I129 Hypertensive chronic kidney disease with stage 1 through stage 4 chronic kidney disease, or unspecified chronic kidney disease: Secondary | ICD-10-CM | POA: Diagnosis not present

## 2021-04-05 DIAGNOSIS — Z4781 Encounter for orthopedic aftercare following surgical amputation: Secondary | ICD-10-CM | POA: Diagnosis not present

## 2021-04-05 DIAGNOSIS — E1122 Type 2 diabetes mellitus with diabetic chronic kidney disease: Secondary | ICD-10-CM | POA: Diagnosis not present

## 2021-04-06 ENCOUNTER — Encounter: Payer: Self-pay | Admitting: Physical Medicine and Rehabilitation

## 2021-04-06 ENCOUNTER — Other Ambulatory Visit: Payer: Self-pay

## 2021-04-06 ENCOUNTER — Ambulatory Visit: Payer: Self-pay

## 2021-04-06 ENCOUNTER — Ambulatory Visit (INDEPENDENT_AMBULATORY_CARE_PROVIDER_SITE_OTHER): Payer: Medicare Other | Admitting: Physical Medicine and Rehabilitation

## 2021-04-06 VITALS — BP 130/53 | HR 80

## 2021-04-06 DIAGNOSIS — N189 Chronic kidney disease, unspecified: Secondary | ICD-10-CM | POA: Diagnosis not present

## 2021-04-06 DIAGNOSIS — E11628 Type 2 diabetes mellitus with other skin complications: Secondary | ICD-10-CM | POA: Diagnosis not present

## 2021-04-06 DIAGNOSIS — M5416 Radiculopathy, lumbar region: Secondary | ICD-10-CM | POA: Diagnosis not present

## 2021-04-06 DIAGNOSIS — E1151 Type 2 diabetes mellitus with diabetic peripheral angiopathy without gangrene: Secondary | ICD-10-CM | POA: Diagnosis not present

## 2021-04-06 DIAGNOSIS — Z4781 Encounter for orthopedic aftercare following surgical amputation: Secondary | ICD-10-CM | POA: Diagnosis not present

## 2021-04-06 DIAGNOSIS — E1122 Type 2 diabetes mellitus with diabetic chronic kidney disease: Secondary | ICD-10-CM | POA: Diagnosis not present

## 2021-04-06 DIAGNOSIS — I129 Hypertensive chronic kidney disease with stage 1 through stage 4 chronic kidney disease, or unspecified chronic kidney disease: Secondary | ICD-10-CM | POA: Diagnosis not present

## 2021-04-06 MED ORDER — METHYLPREDNISOLONE ACETATE 80 MG/ML IJ SUSP
80.0000 mg | Freq: Once | INTRAMUSCULAR | Status: AC
  Administered 2021-04-06: 80 mg

## 2021-04-06 NOTE — Progress Notes (Signed)
Pt state lower back pain that travels to her left hip and down her leg to the foot. Pt state walking, standing and bending makes the pain worse. Pt state she takes pain meds to help ease her pain.  Numeric Pain Rating Scale and Functional Assessment Average Pain 7   In the last MONTH (on 0-10 scale) has pain interfered with the following?  1. General activity like being  able to carry out your everyday physical activities such as walking, climbing stairs, carrying groceries, or moving a chair?  Rating(10)   +Driver, +BT, -Dye Allergies.

## 2021-04-06 NOTE — Patient Instructions (Signed)

## 2021-04-07 ENCOUNTER — Encounter: Payer: Self-pay | Admitting: Internal Medicine

## 2021-04-07 ENCOUNTER — Ambulatory Visit (INDEPENDENT_AMBULATORY_CARE_PROVIDER_SITE_OTHER): Payer: Medicare Other | Admitting: Internal Medicine

## 2021-04-07 ENCOUNTER — Telehealth: Payer: Self-pay | Admitting: Cardiovascular Disease

## 2021-04-07 VITALS — BP 122/52 | HR 82 | Ht 61.5 in | Wt 124.0 lb

## 2021-04-07 DIAGNOSIS — R079 Chest pain, unspecified: Secondary | ICD-10-CM

## 2021-04-07 DIAGNOSIS — M79672 Pain in left foot: Secondary | ICD-10-CM | POA: Diagnosis not present

## 2021-04-07 DIAGNOSIS — I480 Paroxysmal atrial fibrillation: Secondary | ICD-10-CM

## 2021-04-07 DIAGNOSIS — M25562 Pain in left knee: Secondary | ICD-10-CM | POA: Diagnosis not present

## 2021-04-07 NOTE — Patient Instructions (Signed)
Medication Instructions:  Your physician recommends that you continue on your current medications as directed. Please refer to the Current Medication list given to you today.  *If you need a refill on your cardiac medications before your next appointment, please call your pharmacy*   Lab Work: None ordered.  If you have labs (blood work) drawn today and your tests are completely normal, you will receive your results only by: [place] (if you have MyChart) OR A paper copy in the mail If you have any lab test that is abnormal or we need to change your treatment, we will call you to review the results.   Testing/Procedures: Loop Recorder Implant   Follow-Up: At [facility], you and your health needs are our priority.  As part of our continuing mission to provide you with exceptional heart care, we have created designated Provider Care Teams.  These Care Teams include your primary Cardiologist (physician) and Advanced Practice Providers (APPs -  Physician Assistants and Nurse Practitioners) who all work together to provide you with the care you need, when you need it.  We recommend signing up for the patient portal called "MyChart".  Sign up information is provided on this After Visit Summary.  MyChart is used to connect with patients for Virtual Visits (Telemedicine).  Patients are able to view lab/test results, encounter notes, upcoming appointments, etc.  Non-urgent messages can be sent to your provider as well.   To learn more about what you can do with MyChart, go to [email or web address].    Your next appointment:   Dr Olin Pia scheduler will contact you once we have approval from your [employer]

## 2021-04-07 NOTE — Progress Notes (Signed)
\      ELECTROPHYSIOLOGY OFFICE  NOTE         Patient Care Team: Reynold Bowen, MD as PCP - General (Endocrinology) Nahser, Wonda Cheng, MD as PCP - Cardiology (Cardiology)   HPI  Shelby Walker is a 88 y.o. female the wife of Mr. Shelby Walker (died Apr 19, 2022) who is seen today because of chest pain that developed following a fall against a chair.  Some pleuritic discomfort   Has a history of peripheral vascular disease and rheumatoid arthritis and diabetes.  11/22 underwent transmetatarsal amputation of her right foot   DATE TEST EF   10/17 Myoview  77 %   5/20 Echo  60-65 %   8/21 Echo 60-65%           Date Cr K Hgb  12/22 1.25 4.2 8.3            Occasionally she has other chest pains not related to her recent fall. These episodes may occur randomly when she is sitting down, with a typical duration of 5-10 minutes. Taking nitroglycerin provides relief.  Dyspnea on exertion which is worsening and accompanied by chest discomfort  She confirms having Afib that was diagnosed in 2020 while hospitalized with C. difficile.  Recurrent palpitations irregularly for a few minutes at a time.  The patient denies nocturnal dyspnea, orthopnea or peripheral edema.  There have been no lightheadedness or syncope.  Thromboembolic risk factors ( age  -2, HTN-1, DM-1, Vasc disease 1, Gender-1) for a CHADSVASc Score of >=6  She has been in physical therapy for her bilateral UE weakness. Her ROM has improved significantly.   Records and Results Reviewed  Past Medical History:  Diagnosis Date   Bruit    Carotid Doppler showed no significant abnormality     9per patient)   C. difficile colitis 07/2018   with severe sepsis   Chest pain, unspecified    Nuclear, May, 2008, no scar or ischemia   Diabetes mellitus    Diverticulosis    Dyslipidemia    Ejection fraction    EF 55-60%, echo, February, 2011 // Echocardiogram 8/21: EF 60-65, no RWMA, Gr 1 DD, GLS -14%, normal RVSF, mild LAE,  trivial MR, mild MS (mean 4 mmHg), RVSP 23.4    GERD (gastroesophageal reflux disease)    Mitral regurgitation April 21, 2009   mild,  echo, February, 2011   Osteoporosis    Palpitations    possible very brief atrial fibrillation on monitor and possible reentrant tachycardia   Psoriasis    Rheumatoid arthritis ([place])     Past Surgical History:  Procedure Laterality Date   ABDOMINAL AORTOGRAM W/LOWER EXTREMITY Right 10/19/2020   Procedure: ABDOMINAL AORTOGRAM W/LOWER EXTREMITY;  Surgeon: Wellington Hampshire, MD;  Location: [facility];  Service: Cardiovascular;  Laterality: Right;   FRACTURE SURGERY     TRANSMETATARSAL AMPUTATION Right 01/18/2021   Procedure: TRANSMETATARSAL AMPUTATION;  Surgeon: Wylene Simmer, MD;  Location: [facility];  Service: Orthopedics;  Laterality: Right;    Current Meds  Medication Sig   acetaminophen-codeine (TYLENOL #3) 300-30 MG tablet Take 1 tablet by mouth in the morning, at noon, and at bedtime.   albuterol (VENTOLIN HFA) 108 (90 Base) MCG/ACT inhaler Inhale 1-2 puffs into the lungs every 6 (six) hours as needed for wheezing or shortness of breath.   apixaban (ELIQUIS) 2.5 MG TABS tablet Take 1 tablet by mouth 2 times daily   atorvastatin (LIPITOR) 20 MG tablet Take 1 tablet  by mouth once daily   Calcium Carbonate-Vitamin D 600-400 MG-UNIT tablet Take 1 tablet by mouth daily at 12 noon.    carvedilol (COREG) 6.25 MG tablet Take 1 tablet (6.25 mg total) by mouth 2 (two) times daily with a meal.   diclofenac Sodium (VOLTAREN) 1 % GEL Apply 2 g topically 4 (four) times daily.   esomeprazole (NEXIUM) 20 MG capsule Take 20 mg by mouth in the morning.   gabapentin (NEURONTIN) 100 MG capsule Take 1 capsule (100 mg total) by mouth 3 (three) times daily.   insulin aspart (NOVOLOG) 100 UNIT/ML FlexPen Inject 3 Units into the skin 3 (three) times daily with meals. Sliding scale   Insulin Degludec (TRESIBA [place]) Inject 6-8 Units into the skin at bedtime. Sliding scale    lidocaine (LIDODERM) 5 % Place 1 patch onto the skin daily. Purchase over the counter --on for 12 hours and off for 12 hours   lip balm (CARMEX) ointment Apply topically as needed for lip care.   liver oil-zinc oxide (DESITIN) 40 % ointment Apply 1 application topically 3 (three) times daily. To raw areas in between/on buttocks   Multiple Vitamins-Minerals (CENTRUM SILVER 50+WOMEN PO) Take 1 tablet by mouth daily.   nitroGLYCERIN (NITROSTAT) 0.4 MG SL tablet Place 1 tablet (0.4 mg total) under the tongue every 5 (five) minutes as needed for chest pain.   Nutritional Supplements (ENSURE ACTIVE) LIQD Take 237 mLs by mouth in the morning. Prefers Chocolate   predniSONE (DELTASONE) 1 MG tablet Take 4 mg by mouth in the morning.   pyrithione zinc (SELSUN BLUE DRY SCALP) 1 % shampoo Apply 1 application topically once a week.   RESTASIS 0.05 % ophthalmic emulsion Place 1 drop into both eyes 2 (two) times daily.   saccharomyces boulardii (FLORASTOR) 250 MG capsule Take 250 mg by mouth daily as needed (to take when taking an antibiotic).   Current Facility-Administered Medications for the 04/07/21 encounter (Office Visit) with Deboraha Sprang, MD  Medication   methylPREDNISolone acetate (DEPO-MEDROL) injection 80 mg    Allergies  Allergen Reactions   Cholestyramine     Possible- Makes throat burn. Patient said she can't take it    Compazine [Prochlorperazine Edisylate] Swelling    REACTION: " tongue swell and unable to swallow"   Sulfonamide Derivatives Other (See Comments)    REACTION: " broke out with fine itching bumps"   Hydrocodone Other (See Comments)   Infliximab Other (See Comments)    Other reaction(s): rash   Prochlorperazine Other (See Comments)    Other reaction(s): Unknown   Rosuvastatin     Other reaction(s): muscle aches   Sulfa Antibiotics Other (See Comments)    Other reaction(s): tongue swelling      Review of Systems negative except from HPI and PMH  Physical  Exam BP (!) 122/52 (BP Location: Left Arm, Patient Position: Sitting, Cuff Size: Normal)    Pulse 82    Ht 5' 1.5" (1.562 m)    Wt 124 lb (56.2 kg)    SpO2 98%    BMI 23.05 kg/m  Well developed and nourished in no acute distress HENT normal Neck supple with JVP-  flat  Clear Regular rate and rhythm, no murmurs or gallops Abd-soft with active BS No Clubbing cyanosis edema Arthritic deformities Skin-warm and dry A & Oriented  Grossly normal sensory and motor function  ECG sinus @ 82 15/08/36   CrCl cannot be calculated (Patient's most recent lab result is older than the maximum  21 days allowed.).   Assessment and  Plan  Atrial fibrillation-persistent  Fall with chest wall pain pleuritic component  Peripheral vascular disease with prior revascularization  Dyspnea on exertion   Atrial fibrillation occurred in context of C. difficile.  Results from [place] [employer] study suggests that these people are high risk for recurrence of atrial fibrillation but as best as we know this lady has had none; hence, we have discussed the role of an implantable loop recorder to ascertain an atrial fibrillation burden so as to determine if she needs anticoagulation ongoingly.  No ongoing bleeding. Continue her Eliquis to 2.5 mg a day for now  She is euvolemic.  Dyspnea on exertion is likely multifactorial.  I worry in the context of a diffuse vascular disease that she does not have concomitant coronary disease.  She is averse to nuclear scanning; I will check with the imaging coordinator to see what their concerns are with a creatinine of 1.16 in this 88 year old lady.   Current medicines are reviewed at length with the patient today .  The patient does not  have concerns regarding medicines.  I,Shelby Walker,acting as a scribe for Shelby Axe, MD.,have documented all relevant documentation on the behalf of Shelby Axe, MD,as directed by  Shelby Axe, MD while in the presence of Shelby Axe,  MD.  I, Shelby Axe, MD, have reviewed all documentation for this visit. The documentation on 04/07/21 for the exam, diagnosis, procedures, and orders are all accurate and complete.

## 2021-04-07 NOTE — Telephone Encounter (Signed)
Patient fell. She hit her left breast towards the center of her chest. She had an x-ray done at [facility] done. No ribs were broken but Dr.Lockamy wanted the patient to be checked out by Cardiology today.   Daughter was not pleased that the first available appt was not until 04/17/21. She wants her mother seen today

## 2021-04-07 NOTE — Telephone Encounter (Signed)
Called daughter back (DPR). Patient fell this morning and landed on her knee and left side of chest. Patient is now complaining of left side chest pain, and it hurts when taking a deep breath. Made patient an appointment with DOD, Dr. Caryl Comes to see patient and evaluate. Informed her to go to ED if her symptoms get worse.

## 2021-04-10 DIAGNOSIS — E46 Unspecified protein-calorie malnutrition: Secondary | ICD-10-CM | POA: Diagnosis not present

## 2021-04-10 DIAGNOSIS — Z89431 Acquired absence of right foot: Secondary | ICD-10-CM | POA: Diagnosis not present

## 2021-04-10 DIAGNOSIS — I6529 Occlusion and stenosis of unspecified carotid artery: Secondary | ICD-10-CM | POA: Diagnosis not present

## 2021-04-10 DIAGNOSIS — Z7952 Long term (current) use of systemic steroids: Secondary | ICD-10-CM | POA: Diagnosis not present

## 2021-04-10 DIAGNOSIS — E1151 Type 2 diabetes mellitus with diabetic peripheral angiopathy without gangrene: Secondary | ICD-10-CM | POA: Diagnosis not present

## 2021-04-10 DIAGNOSIS — E1142 Type 2 diabetes mellitus with diabetic polyneuropathy: Secondary | ICD-10-CM | POA: Diagnosis not present

## 2021-04-10 DIAGNOSIS — I4891 Unspecified atrial fibrillation: Secondary | ICD-10-CM | POA: Diagnosis not present

## 2021-04-10 DIAGNOSIS — I129 Hypertensive chronic kidney disease with stage 1 through stage 4 chronic kidney disease, or unspecified chronic kidney disease: Secondary | ICD-10-CM | POA: Diagnosis not present

## 2021-04-10 DIAGNOSIS — Z791 Long term (current) use of non-steroidal anti-inflammatories (NSAID): Secondary | ICD-10-CM | POA: Diagnosis not present

## 2021-04-10 DIAGNOSIS — Z4781 Encounter for orthopedic aftercare following surgical amputation: Secondary | ICD-10-CM | POA: Diagnosis not present

## 2021-04-10 DIAGNOSIS — M810 Age-related osteoporosis without current pathological fracture: Secondary | ICD-10-CM | POA: Diagnosis not present

## 2021-04-10 DIAGNOSIS — Z9181 History of falling: Secondary | ICD-10-CM | POA: Diagnosis not present

## 2021-04-10 DIAGNOSIS — Z7901 Long term (current) use of anticoagulants: Secondary | ICD-10-CM | POA: Diagnosis not present

## 2021-04-10 DIAGNOSIS — E1122 Type 2 diabetes mellitus with diabetic chronic kidney disease: Secondary | ICD-10-CM | POA: Diagnosis not present

## 2021-04-10 DIAGNOSIS — I051 Rheumatic mitral insufficiency: Secondary | ICD-10-CM | POA: Diagnosis not present

## 2021-04-10 DIAGNOSIS — E785 Hyperlipidemia, unspecified: Secondary | ICD-10-CM | POA: Diagnosis not present

## 2021-04-10 DIAGNOSIS — E11628 Type 2 diabetes mellitus with other skin complications: Secondary | ICD-10-CM | POA: Diagnosis not present

## 2021-04-10 DIAGNOSIS — Z79891 Long term (current) use of opiate analgesic: Secondary | ICD-10-CM | POA: Diagnosis not present

## 2021-04-10 DIAGNOSIS — Z794 Long term (current) use of insulin: Secondary | ICD-10-CM | POA: Diagnosis not present

## 2021-04-10 DIAGNOSIS — K219 Gastro-esophageal reflux disease without esophagitis: Secondary | ICD-10-CM | POA: Diagnosis not present

## 2021-04-10 DIAGNOSIS — M069 Rheumatoid arthritis, unspecified: Secondary | ICD-10-CM | POA: Diagnosis not present

## 2021-04-10 DIAGNOSIS — Z8631 Personal history of diabetic foot ulcer: Secondary | ICD-10-CM | POA: Diagnosis not present

## 2021-04-10 DIAGNOSIS — K5790 Diverticulosis of intestine, part unspecified, without perforation or abscess without bleeding: Secondary | ICD-10-CM | POA: Diagnosis not present

## 2021-04-10 DIAGNOSIS — N189 Chronic kidney disease, unspecified: Secondary | ICD-10-CM | POA: Diagnosis not present

## 2021-04-11 DIAGNOSIS — E11628 Type 2 diabetes mellitus with other skin complications: Secondary | ICD-10-CM | POA: Diagnosis not present

## 2021-04-11 DIAGNOSIS — E1122 Type 2 diabetes mellitus with diabetic chronic kidney disease: Secondary | ICD-10-CM | POA: Diagnosis not present

## 2021-04-11 DIAGNOSIS — L409 Psoriasis, unspecified: Secondary | ICD-10-CM | POA: Diagnosis not present

## 2021-04-11 DIAGNOSIS — M810 Age-related osteoporosis without current pathological fracture: Secondary | ICD-10-CM | POA: Diagnosis not present

## 2021-04-11 DIAGNOSIS — Z4781 Encounter for orthopedic aftercare following surgical amputation: Secondary | ICD-10-CM | POA: Diagnosis not present

## 2021-04-11 DIAGNOSIS — M5136 Other intervertebral disc degeneration, lumbar region: Secondary | ICD-10-CM | POA: Diagnosis not present

## 2021-04-11 DIAGNOSIS — N189 Chronic kidney disease, unspecified: Secondary | ICD-10-CM | POA: Diagnosis not present

## 2021-04-11 DIAGNOSIS — M150 Primary generalized (osteo)arthritis: Secondary | ICD-10-CM | POA: Diagnosis not present

## 2021-04-11 DIAGNOSIS — M7062 Trochanteric bursitis, left hip: Secondary | ICD-10-CM | POA: Diagnosis not present

## 2021-04-11 DIAGNOSIS — Z79899 Other long term (current) drug therapy: Secondary | ICD-10-CM | POA: Diagnosis not present

## 2021-04-11 DIAGNOSIS — M069 Rheumatoid arthritis, unspecified: Secondary | ICD-10-CM | POA: Diagnosis not present

## 2021-04-11 DIAGNOSIS — I129 Hypertensive chronic kidney disease with stage 1 through stage 4 chronic kidney disease, or unspecified chronic kidney disease: Secondary | ICD-10-CM | POA: Diagnosis not present

## 2021-04-11 DIAGNOSIS — Z6823 Body mass index (BMI) 23.0-23.9, adult: Secondary | ICD-10-CM | POA: Diagnosis not present

## 2021-04-11 DIAGNOSIS — E1151 Type 2 diabetes mellitus with diabetic peripheral angiopathy without gangrene: Secondary | ICD-10-CM | POA: Diagnosis not present

## 2021-04-12 ENCOUNTER — Encounter: Payer: Self-pay | Admitting: Internal Medicine

## 2021-04-12 ENCOUNTER — Telehealth: Payer: Self-pay | Admitting: *Deleted

## 2021-04-12 NOTE — Telephone Encounter (Signed)
Pt dauter called stating she went to check on pt and pt was still in bed and when she asked her reason why in bed the pt says her leg is still hurting and is unbearable, daughter is concerned/worried she could have a blood clot. Please advise  CB [phone]

## 2021-04-12 NOTE — Telephone Encounter (Signed)
Daughter aware she is on elliquis, she's going to have swelling  Elevate and ice and if no better call me back

## 2021-04-12 NOTE — Telephone Encounter (Signed)
Pt daughter called wanting to see if pt can be seen today due to a fall that happened on Friday and since she could not be seen Friday she took her to [facility] and they took xrays and no fracture was seen, she does have a lot of swelling and on eliquis. Daughter states over weekend she started having pain again in left knee and wants to know if could be seen today.   I spoke to [place] and she checked the schedule for today and both providers do not have anything so just wanted to tell her that to give the swelling time to heal and elevate and ice and she can try to work in next week if no better.   Pt daughter called back told her everything Caryl Pina as told me and she understood and will call back if pt has any other issues.

## 2021-04-13 ENCOUNTER — Other Ambulatory Visit: Payer: Self-pay

## 2021-04-13 DIAGNOSIS — M79605 Pain in left leg: Secondary | ICD-10-CM

## 2021-04-13 DIAGNOSIS — M25562 Pain in left knee: Secondary | ICD-10-CM

## 2021-04-13 NOTE — Telephone Encounter (Signed)
Scheduling patient for CT scan

## 2021-04-15 NOTE — Progress Notes (Signed)
Shelby Walker - 88 y.o. female MRN [id number]  Date of birth: 04-10-1933  Office Visit Note: Visit Date: 04/06/2021 PCP: Reynold Bowen, MD Referred by: Reynold Bowen, MD  Subjective: Chief Complaint  Patient presents with   Lower Back - Pain   Left Hip - Pain   Left Leg - Pain   Left Foot - Pain   HPI:  Shelby Walker is a 88 y.o. female who comes in today for planned repeat Left L3-4  Lumbar Transforaminal epidural steroid injection with fluoroscopic guidance.  The patient has failed conservative care including home exercise, medications, time and activity modification.  This injection will be diagnostic and hopefully therapeutic.  Please see requesting physician notes for further details and justification. Patient received more than 50% pain relief from prior injection.   Referring: Dr. Jean Rosenthal   ROS Otherwise per HPI.  Assessment & Plan: Visit Diagnoses:    ICD-10-CM   1. Lumbar radiculopathy  M54.16 XR C-ARM NO REPORT    Epidural Steroid injection    methylPREDNISolone acetate (DEPO-MEDROL) injection 80 mg      Plan: No additional findings.   Meds & Orders:  Meds ordered this encounter  Medications   methylPREDNISolone acetate (DEPO-MEDROL) injection 80 mg    Orders Placed This Encounter  Procedures   XR C-ARM NO REPORT   Epidural Steroid injection    Follow-up: Return if symptoms worsen or fail to improve.   Procedures: No procedures performed  Lumbosacral Transforaminal Epidural Steroid Injection - Sub-Pedicular Approach with Fluoroscopic Guidance  Patient: Shelby Walker      Date of Birth: 01/03/1934 MRN: [id number] PCP: Reynold Bowen, MD      Visit Date: 04/06/2021   Universal Protocol:    Date/Time: 04/06/2021  Consent Given By: the patient  Position: PRONE  Additional Comments: Vital signs were monitored before and after the procedure. Patient was prepped and draped in the usual sterile fashion. The correct patient, procedure, and site  was verified.   Injection Procedure Details:   Procedure diagnoses: Lumbar radiculopathy [M54.16]    Meds Administered:  Meds ordered this encounter  Medications   methylPREDNISolone acetate (DEPO-MEDROL) injection 80 mg    Laterality: Left  Location/Site: L3  Needle:5.0 in., 22 ga.  Short bevel or Quincke spinal needle  Needle Placement: Transforaminal  Findings:    -Comments: Excellent flow of contrast along the nerve, nerve root and into the epidural space.  Procedure Details: After squaring off the end-plates to get a true AP view, the C-arm was positioned so that an oblique view of the foramen as noted above was visualized. The target area is just inferior to the "nose of the scotty dog" or sub pedicular. The soft tissues overlying this structure were infiltrated with 2-3 ml. of 1% Lidocaine without Epinephrine.  The spinal needle was inserted toward the target using a "trajectory" view along the fluoroscope beam.  Under AP and lateral visualization, the needle was advanced so it did not puncture dura and was located close the 6 O'Clock position of the pedical in AP tracterory. Biplanar projections were used to confirm position. Aspiration was confirmed to be negative for CSF and/or blood. A 1-2 ml. volume of Isovue-250 was injected and flow of contrast was noted at each level. Radiographs were obtained for documentation purposes.   After attaining the desired flow of contrast documented above, a 0.5 to 1.0 ml test dose of 0.25% Marcaine was injected into each respective transforaminal space.  The patient was  observed for 90 seconds post injection.  After no sensory deficits were reported, and normal lower extremity motor function was noted,   the above injectate was administered so that equal amounts of the injectate were placed at each foramen (level) into the transforaminal epidural space.   Additional Comments:  The patient tolerated the procedure well Dressing: 2 x 2  sterile gauze and Band-Aid    Post-procedure details: Patient was observed during the procedure. Post-procedure instructions were reviewed.  Patient left the clinic in stable condition.    Clinical History: MRI LUMBAR SPINE WITHOUT CONTRAST   TECHNIQUE: Multiplanar, multisequence MR imaging of the lumbar spine was performed. No intravenous contrast was administered.   COMPARISON:  RADIOGRAPHY OF THE LUMBAR DATED 11/16/2020. RADIOGRAPHY OF THE LUMBAR DATED 08/07/2018.   FINDINGS: Segmentation:  Standard.   Alignment: Grade 1 anterolisthesis of L3 on L4, L4 on L5 and L5-S1 secondary to facet disease.   Vertebrae: No acute fracture, evidence of discitis, or aggressive bone lesion.   Conus medullaris and cauda equina: Conus extends to the L1-2 level. Conus and cauda equina appear normal.   Paraspinal and other soft tissues: No acute paraspinal abnormality. Right renal cyst.   Disc levels:   Disc spaces: Degenerative disease with disc height loss at L3-4, L4-5 and L5-S1.   T12-L1: Minimal broad-based disc bulge. No foraminal or central canal stenosis.   L1-L2: No significant disc bulge. No neural foraminal stenosis. No central canal stenosis.   L2-L3: Minimal broad-based disc bulge. Mild bilateral facet arthropathy. No foraminal or central canal stenosis.   L3-L4: Broad-based disc bulge with a broad left paracentral disc protrusion with mass effect on the left intraspinal L4 nerve root. Moderate bilateral facet arthropathy. Moderate-severe spinal stenosis. Left subarticular recess stenosis. Moderate left foraminal stenosis. Mild right foraminal stenosis.   L4-L5: Broad-based disc bulge with a broad central disc protrusion. Severe bilateral facet arthropathy with ligamentum flavum infolding. Severe spinal stenosis. Mild right and severe left foraminal stenosis.   L5-S1: Mild broad-based disc bulge with a broad shallow right paracentral disc protrusion with mass  effect on the right intraspinal S1 nerve root. Mild bilateral foraminal stenosis. Mild bilateral facet arthropathy. No spinal stenosis.   IMPRESSION: 1. Lumbar spine spondylosis as described above most severe at L3-4, L4-5 and L5-S1. 2. No acute osseous injury of the lumbar spine.     Electronically Signed   By: Kathreen Devoid M.D.   On: 11/23/2020 08:11     Objective:  VS:  HT:     WT:    BMI:      BP:(!) 130/53   HR:80bpm   TEMP: ( )   RESP:  Physical Exam Vitals and nursing note reviewed.  Constitutional:      General: She is not in acute distress.    Appearance: Normal appearance. She is not ill-appearing.  HENT:     Head: Normocephalic and atraumatic.     Right Ear: External ear normal.     Left Ear: External ear normal.  Eyes:     Extraocular Movements: Extraocular movements intact.  Cardiovascular:     Rate and Rhythm: Normal rate.     Pulses: Normal pulses.  Pulmonary:     Effort: Pulmonary effort is normal. No respiratory distress.  Abdominal:     General: There is no distension.     Palpations: Abdomen is soft.  Musculoskeletal:        General: Tenderness present.     Cervical back: Neck supple.  Right lower leg: No edema.     Left lower leg: No edema.     Comments: Patient has good distal strength with no pain over the greater trochanters.  No clonus or focal weakness.  Skin:    Findings: No erythema, lesion or rash.  Neurological:     General: No focal deficit present.     Mental Status: She is alert and oriented to person, place, and time.     Sensory: No sensory deficit.     Motor: No weakness or abnormal muscle tone.     Coordination: Coordination normal.  Psychiatric:        Mood and Affect: Mood normal.        Behavior: Behavior normal.     Imaging: No results found.

## 2021-04-15 NOTE — Procedures (Signed)
Lumbosacral Transforaminal Epidural Steroid Injection - Sub-Pedicular Approach with Fluoroscopic Guidance  Patient: Shelby Walker      Date of Birth: May 07, 1933 MRN: [id number] PCP: Reynold Bowen, MD      Visit Date: 04/06/2021   Universal Protocol:    Date/Time: 04/06/2021  Consent Given By: the patient  Position: PRONE  Additional Comments: Vital signs were monitored before and after the procedure. Patient was prepped and draped in the usual sterile fashion. The correct patient, procedure, and site was verified.   Injection Procedure Details:   Procedure diagnoses: Lumbar radiculopathy [M54.16]    Meds Administered:  Meds ordered this encounter  Medications   methylPREDNISolone acetate (DEPO-MEDROL) injection 80 mg    Laterality: Left  Location/Site: L3  Needle:5.0 in., 22 ga.  Short bevel or Quincke spinal needle  Needle Placement: Transforaminal  Findings:    -Comments: Excellent flow of contrast along the nerve, nerve root and into the epidural space.  Procedure Details: After squaring off the end-plates to get a true AP view, the C-arm was positioned so that an oblique view of the foramen as noted above was visualized. The target area is just inferior to the "nose of the scotty dog" or sub pedicular. The soft tissues overlying this structure were infiltrated with 2-3 ml. of 1% Lidocaine without Epinephrine.  The spinal needle was inserted toward the target using a "trajectory" view along the fluoroscope beam.  Under AP and lateral visualization, the needle was advanced so it did not puncture dura and was located close the 6 O'Clock position of the pedical in AP tracterory. Biplanar projections were used to confirm position. Aspiration was confirmed to be negative for CSF and/or blood. A 1-2 ml. volume of Isovue-250 was injected and flow of contrast was noted at each level. Radiographs were obtained for documentation purposes.   After attaining the desired flow of  contrast documented above, a 0.5 to 1.0 ml test dose of 0.25% Marcaine was injected into each respective transforaminal space.  The patient was observed for 90 seconds post injection.  After no sensory deficits were reported, and normal lower extremity motor function was noted,   the above injectate was administered so that equal amounts of the injectate were placed at each foramen (level) into the transforaminal epidural space.   Additional Comments:  The patient tolerated the procedure well Dressing: 2 x 2 sterile gauze and Band-Aid    Post-procedure details: Patient was observed during the procedure. Post-procedure instructions were reviewed.  Patient left the clinic in stable condition.

## 2021-04-17 ENCOUNTER — Telehealth: Payer: Self-pay

## 2021-04-17 NOTE — Telephone Encounter (Signed)
Patients daughter called into the office and wanted to know where the CT scan will be scheduled at she stated that she can go ahead and call.   Please advise

## 2021-04-17 NOTE — Telephone Encounter (Signed)
Called pt daughter back and informed her the order has went to [facility] imaging

## 2021-04-18 ENCOUNTER — Telehealth: Payer: Self-pay | Admitting: Orthopaedic Surgery

## 2021-04-18 DIAGNOSIS — M25462 Effusion, left knee: Secondary | ICD-10-CM | POA: Diagnosis not present

## 2021-04-18 DIAGNOSIS — M1712 Unilateral primary osteoarthritis, left knee: Secondary | ICD-10-CM | POA: Diagnosis not present

## 2021-04-18 NOTE — Telephone Encounter (Signed)
Spoke with [name] concerning the CT scan her mother had today at [facility]. [name] asked if there is a sooner appointment that Dr. Ninfa Linden or Artis Delay can see her mother? [name] said her mother is in a great deal of pain. The number  to contact [name] is [phone]

## 2021-04-19 DIAGNOSIS — E11628 Type 2 diabetes mellitus with other skin complications: Secondary | ICD-10-CM | POA: Diagnosis not present

## 2021-04-19 DIAGNOSIS — E1122 Type 2 diabetes mellitus with diabetic chronic kidney disease: Secondary | ICD-10-CM | POA: Diagnosis not present

## 2021-04-19 DIAGNOSIS — Z4781 Encounter for orthopedic aftercare following surgical amputation: Secondary | ICD-10-CM | POA: Diagnosis not present

## 2021-04-19 DIAGNOSIS — E1151 Type 2 diabetes mellitus with diabetic peripheral angiopathy without gangrene: Secondary | ICD-10-CM | POA: Diagnosis not present

## 2021-04-19 DIAGNOSIS — M069 Rheumatoid arthritis, unspecified: Secondary | ICD-10-CM | POA: Diagnosis not present

## 2021-04-19 DIAGNOSIS — N189 Chronic kidney disease, unspecified: Secondary | ICD-10-CM | POA: Diagnosis not present

## 2021-04-19 DIAGNOSIS — I129 Hypertensive chronic kidney disease with stage 1 through stage 4 chronic kidney disease, or unspecified chronic kidney disease: Secondary | ICD-10-CM | POA: Diagnosis not present

## 2021-04-20 ENCOUNTER — Telehealth: Payer: Self-pay | Admitting: Orthopaedic Surgery

## 2021-04-20 DIAGNOSIS — E1151 Type 2 diabetes mellitus with diabetic peripheral angiopathy without gangrene: Secondary | ICD-10-CM | POA: Diagnosis not present

## 2021-04-20 DIAGNOSIS — E11628 Type 2 diabetes mellitus with other skin complications: Secondary | ICD-10-CM | POA: Diagnosis not present

## 2021-04-20 DIAGNOSIS — E1122 Type 2 diabetes mellitus with diabetic chronic kidney disease: Secondary | ICD-10-CM | POA: Diagnosis not present

## 2021-04-20 DIAGNOSIS — I129 Hypertensive chronic kidney disease with stage 1 through stage 4 chronic kidney disease, or unspecified chronic kidney disease: Secondary | ICD-10-CM | POA: Diagnosis not present

## 2021-04-20 DIAGNOSIS — N189 Chronic kidney disease, unspecified: Secondary | ICD-10-CM | POA: Diagnosis not present

## 2021-04-20 DIAGNOSIS — Z4781 Encounter for orthopedic aftercare following surgical amputation: Secondary | ICD-10-CM | POA: Diagnosis not present

## 2021-04-20 NOTE — Telephone Encounter (Signed)
Spoke with [name] concerning the CT scan her mother had today at [place]. [name] asked if there is a sooner appointment that Dr. Ninfa Linden or Artis Delay can see her mother? [name] said her mother is in a great deal of pain. The number  to contact [name] is [phone]

## 2021-04-20 NOTE — Telephone Encounter (Signed)
Spoke with [name] she advised her mother can come 04/24/2021 at 9:45 am.

## 2021-04-20 NOTE — Telephone Encounter (Signed)
Scheduled

## 2021-04-24 ENCOUNTER — Ambulatory Visit (INDEPENDENT_AMBULATORY_CARE_PROVIDER_SITE_OTHER): Payer: Medicare Other | Admitting: Orthopaedic Surgery

## 2021-04-24 ENCOUNTER — Encounter: Payer: Self-pay | Admitting: Orthopaedic Surgery

## 2021-04-24 ENCOUNTER — Other Ambulatory Visit: Payer: Self-pay

## 2021-04-24 DIAGNOSIS — S8002XA Contusion of left knee, initial encounter: Secondary | ICD-10-CM | POA: Diagnosis not present

## 2021-04-24 NOTE — Progress Notes (Signed)
The patient is a 88 year old female who had a mechanical fall 2 and half weeks ago onto her left knee.  She is on Eliquis and she did develop significant soft tissue swelling and a prepatellar bursitis but it was more of a hematoma.  She did have a recent CT scan of the left knee that ruled out any type of fracture.  She is ambulating using a walker and does report left knee pain.  On exam she does have a fluid collection around the prepatellar area but there is no redness or evidence of infection.  This seems to be consistent more with a hematoma and I did review the CT scan and saw the images.  There was no fracture of her left knee and there is some moderate arthritic changes.  There was abundant fluid over the prepatellar area.  I did try to aspirate fluid from this area and it is consistent with a hematoma.  I have recommended intermittent Voltaren gel as well as trying heat 2-3 times a day with a cloth over her knee which can help this breakdown over time.  Her and her daughter understand that this will take time for this to resolve.  Have also recommended compressive garments.  All questions and concerns were answered and addressed.  Follow-up is as needed.

## 2021-04-26 DIAGNOSIS — I129 Hypertensive chronic kidney disease with stage 1 through stage 4 chronic kidney disease, or unspecified chronic kidney disease: Secondary | ICD-10-CM | POA: Diagnosis not present

## 2021-04-26 DIAGNOSIS — E1151 Type 2 diabetes mellitus with diabetic peripheral angiopathy without gangrene: Secondary | ICD-10-CM | POA: Diagnosis not present

## 2021-04-26 DIAGNOSIS — Z4781 Encounter for orthopedic aftercare following surgical amputation: Secondary | ICD-10-CM | POA: Diagnosis not present

## 2021-04-26 DIAGNOSIS — E11628 Type 2 diabetes mellitus with other skin complications: Secondary | ICD-10-CM | POA: Diagnosis not present

## 2021-04-26 DIAGNOSIS — E1122 Type 2 diabetes mellitus with diabetic chronic kidney disease: Secondary | ICD-10-CM | POA: Diagnosis not present

## 2021-04-26 DIAGNOSIS — N189 Chronic kidney disease, unspecified: Secondary | ICD-10-CM | POA: Diagnosis not present

## 2021-04-27 DIAGNOSIS — N189 Chronic kidney disease, unspecified: Secondary | ICD-10-CM | POA: Diagnosis not present

## 2021-04-27 DIAGNOSIS — E11628 Type 2 diabetes mellitus with other skin complications: Secondary | ICD-10-CM | POA: Diagnosis not present

## 2021-04-27 DIAGNOSIS — E1151 Type 2 diabetes mellitus with diabetic peripheral angiopathy without gangrene: Secondary | ICD-10-CM | POA: Diagnosis not present

## 2021-04-27 DIAGNOSIS — I129 Hypertensive chronic kidney disease with stage 1 through stage 4 chronic kidney disease, or unspecified chronic kidney disease: Secondary | ICD-10-CM | POA: Diagnosis not present

## 2021-04-27 DIAGNOSIS — E1122 Type 2 diabetes mellitus with diabetic chronic kidney disease: Secondary | ICD-10-CM | POA: Diagnosis not present

## 2021-04-27 DIAGNOSIS — Z4781 Encounter for orthopedic aftercare following surgical amputation: Secondary | ICD-10-CM | POA: Diagnosis not present

## 2021-04-28 DIAGNOSIS — E1151 Type 2 diabetes mellitus with diabetic peripheral angiopathy without gangrene: Secondary | ICD-10-CM | POA: Diagnosis not present

## 2021-04-28 DIAGNOSIS — I129 Hypertensive chronic kidney disease with stage 1 through stage 4 chronic kidney disease, or unspecified chronic kidney disease: Secondary | ICD-10-CM | POA: Diagnosis not present

## 2021-04-28 DIAGNOSIS — N189 Chronic kidney disease, unspecified: Secondary | ICD-10-CM | POA: Diagnosis not present

## 2021-04-28 DIAGNOSIS — E11628 Type 2 diabetes mellitus with other skin complications: Secondary | ICD-10-CM | POA: Diagnosis not present

## 2021-04-28 DIAGNOSIS — E1122 Type 2 diabetes mellitus with diabetic chronic kidney disease: Secondary | ICD-10-CM | POA: Diagnosis not present

## 2021-04-28 DIAGNOSIS — Z4781 Encounter for orthopedic aftercare following surgical amputation: Secondary | ICD-10-CM | POA: Diagnosis not present

## 2021-05-03 DIAGNOSIS — E11319 Type 2 diabetes mellitus with unspecified diabetic retinopathy without macular edema: Secondary | ICD-10-CM | POA: Diagnosis not present

## 2021-05-03 DIAGNOSIS — M069 Rheumatoid arthritis, unspecified: Secondary | ICD-10-CM | POA: Diagnosis not present

## 2021-05-04 DIAGNOSIS — N189 Chronic kidney disease, unspecified: Secondary | ICD-10-CM | POA: Diagnosis not present

## 2021-05-04 DIAGNOSIS — E1122 Type 2 diabetes mellitus with diabetic chronic kidney disease: Secondary | ICD-10-CM | POA: Diagnosis not present

## 2021-05-04 DIAGNOSIS — E1151 Type 2 diabetes mellitus with diabetic peripheral angiopathy without gangrene: Secondary | ICD-10-CM | POA: Diagnosis not present

## 2021-05-04 DIAGNOSIS — I129 Hypertensive chronic kidney disease with stage 1 through stage 4 chronic kidney disease, or unspecified chronic kidney disease: Secondary | ICD-10-CM | POA: Diagnosis not present

## 2021-05-04 DIAGNOSIS — Z89431 Acquired absence of right foot: Secondary | ICD-10-CM | POA: Diagnosis not present

## 2021-05-04 DIAGNOSIS — Z4781 Encounter for orthopedic aftercare following surgical amputation: Secondary | ICD-10-CM | POA: Diagnosis not present

## 2021-05-04 DIAGNOSIS — E11628 Type 2 diabetes mellitus with other skin complications: Secondary | ICD-10-CM | POA: Diagnosis not present

## 2021-05-05 DIAGNOSIS — E11628 Type 2 diabetes mellitus with other skin complications: Secondary | ICD-10-CM | POA: Diagnosis not present

## 2021-05-05 DIAGNOSIS — E1151 Type 2 diabetes mellitus with diabetic peripheral angiopathy without gangrene: Secondary | ICD-10-CM | POA: Diagnosis not present

## 2021-05-05 DIAGNOSIS — E1122 Type 2 diabetes mellitus with diabetic chronic kidney disease: Secondary | ICD-10-CM | POA: Diagnosis not present

## 2021-05-05 DIAGNOSIS — N189 Chronic kidney disease, unspecified: Secondary | ICD-10-CM | POA: Diagnosis not present

## 2021-05-05 DIAGNOSIS — I129 Hypertensive chronic kidney disease with stage 1 through stage 4 chronic kidney disease, or unspecified chronic kidney disease: Secondary | ICD-10-CM | POA: Diagnosis not present

## 2021-05-05 DIAGNOSIS — Z4781 Encounter for orthopedic aftercare following surgical amputation: Secondary | ICD-10-CM | POA: Diagnosis not present

## 2021-05-08 ENCOUNTER — Telehealth: Payer: Self-pay | Admitting: Physical Medicine and Rehabilitation

## 2021-05-08 DIAGNOSIS — Z4781 Encounter for orthopedic aftercare following surgical amputation: Secondary | ICD-10-CM | POA: Diagnosis not present

## 2021-05-08 DIAGNOSIS — E11628 Type 2 diabetes mellitus with other skin complications: Secondary | ICD-10-CM | POA: Diagnosis not present

## 2021-05-08 DIAGNOSIS — E1151 Type 2 diabetes mellitus with diabetic peripheral angiopathy without gangrene: Secondary | ICD-10-CM | POA: Diagnosis not present

## 2021-05-08 DIAGNOSIS — I129 Hypertensive chronic kidney disease with stage 1 through stage 4 chronic kidney disease, or unspecified chronic kidney disease: Secondary | ICD-10-CM | POA: Diagnosis not present

## 2021-05-08 DIAGNOSIS — N189 Chronic kidney disease, unspecified: Secondary | ICD-10-CM | POA: Diagnosis not present

## 2021-05-08 DIAGNOSIS — E1122 Type 2 diabetes mellitus with diabetic chronic kidney disease: Secondary | ICD-10-CM | POA: Diagnosis not present

## 2021-05-08 NOTE — Telephone Encounter (Signed)
Patient's daughter [name] called advised patient is still having pain in her left leg and wanted to know if she can have another injection? The number to contact Jeanell Sparrow is [phone] ?

## 2021-05-09 ENCOUNTER — Ambulatory Visit: Payer: Medicare Other | Admitting: Cardiovascular Disease

## 2021-05-10 DIAGNOSIS — Z8631 Personal history of diabetic foot ulcer: Secondary | ICD-10-CM | POA: Diagnosis not present

## 2021-05-10 DIAGNOSIS — Z4781 Encounter for orthopedic aftercare following surgical amputation: Secondary | ICD-10-CM | POA: Diagnosis not present

## 2021-05-10 DIAGNOSIS — I051 Rheumatic mitral insufficiency: Secondary | ICD-10-CM | POA: Diagnosis not present

## 2021-05-10 DIAGNOSIS — I4891 Unspecified atrial fibrillation: Secondary | ICD-10-CM | POA: Diagnosis not present

## 2021-05-10 DIAGNOSIS — Z7952 Long term (current) use of systemic steroids: Secondary | ICD-10-CM | POA: Diagnosis not present

## 2021-05-10 DIAGNOSIS — Z7901 Long term (current) use of anticoagulants: Secondary | ICD-10-CM | POA: Diagnosis not present

## 2021-05-10 DIAGNOSIS — K219 Gastro-esophageal reflux disease without esophagitis: Secondary | ICD-10-CM | POA: Diagnosis not present

## 2021-05-10 DIAGNOSIS — Z89431 Acquired absence of right foot: Secondary | ICD-10-CM | POA: Diagnosis not present

## 2021-05-10 DIAGNOSIS — I6529 Occlusion and stenosis of unspecified carotid artery: Secondary | ICD-10-CM | POA: Diagnosis not present

## 2021-05-10 DIAGNOSIS — Z791 Long term (current) use of non-steroidal anti-inflammatories (NSAID): Secondary | ICD-10-CM | POA: Diagnosis not present

## 2021-05-10 DIAGNOSIS — E46 Unspecified protein-calorie malnutrition: Secondary | ICD-10-CM | POA: Diagnosis not present

## 2021-05-10 DIAGNOSIS — E1151 Type 2 diabetes mellitus with diabetic peripheral angiopathy without gangrene: Secondary | ICD-10-CM | POA: Diagnosis not present

## 2021-05-10 DIAGNOSIS — M810 Age-related osteoporosis without current pathological fracture: Secondary | ICD-10-CM | POA: Diagnosis not present

## 2021-05-10 DIAGNOSIS — E1142 Type 2 diabetes mellitus with diabetic polyneuropathy: Secondary | ICD-10-CM | POA: Diagnosis not present

## 2021-05-10 DIAGNOSIS — K5790 Diverticulosis of intestine, part unspecified, without perforation or abscess without bleeding: Secondary | ICD-10-CM | POA: Diagnosis not present

## 2021-05-10 DIAGNOSIS — Z79891 Long term (current) use of opiate analgesic: Secondary | ICD-10-CM | POA: Diagnosis not present

## 2021-05-10 DIAGNOSIS — N189 Chronic kidney disease, unspecified: Secondary | ICD-10-CM | POA: Diagnosis not present

## 2021-05-10 DIAGNOSIS — Z794 Long term (current) use of insulin: Secondary | ICD-10-CM | POA: Diagnosis not present

## 2021-05-10 DIAGNOSIS — M069 Rheumatoid arthritis, unspecified: Secondary | ICD-10-CM | POA: Diagnosis not present

## 2021-05-10 DIAGNOSIS — I129 Hypertensive chronic kidney disease with stage 1 through stage 4 chronic kidney disease, or unspecified chronic kidney disease: Secondary | ICD-10-CM | POA: Diagnosis not present

## 2021-05-10 DIAGNOSIS — E785 Hyperlipidemia, unspecified: Secondary | ICD-10-CM | POA: Diagnosis not present

## 2021-05-10 DIAGNOSIS — Z9181 History of falling: Secondary | ICD-10-CM | POA: Diagnosis not present

## 2021-05-10 DIAGNOSIS — E1122 Type 2 diabetes mellitus with diabetic chronic kidney disease: Secondary | ICD-10-CM | POA: Diagnosis not present

## 2021-05-10 DIAGNOSIS — E11628 Type 2 diabetes mellitus with other skin complications: Secondary | ICD-10-CM | POA: Diagnosis not present

## 2021-05-17 DIAGNOSIS — M069 Rheumatoid arthritis, unspecified: Secondary | ICD-10-CM | POA: Diagnosis not present

## 2021-05-18 ENCOUNTER — Other Ambulatory Visit: Payer: Self-pay

## 2021-05-18 ENCOUNTER — Encounter: Payer: Self-pay | Admitting: Physical Medicine and Rehabilitation

## 2021-05-18 ENCOUNTER — Ambulatory Visit (INDEPENDENT_AMBULATORY_CARE_PROVIDER_SITE_OTHER): Payer: Medicare Other | Admitting: Physical Medicine and Rehabilitation

## 2021-05-18 VITALS — BP 125/77 | HR 94

## 2021-05-18 DIAGNOSIS — M4316 Spondylolisthesis, lumbar region: Secondary | ICD-10-CM

## 2021-05-18 DIAGNOSIS — M5416 Radiculopathy, lumbar region: Secondary | ICD-10-CM | POA: Diagnosis not present

## 2021-05-18 DIAGNOSIS — M47816 Spondylosis without myelopathy or radiculopathy, lumbar region: Secondary | ICD-10-CM

## 2021-05-18 DIAGNOSIS — R296 Repeated falls: Secondary | ICD-10-CM | POA: Diagnosis not present

## 2021-05-18 DIAGNOSIS — M4726 Other spondylosis with radiculopathy, lumbar region: Secondary | ICD-10-CM

## 2021-05-18 DIAGNOSIS — M48062 Spinal stenosis, lumbar region with neurogenic claudication: Secondary | ICD-10-CM | POA: Diagnosis not present

## 2021-05-18 NOTE — Progress Notes (Signed)
Pt state lower back pain that travels to her left hip and down to her knee and foot. Pt state walking and standing makes the pain worse. Pt state resting and taking pain meds and uses heat help ease her pain. Pt has hx of inj 04/06/21 it helped. Pt state she fell on 04/07/21 and was f/u [facility]. ? ?Numeric Pain Rating Scale and Functional Assessment ?Average Pain 10 ?Pain Right Now 6 ?My pain is constant, sharp, burning, stabbing, and aching ?Pain is worse with: walking, standing, and some activites ?Pain improves with: heat/ice, medication, and injections ? ? ?In the last MONTH (on 0-10 scale) has pain interfered with the following? ? ?1. General activity like being  able to carry out your everyday physical activities such as walking, climbing stairs, carrying groceries, or moving a chair?  ?Rating(6) ? ?2. Relation with others like being able to carry out your usual social activities and roles such as  activities at home, at work and in your community. ?Rating(7) ? ?3. Enjoyment of life such that you have  been bothered by emotional problems such as feeling anxious, depressed or irritable?  ?Rating(8) ? ?

## 2021-05-18 NOTE — Progress Notes (Signed)
? ?Shelby Walker - 88 y.o. female MRN [id number]  Date of birth: Oct 06, 1933 ? ?Office Visit Note: ?Visit Date: 05/18/2021 ?PCP: Reynold Bowen, MD ?Referred by: Reynold Bowen, MD ? ?Subjective: ?Chief Complaint  ?Patient presents with  ? Lower Back - Pain  ? Left Hip - Pain  ? Left Knee - Pain  ? Left Foot - Pain  ? ?HPI: Shelby Walker is a 88 y.o. female who comes in today for evaluation of chronic, worsening and severe left-sided lower back pain radiating to lateral leg and down to ankle.  Patient reports pain has been ongoing for several months and is exacerbated by prolonged standing and walking.  She describes pain as a constant sore and throbbing sensation, currently rates as 8 out of 10.  Patient reports some relief of of pain with rest, heating pad and OTC medications. Patient's recent lumbar MRI exhibits multi-level facet arthropathy, stairstep listhesis grade 1 of L3 on L4, L4 on L5 and L5 on S1, moderate-severe spinal stenosis and left paracentral disc protrusion at L3-L4 and severe spinal stenosis at L4-L5.  Patient had left L3 transforaminal epidural steroid injection on 04/06/2021 in our office and sustained mechanical fall at home the following day injuring her left knee.  Patient was evaluated by Dr. Jean Rosenthal whom sent her for a CT scan of her left knee that did exhibit prepatellar bursitis/hematoma, no fractures noted.  Patient reports continued swelling to left knee and left lower extremity following fall. Patient reports history of chronic left knee pain and osteoarthritis that is being managed by Dr. Ninfa Linden.  Patient states her left lower back pain has significantly increased since the fall and is now having difficulty completing daily tasks due to severe discomfort.  Patient states she is not able to stand for prolonged periods of time or walk for long distances due to increased pain.  Patient is currently using rolling walker to assist with ambulation and prevent falls. ? ?Review of  Systems  ?Musculoskeletal:  Positive for back pain and joint pain.  ?Neurological:  Negative for tingling, sensory change, focal weakness and weakness.  ?All other systems reviewed and are negative. Otherwise per HPI. ? ?Assessment & Plan: ?Visit Diagnoses:  ?  ICD-10-CM   ?1. Lumbar radiculopathy  M54.16 Ambulatory referral to Physical Medicine Rehab  ?  ?2. Spinal stenosis of lumbar region with neurogenic claudication  M48.062 Ambulatory referral to Physical Medicine Rehab  ?  ?3. Other spondylosis with radiculopathy, lumbar region  M47.26 Ambulatory referral to Physical Medicine Rehab  ?  ?4. Spondylolisthesis of lumbar region  M43.16 Ambulatory referral to Physical Medicine Rehab  ?  ?5. Facet arthropathy, lumbar  M47.816 Ambulatory referral to Physical Medicine Rehab  ?  ?6. Frequent falls  R29.6   ?  ?   ?Plan: Findings:  ?Chronic, worsening and severe left sided lower back pain radiating to left lateral leg and down to ankle. Patient continues to have excruciating and debilitating pain despite good conservative therapies such as rest, use of heating pad and medications. Pain worsened after mechanical fall several weeks ago. Patient did sustain mechanical fall the day after her previous injection and it is difficult to access therapeutic effects of injection due to injury from fall. Patients clinical presentation and exam are consistent with neurogenic claudication as a result of spinal canal stenosis. Patient does have severe spinal canal stenosis at L3-L4 and L4-L5. We spoke with patient and daughter during last office visit about possible surgical consultation, however I  am not confident she is a good candidate based on her recent cardiac issues. We feel the next step is to repeat left L3 transforaminal epidural steroid injection under fluoroscopic guidance. Patient encouraged to remain active as tolerated and to continue using rolling walker at home. Patient instructed to follow up with [place] 3 weeks after  epidural steroid injection for re-evaluation and to discuss treatment plan. No red flag symptoms noted upon exam today.   ? ?Meds & Orders: No orders of the defined types were placed in this encounter. ?  ?Orders Placed This Encounter  ?Procedures  ? Ambulatory referral to Physical Medicine Rehab  ?  ?Follow-up: Return for Left L3 transforaminal epidural steroid injection.  ? ?Procedures: ?No procedures performed  ?   ? ?Clinical History: ?EXAM: ?MRI LUMBAR SPINE WITHOUT CONTRAST ?  ?TECHNIQUE: ?Multiplanar, multisequence MR imaging of the lumbar spine was ?performed. No intravenous contrast was administered. ?  ?COMPARISON:  RADIOGRAPHY OF THE LUMBAR DATED 11/16/2020. RADIOGRAPHY ?OF THE LUMBAR DATED 08/07/2018. ?  ?FINDINGS: ?Segmentation:  Standard. ?  ?Alignment: Grade 1 anterolisthesis of L3 on L4, L4 on L5 and L5-S1 ?secondary to facet disease. ?  ?Vertebrae: No acute fracture, evidence of discitis, or aggressive ?bone lesion. ?  ?Conus medullaris and cauda equina: Conus extends to the L1-2 level. ?Conus and cauda equina appear normal. ?  ?Paraspinal and other soft tissues: No acute paraspinal abnormality. ?Right renal cyst. ?  ?Disc levels: ?  ?Disc spaces: Degenerative disease with disc height loss at L3-4, ?L4-5 and L5-S1. ?  ?T12-L1: Minimal broad-based disc bulge. No foraminal or central ?canal stenosis. ?  ?L1-L2: No significant disc bulge. No neural foraminal stenosis. No ?central canal stenosis. ?  ?L2-L3: Minimal broad-based disc bulge. Mild bilateral facet ?arthropathy. No foraminal or central canal stenosis. ?  ?L3-L4: Broad-based disc bulge with a broad left paracentral disc ?protrusion with mass effect on the left intraspinal L4 nerve root. ?Moderate bilateral facet arthropathy. Moderate-severe spinal ?stenosis. Left subarticular recess stenosis. Moderate left foraminal ?stenosis. Mild right foraminal stenosis. ?  ?L4-L5: Broad-based disc bulge with a broad central disc protrusion. ?Severe bilateral  facet arthropathy with ligamentum flavum infolding. ?Severe spinal stenosis. Mild right and severe left foraminal ?stenosis. ?  ?L5-S1: Mild broad-based disc bulge with a broad shallow right ?paracentral disc protrusion with mass effect on the right ?intraspinal S1 nerve root. Mild bilateral foraminal stenosis. Mild ?bilateral facet arthropathy. No spinal stenosis. ?  ?IMPRESSION: ?1. Lumbar spine spondylosis as described above most severe at L3-4, ?L4-5 and L5-S1. ?2. No acute osseous injury of the lumbar spine. ?  ?  ?Electronically Signed ?  By: Kathreen Devoid M.D. ?  On: 11/23/2020 08:11  ? ?She reports that she has never smoked. She has never used smokeless tobacco.  ?Recent Labs  ?  01/18/21 ?[id number]  ?HGBA1C 7.9*  ? ? ?Objective:  VS:  HT:    WT:   BMI:     BP:125/77  HR:94bpm  TEMP: ( )  RESP:  ?Physical Exam ?Vitals and nursing note reviewed.  ?Constitutional:   ?   Appearance: Normal appearance.  ?HENT:  ?   Head: Normocephalic and atraumatic.  ?   Right Ear: External ear normal.  ?   Left Ear: External ear normal.  ?   Nose: Nose normal.  ?   Mouth/Throat:  ?   Mouth: Mucous membranes are moist.  ?Eyes:  ?   Extraocular Movements: Extraocular movements intact.  ?Cardiovascular:  ?   Rate  and Rhythm: Normal rate.  ?   Pulses: Normal pulses.  ?Pulmonary:  ?   Effort: Pulmonary effort is normal.  ?Abdominal:  ?   General: Abdomen is flat. There is no distension.  ?Musculoskeletal:     ?   General: Tenderness present.  ?   Cervical back: Normal range of motion.  ?   Comments: Pt is slow to rise from seated position to standing. Good lumbar range of motion. Strong distal strength without clonus, no pain upon palpation of greater trochanters. Sensation intact bilaterally. Ambulates with rolling walker, gait unsteady.  ?Skin: ?   General: Skin is warm and dry.  ?   Capillary Refill: Capillary refill takes less than 2 seconds.  ?Neurological:  ?   Mental Status: She is alert and oriented to person, place, and  time.  ?   Gait: Gait abnormal.  ?Psychiatric:     ?   Mood and Affect: Mood normal.     ?   Behavior: Behavior normal.  ?  ?Ortho Exam ? ?Imaging: ?No results found. ? ?Past Medical/Family/Surgical/Social History

## 2021-05-19 ENCOUNTER — Ambulatory Visit (INDEPENDENT_AMBULATORY_CARE_PROVIDER_SITE_OTHER): Payer: Medicare Other | Admitting: Podiatry

## 2021-05-19 ENCOUNTER — Encounter: Payer: Self-pay | Admitting: Podiatry

## 2021-05-19 DIAGNOSIS — B351 Tinea unguium: Secondary | ICD-10-CM | POA: Diagnosis not present

## 2021-05-19 DIAGNOSIS — M79675 Pain in left toe(s): Secondary | ICD-10-CM | POA: Diagnosis not present

## 2021-05-19 NOTE — Progress Notes (Signed)
? ?  SUBJECTIVE ?Patient with a history of diabetes mellitus presents to office today complaining of elongated, thickened nails that cause pain while ambulating in shoes.  Patient is unable to trim their own nails.  Unfortunately the patient went through transmetatarsal amputation of the right foot towards the end of last year with Dr. Doran Durand, [facility].  Patient is here for further evaluation and treatment. ? ? ?Past Medical History:  ?Diagnosis Date  ? Bruit   ? Carotid Doppler showed no significant abnormality     9per patient)  ? C. difficile colitis 07/2018  ? with severe sepsis  ? Chest pain, unspecified   ? Nuclear, May, 2008, no scar or ischemia  ? Diabetes mellitus   ? Diverticulosis   ? Dyslipidemia   ? Ejection fraction   ? EF 55-60%, echo, February, 2011 // Echocardiogram 8/21: EF 60-65, no RWMA, Gr 1 DD, GLS -14%, normal RVSF, mild LAE, trivial MR, mild MS (mean 4 mmHg), RVSP 23.4   ? GERD (gastroesophageal reflux disease)   ? Mitral regurgitation April 21, 2009  ? mild,  echo, February, 2011  ? Osteoporosis   ? Palpitations   ? possible very brief atrial fibrillation on monitor and possible reentrant tachycardia  ? Psoriasis   ? Rheumatoid arthritis ([place])   ? ? ?OBJECTIVE ?General Patient is awake, alert, and oriented x 3 and in no acute distress. ?Derm Skin is dry and supple bilateral. Negative open lesions or macerations. Remaining integument unremarkable. Nails are tender, long, thickened and dystrophic with subungual debris, consistent with onychomycosis, 1-5 left.  No signs of infection noted. ?Vasc  DP and PT pedal pulses palpable left. Temperature gradient within normal limits.  ?Neuro Epicritic and protective threshold sensation diminished bilaterally.  ?Musculoskeletal Exam history of TMA right foot ? ?ASSESSMENT ?1. Diabetes Mellitus w/ peripheral neuropathy ?2.  Pain due to onychomycosis of toenails left ?3. PSxHx TMA RLE. Dr. Doran Durand. 01/2021 ? ?PLAN OF CARE ?1. Patient evaluated  today. ?2.  Continue diabetic shoes with insoles with a toe filler to the right foot ?3. Mechanical debridement of nails 1-5 left performed using a nail nipper. Filed with dremel without incident.  ?4. Return to clinic in 3 mos.  ? ? ? ?Edrick Kins, DPM ?[place] ? ?Dr. Edrick Kins, DPM  ?  ?[address]                                      ?[place], [place] [address]                ?Office [phone]  ?Fax [phone] ? ? ? ? ? ?

## 2021-05-22 DIAGNOSIS — E1151 Type 2 diabetes mellitus with diabetic peripheral angiopathy without gangrene: Secondary | ICD-10-CM | POA: Diagnosis not present

## 2021-05-22 DIAGNOSIS — E1122 Type 2 diabetes mellitus with diabetic chronic kidney disease: Secondary | ICD-10-CM | POA: Diagnosis not present

## 2021-05-22 DIAGNOSIS — E11628 Type 2 diabetes mellitus with other skin complications: Secondary | ICD-10-CM | POA: Diagnosis not present

## 2021-05-22 DIAGNOSIS — N189 Chronic kidney disease, unspecified: Secondary | ICD-10-CM | POA: Diagnosis not present

## 2021-05-22 DIAGNOSIS — Z4781 Encounter for orthopedic aftercare following surgical amputation: Secondary | ICD-10-CM | POA: Diagnosis not present

## 2021-05-22 DIAGNOSIS — I129 Hypertensive chronic kidney disease with stage 1 through stage 4 chronic kidney disease, or unspecified chronic kidney disease: Secondary | ICD-10-CM | POA: Diagnosis not present

## 2021-05-24 ENCOUNTER — Other Ambulatory Visit: Payer: Self-pay

## 2021-05-24 DIAGNOSIS — I480 Paroxysmal atrial fibrillation: Secondary | ICD-10-CM

## 2021-05-24 MED ORDER — APIXABAN 2.5 MG PO TABS: ORAL_TABLET | ORAL | 1 refills | Status: DC

## 2021-05-24 MED ORDER — APIXABAN 2.5 MG PO TABS: ORAL_TABLET | ORAL | 0 refills | Status: DC

## 2021-05-24 NOTE — Telephone Encounter (Signed)
Pt's daughter would like a call concerning this refill [phone]. Thanks  ?

## 2021-05-24 NOTE — Addendum Note (Signed)
Addended by: Leonidas Romberg on: 05/24/2021 01:02 PM ? ? Modules accepted: Orders ? ?

## 2021-05-24 NOTE — Telephone Encounter (Signed)
Prescription refill request for Eliquis received. ?Indication: Afib  ?Last office visit: 04/07/21 Caryl Comes)  ?Scr: 1.25 (02/04/21) ?Age: 88 ?Weight: 56.2kg ? ?Appropriate dose and refill sent to requested pharmacy.  ?

## 2021-05-25 DIAGNOSIS — E11628 Type 2 diabetes mellitus with other skin complications: Secondary | ICD-10-CM | POA: Diagnosis not present

## 2021-05-25 DIAGNOSIS — E1151 Type 2 diabetes mellitus with diabetic peripheral angiopathy without gangrene: Secondary | ICD-10-CM | POA: Diagnosis not present

## 2021-05-25 DIAGNOSIS — N189 Chronic kidney disease, unspecified: Secondary | ICD-10-CM | POA: Diagnosis not present

## 2021-05-25 DIAGNOSIS — E1122 Type 2 diabetes mellitus with diabetic chronic kidney disease: Secondary | ICD-10-CM | POA: Diagnosis not present

## 2021-05-25 DIAGNOSIS — I129 Hypertensive chronic kidney disease with stage 1 through stage 4 chronic kidney disease, or unspecified chronic kidney disease: Secondary | ICD-10-CM | POA: Diagnosis not present

## 2021-05-25 DIAGNOSIS — Z4781 Encounter for orthopedic aftercare following surgical amputation: Secondary | ICD-10-CM | POA: Diagnosis not present

## 2021-05-31 DIAGNOSIS — N189 Chronic kidney disease, unspecified: Secondary | ICD-10-CM | POA: Diagnosis not present

## 2021-05-31 DIAGNOSIS — E1122 Type 2 diabetes mellitus with diabetic chronic kidney disease: Secondary | ICD-10-CM | POA: Diagnosis not present

## 2021-05-31 DIAGNOSIS — Z4781 Encounter for orthopedic aftercare following surgical amputation: Secondary | ICD-10-CM | POA: Diagnosis not present

## 2021-05-31 DIAGNOSIS — I129 Hypertensive chronic kidney disease with stage 1 through stage 4 chronic kidney disease, or unspecified chronic kidney disease: Secondary | ICD-10-CM | POA: Diagnosis not present

## 2021-05-31 DIAGNOSIS — E11628 Type 2 diabetes mellitus with other skin complications: Secondary | ICD-10-CM | POA: Diagnosis not present

## 2021-05-31 DIAGNOSIS — E1151 Type 2 diabetes mellitus with diabetic peripheral angiopathy without gangrene: Secondary | ICD-10-CM | POA: Diagnosis not present

## 2021-06-01 ENCOUNTER — Ambulatory Visit: Payer: Self-pay

## 2021-06-01 ENCOUNTER — Ambulatory Visit (INDEPENDENT_AMBULATORY_CARE_PROVIDER_SITE_OTHER): Payer: Medicare Other | Admitting: Physical Medicine and Rehabilitation

## 2021-06-01 ENCOUNTER — Encounter: Payer: Self-pay | Admitting: Physical Medicine and Rehabilitation

## 2021-06-01 VITALS — BP 106/46 | HR 74

## 2021-06-01 DIAGNOSIS — M5416 Radiculopathy, lumbar region: Secondary | ICD-10-CM | POA: Diagnosis not present

## 2021-06-01 MED ORDER — METHYLPREDNISOLONE ACETATE 80 MG/ML IJ SUSP
80.0000 mg | Freq: Once | INTRAMUSCULAR | Status: AC
  Administered 2021-06-01: 80 mg

## 2021-06-01 NOTE — Progress Notes (Signed)
Pt state lower back pain that travels to her left hip and down to her knee and foot. Pt state walking and standing makes the pain worse. Pt state resting and taking pain meds and uses heat help ease her pain. ? ?Numeric Pain Rating Scale and Functional Assessment ?Average Pain 4 ? ? ?In the last MONTH (on 0-10 scale) has pain interfered with the following? ? ?1. General activity like being  able to carry out your everyday physical activities such as walking, climbing stairs, carrying groceries, or moving a chair?  ?Rating(8) ? ? ?+Driver, +BT, -Dye Allergies. ? ?

## 2021-06-01 NOTE — Patient Instructions (Signed)

## 2021-06-02 ENCOUNTER — Other Ambulatory Visit: Payer: Self-pay | Admitting: Cardiovascular Disease

## 2021-06-02 DIAGNOSIS — E782 Mixed hyperlipidemia: Secondary | ICD-10-CM

## 2021-06-02 DIAGNOSIS — Z4781 Encounter for orthopedic aftercare following surgical amputation: Secondary | ICD-10-CM | POA: Diagnosis not present

## 2021-06-02 DIAGNOSIS — I739 Peripheral vascular disease, unspecified: Secondary | ICD-10-CM

## 2021-06-02 DIAGNOSIS — E11628 Type 2 diabetes mellitus with other skin complications: Secondary | ICD-10-CM | POA: Diagnosis not present

## 2021-06-02 DIAGNOSIS — E1122 Type 2 diabetes mellitus with diabetic chronic kidney disease: Secondary | ICD-10-CM | POA: Diagnosis not present

## 2021-06-02 DIAGNOSIS — N189 Chronic kidney disease, unspecified: Secondary | ICD-10-CM | POA: Diagnosis not present

## 2021-06-02 DIAGNOSIS — I129 Hypertensive chronic kidney disease with stage 1 through stage 4 chronic kidney disease, or unspecified chronic kidney disease: Secondary | ICD-10-CM | POA: Diagnosis not present

## 2021-06-02 DIAGNOSIS — E1151 Type 2 diabetes mellitus with diabetic peripheral angiopathy without gangrene: Secondary | ICD-10-CM | POA: Diagnosis not present

## 2021-06-04 NOTE — Procedures (Signed)
Lumbosacral Transforaminal Epidural Steroid Injection - Sub-Pedicular Approach with Fluoroscopic Guidance ? ?Patient: Shelby Walker      ?Date of Birth: 1934/02/16 ?MRN: [id number] ?PCP: Reynold Bowen, MD      ?Visit Date: 06/01/2021 ?  ?Universal Protocol:    ?Date/Time: 06/01/2021 ? ?Consent Given By: the patient ? ?Position: PRONE ? ?Additional Comments: ?Vital signs were monitored before and after the procedure. ?Patient was prepped and draped in the usual sterile fashion. ?The correct patient, procedure, and site was verified. ? ? ?Injection Procedure Details:  ? ?Procedure diagnoses: Lumbar radiculopathy [M54.16]   ? ?Meds Administered:  ?Meds ordered this encounter  ?Medications  ? methylPREDNISolone acetate (DEPO-MEDROL) injection 80 mg  ? ? ?Laterality: Left ? ?Location/Site: L3 ? ?Needle:5.0 in., 22 ga.  Short bevel or Quincke spinal needle ? ?Needle Placement: Transforaminal ? ?Findings: ?  ? -Comments: Excellent flow of contrast along the nerve, nerve root and into the epidural space. ? ?Procedure Details: ?After squaring off the end-plates to get a true AP view, the C-arm was positioned so that an oblique view of the foramen as noted above was visualized. The target area is just inferior to the "nose of the scotty dog" or sub pedicular. The soft tissues overlying this structure were infiltrated with 2-3 ml. of 1% Lidocaine without Epinephrine. ? ?The spinal needle was inserted toward the target using a "trajectory" view along the fluoroscope beam.  Under AP and lateral visualization, the needle was advanced so it did not puncture dura and was located close the 6 O'Clock position of the pedical in AP tracterory. Biplanar projections were used to confirm position. Aspiration was confirmed to be negative for CSF and/or blood. A 1-2 ml. volume of Isovue-250 was injected and flow of contrast was noted at each level. Radiographs were obtained for documentation purposes.  ? ?After attaining the desired flow of  contrast documented above, a 0.5 to 1.0 ml test dose of 0.25% Marcaine was injected into each respective transforaminal space.  The patient was observed for 90 seconds post injection.  After no sensory deficits were reported, and normal lower extremity motor function was noted,   the above injectate was administered so that equal amounts of the injectate were placed at each foramen (level) into the transforaminal epidural space. ? ? ?Additional Comments:  ?The patient tolerated the procedure well ?Dressing: 2 x 2 sterile gauze and Band-Aid ?  ? ?Post-procedure details: ?Patient was observed during the procedure. ?Post-procedure instructions were reviewed. ? ?Patient left the clinic in stable condition. ? ?

## 2021-06-04 NOTE — Progress Notes (Signed)
? ?Shelby Walker - 88 y.o. female MRN [id number]  Date of birth: 1933-08-20 ? ?Office Visit Note: ?Visit Date: 06/01/2021 ?PCP: Reynold Bowen, MD ?Referred by: Reynold Bowen, MD ? ?Subjective: ?Chief Complaint  ?Patient presents with  ? Lower Back - Pain  ? Left Hip - Pain  ? Left Knee - Pain  ? Left Foot - Pain  ? ?HPI:  Shelby Walker is a 88 y.o. female who comes in today at the request of Barnet Pall, FNP for planned Left L3-4 Lumbar Transforaminal epidural steroid injection with fluoroscopic guidance.  The patient has failed conservative care including home exercise, medications, time and activity modification.  This injection will be diagnostic and hopefully therapeutic.  Please see requesting physician notes for further details and justification. ? ?ROS Otherwise per HPI. ? ?Assessment & Plan: ?Visit Diagnoses:  ?  ICD-10-CM   ?1. Lumbar radiculopathy  M54.16 XR C-ARM NO REPORT  ?  Epidural Steroid injection  ?  methylPREDNISolone acetate (DEPO-MEDROL) injection 80 mg  ?  ?  ?Plan: No additional findings.  ? ?Meds & Orders:  ?Meds ordered this encounter  ?Medications  ? methylPREDNISolone acetate (DEPO-MEDROL) injection 80 mg  ?  ?Orders Placed This Encounter  ?Procedures  ? XR C-ARM NO REPORT  ? Epidural Steroid injection  ?  ?Follow-up: Return if symptoms worsen or fail to improve.  ? ?Procedures: ?No procedures performed  ?Lumbosacral Transforaminal Epidural Steroid Injection - Sub-Pedicular Approach with Fluoroscopic Guidance ? ?Patient: Shelby Walker      ?Date of Birth: [birth year] ?MRN: [id number] ?PCP: Reynold Bowen, MD      ?Visit Date: 06/01/2021 ?  ?Universal Protocol:    ?Date/Time: 06/01/2021 ? ?Consent Given By: the patient ? ?Position: PRONE ? ?Additional Comments: ?Vital signs were monitored before and after the procedure. ?Patient was prepped and draped in the usual sterile fashion. ?The correct patient, procedure, and site was verified. ? ? ?Injection Procedure Details:  ? ?Procedure diagnoses:  Lumbar radiculopathy [M54.16]   ? ?Meds Administered:  ?Meds ordered this encounter  ?Medications  ? methylPREDNISolone acetate (DEPO-MEDROL) injection 80 mg  ? ? ?Laterality: Left ? ?Location/Site: L3 ? ?Needle:5.0 in., 22 ga.  Short bevel or Quincke spinal needle ? ?Needle Placement: Transforaminal ? ?Findings: ?  ? -Comments: Excellent flow of contrast along the nerve, nerve root and into the epidural space. ? ?Procedure Details: ?After squaring off the end-plates to get a true AP view, the C-arm was positioned so that an oblique view of the foramen as noted above was visualized. The target area is just inferior to the "nose of the scotty dog" or sub pedicular. The soft tissues overlying this structure were infiltrated with 2-3 ml. of 1% Lidocaine without Epinephrine. ? ?The spinal needle was inserted toward the target using a "trajectory" view along the fluoroscope beam.  Under AP and lateral visualization, the needle was advanced so it did not puncture dura and was located close the 6 O'Clock position of the pedical in AP tracterory. Biplanar projections were used to confirm position. Aspiration was confirmed to be negative for CSF and/or blood. A 1-2 ml. volume of Isovue-250 was injected and flow of contrast was noted at each level. Radiographs were obtained for documentation purposes.  ? ?After attaining the desired flow of contrast documented above, a 0.5 to 1.0 ml test dose of 0.25% Marcaine was injected into each respective transforaminal space.  The patient was observed for 90 seconds post injection.  After no sensory deficits were  reported, and normal lower extremity motor function was noted,   the above injectate was administered so that equal amounts of the injectate were placed at each foramen (level) into the transforaminal epidural space. ? ? ?Additional Comments:  ?The patient tolerated the procedure well ?Dressing: 2 x 2 sterile gauze and Band-Aid ?  ? ?Post-procedure details: ?Patient was  observed during the procedure. ?Post-procedure instructions were reviewed. ? ?Patient left the clinic in stable condition. ?  ? ?Clinical History: ?EXAM: ?MRI LUMBAR SPINE WITHOUT CONTRAST ?  ?TECHNIQUE: ?Multiplanar, multisequence MR imaging of the lumbar spine was ?performed. No intravenous contrast was administered. ?  ?COMPARISON:  RADIOGRAPHY OF THE LUMBAR DATED 11/16/2020. RADIOGRAPHY ?OF THE LUMBAR DATED 08/07/2018. ?  ?FINDINGS: ?Segmentation:  Standard. ?  ?Alignment: Grade 1 anterolisthesis of L3 on L4, L4 on L5 and L5-S1 ?secondary to facet disease. ?  ?Vertebrae: No acute fracture, evidence of discitis, or aggressive ?bone lesion. ?  ?Conus medullaris and cauda equina: Conus extends to the L1-2 level. ?Conus and cauda equina appear normal. ?  ?Paraspinal and other soft tissues: No acute paraspinal abnormality. ?Right renal cyst. ?  ?Disc levels: ?  ?Disc spaces: Degenerative disease with disc height loss at L3-4, ?L4-5 and L5-S1. ?  ?T12-L1: Minimal broad-based disc bulge. No foraminal or central ?canal stenosis. ?  ?L1-L2: No significant disc bulge. No neural foraminal stenosis. No ?central canal stenosis. ?  ?L2-L3: Minimal broad-based disc bulge. Mild bilateral facet ?arthropathy. No foraminal or central canal stenosis. ?  ?L3-L4: Broad-based disc bulge with a broad left paracentral disc ?protrusion with mass effect on the left intraspinal L4 nerve root. ?Moderate bilateral facet arthropathy. Moderate-severe spinal ?stenosis. Left subarticular recess stenosis. Moderate left foraminal ?stenosis. Mild right foraminal stenosis. ?  ?L4-L5: Broad-based disc bulge with a broad central disc protrusion. ?Severe bilateral facet arthropathy with ligamentum flavum infolding. ?Severe spinal stenosis. Mild right and severe left foraminal ?stenosis. ?  ?L5-S1: Mild broad-based disc bulge with a broad shallow right ?paracentral disc protrusion with mass effect on the right ?intraspinal S1 nerve root. Mild bilateral  foraminal stenosis. Mild ?bilateral facet arthropathy. No spinal stenosis. ?  ?IMPRESSION: ?1. Lumbar spine spondylosis as described above most severe at L3-4, ?L4-5 and L5-S1. ?2. No acute osseous injury of the lumbar spine. ?  ?  ?Electronically Signed ?  By: Kathreen Devoid M.D. ?  On: 11/23/2020 08:11  ? ? ? ?Objective:  VS:  HT:    WT:   BMI:     BP:(!) 106/46  HR:74bpm  TEMP: ( )  RESP:  ?Physical Exam ?Vitals and nursing note reviewed.  ?Constitutional:   ?   General: She is not in acute distress. ?   Appearance: Normal appearance. She is not ill-appearing.  ?HENT:  ?   Head: Normocephalic and atraumatic.  ?   Right Ear: External ear normal.  ?   Left Ear: External ear normal.  ?Eyes:  ?   Extraocular Movements: Extraocular movements intact.  ?Cardiovascular:  ?   Rate and Rhythm: Normal rate.  ?   Pulses: Normal pulses.  ?Pulmonary:  ?   Effort: Pulmonary effort is normal. No respiratory distress.  ?Abdominal:  ?   General: There is no distension.  ?   Palpations: Abdomen is soft.  ?Musculoskeletal:     ?   General: Tenderness present.  ?   Cervical back: Neck supple.  ?   Right lower leg: No edema.  ?   Left lower leg: No edema.  ?  Comments: Patient has good distal strength with no pain over the greater trochanters.  No clonus or focal weakness.  ?Skin: ?   Findings: No erythema, lesion or rash.  ?Neurological:  ?   General: No focal deficit present.  ?   Mental Status: She is alert and oriented to person, place, and time.  ?   Sensory: No sensory deficit.  ?   Motor: No weakness or abnormal muscle tone.  ?   Coordination: Coordination normal.  ?Psychiatric:     ?   Mood and Affect: Mood normal.     ?   Behavior: Behavior normal.  ?  ? ?Imaging: ?No results found. ?

## 2021-06-05 ENCOUNTER — Ambulatory Visit (INDEPENDENT_AMBULATORY_CARE_PROVIDER_SITE_OTHER): Payer: Medicare Other | Admitting: Internal Medicine

## 2021-06-05 ENCOUNTER — Encounter: Payer: Self-pay | Admitting: Internal Medicine

## 2021-06-05 VITALS — BP 136/62 | HR 70 | Ht 61.5 in | Wt 120.0 lb

## 2021-06-05 DIAGNOSIS — I480 Paroxysmal atrial fibrillation: Secondary | ICD-10-CM | POA: Diagnosis not present

## 2021-06-05 LAB — CBC WITH DIFFERENTIAL/PLATELET
Basophils Absolute: 0 10*3/uL (ref 0.0–0.2)
Basos: 0 %
EOS (ABSOLUTE): 0.1 10*3/uL (ref 0.0–0.4)
Eos: 1 %
Hematocrit: 30.7 % — ABNORMAL LOW (ref 34.0–46.6)
Hemoglobin: 9.8 g/dL — ABNORMAL LOW (ref 11.1–15.9)
Lymphocytes Absolute: 1.5 10*3/uL (ref 0.7–3.1)
Lymphs: 22 %
MCH: 27.3 pg (ref 26.6–33.0)
MCHC: 31.9 g/dL (ref 31.5–35.7)
MCV: 86 fL (ref 79–97)
Monocytes Absolute: 1 10*3/uL — ABNORMAL HIGH (ref 0.1–0.9)
Monocytes: 14 %
Neutrophils Absolute: 4.4 10*3/uL (ref 1.4–7.0)
Neutrophils: 63 %
Platelets: 252 10*3/uL (ref 150–450)
RBC: 3.59 x10E6/uL — ABNORMAL LOW (ref 3.77–5.28)
RDW: 15.3 % (ref 11.7–15.4)
WBC: 7 10*3/uL (ref 3.4–10.8)

## 2021-06-05 NOTE — Progress Notes (Signed)
? ? ? ? ?Patient Care Team: ?Reynold Bowen, MD as PCP - General (Endocrinology) ?Nahser, Wonda Cheng, MD as PCP - Cardiology (Cardiology) ? ? ?HPI ? ?Shelby Walker is a 88 y.o. female ?Seen with anticipation of loop recorder insertion to monitor for ongoing afib following an episode following C diff colitis ?The question being ongoing risk of A-fib and thromboembolism. ? ?Breathing has been stable.  No chest pain.  Scant edema.  She has intercurrently undergone amputation of her toes secondary to diabetic wounds.  She is also fallen, saying her feet got caught on the carpet while she was wearing loosely affixed slippers ? ?DATE TEST EF    ?10/17 Myoview  77 %    ?5/20 Echo  60-65 %    ?8/21 Echo 60-65%    ?         ?  ?  ?Date Cr K Hgb  ?12/22 1.25 4.2 8.3  ?         ?  ? ?Records and Results Reviewed  ? ?Past Medical History:  ?Diagnosis Date  ? Bruit   ? Carotid Doppler showed no significant abnormality     9per patient)  ? C. difficile colitis 07/2018  ? with severe sepsis  ? Chest pain, unspecified   ? Nuclear, May, 2008, no scar or ischemia  ? Diabetes mellitus   ? Diverticulosis   ? Dyslipidemia   ? Ejection fraction   ? EF 55-60%, echo, February, 2011 // Echocardiogram 8/21: EF 60-65, no RWMA, Gr 1 DD, GLS -14%, normal RVSF, mild LAE, trivial MR, mild MS (mean 4 mmHg), RVSP 23.4   ? GERD (gastroesophageal reflux disease)   ? Mitral regurgitation April 21, 2009  ? mild,  echo, February, 2011  ? Osteoporosis   ? Palpitations   ? possible very brief atrial fibrillation on monitor and possible reentrant tachycardia  ? Psoriasis   ? Rheumatoid arthritis ([place])   ? ? ?Past Surgical History:  ?Procedure Laterality Date  ? ABDOMINAL AORTOGRAM W/LOWER EXTREMITY Right 10/19/2020  ? Procedure: ABDOMINAL AORTOGRAM W/LOWER EXTREMITY;  Surgeon: Wellington Hampshire, MD;  Location: [facility];  Service: Cardiovascular;  Laterality: Right;  ? FRACTURE SURGERY    ? TRANSMETATARSAL AMPUTATION Right 01/18/2021  ? Procedure:  TRANSMETATARSAL AMPUTATION;  Surgeon: Wylene Simmer, MD;  Location: [facility];  Service: Orthopedics;  Laterality: Right;  ? ? ?Current Meds  ?Medication Sig  ? acetaminophen-codeine (TYLENOL #3) 300-30 MG tablet Take 1 tablet by mouth in the morning, at noon, and at bedtime. (Patient taking differently: Take 1 tablet by mouth as needed.)  ? atorvastatin (LIPITOR) 20 MG tablet Take 1 tablet (20 mg total) by mouth daily. Please make yearly appt with Dr. Acie Fredrickson for July 2023 for future refills. Thank you 1st attempt  ? Calcium Carbonate-Vitamin D 600-400 MG-UNIT tablet Take 1 tablet by mouth daily at 12 noon.   ? carvedilol (COREG) 6.25 MG tablet Take 1 tablet (6.25 mg total) by mouth 2 (two) times daily with a meal.  ? diclofenac Sodium (VOLTAREN) 1 % GEL Apply 2 g topically 4 (four) times daily.  ? esomeprazole (NEXIUM) 20 MG capsule Take 20 mg by mouth in the morning.  ? gabapentin (NEURONTIN) 100 MG capsule Take 1 capsule (100 mg total) by mouth 3 (three) times daily.  ? insulin aspart (NOVOLOG) 100 UNIT/ML FlexPen Inject 3 Units into the skin 3 (three) times daily with meals. Sliding scale  ? Insulin Degludec (TRESIBA [place]) Inject 6-8  Units into the skin at bedtime. Sliding scale  ? lidocaine (LIDODERM) 5 % Place 1 patch onto the skin daily. Purchase over the counter --on for 12 hours and off for 12 hours (Patient taking differently: Place 1 patch onto the skin as needed (pain). Purchase over the counter --on for 12 hours and off for 12 hours)  ? lip balm (CARMEX) ointment Apply topically as needed for lip care.  ? liver oil-zinc oxide (DESITIN) 40 % ointment Apply 1 application topically 3 (three) times daily. To raw areas in between/on buttocks  ? Multiple Vitamins-Minerals (CENTRUM SILVER 50+WOMEN PO) Take 1 tablet by mouth daily.  ? nitroGLYCERIN (NITROSTAT) 0.4 MG SL tablet Place 1 tablet (0.4 mg total) under the tongue every 5 (five) minutes as needed for chest pain.  ? Nutritional Supplements (ENSURE ACTIVE) LIQD  Take 237 mLs by mouth in the morning. Prefers Chocolate  ? predniSONE (DELTASONE) 1 MG tablet Take 4 mg by mouth in the morning.  ? pyrithione zinc (SELSUN BLUE DRY SCALP) 1 % shampoo Apply 1 application topically once a week.  ? RESTASIS 0.05 % ophthalmic emulsion Place 1 drop into both eyes 2 (two) times daily.  ? saccharomyces boulardii (FLORASTOR) 250 MG capsule Take 250 mg by mouth daily as needed (to take when taking an antibiotic).  ? [DISCONTINUED] albuterol (VENTOLIN HFA) 108 (90 Base) MCG/ACT inhaler Inhale 1-2 puffs into the lungs every 6 (six) hours as needed for wheezing or shortness of breath.  ? [DISCONTINUED] apixaban (ELIQUIS) 2.5 MG TABS tablet Take 1 tablet by mouth 2 times daily  ? ? ?Allergies  ?Allergen Reactions  ? Cholestyramine   ?  Possible- Makes throat burn. Patient said she can't take it   ? Compazine [Prochlorperazine Edisylate] Swelling  ?  REACTION: " tongue swell and unable to swallow"  ? Sulfonamide Derivatives Other (See Comments)  ?  REACTION: " broke out with fine itching bumps"  ? Hydrocodone Other (See Comments)  ? Infliximab Other (See Comments)  ?  Other reaction(s): rash  ? Leflunomide   ?  Other reaction(s): diarrhea  ? Prochlorperazine Other (See Comments)  ?  Other reaction(s): Unknown  ? Rosuvastatin   ?  Other reaction(s): muscle aches  ? Sulfa Antibiotics Other (See Comments)  ?  Other reaction(s): tongue swelling  ? ? ? ? ?Review of Systems negative except from HPI and PMH ? ?Physical Exam ?BP 136/62   Pulse 70   Ht 5' 1.5" (1.562 m)   Wt 120 lb (54.4 kg)   SpO2 99%   BMI 22.31 kg/m?  ?Well developed and cachectic in no acute distress ?HENT normal ?E scleral and icterus clear ?Neck Supplell ?Clear to ausculation ?Regular rate and rhythm, no murmurs gallops or rub ?Soft with active bowel sounds ?No clubbing cyanosis Trace Edema ?Alert and oriented, grossly normal motor and sensory function ?Skin Warm and Dry ?You are welcome had ?ECG*sinus at  65 ?16/08/38 ?Otherwise normal ? ?CrCl cannot be calculated (Patient's most recent lab result is older than the maximum 21 days allowed.). ? ? ?Assessment and  Plan ?Atrial fibrillation-persistent ?  ?Fall  ?  ?Peripheral vascular disease with prior revascularization ?  ?Stable fluid status, ? ?Blood pressure is stable we will continue her on carvedilol 6.25 twice daily ? ?Now with monitoring and no interval atrial fibrillation, we will discontinue her Eliquis and hold Ativan since resumption pending recurrence of atrial fibrillation. ? ?Have recommended discussing with primary care strategies for avoiding falls i.e. carpets  and slippers etc. stressed importance of tightening her slippers ? ? ?Pre op Dx atrial fibrillation   ?Post op Dx Same ? ?Procedure  Loop Recorder implantation ? ?After routine prep and drape of the left parasternal area, a small incision was created. A Med[employer]onic LINQ Reveal Loop Recorder  Serial Number  [id number] was inserted.   ? ?SteriStrip dressing was  applied. ? ?The patient tolerated the procedure without apparent complication. ? ?EBL < 10cc ? ? ? ?Current medicines are reviewed at length with the patient today .  The patient does not   have concerns regarding medicines. ? ?

## 2021-06-05 NOTE — Patient Instructions (Addendum)
Medication Instructions:  ?Your physician has recommended you make the following change in your medication:  ? ? STOP ELIQUIS ? ?Labwork: ?None ordered. ? ?Testing/Procedures: ?None ordered. ? ?Follow-Up: ? ?Your physician wants you to follow-up in: one year with Dr. Caryl Comes.  You will receive a reminder letter in the mail two months in advance. If you don't receive a letter, please call our office to schedule the follow-up appointment. ? ?Implantable Loop Recorder Placement, Care After ?This sheet gives you information about how to care for yourself after your procedure. Your health care provider may also give you more specific instructions. If you have problems or questions, contact your health care provider. ?What can I expect after the procedure? ?After the procedure, it is common to have: ?Soreness or discomfort near the incision. ?Some swelling or bruising near the incision. ? ?Follow these instructions at home: ?Incision care ? ?Leave your outer dressing on for 72 hours.   ?You may shower in 24 hours. ?Leave adhesive strips in place. These skin closures may need to stay in place for 1-2 weeks. If adhesive strip edges start to loosen and curl up, you may trim the loose edges.  You may remove the strips if they have not fallen off after 2 weeks. ?Check your incision area every day for signs of infection. Check for: ?Redness, swelling, or pain. ?Fluid or blood. ?Warmth. ?Pus or a bad smell. ?Do not take baths, swim, or use a hot tub until your incision is completely healed. ?If your wound site starts to bleed apply pressure.   ?   ?If you have any questions/concerns please call the device clinic at [phone]. ? ?Activity ? ?Return to your normal activities. ? ?General instructions ?Follow instructions from your health care provider about how to manage your implantable loop recorder and transmit the information. Learn how to activate a recording if this is necessary for your type of device. ?You may go through a  metal detection gate, and you may let someone hold a metal detector over your chest. Show your ID card if needed. ?Do not have an MRI unless you check with your health care provider first. ?Take over-the-counter and prescription medicines only as told by your health care provider. ?Keep all follow-up visits as told by your health care provider. This is important. ?Contact a health care provider if: ?You have redness, swelling, or pain around your incision. ?You have a fever. ?You have pain that is not relieved by your pain medicine. ?You have triggered your device because of fainting (syncope) or because of a heartbeat that feels like it is racing, slow, fluttering, or skipping (palpitations). ?Get help right away if you have: ?Chest pain. ?Difficulty breathing. ?Summary ?After the procedure, it is common to have soreness or discomfort near the incision. ?Change your dressing as told by your health care provider. ?Follow instructions from your health care provider about how to manage your implantable loop recorder and transmit the information. ?Keep all follow-up visits as told by your health care provider. This is important. ?This information is not intended to replace advice given to you by your health care provider. Make sure you discuss any questions you have with your health care provider. ?Document Released: 01/31/2015 Document Revised: 04/06/2017 Document Reviewed: 04/06/2017 ?Elsevier Patient Education ? [place]. ? ?

## 2021-06-06 ENCOUNTER — Encounter: Payer: Self-pay | Admitting: Cardiovascular Disease

## 2021-06-06 ENCOUNTER — Ambulatory Visit (INDEPENDENT_AMBULATORY_CARE_PROVIDER_SITE_OTHER): Payer: Medicare Other | Admitting: Cardiovascular Disease

## 2021-06-06 VITALS — BP 108/64 | HR 75 | Ht 61.5 in | Wt 120.0 lb

## 2021-06-06 DIAGNOSIS — I739 Peripheral vascular disease, unspecified: Secondary | ICD-10-CM

## 2021-06-06 DIAGNOSIS — R051 Acute cough: Secondary | ICD-10-CM | POA: Diagnosis not present

## 2021-06-06 DIAGNOSIS — Z20822 Contact with and (suspected) exposure to covid-19: Secondary | ICD-10-CM | POA: Diagnosis not present

## 2021-06-06 DIAGNOSIS — E785 Hyperlipidemia, unspecified: Secondary | ICD-10-CM

## 2021-06-06 DIAGNOSIS — I480 Paroxysmal atrial fibrillation: Secondary | ICD-10-CM

## 2021-06-06 DIAGNOSIS — R059 Cough, unspecified: Secondary | ICD-10-CM | POA: Diagnosis not present

## 2021-06-06 NOTE — Patient Instructions (Signed)
Medication Instructions:  No changes *If you need a refill on your cardiac medications before your next appointment, please call your pharmacy*   Lab Work: None ordered If you have labs (blood work) drawn today and your tests are completely normal, you will receive your results only by: . MyChart Message (if you have MyChart) OR . A paper copy in the mail If you have any lab test that is abnormal or we need to change your treatment, we will call you to review the results.   Testing/Procedures: None ordered   Follow-Up: At [facility], you and your health needs are our priority.  As part of our continuing mission to provide you with exceptional heart care, we have created designated Provider Care Teams.  These Care Teams include your primary Cardiologist (physician) and Advanced Practice Providers (APPs -  Physician Assistants and Nurse Practitioners) who all work together to provide you with the care you need, when you need it.  We recommend signing up for the patient portal called "MyChart".  Sign up information is provided on this After Visit Summary.  MyChart is used to connect with patients for Virtual Visits (Telemedicine).  Patients are able to view lab/test results, encounter notes, upcoming appointments, etc.  Non-urgent messages can be sent to your provider as well.   To learn more about what you can do with MyChart, go to [email or web address].    Your next appointment:   Follow up as needed with Dr. Arida  

## 2021-06-06 NOTE — Progress Notes (Signed)
?  ?Cardiology Office Note ? ? ?Date:  06/06/2021  ? ?ID:  Shelby Walker, DOB 11/10/1933, MRN [id number] ? ?PCP:  Reynold Bowen, MD  ?Cardiologist:  Dr. Acie Fredrickson ? ?Chief Complaint  ?Patient presents with  ? Follow-up  ? ? ? ?  ?History of Present Illness: ?Shelby Walker is a 88 y.o. female who is referred for evaluation management of peripheral arterial disease.  She has known history of paroxysmal atrial fibrillation on anticoagulation with Eliquis, previous atypical chest pain with negative cardiac work-up, type 2 diabetes, hyperlipidemia, chronic kidney disease and rheumatoid arthritis. ? ?She was seen last year for right foot swelling with a small ulceration on the bottom of the foot. ?She underwent recent noninvasive vascular studies which showed normal ABI bilaterally.  Previous duplex in 2021 showed moderate bilateral SFA disease.  Venous Doppler was negative for DVT. ?I proceeded with angiogram in August which showed no significant aortoiliac disease.  On the right side, there was moderate SFA and popliteal artery disease with two-vessel runoff below the knee.  I did not feel that revascularization was indicated.  She was treated medically and the ulceration ultimately healed.  She is more bothered by left leg swelling at the present time with some discoloration.  There is likely a component of chronic venous insufficiency. ? ?Past Medical History:  ?Diagnosis Date  ? Bruit   ? Carotid Doppler showed no significant abnormality     9per patient)  ? C. difficile colitis 07/2018  ? with severe sepsis  ? Chest pain, unspecified   ? Nuclear, May, 2008, no scar or ischemia  ? Diabetes mellitus   ? Diverticulosis   ? Dyslipidemia   ? Ejection fraction   ? EF 55-60%, echo, February, 2011 // Echocardiogram 8/21: EF 60-65, no RWMA, Gr 1 DD, GLS -14%, normal RVSF, mild LAE, trivial MR, mild MS (mean 4 mmHg), RVSP 23.4   ? GERD (gastroesophageal reflux disease)   ? Mitral regurgitation April 21, 2009  ? mild,  echo,  February, 2011  ? Osteoporosis   ? Palpitations   ? possible very brief atrial fibrillation on monitor and possible reentrant tachycardia  ? Psoriasis   ? Rheumatoid arthritis ([place])   ? ? ?Past Surgical History:  ?Procedure Laterality Date  ? ABDOMINAL AORTOGRAM W/LOWER EXTREMITY Right 10/19/2020  ? Procedure: ABDOMINAL AORTOGRAM W/LOWER EXTREMITY;  Surgeon: Wellington Hampshire, MD;  Location: [facility];  Service: Cardiovascular;  Laterality: Right;  ? FRACTURE SURGERY    ? TRANSMETATARSAL AMPUTATION Right 01/18/2021  ? Procedure: TRANSMETATARSAL AMPUTATION;  Surgeon: Wylene Simmer, MD;  Location: [facility];  Service: Orthopedics;  Laterality: Right;  ? ? ? ?Current Outpatient Medications  ?Medication Sig Dispense Refill  ? acetaminophen-codeine (TYLENOL #3) 300-30 MG tablet Take 1 tablet by mouth in the morning, at noon, and at bedtime. (Patient taking differently: Take 1 tablet by mouth as needed.) 24 tablet 0  ? atorvastatin (LIPITOR) 20 MG tablet Take 1 tablet (20 mg total) by mouth daily. Please make yearly appt with Dr. Acie Fredrickson for July 2023 for future refills. Thank you 1st attempt 90 tablet 0  ? Calcium Carbonate-Vitamin D 600-400 MG-UNIT tablet Take 1 tablet by mouth daily at 12 noon.     ? carvedilol (COREG) 6.25 MG tablet Take 1 tablet (6.25 mg total) by mouth 2 (two) times daily with a meal. 180 tablet 2  ? diclofenac Sodium (VOLTAREN) 1 % GEL Apply 2 g topically 4 (four) times daily. 350 g  0  ? esomeprazole (NEXIUM) 20 MG capsule Take 20 mg by mouth in the morning.    ? gabapentin (NEURONTIN) 100 MG capsule Take 1 capsule (100 mg total) by mouth 3 (three) times daily. 90 capsule 0  ? insulin aspart (NOVOLOG) 100 UNIT/ML FlexPen Inject 3 Units into the skin 3 (three) times daily with meals. Sliding scale 15 mL 11  ? Insulin Degludec (TRESIBA [place]) Inject 6-8 Units into the skin at bedtime. Sliding scale    ? lidocaine (LIDODERM) 5 % Place 1 patch onto the skin daily. Purchase over the counter --on for 12  hours and off for 12 hours (Patient taking differently: Place 1 patch onto the skin as needed (pain). Purchase over the counter --on for 12 hours and off for 12 hours) 30 patch 0  ? lip balm (CARMEX) ointment Apply topically as needed for lip care. 7 g 0  ? liver oil-zinc oxide (DESITIN) 40 % ointment Apply 1 application topically 3 (three) times daily. To raw areas in between/on buttocks 56.7 g 0  ? Multiple Vitamins-Minerals (CENTRUM SILVER 50+WOMEN PO) Take 1 tablet by mouth daily.    ? nitroGLYCERIN (NITROSTAT) 0.4 MG SL tablet Place 1 tablet (0.4 mg total) under the tongue every 5 (five) minutes as needed for chest pain. 25 tablet 6  ? Nutritional Supplements (ENSURE ACTIVE) LIQD Take 237 mLs by mouth in the morning. Prefers Chocolate    ? predniSONE (DELTASONE) 1 MG tablet Take 4 mg by mouth in the morning.    ? pyrithione zinc (SELSUN BLUE DRY SCALP) 1 % shampoo Apply 1 application topically once a week.    ? RESTASIS 0.05 % ophthalmic emulsion Place 1 drop into both eyes 2 (two) times daily.    ? saccharomyces boulardii (FLORASTOR) 250 MG capsule Take 250 mg by mouth daily as needed (to take when taking an antibiotic).    ? ?No current facility-administered medications for this visit.  ? ? ?Allergies:   Cholestyramine, Compazine [prochlorperazine edisylate], Sulfonamide derivatives, Hydrocodone, Infliximab, Leflunomide, Prochlorperazine, Rosuvastatin, and Sulfa antibiotics  ? ? ?Social History:  The patient  reports that she has never smoked. She has never used smokeless tobacco. She reports that she does not drink alcohol and does not use drugs.  ? ?Family History:  The patient's family history includes Diabetes in her father and mother; Heart attack in her father and mother; Heart failure in her father and mother; Stroke in her sister.  ? ? ?ROS:  Please see the history of present illness.   Otherwise, review of systems are positive for none.   All other systems are reviewed and negative.  ? ? ?PHYSICAL  EXAM: ?VS:  BP 108/64 (BP Location: Left Arm, Patient Position: Sitting, Cuff Size: Normal)   Pulse 75   Ht 5' 1.5" (1.562 m)   Wt 120 lb (54.4 kg)   BMI 22.31 kg/m?  , BMI Body mass index is 22.31 kg/m?. ?GEN: Well nourished, well developed, in no acute distress  ?HEENT: normal  ?Neck: no JVD, or masses.  Left carotid bruit. ?Cardiac: RRR; norubs, or gallops, mild bilateral leg edema especially on the left with stasis dermatitis..  2 out of 6 systolic murmur in the aortic area. ?Respiratory:  clear to auscultation bilaterally, normal work of breathing ?GI: soft, nontender, nondistended, + BS ?MS: no deformity or atrophy  ?Skin: warm and dry, no rash ?Neuro:  Strength and sensation are intact ?Psych: euthymic mood, full affect ?Vascular: Femoral pulses normal bilaterally.  Dorsalis  pedis is palpable on the right side but the posterior tibial is not palpable.  There is small ulceration on the bottom of the right foot ? ? ?EKG:  EKG is not ordered today. ? ? ? ?Recent Labs: ?01/19/2021: Magnesium 1.7 ?01/25/2021: ALT 11 ?02/04/2021: BUN 53; Creatinine, Ser 1.25; Potassium 4.2; Sodium 137 ?06/05/2021: Hemoglobin 9.8; Platelets 252  ? ? ?Lipid Panel ?   ?Component Value Date/Time  ? CHOL 102 01/18/2021 0132  ? CHOL 129 12/24/2016 1131  ? TRIG 121 01/18/2021 0132  ? HDL 36 (L) 01/18/2021 0132  ? HDL 52 12/24/2016 1131  ? CHOLHDL 2.8 01/18/2021 0132  ? VLDL 24 01/18/2021 0132  ? [place] 42 01/18/2021 0132  ? [place] 47 12/24/2016 1131  ? ?  ? ?Wt Readings from Last 3 Encounters:  ?06/06/21 120 lb (54.4 kg)  ?06/05/21 120 lb (54.4 kg)  ?04/07/21 124 lb (56.2 kg)  ?  ? ? ? ? ?  09/28/2016  ?  1:31 PM  ?PAD Screen  ?Pain with walking? Yes  ?Subsides with rest? Yes  ?Feet/toe relief with dangling? Yes  ?Painful, non-healing ulcers? No  ?Extremities discolored? Yes  ? ? ? ? ?ASSESSMENT AND PLAN: ? ?1.  Peripheral arterial disease: Angiogram last year showed moderate right SFA and popliteal artery disease.  Currently with no  claudication or ulceration.  Recommend continuing medical therapy. ? ?2.  Paroxysmal atrial fibrillation: She had a loop recorder done yesterday by Dr. Caryl Comes and was taken off Eliquis  ? ?3.  Hyperlipidemia: Continu

## 2021-06-07 DIAGNOSIS — I129 Hypertensive chronic kidney disease with stage 1 through stage 4 chronic kidney disease, or unspecified chronic kidney disease: Secondary | ICD-10-CM | POA: Diagnosis not present

## 2021-06-07 DIAGNOSIS — E1122 Type 2 diabetes mellitus with diabetic chronic kidney disease: Secondary | ICD-10-CM | POA: Diagnosis not present

## 2021-06-07 DIAGNOSIS — N189 Chronic kidney disease, unspecified: Secondary | ICD-10-CM | POA: Diagnosis not present

## 2021-06-07 DIAGNOSIS — Z4781 Encounter for orthopedic aftercare following surgical amputation: Secondary | ICD-10-CM | POA: Diagnosis not present

## 2021-06-07 DIAGNOSIS — E1151 Type 2 diabetes mellitus with diabetic peripheral angiopathy without gangrene: Secondary | ICD-10-CM | POA: Diagnosis not present

## 2021-06-07 DIAGNOSIS — E11628 Type 2 diabetes mellitus with other skin complications: Secondary | ICD-10-CM | POA: Diagnosis not present

## 2021-06-09 DIAGNOSIS — E1142 Type 2 diabetes mellitus with diabetic polyneuropathy: Secondary | ICD-10-CM | POA: Diagnosis not present

## 2021-06-09 DIAGNOSIS — Z7901 Long term (current) use of anticoagulants: Secondary | ICD-10-CM | POA: Diagnosis not present

## 2021-06-09 DIAGNOSIS — E1122 Type 2 diabetes mellitus with diabetic chronic kidney disease: Secondary | ICD-10-CM | POA: Diagnosis not present

## 2021-06-09 DIAGNOSIS — I129 Hypertensive chronic kidney disease with stage 1 through stage 4 chronic kidney disease, or unspecified chronic kidney disease: Secondary | ICD-10-CM | POA: Diagnosis not present

## 2021-06-09 DIAGNOSIS — Z79891 Long term (current) use of opiate analgesic: Secondary | ICD-10-CM | POA: Diagnosis not present

## 2021-06-09 DIAGNOSIS — M810 Age-related osteoporosis without current pathological fracture: Secondary | ICD-10-CM | POA: Diagnosis not present

## 2021-06-09 DIAGNOSIS — E11628 Type 2 diabetes mellitus with other skin complications: Secondary | ICD-10-CM | POA: Diagnosis not present

## 2021-06-09 DIAGNOSIS — K219 Gastro-esophageal reflux disease without esophagitis: Secondary | ICD-10-CM | POA: Diagnosis not present

## 2021-06-09 DIAGNOSIS — I4891 Unspecified atrial fibrillation: Secondary | ICD-10-CM | POA: Diagnosis not present

## 2021-06-09 DIAGNOSIS — E785 Hyperlipidemia, unspecified: Secondary | ICD-10-CM | POA: Diagnosis not present

## 2021-06-09 DIAGNOSIS — Z9181 History of falling: Secondary | ICD-10-CM | POA: Diagnosis not present

## 2021-06-09 DIAGNOSIS — Z7952 Long term (current) use of systemic steroids: Secondary | ICD-10-CM | POA: Diagnosis not present

## 2021-06-09 DIAGNOSIS — E1151 Type 2 diabetes mellitus with diabetic peripheral angiopathy without gangrene: Secondary | ICD-10-CM | POA: Diagnosis not present

## 2021-06-09 DIAGNOSIS — I051 Rheumatic mitral insufficiency: Secondary | ICD-10-CM | POA: Diagnosis not present

## 2021-06-09 DIAGNOSIS — Z8631 Personal history of diabetic foot ulcer: Secondary | ICD-10-CM | POA: Diagnosis not present

## 2021-06-09 DIAGNOSIS — E46 Unspecified protein-calorie malnutrition: Secondary | ICD-10-CM | POA: Diagnosis not present

## 2021-06-09 DIAGNOSIS — Z4781 Encounter for orthopedic aftercare following surgical amputation: Secondary | ICD-10-CM | POA: Diagnosis not present

## 2021-06-09 DIAGNOSIS — I6529 Occlusion and stenosis of unspecified carotid artery: Secondary | ICD-10-CM | POA: Diagnosis not present

## 2021-06-09 DIAGNOSIS — Z89431 Acquired absence of right foot: Secondary | ICD-10-CM | POA: Diagnosis not present

## 2021-06-09 DIAGNOSIS — N189 Chronic kidney disease, unspecified: Secondary | ICD-10-CM | POA: Diagnosis not present

## 2021-06-09 DIAGNOSIS — Z794 Long term (current) use of insulin: Secondary | ICD-10-CM | POA: Diagnosis not present

## 2021-06-09 DIAGNOSIS — Z791 Long term (current) use of non-steroidal anti-inflammatories (NSAID): Secondary | ICD-10-CM | POA: Diagnosis not present

## 2021-06-09 DIAGNOSIS — K5790 Diverticulosis of intestine, part unspecified, without perforation or abscess without bleeding: Secondary | ICD-10-CM | POA: Diagnosis not present

## 2021-06-09 DIAGNOSIS — M069 Rheumatoid arthritis, unspecified: Secondary | ICD-10-CM | POA: Diagnosis not present

## 2021-06-16 ENCOUNTER — Encounter: Payer: Self-pay | Admitting: Physical Medicine and Rehabilitation

## 2021-06-16 ENCOUNTER — Telehealth: Payer: Self-pay | Admitting: Physical Medicine and Rehabilitation

## 2021-06-16 DIAGNOSIS — M069 Rheumatoid arthritis, unspecified: Secondary | ICD-10-CM | POA: Diagnosis not present

## 2021-06-16 NOTE — Telephone Encounter (Signed)
Pt's daughter lis called requesting a call back. Asking if PA Jinny Blossom has something later in the day. Pt has an appt but need alittle later in the day if possible. Please call pt at [phone]. ?

## 2021-06-19 ENCOUNTER — Encounter: Payer: Self-pay | Admitting: Physician Assistant

## 2021-06-19 ENCOUNTER — Ambulatory Visit (INDEPENDENT_AMBULATORY_CARE_PROVIDER_SITE_OTHER): Payer: Medicare Other | Admitting: Physician Assistant

## 2021-06-19 ENCOUNTER — Ambulatory Visit (HOSPITAL_COMMUNITY)
Admission: RE | Admit: 2021-06-19 | Discharge: 2021-06-19 | Disposition: A | Discharge status: Home / Self Care | Payer: Medicare Other | Source: Ambulatory Visit | Attending: Physician Assistant | Admitting: Physician Assistant

## 2021-06-19 DIAGNOSIS — M79605 Pain in left leg: Secondary | ICD-10-CM

## 2021-06-19 DIAGNOSIS — Z20822 Contact with and (suspected) exposure to covid-19: Secondary | ICD-10-CM | POA: Diagnosis not present

## 2021-06-19 DIAGNOSIS — M7989 Other specified soft tissue disorders: Secondary | ICD-10-CM | POA: Diagnosis not present

## 2021-06-19 MED ORDER — LIDOCAINE HCL 1 % IJ SOLN
3.0000 mL | INTRAMUSCULAR | Status: AC | PRN
  Administered 2021-06-19: 3 mL

## 2021-06-19 MED ORDER — METHYLPREDNISOLONE ACETATE 40 MG/ML IJ SUSP
40.0000 mg | INTRAMUSCULAR | Status: AC | PRN
  Administered 2021-06-19: 40 mg via INTRA_ARTICULAR

## 2021-06-19 NOTE — Progress Notes (Signed)
HPI: Shelby Walker comes in today requesting left knee injection.  She was last seen by Dr. Ninfa Linden on 04/24/2021 after mechanical fall and injured left knee.  She developed a hematoma.  She underwent CT scan of that knee rule out any type of fracture was found to have prepatellar bursitis and hematoma.  She has had no new injury to the knee.  She does note a sore on her left mid tib-fib anteriorly.  Night swelling left leg.  She wears compression stockings.  She is on no anticoagulants.  History of A-fib now on monitor and is off of Eliquis.  She also notes calf pain but no shortness of breath fevers chills.  She is diabetic but reports good control of her diabetes. ? ?Physical exam: General well-developed well-nourished female walks with a slight antalgic gait. ?Left knee full extension flexion beyond 90 degrees.  Slight effusion.  No abnormal warmth.  Tenderness left calf.  +2 edema left leg.  Right leg is slight edema and is noticeably smaller than the left leg. ? ? ? ? ?Procedure Note ? ?Patient: Shelby Walker             ?Date of Birth: September 28, 1933           ?MRN: [id number]             ?Visit Date: 06/19/2021 ? ?Procedures: ?Visit Diagnoses:  ?1. Pain in left leg   ? ? ?Large Joint Inj on 06/19/2021 11:59 AM ?Indications: pain ?Details: 22 G 1.5 in needle, anterolateral approach ? ?Arthrogram: No ? ?Medications: 3 mL lidocaine 1 %; 40 mg methylPREDNISolone acetate 40 MG/ML ?Aspirate: 13 mL yellow ?Outcome: tolerated well, no immediate complications ?Procedure, treatment alternatives, risks and benefits explained, specific risks discussed. Consent was given by the patient. Immediately prior to procedure a time out was called to verify the correct patient, procedure, equipment, support staff and site/side marked as required. Patient was prepped and draped in the usual sterile fashion.  ? ? ? ?Plan: She is given Xeroform to place over the wound with an OpSite she will leave this in place for 2 or 3 days and then take it  off and wash the area with antibacterial soap and apply a new piece of Xeroform new OpSite until area is healed.  We will order a Doppler of her left lower leg to rule out DVT given her swelling and calf pain.  Patient tolerated the aspiration injection knee well today.  Follow-up with [place] as needed. ? ?

## 2021-06-20 DIAGNOSIS — H04123 Dry eye syndrome of bilateral lacrimal glands: Secondary | ICD-10-CM | POA: Diagnosis not present

## 2021-06-20 DIAGNOSIS — Z20822 Contact with and (suspected) exposure to covid-19: Secondary | ICD-10-CM | POA: Diagnosis not present

## 2021-06-21 ENCOUNTER — Telehealth: Payer: Self-pay | Admitting: Physician Assistant

## 2021-06-21 DIAGNOSIS — Z4781 Encounter for orthopedic aftercare following surgical amputation: Secondary | ICD-10-CM | POA: Diagnosis not present

## 2021-06-21 DIAGNOSIS — E11628 Type 2 diabetes mellitus with other skin complications: Secondary | ICD-10-CM | POA: Diagnosis not present

## 2021-06-21 DIAGNOSIS — N189 Chronic kidney disease, unspecified: Secondary | ICD-10-CM | POA: Diagnosis not present

## 2021-06-21 DIAGNOSIS — E1151 Type 2 diabetes mellitus with diabetic peripheral angiopathy without gangrene: Secondary | ICD-10-CM | POA: Diagnosis not present

## 2021-06-21 DIAGNOSIS — I129 Hypertensive chronic kidney disease with stage 1 through stage 4 chronic kidney disease, or unspecified chronic kidney disease: Secondary | ICD-10-CM | POA: Diagnosis not present

## 2021-06-21 DIAGNOSIS — E1122 Type 2 diabetes mellitus with diabetic chronic kidney disease: Secondary | ICD-10-CM | POA: Diagnosis not present

## 2021-06-21 NOTE — Telephone Encounter (Signed)
Pt's daughter [name] called requesting a call back from PA clark. Still has concerns about one of the areas on pt's leg. Please call [name] at [phone] ?

## 2021-06-22 ENCOUNTER — Encounter: Payer: Self-pay | Admitting: Physical Medicine and Rehabilitation

## 2021-06-22 ENCOUNTER — Ambulatory Visit (INDEPENDENT_AMBULATORY_CARE_PROVIDER_SITE_OTHER): Payer: Medicare Other | Admitting: Physical Medicine and Rehabilitation

## 2021-06-22 VITALS — BP 132/70 | HR 75

## 2021-06-22 DIAGNOSIS — H04123 Dry eye syndrome of bilateral lacrimal glands: Secondary | ICD-10-CM | POA: Diagnosis not present

## 2021-06-22 DIAGNOSIS — H02112 Cicatricial ectropion of right lower eyelid: Secondary | ICD-10-CM | POA: Diagnosis not present

## 2021-06-22 DIAGNOSIS — S81802A Unspecified open wound, left lower leg, initial encounter: Secondary | ICD-10-CM | POA: Diagnosis not present

## 2021-06-22 DIAGNOSIS — H16211 Exposure keratoconjunctivitis, right eye: Secondary | ICD-10-CM | POA: Diagnosis not present

## 2021-06-22 DIAGNOSIS — H16212 Exposure keratoconjunctivitis, left eye: Secondary | ICD-10-CM | POA: Diagnosis not present

## 2021-06-22 DIAGNOSIS — M5416 Radiculopathy, lumbar region: Secondary | ICD-10-CM | POA: Diagnosis not present

## 2021-06-22 DIAGNOSIS — H02132 Senile ectropion of right lower eyelid: Secondary | ICD-10-CM | POA: Diagnosis not present

## 2021-06-22 DIAGNOSIS — I739 Peripheral vascular disease, unspecified: Secondary | ICD-10-CM | POA: Diagnosis not present

## 2021-06-22 DIAGNOSIS — M48062 Spinal stenosis, lumbar region with neurogenic claudication: Secondary | ICD-10-CM | POA: Diagnosis not present

## 2021-06-22 DIAGNOSIS — S8412XA Injury of peroneal nerve at lower leg level, left leg, initial encounter: Secondary | ICD-10-CM | POA: Diagnosis not present

## 2021-06-22 DIAGNOSIS — H02535 Eyelid retraction left lower eyelid: Secondary | ICD-10-CM | POA: Diagnosis not present

## 2021-06-22 DIAGNOSIS — M4726 Other spondylosis with radiculopathy, lumbar region: Secondary | ICD-10-CM | POA: Diagnosis not present

## 2021-06-22 DIAGNOSIS — H02135 Senile ectropion of left lower eyelid: Secondary | ICD-10-CM | POA: Diagnosis not present

## 2021-06-22 DIAGNOSIS — H16213 Exposure keratoconjunctivitis, bilateral: Secondary | ICD-10-CM | POA: Diagnosis not present

## 2021-06-22 DIAGNOSIS — M47816 Spondylosis without myelopathy or radiculopathy, lumbar region: Secondary | ICD-10-CM | POA: Diagnosis not present

## 2021-06-22 DIAGNOSIS — H10022 Other mucopurulent conjunctivitis, left eye: Secondary | ICD-10-CM | POA: Diagnosis not present

## 2021-06-22 DIAGNOSIS — H02142 Spastic ectropion of right lower eyelid: Secondary | ICD-10-CM | POA: Diagnosis not present

## 2021-06-22 DIAGNOSIS — H02145 Spastic ectropion of left lower eyelid: Secondary | ICD-10-CM | POA: Diagnosis not present

## 2021-06-22 DIAGNOSIS — H02115 Cicatricial ectropion of left lower eyelid: Secondary | ICD-10-CM | POA: Diagnosis not present

## 2021-06-22 MED ORDER — DULOXETINE HCL 30 MG PO CPEP: ORAL_CAPSULE | ORAL | 3 refills | Status: DC

## 2021-06-22 NOTE — Progress Notes (Signed)
? ?Shelby Walker - 88 y.o. female MRN [id number]  Date of birth: 03-11-1933 ? ?Office Visit Note: ?Visit Date: 06/22/2021 ?PCP: Reynold Bowen, MD ?Referred by: Reynold Bowen, MD ? ?Subjective: ?Chief Complaint  ?Patient presents with  ? Lower Back - Pain  ? Left Hip - Pain  ? Left Thigh - Pain  ? Left Ankle - Pain  ? ?HPI: Shelby Walker is a 88 y.o. female who comes in today for evaluation of chronic, worsening and severe left sided lower back pain radiating to buttock and down lateral leg to foot. Patients daughter accompanying her during visit today. Most severe pain for her is left lateral knee down to foot. Patient reports pain has been ongoing for several months and is exacerbated by standing, walking and activity, she describes pain as constant sore and tingling sensation, currently rates as 8 out of 10. Patient reports some relief of pain with rest, heating pad and over the counter medications. Patient's recent lumbar MRI exhibits multi-level facet arthropathy, stairstep listhesis grade 1 of L3 on L4, L4 on L5 and L5 on S1, moderate-severe spinal stenosis and left paracentral disc protrusion at L3-L4 and severe spinal stenosis at L4-L5. Patient sustained mechanical fall in February and reports she landed on left knee, later developed swelling and hematoma. She did see Dr. Jean Rosenthal at that time who ordered CT of left knee that did exhibit moderate arthritic changes, negative for fracture. Patient has had multiple lumbar epidural steroid injections performed in our office over the last several months that have provided some short term relief of pain, most recent being left L3 transforaminal epidural steroid injection and reports good relief of pain for approximately 3 days. Patient is a poor historian and it is difficult for her to discern location of pain relief, however she does report most severe daily pain is localized to left lateral knee down to foot. Patient was recently see by Erskine Emery, PA  for evaluation of left lower leg wounds, was sent for left lower extremity doppler study that was negative for DVT. Daughter states she has follow for wound care consult this afternoon. Patient does use rolling walker to assist with ambulation and prevent falls. Patient denies focal weakness, she denies recent trauma or falls.  ? ?Review of Systems  ?Musculoskeletal:  Positive for back pain and falls.  ?Neurological:  Positive for tingling. Negative for focal weakness and weakness.  ?All other systems reviewed and are negative. Otherwise per HPI. ? ?Assessment & Plan: ?Visit Diagnoses:  ?  ICD-10-CM   ?1. Lumbar radiculopathy  M54.16   ?  ?2. Spinal stenosis of lumbar region with neurogenic claudication  M48.062   ?  ?3. Other spondylosis with radiculopathy, lumbar region  M47.26   ?  ?4. Facet arthropathy, lumbar  M47.816   ?  ?5. Injury of left peroneal nerve, initial encounter  S84.12XA   ?  ?   ?Plan: Findings:  ?Chronic, worsening and severe let sided lower back pain radiating to buttock and down lateral leg to foot. Patient continues to have pain despite good conservative therapies such as rest, use of heating and medications. Patients clinical presentation and exam are complex, differentials could include neurogenic claudication as a result of spinal canal stenosis, mechanical issue with left knee/leg and peroneal nerve injury. Patient has received multiple lumbar epidural steroid injections in our office over the last several months with minimal short term relief of pain. We do not feel repeating lumbar epidural steroid injection at  this time would be beneficial for patient. I did speak with patient and daughter about medication management and placed prescription for Cymbalta today. I would like to see patient in 4 weeks for re-evaluation. If she continues to have severe left lateral knee pain radiating down to feel we do feel evaluation by Dr. Ninfa Linden or Erskine Emery, PA would be beneficial. No red flag  symptoms noted upon exam today.   ? ?Meds & Orders:  ?Meds ordered this encounter  ?Medications  ? DULoxetine (CYMBALTA) 30 MG capsule  ?  Sig: Take 1 capsule (30 mg total) once a day by mouth for 2 weeks, then take 1 capsule (30 mg) twice a day.  ?  Dispense:  60 capsule  ?  Refill:  3  ?  Order Specific Question:   Supervising Provider  ?  AnswerMagnus Sinning [[id number]]  ? No orders of the defined types were placed in this encounter. ?  ?Follow-up: Return for 4 week follow up for re-evaluation.  ? ?Procedures: ?No procedures performed  ?   ? ?Clinical History: ?EXAM: ?MRI LUMBAR SPINE WITHOUT CONTRAST ?  ?TECHNIQUE: ?Multiplanar, multisequence MR imaging of the lumbar spine was ?performed. No intravenous contrast was administered. ?  ?COMPARISON:  RADIOGRAPHY OF THE LUMBAR DATED 11/16/2020. RADIOGRAPHY ?OF THE LUMBAR DATED 08/07/2018. ?  ?FINDINGS: ?Segmentation:  Standard. ?  ?Alignment: Grade 1 anterolisthesis of L3 on L4, L4 on L5 and L5-S1 ?secondary to facet disease. ?  ?Vertebrae: No acute fracture, evidence of discitis, or aggressive ?bone lesion. ?  ?Conus medullaris and cauda equina: Conus extends to the L1-2 level. ?Conus and cauda equina appear normal. ?  ?Paraspinal and other soft tissues: No acute paraspinal abnormality. ?Right renal cyst. ?  ?Disc levels: ?  ?Disc spaces: Degenerative disease with disc height loss at L3-4, ?L4-5 and L5-S1. ?  ?T12-L1: Minimal broad-based disc bulge. No foraminal or central ?canal stenosis. ?  ?L1-L2: No significant disc bulge. No neural foraminal stenosis. No ?central canal stenosis. ?  ?L2-L3: Minimal broad-based disc bulge. Mild bilateral facet ?arthropathy. No foraminal or central canal stenosis. ?  ?L3-L4: Broad-based disc bulge with a broad left paracentral disc ?protrusion with mass effect on the left intraspinal L4 nerve root. ?Moderate bilateral facet arthropathy. Moderate-severe spinal ?stenosis. Left subarticular recess stenosis. Moderate left  foraminal ?stenosis. Mild right foraminal stenosis. ?  ?L4-L5: Broad-based disc bulge with a broad central disc protrusion. ?Severe bilateral facet arthropathy with ligamentum flavum infolding. ?Severe spinal stenosis. Mild right and severe left foraminal ?stenosis. ?  ?L5-S1: Mild broad-based disc bulge with a broad shallow right ?paracentral disc protrusion with mass effect on the right ?intraspinal S1 nerve root. Mild bilateral foraminal stenosis. Mild ?bilateral facet arthropathy. No spinal stenosis. ?  ?IMPRESSION: ?1. Lumbar spine spondylosis as described above most severe at L3-4, ?L4-5 and L5-S1. ?2. No acute osseous injury of the lumbar spine. ?  ?  ?Electronically Signed ?  By: Kathreen Devoid M.D. ?  On: 11/23/2020 08:11  ? ?She reports that she has never smoked. She has never used smokeless tobacco.  ?Recent Labs  ?  01/18/21 ?2202  ?HGBA1C 7.9*  ? ? ?Objective:  VS:  HT:    WT:   BMI:     BP:132/70  HR:75bpm  TEMP: ( )  RESP:  ?Physical Exam ?Vitals and nursing note reviewed.  ?HENT:  ?   Head: Normocephalic and atraumatic.  ?   Right Ear: External ear normal.  ?  Left Ear: External ear normal.  ?   Nose: Nose normal.  ?   Mouth/Throat:  ?   Mouth: Mucous membranes are moist.  ?Eyes:  ?   Extraocular Movements: Extraocular movements intact.  ?Cardiovascular:  ?   Rate and Rhythm: Normal rate.  ?   Pulses: Normal pulses.  ?Pulmonary:  ?   Effort: Pulmonary effort is normal.  ?Abdominal:  ?   General: Abdomen is flat. There is no distension.  ?Musculoskeletal:     ?   General: Tenderness present.  ?   Cervical back: Normal range of motion.  ?   Left lower leg: 2+ Edema present.  ?   Comments: Pt is slow to rise from seated position to standing. Good lumbar range of motion. Strong distal strength without clonus, no pain upon palpation of greater trochanters. Sensation intact bilaterally. Left lower leg wounds wrapped with dressing, no drainage noted. Ambulates with rolling walker, gait is slow and  unsteady.  ?Skin: ?   General: Skin is warm and dry.  ?   Capillary Refill: Capillary refill takes less than 2 seconds.  ?Neurological:  ?   Mental Status: She is alert and oriented to person, place, and time.  ?   Gait:

## 2021-06-22 NOTE — Progress Notes (Signed)
Pt has hx of hip inj on 06/19/21 pt state it helped sum. Pt state she still has sum pain from her left hip to her ankle. Pt state she has pain in her lower back. Pt state walking and standing makes the pain worse. Pt state takes pain meds to help ease her pain. ? ?Numeric Pain Rating Scale and Functional Assessment ?Average Pain 9 ?Pain Right Now 7 ?My pain is intermittent, sharp, and aching ?Pain is worse with: walking, standing, and some activites ?Pain improves with: medication ? ? ?In the last MONTH (on 0-10 scale) has pain interfered with the following? ? ?1. General activity like being  able to carry out your everyday physical activities such as walking, climbing stairs, carrying groceries, or moving a chair?  ?Rating(5) ? ?2. Relation with others like being able to carry out your usual social activities and roles such as  activities at home, at work and in your community. ?Rating(6) ? ?3. Enjoyment of life such that you have  been bothered by emotional problems such as feeling anxious, depressed or irritable?  ?Rating(7) ? ?

## 2021-06-23 ENCOUNTER — Telehealth: Payer: Self-pay | Admitting: Physical Medicine and Rehabilitation

## 2021-06-23 DIAGNOSIS — I129 Hypertensive chronic kidney disease with stage 1 through stage 4 chronic kidney disease, or unspecified chronic kidney disease: Secondary | ICD-10-CM | POA: Diagnosis not present

## 2021-06-23 DIAGNOSIS — E1122 Type 2 diabetes mellitus with diabetic chronic kidney disease: Secondary | ICD-10-CM | POA: Diagnosis not present

## 2021-06-23 DIAGNOSIS — E1151 Type 2 diabetes mellitus with diabetic peripheral angiopathy without gangrene: Secondary | ICD-10-CM | POA: Diagnosis not present

## 2021-06-23 DIAGNOSIS — E11628 Type 2 diabetes mellitus with other skin complications: Secondary | ICD-10-CM | POA: Diagnosis not present

## 2021-06-23 DIAGNOSIS — N189 Chronic kidney disease, unspecified: Secondary | ICD-10-CM | POA: Diagnosis not present

## 2021-06-23 DIAGNOSIS — Z4781 Encounter for orthopedic aftercare following surgical amputation: Secondary | ICD-10-CM | POA: Diagnosis not present

## 2021-06-23 NOTE — Telephone Encounter (Signed)
Patient's daughter called she has some questions about her medication.  ?

## 2021-06-26 DIAGNOSIS — N189 Chronic kidney disease, unspecified: Secondary | ICD-10-CM | POA: Diagnosis not present

## 2021-06-26 DIAGNOSIS — E1122 Type 2 diabetes mellitus with diabetic chronic kidney disease: Secondary | ICD-10-CM | POA: Diagnosis not present

## 2021-06-26 DIAGNOSIS — E1151 Type 2 diabetes mellitus with diabetic peripheral angiopathy without gangrene: Secondary | ICD-10-CM | POA: Diagnosis not present

## 2021-06-26 DIAGNOSIS — E11628 Type 2 diabetes mellitus with other skin complications: Secondary | ICD-10-CM | POA: Diagnosis not present

## 2021-06-26 DIAGNOSIS — I129 Hypertensive chronic kidney disease with stage 1 through stage 4 chronic kidney disease, or unspecified chronic kidney disease: Secondary | ICD-10-CM | POA: Diagnosis not present

## 2021-06-26 DIAGNOSIS — Z4781 Encounter for orthopedic aftercare following surgical amputation: Secondary | ICD-10-CM | POA: Diagnosis not present

## 2021-06-26 NOTE — Telephone Encounter (Signed)
[name] called in for mother Shelby Walker stating the Cymbalta is giving her severe diarrhea and she cannot take it any longer and was wondering if she could get something different as soon as possible. ?

## 2021-06-27 DIAGNOSIS — E1122 Type 2 diabetes mellitus with diabetic chronic kidney disease: Secondary | ICD-10-CM | POA: Diagnosis not present

## 2021-06-27 DIAGNOSIS — E11628 Type 2 diabetes mellitus with other skin complications: Secondary | ICD-10-CM | POA: Diagnosis not present

## 2021-06-27 DIAGNOSIS — N189 Chronic kidney disease, unspecified: Secondary | ICD-10-CM | POA: Diagnosis not present

## 2021-06-27 DIAGNOSIS — Z4781 Encounter for orthopedic aftercare following surgical amputation: Secondary | ICD-10-CM | POA: Diagnosis not present

## 2021-06-27 DIAGNOSIS — I129 Hypertensive chronic kidney disease with stage 1 through stage 4 chronic kidney disease, or unspecified chronic kidney disease: Secondary | ICD-10-CM | POA: Diagnosis not present

## 2021-06-27 DIAGNOSIS — E1151 Type 2 diabetes mellitus with diabetic peripheral angiopathy without gangrene: Secondary | ICD-10-CM | POA: Diagnosis not present

## 2021-06-30 DIAGNOSIS — I129 Hypertensive chronic kidney disease with stage 1 through stage 4 chronic kidney disease, or unspecified chronic kidney disease: Secondary | ICD-10-CM | POA: Diagnosis not present

## 2021-06-30 DIAGNOSIS — E11628 Type 2 diabetes mellitus with other skin complications: Secondary | ICD-10-CM | POA: Diagnosis not present

## 2021-06-30 DIAGNOSIS — Z4781 Encounter for orthopedic aftercare following surgical amputation: Secondary | ICD-10-CM | POA: Diagnosis not present

## 2021-06-30 DIAGNOSIS — E1122 Type 2 diabetes mellitus with diabetic chronic kidney disease: Secondary | ICD-10-CM | POA: Diagnosis not present

## 2021-06-30 DIAGNOSIS — N189 Chronic kidney disease, unspecified: Secondary | ICD-10-CM | POA: Diagnosis not present

## 2021-06-30 DIAGNOSIS — E1151 Type 2 diabetes mellitus with diabetic peripheral angiopathy without gangrene: Secondary | ICD-10-CM | POA: Diagnosis not present

## 2021-07-04 ENCOUNTER — Telehealth: Payer: Self-pay | Admitting: Physical Medicine and Rehabilitation

## 2021-07-04 DIAGNOSIS — Z1152 Encounter for screening for COVID-19: Secondary | ICD-10-CM | POA: Diagnosis not present

## 2021-07-04 DIAGNOSIS — M069 Rheumatoid arthritis, unspecified: Secondary | ICD-10-CM | POA: Diagnosis not present

## 2021-07-04 DIAGNOSIS — R051 Acute cough: Secondary | ICD-10-CM | POA: Diagnosis not present

## 2021-07-04 DIAGNOSIS — E1140 Type 2 diabetes mellitus with diabetic neuropathy, unspecified: Secondary | ICD-10-CM | POA: Diagnosis not present

## 2021-07-04 NOTE — Telephone Encounter (Signed)
[name] called in for mother Shelby Walker stating the Cymbalta is giving her severe diarrhea and she cannot take it any longer and was wondering if she could get something different as soon as possible. ? ?+++ the patients daughter called in about this problem has not heard back yet and has appointment on 05/15 '@9'$ :45 to see how medication is working but it is not long taken because of side effects+++ ?

## 2021-07-05 DIAGNOSIS — N189 Chronic kidney disease, unspecified: Secondary | ICD-10-CM | POA: Diagnosis not present

## 2021-07-05 DIAGNOSIS — E1151 Type 2 diabetes mellitus with diabetic peripheral angiopathy without gangrene: Secondary | ICD-10-CM | POA: Diagnosis not present

## 2021-07-05 DIAGNOSIS — Z4781 Encounter for orthopedic aftercare following surgical amputation: Secondary | ICD-10-CM | POA: Diagnosis not present

## 2021-07-05 DIAGNOSIS — I129 Hypertensive chronic kidney disease with stage 1 through stage 4 chronic kidney disease, or unspecified chronic kidney disease: Secondary | ICD-10-CM | POA: Diagnosis not present

## 2021-07-05 DIAGNOSIS — E11628 Type 2 diabetes mellitus with other skin complications: Secondary | ICD-10-CM | POA: Diagnosis not present

## 2021-07-05 DIAGNOSIS — E1122 Type 2 diabetes mellitus with diabetic chronic kidney disease: Secondary | ICD-10-CM | POA: Diagnosis not present

## 2021-07-06 DIAGNOSIS — H16211 Exposure keratoconjunctivitis, right eye: Secondary | ICD-10-CM | POA: Diagnosis not present

## 2021-07-06 DIAGNOSIS — N189 Chronic kidney disease, unspecified: Secondary | ICD-10-CM | POA: Diagnosis not present

## 2021-07-06 DIAGNOSIS — H02142 Spastic ectropion of right lower eyelid: Secondary | ICD-10-CM | POA: Diagnosis not present

## 2021-07-06 DIAGNOSIS — H10022 Other mucopurulent conjunctivitis, left eye: Secondary | ICD-10-CM | POA: Diagnosis not present

## 2021-07-06 DIAGNOSIS — H04123 Dry eye syndrome of bilateral lacrimal glands: Secondary | ICD-10-CM | POA: Diagnosis not present

## 2021-07-06 DIAGNOSIS — E1122 Type 2 diabetes mellitus with diabetic chronic kidney disease: Secondary | ICD-10-CM | POA: Diagnosis not present

## 2021-07-06 DIAGNOSIS — H02135 Senile ectropion of left lower eyelid: Secondary | ICD-10-CM | POA: Diagnosis not present

## 2021-07-06 DIAGNOSIS — H02115 Cicatricial ectropion of left lower eyelid: Secondary | ICD-10-CM | POA: Diagnosis not present

## 2021-07-06 DIAGNOSIS — I129 Hypertensive chronic kidney disease with stage 1 through stage 4 chronic kidney disease, or unspecified chronic kidney disease: Secondary | ICD-10-CM | POA: Diagnosis not present

## 2021-07-06 DIAGNOSIS — E11628 Type 2 diabetes mellitus with other skin complications: Secondary | ICD-10-CM | POA: Diagnosis not present

## 2021-07-06 DIAGNOSIS — Z4781 Encounter for orthopedic aftercare following surgical amputation: Secondary | ICD-10-CM | POA: Diagnosis not present

## 2021-07-06 DIAGNOSIS — H02145 Spastic ectropion of left lower eyelid: Secondary | ICD-10-CM | POA: Diagnosis not present

## 2021-07-06 DIAGNOSIS — H02132 Senile ectropion of right lower eyelid: Secondary | ICD-10-CM | POA: Diagnosis not present

## 2021-07-06 DIAGNOSIS — H02112 Cicatricial ectropion of right lower eyelid: Secondary | ICD-10-CM | POA: Diagnosis not present

## 2021-07-06 DIAGNOSIS — E1151 Type 2 diabetes mellitus with diabetic peripheral angiopathy without gangrene: Secondary | ICD-10-CM | POA: Diagnosis not present

## 2021-07-06 DIAGNOSIS — H04521 Eversion of right lacrimal punctum: Secondary | ICD-10-CM | POA: Diagnosis not present

## 2021-07-06 DIAGNOSIS — H16213 Exposure keratoconjunctivitis, bilateral: Secondary | ICD-10-CM | POA: Diagnosis not present

## 2021-07-06 DIAGNOSIS — H16212 Exposure keratoconjunctivitis, left eye: Secondary | ICD-10-CM | POA: Diagnosis not present

## 2021-07-07 DIAGNOSIS — E11628 Type 2 diabetes mellitus with other skin complications: Secondary | ICD-10-CM | POA: Diagnosis not present

## 2021-07-07 DIAGNOSIS — N189 Chronic kidney disease, unspecified: Secondary | ICD-10-CM | POA: Diagnosis not present

## 2021-07-07 DIAGNOSIS — E1151 Type 2 diabetes mellitus with diabetic peripheral angiopathy without gangrene: Secondary | ICD-10-CM | POA: Diagnosis not present

## 2021-07-07 DIAGNOSIS — I129 Hypertensive chronic kidney disease with stage 1 through stage 4 chronic kidney disease, or unspecified chronic kidney disease: Secondary | ICD-10-CM | POA: Diagnosis not present

## 2021-07-07 DIAGNOSIS — E1122 Type 2 diabetes mellitus with diabetic chronic kidney disease: Secondary | ICD-10-CM | POA: Diagnosis not present

## 2021-07-07 DIAGNOSIS — Z4781 Encounter for orthopedic aftercare following surgical amputation: Secondary | ICD-10-CM | POA: Diagnosis not present

## 2021-07-09 DIAGNOSIS — M069 Rheumatoid arthritis, unspecified: Secondary | ICD-10-CM | POA: Diagnosis not present

## 2021-07-09 DIAGNOSIS — E785 Hyperlipidemia, unspecified: Secondary | ICD-10-CM | POA: Diagnosis not present

## 2021-07-09 DIAGNOSIS — Z794 Long term (current) use of insulin: Secondary | ICD-10-CM | POA: Diagnosis not present

## 2021-07-09 DIAGNOSIS — Z7901 Long term (current) use of anticoagulants: Secondary | ICD-10-CM | POA: Diagnosis not present

## 2021-07-09 DIAGNOSIS — K5790 Diverticulosis of intestine, part unspecified, without perforation or abscess without bleeding: Secondary | ICD-10-CM | POA: Diagnosis not present

## 2021-07-09 DIAGNOSIS — Z7952 Long term (current) use of systemic steroids: Secondary | ICD-10-CM | POA: Diagnosis not present

## 2021-07-09 DIAGNOSIS — Z79891 Long term (current) use of opiate analgesic: Secondary | ICD-10-CM | POA: Diagnosis not present

## 2021-07-09 DIAGNOSIS — E1151 Type 2 diabetes mellitus with diabetic peripheral angiopathy without gangrene: Secondary | ICD-10-CM | POA: Diagnosis not present

## 2021-07-09 DIAGNOSIS — I051 Rheumatic mitral insufficiency: Secondary | ICD-10-CM | POA: Diagnosis not present

## 2021-07-09 DIAGNOSIS — Z791 Long term (current) use of non-steroidal anti-inflammatories (NSAID): Secondary | ICD-10-CM | POA: Diagnosis not present

## 2021-07-09 DIAGNOSIS — E46 Unspecified protein-calorie malnutrition: Secondary | ICD-10-CM | POA: Diagnosis not present

## 2021-07-09 DIAGNOSIS — Z89431 Acquired absence of right foot: Secondary | ICD-10-CM | POA: Diagnosis not present

## 2021-07-09 DIAGNOSIS — N189 Chronic kidney disease, unspecified: Secondary | ICD-10-CM | POA: Diagnosis not present

## 2021-07-09 DIAGNOSIS — E1142 Type 2 diabetes mellitus with diabetic polyneuropathy: Secondary | ICD-10-CM | POA: Diagnosis not present

## 2021-07-09 DIAGNOSIS — K219 Gastro-esophageal reflux disease without esophagitis: Secondary | ICD-10-CM | POA: Diagnosis not present

## 2021-07-09 DIAGNOSIS — Z8631 Personal history of diabetic foot ulcer: Secondary | ICD-10-CM | POA: Diagnosis not present

## 2021-07-09 DIAGNOSIS — I6529 Occlusion and stenosis of unspecified carotid artery: Secondary | ICD-10-CM | POA: Diagnosis not present

## 2021-07-09 DIAGNOSIS — I4891 Unspecified atrial fibrillation: Secondary | ICD-10-CM | POA: Diagnosis not present

## 2021-07-09 DIAGNOSIS — Z9181 History of falling: Secondary | ICD-10-CM | POA: Diagnosis not present

## 2021-07-09 DIAGNOSIS — I129 Hypertensive chronic kidney disease with stage 1 through stage 4 chronic kidney disease, or unspecified chronic kidney disease: Secondary | ICD-10-CM | POA: Diagnosis not present

## 2021-07-09 DIAGNOSIS — Z4781 Encounter for orthopedic aftercare following surgical amputation: Secondary | ICD-10-CM | POA: Diagnosis not present

## 2021-07-09 DIAGNOSIS — Z20822 Contact with and (suspected) exposure to covid-19: Secondary | ICD-10-CM | POA: Diagnosis not present

## 2021-07-09 DIAGNOSIS — E11628 Type 2 diabetes mellitus with other skin complications: Secondary | ICD-10-CM | POA: Diagnosis not present

## 2021-07-09 DIAGNOSIS — M810 Age-related osteoporosis without current pathological fracture: Secondary | ICD-10-CM | POA: Diagnosis not present

## 2021-07-09 DIAGNOSIS — E1122 Type 2 diabetes mellitus with diabetic chronic kidney disease: Secondary | ICD-10-CM | POA: Diagnosis not present

## 2021-07-10 DIAGNOSIS — Z20822 Contact with and (suspected) exposure to covid-19: Secondary | ICD-10-CM | POA: Diagnosis not present

## 2021-07-12 DIAGNOSIS — D649 Anemia, unspecified: Secondary | ICD-10-CM | POA: Diagnosis not present

## 2021-07-12 DIAGNOSIS — I6523 Occlusion and stenosis of bilateral carotid arteries: Secondary | ICD-10-CM | POA: Diagnosis not present

## 2021-07-12 DIAGNOSIS — M5416 Radiculopathy, lumbar region: Secondary | ICD-10-CM | POA: Diagnosis not present

## 2021-07-12 DIAGNOSIS — N1832 Chronic kidney disease, stage 3b: Secondary | ICD-10-CM | POA: Diagnosis not present

## 2021-07-12 DIAGNOSIS — M069 Rheumatoid arthritis, unspecified: Secondary | ICD-10-CM | POA: Diagnosis not present

## 2021-07-12 DIAGNOSIS — I130 Hypertensive heart and chronic kidney disease with heart failure and stage 1 through stage 4 chronic kidney disease, or unspecified chronic kidney disease: Secondary | ICD-10-CM | POA: Diagnosis not present

## 2021-07-12 DIAGNOSIS — I739 Peripheral vascular disease, unspecified: Secondary | ICD-10-CM | POA: Diagnosis not present

## 2021-07-12 DIAGNOSIS — E1140 Type 2 diabetes mellitus with diabetic neuropathy, unspecified: Secondary | ICD-10-CM | POA: Diagnosis not present

## 2021-07-12 DIAGNOSIS — E785 Hyperlipidemia, unspecified: Secondary | ICD-10-CM | POA: Diagnosis not present

## 2021-07-12 DIAGNOSIS — M810 Age-related osteoporosis without current pathological fracture: Secondary | ICD-10-CM | POA: Diagnosis not present

## 2021-07-12 DIAGNOSIS — I129 Hypertensive chronic kidney disease with stage 1 through stage 4 chronic kidney disease, or unspecified chronic kidney disease: Secondary | ICD-10-CM | POA: Diagnosis not present

## 2021-07-12 DIAGNOSIS — I679 Cerebrovascular disease, unspecified: Secondary | ICD-10-CM | POA: Diagnosis not present

## 2021-07-13 ENCOUNTER — Ambulatory Visit (INDEPENDENT_AMBULATORY_CARE_PROVIDER_SITE_OTHER): Payer: Medicare Other

## 2021-07-13 DIAGNOSIS — I129 Hypertensive chronic kidney disease with stage 1 through stage 4 chronic kidney disease, or unspecified chronic kidney disease: Secondary | ICD-10-CM | POA: Diagnosis not present

## 2021-07-13 DIAGNOSIS — E1122 Type 2 diabetes mellitus with diabetic chronic kidney disease: Secondary | ICD-10-CM | POA: Diagnosis not present

## 2021-07-13 DIAGNOSIS — E11628 Type 2 diabetes mellitus with other skin complications: Secondary | ICD-10-CM | POA: Diagnosis not present

## 2021-07-13 DIAGNOSIS — N189 Chronic kidney disease, unspecified: Secondary | ICD-10-CM | POA: Diagnosis not present

## 2021-07-13 DIAGNOSIS — Z4781 Encounter for orthopedic aftercare following surgical amputation: Secondary | ICD-10-CM | POA: Diagnosis not present

## 2021-07-13 DIAGNOSIS — I480 Paroxysmal atrial fibrillation: Secondary | ICD-10-CM

## 2021-07-13 DIAGNOSIS — E1151 Type 2 diabetes mellitus with diabetic peripheral angiopathy without gangrene: Secondary | ICD-10-CM | POA: Diagnosis not present

## 2021-07-13 LAB — CUP PACEART REMOTE DEVICE CHECK
Date Time Interrogation Session: 20230511000907
Implantable Pulse Generator Implant Date: 20230403

## 2021-07-14 DIAGNOSIS — M5136 Other intervertebral disc degeneration, lumbar region: Secondary | ICD-10-CM | POA: Diagnosis not present

## 2021-07-14 DIAGNOSIS — M7062 Trochanteric bursitis, left hip: Secondary | ICD-10-CM | POA: Diagnosis not present

## 2021-07-14 DIAGNOSIS — M069 Rheumatoid arthritis, unspecified: Secondary | ICD-10-CM | POA: Diagnosis not present

## 2021-07-14 DIAGNOSIS — Z6821 Body mass index (BMI) 21.0-21.9, adult: Secondary | ICD-10-CM | POA: Diagnosis not present

## 2021-07-14 DIAGNOSIS — L409 Psoriasis, unspecified: Secondary | ICD-10-CM | POA: Diagnosis not present

## 2021-07-14 DIAGNOSIS — Z79899 Other long term (current) drug therapy: Secondary | ICD-10-CM | POA: Diagnosis not present

## 2021-07-14 DIAGNOSIS — M1991 Primary osteoarthritis, unspecified site: Secondary | ICD-10-CM | POA: Diagnosis not present

## 2021-07-14 DIAGNOSIS — M810 Age-related osteoporosis without current pathological fracture: Secondary | ICD-10-CM | POA: Diagnosis not present

## 2021-07-14 DIAGNOSIS — M25562 Pain in left knee: Secondary | ICD-10-CM | POA: Diagnosis not present

## 2021-07-17 ENCOUNTER — Ambulatory Visit: Payer: Medicare Other | Admitting: Physical Medicine and Rehabilitation

## 2021-07-17 DIAGNOSIS — H43391 Other vitreous opacities, right eye: Secondary | ICD-10-CM | POA: Diagnosis not present

## 2021-07-17 DIAGNOSIS — E113293 Type 2 diabetes mellitus with mild nonproliferative diabetic retinopathy without macular edema, bilateral: Secondary | ICD-10-CM | POA: Diagnosis not present

## 2021-07-17 DIAGNOSIS — H43813 Vitreous degeneration, bilateral: Secondary | ICD-10-CM | POA: Diagnosis not present

## 2021-07-17 DIAGNOSIS — H35433 Paving stone degeneration of retina, bilateral: Secondary | ICD-10-CM | POA: Diagnosis not present

## 2021-07-20 DIAGNOSIS — I129 Hypertensive chronic kidney disease with stage 1 through stage 4 chronic kidney disease, or unspecified chronic kidney disease: Secondary | ICD-10-CM | POA: Diagnosis not present

## 2021-07-20 DIAGNOSIS — Z4781 Encounter for orthopedic aftercare following surgical amputation: Secondary | ICD-10-CM | POA: Diagnosis not present

## 2021-07-20 DIAGNOSIS — E1151 Type 2 diabetes mellitus with diabetic peripheral angiopathy without gangrene: Secondary | ICD-10-CM | POA: Diagnosis not present

## 2021-07-20 DIAGNOSIS — E11628 Type 2 diabetes mellitus with other skin complications: Secondary | ICD-10-CM | POA: Diagnosis not present

## 2021-07-20 DIAGNOSIS — E1122 Type 2 diabetes mellitus with diabetic chronic kidney disease: Secondary | ICD-10-CM | POA: Diagnosis not present

## 2021-07-20 DIAGNOSIS — N189 Chronic kidney disease, unspecified: Secondary | ICD-10-CM | POA: Diagnosis not present

## 2021-07-20 NOTE — Progress Notes (Signed)
Carelink Summary Report / Loop Recorder 

## 2021-07-24 ENCOUNTER — Ambulatory Visit: Payer: Medicare Other | Admitting: Physician Assistant

## 2021-07-25 DIAGNOSIS — Z4781 Encounter for orthopedic aftercare following surgical amputation: Secondary | ICD-10-CM | POA: Diagnosis not present

## 2021-07-25 DIAGNOSIS — I129 Hypertensive chronic kidney disease with stage 1 through stage 4 chronic kidney disease, or unspecified chronic kidney disease: Secondary | ICD-10-CM | POA: Diagnosis not present

## 2021-07-25 DIAGNOSIS — E1122 Type 2 diabetes mellitus with diabetic chronic kidney disease: Secondary | ICD-10-CM | POA: Diagnosis not present

## 2021-07-25 DIAGNOSIS — E11628 Type 2 diabetes mellitus with other skin complications: Secondary | ICD-10-CM | POA: Diagnosis not present

## 2021-07-25 DIAGNOSIS — E1151 Type 2 diabetes mellitus with diabetic peripheral angiopathy without gangrene: Secondary | ICD-10-CM | POA: Diagnosis not present

## 2021-07-25 DIAGNOSIS — N189 Chronic kidney disease, unspecified: Secondary | ICD-10-CM | POA: Diagnosis not present

## 2021-07-26 ENCOUNTER — Ambulatory Visit (INDEPENDENT_AMBULATORY_CARE_PROVIDER_SITE_OTHER): Payer: Medicare Other

## 2021-07-26 ENCOUNTER — Encounter: Payer: Self-pay | Admitting: Physician Assistant

## 2021-07-26 ENCOUNTER — Ambulatory Visit (INDEPENDENT_AMBULATORY_CARE_PROVIDER_SITE_OTHER): Payer: Medicare Other | Admitting: Physician Assistant

## 2021-07-26 VITALS — Ht 61.5 in | Wt 120.0 lb

## 2021-07-26 DIAGNOSIS — M25562 Pain in left knee: Secondary | ICD-10-CM

## 2021-07-26 DIAGNOSIS — G8929 Other chronic pain: Secondary | ICD-10-CM

## 2021-07-26 NOTE — Progress Notes (Signed)
Office Visit Note   Patient: Shelby Walker           Date of Birth: 12-Aug-1933           MRN: [id number] Visit Date: 07/26/2021              Requested by: Reynold Bowen, MD [address] [place],  [place] [address] PCP: Reynold Bowen, MD   Assessment & Plan: Visit Diagnoses:  1. Chronic pain of left knee     Plan: Dr. Ninfa Linden and I spoke with patient and her son who is present today through the examination.  After talking with the patient and examined her this point time we will give the needed more time due to the acute injury she had back in early February which resulted in prepatellar bursitis and hematoma.  They were happy with this plan.  Follow-Up Instructions: Return in about 3 months (around 10/26/2021).   Orders:  Orders Placed This Encounter  Procedures   XR Knee 1-2 Views Left   No orders of the defined types were placed in this encounter.     Procedures: No procedures performed   Clinical Data: No additional findings.   Subjective: Chief Complaint  Patient presents with   Left Knee - Pain, Follow-up    HPI Shelby Walker returns today for left knee pain.  She states the aspiration and injection of the knee on 06/19/2021 helped but did not last and she has swelling in the knee again.  Also pain she describes a deep within the knee.  She has had no new injuries.  Review of Systems See HPI.  Objective: Vital Signs: Ht 5' 1.5" (1.562 m)   Wt 120 lb (54.4 kg)   BMI 22.31 kg/m   Physical Exam General well-developed well-nourished female walks with a slight antalgic gait with no assistive device.  Mood affect appropriate Ortho Exam Left knee full extension full flexion no tenderness along medial lateral joint line.  McMurray's is negative.  Has tenderness over the lateral proximal fibular region.  Positive effusion no abnormal warmth erythema.  Radiographs: Left knee 2 views: No acute fractures.  Patellofemoral arthritic changes slight narrowing medial  joint line.  Otherwise no acute fractures.  Radiographs virtually unchanged from prior films 11/16/2020.  PMFS History: Patient Active Problem List   Diagnosis Date Noted   Acute viral bronchitis 02/06/2021   Acute blood loss anemia    Postoperative pain    S/P transmetatarsal amputation of foot, right ([place]) 01/24/2021   Protein-calorie malnutrition, severe 01/17/2021   Diabetic foot infection ([place]) 01/16/2021   Rheumatoid arthritis ([place]) 01/16/2021   Stage 3b chronic kidney disease (CKD) ([place]) 01/16/2021   DNR (do not resuscitate) 01/16/2021   Decreased anal sphincter tone 12/13/2018   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   Irritable bowel syndrome with diarrhea - post infectious 12/12/2018   Full incontinence of feces and fecal smearing 12/12/2018   Perianal dermatitis 12/12/2018   Uncontrolled type 2 diabetes mellitus with hyperglycemia ([place]) 10/26/2018   Long-term use of immunosuppressant medication-Enbrel 10/26/2018   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 08/27/2018   Thrombocytopenia (H[facility])    Essential hypertension    Dyspnea on exertion    Steroid-induced hyperglycemia    Fatigue 08/06/2018   Paroxysmal atrial fibrillation ([facility])    Mixed hyperlipidemia 09/29/2016   Stable angina ([place]) 09/29/2016   PAD (peripheral artery disease) ([place]) 08/09/2016   Preoperative clearance 05/21/2012   Precordial chest pain    Palpitations    Diabetes  mellitus ([place])    GERD (gastroesophageal reflux disease)    Ejection fraction    Carotid artery disease ([place])    Mitral regurgitation 04/21/2009   PSORIASIS 02/09/2009   Rheumatoid arthritis flare ([place]) 02/09/2009   Past Medical History:  Diagnosis Date   Bruit    Carotid Doppler showed no significant abnormality     9per patient)   C. difficile colitis 07/2018   with severe sepsis   Chest pain, unspecified    Nuclear, May, 2008, no scar or ischemia   Diabetes mellitus    Diverticulosis    Dyslipidemia    Ejection fraction     EF 55-60%, echo, February, 2011 // Echocardiogram 8/21: EF 60-65, no RWMA, Gr 1 DD, GLS -14%, normal RVSF, mild LAE, trivial MR, mild MS (mean 4 mmHg), RVSP 23.4    GERD (gastroesophageal reflux disease)    Mitral regurgitation April 21, 2009   mild,  echo, February, 2011   Osteoporosis    Palpitations    possible very brief atrial fibrillation on monitor and possible reentrant tachycardia   Psoriasis    Rheumatoid arthritis ([place])     Family History  Problem Relation Age of Onset   Heart failure Father    Heart attack Father    Diabetes Father    Heart attack Mother    Heart failure Mother    Diabetes Mother    Stroke Sister     Past Surgical History:  Procedure Laterality Date   ABDOMINAL AORTOGRAM W/LOWER EXTREMITY Right 10/19/2020   Procedure: ABDOMINAL AORTOGRAM W/LOWER EXTREMITY;  Surgeon: Wellington Hampshire, MD;  Location: [facility];  Service: Cardiovascular;  Laterality: Right;   FRACTURE SURGERY     TRANSMETATARSAL AMPUTATION Right 01/18/2021   Procedure: TRANSMETATARSAL AMPUTATION;  Surgeon: Wylene Simmer, MD;  Location: [facility];  Service: Orthopedics;  Laterality: Right;   Social History   Occupational History    Employer: RETIRED  Tobacco Use   Smoking status: Never   Smokeless tobacco: Never  Vaping Use   Vaping Use: Never used  Substance and Sexual Activity   Alcohol use: No    Alcohol/week: 0.0 standard drinks   Drug use: No   Sexual activity: Yes    Birth control/protection: Post-menopausal

## 2021-08-03 DIAGNOSIS — I129 Hypertensive chronic kidney disease with stage 1 through stage 4 chronic kidney disease, or unspecified chronic kidney disease: Secondary | ICD-10-CM | POA: Diagnosis not present

## 2021-08-03 DIAGNOSIS — E1122 Type 2 diabetes mellitus with diabetic chronic kidney disease: Secondary | ICD-10-CM | POA: Diagnosis not present

## 2021-08-03 DIAGNOSIS — M069 Rheumatoid arthritis, unspecified: Secondary | ICD-10-CM | POA: Diagnosis not present

## 2021-08-03 DIAGNOSIS — E1151 Type 2 diabetes mellitus with diabetic peripheral angiopathy without gangrene: Secondary | ICD-10-CM | POA: Diagnosis not present

## 2021-08-03 DIAGNOSIS — Z4781 Encounter for orthopedic aftercare following surgical amputation: Secondary | ICD-10-CM | POA: Diagnosis not present

## 2021-08-03 DIAGNOSIS — E11628 Type 2 diabetes mellitus with other skin complications: Secondary | ICD-10-CM | POA: Diagnosis not present

## 2021-08-03 DIAGNOSIS — N189 Chronic kidney disease, unspecified: Secondary | ICD-10-CM | POA: Diagnosis not present

## 2021-08-07 DIAGNOSIS — H02135 Senile ectropion of left lower eyelid: Secondary | ICD-10-CM | POA: Diagnosis not present

## 2021-08-07 DIAGNOSIS — H02115 Cicatricial ectropion of left lower eyelid: Secondary | ICD-10-CM | POA: Diagnosis not present

## 2021-08-07 DIAGNOSIS — H04562 Stenosis of left lacrimal punctum: Secondary | ICD-10-CM | POA: Diagnosis not present

## 2021-08-07 DIAGNOSIS — H02142 Spastic ectropion of right lower eyelid: Secondary | ICD-10-CM | POA: Diagnosis not present

## 2021-08-07 DIAGNOSIS — H02532 Eyelid retraction right lower eyelid: Secondary | ICD-10-CM | POA: Diagnosis not present

## 2021-08-07 DIAGNOSIS — H04123 Dry eye syndrome of bilateral lacrimal glands: Secondary | ICD-10-CM | POA: Diagnosis not present

## 2021-08-07 DIAGNOSIS — H16212 Exposure keratoconjunctivitis, left eye: Secondary | ICD-10-CM | POA: Diagnosis not present

## 2021-08-07 DIAGNOSIS — H02132 Senile ectropion of right lower eyelid: Secondary | ICD-10-CM | POA: Diagnosis not present

## 2021-08-07 DIAGNOSIS — H10022 Other mucopurulent conjunctivitis, left eye: Secondary | ICD-10-CM | POA: Diagnosis not present

## 2021-08-07 DIAGNOSIS — H02112 Cicatricial ectropion of right lower eyelid: Secondary | ICD-10-CM | POA: Diagnosis not present

## 2021-08-07 DIAGNOSIS — H02125 Mechanical ectropion of left lower eyelid: Secondary | ICD-10-CM | POA: Diagnosis not present

## 2021-08-07 DIAGNOSIS — H04522 Eversion of left lacrimal punctum: Secondary | ICD-10-CM | POA: Diagnosis not present

## 2021-08-07 DIAGNOSIS — H02535 Eyelid retraction left lower eyelid: Secondary | ICD-10-CM | POA: Diagnosis not present

## 2021-08-07 DIAGNOSIS — H11822 Conjunctivochalasis, left eye: Secondary | ICD-10-CM | POA: Diagnosis not present

## 2021-08-07 DIAGNOSIS — H02145 Spastic ectropion of left lower eyelid: Secondary | ICD-10-CM | POA: Diagnosis not present

## 2021-08-14 DIAGNOSIS — Z09 Encounter for follow-up examination after completed treatment for conditions other than malignant neoplasm: Secondary | ICD-10-CM | POA: Diagnosis not present

## 2021-08-14 DIAGNOSIS — H02055 Trichiasis without entropian left lower eyelid: Secondary | ICD-10-CM | POA: Diagnosis not present

## 2021-08-15 ENCOUNTER — Ambulatory Visit (INDEPENDENT_AMBULATORY_CARE_PROVIDER_SITE_OTHER): Payer: Medicare Other

## 2021-08-15 DIAGNOSIS — I480 Paroxysmal atrial fibrillation: Secondary | ICD-10-CM

## 2021-08-15 LAB — CUP PACEART REMOTE DEVICE CHECK
Date Time Interrogation Session: 20230613000506
Implantable Pulse Generator Implant Date: 20230403

## 2021-08-25 ENCOUNTER — Other Ambulatory Visit: Payer: Self-pay | Admitting: Cardiovascular Disease

## 2021-08-25 DIAGNOSIS — I739 Peripheral vascular disease, unspecified: Secondary | ICD-10-CM

## 2021-08-25 DIAGNOSIS — E782 Mixed hyperlipidemia: Secondary | ICD-10-CM

## 2021-08-30 NOTE — Progress Notes (Signed)
Carelink Summary Report / Loop Recorder 

## 2021-09-01 DIAGNOSIS — M069 Rheumatoid arthritis, unspecified: Secondary | ICD-10-CM | POA: Diagnosis not present

## 2021-09-01 DIAGNOSIS — Z111 Encounter for screening for respiratory tuberculosis: Secondary | ICD-10-CM | POA: Diagnosis not present

## 2021-09-01 DIAGNOSIS — R5383 Other fatigue: Secondary | ICD-10-CM | POA: Diagnosis not present

## 2021-09-01 DIAGNOSIS — Z79899 Other long term (current) drug therapy: Secondary | ICD-10-CM | POA: Diagnosis not present

## 2021-09-18 ENCOUNTER — Ambulatory Visit (INDEPENDENT_AMBULATORY_CARE_PROVIDER_SITE_OTHER): Payer: Medicare Other

## 2021-09-18 DIAGNOSIS — I480 Paroxysmal atrial fibrillation: Secondary | ICD-10-CM | POA: Diagnosis not present

## 2021-09-18 LAB — CUP PACEART REMOTE DEVICE CHECK
Date Time Interrogation Session: 20230716000418
Implantable Pulse Generator Implant Date: 20230403

## 2021-09-20 DIAGNOSIS — M069 Rheumatoid arthritis, unspecified: Secondary | ICD-10-CM | POA: Diagnosis not present

## 2021-09-20 DIAGNOSIS — Z79899 Other long term (current) drug therapy: Secondary | ICD-10-CM | POA: Diagnosis not present

## 2021-09-20 DIAGNOSIS — Z6822 Body mass index (BMI) 22.0-22.9, adult: Secondary | ICD-10-CM | POA: Diagnosis not present

## 2021-09-20 DIAGNOSIS — M7062 Trochanteric bursitis, left hip: Secondary | ICD-10-CM | POA: Diagnosis not present

## 2021-09-20 DIAGNOSIS — M1991 Primary osteoarthritis, unspecified site: Secondary | ICD-10-CM | POA: Diagnosis not present

## 2021-09-20 DIAGNOSIS — L409 Psoriasis, unspecified: Secondary | ICD-10-CM | POA: Diagnosis not present

## 2021-09-20 DIAGNOSIS — M25562 Pain in left knee: Secondary | ICD-10-CM | POA: Diagnosis not present

## 2021-09-20 DIAGNOSIS — M810 Age-related osteoporosis without current pathological fracture: Secondary | ICD-10-CM | POA: Diagnosis not present

## 2021-09-20 DIAGNOSIS — M5136 Other intervertebral disc degeneration, lumbar region: Secondary | ICD-10-CM | POA: Diagnosis not present

## 2021-10-02 DIAGNOSIS — M069 Rheumatoid arthritis, unspecified: Secondary | ICD-10-CM | POA: Diagnosis not present

## 2021-10-19 NOTE — Progress Notes (Signed)
Carelink Summary Report / Loop Recorder 

## 2021-10-23 ENCOUNTER — Ambulatory Visit (INDEPENDENT_AMBULATORY_CARE_PROVIDER_SITE_OTHER): Payer: Medicare Other

## 2021-10-23 DIAGNOSIS — I480 Paroxysmal atrial fibrillation: Secondary | ICD-10-CM | POA: Diagnosis not present

## 2021-10-24 LAB — CUP PACEART REMOTE DEVICE CHECK
Date Time Interrogation Session: 20230818000419
Implantable Pulse Generator Implant Date: 20230403

## 2021-10-26 ENCOUNTER — Ambulatory Visit (INDEPENDENT_AMBULATORY_CARE_PROVIDER_SITE_OTHER): Payer: Medicare Other | Admitting: Physician Assistant

## 2021-10-26 ENCOUNTER — Encounter: Payer: Self-pay | Admitting: Physician Assistant

## 2021-10-26 DIAGNOSIS — G8929 Other chronic pain: Secondary | ICD-10-CM

## 2021-10-26 DIAGNOSIS — M25562 Pain in left knee: Secondary | ICD-10-CM

## 2021-10-26 MED ORDER — METHYLPREDNISOLONE ACETATE 40 MG/ML IJ SUSP
40.0000 mg | INTRAMUSCULAR | Status: AC | PRN
  Administered 2021-10-26: 40 mg via INTRA_ARTICULAR

## 2021-10-26 MED ORDER — LIDOCAINE HCL 1 % IJ SOLN
3.0000 mL | INTRAMUSCULAR | Status: AC | PRN
  Administered 2021-10-26: 3 mL

## 2021-10-26 NOTE — Progress Notes (Signed)
   Procedure Note  Patient: Shelby Walker             Date of Birth: [birth year]           MRN: [id number]             Visit Date: 10/26/2021 HPI: Shelby Walker comes in today in follow-up of her left knee pain.  She still having knee pain.  She has no numbness tingling down the leg but states that she has pain radiates from the knee down to the ankle at times never into the foot and also at times radiates completely up with the left leg to the hip.  She has had prior epidural steroid injections for her back.  She has lumbar spondylosis at L3-4 L4-5 and L5-S1.  She has had no new injuries to the knee or her back.  She takes gabapentin which does not help at all and taking tramadol which is not helping.  She notes no mechanical symptoms of the left knee.  Does note some swelling.  Physical exam: General well-developed well-nourished female ambulates slow slight antalgic gait.  Is able to get on and off the exam table on her own. Left knee: Slight effusion no abnormal warmth or erythema.  Patellofemoral crepitus with passive range of motion.  No instability valgus varus stressing.  Full range of motion of the knee.  Procedures: Visit Diagnoses:  1. Chronic pain of left knee     Large Joint Inj: L knee on 10/26/2021 3:38 PM Indications: pain Details: 22 G 1.5 in needle, superolateral approach  Arthrogram: No  Medications: 3 mL lidocaine 1 %; 40 mg methylPREDNISolone acetate 40 MG/ML Aspirate: 16 mL yellow Outcome: tolerated well, no immediate complications Procedure, treatment alternatives, risks and benefits explained, specific risks discussed. Consent was given by the patient. Immediately prior to procedure a time out was called to verify the correct patient, procedure, equipment, support staff and site/side marked as required. Patient was prepped and draped in the usual sterile fashion.     Plan: Given her recurrent effusion and only slight arthritic changes of her knee involving the  patellofemoral joint and medial joint line recommend MRI to rule out internal derangement.  See her back after the MRI to go over results and see what type of response she had in regards to her overall pain with the aspiration injection of the knee today.

## 2021-10-27 DIAGNOSIS — J069 Acute upper respiratory infection, unspecified: Secondary | ICD-10-CM | POA: Diagnosis not present

## 2021-10-27 DIAGNOSIS — M069 Rheumatoid arthritis, unspecified: Secondary | ICD-10-CM | POA: Diagnosis not present

## 2021-10-27 DIAGNOSIS — L570 Actinic keratosis: Secondary | ICD-10-CM | POA: Diagnosis not present

## 2021-10-27 DIAGNOSIS — I130 Hypertensive heart and chronic kidney disease with heart failure and stage 1 through stage 4 chronic kidney disease, or unspecified chronic kidney disease: Secondary | ICD-10-CM | POA: Diagnosis not present

## 2021-10-27 DIAGNOSIS — R0981 Nasal congestion: Secondary | ICD-10-CM | POA: Diagnosis not present

## 2021-10-27 DIAGNOSIS — N1832 Chronic kidney disease, stage 3b: Secondary | ICD-10-CM | POA: Diagnosis not present

## 2021-10-27 DIAGNOSIS — I5189 Other ill-defined heart diseases: Secondary | ICD-10-CM | POA: Diagnosis not present

## 2021-10-27 DIAGNOSIS — I480 Paroxysmal atrial fibrillation: Secondary | ICD-10-CM | POA: Diagnosis not present

## 2021-10-27 DIAGNOSIS — Z1152 Encounter for screening for COVID-19: Secondary | ICD-10-CM | POA: Diagnosis not present

## 2021-10-27 DIAGNOSIS — R051 Acute cough: Secondary | ICD-10-CM | POA: Diagnosis not present

## 2021-10-27 NOTE — Addendum Note (Signed)
Addended by: Robyne Peers on: 10/27/2021 08:15 AM   Modules accepted: Orders

## 2021-10-30 DIAGNOSIS — H16223 Keratoconjunctivitis sicca, not specified as Sjogren's, bilateral: Secondary | ICD-10-CM | POA: Diagnosis not present

## 2021-11-02 ENCOUNTER — Ambulatory Visit
Admission: RE | Admit: 2021-11-02 | Discharge: 2021-11-02 | Disposition: A | Discharge status: Home / Self Care | Payer: Medicare Other | Source: Ambulatory Visit | Attending: Physician Assistant | Admitting: Physician Assistant

## 2021-11-02 DIAGNOSIS — G8929 Other chronic pain: Secondary | ICD-10-CM

## 2021-11-02 DIAGNOSIS — M25562 Pain in left knee: Secondary | ICD-10-CM | POA: Diagnosis not present

## 2021-11-03 ENCOUNTER — Telehealth: Payer: Self-pay | Admitting: Physical Medicine and Rehabilitation

## 2021-11-03 NOTE — Telephone Encounter (Signed)
Patient's daughter [name] called to schedule an appointment with Dr. Ernestina Patches for her mother. [name] said her mother need an injection in her back. The number to contact [name] is [phone]

## 2021-11-08 ENCOUNTER — Ambulatory Visit: Payer: Medicare Other | Admitting: Cardiovascular Disease

## 2021-11-13 ENCOUNTER — Ambulatory Visit: Payer: Medicare Other | Admitting: Cardiovascular Disease

## 2021-11-13 ENCOUNTER — Telehealth: Payer: Self-pay | Admitting: Physical Medicine and Rehabilitation

## 2021-11-13 NOTE — Telephone Encounter (Signed)
IC rescheduled

## 2021-11-13 NOTE — Telephone Encounter (Signed)
Pt's daughter [name] called to reschedule appt. Please call [name] at [phone].

## 2021-11-16 ENCOUNTER — Ambulatory Visit: Payer: Medicare Other | Attending: Cardiovascular Disease | Admitting: Cardiovascular Disease

## 2021-11-16 ENCOUNTER — Encounter: Payer: Self-pay | Admitting: Cardiovascular Disease

## 2021-11-16 VITALS — BP 120/66 | HR 72 | Ht 62.5 in | Wt 118.2 lb

## 2021-11-16 DIAGNOSIS — I2510 Atherosclerotic heart disease of native coronary artery without angina pectoris: Secondary | ICD-10-CM | POA: Diagnosis not present

## 2021-11-16 DIAGNOSIS — R079 Chest pain, unspecified: Secondary | ICD-10-CM | POA: Insufficient documentation

## 2021-11-16 DIAGNOSIS — E782 Mixed hyperlipidemia: Secondary | ICD-10-CM | POA: Insufficient documentation

## 2021-11-16 LAB — BASIC METABOLIC PANEL
BUN/Creatinine Ratio: 39 — ABNORMAL HIGH (ref 12–28)
BUN: 48 mg/dL — ABNORMAL HIGH (ref 8–27)
CO2: 27 mmol/L (ref 20–29)
Calcium: 8.6 mg/dL — ABNORMAL LOW (ref 8.7–10.3)
Chloride: 106 mmol/L (ref 96–106)
Creatinine, Ser: 1.23 mg/dL — ABNORMAL HIGH (ref 0.57–1.00)
Glucose: 220 mg/dL — ABNORMAL HIGH (ref 70–99)
Potassium: 4.9 mmol/L (ref 3.5–5.2)
Sodium: 142 mmol/L (ref 134–144)
eGFR: 43 mL/min/{1.73_m2} — ABNORMAL LOW (ref >59)

## 2021-11-16 MED ORDER — METOPROLOL TARTRATE 100 MG PO TABS: ORAL_TABLET | ORAL | 0 refills | Status: DC

## 2021-11-16 NOTE — Patient Instructions (Addendum)
Medication Instructions:  Your physician recommends that you continue on your current medications as directed. Please refer to the Current Medication list given to you today.  *If you need a refill on your cardiac medications before your next appointment, please call your pharmacy*   Lab Work: BMET Today If you have labs (blood work) drawn today and your tests are completely normal, you will receive your results only by: [facility] (if you have MyChart) OR A paper copy in the mail If you have any lab test that is abnormal or we need to change your treatment, we will call you to review the results.   Testing/Procedures: Coronary CT Angiogram Your physician has requested that you have cardiac CT. Cardiac computed tomography (CT) is a painless test that uses an x-ray machine to take clear, detailed pictures of your heart. For further information please visit [facility]ugeFiesta[facility][email or web address]. Please follow instruction sheet as given.  Follow-Up: At [facility], you and your health needs are our priority.  As part of our continuing mission to provide you with exceptional heart care, we have created designated Provider Care Teams.  These Care Teams include your primary Cardiologist (physician) and Advanced Practice Providers (APPs -  Physician Assistants and Nurse Practitioners) who all work together to provide you with the care you need, when you need it.  Your next appointment:   6 month(s)  The format for your next appointment:   In Person  Provider:   Vida Roller, or Dick    Other Instructions   Your cardiac CT will be scheduled at:   [facility] [address] [place], [place] [address] [phone]   Please arrive at the [facility] and Children's Entrance (Entrance C2) of [facility] 30 minutes prior to test start time. You can use the FREE valet parking offered at entrance C (encouraged to control the heart rate for the test)  Proceed to the  [facility] Radiology Department (first floor) to check-in and test prep.  All radiology patients and guests should use entrance C2 at [facility], accessed from [facility], even though the hospital's physical address listed is [address]     Please follow these instructions carefully (unless otherwise directed):  On the Night Before the Test: Be sure to Drink plenty of water. Do not consume any caffeinated/decaffeinated beverages or chocolate 12 hours prior to your test. Do not take any antihistamines 12 hours prior to your test.  On the Day of the Test: Drink plenty of water until 1 hour prior to the test. You may take your regular medications prior to the test.  Take metoprolol (Lopressor) two hours prior to test. FEMALES- please wear underwire-free bra if available, avoid dresses & tight clothing     After the Test: Drink plenty of water. After receiving IV contrast, you may experience a mild flushed feeling. This is normal. On occasion, you may experience a mild rash up to 24 hours after the test. This is not dangerous. If this occurs, you can take Benadryl 25 mg and increase your fluid intake. If you experience trouble breathing, this can be serious. If it is severe call 911 IMMEDIATELY. If it is mild, please call our office. If you take any of these medications: Glipizide/Metformin, Avandament, Glucavance, please do not take 48 hours after completing test unless otherwise instructed.  We will call to schedule your test 2-4 weeks out understanding that some insurance companies will need an authorization prior to  the service being performed.   For non-scheduling related questions, please contact the cardiac imaging nurse navigator should you have any questions/concerns: Marchia Bond, Cardiac Imaging Nurse Navigator Gordy Clement, Cardiac Imaging Nurse Navigator [place] Direct Office Dial: [phone]   For  scheduling needs, including cancellations and rescheduling, please call [place], [phone].   Important Information About Sugar

## 2021-11-16 NOTE — Progress Notes (Signed)
Cardiology Office Note   Date:  11/16/2021   ID:  Shelby Walker, DOB [birth year], MRN [id number]  PCP:  Reynold Bowen, MD  Cardiologist:  Mertie Moores, MD  ( transfer from Dr. Ron Parker)   Chief Complaint  Patient presents with   Atrial Fibrillation    follow-up palpitations and some chest discomfort in the past     Shelby Walker is a 88 y.o. female who presents today to follow-up a history of mild chest discomfort and palpitations in the past. She is feeling well and not having any significant symptoms.  Recently she had a mild case of shingles. She also had an upper respiratory infection that is being treated and improving.  Dec 09, 2015:  Shelby Walker is seen today for the first time - transfer from Ron Parker Followed for palpitations with some associated chest pain  Stays active,  Does yard work.   These can last as long as 30 minutes .   No CP or palpitations with yard work . Typically occur at night  The CP starts first and then she has the palpitations  No recent myoview No recent monitor   August 09, 2016:  Shelby Walker is seen for follow up of her carotid disease  She had been having some chest pain . Myoview was low risk.  Still very active,  No further CP .   Has moderate carotid disease Has some burning in her legs with walking and at night   May 01, 2017.  Shelby Walker  is seen today for follow-up of her claudication and episodes of chest pain.3 She is getting over pneumonia .   Leg pain / claudication  has improved.   Has seen Dr. Saunders Revel for PAD .  She was found to have noncritical peripheral arterial disease.  She has not wanted to have any procedure and at this point she has not needed it. Dr. Forde Dandy follows / manages her lipids and diabetes.  Will request labs from recent visit   Oct. 2, 2019:  Shelby Walker is seen today .  Seen with daughter , [name]  Had an episode of syncope several weeks ago ( was very hot , standing for 45 min at a funeral )  Had a mild episode of  lightheadeeness yesterday  Admits that she does eat well or drink enough Associated with a fast HR ( felt that her heart was pounding )  Has mild carotid disease. Scheduled to have repeat carotid duplex later this month  Jan. 14, 2021 Shelby Walker is seen today for follow up of her hyperlipidemia, diabetes mellitus and chest pain and PAF  Her husband passed away this past Jun 03, 2022  No CP or dyspnea.  No syncope  Has been found to have atrial fib and is on eliquis   October 16, 2019: Shelby Walker is seen today. She was seen by Richardson Dopp, PA on July 7 for increasing shortness of breath, fatigue. Echocardiogram revealed normal left ventricular systolic function-ejection fraction is 60 to 65%..  She has mild diastolic dysfunction. Lower extremity duplex scan reveals stable bilateral peripheral arterial disease that is unchanged from previous study. She is scheduled for Lexiscan Myoview study next week.  She does not want to have the stress test.   September 21, 2020  Shelby Walker is seen for follow up for her chronic diastolic dysfunction, HLD , DM  Has claudication and has seen Dr. Saunders Revel. Had some chest pain this past 2022/06/03 night Tried a SL NTG - did not help  Found her glucose to be low , [facility] Orlene Plum )  took a glucose tab Passed out , woke up on the ground ( I suspect from her hypoglycemia )  Had difficulty getting up  Event monitor was unremarkable   November 16, 2021: Seen with [name] ( daughter )  Case seen for follow-up of her diastolic dysfunction, hyperlipidemia, diabetes mellitus. Had some chest pain recently , lasted 10 -15 min. Took a SL NTG which helped.  Past Medical History:  Diagnosis Date   Bruit    Carotid Doppler showed no significant abnormality     9per patient)   C. difficile colitis 07/2018   with severe sepsis   Chest pain, unspecified    Nuclear, May, 2008, no scar or ischemia   Diabetes mellitus    Diverticulosis    Dyslipidemia    Ejection fraction    EF 55-60%,  echo, February, 2011 // Echocardiogram 8/21: EF 60-65, no RWMA, Gr 1 DD, GLS -14%, normal RVSF, mild LAE, trivial MR, mild Shelby (mean 4 mmHg), RVSP 23.4    GERD (gastroesophageal reflux disease)    Mitral regurgitation April 21, 2009   mild,  echo, February, 2011   Osteoporosis    Palpitations    possible very brief atrial fibrillation on monitor and possible reentrant tachycardia   Psoriasis    Rheumatoid arthritis [facility])     Past Surgical History:  Procedure Laterality Date   ABDOMINAL AORTOGRAM W/LOWER EXTREMITY Right 10/19/2020   Procedure: ABDOMINAL AORTOGRAM W/LOWER EXTREMITY;  Surgeon: Wellington Hampshire, MD;  Location: [place] [facility];  Service: Cardiovascular;  Laterality: Right;   FRACTURE SURGERY     TRANSMETATARSAL AMPUTATION Right 01/18/2021   Procedure: TRANSMETATARSAL AMPUTATION;  Surgeon: Wylene Simmer, MD;  Location: [place];  Service: Orthopedics;  Laterality: Right;    Patient Active Problem List   Diagnosis Date Noted   Paroxysmal atrial fibrillation ([place])     Priority: High   Acute viral bronchitis 02/06/2021   Acute blood loss anemia    Postoperative pain    S/P transmetatarsal amputation of foot, right ([place]) 01/24/2021   Protein-calorie malnutrition, severe 01/17/2021   Diabetic foot infection ([place]) 01/16/2021   Rheumatoid arthritis ([place]) 01/16/2021   Stage 3b chronic kidney disease (CKD) ([place]) 01/16/2021   DNR (do not resuscitate) 01/16/2021   Decreased anal sphincter tone 12/13/2018   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   Irritable bowel syndrome with diarrhea - post infectious 12/12/2018   Full incontinence of feces and fecal smearing 12/12/2018   Perianal dermatitis 12/12/2018   Uncontrolled type 2 diabetes mellitus with hyperglycemia ([place]) 10/26/2018   Long-term use of immunosuppressant medication-Enbrel 10/26/2018   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 08/27/2018   Thrombocytopenia (H[facility])    Essential hypertension    Dyspnea on exertion     Steroid-induced hyperglycemia    Fatigue 08/06/2018   Mixed hyperlipidemia 09/29/2016   Stable angina ([place]) 09/29/2016   PAD (peripheral artery disease) ([place]) 08/09/2016   Preoperative clearance 05/21/2012   Precordial chest pain    Palpitations    Diabetes mellitus (HCC)    GERD (gastroesophageal reflux disease)    Ejection fraction    Carotid artery disease (HCC)    Mitral regurgitation 04/21/2009   PSORIASIS 02/09/2009   Rheumatoid arthritis flare ([place]) 02/09/2009      Current Outpatient Medications  Medication Sig Dispense Refill   acetaminophen-codeine (TYLENOL #3) 300-30 MG tablet Take 1 tablet by mouth in the morning, at noon, and at bedtime. (Patient  taking differently: Take 1 tablet by mouth as needed.) 24 tablet 0   atorvastatin (LIPITOR) 20 MG tablet Take 1 tablet (20 mg total) by mouth daily. 90 tablet 3   Calcium Carbonate-Vitamin D 600-400 MG-UNIT tablet Take 1 tablet by mouth daily at 12 noon.      carvedilol (COREG) 6.25 MG tablet Take 1 tablet (6.25 mg total) by mouth 2 (two) times daily with a meal. 180 tablet 2   diclofenac Sodium (VOLTAREN) 1 % GEL Apply 2 g topically 4 (four) times daily. 350 g 0   DULoxetine (CYMBALTA) 30 MG capsule Take 1 capsule (30 mg total) once a day by mouth for 2 weeks, then take 1 capsule (30 mg) twice a day. 60 capsule 3   esomeprazole (NEXIUM) 20 MG capsule Take 20 mg by mouth in the morning.     gabapentin (NEURONTIN) 100 MG capsule Take 1 capsule (100 mg total) by mouth 3 (three) times daily. 90 capsule 0   insulin aspart (NOVOLOG) 100 UNIT/ML FlexPen Inject 3 Units into the skin 3 (three) times daily with meals. Sliding scale 15 mL 11   Insulin Degludec (TRESIBA [place]) Inject 6-8 Units into the skin at bedtime. Sliding scale     lidocaine (LIDODERM) 5 % Place 1 patch onto the skin daily. Purchase over the counter --on for 12 hours and off for 12 hours (Patient taking differently: Place 1 patch onto the skin as needed (pain). Purchase over  the counter --on for 12 hours and off for 12 hours) 30 patch 0   lip balm (CARMEX) ointment Apply topically as needed for lip care. 7 g 0   liver oil-zinc oxide (DESITIN) 40 % ointment Apply 1 application topically 3 (three) times daily. To raw areas in between/on buttocks 56.7 g 0   Multiple Vitamins-Minerals (CENTRUM SILVER 50+WOMEN PO) Take 1 tablet by mouth daily.     nitroGLYCERIN (NITROSTAT) 0.4 MG SL tablet Place 1 tablet (0.4 mg total) under the tongue every 5 (five) minutes as needed for chest pain. 25 tablet 6   Nutritional Supplements (ENSURE ACTIVE) LIQD Take 237 mLs by mouth in the morning. Prefers Chocolate     predniSONE (DELTASONE) 1 MG tablet Take 4 mg by mouth in the morning.     pyrithione zinc (SELSUN BLUE DRY SCALP) 1 % shampoo Apply 1 application topically once a week.     RESTASIS 0.05 % ophthalmic emulsion Place 1 drop into both eyes 2 (two) times daily.     saccharomyces boulardii (FLORASTOR) 250 MG capsule Take 250 mg by mouth daily as needed (to take when taking an antibiotic).     traMADol (ULTRAM) 50 MG tablet Take 50 mg by mouth 2 (two) times daily as needed.     No current facility-administered medications for this visit.    Allergies:   Cholestyramine, Compazine [prochlorperazine edisylate], Sulfonamide derivatives, Hydrocodone, Infliximab, Leflunomide, Prochlorperazine, Rosuvastatin, and Sulfa antibiotics    Social History:  The patient  reports that she has never smoked. She has never used smokeless tobacco. She reports that she does not drink alcohol and does not use drugs.   Family History:  The patient's family history includes Diabetes in her father and mother; Heart attack in her father and mother; Heart failure in her father and mother; Stroke in her sister.    ROS: As per current history.  Other systems are negative.   Physical Exam: Blood pressure 120/66, pulse 72, height 5' 2.5" (1.588 m), weight 118 lb 3.2 oz (  53.6 kg), SpO2 98 %.       GEN:   Well nourished, well developed in no acute distress HEENT: Normal NECK: No JVD; No carotid bruits LYMPHATICS: No lymphadenopathy CARDIAC: RRR , systolic murmur  RESPIRATORY:   few rales in bases,  ( more likely atelactasis )  ABDOMEN: Soft, non-tender, non-distended MUSCULOSKELETAL:  No edema; No deformity  SKIN: Warm and dry NEUROLOGIC:  Alert and oriented x 3    EKG:    Recent Labs: 01/19/2021: Magnesium 1.7 01/25/2021: ALT 11 02/04/2021: BUN 53; Creatinine, Ser 1.25; Potassium 4.2; Sodium 137 06/05/2021: Hemoglobin 9.8; Platelets 252    Lipid Panel    Component Value Date/Time   CHOL 102 01/18/2021 0132   CHOL 129 12/24/2016 1131   TRIG 121 01/18/2021 0132   HDL 36 (L) 01/18/2021 0132   HDL 52 12/24/2016 1131   CHOLHDL 2.8 01/18/2021 0132   VLDL 24 01/18/2021 0132   LDLCALC 42 01/18/2021 0132   LDLCALC 47 12/24/2016 1131      Wt Readings from Last 3 Encounters:  11/16/21 118 lb 3.2 oz (53.6 kg)  07/26/21 120 lb (54.4 kg)  06/06/21 120 lb (54.4 kg)      Current medicines are reviewed  The patient understands her medications well.     ASSESSMENT AND PLAN:  1. Atrial fib : no recent episodes of PAF .  Has an implantable loop   2. Left carotid bruit: -     stable .  3. Claudication:      4.  Generalized weakness.   Seem fairly stable at this point . 5.. chest pain :  had another episode of CP .  Relieved with SL NTG Will get a coronary CT angio for further evaluation     Mertie Moores, MD  11/16/2021 9:30 AM    [facility] [place],  [place], [place]  [address] Pager [phone] Phone: [phone]; Fax: [phone]

## 2021-11-19 NOTE — Progress Notes (Signed)
Carelink Summary Report / Loop Recorder 

## 2021-11-20 ENCOUNTER — Ambulatory Visit: Payer: Medicare Other | Admitting: Physician Assistant

## 2021-11-21 ENCOUNTER — Encounter: Payer: Medicare Other | Admitting: Physical Medicine and Rehabilitation

## 2021-11-22 ENCOUNTER — Ambulatory Visit: Payer: Self-pay

## 2021-11-22 ENCOUNTER — Ambulatory Visit (INDEPENDENT_AMBULATORY_CARE_PROVIDER_SITE_OTHER): Payer: Medicare Other | Admitting: Physical Medicine and Rehabilitation

## 2021-11-22 VITALS — BP 135/65 | HR 71

## 2021-11-22 DIAGNOSIS — M5416 Radiculopathy, lumbar region: Secondary | ICD-10-CM

## 2021-11-22 MED ORDER — METHYLPREDNISOLONE ACETATE 80 MG/ML IJ SUSP
40.0000 mg | Freq: Once | INTRAMUSCULAR | Status: AC
  Administered 2021-11-22: 40 mg

## 2021-11-22 NOTE — Progress Notes (Signed)
Numeric Pain Rating Scale and Functional Assessment Average Pain 8   In the last MONTH (on 0-10 scale) has pain interfered with the following?  1. General activity like being  able to carry out your everyday physical activities such as walking, climbing stairs, carrying groceries, or moving a chair?  Rating(10)   +Driver, -BT, -Dye Allergies. Yes-driver No-BT No-Allergies  Tramadol/ Tylenol/ Tylenol #3 alternating for pain.  Pain in left hip/ radiates down into left leg (stabbing sensation)

## 2021-11-22 NOTE — Patient Instructions (Signed)

## 2021-11-23 ENCOUNTER — Ambulatory Visit (INDEPENDENT_AMBULATORY_CARE_PROVIDER_SITE_OTHER): Payer: Medicare Other | Admitting: Physician Assistant

## 2021-11-23 ENCOUNTER — Encounter: Payer: Self-pay | Admitting: Physician Assistant

## 2021-11-23 DIAGNOSIS — M1712 Unilateral primary osteoarthritis, left knee: Secondary | ICD-10-CM

## 2021-11-23 DIAGNOSIS — I2510 Atherosclerotic heart disease of native coronary artery without angina pectoris: Secondary | ICD-10-CM

## 2021-11-23 NOTE — Progress Notes (Signed)
HPI: Mrs. Foulk returns today for follow-up of her left knee status post MRI.  She states overall that her knee is better since undergoing the supplemental injection.  She still has some pain below the kneecap though. MRI left knee dated 11/03/2021 is reviewed with the patient.  This shows patellofemoral arthritis involving the patellar apex and medial facet.  Medial lateral compartment with some mild degenerative changes.  No meniscal tear no acute fractures bony abnormalities otherwise.  Degenerative changes of the medial meniscus without tear.  Review of systems: See HPI otherwise negative  Physical exam: General well-developed well-nourished alert female in no acute distress.  Impression: Left knee patellofemoral arthritis  Plan: We will have her work on quad strengthening exercises shown.  She understands to wait 3 months between cortisone injections in 6 months between supplemental injections.  Questions were encouraged and answered

## 2021-11-27 ENCOUNTER — Ambulatory Visit (INDEPENDENT_AMBULATORY_CARE_PROVIDER_SITE_OTHER): Payer: Medicare Other

## 2021-11-27 DIAGNOSIS — I480 Paroxysmal atrial fibrillation: Secondary | ICD-10-CM | POA: Diagnosis not present

## 2021-11-28 LAB — CUP PACEART REMOTE DEVICE CHECK
Date Time Interrogation Session: 20230920000708
Implantable Pulse Generator Implant Date: 20230403

## 2021-11-29 DIAGNOSIS — Z23 Encounter for immunization: Secondary | ICD-10-CM | POA: Diagnosis not present

## 2021-11-29 DIAGNOSIS — I129 Hypertensive chronic kidney disease with stage 1 through stage 4 chronic kidney disease, or unspecified chronic kidney disease: Secondary | ICD-10-CM | POA: Diagnosis not present

## 2021-11-29 DIAGNOSIS — E785 Hyperlipidemia, unspecified: Secondary | ICD-10-CM | POA: Diagnosis not present

## 2021-11-29 DIAGNOSIS — I5189 Other ill-defined heart diseases: Secondary | ICD-10-CM | POA: Diagnosis not present

## 2021-11-29 DIAGNOSIS — I739 Peripheral vascular disease, unspecified: Secondary | ICD-10-CM | POA: Diagnosis not present

## 2021-11-29 DIAGNOSIS — M810 Age-related osteoporosis without current pathological fracture: Secondary | ICD-10-CM | POA: Diagnosis not present

## 2021-11-29 DIAGNOSIS — Z794 Long term (current) use of insulin: Secondary | ICD-10-CM | POA: Diagnosis not present

## 2021-11-29 DIAGNOSIS — I480 Paroxysmal atrial fibrillation: Secondary | ICD-10-CM | POA: Diagnosis not present

## 2021-11-29 DIAGNOSIS — E559 Vitamin D deficiency, unspecified: Secondary | ICD-10-CM | POA: Diagnosis not present

## 2021-11-29 DIAGNOSIS — E1140 Type 2 diabetes mellitus with diabetic neuropathy, unspecified: Secondary | ICD-10-CM | POA: Diagnosis not present

## 2021-11-29 DIAGNOSIS — N1832 Chronic kidney disease, stage 3b: Secondary | ICD-10-CM | POA: Diagnosis not present

## 2021-11-29 DIAGNOSIS — Z79899 Other long term (current) drug therapy: Secondary | ICD-10-CM | POA: Diagnosis not present

## 2021-11-29 DIAGNOSIS — I700 Atherosclerosis of aorta: Secondary | ICD-10-CM | POA: Diagnosis not present

## 2021-11-29 DIAGNOSIS — I130 Hypertensive heart and chronic kidney disease with heart failure and stage 1 through stage 4 chronic kidney disease, or unspecified chronic kidney disease: Secondary | ICD-10-CM | POA: Diagnosis not present

## 2021-11-29 DIAGNOSIS — D649 Anemia, unspecified: Secondary | ICD-10-CM | POA: Diagnosis not present

## 2021-11-29 DIAGNOSIS — I679 Cerebrovascular disease, unspecified: Secondary | ICD-10-CM | POA: Diagnosis not present

## 2021-11-29 DIAGNOSIS — M069 Rheumatoid arthritis, unspecified: Secondary | ICD-10-CM | POA: Diagnosis not present

## 2021-11-29 DIAGNOSIS — I25119 Atherosclerotic heart disease of native coronary artery with unspecified angina pectoris: Secondary | ICD-10-CM | POA: Diagnosis not present

## 2021-11-29 NOTE — Procedures (Signed)
Lumbosacral Transforaminal Epidural Steroid Injection - Sub-Pedicular Approach with Fluoroscopic Guidance  Patient: Shelby Walker      Date of Birth: April 17, 1933 MRN: [id number] PCP: Reynold Bowen, MD      Visit Date: 11/22/2021   Universal Protocol:    Date/Time: 11/22/2021  Consent Given By: the patient  Position: PRONE  Additional Comments: Vital signs were monitored before and after the procedure. Patient was prepped and draped in the usual sterile fashion. The correct patient, procedure, and site was verified.   Injection Procedure Details:   Procedure diagnoses: Lumbar radiculopathy [M54.16]    Meds Administered:  Meds ordered this encounter  Medications   methylPREDNISolone acetate (DEPO-MEDROL) injection 40 mg    Laterality: Left  Location/Site: L4  Needle:5.0 in., 22 ga.  Short bevel or Quincke spinal needle  Needle Placement: Transforaminal  Findings:    -Comments: Excellent flow of contrast along the nerve, nerve root and into the epidural space.  Procedure Details: After squaring off the end-plates to get a true AP view, the C-arm was positioned so that an oblique view of the foramen as noted above was visualized. The target area is just inferior to the "nose of the scotty dog" or sub pedicular. The soft tissues overlying this structure were infiltrated with 2-3 ml. of 1% Lidocaine without Epinephrine.  The spinal needle was inserted toward the target using a "trajectory" view along the fluoroscope beam.  Under AP and lateral visualization, the needle was advanced so it did not puncture dura and was located close the 6 O'Clock position of the pedical in AP tracterory. Biplanar projections were used to confirm position. Aspiration was confirmed to be negative for CSF and/or blood. A 1-2 ml. volume of Isovue-250 was injected and flow of contrast was noted at each level. Radiographs were obtained for documentation purposes.   After attaining the desired flow of  contrast documented above, a 0.5 to 1.0 ml test dose of 0.25% Marcaine was injected into each respective transforaminal space.  The patient was observed for 90 seconds post injection.  After no sensory deficits were reported, and normal lower extremity motor function was noted,   the above injectate was administered so that equal amounts of the injectate were placed at each foramen (level) into the transforaminal epidural space.   Additional Comments:  No complications occurred Dressing: 2 x 2 sterile gauze and Band-Aid    Post-procedure details: Patient was observed during the procedure. Post-procedure instructions were reviewed.  Patient left the clinic in stable condition.

## 2021-11-29 NOTE — Progress Notes (Signed)
Shelby Walker - 88 y.o. female MRN [id number]  Date of birth: Aug 02, 1933  Office Visit Note: Visit Date: 11/22/2021 PCP: Reynold Bowen, MD Referred by: Magnus Sinning, MD  Subjective: No chief complaint on file.  HPI:  Shelby Walker is a 88 y.o. female who comes in today at the request of Benita Stabile, PA-C for planned Left L4-5 Lumbar Transforaminal epidural steroid injection with fluoroscopic guidance.  The patient has failed conservative care including home exercise, medications, time and activity modification.  This injection will be diagnostic and hopefully therapeutic.  Please see requesting physician notes for further details and justification.   ROS Otherwise per HPI.  Assessment & Plan: Visit Diagnoses:    ICD-10-CM   1. Lumbar radiculopathy  M54.16 XR C-ARM NO REPORT    Epidural Steroid injection    methylPREDNISolone acetate (DEPO-MEDROL) injection 40 mg      Plan: No additional findings.   Meds & Orders:  Meds ordered this encounter  Medications   methylPREDNISolone acetate (DEPO-MEDROL) injection 40 mg    Orders Placed This Encounter  Procedures   XR C-ARM NO REPORT   Epidural Steroid injection    Follow-up: Return for visit to requesting provider as needed.   Procedures: No procedures performed  Lumbosacral Transforaminal Epidural Steroid Injection - Sub-Pedicular Approach with Fluoroscopic Guidance  Patient: Shelby Walker      Date of Birth: [birth year] MRN: [id number] PCP: Reynold Bowen, MD      Visit Date: 11/22/2021   Universal Protocol:    Date/Time: 11/22/2021  Consent Given By: the patient  Position: PRONE  Additional Comments: Vital signs were monitored before and after the procedure. Patient was prepped and draped in the usual sterile fashion. The correct patient, procedure, and site was verified.   Injection Procedure Details:   Procedure diagnoses: Lumbar radiculopathy [M54.16]    Meds Administered:  Meds ordered this encounter   Medications   methylPREDNISolone acetate (DEPO-MEDROL) injection 40 mg    Laterality: Left  Location/Site: L4  Needle:5.0 in., 22 ga.  Short bevel or Quincke spinal needle  Needle Placement: Transforaminal  Findings:    -Comments: Excellent flow of contrast along the nerve, nerve root and into the epidural space.  Procedure Details: After squaring off the end-plates to get a true AP view, the C-arm was positioned so that an oblique view of the foramen as noted above was visualized. The target area is just inferior to the "nose of the scotty dog" or sub pedicular. The soft tissues overlying this structure were infiltrated with 2-3 ml. of 1% Lidocaine without Epinephrine.  The spinal needle was inserted toward the target using a "trajectory" view along the fluoroscope beam.  Under AP and lateral visualization, the needle was advanced so it did not puncture dura and was located close the 6 O'Clock position of the pedical in AP tracterory. Biplanar projections were used to confirm position. Aspiration was confirmed to be negative for CSF and/or blood. A 1-2 ml. volume of Isovue-250 was injected and flow of contrast was noted at each level. Radiographs were obtained for documentation purposes.   After attaining the desired flow of contrast documented above, a 0.5 to 1.0 ml test dose of 0.25% Marcaine was injected into each respective transforaminal space.  The patient was observed for 90 seconds post injection.  After no sensory deficits were reported, and normal lower extremity motor function was noted,   the above injectate was administered so that equal amounts of the injectate were placed  at each foramen (level) into the transforaminal epidural space.   Additional Comments:  No complications occurred Dressing: 2 x 2 sterile gauze and Band-Aid    Post-procedure details: Patient was observed during the procedure. Post-procedure instructions were reviewed.  Patient left the clinic in  stable condition.    Clinical History: EXAM: MRI LUMBAR SPINE WITHOUT CONTRAST   TECHNIQUE: Multiplanar, multisequence MR imaging of the lumbar spine was performed. No intravenous contrast was administered.   COMPARISON:  RADIOGRAPHY OF THE LUMBAR DATED 11/16/2020. RADIOGRAPHY OF THE LUMBAR DATED 08/07/2018.   FINDINGS: Segmentation:  Standard.   Alignment: Grade 1 anterolisthesis of L3 on L4, L4 on L5 and L5-S1 secondary to facet disease.   Vertebrae: No acute fracture, evidence of discitis, or aggressive bone lesion.   Conus medullaris and cauda equina: Conus extends to the L1-2 level. Conus and cauda equina appear normal.   Paraspinal and other soft tissues: No acute paraspinal abnormality. Right renal cyst.   Disc levels:   Disc spaces: Degenerative disease with disc height loss at L3-4, L4-5 and L5-S1.   T12-L1: Minimal broad-based disc bulge. No foraminal or central canal stenosis.   L1-L2: No significant disc bulge. No neural foraminal stenosis. No central canal stenosis.   L2-L3: Minimal broad-based disc bulge. Mild bilateral facet arthropathy. No foraminal or central canal stenosis.   L3-L4: Broad-based disc bulge with a broad left paracentral disc protrusion with mass effect on the left intraspinal L4 nerve root. Moderate bilateral facet arthropathy. Moderate-severe spinal stenosis. Left subarticular recess stenosis. Moderate left foraminal stenosis. Mild right foraminal stenosis.   L4-L5: Broad-based disc bulge with a broad central disc protrusion. Severe bilateral facet arthropathy with ligamentum flavum infolding. Severe spinal stenosis. Mild right and severe left foraminal stenosis.   L5-S1: Mild broad-based disc bulge with a broad shallow right paracentral disc protrusion with mass effect on the right intraspinal S1 nerve root. Mild bilateral foraminal stenosis. Mild bilateral facet arthropathy. No spinal stenosis.   IMPRESSION: 1. Lumbar spine  spondylosis as described above most severe at L3-4, L4-5 and L5-S1. 2. No acute osseous injury of the lumbar spine.     Electronically Signed   By: Kathreen Devoid M.D.   On: 11/23/2020 08:11     Objective:  VS:  HT:    WT:   BMI:     BP:135/65  HR:71bpm  TEMP: ( )  RESP:  Physical Exam Vitals and nursing note reviewed.  Constitutional:      General: She is not in acute distress.    Appearance: Normal appearance. She is not ill-appearing.  HENT:     Head: Normocephalic and atraumatic.     Right Ear: External ear normal.     Left Ear: External ear normal.  Eyes:     Extraocular Movements: Extraocular movements intact.  Cardiovascular:     Rate and Rhythm: Normal rate.     Pulses: Normal pulses.  Pulmonary:     Effort: Pulmonary effort is normal. No respiratory distress.  Abdominal:     General: There is no distension.     Palpations: Abdomen is soft.  Musculoskeletal:        General: Tenderness present.     Cervical back: Neck supple.     Right lower leg: No edema.     Left lower leg: No edema.     Comments: Patient has good distal strength with no pain over the greater trochanters.  No clonus or focal weakness.  Skin:    Findings:  No erythema, lesion or rash.  Neurological:     General: No focal deficit present.     Mental Status: She is alert and oriented to person, place, and time.     Sensory: No sensory deficit.     Motor: No weakness or abnormal muscle tone.     Coordination: Coordination normal.  Psychiatric:        Mood and Affect: Mood normal.        Behavior: Behavior normal.      Imaging: No results found.

## 2021-11-30 ENCOUNTER — Other Ambulatory Visit: Payer: Self-pay | Admitting: Cardiovascular Disease

## 2021-12-04 DIAGNOSIS — C44529 Squamous cell carcinoma of skin of other part of trunk: Secondary | ICD-10-CM | POA: Diagnosis not present

## 2021-12-04 DIAGNOSIS — L570 Actinic keratosis: Secondary | ICD-10-CM | POA: Diagnosis not present

## 2021-12-04 DIAGNOSIS — L821 Other seborrheic keratosis: Secondary | ICD-10-CM | POA: Diagnosis not present

## 2021-12-04 DIAGNOSIS — D692 Other nonthrombocytopenic purpura: Secondary | ICD-10-CM | POA: Diagnosis not present

## 2021-12-04 DIAGNOSIS — Z85828 Personal history of other malignant neoplasm of skin: Secondary | ICD-10-CM | POA: Diagnosis not present

## 2021-12-06 NOTE — Progress Notes (Signed)
Carelink Summary Report / Loop Recorder 

## 2021-12-12 ENCOUNTER — Telehealth (HOSPITAL_COMMUNITY): Payer: Self-pay | Admitting: Emergency Medicine

## 2021-12-12 NOTE — Telephone Encounter (Signed)
Reaching out to patient to offer assistance regarding upcoming cardiac imaging study; pt verbalizes understanding of appt date/time, parking situation and where to check in, pre-test NPO status and medications ordered, and verified current allergies; name and call back number provided for further questions should they arise Marchia Bond RN [facility] [phone] office [phone] cell  Arrival 1030 w/c entrance Difficult iv '100mg'$  metoprolol tartrate

## 2021-12-12 NOTE — Telephone Encounter (Signed)
Attempted to call patient regarding upcoming cardiac CT appointment. °Left message on voicemail with name and callback number °Worthington Cruzan RN Navigator Cardiac Imaging °[facility] °[phone] Office °[phone] Cell ° °

## 2021-12-13 ENCOUNTER — Other Ambulatory Visit: Payer: Self-pay | Admitting: Cardiovascular Disease

## 2021-12-13 ENCOUNTER — Ambulatory Visit (HOSPITAL_COMMUNITY)
Admission: RE | Admit: 2021-12-13 | Discharge: 2021-12-13 | Disposition: A | Discharge status: Home / Self Care | Payer: Medicare Other | Source: Ambulatory Visit | Attending: Cardiovascular Disease | Admitting: Cardiovascular Disease

## 2021-12-13 ENCOUNTER — Ambulatory Visit (HOSPITAL_BASED_OUTPATIENT_CLINIC_OR_DEPARTMENT_OTHER)
Admission: RE | Admit: 2021-12-13 | Discharge: 2021-12-13 | Disposition: A | Discharge status: Home / Self Care | Payer: Medicare Other | Source: Ambulatory Visit | Attending: Cardiovascular Disease | Admitting: Cardiovascular Disease

## 2021-12-13 DIAGNOSIS — R931 Abnormal findings on diagnostic imaging of heart and coronary circulation: Secondary | ICD-10-CM | POA: Diagnosis not present

## 2021-12-13 DIAGNOSIS — I2510 Atherosclerotic heart disease of native coronary artery without angina pectoris: Secondary | ICD-10-CM | POA: Diagnosis not present

## 2021-12-13 DIAGNOSIS — R0789 Other chest pain: Secondary | ICD-10-CM

## 2021-12-13 DIAGNOSIS — R079 Chest pain, unspecified: Secondary | ICD-10-CM | POA: Diagnosis not present

## 2021-12-13 MED ORDER — NITROGLYCERIN 0.4 MG SL SUBL
0.8000 mg | SUBLINGUAL_TABLET | Freq: Once | SUBLINGUAL | Status: AC
Start: 2021-12-13 — End: 2021-12-13
  Administered 2021-12-13: 0.8 mg via SUBLINGUAL

## 2021-12-13 MED ORDER — IOHEXOL 350 MG/ML SOLN
100.0000 mL | Freq: Once | INTRAVENOUS | Status: AC | PRN
  Administered 2021-12-13: 100 mL via INTRAVENOUS

## 2021-12-13 MED ORDER — NITROGLYCERIN 0.4 MG SL SUBL
SUBLINGUAL_TABLET | SUBLINGUAL | Status: AC
  Filled 2021-12-13: qty 2

## 2021-12-15 ENCOUNTER — Ambulatory Visit: Payer: Medicare Other | Admitting: Podiatry

## 2021-12-18 ENCOUNTER — Telehealth: Payer: Self-pay | Admitting: Cardiovascular Disease

## 2021-12-18 NOTE — Telephone Encounter (Signed)
Patient's daughter called and said that they have not received results from patient's test and would like for someone to call them to give them updates.

## 2021-12-20 MED ORDER — NITROGLYCERIN 0.4 MG SL SUBL: SUBLINGUAL_TABLET | SUBLINGUAL | 6 refills | Status: DC

## 2021-12-20 MED ORDER — ISOSORBIDE MONONITRATE ER 30 MG PO TB24: 30.0000 mg | ORAL_TABLET | Freq: Every day | ORAL | 11 refills | Status: DC

## 2021-12-20 NOTE — Telephone Encounter (Signed)
Daughter was calling back because she is concern about the results. Please advise

## 2021-12-20 NOTE — Telephone Encounter (Signed)
Thayer Headings, MD  12/19/2021  5:24 PM EDT     Moderate diffuse CAD Her 1st Diagonal ( branch off the LAD ) has a reduced FFR indicating a significant stenosis.   Given her age of 59, lets try medical therapy first.  Please start Imdur 30 mg a day  Have her see me in 2-3 months for follow up . If she continues to have chest pain ( responsive to SL NTG) then she should call and we will need to consider cardiac cath .    Returned call to daughter to review above recommendation. Assured her that we like to attempt least invasive therapy then move towards most invasive as pt's condition merits. She understands and agrees to plan. Requests a new rx for SL NTG be sent in to pharmacy as well as new medication. Meds sent per request and f/u appt scheduled for 02/21/22.

## 2021-12-25 ENCOUNTER — Telehealth: Payer: Self-pay | Admitting: Cardiovascular Disease

## 2021-12-25 DIAGNOSIS — U071 COVID-19: Secondary | ICD-10-CM | POA: Diagnosis not present

## 2021-12-25 DIAGNOSIS — I679 Cerebrovascular disease, unspecified: Secondary | ICD-10-CM | POA: Diagnosis not present

## 2021-12-25 DIAGNOSIS — E1140 Type 2 diabetes mellitus with diabetic neuropathy, unspecified: Secondary | ICD-10-CM | POA: Diagnosis not present

## 2021-12-25 DIAGNOSIS — J069 Acute upper respiratory infection, unspecified: Secondary | ICD-10-CM | POA: Diagnosis not present

## 2021-12-25 DIAGNOSIS — R051 Acute cough: Secondary | ICD-10-CM | POA: Diagnosis not present

## 2021-12-25 DIAGNOSIS — N1832 Chronic kidney disease, stage 3b: Secondary | ICD-10-CM | POA: Diagnosis not present

## 2021-12-25 DIAGNOSIS — M069 Rheumatoid arthritis, unspecified: Secondary | ICD-10-CM | POA: Diagnosis not present

## 2021-12-25 DIAGNOSIS — I5189 Other ill-defined heart diseases: Secondary | ICD-10-CM | POA: Diagnosis not present

## 2021-12-25 DIAGNOSIS — I130 Hypertensive heart and chronic kidney disease with heart failure and stage 1 through stage 4 chronic kidney disease, or unspecified chronic kidney disease: Secondary | ICD-10-CM | POA: Diagnosis not present

## 2021-12-25 NOTE — Telephone Encounter (Signed)
Pt c/o medication issue:  1. Name of Medication: isosorbide mononitrate (IMDUR) 30 MG 24 hr tablet  2. How are you currently taking this medication (dosage and times per day)?   3. Are you having a reaction (difficulty breathing--STAT)?   4. What is your medication issue?  Pt's daughter is requesting call back to discuss this medication and the possible side effects. She states she is worried it could lower her mother's bp. Please advise.

## 2021-12-26 NOTE — Telephone Encounter (Signed)
Returned call to daughter [name] and explained that we are using Imdur, a low dose nitrate, to help with the chest pain as it allows for vasodilation and will allow blood/oxygen to get around the narrowing/blockages that her mother has. Due to vasodilation her BP may drop some, but should not be an intense drop. Advised her to make sure to check her mom's pressure in the morning, it usually ranges in the one hundred teens on top, so she should tolerate just fine. Counseled on fluid intake and food-which she states her mom does not do good at. Mom is currently sick with COVID, but states she will start her on this medication when she's back to her normal intake. No further questions.

## 2021-12-29 ENCOUNTER — Telehealth: Payer: Self-pay

## 2021-12-29 LAB — CUP PACEART REMOTE DEVICE CHECK
Date Time Interrogation Session: 20231023001006
Implantable Pulse Generator Implant Date: 20230403

## 2021-12-29 NOTE — Telephone Encounter (Addendum)
Received the following alert transmission from [employer]:   ILR summary report received. Battery status OK. Normal device function. No new symptom, tachy, brady episodes. No new AF episodes.  2 pause episodes, 1 undersensing. 1 true pause episode on 12/19/21, duration 25 seconds. Occurred at 12:23PM. (EGM suspended 37 seconds total) Routing for review  Monthly summary reports and ROV/PRN  Of primary concern:  25 second true pause on 12/19/21 at 12:23pm.   Spoke with patient's daughter (okay per DPR).  She denies any syncopal episodes but mom has been feeling recently that she could pass out and has been dizzy and lightheaded.  Patient was dx with Covid on 12/18/21 and has been struggling with chest congestion and resp sxs.  She is following her primary provider carefully and had CXR earlier this week that ruled out pneumonia.   Patient was started on antiviral: malnupiravir on 12/19/21 for COVID tx. And earlier this week placed on abx tx following CXR per daughter.  I, also, just note that patient appears to have been started on prednisone today.    Today, she feels mom has turned a corner for the better.  She continues to monitor her carefully.   I did review with patient's daughter emergency symptoms to watch for including if syncopal event or near syncopal event occurs to call 911 and go to the hospital.  Daughter to monitor patient's BP and  HR carefully and report to [place] any concerns or go to ER if severe bradycardia/hypotension occur.  She is to encourage fluids with patient and given our number here in device clinic if any changes or concerns, unless emergent then go to the hospital.   Will forward to Dr. Caryl Comes for further review and recommendations on Monday when he is back in office.

## 2022-01-01 ENCOUNTER — Ambulatory Visit: Payer: Medicare Other | Attending: Internal Medicine

## 2022-01-01 DIAGNOSIS — I480 Paroxysmal atrial fibrillation: Secondary | ICD-10-CM | POA: Insufficient documentation

## 2022-01-03 DIAGNOSIS — L409 Psoriasis, unspecified: Secondary | ICD-10-CM | POA: Diagnosis not present

## 2022-01-03 DIAGNOSIS — Z6821 Body mass index (BMI) 21.0-21.9, adult: Secondary | ICD-10-CM | POA: Diagnosis not present

## 2022-01-03 DIAGNOSIS — M069 Rheumatoid arthritis, unspecified: Secondary | ICD-10-CM | POA: Diagnosis not present

## 2022-01-03 DIAGNOSIS — M810 Age-related osteoporosis without current pathological fracture: Secondary | ICD-10-CM | POA: Diagnosis not present

## 2022-01-03 DIAGNOSIS — Z79899 Other long term (current) drug therapy: Secondary | ICD-10-CM | POA: Diagnosis not present

## 2022-01-03 DIAGNOSIS — M1991 Primary osteoarthritis, unspecified site: Secondary | ICD-10-CM | POA: Diagnosis not present

## 2022-01-03 DIAGNOSIS — M5136 Other intervertebral disc degeneration, lumbar region: Secondary | ICD-10-CM | POA: Diagnosis not present

## 2022-01-03 DIAGNOSIS — M7062 Trochanteric bursitis, left hip: Secondary | ICD-10-CM | POA: Diagnosis not present

## 2022-01-03 DIAGNOSIS — M25562 Pain in left knee: Secondary | ICD-10-CM | POA: Diagnosis not present

## 2022-01-04 ENCOUNTER — Other Ambulatory Visit: Payer: Self-pay

## 2022-01-04 ENCOUNTER — Inpatient Hospital Stay (HOSPITAL_COMMUNITY)
Admission: EM | Admit: 2022-01-04 | Discharge: 2022-01-06 | DRG: 244 | Disposition: A | Discharge status: Home / Self Care | Payer: Medicare Other | Attending: Cardiology | Admitting: Cardiology

## 2022-01-04 ENCOUNTER — Encounter (HOSPITAL_COMMUNITY): Payer: Self-pay

## 2022-01-04 ENCOUNTER — Emergency Department (HOSPITAL_COMMUNITY): Payer: Medicare Other

## 2022-01-04 ENCOUNTER — Telehealth: Payer: Self-pay | Admitting: Internal Medicine

## 2022-01-04 DIAGNOSIS — I10 Essential (primary) hypertension: Secondary | ICD-10-CM | POA: Diagnosis not present

## 2022-01-04 DIAGNOSIS — R002 Palpitations: Secondary | ICD-10-CM | POA: Diagnosis not present

## 2022-01-04 DIAGNOSIS — I495 Sick sinus syndrome: Secondary | ICD-10-CM | POA: Diagnosis present

## 2022-01-04 DIAGNOSIS — E1122 Type 2 diabetes mellitus with diabetic chronic kidney disease: Secondary | ICD-10-CM | POA: Diagnosis present

## 2022-01-04 DIAGNOSIS — I480 Paroxysmal atrial fibrillation: Secondary | ICD-10-CM | POA: Diagnosis not present

## 2022-01-04 DIAGNOSIS — M069 Rheumatoid arthritis, unspecified: Secondary | ICD-10-CM | POA: Diagnosis present

## 2022-01-04 DIAGNOSIS — E11649 Type 2 diabetes mellitus with hypoglycemia without coma: Secondary | ICD-10-CM | POA: Diagnosis not present

## 2022-01-04 DIAGNOSIS — I129 Hypertensive chronic kidney disease with stage 1 through stage 4 chronic kidney disease, or unspecified chronic kidney disease: Secondary | ICD-10-CM | POA: Diagnosis not present

## 2022-01-04 DIAGNOSIS — K219 Gastro-esophageal reflux disease without esophagitis: Secondary | ICD-10-CM | POA: Diagnosis not present

## 2022-01-04 DIAGNOSIS — Z833 Family history of diabetes mellitus: Secondary | ICD-10-CM

## 2022-01-04 DIAGNOSIS — Z89421 Acquired absence of other right toe(s): Secondary | ICD-10-CM

## 2022-01-04 DIAGNOSIS — Z79891 Long term (current) use of opiate analgesic: Secondary | ICD-10-CM

## 2022-01-04 DIAGNOSIS — E1151 Type 2 diabetes mellitus with diabetic peripheral angiopathy without gangrene: Secondary | ICD-10-CM | POA: Diagnosis not present

## 2022-01-04 DIAGNOSIS — Z885 Allergy status to narcotic agent status: Secondary | ICD-10-CM

## 2022-01-04 DIAGNOSIS — G8929 Other chronic pain: Secondary | ICD-10-CM | POA: Diagnosis present

## 2022-01-04 DIAGNOSIS — Z8616 Personal history of COVID-19: Secondary | ICD-10-CM | POA: Diagnosis not present

## 2022-01-04 DIAGNOSIS — M810 Age-related osteoporosis without current pathological fracture: Secondary | ICD-10-CM | POA: Diagnosis present

## 2022-01-04 DIAGNOSIS — N1832 Chronic kidney disease, stage 3b: Secondary | ICD-10-CM | POA: Diagnosis present

## 2022-01-04 DIAGNOSIS — I951 Orthostatic hypotension: Secondary | ICD-10-CM | POA: Diagnosis not present

## 2022-01-04 DIAGNOSIS — Z882 Allergy status to sulfonamides status: Secondary | ICD-10-CM

## 2022-01-04 DIAGNOSIS — Z888 Allergy status to other drugs, medicaments and biological substances status: Secondary | ICD-10-CM | POA: Diagnosis not present

## 2022-01-04 DIAGNOSIS — E1165 Type 2 diabetes mellitus with hyperglycemia: Secondary | ICD-10-CM | POA: Diagnosis not present

## 2022-01-04 DIAGNOSIS — I455 Other specified heart block: Secondary | ICD-10-CM | POA: Diagnosis not present

## 2022-01-04 DIAGNOSIS — Z8249 Family history of ischemic heart disease and other diseases of the circulatory system: Secondary | ICD-10-CM

## 2022-01-04 DIAGNOSIS — Z950 Presence of cardiac pacemaker: Secondary | ICD-10-CM | POA: Diagnosis not present

## 2022-01-04 DIAGNOSIS — E785 Hyperlipidemia, unspecified: Secondary | ICD-10-CM | POA: Diagnosis present

## 2022-01-04 DIAGNOSIS — Z7952 Long term (current) use of systemic steroids: Secondary | ICD-10-CM

## 2022-01-04 DIAGNOSIS — S2231XA Fracture of one rib, right side, initial encounter for closed fracture: Secondary | ICD-10-CM | POA: Diagnosis not present

## 2022-01-04 DIAGNOSIS — I2510 Atherosclerotic heart disease of native coronary artery without angina pectoris: Secondary | ICD-10-CM | POA: Diagnosis present

## 2022-01-04 DIAGNOSIS — J9811 Atelectasis: Secondary | ICD-10-CM | POA: Diagnosis not present

## 2022-01-04 DIAGNOSIS — Z794 Long term (current) use of insulin: Secondary | ICD-10-CM | POA: Diagnosis not present

## 2022-01-04 DIAGNOSIS — E119 Type 2 diabetes mellitus without complications: Secondary | ICD-10-CM

## 2022-01-04 DIAGNOSIS — I700 Atherosclerosis of aorta: Secondary | ICD-10-CM | POA: Diagnosis not present

## 2022-01-04 DIAGNOSIS — Z79899 Other long term (current) drug therapy: Secondary | ICD-10-CM | POA: Diagnosis not present

## 2022-01-04 DIAGNOSIS — Z89431 Acquired absence of right foot: Secondary | ICD-10-CM

## 2022-01-04 DIAGNOSIS — I442 Atrioventricular block, complete: Secondary | ICD-10-CM | POA: Diagnosis not present

## 2022-01-04 LAB — BASIC METABOLIC PANEL
Anion gap: 10 (ref 5–15)
BUN: 35 mg/dL — ABNORMAL HIGH (ref 8–23)
CO2: 24 mmol/L (ref 22–32)
Calcium: 8.6 mg/dL — ABNORMAL LOW (ref 8.9–10.3)
Chloride: 107 mmol/L (ref 98–111)
Creatinine, Ser: 1.33 mg/dL — ABNORMAL HIGH (ref 0.44–1.00)
GFR, Estimated: 39 mL/min — ABNORMAL LOW (ref >60)
Glucose, Bld: 286 mg/dL — ABNORMAL HIGH (ref 70–99)
Potassium: 4.8 mmol/L (ref 3.5–5.1)
Sodium: 141 mmol/L (ref 135–145)

## 2022-01-04 LAB — MAGNESIUM: Magnesium: 2.2 mg/dL (ref 1.7–2.4)

## 2022-01-04 LAB — CBC WITH DIFFERENTIAL/PLATELET
Abs Immature Granulocytes: 0.03 10*3/uL (ref 0.00–0.07)
Basophils Absolute: 0.1 10*3/uL (ref 0.0–0.1)
Basophils Relative: 1 %
Eosinophils Absolute: 0.1 10*3/uL (ref 0.0–0.5)
Eosinophils Relative: 1 %
HCT: 31.8 % — ABNORMAL LOW (ref 36.0–46.0)
Hemoglobin: 10.2 g/dL — ABNORMAL LOW (ref 12.0–15.0)
Immature Granulocytes: 0 %
Lymphocytes Relative: 21 %
Lymphs Abs: 1.5 10*3/uL (ref 0.7–4.0)
MCH: 29.6 pg (ref 26.0–34.0)
MCHC: 32.1 g/dL (ref 30.0–36.0)
MCV: 92.2 fL (ref 80.0–100.0)
Monocytes Absolute: 0.2 10*3/uL (ref 0.1–1.0)
Monocytes Relative: 3 %
Neutro Abs: 5.3 10*3/uL (ref 1.7–7.7)
Neutrophils Relative %: 74 %
Platelets: 258 10*3/uL (ref 150–400)
RBC: 3.45 MIL/uL — ABNORMAL LOW (ref 3.87–5.11)
RDW: 14.9 % (ref 11.5–15.5)
WBC: 7.2 10*3/uL (ref 4.0–10.5)
nRBC: 0 % (ref 0.0–0.2)

## 2022-01-04 LAB — CBG MONITORING, ED: Glucose-Capillary: 214 mg/dL — ABNORMAL HIGH (ref 70–99)

## 2022-01-04 MED ORDER — PANTOPRAZOLE SODIUM 40 MG PO TBEC
40.0000 mg | DELAYED_RELEASE_TABLET | Freq: Every day | ORAL | Status: DC
  Administered 2022-01-05 – 2022-01-06 (×2): 40 mg via ORAL
  Filled 2022-01-04 (×2): qty 1

## 2022-01-04 MED ORDER — ACETAMINOPHEN 325 MG PO TABS: 650.0000 mg | ORAL_TABLET | ORAL | Status: DC | PRN

## 2022-01-04 MED ORDER — ONDANSETRON HCL 4 MG/2ML IJ SOLN: 4.0000 mg | Freq: Four times a day (QID) | INTRAMUSCULAR | Status: DC | PRN

## 2022-01-04 MED ORDER — SODIUM CHLORIDE 0.9 % IV SOLN: INTRAVENOUS | Status: DC

## 2022-01-04 MED ORDER — ATORVASTATIN CALCIUM 10 MG PO TABS
20.0000 mg | ORAL_TABLET | Freq: Every day | ORAL | Status: DC
  Administered 2022-01-05 – 2022-01-06 (×2): 20 mg via ORAL
  Filled 2022-01-04 (×2): qty 2

## 2022-01-04 MED ORDER — INSULIN ASPART 100 UNIT/ML IJ SOLN
0.0000 [IU] | INTRAMUSCULAR | Status: DC
  Administered 2022-01-04: 5 [IU] via SUBCUTANEOUS
  Administered 2022-01-06: 2 [IU] via SUBCUTANEOUS

## 2022-01-04 MED ORDER — GABAPENTIN 100 MG PO CAPS
100.0000 mg | ORAL_CAPSULE | Freq: Three times a day (TID) | ORAL | Status: DC
  Administered 2022-01-04 – 2022-01-06 (×5): 100 mg via ORAL
  Filled 2022-01-04 (×5): qty 1

## 2022-01-04 NOTE — Telephone Encounter (Signed)
She is coming in for PPM.

## 2022-01-04 NOTE — ED Triage Notes (Signed)
Patient has a loop recorder that caught a long pause on her recorder.  Cards doc called and reported the patient needed to come to ER for admission for a pacemaker.  Patient was sick 3 weeks ago with covid when this pause happened on 10/17 which was 25 secs.Patient is not symptomatic at this time.

## 2022-01-04 NOTE — Telephone Encounter (Signed)
Daughter called stating they were taking the patient to the [facility] ER.  Daughter stated RN Sonia Baller told her to call when they were enroute to the ER.

## 2022-01-04 NOTE — ED Provider Triage Note (Signed)
Emergency Medicine Provider Triage Evaluation Note  Shelby Walker , a 88 y.o. female  was evaluated in triage.  Pt complains of irregular heartbeat.  Patient currently wearing a loop recorder.  Sent by cardiology for pacemaker consideration due to recent episode of prolonged sinus pause.  Patient reports having some fast heartbeat palpitations this morning, no current chest pain or shortness of breath.  No syncope.  Review of Systems  Positive: Palpitations Negative: Syncope, chest pain  Physical Exam  BP (!) 144/61 (BP Location: Right Arm)   Pulse 71   Temp 98.6 F (37 C) (Oral)   Resp 16   SpO2 96%  Gen:   Awake, no distress   Resp:  Normal effort  MSK:   Moves extremities without difficulty  Other:  Regular rhythm, lungs clear to auscultation  Medical Decision Making  Medically screening exam initiated at 5:10 PM.  Appropriate orders placed.  Shelby Walker was informed that the remainder of the evaluation will be completed by another provider, this initial triage assessment does not replace that evaluation, and the importance of remaining in the ED until their evaluation is complete.     Carlisle Cater, PA-C 01/04/22 1713

## 2022-01-04 NOTE — Telephone Encounter (Signed)
Called daughter back and thanked her for call.  Advised staff at ER are waiting for her.

## 2022-01-04 NOTE — ED Provider Notes (Signed)
[facility] EMERGENCY DEPARTMENT Provider Note   CSN: [id number] Arrival date & time: 01/04/22  1654     History  Chief Complaint  Patient presents with   Abnormal ECG    Shelby Walker is a 88 y.o. female.  88 year old female with past medical history significant for CAD, diabetes, hypertension, hyperlipidemia, paroxysmal A-fib who presents today for evaluation of 25-second sinus pause that occurred on 10/17.  This was evaluated by the EP physician and recommended that patient come in for pacemaker placement.  Patient's daughter and son are at bedside.  They state the week of the sinus pause patient had COVID infection and was not feeling well so she spent most of her time in bed and does not recall being symptomatic at the time of the event.  Denies any chest pain, lightheadedness, palpitations.  She is not currently on Eliquis despite her history of paroxysmal A-fib.  According to the daughter this was the reason that led to loop recorder placement.  Patient was taken off of Eliquis to monitor recurrence of A-fib.  Since she was taken off she has not had any episodes of A-fib.  There was a delay between the event and when patient was instructed to come into the emergency room because the cardiologist wanted to review and ensure that this was a true sinus pause.  The history is provided by the patient. No language interpreter was used.       Home Medications Prior to Admission medications   Medication Sig Start Date End Date Taking? Authorizing Provider  acetaminophen-codeine (TYLENOL #3) 300-30 MG tablet Take 1 tablet by mouth in the morning, at noon, and at bedtime. Patient taking differently: Take 1 tablet by mouth as needed. 02/06/21   Love, Ivan Anchors, PA-C  atorvastatin (LIPITOR) 20 MG tablet Take 1 tablet (20 mg total) by mouth daily. 08/25/21   Nahser, Wonda Cheng, MD  Calcium Carbonate-Vitamin D 600-400 MG-UNIT tablet Take 1 tablet by mouth daily at 12 noon.      [provider]  carvedilol (COREG) 6.25 MG tablet TAKE 1 TABLET BY MOUTH TWICE DAILY WITH A MEAL 12/01/21   Nahser, Wonda Cheng, MD  diclofenac Sodium (VOLTAREN) 1 % GEL Apply 2 g topically 4 (four) times daily. 02/06/21   Love, Ivan Anchors, PA-C  DULoxetine (CYMBALTA) 30 MG capsule Take 1 capsule (30 mg total) once a day by mouth for 2 weeks, then take 1 capsule (30 mg) twice a day. 06/22/21   Lorine Bears, NP  esomeprazole (NEXIUM) 20 MG capsule Take 20 mg by mouth in the morning.    [provider]  gabapentin (NEURONTIN) 100 MG capsule Take 1 capsule (100 mg total) by mouth 3 (three) times daily. 02/06/21   Love, Ivan Anchors, PA-C  insulin aspart (NOVOLOG) 100 UNIT/ML FlexPen Inject 3 Units into the skin 3 (three) times daily with meals. Sliding scale 02/06/21   Love, Ivan Anchors, PA-C  Insulin Degludec (TRESIBA ) Inject 6-8 Units into the skin at bedtime. Sliding scale    [provider]  isosorbide mononitrate (IMDUR) 30 MG 24 hr tablet Take 1 tablet (30 mg total) by mouth daily. 12/20/21   Nahser, Wonda Cheng, MD  lidocaine (LIDODERM) 5 % Place 1 patch onto the skin daily. Purchase over the counter --on for 12 hours and off for 12 hours Patient taking differently: Place 1 patch onto the skin as needed (pain). Purchase over the counter --on for 12 hours and off for  12 hours 02/02/21   Love, Ivan Anchors, PA-C  lip balm (CARMEX) ointment Apply topically as needed for lip care. 02/01/21   Love, Ivan Anchors, PA-C  liver oil-zinc oxide (DESITIN) 40 % ointment Apply 1 application topically 3 (three) times daily. To raw areas in between/on buttocks 02/01/21   Love, Ivan Anchors, PA-C  metoprolol tartrate (LOPRESSOR) 100 MG tablet Take 1 tablet by mouth two hours prior to scan 11/16/21   Nahser, Wonda Cheng, MD  Multiple Vitamins-Minerals (CENTRUM SILVER 50+WOMEN PO) Take 1 tablet by mouth daily.    [provider]  nitroGLYCERIN (NITROSTAT) 0.4 MG SL tablet Dissolve 1 tablet under the tongue  every 5 minutes as needed for chest pain. Max of 3 doses, then 911. 12/20/21   Nahser, Wonda Cheng, MD  Nutritional Supplements (ENSURE ACTIVE) LIQD Take 237 mLs by mouth in the morning. Prefers Chocolate    [provider]  predniSONE (DELTASONE) 1 MG tablet Take 4 mg by mouth in the morning. 01/24/19   [provider]  pyrithione zinc (SELSUN BLUE DRY SCALP) 1 % shampoo Apply 1 application topically once a week. 04/15/15   [provider]  RESTASIS 0.05 % ophthalmic emulsion Place 1 drop into both eyes 2 (two) times daily. 02/13/19   [provider]  saccharomyces boulardii (FLORASTOR) 250 MG capsule Take 250 mg by mouth daily as needed (to take when taking an antibiotic).    [provider]  traMADol (ULTRAM) 50 MG tablet Take 50 mg by mouth 2 (two) times daily as needed. 07/18/21   [provider]      Allergies    Cholestyramine, Compazine [prochlorperazine edisylate], Sulfonamide derivatives, Hydrocodone, Infliximab, Leflunomide, Prochlorperazine, Rosuvastatin, and Sulfa antibiotics    Review of Systems   Review of Systems  Constitutional:  Negative for fever.  Respiratory:  Negative for shortness of breath.   Cardiovascular:  Negative for chest pain and palpitations.  Neurological:  Negative for syncope.  All other systems reviewed and are negative.   Physical Exam Updated Vital Signs BP (!) 144/61 (BP Location: Right Arm)   Pulse 71   Temp 98.6 F (37 C) (Oral)   Resp 16   Ht 5' 2.5" (1.588 m)   Wt 53.5 kg   SpO2 96%   BMI 21.24 kg/m  Physical Exam Vitals and nursing note reviewed.  Constitutional:      General: She is not in acute distress.    Appearance: Normal appearance. She is not ill-appearing.  HENT:     Head: Normocephalic and atraumatic.     Nose: Nose normal.  Eyes:     General: No scleral icterus.    Extraocular Movements: Extraocular movements intact.     Conjunctiva/sclera: Conjunctivae normal.   Cardiovascular:     Rate and Rhythm: Normal rate and regular rhythm.     Pulses: Normal pulses.  Pulmonary:     Effort: Pulmonary effort is normal. No respiratory distress.     Breath sounds: Normal breath sounds. No wheezing or rales.  Abdominal:     General: There is no distension.     Tenderness: There is no abdominal tenderness.  Musculoskeletal:        General: Normal range of motion.     Cervical back: Normal range of motion.  Skin:    General: Skin is warm and dry.  Neurological:     General: No focal deficit present.     Mental Status: She is alert. Mental status is at baseline.  ED Results / Procedures / Treatments   Labs (all labs ordered are listed, but only abnormal results are displayed) Labs Reviewed  CBC WITH DIFFERENTIAL/PLATELET - Abnormal; Notable for the following components:      Result Value   RBC 3.45 (*)    Hemoglobin 10.2 (*)    HCT 31.8 (*)    All other components within normal limits  BASIC METABOLIC PANEL - Abnormal; Notable for the following components:   Glucose, Bld 286 (*)    BUN 35 (*)    Creatinine, Ser 1.33 (*)    Calcium 8.6 (*)    GFR, Estimated 39 (*)    All other components within normal limits  MAGNESIUM    EKG EKG Interpretation  Date/Time:  Thursday January 04 2022 17:04:31 EDT Ventricular Rate:  70 PR Interval:  164 QRS Duration: 72 QT Interval:  386 QTC Calculation: 416 R Axis:   58 Text Interpretation: Normal sinus rhythm Normal ECG When compared with ECG of 27-Aug-2018 08:13, PREVIOUS ECG IS PRESENT No previous ECGs available Confirmed by Ezequiel Essex [phone]) on 01/04/2022 8:12:15 PM  Radiology DG Chest 2 View  Result Date: 01/04/2022 CLINICAL DATA:  Palpitation EXAM: CHEST - 2 VIEW COMPARISON:  02/04/2021 FINDINGS: Tiny right pleural effusion or pleural thickening. No focal airspace disease. Normal cardiac size. Aortic atherosclerosis. Electronic recording device over left chest. IMPRESSION: Tiny right  pleural effusion or pleural thickening. Electronically Signed   By: Donavan Foil M.D.   On: 01/04/2022 18:09    Procedures Procedures    Medications Ordered in ED Medications - No data to display  ED Course/ Medical Decision Making/ A&P                           Medical Decision Making  Medical Decision Making / ED Course   This patient presents to the ED for concern of sinus pause, this involves an extensive number of treatment options, and is a complaint that carries with it a high risk of complications and morbidity.  The differential diagnosis includes sick sinus syndrome, tachybradycardia syndrome, symptomatic sinus pause  MDM: 88 year old female with past medical history as noted above presents today for evaluation of sinus pause.  This was witnessed on the loop recorder.  She is overall well-appearing and without acute distress and without current symptoms.  CBC shows no leukocytosis, hemoglobin around baseline at 10.2.  BMP shows creatinine of 1.33 mildly elevated compared to prior, otherwise electrolytes within normal limits.  Magnesium of 2.2.  Chest x-ray without acute cardiopulmonary process.  EKG without acute ischemic changes.  Discussed with cardiologist who will evaluate patient for admission.   Lab Tests: -I ordered, reviewed, and interpreted labs.   The pertinent results include:   Labs Reviewed  CBC WITH DIFFERENTIAL/PLATELET - Abnormal; Notable for the following components:      Result Value   RBC 3.45 (*)    Hemoglobin 10.2 (*)    HCT 31.8 (*)    All other components within normal limits  BASIC METABOLIC PANEL - Abnormal; Notable for the following components:   Glucose, Bld 286 (*)    BUN 35 (*)    Creatinine, Ser 1.33 (*)    Calcium 8.6 (*)    GFR, Estimated 39 (*)    All other components within normal limits  MAGNESIUM      EKG  EKG Interpretation  Date/Time:  Thursday January 04 2022 17:04:31 EDT Ventricular Rate:  70 PR  Interval:  164 QRS  Duration: 72 QT Interval:  386 QTC Calculation: 416 R Axis:   58 Text Interpretation: Normal sinus rhythm Normal ECG When compared with ECG of 27-Aug-2018 08:13, PREVIOUS ECG IS PRESENT No previous ECGs available Confirmed by Ezequiel Essex [phone]) on 01/04/2022 8:12:15 PM         Imaging Studies ordered: I ordered imaging studies including chest x-ray I independently visualized and interpreted imaging. I agree with the radiologist interpretation   Medicines ordered and prescription drug management: No orders of the defined types were placed in this encounter.   -I have reviewed the patients home medicines and have made adjustments as needed  Reevaluation: After the interventions noted above, I reevaluated the patient and found that they have :stayed the same  Co morbidities that complicate the patient evaluation  Past Medical History:  Diagnosis Date   Bruit    Carotid Doppler showed no significant abnormality     9per patient)   C. difficile colitis 07/2018   with severe sepsis   Chest pain, unspecified    Nuclear, May, 2008, no scar or ischemia   Diabetes mellitus    Diverticulosis    Dyslipidemia    Ejection fraction    EF 55-60%, echo, February, 2011 // Echocardiogram 8/21: EF 60-65, no RWMA, Gr 1 DD, GLS -14%, normal RVSF, mild LAE, trivial MR, mild MS (mean 4 mmHg), RVSP 23.4    GERD (gastroesophageal reflux disease)    Mitral regurgitation April 21, 2009   mild,  echo, February, 2011   Osteoporosis    Palpitations    possible very brief atrial fibrillation on monitor and possible reentrant tachycardia   Psoriasis    Rheumatoid arthritis ([place])       Dispostion: Patient discussed with cardiologist will evaluate patient for admission.  Final Clinical Impression(s) / ED Diagnoses Final diagnoses:  Sinus pause    Rx / DC Orders ED Discharge Orders     None         Evlyn Courier, Hershal Coria 01/04/22 2108    Ezequiel Essex, MD 01/04/22 2230

## 2022-01-04 NOTE — H&P (Signed)
Cardiology Admission History and Physical   Patient ID: [id number] MRN: [id number]; DOB: 04-13-1933   Admission date: 01/04/2022  PCP:  Reynold Bowen, [place] [facility] Cardiologist:  Mertie Moores, MD        Chief Complaint:  called to come in due to 25 second sinus pause on ILR  Patient Profile:   Shelby Walker is a 88 y.o. female with CAD, diastolic function, P1WC, HLD, P A-fib, PAD, CKD, RA who is being seen 01/04/2022 for the evaluation of sinus pauses.  History of Present Illness:   [name] is a 88 y.o. female with CAD, diastolic function, H8NI, HLD, P A-fib, PAD, CKD, RA who is being seen 01/04/2022 for the evaluation of sinus pauses.  She has been undergoing evaluation for atrial fibrillation with an implantable loop recorder and on 10/17 had an episode of sinus pause that lasted 25 seconds.  She states that she was asymptomatic however unclear and no one was with her at the time.  During that time she had been quite ill with COVID infection and was prescribed antiviral and prednisone.  She has recovered since then.  The implantable loop recorder strips was reviewed by her cardiologist and confirmed, though she was recommended to come into the emergency department and be hospitalized for a pacemaker placement.  EP is aware that she is here.   Past Medical History:  Diagnosis Date   Bruit    Carotid Doppler showed no significant abnormality     9per patient)   C. difficile colitis 07/2018   with severe sepsis   Chest pain, unspecified    Nuclear, May, 2008, no scar or ischemia   Diabetes mellitus    Diverticulosis    Dyslipidemia    Ejection fraction    EF 55-60%, echo, February, 2011 // Echocardiogram 8/21: EF 60-65, no RWMA, Gr 1 DD, GLS -14%, normal RVSF, mild LAE, trivial MR, mild MS (mean 4 mmHg), RVSP 23.4    GERD (gastroesophageal reflux disease)    Mitral regurgitation April 21, 2009   mild,  echo, February, 2011   Osteoporosis     Palpitations    possible very brief atrial fibrillation on monitor and possible reentrant tachycardia   Psoriasis    Rheumatoid arthritis [facility])     Past Surgical History:  Procedure Laterality Date   ABDOMINAL AORTOGRAM W/LOWER EXTREMITY Right 10/19/2020   Procedure: ABDOMINAL AORTOGRAM W/LOWER EXTREMITY;  Surgeon: Wellington Hampshire, MD;  Location: [facility];  Service: Cardiovascular;  Laterality: Right;   FRACTURE SURGERY     TRANSMETATARSAL AMPUTATION Right 01/18/2021   Procedure: TRANSMETATARSAL AMPUTATION;  Surgeon: Wylene Simmer, MD;  Location: [facility];  Service: Orthopedics;  Laterality: Right;     Medications Prior to Admission: Prior to Admission medications   Medication Sig Start Date End Date Taking? Authorizing Provider  acetaminophen (TYLENOL) 650 MG CR tablet Take 1,300 mg by mouth every 8 (eight) hours as needed for pain.   Yes [provider]  acetaminophen-codeine (TYLENOL #3) 300-30 MG tablet Take 1 tablet by mouth in the morning, at noon, and at bedtime. Patient taking differently: Take 1 tablet by mouth as needed for moderate pain. 02/06/21  Yes Love, Ivan Anchors, PA-C  atorvastatin (LIPITOR) 20 MG tablet Take 1 tablet (20 mg total) by mouth daily. 08/25/21  Yes Nahser, Wonda Cheng, MD  Calcium Carbonate-Vitamin D 600-400 MG-UNIT tablet Take 1 tablet by mouth daily at 12 noon.  Yes [provider]  carvedilol (COREG) 6.25 MG tablet TAKE 1 TABLET BY MOUTH TWICE DAILY WITH A MEAL 12/01/21  Yes Nahser, Wonda Cheng, MD  diclofenac Sodium (VOLTAREN) 1 % GEL Apply 2 g topically 4 (four) times daily. 02/06/21  Yes Love, Ivan Anchors, PA-C  esomeprazole (NEXIUM) 20 MG capsule Take 20 mg by mouth in the morning.   Yes [provider]  gabapentin (NEURONTIN) 100 MG capsule Take 1 capsule (100 mg total) by mouth 3 (three) times daily. 02/06/21  Yes Love, Ivan Anchors, PA-C  insulin aspart (NOVOLOG) 100 UNIT/ML FlexPen Inject 3 Units into the skin 3 (three) times daily  with meals. Sliding scale Patient taking differently: Inject 6-8 Units into the skin 3 (three) times daily with meals. Sliding scale 02/06/21  Yes Love, Ivan Anchors, PA-C  Insulin Degludec (TRESIBA [place]t) Inject 6-8 Units into the skin at bedtime.   Yes [provider]  lip balm (CARMEX) ointment Apply topically as needed for lip care. 02/01/21  Yes Love, Ivan Anchors, PA-C  liver oil-zinc oxide (DESITIN) 40 % ointment Apply 1 application topically 3 (three) times daily. To raw areas in between/on buttocks 02/01/21  Yes Love, Ivan Anchors, PA-C  Multiple Vitamins-Minerals (CENTRUM SILVER 50+WOMEN PO) Take 1 tablet by mouth daily.   Yes [provider]  nitroGLYCERIN (NITROSTAT) 0.4 MG SL tablet Dissolve 1 tablet under the tongue every 5 minutes as needed for chest pain. Max of 3 doses, then 911. 12/20/21  Yes Nahser, Wonda Cheng, MD  Nutritional Supplements (ENSURE ACTIVE) LIQD Take 237 mLs by mouth in the morning. Prefers Chocolate   Yes [provider]  predniSONE (DELTASONE) 1 MG tablet Take 4 mg by mouth in the morning. 01/24/19  Yes [provider]  pyrithione zinc (SELSUN BLUE DRY SCALP) 1 % shampoo Apply 1 application topically once a week. 04/15/15  Yes [provider]  RESTASIS 0.05 % ophthalmic emulsion Place 1 drop into both eyes 2 (two) times daily. 02/13/19  Yes [provider]  saccharomyces boulardii (FLORASTOR) 250 MG capsule Take 250 mg by mouth 2 (two) times daily.   Yes [provider]  traMADol (ULTRAM) 50 MG tablet Take 50 mg by mouth 2 (two) times daily as needed. 07/18/21  Yes [provider]  isosorbide mononitrate (IMDUR) 30 MG 24 hr tablet Take 1 tablet (30 mg total) by mouth daily. Patient not taking: Reported on 01/04/2022 12/20/21   Nahser, Wonda Cheng, MD  metoprolol tartrate (LOPRESSOR) 100 MG tablet Take 1 tablet by mouth two hours prior to scan Patient not taking: Reported on 01/04/2022 11/16/21   Nahser, Wonda Cheng, MD      Allergies:    Allergies  Allergen Reactions   Cholestyramine     Possible- Makes throat burn. Patient said she can't take it    Compazine [Prochlorperazine Edisylate] Swelling    REACTION: " tongue swell and unable to swallow"   Sulfonamide Derivatives Other (See Comments)    REACTION: " broke out with fine itching bumps"   Hydrocodone Other (See Comments)   Infliximab Other (See Comments)    Other reaction(s): rash   Leflunomide     Other reaction(s): diarrhea   Prochlorperazine Other (See Comments)    Other reaction(s): Unknown   Rosuvastatin     Other reaction(s): muscle aches   Sulfa Antibiotics Other (See Comments)    Other reaction(s): tongue swelling    Social History:   Social History   Socioeconomic History   Marital  status: Widowed    Spouse name: [name]   Number of children: 2   Years of education: 13   Highest education level: Associate degree: academic program  Occupational History    Employer: RETIRED  Tobacco Use   Smoking status: Never   Smokeless tobacco: Never  Vaping Use   Vaping Use: Never used  Substance and Sexual Activity   Alcohol use: No    Alcohol/week: 0.0 standard drinks of alcohol   Drug use: No   Sexual activity: Yes    Birth control/protection: Post-menopausal  Other Topics Concern   Not on file  Social History Narrative   She is married to Teller and lives with him 2 children   Retired   No alcohol or drugs and never smoker   Social Determinants of Radio broadcast assistant Strain: Low Risk  (07/24/2018)   Overall Financial Resource Strain (CARDIA)    Difficulty of Paying Living Expenses: Not hard at all  Food Insecurity: No Food Insecurity (07/24/2018)   Hunger Vital Sign    Worried About Running Out of Food in the Last Year: Never true    [place] in the Last Year: Never true  Transportation Needs: No Transportation Needs (07/24/2018)   PRAPARE - Hydrologist (Medical): No    Lack of  Transportation (Non-Medical): No  Physical Activity: Sufficiently Active (07/24/2018)   Exercise Vital Sign    Days of Exercise per Week: 5 days    Minutes of Exercise per Session: 30 min  Stress: No Stress Concern Present (07/24/2018)   [place]    Feeling of Stress : Not at all  Social Connections: [place] (07/24/2018)   Social Connection and Isolation Panel [NHANES]    Frequency of Communication with Friends and Family: More than three times a week    Frequency of Social Gatherings with Friends and Family: More than three times a week    Attends Religious Services: More than 4 times per year    Active Member of [employer] or Organizations: Yes    Attends Music therapist: More than 4 times per year    Marital Status: Married  Human resources officer Violence: Not At Risk (07/24/2018)   Humiliation, Afraid, Rape, and Kick questionnaire    Fear of Current or Ex-Partner: No    Emotionally Abused: No    Physically Abused: No    Sexually Abused: No    Family History:   The patient's family history includes Diabetes in her father and mother; Heart attack in her father and mother; Heart failure in her father and mother; Stroke in her sister.    ROS:  Please see the history of present illness.  All other ROS reviewed and negative.     Physical Exam/Data:   Vitals:   01/04/22 1706 01/04/22 1735 01/04/22 2110 01/04/22 2115  BP: (!) 144/61   (!) 116/56  Pulse: 71  66 66  Resp: '16  20 15  '$ Temp: 98.6 F (37 C)     TempSrc: Oral     SpO2: 96%  97% 98%  Weight:  53.5 kg    Height:  5' 2.5" (1.588 m)     No intake or output data in the 24 hours ending 01/04/22 2159    01/04/2022    5:35 PM 11/16/2021    9:23 AM 07/26/2021    2:20 PM  Last 3 Weights  Weight (lbs) 118  lb 118 lb 3.2 oz 120 lb  Weight (kg) 53.524 kg 53.615 kg 54.432 kg     Body mass index is 21.24 kg/m.  General: Elderly female, in no  acute distress HEENT: normal Neck: no JVD Vascular: No carotid bruits; Distal pulses 2+ bilaterally   Cardiac:  normal S1, S2; RRR; no murmur  Lungs:  clear to auscultation bilaterally, no wheezing, rhonchi or rales  Abd: soft, nontender, no hepatomegaly  Ext: no edema Musculoskeletal:  No deformities, BUE and BLE strength normal and equal Skin: warm and dry  Neuro:  CNs 2-12 intact, no focal abnormalities noted Psych:  Normal affect    EKG:  The ECG that was done  was personally reviewed and demonstrates normal sinus rhythm  Relevant CV Studies: Coronary CTA 12/13/2021 with significant disease and a D1 branch of her LAD.  Echo 10/07/2019 with normal EF  Laboratory Data:  High Sensitivity Troponin:  No results for input(s): "TROPONINIHS" in the last 720 hours.    Chemistry Recent Labs  Lab 01/04/22 1729  NA 141  K 4.8  CL 107  CO2 24  GLUCOSE 286*  BUN 35*  CREATININE 1.33*  CALCIUM 8.6*  MG 2.2  GFRNONAA 39*  ANIONGAP 10    No results for input(s): "PROT", "ALBUMIN", "AST", "ALT", "ALKPHOS", "BILITOT" in the last 168 hours. Lipids No results for input(s): "CHOL", "TRIG", "HDL", "LABVLDL", "LDLCALC", "CHOLHDL" in the last 168 hours. Hematology Recent Labs  Lab 01/04/22 1729  WBC 7.2  RBC 3.45*  HGB 10.2*  HCT 31.8*  MCV 92.2  MCH 29.6  MCHC 32.1  RDW 14.9  PLT 258   Thyroid No results for input(s): "TSH", "FREET4" in the last 168 hours. BNPNo results for input(s): "BNP", "PROBNP" in the last 168 hours.  DDimer No results for input(s): "DDIMER" in the last 168 hours.   Radiology/Studies:  DG Chest 2 View  Result Date: 01/04/2022 CLINICAL DATA:  Palpitation EXAM: CHEST - 2 VIEW COMPARISON:  02/04/2021 FINDINGS: Tiny right pleural effusion or pleural thickening. No focal airspace disease. Normal cardiac size. Aortic atherosclerosis. Electronic recording device over left chest. IMPRESSION: Tiny right pleural effusion or pleural thickening. Electronically  Signed   By: Donavan Foil M.D.   On: 01/04/2022 18:09     Assessment and Plan:   Sinus pauses.  Had an episode of 25-second sinus pause and unclear if she was symptomatic but she does not think she was.  EP is aware.  Plan for pacemaker placement tomorrow.  Holding AV nodal blocking agents and not on systemic anticoagulation at this time.  She will be n.p.o. after midnight.  EP to see tomorrow. CAD.  Had a coronary CT with significant disease in the diagonal branch of her LAD.  Outpatient cardiologist elected for medical management given age.  Has not started oral nitroglycerin given low blood pressures during COVID illness recently.  Chest pain symptoms have not progressed. Paroxysmal A-fib.  Not in A-fib today and in sinus rhythm.  Had ILR to determine need for anticoagulation so not on systemic anticoagulation.  We will hold carvedilol as above. Type 2 diabetes mellitus.  On oral insulin at home.  We will continue sliding scale insulin with frequent checks. HLD.  Continue atorvastatin 20 mg. GERD.  Continue PPI. RA/chronic pain.  Continue gabapentin. CKD.  Creatinine 1.3 today.  Stable.  Will renally dose all medications.   Risk Assessment/Risk Scores:            Severity of Illness: The  appropriate patient status for this patient is INPATIENT. Inpatient status is judged to be reasonable and necessary in order to provide the required intensity of service to ensure the patient's safety. The patient's presenting symptoms, physical exam findings, and initial radiographic and laboratory data in the context of their chronic comorbidities is felt to place them at high risk for further clinical deterioration. Furthermore, it is not anticipated that the patient will be medically stable for discharge from the hospital within 2 midnights of admission.   * I certify that at the point of admission it is my clinical judgment that the patient will require inpatient hospital care spanning beyond 2  midnights from the point of admission due to high intensity of service, high risk for further deterioration and high frequency of surveillance required.*   For questions or updates, please contact [place] Please consult [email or web address] for contact info under     Signed, Doyne Keel, MD  01/04/2022 9:59 PM

## 2022-01-04 NOTE — Telephone Encounter (Signed)
Noted. MD and Cardmaster also aware

## 2022-01-04 NOTE — Telephone Encounter (Signed)
Strip reviewed by [employer] tech support and advised was true 25 second pause.  Discussed with Dr. Caryl Comes.  Per Dr. Caryl Comes Pt should go to ER for urgent pacemaker placement.  Discussed with Pt's daughter.  She is agreeable but is on her way to get a CT for herself.  She will go back and get Pt and go to [facility] ER.  EP aware of impending arrival to [facility] ER.

## 2022-01-04 NOTE — Telephone Encounter (Signed)
I spoke with patient's daughter and told her I would let Sonia Baller know.  They are about 10 minutes away from the ED

## 2022-01-05 ENCOUNTER — Inpatient Hospital Stay (HOSPITAL_COMMUNITY)
Admission: EM | Disposition: A | Discharge status: Home / Self Care | Payer: Self-pay | Source: Home / Self Care | Attending: Cardiology

## 2022-01-05 ENCOUNTER — Inpatient Hospital Stay (HOSPITAL_COMMUNITY): Payer: Medicare Other

## 2022-01-05 DIAGNOSIS — I442 Atrioventricular block, complete: Secondary | ICD-10-CM | POA: Diagnosis not present

## 2022-01-05 DIAGNOSIS — I495 Sick sinus syndrome: Secondary | ICD-10-CM

## 2022-01-05 HISTORY — PX: PACEMAKER IMPLANT: EP1218

## 2022-01-05 HISTORY — PX: LOOP RECORDER REMOVAL: EP1215

## 2022-01-05 LAB — ECHOCARDIOGRAM COMPLETE
Area-P 1/2: 2.62 cm2
Height: 62.5 in
S' Lateral: 2.4 cm
Weight: 1888 oz

## 2022-01-05 LAB — CBC WITH DIFFERENTIAL/PLATELET
Abs Immature Granulocytes: 0.02 10*3/uL (ref 0.00–0.07)
Basophils Absolute: 0 10*3/uL (ref 0.0–0.1)
Basophils Relative: 0 %
Eosinophils Absolute: 0.1 10*3/uL (ref 0.0–0.5)
Eosinophils Relative: 1 %
HCT: 30.2 % — ABNORMAL LOW (ref 36.0–46.0)
Hemoglobin: 9.6 g/dL — ABNORMAL LOW (ref 12.0–15.0)
Immature Granulocytes: 0 %
Lymphocytes Relative: 32 %
Lymphs Abs: 2.3 10*3/uL (ref 0.7–4.0)
MCH: 29 pg (ref 26.0–34.0)
MCHC: 31.8 g/dL (ref 30.0–36.0)
MCV: 91.2 fL (ref 80.0–100.0)
Monocytes Absolute: 0.6 10*3/uL (ref 0.1–1.0)
Monocytes Relative: 8 %
Neutro Abs: 4.1 10*3/uL (ref 1.7–7.7)
Neutrophils Relative %: 59 %
Platelets: 234 10*3/uL (ref 150–400)
RBC: 3.31 MIL/uL — ABNORMAL LOW (ref 3.87–5.11)
RDW: 15 % (ref 11.5–15.5)
WBC: 7 10*3/uL (ref 4.0–10.5)
nRBC: 0 % (ref 0.0–0.2)

## 2022-01-05 LAB — BASIC METABOLIC PANEL
Anion gap: 7 (ref 5–15)
BUN: 29 mg/dL — ABNORMAL HIGH (ref 8–23)
CO2: 26 mmol/L (ref 22–32)
Calcium: 8.3 mg/dL — ABNORMAL LOW (ref 8.9–10.3)
Chloride: 108 mmol/L (ref 98–111)
Creatinine, Ser: 1.11 mg/dL — ABNORMAL HIGH (ref 0.44–1.00)
GFR, Estimated: 48 mL/min — ABNORMAL LOW (ref >60)
Glucose, Bld: 88 mg/dL (ref 70–99)
Potassium: 3.8 mmol/L (ref 3.5–5.1)
Sodium: 141 mmol/L (ref 135–145)

## 2022-01-05 LAB — PROTIME-INR
INR: 1 (ref 0.8–1.2)
Prothrombin Time: 12.9 seconds (ref 11.4–15.2)

## 2022-01-05 LAB — CBG MONITORING, ED
Glucose-Capillary: 93 mg/dL (ref 70–99)
Glucose-Capillary: 78 mg/dL (ref 70–99)
Glucose-Capillary: 95 mg/dL (ref 70–99)
Glucose-Capillary: 104 mg/dL — ABNORMAL HIGH (ref 70–99)

## 2022-01-05 LAB — LIPID PANEL
Cholesterol: 149 mg/dL (ref 0–200)
HDL: 50 mg/dL (ref >40)
LDL Cholesterol: 71 mg/dL (ref 0–99)
Total CHOL/HDL Ratio: 3 RATIO
Triglycerides: 138 mg/dL (ref <150)
VLDL: 28 mg/dL (ref 0–40)

## 2022-01-05 LAB — GLUCOSE, CAPILLARY
Glucose-Capillary: 111 mg/dL — ABNORMAL HIGH (ref 70–99)
Glucose-Capillary: 106 mg/dL — ABNORMAL HIGH (ref 70–99)

## 2022-01-05 LAB — HEMOGLOBIN A1C
Hgb A1c MFr Bld: 7.7 % — ABNORMAL HIGH (ref 4.8–5.6)
Mean Plasma Glucose: 174.29 mg/dL

## 2022-01-05 SURGERY — PACEMAKER IMPLANT

## 2022-01-05 MED ORDER — ONDANSETRON HCL 4 MG/2ML IJ SOLN
INTRAMUSCULAR | Status: AC
  Filled 2022-01-05: qty 2

## 2022-01-05 MED ORDER — SODIUM CHLORIDE 0.9 % IV SOLN
INTRAVENOUS | Status: AC
  Filled 2022-01-05: qty 2

## 2022-01-05 MED ORDER — ONDANSETRON HCL 4 MG/2ML IJ SOLN
INTRAMUSCULAR | Status: DC | PRN
  Administered 2022-01-05: 2 mg via INTRAVENOUS

## 2022-01-05 MED ORDER — MIDAZOLAM HCL 5 MG/5ML IJ SOLN
INTRAMUSCULAR | Status: DC | PRN
  Administered 2022-01-05: 1 mg via INTRAVENOUS

## 2022-01-05 MED ORDER — HEPARIN (PORCINE) IN NACL 1000-0.9 UT/500ML-% IV SOLN
INTRAVENOUS | Status: DC | PRN
  Administered 2022-01-05: 500 mL

## 2022-01-05 MED ORDER — CEFAZOLIN SODIUM-DEXTROSE 2-4 GM/100ML-% IV SOLN
2.0000 g | INTRAVENOUS | Status: AC
  Administered 2022-01-05: 2 g via INTRAVENOUS

## 2022-01-05 MED ORDER — HYDRALAZINE HCL 20 MG/ML IJ SOLN
10.0000 mg | Freq: Once | INTRAMUSCULAR | Status: AC
  Administered 2022-01-05: 10 mg via INTRAVENOUS

## 2022-01-05 MED ORDER — IOHEXOL 350 MG/ML SOLN
INTRAVENOUS | Status: DC | PRN
  Administered 2022-01-05: 10 mL

## 2022-01-05 MED ORDER — FENTANYL CITRATE (PF) 100 MCG/2ML IJ SOLN
INTRAMUSCULAR | Status: DC | PRN
  Administered 2022-01-05: 12.5 ug via INTRAVENOUS

## 2022-01-05 MED ORDER — DEXTROSE 50 % IV SOLN
12.5000 g | INTRAVENOUS | Status: DC
  Filled 2022-01-05: qty 50

## 2022-01-05 MED ORDER — SODIUM CHLORIDE 0.9 % IV SOLN
80.0000 mg | INTRAVENOUS | Status: AC
  Administered 2022-01-05: 80 mg
  Filled 2022-01-05: qty 2

## 2022-01-05 MED ORDER — CHLORHEXIDINE GLUCONATE 4 % EX LIQD: 60.0000 mL | Freq: Once | CUTANEOUS | Status: DC

## 2022-01-05 MED ORDER — ONDANSETRON HCL 4 MG/2ML IJ SOLN: 4.0000 mg | Freq: Four times a day (QID) | INTRAMUSCULAR | Status: DC | PRN

## 2022-01-05 MED ORDER — HEPARIN (PORCINE) IN NACL 1000-0.9 UT/500ML-% IV SOLN
INTRAVENOUS | Status: AC
  Filled 2022-01-05: qty 500

## 2022-01-05 MED ORDER — MIDAZOLAM HCL 5 MG/5ML IJ SOLN
INTRAMUSCULAR | Status: AC
  Filled 2022-01-05: qty 5

## 2022-01-05 MED ORDER — CEFAZOLIN SODIUM-DEXTROSE 2-4 GM/100ML-% IV SOLN
INTRAVENOUS | Status: AC
  Filled 2022-01-05: qty 100

## 2022-01-05 MED ORDER — LIDOCAINE HCL (PF) 1 % IJ SOLN
INTRAMUSCULAR | Status: DC | PRN
  Administered 2022-01-05: 60 mL
  Administered 2022-01-05: 10 mL

## 2022-01-05 MED ORDER — SODIUM CHLORIDE 0.9 % IV SOLN: INTRAVENOUS | Status: DC

## 2022-01-05 MED ORDER — LIDOCAINE HCL 1 % IJ SOLN
INTRAMUSCULAR | Status: AC
  Filled 2022-01-05: qty 60

## 2022-01-05 MED ORDER — CEFAZOLIN SODIUM-DEXTROSE 1-4 GM/50ML-% IV SOLN
1.0000 g | Freq: Four times a day (QID) | INTRAVENOUS | Status: AC
  Administered 2022-01-05 – 2022-01-06 (×3): 1 g via INTRAVENOUS
  Filled 2022-01-05 (×3): qty 50

## 2022-01-05 MED ORDER — FENTANYL CITRATE (PF) 100 MCG/2ML IJ SOLN
INTRAMUSCULAR | Status: AC
  Filled 2022-01-05: qty 2

## 2022-01-05 MED ORDER — ACETAMINOPHEN 325 MG PO TABS
325.0000 mg | ORAL_TABLET | ORAL | Status: DC | PRN
  Administered 2022-01-05 – 2022-01-06 (×2): 650 mg via ORAL
  Filled 2022-01-05 (×2): qty 2

## 2022-01-05 MED ORDER — HYDRALAZINE HCL 20 MG/ML IJ SOLN
INTRAMUSCULAR | Status: AC
  Filled 2022-01-05: qty 1

## 2022-01-05 SURGICAL SUPPLY — 9 items
CABLE SURGICAL S-101-97-12 (CABLE) ×2 IMPLANT
IPG PACE AZUR XT DR MRI W1DR01 (Pacemaker) IMPLANT
KIT MICROPUNCTURE NIT STIFF (SHEATH) IMPLANT
LEAD CAPSURE NOVUS 45CM (Lead) IMPLANT
LEAD CAPSURE NOVUS 5076-52CM (Lead) IMPLANT
PACE AZURE XT DR MRI W1DR01 (Pacemaker) ×1 IMPLANT
PAD DEFIB RADIO PHYSIO CONN (PAD) ×2 IMPLANT
SHEATH 7FR PRELUDE SNAP 13 (SHEATH) IMPLANT
TRAY PACEMAKER INSERTION (PACKS) ×2 IMPLANT

## 2022-01-05 NOTE — Interval H&P Note (Signed)
History and Physical Interval Note:  01/05/2022 3:44 PM  Shelby Walker  has presented today for surgery, with the diagnosis of sinus arrest.  The various methods of treatment have been discussed with the patient and family. After consideration of risks, benefits and other options for treatment, the patient has consented to  Procedure(s): PACEMAKER IMPLANT (N/A) LOOP RECORDER REMOVAL (N/A) as a surgical intervention.  The patient's history has been reviewed, patient examined, no change in status, stable for surgery.  I have reviewed the patient's chart and labs.  Questions were answered to the patient's satisfaction.     Cristopher Peru

## 2022-01-05 NOTE — ED Notes (Signed)
Bed placement notified pt is in OR

## 2022-01-05 NOTE — ED Notes (Signed)
ECHO at bedside.

## 2022-01-05 NOTE — Telephone Encounter (Signed)
Called patients daughter back she stated that the PA had come in and just talked to them about the procedure

## 2022-01-05 NOTE — ED Notes (Signed)
Patient given broth and diet ginger ale

## 2022-01-05 NOTE — Discharge Instructions (Signed)
After Your Pacemaker   You have a [employer] Pacemaker  ACTIVITY Do not lift your arm above shoulder height for 1 week after your procedure. After 7 days, you may progress as below.  You should remove your sling 24 hours after your procedure, unless otherwise instructed by your provider.     Friday January 12, 2022  Saturday January 13, 2022 Sunday January 14, 2022 Monday January 15, 2022   Do not lift, push, pull, or carry anything over 10 pounds with the affected arm until 6 weeks (Friday February 16, 2022 ) after your procedure.   You may drive AFTER your wound check, unless you have been told otherwise by your provider.   Ask your healthcare provider when you can go back to work   INCISION/Dressing If you are on a blood thinner such as Coumadin, Xarelto, Eliquis, Plavix, or Pradaxa please confirm with your provider when this should be resumed.   If large square, outer bandage is left in place, this can be removed after 24 hours from your procedure. Do not remove steri-strips or glue as below.   Monitor your Pacemaker site for redness, swelling, and drainage. Call the device clinic at [phone] if you experience these symptoms or fever/chills.  If your incision is sealed with Steri-strips or staples, you may shower 7 days after your procedure or when told by your provider. Do not remove the steri-strips or let the shower hit directly on your site. You may wash around your site with soap and water.    If you were discharged in a sling, please do not wear this during the day more than 48 hours after your surgery unless otherwise instructed. This may increase the risk of stiffness and soreness in your shoulder.   Avoid lotions, ointments, or perfumes over your incision until it is well-healed.  You may use a hot tub or a pool AFTER your wound check appointment if the incision is completely closed.  Pacemaker Alerts:  Some alerts are vibratory and others beep. These are NOT  emergencies. Please call our office to let us know. If this occurs at night or on weekends, it can wait until the next business day. Send a remote transmission.  If your device is capable of reading fluid status (for heart failure), you will be offered monthly monitoring to review this with you.   DEVICE MANAGEMENT Remote monitoring is used to monitor your pacemaker from home. This monitoring is scheduled every 91 days by our office. It allows [place] to keep an eye on the functioning of your device to ensure it is working properly. You will routinely see your Electrophysiologist annually (more often if necessary).   You should receive your ID card for your new device in 4-8 weeks. Keep this card with you at all times once received. Consider wearing a medical alert bracelet or necklace.  Your Pacemaker may be MRI compatible. This will be discussed at your next office visit/wound check.  You should avoid contact with strong electric or magnetic fields.   Do not use amateur (ham) radio equipment or electric (arc) welding torches. MP3 player headphones with magnets should not be used. Some devices are safe to use if held at least 12 inches (30 cm) from your Pacemaker. These include power tools, lawn mowers, and speakers. If you are unsure if something is safe to use, ask your health care provider.  When using your cell phone, hold it to the ear that is on the opposite side from the  Pacemaker. Do not leave your cell phone in a pocket over the Pacemaker.  You may safely use electric blankets, heating pads, computers, and microwave ovens.  Call the office right away if: You have chest pain. You feel more short of breath than you have felt before. You feel more light-headed than you have felt before. Your incision starts to open up.  This information is not intended to replace advice given to you by your health care provider. Make sure you discuss any questions you have with your health care provider.

## 2022-01-05 NOTE — ED Notes (Addendum)
Attending at bedside.

## 2022-01-05 NOTE — ED Notes (Signed)
Pt sleeping. VSS. NAD.

## 2022-01-05 NOTE — Telephone Encounter (Signed)
Patient daughter [name] left message on the voicemail wanting the nurse to give them a call back. She has some questions about what time the procedure will be.

## 2022-01-05 NOTE — Progress Notes (Signed)
  Echocardiogram 2D Echocardiogram has been performed.  Shelby Walker 01/05/2022, 4:30 PM

## 2022-01-05 NOTE — Consult Note (Addendum)
ELECTROPHYSIOLOGY CONSULT NOTE    Patient ID: Shelby Walker MRN: [id number], DOB/AGE: 05/13/1933 88 y.o.  Admit date: 01/04/2022 Date of Consult: 01/05/2022  Primary Physician: Reynold Bowen, MD Primary Cardiologist: Mertie Moores, MD  Electrophysiologist: Dr. Caryl Comes  Referring Provider: Dr. Percival Spanish  Patient Profile: Shelby Walker is a 88 y.o. female with a history of CAD, diastolic dysfunction, D6QI, HLD, parox afib, CKD, RA who is being seen today for the evaluation of sinus pauses seen on ILR at the request of Dr. Percival Spanish.  HPI:  Shelby Walker is a 88 y.o. female with PMH as above who has been undergoing evaluation for atrial fibrillation and had ILR implanted 06/2021. On 10/17 had an episode of sinus pause that lasted 25 seconds. She states that she was asymptomatic however she was sick with COVID at the time and spending large portions of the day in bed. She was prescribed antiviral and prednisone for COVID, and has recovered fully. The implantable loop recorder strips was reviewed by her cardiologist and recommended she proceed to ER for PM placement.    Potassium3.8 (11/03 0437) Magnesium  2.2 (11/02 1729) Creatinine, ser  1.11* (11/03 0437) PLT  234 (11/03 0437) HGB  9.6* (11/03 0437) WBC 7.0 (11/03 0437)  .    She denies chest pain, palpitations, dyspnea, PND, orthopnea, nausea, vomiting, dizziness, syncope, edema, weight gain, or early satiety. She feels well currently, only symptom being hungry d/t NPO for procedure  She is accompanied by her daughter and son-in-law.  Past Medical History:  Diagnosis Date   Bruit    Carotid Doppler showed no significant abnormality     9per patient)   C. difficile colitis 07/2018   with severe sepsis   Chest pain, unspecified    Nuclear, May, 2008, no scar or ischemia   Diabetes mellitus    Diverticulosis    Dyslipidemia    Ejection fraction    EF 55-60%, echo, February, 2011 // Echocardiogram 8/21: EF 60-65, no RWMA, Gr 1 DD, GLS  -14%, normal RVSF, mild LAE, trivial MR, mild MS (mean 4 mmHg), RVSP 23.4    GERD (gastroesophageal reflux disease)    Mitral regurgitation April 21, 2009   mild,  echo, February, 2011   Osteoporosis    Palpitations    possible very brief atrial fibrillation on monitor and possible reentrant tachycardia   Psoriasis    Rheumatoid arthritis [facility])      Surgical History:  Past Surgical History:  Procedure Laterality Date   ABDOMINAL AORTOGRAM W/LOWER EXTREMITY Right 10/19/2020   Procedure: ABDOMINAL AORTOGRAM W/LOWER EXTREMITY;  Surgeon: Wellington Hampshire, MD;  Location: [facility];  Service: Cardiovascular;  Laterality: Right;   FRACTURE SURGERY     TRANSMETATARSAL AMPUTATION Right 01/18/2021   Procedure: TRANSMETATARSAL AMPUTATION;  Surgeon: Wylene Simmer, MD;  Location: [facility];  Service: Orthopedics;  Laterality: Right;     (Not in a hospital admission)   Inpatient Medications:   atorvastatin  20 mg Oral Daily   gabapentin  100 mg Oral TID   insulin aspart  0-15 Units Subcutaneous Q4H   pantoprazole  40 mg Oral Daily    Allergies:  Allergies  Allergen Reactions   Cholestyramine     Possible- Makes throat burn. Patient said she can't take it    Compazine [Prochlorperazine Edisylate] Swelling    REACTION: " tongue swell and unable to swallow"   Sulfonamide Derivatives Other (See Comments)    REACTION: " broke out with fine  itching bumps"   Hydrocodone Other (See Comments)   Infliximab Other (See Comments)    Other reaction(s): rash   Leflunomide     Other reaction(s): diarrhea   Prochlorperazine Other (See Comments)    Other reaction(s): Unknown   Rosuvastatin     Other reaction(s): muscle aches   Sulfa Antibiotics Other (See Comments)    Other reaction(s): tongue swelling    Social History   Socioeconomic History   Marital status: Widowed    Spouse name: [name]   Number of children: 2   Years of education: 13   Highest education level: Associate  degree: academic program  Occupational History    Employer: RETIRED  Tobacco Use   Smoking status: Never   Smokeless tobacco: Never  Vaping Use   Vaping Use: Never used  Substance and Sexual Activity   Alcohol use: No    Alcohol/week: 0.0 standard drinks of alcohol   Drug use: No   Sexual activity: Yes    Birth control/protection: Post-menopausal  Other Topics Concern   Not on file  Social History Narrative   She is married to [employer] and lives with him 2 children   Retired   No alcohol or drugs and never smoker   Social Determinants of Radio broadcast assistant Strain: Low Risk  (07/24/2018)   Overall Financial Resource Strain (CARDIA)    Difficulty of Paying Living Expenses: Not hard at all  Food Insecurity: No Food Insecurity (07/24/2018)   Hunger Vital Sign    Worried About Running Out of Food in the Last Year: Never true    [place] in the Last Year: Never true  Transportation Needs: No Transportation Needs (07/24/2018)   PRAPARE - Hydrologist (Medical): No    Lack of Transportation (Non-Medical): No  Physical Activity: Sufficiently Active (07/24/2018)   Exercise Vital Sign    Days of Exercise per Week: 5 days    Minutes of Exercise per Session: 30 min  Stress: No Stress Concern Present (07/24/2018)   [place]    Feeling of Stress : Not at all  Social Connections: [place] (07/24/2018)   Social Connection and Isolation Panel [NHANES]    Frequency of Communication with Friends and Family: More than three times a week    Frequency of Social Gatherings with Friends and Family: More than three times a week    Attends Religious Services: More than 4 times per year    Active Member of [employer] or Organizations: Yes    Attends Music therapist: More than 4 times per year    Marital Status: Married  Human resources officer Violence: Not At Risk (07/24/2018)    Humiliation, Afraid, Rape, and Kick questionnaire    Fear of Current or Ex-Partner: No    Emotionally Abused: No    Physically Abused: No    Sexually Abused: No     Family History  Problem Relation Age of Onset   Heart failure Father    Heart attack Father    Diabetes Father    Heart attack Mother    Heart failure Mother    Diabetes Mother    Stroke Sister      Review of Systems: All other systems reviewed and are otherwise negative except as noted above.  Physical Exam: Vitals:   01/05/22 0445 01/05/22 0515 01/05/22 0530 01/05/22 0600  BP:    (!) 145/58  Pulse: 64  67 64 64  Resp: '12 13 16 16  '$ Temp:      TempSrc:      SpO2: 97% 97% 95% 95%  Weight:      Height:        GEN- The patient is well appearing, alert and oriented x 3 today, appears stated age  HEENT: normocephalic, atraumatic; sclera clear, conjunctiva pink; hearing intact; oropharynx clear; neck supple Lungs- Clear to ausculation bilaterally, normal work of breathing.  No wheezes, rales, rhonchi Heart- Regular rate and rhythm, no murmurs, rubs or gallops GI- soft, non-tender, non-distended, bowel sounds present Extremities- no clubbing, cyanosis, or edema; DP/PT/radial pulses 2+ bilaterally,  MS- RA joint deformities on hands  Skin- warm and dry, no rash or lesion Psych- euthymic mood, full affect Neuro- strength and sensation are intact  Labs:   Lab Results  Component Value Date   WBC 7.0 01/05/2022   HGB 9.6 (L) 01/05/2022   HCT 30.2 (L) 01/05/2022   MCV 91.2 01/05/2022   PLT 234 01/05/2022    Recent Labs  Lab 01/05/22 0437  NA 141  K 3.8  CL 108  CO2 26  BUN 29*  CREATININE 1.11*  CALCIUM 8.3*  GLUCOSE 88      Radiology/Studies: DG Chest 2 View  Result Date: 01/04/2022 CLINICAL DATA:  Palpitation EXAM: CHEST - 2 VIEW COMPARISON:  02/04/2021 FINDINGS: Tiny right pleural effusion or pleural thickening. No focal airspace disease. Normal cardiac size. Aortic atherosclerosis. Electronic  recording device over left chest. IMPRESSION: Tiny right pleural effusion or pleural thickening. Electronically Signed   By: Donavan Foil M.D.   On: 01/04/2022 18:09   CUP PACEART REMOTE DEVICE CHECK  Result Date: 12/29/2021 ILR summary report received. Battery status OK. Normal device function. No new symptom, tachy, brady episodes. No new AF episodes. 2 pause episodes, 1 undersensing. 1 true pause episode on 12/19/21, duration 25 seconds. Occurred at 12:23PM. (EGM suspended 37 seconds total) Routing for review  Monthly summary reports and ROV/PRN  CT CORONARY MORPH W/CTA COR W/SCORE W/CA W/CM &/OR WO/CM  Addendum Date: 12/13/2021   ADDENDUM REPORT: 12/13/2021 14:46 EXAM: OVER-READ INTERPRETATION  CT CHEST The following report is a limited chest CT over-read performed by radiologist Dr. Lindaann Slough [facility] on 12/13/2021. This over-read does not include interpretation of cardiac or coronary anatomy or pathology. The coronary CTA interpretation by the cardiologist is attached. COMPARISON:  Chest radiographs 02/04/2021.  Abdominal CT 08/27/2018. FINDINGS: Mediastinum/Nodes: No enlarged lymph nodes within the visualized mediastinum. Lungs/Pleura: There is no pleural effusion. Mild central airway thickening and subpleural reticulation at both lung bases. Mild perifissural nodularity along the minor fissure consistent with an intrapulmonary lymph node. No suspicious pulmonary nodule. Upper abdomen: No significant findings in the visualized upper abdomen. Musculoskeletal/Chest wall: No chest wall mass or suspicious osseous findings within the visualized chest. Multilevel thoracic spondylosis. IMPRESSION: No significant extracardiac findings within the visualized chest. Electronically Signed   By: Richardean Sale M.D.   On: 12/13/2021 14:46   Result Date: 12/13/2021 CLINICAL DATA:  77F with atrial fibrillation and chest pain. EXAM: Cardiac/Coronary  CT TECHNIQUE: The patient was scanned  on a [employer]. FINDINGS: A 120 kV prospective scan was triggered in the descending thoracic aorta at 111 HU's. Axial non-contrast 3 mm slices were carried out through the heart. The data set was analyzed on a dedicated work station and scored using the [place]. Gantry rotation speed was 250 msecs and collimation was .6 mm. No  beta blockade and 0.8 mg of sl NTG was given. The 3D data set was reconstructed in 5% intervals of the 67-82 % of the R-R cycle. Diastolic phases were analyzed on a dedicated work station using MPR, MIP and VRT modes. The patient received 80 cc of contrast. Aorta: Normal size. Ascending aorta 2.8 cm. Aortic atherosclerosis. No dissection. Aortic Valve:  Trileaflet.  No calcifications. Coronary Arteries:  Normal coronary origin.  Right dominance. RCA is a large dominant artery that gives rise to PDA and PLVB. There is minimal (<25%) plaque. Left main is a large artery that gives rise to LAD, RI, and LCX arteries. There is moderate (50-69%) mixed plaque. LAD has moderate (50-69%) mixed plaque in the mid vessel. RI is a tiny vessel. LCX is a small non-dominant artery that gives rise to one large OM1 branch. OM1 has mild (1-24%) mixed plaque proximally. There is severe (>70%) stenosis in the mid vessel. There was a PAC during image acquisition led to significant step artifact. This may explain the apparent stenosis in mid OM1. Coronary Calcium Score: Left main: 334 Left anterior descending artery: 39.6 Left circumflex artery: 39.6 Right coronary artery: 13.1 Total: 426 Percentile: Valid for age 71-84 Other findings: Normal pulmonary vein drainage into the left atrium. Normal let atrial appendage without a thrombus. Normal size of the pulmonary artery. Mitral annular calcification. IMPRESSION: 1. Coronary calcium score of 426. Patient is out of the age range for comparison. 2. Normal coronary origin with right dominance. 3. There is moderate (50-69%) mixed plaque in the left  main and LAD. Can not rule out severe (>70%) splenosis in OM1. However this is also near the region of a significant step artifact and may be artifactual. CAD RADS 4. 4.  Will send study for FFR CT. Interpretation of noncardiac thoracic structures by radiology is pending. Skeet Latch, MD Electronically Signed: By: Skeet Latch M.D. On: 12/13/2021 13:53   CT CORONARY FRACTIONAL FLOW RESERVE DATA PREP  Result Date: 12/13/2021 EXAM: CT FFR ANALYSIS CLINICAL DATA:  65F with abnormal coronary CT-A. FINDINGS: FFRct analysis was performed on the original cardiac CT angiogram dataset. Diagrammatic representation of the FFRct analysis is provided in a separate PDF document in PACS. This dictation was created using the PDF document and an interactive 3D model of the results. 3D model is not available in the EMR/PACS. Normal FFR range is >0.80. 1. Left Main: FFRct 0l97.  No significant stenosis. 2. LAD: FFRct 0.92 proximal, 0.83 mid, 0.75 distal. No significant stenosis. 3. LCX: D1 FFRct 0.94 proximal, 0.61 mid. Consistent with significant stenosis. 4. RCA: FFRct 0.98 proximal, 0.91 mid, 0.86 distal. No significant stenosis. IMPRESSION: 1.  CT FFR analysis consistent with abnormal flow in D1. 2. Consider aggressive medical management vs further investigation with cardiac catheterization if appropriate. Tiffany C. Oval Linsey, MD Electronically Signed   By: Skeet Latch M.D.   On: 12/13/2021 14:02   CT CORONARY FRACTIONAL FLOW RESERVE FLUID ANALYSIS  Result Date: 12/13/2021 EXAM: CT FFR ANALYSIS CLINICAL DATA:  65F with abnormal coronary CT-A. FINDINGS: FFRct analysis was performed on the original cardiac CT angiogram dataset. Diagrammatic representation of the FFRct analysis is provided in a separate PDF document in PACS. This dictation was created using the PDF document and an interactive 3D model of the results. 3D model is not available in the EMR/PACS. Normal FFR range is >0.80. 1. Left Main: FFRct  0l97.  No significant stenosis. 2. LAD: FFRct 0.92 proximal, 0.83 mid, 0.75 distal. No significant stenosis.  3. LCX: D1 FFRct 0.94 proximal, 0.61 mid. Consistent with significant stenosis. 4. RCA: FFRct 0.98 proximal, 0.91 mid, 0.86 distal. No significant stenosis. IMPRESSION: 1.  CT FFR analysis consistent with abnormal flow in D1. 2. Consider aggressive medical management vs further investigation with cardiac catheterization if appropriate. Tiffany C. Oval Linsey, MD Electronically Signed   By: Skeet Latch M.D.   On: 12/13/2021 14:02      Cardiac Studies & Procedures      ECHOCARDIOGRAM  ECHOCARDIOGRAM COMPLETE 10/07/2019  Narrative ECHOCARDIOGRAM REPORT    Patient Name:   Shelby Walker   Date of Exam: 10/07/2019 Medical Rec #:  [id number]     Height:       61.0 in Accession #:    [id number]    Weight:       137.6 lb Date of Birth:  12-02-1933     BSA:          1.611 m Patient Age:    12 years      BP:           97/74 mmHg Patient Gender: F             HR:           76 bpm. Exam Location:  [facility]  Procedure: 2D Echo, Cardiac Doppler and Color Doppler  Indications:    R06.00 SOB  History:        Patient has prior history of Echocardiogram examinations, most recent 07/26/2018. Risk Factors:Dyslipidemia, Diabetes and HLD.  Sonographer:    Marygrace Drought RCS Referring Phys: [place]   1. Left ventricular ejection fraction, by estimation, is 60 to 65%. The left ventricle has normal function. The left ventricle has no regional wall motion abnormalities. Left ventricular diastolic parameters are consistent with Grade I diastolic dysfunction (impaired relaxation). The average left ventricular global longitudinal strain is -14.0 %. 2. Right ventricular systolic function is normal. The right ventricular size is normal. There is normal pulmonary artery systolic pressure. 3. Left atrial size was mildly dilated. 4. The mitral valve is normal in structure.  Trivial mitral valve regurgitation. Mild mitral stenosis. 5. The aortic valve is normal in structure. Aortic valve regurgitation is not visualized. No aortic stenosis is present.  FINDINGS Left Ventricle: Left ventricular ejection fraction, by estimation, is 60 to 65%. The left ventricle has normal function. The left ventricle has no regional wall motion abnormalities. The average left ventricular global longitudinal strain is -14.0 %. The left ventricular internal cavity size was normal in size. There is no left ventricular hypertrophy. Left ventricular diastolic parameters are consistent with Grade I diastolic dysfunction (impaired relaxation).  Right Ventricle: The right ventricular size is normal. No increase in right ventricular wall thickness. Right ventricular systolic function is normal. There is normal pulmonary artery systolic pressure. The tricuspid regurgitant velocity is 2.26 m/s, and with an assumed right atrial pressure of 3 mmHg, the estimated right ventricular systolic pressure is 89.3 mmHg.  Left Atrium: Left atrial size was mildly dilated.  Right Atrium: Right atrial size was normal in size.  Pericardium: There is no evidence of pericardial effusion.  Mitral Valve: The mitral valve is normal in structure. Mild to moderate mitral annular calcification. Trivial mitral valve regurgitation. Mild mitral valve stenosis. MV peak gradient, 11.3 mmHg. The mean mitral valve gradient is 4.0 mmHg.  Tricuspid Valve: The tricuspid valve is normal in structure. Tricuspid valve regurgitation is trivial.  Aortic Valve: The aortic valve is  normal in structure. Aortic valve regurgitation is not visualized. No aortic stenosis is present.  Pulmonic Valve: The pulmonic valve was normal in structure. Pulmonic valve regurgitation is not visualized. No evidence of pulmonic stenosis.  Aorta: The aortic root and ascending aorta are structurally normal, with no evidence of dilitation.  IAS/Shunts:  The atrial septum is grossly normal.   LEFT VENTRICLE PLAX 2D LVIDd:         3.80 cm  Diastology LVIDs:         2.70 cm  LV e' lateral:   5.22 cm/s LV PW:         1.10 cm  LV E/e' lateral: 24.5 LV IVS:        1.00 cm  LV e' medial:    4.35 cm/s LVOT diam:     1.80 cm  LV E/e' medial:  29.4 LV SV:         77 LV SV Index:   48       2D Longitudinal Strain LVOT Area:     2.54 cm 2D Strain GLS Avg:     -14.0 %   RIGHT VENTRICLE RV Basal diam:  3.00 cm RV S prime:     16.00 cm/s TAPSE (M-mode): 2.1 cm RVSP:           23.4 mmHg  LEFT ATRIUM             Index       RIGHT ATRIUM           Index LA diam:        4.10 cm 2.54 cm/m  RA Pressure: 3.00 mmHg LA Vol (A2C):   58.0 ml 36.00 ml/m RA Area:     12.10 cm LA Vol (A4C):   67.9 ml 42.14 ml/m RA Volume:   28.30 ml  17.56 ml/m LA Biplane Vol: 63.6 ml 39.47 ml/m AORTIC VALVE LVOT Vmax:   102.00 cm/s LVOT Vmean:  74.000 cm/s LVOT VTI:    0.303 m  AORTA Ao Root diam: 2.50 cm Ao Asc diam:  2.40 cm  MITRAL VALVE                TRICUSPID VALVE MV Area (PHT): 3.10 cm     TR Peak grad:   20.4 mmHg MV Peak grad:  11.3 mmHg    TR Vmax:        226.00 cm/s MV Mean grad:  4.0 mmHg     Estimated RAP:  3.00 mmHg MV Vmax:       1.68 m/s     RVSP:           23.4 mmHg MV Vmean:      88.9 cm/s MV Decel Time: 245 msec     SHUNTS MV E velocity: 128.00 cm/s  Systemic VTI:  0.30 m MV A velocity: 144.00 cm/s  Systemic Diam: 1.80 cm MV E/A ratio:  0.89  Mertie Moores MD Electronically signed by Mertie Moores MD Signature Date/Time: 10/07/2019/3:39:44 PM    Final     CT SCANS  CT CORONARY MORPH W/CTA COR W/SCORE 12/13/2021  Addendum 12/13/2021  2:48 PM ADDENDUM REPORT: 12/13/2021 14:46  EXAM: OVER-READ INTERPRETATION  CT CHEST  The following report is a limited chest CT over-read performed by radiologist Dr. Lindaann Slough [facility] on 12/13/2021. This over-read does not include interpretation of cardiac or  coronary anatomy or pathology. The coronary CTA interpretation by the cardiologist is attached.  COMPARISON:  Chest radiographs 02/04/2021.  Abdominal CT 08/27/2018.  FINDINGS: Mediastinum/Nodes: No enlarged lymph nodes within the visualized mediastinum.  Lungs/Pleura: There is no pleural effusion. Mild central airway thickening and subpleural reticulation at both lung bases. Mild perifissural nodularity along the minor fissure consistent with an intrapulmonary lymph node. No suspicious pulmonary nodule.  Upper abdomen: No significant findings in the visualized upper abdomen.  Musculoskeletal/Chest wall: No chest wall mass or suspicious osseous findings within the visualized chest. Multilevel thoracic spondylosis.  IMPRESSION: No significant extracardiac findings within the visualized chest.   Electronically Signed By: Richardean Sale M.D. On: 12/13/2021 14:46  Narrative CLINICAL DATA:  70F with atrial fibrillation and chest pain.  EXAM: Cardiac/Coronary  CT  TECHNIQUE: The patient was scanned on a [employer].  FINDINGS: A 120 kV prospective scan was triggered in the descending thoracic aorta at 111 HU's. Axial non-contrast 3 mm slices were carried out through the heart. The data set was analyzed on a dedicated work station and scored using the [place]. Gantry rotation speed was 250 msecs and collimation was .6 mm. No beta blockade and 0.8 mg of sl NTG was given. The 3D data set was reconstructed in 5% intervals of the 67-82 % of the R-R cycle. Diastolic phases were analyzed on a dedicated work station using MPR, MIP and VRT modes. The patient received 80 cc of contrast.  Aorta: Normal size. Ascending aorta 2.8 cm. Aortic atherosclerosis. No dissection.  Aortic Valve:  Trileaflet.  No calcifications.  Coronary Arteries:  Normal coronary origin.  Right dominance.  RCA is a large dominant artery that gives rise to PDA and PLVB. There is  minimal (<25%) plaque.  Left main is a large artery that gives rise to LAD, RI, and LCX arteries. There is moderate (50-69%) mixed plaque.  LAD has moderate (50-69%) mixed plaque in the mid vessel.  RI is a tiny vessel.  LCX is a small non-dominant artery that gives rise to one large OM1 branch. OM1 has mild (1-24%) mixed plaque proximally. There is severe (>70%) stenosis in the mid vessel. There was a PAC during image acquisition led to significant step artifact. This may explain the apparent stenosis in mid OM1.  Coronary Calcium Score:  Left main: 334  Left anterior descending artery: 39.6  Left circumflex artery: 39.6  Right coronary artery: 13.1  Total: 426  Percentile: Valid for age 9-84  Other findings:  Normal pulmonary vein drainage into the left atrium.  Normal let atrial appendage without a thrombus.  Normal size of the pulmonary artery.  Mitral annular calcification.  IMPRESSION: 1. Coronary calcium score of 426. Patient is out of the age range for comparison.  2. Normal coronary origin with right dominance.  3. There is moderate (50-69%) mixed plaque in the left main and LAD. Can not rule out severe (>70%) splenosis in OM1. However this is also near the region of a significant step artifact and may be artifactual. CAD RADS 4.  4.  Will send study for FFR CT.  Interpretation of noncardiac thoracic structures by radiology is pending.  Skeet Latch, MD  Electronically Signed: By: Skeet Latch M.D. On: 12/13/2021 13:53          EKG:11/2 NSR, narrow QRS at 70bpm (personally reviewed)  TELEMETRY: NSR, rates in 60-70s; no pauses (personally reviewed)  Device: [employer] ILR   Assessment/Plan: #) Sinus Node Dysfunction In review of ILR recording, does not appear to have p-waves preceding or during pause  Does not endorse syncope or pre-syncope, though  patient was unwell at the time Narrow QRS, Last echo 10/2019 - update echo  prior to device Meets criteria for PM, scheduled for this afternoon I reviewed risks/benefits of procedure - NPO for procedure  #) concern for paroxysmal AF No AF seen on ILR, no indication for [facility] at this time Will be able to continue to monitor for AF w PM  #) CAD Continue statin  #) T2DM Hypoglycemic currently, d5 ordered PRN  #) RA Managed by [facility] (Dr. Amil Amen) - takes Carita Pian   For questions or updates, please contact [facility] Please consult [email or web address] for contact info under Cardiology/STEMI.  Signed, Mamie Levers, NP  01/05/2022 8:23 AM  EP Attending  Patient seen and examined. The patient has been admitted with symptomatic pauses and she is on no medical therapy. On exam she is a pleasant but frail appearing 88 yo woman with fairly severe changes from RA. Lungs are clear and CV with a RRR. Ext with no edema. Neuro is non-focal.  I have discussed the indications/risks/benefits/goals/expectations of DDD PM insertion and she wishes to proceed.  Carleene Overlie Cuca Benassi,MD

## 2022-01-05 NOTE — ED Notes (Signed)
MD notified of CBG and to see if she still wants the amp of D50 given

## 2022-01-05 NOTE — H&P (View-Only) (Signed)
ELECTROPHYSIOLOGY CONSULT NOTE    Patient ID: Shelby Walker MRN: [id number], DOB/AGE: 1933/04/13 88 y.o.  Admit date: 01/04/2022 Date of Consult: 01/05/2022  Primary Physician: Reynold Bowen, MD Primary Cardiologist: Mertie Moores, MD  Electrophysiologist: Dr. Caryl Comes  Referring Provider: Dr. Percival Spanish  Patient Profile: Shelby Walker is a 88 y.o. female with a history of CAD, diastolic dysfunction, J2IN, HLD, parox afib, CKD, RA who is being seen today for the evaluation of sinus pauses seen on ILR at the request of Dr. Percival Spanish.  HPI:  Shelby Walker is a 88 y.o. female with PMH as above who has been undergoing evaluation for atrial fibrillation and had ILR implanted 06/2021. On 10/17 had an episode of sinus pause that lasted 25 seconds. She states that she was asymptomatic however she was sick with COVID at the time and spending large portions of the day in bed. She was prescribed antiviral and prednisone for COVID, and has recovered fully. The implantable loop recorder strips was reviewed by her cardiologist and recommended she proceed to ER for PM placement.    Potassium3.8 (11/03 0437) Magnesium  2.2 (11/02 1729) Creatinine, ser  1.11* (11/03 0437) PLT  234 (11/03 0437) HGB  9.6* (11/03 0437) WBC 7.0 (11/03 0437)  .    She denies chest pain, palpitations, dyspnea, PND, orthopnea, nausea, vomiting, dizziness, syncope, edema, weight gain, or early satiety. She feels well currently, only symptom being hungry d/t NPO for procedure  She is accompanied by her daughter and son-in-law.  Past Medical History:  Diagnosis Date   Bruit    Carotid Doppler showed no significant abnormality     9per patient)   C. difficile colitis 07/2018   with severe sepsis   Chest pain, unspecified    Nuclear, May, 2008, no scar or ischemia   Diabetes mellitus    Diverticulosis    Dyslipidemia    Ejection fraction    EF 55-60%, echo, February, 2011 // Echocardiogram 8/21: EF 60-65, no RWMA, Gr 1 DD, GLS  -14%, normal RVSF, mild LAE, trivial MR, mild MS (mean 4 mmHg), RVSP 23.4    GERD (gastroesophageal reflux disease)    Mitral regurgitation April 21, 2009   mild,  echo, February, 2011   Osteoporosis    Palpitations    possible very brief atrial fibrillation on monitor and possible reentrant tachycardia   Psoriasis    Rheumatoid arthritis [facility])      Surgical History:  Past Surgical History:  Procedure Laterality Date   ABDOMINAL AORTOGRAM W/LOWER EXTREMITY Right 10/19/2020   Procedure: ABDOMINAL AORTOGRAM W/LOWER EXTREMITY;  Surgeon: Wellington Hampshire, MD;  Location: [facility];  Service: Cardiovascular;  Laterality: Right;   FRACTURE SURGERY     TRANSMETATARSAL AMPUTATION Right 01/18/2021   Procedure: TRANSMETATARSAL AMPUTATION;  Surgeon: Wylene Simmer, MD;  Location: [facility];  Service: Orthopedics;  Laterality: Right;     (Not in a hospital admission)   Inpatient Medications:   atorvastatin  20 mg Oral Daily   gabapentin  100 mg Oral TID   insulin aspart  0-15 Units Subcutaneous Q4H   pantoprazole  40 mg Oral Daily    Allergies:  Allergies  Allergen Reactions   Cholestyramine     Possible- Makes throat burn. Patient said she can't take it    Compazine [Prochlorperazine Edisylate] Swelling    REACTION: " tongue swell and unable to swallow"   Sulfonamide Derivatives Other (See Comments)    REACTION: " broke out with fine  itching bumps"   Hydrocodone Other (See Comments)   Infliximab Other (See Comments)    Other reaction(s): rash   Leflunomide     Other reaction(s): diarrhea   Prochlorperazine Other (See Comments)    Other reaction(s): Unknown   Rosuvastatin     Other reaction(s): muscle aches   Sulfa Antibiotics Other (See Comments)    Other reaction(s): tongue swelling    Social History   Socioeconomic History   Marital status: Widowed    Spouse name: [name]   Number of children: 2   Years of education: 13   Highest education level: Associate  degree: academic program  Occupational History    Employer: RETIRED  Tobacco Use   Smoking status: Never   Smokeless tobacco: Never  Vaping Use   Vaping Use: Never used  Substance and Sexual Activity   Alcohol use: No    Alcohol/week: 0.0 standard drinks of alcohol   Drug use: No   Sexual activity: Yes    Birth control/protection: Post-menopausal  Other Topics Concern   Not on file  Social History Narrative   She is married to [place] and lives with him 2 children   Retired   No alcohol or drugs and never smoker   Social Determinants of Radio broadcast assistant Strain: Low Risk  (07/24/2018)   Overall Financial Resource Strain (CARDIA)    Difficulty of Paying Living Expenses: Not hard at all  Food Insecurity: No Food Insecurity (07/24/2018)   Hunger Vital Sign    Worried About Running Out of Food in the Last Year: Never true    [place] in the Last Year: Never true  Transportation Needs: No Transportation Needs (07/24/2018)   PRAPARE - Hydrologist (Medical): No    Lack of Transportation (Non-Medical): No  Physical Activity: Sufficiently Active (07/24/2018)   Exercise Vital Sign    Days of Exercise per Week: 5 days    Minutes of Exercise per Session: 30 min  Stress: No Stress Concern Present (07/24/2018)   [place]    Feeling of Stress : Not at all  Social Connections: [place] (07/24/2018)   Social Connection and Isolation Panel [NHANES]    Frequency of Communication with Friends and Family: More than three times a week    Frequency of Social Gatherings with Friends and Family: More than three times a week    Attends Religious Services: More than 4 times per year    Active Member of [employer] or Organizations: Yes    Attends Music therapist: More than 4 times per year    Marital Status: Married  Human resources officer Violence: Not At Risk (07/24/2018)    Humiliation, Afraid, Rape, and Kick questionnaire    Fear of Current or Ex-Partner: No    Emotionally Abused: No    Physically Abused: No    Sexually Abused: No     Family History  Problem Relation Age of Onset   Heart failure Father    Heart attack Father    Diabetes Father    Heart attack Mother    Heart failure Mother    Diabetes Mother    Stroke Sister      Review of Systems: All other systems reviewed and are otherwise negative except as noted above.  Physical Exam: Vitals:   01/05/22 0445 01/05/22 0515 01/05/22 0530 01/05/22 0600  BP:    (!) 145/58  Pulse: 64  67 64 64  Resp: '12 13 16 16  '$ Temp:      TempSrc:      SpO2: 97% 97% 95% 95%  Weight:      Height:        GEN- The patient is well appearing, alert and oriented x 3 today, appears stated age  HEENT: normocephalic, atraumatic; sclera clear, conjunctiva pink; hearing intact; oropharynx clear; neck supple Lungs- Clear to ausculation bilaterally, normal work of breathing.  No wheezes, rales, rhonchi Heart- Regular rate and rhythm, no murmurs, rubs or gallops GI- soft, non-tender, non-distended, bowel sounds present Extremities- no clubbing, cyanosis, or edema; DP/PT/radial pulses 2+ bilaterally,  MS- RA joint deformities on hands  Skin- warm and dry, no rash or lesion Psych- euthymic mood, full affect Neuro- strength and sensation are intact  Labs:   Lab Results  Component Value Date   WBC 7.0 01/05/2022   HGB 9.6 (L) 01/05/2022   HCT 30.2 (L) 01/05/2022   MCV 91.2 01/05/2022   PLT 234 01/05/2022    Recent Labs  Lab 01/05/22 0437  NA 141  K 3.8  CL 108  CO2 26  BUN 29*  CREATININE 1.11*  CALCIUM 8.3*  GLUCOSE 88      Radiology/Studies: DG Chest 2 View  Result Date: 01/04/2022 CLINICAL DATA:  Palpitation EXAM: CHEST - 2 VIEW COMPARISON:  02/04/2021 FINDINGS: Tiny right pleural effusion or pleural thickening. No focal airspace disease. Normal cardiac size. Aortic atherosclerosis. Electronic  recording device over left chest. IMPRESSION: Tiny right pleural effusion or pleural thickening. Electronically Signed   By: Donavan Foil M.D.   On: 01/04/2022 18:09   CUP PACEART REMOTE DEVICE CHECK  Result Date: 12/29/2021 ILR summary report received. Battery status OK. Normal device function. No new symptom, tachy, brady episodes. No new AF episodes. 2 pause episodes, 1 undersensing. 1 true pause episode on 12/19/21, duration 25 seconds. Occurred at 12:23PM. (EGM suspended 37 seconds total) Routing for review  Monthly summary reports and ROV/PRN  CT CORONARY MORPH W/CTA COR W/SCORE W/CA W/CM &/OR WO/CM  Addendum Date: 12/13/2021   ADDENDUM REPORT: 12/13/2021 14:46 EXAM: OVER-READ INTERPRETATION  CT CHEST The following report is a limited chest CT over-read performed by radiologist Dr. Lindaann Slough [facility] on 12/13/2021. This over-read does not include interpretation of cardiac or coronary anatomy or pathology. The coronary CTA interpretation by the cardiologist is attached. COMPARISON:  Chest radiographs 02/04/2021.  Abdominal CT 08/27/2018. FINDINGS: Mediastinum/Nodes: No enlarged lymph nodes within the visualized mediastinum. Lungs/Pleura: There is no pleural effusion. Mild central airway thickening and subpleural reticulation at both lung bases. Mild perifissural nodularity along the minor fissure consistent with an intrapulmonary lymph node. No suspicious pulmonary nodule. Upper abdomen: No significant findings in the visualized upper abdomen. Musculoskeletal/Chest wall: No chest wall mass or suspicious osseous findings within the visualized chest. Multilevel thoracic spondylosis. IMPRESSION: No significant extracardiac findings within the visualized chest. Electronically Signed   By: Richardean Sale M.D.   On: 12/13/2021 14:46   Result Date: 12/13/2021 CLINICAL DATA:  27F with atrial fibrillation and chest pain. EXAM: Cardiac/Coronary  CT TECHNIQUE: The patient was scanned  on a [employer]. FINDINGS: A 120 kV prospective scan was triggered in the descending thoracic aorta at 111 HU's. Axial non-contrast 3 mm slices were carried out through the heart. The data set was analyzed on a dedicated work station and scored using the [place]. Gantry rotation speed was 250 msecs and collimation was .6 mm. No  beta blockade and 0.8 mg of sl NTG was given. The 3D data set was reconstructed in 5% intervals of the 67-82 % of the R-R cycle. Diastolic phases were analyzed on a dedicated work station using MPR, MIP and VRT modes. The patient received 80 cc of contrast. Aorta: Normal size. Ascending aorta 2.8 cm. Aortic atherosclerosis. No dissection. Aortic Valve:  Trileaflet.  No calcifications. Coronary Arteries:  Normal coronary origin.  Right dominance. RCA is a large dominant artery that gives rise to PDA and PLVB. There is minimal (<25%) plaque. Left main is a large artery that gives rise to LAD, RI, and LCX arteries. There is moderate (50-69%) mixed plaque. LAD has moderate (50-69%) mixed plaque in the mid vessel. RI is a tiny vessel. LCX is a small non-dominant artery that gives rise to one large OM1 branch. OM1 has mild (1-24%) mixed plaque proximally. There is severe (>70%) stenosis in the mid vessel. There was a PAC during image acquisition led to significant step artifact. This may explain the apparent stenosis in mid OM1. Coronary Calcium Score: Left main: 334 Left anterior descending artery: 39.6 Left circumflex artery: 39.6 Right coronary artery: 13.1 Total: 426 Percentile: Valid for age 53-84 Other findings: Normal pulmonary vein drainage into the left atrium. Normal let atrial appendage without a thrombus. Normal size of the pulmonary artery. Mitral annular calcification. IMPRESSION: 1. Coronary calcium score of 426. Patient is out of the age range for comparison. 2. Normal coronary origin with right dominance. 3. There is moderate (50-69%) mixed plaque in the left  main and LAD. Can not rule out severe (>70%) splenosis in OM1. However this is also near the region of a significant step artifact and may be artifactual. CAD RADS 4. 4.  Will send study for FFR CT. Interpretation of noncardiac thoracic structures by radiology is pending. Skeet Latch, MD Electronically Signed: By: Skeet Latch M.D. On: 12/13/2021 13:53   CT CORONARY FRACTIONAL FLOW RESERVE DATA PREP  Result Date: 12/13/2021 EXAM: CT FFR ANALYSIS CLINICAL DATA:  59F with abnormal coronary CT-A. FINDINGS: FFRct analysis was performed on the original cardiac CT angiogram dataset. Diagrammatic representation of the FFRct analysis is provided in a separate PDF document in PACS. This dictation was created using the PDF document and an interactive 3D model of the results. 3D model is not available in the EMR/PACS. Normal FFR range is >0.80. 1. Left Main: FFRct 0l97.  No significant stenosis. 2. LAD: FFRct 0.92 proximal, 0.83 mid, 0.75 distal. No significant stenosis. 3. LCX: D1 FFRct 0.94 proximal, 0.61 mid. Consistent with significant stenosis. 4. RCA: FFRct 0.98 proximal, 0.91 mid, 0.86 distal. No significant stenosis. IMPRESSION: 1.  CT FFR analysis consistent with abnormal flow in D1. 2. Consider aggressive medical management vs further investigation with cardiac catheterization if appropriate. Tiffany C. Oval Linsey, MD Electronically Signed   By: Skeet Latch M.D.   On: 12/13/2021 14:02   CT CORONARY FRACTIONAL FLOW RESERVE FLUID ANALYSIS  Result Date: 12/13/2021 EXAM: CT FFR ANALYSIS CLINICAL DATA:  59F with abnormal coronary CT-A. FINDINGS: FFRct analysis was performed on the original cardiac CT angiogram dataset. Diagrammatic representation of the FFRct analysis is provided in a separate PDF document in PACS. This dictation was created using the PDF document and an interactive 3D model of the results. 3D model is not available in the EMR/PACS. Normal FFR range is >0.80. 1. Left Main: FFRct  0l97.  No significant stenosis. 2. LAD: FFRct 0.92 proximal, 0.83 mid, 0.75 distal. No significant stenosis.  3. LCX: D1 FFRct 0.94 proximal, 0.61 mid. Consistent with significant stenosis. 4. RCA: FFRct 0.98 proximal, 0.91 mid, 0.86 distal. No significant stenosis. IMPRESSION: 1.  CT FFR analysis consistent with abnormal flow in D1. 2. Consider aggressive medical management vs further investigation with cardiac catheterization if appropriate. Tiffany C. Oval Linsey, MD Electronically Signed   By: Skeet Latch M.D.   On: 12/13/2021 14:02      Cardiac Studies & Procedures      ECHOCARDIOGRAM  ECHOCARDIOGRAM COMPLETE 10/07/2019  Narrative ECHOCARDIOGRAM REPORT    Patient Name:   Shelby Walker   Date of Exam: 10/07/2019 Medical Rec #:  [id number]     Height:       61.0 in Accession #:    [id number]    Weight:       137.6 lb Date of Birth:  [birth year]     BSA:          1.611 m Patient Age:    37 years      BP:           97/74 mmHg Patient Gender: F             HR:           76 bpm. Exam Location:  [facility]  Procedure: 2D Echo, Cardiac Doppler and Color Doppler  Indications:    R06.00 SOB  History:        Patient has prior history of Echocardiogram examinations, most recent 07/26/2018. Risk Factors:Dyslipidemia, Diabetes and HLD.  Sonographer:    Marygrace Drought RCS Referring Phys: [place]   1. Left ventricular ejection fraction, by estimation, is 60 to 65%. The left ventricle has normal function. The left ventricle has no regional wall motion abnormalities. Left ventricular diastolic parameters are consistent with Grade I diastolic dysfunction (impaired relaxation). The average left ventricular global longitudinal strain is -14.0 %. 2. Right ventricular systolic function is normal. The right ventricular size is normal. There is normal pulmonary artery systolic pressure. 3. Left atrial size was mildly dilated. 4. The mitral valve is normal in structure.  Trivial mitral valve regurgitation. Mild mitral stenosis. 5. The aortic valve is normal in structure. Aortic valve regurgitation is not visualized. No aortic stenosis is present.  FINDINGS Left Ventricle: Left ventricular ejection fraction, by estimation, is 60 to 65%. The left ventricle has normal function. The left ventricle has no regional wall motion abnormalities. The average left ventricular global longitudinal strain is -14.0 %. The left ventricular internal cavity size was normal in size. There is no left ventricular hypertrophy. Left ventricular diastolic parameters are consistent with Grade I diastolic dysfunction (impaired relaxation).  Right Ventricle: The right ventricular size is normal. No increase in right ventricular wall thickness. Right ventricular systolic function is normal. There is normal pulmonary artery systolic pressure. The tricuspid regurgitant velocity is 2.26 m/s, and with an assumed right atrial pressure of 3 mmHg, the estimated right ventricular systolic pressure is 73.5 mmHg.  Left Atrium: Left atrial size was mildly dilated.  Right Atrium: Right atrial size was normal in size.  Pericardium: There is no evidence of pericardial effusion.  Mitral Valve: The mitral valve is normal in structure. Mild to moderate mitral annular calcification. Trivial mitral valve regurgitation. Mild mitral valve stenosis. MV peak gradient, 11.3 mmHg. The mean mitral valve gradient is 4.0 mmHg.  Tricuspid Valve: The tricuspid valve is normal in structure. Tricuspid valve regurgitation is trivial.  Aortic Valve: The aortic valve is  normal in structure. Aortic valve regurgitation is not visualized. No aortic stenosis is present.  Pulmonic Valve: The pulmonic valve was normal in structure. Pulmonic valve regurgitation is not visualized. No evidence of pulmonic stenosis.  Aorta: The aortic root and ascending aorta are structurally normal, with no evidence of dilitation.  IAS/Shunts:  The atrial septum is grossly normal.   LEFT VENTRICLE PLAX 2D LVIDd:         3.80 cm  Diastology LVIDs:         2.70 cm  LV e' lateral:   5.22 cm/s LV PW:         1.10 cm  LV E/e' lateral: 24.5 LV IVS:        1.00 cm  LV e' medial:    4.35 cm/s LVOT diam:     1.80 cm  LV E/e' medial:  29.4 LV SV:         77 LV SV Index:   48       2D Longitudinal Strain LVOT Area:     2.54 cm 2D Strain GLS Avg:     -14.0 %   RIGHT VENTRICLE RV Basal diam:  3.00 cm RV S prime:     16.00 cm/s TAPSE (M-mode): 2.1 cm RVSP:           23.4 mmHg  LEFT ATRIUM             Index       RIGHT ATRIUM           Index LA diam:        4.10 cm 2.54 cm/m  RA Pressure: 3.00 mmHg LA Vol (A2C):   58.0 ml 36.00 ml/m RA Area:     12.10 cm LA Vol (A4C):   67.9 ml 42.14 ml/m RA Volume:   28.30 ml  17.56 ml/m LA Biplane Vol: 63.6 ml 39.47 ml/m AORTIC VALVE LVOT Vmax:   102.00 cm/s LVOT Vmean:  74.000 cm/s LVOT VTI:    0.303 m  AORTA Ao Root diam: 2.50 cm Ao Asc diam:  2.40 cm  MITRAL VALVE                TRICUSPID VALVE MV Area (PHT): 3.10 cm     TR Peak grad:   20.4 mmHg MV Peak grad:  11.3 mmHg    TR Vmax:        226.00 cm/s MV Mean grad:  4.0 mmHg     Estimated RAP:  3.00 mmHg MV Vmax:       1.68 m/s     RVSP:           23.4 mmHg MV Vmean:      88.9 cm/s MV Decel Time: 245 msec     SHUNTS MV E velocity: 128.00 cm/s  Systemic VTI:  0.30 m MV A velocity: 144.00 cm/s  Systemic Diam: 1.80 cm MV E/A ratio:  0.89  Mertie Moores MD Electronically signed by Mertie Moores MD Signature Date/Time: 10/07/2019/3:39:44 PM    Final     CT SCANS  CT CORONARY MORPH W/CTA COR W/SCORE 12/13/2021  Addendum 12/13/2021  2:48 PM ADDENDUM REPORT: 12/13/2021 14:46  EXAM: OVER-READ INTERPRETATION  CT CHEST  The following report is a limited chest CT over-read performed by radiologist Dr. Lindaann Slough [facility] on 12/13/2021. This over-read does not include interpretation of cardiac or  coronary anatomy or pathology. The coronary CTA interpretation by the cardiologist is attached.  COMPARISON:  Chest radiographs 02/04/2021.  Abdominal CT 08/27/2018.  FINDINGS: Mediastinum/Nodes: No enlarged lymph nodes within the visualized mediastinum.  Lungs/Pleura: There is no pleural effusion. Mild central airway thickening and subpleural reticulation at both lung bases. Mild perifissural nodularity along the minor fissure consistent with an intrapulmonary lymph node. No suspicious pulmonary nodule.  Upper abdomen: No significant findings in the visualized upper abdomen.  Musculoskeletal/Chest wall: No chest wall mass or suspicious osseous findings within the visualized chest. Multilevel thoracic spondylosis.  IMPRESSION: No significant extracardiac findings within the visualized chest.   Electronically Signed By: Richardean Sale M.D. On: 12/13/2021 14:46  Narrative CLINICAL DATA:  31F with atrial fibrillation and chest pain.  EXAM: Cardiac/Coronary  CT  TECHNIQUE: The patient was scanned on a [employer].  FINDINGS: A 120 kV prospective scan was triggered in the descending thoracic aorta at 111 HU's. Axial non-contrast 3 mm slices were carried out through the heart. The data set was analyzed on a dedicated work station and scored using the [place]. Gantry rotation speed was 250 msecs and collimation was .6 mm. No beta blockade and 0.8 mg of sl NTG was given. The 3D data set was reconstructed in 5% intervals of the 67-82 % of the R-R cycle. Diastolic phases were analyzed on a dedicated work station using MPR, MIP and VRT modes. The patient received 80 cc of contrast.  Aorta: Normal size. Ascending aorta 2.8 cm. Aortic atherosclerosis. No dissection.  Aortic Valve:  Trileaflet.  No calcifications.  Coronary Arteries:  Normal coronary origin.  Right dominance.  RCA is a large dominant artery that gives rise to PDA and PLVB. There is  minimal (<25%) plaque.  Left main is a large artery that gives rise to LAD, RI, and LCX arteries. There is moderate (50-69%) mixed plaque.  LAD has moderate (50-69%) mixed plaque in the mid vessel.  RI is a tiny vessel.  LCX is a small non-dominant artery that gives rise to one large OM1 branch. OM1 has mild (1-24%) mixed plaque proximally. There is severe (>70%) stenosis in the mid vessel. There was a PAC during image acquisition led to significant step artifact. This may explain the apparent stenosis in mid OM1.  Coronary Calcium Score:  Left main: 334  Left anterior descending artery: 39.6  Left circumflex artery: 39.6  Right coronary artery: 13.1  Total: 426  Percentile: Valid for age 55-84  Other findings:  Normal pulmonary vein drainage into the left atrium.  Normal let atrial appendage without a thrombus.  Normal size of the pulmonary artery.  Mitral annular calcification.  IMPRESSION: 1. Coronary calcium score of 426. Patient is out of the age range for comparison.  2. Normal coronary origin with right dominance.  3. There is moderate (50-69%) mixed plaque in the left main and LAD. Can not rule out severe (>70%) splenosis in OM1. However this is also near the region of a significant step artifact and may be artifactual. CAD RADS 4.  4.  Will send study for FFR CT.  Interpretation of noncardiac thoracic structures by radiology is pending.  Skeet Latch, MD  Electronically Signed: By: Skeet Latch M.D. On: 12/13/2021 13:53          EKG:11/2 NSR, narrow QRS at 70bpm (personally reviewed)  TELEMETRY: NSR, rates in 60-70s; no pauses (personally reviewed)  Device: [employer] ILR   Assessment/Plan: #) Sinus Node Dysfunction In review of ILR recording, does not appear to have p-waves preceding or during pause  Does not endorse syncope or pre-syncope, though  patient was unwell at the time Narrow QRS, Last echo 10/2019 - update echo  prior to device Meets criteria for PM, scheduled for this afternoon I reviewed risks/benefits of procedure - NPO for procedure  #) concern for paroxysmal AF No AF seen on ILR, no indication for [facility] at this time Will be able to continue to monitor for AF w PM  #) CAD Continue statin  #) T2DM Hypoglycemic currently, d5 ordered PRN  #) RA Managed by [facility] (Dr. Amil Amen) - takes Carita Pian   For questions or updates, please contact [place] Please consult [email or web address] for contact info under Cardiology/STEMI.  Signed, Mamie Levers, NP  01/05/2022 8:23 AM  EP Attending  Patient seen and examined. The patient has been admitted with symptomatic pauses and she is on no medical therapy. On exam she is a pleasant but frail appearing 88 yo woman with fairly severe changes from RA. Lungs are clear and CV with a RRR. Ext with no edema. Neuro is non-focal.  I have discussed the indications/risks/benefits/goals/expectations of DDD PM insertion and she wishes to proceed.  Carleene Overlie Vanetta Rule,MD

## 2022-01-06 ENCOUNTER — Inpatient Hospital Stay (HOSPITAL_COMMUNITY): Payer: Medicare Other

## 2022-01-06 LAB — GLUCOSE, CAPILLARY
Glucose-Capillary: 121 mg/dL — ABNORMAL HIGH (ref 70–99)
Glucose-Capillary: 128 mg/dL — ABNORMAL HIGH (ref 70–99)
Glucose-Capillary: 150 mg/dL — ABNORMAL HIGH (ref 70–99)
Glucose-Capillary: 81 mg/dL (ref 70–99)
Glucose-Capillary: 95 mg/dL (ref 70–99)

## 2022-01-06 LAB — BASIC METABOLIC PANEL
Anion gap: 6 (ref 5–15)
BUN: 23 mg/dL (ref 8–23)
CO2: 26 mmol/L (ref 22–32)
Calcium: 7.9 mg/dL — ABNORMAL LOW (ref 8.9–10.3)
Chloride: 108 mmol/L (ref 98–111)
Creatinine, Ser: 1.22 mg/dL — ABNORMAL HIGH (ref 0.44–1.00)
GFR, Estimated: 43 mL/min — ABNORMAL LOW (ref >60)
Glucose, Bld: 159 mg/dL — ABNORMAL HIGH (ref 70–99)
Potassium: 4.3 mmol/L (ref 3.5–5.1)
Sodium: 140 mmol/L (ref 135–145)

## 2022-01-06 MED ORDER — SODIUM CHLORIDE 0.9 % IV BOLUS
1000.0000 mL | Freq: Once | INTRAVENOUS | Status: AC
  Administered 2022-01-06: 1000 mL via INTRAVENOUS

## 2022-01-06 MED ORDER — ORAL CARE MOUTH RINSE: 15.0000 mL | OROMUCOSAL | Status: DC | PRN

## 2022-01-06 NOTE — TOC Transition Note (Addendum)
Transition of Care [facility]) - CM/SW Discharge Note   Patient Details  Name: Shelby Walker MRN: [id number] Date of Birth: 1933-11-10  Transition of Care [facility]) CM/SW Contact:  Zenon Mayo, RN Phone Number: 01/06/2022, 10:34 AM   Clinical Narrative:    From home, has no needs. DC on hold secondary to hypotension.          Patient Goals and CMS Choice        Discharge Placement                       Discharge Plan and Services                                     Social Determinants of Health (SDOH) Interventions     Readmission Risk Interventions     No data to display

## 2022-01-06 NOTE — Progress Notes (Signed)
After receiving IV fluids, patient reports feeling much better. Denies any further episodes of dizziness upon standing. BP improved to 124/50. RN took orthostatic vital signs and reported that vital signs did not show a significant drop in BP when going from laying--sitting--standing. Patient is eager to go home. I encouraged her to keep an eye on her BP and to be careful when going from sitting to standing. Discharge orders are placed.   Margie Billet, PA-C 01/06/2022 4:15 PM

## 2022-01-06 NOTE — Progress Notes (Addendum)
Paged by nurse regarding dizziness upon standing.  Orthostatic pressure was checked which was positive for orthostatic hypotension.  Blood pressure dropped from 117/45 to 86/46.  I spoke with both the patient and her daughter.  We will hold off on discharge at this point.  We will give 1 L of IV fluid running at 250 mL/h.  We will repeat orthostatic vital sign around 4 PM today.  If her blood pressure is better and that she is asymptomatic, she may still be discharged later today.  I have updated Dr. Curt Bears. Spoke with my colleague Vikki Ports PA-C who will follow up.

## 2022-01-06 NOTE — Discharge Summary (Addendum)
Discharge Summary    Patient ID: Shelby Walker MRN: [id number]; DOB: 1933/08/21  Admit date: 01/04/2022 Discharge date: 01/06/2022  PCP:  Reynold Bowen, [place] Providers Cardiologist:  Mertie Moores, MD  Electrophysiologist:  Virl Axe, MD       Discharge Diagnoses    Principal Problem:   Sinus pause Active Problems:   Diabetes mellitus ([place])   Paroxysmal atrial fibrillation [facility])   Essential hypertension   Uncontrolled type 2 diabetes mellitus with hyperglycemia ([place])   Rheumatoid arthritis ([place])   Stage 3b chronic kidney disease (CKD) ([place])   S/P transmetatarsal amputation of foot, right ([place])    Diagnostic Studies/Procedures    Echo 01/05/2022  1. Left ventricular ejection fraction, by estimation, is 60 to 65%. The  left ventricle has normal function. The left ventricle has no regional  wall motion abnormalities. Left ventricular diastolic parameters are  consistent with Grade I diastolic  dysfunction (impaired relaxation). The average left ventricular global  longitudinal strain is -12.5 %. The global longitudinal strain is  abnormal.   2. Right ventricular systolic function is normal. The right ventricular  size is normal. Tricuspid regurgitation signal is inadequate for assessing  PA pressure.   3. Left atrial size was severely dilated.   4. The mitral valve is normal in structure. Mild mitral valve  regurgitation. No evidence of mitral stenosis. Moderate mitral annular  calcification.   5. The aortic valve is normal in structure. Aortic valve regurgitation is  trivial. Aortic valve sclerosis/calcification is present, without any  evidence of aortic stenosis.   6. The inferior vena cava is normal in size with greater than 50%  respiratory variability, suggesting right atrial pressure of 3 mmHg.    Pacemaker implantation by Dr. Lovena Le 01/05/2022  CONCLUSIONS:   1. Successful implantation of a Medtronic dual-chamber pacemaker for  symptomatic bradycardia due to sinus node dysfunction.  2. No early apparent complications.   Medtronic (serial number [id number]) pacemaker  medtronic (serial number [id number]) right atrial lead  medtornic (serial number [id number]) right ventricular lead  _____________   History of Present Illness     Shelby Walker is a 88 y.o. female with CAD, diastolic function, L5QG, HLD, P A-fib, PAD, CKD, RA who is being seen 01/04/2022 for the evaluation of sinus pauses. She has been undergoing evaluation for atrial fibrillation with an implantable loop recorder and on 10/17 had an episode of sinus pause that lasted 25 seconds.  She states that she was asymptomatic however unclear and no one was with her at the time.  During that time she had been quite ill with COVID infection and was prescribed antiviral and prednisone.  She has recovered since then.  The implantable loop recorder strips was reviewed by her cardiologist and confirmed, though she was recommended to come into the emergency department and be hospitalized for a pacemaker placement.  EP is aware that she is here.   Hospital Course     Consultants: N/A   Patient was admitted due to sinus pauses that lasted 25 seconds.  She was previously seen by Dr. Caryl Comes for management of A-fib.  During the admission, she was seen by Dr. Lovena Le of electrophysiology service who felt she is appropriate candidate for pacemaker implantation.  Echocardiogram obtained on 01/05/2022 showed EF 60 to 65%, grade 1 DD, mild MR, moderate mitral annular calcification, trivial AI.  Patient underwent successful implantation of Medtronic dual-chamber pacemaker for symptomatic bradycardia on 01/05/2022.  She was seen  in the morning of 01/06/2022 by Dr. Curt Bears at which time she was doing well.  Chest x-ray shows no pneumothorax or acute complication.  Patient is deemed stable for discharge from the cardiac perspective.  Follow-up has been arranged with wound clinic and Dr.  Lovena Le.      Did the patient have an acute coronary syndrome (MI, NSTEMI, STEMI, etc) this admission?:  No                               Did the patient have a percutaneous coronary intervention (stent / angioplasty)?:  No.          _____________  Discharge Vitals Blood pressure (!) 101/43, pulse 73, temperature 98.4 F (36.9 C), temperature source Oral, resp. rate 13, height 5' 1.5" (1.562 m), weight 50.3 kg, SpO2 97 %.  Filed Weights   01/05/22 1925 01/05/22 1940 01/06/22 0423  Weight: 50.5 kg 50.5 kg 50.3 kg   Physical Exam   GEN: No acute distress.   Neck: No JVD Cardiac: RRR, no murmurs, rubs, or gallops.  Pacemaker site clean, dry, without significant bleeding Respiratory: Clear to auscultation bilaterally. GI: Soft, nontender, non-distended  MS: No edema; No deformity. Neuro:  Nonfocal  Psych: Normal affect    Labs & Radiologic Studies    CBC Recent Labs    01/04/22 1729 01/05/22 0437  WBC 7.2 7.0  NEUTROABS 5.3 4.1  HGB 10.2* 9.6*  HCT 31.8* 30.2*  MCV 92.2 91.2  PLT 258 062   Basic Metabolic Panel Recent Labs    01/04/22 1729 01/05/22 0437 01/06/22 0047  NA 141 141 140  K 4.8 3.8 4.3  CL 107 108 108  CO2 '24 26 26  '$ GLUCOSE 286* 88 159*  BUN 35* 29* 23  CREATININE 1.33* 1.11* 1.22*  CALCIUM 8.6* 8.3* 7.9*  MG 2.2  --   --    Liver Function Tests No results for input(s): "AST", "ALT", "ALKPHOS", "BILITOT", "PROT", "ALBUMIN" in the last 72 hours. No results for input(s): "LIPASE", "AMYLASE" in the last 72 hours. High Sensitivity Troponin:   No results for input(s): "TROPONINIHS" in the last 720 hours.  BNP Invalid input(s): "POCBNP" D-Dimer No results for input(s): "DDIMER" in the last 72 hours. Hemoglobin A1C Recent Labs    01/05/22 0437  HGBA1C 7.7*   Fasting Lipid Panel Recent Labs    01/05/22 0437  CHOL 149  HDL 50  LDLCALC 71  TRIG 138  CHOLHDL 3.0   Thyroid Function Tests No results for input(s): "TSH", "T4TOTAL",  "T3FREE", "THYROIDAB" in the last 72 hours.  Invalid input(s): "FREET3" _____________  DG Chest 2 View  Result Date: 01/06/2022 CLINICAL DATA:  Pacemaker placement EXAM: CHEST - 2 VIEW COMPARISON:  01/04/2022 FINDINGS: The cardiomediastinal silhouette is unremarkable. LEFT subclavian pacemaker noted with lead tips overlying the RIGHT atrium and RIGHT ventricle. Very mild LEFT basilar atelectasis noted. There is no evidence of focal airspace disease, pulmonary edema, suspicious pulmonary nodule/mass, pleural effusion, or pneumothorax. No acute bony abnormalities are identified. Remote RIGHT rib fractures again noted. IMPRESSION: LEFT subclavian pacemaker placement without pneumothorax. Minimal LEFT basilar atelectasis. Electronically Signed   By: Margarette Canada M.D.   On: 01/06/2022 09:31   EP PPM/ICD IMPLANT  Result Date: 01/05/2022 CONCLUSIONS:  1. Successful implantation of a Medtronic dual-chamber pacemaker for symptomatic bradycardia due to sinus node dysfunction.  2. No early apparent complications.       Cristopher Peru,  MD 01/05/2022 8:48 PM   ECHOCARDIOGRAM COMPLETE  Result Date: 01/05/2022    ECHOCARDIOGRAM REPORT   Patient Name:   Shelby Walker Date of Exam: 01/05/2022 Medical Rec #:  [id number]   Height:       62.5 in Accession #:    [id number]  Weight:       118.0 lb Date of Birth:  Aug 25, 1933   BSA:          1.537 m Patient Age:    6 years    BP:           141/63 mmHg Patient Gender: F           HR:           65 bpm. Exam Location:  Inpatient Procedure: 2D Echo, Cardiac Doppler, Color Doppler, 3D Echo and Strain Analysis Indications:    Heart block, Complete I44.2  History:        Patient has prior history of Echocardiogram examinations, most                 recent 07/26/2018. CAD and Angina, PAD, Mitral Valve Disease,                 Signs/Symptoms:Chest Pain, Dyspnea and Fatigue; Risk                 Factors:Diabetes, Dyslipidemia, Hypertension and Non-Smoker.  Sonographer:    Greer Pickerel  Referring Phys: Mamie Levers  Sonographer Comments: Image acquisition challenging due to respiratory motion. Global longitudinal strain was attempted. IMPRESSIONS  1. Left ventricular ejection fraction, by estimation, is 60 to 65%. The left ventricle has normal function. The left ventricle has no regional wall motion abnormalities. Left ventricular diastolic parameters are consistent with Grade I diastolic dysfunction (impaired relaxation). The average left ventricular global longitudinal strain is -12.5 %. The global longitudinal strain is abnormal.  2. Right ventricular systolic function is normal. The right ventricular size is normal. Tricuspid regurgitation signal is inadequate for assessing PA pressure.  3. Left atrial size was severely dilated.  4. The mitral valve is normal in structure. Mild mitral valve regurgitation. No evidence of mitral stenosis. Moderate mitral annular calcification.  5. The aortic valve is normal in structure. Aortic valve regurgitation is trivial. Aortic valve sclerosis/calcification is present, without any evidence of aortic stenosis.  6. The inferior vena cava is normal in size with greater than 50% respiratory variability, suggesting right atrial pressure of 3 mmHg. FINDINGS  Left Ventricle: Left ventricular ejection fraction, by estimation, is 60 to 65%. The left ventricle has normal function. The left ventricle has no regional wall motion abnormalities. The average left ventricular global longitudinal strain is -12.5 %. The global longitudinal strain is abnormal. The left ventricular internal cavity size was normal in size. There is no left ventricular hypertrophy. Left ventricular diastolic parameters are consistent with Grade I diastolic dysfunction (impaired relaxation). Right Ventricle: The right ventricular size is normal. Right ventricular systolic function is normal. Tricuspid regurgitation signal is inadequate for assessing PA pressure. The tricuspid regurgitant velocity  is 2.11 m/s, and with an assumed right atrial  pressure of 8 mmHg, the estimated right ventricular systolic pressure is 62.9 mmHg. Left Atrium: Left atrial size was severely dilated. Right Atrium: Right atrial size was normal in size. Pericardium: There is no evidence of pericardial effusion. Mitral Valve: The mitral valve is normal in structure. Moderate mitral annular calcification. Mild mitral valve regurgitation. No evidence of mitral valve stenosis. Tricuspid Valve: The  tricuspid valve is normal in structure. Tricuspid valve regurgitation is trivial. No evidence of tricuspid stenosis. Aortic Valve: The aortic valve is normal in structure. Aortic valve regurgitation is trivial. Aortic valve sclerosis/calcification is present, without any evidence of aortic stenosis. Pulmonic Valve: The pulmonic valve was normal in structure. Pulmonic valve regurgitation is not visualized. No evidence of pulmonic stenosis. Aorta: The aortic root is normal in size and structure. Venous: The inferior vena cava is normal in size with greater than 50% respiratory variability, suggesting right atrial pressure of 3 mmHg. IAS/Shunts: No atrial level shunt detected by color flow Doppler.  LEFT VENTRICLE PLAX 2D LVIDd:         3.60 cm   Diastology LVIDs:         2.40 cm   LV e' medial:    5.55 cm/s LV PW:         1.00 cm   LV E/e' medial:  19.1 LV IVS:        0.90 cm   LV e' lateral:   5.11 cm/s LVOT diam:     1.60 cm   LV E/e' lateral: 20.7 LV SV:         48 LV SV Index:   31        2D Longitudinal Strain LVOT Area:     2.01 cm  2D Strain GLS Avg:     -12.5 %                           3D Volume EF:                          3D EF:        50 %                          LV EDV:       94 ml                          LV ESV:       47 ml                          LV SV:        46 ml RIGHT VENTRICLE RV S prime:     14.60 cm/s TAPSE (M-mode): 1.9 cm LEFT ATRIUM             Index        RIGHT ATRIUM           Index LA diam:        4.10 cm 2.67  cm/m   RA Area:     16.20 cm LA Vol (A2C):   69.3 ml 45.09 ml/m  RA Volume:   42.00 ml  27.33 ml/m LA Vol (A4C):   72.1 ml 46.92 ml/m LA Biplane Vol: 75.3 ml 49.00 ml/m  AORTIC VALVE LVOT Vmax:   101.00 cm/s LVOT Vmean:  69.700 cm/s LVOT VTI:    0.240 m  AORTA Ao Root diam: 2.60 cm Ao Asc diam:  3.10 cm MITRAL VALVE                TRICUSPID VALVE MV Area (PHT): 2.62 cm     TR Peak grad:   17.8 mmHg MV Decel Time: 289 msec  TR Vmax:        211.00 cm/s MV E velocity: 106.00 cm/s MV A velocity: 145.00 cm/s  SHUNTS MV E/A ratio:  0.73         Systemic VTI:  0.24 m                             Systemic Diam: 1.60 cm Kirk Ruths MD Electronically signed by Kirk Ruths MD Signature Date/Time: 01/05/2022/4:33:54 PM    Final    DG Chest 2 View  Result Date: 01/04/2022 CLINICAL DATA:  Palp[place] EXAM: CHEST - 2 VIEW COMPARISON:  02/04/2021 FINDINGS: Tiny right pleural effusion or pleural thickening. No focal airspace disease. Normal cardiac size. Aortic atherosclerosis. Electronic recording device over left chest. IMPRESSION: Tiny right pleural effusion or pleural thickening. Electronically Signed   By: Donavan Foil M.D.   On: 01/04/2022 18:09   CUP PACEART REMOTE DEVICE CHECK  Result Date: 12/29/2021 ILR summary report received. Battery status OK. Normal device function. No new symptom, tachy, brady episodes. No new AF episodes. 2 pause episodes, 1 undersensing. 1 true pause episode on 12/19/21, duration 25 seconds. Occurred at 12:23PM. (EGM suspended 37 seconds total) Routing for review  Monthly summary reports and ROV/PRN  CT CORONARY MORPH W/CTA COR W/SCORE W/CA W/CM &/OR WO/CM  Addendum Date: 12/13/2021   ADDENDUM REPORT: 12/13/2021 14:46 EXAM: OVER-READ INTERPRETATION  CT CHEST The following report is a limited chest CT over-read performed by radiologist Dr. Lindaann Slough [facility] on 12/13/2021. This over-read does not include interpretation of cardiac or coronary anatomy  or pathology. The coronary CTA interpretation by the cardiologist is attached. COMPARISON:  Chest radiographs 02/04/2021.  Abdominal CT 08/27/2018. FINDINGS: Mediastinum/Nodes: No enlarged lymph nodes within the visualized mediastinum. Lungs/Pleura: There is no pleural effusion. Mild central airway thickening and subpleural reticulation at both lung bases. Mild perifissural nodularity along the minor fissure consistent with an intrapulmonary lymph node. No suspicious pulmonary nodule. Upper abdomen: No significant findings in the visualized upper abdomen. Musculoskeletal/Chest wall: No chest wall mass or suspicious osseous findings within the visualized chest. Multilevel thoracic spondylosis. IMPRESSION: No significant extracardiac findings within the visualized chest. Electronically Signed   By: Richardean Sale M.D.   On: 12/13/2021 14:46   Result Date: 12/13/2021 CLINICAL DATA:  36F with atrial fibrillation and chest pain. EXAM: Cardiac/Coronary  CT TECHNIQUE: The patient was scanned on a [employer]. FINDINGS: A 120 kV prospective scan was triggered in the descending thoracic aorta at 111 HU's. Axial non-contrast 3 mm slices were carried out through the heart. The data set was analyzed on a dedicated work station and scored using the [employer]. Gantry rotation speed was 250 msecs and collimation was .6 mm. No beta blockade and 0.8 mg of sl NTG was given. The 3D data set was reconstructed in 5% intervals of the 67-82 % of the R-R cycle. Diastolic phases were analyzed on a dedicated work station using MPR, MIP and VRT modes. The patient received 80 cc of contrast. Aorta: Normal size. Ascending aorta 2.8 cm. Aortic atherosclerosis. No dissection. Aortic Valve:  Trileaflet.  No calcifications. Coronary Arteries:  Normal coronary origin.  Right dominance. RCA is a large dominant artery that gives rise to PDA and PLVB. There is minimal (<25%) plaque. Left main is a large artery that gives rise to  LAD, RI, and LCX arteries. There is moderate (50-69%) mixed plaque. LAD has moderate (50-69%) mixed plaque in  the mid vessel. RI is a tiny vessel. LCX is a small non-dominant artery that gives rise to one large OM1 branch. OM1 has mild (1-24%) mixed plaque proximally. There is severe (>70%) stenosis in the mid vessel. There was a PAC during image acquisition led to significant step artifact. This may explain the apparent stenosis in mid OM1. Coronary Calcium Score: Left main: 334 Left anterior descending artery: 39.6 Left circumflex artery: 39.6 Right coronary artery: 13.1 Total: 426 Percentile: Valid for age 67-84 Other findings: Normal pulmonary vein drainage into the left atrium. Normal let atrial appendage without a thrombus. Normal size of the pulmonary artery. Mitral annular calcification. IMPRESSION: 1. Coronary calcium score of 426. Patient is out of the age range for comparison. 2. Normal coronary origin with right dominance. 3. There is moderate (50-69%) mixed plaque in the left main and LAD. Can not rule out severe (>70%) splenosis in OM1. However this is also near the region of a significant step artifact and may be artifactual. CAD RADS 4. 4.  Lovelee Forner send study for FFR CT. Interpretation of noncardiac thoracic structures by radiology is pending. Skeet Latch, MD Electronically Signed: By: Skeet Latch M.D. On: 12/13/2021 13:53   CT CORONARY FRACTIONAL FLOW RESERVE DATA PREP  Result Date: 12/13/2021 EXAM: CT FFR ANALYSIS CLINICAL DATA:  75F with abnormal coronary CT-A. FINDINGS: FFRct analysis was performed on the original cardiac CT angiogram dataset. Diagrammatic representation of the FFRct analysis is provided in a separate PDF document in PACS. This dictation was created using the PDF document and an interactive 3D model of the results. 3D model is not available in the EMR/PACS. Normal FFR range is >0.80. 1. Left Main: FFRct 0l97.  No significant stenosis. 2. LAD: FFRct 0.92 proximal,  0.83 mid, 0.75 distal. No significant stenosis. 3. LCX: D1 FFRct 0.94 proximal, 0.61 mid. Consistent with significant stenosis. 4. RCA: FFRct 0.98 proximal, 0.91 mid, 0.86 distal. No significant stenosis. IMPRESSION: 1.  CT FFR analysis consistent with abnormal flow in D1. 2. Consider aggressive medical management vs further investigation with cardiac catheterization if appropriate. Tiffany C. Oval Linsey, MD Electronically Signed   By: Skeet Latch M.D.   On: 12/13/2021 14:02   CT CORONARY FRACTIONAL FLOW RESERVE FLUID ANALYSIS  Result Date: 12/13/2021 EXAM: CT FFR ANALYSIS CLINICAL DATA:  75F with abnormal coronary CT-A. FINDINGS: FFRct analysis was performed on the original cardiac CT angiogram dataset. Diagrammatic representation of the FFRct analysis is provided in a separate PDF document in PACS. This dictation was created using the PDF document and an interactive 3D model of the results. 3D model is not available in the EMR/PACS. Normal FFR range is >0.80. 1. Left Main: FFRct 0l97.  No significant stenosis. 2. LAD: FFRct 0.92 proximal, 0.83 mid, 0.75 distal. No significant stenosis. 3. LCX: D1 FFRct 0.94 proximal, 0.61 mid. Consistent with significant stenosis. 4. RCA: FFRct 0.98 proximal, 0.91 mid, 0.86 distal. No significant stenosis. IMPRESSION: 1.  CT FFR analysis consistent with abnormal flow in D1. 2. Consider aggressive medical management vs further investigation with cardiac catheterization if appropriate. Tiffany C. Oval Linsey, MD Electronically Signed   By: Skeet Latch M.D.   On: 12/13/2021 14:02   Disposition   Pt is being discharged home today in good condition.  Follow-up Plans & Appointments     Follow-up [facility] Follow up on 01/18/2022.   Specialty: Cardiology Why: @ 2:40PM. Wound check Contact  information: [address], [place] [phone] [place] [phone]        Nahser, Wonda Cheng, MD Follow up on 02/21/2022.   Specialty: Cardiology Why: 11:20AM. Cardiology follow up Contact information: [place] [address] [phone]         Evans Lance, MD Follow up on 04/10/2022.   Specialty: Cardiology Why: 2:30PM. Pacemaker follow up Contact information: [address] [place] [address] [phone]                   Discharge Medications   Allergies as of 01/06/2022       Reactions   Cholestyramine    Possible- Makes throat burn. Patient said she can't take it    Compazine [prochlorperazine Edisylate] Swelling   REACTION: " tongue swell and unable to swallow"   Sulfonamide Derivatives Other (See Comments)   REACTION: " broke out with fine itching bumps"   Hydrocodone Other (See Comments)   Infliximab Other (See Comments)   Other reaction(s): rash   Leflunomide    Other reaction(s): diarrhea   Prochlorperazine Other (See Comments)   Other reaction(s): Unknown   Rosuvastatin    Other reaction(s): muscle aches   Sulfa Antibiotics Other (See Comments)   Other reaction(s): tongue swelling        Medication List     STOP taking these medications    metoprolol tartrate 100 MG tablet Commonly known as: LOPRESSOR       TAKE these medications    acetaminophen 650 MG CR tablet Commonly known as: TYLENOL Take 1,300 mg by mouth every 8 (eight) hours as needed for pain.   acetaminophen-codeine 300-30 MG tablet Commonly known as: TYLENOL #3 Take 1 tablet by mouth in the morning, at noon, and at bedtime. What changed:  when to take this reasons to take this   atorvastatin 20 MG tablet Commonly known as: LIPITOR Take 1 tablet (20 mg total) by mouth daily.   Calcium Carbonate-Vitamin D 600-400 MG-UNIT tablet Take 1 tablet by mouth daily at 12 noon.   carvedilol 6.25 MG tablet Commonly known as: COREG TAKE 1 TABLET BY MOUTH TWICE  DAILY WITH A MEAL   CENTRUM SILVER 50+WOMEN PO Take 1 tablet by mouth daily.   diclofenac Sodium 1 % Gel Commonly known as: VOLTAREN Apply 2 g topically 4 (four) times daily.   Ensure Active Liqd Take 237 mLs by mouth in the morning. Prefers Chocolate   esomeprazole 20 MG capsule Commonly known as: NEXIUM Take 20 mg by mouth in the morning.   gabapentin 100 MG capsule Commonly known as: NEURONTIN Take 1 capsule (100 mg total) by mouth 3 (three) times daily.   insulin aspart 100 UNIT/ML FlexPen Commonly known as: NOVOLOG Inject 3 Units into the skin 3 (three) times daily with meals. Sliding scale What changed: how much to take   isosorbide mononitrate 30 MG 24 hr tablet Commonly known as: IMDUR Take 1 tablet (30 mg total) by mouth daily.   lip balm ointment Apply topically as needed for lip care.   liver oil-zinc oxide 40 % ointment Commonly known as: DESITIN Apply 1 application topically 3 (three) times daily. To raw areas in between/on buttocks   nitroGLYCERIN 0.4 MG SL tablet Commonly known as: NITROSTAT Dissolve 1 tablet under the tongue every 5 minutes as needed for chest pain. Max of 3 doses, then 911.   predniSONE 1 MG tablet Commonly  known as: DELTASONE Take 4 mg by mouth in the morning.   Restasis 0.05 % ophthalmic emulsion Generic drug: cycloSPORINE Place 1 drop into both eyes 2 (two) times daily.   saccharomyces boulardii 250 MG capsule Commonly known as: FLORASTOR Take 250 mg by mouth 2 (two) times daily.   S[place]un Blue Dry Scalp 1 % shampoo Generic drug: pyrithione zinc Apply 1 application topically once a week.   traMADol 50 MG tablet Commonly known as: ULTRAM Take 50 mg by mouth 2 (two) times daily as needed.   TRESIBA [place] Inject 6-8 Units into the skin at bedtime.           Outstanding Labs/Studies   N/A  Duration of Discharge Encounter   Greater than 30 minutes including physician time.  Hilbert Corrigan, PA 01/06/2022, 10:18  AM    I have seen and examined this patient with Almyra Deforest.  Agree with above, note added to reflect my findings.  On exam, RRR, no murmurs, lungs clear.  She is now status post Medtronic pacemaker for sinus pauses.  Device functioning appropriately.  Chest x-ray and interrogation without issue.  Plan for discharge today with follow-up in device clinic.  Jahniyah Revere M. Momin Misko MD 01/06/2022 10:22 AM

## 2022-01-08 ENCOUNTER — Encounter (HOSPITAL_COMMUNITY): Payer: Self-pay | Admitting: Internal Medicine

## 2022-01-08 ENCOUNTER — Telehealth: Payer: Self-pay | Admitting: Internal Medicine

## 2022-01-08 MED FILL — Heparin Sod (Porcine)-NaCl IV Soln 1000 Unit/500ML-0.9%: INTRAVENOUS | Qty: 500 | Status: AC

## 2022-01-08 NOTE — Telephone Encounter (Signed)
Pt c/o medication issue:  1. Name of Medication: Imdur   2. How are you currently taking this medication (dosage and times per day)? Not currently taking   3. Are you having a reaction (difficulty breathing--STAT)? No   4. What is your medication issue? Daughter is calling stating patient never started this medication Dr. Acie Fredrickson wanted to start her on due to her CT results because of her BP being so low in the hospital. She is also calling because the patient was supposed to have a rheumatoid arthritis transfusion today, but it was cancelled due to the patient being on antibiotics for the pacemaker implant. She was advised to call our office to see when it can be rescheduled for. Please advise.

## 2022-01-08 NOTE — Telephone Encounter (Signed)
Spoke with pt's daughter, Lisa,DPR who reports pt still has not started Imdur due to low BP.  Pt's daughter states BP remains in the low 100's/50-60.  Pt was due to have Simponi Aria infusion today and it had to be held due to pt receiving antibiotics while admitted to the hospital.  Rheumatology office would like to know when it will be safe to reschedule infusion due to pacemaker. Pt's daughter advised will forward to Dr Acie Fredrickson and his nurse re: Imdur as well as  Dr Lovena Le for further advisement of infusion safety.  Pt's daughter verbalizes understanding and agrees with current plan.

## 2022-01-08 NOTE — Telephone Encounter (Signed)
Spoke with Dr Acie Fredrickson who understands the patient has discontinued/never taken the isosorbide. He still elects not to do a cath because he feels it's too risky. She has appt scheduled for next month and will discuss further at that time.

## 2022-01-11 ENCOUNTER — Telehealth: Payer: Self-pay | Admitting: Internal Medicine

## 2022-01-11 NOTE — Telephone Encounter (Signed)
Pt daughter called and wanted to schedule a rhematoid arthritis infusion for her mother, who had a MDT PPM placed with Dr. Lovena Le.  ( The week of 01/15/2022. )  The Rheumatologist wanted to make sure Pt was ok to do infusion per Dr. Tanna Furry approval.   Dr. Lovena Le out of the office the rest of this week.    Pt is scheduled for device clinic wound check on 01/18/2022 at [place] on [address].  Dr. Lovena Le will be present 11/16 and 11/17 that week.   Pt daughter [name] called [phone], voicemail left, and told ok to schedule if after 11/16 appointment at [facility] or on 11/17.  An answer regarding their question should be obtainable at that time.        Pt daughter [name] also had question for Dr. Acie Fredrickson and RN regarding Isosorbide start up.  Pt is NOT currently taking this med, and was stopped prior to MDT PPM placement.  Pt was hypotensive in the hosptial, and current BP's are Low 828'M systolic, and 03-49'Z diastolic.    Will forward this medication question.

## 2022-01-11 NOTE — Telephone Encounter (Signed)
Pt c/o medication issue:  1. Name of Medication: Isosorbide  2. How are you currently taking this medication (dosage and times per day)?   3. Are you having a reaction (difficulty breathing--STAT)? NO  4. What is your medication issue? Daughter calling to f/u on medication per her call on 11/6. Daughter states that she was to receive a callback regarding medication as well as pt being cleared to have Infusion. Please advise

## 2022-01-12 NOTE — Telephone Encounter (Signed)
Returned call to daughter [name] to inform her that Nahser understands she's not taking the isosorbide d/t low BP considerations, and he will discuss this more next month at their visit. Appreciative of call.

## 2022-01-18 ENCOUNTER — Ambulatory Visit: Payer: Medicare Other | Attending: Cardiovascular Disease

## 2022-01-18 DIAGNOSIS — I455 Other specified heart block: Secondary | ICD-10-CM | POA: Insufficient documentation

## 2022-01-18 LAB — CUP PACEART INCLINIC DEVICE CHECK
Battery Remaining Longevity: 184 mo
Battery Voltage: 3.23 V
Brady Statistic AP VP Percent: 0 %
Brady Statistic AP VS Percent: 0.12 %
Brady Statistic AS VP Percent: 0.03 %
Brady Statistic AS VS Percent: 99.85 %
Brady Statistic RA Percent Paced: 0.12 %
Brady Statistic RV Percent Paced: 0.03 %
Date Time Interrogation Session: 20231116151139
Implantable Lead Connection Status: 753985
Implantable Lead Connection Status: 753985
Implantable Lead Implant Date: 20231103
Implantable Lead Implant Date: 20231103
Implantable Lead Location: 753859
Implantable Lead Location: 753860
Implantable Lead Model: 5076
Implantable Lead Model: 5076
Implantable Pulse Generator Implant Date: 20231103
Lead Channel Impedance Value: 380 Ohm
Lead Channel Impedance Value: 399 Ohm
Lead Channel Impedance Value: 437 Ohm
Lead Channel Impedance Value: 570 Ohm
Lead Channel Pacing Threshold Amplitude: 0.5 V
Lead Channel Pacing Threshold Amplitude: 0.75 V
Lead Channel Pacing Threshold Pulse Width: 0.4 ms
Lead Channel Pacing Threshold Pulse Width: 0.4 ms
Lead Channel Sensing Intrinsic Amplitude: 5.25 mV
Lead Channel Sensing Intrinsic Amplitude: 5.25 mV
Lead Channel Sensing Intrinsic Amplitude: 6.5 mV
Lead Channel Sensing Intrinsic Amplitude: 7.625 mV
Lead Channel Setting Pacing Amplitude: 3.5 V
Lead Channel Setting Pacing Amplitude: 3.5 V
Lead Channel Setting Pacing Pulse Width: 0.4 ms
Lead Channel Setting Sensing Sensitivity: 1.2 mV
Zone Setting Status: 755011

## 2022-01-18 NOTE — Progress Notes (Signed)
Wound check appointment. Steri-strips removed. Wound without redness or edema. Incision edges approximated, wound well healed. Normal device function. Thresholds, sensing, and impedances consistent with implant measurements. Device programmed at 3.5V/auto capture programmed on for extra safety margin until 3 month visit. Histogram distribution appropriate for patient and level of activity. No mode switches or high ventricular rates noted. Patient educated about wound care, arm mobility, lifting restrictions. ROV in 3 months with implanting physician. 

## 2022-01-18 NOTE — Patient Instructions (Signed)

## 2022-01-19 ENCOUNTER — Ambulatory Visit: Payer: Medicare Other | Admitting: Podiatry

## 2022-01-19 DIAGNOSIS — Z79899 Other long term (current) drug therapy: Secondary | ICD-10-CM | POA: Diagnosis not present

## 2022-01-19 DIAGNOSIS — R5383 Other fatigue: Secondary | ICD-10-CM | POA: Diagnosis not present

## 2022-01-19 DIAGNOSIS — M069 Rheumatoid arthritis, unspecified: Secondary | ICD-10-CM | POA: Diagnosis not present

## 2022-01-19 NOTE — Telephone Encounter (Signed)
Discussed infusions with Dr. Lovena Le.  Per Dr. Jarold Motto is ok to continue with these infusions.  Pt and daughter aware.

## 2022-01-24 ENCOUNTER — Emergency Department (HOSPITAL_COMMUNITY): Payer: Medicare Other

## 2022-01-24 ENCOUNTER — Inpatient Hospital Stay (HOSPITAL_COMMUNITY): Payer: Medicare Other

## 2022-01-24 ENCOUNTER — Inpatient Hospital Stay (HOSPITAL_COMMUNITY)
Admission: EM | Admit: 2022-01-24 | Discharge: 2022-02-02 | DRG: 260 | Disposition: A | Discharge status: Rehabilitation Facility | Payer: Medicare Other | Attending: Internal Medicine | Admitting: Internal Medicine

## 2022-01-24 ENCOUNTER — Other Ambulatory Visit: Payer: Self-pay

## 2022-01-24 ENCOUNTER — Encounter (HOSPITAL_COMMUNITY): Payer: Self-pay

## 2022-01-24 DIAGNOSIS — N12 Tubulo-interstitial nephritis, not specified as acute or chronic: Secondary | ICD-10-CM | POA: Diagnosis not present

## 2022-01-24 DIAGNOSIS — I129 Hypertensive chronic kidney disease with stage 1 through stage 4 chronic kidney disease, or unspecified chronic kidney disease: Secondary | ICD-10-CM | POA: Diagnosis not present

## 2022-01-24 DIAGNOSIS — R7881 Bacteremia: Secondary | ICD-10-CM | POA: Diagnosis not present

## 2022-01-24 DIAGNOSIS — E876 Hypokalemia: Secondary | ICD-10-CM | POA: Diagnosis present

## 2022-01-24 DIAGNOSIS — I10 Essential (primary) hypertension: Secondary | ICD-10-CM | POA: Diagnosis not present

## 2022-01-24 DIAGNOSIS — R001 Bradycardia, unspecified: Secondary | ICD-10-CM

## 2022-01-24 DIAGNOSIS — I700 Atherosclerosis of aorta: Secondary | ICD-10-CM | POA: Diagnosis not present

## 2022-01-24 DIAGNOSIS — Z1152 Encounter for screening for COVID-19: Secondary | ICD-10-CM

## 2022-01-24 DIAGNOSIS — Z823 Family history of stroke: Secondary | ICD-10-CM | POA: Diagnosis not present

## 2022-01-24 DIAGNOSIS — E1122 Type 2 diabetes mellitus with diabetic chronic kidney disease: Secondary | ICD-10-CM | POA: Diagnosis not present

## 2022-01-24 DIAGNOSIS — N170 Acute kidney failure with tubular necrosis: Secondary | ICD-10-CM

## 2022-01-24 DIAGNOSIS — E1169 Type 2 diabetes mellitus with other specified complication: Secondary | ICD-10-CM | POA: Diagnosis not present

## 2022-01-24 DIAGNOSIS — N179 Acute kidney failure, unspecified: Secondary | ICD-10-CM

## 2022-01-24 DIAGNOSIS — M4626 Osteomyelitis of vertebra, lumbar region: Secondary | ICD-10-CM

## 2022-01-24 DIAGNOSIS — B9561 Methicillin susceptible Staphylococcus aureus infection as the cause of diseases classified elsewhere: Secondary | ICD-10-CM

## 2022-01-24 DIAGNOSIS — I5032 Chronic diastolic (congestive) heart failure: Secondary | ICD-10-CM | POA: Diagnosis present

## 2022-01-24 DIAGNOSIS — I959 Hypotension, unspecified: Secondary | ICD-10-CM | POA: Diagnosis not present

## 2022-01-24 DIAGNOSIS — E440 Moderate protein-calorie malnutrition: Secondary | ICD-10-CM | POA: Diagnosis not present

## 2022-01-24 DIAGNOSIS — Z66 Do not resuscitate: Secondary | ICD-10-CM | POA: Diagnosis not present

## 2022-01-24 DIAGNOSIS — A4101 Sepsis due to Methicillin susceptible Staphylococcus aureus: Secondary | ICD-10-CM | POA: Diagnosis present

## 2022-01-24 DIAGNOSIS — D649 Anemia, unspecified: Secondary | ICD-10-CM | POA: Diagnosis not present

## 2022-01-24 DIAGNOSIS — I130 Hypertensive heart and chronic kidney disease with heart failure and stage 1 through stage 4 chronic kidney disease, or unspecified chronic kidney disease: Secondary | ICD-10-CM | POA: Diagnosis present

## 2022-01-24 DIAGNOSIS — R000 Tachycardia, unspecified: Secondary | ICD-10-CM | POA: Diagnosis not present

## 2022-01-24 DIAGNOSIS — J9601 Acute respiratory failure with hypoxia: Secondary | ICD-10-CM | POA: Diagnosis present

## 2022-01-24 DIAGNOSIS — I38 Endocarditis, valve unspecified: Secondary | ICD-10-CM | POA: Diagnosis not present

## 2022-01-24 DIAGNOSIS — I2489 Other forms of acute ischemic heart disease: Secondary | ICD-10-CM | POA: Diagnosis present

## 2022-01-24 DIAGNOSIS — Z794 Long term (current) use of insulin: Secondary | ICD-10-CM | POA: Diagnosis not present

## 2022-01-24 DIAGNOSIS — J929 Pleural plaque without asbestos: Secondary | ICD-10-CM | POA: Diagnosis not present

## 2022-01-24 DIAGNOSIS — I340 Nonrheumatic mitral (valve) insufficiency: Secondary | ICD-10-CM | POA: Diagnosis not present

## 2022-01-24 DIAGNOSIS — K5900 Constipation, unspecified: Secondary | ICD-10-CM | POA: Diagnosis not present

## 2022-01-24 DIAGNOSIS — J90 Pleural effusion, not elsewhere classified: Secondary | ICD-10-CM | POA: Diagnosis not present

## 2022-01-24 DIAGNOSIS — A419 Sepsis, unspecified organism: Principal | ICD-10-CM | POA: Diagnosis present

## 2022-01-24 DIAGNOSIS — R55 Syncope and collapse: Secondary | ICD-10-CM | POA: Diagnosis not present

## 2022-01-24 DIAGNOSIS — Z8616 Personal history of COVID-19: Secondary | ICD-10-CM | POA: Diagnosis not present

## 2022-01-24 DIAGNOSIS — I495 Sick sinus syndrome: Secondary | ICD-10-CM | POA: Diagnosis not present

## 2022-01-24 DIAGNOSIS — A0472 Enterocolitis due to Clostridium difficile, not specified as recurrent: Secondary | ICD-10-CM | POA: Diagnosis not present

## 2022-01-24 DIAGNOSIS — I081 Rheumatic disorders of both mitral and tricuspid valves: Secondary | ICD-10-CM | POA: Diagnosis not present

## 2022-01-24 DIAGNOSIS — M546 Pain in thoracic spine: Secondary | ICD-10-CM | POA: Diagnosis not present

## 2022-01-24 DIAGNOSIS — K219 Gastro-esophageal reflux disease without esophagitis: Secondary | ICD-10-CM | POA: Diagnosis not present

## 2022-01-24 DIAGNOSIS — Z7952 Long term (current) use of systemic steroids: Secondary | ICD-10-CM

## 2022-01-24 DIAGNOSIS — E1151 Type 2 diabetes mellitus with diabetic peripheral angiopathy without gangrene: Secondary | ICD-10-CM | POA: Diagnosis not present

## 2022-01-24 DIAGNOSIS — Z950 Presence of cardiac pacemaker: Secondary | ICD-10-CM

## 2022-01-24 DIAGNOSIS — D65 Disseminated intravascular coagulation [defibrination syndrome]: Secondary | ICD-10-CM | POA: Diagnosis present

## 2022-01-24 DIAGNOSIS — I4891 Unspecified atrial fibrillation: Secondary | ICD-10-CM | POA: Diagnosis not present

## 2022-01-24 DIAGNOSIS — Z888 Allergy status to other drugs, medicaments and biological substances status: Secondary | ICD-10-CM

## 2022-01-24 DIAGNOSIS — Z79899 Other long term (current) drug therapy: Secondary | ICD-10-CM

## 2022-01-24 DIAGNOSIS — E785 Hyperlipidemia, unspecified: Secondary | ICD-10-CM | POA: Diagnosis not present

## 2022-01-24 DIAGNOSIS — J9811 Atelectasis: Secondary | ICD-10-CM | POA: Diagnosis not present

## 2022-01-24 DIAGNOSIS — K5730 Diverticulosis of large intestine without perforation or abscess without bleeding: Secondary | ICD-10-CM | POA: Diagnosis not present

## 2022-01-24 DIAGNOSIS — N1832 Chronic kidney disease, stage 3b: Secondary | ICD-10-CM | POA: Diagnosis present

## 2022-01-24 DIAGNOSIS — Z885 Allergy status to narcotic agent status: Secondary | ICD-10-CM

## 2022-01-24 DIAGNOSIS — M810 Age-related osteoporosis without current pathological fracture: Secondary | ICD-10-CM | POA: Diagnosis present

## 2022-01-24 DIAGNOSIS — A4189 Other specified sepsis: Secondary | ICD-10-CM | POA: Diagnosis not present

## 2022-01-24 DIAGNOSIS — T827XXA Infection and inflammatory reaction due to other cardiac and vascular devices, implants and grafts, initial encounter: Secondary | ICD-10-CM | POA: Diagnosis present

## 2022-01-24 DIAGNOSIS — M069 Rheumatoid arthritis, unspecified: Secondary | ICD-10-CM | POA: Diagnosis not present

## 2022-01-24 DIAGNOSIS — M4646 Discitis, unspecified, lumbar region: Secondary | ICD-10-CM | POA: Diagnosis not present

## 2022-01-24 DIAGNOSIS — R0902 Hypoxemia: Secondary | ICD-10-CM | POA: Diagnosis not present

## 2022-01-24 DIAGNOSIS — I5189 Other ill-defined heart diseases: Secondary | ICD-10-CM | POA: Diagnosis not present

## 2022-01-24 DIAGNOSIS — Z8249 Family history of ischemic heart disease and other diseases of the circulatory system: Secondary | ICD-10-CM

## 2022-01-24 DIAGNOSIS — I083 Combined rheumatic disorders of mitral, aortic and tricuspid valves: Secondary | ICD-10-CM | POA: Diagnosis not present

## 2022-01-24 DIAGNOSIS — R1110 Vomiting, unspecified: Secondary | ICD-10-CM | POA: Diagnosis not present

## 2022-01-24 DIAGNOSIS — Z4682 Encounter for fitting and adjustment of non-vascular catheter: Secondary | ICD-10-CM | POA: Diagnosis not present

## 2022-01-24 DIAGNOSIS — R339 Retention of urine, unspecified: Secondary | ICD-10-CM | POA: Diagnosis present

## 2022-01-24 DIAGNOSIS — A0471 Enterocolitis due to Clostridium difficile, recurrent: Secondary | ICD-10-CM | POA: Diagnosis not present

## 2022-01-24 DIAGNOSIS — Z452 Encounter for adjustment and management of vascular access device: Secondary | ICD-10-CM | POA: Diagnosis not present

## 2022-01-24 DIAGNOSIS — I2510 Atherosclerotic heart disease of native coronary artery without angina pectoris: Secondary | ICD-10-CM | POA: Diagnosis not present

## 2022-01-24 DIAGNOSIS — M545 Low back pain, unspecified: Secondary | ICD-10-CM | POA: Diagnosis not present

## 2022-01-24 DIAGNOSIS — Y831 Surgical operation with implant of artificial internal device as the cause of abnormal reaction of the patient, or of later complication, without mention of misadventure at the time of the procedure: Secondary | ICD-10-CM | POA: Diagnosis present

## 2022-01-24 DIAGNOSIS — N189 Chronic kidney disease, unspecified: Secondary | ICD-10-CM | POA: Diagnosis not present

## 2022-01-24 DIAGNOSIS — E119 Type 2 diabetes mellitus without complications: Secondary | ICD-10-CM | POA: Diagnosis not present

## 2022-01-24 DIAGNOSIS — T827XXD Infection and inflammatory reaction due to other cardiac and vascular devices, implants and grafts, subsequent encounter: Secondary | ICD-10-CM | POA: Diagnosis not present

## 2022-01-24 DIAGNOSIS — T827XXS Infection and inflammatory reaction due to other cardiac and vascular devices, implants and grafts, sequela: Secondary | ICD-10-CM | POA: Diagnosis not present

## 2022-01-24 DIAGNOSIS — R079 Chest pain, unspecified: Secondary | ICD-10-CM | POA: Diagnosis not present

## 2022-01-24 DIAGNOSIS — I480 Paroxysmal atrial fibrillation: Secondary | ICD-10-CM | POA: Diagnosis present

## 2022-01-24 DIAGNOSIS — R112 Nausea with vomiting, unspecified: Secondary | ICD-10-CM | POA: Diagnosis not present

## 2022-01-24 DIAGNOSIS — B029 Zoster without complications: Secondary | ICD-10-CM | POA: Diagnosis not present

## 2022-01-24 DIAGNOSIS — D631 Anemia in chronic kidney disease: Secondary | ICD-10-CM | POA: Diagnosis not present

## 2022-01-24 DIAGNOSIS — Z8619 Personal history of other infectious and parasitic diseases: Secondary | ICD-10-CM | POA: Diagnosis not present

## 2022-01-24 DIAGNOSIS — E872 Acidosis, unspecified: Secondary | ICD-10-CM | POA: Diagnosis not present

## 2022-01-24 DIAGNOSIS — J811 Chronic pulmonary edema: Secondary | ICD-10-CM | POA: Diagnosis not present

## 2022-01-24 DIAGNOSIS — M549 Dorsalgia, unspecified: Secondary | ICD-10-CM | POA: Diagnosis not present

## 2022-01-24 DIAGNOSIS — Z833 Family history of diabetes mellitus: Secondary | ICD-10-CM

## 2022-01-24 DIAGNOSIS — R0689 Other abnormalities of breathing: Secondary | ICD-10-CM | POA: Diagnosis not present

## 2022-01-24 DIAGNOSIS — J9690 Respiratory failure, unspecified, unspecified whether with hypoxia or hypercapnia: Secondary | ICD-10-CM | POA: Diagnosis not present

## 2022-01-24 DIAGNOSIS — R54 Age-related physical debility: Secondary | ICD-10-CM | POA: Diagnosis present

## 2022-01-24 DIAGNOSIS — G8929 Other chronic pain: Secondary | ICD-10-CM | POA: Diagnosis present

## 2022-01-24 DIAGNOSIS — R5381 Other malaise: Secondary | ICD-10-CM | POA: Diagnosis not present

## 2022-01-24 DIAGNOSIS — Z882 Allergy status to sulfonamides status: Secondary | ICD-10-CM

## 2022-01-24 DIAGNOSIS — I517 Cardiomegaly: Secondary | ICD-10-CM | POA: Diagnosis not present

## 2022-01-24 DIAGNOSIS — R6521 Severe sepsis with septic shock: Secondary | ICD-10-CM | POA: Diagnosis not present

## 2022-01-24 DIAGNOSIS — R0602 Shortness of breath: Secondary | ICD-10-CM | POA: Diagnosis not present

## 2022-01-24 DIAGNOSIS — K5903 Drug induced constipation: Secondary | ICD-10-CM | POA: Diagnosis not present

## 2022-01-24 DIAGNOSIS — R0789 Other chest pain: Secondary | ICD-10-CM | POA: Diagnosis not present

## 2022-01-24 DIAGNOSIS — R6520 Severe sepsis without septic shock: Secondary | ICD-10-CM | POA: Diagnosis not present

## 2022-01-24 DIAGNOSIS — R062 Wheezing: Secondary | ICD-10-CM | POA: Diagnosis not present

## 2022-01-24 LAB — BASIC METABOLIC PANEL
Anion gap: 17 — ABNORMAL HIGH (ref 5–15)
BUN: 47 mg/dL — ABNORMAL HIGH (ref 8–23)
CO2: 21 mmol/L — ABNORMAL LOW (ref 22–32)
Calcium: 8.3 mg/dL — ABNORMAL LOW (ref 8.9–10.3)
Chloride: 104 mmol/L (ref 98–111)
Creatinine, Ser: 2.21 mg/dL — ABNORMAL HIGH (ref 0.44–1.00)
GFR, Estimated: 21 mL/min — ABNORMAL LOW (ref >60)
Glucose, Bld: 134 mg/dL — ABNORMAL HIGH (ref 70–99)
Potassium: 3.9 mmol/L (ref 3.5–5.1)
Sodium: 142 mmol/L (ref 135–145)

## 2022-01-24 LAB — URINALYSIS, ROUTINE W REFLEX MICROSCOPIC
Bilirubin Urine: NEGATIVE
Glucose, UA: NEGATIVE mg/dL
Hgb urine dipstick: NEGATIVE
Ketones, ur: NEGATIVE mg/dL
Nitrite: NEGATIVE
Protein, ur: 30 mg/dL — AB
Specific Gravity, Urine: 1.018 (ref 1.005–1.030)
pH: 5 (ref 5.0–8.0)

## 2022-01-24 LAB — GLUCOSE, CAPILLARY
Glucose-Capillary: 104 mg/dL — ABNORMAL HIGH (ref 70–99)
Glucose-Capillary: 127 mg/dL — ABNORMAL HIGH (ref 70–99)
Glucose-Capillary: 158 mg/dL — ABNORMAL HIGH (ref 70–99)

## 2022-01-24 LAB — CBC WITH DIFFERENTIAL/PLATELET
Abs Immature Granulocytes: 0.35 10*3/uL — ABNORMAL HIGH (ref 0.00–0.07)
Basophils Absolute: 0.1 10*3/uL (ref 0.0–0.1)
Basophils Relative: 1 %
Eosinophils Absolute: 0.1 10*3/uL (ref 0.0–0.5)
Eosinophils Relative: 1 %
HCT: 28.2 % — ABNORMAL LOW (ref 36.0–46.0)
Hemoglobin: 9.1 g/dL — ABNORMAL LOW (ref 12.0–15.0)
Immature Granulocytes: 2 %
Lymphocytes Relative: 24 %
Lymphs Abs: 4.4 10*3/uL — ABNORMAL HIGH (ref 0.7–4.0)
MCH: 30.6 pg (ref 26.0–34.0)
MCHC: 32.3 g/dL (ref 30.0–36.0)
MCV: 94.9 fL (ref 80.0–100.0)
Monocytes Absolute: 1.5 10*3/uL — ABNORMAL HIGH (ref 0.1–1.0)
Monocytes Relative: 8 %
Neutro Abs: 11.8 10*3/uL — ABNORMAL HIGH (ref 1.7–7.7)
Neutrophils Relative %: 64 %
Platelets: 150 10*3/uL (ref 150–400)
RBC: 2.97 MIL/uL — ABNORMAL LOW (ref 3.87–5.11)
RDW: 16.1 % — ABNORMAL HIGH (ref 11.5–15.5)
WBC: 18.2 10*3/uL — ABNORMAL HIGH (ref 4.0–10.5)
nRBC: 0 % (ref 0.0–0.2)

## 2022-01-24 LAB — I-STAT VENOUS BLOOD GAS, ED
Acid-base deficit: 3 mmol/L — ABNORMAL HIGH (ref 0.0–2.0)
Bicarbonate: 22.7 mmol/L (ref 20.0–28.0)
Calcium, Ion: 1.14 mmol/L — ABNORMAL LOW (ref 1.15–1.40)
HCT: 25 % — ABNORMAL LOW (ref 36.0–46.0)
Hemoglobin: 8.5 g/dL — ABNORMAL LOW (ref 12.0–15.0)
O2 Saturation: 73 %
Potassium: 3.8 mmol/L (ref 3.5–5.1)
Sodium: 141 mmol/L (ref 135–145)
TCO2: 24 mmol/L (ref 22–32)
pCO2, Ven: 43.3 mmHg — ABNORMAL LOW (ref 44–60)
pH, Ven: 7.328 (ref 7.25–7.43)
pO2, Ven: 41 mmHg (ref 32–45)

## 2022-01-24 LAB — BRAIN NATRIURETIC PEPTIDE: B Natriuretic Peptide: 1004.5 pg/mL — ABNORMAL HIGH (ref 0.0–100.0)

## 2022-01-24 LAB — TROPONIN I (HIGH SENSITIVITY): Troponin I (High Sensitivity): 42 ng/L — ABNORMAL HIGH (ref <18)

## 2022-01-24 LAB — LACTIC ACID, PLASMA
Lactic Acid, Venous: 2.8 mmol/L (ref 0.5–1.9)
Lactic Acid, Venous: 3.5 mmol/L (ref 0.5–1.9)

## 2022-01-24 LAB — HEMOGLOBIN A1C
Hgb A1c MFr Bld: 7.3 % — ABNORMAL HIGH (ref 4.8–5.6)
Mean Plasma Glucose: 162.81 mg/dL

## 2022-01-24 LAB — RESP PANEL BY RT-PCR (FLU A&B, COVID) ARPGX2
Influenza A by PCR: NEGATIVE
Influenza B by PCR: NEGATIVE
SARS Coronavirus 2 by RT PCR: NEGATIVE

## 2022-01-24 LAB — PROTIME-INR
INR: 1.2 (ref 0.8–1.2)
Prothrombin Time: 15.5 seconds — ABNORMAL HIGH (ref 11.4–15.2)

## 2022-01-24 LAB — POC OCCULT BLOOD, ED: Fecal Occult Bld: NEGATIVE

## 2022-01-24 LAB — PROCALCITONIN: Procalcitonin: 34.89 ng/mL

## 2022-01-24 MED ORDER — LACTATED RINGERS IV SOLN: INTRAVENOUS | Status: DC

## 2022-01-24 MED ORDER — LACTATED RINGERS IV BOLUS (SEPSIS)
250.0000 mL | Freq: Once | INTRAVENOUS | Status: AC
  Administered 2022-01-24: 250 mL via INTRAVENOUS

## 2022-01-24 MED ORDER — INSULIN ASPART 100 UNIT/ML IJ SOLN
0.0000 [IU] | INTRAMUSCULAR | Status: DC
  Administered 2022-01-24: 1 [IU] via SUBCUTANEOUS
  Administered 2022-01-24 – 2022-01-26 (×8): 2 [IU] via SUBCUTANEOUS
  Administered 2022-01-26 (×2): 1 [IU] via SUBCUTANEOUS
  Administered 2022-01-26 (×2): 2 [IU] via SUBCUTANEOUS
  Administered 2022-01-27: 1 [IU] via SUBCUTANEOUS
  Administered 2022-01-27 (×2): 2 [IU] via SUBCUTANEOUS
  Administered 2022-01-28 (×2): 5 [IU] via SUBCUTANEOUS
  Administered 2022-01-28: 3 [IU] via SUBCUTANEOUS
  Administered 2022-01-28: 1 [IU] via SUBCUTANEOUS
  Administered 2022-01-29 (×2): 3 [IU] via SUBCUTANEOUS
  Administered 2022-01-29: 2 [IU] via SUBCUTANEOUS
  Administered 2022-01-30: 3 [IU] via SUBCUTANEOUS
  Administered 2022-01-30: 5 [IU] via SUBCUTANEOUS
  Administered 2022-01-31 (×2): 2 [IU] via SUBCUTANEOUS
  Administered 2022-01-31: 3 [IU] via SUBCUTANEOUS
  Administered 2022-01-31 – 2022-02-01 (×3): 2 [IU] via SUBCUTANEOUS
  Administered 2022-02-01: 5 [IU] via SUBCUTANEOUS
  Administered 2022-02-01 (×2): 1 [IU] via SUBCUTANEOUS
  Administered 2022-02-02: 2 [IU] via SUBCUTANEOUS
  Administered 2022-02-02: 3 [IU] via SUBCUTANEOUS

## 2022-01-24 MED ORDER — ACETAMINOPHEN 325 MG PO TABS
650.0000 mg | ORAL_TABLET | ORAL | Status: DC | PRN
  Administered 2022-01-25 – 2022-01-27 (×6): 650 mg via ORAL
  Filled 2022-01-24 (×9): qty 2

## 2022-01-24 MED ORDER — VANCOMYCIN HCL 500 MG/100ML IV SOLN: 500.0000 mg | INTRAVENOUS | Status: DC

## 2022-01-24 MED ORDER — VANCOMYCIN HCL IN DEXTROSE 1-5 GM/200ML-% IV SOLN: 1000.0000 mg | Freq: Once | INTRAVENOUS | Status: DC

## 2022-01-24 MED ORDER — OXYCODONE HCL 5 MG PO TABS
5.0000 mg | ORAL_TABLET | Freq: Four times a day (QID) | ORAL | Status: AC | PRN
  Administered 2022-01-24 – 2022-01-25 (×2): 5 mg via ORAL
  Filled 2022-01-24 (×2): qty 1

## 2022-01-24 MED ORDER — TECHNETIUM TO 99M ALBUMIN AGGREGATED
4.2000 | Freq: Once | INTRAVENOUS | Status: AC | PRN
  Administered 2022-01-24: 4.2 via INTRAVENOUS

## 2022-01-24 MED ORDER — METRONIDAZOLE 500 MG/100ML IV SOLN
500.0000 mg | Freq: Once | INTRAVENOUS | Status: AC
  Administered 2022-01-24: 500 mg via INTRAVENOUS
  Filled 2022-01-24: qty 100

## 2022-01-24 MED ORDER — NOREPINEPHRINE 4 MG/250ML-% IV SOLN
2.0000 ug/min | INTRAVENOUS | Status: DC
  Administered 2022-01-24: 11 ug/min via INTRAVENOUS
  Administered 2022-01-24: 14 ug/min via INTRAVENOUS
  Filled 2022-01-24 (×2): qty 250

## 2022-01-24 MED ORDER — POLYETHYLENE GLYCOL 3350 17 G PO PACK
17.0000 g | PACK | Freq: Every day | ORAL | Status: DC | PRN
  Administered 2022-01-31: 17 g via ORAL
  Filled 2022-01-24: qty 1

## 2022-01-24 MED ORDER — SODIUM CHLORIDE 0.9 % IV SOLN
250.0000 mL | INTRAVENOUS | Status: DC
  Administered 2022-01-25 – 2022-01-27 (×2): 250 mL via INTRAVENOUS

## 2022-01-24 MED ORDER — ORAL CARE MOUTH RINSE: 15.0000 mL | OROMUCOSAL | Status: DC | PRN

## 2022-01-24 MED ORDER — NOREPINEPHRINE 4 MG/250ML-% IV SOLN
0.0000 ug/min | INTRAVENOUS | Status: DC
  Administered 2022-01-24: 2 ug/min via INTRAVENOUS
  Filled 2022-01-24: qty 250

## 2022-01-24 MED ORDER — ACETAMINOPHEN 650 MG RE SUPP
650.0000 mg | Freq: Once | RECTAL | Status: AC
  Administered 2022-01-24: 650 mg via RECTAL
  Filled 2022-01-24: qty 1

## 2022-01-24 MED ORDER — PANTOPRAZOLE SODIUM 40 MG PO TBEC
40.0000 mg | DELAYED_RELEASE_TABLET | Freq: Every day | ORAL | Status: DC
  Administered 2022-01-24 – 2022-02-02 (×9): 40 mg via ORAL
  Filled 2022-01-24 (×10): qty 1

## 2022-01-24 MED ORDER — LACTATED RINGERS IV BOLUS (SEPSIS)
500.0000 mL | Freq: Once | INTRAVENOUS | Status: AC
  Administered 2022-01-24: 500 mL via INTRAVENOUS

## 2022-01-24 MED ORDER — HYDROCORTISONE SOD SUC (PF) 100 MG IJ SOLR
100.0000 mg | Freq: Three times a day (TID) | INTRAMUSCULAR | Status: DC
  Administered 2022-01-24 – 2022-01-26 (×6): 100 mg via INTRAVENOUS
  Filled 2022-01-24 (×7): qty 2

## 2022-01-24 MED ORDER — DOCUSATE SODIUM 100 MG PO CAPS: 100.0000 mg | ORAL_CAPSULE | Freq: Two times a day (BID) | ORAL | Status: DC | PRN

## 2022-01-24 MED ORDER — CHLORHEXIDINE GLUCONATE CLOTH 2 % EX PADS
6.0000 | MEDICATED_PAD | Freq: Every day | CUTANEOUS | Status: DC
  Administered 2022-01-25 – 2022-02-02 (×10): 6 via TOPICAL

## 2022-01-24 MED ORDER — ALBUMIN HUMAN 25 % IV SOLN
25.0000 g | Freq: Once | INTRAVENOUS | Status: AC
  Administered 2022-01-24: 12.5 g via INTRAVENOUS
  Filled 2022-01-24: qty 100

## 2022-01-24 MED ORDER — SODIUM CHLORIDE 0.9 % IV SOLN
2.0000 g | INTRAVENOUS | Status: DC
  Administered 2022-01-24: 2 g via INTRAVENOUS
  Filled 2022-01-24: qty 20

## 2022-01-24 MED ORDER — LACTATED RINGERS IV BOLUS (SEPSIS)
1000.0000 mL | Freq: Once | INTRAVENOUS | Status: AC
  Administered 2022-01-24: 1000 mL via INTRAVENOUS

## 2022-01-24 MED ORDER — ORAL CARE MOUTH RINSE
15.0000 mL | OROMUCOSAL | Status: DC
  Administered 2022-01-24 – 2022-02-02 (×25): 15 mL via OROMUCOSAL

## 2022-01-24 MED ORDER — LACTATED RINGERS IV BOLUS
500.0000 mL | Freq: Once | INTRAVENOUS | Status: AC
  Administered 2022-01-24: 500 mL via INTRAVENOUS

## 2022-01-24 MED ORDER — ONDANSETRON HCL 4 MG/2ML IJ SOLN
4.0000 mg | Freq: Four times a day (QID) | INTRAMUSCULAR | Status: DC | PRN
  Administered 2022-01-24 – 2022-01-29 (×8): 4 mg via INTRAVENOUS
  Filled 2022-01-24 (×7): qty 2

## 2022-01-24 NOTE — ED Notes (Addendum)
Patient placed back on nonrebreather at 15L due to patient going down to 64% on 6L. Provider made aware

## 2022-01-24 NOTE — Procedures (Signed)
Central Venous Catheter Insertion Procedure Note  Shelby Walker  [id number]  March 05, 1934  Date:01/24/22  Time:6:31 PM   Provider Performing:Corbitt Cloke Chauncey Cruel [name]   Procedure: Insertion of Non-tunneled Central Venous [id number]) with US guidance (12244)   Indication(s) Medication administration  Consent Risks of the procedure as well as the alternatives and risks of each were explained to the patient and/or caregiver.  Consent for the procedure was obtained and is signed in the bedside chart  Anesthesia Topical only with 1% lidocaine   Timeout Verified patient identification, verified procedure, site/side was marked, verified correct patient position, special equipment/implants available, medications/allergies/relevant history reviewed, required imaging and test results available.  Sterile Technique Maximal sterile technique including full sterile barrier drape, hand hygiene, sterile gown, sterile gloves, mask, hair covering, sterile ultrasound probe cover (if used).  Procedure Description Area of catheter insertion was cleaned with chlorhexidine and draped in sterile fashion.  With real-time ultrasound guidance a central venous catheter was placed into the left internal jugular vein. Nonpulsatile blood flow and easy flushing noted in all ports.  The catheter was sutured in place and sterile dressing applied.    Complications/Tolerance None; patient tolerated the procedure well. Chest X-ray is ordered to verify placement for internal jugular or subclavian cannulation.   Chest x-ray is not ordered for femoral cannulation.  EBL Minimal  Specimen(s) None   Redmond [employer]., MSN, APRN, AGACNP-BC [facility]  01/24/2022 , 6:31 PM  Please see [email or web address] for pager details  If no response, please call [phone] After hours, please call Elink at [phone]

## 2022-01-24 NOTE — Progress Notes (Signed)
[facility] Progress Note Patient Name: Shelby Walker DOB: 09/13/1933 MRN: [id number]   Date of Service  01/24/2022  HPI/Events of Note  Patient reports neck and back pain. Pain not improved with ordered Tylenol. Nurse requesting home Tylenol #3. However, Tylenol #3 contraindicated d/t renal function.  eICU Interventions  Plan: Oxycodone IR 5 mg PO Q 6 hours PRN severe pain X 2 doses.      Intervention Category Major Interventions: Other:  Lysle Dingwall 01/24/2022, 7:43 PM

## 2022-01-24 NOTE — ED Triage Notes (Signed)
Patient coming from home. Patient had a complaint of CP yesterday. Took a nitro and then went to bed. Patient was found unresponsive this am by family. Patient was initially 70% RA. Patient BP was 60sys on EMS arrival. IV established and 500 fluids and 1 neb given Patient had covid 4 weeks ago and patient also had a pacemaker placed 2 weeks ago.

## 2022-01-24 NOTE — H&P (Signed)
NAME:  Shelby Walker, MRN:  [id number], DOB:  1933/03/18, LOS: 0 ADMISSION DATE:  01/24/2022, CONSULTATION DATE: 01/24/2022 REFERRING MD: Emergency Department physician, CHIEF COMPLAINT: Hypoxia hypertension  History of Present Illness:  88 year old female with extensive past medical history well-documented below who presents with being seen last night complaining of chest pain taken nitroglycerin going to sleep.  This morning she is extremely difficult to arouse and she is brought to the emergency room found to have a fever of 103 and is felt to be septic.  She required fluid resuscitation initiation of antimicrobial therapy.  She has a history of severe C. difficile colitis therefore C. difficile was ordered prophylactically.  Pulmonary critical care called to bedside for hypoxia and hypotension.  On examination her sats were 100% her blood pressure was 127/86 and she was lucid and following commands.  Physical exam was unremarkable.  But due to her frail nature and complexity of her care she will be admitted to intensive care unit for further evaluation and treatment.  Pertinent  Medical History   Past Medical History:  Diagnosis Date   Bruit    Carotid Doppler showed no significant abnormality     9per patient)   C. difficile colitis 07/2018   with severe sepsis   Chest pain, unspecified    Nuclear, May, 2008, no scar or ischemia   Diabetes mellitus    Diverticulosis    Dyslipidemia    Ejection fraction    EF 55-60%, echo, February, 2011 // Echocardiogram 8/21: EF 60-65, no RWMA, Gr 1 DD, GLS -14%, normal RVSF, mild LAE, trivial MR, mild MS (mean 4 mmHg), RVSP 23.4    GERD (gastroesophageal reflux disease)    Mitral regurgitation April 21, 2009   mild,  echo, February, 2011   Osteoporosis    Palpitations    possible very brief atrial fibrillation on monitor and possible reentrant tachycardia   Psoriasis    Rheumatoid arthritis ([place])      Significant Hospital Events: Including  procedures, antibiotic start and stop dates in addition to other pertinent events     Interim History / Subjective:  Admitted with chest pain hypoxia and hypotension from presumed sepsis  Objective   Blood pressure (!) 125/109, pulse 96, temperature (!) 103 F (39.4 C), temperature source Rectal, resp. rate (!) 29, height '5\' 1"'$  (1.549 m), weight 51 kg, SpO2 98 %.        Intake/Output Summary (Last 24 hours) at 01/24/2022 1143 Last data filed at 01/24/2022 3570 Gross per 24 hour  Intake 750 ml  Output --  Net 750 ml   Filed Weights   01/24/22 0921  Weight: 51 kg    Examination: General: Elderly female responsive to voice HENT: No JVD is appreciated Lungs: Decreased breath sounds bilaterally Cardiovascular: Heart sounds are irregular with no murmur Abdomen: Tender positive bowel sounds Extremities: Right foot with old metatarsal surgical intervention Neuro: Follows commands GU: Voids  Resolved Hospital Problem list     Assessment & Plan:  Shock most likely multifactorial with presumed sepsis of unknown source possibly urine possibly abdominal in the setting of coronary artery disease and advanced age. Admit to the intensive care unit Empirical antimicrobial therapy Check C. difficile for completeness she has a history of C. Difficile Abdominal CT Stress steroids Peripheral vasopressors Cardiac monitoring  Reports of chest pain prior to going to sleep requiring a nitroglycerin, note she has a permanent pacemaker followed by Dr. Crissie Sickles of cardiology Paroxysmal atrial fibrillation Troponins  have been ordered Twelve-lead EKG reviewed  Rheumatoid arthritis steroid-dependent Stress dose steroids  Chronic kidney disease Lab Results  Component Value Date   CREATININE 2.21 (H) 01/24/2022   CREATININE 1.22 (H) 01/06/2022   CREATININE 1.11 (H) 01/05/2022   CREATININE 1.26 (H) 10/06/2020   No elevation in creatinine IV fluids  Diabetes mellitus with  complications CBG (last 3)  No results for input(s): "GLUCAP" in the last 72 hours. Sliding scale insulin protocol   Best Practice (right click and "Reselect all SmartList Selections" daily)   Diet/type: NPO DVT prophylaxis: not indicated GI prophylaxis:  Lines: PPIN/A Foley:  N/A Code Status:  full code Last date of multidisciplinary goals of care discussion [TBD]  Labs   CBC: Recent Labs  Lab 01/24/22 0935 01/24/22 0936  WBC 18.2*  --   NEUTROABS 11.8*  --   HGB 9.1* 8.5*  HCT 28.2* 25.0*  MCV 94.9  --   PLT 150  --     Basic Metabolic Panel: Recent Labs  Lab 01/24/22 0935 01/24/22 0936  NA 142 141  K 3.9 3.8  CL 104  --   CO2 21*  --   GLUCOSE 134*  --   BUN 47*  --   CREATININE 2.21*  --   CALCIUM 8.3*  --    GFR: Estimated Creatinine Clearance: 13.8 mL/min (A) (by C-G formula based on SCr of 2.21 mg/dL (H)). Recent Labs  Lab 01/24/22 0935  WBC 18.2*  LATICACIDVEN 2.8*    Liver Function Tests: No results for input(s): "AST", "ALT", "ALKPHOS", "BILITOT", "PROT", "ALBUMIN" in the last 168 hours. No results for input(s): "LIPASE", "AMYLASE" in the last 168 hours. No results for input(s): "AMMONIA" in the last 168 hours.  ABG    Component Value Date/Time   HCO3 22.7 01/24/2022 0936   TCO2 24 01/24/2022 0936   ACIDBASEDEF 3.0 (H) 01/24/2022 0936   O2SAT 73 01/24/2022 0936     Coagulation Profile: Recent Labs  Lab 01/24/22 0935  INR 1.2    Cardiac Enzymes: No results for input(s): "CKTOTAL", "CKMB", "CKMBINDEX", "TROPONINI" in the last 168 hours.  HbA1C: Hgb A1c MFr Bld  Date/Time Value Ref Range Status  01/05/2022 04:37 AM 7.7 (H) 4.8 - 5.6 % Final    Comment:    (NOTE) Pre diabetes:          5.7%-6.4%  Diabetes:              >6.4%  Glycemic control for   <7.0% adults with diabetes   01/18/2021 01:32 AM 7.9 (H) 4.8 - 5.6 % Final    Comment:    (NOTE) Pre diabetes:          5.7%-6.4%  Diabetes:              >6.4%  Glycemic  control for   <7.0% adults with diabetes     CBG: No results for input(s): "GLUCAP" in the last 168 hours.  Review of Systems:   10 point review of system taken, please see HPI for positives and negatives.   Past Medical History:  She,  has a past medical history of Bruit, C. difficile colitis (07/2018), Chest pain, unspecified, Diabetes mellitus, Diverticulosis, Dyslipidemia, Ejection fraction, GERD (gastroesophageal reflux disease), Mitral regurgitation (April 21, 2009), Osteoporosis, Palpitations, Psoriasis, and Rheumatoid arthritis ([place]).   Surgical History:   Past Surgical History:  Procedure Laterality Date   ABDOMINAL AORTOGRAM W/LOWER EXTREMITY Right 10/19/2020   Procedure: ABDOMINAL AORTOGRAM W/LOWER EXTREMITY;  Surgeon: Fletcher Anon,  Mertie Clause, MD;  Location: [facility];  Service: Cardiovascular;  Laterality: Right;   FRACTURE SURGERY     LOOP RECORDER REMOVAL N/A 01/05/2022   Procedure: LOOP RECORDER REMOVAL;  Surgeon: Evans Lance, MD;  Location: [facility];  Service: Cardiovascular;  Laterality: N/A;   PACEMAKER IMPLANT N/A 01/05/2022   Procedure: PACEMAKER IMPLANT;  Surgeon: Evans Lance, MD;  Location: [facility];  Service: Cardiovascular;  Laterality: N/A;   TRANSMETATARSAL AMPUTATION Right 01/18/2021   Procedure: TRANSMETATARSAL AMPUTATION;  Surgeon: Wylene Simmer, MD;  Location: [facility];  Service: Orthopedics;  Laterality: Right;     Social History:   reports that she has never smoked. She has never used smokeless tobacco. She reports that she does not drink alcohol and does not use drugs.   Family History:  Her family history includes Diabetes in her father and mother; Heart attack in her father and mother; Heart failure in her father and mother; Stroke in her sister.   Allergies Allergies  Allergen Reactions   Cholestyramine     Possible- Makes throat burn. Patient said she can't take it    Compazine [Prochlorperazine Edisylate]  Swelling    REACTION: " tongue swell and unable to swallow"   Sulfonamide Derivatives Other (See Comments)    REACTION: " broke out with fine itching bumps"   Hydrocodone Other (See Comments)   Infliximab Other (See Comments)    Other reaction(s): rash   Leflunomide     Other reaction(s): diarrhea   Prochlorperazine Other (See Comments)    Other reaction(s): Unknown   Rosuvastatin     Other reaction(s): muscle aches   Sulfa Antibiotics Other (See Comments)    Other reaction(s): tongue swelling     Home Medications  Prior to Admission medications   Medication Sig Start Date End Date Taking? Authorizing Provider  acetaminophen (TYLENOL) 650 MG CR tablet Take 1,300 mg by mouth every 8 (eight) hours as needed for pain.    [provider]  acetaminophen-codeine (TYLENOL #3) 300-30 MG tablet Take 1 tablet by mouth in the morning, at noon, and at bedtime. Patient taking differently: Take 1 tablet by mouth as needed for moderate pain. 02/06/21   Love, Ivan Anchors, PA-C  atorvastatin (LIPITOR) 20 MG tablet Take 1 tablet (20 mg total) by mouth daily. 08/25/21   Nahser, Wonda Cheng, MD  Calcium Carbonate-Vitamin D 600-400 MG-UNIT tablet Take 1 tablet by mouth daily at 12 noon.     [provider]  carvedilol (COREG) 6.25 MG tablet TAKE 1 TABLET BY MOUTH TWICE DAILY WITH A MEAL 12/01/21   Nahser, Wonda Cheng, MD  diclofenac Sodium (VOLTAREN) 1 % GEL Apply 2 g topically 4 (four) times daily. 02/06/21   Love, Ivan Anchors, PA-C  esomeprazole (NEXIUM) 20 MG capsule Take 20 mg by mouth in the morning.    [provider]  gabapentin (NEURONTIN) 100 MG capsule Take 1 capsule (100 mg total) by mouth 3 (three) times daily. 02/06/21   Love, Ivan Anchors, PA-C  insulin aspart (NOVOLOG) 100 UNIT/ML FlexPen Inject 3 Units into the skin 3 (three) times daily with meals. Sliding scale Patient taking differently: Inject 6-8 Units into the skin 3 (three) times daily with meals. Sliding scale 02/06/21   Love,  Ivan Anchors, PA-C  Insulin Degludec (TRESIBA [place]) Inject 6-8 Units into the skin at bedtime.    [provider]  lip balm (CARMEX) ointment Apply topically as needed for lip care. 02/01/21  Love, Ivan Anchors, PA-C  liver oil-zinc oxide (DESITIN) 40 % ointment Apply 1 application topically 3 (three) times daily. To raw areas in between/on buttocks 02/01/21   Love, Ivan Anchors, PA-C  Multiple Vitamins-Minerals (CENTRUM SILVER 50+WOMEN PO) Take 1 tablet by mouth daily.    [provider]  nitroGLYCERIN (NITROSTAT) 0.4 MG SL tablet Dissolve 1 tablet under the tongue every 5 minutes as needed for chest pain. Max of 3 doses, then 911. 12/20/21   Nahser, Wonda Cheng, MD  Nutritional Supplements (ENSURE ACTIVE) LIQD Take 237 mLs by mouth in the morning. Prefers Chocolate    [provider]  predniSONE (DELTASONE) 1 MG tablet Take 4 mg by mouth in the morning. 01/24/19   [provider]  pyrithione zinc (SELSUN BLUE DRY SCALP) 1 % shampoo Apply 1 application topically once a week. 04/15/15   [provider]  RESTASIS 0.05 % ophthalmic emulsion Place 1 drop into both eyes 2 (two) times daily. 02/13/19   [provider]  saccharomyces boulardii (FLORASTOR) 250 MG capsule Take 250 mg by mouth 2 (two) times daily.    [provider]  traMADol (ULTRAM) 50 MG tablet Take 50 mg by mouth 2 (two) times daily as needed. 07/18/21   [provider]     Critical care time: 45 min     Richardson Landry Mcclain Shall ACNP Acute Care Nurse Practitioner [place] Please consult Amion 01/24/2022, 11:43 AM

## 2022-01-24 NOTE — Progress Notes (Signed)
Pharmacy Antibiotic Note  Shelby Walker is a 88 y.o. female admitted on 01/24/2022 presenting with CP and respiratory distress, concern for sepsis.  Pharmacy has been consulted for vancomycin dosing.  Plan: Vancomycin 1g IV x 1, then 500 mg IV q 48h (eAUC 429) Monitor renal function, Cx and clinical progression to narrow Vancomycin levels as needed  Height: '5\' 1"'$  (154.9 cm) Weight: 51 kg (112 lb 7 oz) IBW/kg (Calculated) : 47.8  Temp (24hrs), Avg:103 F (39.4 C), Min:103 F (39.4 C), Max:103 F (39.4 C)  Recent Labs  Lab 01/24/22 0935  WBC 18.2*  CREATININE 2.21*  LATICACIDVEN 2.8*    Estimated Creatinine Clearance: 13.8 mL/min (A) (by C-G formula based on SCr of 2.21 mg/dL (H)).    Allergies  Allergen Reactions   Cholestyramine     Possible- Makes throat burn. Patient said she can't take it    Compazine [Prochlorperazine Edisylate] Swelling    REACTION: " tongue swell and unable to swallow"   Sulfonamide Derivatives Other (See Comments)    REACTION: " broke out with fine itching bumps"   Hydrocodone Other (See Comments)   Infliximab Other (See Comments)    Other reaction(s): rash   Leflunomide     Other reaction(s): diarrhea   Prochlorperazine Other (See Comments)    Other reaction(s): Unknown   Rosuvastatin     Other reaction(s): muscle aches   Sulfa Antibiotics Other (See Comments)    Other reaction(s): tongue swelling    Bertis Ruddy, PharmD Clinical Pharmacist ED Pharmacist Phone # [phone] 01/24/2022 10:56 AM

## 2022-01-24 NOTE — ED Provider Notes (Signed)
[facility] EMERGENCY DEPARTMENT Provider Note   CSN: [id number] Arrival date & time: 01/24/22  8527     History  Chief Complaint  Patient presents with   Shortness of Breath    Shelby Walker is a 88 y.o. female.  Pt is a 88 yo female with a pmhx significant for mitral regurg, DM, GERD, HLD, RA, paroxysmal afib (not on thinners), CKD, and HTN.  Pt presents from home with AMS.  Pt did complain of cp last night and took a nitro and went to bed.  This am, she was found unresponsive in her bed by family.  She was incontinent of stool and urine.  She has also vomited.  Pt's initial BP was 60 palp by EMS.  O2 sat was 70% on RA.  Pt will awaken and follow simple commands, but she does not contribute to history.        Home Medications Prior to Admission medications   Medication Sig Start Date End Date Taking? Authorizing Provider  acetaminophen (TYLENOL) 650 MG CR tablet Take 1,300 mg by mouth every 8 (eight) hours as needed for pain.    [provider]  acetaminophen-codeine (TYLENOL #3) 300-30 MG tablet Take 1 tablet by mouth in the morning, at noon, and at bedtime. Patient taking differently: Take 1 tablet by mouth as needed for moderate pain. 02/06/21   Love, Ivan Anchors, PA-C  atorvastatin (LIPITOR) 20 MG tablet Take 1 tablet (20 mg total) by mouth daily. 08/25/21   Nahser, Wonda Cheng, MD  Calcium Carbonate-Vitamin D 600-400 MG-UNIT tablet Take 1 tablet by mouth daily at 12 noon.     [provider]  carvedilol (COREG) 6.25 MG tablet TAKE 1 TABLET BY MOUTH TWICE DAILY WITH A MEAL 12/01/21   Nahser, Wonda Cheng, MD  diclofenac Sodium (VOLTAREN) 1 % GEL Apply 2 g topically 4 (four) times daily. 02/06/21   Love, Ivan Anchors, PA-C  esomeprazole (NEXIUM) 20 MG capsule Take 20 mg by mouth in the morning.    [provider]  gabapentin (NEURONTIN) 100 MG capsule Take 1 capsule (100 mg total) by mouth 3 (three) times daily. 02/06/21   Love, Ivan Anchors, PA-C   insulin aspart (NOVOLOG) 100 UNIT/ML FlexPen Inject 3 Units into the skin 3 (three) times daily with meals. Sliding scale Patient taking differently: Inject 6-8 Units into the skin 3 (three) times daily with meals. Sliding scale 02/06/21   Love, Ivan Anchors, PA-C  Insulin Degludec (TRESIBA [place]) Inject 6-8 Units into the skin at bedtime.    [provider]  lip balm (CARMEX) ointment Apply topically as needed for lip care. 02/01/21   Love, Ivan Anchors, PA-C  liver oil-zinc oxide (DESITIN) 40 % ointment Apply 1 application topically 3 (three) times daily. To raw areas in between/on buttocks 02/01/21   Love, Ivan Anchors, PA-C  Multiple Vitamins-Minerals (CENTRUM SILVER 50+WOMEN PO) Take 1 tablet by mouth daily.    [provider]  nitroGLYCERIN (NITROSTAT) 0.4 MG SL tablet Dissolve 1 tablet under the tongue every 5 minutes as needed for chest pain. Max of 3 doses, then 911. 12/20/21   Nahser, Wonda Cheng, MD  Nutritional Supplements (ENSURE ACTIVE) LIQD Take 237 mLs by mouth in the morning. Prefers Chocolate    [provider]  predniSONE (DELTASONE) 1 MG tablet Take 4 mg by mouth in the morning. 01/24/19   [provider]  pyrithione zinc (SELSUN BLUE DRY SCALP) 1 % shampoo Apply 1 application topically once  a week. 04/15/15   [provider]  RESTASIS 0.05 % ophthalmic emulsion Place 1 drop into both eyes 2 (two) times daily. 02/13/19   [provider]  saccharomyces boulardii (FLORASTOR) 250 MG capsule Take 250 mg by mouth 2 (two) times daily.    [provider]  traMADol (ULTRAM) 50 MG tablet Take 50 mg by mouth 2 (two) times daily as needed. 07/18/21   [provider]      Allergies    Cholestyramine, Compazine [prochlorperazine edisylate], Sulfonamide derivatives, Hydrocodone, Infliximab, Leflunomide, Prochlorperazine, Rosuvastatin, and Sulfa antibiotics    Review of Systems   Review of Systems  Unable to perform ROS: Mental status  change  All other systems reviewed and are negative.   Physical Exam Updated Vital Signs BP (!) 101/42   Pulse 96   Temp (!) 103 F (39.4 C) (Rectal)   Resp (!) 23   Ht '5\' 1"'$  (1.549 m)   Wt 51 kg   SpO2 100%   BMI 21.24 kg/m  Physical Exam Vitals and nursing note reviewed.  Constitutional:      General: She is in acute distress.     Appearance: She is ill-appearing.  HENT:     Head: Normocephalic and atraumatic.     Mouth/Throat:     Mouth: Mucous membranes are dry.  Eyes:     Extraocular Movements: Extraocular movements intact.     Pupils: Pupils are equal, round, and reactive to light.  Cardiovascular:     Rate and Rhythm: Regular rhythm. Tachycardia present.  Pulmonary:     Effort: Tachypnea and respiratory distress present.     Breath sounds: Rhonchi present.  Abdominal:     General: Bowel sounds are normal.     Palpations: Abdomen is soft.  Genitourinary:    Rectum: Guaiac result negative.  Musculoskeletal:        General: Normal range of motion.     Cervical back: Normal range of motion and neck supple.     Comments: Transmetatarsal amp right foot  Skin:    General: Skin is warm.     Capillary Refill: Capillary refill takes 2 to 3 seconds.  Neurological:     Mental Status: She is alert. She is disoriented.  Psychiatric:     Comments: Unable to assess     ED Results / Procedures / Treatments   Labs (all labs ordered are listed, but only abnormal results are displayed) Labs Reviewed  BASIC METABOLIC PANEL - Abnormal; Notable for the following components:      Result Value   CO2 21 (*)    Glucose, Bld 134 (*)    BUN 47 (*)    Creatinine, Ser 2.21 (*)    Calcium 8.3 (*)    GFR, Estimated 21 (*)    Anion gap 17 (*)    All other components within normal limits  CBC WITH DIFFERENTIAL/PLATELET - Abnormal; Notable for the following components:   WBC 18.2 (*)    RBC 2.97 (*)    Hemoglobin 9.1 (*)    HCT 28.2 (*)    RDW 16.1 (*)    Neutro Abs 11.8  (*)    Lymphs Abs 4.4 (*)    Monocytes Absolute 1.5 (*)    Abs Immature Granulocytes 0.35 (*)    All other components within normal limits  BRAIN NATRIURETIC PEPTIDE - Abnormal; Notable for the following components:   B Natriuretic Peptide 1,004.5 (*)    All other components within normal limits  URINALYSIS, ROUTINE W REFLEX MICROSCOPIC - Abnormal; Notable for the following components:   APPearance HAZY (*)    Protein, ur 30 (*)    Leukocytes,Ua MODERATE (*)    Bacteria, UA RARE (*)    All other components within normal limits  LACTIC ACID, PLASMA - Abnormal; Notable for the following components:   Lactic Acid, Venous 2.8 (*)    All other components within normal limits  PROTIME-INR - Abnormal; Notable for the following components:   Prothrombin Time 15.5 (*)    All other components within normal limits  I-STAT VENOUS BLOOD GAS, ED - Abnormal; Notable for the following components:   pCO2, Ven 43.3 (*)    Acid-base deficit 3.0 (*)    Calcium, Ion 1.14 (*)    HCT 25.0 (*)    Hemoglobin 8.5 (*)    All other components within normal limits  TROPONIN I (HIGH SENSITIVITY) - Abnormal; Notable for the following components:   Troponin I (High Sensitivity) 42 (*)    All other components within normal limits  RESP PANEL BY RT-PCR (FLU A&B, COVID) ARPGX2  CULTURE, BLOOD (ROUTINE X 2)  CULTURE, BLOOD (ROUTINE X 2)  URINE CULTURE  GASTROINTESTINAL PANEL BY PCR, STOOL (REPLACES STOOL CULTURE)  PROCALCITONIN  LACTIC ACID, PLASMA  HEMOGLOBIN A1C  POC OCCULT BLOOD, ED    EKG EKG Interpretation  Date/Time:  Wednesday January 24 2022 09:27:50 EST Ventricular Rate:  105 PR Interval:  143 QRS Duration: 84 QT Interval:  374 QTC Calculation: 495 R Axis:   29 Text Interpretation: Sinus tachycardia Left ventricular hypertrophy Borderline prolonged QT interval Since last tracing rate faster Confirmed by Isla Pence [phone]) on 01/24/2022 10:11:21 AM  Radiology DG Chest Port 1  View  Result Date: 01/24/2022 CLINICAL DATA:  Chest pain yesterday. Found unresponsive this morning. Now short of breath. EXAM: PORTABLE CHEST 1 VIEW COMPARISON:  Radiographs 01/06/2022 and 01/04/2022.  CT 12/13/2021. FINDINGS: 0932 hours. Left subclavian pacemaker leads appear unchanged, overlapping the right atrium and the right ventricle. The heart size and mediastinal contours are stable with aortic atherosclerosis. Unchanged mild left basilar atelectasis. No edema, confluent airspace opacity, pleural effusion or pneumothorax. An old right-sided rib fracture is noted. No acute osseous findings are evident. Telemetry leads overlie the chest. IMPRESSION: No evidence of acute cardiopulmonary process. Stable mild left basilar atelectasis. Electronically Signed   By: Richardean Sale M.D.   On: 01/24/2022 09:49    Procedures Procedures    Medications Ordered in ED Medications  lactated ringers infusion ( Intravenous New Bag/Given 01/24/22 0956)  cefTRIAXone (ROCEPHIN) 2 g in sodium chloride 0.9 % 100 mL IVPB (0 g Intravenous Stopped 01/24/22 0959)  norepinephrine (LEVOPHED) '4mg'$  in 247m (0.016 mg/mL) premix infusion (6 mcg/min Intravenous Rate/Dose Change 01/24/22 1238)  vancomycin (VANCOCIN) IVPB 1000 mg/200 mL premix (has no administration in time range)  vancomycin (VANCOREADY) IVPB 500 mg/100 mL (has no administration in time range)  docusate sodium (COLACE) capsule 100 mg (has no administration in time range)  polyethylene glycol (MIRALAX / GLYCOLAX) packet 17 g (has no administration in time range)  acetaminophen (TYLENOL) tablet 650 mg (has no administration in time range)  ondansetron (ZOFRAN) injection 4 mg (has no administration in time range)  pantoprazole (PROTONIX) EC tablet 40 mg (has no administration in time range)  hydrocortisone sodium succinate (SOLU-CORTEF) 100 MG injection 100 mg (has no administration in time range)  insulin aspart (novoLOG) injection 0-9 Units (has no  administration in time range)  acetaminophen (TYLENOL)  suppository 650 mg (650 mg Rectal Given 01/24/22 0945)  lactated ringers bolus 1,000 mL (0 mLs Intravenous Stopped 01/24/22 1039)    And  lactated ringers bolus 500 mL (0 mLs Intravenous Stopped 01/24/22 0959)    And  lactated ringers bolus 250 mL (0 mLs Intravenous Stopped 01/24/22 0959)  metroNIDAZOLE (FLAGYL) IVPB 500 mg (0 mg Intravenous Stopped 01/24/22 1202)    ED Course/ Medical Decision Making/ A&P                           Medical Decision Making Amount and/or Complexity of Data Reviewed Labs: ordered. Radiology: ordered.  Risk OTC drugs. Prescription drug management. Decision regarding hospitalization.   This patient presents to the ED for concern of AMS, this involves an extensive number of treatment options, and is a complaint that carries with it a high risk of complications and morbidity.  The differential diagnosis includes sepsis, electrolyte abn, infection   Co morbidities that complicate the patient evaluation  mitral regurg, DM, GERD, HLD, RA, paroxysmal afib (not on thinners), CKD, and HTN   Additional history obtained:  Additional history obtained from epic chart review External records from outside source obtained and reviewed including EMS report   Lab Tests:  I Ordered, and personally interpreted labs.  The pertinent results include:  cbc with wbc elevated at 18.2, hgb low at 9.1(chronic), inr 1.5, vbg with ph 7.328; bmp with bun elevated at 47 and cr elevated at 2.21 (cr 1.22 on 11/4); lactic elevated at 2.8; ua neg for infection   Imaging Studies ordered:  I ordered imaging studies including cxr and ct chest/abd/pelvis I independently visualized and interpreted imaging which showed  No evidence of acute cardiopulmonary process. Stable mild left  basilar atelectasis.  CT chest/abd/pelvis pending on admission I agree with the radiologist interpretation   Cardiac Monitoring:  The  patient was maintained on a cardiac monitor.  I personally viewed and interpreted the cardiac monitored which showed an underlying rhythm of: sinus tachy   Medicines ordered and prescription drug management:  I ordered medication including ivfs/abx  for sepsis  Reevaluation of the patient after these medicines showed that the patient improved I have reviewed the patients home medicines and have made adjustments as needed   Test Considered:  ct   Critical Interventions:  Ivfs/abx/bp meds   Consultations Obtained:  I requested consultation with the ccm,  and discussed lab and imaging findings as well as pertinent plan - they will admit   Problem List / ED Course:  Sepsis:  code sepsis called.  Pt is requiring 15 L NRB to keep sats in the 90s.  We took her down, but O2 dropped back into the 60s.  She vomited, so is not a good candidate for bipap.  Pt given rocephin, vanc, and flagly and sepsis fluids.  BP worsening, so she is started on levophed.  No obvious source of infection, so a ct chest/abd/pelvis was ordered.  This is pending upon admission.  Pt d/w CCM who will admit. AKI:  likely due to sepsis picture.     Reevaluation:  After the interventions noted above, I reevaluated the patient and found that they have :worsened   Social Determinants of Health:  Lives at home   Dispostion:  After consideration of the diagnostic results and the patients response to treatment, I feel that the patent would benefit from admission.    CRITICAL CARE Performed by: Isla Pence  Total critical care time: 60 minutes  Critical care time was exclusive of separately billable procedures and treating other patients.  Critical care was necessary to treat or prevent imminent or life-threatening deterioration.  Critical care was time spent personally by me on the following activities: development of treatment plan with patient and/or surrogate as well as nursing, discussions with  consultants, evaluation of patient's response to treatment, examination of patient, obtaining history from patient or surrogate, ordering and performing treatments and interventions, ordering and review of laboratory studies, ordering and review of radiographic studies, pulse oximetry and re-evaluation of patient's condition.         Final Clinical Impression(s) / ED Diagnoses Final diagnoses:  Sepsis, due to unspecified organism, unspecified whether acute organ dysfunction present ([place])  AKI (acute kidney injury) [facility])    Rx / [place] Orders ED Discharge Orders     None         Isla Pence, MD 01/24/22 1250

## 2022-01-24 NOTE — Progress Notes (Signed)
Spoke with Seth Bake, RN regarding patient need for USGPIV for vasopressors.  Requested to hold off on this as patient may need a CVL soon.  Seth Bake, RN will re enter USGPIV order if this changes later and needs to have the IV started.

## 2022-01-24 NOTE — ED Notes (Signed)
Per critical care, they want to take patient off off nonrebreather and placed on 6L [place]

## 2022-01-25 ENCOUNTER — Inpatient Hospital Stay (HOSPITAL_COMMUNITY): Payer: Medicare Other

## 2022-01-25 ENCOUNTER — Other Ambulatory Visit (HOSPITAL_COMMUNITY): Payer: Medicare Other

## 2022-01-25 DIAGNOSIS — R7881 Bacteremia: Secondary | ICD-10-CM

## 2022-01-25 DIAGNOSIS — Z950 Presence of cardiac pacemaker: Secondary | ICD-10-CM | POA: Diagnosis not present

## 2022-01-25 DIAGNOSIS — A419 Sepsis, unspecified organism: Secondary | ICD-10-CM | POA: Diagnosis not present

## 2022-01-25 DIAGNOSIS — J9601 Acute respiratory failure with hypoxia: Secondary | ICD-10-CM | POA: Diagnosis not present

## 2022-01-25 DIAGNOSIS — B9561 Methicillin susceptible Staphylococcus aureus infection as the cause of diseases classified elsewhere: Secondary | ICD-10-CM | POA: Diagnosis not present

## 2022-01-25 DIAGNOSIS — R6521 Severe sepsis with septic shock: Secondary | ICD-10-CM | POA: Diagnosis not present

## 2022-01-25 LAB — GASTROINTESTINAL PANEL BY PCR, STOOL (REPLACES STOOL CULTURE)

## 2022-01-25 LAB — ECHOCARDIOGRAM COMPLETE
AR max vel: 1.61 cm2
AV Area VTI: 1.47 cm2
AV Area mean vel: 1.55 cm2
AV Mean grad: 3 mmHg
AV Peak grad: 6 mmHg
Ao pk vel: 1.22 m/s
Area-P 1/2: 2.81 cm2
Calc EF: 44.8 %
Height: 61 in
MV VTI: 1.08 cm2
S' Lateral: 3.9 cm
Single Plane A2C EF: 47.2 %
Single Plane A4C EF: 43.9 %
Weight: 1890.66 oz

## 2022-01-25 LAB — COMPREHENSIVE METABOLIC PANEL
ALT: 155 U/L — ABNORMAL HIGH (ref 0–44)
AST: 250 U/L — ABNORMAL HIGH (ref 15–41)
Albumin: 2.4 g/dL — ABNORMAL LOW (ref 3.5–5.0)
Alkaline Phosphatase: 61 U/L (ref 38–126)
Anion gap: 14 (ref 5–15)
BUN: 50 mg/dL — ABNORMAL HIGH (ref 8–23)
CO2: 18 mmol/L — ABNORMAL LOW (ref 22–32)
Calcium: 7.2 mg/dL — ABNORMAL LOW (ref 8.9–10.3)
Chloride: 103 mmol/L (ref 98–111)
Creatinine, Ser: 2.53 mg/dL — ABNORMAL HIGH (ref 0.44–1.00)
GFR, Estimated: 18 mL/min — ABNORMAL LOW (ref >60)
Glucose, Bld: 183 mg/dL — ABNORMAL HIGH (ref 70–99)
Potassium: 4.8 mmol/L (ref 3.5–5.1)
Sodium: 135 mmol/L (ref 135–145)
Total Bilirubin: 0.5 mg/dL (ref 0.3–1.2)
Total Protein: 4.6 g/dL — ABNORMAL LOW (ref 6.5–8.1)

## 2022-01-25 LAB — GLUCOSE, CAPILLARY
Glucose-Capillary: 160 mg/dL — ABNORMAL HIGH (ref 70–99)
Glucose-Capillary: 161 mg/dL — ABNORMAL HIGH (ref 70–99)
Glucose-Capillary: 166 mg/dL — ABNORMAL HIGH (ref 70–99)
Glucose-Capillary: 167 mg/dL — ABNORMAL HIGH (ref 70–99)
Glucose-Capillary: 169 mg/dL — ABNORMAL HIGH (ref 70–99)
Glucose-Capillary: 172 mg/dL — ABNORMAL HIGH (ref 70–99)
Glucose-Capillary: 176 mg/dL — ABNORMAL HIGH (ref 70–99)

## 2022-01-25 LAB — BASIC METABOLIC PANEL
Anion gap: 12 (ref 5–15)
Anion gap: 9 (ref 5–15)
BUN: 51 mg/dL — ABNORMAL HIGH (ref 8–23)
BUN: 52 mg/dL — ABNORMAL HIGH (ref 8–23)
CO2: 20 mmol/L — ABNORMAL LOW (ref 22–32)
CO2: 20 mmol/L — ABNORMAL LOW (ref 22–32)
Calcium: 7.3 mg/dL — ABNORMAL LOW (ref 8.9–10.3)
Calcium: 7.2 mg/dL — ABNORMAL LOW (ref 8.9–10.3)
Chloride: 105 mmol/L (ref 98–111)
Chloride: 105 mmol/L (ref 98–111)
Creatinine, Ser: 2.67 mg/dL — ABNORMAL HIGH (ref 0.44–1.00)
Creatinine, Ser: 2.49 mg/dL — ABNORMAL HIGH (ref 0.44–1.00)
GFR, Estimated: 17 mL/min — ABNORMAL LOW (ref >60)
GFR, Estimated: 18 mL/min — ABNORMAL LOW (ref >60)
Glucose, Bld: 188 mg/dL — ABNORMAL HIGH (ref 70–99)
Glucose, Bld: 178 mg/dL — ABNORMAL HIGH (ref 70–99)
Potassium: 4.8 mmol/L (ref 3.5–5.1)
Potassium: 4.5 mmol/L (ref 3.5–5.1)
Sodium: 137 mmol/L (ref 135–145)
Sodium: 134 mmol/L — ABNORMAL LOW (ref 135–145)

## 2022-01-25 LAB — BLOOD CULTURE ID PANEL (REFLEXED) - BCID2

## 2022-01-25 LAB — TROPONIN I (HIGH SENSITIVITY)
Troponin I (High Sensitivity): 1059 ng/L (ref <18)
Troponin I (High Sensitivity): 1789 ng/L (ref <18)
Troponin I (High Sensitivity): 1991 ng/L (ref <18)

## 2022-01-25 LAB — CBC
HCT: 27.9 % — ABNORMAL LOW (ref 36.0–46.0)
HCT: 27.1 % — ABNORMAL LOW (ref 36.0–46.0)
Hemoglobin: 8.4 g/dL — ABNORMAL LOW (ref 12.0–15.0)
Hemoglobin: 8.6 g/dL — ABNORMAL LOW (ref 12.0–15.0)
MCH: 29.2 pg (ref 26.0–34.0)
MCH: 30.3 pg (ref 26.0–34.0)
MCHC: 30.1 g/dL (ref 30.0–36.0)
MCHC: 31.7 g/dL (ref 30.0–36.0)
MCV: 96.9 fL (ref 80.0–100.0)
MCV: 95.4 fL (ref 80.0–100.0)
Platelets: 131 10*3/uL — ABNORMAL LOW (ref 150–400)
Platelets: 123 10*3/uL — ABNORMAL LOW (ref 150–400)
RBC: 2.88 MIL/uL — ABNORMAL LOW (ref 3.87–5.11)
RBC: 2.84 MIL/uL — ABNORMAL LOW (ref 3.87–5.11)
RDW: 16.5 % — ABNORMAL HIGH (ref 11.5–15.5)
RDW: 16.5 % — ABNORMAL HIGH (ref 11.5–15.5)
WBC: 21.3 10*3/uL — ABNORMAL HIGH (ref 4.0–10.5)
WBC: 20.8 10*3/uL — ABNORMAL HIGH (ref 4.0–10.5)
nRBC: 0 % (ref 0.0–0.2)
nRBC: 0 % (ref 0.0–0.2)

## 2022-01-25 LAB — MRSA NEXT GEN BY PCR, NASAL: MRSA by PCR Next Gen: NOT DETECTED

## 2022-01-25 LAB — MAGNESIUM
Magnesium: 1.7 mg/dL (ref 1.7–2.4)
Magnesium: 1.6 mg/dL — ABNORMAL LOW (ref 1.7–2.4)

## 2022-01-25 LAB — PHOSPHORUS: Phosphorus: 5.7 mg/dL — ABNORMAL HIGH (ref 2.5–4.6)

## 2022-01-25 LAB — DIC (DISSEMINATED INTRAVASCULAR COAGULATION)PANEL
D-Dimer, Quant: 13.22 ug/mL-FEU — ABNORMAL HIGH (ref 0.00–0.50)
Fibrinogen: 533 mg/dL — ABNORMAL HIGH (ref 210–475)
INR: 2 — ABNORMAL HIGH (ref 0.8–1.2)
Platelets: 125 10*3/uL — ABNORMAL LOW (ref 150–400)
Prothrombin Time: 22.5 seconds — ABNORMAL HIGH (ref 11.4–15.2)
Smear Review: NONE SEEN
aPTT: 45 seconds — ABNORMAL HIGH (ref 24–36)

## 2022-01-25 LAB — LACTIC ACID, PLASMA: Lactic Acid, Venous: 2.3 mmol/L (ref 0.5–1.9)

## 2022-01-25 LAB — C DIFFICILE QUICK SCREEN W PCR REFLEX
C Diff antigen: NEGATIVE
C Diff interpretation: NOT DETECTED
C Diff toxin: NEGATIVE

## 2022-01-25 LAB — COOXEMETRY PANEL
Carboxyhemoglobin: 2 % — ABNORMAL HIGH (ref 0.5–1.5)
Methemoglobin: <0.7 % (ref 0.0–1.5)
O2 Saturation: 62.2 %
Total hemoglobin: 8.3 g/dL — ABNORMAL LOW (ref 12.0–16.0)

## 2022-01-25 LAB — PROCALCITONIN: Procalcitonin: 43.15 ng/mL

## 2022-01-25 LAB — BRAIN NATRIURETIC PEPTIDE: B Natriuretic Peptide: 2919.6 pg/mL — ABNORMAL HIGH (ref 0.0–100.0)

## 2022-01-25 LAB — LIPASE, BLOOD: Lipase: 23 U/L (ref 11–51)

## 2022-01-25 MED ORDER — LACTATED RINGERS IV SOLN: INTRAVENOUS | Status: DC

## 2022-01-25 MED ORDER — LIDOCAINE 5 % EX PTCH
1.0000 | MEDICATED_PATCH | CUTANEOUS | Status: DC
  Administered 2022-01-25 – 2022-02-02 (×9): 1 via TRANSDERMAL
  Filled 2022-01-25 (×7): qty 1

## 2022-01-25 MED ORDER — NOREPINEPHRINE 4 MG/250ML-% IV SOLN
0.0000 ug/min | INTRAVENOUS | Status: DC
  Administered 2022-01-25: 5 ug/min via INTRAVENOUS
  Filled 2022-01-25: qty 250

## 2022-01-25 MED ORDER — VANCOMYCIN HCL 125 MG PO CAPS
125.0000 mg | ORAL_CAPSULE | Freq: Two times a day (BID) | ORAL | Status: DC
  Filled 2022-01-25 (×2): qty 1

## 2022-01-25 MED ORDER — DEXTROSE IN LACTATED RINGERS 5 % IV SOLN: INTRAVENOUS | Status: DC

## 2022-01-25 MED ORDER — FENTANYL CITRATE PF 50 MCG/ML IJ SOSY
12.5000 ug | PREFILLED_SYRINGE | INTRAMUSCULAR | Status: DC | PRN
  Administered 2022-01-25 – 2022-01-27 (×5): 12.5 ug via INTRAVENOUS
  Filled 2022-01-25 (×5): qty 1

## 2022-01-25 MED ORDER — LACTATED RINGERS IV BOLUS
1000.0000 mL | Freq: Once | INTRAVENOUS | Status: AC
  Administered 2022-01-25: 1000 mL via INTRAVENOUS

## 2022-01-25 MED ORDER — MAGNESIUM SULFATE 2 GM/50ML IV SOLN
2.0000 g | Freq: Once | INTRAVENOUS | Status: AC
  Administered 2022-01-25: 2 g via INTRAVENOUS
  Filled 2022-01-25: qty 50

## 2022-01-25 MED ORDER — HEPARIN SODIUM (PORCINE) 5000 UNIT/ML IJ SOLN
5000.0000 [IU] | Freq: Three times a day (TID) | INTRAMUSCULAR | Status: DC
  Administered 2022-01-25 (×2): 5000 [IU] via SUBCUTANEOUS
  Filled 2022-01-25 (×2): qty 1

## 2022-01-25 MED ORDER — METOCLOPRAMIDE HCL 5 MG/ML IJ SOLN
5.0000 mg | Freq: Once | INTRAMUSCULAR | Status: AC
  Administered 2022-01-25: 5 mg via INTRAVENOUS
  Filled 2022-01-25: qty 2

## 2022-01-25 MED ORDER — LACTATED RINGERS IV BOLUS: 1000.0000 mL | Freq: Once | INTRAVENOUS | Status: DC

## 2022-01-25 MED ORDER — FUROSEMIDE 10 MG/ML IJ SOLN
20.0000 mg | Freq: Once | INTRAMUSCULAR | Status: AC
  Administered 2022-01-25: 20 mg via INTRAVENOUS
  Filled 2022-01-25: qty 2

## 2022-01-25 MED ORDER — CEFAZOLIN SODIUM-DEXTROSE 1-4 GM/50ML-% IV SOLN
1.0000 g | Freq: Two times a day (BID) | INTRAVENOUS | Status: DC
  Administered 2022-01-25 (×3): 1 g via INTRAVENOUS
  Filled 2022-01-25 (×5): qty 50

## 2022-01-25 NOTE — Progress Notes (Signed)
PHARMACY - PHYSICIAN COMMUNICATION CRITICAL VALUE ALERT - BLOOD CULTURE IDENTIFICATION (BCID)  Shelby Walker is an 88 y.o. female who presented to [facility] on 01/24/2022 with a chief complaint of AMS and hypoxemia found to be in septic shock due to pyelonephritis  Assessment:  MSSA  Name of physician (or Provider) Contacted: S. Somers  Current antibiotics: ceftriaxone, changing to Ancef  Changes to prescribed antibiotics recommended:  Recommendations accepted by provider  Results for orders placed or performed during the hospital encounter of 01/24/22  Blood Culture ID Panel (Reflexed) (Collected: 01/24/2022  9:23 AM)  Result Value Ref Range   Enterococcus faecalis NOT DETECTED NOT DETECTED   Enterococcus Faecium NOT DETECTED NOT DETECTED   Listeria monocytogenes NOT DETECTED NOT DETECTED   Staphylococcus species DETECTED (A) NOT DETECTED   Staphylococcus aureus (BCID) DETECTED (A) NOT DETECTED   Staphylococcus epidermidis NOT DETECTED NOT DETECTED   Staphylococcus lugdunensis NOT DETECTED NOT DETECTED   Streptococcus species NOT DETECTED NOT DETECTED   Streptococcus agalactiae NOT DETECTED NOT DETECTED   Streptococcus pneumoniae NOT DETECTED NOT DETECTED   Streptococcus pyogenes NOT DETECTED NOT DETECTED   A.calcoaceticus-baumannii NOT DETECTED NOT DETECTED   Bacteroides fragilis NOT DETECTED NOT DETECTED   Enterobacterales NOT DETECTED NOT DETECTED   Enterobacter cloacae complex NOT DETECTED NOT DETECTED   Escherichia coli NOT DETECTED NOT DETECTED   Klebsiella aerogenes NOT DETECTED NOT DETECTED   Klebsiella oxytoca NOT DETECTED NOT DETECTED   Klebsiella pneumoniae NOT DETECTED NOT DETECTED   Proteus species NOT DETECTED NOT DETECTED   Salmonella species NOT DETECTED NOT DETECTED   Serratia marcescens NOT DETECTED NOT DETECTED   Haemophilus influenzae NOT DETECTED NOT DETECTED   Neisseria meningitidis NOT DETECTED NOT DETECTED   Pseudomonas aeruginosa NOT DETECTED NOT  DETECTED   Stenotrophomonas maltophilia NOT DETECTED NOT DETECTED   Candida albicans NOT DETECTED NOT DETECTED   Candida auris NOT DETECTED NOT DETECTED   Candida glabrata NOT DETECTED NOT DETECTED   Candida krusei NOT DETECTED NOT DETECTED   Candida parapsilosis NOT DETECTED NOT DETECTED   Candida tropicalis NOT DETECTED NOT DETECTED   Cryptococcus neoformans/gattii NOT DETECTED NOT DETECTED   Meth resistant mecA/C and MREJ NOT DETECTED NOT DETECTED    Nicole Kindred L Bryceson Grape 01/25/2022  12:22 AM

## 2022-01-25 NOTE — Progress Notes (Signed)
  Echocardiogram 2D Echocardiogram has been performed.  Shelby Walker 01/25/2022, 1:37 PM

## 2022-01-25 NOTE — Progress Notes (Signed)
[facility] Progress Note Patient Name: STANISHA LORENZ DOB: 1933/04/20 MRN: [id number]   Date of Service  01/25/2022  HPI/Events of Note  RN reports pt voided 100 ml, but has post void residual 500 ml.  Has been receiving lasix challenge per RN.  Asking for order for in/out cath  Also asking for order for a.m. labs  eICU Interventions  In and out cath ordered Reviewed previous labs and abnormalities noted Reasonable to repeat AM CBC and electrolytes     Intervention Category Intermediate Interventions: Other:  Judd Lien 01/25/2022, 11:17 PM

## 2022-01-25 NOTE — Progress Notes (Signed)
[facility] Physician-Brief Progress Note Patient Name: Shelby Walker DOB: 1933/04/22 MRN: [id number]   Date of Service  01/25/2022  HPI/Events of Note  Nausea - Not improved by Zofran. Unfortunately, the patient has documented allergies to Compazine and Phenergan. Will try Reglan.   eICU Interventions  Plan: Reglan 5 mg IV X 1.      Intervention Category Major Interventions: Other:  Lysle Dingwall 01/25/2022, 4:32 AM

## 2022-01-25 NOTE — H&P (Signed)
NAME:  Shelby Walker, MRN:  [id number], DOB:  [birth year], LOS: 1 ADMISSION DATE:  01/24/2022, CONSULTATION DATE: 01/24/2022 REFERRING MD: Emergency Department physician, CHIEF COMPLAINT: Hypoxia hypertension  HiBRIEF  88 year old female with extensive past medical history well-documented below who presents with being seen last night complaining of chest pain taken nitroglycerin going to sleep.  This morning she is extremely difficult to arouse and she is brought to the emergency room found to have a fever of 103 and is felt to be septic.  She required fluid resuscitation initiation of antimicrobial therapy.  She has a history of severe C. difficile colitis therefore C. difficile was ordered prophylactically.  Pulmonary critical care called to bedside for hypoxia and hypotension.  On examination her sats were 100% her blood pressure was 127/86 and she was lucid and following commands.  Physical exam was unremarkable.  But due to her frail nature and complexity of her care she will be admitted to intensive care unit for further evaluation and treatment.  Pertinent  Medical History    has a past medical history of Bruit, C. difficile colitis (07/2018), Chest pain, unspecified, Diabetes mellitus, Diverticulosis, Dyslipidemia, Ejection fraction, GERD (gastroesophageal reflux disease), Mitral regurgitation (April 21, 2009), Osteoporosis, Palpitations, Psoriasis, and Rheumatoid arthritis ([place]).   has a past surgical history that includes Fracture surgery; ABDOMINAL AORTOGRAM W/LOWER EXTREMITY (Right, 10/19/2020); Transmetatarsal amputation (Right, 01/18/2021); PACEMAKER IMPLANT (N/A, 01/05/2022); and LOOP RECORDER REMOVAL (N/A, 01/05/2022).   Significant Hospital Events: Including procedures, antibiotic start and stop dates in addition to other pertinent events   01/05/22 - New pacemaker and loop recorder 01/05/22 - Dr Crissie Sickles Ef 65% and Gr1 DDx 01/24/2022 - Admitted with chest pain hypoxia and  hypotension from presumed sepsis Stool GI Panel neg MRSA pcr  - neg CVD PCR - neg Flu PCR - neg BCID - STAPH POSITIVe Blood culture - GPC Left IJ  CATH   Interim History / Subjective:   11/23 - high fever now improved. On abx.  Growing staph in blood. On 4L [place] - cxr wet v mild ALI on 4L [place] down from 6L -> pulse ox 99%.  On levophed 50mg - MAP 84. Creat up 2.6 from 2.1. Not much ruine. EKG  yesteday - no acute changed . QTc fine. Did have vomitting over night. Also per dtr - pain nos overngiht  Objective   Blood pressure (!) 98/52, pulse 79, temperature 98.9 F (37.2 C), temperature source Axillary, resp. rate 13, height '5\' 1"'$  (1.549 m), weight 53.6 kg, SpO2 93 %. CVP:  [1 mmHg-79 mmHg] 70 mmHg      Intake/Output Summary (Last 24 hours) at 01/25/2022 0758 Last data filed at 01/25/2022 0600 Gross per 24 hour  Intake 2525.64 ml  Output --  Net 2525.64 ml   Filed Weights   01/24/22 0921 01/25/22 0500  Weight: 51 kg 53.6 kg     General: No distress. Frail. No distress. O2 on Neuro: Alert and Oriented x 3. GCS 15. Speech normal Psych: Pleasant Resp:  Barrel Chest - no.  Wheeze - no, Crackles - no, No overt respiratory distress CVS: Normal heart sounds. Murmurs - no CHEST - fresh scar of leff pacemaker in infra clav area. NO REDNESS. NO Tenderness Ext: Stigmata of Connective Tissue Disease - RA SEVERE DEFORMITES + SKIN - scatterd brusing esp chest HEENT: Normal upper airway. PEERL +. No post nasal drip   Resolved Hospital Problem list     Assessment & Plan:  SEPTIC SHOCK due  to staph bacteremia - Present on Admit (fever, high wbc, immune suppressed, Staph in BCID, new pacer 01/05/22, GI panel neg)  11/23 - peripnehric stranding likely red herring. Pacemaker scar - looks healthy  Plan  - cefazolin  - lD should auto consult - get echo - baed on ID wil likely need cards consult -awai tculures  Lactic Acidosis  Plan  - monitor  HL  - on statin at ahome CP  (hx cad  and carotid dz)+ - Present on Admit and on med mgmt. No hx of catdiac cath Paroxysmal atrial fibrillation - s/p pacemaker an dloop recorder 01/05/22  11./23 - trop flast  Plan - repeat Troponins have been ordered -   Rheumatoid arthritis steroid-dependent - Dr Amil Amen  Plan Stress dose steroids  Chronic kidney disease - baseline creat 1.'3mg'$ % AKI - Present on Admit due to sepsis  11/23 - worse wth drop in Ur OO  Plan  - fluid bolus  - check coox   Chronic anemia - hgb 8-9gm%  11/23 - at baseline  Plan  - PRBC for hgb </= 6.9gm%    - exceptions are   -  if ACS susepcted/confirmed then transfuse for hgb </= 8.0gm%,  or    -  active bleeding with hemodynamic instability, then transfuse regardless of hemoglobin value   At at all times try to transfuse 1 unit prbc as possible with exception of active hemorrhage   Thrombocytopenia - new mild 01/25/22  Plan  - check DIC panel   Chronic pain from RA- on gabapentin and tranadol at home with T#3 opiodi an diclofenac gel  Plan  - hold  home pain meds in setting of AKI  Vomitting - 01/24/22   01/25/22 - none this morning. Abd soft  Plan  - NPO  - PPI - check lipase  Diabetes mellitus with complications CBG (last 3)   plan Sliding scale insulin protocol   Best Practice (right click and "Reselect all SmartList Selections" daily)   Diet/type: NPO fo rnow +./- clear DVT prophylaxis: start sq heparin but watch palatents (not on ac at home) GI prophylaxis: home ppi to cntinue Lines: PPIN/A Foley:  N/A Code Status:  full code Last date of multidisciplinary goals of care discussion [TBD] - aptiehnt at bedside. Then called daughter 01/25/22 [name] updated      [place]   The patient Shelby Walker is critically ill with multiple organ systems failure and requires high complexity decision making for assessment and support, frequent evaluation and titration of therapies, application of advanced  monitoring technologies and extensive interpretation of multiple databases.   Critical Care Time devoted to patient care services described in this note is  40  Minutes. This time reflects time of care of this signee Dr Brand Males. This critical care time does not reflect procedure time, or teaching time or supervisory time of PA/NP/Med student/Med Resident etc but could involve care discussion time     Dr. Brand Males, M.D., [facility].C.P Pulmonary and Critical Care Medicine Medical Director - [facility] ICU Staff Physician, [place] [facility] Pager: [phone], If no answer or between  15:00h - 7:00h: call [phone]  01/25/2022 7:58 AM    LABS    PULMONARY Recent Labs  Lab 01/24/22 0936  HCO3 22.7  TCO2 24  O2SAT 73    CBC Recent Labs  Lab 01/24/22 0935 01/24/22 0936 01/25/22 0514  HGB 9.1*  8.5* 8.4*  HCT 28.2* 25.0* 27.9*  WBC 18.2*  --  21.3*  PLT 150  --  131*    COAGULATION Recent Labs  Lab 01/24/22 0935  INR 1.2    CARDIAC  No results for input(s): "TROPONINI" in the last 168 hours. No results for input(s): "PROBNP" in the last 168 hours.   CHEMISTRY Recent Labs  Lab 01/24/22 0935 01/24/22 0936 01/25/22 0514  NA 142 141 137  K 3.9 3.8 4.8  CL 104  --  105  CO2 21*  --  20*  GLUCOSE 134*  --  188*  BUN 47*  --  51*  CREATININE 2.21*  --  2.67*  CALCIUM 8.3*  --  7.3*  MG  --   --  1.7   Estimated Creatinine Clearance: 11.4 mL/min (A) (by C-G formula based on SCr of 2.67 mg/dL (H)).   LIVER Recent Labs  Lab 01/24/22 0935  INR 1.2     INFECTIOUS Recent Labs  Lab 01/24/22 0935 01/24/22 1503  LATICACIDVEN 2.8* 3.5*  PROCALCITON 34.89  --      ENDOCRINE CBG (last 3)  Recent Labs    01/24/22 2201 01/25/22 0050 01/25/22 0503  GLUCAP 158* 169* 161*         IMAGING x48h  - image(s) personally visualized  -   highlighted in bold DG CHEST PORT 1  VIEW  Result Date: 01/24/2022 CLINICAL DATA:  Central line placement EXAM: PORTABLE CHEST 1 VIEW COMPARISON:  01/24/2022, 9:32 a.m. FINDINGS: Cardiomegaly. Left chest multi lead pacer. Interval placement of left neck vascular catheter, tip over the superior vena cava. Both lungs are clear. The visualized skeletal structures are unremarkable. IMPRESSION: 1. Interval placement of left neck vascular catheter, tip over the superior vena cava. No pneumothorax. 2. Cardiomegaly. No acute abnormality of the lungs. Electronically Signed   By: Delanna Ahmadi M.D.   On: 01/24/2022 18:55   NM Pulmonary Perfusion  Result Date: 01/24/2022 CLINICAL DATA:  Renal failure, diabetes mellitus, paroxysmal atrial fibrillation, hypertension, chest pain, question pulmonary embolism EXAM: NUCLEAR MEDICINE PERFUSION LUNG SCAN TECHNIQUE: Perfusion images were obtained in multiple projections after intravenous injection of radiopharmaceutical. Ventilation scans intentionally deferred if perfusion scan and chest x-ray adequate for interpretation during COVID 19 epidemic. RADIOPHARMACEUTICALS:  4.2 mCi Tc-14mMAA IV COMPARISON:  01/24/2022. FINDINGS: Normal perfusion lung scan. No perfusion defects. IMPRESSION: Normal perfusion lung scan. Electronically Signed   By: MLavonia DanaM.D.   On: 01/24/2022 14:49   CT CHEST ABDOMEN PELVIS WO CONTRAST  Result Date: 01/24/2022 CLINICAL DATA:  Sepsis, shortness of breath EXAM: CT CHEST, ABDOMEN AND PELVIS WITHOUT CONTRAST TECHNIQUE: Multidetector CT imaging of the chest, abdomen and pelvis was performed following the standard protocol without IV contrast. RADIATION DOSE REDUCTION: This exam was performed according to the departmental dose-optimization program which includes automated exposure control, adjustment of the mA and/or kV according to patient size and/or use of iterative reconstruction technique. COMPARISON:  CT abdomen pelvis, 08/27/2018 FINDINGS: CT CHEST FINDINGS Cardiovascular:  Aortic atherosclerosis. Left chest multi lead pacer. Cardiomegaly. Three-vessel coronary artery calcifications. No pericardial effusion. Mediastinum/Nodes: No enlarged mediastinal, hilar, or axillary lymph nodes. Thyroid gland, trachea, and esophagus demonstrate no significant findings. Lungs/Pleura: Small bilateral pleural effusions and associated atelectasis or consolidation. Diffuse bilateral bronchial wall thickening and interlobular septal thickening. Musculoskeletal: No chest wall abnormality. No acute osseous findings. CT ABDOMEN PELVIS FINDINGS Hepatobiliary: No solid liver abnormality is seen. Tiny gallstones or calcified sludge in the gallbladder fundus (series  3, image 66). No gallbladder wall thickening, or biliary dilatation. Pancreas: Unremarkable. No pancreatic ductal dilatation or surrounding inflammatory changes. Spleen: Normal in size without significant abnormality. Adrenals/Urinary Tract: Adrenal glands are unremarkable. Multiple simple, benign bilateral renal cortical cysts, including a hemorrhagic or proteinaceous cyst of the superior pole of the right kidney. Bilateral perinephric fat stranding. Urinary tract calculi or hydronephrosis. Bladder is unremarkable. Stomach/Bowel: Stomach is within normal limits. Appendix appears normal. No evidence of bowel wall thickening, distention, or inflammatory changes. Sigmoid diverticulosis. Vascular/Lymphatic: Aortic atherosclerosis. No enlarged abdominal or pelvic lymph nodes. Reproductive: No mass or other abnormality. Other: No abdominal wall hernia or abnormality. No ascites. Musculoskeletal: No acute osseous findings. IMPRESSION: 1. Bilateral perinephric fat stranding, suggesting pyelonephritis. No urinary tract calculi or hydronephrosis. Correlate with urinalysis. 2. Small bilateral pleural effusions, diffuse bilateral bronchial wall thickening, and interlobular septal thickening, consistent with pulmonary edema. 3. Cardiomegaly and coronary artery  disease. 4. Tiny gallstones or calcified sludge in the gallbladder fundus. No evidence of acute cholecystitis. 5. Sigmoid diverticulosis without evidence of acute diverticulitis. Aortic Atherosclerosis (ICD10-I70.0). Electronically Signed   By: Delanna Ahmadi M.D.   On: 01/24/2022 12:53   DG Chest Port 1 View  Result Date: 01/24/2022 CLINICAL DATA:  Chest pain yesterday. Found unresponsive this morning. Now short of breath. EXAM: PORTABLE CHEST 1 VIEW COMPARISON:  Radiographs 01/06/2022 and 01/04/2022.  CT 12/13/2021. FINDINGS: 0932 hours. Left subclavian pacemaker leads appear unchanged, overlapping the right atrium and the right ventricle. The heart size and mediastinal contours are stable with aortic atherosclerosis. Unchanged mild left basilar atelectasis. No edema, confluent airspace opacity, pleural effusion or pneumothorax. An old right-sided rib fracture is noted. No acute osseous findings are evident. Telemetry leads overlie the chest. IMPRESSION: No evidence of acute cardiopulmonary process. Stable mild left basilar atelectasis. Electronically Signed   By: Richardean Sale M.D.   On: 01/24/2022 09:49

## 2022-01-25 NOTE — Progress Notes (Signed)
eLink Physician-Brief Progress Note Patient Name: Shelby Walker DOB: 25-Mar-1933 MRN: [id number]   Date of Service  01/25/2022  HPI/Events of Note  Multiple issues: 1. Rounding physicians concerned about C. Difficile, but never ordered test, 2. Nursing concerned that the patient is dry. CVP = 15. 3. Patient still c/o back/neck pain. Nursing request for Lidocaine patch. 1. Patient requests ice chips and sips of liquids. Nursing states that patient has passed bedside swallow study.   eICU Interventions  Plan: C. Difficile PCR testing and Enteric isolation. LR IV infusion at 100 mL/hour. Lidocaine 5% patch to back/neck now and Q day. NPO except ice chips and sips with meds.      Intervention Category Major Interventions: Other:  Lysle Dingwall 01/25/2022, 12:18 AM

## 2022-01-25 NOTE — Progress Notes (Signed)
Interval Updates  She is sleeping comfortably on arrival, does awaken to verbal stimuli. States she feels fine aside from her stomach causing issues. She has not had any more emesis. She denies persistent chest pain.   General: pleasant elderly female, ill-appearing, laying in bed, NAD. HENT: NG tube in place. CV: normal rate and regular rhythm. Pulm: normal WOB, no respiratory distress noted. Abdomen: soft, nondistended, no TTP. BS normoactive. Skin: scar from pacemaker in infraclavicular area. Neuro: AAOx3, no focal deficits noted.   Notable Labs: -CBC stable in comparison to morning labs. -BNP increased to 2900s. -lactate down-trending (3.5 > 2.3) -CMP with stable kidney function in comparison to morning labs. -elevated liver enzymes (last normal a year ago) -hs troponins 42 >> 1059   Problems: Troponin elevation likely due to demand ischemia Paroxysmal atrial fibrillation s/p pacemaker and loop recorder 11/3 DIC Hx CAD and Carotid disease Patient with N/V and abdominal pain. Troponins increased to 1000s, likely demand in nature. Continue to trend troponins. She does have DIC so unable to provide anticoagulation. Continue med management.  Septic Shock due to staph bacteremia Lactic Acidosis from septic shock -cefazolin -ID following, appreciate recs -repeat blood cx -will need TEE to r/o endocarditis -if increasing WBC count, abd distension, ongoing diarrhea > repeat C diff testing

## 2022-01-25 NOTE — Consult Note (Signed)
[name]    Date of Admission:  01/24/2022     Reason for Consult: mssa bacteremia    Referring Provider: Terressa Koyanagi    Lines:  11/22-c right ij cvc  11/03-c new pacer  Abx: 11/22-c cefazolin 11/23-c prophy vanco PO        Assessment: Mssa bacteremia Severe sepsis with septic shock S/p permanent pacemaker placement Assymptomatic bacteriuria Hx recurrent cdiff; previously severe presentation Troponinemia Aki Elevated lft Elevated inr -- dic r/o'ed Dm2 ckd3  88 yo female with dm2 hx right tma, ckd3, sinus arrest s/p pacer 11/03 admitted for 1 day chill, n/v/diarrhea, chest-abd pain, found to have mssa septic shock, briefly requiring pressor now off on HD#1  11/22 bcx mssa 11/22 ucx <50k ecoli/pseudomonas. I do not think she has GU infection, and this is all mssa sepsis 11/23 stool cdiff testing negative toxin/antigen  11/22 EKG sinus rhythm without pacer activity 11/22 Ct chest abd pelv showed sign of pulm edema as well and bilateral perinephric stranding 11/23 tte no obvious new eccentric regurg or tell tale valve changes for endocarditis   Very concerned for pacer infection/involvement with mssa bacteremia. Will need EP input   I am not sure what to make of her abd pain/n/v/diarrhea yet. The diarrhea had resolved on HD#1. Her abd seems distended and an ngt was placed 11/23 but kub do not suggest illeus or obstruction. As the cdiff toxin/antigen is negative, I will observe her a bit more before determining if I would pull the trigger to retest cdiff or treat empirically for it. I would hold on cdiff prophylaxis at this time for this reason   Plan: Continue cefazolin renally dosed Repeat bcx tee Please discuss with ep about mssa and pacer -- even with negative tee would be difficult to r/o pacer involvement If increasing wbc count, abd distension and ongoing diarrhea, would repeat cdiff testing  Discussed with  primary team    I spent 75 minute reviewing data/chart, and coordinating care and >50% direct face to face time providing counseling/discussing diagnostics/treatment plan with patient   ------------------------------------------------ Principal Problem:   Sepsis ([place]) Active Problems:   AKI (acute kidney injury) ([employer])    HPI: Shelby Walker is a 88 y.o. female cdiff colitis history, s/p recent pacemaker placement 11/03 for sinus arrest, admitted 11/22 for acute ams/septic shock after complaining of chest pain the night prior, found to have mssa bacteremia  Patient intubated Hx via chart review/discussion with primary team  Patient had pacer placement 11/03 and uneventful course She complained of chest pain the night prior but the mornign of admission found to be altered/difficulty aroused admitted  Febrile to 103 Bp 80s-90s briefly needed pressors Wbc 20 Trops very high EKG no sign of nstemi Inr up Lft up Dic panel negative Intubated Ct showed pulm edema/pleural effusion & perinephric stranding Bcx mssa within 24 hours Ucx <50k ecoli/pseudomonas Started mssa treatment   Currently off pressors; ngt placed; kub no illeus; slight improved mentation   Reviewed previous id note in 08/2018: "Shelby Walker was admitted to the hospital from 07/24/18 until 08/06/18 with infectious colitis from which she was treated as an outpatient for 7 days with ciprofloxacin and metronidazole without improvement in her symptoms. She was having 8 bowel movements per day with decreased oral intake. On admission found to be febrile with temp of 100.5 and tachycardic. WBC count was 15.3 with Sars-Cov-2 negative. CT of the abdomen had diffuse  colitis without perforation. Her stool was tested for C. Difficile and found to have positive antigen, toxin and PCR. GI panel was negative. She was treated with oral vancomycin for 14 days at 500 mg. She was discharged to rehabilitation with improved symptoms.    Shelby Walker  was discharged from rehabilitation on 08/23/18 and began having diarrhea again 2-3 days ago. She was started on oral vancomycin without improvement. Now admitted with sharp, cramping abdominal pain in her lower abdomen with worsening diarrhea. Described as mucousy, and non-bloody. WBC count of 15.1 and creatinine of 1.95. CT scan of the abdomin and pelvis with: Diffusely thickened and surrounding inflammatory fat stranding.  Findings are worsening in comparison to prior exam on 07/24/2018 with concern for infectious, inflammatory, or ischemic colitis.   Stool sent for testing with C. Difficile with positive antigen, toxin and PCR test. Previous test 9 days ago was negative for C. Difficile. Current antimicrobial therapy with oral vancomycin 125 mg 4 times daily." She had tapered po vanc for this cdiff episode thought to be recurrent    Family History  Problem Relation Age of Onset   Heart failure Father    Heart attack Father    Diabetes Father    Heart attack Mother    Heart failure Mother    Diabetes Mother    Stroke Sister     Social History   Tobacco Use   Smoking status: Never   Smokeless tobacco: Never  Vaping Use   Vaping Use: Never used  Substance Use Topics   Alcohol use: No    Alcohol/week: 0.0 standard drinks of alcohol   Drug use: No    Allergies  Allergen Reactions   Cholestyramine     Possible- Makes throat burn. Patient said she can't take it    Compazine [Prochlorperazine Edisylate] Swelling    REACTION: " tongue swell and unable to swallow"   Sulfonamide Derivatives Other (See Comments)    REACTION: " broke out with fine itching bumps"   Hydrocodone Other (See Comments)   Infliximab Other (See Comments)    Other reaction(s): rash   Leflunomide     Other reaction(s): diarrhea   Prochlorperazine Other (See Comments)    Other reaction(s): Unknown   Rosuvastatin     Other reaction(s): muscle aches   Sulfa Antibiotics Other (See Comments)    Other  reaction(s): tongue swelling    Review of Systems: ROS All Other ROS was negative, except mentioned above   Past Medical History:  Diagnosis Date   Bruit    Carotid Doppler showed no significant abnormality     9per patient)   C. difficile colitis 07/2018   with severe sepsis   Chest pain, unspecified    Nuclear, May, 2008, no scar or ischemia   Diabetes mellitus    Diverticulosis    Dyslipidemia    Ejection fraction    EF 55-60%, echo, February, 2011 // Echocardiogram 8/21: EF 60-65, no RWMA, Gr 1 DD, GLS -14%, normal RVSF, mild LAE, trivial MR, mild MS (mean 4 mmHg), RVSP 23.4    GERD (gastroesophageal reflux disease)    Mitral regurgitation April 21, 2009   mild,  echo, February, 2011   Osteoporosis    Palpitations    possible very brief atrial fibrillation on monitor and possible reentrant tachycardia   Psoriasis    Rheumatoid arthritis ([place])        Scheduled Meds:  Chlorhexidine Gluconate Cloth  6 each Topical Daily   heparin injection (  subcutaneous)  5,000 Units Subcutaneous Q8H   hydrocortisone sod succinate (SOLU-CORTEF) inj  100 mg Intravenous Q8H   insulin aspart  0-9 Units Subcutaneous Q4H   lidocaine  1 patch Transdermal Q24H   mouth rinse  15 mL Mouth Rinse 4 times per day   pantoprazole  40 mg Oral Daily   Continuous Infusions:  sodium chloride      ceFAZolin (ANCEF) IV Stopped (01/25/22 0932)   dextrose 5% lactated ringers 50 mL/hr at 01/25/22 1200   norepinephrine (LEVOPHED) Adult infusion 5 mcg/min (01/25/22 1200)   PRN Meds:.acetaminophen, docusate sodium, fentaNYL (SUBLIMAZE) injection, ondansetron (ZOFRAN) IV, mouth rinse, polyethylene glycol   OBJECTIVE: Blood pressure 127/60, pulse 79, temperature 97.7 F (36.5 C), temperature source Oral, resp. rate 18, height '5\' 1"'$  (1.549 m), weight 53.6 kg, SpO2 (!) 85 %.  Physical Exam  General/constitutional: ngt in place, opens eyes briefly and some verbal, but appears in mild-moderate distress  complaining of abd pain HEENT: Normocephalic, PER, Conj Clear, EOMI, Oropharynx clear Neck supple CV: rrr no mrg; pacemaker site no erythema/fluctuance Lungs: clear to auscultation, normal respiratory effort Abd: distended, mildly tender; no rebound or guarding Ext: no edema Skin: No Rash Neuro: nonfocal MSK: no peripheral joint swelling/tenderness/warmth; hx right tma; no spine tenderness   Lab Results Lab Results  Component Value Date   WBC 20.8 (H) 01/25/2022   HGB 8.6 (L) 01/25/2022   HCT 27.1 (L) 01/25/2022   MCV 95.4 01/25/2022   PLT 125 (L) 01/25/2022    Lab Results  Component Value Date   CREATININE 2.53 (H) 01/25/2022   BUN 50 (H) 01/25/2022   NA 135 01/25/2022   K 4.8 01/25/2022   CL 103 01/25/2022   CO2 18 (L) 01/25/2022    Lab Results  Component Value Date   ALT 155 (H) 01/25/2022   AST 250 (H) 01/25/2022   ALKPHOS 61 01/25/2022   BILITOT 0.5 01/25/2022      Microbiology: Recent Results (from the past 240 hour(s))  Resp Panel by RT-PCR (Flu A&B, Covid) Anterior Nasal Swab     Status: None   Collection Time: 01/24/22  9:23 AM   Specimen: Anterior Nasal Swab  Result Value Ref Range Status   SARS Coronavirus 2 by RT PCR NEGATIVE NEGATIVE Final    Comment: (NOTE) SARS-CoV-2 target nucleic acids are NOT DETECTED.  The SARS-CoV-2 RNA is generally detectable in upper respiratory specimens during the acute phase of infection. The lowest concentration of SARS-CoV-2 viral copies this assay can detect is 138 copies/mL. A negative result does not preclude SARS-Cov-2 infection and should not be used as the sole basis for treatment or other patient management decisions. A negative result may occur with  improper specimen collection/handling, submission of specimen other than nasopharyngeal swab, presence of viral mutation(s) within the areas targeted by this assay, and inadequate number of viral copies(<138 copies/mL). A negative result must be combined  with clinical observations, patient history, and epidemiological information. The expected result is Negative.  Fact Sheet for Patients:  EntrepreneurPulse.com.au  Fact Sheet for Healthcare Providers:  IncredibleEmployment.be  This test is no t yet approved or cleared by the [place] FDA and  has been authorized for detection and/or diagnosis of SARS-CoV-2 by FDA under an Emergency Use Authorization (EUA). This EUA will remain  in effect (meaning this test can be used) for the duration of the COVID-19 declaration under Section 564(b)(1) of the Act, 21 U.S.C.section 360bbb-3(b)(1), unless the authorization is terminated  or revoked sooner.  Influenza A by PCR NEGATIVE NEGATIVE Final   Influenza B by PCR NEGATIVE NEGATIVE Final    Comment: (NOTE) The Xpert Xpress SARS-CoV-2/FLU/RSV plus assay is intended as an aid in the diagnosis of influenza from Nasopharyngeal swab specimens and should not be used as a sole basis for treatment. Nasal washings and aspirates are unacceptable for Xpert Xpress SARS-CoV-2/FLU/RSV testing.  Fact Sheet for Patients: EntrepreneurPulse.com.au  Fact Sheet for Healthcare Providers: IncredibleEmployment.be  This test is not yet approved or cleared by the [place] FDA and has been authorized for detection and/or diagnosis of SARS-CoV-2 by FDA under an Emergency Use Authorization (EUA). This EUA will remain in effect (meaning this test can be used) for the duration of the COVID-19 declaration under Section 564(b)(1) of the Act, 21 U.S.C. section 360bbb-3(b)(1), unless the authorization is terminated or revoked.  Performed at [facility], [place] [address], [place], [place] [address]   Blood Culture (routine x 2)     Status: Abnormal (Preliminary result)   Collection Time: 01/24/22  9:23 AM   Specimen: BLOOD  Result Value Ref Range Status   Specimen  Description BLOOD SITE NOT SPECIFIED  Final   Special Requests   Final    BOTTLES DRAWN AEROBIC AND ANAEROBIC Blood Culture adequate volume   Culture  Setup Time   Final    GRAM POSITIVE COCCI IN PAIRS IN CLUSTERS IN BOTH AEROBIC AND ANAEROBIC BOTTLES CRITICAL RESULT CALLED TO, READ BACK BY AND VERIFIED WITH: T RUDISILL,PHARMD'@0004'$  01/25/22 [place] Performed at [facility], [place] [address], [place], [place] [address]    Culture STAPHYLOCOCCUS AUREUS (A)  Final   Report Status PENDING  Incomplete  Urine Culture     Status: Abnormal (Preliminary result)   Collection Time: 01/24/22  9:23 AM   Specimen: In/Out Cath Urine  Result Value Ref Range Status   Specimen Description IN/OUT CATH URINE  Final   Special Requests NONE  Final   Culture (A)  Final    40,000 COLONIES/mL ESCHERICHIA COLI 40,000 COLONIES/mL PSEUDOMONAS AERUGINOSA SUSCEPTIBILITIES TO FOLLOW Performed at [facility], [place] [address], [place], [place] [address]    Report Status PENDING  Incomplete  Blood Culture ID Panel (Reflexed)     Status: Abnormal   Collection Time: 01/24/22  9:23 AM  Result Value Ref Range Status   Enterococcus faecalis NOT DETECTED NOT DETECTED Final   Enterococcus Faecium NOT DETECTED NOT DETECTED Final   Listeria monocytogenes NOT DETECTED NOT DETECTED Final   Staphylococcus species DETECTED (A) NOT DETECTED Final    Comment: CRITICAL RESULT CALLED TO, READ BACK BY AND VERIFIED WITH: T RUDISILL,PHARMD'@0004'$  01/25/22 [place]    Staphylococcus aureus (BCID) DETECTED (A) NOT DETECTED Final    Comment: CRITICAL RESULT CALLED TO, READ BACK BY AND VERIFIED WITH: T RUDISILL,PHARMD'@0004'$  01/25/22 [place]    Staphylococcus epidermidis NOT DETECTED NOT DETECTED Final   Staphylococcus lugdunensis NOT DETECTED NOT DETECTED Final   Streptococcus species NOT DETECTED NOT DETECTED Final   Streptococcus agalactiae NOT DETECTED NOT DETECTED Final   Streptococcus pneumoniae NOT DETECTED NOT DETECTED Final    Streptococcus pyogenes NOT DETECTED NOT DETECTED Final   A.calcoaceticus-baumannii NOT DETECTED NOT DETECTED Final   Bacteroides fragilis NOT DETECTED NOT DETECTED Final   Enterobacterales NOT DETECTED NOT DETECTED Final   Enterobacter cloacae complex NOT DETECTED NOT DETECTED Final   Escherichia coli NOT DETECTED NOT DETECTED Final   Klebsiella aerogenes NOT DETECTED NOT DETECTED Final   Klebsiella oxytoca NOT  DETECTED NOT DETECTED Final   Klebsiella pneumoniae NOT DETECTED NOT DETECTED Final   Proteus species NOT DETECTED NOT DETECTED Final   Salmonella species NOT DETECTED NOT DETECTED Final   Serratia marcescens NOT DETECTED NOT DETECTED Final   Haemophilus influenzae NOT DETECTED NOT DETECTED Final   Neisseria meningitidis NOT DETECTED NOT DETECTED Final   Pseudomonas aeruginosa NOT DETECTED NOT DETECTED Final   Stenotrophomonas maltophilia NOT DETECTED NOT DETECTED Final   Candida albicans NOT DETECTED NOT DETECTED Final   Candida auris NOT DETECTED NOT DETECTED Final   Candida glabrata NOT DETECTED NOT DETECTED Final   Candida krusei NOT DETECTED NOT DETECTED Final   Candida parapsilosis NOT DETECTED NOT DETECTED Final   Candida tropicalis NOT DETECTED NOT DETECTED Final   Cryptococcus neoformans/gattii NOT DETECTED NOT DETECTED Final   Meth resistant mecA/C and MREJ NOT DETECTED NOT DETECTED Final    Comment: Performed at [facility], [place] [address] [place], [place] [address]  Blood Culture (routine x 2)     Status: Abnormal (Preliminary result)   Collection Time: 01/24/22  9:28 AM   Specimen: BLOOD  Result Value Ref Range Status   Specimen Description BLOOD SITE NOT SPECIFIED  Final   Special Requests   Final    BOTTLES DRAWN AEROBIC AND ANAEROBIC Blood Culture results may not be optimal due to an excessive volume of blood received in culture bottles   Culture  Setup Time   Final    GRAM POSITIVE COCCI IN PAIRS IN BOTH AEROBIC AND ANAEROBIC BOTTLES CRITICAL VALUE  NOTED.  VALUE IS CONSISTENT WITH PREVIOUSLY REPORTED AND CALLED VALUE. Performed at [facility], [place] [address] [place], [place] [address]    Culture STAPHYLOCOCCUS AUREUS (A)  Final   Report Status PENDING  Incomplete  Gastrointestinal Panel by PCR , Stool     Status: None   Collection Time: 01/24/22 10:36 AM   Specimen: Stool  Result Value Ref Range Status   Campylobacter species NOT DETECTED NOT DETECTED Final   Plesimonas shigelloides NOT DETECTED NOT DETECTED Final   Salmonella species NOT DETECTED NOT DETECTED Final   Yersinia enterocolitica NOT DETECTED NOT DETECTED Final   Vibrio species NOT DETECTED NOT DETECTED Final   Vibrio cholerae NOT DETECTED NOT DETECTED Final   Enteroaggregative E coli (EAEC) NOT DETECTED NOT DETECTED Final   Enteropathogenic E coli (EPEC) NOT DETECTED NOT DETECTED Final   Enterotoxigenic E coli (ETEC) NOT DETECTED NOT DETECTED Final   Shiga like toxin producing E coli (STEC) NOT DETECTED NOT DETECTED Final   Shigella/Enteroinvasive E coli (EIEC) NOT DETECTED NOT DETECTED Final   Cryptosporidium NOT DETECTED NOT DETECTED Final   Cyclospora cayetanensis NOT DETECTED NOT DETECTED Final   Entamoeba histolytica NOT DETECTED NOT DETECTED Final   Giardia lamblia NOT DETECTED NOT DETECTED Final   Adenovirus F40/41 NOT DETECTED NOT DETECTED Final   Astrovirus NOT DETECTED NOT DETECTED Final   Norovirus GI/GII NOT DETECTED NOT DETECTED Final   Rotavirus A NOT DETECTED NOT DETECTED Final   Sapovirus (I, II, IV, and V) NOT DETECTED NOT DETECTED Final    Comment: Performed at [facility], [place] [place], [place] [address]  MRSA Next Gen by PCR, Nasal     Status: None   Collection Time: 01/24/22 11:00 PM   Specimen: Nasal Mucosa; Nasal Swab  Result Value Ref Range Status   MRSA by PCR Next Gen NOT DETECTED NOT DETECTED Final    Comment: (NOTE)  The GeneXpert MRSA Assay (FDA approved for NASAL specimens only), is one component of a  comprehensive MRSA colonization surveillance program. It is not intended to diagnose MRSA infection nor to guide or monitor treatment for MRSA infections. Test performance is not FDA approved in patients less than 70 years old. Performed at [facility], [place] [address], [place], [place] [address]   C Difficile Quick Screen w PCR reflex     Status: None   Collection Time: 01/25/22  5:14 AM   Specimen: STOOL  Result Value Ref Range Status   C Diff antigen NEGATIVE NEGATIVE Final   C Diff toxin NEGATIVE NEGATIVE Final   C Diff interpretation No C. difficile detected.  Final    Comment: Performed at [facility], [place] [address], [place],  [address]     Serology:    Imaging: If present, new imagings (plain films, ct scans, and mri) have been personally visualized and interpreted; radiology reports have been reviewed. Decision making incorporated into the Impression / Recommendations.  11/22 abd pelv chest ct 1. Bilateral perinephric fat stranding, suggesting pyelonephritis. No urinary tract calculi or hydronephrosis. Correlate with urinalysis. 2. Small bilateral pleural effusions, diffuse bilateral bronchial wall thickening, and interlobular septal thickening, consistent with pulmonary edema. 3. Cardiomegaly and coronary artery disease. 4. Tiny gallstones or calcified sludge in the gallbladder fundus. No evidence of acute cholecystitis. 5. Sigmoid diverticulosis without evidence of acute diverticulitis  11/23 cxr 1. Minimal ill-defined opacity at the left lung base has an appearance favoring atelectasis. No definite airspace consolidation. 2. Mild cardiomegaly. 3. Central pulmonary vascular congestion without overt pulmonary edema. 4. Trace bilateral pleural effusions. 5. Aortic Atherosclerosis   11/23 tte  1. Left ventricular ejection fraction, by estimation, is 50 to 55%. The  left ventricle has low normal function. The left ventricle has no regional   wall motion abnormalities. Left ventricular diastolic parameters are  consistent with Grade II diastolic  dysfunction (pseudonormalization).   2. Right ventricular systolic function is normal. The right ventricular  size is normal. There is normal pulmonary artery systolic pressure. The  estimated right ventricular systolic pressure is 46.6 mmHg.   3. Left atrial size was moderately dilated.   4. The mitral valve is degenerative. Mild mitral valve regurgitation. The  mean mitral valve gradient is 4.0 mmHg with average heart rate of 77 bpm.   5. The aortic valve is tricuspid. There is mild calcification of the  aortic valve. There is mild thickening of the aortic valve. Aortic valve  regurgitation is not visualized. Aortic valve sclerosis is present, with  no evidence of aortic valve stenosis.   6. The inferior vena cava is dilated in size with <50% respiratory  variability, suggesting right atrial pressure of 15 mmHg.   Comparison(s): New ICD. EF is less vigorous than prior.    11/23 kub 1. Paucity of bowel gas as on the CT Abdomen and Pelvis yesterday. No strong imaging evidence of bowel obstruction. 2. Small bilateral pleural effusions.  Jabier Mutton, [facility] [place] [phone] pager    01/25/2022, 1:50 PM

## 2022-01-26 DIAGNOSIS — Z950 Presence of cardiac pacemaker: Secondary | ICD-10-CM | POA: Diagnosis not present

## 2022-01-26 DIAGNOSIS — R6521 Severe sepsis with septic shock: Secondary | ICD-10-CM | POA: Diagnosis not present

## 2022-01-26 DIAGNOSIS — T827XXA Infection and inflammatory reaction due to other cardiac and vascular devices, implants and grafts, initial encounter: Secondary | ICD-10-CM

## 2022-01-26 DIAGNOSIS — M545 Low back pain, unspecified: Secondary | ICD-10-CM | POA: Diagnosis not present

## 2022-01-26 DIAGNOSIS — A419 Sepsis, unspecified organism: Secondary | ICD-10-CM | POA: Diagnosis not present

## 2022-01-26 LAB — CULTURE, BLOOD (ROUTINE X 2): Special Requests: ADEQUATE

## 2022-01-26 LAB — PROCALCITONIN: Procalcitonin: 30.42 ng/mL

## 2022-01-26 LAB — BASIC METABOLIC PANEL WITH GFR
Anion gap: 18 — ABNORMAL HIGH (ref 5–15)
BUN: 54 mg/dL — ABNORMAL HIGH (ref 8–23)
CO2: 21 mmol/L — ABNORMAL LOW (ref 22–32)
Calcium: 8.1 mg/dL — ABNORMAL LOW (ref 8.9–10.3)
Chloride: 103 mmol/L (ref 98–111)
Creatinine, Ser: 2.26 mg/dL — ABNORMAL HIGH (ref 0.44–1.00)
GFR, Estimated: 21 mL/min — ABNORMAL LOW
Glucose, Bld: 151 mg/dL — ABNORMAL HIGH (ref 70–99)
Potassium: 3.7 mmol/L (ref 3.5–5.1)
Sodium: 142 mmol/L (ref 135–145)

## 2022-01-26 LAB — LACTIC ACID, PLASMA: Lactic Acid, Venous: 1.8 mmol/L (ref 0.5–1.9)

## 2022-01-26 LAB — GLUCOSE, CAPILLARY
Glucose-Capillary: 148 mg/dL — ABNORMAL HIGH (ref 70–99)
Glucose-Capillary: 157 mg/dL — ABNORMAL HIGH (ref 70–99)
Glucose-Capillary: 122 mg/dL — ABNORMAL HIGH (ref 70–99)
Glucose-Capillary: 158 mg/dL — ABNORMAL HIGH (ref 70–99)
Glucose-Capillary: 106 mg/dL — ABNORMAL HIGH (ref 70–99)
Glucose-Capillary: 104 mg/dL — ABNORMAL HIGH (ref 70–99)

## 2022-01-26 LAB — MAGNESIUM: Magnesium: 2.9 mg/dL — ABNORMAL HIGH (ref 1.7–2.4)

## 2022-01-26 LAB — CBC
HCT: 24.3 % — ABNORMAL LOW (ref 36.0–46.0)
Hemoglobin: 8 g/dL — ABNORMAL LOW (ref 12.0–15.0)
MCH: 29.7 pg (ref 26.0–34.0)
MCHC: 32.9 g/dL (ref 30.0–36.0)
MCV: 90.3 fL (ref 80.0–100.0)
Platelets: 108 K/uL — ABNORMAL LOW (ref 150–400)
RBC: 2.69 MIL/uL — ABNORMAL LOW (ref 3.87–5.11)
RDW: 15.9 % — ABNORMAL HIGH (ref 11.5–15.5)
WBC: 16 K/uL — ABNORMAL HIGH (ref 4.0–10.5)
nRBC: 0 % (ref 0.0–0.2)

## 2022-01-26 LAB — URINE CULTURE: Culture: 40000 — AB

## 2022-01-26 LAB — PHOSPHORUS: Phosphorus: 5.4 mg/dL — ABNORMAL HIGH (ref 2.5–4.6)

## 2022-01-26 MED ORDER — RENA-VITE PO TABS
1.0000 | ORAL_TABLET | Freq: Every day | ORAL | Status: DC
  Administered 2022-01-26 – 2022-02-01 (×7): 1 via ORAL
  Filled 2022-01-26 (×7): qty 1

## 2022-01-26 MED ORDER — SACCHAROMYCES BOULARDII 250 MG PO CAPS
250.0000 mg | ORAL_CAPSULE | Freq: Two times a day (BID) | ORAL | Status: DC
  Administered 2022-01-26 – 2022-02-02 (×14): 250 mg via ORAL
  Filled 2022-01-26 (×15): qty 1

## 2022-01-26 MED ORDER — POTASSIUM CHLORIDE CRYS ER 20 MEQ PO TBCR
40.0000 meq | EXTENDED_RELEASE_TABLET | Freq: Once | ORAL | Status: AC
  Administered 2022-01-26: 40 meq via ORAL
  Filled 2022-01-26: qty 2

## 2022-01-26 MED ORDER — HYDROCORTISONE SOD SUC (PF) 100 MG IJ SOLR
50.0000 mg | Freq: Two times a day (BID) | INTRAMUSCULAR | Status: DC
  Administered 2022-01-26 – 2022-01-27 (×2): 50 mg via INTRAVENOUS
  Filled 2022-01-26 (×2): qty 1

## 2022-01-26 MED ORDER — BOOST PLUS PO LIQD
237.0000 mL | Freq: Two times a day (BID) | ORAL | Status: DC
  Administered 2022-01-26 – 2022-01-31 (×8): 237 mL via ORAL
  Filled 2022-01-26 (×15): qty 237

## 2022-01-26 MED ORDER — SODIUM CHLORIDE 0.9 % IV SOLN: INTRAVENOUS | Status: AC

## 2022-01-26 MED ORDER — CEFAZOLIN SODIUM-DEXTROSE 2-4 GM/100ML-% IV SOLN
2.0000 g | Freq: Two times a day (BID) | INTRAVENOUS | Status: DC
  Administered 2022-01-26 – 2022-02-02 (×14): 2 g via INTRAVENOUS
  Filled 2022-01-26 (×16): qty 100

## 2022-01-26 MED ORDER — SODIUM CHLORIDE 0.9% FLUSH
3.0000 mL | Freq: Two times a day (BID) | INTRAVENOUS | Status: DC
  Administered 2022-01-26 – 2022-02-01 (×10): 3 mL via INTRAVENOUS

## 2022-01-26 MED ORDER — METOCLOPRAMIDE HCL 5 MG/ML IJ SOLN
5.0000 mg | Freq: Once | INTRAMUSCULAR | Status: AC | PRN
  Administered 2022-01-26: 5 mg via INTRAVENOUS
  Filled 2022-01-26: qty 2

## 2022-01-26 NOTE — Consult Note (Addendum)
Cardiology Consultation   Patient ID: Shelby Walker MRN: [id number]; DOB: 02-Apr-1933  Admit date: 01/24/2022 Date of Consult: 01/26/2022  PCP:  Reynold Bowen, [place] Prov[facility] Cardiologist:  Mertie Moores, MD  Electrophysiologist:  Virl Axe, MD  {   Patient Profile:   Shelby Walker is a 88 y.o. female with a hx of CAD, diastolic dysfunction, H7CB, HLD, parox afib, CKD, RA (on chronic steroids)  who is being seen 01/26/2022 for the evaluation of MSSA bacteremia w/PPM at the request of Dr. Broadus John.  History of Present Illness:   [name] recently has ILR for Afib  burden, was found to have pauses, one as long as 25 seconds reportedly asymptomatic, though also apparently quite ill with COVID, spending much of her time in bed sleeping and subsequently recovered from [place].   Admitted to the hospital found with no reversible causes and underwent PPM implant 01/05/22 by Dr. Lovena Le and discharged 01/06/22.  She had her wound check visit, wound reported to be healing well without signs of infection on 01/18/22.  Admitted 01/24/22 with a day of reported abd pain, N/V/D developed AMS, hypoxic, septic, felt 2/2 pyelonephritis, seen on imaging as well as dirty UA with pyuria and leukocytes.  Lactic acid elevated. Required vasopressors.  AKI/CKD, hypoxia felt to be spurious reading, VQ was negative, and weaning O2 quickly, found with some pleural effusions. She was febrile to 103  ID consulted yesterday, MSSA bacteremia, did NOT think she had GU infection. Neg c.Diff (with this historically).  Concerns of pacer involvement and requested EP input. Unclear abdominal connection. With neg KUB > NGT (now out) Planned to follow this clinically and consider retesting Cdiff if needed or treat emperically. Planned for TEE though even if negative, concerns of pacer  Also noted w/ DIC w/u in progress Discussion by CCM abnormal Trops in d/w cardiology likely demand 2/2 AKI and  shock/sepsis, unable to anticoagulate 2/2 DIC and recommended ongoing trop monitoring trend Yesterday off O2 and pressor BAd better with NGT Started on diuretic  PARTIAL CODE, OK for intubation but NOT CPR  LABS K+ 3.9 >>> 3.7 BUN/Creat 47/2.21 > 2.67 > 2.53 > 2.26 (baseline 1.3) BNP 1004.5 >>> 2919.6 Lactic acid 2. 8 > 3.5 > 2.3 > 1.8 WBC 18.3 > 21.3 > 20.8 > 16.0 Hgb 9.1 > 8.5 > 8.4 > 8.6 . 8.0 (baseline 9's) Hct 28.2 > 25 > 27 > 27 . 24.3 Plts 150 > 131 > 123 > 125 > 108 HS Trop 42,  1059 > 1789 > 1991  01/25/22 DDimer 13.22 Fibrinogen 533 PT 22.5 INR 2.0 PTT 45  01/24/22 BC 2of2 w/staph (MSSA by ID note)  Urine w/ecoli and pseudomonas   Pt is resting comfortably, family at bedside  Past Medical History:  Diagnosis Date   Bruit    Carotid Doppler showed no significant abnormality     9per patient)   C. difficile colitis 07/2018   with severe sepsis   Chest pain, unspecified    Nuclear, May, 2008, no scar or ischemia   Diabetes mellitus    Diverticulosis    Dyslipidemia    Ejection fraction    EF 55-60%, echo, February, 2011 // Echocardiogram 8/21: EF 60-65, no RWMA, Gr 1 DD, GLS -14%, normal RVSF, mild LAE, trivial MR, mild MS (mean 4 mmHg), RVSP 23.4    GERD (gastroesophageal reflux disease)    Mitral regurgitation April 21, 2009   mild,  echo, February, 2011  Osteoporosis    Palpitations    possible very brief atrial fibrillation on monitor and possible reentrant tachycardia   Psoriasis    Rheumatoid arthritis [facility])     Past Surgical History:  Procedure Laterality Date   ABDOMINAL AORTOGRAM W/LOWER EXTREMITY Right 10/19/2020   Procedure: ABDOMINAL AORTOGRAM W/LOWER EXTREMITY;  Surgeon: Wellington Hampshire, MD;  Location: [place] [facility];  Service: Cardiovascular;  Laterality: Right;   FRACTURE SURGERY     LOOP RECORDER REMOVAL N/A 01/05/2022   Procedure: LOOP RECORDER REMOVAL;  Surgeon: Evans Lance, MD;  Location: [place] [facility];  Service:  Cardiovascular;  Laterality: N/A;   PACEMAKER IMPLANT N/A 01/05/2022   Procedure: PACEMAKER IMPLANT;  Surgeon: Evans Lance, MD;  Location: [place] [facility];  Service: Cardiovascular;  Laterality: N/A;   TRANSMETATARSAL AMPUTATION Right 01/18/2021   Procedure: TRANSMETATARSAL AMPUTATION;  Surgeon: Wylene Simmer, MD;  Location: [place];  Service: Orthopedics;  Laterality: Right;     Home Medications:  Prior to Admission medications   Medication Sig Start Date End Date Taking? Authorizing Provider  acetaminophen (TYLENOL) 650 MG CR tablet Take 1,300 mg by mouth every 8 (eight) hours as needed for pain.   Yes [provider]  acetaminophen-codeine (TYLENOL #3) 300-30 MG tablet Take 1 tablet by mouth in the morning, at noon, and at bedtime. Patient taking differently: Take 1 tablet by mouth as needed for moderate pain. 02/06/21  Yes Love, Ivan Anchors, PA-C  atorvastatin (LIPITOR) 20 MG tablet Take 1 tablet (20 mg total) by mouth daily. 08/25/21  Yes Nahser, Wonda Cheng, MD  Calcium Carbonate-Vitamin D 600-400 MG-UNIT tablet Take 1 tablet by mouth daily at 12 noon.    Yes [provider]  carvedilol (COREG) 6.25 MG tablet TAKE 1 TABLET BY MOUTH TWICE DAILY WITH A MEAL 12/01/21  Yes Nahser, Wonda Cheng, MD  diclofenac Sodium (VOLTAREN) 1 % GEL Apply 2 g topically 4 (four) times daily. 02/06/21  Yes Love, Ivan Anchors, PA-C  esomeprazole (NEXIUM) 20 MG capsule Take 20 mg by mouth in the morning.   Yes [provider]  gabapentin (NEURONTIN) 100 MG capsule Take 1 capsule (100 mg total) by mouth 3 (three) times daily. 02/06/21  Yes Love, Ivan Anchors, PA-C  insulin aspart (NOVOLOG) 100 UNIT/ML FlexPen Inject 3 Units into the skin 3 (three) times daily with meals. Sliding scale Patient taking differently: Inject 6-8 Units into the skin 3 (three) times daily with meals. Sliding scale 02/06/21  Yes Love, Ivan Anchors, PA-C  Insulin Degludec (TRESIBA [place]) Inject 6-8 Units into the skin at bedtime.   Yes  [provider]  lip balm (CARMEX) ointment Apply topically as needed for lip care. 02/01/21  Yes Love, Ivan Anchors, PA-C  liver oil-zinc oxide (DESITIN) 40 % ointment Apply 1 application topically 3 (three) times daily. To raw areas in between/on buttocks 02/01/21  Yes Love, Ivan Anchors, PA-C  Multiple Vitamins-Minerals (CENTRUM SILVER 50+WOMEN PO) Take 1 tablet by mouth daily.   Yes [provider]  nitroGLYCERIN (NITROSTAT) 0.4 MG SL tablet Dissolve 1 tablet under the tongue every 5 minutes as needed for chest pain. Max of 3 doses, then 911. 12/20/21  Yes Nahser, Wonda Cheng, MD  Nutritional Supplements (ENSURE ACTIVE) LIQD Take 237 mLs by mouth in the morning. Prefers Chocolate   Yes [provider]  pyrithione zinc (SELSUN BLUE DRY SCALP) 1 % shampoo Apply 1 application topically once a week. 04/15/15  Yes [provider]  RESTASIS 0.05 % ophthalmic emulsion Place 1 drop into both eyes 2 (two) times daily. 02/13/19  Yes [provider]  saccharomyces boulardii (FLORASTOR) 250 MG capsule Take 250 mg by mouth 2 (two) times daily.   Yes [provider]  traMADol (ULTRAM) 50 MG tablet Take 50 mg by mouth 2 (two) times daily as needed for moderate pain or severe pain. 07/18/21  Yes [provider]    Inpatient Medications: Scheduled Meds:  Chlorhexidine Gluconate Cloth  6 each Topical Daily   hydrocortisone sod succinate (SOLU-CORTEF) inj  50 mg Intravenous Q12H   insulin aspart  0-9 Units Subcutaneous Q4H   lidocaine  1 patch Transdermal Q24H   mouth rinse  15 mL Mouth Rinse 4 times per day   pantoprazole  40 mg Oral Daily   Continuous Infusions:  sodium chloride Stopped (01/26/22 0212)    ceFAZolin (ANCEF) IV 2 g (01/26/22 0902)   PRN Meds: acetaminophen, docusate sodium, fentaNYL (SUBLIMAZE) injection, ondansetron (ZOFRAN) IV, mouth rinse, polyethylene glycol  Allergies:    Allergies  Allergen Reactions   Cholestyramine      Possible- Makes throat burn. Patient said she can't take it    Compazine [Prochlorperazine Edisylate] Swelling    REACTION: " tongue swell and unable to swallow"   Sulfonamide Derivatives Other (See Comments)    REACTION: " broke out with fine itching bumps"   Hydrocodone Other (See Comments)   Infliximab Other (See Comments)    Other reaction(s): rash   Leflunomide     Other reaction(s): diarrhea   Prochlorperazine Other (See Comments)    Other reaction(s): Unknown   Rosuvastatin     Other reaction(s): muscle aches   Sulfa Antibiotics Other (See Comments)    Other reaction(s): tongue swelling    Social History:   Social History   Socioeconomic History   Marital status: Widowed    Spouse name: [name]   Number of children: 2   Years of education: 13   Highest education level: Associate degree: academic program  Occupational History    Employer: RETIRED  Tobacco Use   Smoking status: Never   Smokeless tobacco: Never  Vaping Use   Vaping Use: Never used  Substance and Sexual Activity   Alcohol use: No    Alcohol/week: 0.0 standard drinks of alcohol   Drug use: No   Sexual activity: Yes    Birth control/protection: Post-menopausal  Other Topics Concern   Not on file  Social History Narrative   She is married to [place] and lives with him 2 children   Retired   No alcohol or drugs and never smoker   Social Determinants of Radio broadcast assistant Strain: Low Risk  (07/24/2018)   Overall Financial Resource Strain (CARDIA)    Difficulty of Paying Living Expenses: Not hard at all  Food Insecurity: No Food Insecurity (01/25/2022)   Hunger Vital Sign    Worried About Running Out of Food in the Last Year: Never true    [place] in the Last Year: Never true  Transportation Needs: No Transportation Needs (01/25/2022)   PRAPARE - Hydrologist (Medical): No    Lack of Transportation (Non-Medical): No  Physical Activity: Sufficiently  Active (07/24/2018)   Exercise Vital Sign    Days of Exercise per Week: 5 days    Minutes of Exercise per Session: 30 min  Stress: No Stress Concern Present (07/24/2018)   [place] -  Occupational Stress Questionnaire    Feeling of Stress : Not at all  Social Connections: Socially Integrated (07/24/2018)   Social Connection and Isolation Panel [NHANES]    Frequency of Communication with Friends and Family: More than three times a week    Frequency of Social Gatherings with Friends and Family: More than three times a week    Attends Religious Services: More than 4 times per year    Active Member of [employer] or Organizations: Yes    Attends Music therapist: More than 4 times per year    Marital Status: Married  Human resources officer Violence: Not At Risk (01/25/2022)   Humiliation, Afraid, Rape, and Kick questionnaire    Fear of Current or Ex-Partner: No    Emotionally Abused: No    Physically Abused: No    Sexually Abused: No    Family History:   Family History  Problem Relation Age of Onset   Heart failure Father    Heart attack Father    Diabetes Father    Heart attack Mother    Heart failure Mother    Diabetes Mother    Stroke Sister      ROS:  Please see the history of present illness.  All other ROS reviewed and negative.     Physical Exam/Data:   Vitals:   01/26/22 0630 01/26/22 0700 01/26/22 0800 01/26/22 0853  BP: (!) 140/63 119/66 (!) 110/53   Pulse: 82 78 74   Resp: '14 12 12   '$ Temp:    98 F (36.7 C)  TempSrc:    Oral  SpO2: 95% 95% 97%   Weight:      Height:        Intake/Output Summary (Last 24 hours) at 01/26/2022 1035 Last data filed at 01/26/2022 1000 Gross per 24 hour  Intake 1397.74 ml  Output 1150 ml  Net 247.74 ml      01/26/2022    4:22 AM 01/25/2022    5:00 AM 01/24/2022    9:21 AM  Last 3 Weights  Weight (lbs) 118 lb 2.7 oz 118 lb 2.7 oz 112 lb 7 oz  Weight (kg) 53.6 kg 53.6 kg 51 kg     Body  mass index is 22.33 kg/m.  General:  Thin female, in no acute distress HEENT: normal Neck: no JVD Vascular: No carotid bruits; Distal pulses 2+ bilaterally Cardiac:  RRR; no murmurs, gallops or rubs Lungs: diminshed at the bases, no wheezing, rhonchi or rales  Abd: soft, nontender Ext: no edema Musculoskeletal:  No deformities Skin: warm and dry  Neuro:  no focal abnormalities noted Psych:  Normal affect   EKG:  The EKG was personally reviewed and demonstrates:    ST 105, no clear,  acute/ischemic changes SR 77, nonspecific T inv V1-2  Telemetry:  Telemetry was personally reviewed and demonstrates:   SR, no pacing  Relevant CV Studies:   01/25/22: TTE 1. Left ventricular ejection fraction, by estimation, is 50 to 55%. The  left ventricle has low normal function. The left ventricle has no regional  wall motion abnormalities. Left ventricular diastolic parameters are  consistent with Grade II diastolic  dysfunction (pseudonormalization).   2. Right ventricular systolic function is normal. The right ventricular  size is normal. There is normal pulmonary artery systolic pressure. The  estimated right ventricular systolic pressure is 82.4 mmHg.   3. Left atrial size was moderately dilated.   4. The mitral valve is degenerative. Mild mitral valve regurgitation. The  mean mitral valve gradient is 4.0 mmHg with average heart rate of 77 bpm.   5. The aortic valve is tricuspid. There is mild calcification of the  aortic valve. There is mild thickening of the aortic valve. Aortic valve  regurgitation is not visualized. Aortic valve sclerosis is present, with  no evidence of aortic valve stenosis.   6. The inferior vena cava is dilated in size with <50% respiratory  variability, suggesting right atrial pressure of 15 mmHg.   Comparison(s): New device lead. EF is less vigorous than prior.   12/13/2021: Coronary CTa/FFR IMPRESSION: 1. Coronary calcium score of 426. Patient is out  of the age range for comparison.   2. Normal coronary origin with right dominance.   3. There is moderate (50-69%) mixed plaque in the left main and LAD. Can not rule out severe (>70%) splenosis in OM1. However this is also near the region of a significant step artifact and may be artifactual. CAD RADS 4. 4.  Will send study for FFR CT.  FFR 1. Left Main: FFRct 0l97.  No significant stenosis. 2. LAD: FFRct 0.92 proximal, 0.83 mid, 0.75 distal. No significant stenosis. 3. LCX: D1 FFRct 0.94 proximal, 0.61 mid. Consistent with significant stenosis. 4. RCA: FFRct 0.98 proximal, 0.91 mid, 0.86 distal. No significant stenosis. IMPRESSION: 1.  CT FFR analysis consistent with abnormal flow in D1. 2. Consider aggressive medical management vs further investigation with cardiac catheterization if appropriate.    Laboratory Data:  High Sensitivity Troponin:   Recent Labs  Lab 01/24/22 0935 01/25/22 1021 01/25/22 1453 01/25/22 1655  TROPONINIHS 42* 1,059* 1,789* 1,991*     Chemistry Recent Labs  Lab 01/25/22 0514 01/25/22 1021 01/25/22 1449 01/26/22 0522  NA 137 135 134* 142  K 4.8 4.8 4.5 3.7  CL 105 103 105 103  CO2 20* 18* 20* 21*  GLUCOSE 188* 183* 178* 151*  BUN 51* 50* 52* 54*  CREATININE 2.67* 2.53* 2.49* 2.26*  CALCIUM 7.3* 7.2* 7.2* 8.1*  MG 1.7 1.6*  --  2.9*  GFRNONAA 17* 18* 18* 21*  ANIONGAP '12 14 9 '$ 18*    Recent Labs  Lab 01/25/22 1021  PROT 4.6*  ALBUMIN 2.4*  AST 250*  ALT 155*  ALKPHOS 61  BILITOT 0.5   Lipids No results for input(s): "CHOL", "TRIG", "HDL", "LABVLDL", "LDLCALC", "CHOLHDL" in the last 168 hours.  Hematology Recent Labs  Lab 01/25/22 0514 01/25/22 0934 01/25/22 0936 01/26/22 0522  WBC 21.3* 20.8*  --  16.0*  RBC 2.88* 2.84*  --  2.69*  HGB 8.4* 8.6*  --  8.0*  HCT 27.9* 27.1*  --  24.3*  MCV 96.9 95.4  --  90.3  MCH 29.2 30.3  --  29.7  MCHC 30.1 31.7  --  32.9  RDW 16.5* 16.5*  --  15.9*  PLT 131* 123* 125* 108*    Thyroid No results for input(s): "TSH", "FREET4" in the last 168 hours.  BNP Recent Labs  Lab 01/24/22 0935 01/25/22 0934  BNP 1,004.5* 2,919.6*    DDimer  Recent Labs  Lab 01/25/22 0936  DDIMER 13.22*     Radiology/Studies:   DG Abd Portable 1V Result Date: 01/25/2022 CLINICAL DATA:  Evaluate nasogastric tube placement. EXAM: PORTABLE ABDOMEN - 1 VIEW COMPARISON:  Earlier today FINDINGS: Interval placement of a nasogastric tube. The tip and side port are both well below the level of the GE junction. The distal portion of the tube appears looped within the body of the  stomach. No dilated bowel loops identified. IMPRESSION: Interval placement of nasogastric tube with tip and side port well below the level of the GE junction. Electronically Signed   By: Kerby Moors M.D.   On: 01/25/2022 13:15   DG CHEST PORT 1 VIEW Result Date: 01/25/2022 CLINICAL DATA:  Provided history: Acute and chronic respiratory failure (acute on chronic). Shortness of breath, concern for aspiration. EXAM: PORTABLE CHEST 1 VIEW COMPARISON:  Prior chest radiographs 01/24/2022 and earlier. FINDINGS: Left chest multilead implantable cardiac device. Left IJ approach central venous catheter with tip terminating at the level of the lower SVC. Mild cardiomegaly, unchanged. Aortic atherosclerosis. Central pulmonary vascular congestion without overt pulmonary edema. Trace bilateral pleural effusions. Minimal ill-defined opacity at the left lung base has an appearance favoring atelectasis. No definite airspace consolidation. IMPRESSION: 1. Minimal ill-defined opacity at the left lung base has an appearance favoring atelectasis. No definite airspace consolidation. 2. Mild cardiomegaly. 3. Central pulmonary vascular congestion without overt pulmonary edema. 4. Trace bilateral pleural effusions. 5. Aortic Atherosclerosis (ICD10-I70.0). Electronically Signed   By: Kellie Simmering D.O.   On: 01/25/2022 11:53   DG Abd 1  View Result Date: 01/25/2022 CLINICAL DATA:  88 year old female with vomiting. EXAM: ABDOMEN - 1 VIEW COMPARISON:  Noncontrast CT Chest, Abdomen, and Pelvis yesterday. FINDINGS: Portable AP supine view at 1008 hours. Cardiac pacemaker redemonstrated. Small bilateral pleural effusions remain apparent. Ongoing paucity of bowel gas in the abdomen and pelvis. No significant change from the CT scout view yesterday. No pneumoperitoneum evident on these supine views. Stable visualized osseous structures. IMPRESSION: 1. Paucity of bowel gas as on the CT Abdomen and Pelvis yesterday. No strong imaging evidence of bowel obstruction. 2. Small bilateral pleural effusions. Electronically Signed   By: Genevie Ann M.D.   On: 01/25/2022 10:21    NM Pulmonary Perfusion Result Date: 01/24/2022 CLINICAL DATA:  Renal failure, diabetes mellitus, paroxysmal atrial fibrillation, hypertension, chest pain, question pulmonary embolism EXAM: NUCLEAR MEDICINE PERFUSION LUNG SCAN TECHNIQUE: Perfusion images were obtained in multiple projections after intravenous injection of radiopharmaceutical. Ventilation scans intentionally deferred if perfusion scan and chest x-ray adequate for interpretation during COVID 19 epidemic. RADIOPHARMACEUTICALS:  4.2 mCi Tc-63mMAA IV COMPARISON:  01/24/2022. FINDINGS: Normal perfusion lung scan. No perfusion defects. IMPRESSION: Normal perfusion lung scan. Electronically Signed   By: MLavonia DanaM.D.   On: 01/24/2022 14:49   CT CHEST ABDOMEN PELVIS WO CONTRAST Result Date: 01/24/2022 CLINICAL DATA:  Sepsis, shortness of breath EXAM: CT CHEST, ABDOMEN AND PELVIS WITHOUT CONTRAST TECHNIQUE: Multidetector CT imaging of the chest, abdomen and pelvis was performed following the standard protocol without IV contrast. RADIATION DOSE REDUCTION: This exam was performed according to the departmental dose-optimization program which includes automated exposure control, adjustment of the mA and/or kV according to  patient size and/or use of iterative reconstruction technique. COMPARISON:  CT abdomen pelvis, 08/27/2018 FINDINGS: CT CHEST FINDINGS Cardiovascular: Aortic atherosclerosis. Left chest multi lead pacer. Cardiomegaly. Three-vessel coronary artery calcifications. No pericardial effusion. Mediastinum/Nodes: No enlarged mediastinal, hilar, or axillary lymph nodes. Thyroid gland, trachea, and esophagus demonstrate no significant findings. Lungs/Pleura: Small bilateral pleural effusions and associated atelectasis or consolidation. Diffuse bilateral bronchial wall thickening and interlobular septal thickening. Musculoskeletal: No chest wall abnormality. No acute osseous findings. CT ABDOMEN PELVIS FINDINGS Hepatobiliary: No solid liver abnormality is seen. Tiny gallstones or calcified sludge in the gallbladder fundus (series 3, image 66). No gallbladder wall thickening, or biliary dilatation. Pancreas: Unremarkable. No pancreatic ductal dilatation or surrounding inflammatory  changes. Spleen: Normal in size without significant abnormality. Adrenals/Urinary Tract: Adrenal glands are unremarkable. Multiple simple, benign bilateral renal cortical cysts, including a hemorrhagic or proteinaceous cyst of the superior pole of the right kidney. Bilateral perinephric fat stranding. Urinary tract calculi or hydronephrosis. Bladder is unremarkable. Stomach/Bowel: Stomach is within normal limits. Appendix appears normal. No evidence of bowel wall thickening, distention, or inflammatory changes. Sigmoid diverticulosis. Vascular/Lymphatic: Aortic atherosclerosis. No enlarged abdominal or pelvic lymph nodes. Reproductive: No mass or other abnormality. Other: No abdominal wall hernia or abnormality. No ascites. Musculoskeletal: No acute osseous findings. IMPRESSION: 1. Bilateral perinephric fat stranding, suggesting pyelonephritis. No urinary tract calculi or hydronephrosis. Correlate with urinalysis. 2. Small bilateral pleural effusions,  diffuse bilateral bronchial wall thickening, and interlobular septal thickening, consistent with pulmonary edema. 3. Cardiomegaly and coronary artery disease. 4. Tiny gallstones or calcified sludge in the gallbladder fundus. No evidence of acute cholecystitis. 5. Sigmoid diverticulosis without evidence of acute diverticulitis. Aortic Atherosclerosis (ICD10-I70.0). Electronically Signed   By: Delanna Ahmadi M.D.   On: 01/24/2022 12:53    Assessment and Plan:   MSSA bacteremia PPM Indication was asystole, marked pauses up to 25 seconds No pacing on telemetry MDT dual chamber PPM implanted 01/05/22 has 5076 leads in A/V Will need to be removed DIC though is a concern from a procedural standpoint.  Dr. Quentin Ore has seen the patient and discussed with the family rational for PPM removal and reason to hold off doing it acutely today. Will follow clinically and look to perhaps MONDAY with Dr. Lovena Le, I have tenetively placed on Dr. Tanna Furry schedule.  Will arrange TEE prior   Risk Assessment/Risk Scores:    For questions or updates, please contact [place] Please consult [email or web address] for contact info under    Signed, Baldwin [place], PA-C  01/26/2022 10:35 AM

## 2022-01-26 NOTE — Progress Notes (Signed)
[facility] Progress Note Patient Name: Shelby Walker DOB: 07-Oct-1933 MRN: [id number]   Date of Service  01/26/2022  HPI/Events of Note  RN reporting pt w/urinary retention again.  In/out cath @ 0000 for 550 ml.  Bladder scan = 500 ml now.  Pt c/o urge to void but too weak to get up to use commode. Also, c/o nausea.  Has zofran ordered but RN states it has not worked for pt.  States she rec'd reglan yesterday & that helped.  eICU Interventions  In and out cath ordered Reglan 5 mg IV x 1 ordered     Intervention Category Intermediate Interventions: Other:  Judd Lien 01/26/2022, 4:46 AM

## 2022-01-26 NOTE — Progress Notes (Signed)
[facility]  Date of Admission:  01/24/2022     Lines:  11/22-c right ij cvc   11/03-c new pacer   Abx: 11/22-c cefazolin 11/23-c prophy vanco PO                                                          Assessment: Mssa bacteremia Severe sepsis with septic shock S/p permanent pacemaker placement Assymptomatic bacteriuria Hx recurrent cdiff; previously severe presentation Troponinemia Aki Elevated lft Elevated inr -- dic r/o'ed Dm2 ckd3   88 yo female with dm2 hx right tma, ckd3, sinus arrest s/p pacer 11/03 admitted for 1 day chill, n/v/diarrhea, chest-abd pain, found to have mssa septic shock, briefly requiring pressor now off on HD#1   11/22 bcx mssa 11/22 ucx <50k ecoli/pseudomonas. I do not think she has GU infection, and this is all mssa sepsis 11/23 stool cdiff testing negative toxin/antigen   11/22 EKG sinus rhythm without pacer activity 11/22 Ct chest abd pelv showed sign of pulm edema as well and bilateral perinephric stranding 11/23 tte no obvious new eccentric regurg or tell tale valve changes for endocarditis     Very concerned for pacer infection/involvement with mssa bacteremia. Will need EP input     I am not sure what to make of her abd pain/n/v/diarrhea yet. The diarrhea had resolved on HD#1. Her abd seems distended and an ngt was placed 11/23 but kub do not suggest illeus or obstruction. As the cdiff toxin/antigen is negative, I will observe her a bit more before determining if I would pull the trigger to retest cdiff or treat empirically for it. I would hold on cdiff prophylaxis at this time for this reason   ------------- 11/24 assessment Wbc down Aki improving 1 diarrhea episode last night and some left sided abd pain Also complains of and exam c/w lower thoracic upper lumbar midline tenderness  Cardiology have seen and plans potential pacer removal - hope she can get periop tee during this  Reviewed ct  abd/pelv and only sigmoid diverticulosis found without diverticulitis. Unclear what to make of the abd pain yet and the improving diarrhea yet. Doesn't appear cdiff (again stool antigen/toxin negative for cdiff)    Plan: Continue cefazolin F/u repeat bcx result Await pacer removal -- hopefully can do periop TEE I have low suspicion cdiff is active Mri thoracic-lumbar spine next week; will give a few days for it to declare itself prior to mri spine Discussed with primary team   Principal Problem:   Severe sepsis with septic shock ([place]) Active Problems:   AKI (acute kidney injury) ([place])   MSSA bacteremia   Presence of cardiac pacemaker   Allergies  Allergen Reactions   Cholestyramine     Possible- Makes throat burn. Patient said she can't take it    Compazine [Prochlorperazine Edisylate] Swelling    REACTION: " tongue swell and unable to swallow"   Sulfonamide Derivatives Other (See Comments)    REACTION: " broke out with fine itching bumps"   Hydrocodone Other (See Comments)   Infliximab Other (See Comments)    Other reaction(s): rash   Leflunomide     Other reaction(s): diarrhea   Prochlorperazine Other (See Comments)    Other reaction(s): Unknown   Rosuvastatin  Other reaction(s): muscle aches   Sulfa Antibiotics Other (See Comments)    Other reaction(s): tongue swelling    Scheduled Meds:  Chlorhexidine Gluconate Cloth  6 each Topical Daily   hydrocortisone sod succinate (SOLU-CORTEF) inj  50 mg Intravenous Q12H   insulin aspart  0-9 Units Subcutaneous Q4H   lactose free nutrition  237 mL Oral BID BM   lidocaine  1 patch Transdermal Q24H   mouth rinse  15 mL Mouth Rinse 4 times per day   pantoprazole  40 mg Oral Daily   Continuous Infusions:  sodium chloride Stopped (01/26/22 0212)    ceFAZolin (ANCEF) IV 2 g (01/26/22 0902)   PRN Meds:.acetaminophen, docusate sodium, fentaNYL (SUBLIMAZE) injection, ondansetron (ZOFRAN) IV, mouth rinse, polyethylene  glycol   SUBJECTIVE: Aki improving Wbc improving Still abd pain; 1 diarrhea last night Afebrile Ep evaluated and will remove pacer Repeat bcx ngtd   Review of Systems: ROS All other ROS was negative, except mentioned above     OBJECTIVE: Vitals:   01/26/22 0630 01/26/22 0700 01/26/22 0800 01/26/22 0853  BP: (!) 140/63 119/66 (!) 110/53   Pulse: 82 78 74   Resp: '14 12 12   '$ Temp:    98 F (36.7 C)  TempSrc:    Oral  SpO2: 95% 95% 97%   Weight:      Height:       Body mass index is 22.33 kg/m.  Physical Exam General/constitutional: mild distres complaining of back and abd pain; restless HEENT: Normocephalic, PER, Conj Clear, EOMI, Oropharynx clear Neck supple CV: rrr no mrg Lungs: clear to auscultation, normal respiratory effort Abd: Soft, Nontender Ext: no edema Skin: No Rash - pacer site no tenderness/fluctuance Neuro: nonfocal MSK: lower thoracic and upper lumbar midline tenderness   Lab Results Lab Results  Component Value Date   WBC 16.0 (H) 01/26/2022   HGB 8.0 (L) 01/26/2022   HCT 24.3 (L) 01/26/2022   MCV 90.3 01/26/2022   PLT 108 (L) 01/26/2022    Lab Results  Component Value Date   CREATININE 2.26 (H) 01/26/2022   BUN 54 (H) 01/26/2022   NA 142 01/26/2022   K 3.7 01/26/2022   CL 103 01/26/2022   CO2 21 (L) 01/26/2022    Lab Results  Component Value Date   ALT 155 (H) 01/25/2022   AST 250 (H) 01/25/2022   ALKPHOS 61 01/25/2022   BILITOT 0.5 01/25/2022      Microbiology: Recent Results (from the past 240 hour(s))  Resp Panel by RT-PCR (Flu A&B, Covid) Anterior Nasal Swab     Status: None   Collection Time: 01/24/22  9:23 AM   Specimen: Anterior Nasal Swab  Result Value Ref Range Status   SARS Coronavirus 2 by RT PCR NEGATIVE NEGATIVE Final    Comment: (NOTE) SARS-CoV-2 target nucleic acids are NOT DETECTED.  The SARS-CoV-2 RNA is generally detectable in upper respiratory specimens during the acute phase of infection. The  lowest concentration of SARS-CoV-2 viral copies this assay can detect is 138 copies/mL. A negative result does not preclude SARS-Cov-2 infection and should not be used as the sole basis for treatment or other patient management decisions. A negative result may occur with  improper specimen collection/handling, submission of specimen other than nasopharyngeal swab, presence of viral mutation(s) within the areas targeted by this assay, and inadequate number of viral copies(<138 copies/mL). A negative result must be combined with clinical observations, patient history, and epidemiological information. The expected result is Negative.  Fact  Sheet for Patients:  EntrepreneurPulse.com.au  Fact Sheet for Healthcare Providers:  IncredibleEmployment.be  This test is no t yet approved or cleared by the [place] FDA and  has been authorized for detection and/or diagnosis of SARS-CoV-2 by FDA under an Emergency Use Authorization (EUA). This EUA will remain  in effect (meaning this test can be used) for the duration of the COVID-19 declaration under Section 564(b)(1) of the Act, 21 U.S.C.section 360bbb-3(b)(1), unless the authorization is terminated  or revoked sooner.       Influenza A by PCR NEGATIVE NEGATIVE Final   Influenza B by PCR NEGATIVE NEGATIVE Final    Comment: (NOTE) The Xpert Xpress SARS-CoV-2/FLU/RSV plus assay is intended as an aid in the diagnosis of influenza from Nasopharyngeal swab specimens and should not be used as a sole basis for treatment. Nasal washings and aspirates are unacceptable for Xpert Xpress SARS-CoV-2/FLU/RSV testing.  Fact Sheet for Patients: EntrepreneurPulse.com.au  Fact Sheet for Healthcare Providers: IncredibleEmployment.be  This test is not yet approved or cleared by the [place] FDA and has been authorized for detection and/or diagnosis of SARS-CoV-2 by FDA under  an Emergency Use Authorization (EUA). This EUA will remain in effect (meaning this test can be used) for the duration of the COVID-19 declaration under Section 564(b)(1) of the Act, 21 U.S.C. section 360bbb-3(b)(1), unless the authorization is terminated or revoked.  Performed at [facility], [place] [address], [place], [place] [address]   Blood Culture (routine x 2)     Status: Abnormal   Collection Time: 01/24/22  9:23 AM   Specimen: BLOOD  Result Value Ref Range Status   Specimen Description BLOOD SITE NOT SPECIFIED  Final   Special Requests   Final    BOTTLES DRAWN AEROBIC AND ANAEROBIC Blood Culture adequate volume   Culture  Setup Time   Final    GRAM POSITIVE COCCI IN PAIRS IN CLUSTERS IN BOTH AEROBIC AND ANAEROBIC BOTTLES CRITICAL RESULT CALLED TO, READ BACK BY AND VERIFIED WITH: T RUDISILL,PHARMD'@0004'$  01/25/22 [place] Performed at [facility], [place] [address], [place], [place] [address]    Culture STAPHYLOCOCCUS AUREUS (A)  Final   Report Status 01/26/2022 FINAL  Final   Organism ID, Bacteria STAPHYLOCOCCUS AUREUS  Final      Susceptibility   Staphylococcus aureus - MIC*    CIPROFLOXACIN <=0.5 SENSITIVE Sensitive     ERYTHROMYCIN <=0.25 SENSITIVE Sensitive     GENTAMICIN <=0.5 SENSITIVE Sensitive     OXACILLIN 0.5 SENSITIVE Sensitive     TETRACYCLINE <=1 SENSITIVE Sensitive     VANCOMYCIN <=0.5 SENSITIVE Sensitive     TRIMETH/SULFA <=10 SENSITIVE Sensitive     CLINDAMYCIN <=0.25 SENSITIVE Sensitive     RIFAMPIN <=0.5 SENSITIVE Sensitive     Inducible Clindamycin NEGATIVE Sensitive     * STAPHYLOCOCCUS AUREUS  Urine Culture     Status: Abnormal   Collection Time: 01/24/22  9:23 AM   Specimen: In/Out Cath Urine  Result Value Ref Range Status   Specimen Description IN/OUT CATH URINE  Final   Special Requests   Final    NONE Performed at [facility], [place] [address] [place], [place] [address]    Culture (A)  Final    40,000 COLONIES/mL ESCHERICHIA  COLI 40,000 COLONIES/mL PSEUDOMONAS AERUGINOSA    Report Status 01/26/2022 FINAL  Final   Organism ID, Bacteria ESCHERICHIA COLI (A)  Final   Organism ID, Bacteria PSEUDOMONAS AERUGINOSA (A)  Final  Susceptibility   Escherichia coli - MIC*    AMPICILLIN 16 INTERMEDIATE Intermediate     CEFAZOLIN <=4 SENSITIVE Sensitive     CEFEPIME <=0.12 SENSITIVE Sensitive     CEFTRIAXONE <=0.25 SENSITIVE Sensitive     CIPROFLOXACIN <=0.25 SENSITIVE Sensitive     GENTAMICIN <=1 SENSITIVE Sensitive     IMIPENEM <=0.25 SENSITIVE Sensitive     NITROFURANTOIN 32 SENSITIVE Sensitive     TRIMETH/SULFA <=20 SENSITIVE Sensitive     AMPICILLIN/SULBACTAM 4 SENSITIVE Sensitive     PIP/TAZO <=4 SENSITIVE Sensitive     * 40,000 COLONIES/mL ESCHERICHIA COLI   Pseudomonas aeruginosa - MIC*    CEFTAZIDIME 2 SENSITIVE Sensitive     CIPROFLOXACIN 0.5 SENSITIVE Sensitive     GENTAMICIN <=1 SENSITIVE Sensitive     IMIPENEM 2 SENSITIVE Sensitive     PIP/TAZO 8 SENSITIVE Sensitive     CEFEPIME 4 SENSITIVE Sensitive     * 40,000 COLONIES/mL PSEUDOMONAS AERUGINOSA  Blood Culture ID Panel (Reflexed)     Status: Abnormal   Collection Time: 01/24/22  9:23 AM  Result Value Ref Range Status   Enterococcus faecalis NOT DETECTED NOT DETECTED Final   Enterococcus Faecium NOT DETECTED NOT DETECTED Final   Listeria monocytogenes NOT DETECTED NOT DETECTED Final   Staphylococcus species DETECTED (A) NOT DETECTED Final    Comment: CRITICAL RESULT CALLED TO, READ BACK BY AND VERIFIED WITH: T RUDISILL,PHARMD'@0004'$  01/25/22 [place]    Staphylococcus aureus (BCID) DETECTED (A) NOT DETECTED Final    Comment: CRITICAL RESULT CALLED TO, READ BACK BY AND VERIFIED WITH: T RUDISILL,PHARMD'@0004'$  01/25/22 [place]    Staphylococcus epidermidis NOT DETECTED NOT DETECTED Final   Staphylococcus lugdunensis NOT DETECTED NOT DETECTED Final   Streptococcus species NOT DETECTED NOT DETECTED Final   Streptococcus agalactiae NOT DETECTED NOT DETECTED  Final   Streptococcus pneumoniae NOT DETECTED NOT DETECTED Final   Streptococcus pyogenes NOT DETECTED NOT DETECTED Final   A.calcoaceticus-baumannii NOT DETECTED NOT DETECTED Final   Bacteroides fragilis NOT DETECTED NOT DETECTED Final   Enterobacterales NOT DETECTED NOT DETECTED Final   Enterobacter cloacae complex NOT DETECTED NOT DETECTED Final   Escherichia coli NOT DETECTED NOT DETECTED Final   Klebsiella aerogenes NOT DETECTED NOT DETECTED Final   Klebsiella oxytoca NOT DETECTED NOT DETECTED Final   Klebsiella pneumoniae NOT DETECTED NOT DETECTED Final   Proteus species NOT DETECTED NOT DETECTED Final   Salmonella species NOT DETECTED NOT DETECTED Final   Serratia marcescens NOT DETECTED NOT DETECTED Final   Haemophilus influenzae NOT DETECTED NOT DETECTED Final   Neisseria meningitidis NOT DETECTED NOT DETECTED Final   Pseudomonas aeruginosa NOT DETECTED NOT DETECTED Final   Stenotrophomonas maltophilia NOT DETECTED NOT DETECTED Final   Candida albicans NOT DETECTED NOT DETECTED Final   Candida auris NOT DETECTED NOT DETECTED Final   Candida glabrata NOT DETECTED NOT DETECTED Final   Candida krusei NOT DETECTED NOT DETECTED Final   Candida parapsilosis NOT DETECTED NOT DETECTED Final   Candida tropicalis NOT DETECTED NOT DETECTED Final   Cryptococcus neoformans/gattii NOT DETECTED NOT DETECTED Final   Meth resistant mecA/C and MREJ NOT DETECTED NOT DETECTED Final    Comment: Performed at [facility], [address], [place], [place] [address]  Blood Culture (routine x 2)     Status: Abnormal   Collection Time: 01/24/22  9:28 AM   Specimen: BLOOD  Result Value Ref Range Status   Specimen Description BLOOD SITE NOT SPECIFIED  Final   Special Requests  Final    BOTTLES DRAWN AEROBIC AND ANAEROBIC Blood Culture results may not be optimal due to an excessive volume of blood received in culture bottles   Culture  Setup Time   Final    GRAM POSITIVE COCCI IN PAIRS IN BOTH  AEROBIC AND ANAEROBIC BOTTLES CRITICAL VALUE NOTED.  VALUE IS CONSISTENT WITH PREVIOUSLY REPORTED AND CALLED VALUE.    Culture (A)  Final    STAPHYLOCOCCUS AUREUS SUSCEPTIBILITIES PERFORMED ON PREVIOUS CULTURE WITHIN THE LAST 5 DAYS. Performed at [facility], [place] [address], [place], [place] [address]    Report Status 01/26/2022 FINAL  Final  Gastrointestinal Panel by PCR , Stool     Status: None   Collection Time: 01/24/22 10:36 AM   Specimen: Stool  Result Value Ref Range Status   Campylobacter species NOT DETECTED NOT DETECTED Final   Plesimonas shigelloides NOT DETECTED NOT DETECTED Final   Salmonella species NOT DETECTED NOT DETECTED Final   Yersinia enterocolitica NOT DETECTED NOT DETECTED Final   Vibrio species NOT DETECTED NOT DETECTED Final   Vibrio cholerae NOT DETECTED NOT DETECTED Final   Enteroaggregative E coli (EAEC) NOT DETECTED NOT DETECTED Final   Enteropathogenic E coli (EPEC) NOT DETECTED NOT DETECTED Final   Enterotoxigenic E coli (ETEC) NOT DETECTED NOT DETECTED Final   Shiga like toxin producing E coli (STEC) NOT DETECTED NOT DETECTED Final   Shigella/Enteroinvasive E coli (EIEC) NOT DETECTED NOT DETECTED Final   Cryptosporidium NOT DETECTED NOT DETECTED Final   Cyclospora cayetanensis NOT DETECTED NOT DETECTED Final   Entamoeba histolytica NOT DETECTED NOT DETECTED Final   Giardia lamblia NOT DETECTED NOT DETECTED Final   Adenovirus F40/41 NOT DETECTED NOT DETECTED Final   Astrovirus NOT DETECTED NOT DETECTED Final   Norovirus GI/GII NOT DETECTED NOT DETECTED Final   Rotavirus A NOT DETECTED NOT DETECTED Final   Sapovirus (I, II, IV, and V) NOT DETECTED NOT DETECTED Final    Comment: Performed at [facility], [place] [place], [place] [address]  MRSA Next Gen by PCR, Nasal     Status: None   Collection Time: 01/24/22 11:00 PM   Specimen: Nasal Mucosa; Nasal Swab  Result Value Ref Range Status   MRSA by PCR Next Gen NOT DETECTED  NOT DETECTED Final    Comment: (NOTE) The GeneXpert MRSA Assay (FDA approved for NASAL specimens only), is one component of a comprehensive MRSA colonization surveillance program. It is not intended to diagnose MRSA infection nor to guide or monitor treatment for MRSA infections. Test performance is not FDA approved in patients less than 67 years old. Performed at [facility], [place] [address], [place], [place] [address]   C Difficile Quick Screen w PCR reflex     Status: None   Collection Time: 01/25/22  5:14 AM   Specimen: STOOL  Result Value Ref Range Status   C Diff antigen NEGATIVE NEGATIVE Final   C Diff toxin NEGATIVE NEGATIVE Final   C Diff interpretation No C. difficile detected.  Final    Comment: Performed at [facility], [place] [address], [place], [place] [address]  Culture, blood (Routine X 2) w Reflex to ID Panel     Status: None (Preliminary result)   Collection Time: 01/25/22  2:56 PM   Specimen: BLOOD  Result Value Ref Range Status   Specimen Description BLOOD SITE NOT SPECIFIED  Final   Special Requests   Final    BOTTLES DRAWN AEROBIC  AND ANAEROBIC Blood Culture adequate volume   Culture   Final    NO GROWTH < 24 HOURS Performed at [facility], [place] [address] [place], [place] [address]    Report Status PENDING  Incomplete  Culture, blood (Routine X 2) w Reflex to ID Panel     Status: None (Preliminary result)   Collection Time: 01/25/22  2:56 PM   Specimen: BLOOD  Result Value Ref Range Status   Specimen Description BLOOD SITE NOT SPECIFIED  Final   Special Requests   Final    BOTTLES DRAWN AEROBIC AND ANAEROBIC Blood Culture adequate volume   Culture   Final    NO GROWTH < 24 HOURS Performed at [facility], [place] [address] [place], [place] [address]    Report Status PENDING  Incomplete     Serology:   Imaging: If present, new imagings (plain films, ct scans, and mri) have been personally visualized and interpreted;  radiology reports have been reviewed. Decision making incorporated into the Impression / Recommendations.   Jabier Mutton, [facility] for [facility] [place] [phone] pager    01/26/2022, 12:16 PM

## 2022-01-26 NOTE — TOC Initial Note (Signed)
Transition of Care [facility]) - Initial/Assessment Note    Patient Details  Name: Shelby Walker MRN: [id number] Date of Birth: 05/06/1933  Transition [facility] CM/SW Contact:    Tom-Johnson, Renea Ee, RN Phone Number: 01/26/2022, 4:11 PM  Clinical Narrative:                  CM spoke with patient and daughter, Shelby Walker at bedside about discharge disposition. Admitted for Septic Shock. Cardiology following for possible Pacer infection. On IV abx.  Patient is from home alone. Has two children. Independent with care prior to admission. Children assists as needed. Has a rollator, walker and shower seat at home.  PCP is Reynold Bowen, MD and uses [facility] on [address].  Shelby Walker states patient had uses [facility] for home health and would ike to use it again if recommended.  No TOC needs or recommendations noted at this time. CM will continue to follow as patient progresses towards discharge.     Barriers to Discharge: Continued Medical Work up   Patient Goals and CMS Choice Patient states their goals for this hospitalization and ongoing recovery are:: To return home CMS Medicare.gov Compare Post Acute Care list provided to:: Patient Choice offered to / list presented to : Patient, Adult Children (Daughter, Shelby Walker)  Expected Discharge Plan and Services     Discharge Planning Services: CM Consult   Living arrangements for the past 2 months: Single Family Home                                      Prior Living Arrangements/Services Living arrangements for the past 2 months: Single Family Home Lives with:: Self Patient language and need for interpreter reviewed:: Yes Do you feel safe going back to the place where you live?: Yes      Need for Family Participation in Patient Care: Yes (Comment) Care giver support system in place?: Yes (comment)   Criminal Activity/Legal Involvement Pertinent to Current Situation/Hospitalization: No - Comment as  needed  Activities of Daily Living Home Assistive Devices/Equipment: Walker (specify type) ADL Screening (condition at time of admission) Patient's cognitive ability adequate to safely complete daily activities?: Yes Is the patient deaf or have difficulty hearing?: No Does the patient have difficulty seeing, even when wearing glasses/contacts?: No Does the patient have difficulty concentrating, remembering, or making decisions?: Yes Patient able to express need for assistance with ADLs?: Yes Does the patient have difficulty dressing or bathing?: Yes Independently performs ADLs?: No Communication: Independent Dressing (OT): Dependent Is this a change from baseline?: Change from baseline, expected to last >3 days Grooming: Dependent Is this a change from baseline?: Change from baseline, expected to last >3 days Feeding: Dependent Is this a change from baseline?: Change from baseline, expected to last >3 days Bathing: Dependent Is this a change from baseline?: Change from baseline, expected to last >3 days Toileting: Dependent Is this a change from baseline?: Change from baseline, expected to last >3days In/Out Bed: Dependent Is this a change from baseline?: Change from baseline, expected to last >3 days Walks in Home: Needs assistance Is this a change from baseline?: Change from baseline, expected to last >3 days Does the patient have difficulty walking or climbing stairs?: Yes Weakness of Legs: Both Weakness of Arms/Hands: Both  Permission Sought/Granted Permission sought to share information with : Case Manager, Family Supports Permission granted to share information with :  Yes, Verbal Permission Granted              Emotional Assessment Appearance:: Appears stated age Attitude/Demeanor/Rapport: Engaged, Gracious Affect (typically observed): Accepting, Appropriate, Calm, Hopeful, Pleasant Orientation: : Oriented to Self Alcohol / Substance Use: Not Applicable Psych  Involvement: No (comment)  Admission diagnosis:  AKI (acute kidney injury) ([place]) [N17.9] Sepsis ([place]) [A41.9] Sepsis, due to unspecified organism, unspecified whether acute organ dysfunction present [facility]) [A41.9] Patient Active Problem List   Diagnosis Date Noted   Acute midline low back pain without sciatica 01/26/2022   MSSA bacteremia 01/25/2022   Presence of cardiac pacemaker 01/25/2022   Severe sepsis with septic shock ([place]) 01/24/2022   AKI (acute kidney injury) ([place]) 01/24/2022   Sinus pause 01/04/2022   Acute viral bronchitis 02/06/2021   Acute blood loss anemia    Postoperative pain    S/P transmetatarsal amputation of foot, right ([place]) 01/24/2021   Protein-calorie malnutrition, severe 01/17/2021   Diabetic foot infection ([place]) 01/16/2021   Rheumatoid arthritis ([place]) 01/16/2021   Stage 3b chronic kidney disease (CKD) ([place]) 01/16/2021   DNR (do not resuscitate) 01/16/2021   Decreased anal sphincter tone 12/13/2018   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   Irritable bowel syndrome with diarrhea - post infectious 12/12/2018   Full incontinence of feces and fecal smearing 12/12/2018   Perianal dermatitis 12/12/2018   Uncontrolled type 2 diabetes mellitus with hyperglycemia ([place]) 10/26/2018   Long-term use of immunosuppressant medication-Enbrel 10/26/2018   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 08/27/2018   Thrombocytopenia (HCC)    Essential hypertension    Dyspnea on exertion    Steroid-induced hyperglycemia    Fatigue 08/06/2018   Paroxysmal atrial fibrillation ([place])    Mixed hyperlipidemia 09/29/2016   Stable angina 09/29/2016   PAD (peripheral artery disease) ([place]) 08/09/2016   Preoperative clearance 05/21/2012   Precordial chest pain    Palpitations    Diabetes mellitus (HCC)    GERD (gastroesophageal reflux disease)    Ejection fraction    Carotid artery disease ([place])    Mitral regurgitation 04/21/2009   PSORIASIS 02/09/2009   Rheumatoid arthritis flare  ([place]) 02/09/2009   PCP:  Reynold Bowen, MD Pharmacy:   [facility], [place] [address] Phone: [phone] Fax: [place], [place] [address] [place] [address] [place] [address] Phone: [phone] Fax: [phone]     Social Determinants of Health (SDOH) Interventions    Readmission Risk Interventions     No data to display

## 2022-01-26 NOTE — Progress Notes (Addendum)
PROGRESS NOTE    Shelby Walker  CVE:[id number] DOB: Oct 14, 1933 DOA: 01/24/2022 PCP: Reynold Bowen, MD  88/F with history of recurrent C. difficile colitis, chronic diastolic CHF, mitral regurgitation, CAD, paroxysmal A-fib, diabetes mellitus, psoriasis, rheumatoid arthritis, history of PAD/gangrene and transmetatarsal amputation 11/22, recently hospitalized with prolonged sinus pauses, underwent pacemaker implantation on 01/05/2022 and loop recorder removal -Admitted to the ICU 11/22 with septic shock -Found to have MSSA bacteremia -Treated with fluid resuscitation, antibiotics, infectious disease consulted -Transferred from PCCM to [facility] service 11/24  Subjective: -Some confusion, denies chest pain or dyspnea  Assessment and Plan:  SEPTIC SHOCK MSSA  bacteremia - poa -on CT abd peripnehric stranding likely red herring.  Recent PPM on 11/3 for long sinus pauses, pacemaker site appears unremarkable -continue cefazolin, repeat blood cultures 11/23 negative so far, 2D echo without indication of endocarditis -now off pressors -ID following -Will request EP consult   AKI on CKD2 -baseline creat around 1.2, now 2.6 -in the setting of septic shock, now improving, discontinue dextrose  Elevated troponin History of CAD and carotid artery disease -Medical management was recommended as outpatient in the setting of advanced age -Echo with preserved EF, no wall motion abnormalities -Coronary CTA 10/23 noted moderate (50-69%) mixed plaque in the left main and LAD.Can not rule out severe (>70%) splenosis in OM1 -Will add aspirin in 1 to 2 days, avoid BB w/ recent long sinus pauses and allergic to statin -PCCM discussed with cards yesterday, felt to be demand related in the setting of septic shock, echo with preserved EF, no WMA, no role for anticoagulation with DIC, supportive care recommended, unclear why troponins were checked  Paroxysmal atrial fibrillation -  -Per EP notes few weeks ago no  A-fib seen on ILR, not on anticoagulation  Chronic diastolic CHF -Appears euvolemic, not on diuretics at baseline, echo with preserved EF   Rheumatoid arthritis steroid-dependent - Dr Amil Amen  -she was started on stress dose steroids in the ICU, will decrease dose, per daughter on prednisone '4mg'$  daily at baseline  H/o recurrent Cdiff -now on Abx as above -monitor, add floraster    Chronic anemia - hgb 8-9gm% -At baseline, monitor    Thrombocytopenia DIC -In the setting of septic shock, bacteremia -Antibiotics as noted above   Chronic pain from RA-  -At baseline on gabapentin, tramadol, Tylenol 3  -Resume tramadol, gabapentin at lower dose   Vomiting - 01/24/22  -Noted while in ICU, abdominal exam is benign, no further episodes, monitor clinically   Diabetes mellitus with complications -Sliding scale insulin protocol   DVT prophylaxis: SCDs,  Code Status: DNR, d/w son and daughter at bedside Family Communication: d/w  family at bedside Disposition Plan:   Consultants: ID, EP   Procedures:   Antimicrobials:    Objective: Vitals:   01/26/22 0030 01/26/22 0100 01/26/22 0130 01/26/22 0422  BP:  (!) 111/58 116/61   Pulse: 76 76 76   Resp: '15 13 12   '$ Temp:      TempSrc:      SpO2: 95% 95% 96%   Weight:    53.6 kg  Height:        Intake/Output Summary (Last 24 hours) at 01/26/2022 0550 Last data filed at 01/26/2022 0000 Gross per 24 hour  Intake 2533.54 ml  Output 850 ml  Net 1683.54 ml   Filed Weights   01/24/22 0921 01/25/22 0500 01/26/22 0422  Weight: 51 kg 53.6 kg 53.6 kg    Examination:  General exam: Chronically  ill elderly female laying in bed, somnolent but arousable, oriented to self and partly to place only, mild confusion HEENT: Left IJ central line CVS: S1-S2, regular rhythm Lungs: Decreased breath sounds to bases Abdomen: Soft, nontender, bowel sounds present Extremities: No edema Psychiatry: Flat affect    Data Reviewed:    CBC: Recent Labs  Lab 01/24/22 0935 01/24/22 0936 01/25/22 0514 01/25/22 0934 01/25/22 0936  WBC 18.2*  --  21.3* 20.8*  --   NEUTROABS 11.8*  --   --   --   --   HGB 9.1* 8.5* 8.4* 8.6*  --   HCT 28.2* 25.0* 27.9* 27.1*  --   MCV 94.9  --  96.9 95.4  --   PLT 150  --  131* 123* 409*   Basic Metabolic Panel: Recent Labs  Lab 01/24/22 0935 01/24/22 0936 01/25/22 0514 01/25/22 1021 01/25/22 1449  NA 142 141 137 135 134*  K 3.9 3.8 4.8 4.8 4.5  CL 104  --  105 103 105  CO2 21*  --  20* 18* 20*  GLUCOSE 134*  --  188* 183* 178*  BUN 47*  --  51* 50* 52*  CREATININE 2.21*  --  2.67* 2.53* 2.49*  CALCIUM 8.3*  --  7.3* 7.2* 7.2*  MG  --   --  1.7 1.6*  --   PHOS  --   --   --  5.7*  --    GFR: Estimated Creatinine Clearance: 12.2 mL/min (A) (by C-G formula based on SCr of 2.49 mg/dL (H)). Liver Function Tests: Recent Labs  Lab 01/25/22 1021  AST 250*  ALT 155*  ALKPHOS 61  BILITOT 0.5  PROT 4.6*  ALBUMIN 2.4*   Recent Labs  Lab 01/25/22 1021  LIPASE 23   No results for input(s): "AMMONIA" in the last 168 hours. Coagulation Profile: Recent Labs  Lab 01/24/22 0935 01/25/22 0936  INR 1.2 2.0*   Cardiac Enzymes: No results for input(s): "CKTOTAL", "CKMB", "CKMBINDEX", "TROPONINI" in the last 168 hours. BNP (last 3 results) No results for input(s): "PROBNP" in the last 8760 hours. HbA1C: Recent Labs    01/24/22 1503  HGBA1C 7.3*   CBG: Recent Labs  Lab 01/25/22 1207 01/25/22 1516 01/25/22 1920 01/25/22 2349 01/26/22 0421  GLUCAP 172* 176* 160* 166* 148*   Lipid Profile: No results for input(s): "CHOL", "HDL", "LDLCALC", "TRIG", "CHOLHDL", "LDLDIRECT" in the last 72 hours. Thyroid Function Tests: No results for input(s): "TSH", "T4TOTAL", "FREET4", "T3FREE", "THYROIDAB" in the last 72 hours. Anemia Panel: No results for input(s): "VITAMINB12", "FOLATE", "FERRITIN", "TIBC", "IRON", "RETICCTPCT" in the last 72 hours. Urine analysis:     Component Value Date/Time   COLORURINE YELLOW 01/24/2022 0954   APPEARANCEUR HAZY (A) 01/24/2022 0954   LABSPEC 1.018 01/24/2022 0954   PHURINE 5.0 01/24/2022 0954   GLUCOSEU NEGATIVE 01/24/2022 0954   HGBUR NEGATIVE 01/24/2022 0954   BILIRUBINUR NEGATIVE 01/24/2022 0954   KETONESUR NEGATIVE 01/24/2022 0954   PROTEINUR 30 (A) 01/24/2022 0954   NITRITE NEGATIVE 01/24/2022 0954   LEUKOCYTESUR MODERATE (A) 01/24/2022 0954   Sepsis Labs: '@LABRCNTIP'$ (procalcitonin:4,lacticidven:4)  ) Recent Results (from the past 240 hour(s))  Resp Panel by RT-PCR (Flu A&B, Covid) Anterior Nasal Swab     Status: None   Collection Time: 01/24/22  9:23 AM   Specimen: Anterior Nasal Swab  Result Value Ref Range Status   SARS Coronavirus 2 by RT PCR NEGATIVE NEGATIVE Final    Comment: (NOTE) SARS-CoV-2 target nucleic acids are  NOT DETECTED.  The SARS-CoV-2 RNA is generally detectable in upper respiratory specimens during the acute phase of infection. The lowest concentration of SARS-CoV-2 viral copies this assay can detect is 138 copies/mL. A negative result does not preclude SARS-Cov-2 infection and should not be used as the sole basis for treatment or other patient management decisions. A negative result may occur with  improper specimen collection/handling, submission of specimen other than nasopharyngeal swab, presence of viral mutation(s) within the areas targeted by this assay, and inadequate number of viral copies(<138 copies/mL). A negative result must be combined with clinical observations, patient history, and epidemiological information. The expected result is Negative.  Fact Sheet for Patients:  EntrepreneurPulse.com.au  Fact Sheet for Healthcare Providers:  IncredibleEmployment.be  This test is no t yet approved or cleared by the [place] FDA and  has been authorized for detection and/or diagnosis of SARS-CoV-2 by FDA under an Emergency Use  Authorization (EUA). This EUA will remain  in effect (meaning this test can be used) for the duration of the COVID-19 declaration under Section 564(b)(1) of the Act, 21 U.S.C.section 360bbb-3(b)(1), unless the authorization is terminated  or revoked sooner.       Influenza A by PCR NEGATIVE NEGATIVE Final   Influenza B by PCR NEGATIVE NEGATIVE Final    Comment: (NOTE) The Xpert Xpress SARS-CoV-2/FLU/RSV plus assay is intended as an aid in the diagnosis of influenza from Nasopharyngeal swab specimens and should not be used as a sole basis for treatment. Nasal washings and aspirates are unacceptable for Xpert Xpress SARS-CoV-2/FLU/RSV testing.  Fact Sheet for Patients: EntrepreneurPulse.com.au  Fact Sheet for Healthcare Providers: IncredibleEmployment.be  This test is not yet approved or cleared by the [place] FDA and has been authorized for detection and/or diagnosis of SARS-CoV-2 by FDA under an Emergency Use Authorization (EUA). This EUA will remain in effect (meaning this test can be used) for the duration of the COVID-19 declaration under Section 564(b)(1) of the Act, 21 U.S.C. section 360bbb-3(b)(1), unless the authorization is terminated or revoked.  Performed at [facility], [place] [address] [place], [place] [address]   Blood Culture (routine x 2)     Status: Abnormal (Preliminary result)   Collection Time: 01/24/22  9:23 AM   Specimen: BLOOD  Result Value Ref Range Status   Specimen Description BLOOD SITE NOT SPECIFIED  Final   Special Requests   Final    BOTTLES DRAWN AEROBIC AND ANAEROBIC Blood Culture adequate volume   Culture  Setup Time   Final    GRAM POSITIVE COCCI IN PAIRS IN CLUSTERS IN BOTH AEROBIC AND ANAEROBIC BOTTLES CRITICAL RESULT CALLED TO, READ BACK BY AND VERIFIED WITH: T RUDISILL,PHARMD'@0004'$  01/25/22 [place] Performed at [facility], [place] [address] [place], [place] [address]    Culture  STAPHYLOCOCCUS AUREUS (A)  Final   Report Status PENDING  Incomplete  Urine Culture     Status: Abnormal (Preliminary result)   Collection Time: 01/24/22  9:23 AM   Specimen: In/Out Cath Urine  Result Value Ref Range Status   Specimen Description IN/OUT CATH URINE  Final   Special Requests NONE  Final   Culture (A)  Final    40,000 COLONIES/mL ESCHERICHIA COLI 40,000 COLONIES/mL PSEUDOMONAS AERUGINOSA SUSCEPTIBILITIES TO FOLLOW Performed at [facility], [place] [address] [place], [place] [address]    Report Status PENDING  Incomplete  Blood Culture ID Panel (Reflexed)     Status: Abnormal   Collection Time: 01/24/22  9:23 AM  Result Value Ref Range Status   Enterococcus faecalis NOT DETECTED NOT DETECTED Final   Enterococcus Faecium NOT DETECTED NOT DETECTED Final   Listeria monocytogenes NOT DETECTED NOT DETECTED Final   Staphylococcus species DETECTED (A) NOT DETECTED Final    Comment: CRITICAL RESULT CALLED TO, READ BACK BY AND VERIFIED WITH: T RUDISILL,PHARMD'@0004'$  01/25/22 [place]    Staphylococcus aureus (BCID) DETECTED (A) NOT DETECTED Final    Comment: CRITICAL RESULT CALLED TO, READ BACK BY AND VERIFIED WITH: T RUDISILL,PHARMD'@0004'$  01/25/22 [place]    Staphylococcus epidermidis NOT DETECTED NOT DETECTED Final   Staphylococcus lugdunensis NOT DETECTED NOT DETECTED Final   Streptococcus species NOT DETECTED NOT DETECTED Final   Streptococcus agalactiae NOT DETECTED NOT DETECTED Final   Streptococcus pneumoniae NOT DETECTED NOT DETECTED Final   Streptococcus pyogenes NOT DETECTED NOT DETECTED Final   A.calcoaceticus-baumannii NOT DETECTED NOT DETECTED Final   Bacteroides fragilis NOT DETECTED NOT DETECTED Final   Enterobacterales NOT DETECTED NOT DETECTED Final   Enterobacter cloacae complex NOT DETECTED NOT DETECTED Final   Escherichia coli NOT DETECTED NOT DETECTED Final   Klebsiella aerogenes NOT DETECTED NOT DETECTED Final   Klebsiella oxytoca NOT DETECTED NOT DETECTED  Final   Klebsiella pneumoniae NOT DETECTED NOT DETECTED Final   Proteus species NOT DETECTED NOT DETECTED Final   Salmonella species NOT DETECTED NOT DETECTED Final   Serratia marcescens NOT DETECTED NOT DETECTED Final   Haemophilus influenzae NOT DETECTED NOT DETECTED Final   Neisseria meningitidis NOT DETECTED NOT DETECTED Final   Pseudomonas aeruginosa NOT DETECTED NOT DETECTED Final   Stenotrophomonas maltophilia NOT DETECTED NOT DETECTED Final   Candida albicans NOT DETECTED NOT DETECTED Final   Candida auris NOT DETECTED NOT DETECTED Final   Candida glabrata NOT DETECTED NOT DETECTED Final   Candida krusei NOT DETECTED NOT DETECTED Final   Candida parapsilosis NOT DETECTED NOT DETECTED Final   Candida tropicalis NOT DETECTED NOT DETECTED Final   Cryptococcus neoformans/gattii NOT DETECTED NOT DETECTED Final   Meth resistant mecA/C and MREJ NOT DETECTED NOT DETECTED Final    Comment: Performed at [facility], [address], [place], [place] [address]  Blood Culture (routine x 2)     Status: Abnormal (Preliminary result)   Collection Time: 01/24/22  9:28 AM   Specimen: BLOOD  Result Value Ref Range Status   Specimen Description BLOOD SITE NOT SPECIFIED  Final   Special Requests   Final    BOTTLES DRAWN AEROBIC AND ANAEROBIC Blood Culture results may not be optimal due to an excessive volume of blood received in culture bottles   Culture  Setup Time   Final    GRAM POSITIVE COCCI IN PAIRS IN BOTH AEROBIC AND ANAEROBIC BOTTLES CRITICAL VALUE NOTED.  VALUE IS CONSISTENT WITH PREVIOUSLY REPORTED AND CALLED VALUE. Performed at [facility], [place] [address] [place], [place] [address]    Culture STAPHYLOCOCCUS AUREUS (A)  Final   Report Status PENDING  Incomplete  Gastrointestinal Panel by PCR , Stool     Status: None   Collection Time: 01/24/22 10:36 AM   Specimen: Stool  Result Value Ref Range Status   Campylobacter species NOT DETECTED NOT DETECTED Final    Plesimonas shigelloides NOT DETECTED NOT DETECTED Final   Salmonella species NOT DETECTED NOT DETECTED Final   Yersinia enterocolitica NOT DETECTED NOT DETECTED Final   Vibrio species NOT DETECTED NOT DETECTED Final   Vibrio cholerae NOT DETECTED NOT DETECTED Final  Enteroaggregative E coli (EAEC) NOT DETECTED NOT DETECTED Final   Enteropathogenic E coli (EPEC) NOT DETECTED NOT DETECTED Final   Enterotoxigenic E coli (ETEC) NOT DETECTED NOT DETECTED Final   Shiga like toxin producing E coli (STEC) NOT DETECTED NOT DETECTED Final   Shigella/Enteroinvasive E coli (EIEC) NOT DETECTED NOT DETECTED Final   Cryptosporidium NOT DETECTED NOT DETECTED Final   Cyclospora cayetanensis NOT DETECTED NOT DETECTED Final   Entamoeba histolytica NOT DETECTED NOT DETECTED Final   Giardia lamblia NOT DETECTED NOT DETECTED Final   Adenovirus F40/41 NOT DETECTED NOT DETECTED Final   Astrovirus NOT DETECTED NOT DETECTED Final   Norovirus GI/GII NOT DETECTED NOT DETECTED Final   Rotavirus A NOT DETECTED NOT DETECTED Final   Sapovirus (I, II, IV, and V) NOT DETECTED NOT DETECTED Final    Comment: Performed at [facility], [place] [place], [place] [address]  MRSA Next Gen by PCR, Nasal     Status: None   Collection Time: 01/24/22 11:00 PM   Specimen: Nasal Mucosa; Nasal Swab  Result Value Ref Range Status   MRSA by PCR Next Gen NOT DETECTED NOT DETECTED Final    Comment: (NOTE) The GeneXpert MRSA Assay (FDA approved for NASAL specimens only), is one component of a comprehensive MRSA colonization surveillance program. It is not intended to diagnose MRSA infection nor to guide or monitor treatment for MRSA infections. Test performance is not FDA approved in patients less than 102 years old. Performed at [facility], [place] [address], [place], [place] [address]   C Difficile Quick Screen w PCR reflex     Status: None   Collection Time: 01/25/22  5:14 AM   Specimen: STOOL  Result  Value Ref Range Status   C Diff antigen NEGATIVE NEGATIVE Final   C Diff toxin NEGATIVE NEGATIVE Final   C Diff interpretation No C. difficile detected.  Final    Comment: Performed at [facility], [place] [address] [place], [place] [address]     Radiology Studies: ECHOCARDIOGRAM COMPLETE  Result Date: 01/25/2022    ECHOCARDIOGRAM REPORT   Patient Name:   SAMEERAH NACHTIGAL Date of Exam: 01/25/2022 Medical Rec #:  [id number]   Height:       61.0 in Accession #:    [id number]  Weight:       118.2 lb Date of Birth:  11/14/1933   BSA:          1.510 m Patient Age:    45 years    BP:           127/60 mmHg Patient Gender: F           HR:           78 bpm. Exam Location:  Inpatient Procedure: 2D Echo, Cardiac Doppler and Color Doppler                                 MODIFIED REPORT:     This report was modified by Rudean Haskell MD on 01/25/2022 due to                  Clarifed device lead; discussed with primary.  Indications:     Elevated troponin  History:         Patient has prior history of Echocardiogram examinations, most  recent 01/05/2022. Pacemaker, PAD, Mitral Valve Disease; Risk                  Factors:Dyslipidemia. CKD.  Sonographer:     Clayton Lefort RDCS (AE) Referring Phys:  Brand Males Diagnosing Phys: Rudean Haskell MD IMPRESSIONS  1. Left ventricular ejection fraction, by estimation, is 50 to 55%. The left ventricle has low normal function. The left ventricle has no regional wall motion abnormalities. Left ventricular diastolic parameters are consistent with Grade II diastolic dysfunction (pseudonormalization).  2. Right ventricular systolic function is normal. The right ventricular size is normal. There is normal pulmonary artery systolic pressure. The estimated right ventricular systolic pressure is 35.4 mmHg.  3. Left atrial size was moderately dilated.  4. The mitral valve is degenerative. Mild mitral valve regurgitation. The mean mitral valve gradient is 4.0  mmHg with average heart rate of 77 bpm.  5. The aortic valve is tricuspid. There is mild calcification of the aortic valve. There is mild thickening of the aortic valve. Aortic valve regurgitation is not visualized. Aortic valve sclerosis is present, with no evidence of aortic valve stenosis.  6. The inferior vena cava is dilated in size with <50% respiratory variability, suggesting right atrial pressure of 15 mmHg. Comparison(s): New device lead. EF is less vigorous than prior. Conclusion(s)/Recommendation(s): Discussed with primary. FINDINGS  Left Ventricle: Left ventricular ejection fraction, by estimation, is 50 to 55%. The left ventricle has low normal function. The left ventricle has no regional wall motion abnormalities. The left ventricular internal cavity size was normal in size. There is no left ventricular hypertrophy. Left ventricular diastolic parameters are consistent with Grade II diastolic dysfunction (pseudonormalization). Right Ventricle: The right ventricular size is normal. No increase in right ventricular wall thickness. Right ventricular systolic function is normal. There is normal pulmonary artery systolic pressure. The tricuspid regurgitant velocity is 2.22 m/s, and  with an assumed right atrial pressure of 15 mmHg, the estimated right ventricular systolic pressure is 65.6 mmHg. Left Atrium: Left atrial size was moderately dilated. Right Atrium: Right atrial size was normal in size. Pericardium: Trivial pericardial effusion is present. The pericardial effusion is surrounding the apex. Mitral Valve: The mitral valve is degenerative in appearance. Mild mitral valve regurgitation. MV peak gradient, 8.8 mmHg. The mean mitral valve gradient is 4.0 mmHg with average heart rate of 77 bpm. Tricuspid Valve: The tricuspid valve is grossly normal. Tricuspid valve regurgitation is not demonstrated. No evidence of tricuspid stenosis. Aortic Valve: The aortic valve is tricuspid. There is mild calcification  of the aortic valve. There is mild thickening of the aortic valve. Aortic valve regurgitation is not visualized. Aortic valve sclerosis is present, with no evidence of aortic valve stenosis. Aortic valve mean gradient measures 3.0 mmHg. Aortic valve peak gradient measures 6.0 mmHg. Aortic valve area, by VTI measures 1.47 cm. Pulmonic Valve: The pulmonic valve was not well visualized. Pulmonic valve regurgitation is not visualized. No evidence of pulmonic stenosis. Aorta: The aortic root and ascending aorta are structurally normal, with no evidence of dilitation. Venous: The inferior vena cava is dilated in size with less than 50% respiratory variability, suggesting right atrial pressure of 15 mmHg. IAS/Shunts: No atrial level shunt detected by color flow Doppler. Additional Comments: A device lead is visualized in the right ventricle and right atrium.  LEFT VENTRICLE PLAX 2D LVIDd:         4.60 cm     Diastology LVIDs:         3.90  cm     LV e' medial:    5.33 cm/s LV PW:         1.00 cm     LV E/e' medial:  25.0 LV IVS:        0.90 cm     LV e' lateral:   7.62 cm/s LVOT diam:     1.80 cm     LV E/e' lateral: 17.5 LV SV:         41 LV SV Index:   27 LVOT Area:     2.54 cm  LV Volumes (MOD) LV vol d, MOD A2C: 61.0 ml LV vol d, MOD A4C: 69.7 ml LV vol s, MOD A2C: 32.2 ml LV vol s, MOD A4C: 39.1 ml LV SV MOD A2C:     28.8 ml LV SV MOD A4C:     69.7 ml LV SV MOD BP:      29.3 ml RIGHT VENTRICLE            IVC RV Basal diam:  2.90 cm    IVC diam: 2.50 cm RV S prime:     9.46 cm/s TAPSE (M-mode): 1.3 cm LEFT ATRIUM           Index        RIGHT ATRIUM           Index LA diam:      3.90 cm 2.58 cm/m   RA Area:     14.10 cm LA Vol (A2C): 45.9 ml 30.39 ml/m  RA Volume:   39.50 ml  26.15 ml/m LA Vol (A4C): 60.4 ml 39.99 ml/m  AORTIC VALVE AV Area (Vmax):    1.61 cm AV Area (Vmean):   1.55 cm AV Area (VTI):     1.47 cm AV Vmax:           122.00 cm/s AV Vmean:          84.200 cm/s AV VTI:            0.278 m AV Peak  Grad:      6.0 mmHg AV Mean Grad:      3.0 mmHg LVOT Vmax:         77.20 cm/s LVOT Vmean:        51.200 cm/s LVOT VTI:          0.161 m LVOT/AV VTI ratio: 0.58  AORTA Ao Root diam: 2.70 cm Ao Asc diam:  2.70 cm MITRAL VALVE                TRICUSPID VALVE MV Area (PHT): 2.81 cm     TR Peak grad:   19.7 mmHg MV Area VTI:   1.08 cm     TR Vmax:        222.00 cm/s MV Peak grad:  8.8 mmHg MV Mean grad:  4.0 mmHg     SHUNTS MV Vmax:       1.48 m/s     Systemic VTI:  0.16 m MV Vmean:      88.4 cm/s    Systemic Diam: 1.80 cm MV Decel Time: 270 msec MV E velocity: 133.00 cm/s MV A velocity: 91.20 cm/s MV E/A ratio:  1.46 Rudean Haskell MD Electronically signed by Rudean Haskell MD Signature Date/Time: 01/25/2022/1:46:07 PM    Final (Updated)    DG Abd Portable 1V  Result Date: 01/25/2022 CLINICAL DATA:  Evaluate nasogastric tube placement. EXAM: PORTABLE ABDOMEN - 1 VIEW COMPARISON:  Earlier today FINDINGS: Interval placement of  a nasogastric tube. The tip and side port are both well below the level of the GE junction. The distal portion of the tube appears looped within the body of the stomach. No dilated bowel loops identified. IMPRESSION: Interval placement of nasogastric tube with tip and side port well below the level of the GE junction. Electronically Signed   By: Kerby Moors M.D.   On: 01/25/2022 13:15   DG CHEST PORT 1 VIEW  Result Date: 01/25/2022 CLINICAL DATA:  Provided history: Acute and chronic respiratory failure (acute on chronic). Shortness of breath, concern for aspiration. EXAM: PORTABLE CHEST 1 VIEW COMPARISON:  Prior chest radiographs 01/24/2022 and earlier. FINDINGS: Left chest multilead implantable cardiac device. Left IJ approach central venous catheter with tip terminating at the level of the lower SVC. Mild cardiomegaly, unchanged. Aortic atherosclerosis. Central pulmonary vascular congestion without overt pulmonary edema. Trace bilateral pleural effusions. Minimal  ill-defined opacity at the left lung base has an appearance favoring atelectasis. No definite airspace consolidation. IMPRESSION: 1. Minimal ill-defined opacity at the left lung base has an appearance favoring atelectasis. No definite airspace consolidation. 2. Mild cardiomegaly. 3. Central pulmonary vascular congestion without overt pulmonary edema. 4. Trace bilateral pleural effusions. 5. Aortic Atherosclerosis (ICD10-I70.0). Electronically Signed   By: Kellie Simmering D.O.   On: 01/25/2022 11:53   DG Abd 1 View  Result Date: 01/25/2022 CLINICAL DATA:  88 year old female with vomiting. EXAM: ABDOMEN - 1 VIEW COMPARISON:  Noncontrast CT Chest, Abdomen, and Pelvis yesterday. FINDINGS: Portable AP supine view at 1008 hours. Cardiac pacemaker redemonstrated. Small bilateral pleural effusions remain apparent. Ongoing paucity of bowel gas in the abdomen and pelvis. No significant change from the CT scout view yesterday. No pneumoperitoneum evident on these supine views. Stable visualized osseous structures. IMPRESSION: 1. Paucity of bowel gas as on the CT Abdomen and Pelvis yesterday. No strong imaging evidence of bowel obstruction. 2. Small bilateral pleural effusions. Electronically Signed   By: Genevie Ann M.D.   On: 01/25/2022 10:21   DG CHEST PORT 1 VIEW  Result Date: 01/24/2022 CLINICAL DATA:  Central line placement EXAM: PORTABLE CHEST 1 VIEW COMPARISON:  01/24/2022, 9:32 a.m. FINDINGS: Cardiomegaly. Left chest multi lead pacer. Interval placement of left neck vascular catheter, tip over the superior vena cava. Both lungs are clear. The visualized skeletal structures are unremarkable. IMPRESSION: 1. Interval placement of left neck vascular catheter, tip over the superior vena cava. No pneumothorax. 2. Cardiomegaly. No acute abnormality of the lungs. Electronically Signed   By: Delanna Ahmadi M.D.   On: 01/24/2022 18:55   NM Pulmonary Perfusion  Result Date: 01/24/2022 CLINICAL DATA:  Renal failure,  diabetes mellitus, paroxysmal atrial fibrillation, hypertension, chest pain, question pulmonary embolism EXAM: NUCLEAR MEDICINE PERFUSION LUNG SCAN TECHNIQUE: Perfusion images were obtained in multiple projections after intravenous injection of radiopharmaceutical. Ventilation scans intentionally deferred if perfusion scan and chest x-ray adequate for interpretation during COVID 19 epidemic. RADIOPHARMACEUTICALS:  4.2 mCi Tc-34mMAA IV COMPARISON:  01/24/2022. FINDINGS: Normal perfusion lung scan. No perfusion defects. IMPRESSION: Normal perfusion lung scan. Electronically Signed   By: MLavonia DanaM.D.   On: 01/24/2022 14:49   CT CHEST ABDOMEN PELVIS WO CONTRAST  Result Date: 01/24/2022 CLINICAL DATA:  Sepsis, shortness of breath EXAM: CT CHEST, ABDOMEN AND PELVIS WITHOUT CONTRAST TECHNIQUE: Multidetector CT imaging of the chest, abdomen and pelvis was performed following the standard protocol without IV contrast. RADIATION DOSE REDUCTION: This exam was performed according to the departmental dose-optimization program which includes automated  exposure control, adjustment of the mA and/or kV according to patient size and/or use of iterative reconstruction technique. COMPARISON:  CT abdomen pelvis, 08/27/2018 FINDINGS: CT CHEST FINDINGS Cardiovascular: Aortic atherosclerosis. Left chest multi lead pacer. Cardiomegaly. Three-vessel coronary artery calcifications. No pericardial effusion. Mediastinum/Nodes: No enlarged mediastinal, hilar, or axillary lymph nodes. Thyroid gland, trachea, and esophagus demonstrate no significant findings. Lungs/Pleura: Small bilateral pleural effusions and associated atelectasis or consolidation. Diffuse bilateral bronchial wall thickening and interlobular septal thickening. Musculoskeletal: No chest wall abnormality. No acute osseous findings. CT ABDOMEN PELVIS FINDINGS Hepatobiliary: No solid liver abnormality is seen. Tiny gallstones or calcified sludge in the gallbladder fundus  (series 3, image 66). No gallbladder wall thickening, or biliary dilatation. Pancreas: Unremarkable. No pancreatic ductal dilatation or surrounding inflammatory changes. Spleen: Normal in size without significant abnormality. Adrenals/Urinary Tract: Adrenal glands are unremarkable. Multiple simple, benign bilateral renal cortical cysts, including a hemorrhagic or proteinaceous cyst of the superior pole of the right kidney. Bilateral perinephric fat stranding. Urinary tract calculi or hydronephrosis. Bladder is unremarkable. Stomach/Bowel: Stomach is within normal limits. Appendix appears normal. No evidence of bowel wall thickening, distention, or inflammatory changes. Sigmoid diverticulosis. Vascular/Lymphatic: Aortic atherosclerosis. No enlarged abdominal or pelvic lymph nodes. Reproductive: No mass or other abnormality. Other: No abdominal wall hernia or abnormality. No ascites. Musculoskeletal: No acute osseous findings. IMPRESSION: 1. Bilateral perinephric fat stranding, suggesting pyelonephritis. No urinary tract calculi or hydronephrosis. Correlate with urinalysis. 2. Small bilateral pleural effusions, diffuse bilateral bronchial wall thickening, and interlobular septal thickening, consistent with pulmonary edema. 3. Cardiomegaly and coronary artery disease. 4. Tiny gallstones or calcified sludge in the gallbladder fundus. No evidence of acute cholecystitis. 5. Sigmoid diverticulosis without evidence of acute diverticulitis. Aortic Atherosclerosis (ICD10-I70.0). Electronically Signed   By: Delanna Ahmadi M.D.   On: 01/24/2022 12:53   DG Chest Port 1 View  Result Date: 01/24/2022 CLINICAL DATA:  Chest pain yesterday. Found unresponsive this morning. Now short of breath. EXAM: PORTABLE CHEST 1 VIEW COMPARISON:  Radiographs 01/06/2022 and 01/04/2022.  CT 12/13/2021. FINDINGS: 0932 hours. Left subclavian pacemaker leads appear unchanged, overlapping the right atrium and the right ventricle. The heart size and  mediastinal contours are stable with aortic atherosclerosis. Unchanged mild left basilar atelectasis. No edema, confluent airspace opacity, pleural effusion or pneumothorax. An old right-sided rib fracture is noted. No acute osseous findings are evident. Telemetry leads overlie the chest. IMPRESSION: No evidence of acute cardiopulmonary process. Stable mild left basilar atelectasis. Electronically Signed   By: Richardean Sale M.D.   On: 01/24/2022 09:49     Scheduled Meds:  Chlorhexidine Gluconate Cloth  6 each Topical Daily   hydrocortisone sod succinate (SOLU-CORTEF) inj  100 mg Intravenous Q8H   insulin aspart  0-9 Units Subcutaneous Q4H   lidocaine  1 patch Transdermal Q24H   mouth rinse  15 mL Mouth Rinse 4 times per day   pantoprazole  40 mg Oral Daily   Continuous Infusions:  sodium chloride 10 mL/hr at 01/26/22 0000    ceFAZolin (ANCEF) IV Stopped (01/25/22 2209)   dextrose 5% lactated ringers 50 mL/hr at 01/26/22 0224   norepinephrine (LEVOPHED) Adult infusion Stopped (01/25/22 1901)     LOS: 2 days    Time spent: 84mn    PDomenic Polite MD [facility]   01/26/2022, 5:50 AM

## 2022-01-26 NOTE — Progress Notes (Addendum)
Respond to bed alarm, patient sitting upright in bed with central line removed from neck as well as NG tube. Applied pressure dressing, ground team at bedside Georgann Housekeeper). Patient hemodynamically stable, no neurological change. She states she woke confused and was trying to sit up out of bed. AM labs drawn by phlebotomy.

## 2022-01-26 NOTE — Progress Notes (Signed)
Pt refusing to have NG tube replaced

## 2022-01-26 NOTE — Progress Notes (Signed)
Initial Nutrition Assessment  DOCUMENTATION CODES:   Not applicable  INTERVENTION:   Boost Plus po BID, each supplement provides 360 kcal and 14 grams of protein Renal Multivitamin w/ minerals daily  NUTRITION DIAGNOSIS:   Inadequate oral intake related to decreased appetite as evidenced by per patient/family report.  GOAL:   Patient will meet greater than or equal to 90% of their needs  MONITOR:   PO intake, Supplement acceptance, Labs, I & O's  REASON FOR ASSESSMENT:   Malnutrition Screening Tool    ASSESSMENT:   88 y.o. female presented to the ED after difficult to arouse and fever. PMH includes GERD, T2DM, recurrent C. Diff, CKD IIIb, and PAD. Pt admitted with septic shock due to pyelonephritis and AKI on CKD.   11/23 - NG tube placed 11/24 - NG tube removed ; diet advanced to SOFT  Pt laying in bed, will answer most of RD questions. Family at bedside, provides additional nutrition education. Pt reports that he stomach kinda hurts. Reports that her appetite is ok and that she eats, just does not eat a lot. Reports that she "eats like a bird." Shares that she does drink a nutritional supplement occasionally when she needs to. Pt agreeable to chocolate Boost while at the hospital. Reviewed menu with family. Pt does not have dentures here, family to assist in meal ordering to order foods that pt can chew.  Family endorses ongoing weight loss. Per EMR, pt with no significant weight loss over the past year.   Medications reviewed and include: Solu-cortef, NovoLog SSI, Protonix, IV antibiotics, LR  Labs reviewed: BUN 54, Creatinine 2.26, Phosphorus 5.4, Magnesium 2.9, Hgb A1c 7.3%, 24 hr CBGs 148-176  NUTRITION - FOCUSED PHYSICAL EXAM:  Flowsheet Row Most Recent Value  Orbital Region No depletion  Upper Arm Region Mild depletion  Thoracic and Lumbar Region No depletion  Buccal Region No depletion  Temple Region No depletion  Clavicle Bone Region Mild depletion   Clavicle and Acromion Bone Region Mild depletion  Scapular Bone Region Mild depletion  Dorsal Hand Mild depletion  Patellar Region Mild depletion  Anterior Thigh Region Mild depletion  Posterior Calf Region Mild depletion  Edema (RD Assessment) None  Hair Reviewed  Eyes Unable to assess  Mouth Unable to assess  Skin Reviewed  Nails Reviewed   Diet Order:   Diet Order             DIET SOFT Room service appropriate? Yes; Fluid consistency: Thin  Diet effective now                   EDUCATION NEEDS:   No education needs have been identified at this time  Skin:  Skin Assessment: Reviewed RN Assessment  Last BM:  11/23  Height:   Ht Readings from Last 1 Encounters:  01/24/22 '5\' 1"'$  (1.549 m)    Weight:   Wt Readings from Last 1 Encounters:  01/26/22 53.6 kg    Ideal Body Weight:  47.7 kg  BMI:  Body mass index is 22.33 kg/m.  Estimated Nutritional Needs:   Kcal:  1600-1800  Protein:  80-95 grams  Fluid:  >/= 1.6 L    Hermina Barters RD, LDN Clinical Dietitian See [facility] for contact information.

## 2022-01-27 DIAGNOSIS — R7881 Bacteremia: Secondary | ICD-10-CM | POA: Diagnosis not present

## 2022-01-27 DIAGNOSIS — M545 Low back pain, unspecified: Secondary | ICD-10-CM | POA: Diagnosis not present

## 2022-01-27 DIAGNOSIS — Z950 Presence of cardiac pacemaker: Secondary | ICD-10-CM | POA: Diagnosis not present

## 2022-01-27 DIAGNOSIS — A419 Sepsis, unspecified organism: Secondary | ICD-10-CM | POA: Diagnosis not present

## 2022-01-27 LAB — CBC
HCT: 23.7 % — ABNORMAL LOW (ref 36.0–46.0)
Hemoglobin: 7.8 g/dL — ABNORMAL LOW (ref 12.0–15.0)
MCH: 29.8 pg (ref 26.0–34.0)
MCHC: 32.9 g/dL (ref 30.0–36.0)
MCV: 90.5 fL (ref 80.0–100.0)
Platelets: 91 10*3/uL — ABNORMAL LOW (ref 150–400)
RBC: 2.62 MIL/uL — ABNORMAL LOW (ref 3.87–5.11)
RDW: 15.7 % — ABNORMAL HIGH (ref 11.5–15.5)
WBC: 14.5 10*3/uL — ABNORMAL HIGH (ref 4.0–10.5)
nRBC: 0 % (ref 0.0–0.2)

## 2022-01-27 LAB — COMPREHENSIVE METABOLIC PANEL
ALT: 116 U/L — ABNORMAL HIGH (ref 0–44)
AST: 212 U/L — ABNORMAL HIGH (ref 15–41)
Albumin: 2.4 g/dL — ABNORMAL LOW (ref 3.5–5.0)
Alkaline Phosphatase: 79 U/L (ref 38–126)
Anion gap: 10 (ref 5–15)
BUN: 52 mg/dL — ABNORMAL HIGH (ref 8–23)
CO2: 21 mmol/L — ABNORMAL LOW (ref 22–32)
Calcium: 7.2 mg/dL — ABNORMAL LOW (ref 8.9–10.3)
Chloride: 104 mmol/L (ref 98–111)
Creatinine, Ser: 1.87 mg/dL — ABNORMAL HIGH (ref 0.44–1.00)
GFR, Estimated: 26 mL/min — ABNORMAL LOW (ref >60)
Glucose, Bld: 124 mg/dL — ABNORMAL HIGH (ref 70–99)
Potassium: 3.2 mmol/L — ABNORMAL LOW (ref 3.5–5.1)
Sodium: 135 mmol/L (ref 135–145)
Total Bilirubin: 0.7 mg/dL (ref 0.3–1.2)
Total Protein: 5.3 g/dL — ABNORMAL LOW (ref 6.5–8.1)

## 2022-01-27 LAB — GLUCOSE, CAPILLARY
Glucose-Capillary: 142 mg/dL — ABNORMAL HIGH (ref 70–99)
Glucose-Capillary: 102 mg/dL — ABNORMAL HIGH (ref 70–99)
Glucose-Capillary: 161 mg/dL — ABNORMAL HIGH (ref 70–99)
Glucose-Capillary: 169 mg/dL — ABNORMAL HIGH (ref 70–99)
Glucose-Capillary: 210 mg/dL — ABNORMAL HIGH (ref 70–99)

## 2022-01-27 MED ORDER — HYDRALAZINE HCL 20 MG/ML IJ SOLN
10.0000 mg | Freq: Four times a day (QID) | INTRAMUSCULAR | Status: DC | PRN
  Administered 2022-01-29: 10 mg via INTRAVENOUS
  Filled 2022-01-27: qty 1

## 2022-01-27 MED ORDER — POTASSIUM CHLORIDE 20 MEQ PO PACK
40.0000 meq | PACK | Freq: Two times a day (BID) | ORAL | Status: AC
  Administered 2022-01-27 (×2): 40 meq via ORAL
  Filled 2022-01-27 (×2): qty 2

## 2022-01-27 MED ORDER — PHENAZOPYRIDINE HCL 200 MG PO TABS
Freq: Once | ORAL | Status: DC
  Filled 2022-01-27: qty 15

## 2022-01-27 MED ORDER — HYDROCORTISONE SOD SUC (PF) 100 MG IJ SOLR
25.0000 mg | Freq: Two times a day (BID) | INTRAMUSCULAR | Status: DC
  Administered 2022-01-27 – 2022-01-28 (×2): 25 mg via INTRAVENOUS
  Filled 2022-01-27 (×2): qty 0.5

## 2022-01-27 MED ORDER — PHENAZOPYRIDINE HCL 100 MG PO TABS
100.0000 mg | ORAL_TABLET | Freq: Three times a day (TID) | ORAL | Status: DC
  Administered 2022-01-27: 100 mg via ORAL
  Filled 2022-01-27 (×2): qty 1

## 2022-01-27 MED ORDER — FUROSEMIDE 10 MG/ML IJ SOLN
40.0000 mg | Freq: Once | INTRAMUSCULAR | Status: AC
  Administered 2022-01-27: 40 mg via INTRAVENOUS
  Filled 2022-01-27: qty 4

## 2022-01-27 NOTE — Evaluation (Signed)
Clinical/Bedside Swallow Evaluation Patient Details  Name: Shelby Walker MRN: [id number] Date of Birth: Jun 14, 1933  Today's Date: 01/27/2022 Time: SLP Start Time (ACUTE ONLY): 1448 SLP Stop Time (ACUTE ONLY): 1459 SLP Time Calculation (min) (ACUTE ONLY): 11 min  Past Medical History:  Past Medical History:  Diagnosis Date   Bruit    Carotid Doppler showed no significant abnormality     9per patient)   C. difficile colitis 07/2018   with severe sepsis   Chest pain, unspecified    Nuclear, May, 2008, no scar or ischemia   Diabetes mellitus    Diverticulosis    Dyslipidemia    Ejection fraction    EF 55-60%, echo, February, 2011 // Echocardiogram 8/21: EF 60-65, no RWMA, Gr 1 DD, GLS -14%, normal RVSF, mild LAE, trivial MR, mild MS (mean 4 mmHg), RVSP 23.4    GERD (gastroesophageal reflux disease)    Mitral regurgitation April 21, 2009   mild,  echo, February, 2011   Osteoporosis    Palpitations    possible very brief atrial fibrillation on monitor and possible reentrant tachycardia   Psoriasis    Rheumatoid arthritis ([place])    Past Surgical History:  Past Surgical History:  Procedure Laterality Date   ABDOMINAL AORTOGRAM W/LOWER EXTREMITY Right 10/19/2020   Procedure: ABDOMINAL AORTOGRAM W/LOWER EXTREMITY;  Surgeon: Wellington Hampshire, MD;  Location: [place] [facility];  Service: Cardiovascular;  Laterality: Right;   FRACTURE SURGERY     LOOP RECORDER REMOVAL N/A 01/05/2022   Procedure: LOOP RECORDER REMOVAL;  Surgeon: Evans Lance, MD;  Location: [place] [facility];  Service: Cardiovascular;  Laterality: N/A;   PACEMAKER IMPLANT N/A 01/05/2022   Procedure: PACEMAKER IMPLANT;  Surgeon: Evans Lance, MD;  Location: [place] [facility];  Service: Cardiovascular;  Laterality: N/A;   TRANSMETATARSAL AMPUTATION Right 01/18/2021   Procedure: TRANSMETATARSAL AMPUTATION;  Surgeon: Wylene Simmer, MD;  Location: [place];  Service: Orthopedics;  Laterality: Right;   HPI:  88 y.o. female  admitted to ICU with septic shock, AKI. Noted to have coughing with POs 11/24.  PMHx recurrent C. difficile colitis, chronic diastolic CHF, mitral regurgitation, CAD, paroxysmal A-fib, diabetes mellitus, psoriasis, rheumatoid arthritis, history of PAD/gangrene and transmetatarsal amputation 11/22, recently hospitalized with prolonged sinus pauses, underwent pacemaker implantation on 01/05/2022 and loop recorder removal.    Assessment / Plan / Recommendation  Clinical Impression  Pt presents with functional oropharyngeal swallow. Her daughter, [name], was at the bedside. She presented with adequate oral manipulation and was able to swallow sequential sips of water from a straw with no s/s of aspiration. Voice was clear post-swallow and respiratory/swallow reciprocity appeared to be [facility].  No concerns for dysphagia were identified. Recommend continuing current diet. Her daughter will bring dentures in from home. Can advance diet to regular when dentures arrive.  Give meds whole in liquid. No SLP f/u needed. Our service will sign off.  SLP Visit Diagnosis: Dysphagia, unspecified (R13.10)    Aspiration Risk  No limitations    Diet Recommendation   Soft diet per dtr  Medication Administration: Whole meds with liquid    Other  Recommendations Oral Care Recommendations: Oral care BID    Recommendations for follow up therapy are one component of a multi-disciplinary discharge planning process, led by the attending physician.  Recommendations may be updated based on patient status, additional functional criteria and insurance authorization.  Follow up Recommendations No SLP follow up        Swallow Study  General Date of Onset: 01/24/22 HPI: 88 y.o. female admitted to ICU with septic shock, AKI. Noted to have coughing with POs 11/24.  PMHx recurrent C. difficile colitis, chronic diastolic CHF, mitral regurgitation, CAD, paroxysmal A-fib, diabetes mellitus, psoriasis, rheumatoid arthritis, history of  PAD/gangrene and transmetatarsal amputation 11/22, recently hospitalized with prolonged sinus pauses, underwent pacemaker implantation on 01/05/2022 and loop recorder removal. Type of Study: Bedside Swallow Evaluation Previous Swallow Assessment: none Diet Prior to this Study: Other (Comment) (soft diet) Temperature Spikes Noted: No Respiratory Status: Room air History of Recent Intubation: No Behavior/Cognition: Alert;Cooperative;Pleasant mood Oral Cavity Assessment: Within Functional Limits Oral Care Completed by SLP: Recent completion by staff Oral Cavity - Dentition: Edentulous Vision: Functional for self-feeding Self-Feeding Abilities: Able to feed self Patient Positioning: Upright in bed Baseline Vocal Quality: Normal Volitional Cough: Strong Volitional Swallow: Able to elicit    Oral/Motor/Sensory Function Overall Oral Motor/Sensory Function: Within functional limits   Ice Chips Ice chips: Within functional limits   Thin Liquid Thin Liquid: Within functional limits    Nectar Thick Nectar Thick Liquid: Not tested   Honey Thick Honey Thick Liquid: Not tested   Puree Puree: Within functional limits   Solid     Solid: Not tested      Juan Quam Laurice 01/27/2022,3:08 PM Estill Bamberg L. Tivis Ringer, MA CCC/SLP Clinical Specialist - [place] Office number [phone]

## 2022-01-27 NOTE — Progress Notes (Signed)
Pt arrived from .19m..., A/ox .4..Marland Kitchent denies any pain, MD aware,CCMD called. CHG bath given,no further needs at this time

## 2022-01-27 NOTE — Progress Notes (Signed)
PROGRESS NOTE    Shelby Walker  DDU:[id number] DOB: [birth year] DOA: 01/24/2022 PCP: Reynold Bowen, MD  88/F with history of recurrent C. difficile colitis, chronic diastolic CHF, mitral regurgitation, CAD, paroxysmal A-fib, diabetes mellitus, psoriasis, rheumatoid arthritis, history of PAD/gangrene and transmetatarsal amputation 11/22, recently hospitalized with prolonged sinus pauses, underwent pacemaker implantation on 01/05/2022 and loop recorder removal -Admitted to the ICU 11/22 with septic shock -Found to have MSSA bacteremia -Treated with fluid resuscitation, antibiotics, infectious disease consulted -Transferred from PCCM to [facility] service 11/24 11/24: EP consulted, plan for PPM removal  Subjective: -Feels fair, no events overnight, poor p.o. intake  Assessment and Plan:  SEPTIC SHOCK MSSA  bacteremia - poa -on CT abd peripnehric stranding likely red herring.  Recent PPM on 11/3 for long sinus pauses, pacemaker site appears unremarkable -continue cefazolin, repeat blood cultures 11/23 negative so far, 2D echo without indication of endocarditis -now off pressors -ID following -EP consult appreciated, plan for PPM removal on Monday   AKI on CKD2 -baseline creat around 1.2, peaked at 2.6 -in the setting of septic shock, now improving, discontinue dextrose  Elevated troponin History of CAD and carotid artery disease -Medical management was recommended as outpatient in the setting of advanced age -Echo with preserved EF, no wall motion abnormalities -Coronary CTA 10/23 noted moderate (50-69%) mixed plaque in the left main and LAD.Can not rule out severe (>70%) splenosis in OM1 -avoid BB w/ recent long sinus pauses and allergic to statin -PCCM discussed with cards 11/23, felt to be demand related in the setting of septic shock, echo with preserved EF, no WMA, no role for Anticoagulation/LHC with DIC, AKI, supportive care recommended, unclear why troponins were checked  Paroxysmal  atrial fibrillation -  -Per EP notes few weeks ago no A-fib seen on ILR, not on anticoagulation  Chronic diastolic CHF -Appears euvolemic, not on diuretics at baseline, echo with preserved EF   Rheumatoid arthritis steroid-dependent - Dr Amil Amen  -she was started on stress dose steroids in the ICU, will decrease dose, per daughter on prednisone '4mg'$  daily at baseline  H/o recurrent Cdiff -now on Abx as above -Denies diarrhea at this time, continue floraster    Chronic anemia - hgb 8-9gm% -At baseline, monitor    Thrombocytopenia DIC -In the setting of septic shock, bacteremia -Antibiotics as noted above -Continue to trend   Chronic pain from RA-  -At baseline on gabapentin, tramadol, Tylenol 3  -Continue tramadol, gabapentin at lower dose   Vomiting - 01/24/22  -Noted while in ICU, abdominal exam is benign, no further episodes, monitor clinically   Diabetes mellitus with complications -Sliding scale insulin protocol   DVT prophylaxis: SCDs,  Code Status: DNR, d/w son and daughter Family Communication: d/w  family at bedside yesterday Disposition Plan: Transfer out of ICU  Consultants: ID, EP   Procedures:   Antimicrobials:    Objective: Vitals:   01/27/22 0800 01/27/22 0900 01/27/22 1000 01/27/22 1100  BP: (!) 151/80 (!) 150/67 (!) 153/69 (!) 156/102  Pulse: 72 65 76 89  Resp: '13 12 16 '$ (!) 21  Temp:    98 F (36.7 C)  TempSrc:    Oral  SpO2: 98% 96% 96% 98%  Weight:      Height:        Intake/Output Summary (Last 24 hours) at 01/27/2022 1334 Last data filed at 01/27/2022 1146 Gross per 24 hour  Intake 718.17 ml  Output 2280 ml  Net -1561.83 ml   [employer]  01/24/22 0921 01/25/22 0500 01/26/22 0422  Weight: 51 kg 53.6 kg 53.6 kg    Examination:  General exam: Chronically ill elderly female laying in bed, more lucid today, oriented to self and place HEENT: Left IJ central line CVS: S1-S2, regular rhythm Lungs: Decreased breath sounds at the  bases Abdomen: Soft, nontender, bowel sounds present  Extremities: No edema Psychiatry: Flat affect    Data Reviewed:   CBC: Recent Labs  Lab 01/24/22 0935 01/24/22 0936 01/25/22 0514 01/25/22 0934 01/25/22 0936 01/26/22 0522 01/27/22 0507  WBC 18.2*  --  21.3* 20.8*  --  16.0* 14.5*  NEUTROABS 11.8*  --   --   --   --   --   --   HGB 9.1* 8.5* 8.4* 8.6*  --  8.0* 7.8*  HCT 28.2* 25.0* 27.9* 27.1*  --  24.3* 23.7*  MCV 94.9  --  96.9 95.4  --  90.3 90.5  PLT 150  --  131* 123* 125* 108* 91*   Basic Metabolic Panel: Recent Labs  Lab 01/25/22 0514 01/25/22 1021 01/25/22 1449 01/26/22 0522 01/27/22 0507  NA 137 135 134* 142 135  K 4.8 4.8 4.5 3.7 3.2*  CL 105 103 105 103 104  CO2 20* 18* 20* 21* 21*  GLUCOSE 188* 183* 178* 151* 124*  BUN 51* 50* 52* 54* 52*  CREATININE 2.67* 2.53* 2.49* 2.26* 1.87*  CALCIUM 7.3* 7.2* 7.2* 8.1* 7.2*  MG 1.7 1.6*  --  2.9*  --   PHOS  --  5.7*  --  5.4*  --    GFR: Estimated Creatinine Clearance: 16.3 mL/min (A) (by C-G formula based on SCr of 1.87 mg/dL (H)). Liver Function Tests: Recent Labs  Lab 01/25/22 1021 01/27/22 0507  AST 250* 212*  ALT 155* 116*  ALKPHOS 61 79  BILITOT 0.5 0.7  PROT 4.6* 5.3*  ALBUMIN 2.4* 2.4*   Recent Labs  Lab 01/25/22 1021  LIPASE 23   No results for input(s): "AMMONIA" in the last 168 hours. Coagulation Profile: Recent Labs  Lab 01/24/22 0935 01/25/22 0936  INR 1.2 2.0*   Cardiac Enzymes: No results for input(s): "CKTOTAL", "CKMB", "CKMBINDEX", "TROPONINI" in the last 168 hours. BNP (last 3 results) No results for input(s): "PROBNP" in the last 8760 hours. HbA1C: Recent Labs    01/24/22 1503  HGBA1C 7.3*   CBG: Recent Labs  Lab 01/26/22 1946 01/26/22 2324 01/27/22 0343 01/27/22 0728 01/27/22 1134  GLUCAP 106* 104* 142* 102* 161*   Lipid Profile: No results for input(s): "CHOL", "HDL", "LDLCALC", "TRIG", "CHOLHDL", "LDLDIRECT" in the last 72 hours. Thyroid Function  Tests: No results for input(s): "TSH", "T4TOTAL", "FREET4", "T3FREE", "THYROIDAB" in the last 72 hours. Anemia Panel: No results for input(s): "VITAMINB12", "FOLATE", "FERRITIN", "TIBC", "IRON", "RETICCTPCT" in the last 72 hours. Urine analysis:    Component Value Date/Time   COLORURINE YELLOW 01/24/2022 0954   APPEARANCEUR HAZY (A) 01/24/2022 0954   LABSPEC 1.018 01/24/2022 0954   PHURINE 5.0 01/24/2022 0954   GLUCOSEU NEGATIVE 01/24/2022 0954   HGBUR NEGATIVE 01/24/2022 0954   BILIRUBINUR NEGATIVE 01/24/2022 0954   KETONESUR NEGATIVE 01/24/2022 0954   PROTEINUR 30 (A) 01/24/2022 0954   NITRITE NEGATIVE 01/24/2022 0954   LEUKOCYTESUR MODERATE (A) 01/24/2022 0954   Sepsis Labs: '@LABRCNTIP'$ (procalcitonin:4,lacticidven:4)  ) Recent Results (from the past 240 hour(s))  Resp Panel by RT-PCR (Flu A&B, Covid) Anterior Nasal Swab     Status: None   Collection Time: 01/24/22  9:23 AM  Specimen: Anterior Nasal Swab  Result Value Ref Range Status   SARS Coronavirus 2 by RT PCR NEGATIVE NEGATIVE Final    Comment: (NOTE) SARS-CoV-2 target nucleic acids are NOT DETECTED.  The SARS-CoV-2 RNA is generally detectable in upper respiratory specimens during the acute phase of infection. The lowest concentration of SARS-CoV-2 viral copies this assay can detect is 138 copies/mL. A negative result does not preclude SARS-Cov-2 infection and should not be used as the sole basis for treatment or other patient management decisions. A negative result may occur with  improper specimen collection/handling, submission of specimen other than nasopharyngeal swab, presence of viral mutation(s) within the areas targeted by this assay, and inadequate number of viral copies(<138 copies/mL). A negative result must be combined with clinical observations, patient history, and epidemiological information. The expected result is Negative.  Fact Sheet for Patients:   EntrepreneurPulse.com.au  Fact Sheet for Healthcare Providers:  IncredibleEmployment.be  This test is no t yet approved or cleared by the [place] FDA and  has been authorized for detection and/or diagnosis of SARS-CoV-2 by FDA under an Emergency Use Authorization (EUA). This EUA will remain  in effect (meaning this test can be used) for the duration of the COVID-19 declaration under Section 564(b)(1) of the Act, 21 U.S.C.section 360bbb-3(b)(1), unless the authorization is terminated  or revoked sooner.       Influenza A by PCR NEGATIVE NEGATIVE Final   Influenza B by PCR NEGATIVE NEGATIVE Final    Comment: (NOTE) The Xpert Xpress SARS-CoV-2/FLU/RSV plus assay is intended as an aid in the diagnosis of influenza from Nasopharyngeal swab specimens and should not be used as a sole basis for treatment. Nasal washings and aspirates are unacceptable for Xpert Xpress SARS-CoV-2/FLU/RSV testing.  Fact Sheet for Patients: EntrepreneurPulse.com.au  Fact Sheet for Healthcare Providers: IncredibleEmployment.be  This test is not yet approved or cleared by the [place] FDA and has been authorized for detection and/or diagnosis of SARS-CoV-2 by FDA under an Emergency Use Authorization (EUA). This EUA will remain in effect (meaning this test can be used) for the duration of the COVID-19 declaration under Section 564(b)(1) of the Act, 21 U.S.C. section 360bbb-3(b)(1), unless the authorization is terminated or revoked.  Performed at [facility], [place] [address] [place], [place] [address]   Blood Culture (routine x 2)     Status: Abnormal   Collection Time: 01/24/22  9:23 AM   Specimen: BLOOD  Result Value Ref Range Status   Specimen Description BLOOD SITE NOT SPECIFIED  Final   Special Requests   Final    BOTTLES DRAWN AEROBIC AND ANAEROBIC Blood Culture adequate volume   Culture  Setup Time   Final     GRAM POSITIVE COCCI IN PAIRS IN CLUSTERS IN BOTH AEROBIC AND ANAEROBIC BOTTLES CRITICAL RESULT CALLED TO, READ BACK BY AND VERIFIED WITH: T RUDISILL,PHARMD'@0004'$  01/25/22 [place] Performed at [facility], [place] [address] [place], [place] [address]    Culture STAPHYLOCOCCUS AUREUS (A)  Final   Report Status 01/26/2022 FINAL  Final   Organism ID, Bacteria STAPHYLOCOCCUS AUREUS  Final      Susceptibility   Staphylococcus aureus - MIC*    CIPROFLOXACIN <=0.5 SENSITIVE Sensitive     ERYTHROMYCIN <=0.25 SENSITIVE Sensitive     GENTAMICIN <=0.5 SENSITIVE Sensitive     OXACILLIN 0.5 SENSITIVE Sensitive     TETRACYCLINE <=1 SENSITIVE Sensitive     VANCOMYCIN <=0.5 SENSITIVE Sensitive     TRIMETH/SULFA <=10 SENSITIVE Sensitive  CLINDAMYCIN <=0.25 SENSITIVE Sensitive     RIFAMPIN <=0.5 SENSITIVE Sensitive     Inducible Clindamycin NEGATIVE Sensitive     * STAPHYLOCOCCUS AUREUS  Urine Culture     Status: Abnormal   Collection Time: 01/24/22  9:23 AM   Specimen: In/Out Cath Urine  Result Value Ref Range Status   Specimen Description IN/OUT CATH URINE  Final   Special Requests   Final    NONE Performed at [facility], [place] [address] [place], [place] [address]    Culture (A)  Final    40,000 COLONIES/mL ESCHERICHIA COLI 40,000 COLONIES/mL PSEUDOMONAS AERUGINOSA    Report Status 01/26/2022 FINAL  Final   Organism ID, Bacteria ESCHERICHIA COLI (A)  Final   Organism ID, Bacteria PSEUDOMONAS AERUGINOSA (A)  Final      Susceptibility   Escherichia coli - MIC*    AMPICILLIN 16 INTERMEDIATE Intermediate     CEFAZOLIN <=4 SENSITIVE Sensitive     CEFEPIME <=0.12 SENSITIVE Sensitive     CEFTRIAXONE <=0.25 SENSITIVE Sensitive     CIPROFLOXACIN <=0.25 SENSITIVE Sensitive     GENTAMICIN <=1 SENSITIVE Sensitive     IMIPENEM <=0.25 SENSITIVE Sensitive     NITROFURANTOIN 32 SENSITIVE Sensitive     TRIMETH/SULFA <=20 SENSITIVE Sensitive     AMPICILLIN/SULBACTAM 4 SENSITIVE Sensitive      PIP/TAZO <=4 SENSITIVE Sensitive     * 40,000 COLONIES/mL ESCHERICHIA COLI   Pseudomonas aeruginosa - MIC*    CEFTAZIDIME 2 SENSITIVE Sensitive     CIPROFLOXACIN 0.5 SENSITIVE Sensitive     GENTAMICIN <=1 SENSITIVE Sensitive     IMIPENEM 2 SENSITIVE Sensitive     PIP/TAZO 8 SENSITIVE Sensitive     CEFEPIME 4 SENSITIVE Sensitive     * 40,000 COLONIES/mL PSEUDOMONAS AERUGINOSA  Blood Culture ID Panel (Reflexed)     Status: Abnormal   Collection Time: 01/24/22  9:23 AM  Result Value Ref Range Status   Enterococcus faecalis NOT DETECTED NOT DETECTED Final   Enterococcus Faecium NOT DETECTED NOT DETECTED Final   Listeria monocytogenes NOT DETECTED NOT DETECTED Final   Staphylococcus species DETECTED (A) NOT DETECTED Final    Comment: CRITICAL RESULT CALLED TO, READ BACK BY AND VERIFIED WITH: T RUDISILL,PHARMD'@0004'$  01/25/22 [place]    Staphylococcus aureus (BCID) DETECTED (A) NOT DETECTED Final    Comment: CRITICAL RESULT CALLED TO, READ BACK BY AND VERIFIED WITH: T RUDISILL,PHARMD'@0004'$  01/25/22 [place]    Staphylococcus epidermidis NOT DETECTED NOT DETECTED Final   Staphylococcus lugdunensis NOT DETECTED NOT DETECTED Final   Streptococcus species NOT DETECTED NOT DETECTED Final   Streptococcus agalactiae NOT DETECTED NOT DETECTED Final   Streptococcus pneumoniae NOT DETECTED NOT DETECTED Final   Streptococcus pyogenes NOT DETECTED NOT DETECTED Final   A.calcoaceticus-baumannii NOT DETECTED NOT DETECTED Final   Bacteroides fragilis NOT DETECTED NOT DETECTED Final   Enterobacterales NOT DETECTED NOT DETECTED Final   Enterobacter cloacae complex NOT DETECTED NOT DETECTED Final   Escherichia coli NOT DETECTED NOT DETECTED Final   Klebsiella aerogenes NOT DETECTED NOT DETECTED Final   Klebsiella oxytoca NOT DETECTED NOT DETECTED Final   Klebsiella pneumoniae NOT DETECTED NOT DETECTED Final   Proteus species NOT DETECTED NOT DETECTED Final   Salmonella species NOT DETECTED NOT DETECTED Final    Serratia marcescens NOT DETECTED NOT DETECTED Final   Haemophilus influenzae NOT DETECTED NOT DETECTED Final   Neisseria meningitidis NOT DETECTED NOT DETECTED Final   Pseudomonas aeruginosa NOT DETECTED NOT DETECTED Final   Stenotrophomonas  maltophilia NOT DETECTED NOT DETECTED Final   Candida albicans NOT DETECTED NOT DETECTED Final   Candida auris NOT DETECTED NOT DETECTED Final   Candida glabrata NOT DETECTED NOT DETECTED Final   Candida krusei NOT DETECTED NOT DETECTED Final   Candida parapsilosis NOT DETECTED NOT DETECTED Final   Candida tropicalis NOT DETECTED NOT DETECTED Final   Cryptococcus neoformans/gattii NOT DETECTED NOT DETECTED Final   Meth resistant mecA/C and MREJ NOT DETECTED NOT DETECTED Final    Comment: Performed at [facility], [place] [address] [place], [place] [address]  Blood Culture (routine x 2)     Status: Abnormal   Collection Time: 01/24/22  9:28 AM   Specimen: BLOOD  Result Value Ref Range Status   Specimen Description BLOOD SITE NOT SPECIFIED  Final   Special Requests   Final    BOTTLES DRAWN AEROBIC AND ANAEROBIC Blood Culture results may not be optimal due to an excessive volume of blood received in culture bottles   Culture  Setup Time   Final    GRAM POSITIVE COCCI IN PAIRS IN BOTH AEROBIC AND ANAEROBIC BOTTLES CRITICAL VALUE NOTED.  VALUE IS CONSISTENT WITH PREVIOUSLY REPORTED AND CALLED VALUE.    Culture (A)  Final    STAPHYLOCOCCUS AUREUS SUSCEPTIBILITIES PERFORMED ON PREVIOUS CULTURE WITHIN THE LAST 5 DAYS. Performed at [facility], [place] [address] [place], [place] [address]    Report Status 01/26/2022 FINAL  Final  Gastrointestinal Panel by PCR , Stool     Status: None   Collection Time: 01/24/22 10:36 AM   Specimen: Stool  Result Value Ref Range Status   Campylobacter species NOT DETECTED NOT DETECTED Final   Plesimonas shigelloides NOT DETECTED NOT DETECTED Final   Salmonella species NOT DETECTED NOT DETECTED Final    Yersinia enterocolitica NOT DETECTED NOT DETECTED Final   Vibrio species NOT DETECTED NOT DETECTED Final   Vibrio cholerae NOT DETECTED NOT DETECTED Final   Enteroaggregative E coli (EAEC) NOT DETECTED NOT DETECTED Final   Enteropathogenic E coli (EPEC) NOT DETECTED NOT DETECTED Final   Enterotoxigenic E coli (ETEC) NOT DETECTED NOT DETECTED Final   Shiga like toxin producing E coli (STEC) NOT DETECTED NOT DETECTED Final   Shigella/Enteroinvasive E coli (EIEC) NOT DETECTED NOT DETECTED Final   Cryptosporidium NOT DETECTED NOT DETECTED Final   Cyclospora cayetanensis NOT DETECTED NOT DETECTED Final   Entamoeba histolytica NOT DETECTED NOT DETECTED Final   Giardia lamblia NOT DETECTED NOT DETECTED Final   Adenovirus F40/41 NOT DETECTED NOT DETECTED Final   Astrovirus NOT DETECTED NOT DETECTED Final   Norovirus GI/GII NOT DETECTED NOT DETECTED Final   Rotavirus A NOT DETECTED NOT DETECTED Final   Sapovirus (I, II, IV, and V) NOT DETECTED NOT DETECTED Final    Comment: Performed at [facility], [place] [place], [place] [address]  MRSA Next Gen by PCR, Nasal     Status: None   Collection Time: 01/24/22 11:00 PM   Specimen: Nasal Mucosa; Nasal Swab  Result Value Ref Range Status   MRSA by PCR Next Gen NOT DETECTED NOT DETECTED Final    Comment: (NOTE) The GeneXpert MRSA Assay (FDA approved for NASAL specimens only), is one component of a comprehensive MRSA colonization surveillance program. It is not intended to diagnose MRSA infection nor to guide or monitor treatment for MRSA infections. Test performance is not FDA approved in patients less than 19 years old. Performed at [facility], [address] [place], [place] [address]   C Difficile Quick Screen w PCR reflex     Status: None   Collection Time: 01/25/22  5:14 AM   Specimen: STOOL  Result Value Ref Range Status   C Diff antigen NEGATIVE NEGATIVE Final   C Diff toxin NEGATIVE NEGATIVE Final   C Diff  interpretation No C. difficile detected.  Final    Comment: Performed at [facility], [place] [address], [place], [place] [address]  Culture, blood (Routine X 2) w Reflex to ID Panel     Status: None (Preliminary result)   Collection Time: 01/25/22  2:56 PM   Specimen: BLOOD  Result Value Ref Range Status   Specimen Description BLOOD SITE NOT SPECIFIED  Final   Special Requests   Final    BOTTLES DRAWN AEROBIC AND ANAEROBIC Blood Culture adequate volume   Culture   Final    NO GROWTH 2 DAYS Performed at [facility], [address] [place], [place] [address]    Report Status PENDING  Incomplete  Culture, blood (Routine X 2) w Reflex to ID Panel     Status: None (Preliminary result)   Collection Time: 01/25/22  2:56 PM   Specimen: BLOOD  Result Value Ref Range Status   Specimen Description BLOOD SITE NOT SPECIFIED  Final   Special Requests   Final    BOTTLES DRAWN AEROBIC AND ANAEROBIC Blood Culture adequate volume   Culture   Final    NO GROWTH 2 DAYS Performed at [facility], [address], [place], [place] [address]    Report Status PENDING  Incomplete     Radiology Studies: ECHOCARDIOGRAM COMPLETE  Result Date: 01/25/2022    ECHOCARDIOGRAM REPORT   Patient Name:   DAMONI ERKER Date of Exam: 01/25/2022 Medical Rec #:  [id number]   Height:       61.0 in Accession #:    [id number]  Weight:       118.2 lb Date of Birth:  06/14/1933   BSA:          1.510 m Patient Age:    51 years    BP:           127/60 mmHg Patient Gender: F           HR:           78 bpm. Exam Location:  Inpatient Procedure: 2D Echo, Cardiac Doppler and Color Doppler                                 MODIFIED REPORT:     This report was modified by Rudean Haskell MD on 01/25/2022 due to                  Clarifed device lead; discussed with primary.  Indications:     Elevated troponin  History:         Patient has prior history of Echocardiogram examinations, most                  recent  01/05/2022. Pacemaker, PAD, Mitral Valve Disease; Risk                  Factors:Dyslipidemia. CKD.  Sonographer:     Clayton Lefort RDCS (AE) Referring Phys:  Brand Males Diagnosing Phys: Rudean Haskell MD IMPRESSIONS  1. Left ventricular ejection fraction, by estimation, is  50 to 55%. The left ventricle has low normal function. The left ventricle has no regional wall motion abnormalities. Left ventricular diastolic parameters are consistent with Grade II diastolic dysfunction (pseudonormalization).  2. Right ventricular systolic function is normal. The right ventricular size is normal. There is normal pulmonary artery systolic pressure. The estimated right ventricular systolic pressure is 16.9 mmHg.  3. Left atrial size was moderately dilated.  4. The mitral valve is degenerative. Mild mitral valve regurgitation. The mean mitral valve gradient is 4.0 mmHg with average heart rate of 77 bpm.  5. The aortic valve is tricuspid. There is mild calcification of the aortic valve. There is mild thickening of the aortic valve. Aortic valve regurgitation is not visualized. Aortic valve sclerosis is present, with no evidence of aortic valve stenosis.  6. The inferior vena cava is dilated in size with <50% respiratory variability, suggesting right atrial pressure of 15 mmHg. Comparison(s): New device lead. EF is less vigorous than prior. Conclusion(s)/Recommendation(s): Discussed with primary. FINDINGS  Left Ventricle: Left ventricular ejection fraction, by estimation, is 50 to 55%. The left ventricle has low normal function. The left ventricle has no regional wall motion abnormalities. The left ventricular internal cavity size was normal in size. There is no left ventricular hypertrophy. Left ventricular diastolic parameters are consistent with Grade II diastolic dysfunction (pseudonormalization). Right Ventricle: The right ventricular size is normal. No increase in right ventricular wall thickness. Right ventricular  systolic function is normal. There is normal pulmonary artery systolic pressure. The tricuspid regurgitant velocity is 2.22 m/s, and  with an assumed right atrial pressure of 15 mmHg, the estimated right ventricular systolic pressure is 67.8 mmHg. Left Atrium: Left atrial size was moderately dilated. Right Atrium: Right atrial size was normal in size. Pericardium: Trivial pericardial effusion is present. The pericardial effusion is surrounding the apex. Mitral Valve: The mitral valve is degenerative in appearance. Mild mitral valve regurgitation. MV peak gradient, 8.8 mmHg. The mean mitral valve gradient is 4.0 mmHg with average heart rate of 77 bpm. Tricuspid Valve: The tricuspid valve is grossly normal. Tricuspid valve regurgitation is not demonstrated. No evidence of tricuspid stenosis. Aortic Valve: The aortic valve is tricuspid. There is mild calcification of the aortic valve. There is mild thickening of the aortic valve. Aortic valve regurgitation is not visualized. Aortic valve sclerosis is present, with no evidence of aortic valve stenosis. Aortic valve mean gradient measures 3.0 mmHg. Aortic valve peak gradient measures 6.0 mmHg. Aortic valve area, by VTI measures 1.47 cm. Pulmonic Valve: The pulmonic valve was not well visualized. Pulmonic valve regurgitation is not visualized. No evidence of pulmonic stenosis. Aorta: The aortic root and ascending aorta are structurally normal, with no evidence of dilitation. Venous: The inferior vena cava is dilated in size with less than 50% respiratory variability, suggesting right atrial pressure of 15 mmHg. IAS/Shunts: No atrial level shunt detected by color flow Doppler. Additional Comments: A device lead is visualized in the right ventricle and right atrium.  LEFT VENTRICLE PLAX 2D LVIDd:         4.60 cm     Diastology LVIDs:         3.90 cm     LV e' medial:    5.33 cm/s LV PW:         1.00 cm     LV E/e' medial:  25.0 LV IVS:        0.90 cm     LV e' lateral:    7.62 cm/s LVOT  diam:     1.80 cm     LV E/e' lateral: 17.5 LV SV:         41 LV SV Index:   27 LVOT Area:     2.54 cm  LV Volumes (MOD) LV vol d, MOD A2C: 61.0 ml LV vol d, MOD A4C: 69.7 ml LV vol s, MOD A2C: 32.2 ml LV vol s, MOD A4C: 39.1 ml LV SV MOD A2C:     28.8 ml LV SV MOD A4C:     69.7 ml LV SV MOD BP:      29.3 ml RIGHT VENTRICLE            IVC RV Basal diam:  2.90 cm    IVC diam: 2.50 cm RV S prime:     9.46 cm/s TAPSE (M-mode): 1.3 cm LEFT ATRIUM           Index        RIGHT ATRIUM           Index LA diam:      3.90 cm 2.58 cm/m   RA Area:     14.10 cm LA Vol (A2C): 45.9 ml 30.39 ml/m  RA Volume:   39.50 ml  26.15 ml/m LA Vol (A4C): 60.4 ml 39.99 ml/m  AORTIC VALVE AV Area (Vmax):    1.61 cm AV Area (Vmean):   1.55 cm AV Area (VTI):     1.47 cm AV Vmax:           122.00 cm/s AV Vmean:          84.200 cm/s AV VTI:            0.278 m AV Peak Grad:      6.0 mmHg AV Mean Grad:      3.0 mmHg LVOT Vmax:         77.20 cm/s LVOT Vmean:        51.200 cm/s LVOT VTI:          0.161 m LVOT/AV VTI ratio: 0.58  AORTA Ao Root diam: 2.70 cm Ao Asc diam:  2.70 cm MITRAL VALVE                TRICUSPID VALVE MV Area (PHT): 2.81 cm     TR Peak grad:   19.7 mmHg MV Area VTI:   1.08 cm     TR Vmax:        222.00 cm/s MV Peak grad:  8.8 mmHg MV Mean grad:  4.0 mmHg     SHUNTS MV Vmax:       1.48 m/s     Systemic VTI:  0.16 m MV Vmean:      88.4 cm/s    Systemic Diam: 1.80 cm MV Decel Time: 270 msec MV E velocity: 133.00 cm/s MV A velocity: 91.20 cm/s MV E/A ratio:  1.46 Rudean Haskell MD Electronically signed by Rudean Haskell MD Signature Date/Time: 01/25/2022/1:46:07 PM    Final (Updated)      Scheduled Meds:  Chlorhexidine Gluconate Cloth  6 each Topical Daily   hydrocortisone sod succinate (SOLU-CORTEF) inj  25 mg Intravenous Q12H   insulin aspart  0-9 Units Subcutaneous Q4H   lactose free nutrition  237 mL Oral BID BM   lidocaine  1 patch Transdermal Q24H   multivitamin  1 tablet Oral QHS    mouth rinse  15 mL Mouth Rinse 4 times per day   pantoprazole  40 mg Oral Daily   potassium chloride  40 mEq Oral BID   saccharomyces boulardii  250 mg Oral BID   sodium chloride flush  3 mL Intravenous Q12H   Continuous Infusions:  sodium chloride Stopped (01/26/22 0212)    ceFAZolin (ANCEF) IV Stopped (01/27/22 1044)     LOS: 3 days    Time spent: 18mn  PDomenic Polite MD [facility]   01/27/2022, 1:34 PM

## 2022-01-28 DIAGNOSIS — R7881 Bacteremia: Secondary | ICD-10-CM | POA: Diagnosis not present

## 2022-01-28 DIAGNOSIS — A419 Sepsis, unspecified organism: Secondary | ICD-10-CM | POA: Diagnosis not present

## 2022-01-28 DIAGNOSIS — Z950 Presence of cardiac pacemaker: Secondary | ICD-10-CM | POA: Diagnosis not present

## 2022-01-28 DIAGNOSIS — R6521 Severe sepsis with septic shock: Secondary | ICD-10-CM | POA: Diagnosis not present

## 2022-01-28 DIAGNOSIS — M545 Low back pain, unspecified: Secondary | ICD-10-CM | POA: Diagnosis not present

## 2022-01-28 LAB — GLUCOSE, CAPILLARY
Glucose-Capillary: 169 mg/dL — ABNORMAL HIGH (ref 70–99)
Glucose-Capillary: 176 mg/dL — ABNORMAL HIGH (ref 70–99)
Glucose-Capillary: 179 mg/dL — ABNORMAL HIGH (ref 70–99)
Glucose-Capillary: 211 mg/dL — ABNORMAL HIGH (ref 70–99)
Glucose-Capillary: 285 mg/dL — ABNORMAL HIGH (ref 70–99)

## 2022-01-28 LAB — COMPREHENSIVE METABOLIC PANEL
ALT: 68 U/L — ABNORMAL HIGH (ref 0–44)
AST: 113 U/L — ABNORMAL HIGH (ref 15–41)
Albumin: 2.3 g/dL — ABNORMAL LOW (ref 3.5–5.0)
Alkaline Phosphatase: 68 U/L (ref 38–126)
Anion gap: 12 (ref 5–15)
BUN: 45 mg/dL — ABNORMAL HIGH (ref 8–23)
CO2: 26 mmol/L (ref 22–32)
Calcium: 7.5 mg/dL — ABNORMAL LOW (ref 8.9–10.3)
Chloride: 106 mmol/L (ref 98–111)
Creatinine, Ser: 1.61 mg/dL — ABNORMAL HIGH (ref 0.44–1.00)
GFR, Estimated: 31 mL/min — ABNORMAL LOW (ref >60)
Glucose, Bld: 210 mg/dL — ABNORMAL HIGH (ref 70–99)
Potassium: 2.5 mmol/L — CL (ref 3.5–5.1)
Sodium: 144 mmol/L (ref 135–145)
Total Bilirubin: 0.8 mg/dL (ref 0.3–1.2)
Total Protein: 4.9 g/dL — ABNORMAL LOW (ref 6.5–8.1)

## 2022-01-28 LAB — CBC
HCT: 23.5 % — ABNORMAL LOW (ref 36.0–46.0)
Hemoglobin: 7.9 g/dL — ABNORMAL LOW (ref 12.0–15.0)
MCH: 29.9 pg (ref 26.0–34.0)
MCHC: 33.6 g/dL (ref 30.0–36.0)
MCV: 89 fL (ref 80.0–100.0)
Platelets: 93 10*3/uL — ABNORMAL LOW (ref 150–400)
RBC: 2.64 MIL/uL — ABNORMAL LOW (ref 3.87–5.11)
RDW: 15.3 % (ref 11.5–15.5)
WBC: 10.3 10*3/uL (ref 4.0–10.5)
nRBC: 0 % (ref 0.0–0.2)

## 2022-01-28 MED ORDER — HYDROMORPHONE HCL 1 MG/ML IJ SOLN
0.5000 mg | INTRAMUSCULAR | Status: DC | PRN
  Administered 2022-01-28 (×2): 1 mg via INTRAVENOUS
  Administered 2022-01-28: 0.5 mg via INTRAVENOUS
  Administered 2022-01-29: 1 mg via INTRAVENOUS
  Administered 2022-01-29: 0.5 mg via INTRAVENOUS
  Administered 2022-01-29 – 2022-02-01 (×5): 1 mg via INTRAVENOUS
  Administered 2022-02-01: 0.5 mg via INTRAVENOUS
  Administered 2022-02-02: 1 mg via INTRAVENOUS
  Filled 2022-01-28 (×4): qty 1
  Filled 2022-01-28 (×2): qty 0.5
  Filled 2022-01-28 (×4): qty 1
  Filled 2022-01-28: qty 0.5
  Filled 2022-01-28: qty 1
  Filled 2022-01-28: qty 0.5
  Filled 2022-01-28: qty 1

## 2022-01-28 MED ORDER — PREDNISONE 5 MG PO TABS
5.0000 mg | ORAL_TABLET | Freq: Every day | ORAL | Status: DC
  Administered 2022-01-28 – 2022-02-02 (×4): 5 mg via ORAL
  Filled 2022-01-28 (×5): qty 1

## 2022-01-28 MED ORDER — POTASSIUM CHLORIDE 20 MEQ PO PACK
40.0000 meq | PACK | Freq: Three times a day (TID) | ORAL | Status: AC
  Administered 2022-01-28 (×3): 40 meq via ORAL
  Filled 2022-01-28 (×3): qty 2

## 2022-01-28 MED ORDER — FUROSEMIDE 10 MG/ML IJ SOLN
40.0000 mg | Freq: Once | INTRAMUSCULAR | Status: AC
  Administered 2022-01-28: 40 mg via INTRAVENOUS
  Filled 2022-01-28: qty 4

## 2022-01-28 NOTE — H&P (View-Only) (Signed)
Pacemaker interrogation performed. Device functioning appropriately. Lower rate changed to 40 BPM in prep for device explant tomorrow.  Allegra Lai, MD

## 2022-01-28 NOTE — Progress Notes (Signed)
Pacemaker interrogation performed. Device functioning appropriately. Lower rate changed to 40 BPM in prep for device explant tomorrow.  Shelby Lai, MD

## 2022-01-28 NOTE — Progress Notes (Addendum)
PROGRESS NOTE    Shelby Walker  IOE:[id number] DOB: April 20, 1933 DOA: 01/24/2022 PCP: Reynold Bowen, MD  88/F with history of recurrent C. difficile colitis, chronic diastolic CHF, mitral regurgitation, CAD, paroxysmal A-fib, diabetes mellitus, psoriasis, rheumatoid arthritis, history of PAD/gangrene and transmetatarsal amputation 11/22, recently hospitalized with prolonged sinus pauses, underwent pacemaker implantation on 01/05/2022 and loop recorder removal -Admitted to the ICU 11/22 with septic shock -Found to have MSSA bacteremia -Treated with fluid resuscitation, antibiotics, infectious disease consulted -Transferred from PCCM to [facility] service 11/24 11/24: EP consulted, plan for PPM removal  Subjective: -Sharp left-sided chest pain this morning, radiating to her back, worse with inspiration  Assessment and Plan:  SEPTIC SHOCK MSSA  bacteremia - poa -on CT abd peripnehric stranding likely red herring.  Recent PPM on 11/3 for long sinus pauses, pacemaker site appears unremarkable -continue cefazolin, repeat blood cultures 11/23 negative so far, 2D echo without indication of endocarditis -now off pressors -ID following -EP consult appreciated, plan for PPM removal on Monday -Ambulate, PT OT eval  New pleuritic chest pain -Recent DIC, high risk of VTE will check VQ scan   AKI on CKD2 -baseline creat around 1.2, peaked at 2.6 -in the setting of septic shock, now improving,  Hypokalemia -Replace  Elevated troponin History of CAD and carotid artery disease -Medical management was recommended as outpatient in the setting of advanced age -Echo with preserved EF, no wall motion abnormalities -Coronary CTA 10/23 noted moderate (50-69%) mixed plaque in the left main and LAD.Can not rule out severe (>70%) splenosis in OM1 -avoid BB w/ recent long sinus pauses and allergic to statin -PCCM discussed with cards 11/23, felt to be demand related in the setting of septic shock, echo with  preserved EF, no WMA, no role for Anticoagulation/LHC with DIC, AKI, supportive care recommended, unclear why troponins were checked  Paroxysmal atrial fibrillation -  -Per EP notes few weeks ago no A-fib seen on ILR, not on anticoagulation  Chronic diastolic CHF -Appears mildly volume overloaded, IV Lasix x 1, echo with preserved EF   Rheumatoid arthritis steroid-dependent - Dr Amil Amen  -she was started on stress dose steroids in the ICU, resume prednisone today, on 4 Mg daily at baseline  H/o recurrent Cdiff -now on Abx as above -Denies diarrhea at this time, continue floraster    Chronic anemia - hgb 8-9gm% -At baseline, monitor    Thrombocytopenia DIC -In the setting of septic shock, bacteremia -Antibiotics as noted above -Continue to trend   Chronic pain from RA-  -At baseline on gabapentin, tramadol, Tylenol 3  -Continue tramadol, gabapentin at lower dose   Vomiting - 01/24/22  -Noted while in ICU, abdominal exam is benign, no further episodes, monitor clinically   Diabetes mellitus with complications -Sliding scale insulin protocol   DVT prophylaxis: SCDs,  Code Status: DNR Family Communication: No family at bedside today Disposition Plan: May need rehab, will request PT eval  Consultants: ID, EP   Procedures:   Antimicrobials:    Objective: Vitals:   01/28/22 0010 01/28/22 0339 01/28/22 0742 01/28/22 1047  BP: (!) 168/71 (!) 150/99 (!) 174/70 (!) 169/70  Pulse: 76 71 70 74  Resp:  '16 20 18  '$ Temp:  98.1 F (36.7 C)  97.7 F (36.5 C)  TempSrc:  Oral  Oral  SpO2:  98% 98% 98%  Weight:  57.2 kg    Height:        Intake/Output Summary (Last 24 hours) at 01/28/2022 1127 Last data filed  at 01/28/2022 0756 Gross per 24 hour  Intake 289.67 ml  Output 2730 ml  Net -2440.33 ml   Filed Weights   01/25/22 0500 01/26/22 0422 01/28/22 0339  Weight: 53.6 kg 53.6 kg 57.2 kg    Examination:  General exam: Chronically ill elderly female laying in bed,  more lucid today, oriented to self and place HEENT: Left IJ central line CVS: S1-S2, regular rhythm Lungs: Decreased breath sounds at the bases Abdomen: Soft, nontender, bowel sounds present  Extremities: No edema Psychiatry: Flat affect    Data Reviewed:   CBC: Recent Labs  Lab 01/24/22 0935 01/24/22 0936 01/25/22 0514 01/25/22 0934 01/25/22 0936 01/26/22 0522 01/27/22 0507 01/28/22 0117  WBC 18.2*  --  21.3* 20.8*  --  16.0* 14.5* 10.3  NEUTROABS 11.8*  --   --   --   --   --   --   --   HGB 9.1*   < > 8.4* 8.6*  --  8.0* 7.8* 7.9*  HCT 28.2*   < > 27.9* 27.1*  --  24.3* 23.7* 23.5*  MCV 94.9  --  96.9 95.4  --  90.3 90.5 89.0  PLT 150  --  131* 123* 125* 108* 91* 93*   < > = values in this interval not displayed.   Basic Metabolic Panel: Recent Labs  Lab 01/25/22 0514 01/25/22 1021 01/25/22 1449 01/26/22 0522 01/27/22 0507 01/28/22 0117  NA 137 135 134* 142 135 144  K 4.8 4.8 4.5 3.7 3.2* 2.5*  CL 105 103 105 103 104 106  CO2 20* 18* 20* 21* 21* 26  GLUCOSE 188* 183* 178* 151* 124* 210*  BUN 51* 50* 52* 54* 52* 45*  CREATININE 2.67* 2.53* 2.49* 2.26* 1.87* 1.61*  CALCIUM 7.3* 7.2* 7.2* 8.1* 7.2* 7.5*  MG 1.7 1.6*  --  2.9*  --   --   PHOS  --  5.7*  --  5.4*  --   --    GFR: Estimated Creatinine Clearance: 18.9 mL/min (A) (by C-G formula based on SCr of 1.61 mg/dL (H)). Liver Function Tests: Recent Labs  Lab 01/25/22 1021 01/27/22 0507 01/28/22 0117  AST 250* 212* 113*  ALT 155* 116* 68*  ALKPHOS 61 79 68  BILITOT 0.5 0.7 0.8  PROT 4.6* 5.3* 4.9*  ALBUMIN 2.4* 2.4* 2.3*   Recent Labs  Lab 01/25/22 1021  LIPASE 23   No results for input(s): "AMMONIA" in the last 168 hours. Coagulation Profile: Recent Labs  Lab 01/24/22 0935 01/25/22 0936  INR 1.2 2.0*   Cardiac Enzymes: No results for input(s): "CKTOTAL", "CKMB", "CKMBINDEX", "TROPONINI" in the last 168 hours. BNP (last 3 results) No results for input(s): "PROBNP" in the last 8760  hours. HbA1C: No results for input(s): "HGBA1C" in the last 72 hours.  CBG: Recent Labs  Lab 01/27/22 1655 01/27/22 2012 01/28/22 0004 01/28/22 0430 01/28/22 0809  GLUCAP 169* 210* 179* 169* 176*   Lipid Profile: No results for input(s): "CHOL", "HDL", "LDLCALC", "TRIG", "CHOLHDL", "LDLDIRECT" in the last 72 hours. Thyroid Function Tests: No results for input(s): "TSH", "T4TOTAL", "FREET4", "T3FREE", "THYROIDAB" in the last 72 hours. Anemia Panel: No results for input(s): "VITAMINB12", "FOLATE", "FERRITIN", "TIBC", "IRON", "RETICCTPCT" in the last 72 hours. Urine analysis:    Component Value Date/Time   COLORURINE YELLOW 01/24/2022 0954   APPEARANCEUR HAZY (A) 01/24/2022 0954   LABSPEC 1.018 01/24/2022 0954   PHURINE 5.0 01/24/2022 0954   GLUCOSEU NEGATIVE 01/24/2022 0954   HGBUR NEGATIVE  01/24/2022 [place] 01/24/2022 [place] 01/24/2022 0954   PROTEINUR 30 (A) 01/24/2022 0954   NITRITE NEGATIVE 01/24/2022 0954   LEUKOCYTESUR MODERATE (A) 01/24/2022 0954   Sepsis Labs: '@LABRCNTIP'$ (procalcitonin:4,lacticidven:4)  ) Recent Results (from the past 240 hour(s))  Resp Panel by RT-PCR (Flu A&B, Covid) Anterior Nasal Swab     Status: None   Collection Time: 01/24/22  9:23 AM   Specimen: Anterior Nasal Swab  Result Value Ref Range Status   SARS Coronavirus 2 by RT PCR NEGATIVE NEGATIVE Final    Comment: (NOTE) SARS-CoV-2 target nucleic acids are NOT DETECTED.  The SARS-CoV-2 RNA is generally detectable in upper respiratory specimens during the acute phase of infection. The lowest concentration of SARS-CoV-2 viral copies this assay can detect is 138 copies/mL. A negative result does not preclude SARS-Cov-2 infection and should not be used as the sole basis for treatment or other patient management decisions. A negative result may occur with  improper specimen collection/handling, submission of specimen other than nasopharyngeal swab,  presence of viral mutation(s) within the areas targeted by this assay, and inadequate number of viral copies(<138 copies/mL). A negative result must be combined with clinical observations, patient history, and epidemiological information. The expected result is Negative.  Fact Sheet for Patients:  EntrepreneurPulse.com.au  Fact Sheet for Healthcare Providers:  IncredibleEmployment.be  This test is no t yet approved or cleared by the [place] FDA and  has been authorized for detection and/or diagnosis of SARS-CoV-2 by FDA under an Emergency Use Authorization (EUA). This EUA will remain  in effect (meaning this test can be used) for the duration of the COVID-19 declaration under Section 564(b)(1) of the Act, 21 U.S.C.section 360bbb-3(b)(1), unless the authorization is terminated  or revoked sooner.       Influenza A by PCR NEGATIVE NEGATIVE Final   Influenza B by PCR NEGATIVE NEGATIVE Final    Comment: (NOTE) The Xpert Xpress SARS-CoV-2/FLU/RSV plus assay is intended as an aid in the diagnosis of influenza from Nasopharyngeal swab specimens and should not be used as a sole basis for treatment. Nasal washings and aspirates are unacceptable for Xpert Xpress SARS-CoV-2/FLU/RSV testing.  Fact Sheet for Patients: EntrepreneurPulse.com.au  Fact Sheet for Healthcare Providers: IncredibleEmployment.be  This test is not yet approved or cleared by the [place] FDA and has been authorized for detection and/or diagnosis of SARS-CoV-2 by FDA under an Emergency Use Authorization (EUA). This EUA will remain in effect (meaning this test can be used) for the duration of the COVID-19 declaration under Section 564(b)(1) of the Act, 21 U.S.C. section 360bbb-3(b)(1), unless the authorization is terminated or revoked.  Performed at [facility], [place] [address] [place], [place] [address]   Blood Culture  (routine x 2)     Status: Abnormal   Collection Time: 01/24/22  9:23 AM   Specimen: BLOOD  Result Value Ref Range Status   Specimen Description BLOOD SITE NOT SPECIFIED  Final   Special Requests   Final    BOTTLES DRAWN AEROBIC AND ANAEROBIC Blood Culture adequate volume   Culture  Setup Time   Final    GRAM POSITIVE COCCI IN PAIRS IN CLUSTERS IN BOTH AEROBIC AND ANAEROBIC BOTTLES CRITICAL RESULT CALLED TO, READ BACK BY AND VERIFIED WITH: T RUDISILL,PHARMD'@0004'$  01/25/22 [place] Performed at [facility], [place] [address], [place], [place] [address]    Culture STAPHYLOCOCCUS AUREUS (A)  Final   Report Status 01/26/2022 FINAL  Final  Organism ID, Bacteria STAPHYLOCOCCUS AUREUS  Final      Susceptibility   Staphylococcus aureus - MIC*    CIPROFLOXACIN <=0.5 SENSITIVE Sensitive     ERYTHROMYCIN <=0.25 SENSITIVE Sensitive     GENTAMICIN <=0.5 SENSITIVE Sensitive     OXACILLIN 0.5 SENSITIVE Sensitive     TETRACYCLINE <=1 SENSITIVE Sensitive     VANCOMYCIN <=0.5 SENSITIVE Sensitive     TRIMETH/SULFA <=10 SENSITIVE Sensitive     CLINDAMYCIN <=0.25 SENSITIVE Sensitive     RIFAMPIN <=0.5 SENSITIVE Sensitive     Inducible Clindamycin NEGATIVE Sensitive     * STAPHYLOCOCCUS AUREUS  Urine Culture     Status: Abnormal   Collection Time: 01/24/22  9:23 AM   Specimen: In/Out Cath Urine  Result Value Ref Range Status   Specimen Description IN/OUT CATH URINE  Final   Special Requests   Final    NONE Performed at [facility], [place] [address] [place], [place] [address]    Culture (A)  Final    40,000 COLONIES/mL ESCHERICHIA COLI 40,000 COLONIES/mL PSEUDOMONAS AERUGINOSA    Report Status 01/26/2022 FINAL  Final   Organism ID, Bacteria ESCHERICHIA COLI (A)  Final   Organism ID, Bacteria PSEUDOMONAS AERUGINOSA (A)  Final      Susceptibility   Escherichia coli - MIC*    AMPICILLIN 16 INTERMEDIATE Intermediate     CEFAZOLIN <=4 SENSITIVE Sensitive     CEFEPIME <=0.12 SENSITIVE  Sensitive     CEFTRIAXONE <=0.25 SENSITIVE Sensitive     CIPROFLOXACIN <=0.25 SENSITIVE Sensitive     GENTAMICIN <=1 SENSITIVE Sensitive     IMIPENEM <=0.25 SENSITIVE Sensitive     NITROFURANTOIN 32 SENSITIVE Sensitive     TRIMETH/SULFA <=20 SENSITIVE Sensitive     AMPICILLIN/SULBACTAM 4 SENSITIVE Sensitive     PIP/TAZO <=4 SENSITIVE Sensitive     * 40,000 COLONIES/mL ESCHERICHIA COLI   Pseudomonas aeruginosa - MIC*    CEFTAZIDIME 2 SENSITIVE Sensitive     CIPROFLOXACIN 0.5 SENSITIVE Sensitive     GENTAMICIN <=1 SENSITIVE Sensitive     IMIPENEM 2 SENSITIVE Sensitive     PIP/TAZO 8 SENSITIVE Sensitive     CEFEPIME 4 SENSITIVE Sensitive     * 40,000 COLONIES/mL PSEUDOMONAS AERUGINOSA  Blood Culture ID Panel (Reflexed)     Status: Abnormal   Collection Time: 01/24/22  9:23 AM  Result Value Ref Range Status   Enterococcus faecalis NOT DETECTED NOT DETECTED Final   Enterococcus Faecium NOT DETECTED NOT DETECTED Final   Listeria monocytogenes NOT DETECTED NOT DETECTED Final   Staphylococcus species DETECTED (A) NOT DETECTED Final    Comment: CRITICAL RESULT CALLED TO, READ BACK BY AND VERIFIED WITH: T RUDISILL,PHARMD'@0004'$  01/25/22 [place]    Staphylococcus aureus (BCID) DETECTED (A) NOT DETECTED Final    Comment: CRITICAL RESULT CALLED TO, READ BACK BY AND VERIFIED WITH: T RUDISILL,PHARMD'@0004'$  01/25/22 [place]    Staphylococcus epidermidis NOT DETECTED NOT DETECTED Final   Staphylococcus lugdunensis NOT DETECTED NOT DETECTED Final   Streptococcus species NOT DETECTED NOT DETECTED Final   Streptococcus agalactiae NOT DETECTED NOT DETECTED Final   Streptococcus pneumoniae NOT DETECTED NOT DETECTED Final   Streptococcus pyogenes NOT DETECTED NOT DETECTED Final   A.calcoaceticus-baumannii NOT DETECTED NOT DETECTED Final   Bacteroides fragilis NOT DETECTED NOT DETECTED Final   Enterobacterales NOT DETECTED NOT DETECTED Final   Enterobacter cloacae complex NOT DETECTED NOT DETECTED Final    Escherichia coli NOT DETECTED NOT DETECTED Final   Klebsiella aerogenes NOT DETECTED  NOT DETECTED Final   Klebsiella oxytoca NOT DETECTED NOT DETECTED Final   Klebsiella pneumoniae NOT DETECTED NOT DETECTED Final   Proteus species NOT DETECTED NOT DETECTED Final   Salmonella species NOT DETECTED NOT DETECTED Final   Serratia marcescens NOT DETECTED NOT DETECTED Final   Haemophilus influenzae NOT DETECTED NOT DETECTED Final   Neisseria meningitidis NOT DETECTED NOT DETECTED Final   Pseudomonas aeruginosa NOT DETECTED NOT DETECTED Final   Stenotrophomonas maltophilia NOT DETECTED NOT DETECTED Final   Candida albicans NOT DETECTED NOT DETECTED Final   Candida auris NOT DETECTED NOT DETECTED Final   Candida glabrata NOT DETECTED NOT DETECTED Final   Candida krusei NOT DETECTED NOT DETECTED Final   Candida parapsilosis NOT DETECTED NOT DETECTED Final   Candida tropicalis NOT DETECTED NOT DETECTED Final   Cryptococcus neoformans/gattii NOT DETECTED NOT DETECTED Final   Meth resistant mecA/C and MREJ NOT DETECTED NOT DETECTED Final    Comment: Performed at [facility], [place] [address], [place], [place] [address]  Blood Culture (routine x 2)     Status: Abnormal   Collection Time: 01/24/22  9:28 AM   Specimen: BLOOD  Result Value Ref Range Status   Specimen Description BLOOD SITE NOT SPECIFIED  Final   Special Requests   Final    BOTTLES DRAWN AEROBIC AND ANAEROBIC Blood Culture results may not be optimal due to an excessive volume of blood received in culture bottles   Culture  Setup Time   Final    GRAM POSITIVE COCCI IN PAIRS IN BOTH AEROBIC AND ANAEROBIC BOTTLES CRITICAL VALUE NOTED.  VALUE IS CONSISTENT WITH PREVIOUSLY REPORTED AND CALLED VALUE.    Culture (A)  Final    STAPHYLOCOCCUS AUREUS SUSCEPTIBILITIES PERFORMED ON PREVIOUS CULTURE WITHIN THE LAST 5 DAYS. Performed at [facility], [place] [address], [place], [place] [address]    Report Status 01/26/2022 FINAL  Final   Gastrointestinal Panel by PCR , Stool     Status: None   Collection Time: 01/24/22 10:36 AM   Specimen: Stool  Result Value Ref Range Status   Campylobacter species NOT DETECTED NOT DETECTED Final   Plesimonas shigelloides NOT DETECTED NOT DETECTED Final   Salmonella species NOT DETECTED NOT DETECTED Final   Yersinia enterocolitica NOT DETECTED NOT DETECTED Final   Vibrio species NOT DETECTED NOT DETECTED Final   Vibrio cholerae NOT DETECTED NOT DETECTED Final   Enteroaggregative E coli (EAEC) NOT DETECTED NOT DETECTED Final   Enteropathogenic E coli (EPEC) NOT DETECTED NOT DETECTED Final   Enterotoxigenic E coli (ETEC) NOT DETECTED NOT DETECTED Final   Shiga like toxin producing E coli (STEC) NOT DETECTED NOT DETECTED Final   Shigella/Enteroinvasive E coli (EIEC) NOT DETECTED NOT DETECTED Final   Cryptosporidium NOT DETECTED NOT DETECTED Final   Cyclospora cayetanensis NOT DETECTED NOT DETECTED Final   Entamoeba histolytica NOT DETECTED NOT DETECTED Final   Giardia lamblia NOT DETECTED NOT DETECTED Final   Adenovirus F40/41 NOT DETECTED NOT DETECTED Final   Astrovirus NOT DETECTED NOT DETECTED Final   Norovirus GI/GII NOT DETECTED NOT DETECTED Final   Rotavirus A NOT DETECTED NOT DETECTED Final   Sapovirus (I, II, IV, and V) NOT DETECTED NOT DETECTED Final    Comment: Performed at [facility], [place] [place], [place] [address]  MRSA Next Gen by PCR, Nasal     Status: None   Collection Time: 01/24/22 11:00 PM   Specimen: Nasal Mucosa; Nasal Swab  Result  Value Ref Range Status   MRSA by PCR Next Gen NOT DETECTED NOT DETECTED Final    Comment: (NOTE) The GeneXpert MRSA Assay (FDA approved for NASAL specimens only), is one component of a comprehensive MRSA colonization surveillance program. It is not intended to diagnose MRSA infection nor to guide or monitor treatment for MRSA infections. Test performance is not FDA approved in patients less than 72  years old. Performed at [facility], [place] [address], [place], [place] [address]   C Difficile Quick Screen w PCR reflex     Status: None   Collection Time: 01/25/22  5:14 AM   Specimen: STOOL  Result Value Ref Range Status   C Diff antigen NEGATIVE NEGATIVE Final   C Diff toxin NEGATIVE NEGATIVE Final   C Diff interpretation No C. difficile detected.  Final    Comment: Performed at [facility], [place] [address], [place], [place] [address]  Culture, blood (Routine X 2) w Reflex to ID Panel     Status: None (Preliminary result)   Collection Time: 01/25/22  2:56 PM   Specimen: BLOOD  Result Value Ref Range Status   Specimen Description BLOOD SITE NOT SPECIFIED  Final   Special Requests   Final    BOTTLES DRAWN AEROBIC AND ANAEROBIC Blood Culture adequate volume   Culture   Final    NO GROWTH 3 DAYS Performed at [facility], [address], [place], [place] [address]    Report Status PENDING  Incomplete  Culture, blood (Routine X 2) w Reflex to ID Panel     Status: None (Preliminary result)   Collection Time: 01/25/22  2:56 PM   Specimen: BLOOD  Result Value Ref Range Status   Specimen Description BLOOD SITE NOT SPECIFIED  Final   Special Requests   Final    BOTTLES DRAWN AEROBIC AND ANAEROBIC Blood Culture adequate volume   Culture   Final    NO GROWTH 3 DAYS Performed at [facility], [address], [place], [place] [address]    Report Status PENDING  Incomplete     Radiology Studies: No results found.   Scheduled Meds:  Chlorhexidine Gluconate Cloth  6 each Topical Daily   insulin aspart  0-9 Units Subcutaneous Q4H   lactose free nutrition  237 mL Oral BID BM   lidocaine  1 patch Transdermal Q24H   multivitamin  1 tablet Oral QHS   mouth rinse  15 mL Mouth Rinse 4 times per day   pantoprazole  40 mg Oral Daily   potassium chloride  40 mEq Oral TID   predniSONE  5 mg Oral Q breakfast   saccharomyces boulardii  250 mg Oral BID   sodium  chloride flush  3 mL Intravenous Q12H   Continuous Infusions:  sodium chloride 20 mL/hr at 01/28/22 0528    ceFAZolin (ANCEF) IV 2 g (01/28/22 0943)     LOS: 4 days    Time spent: 32mn  PDomenic Polite MD [facility]   01/28/2022, 11:27 AM

## 2022-01-28 NOTE — Progress Notes (Signed)
Rounding Note    Patient Name: Shelby Walker Date of Encounter: 01/28/2022  [place] Cardiologist: Mertie Moores, MD   Subjective   Feeling fatigued but otherwise no acute complaints  Inpatient Medications    Scheduled Meds:  Chlorhexidine Gluconate Cloth  6 each Topical Daily   insulin aspart  0-9 Units Subcutaneous Q4H   lactose free nutrition  237 mL Oral BID BM   lidocaine  1 patch Transdermal Q24H   multivitamin  1 tablet Oral QHS   mouth rinse  15 mL Mouth Rinse 4 times per day   pantoprazole  40 mg Oral Daily   potassium chloride  40 mEq Oral TID   predniSONE  5 mg Oral Q breakfast   saccharomyces boulardii  250 mg Oral BID   sodium chloride flush  3 mL Intravenous Q12H   Continuous Infusions:  sodium chloride 20 mL/hr at 01/28/22 0528    ceFAZolin (ANCEF) IV Stopped (01/27/22 2027)   PRN Meds: acetaminophen, docusate sodium, fentaNYL (SUBLIMAZE) injection, hydrALAZINE, ondansetron (ZOFRAN) IV, mouth rinse, polyethylene glycol   Vital Signs    Vitals:   01/28/22 0009 01/28/22 0010 01/28/22 0339 01/28/22 0742  BP: (!) 163/78 (!) 168/71 (!) 150/99 (!) 174/70  Pulse: 80 76 71 70  Resp: '16  16 20  '$ Temp: 98.4 F (36.9 C)  98.1 F (36.7 C)   TempSrc: Oral  Oral   SpO2: 98%  98% 98%  Weight:   57.2 kg   Height:        Intake/Output Summary (Last 24 hours) at 01/28/2022 0825 Last data filed at 01/28/2022 0756 Gross per 24 hour  Intake 389.67 ml  Output 3365 ml  Net -2975.33 ml      01/28/2022    3:39 AM 01/26/2022    4:22 AM 01/25/2022    5:00 AM  Last 3 Weights  Weight (lbs) 126 lb 1.7 oz 118 lb 2.7 oz 118 lb 2.7 oz  Weight (kg) 57.2 kg 53.6 kg 53.6 kg      Telemetry    Sinus rhythm, rare atrial pacing- Personally Reviewed  ECG    None new- Personally Reviewed  Physical Exam   GEN: No acute distress.   Neck: No JVD Cardiac: RRR, no murmurs, rubs, or gallops.  Respiratory: Clear to auscultation bilaterally. GI: Soft,  nontender, non-distended  MS: No edema; No deformity. Neuro:  Nonfocal  Psych: Normal affect   Labs    High Sensitivity Troponin:   Recent Labs  Lab 01/24/22 0935 01/25/22 1021 01/25/22 1453 01/25/22 1655  TROPONINIHS 42* 1,059* 1,789* 1,991*     Chemistry Recent Labs  Lab 01/25/22 0514 01/25/22 1021 01/25/22 1449 01/26/22 0522 01/27/22 0507 01/28/22 0117  NA 137 135   < > 142 135 144  K 4.8 4.8   < > 3.7 3.2* 2.5*  CL 105 103   < > 103 104 106  CO2 20* 18*   < > 21* 21* 26  GLUCOSE 188* 183*   < > 151* 124* 210*  BUN 51* 50*   < > 54* 52* 45*  CREATININE 2.67* 2.53*   < > 2.26* 1.87* 1.61*  CALCIUM 7.3* 7.2*   < > 8.1* 7.2* 7.5*  MG 1.7 1.6*  --  2.9*  --   --   PROT  --  4.6*  --   --  5.3* 4.9*  ALBUMIN  --  2.4*  --   --  2.4* 2.3*  AST  --  250*  --   --  212* 113*  ALT  --  155*  --   --  116* 68*  ALKPHOS  --  61  --   --  79 68  BILITOT  --  0.5  --   --  0.7 0.8  GFRNONAA 17* 18*   < > 21* 26* 31*  ANIONGAP 12 14   < > 18* 10 12   < > = values in this interval not displayed.    Lipids No results for input(s): "CHOL", "TRIG", "HDL", "LABVLDL", "LDLCALC", "CHOLHDL" in the last 168 hours.  Hematology Recent Labs  Lab 01/26/22 0522 01/27/22 0507 01/28/22 0117  WBC 16.0* 14.5* 10.3  RBC 2.69* 2.62* 2.64*  HGB 8.0* 7.8* 7.9*  HCT 24.3* 23.7* 23.5*  MCV 90.3 90.5 89.0  MCH 29.7 29.8 29.9  MCHC 32.9 32.9 33.6  RDW 15.9* 15.7* 15.3  PLT 108* 91* 93*   Thyroid No results for input(s): "TSH", "FREET4" in the last 168 hours.  BNP Recent Labs  Lab 01/24/22 0935 01/25/22 0934  BNP 1,004.5* 2,919.6*    DDimer  Recent Labs  Lab 01/25/22 0936  DDIMER 13.22*     Radiology    No results found.  Cardiac Studies   TTE 01/25/22  1. Left ventricular ejection fraction, by estimation, is 50 to 55%. The  left ventricle has low normal function. The left ventricle has no regional  wall motion abnormalities. Left ventricular diastolic parameters are   consistent with Grade II diastolic  dysfunction (pseudonormalization).   2. Right ventricular systolic function is normal. The right ventricular  size is normal. There is normal pulmonary artery systolic pressure. The  estimated right ventricular systolic pressure is 57.8 mmHg.   3. Left atrial size was moderately dilated.   4. The mitral valve is degenerative. Mild mitral valve regurgitation. The  mean mitral valve gradient is 4.0 mmHg with average heart rate of 77 bpm.   5. The aortic valve is tricuspid. There is mild calcification of the  aortic valve. There is mild thickening of the aortic valve. Aortic valve  regurgitation is not visualized. Aortic valve sclerosis is present, with  no evidence of aortic valve stenosis.   6. The inferior vena cava is dilated in size with <50% respiratory  variability, suggesting right atrial pressure of 15 mmHg.   Patient Profile     88 y.o. female history of coronary artery disease, type 2 diabetes, hyperlipidemia, atrial fibrillation, rheumatoid arthritis, pacemaker with MSSA bacteremia.  Assessment & Plan    1.  MSSA bacteremia 2.  CIED infection  Plan for pacemaker removal likely tomorrow.  Patient was in DIC with low platelets, though this has been improving.  We Enoc Getter make her n.p.o. after midnight tonight.  Plan for TEE tomorrow to rule out endocarditis.     For questions or updates, please contact [place] Please consult [email or web address] for contact info under        Signed, Venetia Prewitt Meredith Leeds, MD  01/28/2022, 8:25 AM

## 2022-01-28 NOTE — Progress Notes (Signed)
PT Cancellation Note  Patient Details Name: Shelby Walker MRN: [id number] DOB: 11-04-1933   Cancelled Treatment:    Reason Eval/Treat Not Completed: (P) Pain limiting ability to participate RN reports that pt was up to chair this morning and has had excruciating pain since then. Pt pain finally under control and pt is resting. PT to follow back for Eval tomorrow.   Jackson Fetters B. Migdalia Dk PT, DPT Acute Rehabilitation Services Please use secure chat or  Call Office [phone]    [place] 01/28/2022, 1:45 PM

## 2022-01-29 ENCOUNTER — Ambulatory Visit: Payer: Medicare Other | Admitting: Physician Assistant

## 2022-01-29 ENCOUNTER — Inpatient Hospital Stay (HOSPITAL_COMMUNITY): Payer: Medicare Other

## 2022-01-29 ENCOUNTER — Encounter (HOSPITAL_COMMUNITY)
Admission: EM | Disposition: A | Discharge status: Rehabilitation Facility | Payer: Self-pay | Source: Home / Self Care | Attending: Internal Medicine

## 2022-01-29 ENCOUNTER — Inpatient Hospital Stay (HOSPITAL_COMMUNITY): Payer: Medicare Other | Admitting: Certified Registered Nurse Anesthetist

## 2022-01-29 ENCOUNTER — Encounter (HOSPITAL_COMMUNITY): Payer: Self-pay | Admitting: Pulmonary Disease

## 2022-01-29 DIAGNOSIS — D649 Anemia, unspecified: Secondary | ICD-10-CM | POA: Diagnosis not present

## 2022-01-29 DIAGNOSIS — R6521 Severe sepsis with septic shock: Secondary | ICD-10-CM | POA: Diagnosis not present

## 2022-01-29 DIAGNOSIS — I340 Nonrheumatic mitral (valve) insufficiency: Secondary | ICD-10-CM

## 2022-01-29 DIAGNOSIS — R7881 Bacteremia: Secondary | ICD-10-CM

## 2022-01-29 DIAGNOSIS — I38 Endocarditis, valve unspecified: Secondary | ICD-10-CM

## 2022-01-29 DIAGNOSIS — E1151 Type 2 diabetes mellitus with diabetic peripheral angiopathy without gangrene: Secondary | ICD-10-CM

## 2022-01-29 DIAGNOSIS — T827XXA Infection and inflammatory reaction due to other cardiac and vascular devices, implants and grafts, initial encounter: Secondary | ICD-10-CM | POA: Diagnosis not present

## 2022-01-29 DIAGNOSIS — A419 Sepsis, unspecified organism: Secondary | ICD-10-CM | POA: Diagnosis not present

## 2022-01-29 DIAGNOSIS — Z794 Long term (current) use of insulin: Secondary | ICD-10-CM

## 2022-01-29 HISTORY — PX: PPM GENERATOR REMOVAL: EP1234

## 2022-01-29 HISTORY — PX: TEE WITHOUT CARDIOVERSION: SHX5443

## 2022-01-29 LAB — COMPREHENSIVE METABOLIC PANEL
ALT: 30 U/L (ref 0–44)
AST: 57 U/L — ABNORMAL HIGH (ref 15–41)
Albumin: 2.5 g/dL — ABNORMAL LOW (ref 3.5–5.0)
Alkaline Phosphatase: 77 U/L (ref 38–126)
Anion gap: 10 (ref 5–15)
BUN: 44 mg/dL — ABNORMAL HIGH (ref 8–23)
CO2: 28 mmol/L (ref 22–32)
Calcium: 7.5 mg/dL — ABNORMAL LOW (ref 8.9–10.3)
Chloride: 105 mmol/L (ref 98–111)
Creatinine, Ser: 1.47 mg/dL — ABNORMAL HIGH (ref 0.44–1.00)
GFR, Estimated: 35 mL/min — ABNORMAL LOW (ref >60)
Glucose, Bld: 192 mg/dL — ABNORMAL HIGH (ref 70–99)
Potassium: 3.5 mmol/L (ref 3.5–5.1)
Sodium: 143 mmol/L (ref 135–145)
Total Bilirubin: 0.4 mg/dL (ref 0.3–1.2)
Total Protein: 5.3 g/dL — ABNORMAL LOW (ref 6.5–8.1)

## 2022-01-29 LAB — GLUCOSE, CAPILLARY
Glucose-Capillary: 135 mg/dL — ABNORMAL HIGH (ref 70–99)
Glucose-Capillary: 138 mg/dL — ABNORMAL HIGH (ref 70–99)
Glucose-Capillary: 141 mg/dL — ABNORMAL HIGH (ref 70–99)
Glucose-Capillary: 153 mg/dL — ABNORMAL HIGH (ref 70–99)
Glucose-Capillary: 154 mg/dL — ABNORMAL HIGH (ref 70–99)
Glucose-Capillary: 202 mg/dL — ABNORMAL HIGH (ref 70–99)
Glucose-Capillary: 239 mg/dL — ABNORMAL HIGH (ref 70–99)
Glucose-Capillary: 281 mg/dL — ABNORMAL HIGH (ref 70–99)

## 2022-01-29 LAB — CBC
HCT: 28.6 % — ABNORMAL LOW (ref 36.0–46.0)
Hemoglobin: 9.4 g/dL — ABNORMAL LOW (ref 12.0–15.0)
MCH: 29.6 pg (ref 26.0–34.0)
MCHC: 32.9 g/dL (ref 30.0–36.0)
MCV: 89.9 fL (ref 80.0–100.0)
Platelets: 133 10*3/uL — ABNORMAL LOW (ref 150–400)
RBC: 3.18 MIL/uL — ABNORMAL LOW (ref 3.87–5.11)
RDW: 15.5 % (ref 11.5–15.5)
WBC: 11.2 10*3/uL — ABNORMAL HIGH (ref 4.0–10.5)
nRBC: 0 % (ref 0.0–0.2)

## 2022-01-29 SURGERY — PPM GENERATOR REMOVAL
Anesthesia: LOCAL

## 2022-01-29 SURGERY — ECHOCARDIOGRAM, TRANSESOPHAGEAL
Anesthesia: Monitor Anesthesia Care

## 2022-01-29 MED ORDER — FENTANYL CITRATE (PF) 100 MCG/2ML IJ SOLN
INTRAMUSCULAR | Status: AC
  Filled 2022-01-29: qty 2

## 2022-01-29 MED ORDER — CHLORHEXIDINE GLUCONATE 4 % EX LIQD
60.0000 mL | Freq: Once | CUTANEOUS | Status: AC
  Administered 2022-01-29: 4 via TOPICAL
  Filled 2022-01-29: qty 60

## 2022-01-29 MED ORDER — SODIUM CHLORIDE 0.9 % IV SOLN
80.0000 mg | INTRAVENOUS | Status: AC
  Filled 2022-01-29: qty 2

## 2022-01-29 MED ORDER — ONDANSETRON HCL 4 MG/2ML IJ SOLN
INTRAMUSCULAR | Status: AC
  Filled 2022-01-29: qty 2

## 2022-01-29 MED ORDER — MIDAZOLAM HCL 5 MG/5ML IJ SOLN
INTRAMUSCULAR | Status: AC
  Filled 2022-01-29: qty 5

## 2022-01-29 MED ORDER — PROPOFOL 10 MG/ML IV BOLUS
INTRAVENOUS | Status: DC | PRN
  Administered 2022-01-29: 100 ug/kg/min via INTRAVENOUS

## 2022-01-29 MED ORDER — LACTATED RINGERS IV SOLN: INTRAVENOUS | Status: DC | PRN

## 2022-01-29 MED ORDER — LIDOCAINE HCL (PF) 1 % IJ SOLN
INTRAMUSCULAR | Status: DC | PRN
  Administered 2022-01-29: 40 mL via SUBCUTANEOUS

## 2022-01-29 MED ORDER — CEFAZOLIN SODIUM-DEXTROSE 2-3 GM-%(50ML) IV SOLR
INTRAVENOUS | Status: AC | PRN
  Administered 2022-01-29: 2 g via INTRAVENOUS

## 2022-01-29 MED ORDER — SODIUM CHLORIDE 0.9% FLUSH: 3.0000 mL | INTRAVENOUS | Status: DC | PRN

## 2022-01-29 MED ORDER — SODIUM CHLORIDE 0.9 % IV SOLN
250.0000 mL | INTRAVENOUS | Status: DC
  Administered 2022-01-29: 250 mL via INTRAVENOUS

## 2022-01-29 MED ORDER — AMIODARONE HCL IN DEXTROSE 360-4.14 MG/200ML-% IV SOLN
30.0000 mg/h | INTRAVENOUS | Status: AC
  Administered 2022-01-29: 30 mg/h via INTRAVENOUS
  Filled 2022-01-29: qty 200

## 2022-01-29 MED ORDER — CEFAZOLIN SODIUM-DEXTROSE 2-4 GM/100ML-% IV SOLN
INTRAVENOUS | Status: AC
  Filled 2022-01-29: qty 100

## 2022-01-29 MED ORDER — POTASSIUM CHLORIDE 20 MEQ PO PACK
40.0000 meq | PACK | Freq: Two times a day (BID) | ORAL | Status: AC
  Administered 2022-01-29: 40 meq via ORAL
  Filled 2022-01-29: qty 2

## 2022-01-29 MED ORDER — SODIUM CHLORIDE 0.9 % IV SOLN: INTRAVENOUS | Status: DC

## 2022-01-29 MED ORDER — AMIODARONE HCL IN DEXTROSE 360-4.14 MG/200ML-% IV SOLN
60.0000 mg/h | INTRAVENOUS | Status: AC
  Administered 2022-01-29: 60 mg/h via INTRAVENOUS
  Filled 2022-01-29: qty 200

## 2022-01-29 MED ORDER — SODIUM CHLORIDE 0.9 % IV SOLN
INTRAVENOUS | Status: AC
  Filled 2022-01-29: qty 2

## 2022-01-29 MED ORDER — AMIODARONE IV BOLUS ONLY 150 MG/100ML
INTRAVENOUS | Status: AC
  Administered 2022-01-29: 60 mg
  Filled 2022-01-29: qty 100

## 2022-01-29 MED ORDER — ONDANSETRON HCL 4 MG/2ML IJ SOLN
INTRAMUSCULAR | Status: DC | PRN
  Administered 2022-01-29: 2 mg via INTRAVENOUS

## 2022-01-29 MED ORDER — AMIODARONE LOAD VIA INFUSION
150.0000 mg | Freq: Once | INTRAVENOUS | Status: AC
  Administered 2022-01-29: 150 mg via INTRAVENOUS
  Filled 2022-01-29: qty 83.34

## 2022-01-29 MED ORDER — SODIUM CHLORIDE 0.9 % IV SOLN
INTRAVENOUS | Status: DC | PRN
  Administered 2022-01-29: 1000 mg

## 2022-01-29 MED ORDER — ONDANSETRON HCL 4 MG/2ML IJ SOLN
4.0000 mg | Freq: Four times a day (QID) | INTRAMUSCULAR | Status: DC | PRN
  Administered 2022-01-30 – 2022-02-01 (×8): 4 mg via INTRAVENOUS
  Filled 2022-01-29 (×8): qty 2

## 2022-01-29 MED ORDER — CEFAZOLIN SODIUM-DEXTROSE 2-4 GM/100ML-% IV SOLN: 2.0000 g | INTRAVENOUS | Status: AC

## 2022-01-29 MED ORDER — ACETAMINOPHEN 325 MG PO TABS
325.0000 mg | ORAL_TABLET | ORAL | Status: DC | PRN
  Administered 2022-01-29 – 2022-02-02 (×4): 650 mg via ORAL
  Filled 2022-01-29 (×4): qty 2

## 2022-01-29 SURGICAL SUPPLY — 4 items
CABLE SURGICAL S-101-97-12 (CABLE) ×2 IMPLANT
KIT LEAD ACCESSORY 6056M PINCH (KITS) IMPLANT
PAD DEFIB RADIO PHYSIO CONN (PAD) ×2 IMPLANT
TRAY PACEMAKER INSERTION (PACKS) ×2 IMPLANT

## 2022-01-29 NOTE — CV Procedure (Signed)
    TRANSESOPHAGEAL ECHOCARDIOGRAM   NAME:  Shelby Walker    MRN: [id number] DOB:  23-May-1933    ADMIT DATE: 01/24/2022  INDICATIONS: Bacteremia  PROCEDURE:   Informed consent was obtained prior to the procedure. The risks, benefits and alternatives for the procedure were discussed and the patient comprehended these risks.  Risks include, but are not limited to, cough, sore throat, vomiting, nausea, somnolence, esophageal and stomach trauma or perforation, bleeding, low blood pressure, aspiration, pneumonia, infection, trauma to the teeth and death.    Procedural time out performed. The oropharynx was anesthetized with topical 1% benzocaine.    Anesthesia was administered by Dr. Lanetta Inch.  The patient was administered 107 mg of propofol and 0 mg of lidocaine to achieve and maintain moderate conscious sedation.  The patient's heart rate, blood pressure, and oxygen saturation are monitored continuously during the procedure. The period of conscious sedation is 14 minutes, of which I was present face-to-face 100% of this time.   The transesophageal probe was inserted in the esophagus and stomach without difficulty and multiple views were obtained.   COMPLICATIONS:    There were no immediate complications.  KEY FINDINGS:  No endocarditis detected.  Normal LV/RV function.  Full report to follow. Further management per primary team.   [place] B[place] T. Audie Box, MD, [place]  [address], [place], [place] [address] [phone]  10:51 AM

## 2022-01-29 NOTE — Progress Notes (Signed)
Patient HR in the 120s-130s jumping back and forth between Irregular HR and Afib. This RN paged EP PA and they stated they will notify MD. This RN will continue to monitor.  [place] C Inari Shin

## 2022-01-29 NOTE — H&P (View-Only) (Signed)
Electrophysiology Rounding Note  Patient Name: Shelby Walker Date of Encounter: 01/29/2022  Primary Cardiologist: Mertie Moores, MD Electrophysiologist: Virl Axe, MD   Subjective   The patient is doing well today.  At this time, the patient denies chest pain, shortness of breath, or any new concerns.  Inpatient Medications    Scheduled Meds:  Chlorhexidine Gluconate Cloth  6 each Topical Daily   gentamicin (GARAMYCIN) 80 mg in sodium chloride 0.9 % 500 mL irrigation  80 mg Irrigation On Call   insulin aspart  0-9 Units Subcutaneous Q4H   lactose free nutrition  237 mL Oral BID BM   lidocaine  1 patch Transdermal Q24H   multivitamin  1 tablet Oral QHS   mouth rinse  15 mL Mouth Rinse 4 times per day   pantoprazole  40 mg Oral Daily   potassium chloride  40 mEq Oral BID   predniSONE  5 mg Oral Q breakfast   saccharomyces boulardii  250 mg Oral BID   sodium chloride flush  3 mL Intravenous Q12H   Continuous Infusions:  sodium chloride 20 mL/hr at 01/29/22 0457   sodium chloride     sodium chloride      ceFAZolin (ANCEF) IV 2 g (01/28/22 2257)    ceFAZolin (ANCEF) IV     PRN Meds: acetaminophen, docusate sodium, hydrALAZINE, HYDROmorphone (DILAUDID) injection, ondansetron (ZOFRAN) IV, mouth rinse, polyethylene glycol, sodium chloride flush   Vital Signs    Vitals:   01/28/22 1530 01/28/22 2024 01/28/22 2327 01/29/22 0444  BP: (!) 157/70 (!) 152/67 (!) 155/90 (!) 156/76  Pulse: 69 65 78 74  Resp: '18 17 18 18  '$ Temp: 97.7 F (36.5 C) 97.6 F (36.4 C) 97.7 F (36.5 C) 97.8 F (36.6 C)  TempSrc: Oral Oral Oral Oral  SpO2: 97% 97% 97% 98%  Weight:    57.4 kg  Height:        Intake/Output Summary (Last 24 hours) at 01/29/2022 0718 Last data filed at 01/29/2022 0457 Gross per 24 hour  Intake 569.67 ml  Output 3500 ml  Net -2930.33 ml   Filed Weights   01/26/22 0422 01/28/22 0339 01/29/22 0444  Weight: 53.6 kg 57.2 kg 57.4 kg    Physical Exam    GEN-  The patient is well appearing, alert and oriented x 3 today.   Head- normocephalic, atraumatic Eyes-  Sclera clear, conjunctiva pink Ears- hearing intact Oropharynx- clear Neck- supple Lungs- Clear to ausculation bilaterally, normal work of breathing Heart- Regular rate and rhythm, no murmurs, rubs or gallops GI- soft, NT, ND, + BS Extremities- no clubbing or cyanosis. No edema Skin- no rash or lesion Psych- euthymic mood, full affect Neuro- strength and sensation are intact  Labs    CBC Recent Labs    01/28/22 0117 01/29/22 0136  WBC 10.3 11.2*  HGB 7.9* 9.4*  HCT 23.5* 28.6*  MCV 89.0 89.9  PLT 93* 627*   Basic Metabolic Panel Recent Labs    01/28/22 0117 01/29/22 0136  NA 144 143  K 2.5* 3.5  CL 106 105  CO2 26 28  GLUCOSE 210* 192*  BUN 45* 44*  CREATININE 1.61* 1.47*  CALCIUM 7.5* 7.5*   Liver Function Tests Recent Labs    01/28/22 0117 01/29/22 0136  AST 113* 57*  ALT 68* 30  ALKPHOS 68 77  BILITOT 0.8 0.4  PROT 4.9* 5.3*  ALBUMIN 2.3* 2.5*   No results for input(s): "LIPASE", "AMYLASE" in the last 72 hours.  Cardiac Enzymes No results for input(s): "CKTOTAL", "CKMB", "CKMBINDEX", "TROPONINI" in the last 72 hours.   Telemetry    NSR 60-70s, rare a pacing (personally reviewed)  Radiology    No results found.  Patient Profile     88 y.o. female history of coronary artery disease, type 2 diabetes, hyperlipidemia, atrial fibrillation, rheumatoid arthritis, pacemaker with MSSA bacteremia.   Assessment & Plan    MSSA Bacteremia CIED infection Tentatively on for Explant with Dr. Lovena Le today. Leave NPO Explained risks, benefits, and alternatives to PPM extraction, including but not limited to bleeding, infection, pneumothorax, pericardial effusion, heart attack, stroke, or death.  Pt verbalized understanding and agrees to proceed.  3. Sinus pauses Pt had a pause > 20 seconds on loop recorder in setting of COVID.  Will discuss disposition  with MD. (Watchful waiting vs Temp-Perm)  4. Thrombocytopenia 5. DIC PLT  133* (11/27 0136) (93 yesterday) HGB  9.4* (11/27 0136) (7.9 yesterday) In the setting of septic shock, bacteremia   For questions or updates, please contact [place] Please consult [email or web address] for contact info under Cardiology/STEMI.  Signed, Shirley Friar, PA-C  01/29/2022, 7:18 AM   EP Attending  Patient seen and examined. Agree with above. The patient is improved. She is pending a TEE to rule out abscess, lead veg, and to help with duration of therapy. That she has Staph sepsis, we will need to remove her transvenous PPM as it is almost certainly infected/contaminated. He history is notable for a long pause documented on cardiac monitoring but has been asymptomatic. We will follow her. If no veg or abscess and she has more pauses, could consider placement of a leadless pm in the future.   Carleene Overlie Unice Vantassel,MD

## 2022-01-29 NOTE — Progress Notes (Addendum)
Electrophysiology Rounding Note  Patient Name: Shelby Walker Date of Encounter: 01/29/2022  Primary Cardiologist: Mertie Moores, MD Electrophysiologist: Virl Axe, MD   Subjective   The patient is doing well today.  At this time, the patient denies chest pain, shortness of breath, or any new concerns.  Inpatient Medications    Scheduled Meds:  Chlorhexidine Gluconate Cloth  6 each Topical Daily   gentamicin (GARAMYCIN) 80 mg in sodium chloride 0.9 % 500 mL irrigation  80 mg Irrigation On Call   insulin aspart  0-9 Units Subcutaneous Q4H   lactose free nutrition  237 mL Oral BID BM   lidocaine  1 patch Transdermal Q24H   multivitamin  1 tablet Oral QHS   mouth rinse  15 mL Mouth Rinse 4 times per day   pantoprazole  40 mg Oral Daily   potassium chloride  40 mEq Oral BID   predniSONE  5 mg Oral Q breakfast   saccharomyces boulardii  250 mg Oral BID   sodium chloride flush  3 mL Intravenous Q12H   Continuous Infusions:  sodium chloride 20 mL/hr at 01/29/22 0457   sodium chloride     sodium chloride      ceFAZolin (ANCEF) IV 2 g (01/28/22 2257)    ceFAZolin (ANCEF) IV     PRN Meds: acetaminophen, docusate sodium, hydrALAZINE, HYDROmorphone (DILAUDID) injection, ondansetron (ZOFRAN) IV, mouth rinse, polyethylene glycol, sodium chloride flush   Vital Signs    Vitals:   01/28/22 1530 01/28/22 2024 01/28/22 2327 01/29/22 0444  BP: (!) 157/70 (!) 152/67 (!) 155/90 (!) 156/76  Pulse: 69 65 78 74  Resp: '18 17 18 18  '$ Temp: 97.7 F (36.5 C) 97.6 F (36.4 C) 97.7 F (36.5 C) 97.8 F (36.6 C)  TempSrc: Oral Oral Oral Oral  SpO2: 97% 97% 97% 98%  Weight:    57.4 kg  Height:        Intake/Output Summary (Last 24 hours) at 01/29/2022 0718 Last data filed at 01/29/2022 0457 Gross per 24 hour  Intake 569.67 ml  Output 3500 ml  Net -2930.33 ml   Filed Weights   01/26/22 0422 01/28/22 0339 01/29/22 0444  Weight: 53.6 kg 57.2 kg 57.4 kg    Physical Exam    GEN-  The patient is well appearing, alert and oriented x 3 today.   Head- normocephalic, atraumatic Eyes-  Sclera clear, conjunctiva pink Ears- hearing intact Oropharynx- clear Neck- supple Lungs- Clear to ausculation bilaterally, normal work of breathing Heart- Regular rate and rhythm, no murmurs, rubs or gallops GI- soft, NT, ND, + BS Extremities- no clubbing or cyanosis. No edema Skin- no rash or lesion Psych- euthymic mood, full affect Neuro- strength and sensation are intact  Labs    CBC Recent Labs    01/28/22 0117 01/29/22 0136  WBC 10.3 11.2*  HGB 7.9* 9.4*  HCT 23.5* 28.6*  MCV 89.0 89.9  PLT 93* 621*   Basic Metabolic Panel Recent Labs    01/28/22 0117 01/29/22 0136  NA 144 143  K 2.5* 3.5  CL 106 105  CO2 26 28  GLUCOSE 210* 192*  BUN 45* 44*  CREATININE 1.61* 1.47*  CALCIUM 7.5* 7.5*   Liver Function Tests Recent Labs    01/28/22 0117 01/29/22 0136  AST 113* 57*  ALT 68* 30  ALKPHOS 68 77  BILITOT 0.8 0.4  PROT 4.9* 5.3*  ALBUMIN 2.3* 2.5*   No results for input(s): "LIPASE", "AMYLASE" in the last 72 hours.  Cardiac Enzymes No results for input(s): "CKTOTAL", "CKMB", "CKMBINDEX", "TROPONINI" in the last 72 hours.   Telemetry    NSR 60-70s, rare a pacing (personally reviewed)  Radiology    No results found.  Patient Profile     88 y.o. female history of coronary artery disease, type 2 diabetes, hyperlipidemia, atrial fibrillation, rheumatoid arthritis, pacemaker with MSSA bacteremia.   Assessment & Plan    MSSA Bacteremia CIED infection Tentatively on for Explant with Dr. Lovena Le today. Leave NPO Explained risks, benefits, and alternatives to PPM extraction, including but not limited to bleeding, infection, pneumothorax, pericardial effusion, heart attack, stroke, or death.  Pt verbalized understanding and agrees to proceed.  3. Sinus pauses Pt had a pause > 20 seconds on loop recorder in setting of COVID.  Will discuss disposition  with MD. (Watchful waiting vs Temp-Perm)  4. Thrombocytopenia 5. DIC PLT  133* (11/27 0136) (93 yesterday) HGB  9.4* (11/27 0136) (7.9 yesterday) In the setting of septic shock, bacteremia   For questions or updates, please contact [place] Please consult [email or web address] for contact info under Cardiology/STEMI.  Signed, Shirley Friar, PA-C  01/29/2022, 7:18 AM   EP Attending  Patient seen and examined. Agree with above. The patient is improved. She is pending a TEE to rule out abscess, lead veg, and to help with duration of therapy. That she has Staph sepsis, we will need to remove her transvenous PPM as it is almost certainly infected/contaminated. He history is notable for a long pause documented on cardiac monitoring but has been asymptomatic. We will follow her. If no veg or abscess and she has more pauses, could consider placement of a leadless pm in the future.   Carleene Overlie Crystalyn Delia,MD

## 2022-01-29 NOTE — Progress Notes (Signed)
  Paged for pt now in AF with RVR.   H/o AF, but had quiescent for long period, and data on Loop had suggested an atrial tachycardia.   Reviewed with Dr. Lovena Le.  Given co-morbidities and acute illness, will load with IV amiodarone to help bridge her, and plan further pending disposition.   Add SCDs. No heparin or OAC today with explantation.    Legrand Como [address] [place], [place]  01/29/2022 3:48 PM

## 2022-01-29 NOTE — Interval H&P Note (Signed)
History and Physical Interval Note:  01/29/2022 10:09 AM  Shelby Walker  has presented today for surgery, with the diagnosis of bacteremia.  The various methods of treatment have been discussed with the patient and family. After consideration of risks, benefits and other options for treatment, the patient has consented to  Procedure(s): TRANSESOPHAGEAL ECHOCARDIOGRAM (TEE) (N/A) as a surgical intervention.  The patient's history has been reviewed, patient examined, no change in status, stable for surgery.  I have reviewed the patient's chart and labs.  Questions were answered to the patient's satisfaction.    NPO for TEE for bacteremia.   [place] B[place] T. Audie Box, MD, [place]  [address] [place], [place] [address] [phone]  10:09 AM

## 2022-01-29 NOTE — Anesthesia Postprocedure Evaluation (Signed)
Anesthesia Post Note  Patient: Shelby Walker  Procedure(s) Performed: TRANSESOPHAGEAL ECHOCARDIOGRAM (TEE)     Patient location during evaluation: Endoscopy Anesthesia Type: MAC Level of consciousness: awake and alert Pain management: pain level controlled Vital Signs Assessment: post-procedure vital signs reviewed and stable Respiratory status: spontaneous breathing, nonlabored ventilation, respiratory function stable and patient connected to nasal cannula oxygen Cardiovascular status: blood pressure returned to baseline and stable Postop Assessment: no apparent nausea or vomiting Anesthetic complications: no  No notable events documented.  Last Vitals:  Vitals:   01/29/22 1154 01/29/22 1223  BP: (!) 187/78 (!) 188/79  Pulse: 81 78  Resp: (!) 31 16  Temp:  36.8 C  SpO2: 100% 97%    Last Pain:  Vitals:   01/29/22 1223  TempSrc: Oral  PainSc:                  Anice Wilshire L Clarke [place]

## 2022-01-29 NOTE — Progress Notes (Signed)
Mobility Specialist Progress Note:   01/29/22 0910  Mobility  Activity Off unit   Will follow-up as time allows.   Gareth Eagle Caidence Kaseman Mobility Specialist Please contact via [employer] or  Rehab Office at [phone]

## 2022-01-29 NOTE — Transfer of Care (Signed)
Immediate Anesthesia Transfer of Care Note  Patient: Shelby Walker  Procedure(s) Performed: TRANSESOPHAGEAL ECHOCARDIOGRAM (TEE)  Patient Location: Cath Lab  Anesthesia Type:MAC  Level of Consciousness: awake, alert , patient cooperative, and responds to stimulation  Airway & Oxygen Therapy: Patient Spontanous Breathing and Patient connected to nasal cannula oxygen  Post-op Assessment: Report given to RN and Post -op Vital signs reviewed and stable  Post vital signs: Reviewed and stable  Last Vitals:  Vitals Value Taken Time  BP    Temp    Pulse    Resp    SpO2      Last Pain:  Vitals:   01/29/22 0910  TempSrc: Temporal  PainSc: 4       Patients Stated Pain Goal: 2 (92/42/68 3419)  Complications: No notable events documented.

## 2022-01-29 NOTE — Progress Notes (Signed)
OT Cancellation Note  Patient Details Name: Shelby Walker MRN: [id number] DOB: [birth year]   Cancelled Treatment:    Reason Eval/Treat Not Completed: Patient at procedure or test/ unavailable- will follow up and see as appropriate and able.   Jolaine Artist, OT Acute Rehabilitation Services Office [phone]   Delight Stare 01/29/2022, 9:22 AM

## 2022-01-29 NOTE — Progress Notes (Signed)
PT cancellation:  PT orders received. Pt noted to be off the floor at this time. Will f/u as able & as pt is available.  Lavone Nian, PT, DPT 01/29/22, 10:14 AM

## 2022-01-29 NOTE — Anesthesia Preprocedure Evaluation (Addendum)
Anesthesia Evaluation  Patient identified by MRN, date of birth, ID band Patient awake    Reviewed: Allergy & Precautions, NPO status , Patient's Chart, lab work & pertinent test results, reviewed documented beta blocker date and time   Airway Mallampati: III  TM Distance: >3 FB Neck ROM: Full    Dental no notable dental hx. (+) Edentulous Upper, Edentulous Lower, Dental Advisory Given   Pulmonary neg pulmonary ROS   Pulmonary exam normal breath sounds clear to auscultation       Cardiovascular hypertension, Pt. on home beta blockers and Pt. on medications + angina  + Peripheral Vascular Disease  Normal cardiovascular exam+ pacemaker  Rhythm:Regular Rate:Normal  TTE 2023 1. Left ventricular ejection fraction, by estimation, is 50 to 55%. The  left ventricle has low normal function. The left ventricle has no regional  wall motion abnormalities. Left ventricular diastolic parameters are  consistent with Grade II diastolic  dysfunction (pseudonormalization).   2. Right ventricular systolic function is normal. The right ventricular  size is normal. There is normal pulmonary artery systolic pressure. The  estimated right ventricular systolic pressure is 56.2 mmHg.   3. Left atrial size was moderately dilated.   4. The mitral valve is degenerative. Mild mitral valve regurgitation. The  mean mitral valve gradient is 4.0 mmHg with average heart rate of 77 bpm.   5. The aortic valve is tricuspid. There is mild calcification of the  aortic valve. There is mild thickening of the aortic valve. Aortic valve  regurgitation is not visualized. Aortic valve sclerosis is present, with  no evidence of aortic valve stenosis.   6. The inferior vena cava is dilated in size with <50% respiratory  variability, suggesting right atrial pressure of 15 mmHg.     Neuro/Psych negative neurological ROS  negative psych ROS   GI/Hepatic Neg liver ROS,GERD   ,,  Endo/Other  diabetes, Insulin Dependent    Renal/GU Renal InsufficiencyRenal diseaseLab Results      Component                Value               Date                      CREATININE               1.47 (H)            01/29/2022                BUN                      44 (H)              01/29/2022                NA                       143                 01/29/2022                K                        3.5                 01/29/2022  CL                       105                 01/29/2022                CO2                      28                  01/29/2022             negative genitourinary   Musculoskeletal  (+) Arthritis , Rheumatoid disorders,    Abdominal   Peds  Hematology  (+) Blood dyscrasia, anemia Lab Results      Component                Value               Date                      WBC                      11.2 (H)            01/29/2022                HGB                      9.4 (L)             01/29/2022                HCT                      28.6 (L)            01/29/2022                MCV                      89.9                01/29/2022                PLT                      133 (L)             01/29/2022              Anesthesia Other Findings   Reproductive/Obstetrics                             Anesthesia Physical Anesthesia Plan  ASA: 3  Anesthesia Plan: MAC   Post-op Pain Management:    Induction: Intravenous  PONV Risk Score and Plan: Propofol infusion and Treatment may vary due to age or medical condition  Airway Management Planned: Natural Airway  Additional Equipment:   Intra-op Plan:   Post-operative Plan:   Informed Consent: I have reviewed the patients History and Physical, chart, labs and discussed the procedure including the risks, benefits and alternatives for the proposed anesthesia with the patient or authorized representative who has indicated his/her understanding and  acceptance.   Patient has DNR.  Discussed DNR with power of attorney and Suspend DNR.  Dental advisory given  Plan Discussed with: CRNA  Anesthesia Plan Comments:        Anesthesia Quick Evaluation

## 2022-01-29 NOTE — Progress Notes (Signed)
PROGRESS NOTE    Shelby Walker  VZC:[id number] DOB: [birth year] DOA: 01/24/2022 PCP: Reynold Bowen, MD  88/F with history of recurrent C. difficile colitis, chronic diastolic CHF, mitral regurgitation, CAD, paroxysmal A-fib, diabetes mellitus, psoriasis, rheumatoid arthritis, history of PAD/gangrene and transmetatarsal amputation 11/22, recently hospitalized with prolonged sinus pauses, underwent pacemaker implantation on 01/05/2022 and loop recorder removal -Admitted to the ICU 11/22 with septic shock, found to have MSSA bacteremia -Treated with fluid resuscitation, pressors, antibiotics, infectious disease consulted -Transferred from PCCM to [facility] service 11/24 11/24: EP consulted, plan for PPM removal  Subjective: -Feels good this morning, anxious about PPM removal, no further chest pain  Assessment and Plan:  SEPTIC SHOCK MSSA  bacteremia - poa -on CT abd perinephric stranding likely red herring.  Recent PPM on 11/3 for long sinus pauses, pacemaker site appears unremarkable -continue cefazolin, repeat blood cultures 11/23 negative so far, 2D echo without indication of endocarditis -Stable off pressors -ID following -EP consult appreciated, plan for PPM removal today -Ambulate, PT OT eval  Transient pleuritic chest pain yesterday -Resolved -She had a VQ scan few days ago with was unremarkable, monitor clinically   AKI on CKD2 -baseline creat around 1.2, peaked at 2.6 -in the setting of septic shock, now improving,  Hypokalemia -Replaced  Elevated troponin History of CAD and carotid artery disease -Medical management was recommended as outpatient in the setting of advanced age -Echo with preserved EF, no wall motion abnormalities -Coronary CTA 10/23 noted moderate (50-69%) mixed plaque in the left main and LAD.Can not rule out severe (>70%) splenosis in OM1 -avoid BB w/ recent long sinus pauses and allergic to statin -PCCM discussed with cards 11/23, felt to be demand related  in the setting of septic shock, echo with preserved EF, no WMA, no role for Anticoagulation/LHC with DIC, AKI, supportive care recommended, unclear why troponins were checked  Thrombocytopenia DIC -In the setting of septic shock, bacteremia -Antibiotics as noted above -Improving  Paroxysmal atrial fibrillation -  -Per EP notes few weeks ago no A-fib seen on ILR, not on anticoagulation  Chronic diastolic CHF -Appears mildly volume overloaded, IV Lasix x 1, echo with preserved EF   Rheumatoid arthritis steroid-dependent - Dr Amil Amen  -she was started on stress dose steroids in the ICU, resumed prednisone, on 4 Mg daily at baseline  H/o recurrent Cdiff -now on Abx as above -Denies diarrhea at this time, continue floraster    Chronic anemia - hgb 8-9gm% -At baseline, monitor    Chronic pain from RA-  -At baseline on gabapentin, tramadol, Tylenol 3  -Continue tramadol, gabapentin at lower dose   Vomiting - 01/24/22  -Noted while in ICU, abdominal exam is benign, no further episodes, monitor clinically   Diabetes mellitus with complications -Sliding scale insulin protocol   DVT prophylaxis: SCDs,  Code Status: DNR Family Communication: No family at bedside today Disposition Plan: May need rehab, PT eval pending  Consultants: ID, EP   Procedures:   Antimicrobials:    Objective: Vitals:   01/28/22 2327 01/29/22 0444 01/29/22 0819 01/29/22 0910  BP: (!) 155/90 (!) 156/76 (!) 158/80 (!) 165/72  Pulse: 78 74 80 82  Resp: '18 18 18 13  '$ Temp: 97.7 F (36.5 C) 97.8 F (36.6 C) 98.2 F (36.8 C) 97.6 F (36.4 C)  TempSrc: Oral Oral Oral Temporal  SpO2: 97% 98% 97% 93%  Weight:  57.4 kg  57.4 kg  Height:        Intake/Output Summary (Last 24 hours)  at 01/29/2022 1133 Last data filed at 01/29/2022 1046 Gross per 24 hour  Intake 969.67 ml  Output 2950 ml  Net -1980.33 ml   Filed Weights   01/28/22 0339 01/29/22 0444 01/29/22 0910  Weight: 57.2 kg 57.4 kg 57.4 kg     Examination:  General exam: Chronically ill elderly female sitting up in bed, AAOx3 HEENT: No JVD CVS: S1-S2, regular rhythm Lungs: Decreased breath sounds to bases Abdomen: Soft, nontender, bowel sounds present Extremities: Trace edema Psychiatry: Flat affect    Data Reviewed:   CBC: Recent Labs  Lab 01/24/22 0935 01/24/22 0936 01/25/22 0934 01/25/22 0936 01/26/22 0522 01/27/22 0507 01/28/22 0117 01/29/22 0136  WBC 18.2*   < > 20.8*  --  16.0* 14.5* 10.3 11.2*  NEUTROABS 11.8*  --   --   --   --   --   --   --   HGB 9.1*   < > 8.6*  --  8.0* 7.8* 7.9* 9.4*  HCT 28.2*   < > 27.1*  --  24.3* 23.7* 23.5* 28.6*  MCV 94.9   < > 95.4  --  90.3 90.5 89.0 89.9  PLT 150   < > 123* 125* 108* 91* 93* 133*   < > = values in this interval not displayed.   Basic Metabolic Panel: Recent Labs  Lab 01/25/22 0514 01/25/22 1021 01/25/22 1449 01/26/22 0522 01/27/22 0507 01/28/22 0117 01/29/22 0136  NA 137 135 134* 142 135 144 143  K 4.8 4.8 4.5 3.7 3.2* 2.5* 3.5  CL 105 103 105 103 104 106 105  CO2 20* 18* 20* 21* 21* 26 28  GLUCOSE 188* 183* 178* 151* 124* 210* 192*  BUN 51* 50* 52* 54* 52* 45* 44*  CREATININE 2.67* 2.53* 2.49* 2.26* 1.87* 1.61* 1.47*  CALCIUM 7.3* 7.2* 7.2* 8.1* 7.2* 7.5* 7.5*  MG 1.7 1.6*  --  2.9*  --   --   --   PHOS  --  5.7*  --  5.4*  --   --   --    GFR: Estimated Creatinine Clearance: 22.4 mL/min (A) (by C-G formula based on SCr of 1.47 mg/dL (H)). Liver Function Tests: Recent Labs  Lab 01/25/22 1021 01/27/22 0507 01/28/22 0117 01/29/22 0136  AST 250* 212* 113* 57*  ALT 155* 116* 68* 30  ALKPHOS 61 79 68 77  BILITOT 0.5 0.7 0.8 0.4  PROT 4.6* 5.3* 4.9* 5.3*  ALBUMIN 2.4* 2.4* 2.3* 2.5*   Recent Labs  Lab 01/25/22 1021  LIPASE 23   No results for input(s): "AMMONIA" in the last 168 hours. Coagulation Profile: Recent Labs  Lab 01/24/22 0935 01/25/22 0936  INR 1.2 2.0*   Cardiac Enzymes: No results for input(s): "CKTOTAL",  "CKMB", "CKMBINDEX", "TROPONINI" in the last 168 hours. BNP (last 3 results) No results for input(s): "PROBNP" in the last 8760 hours. HbA1C: No results for input(s): "HGBA1C" in the last 72 hours.  CBG: Recent Labs  Lab 01/28/22 2025 01/29/22 0014 01/29/22 0447 01/29/22 0829 01/29/22 0925  GLUCAP 285* 239* 135* 154* 138*   Lipid Profile: No results for input(s): "CHOL", "HDL", "LDLCALC", "TRIG", "CHOLHDL", "LDLDIRECT" in the last 72 hours. Thyroid Function Tests: No results for input(s): "TSH", "T4TOTAL", "FREET4", "T3FREE", "THYROIDAB" in the last 72 hours. Anemia Panel: No results for input(s): "VITAMINB12", "FOLATE", "FERRITIN", "TIBC", "IRON", "RETICCTPCT" in the last 72 hours. Urine analysis:    Component Value Date/Time   COLORURINE YELLOW 01/24/2022 0954   APPEARANCEUR HAZY (A) 01/24/2022  0954   LABSPEC 1.018 01/24/2022 0954   PHURINE 5.0 01/24/2022 0954   GLUCOSEU NEGATIVE 01/24/2022 0954   HGBUR NEGATIVE 01/24/2022 [place] 01/24/2022 0954   [place] 01/24/2022 0954   PROTEINUR 30 (A) 01/24/2022 0954   NITRITE NEGATIVE 01/24/2022 0954   LEUKOCYTESUR MODERATE (A) 01/24/2022 0954   Sepsis Labs: '@LABRCNTIP'$ (procalcitonin:4,lacticidven:4)  ) Recent Results (from the past 240 hour(s))  Resp Panel by RT-PCR (Flu A&B, Covid) Anterior Nasal Swab     Status: None   Collection Time: 01/24/22  9:23 AM   Specimen: Anterior Nasal Swab  Result Value Ref Range Status   SARS Coronavirus 2 by RT PCR NEGATIVE NEGATIVE Final    Comment: (NOTE) SARS-CoV-2 target nucleic acids are NOT DETECTED.  The SARS-CoV-2 RNA is generally detectable in upper respiratory specimens during the acute phase of infection. The lowest concentration of SARS-CoV-2 viral copies this assay can detect is 138 copies/mL. A negative result does not preclude SARS-Cov-2 infection and should not be used as the sole basis for treatment or other patient management decisions. A  negative result may occur with  improper specimen collection/handling, submission of specimen other than nasopharyngeal swab, presence of viral mutation(s) within the areas targeted by this assay, and inadequate number of viral copies(<138 copies/mL). A negative result must be combined with clinical observations, patient history, and epidemiological information. The expected result is Negative.  Fact Sheet for Patients:  EntrepreneurPulse.com.au  Fact Sheet for Healthcare Providers:  IncredibleEmployment.be  This test is no t yet approved or cleared by the [place] FDA and  has been authorized for detection and/or diagnosis of SARS-CoV-2 by FDA under an Emergency Use Authorization (EUA). This EUA will remain  in effect (meaning this test can be used) for the duration of the COVID-19 declaration under Section 564(b)(1) of the Act, 21 U.S.C.section 360bbb-3(b)(1), unless the authorization is terminated  or revoked sooner.       Influenza A by PCR NEGATIVE NEGATIVE Final   Influenza B by PCR NEGATIVE NEGATIVE Final    Comment: (NOTE) The Xpert Xpress SARS-CoV-2/FLU/RSV plus assay is intended as an aid in the diagnosis of influenza from Nasopharyngeal swab specimens and should not be used as a sole basis for treatment. Nasal washings and aspirates are unacceptable for Xpert Xpress SARS-CoV-2/FLU/RSV testing.  Fact Sheet for Patients: EntrepreneurPulse.com.au  Fact Sheet for Healthcare Providers: IncredibleEmployment.be  This test is not yet approved or cleared by the [place] FDA and has been authorized for detection and/or diagnosis of SARS-CoV-2 by FDA under an Emergency Use Authorization (EUA). This EUA will remain in effect (meaning this test can be used) for the duration of the COVID-19 declaration under Section 564(b)(1) of the Act, 21 U.S.C. section 360bbb-3(b)(1), unless the authorization  is terminated or revoked.  Performed at [facility], [place] [address], [place], [place] [address]   Blood Culture (routine x 2)     Status: Abnormal   Collection Time: 01/24/22  9:23 AM   Specimen: BLOOD  Result Value Ref Range Status   Specimen Description BLOOD SITE NOT SPECIFIED  Final   Special Requests   Final    BOTTLES DRAWN AEROBIC AND ANAEROBIC Blood Culture adequate volume   Culture  Setup Time   Final    GRAM POSITIVE COCCI IN PAIRS IN CLUSTERS IN BOTH AEROBIC AND ANAEROBIC BOTTLES CRITICAL RESULT CALLED TO, READ BACK BY AND VERIFIED WITH: T RUDISILL,PHARMD'@0004'$  01/25/22 [place] Performed at [facility], [place]  [address], [place], [place] [address]    Culture STAPHYLOCOCCUS AUREUS (A)  Final   Report Status 01/26/2022 FINAL  Final   Organism ID, Bacteria STAPHYLOCOCCUS AUREUS  Final      Susceptibility   Staphylococcus aureus - MIC*    CIPROFLOXACIN <=0.5 SENSITIVE Sensitive     ERYTHROMYCIN <=0.25 SENSITIVE Sensitive     GENTAMICIN <=0.5 SENSITIVE Sensitive     OXACILLIN 0.5 SENSITIVE Sensitive     TETRACYCLINE <=1 SENSITIVE Sensitive     VANCOMYCIN <=0.5 SENSITIVE Sensitive     TRIMETH/SULFA <=10 SENSITIVE Sensitive     CLINDAMYCIN <=0.25 SENSITIVE Sensitive     RIFAMPIN <=0.5 SENSITIVE Sensitive     Inducible Clindamycin NEGATIVE Sensitive     * STAPHYLOCOCCUS AUREUS  Urine Culture     Status: Abnormal   Collection Time: 01/24/22  9:23 AM   Specimen: In/Out Cath Urine  Result Value Ref Range Status   Specimen Description IN/OUT CATH URINE  Final   Special Requests   Final    NONE Performed at [facility], [place] [address] [place], [place] [address]    Culture (A)  Final    40,000 COLONIES/mL ESCHERICHIA COLI 40,000 COLONIES/mL PSEUDOMONAS AERUGINOSA    Report Status 01/26/2022 FINAL  Final   Organism ID, Bacteria ESCHERICHIA COLI (A)  Final   Organism ID, Bacteria PSEUDOMONAS AERUGINOSA (A)  Final      Susceptibility   Escherichia coli -  MIC*    AMPICILLIN 16 INTERMEDIATE Intermediate     CEFAZOLIN <=4 SENSITIVE Sensitive     CEFEPIME <=0.12 SENSITIVE Sensitive     CEFTRIAXONE <=0.25 SENSITIVE Sensitive     CIPROFLOXACIN <=0.25 SENSITIVE Sensitive     GENTAMICIN <=1 SENSITIVE Sensitive     IMIPENEM <=0.25 SENSITIVE Sensitive     NITROFURANTOIN 32 SENSITIVE Sensitive     TRIMETH/SULFA <=20 SENSITIVE Sensitive     AMPICILLIN/SULBACTAM 4 SENSITIVE Sensitive     PIP/TAZO <=4 SENSITIVE Sensitive     * 40,000 COLONIES/mL ESCHERICHIA COLI   Pseudomonas aeruginosa - MIC*    CEFTAZIDIME 2 SENSITIVE Sensitive     CIPROFLOXACIN 0.5 SENSITIVE Sensitive     GENTAMICIN <=1 SENSITIVE Sensitive     IMIPENEM 2 SENSITIVE Sensitive     PIP/TAZO 8 SENSITIVE Sensitive     CEFEPIME 4 SENSITIVE Sensitive     * 40,000 COLONIES/mL PSEUDOMONAS AERUGINOSA  Blood Culture ID Panel (Reflexed)     Status: Abnormal   Collection Time: 01/24/22  9:23 AM  Result Value Ref Range Status   Enterococcus faecalis NOT DETECTED NOT DETECTED Final   Enterococcus Faecium NOT DETECTED NOT DETECTED Final   Listeria monocytogenes NOT DETECTED NOT DETECTED Final   Staphylococcus species DETECTED (A) NOT DETECTED Final    Comment: CRITICAL RESULT CALLED TO, READ BACK BY AND VERIFIED WITH: T RUDISILL,PHARMD'@0004'$  01/25/22 [place]    Staphylococcus aureus (BCID) DETECTED (A) NOT DETECTED Final    Comment: CRITICAL RESULT CALLED TO, READ BACK BY AND VERIFIED WITH: T RUDISILL,PHARMD'@0004'$  01/25/22 [place]    Staphylococcus epidermidis NOT DETECTED NOT DETECTED Final   Staphylococcus lugdunensis NOT DETECTED NOT DETECTED Final   Streptococcus species NOT DETECTED NOT DETECTED Final   Streptococcus agalactiae NOT DETECTED NOT DETECTED Final   Streptococcus pneumoniae NOT DETECTED NOT DETECTED Final   Streptococcus pyogenes NOT DETECTED NOT DETECTED Final   A.calcoaceticus-baumannii NOT DETECTED NOT DETECTED Final   Bacteroides fragilis NOT DETECTED NOT DETECTED Final    Enterobacterales NOT DETECTED NOT DETECTED Final  Enterobacter cloacae complex NOT DETECTED NOT DETECTED Final   Escherichia coli NOT DETECTED NOT DETECTED Final   Klebsiella aerogenes NOT DETECTED NOT DETECTED Final   Klebsiella oxytoca NOT DETECTED NOT DETECTED Final   Klebsiella pneumoniae NOT DETECTED NOT DETECTED Final   Proteus species NOT DETECTED NOT DETECTED Final   Salmonella species NOT DETECTED NOT DETECTED Final   Serratia marcescens NOT DETECTED NOT DETECTED Final   Haemophilus influenzae NOT DETECTED NOT DETECTED Final   Neisseria meningitidis NOT DETECTED NOT DETECTED Final   Pseudomonas aeruginosa NOT DETECTED NOT DETECTED Final   Stenotrophomonas maltophilia NOT DETECTED NOT DETECTED Final   Candida albicans NOT DETECTED NOT DETECTED Final   Candida auris NOT DETECTED NOT DETECTED Final   Candida glabrata NOT DETECTED NOT DETECTED Final   Candida krusei NOT DETECTED NOT DETECTED Final   Candida parapsilosis NOT DETECTED NOT DETECTED Final   Candida tropicalis NOT DETECTED NOT DETECTED Final   Cryptococcus neoformans/gattii NOT DETECTED NOT DETECTED Final   Meth resistant mecA/C and MREJ NOT DETECTED NOT DETECTED Final    Comment: Performed at [facility], [place] [address], [place], [place] [address]  Blood Culture (routine x 2)     Status: Abnormal   Collection Time: 01/24/22  9:28 AM   Specimen: BLOOD  Result Value Ref Range Status   Specimen Description BLOOD SITE NOT SPECIFIED  Final   Special Requests   Final    BOTTLES DRAWN AEROBIC AND ANAEROBIC Blood Culture results may not be optimal due to an excessive volume of blood received in culture bottles   Culture  Setup Time   Final    GRAM POSITIVE COCCI IN PAIRS IN BOTH AEROBIC AND ANAEROBIC BOTTLES CRITICAL VALUE NOTED.  VALUE IS CONSISTENT WITH PREVIOUSLY REPORTED AND CALLED VALUE.    Culture (A)  Final    STAPHYLOCOCCUS AUREUS SUSCEPTIBILITIES PERFORMED ON PREVIOUS CULTURE WITHIN THE LAST 5  DAYS. Performed at [facility], [place] [address] [place], [place] [address]    Report Status 01/26/2022 FINAL  Final  Gastrointestinal Panel by PCR , Stool     Status: None   Collection Time: 01/24/22 10:36 AM   Specimen: Stool  Result Value Ref Range Status   Campylobacter species NOT DETECTED NOT DETECTED Final   Plesimonas shigelloides NOT DETECTED NOT DETECTED Final   Salmonella species NOT DETECTED NOT DETECTED Final   Yersinia enterocolitica NOT DETECTED NOT DETECTED Final   Vibrio species NOT DETECTED NOT DETECTED Final   Vibrio cholerae NOT DETECTED NOT DETECTED Final   Enteroaggregative E coli (EAEC) NOT DETECTED NOT DETECTED Final   Enteropathogenic E coli (EPEC) NOT DETECTED NOT DETECTED Final   Enterotoxigenic E coli (ETEC) NOT DETECTED NOT DETECTED Final   Shiga like toxin producing E coli (STEC) NOT DETECTED NOT DETECTED Final   Shigella/Enteroinvasive E coli (EIEC) NOT DETECTED NOT DETECTED Final   Cryptosporidium NOT DETECTED NOT DETECTED Final   Cyclospora cayetanensis NOT DETECTED NOT DETECTED Final   Entamoeba histolytica NOT DETECTED NOT DETECTED Final   Giardia lamblia NOT DETECTED NOT DETECTED Final   Adenovirus F40/41 NOT DETECTED NOT DETECTED Final   Astrovirus NOT DETECTED NOT DETECTED Final   Norovirus GI/GII NOT DETECTED NOT DETECTED Final   Rotavirus A NOT DETECTED NOT DETECTED Final   Sapovirus (I, II, IV, and V) NOT DETECTED NOT DETECTED Final    Comment: Performed at [facility], [address] [place],  [address]  MRSA Next Gen by PCR,  Nasal     Status: None   Collection Time: 01/24/22 11:00 PM   Specimen: Nasal Mucosa; Nasal Swab  Result Value Ref Range Status   MRSA by PCR Next Gen NOT DETECTED NOT DETECTED Final    Comment: (NOTE) The GeneXpert MRSA Assay (FDA approved for NASAL specimens only), is one component of a comprehensive MRSA colonization surveillance program. It is not intended to diagnose MRSA infection nor  to guide or monitor treatment for MRSA infections. Test performance is not FDA approved in patients less than 61 years old. Performed at [facility], [place] [address] [place], [place] [address]   C Difficile Quick Screen w PCR reflex     Status: None   Collection Time: 01/25/22  5:14 AM   Specimen: STOOL  Result Value Ref Range Status   C Diff antigen NEGATIVE NEGATIVE Final   C Diff toxin NEGATIVE NEGATIVE Final   C Diff interpretation No C. difficile detected.  Final    Comment: Performed at [facility], [place] [address], [place], [place] [address]  Culture, blood (Routine X 2) w Reflex to ID Panel     Status: None (Preliminary result)   Collection Time: 01/25/22  2:56 PM   Specimen: BLOOD  Result Value Ref Range Status   Specimen Description BLOOD SITE NOT SPECIFIED  Final   Special Requests   Final    BOTTLES DRAWN AEROBIC AND ANAEROBIC Blood Culture adequate volume   Culture   Final    NO GROWTH 4 DAYS Performed at [facility], [address] [place], [place] [address]    Report Status PENDING  Incomplete  Culture, blood (Routine X 2) w Reflex to ID Panel     Status: None (Preliminary result)   Collection Time: 01/25/22  2:56 PM   Specimen: BLOOD  Result Value Ref Range Status   Specimen Description BLOOD SITE NOT SPECIFIED  Final   Special Requests   Final    BOTTLES DRAWN AEROBIC AND ANAEROBIC Blood Culture adequate volume   Culture   Final    NO GROWTH 4 DAYS Performed at [facility], [address] [place], [place] [address]    Report Status PENDING  Incomplete     Radiology Studies: No results found.   Scheduled Meds:  [MAR Hold] Chlorhexidine Gluconate Cloth  6 each Topical Daily   gentamicin (GARAMYCIN) 80 mg in sodium chloride 0.9 % 500 mL irrigation  80 mg Irrigation On Call   [MAR Hold] insulin aspart  0-9 Units Subcutaneous Q4H   [MAR Hold] lactose free nutrition  237 mL Oral BID BM   [MAR Hold] lidocaine  1 patch Transdermal  Q24H   [MAR Hold] multivitamin  1 tablet Oral QHS   [MAR Hold] mouth rinse  15 mL Mouth Rinse 4 times per day   [MAR Hold] pantoprazole  40 mg Oral Daily   [MAR Hold] potassium chloride  40 mEq Oral BID   [MAR Hold] predniSONE  5 mg Oral Q breakfast   [MAR Hold] saccharomyces boulardii  250 mg Oral BID   [MAR Hold] sodium chloride flush  3 mL Intravenous Q12H   Continuous Infusions:  sodium chloride 20 mL/hr at 01/29/22 0457   sodium chloride 50 mL/hr at 01/29/22 0818   sodium chloride 250 mL (01/29/22 0822)   [MAR Hold]  ceFAZolin (ANCEF) IV 2 g (01/28/22 2257)    ceFAZolin (ANCEF) IV       LOS: 5 days  Time spent: 70mn  PDomenic Polite MD [facility]   01/29/2022, 11:33 AM

## 2022-01-29 NOTE — Progress Notes (Signed)
  Echocardiogram Echocardiogram Transesophageal has been performed.  Darlina Sicilian M 01/29/2022, 11:07 AM

## 2022-01-29 NOTE — Interval H&P Note (Signed)
History and Physical Interval Note:  01/29/2022 9:23 AM  Shelby Walker  has presented today for surgery, with the diagnosis of bacteremia.  The various methods of treatment have been discussed with the patient and family. After consideration of risks, benefits and other options for treatment, the patient has consented to  Procedure(s): PPM GENERATOR REMOVAL (N/A) as a surgical intervention.  The patient's history has been reviewed, patient examined, no change in status, stable for surgery.  I have reviewed the patient's chart and labs.  Questions were answered to the patient's satisfaction.     Cristopher Peru

## 2022-01-29 NOTE — Progress Notes (Signed)
PT Cancellation Note  Patient Details Name: KAYLIANA CODD MRN: [id number] DOB: [birth year]   Cancelled Treatment:    Reason Eval/Treat Not Completed: Patient not medically ready Pt orders received, chart reviewed. Per chart, pt is s/p PPM removal & latest nursing note sates pt with irregular HR & a-fib, elevated to 120s-130s. Will hold PT evaluation at this time & f/u as pt is more appropriate for exertional activity.  Lavone Nian, PT, DPT 01/29/22, 2:55 PM   Waunita Schooner 01/29/2022, 2:54 PM

## 2022-01-29 NOTE — Progress Notes (Signed)
Patient returned to 4E from Cath lab. Vitals taken and BP in the 180s. Report from Cath lab RN that MD was notified for BP. Patient nauseous and was given Zofran. Patient oriented to unit and staff. Call bell within reach. Family at the bedside. Shelby Walker

## 2022-01-30 ENCOUNTER — Inpatient Hospital Stay: Payer: Self-pay

## 2022-01-30 ENCOUNTER — Encounter (HOSPITAL_COMMUNITY): Payer: Self-pay | Admitting: Cardiovascular Disease

## 2022-01-30 DIAGNOSIS — A419 Sepsis, unspecified organism: Secondary | ICD-10-CM | POA: Diagnosis not present

## 2022-01-30 DIAGNOSIS — T827XXD Infection and inflammatory reaction due to other cardiac and vascular devices, implants and grafts, subsequent encounter: Secondary | ICD-10-CM

## 2022-01-30 DIAGNOSIS — R7881 Bacteremia: Secondary | ICD-10-CM | POA: Diagnosis not present

## 2022-01-30 DIAGNOSIS — M549 Dorsalgia, unspecified: Secondary | ICD-10-CM | POA: Diagnosis not present

## 2022-01-30 DIAGNOSIS — Z8619 Personal history of other infectious and parasitic diseases: Secondary | ICD-10-CM | POA: Diagnosis not present

## 2022-01-30 DIAGNOSIS — M545 Low back pain, unspecified: Secondary | ICD-10-CM | POA: Diagnosis not present

## 2022-01-30 DIAGNOSIS — Z950 Presence of cardiac pacemaker: Secondary | ICD-10-CM | POA: Diagnosis not present

## 2022-01-30 LAB — CBC
HCT: 29.9 % — ABNORMAL LOW (ref 36.0–46.0)
Hemoglobin: 9.5 g/dL — ABNORMAL LOW (ref 12.0–15.0)
MCH: 29.3 pg (ref 26.0–34.0)
MCHC: 31.8 g/dL (ref 30.0–36.0)
MCV: 92.3 fL (ref 80.0–100.0)
Platelets: 142 10*3/uL — ABNORMAL LOW (ref 150–400)
RBC: 3.24 MIL/uL — ABNORMAL LOW (ref 3.87–5.11)
RDW: 15.6 % — ABNORMAL HIGH (ref 11.5–15.5)
WBC: 8.3 10*3/uL (ref 4.0–10.5)
nRBC: 0 % (ref 0.0–0.2)

## 2022-01-30 LAB — GLUCOSE, CAPILLARY
Glucose-Capillary: 145 mg/dL — ABNORMAL HIGH (ref 70–99)
Glucose-Capillary: 162 mg/dL — ABNORMAL HIGH (ref 70–99)
Glucose-Capillary: 170 mg/dL — ABNORMAL HIGH (ref 70–99)
Glucose-Capillary: 188 mg/dL — ABNORMAL HIGH (ref 70–99)
Glucose-Capillary: 217 mg/dL — ABNORMAL HIGH (ref 70–99)
Glucose-Capillary: 269 mg/dL — ABNORMAL HIGH (ref 70–99)

## 2022-01-30 LAB — BASIC METABOLIC PANEL
Anion gap: 15 (ref 5–15)
BUN: 36 mg/dL — ABNORMAL HIGH (ref 8–23)
CO2: 26 mmol/L (ref 22–32)
Calcium: 7.5 mg/dL — ABNORMAL LOW (ref 8.9–10.3)
Chloride: 102 mmol/L (ref 98–111)
Creatinine, Ser: 1.35 mg/dL — ABNORMAL HIGH (ref 0.44–1.00)
GFR, Estimated: 38 mL/min — ABNORMAL LOW (ref >60)
Glucose, Bld: 174 mg/dL — ABNORMAL HIGH (ref 70–99)
Potassium: 4.2 mmol/L (ref 3.5–5.1)
Sodium: 143 mmol/L (ref 135–145)

## 2022-01-30 LAB — CULTURE, BLOOD (ROUTINE X 2)
Culture: NO GROWTH
Culture: NO GROWTH
Special Requests: ADEQUATE
Special Requests: ADEQUATE

## 2022-01-30 MED ORDER — AMLODIPINE BESYLATE 10 MG PO TABS
10.0000 mg | ORAL_TABLET | Freq: Every day | ORAL | Status: DC
  Administered 2022-01-30 – 2022-02-02 (×3): 10 mg via ORAL
  Filled 2022-01-30 (×4): qty 1

## 2022-01-30 MED ORDER — AMIODARONE HCL 200 MG PO TABS: 200.0000 mg | ORAL_TABLET | Freq: Every day | ORAL | Status: DC

## 2022-01-30 MED ORDER — VANCOMYCIN HCL 125 MG PO CAPS
125.0000 mg | ORAL_CAPSULE | Freq: Every day | ORAL | Status: DC
  Administered 2022-01-30: 125 mg via ORAL
  Filled 2022-01-30 (×2): qty 1

## 2022-01-30 MED ORDER — VALACYCLOVIR HCL 500 MG PO TABS
500.0000 mg | ORAL_TABLET | Freq: Once | ORAL | Status: AC
  Administered 2022-01-30: 500 mg via ORAL
  Filled 2022-01-30: qty 1

## 2022-01-30 MED ORDER — FUROSEMIDE 10 MG/ML IJ SOLN
40.0000 mg | Freq: Once | INTRAMUSCULAR | Status: AC
  Administered 2022-01-30: 40 mg via INTRAVENOUS
  Filled 2022-01-30: qty 4

## 2022-01-30 MED ORDER — AMIODARONE HCL 200 MG PO TABS
200.0000 mg | ORAL_TABLET | Freq: Two times a day (BID) | ORAL | Status: DC
  Administered 2022-01-30 – 2022-02-02 (×7): 200 mg via ORAL
  Filled 2022-01-30 (×8): qty 1

## 2022-01-30 NOTE — Progress Notes (Signed)
? ?  Inpatient Rehab Admissions Coordinator : ? ?Per therapy recommendations, patient was screened for CIR candidacy by Jeidi Gilles RN MSN.  At this time patient appears to be a potential candidate for CIR. I will place a rehab consult per protocol for full assessment. Please call me with any questions. ? ?Shlomie Romig RN MSN ?Admissions Coordinator ?[phone] ?  ?

## 2022-01-30 NOTE — Progress Notes (Signed)
[facility] Infectious [facility]  Date of Admission:  01/24/2022     Lines:  11/03-11/27 pacer   Abx: 11/22-c cefazolin                                                       Assessment: Mssa bacteremia Severe sepsis with septic shock S/p permanent pacemaker placement Assymptomatic bacteriuria Hx recurrent cdiff; previously severe presentation Troponinemia Aki Elevated lft Elevated inr -- dic r/o'ed Dm2 ckd3   88 yo female with dm2 hx right tma, ckd3, sinus arrest s/p pacer 11/03 admitted for 1 day chill, n/v/diarrhea, chest-abd pain, found to have mssa septic shock, briefly requiring pressor now off on HD#1   11/22 bcx mssa 11/22 ucx <50k ecoli/pseudomonas. I do not think she has GU infection, and this is all mssa sepsis 11/23 stool cdiff testing negative toxin/antigen   11/22 EKG sinus rhythm without pacer activity 11/22 Ct chest abd pelv showed sign of pulm edema as well and bilateral perinephric stranding; no diverticulitis 11/23 tte no obvious new eccentric regurg or tell tale valve changes for endocarditis     Concern for pacer infection and EP had removed this 11/27 Periop 11/27 tee no valve vegetation    ------------- 11/28 assessment Pacer removed 11/27; periop tee no valve veg Afib with rvr and she had refused [facility] Card[facility] plan to monitor near future to see if new pacer needed  No diarrhea. Abd pain appears mild-mod and stable without distention. No diarrhea or stool since admission. Hx of cdiff but again negative cdiff screen and clinical picture not consistent. Given age and severe cdiff in the past would start prophylaxis cdiff treatment while on abx  Persistent midline pain in back and reasonable to scan mri tomorrow    Plan: Continue cefazolin Start bid oral vancomycin while on systemic abx Mri t and l spine ordered for tomorrow for persistent mod midline pain r/o om/epidural abscess Consider transition to oral abx to finish  6 weeks abx after 10-14 days iv abx Discussed with primary team   I spent more than 50 minute reviewing data/chart, and coordinating care and >50% direct face to face time providing counseling/discussing diagnostics/treatment plan with patient   Principal Problem:   Severe sepsis with septic shock ([place]) Active Problems:   AKI (acute kidney injury) ([place])   MSSA bacteremia   Presence of cardiac pacemaker   Acute midline low back pain without sciatica   Allergies  Allergen Reactions   Cholestyramine     Possible- Makes throat burn. Patient said she can't take it    Compazine [Prochlorperazine Edisylate] Swelling    REACTION: " tongue swell and unable to swallow"   Sulfonamide Derivatives Other (See Comments)    REACTION: " broke out with fine itching bumps"   Hydrocodone Other (See Comments)   Infliximab Other (See Comments)    Other reaction(s): rash   Leflunomide     Other reaction(s): diarrhea   Prochlorperazine Other (See Comments)    Other reaction(s): Unknown   Rosuvastatin     Other reaction(s): muscle aches   Sulfa Antibiotics Other (See Comments)    Other reaction(s): tongue swelling    Scheduled Meds:  amiodarone  200 mg Oral BID   Followed by   Derrill Memo ON 02/04/2022] amiodarone  200 mg  Oral Daily   amLODipine  10 mg Oral Daily   Chlorhexidine Gluconate Cloth  6 each Topical Daily   insulin aspart  0-9 Units Subcutaneous Q4H   lactose free nutrition  237 mL Oral BID BM   lidocaine  1 patch Transdermal Q24H   multivitamin  1 tablet Oral QHS   mouth rinse  15 mL Mouth Rinse 4 times per day   pantoprazole  40 mg Oral Daily   predniSONE  5 mg Oral Q breakfast   saccharomyces boulardii  250 mg Oral BID   sodium chloride flush  3 mL Intravenous Q12H   Continuous Infusions:  sodium chloride 20 mL/hr at 01/30/22 0401   sodium chloride 20 mL/hr at 01/30/22 0401    ceFAZolin (ANCEF) IV 2 g (01/30/22 0835)   PRN Meds:.acetaminophen, docusate sodium, hydrALAZINE,  HYDROmorphone (DILAUDID) injection, ondansetron (ZOFRAN) IV, mouth rinse, polyethylene glycol   SUBJECTIVE: Continue to complain of back pain No complaint of abd pain until we specifically asked "some pain" "same as before" No stool since admission Wbc down No rash    Review of Systems: ROS All other ROS was negative, except mentioned above     OBJECTIVE: Vitals:   01/30/22 0004 01/30/22 0451 01/30/22 0800 01/30/22 1100  BP: 109/87 (!) 140/65 (!) 180/66 (!) 157/52  Pulse: 97 75 79 73  Resp: '17 18 18 18  '$ Temp: 98.5 F (36.9 C) 98.4 F (36.9 C) 98.4 F (36.9 C) 98.6 F (37 C)  TempSrc: Oral Oral Oral Oral  SpO2: 96% 91% 94% 94%  Weight:      Height:       Body mass index is 23.91 kg/m.  Physical Exam General/constitutional: no distress, pleasant HEENT: Normocephalic, PER, Conj Clear, EOMI, Oropharynx clear Neck supple CV: rrr no mrg Lungs: clear to auscultation, normal respiratory effort Abd: Soft, Nontender Ext: no edema Skin: No Rash Neuro: nonfocal MSK: lower thoracic and upper lumbar midline tenderness   Lab Results Lab Results  Component Value Date   WBC 8.3 01/30/2022   HGB 9.5 (L) 01/30/2022   HCT 29.9 (L) 01/30/2022   MCV 92.3 01/30/2022   PLT 142 (L) 01/30/2022    Lab Results  Component Value Date   CREATININE 1.35 (H) 01/30/2022   BUN 36 (H) 01/30/2022   NA 143 01/30/2022   K 4.2 01/30/2022   CL 102 01/30/2022   CO2 26 01/30/2022    Lab Results  Component Value Date   ALT 30 01/29/2022   AST 57 (H) 01/29/2022   ALKPHOS 77 01/29/2022   BILITOT 0.4 01/29/2022      Microbiology: Recent Results (from the past 240 hour(s))  Resp Panel by RT-PCR (Flu A&B, Covid) Anterior Nasal Swab     Status: None   Collection Time: 01/24/22  9:23 AM   Specimen: Anterior Nasal Swab  Result Value Ref Range Status   SARS Coronavirus 2 by RT PCR NEGATIVE NEGATIVE Final    Comment: (NOTE) SARS-CoV-2 target nucleic acids are NOT DETECTED.  The  SARS-CoV-2 RNA is generally detectable in upper respiratory specimens during the acute phase of infection. The lowest concentration of SARS-CoV-2 viral copies this assay can detect is 138 copies/mL. A negative result does not preclude SARS-Cov-2 infection and should not be used as the sole basis for treatment or other patient management decisions. A negative result may occur with  improper specimen collection/handling, submission of specimen other than nasopharyngeal swab, presence of viral mutation(s) within the areas targeted by this assay,  and inadequate number of viral copies(<138 copies/mL). A negative result must be combined with clinical observations, patient history, and epidemiological information. The expected result is Negative.  Fact Sheet for Patients:  EntrepreneurPulse.com.au  Fact Sheet for Healthcare Providers:  IncredibleEmployment.be  This test is no t yet approved or cleared by the [place] FDA and  has been authorized for detection and/or diagnosis of SARS-CoV-2 by FDA under an Emergency Use Authorization (EUA). This EUA will remain  in effect (meaning this test can be used) for the duration of the COVID-19 declaration under Section 564(b)(1) of the Act, 21 U.S.C.section 360bbb-3(b)(1), unless the authorization is terminated  or revoked sooner.       Influenza A by PCR NEGATIVE NEGATIVE Final   Influenza B by PCR NEGATIVE NEGATIVE Final    Comment: (NOTE) The Xpert Xpress SARS-CoV-2/FLU/RSV plus assay is intended as an aid in the diagnosis of influenza from Nasopharyngeal swab specimens and should not be used as a sole basis for treatment. Nasal washings and aspirates are unacceptable for Xpert Xpress SARS-CoV-2/FLU/RSV testing.  Fact Sheet for Patients: EntrepreneurPulse.com.au  Fact Sheet for Healthcare Providers: IncredibleEmployment.be  This test is not yet approved or  cleared by the [place] FDA and has been authorized for detection and/or diagnosis of SARS-CoV-2 by FDA under an Emergency Use Authorization (EUA). This EUA will remain in effect (meaning this test can be used) for the duration of the COVID-19 declaration under Section 564(b)(1) of the Act, 21 U.S.C. section 360bbb-3(b)(1), unless the authorization is terminated or revoked.  Performed at [facility], [place] [address] [place], [place] [address]   Blood Culture (routine x 2)     Status: Abnormal   Collection Time: 01/24/22  9:23 AM   Specimen: BLOOD  Result Value Ref Range Status   Specimen Description BLOOD SITE NOT SPECIFIED  Final   Special Requests   Final    BOTTLES DRAWN AEROBIC AND ANAEROBIC Blood Culture adequate volume   Culture  Setup Time   Final    GRAM POSITIVE COCCI IN PAIRS IN CLUSTERS IN BOTH AEROBIC AND ANAEROBIC BOTTLES CRITICAL RESULT CALLED TO, READ BACK BY AND VERIFIED WITH: T RUDISILL,PHARMD'@0004'$  01/25/22 [place] Performed at [facility], [place] [address] [place], [place] [address]    Culture STAPHYLOCOCCUS AUREUS (A)  Final   Report Status 01/26/2022 FINAL  Final   Organism ID, Bacteria STAPHYLOCOCCUS AUREUS  Final      Susceptibility   Staphylococcus aureus - MIC*    CIPROFLOXACIN <=0.5 SENSITIVE Sensitive     ERYTHROMYCIN <=0.25 SENSITIVE Sensitive     GENTAMICIN <=0.5 SENSITIVE Sensitive     OXACILLIN 0.5 SENSITIVE Sensitive     TETRACYCLINE <=1 SENSITIVE Sensitive     VANCOMYCIN <=0.5 SENSITIVE Sensitive     TRIMETH/SULFA <=10 SENSITIVE Sensitive     CLINDAMYCIN <=0.25 SENSITIVE Sensitive     RIFAMPIN <=0.5 SENSITIVE Sensitive     Inducible Clindamycin NEGATIVE Sensitive     * STAPHYLOCOCCUS AUREUS  Urine Culture     Status: Abnormal   Collection Time: 01/24/22  9:23 AM   Specimen: In/Out Cath Urine  Result Value Ref Range Status   Specimen Description IN/OUT CATH URINE  Final   Special Requests   Final    NONE Performed at [facility], [place] [address] [place],  [address]    Culture (A)  Final    40,000 COLONIES/mL ESCHERICHIA COLI 40,000 COLONIES/mL PSEUDOMONAS AERUGINOSA    Report Status  01/26/2022 FINAL  Final   Organism ID, Bacteria ESCHERICHIA COLI (A)  Final   Organism ID, Bacteria PSEUDOMONAS AERUGINOSA (A)  Final      Susceptibility   Escherichia coli - MIC*    AMPICILLIN 16 INTERMEDIATE Intermediate     CEFAZOLIN <=4 SENSITIVE Sensitive     CEFEPIME <=0.12 SENSITIVE Sensitive     CEFTRIAXONE <=0.25 SENSITIVE Sensitive     CIPROFLOXACIN <=0.25 SENSITIVE Sensitive     GENTAMICIN <=1 SENSITIVE Sensitive     IMIPENEM <=0.25 SENSITIVE Sensitive     NITROFURANTOIN 32 SENSITIVE Sensitive     TRIMETH/SULFA <=20 SENSITIVE Sensitive     AMPICILLIN/SULBACTAM 4 SENSITIVE Sensitive     PIP/TAZO <=4 SENSITIVE Sensitive     * 40,000 COLONIES/mL ESCHERICHIA COLI   Pseudomonas aeruginosa - MIC*    CEFTAZIDIME 2 SENSITIVE Sensitive     CIPROFLOXACIN 0.5 SENSITIVE Sensitive     GENTAMICIN <=1 SENSITIVE Sensitive     IMIPENEM 2 SENSITIVE Sensitive     PIP/TAZO 8 SENSITIVE Sensitive     CEFEPIME 4 SENSITIVE Sensitive     * 40,000 COLONIES/mL PSEUDOMONAS AERUGINOSA  Blood Culture ID Panel (Reflexed)     Status: Abnormal   Collection Time: 01/24/22  9:23 AM  Result Value Ref Range Status   Enterococcus faecalis NOT DETECTED NOT DETECTED Final   Enterococcus Faecium NOT DETECTED NOT DETECTED Final   Listeria monocytogenes NOT DETECTED NOT DETECTED Final   Staphylococcus species DETECTED (A) NOT DETECTED Final    Comment: CRITICAL RESULT CALLED TO, READ BACK BY AND VERIFIED WITH: T RUDISILL,PHARMD'@0004'$  01/25/22 [place]    Staphylococcus aureus (BCID) DETECTED (A) NOT DETECTED Final    Comment: CRITICAL RESULT CALLED TO, READ BACK BY AND VERIFIED WITH: T RUDISILL,PHARMD'@0004'$  01/25/22 [place]    Staphylococcus epidermidis NOT DETECTED NOT DETECTED Final   Staphylococcus lugdunensis NOT DETECTED NOT DETECTED  Final   Streptococcus species NOT DETECTED NOT DETECTED Final   Streptococcus agalactiae NOT DETECTED NOT DETECTED Final   Streptococcus pneumoniae NOT DETECTED NOT DETECTED Final   Streptococcus pyogenes NOT DETECTED NOT DETECTED Final   A.calcoaceticus-baumannii NOT DETECTED NOT DETECTED Final   Bacteroides fragilis NOT DETECTED NOT DETECTED Final   Enterobacterales NOT DETECTED NOT DETECTED Final   Enterobacter cloacae complex NOT DETECTED NOT DETECTED Final   Escherichia coli NOT DETECTED NOT DETECTED Final   Klebsiella aerogenes NOT DETECTED NOT DETECTED Final   Klebsiella oxytoca NOT DETECTED NOT DETECTED Final   Klebsiella pneumoniae NOT DETECTED NOT DETECTED Final   Proteus species NOT DETECTED NOT DETECTED Final   Salmonella species NOT DETECTED NOT DETECTED Final   Serratia marcescens NOT DETECTED NOT DETECTED Final   Haemophilus influenzae NOT DETECTED NOT DETECTED Final   Neisseria meningitidis NOT DETECTED NOT DETECTED Final   Pseudomonas aeruginosa NOT DETECTED NOT DETECTED Final   Stenotrophomonas maltophilia NOT DETECTED NOT DETECTED Final   Candida albicans NOT DETECTED NOT DETECTED Final   Candida auris NOT DETECTED NOT DETECTED Final   Candida glabrata NOT DETECTED NOT DETECTED Final   Candida krusei NOT DETECTED NOT DETECTED Final   Candida parapsilosis NOT DETECTED NOT DETECTED Final   Candida tropicalis NOT DETECTED NOT DETECTED Final   Cryptococcus neoformans/gattii NOT DETECTED NOT DETECTED Final   Meth resistant mecA/C and MREJ NOT DETECTED NOT DETECTED Final    Comment: Performed at [facility], [address] [place], [place] [address]  Blood Culture (routine x 2)     Status: Abnormal   Collection  Time: 01/24/22  9:28 AM   Specimen: BLOOD  Result Value Ref Range Status   Specimen Description BLOOD SITE NOT SPECIFIED  Final   Special Requests   Final    BOTTLES DRAWN AEROBIC AND ANAEROBIC Blood Culture results may not be optimal due to an excessive  volume of blood received in culture bottles   Culture  Setup Time   Final    GRAM POSITIVE COCCI IN PAIRS IN BOTH AEROBIC AND ANAEROBIC BOTTLES CRITICAL VALUE NOTED.  VALUE IS CONSISTENT WITH PREVIOUSLY REPORTED AND CALLED VALUE.    Culture (A)  Final    STAPHYLOCOCCUS AUREUS SUSCEPTIBILITIES PERFORMED ON PREVIOUS CULTURE WITHIN THE LAST 5 DAYS. Performed at [facility], [place] [address], [place], [place] [address]    Report Status 01/26/2022 FINAL  Final  Gastrointestinal Panel by PCR , Stool     Status: None   Collection Time: 01/24/22 10:36 AM   Specimen: Stool  Result Value Ref Range Status   Campylobacter species NOT DETECTED NOT DETECTED Final   Plesimonas shigelloides NOT DETECTED NOT DETECTED Final   Salmonella species NOT DETECTED NOT DETECTED Final   Yersinia enterocolitica NOT DETECTED NOT DETECTED Final   Vibrio species NOT DETECTED NOT DETECTED Final   Vibrio cholerae NOT DETECTED NOT DETECTED Final   Enteroaggregative E coli (EAEC) NOT DETECTED NOT DETECTED Final   Enteropathogenic E coli (EPEC) NOT DETECTED NOT DETECTED Final   Enterotoxigenic E coli (ETEC) NOT DETECTED NOT DETECTED Final   Shiga like toxin producing E coli (STEC) NOT DETECTED NOT DETECTED Final   Shigella/Enteroinvasive E coli (EIEC) NOT DETECTED NOT DETECTED Final   Cryptosporidium NOT DETECTED NOT DETECTED Final   Cyclospora cayetanensis NOT DETECTED NOT DETECTED Final   Entamoeba histolytica NOT DETECTED NOT DETECTED Final   Giardia lamblia NOT DETECTED NOT DETECTED Final   Adenovirus F40/41 NOT DETECTED NOT DETECTED Final   Astrovirus NOT DETECTED NOT DETECTED Final   Norovirus GI/GII NOT DETECTED NOT DETECTED Final   Rotavirus A NOT DETECTED NOT DETECTED Final   Sapovirus (I, II, IV, and V) NOT DETECTED NOT DETECTED Final    Comment: Performed at [facility],  [place] [place], [place] [address]  MRSA Next Gen by PCR, Nasal     Status: None   Collection Time: 01/24/22  11:00 PM   Specimen: Nasal Mucosa; Nasal Swab  Result Value Ref Range Status   MRSA by PCR Next Gen NOT DETECTED NOT DETECTED Final    Comment: (NOTE) The GeneXpert MRSA Assay (FDA approved for NASAL specimens only), is one component of a comprehensive MRSA colonization surveillance program. It is not intended to diagnose MRSA infection nor to guide or monitor treatment for MRSA infections. Test performance is not FDA approved in patients less than 18 years old. Performed at [facility], [place] [address] [place], [place] [address]   C Difficile Quick Screen w PCR reflex     Status: None   Collection Time: 01/25/22  5:14 AM   Specimen: STOOL  Result Value Ref Range Status   C Diff antigen NEGATIVE NEGATIVE Final   C Diff toxin NEGATIVE NEGATIVE Final   C Diff interpretation No C. difficile detected.  Final    Comment: Performed at [facility], [place] [address] [place], [place] [address]  Culture, blood (Routine X 2) w Reflex to ID Panel     Status: None   Collection Time: 01/25/22  2:56 PM   Specimen: BLOOD  Result Value Ref Range Status   Specimen Description BLOOD SITE NOT SPECIFIED  Final   Special Requests   Final    BOTTLES DRAWN AEROBIC AND ANAEROBIC Blood Culture adequate volume   Culture   Final    NO GROWTH 5 DAYS Performed at [facility], [address], [place], [place] [address]    Report Status 01/30/2022 FINAL  Final  Culture, blood (Routine X 2) w Reflex to ID Panel     Status: None   Collection Time: 01/25/22  2:56 PM   Specimen: BLOOD  Result Value Ref Range Status   Specimen Description BLOOD SITE NOT SPECIFIED  Final   Special Requests   Final    BOTTLES DRAWN AEROBIC AND ANAEROBIC Blood Culture adequate volume   Culture   Final    NO GROWTH 5 DAYS Performed at [facility], [place] [address] [place], [place] [address]    Report Status 01/30/2022 FINAL  Final     Serology:   Imaging: If present, new imagings (plain films, ct  scans, and mri) have been personally visualized and interpreted; radiology reports have been reviewed. Decision making incorporated into the Impression / Recommendations.  11/27 tee 1. Left ventricular ejection fraction, by estimation, is 50 to 55%. The  left ventricle has low normal function.   2. Right ventricular systolic function is normal. The right ventricular  size is normal. There is normal pulmonary artery systolic pressure.   3. Left atrial size was moderately dilated. No left atrial/left atrial  appendage thrombus was detected.   4. The mitral valve is degenerative. Mild mitral valve regurgitation. No  evidence of mitral stenosis.   5. The aortic valve is tricuspid. There is mild calcification of the  aortic valve. There is mild thickening of the aortic valve. Aortic valve  regurgitation is not visualized. Aortic valve sclerosis is present, with  no evidence of aortic valve stenosis.   6. There is mild (Grade II) layered plaque involving the descending aorta  and aortic arch.   Conclusion(s)/Recommendation(s): No evidence of vegetation/infective  endocarditis on this transesophageael echocardiogram.   01/24/22 NM lung scan No pe  Jabier Mutton, [place] for Infectious [place] [phone] pager    01/30/2022, 1:58 PM

## 2022-01-30 NOTE — Progress Notes (Signed)
PROGRESS NOTE    Shelby Walker  ZOX:[id number] DOB: 02-22-1934 DOA: 01/24/2022 PCP: Reynold Bowen, MD  88/F with history of recurrent C. difficile colitis, chronic diastolic CHF, mitral regurgitation, CAD, paroxysmal A-fib, diabetes mellitus, psoriasis, rheumatoid arthritis, history of PAD/gangrene and transmetatarsal amputation 11/22, recently hospitalized with prolonged sinus pauses, underwent pacemaker implantation on 01/05/2022 and loop recorder removal -Admitted to the ICU 11/22 with septic shock, found to have MSSA bacteremia -Treated with fluid resuscitation, pressors, antibiotics, infectious disease consulted -Transferred from PCCM to [facility] service 11/24 11/24: EP consulted,  11/27: PPM removed  Subjective: -Feels tired this morning, some back pain, breathing better overall  Assessment and Plan:  SEPTIC SHOCK MSSA  bacteremia - poa -on CT abd perinephric stranding likely red herring.  Recent PPM on 11/3 for long sinus pauses, pacemaker site appears unremarkable -continue cefazolin, repeat blood cultures 11/23 negative so far, 2D echo without indication of endocarditis -Stable off pressors -ID following, considering MRI LS spine tomorrow -EP consult appreciated, s/p PPM explant 11/27 -Await ID input, place PICC line -Ambulate, PT OT eval  Transient pleuritic chest pain 11/26 -Resolved -She had a VQ scan few days ago with was unremarkable, monitor clinically   AKI on CKD2 -baseline creat around 1.2, peaked at 2.6 -in the setting of septic shock, now improving,  Hypokalemia -Replaced  Elevated troponin History of CAD and carotid artery disease -Medical management was recommended as outpatient in the setting of advanced age -Echo with preserved EF, no wall motion abnormalities -Coronary CTA 10/23 noted moderate (50-69%) mixed plaque in the left main and LAD.Can not rule out severe (>70%) splenosis in OM1 -avoid BB w/ recent long sinus pauses and allergic to statin -PCCM  discussed with cards 11/23, felt to be demand related in the setting of septic shock, echo with preserved EF, no WMA, no role for Anticoagulation/LHC with DIC, AKI, supportive care recommended, unclear why troponins were checked in the ICU  Thrombocytopenia DIC -In the setting of septic shock, bacteremia -Antibiotics as noted above -Improving  Paroxysmal atrial fibrillation -  -Now on amiodarone gtt., transitioning to p.o. 200 Mg twice daily X 5 days followed by 200 Mg daily until follow-up, family would like to hold off on anticoagulation at this time with frequent falls  Chronic diastolic CHF -Oral Lasix today echo with preserved EF   Rheumatoid arthritis steroid-dependent - Dr Amil Amen  -she was started on stress dose steroids in the ICU, resumed prednisone, on 4 Mg daily at baseline  H/o recurrent Cdiff -now on Abx as above -Denies diarrhea at this time, continue floraster    Chronic anemia - hgb 8-9gm% -At baseline, monitor    Chronic pain from RA-  -At baseline on gabapentin, tramadol, Tylenol 3  -Continue tramadol, gabapentin at lower dose   Diabetes mellitus with complications -Sliding scale insulin protocol   DVT prophylaxis: SCDs,  Code Status: DNR Family Communication: Discussed with daughter at bedside Disposition Plan: May need rehab, PT eval pending  Consultants: ID, EP   Procedures: 11/27, pacemaker removed  Antimicrobials:    Objective: Vitals:   01/30/22 0004 01/30/22 0451 01/30/22 0800 01/30/22 1100  BP: 109/87 (!) 140/65 (!) 180/66 (!) 157/52  Pulse: 97 75 79 73  Resp: '17 18 18 18  '$ Temp: 98.5 F (36.9 C) 98.4 F (36.9 C) 98.4 F (36.9 C) 98.6 F (37 C)  TempSrc: Oral Oral Oral Oral  SpO2: 96% 91% 94% 94%  Weight:      Height:  Intake/Output Summary (Last 24 hours) at 01/30/2022 1401 Last data filed at 01/30/2022 1158 Gross per 24 hour  Intake 919.84 ml  Output 1550 ml  Net -630.16 ml   Filed Weights   01/28/22 0339 01/29/22  0444 01/29/22 0910  Weight: 57.2 kg 57.4 kg 57.4 kg    Examination:  General exam: Elderly chronically ill female sitting up in bed, AAOx3, no distress HEENT: No JVD CVS: S1-S2, irregular Lungs: Decreased breath sounds at the bases Abdomen: Soft, nontender, bowel sounds present Extremities: Trace edema  Psychiatry: Flat affect    Data Reviewed:   CBC: Recent Labs  Lab 01/24/22 0935 01/24/22 0936 01/26/22 0522 01/27/22 0507 01/28/22 0117 01/29/22 0136 01/30/22 0127  WBC 18.2*   < > 16.0* 14.5* 10.3 11.2* 8.3  NEUTROABS 11.8*  --   --   --   --   --   --   HGB 9.1*   < > 8.0* 7.8* 7.9* 9.4* 9.5*  HCT 28.2*   < > 24.3* 23.7* 23.5* 28.6* 29.9*  MCV 94.9   < > 90.3 90.5 89.0 89.9 92.3  PLT 150   < > 108* 91* 93* 133* 142*   < > = values in this interval not displayed.   Basic Metabolic Panel: Recent Labs  Lab 01/25/22 0514 01/25/22 1021 01/25/22 1449 01/26/22 0522 01/27/22 0507 01/28/22 0117 01/29/22 0136 01/30/22 0127  NA 137 135   < > 142 135 144 143 143  K 4.8 4.8   < > 3.7 3.2* 2.5* 3.5 4.2  CL 105 103   < > 103 104 106 105 102  CO2 20* 18*   < > 21* 21* '26 28 26  '$ GLUCOSE 188* 183*   < > 151* 124* 210* 192* 174*  BUN 51* 50*   < > 54* 52* 45* 44* 36*  CREATININE 2.67* 2.53*   < > 2.26* 1.87* 1.61* 1.47* 1.35*  CALCIUM 7.3* 7.2*   < > 8.1* 7.2* 7.5* 7.5* 7.5*  MG 1.7 1.6*  --  2.9*  --   --   --   --   PHOS  --  5.7*  --  5.4*  --   --   --   --    < > = values in this interval not displayed.   GFR: Estimated Creatinine Clearance: 24.4 mL/min (A) (by C-G formula based on SCr of 1.35 mg/dL (H)). Liver Function Tests: Recent Labs  Lab 01/25/22 1021 01/27/22 0507 01/28/22 0117 01/29/22 0136  AST 250* 212* 113* 57*  ALT 155* 116* 68* 30  ALKPHOS 61 79 68 77  BILITOT 0.5 0.7 0.8 0.4  PROT 4.6* 5.3* 4.9* 5.3*  ALBUMIN 2.4* 2.4* 2.3* 2.5*   Recent Labs  Lab 01/25/22 1021  LIPASE 23   No results for input(s): "AMMONIA" in the last 168  hours. Coagulation Profile: Recent Labs  Lab 01/24/22 0935 01/25/22 0936  INR 1.2 2.0*   Cardiac Enzymes: No results for input(s): "CKTOTAL", "CKMB", "CKMBINDEX", "TROPONINI" in the last 168 hours. BNP (last 3 results) No results for input(s): "PROBNP" in the last 8760 hours. HbA1C: No results for input(s): "HGBA1C" in the last 72 hours.  CBG: Recent Labs  Lab 01/29/22 2003 01/30/22 0006 01/30/22 0454 01/30/22 0824 01/30/22 1145  GLUCAP 153* 162* 170* 188* 269*   Lipid Profile: No results for input(s): "CHOL", "HDL", "LDLCALC", "TRIG", "CHOLHDL", "LDLDIRECT" in the last 72 hours. Thyroid Function Tests: No results for input(s): "TSH", "T4TOTAL", "FREET4", "T3FREE", "THYROIDAB" in  the last 72 hours. Anemia Panel: No results for input(s): "VITAMINB12", "FOLATE", "FERRITIN", "TIBC", "IRON", "RETICCTPCT" in the last 72 hours. Urine analysis:    Component Value Date/Time   COLORURINE YELLOW 01/24/2022 0954   APPEARANCEUR HAZY (A) 01/24/2022 0954   LABSPEC 1.018 01/24/2022 0954   PHURINE 5.0 01/24/2022 0954   GLUCOSEU NEGATIVE 01/24/2022 0954   HGBUR NEGATIVE 01/24/2022 0954   BILIRUBINUR NEGATIVE 01/24/2022 0954   KETONESUR NEGATIVE 01/24/2022 0954   PROTEINUR 30 (A) 01/24/2022 0954   NITRITE NEGATIVE 01/24/2022 0954   LEUKOCYTESUR MODERATE (A) 01/24/2022 0954   Sepsis Labs: '@LABRCNTIP'$ (procalcitonin:4,lacticidven:4)  ) Recent Results (from the past 240 hour(s))  Resp Panel by RT-PCR (Flu A&B, Covid) Anterior Nasal Swab     Status: None   Collection Time: 01/24/22  9:23 AM   Specimen: Anterior Nasal Swab  Result Value Ref Range Status   SARS Coronavirus 2 by RT PCR NEGATIVE NEGATIVE Final    Comment: (NOTE) SARS-CoV-2 target nucleic acids are NOT DETECTED.  The SARS-CoV-2 RNA is generally detectable in upper respiratory specimens during the acute phase of infection. The lowest concentration of SARS-CoV-2 viral copies this assay can detect is 138 copies/mL. A  negative result does not preclude SARS-Cov-2 infection and should not be used as the sole basis for treatment or other patient management decisions. A negative result may occur with  improper specimen collection/handling, submission of specimen other than nasopharyngeal swab, presence of viral mutation(s) within the areas targeted by this assay, and inadequate number of viral copies(<138 copies/mL). A negative result must be combined with clinical observations, patient history, and epidemiological information. The expected result is Negative.  Fact Sheet for Patients:  EntrepreneurPulse.com.au  Fact Sheet for Healthcare Providers:  IncredibleEmployment.be  This test is no t yet approved or cleared by the [place] FDA and  has been authorized for detection and/or diagnosis of SARS-CoV-2 by FDA under an Emergency Use Authorization (EUA). This EUA will remain  in effect (meaning this test can be used) for the duration of the COVID-19 declaration under Section 564(b)(1) of the Act, 21 U.S.C.section 360bbb-3(b)(1), unless the authorization is terminated  or revoked sooner.       Influenza A by PCR NEGATIVE NEGATIVE Final   Influenza B by PCR NEGATIVE NEGATIVE Final    Comment: (NOTE) The Xpert Xpress SARS-CoV-2/FLU/RSV plus assay is intended as an aid in the diagnosis of influenza from Nasopharyngeal swab specimens and should not be used as a sole basis for treatment. Nasal washings and aspirates are unacceptable for Xpert Xpress SARS-CoV-2/FLU/RSV testing.  Fact Sheet for Patients: EntrepreneurPulse.com.au  Fact Sheet for Healthcare Providers: IncredibleEmployment.be  This test is not yet approved or cleared by the [place] FDA and has been authorized for detection and/or diagnosis of SARS-CoV-2 by FDA under an Emergency Use Authorization (EUA). This EUA will remain in effect (meaning this test can  be used) for the duration of the COVID-19 declaration under Section 564(b)(1) of the Act, 21 U.S.C. section 360bbb-3(b)(1), unless the authorization is terminated or revoked.  Performed at [facility], [place] [address], [place], [place] [address]   Blood Culture (routine x 2)     Status: Abnormal   Collection Time: 01/24/22  9:23 AM   Specimen: BLOOD  Result Value Ref Range Status   Specimen Description BLOOD SITE NOT SPECIFIED  Final   Special Requests   Final    BOTTLES DRAWN AEROBIC AND ANAEROBIC Blood Culture adequate volume   Culture  Setup Time  Final    GRAM POSITIVE COCCI IN PAIRS IN CLUSTERS IN BOTH AEROBIC AND ANAEROBIC BOTTLES CRITICAL RESULT CALLED TO, READ BACK BY AND VERIFIED WITH: T RUDISILL,PHARMD'@0004'$  01/25/22 [place] Performed at [facility], [place] [address] [place], [place] [address]    Culture STAPHYLOCOCCUS AUREUS (A)  Final   Report Status 01/26/2022 FINAL  Final   Organism ID, Bacteria STAPHYLOCOCCUS AUREUS  Final      Susceptibility   Staphylococcus aureus - MIC*    CIPROFLOXACIN <=0.5 SENSITIVE Sensitive     ERYTHROMYCIN <=0.25 SENSITIVE Sensitive     GENTAMICIN <=0.5 SENSITIVE Sensitive     OXACILLIN 0.5 SENSITIVE Sensitive     TETRACYCLINE <=1 SENSITIVE Sensitive     VANCOMYCIN <=0.5 SENSITIVE Sensitive     TRIMETH/SULFA <=10 SENSITIVE Sensitive     CLINDAMYCIN <=0.25 SENSITIVE Sensitive     RIFAMPIN <=0.5 SENSITIVE Sensitive     Inducible Clindamycin NEGATIVE Sensitive     * STAPHYLOCOCCUS AUREUS  Urine Culture     Status: Abnormal   Collection Time: 01/24/22  9:23 AM   Specimen: In/Out Cath Urine  Result Value Ref Range Status   Specimen Description IN/OUT CATH URINE  Final   Special Requests   Final    NONE Performed at [facility], [place] [address], [place], [place] [address]    Culture (A)  Final    40,000 COLONIES/mL ESCHERICHIA COLI 40,000 COLONIES/mL PSEUDOMONAS AERUGINOSA    Report Status 01/26/2022 FINAL  Final    Organism ID, Bacteria ESCHERICHIA COLI (A)  Final   Organism ID, Bacteria PSEUDOMONAS AERUGINOSA (A)  Final      Susceptibility   Escherichia coli - MIC*    AMPICILLIN 16 INTERMEDIATE Intermediate     CEFAZOLIN <=4 SENSITIVE Sensitive     CEFEPIME <=0.12 SENSITIVE Sensitive     CEFTRIAXONE <=0.25 SENSITIVE Sensitive     CIPROFLOXACIN <=0.25 SENSITIVE Sensitive     GENTAMICIN <=1 SENSITIVE Sensitive     IMIPENEM <=0.25 SENSITIVE Sensitive     NITROFURANTOIN 32 SENSITIVE Sensitive     TRIMETH/SULFA <=20 SENSITIVE Sensitive     AMPICILLIN/SULBACTAM 4 SENSITIVE Sensitive     PIP/TAZO <=4 SENSITIVE Sensitive     * 40,000 COLONIES/mL ESCHERICHIA COLI   Pseudomonas aeruginosa - MIC*    CEFTAZIDIME 2 SENSITIVE Sensitive     CIPROFLOXACIN 0.5 SENSITIVE Sensitive     GENTAMICIN <=1 SENSITIVE Sensitive     IMIPENEM 2 SENSITIVE Sensitive     PIP/TAZO 8 SENSITIVE Sensitive     CEFEPIME 4 SENSITIVE Sensitive     * 40,000 COLONIES/mL PSEUDOMONAS AERUGINOSA  Blood Culture ID Panel (Reflexed)     Status: Abnormal   Collection Time: 01/24/22  9:23 AM  Result Value Ref Range Status   Enterococcus faecalis NOT DETECTED NOT DETECTED Final   Enterococcus Faecium NOT DETECTED NOT DETECTED Final   Listeria monocytogenes NOT DETECTED NOT DETECTED Final   Staphylococcus species DETECTED (A) NOT DETECTED Final    Comment: CRITICAL RESULT CALLED TO, READ BACK BY AND VERIFIED WITH: T RUDISILL,PHARMD'@0004'$  01/25/22 [place]    Staphylococcus aureus (BCID) DETECTED (A) NOT DETECTED Final    Comment: CRITICAL RESULT CALLED TO, READ BACK BY AND VERIFIED WITH: T RUDISILL,PHARMD'@0004'$  01/25/22 [place]    Staphylococcus epidermidis NOT DETECTED NOT DETECTED Final   Staphylococcus lugdunensis NOT DETECTED NOT DETECTED Final   Streptococcus species NOT DETECTED NOT DETECTED Final   Streptococcus agalactiae NOT DETECTED NOT DETECTED Final   Streptococcus pneumoniae NOT DETECTED  NOT DETECTED Final   Streptococcus pyogenes NOT  DETECTED NOT DETECTED Final   A.calcoaceticus-baumannii NOT DETECTED NOT DETECTED Final   Bacteroides fragilis NOT DETECTED NOT DETECTED Final   Enterobacterales NOT DETECTED NOT DETECTED Final   Enterobacter cloacae complex NOT DETECTED NOT DETECTED Final   Escherichia coli NOT DETECTED NOT DETECTED Final   Klebsiella aerogenes NOT DETECTED NOT DETECTED Final   Klebsiella oxytoca NOT DETECTED NOT DETECTED Final   Klebsiella pneumoniae NOT DETECTED NOT DETECTED Final   Proteus species NOT DETECTED NOT DETECTED Final   Salmonella species NOT DETECTED NOT DETECTED Final   Serratia marcescens NOT DETECTED NOT DETECTED Final   Haemophilus influenzae NOT DETECTED NOT DETECTED Final   Neisseria meningitidis NOT DETECTED NOT DETECTED Final   Pseudomonas aeruginosa NOT DETECTED NOT DETECTED Final   Stenotrophomonas maltophilia NOT DETECTED NOT DETECTED Final   Candida albicans NOT DETECTED NOT DETECTED Final   Candida auris NOT DETECTED NOT DETECTED Final   Candida glabrata NOT DETECTED NOT DETECTED Final   Candida krusei NOT DETECTED NOT DETECTED Final   Candida parapsilosis NOT DETECTED NOT DETECTED Final   Candida tropicalis NOT DETECTED NOT DETECTED Final   Cryptococcus neoformans/gattii NOT DETECTED NOT DETECTED Final   Meth resistant mecA/C and MREJ NOT DETECTED NOT DETECTED Final    Comment: Performed at [facility], [address], [place], [place] [address]  Blood Culture (routine x 2)     Status: Abnormal   Collection Time: 01/24/22  9:28 AM   Specimen: BLOOD  Result Value Ref Range Status   Specimen Description BLOOD SITE NOT SPECIFIED  Final   Special Requests   Final    BOTTLES DRAWN AEROBIC AND ANAEROBIC Blood Culture results may not be optimal due to an excessive volume of blood received in culture bottles   Culture  Setup Time   Final    GRAM POSITIVE COCCI IN PAIRS IN BOTH AEROBIC AND ANAEROBIC BOTTLES CRITICAL VALUE NOTED.  VALUE IS CONSISTENT WITH PREVIOUSLY  REPORTED AND CALLED VALUE.    Culture (A)  Final    STAPHYLOCOCCUS AUREUS SUSCEPTIBILITIES PERFORMED ON PREVIOUS CULTURE WITHIN THE LAST 5 DAYS. Performed at [facility], [place] [address] [place], [place] [address]    Report Status 01/26/2022 FINAL  Final  Gastrointestinal Panel by PCR , Stool     Status: None   Collection Time: 01/24/22 10:36 AM   Specimen: Stool  Result Value Ref Range Status   Campylobacter species NOT DETECTED NOT DETECTED Final   Plesimonas shigelloides NOT DETECTED NOT DETECTED Final   Salmonella species NOT DETECTED NOT DETECTED Final   Yersinia enterocolitica NOT DETECTED NOT DETECTED Final   Vibrio species NOT DETECTED NOT DETECTED Final   Vibrio cholerae NOT DETECTED NOT DETECTED Final   Enteroaggregative E coli (EAEC) NOT DETECTED NOT DETECTED Final   Enteropathogenic E coli (EPEC) NOT DETECTED NOT DETECTED Final   Enterotoxigenic E coli (ETEC) NOT DETECTED NOT DETECTED Final   Shiga like toxin producing E coli (STEC) NOT DETECTED NOT DETECTED Final   Shigella/Enteroinvasive E coli (EIEC) NOT DETECTED NOT DETECTED Final   Cryptosporidium NOT DETECTED NOT DETECTED Final   Cyclospora cayetanensis NOT DETECTED NOT DETECTED Final   Entamoeba histolytica NOT DETECTED NOT DETECTED Final   Giardia lamblia NOT DETECTED NOT DETECTED Final   Adenovirus F40/41 NOT DETECTED NOT DETECTED Final   Astrovirus NOT DETECTED NOT DETECTED Final   Norovirus GI/GII NOT DETECTED NOT DETECTED Final   Rotavirus A  NOT DETECTED NOT DETECTED Final   Sapovirus (I, II, IV, and V) NOT DETECTED NOT DETECTED Final    Comment: Performed at [facility], [place] [place], [place] [address]  MRSA Next Gen by PCR, Nasal     Status: None   Collection Time: 01/24/22 11:00 PM   Specimen: Nasal Mucosa; Nasal Swab  Result Value Ref Range Status   MRSA by PCR Next Gen NOT DETECTED NOT DETECTED Final    Comment: (NOTE) The GeneXpert MRSA Assay (FDA approved for NASAL  specimens only), is one component of a comprehensive MRSA colonization surveillance program. It is not intended to diagnose MRSA infection nor to guide or monitor treatment for MRSA infections. Test performance is not FDA approved in patients less than 43 years old. Performed at [facility], [place] [address] [place], [place] [address]   C Difficile Quick Screen w PCR reflex     Status: None   Collection Time: 01/25/22  5:14 AM   Specimen: STOOL  Result Value Ref Range Status   C Diff antigen NEGATIVE NEGATIVE Final   C Diff toxin NEGATIVE NEGATIVE Final   C Diff interpretation No C. difficile detected.  Final    Comment: Performed at [facility], [place] [address] [place], [place] [address]  Culture, blood (Routine X 2) w Reflex to ID Panel     Status: None   Collection Time: 01/25/22  2:56 PM   Specimen: BLOOD  Result Value Ref Range Status   Specimen Description BLOOD SITE NOT SPECIFIED  Final   Special Requests   Final    BOTTLES DRAWN AEROBIC AND ANAEROBIC Blood Culture adequate volume   Culture   Final    NO GROWTH 5 DAYS Performed at [facility], [address] [place], [place] [address]    Report Status 01/30/2022 FINAL  Final  Culture, blood (Routine X 2) w Reflex to ID Panel     Status: None   Collection Time: 01/25/22  2:56 PM   Specimen: BLOOD  Result Value Ref Range Status   Specimen Description BLOOD SITE NOT SPECIFIED  Final   Special Requests   Final    BOTTLES DRAWN AEROBIC AND ANAEROBIC Blood Culture adequate volume   Culture   Final    NO GROWTH 5 DAYS Performed at [facility], [place] [address] [place], [place] [address]    Report Status 01/30/2022 FINAL  Final     Radiology Studies: [place] EKG SITE RITE  Result Date: 01/30/2022 If Site Rite image not attached, placement could not be confirmed due to current cardiac rhythm.  ECHO TEE  Result Date: 01/29/2022    TRANSESOPHOGEAL ECHO REPORT   Patient Name:   Shelby Walker Date of  Exam: 01/29/2022 Medical Rec #:  [id number]   Height:       61.0 in Accession #:    [id number]  Weight:       126.5 lb Date of Birth:  January 02, 1934   BSA:          1.555 m Patient Age:    39 years    BP:           165/72 mmHg Patient Gender: F           HR:           82 bpm. Exam Location:  Inpatient Procedure: Transesophageal Echo, Cardiac Doppler, Color Doppler and 3D Echo Indications:     Endocarditis  History:         Patient has prior history of Echocardiogram examinations, most                  recent 01/25/2022. Pacemaker, Mitral Valve Disease; Risk                  Factors:Dyslipidemia and Diabetes.  Sonographer:     Darlina Sicilian RDCS Referring Phys:  [id number] [place] Diagnosing Phys: Eleonore Chiquito MD PROCEDURE: After discussion of the risks and benefits of a TEE, an informed consent was obtained from the patient. TEE procedure time was 14 minutes. The transesophogeal probe was passed without difficulty through the esophogus of the patient. Imaged were obtained with the patient in a left lateral decubitus position. Local oropharyngeal anesthetic was provided with Benzocaine spray. Sedation performed by different physician. The patient was monitored while under deep sedation. Anesthestetic sedation  was provided intravenously by Anesthesiology: '107mg'$  of Propofol. Image quality was excellent. The patient's vital signs; including heart rate, blood pressure, and oxygen saturation; remained stable throughout the procedure. The patient developed no complications during the procedure.  IMPRESSIONS  1. Left ventricular ejection fraction, by estimation, is 50 to 55%. The left ventricle has low normal function.  2. Right ventricular systolic function is normal. The right ventricular size is normal. There is normal pulmonary artery systolic pressure.  3. Left atrial size was moderately dilated. No left atrial/left atrial appendage thrombus was detected.  4. The mitral valve is degenerative. Mild mitral valve  regurgitation. No evidence of mitral stenosis.  5. The aortic valve is tricuspid. There is mild calcification of the aortic valve. There is mild thickening of the aortic valve. Aortic valve regurgitation is not visualized. Aortic valve sclerosis is present, with no evidence of aortic valve stenosis.  6. There is mild (Grade II) layered plaque involving the descending aorta and aortic arch. Conclusion(s)/Recommendation(s): No evidence of vegetation/infective endocarditis on this transesophageael echocardiogram. FINDINGS  Left Ventricle: Left ventricular ejection fraction, by estimation, is 50 to 55%. The left ventricle has low normal function. The left ventricular internal cavity size was normal in size. There is no left ventricular hypertrophy. Right Ventricle: The right ventricular size is normal. No increase in right ventricular wall thickness. Right ventricular systolic function is normal. There is normal pulmonary artery systolic pressure. Left Atrium: Left atrial size was moderately dilated. No left atrial/left atrial appendage thrombus was detected. Right Atrium: Right atrial size was normal in size. Pericardium: There is no evidence of pericardial effusion. Mitral Valve: The mitral valve is degenerative in appearance. There is moderate calcification of the mitral valve leaflet(s). Mildly decreased mobility of the mitral valve leaflets. Mild to moderate mitral annular calcification. Mild mitral valve regurgitation. No evidence of mitral valve stenosis. MV peak gradient, 8.2 mmHg. The mean mitral valve gradient is 3.0 mmHg with average heart rate of 70 bpm. There is no evidence of mitral valve vegetation. Tricuspid Valve: The tricuspid valve is grossly normal. Tricuspid valve regurgitation is mild . No evidence of tricuspid stenosis. There is no evidence of tricuspid valve vegetation. Aortic Valve: The aortic valve is tricuspid. There is mild calcification of the aortic valve. There is mild thickening of the  aortic valve. There is mild aortic valve annular calcification. Aortic valve regurgitation is not visualized. Aortic valve sclerosis is present, with no evidence of aortic valve stenosis. There is no evidence of aortic valve vegetation. Pulmonic Valve: The pulmonic valve was grossly normal. Pulmonic valve regurgitation is not  visualized. No evidence of pulmonic stenosis. There is no evidence of pulmonic valve vegetation. Aorta: The aortic root and ascending aorta are structurally normal, with no evidence of dilitation. There is mild (Grade II) layered plaque involving the descending aorta and aortic arch. Venous: The right lower pulmonary vein, right upper pulmonary vein, left upper pulmonary vein and left lower pulmonary vein are normal. IAS/Shunts: No atrial level shunt detected by color flow Doppler. Additional Comments: A device lead is visualized in the right atrium, right ventricle and superior vena cava. Spectral Doppler performed. LEFT VENTRICLE PLAX 2D LVOT diam:     2.00 cm LVOT Area:     3.14 cm   AORTA Ao Root diam: 2.68 cm Ao Asc diam:  3.00 cm MITRAL VALVE            TRICUSPID VALVE MV Peak grad: 8.2 mmHg  TR Peak grad:   25.0 mmHg MV Mean grad: 3.0 mmHg  TR Vmax:        250.00 cm/s MV Vmax:      1.43 m/s MV Vmean:     87.0 cm/s SHUNTS                         Systemic Diam: 2.00 cm Eleonore Chiquito MD Electronically signed by Eleonore Chiquito MD Signature Date/Time: 01/29/2022/2:41:37 PM    Final    DG CHEST PORT 1 VIEW  Result Date: 01/29/2022 CLINICAL DATA:  Status post transesophageal ECHO and pacemaker removal EXAM: PORTABLE CHEST 1 VIEW COMPARISON:  Chest radiograph dated 01/25/2022 FINDINGS: Lines/tubes: Interval removal of left IJ central venous catheter and left chest wall pacemaker. Chest: Persistent bilateral interstitial and bibasilar hazy opacities and dense left retrocardiac opacity. Pleura: Similar small bilateral pleural effusions. New vertically oriented curvilinear interface traversing  the lateral left lung is favored to represent a skin fold. No definite pneumothorax. Heart/mediastinum: Similar cardiomediastinal silhouette. w Aortic atherosclerosis. Bones: Old right lateral seventh rib fracture. IMPRESSION: 1. Interval removal of left IJ central venous catheter and left chest wall pacemaker. 2. Persistent left retrocardiac atelectasis and mild interstitial edema. 3. Similar small bilateral pleural effusions. 4. Vertically oriented curvilinear interface traversing the lateral left lung is favored to represent a skin fold. No definite pneumothorax. Attention on follow-up. Electronically Signed   By: Darrin Nipper M.D.   On: 01/29/2022 13:25   EP PPM/ICD IMPLANT  Result Date: 01/29/2022 Conclusion: Successful removal of a previously implanted dual-chamber pacemaker in a patient with a history of sinus node dysfunction and who was found to have staph sepsis.  There was no evidence of infection on transesophageal echo or in the pocket but guideline directed therapy strongly suggest removal of the device which was carried out without complication. Cristopher Peru, MD     Scheduled Meds:  amiodarone  200 mg Oral BID   Followed by   Derrill Memo ON 02/04/2022] amiodarone  200 mg Oral Daily   amLODipine  10 mg Oral Daily   Chlorhexidine Gluconate Cloth  6 each Topical Daily   insulin aspart  0-9 Units Subcutaneous Q4H   lactose free nutrition  237 mL Oral BID BM   lidocaine  1 patch Transdermal Q24H   multivitamin  1 tablet Oral QHS   mouth rinse  15 mL Mouth Rinse 4 times per day   pantoprazole  40 mg Oral Daily   predniSONE  5 mg Oral Q breakfast   saccharomyces boulardii  250 mg Oral BID   sodium chloride flush  3 mL Intravenous Q12H   Continuous Infusions:  sodium chloride 20 mL/hr at 01/30/22 0401   sodium chloride 20 mL/hr at 01/30/22 0401    ceFAZolin (ANCEF) IV 2 g (01/30/22 0835)     LOS: 6 days    Time spent: 74mn  PDomenic Polite MD [facility]   01/30/2022,  2:01 PM

## 2022-01-30 NOTE — Progress Notes (Signed)
Mobility Specialist Progress Note:   01/30/22 1503  Mobility  Activity Transferred from chair to bed  Level of Assistance Contact guard assist, steadying assist  Assistive Device Front wheel walker  Distance Ambulated (ft) 2 ft  Activity Response Tolerated well  $Mobility charge 1 Mobility   Pt in chair needing to get back to bed. Complaints of "not feeling well". Left EOB with call bell in reach, all needs met and daughter present.   Gareth Eagle Virginio Isidore Mobility Specialist Please contact via [employer] or  Rehab Office at [phone]

## 2022-01-30 NOTE — Evaluation (Addendum)
Addendum 13:24 - Updated d/c recs from SNF to AIR after social worker reached out and reported her family could provide as much assistance as needed for an AIR admit. Initially, it was understood that the family could only be available PRN.  Moishe Spice, PT, DPT Acute Rehabilitation Services  Office: [phone]   Physical Therapy Evaluation Patient Details Name: Shelby Walker MRN: [id number] DOB: 1933-11-17 Today's Date: 01/30/2022  History of Present Illness  88 year old female with chest pains took nitroglycerin prior to going to sleep and difficult to rouse the next morning, brought to ED 11/22 and found to have fever of 103, admitted to ICU for septic shock due to staph bacteria. S/p pacemaker removal 11/27. PMH: CAD, diastolic function, W4YK, HLD, P A-fib, PAD, CKD, RA and pacemaker, s/p transmetatarsal amputation 11/22   Clinical Impression  Pt presents with condition above and deficits mentioned below, see PT Problem List. PTA, she was living alone in a house. Pt was mod I using a RW for mobility. Currently, pt is demonstrating deficits in memory, processing speed, attention, arousal, balance, strength, and activity tolerance. She is limited by nausea, having x2 small bouts of emesis during the session, but was still motivated to continue to progress with mobility. She required minA for bed mobility and to transfer to stand and step bed > recliner using a RW. She is at risk for falls and demonstrating deficits in cognition that would place her at risk for injury, thus it would be unsafe for her to be home alone at this time. Considering pt's family is reporting an ability to provide the level of care likely needed for an AIR admit and pt is motivated to participate despite feeling unwell, recommending AIR. Will continue to follow acutely.    Recommendations for follow up therapy are one component of a multi-disciplinary discharge planning process, led by the attending physician.   Recommendations may be updated based on patient status, additional functional criteria and insurance authorization.  Follow Up Recommendations Acute inpatient rehab (3hours/day)     Assistance Recommended at Discharge Frequent or constant Supervision/Assistance  Patient can return home with the following  A little help with walking and/or transfers;A little help with bathing/dressing/bathroom;Assistance with cooking/housework;Direct supervision/assist for medications management;Direct supervision/assist for financial management;Assist for transportation;Help with stairs or ramp for entrance    Equipment Recommendations None recommended by PT  Recommendations for Other Services    Rehab Consult   Functional Status Assessment Patient has had a recent decline in their functional status and demonstrates the ability to make significant improvements in function in a reasonable and predictable amount of time.     Precautions / Restrictions Precautions Precautions: Fall;Other (comment) Precaution Comments: watch vitals Restrictions Weight Bearing Restrictions: No      Mobility  Bed Mobility Overal bed mobility: Needs Assistance Bed Mobility: Supine to Sit     Supine to sit: Min assist, +2 for safety/equipment, HOB elevated     General bed mobility comments: MinA to manage legs off EOB and assist in ascending her trunk to sit R EOB, +2 for safety. Extra time required to scoot to edge with minA.    Transfers Overall transfer level: Needs assistance Equipment used: Rolling walker (2 wheels) Transfers: Sit to/from Stand, Bed to chair/wheelchair/BSC Sit to Stand: Min assist, +2 safety/equipment   Step pivot transfers: Min assist, +2 safety/equipment       General transfer comment: MinA, +2 for safety, to power up to stand from EOB and step to R bed >  recliner, cuing pt to scoot to edge of bed first and push through R UE off bed surface.    Ambulation/Gait Ambulation/Gait  assistance: Min assist, +2 safety/equipment Gait Distance (Feet): 3 Feet Assistive device: Rolling walker (2 wheels) Gait Pattern/deviations: Step-through pattern, Decreased stride length, Leaning posteriorly Gait velocity: reduced Gait velocity interpretation: <1.31 ft/sec, indicative of household ambulator   General Gait Details: Pt with slow, small, unsteady steps to R bed > recliner using RW, leaning posteriorly slightly. MinA for stability, +2 for safety.  Stairs            Wheelchair Mobility    Modified Rankin (Stroke Patients Only)       Balance Overall balance assessment: Needs assistance Sitting-balance support: No upper extremity supported, Feet supported Sitting balance-Leahy Scale: Fair Sitting balance - Comments: Static sitting EOB with supervision for safety   Standing balance support: Bilateral upper extremity supported, During functional activity, Reliant on assistive device for balance Standing balance-Leahy Scale: Poor Standing balance comment: Reliant on RW and up to minA                             Pertinent Vitals/Pain Pain Assessment Pain Assessment: Faces Faces Pain Scale: Hurts little more Pain Location: abdomen, back Pain Descriptors / Indicators: Discomfort, Grimacing Pain Intervention(s): Limited activity within patient's tolerance, Monitored during session, Repositioned    Home Living Family/patient expects to be discharged to:: Private residence Living Arrangements: Alone Available Help at Discharge: Family;Available PRN/intermittently Type of Home: House Home Access: Stairs to enter;Other (comment) (has stair lift)       Home Layout: Laundry or work area in basement;Able to live on main level with bedroom/bathroom Home Equipment: Conservation officer, nature (2 wheels);Shower seat Additional Comments: entry 01/25/21: pt had walk-in shower and handicapped height toilet    Prior Function Prior Level of Function : Needs assist              Mobility Comments: Mod I using RW ADLs Comments: Daughter helps with medication management and does laundry etc     Hand Dominance   Dominant Hand: Right    Extremity/Trunk Assessment   Upper Extremity Assessment Upper Extremity Assessment: Defer to OT evaluation    Lower Extremity Assessment Lower Extremity Assessment: Generalized weakness    Cervical / Trunk Assessment Cervical / Trunk Assessment: Kyphotic  Communication   Communication: No difficulties  Cognition Arousal/Alertness: Lethargic, Awake/alert Behavior During Therapy: Flat affect Overall Cognitive Status: No family/caregiver present to determine baseline cognitive functioning                                 General Comments: Pt slow to process information, but also appeared to be HOH. Pt just had pacemaker removed, thus cued her to limit L UE pushing/utilization to promote healing, but pt noncompliant. Pt closing eyes, appearing to be lethargic during session, but easy to arouse. Pt nauseated with x2 small bouts of emesis, but still willing to participate. Pt could not recall some info in regards to her home set-up or would provide confilicting info intermittently. No family present to confirm prior cog status.        General Comments General comments (skin integrity, edema, etc.): BP elevated prior to and throughout session, but did decrease some by end of session, RN aware, otherwise VSS on RA    Exercises     Assessment/Plan  PT Assessment Patient needs continued PT services  PT Problem List Decreased strength;Decreased activity tolerance;Decreased balance;Decreased mobility;Decreased cognition;Cardiopulmonary status limiting activity       PT Treatment Interventions DME instruction;Gait training;Functional mobility training;Therapeutic activities;Therapeutic exercise;Balance training;Cognitive remediation;Neuromuscular re-education;Patient/family education    PT Goals (Current  goals can be found in the Care Plan section)  Acute Rehab PT Goals Patient Stated Goal: to feel better PT Goal Formulation: With patient Time For Goal Achievement: 02/13/22 Potential to Achieve Goals: Good    Frequency Min 3X/week     Co-evaluation PT/OT/SLP Co-Evaluation/Treatment: Yes Reason for Co-Treatment: Necessary to address cognition/behavior during functional activity;For patient/therapist safety;To address functional/ADL transfers PT goals addressed during session: Mobility/safety with mobility;Balance;Proper use of DME         AM-PAC PT "6 Clicks" Mobility  Outcome Measure Help needed turning from your back to your side while in a flat bed without using bedrails?: A Little Help needed moving from lying on your back to sitting on the side of a flat bed without using bedrails?: A Little Help needed moving to and from a bed to a chair (including a wheelchair)?: A Little Help needed standing up from a chair using your arms (e.g., wheelchair or bedside chair)?: A Little Help needed to walk in hospital room?: Total Help needed climbing 3-5 steps with a railing? : Total 6 Click Score: 14    End of Session Equipment Utilized During Treatment: Gait belt Activity Tolerance: Patient tolerated treatment well;Other (comment) (limited by nausea and x2 small bouts of emesis) Patient left: in chair;with call bell/phone within reach;with chair alarm set Nurse Communication: Mobility status;Other (comment) (BP; nausea) PT Visit Diagnosis: Unsteadiness on feet (R26.81);Other abnormalities of gait and mobility (R26.89);Muscle weakness (generalized) (M62.81);Difficulty in walking, not elsewhere classified (R26.2)    Time: 7989-2119 PT Time Calculation (min) (ACUTE ONLY): 31 min   Charges:   PT Evaluation $PT Eval Moderate Complexity: 1 Mod          Moishe Spice, PT, DPT Acute Rehabilitation Services  Office: [phone]   Orvan Falconer 01/30/2022, 9:29 AM

## 2022-01-30 NOTE — Progress Notes (Signed)
Nurse tech was giving patient bath when noted pustules on patient buttocks. This RN assessed and had charge nurse also put eyes on patient. Paged MD Broadus John and called [employer]. Patient was moved to airborne room and placed on precautions. Will continue to monitor.  Shelby Walker

## 2022-01-30 NOTE — Evaluation (Addendum)
Occupational Therapy Evaluation Patient Details Name: Shelby Walker MRN: [id number] DOB: [birth year] Today's Date: 01/30/2022   History of Present Illness 88 year old female with chest pains took nitroglycerin prior to going to sleep and difficult to rouse the next morning, brought to ED 11/22 and found to have fever of 103, admitted to ICU for septic shock due to staph bacteria. S/p pacemaker removal 11/27. PMH: CAD, diastolic function, P6PP, HLD, P A-fib, PAD, CKD, RA and pacemaker, s/p transmetatarsal amputation 01/2021   Clinical Impression   Pt reports independence at baseline with ADLs and uses RW for mobility, lives alone but has daughter who comes over daily to assist with ADLs. Pt currently needing min A -mod A for ADLs, min A +2 for bed mobility and min A +2 for step pivot transfer with RW. Pt with elevated BP and +emesis during session, RN notified. Pt presenting with impairments listed below, will follow acutely. Recommend AIR at d/c.     Recommendations for follow up therapy are one component of a multi-disciplinary discharge planning process, led by the attending physician.  Recommendations may be updated based on patient status, additional functional criteria and insurance authorization.   Follow Up Recommendations   Acute inpatient rehab (3hours/day)    Assistance Recommended at Discharge Frequent or constant Supervision/Assistance  Patient can return home with the following A little help with walking and/or transfers;A little help with bathing/dressing/bathroom;Assistance with cooking/housework;Direct supervision/assist for medications management;Direct supervision/assist for financial management;Assist for transportation;Help with stairs or ramp for entrance    Functional Status Assessment  Patient has had a recent decline in their functional status and demonstrates the ability to make significant improvements in function in a reasonable and predictable amount of time.   Equipment Recommendations  BSC/3in1    Recommendations for Other Services PT consult     Precautions / Restrictions Precautions Precautions: Fall;Other (comment) Precaution Comments: watch vitals Restrictions Weight Bearing Restrictions: No      Mobility Bed Mobility Overal bed mobility: Needs Assistance Bed Mobility: Supine to Sit     Supine to sit: Min assist, +2 for safety/equipment, HOB elevated          Transfers Overall transfer level: Needs assistance Equipment used: Rolling walker (2 wheels) Transfers: Sit to/from Stand, Bed to chair/wheelchair/BSC Sit to Stand: Min assist, +2 safety/equipment     Step pivot transfers: Min assist, +2 safety/equipment     General transfer comment: MinA, +2 for safety, to power up to stand from EOB and step to R bed > recliner, cuing pt to scoot to edge of bed first and push through R UE off bed surface.      Balance Overall balance assessment: Needs assistance Sitting-balance support: No upper extremity supported, Feet supported Sitting balance-Leahy Scale: Fair Sitting balance - Comments: Static sitting EOB with supervision for safety   Standing balance support: Bilateral upper extremity supported, During functional activity, Reliant on assistive device for balance Standing balance-Leahy Scale: Poor Standing balance comment: Reliant on RW and up to minA                           ADL either performed or assessed with clinical judgement   ADL Overall ADL's : Needs assistance/impaired Eating/Feeding: Set up   Grooming: Minimal assistance   Upper Body Bathing: Minimal assistance   Lower Body Bathing: Moderate assistance   Upper Body Dressing : Minimal assistance   Lower Body Dressing: Moderate assistance   Toilet Transfer: Minimal assistance  Toileting- Clothing Manipulation and Hygiene: Minimal assistance   Tub/ Banker: Minimal assistance   Functional mobility during ADLs: Minimal  assistance;Rolling walker (2 wheels)       Vision   Vision Assessment?: No apparent visual deficits     Perception Perception Perception Tested?: No   Praxis Praxis Praxis tested?: Not tested    Pertinent Vitals/Pain Pain Assessment Pain Assessment: Faces Pain Score: 4  Faces Pain Scale: Hurts little more Pain Location: abdomen, back Pain Descriptors / Indicators: Discomfort, Grimacing Pain Intervention(s): Limited activity within patient's tolerance, Monitored during session, Repositioned     Hand Dominance Right   Extremity/Trunk Assessment Upper Extremity Assessment Upper Extremity Assessment: Generalized weakness (hx of RA)   Lower Extremity Assessment Lower Extremity Assessment: Defer to PT evaluation   Cervical / Trunk Assessment Cervical / Trunk Assessment: Kyphotic   Communication Communication Communication: No difficulties   Cognition Arousal/Alertness: Lethargic, Awake/alert Behavior During Therapy: Flat affect Overall Cognitive Status: No family/caregiver present to determine baseline cognitive functioning                                 General Comments: able to state PLOF and home setup, following commands appropriately, slowly with increased time     General Comments  BP elevated, pt with +emesis sitting EOB, reporting increased nausea, RN aware    Exercises     Shoulder Instructions      Home Living Family/patient expects to be discharged to:: Private residence Living Arrangements: Alone Available Help at Discharge: Family;Available PRN/intermittently Type of Home: House Home Access: Stairs to enter;Other (comment) (has stair lift)     Home Layout: Laundry or work area in basement;Able to live on main level with bedroom/bathroom     Bathroom Shower/Tub: Hospital doctor Toilet: Handicapped height     Home Equipment: Conservation officer, nature (2 wheels);Shower seat   Additional Comments: entry 01/25/21: pt had walk-in  shower and handicapped height toilet      Prior Functioning/Environment Prior Level of Function : Needs assist             Mobility Comments: Mod I using RW ADLs Comments: Daughter helps with medication management and IADLs        OT Problem List: Decreased strength;Decreased range of motion;Decreased activity tolerance;Impaired balance (sitting and/or standing);Decreased safety awareness      OT Treatment/Interventions: Self-care/ADL training;Therapeutic exercise;Energy conservation;DME and/or AE instruction;Therapeutic activities;Patient/family education;Balance training    OT Goals(Current goals can be found in the care plan section) Acute Rehab OT Goals Patient Stated Goal: none stated OT Goal Formulation: With patient Time For Goal Achievement: 02/13/22 Potential to Achieve Goals: Good ADL Goals Pt Will Perform Grooming: with modified independence;standing Pt Will Perform Upper Body Dressing: with supervision;sitting Pt Will Perform Lower Body Dressing: with supervision;sit to/from stand Pt Will Transfer to Toilet: with supervision;ambulating;regular height toilet Pt Will Perform Tub/Shower Transfer: Shower transfer;with supervision;ambulating;shower seat  OT Frequency: Min 2X/week    Co-evaluation PT/OT/SLP Co-Evaluation/Treatment: Yes Reason for Co-Treatment: Complexity of the patient's impairments (multi-system involvement);For patient/therapist safety;To address functional/ADL transfers PT goals addressed during session: Mobility/safety with mobility;Balance;Proper use of DME OT goals addressed during session: ADL's and self-care      AM-PAC OT "6 Clicks" Daily Activity     Outcome Measure Help from another person eating meals?: A Little Help from another person taking care of personal grooming?: A Little Help from another person toileting, which includes using  toliet, bedpan, or urinal?: A Little Help from another person bathing (including washing, rinsing,  drying)?: A Lot Help from another person to put on and taking off regular upper body clothing?: A Little Help from another person to put on and taking off regular lower body clothing?: A Lot 6 Click Score: 16   End of Session Equipment Utilized During Treatment: Gait belt;Rolling walker (2 wheels) Nurse Communication: Mobility status  Activity Tolerance: Patient tolerated treatment well Patient left: in chair;with call bell/phone within reach;with chair alarm set  OT Visit Diagnosis: Unsteadiness on feet (R26.81);Other abnormalities of gait and mobility (R26.89);Muscle weakness (generalized) (M62.81)                Time: 2257-5051 OT Time Calculation (min): 18 min Charges:  OT General Charges $OT Visit: 1 Visit OT Evaluation $OT Eval Moderate Complexity: 1 Mod  Lathaniel Legate K, OTD, OTR/L [employer] Preferred Acute Rehab [phone]   Nakshatra Klose K Koonce 01/30/2022, 9:45 AM

## 2022-01-30 NOTE — Progress Notes (Addendum)
Electrophysiology Rounding Note  Patient Name: Shelby Walker Date of Encounter: 01/30/2022  Primary Cardiologist: Mertie Moores, MD Electrophysiologist: Virl Axe, MD   Subjective   No new complaints  Inpatient Medications    Scheduled Meds:  amLODipine  10 mg Oral Daily   Chlorhexidine Gluconate Cloth  6 each Topical Daily   furosemide  40 mg Intravenous Once   insulin aspart  0-9 Units Subcutaneous Q4H   lactose free nutrition  237 mL Oral BID BM   lidocaine  1 patch Transdermal Q24H   multivitamin  1 tablet Oral QHS   mouth rinse  15 mL Mouth Rinse 4 times per day   pantoprazole  40 mg Oral Daily   predniSONE  5 mg Oral Q breakfast   saccharomyces boulardii  250 mg Oral BID   sodium chloride flush  3 mL Intravenous Q12H   Continuous Infusions:  sodium chloride 20 mL/hr at 01/30/22 0401   sodium chloride 20 mL/hr at 01/30/22 0401   amiodarone 30 mg/hr (01/30/22 0400)    ceFAZolin (ANCEF) IV 2 g (01/30/22 0835)   PRN Meds: acetaminophen, docusate sodium, hydrALAZINE, HYDROmorphone (DILAUDID) injection, ondansetron (ZOFRAN) IV, mouth rinse, polyethylene glycol   Vital Signs    Vitals:   01/29/22 2000 01/30/22 0004 01/30/22 0451 01/30/22 0800  BP: (!) 155/96 109/87 (!) 140/65 (!) 180/66  Pulse:  97 75 79  Resp: '18 17 18 18  '$ Temp: 98.4 F (36.9 C) 98.5 F (36.9 C) 98.4 F (36.9 C) 98.4 F (36.9 C)  TempSrc: Oral Oral Oral Oral  SpO2:  96% 91% 94%  Weight:      Height:        Intake/Output Summary (Last 24 hours) at 01/30/2022 0836 Last data filed at 01/30/2022 0641 Gross per 24 hour  Intake 1259.84 ml  Output 1100 ml  Net 159.84 ml   Filed Weights   01/28/22 0339 01/29/22 0444 01/29/22 0910  Weight: 57.2 kg 57.4 kg 57.4 kg    Physical Exam    GEN- The patient is well appearing, alert and oriented x 3 today.   Head- normocephalic, atraumatic Eyes-  Sclera clear, conjunctiva pink Ears- hearing intact Oropharynx- clear Neck- supple Lungs-  Clear to ausculation bilaterally, normal work of breathing Heart- Regular rate and rhythm, no murmurs, rubs or gallops GI- soft, NT, ND, + BS Extremities- no clubbing or cyanosis. No edema Skin- no rash or lesion Psych- euthymic mood, full affect Neuro- strength and sensation are intact  Labs    CBC Recent Labs    01/29/22 0136 01/30/22 0127  WBC 11.2* 8.3  HGB 9.4* 9.5*  HCT 28.6* 29.9*  MCV 89.9 92.3  PLT 133* 253*   Basic Metabolic Panel Recent Labs    01/29/22 0136 01/30/22 0127  NA 143 143  K 3.5 4.2  CL 105 102  CO2 28 26  GLUCOSE 192* 174*  BUN 44* 36*  CREATININE 1.47* 1.35*  CALCIUM 7.5* 7.5*   Liver Function Tests Recent Labs    01/28/22 0117 01/29/22 0136  AST 113* 57*  ALT 68* 30  ALKPHOS 68 77  BILITOT 0.8 0.4  PROT 4.9* 5.3*  ALBUMIN 2.3* 2.5*   No results for input(s): "LIPASE", "AMYLASE" in the last 72 hours. Cardiac Enzymes No results for input(s): "CKTOTAL", "CKMB", "CKMBINDEX", "TROPONINI" in the last 72 hours.   Telemetry    NSR 70-90s, no further AF this am (personally reviewed)  Radiology    ECHO TEE  Result Date: 01/29/2022  TRANSESOPHOGEAL ECHO REPORT   Patient Name:   Shelby Walker Date of Exam: 01/29/2022 Medical Rec #:  [id number]   Height:       61.0 in Accession #:    [id number]  Weight:       126.5 lb Date of Birth:  February 01, 1934   BSA:          1.555 m Patient Age:    88 years    BP:           165/72 mmHg Patient Gender: F           HR:           82 bpm. Exam Location:  Inpatient Procedure: Transesophageal Echo, Cardiac Doppler, Color Doppler and 3D Echo Indications:     Endocarditis  History:         Patient has prior history of Echocardiogram examinations, most                  recent 01/25/2022. Pacemaker, Mitral Valve Disease; Risk                  Factors:Dyslipidemia and Diabetes.  Sonographer:     Darlina Sicilian RDCS Referring Phys:  [id number] [place] Diagnosing Phys: Eleonore Chiquito MD PROCEDURE: After discussion of  the risks and benefits of a TEE, an informed consent was obtained from the patient. TEE procedure time was 14 minutes. The transesophogeal probe was passed without difficulty through the esophogus of the patient. Imaged were obtained with the patient in a left lateral decubitus position. Local oropharyngeal anesthetic was provided with Benzocaine spray. Sedation performed by different physician. The patient was monitored while under deep sedation. Anesthestetic sedation  was provided intravenously by Anesthesiology: '86mg'$  of Propofol. Image quality was excellent. The patient's vital signs; including heart rate, blood pressure, and oxygen saturation; remained stable throughout the procedure. The patient developed no complications during the procedure.  IMPRESSIONS  1. Left ventricular ejection fraction, by estimation, is 50 to 55%. The left ventricle has low normal function.  2. Right ventricular systolic function is normal. The right ventricular size is normal. There is normal pulmonary artery systolic pressure.  3. Left atrial size was moderately dilated. No left atrial/left atrial appendage thrombus was detected.  4. The mitral valve is degenerative. Mild mitral valve regurgitation. No evidence of mitral stenosis.  5. The aortic valve is tricuspid. There is mild calcification of the aortic valve. There is mild thickening of the aortic valve. Aortic valve regurgitation is not visualized. Aortic valve sclerosis is present, with no evidence of aortic valve stenosis.  6. There is mild (Grade II) layered plaque involving the descending aorta and aortic arch. Conclusion(s)/Recommendation(s): No evidence of vegetation/infective endocarditis on this transesophageael echocardiogram. FINDINGS  Left Ventricle: Left ventricular ejection fraction, by estimation, is 50 to 55%. The left ventricle has low normal function. The left ventricular internal cavity size was normal in size. There is no left ventricular hypertrophy. Right  Ventricle: The right ventricular size is normal. No increase in right ventricular wall thickness. Right ventricular systolic function is normal. There is normal pulmonary artery systolic pressure. Left Atrium: Left atrial size was moderately dilated. No left atrial/left atrial appendage thrombus was detected. Right Atrium: Right atrial size was normal in size. Pericardium: There is no evidence of pericardial effusion. Mitral Valve: The mitral valve is degenerative in appearance. There is moderate calcification of the mitral valve leaflet(s). Mildly decreased mobility of the mitral valve leaflets. Mild to moderate  mitral annular calcification. Mild mitral valve regurgitation. No evidence of mitral valve stenosis. MV peak gradient, 8.2 mmHg. The mean mitral valve gradient is 3.0 mmHg with average heart rate of 70 bpm. There is no evidence of mitral valve vegetation. Tricuspid Valve: The tricuspid valve is grossly normal. Tricuspid valve regurgitation is mild . No evidence of tricuspid stenosis. There is no evidence of tricuspid valve vegetation. Aortic Valve: The aortic valve is tricuspid. There is mild calcification of the aortic valve. There is mild thickening of the aortic valve. There is mild aortic valve annular calcification. Aortic valve regurgitation is not visualized. Aortic valve sclerosis is present, with no evidence of aortic valve stenosis. There is no evidence of aortic valve vegetation. Pulmonic Valve: The pulmonic valve was grossly normal. Pulmonic valve regurgitation is not visualized. No evidence of pulmonic stenosis. There is no evidence of pulmonic valve vegetation. Aorta: The aortic root and ascending aorta are structurally normal, with no evidence of dilitation. There is mild (Grade II) layered plaque involving the descending aorta and aortic arch. Venous: The right lower pulmonary vein, right upper pulmonary vein, left upper pulmonary vein and left lower pulmonary vein are normal. IAS/Shunts: No  atrial level shunt detected by color flow Doppler. Additional Comments: A device lead is visualized in the right atrium, right ventricle and superior vena cava. Spectral Doppler performed. LEFT VENTRICLE PLAX 2D LVOT diam:     2.00 cm LVOT Area:     3.14 cm   AORTA Ao Root diam: 2.68 cm Ao Asc diam:  3.00 cm MITRAL VALVE            TRICUSPID VALVE MV Peak grad: 8.2 mmHg  TR Peak grad:   25.0 mmHg MV Mean grad: 3.0 mmHg  TR Vmax:        250.00 cm/s MV Vmax:      1.43 m/s MV Vmean:     87.0 cm/s SHUNTS                         Systemic Diam: 2.00 cm Eleonore Chiquito MD Electronically signed by Eleonore Chiquito MD Signature Date/Time: 01/29/2022/2:41:37 PM    Final    DG CHEST PORT 1 VIEW  Result Date: 01/29/2022 CLINICAL DATA:  Status post transesophageal ECHO and pacemaker removal EXAM: PORTABLE CHEST 1 VIEW COMPARISON:  Chest radiograph dated 01/25/2022 FINDINGS: Lines/tubes: Interval removal of left IJ central venous catheter and left chest wall pacemaker. Chest: Persistent bilateral interstitial and bibasilar hazy opacities and dense left retrocardiac opacity. Pleura: Similar small bilateral pleural effusions. New vertically oriented curvilinear interface traversing the lateral left lung is favored to represent a skin fold. No definite pneumothorax. Heart/mediastinum: Similar cardiomediastinal silhouette. w Aortic atherosclerosis. Bones: Old right lateral seventh rib fracture. IMPRESSION: 1. Interval removal of left IJ central venous catheter and left chest wall pacemaker. 2. Persistent left retrocardiac atelectasis and mild interstitial edema. 3. Similar small bilateral pleural effusions. 4. Vertically oriented curvilinear interface traversing the lateral left lung is favored to represent a skin fold. No definite pneumothorax. Attention on follow-up. Electronically Signed   By: Darrin Nipper M.D.   On: 01/29/2022 13:25   EP PPM/ICD IMPLANT  Result Date: 01/29/2022 Conclusion: Successful removal of a previously  implanted dual-chamber pacemaker in a patient with a history of sinus node dysfunction and who was found to have staph sepsis.  There was no evidence of infection on transesophageal echo or in the pocket but guideline directed therapy strongly suggest removal  of the device which was carried out without complication. Cristopher Peru, MD    Patient Profile     88 y.o. female history of coronary artery disease, type 2 diabetes, hyperlipidemia, atrial fibrillation, rheumatoid arthritis, pacemaker with MSSA bacteremia.    Assessment & Plan    MSSA Bacteremia CIED infection S/p device explant by Dr. Lovena Le 01/29/2022 No external sutures. Normal wound check.  ABx per primary.    3. Sinus pauses Plan for monitor on discharge.    4. Thrombocytopenia 5. DIC PLT  142* (11/28 0127) HGB  9.5* (11/28 0127)  6. Paroxysmal atrial fibrillation Transition amiodarone to 200 mg BID x 5 days, then 200 mg daily until follow up. Plan to eventually stop.  [id number]  is at least 6.  Pt and family decline [place] at this time with problems with falls and injury while on [facility] in the past.    For questions or updates, please contact [facility] Please consult [email or web address] for contact info under Cardiology/STEMI.  Signed, Shirley Friar, PA-C  01/30/2022, 8:36 AM   EP Attending  Patient seen and examined. Agree with above. The patient is stable after PM system removal of Staph sepsis. She had atrial fib and was placed on amio and is now back in NSR. She can be discharged home from my perspective with IV anti-biotics and a 14 day zio monitor. No plans for another PPM at this point unless she were to develop symptomatic brady. We will dc with low dose amio after a 5 day load.  Carleene Overlie Ana Liaw,MD

## 2022-01-30 NOTE — TOC Progression Note (Signed)
Transition of Care [facility]) - Progression Note    Patient Details  Name: Shelby Walker MRN: [id number] Date of Birth: 07-Nov-1933  Transition of Care [facility]) CM/SW [place], [place] Phone Number: 01/30/2022, 10:44 AM  Clinical Narrative:     Called patient's daughter, Lisa-left voice message to return call.     Barriers to Discharge: Continued Medical Work up  Expected Discharge Plan and Services     Discharge Planning Services: CM Consult   Living arrangements for the past 2 months: Single Family Home                                       Social Determinants of Health (SDOH) Interventions    Readmission Risk Interventions     No data to display

## 2022-01-30 NOTE — TOC Progression Note (Signed)
Transition of Care [facility]) - Progression Note    Patient Details  Name: Shelby Walker MRN: [id number] Date of Birth: 1933-08-01  Transition of Care [facility]) CM/SW [place], [place] Phone Number: 01/30/2022, 7:54 PM  Clinical Narrative:     CSW spoke with patient's daughter,Lisa. CSW introduced self and explained role. CSW informed of PT/OT recommendation of short term rehab at [facility]. She requested for patient to be considered for CIR. She states she would be able to provide the support needed after CIR. She expressed SNF can be a back up plan but more than likely will discharge home.  CSW answered all questions.   CSW updated PT/OT/CIR  Thurmond Butts, MSW, LCSW Clinical Social Worker        Barriers to Discharge: Continued Medical Work up  Expected Discharge Plan and Services     Discharge Planning Services: CM Consult   Living arrangements for the past 2 months: Single Family Home                                       Social Determinants of Health (SDOH) Interventions    Readmission Risk Interventions     No data to display

## 2022-01-31 ENCOUNTER — Inpatient Hospital Stay (HOSPITAL_COMMUNITY): Payer: Medicare Other

## 2022-01-31 ENCOUNTER — Other Ambulatory Visit (HOSPITAL_COMMUNITY): Payer: Self-pay

## 2022-01-31 DIAGNOSIS — R6521 Severe sepsis with septic shock: Secondary | ICD-10-CM | POA: Diagnosis not present

## 2022-01-31 DIAGNOSIS — A419 Sepsis, unspecified organism: Secondary | ICD-10-CM | POA: Diagnosis not present

## 2022-01-31 DIAGNOSIS — R7881 Bacteremia: Secondary | ICD-10-CM | POA: Diagnosis not present

## 2022-01-31 DIAGNOSIS — T827XXD Infection and inflammatory reaction due to other cardiac and vascular devices, implants and grafts, subsequent encounter: Secondary | ICD-10-CM | POA: Diagnosis not present

## 2022-01-31 DIAGNOSIS — Z8619 Personal history of other infectious and parasitic diseases: Secondary | ICD-10-CM | POA: Diagnosis not present

## 2022-01-31 LAB — BASIC METABOLIC PANEL
Anion gap: 12 (ref 5–15)
BUN: 25 mg/dL — ABNORMAL HIGH (ref 8–23)
CO2: 29 mmol/L (ref 22–32)
Calcium: 7.5 mg/dL — ABNORMAL LOW (ref 8.9–10.3)
Chloride: 101 mmol/L (ref 98–111)
Creatinine, Ser: 1.24 mg/dL — ABNORMAL HIGH (ref 0.44–1.00)
GFR, Estimated: 42 mL/min — ABNORMAL LOW (ref >60)
Glucose, Bld: 143 mg/dL — ABNORMAL HIGH (ref 70–99)
Potassium: 3.4 mmol/L — ABNORMAL LOW (ref 3.5–5.1)
Sodium: 142 mmol/L (ref 135–145)

## 2022-01-31 LAB — CBC
HCT: 29.8 % — ABNORMAL LOW (ref 36.0–46.0)
Hemoglobin: 9.7 g/dL — ABNORMAL LOW (ref 12.0–15.0)
MCH: 29.7 pg (ref 26.0–34.0)
MCHC: 32.6 g/dL (ref 30.0–36.0)
MCV: 91.1 fL (ref 80.0–100.0)
Platelets: 178 10*3/uL (ref 150–400)
RBC: 3.27 MIL/uL — ABNORMAL LOW (ref 3.87–5.11)
RDW: 15.4 % (ref 11.5–15.5)
WBC: 8.3 10*3/uL (ref 4.0–10.5)
nRBC: 0 % (ref 0.0–0.2)

## 2022-01-31 LAB — GLUCOSE, CAPILLARY
Glucose-Capillary: 145 mg/dL — ABNORMAL HIGH (ref 70–99)
Glucose-Capillary: 156 mg/dL — ABNORMAL HIGH (ref 70–99)
Glucose-Capillary: 158 mg/dL — ABNORMAL HIGH (ref 70–99)
Glucose-Capillary: 165 mg/dL — ABNORMAL HIGH (ref 70–99)
Glucose-Capillary: 181 mg/dL — ABNORMAL HIGH (ref 70–99)
Glucose-Capillary: 214 mg/dL — ABNORMAL HIGH (ref 70–99)

## 2022-01-31 MED ORDER — VALACYCLOVIR HCL 500 MG PO TABS
1000.0000 mg | ORAL_TABLET | Freq: Three times a day (TID) | ORAL | Status: DC
  Administered 2022-01-31: 1000 mg via ORAL
  Filled 2022-01-31: qty 2

## 2022-01-31 MED ORDER — FUROSEMIDE 20 MG PO TABS
20.0000 mg | ORAL_TABLET | Freq: Every day | ORAL | Status: DC
  Administered 2022-01-31 – 2022-02-02 (×3): 20 mg via ORAL
  Filled 2022-01-31 (×4): qty 1

## 2022-01-31 MED ORDER — POTASSIUM CHLORIDE CRYS ER 20 MEQ PO TBCR
40.0000 meq | EXTENDED_RELEASE_TABLET | Freq: Two times a day (BID) | ORAL | Status: AC
  Administered 2022-01-31 (×2): 40 meq via ORAL
  Filled 2022-01-31 (×2): qty 2

## 2022-01-31 MED ORDER — SENNOSIDES-DOCUSATE SODIUM 8.6-50 MG PO TABS
1.0000 | ORAL_TABLET | Freq: Two times a day (BID) | ORAL | Status: DC
  Administered 2022-01-31 (×2): 1 via ORAL
  Filled 2022-01-31 (×4): qty 1

## 2022-01-31 MED ORDER — VANCOMYCIN HCL 125 MG PO CAPS
125.0000 mg | ORAL_CAPSULE | Freq: Two times a day (BID) | ORAL | Status: DC
  Administered 2022-01-31 – 2022-02-02 (×4): 125 mg via ORAL
  Filled 2022-01-31 (×6): qty 1

## 2022-01-31 MED ORDER — VALACYCLOVIR HCL 500 MG PO TABS
1000.0000 mg | ORAL_TABLET | Freq: Every day | ORAL | Status: DC
  Administered 2022-02-02: 1000 mg via ORAL
  Filled 2022-01-31 (×2): qty 2

## 2022-01-31 NOTE — Consult Note (Signed)
   [facility] Inpatient Consult   01/31/2022  Shelby Walker 1933-08-15 [id number]  [place] [facility] [ACO] Patient: Medicare ACO REACH  Primary Care Provider:  Reynold Bowen, MD with [facility]   Patient screened for hospitalization with noted high risk score for unplanned readmission risk and for length of stay with less than 30 days readmission. Reviewed to assess for potential [place] Management service needs for post hospital transition for care coordination.  Review of patient's electronic medical record reveals patient is currently being considered for an inpatient rehabilitation program for acute hospital transition.  Plan:  Continue to follow progress and disposition to assess for post hospital community care coordination/management needs.  Will assess for ongoing community needs as appropriate.  Of note, [facility] Care Management/Population Health does not replace or interfere with any arrangements made by the Inpatient Transition of Care team.  For questions contact:   Natividad Brood, RN BSN [place]  [phone] business mobile phone Toll free office [phone]  *[place]  [phone] Fax number: [phone] [email or web address]

## 2022-01-31 NOTE — Progress Notes (Signed)
[facility]  Date of Admission:  01/24/2022     Lines:  11/03-11/27 pacer   Abx: 11/22-c cefazolin                                                       Assessment: Mssa bacteremia Severe sepsis with septic shock S/p permanent pacemaker placement Assymptomatic bacteriuria Hx recurrent cdiff; previously severe presentation Troponinemia Aki Elevated lft Elevated inr -- dic r/o'ed Dm2 ckd3   88 yo female with dm2 hx right tma, ckd3, sinus arrest s/p pacer 11/03 admitted for 1 day chill, n/v/diarrhea, chest-abd pain, found to have mssa septic shock, briefly requiring pressor now off on HD#1   11/22 bcx mssa 11/22 ucx <50k ecoli/pseudomonas. I do not think she has GU infection, and this is all mssa sepsis 11/23 stool cdiff testing negative toxin/antigen   11/22 EKG sinus rhythm without pacer activity 11/22 Ct chest abd pelv showed sign of pulm edema as well and bilateral perinephric stranding; no diverticulitis 11/23 tte no obvious new eccentric regurg or tell tale valve changes for endocarditis     Concern for pacer infection and EP had removed this 11/27 Periop 11/27 tee no valve vegetation    ------------- 11/29 assessment Afib with rvr and she had refused Downs - on amio; cardiology following. No immediate pacer requirement  Continues to complain of abd pain/tender on exam moderately and constipation. Prior cdiff; no sign of active infection; started on oral po vanc prophy 11/28. Also on probiotics  Persistent midline pain in back and reasonable to scan mri today  New zoster like rash left lower lumbar/sacral area. In airborne contact isolation 11/29    Plan: Continue cefazolin for another week from 11/28 and can transition to oral abx to finish 6 weeks pacer endocarditis treatment Continue prophy po vanc until 1 week off systemic abx Agree with primary team about zoster and airborne isolation until lesion crusted over --  defer if primary team wants to start valtrex treatment F/u Mri t and l spine Discussed with primary team  I spent more than 35 minute reviewing data/chart, and coordinating care and >50% direct face to face time providing counseling/discussing diagnostics/treatment plan with patient   Principal Problem:   Severe sepsis with septic shock ([place]) Active Problems:   AKI (acute kidney injury) ([place])   MSSA bacteremia   Presence of cardiac pacemaker   Acute midline low back pain without sciatica   Allergies  Allergen Reactions   Cholestyramine     Possible- Makes throat burn. Patient said she can't take it    Compazine [Prochlorperazine Edisylate] Swelling    REACTION: " tongue swell and unable to swallow"   Sulfonamide Derivatives Other (See Comments)    REACTION: " broke out with fine itching bumps"   Hydrocodone Other (See Comments)   Infliximab Other (See Comments)    Other reaction(s): rash   Leflunomide     Other reaction(s): diarrhea   Prochlorperazine Other (See Comments)    Other reaction(s): Unknown   Rosuvastatin     Other reaction(s): muscle aches   Sulfa Antibiotics Other (See Comments)    Other reaction(s): tongue swelling    Scheduled Meds:  amiodarone  200 mg Oral BID   Followed by   Derrill Memo ON 02/04/2022] amiodarone  200 mg Oral Daily   amLODipine  10 mg Oral Daily   Chlorhexidine Gluconate Cloth  6 each Topical Daily   insulin aspart  0-9 Units Subcutaneous Q4H   lactose free nutrition  237 mL Oral BID BM   lidocaine  1 patch Transdermal Q24H   multivitamin  1 tablet Oral QHS   mouth rinse  15 mL Mouth Rinse 4 times per day   pantoprazole  40 mg Oral Daily   predniSONE  5 mg Oral Q breakfast   saccharomyces boulardii  250 mg Oral BID   sodium chloride flush  3 mL Intravenous Q12H   valACYclovir  1,000 mg Oral TID   vancomycin  125 mg Oral BID   Continuous Infusions:  sodium chloride 20 mL/hr at 01/30/22 1643   sodium chloride 20 mL/hr at 01/30/22 1643     ceFAZolin (ANCEF) IV 2 g (01/31/22 0900)   PRN Meds:.acetaminophen, docusate sodium, hydrALAZINE, HYDROmorphone (DILAUDID) injection, ondansetron (ZOFRAN) IV, mouth rinse, polyethylene glycol   SUBJECTIVE: New zoster like rash noted last pm lower back No pain there No stool; don't feel well and poor appetite Mid back pain remains Afebrile     Review of Systems: ROS All other ROS was negative, except mentioned above     OBJECTIVE: Vitals:   01/30/22 2029 01/31/22 0008 01/31/22 0428 01/31/22 0829  BP: (!) 138/58 (!) 122/53 (!) 161/61 102/68  Pulse: 81 77 81 82  Resp: '16 16 16 11  '$ Temp: 98.6 F (37 C) 98.5 F (36.9 C) 98.6 F (37 C) 98.9 F (37.2 C)  TempSrc: Oral Oral Oral Oral  SpO2: 98% 96% 96%   Weight:      Height:       Body mass index is 23.91 kg/m.  Physical Exam General/constitutional: no distress, pleasant, conversant HEENT: Normocephalic, PER, Conj Clear, EOMI, Oropharynx clear Neck supple CV: rrr no mrg Lungs: clear to auscultation, normal respiratory effort Abd: Soft; tender diffusely Ext: no edema Skin: vessicular rash on slight erythematous base left sacral region -- no tenderness Neuro: nonfocal MSK: continued tenderness lumbar and lower thoracic midline area      Lab Results Lab Results  Component Value Date   WBC 8.3 01/31/2022   HGB 9.7 (L) 01/31/2022   HCT 29.8 (L) 01/31/2022   MCV 91.1 01/31/2022   PLT 178 01/31/2022    Lab Results  Component Value Date   CREATININE 1.24 (H) 01/31/2022   BUN 25 (H) 01/31/2022   NA 142 01/31/2022   K 3.4 (L) 01/31/2022   CL 101 01/31/2022   CO2 29 01/31/2022    Lab Results  Component Value Date   ALT 30 01/29/2022   AST 57 (H) 01/29/2022   ALKPHOS 77 01/29/2022   BILITOT 0.4 01/29/2022      Microbiology: Recent Results (from the past 240 hour(s))  Resp Panel by RT-PCR (Flu A&B, Covid) Anterior Nasal Swab     Status: None   Collection Time: 01/24/22  9:23 AM   Specimen:  Anterior Nasal Swab  Result Value Ref Range Status   SARS Coronavirus 2 by RT PCR NEGATIVE NEGATIVE Final    Comment: (NOTE) SARS-CoV-2 target nucleic acids are NOT DETECTED.  The SARS-CoV-2 RNA is generally detectable in upper respiratory specimens during the acute phase of infection. The lowest concentration of SARS-CoV-2 viral copies this assay can detect is 138 copies/mL. A negative result does not preclude SARS-Cov-2 infection and should not be used as the sole basis for treatment or  other patient management decisions. A negative result may occur with  improper specimen collection/handling, submission of specimen other than nasopharyngeal swab, presence of viral mutation(s) within the areas targeted by this assay, and inadequate number of viral copies(<138 copies/mL). A negative result must be combined with clinical observations, patient history, and epidemiological information. The expected result is Negative.  Fact Sheet for Patients:  EntrepreneurPulse.com.au  Fact Sheet for Healthcare Providers:  IncredibleEmployment.be  This test is no t yet approved or cleared by the [place] FDA and  has been authorized for detection and/or diagnosis of SARS-CoV-2 by FDA under an Emergency Use Authorization (EUA). This EUA will remain  in effect (meaning this test can be used) for the duration of the COVID-19 declaration under Section 564(b)(1) of the Act, 21 U.S.C.section 360bbb-3(b)(1), unless the authorization is terminated  or revoked sooner.       Influenza A by PCR NEGATIVE NEGATIVE Final   Influenza B by PCR NEGATIVE NEGATIVE Final    Comment: (NOTE) The Xpert Xpress SARS-CoV-2/FLU/RSV plus assay is intended as an aid in the diagnosis of influenza from Nasopharyngeal swab specimens and should not be used as a sole basis for treatment. Nasal washings and aspirates are unacceptable for Xpert Xpress SARS-CoV-2/FLU/RSV testing.  Fact  Sheet for Patients: EntrepreneurPulse.com.au  Fact Sheet for Healthcare Providers: IncredibleEmployment.be  This test is not yet approved or cleared by the [place] FDA and has been authorized for detection and/or diagnosis of SARS-CoV-2 by FDA under an Emergency Use Authorization (EUA). This EUA will remain in effect (meaning this test can be used) for the duration of the COVID-19 declaration under Section 564(b)(1) of the Act, 21 U.S.C. section 360bbb-3(b)(1), unless the authorization is terminated or revoked.  Performed at [facility], [place] [address], [place], [place] [address]   Blood Culture (routine x 2)     Status: Abnormal   Collection Time: 01/24/22  9:23 AM   Specimen: BLOOD  Result Value Ref Range Status   Specimen Description BLOOD SITE NOT SPECIFIED  Final   Special Requests   Final    BOTTLES DRAWN AEROBIC AND ANAEROBIC Blood Culture adequate volume   Culture  Setup Time   Final    GRAM POSITIVE COCCI IN PAIRS IN CLUSTERS IN BOTH AEROBIC AND ANAEROBIC BOTTLES CRITICAL RESULT CALLED TO, READ BACK BY AND VERIFIED WITH: T RUDISILL,PHARMD'@0004'$  01/25/22 [place] Performed at [facility], [place] [address] [place],  [address]    Culture STAPHYLOCOCCUS AUREUS (A)  Final   Report Status 01/26/2022 FINAL  Final   Organism ID, Bacteria STAPHYLOCOCCUS AUREUS  Final      Susceptibility   Staphylococcus aureus - MIC*    CIPROFLOXACIN <=0.5 SENSITIVE Sensitive     ERYTHROMYCIN <=0.25 SENSITIVE Sensitive     GENTAMICIN <=0.5 SENSITIVE Sensitive     OXACILLIN 0.5 SENSITIVE Sensitive     TETRACYCLINE <=1 SENSITIVE Sensitive     VANCOMYCIN <=0.5 SENSITIVE Sensitive     TRIMETH/SULFA <=10 SENSITIVE Sensitive     CLINDAMYCIN <=0.25 SENSITIVE Sensitive     RIFAMPIN <=0.5 SENSITIVE Sensitive     Inducible Clindamycin NEGATIVE Sensitive     * STAPHYLOCOCCUS AUREUS  Urine Culture     Status: Abnormal   Collection Time: 01/24/22   9:23 AM   Specimen: In/Out Cath Urine  Result Value Ref Range Status   Specimen Description IN/OUT CATH URINE  Final   Special Requests   Final    NONE Performed at [facility], [place] [address] [place], [place] [address]    Culture (A)  Final    40,000 COLONIES/mL ESCHERICHIA COLI 40,000 COLONIES/mL PSEUDOMONAS AERUGINOSA    Report Status 01/26/2022 FINAL  Final   Organism ID, Bacteria ESCHERICHIA COLI (A)  Final   Organism ID, Bacteria PSEUDOMONAS AERUGINOSA (A)  Final      Susceptibility   Escherichia coli - MIC*    AMPICILLIN 16 INTERMEDIATE Intermediate     CEFAZOLIN <=4 SENSITIVE Sensitive     CEFEPIME <=0.12 SENSITIVE Sensitive     CEFTRIAXONE <=0.25 SENSITIVE Sensitive     CIPROFLOXACIN <=0.25 SENSITIVE Sensitive     GENTAMICIN <=1 SENSITIVE Sensitive     IMIPENEM <=0.25 SENSITIVE Sensitive     NITROFURANTOIN 32 SENSITIVE Sensitive     TRIMETH/SULFA <=20 SENSITIVE Sensitive     AMPICILLIN/SULBACTAM 4 SENSITIVE Sensitive     PIP/TAZO <=4 SENSITIVE Sensitive     * 40,000 COLONIES/mL ESCHERICHIA COLI   Pseudomonas aeruginosa - MIC*    CEFTAZIDIME 2 SENSITIVE Sensitive     CIPROFLOXACIN 0.5 SENSITIVE Sensitive     GENTAMICIN <=1 SENSITIVE Sensitive     IMIPENEM 2 SENSITIVE Sensitive     PIP/TAZO 8 SENSITIVE Sensitive     CEFEPIME 4 SENSITIVE Sensitive     * 40,000 COLONIES/mL PSEUDOMONAS AERUGINOSA  Blood Culture ID Panel (Reflexed)     Status: Abnormal   Collection Time: 01/24/22  9:23 AM  Result Value Ref Range Status   Enterococcus faecalis NOT DETECTED NOT DETECTED Final   Enterococcus Faecium NOT DETECTED NOT DETECTED Final   Listeria monocytogenes NOT DETECTED NOT DETECTED Final   Staphylococcus species DETECTED (A) NOT DETECTED Final    Comment: CRITICAL RESULT CALLED TO, READ BACK BY AND VERIFIED WITH: T RUDISILL,PHARMD'@0004'$  01/25/22 [place]    Staphylococcus aureus (BCID) DETECTED (A) NOT DETECTED Final    Comment: CRITICAL RESULT CALLED TO, READ BACK BY  AND VERIFIED WITH: T RUDISILL,PHARMD'@0004'$  01/25/22 [place]    Staphylococcus epidermidis NOT DETECTED NOT DETECTED Final   Staphylococcus lugdunensis NOT DETECTED NOT DETECTED Final   Streptococcus species NOT DETECTED NOT DETECTED Final   Streptococcus agalactiae NOT DETECTED NOT DETECTED Final   Streptococcus pneumoniae NOT DETECTED NOT DETECTED Final   Streptococcus pyogenes NOT DETECTED NOT DETECTED Final   A.calcoaceticus-baumannii NOT DETECTED NOT DETECTED Final   Bacteroides fragilis NOT DETECTED NOT DETECTED Final   Enterobacterales NOT DETECTED NOT DETECTED Final   Enterobacter cloacae complex NOT DETECTED NOT DETECTED Final   Escherichia coli NOT DETECTED NOT DETECTED Final   Klebsiella aerogenes NOT DETECTED NOT DETECTED Final   Klebsiella oxytoca NOT DETECTED NOT DETECTED Final   Klebsiella pneumoniae NOT DETECTED NOT DETECTED Final   Proteus species NOT DETECTED NOT DETECTED Final   Salmonella species NOT DETECTED NOT DETECTED Final   Serratia marcescens NOT DETECTED NOT DETECTED Final   Haemophilus influenzae NOT DETECTED NOT DETECTED Final   Neisseria meningitidis NOT DETECTED NOT DETECTED Final   Pseudomonas aeruginosa NOT DETECTED NOT DETECTED Final   Stenotrophomonas maltophilia NOT DETECTED NOT DETECTED Final   Candida albicans NOT DETECTED NOT DETECTED Final   Candida auris NOT DETECTED NOT DETECTED Final   Candida glabrata NOT DETECTED NOT DETECTED Final   Candida krusei NOT DETECTED NOT DETECTED Final   Candida parapsilosis NOT DETECTED NOT DETECTED Final   Candida tropicalis NOT DETECTED NOT DETECTED Final   Cryptococcus neoformans/gattii NOT DETECTED NOT DETECTED Final   Meth resistant mecA/C and MREJ NOT DETECTED NOT DETECTED Final  Comment: Performed at [facility], [place] [address], [place], [place] [address]  Blood Culture (routine x 2)     Status: Abnormal   Collection Time: 01/24/22  9:28 AM   Specimen: BLOOD  Result Value Ref Range Status    Specimen Description BLOOD SITE NOT SPECIFIED  Final   Special Requests   Final    BOTTLES DRAWN AEROBIC AND ANAEROBIC Blood Culture results may not be optimal due to an excessive volume of blood received in culture bottles   Culture  Setup Time   Final    GRAM POSITIVE COCCI IN PAIRS IN BOTH AEROBIC AND ANAEROBIC BOTTLES CRITICAL VALUE NOTED.  VALUE IS CONSISTENT WITH PREVIOUSLY REPORTED AND CALLED VALUE.    Culture (A)  Final    STAPHYLOCOCCUS AUREUS SUSCEPTIBILITIES PERFORMED ON PREVIOUS CULTURE WITHIN THE LAST 5 DAYS. Performed at [facility], [place] [address] [place], [place] [address]    Report Status 01/26/2022 FINAL  Final  Gastrointestinal Panel by PCR , Stool     Status: None   Collection Time: 01/24/22 10:36 AM   Specimen: Stool  Result Value Ref Range Status   Campylobacter species NOT DETECTED NOT DETECTED Final   Plesimonas shigelloides NOT DETECTED NOT DETECTED Final   Salmonella species NOT DETECTED NOT DETECTED Final   Yersinia enterocolitica NOT DETECTED NOT DETECTED Final   Vibrio species NOT DETECTED NOT DETECTED Final   Vibrio cholerae NOT DETECTED NOT DETECTED Final   Enteroaggregative E coli (EAEC) NOT DETECTED NOT DETECTED Final   Enteropathogenic E coli (EPEC) NOT DETECTED NOT DETECTED Final   Enterotoxigenic E coli (ETEC) NOT DETECTED NOT DETECTED Final   Shiga like toxin producing E coli (STEC) NOT DETECTED NOT DETECTED Final   Shigella/Enteroinvasive E coli (EIEC) NOT DETECTED NOT DETECTED Final   Cryptosporidium NOT DETECTED NOT DETECTED Final   Cyclospora cayetanensis NOT DETECTED NOT DETECTED Final   Entamoeba histolytica NOT DETECTED NOT DETECTED Final   Giardia lamblia NOT DETECTED NOT DETECTED Final   Adenovirus F40/41 NOT DETECTED NOT DETECTED Final   Astrovirus NOT DETECTED NOT DETECTED Final   Norovirus GI/GII NOT DETECTED NOT DETECTED Final   Rotavirus A NOT DETECTED NOT DETECTED Final   Sapovirus (I, II, IV, and V) NOT DETECTED NOT  DETECTED Final    Comment: Performed at [facility], [place] [place], [place] [address]  MRSA Next Gen by PCR, Nasal     Status: None   Collection Time: 01/24/22 11:00 PM   Specimen: Nasal Mucosa; Nasal Swab  Result Value Ref Range Status   MRSA by PCR Next Gen NOT DETECTED NOT DETECTED Final    Comment: (NOTE) The GeneXpert MRSA Assay (FDA approved for NASAL specimens only), is one component of a comprehensive MRSA colonization surveillance program. It is not intended to diagnose MRSA infection nor to guide or monitor treatment for MRSA infections. Test performance is not FDA approved in patients less than 8 years old. Performed at [facility], [place] [address], [place], [place] [address]   C Difficile Quick Screen w PCR reflex     Status: None   Collection Time: 01/25/22  5:14 AM   Specimen: STOOL  Result Value Ref Range Status   C Diff antigen NEGATIVE NEGATIVE Final   C Diff toxin NEGATIVE NEGATIVE Final   C Diff interpretation No C. difficile detected.  Final    Comment: Performed at [facility], [place] [address], [place], [place] [address]  Culture, blood (Routine X 2) w Reflex to ID Panel     Status: None   Collection Time: 01/25/22  2:56 PM   Specimen: BLOOD  Result Value Ref Range Status   Specimen Description BLOOD SITE NOT SPECIFIED  Final   Special Requests   Final    BOTTLES DRAWN AEROBIC AND ANAEROBIC Blood Culture adequate volume   Culture   Final    NO GROWTH 5 DAYS Performed at [facility], [address] [place], [place] [address]    Report Status 01/30/2022 FINAL  Final  Culture, blood (Routine X 2) w Reflex to ID Panel     Status: None   Collection Time: 01/25/22  2:56 PM   Specimen: BLOOD  Result Value Ref Range Status   Specimen Description BLOOD SITE NOT SPECIFIED  Final   Special Requests   Final    BOTTLES DRAWN AEROBIC AND ANAEROBIC Blood Culture adequate volume   Culture   Final    NO GROWTH 5 DAYS Performed  at [facility], [place] [address] [place], [place] [address]    Report Status 01/30/2022 FINAL  Final     Serology:   Imaging: If present, new imagings (plain films, ct scans, and mri) have been personally visualized and interpreted; radiology reports have been reviewed. Decision making incorporated into the Impression / Recommendations.  11/27 tee 1. Left ventricular ejection fraction, by estimation, is 50 to 55%. The  left ventricle has low normal function.   2. Right ventricular systolic function is normal. The right ventricular  size is normal. There is normal pulmonary artery systolic pressure.   3. Left atrial size was moderately dilated. No left atrial/left atrial  appendage thrombus was detected.   4. The mitral valve is degenerative. Mild mitral valve regurgitation. No  evidence of mitral stenosis.   5. The aortic valve is tricuspid. There is mild calcification of the  aortic valve. There is mild thickening of the aortic valve. Aortic valve  regurgitation is not visualized. Aortic valve sclerosis is present, with  no evidence of aortic valve stenosis.   6. There is mild (Grade II) layered plaque involving the descending aorta  and aortic arch.   Conclusion(s)/Recommendation(s): No evidence of vegetation/infective  endocarditis on this transesophageael echocardiogram.   01/24/22 NM lung scan No pe  Jabier Mutton, [place] [facility] [phone] pager    01/31/2022, 12:27 PM

## 2022-01-31 NOTE — Progress Notes (Signed)
PROGRESS NOTE    Shelby Walker  ATF:[id number] DOB: Sep 23, 1933 DOA: 01/24/2022 PCP: Reynold Bowen, MD  88/F with history of recurrent C. difficile colitis, chronic diastolic CHF, mitral regurgitation, CAD, paroxysmal A-fib, diabetes mellitus, psoriasis, rheumatoid arthritis, history of PAD/gangrene and transmetatarsal amputation 11/22, recently hospitalized with prolonged sinus pauses, underwent pacemaker implantation on 01/05/2022 and loop recorder removal -Admitted to the ICU 11/22 with septic shock, found to have MSSA bacteremia -Treated with fluid resuscitation, pressors, antibiotics, infectious disease consulted -Transferred from PCCM to [facility] service 11/24 11/24: EP consulted,  11/27: PPM removed 11/29, new rash, shingles, starting valacyclovir  Subjective: -Some back pain, breathing better, oral intake is fair  Assessment and Plan:  SEPTIC SHOCK MSSA  bacteremia - poa -on CT abd perinephric stranding likely red herring.  Recent PPM on 11/3 for long sinus pauses, pacemaker site appears unremarkable -continue cefazolin, repeat blood cultures 11/23 negative so far, 2D echo without indication of endocarditis -Stable off pressors -ID following, considering MRI LS spine tomorrow -EP consult appreciated, s/p PPM explant 11/27 -PICC line requested, appreciate ID input, recommended MRI thoracic and lumbar spine for back pain, remains on IV Ancef duration per ID -Ambulate, PT OT eval, CIR being considered -Voiding trial, DC Foley today  Transient pleuritic chest pain 11/26 -Resolved -She had a VQ scan few days ago with was unremarkable, monitor clinically   AKI on CKD2 -baseline creat around 1.2, peaked at 2.6 -in the setting of septic shock, now improving,  Hypokalemia -Replaced  Elevated troponin History of CAD and carotid artery disease -Medical management was recommended as outpatient in the setting of advanced age -Echo with preserved EF, no wall motion  abnormalities -Coronary CTA 10/23 noted moderate (50-69%) mixed plaque in the left main and LAD.Can not rule out severe (>70%) splenosis in OM1 -avoid BB w/ recent long sinus pauses and allergic to statin -PCCM discussed with cards 11/23, felt to be demand related in the setting of septic shock, echo with preserved EF, no WMA, no role for Anticoagulation/LHC with DIC, AKI, supportive care recommended, unclear why troponins were checked in the ICU  Thrombocytopenia DIC -In the setting of septic shock, bacteremia -Antibiotics as noted above -Improving  Urinary retention -Noted in ICU, will attempt to DC catheter today, voiding trial  Paroxysmal atrial fibrillation -  -Now on amiodarone gtt., transitioning to p.o. 200 Mg twice daily X 5 days followed by 200 Mg daily until follow-up, family would like to hold off on anticoagulation at this time with frequent falls  Chronic diastolic CHF -Oral Lasix today echo with preserved EF   Rheumatoid arthritis steroid-dependent - Dr Amil Amen  -she was started on stress dose steroids in the ICU, resumed prednisone, on 4 Mg daily at baseline  H/o recurrent Cdiff -now on Abx as above -Denies diarrhea at this time, continue floraster    Chronic anemia - hgb 8-9gm% -At baseline, monitor    Chronic pain from RA-  -At baseline on gabapentin, tramadol, Tylenol 3  -Continue tramadol, gabapentin at lower dose   Diabetes mellitus with complications -Sliding scale insulin protocol   DVT prophylaxis: SCDs,  Code Status: DNR Family Communication: Discussed with daughter at bedside Disposition Plan: Possible CIR  Consultants: ID, EP   Procedures: 11/27, pacemaker removed  Antimicrobials:    Objective: Vitals:   01/30/22 2029 01/31/22 0008 01/31/22 0428 01/31/22 0829  BP: (!) 138/58 (!) 122/53 (!) 161/61 102/68  Pulse: 81 77 81 82  Resp: '16 16 16 11  '$ Temp: 98.6 F (  37 C) 98.5 F (36.9 C) 98.6 F (37 C) 98.9 F (37.2 C)  TempSrc: Oral Oral  Oral Oral  SpO2: 98% 96% 96%   Weight:      Height:        Intake/Output Summary (Last 24 hours) at 01/31/2022 1238 Last data filed at 01/31/2022 0900 Gross per 24 hour  Intake 523.36 ml  Output 700 ml  Net -176.64 ml   Filed Weights   01/28/22 0339 01/29/22 0444 01/29/22 0910  Weight: 57.2 kg 57.4 kg 57.4 kg    Examination:  General exam: Elderly chronically ill female sitting up in bed, AAOx3, no distress HEENT: No JVD CVS: S1-S2, irregular Lungs: Decreased breath sounds at the bases Abdomen: Soft, nontender, bowel sounds present Extremities: Trace edema  Skin: Vesicles on left gluteal cheek Psychiatry: Flat affect    Data Reviewed:   CBC: Recent Labs  Lab 01/27/22 0507 01/28/22 0117 01/29/22 0136 01/30/22 0127 01/31/22 1057  WBC 14.5* 10.3 11.2* 8.3 8.3  HGB 7.8* 7.9* 9.4* 9.5* 9.7*  HCT 23.7* 23.5* 28.6* 29.9* 29.8*  MCV 90.5 89.0 89.9 92.3 91.1  PLT 91* 93* 133* 142* 161   Basic Metabolic Panel: Recent Labs  Lab 01/25/22 0514 01/25/22 1021 01/25/22 1449 01/26/22 0522 01/27/22 0507 01/28/22 0117 01/29/22 0136 01/30/22 0127 01/31/22 1057  NA 137 135   < > 142 135 144 143 143 142  K 4.8 4.8   < > 3.7 3.2* 2.5* 3.5 4.2 3.4*  CL 105 103   < > 103 104 106 105 102 101  CO2 20* 18*   < > 21* 21* '26 28 26 29  '$ GLUCOSE 188* 183*   < > 151* 124* 210* 192* 174* 143*  BUN 51* 50*   < > 54* 52* 45* 44* 36* 25*  CREATININE 2.67* 2.53*   < > 2.26* 1.87* 1.61* 1.47* 1.35* 1.24*  CALCIUM 7.3* 7.2*   < > 8.1* 7.2* 7.5* 7.5* 7.5* 7.5*  MG 1.7 1.6*  --  2.9*  --   --   --   --   --   PHOS  --  5.7*  --  5.4*  --   --   --   --   --    < > = values in this interval not displayed.   GFR: Estimated Creatinine Clearance: 26.5 mL/min (A) (by C-G formula based on SCr of 1.24 mg/dL (H)). Liver Function Tests: Recent Labs  Lab 01/25/22 1021 01/27/22 0507 01/28/22 0117 01/29/22 0136  AST 250* 212* 113* 57*  ALT 155* 116* 68* 30  ALKPHOS 61 79 68 77  BILITOT  0.5 0.7 0.8 0.4  PROT 4.6* 5.3* 4.9* 5.3*  ALBUMIN 2.4* 2.4* 2.3* 2.5*   Recent Labs  Lab 01/25/22 1021  LIPASE 23   No results for input(s): "AMMONIA" in the last 168 hours. Coagulation Profile: Recent Labs  Lab 01/25/22 0936  INR 2.0*   Cardiac Enzymes: No results for input(s): "CKTOTAL", "CKMB", "CKMBINDEX", "TROPONINI" in the last 168 hours. BNP (last 3 results) No results for input(s): "PROBNP" in the last 8760 hours. HbA1C: No results for input(s): "HGBA1C" in the last 72 hours.  CBG: Recent Labs  Lab 01/30/22 1626 01/30/22 2031 01/31/22 0010 01/31/22 0426 01/31/22 0834  GLUCAP 217* 145* 181* 145* 165*   Lipid Profile: No results for input(s): "CHOL", "HDL", "LDLCALC", "TRIG", "CHOLHDL", "LDLDIRECT" in the last 72 hours. Thyroid Function Tests: No results for input(s): "TSH", "T4TOTAL", "FREET4", "T3FREE", "THYROIDAB" in the  last 72 hours. Anemia Panel: No results for input(s): "VITAMINB12", "FOLATE", "FERRITIN", "TIBC", "IRON", "RETICCTPCT" in the last 72 hours. Urine analysis:    Component Value Date/Time   COLORURINE YELLOW 01/24/2022 0954   APPEARANCEUR HAZY (A) 01/24/2022 0954   LABSPEC 1.018 01/24/2022 0954   PHURINE 5.0 01/24/2022 0954   GLUCOSEU NEGATIVE 01/24/2022 0954   HGBUR NEGATIVE 01/24/2022 0954   BILIRUBINUR NEGATIVE 01/24/2022 0954   KETONESUR NEGATIVE 01/24/2022 0954   PROTEINUR 30 (A) 01/24/2022 0954   NITRITE NEGATIVE 01/24/2022 0954   LEUKOCYTESUR MODERATE (A) 01/24/2022 0954   Sepsis Labs: '@LABRCNTIP'$ (procalcitonin:4,lacticidven:4)  ) Recent Results (from the past 240 hour(s))  Resp Panel by RT-PCR (Flu A&B, Covid) Anterior Nasal Swab     Status: None   Collection Time: 01/24/22  9:23 AM   Specimen: Anterior Nasal Swab  Result Value Ref Range Status   SARS Coronavirus 2 by RT PCR NEGATIVE NEGATIVE Final    Comment: (NOTE) SARS-CoV-2 target nucleic acids are NOT DETECTED.  The SARS-CoV-2 RNA is generally detectable in upper  respiratory specimens during the acute phase of infection. The lowest concentration of SARS-CoV-2 viral copies this assay can detect is 138 copies/mL. A negative result does not preclude SARS-Cov-2 infection and should not be used as the sole basis for treatment or other patient management decisions. A negative result may occur with  improper specimen collection/handling, submission of specimen other than nasopharyngeal swab, presence of viral mutation(s) within the areas targeted by this assay, and inadequate number of viral copies(<138 copies/mL). A negative result must be combined with clinical observations, patient history, and epidemiological information. The expected result is Negative.  Fact Sheet for Patients:  EntrepreneurPulse.com.au  Fact Sheet for Healthcare Providers:  IncredibleEmployment.be  This test is no t yet approved or cleared by the [place] FDA and  has been authorized for detection and/or diagnosis of SARS-CoV-2 by FDA under an Emergency Use Authorization (EUA). This EUA will remain  in effect (meaning this test can be used) for the duration of the COVID-19 declaration under Section 564(b)(1) of the Act, 21 U.S.C.section 360bbb-3(b)(1), unless the authorization is terminated  or revoked sooner.       Influenza A by PCR NEGATIVE NEGATIVE Final   Influenza B by PCR NEGATIVE NEGATIVE Final    Comment: (NOTE) The Xpert Xpress SARS-CoV-2/FLU/RSV plus assay is intended as an aid in the diagnosis of influenza from Nasopharyngeal swab specimens and should not be used as a sole basis for treatment. Nasal washings and aspirates are unacceptable for Xpert Xpress SARS-CoV-2/FLU/RSV testing.  Fact Sheet for Patients: EntrepreneurPulse.com.au  Fact Sheet for Healthcare Providers: IncredibleEmployment.be  This test is not yet approved or cleared by the [place] FDA and has been  authorized for detection and/or diagnosis of SARS-CoV-2 by FDA under an Emergency Use Authorization (EUA). This EUA will remain in effect (meaning this test can be used) for the duration of the COVID-19 declaration under Section 564(b)(1) of the Act, 21 U.S.C. section 360bbb-3(b)(1), unless the authorization is terminated or revoked.  Performed at [facility], [place] [address] [place], [place] [address]   Blood Culture (routine x 2)     Status: Abnormal   Collection Time: 01/24/22  9:23 AM   Specimen: BLOOD  Result Value Ref Range Status   Specimen Description BLOOD SITE NOT SPECIFIED  Final   Special Requests   Final    BOTTLES DRAWN AEROBIC AND ANAEROBIC Blood Culture adequate volume   Culture  Setup Time  Final    GRAM POSITIVE COCCI IN PAIRS IN CLUSTERS IN BOTH AEROBIC AND ANAEROBIC BOTTLES CRITICAL RESULT CALLED TO, READ BACK BY AND VERIFIED WITH: T RUDISILL,PHARMD'@0004'$  01/25/22 [place] Performed at [facility], [place] [address], [place], [place] [address]    Culture STAPHYLOCOCCUS AUREUS (A)  Final   Report Status 01/26/2022 FINAL  Final   Organism ID, Bacteria STAPHYLOCOCCUS AUREUS  Final      Susceptibility   Staphylococcus aureus - MIC*    CIPROFLOXACIN <=0.5 SENSITIVE Sensitive     ERYTHROMYCIN <=0.25 SENSITIVE Sensitive     GENTAMICIN <=0.5 SENSITIVE Sensitive     OXACILLIN 0.5 SENSITIVE Sensitive     TETRACYCLINE <=1 SENSITIVE Sensitive     VANCOMYCIN <=0.5 SENSITIVE Sensitive     TRIMETH/SULFA <=10 SENSITIVE Sensitive     CLINDAMYCIN <=0.25 SENSITIVE Sensitive     RIFAMPIN <=0.5 SENSITIVE Sensitive     Inducible Clindamycin NEGATIVE Sensitive     * STAPHYLOCOCCUS AUREUS  Urine Culture     Status: Abnormal   Collection Time: 01/24/22  9:23 AM   Specimen: In/Out Cath Urine  Result Value Ref Range Status   Specimen Description IN/OUT CATH URINE  Final   Special Requests   Final    NONE Performed at [facility], [place] [address], [place], [place]  [address]    Culture (A)  Final    40,000 COLONIES/mL ESCHERICHIA COLI 40,000 COLONIES/mL PSEUDOMONAS AERUGINOSA    Report Status 01/26/2022 FINAL  Final   Organism ID, Bacteria ESCHERICHIA COLI (A)  Final   Organism ID, Bacteria PSEUDOMONAS AERUGINOSA (A)  Final      Susceptibility   Escherichia coli - MIC*    AMPICILLIN 16 INTERMEDIATE Intermediate     CEFAZOLIN <=4 SENSITIVE Sensitive     CEFEPIME <=0.12 SENSITIVE Sensitive     CEFTRIAXONE <=0.25 SENSITIVE Sensitive     CIPROFLOXACIN <=0.25 SENSITIVE Sensitive     GENTAMICIN <=1 SENSITIVE Sensitive     IMIPENEM <=0.25 SENSITIVE Sensitive     NITROFURANTOIN 32 SENSITIVE Sensitive     TRIMETH/SULFA <=20 SENSITIVE Sensitive     AMPICILLIN/SULBACTAM 4 SENSITIVE Sensitive     PIP/TAZO <=4 SENSITIVE Sensitive     * 40,000 COLONIES/mL ESCHERICHIA COLI   Pseudomonas aeruginosa - MIC*    CEFTAZIDIME 2 SENSITIVE Sensitive     CIPROFLOXACIN 0.5 SENSITIVE Sensitive     GENTAMICIN <=1 SENSITIVE Sensitive     IMIPENEM 2 SENSITIVE Sensitive     PIP/TAZO 8 SENSITIVE Sensitive     CEFEPIME 4 SENSITIVE Sensitive     * 40,000 COLONIES/mL PSEUDOMONAS AERUGINOSA  Blood Culture ID Panel (Reflexed)     Status: Abnormal   Collection Time: 01/24/22  9:23 AM  Result Value Ref Range Status   Enterococcus faecalis NOT DETECTED NOT DETECTED Final   Enterococcus Faecium NOT DETECTED NOT DETECTED Final   Listeria monocytogenes NOT DETECTED NOT DETECTED Final   Staphylococcus species DETECTED (A) NOT DETECTED Final    Comment: CRITICAL RESULT CALLED TO, READ BACK BY AND VERIFIED WITH: T RUDISILL,PHARMD'@0004'$  01/25/22 [place]    Staphylococcus aureus (BCID) DETECTED (A) NOT DETECTED Final    Comment: CRITICAL RESULT CALLED TO, READ BACK BY AND VERIFIED WITH: T RUDISILL,PHARMD'@0004'$  01/25/22 [place]    Staphylococcus epidermidis NOT DETECTED NOT DETECTED Final   Staphylococcus lugdunensis NOT DETECTED NOT DETECTED Final   Streptococcus species NOT DETECTED NOT  DETECTED Final   Streptococcus agalactiae NOT DETECTED NOT DETECTED Final   Streptococcus pneumoniae NOT  DETECTED NOT DETECTED Final   Streptococcus pyogenes NOT DETECTED NOT DETECTED Final   A.calcoaceticus-baumannii NOT DETECTED NOT DETECTED Final   Bacteroides fragilis NOT DETECTED NOT DETECTED Final   Enterobacterales NOT DETECTED NOT DETECTED Final   Enterobacter cloacae complex NOT DETECTED NOT DETECTED Final   Escherichia coli NOT DETECTED NOT DETECTED Final   Klebsiella aerogenes NOT DETECTED NOT DETECTED Final   Klebsiella oxytoca NOT DETECTED NOT DETECTED Final   Klebsiella pneumoniae NOT DETECTED NOT DETECTED Final   Proteus species NOT DETECTED NOT DETECTED Final   Salmonella species NOT DETECTED NOT DETECTED Final   Serratia marcescens NOT DETECTED NOT DETECTED Final   Haemophilus influenzae NOT DETECTED NOT DETECTED Final   Neisseria meningitidis NOT DETECTED NOT DETECTED Final   Pseudomonas aeruginosa NOT DETECTED NOT DETECTED Final   Stenotrophomonas maltophilia NOT DETECTED NOT DETECTED Final   Candida albicans NOT DETECTED NOT DETECTED Final   Candida auris NOT DETECTED NOT DETECTED Final   Candida glabrata NOT DETECTED NOT DETECTED Final   Candida krusei NOT DETECTED NOT DETECTED Final   Candida parapsilosis NOT DETECTED NOT DETECTED Final   Candida tropicalis NOT DETECTED NOT DETECTED Final   Cryptococcus neoformans/gattii NOT DETECTED NOT DETECTED Final   Meth resistant mecA/C and MREJ NOT DETECTED NOT DETECTED Final    Comment: Performed at [facility], [address], [place], [place] [address]  Blood Culture (routine x 2)     Status: Abnormal   Collection Time: 01/24/22  9:28 AM   Specimen: BLOOD  Result Value Ref Range Status   Specimen Description BLOOD SITE NOT SPECIFIED  Final   Special Requests   Final    BOTTLES DRAWN AEROBIC AND ANAEROBIC Blood Culture results may not be optimal due to an excessive volume of blood received in culture bottles    Culture  Setup Time   Final    GRAM POSITIVE COCCI IN PAIRS IN BOTH AEROBIC AND ANAEROBIC BOTTLES CRITICAL VALUE NOTED.  VALUE IS CONSISTENT WITH PREVIOUSLY REPORTED AND CALLED VALUE.    Culture (A)  Final    STAPHYLOCOCCUS AUREUS SUSCEPTIBILITIES PERFORMED ON PREVIOUS CULTURE WITHIN THE LAST 5 DAYS. Performed at [facility], [place] [address] [place], [place] [address]    Report Status 01/26/2022 FINAL  Final  Gastrointestinal Panel by PCR , Stool     Status: None   Collection Time: 01/24/22 10:36 AM   Specimen: Stool  Result Value Ref Range Status   Campylobacter species NOT DETECTED NOT DETECTED Final   Plesimonas shigelloides NOT DETECTED NOT DETECTED Final   Salmonella species NOT DETECTED NOT DETECTED Final   Yersinia enterocolitica NOT DETECTED NOT DETECTED Final   Vibrio species NOT DETECTED NOT DETECTED Final   Vibrio cholerae NOT DETECTED NOT DETECTED Final   Enteroaggregative E coli (EAEC) NOT DETECTED NOT DETECTED Final   Enteropathogenic E coli (EPEC) NOT DETECTED NOT DETECTED Final   Enterotoxigenic E coli (ETEC) NOT DETECTED NOT DETECTED Final   Shiga like toxin producing E coli (STEC) NOT DETECTED NOT DETECTED Final   Shigella/Enteroinvasive E coli (EIEC) NOT DETECTED NOT DETECTED Final   Cryptosporidium NOT DETECTED NOT DETECTED Final   Cyclospora cayetanensis NOT DETECTED NOT DETECTED Final   Entamoeba histolytica NOT DETECTED NOT DETECTED Final   Giardia lamblia NOT DETECTED NOT DETECTED Final   Adenovirus F40/41 NOT DETECTED NOT DETECTED Final   Astrovirus NOT DETECTED NOT DETECTED Final   Norovirus GI/GII NOT DETECTED NOT DETECTED Final   Rotavirus A  NOT DETECTED NOT DETECTED Final   Sapovirus (I, II, IV, and V) NOT DETECTED NOT DETECTED Final    Comment: Performed at [facility], [place] [place], [place] [address]  MRSA Next Gen by PCR, Nasal     Status: None   Collection Time: 01/24/22 11:00 PM   Specimen: Nasal Mucosa; Nasal Swab   Result Value Ref Range Status   MRSA by PCR Next Gen NOT DETECTED NOT DETECTED Final    Comment: (NOTE) The GeneXpert MRSA Assay (FDA approved for NASAL specimens only), is one component of a comprehensive MRSA colonization surveillance program. It is not intended to diagnose MRSA infection nor to guide or monitor treatment for MRSA infections. Test performance is not FDA approved in patients less than 57 years old. Performed at [facility], [place] [address] [place], [place] [address]   C Difficile Quick Screen w PCR reflex     Status: None   Collection Time: 01/25/22  5:14 AM   Specimen: STOOL  Result Value Ref Range Status   C Diff antigen NEGATIVE NEGATIVE Final   C Diff toxin NEGATIVE NEGATIVE Final   C Diff interpretation No C. difficile detected.  Final    Comment: Performed at [facility], [place] [address] [place], [place] [address]  Culture, blood (Routine X 2) w Reflex to ID Panel     Status: None   Collection Time: 01/25/22  2:56 PM   Specimen: BLOOD  Result Value Ref Range Status   Specimen Description BLOOD SITE NOT SPECIFIED  Final   Special Requests   Final    BOTTLES DRAWN AEROBIC AND ANAEROBIC Blood Culture adequate volume   Culture   Final    NO GROWTH 5 DAYS Performed at [facility], [address] [place], [place] [address]    Report Status 01/30/2022 FINAL  Final  Culture, blood (Routine X 2) w Reflex to ID Panel     Status: None   Collection Time: 01/25/22  2:56 PM   Specimen: BLOOD  Result Value Ref Range Status   Specimen Description BLOOD SITE NOT SPECIFIED  Final   Special Requests   Final    BOTTLES DRAWN AEROBIC AND ANAEROBIC Blood Culture adequate volume   Culture   Final    NO GROWTH 5 DAYS Performed at [facility], [place] [address] [place], [place] [address]    Report Status 01/30/2022 FINAL  Final     Radiology Studies: [place] EKG SITE RITE  Result Date: 01/30/2022 If Site Rite image not attached, placement could  not be confirmed due to current cardiac rhythm.  DG CHEST PORT 1 VIEW  Result Date: 01/29/2022 CLINICAL DATA:  Status post transesophageal ECHO and pacemaker removal EXAM: PORTABLE CHEST 1 VIEW COMPARISON:  Chest radiograph dated 01/25/2022 FINDINGS: Lines/tubes: Interval removal of left IJ central venous catheter and left chest wall pacemaker. Chest: Persistent bilateral interstitial and bibasilar hazy opacities and dense left retrocardiac opacity. Pleura: Similar small bilateral pleural effusions. New vertically oriented curvilinear interface traversing the lateral left lung is favored to represent a skin fold. No definite pneumothorax. Heart/mediastinum: Similar cardiomediastinal silhouette. w Aortic atherosclerosis. Bones: Old right lateral seventh rib fracture. IMPRESSION: 1. Interval removal of left IJ central venous catheter and left chest wall pacemaker. 2. Persistent left retrocardiac atelectasis and mild interstitial edema. 3. Similar small bilateral pleural effusions. 4. Vertically oriented curvilinear interface traversing the lateral left lung is favored to represent a skin fold.  No definite pneumothorax. Attention on follow-up. Electronically Signed   By: Darrin Nipper M.D.   On: 01/29/2022 13:25     Scheduled Meds:  amiodarone  200 mg Oral BID   Followed by   Derrill Memo ON 02/04/2022] amiodarone  200 mg Oral Daily   amLODipine  10 mg Oral Daily   Chlorhexidine Gluconate Cloth  6 each Topical Daily   insulin aspart  0-9 Units Subcutaneous Q4H   lactose free nutrition  237 mL Oral BID BM   lidocaine  1 patch Transdermal Q24H   multivitamin  1 tablet Oral QHS   mouth rinse  15 mL Mouth Rinse 4 times per day   pantoprazole  40 mg Oral Daily   predniSONE  5 mg Oral Q breakfast   saccharomyces boulardii  250 mg Oral BID   senna-docusate  1 tablet Oral BID   sodium chloride flush  3 mL Intravenous Q12H   valACYclovir  1,000 mg Oral TID   vancomycin  125 mg Oral BID   Continuous Infusions:   sodium chloride 20 mL/hr at 01/30/22 1643   sodium chloride 20 mL/hr at 01/30/22 1643    ceFAZolin (ANCEF) IV 2 g (01/31/22 0900)     LOS: 7 days    Time spent: 70mn  PDomenic Polite MD [facility]   01/31/2022, 12:38 PM

## 2022-01-31 NOTE — Progress Notes (Signed)
Peripherally Inserted Central Catheter Placement  The IV Nurse has discussed with the patient and/or persons authorized to consent for the patient, the purpose of this procedure and the potential benefits and risks involved with this procedure.  The benefits include less needle sticks, lab draws from the catheter, and the patient may be discharged home with the catheter. Risks include, but not limited to, infection, bleeding, blood clot (thrombus formation), and puncture of an artery; nerve damage and irregular heartbeat and possibility to perform a PICC exchange if needed/ordered by physician.  Alternatives to this procedure were also discussed.  Bard Power PICC patient education guide, fact sheet on infection prevention and patient information card has been provided to patient /or left at bedside. Telephone consent obtained per [name], daughter.  PICC Placement Documentation  PICC Single Lumen 97/02/63 Right Basilic 37 cm 0 cm (Active)  Indication for Insertion or Continuance of Line Home intravenous therapies (PICC only) 01/31/22 1609  Exposed Catheter (cm) 0 cm 01/31/22 1609  Site Assessment Clean, Dry, Intact 01/31/22 1609  Line Status Flushed;Saline locked;Blood return noted 01/31/22 1609  Dressing Type Transparent;Securing device 01/31/22 1609  Dressing Status Antimicrobial disc in place;Clean, Dry, Intact 01/31/22 1609  Safety Lock Not Applicable 78/58/85 0277  Line Care Connections checked and tightened 01/31/22 1609  Line Adjustment (NICU/IV Team Only) No 01/31/22 1609  Dressing Intervention New dressing 01/31/22 1609  Dressing Change Due 02/07/22 01/31/22 [place] 01/31/2022, 4:11 PM

## 2022-01-31 NOTE — Progress Notes (Signed)
  Inpatient Rehabilitation Admissions Coordinator   Met with daughter and son in law at bedside for rehab assessment. Patient has been transported to MRI.  We discussed goals and expectations of a possible CIR admit. She was at [facility] 11/222 and did well. They prefer CIR for rehab. Family can provide expected caregiver support that is recommended.I feel she is a great candidate for Cir when medically cleared for discharge. I will follow. Please call me with any questions.   Danne Baxter, RN, MSN Rehab Admissions Coordinator [phone]

## 2022-01-31 NOTE — TOC Benefit Eligibility Note (Signed)
Patient Shelby Walker, English as a foreign language completed.    The patient is currently admitted and upon discharge could be taking Sivextro 200 mg tablets.  The current 30 day co-pay is $3,162.97.   The patient is insured through [place], [place] Patient Advocate Specialist [place] Patient Advocate Team Direct Number: [phone]  Fax: [phone]

## 2022-01-31 NOTE — Care Management Important Message (Signed)
Important Message  Patient Details  Name: Shelby Walker MRN: [id number] Date of Birth: [birth year]   Medicare Important Message Given:  Yes     Shelda Altes 01/31/2022, 10:32 AM

## 2022-02-01 ENCOUNTER — Inpatient Hospital Stay (HOSPITAL_COMMUNITY): Payer: Medicare Other

## 2022-02-01 DIAGNOSIS — A419 Sepsis, unspecified organism: Secondary | ICD-10-CM | POA: Diagnosis not present

## 2022-02-01 DIAGNOSIS — B9561 Methicillin susceptible Staphylococcus aureus infection as the cause of diseases classified elsewhere: Secondary | ICD-10-CM | POA: Diagnosis not present

## 2022-02-01 DIAGNOSIS — M4626 Osteomyelitis of vertebra, lumbar region: Secondary | ICD-10-CM

## 2022-02-01 DIAGNOSIS — T827XXA Infection and inflammatory reaction due to other cardiac and vascular devices, implants and grafts, initial encounter: Secondary | ICD-10-CM

## 2022-02-01 DIAGNOSIS — R7881 Bacteremia: Secondary | ICD-10-CM | POA: Diagnosis not present

## 2022-02-01 DIAGNOSIS — T827XXD Infection and inflammatory reaction due to other cardiac and vascular devices, implants and grafts, subsequent encounter: Secondary | ICD-10-CM | POA: Diagnosis not present

## 2022-02-01 DIAGNOSIS — R6521 Severe sepsis with septic shock: Secondary | ICD-10-CM | POA: Diagnosis not present

## 2022-02-01 LAB — GLUCOSE, CAPILLARY
Glucose-Capillary: 113 mg/dL — ABNORMAL HIGH (ref 70–99)
Glucose-Capillary: 132 mg/dL — ABNORMAL HIGH (ref 70–99)
Glucose-Capillary: 189 mg/dL — ABNORMAL HIGH (ref 70–99)
Glucose-Capillary: 252 mg/dL — ABNORMAL HIGH (ref 70–99)
Glucose-Capillary: 139 mg/dL — ABNORMAL HIGH (ref 70–99)

## 2022-02-01 LAB — CBC
HCT: 27.8 % — ABNORMAL LOW (ref 36.0–46.0)
Hemoglobin: 8.9 g/dL — ABNORMAL LOW (ref 12.0–15.0)
MCH: 29.3 pg (ref 26.0–34.0)
MCHC: 32 g/dL (ref 30.0–36.0)
MCV: 91.4 fL (ref 80.0–100.0)
Platelets: 215 10*3/uL (ref 150–400)
RBC: 3.04 MIL/uL — ABNORMAL LOW (ref 3.87–5.11)
RDW: 15.5 % (ref 11.5–15.5)
WBC: 8.8 10*3/uL (ref 4.0–10.5)
nRBC: 0 % (ref 0.0–0.2)

## 2022-02-01 LAB — BASIC METABOLIC PANEL
Anion gap: 11 (ref 5–15)
BUN: 23 mg/dL (ref 8–23)
CO2: 27 mmol/L (ref 22–32)
Calcium: 7.7 mg/dL — ABNORMAL LOW (ref 8.9–10.3)
Chloride: 103 mmol/L (ref 98–111)
Creatinine, Ser: 1.23 mg/dL — ABNORMAL HIGH (ref 0.44–1.00)
GFR, Estimated: 43 mL/min — ABNORMAL LOW (ref >60)
Glucose, Bld: 214 mg/dL — ABNORMAL HIGH (ref 70–99)
Potassium: 4.2 mmol/L (ref 3.5–5.1)
Sodium: 141 mmol/L (ref 135–145)

## 2022-02-01 MED ORDER — POTASSIUM CHLORIDE CRYS ER 20 MEQ PO TBCR
40.0000 meq | EXTENDED_RELEASE_TABLET | Freq: Once | ORAL | Status: AC
  Administered 2022-02-01: 40 meq via ORAL
  Filled 2022-02-01 (×2): qty 2

## 2022-02-01 MED ORDER — MECLIZINE HCL 12.5 MG PO TABS
12.5000 mg | ORAL_TABLET | Freq: Two times a day (BID) | ORAL | Status: DC
  Administered 2022-02-01 – 2022-02-02 (×3): 12.5 mg via ORAL
  Filled 2022-02-01 (×4): qty 1

## 2022-02-01 MED ORDER — MAGIC MOUTHWASH
10.0000 mL | Freq: Four times a day (QID) | ORAL | Status: DC
  Administered 2022-02-01 – 2022-02-02 (×5): 10 mL via ORAL
  Filled 2022-02-01 (×6): qty 10

## 2022-02-01 NOTE — Progress Notes (Signed)
Mobility Specialist Progress Note:   02/01/22 1436  Mobility  Activity Transferred to/from BSC  Level of Assistance Standby assist, set-up cues, supervision of patient - no hands on  Assistive Device BSC  Distance Ambulated (ft) 4 ft  Activity Response Tolerated well  $Mobility charge 1 Mobility   During Mobility:145 HR Post Mobility:  134 HR  Pt received EOB needing to get to BSC. Complaints of stomach pain. Left in bed with call bell in reach and all needs met.     Mobility Specialist Please contact via Secure Chat or  Rehab Office at [phone]  

## 2022-02-01 NOTE — Progress Notes (Signed)
[facility]  Date of Admission:  01/24/2022     Lines:  11/03-11/27 pacer   Abx: 11/29-c PO VANC 11/29-c po valacyclovir 11/22-c cefazolin                                                       Assessment: Mssa bacteremia Severe sepsis with septic shock S/p permanent pacemaker placement Assymptomatic bacteriuria Hx recurrent cdiff; previously severe presentation Troponinemia Aki Elevated lft Elevated inr -- dic r/o'ed Dm2 ckd3   88 yo female with dm2 hx right tma, ckd3, sinus arrest s/p pacer 11/03 admitted for 1 day chill, n/v/diarrhea, chest-abd pain, found to have mssa septic shock, briefly requiring pressor now off on HD#1   11/22 bcx mssa 11/22 ucx <50k ecoli/pseudomonas. I do not think she has GU infection, and this is all mssa sepsis 11/23 stool cdiff testing negative toxin/antigen 11/23 bcx negative   11/22 EKG sinus rhythm without pacer activity 11/22 Ct chest abd pelv showed sign of pulm edema as well and bilateral perinephric stranding; no diverticulitis 11/23 tte no obvious new eccentric regurg or tell tale valve changes for endocarditis     Concern for pacer infection and EP had removed this 11/27 Periop 11/27 tee no valve vegetation Per cardiology, no immediate need of new pace maker  11/29 mri lumbar thoracic spine with L3-4 discitis/om and left psoas edema   stable abd pain/tender on exam. Prior cdiff; no sign of active infection; started on oral po vanc prophy 11/28. Also on probiotics  ---------- 11/30 assessment New zoster like rash left lower lumbar/sacral area. In airborne contact isolation 11/29 Pending CIR discharge     Plan: Continue cefazolin unti 12/06 and transition to oral abx to finish 6 weeks pacer endocarditis treatment Continue prophy po vanc until 1 week off systemic abx Zoster management per primary team Discussed with primary team  I spent more than 35 minute reviewing data/chart,  and coordinating care and >50% direct face to face time providing counseling/discussing diagnostics/treatment plan with patient   Principal Problem:   Severe sepsis with septic shock ([place]) Active Problems:   AKI (acute kidney injury) ([place])   MSSA bacteremia   Presence of cardiac pacemaker   Acute midline low back pain without sciatica   Allergies  Allergen Reactions   Cholestyramine     Possible- Makes throat burn. Patient said she can't take it    Compazine [Prochlorperazine Edisylate] Swelling    REACTION: " tongue swell and unable to swallow"   Sulfonamide Derivatives Other (See Comments)    REACTION: " broke out with fine itching bumps"   Hydrocodone Other (See Comments)   Infliximab Other (See Comments)    Other reaction(s): rash   Leflunomide     Other reaction(s): diarrhea   Prochlorperazine Other (See Comments)    Other reaction(s): Unknown   Rosuvastatin     Other reaction(s): muscle aches   Sulfa Antibiotics Other (See Comments)    Other reaction(s): tongue swelling    Scheduled Meds:  amiodarone  200 mg Oral BID   Followed by   Derrill Memo ON 02/04/2022] amiodarone  200 mg Oral Daily   amLODipine  10 mg Oral Daily   Chlorhexidine Gluconate Cloth  6 each Topical Daily   furosemide  20 mg Oral  Daily   insulin aspart  0-9 Units Subcutaneous Q4H   lactose free nutrition  237 mL Oral BID BM   lidocaine  1 patch Transdermal Q24H   magic mouthwash  10 mL Oral QID   meclizine  12.5 mg Oral BID   multivitamin  1 tablet Oral QHS   mouth rinse  15 mL Mouth Rinse 4 times per day   pantoprazole  40 mg Oral Daily   predniSONE  5 mg Oral Q breakfast   saccharomyces boulardii  250 mg Oral BID   senna-docusate  1 tablet Oral BID   sodium chloride flush  3 mL Intravenous Q12H   valACYclovir  1,000 mg Oral Daily   vancomycin  125 mg Oral BID   Continuous Infusions:  sodium chloride 20 mL/hr at 01/31/22 1650   sodium chloride Stopped (01/31/22 1131)    ceFAZolin (ANCEF) IV  2 g (02/01/22 0903)   PRN Meds:.acetaminophen, docusate sodium, hydrALAZINE, HYDROmorphone (DILAUDID) injection, ondansetron (ZOFRAN) IV, mouth rinse, polyethylene glycol   SUBJECTIVE: Still nausea and abd pain No f/c Back pain stable Taking valtrex per primary team  Pending cir     Review of Systems: ROS All other ROS was negative, except mentioned above     OBJECTIVE: Vitals:   02/01/22 0614 02/01/22 0838 02/01/22 1300 02/01/22 1452  BP:  (!) 125/51 (!) 134/56   Pulse:  79 89   Resp:  '16 19 16  '$ Temp:  98.8 F (37.1 C) 99 F (37.2 C)   TempSrc:  Oral Oral   SpO2:  100% 100%   Weight: 52.8 kg     Height:       Body mass index is 21.97 kg/m.  Physical Exam General/constitutional: no distress, pleasant HEENT: Normocephalic, PER, Conj Clear, EOMI, Oropharynx clear Neck supple CV: rrr no mrg Lungs: clear to auscultation, normal respiratory effort Abd: Soft, mild tenderness diffusely  Ext: no edema Skin: no peripheral ext rash Neuro: nonfocal MSK: no peripheral joint swelling/tenderness/warmth        Lab Results Lab Results  Component Value Date   WBC 8.8 02/01/2022   HGB 8.9 (L) 02/01/2022   HCT 27.8 (L) 02/01/2022   MCV 91.4 02/01/2022   PLT 215 02/01/2022    Lab Results  Component Value Date   CREATININE 1.23 (H) 02/01/2022   BUN 23 02/01/2022   NA 141 02/01/2022   K 4.2 02/01/2022   CL 103 02/01/2022   CO2 27 02/01/2022    Lab Results  Component Value Date   ALT 30 01/29/2022   AST 57 (H) 01/29/2022   ALKPHOS 77 01/29/2022   BILITOT 0.4 01/29/2022      Microbiology: Recent Results (from the past 240 hour(s))  Resp Panel by RT-PCR (Flu A&B, Covid) Anterior Nasal Swab     Status: None   Collection Time: 01/24/22  9:23 AM   Specimen: Anterior Nasal Swab  Result Value Ref Range Status   SARS Coronavirus 2 by RT PCR NEGATIVE NEGATIVE Final    Comment: (NOTE) SARS-CoV-2 target nucleic acids are NOT DETECTED.  The SARS-CoV-2 RNA  is generally detectable in upper respiratory specimens during the acute phase of infection. The lowest concentration of SARS-CoV-2 viral copies this assay can detect is 138 copies/mL. A negative result does not preclude SARS-Cov-2 infection and should not be used as the sole basis for treatment or other patient management decisions. A negative result may occur with  improper specimen collection/handling, submission of specimen other than nasopharyngeal swab, presence  of viral mutation(s) within the areas targeted by this assay, and inadequate number of viral copies(<138 copies/mL). A negative result must be combined with clinical observations, patient history, and epidemiological information. The expected result is Negative.  Fact Sheet for Patients:  EntrepreneurPulse.com.au  Fact Sheet for Healthcare Providers:  IncredibleEmployment.be  This test is no t yet approved or cleared by the [place] FDA and  has been authorized for detection and/or diagnosis of SARS-CoV-2 by FDA under an Emergency Use Authorization (EUA). This EUA will remain  in effect (meaning this test can be used) for the duration of the COVID-19 declaration under Section 564(b)(1) of the Act, 21 U.S.C.section 360bbb-3(b)(1), unless the authorization is terminated  or revoked sooner.       Influenza A by PCR NEGATIVE NEGATIVE Final   Influenza B by PCR NEGATIVE NEGATIVE Final    Comment: (NOTE) The Xpert Xpress SARS-CoV-2/FLU/RSV plus assay is intended as an aid in the diagnosis of influenza from Nasopharyngeal swab specimens and should not be used as a sole basis for treatment. Nasal washings and aspirates are unacceptable for Xpert Xpress SARS-CoV-2/FLU/RSV testing.  Fact Sheet for Patients: EntrepreneurPulse.com.au  Fact Sheet for Healthcare Providers: IncredibleEmployment.be  This test is not yet approved or cleared by the  [place] FDA and has been authorized for detection and/or diagnosis of SARS-CoV-2 by FDA under an Emergency Use Authorization (EUA). This EUA will remain in effect (meaning this test can be used) for the duration of the COVID-19 declaration under Section 564(b)(1) of the Act, 21 U.S.C. section 360bbb-3(b)(1), unless the authorization is terminated or revoked.  Performed at [facility], [place] [address], [place], [place] [address]   Blood Culture (routine x 2)     Status: Abnormal   Collection Time: 01/24/22  9:23 AM   Specimen: BLOOD  Result Value Ref Range Status   Specimen Description BLOOD SITE NOT SPECIFIED  Final   Special Requests   Final    BOTTLES DRAWN AEROBIC AND ANAEROBIC Blood Culture adequate volume   Culture  Setup Time   Final    GRAM POSITIVE COCCI IN PAIRS IN CLUSTERS IN BOTH AEROBIC AND ANAEROBIC BOTTLES CRITICAL RESULT CALLED TO, READ BACK BY AND VERIFIED WITH: T RUDISILL,PHARMD'@0004'$  01/25/22 [place] Performed at [facility], [place] [address] [place], [place] [address]    Culture STAPHYLOCOCCUS AUREUS (A)  Final   Report Status 01/26/2022 FINAL  Final   Organism ID, Bacteria STAPHYLOCOCCUS AUREUS  Final      Susceptibility   Staphylococcus aureus - MIC*    CIPROFLOXACIN <=0.5 SENSITIVE Sensitive     ERYTHROMYCIN <=0.25 SENSITIVE Sensitive     GENTAMICIN <=0.5 SENSITIVE Sensitive     OXACILLIN 0.5 SENSITIVE Sensitive     TETRACYCLINE <=1 SENSITIVE Sensitive     VANCOMYCIN <=0.5 SENSITIVE Sensitive     TRIMETH/SULFA <=10 SENSITIVE Sensitive     CLINDAMYCIN <=0.25 SENSITIVE Sensitive     RIFAMPIN <=0.5 SENSITIVE Sensitive     Inducible Clindamycin NEGATIVE Sensitive     * STAPHYLOCOCCUS AUREUS  Urine Culture     Status: Abnormal   Collection Time: 01/24/22  9:23 AM   Specimen: In/Out Cath Urine  Result Value Ref Range Status   Specimen Description IN/OUT CATH URINE  Final   Special Requests   Final    NONE Performed at [facility],  [place] [address], [place], [place] [address]    Culture (A)  Final    40,000 COLONIES/mL ESCHERICHIA  COLI 40,000 COLONIES/mL PSEUDOMONAS AERUGINOSA    Report Status 01/26/2022 FINAL  Final   Organism ID, Bacteria ESCHERICHIA COLI (A)  Final   Organism ID, Bacteria PSEUDOMONAS AERUGINOSA (A)  Final      Susceptibility   Escherichia coli - MIC*    AMPICILLIN 16 INTERMEDIATE Intermediate     CEFAZOLIN <=4 SENSITIVE Sensitive     CEFEPIME <=0.12 SENSITIVE Sensitive     CEFTRIAXONE <=0.25 SENSITIVE Sensitive     CIPROFLOXACIN <=0.25 SENSITIVE Sensitive     GENTAMICIN <=1 SENSITIVE Sensitive     IMIPENEM <=0.25 SENSITIVE Sensitive     NITROFURANTOIN 32 SENSITIVE Sensitive     TRIMETH/SULFA <=20 SENSITIVE Sensitive     AMPICILLIN/SULBACTAM 4 SENSITIVE Sensitive     PIP/TAZO <=4 SENSITIVE Sensitive     * 40,000 COLONIES/mL ESCHERICHIA COLI   Pseudomonas aeruginosa - MIC*    CEFTAZIDIME 2 SENSITIVE Sensitive     CIPROFLOXACIN 0.5 SENSITIVE Sensitive     GENTAMICIN <=1 SENSITIVE Sensitive     IMIPENEM 2 SENSITIVE Sensitive     PIP/TAZO 8 SENSITIVE Sensitive     CEFEPIME 4 SENSITIVE Sensitive     * 40,000 COLONIES/mL PSEUDOMONAS AERUGINOSA  Blood Culture ID Panel (Reflexed)     Status: Abnormal   Collection Time: 01/24/22  9:23 AM  Result Value Ref Range Status   Enterococcus faecalis NOT DETECTED NOT DETECTED Final   Enterococcus Faecium NOT DETECTED NOT DETECTED Final   Listeria monocytogenes NOT DETECTED NOT DETECTED Final   Staphylococcus species DETECTED (A) NOT DETECTED Final    Comment: CRITICAL RESULT CALLED TO, READ BACK BY AND VERIFIED WITH: T RUDISILL,PHARMD'@0004'$  01/25/22 [place]    Staphylococcus aureus (BCID) DETECTED (A) NOT DETECTED Final    Comment: CRITICAL RESULT CALLED TO, READ BACK BY AND VERIFIED WITH: T RUDISILL,PHARMD'@0004'$  01/25/22 [place]    Staphylococcus epidermidis NOT DETECTED NOT DETECTED Final   Staphylococcus lugdunensis NOT DETECTED NOT DETECTED Final    Streptococcus species NOT DETECTED NOT DETECTED Final   Streptococcus agalactiae NOT DETECTED NOT DETECTED Final   Streptococcus pneumoniae NOT DETECTED NOT DETECTED Final   Streptococcus pyogenes NOT DETECTED NOT DETECTED Final   A.calcoaceticus-baumannii NOT DETECTED NOT DETECTED Final   Bacteroides fragilis NOT DETECTED NOT DETECTED Final   Enterobacterales NOT DETECTED NOT DETECTED Final   Enterobacter cloacae complex NOT DETECTED NOT DETECTED Final   Escherichia coli NOT DETECTED NOT DETECTED Final   Klebsiella aerogenes NOT DETECTED NOT DETECTED Final   Klebsiella oxytoca NOT DETECTED NOT DETECTED Final   Klebsiella pneumoniae NOT DETECTED NOT DETECTED Final   Proteus species NOT DETECTED NOT DETECTED Final   Salmonella species NOT DETECTED NOT DETECTED Final   Serratia marcescens NOT DETECTED NOT DETECTED Final   Haemophilus influenzae NOT DETECTED NOT DETECTED Final   Neisseria meningitidis NOT DETECTED NOT DETECTED Final   Pseudomonas aeruginosa NOT DETECTED NOT DETECTED Final   Stenotrophomonas maltophilia NOT DETECTED NOT DETECTED Final   Candida albicans NOT DETECTED NOT DETECTED Final   Candida auris NOT DETECTED NOT DETECTED Final   Candida glabrata NOT DETECTED NOT DETECTED Final   Candida krusei NOT DETECTED NOT DETECTED Final   Candida parapsilosis NOT DETECTED NOT DETECTED Final   Candida tropicalis NOT DETECTED NOT DETECTED Final   Cryptococcus neoformans/gattii NOT DETECTED NOT DETECTED Final   Meth resistant mecA/C and MREJ NOT DETECTED NOT DETECTED Final    Comment: Performed at [facility], [address] [place], [place] [address]  Blood Culture (routine x  2)     Status: Abnormal   Collection Time: 01/24/22  9:28 AM   Specimen: BLOOD  Result Value Ref Range Status   Specimen Description BLOOD SITE NOT SPECIFIED  Final   Special Requests   Final    BOTTLES DRAWN AEROBIC AND ANAEROBIC Blood Culture results may not be optimal due to an excessive volume  of blood received in culture bottles   Culture  Setup Time   Final    GRAM POSITIVE COCCI IN PAIRS IN BOTH AEROBIC AND ANAEROBIC BOTTLES CRITICAL VALUE NOTED.  VALUE IS CONSISTENT WITH PREVIOUSLY REPORTED AND CALLED VALUE.    Culture (A)  Final    STAPHYLOCOCCUS AUREUS SUSCEPTIBILITIES PERFORMED ON PREVIOUS CULTURE WITHIN THE LAST 5 DAYS. Performed at [facility], [place] [address], [place], [place] [address]    Report Status 01/26/2022 FINAL  Final  Gastrointestinal Panel by PCR , Stool     Status: None   Collection Time: 01/24/22 10:36 AM   Specimen: Stool  Result Value Ref Range Status   Campylobacter species NOT DETECTED NOT DETECTED Final   Plesimonas shigelloides NOT DETECTED NOT DETECTED Final   Salmonella species NOT DETECTED NOT DETECTED Final   Yersinia enterocolitica NOT DETECTED NOT DETECTED Final   Vibrio species NOT DETECTED NOT DETECTED Final   Vibrio cholerae NOT DETECTED NOT DETECTED Final   Enteroaggregative E coli (EAEC) NOT DETECTED NOT DETECTED Final   Enteropathogenic E coli (EPEC) NOT DETECTED NOT DETECTED Final   Enterotoxigenic E coli (ETEC) NOT DETECTED NOT DETECTED Final   Shiga like toxin producing E coli (STEC) NOT DETECTED NOT DETECTED Final   Shigella/Enteroinvasive E coli (EIEC) NOT DETECTED NOT DETECTED Final   Cryptosporidium NOT DETECTED NOT DETECTED Final   Cyclospora cayetanensis NOT DETECTED NOT DETECTED Final   Entamoeba histolytica NOT DETECTED NOT DETECTED Final   Giardia lamblia NOT DETECTED NOT DETECTED Final   Adenovirus F40/41 NOT DETECTED NOT DETECTED Final   Astrovirus NOT DETECTED NOT DETECTED Final   Norovirus GI/GII NOT DETECTED NOT DETECTED Final   Rotavirus A NOT DETECTED NOT DETECTED Final   Sapovirus (I, II, IV, and V) NOT DETECTED NOT DETECTED Final    Comment: Performed at [facility], [place] [place], [place] [address]  MRSA Next Gen by PCR, Nasal     Status: None   Collection Time: 01/24/22 11:00 PM    Specimen: Nasal Mucosa; Nasal Swab  Result Value Ref Range Status   MRSA by PCR Next Gen NOT DETECTED NOT DETECTED Final    Comment: (NOTE) The GeneXpert MRSA Assay (FDA approved for NASAL specimens only), is one component of a comprehensive MRSA colonization surveillance program. It is not intended to diagnose MRSA infection nor to guide or monitor treatment for MRSA infections. Test performance is not FDA approved in patients less than 44 years old. Performed at [facility], [place] [address] [place], [place] [address]   C Difficile Quick Screen w PCR reflex     Status: None   Collection Time: 01/25/22  5:14 AM   Specimen: STOOL  Result Value Ref Range Status   C Diff antigen NEGATIVE NEGATIVE Final   C Diff toxin NEGATIVE NEGATIVE Final   C Diff interpretation No C. difficile detected.  Final    Comment: Performed at [facility], [place] [address] [place], [place] [address]  Culture, blood (Routine X 2) w Reflex to ID Panel     Status: None  Collection Time: 01/25/22  2:56 PM   Specimen: BLOOD  Result Value Ref Range Status   Specimen Description BLOOD SITE NOT SPECIFIED  Final   Special Requests   Final    BOTTLES DRAWN AEROBIC AND ANAEROBIC Blood Culture adequate volume   Culture   Final    NO GROWTH 5 DAYS Performed at [facility], [address], [place], [place] [address]    Report Status 01/30/2022 FINAL  Final  Culture, blood (Routine X 2) w Reflex to ID Panel     Status: None   Collection Time: 01/25/22  2:56 PM   Specimen: BLOOD  Result Value Ref Range Status   Specimen Description BLOOD SITE NOT SPECIFIED  Final   Special Requests   Final    BOTTLES DRAWN AEROBIC AND ANAEROBIC Blood Culture adequate volume   Culture   Final    NO GROWTH 5 DAYS Performed at [facility], [place] [address], [place],  [address]    Report Status 01/30/2022 FINAL  Final     Serology:   Imaging: If present, new imagings (plain films, ct scans,  and mri) have been personally visualized and interpreted; radiology reports have been reviewed. Decision making incorporated into the Impression / Recommendations.  11/29 mri t-l spine 1. Compared with previous lumbar MRI of 11/23/2020, there is new discal T2 hyperintensity at L3-4 which is favored to be degenerative, although early discitis is difficult to exclude. There is a small amount of edema within the adjacent left psoas muscle. No other paraspinal inflammatory changes or focal fluid collections identified. No specific evidence of osteomyelitis. If spinal infection remains a clinical concern, suggest short-term follow-up of the lumbar spine without and with contrast. 2. No evidence of discitis or osteomyelitis in the thoracic spine. 3. Multilevel spondylosis, similar to previous MRI. There is chronic multifactorial spinal stenosis at L3-4 (moderate) and L4-5 (severe). Additional details above.  11/27 tee 1. Left ventricular ejection fraction, by estimation, is 50 to 55%. The  left ventricle has low normal function.   2. Right ventricular systolic function is normal. The right ventricular  size is normal. There is normal pulmonary artery systolic pressure.   3. Left atrial size was moderately dilated. No left atrial/left atrial  appendage thrombus was detected.   4. The mitral valve is degenerative. Mild mitral valve regurgitation. No  evidence of mitral stenosis.   5. The aortic valve is tricuspid. There is mild calcification of the  aortic valve. There is mild thickening of the aortic valve. Aortic valve  regurgitation is not visualized. Aortic valve sclerosis is present, with  no evidence of aortic valve stenosis.   6. There is mild (Grade II) layered plaque involving the descending aorta  and aortic arch.   Conclusion(s)/Recommendation(s): No evidence of vegetation/infective  endocarditis on this transesophageael echocardiogram.   01/24/22 [place] lung scan No pe  Jabier Mutton, [facility] [phone] pager    02/01/2022, 3:32 PM

## 2022-02-01 NOTE — Progress Notes (Signed)
PROGRESS NOTE  Shelby Walker ZWC:[id number] DOB: March 22, 1933 DOA: 01/24/2022 PCP: Reynold Bowen, MD   LOS: 8 days   Brief Narrative / Interim history: 88 year old female with recurrent C. difficile colitis, chronic diastolic CHF, mitral regurgitation, CAD, PAF, DM 2, psoriasis, RA, history of PAD with transmetatarsal amputation 11/22, recent hospitalization with prolonged sinus pauses status post pacemaker implantation 01/05/2022, admitted to the ICU on 11/22 with septic shock, later found to be due to MSSA bacteremia.  EP was consulted and PPM was removed 11/27, also found to have shingles  Subjective / 24h Interval events: Complains of nausea this morning, daughter tells me she has been nauseous since being here and in the ICU.  Its mainly when she moves her head and is associated with dizziness.  Assesement and Plan: Principal Problem:   Severe sepsis with septic shock (HCC) Active Problems:   AKI (acute kidney injury) ([place])   MSSA bacteremia   Presence of cardiac pacemaker   Acute midline low back pain without sciatica   Principal problem Septic shock due to MSSA bacteremia - poa.  She recently had a PPM placed on 11/3 for long sinus pauses, EP consulted and this is now been removed.  She was started on amiodarone, current rhythm is stable without events.  Infectious disease consulted, and with clear surveillance cultures she had a PICC line placed, ID recommends cefazolin for a week followed by several weeks of linezolid.  2D echo did not show any endocarditis.  An MRI of the lumbar and thoracic spine showed perhaps early discitis but no significant abscesses.  Active problems  Transient pleuritic chest pain 11/26 -Resolved, she had a VQ scan few days ago with was unremarkable, monitor clinically  Nausea, vomiting -over the past week.  Reports that she gets dizzy with movement, followed by nausea.  Requested PT eval again, also placed on meclizine.  For completeness I will obtain an  abdominal x-ray to rule out ileus but this is less likely given that she is having bowel movements.  AKI on CKD2 -baseline creat around 1.2, peaked at 2.6. In the setting of septic shock, now improving,   Hypokalemia -Replaced   Elevated troponin, history of CAD and carotid artery disease -Medical management was recommended as outpatient in the setting of advanced age. Echo with preserved EF, no wall motion abnormalities. Coronary CTA 10/23 noted moderate (50-69%) mixed plaque in the left main and LAD.Can not rule out severe (>70%) splenosis in OM1. Avoid BB w/ recent long sinus pauses and allergic to statin. PCCM discussed with cards 11/23, felt to be demand related in the setting of septic shock, echo with preserved EF, no WMA, no role for Anticoagulation/LHC with DIC, AKI, supportive care recommended, unclear why troponins were checked in the ICU   Thrombocytopenia, DIC -In the setting of septic shock, bacteremia,. Antibiotics as noted above, iproving   Urinary retention -Noted in ICU, catheter was discontinued   Paroxysmal atrial fibrillation - Amiodarone 200 Mg twice daily X 5 days followed by 200 Mg daily until follow-up, family would like to hold off on anticoagulation at this time with frequent falls   Chronic diastolic CHF -Oral Lasix today echo with preserved EF   Rheumatoid arthritis steroid-dependent - Dr Amil Amen -she was started on stress dose steroids in the ICU, resumed prednisone, on 4 Mg daily at baseline   H/o recurrent Cdiff -now on Abx as above, ID recommends twice daily oral vancomycin for the duration of her MSSA antibiotics. Denies diarrhea at this  time, continue floraster    Chronic anemia - hgb 8-9gm%. At baseline, monitor    Chronic pain from RA-At baseline on gabapentin, tramadol, Tylenol 3. Continue tramadol, gabapentin at lower dose   Diabetes mellitus with complications -Sliding scale insulin protocol  CBG (last 3)  Recent Labs    01/31/22 2105  02/01/22 0340 02/01/22 0833  GLUCAP 156* 113* 132*    Scheduled Meds:  amiodarone  200 mg Oral BID   Followed by   Derrill Memo ON 02/04/2022] amiodarone  200 mg Oral Daily   amLODipine  10 mg Oral Daily   Chlorhexidine Gluconate Cloth  6 each Topical Daily   furosemide  20 mg Oral Daily   insulin aspart  0-9 Units Subcutaneous Q4H   lactose free nutrition  237 mL Oral BID BM   lidocaine  1 patch Transdermal Q24H   meclizine  12.5 mg Oral BID   multivitamin  1 tablet Oral QHS   mouth rinse  15 mL Mouth Rinse 4 times per day   pantoprazole  40 mg Oral Daily   potassium chloride  40 mEq Oral Once   predniSONE  5 mg Oral Q breakfast   saccharomyces boulardii  250 mg Oral BID   senna-docusate  1 tablet Oral BID   sodium chloride flush  3 mL Intravenous Q12H   valACYclovir  1,000 mg Oral Daily   vancomycin  125 mg Oral BID   Continuous Infusions:  sodium chloride 20 mL/hr at 01/31/22 1650   sodium chloride Stopped (01/31/22 1131)    ceFAZolin (ANCEF) IV 2 g (02/01/22 0903)   PRN Meds:.acetaminophen, docusate sodium, hydrALAZINE, HYDROmorphone (DILAUDID) injection, ondansetron (ZOFRAN) IV, mouth rinse, polyethylene glycol  Current Outpatient Medications  Medication Instructions   acetaminophen (TYLENOL) 1,300 mg, Oral, Every 8 hours PRN   acetaminophen-codeine (TYLENOL #3) 300-30 MG tablet 1 tablet, Oral, 3 times daily   atorvastatin (LIPITOR) 20 mg, Oral, Daily   Calcium Carbonate-Vitamin D 600-400 MG-UNIT tablet 1 tablet, Oral, Daily,     carvedilol (COREG) 6.25 mg, Oral, 2 times daily with meals   diclofenac Sodium (VOLTAREN) 2 g, Topical, 4 times daily   esomeprazole (NEXIUM) 20 mg, Oral, Every morning   gabapentin (NEURONTIN) 100 mg, Oral, 3 times daily   insulin aspart (NOVOLOG) 3 Units, Subcutaneous, 3 times daily with meals, Sliding scale   Insulin Degludec (TRESIBA [place]) 6-8 Units, Subcutaneous, Daily at bedtime   lip balm (CARMEX) ointment Topical, As needed   liver  oil-zinc oxide (DESITIN) 40 % ointment 1 application , Topical, 3 times daily, To raw areas in between/on buttocks   Multiple Vitamins-Minerals (CENTRUM SILVER 50+WOMEN PO) 1 tablet, Oral, Daily   nitroGLYCERIN (NITROSTAT) 0.4 MG SL tablet Dissolve 1 tablet under the tongue every 5 minutes as needed for chest pain. Max of 3 doses, then 911.   Nutritional Supplements (ENSURE ACTIVE) LIQD 237 mLs, Oral, Every morning, Prefers Chocolate   pyrithione zinc (SELSUN BLUE DRY SCALP) 1 % shampoo 1 application , Topical, Weekly   RESTASIS 0.05 % ophthalmic emulsion 1 drop, Both Eyes, 2 times daily   saccharomyces boulardii (FLORASTOR) 250 mg, Oral, 2 times daily   traMADol (ULTRAM) 50 mg, Oral, 2 times daily PRN    Diet Orders (From admission, onward)     Start     Ordered   01/29/22 1234  Diet Heart Room service appropriate? Yes; Fluid consistency: Thin  Diet effective now       Question Answer Comment  Room service appropriate?  Yes   Fluid consistency: Thin      01/29/22 1233            DVT prophylaxis: Place and maintain sequential compression device Start: 01/29/22 1550 SCDs Start: 01/29/22 1234 SCDs Start: 01/24/22 1129   Lab Results  Component Value Date   PLT 178 01/31/2022      Code Status: DNR  Family Communication: daughter at bedside   Status is: Inpatient  Remains inpatient appropriate because: n, v, CIR pending   Level of care: Progressive  Consultants:  ID Cardiology PCCM  Objective: Vitals:   01/31/22 2103 02/01/22 0343 02/01/22 0614 02/01/22 0838  BP: (!) 113/54 (!) 129/49  (!) 125/51  Pulse: 90 76  79  Resp: '17 11  16  '$ Temp: 99.2 F (37.3 C) 98.4 F (36.9 C)  98.8 F (37.1 C)  TempSrc: Oral Oral  Oral  SpO2: 98% 100%  100%  Weight:   52.8 kg   Height:        Intake/Output Summary (Last 24 hours) at 02/01/2022 1211 Last data filed at 02/01/2022 0200 Gross per 24 hour  Intake 827.34 ml  Output 1100 ml  Net -272.66 ml   Wt Readings from  Last 3 Encounters:  02/01/22 52.8 kg  01/06/22 50.3 kg  11/16/21 53.6 kg    Examination:  Constitutional: NAD Eyes: no scleral icterus ENMT: Mucous membranes are moist.  Neck: normal, supple Respiratory: clear to auscultation bilaterally, no wheezing, no crackles.  Cardiovascular: Regular rate and rhythm, no murmurs / rubs / gallops.  Abdomen: non distended, no tenderness. Bowel sounds positive.  Musculoskeletal: no clubbing / cyanosis.  Skin: no rashes Neurologic: non focal   Data Reviewed: I have independently reviewed following labs and imaging studies   CBC Recent Labs  Lab 01/27/22 0507 01/28/22 0117 01/29/22 0136 01/30/22 0127 01/31/22 1057  WBC 14.5* 10.3 11.2* 8.3 8.3  HGB 7.8* 7.9* 9.4* 9.5* 9.7*  HCT 23.7* 23.5* 28.6* 29.9* 29.8*  PLT 91* 93* 133* 142* 178  MCV 90.5 89.0 89.9 92.3 91.1  MCH 29.8 29.9 29.6 29.3 29.7  MCHC 32.9 33.6 32.9 31.8 32.6  RDW 15.7* 15.3 15.5 15.6* 15.4    Recent Labs  Lab 01/26/22 0522 01/27/22 0507 01/28/22 0117 01/29/22 0136 01/30/22 0127 01/31/22 1057  NA 142 135 144 143 143 142  K 3.7 3.2* 2.5* 3.5 4.2 3.4*  CL 103 104 106 105 102 101  CO2 21* 21* '26 28 26 29  '$ GLUCOSE 151* 124* 210* 192* 174* 143*  BUN 54* 52* 45* 44* 36* 25*  CREATININE 2.26* 1.87* 1.61* 1.47* 1.35* 1.24*  CALCIUM 8.1* 7.2* 7.5* 7.5* 7.5* 7.5*  AST  --  212* 113* 57*  --   --   ALT  --  116* 68* 30  --   --   ALKPHOS  --  79 68 77  --   --   BILITOT  --  0.7 0.8 0.4  --   --   ALBUMIN  --  2.4* 2.3* 2.5*  --   --   MG 2.9*  --   --   --   --   --   PROCALCITON 30.42  --   --   --   --   --   LATICACIDVEN 1.8  --   --   --   --   --     ------------------------------------------------------------------------------------------------------------------ No results for input(s): "CHOL", "HDL", "LDLCALC", "TRIG", "CHOLHDL", "LDLDIRECT" in the last 72 hours.  Lab Results  Component Value Date   HGBA1C 7.3 (H) 01/24/2022    ------------------------------------------------------------------------------------------------------------------ No results for input(s): "TSH", "T4TOTAL", "T3FREE", "THYROIDAB" in the last 72 hours.  Invalid input(s): "FREET3"  Cardiac Enzymes No results for input(s): "CKMB", "TROPONINI", "MYOGLOBIN" in the last 168 hours.  Invalid input(s): "CK" ------------------------------------------------------------------------------------------------------------------    Component Value Date/Time   BNP 2,919.6 (H) 01/25/2022 0934    CBG: Recent Labs  Lab 01/31/22 1352 01/31/22 1626 01/31/22 2105 02/01/22 0340 02/01/22 0833  GLUCAP 158* 214* 156* 113* 132*    Recent Results (from the past 240 hour(s))  Resp Panel by RT-PCR (Flu A&B, Covid) Anterior Nasal Swab     Status: None   Collection Time: 01/24/22  9:23 AM   Specimen: Anterior Nasal Swab  Result Value Ref Range Status   SARS Coronavirus 2 by RT PCR NEGATIVE NEGATIVE Final    Comment: (NOTE) SARS-CoV-2 target nucleic acids are NOT DETECTED.  The SARS-CoV-2 RNA is generally detectable in upper respiratory specimens during the acute phase of infection. The lowest concentration of SARS-CoV-2 viral copies this assay can detect is 138 copies/mL. A negative result does not preclude SARS-Cov-2 infection and should not be used as the sole basis for treatment or other patient management decisions. A negative result may occur with  improper specimen collection/handling, submission of specimen other than nasopharyngeal swab, presence of viral mutation(s) within the areas targeted by this assay, and inadequate number of viral copies(<138 copies/mL). A negative result must be combined with clinical observations, patient history, and epidemiological information. The expected result is Negative.  Fact Sheet for Patients:  EntrepreneurPulse.com.au  Fact Sheet for Healthcare Providers:   IncredibleEmployment.be  This test is no t yet approved or cleared by the [place] FDA and  has been authorized for detection and/or diagnosis of SARS-CoV-2 by FDA under an Emergency Use Authorization (EUA). This EUA will remain  in effect (meaning this test can be used) for the duration of the COVID-19 declaration under Section 564(b)(1) of the Act, 21 U.S.C.section 360bbb-3(b)(1), unless the authorization is terminated  or revoked sooner.       Influenza A by PCR NEGATIVE NEGATIVE Final   Influenza B by PCR NEGATIVE NEGATIVE Final    Comment: (NOTE) The Xpert Xpress SARS-CoV-2/FLU/RSV plus assay is intended as an aid in the diagnosis of influenza from Nasopharyngeal swab specimens and should not be used as a sole basis for treatment. Nasal washings and aspirates are unacceptable for Xpert Xpress SARS-CoV-2/FLU/RSV testing.  Fact Sheet for Patients: EntrepreneurPulse.com.au  Fact Sheet for Healthcare Providers: IncredibleEmployment.be  This test is not yet approved or cleared by the [place] FDA and has been authorized for detection and/or diagnosis of SARS-CoV-2 by FDA under an Emergency Use Authorization (EUA). This EUA will remain in effect (meaning this test can be used) for the duration of the COVID-19 declaration under Section 564(b)(1) of the Act, 21 U.S.C. section 360bbb-3(b)(1), unless the authorization is terminated or revoked.  Performed at [facility], [place] [address] [place],  [address]   Blood Culture (routine x 2)     Status: Abnormal   Collection Time: 01/24/22  9:23 AM   Specimen: BLOOD  Result Value Ref Range Status   Specimen Description BLOOD SITE NOT SPECIFIED  Final   Special Requests   Final    BOTTLES DRAWN AEROBIC AND ANAEROBIC Blood Culture adequate volume   Culture  Setup Time   Final    GRAM POSITIVE COCCI IN PAIRS IN  CLUSTERS IN BOTH AEROBIC AND ANAEROBIC  BOTTLES CRITICAL RESULT CALLED TO, READ BACK BY AND VERIFIED WITH: T RUDISILL,PHARMD'@0004'$  01/25/22 [place] Performed at [facility], [place] [address] [place], [place] [address]    Culture STAPHYLOCOCCUS AUREUS (A)  Final   Report Status 01/26/2022 FINAL  Final   Organism ID, Bacteria STAPHYLOCOCCUS AUREUS  Final      Susceptibility   Staphylococcus aureus - MIC*    CIPROFLOXACIN <=0.5 SENSITIVE Sensitive     ERYTHROMYCIN <=0.25 SENSITIVE Sensitive     GENTAMICIN <=0.5 SENSITIVE Sensitive     OXACILLIN 0.5 SENSITIVE Sensitive     TETRACYCLINE <=1 SENSITIVE Sensitive     VANCOMYCIN <=0.5 SENSITIVE Sensitive     TRIMETH/SULFA <=10 SENSITIVE Sensitive     CLINDAMYCIN <=0.25 SENSITIVE Sensitive     RIFAMPIN <=0.5 SENSITIVE Sensitive     Inducible Clindamycin NEGATIVE Sensitive     * STAPHYLOCOCCUS AUREUS  Urine Culture     Status: Abnormal   Collection Time: 01/24/22  9:23 AM   Specimen: In/Out Cath Urine  Result Value Ref Range Status   Specimen Description IN/OUT CATH URINE  Final   Special Requests   Final    NONE Performed at [facility], [place] [address], [place], [place] [address]    Culture (A)  Final    40,000 COLONIES/mL ESCHERICHIA COLI 40,000 COLONIES/mL PSEUDOMONAS AERUGINOSA    Report Status 01/26/2022 FINAL  Final   Organism ID, Bacteria ESCHERICHIA COLI (A)  Final   Organism ID, Bacteria PSEUDOMONAS AERUGINOSA (A)  Final      Susceptibility   Escherichia coli - MIC*    AMPICILLIN 16 INTERMEDIATE Intermediate     CEFAZOLIN <=4 SENSITIVE Sensitive     CEFEPIME <=0.12 SENSITIVE Sensitive     CEFTRIAXONE <=0.25 SENSITIVE Sensitive     CIPROFLOXACIN <=0.25 SENSITIVE Sensitive     GENTAMICIN <=1 SENSITIVE Sensitive     IMIPENEM <=0.25 SENSITIVE Sensitive     NITROFURANTOIN 32 SENSITIVE Sensitive     TRIMETH/SULFA <=20 SENSITIVE Sensitive     AMPICILLIN/SULBACTAM 4 SENSITIVE Sensitive     PIP/TAZO <=4 SENSITIVE Sensitive     * 40,000 COLONIES/mL ESCHERICHIA  COLI   Pseudomonas aeruginosa - MIC*    CEFTAZIDIME 2 SENSITIVE Sensitive     CIPROFLOXACIN 0.5 SENSITIVE Sensitive     GENTAMICIN <=1 SENSITIVE Sensitive     IMIPENEM 2 SENSITIVE Sensitive     PIP/TAZO 8 SENSITIVE Sensitive     CEFEPIME 4 SENSITIVE Sensitive     * 40,000 COLONIES/mL PSEUDOMONAS AERUGINOSA  Blood Culture ID Panel (Reflexed)     Status: Abnormal   Collection Time: 01/24/22  9:23 AM  Result Value Ref Range Status   Enterococcus faecalis NOT DETECTED NOT DETECTED Final   Enterococcus Faecium NOT DETECTED NOT DETECTED Final   Listeria monocytogenes NOT DETECTED NOT DETECTED Final   Staphylococcus species DETECTED (A) NOT DETECTED Final    Comment: CRITICAL RESULT CALLED TO, READ BACK BY AND VERIFIED WITH: T RUDISILL,PHARMD'@0004'$  01/25/22 [place]    Staphylococcus aureus (BCID) DETECTED (A) NOT DETECTED Final    Comment: CRITICAL RESULT CALLED TO, READ BACK BY AND VERIFIED WITH: T RUDISILL,PHARMD'@0004'$  01/25/22 [place]    Staphylococcus epidermidis NOT DETECTED NOT DETECTED Final   Staphylococcus lugdunensis NOT DETECTED NOT DETECTED Final   Streptococcus species NOT DETECTED NOT DETECTED Final   Streptococcus agalactiae NOT DETECTED NOT DETECTED Final   Streptococcus pneumoniae NOT DETECTED NOT DETECTED Final   Streptococcus pyogenes NOT DETECTED  NOT DETECTED Final   A.calcoaceticus-baumannii NOT DETECTED NOT DETECTED Final   Bacteroides fragilis NOT DETECTED NOT DETECTED Final   Enterobacterales NOT DETECTED NOT DETECTED Final   Enterobacter cloacae complex NOT DETECTED NOT DETECTED Final   Escherichia coli NOT DETECTED NOT DETECTED Final   Klebsiella aerogenes NOT DETECTED NOT DETECTED Final   Klebsiella oxytoca NOT DETECTED NOT DETECTED Final   Klebsiella pneumoniae NOT DETECTED NOT DETECTED Final   Proteus species NOT DETECTED NOT DETECTED Final   Salmonella species NOT DETECTED NOT DETECTED Final   Serratia marcescens NOT DETECTED NOT DETECTED Final   Haemophilus  influenzae NOT DETECTED NOT DETECTED Final   Neisseria meningitidis NOT DETECTED NOT DETECTED Final   Pseudomonas aeruginosa NOT DETECTED NOT DETECTED Final   Stenotrophomonas maltophilia NOT DETECTED NOT DETECTED Final   Candida albicans NOT DETECTED NOT DETECTED Final   Candida auris NOT DETECTED NOT DETECTED Final   Candida glabrata NOT DETECTED NOT DETECTED Final   Candida krusei NOT DETECTED NOT DETECTED Final   Candida parapsilosis NOT DETECTED NOT DETECTED Final   Candida tropicalis NOT DETECTED NOT DETECTED Final   Cryptococcus neoformans/gattii NOT DETECTED NOT DETECTED Final   Meth resistant mecA/C and MREJ NOT DETECTED NOT DETECTED Final    Comment: Performed at [facility], [address], [place], [place] [address]  Blood Culture (routine x 2)     Status: Abnormal   Collection Time: 01/24/22  9:28 AM   Specimen: BLOOD  Result Value Ref Range Status   Specimen Description BLOOD SITE NOT SPECIFIED  Final   Special Requests   Final    BOTTLES DRAWN AEROBIC AND ANAEROBIC Blood Culture results may not be optimal due to an excessive volume of blood received in culture bottles   Culture  Setup Time   Final    GRAM POSITIVE COCCI IN PAIRS IN BOTH AEROBIC AND ANAEROBIC BOTTLES CRITICAL VALUE NOTED.  VALUE IS CONSISTENT WITH PREVIOUSLY REPORTED AND CALLED VALUE.    Culture (A)  Final    STAPHYLOCOCCUS AUREUS SUSCEPTIBILITIES PERFORMED ON PREVIOUS CULTURE WITHIN THE LAST 5 DAYS. Performed at [facility], [place] [address] [place], [place] [address]    Report Status 01/26/2022 FINAL  Final  Gastrointestinal Panel by PCR , Stool     Status: None   Collection Time: 01/24/22 10:36 AM   Specimen: Stool  Result Value Ref Range Status   Campylobacter species NOT DETECTED NOT DETECTED Final   Plesimonas shigelloides NOT DETECTED NOT DETECTED Final   Salmonella species NOT DETECTED NOT DETECTED Final   Yersinia enterocolitica NOT DETECTED NOT DETECTED Final   Vibrio  species NOT DETECTED NOT DETECTED Final   Vibrio cholerae NOT DETECTED NOT DETECTED Final   Enteroaggregative E coli (EAEC) NOT DETECTED NOT DETECTED Final   Enteropathogenic E coli (EPEC) NOT DETECTED NOT DETECTED Final   Enterotoxigenic E coli (ETEC) NOT DETECTED NOT DETECTED Final   Shiga like toxin producing E coli (STEC) NOT DETECTED NOT DETECTED Final   Shigella/Enteroinvasive E coli (EIEC) NOT DETECTED NOT DETECTED Final   Cryptosporidium NOT DETECTED NOT DETECTED Final   Cyclospora cayetanensis NOT DETECTED NOT DETECTED Final   Entamoeba histolytica NOT DETECTED NOT DETECTED Final   Giardia lamblia NOT DETECTED NOT DETECTED Final   Adenovirus F40/41 NOT DETECTED NOT DETECTED Final   Astrovirus NOT DETECTED NOT DETECTED Final   Norovirus GI/GII NOT DETECTED NOT DETECTED Final   Rotavirus A NOT DETECTED NOT DETECTED Final   Sapovirus (I,  II, IV, and V) NOT DETECTED NOT DETECTED Final    Comment: Performed at [facility], [place] [place], [place] [address]  MRSA Next Gen by PCR, Nasal     Status: None   Collection Time: 01/24/22 11:00 PM   Specimen: Nasal Mucosa; Nasal Swab  Result Value Ref Range Status   MRSA by PCR Next Gen NOT DETECTED NOT DETECTED Final    Comment: (NOTE) The GeneXpert MRSA Assay (FDA approved for NASAL specimens only), is one component of a comprehensive MRSA colonization surveillance program. It is not intended to diagnose MRSA infection nor to guide or monitor treatment for MRSA infections. Test performance is not FDA approved in patients less than 65 years old. Performed at [facility], [place] [address], [place], [place] [address]   C Difficile Quick Screen w PCR reflex     Status: None   Collection Time: 01/25/22  5:14 AM   Specimen: STOOL  Result Value Ref Range Status   C Diff antigen NEGATIVE NEGATIVE Final   C Diff toxin NEGATIVE NEGATIVE Final   C Diff interpretation No C. difficile detected.  Final    Comment:  Performed at [facility], [place] [address], [place], [place] [address]  Culture, blood (Routine X 2) w Reflex to ID Panel     Status: None   Collection Time: 01/25/22  2:56 PM   Specimen: BLOOD  Result Value Ref Range Status   Specimen Description BLOOD SITE NOT SPECIFIED  Final   Special Requests   Final    BOTTLES DRAWN AEROBIC AND ANAEROBIC Blood Culture adequate volume   Culture   Final    NO GROWTH 5 DAYS Performed at [facility], [address], [place], [place] [address]    Report Status 01/30/2022 FINAL  Final  Culture, blood (Routine X 2) w Reflex to ID Panel     Status: None   Collection Time: 01/25/22  2:56 PM   Specimen: BLOOD  Result Value Ref Range Status   Specimen Description BLOOD SITE NOT SPECIFIED  Final   Special Requests   Final    BOTTLES DRAWN AEROBIC AND ANAEROBIC Blood Culture adequate volume   Culture   Final    NO GROWTH 5 DAYS Performed at [facility], [place] [address], [place], [place] [address]    Report Status 01/30/2022 FINAL  Final     Radiology Studies: MR THORACIC SPINE WO CONTRAST  Result Date: 01/31/2022 CLINICAL DATA:  Mid back pain. Recurrent C difficile colitis and MSSA bacteremia. Concern for infection. History of rheumatoid arthritis. EXAM: MRI THORACIC AND LUMBAR SPINE WITHOUT CONTRAST TECHNIQUE: Multiplanar and multiecho pulse sequences of the thoracic and lumbar spine were obtained without intravenous contrast. COMPARISON:  CTs of the chest, abdomen and pelvis 01/24/2022. Lumbar MRI 11/23/2020. FINDINGS: MRI THORACIC SPINE FINDINGS Alignment: Mild thoracic scoliosis. There is straightening of the usual cervical lordosis on the localizing images with a minimal anterolisthesis at C3-4 and a minimal retrolisthesis at C6-7. Vertebrae: No acute or suspicious osseous findings. No evidence of discitis or osteomyelitis. Chronic endplate degenerative changes in the lower cervical spine and scattered throughout the thoracic spine,  most notable at T2-3 and T5-6. Cord:  The thoracic cord appears normal in signal and caliber. Paraspinal and other soft tissues: No paraspinal inflammatory changes are identified. There are small bilateral pleural effusions. Disc levels: The thoracic spinal canal is widely patent. There are mild degenerative changes throughout the thoracic  spine with small disc protrusions and endplate osteophytes. No large disc herniation, spinal stenosis or nerve root encroachment identified. Endplate osteophytes are most pronounced on the right at T11-12. In the cervical spine, there is greater spondylosis which contributes to partial effacement of the CSF surrounding the cord from C4-5 through C6-7. MRI LUMBAR SPINE FINDINGS Segmentation: There are 5 lumbar type vertebral bodies. Alignment: Convex right lumbar scoliosis. Stable degenerative grade 1 anterolisthesis at the L3-4, L4-5 and L5-S1 levels. Vertebrae: No evidence of acute fracture, pars defect or osteomyelitis. There is chronic degenerative disc disease at L3-4 with loss of disc height and endplate degenerative changes. Compared with the previous MRI, there is mildly increased T2 signal within the disc which is asymmetric to the left. This is favored to be degenerative, although early discitis is difficult to exclude, and there is a small amount of edema within the adjacent left psoas muscle. Mild sacroiliac degenerative changes bilaterally without evidence of acute infection. Conus medullaris: Extends to the L1-2 level and appears normal. Paraspinal and other soft tissues: As above, mild edema within the left psoas muscle, best seen on the sagittal inversion recovery images. No focal fluid collections are identified. The paraspinal soft tissues otherwise appear normal. There are small renal cysts bilaterally which do not require any specific imaging follow-up. Disc levels: As above, there are prominent anterior osteophytes in the lower thoracic spine. L1-2: Normal  interspace. L2-3: Stable mild disc bulging, endplate osteophytes and facet hypertrophy. No significant spinal stenosis or nerve root encroachment. L3-4: Chronic loss of disc height with annular disc bulging and endplate osteophytes. As above, new discal T2 hyperintensity which is asymmetric to the left. There is chronic multifactorial spinal stenosis which appears moderate. Left greater than right lateral recess narrowing has slightly improved from the previous study. Moderate left foraminal narrowing appears unchanged. L4-5: Severe multifactorial spinal stenosis secondary to advanced facet and ligamentous hypertrophy, the resulting grade 1 anterolisthesis, annular disc bulging and uncovering. Moderate foraminal narrowing bilaterally appears unchanged. L5-S1: Similar appearance of chronic degenerative disc disease with loss of disc height, annular disc bulging and endplate osteophytes. Mild bilateral facet hypertrophy. Unchanged asymmetric narrowing of the right lateral recess and mild foraminal narrowing bilaterally. IMPRESSION: 1. Compared with previous lumbar MRI of 11/23/2020, there is new discal T2 hyperintensity at L3-4 which is favored to be degenerative, although early discitis is difficult to exclude. There is a small amount of edema within the adjacent left psoas muscle. No other paraspinal inflammatory changes or focal fluid collections identified. No specific evidence of osteomyelitis. If spinal infection remains a clinical concern, suggest short-term follow-up of the lumbar spine without and with contrast. 2. No evidence of discitis or osteomyelitis in the thoracic spine. 3. Multilevel spondylosis, similar to previous MRI. There is chronic multifactorial spinal stenosis at L3-4 (moderate) and L4-5 (severe). Additional details above. Electronically Signed   By: Richardean Sale M.D.   On: 01/31/2022 13:22   MR LUMBAR SPINE WO CONTRAST  Result Date: 01/31/2022 CLINICAL DATA:  Mid back pain. Recurrent  C difficile colitis and MSSA bacteremia. Concern for infection. History of rheumatoid arthritis. EXAM: MRI THORACIC AND LUMBAR SPINE WITHOUT CONTRAST TECHNIQUE: Multiplanar and multiecho pulse sequences of the thoracic and lumbar spine were obtained without intravenous contrast. COMPARISON:  CTs of the chest, abdomen and pelvis 01/24/2022. Lumbar MRI 11/23/2020. FINDINGS: MRI THORACIC SPINE FINDINGS Alignment: Mild thoracic scoliosis. There is straightening of the usual cervical lordosis on the localizing images with a minimal anterolisthesis at C3-4 and a  minimal retrolisthesis at C6-7. Vertebrae: No acute or suspicious osseous findings. No evidence of discitis or osteomyelitis. Chronic endplate degenerative changes in the lower cervical spine and scattered throughout the thoracic spine, most notable at T2-3 and T5-6. Cord:  The thoracic cord appears normal in signal and caliber. Paraspinal and other soft tissues: No paraspinal inflammatory changes are identified. There are small bilateral pleural effusions. Disc levels: The thoracic spinal canal is widely patent. There are mild degenerative changes throughout the thoracic spine with small disc protrusions and endplate osteophytes. No large disc herniation, spinal stenosis or nerve root encroachment identified. Endplate osteophytes are most pronounced on the right at T11-12. In the cervical spine, there is greater spondylosis which contributes to partial effacement of the CSF surrounding the cord from C4-5 through C6-7. MRI LUMBAR SPINE FINDINGS Segmentation: There are 5 lumbar type vertebral bodies. Alignment: Convex right lumbar scoliosis. Stable degenerative grade 1 anterolisthesis at the L3-4, L4-5 and L5-S1 levels. Vertebrae: No evidence of acute fracture, pars defect or osteomyelitis. There is chronic degenerative disc disease at L3-4 with loss of disc height and endplate degenerative changes. Compared with the previous MRI, there is mildly increased T2  signal within the disc which is asymmetric to the left. This is favored to be degenerative, although early discitis is difficult to exclude, and there is a small amount of edema within the adjacent left psoas muscle. Mild sacroiliac degenerative changes bilaterally without evidence of acute infection. Conus medullaris: Extends to the L1-2 level and appears normal. Paraspinal and other soft tissues: As above, mild edema within the left psoas muscle, best seen on the sagittal inversion recovery images. No focal fluid collections are identified. The paraspinal soft tissues otherwise appear normal. There are small renal cysts bilaterally which do not require any specific imaging follow-up. Disc levels: As above, there are prominent anterior osteophytes in the lower thoracic spine. L1-2: Normal interspace. L2-3: Stable mild disc bulging, endplate osteophytes and facet hypertrophy. No significant spinal stenosis or nerve root encroachment. L3-4: Chronic loss of disc height with annular disc bulging and endplate osteophytes. As above, new discal T2 hyperintensity which is asymmetric to the left. There is chronic multifactorial spinal stenosis which appears moderate. Left greater than right lateral recess narrowing has slightly improved from the previous study. Moderate left foraminal narrowing appears unchanged. L4-5: Severe multifactorial spinal stenosis secondary to advanced facet and ligamentous hypertrophy, the resulting grade 1 anterolisthesis, annular disc bulging and uncovering. Moderate foraminal narrowing bilaterally appears unchanged. L5-S1: Similar appearance of chronic degenerative disc disease with loss of disc height, annular disc bulging and endplate osteophytes. Mild bilateral facet hypertrophy. Unchanged asymmetric narrowing of the right lateral recess and mild foraminal narrowing bilaterally. IMPRESSION: 1. Compared with previous lumbar MRI of 11/23/2020, there is new discal T2 hyperintensity at L3-4  which is favored to be degenerative, although early discitis is difficult to exclude. There is a small amount of edema within the adjacent left psoas muscle. No other paraspinal inflammatory changes or focal fluid collections identified. No specific evidence of osteomyelitis. If spinal infection remains a clinical concern, suggest short-term follow-up of the lumbar spine without and with contrast. 2. No evidence of discitis or osteomyelitis in the thoracic spine. 3. Multilevel spondylosis, similar to previous MRI. There is chronic multifactorial spinal stenosis at L3-4 (moderate) and L4-5 (severe). Additional details above. Electronically Signed   By: Richardean Sale M.D.   On: 01/31/2022 13:22     Marzetta Board, MD, PhD [facility]  Between 7 am -  7 pm I am available, please contact me via Amion (for emergencies) or Securechat (non urgent messages)  Between 7 pm - 7 am I am not available, please contact night coverage MD/APP via Amion

## 2022-02-01 NOTE — Progress Notes (Signed)
Physical Therapy Treatment Patient Details Name: Shelby Walker MRN: [id number] DOB: 1933/11/04 Today's Date: 02/01/2022   History of Present Illness 88 year old female with chest pains took nitroglycerin prior to going to sleep and difficult to rouse the next morning, brought to ED 11/22 and found to have fever of 103, admitted to ICU for septic shock due to staph bacteria. S/p pacemaker removal 11/27. PMH: CAD, diastolic function, G6YI, HLD, P A-fib, PAD, CKD, RA and pacemaker, s/p transmetatarsal amputation 11/22    PT Comments    Pt reports feeling weak, but motivated to progress mobility with PT assist. Pt tolerating x4 sit<>stands throughout session and short-distance gait in room. Pt with diarrhea during session, PT assisting with clean up on BSC. Overall pt requires light physical assist for mobility, and continues to demonstrate deficits in balance, strength, and mobility. PT continuing to recommend AIR post-acutely.     Recommendations for follow up therapy are one component of a multi-disciplinary discharge planning process, led by the attending physician.  Recommendations may be updated based on patient status, additional functional criteria and insurance authorization.  Follow Up Recommendations  Acute inpatient rehab (3hours/day) Can patient physically be transported by private vehicle: Yes   Assistance Recommended at Discharge Frequent or constant Supervision/Assistance  Patient can return home with the following A little help with walking and/or transfers;A little help with bathing/dressing/bathroom;Assistance with cooking/housework;Direct supervision/assist for medications management;Direct supervision/assist for financial management;Assist for transportation;Help with stairs or ramp for entrance   Equipment Recommendations  None recommended by PT    Recommendations for Other Services       Precautions / Restrictions Precautions Precautions: Fall Restrictions Weight  Bearing Restrictions: No     Mobility  Bed Mobility Overal bed mobility: Needs Assistance             General bed mobility comments: EOB with daughter    Transfers Overall transfer level: Needs assistance Equipment used: Rolling walker (2 wheels) Transfers: Sit to/from Stand Sit to Stand: Min assist   Step pivot transfers: Min assist       General transfer comment: min assist for power up, rise, steadying, and pivot to/from [facility] x2. Cues for proper hand placement when rising/sitting, sequencing transfer task.    Ambulation/Gait Ambulation/Gait assistance: Min assist Gait Distance (Feet): 12 Feet Assistive device: Rolling walker (2 wheels) Gait Pattern/deviations: Step-through pattern, Decreased stride length, Trunk flexed, Decreased dorsiflexion - left, Decreased dorsiflexion - right Gait velocity: decr     General Gait Details: cues for upright posture, increasing foot clearance. light assist to steady and guide RW around the room.   Stairs             Wheelchair Mobility    Modified Rankin (Stroke Patients Only)       Balance Overall balance assessment: Needs assistance Sitting-balance support: No upper extremity supported, Feet supported Sitting balance-Leahy Scale: Fair     Standing balance support: Bilateral upper extremity supported, During functional activity, Reliant on assistive device for balance Standing balance-Leahy Scale: Poor Standing balance comment: Reliant on RW and up to [employer] Arousal/Alertness: Awake/alert Behavior During Therapy: Flat affect Overall Cognitive Status: Impaired/Different from baseline Area of Impairment: Orientation, Memory, Following commands, Problem solving                 Orientation Level: Disoriented to, Situation  Memory: Decreased short-term memory Following Commands: Follows one step commands with increased time     Problem Solving: Requires  verbal cues, Requires tactile cues, Slow processing General Comments: Pt shocked to learn she has shingles, stating "I do?!" when PT mentioned it. Pt states she is not remembering things, pt's daughter present and has to remind her of previous hospitalizations, events during hospitalizations, etc.        Exercises General Exercises - Lower Extremity Long Arc Quad: AROM, Both, 10 reps, Seated    General Comments General comments (skin integrity, edema, etc.): loose BM during session, pt with complaints of dizziness at end of session but not during mobility      Pertinent Vitals/Pain Pain Assessment Pain Assessment: Faces Faces Pain Scale: Hurts little more Pain Location: back Pain Descriptors / Indicators: Discomfort, Grimacing Pain Intervention(s): Limited activity within patient's tolerance, Monitored during session, Repositioned    Home Living                          Prior Function            PT Goals (current goals can now be found in the care plan section) Acute Rehab PT Goals Patient Stated Goal: to feel better PT Goal Formulation: With patient Time For Goal Achievement: 02/13/22 Potential to Achieve Goals: Good Progress towards PT goals: Progressing toward goals    Frequency    Min 3X/week      PT Plan Current plan remains appropriate    Co-evaluation              AM-PAC PT "6 Clicks" Mobility   Outcome Measure  Help needed turning from your back to your side while in a flat bed without using bedrails?: A Little Help needed moving from lying on your back to sitting on the side of a flat bed without using bedrails?: A Little Help needed moving to and from a bed to a chair (including a wheelchair)?: A Little Help needed standing up from a chair using your arms (e.g., wheelchair or bedside chair)?: A Little Help needed to walk in hospital room?: A Lot Help needed climbing 3-5 steps with a railing? : A Lot 6 Click Score: 16    End of  Session   Activity Tolerance: Patient tolerated treatment well Patient left: in chair;with call bell/phone within reach;with family/visitor present;Other (comment) (pt's daughter states she will stay until pt gets back to bed with staff) Nurse Communication: Mobility status PT Visit Diagnosis: Unsteadiness on feet (R26.81);Other abnormalities of gait and mobility (R26.89);Difficulty in walking, not elsewhere classified (R26.2)     Time: 3419-3790 PT Time Calculation (min) (ACUTE ONLY): 28 min  Charges:  $Therapeutic Activity: 23-37 mins                     Stacie Glaze, PT DPT Acute Rehabilitation Services Pager [phone]  Office [phone]   Paola E Ruffin Pyo 02/01/2022, 2:02 PM

## 2022-02-02 ENCOUNTER — Inpatient Hospital Stay (HOSPITAL_COMMUNITY)
Admission: RE | Admit: 2022-02-02 | Discharge: 2022-02-15 | DRG: 945 | Disposition: A | Discharge status: Home Health | Payer: Medicare Other | Source: Other Acute Inpatient Hospital | Attending: Physical Medicine & Rehabilitation | Admitting: Physical Medicine & Rehabilitation

## 2022-02-02 ENCOUNTER — Encounter (HOSPITAL_COMMUNITY): Payer: Self-pay

## 2022-02-02 VITALS — BP 146/76 | HR 71 | Temp 98.3°F | Resp 16 | Wt 115.7 lb

## 2022-02-02 DIAGNOSIS — D631 Anemia in chronic kidney disease: Secondary | ICD-10-CM | POA: Diagnosis present

## 2022-02-02 DIAGNOSIS — Z823 Family history of stroke: Secondary | ICD-10-CM | POA: Diagnosis not present

## 2022-02-02 DIAGNOSIS — Z950 Presence of cardiac pacemaker: Secondary | ICD-10-CM | POA: Diagnosis not present

## 2022-02-02 DIAGNOSIS — Z79899 Other long term (current) drug therapy: Secondary | ICD-10-CM | POA: Diagnosis not present

## 2022-02-02 DIAGNOSIS — R6521 Severe sepsis with septic shock: Secondary | ICD-10-CM | POA: Diagnosis not present

## 2022-02-02 DIAGNOSIS — Z66 Do not resuscitate: Secondary | ICD-10-CM | POA: Diagnosis present

## 2022-02-02 DIAGNOSIS — K5903 Drug induced constipation: Secondary | ICD-10-CM | POA: Diagnosis not present

## 2022-02-02 DIAGNOSIS — M4646 Discitis, unspecified, lumbar region: Secondary | ICD-10-CM

## 2022-02-02 DIAGNOSIS — I5189 Other ill-defined heart diseases: Secondary | ICD-10-CM | POA: Diagnosis not present

## 2022-02-02 DIAGNOSIS — Z8249 Family history of ischemic heart disease and other diseases of the circulatory system: Secondary | ICD-10-CM | POA: Diagnosis not present

## 2022-02-02 DIAGNOSIS — E1122 Type 2 diabetes mellitus with diabetic chronic kidney disease: Secondary | ICD-10-CM | POA: Diagnosis present

## 2022-02-02 DIAGNOSIS — A419 Sepsis, unspecified organism: Secondary | ICD-10-CM | POA: Diagnosis not present

## 2022-02-02 DIAGNOSIS — E1169 Type 2 diabetes mellitus with other specified complication: Secondary | ICD-10-CM | POA: Diagnosis present

## 2022-02-02 DIAGNOSIS — K219 Gastro-esophageal reflux disease without esophagitis: Secondary | ICD-10-CM | POA: Diagnosis present

## 2022-02-02 DIAGNOSIS — I129 Hypertensive chronic kidney disease with stage 1 through stage 4 chronic kidney disease, or unspecified chronic kidney disease: Secondary | ICD-10-CM | POA: Diagnosis present

## 2022-02-02 DIAGNOSIS — I340 Nonrheumatic mitral (valve) insufficiency: Secondary | ICD-10-CM | POA: Diagnosis present

## 2022-02-02 DIAGNOSIS — M810 Age-related osteoporosis without current pathological fracture: Secondary | ICD-10-CM | POA: Diagnosis present

## 2022-02-02 DIAGNOSIS — I480 Paroxysmal atrial fibrillation: Secondary | ICD-10-CM | POA: Diagnosis present

## 2022-02-02 DIAGNOSIS — B029 Zoster without complications: Secondary | ICD-10-CM

## 2022-02-02 DIAGNOSIS — R001 Bradycardia, unspecified: Secondary | ICD-10-CM | POA: Diagnosis not present

## 2022-02-02 DIAGNOSIS — E440 Moderate protein-calorie malnutrition: Secondary | ICD-10-CM | POA: Diagnosis not present

## 2022-02-02 DIAGNOSIS — A0471 Enterocolitis due to Clostridium difficile, recurrent: Secondary | ICD-10-CM | POA: Diagnosis present

## 2022-02-02 DIAGNOSIS — A0472 Enterocolitis due to Clostridium difficile, not specified as recurrent: Secondary | ICD-10-CM | POA: Diagnosis not present

## 2022-02-02 DIAGNOSIS — K5900 Constipation, unspecified: Secondary | ICD-10-CM | POA: Diagnosis not present

## 2022-02-02 DIAGNOSIS — B9561 Methicillin susceptible Staphylococcus aureus infection as the cause of diseases classified elsewhere: Secondary | ICD-10-CM | POA: Diagnosis not present

## 2022-02-02 DIAGNOSIS — R6520 Severe sepsis without septic shock: Secondary | ICD-10-CM | POA: Diagnosis not present

## 2022-02-02 DIAGNOSIS — Z7952 Long term (current) use of systemic steroids: Secondary | ICD-10-CM

## 2022-02-02 DIAGNOSIS — Z833 Family history of diabetes mellitus: Secondary | ICD-10-CM

## 2022-02-02 DIAGNOSIS — E119 Type 2 diabetes mellitus without complications: Secondary | ICD-10-CM | POA: Diagnosis not present

## 2022-02-02 DIAGNOSIS — E785 Hyperlipidemia, unspecified: Secondary | ICD-10-CM | POA: Diagnosis present

## 2022-02-02 DIAGNOSIS — I495 Sick sinus syndrome: Secondary | ICD-10-CM | POA: Diagnosis present

## 2022-02-02 DIAGNOSIS — A4189 Other specified sepsis: Secondary | ICD-10-CM | POA: Diagnosis present

## 2022-02-02 DIAGNOSIS — I959 Hypotension, unspecified: Secondary | ICD-10-CM | POA: Diagnosis not present

## 2022-02-02 DIAGNOSIS — M4626 Osteomyelitis of vertebra, lumbar region: Secondary | ICD-10-CM | POA: Diagnosis present

## 2022-02-02 DIAGNOSIS — R5381 Other malaise: Principal | ICD-10-CM | POA: Diagnosis present

## 2022-02-02 DIAGNOSIS — Z888 Allergy status to other drugs, medicaments and biological substances status: Secondary | ICD-10-CM

## 2022-02-02 DIAGNOSIS — I5032 Chronic diastolic (congestive) heart failure: Secondary | ICD-10-CM | POA: Diagnosis not present

## 2022-02-02 DIAGNOSIS — N189 Chronic kidney disease, unspecified: Secondary | ICD-10-CM | POA: Diagnosis not present

## 2022-02-02 DIAGNOSIS — Z8616 Personal history of COVID-19: Secondary | ICD-10-CM

## 2022-02-02 DIAGNOSIS — M069 Rheumatoid arthritis, unspecified: Secondary | ICD-10-CM | POA: Diagnosis present

## 2022-02-02 DIAGNOSIS — N1832 Chronic kidney disease, stage 3b: Secondary | ICD-10-CM | POA: Diagnosis present

## 2022-02-02 DIAGNOSIS — Z882 Allergy status to sulfonamides status: Secondary | ICD-10-CM

## 2022-02-02 DIAGNOSIS — Z8619 Personal history of other infectious and parasitic diseases: Secondary | ICD-10-CM

## 2022-02-02 DIAGNOSIS — T827XXA Infection and inflammatory reaction due to other cardiac and vascular devices, implants and grafts, initial encounter: Secondary | ICD-10-CM

## 2022-02-02 DIAGNOSIS — T827XXD Infection and inflammatory reaction due to other cardiac and vascular devices, implants and grafts, subsequent encounter: Secondary | ICD-10-CM | POA: Diagnosis not present

## 2022-02-02 DIAGNOSIS — T827XXS Infection and inflammatory reaction due to other cardiac and vascular devices, implants and grafts, sequela: Secondary | ICD-10-CM | POA: Diagnosis not present

## 2022-02-02 DIAGNOSIS — T3695XA Adverse effect of unspecified systemic antibiotic, initial encounter: Secondary | ICD-10-CM | POA: Diagnosis present

## 2022-02-02 DIAGNOSIS — Z794 Long term (current) use of insulin: Secondary | ICD-10-CM | POA: Diagnosis not present

## 2022-02-02 DIAGNOSIS — I10 Essential (primary) hypertension: Secondary | ICD-10-CM | POA: Diagnosis not present

## 2022-02-02 DIAGNOSIS — R110 Nausea: Secondary | ICD-10-CM | POA: Diagnosis present

## 2022-02-02 DIAGNOSIS — N179 Acute kidney failure, unspecified: Secondary | ICD-10-CM | POA: Diagnosis not present

## 2022-02-02 LAB — CBC
HCT: 31.6 % — ABNORMAL LOW (ref 36.0–46.0)
Hemoglobin: 10.4 g/dL — ABNORMAL LOW (ref 12.0–15.0)
MCH: 29.9 pg (ref 26.0–34.0)
MCHC: 32.9 g/dL (ref 30.0–36.0)
MCV: 90.8 fL (ref 80.0–100.0)
Platelets: 254 10*3/uL (ref 150–400)
RBC: 3.48 MIL/uL — ABNORMAL LOW (ref 3.87–5.11)
RDW: 15.5 % (ref 11.5–15.5)
WBC: 12.1 10*3/uL — ABNORMAL HIGH (ref 4.0–10.5)
nRBC: 0 % (ref 0.0–0.2)

## 2022-02-02 LAB — COMPREHENSIVE METABOLIC PANEL
ALT: 6 U/L (ref 0–44)
AST: 22 U/L (ref 15–41)
Albumin: 2.6 g/dL — ABNORMAL LOW (ref 3.5–5.0)
Alkaline Phosphatase: 57 U/L (ref 38–126)
Anion gap: 11 (ref 5–15)
BUN: 21 mg/dL (ref 8–23)
CO2: 27 mmol/L (ref 22–32)
Calcium: 8 mg/dL — ABNORMAL LOW (ref 8.9–10.3)
Chloride: 100 mmol/L (ref 98–111)
Creatinine, Ser: 1.34 mg/dL — ABNORMAL HIGH (ref 0.44–1.00)
GFR, Estimated: 39 mL/min — ABNORMAL LOW (ref >60)
Glucose, Bld: 106 mg/dL — ABNORMAL HIGH (ref 70–99)
Potassium: 4.4 mmol/L (ref 3.5–5.1)
Sodium: 138 mmol/L (ref 135–145)
Total Bilirubin: 0.5 mg/dL (ref 0.3–1.2)
Total Protein: 5.9 g/dL — ABNORMAL LOW (ref 6.5–8.1)

## 2022-02-02 LAB — GLUCOSE, CAPILLARY
Glucose-Capillary: 148 mg/dL — ABNORMAL HIGH (ref 70–99)
Glucose-Capillary: 185 mg/dL — ABNORMAL HIGH (ref 70–99)
Glucose-Capillary: 212 mg/dL — ABNORMAL HIGH (ref 70–99)
Glucose-Capillary: 77 mg/dL (ref 70–99)
Glucose-Capillary: 95 mg/dL (ref 70–99)

## 2022-02-02 LAB — MAGNESIUM: Magnesium: 1.7 mg/dL (ref 1.7–2.4)

## 2022-02-02 MED ORDER — FUROSEMIDE 20 MG PO TABS: 20.0000 mg | ORAL_TABLET | Freq: Every day | ORAL | Status: DC

## 2022-02-02 MED ORDER — FUROSEMIDE 20 MG PO TABS
20.0000 mg | ORAL_TABLET | Freq: Every day | ORAL | Status: DC
  Administered 2022-02-03 – 2022-02-04 (×2): 20 mg via ORAL
  Filled 2022-02-02 (×2): qty 1

## 2022-02-02 MED ORDER — AMLODIPINE BESYLATE 10 MG PO TABS
10.0000 mg | ORAL_TABLET | Freq: Every day | ORAL | Status: DC
  Administered 2022-02-03 – 2022-02-04 (×2): 10 mg via ORAL
  Filled 2022-02-02 (×2): qty 1

## 2022-02-02 MED ORDER — DOCUSATE SODIUM 100 MG PO CAPS
100.0000 mg | ORAL_CAPSULE | Freq: Two times a day (BID) | ORAL | Status: DC | PRN
  Administered 2022-02-04 (×2): 100 mg via ORAL
  Filled 2022-02-02 (×2): qty 1

## 2022-02-02 MED ORDER — SODIUM CHLORIDE 0.9% FLUSH
10.0000 mL | Freq: Two times a day (BID) | INTRAVENOUS | Status: DC
  Administered 2022-02-02 – 2022-02-15 (×23): 10 mL

## 2022-02-02 MED ORDER — PANTOPRAZOLE SODIUM 40 MG PO TBEC
40.0000 mg | DELAYED_RELEASE_TABLET | Freq: Every day | ORAL | Status: DC
  Administered 2022-02-03 – 2022-02-15 (×13): 40 mg via ORAL
  Filled 2022-02-02 (×13): qty 1

## 2022-02-02 MED ORDER — GUAIFENESIN-DM 100-10 MG/5ML PO SYRP: 5.0000 mL | ORAL_SOLUTION | Freq: Four times a day (QID) | ORAL | Status: DC | PRN

## 2022-02-02 MED ORDER — SODIUM CHLORIDE 0.9% FLUSH
3.0000 mL | Freq: Two times a day (BID) | INTRAVENOUS | Status: DC
  Administered 2022-02-02 – 2022-02-15 (×23): 3 mL via INTRAVENOUS

## 2022-02-02 MED ORDER — POLYETHYLENE GLYCOL 3350 17 G PO PACK: 17.0000 g | PACK | Freq: Every day | ORAL | Status: DC | PRN

## 2022-02-02 MED ORDER — SODIUM CHLORIDE 0.9% FLUSH
10.0000 mL | INTRAVENOUS | Status: DC | PRN
  Administered 2022-02-03 – 2022-02-12 (×4): 10 mL

## 2022-02-02 MED ORDER — SACCHAROMYCES BOULARDII 250 MG PO CAPS
250.0000 mg | ORAL_CAPSULE | Freq: Two times a day (BID) | ORAL | Status: DC
  Administered 2022-02-02 – 2022-02-15 (×26): 250 mg via ORAL
  Filled 2022-02-02 (×26): qty 1

## 2022-02-02 MED ORDER — CHLORHEXIDINE GLUCONATE CLOTH 2 % EX PADS
6.0000 | MEDICATED_PAD | Freq: Every day | CUTANEOUS | Status: DC
  Administered 2022-02-05 – 2022-02-06 (×2): 6 via TOPICAL

## 2022-02-02 MED ORDER — ACETAMINOPHEN 325 MG PO TABS
360.0000 mg | ORAL_TABLET | Freq: Four times a day (QID) | ORAL | Status: DC
  Administered 2022-02-02 – 2022-02-15 (×48): 325 mg via ORAL
  Filled 2022-02-02 (×43): qty 1

## 2022-02-02 MED ORDER — ENOXAPARIN SODIUM 30 MG/0.3ML IJ SOSY
30.0000 mg | PREFILLED_SYRINGE | INTRAMUSCULAR | Status: DC
  Administered 2022-02-02 – 2022-02-14 (×13): 30 mg via SUBCUTANEOUS
  Filled 2022-02-02 (×13): qty 0.3

## 2022-02-02 MED ORDER — DIPHENHYDRAMINE HCL 12.5 MG/5ML PO ELIX
12.5000 mg | ORAL_SOLUTION | Freq: Four times a day (QID) | ORAL | Status: DC | PRN
  Administered 2022-02-13: 12.5 mg via ORAL
  Filled 2022-02-02: qty 0

## 2022-02-02 MED ORDER — AMIODARONE HCL 200 MG PO TABS: 200.0000 mg | ORAL_TABLET | Freq: Two times a day (BID) | ORAL | Status: DC

## 2022-02-02 MED ORDER — LIDOCAINE 5 % EX PTCH
1.0000 | MEDICATED_PATCH | CUTANEOUS | Status: DC
  Administered 2022-02-04 – 2022-02-15 (×11): 1 via TRANSDERMAL
  Filled 2022-02-02 (×10): qty 1

## 2022-02-02 MED ORDER — CEFAZOLIN SODIUM-DEXTROSE 2-4 GM/100ML-% IV SOLN
2.0000 g | Freq: Two times a day (BID) | INTRAVENOUS | Status: DC
  Administered 2022-02-02 – 2022-02-10 (×16): 2 g via INTRAVENOUS
  Filled 2022-02-02 (×16): qty 100

## 2022-02-02 MED ORDER — ALUM & MAG HYDROXIDE-SIMETH 200-200-20 MG/5ML PO SUSP: 30.0000 mL | ORAL | Status: DC | PRN

## 2022-02-02 MED ORDER — CEFAZOLIN SODIUM-DEXTROSE 2-4 GM/100ML-% IV SOLN: 2.0000 g | Freq: Two times a day (BID) | INTRAVENOUS | Status: DC

## 2022-02-02 MED ORDER — PREDNISONE 5 MG PO TABS
5.0000 mg | ORAL_TABLET | Freq: Every day | ORAL | Status: DC
  Administered 2022-02-03 – 2022-02-15 (×13): 5 mg via ORAL
  Filled 2022-02-02 (×13): qty 1

## 2022-02-02 MED ORDER — AMIODARONE HCL 200 MG PO TABS
200.0000 mg | ORAL_TABLET | Freq: Two times a day (BID) | ORAL | Status: AC
  Administered 2022-02-02 – 2022-02-03 (×3): 200 mg via ORAL
  Filled 2022-02-02 (×3): qty 1

## 2022-02-02 MED ORDER — MAGIC MOUTHWASH
10.0000 mL | Freq: Four times a day (QID) | ORAL | Status: DC
  Administered 2022-02-02 – 2022-02-15 (×35): 10 mL via ORAL
  Filled 2022-02-02 (×53): qty 10

## 2022-02-02 MED ORDER — METHOCARBAMOL 500 MG PO TABS
500.0000 mg | ORAL_TABLET | Freq: Four times a day (QID) | ORAL | Status: DC | PRN
  Administered 2022-02-10 – 2022-02-13 (×4): 500 mg via ORAL
  Filled 2022-02-02 (×4): qty 1

## 2022-02-02 MED ORDER — ONDANSETRON HCL 4 MG/2ML IJ SOLN
4.0000 mg | Freq: Three times a day (TID) | INTRAMUSCULAR | Status: DC
  Administered 2022-02-02 (×2): 4 mg via INTRAVENOUS
  Filled 2022-02-02: qty 2

## 2022-02-02 MED ORDER — TRAZODONE HCL 50 MG PO TABS
25.0000 mg | ORAL_TABLET | Freq: Every evening | ORAL | Status: DC | PRN
  Administered 2022-02-02 – 2022-02-13 (×6): 50 mg via ORAL
  Filled 2022-02-02 (×6): qty 1

## 2022-02-02 MED ORDER — ENSURE ENLIVE PO LIQD
237.0000 mL | Freq: Three times a day (TID) | ORAL | Status: DC
  Administered 2022-02-02: 237 mL via ORAL

## 2022-02-02 MED ORDER — INSULIN ASPART 100 UNIT/ML IJ SOLN
0.0000 [IU] | Freq: Three times a day (TID) | INTRAMUSCULAR | Status: DC
  Administered 2022-02-02 – 2022-02-03 (×2): 2 [IU] via SUBCUTANEOUS
  Administered 2022-02-03: 5 [IU] via SUBCUTANEOUS
  Administered 2022-02-03: 3 [IU] via SUBCUTANEOUS
  Administered 2022-02-03: 1 [IU] via SUBCUTANEOUS
  Administered 2022-02-04: 7 [IU] via SUBCUTANEOUS
  Administered 2022-02-04: 3 [IU] via SUBCUTANEOUS
  Administered 2022-02-04: 9 [IU] via SUBCUTANEOUS
  Administered 2022-02-05: 3 [IU] via SUBCUTANEOUS
  Administered 2022-02-05: 2 [IU] via SUBCUTANEOUS
  Administered 2022-02-05: 5 [IU] via SUBCUTANEOUS
  Administered 2022-02-05: 3 [IU] via SUBCUTANEOUS
  Administered 2022-02-06: 5 [IU] via SUBCUTANEOUS
  Administered 2022-02-06: 1 [IU] via SUBCUTANEOUS
  Administered 2022-02-06: 3 [IU] via SUBCUTANEOUS
  Administered 2022-02-07 (×2): 2 [IU] via SUBCUTANEOUS
  Administered 2022-02-07 – 2022-02-08 (×2): 5 [IU] via SUBCUTANEOUS
  Administered 2022-02-08: 2 [IU] via SUBCUTANEOUS
  Administered 2022-02-08: 1 [IU] via SUBCUTANEOUS
  Administered 2022-02-08: 7 [IU] via SUBCUTANEOUS
  Administered 2022-02-09: 2 [IU] via SUBCUTANEOUS
  Administered 2022-02-09 – 2022-02-10 (×2): 5 [IU] via SUBCUTANEOUS
  Administered 2022-02-10: 3 [IU] via SUBCUTANEOUS
  Administered 2022-02-10 – 2022-02-11 (×3): 2 [IU] via SUBCUTANEOUS
  Administered 2022-02-11 (×2): 3 [IU] via SUBCUTANEOUS
  Administered 2022-02-12: 5 [IU] via SUBCUTANEOUS
  Administered 2022-02-12: 1 [IU] via SUBCUTANEOUS
  Administered 2022-02-12: 2 [IU] via SUBCUTANEOUS
  Administered 2022-02-13 (×2): 1 [IU] via SUBCUTANEOUS
  Administered 2022-02-13: 7 [IU] via SUBCUTANEOUS
  Administered 2022-02-14: 1 [IU] via SUBCUTANEOUS
  Administered 2022-02-14: 3 [IU] via SUBCUTANEOUS
  Administered 2022-02-14: 2 [IU] via SUBCUTANEOUS
  Administered 2022-02-15: 1 [IU] via SUBCUTANEOUS

## 2022-02-02 MED ORDER — AMLODIPINE BESYLATE 10 MG PO TABS: 10.0000 mg | ORAL_TABLET | Freq: Every day | ORAL | Status: DC

## 2022-02-02 MED ORDER — VALACYCLOVIR HCL 1 G PO TABS: 1000.0000 mg | ORAL_TABLET | Freq: Every day | ORAL | Status: DC

## 2022-02-02 MED ORDER — INSULIN ASPART 100 UNIT/ML IJ SOLN: 0.0000 [IU] | INTRAMUSCULAR | Status: DC

## 2022-02-02 MED ORDER — PANTOPRAZOLE SODIUM 40 MG PO TBEC: 40.0000 mg | DELAYED_RELEASE_TABLET | Freq: Every day | ORAL | Status: DC

## 2022-02-02 MED ORDER — ENSURE ENLIVE PO LIQD
237.0000 mL | Freq: Two times a day (BID) | ORAL | Status: DC
  Administered 2022-02-03 – 2022-02-15 (×23): 237 mL via ORAL

## 2022-02-02 MED ORDER — RENA-VITE PO TABS
1.0000 | ORAL_TABLET | Freq: Every day | ORAL | Status: DC
  Administered 2022-02-02 – 2022-02-14 (×13): 1 via ORAL
  Filled 2022-02-02 (×12): qty 1

## 2022-02-02 MED ORDER — VANCOMYCIN HCL 125 MG PO CAPS: 125.0000 mg | ORAL_CAPSULE | Freq: Two times a day (BID) | ORAL | Status: DC

## 2022-02-02 MED ORDER — AMIODARONE HCL 200 MG PO TABS
200.0000 mg | ORAL_TABLET | Freq: Every day | ORAL | Status: DC
  Administered 2022-02-04 – 2022-02-15 (×12): 200 mg via ORAL
  Filled 2022-02-02 (×12): qty 1

## 2022-02-02 MED ORDER — VANCOMYCIN HCL 125 MG PO CAPS
125.0000 mg | ORAL_CAPSULE | Freq: Two times a day (BID) | ORAL | Status: DC
  Administered 2022-02-02 – 2022-02-15 (×26): 125 mg via ORAL
  Filled 2022-02-02 (×27): qty 1

## 2022-02-02 MED ORDER — ONDANSETRON HCL 4 MG/2ML IJ SOLN
4.0000 mg | Freq: Two times a day (BID) | INTRAMUSCULAR | Status: DC
  Administered 2022-02-02 – 2022-02-15 (×26): 4 mg via INTRAVENOUS
  Filled 2022-02-02 (×26): qty 2

## 2022-02-02 MED ORDER — FLEET ENEMA 7-19 GM/118ML RE ENEM: 1.0000 | ENEMA | Freq: Once | RECTAL | Status: DC | PRN

## 2022-02-02 MED ORDER — VALACYCLOVIR HCL 500 MG PO TABS
1000.0000 mg | ORAL_TABLET | Freq: Every day | ORAL | Status: AC
  Administered 2022-02-03 – 2022-02-08 (×6): 1000 mg via ORAL
  Filled 2022-02-02 (×6): qty 2

## 2022-02-02 MED ORDER — PREDNISONE 5 MG PO TABS: 5.0000 mg | ORAL_TABLET | Freq: Every day | ORAL | Status: DC

## 2022-02-02 MED ORDER — LIDOCAINE 5 % EX PTCH: 1.0000 | MEDICATED_PATCH | CUTANEOUS | 0 refills | Status: DC

## 2022-02-02 MED ORDER — SORBITOL 70 % SOLN: 30.0000 mL | Freq: Every day | Status: DC | PRN

## 2022-02-02 MED ORDER — AMIODARONE HCL 200 MG PO TABS: ORAL_TABLET | ORAL | 0 refills | Status: DC

## 2022-02-02 NOTE — Progress Notes (Signed)
Inpatient Rehabilitation Admissions Coordinator   Discussed with Dr Renne Crigler and daughter, by phone. We can admit to CIR today. I will alert acute team and TOC to make the arrangements.  Danne Baxter, RN, MSN Rehab Admissions Coordinator [phone] 02/02/2022 11:45 AM

## 2022-02-02 NOTE — Progress Notes (Signed)
Izora Ribas, MD  Physician Physical Medicine and Rehabilitation   PMR Pre-admission    Signed   Date of Service: 02/02/2022 12:28 PM  Related encounter: ED to Hosp-Admission (Current) from 01/24/2022 in [facility] 4E CV SURGICAL PROGRESSIVE CARE   Signed      Show:Clear all _0 Written_1 Templated_2 Copied  Added by: _3 Cristina Gong, RN_4 Ranell Patrick Clide Deutscher, MD  _5 Hover for details PMR Admission Coordinator Pre-Admission Assessment   Patient: Shelby Walker is an 88 y.o., female MRN: [id number] DOB: 1933/05/23 Height: _6  (154.9 cm) Weight: 52.9 kg   Insurance Information HMO:     PPO:      PCP:      IPA:      80/20:      OTHER:  PRIMARY: Medicare a and b      Policy#: [id number]      Subscriber: pt Benefits:  Phone #: passport one source     Name: 11/30 Eff. Date: 06/04/1999     Deduct: $1600      Out of Pocket Max: none      Life Max: none CIR: 100%      SNF: 20 full days Outpatient: 80%     Co-Pay: 20% Home Health: 100%      Co-Pay: none DME: 80%     Co-Pay: 20% Providers: pt choice  SECONDARY: [employer]      Policy#: [id number]   Financial Counselor:       Phone#:    The "Data Collection Information Summary" for patients in Inpatient Rehabilitation Facilities with attached "Privacy Act [place] Records" was provided and verbally reviewed with: Patient and Family   Emergency Contact Information Contact Information       Name Relation Home Work Mobile    [place] Daughter     [phone]    Kyle, Stansell     [phone]         Current Medical History  Patient Admitting Diagnosis: Debility   History of Present Illness: 88 year old female with history of cdiff, DM, diverticulosis, HLD, GERD, osteomyelitis of lumbar spine, Mitral regurgitation , osteoporosis, Psoriasis sand rheumatoid arthritis. Also history of right TMA, new pacemaker and loop recorder removal on 01/05/22.  Presented on 01/24/22 with fever and chest pain. Admitted  for sepsis with fever 103.   She required fluid resuscitation and began antibiotic therapy. PCCM consulted due to hypoxia and hypotension and admitted to ICU. Recent pacemaker 11/3 for long sinus pauses. EP consulted and pacemaker removed. Began on amiodarone. ID consulted and PICC line placed. ID recommended cefazolin for a week followed by several weeks of linezolid. 2 d echo showed no signs of endocarditis. MRI of lumbar and thoracic spine showed perhaps early discitis but no significant abscesses. Transient pleuritic chest pain on 11/26. Resolved and with VQ scan unremarkable. Has complaints of Nausea and vomiting over past week with abdominal XRAY unremarkable. MRI of brain without acute changes. Felt likely from side effects of antibiotics. AKI on CKD 2 with baseline creat around 1.2 peaked at 2.6. Now improving. Elevated troponin , history of CAD and carotid artery disease. Medical management and follow up as an outpatient. Felt to be demand related in the setting of septic shock. PAF with amiodarone load. To hold off on anticoagulation due to frequent falls. Oral lasix for chronic diastolic heart failure. History of recurrent cdiff now on antibiotics ID recommends oral vancomycin. Continue Floraster. RA steroid dependent started on stress dose steroid in th ICU and  resumed prednisone. Chronic pain from RA to continue on gabapentin and tramadol. SSI for DM.   ON 11/29 new rash found and ID felt to be shingles. Started valacyclovir and airborne precautions.    Patient's medical record from [facility]  has been reviewed by the rehabilitation admission coordinator and physician.   Past Medical History      Past Medical History:  Diagnosis Date   Bruit      Carotid Doppler showed no significant abnormality     9per patient)   C. difficile colitis 07/2018    with severe sepsis   Chest pain, unspecified      Nuclear, May, 2008, no scar or ischemia   Diabetes mellitus     Diverticulosis      Dyslipidemia     Ejection fraction      EF 55-60%, echo, February, 2011 // Echocardiogram 8/21: EF 60-65, no RWMA, Gr 1 DD, GLS -14%, normal RVSF, mild LAE, trivial MR, mild MS (mean 4 mmHg), RVSP 23.4    GERD (gastroesophageal reflux disease)     Mitral regurgitation 04/21/2009    mild,  echo, February, 2011   Osteoporosis     Palpitations      possible very brief atrial fibrillation on monitor and possible reentrant tachycardia   Psoriasis     Rheumatoid arthritis ([place])      Has the patient had major surgery during 100 days prior to admission? Yes   Family History   family history includes Diabetes in her father and mother; Heart attack in her father and mother; Heart failure in her father and mother; Stroke in her sister.   Current Medications   Current Facility-Administered Medications:    0.9 %  sodium chloride infusion, 250 mL, Intravenous, Continuous, Evans Lance, MD, Last Rate: 20 mL/hr at 01/31/22 1650, Infusion Verify at 01/31/22 1650   0.9 %  sodium chloride infusion, , Intravenous, Continuous, Baldwin [place], PA-C, Stopped at 01/31/22 1131   acetaminophen (TYLENOL) tablet 325-650 mg, 325-650 mg, Oral, Q4H PRN, Evans Lance, MD, 650 mg at 02/01/22 0138   amiodarone (PACERONE) tablet 200 mg, 200 mg, Oral, BID, 200 mg at 02/01/22 2112 **FOLLOWED BY** [START ON 02/04/2022] amiodarone (PACERONE) tablet 200 mg, 200 mg, Oral, Daily, Shirley Friar, PA-C   amLODipine (NORVASC) tablet 10 mg, 10 mg, Oral, Daily, Domenic Polite, MD, 10 mg at 01/31/22 3009   ceFAZolin (ANCEF) IVPB 2g/100 mL premix, 2 g, Intravenous, BID, Evans Lance, MD, Stopped at 02/01/22 2148   Chlorhexidine Gluconate Cloth 2 % PADS 6 each, 6 each, Topical, Daily, Evans Lance, MD, 6 each at 02/01/22 0900   docusate sodium (COLACE) capsule 100 mg, 100 mg, Oral, BID PRN, Evans Lance, MD   furosemide (LASIX) tablet 20 mg, 20 mg, Oral, Daily, Domenic Polite, MD, 20 mg at 02/01/22 1501    hydrALAZINE (APRESOLINE) injection 10 mg, 10 mg, Intravenous, Q6H PRN, Evans Lance, MD, 10 mg at 01/29/22 1304   HYDROmorphone (DILAUDID) injection 0.5-1 mg, 0.5-1 mg, Intravenous, Q3H PRN, Evans Lance, MD, 1 mg at 02/02/22 0033   insulin aspart (novoLOG) injection 0-9 Units, 0-9 Units, Subcutaneous, Q4H, Evans Lance, MD, 1 Units at 02/01/22 2111   lactose free nutrition (BOOST PLUS) liquid 237 mL, 237 mL, Oral, BID BM, Evans Lance, MD, 237 mL at 01/31/22 1405   lidocaine (LIDODERM) 5 % 1 patch, 1 patch, Transdermal, Q24H, Evans Lance, MD, 1 patch at 02/02/22  0034   magic mouthwash, 10 mL, Oral, QID, Gherghe, Costin M, MD, 10 mL at 02/01/22 2112   meclizine (ANTIVERT) tablet 12.5 mg, 12.5 mg, Oral, BID, Gherghe, Costin M, MD, 12.5 mg at 02/01/22 2117   multivitamin (RENA-VIT) tablet 1 tablet, 1 tablet, Oral, QHS, Evans Lance, MD, 1 tablet at 02/01/22 2111   ondansetron (ZOFRAN) injection 4 mg, 4 mg, Intravenous, Q8H, Gherghe, Costin M, MD   Oral care mouth rinse, 15 mL, Mouth Rinse, 4 times per day, Evans Lance, MD, 15 mL at 02/01/22 2117   Oral care mouth rinse, 15 mL, Mouth Rinse, PRN, Evans Lance, MD   pantoprazole (PROTONIX) EC tablet 40 mg, 40 mg, Oral, Daily, Evans Lance, MD, 40 mg at 01/31/22 0852   polyethylene glycol (MIRALAX / GLYCOLAX) packet 17 g, 17 g, Oral, Daily PRN, Evans Lance, MD, 17 g at 01/31/22 1818   predniSONE (DELTASONE) tablet 5 mg, 5 mg, Oral, Q breakfast, Evans Lance, MD, 5 mg at 01/31/22 9147   saccharomyces boulardii (FLORASTOR) capsule 250 mg, 250 mg, Oral, BID, Evans Lance, MD, 250 mg at 02/01/22 2112   sodium chloride flush (NS) 0.9 % injection 3 mL, 3 mL, Intravenous, Q12H, Evans Lance, MD, 3 mL at 02/01/22 2117   valACYclovir (VALTREX) tablet 1,000 mg, 1,000 mg, Oral, Daily, Domenic Polite, MD   vancomycin (VANCOCIN) capsule 125 mg, 125 mg, Oral, BID, Vu, Trung T, MD, 125 mg at 02/01/22 2111   Patients Current  Diet:  Diet Order                  Diet Heart Room service appropriate? Yes; Fluid consistency: Thin  Diet effective now                       Precautions / Restrictions Precautions Precautions: Fall Precaution Comments: watch vitals Restrictions Weight Bearing Restrictions: No    Has the patient had 2 or more falls or a fall with injury in the past year? Yes   Prior Activity Level Limited Community (1-2x/wk): mod I with RW   Prior Functional Level Self Care: Did the patient need help bathing, dressing, using the toilet or eating? Independent   Indoor Mobility: Did the patient need assistance with walking from room to room (with or without device)? Independent   Stairs: Did the patient need assistance with internal or external stairs (with or without device)? Needed some help   Functional Cognition: Did the patient need help planning regular tasks such as shopping or remembering to take medications? Needed some help   Patient Information Are you of Hispanic, Latino/a,or Spanish origin?: A. No, not of Hispanic, Latino/a, or Spanish origin What is your race?: A. White Do you need or want an interpreter to communicate with a doctor or health care staff?: 0. No   Patient's Response To:  Health Literacy and Transportation Is the patient able to respond to health literacy and transportation needs?: Yes Health Literacy - How often do you need to have someone help you when you read instructions, pamphlets, or other written material from your doctor or pharmacy?: Never In the past 12 months, has lack of transportation kept you from medical appointments or from getting medications?: No In the past 12 months, has lack of transportation kept you from meetings, work, or from getting things needed for daily living?: No   [place] / [place] Devices/Equipment: Environmental consultant (specify type) Home Equipment:  Rolling Walker (2 wheels), Shower seat   Prior Device  Use: Indicate devices/aids used by the patient prior to current illness, exacerbation or injury? Walker   Current Functional Level Cognition   Overall Cognitive Status: Within Functional Limits for tasks assessed Orientation Level: Oriented X4 Following Commands: Follows one step commands with increased time General Comments: Pt shocked to learn she has shingles, stating "I do?!" when PT mentioned it. Pt states she is not remembering things, pt's daughter present and has to remind her of previous hospitalizations, events during hospitalizations, etc.    Extremity Assessment (includes Sensation/Coordination)   Upper Extremity Assessment: Generalized weakness (hx of RA)  Lower Extremity Assessment: Defer to PT evaluation     ADLs   Overall ADL's : Needs assistance/impaired Eating/Feeding: Set up Grooming: Minimal assistance Upper Body Bathing: Minimal assistance Lower Body Bathing: Moderate assistance Upper Body Dressing : Minimal assistance Lower Body Dressing: Moderate assistance Toilet Transfer: Minimal assistance, Stand-pivot, BSC/3in1 Toilet Transfer Details (indicate cue type and reason): stand pivot eob to 3n1 Toileting- Clothing Manipulation and Hygiene: Minimal assistance, Sit to/from stand Tub/ Shower Transfer: Minimal assistance Functional mobility during ADLs: Minimal assistance, Rolling walker (2 wheels)     Mobility   Overal bed mobility: Needs Assistance Bed Mobility: Supine to Sit Supine to sit: Min guard, HOB elevated General bed mobility comments: EOB with daughter     Transfers   Overall transfer level: Needs assistance Equipment used: Rolling walker (2 wheels) Transfers: Sit to/from Stand, Bed to chair/wheelchair/BSC Sit to Stand: Min assist Bed to/from chair/wheelchair/BSC transfer type:: Step pivot Step pivot transfers: Min assist General transfer comment: eob to bsc then able to ambulate around bed to recliner     Ambulation / Gait / Stairs / Scientist, clinical (histocompatibility and immunogenetics)   Ambulation/Gait Ambulation/Gait assistance: Herbalist (Feet): 12 Feet Assistive device: Rolling walker (2 wheels) Gait Pattern/deviations: Step-through pattern, Decreased stride length, Trunk flexed, Decreased dorsiflexion - left, Decreased dorsiflexion - right General Gait Details: cues for upright posture, increasing foot clearance. light assist to steady and guide RW around the room. Gait velocity: decr Gait velocity interpretation: <1.31 ft/sec, indicative of household ambulator     Posture / Balance Dynamic Sitting Balance Sitting balance - Comments: Static sitting EOB with supervision for safety Balance Overall balance assessment: Needs assistance Sitting-balance support: No upper extremity supported, Feet supported Sitting balance-Leahy Scale: Fair Sitting balance - Comments: Static sitting EOB with supervision for safety Standing balance support: Bilateral upper extremity supported, During functional activity, Reliant on assistive device for balance Standing balance-Leahy Scale: Poor Standing balance comment: Reliant on RW and up to minA     Special needs/care consideration Fall precautions Airborne precautions due to shingles    Previous Home Environment  Living Arrangements: Alone  Lives With: Alone Available Help at Discharge: Family, Available 24 hours/day (daughter can arrange assist as needed) Type of Home: [place]: Laundry or work area in basement, Able to live on main level with bedroom/bathroom Home Access: Stairs to enter, Other (comment) Entrance Stairs-Rails: Left Entrance Stairs-Number of Steps: 2 Bathroom Shower/Tub: Multimedia programmer: Handicapped height Bathroom Accessibility: Yes How Accessible: Accessible via walker [place]: No Additional Comments: entry 01/25/21: pt had walk-in shower and handicapped height toilet   Discharge Living Setting Plans for Discharge Living Setting: Patient's home,  Alone Type of Home at Discharge: House Discharge Home Layout: Laundry or work area in basement Discharge Home Access: Stairs to enter Entrance Stairs-Rails: Left Entrance Stairs-Number of Steps:  2 Discharge Bathroom Shower/Tub: Walk-in shower Discharge Bathroom Toilet: Handicapped height Discharge Bathroom Accessibility: Yes How Accessible: Accessible via walker Does the patient have any problems obtaining your medications?: No   Social/Family/Support Systems Patient Roles: Parent Contact Information: daughter, [name] Anticipated Caregiver: daughter and family Anticipated Caregiver's Contact Information: see contacts Caregiver Availability: 24/7 Discharge Plan Discussed with Primary Caregiver: Yes Is Caregiver In Agreement with Plan?: Yes Does Caregiver/Family have Issues with Lodging/Transportation while Pt is in Rehab?: No   Goals Patient/Family Goal for Rehab: Mod I to supervision with PT and OT Expected length of stay: ELOS 10 to 14 days Additional Information: Airborne precautions due to Shingles Pt/Family Agrees to Admission and willing to participate: Yes Program Orientation Provided & Reviewed with Pt/Caregiver Including Roles  & Responsibilities: Yes   Decrease burden of Care through IP rehab admission: n/a   Possible need for SNF placement upon discharge: not anticipated   Patient Condition: I have reviewed medical [facility] from [facility], spoken with CM, and patient, daughter, and family member. I met with patient at the bedside for inpatient rehabilitation assessment.  Patient will benefit from ongoing PT and OT, can actively participate in 3 hours of therapy a day 5 days of the week, and can make measurable gains during the admission.  Patient will also benefit from the coordinated team approach during an Inpatient Acute Rehabilitation admission.  The patient will receive intensive therapy as well as Rehabilitation physician, nursing, social worker, and care  management interventions.  Due to bladder management, bowel management, safety, skin/wound care, disease management, medication administration, pain management, and patient education the patient requires 24 hour a day rehabilitation nursing.  The patient is currently min to mod assist overall with mobility and basic ADLs.  Discharge setting and therapy post discharge at home with home health is anticipated.  Patient has agreed to participate in the Acute Inpatient Rehabilitation Program and will admit today.   Preadmission Screen Completed By:  Cleatrice Burke, 02/02/2022 12:28 PM ______________________________________________________________________   Discussed status with Dr. Ranell Patrick on 02/02/22 at 1250 and received approval for admission today.   Admission Coordinator:  Cleatrice Burke, RN, time 1250 Date 02/02/22    Assessment/Plan: Diagnosis: Debility secondary to sepsis Does the need for close, 24 hr/day Medical supervision in concert with the patient's rehab needs make it unreasonable for this patient to be served in a less intensive setting? Yes Co-Morbidities requiring supervision/potential complications: low back pain, nausea, decreased appetite, diarrhea, hypotension Due to bladder management, bowel management, safety, skin/wound care, disease management, medication administration, pain management, and patient education, does the patient require 24 hr/day rehab nursing? Yes Does the patient require coordinated care of a physician, rehab nurse, PT, OT to address physical and functional deficits in the context of the above medical diagnosis(es)? Yes Addressing deficits in the following areas: balance, endurance, locomotion, strength, transferring, bowel/bladder control, bathing, dressing, feeding, grooming, toileting, and psychosocial support Can the patient actively participate in an intensive therapy program of at least 3 hrs of therapy 5 days a week? Yes The potential for  patient to make measurable gains while on inpatient rehab is excellent Anticipated functional outcomes upon discharge from inpatient rehab: supervision PT, supervision OT, independent SLP Estimated rehab length of stay to reach the above functional goals is: 8-12 days  Anticipated discharge destination: Home 10. Overall Rehab/Functional Prognosis: excellent     MD Signature: Leeroy Cha, MD         Revision History

## 2022-02-02 NOTE — Progress Notes (Signed)
Patient ID: Shelby Walker, female   DOB: 1933/06/16, 88 y.o.   MRN: [id number] INPATIENT REHABILITATION ADMISSION NOTE   Arrival Method: bed      Mental Orientation: A&O x4   Assessment: per flowsheet    Skin:   IV'S: single lumen PICC   Pain: 0/10   Tubes and Drains: none   Safety Measures: 3 side rails up    Vital Signs: completed per flowsheet    Height and Weight: 5'1 58.4 kg    Rehab Orientation: completed   Family: daughter at bedside     Notes:

## 2022-02-02 NOTE — PMR Pre-admission (Signed)
PMR Admission Coordinator Pre-Admission Assessment  Patient: Shelby Walker is an 88 y.o., female MRN: [id number] DOB: 05-26-1933 Height: _0  (154.9 cm) Weight: 52.9 kg  Insurance Information HMO:     PPO:      PCP:      IPA:      80/20:      OTHER:  PRIMARY: Medicare a and b      Policy#: [id number]      Subscriber: pt Benefits:  Phone #: passport one source     Name: 11/30 Eff. Date: 06/04/1999     Deduct: $1600      Out of Pocket Max: none      Life Max: none CIR: 100%      SNF: 20 full days Outpatient: 80%     Co-Pay: 20% Home Health: 100%      Co-Pay: none DME: 80%     Co-Pay: 20% Providers: pt choice  SECONDARY: [employer]      Policy#: [id number]  Financial Counselor:       Phone#:   The "Data Collection Information Summary" for patients in Inpatient Rehabilitation Facilities with attached "Privacy Act [place] Records" was provided and verbally reviewed with: Patient and Family  Emergency Contact Information Contact Information     Name Relation Home Work Mobile   [place] Daughter   [phone]   [place], [place]   [phone]      Current Medical History  Patient Admitting Diagnosis: Debility  History of Present Illness: 88 year old female with history of cdiff, DM, diverticulosis, HLD, GERD, osteomyelitis of lumbar spine, Mitral regurgitation , osteoporosis, Psoriasis sand rheumatoid arthritis. Also history of right TMA, new pacemaker and loop recorder removal on 01/05/22.  Presented on 01/24/22 with fever and chest pain. Admitted for sepsis with fever 103.  She required fluid resuscitation and began antibiotic therapy. PCCM consulted due to hypoxia and hypotension and admitted to ICU. Recent pacemaker 11/3 for long sinus pauses. EP consulted and pacemaker removed. Began on amiodarone. ID consulted and PICC line placed. ID recommended cefazolin for a week followed by several weeks of linezolid. 2 d echo showed no signs of endocarditis. MRI of lumbar and  thoracic spine showed perhaps early discitis but no significant abscesses. Transient pleuritic chest pain on 11/26. Resolved and with VQ scan unremarkable. Has complaints of Nausea and vomiting over past week with abdominal XRAY unremarkable. MRI of brain without acute changes. Felt likely from side effects of antibiotics. AKI on CKD 2 with baseline creat around 1.2 peaked at 2.6. Now improving. Elevated troponin , history of CAD and carotid artery disease. Medical management and follow up as an outpatient. Felt to be demand related in the setting of septic shock. PAF with amiodarone load. To hold off on anticoagulation due to frequent falls. Oral lasix for chronic diastolic heart failure. History of recurrent cdiff now on antibiotics ID recommends oral vancomycin. Continue Floraster. RA steroid dependent started on stress dose steroid in th ICU and resumed prednisone. Chronic pain from RA to continue on gabapentin and tramadol. SSI for DM.  ON 11/29 new rash found and ID felt to be shingles. Started valacyclovir and airborne precautions.   Patient's medical record from [facility]  has been reviewed by the rehabilitation admission coordinator and physician.  Past Medical History  Past Medical History:  Diagnosis Date   Bruit    Carotid Doppler showed no significant abnormality     9per patient)   C. difficile colitis 07/2018  with severe sepsis   Chest pain, unspecified    Nuclear, May, 2008, no scar or ischemia   Diabetes mellitus    Diverticulosis    Dyslipidemia    Ejection fraction    EF 55-60%, echo, February, 2011 // Echocardiogram 8/21: EF 60-65, no RWMA, Gr 1 DD, GLS -14%, normal RVSF, mild LAE, trivial MR, mild MS (mean 4 mmHg), RVSP 23.4    GERD (gastroesophageal reflux disease)    Mitral regurgitation 04/21/2009   mild,  echo, February, 2011   Osteoporosis    Palpitations    possible very brief atrial fibrillation on monitor and possible reentrant tachycardia    Psoriasis    Rheumatoid arthritis ([place])    Has the patient had major surgery during 100 days prior to admission? Yes  Family History   family history includes Diabetes in her father and mother; Heart attack in her father and mother; Heart failure in her father and mother; Stroke in her sister.  Current Medications  Current Facility-Administered Medications:    0.9 %  sodium chloride infusion, 250 mL, Intravenous, Continuous, Evans Lance, MD, Last Rate: 20 mL/hr at 01/31/22 1650, Infusion Verify at 01/31/22 1650   0.9 %  sodium chloride infusion, , Intravenous, Continuous, Baldwin [place], PA-C, Stopped at 01/31/22 1131   acetaminophen (TYLENOL) tablet 325-650 mg, 325-650 mg, Oral, Q4H PRN, Evans Lance, MD, 650 mg at 02/01/22 0138   amiodarone (PACERONE) tablet 200 mg, 200 mg, Oral, BID, 200 mg at 02/01/22 2112 **FOLLOWED BY** [START ON 02/04/2022] amiodarone (PACERONE) tablet 200 mg, 200 mg, Oral, Daily, Shirley Friar, PA-C   amLODipine (NORVASC) tablet 10 mg, 10 mg, Oral, Daily, Domenic Polite, MD, 10 mg at 01/31/22 0786   ceFAZolin (ANCEF) IVPB 2g/100 mL premix, 2 g, Intravenous, BID, Evans Lance, MD, Stopped at 02/01/22 2148   Chlorhexidine Gluconate Cloth 2 % PADS 6 each, 6 each, Topical, Daily, Evans Lance, MD, 6 each at 02/01/22 0900   docusate sodium (COLACE) capsule 100 mg, 100 mg, Oral, BID PRN, Evans Lance, MD   furosemide (LASIX) tablet 20 mg, 20 mg, Oral, Daily, Domenic Polite, MD, 20 mg at 02/01/22 1501   hydrALAZINE (APRESOLINE) injection 10 mg, 10 mg, Intravenous, Q6H PRN, Evans Lance, MD, 10 mg at 01/29/22 1304   HYDROmorphone (DILAUDID) injection 0.5-1 mg, 0.5-1 mg, Intravenous, Q3H PRN, Evans Lance, MD, 1 mg at 02/02/22 0033   insulin aspart (novoLOG) injection 0-9 Units, 0-9 Units, Subcutaneous, Q4H, Evans Lance, MD, 1 Units at 02/01/22 2111   lactose free nutrition (BOOST PLUS) liquid 237 mL, 237 mL, Oral, BID BM, Evans Lance, MD, 237 mL at 01/31/22 1405   lidocaine (LIDODERM) 5 % 1 patch, 1 patch, Transdermal, Q24H, Evans Lance, MD, 1 patch at 02/02/22 0034   magic mouthwash, 10 mL, Oral, QID, Cruzita Lederer, Costin M, MD, 10 mL at 02/01/22 2112   meclizine (ANTIVERT) tablet 12.5 mg, 12.5 mg, Oral, BID, Cruzita Lederer, Costin M, MD, 12.5 mg at 02/01/22 2117   multivitamin (RENA-VIT) tablet 1 tablet, 1 tablet, Oral, QHS, Evans Lance, MD, 1 tablet at 02/01/22 2111   ondansetron (ZOFRAN) injection 4 mg, 4 mg, Intravenous, Q8H, Gherghe, Costin M, MD   Oral care mouth rinse, 15 mL, Mouth Rinse, 4 times per day, Evans Lance, MD, 15 mL at 02/01/22 2117   Oral care mouth rinse, 15 mL, Mouth Rinse, PRN, Evans Lance, MD   pantoprazole (PROTONIX) EC tablet  40 mg, 40 mg, Oral, Daily, Evans Lance, MD, 40 mg at 01/31/22 0852   polyethylene glycol (MIRALAX / GLYCOLAX) packet 17 g, 17 g, Oral, Daily PRN, Evans Lance, MD, 17 g at 01/31/22 1818   predniSONE (DELTASONE) tablet 5 mg, 5 mg, Oral, Q breakfast, Evans Lance, MD, 5 mg at 01/31/22 4431   saccharomyces boulardii (FLORASTOR) capsule 250 mg, 250 mg, Oral, BID, Evans Lance, MD, 250 mg at 02/01/22 2112   sodium chloride flush (NS) 0.9 % injection 3 mL, 3 mL, Intravenous, Q12H, Evans Lance, MD, 3 mL at 02/01/22 2117   valACYclovir (VALTREX) tablet 1,000 mg, 1,000 mg, Oral, Daily, Domenic Polite, MD   vancomycin (VANCOCIN) capsule 125 mg, 125 mg, Oral, BID, Vu, Trung T, MD, 125 mg at 02/01/22 2111  Patients Current Diet:  Diet Order             Diet Heart Room service appropriate? Yes; Fluid consistency: Thin  Diet effective now                  Precautions / Restrictions Precautions Precautions: Fall Precaution Comments: watch vitals Restrictions Weight Bearing Restrictions: No   Has the patient had 2 or more falls or a fall with injury in the past year? Yes  Prior Activity Level Limited Community (1-2x/wk): mod I with RW  Prior  Functional Level Self Care: Did the patient need help bathing, dressing, using the toilet or eating? Independent  Indoor Mobility: Did the patient need assistance with walking from room to room (with or without device)? Independent  Stairs: Did the patient need assistance with internal or external stairs (with or without device)? Needed some help  Functional Cognition: Did the patient need help planning regular tasks such as shopping or remembering to take medications? Needed some help  Patient Information Are you of Hispanic, Latino/a,or Spanish origin?: A. No, not of Hispanic, Latino/a, or Spanish origin What is your race?: A. White Do you need or want an interpreter to communicate with a doctor or health care staff?: 0. No  Patient's Response To:  Health Literacy and Transportation Is the patient able to respond to health literacy and transportation needs?: Yes Health Literacy - How often do you need to have someone help you when you read instructions, pamphlets, or other written material from your doctor or pharmacy?: Never In the past 12 months, has lack of transportation kept you from medical appointments or from getting medications?: No In the past 12 months, has lack of transportation kept you from meetings, work, or from getting things needed for daily living?: No  Home Assistive Devices / [place] Devices/Equipment: Environmental consultant (specify type) Home Equipment: Conservation officer, nature (2 wheels), Shower seat  Prior Device Use: Indicate devices/aids used by the patient prior to current illness, exacerbation or injury? Walker  Current Functional Level Cognition  Overall Cognitive Status: Within Functional Limits for tasks assessed Orientation Level: Oriented X4 Following Commands: Follows one step commands with increased time General Comments: Pt shocked to learn she has shingles, stating "I do?!" when PT mentioned it. Pt states she is not remembering things, pt's daughter  present and has to remind her of previous hospitalizations, events during hospitalizations, etc.    Extremity Assessment (includes Sensation/Coordination)  Upper Extremity Assessment: Generalized weakness (hx of RA)  Lower Extremity Assessment: Defer to PT evaluation    ADLs  Overall ADL's : Needs assistance/impaired Eating/Feeding: Set up Grooming: Minimal assistance Upper Body Bathing: Minimal assistance Lower  Body Bathing: Moderate assistance Upper Body Dressing : Minimal assistance Lower Body Dressing: Moderate assistance Toilet Transfer: Minimal assistance, Stand-pivot, BSC/3in1 Toilet Transfer Details (indicate cue type and reason): stand pivot eob to 3n1 Toileting- Clothing Manipulation and Hygiene: Minimal assistance, Sit to/from stand Tub/ Shower Transfer: Minimal assistance Functional mobility during ADLs: Minimal assistance, Rolling walker (2 wheels)    Mobility  Overal bed mobility: Needs Assistance Bed Mobility: Supine to Sit Supine to sit: Min guard, HOB elevated General bed mobility comments: EOB with daughter    Transfers  Overall transfer level: Needs assistance Equipment used: Rolling walker (2 wheels) Transfers: Sit to/from Stand, Bed to chair/wheelchair/BSC Sit to Stand: Min assist Bed to/from chair/wheelchair/BSC transfer type:: Step pivot Step pivot transfers: Min assist General transfer comment: eob to bsc then able to ambulate around bed to recliner    Ambulation / Gait / Stairs / Emergency planning/management officer  Ambulation/Gait Ambulation/Gait assistance: Herbalist (Feet): 12 Feet Assistive device: Rolling walker (2 wheels) Gait Pattern/deviations: Step-through pattern, Decreased stride length, Trunk flexed, Decreased dorsiflexion - left, Decreased dorsiflexion - right General Gait Details: cues for upright posture, increasing foot clearance. light assist to steady and guide RW around the room. Gait velocity: decr Gait velocity interpretation:  <1.31 ft/sec, indicative of household ambulator    Posture / Balance Dynamic Sitting Balance Sitting balance - Comments: Static sitting EOB with supervision for safety Balance Overall balance assessment: Needs assistance Sitting-balance support: No upper extremity supported, Feet supported Sitting balance-Leahy Scale: Fair Sitting balance - Comments: Static sitting EOB with supervision for safety Standing balance support: Bilateral upper extremity supported, During functional activity, Reliant on assistive device for balance Standing balance-Leahy Scale: Poor Standing balance comment: Reliant on RW and up to minA    Special needs/care consideration Fall precautions Airborne precautions due to shingles   Previous Home Environment  Living Arrangements: Alone  Lives With: Alone Available Help at Discharge: Family, Available 24 hours/day (daughter can arrange assist as needed) Type of Home: [place]: Laundry or work area in basement, Able to live on main level with bedroom/bathroom Home Access: Stairs to enter, Other (comment) Entrance Stairs-Rails: Left Entrance Stairs-Number of Steps: 2 Bathroom Shower/Tub: Multimedia programmer: Handicapped height Bathroom Accessibility: Yes How Accessible: Accessible via walker [place]: No Additional Comments: entry 01/25/21: pt had walk-in shower and handicapped height toilet  Discharge Living Setting Plans for Discharge Living Setting: Patient's home, Alone Type of Home at Discharge: House Discharge Home Layout: Laundry or work area in basement Discharge Home Access: Stairs to enter Entrance Stairs-Rails: Horticulturist, commercial of Steps: 2 Discharge Bathroom Shower/Tub: Walk-in shower Discharge Bathroom Toilet: Handicapped height Discharge Bathroom Accessibility: Yes How Accessible: Accessible via walker Does the patient have any problems obtaining your medications?: No  Social/Family/Support  Systems Patient Roles: Parent Contact Information: daughter, [name] Anticipated Caregiver: daughter and family Anticipated Caregiver's Contact Information: see contacts Caregiver Availability: 24/7 Discharge Plan Discussed with Primary Caregiver: Yes Is Caregiver In Agreement with Plan?: Yes Does Caregiver/Family have Issues with Lodging/Transportation while Pt is in Rehab?: No  Goals Patient/Family Goal for Rehab: Mod I to supervision with PT and OT Expected length of stay: ELOS 10 to 14 days Additional Information: Airborne precautions due to Shingles Pt/Family Agrees to Admission and willing to participate: Yes Program Orientation Provided & Reviewed with Pt/Caregiver Including Roles  & Responsibilities: Yes  Decrease burden of Care through IP rehab admission: n/a  Possible need for SNF placement upon discharge: not anticipated  Patient Condition: I have reviewed medical records from [facility], spoken with CM, and patient, daughter, and family member. I met with patient at the bedside for inpatient rehabilitation assessment.  Patient will benefit from ongoing PT and OT, can actively participate in 3 hours of therapy a day 5 days of the week, and can make measurable gains during the admission.  Patient will also benefit from the coordinated team approach during an Inpatient Acute Rehabilitation admission.  The patient will receive intensive therapy as well as Rehabilitation physician, nursing, social worker, and care management interventions.  Due to bladder management, bowel management, safety, skin/wound care, disease management, medication administration, pain management, and patient education the patient requires 24 hour a day rehabilitation nursing.  The patient is currently min to mod assist overall with mobility and basic ADLs.  Discharge setting and therapy post discharge at home with home health is anticipated.  Patient has agreed to participate in the Acute Inpatient  Rehabilitation Program and will admit today.  Preadmission Screen Completed By:  Cleatrice Burke, 02/02/2022 12:28 PM ______________________________________________________________________   Discussed status with Dr. Ranell Patrick on 02/02/22 at 1250 and received approval for admission today.  Admission Coordinator:  Cleatrice Burke, RN, time 1250 Date 02/02/22   Assessment/Plan: Diagnosis: Debility secondary to sepsis Does the need for close, 24 hr/day Medical supervision in concert with the patient's rehab needs make it unreasonable for this patient to be served in a less intensive setting? Yes Co-Morbidities requiring supervision/potential complications: low back pain, nausea, decreased appetite, diarrhea, hypotension Due to bladder management, bowel management, safety, skin/wound care, disease management, medication administration, pain management, and patient education, does the patient require 24 hr/day rehab nursing? Yes Does the patient require coordinated care of a physician, rehab nurse, PT, OT to address physical and functional deficits in the context of the above medical diagnosis(es)? Yes Addressing deficits in the following areas: balance, endurance, locomotion, strength, transferring, bowel/bladder control, bathing, dressing, feeding, grooming, toileting, and psychosocial support Can the patient actively participate in an intensive therapy program of at least 3 hrs of therapy 5 days a week? Yes The potential for patient to make measurable gains while on inpatient rehab is excellent Anticipated functional outcomes upon discharge from inpatient rehab: supervision PT, supervision OT, independent SLP Estimated rehab length of stay to reach the above functional goals is: 8-12 days  Anticipated discharge destination: Home 10. Overall Rehab/Functional Prognosis: excellent   MD Signature: Leeroy Cha, MD

## 2022-02-02 NOTE — H&P (Signed)
Physical Medicine and Rehabilitation Admission H&P    CC: Debility secondary to septic shock due to MSSA bacteremia  HPI: Shelby Walker is a 88 year old female recently had pacemaker insertion who presented to the emergency department on 01/24/2022 after found unresponsive in her bed by family.  She was incontinent of stool and urine.  She had also vomited.  Her initial blood pressure was 60 palpable by EMS.  O2 sat was 70% on room air.  Code sepsis called.  Patient was placed on 15 L nonrebreather mask, given Rocephin, vancomycin and Flagyl.  IV fluid resuscitation.  Started on Levophed due to worsening blood pressure.  Laboratory workup as well as CT of the chest abdomen pelvis ordered.  Admitted to critical care medicine.  VQ scan negative for pulmonary embolism.  CT of abdomen pelvis without hydronephrosis but demonstrated pyelonephritis.  MRI of brain unremarkable. Urinalysis consistent with UTI.  He has a history of rheumatoid arthritis maintained on prednisone 4 mg daily.  She was given stress dose of steroids.  Medical history is significant for prior severe C. difficile colitis and C. difficile toxin ordered.  Status improved.  Disease consultation obtained 11/23.  He was started on prophylaxis oral vancomycin.  She was transferred to [facility] service on 11/24.  Electrophysiology consultation obtained that day.  Patient noted to have elevated INR and DIC was ruled out.  She underwent pacemaker explantation on 1/27.  MRI of the lumbosacral and thoracic spine was formed due to back pain.  MRI findings included L3-4 discitis/osteomyelitis and left psoas edema.  Dr. Daryll Brod recommends continuing cefazolin until 12/06 and transition to oral antibiotics to finish 6 weeks treatment.  He recommends continuing prophylaxis p.o. vancomycin until 1 week off systemic antibiotics. New zoster like rash along left lower lumbar/sacral area.  Airborne/contact isolation placed 11/29. Ongoing nausea. On regular diet. Prednisone  at home for RA continued. The patient requires inpatient physical medicine and rehabilitation evaluations and treatment secondary to dysfunction due to bacteremia, sepsis, discitis. She is currently complaining of back pain  PMH significant for right TMA. Ambulates with walker.   Review of Systems  Constitutional:  Negative for fever.  HENT:  Positive for hearing loss. Negative for sore throat.   Eyes:  Negative for double vision.       Sees spots when upright OOB  Respiratory:  Positive for cough. Negative for shortness of breath.   Cardiovascular:  Negative for chest pain and leg swelling.  Gastrointestinal:  Positive for abdominal pain and nausea. Negative for constipation.       Lack of appetite  Genitourinary:  Negative for dysuria and urgency.  Musculoskeletal:  Positive for back pain and joint pain.  Neurological:  Negative for dizziness and headaches.  Psychiatric/Behavioral:  Negative for depression. The patient does not have insomnia.    Past Medical History:  Diagnosis Date   Bruit    Carotid Doppler showed no significant abnormality     9per patient)   C. difficile colitis 07/2018   with severe sepsis   Chest pain, unspecified    Nuclear, May, 2008, no scar or ischemia   Diabetes mellitus    Diverticulosis    Dyslipidemia    Ejection fraction    EF 55-60%, echo, February, 2011 // Echocardiogram 8/21: EF 60-65, no RWMA, Gr 1 DD, GLS -14%, normal RVSF, mild LAE, trivial MR, mild MS (mean 4 mmHg), RVSP 23.4    GERD (gastroesophageal reflux disease)    Mitral regurgitation 04/21/2009  mild,  echo, February, 2011   Osteoporosis    Palpitations    possible very brief atrial fibrillation on monitor and possible reentrant tachycardia   Psoriasis    Rheumatoid arthritis [facility])    Past Surgical History:  Procedure Laterality Date   ABDOMINAL AORTOGRAM W/LOWER EXTREMITY Right 10/19/2020   Procedure: ABDOMINAL AORTOGRAM W/LOWER EXTREMITY;  Surgeon: Wellington Hampshire, MD;   Location: [place] [facility];  Service: Cardiovascular;  Laterality: Right;   FRACTURE SURGERY     LOOP RECORDER REMOVAL N/A 01/05/2022   Procedure: LOOP RECORDER REMOVAL;  Surgeon: Evans Lance, MD;  Location: [place] [facility];  Service: Cardiovascular;  Laterality: N/A;   PACEMAKER IMPLANT N/A 01/05/2022   Procedure: PACEMAKER IMPLANT;  Surgeon: Evans Lance, MD;  Location: [place] [facility];  Service: Cardiovascular;  Laterality: N/A;   PPM GENERATOR REMOVAL N/A 01/29/2022   Procedure: PPM GENERATOR REMOVAL;  Surgeon: Evans Lance, MD;  Location: [place] [facility];  Service: Cardiovascular;  Laterality: N/A;   TEE WITHOUT CARDIOVERSION N/A 01/29/2022   Procedure: TRANSESOPHAGEAL ECHOCARDIOGRAM (TEE);  Surgeon: Geralynn Rile, MD;  Location: [place];  Service: Cardiovascular;  Laterality: N/A;   TRANSMETATARSAL AMPUTATION Right 01/18/2021   Procedure: TRANSMETATARSAL AMPUTATION;  Surgeon: Wylene Simmer, MD;  Location: [place];  Service: Orthopedics;  Laterality: Right;   Family History  Problem Relation Age of Onset   Heart failure Father    Heart attack Father    Diabetes Father    Heart attack Mother    Heart failure Mother    Diabetes Mother    Stroke Sister    Social History:  reports that she has never smoked. She has never used smokeless tobacco. She reports that she does not drink alcohol and does not use drugs. Allergies:  Allergies  Allergen Reactions   Cholestyramine     Possible- Makes throat burn. Patient said she can't take it    Compazine [Prochlorperazine Edisylate] Swelling    REACTION: " tongue swell and unable to swallow"   Sulfonamide Derivatives Other (See Comments)    REACTION: " broke out with fine itching bumps"   Hydrocodone Other (See Comments)   Infliximab Other (See Comments)    Other reaction(s): rash   Leflunomide     Other reaction(s): diarrhea   Prochlorperazine Other (See Comments)    Other reaction(s): Unknown    Rosuvastatin     Other reaction(s): muscle aches   Sulfa Antibiotics Other (See Comments)    Other reaction(s): tongue swelling   Medications Prior to Admission  Medication Sig Dispense Refill   acetaminophen (TYLENOL) 650 MG CR tablet Take 1,300 mg by mouth every 8 (eight) hours as needed for pain.     acetaminophen-codeine (TYLENOL #3) 300-30 MG tablet Take 1 tablet by mouth in the morning, at noon, and at bedtime. (Patient taking differently: Take 1 tablet by mouth as needed for moderate pain.) 24 tablet 0   amiodarone (PACERONE) 200 MG tablet Take 1 tablet (200 mg total) by mouth 2 (two) times daily for 3 days, THEN 1 tablet (200 mg total) daily. 10 tablet 0   [START ON 02/05/2022] amLODipine (NORVASC) 10 MG tablet Take 1 tablet (10 mg total) by mouth daily.     atorvastatin (LIPITOR) 20 MG tablet Take 1 tablet (20 mg total) by mouth daily. 90 tablet 3   Calcium Carbonate-Vitamin D 600-400 MG-UNIT tablet Take 1 tablet by mouth daily at 12 noon.  ceFAZolin (ANCEF) 2-4 GM/100ML-% IVPB Inject 100 mLs (2 g total) into the vein 2 (two) times daily. 1 each    diclofenac Sodium (VOLTAREN) 1 % GEL Apply 2 g topically 4 (four) times daily. 350 g 0   furosemide (LASIX) 20 MG tablet Take 1 tablet (20 mg total) by mouth daily. 30 tablet    gabapentin (NEURONTIN) 100 MG capsule Take 1 capsule (100 mg total) by mouth 3 (three) times daily. 90 capsule 0   insulin aspart (NOVOLOG) 100 UNIT/ML FlexPen Inject 3 Units into the skin 3 (three) times daily with meals. Sliding scale (Patient taking differently: Inject 6-8 Units into the skin 3 (three) times daily with meals. Sliding scale) 15 mL 11   Insulin Degludec (TRESIBA Atkins) Inject 6-8 Units into the skin at bedtime.     [START ON 02/03/2022] lidocaine (LIDODERM) 5 % Place 1 patch onto the skin daily. Remove & Discard patch within 12 hours or as directed by MD 30 patch 0   lip balm (CARMEX) ointment Apply topically as needed for lip care. 7 g 0   liver  oil-zinc oxide (DESITIN) 40 % ointment Apply 1 application topically 3 (three) times daily. To raw areas in between/on buttocks 56.7 g 0   Multiple Vitamins-Minerals (CENTRUM SILVER 50+WOMEN PO) Take 1 tablet by mouth daily.     nitroGLYCERIN (NITROSTAT) 0.4 MG SL tablet Dissolve 1 tablet under the tongue every 5 minutes as needed for chest pain. Max of 3 doses, then 911. 25 tablet 6   Nutritional Supplements (ENSURE ACTIVE) LIQD Take 237 mLs by mouth in the morning. Prefers Chocolate     pantoprazole (PROTONIX) 40 MG tablet Take 1 tablet (40 mg total) by mouth daily.     predniSONE (DELTASONE) 5 MG tablet Take 1 tablet (5 mg total) by mouth daily with breakfast.     pyrithione zinc (SELSUN BLUE DRY SCALP) 1 % shampoo Apply 1 application topically once a week.     RESTASIS 0.05 % ophthalmic emulsion Place 1 drop into both eyes 2 (two) times daily.     saccharomyces boulardii (FLORASTOR) 250 MG capsule Take 250 mg by mouth 2 (two) times daily.     traMADol (ULTRAM) 50 MG tablet Take 50 mg by mouth 2 (two) times daily as needed for moderate pain or severe pain.     valACYclovir (VALTREX) 1000 MG tablet Take 1 tablet (1,000 mg total) by mouth daily.     vancomycin (VANCOCIN) 125 MG capsule Take 1 capsule (125 mg total) by mouth 2 (two) times daily.        Expand All Collapse All       Physical Medicine and Rehabilitation Admission H&P     CC: Debility secondary to septic shock due to MSSA bacteremia   HPI: Shelby Walker is a 88 year old female recently had pacemaker insertion who presented to the emergency department on 01/24/2022 after found unresponsive in her bed by family.  She was incontinent of stool and urine.  She had also vomited.  Her initial blood pressure was 60 palpable by EMS.  O2 sat was 70% on room air.  Code sepsis called.  Patient was placed on 15 L nonrebreather mask, given Rocephin, vancomycin and Flagyl.  IV fluid resuscitation.  Started on Levophed due to worsening blood  pressure.  Laboratory workup as well as CT of the chest abdomen pelvis ordered.  Admitted to critical care medicine.  VQ scan negative for pulmonary embolism.  CT of abdomen pelvis without hydronephrosis  but demonstrated pyelonephritis.  MRI of brain unremarkable. Urinalysis consistent with UTI.  He has a history of rheumatoid arthritis maintained on prednisone 4 mg daily.  She was given stress dose of steroids.  Medical history is significant for prior severe C. difficile colitis and C. difficile toxin ordered.  Status improved.  Disease consultation obtained 11/23.  He was started on prophylaxis oral vancomycin.  She was transferred to [facility] service on 11/24.  Electrophysiology consultation obtained that day.  Patient noted to have elevated INR and DIC was ruled out.  She underwent pacemaker explantation on 1/27.  MRI of the lumbosacral and thoracic spine was formed due to back pain.  MRI findings included L3-4 discitis/osteomyelitis and left psoas edema.  Dr. Daryll Brod recommends continuing cefazolin until 12/06 and transition to oral antibiotics to finish 6 weeks treatment.  He recommends continuing prophylaxis p.o. vancomycin until 1 week off systemic antibiotics. New zoster like rash along left lower lumbar/sacral area.  Airborne/contact isolation placed 11/29. Ongoing nausea. On regular diet. Prednisone at home for RA continued. The patient requires inpatient physical medicine and rehabilitation evaluations and treatment secondary to dysfunction due to bacteremia, sepsis, discitis.   PMH significant for right TMA. Ambulates with walker.     Review of Systems  Constitutional:  Negative for fever.  HENT:  Positive for hearing loss. Negative for sore throat.   Eyes:  Negative for double vision.       Sees spots when upright OOB  Respiratory:  Positive for cough. Negative for shortness of breath.   Cardiovascular:  Negative for chest pain and leg swelling.  Gastrointestinal:  Positive for abdominal pain and  nausea. Negative for constipation.       Lack of appetite  Genitourinary:  Negative for dysuria and urgency.  Musculoskeletal:  Positive for back pain and joint pain.  Neurological:  Negative for dizziness and headaches.  Psychiatric/Behavioral:  Negative for depression. The patient does not have insomnia.         Past Medical History:  Diagnosis Date   Bruit      Carotid Doppler showed no significant abnormality     9per patient)   C. difficile colitis 07/2018    with severe sepsis   Chest pain, unspecified      Nuclear, May, 2008, no scar or ischemia   Diabetes mellitus     Diverticulosis     Dyslipidemia     Ejection fraction      EF 55-60%, echo, February, 2011 // Echocardiogram 8/21: EF 60-65, no RWMA, Gr 1 DD, GLS -14%, normal RVSF, mild LAE, trivial MR, mild MS (mean 4 mmHg), RVSP 23.4    GERD (gastroesophageal reflux disease)     Mitral regurgitation 04/21/2009    mild,  echo, February, 2011   Osteoporosis     Palpitations      possible very brief atrial fibrillation on monitor and possible reentrant tachycardia   Psoriasis     Rheumatoid arthritis ([place])           Past Surgical History:  Procedure Laterality Date   ABDOMINAL AORTOGRAM W/LOWER EXTREMITY Right 10/19/2020    Procedure: ABDOMINAL AORTOGRAM W/LOWER EXTREMITY;  Surgeon: Wellington Hampshire, MD;  Location: [place] [facility];  Service: Cardiovascular;  Laterality: Right;   FRACTURE SURGERY       LOOP RECORDER REMOVAL N/A 01/05/2022    Procedure: LOOP RECORDER REMOVAL;  Surgeon: Evans Lance, MD;  Location: [place] [facility];  Service: Cardiovascular;  Laterality: N/A;  PACEMAKER IMPLANT N/A 01/05/2022    Procedure: PACEMAKER IMPLANT;  Surgeon: Evans Lance, MD;  Location: [place] [facility];  Service: Cardiovascular;  Laterality: N/A;   PPM GENERATOR REMOVAL N/A 01/29/2022    Procedure: PPM GENERATOR REMOVAL;  Surgeon: Evans Lance, MD;  Location: [place] [facility];  Service: Cardiovascular;   Laterality: N/A;   TEE WITHOUT CARDIOVERSION N/A 01/29/2022    Procedure: TRANSESOPHAGEAL ECHOCARDIOGRAM (TEE);  Surgeon: Geralynn Rile, MD;  Location: [place];  Service: Cardiovascular;  Laterality: N/A;   TRANSMETATARSAL AMPUTATION Right 01/18/2021    Procedure: TRANSMETATARSAL AMPUTATION;  Surgeon: Wylene Simmer, MD;  Location: [place];  Service: Orthopedics;  Laterality: Right;         Family History  Problem Relation Age of Onset   Heart failure Father     Heart attack Father     Diabetes Father     Heart attack Mother     Heart failure Mother     Diabetes Mother     Stroke Sister      Social History:  reports that she has never smoked. She has never used smokeless tobacco. She reports that she does not drink alcohol and does not use drugs. Allergies:       Allergies  Allergen Reactions   Cholestyramine        Possible- Makes throat burn. Patient said she can't take it    Compazine [Prochlorperazine Edisylate] Swelling      REACTION: " tongue swell and unable to swallow"   Sulfonamide Derivatives Other (See Comments)      REACTION: " broke out with fine itching bumps"   Hydrocodone Other (See Comments)   Infliximab Other (See Comments)      Other reaction(s): rash   Leflunomide        Other reaction(s): diarrhea   Prochlorperazine Other (See Comments)      Other reaction(s): Unknown   Rosuvastatin        Other reaction(s): muscle aches   Sulfa Antibiotics Other (See Comments)      Other reaction(s): tongue swelling          Medications Prior to Admission  Medication Sig Dispense Refill   acetaminophen (TYLENOL) 650 MG CR tablet Take 1,300 mg by mouth every 8 (eight) hours as needed for pain.       acetaminophen-codeine (TYLENOL #3) 300-30 MG tablet Take 1 tablet by mouth in the morning, at noon, and at bedtime. (Patient taking differently: Take 1 tablet by mouth as needed for moderate pain.) 24 tablet 0   atorvastatin (LIPITOR) 20 MG tablet Take 1 tablet  (20 mg total) by mouth daily. 90 tablet 3   Calcium Carbonate-Vitamin D 600-400 MG-UNIT tablet Take 1 tablet by mouth daily at 12 noon.        carvedilol (COREG) 6.25 MG tablet TAKE 1 TABLET BY MOUTH TWICE DAILY WITH A MEAL 180 tablet 3   diclofenac Sodium (VOLTAREN) 1 % GEL Apply 2 g topically 4 (four) times daily. 350 g 0   esomeprazole (NEXIUM) 20 MG capsule Take 20 mg by mouth in the morning.       gabapentin (NEURONTIN) 100 MG capsule Take 1 capsule (100 mg total) by mouth 3 (three) times daily. 90 capsule 0   insulin aspart (NOVOLOG) 100 UNIT/ML FlexPen Inject 3 Units into the skin 3 (three) times daily with meals. Sliding scale (Patient taking differently: Inject 6-8 Units into the skin 3 (three) times daily with meals. Sliding scale)  15 mL 11   Insulin Degludec (TRESIBA [place]) Inject 6-8 Units into the skin at bedtime.       lip balm (CARMEX) ointment Apply topically as needed for lip care. 7 g 0   liver oil-zinc oxide (DESITIN) 40 % ointment Apply 1 application topically 3 (three) times daily. To raw areas in between/on buttocks 56.7 g 0   Multiple Vitamins-Minerals (CENTRUM SILVER 50+WOMEN PO) Take 1 tablet by mouth daily.       nitroGLYCERIN (NITROSTAT) 0.4 MG SL tablet Dissolve 1 tablet under the tongue every 5 minutes as needed for chest pain. Max of 3 doses, then 911. 25 tablet 6   Nutritional Supplements (ENSURE ACTIVE) LIQD Take 237 mLs by mouth in the morning. Prefers Chocolate       pyrithione zinc (SELSUN BLUE DRY SCALP) 1 % shampoo Apply 1 application topically once a week.       RESTASIS 0.05 % ophthalmic emulsion Place 1 drop into both eyes 2 (two) times daily.       saccharomyces boulardii (FLORASTOR) 250 MG capsule Take 250 mg by mouth 2 (two) times daily.       traMADol (ULTRAM) 50 MG tablet Take 50 mg by mouth 2 (two) times daily as needed for moderate pain or severe pain.              Home: Home Living Family/patient expects to be discharged to:: Private  residence Living Arrangements: Alone Available Help at Discharge: Family, Available 24 hours/day (daughter can arrange assist as needed) Type of Home: House Home Access: Stairs to enter, Other (comment) Entrance Stairs-Number of Steps: 2 Entrance Stairs-Rails: Left Home Layout: Laundry or work area in basement, Able to live on main level with bedroom/bathroom Bathroom Shower/Tub: Multimedia programmer: Handicapped height Bathroom Accessibility: Yes Home Equipment: Conservation officer, nature (2 wheels), Shower seat Additional Comments: entry 01/25/21: pt had walk-in shower and handicapped height toilet  Lives With: Alone   Functional History: Prior Function Prior Level of Function : Needs assist Mobility Comments: Mod I using RW ADLs Comments: Daughter helps with medication management and IADLs   Functional Status:  Mobility: Bed Mobility Overal bed mobility: Needs Assistance Bed Mobility: Supine to Sit Supine to sit: Min guard, HOB elevated General bed mobility comments: EOB with daughter Transfers Overall transfer level: Needs assistance Equipment used: Rolling walker (2 wheels) Transfers: Sit to/from Stand, Bed to chair/wheelchair/BSC Sit to Stand: Min assist Bed to/from chair/wheelchair/BSC transfer type:: Step pivot Step pivot transfers: Min assist General transfer comment: eob to bsc then able to ambulate around bed to recliner Ambulation/Gait Ambulation/Gait assistance: Min assist Gait Distance (Feet): 12 Feet Assistive device: Rolling walker (2 wheels) Gait Pattern/deviations: Step-through pattern, Decreased stride length, Trunk flexed, Decreased dorsiflexion - left, Decreased dorsiflexion - right General Gait Details: cues for upright posture, increasing foot clearance. light assist to steady and guide RW around the room. Gait velocity: decr Gait velocity interpretation: <1.31 ft/sec, indicative of household ambulator   ADL: ADL Overall ADL's : Needs  assistance/impaired Eating/Feeding: Set up Grooming: Minimal assistance Upper Body Bathing: Minimal assistance Lower Body Bathing: Moderate assistance Upper Body Dressing : Minimal assistance Lower Body Dressing: Moderate assistance Toilet Transfer: Minimal assistance, Stand-pivot, BSC/3in1 Toilet Transfer Details (indicate cue type and reason): stand pivot eob to 3n1 Toileting- Clothing Manipulation and Hygiene: Minimal assistance, Sit to/from stand Tub/ Shower Transfer: Minimal assistance Functional mobility during ADLs: Minimal assistance, Rolling walker (2 wheels)   Cognition: Cognition Overall Cognitive Status: Within Functional Limits for  tasks assessed Orientation Level: Oriented X4 Cognition Arousal/Alertness: Awake/alert Behavior During Therapy: Flat affect Overall Cognitive Status: Within Functional Limits for tasks assessed Area of Impairment: Orientation, Memory, Following commands, Problem solving Orientation Level: Disoriented to, Situation Memory: Decreased short-term memory Following Commands: Follows one step commands with increased time Problem Solving: Requires verbal cues, Requires tactile cues, Slow processing General Comments: Pt shocked to learn she has shingles, stating "I do?!" when PT mentioned it. Pt states she is not remembering things, pt's daughter present and has to remind her of previous hospitalizations, events during hospitalizations, etc.    Physical Exam: There were no vitals taken for this visit. Physical Exam Constitutional:      General: She is not in acute distress. HENT:     Head: Normocephalic and atraumatic.  Eyes:     Extraocular Movements: Extraocular movements intact.     Pupils: Pupils are equal, round, and reactive to light.  Cardiovascular:     Rate and Rhythm: Normal rate and regular rhythm.  Pulmonary:     Effort: Pulmonary effort is normal.     Breath sounds: Normal breath sounds.  Musculoskeletal:     Left lower leg: No  edema.     Comments: Well healed right TMA  Skin:    General: Skin is warm and dry.     Findings: Rash present.     Comments: Herpetic rash covered with foam border dressing  Neurological:     General: No focal deficit present.     Mental Status: She is alert and oriented to person, place, and time. Diffusely weak- 4/5 strength throughout. Follows commands with increased time Psychiatric:        Mood and Affect: Mood normal.        Behavior: Behavior normal.    Results for orders placed or performed during the hospital encounter of 01/24/22 (from the past 48 hour(s))  Glucose, capillary     Status: Abnormal   Collection Time: 01/31/22  9:05 PM  Result Value Ref Range   Glucose-Capillary 156 (H) 70 - 99 mg/dL    Comment: Glucose reference range applies only to samples taken after fasting for at least 8 hours.  Glucose, capillary     Status: Abnormal   Collection Time: 02/01/22  3:40 AM  Result Value Ref Range   Glucose-Capillary 113 (H) 70 - 99 mg/dL    Comment: Glucose reference range applies only to samples taken after fasting for at least 8 hours.   Comment 1 Notify RN    Comment 2 Document in Chart   Glucose, capillary     Status: Abnormal   Collection Time: 02/01/22  8:33 AM  Result Value Ref Range   Glucose-Capillary 132 (H) 70 - 99 mg/dL    Comment: Glucose reference range applies only to samples taken after fasting for at least 8 hours.   Comment 1 Notify RN    Comment 2 Document in Chart   Basic metabolic panel     Status: Abnormal   Collection Time: 02/01/22 12:25 PM  Result Value Ref Range   Sodium 141 135 - 145 mmol/L   Potassium 4.2 3.5 - 5.1 mmol/L   Chloride 103 98 - 111 mmol/L   CO2 27 22 - 32 mmol/L   Glucose, Bld 214 (H) 70 - 99 mg/dL    Comment: Glucose reference range applies only to samples taken after fasting for at least 8 hours.   BUN 23 8 - 23 mg/dL   Creatinine, Ser 1.23 (  H) 0.44 - 1.00 mg/dL   Calcium 7.7 (L) 8.9 - 10.3 mg/dL   GFR, Estimated 43  (L) >60 mL/min    Comment: (NOTE) Calculated using the CKD-EPI Creatinine Equation (2021)    Anion gap 11 5 - 15    Comment: Performed at [facility], [place] [address] [place], [place] [address]  CBC     Status: Abnormal   Collection Time: 02/01/22 12:25 PM  Result Value Ref Range   WBC 8.8 4.0 - 10.5 K/uL   RBC 3.04 (L) 3.87 - 5.11 MIL/uL   Hemoglobin 8.9 (L) 12.0 - 15.0 g/dL   HCT 27.8 (L) 36.0 - 46.0 %   MCV 91.4 80.0 - 100.0 fL   MCH 29.3 26.0 - 34.0 pg   MCHC 32.0 30.0 - 36.0 g/dL   RDW 15.5 11.5 - 15.5 %   Platelets 215 150 - 400 K/uL   nRBC 0.0 0.0 - 0.2 %    Comment: Performed at [facility], [place] [address] [place], [place] [address]  Glucose, capillary     Status: Abnormal   Collection Time: 02/01/22  1:38 PM  Result Value Ref Range   Glucose-Capillary 189 (H) 70 - 99 mg/dL    Comment: Glucose reference range applies only to samples taken after fasting for at least 8 hours.   Comment 1 Notify RN    Comment 2 Document in Chart   Glucose, capillary     Status: Abnormal   Collection Time: 02/01/22  5:15 PM  Result Value Ref Range   Glucose-Capillary 252 (H) 70 - 99 mg/dL    Comment: Glucose reference range applies only to samples taken after fasting for at least 8 hours.   Comment 1 Notify RN    Comment 2 Document in Chart   Glucose, capillary     Status: Abnormal   Collection Time: 02/01/22  7:59 PM  Result Value Ref Range   Glucose-Capillary 139 (H) 70 - 99 mg/dL    Comment: Glucose reference range applies only to samples taken after fasting for at least 8 hours.   Comment 1 Notify RN    Comment 2 Document in Chart   Glucose, capillary     Status: None   Collection Time: 02/02/22 12:25 AM  Result Value Ref Range   Glucose-Capillary 77 70 - 99 mg/dL    Comment: Glucose reference range applies only to samples taken after fasting for at least 8 hours.  Comprehensive metabolic panel     Status: Abnormal   Collection Time: 02/02/22  2:30 AM  Result  Value Ref Range   Sodium 138 135 - 145 mmol/L   Potassium 4.4 3.5 - 5.1 mmol/L   Chloride 100 98 - 111 mmol/L   CO2 27 22 - 32 mmol/L   Glucose, Bld 106 (H) 70 - 99 mg/dL    Comment: Glucose reference range applies only to samples taken after fasting for at least 8 hours.   BUN 21 8 - 23 mg/dL   Creatinine, Ser 1.34 (H) 0.44 - 1.00 mg/dL   Calcium 8.0 (L) 8.9 - 10.3 mg/dL   Total Protein 5.9 (L) 6.5 - 8.1 g/dL   Albumin 2.6 (L) 3.5 - 5.0 g/dL   AST 22 15 - 41 U/L   ALT 6 0 - 44 U/L   Alkaline Phosphatase 57 38 - 126 U/L   Total Bilirubin 0.5 0.3 - 1.2 mg/dL   GFR, Estimated 39 (L) >60 mL/min  Comment: (NOTE) Calculated using the CKD-EPI Creatinine Equation (2021)    Anion gap 11 5 - 15    Comment: Performed at [facility], [place] [address] [place], [place] [address]  CBC     Status: Abnormal   Collection Time: 02/02/22  2:30 AM  Result Value Ref Range   WBC 12.1 (H) 4.0 - 10.5 K/uL   RBC 3.48 (L) 3.87 - 5.11 MIL/uL   Hemoglobin 10.4 (L) 12.0 - 15.0 g/dL   HCT 31.6 (L) 36.0 - 46.0 %   MCV 90.8 80.0 - 100.0 fL   MCH 29.9 26.0 - 34.0 pg   MCHC 32.9 30.0 - 36.0 g/dL   RDW 15.5 11.5 - 15.5 %   Platelets 254 150 - 400 K/uL   nRBC 0.0 0.0 - 0.2 %    Comment: Performed at [facility], [place] [address], [place], [place] [address]  Magnesium     Status: None   Collection Time: 02/02/22  2:30 AM  Result Value Ref Range   Magnesium 1.7 1.7 - 2.4 mg/dL    Comment: Performed at [facility] [address], [place], [place] [address]  Glucose, capillary     Status: None   Collection Time: 02/02/22  8:34 AM  Result Value Ref Range   Glucose-Capillary 95 70 - 99 mg/dL    Comment: Glucose reference range applies only to samples taken after fasting for at least 8 hours.   Comment 1 Notify RN    Comment 2 Document in Chart   Glucose, capillary     Status: Abnormal   Collection Time: 02/02/22 12:48 PM  Result Value Ref Range   Glucose-Capillary 212 (H) 70 - 99 mg/dL     Comment: Glucose reference range applies only to samples taken after fasting for at least 8 hours.   Comment 1 Notify RN    Comment 2 Document in Chart   Glucose, capillary     Status: Abnormal   Collection Time: 02/02/22  3:47 PM  Result Value Ref Range   Glucose-Capillary 148 (H) 70 - 99 mg/dL    Comment: Glucose reference range applies only to samples taken after fasting for at least 8 hours.   MR BRAIN WO CONTRAST  Result Date: 02/01/2022 CLINICAL DATA:  Syncope EXAM: MRI HEAD WITHOUT CONTRAST TECHNIQUE: Multiplanar, multiecho pulse sequences of the brain and surrounding structures were obtained without intravenous contrast. COMPARISON:  None Available. FINDINGS: Brain: No acute infarct, mass effect or extra-axial collection. No acute or chronic hemorrhage. There is multifocal hyperintense T2-weighted signal within the white matter. Generalized volume loss. The midline structures are normal. Vascular: Major flow voids are preserved. Skull and upper cervical spine: Normal calvarium and skull base. Visualized upper cervical spine and soft tissues are normal. Sinuses/Orbits:No paranasal sinus fluid levels or advanced mucosal thickening. No mastoid or middle ear effusion. Normal orbits. IMPRESSION: 1. No acute intracranial abnormality. 2. Findings of chronic small vessel ischemia and volume loss. Electronically Signed   By: Ulyses Jarred M.D.   On: 02/01/2022 23:23   DG Abd Portable 1V  Result Date: 02/01/2022 CLINICAL DATA:  Nausea and vomiting EXAM: PORTABLE ABDOMEN - 1 VIEW COMPARISON:  01/25/2022 FINDINGS: The bowel gas pattern is normal. No radio-opaque calculi or other significant radiographic abnormality are seen. Degenerative lumbar spondylosis. Atherosclerotic vascular calcifications. IMPRESSION: Negative. Electronically Signed   By: Davina Poke D.O.   On: 02/01/2022 12:34      There were no vitals taken  for this visit.  Medical Problem List and Plan: 1. Functional deficits  secondary to debility secondary to sepsis.   -patient may shower  -ELOS/Goals: 8-12 days S  Admit to CIR 2.  Antithrombotics: -DVT/anticoagulation:  Pharmaceutical: Lovenox>> start 30 mg daily  -antiplatelet therapy: None 3. Pain Management: Scheduled Tylenol '650mg'$  for back pain.  Continue Lidoderm patches.  Aquathermia pad. 4. Mood/Behavior/Sleep: LCSW to evaluate and provide emotional support  -antipsychotic agents: n/a 5. Neuropsych/cognition: This patient is capable of making decisions on her own behalf. 6. Skin/Wound Care: Routine skin care checks 7. Fluids/Electrolytes/Nutrition: Strict I's and O's and follow-up chemistries  -Ensure with meals  -change to carb modified diet  8: Paroxysmal atrial fibrillation: s/p ILR on 06/2021; no AC   9: Sinus node dysfunction s/p [employer] pacemaker 01/05/2022; Dr. Beckie Salts  -subsequent generator removal 11/27  -continue Pacerone 200 mg daily   10: Diastolic dysfunction: Daily weight, monitor electrolytes  -continue Lasix 20 mg daily  11: Diabetes mellitus: CBGs q AC and q HS; carb modified diet  -continue SSI (may be able to dc when on CM diet)  -at home on Tresiba 6-8 units at bedtime, Novolog 3 units with meals  12: Gastroesophageal reflux disease: continue Protonix  13: Hyperlipidemia: follow-up LFTs/restart Lipitor 20 mg daily at discharge  14: Rheumatoid arthritis: continue prednisone 5 mg daily   15: Paroxysmal atrial fibrillation: s/p ILR on 06/2021; no AC   -Zio patch placed today  16: Chronic kidney disease: follow-up BMP  17: Hypertension: monitor TID and prn  -continue Norvasc 10 mg daily  -continue Lasix 20 mg daily  18: Nausea: will schedule Zofran prior to antibiotic administration  19: Periostitis of lumbar spine without osteomyelitis/MSSA bacteremia:  -Cefazolin 2 grams twice daily through 12/6  20: History of C. difficile colitis: No evidence of recurrent infection  -continue oral vancomycin 125 mg BID while  on cefazolin and 1 week thereafter  -continue Florastor 21: Zoster, left posterior hip: continue valacylovir 1000 mg daily 22. Nausea: schedule Zofran prior to antibiotics. If this does not help, discussed trying scopolamine patch  I have personally performed a face to face diagnostic evaluation, including, but not limited to relevant history and physical exam findings, of this patient and developed relevant assessment and plan.  Additionally, I have reviewed and concur with the physician assistant's documentation above.  Katharine Look PA-C  Izora Ribas, MD 02/02/2022

## 2022-02-02 NOTE — TOC Transition Note (Signed)
Transition of Care (TOC) - CM/SW Discharge Note Marvetta Gibbons RN, BSN Transitions of Care Unit 4E- RN Case Manager See Treatment Team for direct phone #   Patient Details  Name: Shelby Walker MRN: [id number] Date of Birth: Jul 15, 1933  Transition of Care [facility]) CM/SW Contact:  Dawayne Patricia, RN Phone Number: 02/02/2022, 1:25 PM   Clinical Narrative:    Noted pt has bed for INPT rehab today, MD has cleared pt for transition to [facility] rehab and pt agreeable.   Pt will transfer to [facility] INPT rehab later this afternoon.    Final next level of care: IP Rehab Facility Barriers to Discharge: Barriers Resolved   Patient Goals and CMS Choice Patient states their goals for this hospitalization and ongoing recovery are:: will go to [facility] rehab prior to return home CMS Medicare.gov Compare Post Acute Care list provided to:: Patient Choice offered to / list presented to : Patient, Adult Children (Daughter, [name])  Discharge Placement                 [facility] INPT rehab      Discharge Plan and Services   Discharge Planning Services: CM Consult Post Acute Care Choice: IP Rehab                               Social Determinants of Health (SDOH) Interventions     Readmission Risk Interventions    02/02/2022    1:25 PM  Readmission Risk Prevention Plan  HRI or [place] Complete  Social Work Consult for [place] Planning/Counseling Complete  Palliative Care Screening Not Applicable  Medication Review [employer] photographer) Complete

## 2022-02-02 NOTE — Care Management Important Message (Signed)
Important Message  Patient Details  Name: Shelby Walker MRN: [id number] Date of Birth: February 26, 1934   Medicare Important Message Given:  Yes     Shelda Altes 02/02/2022, 11:12 AM

## 2022-02-02 NOTE — Plan of Care (Signed)
  Problem: Education: Goal: Knowledge of cardiac device and self-care will improve Outcome: Progressing Goal: Ability to safely manage health related needs after discharge will improve Outcome: Progressing Goal: Individualized Educational Video(s) Outcome: Progressing

## 2022-02-02 NOTE — Progress Notes (Signed)
Occupational Therapy Treatment Patient Details Name: Shelby Walker MRN: [id number] DOB: 08-19-1933 Today's Date: 02/02/2022   History of present illness 88 year old female with chest pains took nitroglycerin prior to going to sleep and difficult to rouse the next morning, brought to ED 11/22 and found to have fever of 103, admitted to ICU for septic shock due to staph bacteria. S/p pacemaker removal 11/27. PMH: CAD, diastolic function, G8ZM, HLD, P A-fib, PAD, CKD, RA and pacemaker, s/p transmetatarsal amputation 11/22   OT comments  Pt. Seen for skilled OT.  Agreeable to participation stating that oob may help her back pain.  Min a for all mobility and adls.  Cues for rw management and for sequencing for safe descent to recliner.  Multiple attempts at pillow positioning to provide comfort for her back.  Cont. To progress adl tolerance next session as able.  Agree with current d/c recommendations.     Recommendations for follow up therapy are one component of a multi-disciplinary discharge planning process, led by the attending physician.  Recommendations may be updated based on patient status, additional functional criteria and insurance authorization.    Follow Up Recommendations  Acute inpatient rehab (3hours/day)     Assistance Recommended at Discharge Frequent or constant Supervision/Assistance  Patient can return home with the following  A little help with walking and/or transfers;A little help with bathing/dressing/bathroom;Assistance with cooking/housework;Direct supervision/assist for medications management;Direct supervision/assist for financial management;Assist for transportation;Help with stairs or ramp for entrance   Equipment Recommendations  BSC/3in1    Recommendations for Other Services PT consult;Rehab consult    Precautions / Restrictions Precautions Precautions: Fall Precaution Comments: watch vitals       Mobility Bed Mobility Overal bed mobility: Needs  Assistance Bed Mobility: Supine to Sit     Supine to sit: Min guard, HOB elevated          Transfers Overall transfer level: Needs assistance Equipment used: Rolling walker (2 wheels) Transfers: Sit to/from Stand, Bed to chair/wheelchair/BSC Sit to Stand: Min assist     Step pivot transfers: Min assist     General transfer comment: eob to bsc then able to ambulate around bed to recliner     Balance                                           ADL either performed or assessed with clinical judgement   ADL Overall ADL's : Needs assistance/impaired                         Toilet Transfer: Minimal assistance;Stand-pivot;BSC/3in1 Armed forces technical officer Details (indicate cue type and reason): stand pivot eob to 3n1 Toileting- Clothing Manipulation and Hygiene: Minimal assistance;Sit to/from stand       Functional mobility during ADLs: Minimal assistance;Rolling walker (2 wheels)      Extremity/Trunk Assessment              Vision       Perception     Praxis      Cognition Arousal/Alertness: Awake/alert Behavior During Therapy: Flat affect Overall Cognitive Status: Within Functional Limits for tasks assessed                                          Exercises  Shoulder Instructions       General Comments      Pertinent Vitals/ Pain       Pain Assessment Pain Assessment: Faces Faces Pain Scale: Hurts even more Pain Location: back Pain Descriptors / Indicators: Discomfort, Grimacing Pain Intervention(s): Limited activity within patient's tolerance, Monitored during session, Repositioned  Home Living                                          Prior Functioning/Environment              Frequency  Min 2X/week        Progress Toward Goals  OT Goals(current goals can now be found in the care plan section)  Progress towards OT goals: Progressing toward goals     Plan Discharge  plan remains appropriate    Co-evaluation                 AM-PAC OT "6 Clicks" Daily Activity     Outcome Measure   Help from another person eating meals?: A Little Help from another person taking care of personal grooming?: A Little Help from another person toileting, which includes using toliet, bedpan, or urinal?: A Little Help from another person bathing (including washing, rinsing, drying)?: A Lot Help from another person to put on and taking off regular upper body clothing?: A Little Help from another person to put on and taking off regular lower body clothing?: A Lot 6 Click Score: 16    End of Session Equipment Utilized During Treatment: Rolling walker (2 wheels)  OT Visit Diagnosis: Unsteadiness on feet (R26.81);Other abnormalities of gait and mobility (R26.89);Muscle weakness (generalized) (M62.81)   Activity Tolerance Patient limited by pain   Patient Left in chair;with call bell/phone within reach   Nurse Communication          Time: 1040-1100 OT Time Calculation (min): 20 min  Charges: OT General Charges $OT Visit: 1 Visit OT Treatments $Self Care/Home Management : 8-22 mins  Sonia Baller, COTA/L Acute Rehabilitation [phone]   Clearnce Sorrel Lorraine-COTA/L 02/02/2022, 11:09 AM

## 2022-02-02 NOTE — Progress Notes (Addendum)
Nutrition Follow-up  DOCUMENTATION CODES:   Not applicable  INTERVENTION:  Liberalize diet to regular to remove restrictions and encourage oral intake.  Will discontinue Boost Plus per pt/family request.  Provide Ensure Enlive po TID between meals, each supplement provides 350 kcal and 20 grams of protein.  Provide Magic cup TID with meals, each supplement provides 290 kcal and 9 grams of protein.  Continue Rena-vite po QHS.  After discussing with patient and daughter, pt would not want a Cortrak tube placed for enteral nutrition even though oral intake has been poor this admission. Pt would like to focus on increasing intake at meals and trying supplements (Ensure/Magic Cup).  NUTRITION DIAGNOSIS:   Inadequate oral intake related to decreased appetite as evidenced by per patient/family report.  Ongoing.  GOAL:   Patient will meet greater than or equal to 90% of their needs  Not met at this time.  MONITOR:   PO intake, Supplement acceptance, Labs, I & O's  REASON FOR ASSESSMENT:   Malnutrition Screening Tool    ASSESSMENT:   88 y.o. female presented to the ED after difficult to arouse and fever. PMH includes GERD, T2DM, recurrent C. Diff, CKD IIIb, and PAD. Pt admitted with septic shock due to pyelonephritis and AKI on CKD. Later found to have MSSA bacteremia.  11/27: s/p PPM explant 11/28: Pt found to have shingles and placed on airborne/contact precautions. 11/29: MRI of lumbar and thoracic spine showed possible early discitis but no significant abscesses.  Met with patient at bedside this morning. She reports she has a poor appetite and has not been eating well at meals. Pt documented to be eating 0-25% of meals. She reports she has been drinking some of the Boost Plus supplements, possibly 0.5-1 bottle daily. She reports overall not feeling well. She endorses back pain and nausea. Noted she was started on scheduled meclizine yesterday and scheduled Zofran today.    Per patient's request returned later in the day to meet with her again when daughter Shelby Walker present. Discussed strategies to help increase oral intake. Plan is to liberalize diet to regular, change from Boost Plus to Ensure Enlive TID, and provide Magic Cup with meal trays. No flavor preferences for supplements. Daughter reports pt started to eat better on rehab last year with liberalized diet. Discussed that pt has not had poor PO intake for over 1 weeks. Pt had NGT to suction earlier this admission and pulled it out herself as it was uncomfortable. Discussed with patient and daughter and they report pt would not want a Cortrak tube placed for enteral nutrition. Pt would like to focus on eating at meals and trying supplements.  Admission wt was 51 kg. Weights have fluctuated this admission, possibly related to fluid status. Currently documented to be 52.9 kg, which is overall wt stable.  UOP: 2050 mL or 1.6 mL/kg/hr + 1 occurrence unmeasured UOP  I/O: -5353.6 mL since admission  Medications reviewed and include: Lasix 20 mg daily, Novolog 0-9 units Q4hrs, Boost Plus po BID, meclizine 12.5 mg BID, Rena-vite po QHS, Zofran 4 mg Q8hrs IV, pantoprazole, prednisone, Florastor capsule 250 mg BID, vancomycin, cefazolin.  Labs reviewed: CBG 77-252, Creatinine 1.34  Updated MD via secure chat. Discussed with Rehab MD who was coming in to assess patient as RD was leaving. Patient will transfer to inpatient rehab today.  Diet Order:   Diet Order             Diet Heart Room service appropriate? Yes; Fluid  consistency: Thin  Diet effective now                  EDUCATION NEEDS:   No education needs have been identified at this time  Skin:  Skin Assessment: Reviewed RN Assessment  Last BM:  11/23  Height:   Ht Readings from Last 1 Encounters:  01/24/22 _0  (1.549 m)   Weight:   Wt Readings from Last 1 Encounters:  02/02/22 52.9 kg   Ideal Body Weight:  47.7 kg  BMI:  Body mass  index is 22.04 kg/m.  Estimated Nutritional Needs:   Kcal:  1600-1800  Protein:  80-95 grams  Fluid:  >/= 1.6 L  Shelby Peckman Magda Paganini, MS, RD, LDN, CNSC Pager number available on Amion

## 2022-02-02 NOTE — Progress Notes (Addendum)
Inpatient Rehabilitation Admission Medication Review by a Pharmacist  A complete drug regimen review was completed for this patient to identify any potential clinically significant medication issues.  High Risk Drug Classes Is patient taking? Indication by Medication  Antipsychotic No   Anticoagulant Yes Lovenox - DVT px  Antibiotic Yes Cefazolin - MSSA bacteremia until 12/6 then PO to complete 6 wks of abx Vancomycin - c. Diff (Continue prophy po vanc until 1 week off systemic abx) Magic mouth wash - thrush  Opioid No   Antiplatelet No   Hypoglycemics/insulin Yes SSI - DM  Vasoactive Medication Yes Amlodipine - HTN  Chemotherapy No   Other Yes Lidopaine patch - pain Robaxin - spasms Furosemide - CHF Florastor - probiotic support Prednisone - rheumatoid arthritis Protonix - GERD Valtrex - zoster Amio - AF     Type of Medication Issue Identified Description of Issue Recommendation(s)  Drug Interaction(s) (clinically significant)     Duplicate Therapy     Allergy     No Medication Administration End Date     Incorrect Dose     Additional Drug Therapy Needed  Clarify which post parental abx to complete 6 wks Message team to clarify with ID  Significant med changes from prior encounter (inform family/care partners about these prior to discharge).    Other  Tylenol with codeine, lipitor  Restart at discharge    Clinically significant medication issues were identified that warrant physician communication and completion of prescribed/recommended actions by midnight of the next day:  Yes  Name of provider notified for urgent issues identified: message covering team in AM  Provider Method of Notification: Secure chat    Pharmacist comments:   Time spent performing this drug regimen review (minutes):  [place], PharmD, [employer], AAHIVP, CPP Infectious Disease Pharmacist 02/02/2022 7:14 PM

## 2022-02-02 NOTE — Discharge Summary (Addendum)
Physician Discharge Summary  MISSEY HASLEY IHW:[id number] DOB: 04/16/1933 DOA: 01/24/2022  PCP: Reynold Bowen, MD  Admit date: 01/24/2022 Discharge date: 02/02/2022  Admitted From: home Disposition:  CIR  Recommendations for Outpatient Follow-up:  Follow up with PCP in 1-2 weeks  Home Health: none Equipment/Devices: none  Discharge Condition: stable CODE STATUS: DNR  HPI: Per admitting MD, 88 year old female with extensive past medical history well-documented below who presents with being seen last night complaining of chest pain taken nitroglycerin going to sleep. This morning she is extremely difficult to arouse and she is brought to the emergency room found to have a fever of 103 and is felt to be septic. She required fluid resuscitation initiation of antimicrobial therapy. She has a history of severe C. difficile colitis therefore C. difficile was ordered prophylactically. Pulmonary critical care called to bedside for hypoxia and hypotension. On examination her sats were 100% her blood pressure was 127/86 and she was lucid and following commands. Physical exam was unremarkable. But due to her frail nature and complexity of her care she will be admitted to intensive care unit for further evaluation and treatment.   Hospital Course / Discharge diagnoses: Principal Problem:   Severe sepsis with septic shock ([place]) Active Problems:   AKI (acute kidney injury) ([place])   MSSA bacteremia   Presence of cardiac pacemaker   Acute midline low back pain without sciatica   Pacemaker infection ([place])   Periostitis of lumbar spine without osteomyelitis ([place])   Principal problem Septic shock due to MSSA bacteremia - poa.  She recently had a PPM placed on 11/3 for long sinus pauses, EP consulted and this is now been removed.  She was started on amiodarone, current rhythm is stable without events.  Infectious disease consulted, and with clear surveillance cultures she had a PICC line placed, ID  recommends cefazolin for a week followed by several weeks of linezolid.  2D echo did not show any endocarditis.  An MRI of the lumbar and thoracic spine showed perhaps early discitis but no significant abscesses.   Active problems  Transient pleuritic chest pain 11/26 -Resolved, she had a VQ scan few days ago with was unremarkable, monitor clinically  Nausea, vomiting -over the past week, abdominal x-ray unremarkable, MRI of the brain unremarkable.  This is likely mild side effect from antibiotics.  Continue supportive care AKI on CKD2 -baseline creat around 1.2, peaked at 2.6. In the setting of septic shock, now improving, Hypokalemia -Replaced Elevated troponin, history of CAD and carotid artery disease -Medical management was recommended as outpatient in the setting of advanced age. Echo with preserved EF, no wall motion abnormalities. Coronary CTA 10/23 noted moderate (50-69%) mixed plaque in the left main and LAD.Can not rule out severe (>70%) splenosis in OM1. Avoid BB w/ recent long sinus pauses and allergic to statin. PCCM discussed with cards 11/23, felt to be demand related in the setting of septic shock, echo with preserved EF, no WMA, no role for Anticoagulation/LHC with DIC, AKI, supportive care recommended, unclear why troponins were checked in the ICU Thrombocytopenia, DIC -In the setting of septic shock, bacteremia,. Antibiotics as noted above, iproving Urinary retention -Noted in ICU, catheter was discontinued Paroxysmal atrial fibrillation - Amiodarone 200 Mg twice daily X 5 days followed by 200 Mg daily to start on Monday 12/4, until follow-up, family would like to hold off on anticoagulation at this time with frequent falls Chronic diastolic CHF -Oral Lasix today echo with preserved EF Rheumatoid arthritis steroid-dependent -  Dr Amil Amen -she was started on stress dose steroids in the ICU, resumed prednisone, on 4 Mg daily at baseline H/o recurrent Cdiff -now on Abx as above, ID  recommends twice daily oral vancomycin for the duration of her MSSA antibiotics +1 extra week. Denies diarrhea at this time, continue floraster Chronic anemia - hgb 8-9gm%. At baseline, monitor  Chronic pain from RA-At baseline on gabapentin, tramadol, Tylenol 3. Continue tramadol, gabapentin at lower dose Diabetes mellitus with complications -continue insulin as below   Discharge Instructions   Allergies as of 02/02/2022       Reactions   Cholestyramine    Possible- Makes throat burn. Patient said she can't take it    Compazine [prochlorperazine Edisylate] Swelling   REACTION: " tongue swell and unable to swallow"   Sulfonamide Derivatives Other (See Comments)   REACTION: " broke out with fine itching bumps"   Hydrocodone Other (See Comments)   Infliximab Other (See Comments)   Other reaction(s): rash   Leflunomide    Other reaction(s): diarrhea   Prochlorperazine Other (See Comments)   Other reaction(s): Unknown   Rosuvastatin    Other reaction(s): muscle aches   Sulfa Antibiotics Other (See Comments)   Other reaction(s): tongue swelling        Medication List     STOP taking these medications    carvedilol 6.25 MG tablet Commonly known as: COREG   esomeprazole 20 MG capsule Commonly known as: NEXIUM       TAKE these medications    acetaminophen 650 MG CR tablet Commonly known as: TYLENOL Take 1,300 mg by mouth every 8 (eight) hours as needed for pain.   acetaminophen-codeine 300-30 MG tablet Commonly known as: TYLENOL #3 Take 1 tablet by mouth in the morning, at noon, and at bedtime. What changed:  when to take this reasons to take this   amiodarone 200 MG tablet Commonly known as: PACERONE Take 1 tablet (200 mg total) by mouth 2 (two) times daily for 3 days, THEN 1 tablet (200 mg total) daily. Start taking on: February 02, 2022   amLODipine 10 MG tablet Commonly known as: NORVASC Take 1 tablet (10 mg total) by mouth daily. Start taking on:  February 05, 2022   atorvastatin 20 MG tablet Commonly known as: LIPITOR Take 1 tablet (20 mg total) by mouth daily.   Calcium Carbonate-Vitamin D 600-400 MG-UNIT tablet Take 1 tablet by mouth daily at 12 noon.   ceFAZolin 2-4 GM/100ML-% IVPB Commonly known as: ANCEF Inject 100 mLs (2 g total) into the vein 2 (two) times daily.   CENTRUM SILVER 50+WOMEN PO Take 1 tablet by mouth daily.   diclofenac Sodium 1 % Gel Commonly known as: VOLTAREN Apply 2 g topically 4 (four) times daily.   Ensure Active Liqd Take 237 mLs by mouth in the morning. Prefers Chocolate   furosemide 20 MG tablet Commonly known as: LASIX Take 1 tablet (20 mg total) by mouth daily.   gabapentin 100 MG capsule Commonly known as: NEURONTIN Take 1 capsule (100 mg total) by mouth 3 (three) times daily.   insulin aspart 100 UNIT/ML FlexPen Commonly known as: NOVOLOG Inject 3 Units into the skin 3 (three) times daily with meals. Sliding scale What changed: how much to take   lidocaine 5 % Commonly known as: LIDODERM Place 1 patch onto the skin daily. Remove & Discard patch within 12 hours or as directed by MD Start taking on: February 03, 2022   lip balm ointment Apply  topically as needed for lip care.   liver oil-zinc oxide 40 % ointment Commonly known as: DESITIN Apply 1 application topically 3 (three) times daily. To raw areas in between/on buttocks   nitroGLYCERIN 0.4 MG SL tablet Commonly known as: NITROSTAT Dissolve 1 tablet under the tongue every 5 minutes as needed for chest pain. Max of 3 doses, then 911.   pantoprazole 40 MG tablet Commonly known as: PROTONIX Take 1 tablet (40 mg total) by mouth daily.   predniSONE 5 MG tablet Commonly known as: DELTASONE Take 1 tablet (5 mg total) by mouth daily with breakfast.   Restasis 0.05 % ophthalmic emulsion Generic drug: cycloSPORINE Place 1 drop into both eyes 2 (two) times daily.   saccharomyces boulardii 250 MG capsule Commonly known  as: FLORASTOR Take 250 mg by mouth 2 (two) times daily.   Selsun Blue Dry Scalp 1 % shampoo Generic drug: pyrithione zinc Apply 1 application topically once a week.   traMADol 50 MG tablet Commonly known as: ULTRAM Take 50 mg by mouth 2 (two) times daily as needed for moderate pain or severe pain.   TRESIBA [place] Inject 6-8 Units into the skin at bedtime.   valACYclovir 1000 MG tablet Commonly known as: VALTREX Take 1 tablet (1,000 mg total) by mouth daily.   vancomycin 125 MG capsule Commonly known as: VANCOCIN Take 1 capsule (125 mg total) by mouth 2 (two) times daily.         Follow-up [facility] Follow up.   Why: on 12/11 at 1445 for post extraction follow up Contact information: [place] [address] [phone]                Consultations: ID Cardiology   Procedures/Studies:  MR BRAIN WO CONTRAST  Result Date: 02/01/2022 CLINICAL DATA:  Syncope EXAM: MRI HEAD WITHOUT CONTRAST TECHNIQUE: Multiplanar, multiecho pulse sequences of the brain and surrounding structures were obtained without intravenous contrast. COMPARISON:  None Available. FINDINGS: Brain: No acute infarct, mass effect or extra-axial collection. No acute or chronic hemorrhage. There is multifocal hyperintense T2-weighted signal within the white matter. Generalized volume loss. The midline structures are normal. Vascular: Major flow voids are preserved. Skull and upper cervical spine: Normal calvarium and skull base. Visualized upper cervical spine and soft tissues are normal. Sinuses/Orbits:No paranasal sinus fluid levels or advanced mucosal thickening. No mastoid or middle ear effusion. Normal orbits. IMPRESSION: 1. No acute intracranial abnormality. 2. Findings of chronic small vessel ischemia and volume loss. Electronically Signed   By: Ulyses Jarred M.D.   On: 02/01/2022 23:23   DG  Abd Portable 1V  Result Date: 02/01/2022 CLINICAL DATA:  Nausea and vomiting EXAM: PORTABLE ABDOMEN - 1 VIEW COMPARISON:  01/25/2022 FINDINGS: The bowel gas pattern is normal. No radio-opaque calculi or other significant radiographic abnormality are seen. Degenerative lumbar spondylosis. Atherosclerotic vascular calcifications. IMPRESSION: Negative. Electronically Signed   By: Davina Poke D.O.   On: 02/01/2022 12:34   MR THORACIC SPINE WO CONTRAST  Result Date: 01/31/2022 CLINICAL DATA:  Mid back pain. Recurrent C difficile colitis and MSSA bacteremia. Concern for infection. History of rheumatoid arthritis. EXAM: MRI THORACIC AND LUMBAR SPINE WITHOUT CONTRAST TECHNIQUE: Multiplanar and multiecho pulse sequences of the thoracic and lumbar spine were obtained without intravenous contrast. COMPARISON:  CTs of the chest, abdomen and pelvis 01/24/2022. Lumbar MRI 11/23/2020. FINDINGS: MRI THORACIC SPINE FINDINGS Alignment: Mild thoracic  scoliosis. There is straightening of the usual cervical lordosis on the localizing images with a minimal anterolisthesis at C3-4 and a minimal retrolisthesis at C6-7. Vertebrae: No acute or suspicious osseous findings. No evidence of discitis or osteomyelitis. Chronic endplate degenerative changes in the lower cervical spine and scattered throughout the thoracic spine, most notable at T2-3 and T5-6. Cord:  The thoracic cord appears normal in signal and caliber. Paraspinal and other soft tissues: No paraspinal inflammatory changes are identified. There are small bilateral pleural effusions. Disc levels: The thoracic spinal canal is widely patent. There are mild degenerative changes throughout the thoracic spine with small disc protrusions and endplate osteophytes. No large disc herniation, spinal stenosis or nerve root encroachment identified. Endplate osteophytes are most pronounced on the right at T11-12. In the cervical spine, there is greater spondylosis which contributes  to partial effacement of the CSF surrounding the cord from C4-5 through C6-7. MRI LUMBAR SPINE FINDINGS Segmentation: There are 5 lumbar type vertebral bodies. Alignment: Convex right lumbar scoliosis. Stable degenerative grade 1 anterolisthesis at the L3-4, L4-5 and L5-S1 levels. Vertebrae: No evidence of acute fracture, pars defect or osteomyelitis. There is chronic degenerative disc disease at L3-4 with loss of disc height and endplate degenerative changes. Compared with the previous MRI, there is mildly increased T2 signal within the disc which is asymmetric to the left. This is favored to be degenerative, although early discitis is difficult to exclude, and there is a small amount of edema within the adjacent left psoas muscle. Mild sacroiliac degenerative changes bilaterally without evidence of acute infection. Conus medullaris: Extends to the L1-2 level and appears normal. Paraspinal and other soft tissues: As above, mild edema within the left psoas muscle, best seen on the sagittal inversion recovery images. No focal fluid collections are identified. The paraspinal soft tissues otherwise appear normal. There are small renal cysts bilaterally which do not require any specific imaging follow-up. Disc levels: As above, there are prominent anterior osteophytes in the lower thoracic spine. L1-2: Normal interspace. L2-3: Stable mild disc bulging, endplate osteophytes and facet hypertrophy. No significant spinal stenosis or nerve root encroachment. L3-4: Chronic loss of disc height with annular disc bulging and endplate osteophytes. As above, new discal T2 hyperintensity which is asymmetric to the left. There is chronic multifactorial spinal stenosis which appears moderate. Left greater than right lateral recess narrowing has slightly improved from the previous study. Moderate left foraminal narrowing appears unchanged. L4-5: Severe multifactorial spinal stenosis secondary to advanced facet and ligamentous  hypertrophy, the resulting grade 1 anterolisthesis, annular disc bulging and uncovering. Moderate foraminal narrowing bilaterally appears unchanged. L5-S1: Similar appearance of chronic degenerative disc disease with loss of disc height, annular disc bulging and endplate osteophytes. Mild bilateral facet hypertrophy. Unchanged asymmetric narrowing of the right lateral recess and mild foraminal narrowing bilaterally. IMPRESSION: 1. Compared with previous lumbar MRI of 11/23/2020, there is new discal T2 hyperintensity at L3-4 which is favored to be degenerative, although early discitis is difficult to exclude. There is a small amount of edema within the adjacent left psoas muscle. No other paraspinal inflammatory changes or focal fluid collections identified. No specific evidence of osteomyelitis. If spinal infection remains a clinical concern, suggest short-term follow-up of the lumbar spine without and with contrast. 2. No evidence of discitis or osteomyelitis in the thoracic spine. 3. Multilevel spondylosis, similar to previous MRI. There is chronic multifactorial spinal stenosis at L3-4 (moderate) and L4-5 (severe). Additional details above. Electronically Signed   By: Richardean Sale  M.D.   On: 01/31/2022 13:22   MR LUMBAR SPINE WO CONTRAST  Result Date: 01/31/2022 CLINICAL DATA:  Mid back pain. Recurrent C difficile colitis and MSSA bacteremia. Concern for infection. History of rheumatoid arthritis. EXAM: MRI THORACIC AND LUMBAR SPINE WITHOUT CONTRAST TECHNIQUE: Multiplanar and multiecho pulse sequences of the thoracic and lumbar spine were obtained without intravenous contrast. COMPARISON:  CTs of the chest, abdomen and pelvis 01/24/2022. Lumbar MRI 11/23/2020. FINDINGS: MRI THORACIC SPINE FINDINGS Alignment: Mild thoracic scoliosis. There is straightening of the usual cervical lordosis on the localizing images with a minimal anterolisthesis at C3-4 and a minimal retrolisthesis at C6-7. Vertebrae: No acute  or suspicious osseous findings. No evidence of discitis or osteomyelitis. Chronic endplate degenerative changes in the lower cervical spine and scattered throughout the thoracic spine, most notable at T2-3 and T5-6. Cord:  The thoracic cord appears normal in signal and caliber. Paraspinal and other soft tissues: No paraspinal inflammatory changes are identified. There are small bilateral pleural effusions. Disc levels: The thoracic spinal canal is widely patent. There are mild degenerative changes throughout the thoracic spine with small disc protrusions and endplate osteophytes. No large disc herniation, spinal stenosis or nerve root encroachment identified. Endplate osteophytes are most pronounced on the right at T11-12. In the cervical spine, there is greater spondylosis which contributes to partial effacement of the CSF surrounding the cord from C4-5 through C6-7. MRI LUMBAR SPINE FINDINGS Segmentation: There are 5 lumbar type vertebral bodies. Alignment: Convex right lumbar scoliosis. Stable degenerative grade 1 anterolisthesis at the L3-4, L4-5 and L5-S1 levels. Vertebrae: No evidence of acute fracture, pars defect or osteomyelitis. There is chronic degenerative disc disease at L3-4 with loss of disc height and endplate degenerative changes. Compared with the previous MRI, there is mildly increased T2 signal within the disc which is asymmetric to the left. This is favored to be degenerative, although early discitis is difficult to exclude, and there is a small amount of edema within the adjacent left psoas muscle. Mild sacroiliac degenerative changes bilaterally without evidence of acute infection. Conus medullaris: Extends to the L1-2 level and appears normal. Paraspinal and other soft tissues: As above, mild edema within the left psoas muscle, best seen on the sagittal inversion recovery images. No focal fluid collections are identified. The paraspinal soft tissues otherwise appear normal. There are small  renal cysts bilaterally which do not require any specific imaging follow-up. Disc levels: As above, there are prominent anterior osteophytes in the lower thoracic spine. L1-2: Normal interspace. L2-3: Stable mild disc bulging, endplate osteophytes and facet hypertrophy. No significant spinal stenosis or nerve root encroachment. L3-4: Chronic loss of disc height with annular disc bulging and endplate osteophytes. As above, new discal T2 hyperintensity which is asymmetric to the left. There is chronic multifactorial spinal stenosis which appears moderate. Left greater than right lateral recess narrowing has slightly improved from the previous study. Moderate left foraminal narrowing appears unchanged. L4-5: Severe multifactorial spinal stenosis secondary to advanced facet and ligamentous hypertrophy, the resulting grade 1 anterolisthesis, annular disc bulging and uncovering. Moderate foraminal narrowing bilaterally appears unchanged. L5-S1: Similar appearance of chronic degenerative disc disease with loss of disc height, annular disc bulging and endplate osteophytes. Mild bilateral facet hypertrophy. Unchanged asymmetric narrowing of the right lateral recess and mild foraminal narrowing bilaterally. IMPRESSION: 1. Compared with previous lumbar MRI of 11/23/2020, there is new discal T2 hyperintensity at L3-4 which is favored to be degenerative, although early discitis is difficult to exclude. There is a small  amount of edema within the adjacent left psoas muscle. No other paraspinal inflammatory changes or focal fluid collections identified. No specific evidence of osteomyelitis. If spinal infection remains a clinical concern, suggest short-term follow-up of the lumbar spine without and with contrast. 2. No evidence of discitis or osteomyelitis in the thoracic spine. 3. Multilevel spondylosis, similar to previous MRI. There is chronic multifactorial spinal stenosis at L3-4 (moderate) and L4-5 (severe). Additional  details above. Electronically Signed   By: Richardean Sale M.D.   On: 01/31/2022 13:22   [place] EKG SITE RITE  Result Date: 01/30/2022 If Site Rite image not attached, placement could not be confirmed due to current cardiac rhythm.  ECHO TEE  Result Date: 01/29/2022    TRANSESOPHOGEAL ECHO REPORT   Patient Name:   CHEREE FOWLES Date of Exam: 01/29/2022 Medical Rec #:  [id number]   Height:       61.0 in Accession #:    [id number]  Weight:       126.5 lb Date of Birth:  [birth year]   BSA:          1.555 m Patient Age:    59 years    BP:           165/72 mmHg Patient Gender: F           HR:           82 bpm. Exam Location:  Inpatient Procedure: Transesophageal Echo, Cardiac Doppler, Color Doppler and 3D Echo Indications:     Endocarditis  History:         Patient has prior history of Echocardiogram examinations, most                  recent 01/25/2022. Pacemaker, Mitral Valve Disease; Risk                  Factors:Dyslipidemia and Diabetes.  Sonographer:     Darlina Sicilian RDCS Referring Phys:  [id number] [place] Diagnosing Phys: Eleonore Chiquito MD PROCEDURE: After discussion of the risks and benefits of a TEE, an informed consent was obtained from the patient. TEE procedure time was 14 minutes. The transesophogeal probe was passed without difficulty through the esophogus of the patient. Imaged were obtained with the patient in a left lateral decubitus position. Local oropharyngeal anesthetic was provided with Benzocaine spray. Sedation performed by different physician. The patient was monitored while under deep sedation. Anesthestetic sedation  was provided intravenously by Anesthesiology: '107mg'$  of Propofol. Image quality was excellent. The patient's vital signs; including heart rate, blood pressure, and oxygen saturation; remained stable throughout the procedure. The patient developed no complications during the procedure.  IMPRESSIONS  1. Left ventricular ejection fraction, by estimation, is 50 to 55%. The  left ventricle has low normal function.  2. Right ventricular systolic function is normal. The right ventricular size is normal. There is normal pulmonary artery systolic pressure.  3. Left atrial size was moderately dilated. No left atrial/left atrial appendage thrombus was detected.  4. The mitral valve is degenerative. Mild mitral valve regurgitation. No evidence of mitral stenosis.  5. The aortic valve is tricuspid. There is mild calcification of the aortic valve. There is mild thickening of the aortic valve. Aortic valve regurgitation is not visualized. Aortic valve sclerosis is present, with no evidence of aortic valve stenosis.  6. There is mild (Grade II) layered plaque involving the descending aorta and aortic arch. Conclusion(s)/Recommendation(s): No evidence of vegetation/infective endocarditis on this transesophageael echocardiogram. FINDINGS  Left Ventricle: Left ventricular ejection fraction, by estimation, is 50 to 55%. The left ventricle has low normal function. The left ventricular internal cavity size was normal in size. There is no left ventricular hypertrophy. Right Ventricle: The right ventricular size is normal. No increase in right ventricular wall thickness. Right ventricular systolic function is normal. There is normal pulmonary artery systolic pressure. Left Atrium: Left atrial size was moderately dilated. No left atrial/left atrial appendage thrombus was detected. Right Atrium: Right atrial size was normal in size. Pericardium: There is no evidence of pericardial effusion. Mitral Valve: The mitral valve is degenerative in appearance. There is moderate calcification of the mitral valve leaflet(s). Mildly decreased mobility of the mitral valve leaflets. Mild to moderate mitral annular calcification. Mild mitral valve regurgitation. No evidence of mitral valve stenosis. MV peak gradient, 8.2 mmHg. The mean mitral valve gradient is 3.0 mmHg with average heart rate of 70 bpm. There is no  evidence of mitral valve vegetation. Tricuspid Valve: The tricuspid valve is grossly normal. Tricuspid valve regurgitation is mild . No evidence of tricuspid stenosis. There is no evidence of tricuspid valve vegetation. Aortic Valve: The aortic valve is tricuspid. There is mild calcification of the aortic valve. There is mild thickening of the aortic valve. There is mild aortic valve annular calcification. Aortic valve regurgitation is not visualized. Aortic valve sclerosis is present, with no evidence of aortic valve stenosis. There is no evidence of aortic valve vegetation. Pulmonic Valve: The pulmonic valve was grossly normal. Pulmonic valve regurgitation is not visualized. No evidence of pulmonic stenosis. There is no evidence of pulmonic valve vegetation. Aorta: The aortic root and ascending aorta are structurally normal, with no evidence of dilitation. There is mild (Grade II) layered plaque involving the descending aorta and aortic arch. Venous: The right lower pulmonary vein, right upper pulmonary vein, left upper pulmonary vein and left lower pulmonary vein are normal. IAS/Shunts: No atrial level shunt detected by color flow Doppler. Additional Comments: A device lead is visualized in the right atrium, right ventricle and superior vena cava. Spectral Doppler performed. LEFT VENTRICLE PLAX 2D LVOT diam:     2.00 cm LVOT Area:     3.14 cm   AORTA Ao Root diam: 2.68 cm Ao Asc diam:  3.00 cm MITRAL VALVE            TRICUSPID VALVE MV Peak grad: 8.2 mmHg  TR Peak grad:   25.0 mmHg MV Mean grad: 3.0 mmHg  TR Vmax:        250.00 cm/s MV Vmax:      1.43 m/s MV Vmean:     87.0 cm/s SHUNTS                         Systemic Diam: 2.00 cm Eleonore Chiquito MD Electronically signed by Eleonore Chiquito MD Signature Date/Time: 01/29/2022/2:41:37 PM    Final    DG CHEST PORT 1 VIEW  Result Date: 01/29/2022 CLINICAL DATA:  Status post transesophageal ECHO and pacemaker removal EXAM: PORTABLE CHEST 1 VIEW COMPARISON:  Chest  radiograph dated 01/25/2022 FINDINGS: Lines/tubes: Interval removal of left IJ central venous catheter and left chest wall pacemaker. Chest: Persistent bilateral interstitial and bibasilar hazy opacities and dense left retrocardiac opacity. Pleura: Similar small bilateral pleural effusions. New vertically oriented curvilinear interface traversing the lateral left lung is favored to represent a skin fold. No definite pneumothorax. Heart/mediastinum: Similar cardiomediastinal silhouette. w Aortic atherosclerosis. Bones: Old right lateral seventh rib  fracture. IMPRESSION: 1. Interval removal of left IJ central venous catheter and left chest wall pacemaker. 2. Persistent left retrocardiac atelectasis and mild interstitial edema. 3. Similar small bilateral pleural effusions. 4. Vertically oriented curvilinear interface traversing the lateral left lung is favored to represent a skin fold. No definite pneumothorax. Attention on follow-up. Electronically Signed   By: Darrin Nipper M.D.   On: 01/29/2022 13:25   EP PPM/ICD IMPLANT  Result Date: 01/29/2022 Conclusion: Successful removal of a previously implanted dual-chamber pacemaker in a patient with a history of sinus node dysfunction and who was found to have staph sepsis.  There was no evidence of infection on transesophageal echo or in the pocket but guideline directed therapy strongly suggest removal of the device which was carried out without complication. Cristopher Peru, MD   ECHOCARDIOGRAM COMPLETE  Result Date: 01/25/2022    ECHOCARDIOGRAM REPORT   Patient Name:   MAREN WIESEN Date of Exam: 01/25/2022 Medical Rec #:  [id number]   Height:       61.0 in Accession #:    [id number]  Weight:       118.2 lb Date of Birth:  24-Jul-1933   BSA:          1.510 m Patient Age:    89 years    BP:           127/60 mmHg Patient Gender: F           HR:           78 bpm. Exam Location:  Inpatient Procedure: 2D Echo, Cardiac Doppler and Color Doppler                                  MODIFIED REPORT:     This report was modified by Rudean Haskell MD on 01/25/2022 due to                  Clarifed device lead; discussed with primary.  Indications:     Elevated troponin  History:         Patient has prior history of Echocardiogram examinations, most                  recent 01/05/2022. Pacemaker, PAD, Mitral Valve Disease; Risk                  Factors:Dyslipidemia. CKD.  Sonographer:     Clayton Lefort RDCS (AE) Referring Phys:  Brand Males Diagnosing Phys: Rudean Haskell MD IMPRESSIONS  1. Left ventricular ejection fraction, by estimation, is 50 to 55%. The left ventricle has low normal function. The left ventricle has no regional wall motion abnormalities. Left ventricular diastolic parameters are consistent with Grade II diastolic dysfunction (pseudonormalization).  2. Right ventricular systolic function is normal. The right ventricular size is normal. There is normal pulmonary artery systolic pressure. The estimated right ventricular systolic pressure is 13.2 mmHg.  3. Left atrial size was moderately dilated.  4. The mitral valve is degenerative. Mild mitral valve regurgitation. The mean mitral valve gradient is 4.0 mmHg with average heart rate of 77 bpm.  5. The aortic valve is tricuspid. There is mild calcification of the aortic valve. There is mild thickening of the aortic valve. Aortic valve regurgitation is not visualized. Aortic valve sclerosis is present, with no evidence of aortic valve stenosis.  6. The inferior vena cava is dilated in size with <50% respiratory variability, suggesting  right atrial pressure of 15 mmHg. Comparison(s): New device lead. EF is less vigorous than prior. Conclusion(s)/Recommendation(s): Discussed with primary. FINDINGS  Left Ventricle: Left ventricular ejection fraction, by estimation, is 50 to 55%. The left ventricle has low normal function. The left ventricle has no regional wall motion abnormalities. The left ventricular internal cavity size  was normal in size. There is no left ventricular hypertrophy. Left ventricular diastolic parameters are consistent with Grade II diastolic dysfunction (pseudonormalization). Right Ventricle: The right ventricular size is normal. No increase in right ventricular wall thickness. Right ventricular systolic function is normal. There is normal pulmonary artery systolic pressure. The tricuspid regurgitant velocity is 2.22 m/s, and  with an assumed right atrial pressure of 15 mmHg, the estimated right ventricular systolic pressure is 35.3 mmHg. Left Atrium: Left atrial size was moderately dilated. Right Atrium: Right atrial size was normal in size. Pericardium: Trivial pericardial effusion is present. The pericardial effusion is surrounding the apex. Mitral Valve: The mitral valve is degenerative in appearance. Mild mitral valve regurgitation. MV peak gradient, 8.8 mmHg. The mean mitral valve gradient is 4.0 mmHg with average heart rate of 77 bpm. Tricuspid Valve: The tricuspid valve is grossly normal. Tricuspid valve regurgitation is not demonstrated. No evidence of tricuspid stenosis. Aortic Valve: The aortic valve is tricuspid. There is mild calcification of the aortic valve. There is mild thickening of the aortic valve. Aortic valve regurgitation is not visualized. Aortic valve sclerosis is present, with no evidence of aortic valve stenosis. Aortic valve mean gradient measures 3.0 mmHg. Aortic valve peak gradient measures 6.0 mmHg. Aortic valve area, by VTI measures 1.47 cm. Pulmonic Valve: The pulmonic valve was not well visualized. Pulmonic valve regurgitation is not visualized. No evidence of pulmonic stenosis. Aorta: The aortic root and ascending aorta are structurally normal, with no evidence of dilitation. Venous: The inferior vena cava is dilated in size with less than 50% respiratory variability, suggesting right atrial pressure of 15 mmHg. IAS/Shunts: No atrial level shunt detected by color flow Doppler.  Additional Comments: A device lead is visualized in the right ventricle and right atrium.  LEFT VENTRICLE PLAX 2D LVIDd:         4.60 cm     Diastology LVIDs:         3.90 cm     LV e' medial:    5.33 cm/s LV PW:         1.00 cm     LV E/e' medial:  25.0 LV IVS:        0.90 cm     LV e' lateral:   7.62 cm/s LVOT diam:     1.80 cm     LV E/e' lateral: 17.5 LV SV:         41 LV SV Index:   27 LVOT Area:     2.54 cm  LV Volumes (MOD) LV vol d, MOD A2C: 61.0 ml LV vol d, MOD A4C: 69.7 ml LV vol s, MOD A2C: 32.2 ml LV vol s, MOD A4C: 39.1 ml LV SV MOD A2C:     28.8 ml LV SV MOD A4C:     69.7 ml LV SV MOD BP:      29.3 ml RIGHT VENTRICLE            IVC RV Basal diam:  2.90 cm    IVC diam: 2.50 cm RV S prime:     9.46 cm/s TAPSE (M-mode): 1.3 cm LEFT ATRIUM  Index        RIGHT ATRIUM           Index LA diam:      3.90 cm 2.58 cm/m   RA Area:     14.10 cm LA Vol (A2C): 45.9 ml 30.39 ml/m  RA Volume:   39.50 ml  26.15 ml/m LA Vol (A4C): 60.4 ml 39.99 ml/m  AORTIC VALVE AV Area (Vmax):    1.61 cm AV Area (Vmean):   1.55 cm AV Area (VTI):     1.47 cm AV Vmax:           122.00 cm/s AV Vmean:          84.200 cm/s AV VTI:            0.278 m AV Peak Grad:      6.0 mmHg AV Mean Grad:      3.0 mmHg LVOT Vmax:         77.20 cm/s LVOT Vmean:        51.200 cm/s LVOT VTI:          0.161 m LVOT/AV VTI ratio: 0.58  AORTA Ao Root diam: 2.70 cm Ao Asc diam:  2.70 cm MITRAL VALVE                TRICUSPID VALVE MV Area (PHT): 2.81 cm     TR Peak grad:   19.7 mmHg MV Area VTI:   1.08 cm     TR Vmax:        222.00 cm/s MV Peak grad:  8.8 mmHg MV Mean grad:  4.0 mmHg     SHUNTS MV Vmax:       1.48 m/s     Systemic VTI:  0.16 m MV Vmean:      88.4 cm/s    Systemic Diam: 1.80 cm MV Decel Time: 270 msec MV E velocity: 133.00 cm/s MV A velocity: 91.20 cm/s MV E/A ratio:  1.46 Rudean Haskell MD Electronically signed by Rudean Haskell MD Signature Date/Time: 01/25/2022/1:46:07 PM    Final (Updated)    DG Abd Portable  1V  Result Date: 01/25/2022 CLINICAL DATA:  Evaluate nasogastric tube placement. EXAM: PORTABLE ABDOMEN - 1 VIEW COMPARISON:  Earlier today FINDINGS: Interval placement of a nasogastric tube. The tip and side port are both well below the level of the GE junction. The distal portion of the tube appears looped within the body of the stomach. No dilated bowel loops identified. IMPRESSION: Interval placement of nasogastric tube with tip and side port well below the level of the GE junction. Electronically Signed   By: Kerby Moors M.D.   On: 01/25/2022 13:15   DG CHEST PORT 1 VIEW  Result Date: 01/25/2022 CLINICAL DATA:  Provided history: Acute and chronic respiratory failure (acute on chronic). Shortness of breath, concern for aspiration. EXAM: PORTABLE CHEST 1 VIEW COMPARISON:  Prior chest radiographs 01/24/2022 and earlier. FINDINGS: Left chest multilead implantable cardiac device. Left IJ approach central venous catheter with tip terminating at the level of the lower SVC. Mild cardiomegaly, unchanged. Aortic atherosclerosis. Central pulmonary vascular congestion without overt pulmonary edema. Trace bilateral pleural effusions. Minimal ill-defined opacity at the left lung base has an appearance favoring atelectasis. No definite airspace consolidation. IMPRESSION: 1. Minimal ill-defined opacity at the left lung base has an appearance favoring atelectasis. No definite airspace consolidation. 2. Mild cardiomegaly. 3. Central pulmonary vascular congestion without overt pulmonary edema. 4. Trace bilateral pleural effusions. 5. Aortic Atherosclerosis (ICD10-I70.0). Electronically Signed  By: Kellie Simmering D.O.   On: 01/25/2022 11:53   DG Abd 1 View  Result Date: 01/25/2022 CLINICAL DATA:  88 year old female with vomiting. EXAM: ABDOMEN - 1 VIEW COMPARISON:  Noncontrast CT Chest, Abdomen, and Pelvis yesterday. FINDINGS: Portable AP supine view at 1008 hours. Cardiac pacemaker redemonstrated. Small bilateral  pleural effusions remain apparent. Ongoing paucity of bowel gas in the abdomen and pelvis. No significant change from the CT scout view yesterday. No pneumoperitoneum evident on these supine views. Stable visualized osseous structures. IMPRESSION: 1. Paucity of bowel gas as on the CT Abdomen and Pelvis yesterday. No strong imaging evidence of bowel obstruction. 2. Small bilateral pleural effusions. Electronically Signed   By: Genevie Ann M.D.   On: 01/25/2022 10:21   DG CHEST PORT 1 VIEW  Result Date: 01/24/2022 CLINICAL DATA:  Central line placement EXAM: PORTABLE CHEST 1 VIEW COMPARISON:  01/24/2022, 9:32 a.m. FINDINGS: Cardiomegaly. Left chest multi lead pacer. Interval placement of left neck vascular catheter, tip over the superior vena cava. Both lungs are clear. The visualized skeletal structures are unremarkable. IMPRESSION: 1. Interval placement of left neck vascular catheter, tip over the superior vena cava. No pneumothorax. 2. Cardiomegaly. No acute abnormality of the lungs. Electronically Signed   By: Delanna Ahmadi M.D.   On: 01/24/2022 18:55   NM Pulmonary Perfusion  Result Date: 01/24/2022 CLINICAL DATA:  Renal failure, diabetes mellitus, paroxysmal atrial fibrillation, hypertension, chest pain, question pulmonary embolism EXAM: NUCLEAR MEDICINE PERFUSION LUNG SCAN TECHNIQUE: Perfusion images were obtained in multiple projections after intravenous injection of radiopharmaceutical. Ventilation scans intentionally deferred if perfusion scan and chest x-ray adequate for interpretation during COVID 19 epidemic. RADIOPHARMACEUTICALS:  4.2 mCi Tc-30mMAA IV COMPARISON:  01/24/2022. FINDINGS: Normal perfusion lung scan. No perfusion defects. IMPRESSION: Normal perfusion lung scan. Electronically Signed   By: MLavonia DanaM.D.   On: 01/24/2022 14:49   CT CHEST ABDOMEN PELVIS WO CONTRAST  Result Date: 01/24/2022 CLINICAL DATA:  Sepsis, shortness of breath EXAM: CT CHEST, ABDOMEN AND PELVIS WITHOUT  CONTRAST TECHNIQUE: Multidetector CT imaging of the chest, abdomen and pelvis was performed following the standard protocol without IV contrast. RADIATION DOSE REDUCTION: This exam was performed according to the departmental dose-optimization program which includes automated exposure control, adjustment of the mA and/or kV according to patient size and/or use of iterative reconstruction technique. COMPARISON:  CT abdomen pelvis, 08/27/2018 FINDINGS: CT CHEST FINDINGS Cardiovascular: Aortic atherosclerosis. Left chest multi lead pacer. Cardiomegaly. Three-vessel coronary artery calcifications. No pericardial effusion. Mediastinum/Nodes: No enlarged mediastinal, hilar, or axillary lymph nodes. Thyroid gland, trachea, and esophagus demonstrate no significant findings. Lungs/Pleura: Small bilateral pleural effusions and associated atelectasis or consolidation. Diffuse bilateral bronchial wall thickening and interlobular septal thickening. Musculoskeletal: No chest wall abnormality. No acute osseous findings. CT ABDOMEN PELVIS FINDINGS Hepatobiliary: No solid liver abnormality is seen. Tiny gallstones or calcified sludge in the gallbladder fundus (series 3, image 66). No gallbladder wall thickening, or biliary dilatation. Pancreas: Unremarkable. No pancreatic ductal dilatation or surrounding inflammatory changes. Spleen: Normal in size without significant abnormality. Adrenals/Urinary Tract: Adrenal glands are unremarkable. Multiple simple, benign bilateral renal cortical cysts, including a hemorrhagic or proteinaceous cyst of the superior pole of the right kidney. Bilateral perinephric fat stranding. Urinary tract calculi or hydronephrosis. Bladder is unremarkable. Stomach/Bowel: Stomach is within normal limits. Appendix appears normal. No evidence of bowel wall thickening, distention, or inflammatory changes. Sigmoid diverticulosis. Vascular/Lymphatic: Aortic atherosclerosis. No enlarged abdominal or pelvic lymph  nodes. Reproductive: No mass or other  abnormality. Other: No abdominal wall hernia or abnormality. No ascites. Musculoskeletal: No acute osseous findings. IMPRESSION: 1. Bilateral perinephric fat stranding, suggesting pyelonephritis. No urinary tract calculi or hydronephrosis. Correlate with urinalysis. 2. Small bilateral pleural effusions, diffuse bilateral bronchial wall thickening, and interlobular septal thickening, consistent with pulmonary edema. 3. Cardiomegaly and coronary artery disease. 4. Tiny gallstones or calcified sludge in the gallbladder fundus. No evidence of acute cholecystitis. 5. Sigmoid diverticulosis without evidence of acute diverticulitis. Aortic Atherosclerosis (ICD10-I70.0). Electronically Signed   By: Delanna Ahmadi M.D.   On: 01/24/2022 12:53   DG Chest Port 1 View  Result Date: 01/24/2022 CLINICAL DATA:  Chest pain yesterday. Found unresponsive this morning. Now short of breath. EXAM: PORTABLE CHEST 1 VIEW COMPARISON:  Radiographs 01/06/2022 and 01/04/2022.  CT 12/13/2021. FINDINGS: 0932 hours. Left subclavian pacemaker leads appear unchanged, overlapping the right atrium and the right ventricle. The heart size and mediastinal contours are stable with aortic atherosclerosis. Unchanged mild left basilar atelectasis. No edema, confluent airspace opacity, pleural effusion or pneumothorax. An old right-sided rib fracture is noted. No acute osseous findings are evident. Telemetry leads overlie the chest. IMPRESSION: No evidence of acute cardiopulmonary process. Stable mild left basilar atelectasis. Electronically Signed   By: Richardean Sale M.D.   On: 01/24/2022 09:49   CUP PACEART INCLINIC DEVICE CHECK  Result Date: 01/18/2022 Wound check appointment. Steri-strips removed. Wound without redness or edema. Incision edges approximated, wound well healed. Normal device function. Thresholds, sensing, and impedances consistent with implant measurements. Device programmed at 3.5V/auto  capture programmed on for extra safety margin until 3 month visit. Histogram distribution appropriate for patient and level of activity. No mode switches or high ventricular rates noted. Patient educated about wound care, arm mobility, lifting restrictions. ROV in 3 months with implanting physician.Myrtie Hawk, BSN, RN  DG Chest 2 View  Result Date: 01/06/2022 CLINICAL DATA:  Pacemaker placement EXAM: CHEST - 2 VIEW COMPARISON:  01/04/2022 FINDINGS: The cardiomediastinal silhouette is unremarkable. LEFT subclavian pacemaker noted with lead tips overlying the RIGHT atrium and RIGHT ventricle. Very mild LEFT basilar atelectasis noted. There is no evidence of focal airspace disease, pulmonary edema, suspicious pulmonary nodule/mass, pleural effusion, or pneumothorax. No acute bony abnormalities are identified. Remote RIGHT rib fractures again noted. IMPRESSION: LEFT subclavian pacemaker placement without pneumothorax. Minimal LEFT basilar atelectasis. Electronically Signed   By: Margarette Canada M.D.   On: 01/06/2022 09:31   EP PPM/ICD IMPLANT  Result Date: 01/05/2022 CONCLUSIONS:  1. Successful implantation of a [employer] dual-chamber pacemaker for symptomatic bradycardia due to sinus node dysfunction.  2. No early apparent complications.       Cristopher Peru, MD 01/05/2022 8:48 PM   ECHOCARDIOGRAM COMPLETE  Result Date: 01/05/2022    ECHOCARDIOGRAM REPORT   Patient Name:   TEXANNA HILBURN Date of Exam: 01/05/2022 Medical Rec #:  [id number]   Height:       62.5 in Accession #:    [id number]  Weight:       118.0 lb Date of Birth:  [birth year]   BSA:          1.537 m Patient Age:    66 years    BP:           141/63 mmHg Patient Gender: F           HR:           65 bpm. Exam Location:  Inpatient Procedure: 2D Echo, Cardiac Doppler, Color Doppler, 3D Echo and Strain Analysis  Indications:    Heart block, Complete I44.2  History:        Patient has prior history of Echocardiogram examinations, most                 recent  07/26/2018. CAD and Angina, PAD, Mitral Valve Disease,                 Signs/Symptoms:Chest Pain, Dyspnea and Fatigue; Risk                 Factors:Diabetes, Dyslipidemia, Hypertension and Non-Smoker.  Sonographer:    Greer Pickerel Referring Phys: Mamie Levers  Sonographer Comments: Image acquisition challenging due to respiratory motion. Global longitudinal strain was attempted. IMPRESSIONS  1. Left ventricular ejection fraction, by estimation, is 60 to 65%. The left ventricle has normal function. The left ventricle has no regional wall motion abnormalities. Left ventricular diastolic parameters are consistent with Grade I diastolic dysfunction (impaired relaxation). The average left ventricular global longitudinal strain is -12.5 %. The global longitudinal strain is abnormal.  2. Right ventricular systolic function is normal. The right ventricular size is normal. Tricuspid regurgitation signal is inadequate for assessing PA pressure.  3. Left atrial size was severely dilated.  4. The mitral valve is normal in structure. Mild mitral valve regurgitation. No evidence of mitral stenosis. Moderate mitral annular calcification.  5. The aortic valve is normal in structure. Aortic valve regurgitation is trivial. Aortic valve sclerosis/calcification is present, without any evidence of aortic stenosis.  6. The inferior vena cava is normal in size with greater than 50% respiratory variability, suggesting right atrial pressure of 3 mmHg. FINDINGS  Left Ventricle: Left ventricular ejection fraction, by estimation, is 60 to 65%. The left ventricle has normal function. The left ventricle has no regional wall motion abnormalities. The average left ventricular global longitudinal strain is -12.5 %. The global longitudinal strain is abnormal. The left ventricular internal cavity size was normal in size. There is no left ventricular hypertrophy. Left ventricular diastolic parameters are consistent with Grade I diastolic dysfunction  (impaired relaxation). Right Ventricle: The right ventricular size is normal. Right ventricular systolic function is normal. Tricuspid regurgitation signal is inadequate for assessing PA pressure. The tricuspid regurgitant velocity is 2.11 m/s, and with an assumed right atrial  pressure of 8 mmHg, the estimated right ventricular systolic pressure is 64.4 mmHg. Left Atrium: Left atrial size was severely dilated. Right Atrium: Right atrial size was normal in size. Pericardium: There is no evidence of pericardial effusion. Mitral Valve: The mitral valve is normal in structure. Moderate mitral annular calcification. Mild mitral valve regurgitation. No evidence of mitral valve stenosis. Tricuspid Valve: The tricuspid valve is normal in structure. Tricuspid valve regurgitation is trivial. No evidence of tricuspid stenosis. Aortic Valve: The aortic valve is normal in structure. Aortic valve regurgitation is trivial. Aortic valve sclerosis/calcification is present, without any evidence of aortic stenosis. Pulmonic Valve: The pulmonic valve was normal in structure. Pulmonic valve regurgitation is not visualized. No evidence of pulmonic stenosis. Aorta: The aortic root is normal in size and structure. Venous: The inferior vena cava is normal in size with greater than 50% respiratory variability, suggesting right atrial pressure of 3 mmHg. IAS/Shunts: No atrial level shunt detected by color flow Doppler.  LEFT VENTRICLE PLAX 2D LVIDd:         3.60 cm   Diastology LVIDs:         2.40 cm   LV e' medial:    5.55 cm/s LV PW:  1.00 cm   LV E/e' medial:  19.1 LV IVS:        0.90 cm   LV e' lateral:   5.11 cm/s LVOT diam:     1.60 cm   LV E/e' lateral: 20.7 LV SV:         48 LV SV Index:   31        2D Longitudinal Strain LVOT Area:     2.01 cm  2D Strain GLS Avg:     -12.5 %                           3D Volume EF:                          3D EF:        50 %                          LV EDV:       94 ml                           LV ESV:       47 ml                          LV SV:        46 ml RIGHT VENTRICLE RV S prime:     14.60 cm/s TAPSE (M-mode): 1.9 cm LEFT ATRIUM             Index        RIGHT ATRIUM           Index LA diam:        4.10 cm 2.67 cm/m   RA Area:     16.20 cm LA Vol (A2C):   69.3 ml 45.09 ml/m  RA Volume:   42.00 ml  27.33 ml/m LA Vol (A4C):   72.1 ml 46.92 ml/m LA Biplane Vol: 75.3 ml 49.00 ml/m  AORTIC VALVE LVOT Vmax:   101.00 cm/s LVOT Vmean:  69.700 cm/s LVOT VTI:    0.240 m  AORTA Ao Root diam: 2.60 cm Ao Asc diam:  3.10 cm MITRAL VALVE                TRICUSPID VALVE MV Area (PHT): 2.62 cm     TR Peak grad:   17.8 mmHg MV Decel Time: 289 msec     TR Vmax:        211.00 cm/s MV E velocity: 106.00 cm/s MV A velocity: 145.00 cm/s  SHUNTS MV E/A ratio:  0.73         Systemic VTI:  0.24 m                             Systemic Diam: 1.60 cm Kirk Ruths MD Electronically signed by Kirk Ruths MD Signature Date/Time: 01/05/2022/4:33:54 PM    Final    DG Chest 2 View  Result Date: 01/04/2022 CLINICAL DATA:  Palpitation EXAM: CHEST - 2 VIEW COMPARISON:  02/04/2021 FINDINGS: Tiny right pleural effusion or pleural thickening. No focal airspace disease. Normal cardiac size. Aortic atherosclerosis. Electronic recording device over left chest. IMPRESSION: Tiny right pleural effusion or pleural thickening. Electronically Signed   By: Madie Reno.D.  On: 01/04/2022 18:09     Subjective: - no chest pain, shortness of breath, no abdominal pain, nausea or vomiting.   Discharge Exam: BP (!) 112/48   Pulse 87   Temp 98.2 F (36.8 C) (Oral)   Resp 17   Ht '5\' 1"'$  (1.549 m)   Wt 52.9 kg   SpO2 100%   BMI 22.04 kg/m   General: Pt is alert, awake, not in acute distress Cardiovascular: RRR, S1/S2 +, no rubs, no gallops Respiratory: CTA bilaterally, no wheezing, no rhonchi Abdominal: Soft, NT, ND, bowel sounds + Extremities: no edema, no cyanosis    The results of significant diagnostics from this  hospitalization (including imaging, microbiology, ancillary and laboratory) are listed below for reference.     Microbiology: Recent Results (from the past 240 hour(s))  Resp Panel by RT-PCR (Flu A&B, Covid) Anterior Nasal Swab     Status: None   Collection Time: 01/24/22  9:23 AM   Specimen: Anterior Nasal Swab  Result Value Ref Range Status   SARS Coronavirus 2 by RT PCR NEGATIVE NEGATIVE Final    Comment: (NOTE) SARS-CoV-2 target nucleic acids are NOT DETECTED.  The SARS-CoV-2 RNA is generally detectable in upper respiratory specimens during the acute phase of infection. The lowest concentration of SARS-CoV-2 viral copies this assay can detect is 138 copies/mL. A negative result does not preclude SARS-Cov-2 infection and should not be used as the sole basis for treatment or other patient management decisions. A negative result may occur with  improper specimen collection/handling, submission of specimen other than nasopharyngeal swab, presence of viral mutation(s) within the areas targeted by this assay, and inadequate number of viral copies(<138 copies/mL). A negative result must be combined with clinical observations, patient history, and epidemiological information. The expected result is Negative.  Fact Sheet for Patients:  EntrepreneurPulse.com.au  Fact Sheet for Healthcare Providers:  IncredibleEmployment.be  This test is no t yet approved or cleared by the [place] FDA and  has been authorized for detection and/or diagnosis of SARS-CoV-2 by FDA under an Emergency Use Authorization (EUA). This EUA will remain  in effect (meaning this test can be used) for the duration of the COVID-19 declaration under Section 564(b)(1) of the Act, 21 U.S.C.section 360bbb-3(b)(1), unless the authorization is terminated  or revoked sooner.       Influenza A by PCR NEGATIVE NEGATIVE Final   Influenza B by PCR NEGATIVE NEGATIVE Final    Comment:  (NOTE) The Xpert Xpress SARS-CoV-2/FLU/RSV plus assay is intended as an aid in the diagnosis of influenza from Nasopharyngeal swab specimens and should not be used as a sole basis for treatment. Nasal washings and aspirates are unacceptable for Xpert Xpress SARS-CoV-2/FLU/RSV testing.  Fact Sheet for Patients: EntrepreneurPulse.com.au  Fact Sheet for Healthcare Providers: IncredibleEmployment.be  This test is not yet approved or cleared by the [place] FDA and has been authorized for detection and/or diagnosis of SARS-CoV-2 by FDA under an Emergency Use Authorization (EUA). This EUA will remain in effect (meaning this test can be used) for the duration of the COVID-19 declaration under Section 564(b)(1) of the Act, 21 U.S.C. section 360bbb-3(b)(1), unless the authorization is terminated or revoked.  Performed at [facility], [place] [address] [place], [place] [address]   Blood Culture (routine x 2)     Status: Abnormal   Collection Time: 01/24/22  9:23 AM   Specimen: BLOOD  Result Value Ref Range Status   Specimen Description BLOOD SITE NOT SPECIFIED  Final  Special Requests   Final    BOTTLES DRAWN AEROBIC AND ANAEROBIC Blood Culture adequate volume   Culture  Setup Time   Final    GRAM POSITIVE COCCI IN PAIRS IN CLUSTERS IN BOTH AEROBIC AND ANAEROBIC BOTTLES CRITICAL RESULT CALLED TO, READ BACK BY AND VERIFIED WITH: T RUDISILL,PHARMD'@0004'$  01/25/22 Britt Performed at [facility], [place] [address], [place], [place] [address]    Culture STAPHYLOCOCCUS AUREUS (A)  Final   Report Status 01/26/2022 FINAL  Final   Organism ID, Bacteria STAPHYLOCOCCUS AUREUS  Final      Susceptibility   Staphylococcus aureus - MIC*    CIPROFLOXACIN <=0.5 SENSITIVE Sensitive     ERYTHROMYCIN <=0.25 SENSITIVE Sensitive     GENTAMICIN <=0.5 SENSITIVE Sensitive     OXACILLIN 0.5 SENSITIVE Sensitive     TETRACYCLINE <=1 SENSITIVE Sensitive      VANCOMYCIN <=0.5 SENSITIVE Sensitive     TRIMETH/SULFA <=10 SENSITIVE Sensitive     CLINDAMYCIN <=0.25 SENSITIVE Sensitive     RIFAMPIN <=0.5 SENSITIVE Sensitive     Inducible Clindamycin NEGATIVE Sensitive     * STAPHYLOCOCCUS AUREUS  Urine Culture     Status: Abnormal   Collection Time: 01/24/22  9:23 AM   Specimen: In/Out Cath Urine  Result Value Ref Range Status   Specimen Description IN/OUT CATH URINE  Final   Special Requests   Final    NONE Performed at [facility], [place] [address], [place], [place] [address]    Culture (A)  Final    40,000 COLONIES/mL ESCHERICHIA COLI 40,000 COLONIES/mL PSEUDOMONAS AERUGINOSA    Report Status 01/26/2022 FINAL  Final   Organism ID, Bacteria ESCHERICHIA COLI (A)  Final   Organism ID, Bacteria PSEUDOMONAS AERUGINOSA (A)  Final      Susceptibility   Escherichia coli - MIC*    AMPICILLIN 16 INTERMEDIATE Intermediate     CEFAZOLIN <=4 SENSITIVE Sensitive     CEFEPIME <=0.12 SENSITIVE Sensitive     CEFTRIAXONE <=0.25 SENSITIVE Sensitive     CIPROFLOXACIN <=0.25 SENSITIVE Sensitive     GENTAMICIN <=1 SENSITIVE Sensitive     IMIPENEM <=0.25 SENSITIVE Sensitive     NITROFURANTOIN 32 SENSITIVE Sensitive     TRIMETH/SULFA <=20 SENSITIVE Sensitive     AMPICILLIN/SULBACTAM 4 SENSITIVE Sensitive     PIP/TAZO <=4 SENSITIVE Sensitive     * 40,000 COLONIES/mL ESCHERICHIA COLI   Pseudomonas aeruginosa - MIC*    CEFTAZIDIME 2 SENSITIVE Sensitive     CIPROFLOXACIN 0.5 SENSITIVE Sensitive     GENTAMICIN <=1 SENSITIVE Sensitive     IMIPENEM 2 SENSITIVE Sensitive     PIP/TAZO 8 SENSITIVE Sensitive     CEFEPIME 4 SENSITIVE Sensitive     * 40,000 COLONIES/mL PSEUDOMONAS AERUGINOSA  Blood Culture ID Panel (Reflexed)     Status: Abnormal   Collection Time: 01/24/22  9:23 AM  Result Value Ref Range Status   Enterococcus faecalis NOT DETECTED NOT DETECTED Final   Enterococcus Faecium NOT DETECTED NOT DETECTED Final   Listeria monocytogenes NOT  DETECTED NOT DETECTED Final   Staphylococcus species DETECTED (A) NOT DETECTED Final    Comment: CRITICAL RESULT CALLED TO, READ BACK BY AND VERIFIED WITH: T RUDISILL,PHARMD'@0004'$  01/25/22 MK    Staphylococcus aureus (BCID) DETECTED (A) NOT DETECTED Final    Comment: CRITICAL RESULT CALLED TO, READ BACK BY AND VERIFIED WITH: T RUDISILL,PHARMD'@0004'$  01/25/22 [place]    Staphylococcus epidermidis NOT DETECTED NOT DETECTED Final   Staphylococcus lugdunensis NOT DETECTED NOT  DETECTED Final   Streptococcus species NOT DETECTED NOT DETECTED Final   Streptococcus agalactiae NOT DETECTED NOT DETECTED Final   Streptococcus pneumoniae NOT DETECTED NOT DETECTED Final   Streptococcus pyogenes NOT DETECTED NOT DETECTED Final   A.calcoaceticus-baumannii NOT DETECTED NOT DETECTED Final   Bacteroides fragilis NOT DETECTED NOT DETECTED Final   Enterobacterales NOT DETECTED NOT DETECTED Final   Enterobacter cloacae complex NOT DETECTED NOT DETECTED Final   Escherichia coli NOT DETECTED NOT DETECTED Final   Klebsiella aerogenes NOT DETECTED NOT DETECTED Final   Klebsiella oxytoca NOT DETECTED NOT DETECTED Final   Klebsiella pneumoniae NOT DETECTED NOT DETECTED Final   Proteus species NOT DETECTED NOT DETECTED Final   Salmonella species NOT DETECTED NOT DETECTED Final   Serratia marcescens NOT DETECTED NOT DETECTED Final   Haemophilus influenzae NOT DETECTED NOT DETECTED Final   Neisseria meningitidis NOT DETECTED NOT DETECTED Final   Pseudomonas aeruginosa NOT DETECTED NOT DETECTED Final   Stenotrophomonas maltophilia NOT DETECTED NOT DETECTED Final   Candida albicans NOT DETECTED NOT DETECTED Final   Candida auris NOT DETECTED NOT DETECTED Final   Candida glabrata NOT DETECTED NOT DETECTED Final   Candida krusei NOT DETECTED NOT DETECTED Final   Candida parapsilosis NOT DETECTED NOT DETECTED Final   Candida tropicalis NOT DETECTED NOT DETECTED Final   Cryptococcus neoformans/gattii NOT DETECTED NOT  DETECTED Final   Meth resistant mecA/C and MREJ NOT DETECTED NOT DETECTED Final    Comment: Performed at [facility], [address], [place], [place] [address]  Blood Culture (routine x 2)     Status: Abnormal   Collection Time: 01/24/22  9:28 AM   Specimen: BLOOD  Result Value Ref Range Status   Specimen Description BLOOD SITE NOT SPECIFIED  Final   Special Requests   Final    BOTTLES DRAWN AEROBIC AND ANAEROBIC Blood Culture results may not be optimal due to an excessive volume of blood received in culture bottles   Culture  Setup Time   Final    GRAM POSITIVE COCCI IN PAIRS IN BOTH AEROBIC AND ANAEROBIC BOTTLES CRITICAL VALUE NOTED.  VALUE IS CONSISTENT WITH PREVIOUSLY REPORTED AND CALLED VALUE.    Culture (A)  Final    STAPHYLOCOCCUS AUREUS SUSCEPTIBILITIES PERFORMED ON PREVIOUS CULTURE WITHIN THE LAST 5 DAYS. Performed at [facility], [place] [address] [place],  [address]    Report Status 01/26/2022 FINAL  Final  Gastrointestinal Panel by PCR , Stool     Status: None   Collection Time: 01/24/22 10:36 AM   Specimen: Stool  Result Value Ref Range Status   Campylobacter species NOT DETECTED NOT DETECTED Final   Plesimonas shigelloides NOT DETECTED NOT DETECTED Final   Salmonella species NOT DETECTED NOT DETECTED Final   Yersinia enterocolitica NOT DETECTED NOT DETECTED Final   Vibrio species NOT DETECTED NOT DETECTED Final   Vibrio cholerae NOT DETECTED NOT DETECTED Final   Enteroaggregative E coli (EAEC) NOT DETECTED NOT DETECTED Final   Enteropathogenic E coli (EPEC) NOT DETECTED NOT DETECTED Final   Enterotoxigenic E coli (ETEC) NOT DETECTED NOT DETECTED Final   Shiga like toxin producing E coli (STEC) NOT DETECTED NOT DETECTED Final   Shigella/Enteroinvasive E coli (EIEC) NOT DETECTED NOT DETECTED Final   Cryptosporidium NOT DETECTED NOT DETECTED Final   Cyclospora cayetanensis NOT DETECTED NOT DETECTED Final   Entamoeba histolytica NOT DETECTED NOT  DETECTED Final   Giardia lamblia NOT DETECTED NOT DETECTED Final   Adenovirus F40/41  NOT DETECTED NOT DETECTED Final   Astrovirus NOT DETECTED NOT DETECTED Final   Norovirus GI/GII NOT DETECTED NOT DETECTED Final   Rotavirus A NOT DETECTED NOT DETECTED Final   Sapovirus (I, II, IV, and V) NOT DETECTED NOT DETECTED Final    Comment: Performed at [facility] [place] [place], [place] [address]  MRSA Next Gen by PCR, Nasal     Status: None   Collection Time: 01/24/22 11:00 PM   Specimen: Nasal Mucosa; Nasal Swab  Result Value Ref Range Status   MRSA by PCR Next Gen NOT DETECTED NOT DETECTED Final    Comment: (NOTE) The GeneXpert MRSA Assay (FDA approved for NASAL specimens only), is one component of a comprehensive MRSA colonization surveillance program. It is not intended to diagnose MRSA infection nor to guide or monitor treatment for MRSA infections. Test performance is not FDA approved in patients less than 65 years old. Performed at [facility], [place] [address] [place], [place] [address]   C Difficile Quick Screen w PCR reflex     Status: None   Collection Time: 01/25/22  5:14 AM   Specimen: STOOL  Result Value Ref Range Status   C Diff antigen NEGATIVE NEGATIVE Final   C Diff toxin NEGATIVE NEGATIVE Final   C Diff interpretation No C. difficile detected.  Final    Comment: Performed at [facility], [place] [address] [place], [place] [address]  Culture, blood (Routine X 2) w Reflex to ID Panel     Status: None   Collection Time: 01/25/22  2:56 PM   Specimen: BLOOD  Result Value Ref Range Status   Specimen Description BLOOD SITE NOT SPECIFIED  Final   Special Requests   Final    BOTTLES DRAWN AEROBIC AND ANAEROBIC Blood Culture adequate volume   Culture   Final    NO GROWTH 5 DAYS Performed at [facility], [address] [place], [place] [address]    Report Status 01/30/2022 FINAL  Final  Culture, blood (Routine X 2) w Reflex to ID  Panel     Status: None   Collection Time: 01/25/22  2:56 PM   Specimen: BLOOD  Result Value Ref Range Status   Specimen Description BLOOD SITE NOT SPECIFIED  Final   Special Requests   Final    BOTTLES DRAWN AEROBIC AND ANAEROBIC Blood Culture adequate volume   Culture   Final    NO GROWTH 5 DAYS Performed at [facility], [place] [address], [place], [place] [address]    Report Status 01/30/2022 FINAL  Final     Labs: Basic Metabolic Panel: Recent Labs  Lab 01/29/22 0136 01/30/22 0127 01/31/22 1057 02/01/22 1225 02/02/22 0230  NA 143 143 142 141 138  K 3.5 4.2 3.4* 4.2 4.4  CL 105 102 101 103 100  CO2 '28 26 29 27 27  '$ GLUCOSE 192* 174* 143* 214* 106*  BUN 44* 36* 25* 23 21  CREATININE 1.47* 1.35* 1.24* 1.23* 1.34*  CALCIUM 7.5* 7.5* 7.5* 7.7* 8.0*  MG  --   --   --   --  1.7   Liver Function Tests: Recent Labs  Lab 01/27/22 0507 01/28/22 0117 01/29/22 0136 02/02/22 0230  AST 212* 113* 57* 22  ALT 116* 68* 30 6  ALKPHOS 79 68 77 57  BILITOT 0.7 0.8 0.4 0.5  PROT 5.3* 4.9* 5.3* 5.9*  ALBUMIN 2.4* 2.3* 2.5* 2.6*   CBC: Recent Labs  Lab 01/29/22 0136 01/30/22 0127 01/31/22 1057 02/01/22 1225 02/02/22 0230  WBC 11.2* 8.3 8.3 8.8 12.1*  HGB 9.4* 9.5* 9.7* 8.9* 10.4*  HCT 28.6* 29.9* 29.8* 27.8* 31.6*  MCV 89.9 92.3 91.1 91.4 90.8  PLT 133* 142* 178 215 254   CBG: Recent Labs  Lab 02/01/22 1715 02/01/22 1959 02/02/22 0025 02/02/22 0834 02/02/22 1248  GLUCAP 252* 139* 77 95 212*   Hgb A1c No results for input(s): "HGBA1C" in the last 72 hours. Lipid Profile No results for input(s): "CHOL", "HDL", "LDLCALC", "TRIG", "CHOLHDL", "LDLDIRECT" in the last 72 hours. Thyroid function studies No results for input(s): "TSH", "T4TOTAL", "T3FREE", "THYROIDAB" in the last 72 hours.  Invalid input(s): "FREET3" Urinalysis    Component Value Date/Time   COLORURINE YELLOW 01/24/2022 [place] (A) 01/24/2022 0954   LABSPEC 1.018 01/24/2022  0954   PHURINE 5.0 01/24/2022 0954   GLUCOSEU NEGATIVE 01/24/2022 0954   HGBUR NEGATIVE 01/24/2022 0954   [place] 01/24/2022 0954   KETONESUR NEGATIVE 01/24/2022 0954   PROTEINUR 30 (A) 01/24/2022 0954   NITRITE NEGATIVE 01/24/2022 0954   LEUKOCYTESUR MODERATE (A) 01/24/2022 0954    FURTHER DISCHARGE INSTRUCTIONS:   Get Medicines reviewed and adjusted: Please take all your medications with you for your next visit with your Primary MD   Laboratory/radiological data: Please request your Primary MD to go over all hospital tests and procedure/radiological results at the follow up, please ask your Primary MD to get all Hospital records sent to his/her office.   In some cases, they will be blood work, cultures and biopsy results pending at the time of your discharge. Please request that your primary care M.D. goes through all the records of your hospital data and follows up on these results.   Also Note the following: If you experience worsening of your admission symptoms, develop shortness of breath, life threatening emergency, suicidal or homicidal thoughts you must seek medical attention immediately by calling 911 or calling your MD immediately  if symptoms less severe.   You must read complete instructions/literature along with all the possible adverse reactions/side effects for all the Medicines you take and that have been prescribed to you. Take any new Medicines after you have completely understood and accpet all the possible adverse reactions/side effects.    Do not drive when taking Pain medications or sleeping medications (Benzodaizepines)   Do not take more than prescribed Pain, Sleep and Anxiety Medications. It is not advisable to combine anxiety,sleep and pain medications without talking with your primary care practitioner   Special Instructions: If you have smoked or chewed Tobacco  in the last 2 yrs please stop smoking, stop any regular Alcohol  and or any  Recreational drug use.   Wear Seat belts while driving.   Please note: You were cared for by a hospitalist during your hospital stay. Once you are discharged, your primary care physician will handle any further medical issues. Please note that NO REFILLS for any discharge medications will be authorized once you are discharged, as it is imperative that you return to your primary care physician (or establish a relationship with a primary care physician if you do not have one) for your post hospital discharge needs so that they can reassess your need for medications and monitor your lab values.  Time coordinating discharge: 40 minutes  SIGNED:  Marzetta Board, MD, PhD 02/02/2022, 2:12 PM

## 2022-02-02 NOTE — H&P (Incomplete)
Physical Medicine and Rehabilitation Admission H&P    CC: Debility secondary to septic shock due to MSSA bacteremia  HPI: Shelby Walker is a 88 year old female recently had pacemaker insertion who presented to the emergency department on 01/24/2022 after found unresponsive in her bed by family.  She was incontinent of stool and urine.  She had also vomited.  Her initial blood pressure was 60 palpable by EMS.  O2 sat was 70% on room air.  Code sepsis called.  Patient was placed on 15 L nonrebreather mask, given Rocephin, vancomycin and Flagyl.  IV fluid resuscitation.  Started on Levophed due to worsening blood pressure.  Laboratory workup as well as CT of the chest abdomen pelvis ordered.  Admitted to critical care medicine.  VQ scan negative for pulmonary embolism.  CT of abdomen pelvis without hydronephrosis but demonstrated pyelonephritis.  MRI of brain unremarkable. Urinalysis consistent with UTI.  He has a history of rheumatoid arthritis maintained on prednisone 4 mg daily.  She was given stress dose of steroids.  Medical history is significant for prior severe C. difficile colitis and C. difficile toxin ordered.  Status improved.  Disease consultation obtained 11/23.  He was started on prophylaxis oral vancomycin.  She was transferred to [facility] service on 11/24.  Electrophysiology consultation obtained that day.  Patient noted to have elevated INR and DIC was ruled out.  She underwent pacemaker explantation on 1/27.  MRI of the lumbosacral and thoracic spine was formed due to back pain.  MRI findings included L3-4 discitis/osteomyelitis and left psoas edema.  Dr. Daryll Brod recommends continuing cefazolin until 12/06 and transition to oral antibiotics to finish 6 weeks treatment.  He recommends continuing prophylaxis p.o. vancomycin until 1 week off systemic antibiotics. New zoster like rash along left lower lumbar/sacral area.  Airborne/contact isolation placed 11/29. Ongoing nausea. On regular diet. Prednisone  at home for RA continued. The patient requires inpatient physical medicine and rehabilitation evaluations and treatment secondary to dysfunction due to bacteremia, sepsis, discitis.  PMH significant for right TMA. Ambulates with walker.   Review of Systems  Constitutional:  Negative for fever.  HENT:  Positive for hearing loss. Negative for sore throat.   Eyes:  Negative for double vision.       Sees spots when upright OOB  Respiratory:  Positive for cough. Negative for shortness of breath.   Cardiovascular:  Negative for chest pain and leg swelling.  Gastrointestinal:  Positive for abdominal pain and nausea. Negative for constipation.       Lack of appetite  Genitourinary:  Negative for dysuria and urgency.  Musculoskeletal:  Positive for back pain and joint pain.  Neurological:  Negative for dizziness and headaches.  Psychiatric/Behavioral:  Negative for depression. The patient does not have insomnia.    Past Medical History:  Diagnosis Date   Bruit    Carotid Doppler showed no significant abnormality     9per patient)   C. difficile colitis 07/2018   with severe sepsis   Chest pain, unspecified    Nuclear, May, 2008, no scar or ischemia   Diabetes mellitus    Diverticulosis    Dyslipidemia    Ejection fraction    EF 55-60%, echo, February, 2011 // Echocardiogram 8/21: EF 60-65, no RWMA, Gr 1 DD, GLS -14%, normal RVSF, mild LAE, trivial MR, mild MS (mean 4 mmHg), RVSP 23.4    GERD (gastroesophageal reflux disease)    Mitral regurgitation 04/21/2009   mild,  echo, February, 2011  Osteoporosis    Palpitations    possible very brief atrial fibrillation on monitor and possible reentrant tachycardia   Psoriasis    Rheumatoid arthritis [facility])    Past Surgical History:  Procedure Laterality Date   ABDOMINAL AORTOGRAM W/LOWER EXTREMITY Right 10/19/2020   Procedure: ABDOMINAL AORTOGRAM W/LOWER EXTREMITY;  Surgeon: Wellington Hampshire, MD;  Location: [place] [facility];  Service:  Cardiovascular;  Laterality: Right;   FRACTURE SURGERY     LOOP RECORDER REMOVAL N/A 01/05/2022   Procedure: LOOP RECORDER REMOVAL;  Surgeon: Evans Lance, MD;  Location: [place] [facility];  Service: Cardiovascular;  Laterality: N/A;   PACEMAKER IMPLANT N/A 01/05/2022   Procedure: PACEMAKER IMPLANT;  Surgeon: Evans Lance, MD;  Location: [place] [facility];  Service: Cardiovascular;  Laterality: N/A;   PPM GENERATOR REMOVAL N/A 01/29/2022   Procedure: PPM GENERATOR REMOVAL;  Surgeon: Evans Lance, MD;  Location: [place] [facility];  Service: Cardiovascular;  Laterality: N/A;   TEE WITHOUT CARDIOVERSION N/A 01/29/2022   Procedure: TRANSESOPHAGEAL ECHOCARDIOGRAM (TEE);  Surgeon: Geralynn Rile, MD;  Location: [place];  Service: Cardiovascular;  Laterality: N/A;   TRANSMETATARSAL AMPUTATION Right 01/18/2021   Procedure: TRANSMETATARSAL AMPUTATION;  Surgeon: Wylene Simmer, MD;  Location: [place];  Service: Orthopedics;  Laterality: Right;   Family History  Problem Relation Age of Onset   Heart failure Father    Heart attack Father    Diabetes Father    Heart attack Mother    Heart failure Mother    Diabetes Mother    Stroke Sister    Social History:  reports that she has never smoked. She has never used smokeless tobacco. She reports that she does not drink alcohol and does not use drugs. Allergies:  Allergies  Allergen Reactions   Cholestyramine     Possible- Makes throat burn. Patient said she can't take it    Compazine [Prochlorperazine Edisylate] Swelling    REACTION: " tongue swell and unable to swallow"   Sulfonamide Derivatives Other (See Comments)    REACTION: " broke out with fine itching bumps"   Hydrocodone Other (See Comments)   Infliximab Other (See Comments)    Other reaction(s): rash   Leflunomide     Other reaction(s): diarrhea   Prochlorperazine Other (See Comments)    Other reaction(s): Unknown   Rosuvastatin     Other reaction(s): muscle aches    Sulfa Antibiotics Other (See Comments)    Other reaction(s): tongue swelling   Medications Prior to Admission  Medication Sig Dispense Refill   acetaminophen (TYLENOL) 650 MG CR tablet Take 1,300 mg by mouth every 8 (eight) hours as needed for pain.     acetaminophen-codeine (TYLENOL #3) 300-30 MG tablet Take 1 tablet by mouth in the morning, at noon, and at bedtime. (Patient taking differently: Take 1 tablet by mouth as needed for moderate pain.) 24 tablet 0   atorvastatin (LIPITOR) 20 MG tablet Take 1 tablet (20 mg total) by mouth daily. 90 tablet 3   Calcium Carbonate-Vitamin D 600-400 MG-UNIT tablet Take 1 tablet by mouth daily at 12 noon.      carvedilol (COREG) 6.25 MG tablet TAKE 1 TABLET BY MOUTH TWICE DAILY WITH A MEAL 180 tablet 3   diclofenac Sodium (VOLTAREN) 1 % GEL Apply 2 g topically 4 (four) times daily. 350 g 0   esomeprazole (NEXIUM) 20 MG capsule Take 20 mg by mouth in the morning.     gabapentin (NEURONTIN) 100 MG  capsule Take 1 capsule (100 mg total) by mouth 3 (three) times daily. 90 capsule 0   insulin aspart (NOVOLOG) 100 UNIT/ML FlexPen Inject 3 Units into the skin 3 (three) times daily with meals. Sliding scale (Patient taking differently: Inject 6-8 Units into the skin 3 (three) times daily with meals. Sliding scale) 15 mL 11   Insulin Degludec (TRESIBA [place]) Inject 6-8 Units into the skin at bedtime.     lip balm (CARMEX) ointment Apply topically as needed for lip care. 7 g 0   liver oil-zinc oxide (DESITIN) 40 % ointment Apply 1 application topically 3 (three) times daily. To raw areas in between/on buttocks 56.7 g 0   Multiple Vitamins-Minerals (CENTRUM SILVER 50+WOMEN PO) Take 1 tablet by mouth daily.     nitroGLYCERIN (NITROSTAT) 0.4 MG SL tablet Dissolve 1 tablet under the tongue every 5 minutes as needed for chest pain. Max of 3 doses, then 911. 25 tablet 6   Nutritional Supplements (ENSURE ACTIVE) LIQD Take 237 mLs by mouth in the morning. Prefers Chocolate      pyrithione zinc (SELSUN BLUE DRY SCALP) 1 % shampoo Apply 1 application topically once a week.     RESTASIS 0.05 % ophthalmic emulsion Place 1 drop into both eyes 2 (two) times daily.     saccharomyces boulardii (FLORASTOR) 250 MG capsule Take 250 mg by mouth 2 (two) times daily.     traMADol (ULTRAM) 50 MG tablet Take 50 mg by mouth 2 (two) times daily as needed for moderate pain or severe pain.        Home: Home Living Family/patient expects to be discharged to:: Private residence Living Arrangements: Alone Available Help at Discharge: Family, Available 24 hours/day (daughter can arrange assist as needed) Type of Home: House Home Access: Stairs to enter, Other (comment) Entrance Stairs-Number of Steps: 2 Entrance Stairs-Rails: Left Home Layout: Laundry or work area in basement, Able to live on main level with bedroom/bathroom Bathroom Shower/Tub: Multimedia programmer: Handicapped height Bathroom Accessibility: Yes Home Equipment: Conservation officer, nature (2 wheels), Shower seat Additional Comments: entry 01/25/21: pt had walk-in shower and handicapped height toilet  Lives With: Alone   Functional History: Prior Function Prior Level of Function : Needs assist Mobility Comments: Mod I using RW ADLs Comments: Daughter helps with medication management and IADLs  Functional Status:  Mobility: Bed Mobility Overal bed mobility: Needs Assistance Bed Mobility: Supine to Sit Supine to sit: Min guard, HOB elevated General bed mobility comments: EOB with daughter Transfers Overall transfer level: Needs assistance Equipment used: Rolling walker (2 wheels) Transfers: Sit to/from Stand, Bed to chair/wheelchair/BSC Sit to Stand: Min assist Bed to/from chair/wheelchair/BSC transfer type:: Step pivot Step pivot transfers: Min assist General transfer comment: eob to bsc then able to ambulate around bed to recliner Ambulation/Gait Ambulation/Gait assistance: Min assist Gait Distance  (Feet): 12 Feet Assistive device: Rolling walker (2 wheels) Gait Pattern/deviations: Step-through pattern, Decreased stride length, Trunk flexed, Decreased dorsiflexion - left, Decreased dorsiflexion - right General Gait Details: cues for upright posture, increasing foot clearance. light assist to steady and guide RW around the room. Gait velocity: decr Gait velocity interpretation: <1.31 ft/sec, indicative of household ambulator    ADL: ADL Overall ADL's : Needs assistance/impaired Eating/Feeding: Set up Grooming: Minimal assistance Upper Body Bathing: Minimal assistance Lower Body Bathing: Moderate assistance Upper Body Dressing : Minimal assistance Lower Body Dressing: Moderate assistance Toilet Transfer: Minimal assistance, Stand-pivot, BSC/3in1 Toilet Transfer Details (indicate cue type and reason): stand pivot eob to  3n1 Toileting- Clothing Manipulation and Hygiene: Minimal assistance, Sit to/from stand Tub/ Shower Transfer: Minimal assistance Functional mobility during ADLs: Minimal assistance, Rolling walker (2 wheels)  Cognition: Cognition Overall Cognitive Status: Within Functional Limits for tasks assessed Orientation Level: Oriented X4 Cognition Arousal/Alertness: Awake/alert Behavior During Therapy: Flat affect Overall Cognitive Status: Within Functional Limits for tasks assessed Area of Impairment: Orientation, Memory, Following commands, Problem solving Orientation Level: Disoriented to, Situation Memory: Decreased short-term memory Following Commands: Follows one step commands with increased time Problem Solving: Requires verbal cues, Requires tactile cues, Slow processing General Comments: Pt shocked to learn she has shingles, stating "I do?!" when PT mentioned it. Pt states she is not remembering things, pt's daughter present and has to remind her of previous hospitalizations, events during hospitalizations, etc.  Physical Exam: Blood pressure (!) 126/55,  pulse 82, temperature 99.6 F (37.6 C), temperature source Oral, resp. rate 14, height '5\' 1"'$  (1.549 m), weight 52.9 kg, SpO2 98 %. Physical Exam Constitutional:      General: She is not in acute distress. HENT:     Head: Normocephalic and atraumatic.  Eyes:     Extraocular Movements: Extraocular movements intact.     Pupils: Pupils are equal, round, and reactive to light.  Cardiovascular:     Rate and Rhythm: Normal rate and regular rhythm.  Pulmonary:     Effort: Pulmonary effort is normal.     Breath sounds: Normal breath sounds.  Musculoskeletal:     Left lower leg: No edema.     Comments: Well healed right TMA  Skin:    General: Skin is warm and dry.     Findings: Rash present.     Comments: Herpetic rash covered with foam border dressing  Neurological:     General: No focal deficit present.     Mental Status: She is alert and oriented to person, place, and time.  Psychiatric:        Mood and Affect: Mood normal.        Behavior: Behavior normal.     Results for orders placed or performed during the hospital encounter of 01/24/22 (from the past 48 hour(s))  Glucose, capillary     Status: Abnormal   Collection Time: 01/31/22  1:52 PM  Result Value Ref Range   Glucose-Capillary 158 (H) 70 - 99 mg/dL    Comment: Glucose reference range applies only to samples taken after fasting for at least 8 hours.  Glucose, capillary     Status: Abnormal   Collection Time: 01/31/22  4:26 PM  Result Value Ref Range   Glucose-Capillary 214 (H) 70 - 99 mg/dL    Comment: Glucose reference range applies only to samples taken after fasting for at least 8 hours.  Glucose, capillary     Status: Abnormal   Collection Time: 01/31/22  9:05 PM  Result Value Ref Range   Glucose-Capillary 156 (H) 70 - 99 mg/dL    Comment: Glucose reference range applies only to samples taken after fasting for at least 8 hours.  Glucose, capillary     Status: Abnormal   Collection Time: 02/01/22  3:40 AM  Result  Value Ref Range   Glucose-Capillary 113 (H) 70 - 99 mg/dL    Comment: Glucose reference range applies only to samples taken after fasting for at least 8 hours.   Comment 1 Notify RN    Comment 2 Document in Chart   Glucose, capillary     Status: Abnormal   Collection Time: 02/01/22  8:33 AM  Result Value Ref Range   Glucose-Capillary 132 (H) 70 - 99 mg/dL    Comment: Glucose reference range applies only to samples taken after fasting for at least 8 hours.   Comment 1 Notify RN    Comment 2 Document in Chart   Basic metabolic panel     Status: Abnormal   Collection Time: 02/01/22 12:25 PM  Result Value Ref Range   Sodium 141 135 - 145 mmol/L   Potassium 4.2 3.5 - 5.1 mmol/L   Chloride 103 98 - 111 mmol/L   CO2 27 22 - 32 mmol/L   Glucose, Bld 214 (H) 70 - 99 mg/dL    Comment: Glucose reference range applies only to samples taken after fasting for at least 8 hours.   BUN 23 8 - 23 mg/dL   Creatinine, Ser 1.23 (H) 0.44 - 1.00 mg/dL   Calcium 7.7 (L) 8.9 - 10.3 mg/dL   GFR, Estimated 43 (L) >60 mL/min    Comment: (NOTE) Calculated using the CKD-EPI Creatinine Equation (2021)    Anion gap 11 5 - 15    Comment: Performed at [facility] [address], [place], [place] [address]  CBC     Status: Abnormal   Collection Time: 02/01/22 12:25 PM  Result Value Ref Range   WBC 8.8 4.0 - 10.5 K/uL   RBC 3.04 (L) 3.87 - 5.11 MIL/uL   Hemoglobin 8.9 (L) 12.0 - 15.0 g/dL   HCT 27.8 (L) 36.0 - 46.0 %   MCV 91.4 80.0 - 100.0 fL   MCH 29.3 26.0 - 34.0 pg   MCHC 32.0 30.0 - 36.0 g/dL   RDW 15.5 11.5 - 15.5 %   Platelets 215 150 - 400 K/uL   nRBC 0.0 0.0 - 0.2 %    Comment: Performed at [facility], [place] [address] [place], [place] [address]  Glucose, capillary     Status: Abnormal   Collection Time: 02/01/22  1:38 PM  Result Value Ref Range   Glucose-Capillary 189 (H) 70 - 99 mg/dL    Comment: Glucose reference range applies only to samples taken after fasting for at least 8  hours.   Comment 1 Notify RN    Comment 2 Document in Chart   Glucose, capillary     Status: Abnormal   Collection Time: 02/01/22  5:15 PM  Result Value Ref Range   Glucose-Capillary 252 (H) 70 - 99 mg/dL    Comment: Glucose reference range applies only to samples taken after fasting for at least 8 hours.   Comment 1 Notify RN    Comment 2 Document in Chart   Glucose, capillary     Status: Abnormal   Collection Time: 02/01/22  7:59 PM  Result Value Ref Range   Glucose-Capillary 139 (H) 70 - 99 mg/dL    Comment: Glucose reference range applies only to samples taken after fasting for at least 8 hours.   Comment 1 Notify RN    Comment 2 Document in Chart   Glucose, capillary     Status: None   Collection Time: 02/02/22 12:25 AM  Result Value Ref Range   Glucose-Capillary 77 70 - 99 mg/dL    Comment: Glucose reference range applies only to samples taken after fasting for at least 8 hours.  Comprehensive metabolic panel     Status: Abnormal   Collection Time: 02/02/22  2:30 AM  Result Value Ref Range   Sodium 138 135 - 145  mmol/L   Potassium 4.4 3.5 - 5.1 mmol/L   Chloride 100 98 - 111 mmol/L   CO2 27 22 - 32 mmol/L   Glucose, Bld 106 (H) 70 - 99 mg/dL    Comment: Glucose reference range applies only to samples taken after fasting for at least 8 hours.   BUN 21 8 - 23 mg/dL   Creatinine, Ser 1.34 (H) 0.44 - 1.00 mg/dL   Calcium 8.0 (L) 8.9 - 10.3 mg/dL   Total Protein 5.9 (L) 6.5 - 8.1 g/dL   Albumin 2.6 (L) 3.5 - 5.0 g/dL   AST 22 15 - 41 U/L   ALT 6 0 - 44 U/L   Alkaline Phosphatase 57 38 - 126 U/L   Total Bilirubin 0.5 0.3 - 1.2 mg/dL   GFR, Estimated 39 (L) >60 mL/min    Comment: (NOTE) Calculated using the CKD-EPI Creatinine Equation (2021)    Anion gap 11 5 - 15    Comment: Performed at [facility], [place] [address] [place], [place] [address]  CBC     Status: Abnormal   Collection Time: 02/02/22  2:30 AM  Result Value Ref Range   WBC 12.1 (H) 4.0 - 10.5 K/uL    RBC 3.48 (L) 3.87 - 5.11 MIL/uL   Hemoglobin 10.4 (L) 12.0 - 15.0 g/dL   HCT 31.6 (L) 36.0 - 46.0 %   MCV 90.8 80.0 - 100.0 fL   MCH 29.9 26.0 - 34.0 pg   MCHC 32.9 30.0 - 36.0 g/dL   RDW 15.5 11.5 - 15.5 %   Platelets 254 150 - 400 K/uL   nRBC 0.0 0.0 - 0.2 %    Comment: Performed at [facility], [place] [address] [place], [place] [address]  Magnesium     Status: None   Collection Time: 02/02/22  2:30 AM  Result Value Ref Range   Magnesium 1.7 1.7 - 2.4 mg/dL    Comment: Performed at [facility] [address], [place], [place] [address]  Glucose, capillary     Status: None   Collection Time: 02/02/22  8:34 AM  Result Value Ref Range   Glucose-Capillary 95 70 - 99 mg/dL    Comment: Glucose reference range applies only to samples taken after fasting for at least 8 hours.   Comment 1 Notify RN    Comment 2 Document in Chart   Glucose, capillary     Status: Abnormal   Collection Time: 02/02/22 12:48 PM  Result Value Ref Range   Glucose-Capillary 212 (H) 70 - 99 mg/dL    Comment: Glucose reference range applies only to samples taken after fasting for at least 8 hours.   Comment 1 Notify RN    Comment 2 Document in Chart    MR BRAIN WO CONTRAST  Result Date: 02/01/2022 CLINICAL DATA:  Syncope EXAM: MRI HEAD WITHOUT CONTRAST TECHNIQUE: Multiplanar, multiecho pulse sequences of the brain and surrounding structures were obtained without intravenous contrast. COMPARISON:  None Available. FINDINGS: Brain: No acute infarct, mass effect or extra-axial collection. No acute or chronic hemorrhage. There is multifocal hyperintense T2-weighted signal within the white matter. Generalized volume loss. The midline structures are normal. Vascular: Major flow voids are preserved. Skull and upper cervical spine: Normal calvarium and skull base. Visualized upper cervical spine and soft tissues are normal. Sinuses/Orbits:No paranasal sinus fluid levels or advanced mucosal thickening. No mastoid  or middle ear effusion. Normal orbits. IMPRESSION: 1. No acute intracranial abnormality. 2. Findings  of chronic small vessel ischemia and volume loss. Electronically Signed   By: Ulyses Jarred M.D.   On: 02/01/2022 23:23   DG Abd Portable 1V  Result Date: 02/01/2022 CLINICAL DATA:  Nausea and vomiting EXAM: PORTABLE ABDOMEN - 1 VIEW COMPARISON:  01/25/2022 FINDINGS: The bowel gas pattern is normal. No radio-opaque calculi or other significant radiographic abnormality are seen. Degenerative lumbar spondylosis. Atherosclerotic vascular calcifications. IMPRESSION: Negative. Electronically Signed   By: Davina Poke D.O.   On: 02/01/2022 12:34      Blood pressure (!) 126/55, pulse 82, temperature 99.6 F (37.6 C), temperature source Oral, resp. rate 14, height '5\' 1"'$  (1.549 m), weight 52.9 kg, SpO2 98 %.  Medical Problem List and Plan: 1. Functional deficits secondary to ***  -patient may *** shower  -ELOS/Goals: *** 2.  Antithrombotics: -DVT/anticoagulation:  Pharmaceutical: Lovenox>> start 30 mg daily  -antiplatelet therapy: None 3. Pain Management: Scheduled Tylenol for back pain.  Continue Lidoderm patches.  Aquathermia pad. 4. Mood/Behavior/Sleep: LCSW to evaluate and provide emotional support  -antipsychotic agents: n/a 5. Neuropsych/cognition: This patient is capable of making decisions on her own behalf. 6. Skin/Wound Care: Routine skin care checks 7. Fluids/Electrolytes/Nutrition: Strict I's and O's and follow-up chemistries  -Ensure with meals  -change to carb modified diet  8: Paroxysmal atrial fibrillation: s/p ILR on 06/2021; no AC   9: Sinus node dysfunction s/p Medtronic pacemaker 01/05/2022; Dr. Beckie Salts  -subsequent generator removal 11/27  -continue Pacerone 200 mg daily   10: Diastolic dysfunction: Daily weight, monitor electrolytes  -continue Lasix 20 mg daily  11: Diabetes mellitus: CBGs q AC and q HS; carb modified diet  -continue SSI (may be able to dc when  on CM diet)  -at home on Tresiba 6-8 units at bedtime, Novolog 3 units with meals  12: Gastroesophageal reflux disease: continue Protonix  13: Hyperlipidemia: follow-up LFTs/restart Lipitor 20 mg daily at discharge  14: Rheumatoid arthritis: continue prednisone 5 mg daily   15: Paroxysmal atrial fibrillation: s/p ILR on 06/2021; no AC   -Zio patch placed today  16: Chronic kidney disease: follow-up BMP  17: Hypertension: monitor TID and prn  -continue Norvasc 10 mg daily  -continue Lasix 20 mg daily  18: Nausea: will schedule Zofran prior to antibiotic administration  19: Periostitis of lumbar spine without osteomyelitis/MSSA bacteremia:  -Cefazolin 2 grams twice daily through 12/6  20: History of C. difficile colitis: No evidence of recurrent infection  -continue oral vancomycin 125 mg BID while on cefazolin and 1 week thereafter  -continue Florastor 21: Zoster, left posterior hip: continue valacylovir 1000 mg daily ***  Barbie Banner, PA-C 02/02/2022

## 2022-02-03 DIAGNOSIS — A4189 Other specified sepsis: Secondary | ICD-10-CM | POA: Diagnosis not present

## 2022-02-03 DIAGNOSIS — E119 Type 2 diabetes mellitus without complications: Secondary | ICD-10-CM | POA: Diagnosis not present

## 2022-02-03 DIAGNOSIS — E440 Moderate protein-calorie malnutrition: Secondary | ICD-10-CM

## 2022-02-03 DIAGNOSIS — R001 Bradycardia, unspecified: Secondary | ICD-10-CM | POA: Diagnosis not present

## 2022-02-03 DIAGNOSIS — I480 Paroxysmal atrial fibrillation: Secondary | ICD-10-CM | POA: Diagnosis not present

## 2022-02-03 LAB — CBC WITH DIFFERENTIAL/PLATELET
Abs Immature Granulocytes: 0.02 10*3/uL (ref 0.00–0.07)
Basophils Absolute: 0 10*3/uL (ref 0.0–0.1)
Basophils Relative: 0 %
Eosinophils Absolute: 0.1 10*3/uL (ref 0.0–0.5)
Eosinophils Relative: 2 %
HCT: 28 % — ABNORMAL LOW (ref 36.0–46.0)
Hemoglobin: 8.7 g/dL — ABNORMAL LOW (ref 12.0–15.0)
Immature Granulocytes: 0 %
Lymphocytes Relative: 23 %
Lymphs Abs: 1.6 10*3/uL (ref 0.7–4.0)
MCH: 28.6 pg (ref 26.0–34.0)
MCHC: 31.1 g/dL (ref 30.0–36.0)
MCV: 92.1 fL (ref 80.0–100.0)
Monocytes Absolute: 0.6 10*3/uL (ref 0.1–1.0)
Monocytes Relative: 9 %
Neutro Abs: 4.5 10*3/uL (ref 1.7–7.7)
Neutrophils Relative %: 66 %
Platelets: 211 10*3/uL (ref 150–400)
RBC: 3.04 MIL/uL — ABNORMAL LOW (ref 3.87–5.11)
RDW: 15.3 % (ref 11.5–15.5)
WBC: 6.8 10*3/uL (ref 4.0–10.5)
nRBC: 0 % (ref 0.0–0.2)

## 2022-02-03 LAB — BASIC METABOLIC PANEL
Anion gap: 11 (ref 5–15)
BUN: 18 mg/dL (ref 8–23)
CO2: 28 mmol/L (ref 22–32)
Calcium: 8.4 mg/dL — ABNORMAL LOW (ref 8.9–10.3)
Chloride: 101 mmol/L (ref 98–111)
Creatinine, Ser: 1.36 mg/dL — ABNORMAL HIGH (ref 0.44–1.00)
GFR, Estimated: 38 mL/min — ABNORMAL LOW (ref >60)
Glucose, Bld: 108 mg/dL — ABNORMAL HIGH (ref 70–99)
Potassium: 3.8 mmol/L (ref 3.5–5.1)
Sodium: 140 mmol/L (ref 135–145)

## 2022-02-03 LAB — GLUCOSE, CAPILLARY
Glucose-Capillary: 121 mg/dL — ABNORMAL HIGH (ref 70–99)
Glucose-Capillary: 242 mg/dL — ABNORMAL HIGH (ref 70–99)
Glucose-Capillary: 175 mg/dL — ABNORMAL HIGH (ref 70–99)
Glucose-Capillary: 260 mg/dL — ABNORMAL HIGH (ref 70–99)

## 2022-02-03 MED ORDER — NAPHAZOLINE-PHENIRAMINE 0.025-0.3 % OP SOLN
1.0000 [drp] | Freq: Three times a day (TID) | OPHTHALMIC | Status: AC
  Administered 2022-02-03 – 2022-02-08 (×15): 1 [drp] via OPHTHALMIC
  Filled 2022-02-03: qty 15

## 2022-02-03 MED ORDER — MEGESTROL ACETATE 400 MG/10ML PO SUSP
400.0000 mg | Freq: Two times a day (BID) | ORAL | Status: DC
  Administered 2022-02-03 – 2022-02-15 (×25): 400 mg via ORAL
  Filled 2022-02-03 (×25): qty 10

## 2022-02-03 NOTE — Progress Notes (Signed)
Carelink Summary Report / Loop Recorder 

## 2022-02-03 NOTE — Progress Notes (Signed)
PROGRESS NOTE   Subjective/Complaints: Pt still feels tired, doesn't have energy. Eyes are dry  ROS: Patient denies fever, rash, sore throat, blurred vision, dizziness, nausea, vomiting, diarrhea, cough, shortness of breath or chest pain, joint or back/neck pain, headache, or mood change.    Objective:   MR BRAIN WO CONTRAST  Result Date: 02/01/2022 CLINICAL DATA:  Syncope EXAM: MRI HEAD WITHOUT CONTRAST TECHNIQUE: Multiplanar, multiecho pulse sequences of the brain and surrounding structures were obtained without intravenous contrast. COMPARISON:  None Available. FINDINGS: Brain: No acute infarct, mass effect or extra-axial collection. No acute or chronic hemorrhage. There is multifocal hyperintense T2-weighted signal within the white matter. Generalized volume loss. The midline structures are normal. Vascular: Major flow voids are preserved. Skull and upper cervical spine: Normal calvarium and skull base. Visualized upper cervical spine and soft tissues are normal. Sinuses/Orbits:No paranasal sinus fluid levels or advanced mucosal thickening. No mastoid or middle ear effusion. Normal orbits. IMPRESSION: 1. No acute intracranial abnormality. 2. Findings of chronic small vessel ischemia and volume loss. Electronically Signed   By: Ulyses Jarred M.D.   On: 02/01/2022 23:23   DG Abd Portable 1V  Result Date: 02/01/2022 CLINICAL DATA:  Nausea and vomiting EXAM: PORTABLE ABDOMEN - 1 VIEW COMPARISON:  01/25/2022 FINDINGS: The bowel gas pattern is normal. No radio-opaque calculi or other significant radiographic abnormality are seen. Degenerative lumbar spondylosis. Atherosclerotic vascular calcifications. IMPRESSION: Negative. Electronically Signed   By: Davina Poke D.O.   On: 02/01/2022 12:34   Recent Labs    02/02/22 0230 02/03/22 0312  WBC 12.1* 6.8  HGB 10.4* 8.7*  HCT 31.6* 28.0*  PLT 254 211   Recent Labs    02/02/22 0230  02/03/22 0312  NA 138 140  K 4.4 3.8  CL 100 101  CO2 27 28  GLUCOSE 106* 108*  BUN 21 18  CREATININE 1.34* 1.36*  CALCIUM 8.0* 8.4*    Intake/Output Summary (Last 24 hours) at 02/03/2022 1021 Last data filed at 02/03/2022 0900 Gross per 24 hour  Intake 243 ml  Output --  Net 243 ml        Physical Exam: Vital Signs Blood pressure (!) 123/54, pulse 86, temperature 98.4 F (36.9 C), temperature source Oral, resp. rate 18, SpO2 93 %.  General: Alert and oriented x 3, No apparent distress HEENT: Head is normocephalic, atraumatic, PERRLA, EOMI, sclera anicteric, oral mucosa pink and moist, dentition intact, ext ear canals clear,  Neck: Supple without JVD or lymphadenopathy Heart: Reg rate and rhythm. No murmurs rubs or gallops Chest: CTA bilaterally without wheezes, rales, or rhonchi; no distress Abdomen: Soft, non-tender, non-distended, bowel sounds positive. Extremities: No clubbing, cyanosis, or edema. Pulses are 2+ Psych: Pt's affect is appropriate. Pt is cooperative Skin: Clean and intact without signs of breakdown Neuro:  Alert and oriented x 3. Normal insight and awareness. Intact Memory. Normal language and speech. Cranial nerve exam unremarkable. Motor 4/5 UE, 3->4/5 LE prox to distal. No focal sensory findings.  Musculoskeletal: TMA well shaped. Normal ROM, fair sitting posture    Assessment/Plan: 1. Functional deficits which require 3+ hours per day of interdisciplinary therapy in a comprehensive inpatient rehab setting. Physiatrist  is providing close team supervision and 24 hour management of active medical problems listed below. Physiatrist and rehab team continue to assess barriers to discharge/monitor patient progress toward functional and medical goals  Care Tool:  Bathing              Bathing assist       Upper Body Dressing/Undressing Upper body dressing        Upper body assist      Lower Body Dressing/Undressing Lower body dressing             Lower body assist       Toileting Toileting    Toileting assist       Transfers Chair/bed transfer  Transfers assist     Chair/bed transfer assist level: Minimal Assistance - Patient > 75%     Locomotion Ambulation   Ambulation assist      Assist level: Contact Guard/Touching assist Assistive device: Walker-rolling Max distance: 125   Walk 10 feet activity   Assist     Assist level: Contact Guard/Touching assist Assistive device: Walker-rolling   Walk 50 feet activity   Assist    Assist level: Contact Guard/Touching assist Assistive device: Walker-rolling    Walk 150 feet activity   Assist Walk 150 feet activity did not occur: Safety/medical concerns (dizziness)         Walk 10 feet on uneven surface  activity   Assist Walk 10 feet on uneven surfaces activity did not occur: Safety/medical concerns (dizziness)         Wheelchair     Assist Is the patient using a wheelchair?: Yes Type of Wheelchair: Manual    Wheelchair assist level: Minimal Assistance - Patient > 75% Max wheelchair distance: 30    Wheelchair 50 feet with 2 turns activity    Assist    Wheelchair 50 feet with 2 turns activity did not occur: Safety/medical concerns (bil UE fatigue)       Wheelchair 150 feet activity     Assist  Wheelchair 150 feet activity did not occur: Safety/medical concerns (bil UE fatigue)       Blood pressure (!) 123/54, pulse 86, temperature 98.4 F (36.9 C), temperature source Oral, resp. rate 18, SpO2 93 %.  Medical Problem List and Plan: 1. Functional deficits secondary to debility secondary to sepsis.              -patient may shower             -ELOS/Goals: 8-12 days S             -Patient is beginning CIR therapies today including PT and OT  2.  Antithrombotics: -DVT/anticoagulation:  Pharmaceutical: Lovenox>> start 30 mg daily             -antiplatelet therapy: None 3. Pain Management: Scheduled  Tylenol '650mg'$  for back pain.  Continue Lidoderm patches.  Aquathermia pad. 4. Mood/Behavior/Sleep: LCSW to evaluate and provide emotional support             -antipsychotic agents: n/a 5. Neuropsych/cognition: This patient is capable of making decisions on her own behalf. 6. Skin/Wound Care: Routine skin care checks 7. Fluids/Electrolytes/Nutrition: Strict I's and O's and follow-up chemistries             -Ensure with meals             -change to carb modified diet   12/2 intake remains poor-begin trial of megace to boost appetite 8: Paroxysmal atrial fibrillation: s/p  ILR on 06/2021; no AC    9: Sinus node dysfunction s/p [employer] pacemaker 01/05/2022; Dr. Beckie Salts             -subsequent generator removal 11/27             -continue Pacerone 200 mg daily              10: Diastolic dysfunction: Daily weight, monitor electrolytes             -continue Lasix 20 mg daily   11: Diabetes mellitus: CBGs q AC and q HS; carb modified diet             -continue SSI (may be able to dc when on CM diet)             -at home on Tresiba 6-8 units at bedtime, Novolog 3 units with meals   CBG (last 3)  Recent Labs    02/02/22 1547 02/02/22 2117 02/03/22 0619  GLUCAP 148* 185* 121*    12/2 fair control so far but she's not eating much 12: Gastroesophageal reflux disease: continue Protonix   13: Hyperlipidemia: follow-up LFTs/restart Lipitor 20 mg daily at discharge   14: Rheumatoid arthritis: continue prednisone 5 mg daily   15: Paroxysmal atrial fibrillation: s/p ILR on 06/2021; no AC              -Zio patch placed today   16: Chronic kidney disease: follow-up BMP   17: Hypertension: monitor TID and prn             -continue Norvasc 10 mg daily             -continue Lasix 20 mg daily   18: Nausea: will schedule Zofran prior to antibiotic administration   19: Periostitis of lumbar spine without osteomyelitis/MSSA bacteremia:             -Cefazolin 2 grams twice daily through 12/6    20: History of C. difficile colitis: No evidence of recurrent infection             -continue oral vancomycin 125 mg BID while on cefazolin and 1 week thereafter             -continue Florastor 21: Zoster, left posterior hip: continue valacylovir 1000 mg daily   -F/U re: precautions    LOS: 1 days A FACE TO FACE EVALUATION WAS PERFORMED  Meredith Staggers 02/03/2022, 10:21 AM

## 2022-02-03 NOTE — Evaluation (Addendum)
Physical Therapy Assessment and Plan  Patient Details  Name: Shelby Walker MRN: [id number] Date of Birth: [birth year]  PT Diagnosis: Cognitive deficits, Dizziness and giddiness, Low back pain, Muscle weakness, and Pain in joint Rehab Potential: Good ELOS: 10- 12 days   Today's Date: 02/03/2022 PT Individual Time: 0905-1010 PT Individual Time Calculation (min): 65 min    Hospital Problem: Principal Problem:   Severe sepsis with acute organ dysfunction due to Gram positive bacteria [facility])   Past Medical History:  Past Medical History:  Diagnosis Date   Bruit    Carotid Doppler showed no significant abnormality     9per patient)   C. difficile colitis 07/2018   with severe sepsis   Chest pain, unspecified    Nuclear, May, 2008, no scar or ischemia   Diabetes mellitus    Diverticulosis    Dyslipidemia    Ejection fraction    EF 55-60%, echo, February, 2011 // Echocardiogram 8/21: EF 60-65, no RWMA, Gr 1 DD, GLS -14%, normal RVSF, mild LAE, trivial MR, mild MS (mean 4 mmHg), RVSP 23.4    GERD (gastroesophageal reflux disease)    Mitral regurgitation 04/21/2009   mild,  echo, February, 2011   Osteoporosis    Palpitations    possible very brief atrial fibrillation on monitor and possible reentrant tachycardia   Psoriasis    Rheumatoid arthritis ([place])    Past Surgical History:  Past Surgical History:  Procedure Laterality Date   ABDOMINAL AORTOGRAM W/LOWER EXTREMITY Right 10/19/2020   Procedure: ABDOMINAL AORTOGRAM W/LOWER EXTREMITY;  Surgeon: Wellington Hampshire, MD;  Location: [place] [facility];  Service: Cardiovascular;  Laterality: Right;   FRACTURE SURGERY     LOOP RECORDER REMOVAL N/A 01/05/2022   Procedure: LOOP RECORDER REMOVAL;  Surgeon: Evans Lance, MD;  Location: [place] [facility];  Service: Cardiovascular;  Laterality: N/A;   PACEMAKER IMPLANT N/A 01/05/2022   Procedure: PACEMAKER IMPLANT;  Surgeon: Evans Lance, MD;  Location: [place] [facility];  Service:  Cardiovascular;  Laterality: N/A;   PPM GENERATOR REMOVAL N/A 01/29/2022   Procedure: PPM GENERATOR REMOVAL;  Surgeon: Evans Lance, MD;  Location: [place] [facility];  Service: Cardiovascular;  Laterality: N/A;   TEE WITHOUT CARDIOVERSION N/A 01/29/2022   Procedure: TRANSESOPHAGEAL ECHOCARDIOGRAM (TEE);  Surgeon: Geralynn Rile, MD;  Location: [place];  Service: Cardiovascular;  Laterality: N/A;   TRANSMETATARSAL AMPUTATION Right 01/18/2021   Procedure: TRANSMETATARSAL AMPUTATION;  Surgeon: Wylene Simmer, MD;  Location: [place];  Service: Orthopedics;  Laterality: Right;    Assessment & Plan Clinical Impression: Shelby Walker is a 88 year old female recently had pacemaker insertion who presented to the emergency department on 01/24/2022 after found unresponsive in her bed by family. She was incontinent of stool and urine. She had also vomited. Her initial blood pressure was 60 palpable by EMS. O2 sat was 70% on room air. Code sepsis called. Patient was placed on 15 L nonrebreather mask, given Rocephin, vancomycin and Flagyl. IV fluid resuscitation. Started on Levophed due to worsening blood pressure. Laboratory workup as well as CT of the chest abdomen pelvis ordered. Admitted to critical care medicine. VQ scan negative for pulmonary embolism. CT of abdomen pelvis without hydronephrosis but demonstrated pyelonephritis. MRI of brain unremarkable. Urinalysis consistent with UTI. He has a history of rheumatoid arthritis maintained on prednisone 4 mg daily. She was given stress dose of steroids. Medical history is significant for prior severe C. difficile colitis and C. difficile toxin ordered. Status  improved. Disease consultation obtained 11/23. He was started on prophylaxis oral vancomycin. She was transferred to [facility] service on 11/24. Electrophysiology consultation obtained that day. Patient noted to have elevated INR and DIC was ruled out. She underwent pacemaker explantation on 1/27. MRI of the  lumbosacral and thoracic spine was formed due to back pain. MRI findings included L3-4 discitis/osteomyelitis and left psoas edema. Dr. Daryll Brod recommends continuing cefazolin until 12/06 and transition to oral antibiotics to finish 6 weeks treatment. He recommends continuing prophylaxis p.o. vancomycin until 1 week off systemic antibiotics. New zoster like rash along left lower lumbar/sacral area. Airborne/contact isolation placed 11/29. Ongoing nausea. On regular diet. Prednisone at home for RA continued. Patient transferred to CIR on 02/02/2022 .   Patient currently requires min with mobility secondary to muscle weakness and muscle joint tightness, decreased cardiorespiratoy endurance, and decreased standing balance and decreased balance strategies.  Prior to hospitalization, patient was modified independent  with mobility and lived with Alone in a House home.  Home access is 2Stairs to enter, Other (comment).  Patient will benefit from skilled PT intervention to maximize safe functional mobility, minimize fall risk, and decrease caregiver burden for planned discharge home with 24 hour supervision.  Anticipate patient will benefit from follow up [place] at discharge.  PT - End of Session Activity Tolerance: Tolerates < 10 min activity with changes in vital signs Endurance Deficit: Yes Endurance Deficit Description: dyspnea wiht wc propulsion x 30' PT Assessment Rehab Potential (ACUTE/IP ONLY): Good PT Patient demonstrates impairments in the following area(s): Balance;Pain;Endurance;Safety PT Transfers Functional Problem(s): Bed Mobility;Bed to Chair;Car;Furniture PT Locomotion Functional Problem(s): Ambulation;Wheelchair Mobility;Stairs PT Plan PT Intensity: Minimum of 1-2 x/day ,45 to 90 minutes PT Frequency: 5 out of 7 days PT Duration Estimated Length of Stay: 10- 12 days PT Treatment/Interventions: Ambulation/gait training;Cognitive remediation/compensation;Discharge planning;DME/adaptive equipment  instruction;Functional mobility training;Pain management;Splinting/orthotics;Therapeutic Activities;Psychosocial support;UE/LE Strength taining/ROM;Balance/vestibular training;Community reintegration;Neuromuscular re-education;Patient/family education;Stair training;Therapeutic Exercise;UE/LE Coordination activities;Wheelchair propulsion/positioning PT Transfers Anticipated Outcome(s): S basic and car PT Locomotion Anticipated Outcome(s): S gait x 150' with LRAD controlled and community setting, S gait x 69' with LRAD home setting; S up/down 4 steps 1 rail, PT Recommendation Recommendations for Other Services: Therapeutic Recreation consult Therapeutic Recreation Interventions: Pet therapy Follow Up Recommendations: Home health PT Patient destination: Home Equipment Details: pt owns RW   PT Evaluation Precautions/Restrictions Precautions Precautions: Fall Precaution Comments: watch vitals Restrictions Weight Bearing Restrictions: No General   Vital SignsTherapy Vitals BP: (!) 123/54 (after gait 113/52, HR 92) Patient Position (if appropriate): Sitting Pain Pain Assessment Pain Score: 10-Worst pain ever Pain Location: Back Pain Intervention(s): Emotional support;Repositioned Pain Interference Pain Interference Pain Effect on Sleep: 3. Frequently Pain Interference with Therapy Activities: 1. Rarely or not at all Pain Interference with Day-to-Day Activities: 1. Rarely or not at all Home Living/Prior [place] Available Help at Discharge: Family;Available 24 hours/day Type of Home: House Home Access: Stairs to enter;Other (comment) Entrance Stairs-Number of Steps: 2 Entrance Stairs-Rails: Left Home Layout: Laundry or work area in basement;Able to live on main level with bedroom/bathroom;One level (has a chair lift to go down to basement to complete laundry) Bathroom Shower/Tub: Walk-in shower (shower chair, grab bars with suction cups, hand held shower) Bathroom  Toilet: Handicapped height Bathroom Accessibility: Yes Additional Comments: has RW, SPC, shoe horn  Lives With: Alone Prior Function Level of Independence: Independent with basic ADLs;Independent with transfers;Independent with homemaking with ambulation;Independent with gait  Able to Take Stairs?: Yes Vocation: Retired Vision/Perception  From OT eval:  Vision - History Ability to See in Adequate Light: 1 Impaired Vision - Assessment Eye Alignment: Within Functional Limits Ocular Range of Motion: Within Functional Limits Tracking/Visual Pursuits: Able to track stimulus in all quads without difficulty Saccades: Within functional limits Convergence: Impaired (comment) Perception Perception: Impaired Praxis Praxis: Impaired  Cognition Overall Cognitive Status: Impaired/Different from baseline Arousal/Alertness: Awake/alert Orientation Level: Oriented X4 Year: 2023 Month: December Day of Week: Incorrect Attention: Focused Focused Attention: Appears intact Memory: Impaired Memory Impairment: Decreased short term memory Awareness: Appears intact Problem Solving: Appears intact Safety/Judgment: Appears intact Sensation Sensation Light Touch: Appears Intact (LEs) Hot/Cold: Appears Intact Proprioception: Appears Intact Stereognosis: Not tested Additional Comments: pt does reports new numbness to UE Coordination Gross Motor Movements are Fluid and Coordinated: Yes Fine Motor Movements are Fluid and Coordinated: No Coordination and Movement Description: pt with RA Finger Nose Finger Test: [facility] Heel Shin Test: NT Motor  Motor Motor: Other (comment) Motor - Skilled Clinical Observations: generalized weakness   Trunk/Postural Assessment  Cervical Assessment Cervical Assessment: Exceptions to [facility] (forward head) Thoracic Assessment Thoracic Assessment: Exceptions to [facility] (rounded shoulders) Lumbar Assessment Lumbar Assessment: Exceptions to [facility] (posterior pelvic  tilt) Postural Control Postural Control: Deficits on evaluation (decreased)  Balance Balance Balance Assessed: Yes Dynamic Sitting Balance Dynamic Sitting - Balance Support: Feet supported Dynamic Sitting - Level of Assistance: 5: Stand by assistance Reach (Patient is able to reach ___ inches to right, left, forward, back): 5 Dynamic Sitting - Balance Activities: Forward lean/weight shifting;Reaching for Consulting civil engineer Standing - Balance Support: Bilateral upper extremity supported Static Standing - Level of Assistance: 4: Min assist (CGA) Static Standing - Comment/# of Minutes: 30 seconds Dynamic Standing Balance Dynamic Standing - Balance Support: Bilateral upper extremity supported Dynamic Standing - Level of Assistance: 4: Min assist (CGA) Dynamic Standing - Balance Activities: Reaching for objects Extremity Assessment   RLE Assessment RLE Assessment: Within Functional Limits LLE Assessment LLE Assessment: Exceptions to [facility] (knee crepitus) General Strength Comments: grossly in sitting: 4/5 hip fleixon, adduction, abduction; , knee extesnion, ankle DF  Care Tool Care Tool Bed Mobility Roll left and right activity   Roll left and right assist level: Supervision/Verbal cueing    Sit to lying activity   Sit to lying assist level: Supervision/Verbal cueing    Lying to sitting on side of bed activity   Lying to sitting on side of bed assist level: the ability to move from lying on the back to sitting on the side of the bed with no back support.: Supervision/Verbal cueing     Care Tool Transfers Sit to stand transfer   Sit to stand assist level: Minimal Assistance - Patient > 75%    Chair/bed transfer   Chair/bed transfer assist level: Minimal Assistance - Patient > 75%     Toilet transfer   Assist Level: Minimal Assistance - Patient > 75%    Car transfer Car transfer activity did not occur:  (simulated in room) Car transfer assist level: Minimal  Assistance - Patient > 75%      Care Tool Locomotion Ambulation   Assist level: Contact Guard/Touching assist Assistive device: Walker-rolling Max distance: 125  Walk 10 feet activity   Assist level: Contact Guard/Touching assist Assistive device: Walker-rolling   Walk 50 feet with 2 turns activity   Assist level: Contact Guard/Touching assist Assistive device: Walker-rolling  Walk 150 feet activity Walk 150 feet activity did not occur: Safety/medical concerns (dizziness)      Walk 10 feet  on uneven surfaces activity Walk 10 feet on uneven surfaces activity did not occur: Safety/medical concerns (dizziness)      Stairs Stair activity did not occur:  (simulated in room wiht 1 step) Assist level: Contact Guard/Touching assist Stairs assistive device: 1 hand rail (2 hands on rail) Max number of stairs: 2  Walk up/down 1 step activity   Walk up/down 1 step (curb) assist level: Contact Guard/Touching assist Walk up/down 1 step or curb assistive device: 1 hand rail  Walk up/down 4 steps activity Walk up/down 4 steps activity did not occur: Safety/medical concerns (fatigue)      Walk up/down 12 steps activity Walk up/down 12 steps activity did not occur: Safety/medical concerns (fatigue)      Pick up small objects from floor        Wheelchair Is the patient using a wheelchair?: Yes Type of Wheelchair: Manual   Wheelchair assist level: Minimal Assistance - Patient > 75% Max wheelchair distance: 30  Wheel 50 feet with 2 turns activity Wheelchair 50 feet with 2 turns activity did not occur: mod assist (for 30' out of 50')  Wheel 150 feet activity Wheelchair 150 feet activity did not occur: max assist (for 30' out of 150')      Refer to Care Plan for Long Term Goals  SHORT TERM GOAL WEEK 1 PT Short Term Goal 1 (Week 1): = LTGs due to ELOS  Recommendations for other services: Therapeutic Recreation  Pet therapy  Skilled Therapeutic Intervention Pt received resting in bed.   Her son was present. She reported pain in back and believes that she has had medicine.  Pt had difficulty understanding this PT wearing an N95 mask.  Her son interpreted for her at times.  Pt casually mentioned seeing "bugs crawling in my bed, and flying through the air in here".  PT informed her that she was imagining this and informed Josh, Therapist, sports.  Strengthening: seated R/L long arc quad knee extensions with ankle pumps at end range, marching in place, and small excursion trunk flexion/extension. At conclusion of session, pt resting in bed with alarm set , needs at hand, and son present. Mobility Bed Mobility Bed Mobility: Supine to Sit;Sit to Supine Supine to Sit: Supervision/Verbal cueing Sit to Supine: Supervision/Verbal cueing Transfers Transfers: Sit to Stand;Stand to Sit Sit to Stand: Minimal Assistance - Patient > 75% Stand to Sit: Minimal Assistance - Patient > 75% Transfer (Assistive device): Rolling walker Locomotion  Gait Ambulation: Yes Gait Assistance: Contact Guard/Touching assist Gait Distance (Feet): 150 Feet Assistive device: Rolling walker Gait Gait: Yes Gait Pattern: Narrow base of support;Decreased stride length Stairs / Additional Locomotion Stairs: Yes Stairs Assistance: Contact Guard/Touching assist Stair Management Technique: One rail Left (2 hands on rail (footboard used as rail)) Number of Stairs: 2 Height of Stairs: 6 Wheelchair Mobility Wheelchair Mobility: Yes Wheelchair Assistance: Minimal assistance - Patient >75% Wheelchair Propulsion: Both upper extremities Wheelchair Parts Management: Needs assistance Distance: 30   Discharge Criteria: Patient will be discharged from PT if patient refuses treatment 3 consecutive times without medical reason, if treatment goals not met, if there is a change in medical status, if patient makes no progress towards goals or if patient is discharged from hospital.  The above assessment, treatment plan, treatment  alternatives and goals were discussed and mutually agreed upon: by patient and by family  Shelby Walker 02/03/2022, 12:34 PM

## 2022-02-03 NOTE — Progress Notes (Signed)
This nurse contacted infection prevention about the need for airborne precautions on this patient and the admission to rehab. This nurse assessed skin with second RN and found open shingles sores to labia and anus as well as Left buttock IP consulting with colleague to see if this patient is appropriate for rehab related to un contained shingles sores requiring negative pressure room to contain the infection. IP recommendations to remove patient from airborne precautions and keep patient on contact precautions in a brief when doing personal care to contain sores

## 2022-02-03 NOTE — Plan of Care (Signed)
Problem: RH Balance Goal: LTG: Patient will maintain dynamic sitting balance (OT) Description: LTG:  Patient will maintain dynamic sitting balance with assistance during activities of daily living (OT) Flowsheets (Taken 02/03/2022 1228) LTG: Pt will maintain dynamic sitting balance during ADLs with: Independent Goal: LTG Patient will maintain dynamic standing with ADLs (OT) Description: LTG:  Patient will maintain dynamic standing balance with assist during activities of daily living (OT)  Flowsheets (Taken 02/03/2022 1228) LTG: Pt will maintain dynamic standing balance during ADLs with: Supervision/Verbal cueing   Problem: Sit to Stand Goal: LTG:  Patient will perform sit to stand in prep for activites of daily living with assistance level (OT) Description: LTG:  Patient will perform sit to stand in prep for activites of daily living with assistance level (OT) Flowsheets (Taken 02/03/2022 1228) LTG: PT will perform sit to stand in prep for activites of daily living with assistance level: Supervision/Verbal cueing   Problem: RH Grooming Goal: LTG Patient will perform grooming w/assist,cues/equip (OT) Description: LTG: Patient will perform grooming with assist, with/without cues using equipment (OT) Flowsheets (Taken 02/03/2022 1228) LTG: Pt will perform grooming with assistance level of: Supervision/Verbal cueing   Problem: RH Bathing Goal: LTG Patient will bathe all body parts with assist levels (OT) Description: LTG: Patient will bathe all body parts with assist levels (OT) Flowsheets (Taken 02/03/2022 1228) LTG: Pt will perform bathing with assistance level/cueing: Supervision/Verbal cueing LTG: Position pt will perform bathing: Shower   Problem: RH Dressing Goal: LTG Patient will perform upper body dressing (OT) Description: LTG Patient will perform upper body dressing with assist, with/without cues (OT). Flowsheets (Taken 02/03/2022 1228) LTG: Pt will perform upper body dressing  with assistance level of: Supervision/Verbal cueing Goal: LTG Patient will perform lower body dressing w/assist (OT) Description: LTG: Patient will perform lower body dressing with assist, with/without cues in positioning using equipment (OT) Flowsheets (Taken 02/03/2022 1228) LTG: Pt will perform lower body dressing with assistance level of: Supervision/Verbal cueing   Problem: RH Toileting Goal: LTG Patient will perform toileting task (3/3 steps) with assistance level (OT) Description: LTG: Patient will perform toileting task (3/3 steps) with assistance level (OT)  Flowsheets (Taken 02/03/2022 1228) LTG: Pt will perform toileting task (3/3 steps) with assistance level: Supervision/Verbal cueing   Problem: RH Simple Meal Prep Goal: LTG Patient will perform simple meal prep w/assist (OT) Description: LTG: Patient will perform simple meal prep with assistance, with/without cues (OT). Flowsheets (Taken 02/03/2022 1228) LTG: Pt will perform simple meal prep with assistance level of: Supervision/Verbal cueing LTG: Pt will perform simple meal prep w/level of: Ambulate with device   Problem: RH Laundry Goal: LTG Patient will perform laundry w/assist, cues (OT) Description: LTG: Patient will perform laundry with assistance, with/without cues (OT). Flowsheets (Taken 02/03/2022 1228) LTG: Pt will perform laundry with assistance level of: Supervision/Verbal cueing LTG: Pt will perform laundry with level of: Ambulate with device   Problem: RH Light Housekeeping Goal: LTG Patient will perform light housekeeping w/assist (OT) Description: LTG: Patient will perform light housekeeping with assistance, with/without cues (OT). Flowsheets (Taken 02/03/2022 1228) LTG: Pt will perform light housekeeping with assistance level of: Supervision/Verbal cueing LTG: Pt will perform light housekeeping w/level of: Ambulate with device   Problem: RH Toilet Transfers Goal: LTG Patient will perform toilet transfers  w/assist (OT) Description: LTG: Patient will perform toilet transfers with assist, with/without cues using equipment (OT) Flowsheets (Taken 02/03/2022 1228) LTG: Pt will perform toilet transfers with assistance level of: Supervision/Verbal cueing   Problem: RH  Tub/Shower Transfers Goal: LTG Patient will perform tub/shower transfers w/assist (OT) Description: LTG: Patient will perform tub/shower transfers with assist, with/without cues using equipment (OT) Flowsheets (Taken 02/03/2022 1228) LTG: Pt will perform tub/shower stall transfers with assistance level of: Supervision/Verbal cueing LTG: Pt will perform tub/shower transfers from: Walk in shower   Problem: RH Memory Goal: LTG Patient will demonstrate ability for day to day recall/carry over during activities of daily living with assistance level (OT) Description: LTG:  Patient will demonstrate ability for day to day recall/carry over during activities of daily living with assistance level (OT). Flowsheets (Taken 02/03/2022 1228) LTG:  Patient will demonstrate ability for day to day recall/carry over during activities of daily living with assistance level (OT): Independent

## 2022-02-03 NOTE — Progress Notes (Signed)
Occupational Therapy Session Note  Patient Details  Name: Shelby Walker MRN: [id number] Date of Birth: 10-Dec-1933  Today's Date: 02/03/2022 OT Individual Time: 1400-1515 OT Individual Time Calculation (min): 75 min    Short Term Goals: Week 1:  OT Short Term Goal 1 (Week 1): STG=LTG 2/2 ELOS  Skilled Therapeutic Interventions/Progress Updates:  Upon OT arrival, pt semi recumbent in bed reporting back pain 10/10. Pt agreeable to OT treatment. Treatment intervention with a focus on self care retraining, problem solving, and strengthening. Pt completes supine to sit transfer with Supervision and donns shoes EOB with SBA using shoe horn. Pt completes stand pivot transfer to recliner with CGA and eats her lunch with SBA. Increased time required. Pt able to open all of her containers. While seated at tabletop, pt sorts playing cards based on suit using B UE. Pt requires increased time and has one error. Pt reports fatigue from repetitvely reaching to complete task. Pt was provided 2lb dowel and completes elbow flex/ext and overhead press for 3x5-10 reps. Pt completes scapular prot/retr without weight for 3x10 reps. Pt performs stand pivot transfer to EOB with CGA and performs sit to supine transfer with Supervision. Pt completes scooting towards [facility] with Supervision and was left in bed at end of session with all needs met and safety measures in place.   Therapy Documentation Precautions:  Precautions Precautions: Fall Precaution Comments: watch vitals Restrictions Weight Bearing Restrictions: No   Therapy/Group: Individual Therapy  [name] 02/03/2022, 3:01 PM

## 2022-02-03 NOTE — Evaluation (Signed)
Occupational Therapy Assessment and Plan  Patient Details  Name: Shelby Walker MRN: [id number] Date of Birth: 03-31-1933  OT Diagnosis: abnormal posture, acute pain, and muscle weakness (generalized) Rehab Potential: Rehab Potential (ACUTE ONLY): Good ELOS: 7-10 days   Today's Date: 02/03/2022 OT Individual Time: 1962-2297 OT Individual Time Calculation (min): 60 min     Hospital Problem: Principal Problem:   Severe sepsis with acute organ dysfunction due to Gram positive bacteria [facility])   Past Medical History:  Past Medical History:  Diagnosis Date   Bruit    Carotid Doppler showed no significant abnormality     9per patient)   C. difficile colitis 07/2018   with severe sepsis   Chest pain, unspecified    Nuclear, May, 2008, no scar or ischemia   Diabetes mellitus    Diverticulosis    Dyslipidemia    Ejection fraction    EF 55-60%, echo, February, 2011 // Echocardiogram 8/21: EF 60-65, no RWMA, Gr 1 DD, GLS -14%, normal RVSF, mild LAE, trivial MR, mild MS (mean 4 mmHg), RVSP 23.4    GERD (gastroesophageal reflux disease)    Mitral regurgitation 04/21/2009   mild,  echo, February, 2011   Osteoporosis    Palpitations    possible very brief atrial fibrillation on monitor and possible reentrant tachycardia   Psoriasis    Rheumatoid arthritis ([place])    Past Surgical History:  Past Surgical History:  Procedure Laterality Date   ABDOMINAL AORTOGRAM W/LOWER EXTREMITY Right 10/19/2020   Procedure: ABDOMINAL AORTOGRAM W/LOWER EXTREMITY;  Surgeon: Wellington Hampshire, MD;  Location: [place] [facility];  Service: Cardiovascular;  Laterality: Right;   FRACTURE SURGERY     LOOP RECORDER REMOVAL N/A 01/05/2022   Procedure: LOOP RECORDER REMOVAL;  Surgeon: Evans Lance, MD;  Location: [place] [facility];  Service: Cardiovascular;  Laterality: N/A;   PACEMAKER IMPLANT N/A 01/05/2022   Procedure: PACEMAKER IMPLANT;  Surgeon: Evans Lance, MD;  Location: [place] [facility];  Service:  Cardiovascular;  Laterality: N/A;   PPM GENERATOR REMOVAL N/A 01/29/2022   Procedure: PPM GENERATOR REMOVAL;  Surgeon: Evans Lance, MD;  Location: [place] [facility];  Service: Cardiovascular;  Laterality: N/A;   TEE WITHOUT CARDIOVERSION N/A 01/29/2022   Procedure: TRANSESOPHAGEAL ECHOCARDIOGRAM (TEE);  Surgeon: Geralynn Rile, MD;  Location: [place];  Service: Cardiovascular;  Laterality: N/A;   TRANSMETATARSAL AMPUTATION Right 01/18/2021   Procedure: TRANSMETATARSAL AMPUTATION;  Surgeon: Wylene Simmer, MD;  Location: [place];  Service: Orthopedics;  Laterality: Right;    Assessment & Plan Clinical Impression: Shelby Walker is a 88 year old female recently had pacemaker insertion who presented to the emergency department on 01/24/2022 after found unresponsive in her bed by family.  She was incontinent of stool and urine.  She had also vomited.  Her initial blood pressure was 60 palpable by EMS.  O2 sat was 70% on room air.  Code sepsis called.  Patient was placed on 15 L nonrebreather mask, given Rocephin, vancomycin and Flagyl.  IV fluid resuscitation.  Started on Levophed due to worsening blood pressure.  Laboratory workup as well as CT of the chest abdomen pelvis ordered.  Admitted to critical care medicine.  VQ scan negative for pulmonary embolism.  CT of abdomen pelvis without hydronephrosis but demonstrated pyelonephritis.  MRI of brain unremarkable. Urinalysis consistent with UTI.  He has a history of rheumatoid arthritis maintained on prednisone 4 mg daily.  She was given stress dose of steroids.  Medical history  is significant for prior severe C. difficile colitis and C. difficile toxin ordered.  Status improved.  Disease consultation obtained 11/23.  He was started on prophylaxis oral vancomycin.  She was transferred to [facility] service on 11/24.  Electrophysiology consultation obtained that day.  Patient noted to have elevated INR and DIC was ruled out.  She underwent pacemaker  explantation on 1/27.  MRI of the lumbosacral and thoracic spine was formed due to back pain.  MRI findings included L3-4 discitis/osteomyelitis and left psoas edema.  Dr. Daryll Brod recommends continuing cefazolin until 12/06 and transition to oral antibiotics to finish 6 weeks treatment.  He recommends continuing prophylaxis p.o. vancomycin until 1 week off systemic antibiotics. New zoster like rash along left lower lumbar/sacral area.  Airborne/contact isolation placed 11/29. Ongoing nausea. On regular diet. Prednisone at home for RA continued. The patient requires inpatient physical medicine and rehabilitation evaluations and treatment secondary to dysfunction due to bacteremia, sepsis, discitis. She is currently complaining of back pain   PMH significant for right TMA. Ambulates with walker.    Patient transferred to CIR on 02/02/2022 .    Patient currently requires min with basic self-care skills and IADL secondary to muscle weakness, decreased cardiorespiratoy endurance, and decreased standing balance and decreased balance strategies.  Prior to hospitalization, patient could complete ADLs, IADLs, functional mobility and transfers with Mod I using RW or SPC.  Patient will benefit from skilled intervention to increase independence with basic self-care skills prior to discharge home independently.  Anticipate patient will require intermittent supervision and follow up home health.  OT - End of Session Activity Tolerance: Tolerates 10 - 20 min activity with multiple rests Endurance Deficit: Yes Endurance Deficit Description: decreased OT Assessment Rehab Potential (ACUTE ONLY): Good OT Patient demonstrates impairments in the following area(s): Balance;Pain;Endurance OT Basic ADL's Functional Problem(s): Grooming;Bathing;Dressing;Toileting OT Advanced ADL's Functional Problem(s): Light Housekeeping;Simple Meal Preparation;Laundry OT Transfers Functional Problem(s): Toilet;Tub/Shower OT Additional  Impairment(s): None OT Plan OT Intensity: Minimum of 1-2 x/day, 45 to 90 minutes OT Frequency: 5 out of 7 days OT Duration/Estimated Length of Stay: 7-10 days OT Treatment/Interventions: Balance/vestibular training;Discharge planning;Pain management;Self Care/advanced ADL retraining;Therapeutic Activities;UE/LE Coordination activities;Functional mobility training;Patient/family education;Therapeutic Exercise;Psychosocial support;UE/LE Strength taining/ROM;DME/adaptive equipment instruction;Community reintegration OT Basic Self-Care Anticipated Outcome(s): Supervision OT Toileting Anticipated Outcome(s): Supervision OT Bathroom Transfers Anticipated Outcome(s): Supervision OT Recommendation Patient destination: Home Follow Up Recommendations: Home health OT;24 hour supervision/assistance Equipment Recommended: None recommended by OT   OT Evaluation Precautions/Restrictions  Precautions Precautions: Fall Precaution Comments: watch vitals Restrictions Weight Bearing Restrictions: No General Chart Reviewed: Yes Family/Caregiver Present: Yes (sonCommunity education officer) Vital Signs Therapy Vitals BP: (!) 123/54 (after gait 113/52, HR 92) Patient Position (if appropriate): Sitting Pain Pain Assessment Pain Score: 10-Worst pain ever Pain Location: Back Pain Intervention(s): Emotional support;Repositioned Home Living/Prior Functioning Home Living Available Help at Discharge: Family, Available 24 hours/day Type of Home: House Home Access: Stairs to enter, Other (comment) Entrance Stairs-Number of Steps: 2 Entrance Stairs-Rails: Left Home Layout: Laundry or work area in basement, Able to live on main level with bedroom/bathroom, One level (has a chair lift to go down to basement to complete laundry) Bathroom Shower/Tub: Walk-in shower (shower chair, grab bars with suction cups, hand held shower) Bathroom Toilet: Handicapped height Bathroom Accessibility: Yes Additional Comments: has RW, SPC, shoe  horn  Lives With: Alone IADL History Homemaking Responsibilities: Yes Meal Prep Responsibility: Primary Laundry Responsibility: Primary Cleaning Responsibility: Primary Bill Paying/Finance Responsibility: Primary Current License: Yes Mode of Transportation: Car Occupation: Retired  Type of Occupation: Network engineer at [employer]: play games, play cornhole Prior Function Level of Independence: Independent with basic ADLs, Independent with transfers, Independent with homemaking with ambulation, Independent with gait  Able to Take Stairs?: Yes Vocation: Retired Surveyor, mining Baseline Vision/History: 1 Wears glasses Ability to See in Adequate Light: 1 Impaired Patient Visual Report: No change from baseline Vision Assessment?: Yes Eye Alignment: Within Functional Limits Ocular Range of Motion: Within Functional Limits Tracking/Visual Pursuits: Able to track stimulus in all quads without difficulty Saccades: Within functional limits Convergence: Impaired (comment) Visual Fields: No apparent deficits Perception  Perception: Impaired Praxis Praxis: Impaired Cognition Cognition Overall Cognitive Status: Impaired/Different from baseline Arousal/Alertness: Awake/alert Orientation Level: Person;Place;Situation Person: Oriented Place: Oriented Situation: Oriented Memory: Impaired Memory Impairment: Decreased short term memory Attention: Focused Focused Attention: Appears intact Awareness: Appears intact Problem Solving: Appears intact Safety/Judgment: Appears intact Brief Interview for Mental Status (BIMS) Repetition of Three Words (First Attempt): 3 Temporal Orientation: Year: Correct Temporal Orientation: Month: Accurate within 5 days Temporal Orientation: Day: Correct Recall: "Sock": Yes, after cueing ("something to wear") Recall: "Blue": Yes, after cueing ("a color") Recall: "Bed": Yes, after cueing ("a piece of furniture") BIMS Summary Score:  12 Sensation Sensation Light Touch: Appears Intact Hot/Cold: Appears Intact Proprioception: Appears Intact Stereognosis: Not tested Additional Comments: pt does reports new numbness to UE Coordination Gross Motor Movements are Fluid and Coordinated: No Fine Motor Movements are Fluid and Coordinated: No Coordination and Movement Description: pt with RA Finger Nose Finger Test: [facility] Motor  Motor Motor: Other (comment) Motor - Skilled Clinical Observations: generalized weakness  Trunk/Postural Assessment  Cervical Assessment Cervical Assessment: Exceptions to [facility] (forward head) Thoracic Assessment Thoracic Assessment: Exceptions to [facility] (rounded shoulders) Lumbar Assessment Lumbar Assessment: Exceptions to [facility] (posterior pelvic tilt) Postural Control Postural Control: Deficits on evaluation (decreased)  Balance Balance Balance Assessed: Yes Dynamic Sitting Balance Dynamic Sitting - Balance Support: Feet supported Dynamic Sitting - Level of Assistance: 5: Stand by assistance Reach (Patient is able to reach ___ inches to right, left, forward, back): 5 Dynamic Sitting - Balance Activities: Forward lean/weight shifting;Reaching for Consulting civil engineer Standing - Balance Support: Bilateral upper extremity supported Static Standing - Level of Assistance: 4: Min assist (CGA) Static Standing - Comment/# of Minutes: 30 seconds Dynamic Standing Balance Dynamic Standing - Balance Support: Bilateral upper extremity supported Dynamic Standing - Level of Assistance: 4: Min assist (CGA) Dynamic Standing - Balance Activities: Reaching for objects Extremity/Trunk Assessment RUE Assessment RUE Assessment: Exceptions to [facility] Active Range of Motion (AROM) Comments: ~120 degrees shoulder flex General Strength Comments: 3+/5, RA LUE Assessment LUE Assessment: Exceptions to [facility] Active Range of Motion (AROM) Comments: ~120 degrees shoulder flex General Strength Comments: 3+/5,  has RA  Care Tool Care Tool Self Care Eating   Eating Assist Level: Set up assist    Oral Care    Oral Care Assist Level: Set up assist    Bathing   Body parts bathed by patient: Right arm;Left arm;Chest;Abdomen;Front perineal area;Right upper leg;Buttocks;Left upper leg;Right lower leg;Left lower leg;Face     Assist Level: Contact Guard/Touching assist    Upper Body Dressing(including orthotics)   What is the patient wearing?: Bra;Pull over shirt   Assist Level: Minimal Assistance - Patient > 75%    Lower Body Dressing (excluding footwear)   What is the patient wearing?: Underwear/pull up;Pants Assist for lower body dressing: Contact Guard/Touching assist    Putting on/Taking off footwear   What is the patient  wearing?: Non-skid slipper socks Assist for footwear: Minimal Assistance - Patient > 75%       Care Tool Toileting Toileting activity   Assist for toileting: Contact Guard/Touching assist     Care Tool Bed Mobility Roll left and right activity   Roll left and right assist level: Supervision/Verbal cueing    Sit to lying activity   Sit to lying assist level: Supervision/Verbal cueing    Lying to sitting on side of bed activity   Lying to sitting on side of bed assist level: the ability to move from lying on the back to sitting on the side of the bed with no back support.: Supervision/Verbal cueing     Care Tool Transfers Sit to stand transfer   Sit to stand assist level: Minimal Assistance - Patient > 75%    Chair/bed transfer   Chair/bed transfer assist level: Minimal Assistance - Patient > 75%     Toilet transfer   Assist Level: Minimal Assistance - Patient > 75%     Care Tool Cognition  Expression of Ideas and Wants Expression of Ideas and Wants: 3. Some difficulty - exhibits some difficulty with expressing needs and ideas (e.g, some words or finishing thoughts) or speech is not clear  Understanding Verbal and Non-Verbal Content Understanding  Verbal and Non-Verbal Content: 4. Understands (complex and basic) - clear comprehension without cues or repetitions   Memory/Recall Ability Memory/Recall Ability : Current season;That he or she is in a hospital/hospital unit   Refer to Care Plan for [place] 1 OT Short Term Goal 1 (Week 1): STG=LTG 2/2 ELOS  Recommendations for other services: None    Skilled Therapeutic Intervention ADL ADL Eating: Set up Where Assessed-Eating: Bed level Grooming: Setup Where Assessed-Grooming: Sitting at sink Upper Body Bathing: Supervision/safety Where Assessed-Upper Body Bathing: Sitting at sink Lower Body Bathing: Contact guard Where Assessed-Lower Body Bathing: Sitting at sink Upper Body Dressing: Minimal assistance (to manage bra over shoulders) Where Assessed-Upper Body Dressing: Sitting at sink Lower Body Dressing: Minimal assistance (to doff socks) Where Assessed-Lower Body Dressing: Sitting at sink Toileting: Contact guard Where Assessed-Toileting: Glass blower/designer: Psychiatric nurse Method: Counselling psychologist: Energy manager: Unable to assess Social research officer, government: Curator Method: Heritage manager: Manufacturing systems engineer  Bed Mobility Bed Mobility: Supine to Sit;Sit to Supine Supine to Sit: Supervision/Verbal cueing Sit to Supine: Supervision/Verbal cueing Transfers Sit to Stand: Minimal Assistance - Patient > 75% Stand to Sit: Minimal Assistance - Patient > 75%  Upon OT arrival, pt seated in recliner reporting pain in back and was recently medicated. Pt's son present in room and pt agreeable to OT session. OT evaluation initiated, educated on role of OT, plan of care, discharge recommendations. Pt performs sponge bath ADL at the levels above. Pt demo decreased endurance, weakness, decreased balance, decreased activity tolerance, and  decreased ADL status. Pt functioning below her baseline and would benefit from continued OT services to achieve highest level of independence. Pt was left in bed at end of session with all needs met and safety measures in place.   Discharge Criteria: Patient will be discharged from OT if patient refuses treatment 3 consecutive times without medical reason, if treatment goals not met, if there is a change in medical status, if patient makes no progress towards goals or if patient is discharged from hospital.  The above assessment, treatment plan, treatment alternatives and  goals were discussed and mutually agreed upon: by patient and by family  Braya Habermehl 02/03/2022, 12:25 PM

## 2022-02-04 DIAGNOSIS — E119 Type 2 diabetes mellitus without complications: Secondary | ICD-10-CM | POA: Diagnosis not present

## 2022-02-04 DIAGNOSIS — I480 Paroxysmal atrial fibrillation: Secondary | ICD-10-CM | POA: Diagnosis not present

## 2022-02-04 DIAGNOSIS — A4189 Other specified sepsis: Secondary | ICD-10-CM | POA: Diagnosis not present

## 2022-02-04 DIAGNOSIS — E440 Moderate protein-calorie malnutrition: Secondary | ICD-10-CM | POA: Diagnosis not present

## 2022-02-04 LAB — GLUCOSE, CAPILLARY
Glucose-Capillary: 102 mg/dL — ABNORMAL HIGH (ref 70–99)
Glucose-Capillary: 341 mg/dL — ABNORMAL HIGH (ref 70–99)
Glucose-Capillary: 224 mg/dL — ABNORMAL HIGH (ref 70–99)
Glucose-Capillary: 437 mg/dL — ABNORMAL HIGH (ref 70–99)

## 2022-02-04 MED ORDER — SODIUM CHLORIDE 0.45 % IV SOLN: INTRAVENOUS | Status: DC

## 2022-02-04 NOTE — Progress Notes (Addendum)
Afternoon VS was taken and BP was low, on Yellow MEWS. MD and charge nurse were both notified. MD ordered to start .45% NS at 25m/hr, pt was encouraged to drink and eat meal. Pending UA collection. Yellow MEWS protocol VS was implemented.  02/04/22 1542  Assess: MEWS Score  Temp 99.6 F (37.6 C)  BP (!) 65/46  MAP (mmHg) (!) 53  Pulse Rate 94  Resp 19  SpO2 99 %  O2 Device Room Air  Assess: MEWS Score  MEWS Temp 0  MEWS Systolic 3  MEWS Pulse 0  MEWS RR 0  MEWS LOC 0  MEWS Score 3  MEWS Score Color Yellow  Assess: if the MEWS score is Yellow or Red  Were vital signs taken at a resting state? Yes  Focused Assessment Change from prior assessment (see assessment flowsheet)  Does the patient meet 2 or more of the SIRS criteria? No  Does the patient have a confirmed or suspected source of infection? No  Provider and Rapid Response Notified? No  MEWS guidelines implemented *See Row Information* Yes  Treat  Pain Scale 0-10  Pain Score 0  Provider Notification  Provider Name/Title Dr. SNaaman Plummer Date Provider Notified 02/04/22  Time Provider Notified 1549  Method of Notification Call  Notification Reason Change in status (Yellow MEWS. Low BP)  Provider response Other (Comment) (MD will assess the pt, MD will give fluids, awaiting for order.)  Assess: SIRS CRITERIA  SIRS Temperature  0  SIRS Pulse 1  SIRS Respirations  0  SIRS WBC 1  SIRS Score Sum  2

## 2022-02-04 NOTE — Progress Notes (Addendum)
Called by nurse that patient's bp was low (65/46). Pt told nurse she felt weak and wasn't feeling well (which is what she told me yesterday morning). Pt with low grade temp 99.6 which when repeated was 98.4. Pulse 94. Intake has been poor.   Will start 1/2ns at 75cc/ hr. Will check ua, ucx. Pt is already on keflex and oral vanc. Asked nurse to push fluids. Hold lasix and amlodipine for now (already given today).   Meredith Staggers, MD, [place] Director [facility] Services 02/04/2022

## 2022-02-04 NOTE — Progress Notes (Signed)
Patients Cbg 437, on call MD Naaman Plummer notified. 9 units of insulin (novoLOG) given. No new orders this time. Patient in bed with call bell within reach.

## 2022-02-04 NOTE — Progress Notes (Addendum)
PROGRESS NOTE   Subjective/Complaints: Pt had a good day with therapy on Saturday. Appreciates eye gtt's. Has had chronic dry eyes. No other complaints this morning  ROS: Patient denies fever, rash, sore throat, blurred vision, dizziness, nausea, vomiting, diarrhea, cough, shortness of breath or chest pain, joint or back/neck pain, headache, or mood change.    Objective:   No results found. Recent Labs    02/02/22 0230 02/03/22 0312  WBC 12.1* 6.8  HGB 10.4* 8.7*  HCT 31.6* 28.0*  PLT 254 211   Recent Labs    02/02/22 0230 02/03/22 0312  NA 138 140  K 4.4 3.8  CL 100 101  CO2 27 28  GLUCOSE 106* 108*  BUN 21 18  CREATININE 1.34* 1.36*  CALCIUM 8.0* 8.4*    Intake/Output Summary (Last 24 hours) at 02/04/2022 0720 Last data filed at 02/03/2022 1852 Gross per 24 hour  Intake 483 ml  Output --  Net 483 ml        Physical Exam: Vital Signs Blood pressure (!) 113/47, pulse 85, temperature 98.5 F (36.9 C), temperature source Oral, resp. rate 18, SpO2 98 %.  Constitutional: No distress . Vital signs reviewed. HEENT: NCAT, EOMI, oral membranes moist Neck: supple Cardiovascular: RRR without murmur. No JVD    Respiratory/Chest: CTA Bilaterally without wheezes or rales. Normal effort    GI/Abdomen: BS +, non-tender, non-distended Ext: no clubbing, cyanosis, or edema Psych: pleasant and cooperative  Skin: Clean and intact without signs of breakdown Neuro:  Alert and oriented x 3. Normal insight and awareness. Intact Memory. Normal language and speech. Cranial nerve exam unremarkable. Motor 4/5 UE, 3->4/5 LE prox to distal. No focal sensory findings.  Musculoskeletal: TMA well shaped. Normal ROM, fair sitting posture    Assessment/Plan: 1. Functional deficits which require 3+ hours per day of interdisciplinary therapy in a comprehensive inpatient rehab setting. Physiatrist is providing close team supervision  and 24 hour management of active medical problems listed below. Physiatrist and rehab team continue to assess barriers to discharge/monitor patient progress toward functional and medical goals  Care Tool:  Bathing    Body parts bathed by patient: Right arm, Left arm, Chest, Abdomen, Front perineal area, Right upper leg, Buttocks, Left upper leg, Right lower leg, Left lower leg, Face         Bathing assist Assist Level: Contact Guard/Touching assist     Upper Body Dressing/Undressing Upper body dressing   What is the patient wearing?: Bra, Pull over shirt    Upper body assist Assist Level: Minimal Assistance - Patient > 75%    Lower Body Dressing/Undressing Lower body dressing      What is the patient wearing?: Underwear/pull up, Pants     Lower body assist Assist for lower body dressing: Contact Guard/Touching assist     Toileting Toileting    Toileting assist Assist for toileting: Contact Guard/Touching assist     Transfers Chair/bed transfer  Transfers assist     Chair/bed transfer assist level: Minimal Assistance - Patient > 75%     Locomotion Ambulation   Ambulation assist      Assist level: Contact Guard/Touching assist Assistive device: Walker-rolling Max distance:  125   Walk 10 feet activity   Assist     Assist level: Contact Guard/Touching assist Assistive device: Walker-rolling   Walk 50 feet activity   Assist    Assist level: Contact Guard/Touching assist Assistive device: Walker-rolling    Walk 150 feet activity   Assist Walk 150 feet activity did not occur: Safety/medical concerns (dizziness)         Walk 10 feet on uneven surface  activity   Assist Walk 10 feet on uneven surfaces activity did not occur: Safety/medical concerns (dizziness)         Wheelchair     Assist Is the patient using a wheelchair?: Yes Type of Wheelchair: Manual    Wheelchair assist level: Minimal Assistance - Patient > 75% Max  wheelchair distance: 30    Wheelchair 50 feet with 2 turns activity    Assist    Wheelchair 50 feet with 2 turns activity did not occur:  (bil UE fatigue; pt propelled 30')   Assist Level: Moderate Assistance - Patient 50 - 74%   Wheelchair 150 feet activity     Assist  Wheelchair 150 feet activity did not occur:  (bil UE fatigue; pt propelled 30')   Assist Level: Total Assistance - Patient < 25%   Blood pressure (!) 113/47, pulse 85, temperature 98.5 F (36.9 C), temperature source Oral, resp. rate 18, SpO2 98 %.  Medical Problem List and Plan: 1. Functional deficits secondary to debility secondary to sepsis.              -patient may shower             -ELOS/Goals: 8-12 days S            -Continue CIR therapies including PT, OT  2.  Antithrombotics: -DVT/anticoagulation:  Pharmaceutical: Lovenox>> start 30 mg daily             -antiplatelet therapy: None 3. Pain Management: Scheduled Tylenol '650mg'$  for back pain.  Continue Lidoderm patches.  Aquathermia pad. 4. Mood/Behavior/Sleep: LCSW to evaluate and provide emotional support             -antipsychotic agents: n/a 5. Neuropsych/cognition: This patient is capable of making decisions on her own behalf. 6. Skin/Wound Care: Routine skin care checks 7. Fluids/Electrolytes/Nutrition: Strict I's and O's and follow-up chemistries             -Ensure with meals             -change to carb modified diet   12/3 intake remains poor-20-25% at best yesterday -continue trial of megace to boost appetite -recent bmet wnl except for mild CR elevation which has been baseline 8: Paroxysmal atrial fibrillation: s/p ILR on 06/2021; no AC    9: Sinus node dysfunction s/p [employer] pacemaker 01/05/2022; Dr. Beckie Salts             -subsequent generator removal 11/27             -continue Pacerone 200 mg daily              10: Diastolic dysfunction: Daily weight, monitor electrolytes             -continue Lasix 20 mg daily   11:  Diabetes mellitus: CBGs q AC and q HS; carb modified diet             -continue SSI (may be able to dc when on CM diet)             -  at home on Tresiba 6-8 units at bedtime, Novolog 3 units with meals   CBG (last 3)  Recent Labs    02/03/22 1634 02/03/22 2123 02/04/22 0629  GLUCAP 175* 260* 102*    12/3  fair control so far but she's still not eating much 12: Gastroesophageal reflux disease: continue Protonix   13: Hyperlipidemia: follow-up LFTs/restart Lipitor 20 mg daily at discharge   14: Rheumatoid arthritis: continue prednisone 5 mg daily   15: Paroxysmal atrial fibrillation: s/p ILR on 06/2021; no AC              -Zio patch placed today   16: Chronic kidney disease: Cr consistently around 1.2-1.3   17: Hypertension: monitor TID and prn             -continue Norvasc 10 mg daily             -continue Lasix 20 mg daily   18: Nausea: will schedule Zofran prior to antibiotic administration   19: Periostitis of lumbar spine without osteomyelitis/MSSA bacteremia:             -Cefazolin 2 grams twice daily through 12/6   20: History of C. difficile colitis: No evidence of recurrent infection             -continue oral vancomycin 125 mg BID while on cefazolin and 1 week thereafter             -continue Florastor 21: Zoster, left posterior hip: continue valacylovir 1000 mg daily   -now just on contact precautions 22. Anemia:  Hgb fluctuating between 8 and 10. Likely d/t chronic disease/CKD  -check Iron panel tomorrow    LOS: 2 days A FACE TO FACE EVALUATION WAS PERFORMED  Meredith Staggers 02/04/2022, 7:20 AM

## 2022-02-05 DIAGNOSIS — T827XXD Infection and inflammatory reaction due to other cardiac and vascular devices, implants and grafts, subsequent encounter: Secondary | ICD-10-CM | POA: Diagnosis not present

## 2022-02-05 DIAGNOSIS — B029 Zoster without complications: Secondary | ICD-10-CM

## 2022-02-05 DIAGNOSIS — I5189 Other ill-defined heart diseases: Secondary | ICD-10-CM

## 2022-02-05 DIAGNOSIS — M069 Rheumatoid arthritis, unspecified: Secondary | ICD-10-CM

## 2022-02-05 DIAGNOSIS — N179 Acute kidney failure, unspecified: Secondary | ICD-10-CM | POA: Diagnosis not present

## 2022-02-05 DIAGNOSIS — M4646 Discitis, unspecified, lumbar region: Secondary | ICD-10-CM

## 2022-02-05 DIAGNOSIS — K5903 Drug induced constipation: Secondary | ICD-10-CM

## 2022-02-05 DIAGNOSIS — A4189 Other specified sepsis: Secondary | ICD-10-CM | POA: Diagnosis not present

## 2022-02-05 DIAGNOSIS — A0472 Enterocolitis due to Clostridium difficile, not specified as recurrent: Secondary | ICD-10-CM | POA: Diagnosis not present

## 2022-02-05 DIAGNOSIS — I10 Essential (primary) hypertension: Secondary | ICD-10-CM | POA: Diagnosis not present

## 2022-02-05 DIAGNOSIS — B9561 Methicillin susceptible Staphylococcus aureus infection as the cause of diseases classified elsewhere: Secondary | ICD-10-CM

## 2022-02-05 LAB — BASIC METABOLIC PANEL
Anion gap: 9 (ref 5–15)
BUN: 32 mg/dL — ABNORMAL HIGH (ref 8–23)
CO2: 27 mmol/L (ref 22–32)
Calcium: 7.7 mg/dL — ABNORMAL LOW (ref 8.9–10.3)
Chloride: 100 mmol/L (ref 98–111)
Creatinine, Ser: 1.8 mg/dL — ABNORMAL HIGH (ref 0.44–1.00)
GFR, Estimated: 27 mL/min — ABNORMAL LOW (ref >60)
Glucose, Bld: 166 mg/dL — ABNORMAL HIGH (ref 70–99)
Potassium: 4.1 mmol/L (ref 3.5–5.1)
Sodium: 136 mmol/L (ref 135–145)

## 2022-02-05 LAB — CBC
HCT: 25.3 % — ABNORMAL LOW (ref 36.0–46.0)
Hemoglobin: 8.3 g/dL — ABNORMAL LOW (ref 12.0–15.0)
MCH: 29.6 pg (ref 26.0–34.0)
MCHC: 32.8 g/dL (ref 30.0–36.0)
MCV: 90.4 fL (ref 80.0–100.0)
Platelets: 226 10*3/uL (ref 150–400)
RBC: 2.8 MIL/uL — ABNORMAL LOW (ref 3.87–5.11)
RDW: 15.2 % (ref 11.5–15.5)
WBC: 7.6 10*3/uL (ref 4.0–10.5)
nRBC: 0 % (ref 0.0–0.2)

## 2022-02-05 LAB — URINALYSIS, ROUTINE W REFLEX MICROSCOPIC
Bilirubin Urine: NEGATIVE
Glucose, UA: 50 mg/dL — AB
Ketones, ur: NEGATIVE mg/dL
Nitrite: NEGATIVE
Protein, ur: 30 mg/dL — AB
Specific Gravity, Urine: 1.008 (ref 1.005–1.030)
pH: 7 (ref 5.0–8.0)

## 2022-02-05 LAB — GLUCOSE, CAPILLARY
Glucose-Capillary: 153 mg/dL — ABNORMAL HIGH (ref 70–99)
Glucose-Capillary: 295 mg/dL — ABNORMAL HIGH (ref 70–99)
Glucose-Capillary: 238 mg/dL — ABNORMAL HIGH (ref 70–99)
Glucose-Capillary: 212 mg/dL — ABNORMAL HIGH (ref 70–99)

## 2022-02-05 LAB — IRON AND TIBC
Iron: 41 ug/dL (ref 28–170)
Saturation Ratios: 21 % (ref 10.4–31.8)
TIBC: 192 ug/dL — ABNORMAL LOW (ref 250–450)
UIBC: 151 ug/dL

## 2022-02-05 LAB — FERRITIN: Ferritin: 102 ng/mL (ref 11–307)

## 2022-02-05 LAB — PREALBUMIN: Prealbumin: 18 mg/dL (ref 18–38)

## 2022-02-05 MED ORDER — DICLOFENAC SODIUM 1 % EX GEL
4.0000 g | Freq: Four times a day (QID) | CUTANEOUS | Status: DC
  Administered 2022-02-05 – 2022-02-14 (×28): 4 g via TOPICAL
  Filled 2022-02-05: qty 100

## 2022-02-05 MED ORDER — SODIUM CHLORIDE 0.9 % IV SOLN: INTRAVENOUS | Status: DC

## 2022-02-05 MED ORDER — SORBITOL 70 % SOLN
45.0000 mL | Freq: Once | Status: DC
  Filled 2022-02-05: qty 60

## 2022-02-05 NOTE — Progress Notes (Incomplete)
PHARMACY CONSULT NOTE FOR:  OUTPATIENT  PARENTERAL ANTIBIOTIC THERAPY (OPAT)  Indication:  Regimen:  End date:   IV antibiotic discharge orders are pended. To discharging provider:  please sign these orders via discharge navigator,  Select New Orders & click on the button choice - Manage This Unsigned Work.     Thank you for allowing pharmacy to be a part of this patient's care.  Lawson Radar 02/05/2022, 9:18 AM

## 2022-02-05 NOTE — Discharge Summary (Signed)
Physician Discharge Summary  Patient ID: Shelby Walker MRN: [id number] DOB/AGE: 1933/06/14 88 y.o.  Admit date: 02/02/2022 Discharge date: 02/15/2022  Discharge Diagnoses:  Principal Problem:   Severe sepsis with acute organ dysfunction due to Gram positive bacteria (HCC) Active Problems:   Discitis of lumbar region   Herpes zoster without complication   Clostridium difficile colitis Active problems: Debility secondary to sepsis Paroxysmal atrial fibrillation Sinus node dysfunction Diastolic dysfunction Diabetes mellitus Gastroesophageal reflux disease Hyperlipidemia Rheumatoid arthritis Chronic kidney disease Hypertension Nausea Periostitis of lumbar spine History of recurrent C. difficile colitis Herpes zoster  Discharged Condition: good  Significant Diagnostic Studies:  Labs:  Basic Metabolic Panel: Recent Labs  Lab 02/09/22 1042 02/11/22 2354 02/14/22 0413  NA 138 140 140  K 4.2 4.8 4.2  CL 109 109 111  CO2 21* 22 22  GLUCOSE 323* 152* 120*  BUN 18 26* 20  CREATININE 1.47* 1.62* 1.17*  CALCIUM 8.1* 8.2* 8.3*    CBC: Recent Labs  Lab 02/09/22 1042 02/12/22 0349  WBC 5.1 5.3  NEUTROABS 2.4  --   HGB 8.5* 8.1*  HCT 26.8* 26.3*  MCV 94.4 95.6  PLT 243 217    CBG: Recent Labs  Lab 02/14/22 1215 02/14/22 1614 02/14/22 2044 02/15/22 0453 02/15/22 1137  GLUCAP 229* 144* 180* 124* 239*  239*    Brief HPI:   Shelby Walker is a 88 y.o. female who recently had pacemaker insertion who presented to the emergency department on 01/24/2022 after found unresponsive in her bed by family. She was incontinent of stool and urine. She had also vomited. Her initial blood pressure was 60 palpable by EMS. O2 sat was 70% on room air. Code sepsis called. Patient was placed on 15 L nonrebreather mask, given Rocephin, vancomycin and Flagyl. IV fluid resuscitation. Started on Levophed due to worsening blood pressure. Laboratory workup as well as CT of the chest abdomen  pelvis ordered. Admitted to critical care medicine. VQ scan negative for pulmonary embolism. CT of abdomen pelvis without hydronephrosis but demonstrated pyelonephritis. MRI of brain unremarkable. Urinalysis consistent with UTI. He has a history of rheumatoid arthritis maintained on prednisone 4 mg daily. She was given stress dose of steroids. Medical history is significant for prior severe C. difficile colitis and C. difficile toxin ordered. Status improved. Disease consultation obtained 11/23. He was started on prophylaxis oral vancomycin. She was transferred to [facility] service on 11/24. Electrophysiology consultation obtained that day. Patient noted to have elevated INR and DIC was ruled out. She underwent pacemaker explantation on 1/27. MRI of the lumbosacral and thoracic spine was formed due to back pain. MRI findings included L3-4 discitis/osteomyelitis and left psoas edema. Dr. Daryll Brod recommends continuing cefazolin until 12/06 and transition to oral antibiotics to finish 6 weeks treatment. He recommends continuing prophylaxis p.o. vancomycin until 1 week off systemic antibiotics. New zoster like rash along left lower lumbar/sacral area. Airborne/contact isolation placed 11/29. Ongoing nausea. On regular diet. Prednisone at home for RA continued.    Hospital Course: Shelby Walker was admitted to rehab 02/02/2022 for inpatient therapies to consist of PT, ST and OT at least three hours five days a week. Past admission physiatrist, therapy team and rehab RN have worked together to provide customized collaborative inpatient rehab. Megace started to boost appetite. Follow-up labs 12/3 with elevated creatinine but at baseline. Iron panel obtained secondary to fluctuating H and H. IVFs started due to low BP. UA and culture obtained. Remains on antibiotics for discitis. Continued  IVFs for creatinine of 1.8 on 12/4. Iron panel normal except low TIBC = 192. Dr. Tommy Medal saw her on 12/4. Plan to continue cefazolin IV through  03/11/2022. Continue prophylactic vanc and Valtrex completed for Zoster. Will need follow-up CT prior to stopping antibiotics. Creatinine down to 1.68 on 12/6. IVFs continue. Creatinine down to 1.48 on 12/07 and IVFs discontinued. Fatigued and listless on 12/08. EKG with NSR. Sed rate was 18, CRP was 0.5. Consulted EP team to evaluate for possible pauses. ZIO patch on with no alerts. Fatigue improved/resolved. Steri-strips removed from incision and is healing well.  Creatinine improved to 1.17 on 12/13. Weight stable.   Blood pressures were monitored on TID basis and remained initially controlled on Norvasc 10 mg daily, Lasix 20 mg daily. Held on 12/3 due to hypotension. Hypotensive on 12/04 and Norvasc and Lasix held.  Diabetes has been monitored with ac/hs CBG checks and SSI was use prn for tighter BS control. Treated for BS of 437 with 9 units Novolog on 12/3. Novolog 2 units with meals started on 12/5. Increased to 3 units with meals on 12/08>>back to 2 units with meals on 12/12.  Semglee 4 units at bedtime started on 12/12.   Rehab course: During patient's stay in rehab weekly team conferences were held to monitor patient's progress, set goals and discuss barriers to discharge. At admission, patient required min assist with basic self-care skills and IADL, min assist with mobility.  She has had improvement in activity tolerance, balance, postural control as well as ability to compensate for deficits. She has had improvement in functional use RUE/LUE  and RLE/LLE as well as improvement in awareness. Pt amb from room to day room before resting in w/c. BUE AROM with small ball. Pt reports weakness in Lt shoulder but able to complete 3 sets of chest presses x8. Pt amb with RW back to room and sat EOB. Sit>supine with supervision.   Disposition: Home Discharge disposition: 01-Home or Self Care      Diet: carb modified  Special Instructions: No driving, alcohol consumption or tobacco use.  Has  follow-up with Dr. Gale Journey arranged.  Per Dr. Tommy Medal on 02/06/2022:  "Given we with an intracardiac device infection I would PREFER to give her 6 weeks of IV cefazolin from date of Pacemaker removal thru January 7th, 2024  I also think she should have repeat imaging prior to stopping IV antibiotics in case she has a psoas abscess that was apparent later on imaging."  Vancocin BID for history of recurrent C. diff prescribed through 03/18/2022  30-35 minutes were spent on discharge planning and discharge summary. Discharge Instructions     [facility] pharmacist to adjust dose for Vancomycin, Aminoglycosides and other anti-infective therapies as requested by physician.   Complete by: As directed    [facility] to provide Cath Flo 44m   Complete by: As directed    Administer for PICC line occlusion and as ordered by physician for other access device issues.   Anaphylaxis Kit: Provided to treat any anaphylactic reaction to the medication being provided to the patient if First Dose or when requested by physician   Complete by: As directed    Epinephrine 17mml vial / amp: Administer 0.22m8m0.22ml59mubcutaneously once for moderate to severe anaphylaxis, nurse to call physician and pharmacy when reaction occurs and call 911 if needed for immediate care   Diphenhydramine 50mg34mIV vial: Administer 25-50mg 51mM PRN for first dose reaction, rash, itching, mild  reaction, nurse to call physician and pharmacy when reaction occurs   Sodium Chloride 0.9% NS 538m IV: Administer if needed for hypovolemic blood pressure drop or as ordered by physician after call to physician with anaphylactic reaction   Change dressing on IV access line weekly and PRN   Complete by: As directed    Discharge patient   Complete by: As directed    Discharge disposition: 01-Home or Self Care   Discharge patient date: 02/15/2022   Flush IV access with Sodium Chloride 0.9% and Heparin 10 units/ml or 100  units/ml   Complete by: As directed    Home infusion instructions - [facility]   Complete by: As directed    Instructions: Flush IV access with Sodium Chloride 0.9% and Heparin 10units/ml or 100units/ml   Change dressing on IV access line: Weekly and PRN   Instructions Cath Flo 258m Administer for PICC Line occlusion and as ordered by physician for other access device   [facility] pharmacist to adjust dose for: Vancomycin, Aminoglycosides and other anti-infective therapies as requested by physician   Method of administration may be changed at the discretion of home infusion pharmacist based upon assessment of the patient and/or caregiver's ability to self-administer the medication ordered   Complete by: As directed       Allergies as of 02/15/2022       Reactions   Cholestyramine    Possible- Makes throat burn. Patient said she can't take it    Compazine [prochlorperazine Edisylate] Swelling   REACTION: " tongue swell and unable to swallow"   Sulfonamide Derivatives Other (See Comments)   REACTION: " broke out with fine itching bumps"   Hydrocodone Other (See Comments)   Infliximab Other (See Comments)   Other reaction(s): rash   Leflunomide    Other reaction(s): diarrhea   Prochlorperazine Other (See Comments)   Other reaction(s): Unknown   Rosuvastatin    Other reaction(s): muscle aches   Sulfa Antibiotics Other (See Comments)   Other reaction(s): tongue swelling        Medication List     STOP taking these medications    acetaminophen 650 MG CR tablet Commonly known as: TYLENOL Replaced by: acetaminophen 325 MG tablet   acetaminophen-codeine 300-30 MG tablet Commonly known as: TYLENOL #3   amLODipine 10 MG tablet Commonly known as: NORVASC   furosemide 20 MG tablet Commonly known as: LASIX   lip balm ointment   liver oil-zinc oxide 40 % ointment Commonly known as: DESITIN   traMADol 50 MG tablet Commonly known as: ULTRAM    valACYclovir 1000 MG tablet Commonly known as: VALTREX       TAKE these medications    acetaminophen 325 MG tablet Commonly known as: TYLENOL Take 1 tablet (325 mg total) by mouth every 6 (six) hours. Replaces: acetaminophen 650 MG CR tablet   amiodarone 200 MG tablet Commonly known as: PACERONE Take 1 tablet (200 mg total) by mouth daily. What changed: See the new instructions.   atorvastatin 20 MG tablet Commonly known as: LIPITOR Take 1 tablet (20 mg total) by mouth daily.   Calcium Carbonate-Vitamin D 600-400 MG-UNIT tablet Take 1 tablet by mouth daily at 12 noon.   ceFAZolin  IVPB Commonly known as: ANCEF Inject 2 g into the vein every 12 (twelve) hours. Indication:  ICD infection First Dose: No Last Day of Therapy:  03/11/22 Labs - Once weekly:  CBC/D and BMP, Labs - Every other week:  ESR and  CRP Method of administration: IV Push Method of administration may be changed at the discretion of home infusion pharmacist based upon assessment of the patient and/or caregiver's ability to self-administer the medication ordered.   ceFAZolin 2-4 GM/100ML-% IVPB Commonly known as: ANCEF Inject 100 mLs (2 g total) into the vein 2 (two) times daily.   CENTRUM SILVER 50+WOMEN PO Take 1 tablet by mouth daily.   Chlorhexidine Gluconate Cloth 2 % Pads Apply 6 each topically every 12 (twelve) hours.   diclofenac Sodium 1 % Gel Commonly known as: VOLTAREN Apply 4 g topically 4 (four) times daily. What changed: how much to take   feeding supplement Liqd Take 237 mLs by mouth 2 (two) times daily with a meal. What changed:  when to take this additional instructions   gabapentin 100 MG capsule Commonly known as: NEURONTIN Take 1 capsule (100 mg total) by mouth 3 (three) times daily.   insulin aspart 100 UNIT/ML FlexPen Commonly known as: NOVOLOG Inject 2 Units into the skin 3 (three) times daily with meals. Sliding scale What changed: how much to take   Insulin  Degludec 100 UNIT/ML Soln Commonly known as: Tresiba Inject 4 Units into the skin at bedtime. What changed:  medication strength how much to take   lidocaine 5 % Commonly known as: LIDODERM Place 1 patch onto the skin daily. Remove & Discard patch within 12 hours or as directed by MD   megestrol 400 MG/10ML suspension Commonly known as: MEGACE Take 10 mLs (400 mg total) by mouth 2 (two) times daily.   methocarbamol 500 MG tablet Commonly known as: ROBAXIN Take 1 tablet (500 mg total) by mouth every 6 (six) hours as needed for muscle spasms.   nitroGLYCERIN 0.4 MG SL tablet Commonly known as: NITROSTAT Dissolve 1 tablet under the tongue every 5 minutes as needed for chest pain. Max of 3 doses, then 911.   pantoprazole 40 MG tablet Commonly known as: PROTONIX Take 1 tablet (40 mg total) by mouth daily.   polyethylene glycol 17 g packet Commonly known as: MIRALAX / GLYCOLAX Take 17 g by mouth daily as needed for mild constipation.   predniSONE 5 MG tablet Commonly known as: DELTASONE Take 1 tablet (5 mg total) by mouth daily with breakfast.   Restasis 0.05 % ophthalmic emulsion Generic drug: cycloSPORINE Place 1 drop into both eyes 2 (two) times daily.   saccharomyces boulardii 250 MG capsule Commonly known as: FLORASTOR Take 250 mg by mouth 2 (two) times daily.   Selsun Blue Dry Scalp 1 % shampoo Generic drug: pyrithione zinc Apply 1 application topically once a week.   sodium chloride 0.9 % infusion Inject 0-10 mLs into the vein as needed (for administration of IV medications (carrier fluid)).   traZODone 50 MG tablet Commonly known as: DESYREL Take 0.5-1 tablets (25-50 mg total) by mouth at bedtime as needed for sleep.   vancomycin 125 MG capsule Commonly known as: VANCOCIN Take 1 capsule (125 mg total) by mouth 2 (two) times daily.               Discharge Care Instructions  (From admission, onward)           Start     Ordered   02/15/22 0000   Change dressing on IV access line weekly and PRN  (Home infusion instructions - [facility] )        02/15/22 1053            Follow-up Information  Jennye Boroughs, MD Follow up.   Specialty: Physical Medicine and Rehabilitation Why: office will call you to arrange your appt (sent) Contact information: [address] [place] [address] [phone]         Jabier Mutton, MD. Go to.   Specialty: Infectious Diseases Contact information: [address] [place] [address] [phone]         Nahser, Wonda Cheng, MD. Call.   Specialty: Cardiology Contact information: [place] [address] [phone]         Reynold Bowen, MD Follow up.   Specialty: Endocrinology Why: Call in 1-2 days to make arrangements for hospital follow-up appointment. Contact information: [address] [place] [address] [phone]         Evans Lance, MD Follow up.   Specialty: Cardiology Why: Call in 1-2 days to make arrangements for hospital follow-up appointment. Contact information: [address] [place] [address] [phone]                 Signed: Barbie Banner 02/15/2022, 1:13 PM

## 2022-02-05 NOTE — Progress Notes (Signed)
PHARMACY CONSULT NOTE FOR:  OUTPATIENT  PARENTERAL ANTIBIOTIC THERAPY (OPAT)  Indication: ICD infection Regimen: Cefazolin 2gm Iv q12h End date: 03/11/22  IV antibiotic discharge orders are pended. To discharging provider:  please sign these orders via discharge navigator,  Select New Orders & click on the button choice - Manage This Unsigned Work.     Shelby Walker A. Levada Dy, PharmD, BCPS, FNKF Clinical Pharmacist [place] Please utilize Amion for appropriate phone number to reach the unit pharmacist ([place])  02/05/2022, 2:17 PM

## 2022-02-05 NOTE — Progress Notes (Signed)
Occupational Therapy Session Note  Patient Details  Name: Shelby Walker MRN: [id number] Date of Birth: Jun 07, 1933  Today's Date: 02/05/2022 OT Individual Time: 1015-1040 OT Individual Time Calculation (min): 25 min    Short Term Goals: Week 1:  OT Short Term Goal 1 (Week 1): STG=LTG 2/2 ELOS  Skilled Therapeutic Interventions/Progress Updates:    Pt received in the w/c and reporting fatigue from earlier therapy sessions. Pt declined a shower and other activity today. Pt encouraged to void and then could get into the bed to rest before lunch. Pt ambulated to the bathroom with contact guard with RW. Pt able to perform toileting with mod A- assistance with hygiene after BM - accidentally wiped BM in her vagina. Assisted with pericare. Pt able to change her pad and perform clothing management. Ambulated back to sink in same manner with RW and stood at the sink with supervision to wash hands. Ambulated with RW to bed. Supervision to doff shoes and get into bed with cues for scooting towards HOB. Left resting in the bed with RN on her way to hook up IV.  All needs at bed side.   Therapy Documentation Precautions:  Precautions Precautions: Fall Precaution Comments: watch vitals Restrictions Weight Bearing Restrictions: No General: General OT Amount of Missed Time: 20 Minutes due to fatigue    Pain: Pain Assessment Pain Scale: 0-10 Pain Score: 0-No pain    Therapy/Group: Individual Therapy  Willeen Cass [facility] 02/05/2022, 10:44 AM

## 2022-02-05 NOTE — Progress Notes (Signed)
Attempted to meet patient, however was asleep. Will try again.

## 2022-02-05 NOTE — Progress Notes (Signed)
Occupational Therapy Session Note  Patient Details  Name: Shelby Walker MRN: [id number] Date of Birth: 1933-06-06  Today's Date: 02/05/2022 OT Individual Time: 1330-1430 OT Individual Time Calculation (min): 60 min    Short Term Goals: Week 1:  OT Short Term Goal 1 (Week 1): STG=LTG 2/2 ELOS  Skilled Therapeutic Interventions/Progress Updates:    Upon OT arrival, pt semi recumbent in bed with family and friends present at bedside. Pt agreeable to OT session. Treatment intervention with a focus on self care retraining, IADLs, strengthening, and endurance. Pt completes supine to sit transfer independently and sits EOB to doff non skid socks with Min A and donn shoes with Setup. Pt completes stand pivot transfer to w/c with CGA and was transported to ortho gym via w/c and total A. Pt sits at arm bike to complete ~8 minutes with 3 rest breaks. Pt's O2 read 92%. Pt was transported to ADL apartment via w/c and total A. Pt engages in kitchen mobility task with RW and CGA to retrieve items from countertop and sort back into their designated location following labels on cabinets and drawers. Pt requires min verbal cues for safe walker placement and to use the labels on cabinets to correctly sort. Pt noted to demonstrate decreased short term memory during task the more fatigued she got. Pt able to reach in all planes and manage microwave and oven. Pt completes task with CGA using RW and takes one seated rest break to complete. Pt's HR up to 90 during task but O2 remains in 90s. Pt was returned to her room via w/c and total A completing stand pivot transfer to bed with CGA and sit to supine transfer with Supervision. Pt left in bed at end of session with all needs met and safety measures in place.   Therapy Documentation Precautions:  Precautions Precautions: Fall Precaution Comments: watch vitals Restrictions Weight Bearing Restrictions: No   Therapy/Group: Individual Therapy  Vidya Bamford 02/05/2022, 4:04 PM

## 2022-02-05 NOTE — Progress Notes (Signed)
Inpatient Rehabilitation  Patient information reviewed and entered into eRehab system by Hue Frick M. Yasmin Dibello, M.A., CCC/SLP, PPS Coordinator.  Information including medical coding, functional ability and quality indicators will be reviewed and updated through discharge.    

## 2022-02-05 NOTE — Progress Notes (Signed)
PROGRESS NOTE   Subjective/Complaints: Pt seen at bedside this AM. Reports constipation.  BP has been soft.   ROS: Patient denies fever, rash, sore throat, blurred vision, dizziness, nausea, vomiting, diarrhea, cough, shortness of breath or chest pain, joint or back/neck pain, headache, or mood change.  + constipation   Objective:   No results found. Recent Labs    02/03/22 0312 02/05/22 0415  WBC 6.8 7.6  HGB 8.7* 8.3*  HCT 28.0* 25.3*  PLT 211 226    Recent Labs    02/03/22 0312 02/05/22 0415  NA 140 136  K 3.8 4.1  CL 101 100  CO2 28 27  GLUCOSE 108* 166*  BUN 18 32*  CREATININE 1.36* 1.80*  CALCIUM 8.4* 7.7*     Intake/Output Summary (Last 24 hours) at 02/05/2022 1102 Last data filed at 02/04/2022 1700 Gross per 24 hour  Intake 243.89 ml  Output --  Net 243.89 ml         Physical Exam: Vital Signs Blood pressure (!) 96/55, pulse 81, temperature 98.5 F (36.9 C), resp. rate 16, SpO2 97 %.  Constitutional: No distress .  Sitting in bed. Vital signs reviewed. HEENT: NCAT, EOMI, oral membranes moist Neck: supple Cardiovascular: RRR without murmur. No JVD    Respiratory/Chest: CTA Bilaterally without wheezes or rales. Normal effort    GI/Abdomen: BS +, non-tender, non-distended Ext: no clubbing, cyanosis, or edema Psych: pleasant and cooperative Skin: warm and dry, Clean and intact without signs of breakdown Neuro:  Alert and oriented x 3. Normal insight and awareness. Intact Memory. Normal language and speech. Cranial nerve exam unremarkable. Motor 4/5 UE, 3->4/5 LE prox to distal. No focal sensory findings.  Musculoskeletal: TMA well shaped. Normal ROM, fair sitting posture    Assessment/Plan: 1. Functional deficits which require 3+ hours per day of interdisciplinary therapy in a comprehensive inpatient rehab setting. Physiatrist is providing close team supervision and 24 hour management of  active medical problems listed below. Physiatrist and rehab team continue to assess barriers to discharge/monitor patient progress toward functional and medical goals  Care Tool:  Bathing    Body parts bathed by patient: Right arm, Left arm, Chest, Abdomen, Front perineal area, Right upper leg, Buttocks, Left upper leg, Right lower leg, Left lower leg, Face         Bathing assist Assist Level: Contact Guard/Touching assist     Upper Body Dressing/Undressing Upper body dressing   What is the patient wearing?: Bra, Pull over shirt    Upper body assist Assist Level: Minimal Assistance - Patient > 75%    Lower Body Dressing/Undressing Lower body dressing      What is the patient wearing?: Underwear/pull up, Pants     Lower body assist Assist for lower body dressing: Contact Guard/Touching assist     Toileting Toileting    Toileting assist Assist for toileting: Contact Guard/Touching assist     Transfers Chair/bed transfer  Transfers assist     Chair/bed transfer assist level: Minimal Assistance - Patient > 75%     Locomotion Ambulation   Ambulation assist      Assist level: Contact Guard/Touching assist Assistive device: Walker-rolling Max distance:  125   Walk 10 feet activity   Assist     Assist level: Contact Guard/Touching assist Assistive device: Walker-rolling   Walk 50 feet activity   Assist    Assist level: Contact Guard/Touching assist Assistive device: Walker-rolling    Walk 150 feet activity   Assist Walk 150 feet activity did not occur: Safety/medical concerns (dizziness)         Walk 10 feet on uneven surface  activity   Assist Walk 10 feet on uneven surfaces activity did not occur: Safety/medical concerns (dizziness)         Wheelchair     Assist Is the patient using a wheelchair?: Yes Type of Wheelchair: Manual    Wheelchair assist level: Minimal Assistance - Patient > 75% Max wheelchair distance: 30     Wheelchair 50 feet with 2 turns activity    Assist    Wheelchair 50 feet with 2 turns activity did not occur:  (bil UE fatigue; pt propelled 30')   Assist Level: Moderate Assistance - Patient 50 - 74%   Wheelchair 150 feet activity     Assist  Wheelchair 150 feet activity did not occur:  (bil UE fatigue; pt propelled 30')   Assist Level: Total Assistance - Patient < 25%   Blood pressure (!) 96/55, pulse 81, temperature 98.5 F (36.9 C), resp. rate 16, SpO2 97 %.  Medical Problem List and Plan: 1. Functional deficits secondary to debility secondary to sepsis.              -patient may shower             -ELOS/Goals: 8-12 days S            -Continue CIR therapies including PT, OT  2.  Antithrombotics: -DVT/anticoagulation:  Pharmaceutical: Lovenox>> start 30 mg daily             -antiplatelet therapy: None 3. Pain Management: Scheduled Tylenol '650mg'$  for back pain.  Continue Lidoderm patches.  Aquathermia pad. 4. Mood/Behavior/Sleep: LCSW to evaluate and provide emotional support             -antipsychotic agents: n/a 5. Neuropsych/cognition: This patient is capable of making decisions on her own behalf. 6. Skin/Wound Care: Routine skin care checks 7. Fluids/Electrolytes/Nutrition: Strict I's and O's and follow-up chemistries             -Ensure with meals             -change to carb modified diet   12/3 intake remains poor-20-25% at best yesterday -continue trial of megace to boost appetite -recent bmet wnl except for mild CR elevation which has been baseline -12/4 Cr up to 1.8, Bun 32, start low dose IVF 82m/hr NS 8: Paroxysmal atrial fibrillation: s/p ILR on 06/2021; no AC    9: Sinus node dysfunction s/p [employer] pacemaker 01/05/2022; Dr. GBeckie Salts            -subsequent generator removal 11/27             -continue Pacerone 200 mg daily              10: Diastolic dysfunction: Daily weight, monitor electrolytes             -continue Lasix 20 mg  daily -daily weight ordered    11: Diabetes mellitus: CBGs q AC and q HS; carb modified diet             -continue SSI (may be able to  dc when on CM diet)             -at home on Tresiba 6-8 units at bedtime, Novolog 3 units with meals   CBG (last 3)  Recent Labs    02/04/22 1557 02/04/22 2119 02/05/22 0541  GLUCAP 224* 437* 153*     12/3  fair control so far but she's still not eating much   12: Gastroesophageal reflux disease: continue Protonix   13: Hyperlipidemia: follow-up LFTs/restart Lipitor 20 mg daily at discharge   14: Rheumatoid arthritis: continue prednisone 5 mg daily   -12/4 Voltaren gel for L knee pain started  15: Paroxysmal atrial fibrillation: s/p ILR on 06/2021; no AC              -Zio patch placed today   16: Chronic kidney disease: Cr consistently around 1.2-1.3   17: Hypertension: monitor TID and prn             -continue Norvasc 10 mg daily             -continue Lasix 20 mg daily  -12/4 BP low today, continue to hold norvasc and lasix    18: Nausea: will schedule Zofran prior to antibiotic administration   19: Periostitis of lumbar spine without osteomyelitis/MSSA bacteremia:             -Cefazolin 2 grams twice daily through 12/6   20: History of C. difficile colitis: No evidence of recurrent infection             -continue oral vancomycin 125 mg BID while on cefazolin and 1 week thereafter             -continue Florastor 21: Zoster, left posterior hip: continue valacylovir 1000 mg daily   -now just on contact precautions 22. Anemia:  Hgb fluctuating between 8 and 10. Likely d/t chronic disease/CKD  -check Iron panel tomorrow 23. Constipation  -12/4 Small BM today, feels constipated, order sorbitol    LOS: 3 days A FACE TO [place] 02/05/2022, 11:02 AM

## 2022-02-05 NOTE — Care Management (Signed)
[facility] Individual Statement of Services  Patient Name:  Shelby Walker  Date:  02/05/2022  Welcome to the [facility].  Our goal is to provide you with an individualized program based on your diagnosis and situation, designed to meet your specific needs.  With this comprehensive rehabilitation program, you will be expected to participate in at least 3 hours of rehabilitation therapies Monday-Friday, with modified therapy programming on the weekends.  Your rehabilitation program will include the following services:  Physical Therapy (PT), Occupational Therapy (OT), 24 hour per day rehabilitation nursing, Therapeutic Recreaction (TR), Psychology, Neuropsychology, Care Coordinator, Rehabilitation Medicine, [place], and Other  Weekly team conferences will be held on Tuesdays to discuss your progress.  Your Inpatient Rehabilitation Care Coordinator will talk with you frequently to get your input and to update you on team discussions.  Team conferences with you and your family in attendance may also be held.  Expected length of stay: 7-12 days     Overall anticipated outcome: Supervision  Depending on your progress and recovery, your program may change. Your Inpatient Rehabilitation Care Coordinator will coordinate services and will keep you informed of any changes. Your Inpatient Rehabilitation Care Coordinator's name and contact numbers are listed  below.  The following services may also be recommended but are not provided by the [facility] will be made to provide these services after discharge if needed.  Arrangements include referral to agencies that provide these services.  Your insurance has been verified to be:  Medicare A/B  Your primary doctor is:  Reynold Bowen  Pertinent information will be shared with your doctor and your insurance company.  Inpatient Rehabilitation Care Coordinator:  Cathleen Corti [phone] or ([phone]  Information discussed with and copy given to patient by: Rana Snare, 02/05/2022, 9:24 AM

## 2022-02-05 NOTE — Progress Notes (Signed)
Physical Therapy Session Note  Patient Details  Name: Shelby Walker MRN: [id number] Date of Birth: 1933-04-01  Today's Date: 02/05/2022 PT Individual Time: 0900-1005 PT Individual Time Calculation (min): 65 min   Short Term Goals: Week 1:  PT Short Term Goal 1 (Week 1): = LTGs due to ELOS  Skilled Therapeutic Interventions/Progress Updates:    Pt recd sitting EOB with breakfast tray. Pt reports L knee pain with mobility, especially squats/Sit to stand. Pt performed Sit to stand and Stand pivot transfer with CGA throughout session. Donned shoes with assist for time. Pt requesting to go to bathroom, ambulatory transfer with CGA. Supervision for 3/3 toileting tasks. Continent bladder void with very small BM. Pt stood at sink to wash hands with supervision but reports fatigue in standing. Pt stood to don dentures, but required seated rest break d/t fatigue prior to completing task. Therapist retrieved appropriate cushion for w/c and pt stood to place, reports this feels much better. Pt then transported to gym for time. Performed 3 x 10 Sit to stand for endurance and functional mobility. Pt then ambulated ~46f  back to room and was transported remaining distance d/t fatigue. Pt remained in chair and was left with all needs in reach and alarm active.   Therapy Documentation Precautions:  Precautions Precautions: Fall Precaution Comments: watch vitals Restrictions Weight Bearing Restrictions: No General:       Therapy/Group: Individual Therapy  OMickel Fuchs12/06/2021, 9:40 AM

## 2022-02-05 NOTE — Progress Notes (Signed)
Subjective: No new complaints   Antibiotics:  Anti-infectives (From admission, onward)    Start     Dose/Rate Route Frequency Ordered Stop   02/03/22 0800  valACYclovir (VALTREX) tablet 1,000 mg       Note to Pharmacy: Please adjust dose for GFR, has shingles   1,000 mg Oral Daily 02/02/22 1846 02/08/22 2359   02/02/22 2000  ceFAZolin (ANCEF) IVPB 2g/100 mL premix        2 g 200 mL/hr over 30 Minutes Intravenous 2 times daily 02/02/22 1846     02/02/22 2000  vancomycin (VANCOCIN) capsule 125 mg        125 mg Oral 2 times daily 02/02/22 1846         Medications: Scheduled Meds:  acetaminophen  325 mg Oral Q6H   amiodarone  200 mg Oral Daily   Chlorhexidine Gluconate Cloth  6 each Topical Daily   diclofenac Sodium  4 g Topical QID   enoxaparin (LOVENOX) injection  30 mg Subcutaneous Q24H   feeding supplement  237 mL Oral BID WC   insulin aspart  0-9 Units Subcutaneous TID AC & HS   lidocaine  1 patch Transdermal Q24H   magic mouthwash  10 mL Oral QID   megestrol  400 mg Oral BID   multivitamin  1 tablet Oral QHS   naphazoline-pheniramine  1 drop Both Eyes TID   ondansetron (ZOFRAN) IV  4 mg Intravenous BID   pantoprazole  40 mg Oral Daily   predniSONE  5 mg Oral Q breakfast   saccharomyces boulardii  250 mg Oral BID   sodium chloride flush  10-40 mL Intracatheter Q12H   sodium chloride flush  3 mL Intravenous Q12H   sorbitol  45 mL Oral Once   valACYclovir  1,000 mg Oral Daily   vancomycin  125 mg Oral BID   Continuous Infusions:  sodium chloride 50 mL/hr at 02/05/22 1245    ceFAZolin (ANCEF) IV 2 g (02/05/22 0915)   PRN Meds:.alum & mag hydroxide-simeth, diphenhydrAMINE, docusate sodium, guaiFENesin-dextromethorphan, methocarbamol, polyethylene glycol, sodium chloride flush, sodium phosphate, sorbitol, traZODone    Objective: Weight change:   Intake/Output Summary (Last 24 hours) at 02/05/2022 1345 Last data filed at 02/04/2022 1700 Gross per 24  hour  Intake 243.89 ml  Output --  Net 243.89 ml   Blood pressure (!) 96/55, pulse 81, temperature 98.5 F (36.9 C), resp. rate 16, SpO2 97 %. Temp:  [98.4 F (36.9 C)-99.6 F (37.6 C)] 98.5 F (36.9 C) (12/04 0354) Pulse Rate:  [80-94] 81 (12/04 0354) Resp:  [15-20] 16 (12/04 0354) BP: (65-96)/(43-59) 96/55 (12/04 0354) SpO2:  [97 %-100 %] 97 % (12/04 0354)  Physical Exam: Physical Exam Constitutional:      General: She is not in acute distress.    Appearance: She is well-developed. She is not diaphoretic.  HENT:     Head: Normocephalic and atraumatic.     Right Ear: External ear normal.     Left Ear: External ear normal.     Mouth/Throat:     Pharynx: No oropharyngeal exudate.  Eyes:     General: No scleral icterus.    Conjunctiva/sclera: Conjunctivae normal.     Pupils: Pupils are equal, round, and reactive to light.  Cardiovascular:     Rate and Rhythm: Normal rate and regular rhythm.     Heart sounds: Normal heart sounds. No murmur heard. Pulmonary:     Effort: Pulmonary effort is  normal. No respiratory distress.     Breath sounds: Normal breath sounds. No wheezing or rales.  Abdominal:     General: There is no distension.     Palpations: Abdomen is soft.  Musculoskeletal:        General: No tenderness. Normal range of motion.  Lymphadenopathy:     Cervical: No cervical adenopathy.  Skin:    General: Skin is warm and dry.     Coloration: Skin is not pale.     Findings: No erythema or rash.  Neurological:     General: No focal deficit present.     Mental Status: She is alert and oriented to person, place, and time.     Motor: No abnormal muscle tone.  Psychiatric:        Mood and Affect: Mood normal.        Behavior: Behavior normal.        Thought Content: Thought content normal.        Judgment: Judgment normal.      CBC:    BMET Recent Labs    02/03/22 0312 02/05/22 0415  NA 140 136  K 3.8 4.1  CL 101 100  CO2 28 27  GLUCOSE 108* 166*   BUN 18 32*  CREATININE 1.36* 1.80*  CALCIUM 8.4* 7.7*     Liver Panel  No results for input(s): "PROT", "ALBUMIN", "AST", "ALT", "ALKPHOS", "BILITOT", "BILIDIR", "IBILI" in the last 72 hours.     Sedimentation Rate No results for input(s): "ESRSEDRATE" in the last 72 hours. C-Reactive Protein No results for input(s): "CRP" in the last 72 hours.  Micro Results: Recent Results (from the past 720 hour(s))  Resp Panel by RT-PCR (Flu A&B, Covid) Anterior Nasal Swab     Status: None   Collection Time: 01/24/22  9:23 AM   Specimen: Anterior Nasal Swab  Result Value Ref Range Status   SARS Coronavirus 2 by RT PCR NEGATIVE NEGATIVE Final    Comment: (NOTE) SARS-CoV-2 target nucleic acids are NOT DETECTED.  The SARS-CoV-2 RNA is generally detectable in upper respiratory specimens during the acute phase of infection. The lowest concentration of SARS-CoV-2 viral copies this assay can detect is 138 copies/mL. A negative result does not preclude SARS-Cov-2 infection and should not be used as the sole basis for treatment or other patient management decisions. A negative result may occur with  improper specimen collection/handling, submission of specimen other than nasopharyngeal swab, presence of viral mutation(s) within the areas targeted by this assay, and inadequate number of viral copies(<138 copies/mL). A negative result must be combined with clinical observations, patient history, and epidemiological information. The expected result is Negative.  Fact Sheet for Patients:  EntrepreneurPulse.com.au  Fact Sheet for Healthcare Providers:  IncredibleEmployment.be  This test is no t yet approved or cleared by the [place] FDA and  has been authorized for detection and/or diagnosis of SARS-CoV-2 by FDA under an Emergency Use Authorization (EUA). This EUA will remain  in effect (meaning this test can be used) for the duration of the COVID-19  declaration under Section 564(b)(1) of the Act, 21 U.S.C.section 360bbb-3(b)(1), unless the authorization is terminated  or revoked sooner.       Influenza A by PCR NEGATIVE NEGATIVE Final   Influenza B by PCR NEGATIVE NEGATIVE Final    Comment: (NOTE) The Xpert Xpress SARS-CoV-2/FLU/RSV plus assay is intended as an aid in the diagnosis of influenza from Nasopharyngeal swab specimens and should not be used as a sole basis  for treatment. Nasal washings and aspirates are unacceptable for Xpert Xpress SARS-CoV-2/FLU/RSV testing.  Fact Sheet for Patients: EntrepreneurPulse.com.au  Fact Sheet for Healthcare Providers: IncredibleEmployment.be  This test is not yet approved or cleared by the [place] FDA and has been authorized for detection and/or diagnosis of SARS-CoV-2 by FDA under an Emergency Use Authorization (EUA). This EUA will remain in effect (meaning this test can be used) for the duration of the COVID-19 declaration under Section 564(b)(1) of the Act, 21 U.S.C. section 360bbb-3(b)(1), unless the authorization is terminated or revoked.  Performed at [facility], [place] [address] [place], [place] [address]   Blood Culture (routine x 2)     Status: Abnormal   Collection Time: 01/24/22  9:23 AM   Specimen: BLOOD  Result Value Ref Range Status   Specimen Description BLOOD SITE NOT SPECIFIED  Final   Special Requests   Final    BOTTLES DRAWN AEROBIC AND ANAEROBIC Blood Culture adequate volume   Culture  Setup Time   Final    GRAM POSITIVE COCCI IN PAIRS IN CLUSTERS IN BOTH AEROBIC AND ANAEROBIC BOTTLES CRITICAL RESULT CALLED TO, READ BACK BY AND VERIFIED WITH: T RUDISILL,PHARMD'@0004'$  01/25/22 [place] Performed at [facility], [place] [address] [place], [place] [address]    Culture STAPHYLOCOCCUS AUREUS (A)  Final   Report Status 01/26/2022 FINAL  Final   Organism ID, Bacteria STAPHYLOCOCCUS AUREUS  Final      Susceptibility    Staphylococcus aureus - MIC*    CIPROFLOXACIN <=0.5 SENSITIVE Sensitive     ERYTHROMYCIN <=0.25 SENSITIVE Sensitive     GENTAMICIN <=0.5 SENSITIVE Sensitive     OXACILLIN 0.5 SENSITIVE Sensitive     TETRACYCLINE <=1 SENSITIVE Sensitive     VANCOMYCIN <=0.5 SENSITIVE Sensitive     TRIMETH/SULFA <=10 SENSITIVE Sensitive     CLINDAMYCIN <=0.25 SENSITIVE Sensitive     RIFAMPIN <=0.5 SENSITIVE Sensitive     Inducible Clindamycin NEGATIVE Sensitive     * STAPHYLOCOCCUS AUREUS  Urine Culture     Status: Abnormal   Collection Time: 01/24/22  9:23 AM   Specimen: In/Out Cath Urine  Result Value Ref Range Status   Specimen Description IN/OUT CATH URINE  Final   Special Requests   Final    NONE Performed at [facility], [place] [address] [place], [place] [address]    Culture (A)  Final    40,000 COLONIES/mL ESCHERICHIA COLI 40,000 COLONIES/mL PSEUDOMONAS AERUGINOSA    Report Status 01/26/2022 FINAL  Final   Organism ID, Bacteria ESCHERICHIA COLI (A)  Final   Organism ID, Bacteria PSEUDOMONAS AERUGINOSA (A)  Final      Susceptibility   Escherichia coli - MIC*    AMPICILLIN 16 INTERMEDIATE Intermediate     CEFAZOLIN <=4 SENSITIVE Sensitive     CEFEPIME <=0.12 SENSITIVE Sensitive     CEFTRIAXONE <=0.25 SENSITIVE Sensitive     CIPROFLOXACIN <=0.25 SENSITIVE Sensitive     GENTAMICIN <=1 SENSITIVE Sensitive     IMIPENEM <=0.25 SENSITIVE Sensitive     NITROFURANTOIN 32 SENSITIVE Sensitive     TRIMETH/SULFA <=20 SENSITIVE Sensitive     AMPICILLIN/SULBACTAM 4 SENSITIVE Sensitive     PIP/TAZO <=4 SENSITIVE Sensitive     * 40,000 COLONIES/mL ESCHERICHIA COLI   Pseudomonas aeruginosa - MIC*    CEFTAZIDIME 2 SENSITIVE Sensitive     CIPROFLOXACIN 0.5 SENSITIVE Sensitive     GENTAMICIN <=1 SENSITIVE Sensitive     IMIPENEM 2 SENSITIVE Sensitive  PIP/TAZO 8 SENSITIVE Sensitive     CEFEPIME 4 SENSITIVE Sensitive     * 40,000 COLONIES/mL PSEUDOMONAS AERUGINOSA  Blood Culture ID Panel  (Reflexed)     Status: Abnormal   Collection Time: 01/24/22  9:23 AM  Result Value Ref Range Status   Enterococcus faecalis NOT DETECTED NOT DETECTED Final   Enterococcus Faecium NOT DETECTED NOT DETECTED Final   Listeria monocytogenes NOT DETECTED NOT DETECTED Final   Staphylococcus species DETECTED (A) NOT DETECTED Final    Comment: CRITICAL RESULT CALLED TO, READ BACK BY AND VERIFIED WITH: T RUDISILL,PHARMD'@0004'$  01/25/22 [place]    Staphylococcus aureus (BCID) DETECTED (A) NOT DETECTED Final    Comment: CRITICAL RESULT CALLED TO, READ BACK BY AND VERIFIED WITH: T RUDISILL,PHARMD'@0004'$  01/25/22 [place]    Staphylococcus epidermidis NOT DETECTED NOT DETECTED Final   Staphylococcus lugdunensis NOT DETECTED NOT DETECTED Final   Streptococcus species NOT DETECTED NOT DETECTED Final   Streptococcus agalactiae NOT DETECTED NOT DETECTED Final   Streptococcus pneumoniae NOT DETECTED NOT DETECTED Final   Streptococcus pyogenes NOT DETECTED NOT DETECTED Final   A.calcoaceticus-baumannii NOT DETECTED NOT DETECTED Final   Bacteroides fragilis NOT DETECTED NOT DETECTED Final   Enterobacterales NOT DETECTED NOT DETECTED Final   Enterobacter cloacae complex NOT DETECTED NOT DETECTED Final   Escherichia coli NOT DETECTED NOT DETECTED Final   Klebsiella aerogenes NOT DETECTED NOT DETECTED Final   Klebsiella oxytoca NOT DETECTED NOT DETECTED Final   Klebsiella pneumoniae NOT DETECTED NOT DETECTED Final   Proteus species NOT DETECTED NOT DETECTED Final   Salmonella species NOT DETECTED NOT DETECTED Final   Serratia marcescens NOT DETECTED NOT DETECTED Final   Haemophilus influenzae NOT DETECTED NOT DETECTED Final   Neisseria meningitidis NOT DETECTED NOT DETECTED Final   Pseudomonas aeruginosa NOT DETECTED NOT DETECTED Final   Stenotrophomonas maltophilia NOT DETECTED NOT DETECTED Final   Candida albicans NOT DETECTED NOT DETECTED Final   Candida auris NOT DETECTED NOT DETECTED Final   Candida glabrata NOT  DETECTED NOT DETECTED Final   Candida krusei NOT DETECTED NOT DETECTED Final   Candida parapsilosis NOT DETECTED NOT DETECTED Final   Candida tropicalis NOT DETECTED NOT DETECTED Final   Cryptococcus neoformans/gattii NOT DETECTED NOT DETECTED Final   Meth resistant mecA/C and MREJ NOT DETECTED NOT DETECTED Final    Comment: Performed at [facility], [address] [place], [place] [address]  Blood Culture (routine x 2)     Status: Abnormal   Collection Time: 01/24/22  9:28 AM   Specimen: BLOOD  Result Value Ref Range Status   Specimen Description BLOOD SITE NOT SPECIFIED  Final   Special Requests   Final    BOTTLES DRAWN AEROBIC AND ANAEROBIC Blood Culture results may not be optimal due to an excessive volume of blood received in culture bottles   Culture  Setup Time   Final    GRAM POSITIVE COCCI IN PAIRS IN BOTH AEROBIC AND ANAEROBIC BOTTLES CRITICAL VALUE NOTED.  VALUE IS CONSISTENT WITH PREVIOUSLY REPORTED AND CALLED VALUE.    Culture (A)  Final    STAPHYLOCOCCUS AUREUS SUSCEPTIBILITIES PERFORMED ON PREVIOUS CULTURE WITHIN THE LAST 5 DAYS. Performed at [facility], [place] [address] [place], [place] [address]    Report Status 01/26/2022 FINAL  Final  Gastrointestinal Panel by PCR , Stool     Status: None   Collection Time: 01/24/22 10:36 AM   Specimen: Stool  Result Value Ref Range Status   Campylobacter species  NOT DETECTED NOT DETECTED Final   Plesimonas shigelloides NOT DETECTED NOT DETECTED Final   Salmonella species NOT DETECTED NOT DETECTED Final   Yersinia enterocolitica NOT DETECTED NOT DETECTED Final   Vibrio species NOT DETECTED NOT DETECTED Final   Vibrio cholerae NOT DETECTED NOT DETECTED Final   Enteroaggregative E coli (EAEC) NOT DETECTED NOT DETECTED Final   Enteropathogenic E coli (EPEC) NOT DETECTED NOT DETECTED Final   Enterotoxigenic E coli (ETEC) NOT DETECTED NOT DETECTED Final   Shiga like toxin producing E coli (STEC) NOT DETECTED NOT  DETECTED Final   Shigella/Enteroinvasive E coli (EIEC) NOT DETECTED NOT DETECTED Final   Cryptosporidium NOT DETECTED NOT DETECTED Final   Cyclospora cayetanensis NOT DETECTED NOT DETECTED Final   Entamoeba histolytica NOT DETECTED NOT DETECTED Final   Giardia lamblia NOT DETECTED NOT DETECTED Final   Adenovirus F40/41 NOT DETECTED NOT DETECTED Final   Astrovirus NOT DETECTED NOT DETECTED Final   Norovirus GI/GII NOT DETECTED NOT DETECTED Final   Rotavirus A NOT DETECTED NOT DETECTED Final   Sapovirus (I, II, IV, and V) NOT DETECTED NOT DETECTED Final    Comment: Performed at [facility], [place] [place], [place] [address]  MRSA Next Gen by PCR, Nasal     Status: None   Collection Time: 01/24/22 11:00 PM   Specimen: Nasal Mucosa; Nasal Swab  Result Value Ref Range Status   MRSA by PCR Next Gen NOT DETECTED NOT DETECTED Final    Comment: (NOTE) The GeneXpert MRSA Assay (FDA approved for NASAL specimens only), is one component of a comprehensive MRSA colonization surveillance program. It is not intended to diagnose MRSA infection nor to guide or monitor treatment for MRSA infections. Test performance is not FDA approved in patients less than 50 years old. Performed at [facility], [place] [address], [place], [place] [address]   C Difficile Quick Screen w PCR reflex     Status: None   Collection Time: 01/25/22  5:14 AM   Specimen: STOOL  Result Value Ref Range Status   C Diff antigen NEGATIVE NEGATIVE Final   C Diff toxin NEGATIVE NEGATIVE Final   C Diff interpretation No C. difficile detected.  Final    Comment: Performed at [facility], [place] [address] [place], [place] [address]  Culture, blood (Routine X 2) w Reflex to ID Panel     Status: None   Collection Time: 01/25/22  2:56 PM   Specimen: BLOOD  Result Value Ref Range Status   Specimen Description BLOOD SITE NOT SPECIFIED  Final   Special Requests   Final    BOTTLES DRAWN AEROBIC AND ANAEROBIC  Blood Culture adequate volume   Culture   Final    NO GROWTH 5 DAYS Performed at [facility], [address] [place], [place] [address]    Report Status 01/30/2022 FINAL  Final  Culture, blood (Routine X 2) w Reflex to ID Panel     Status: None   Collection Time: 01/25/22  2:56 PM   Specimen: BLOOD  Result Value Ref Range Status   Specimen Description BLOOD SITE NOT SPECIFIED  Final   Special Requests   Final    BOTTLES DRAWN AEROBIC AND ANAEROBIC Blood Culture adequate volume   Culture   Final    NO GROWTH 5 DAYS Performed at [facility], [place] [address], [place], [place] [address]    Report Status 01/30/2022 FINAL  Final  Studies/Results: No results found.    Assessment/Plan:  INTERVAL HISTORY:  patient continuing to work with CIR   Principal Problem:   Severe sepsis with acute organ dysfunction due to Gram positive bacteria ([place])    ASHIAH KARPOWICZ is a 88 y.o. female admitted with MSSA bacteremia and severe sepsis with septic shock with pacemaker involvement status post removal of pacemaker for the 27th 2023.  Transesophageal echocardiogram done at the time of pacemaker removal did not show evidence of vegetations  Good clear bacteremia.  PICC line was placed on the 29th.  She was also found to have possible L3-L4 discitis vertebral osteomyelitis as well as some edema in the left psoas muscle.  In the interim she developed varicella-zoster outbreak.  She has also prior C. difficile colitis history and is on prophylactic vancomycin.  #1 MSSA bacteremia with sepsis and septic shock with pacemaker definition infected status post removal of pacemaker also with possible discitis vertebral osteomyelitis and psoas muscle infection.  Given we with an intracardiac device infection I would PREFER to give her 6 weeks of IV cefazolin from date of Pacemaker removal thru January 7th, 2024  I also think she should have repeat imaging prior to stopping IV  antibiotics in case she has a psoas abscess that was apparent later on imaging.  #2 Zoster: complete course of valtrex   #3 C difficile colitis: I am supportive of continued prophylactic vancomycin  I spent 52 minutes with the patient including than 50% of the time in face to face counseling of the patient regarding her MSSA bacteremia pacemaker infection zoster infection C. difficile colitis, personally reviewing of the thoracic and lumbar spine along with review of medical records in preparation for the visit and during the visit and in coordination of her care.      LOS: 3 days   Alcide Evener 02/05/2022, 1:45 PM

## 2022-02-05 NOTE — Progress Notes (Signed)
Inpatient Rehabilitation Care Coordinator Assessment and Plan Patient Details  Name: Shelby [place] MRN: [id number] Date of Birth: May 12, 1933  Today's Date: 02/05/2022  Hospital Problems: Principal Problem:   Severe sepsis with acute organ dysfunction due to Gram positive bacteria [facility]) Active Problems:   Discitis of lumbar region   Herpes zoster without complication   Clostridium difficile colitis  Past Medical History:  Past Medical History:  Diagnosis Date   Bruit    Carotid Doppler showed no significant abnormality     9per patient)   C. difficile colitis 07/2018   with severe sepsis   Chest pain, unspecified    Nuclear, May, 2008, no scar or ischemia   Diabetes mellitus    Diverticulosis    Dyslipidemia    Ejection fraction    EF 55-60%, echo, February, 2011 // Echocardiogram 8/21: EF 60-65, no RWMA, Gr 1 DD, GLS -14%, normal RVSF, mild LAE, trivial MR, mild MS (mean 4 mmHg), RVSP 23.4    GERD (gastroesophageal reflux disease)    Mitral regurgitation 04/21/2009   mild,  echo, February, 2011   Osteoporosis    Palpitations    possible very brief atrial fibrillation on monitor and possible reentrant tachycardia   Psoriasis    Rheumatoid arthritis ([place])    Past Surgical History:  Past Surgical History:  Procedure Laterality Date   ABDOMINAL AORTOGRAM W/LOWER EXTREMITY Right 10/19/2020   Procedure: ABDOMINAL AORTOGRAM W/LOWER EXTREMITY;  Surgeon: Wellington Hampshire, MD;  Location: [place] [facility];  Service: Cardiovascular;  Laterality: Right;   FRACTURE SURGERY     LOOP RECORDER REMOVAL N/A 01/05/2022   Procedure: LOOP RECORDER REMOVAL;  Surgeon: Evans Lance, MD;  Location: [place] [facility];  Service: Cardiovascular;  Laterality: N/A;   PACEMAKER IMPLANT N/A 01/05/2022   Procedure: PACEMAKER IMPLANT;  Surgeon: Evans Lance, MD;  Location: [place] [facility];  Service: Cardiovascular;  Laterality: N/A;   PPM GENERATOR REMOVAL N/A 01/29/2022   Procedure: PPM  GENERATOR REMOVAL;  Surgeon: Evans Lance, MD;  Location: [place] [facility];  Service: Cardiovascular;  Laterality: N/A;   TEE WITHOUT CARDIOVERSION N/A 01/29/2022   Procedure: TRANSESOPHAGEAL ECHOCARDIOGRAM (TEE);  Surgeon: Geralynn Rile, MD;  Location: [place];  Service: Cardiovascular;  Laterality: N/A;   TRANSMETATARSAL AMPUTATION Right 01/18/2021   Procedure: TRANSMETATARSAL AMPUTATION;  Surgeon: Wylene Simmer, MD;  Location: [place];  Service: Orthopedics;  Laterality: Right;   Social History:  reports that she has never smoked. She has never used smokeless tobacco. She reports that she does not drink alcohol and does not use drugs.  Family / Support Systems Marital Status: Widow/Widower Patient Roles: Parent Spouse/Significant Other: Widowed Children: [name] (dtr) [phone]; Marlou Sa (son) [phone] Other Supports: family support Anticipated Caregiver: Children and other family members will check in PRN Ability/Limitations of Caregiver: Pt dtr Shelby [place] reports that all family can come by and check in on her. Caregiver Availability: Intermittent Family Dynamics: Pt lives alone, with support with some meals and finances.  Social History Preferred language: English Religion: Methodist Education: Psychologist, occupational - How often do you need to have someone help you when you read instructions, pamphlets, or other written material from your doctor or pharmacy?: Never Writes: Yes Employment Status: Retired Public relations account executive Issues: Denies Guardian/Conservator: N/A   Abuse/Neglect Abuse/Neglect Assessment Can Be Completed: Yes Physical Abuse: Denies Verbal Abuse: Denies Sexual Abuse: Denies Exploitation of patient/patient's resources: Denies Self-Neglect: Denies  Patient response to: Social Isolation -  How often do you feel lonely or isolated from those around you?: Never  Emotional Status Pt's affect, behavior and adjustment status: Pt  in good spirits at time of visit. Would like ot go home earlier but "ok," with staying here if needed. Recent Psychosocial Issues: Denies Psychiatric History: Denies Substance Abuse History: Denies  Patient / Family Perceptions, Expectations & Goals Pt/Family understanding of illness & functional limitations: Pt family has a general understanding of care needs Premorbid pt/family roles/activities: Independent Anticipated changes in roles/activities/participation: Assitstance with ADLs/IADLs Pt/family expectations/goals: Per pt dtr- IF 24/7 care is required, it can be discussed about support to put in place. She would like for pt to remain here for full length of time recommended and not leave this week to allow her to have recovery.  Community Resources [employer]: None Premorbid Home Care/DME Agencies: None Transportation available at discharge: TBD Is the patient able to respond to transportation needs?: Yes In the past 12 months, has lack of transportation kept you from medical appointments or from getting medications?: No In the past 12 months, has lack of transportation kept you from meetings, work, or from getting things needed for daily living?: No Resource referrals recommended: Neuropsychology  Discharge Planning Living Arrangements: Children, Other relatives, Non-relatives/Friends Support Systems: Children, Other relatives, Friends/neighbors Type of Residence: Private residence Insurance Resources: [employer] Financial Resources: [place] Referred: No Living Expenses: Own Money Management: Family Does the patient have any problems obtaining your medications?: No Home Management: Pt prepares small meals like boiling an egg. Otherwise  family prepares meals, and helps with housecleaning as needed. Patient/Family Preliminary Plans: TBD Care Coordinator Barriers to Discharge: Decreased caregiver support, Lack of/limited family support Care  Coordinator Anticipated Follow Up Needs: HH/OP  Clinical Impression  943am- SW spoke with pt dt Shelby [place] to introduce self, explain role,and discuss discharge process.  SW informed on ELOS, and scheduled family education for Wednesday (12/6) 9:30am-12pm with her dtr and her husband.   Alexia Dinger A Kevante Lunt 02/05/2022, 2:25 PM

## 2022-02-05 NOTE — IPOC Note (Signed)
Overall Plan of Care [facility]) Patient Details Name: Shelby [place] MRN: [id number] DOB: 03-17-1933  Admitting Diagnosis: Severe sepsis with acute organ dysfunction due to Gram positive bacteria [facility])  Hospital Problems: Principal Problem:   Severe sepsis with acute organ dysfunction due to Gram positive bacteria (HCC) Active Problems:   Discitis of lumbar region   Herpes zoster without complication   Clostridium difficile colitis     Functional Problem List: Nursing Pain, Bladder, Bowel, Safety, Edema, Sensory, Endurance, Skin Integrity, Medication Management, Motor  PT Balance, Pain, Endurance, Safety  OT Balance, Pain, Endurance  SLP    TR         Basic ADL's: OT Grooming, Bathing, Dressing, Toileting     Advanced  ADL's: OT Light Housekeeping, Simple Meal Preparation, Laundry     Transfers: PT Bed Mobility, Bed to Chair, Musician, Manufacturing systems engineer, Metallurgist: PT Ambulation, Emergency planning/management officer, Stairs     Additional Impairments: OT None  SLP        TR      Anticipated Outcomes Item Anticipated Outcome  Self Feeding    Swallowing      Basic self-care  Media planner Transfers Supervision  Bowel/Bladder  continent x 2  Transfers  S basic and car  Locomotion  S gait x 150' with LRAD controlled and community setting, S gait x 50' with LRAD home setting; S up/down 4 steps 1 rail,  Communication     Cognition     Pain  less than 4  Safety/Judgment  remain fall free while in rehab   Therapy Plan: PT Intensity: Minimum of 1-2 x/day ,45 to 90 minutes PT Frequency: 5 out of 7 days PT Duration Estimated Length of Stay: 10- 12 days OT Intensity: Minimum of 1-2 x/day, 45 to 90 minutes OT Frequency: 5 out of 7 days OT Duration/Estimated Length of Stay: 7-10 days     Team Interventions: Nursing Interventions Patient/Family Education, Disease Management/Prevention, Skin Care/Wound Management, Discharge  Planning, Bladder Management, Pain Management, Bowel Management, Medication Management  PT interventions Ambulation/gait training, Cognitive remediation/compensation, Discharge planning, DME/adaptive equipment instruction, Functional mobility training, Pain management, Splinting/orthotics, Therapeutic Activities, Psychosocial support, UE/LE Strength taining/ROM, Training and development officer, Community reintegration, Neuromuscular re-education, Barrister's clerk education, IT trainer, Therapeutic Exercise, UE/LE Coordination activities, Wheelchair propulsion/positioning  OT Interventions Training and development officer, Discharge planning, Pain management, Self Care/advanced ADL retraining, Therapeutic Activities, UE/LE Coordination activities, Functional mobility training, Patient/family education, Therapeutic Exercise, Psychosocial support, UE/LE Strength taining/ROM, DME/adaptive equipment instruction, Community reintegration  SLP Interventions    TR Interventions    SW/CM Interventions Discharge Planning, Psychosocial Support, Patient/Family Education   Barriers to Discharge MD  Medical stability, Home enviroment access/loayout, and IV antibiotics  Nursing Decreased caregiver support, Home environment access/layout, Incontinence, Wound Care, Lack of/limited family support home alone house with 2 ste rails on left.  PT      OT      SLP      SW Decreased caregiver support, Lack of/limited family support     Team Discharge Planning: Destination: PT-Home ,OT- Home , SLP-  Projected Follow-up: PT-Home health PT, OT-  Home health OT, 24 hour supervision/assistance, SLP-  Projected Equipment Needs: PT- , OT- None recommended by OT, SLP-  Equipment Details: PT-pt owns RW, OT-  Patient/family involved in discharge planning: PT- Patient, Family member/caregiver,  OT-Patient, Family member/caregiver, SLP-   MD ELOS: 8-12 days Medical Rehab Prognosis:  Excellent Assessment: The patient has been  admitted for CIR therapies with the diagnosis of debility. The team will be addressing functional mobility, strength, stamina, balance, safety, adaptive techniques and equipment, self-care, bowel and bladder mgt, patient and caregiver education. Goals have been set at supervision. Anticipated discharge destination is home.        See Team Conference Notes for weekly updates to the plan of care

## 2022-02-06 DIAGNOSIS — T827XXS Infection and inflammatory reaction due to other cardiac and vascular devices, implants and grafts, sequela: Secondary | ICD-10-CM

## 2022-02-06 LAB — BASIC METABOLIC PANEL
Anion gap: 7 (ref 5–15)
BUN: 26 mg/dL — ABNORMAL HIGH (ref 8–23)
CO2: 26 mmol/L (ref 22–32)
Calcium: 7.8 mg/dL — ABNORMAL LOW (ref 8.9–10.3)
Chloride: 108 mmol/L (ref 98–111)
Creatinine, Ser: 1.68 mg/dL — ABNORMAL HIGH (ref 0.44–1.00)
GFR, Estimated: 29 mL/min — ABNORMAL LOW (ref >60)
Glucose, Bld: 115 mg/dL — ABNORMAL HIGH (ref 70–99)
Potassium: 3.9 mmol/L (ref 3.5–5.1)
Sodium: 141 mmol/L (ref 135–145)

## 2022-02-06 LAB — GLUCOSE, CAPILLARY
Glucose-Capillary: 100 mg/dL — ABNORMAL HIGH (ref 70–99)
Glucose-Capillary: 254 mg/dL — ABNORMAL HIGH (ref 70–99)
Glucose-Capillary: 279 mg/dL — ABNORMAL HIGH (ref 70–99)
Glucose-Capillary: 140 mg/dL — ABNORMAL HIGH (ref 70–99)

## 2022-02-06 MED ORDER — CHLORHEXIDINE GLUCONATE CLOTH 2 % EX PADS
6.0000 | MEDICATED_PAD | Freq: Two times a day (BID) | CUTANEOUS | Status: DC
  Administered 2022-02-06 – 2022-02-15 (×18): 6 via TOPICAL

## 2022-02-06 MED ORDER — INSULIN ASPART 100 UNIT/ML IJ SOLN
2.0000 [IU] | Freq: Three times a day (TID) | INTRAMUSCULAR | Status: DC
  Administered 2022-02-06 – 2022-02-08 (×6): 2 [IU] via SUBCUTANEOUS

## 2022-02-06 NOTE — Progress Notes (Addendum)
Patient ID: LATREASE KUNDE, female   DOB: April 25, 1933, 88 y.o.   MRN: [id number]  [id number] spoke with pt dtr [name] to provide updates from team conference, and d/c date 12/14. SW confirmed that pt will d/c to home on IV abx until 03/11/22. SW discussed home infusion agency preference. No agency preference. For therapies, family prefers [facility]. SW will share her preference with the infusion agency.  Reports her mother has a RW, rollator, TTB, and canes. States in the home she uses the RW and in the community uses rollator.   SW spoke with Pam Chandler/[facility] to place referral for review. SW shared preference on [facility] being [facility]. Informed SW that there is a $20 per diem per day for supplies that are not covered. States [facility] and [facility] pay for the per diem instead. SW will follow-up after speaking with the family.   SW spoke with pt dtr [name] to inform on above. States she will speak with her brother and will follow-up with [place], MSW, [place] Office: [phone] Cell: [phone] Fax: [phone]

## 2022-02-06 NOTE — Progress Notes (Signed)
Physical Therapy Session Note  Patient Details  Name: Shelby [place] MRN: [id number] Date of Birth: 09-Aug-1933  Today's Date: 02/06/2022 PT Individual Time: 1005-1058, 1415-1530 PT Individual Time Calculation (min): 53 min, 75 min   Short Term Goals: Week 1:  PT Short Term Goal 1 (Week 1): = LTGs due to ELOS  Skilled Therapeutic Interventions/Progress Updates:    Session x 1: Pt seated in w/c on arrival and agreeable to therapy. Pt reports chronic back and knee pain, premedicated. Rest and positioning provided as needed. Pt transported to therapy gym for time management and energy conservation. Pt performed step taps on 4" step for balance and endurance, but reports this feels easy. Progressed to cone taps for increased balance challenge, UE supported on RW and with supervision, 2 x 20. Pt then ambulated 3 x 170 ft. Cues for upright posture and RW proximity. Pt with antalgic gait d/t L knee pain. Discussed energy conservation principles and appropriate rest to complete tasks. Extended rest breaks d/t fatigue. W/c pulled behind for last walk for safety d/t fatigue. Pt propelled w/c with all 4 extremities back to room, >150 ft with short rest breaks for UE fatigue. Pt performed modified Stand pivot transfer with HHA and sit>supine with supervision to flat bed, was left with all needs in reach and alarm active.   Session 2: Pt seated in w/c on arrival and agreeable to therapy at end of OT session. Pt reports L knee pain with mobility, premedicated. Rest and positioning provided as needed. Pt noted to have poor short term memory, repeating questions she asked in previous session. Pt transported to therapy gym for time management and energy conservation. Pt participated in obstacle course x 2 with seated rest break, consisting of compliant surface x 2, hurdles x 2, and weaving through 2 cones. Pt demoed poor safety awareness on hurdles, attempting to step through before pushing walker over and required cues  to recall what to do with cones. Upon sitting, pt reports dizziness. BP=110/74 (86). Seated rest break for several minutes to recover. Pt participated in standing corn hole for dynamic balance and activity tolerance. Pt able to retrieve bean bags from board with CGA and RW, demoing good safety. CGA fading to supervision for balance while throwing. Pt ambulated to nustep with supervision, requiring increased cues for RW proximity. Utilized nustep 2 x 5 min at level 2 for global strength and endurance. At 3 min, pt instructed to stop at 5 min mark, pt able to recall and follow cue. Pt ambulated back to chair in same manner, x 10 ft. Pt states she is too tired to perform any more activity. Pt transported back to room and remained in w/c, was left with all needs in reach and alarm active. On return pt's daughter present and with questions about family education. Therapist provided education on plan family ed and time of therapy sessions so pt's son can attend most appropriate sessions.   Therapy Documentation Precautions:  Precautions Precautions: Fall Precaution Comments: watch vitals Restrictions Weight Bearing Restrictions: No General:   Vital Signs:  Pain: Pain Assessment Pain Scale: 0-10 Pain Score: 0-No pain Mobility:   Locomotion :    Trunk/Postural Assessment :    Balance:   Exercises:   Other Treatments:      Therapy/Group: Individual Therapy  Mickel Fuchs 02/06/2022, 10:30 AM

## 2022-02-06 NOTE — Progress Notes (Signed)
Occupational Therapy Session Note  Patient Details  Name: Shelby [place] MRN: [id number] Date of Birth: 1933-12-27  Today's Date: 02/06/2022 OT Individual Time: 0762-2633 OT Individual Time Calculation (min): 41 min    Short Term Goals: Week 1:  OT Short Term Goal 1 (Week 1): STG=LTG 2/2 ELOS  Skilled Therapeutic Interventions/Progress Updates:  Pt seen for skilled OT services with focus on shower retraining. Pt completing toileting with NT upon OT arrival. Pt able to amb with RW with min A /CGA to stall shower to TTB. OT covered PICC site and L chest wall sites with Shower Screens for waterproofing, set up items, water management and assisted with hair washing and reaching buttocks and feet, otherwise pt required CGA but mod A overall. Pt amb from stall shower to sink side w/c set up for dressing with CGA. Pt able to complete hair combing and lotion application with set up, UB dressing with CGA and LB dressing with overall mod a, crossing LE's up but will benefit from LB AE training and recs for d/c as pt reported "I used to do all this on my own". Pt left in the care of PT who arrived for next session.   Therapy Documentation Precautions:  Precautions Precautions: Fall Precaution Comments: watch vitals Restrictions Weight Bearing Restrictions: No    Therapy/Group: Individual Therapy  Barnabas Lister 02/06/2022, 2:13 PM

## 2022-02-06 NOTE — Progress Notes (Signed)
#  1 MSSA bacteremia with sepsis and septic shock with pacemaker definition infected status post removal of pacemaker also with possible discitis vertebral osteomyelitis and psoas muscle infection.   Given we with an intracardiac device infection I would PREFER to give her 6 weeks of IV cefazolin from date of Pacemaker removal thru January 7th, 2024   I also think she should have repeat imaging prior to stopping IV antibiotics in case she has a psoas abscess that was apparent later on imaging.   Diagnosis: MSSA bacteremia pacemaker infection and discitis vertebral osteomyelitis  Culture Result: MSSA  Allergies  Allergen Reactions   Cholestyramine     Possible- Makes throat burn. Patient said she can't take it    Compazine [Prochlorperazine Edisylate] Swelling    REACTION: " tongue swell and unable to swallow"   Sulfonamide Derivatives Other (See Comments)    REACTION: " broke out with fine itching bumps"   Hydrocodone Other (See Comments)   Infliximab Other (See Comments)    Other reaction(s): rash   Leflunomide     Other reaction(s): diarrhea   Prochlorperazine Other (See Comments)    Other reaction(s): Unknown   Rosuvastatin     Other reaction(s): muscle aches   Sulfa Antibiotics Other (See Comments)    Other reaction(s): tongue swelling    OPAT Orders Discharge antibiotics to be given via PICC line Discharge antibiotics: Cefazolin 2 g IV every 12 hours  Duration: 6 weeks End Date: 03/11/22    [facility] Care Per Protocol:  Home health RN for IV administration and teaching; PICC line care and labs.    Labs weekly while on IV antibiotics: _c_ CBC with differential _c_ BMP  x__ CRP _x_ ESR   x__ Please pull PIC at completion of IV antibiotics __ Please leave PIC in place until doctor has seen patient or been notified  Fax weekly labs to [phone]     #2 Zoster: complete course of valtrex     #3 C difficile colitis: I am supportive of continued  prophylactic vancomycin     Candelaria Stagers Karras has an appointment on 03/01/22 at 69 with Dr. Gale Journey at   [facility] for Infectious Disease, which  is located in the [facility] at  [address] in [place].  [address], which is located to the left of the elevators.  Phone: [phone]  Fax: [phone]  [email or web address]  The patient should arrive 30 minutes prior to their appoitment.  I will sign off for now  Please call with further questions.   Rhina Brackett Dam 02/06/2022, 3:07 PM

## 2022-02-06 NOTE — Progress Notes (Signed)
Occupational Therapy Session Note  Patient Details  Name: Shelby [place] MRN: [id number] Date of Birth: 1934/01/29  Today's Date: 02/06/2022 OT Individual Time: 5784-6962 OT Individual Time Calculation (min): 75 min    Short Term Goals: Week 1:  OT Short Term Goal 1 (Week 1): STG=LTG 2/2 ELOS  Skilled Therapeutic Interventions/Progress Updates:    Pt resting in bed upon arrival and agreeable to getting OOB. Pt wanted to wash hair and take a shower but IV was running. Pt understanding that she wouldn't be able to shower with IV running. Pt requested to use toilet and amb with RW to bathroom with CGA. Toileting tasks with CGA. Pt donned socks and shoes with supervision while stead in w/c. Pt transitoined to ortho gym and engaged in BUE threx on Sci[employer]-8 mins level 1 with rest breaks x 1. PT engaged in standing activities with focus on activity tolerance and standing balance. Pt fatigues easily and requires multiple rest breaks. Min verbal cues for safety awareness. Pt returned to room and remained in w/c with all needs within reach. Belt alarm activated.   Therapy Documentation Precautions:  Precautions Precautions: Fall Precaution Comments: watch vitals Restrictions Weight Bearing Restrictions: No  Pain: Pain Assessment Pain Scale: 0-10 Pain Score: 0-No pain   Therapy/Group: Individual Therapy  Leroy Libman 02/06/2022, 10:32 AM

## 2022-02-06 NOTE — Patient Care Conference (Signed)
Inpatient RehabilitationTeam Conference and Plan of Care Update Date: 02/06/2022   Time: 11:05 AM    Patient Name: Shelby Walker      Medical Record Number: [id number]  Date of Birth: [birth year] Sex: Female         Room/Bed: 4W02C/4W02C-01 Payor Info: Payor: MEDICARE / Plan: MEDICARE PART A AND B / Product Type: *No Product type* /    Admit Date/Time:  02/02/2022  6:27 PM  Primary Diagnosis:  Severe sepsis with acute organ dysfunction due to Gram positive bacteria [facility])  Hospital Problems: Principal Problem:   Severe sepsis with acute organ dysfunction due to Gram positive bacteria ([place]) Active Problems:   Discitis of lumbar region   Herpes zoster without complication   Clostridium difficile colitis    Expected Discharge Date: Expected Discharge Date: 02/15/22  Team Members Present: Physician leading conference: Dr. Jennye Boroughs Social Worker Present: Loralee Pacas, [place] Nurse Present: Tacy Learn, RN PT Present: Ailene Rud, PT OT Present: Roanna Epley, Erling Cruz, OT PPS Coordinator present : Gunnar Fusi, SLP     Current Status/Progress Goal Weekly Team Focus  Bowel/Bladder     Incontinent/continent of B/B. To be continent x 2  Time toileting every 3 hours and prn    Swallow/Nutrition/ Hydration               ADL's   bahting-CGA; dressing-min A; toileting-CGA; functional transfers-CGA   supervision overall   activity tolerance, education, BADLs, safety awareness    Mobility   CGA mobility with RW, limited by pain and fatigue, endurance deficit   supervision, gait 200 ft  endurance, gait, strength    Communication                Safety/Cognition/ Behavioral Observations               Pain    C/O vaginal pain from blisters  Less than 3 out of 10 on pain scale with prn medication use  Assess every 4 hours and prn    Skin    MASD and blisters to buttocks. Incision to left upper chest without drainage.     Skin to remain  without any additional infection/breakdown  Assess every shift and prn      Discharge Planning:  Pt lives alone. Plans for pt to return to her home with support from her family. Fam edu scheduled for Wed (12/6) 9:30am-12pm with pt dtr [name] and her husband. SW will confirm there are no barriers to discharge.   Team Discussion: Severe sepsis with acute organ dysfunction due to gram positive bacteria. Incontinent/continent of B/B with constipation. Vaginal pain/discomfort from rash/blisters. Improvement with MASD with incision to left upper chest is intact without drainage. Fluids added. Dry. B/P meds held due to soft b/p. IV ABTS end 1/07. Therapy reports mild confusion at times with limited endurance.   Patient on target to meet rehab goals: yes, patient is on track for meeting goals  *See Care Plan and progress notes for long and short-term goals.   Revisions to Treatment Plan:  Medication adjustments, monitor labs, Home IV ABTS  Teaching Needs: Medications, safety, gait/transfer training, IV infusion, skin/wound care, etc  Current Barriers to Discharge: Decreased caregiver support, Home enviroment access/layout, IV antibiotics, Incontinence, and Wound care  Possible Resolutions to Barriers: Family education, nursing education, order recommended DME     Medical Summary Current Status: debility, DM2, RA, hypertension, C diff infection history, constipation  Barriers to Discharge: Hypotension;Inadequate Nutritional Intake;Infection/IV Antibiotics;Self-care education  Barriers to Discharge Comments: debility, DM2, RA, hypertension, C diff infection history, constipation, azotemia Possible Resolutions to [employer] Focus: IVF,IV abx, monitor BMP, BP meds held   Continued Need for Acute Rehabilitation Level of Care: The patient requires daily medical management by a physician with specialized training in physical medicine and rehabilitation for the following reasons: Direction of a  multidisciplinary physical rehabilitation program to maximize functional independence : Yes Medical management of patient stability for increased activity during participation in an intensive rehabilitation regime.: Yes Analysis of laboratory values and/or radiology reports with any subsequent need for medication adjustment and/or medical intervention. : Yes   I attest that I was present, lead the team conference, and concur with the assessment and plan of the team.   Ernest Pine 02/06/2022, 2:30 PM

## 2022-02-06 NOTE — Progress Notes (Signed)
PROGRESS NOTE   Subjective/Complaints: Just finished a therapy session this AM. Reports she is tired from her session. She is a little thirsty and plans to drink some fluids. Had mild lightheaded feeling during therapy.   ROS: Patient denies fever, rash, sore throat, blurred vision, dizziness, nausea, vomiting, diarrhea, cough, shortness of breath or chest pain, abdominal pain, joint or back/neck pain, headache, or mood change.  + constipation   Objective:   No results found. Recent Labs    02/05/22 0415  WBC 7.6  HGB 8.3*  HCT 25.3*  PLT 226    Recent Labs    02/05/22 0415 02/06/22 0300  NA 136 141  K 4.1 3.9  CL 100 108  CO2 27 26  GLUCOSE 166* 115*  BUN 32* 26*  CREATININE 1.80* 1.68*  CALCIUM 7.7* 7.8*     Intake/Output Summary (Last 24 hours) at 02/06/2022 0819 Last data filed at 02/06/2022 0751 Gross per 24 hour  Intake 315 ml  Output --  Net 315 ml         Physical Exam: Vital Signs Blood pressure (!) 106/51, pulse 81, temperature 98.3 F (36.8 C), resp. rate 16, weight 55.8 kg, SpO2 99 %.  Constitutional: No distress .  Sitting in chair. Vital signs reviewed. HEENT: NCAT, conjugate gaze, oral membranes moist Neck: supple Cardiovascular: RRR without murmur. No JVD    Respiratory/Chest: CTA Bilaterally without wheezes or rales. Normal effort    GI/Abdomen: BS +, non-tender, non-distended Ext: no clubbing, cyanosis, or edema Psych: pleasant and cooperative Skin: warm and dry, Clean and intact without signs of breakdown Neuro:  Alert and oriented x 3. Normal insight and awareness. Intact Memory. Normal language and speech. CN 2-12 intact, follows commands. Motor 4/5 UE, 3->4/5 LE prox to distal. No focal sensory findings.  Musculoskeletal: TMA well shaped. Normal ROM, fair sitting posture    Assessment/Plan: 1. Functional deficits which require 3+ hours per day of interdisciplinary therapy in  a comprehensive inpatient rehab setting. Physiatrist is providing close team supervision and 24 hour management of active medical problems listed below. Physiatrist and rehab team continue to assess barriers to discharge/monitor patient progress toward functional and medical goals  Care Tool:  Bathing    Body parts bathed by patient: Right arm, Left arm, Chest, Abdomen, Front perineal area, Right upper leg, Buttocks, Left upper leg, Right lower leg, Left lower leg, Face         Bathing assist Assist Level: Contact Guard/Touching assist     Upper Body Dressing/Undressing Upper body dressing   What is the patient wearing?: Bra, Pull over shirt    Upper body assist Assist Level: Minimal Assistance - Patient > 75%    Lower Body Dressing/Undressing Lower body dressing      What is the patient wearing?: Underwear/pull up, Pants     Lower body assist Assist for lower body dressing: Contact Guard/Touching assist     Toileting Toileting    Toileting assist Assist for toileting: Contact Guard/Touching assist     Transfers Chair/bed transfer  Transfers assist     Chair/bed transfer assist level: Minimal Assistance - Patient > 75%     Locomotion Ambulation  Ambulation assist      Assist level: Contact Guard/Touching assist Assistive device: Walker-rolling Max distance: 125   Walk 10 feet activity   Assist     Assist level: Contact Guard/Touching assist Assistive device: Walker-rolling   Walk 50 feet activity   Assist    Assist level: Contact Guard/Touching assist Assistive device: Walker-rolling    Walk 150 feet activity   Assist Walk 150 feet activity did not occur: Safety/medical concerns (dizziness)         Walk 10 feet on uneven surface  activity   Assist Walk 10 feet on uneven surfaces activity did not occur: Safety/medical concerns (dizziness)         Wheelchair     Assist Is the patient using a wheelchair?: Yes Type of  Wheelchair: Manual    Wheelchair assist level: Minimal Assistance - Patient > 75% Max wheelchair distance: 30    Wheelchair 50 feet with 2 turns activity    Assist    Wheelchair 50 feet with 2 turns activity did not occur:  (bil UE fatigue; pt propelled 30')   Assist Level: Moderate Assistance - Patient 50 - 74%   Wheelchair 150 feet activity     Assist  Wheelchair 150 feet activity did not occur:  (bil UE fatigue; pt propelled 30')   Assist Level: Total Assistance - Patient < 25%   Blood pressure (!) 106/51, pulse 81, temperature 98.3 F (36.8 C), resp. rate 16, weight 55.8 kg, SpO2 99 %.  Medical Problem List and Plan: 1. Functional deficits secondary to debility secondary to sepsis.              -patient may shower             -ELOS/Goals: 8-12 days S            -Continue CIR therapies including PT, OT   -Team conference today please see physician documentation under team conference tab, met with team  to discuss problems,progress, and goals. Formulized individual treatment plan based on medical history, underlying problem and comorbidities.   2.  Antithrombotics: -DVT/anticoagulation:  Pharmaceutical: Lovenox>> start 30 mg daily             -antiplatelet therapy: None 3. Pain Management: Scheduled Tylenol 62m for back pain.  Continue Lidoderm patches.  Aquathermia pad. 4. Mood/Behavior/Sleep: LCSW to evaluate and provide emotional support             -antipsychotic agents: n/a 5. Neuropsych/cognition: This patient is capable of making decisions on her own behalf. 6. Skin/Wound Care: Routine skin care checks 7. Fluids/Electrolytes/Nutrition: Strict I's and O's and follow-up chemistries             -Ensure with meals             -change to carb modified diet   12/3 intake remains poor-20-25% at best yesterday -continue trial of megace to boost appetite -recent bmet wnl except for mild CR elevation which has been baseline -12/4 Cr up to 1.8, Bun 32, start low  dose IVF 539mhr NS -12/6 Cr a little improved to 1.68, continue IVF for now for AKI 8: Paroxysmal atrial fibrillation: s/p ILR on 06/2021; no AC    9: Sinus node dysfunction s/p [employer] pacemaker 01/05/2022; Dr. G Beckie Salts           -subsequent generator removal 11/27             -continue Pacerone 200 mg daily  10: Diastolic dysfunction: Daily weight, monitor electrolytes             -continue Lasix 20 mg daily -daily weight ordered   Filed Weights   02/06/22 0700  Weight: 55.8 kg      11: Diabetes mellitus: CBGs q AC and q HS; carb modified diet             -continue SSI (may be able to dc when on CM diet)             -at home on Tresiba 6-8 units at bedtime, Novolog 3 units with meals   CBG (last 3)  Recent Labs    02/05/22 2035 02/06/22 0602 02/06/22 1145  GLUCAP 212* 100* 254*    12/3  fair control so far but she's still not eating much  12/5 will start 2u novolog with meals 12: Gastroesophageal reflux disease: continue Protonix   13: Hyperlipidemia: follow-up LFTs/restart Lipitor 20 mg daily at discharge   14: Rheumatoid arthritis: continue prednisone 5 mg daily   -12/4 Voltaren gel for L knee pain started  15: Paroxysmal atrial fibrillation: s/p ILR on 06/2021; no AC              -Zio patch placed today   16: Chronic kidney disease: Cr consistently around 1.2-1.3   17: Hypertension: monitor TID and prn             -continue Norvasc 10 mg daily             -continue Lasix 20 mg daily  -12/4 BP low today, continue to hold norvasc and lasix  12/5 continue to hold norvasc and lasix, BP still a little soft    18: Nausea: will schedule Zofran prior to antibiotic administration   19: Periostitis of lumbar spine without osteomyelitis/MSSA bacteremia:             -Cefazolin 2 grams twice daily through 12/6  -Per ID Dr. Tommy Medal 6 weeks of IV ancef given intracardiac device infection   20: History of C. difficile colitis: No evidence of recurrent  infection             -continue oral vancomycin 125 mg BID while on cefazolin and 1 week thereafter             -continue Florastor 21: Zoster, left posterior hip: continue valacylovir 1000 mg daily   -now just on contact precautions 22. Anemia:  Hgb fluctuating between 8 and 10. Likely d/t chronic disease/CKD  -check Iron panel tomorrow 23. Constipation  -12/4 Small BM today, feels constipated, order sorbitol  12/5 LBM today, improved, continue to monitor    LOS: 4 days A FACE TO FACE EVALUATION WAS PERFORMED  Shelby Walker 02/06/2022, 8:19 AM

## 2022-02-07 ENCOUNTER — Telehealth: Payer: Self-pay | Admitting: Internal Medicine

## 2022-02-07 DIAGNOSIS — I959 Hypotension, unspecified: Secondary | ICD-10-CM

## 2022-02-07 LAB — GLUCOSE, CAPILLARY
Glucose-Capillary: 115 mg/dL — ABNORMAL HIGH (ref 70–99)
Glucose-Capillary: 261 mg/dL — ABNORMAL HIGH (ref 70–99)
Glucose-Capillary: 158 mg/dL — ABNORMAL HIGH (ref 70–99)
Glucose-Capillary: 174 mg/dL — ABNORMAL HIGH (ref 70–99)

## 2022-02-07 NOTE — Progress Notes (Signed)
Occupational Therapy Session Note  Patient Details  Name: KAREEMAH GROUNDS MRN: [id number] Date of Birth: Jun 29, 1933  Today's Date: 02/07/2022 OT Individual Time: 1300-1315 OT Individual Time Calculation (min): 15 min  and Today's Date: 02/07/2022 OT Missed Time: 30 Minutes Missed Time Reason: Patient fatigue   Short Term Goals: Week 1:  OT Short Term Goal 1 (Week 1): STG=LTG 2/2 ELOS  Skilled Therapeutic Interventions/Progress Updates:    Pt resting in w/c upon arrival eating lunch with family present. Pt fatigued from morning therapies. Discussed modified schedule with family (15/7) facilitate pt's ability to benefit from therapy each day. Pt fatigued from previous day of therapy. Family and pt in agreement. Discussed with PTA Rosita who will discuss with PT Olivia on 12/7. Note left for OTR Jenn (CS) to follow up and modify schedule going forward if everyone in agreement. Pt missed 30 mins skilled OT services 2/2 fatigue.   Therapy Documentation Precautions:  Precautions Precautions: Fall Precaution Comments: watch vitals Restrictions Weight Bearing Restrictions: No General: General OT Amount of Missed Time: 30 Minutes Vital Signs:  Pain:  Pt denies pain this afternoon    Therapy/Group: Individual Therapy  Leroy Libman 02/07/2022, 1:32 PM

## 2022-02-07 NOTE — Progress Notes (Signed)
Physical Therapy Session Note  Patient Details  Name: Shelby Walker MRN: [id number] Date of Birth: 31-Aug-1933  Today's Date: 02/07/2022 PT Individual Time: 1104-1200 and 1403 - 1456 PT Individual Time Calculation (min): 56 min and 53 min  Short Term Goals: Week 1:  PT Short Term Goal 1 (Week 1): = LTGs due to ELOS  Skilled Therapeutic Interventions/Progress Updates: Pt presented in bed with family present agreeable to therapy. Pt c/o pain in low back and knee. Unable to receive pain meds until later in session with rest breaks and repositioning provided as needed during session. Discussed pt's current mobility status with family providing education anticipate to d/c at supervision level. Pt performed bed mobility with supervision and use of bed features. Pt required brief rest then able to perform ambulatory transfer to w/c ~45f with RW and CGA. Pt transported to ortho gym for time management. Pt demonstrated car transfer with RW and supervision with pt noting some decreased eccentric control upon descend. Discussed home entry with family indicating x1 step and x1 extended threshold. Pt agreeable to attempt x 1 step with RW then try side stepping over larger object to simulate door entry. Pt ambulated ~1298fto ortho gym then after brief rest performed x 1 step up with L rail with CGA. Pt also participated side stepping over balance beam with HHA requiring over beam with overall CGA. Pt able to perform however with increased unsteadiness. Discussed with family will continue to work on stepping over threholds forward and sideways for improved safety at home. Pt then propelled w/c to nsg station ~12059fsing x 4 extremities for general conditioning. Pt transported remaining distance back to room and agreeable to remain in w/c. Pt left in w/c at end of session with family present and needs met.   Tx2: Pt presented in w/c agreeable to therapy. Pt states pain has improved since previous session however  continues to be fatigued. Pt transported to dayroom for energy conservation. Performed ambulatory transfer to NuStep with CGA and RW. Participated in NuStep activities for BLE strengthening and general conditioning. Pt participated in x 5 min at L2 using x 4 extremities then 2 bouts of 3 min at L1 BLE only. Pt noted to rate exertion 8/10 on modified BORG scale consistently throughout session. Pt returned to w/c and then participated in bouncing basketball (small) against wall for standing tolerance and endurance. Pt able to complete x 8 before requiring seated rest. Pt then progressing to completing x 10 on second bout. On several attempts pt was able to grab ball from near floor and x 2 pick up ball from floor with CGA. Pt then transported back to room and prior to returning to bed requested to use bathroom. Performed ambulatory transfer to bathroom with RW and CGA and was able to perform clothing management and toilet transfer with supervision. Pt with small incontinent smear of bowel and continent urinary void. Pt was able to complete peri-care with supervision and increased time and change pad with supervision. Pt then ambulated to sink and performed hand hygiene in standing with CGA due to fatigue. Pt then ambulated several steps to bed and performed sit to supine with supervision. Pt repositioned to comfort and left with bed alarm on, call bell within reach and needs met.      Therapy Documentation Precautions:  Precautions Precautions: Fall Precaution Comments: watch vitals Restrictions Weight Bearing Restrictions: No General:   Vital Signs: Therapy Vitals Temp: 98.3 F (36.8 C) Temp Source: Oral Pulse Rate:  84 Resp: 16 BP: (!) 114/56 Patient Position (if appropriate): Sitting Oxygen Therapy SpO2: 100 % O2 Device: Room Air Pain:   Mobility:   Locomotion :    Trunk/Postural Assessment :    Balance:   Exercises:   Other Treatments:      Therapy/Group: Individual  Therapy  Addilyn Satterwhite 02/07/2022, 2:29 PM

## 2022-02-07 NOTE — Telephone Encounter (Signed)
New message   Pt daughter wants to know if pts wound check can be done in hospital. She is in rehab and not scheduled to d/c until 12/14

## 2022-02-07 NOTE — Progress Notes (Addendum)
PROGRESS NOTE   Subjective/Complaints: She says she feels sore from her therapy yesterday. Tylenol is helping. No new concerns or complaints.   ROS: Patient denies fever, rash, sore throat, blurred vision, dizziness, nausea, vomiting, diarrhea, cough, shortness of breath or chest pain, abdominal pain, joint or back/neck pain, headache, or mood change.  + constipation, + L knee soreness   Objective:   No results found. Recent Labs    02/05/22 0415  WBC 7.6  HGB 8.3*  HCT 25.3*  PLT 226    Recent Labs    02/05/22 0415 02/06/22 0300  NA 136 141  K 4.1 3.9  CL 100 108  CO2 27 26  GLUCOSE 166* 115*  BUN 32* 26*  CREATININE 1.80* 1.68*  CALCIUM 7.7* 7.8*     Intake/Output Summary (Last 24 hours) at 02/07/2022 0827 Last data filed at 02/06/2022 2253 Gross per 24 hour  Intake 1729.27 ml  Output --  Net 1729.27 ml         Physical Exam: Vital Signs Blood pressure (!) 121/46, pulse 77, temperature 98.2 F (36.8 C), resp. rate 16, weight 56 kg, SpO2 100 %.  Constitutional: No distress .  Sitting in chair. Vital signs reviewed. HEENT: NCAT, conjugate gaze, oral membranes moist Neck: supple Cardiovascular: RRR without murmur. No JVD    Respiratory/Chest: CTA Bilaterally without wheezes or rales. Non-labored GI/Abdomen: BS +, non-tender, non-distended Ext: no clubbing, cyanosis, or edema Psych: pleasant and cooperative Skin: warm and dry, Clean and intact without signs of breakdown Neuro:  Alert and oriented x 3. Normal insight and awareness. Intact Memory. Normal language and speech. CN 2-12 intact, follows commands. Motor 4/5 UE, 3->4/5 LE prox to distal. No focal sensory findings.  Musculoskeletal: TMA well shaped. Normal ROM, fair sitting posture . Mild L knee joint line tenderness   Assessment/Plan: 1. Functional deficits which require 3+ hours per day of interdisciplinary therapy in a comprehensive  inpatient rehab setting. Physiatrist is providing close team supervision and 24 hour management of active medical problems listed below. Physiatrist and rehab team continue to assess barriers to discharge/monitor patient progress toward functional and medical goals  Care Tool:  Bathing    Body parts bathed by patient: Right arm, Left arm, Chest, Abdomen, Front perineal area, Right upper leg, Buttocks, Left upper leg, Right lower leg, Left lower leg, Face         Bathing assist Assist Level: Contact Guard/Touching assist     Upper Body Dressing/Undressing Upper body dressing   What is the patient wearing?: Bra, Pull over shirt    Upper body assist Assist Level: Minimal Assistance - Patient > 75%    Lower Body Dressing/Undressing Lower body dressing      What is the patient wearing?: Underwear/pull up, Pants     Lower body assist Assist for lower body dressing: Contact Guard/Touching assist     Toileting Toileting    Toileting assist Assist for toileting: Contact Guard/Touching assist     Transfers Chair/bed transfer  Transfers assist     Chair/bed transfer assist level: Minimal Assistance - Patient > 75%     Locomotion Ambulation   Ambulation assist  Assist level: Contact Guard/Touching assist Assistive device: Walker-rolling Max distance: 125   Walk 10 feet activity   Assist     Assist level: Contact Guard/Touching assist Assistive device: Walker-rolling   Walk 50 feet activity   Assist    Assist level: Contact Guard/Touching assist Assistive device: Walker-rolling    Walk 150 feet activity   Assist Walk 150 feet activity did not occur: Safety/medical concerns (dizziness)         Walk 10 feet on uneven surface  activity   Assist Walk 10 feet on uneven surfaces activity did not occur: Safety/medical concerns (dizziness)         Wheelchair     Assist Is the patient using a wheelchair?: Yes Type of Wheelchair:  Manual    Wheelchair assist level: Minimal Assistance - Patient > 75% Max wheelchair distance: 30    Wheelchair 50 feet with 2 turns activity    Assist    Wheelchair 50 feet with 2 turns activity did not occur:  (bil UE fatigue; pt propelled 30')   Assist Level: Moderate Assistance - Patient 50 - 74%   Wheelchair 150 feet activity     Assist  Wheelchair 150 feet activity did not occur:  (bil UE fatigue; pt propelled 30')   Assist Level: Total Assistance - Patient < 25%   Blood pressure (!) 121/46, pulse 77, temperature 98.2 F (36.8 C), resp. rate 16, weight 56 kg, SpO2 100 %.  Medical Problem List and Plan: 1. Functional deficits secondary to debility secondary to sepsis.              -patient may shower             -ELOS/Goals: 8-12 days S            -Continue CIR therapies including PT, OT     2.  Antithrombotics: -DVT/anticoagulation:  Pharmaceutical: Lovenox>> start 30 mg daily             -antiplatelet therapy: None 3. Pain Management: Scheduled Tylenol '650mg'$  for back pain.  Continue Lidoderm patches.  Aquathermia pad. 4. Mood/Behavior/Sleep: LCSW to evaluate and provide emotional support             -antipsychotic agents: n/a 5. Neuropsych/cognition: This patient is capable of making decisions on her own behalf. 6. Skin/Wound Care: Routine skin care checks 7. Fluids/Electrolytes/Nutrition: Strict I's and O's and follow-up chemistries             -Ensure with meals             -change to carb modified diet   12/3 intake remains poor-20-25% at best yesterday -continue trial of megace to boost appetite -recent bmet wnl except for mild CR elevation which has been baseline -12/4 Cr up to 1.8, Bun 32, start low dose IVF 1m/hr NS -12/5 Cr a little improved to 1.68, continue IVF for now for AKI -recheck BMP tomorrow 8: Paroxysmal atrial fibrillation: s/p ILR on 06/2021; no AC    9: Sinus node dysfunction s/p Medtronic pacemaker 01/05/2022; Dr. GBeckie Salts             -subsequent generator removal 11/27             -continue Pacerone 200 mg daily              10: Diastolic dysfunction: Daily weight, monitor electrolytes             -continue Lasix 20 mg daily -daily weight ordered  Filed Weights   02/06/22 0700 02/07/22 0458  Weight: 55.8 kg 56 kg      11: Diabetes mellitus: CBGs q AC and q HS; carb modified diet             -continue SSI (may be able to dc when on CM diet)             -at home on Tresiba 6-8 units at bedtime, Novolog 3 units with meals   CBG (last 3)  Recent Labs    02/06/22 1652 02/06/22 2125 02/07/22 0610  GLUCAP 279* 140* 115*     12/3  fair control so far but she's still not eating much  12/5 will start 2u novolog with meals  12/6 improved CBG today, monitor response 12: Gastroesophageal reflux disease: continue Protonix   13: Hyperlipidemia: follow-up LFTs/restart Lipitor 20 mg daily at discharge   14: Rheumatoid arthritis: continue prednisone 5 mg daily   -12/4 Voltaren gel for L knee pain started  15: Paroxysmal atrial fibrillation: s/p ILR on 06/2021; no AC              -Zio patch placed today   16: Chronic kidney disease: Cr consistently around 1.2-1.3   17: Hypertension: monitor TID and prn             -continue Norvasc 10 mg daily             -continue Lasix 20 mg daily  -12/4 BP low today, continue to hold norvasc and lasix  12/5 continue to hold norvasc and lasix, BP still a little soft  12/6 BP improved today, hypotension is improving    18: Nausea: will schedule Zofran prior to antibiotic administration   19: Periostitis of lumbar spine without osteomyelitis/MSSA bacteremia:             -Cefazolin 2 grams twice daily through 12/6  -Per ID Dr. Tommy Medal 6 weeks of IV ancef given intracardiac device infection, recheck imaging prior to stopping IV abx in case she has psoas abscess   20: History of C. difficile colitis: No evidence of recurrent infection             -continue oral vancomycin 125  mg BID while on cefazolin and 1 week thereafter             -continue Florastor  -12/6 ID supportive continued prophylactic vancomycin  21: Zoster, left posterior hip: continue valacylovir 1000 mg daily   -now just on contact precautions  22. Anemia:  Hgb fluctuating between 8 and 10. Likely d/t chronic disease/CKD  -check Iron panel tomorrow  -Recheck tomorrow  23. Constipation  -12/4 Small BM today, feels constipated, order sorbitol  12/5 LBM today, improved, continue to monitor  24. Poor appetite   -Continue megace , family reports appetite is improving  LOS: 5 days A FACE TO FACE EVALUATION WAS PERFORMED  Jennye Boroughs 02/07/2022, 8:27 AM

## 2022-02-07 NOTE — Progress Notes (Signed)
Patient seen in room to check pacemaker generator explantation incision. She is up in [facility] eating lunch. Family at bedside. Incision is covered with several layers on intact steri-strips. There is no erythema, edema, drainage or tenderness to palpation. Will plan to monitor and remove steri-strips prior to discharge.

## 2022-02-07 NOTE — Telephone Encounter (Signed)
Spoke to patients daughter [name]. [name] reports patients is projected to be in hospital rehab until 02/15/22 but her wound check is 02/12/22. Advised I did not want to cancel wound check apt. Yet but to call first thing 02/12/22 morning and if patient is still in the hospital we can send a message to someone covering in the hospital to see if they can do her wound check. [name] voiced understanding and agreeable to plan.

## 2022-02-07 NOTE — Progress Notes (Signed)
Occupational Therapy Session Note  Patient Details  Name: Shelby Walker MRN: [id number] Date of Birth: [birth year]  Today's Date: 02/07/2022 OT Individual Time: 0867-6195 OT Individual Time Calculation (min): 56 min    Short Term Goals: Week 1:  OT Short Term Goal 1 (Week 1): STG=LTG 2/2 ELOS  Skilled Therapeutic Interventions/Progress Updates:    PT seated EOB upon arrival with son and daughter present for education. Pt reports that she is fatigued from previous days therapy and didn't rest well during the night. Reviewed recommendation for 24 hour supervision. PT requested to use bathroom and amb with RW to bathroom with CGA/supervision. Pt's daughter assisted with toilet hygiene. Reviewed home safety and recommendations. Discussed shower transfers. Pt's home bathroom is so small that a RW cannot be used in bathroom. Will simulate in future therapies to determine the most appropriate and safe method for pt to access bathroom and shower. Pt donned socks/shoes with supervision. Pt requested to lay back onto bed on Kpad for pain relief. Pt remained rested in bed with family present.   Therapy Documentation Precautions:  Precautions Precautions: Fall Precaution Comments: watch vitals Restrictions Weight Bearing Restrictions: No  Pain: Pt c/o low back pain with unsupported sitting; heat (kpad) applied and repositinoed   Therapy/Group: Individual Therapy  Leroy Libman 02/07/2022, 10:34 AM

## 2022-02-08 DIAGNOSIS — Z794 Long term (current) use of insulin: Secondary | ICD-10-CM

## 2022-02-08 DIAGNOSIS — N189 Chronic kidney disease, unspecified: Secondary | ICD-10-CM

## 2022-02-08 DIAGNOSIS — D631 Anemia in chronic kidney disease: Secondary | ICD-10-CM

## 2022-02-08 DIAGNOSIS — E1122 Type 2 diabetes mellitus with diabetic chronic kidney disease: Principal | ICD-10-CM

## 2022-02-08 DIAGNOSIS — I5032 Chronic diastolic (congestive) heart failure: Secondary | ICD-10-CM

## 2022-02-08 LAB — GLUCOSE, CAPILLARY
Glucose-Capillary: 140 mg/dL — ABNORMAL HIGH (ref 70–99)
Glucose-Capillary: 252 mg/dL — ABNORMAL HIGH (ref 70–99)
Glucose-Capillary: 179 mg/dL — ABNORMAL HIGH (ref 70–99)
Glucose-Capillary: 256 mg/dL — ABNORMAL HIGH (ref 70–99)

## 2022-02-08 LAB — BASIC METABOLIC PANEL
Anion gap: 8 (ref 5–15)
BUN: 19 mg/dL (ref 8–23)
CO2: 21 mmol/L — ABNORMAL LOW (ref 22–32)
Calcium: 7.5 mg/dL — ABNORMAL LOW (ref 8.9–10.3)
Chloride: 109 mmol/L (ref 98–111)
Creatinine, Ser: 1.48 mg/dL — ABNORMAL HIGH (ref 0.44–1.00)
GFR, Estimated: 34 mL/min — ABNORMAL LOW (ref >60)
Glucose, Bld: 166 mg/dL — ABNORMAL HIGH (ref 70–99)
Potassium: 3.8 mmol/L (ref 3.5–5.1)
Sodium: 138 mmol/L (ref 135–145)

## 2022-02-08 LAB — CBC
HCT: 23.8 % — ABNORMAL LOW (ref 36.0–46.0)
Hemoglobin: 7.6 g/dL — ABNORMAL LOW (ref 12.0–15.0)
MCH: 30 pg (ref 26.0–34.0)
MCHC: 31.9 g/dL (ref 30.0–36.0)
MCV: 94.1 fL (ref 80.0–100.0)
Platelets: 226 10*3/uL (ref 150–400)
RBC: 2.53 MIL/uL — ABNORMAL LOW (ref 3.87–5.11)
RDW: 16 % — ABNORMAL HIGH (ref 11.5–15.5)
WBC: 5 10*3/uL (ref 4.0–10.5)
nRBC: 0 % (ref 0.0–0.2)

## 2022-02-08 NOTE — Progress Notes (Signed)
PROGRESS NOTE   Subjective/Complaints: Reports her back is a little sore from therapy. No additional concerns or complaints.   Review of Systems  Constitutional:  Negative for chills and fever.  Respiratory:  Negative for shortness of breath.   Cardiovascular:  Negative for chest pain.  Gastrointestinal: Negative.   Genitourinary: Negative.   Musculoskeletal:  Positive for back pain.  Skin:  Negative for rash.  Neurological:  Positive for weakness.      Objective:   No results found. Recent Labs    02/08/22 0425  WBC 5.0  HGB 7.6*  HCT 23.8*  PLT 226    Recent Labs    02/06/22 0300 02/08/22 0425  NA 141 138  K 3.9 3.8  CL 108 109  CO2 26 21*  GLUCOSE 115* 166*  BUN 26* 19  CREATININE 1.68* 1.48*  CALCIUM 7.8* 7.5*     Intake/Output Summary (Last 24 hours) at 02/08/2022 1102 Last data filed at 02/08/2022 0827 Gross per 24 hour  Intake 600 ml  Output --  Net 600 ml         Physical Exam: Vital Signs Blood pressure 127/62, pulse 77, temperature 98.1 F (36.7 C), resp. rate 15, weight 57.2 kg, SpO2 99 %.  Constitutional: No distress .  Sitting in chair. Vital signs reviewed. HEENT: NCAT, conjugate gaze, oral membranes moist Neck: supple Cardiovascular: RRR without murmur. No JVD    Respiratory/Chest: CTA Bilaterally without wheezes or rales. Non-labored, pacemaker incision with no signs of infection GI/Abdomen: BS +, non-tender, non-distended Ext: no clubbing, cyanosis, or edema Psych: pleasant and cooperative Skin: warm and dry, Clean and intact without signs of breakdown Neuro:  Alert and oriented x 3. Normal insight and awareness. Intact Memory. Normal language and speech. CN 2-12 intact, follows commands. Motor 4/5 UE, 3->4/5 LE prox to distal. No focal sensory findings.  Musculoskeletal: TMA well shaped. Normal ROM, fair sitting posture . Mild L knee joint line  tenderness   Assessment/Plan: 1. Functional deficits which require 3+ hours per day of interdisciplinary therapy in a comprehensive inpatient rehab setting. Physiatrist is providing close team supervision and 24 hour management of active medical problems listed below. Physiatrist and rehab team continue to assess barriers to discharge/monitor patient progress toward functional and medical goals  Care Tool:  Bathing    Body parts bathed by patient: Right arm, Left arm, Chest, Abdomen, Front perineal area, Right upper leg, Buttocks, Left upper leg, Right lower leg, Left lower leg, Face         Bathing assist Assist Level: Contact Guard/Touching assist     Upper Body Dressing/Undressing Upper body dressing   What is the patient wearing?: Bra, Pull over shirt    Upper body assist Assist Level: Minimal Assistance - Patient > 75%    Lower Body Dressing/Undressing Lower body dressing      What is the patient wearing?: Underwear/pull up, Pants     Lower body assist Assist for lower body dressing: Contact Guard/Touching assist     Toileting Toileting    Toileting assist Assist for toileting: Contact Guard/Touching assist     Transfers Chair/bed transfer  Transfers assist     Chair/bed  transfer assist level: Minimal Assistance - Patient > 75%     Locomotion Ambulation   Ambulation assist      Assist level: Contact Guard/Touching assist Assistive device: Walker-rolling Max distance: 125   Walk 10 feet activity   Assist     Assist level: Contact Guard/Touching assist Assistive device: Walker-rolling   Walk 50 feet activity   Assist    Assist level: Contact Guard/Touching assist Assistive device: Walker-rolling    Walk 150 feet activity   Assist Walk 150 feet activity did not occur: Safety/medical concerns (dizziness)         Walk 10 feet on uneven surface  activity   Assist Walk 10 feet on uneven surfaces activity did not occur:  Safety/medical concerns (dizziness)         Wheelchair     Assist Is the patient using a wheelchair?: Yes Type of Wheelchair: Manual    Wheelchair assist level: Minimal Assistance - Patient > 75% Max wheelchair distance: 30    Wheelchair 50 feet with 2 turns activity    Assist    Wheelchair 50 feet with 2 turns activity did not occur:  (bil UE fatigue; pt propelled 30')   Assist Level: Moderate Assistance - Patient 50 - 74%   Wheelchair 150 feet activity     Assist  Wheelchair 150 feet activity did not occur:  (bil UE fatigue; pt propelled 30')   Assist Level: Total Assistance - Patient < 25%   Blood pressure 127/62, pulse 77, temperature 98.1 F (36.7 C), resp. rate 15, weight 57.2 kg, SpO2 99 %.  Medical Problem List and Plan: 1. Functional deficits secondary to debility secondary to sepsis.              -patient may shower             -ELOS/Goals: 12/14, sup            -Continue CIR therapies including PT, OT     2.  Antithrombotics: -DVT/anticoagulation:  Pharmaceutical: Lovenox>> start 30 mg daily             -antiplatelet therapy: None 3. Pain Management: Scheduled Tylenol '650mg'$  for back pain.  Continue Lidoderm patches.  Aquathermia pad. 4. Mood/Behavior/Sleep: LCSW to evaluate and provide emotional support             -antipsychotic agents: n/a 5. Neuropsych/cognition: This patient is capable of making decisions on her own behalf. 6. Skin/Wound Care: Routine skin care checks 7. Fluids/Electrolytes/Nutrition: Strict I's and O's and follow-up chemistries             -Ensure with meals             -change to carb modified diet   12/3 intake remains poor-20-25% at best yesterday -continue trial of megace to boost appetite -recent bmet wnl except for mild CR elevation which has been baseline -12/4 Cr up to 1.8, Bun 32, start low dose IVF 55m/hr NS -12/5 Cr a little improved to 1.68, continue IVF for now for AKI -12/7 will dc IVF today, Cr down to  1.48  8: Paroxysmal atrial fibrillation: s/p ILR on 06/2021; no AC    9: Sinus node dysfunction s/p Medtronic pacemaker 01/05/2022; Dr. GBeckie Salts            -subsequent generator removal 11/27             -continue Pacerone 200 mg daily  10: Diastolic dysfunction: Daily weight, monitor electrolytes             -continue Lasix 20 mg daily -daily weight trending up, will stop IVF   [facility] Weights   02/06/22 0700 02/07/22 0458 02/08/22 0608  Weight: 55.8 kg 56 kg 57.2 kg      11: Diabetes mellitus: CBGs q AC and q HS; carb modified diet             -continue SSI (may be able to dc when on CM diet)             -at home on Tresiba 6-8 units at bedtime, Novolog 3 units with meals   CBG (last 3)  Recent Labs    02/07/22 1701 02/07/22 2108 02/08/22 0602  GLUCAP 158* 174* 140*     12/3  fair control so far but she's still not eating much  12/5 will start 2u novolog with meals  12/6 improved CBG today, monitor response  -stable continue to monitor 12: Gastroesophageal reflux disease: continue Protonix   13: Hyperlipidemia: follow-up LFTs/restart Lipitor 20 mg daily at discharge   14: Rheumatoid arthritis: continue prednisone 5 mg daily   -12/4 Voltaren gel for L knee pain started  15: Paroxysmal atrial fibrillation: s/p ILR on 06/2021; no AC              -Zio patch placed today   16: Chronic kidney disease: Cr consistently around 1.2-1.3   17: Hypertension: monitor TID and prn             -continue Norvasc 10 mg daily             -continue Lasix 20 mg daily  -12/4 BP low today, continue to hold norvasc and lasix  12/5 continue to hold norvasc and lasix, BP still a little soft  12/6 BP improved today, hypotension is improving    18: Nausea: will schedule Zofran prior to antibiotic administration   19: Periostitis of lumbar spine without osteomyelitis/MSSA bacteremia:             -Cefazolin 2 grams twice daily through 12/6  -Per ID Dr. Tommy Medal 6 weeks of IV ancef  given intracardiac device infection, recheck imaging prior to stopping IV abx in case she has psoas abscess   20: History of C. difficile colitis: No evidence of recurrent infection             -continue oral vancomycin 125 mg BID while on cefazolin and 1 week thereafter             -continue Florastor  -12/6 ID supportive continued prophylactic vancomycin  21: Zoster, left posterior hip: continue valacylovir 1000 mg daily   -now just on contact precautions  22. Anemia:  Hgb fluctuating between 8 and 10. Likely d/t chronic disease/CKD  -check Iron panel tomorrow  -12/7 HGB down to 7.6 today, recheck occult blood stool test  23. Constipation  -12/4 Small BM today, feels constipated, order sorbitol  12/5 LBM today, improved, continue to monitor  24. Poor appetite   -Continue megace , family reports appetite is improving  LOS: 6 days A FACE TO FACE EVALUATION WAS PERFORMED  Jennye Boroughs 02/08/2022, 11:02 AM

## 2022-02-08 NOTE — Progress Notes (Signed)
Occupational Therapy Session Note  Patient Details  Name: Shelby Walker MRN: [id number] Date of Birth: 11/08/1933  Today's Date: 02/08/2022 OT Individual Time: 0800-0900 OT Individual Time Calculation (min): 60 min    Short Term Goals: Week 1:  OT Short Term Goal 1 (Week 1): STG=LTG 2/2 ELOS  Skilled Therapeutic Interventions/Progress Updates:    Pt received in bed with intermittent shoulder and knee pain, unrated.  Rest and repositiong  provided for pain relief  ADL: Pt completes ADL at overall CGA for toileting, dressing at EOB LB, MIN A for bra/shirt, and set up for seated grooming at sink Level. Skilled interventions include: cuing for sequnce of donning shirt. Pt would benefit from trial of clipping bra and putting on like a sports bra and donning shirt to thread head with reacher over head. Pt able to walk into and out of the bathroom with CGA using RW and cleanse seated with set up.   Therapeutic exercise Pt completes 3x1 min beach ball volley in supported sitting position with 1 # dowel rod for dynamic balance, postural control, BUE strengthening and endurance required for BADLs and functional transfers.   Pt left at end of session in bed with exit alarm on, call light in reach and all needs met   Therapy Documentation Precautions:  Precautions Precautions: Fall Precaution Comments: watch vitals Restrictions Weight Bearing Restrictions: No General:    Therapy/Group: Individual Therapy   M  02/08/2022, 6:58 AM 

## 2022-02-08 NOTE — Progress Notes (Signed)
Physical Therapy Session Note  Patient Details  Name: Shelby Walker MRN: [id number] Date of Birth: [birth year]  Today's Date: 02/08/2022 PT Individual Time: 0930-1040, 1330-1430 PT Individual Time Calculation (min): 70 min, 60 min   Short Term Goals: Week 1:  PT Short Term Goal 1 (Week 1): = LTGs due to ELOS  Skilled Therapeutic Interventions/Progress Updates:    Session 1: pt received in bed and agreeable to therapy. Pt reports chronic back and L knee pain, nsg made aware as pt unsure if she took medication or not. Supine>sit with supervision and bed features. Sit to stand and ambulatory transfer with RW and CGA to w/c. Pt transported to therapy gym for time management and energy conservation. Therapist managed IV pole throughout session. Pt navigated ramp and mulch with cues for safety, keeping RW level, RW proximity. Pt also navigated 4 steps with lateral technique, leading with L foot to approximate pt's home environment. Pt then ambulated 3 x 170 ft with CGA and RW. Cues for using UE to offload painful LLE. Pt's gait limited by fatigue and LLE pain. Extended seated rest breaks required. Pt then utilized nustep x 3 at level 2, for global strength and endurance. Pt reports feeling "funny" and "swimmy headed" BP found to be 113/59(75). Nsg made aware. Pt returned to room and was able to return to bed in the same manner as above. Pt was left with all needs in reach and alarm active.   Session 2: Pt recd sitting EOB, her daughter present. Pt reports increased pain in her L knee today but agreeable to attempt. premedicated. Rest and positioning provided as needed. Pt requests to use bathroom. ambulatory transfer to bathroom with CGA and RW, clothing management with supervision. Pt found to have incontinent BM. Doffed soiled pad and panties and donned clean tot a for time and safety. Pt performed pericare with supervision, assist for thoroughness. Pt then stood at sink to wash hands, but became slightly  anxious and flustered. Pt reports increased fatigue, and daughter states pt had similar reactions at home PTA. Pt's vital signs stable. Pt then ambulated 2 x 120 ft with CGA and RW. Extended seated rest break spent discussing home set up and plan for supervision at home. Therapist answered questions as able. Pt returned to room and remained seated in w/c, was left with all needs in reach and alarm active. Pt missed 15 minutes of therapy d/t pain and fatigue.   Therapy Documentation Precautions:  Precautions Precautions: Fall Precaution Comments: watch vitals Restrictions Weight Bearing Restrictions: No General: Pt missed 15 min d/t pain and fatigue.   Therapy/Group: Individual Therapy  Masiyah Engen C Gildo Crisco 02/08/2022, 10:00 AM

## 2022-02-08 NOTE — Progress Notes (Signed)
Patient ID: Shelby Walker, female   DOB: Feb 06, 1934, 88 y.o.   MRN: [id number]  SW waiting on follow-up from family about preferred [facility] as they would like to work with [facility] however, need to decide if they are willing to pay the costs of supplies for IV abx that are not covered under Medicare, and costs not covered with [facility] versus [facility] ([place] [facility]) or [place]. SW waiting on follow-up. Pam Chandler/[facility] explained this to pt dtr and waiting on follow-up.   Loralee Pacas, MSW, [place] Office: [phone] Cell: [phone] Fax: [phone]

## 2022-02-09 ENCOUNTER — Ambulatory Visit: Payer: Medicare Other | Admitting: Podiatry

## 2022-02-09 LAB — CBC WITH DIFFERENTIAL/PLATELET
Abs Immature Granulocytes: 0.01 10*3/uL (ref 0.00–0.07)
Basophils Absolute: 0.1 10*3/uL (ref 0.0–0.1)
Basophils Relative: 1 %
Eosinophils Absolute: 0.1 10*3/uL (ref 0.0–0.5)
Eosinophils Relative: 1 %
HCT: 26.8 % — ABNORMAL LOW (ref 36.0–46.0)
Hemoglobin: 8.5 g/dL — ABNORMAL LOW (ref 12.0–15.0)
Immature Granulocytes: 0 %
Lymphocytes Relative: 37 %
Lymphs Abs: 1.9 10*3/uL (ref 0.7–4.0)
MCH: 29.9 pg (ref 26.0–34.0)
MCHC: 31.7 g/dL (ref 30.0–36.0)
MCV: 94.4 fL (ref 80.0–100.0)
Monocytes Absolute: 0.7 10*3/uL (ref 0.1–1.0)
Monocytes Relative: 13 %
Neutro Abs: 2.4 10*3/uL (ref 1.7–7.7)
Neutrophils Relative %: 48 %
Platelets: 243 10*3/uL (ref 150–400)
RBC: 2.84 MIL/uL — ABNORMAL LOW (ref 3.87–5.11)
RDW: 16.5 % — ABNORMAL HIGH (ref 11.5–15.5)
WBC: 5.1 10*3/uL (ref 4.0–10.5)
nRBC: 0 % (ref 0.0–0.2)

## 2022-02-09 LAB — COMPREHENSIVE METABOLIC PANEL
ALT: 5 U/L (ref 0–44)
AST: 23 U/L (ref 15–41)
Albumin: 2.6 g/dL — ABNORMAL LOW (ref 3.5–5.0)
Alkaline Phosphatase: 37 U/L — ABNORMAL LOW (ref 38–126)
Anion gap: 8 (ref 5–15)
BUN: 18 mg/dL (ref 8–23)
CO2: 21 mmol/L — ABNORMAL LOW (ref 22–32)
Calcium: 8.1 mg/dL — ABNORMAL LOW (ref 8.9–10.3)
Chloride: 109 mmol/L (ref 98–111)
Creatinine, Ser: 1.47 mg/dL — ABNORMAL HIGH (ref 0.44–1.00)
GFR, Estimated: 35 mL/min — ABNORMAL LOW (ref >60)
Glucose, Bld: 323 mg/dL — ABNORMAL HIGH (ref 70–99)
Potassium: 4.2 mmol/L (ref 3.5–5.1)
Sodium: 138 mmol/L (ref 135–145)
Total Bilirubin: 0.3 mg/dL (ref 0.3–1.2)
Total Protein: 5.4 g/dL — ABNORMAL LOW (ref 6.5–8.1)

## 2022-02-09 LAB — C-REACTIVE PROTEIN: CRP: 0.5 mg/dL (ref <1.0)

## 2022-02-09 LAB — GLUCOSE, CAPILLARY
Glucose-Capillary: 153 mg/dL — ABNORMAL HIGH (ref 70–99)
Glucose-Capillary: 276 mg/dL — ABNORMAL HIGH (ref 70–99)
Glucose-Capillary: 187 mg/dL — ABNORMAL HIGH (ref 70–99)
Glucose-Capillary: 140 mg/dL — ABNORMAL HIGH (ref 70–99)

## 2022-02-09 LAB — SEDIMENTATION RATE: Sed Rate: 18 mm/hr (ref 0–22)

## 2022-02-09 MED ORDER — INSULIN ASPART 100 UNIT/ML IJ SOLN
3.0000 [IU] | Freq: Three times a day (TID) | INTRAMUSCULAR | Status: DC
  Administered 2022-02-09 – 2022-02-13 (×13): 3 [IU] via SUBCUTANEOUS

## 2022-02-09 NOTE — Progress Notes (Signed)
Occupational Therapy Session Note  Patient Details  Name: Shelby Walker MRN: [id number] Date of Birth: 1933-04-24  Today's Date: 02/09/2022 OT Individual Time: 1100-1200 OT Individual Time Calculation (min): 60 min    Short Term Goals: Week 1:  OT Short Term Goal 1 (Week 1): STG=LTG 2/2 ELOS  Skilled Therapeutic Interventions/Progress Updates:    Pain reported during session as 4/10. Rest and repositiong  provided for pain relief. Patient actively participated in the [employer] program in a group setting for social participation. Session focused on education and training in breathing techniques to regulate the nervous system, gentle yoga poses with a focus on trunk and leg stretches, guided meditation to regulate attention and guided discussion on the topic of body scan/feeling whole. Skilled treatment interventions include. Patient required demonstration cues and CGA assist for seated postures/trunk control. Exited session with pt seated in w/c, exit alarm on and call light in reach   Therapy Documentation Precautions:  Precautions Precautions: Fall Precaution Comments: watch vitals Restrictions Weight Bearing Restrictions: No General:     Therapy/Group: Individual Therapy  Tonny Branch 02/09/2022, 6:58 AM

## 2022-02-09 NOTE — Plan of Care (Signed)
  Problem: Consults Goal: RH GENERAL PATIENT EDUCATION Description: See Patient Education module for education specifics. Outcome: Progressing Goal: Skin Care Protocol Initiated - if Braden Score 18 or less Description: If consults are not indicated, leave blank or document N/A Outcome: Progressing   Problem: RH BOWEL ELIMINATION Goal: RH STG MANAGE BOWEL WITH ASSISTANCE Description: STG Manage Bowel with min Assistance. Outcome: Progressing Goal: RH STG MANAGE BOWEL W/MEDICATION W/ASSISTANCE Description: STG Manage Bowel with Medication with min Assistance. Outcome: Progressing   Problem: RH BLADDER ELIMINATION Goal: RH STG MANAGE BLADDER WITH ASSISTANCE Description: STG Manage Bladder With min Assistance Outcome: Progressing   Problem: RH SKIN INTEGRITY Goal: RH STG SKIN FREE OF INFECTION/BREAKDOWN Description: Skin will not have any additional infection/breakdown with min assist Outcome: Progressing Goal: RH STG ABLE TO PERFORM INCISION/WOUND CARE W/ASSISTANCE Description: STG Able To Perform Incision/Wound Care With min Assistance. Outcome: Progressing   Problem: RH SAFETY Goal: RH STG ADHERE TO SAFETY PRECAUTIONS W/ASSISTANCE/DEVICE Description: STG Adhere to Safety Precautions With min Assistance/Device. Outcome: Progressing   Problem: RH PAIN MANAGEMENT Goal: RH STG PAIN MANAGED AT OR BELOW PT'S PAIN GOAL Description: Pain will be managed at 4 out of 10 on pain scale with prn medications min assist Outcome: Progressing   Problem: RH KNOWLEDGE DEFICIT GENERAL Goal: RH STG INCREASE KNOWLEDGE OF SELF CARE AFTER HOSPITALIZATION Description: Patient will be able to manage medications, skin/wound care, and self care from nursing education and handouts independently  Outcome: Progressing   Problem: Education: Goal: Knowledge of cardiac device and self-care will improve Outcome: Progressing Goal: Ability to safely manage health related needs after discharge will  improve Outcome: Progressing Goal: Individualized Educational Video(s) Outcome: Progressing   Problem: Cardiac: Goal: Ability to achieve and maintain adequate cardiopulmonary perfusion will improve Outcome: Progressing

## 2022-02-09 NOTE — Progress Notes (Signed)
Pt with complaint of fatigue and "do not feel good." Dr. Marciano Sequin aware. New orders in place and in progress.   Gerald Stabs, RN

## 2022-02-09 NOTE — Progress Notes (Signed)
Physical Therapy Session Note  Patient Details  Name: KYNLEI PIONTEK MRN: [id number] Date of Birth: 09-19-1933  Today's Date: 02/09/2022 PT Individual Time: 1020-1045 PT Individual Time Calculation (min): 25 min   Short Term Goals: Week 1:  PT Short Term Goal 1 (Week 1): = LTGs due to ELOS  Skilled Therapeutic Interventions/Progress Updates: Pt presented in w/c agreeable to therapy. Pt states mild unrated pain in knee and low back. Pt noted increased fatigue and shakiness this am. PTA noted HgB 7.5 this am, discussed with nsg due to downward trend with nsg indicating currently rechecking labs and will request EKG to verify pacemaker is working. Pt transported pt to day room for energy conservation. Pt participated in Cybex Kinetron x 5 min at 50cm/sec for general conditioning. Pt was able to maintain consistent pacing without cues. Pt concerned due to next session ([facility]) and energy levels, provided education regarding can be performed at chair level and can speak with therapist regarding how fatigued activity may be. At that time technician present to EKG. Pt transported back to room and remained in w/c with son Marlou Sa present and current needs met.      Therapy Documentation Precautions:  Precautions Precautions: Fall Precaution Comments: watch vitals Restrictions Weight Bearing Restrictions: No General:   Vital Signs:   Pain: Pain Assessment Pain Scale: Faces Faces Pain Scale: Hurts a little bit Pain Type: Chronic pain Pain Location: Leg Pain Orientation: Left Pain Descriptors / Indicators: Aching Pain Onset: On-going Pain Intervention(s): Medication (See eMAR) Mobility:   Locomotion :    Trunk/Postural Assessment :    Balance:   Exercises:   Other Treatments:      Therapy/Group: Individual Therapy  Shamar Engelmann 02/09/2022, 10:51 AM

## 2022-02-09 NOTE — Progress Notes (Signed)
PROGRESS NOTE   Subjective/Complaints: Reports she feels more fatigued and achy today.  Mild GI upset today, she feels she might have bm soon.   Review of Systems  Constitutional:  Positive for malaise/fatigue. Negative for chills and fever.  Eyes:  Negative for double vision.  Respiratory:  Negative for shortness of breath.   Cardiovascular:  Negative for chest pain.  Gastrointestinal:  Positive for nausea. Negative for abdominal pain, diarrhea, heartburn and vomiting.  Genitourinary: Negative.   Musculoskeletal:  Positive for back pain.  Skin:  Negative for rash.  Neurological:  Positive for weakness.      Objective:   No results found. Recent Labs    02/08/22 0425  WBC 5.0  HGB 7.6*  HCT 23.8*  PLT 226    Recent Labs    02/08/22 0425  NA 138  K 3.8  CL 109  CO2 21*  GLUCOSE 166*  BUN 19  CREATININE 1.48*  CALCIUM 7.5*     Intake/Output Summary (Last 24 hours) at 02/09/2022 0809 Last data filed at 02/08/2022 1805 Gross per 24 hour  Intake 360 ml  Output --  Net 360 ml         Physical Exam: Vital Signs Blood pressure 127/71, pulse 75, temperature 98.7 F (37.1 C), temperature source Oral, resp. rate 16, weight 56.9 kg, SpO2 99 %.  Constitutional: No distress .  Sitting in bed. Vital signs reviewed. HEENT: NCAT, conjugate gaze, oral membranes moist Neck: supple Cardiovascular: RRR without murmur. No JVD    Respiratory/Chest: CTA Bilaterally without wheezes or rales. Non-labored, pacemaker incision with no signs of infection GI/Abdomen: BS +, non-tender, non-distended Ext: no clubbing, cyanosis, or edema Psych: pleasant and cooperative Skin: warm and dry, Clean and intact without signs of breakdown Neuro:  Alert and oriented x 3. Follows commands. Normal insight and awareness. Intact Memory. Normal language and speech. CN 2-12 intact, follows commands. Motor 4/5 UE, 3->4/5 LE prox to distal.  No focal sensory findings.  Musculoskeletal: TMA well shaped. Normal ROM, fair sitting posture . Mild L knee joint line tenderness   Assessment/Plan: 1. Functional deficits which require 3+ hours per day of interdisciplinary therapy in a comprehensive inpatient rehab setting. Physiatrist is providing close team supervision and 24 hour management of active medical problems listed below. Physiatrist and rehab team continue to assess barriers to discharge/monitor patient progress toward functional and medical goals  Care Tool:  Bathing    Body parts bathed by patient: Right arm, Left arm, Chest, Abdomen, Front perineal area, Right upper leg, Buttocks, Left upper leg, Right lower leg, Left lower leg, Face         Bathing assist Assist Level: Contact Guard/Touching assist     Upper Body Dressing/Undressing Upper body dressing   What is the patient wearing?: Bra, Pull over shirt    Upper body assist Assist Level: Minimal Assistance - Patient > 75%    Lower Body Dressing/Undressing Lower body dressing      What is the patient wearing?: Underwear/pull up, Pants     Lower body assist Assist for lower body dressing: Contact Guard/Touching assist     Toileting Toileting    Toileting assist  Assist for toileting: Contact Guard/Touching assist     Transfers Chair/bed transfer  Transfers assist     Chair/bed transfer assist level: Minimal Assistance - Patient > 75%     Locomotion Ambulation   Ambulation assist      Assist level: Contact Guard/Touching assist Assistive device: Walker-rolling Max distance: 125   Walk 10 feet activity   Assist     Assist level: Contact Guard/Touching assist Assistive device: Walker-rolling   Walk 50 feet activity   Assist    Assist level: Contact Guard/Touching assist Assistive device: Walker-rolling    Walk 150 feet activity   Assist Walk 150 feet activity did not occur: Safety/medical concerns (dizziness)          Walk 10 feet on uneven surface  activity   Assist Walk 10 feet on uneven surfaces activity did not occur: Safety/medical concerns (dizziness)         Wheelchair     Assist Is the patient using a wheelchair?: Yes Type of Wheelchair: Manual    Wheelchair assist level: Minimal Assistance - Patient > 75% Max wheelchair distance: 30    Wheelchair 50 feet with 2 turns activity    Assist    Wheelchair 50 feet with 2 turns activity did not occur:  (bil UE fatigue; pt propelled 30')   Assist Level: Moderate Assistance - Patient 50 - 74%   Wheelchair 150 feet activity     Assist  Wheelchair 150 feet activity did not occur:  (bil UE fatigue; pt propelled 30')   Assist Level: Total Assistance - Patient < 25%   Blood pressure 127/71, pulse 75, temperature 98.7 F (37.1 C), temperature source Oral, resp. rate 16, weight 56.9 kg, SpO2 99 %.  Medical Problem List and Plan: 1. Functional deficits secondary to debility secondary to sepsis.              -patient may shower             -ELOS/Goals: 12/14, sup            -Continue CIR therapies including PT, OT     2.  Antithrombotics: -DVT/anticoagulation:  Pharmaceutical: Lovenox>> start 30 mg daily             -antiplatelet therapy: None 3. Pain Management: Scheduled Tylenol '650mg'$  for back pain.  Continue Lidoderm patches.  Aquathermia pad. 4. Mood/Behavior/Sleep: LCSW to evaluate and provide emotional support             -antipsychotic agents: n/a 5. Neuropsych/cognition: This patient is capable of making decisions on her own behalf. 6. Skin/Wound Care: Routine skin care checks 7. Fluids/Electrolytes/Nutrition: Strict I's and O's and follow-up chemistries             -Ensure with meals             -change to carb modified diet   12/3 intake remains poor-20-25% at best yesterday -continue trial of megace to boost appetite -recent bmet wnl except for mild CR elevation which has been baseline -12/4 Cr up to 1.8,  Bun 32, start low dose IVF 14m/hr NS -12/5 Cr a little improved to 1.68, continue IVF for now for AKI -12/7 will dc IVF today, Cr down to 1.48 -12/8 recheck labs today  8: Paroxysmal atrial fibrillation: s/p ILR on 06/2021; no AC    9: Sinus node dysfunction s/p Med[employer]onic pacemaker 01/05/2022; Dr. GBeckie Salts            -subsequent generator removal 11/27             -  continue Pacerone 200 mg daily              10: Diastolic dysfunction: Daily weight, monitor electrolytes             -continue Lasix 20 mg daily -daily weight trending up, will stop IVF -Weight stable today, continue to monitor   Filed Weights   02/07/22 0458 02/08/22 0608 02/09/22 0418  Weight: 56 kg 57.2 kg 56.9 kg      11: Diabetes mellitus: CBGs q AC and q HS; carb modified diet             -continue SSI (may be able to dc when on CM diet)             -at home on Tresiba 6-8 units at bedtime, Novolog 3 units with meals   CBG (last 3)  Recent Labs    02/08/22 1653 02/08/22 2053 02/09/22 0556  GLUCAP 179* 256* 153*     12/3  fair control so far but she's still not eating much  12/5 will start 2u novolog with meals  12/8 increase to 3u novolog with meals today 12: Gastroesophageal reflux disease: continue Protonix   13: Hyperlipidemia: follow-up LFTs/restart Lipitor 20 mg daily at discharge   14: Rheumatoid arthritis: continue prednisone 5 mg daily   -12/4 Voltaren gel for L knee pain started  15: Paroxysmal atrial fibrillation: s/p ILR on 06/2021; no AC              -Zio patch placed today   16: Chronic kidney disease: Cr consistently around 1.2-1.3   17: Hypertension: monitor TID and prn             -continue Norvasc 10 mg daily             -continue Lasix 20 mg daily  -12/4 BP low today, continue to hold norvasc and lasix  12/5 continue to hold norvasc and lasix, BP still a little soft  12/7 well controled today, continue to hold norvasc      18: Nausea: will schedule Zofran prior to antibiotic  administration   19: Periostitis of lumbar spine without osteomyelitis/MSSA bacteremia:             -Cefazolin 2 grams twice daily through 12/6  -Per ID Dr. Tommy Medal 6 weeks of IV ancef given intracardiac device infection, recheck imaging prior to stopping IV abx in case she has psoas abscess  -CBC ordered today   20: History of C. difficile colitis: No evidence of recurrent infection             -continue oral vancomycin 125 mg BID while on cefazolin and 1 week thereafter             -continue Florastor  -12/6 ID supportive continued prophylactic vancomycin  21: Zoster, left posterior hip: continue valacylovir 1000 mg daily   -now just on contact precautions  22. Anemia:  Hgb fluctuating between 8 and 10. Likely d/t chronic disease/CKD  -check Iron panel tomorrow  -12/7 HGB down to 7.6 today, recheck occult blood stool test  -recheck CBC ordered  23. Constipation  -12/4 Small BM today, feels constipated, order sorbitol  12/5 LBM today, improved, continue to monitor  24. Poor appetite   -Continue megace , family reports appetite is improving  LOS: 7 days A FACE TO FACE EVALUATION WAS PERFORMED  Jennye Boroughs 02/09/2022, 8:09 AM

## 2022-02-09 NOTE — Progress Notes (Signed)
Occupational Therapy Session Note  Patient Details  Name: Shelby Walker MRN: [id number] Date of Birth: Nov 20, 1933  Today's Date: 02/09/2022 OT Individual Time: 0845-1000 OT Individual Time Calculation (min): 75 min    Short Term Goals: Week 1:  OT Short Term Goal 1 (Week 1): STG=LTG 2/2 ELOS  Skilled Therapeutic Interventions/Progress Updates:    Upon OT arrival, pt semi recumbent in bed stating "I don't feel very good. Is there any way you can come back in the afternoon?". Therapist provides encouragement and reports therapist unable to return for OT this afternoon. Pt agreeable to attempt participation. Pt completes supine to sit transfer with Supervision. Treatment intervention with a focus on activity tolerance, self care retraining, endurance. Pt completes sit to stand transfer with SBA and ambulates to closet to retrieve her clothes with SBA. Pt stands at sink to complete oral care with SBA but does require one seated rest break secondary to fatigue. Pt doff/donns LB clothing with SBA and changes her shirt with Min A for line management secondary to IV. Pt noted to have incontinence of BM and ambulates to bathroom to completes toilet transfer and toileting with SBA. Increased time required and underwear change with SBA, pants and shoes doffed/donned with Max A for time management. Pt ambulates to sink to complete hand hygiene with SBA and takes seated rest break with SBA. Pt performs stand step transfer to sink with SBA and stands with Supervision to comb hair. Pt returns to seated position for rest break. Pt completes stand pivot transfer into w/c with SBA and was transported to dayroom gym via w/c and total A for time management. Therapist donned B wrist weights of .75lb to and pt performs repetitive reaching using B UE to remove and replace cards onto vertical surface. Pt with min difficulty to complete but is able to remove and replace 17 cards. Pt was returned to her room via w/c and total A  and left in w/c at end of session with all needs met and safety measures in place.   Therapy Documentation Precautions:  Precautions Precautions: Fall Precaution Comments: watch vitals Restrictions Weight Bearing Restrictions: No   Therapy/Group: Individual Therapy  Shirlean Berman 02/09/2022, 9:22 AM

## 2022-02-09 NOTE — Progress Notes (Signed)
Patient ID: Shelby Walker, female   DOB: 1933-04-17, 88 y.o.   MRN: [id number]  SW spoke with pt dtr [name] to follow-up about decision on [facility]. Reports they would like to work with [facility], and are willing to pay expense for supplies not covered by Medicare. SW shared liaison Pam will follow-up again about education moving forward, and [facility] will follow-up about scheduling home visit.   SW spoke with Pam/[facility]) to inform on above. Will follow-up with family.   SW sent HHPT/OT/AN/aide order to [facility].   Loralee Pacas, MSW, [place] Office: [phone] Cell: [phone] Fax: [phone]

## 2022-02-09 NOTE — Plan of Care (Signed)
Pt's plan of care adjusted to 15/7 after speaking with care team and discussed with MD in team conference as pt currently unable to tolerate current therapy schedule with OT, PT, and SLP.   

## 2022-02-09 NOTE — Consult Note (Addendum)
Cardiology Consultation   Patient ID: Shelby Walker MRN: [id number]; DOB: [birth year]  Admit date: 02/02/2022 Date of Consult: 02/09/2022  PCP:  Reynold Bowen, [place] [facility] Cardiologist:  Mertie Moores, MD  Electrophysiologist:  Virl Axe, MD       Patient Profile:   Shelby Walker is a 88 y.o. female with a hx of CAD (Coronary CTA 10/23 noted moderate (50-69%) mixed plaque in the left main and LAD.Can not rule out severe (>70%) splenosis in OM1), diastolic dysfunction, U9NA, HLD, parox afib, CKD, RA (on chronic steroids)   who is being seen 02/09/2022 for the evaluation of fatigue, reduced exertional capacity (s/p PPM extraction) at the request of Dr. Curlene Dolphin.  History of Present Illness:   Shelby Walker recently has ILR for Afib  burden, was found to have pauses, one as long as 25 seconds reportedly asymptomatic, though also apparently quite ill with COVID, spending much of her time in bed sleeping and subsequently recovered from [place].   Admitted to the hospital found with no reversible causes and underwent PPM implant 01/05/22 by Dr. Lovena Le and discharged 01/06/22.   She had her wound check visit, wound reported to be healing well without signs of infection on 01/18/22.   Admitted 01/24/22 with a day of reported abd pain, N/V/D developed AMS, hypoxic, septic, felt 2/2 pyelonephritis, seen on imaging as well as dirty UA with pyuria and leukocytes.  Lactic acid elevated. Required vasopressors.  AKI/CKD, hypoxia felt to be spurious reading, VQ was negative, and weaning O2 quickly, found with some pleural effusions. She was febrile to 103   ID consulted  MSSA bacteremia, did NOT think she had GU infection. Neg c.Diff (with this historically).  Concerns of pacer involvement and requested EP input. Unclear abdominal connection. With neg KUB > NGT (now out) Planned to follow this clinically and consider retesting Cdiff if needed or treat emperically. Planned for TEE  though even if negative, concerns of pacer   Also noted w/ DIC w/u in progress Discussion by CCM abnormal Trops in d/w cardiology likely demand 2/2 AKI and shock/sepsis, unable to anticoagulate 2/2 DIC and recommended ongoing trop monitoring trend 01/24/22 BC 2of2 w/staph (MSSA by ID note) Urine w/ecoli and pseudomonas   Planned for PPM removal once labs/DIC allowed TEE 01/29/22 no endocarditits, normal LV/RV function  During her stay she developed AFib (known for her though had been quiescent) and started on amiodarone (Dr. Lovena Le) Underwent PPM system removal 01/29/22 with plans for amiodarone 200 mg BID x 5 days, then 200 mg daily  She was back in SR EP s/o 01/30/22 with no plans for new device implant  Discharged to CIR 02/02/22  Today the pt reported not feeling well, BPs have been OK and on vitals HRs 70's, planned for labs, EKG and EP asked to see her given pacer removed  EKG today is SR 78, normal intervals K+ 4.2 BUN/Creat 18/1.47 (looks he baseline) WBC 5.1 H/H 8.5/26.8 (also probably about her baseline) Plts 243  She is feeling a bit better currently, reports today just felt tired, a little shaky, her knee is aching today, perhaps a bit winded earlier No dizzy spells, no syncope or near syncope. No CP, palpitations   Past Medical History:  Diagnosis Date   Bruit    Carotid Doppler showed no significant abnormality     9per patient)   C. difficile colitis 07/2018   with severe sepsis   Chest pain, unspecified  Nuclear, May, 2008, no scar or ischemia   Diabetes mellitus    Diverticulosis    Dyslipidemia    Ejection fraction    EF 55-60%, echo, February, 2011 // Echocardiogram 8/21: EF 60-65, no RWMA, Gr 1 DD, GLS -14%, normal RVSF, mild LAE, trivial MR, mild MS (mean 4 mmHg), RVSP 23.4    GERD (gastroesophageal reflux disease)    Mitral regurgitation 04/21/2009   mild,  echo, February, 2011   Osteoporosis    Palpitations    possible very brief atrial  fibrillation on monitor and possible reentrant tachycardia   Psoriasis    Rheumatoid arthritis ([place])     Past Surgical History:  Procedure Laterality Date   ABDOMINAL AORTOGRAM W/LOWER EXTREMITY Right 10/19/2020   Procedure: ABDOMINAL AORTOGRAM W/LOWER EXTREMITY;  Surgeon: Wellington Hampshire, MD;  Location: [facility];  Service: Cardiovascular;  Laterality: Right;   FRACTURE SURGERY     LOOP RECORDER REMOVAL N/A 01/05/2022   Procedure: LOOP RECORDER REMOVAL;  Surgeon: Evans Lance, MD;  Location: [facility];  Service: Cardiovascular;  Laterality: N/A;   PACEMAKER IMPLANT N/A 01/05/2022   Procedure: PACEMAKER IMPLANT;  Surgeon: Evans Lance, MD;  Location: [facility];  Service: Cardiovascular;  Laterality: N/A;   PPM GENERATOR REMOVAL N/A 01/29/2022   Procedure: PPM GENERATOR REMOVAL;  Surgeon: Evans Lance, MD;  Location: [facility];  Service: Cardiovascular;  Laterality: N/A;   TEE WITHOUT CARDIOVERSION N/A 01/29/2022   Procedure: TRANSESOPHAGEAL ECHOCARDIOGRAM (TEE);  Surgeon: Geralynn Rile, MD;  Location: [facility];  Service: Cardiovascular;  Laterality: N/A;   TRANSMETATARSAL AMPUTATION Right 01/18/2021   Procedure: TRANSMETATARSAL AMPUTATION;  Surgeon: Wylene Simmer, MD;  Location: [facility];  Service: Orthopedics;  Laterality: Right;     Home Medications:  Prior to Admission medications   Medication Sig Start Date End Date Taking? Authorizing Provider  acetaminophen (TYLENOL) 650 MG CR tablet Take 1,300 mg by mouth every 8 (eight) hours as needed for pain.    [provider]  acetaminophen-codeine (TYLENOL #3) 300-30 MG tablet Take 1 tablet by mouth in the morning, at noon, and at bedtime. Patient taking differently: Take 1 tablet by mouth as needed for moderate pain. 02/06/21   Love, Ivan Anchors, PA-C  amiodarone (PACERONE) 200 MG tablet Take 1 tablet (200 mg total) by mouth 2 (two) times daily for 3 days, THEN 1 tablet (200 mg total)  daily. 02/02/22 04/05/22  Caren Griffins, MD  amLODipine (NORVASC) 10 MG tablet Take 1 tablet (10 mg total) by mouth daily. 02/05/22   Caren Griffins, MD  atorvastatin (LIPITOR) 20 MG tablet Take 1 tablet (20 mg total) by mouth daily. 08/25/21   Nahser, Wonda Cheng, MD  Calcium Carbonate-Vitamin D 600-400 MG-UNIT tablet Take 1 tablet by mouth daily at 12 noon.     [provider]  ceFAZolin (ANCEF) 2-4 GM/100ML-% IVPB Inject 100 mLs (2 g total) into the vein 2 (two) times daily. 02/02/22   Caren Griffins, MD  diclofenac Sodium (VOLTAREN) 1 % GEL Apply 2 g topically 4 (four) times daily. 02/06/21   Love, Ivan Anchors, PA-C  furosemide (LASIX) 20 MG tablet Take 1 tablet (20 mg total) by mouth daily. 02/02/22   Caren Griffins, MD  gabapentin (NEURONTIN) 100 MG capsule Take 1 capsule (100 mg total) by mouth 3 (three) times daily. 02/06/21   Love, Ivan Anchors, PA-C  insulin aspart (NOVOLOG) 100 UNIT/ML FlexPen Inject 3 Units  into the skin 3 (three) times daily with meals. Sliding scale Patient taking differently: Inject 6-8 Units into the skin 3 (three) times daily with meals. Sliding scale 02/06/21   Love, Ivan Anchors, PA-C  Insulin Degludec (TRESIBA [place]) Inject 6-8 Units into the skin at bedtime.    [provider]  lidocaine (LIDODERM) 5 % Place 1 patch onto the skin daily. Remove & Discard patch within 12 hours or as directed by MD 02/03/22   Caren Griffins, MD  lip balm (CARMEX) ointment Apply topically as needed for lip care. 02/01/21   Love, Ivan Anchors, PA-C  liver oil-zinc oxide (DESITIN) 40 % ointment Apply 1 application topically 3 (three) times daily. To raw areas in between/on buttocks 02/01/21   Love, Ivan Anchors, PA-C  Multiple Vitamins-Minerals (CENTRUM SILVER 50+WOMEN PO) Take 1 tablet by mouth daily.    [provider]  nitroGLYCERIN (NITROSTAT) 0.4 MG SL tablet Dissolve 1 tablet under the tongue every 5 minutes as needed for chest pain. Max of 3 doses, then 911. 12/20/21    Nahser, Wonda Cheng, MD  Nutritional Supplements (ENSURE ACTIVE) LIQD Take 237 mLs by mouth in the morning. Prefers Chocolate    [provider]  pantoprazole (PROTONIX) 40 MG tablet Take 1 tablet (40 mg total) by mouth daily. 02/02/22   Caren Griffins, MD  predniSONE (DELTASONE) 5 MG tablet Take 1 tablet (5 mg total) by mouth daily with breakfast. 02/02/22   Caren Griffins, MD  pyrithione zinc (SELSUN BLUE DRY SCALP) 1 % shampoo Apply 1 application topically once a week. 04/15/15   [provider]  RESTASIS 0.05 % ophthalmic emulsion Place 1 drop into both eyes 2 (two) times daily. 02/13/19   [provider]  saccharomyces boulardii (FLORASTOR) 250 MG capsule Take 250 mg by mouth 2 (two) times daily.    [provider]  traMADol (ULTRAM) 50 MG tablet Take 50 mg by mouth 2 (two) times daily as needed for moderate pain or severe pain. 07/18/21   [provider]  valACYclovir (VALTREX) 1000 MG tablet Take 1 tablet (1,000 mg total) by mouth daily. 02/02/22   Caren Griffins, MD  vancomycin (VANCOCIN) 125 MG capsule Take 1 capsule (125 mg total) by mouth 2 (two) times daily. 02/02/22   Caren Griffins, MD    Inpatient Medications: Scheduled Meds:  acetaminophen  325 mg Oral Q6H   amiodarone  200 mg Oral Daily   Chlorhexidine Gluconate Cloth  6 each Topical Q12H   diclofenac Sodium  4 g Topical QID   enoxaparin (LOVENOX) injection  30 mg Subcutaneous Q24H   feeding supplement  237 mL Oral BID WC   insulin aspart  0-9 Units Subcutaneous TID AC & HS   insulin aspart  3 Units Subcutaneous TID WC   lidocaine  1 patch Transdermal Q24H   magic mouthwash  10 mL Oral QID   megestrol  400 mg Oral BID   multivitamin  1 tablet Oral QHS   ondansetron (ZOFRAN) IV  4 mg Intravenous BID   pantoprazole  40 mg Oral Daily   predniSONE  5 mg Oral Q breakfast   saccharomyces boulardii  250 mg Oral BID   sodium chloride flush  10-40 mL Intracatheter Q12H   sodium  chloride flush  3 mL Intravenous Q12H   sorbitol  45 mL Oral Once   vancomycin  125 mg Oral BID   Continuous Infusions:   ceFAZolin (ANCEF) IV 2 g (02/09/22 0823)  PRN Meds: alum & mag hydroxide-simeth, diphenhydrAMINE, docusate sodium, guaiFENesin-dextromethorphan, methocarbamol, polyethylene glycol, sodium chloride flush, sodium phosphate, sorbitol, traZODone  Allergies:    Allergies  Allergen Reactions   Cholestyramine     Possible- Makes throat burn. Patient said she can't take it    Compazine [Prochlorperazine Edisylate] Swelling    REACTION: " tongue swell and unable to swallow"   Sulfonamide Derivatives Other (See Comments)    REACTION: " broke out with fine itching bumps"   Hydrocodone Other (See Comments)   Infliximab Other (See Comments)    Other reaction(s): rash   Leflunomide     Other reaction(s): diarrhea   Prochlorperazine Other (See Comments)    Other reaction(s): Unknown   Rosuvastatin     Other reaction(s): muscle aches   Sulfa Antibiotics Other (See Comments)    Other reaction(s): tongue swelling    Social History:   Social History   Socioeconomic History   Marital status: Widowed    Spouse name: [name]   Number of children: 2   Years of education: 13   Highest education level: Associate degree: academic program  Occupational History    Employer: RETIRED  Tobacco Use   Smoking status: Never   Smokeless tobacco: Never  Vaping Use   Vaping Use: Never used  Substance and Sexual Activity   Alcohol use: No    Alcohol/week: 0.0 standard drinks of alcohol   Drug use: No   Sexual activity: Yes    Birth control/protection: Post-menopausal  Other Topics Concern   Not on file  Social History Narrative   She is married to Willow and lives with him 2 children   Retired   No alcohol or drugs and never smoker   Social Determinants of Radio broadcast assistant Strain: Low Risk  (07/24/2018)   Overall Financial Resource Strain (CARDIA)     Difficulty of Paying Living Expenses: Not hard at all  Food Insecurity: No Food Insecurity (01/25/2022)   Hunger Vital Sign    Worried About Running Out of Food in the Last Year: Never true    [place] in the Last Year: Never true  Transportation Needs: No Transportation Needs (01/25/2022)   PRAPARE - Hydrologist (Medical): No    Lack of Transportation (Non-Medical): No  Physical Activity: Sufficiently Active (07/24/2018)   Exercise Vital Sign    Days of Exercise per Week: 5 days    Minutes of Exercise per Session: 30 min  Stress: No Stress Concern Present (07/24/2018)   [place]    Feeling of Stress : Not at all  Social Connections: [place] (07/24/2018)   Social Connection and Isolation Panel [NHANES]    Frequency of Communication with Friends and Family: More than three times a week    Frequency of Social Gatherings with Friends and Family: More than three times a week    Attends Religious Services: More than 4 times per year    Active Member of [employer] or Organizations: Yes    Attends Archivist Meetings: More than 4 times per year    Marital Status: Married  Human resources officer Violence: Not At Risk (01/25/2022)   Humiliation, Afraid, Rape, and Kick questionnaire    Fear of Current or Ex-Partner: No    Emotionally Abused: No    Physically Abused: No    Sexually Abused: No    Family History:   Family History  Problem  Relation Age of Onset   Heart failure Father    Heart attack Father    Diabetes Father    Heart attack Mother    Heart failure Mother    Diabetes Mother    Stroke Sister      ROS:  Please see the history of present illness.  All other ROS reviewed and negative.     Physical Exam/Data:   Vitals:   02/08/22 0608 02/08/22 1539 02/08/22 2058 02/09/22 0418  BP:  (!) 140/60 (!) 143/52 127/71  Pulse:  74 79 75  Resp:  '16 16 16  '$ Temp:   98.8 F (37.1 C) 98.5 F (36.9 C) 98.7 F (37.1 C)  TempSrc:  Oral  Oral  SpO2:  100% 100% 99%  Weight: 57.2 kg   56.9 kg    Intake/Output Summary (Last 24 hours) at 02/09/2022 1321 Last data filed at 02/09/2022 1016 Gross per 24 hour  Intake 250 ml  Output --  Net 250 ml      02/09/2022    4:18 AM 02/08/2022    6:08 AM 02/07/2022    4:58 AM  Last 3 Weights  Weight (lbs) 125 lb 7.1 oz 126 lb 1.7 oz 123 lb 7.3 oz  Weight (kg) 56.9 kg 57.2 kg 56 kg     Body mass index is 23.7 kg/m.  General:  Well nourished, well developed, in no acute distress, appears her age 78: normal Neck: no JVD Vascular: No carotid bruits; Distal pulses 2+ bilaterally Cardiac:  RRR; no murmurs, gallops or rubs Lungs: CTA b/l, no wheezing, rhonchi or rales  Abd: soft, nontender Ext: no edema Musculoskeletal:  No deformities, advanced atrophy Skin: warm and dry  Neuro:  no focal abnormalities noted Psych:  Normal affect   EKG:  The EKG was personally reviewed and demonstrates:   SR 78bpm  Telemetry:  Telemetry was personally reviewed and demonstrates:   N/a  Relevant CV Studies:  01/29/22: TEE No endocarditis detected.  Normal LV/RV function.  Full report to follow. Further management per primary team.    TTE 01/25/22  1. Left ventricular ejection fraction, by estimation, is 50 to 55%. The  left ventricle has low normal function. The left ventricle has no regional  wall motion abnormalities. Left ventricular diastolic parameters are  consistent with Grade II diastolic  dysfunction (pseudonormalization).   2. Right ventricular systolic function is normal. The right ventricular  size is normal. There is normal pulmonary artery systolic pressure. The  estimated right ventricular systolic pressure is 01.6 mmHg.   3. Left atrial size was moderately dilated.   4. The mitral valve is degenerative. Mild mitral valve regurgitation. The  mean mitral valve gradient is 4.0 mmHg with average  heart rate of 77 bpm.   5. The aortic valve is tricuspid. There is mild calcification of the  aortic valve. There is mild thickening of the aortic valve. Aortic valve  regurgitation is not visualized. Aortic valve sclerosis is present, with  no evidence of aortic valve stenosis.   6. The inferior vena cava is dilated in size with <50% respiratory  variability, suggesting right atrial pressure of 15 mmHg.   Laboratory Data:  High Sensitivity Troponin:   Recent Labs  Lab 01/24/22 0935 01/25/22 1021 01/25/22 1453 01/25/22 1655  TROPONINIHS 42* 1,059* 1,789* 1,991*     Chemistry Recent Labs  Lab 02/06/22 0300 02/08/22 0425 02/09/22 1042  NA 141 138 138  K 3.9 3.8 4.2  CL 108 109 109  CO2 26 21* 21*  GLUCOSE 115* 166* 323*  BUN 26* 19 18  CREATININE 1.68* 1.48* 1.47*  CALCIUM 7.8* 7.5* 8.1*  GFRNONAA 29* 34* 35*  ANIONGAP '7 8 8    '$ Recent Labs  Lab 02/09/22 1042  PROT 5.4*  ALBUMIN 2.6*  AST 23  ALT 5  ALKPHOS 37*  BILITOT 0.3   Lipids No results for input(s): "CHOL", "TRIG", "HDL", "LABVLDL", "LDLCALC", "CHOLHDL" in the last 168 hours.  Hematology Recent Labs  Lab 02/05/22 0415 02/08/22 0425 02/09/22 1042  WBC 7.6 5.0 5.1  RBC 2.80* 2.53* 2.84*  HGB 8.3* 7.6* 8.5*  HCT 25.3* 23.8* 26.8*  MCV 90.4 94.1 94.4  MCH 29.6 30.0 29.9  MCHC 32.8 31.9 31.7  RDW 15.2 16.0* 16.5*  PLT 226 226 243   Thyroid No results for input(s): "TSH", "FREET4" in the last 168 hours.  BNPNo results for input(s): "BNP", "PROBNP" in the last 168 hours.  DDimer No results for input(s): "DDIMER" in the last 168 hours.   Radiology/Studies:  No results found.   Assessment and Plan:   New fatigue/weakness Her vitals look OK EKG with normal intervals Her PPM indication was long pause, and would expect symptoms more like abrupt syncope  Her pacing burden was extremely low <0.1 % both chambers.  I do not suspect her fatigue today is HR driven She has a ZIo on with no alerts,  tracings reviewed are all SR with no bradycardia or heart block or AFib  Site looks great, steri strips removed without difficulty Wound edges are well approximated with no erythema, edema, or fluctuation No heat No signs of infection is well healed  MD will see later today   Risk Assessment/Risk Scores:    For questions or updates, please contact [place] Please consult [email or web address] for contact info under    Signed, Baldwin [place], PA-C  02/09/2022 1:21 PM

## 2022-02-09 NOTE — Progress Notes (Addendum)
Labs and EKG pending. Contacted EP team to evaluate patient later today.  EKG: NSR, rate 78  Sed rate = 18; CRP is pending  CBC    Component Value Date/Time   WBC 5.1 02/09/2022 1042   RBC 2.84 (L) 02/09/2022 1042   HGB 8.5 (L) 02/09/2022 1042   HGB 9.8 (L) 06/05/2021 0920   HCT 26.8 (L) 02/09/2022 1042   HCT 30.7 (L) 06/05/2021 0920   PLT 243 02/09/2022 1042   PLT 252 06/05/2021 0920   MCV 94.4 02/09/2022 1042   MCV 86 06/05/2021 0920   MCH 29.9 02/09/2022 1042   MCHC 31.7 02/09/2022 1042   RDW 16.5 (H) 02/09/2022 1042   RDW 15.3 06/05/2021 0920   LYMPHSABS 1.9 02/09/2022 1042   LYMPHSABS 1.5 06/05/2021 0920   MONOABS 0.7 02/09/2022 1042   EOSABS 0.1 02/09/2022 1042   EOSABS 0.1 06/05/2021 0920   BASOSABS 0.1 02/09/2022 1042   BASOSABS 0.0 06/05/2021 0920       Latest Ref Rng & Units 02/09/2022   10:42 AM 02/08/2022    4:25 AM 02/06/2022    3:00 AM  CMP  Glucose 70 - 99 mg/dL 323  166  115   BUN 8 - 23 mg/dL '18  19  26   '$ Creatinine 0.44 - 1.00 mg/dL 1.47  1.48  1.68   Sodium 135 - 145 mmol/L 138  138  141   Potassium 3.5 - 5.1 mmol/L 4.2  3.8  3.9   Chloride 98 - 111 mmol/L 109  109  108   CO2 22 - 32 mmol/L '21  21  26   '$ Calcium 8.9 - 10.3 mg/dL 8.1  7.5  7.8   Total Protein 6.5 - 8.1 g/dL 5.4     Total Bilirubin 0.3 - 1.2 mg/dL 0.3     Alkaline Phos 38 - 126 U/L 37     AST 15 - 41 U/L 23     ALT 0 - 44 U/L 5

## 2022-02-10 LAB — GLUCOSE, CAPILLARY
Glucose-Capillary: 137 mg/dL — ABNORMAL HIGH (ref 70–99)
Glucose-Capillary: 202 mg/dL — ABNORMAL HIGH (ref 70–99)
Glucose-Capillary: 266 mg/dL — ABNORMAL HIGH (ref 70–99)
Glucose-Capillary: 158 mg/dL — ABNORMAL HIGH (ref 70–99)

## 2022-02-10 MED ORDER — LINEZOLID 600 MG PO TABS: 600.0000 mg | ORAL_TABLET | Freq: Two times a day (BID) | ORAL | Status: DC

## 2022-02-10 MED ORDER — SODIUM CHLORIDE 0.9 % IV SOLN
INTRAVENOUS | Status: DC | PRN
  Administered 2022-02-10: 10 mL/h via INTRAVENOUS

## 2022-02-10 MED ORDER — CEFAZOLIN SODIUM-DEXTROSE 2-4 GM/100ML-% IV SOLN
2.0000 g | Freq: Two times a day (BID) | INTRAVENOUS | Status: DC
  Administered 2022-02-10 – 2022-02-15 (×10): 2 g via INTRAVENOUS
  Filled 2022-02-10 (×10): qty 100

## 2022-02-10 NOTE — Progress Notes (Signed)
PROGRESS NOTE   Subjective/Complaints: C/o loose incont stools , no abd pain or fevers , on IV abx  Review of Systems  Constitutional:  Positive for malaise/fatigue. Negative for chills and fever.  Eyes:  Negative for double vision.  Respiratory:  Negative for shortness of breath.   Cardiovascular:  Negative for chest pain.  Gastrointestinal:  Positive for nausea. Negative for abdominal pain, diarrhea, heartburn and vomiting.  Genitourinary: Negative.   Musculoskeletal:  Positive for back pain.  Skin:  Negative for rash.  Neurological:  Positive for weakness.      Objective:   No results found. Recent Labs    02/08/22 0425 02/09/22 1042  WBC 5.0 5.1  HGB 7.6* 8.5*  HCT 23.8* 26.8*  PLT 226 243    Recent Labs    02/08/22 0425 02/09/22 1042  NA 138 138  K 3.8 4.2  CL 109 109  CO2 21* 21*  GLUCOSE 166* 323*  BUN 19 18  CREATININE 1.48* 1.47*  CALCIUM 7.5* 8.1*     Intake/Output Summary (Last 24 hours) at 02/10/2022 1335 Last data filed at 02/10/2022 0915 Gross per 24 hour  Intake 680 ml  Output --  Net 680 ml         Physical Exam: Vital Signs Blood pressure (!) 146/58, pulse 82, temperature 98.1 F (36.7 C), temperature source Oral, resp. rate 18, weight 56.9 kg, SpO2 100 %.   General: No acute distress Mood and affect are appropriate Heart: Regular rate and rhythm no rubs murmurs or extra sounds Lungs: Clear to auscultation, breathing unlabored, no rales or wheezes Abdomen: Positive bowel sounds, soft nontender to palpation, nondistended Extremities: No clubbing, cyanosis, or edema   Neuro:  Alert and oriented x 3. Follows commands. Normal insight and awareness. Intact Memory. Normal language and speech. CN 2-12 intact, follows commands. Motor 4/5 UE, 3->4/5 LE prox to distal. No focal sensory findings.  Musculoskeletal: TMA well shaped. Normal ROM, fair sitting posture . Mild L knee joint  line tenderness   Assessment/Plan: 1. Functional deficits which require 3+ hours per day of interdisciplinary therapy in a comprehensive inpatient rehab setting. Physiatrist is providing close team supervision and 24 hour management of active medical problems listed below. Physiatrist and rehab team continue to assess barriers to discharge/monitor patient progress toward functional and medical goals  Care Tool:  Bathing    Body parts bathed by patient: Right arm, Left arm, Chest, Abdomen, Front perineal area, Right upper leg, Buttocks, Left upper leg, Right lower leg, Left lower leg, Face         Bathing assist Assist Level: Contact Guard/Touching assist     Upper Body Dressing/Undressing Upper body dressing   What is the patient wearing?: Bra, Pull over shirt    Upper body assist Assist Level: Minimal Assistance - Patient > 75%    Lower Body Dressing/Undressing Lower body dressing      What is the patient wearing?: Underwear/pull up, Pants     Lower body assist Assist for lower body dressing: Contact Guard/Touching assist     Toileting Toileting    Toileting assist Assist for toileting: Contact Guard/Touching assist     Transfers Chair/bed  transfer  Transfers assist     Chair/bed transfer assist level: Minimal Assistance - Patient > 75%     Locomotion Ambulation   Ambulation assist      Assist level: Contact Guard/Touching assist Assistive device: Walker-rolling Max distance: 125   Walk 10 feet activity   Assist     Assist level: Contact Guard/Touching assist Assistive device: Walker-rolling   Walk 50 feet activity   Assist    Assist level: Contact Guard/Touching assist Assistive device: Walker-rolling    Walk 150 feet activity   Assist Walk 150 feet activity did not occur: Safety/medical concerns (dizziness)         Walk 10 feet on uneven surface  activity   Assist Walk 10 feet on uneven surfaces activity did not occur:  Safety/medical concerns (dizziness)         Wheelchair     Assist Is the patient using a wheelchair?: Yes Type of Wheelchair: Manual    Wheelchair assist level: Minimal Assistance - Patient > 75% Max wheelchair distance: 30    Wheelchair 50 feet with 2 turns activity    Assist    Wheelchair 50 feet with 2 turns activity did not occur:  (bil UE fatigue; pt propelled 30')   Assist Level: Moderate Assistance - Patient 50 - 74%   Wheelchair 150 feet activity     Assist  Wheelchair 150 feet activity did not occur:  (bil UE fatigue; pt propelled 30')   Assist Level: Total Assistance - Patient < 25%   Blood pressure (!) 146/58, pulse 82, temperature 98.1 F (36.7 C), temperature source Oral, resp. rate 18, weight 56.9 kg, SpO2 100 %.  Medical Problem List and Plan: 1. Functional deficits secondary to debility secondary to sepsis.              -patient may shower             -ELOS/Goals: 12/14, sup            -Continue CIR therapies including PT, OT     2.  Antithrombotics: -DVT/anticoagulation:  Pharmaceutical: Lovenox>> start 30 mg daily             -antiplatelet therapy: None 3. Pain Management: Scheduled Tylenol '650mg'$  for back pain.  Continue Lidoderm patches.  Aquathermia pad. 4. Mood/Behavior/Sleep: LCSW to evaluate and provide emotional support             -antipsychotic agents: n/a 5. Neuropsych/cognition: This patient is capable of making decisions on her own behalf. 6. Skin/Wound Care: Routine skin care checks 7. Fluids/Electrolytes/Nutrition: Strict I's and O's and follow-up chemistries             -Ensure with meals             -change to carb modified diet   12/3 intake remains poor-20-25% at best yesterday -continue trial of megace to boost appetite -recent bmet wnl except for mild CR elevation which has been baseline -12/4 Cr up to 1.8, Bun 32, start low dose IVF 86m/hr NS -12/5 Cr a little improved to 1.68, continue IVF for now for AKI -12/7  will dc IVF today, Cr down to 1.48 -12/8 recheck labs today  8: Paroxysmal atrial fibrillation: s/p ILR on 06/2021; no AC    9: Sinus node dysfunction s/p [employer] pacemaker 01/05/2022; Dr. GBeckie Salts            -subsequent generator removal 11/27             -  continue Pacerone 200 mg daily              10: Diastolic dysfunction: Daily weight, monitor electrolytes             -continue Lasix 20 mg daily -daily weight trending up, will stop IVF -Weight stable today, continue to monitor   Filed Weights   02/07/22 0458 02/08/22 0608 02/09/22 0418  Weight: 56 kg 57.2 kg 56.9 kg      11: Diabetes mellitus: CBGs q AC and q HS; carb modified diet             -continue SSI (may be able to dc when on CM diet)             -at home on Tresiba 6-8 units at bedtime, Novolog 3 units with meals   CBG (last 3)  Recent Labs    02/09/22 2053 02/10/22 0603 02/10/22 1212  GLUCAP 140* 137* 202*     12/3  fair control so far but she's still not eating much  12/5 will start 2u novolog with meals  12/8 increase to 3u novolog with meals today 12: Gastroesophageal reflux disease: continue Protonix   13: Hyperlipidemia: follow-up LFTs/restart Lipitor 20 mg daily at discharge   14: Rheumatoid arthritis: continue prednisone 5 mg daily   -12/4 Voltaren gel for L knee pain started  15: Paroxysmal atrial fibrillation: s/p ILR on 06/2021; no AC              -Zio patch placed today   16: Chronic kidney disease: Cr consistently around 1.2-1.3   17: Hypertension: monitor TID and prn             -continue Norvasc 10 mg daily             -continue Lasix 20 mg daily  -12/4 BP low today, continue to hold norvasc and lasix  12/5 continue to hold norvasc and lasix, BP still a little soft  12/7 well controled today, continue to hold norvasc      Vitals:   02/09/22 1954 02/10/22 0337  BP: (!) 152/68 (!) 146/58  Pulse: 74 82  Resp: 17 18  Temp: 98.5 F (36.9 C) 98.1 F (36.7 C)  SpO2: 100% 100%   May  resume low dose amlodipine  18: Nausea: will schedule Zofran prior to antibiotic administration   19: Periostitis of lumbar spine without osteomyelitis/MSSA bacteremia:             -Cefazolin 2 grams twice daily through 12/6  -Per ID Dr. Tommy Medal 6 weeks of IV ancef given intracardiac device infection, recheck imaging prior to stopping IV abx in case she has psoas abscess  WIll clarify with ID, ? Which  imaging is needed and whether Linezolid will be needed       Latest Ref Rng & Units 02/09/2022   10:42 AM 02/08/2022    4:25 AM 02/05/2022    4:15 AM  CBC  WBC 4.0 - 10.5 K/uL 5.1  5.0  7.6   Hemoglobin 12.0 - 15.0 g/dL 8.5  7.6  8.3   Hematocrit 36.0 - 46.0 % 26.8  23.8  25.3   Platelets 150 - 400 K/uL 243  226  226       20: History of C. difficile colitis: No evidence of recurrent infection             -continue oral vancomycin 125 mg BID while on cefazolin and 1 week thereafter             -  continue Florastor  -12/6 ID supportive continued prophylactic vancomycin- has loose stools but may be abx assosciated   21: Zoster, left posterior hip: continue valacylovir 1000 mg daily   -now just on contact precautions  22. Anemia:  Hgb fluctuating between 8 and 10. Likely d/t chronic disease/CKD  -check Iron panel tomorrow  -12/7 HGB down to 7.6 today, recheck occult blood stool test  -recheck CBC ordered  23. Constipation  C/o incont loose stool but not recorded as such  24. Poor appetite   -Continue megace , family reports appetite is improving  LOS: 8 days A FACE TO FACE EVALUATION WAS PERFORMED  Charlett Blake 02/10/2022, 1:35 PM

## 2022-02-10 NOTE — Progress Notes (Signed)
Physical Therapy Session Note  Patient Details  Name: Shelby Walker MRN: [id number] Date of Birth: 01-Oct-1933  Today's Date: 02/10/2022 PT Individual Time: 0904-1000 PT Individual Time Calculation (min): 56 min   Short Term Goals: Week 1:  PT Short Term Goal 1 (Week 1): = LTGs due to ELOS  Skilled Therapeutic Interventions/Progress Updates:    Pt received exiting bathroom with NT and pt agreeable to therapy session; therefore, therapist assumed care of pt. Standing at sink, performed hand hygiene with CGA/close supervision. Pt requesting for assistance to finish other grooming tasks sitting EOB due to fatigue with standing. Therapist set-up dentures and pt able to place fixodent and put in dentures then comb her hair. Donned tennis shoes total assist for time management.  Short distance ambulatory transfer EOB>w/c using RW with CGA for safety.  Transported to/from gym in w/c for time management and energy conservation. IV team present to disconnect IV. Zio patch monitor kept with pt at all times.   Pt reports she is feeling better compared to yesterday.   Gait training ~146f using RW with CGA for safety but no significant instability or LOB noted - achieves reciprocal stepping pattern with slow gait speed.  Pt unable to recall where we were sitting in the gym and states she has had memory impairments as well as impaired hearing and eye sight since this hospitalization.   Repeated sit<>stands to/from EOM with limited UE support targeting B LE strengthening and balance due to minor posterior LOB upon standing - CGA for safety but after 6reps pt reports onset of L knee pain and discontinues exercise.  Transitioned to seated long arc quads x20 reps each LE then added 3lb weight to L LE but pt reports pain/discomfort again after ~3reps so discontinued - then tried isometric holds and less painful but pt apprehensive to continue.   Tried transitioning back to sit<>stands from elevated EOM but  after 2 reps pt requests to discontinue due to fear of L knee pain and repercussions of pain she may have tomorrow after doing this.  Dyamic standing balance and B LE strengthening via alternating foot taps to 4" step with R HHA providing min assist for balance 2sets of 10reps - pt noted to have minor L hip drop during stance - pt reports feeling weak in L LE when standing on it.   Pt requires frequent seated rest breaks thorughout session due to fatigue.  Dynamic balance challenge of lateral stepping over hockey stick with R HHA and min assist for balance - pt noted to take smaller steps with L LE compared to R.   Transported back to her room and pt agreeable to remain up in w/c - left with needs in reach and seat belt alarm on.     Therapy Documentation Precautions:  Precautions Precautions: Fall Precaution Comments: watch vitals Restrictions Weight Bearing Restrictions: No   Pain:  Reports L knee is "sore" and then has pain with sit<>stand exercise - modified interventions and provided rest as noted above.    Therapy/Group: Individual Therapy  CTawana Scale, PT, DPT, NCS, CSRS 02/10/2022, 7:49 AM

## 2022-02-10 NOTE — Progress Notes (Signed)
Occupational Therapy Session Note  Patient Details  Name: NATALEAH SCIONEAUX MRN: [id number] Date of Birth: 05/24/1933  Today's Date: 02/10/2022 OT Individual Time: 1121-1203 OT Individual Time Calculation (min): 42 min    Short Term Goals: Week 1:  OT Short Term Goal 1 (Week 1): STG=LTG 2/2 ELOS  Skilled Therapeutic Interventions/Progress Updates:    Pt received in w/c with no pain reporting need to go to bathroom prior to tx. Pt distraught when in bathroom realized had incontinent BM In underwear. Provided gentle encouragemetn to shower for full cleanliness  ADL: Pt completes ADL at overall CGA Level. Skilled interventions include: cuing fo r RW management, edu re hand placemnet during transitional movments, sequencing of threading head first in shirt then arms for improved UB dressing and A to thoroughly cleanse bottom on padded bench with cut out in shower. Occlusives applied to zio as well as seal wrap over PICC to keep dry and clean  Pt left at end of session in w/c with exit alarm on, call light in reach and all needs met   Therapy Documentation Precautions:  Precautions Precautions: Fall Precaution Comments: watch vitals Restrictions Weight Bearing Restrictions: No  Therapy/Group: Individual Therapy  Tonny Branch 02/10/2022, 6:32 AM

## 2022-02-10 NOTE — Progress Notes (Signed)
Occupational Therapy Session Note  Patient Details  Name: Shelby Walker MRN: [id number] Date of Birth: [birth year]  Today's Date: 02/10/2022 OT Individual Time: 1500-1545 OT Individual Time Calculation (min): 45 min    Short Term Goals: Week 1:  OT Short Term Goal 1 (Week 1): STG=LTG 2/2 ELOS  Skilled Therapeutic Interventions/Progress Updates:    Upon OT arrival, pt semi recumbent in bed reporting 9/10 pain to L hip. Pt agreeable to OT session. Treatment intervention with a focus on functional mobility, endurance, and strengthening. Pt completes supine to sit transfer independently and donns shoes with setup. Pt completes stand step transfer with RW and SBA and ambulates to dayroom. Pt requires seated rest break before making it to the table. Pt was transported remainder of way. While seated at tabletop, pt flips over and sorts quirkle game pieces using B UE with min difficulty but no errors. Game pieces were divided on B sides of tabletop and pt retrieves tiles one at a time while incorporating crossing midline and returns them to the game bag. Min rest breaks required to complete. Pt completes stand step transfer with RW and SBA and ambulates back to her room without LOB or rest break. Pt completes sit to supine transfer independently and was left in bed at end of session with all needs met and safety measures in place.   Therapy Documentation Precautions:  Precautions Precautions: Fall Precaution Comments: watch vitals Restrictions Weight Bearing Restrictions: No   Therapy/Group: Individual Therapy  Chaelyn Bunyan 02/10/2022, 3:32 PM

## 2022-02-11 LAB — GLUCOSE, CAPILLARY
Glucose-Capillary: 158 mg/dL — ABNORMAL HIGH (ref 70–99)
Glucose-Capillary: 226 mg/dL — ABNORMAL HIGH (ref 70–99)
Glucose-Capillary: 219 mg/dL — ABNORMAL HIGH (ref 70–99)
Glucose-Capillary: 162 mg/dL — ABNORMAL HIGH (ref 70–99)

## 2022-02-11 NOTE — Progress Notes (Signed)
Physical Therapy Session Note  Patient Details  Name: Shelby Walker MRN: [id number] Date of Birth: 1933-04-11  Today's Date: 02/11/2022 PT Individual Time: 1300-1400 PT Individual Time Calculation (min): 60 min   Short Term Goals: Week 1:  PT Short Term Goal 1 (Week 1): = LTGs due to ELOS  Skilled Therapeutic Interventions/Progress Updates:    Pt received seated in bed with daughter and son-in-law present. Pt reports ongoing chronic L knee pain, utilized repositioning and rest breaks as needed and provided short-acting hot pack at end of session for pain management. Bed mobility mod I with HOB elevated and use of bedrail. Sit to stand and transfers with RW and Supervision throughout session. Ambulatory transfer into bathroom with RW and CGA. Toilet transfer with Supervision and RW, setup A for 3/3 toileting steps. Ambulation x 100 ft, x 200 ft with RW and CGA for balance, increase in L knee and hip pain with mobility leading to antalgic gait pattern. Static standing balance with RW and CGA while playing cornhole. Ascend/descend 4 x 6" stairs x 2 reps with L handrail laterally to simulate home environment, CGA for balance. Standing alt L/R 4" step taps with progression from BUE support on RW to one UE support on RW, CGA to min A for balance, 3 x 10 reps. Pt returned to bed at end of session, left seated in bed with needs in reach, family present.  Therapy Documentation Precautions:  Precautions Precautions: Fall Precaution Comments: watch vitals Restrictions Weight Bearing Restrictions: No      Therapy/Group: Individual Therapy   Excell Seltzer, PT, DPT, CSRS  02/11/2022, 2:11 PM

## 2022-02-11 NOTE — Progress Notes (Deleted)
Cardiology Office Note Date:  02/11/2022  Patient ID:  Shelby Walker, Shelby Walker 05-11-1933, MRN [id number] PCP:  Reynold Bowen, MD  Electrophysiologist: Dr. Caryl Comes  ***refresh   Chief Complaint: *** s/p PPM extraction  History of Present Illness: Shelby Walker is a 88 y.o. female with history of CAD (Coronary CTA 10/23 noted moderate (50-69%) mixed plaque in the left main and LAD.Can not rule out severe (>70%) splenosis in OM1), diastolic dysfunction, V7OH, HLD, parox afib, CKD, RA (on chronic steroids)  Had an ILR for Afib  burden, was found to have pauses, one as long as 25 seconds reportedly asymptomatic, though also apparently quite ill with COVID, spending much of her time in bed sleeping and subsequently recovered from [facility].   Admitted to the hospital found with no reversible causes and underwent PPM implant 01/05/22   Admitted 01/24/22 with a day of reported abd pain, N/V/D developed AMS, hypoxic, septic, felt 2/2 pyelonephritis, seen on imaging as well as dirty UA with pyuria and leukocytes.  Lactic acid elevated. Required vasopressors.  AKI/CKD, hypoxia felt to be spurious reading, VQ was negative, and weaning O2 quickly, found with some pleural effusions. She was febrile to 103 ID consulted  MSSA bacteremia, did NOT think she had GU infection. Neg c.Diff (with this historically).  Concerns of pacer involvement and requested EP input 01/24/22 BC 2of2 w/staph (MSSA by ID note) Urine w/ecoli and pseudomonas During this stay she developed AFib and started on amiodarone Underwent PPM system removal 01/29/22 with no plans for reimplant D/c to rehab with [facility]  EP asked to see her with unusual fatigue 02/09/22 at [facility], HRs were OK on vitals as well as BP, Zio reviewed with no bradycardia or arrhythmia Wound looked great Anemic chronically  *** site *** Zio *** loop? *** amio labs  AFib/AAD hx Diagnosis goes back to 2010, discussed as very brief episode of AF vs reentrant  tachycardia 06/05/21 note with Dr. Caryl Comes discussed AFib associated with Cdiff infection and with no AFib on ILR, stopped Eliquis  Had episode of AFib during hospitalization for sepsis, Nov 2023 Discussed [place] resumption, pt family declined [place] with reports of problems with falls and injury while on [facility] in the past.  Amiodarone started Nov 2023    Past Medical History:  Diagnosis Date   Bruit    Carotid Doppler showed no significant abnormality     9per patient)   C. difficile colitis 07/2018   with severe sepsis   Chest pain, unspecified    Nuclear, May, 2008, no scar or ischemia   Diabetes mellitus    Diverticulosis    Dyslipidemia    Ejection fraction    EF 55-60%, echo, February, 2011 // Echocardiogram 8/21: EF 60-65, no RWMA, Gr 1 DD, GLS -14%, normal RVSF, mild LAE, trivial MR, mild MS (mean 4 mmHg), RVSP 23.4    GERD (gastroesophageal reflux disease)    Mitral regurgitation 04/21/2009   mild,  echo, February, 2011   Osteoporosis    Palpitations    possible very brief atrial fibrillation on monitor and possible reentrant tachycardia   Psoriasis    Rheumatoid arthritis ([place])     Past Surgical History:  Procedure Laterality Date   ABDOMINAL AORTOGRAM W/LOWER EXTREMITY Right 10/19/2020   Procedure: ABDOMINAL AORTOGRAM W/LOWER EXTREMITY;  Surgeon: Wellington Hampshire, MD;  Location: [place] [facility];  Service: Cardiovascular;  Laterality: Right;   FRACTURE SURGERY     LOOP RECORDER REMOVAL N/A 01/05/2022  Procedure: LOOP RECORDER REMOVAL;  Surgeon: Evans Lance, MD;  Location: [place] [facility];  Service: Cardiovascular;  Laterality: N/A;   PACEMAKER IMPLANT N/A 01/05/2022   Procedure: PACEMAKER IMPLANT;  Surgeon: Evans Lance, MD;  Location: [place] [facility];  Service: Cardiovascular;  Laterality: N/A;   PPM GENERATOR REMOVAL N/A 01/29/2022   Procedure: PPM GENERATOR REMOVAL;  Surgeon: Evans Lance, MD;  Location: [place] [facility];  Service: Cardiovascular;   Laterality: N/A;   TEE WITHOUT CARDIOVERSION N/A 01/29/2022   Procedure: TRANSESOPHAGEAL ECHOCARDIOGRAM (TEE);  Surgeon: Geralynn Rile, MD;  Location: [place];  Service: Cardiovascular;  Laterality: N/A;   TRANSMETATARSAL AMPUTATION Right 01/18/2021   Procedure: TRANSMETATARSAL AMPUTATION;  Surgeon: Wylene Simmer, MD;  Location: [place];  Service: Orthopedics;  Laterality: Right;    No current facility-administered medications for this visit.   No current outpatient medications on file.   Facility-Administered Medications Ordered in Other Visits  Medication Dose Route Frequency Provider Last Rate Last Admin   0.9 %  sodium chloride infusion   Intravenous PRN Jennye Boroughs, MD 10 mL/hr at 02/10/22 2158 10 mL/hr at 02/10/22 2158   acetaminophen (TYLENOL) tablet 325 mg  325 mg Oral Q6H Barbie Banner, PA-C   325 mg at 02/11/22 0543   alum & mag hydroxide-simeth (MAALOX/MYLANTA) 200-200-20 MG/5ML suspension 30 mL  30 mL Oral Q4H PRN Barbie Banner, PA-C       amiodarone (PACERONE) tablet 200 mg  200 mg Oral Daily Barbie Banner, PA-C   200 mg at 02/11/22 9562   ceFAZolin (ANCEF) IVPB 2g/100 mL premix  2 g Intravenous Q12H Francena Hanly, RPH 200 mL/hr at 02/10/22 2246 2 g at 02/10/22 2246   Chlorhexidine Gluconate Cloth 2 % PADS 6 each  6 each Topical Q12H Jennye Boroughs, MD   6 each at 02/10/22 1611   diclofenac Sodium (VOLTAREN) 1 % topical gel 4 g  4 g Topical QID Jennye Boroughs, MD   4 g at 02/11/22 1308   diphenhydrAMINE (BENADRYL) 12.5 MG/5ML elixir 12.5-25 mg  12.5-25 mg Oral Q6H PRN Barbie Banner, PA-C       docusate sodium (COLACE) capsule 100 mg  100 mg Oral BID PRN Barbie Banner, PA-C   100 mg at 02/04/22 2031   enoxaparin (LOVENOX) injection 30 mg  30 mg Subcutaneous Q24H Barbie Banner, PA-C   30 mg at 02/10/22 2147   feeding supplement (ENSURE ENLIVE / ENSURE PLUS) liquid 237 mL  237 mL Oral BID WC Barbie Banner, PA-C   237 mL at 02/11/22 6578    guaiFENesin-dextromethorphan (ROBITUSSIN DM) 100-10 MG/5ML syrup 5-10 mL  5-10 mL Oral Q6H PRN Barbie Banner, PA-C       insulin aspart (novoLOG) injection 0-9 Units  0-9 Units Subcutaneous TID AC & HS Meredith Staggers, MD   2 Units at 02/11/22 0657   insulin aspart (novoLOG) injection 3 Units  3 Units Subcutaneous TID WC Jennye Boroughs, MD   3 Units at 02/11/22 0938   lidocaine (LIDODERM) 5 % 1 patch  1 patch Transdermal Q24H Barbie Banner, PA-C   1 patch at 02/11/22 4696   magic mouthwash  10 mL Oral QID Barbie Banner, PA-C   10 mL at 02/11/22 2952   megestrol (MEGACE) 400 MG/10ML suspension 400 mg  400 mg Oral BID Meredith Staggers, MD   400 mg at 02/11/22 0936   methocarbamol (ROBAXIN) tablet 500 mg  500 mg Oral Q6H PRN Barbie Banner, PA-C   500 mg at 02/10/22 2143   multivitamin (RENA-VIT) tablet 1 tablet  1 tablet Oral QHS Barbie Banner, PA-C   1 tablet at 02/10/22 2144   ondansetron (ZOFRAN) injection 4 mg  4 mg Intravenous BID Barbie Banner, PA-C   4 mg at 02/11/22 8563   pantoprazole (PROTONIX) EC tablet 40 mg  40 mg Oral Daily Barbie Banner, PA-C   40 mg at 02/11/22 1497   polyethylene glycol (MIRALAX / GLYCOLAX) packet 17 g  17 g Oral Daily PRN Barbie Banner, PA-C       predniSONE (DELTASONE) tablet 5 mg  5 mg Oral Q breakfast Barbie Banner, PA-C   5 mg at 02/11/22 0263   saccharomyces boulardii (FLORASTOR) capsule 250 mg  250 mg Oral BID Barbie Banner, PA-C   250 mg at 02/11/22 7858   sodium chloride flush (NS) 0.9 % injection 10-40 mL  10-40 mL Intracatheter Q12H Shirley Friar, PA-C   10 mL at 02/11/22 0944   sodium chloride flush (NS) 0.9 % injection 10-40 mL  10-40 mL Intracatheter PRN Shirley Friar, PA-C   10 mL at 02/09/22 1016   sodium chloride flush (NS) 0.9 % injection 3 mL  3 mL Intravenous Q12H Barbie Banner, PA-C   3 mL at 02/11/22 0944   sodium phosphate (FLEET) 7-19 GM/118ML enema 1 enema  1 enema Rectal Once PRN  Barbie Banner, PA-C       sorbitol 70 % solution 30 mL  30 mL Oral Daily PRN Barbie Banner, PA-C       sorbitol 70 % solution 45 mL  45 mL Oral Once Jennye Boroughs, MD       traZODone (DESYREL) tablet 25-50 mg  25-50 mg Oral QHS PRN Barbie Banner, PA-C   50 mg at 02/07/22 2053   vancomycin (VANCOCIN) capsule 125 mg  125 mg Oral BID Barbie Banner, PA-C   125 mg at 02/11/22 8502    Allergies:   Cholestyramine, Compazine [prochlorperazine edisylate], Sulfonamide derivatives, Hydrocodone, Infliximab, Leflunomide, Prochlorperazine, Rosuvastatin, and Sulfa antibiotics   Social History:  The patient  reports that she has never smoked. She has never used smokeless tobacco. She reports that she does not drink alcohol and does not use drugs.   Family History:  The patient's family history includes Diabetes in her father and mother; Heart attack in her father and mother; Heart failure in her father and mother; Stroke in her sister.***  ROS:  Please see the history of present illness.    All other systems are reviewed and otherwise negative.   PHYSICAL EXAM:  VS:  There were no vitals taken for this visit. BMI: There is no height or weight on file to calculate BMI. Well nourished, well developed, in no acute distress HEENT: normocephalic, atraumatic Neck: no JVD, carotid bruits or masses Cardiac:  *** RRR; no significant murmurs, no rubs, or gallops Lungs:  *** CTA b/l, no wheezing, rhonchi or rales Abd: soft, nontender MS: no deformity or *** atrophy Ext: *** no edema Skin: warm and dry, no rash Neuro:  No gross deficits appreciated Psych: euthymic mood, full affect  *** PPM extraction site is stable, no tethering or discomfort   EKG:  not done today  01/29/22: TEE No endocarditis detected.  Normal LV/RV function.  Full report to follow. Further management per primary team.  TTE 01/25/22  1. Left ventricular ejection fraction, by estimation, is 50 to 55%. The  left  ventricle has low normal function. The left ventricle has no regional  wall motion abnormalities. Left ventricular diastolic parameters are  consistent with Grade II diastolic  dysfunction (pseudonormalization).   2. Right ventricular systolic function is normal. The right ventricular  size is normal. There is normal pulmonary artery systolic pressure. The  estimated right ventricular systolic pressure is 52.0 mmHg.   3. Left atrial size was moderately dilated.   4. The mitral valve is degenerative. Mild mitral valve regurgitation. The  mean mitral valve gradient is 4.0 mmHg with average heart rate of 77 bpm.   5. The aortic valve is tricuspid. There is mild calcification of the  aortic valve. There is mild thickening of the aortic valve. Aortic valve  regurgitation is not visualized. Aortic valve sclerosis is present, with  no evidence of aortic valve stenosis.   6. The inferior vena cava is dilated in size with <50% respiratory  variability, suggesting right atrial pressure of 15 mmHg.   Recent Labs: 01/25/2022: B Natriuretic Peptide 2,919.6 02/02/2022: Magnesium 1.7 02/09/2022: ALT 5; BUN 18; Creatinine, Ser 1.47; Hemoglobin 8.5; Platelets 243; Potassium 4.2; Sodium 138  01/05/2022: Cholesterol 149; HDL 50; LDL Cholesterol 71; Total CHOL/HDL Ratio 3.0; Triglycerides 138; VLDL 28   Estimated Creatinine Clearance: 20.7 mL/min (A) (by C-G formula based on SCr of 1.47 mg/dL (H)).   Wt Readings from Last 3 Encounters:  02/09/22 125 lb 7.1 oz (56.9 kg)  02/02/22 116 lb 10 oz (52.9 kg)  01/06/22 110 lb 14.4 oz (50.3 kg)     Other studies reviewed: Additional studies/records reviewed today include: summarized above  ASSESSMENT AND PLAN:  SND/pauses PPM extracted 2/2 MSSA bacteremia ***  Paroxysmal AFib CHA2DS2Vasc is 5, pt/family decline a/c Amiodarone ***  Disposition: F/u with ***  Current medicines are reviewed at length with the patient today.  The patient did not have any  concerns regarding medicines.  Venetia Night, PA-C 02/11/2022 10:46 AM     [facility] [place]  [address] [phone] (office)  [phone] (fax)

## 2022-02-11 NOTE — Progress Notes (Signed)
Occupational Therapy Session Note  Patient Details  Name: Shelby Walker MRN: [id number] Date of Birth: 03/24/1933  Today's Date: 02/11/2022 OT Individual Time: 1015-1100 & 1445-1515 OT Individual Time Calculation (min): 45 min & 30 min   Short Term Goals: Week 1:  OT Short Term Goal 1 (Week 1): STG=LTG 2/2 ELOS  Skilled Therapeutic Interventions/Progress Updates:  Session 1:   Upon OT arrival, pt seated EOB requesting to use the bathroom. Pt agreeable to OT session. Treatment intervention with a focus on self care retraining. Pt completes stand step transfer with close supervision using RW and completes toilet transfer and toileting with SBA. Pt ambulates to sink to complete hand hygiene with SBA and returns to bed. Pt completes stand step transfer with SBA to retrieve clothing from closet with SBA. Pt returns to bed to change her shirt with Supervision. Pt retrieves comb from [employer] drawer with SBA. Pt donns shoes with Setup assist and then ambulates to dayroom gym via RW and SBA. Pt completes multiple sit<> stand transfers with Supervision to retrieve playing cards from velcro boards on vertical surface. Pt then stands to sort playing cards back onto boards performing multiple sit<>stand transfers with SBA. No LOB observed. Pt ambulates back to her room with SBA using RW and transitions to supine independently. Pt was left in bed at end of session with all needs met and safety measures in place.   Session 2: Upon OT arrival, pt semi recumbent in bed. Pt agreeable to OT session. Treatment intervention with a focus on strengthening. Pt completes stand pivot transfer with Supervision and was transported to ortho gym via w/c and total A for time management. Pt completes arm bike using B UE for 8 minutes with one short rest break after 5 minutes. Pt was transported to dayroom gym via w/c for time management and sits at tabletop to place graded clips onto horizontal rods using B UE. Pt unable to place  black and blue clips secondary to decreased strength and RA. Pt places and removes yellow, red, and green clips 2x20 times with min difficulty. Pt was transported back to her room via w/c and total A and completes stand pivot transfer into bed with Supervision. Pt returns to supine independently and was left in bed at end of session with all needs met and safety measures in place.   Therapy Documentation Precautions:  Precautions Precautions: Fall Precaution Comments: watch vitals Restrictions Weight Bearing Restrictions: No   Therapy/Group: Individual Therapy  Janequa Kipnis 02/11/2022, 10:28 AM

## 2022-02-12 ENCOUNTER — Ambulatory Visit: Payer: Medicare Other | Admitting: Physician Assistant

## 2022-02-12 DIAGNOSIS — K5900 Constipation, unspecified: Secondary | ICD-10-CM

## 2022-02-12 LAB — CBC
HCT: 26.3 % — ABNORMAL LOW (ref 36.0–46.0)
Hemoglobin: 8.1 g/dL — ABNORMAL LOW (ref 12.0–15.0)
MCH: 29.5 pg (ref 26.0–34.0)
MCHC: 30.8 g/dL (ref 30.0–36.0)
MCV: 95.6 fL (ref 80.0–100.0)
Platelets: 217 10*3/uL (ref 150–400)
RBC: 2.75 MIL/uL — ABNORMAL LOW (ref 3.87–5.11)
RDW: 17.3 % — ABNORMAL HIGH (ref 11.5–15.5)
WBC: 5.3 10*3/uL (ref 4.0–10.5)
nRBC: 0.4 % — ABNORMAL HIGH (ref 0.0–0.2)

## 2022-02-12 LAB — GLUCOSE, CAPILLARY
Glucose-Capillary: 126 mg/dL — ABNORMAL HIGH (ref 70–99)
Glucose-Capillary: 252 mg/dL — ABNORMAL HIGH (ref 70–99)
Glucose-Capillary: 161 mg/dL — ABNORMAL HIGH (ref 70–99)
Glucose-Capillary: 100 mg/dL — ABNORMAL HIGH (ref 70–99)

## 2022-02-12 LAB — BASIC METABOLIC PANEL
Anion gap: 9 (ref 5–15)
BUN: 26 mg/dL — ABNORMAL HIGH (ref 8–23)
CO2: 22 mmol/L (ref 22–32)
Calcium: 8.2 mg/dL — ABNORMAL LOW (ref 8.9–10.3)
Chloride: 109 mmol/L (ref 98–111)
Creatinine, Ser: 1.62 mg/dL — ABNORMAL HIGH (ref 0.44–1.00)
GFR, Estimated: 31 mL/min — ABNORMAL LOW (ref >60)
Glucose, Bld: 152 mg/dL — ABNORMAL HIGH (ref 70–99)
Potassium: 4.8 mmol/L (ref 3.5–5.1)
Sodium: 140 mmol/L (ref 135–145)

## 2022-02-12 NOTE — Plan of Care (Signed)
  Problem: RH Balance Goal: LTG: Patient will maintain dynamic sitting balance (OT) Description: LTG:  Patient will maintain dynamic sitting balance with assistance during activities of daily living (OT) Outcome: Completed/Met

## 2022-02-12 NOTE — Progress Notes (Signed)
Physical Therapy Session Note  Patient Details  Name: Shelby Walker MRN: [id number] Date of Birth: [birth year]  Today's Date: 02/12/2022 PT Individual Time: 1030-1130 PT Individual Time Calculation (min): 60 min   Short Term Goals: Week 1:  PT Short Term Goal 1 (Week 1): = LTGs due to ELOS  Skilled Therapeutic Interventions/Progress Updates:    Pt seated in w/c on arrival and agreeable to therapy. Pt reports chronic OA pain in L knee and hip, premedicated. Rest and positioning provided as needed. Pt transported to therapy gym for time management and energy conservation. Pt navigated step + threshold x 2 with grab bar to simulate home environment. Pt performed with CGA overall but did require intermittent cues for safety. Pt requesting to work on UE strength, so propelled w/c with BUE 3 x 80-100 ft. Pt requires rest breaks d/t fatigue and UE pain. Pt then performed [facility] press with 2 lb bar, limited by pain and shoulder ROM. Pt then performed "rowing" motion in limited range with 1lb bar, 3 x 4-6. Pt then ambulates x 150 ft with close supervision and RW. Pt returned to bed with supervision and was left with all needs in reach and alarm active.   Therapy Documentation Precautions:  Precautions Precautions: Fall Precaution Comments: watch vitals Restrictions Weight Bearing Restrictions: No General:       Therapy/Group: Individual Therapy  Mickel Fuchs 02/12/2022, 11:04 AM

## 2022-02-12 NOTE — Progress Notes (Signed)
Occupational Therapy Session Note  Patient Details  Name: Shelby Walker MRN: [id number] Date of Birth: Jun 24, 1933  Today's Date: 02/12/2022 OT Individual Time: 0915-1000 OT Individual Time Calculation (min): 45 min    Short Term Goals: Week 1:  OT Short Term Goal 1 (Week 1): STG=LTG 2/2 ELOS  Skilled Therapeutic Interventions/Progress Updates:    Upon OT arrival, pt resting in bed. Pt easily awakened by voice and is agreeable to OT session. Pt requesting to use the bathroom. Treatment intervention with a focus on self care retraining and endurance. Pt transitions to EOB independently and performs stand step transfer with RW and Supervision to bathroom to complete toilet transfer and toileting with supervision. Pt transitions to sink to complete hand hygiene, oral care, and wash face with Supervision. Pt changes shirt with Min A and pants with Supervision. Pt transitions to bed with RW and Supervision and donns socks and shoes with Supervision. Pt completes 2x10 reps of sit<>stand transfers with Supervision with rest break in between sets. Pt combs her hair again with Setup assist EOB. Pt transfers into w/c with Supervision and was left in w/c at end of session with all needs met and safety measures in place.   Therapy Documentation Precautions:  Precautions Precautions: Fall Precaution Comments: watch vitals Restrictions Weight Bearing Restrictions: No   Therapy/Group: Individual Therapy  Oluchi Pucci 02/12/2022, 9:41 AM

## 2022-02-12 NOTE — Progress Notes (Signed)
Physical Therapy Weekly Progress Note  Patient Details  Name: Shelby Walker MRN: [id number] Date of Birth: 1933/03/29  Beginning of progress report period: February 03, 2022 End of progress report period: February 12, 2022  Today's Date: 02/12/2022 PT Individual Time: 1030-1130 PT Individual Time Calculation (min): 60 min   Patient has met 1 of 1 short term goals.  Pt continues to progress toward LTGs. She is ambulating >150 ft with LRAD and supervision. Her family has participated in hands on family education. Pt lives alone but with family nearby and pt's daughter plans to stay with her ~1 week. Pt is limited by chronic pain and fatigue but demoes improving mobility and functional strength.   Patient continues to demonstrate the following deficits muscle weakness, decreased cardiorespiratoy endurance, decreased memory, and decreased balance strategies and therefore will continue to benefit from skilled PT intervention to increase functional independence with mobility.  Patient progressing toward long term goals..  Continue plan of care.  PT Short Term Goals Week 1:  PT Short Term Goal 1 (Week 1): = LTGs due to ELOS PT Short Term Goal 1 - Progress (Week 1): Met Week 2:  PT Short Term Goal 1 (Week 2): =LTGs d/t ELOS  Skilled Therapeutic Interventions/Progress Updates:  Ambulation/gait training;Cognitive remediation/compensation;Discharge planning;DME/adaptive equipment instruction;Functional mobility training;Pain management;Splinting/orthotics;Therapeutic Activities;Psychosocial support;UE/LE Strength taining/ROM;Balance/vestibular training;Community reintegration;Neuromuscular re-education;Patient/family education;Stair training;Therapeutic Exercise;UE/LE Coordination activities;Wheelchair propulsion/positioning   Therapy Documentation Precautions:  Precautions Precautions: Fall Precaution Comments: watch vitals Restrictions Weight Bearing Restrictions: No General:      Therapy/Group: Individual Therapy  Mickel Fuchs 02/12/2022, 12:53 PM

## 2022-02-12 NOTE — Progress Notes (Signed)
Occupational Therapy Weekly Progress Note  Patient Details  Name: Shelby Walker MRN: [id number] Date of Birth: 10/19/1933  Beginning of progress report period: February 03, 2022 End of progress report period: February 12, 2022  Today's Date: 02/12/2022 OT Individual Time: 1660-6004 OT Individual Time Calculation (min): 42 min    Patient has met 1 of 14 short term goal as STG=LTG's currently and pt making excellent progress toward overall S level. Pt's family present for session and pleased with progress. OT progressed ADL's, transfers and standing tolerance with B UE HEP provided in writing for STM carryover. Light IADL focus the next visits for simple task progression with energy conservation. Plan for d/c later week at S level and pt approaching level for d/c.   Patient continues to demonstrate the following deficits: muscle weakness, decreased cardiorespiratoy endurance, decreased coordination, decreased memory, and decreased sitting balance, decreased standing balance, and decreased balance strategies and therefore will continue to benefit from skilled OT intervention to enhance overall performance with BADL, iADL, and Reduce care partner burden.  Patient progressing toward long term goals..  Continue plan of care.  OT Short Term Goals Week 1:  OT Short Term Goal 1 (Week 1): STG=LTG 2/2 ELOS Week 2:  OT Short Term Goal 1 (Week 2): STG=LTG 2/2 ELOS to continue to progress toward  Skilled Therapeutic Interventions/Progress Updates:   Reassessment conducted this visit for weekly progress and discharge preparedness. Family present this session and are pleased with progress. Overall CGA for LB self care, S UB self care, close S transfers to toilet and shower. Pt continues to require reinforcement for decreased STM and OT provided written UE HEP to include breathing strategies, gentle prox ROM, distal foam cube squeezes, light tband therex for tricep press and simple light UE cane ex using  reacher (pt has at home) in all planes, all 10 reps. Progressing standing tolerance as pt requires seated rests after 2-3 minutes continuous sink side standing. Pt with no report of pain this session despite UE HEP performance and left with family bedside as per pt request to rest supine after session with bed exit alarm engaged, needs and nurse call button in reach.    Therapy Documentation Precautions:  Precautions Precautions: Fall Precaution Comments: watch vitals Restrictions Weight Bearing Restrictions: No    Therapy/Group: Individual Therapy  Barnabas Lister 02/12/2022, 7:50 AM

## 2022-02-12 NOTE — Progress Notes (Signed)
PROGRESS NOTE   Subjective/Complaints: Reports she feels better today, no new concerns or complaints.   Review of Systems  Constitutional:  Positive for malaise/fatigue. Negative for chills and fever.  HENT:  Negative for congestion.   Eyes:  Negative for double vision.  Respiratory:  Negative for shortness of breath.   Cardiovascular:  Negative for chest pain.  Gastrointestinal:  Negative for abdominal pain, diarrhea, heartburn, nausea and vomiting.  Genitourinary: Negative.   Musculoskeletal:  Positive for back pain.  Skin:  Negative for rash.  Neurological:  Positive for weakness. Negative for headaches.      Objective:   No results found. Recent Labs    02/09/22 1042 02/12/22 0349  WBC 5.1 5.3  HGB 8.5* 8.1*  HCT 26.8* 26.3*  PLT 243 217    Recent Labs    02/09/22 1042 02/11/22 2354  NA 138 140  K 4.2 4.8  CL 109 109  CO2 21* 22  GLUCOSE 323* 152*  BUN 18 26*  CREATININE 1.47* 1.62*  CALCIUM 8.1* 8.2*     Intake/Output Summary (Last 24 hours) at 02/12/2022 8295 Last data filed at 02/12/2022 0620 Gross per 24 hour  Intake 1504.88 ml  Output --  Net 1504.88 ml         Physical Exam: Vital Signs Blood pressure (!) 128/52, pulse 74, temperature 98.2 F (36.8 C), temperature source Oral, resp. rate 18, weight 56.9 kg, SpO2 100 %.   General: No acute distress, seen at bedside Mood and affect are appropriate Heart: Regular rate and rhythm no rubs murmurs or extra sounds Lungs: Clear to auscultation, breathing unlabored, no rales or wheezes Abdomen: Positive bowel sounds, soft nontender to palpation, nondistended Extremities: No clubbing, cyanosis, or edema Psych: very pleasant Neuro:  Alert and oriented x 3. Follows commands. Normal insight and awareness. Intact Memory. Normal language and speech. CN 2-12 intact, follows commands. Motor 4/5 UE, 3->4/5 LE prox to distal. No focal sensory  findings.  Musculoskeletal: TMA well shaped. Normal ROM, fair sitting posture . Mild L knee joint line tenderness   Assessment/Plan: 1. Functional deficits which require 3+ hours per day of interdisciplinary therapy in a comprehensive inpatient rehab setting. Physiatrist is providing close team supervision and 24 hour management of active medical problems listed below. Physiatrist and rehab team continue to assess barriers to discharge/monitor patient progress toward functional and medical goals  Care Tool:  Bathing    Body parts bathed by patient: Right arm, Left arm, Chest, Abdomen, Front perineal area, Right upper leg, Buttocks, Left upper leg, Right lower leg, Left lower leg, Face         Bathing assist Assist Level: Contact Guard/Touching assist     Upper Body Dressing/Undressing Upper body dressing   What is the patient wearing?: Bra, Pull over shirt    Upper body assist Assist Level: Minimal Assistance - Patient > 75%    Lower Body Dressing/Undressing Lower body dressing      What is the patient wearing?: Underwear/pull up, Pants     Lower body assist Assist for lower body dressing: Contact Guard/Touching assist     Toileting Toileting    Toileting assist Assist for toileting:  Contact Guard/Touching assist     Transfers Chair/bed transfer  Transfers assist     Chair/bed transfer assist level: Minimal Assistance - Patient > 75%     Locomotion Ambulation   Ambulation assist      Assist level: Contact Guard/Touching assist Assistive device: Walker-rolling Max distance: 125   Walk 10 feet activity   Assist     Assist level: Contact Guard/Touching assist Assistive device: Walker-rolling   Walk 50 feet activity   Assist    Assist level: Contact Guard/Touching assist Assistive device: Walker-rolling    Walk 150 feet activity   Assist Walk 150 feet activity did not occur: Safety/medical concerns (dizziness)         Walk 10 feet  on uneven surface  activity   Assist Walk 10 feet on uneven surfaces activity did not occur: Safety/medical concerns (dizziness)         Wheelchair     Assist Is the patient using a wheelchair?: Yes Type of Wheelchair: Manual    Wheelchair assist level: Minimal Assistance - Patient > 75% Max wheelchair distance: 30    Wheelchair 50 feet with 2 turns activity    Assist    Wheelchair 50 feet with 2 turns activity did not occur:  (bil UE fatigue; pt propelled 30')   Assist Level: Moderate Assistance - Patient 50 - 74%   Wheelchair 150 feet activity     Assist  Wheelchair 150 feet activity did not occur:  (bil UE fatigue; pt propelled 30')   Assist Level: Total Assistance - Patient < 25%   Blood pressure (!) 128/52, pulse 74, temperature 98.2 F (36.8 C), temperature source Oral, resp. rate 18, weight 56.9 kg, SpO2 100 %.  Medical Problem List and Plan: 1. Functional deficits secondary to debility secondary to sepsis.              -patient may shower             -ELOS/Goals: 12/14, sup            -Continue CIR therapies including PT, OT     2.  Antithrombotics: -DVT/anticoagulation:  Pharmaceutical: Lovenox>> start 30 mg daily             -antiplatelet therapy: None 3. Pain Management: Scheduled Tylenol '650mg'$  for back pain.  Continue Lidoderm patches.  Aquathermia pad. 4. Mood/Behavior/Sleep: LCSW to evaluate and provide emotional support             -antipsychotic agents: n/a 5. Neuropsych/cognition: This patient is capable of making decisions on her own behalf. 6. Skin/Wound Care: Routine skin care checks 7. Fluids/Electrolytes/Nutrition: Strict I's and O's and follow-up chemistries             -Ensure with meals             -change to carb modified diet   12/3 intake remains poor-20-25% at best yesterday -continue trial of megace to boost appetite -recent bmet wnl except for mild CR elevation which has been baseline -12/4 Cr up to 1.8, Bun 32, start  low dose IVF 11m/hr NS -12/5 Cr a little improved to 1.68, continue IVF for now for AKI -12/7 will dc IVF today, Cr down to 1.48   8: Paroxysmal atrial fibrillation: s/p ILR on 06/2021; no AC    9: Sinus node dysfunction s/p [employer] pacemaker 01/05/2022; Dr. GBeckie Salts            -subsequent generator removal 11/27             -  continue Pacerone 200 mg daily              10: Diastolic dysfunction: Daily weight, monitor electrolytes             -continue Lasix 20 mg daily -daily weight trending up, will stop IVF -12/11 Weight overall stable   Filed Weights   02/07/22 0458 02/08/22 0608 02/09/22 0418  Weight: 56 kg 57.2 kg 56.9 kg      11: Diabetes mellitus: CBGs q AC and q HS; carb modified diet             -continue SSI (may be able to dc when on CM diet)             -at home on Tresiba 6-8 units at bedtime, Novolog 3 units with meals   CBG (last 3)  Recent Labs    02/11/22 1659 02/11/22 2109 02/12/22 0603  GLUCAP 219* 162* 126*    12/3  fair control so far but she's still not eating much  12/5 will start 2u novolog with meals  12/8 increase to 3u novolog with meals today  12/11 Stable continue current medications 12: Gastroesophageal reflux disease: continue Protonix   13: Hyperlipidemia: follow-up LFTs/restart Lipitor 20 mg daily at discharge   14: Rheumatoid arthritis: continue prednisone 5 mg daily   -12/4 Voltaren gel for L knee pain started  15: Paroxysmal atrial fibrillation: s/p ILR on 06/2021; no AC              -Zio patch placed today   16: Chronic kidney disease: Cr consistently around 1.2-1.3   17: Hypertension: monitor TID and prn             -continue Norvasc 10 mg daily             -continue Lasix 20 mg daily  -12/4 BP low today, continue to hold norvasc and lasix  12/5 continue to hold norvasc and lasix, BP still a little soft  12/11 Stable, continue current medications      Vitals:   02/11/22 1919 02/12/22 0314  BP: (!) 125/52 (!) 128/52   Pulse: 84 74  Resp: 18 18  Temp: 98.1 F (36.7 C) 98.2 F (36.8 C)  SpO2: 100% 100%   May resume low dose amlodipine  18: Nausea: will schedule Zofran prior to antibiotic administration   19: Periostitis of lumbar spine without osteomyelitis/MSSA bacteremia:             -Cefazolin 2 grams twice daily through 12/6  -Per ID Dr. Tommy Medal 6 weeks of IV ancef given intracardiac device infection, recheck imaging prior to stopping IV abx in case she has psoas abscess  WIll clarify with ID, ? Which  imaging is needed and whether Linezolid will be needed       Latest Ref Rng & Units 02/12/2022    3:49 AM 02/09/2022   10:42 AM 02/08/2022    4:25 AM  CBC  WBC 4.0 - 10.5 K/uL 5.3  5.1  5.0   Hemoglobin 12.0 - 15.0 g/dL 8.1  8.5  7.6   Hematocrit 36.0 - 46.0 % 26.3  26.8  23.8   Platelets 150 - 400 K/uL 217  243  226       20: History of C. difficile colitis: No evidence of recurrent infection             -continue oral vancomycin 125 mg BID while on cefazolin and 1 week thereafter             -  continue Florastor  -12/6 ID supportive continued prophylactic vancomycin- has loose stools but may be abx assosciated   21: Zoster, left posterior hip: continue valacylovir 1000 mg daily   -now just on contact precautions  22. Anemia:  Hgb fluctuating between 8 and 10. Likely d/t chronic disease/CKD  -check Iron panel tomorrow  -12/7 HGB down to 7.6 today, recheck occult blood stool test  -12/11 stable HGB 8.1  23. Constipation  C/o incont loose stool but not recorded as such -02/12/22 LBM today  24. Poor appetite   -Continue megace , family reports appetite is improving   LOS: 10 days A FACE TO FACE EVALUATION WAS PERFORMED  Jennye Boroughs 02/12/2022, 8:08 AM

## 2022-02-13 ENCOUNTER — Telehealth: Payer: Self-pay | Admitting: Cardiovascular Disease

## 2022-02-13 ENCOUNTER — Encounter: Payer: Self-pay | Admitting: Cardiovascular Disease

## 2022-02-13 LAB — GLUCOSE, CAPILLARY
Glucose-Capillary: 144 mg/dL — ABNORMAL HIGH (ref 70–99)
Glucose-Capillary: 306 mg/dL — ABNORMAL HIGH (ref 70–99)
Glucose-Capillary: 92 mg/dL (ref 70–99)
Glucose-Capillary: 140 mg/dL — ABNORMAL HIGH (ref 70–99)

## 2022-02-13 MED ORDER — INSULIN GLARGINE-YFGN 100 UNIT/ML ~~LOC~~ SOLN
4.0000 [IU] | Freq: Every day | SUBCUTANEOUS | Status: DC
  Administered 2022-02-13 – 2022-02-14 (×2): 4 [IU] via SUBCUTANEOUS
  Filled 2022-02-13 (×3): qty 0.04

## 2022-02-13 MED ORDER — INSULIN ASPART 100 UNIT/ML IJ SOLN
2.0000 [IU] | Freq: Three times a day (TID) | INTRAMUSCULAR | Status: DC
  Administered 2022-02-13 – 2022-02-15 (×4): 2 [IU] via SUBCUTANEOUS

## 2022-02-13 NOTE — Telephone Encounter (Signed)
Patient's daughter would like for Dr. Sallyanne Kuster or nurse to call back to see what to do in regards to appointment since patient is in hospital and see PCP right after that appt.

## 2022-02-13 NOTE — Patient Care Conference (Signed)
Inpatient RehabilitationTeam Conference and Plan of Care Update Date: 02/13/2022   Time: 11:05 AM    Patient Name: Shelby Walker      Medical Record Number: [id number]  Date of Birth: 23-Sep-1933 Sex: Female         Room/Bed: 4W02C/4W02C-01 Payor Info: Payor: MEDICARE / Plan: MEDICARE PART A AND B / Product Type: *No Product type* /    Admit Date/Time:  02/02/2022  6:27 PM  Primary Diagnosis:  Severe sepsis with acute organ dysfunction due to Gram positive bacteria [facility])  Hospital Problems: Principal Problem:   Severe sepsis with acute organ dysfunction due to Gram positive bacteria [facility]) Active Problems:   Discitis of lumbar region   Herpes zoster without complication   Clostridium difficile colitis    Expected Discharge Date: Expected Discharge Date: 02/15/22  Team Members Present: Physician leading conference: Dr. Alger Simons Social Worker Present: Loralee Pacas, [place] Nurse Present: Tacy Learn, RN PT Present: Ailene Rud, PT OT Present: Willeen Cass, OT;Roanna Epley, COTA PPS Coordinator present : Gunnar Fusi, SLP     Current Status/Progress Goal Weekly Team Focus  Bowel/Bladder    Continent B/B  Remain continent B/B  Assess every 4 hours and PRN   Swallow/Nutrition/ Hydration               ADL's   CGA for LB self care, S UB self care, close S transfers to toilet and shower   Supervision with ADL   UB HEP, progress standing tolerance    Mobility   supervision mobility overall, limited by pain and fatigue, on track to meet goals   Supervision during mobility  endurance, strength, pain management    Communication                Safety/Cognition/ Behavioral Observations               Pain    3 out of 10 pain to back  3 out 10 on pain scale with PRN medications  Assess every 4 hours, prior to therapy and PRN   Skin     Incision to left chest  Remain free of infection/breakdown   Assess every shift and PRN     Discharge  Planning:  Pt lives alone. Plans for pt to return to her home with support from her family. Fam edu completed on Wed (12/6) 9:30am-12pm with pt dtr [name] and her husband. HHA- [facility] for HHPT/OT/SN (iv abx)/aide; [facility] for IV abx through 03/11/22.   Team Discussion: Severe sepsis with acute organ dysfunction due to Gram positive bacteria. Continent B/B. Pain controlled with PRN medications. Incision to left chest without drainage (pacemake infection). IV fluids stopped. B/P improving. IV ABT continue with home ABT continued. Checking CBGs. Fatigues quickly, cueing for rest breaks. CG/supervision with therapies.  Patient on target to meet rehab goals: yes, on track to meet goals  *See Care Plan and progress notes for long and short-term goals.   Revisions to Treatment Plan:  Monitor labs, medication adjustments, continue ABT Teaching Needs: Medications, safety, gait/transfer training, skin/wound care, etc.   Current Barriers to Discharge: Decreased caregiver support, Home enviroment access/layout, IV antibiotics, Wound care, and Medication compliance  Possible Resolutions to Barriers: Family education completed, nursing education, ABT training, order recommended DME     Medical Summary Current Status: Debility due to sepsis, Diastolic dysfunction, DM, Periostitis L spine, Pacemaker infection, RA  Barriers to Discharge: Cardiac Complications;Infection/IV Antibiotics;Self-care education  Barriers to Discharge Comments: Debility due to  sepsis, Diastolic dysfunction, DM, Periostitis L spine, Pacemaker infection, RA, AKI, HTN Possible Resolutions to [employer] Focus: monitor renal function, continue abx, monitor BP,adjust insulin   Continued Need for Acute Rehabilitation Level of Care: The patient requires daily medical management by a physician with specialized training in physical medicine and rehabilitation for the following reasons: Direction of a multidisciplinary physical  rehabilitation program to maximize functional independence : Yes Medical management of patient stability for increased activity during participation in an intensive rehabilitation regime.: Yes Analysis of laboratory values and/or radiology reports with any subsequent need for medication adjustment and/or medical intervention. : Yes   I attest that I was present, lead the team conference, and concur with the assessment and plan of the team.   Ernest Pine 02/13/2022, 4:37 PM

## 2022-02-13 NOTE — Progress Notes (Signed)
Physical Therapy Session Note  Patient Details  Name: Shelby Walker MRN: [id number] Date of Birth: [birth year]  Today's Date: 02/13/2022 PT Individual Time: 0915-0955 PT Individual Time Calculation (min): 40 min   Short Term Goals: Week 1:  PT Short Term Goal 1 (Week 1): = LTGs due to ELOS PT Short Term Goal 1 - Progress (Week 1): Met Week 2:  PT Short Term Goal 1 (Week 2): =LTGs d/t ELOS  Skilled Therapeutic Interventions/Progress Updates:   Received pt semi-reclined in bed, pt agreeable to PT treatment, and reported pain 4/10 in bilateral knees - declined any pain medication. Session with emphasis on functional mobility/transfers, toileting, generalized strengthening and endurance, dynamic standing balance/coordination, and gait training. Pt transferred semi-reclined<>sitting EOB with HOB elevated and mod I. Pt requested to use restroom and stood with RW and supervision and ambulated in/out of bathroom with RW and supervision with total A for IV pole management. Pt able to manage clothing with distant supervision and able to void and perform peri-care without assist. Pt performed hand hygiene standing at sink with supervision. Pt then stood with RW and supervision and ambulated 170f x 2 trials with RW and supervision to/from therapy gym with total A to manage IV pole and WC. Pt performed x5 sit<>stands without UE support with close supervision and emphasis on quad strength - limited by increased L knee pain. Transitioned to standing alternating marches 2x10 bilaterally with 1.5lb ankle weight, heel raises 2x10, and 1x8 mini-squats (stopped due to improper technique and increased L knee pain). Concluded session with pt sitting EOB, needs within reach, and bed alarm on. Provided pt with heat pack for L knee and IV team present at bedside.   Therapy Documentation Precautions:  Precautions Precautions: Fall Precaution Comments: watch vitals Restrictions Weight Bearing Restrictions:  No   Therapy/Group: Individual Therapy AAlfonse AlpersPT, DPT  02/13/2022, 7:15 AM

## 2022-02-13 NOTE — Discharge Instructions (Addendum)
Inpatient Rehab Discharge Instructions  Shelby Walker Discharge date and time:  02/15/2022  Activities/Precautions/ Functional Status: Activity: no lifting, driving, or strenuous exercise until cleared by MD Diet: diabetic diet Wound Care: keep wound clean and dry; routine catheter care Functional status:  ___ No restrictions     ___ Walk up steps independently ___ 24/7 supervision/assistance   ___ Walk up steps with assistance _x__ Intermittent supervision/assistance  ___ Bathe/dress independently ___ Walk with walker     ___ Bathe/dress with assistance ___ Walk Independently    ___ Shower independently ___ Walk with assistance    __x_ Shower with assistance _x__ No alcohol     ___ Return to work/school ________ Special Instructions: No driving, alcohol consumption or tobacco use.         My questions have been answered and I understand these instructions. I will adhere to these goals and the provided educational materials after my discharge from the hospital.  Patient/Caregiver Signature _______________________________ Date __________  Clinician Signature _______________________________________ Date __________  Please bring this form and your medication list with you to all your follow-up doctor's appointments.

## 2022-02-13 NOTE — Progress Notes (Signed)
Occupational Therapy Session Note  Patient Details  Name: Shelby Walker MRN: [id number] Date of Birth: [birth year]  Today's Date: 02/13/2022 OT Individual Time: 0700-0810 OT Individual Time Calculation (min): 70 min    Short Term Goals: Week 2:  OT Short Term Goal 1 (Week 2): STG=LTG 2/2 ELOS to continue to progress toward  Skilled Therapeutic Interventions/Progress Updates:    Pt seated EOB upon arrival, eating breakfast. OT intervention with focus on BADLs w/c level sit<>stand at sink, functional transfers, safety awareness, and energy conservation. Pt completes bathing/dressing with sit<>stand with supervision/CGA when standing. Ongoing education for energy conservation strategies. Pt fatigues quickly and requires min verbal cues to take rest breaks. Pt pleased with progress. Pt remained in w/c with belt alarm activated. All needs within reach and RN present.   Therapy Documentation Precautions:  Precautions Precautions: Fall Precaution Comments: watch vitals Restrictions Weight Bearing Restrictions: No Pain:  Pt reports Lt hip pain (unrated); repositioned    Therapy/Group: Individual Therapy  Leroy Libman 02/13/2022, 8:12 AM

## 2022-02-13 NOTE — Progress Notes (Signed)
Physical Therapy Session Note  Patient Details  Name: Shelby Walker MRN: [id number] Date of Birth: 01-02-1934  Today's Date: 02/13/2022 PT Individual Time: 1300-1415 PT Individual Time Calculation (min): 75 min   Short Term Goals: Week 2:  PT Short Term Goal 1 (Week 2): =LTGs d/t ELOS  Skilled Therapeutic Interventions/Progress Updates:    Pt seated EOB on arrival, reporting 5/10 pain with mobility, premedicated. Rest and positioning provided as needed. Sit to stand with supervision to Rw throughout session. Pt performed ambulatory transfer to bathroom with supervision, supervision for 3/3 toileting tasks. Continent bladder void. Hand hygiene in standing at sink with supervision. Pt then ambulated 4 x 120-200 ft to/from gym with supervision and RW. Pt states her RW will not fit in bathroom at home, so set up at hi low table at counter height to mimic home set up. Pt performed side stepping and forward/reverse walking 3 x 4-5 laps of 5 ft table. CGA overall with slight increase in support for backward walking d/t increased balance challenge.  After seated rest break, pt participated in corn hole with another patient for increased participation, social interaction, standing tolerance, and balance. Pt performed x 3 rounds on airex pad with CGA and x 2 rounds with no airex pad and supervision. Pt self selects staggered stance. Pt then ambulated back to room and returned to bed with supervision. Pt was left with all needs in reach and alarm active.   Therapy Documentation Precautions:  Precautions Precautions: Fall Precaution Comments: watch vitals Restrictions Weight Bearing Restrictions: No General:       Therapy/Group: Individual Therapy  Mickel Fuchs 02/13/2022, 1:33 PM

## 2022-02-13 NOTE — Progress Notes (Signed)
Patient ID: Shelby Walker, female   DOB: [birth year], 88 y.o.   MRN: [id number]  SW received phone call from pt dtr [name] who was inquiring about when she will have more follow-up about education about IV abx. SW explained will follow-up with [facility] to request follow-up. SW also explained d/c process with regard to IV abx.   SW relayed pt dtr concerns to [facility] and asked her to follow-up.   Loralee Pacas, MSW, [place] Office: [phone] Cell: [phone] Fax: [phone]

## 2022-02-13 NOTE — Progress Notes (Signed)
PROGRESS NOTE   Subjective/Complaints: Reports she feels well overall today. No new complaints.  Review of Systems  Constitutional:  Negative for chills, fever and malaise/fatigue.  HENT:  Negative for congestion.   Eyes:  Negative for double vision.  Respiratory:  Negative for shortness of breath.   Cardiovascular:  Negative for chest pain.  Gastrointestinal:  Negative for abdominal pain, diarrhea, heartburn, nausea and vomiting.  Genitourinary: Negative.   Musculoskeletal:  Positive for back pain and joint pain.  Skin:  Negative for rash.  Neurological:  Positive for weakness. Negative for headaches.      Objective:   No results found. Recent Labs    02/12/22 0349  WBC 5.3  HGB 8.1*  HCT 26.3*  PLT 217    Recent Labs    02/11/22 2354  NA 140  K 4.8  CL 109  CO2 22  GLUCOSE 152*  BUN 26*  CREATININE 1.62*  CALCIUM 8.2*     Intake/Output Summary (Last 24 hours) at 02/13/2022 0816 Last data filed at 02/13/2022 0658 Gross per 24 hour  Intake 897 ml  Output --  Net 897 ml         Physical Exam: Vital Signs Blood pressure (!) 119/47, pulse 72, temperature 98.6 F (37 C), temperature source Oral, resp. rate 16, weight 58.3 kg, SpO2 99 %.   General: No acute distress, seen at bedside Mood and affect are appropriate Heart: Regular rate and rhythm no rubs murmurs or extra sounds Lungs: Clear to auscultation, breathing unlabored, no rales or wheezes Abdomen: Positive bowel sounds, soft nontender to palpation, nondistended Extremities: No clubbing, cyanosis, or edema Psych: very pleasant Neuro:  Alert and oriented x 3. Follows commands. Normal insight and awareness. Intact Memory. Normal language and speech. CN 2-12 intact, follows commands. Motor 4/5 UE, 3->4/5 LE prox to distal. No focal sensory findings.  Musculoskeletal: TMA well shaped. Normal ROM, fair sitting posture . Mild L knee joint line  tenderness   Assessment/Plan: 1. Functional deficits which require 3+ hours per day of interdisciplinary therapy in a comprehensive inpatient rehab setting. Physiatrist is providing close team supervision and 24 hour management of active medical problems listed below. Physiatrist and rehab team continue to assess barriers to discharge/monitor patient progress toward functional and medical goals  Care Tool:  Bathing    Body parts bathed by patient: Right arm, Left arm, Chest, Abdomen, Front perineal area, Right upper leg, Buttocks, Left upper leg, Right lower leg, Left lower leg, Face         Bathing assist Assist Level: Contact Guard/Touching assist     Upper Body Dressing/Undressing Upper body dressing   What is the patient wearing?: Pull over shirt    Upper body assist Assist Level: Supervision/Verbal cueing    Lower Body Dressing/Undressing Lower body dressing      What is the patient wearing?: Underwear/pull up, Pants     Lower body assist Assist for lower body dressing: Contact Guard/Touching assist     Toileting Toileting    Toileting assist Assist for toileting: Contact Guard/Touching assist     Transfers Chair/bed transfer  Transfers assist     Chair/bed transfer assist level: Minimal  Assistance - Patient > 75%     Locomotion Ambulation   Ambulation assist      Assist level: Contact Guard/Touching assist Assistive device: Walker-rolling Max distance: 125   Walk 10 feet activity   Assist     Assist level: Contact Guard/Touching assist Assistive device: Walker-rolling   Walk 50 feet activity   Assist    Assist level: Contact Guard/Touching assist Assistive device: Walker-rolling    Walk 150 feet activity   Assist Walk 150 feet activity did not occur: Safety/medical concerns (dizziness)         Walk 10 feet on uneven surface  activity   Assist Walk 10 feet on uneven surfaces activity did not occur: Safety/medical  concerns (dizziness)         Wheelchair     Assist Is the patient using a wheelchair?: Yes Type of Wheelchair: Manual    Wheelchair assist level: Minimal Assistance - Patient > 75% Max wheelchair distance: 30    Wheelchair 50 feet with 2 turns activity    Assist    Wheelchair 50 feet with 2 turns activity did not occur:  (bil UE fatigue; pt propelled 30')   Assist Level: Moderate Assistance - Patient 50 - 74%   Wheelchair 150 feet activity     Assist  Wheelchair 150 feet activity did not occur:  (bil UE fatigue; pt propelled 30')   Assist Level: Total Assistance - Patient < 25%   Blood pressure (!) 119/47, pulse 72, temperature 98.6 F (37 C), temperature source Oral, resp. rate 16, weight 58.3 kg, SpO2 99 %.  Medical Problem List and Plan: 1. Functional deficits secondary to debility secondary to sepsis.              -patient may shower             -ELOS/Goals: 12/14, sup            -Continue CIR therapies including PT, OT     2.  Antithrombotics: -DVT/anticoagulation:  Pharmaceutical: Lovenox>> start 30 mg daily             -antiplatelet therapy: None 3. Pain Management: Scheduled Tylenol '650mg'$  for back pain.  Continue Lidoderm patches.  Aquathermia pad. 4. Mood/Behavior/Sleep: LCSW to evaluate and provide emotional support             -antipsychotic agents: n/a 5. Neuropsych/cognition: This patient is capable of making decisions on her own behalf. 6. Skin/Wound Care: Routine skin care checks 7. Fluids/Electrolytes/Nutrition: Strict I's and O's and follow-up chemistries             -Ensure with meals             -change to carb modified diet   12/3 intake remains poor-20-25% at best yesterday -continue trial of megace to boost appetite -recent bmet wnl except for mild CR elevation which has been baseline -12/4 Cr up to 1.8, Bun 32, start low dose IVF 56m/hr NS -12/5 Cr a little improved to 1.68, continue IVF for now for AKI -12/7 will dc IVF  today, Cr down to 1.48   8: Paroxysmal atrial fibrillation: s/p ILR on 06/2021; no AC    9: Sinus node dysfunction s/p [employer]onic pacemaker 01/05/2022; Dr. GBeckie Salts            -subsequent generator removal 11/27             -continue Pacerone 200 mg daily  10: Diastolic dysfunction: Daily weight, monitor electrolytes             -continue Lasix 20 mg daily  -Lasix has been held -Weight has been trending up, consider restart lasix tomorrow   Filed Weights   02/08/22 0608 02/09/22 0418 02/13/22 0500  Weight: 57.2 kg 56.9 kg 58.3 kg      11: Diabetes mellitus: CBGs q AC and q HS; carb modified diet             -continue SSI (may be able to dc when on CM diet)             -at home on Tresiba 6-8 units at bedtime, Novolog 3 units with meals   CBG (last 3)  Recent Labs    02/12/22 2038 02/13/22 0637 02/13/22 1140  GLUCAP 100* 144* 306*    12/3  fair control so far but she's still not eating much  12/12 start semglee 4 units HS, will do 2 units novolog with meals  12: Gastroesophageal reflux disease: continue Protonix   13: Hyperlipidemia: follow-up LFTs/restart Lipitor 20 mg daily at discharge   14: Rheumatoid arthritis: continue prednisone 5 mg daily   -12/4 Voltaren gel for L knee pain started  15: Paroxysmal atrial fibrillation: s/p ILR on 06/2021; no AC              -Zio patch    16: Chronic kidney disease:   -Recheck tomorrow   17: Hypertension: monitor TID and prn             -continue Norvasc 10 mg daily             -continue Lasix 20 mg daily  -12/4 BP low today, continue to hold norvasc and lasix  12/5 continue to hold norvasc and lasix, BP still a little soft  12/12 soft but stable      Vitals:   02/12/22 1929 02/13/22 0414  BP: 91/66 (!) 119/47  Pulse: 71 72  Resp: 18 16  Temp: 98.8 F (37.1 C) 98.6 F (37 C)  SpO2: 100% 99%    18: Nausea: will schedule Zofran prior to antibiotic administration   19: Periostitis of lumbar spine without  osteomyelitis/MSSA bacteremia:             -Cefazolin 2 grams twice daily through 12/6  -Per ID Dr. Tommy Medal 6 weeks of IV ancef given intracardiac device infection, recheck imaging prior to stopping IV abx in case she has psoas abscess  WIll clarify with ID, ? Which  imaging is needed and whether Linezolid will be needed       Latest Ref Rng & Units 02/12/2022    3:49 AM 02/09/2022   10:42 AM 02/08/2022    4:25 AM  CBC  WBC 4.0 - 10.5 K/uL 5.3  5.1  5.0   Hemoglobin 12.0 - 15.0 g/dL 8.1  8.5  7.6   Hematocrit 36.0 - 46.0 % 26.3  26.8  23.8   Platelets 150 - 400 K/uL 217  243  226     20: History of C. difficile colitis: No evidence of recurrent infection             -continue oral vancomycin 125 mg BID while on cefazolin and 1 week thereafter             -continue Florastor  -12/6 ID supportive continued prophylactic vancomycin- has loose stools but may be abx assosciated   21: Zoster, left posterior hip:  continue valacylovir 1000 mg daily   -now just on contact precautions  22. Anemia:  Hgb fluctuating between 8 and 10. Likely d/t chronic disease/CKD  -check Iron panel tomorrow  -12/7 HGB down to 7.6 today, recheck occult blood stool test  -12/11 stable HGB 8.1  23. Constipation  C/o incont loose stool but not recorded as such -02/12/22 LBM today  24. Poor appetite   -Continue megace , family reports appetite is improving   LOS: 11 days A FACE TO FACE EVALUATION WAS PERFORMED  Jennye Boroughs 02/13/2022, 8:16 AM

## 2022-02-14 LAB — BASIC METABOLIC PANEL
Anion gap: 7 (ref 5–15)
BUN: 20 mg/dL (ref 8–23)
CO2: 22 mmol/L (ref 22–32)
Calcium: 8.3 mg/dL — ABNORMAL LOW (ref 8.9–10.3)
Chloride: 111 mmol/L (ref 98–111)
Creatinine, Ser: 1.17 mg/dL — ABNORMAL HIGH (ref 0.44–1.00)
GFR, Estimated: 45 mL/min — ABNORMAL LOW (ref >60)
Glucose, Bld: 120 mg/dL — ABNORMAL HIGH (ref 70–99)
Potassium: 4.2 mmol/L (ref 3.5–5.1)
Sodium: 140 mmol/L (ref 135–145)

## 2022-02-14 LAB — GLUCOSE, CAPILLARY
Glucose-Capillary: 108 mg/dL — ABNORMAL HIGH (ref 70–99)
Glucose-Capillary: 229 mg/dL — ABNORMAL HIGH (ref 70–99)
Glucose-Capillary: 144 mg/dL — ABNORMAL HIGH (ref 70–99)
Glucose-Capillary: 180 mg/dL — ABNORMAL HIGH (ref 70–99)

## 2022-02-14 NOTE — Progress Notes (Signed)
Occupational Therapy Session Note  Patient Details  Name: Shelby Walker MRN: [id number] Date of Birth: [birth year]  Today's Date: 02/14/2022 OT Individual Time: 1400-1430 OT Individual Time Calculation (min): 30 min    Short Term Goals: Week 2:  OT Short Term Goal 1 (Week 2): STG=LTG 2/2 ELOS to continue to progress toward  Skilled Therapeutic Interventions/Progress Updates:    Pt resting in bed upon arrival with family present. OT intervention with focus on functional amb with RW and BUE AROM. Pt amb from room to day room before resting in w/c. BUE AROM with small ball. Pt reports weakness in Lt shoulder but able to complete 3 sets of chest presses x8. Pt amb with RW back to room and sat EOB. Sit>supine with supervision. Pt remained in bed with family present. All needs within reach. Ongoing education with family re covering PICC when showering.   Therapy Documentation Precautions:  Precautions Precautions: Fall Precaution Comments: watch vitals Restrictions Weight Bearing Restrictions: No Pain:  Pt c/o Lt knee and hip pain; kpad and rest    Therapy/Group: Individual Therapy  Leroy Libman 02/14/2022, 2:37 PM

## 2022-02-14 NOTE — Progress Notes (Signed)
Physical Therapy Discharge Summary  Patient Details  Name: Shelby Walker MRN: [id number] Date of Birth: 15-Oct-1933  Date of Discharge from PT service:February 14, 2022  Today's Date: 02/14/2022 PT Individual Time: 1105-1200 PT Individual Time Calculation (min): 55 min    Patient has met 11 of 12 long term goals due to improved activity tolerance, improved balance, improved postural control, increased strength, and improved coordination.  Patient to discharge at an ambulatory level Supervision.   Patient's care partner is independent to provide the necessary physical assistance at discharge.  Reasons goals not met: Pt limited in w/c mobility due to increased pain in B shoulders with activity. Pt is able to ambulate 150-228f for improved endurance.   Recommendation:  Patient will benefit from ongoing skilled PT services in {setting:3041680} to continue to advance safe functional mobility, address ongoing impairments in ***, and minimize fall risk.  Equipment: No equipment provided  Reasons for discharge: treatment goals met and discharge from hospital  Patient/family agrees with progress made and goals achieved: Yes  PT Discharge Precautions/Restrictions Restrictions Weight Bearing Restrictions: No Vital Signs  Pain   Pain Interference Pain Interference Pain Effect on Sleep: 2. Occasionally Pain Interference with Therapy Activities: 2. Occasionally Pain Interference with Day-to-Day Activities: 1. Rarely or not at all Vision/Perception  Vision - History Ability to See in Adequate Light: 1 Impaired Perception Perception: Within Functional Limits Praxis Praxis: Intact  Cognition Overall Cognitive Status: History of cognitive impairments - at baseline Arousal/Alertness: Awake/alert Orientation Level: Oriented X4 Year: 2023 Month: December Day of Week: Correct Attention: Sustained Focused Attention: Appears intact Sustained Attention: Appears intact Memory:  Impaired Memory Impairment: Decreased short term memory Awareness: Appears intact Problem Solving: Appears intact Safety/Judgment: Appears intact Sensation Sensation Light Touch: Appears Intact Hot/Cold: Appears Intact Proprioception: Appears Intact Stereognosis: Not tested Coordination Gross Motor Movements are Fluid and Coordinated: Yes Fine Motor Movements are Fluid and Coordinated: Yes Coordination and Movement Description: pt with RA Finger Nose Finger Test: [facility]  Motor Motor: Other (comment) Motor - Skilled Clinical Observations: generalized weakness  Mobility Bed Mobility Bed Mobility: Supine to Sit;Sit to Supine Supine to Sit: Independent with assistive device Sit to Supine: Independent with assistive device Transfers Transfers: Sit to Stand;Stand to Sit Sit to Stand: Independent with assistive device Stand to Sit: Independent with assistive device Transfer (Assistive device): Rolling walker Locomotion  Gait Ambulation: Yes Gait Assistance: Supervision/Verbal cueing Gait Distance (Feet): 200 Feet Assistive device: Rolling walker Gait Gait: Yes Gait Pattern: Narrow base of support;Decreased stride length Gait velocity: decreased Stairs / Additional Locomotion Stairs: Yes Stairs Assistance: Supervision/Verbal cueing Stair Management Technique: One rail Left Number of Stairs: 4 Height of Stairs: 6 Pick up small object from the floor assist level: Supervision/Verbal cueing  Trunk/Postural Assessment  Cervical Assessment Cervical Assessment: Exceptions to [facility](forward head) Thoracic Assessment Thoracic Assessment: Exceptions to [facility](rounded shoulders) Lumbar Assessment Lumbar Assessment: Exceptions to [facility](posterior pelvic tilt)  Balance Balance Balance Assessed: Yes Dynamic Sitting Balance Dynamic Sitting - Balance Support: Feet supported;During functional activity Dynamic Sitting - Level of Assistance: 5: Stand by assistance Dynamic Sitting -  Balance Activities: Forward lean/weight shifting;Reaching for oConsulting civil engineerStanding - Balance Support: Bilateral upper extremity supported Static Standing - Level of Assistance: 6: Modified independent (Device/Increase time) Dynamic Standing Balance Dynamic Standing - Balance Support: Bilateral upper extremity supported Dynamic Standing - Level of Assistance: 5: Stand by assistance Dynamic Standing - Balance Activities: Reaching for objects Extremity Assessment  RUE Assessment  RUE Assessment: Exceptions to [facility] Active Range of Motion (AROM) Comments: ~120 degrees shoulder flex General Strength Comments: 3+/5, RA LUE Assessment LUE Assessment: Exceptions to [facility] Active Range of Motion (AROM) Comments: ~120 degrees shoulder flex General Strength Comments: 3+/5, has RA RLE Assessment RLE Assessment: Within Functional Limits LLE Assessment LLE Assessment: Within Functional Limits   Rosita DeChalus 02/14/2022, 11:47 AM

## 2022-02-14 NOTE — Progress Notes (Signed)
Physical Therapy Session Note  Patient Details  Name: Shelby Walker MRN: [id number] Date of Birth: 05/01/1933  Today's Date: 02/14/2022 PT Individual Time: 1105-1200 PT Individual Time Calculation (min): 55 min   Short Term Goals: Week 2:  PT Short Term Goal 1 (Week 2): =LTGs d/t ELOS  Skilled Therapeutic Interventions/Progress Updates: Pt presented at sink with NT present agreeable to therapy. Pt c/o mild pain in L knee but did nor rate with rest breaks provided as needed during session. Session focused on functional mobility in preparation for d/c. Pt transported to rehab gym for time management. Performed car transfer with supervision and use of RW. Pt then ambulated to rehab gym with RW and supervision with intermittent cues for pacing ~225f. Participated in ascending/descending x 4 steps with supervision and L rail only performing with step to pattern both ascending and descending. Pt also participated in use of rebounder for dynamic balance performing with SBA and was able to pick ball off floor with SBA. Pt did require intermittent rest breaks due to fatigue. Pt then returned to room and performed ambulatory transfer to bed where pt performed bed mobility independently. Pt returned to w/c at end of session and remained with call bell within reach and needs met.      Therapy Documentation Precautions:  Precautions Precautions: Fall Precaution Comments: watch vitals Restrictions Weight Bearing Restrictions: No General:   Vital Signs:  Pain:   Mobility: Bed Mobility Bed Mobility: Supine to Sit;Sit to Supine Supine to Sit: Independent with assistive device Sit to Supine: Independent with assistive device Transfers Transfers: Sit to Stand;Stand to Sit Sit to Stand: Independent with assistive device Stand to Sit: Independent with assistive device Transfer (Assistive device): Rolling walker Locomotion : Gait Ambulation: Yes Gait Assistance: Supervision/Verbal cueing Gait  Distance (Feet): 200 Feet Assistive device: Rolling walker Gait Gait: Yes Gait Pattern: Narrow base of support;Decreased stride length Gait velocity: decreased Stairs / Additional Locomotion Stairs: Yes Stairs Assistance: Supervision/Verbal cueing Stair Management Technique: One rail Left Number of Stairs: 4 Height of Stairs: 6  Trunk/Postural Assessment : Cervical Assessment Cervical Assessment: Exceptions to [facility](forward head) Thoracic Assessment Thoracic Assessment: Exceptions to [facility](rounded shoulders) Lumbar Assessment Lumbar Assessment: Exceptions to [facility](posterior pelvic tilt)  Balance: Balance Balance Assessed: Yes Dynamic Sitting Balance Dynamic Sitting - Balance Support: Feet supported;During functional activity Dynamic Sitting - Level of Assistance: 5: Stand by assistance Dynamic Sitting - Balance Activities: Forward lean/weight shifting;Reaching for oConsulting civil engineerStanding - Balance Support: Bilateral upper extremity supported Static Standing - Level of Assistance: 6: Modified independent (Device/Increase time) Dynamic Standing Balance Dynamic Standing - Balance Support: Bilateral upper extremity supported Dynamic Standing - Level of Assistance: 5: Stand by assistance Dynamic Standing - Balance Activities: Reaching for objects Exercises:   Other Treatments:      Therapy/Group: Individual Therapy  Polo Mcmartin 02/14/2022, 12:25 PM

## 2022-02-14 NOTE — Progress Notes (Addendum)
PROGRESS NOTE   Subjective/Complaints: Reports she feels well overall today. No new complaints.  Review of Systems  Constitutional:  Negative for chills and fever.  HENT:  Negative for congestion.   Eyes:  Negative for double vision.  Respiratory:  Negative for shortness of breath.   Cardiovascular:  Negative for chest pain.  Gastrointestinal:  Negative for abdominal pain, nausea and vomiting.  Genitourinary: Negative.   Musculoskeletal:  Positive for back pain and joint pain.  Skin:  Negative for rash.  Neurological:  Positive for weakness. Negative for headaches.  Psychiatric/Behavioral:  Negative for depression.       Objective:   No results found. Recent Labs    02/12/22 0349  WBC 5.3  HGB 8.1*  HCT 26.3*  PLT 217    Recent Labs    02/11/22 2354 02/14/22 0413  NA 140 140  K 4.8 4.2  CL 109 111  CO2 22 22  GLUCOSE 152* 120*  BUN 26* 20  CREATININE 1.62* 1.17*  CALCIUM 8.2* 8.3*     Intake/Output Summary (Last 24 hours) at 02/14/2022 0825 Last data filed at 02/13/2022 1830 Gross per 24 hour  Intake 360 ml  Output --  Net 360 ml         Physical Exam: Vital Signs Blood pressure (!) 127/57, pulse 74, temperature 98.9 F (37.2 C), temperature source Oral, resp. rate 16, weight 57.3 kg, SpO2 99 %.   General: No acute distress, sitting in chair Mood and affect are appropriate Heart: Regular rate and rhythm no rubs murmurs or extra sounds Lungs: Clear to auscultation, breathing unlabored, no rales or wheezes, non-labored braething Abdomen: Positive bowel sounds, soft nontender to palpation, nondistended Extremities: No clubbing, cyanosis, or edema Psych: very pleasant Neuro:  Alert and oriented x 3. Follows commands. Normal language and speech. CN 2-12 intact, follows commands. Motor 4/5 UE, 3->4/5 LE prox to distal. No focal sensory findings.  Musculoskeletal: TMA well shaped. Normal ROM,  fair sitting posture . Mild L knee joint line tenderness   Assessment/Plan: 1. Functional deficits which require 3+ hours per day of interdisciplinary therapy in a comprehensive inpatient rehab setting. Physiatrist is providing close team supervision and 24 hour management of active medical problems listed below. Physiatrist and rehab team continue to assess barriers to discharge/monitor patient progress toward functional and medical goals  Care Tool:  Bathing    Body parts bathed by patient: Right arm, Left arm, Chest, Abdomen, Front perineal area, Right upper leg, Buttocks, Left upper leg, Right lower leg, Left lower leg, Face         Bathing assist Assist Level: Contact Guard/Touching assist     Upper Body Dressing/Undressing Upper body dressing   What is the patient wearing?: Pull over shirt    Upper body assist Assist Level: Supervision/Verbal cueing    Lower Body Dressing/Undressing Lower body dressing      What is the patient wearing?: Underwear/pull up, Pants     Lower body assist Assist for lower body dressing: Contact Guard/Touching assist     Toileting Toileting    Toileting assist Assist for toileting: Contact Guard/Touching assist     Transfers Chair/bed transfer  Transfers  assist     Chair/bed transfer assist level: Supervision/Verbal cueing     Locomotion Ambulation   Ambulation assist      Assist level: Supervision/Verbal cueing Assistive device: Walker-rolling Max distance: 174f   Walk 10 feet activity   Assist     Assist level: Supervision/Verbal cueing Assistive device: Walker-rolling   Walk 50 feet activity   Assist    Assist level: Supervision/Verbal cueing Assistive device: Walker-rolling    Walk 150 feet activity   Assist Walk 150 feet activity did not occur: Safety/medical concerns (dizziness)  Assist level: Supervision/Verbal cueing Assistive device: Walker-rolling    Walk 10 feet on uneven surface   activity   Assist Walk 10 feet on uneven surfaces activity did not occur: Safety/medical concerns (dizziness)         Wheelchair     Assist Is the patient using a wheelchair?: Yes Type of Wheelchair: Manual    Wheelchair assist level: Minimal Assistance - Patient > 75% Max wheelchair distance: 30    Wheelchair 50 feet with 2 turns activity    Assist    Wheelchair 50 feet with 2 turns activity did not occur:  (bil UE fatigue; pt propelled 30')   Assist Level: Moderate Assistance - Patient 50 - 74%   Wheelchair 150 feet activity     Assist  Wheelchair 150 feet activity did not occur:  (bil UE fatigue; pt propelled 30')   Assist Level: Total Assistance - Patient < 25%   Blood pressure (!) 127/57, pulse 74, temperature 98.9 F (37.2 C), temperature source Oral, resp. rate 16, weight 57.3 kg, SpO2 99 %.  Medical Problem List and Plan: 1. Functional deficits secondary to debility secondary to sepsis.              -patient may shower             -ELOS/Goals: 12/14, sup            -Continue CIR therapies including PT, OT   -DC home tomorrow    2.  Antithrombotics: -DVT/anticoagulation:  Pharmaceutical: Lovenox>> start 30 mg daily             -antiplatelet therapy: None 3. Pain Management: Scheduled Tylenol '650mg'$  for back pain.  Continue Lidoderm patches.  Aquathermia pad. 4. Mood/Behavior/Sleep: LCSW to evaluate and provide emotional support             -antipsychotic agents: n/a 5. Neuropsych/cognition: This patient is capable of making decisions on her own behalf. 6. Skin/Wound Care: Routine skin care checks 7. Fluids/Electrolytes/Nutrition: Strict I's and O's and follow-up chemistries             -Ensure with meals             -change to carb modified diet   12/3 intake remains poor-20-25% at best yesterday -continue trial of megace to boost appetite -recent bmet wnl except for mild CR elevation which has been baseline -12/4 Cr up to 1.8, Bun 32,  start low dose IVF 540mhr NS -12/5 Cr a little improved to 1.68, continue IVF for now for AKI -12/7 will dc IVF today, Cr down to 1.48 -12/13 Cr improved today to 1.17   8: Paroxysmal atrial fibrillation: s/p ILR on 06/2021; no AC    9: Sinus node dysfunction s/p [employer] pacemaker 01/05/2022; Dr. G Beckie Salts           -subsequent generator removal 11/27             -  continue Pacerone 200 mg daily              10: Diastolic dysfunction: Daily weight, monitor electrolytes             -continue Lasix 20 mg daily  -Lasix has been held -Weight stable, does not appear to have fluid overload   Filed Weights   02/09/22 0418 02/13/22 0500 02/14/22 0740  Weight: 56.9 kg 58.3 kg 57.3 kg      11: Diabetes mellitus: CBGs q AC and q HS; carb modified diet             -continue SSI (may be able to dc when on CM diet)             -at home on Tresiba 6-8 units at bedtime, Novolog 3 units with meals   CBG (last 3)  Recent Labs    02/13/22 1709 02/13/22 2123 02/14/22 0610  GLUCAP 92 140* 108*     12/3  fair control so far but she's still not eating much  12/12 start semglee 4 units HS, will do 2 units novolog with meals  12/13 well controlled, continue monitor response  12: Gastroesophageal reflux disease: continue Protonix   13: Hyperlipidemia: follow-up LFTs/restart Lipitor 20 mg daily at discharge   14: Rheumatoid arthritis: continue prednisone 5 mg daily   -12/4 Voltaren gel for L knee pain started  15: Paroxysmal atrial fibrillation: s/p ILR on 06/2021; no AC              -Zio patch    16: Chronic kidney disease:   -12/13 Cr improved to 1.17, BN 20   17: Hypertension: monitor TID and prn             -continue Norvasc 10 mg daily             -continue Lasix 20 mg daily  -12/4 BP low today, continue to hold norvasc and lasix  12/5 continue to hold norvasc and lasix, BP still a little soft  12/13 well controlled, monitor      Vitals:   02/13/22 1930 02/14/22 0409  BP: (!)  115/98 (!) 127/57  Pulse: 82 74  Resp: 15 16  Temp: 99.1 F (37.3 C) 98.9 F (37.2 C)  SpO2: 100% 99%    18: Nausea: will schedule Zofran prior to antibiotic administration   19: 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 Tommy Medal 6 weeks of IV ancef given intracardiac device infection, recheck imaging prior to stopping IV abx in case she has psoas abscess  -Continue f/u with ID as outpatient      Latest Ref Rng & Units 02/12/2022    3:49 AM 02/09/2022   10:42 AM 02/08/2022    4:25 AM  CBC  WBC 4.0 - 10.5 K/uL 5.3  5.1  5.0   Hemoglobin 12.0 - 15.0 g/dL 8.1  8.5  7.6   Hematocrit 36.0 - 46.0 % 26.3  26.8  23.8   Platelets 150 - 400 K/uL 217  243  226     20: History of C. difficile colitis: No evidence of recurrent infection             -continue oral vancomycin 125 mg BID while on cefazolin and 1 week thereafter             -continue Florastor  -  12/6 ID supportive continued prophylactic vancomycin- has loose stools but may be abx assosciated   21: Zoster, left posterior hip: continue valacylovir 1000 mg daily   -now just on contact precautions  22. Anemia:  Hgb fluctuating between 8 and 10. Likely d/t chronic disease/CKD  -check Iron panel tomorrow  -12/7 HGB down to 7.6 today, recheck occult blood stool test  -12/11 stable HGB 8.1  23. Constipation  C/o incont loose stool but not recorded as such -02/12/22 LBM today  24. Poor appetite   -Continue megace , family reports appetite is improving   LOS: 12 days A FACE TO FACE EVALUATION WAS PERFORMED  Shelby Walker 02/14/2022, 8:25 AM

## 2022-02-14 NOTE — Progress Notes (Signed)
Occupational Therapy Session Note  Patient Details  Name: Shelby Walker MRN: [id number] Date of Birth: 03-31-1933  Today's Date: 02/14/2022 OT Individual Time: 0375-4360 OT Individual Time Calculation (min): 45 min    Short Term Goals: Week 2:  OT Short Term Goal 1 (Week 2): STG=LTG 2/2 ELOS to continue to progress toward  Skilled Therapeutic Interventions/Progress Updates:    Pt resting in bed upon arrival. Pt reports she didn't rest well during night. Pt missed 30 mins of therapy at beginning of session to allow time for pt to eat breakfast while it was hot. Pt requested to use bathroom and amb with RW to bathroom with supervision. Toileting tasks with supervision. Pt completed all grooming tasks while standing at sink. Pt amb with RW in room to complete simple homemaking tasks with supervision. Ongoing education re energy conservation and home safety. Pt remained in w/c with all needs within reach and belt alarm activated.   Therapy Documentation Precautions:  Precautions Precautions: Fall Precaution Comments: watch vitals Restrictions Weight Bearing Restrictions: No General: General OT Amount of Missed Time: 30 Minutes Pain: Pain Assessment Pain Scale: 0-10 Pain Score: 4  Lt knee and hip Pain Intervention(s): meds admin at beginning of session   Therapy/Group: Individual Therapy  Leroy Libman 02/14/2022, 9:33 AM

## 2022-02-14 NOTE — Plan of Care (Signed)
  Problem: RH BOWEL ELIMINATION Goal: RH STG MANAGE BOWEL WITH ASSISTANCE Description: STG Manage Bowel with min Assistance. Outcome: Progressing   Problem: RH BLADDER ELIMINATION Goal: RH STG MANAGE BLADDER WITH ASSISTANCE Description: STG Manage Bladder With min Assistance Outcome: Progressing   Problem: RH SKIN INTEGRITY Goal: RH STG SKIN FREE OF INFECTION/BREAKDOWN Description: Skin will not have any additional infection/breakdown with min assist Outcome: Progressing

## 2022-02-14 NOTE — Progress Notes (Signed)
Occupational Therapy Discharge Summary  Patient Details  Name: Shelby Walker MRN: [id number] Date of Birth: 1933/08/02  Date of Discharge from [place] service:February 14, 2022   Patient has met 15 of 15 long term goals due to improved activity tolerance, improved balance, postural control, ability to compensate for deficits, functional use of  RIGHT upper, RIGHT lower, LEFT upper, and LEFT lower extremity, improved attention, improved awareness, and improved coordination.  Pt made steady progress with BADLs and IADLs during this admission. Pt is supervision for all BADLs and IADLs using AE PRN. Pt fatigues quickly and takes rest breaks appropriately. Pt educated on energy conservations strategies and home safety. Pt's family has participated in therapy session. Patient to discharge at overall Supervision level.  Patient's care partner is independent to provide the necessary physical and cognitive assistance at discharge.    Reasons goals not met: n/a  Recommendation:  Patient will benefit from ongoing skilled OT services in home health setting to continue to advance functional skills in the area of BADL, iADL, and Reduce care partner burden.  Equipment: No equipment provided  Reasons for discharge: treatment goals met  Patient/family agrees with progress made and goals achieved: Yes  OT Discharge ADL ADL Equipment Provided: Long-handled shoe horn Eating: Independent Where Assessed-Eating: Wheelchair Grooming: Independent Where Assessed-Grooming: Wheelchair, Sitting at sink Upper Body Bathing: Supervision/safety Where Assessed-Upper Body Bathing: Shower Lower Body Bathing: Supervision/safety Where Assessed-Lower Body Bathing: Shower Upper Body Dressing: Independent Where Assessed-Upper Body Dressing: Sitting at sink Lower Body Dressing: Supervision/safety Where Assessed-Lower Body Dressing: Sitting at sink Toileting: Supervision/safety Where Assessed-Toileting: Contractor: Distant supervision Armed forces technical officer Method: Counselling psychologist: Energy manager: Unable to English as a second language teacher: Close supervision Social research officer, government Method: Heritage manager: Gaffer Baseline Vision/History: 1 Wears glasses Vision Assessment?: No apparent visual deficits Perception  Perception: Within Functional Limits Praxis Praxis: Intact Cognition Cognition Overall Cognitive Status: History of cognitive impairments - at baseline Arousal/Alertness: Awake/alert Orientation Level: Person;Place;Situation Person: Oriented Place: Oriented Situation: Oriented Memory: Impaired Memory Impairment: Decreased short term memory Attention: Sustained Focused Attention: Appears intact Sustained Attention: Appears intact Awareness: Appears intact Problem Solving: Appears intact Safety/Judgment: Appears intact Brief Interview for Mental Status (BIMS) Repetition of Three Words (First Attempt): 3 Temporal Orientation: Year: Correct Temporal Orientation: Month: Accurate within 5 days Temporal Orientation: Day: Correct Recall: "Sock": No, could not recall Recall: "Blue": Yes, no cue required Recall: "Bed": Yes, after cueing ("a piece of furniture") BIMS Summary Score: 12 Sensation Sensation Light Touch: Appears Intact Hot/Cold: Appears Intact Proprioception: Appears Intact Stereognosis: Not tested Coordination Gross Motor Movements are Fluid and Coordinated: Yes Fine Motor Movements are Fluid and Coordinated: Yes Coordination and Movement Description: pt with RA Finger Nose Finger Test: [facility] Motor  Motor Motor: Other (comment) Motor - Skilled Clinical Observations: generalized weakness Mobility     Trunk/Postural Assessment  Cervical Assessment Cervical Assessment: Exceptions to [facility] (forward head) Thoracic Assessment Thoracic Assessment: Exceptions to [facility] (rounded shoulders) Lumbar  Assessment Lumbar Assessment: Exceptions to [facility] (posterior pelvic tilt)  Balance Dynamic Sitting Balance Dynamic Sitting - Balance Support: Feet supported;During functional activity Dynamic Sitting - Level of Assistance: 5: Stand by assistance Extremity/Trunk Assessment RUE Assessment RUE Assessment: Exceptions to [facility] Active Range of Motion (AROM) Comments: ~120 degrees shoulder flex General Strength Comments: 3+/5, RA LUE Assessment LUE Assessment: Exceptions to [facility] Active Range of Motion (AROM) Comments: ~120 degrees shoulder flex General Strength Comments: 3+/5, has RA  Leotis Shames [facility] 02/14/2022, 9:32 AM

## 2022-02-15 DIAGNOSIS — I495 Sick sinus syndrome: Secondary | ICD-10-CM

## 2022-02-15 LAB — GLUCOSE, CAPILLARY
Glucose-Capillary: 124 mg/dL — ABNORMAL HIGH (ref 70–99)
Glucose-Capillary: 239 mg/dL — ABNORMAL HIGH (ref 70–99)
Glucose-Capillary: 239 mg/dL — ABNORMAL HIGH (ref 70–99)

## 2022-02-15 MED ORDER — DICLOFENAC SODIUM 1 % EX GEL: 4.0000 g | Freq: Four times a day (QID) | CUTANEOUS | Status: DC

## 2022-02-15 MED ORDER — CHLORHEXIDINE GLUCONATE CLOTH 2 % EX PADS: 6.0000 | MEDICATED_PAD | Freq: Two times a day (BID) | CUTANEOUS | Status: DC

## 2022-02-15 MED ORDER — INSULIN ASPART 100 UNIT/ML FLEXPEN: 2.0000 [IU] | PEN_INJECTOR | Freq: Three times a day (TID) | SUBCUTANEOUS | 11 refills | Status: DC

## 2022-02-15 MED ORDER — CEFAZOLIN IV (FOR PTA / DISCHARGE USE ONLY): 2.0000 g | Freq: Two times a day (BID) | INTRAVENOUS | 0 refills | Status: AC

## 2022-02-15 MED ORDER — PREDNISONE 5 MG PO TABS: 5.0000 mg | ORAL_TABLET | Freq: Every day | ORAL | 0 refills | Status: DC

## 2022-02-15 MED ORDER — MEGESTROL ACETATE 400 MG/10ML PO SUSP: 400.0000 mg | Freq: Two times a day (BID) | ORAL | 0 refills | Status: DC

## 2022-02-15 MED ORDER — TRAZODONE HCL 50 MG PO TABS: 25.0000 mg | ORAL_TABLET | Freq: Every evening | ORAL | 0 refills | Status: DC | PRN

## 2022-02-15 MED ORDER — SODIUM CHLORIDE 0.9 % IV SOLN: 0.0000 mL | INTRAVENOUS | 0 refills | Status: DC | PRN

## 2022-02-15 MED ORDER — HEPARIN SOD (PORK) LOCK FLUSH 100 UNIT/ML IV SOLN
250.0000 [IU] | INTRAVENOUS | Status: AC | PRN
  Administered 2022-02-15: 250 [IU]

## 2022-02-15 MED ORDER — POLYETHYLENE GLYCOL 3350 17 G PO PACK: 17.0000 g | PACK | Freq: Every day | ORAL | 0 refills | Status: DC | PRN

## 2022-02-15 MED ORDER — AMIODARONE HCL 200 MG PO TABS: 200.0000 mg | ORAL_TABLET | Freq: Every day | ORAL | 0 refills | Status: DC

## 2022-02-15 MED ORDER — LIDOCAINE 5 % EX PTCH: 1.0000 | MEDICATED_PATCH | CUTANEOUS | 0 refills | Status: DC

## 2022-02-15 MED ORDER — VANCOMYCIN HCL 125 MG PO CAPS: 125.0000 mg | ORAL_CAPSULE | Freq: Two times a day (BID) | ORAL | 0 refills | Status: DC

## 2022-02-15 MED ORDER — METHOCARBAMOL 500 MG PO TABS: 500.0000 mg | ORAL_TABLET | Freq: Four times a day (QID) | ORAL | 0 refills | Status: DC | PRN

## 2022-02-15 MED ORDER — ENSURE ENLIVE PO LIQD: 237.0000 mL | Freq: Two times a day (BID) | ORAL | 12 refills | Status: DC

## 2022-02-15 MED ORDER — ACETAMINOPHEN 325 MG PO TABS: 360.0000 mg | ORAL_TABLET | Freq: Four times a day (QID) | ORAL | Status: DC

## 2022-02-15 MED ORDER — INSULIN DEGLUDEC 100 UNIT/ML ~~LOC~~ SOLN: 4.0000 [IU] | Freq: Every day | SUBCUTANEOUS | Status: DC

## 2022-02-15 NOTE — Telephone Encounter (Signed)
Spoke with pt's daughter, DPR who is calling as pt is being discharged from the hospital today.  She states she was advised to schedule an appointment with cardiology for hospital follow up. Appointment scheduled with Richardson Dopp, PA-C for 02/21/2022 at 155pm.  Pt's daughter verbalizes understanding and agrees with current plan.

## 2022-02-15 NOTE — Progress Notes (Signed)
Inpatient Rehabilitation Discharge Medication Review by a Pharmacist  A complete drug regimen review was completed for this patient to identify any potential clinically significant medication issues.  High Risk Drug Classes Is patient taking? Indication by Medication  Antipsychotic No   Anticoagulant No   Antibiotic Yes, as an intravenous medication IV Cefazolin - ICD infection PO vanc - Cdiff ppx  Opioid No   Antiplatelet No   Hypoglycemics/insulin Yes Insulin - DM  Vasoactive Medication Yes Amiodarone - Afib  Chemotherapy No   Other Yes Megestrol - appetite Methocarbamol - prn spasms Trazodone - prn sleep Atorvastatin - HLD  Gabapentin, Lidocaine patch-pain  Pantoprazole - Reflux Prednisone - RA     Type of Medication Issue Identified Description of Issue Recommendation(s)  Drug Interaction(s) (clinically significant)     Duplicate Therapy     Allergy     No Medication Administration End Date     Incorrect Dose     Additional Drug Therapy Needed     Significant med changes from prior encounter (inform family/care partners about these prior to discharge).    Other       Clinically significant medication issues were identified that warrant physician communication and completion of prescribed/recommended actions by midnight of the next day:  No  Pharmacist comments: None  Time spent performing this drug regimen review (minutes):  20 minutes  Thank you Anette Guarneri, PharmD

## 2022-02-15 NOTE — Progress Notes (Signed)
PA provided discharge instructions to patient and family. Staff assisted patient off the unit and into private car w/family safely. Patient discharged.    Yehuda Mao, LPN

## 2022-02-15 NOTE — Progress Notes (Signed)
Inpatient Rehabilitation Care Coordinator Discharge Note   Patient Details  Name: Shelby Walker MRN: [id number] Date of Birth: 07/08/1933   Discharge location: D/c to home  Length of Stay: 12 days  Discharge activity level: Supervision  Home/community participation: Limited  Patient response [id number] Literacy - How often do you need to have someone help you when you read instructions, pamphlets, or other written material from your doctor or pharmacy?: Never  Patient response [id number] Isolation - How often do you feel lonely or isolated from those around you?: Never  Services provided included: MD, RD, PT, OT, RN, CM, Pharmacy, Neuropsych, SW, TR  Financial Services:  Charity fundraiser Utilized: Medicare    Choices offered to/list presented to: Yes  Follow-up services arranged:  Home Health, DME Home Health Agency: [place] for HHPT/OT/SN/aide    DME : [facility] for IV abx; no other DME needs    Patient response to transportation need: Is the patient able to respond to transportation needs?: Yes In the past 12 months, has lack of transportation kept you from medical appointments or from getting medications?: No In the past 12 months, has lack of transportation kept you from meetings, work, or from getting things needed for daily living?: No   Comments (or additional information):  Patient/Family verbalized understanding of follow-up arrangements:  Yes  Individual responsible for coordination of the follow-up plan: contact pt dtr [name]  Confirmed correct DME delivered: Rana Snare 02/15/2022    Rana Snare

## 2022-02-15 NOTE — Telephone Encounter (Signed)
  Pt's daughter calling back to f/u. She ask if someone can call her back today

## 2022-02-15 NOTE — Progress Notes (Addendum)
PROGRESS NOTE   Subjective/Complaints: No new complaints or concerns. Planning to go home later today.   Review of Systems  Constitutional:  Negative for chills and fever.  HENT:  Negative for congestion.   Eyes:  Negative for double vision.  Respiratory:  Negative for shortness of breath.   Cardiovascular:  Negative for chest pain and leg swelling.  Gastrointestinal:  Negative for abdominal pain, nausea and vomiting.  Genitourinary: Negative.   Musculoskeletal:  Positive for back pain and joint pain.  Skin:  Negative for rash.  Neurological:  Positive for weakness. Negative for headaches.  Psychiatric/Behavioral:  Negative for depression.       Objective:   No results found. No results for input(s): "WBC", "HGB", "HCT", "PLT" in the last 72 hours.  Recent Labs    02/14/22 0413  NA 140  K 4.2  CL 111  CO2 22  GLUCOSE 120*  BUN 20  CREATININE 1.17*  CALCIUM 8.3*     Intake/Output Summary (Last 24 hours) at 02/15/2022 0828 Last data filed at 02/15/2022 0730 Gross per 24 hour  Intake 1070 ml  Output --  Net 1070 ml         Physical Exam: Vital Signs Blood pressure (!) 153/51, pulse 71, temperature 98.3 F (36.8 C), resp. rate 16, weight 52.5 kg, SpO2 98 %.   General: No acute distress, sitting in chair Mood and affect are appropriate Heart: Regular rate and rhythm no rubs murmurs or extra sounds Lungs: Clear to auscultation, breathing unlabored, no rales or wheezes, non-labored braething Abdomen: Positive bowel sounds, soft nontender to palpation, nondistended Extremities: No clubbing, cyanosis, or edema Psych: very pleasant Neuro:  Alert and oriented x 3. Follows commands. Normal language and speech. CN 2-12 intact, follows commands. Motor 4/5 UE, 3->4/5 LE prox to distal. No focal sensory findings.  Musculoskeletal: TMA well shaped. Normal ROM, fair sitting posture . Mild L knee joint line  tenderness. No abnormal tone   Assessment/Plan: 1. Functional deficits which require 3+ hours per day of interdisciplinary therapy in a comprehensive inpatient rehab setting. Physiatrist is providing close team supervision and 24 hour management of active medical problems listed below. Physiatrist and rehab team continue to assess barriers to discharge/monitor patient progress toward functional and medical goals  Care Tool:  Bathing    Body parts bathed by patient: Right arm, Left arm, Chest, Abdomen, Front perineal area, Right upper leg, Buttocks, Left upper leg, Right lower leg, Left lower leg, Face         Bathing assist Assist Level: Supervision/Verbal cueing     Upper Body Dressing/Undressing Upper body dressing   What is the patient wearing?: Pull over shirt, Bra    Upper body assist Assist Level: Independent    Lower Body Dressing/Undressing Lower body dressing      What is the patient wearing?: Underwear/pull up, Pants     Lower body assist Assist for lower body dressing: Supervision/Verbal cueing     Toileting Toileting    Toileting assist Assist for toileting: Supervision/Verbal cueing     Transfers Chair/bed transfer  Transfers assist     Chair/bed transfer assist level: Independent with assistive device Chair/bed transfer  assistive device: Armrests, Museum/gallery exhibitions officer assist      Assist level: Supervision/Verbal cueing Assistive device: Walker-rolling Max distance: 278f   Walk 10 feet activity   Assist     Assist level: Supervision/Verbal cueing Assistive device: Walker-rolling   Walk 50 feet activity   Assist    Assist level: Supervision/Verbal cueing Assistive device: Walker-rolling    Walk 150 feet activity   Assist Walk 150 feet activity did not occur: Safety/medical concerns (dizziness)  Assist level: Supervision/Verbal cueing Assistive device: Walker-rolling    Walk 10 feet on uneven  surface  activity   Assist Walk 10 feet on uneven surfaces activity did not occur: Safety/medical concerns (dizziness)         Wheelchair     Assist Is the patient using a wheelchair?: Yes Type of Wheelchair: Manual    Wheelchair assist level: Minimal Assistance - Patient > 75% Max wheelchair distance: 30    Wheelchair 50 feet with 2 turns activity    Assist    Wheelchair 50 feet with 2 turns activity did not occur:  (bil UE fatigue; pt propelled 30')   Assist Level: Moderate Assistance - Patient 50 - 74%   Wheelchair 150 feet activity     Assist  Wheelchair 150 feet activity did not occur:  (bil UE fatigue; pt propelled 30')   Assist Level: Total Assistance - Patient < 25%   Blood pressure (!) 153/51, pulse 71, temperature 98.3 F (36.8 C), resp. rate 16, weight 52.5 kg, SpO2 98 %.  Medical Problem List and Plan: 1. Functional deficits secondary to debility secondary to sepsis.              -patient may shower             -ELOS/Goals: 12/14, sup            -Continue CIR therapies including PT, OT   -DC home today    2.  Antithrombotics: -DVT/anticoagulation:  Pharmaceutical: Lovenox>> start 30 mg daily             -antiplatelet therapy: None 3. Pain Management: Scheduled Tylenol '650mg'$  for back pain.  Continue Lidoderm patches.  Aquathermia pad. 4. Mood/Behavior/Sleep: LCSW to evaluate and provide emotional support             -antipsychotic agents: n/a 5. Neuropsych/cognition: This patient is capable of making decisions on her own behalf. 6. Skin/Wound Care: Routine skin care checks 7. Fluids/Electrolytes/Nutrition: Strict I's and O's and follow-up chemistries             -Ensure with meals             -change to carb modified diet   12/3 intake remains poor-20-25% at best yesterday -continue trial of megace to boost appetite -recent bmet wnl except for mild CR elevation which has been baseline -12/4 Cr up to 1.8, Bun 32, start low dose IVF  531mhr NS -12/5 Cr a little improved to 1.68, continue IVF for now for AKI -12/7 will dc IVF today, Cr down to 1.48 -12/13 Cr improved today to 1.17   8: Paroxysmal atrial fibrillation: s/p ILR on 06/2021; no AC    9: Sinus node dysfunction s/p [employer] pacemaker 01/05/2022; Dr. G Beckie Salts           -subsequent generator removal 11/27             -continue Pacerone 200 mg daily  -ziopatch until tomorrow  10: Diastolic dysfunction: Daily weight, monitor electrolytes             -continue Lasix 20 mg daily  -Lasix has been held -Weight stable, does not appear to have fluid overload -Hold lasix, consider as outpatient   Filed Weights   02/13/22 0500 02/14/22 0740 02/15/22 0500  Weight: 58.3 kg 57.3 kg 52.5 kg      11: Diabetes mellitus: CBGs q AC and q HS; carb modified diet             -continue SSI (may be able to dc when on CM diet)             -at home on Tresiba 6-8 units at bedtime, Novolog 3 units with meals   CBG (last 3)  Recent Labs    02/14/22 1614 02/14/22 2044 02/15/22 0453  GLUCAP 144* 180* 124*     12/3  fair control so far but she's still not eating much  12/12 start semglee 4 units HS, will do 2 units novolog with meals  12/14 Continue current medications. stable  12: Gastroesophageal reflux disease: continue Protonix   13: Hyperlipidemia: follow-up LFTs/restart Lipitor 20 mg daily at discharge   14: Rheumatoid arthritis: continue prednisone 5 mg daily   -12/4 Voltaren gel for L knee pain started  15: Paroxysmal atrial fibrillation: s/p ILR on 06/2021; no AC              -Zio patch    16: Chronic kidney disease:   -12/13 Cr improved to 1.17, BN 20   17: Hypertension: monitor TID and prn             -continue Norvasc 10 mg daily             -continue Lasix 20 mg daily  -12/4 BP low today, continue to hold norvasc and lasix  12/5 continue to hold norvasc and lasix, BP still a little soft  12/14 stable overall, continue current  medications      Vitals:   02/14/22 2028 02/15/22 0403  BP: 102/80 (!) 153/51  Pulse: 66 71  Resp: 16 16  Temp: 98.2 F (36.8 C) 98.3 F (36.8 C)  SpO2: 99% 98%    18: Nausea: will schedule Zofran prior to antibiotic administration   19: 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 Tommy Medal 6 weeks of IV ancef given intracardiac device infection, recheck imaging prior to stopping IV abx in case she has psoas abscess  -Continue f/u with ID as outpatient      Latest Ref Rng & Units 02/12/2022    3:49 AM 02/09/2022   10:42 AM 02/08/2022    4:25 AM  CBC  WBC 4.0 - 10.5 K/uL 5.3  5.1  5.0   Hemoglobin 12.0 - 15.0 g/dL 8.1  8.5  7.6   Hematocrit 36.0 - 46.0 % 26.3  26.8  23.8   Platelets 150 - 400 K/uL 217  243  226     20: History of C. difficile colitis: No evidence of recurrent infection             -continue oral vancomycin 125 mg BID while on cefazolin and 1 week thereafter             -continue Florastor  -12/6 ID supportive continued prophylactic vancomycin- has loose stools but may be abx  assosciated   21: Zoster, left posterior hip: continue valacylovir 1000 mg daily   -now just on contact precautions  22. Anemia:  Hgb fluctuating between 8 and 10. Likely d/t chronic disease/CKD  -check Iron panel tomorrow  -12/7 HGB down to 7.6 today, recheck occult blood stool test  -12/11 stable HGB 8.1  23. Constipation  C/o incont loose stool but not recorded as such -02/12/22 LBM today  24. Poor appetite   -Continue megace , family reports appetite is improving   LOS: 13 days A FACE TO FACE EVALUATION WAS PERFORMED  Shelby Walker 02/15/2022, 8:28 AM

## 2022-02-16 ENCOUNTER — Ambulatory Visit: Payer: Medicare Other | Admitting: Podiatry

## 2022-02-16 DIAGNOSIS — E1151 Type 2 diabetes mellitus with diabetic peripheral angiopathy without gangrene: Secondary | ICD-10-CM | POA: Diagnosis not present

## 2022-02-16 DIAGNOSIS — N189 Chronic kidney disease, unspecified: Secondary | ICD-10-CM | POA: Diagnosis not present

## 2022-02-16 DIAGNOSIS — B029 Zoster without complications: Secondary | ICD-10-CM | POA: Diagnosis not present

## 2022-02-16 DIAGNOSIS — M810 Age-related osteoporosis without current pathological fracture: Secondary | ICD-10-CM | POA: Diagnosis not present

## 2022-02-16 DIAGNOSIS — K6812 Psoas muscle abscess: Secondary | ICD-10-CM | POA: Diagnosis not present

## 2022-02-16 DIAGNOSIS — I051 Rheumatic mitral insufficiency: Secondary | ICD-10-CM | POA: Diagnosis not present

## 2022-02-16 DIAGNOSIS — T827XXA Infection and inflammatory reaction due to other cardiac and vascular devices, implants and grafts, initial encounter: Secondary | ICD-10-CM | POA: Diagnosis not present

## 2022-02-16 DIAGNOSIS — K5900 Constipation, unspecified: Secondary | ICD-10-CM | POA: Diagnosis not present

## 2022-02-16 DIAGNOSIS — M4646 Discitis, unspecified, lumbar region: Secondary | ICD-10-CM | POA: Diagnosis not present

## 2022-02-16 DIAGNOSIS — E785 Hyperlipidemia, unspecified: Secondary | ICD-10-CM | POA: Diagnosis not present

## 2022-02-16 DIAGNOSIS — E1169 Type 2 diabetes mellitus with other specified complication: Secondary | ICD-10-CM | POA: Diagnosis not present

## 2022-02-16 DIAGNOSIS — I2510 Atherosclerotic heart disease of native coronary artery without angina pectoris: Secondary | ICD-10-CM | POA: Diagnosis not present

## 2022-02-16 DIAGNOSIS — E1122 Type 2 diabetes mellitus with diabetic chronic kidney disease: Secondary | ICD-10-CM | POA: Diagnosis not present

## 2022-02-16 DIAGNOSIS — A4101 Sepsis due to Methicillin susceptible Staphylococcus aureus: Secondary | ICD-10-CM | POA: Diagnosis not present

## 2022-02-16 DIAGNOSIS — K219 Gastro-esophageal reflux disease without esophagitis: Secondary | ICD-10-CM | POA: Diagnosis not present

## 2022-02-16 DIAGNOSIS — M069 Rheumatoid arthritis, unspecified: Secondary | ICD-10-CM | POA: Diagnosis not present

## 2022-02-16 DIAGNOSIS — M4626 Osteomyelitis of vertebra, lumbar region: Secondary | ICD-10-CM | POA: Diagnosis not present

## 2022-02-16 DIAGNOSIS — I1310 Hypertensive heart and chronic kidney disease without heart failure, with stage 1 through stage 4 chronic kidney disease, or unspecified chronic kidney disease: Secondary | ICD-10-CM | POA: Diagnosis not present

## 2022-02-16 DIAGNOSIS — E1136 Type 2 diabetes mellitus with diabetic cataract: Secondary | ICD-10-CM | POA: Diagnosis not present

## 2022-02-16 DIAGNOSIS — H9190 Unspecified hearing loss, unspecified ear: Secondary | ICD-10-CM | POA: Diagnosis not present

## 2022-02-16 DIAGNOSIS — N179 Acute kidney failure, unspecified: Secondary | ICD-10-CM | POA: Diagnosis not present

## 2022-02-16 DIAGNOSIS — D631 Anemia in chronic kidney disease: Secondary | ICD-10-CM | POA: Diagnosis not present

## 2022-02-16 DIAGNOSIS — K5790 Diverticulosis of intestine, part unspecified, without perforation or abscess without bleeding: Secondary | ICD-10-CM | POA: Diagnosis not present

## 2022-02-16 DIAGNOSIS — N12 Tubulo-interstitial nephritis, not specified as acute or chronic: Secondary | ICD-10-CM | POA: Diagnosis not present

## 2022-02-16 DIAGNOSIS — I480 Paroxysmal atrial fibrillation: Secondary | ICD-10-CM | POA: Diagnosis not present

## 2022-02-17 DIAGNOSIS — K6812 Psoas muscle abscess: Secondary | ICD-10-CM | POA: Diagnosis not present

## 2022-02-17 DIAGNOSIS — M4646 Discitis, unspecified, lumbar region: Secondary | ICD-10-CM | POA: Diagnosis not present

## 2022-02-17 DIAGNOSIS — E1169 Type 2 diabetes mellitus with other specified complication: Secondary | ICD-10-CM | POA: Diagnosis not present

## 2022-02-17 DIAGNOSIS — A4101 Sepsis due to Methicillin susceptible Staphylococcus aureus: Secondary | ICD-10-CM | POA: Diagnosis not present

## 2022-02-17 DIAGNOSIS — T827XXA Infection and inflammatory reaction due to other cardiac and vascular devices, implants and grafts, initial encounter: Secondary | ICD-10-CM | POA: Diagnosis not present

## 2022-02-17 DIAGNOSIS — M4626 Osteomyelitis of vertebra, lumbar region: Secondary | ICD-10-CM | POA: Diagnosis not present

## 2022-02-19 DIAGNOSIS — M4646 Discitis, unspecified, lumbar region: Secondary | ICD-10-CM | POA: Diagnosis not present

## 2022-02-19 DIAGNOSIS — A4101 Sepsis due to Methicillin susceptible Staphylococcus aureus: Secondary | ICD-10-CM | POA: Diagnosis not present

## 2022-02-19 DIAGNOSIS — E1169 Type 2 diabetes mellitus with other specified complication: Secondary | ICD-10-CM | POA: Diagnosis not present

## 2022-02-19 DIAGNOSIS — M4626 Osteomyelitis of vertebra, lumbar region: Secondary | ICD-10-CM | POA: Diagnosis not present

## 2022-02-19 DIAGNOSIS — A4189 Other specified sepsis: Secondary | ICD-10-CM | POA: Diagnosis not present

## 2022-02-19 DIAGNOSIS — K6812 Psoas muscle abscess: Secondary | ICD-10-CM | POA: Diagnosis not present

## 2022-02-19 DIAGNOSIS — T827XXA Infection and inflammatory reaction due to other cardiac and vascular devices, implants and grafts, initial encounter: Secondary | ICD-10-CM | POA: Diagnosis not present

## 2022-02-21 ENCOUNTER — Ambulatory Visit (INDEPENDENT_AMBULATORY_CARE_PROVIDER_SITE_OTHER): Payer: Medicare Other | Admitting: Infectious Disease

## 2022-02-21 ENCOUNTER — Other Ambulatory Visit: Payer: Self-pay

## 2022-02-21 ENCOUNTER — Ambulatory Visit: Payer: Medicare Other

## 2022-02-21 ENCOUNTER — Telehealth: Payer: Self-pay

## 2022-02-21 ENCOUNTER — Encounter: Payer: Self-pay | Admitting: Physician Assistant

## 2022-02-21 ENCOUNTER — Ambulatory Visit: Payer: Medicare Other | Attending: Physician Assistant | Admitting: Physician Assistant

## 2022-02-21 ENCOUNTER — Ambulatory Visit: Payer: Medicare Other | Admitting: Cardiovascular Disease

## 2022-02-21 VITALS — BP 130/72 | HR 64 | Ht 61.0 in | Wt 113.2 lb

## 2022-02-21 VITALS — BP 145/80 | HR 77 | Temp 97.4°F | Wt 110.0 lb

## 2022-02-21 DIAGNOSIS — B9561 Methicillin susceptible Staphylococcus aureus infection as the cause of diseases classified elsewhere: Secondary | ICD-10-CM

## 2022-02-21 DIAGNOSIS — T827XXD Infection and inflammatory reaction due to other cardiac and vascular devices, implants and grafts, subsequent encounter: Secondary | ICD-10-CM | POA: Diagnosis not present

## 2022-02-21 DIAGNOSIS — I2510 Atherosclerotic heart disease of native coronary artery without angina pectoris: Secondary | ICD-10-CM | POA: Diagnosis not present

## 2022-02-21 DIAGNOSIS — A0472 Enterocolitis due to Clostridium difficile, not specified as recurrent: Secondary | ICD-10-CM

## 2022-02-21 DIAGNOSIS — M4646 Discitis, unspecified, lumbar region: Secondary | ICD-10-CM

## 2022-02-21 DIAGNOSIS — A0471 Enterocolitis due to Clostridium difficile, recurrent: Secondary | ICD-10-CM

## 2022-02-21 DIAGNOSIS — Z7185 Encounter for immunization safety counseling: Secondary | ICD-10-CM | POA: Diagnosis not present

## 2022-02-21 DIAGNOSIS — E782 Mixed hyperlipidemia: Secondary | ICD-10-CM | POA: Insufficient documentation

## 2022-02-21 DIAGNOSIS — I480 Paroxysmal atrial fibrillation: Secondary | ICD-10-CM | POA: Diagnosis not present

## 2022-02-21 DIAGNOSIS — I455 Other specified heart block: Secondary | ICD-10-CM | POA: Diagnosis not present

## 2022-02-21 DIAGNOSIS — N1832 Chronic kidney disease, stage 3b: Secondary | ICD-10-CM

## 2022-02-21 DIAGNOSIS — R7881 Bacteremia: Secondary | ICD-10-CM

## 2022-02-21 DIAGNOSIS — Z89431 Acquired absence of right foot: Secondary | ICD-10-CM

## 2022-02-21 NOTE — Assessment & Plan Note (Signed)
Continue Lipitor 20 mg daily. 

## 2022-02-21 NOTE — Assessment & Plan Note (Signed)
She underwent pacemaker implantation in November.  Several weeks later, she was admitted with sepsis and had her pacemaker extracted.  She remains on IV antibiotics.  She has followed up with infectious disease already.  A 14-day ZIO was placed at discharge.  She just turned her monitor and recently.  Results are pending.  Follow-up with Dr. Caryl Comes in 4 to 6 weeks as noted.

## 2022-02-21 NOTE — Assessment & Plan Note (Signed)
She underwent coronary CTA in October that demonstrated hemodynamically significant stenosis in the first diagonal.  She is felt to be high risk for invasive cardiac evaluation such as cardiac catheterization.  Medical therapy has been recommended.  Prior to her hospitalizations, she was to start isosorbide.  Her family was concerned about doing this because her blood pressure was running low.  She has not been taking isosorbide.  Since she was discharged from the hospital, she has not had any chest discomfort to suggest angina.  She is still on IV antibiotics.  Her blood pressures are 953U systolic at home.  She continues to walk with a walker and is somewhat unsteady.  Since she is not having symptoms, I am hesitant to place her on isosorbide at this time.  She can use as needed nitroglycerin if she has chest pain.  She knows to contact us if she develops symptoms requiring nitroglycerin.  We could certainly consider adding isosorbide 15-30 mg at that point.  Given her anemia and DIC in the hospital, I do not think we should place her on aspirin at this time.  We can certainly consider this again in the future.  Continue Lipitor 20 mg daily.  Follow-up with Dr. Acie Fredrickson in 3 months.

## 2022-02-21 NOTE — Patient Instructions (Signed)
Medication Instructions:  Your physician recommends that you continue on your current medications as directed. Please refer to the Current Medication list given to you today.  *If you need a refill on your cardiac medications before your next appointment, please call your pharmacy*   Lab Work: None ordered  If you have labs (blood work) drawn today and your tests are completely normal, you will receive your results only by: [place] (if you have MyChart) OR A paper copy in the mail If you have any lab test that is abnormal or we need to change your treatment, we will call you to review the results.   Testing/Procedures: None ordered   Follow-Up: At [facility], you and your health needs are our priority.  As part of our continuing mission to provide you with exceptional heart care, we have created designated Provider Care Teams.  These Care Teams include your primary Cardiologist (physician) and Advanced Practice Providers (APPs -  Physician Assistants and Nurse Practitioners) who all work together to provide you with the care you need, when you need it.  We recommend signing up for the patient portal called "MyChart".  Sign up information is provided on this After Visit Summary.  MyChart is used to connect with patients for Virtual Visits (Telemedicine).  Patients are able to view lab/test results, encounter notes, upcoming appointments, etc.  Non-urgent messages can be sent to your provider as well.   To learn more about what you can do with MyChart, go to [email or web address].    Your next appointment:   3  month(s)  The format for your next appointment:   In Person  Provider:   Mertie Moores, MD     Other Instructions Your physician recommends that you schedule a follow-up appointment in: Dr. Caryl Comes 4-6 weeks, will need to cancel Dr. Lovena Le appointment   Important Information About Sugar

## 2022-02-21 NOTE — Progress Notes (Signed)
Cardiology Office Note:    Date:  02/21/2022   ID:  Shelby Walker, DOB 31-Jan-1934, MRN [id number]  PCP:  Shelby Walker, [facility] Cardiologist:  Shelby Moores, MD Electrophysiologist:  Shelby Axe, MD    Referring MD: Shelby Bowen, MD   Chief Complaint:  Hospitalization Follow-up    Patient Profile: Coronary artery disease  CCTA 12/13/2021: RCA <25, LM 50-69, LAD 50-69, OM1 1-24, >70; CAC score 426 // FFR consistent with abnormal flow in D1 >> Med Rx 1st due to adv age ? Tn during adx for sepsis >> ?demand ischemia - Med Rx  Paroxysmal atrial fibrillation Eliquis DC'd in past due to lack of recurrence Recurrent AF during sepsis adx in 01/2022 - Amio Rx started Deferred anticoagulation due to fall risk Sick sinus syndrome S/p Pacemaker 01/2022 (Dr. 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 TTE 01/05/22: EF 60-65, no RWMA, GR 1 DD, GLS -12.5, severe LAE, mild MR, trivial AI, RAP 3 TTE 01/25/2022: EF 50-55, no RWMA, GR 2 DD, normal RVSF, normal PASP (RVSP 34.7), moderate LAE, mild MR, mean MV gradient 4, AV sclerosis without stenosis, RAP 15 TEE 01/29/2022: EF 50-55, no evidence of vegetation/infective endocarditis Peripheral Arterial Disease w/ claudication  Walker Art [place] 6/18: R CIA >50, R Pop 50-74, L SFA 50-74 PV angiogram 10/19/2020: Moderate nonobstructive disease Mitral regurgitation Myoview 12/19/15: EF 77, no ischemia or infarction, low risk  Carotid artery disease [place] 12/2018: bilat ICA 1-39 Aortic atherosclerosis (CT in 08/2018) Diabetes mellitus  Hyperlipidemia  Chronic kidney disease  Rheumatoid arthritis    History of Present Illness:   Shelby Walker is a 88 y.o. female with the above problem list.  She was last seen by Shelby Walker 11/16/2021.  She complained of chest discomfort and was set up for coronary CTA which demonstrated hemodynamically significant stenosis by FFR in a first diagonal branch.  She had moderate  nonobstructive disease elsewhere.  Given her advanced age, Shelby Walker recommended initial medical therapy.  She was started on isosorbide.  She had previously undergone ILR implantation for monitoring of her atrial fibrillation.  This demonstrated a 25-second pause in October 2023.  She underwent dual-chamber permanent pacemaker implantation and removal of her ILR.  She was admitted 11/22-12/1 with MSSA sepsis complicated by DIC, AKI and elevated troponins.  It was felt that she had CIED infection and recommendation was to proceed with pacemaker explantation.  Her device was explanted by Dr. Lovena Walker.  She had recurrent atrial fibrillation was placed on amiodarone.  Anticoagulation was deferred secondary to falls.  She had normal wall motion and normal EF on echocardiogram.  It was felt that elevated troponin was likely related to demand ischemia (1059>>1789>>1991) ?Type II MI).  She was not felt to be a candidate for cardiac catheterization given DIC and AKI.  Conservative management was recommended.  She was discharged on antibiotics.  Dr. Lovena Walker recommended that she did not need another pacemaker unless she has symptomatic bradycardia.  Of note, she was discharged with a 14-day ZIO monitor.  Results are pending at this time.  She was discharged to inpatient rehab.  Of note, she was seen again by EP while she was in inpatient rehab for fatigue.  It was not felt that there were any indications for pacemaker at that time.  She has been seen by infectious disease since discharge.  She returns for follow-up.  She is here with her daughter.  She states that she  is feeling much better.  Since discharge from inpatient rehab, she has not had any further chest discomfort.  She does not feel like she has had any significant shortness of breath.  She does have dependent lower extremity edema.  She has not been using compression stockings.  She has not had orthopnea or syncope.  She has turned in her monitor.       EKG:  not done    Reviewed and updated this encounter:  Tobacco  Allergies  Meds  Problems  Med Hx  Surg Hx  Fam Hx     Review of Systems  Gastrointestinal:  Negative for hematochezia and melena.  Genitourinary:  Negative for hematuria.    Labs/Other Test Reviewed:   Recent Labs: 01/25/2022: B Natriuretic Peptide 2,919.6 02/02/2022: Magnesium 1.7 02/09/2022: ALT 5 02/12/2022: Hemoglobin 8.1; Platelets 217 02/14/2022: BUN 20; Creatinine, Ser 1.17; Potassium 4.2; Sodium 140   Recent Lipid Panel Recent Labs    01/05/22 0437  CHOL 149  TRIG 138  HDL 50  VLDL 28  LDLCALC 71    Risk Assessment/Calculations/Metrics:    CHA2DS2-VASc Score = 5   This indicates a 7.2% annual risk of stroke. The patient's score is based upon: CHF History: 0 HTN History: 0 Diabetes History: 1 Stroke History: 0 Vascular Disease History: 1 Age Score: 2 Gender Score: 1            Physical Exam:   VS:  BP 130/72   Pulse 64   Ht '5\' 1"'$  (1.549 m)   Wt 113 lb 3.2 oz (51.3 kg)   SpO2 98%   BMI 21.39 kg/m    Wt Readings from Last 3 Encounters:  02/21/22 113 lb 3.2 oz (51.3 kg)  02/21/22 110 lb (49.9 kg)  02/15/22 115 lb 11.9 oz (52.5 kg)    Constitutional:      Appearance: Healthy appearance. Not in distress.  Neck:     Vascular: JVD normal.  Pulmonary:     Effort: Pulmonary effort is normal.     Breath sounds: No wheezing. No rales.  Cardiovascular:     Normal rate. Regular rhythm. Normal S1. Normal S2.      Murmurs: There is no murmur.  Edema:    Peripheral edema present.    Pretibial: bilateral 1+ edema of the pretibial area. Abdominal:     Palpations: Abdomen is soft.         ASSESSMENT & PLAN:   Coronary artery disease involving native coronary artery of native heart without angina pectoris She underwent coronary CTA in October that demonstrated hemodynamically significant stenosis in the first diagonal.  She is felt to be high risk for invasive cardiac evaluation  such as cardiac catheterization.  Medical therapy has been recommended.  Prior to her hospitalizations, she was to start isosorbide.  Her family was concerned about doing this because her blood pressure was running low.  She has not been taking isosorbide.  Since she was discharged from the hospital, she has not had any chest discomfort to suggest angina.  She is still on IV antibiotics.  Her blood pressures are 811X systolic at home.  She continues to walk with a walker and is somewhat unsteady.  Since she is not having symptoms, I am hesitant to place her on isosorbide at this time.  She can use as needed nitroglycerin if she has chest pain.  She knows to contact us if she develops symptoms requiring nitroglycerin.  We could certainly consider adding isosorbide 15-30  mg at that point.  Given her anemia and DIC in the hospital, I do not think we should place her on aspirin at this time.  We can certainly consider this again in the future.  Continue Lipitor 20 mg daily.  Follow-up with Shelby Walker in 3 months.  Paroxysmal atrial fibrillation (HCC) She is maintaining sinus rhythm by exam today.  She had recurrent atrial fibrillation the hospital after pacemaker extraction.  The notes indicate she was placed on amiodarone for the short-term.  I am not certain as to how long to keep her on amiodarone.  I will try to get her back into see Dr. Caryl Comes in the next 4 to 6 weeks to help decide on when to stop her amiodarone.  She does note some tremors.  However, these have gotten better.  I have asked her to notify us in the next couple weeks if they do not completely resolved.  For now, continue amiodarone 200 mg daily.  If her tremors do not resolve, I will decrease her amiodarone to 200 mg daily except take 100 mg on Saturday and Sunday.  Sinus pause She underwent pacemaker implantation in November.  Several weeks later, she was admitted with sepsis and had her pacemaker extracted.  She remains on IV antibiotics.   She has followed up with infectious disease already.  A 14-day ZIO was placed at discharge.  She just turned her monitor and recently.  Results are pending.  Follow-up with Dr. Caryl Comes in 4 to 6 weeks as noted.  Mixed hyperlipidemia Continue Lipitor 20 mg daily.          Dispo:  Return in about 6 weeks (around 04/04/2022) for Routine Follow Up with Dr. Caryl Comes.  Medication Adjustments/Labs and Tests Ordered: Current medicines are reviewed at length with the patient today.  Concerns regarding medicines are outlined above.  Tests Ordered: No orders of the defined types were placed in this encounter.  Medication Changes: No orders of the defined types were placed in this encounter.  Signed, Richardson Dopp, PA-C  02/21/2022 5:06 PM    [address], [place], [place]  [address] Phone: [phone]; Fax: [phone]

## 2022-02-21 NOTE — Telephone Encounter (Signed)
Thank you :)

## 2022-02-21 NOTE — Telephone Encounter (Signed)
Per Dr. Tommy Medal extend patients IV antibiotics till she is seen by Dr. Candiss Norse on 03/12/22.  Sent a community message to Carolynn Sayers RN at [employer] to notify them of changes.

## 2022-02-21 NOTE — Assessment & Plan Note (Signed)
She is maintaining sinus rhythm by exam today.  She had recurrent atrial fibrillation the hospital after pacemaker extraction.  The notes indicate she was placed on amiodarone for the short-term.  I am not certain as to how long to keep her on amiodarone.  I will try to get her back into see Dr. Caryl Comes in the next 4 to 6 weeks to help decide on when to stop her amiodarone.  She does note some tremors.  However, these have gotten better.  I have asked her to notify us in the next couple weeks if they do not completely resolved.  For now, continue amiodarone 200 mg daily.  If her tremors do not resolve, I will decrease her amiodarone to 200 mg daily except take 100 mg on Saturday and Sunday.

## 2022-02-21 NOTE — Progress Notes (Signed)
Subjective:  Chief complaint still with some back pain and pain radiating down her left leg  Patient ID: Shelby Walker, female    DOB: [birth year], 88 y.o.   MRN: [id number]  HPI  88 year old Caucasian woman with a past medical history significant for biases rheumatoid arthritis diabetes mellitus with history of diabetic foot infection and osteomyelitis status post amputation on the right side who was admitted to [facility] with MSSA bacteremia sepsis and septic shock with pacemaker infection.  Her pacemaker was explanted.  MRI of the lumbar sacral spine showed new hyperintensity L3-L4 that radiology thought could be degenerative versus discitis as well as some edema in the left psoas muscle.  I have concerned that she does indeed have lumbar discitis and potentially a psoas muscle early abscess that she was developing in November.  On the whole she seems clinically better on cefazolin which she is on track to complete on March 10, 2021.  I would like to make sure we have repeat imaging of her spine prior to stopping the antibiotics.  If she still has residual infection form of a psoas abscess or discitis vertebral osteomyelitis I would favor being on some protracted oral antibiotics.  She does have a history of recurrent C. difficile colitis so if he chose oral antibiotics would need to choose a low risk antibiotic such as cefadroxil or perhaps even better doxycycline.  Currently that she is still on vancomycin prophylactically while she is on IV cefazolin.  Did have some problems with hearing after having had the most recent MRIs in the hospital and we will notify radiology about this.  Sounds as if she did not have proper earplugs in the time possibly?   Past Medical History:  Diagnosis Date   Bruit    Carotid Doppler showed no significant abnormality     9per patient)   C. difficile colitis 07/2018   with severe sepsis   Chest pain, unspecified    Nuclear, May, 2008,  no scar or ischemia   Diabetes mellitus    Diverticulosis    Dyslipidemia    Ejection fraction    EF 55-60%, echo, February, 2011 // Echocardiogram 8/21: EF 60-65, no RWMA, Gr 1 DD, GLS -14%, normal RVSF, mild LAE, trivial MR, mild MS (mean 4 mmHg), RVSP 23.4    GERD (gastroesophageal reflux disease)    Mitral regurgitation 04/21/2009   mild,  echo, February, 2011   Osteoporosis    Palpitations    possible very brief atrial fibrillation on monitor and possible reentrant tachycardia   Psoriasis    Rheumatoid arthritis ([place])     Past Surgical History:  Procedure Laterality Date   ABDOMINAL AORTOGRAM W/LOWER EXTREMITY Right 10/19/2020   Procedure: ABDOMINAL AORTOGRAM W/LOWER EXTREMITY;  Surgeon: Wellington Hampshire, MD;  Location: [place] [facility];  Service: Cardiovascular;  Laterality: Right;   FRACTURE SURGERY     LOOP RECORDER REMOVAL N/A 01/05/2022   Procedure: LOOP RECORDER REMOVAL;  Surgeon: Evans Lance, MD;  Location: [place] [facility];  Service: Cardiovascular;  Laterality: N/A;   PACEMAKER IMPLANT N/A 01/05/2022   Procedure: PACEMAKER IMPLANT;  Surgeon: Evans Lance, MD;  Location: [place] [facility];  Service: Cardiovascular;  Laterality: N/A;   PPM GENERATOR REMOVAL N/A 01/29/2022   Procedure: PPM GENERATOR REMOVAL;  Surgeon: Evans Lance, MD;  Location: [place] [facility];  Service: Cardiovascular;  Laterality: N/A;   TEE WITHOUT CARDIOVERSION N/A 01/29/2022   Procedure: TRANSESOPHAGEAL ECHOCARDIOGRAM (  TEE);  Surgeon: Geralynn Rile, MD;  Location: [place];  Service: Cardiovascular;  Laterality: N/A;   TRANSMETATARSAL AMPUTATION Right 01/18/2021   Procedure: TRANSMETATARSAL AMPUTATION;  Surgeon: Wylene Simmer, MD;  Location: [place];  Service: Orthopedics;  Laterality: Right;    Family History  Problem Relation Age of Onset   Heart failure Father    Heart attack Father    Diabetes Father    Heart attack Mother    Heart failure Mother    Diabetes  Mother    Stroke Sister       Social History   Socioeconomic History   Marital status: Widowed    Spouse name: [name]   Number of children: 2   Years of education: 13   Highest education level: Associate degree: academic program  Occupational History    Employer: RETIRED  Tobacco Use   Smoking status: Never   Smokeless tobacco: Never  Vaping Use   Vaping Use: Never used  Substance and Sexual Activity   Alcohol use: No    Alcohol/week: 0.0 standard drinks of alcohol   Drug use: No   Sexual activity: Yes    Birth control/protection: Post-menopausal  Other Topics Concern   Not on file  Social History Narrative   She is married to [place] and lives with him 2 children   Retired   No alcohol or drugs and never smoker   Social Determinants of Radio broadcast assistant Strain: Low Risk  (07/24/2018)   Overall Financial Resource Strain (CARDIA)    Difficulty of Paying Living Expenses: Not hard at all  Food Insecurity: No Food Insecurity (01/25/2022)   Hunger Vital Sign    Worried About Running Out of Food in the Last Year: Never true    [place] in the Last Year: Never true  Transportation Needs: No Transportation Needs (01/25/2022)   PRAPARE - Hydrologist (Medical): No    Lack of Transportation (Non-Medical): No  Physical Activity: Sufficiently Active (07/24/2018)   Exercise Vital Sign    Days of Exercise per Week: 5 days    Minutes of Exercise per Session: 30 min  Stress: No Stress Concern Present (07/24/2018)   [place]    Feeling of Stress : Not at all  Social Connections: [place] (07/24/2018)   Social Connection and Isolation Panel [NHANES]    Frequency of Communication with Friends and Family: More than three times a week    Frequency of Social Gatherings with Friends and Family: More than three times a week    Attends Religious Services: More than 4  times per year    Active Member of [employer] or Organizations: Yes    Attends Music therapist: More than 4 times per year    Marital Status: Married    Allergies  Allergen Reactions   Cholestyramine     Possible- Makes throat burn. Patient said she can't take it    Compazine [Prochlorperazine Edisylate] Swelling    REACTION: " tongue swell and unable to swallow"   Sulfonamide Derivatives Other (See Comments)    REACTION: " broke out with fine itching bumps"   Hydrocodone Other (See Comments)   Infliximab Other (See Comments)    Other reaction(s): rash   Leflunomide     Other reaction(s): diarrhea   Prochlorperazine Other (See Comments)    Other reaction(s): Unknown   Rosuvastatin     Other reaction(s):  muscle aches   Sulfa Antibiotics Other (See Comments)    Other reaction(s): tongue swelling     Current Outpatient Medications:    acetaminophen (TYLENOL) 325 MG tablet, Take 1 tablet (325 mg total) by mouth every 6 (six) hours., Disp: , Rfl:    amiodarone (PACERONE) 200 MG tablet, Take 1 tablet (200 mg total) by mouth daily., Disp: 30 tablet, Rfl: 0   atorvastatin (LIPITOR) 20 MG tablet, Take 1 tablet (20 mg total) by mouth daily., Disp: 90 tablet, Rfl: 3   Calcium Carbonate-Vitamin D 600-400 MG-UNIT tablet, Take 1 tablet by mouth daily at 12 noon. , Disp: , Rfl:    ceFAZolin (ANCEF) 2-4 GM/100ML-% IVPB, Inject 100 mLs (2 g total) into the vein 2 (two) times daily., Disp: 1 each, Rfl:    ceFAZolin (ANCEF) IVPB, Inject 2 g into the vein every 12 (twelve) hours. Indication:  ICD infection First Dose: No Last Day of Therapy:  03/11/22 Labs - Once weekly:  CBC/D and BMP, Labs - Every other week:  ESR and CRP Method of administration: IV Push Method of administration may be changed at the discretion of home infusion pharmacist based upon assessment of the patient and/or caregiver's ability to self-administer the medication ordered., Disp: 68 Units, Rfl: 0   Chlorhexidine  Gluconate Cloth 2 % PADS, Apply 6 each topically every 12 (twelve) hours., Disp: , Rfl:    diclofenac Sodium (VOLTAREN) 1 % GEL, Apply 4 g topically 4 (four) times daily., Disp: , Rfl:    feeding supplement (ENSURE ENLIVE / ENSURE PLUS) LIQD, Take 237 mLs by mouth 2 (two) times daily with a meal., Disp: 237 mL, Rfl: 12   gabapentin (NEURONTIN) 100 MG capsule, Take 1 capsule (100 mg total) by mouth 3 (three) times daily., Disp: 90 capsule, Rfl: 0   insulin aspart (NOVOLOG) 100 UNIT/ML FlexPen, Inject 2 Units into the skin 3 (three) times daily with meals. Sliding scale, Disp: 15 mL, Rfl: 11   Insulin Degludec (TRESIBA) 100 UNIT/ML SOLN, Inject 4 Units into the skin at bedtime., Disp: , Rfl:    lidocaine (LIDODERM) 5 %, Place 1 patch onto the skin daily. Remove & Discard patch within 12 hours or as directed by MD, Disp: 30 patch, Rfl: 0   megestrol (MEGACE) 400 MG/10ML suspension, Take 10 mLs (400 mg total) by mouth 2 (two) times daily., Disp: 480 mL, Rfl: 0   methocarbamol (ROBAXIN) 500 MG tablet, Take 1 tablet (500 mg total) by mouth every 6 (six) hours as needed for muscle spasms., Disp: 15 tablet, Rfl: 0   Multiple Vitamins-Minerals (CENTRUM SILVER 50+WOMEN PO), Take 1 tablet by mouth daily., Disp: , Rfl:    nitroGLYCERIN (NITROSTAT) 0.4 MG SL tablet, Dissolve 1 tablet under the tongue every 5 minutes as needed for chest pain. Max of 3 doses, then 911., Disp: 25 tablet, Rfl: 6   pantoprazole (PROTONIX) 40 MG tablet, Take 1 tablet (40 mg total) by mouth daily., Disp: , Rfl:    polyethylene glycol (MIRALAX / GLYCOLAX) 17 g packet, Take 17 g by mouth daily as needed for mild constipation., Disp: 14 each, Rfl: 0   predniSONE (DELTASONE) 5 MG tablet, Take 1 tablet (5 mg total) by mouth daily with breakfast., Disp: 30 tablet, Rfl: 0   pyrithione zinc (SELSUN BLUE DRY SCALP) 1 % shampoo, Apply 1 application topically once a week., Disp: , Rfl:    RESTASIS 0.05 % ophthalmic emulsion, Place 1 drop into both  eyes 2 (two) times daily.,  Disp: , Rfl:    saccharomyces boulardii (FLORASTOR) 250 MG capsule, Take 250 mg by mouth 2 (two) times daily., Disp: , Rfl:    sodium chloride 0.9 % infusion, Inject 0-10 mLs into the vein as needed (for administration of IV medications (carrier fluid))., Disp: , Rfl: 0   traZODone (DESYREL) 50 MG tablet, Take 0.5-1 tablets (25-50 mg total) by mouth at bedtime as needed for sleep., Disp: 15 tablet, Rfl: 0   vancomycin (VANCOCIN) 125 MG capsule, Take 1 capsule (125 mg total) by mouth 2 (two) times daily., Disp: 64 capsule, Rfl: 0    Review of Systems  Constitutional:  Negative for activity change, appetite change, chills, diaphoresis, fatigue, fever and unexpected weight change.  HENT:  Negative for congestion, rhinorrhea, sinus pressure, sneezing, sore throat and trouble swallowing.   Eyes:  Negative for photophobia and visual disturbance.  Respiratory:  Negative for cough, chest tightness, shortness of breath, wheezing and stridor.   Cardiovascular:  Negative for chest pain, palpitations and leg swelling.  Gastrointestinal:  Positive for diarrhea. Negative for abdominal distention, abdominal pain, anal bleeding, blood in stool, constipation, nausea and vomiting.  Genitourinary:  Negative for difficulty urinating, dysuria, flank pain and hematuria.  Musculoskeletal:  Positive for arthralgias and back pain. Negative for gait problem, joint swelling and myalgias.  Skin:  Negative for color change, pallor, rash and wound.  Neurological:  Negative for dizziness, tremors, weakness and light-headedness.  Hematological:  Negative for adenopathy. Does not bruise/bleed easily.  Psychiatric/Behavioral:  Negative for agitation, behavioral problems, confusion, decreased concentration, dysphoric mood and sleep disturbance.        Objective:   Physical Exam Constitutional:      General: She is not in acute distress.    Appearance: Normal appearance. She is well-developed. She  is not ill-appearing or diaphoretic.  HENT:     Head: Normocephalic and atraumatic.     Right Ear: Hearing and external ear normal.     Left Ear: Hearing and external ear normal.     Nose: No nasal deformity or rhinorrhea.  Eyes:     General: No scleral icterus.    Conjunctiva/sclera: Conjunctivae normal.     Right eye: Right conjunctiva is not injected.     Left eye: Left conjunctiva is not injected.     Pupils: Pupils are equal, round, and reactive to light.  Neck:     Vascular: No JVD.  Cardiovascular:     Rate and Rhythm: Normal rate and regular rhythm.     Heart sounds: S1 normal and S2 normal.  Pulmonary:     Effort: Pulmonary effort is normal. No respiratory distress.     Breath sounds: No wheezing.  Abdominal:     General: Bowel sounds are normal. There is no distension.     Palpations: Abdomen is soft.     Tenderness: There is no abdominal tenderness.  Musculoskeletal:        General: Normal range of motion.     Right shoulder: Normal.     Left shoulder: Normal.     Cervical back: Normal range of motion and neck supple.     Right hip: Normal.     Left hip: Normal.     Right knee: Normal.     Left knee: Normal.  Lymphadenopathy:     Head:     Right side of head: No submandibular, preauricular or posterior auricular adenopathy.     Left side of head: No submandibular, preauricular or posterior auricular  adenopathy.     Cervical: No cervical adenopathy.     Right cervical: No superficial or deep cervical adenopathy.    Left cervical: No superficial or deep cervical adenopathy.  Skin:    General: Skin is warm and dry.     Coloration: Skin is not pale.     Findings: No abrasion, bruising, ecchymosis, erythema, lesion or rash.     Nails: There is no clubbing.  Neurological:     General: No focal deficit present.     Mental Status: She is alert and oriented to person, place, and time.     Sensory: No sensory deficit.     Coordination: Coordination normal.     Gait:  Gait normal.  Psychiatric:        Attention and Perception: She is attentive.        Mood and Affect: Mood normal.        Speech: Speech normal.        Behavior: Behavior normal. Behavior is cooperative.        Thought Content: Thought content normal.        Judgment: Judgment normal.    PIcc line 02/22/22:    Right foot :    Left foot        Assessment & Plan:   MSSA bacteremia with septic shock, pacemaker infection, post explantation also with presence of vertebral discitis and potential paraspinous infection.  We will continue cefazolin  Order an MRI with and without contrast which I am hoping will be done before she completes her IV antibiotics have extend the IV antibiotics to the date of her follow-up with my partner Dr. Candiss Norse who can then review the MRI findings and how the patient is doing and decide whether or not to switch her over to oral antibiotic therapy.  History of recurrent C. difficile colitis:  She will continue on prophylactic vancomycin at present.  Vaccine counseling recommended updated COVID-19 vaccine which she received  I spent 44 minutes with the patient including than 50% of the time in face to face counseling of the patient daughter regarding the nature of MSSA bacteremia pacemaker infections discitis, C. difficile colitis,  along with review of medical records in preparation for the visit and during the visit and in coordination of her care.

## 2022-02-22 DIAGNOSIS — M4646 Discitis, unspecified, lumbar region: Secondary | ICD-10-CM | POA: Diagnosis not present

## 2022-02-22 DIAGNOSIS — K6812 Psoas muscle abscess: Secondary | ICD-10-CM | POA: Diagnosis not present

## 2022-02-22 DIAGNOSIS — M4626 Osteomyelitis of vertebra, lumbar region: Secondary | ICD-10-CM | POA: Diagnosis not present

## 2022-02-22 DIAGNOSIS — A4101 Sepsis due to Methicillin susceptible Staphylococcus aureus: Secondary | ICD-10-CM | POA: Diagnosis not present

## 2022-02-22 DIAGNOSIS — E1169 Type 2 diabetes mellitus with other specified complication: Secondary | ICD-10-CM | POA: Diagnosis not present

## 2022-02-22 DIAGNOSIS — T827XXA Infection and inflammatory reaction due to other cardiac and vascular devices, implants and grafts, initial encounter: Secondary | ICD-10-CM | POA: Diagnosis not present

## 2022-02-23 ENCOUNTER — Other Ambulatory Visit (HOSPITAL_COMMUNITY): Payer: Self-pay

## 2022-02-27 DIAGNOSIS — K6812 Psoas muscle abscess: Secondary | ICD-10-CM | POA: Diagnosis not present

## 2022-02-27 DIAGNOSIS — M4646 Discitis, unspecified, lumbar region: Secondary | ICD-10-CM | POA: Diagnosis not present

## 2022-02-27 DIAGNOSIS — A4101 Sepsis due to Methicillin susceptible Staphylococcus aureus: Secondary | ICD-10-CM | POA: Diagnosis not present

## 2022-02-27 DIAGNOSIS — E1169 Type 2 diabetes mellitus with other specified complication: Secondary | ICD-10-CM | POA: Diagnosis not present

## 2022-02-27 DIAGNOSIS — M4626 Osteomyelitis of vertebra, lumbar region: Secondary | ICD-10-CM | POA: Diagnosis not present

## 2022-02-27 DIAGNOSIS — T827XXA Infection and inflammatory reaction due to other cardiac and vascular devices, implants and grafts, initial encounter: Secondary | ICD-10-CM | POA: Diagnosis not present

## 2022-02-28 ENCOUNTER — Telehealth: Payer: Self-pay | Admitting: Cardiovascular Disease

## 2022-02-28 DIAGNOSIS — E782 Mixed hyperlipidemia: Secondary | ICD-10-CM

## 2022-02-28 DIAGNOSIS — I739 Peripheral vascular disease, unspecified: Secondary | ICD-10-CM

## 2022-02-28 MED ORDER — AMIODARONE HCL 200 MG PO TABS: 200.0000 mg | ORAL_TABLET | Freq: Every day | ORAL | 11 refills | Status: DC

## 2022-02-28 MED ORDER — ATORVASTATIN CALCIUM 20 MG PO TABS: 20.0000 mg | ORAL_TABLET | Freq: Every day | ORAL | 3 refills | Status: DC

## 2022-02-28 NOTE — Telephone Encounter (Signed)
*  STAT* If patient is at the pharmacy, call can be transferred to refill team.   1. Which medications need to be refilled? (please list name of each medication and dose if known)   amiodarone (PACERONE) 200 MG tablet    atorvastatin (LIPITOR) 20 MG tablet   2. Which pharmacy/location (including street and city if local pharmacy) is medication to be sent to?  [place], [address]    3. Do they need a 30 day or 90 day supply?  Amiodarone - 30 day Atorvastatin,- 90 day

## 2022-02-28 NOTE — Telephone Encounter (Signed)
Pt's medications were sent to pt's pharmacy as requested. Confirmation received.  

## 2022-03-01 ENCOUNTER — Ambulatory Visit: Payer: Medicare Other | Admitting: Internal Medicine

## 2022-03-01 DIAGNOSIS — M4626 Osteomyelitis of vertebra, lumbar region: Secondary | ICD-10-CM | POA: Diagnosis not present

## 2022-03-01 DIAGNOSIS — M4646 Discitis, unspecified, lumbar region: Secondary | ICD-10-CM | POA: Diagnosis not present

## 2022-03-01 DIAGNOSIS — T827XXA Infection and inflammatory reaction due to other cardiac and vascular devices, implants and grafts, initial encounter: Secondary | ICD-10-CM | POA: Diagnosis not present

## 2022-03-01 DIAGNOSIS — K6812 Psoas muscle abscess: Secondary | ICD-10-CM | POA: Diagnosis not present

## 2022-03-01 DIAGNOSIS — E1169 Type 2 diabetes mellitus with other specified complication: Secondary | ICD-10-CM | POA: Diagnosis not present

## 2022-03-01 DIAGNOSIS — A4101 Sepsis due to Methicillin susceptible Staphylococcus aureus: Secondary | ICD-10-CM | POA: Diagnosis not present

## 2022-03-04 ENCOUNTER — Telehealth: Payer: Self-pay | Admitting: Internal Medicine

## 2022-03-04 NOTE — Telephone Encounter (Signed)
Called by patients daughter regarding blood pressure. She has been taking off her BP meds since her BP has been running low. She checked it this evening on the forearm as she has a picc on one arm and glucose monitor on the other. BP was 187/83. She denied HA, chest pain, SOB. Said she felt fine and was acting normal. Daughter will recheck BP after patient rests 5 minutes and again in morning. If BP still running high she will call office in morning. If patient develops symptoms she will come to ED.

## 2022-03-05 DIAGNOSIS — M4626 Osteomyelitis of vertebra, lumbar region: Secondary | ICD-10-CM | POA: Diagnosis not present

## 2022-03-05 DIAGNOSIS — K6812 Psoas muscle abscess: Secondary | ICD-10-CM | POA: Diagnosis not present

## 2022-03-05 DIAGNOSIS — E1169 Type 2 diabetes mellitus with other specified complication: Secondary | ICD-10-CM | POA: Diagnosis not present

## 2022-03-05 DIAGNOSIS — M4646 Discitis, unspecified, lumbar region: Secondary | ICD-10-CM | POA: Diagnosis not present

## 2022-03-05 DIAGNOSIS — A4101 Sepsis due to Methicillin susceptible Staphylococcus aureus: Secondary | ICD-10-CM | POA: Diagnosis not present

## 2022-03-05 DIAGNOSIS — T827XXA Infection and inflammatory reaction due to other cardiac and vascular devices, implants and grafts, initial encounter: Secondary | ICD-10-CM | POA: Diagnosis not present

## 2022-03-07 ENCOUNTER — Telehealth: Payer: Self-pay | Admitting: Internal Medicine

## 2022-03-07 ENCOUNTER — Ambulatory Visit (HOSPITAL_COMMUNITY)
Admission: RE | Admit: 2022-03-07 | Discharge: 2022-03-07 | Disposition: A | Discharge status: Home / Self Care | Payer: Medicare Other | Source: Ambulatory Visit | Attending: Infectious Disease | Admitting: Infectious Disease

## 2022-03-07 DIAGNOSIS — B9561 Methicillin susceptible Staphylococcus aureus infection as the cause of diseases classified elsewhere: Secondary | ICD-10-CM | POA: Diagnosis not present

## 2022-03-07 DIAGNOSIS — M4626 Osteomyelitis of vertebra, lumbar region: Secondary | ICD-10-CM | POA: Diagnosis not present

## 2022-03-07 DIAGNOSIS — M4646 Discitis, unspecified, lumbar region: Secondary | ICD-10-CM | POA: Diagnosis not present

## 2022-03-07 DIAGNOSIS — A4101 Sepsis due to Methicillin susceptible Staphylococcus aureus: Secondary | ICD-10-CM | POA: Diagnosis not present

## 2022-03-07 DIAGNOSIS — T827XXA Infection and inflammatory reaction due to other cardiac and vascular devices, implants and grafts, initial encounter: Secondary | ICD-10-CM | POA: Diagnosis not present

## 2022-03-07 DIAGNOSIS — M5126 Other intervertebral disc displacement, lumbar region: Secondary | ICD-10-CM | POA: Diagnosis not present

## 2022-03-07 DIAGNOSIS — R7881 Bacteremia: Secondary | ICD-10-CM | POA: Diagnosis not present

## 2022-03-07 DIAGNOSIS — K6812 Psoas muscle abscess: Secondary | ICD-10-CM | POA: Diagnosis not present

## 2022-03-07 DIAGNOSIS — E1169 Type 2 diabetes mellitus with other specified complication: Secondary | ICD-10-CM | POA: Diagnosis not present

## 2022-03-07 MED ORDER — GADOBUTROL 1 MMOL/ML IV SOLN
5.0000 mL | Freq: Once | INTRAVENOUS | Status: AC | PRN
  Administered 2022-03-07: 5 mL via INTRAVENOUS

## 2022-03-07 NOTE — Telephone Encounter (Signed)
Patient's daughter called and wanted to ask questions regarding procedure

## 2022-03-08 DIAGNOSIS — E1169 Type 2 diabetes mellitus with other specified complication: Secondary | ICD-10-CM | POA: Diagnosis not present

## 2022-03-08 DIAGNOSIS — M4646 Discitis, unspecified, lumbar region: Secondary | ICD-10-CM | POA: Diagnosis not present

## 2022-03-08 DIAGNOSIS — A4101 Sepsis due to Methicillin susceptible Staphylococcus aureus: Secondary | ICD-10-CM | POA: Diagnosis not present

## 2022-03-08 DIAGNOSIS — M4626 Osteomyelitis of vertebra, lumbar region: Secondary | ICD-10-CM | POA: Diagnosis not present

## 2022-03-08 DIAGNOSIS — T827XXA Infection and inflammatory reaction due to other cardiac and vascular devices, implants and grafts, initial encounter: Secondary | ICD-10-CM | POA: Diagnosis not present

## 2022-03-08 DIAGNOSIS — K6812 Psoas muscle abscess: Secondary | ICD-10-CM | POA: Diagnosis not present

## 2022-03-09 DIAGNOSIS — I700 Atherosclerosis of aorta: Secondary | ICD-10-CM | POA: Diagnosis not present

## 2022-03-09 DIAGNOSIS — I130 Hypertensive heart and chronic kidney disease with heart failure and stage 1 through stage 4 chronic kidney disease, or unspecified chronic kidney disease: Secondary | ICD-10-CM | POA: Diagnosis not present

## 2022-03-09 DIAGNOSIS — I480 Paroxysmal atrial fibrillation: Secondary | ICD-10-CM | POA: Diagnosis not present

## 2022-03-09 DIAGNOSIS — R001 Bradycardia, unspecified: Secondary | ICD-10-CM | POA: Diagnosis not present

## 2022-03-09 DIAGNOSIS — N1832 Chronic kidney disease, stage 3b: Secondary | ICD-10-CM | POA: Diagnosis not present

## 2022-03-09 DIAGNOSIS — Z794 Long term (current) use of insulin: Secondary | ICD-10-CM | POA: Diagnosis not present

## 2022-03-09 DIAGNOSIS — I739 Peripheral vascular disease, unspecified: Secondary | ICD-10-CM | POA: Diagnosis not present

## 2022-03-09 DIAGNOSIS — E1140 Type 2 diabetes mellitus with diabetic neuropathy, unspecified: Secondary | ICD-10-CM | POA: Diagnosis not present

## 2022-03-09 DIAGNOSIS — Z89431 Acquired absence of right foot: Secondary | ICD-10-CM | POA: Diagnosis not present

## 2022-03-09 DIAGNOSIS — I25119 Atherosclerotic heart disease of native coronary artery with unspecified angina pectoris: Secondary | ICD-10-CM | POA: Diagnosis not present

## 2022-03-09 DIAGNOSIS — E46 Unspecified protein-calorie malnutrition: Secondary | ICD-10-CM | POA: Diagnosis not present

## 2022-03-09 DIAGNOSIS — D649 Anemia, unspecified: Secondary | ICD-10-CM | POA: Diagnosis not present

## 2022-03-12 ENCOUNTER — Ambulatory Visit (INDEPENDENT_AMBULATORY_CARE_PROVIDER_SITE_OTHER): Payer: Medicare Other | Admitting: Internal Medicine

## 2022-03-12 ENCOUNTER — Other Ambulatory Visit: Payer: Self-pay

## 2022-03-12 ENCOUNTER — Encounter: Payer: Self-pay | Admitting: Internal Medicine

## 2022-03-12 ENCOUNTER — Ambulatory Visit: Payer: Medicare Other | Admitting: Internal Medicine

## 2022-03-12 ENCOUNTER — Telehealth: Payer: Self-pay

## 2022-03-12 VITALS — BP 138/85 | Temp 98.4°F | Wt 111.0 lb

## 2022-03-12 DIAGNOSIS — R7881 Bacteremia: Secondary | ICD-10-CM | POA: Diagnosis not present

## 2022-03-12 DIAGNOSIS — B9561 Methicillin susceptible Staphylococcus aureus infection as the cause of diseases classified elsewhere: Secondary | ICD-10-CM | POA: Diagnosis not present

## 2022-03-12 MED ORDER — CEFADROXIL 1 G PO TABS: 1.0000 g | ORAL_TABLET | Freq: Two times a day (BID) | ORAL | 0 refills | Status: DC

## 2022-03-12 MED ORDER — VANCOMYCIN HCL 125 MG PO CAPS: 125.0000 mg | ORAL_CAPSULE | Freq: Two times a day (BID) | ORAL | 0 refills | Status: AC

## 2022-03-12 MED ORDER — CEFADROXIL 500 MG PO CAPS: 500.0000 mg | ORAL_CAPSULE | Freq: Two times a day (BID) | ORAL | 0 refills | Status: DC

## 2022-03-12 NOTE — Telephone Encounter (Signed)
Per Dr.Singh - stop IV cefazolin and pull PICC line ASAP.  Message sent to Carolynn Sayers, RN, [facility] staff, and [facility] pharmacists.    [place], CMA

## 2022-03-12 NOTE — Progress Notes (Unsigned)
Patient: Shelby Walker  DOB: Oct 10, 1933 MRN: [id number] PCP: Reynold Bowen, MD    Patient Active Problem List   Diagnosis Date Noted   Coronary artery disease involving native coronary artery of native heart without angina pectoris 02/21/2022   Discitis of lumbar region 02/05/2022   Herpes zoster without complication 40/10/6759   Clostridium difficile colitis 02/05/2022   Severe sepsis with acute organ dysfunction due to Gram positive bacteria ([place]) 02/02/2022   Pacemaker infection ([place]) 02/01/2022   Periostitis of lumbar spine without osteomyelitis ([place]) 02/01/2022   Acute midline low back pain without sciatica 01/26/2022   MSSA bacteremia 01/25/2022   Presence of cardiac pacemaker 01/25/2022   Severe sepsis with septic shock ([place]) 01/24/2022   AKI (acute kidney injury) ([place]) 01/24/2022   Sinus pause 01/04/2022   Acute viral bronchitis 02/06/2021   Acute blood loss anemia    Postoperative pain    S/P transmetatarsal amputation of foot, right ([place]) 01/24/2021   Protein-calorie malnutrition, severe 01/17/2021   Diabetic foot infection ([place]) 01/16/2021   Rheumatoid arthritis ([place]) 01/16/2021   Stage 3b chronic kidney disease (CKD) ([place]) 01/16/2021   DNR (do not resuscitate) 01/16/2021   Decreased anal sphincter tone 12/13/2018   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   Irritable bowel syndrome with diarrhea - post infectious 12/12/2018   Full incontinence of feces and fecal smearing 12/12/2018   Perianal dermatitis 12/12/2018   Uncontrolled type 2 diabetes mellitus with hyperglycemia ([place]) 10/26/2018   Long-term use of immunosuppressant medication-Enbrel 10/26/2018   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 08/27/2018   Thrombocytopenia ([place])    Essential hypertension    Dyspnea on exertion    Steroid-induced hyperglycemia    Fatigue 08/06/2018   Paroxysmal atrial fibrillation ([place])    Mixed hyperlipidemia 09/29/2016   Stable angina 09/29/2016   PAD (peripheral artery  disease) ([place]) 08/09/2016   Preoperative clearance 05/21/2012   Precordial chest pain    Palpitations    Diabetes mellitus (HCC)    GERD (gastroesophageal reflux disease)    Ejection fraction    Carotid artery disease ([place])    Mitral regurgitation 04/21/2009   PSORIASIS 02/09/2009   Rheumatoid arthritis flare ([place]) 02/09/2009     Subjective:  Shelby Walker is a 89 y.o. F with PMHx of diabetes, history of right TMA, CKD stage III, sinus arrest status post PPM 11/3 presents for hospital follow-up of MSSA bacteremia discharged cefazolin x 6 weeks EOT 03/11/2022.  MSSA bacteremia suspected secondary to PPM infection status post removal, complicated by possible discitis/vertebral osteomyelitis and psoas muscle infection. -11/22 blood Cx 2/2 MSSA -11/23 blood Cx NG -11/27 PPM removed Today: Pt reports feeling well, denies fevers and chills. Reports LLE weakness that feels worse.  Review of Systems  HENT:  Negative for hearing loss.   All other systems reviewed and are negative.   Past Medical History:  Diagnosis Date   Bruit    Carotid Doppler showed no significant abnormality     9per patient)   C. difficile colitis 07/2018   with severe sepsis   Chest pain, unspecified    Nuclear, May, 2008, no scar or ischemia   Diabetes mellitus    Diverticulosis    Dyslipidemia    Ejection fraction    EF 55-60%, echo, February, 2011 // Echocardiogram 8/21: EF 60-65, no RWMA, Gr 1 DD, GLS -14%, normal RVSF, mild LAE, trivial MR, mild MS (mean 4 mmHg), RVSP 23.4    GERD (gastroesophageal reflux disease)  Mitral regurgitation 04/21/2009   mild,  echo, February, 2011   Osteoporosis    Palpitations    possible very brief atrial fibrillation on monitor and possible reentrant tachycardia   Psoriasis    Rheumatoid arthritis ([place])     Outpatient Medications Prior to Visit  Medication Sig Dispense Refill   acetaminophen (TYLENOL) 325 MG tablet Take 1 tablet (325 mg total) by mouth every 6 (six)  hours.     amiodarone (PACERONE) 200 MG tablet Take 1 tablet (200 mg total) by mouth daily. 30 tablet 11   atorvastatin (LIPITOR) 20 MG tablet Take 1 tablet (20 mg total) by mouth daily. 90 tablet 3   Calcium Carbonate-Vitamin D 600-400 MG-UNIT tablet Take 1 tablet by mouth daily at 12 noon.      ceFAZolin (ANCEF) 2-4 GM/100ML-% IVPB Inject 100 mLs (2 g total) into the vein 2 (two) times daily. 1 each    ceFAZolin (ANCEF) IVPB Inject 2 g into the vein every 12 (twelve) hours. Indication:  ICD infection First Dose: No Last Day of Therapy:  03/11/22 Labs - Once weekly:  CBC/D and BMP, Labs - Every other week:  ESR and CRP Method of administration: IV Push Method of administration may be changed at the discretion of home infusion pharmacist based upon assessment of the patient and/or caregiver's ability to self-administer the medication ordered. 68 Units 0   Chlorhexidine Gluconate Cloth 2 % PADS Apply 6 each topically every 12 (twelve) hours.     diclofenac Sodium (VOLTAREN) 1 % GEL Apply 4 g topically 4 (four) times daily.     feeding supplement (ENSURE ENLIVE / ENSURE PLUS) LIQD Take 237 mLs by mouth 2 (two) times daily with a meal. 237 mL 12   gabapentin (NEURONTIN) 100 MG capsule Take 1 capsule (100 mg total) by mouth 3 (three) times daily. 90 capsule 0   insulin aspart (NOVOLOG) 100 UNIT/ML FlexPen Inject 2 Units into the skin 3 (three) times daily with meals. Sliding scale 15 mL 11   Insulin Degludec (TRESIBA) 100 UNIT/ML SOLN Inject 4 Units into the skin at bedtime.     lidocaine (LIDODERM) 5 % Place 1 patch onto the skin daily. Remove & Discard patch within 12 hours or as directed by MD 30 patch 0   megestrol (MEGACE) 400 MG/10ML suspension Take 10 mLs (400 mg total) by mouth 2 (two) times daily. 480 mL 0   methocarbamol (ROBAXIN) 500 MG tablet Take 1 tablet (500 mg total) by mouth every 6 (six) hours as needed for muscle spasms. 15 tablet 0   Multiple Vitamins-Minerals (CENTRUM SILVER  50+WOMEN PO) Take 1 tablet by mouth daily.     nitroGLYCERIN (NITROSTAT) 0.4 MG SL tablet Dissolve 1 tablet under the tongue every 5 minutes as needed for chest pain. Max of 3 doses, then 911. 25 tablet 6   pantoprazole (PROTONIX) 40 MG tablet Take 1 tablet (40 mg total) by mouth daily.     polyethylene glycol (MIRALAX / GLYCOLAX) 17 g packet Take 17 g by mouth daily as needed for mild constipation. 14 each 0   predniSONE (DELTASONE) 5 MG tablet Take 1 tablet (5 mg total) by mouth daily with breakfast. 30 tablet 0   pyrithione zinc (SELSUN BLUE DRY SCALP) 1 % shampoo Apply 1 application topically once a week.     RESTASIS 0.05 % ophthalmic emulsion Place 1 drop into both eyes 2 (two) times daily.     saccharomyces boulardii (FLORASTOR) 250 MG capsule Take 250  mg by mouth 2 (two) times daily.     sodium chloride 0.9 % infusion Inject 0-10 mLs into the vein as needed (for administration of IV medications (carrier fluid)).  0   traZODone (DESYREL) 50 MG tablet Take 0.5-1 tablets (25-50 mg total) by mouth at bedtime as needed for sleep. 15 tablet 0   vancomycin (VANCOCIN) 125 MG capsule Take 1 capsule (125 mg total) by mouth 2 (two) times daily. 64 capsule 0   No facility-administered medications prior to visit.     Allergies  Allergen Reactions   Cholestyramine     Possible- Makes throat burn. Patient said she can't take it    Compazine [Prochlorperazine Edisylate] Swelling    REACTION: " tongue swell and unable to swallow"   Sulfonamide Derivatives Other (See Comments)    REACTION: " broke out with fine itching bumps"   Hydrocodone Other (See Comments)   Infliximab Other (See Comments)    Other reaction(s): rash   Leflunomide Other (See Comments)    Other reaction(s): diarrhea   Prochlorperazine Other (See Comments)    Other reaction(s): Unknown  Other Reaction(s): tongue swells   Rosuvastatin     Other reaction(s): muscle aches   Sulfa Antibiotics Other (See Comments)    Other  reaction(s): tongue swelling    Social History   Tobacco Use   Smoking status: Never   Smokeless tobacco: Never  Vaping Use   Vaping Use: Never used  Substance Use Topics   Alcohol use: No    Alcohol/week: 0.0 standard drinks of alcohol   Drug use: No    Family History  Problem Relation Age of Onset   Heart failure Father    Heart attack Father    Diabetes Father    Heart attack Mother    Heart failure Mother    Diabetes Mother    Stroke Sister     Objective:  There were no vitals filed for this visit. There is no height or weight on file to calculate BMI.  Physical Exam Constitutional:      Appearance: Normal appearance.  HENT:     Head: Normocephalic and atraumatic.     Right Ear: Tympanic membrane normal.     Left Ear: Tympanic membrane normal.     Nose: Nose normal.     Mouth/Throat:     Mouth: Mucous membranes are moist.  Eyes:     Extraocular Movements: Extraocular movements intact.     Conjunctiva/sclera: Conjunctivae normal.     Pupils: Pupils are equal, round, and reactive to light.  Cardiovascular:     Rate and Rhythm: Normal rate and regular rhythm.     Heart sounds: No murmur heard.    No friction rub. No gallop.  Pulmonary:     Effort: Pulmonary effort is normal.     Breath sounds: Normal breath sounds.  Abdominal:     General: Abdomen is flat.     Palpations: Abdomen is soft.  Musculoskeletal:        General: Normal range of motion.  Skin:    General: Skin is warm and dry.  Neurological:     General: No focal deficit present.     Mental Status: She is alert and oriented to person, place, and time.  Psychiatric:        Mood and Affect: Mood normal.     Lab Results: Lab Results  Component Value Date   WBC 5.3 02/12/2022   HGB 8.1 (L) 02/12/2022   HCT 26.3 (  L) 02/12/2022   MCV 95.6 02/12/2022   PLT 217 02/12/2022    Lab Results  Component Value Date   CREATININE 1.17 (H) 02/14/2022   BUN 20 02/14/2022   NA 140 02/14/2022   K  4.2 02/14/2022   CL 111 02/14/2022   CO2 22 02/14/2022    Lab Results  Component Value Date   ALT 5 02/09/2022   AST 23 02/09/2022   ALKPHOS 37 (L) 02/09/2022   BILITOT 0.3 02/09/2022     Assessment & Plan:  #MSA bacteremia now SP PPM extraction(11/27) c/b possible vertebral OM -TEE, no veg -MRI showed no evidence of lumbar discitis/OM, previous T2 hyperintensity in L3-L4 and left psoas muscle improved.  -Pull picc, stop IV abx(completed cefazolin x 6 weeks from PPM extraction) -Continue Cefadroxil 1gm bid till 1/23(complete about 8 weeks of abx for possible vertebral OM), labs today -F/U in 2 weeks   #Left lower ext weakness -Referral NSY  #Medication montoring Wbc 7.3, scr 1.33 on 03/07/22    Laurice Record, MD [facility] [place]  I have personally spent 81 minutes involved in face-to-face and non-face-to-face activities for this patient on the day of the visit. Professional time spent includes the following activities: Preparing to see the patient (review of tests), Obtaining and/or reviewing separately obtained history (admission/discharge record), Performing a medically appropriate examination and/or evaluation , Ordering medications/tests/procedures, referring and communicating with other health care professionals, Documenting clinical information in the EMR, Independently interpreting results (not separately reported), Communicating results to the patient/family/caregiver, Counseling and educating the patient/family/caregiver and Care coordination (not separately reported).   03/12/22  9:05 AM

## 2022-03-13 DIAGNOSIS — T827XXA Infection and inflammatory reaction due to other cardiac and vascular devices, implants and grafts, initial encounter: Secondary | ICD-10-CM | POA: Diagnosis not present

## 2022-03-13 DIAGNOSIS — M4646 Discitis, unspecified, lumbar region: Secondary | ICD-10-CM | POA: Diagnosis not present

## 2022-03-13 DIAGNOSIS — K6812 Psoas muscle abscess: Secondary | ICD-10-CM | POA: Diagnosis not present

## 2022-03-13 DIAGNOSIS — A4101 Sepsis due to Methicillin susceptible Staphylococcus aureus: Secondary | ICD-10-CM | POA: Diagnosis not present

## 2022-03-13 DIAGNOSIS — E1169 Type 2 diabetes mellitus with other specified complication: Secondary | ICD-10-CM | POA: Diagnosis not present

## 2022-03-13 DIAGNOSIS — M4626 Osteomyelitis of vertebra, lumbar region: Secondary | ICD-10-CM | POA: Diagnosis not present

## 2022-03-13 LAB — COMPREHENSIVE METABOLIC PANEL
AG Ratio: 1.4 (calc) (ref 1.0–2.5)
ALT: <3 U/L — ABNORMAL LOW (ref 6–29)
AST: 13 U/L (ref 10–35)
Albumin: 3.9 g/dL (ref 3.6–5.1)
Alkaline phosphatase (APISO): 45 U/L (ref 37–153)
BUN/Creatinine Ratio: 31 (calc) — ABNORMAL HIGH (ref 6–22)
BUN: 46 mg/dL — ABNORMAL HIGH (ref 7–25)
CO2: 21 mmol/L (ref 20–32)
Calcium: 8.6 mg/dL (ref 8.6–10.4)
Chloride: 108 mmol/L (ref 98–110)
Creat: 1.48 mg/dL — ABNORMAL HIGH (ref 0.60–0.95)
Globulin: 2.7 g/dL (calc) (ref 1.9–3.7)
Glucose, Bld: 249 mg/dL — ABNORMAL HIGH (ref 65–99)
Potassium: 4.3 mmol/L (ref 3.5–5.3)
Sodium: 140 mmol/L (ref 135–146)
Total Bilirubin: 0.3 mg/dL (ref 0.2–1.2)
Total Protein: 6.6 g/dL (ref 6.1–8.1)

## 2022-03-13 LAB — CBC WITH DIFFERENTIAL/PLATELET
Absolute Monocytes: 513 cells/uL (ref 200–950)
Basophils Absolute: 49 cells/uL (ref 0–200)
Basophils Relative: 0.9 %
Eosinophils Absolute: 59 cells/uL (ref 15–500)
Eosinophils Relative: 1.1 %
HCT: 32.6 % — ABNORMAL LOW (ref 35.0–45.0)
Hemoglobin: 10.6 g/dL — ABNORMAL LOW (ref 11.7–15.5)
Lymphs Abs: 1210 cells/uL (ref 850–3900)
MCH: 29.9 pg (ref 27.0–33.0)
MCHC: 32.5 g/dL (ref 32.0–36.0)
MCV: 92.1 fL (ref 80.0–100.0)
MPV: 11.7 fL (ref 7.5–12.5)
Monocytes Relative: 9.5 %
Neutro Abs: 3569 cells/uL (ref 1500–7800)
Neutrophils Relative %: 66.1 %
Platelets: 220 10*3/uL (ref 140–400)
RBC: 3.54 10*6/uL — ABNORMAL LOW (ref 3.80–5.10)
RDW: 15.6 % — ABNORMAL HIGH (ref 11.0–15.0)
Total Lymphocyte: 22.4 %
WBC: 5.4 10*3/uL (ref 3.8–10.8)

## 2022-03-13 LAB — C-REACTIVE PROTEIN: CRP: 1.9 mg/L (ref <8.0)

## 2022-03-14 DIAGNOSIS — K6812 Psoas muscle abscess: Secondary | ICD-10-CM | POA: Diagnosis not present

## 2022-03-14 DIAGNOSIS — M4626 Osteomyelitis of vertebra, lumbar region: Secondary | ICD-10-CM | POA: Diagnosis not present

## 2022-03-14 DIAGNOSIS — M4646 Discitis, unspecified, lumbar region: Secondary | ICD-10-CM | POA: Diagnosis not present

## 2022-03-14 DIAGNOSIS — E1169 Type 2 diabetes mellitus with other specified complication: Secondary | ICD-10-CM | POA: Diagnosis not present

## 2022-03-14 DIAGNOSIS — T827XXA Infection and inflammatory reaction due to other cardiac and vascular devices, implants and grafts, initial encounter: Secondary | ICD-10-CM | POA: Diagnosis not present

## 2022-03-14 DIAGNOSIS — A4101 Sepsis due to Methicillin susceptible Staphylococcus aureus: Secondary | ICD-10-CM | POA: Diagnosis not present

## 2022-03-15 ENCOUNTER — Encounter: Payer: Self-pay | Admitting: Internal Medicine

## 2022-03-15 DIAGNOSIS — M4646 Discitis, unspecified, lumbar region: Secondary | ICD-10-CM | POA: Diagnosis not present

## 2022-03-15 DIAGNOSIS — K6812 Psoas muscle abscess: Secondary | ICD-10-CM | POA: Diagnosis not present

## 2022-03-15 DIAGNOSIS — T827XXA Infection and inflammatory reaction due to other cardiac and vascular devices, implants and grafts, initial encounter: Secondary | ICD-10-CM | POA: Diagnosis not present

## 2022-03-15 DIAGNOSIS — M4626 Osteomyelitis of vertebra, lumbar region: Secondary | ICD-10-CM | POA: Diagnosis not present

## 2022-03-15 DIAGNOSIS — E1169 Type 2 diabetes mellitus with other specified complication: Secondary | ICD-10-CM | POA: Diagnosis not present

## 2022-03-15 DIAGNOSIS — A4101 Sepsis due to Methicillin susceptible Staphylococcus aureus: Secondary | ICD-10-CM | POA: Diagnosis not present

## 2022-03-16 DIAGNOSIS — R051 Acute cough: Secondary | ICD-10-CM | POA: Diagnosis not present

## 2022-03-16 NOTE — Telephone Encounter (Signed)
Patient daughter Shelby Walker called - informed her of Dr.Singhs response labs are stable, if patient temperature is 100.4 or greater patient will need to be seen in office or go to ED if over the weekend. Shelby Walker voiced her understanding. Also gave number to [facility] to set up appointment for patient.   [place], CMA

## 2022-03-18 DIAGNOSIS — T827XXA Infection and inflammatory reaction due to other cardiac and vascular devices, implants and grafts, initial encounter: Secondary | ICD-10-CM | POA: Diagnosis not present

## 2022-03-18 DIAGNOSIS — E1122 Type 2 diabetes mellitus with diabetic chronic kidney disease: Secondary | ICD-10-CM | POA: Diagnosis not present

## 2022-03-18 DIAGNOSIS — M810 Age-related osteoporosis without current pathological fracture: Secondary | ICD-10-CM | POA: Diagnosis not present

## 2022-03-18 DIAGNOSIS — D631 Anemia in chronic kidney disease: Secondary | ICD-10-CM | POA: Diagnosis not present

## 2022-03-18 DIAGNOSIS — I051 Rheumatic mitral insufficiency: Secondary | ICD-10-CM | POA: Diagnosis not present

## 2022-03-18 DIAGNOSIS — K5900 Constipation, unspecified: Secondary | ICD-10-CM | POA: Diagnosis not present

## 2022-03-18 DIAGNOSIS — E1136 Type 2 diabetes mellitus with diabetic cataract: Secondary | ICD-10-CM | POA: Diagnosis not present

## 2022-03-18 DIAGNOSIS — E785 Hyperlipidemia, unspecified: Secondary | ICD-10-CM | POA: Diagnosis not present

## 2022-03-18 DIAGNOSIS — M4646 Discitis, unspecified, lumbar region: Secondary | ICD-10-CM | POA: Diagnosis not present

## 2022-03-18 DIAGNOSIS — I480 Paroxysmal atrial fibrillation: Secondary | ICD-10-CM | POA: Diagnosis not present

## 2022-03-18 DIAGNOSIS — I1310 Hypertensive heart and chronic kidney disease without heart failure, with stage 1 through stage 4 chronic kidney disease, or unspecified chronic kidney disease: Secondary | ICD-10-CM | POA: Diagnosis not present

## 2022-03-18 DIAGNOSIS — K6812 Psoas muscle abscess: Secondary | ICD-10-CM | POA: Diagnosis not present

## 2022-03-18 DIAGNOSIS — M069 Rheumatoid arthritis, unspecified: Secondary | ICD-10-CM | POA: Diagnosis not present

## 2022-03-18 DIAGNOSIS — N189 Chronic kidney disease, unspecified: Secondary | ICD-10-CM | POA: Diagnosis not present

## 2022-03-18 DIAGNOSIS — E1151 Type 2 diabetes mellitus with diabetic peripheral angiopathy without gangrene: Secondary | ICD-10-CM | POA: Diagnosis not present

## 2022-03-18 DIAGNOSIS — M4626 Osteomyelitis of vertebra, lumbar region: Secondary | ICD-10-CM | POA: Diagnosis not present

## 2022-03-18 DIAGNOSIS — E1169 Type 2 diabetes mellitus with other specified complication: Secondary | ICD-10-CM | POA: Diagnosis not present

## 2022-03-18 DIAGNOSIS — N12 Tubulo-interstitial nephritis, not specified as acute or chronic: Secondary | ICD-10-CM | POA: Diagnosis not present

## 2022-03-18 DIAGNOSIS — B029 Zoster without complications: Secondary | ICD-10-CM | POA: Diagnosis not present

## 2022-03-18 DIAGNOSIS — K219 Gastro-esophageal reflux disease without esophagitis: Secondary | ICD-10-CM | POA: Diagnosis not present

## 2022-03-18 DIAGNOSIS — I2510 Atherosclerotic heart disease of native coronary artery without angina pectoris: Secondary | ICD-10-CM | POA: Diagnosis not present

## 2022-03-18 DIAGNOSIS — A4101 Sepsis due to Methicillin susceptible Staphylococcus aureus: Secondary | ICD-10-CM | POA: Diagnosis not present

## 2022-03-18 DIAGNOSIS — H9190 Unspecified hearing loss, unspecified ear: Secondary | ICD-10-CM | POA: Diagnosis not present

## 2022-03-18 DIAGNOSIS — N179 Acute kidney failure, unspecified: Secondary | ICD-10-CM | POA: Diagnosis not present

## 2022-03-18 DIAGNOSIS — K5790 Diverticulosis of intestine, part unspecified, without perforation or abscess without bleeding: Secondary | ICD-10-CM | POA: Diagnosis not present

## 2022-03-19 DIAGNOSIS — M4626 Osteomyelitis of vertebra, lumbar region: Secondary | ICD-10-CM | POA: Diagnosis not present

## 2022-03-19 DIAGNOSIS — A4101 Sepsis due to Methicillin susceptible Staphylococcus aureus: Secondary | ICD-10-CM | POA: Diagnosis not present

## 2022-03-19 DIAGNOSIS — K6812 Psoas muscle abscess: Secondary | ICD-10-CM | POA: Diagnosis not present

## 2022-03-19 DIAGNOSIS — E1169 Type 2 diabetes mellitus with other specified complication: Secondary | ICD-10-CM | POA: Diagnosis not present

## 2022-03-19 DIAGNOSIS — M4646 Discitis, unspecified, lumbar region: Secondary | ICD-10-CM | POA: Diagnosis not present

## 2022-03-19 DIAGNOSIS — T827XXA Infection and inflammatory reaction due to other cardiac and vascular devices, implants and grafts, initial encounter: Secondary | ICD-10-CM | POA: Diagnosis not present

## 2022-03-20 DIAGNOSIS — A4101 Sepsis due to Methicillin susceptible Staphylococcus aureus: Secondary | ICD-10-CM | POA: Diagnosis not present

## 2022-03-20 DIAGNOSIS — M4646 Discitis, unspecified, lumbar region: Secondary | ICD-10-CM | POA: Diagnosis not present

## 2022-03-20 DIAGNOSIS — E1169 Type 2 diabetes mellitus with other specified complication: Secondary | ICD-10-CM | POA: Diagnosis not present

## 2022-03-20 DIAGNOSIS — M4626 Osteomyelitis of vertebra, lumbar region: Secondary | ICD-10-CM | POA: Diagnosis not present

## 2022-03-20 DIAGNOSIS — T827XXA Infection and inflammatory reaction due to other cardiac and vascular devices, implants and grafts, initial encounter: Secondary | ICD-10-CM | POA: Diagnosis not present

## 2022-03-20 DIAGNOSIS — K6812 Psoas muscle abscess: Secondary | ICD-10-CM | POA: Diagnosis not present

## 2022-03-22 NOTE — Telephone Encounter (Signed)
Please get auth. Thank you

## 2022-03-23 ENCOUNTER — Telehealth: Payer: Self-pay

## 2022-03-23 NOTE — Telephone Encounter (Signed)
VOB submitted for Monovisc, left knee 

## 2022-03-23 NOTE — Telephone Encounter (Signed)
Noted  

## 2022-03-26 ENCOUNTER — Encounter: Payer: Medicare Other | Admitting: Internal Medicine

## 2022-03-26 DIAGNOSIS — M4626 Osteomyelitis of vertebra, lumbar region: Secondary | ICD-10-CM | POA: Diagnosis not present

## 2022-03-26 DIAGNOSIS — T827XXA Infection and inflammatory reaction due to other cardiac and vascular devices, implants and grafts, initial encounter: Secondary | ICD-10-CM | POA: Diagnosis not present

## 2022-03-26 DIAGNOSIS — E1169 Type 2 diabetes mellitus with other specified complication: Secondary | ICD-10-CM | POA: Diagnosis not present

## 2022-03-26 DIAGNOSIS — A4101 Sepsis due to Methicillin susceptible Staphylococcus aureus: Secondary | ICD-10-CM | POA: Diagnosis not present

## 2022-03-26 DIAGNOSIS — K6812 Psoas muscle abscess: Secondary | ICD-10-CM | POA: Diagnosis not present

## 2022-03-26 DIAGNOSIS — M4646 Discitis, unspecified, lumbar region: Secondary | ICD-10-CM | POA: Diagnosis not present

## 2022-03-27 ENCOUNTER — Other Ambulatory Visit: Payer: Self-pay

## 2022-03-27 ENCOUNTER — Encounter: Payer: Self-pay | Admitting: Internal Medicine

## 2022-03-27 ENCOUNTER — Ambulatory Visit (INDEPENDENT_AMBULATORY_CARE_PROVIDER_SITE_OTHER): Payer: Medicare Other | Admitting: Internal Medicine

## 2022-03-27 ENCOUNTER — Telehealth: Payer: Self-pay

## 2022-03-27 VITALS — BP 139/73 | HR 81 | Temp 97.4°F | Ht 61.5 in | Wt 112.0 lb

## 2022-03-27 DIAGNOSIS — B9561 Methicillin susceptible Staphylococcus aureus infection as the cause of diseases classified elsewhere: Secondary | ICD-10-CM

## 2022-03-27 DIAGNOSIS — R7881 Bacteremia: Secondary | ICD-10-CM

## 2022-03-27 NOTE — Progress Notes (Signed)
Patient Active Problem List   Diagnosis Date Noted   Coronary artery disease involving native coronary artery of native heart without angina pectoris 02/21/2022   Discitis of lumbar region 02/05/2022   Herpes zoster without complication 35/36/1443   Clostridium difficile colitis 02/05/2022   Severe sepsis with acute organ dysfunction due to Gram positive bacteria ([place]) 02/02/2022   Pacemaker infection ([place]) 02/01/2022   Periostitis of lumbar spine without osteomyelitis ([place]) 02/01/2022   Acute midline low back pain without sciatica 01/26/2022   MSSA bacteremia 01/25/2022   Presence of cardiac pacemaker 01/25/2022   Severe sepsis with septic shock ([place]) 01/24/2022   AKI (acute kidney injury) ([place]) 01/24/2022   Sinus pause 01/04/2022   Acute viral bronchitis 02/06/2021   Acute blood loss anemia    Postoperative pain    S/P transmetatarsal amputation of foot, right ([place]) 01/24/2021   Protein-calorie malnutrition, severe 01/17/2021   Diabetic foot infection ([place]) 01/16/2021   Rheumatoid arthritis ([place]) 01/16/2021   Stage 3b chronic kidney disease (CKD) ([place]) 01/16/2021   DNR (do not resuscitate) 01/16/2021   Decreased anal sphincter tone 12/13/2018   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   Irritable bowel syndrome with diarrhea - post infectious 12/12/2018   Full incontinence of feces and fecal smearing 12/12/2018   Perianal dermatitis 12/12/2018   Uncontrolled type 2 diabetes mellitus with hyperglycemia ([place]) 10/26/2018   Long-term use of immunosuppressant medication-Enbrel 10/26/2018   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 08/27/2018   Thrombocytopenia (HCC)    Essential hypertension    Dyspnea on exertion    Steroid-induced hyperglycemia    Fatigue 08/06/2018   Paroxysmal atrial fibrillation (HCC)    Mixed hyperlipidemia 09/29/2016   Stable angina 09/29/2016   PAD (peripheral artery disease) ([place]) 08/09/2016   Preoperative clearance 05/21/2012   Precordial  chest pain    Palpitations    Diabetes mellitus (HCC)    GERD (gastroesophageal reflux disease)    Ejection fraction    Carotid artery disease (HCC)    Mitral regurgitation 04/21/2009   PSORIASIS 02/09/2009   Rheumatoid arthritis flare ([place]) 02/09/2009    Patient's Medications  New Prescriptions   No medications on file  Previous Medications   ACETAMINOPHEN (TYLENOL) 325 MG TABLET    Take 1 tablet (325 mg total) by mouth every 6 (six) hours.   AMIODARONE (PACERONE) 200 MG TABLET    Take 1 tablet (200 mg total) by mouth daily.   ATORVASTATIN (LIPITOR) 20 MG TABLET    Take 1 tablet (20 mg total) by mouth daily.   CALCIUM CARBONATE-VITAMIN D 600-400 MG-UNIT TABLET    Take 1 tablet by mouth daily at 12 noon.    CEFADROXIL (DURICEF) 500 MG CAPSULE    Take 1 capsule (500 mg total) by mouth 2 (two) times daily.   CEFAZOLIN (ANCEF) 2-4 GM/100ML-% IVPB    Inject 100 mLs (2 g total) into the vein 2 (two) times daily.   CHLORHEXIDINE GLUCONATE CLOTH 2 % PADS    Apply 6 each topically every 12 (twelve) hours.   DICLOFENAC SODIUM (VOLTAREN) 1 % GEL    Apply 4 g topically 4 (four) times daily.   FEEDING SUPPLEMENT (ENSURE ENLIVE / ENSURE PLUS) LIQD    Take 237 mLs by mouth 2 (two) times daily with a meal.   GABAPENTIN (NEURONTIN) 100 MG CAPSULE    Take 1 capsule (100 mg total) by mouth 3 (three) times daily.   INSULIN ASPART (NOVOLOG) 100 UNIT/ML  FLEXPEN    Inject 2 Units into the skin 3 (three) times daily with meals. Sliding scale   INSULIN DEGLUDEC (TRESIBA) 100 UNIT/ML SOLN    Inject 4 Units into the skin at bedtime.   LIDOCAINE (LIDODERM) 5 %    Place 1 patch onto the skin daily. Remove & Discard patch within 12 hours or as directed by MD   MEGESTROL (MEGACE) 400 MG/10ML SUSPENSION    Take 10 mLs (400 mg total) by mouth 2 (two) times daily.   METHOCARBAMOL (ROBAXIN) 500 MG TABLET    Take 1 tablet (500 mg total) by mouth every 6 (six) hours as needed for muscle spasms.   MULTIPLE  VITAMINS-MINERALS (CENTRUM SILVER 50+WOMEN PO)    Take 1 tablet by mouth daily.   NITROGLYCERIN (NITROSTAT) 0.4 MG SL TABLET    Dissolve 1 tablet under the tongue every 5 minutes as needed for chest pain. Max of 3 doses, then 911.   NYSTATIN (MYCOSTATIN) 100000 UNIT/ML SUSPENSION       PANTOPRAZOLE (PROTONIX) 40 MG TABLET    Take 1 tablet (40 mg total) by mouth daily.   POLYETHYLENE GLYCOL (MIRALAX / GLYCOLAX) 17 G PACKET    Take 17 g by mouth daily as needed for mild constipation.   PREDNISONE (DELTASONE) 5 MG TABLET    Take 1 tablet (5 mg total) by mouth daily with breakfast.   PYRITHIONE ZINC (SELSUN BLUE DRY SCALP) 1 % SHAMPOO    Apply 1 application topically once a week.   RESTASIS 0.05 % OPHTHALMIC EMULSION    Place 1 drop into both eyes 2 (two) times daily.   SACCHAROMYCES BOULARDII (FLORASTOR) 250 MG CAPSULE    Take 250 mg by mouth 2 (two) times daily.   SODIUM CHLORIDE 0.9 % INFUSION    Inject 0-10 mLs into the vein as needed (for administration of IV medications (carrier fluid)).   TRAZODONE (DESYREL) 50 MG TABLET    Take 0.5-1 tablets (25-50 mg total) by mouth at bedtime as needed for sleep.   VANCOMYCIN (VANCOCIN) 125 MG CAPSULE    Take 1 capsule (125 mg total) by mouth 2 (two) times daily.  Modified Medications   No medications on file  Discontinued Medications   No medications on file    Subjective: Shelby Walker is a 89 y.o. F with PMHx of diabetes, history of right TMA, CKD stage III, sinus arrest status post PPM 11/3 presents for hospital follow-up of MSSA bacteremia discharged cefazolin x 6 weeks EOT 03/11/2022.  MSSA bacteremia suspected secondary to PPM infection status post removal, complicated by possible discitis/vertebral osteomyelitis and psoas muscle infection. -11/22 blood Cx 2/2 MSSA -11/23 blood Cx NG -11/27 PPM removed 03/12/22: Pt reports feeling well, denies fevers and chills. Reports LLE weakness that feels worse.  Today: Denies fevers and chills, Pt had temps  around 99 after PICC removal that have now resolved.   Review of Systems      Review of Systems: Review of Systems  All other systems reviewed and are negative.   Past Medical History:  Diagnosis Date   Bruit    Carotid Doppler showed no significant abnormality     9per patient)   C. difficile colitis 07/2018   with severe sepsis   Chest pain, unspecified    Nuclear, May, 2008, no scar or ischemia   Diabetes mellitus    Diverticulosis    Dyslipidemia    Ejection fraction    EF 55-60%, echo, February, 2011 // Echocardiogram  8/21: EF 60-65, no RWMA, Gr 1 DD, GLS -14%, normal RVSF, mild LAE, trivial MR, mild MS (mean 4 mmHg), RVSP 23.4    GERD (gastroesophageal reflux disease)    Mitral regurgitation 04/21/2009   mild,  echo, February, 2011   Osteoporosis    Palpitations    possible very brief atrial fibrillation on monitor and possible reentrant tachycardia   Psoriasis    Rheumatoid arthritis ([place])     Social History   Tobacco Use   Smoking status: Never   Smokeless tobacco: Never  Vaping Use   Vaping Use: Never used  Substance Use Topics   Alcohol use: No    Alcohol/week: 0.0 standard drinks of alcohol   Drug use: No    Family History  Problem Relation Age of Onset   Heart failure Father    Heart attack Father    Diabetes Father    Heart attack Mother    Heart failure Mother    Diabetes Mother    Stroke Sister     Allergies  Allergen Reactions   Cholestyramine     Possible- Makes throat burn. Patient said she can't take it    Compazine [Prochlorperazine Edisylate] Swelling    REACTION: " tongue swell and unable to swallow"   Sulfonamide Derivatives Other (See Comments)    REACTION: " broke out with fine itching bumps"   Hydrocodone Other (See Comments)   Infliximab Other (See Comments)    Other reaction(s): rash   Leflunomide Other (See Comments)    Other reaction(s): diarrhea   Prochlorperazine Other (See Comments)    Other reaction(s):  Unknown  Other Reaction(s): tongue swells   Rosuvastatin     Other reaction(s): muscle aches   Sulfa Antibiotics Other (See Comments)    Other reaction(s): tongue swelling    Health Maintenance  Topic Date Due   COVID-19 Vaccine (1) Never done   FOOT EXAM  Never done   OPHTHALMOLOGY EXAM  Never done   Zoster Vaccines- Shingrix (1 of 2) Never done   Pneumonia Vaccine 71+ Years old (1 - PCV) Never done   DEXA SCAN  Never done   Medicare Annual Wellness (AWV)  02/02/2020   INFLUENZA VACCINE  10/03/2021   HEMOGLOBIN A1C  07/25/2022   DTaP/Tdap/Td (2 - Td or Tdap) 05/08/2030   HPV VACCINES  Aged Out    Objective:  There were no vitals filed for this visit. There is no height or weight on file to calculate BMI.  Physical Exam Constitutional:      Appearance: Normal appearance.  HENT:     Head: Normocephalic and atraumatic.     Right Ear: Tympanic membrane normal.     Left Ear: Tympanic membrane normal.     Nose: Nose normal.     Mouth/Throat:     Mouth: Mucous membranes are moist.  Eyes:     Extraocular Movements: Extraocular movements intact.     Conjunctiva/sclera: Conjunctivae normal.     Pupils: Pupils are equal, round, and reactive to light.  Cardiovascular:     Rate and Rhythm: Normal rate and regular rhythm.     Heart sounds: No murmur heard.    No friction rub. No gallop.  Pulmonary:     Effort: Pulmonary effort is normal.     Breath sounds: Normal breath sounds.  Abdominal:     General: Abdomen is flat.     Palpations: Abdomen is soft.  Musculoskeletal:        General: Normal range  of motion.  Skin:    General: Skin is warm and dry.  Neurological:     General: No focal deficit present.     Mental Status: She is alert and oriented to person, place, and time.  Psychiatric:        Mood and Affect: Mood normal.     Lab Results Lab Results  Component Value Date   WBC 5.4 03/12/2022   HGB 10.6 (L) 03/12/2022   HCT 32.6 (L) 03/12/2022   MCV 92.1  03/12/2022   PLT 220 03/12/2022    Lab Results  Component Value Date   CREATININE 1.48 (H) 03/12/2022   BUN 46 (H) 03/12/2022   NA 140 03/12/2022   K 4.3 03/12/2022   CL 108 03/12/2022   CO2 21 03/12/2022    Lab Results  Component Value Date   ALT <3 (L) 03/12/2022   AST 13 03/12/2022   ALKPHOS 37 (L) 02/09/2022   BILITOT 0.3 03/12/2022    Lab Results  Component Value Date   CHOL 149 01/05/2022   HDL 50 01/05/2022   LDLCALC 71 01/05/2022   TRIG 138 01/05/2022   CHOLHDL 3.0 01/05/2022   No results found for: "LABRPR", "RPRTITER" No results found for: "HIV1RNAQUANT", "HIV1RNAVL", "CD4TABS"  Assessment & Plan:  #MSA bacteremia now SP PPM extraction(11/27) c/b possible vertebral OM -TEE, no veg -MRI on 13/24 showed no evidence of lumbar discitis/OM, previous T2 hyperintensity in L3-L4 and left psoas muscle improved.  Plan -Labs today. Last set of labs stable -Stop cefadroxil today. Continue Vnaomcyinc x 7 days from today for cdiff ppx.  -F/U PRN   Laurice Record, MD [facility] 03/27/2022, 5:50 AM

## 2022-03-27 NOTE — Telephone Encounter (Signed)
Patient daughter called to ask Dr.Singh when patient could start back rheumatoid arthritis medication since patient was supposed to stop immunosuppressant medications while taking antibiotics. Per Dr.Singh - compete Vancomycin then follow up with rheumatologist regarding rheumatoid arthritis medication.    [place], CMA

## 2022-03-28 DIAGNOSIS — A4101 Sepsis due to Methicillin susceptible Staphylococcus aureus: Secondary | ICD-10-CM | POA: Diagnosis not present

## 2022-03-28 DIAGNOSIS — M4626 Osteomyelitis of vertebra, lumbar region: Secondary | ICD-10-CM | POA: Diagnosis not present

## 2022-03-28 DIAGNOSIS — K6812 Psoas muscle abscess: Secondary | ICD-10-CM | POA: Diagnosis not present

## 2022-03-28 DIAGNOSIS — T827XXA Infection and inflammatory reaction due to other cardiac and vascular devices, implants and grafts, initial encounter: Secondary | ICD-10-CM | POA: Diagnosis not present

## 2022-03-28 DIAGNOSIS — E1169 Type 2 diabetes mellitus with other specified complication: Secondary | ICD-10-CM | POA: Diagnosis not present

## 2022-03-28 DIAGNOSIS — M4646 Discitis, unspecified, lumbar region: Secondary | ICD-10-CM | POA: Diagnosis not present

## 2022-03-28 LAB — CBC WITH DIFFERENTIAL/PLATELET
Absolute Monocytes: 635 cells/uL (ref 200–950)
Basophils Absolute: 61 cells/uL (ref 0–200)
Basophils Relative: 1.3 %
Eosinophils Absolute: 141 cells/uL (ref 15–500)
Eosinophils Relative: 3 %
HCT: 32 % — ABNORMAL LOW (ref 35.0–45.0)
Hemoglobin: 10.5 g/dL — ABNORMAL LOW (ref 11.7–15.5)
Lymphs Abs: 1457 cells/uL (ref 850–3900)
MCH: 30 pg (ref 27.0–33.0)
MCHC: 32.8 g/dL (ref 32.0–36.0)
MCV: 91.4 fL (ref 80.0–100.0)
MPV: 11.1 fL (ref 7.5–12.5)
Monocytes Relative: 13.5 %
Neutro Abs: 2406 cells/uL (ref 1500–7800)
Neutrophils Relative %: 51.2 %
Platelets: 218 10*3/uL (ref 140–400)
RBC: 3.5 10*6/uL — ABNORMAL LOW (ref 3.80–5.10)
RDW: 15.6 % — ABNORMAL HIGH (ref 11.0–15.0)
Total Lymphocyte: 31 %
WBC: 4.7 10*3/uL (ref 3.8–10.8)

## 2022-03-28 LAB — COMPREHENSIVE METABOLIC PANEL
AG Ratio: 1.5 (calc) (ref 1.0–2.5)
ALT: 29 U/L (ref 6–29)
AST: 29 U/L (ref 10–35)
Albumin: 3.7 g/dL (ref 3.6–5.1)
Alkaline phosphatase (APISO): 52 U/L (ref 37–153)
BUN/Creatinine Ratio: 31 (calc) — ABNORMAL HIGH (ref 6–22)
BUN: 41 mg/dL — ABNORMAL HIGH (ref 7–25)
CO2: 23 mmol/L (ref 20–32)
Calcium: 8.4 mg/dL — ABNORMAL LOW (ref 8.6–10.4)
Chloride: 111 mmol/L — ABNORMAL HIGH (ref 98–110)
Creat: 1.32 mg/dL — ABNORMAL HIGH (ref 0.60–0.95)
Globulin: 2.4 g/dL (calc) (ref 1.9–3.7)
Glucose, Bld: 162 mg/dL — ABNORMAL HIGH (ref 65–99)
Potassium: 4.7 mmol/L (ref 3.5–5.3)
Sodium: 142 mmol/L (ref 135–146)
Total Bilirubin: 0.5 mg/dL (ref 0.2–1.2)
Total Protein: 6.1 g/dL (ref 6.1–8.1)

## 2022-03-28 LAB — SEDIMENTATION RATE: Sed Rate: 22 mm/h (ref 0–30)

## 2022-03-28 LAB — C-REACTIVE PROTEIN: CRP: 4.6 mg/L (ref <8.0)

## 2022-03-29 ENCOUNTER — Other Ambulatory Visit: Payer: Self-pay

## 2022-03-29 DIAGNOSIS — M1712 Unilateral primary osteoarthritis, left knee: Secondary | ICD-10-CM

## 2022-04-02 ENCOUNTER — Encounter: Payer: Self-pay | Admitting: Internal Medicine

## 2022-04-02 ENCOUNTER — Ambulatory Visit: Payer: Medicare Other | Attending: Internal Medicine | Admitting: Internal Medicine

## 2022-04-02 VITALS — BP 146/62 | HR 77 | Ht 61.5 in | Wt 114.0 lb

## 2022-04-02 DIAGNOSIS — I4819 Other persistent atrial fibrillation: Secondary | ICD-10-CM

## 2022-04-02 MED ORDER — AMIODARONE HCL 200 MG PO TABS: 100.0000 mg | ORAL_TABLET | Freq: Every day | ORAL | 11 refills | Status: DC

## 2022-04-02 NOTE — Progress Notes (Signed)
Patient Care Team: Reynold Bowen, MD as PCP - General (Endocrinology) Nahser, Wonda Cheng, MD as PCP - Cardiology (Cardiology) Deboraha Sprang, MD as PCP - Electrophysiology (Clinical Cardiac Electrophysiology)   HPI  Shelby Walker is a 89 y.o. female Seen following to loop recorder insertion to monitor for ongoing afib following an episode following C diff colitis anticoagulation stopped  Follow-up monitoring for atrial fibrillation she had a 25-second sinus pause and underwent by Dr. Elliot Cousin 11/23 explantation of the loop recorder implantation of a pacemaker  Just weeks later she presented with sepsis MSSA bacteremia and explantation of the previously implanted device.  Imaging also suggested periostitis of the lumbar spine without osteomyelitis.  She was treated with antibiotics as well as vancomycin prophylaxis for C. difficile colitis  Following hospital, she had  tachycardia, amiodarone was started.  Remains weak as a dishrag although improvement over the weeks is notable.    DATE TEST EF    10/17 Myoview  77 %    5/20 Echo  60-65 %    8/21 Echo 60-65%                 Date Cr K Hgb  12/22 1.25 4.2 8.3              Records and Results Reviewed   Past Medical History:  Diagnosis Date   Bruit    Carotid Doppler showed no significant abnormality     9per patient)   C. difficile colitis 07/2018   with severe sepsis   Chest pain, unspecified    Nuclear, May, 2008, no scar or ischemia   Diabetes mellitus    Diverticulosis    Dyslipidemia    Ejection fraction    EF 55-60%, echo, February, 2011 // Echocardiogram 8/21: EF 60-65, no RWMA, Gr 1 DD, GLS -14%, normal RVSF, mild LAE, trivial MR, mild MS (mean 4 mmHg), RVSP 23.4    GERD (gastroesophageal reflux disease)    Mitral regurgitation 04/21/2009   mild,  echo, February, 2011   Osteoporosis    Palpitations    possible very brief atrial fibrillation on monitor and possible reentrant tachycardia   Psoriasis     Rheumatoid arthritis ([place])     Past Surgical History:  Procedure Laterality Date   ABDOMINAL AORTOGRAM W/LOWER EXTREMITY Right 10/19/2020   Procedure: ABDOMINAL AORTOGRAM W/LOWER EXTREMITY;  Surgeon: Wellington Hampshire, MD;  Location: [place] [facility];  Service: Cardiovascular;  Laterality: Right;   FRACTURE SURGERY     LOOP RECORDER REMOVAL N/A 01/05/2022   Procedure: LOOP RECORDER REMOVAL;  Surgeon: Evans Lance, MD;  Location: [place] [facility];  Service: Cardiovascular;  Laterality: N/A;   PACEMAKER IMPLANT N/A 01/05/2022   Procedure: PACEMAKER IMPLANT;  Surgeon: Evans Lance, MD;  Location: [place] [facility];  Service: Cardiovascular;  Laterality: N/A;   PPM GENERATOR REMOVAL N/A 01/29/2022   Procedure: PPM GENERATOR REMOVAL;  Surgeon: Evans Lance, MD;  Location: [place] [facility];  Service: Cardiovascular;  Laterality: N/A;   TEE WITHOUT CARDIOVERSION N/A 01/29/2022   Procedure: TRANSESOPHAGEAL ECHOCARDIOGRAM (TEE);  Surgeon: Geralynn Rile, MD;  Location: [place];  Service: Cardiovascular;  Laterality: N/A;   TRANSMETATARSAL AMPUTATION Right 01/18/2021   Procedure: TRANSMETATARSAL AMPUTATION;  Surgeon: Wylene Simmer, MD;  Location: [place];  Service: Orthopedics;  Laterality: Right;    Current Meds  Medication Sig   acetaminophen (TYLENOL) 325 MG tablet Take 1 tablet (325 mg total)  by mouth every 6 (six) hours.   amiodarone (PACERONE) 200 MG tablet Take 1 tablet (200 mg total) by mouth daily.   atorvastatin (LIPITOR) 20 MG tablet Take 1 tablet (20 mg total) by mouth daily.   Calcium Carbonate-Vitamin D 600-400 MG-UNIT tablet Take 1 tablet by mouth daily at 12 noon.    cefadroxil (DURICEF) 500 MG capsule Take 1 capsule (500 mg total) by mouth 2 (two) times daily.   diclofenac Sodium (VOLTAREN) 1 % GEL Apply 4 g topically 4 (four) times daily.   feeding supplement (ENSURE ENLIVE / ENSURE PLUS) LIQD Take 237 mLs by mouth 2 (two) times daily with a meal.    gabapentin (NEURONTIN) 100 MG capsule Take 1 capsule (100 mg total) by mouth 3 (three) times daily.   insulin aspart (NOVOLOG) 100 UNIT/ML FlexPen Inject 2 Units into the skin 3 (three) times daily with meals. Sliding scale   Insulin Degludec (TRESIBA) 100 UNIT/ML SOLN Inject 4 Units into the skin at bedtime.   lidocaine (LIDODERM) 5 % Place 1 patch onto the skin daily. Remove & Discard patch within 12 hours or as directed by MD   megestrol (MEGACE) 400 MG/10ML suspension Take 10 mLs (400 mg total) by mouth 2 (two) times daily.   methocarbamol (ROBAXIN) 500 MG tablet Take 1 tablet (500 mg total) by mouth every 6 (six) hours as needed for muscle spasms.   Multiple Vitamins-Minerals (CENTRUM SILVER 50+WOMEN PO) Take 1 tablet by mouth daily.   nitroGLYCERIN (NITROSTAT) 0.4 MG SL tablet Dissolve 1 tablet under the tongue every 5 minutes as needed for chest pain. Max of 3 doses, then 911.   nystatin (MYCOSTATIN) 100000 UNIT/ML suspension    pantoprazole (PROTONIX) 40 MG tablet Take 1 tablet (40 mg total) by mouth daily.   polyethylene glycol (MIRALAX / GLYCOLAX) 17 g packet Take 17 g by mouth daily as needed for mild constipation.   predniSONE (DELTASONE) 5 MG tablet Take 1 tablet (5 mg total) by mouth daily with breakfast.   pyrithione zinc (SELSUN BLUE DRY SCALP) 1 % shampoo Apply 1 application topically once a week.   RESTASIS 0.05 % ophthalmic emulsion Place 1 drop into both eyes 2 (two) times daily.   saccharomyces boulardii (FLORASTOR) 250 MG capsule Take 250 mg by mouth 2 (two) times daily.   sodium chloride 0.9 % infusion Inject 0-10 mLs into the vein as needed (for administration of IV medications (carrier fluid)).   traZODone (DESYREL) 50 MG tablet Take 0.5-1 tablets (25-50 mg total) by mouth at bedtime as needed for sleep.   vancomycin (VANCOCIN) 125 MG capsule Take 1 capsule (125 mg total) by mouth 2 (two) times daily.    Allergies  Allergen Reactions   Cholestyramine     Possible-  Makes throat burn. Patient said she can't take it    Compazine [Prochlorperazine Edisylate] Swelling    REACTION: " tongue swell and unable to swallow"   Sulfonamide Derivatives Other (See Comments)    REACTION: " broke out with fine itching bumps"   Hydrocodone Other (See Comments)   Infliximab Other (See Comments)    Other reaction(s): rash   Leflunomide Other (See Comments)    Other reaction(s): diarrhea   Prochlorperazine Other (See Comments)    Other reaction(s): Unknown  Other Reaction(s): tongue swells   Rosuvastatin     Other reaction(s): muscle aches   Sulfa Antibiotics Other (See Comments)    Other reaction(s): tongue swelling      Review of Systems negative  except from HPI and PMH  Physical Exam BP (!) 146/62   Pulse 77   Ht 5' 1.5" (1.562 m)   Wt 114 lb (51.7 kg)   SpO2 97%   BMI 21.19 kg/m  Well developed and well nourished in no acute distress HENT normal Neck supple with JVP-flat Clear Device pocket well healed; without hematoma or erythema.  There is no tethering  Regular rate and rhythm, no murmur Abd-soft with active BS No Clubbing cyanosis   edema  arthritic joints  Skin-warm and dry A & Oriented  Grossly normal sensory and motor function  ECG       Assessment and  Plan Atrial fibrillation-persistent   Fall    Peripheral vascular disease with prior revascularization  Sinus pause 25 seconds  Atrial tachycardia noted in the hospital  Recorder insertion and removal, pacemaker implantation and removal secondary to MSSA bacteremia   Fluid status is stable.  Blood pressure stable.  The big issue is what to do about the 25-second pause.  A brief literature review identifies sinus pauses as associated with COVID infection.  The papers that I saw however had pauses up to 6 seconds but not longer.  However, the patient's were mostly in their 71s.  So it is hard to know what to make of this an 89 year old woman.  Without a history of prior  syncope, is also hard to be dogmatic about insertion of pacemaker implantation particularly in light of the fact that she does not want to have anything done.  We discussed both leadless pacemaker as well as implantation of a loop recorder looking for both pauses as well as tachycardia.  In relation to the latter, we will decrease her amiodarone from 200--100 mg a day and following loop recorder insertion we will discontinue it.  She is agreeable to loop recorder implantation    Current medicines are reviewed at length with the patient today .  The patient does not   have concerns regarding medicines.

## 2022-04-02 NOTE — Patient Instructions (Signed)
Medication Instructions:  Your physician has recommended you make the following change in your medication:   ** Decrease Amiodarone '200mg'$  to 1/2 tablet ('100mg'$ ) by mouth daily.  *If you need a refill on your cardiac medications before your next appointment, please call your pharmacy*   Lab Work: None ordered.  If you have labs (blood work) drawn today and your tests are completely normal, you will receive your results only by: [place] (if you have MyChart) OR A paper copy in the mail If you have any lab test that is abnormal or we need to change your treatment, we will call you to review the results.   Testing/Procedures: Dr Caryl Comes would like for you to consider an Implanted Loop Recorder.   Follow-Up: At [facility], you and your health needs are our priority.  As part of our continuing mission to provide you with exceptional heart care, we have created designated Provider Care Teams.  These Care Teams include your primary Cardiologist (physician) and Advanced Practice Providers (APPs -  Physician Assistants and Nurse Practitioners) who all work together to provide you with the care you need, when you need it.  We recommend signing up for the patient portal called "MyChart".  Sign up information is provided on this After Visit Summary.  MyChart is used to connect with patients for Virtual Visits (Telemedicine).  Patients are able to view lab/test results, encounter notes, upcoming appointments, etc.  Non-urgent messages can be sent to your provider as well.   To learn more about what you can do with MyChart, go to [email or web address].    Your next appointment:   To be scheduled

## 2022-04-03 DIAGNOSIS — Z89431 Acquired absence of right foot: Secondary | ICD-10-CM | POA: Diagnosis not present

## 2022-04-03 DIAGNOSIS — L84 Corns and callosities: Secondary | ICD-10-CM | POA: Diagnosis not present

## 2022-04-05 DIAGNOSIS — T827XXA Infection and inflammatory reaction due to other cardiac and vascular devices, implants and grafts, initial encounter: Secondary | ICD-10-CM | POA: Diagnosis not present

## 2022-04-05 DIAGNOSIS — E1169 Type 2 diabetes mellitus with other specified complication: Secondary | ICD-10-CM | POA: Diagnosis not present

## 2022-04-05 DIAGNOSIS — M4626 Osteomyelitis of vertebra, lumbar region: Secondary | ICD-10-CM | POA: Diagnosis not present

## 2022-04-05 DIAGNOSIS — A4101 Sepsis due to Methicillin susceptible Staphylococcus aureus: Secondary | ICD-10-CM | POA: Diagnosis not present

## 2022-04-05 DIAGNOSIS — K6812 Psoas muscle abscess: Secondary | ICD-10-CM | POA: Diagnosis not present

## 2022-04-05 DIAGNOSIS — M4646 Discitis, unspecified, lumbar region: Secondary | ICD-10-CM | POA: Diagnosis not present

## 2022-04-09 ENCOUNTER — Ambulatory Visit (INDEPENDENT_AMBULATORY_CARE_PROVIDER_SITE_OTHER): Payer: Medicare Other | Admitting: Physician Assistant

## 2022-04-09 ENCOUNTER — Encounter: Payer: Self-pay | Admitting: Physician Assistant

## 2022-04-09 DIAGNOSIS — M1712 Unilateral primary osteoarthritis, left knee: Secondary | ICD-10-CM | POA: Diagnosis not present

## 2022-04-09 MED ORDER — HYALURONAN 88 MG/4ML IX SOSY
88.0000 mg | PREFILLED_SYRINGE | INTRA_ARTICULAR | Status: AC | PRN
  Administered 2022-04-09: 88 mg via INTRA_ARTICULAR

## 2022-04-09 NOTE — Progress Notes (Signed)
   Procedure Note  Patient: Shelby Walker             Date of Birth: 1933/06/30           MRN: [id number]             Visit Date: 04/09/2022 This patient is diagnosed with osteoarthritis of the left knee.   MRI left knee dated 11/03/2021 is reviewed with the patient.  This shows patellofemoral arthritis involving the patellar apex and medial facet.  Medial lateral compartment with some mild degenerative changes.  No meniscal tear no acute fractures bony abnormalities otherwise.  Degenerative changes of the medial meniscus without tear.   She has experienced inadequate response, adverse effects and/or intolerance with conservative treatments such as acetaminophen, NSAIDS, topical creams, physical therapy or regular exercise, knee bracing and/or weight loss.  She had inadequate response  to intra articular steroid injections for at least 3 months.   This patient is not scheduled to have a total knee replacement within 6 months of starting treatment with viscosupplementation.   Procedures: Visit Diagnoses:  1. Primary osteoarthritis of left knee     Large Joint Inj: L knee on 04/09/2022 11:03 AM Indications: pain Details: 22 G 1.5 in needle, anterolateral approach  Arthrogram: No  Medications: 88 mg Hyaluronan 88 MG/4ML Outcome: tolerated well, no immediate complications Procedure, treatment alternatives, risks and benefits explained, specific risks discussed. Consent was given by the patient. Immediately prior to procedure a time out was called to verify the correct patient, procedure, equipment, support staff and site/side marked as required. Patient was prepped and draped in the usual sterile fashion.     She will follow-up on an as-needed basis.  Understands to wait at least 6 months between injections with viscosupplementation.  Questions were encouraged and answered

## 2022-04-10 ENCOUNTER — Telehealth: Payer: Self-pay

## 2022-04-10 ENCOUNTER — Encounter: Payer: Medicare Other | Admitting: Internal Medicine

## 2022-04-10 NOTE — Telephone Encounter (Signed)
Patient's daughter called asking about referral to neurosurgery that was denied due to dx code MSSA bacteriemia. Neurosurgery does not see patients for this diagnosis.

## 2022-04-11 ENCOUNTER — Ambulatory Visit: Payer: Medicare Other | Admitting: Internal Medicine

## 2022-04-11 DIAGNOSIS — K6812 Psoas muscle abscess: Secondary | ICD-10-CM | POA: Diagnosis not present

## 2022-04-11 DIAGNOSIS — E1169 Type 2 diabetes mellitus with other specified complication: Secondary | ICD-10-CM | POA: Diagnosis not present

## 2022-04-11 DIAGNOSIS — T827XXA Infection and inflammatory reaction due to other cardiac and vascular devices, implants and grafts, initial encounter: Secondary | ICD-10-CM | POA: Diagnosis not present

## 2022-04-11 DIAGNOSIS — A4101 Sepsis due to Methicillin susceptible Staphylococcus aureus: Secondary | ICD-10-CM | POA: Diagnosis not present

## 2022-04-11 DIAGNOSIS — M4626 Osteomyelitis of vertebra, lumbar region: Secondary | ICD-10-CM | POA: Diagnosis not present

## 2022-04-11 DIAGNOSIS — M4646 Discitis, unspecified, lumbar region: Secondary | ICD-10-CM | POA: Diagnosis not present

## 2022-04-11 NOTE — Telephone Encounter (Signed)
Patients daughter aware of referral being resent.

## 2022-04-11 NOTE — Progress Notes (Signed)
Referral resent for spinal stenosis

## 2022-04-17 ENCOUNTER — Ambulatory Visit (INDEPENDENT_AMBULATORY_CARE_PROVIDER_SITE_OTHER): Payer: Medicare Other | Admitting: Podiatry

## 2022-04-17 DIAGNOSIS — B351 Tinea unguium: Secondary | ICD-10-CM | POA: Diagnosis not present

## 2022-04-17 DIAGNOSIS — S90422A Blister (nonthermal), left great toe, initial encounter: Secondary | ICD-10-CM

## 2022-04-17 DIAGNOSIS — M79675 Pain in left toe(s): Secondary | ICD-10-CM

## 2022-04-17 MED ORDER — SILVER SULFADIAZINE 1 % EX CREA: 1.0000 | TOPICAL_CREAM | Freq: Every day | CUTANEOUS | 1 refills | Status: DC

## 2022-04-17 NOTE — Progress Notes (Signed)
Chief Complaint  Patient presents with   Diabetes    Diabetic foot care A1c-7.3 bg- 129, nail trim     SUBJECTIVE Patient with a history of diabetes mellitus presents to office today complaining of elongated, thickened nails that cause pain while ambulating in shoes.  Patient is unable to trim their own nails.  History of transmetatarsal amputation right foot with Dr. Doran Durand, [facility].    Patient also states that she developed a superficial blister to the plantar medial aspect of the left first MTP which developed a few weeks ago.  She is currently wearing a postsurgical shoe for this.  Patient is here for further evaluation and treatment.   Past Medical History:  Diagnosis Date   Bruit    Carotid Doppler showed no significant abnormality     9per patient)   C. difficile colitis 07/2018   with severe sepsis   Chest pain, unspecified    Nuclear, May, 2008, no scar or ischemia   Diabetes mellitus    Diverticulosis    Dyslipidemia    Ejection fraction    EF 55-60%, echo, February, 2011 // Echocardiogram 8/21: EF 60-65, no RWMA, Gr 1 DD, GLS -14%, normal RVSF, mild LAE, trivial MR, mild MS (mean 4 mmHg), RVSP 23.4    GERD (gastroesophageal reflux disease)    Mitral regurgitation 04/21/2009   mild,  echo, February, 2011   Osteoporosis    Palpitations    possible very brief atrial fibrillation on monitor and possible reentrant tachycardia   Psoriasis    Rheumatoid arthritis ([place])     LT foot 04/17/2022  OBJECTIVE General Patient is awake, alert, and oriented x 3 and in no acute distress. Derm Skin is dry and supple bilateral. Negative open lesions or macerations. Remaining integument unremarkable. Nails are tender, long, thickened and dystrophic with subungual debris, consistent with onychomycosis, 1-5 left.  No signs of infection noted.  Superficial blister noted to the plantar medial aspect of the left first MTP.  Please see above photo Vasc  DP and PT pedal pulses palpable  left. Temperature gradient within normal limits.  Neuro Epicritic and protective threshold sensation diminished bilaterally.  Musculoskeletal Exam history of TMA right foot  ASSESSMENT 1. Diabetes Mellitus w/ peripheral neuropathy 2.  Pain due to onychomycosis of toenails left 3. PSxHx TMA RLE. Dr. Doran Durand. 01/2021 4.  Superficial blister medial aspect of the first MTP left  PLAN OF CARE 1. Patient evaluated today. 2.  Continue diabetic shoes with insoles with a toe filler to the right foot 3. Mechanical debridement of nails 1-5 left performed using a nail nipper. Filed with dremel without incident.  4.  Prescription for Silvadene cream.  Apply daily to the superficial blister to the medial aspect of the left first MTP with a Band-Aid daily  5.  Continue postoperative shoe to offload pressure from the blister area  6.  Return to clinic in 3 weeks for follow-up evaluation of the superficial blister    Edrick Kins, DPM [facility]  Dr. Edrick Kins, DPM    [address],  [address]                Office [phone]  Fax [phone]

## 2022-04-19 ENCOUNTER — Telehealth: Payer: Self-pay | Admitting: Internal Medicine

## 2022-04-19 NOTE — Telephone Encounter (Signed)
Pt is aware of loop implant date and time on 05/23/2022 at 9am.

## 2022-04-19 NOTE — Telephone Encounter (Signed)
Returned call to patient's daughter [name] ([place] per [facility]). She states it was discussed at her last office visit with Dr. Caryl Comes that she would have another ILR implanted. She would like to know if this is still the plan for the patient and how to get this scheduled.  Informed [name] that Dr. Caryl Comes and his nurse are out of the office until tomorrow. Will forward to Dr. Olin Pia nurse to follow-up.  [name] verbalized understanding and expressed appreciation for call.

## 2022-04-19 NOTE — Telephone Encounter (Signed)
Patient's daughter is requesting to speak with Dr. Olin Pia nurse. She states after her ILR was explanted she had a PPM implanted, but it became infected. She states Dr. Caryl Comes suggested having another ILR implanted and she would like to know when this will be scheduled.

## 2022-04-23 DIAGNOSIS — L97519 Non-pressure chronic ulcer of other part of right foot with unspecified severity: Secondary | ICD-10-CM | POA: Diagnosis not present

## 2022-04-23 DIAGNOSIS — Z89431 Acquired absence of right foot: Secondary | ICD-10-CM | POA: Diagnosis not present

## 2022-04-23 DIAGNOSIS — L84 Corns and callosities: Secondary | ICD-10-CM | POA: Diagnosis not present

## 2022-04-24 DIAGNOSIS — M5416 Radiculopathy, lumbar region: Secondary | ICD-10-CM | POA: Diagnosis not present

## 2022-04-30 ENCOUNTER — Telehealth: Payer: Self-pay

## 2022-04-30 DIAGNOSIS — G629 Polyneuropathy, unspecified: Secondary | ICD-10-CM | POA: Diagnosis not present

## 2022-04-30 NOTE — Telephone Encounter (Signed)
Spoke with patient's daughter [name], next available appointment isn't until 3/11. Patient is scheduled for then, but encouraged [name] to call back to check for cancellations, call the clinic if patient's status changes, and to update surgeon.   She would like patient to go ahead and have labs done prior to office visit on 3/11. Will route to provider.   Beryle Flock, RN

## 2022-04-30 NOTE — Telephone Encounter (Signed)
Patient's daughter called, patient was seen by Dr. Candiss Norse 1/23 and antibiotics were stopped.   Shelby Walker recently developed some sores on her foot that look like blisters and her surgeon placed her on doxycycline 100 mg BID for this and swelling in her foot. Doxy was started on 2/19 and she is scheduled to stop 2/28.   Patient's daughter would like to know if Shelby Walker needs to come back in for an appointment/labs since they were told to follow up as needed.   Beryle Flock, RN

## 2022-05-01 ENCOUNTER — Other Ambulatory Visit: Payer: Medicare Other

## 2022-05-01 ENCOUNTER — Other Ambulatory Visit: Payer: Self-pay

## 2022-05-01 DIAGNOSIS — M069 Rheumatoid arthritis, unspecified: Secondary | ICD-10-CM | POA: Diagnosis not present

## 2022-05-01 DIAGNOSIS — S91109A Unspecified open wound of unspecified toe(s) without damage to nail, initial encounter: Secondary | ICD-10-CM | POA: Diagnosis not present

## 2022-05-01 DIAGNOSIS — Z6820 Body mass index (BMI) 20.0-20.9, adult: Secondary | ICD-10-CM | POA: Diagnosis not present

## 2022-05-01 DIAGNOSIS — M810 Age-related osteoporosis without current pathological fracture: Secondary | ICD-10-CM | POA: Diagnosis not present

## 2022-05-01 DIAGNOSIS — M4646 Discitis, unspecified, lumbar region: Secondary | ICD-10-CM

## 2022-05-01 DIAGNOSIS — L409 Psoriasis, unspecified: Secondary | ICD-10-CM | POA: Diagnosis not present

## 2022-05-01 DIAGNOSIS — M7062 Trochanteric bursitis, left hip: Secondary | ICD-10-CM | POA: Diagnosis not present

## 2022-05-01 DIAGNOSIS — M25562 Pain in left knee: Secondary | ICD-10-CM | POA: Diagnosis not present

## 2022-05-01 DIAGNOSIS — Z79899 Other long term (current) drug therapy: Secondary | ICD-10-CM | POA: Diagnosis not present

## 2022-05-01 DIAGNOSIS — M1991 Primary osteoarthritis, unspecified site: Secondary | ICD-10-CM | POA: Diagnosis not present

## 2022-05-01 DIAGNOSIS — M5136 Other intervertebral disc degeneration, lumbar region: Secondary | ICD-10-CM | POA: Diagnosis not present

## 2022-05-01 NOTE — Telephone Encounter (Signed)
Spoke with Dr. Tommy Medal, orders placed for CBC, CMP, CRP, ESR per provider.   Beryle Flock, RN

## 2022-05-02 LAB — CBC WITH DIFFERENTIAL/PLATELET
Absolute Monocytes: 745 cells/uL (ref 200–950)
Basophils Absolute: 51 cells/uL (ref 0–200)
Basophils Relative: 1.1 %
Eosinophils Absolute: 189 cells/uL (ref 15–500)
Eosinophils Relative: 4.1 %
HCT: 29.6 % — ABNORMAL LOW (ref 35.0–45.0)
Hemoglobin: 9.6 g/dL — ABNORMAL LOW (ref 11.7–15.5)
Lymphs Abs: 1440 cells/uL (ref 850–3900)
MCH: 29.9 pg (ref 27.0–33.0)
MCHC: 32.4 g/dL (ref 32.0–36.0)
MCV: 92.2 fL (ref 80.0–100.0)
MPV: 11.9 fL (ref 7.5–12.5)
Monocytes Relative: 16.2 %
Neutro Abs: 2176 cells/uL (ref 1500–7800)
Neutrophils Relative %: 47.3 %
Platelets: 208 10*3/uL (ref 140–400)
RBC: 3.21 10*6/uL — ABNORMAL LOW (ref 3.80–5.10)
RDW: 13.5 % (ref 11.0–15.0)
Total Lymphocyte: 31.3 %
WBC: 4.6 10*3/uL (ref 3.8–10.8)

## 2022-05-02 LAB — SEDIMENTATION RATE: Sed Rate: 31 mm/h — ABNORMAL HIGH (ref 0–30)

## 2022-05-02 LAB — COMPREHENSIVE METABOLIC PANEL
AG Ratio: 1.7 (calc) (ref 1.0–2.5)
ALT: 12 U/L (ref 6–29)
AST: 18 U/L (ref 10–35)
Albumin: 3.6 g/dL (ref 3.6–5.1)
Alkaline phosphatase (APISO): 49 U/L (ref 37–153)
BUN/Creatinine Ratio: 28 (calc) — ABNORMAL HIGH (ref 6–22)
BUN: 44 mg/dL — ABNORMAL HIGH (ref 7–25)
CO2: 20 mmol/L (ref 20–32)
Calcium: 8.9 mg/dL (ref 8.6–10.4)
Chloride: 107 mmol/L (ref 98–110)
Creat: 1.58 mg/dL — ABNORMAL HIGH (ref 0.60–0.95)
Globulin: 2.1 g/dL (calc) (ref 1.9–3.7)
Glucose, Bld: 202 mg/dL — ABNORMAL HIGH (ref 65–99)
Potassium: 4.5 mmol/L (ref 3.5–5.3)
Sodium: 140 mmol/L (ref 135–146)
Total Bilirubin: 0.7 mg/dL (ref 0.2–1.2)
Total Protein: 5.7 g/dL — ABNORMAL LOW (ref 6.1–8.1)

## 2022-05-02 LAB — C-REACTIVE PROTEIN: CRP: 45.8 mg/L — ABNORMAL HIGH (ref <8.0)

## 2022-05-07 DIAGNOSIS — L84 Corns and callosities: Secondary | ICD-10-CM | POA: Diagnosis not present

## 2022-05-07 DIAGNOSIS — L97519 Non-pressure chronic ulcer of other part of right foot with unspecified severity: Secondary | ICD-10-CM | POA: Diagnosis not present

## 2022-05-07 DIAGNOSIS — M79672 Pain in left foot: Secondary | ICD-10-CM | POA: Diagnosis not present

## 2022-05-08 DIAGNOSIS — M79672 Pain in left foot: Secondary | ICD-10-CM | POA: Diagnosis not present

## 2022-05-08 DIAGNOSIS — M79671 Pain in right foot: Secondary | ICD-10-CM | POA: Diagnosis not present

## 2022-05-09 ENCOUNTER — Other Ambulatory Visit: Payer: Self-pay | Admitting: Infectious Disease

## 2022-05-09 ENCOUNTER — Encounter: Payer: Self-pay | Admitting: Infectious Disease

## 2022-05-09 MED ORDER — CEFADROXIL 500 MG PO CAPS: 500.0000 mg | ORAL_CAPSULE | Freq: Two times a day (BID) | ORAL | 0 refills | Status: DC

## 2022-05-09 NOTE — Telephone Encounter (Addendum)
Patient daughter [name] called concerned stating that patient foot is in a lot of pain. It hurts to change dressing on her foot.  [name] states that the patient doesn't have a fever or chills. [name] thinks that the patient needs to be on antibiotic until appointment on 3/11. [name] has also reached out to orthopedic doctor.    [place], CMA

## 2022-05-10 ENCOUNTER — Encounter: Payer: Self-pay | Admitting: Radiology

## 2022-05-11 ENCOUNTER — Ambulatory Visit: Payer: Medicare Other | Admitting: Cardiovascular Disease

## 2022-05-11 DIAGNOSIS — R200 Anesthesia of skin: Secondary | ICD-10-CM | POA: Diagnosis not present

## 2022-05-11 NOTE — Telephone Encounter (Signed)
Patient daughter [name] aware abx has been sent to pharmacy.   [place], CMA

## 2022-05-14 ENCOUNTER — Other Ambulatory Visit: Payer: Self-pay

## 2022-05-14 ENCOUNTER — Ambulatory Visit (INDEPENDENT_AMBULATORY_CARE_PROVIDER_SITE_OTHER): Payer: Medicare Other | Admitting: Internal Medicine

## 2022-05-14 VITALS — BP 111/64 | HR 77 | Temp 98.8°F

## 2022-05-14 DIAGNOSIS — E11628 Type 2 diabetes mellitus with other skin complications: Secondary | ICD-10-CM | POA: Diagnosis not present

## 2022-05-14 DIAGNOSIS — L97519 Non-pressure chronic ulcer of other part of right foot with unspecified severity: Secondary | ICD-10-CM | POA: Diagnosis not present

## 2022-05-14 DIAGNOSIS — S31000A Unspecified open wound of lower back and pelvis without penetration into retroperitoneum, initial encounter: Secondary | ICD-10-CM | POA: Diagnosis not present

## 2022-05-14 DIAGNOSIS — L089 Local infection of the skin and subcutaneous tissue, unspecified: Secondary | ICD-10-CM | POA: Diagnosis not present

## 2022-05-14 DIAGNOSIS — Z794 Long term (current) use of insulin: Secondary | ICD-10-CM | POA: Diagnosis not present

## 2022-05-14 DIAGNOSIS — E11621 Type 2 diabetes mellitus with foot ulcer: Secondary | ICD-10-CM | POA: Diagnosis not present

## 2022-05-14 DIAGNOSIS — A0472 Enterocolitis due to Clostridium difficile, not specified as recurrent: Secondary | ICD-10-CM | POA: Diagnosis not present

## 2022-05-14 DIAGNOSIS — E1142 Type 2 diabetes mellitus with diabetic polyneuropathy: Secondary | ICD-10-CM | POA: Diagnosis not present

## 2022-05-14 MED ORDER — VANCOMYCIN HCL 125 MG PO CAPS: 125.0000 mg | ORAL_CAPSULE | Freq: Two times a day (BID) | ORAL | 0 refills | Status: AC

## 2022-05-14 NOTE — Patient Instructions (Signed)
Follow up in about 2 weeks with myself, Dr Candiss Norse, or Dr Tommy Medal.   Shelby Walker course of Cefadroxil  Start Vancomycin twice daily for the next 12 days

## 2022-05-14 NOTE — Progress Notes (Signed)
[facility]  CHIEF COMPLAINT:    Follow up for foot wound  SUBJECTIVE:    Shelby Walker is a 89 y.o. female with PMHx as below who presents to the clinic for foot wound and a pressure injury on her backside.   Patient here today with her daughter as a work in visit.    She has been seen previously by Dr Tommy Medal and Dr Candiss Norse for MSSA bacteremia in the setting of PPM (removed) and possible vertebral OM/psoas abscess with improvement noted on repeat MRI January 2024.  She completed antibiotics for this process and last saw Dr Candiss Norse on 04/11/22.  Two weeks ago, messages were sent via My Chart that patient had developed a sore on her left foot that looked like blister.  She was seen by podiatry 2/13 who took a picture in the chart.  She was then treated with Doxycycline '100mg'$  BID x 10 days by orthopedics from 2/19 - 2/28.  They requested follow up with [place].  This was first available appointment.    Dr Tommy Medal also prescribed cefadroxil '500mg'$  BID and patient has completed about 5 days of this so far.  An MRI was obtained via orthopedics showing some soft tissue swelling but no abscess or OM of the left nor right foot per the report that was sent to our clinic.   Additionally, Dr Tommy Medal got labs showing ESR 31 and CRP 45.8 on 05/01/22.  WBC was normal.  She reports today the initial area of concern on medial part of her distal foot is improved.  There is no warmth, erythema and the pain is improved.  Towards her heel, there is an area of bluish discoloration that appears almost consistent with bruising.  There is no surrounding erythema but it is tender.  She also has a new issue with a wound on her backside that has been bothering her.   Please see A&P for the details of today's visit and status of the patient's medical problems.   Patient's Medications  New Prescriptions   VANCOMYCIN (VANCOCIN) 125 MG CAPSULE    Take 1 capsule (125 mg total) by mouth 2 (two) times  daily for 12 days.  Previous Medications   ACETAMINOPHEN (TYLENOL) 325 MG TABLET    Take 1 tablet (325 mg total) by mouth every 6 (six) hours.   AMIODARONE (PACERONE) 200 MG TABLET    Take 0.5 tablets (100 mg total) by mouth daily.   ATORVASTATIN (LIPITOR) 20 MG TABLET    Take 1 tablet (20 mg total) by mouth daily.   CALCIUM CARBONATE-VITAMIN D 600-400 MG-UNIT TABLET    Take 1 tablet by mouth daily at 12 noon.    CEFADROXIL (DURICEF) 500 MG CAPSULE    Take 1 capsule (500 mg total) by mouth 2 (two) times daily.   CHLORHEXIDINE GLUCONATE CLOTH 2 % PADS    Apply 6 each topically every 12 (twelve) hours.   DICLOFENAC SODIUM (VOLTAREN) 1 % GEL    Apply 4 g topically 4 (four) times daily.   FEEDING SUPPLEMENT (ENSURE ENLIVE / ENSURE PLUS) LIQD    Take 237 mLs by mouth 2 (two) times daily with a meal.   GABAPENTIN (NEURONTIN) 100 MG CAPSULE    Take 1 capsule (100 mg total) by mouth 3 (three) times daily.   INSULIN ASPART (NOVOLOG) 100 UNIT/ML FLEXPEN    Inject 2 Units into the skin 3 (three) times daily with meals.  Sliding scale   INSULIN DEGLUDEC (TRESIBA) 100 UNIT/ML SOLN    Inject 4 Units into the skin at bedtime.   LIDOCAINE (LIDODERM) 5 %    Place 1 patch onto the skin daily. Remove & Discard patch within 12 hours or as directed by MD   MEGESTROL (MEGACE) 400 MG/10ML SUSPENSION    Take 10 mLs (400 mg total) by mouth 2 (two) times daily.   METHOCARBAMOL (ROBAXIN) 500 MG TABLET    Take 1 tablet (500 mg total) by mouth every 6 (six) hours as needed for muscle spasms.   MULTIPLE VITAMINS-MINERALS (CENTRUM SILVER 50+WOMEN PO)    Take 1 tablet by mouth daily.   NITROGLYCERIN (NITROSTAT) 0.4 MG SL TABLET    Dissolve 1 tablet under the tongue every 5 minutes as needed for chest pain. Max of 3 doses, then 911.   NYSTATIN (MYCOSTATIN) 100000 UNIT/ML SUSPENSION       PANTOPRAZOLE (PROTONIX) 40 MG TABLET    Take 1 tablet (40 mg total) by mouth daily.   POLYETHYLENE GLYCOL (MIRALAX / GLYCOLAX) 17 G PACKET     Take 17 g by mouth daily as needed for mild constipation.   PREDNISONE (DELTASONE) 5 MG TABLET    Take 1 tablet (5 mg total) by mouth daily with breakfast.   PYRITHIONE ZINC (SELSUN BLUE DRY SCALP) 1 % SHAMPOO    Apply 1 application topically once a week.   RESTASIS 0.05 % OPHTHALMIC EMULSION    Place 1 drop into both eyes 2 (two) times daily.   SACCHAROMYCES BOULARDII (FLORASTOR) 250 MG CAPSULE    Take 250 mg by mouth 2 (two) times daily.   SILVER SULFADIAZINE (SILVADENE) 1 % CREAM    Apply 1 Application topically daily.   SODIUM CHLORIDE 0.9 % INFUSION    Inject 0-10 mLs into the vein as needed (for administration of IV medications (carrier fluid)).   TRAZODONE (DESYREL) 50 MG TABLET    Take 0.5-1 tablets (25-50 mg total) by mouth at bedtime as needed for sleep.  Modified Medications   No medications on file  Discontinued Medications   No medications on file      Past Medical History:  Diagnosis Date   Bruit    Carotid Doppler showed no significant abnormality     9per patient)   C. difficile colitis 07/2018   with severe sepsis   Chest pain, unspecified    Nuclear, May, 2008, no scar or ischemia   Diabetes mellitus    Diverticulosis    Dyslipidemia    Ejection fraction    EF 55-60%, echo, February, 2011 // Echocardiogram 8/21: EF 60-65, no RWMA, Gr 1 DD, GLS -14%, normal RVSF, mild LAE, trivial MR, mild MS (mean 4 mmHg), RVSP 23.4    GERD (gastroesophageal reflux disease)    Mitral regurgitation 04/21/2009   mild,  echo, February, 2011   Osteoporosis    Palpitations    possible very brief atrial fibrillation on monitor and possible reentrant tachycardia   Psoriasis    Rheumatoid arthritis ([place])     Social History   Tobacco Use   Smoking status: Never   Smokeless tobacco: Never  Vaping Use   Vaping Use: Never used  Substance Use Topics   Alcohol use: No    Alcohol/week: 0.0 standard drinks of alcohol   Drug use: No    Family History  Problem Relation Age of Onset    Heart failure Father    Heart attack Father    Diabetes  Father    Heart attack Mother    Heart failure Mother    Diabetes Mother    Stroke Sister     Allergies  Allergen Reactions   Cholestyramine     Possible- Makes throat burn. Patient said she can't take it    Compazine [Prochlorperazine Edisylate] Swelling    REACTION: " tongue swell and unable to swallow"   Sulfonamide Derivatives Other (See Comments)    REACTION: " broke out with fine itching bumps"   Hydrocodone Other (See Comments)   Infliximab Other (See Comments)    Other reaction(s): rash   Leflunomide Other (See Comments)    Other reaction(s): diarrhea   Prochlorperazine Other (See Comments)    Other reaction(s): Unknown  Other Reaction(s): tongue swells   Rosuvastatin     Other reaction(s): muscle aches   Sulfa Antibiotics Other (See Comments)    Other reaction(s): tongue swelling    Review of Systems  All other systems reviewed and are negative.  Except as noted above.  OBJECTIVE:    Vitals:   05/14/22 1447  BP: 111/64  Pulse: 77  Temp: 98.8 F (37.1 C)  TempSrc: Oral  SpO2: 99%   There is no height or weight on file to calculate BMI.  Physical Exam Constitutional:      Appearance: Normal appearance.  HENT:     Head: Normocephalic and atraumatic.  Eyes:     Extraocular Movements: Extraocular movements intact.     Conjunctiva/sclera: Conjunctivae normal.  Skin:    General: Skin is warm and dry.     Comments: See image below of her left foot wound. Sacrum has a superficial pressure injury.  There is no warmth, drainage.    Neurological:     Mental Status: She is alert.      Labs and Microbiology:    Latest Ref Rng & Units 05/01/2022   10:11 AM 03/27/2022   11:04 AM 03/12/2022   11:14 AM  CBC  WBC 3.8 - 10.8 Thousand/uL 4.6  4.7  5.4   Hemoglobin 11.7 - 15.5 g/dL 9.6  10.5  10.6   Hematocrit 35.0 - 45.0 % 29.6  32.0  32.6   Platelets 140 - 400 Thousand/uL 208  218  220        Latest Ref Rng & Units 05/01/2022   10:11 AM 03/27/2022   11:04 AM 03/12/2022   11:14 AM  CMP  Glucose 65 - 99 mg/dL 202  162  249   BUN 7 - 25 mg/dL 44  41  46   Creatinine 0.60 - 0.95 mg/dL 1.58  1.32  1.48   Sodium 135 - 146 mmol/L 140  142  140   Potassium 3.5 - 5.3 mmol/L 4.5  4.7  4.3   Chloride 98 - 110 mmol/L 107  111  108   CO2 20 - 32 mmol/L '20  23  21   '$ Calcium 8.6 - 10.4 mg/dL 8.9  8.4  8.6   Total Protein 6.1 - 8.1 g/dL 5.7  6.1  6.6   Total Bilirubin 0.2 - 1.2 mg/dL 0.7  0.5  0.3   AST 10 - 35 U/L '18  29  13   '$ ALT 6 - 29 U/L 12  29  <3       ASSESSMENT & PLAN:    Clostridium difficile colitis Will prescribe Vancomycin '125mg'$  PO BID while on antibiotics and for 7 days after to prevent recurrence given their concern regarding this possibility.   Sacral  wound She has a superficial sacral pressure injury.  No signs of infection here currently.  Recommend offloading as able, keeping area clean, and use of Dakins cream for now.  Referral also placed to wound care for follow up.   Diabetic foot infection ([place]) She had a superficial blister noted on left foot at first MTP area.  This area looks good right now.  There is also a purplish area towards her heel that appears consistent with early pressure injury or bruising.  Does not appear infected right now.  She completed 10 days of doxycycline per orthopedics and Dr Tommy Medal also gave her 10 days of cefadroxil which she will complete later this week.  We will see her back in about 2 weeks to ensure doing okay.  Fortunately, MRI did not show any evidence of a deep infection such as osteo.  She is getting a pair of off loading boots recommended by her surgeon to keep as much pressure off this area as possible when not walking.     Orders Placed This Encounter  Procedures   AMB referral to wound care center    Referral Priority:   Routine    Referral Type:   Consultation    Number of Visits Requested:   [facility] 05/14/2022, 3:44 PM

## 2022-05-14 NOTE — Assessment & Plan Note (Signed)
She has a superficial sacral pressure injury.  No signs of infection here currently.  Recommend offloading as able, keeping area clean, and use of Dakins cream for now.  Referral also placed to wound care for follow up.

## 2022-05-14 NOTE — Assessment & Plan Note (Signed)
Will prescribe Vancomycin '125mg'$  PO BID while on antibiotics and for 7 days after to prevent recurrence given their concern regarding this possibility.

## 2022-05-14 NOTE — Assessment & Plan Note (Addendum)
She had a superficial blister noted on left foot at first MTP area.  This area looks good right now.  There is also a purplish area towards her heel that appears consistent with early pressure injury or bruising.  Does not appear infected right now.  She completed 10 days of doxycycline per orthopedics and Dr Tommy Medal also gave her 10 days of cefadroxil which she will complete later this week.  We will see her back in about 2 weeks to ensure doing okay.  Fortunately, MRI did not show any evidence of a deep infection such as osteo.  She is getting a pair of off loading boots recommended by her surgeon to keep as much pressure off this area as possible when not walking.

## 2022-05-15 ENCOUNTER — Ambulatory Visit: Payer: Medicare Other | Admitting: Podiatry

## 2022-05-18 ENCOUNTER — Telehealth: Payer: Self-pay | Admitting: Internal Medicine

## 2022-05-18 NOTE — Telephone Encounter (Signed)
Patient's daughter Shelby Walker called wanting to know if it still be okay for her mother to have the loop recorder implant on Wednesday, 3/20 while she is taking an antibiotic for a foot infection.

## 2022-05-18 NOTE — Telephone Encounter (Signed)
Spoke with the patient's daughter who states that the patient had a spot on her foot that they were worried would become a diabetic abscess. Her surgeon did not think she needed an antibiotic but she went and saw infectious disease and they recommended 10 days of cefadroxil followed by 1 week of vancomyocin. Patient has finished the cefadroxil. Wanted to make sure that the patient is still okay to have her loop recorder implanted next week. Advised to keep appointment and I will make Dr. Caryl Comes aware. We will call back if he feels that it should be pushed out.

## 2022-05-22 NOTE — Telephone Encounter (Signed)
Spoke with pt's daughter and advised Dr Caryl Comes has reviewed and feels it is best to postpone loop at this time due to infection.  Pt's daughter states she is relieved as pt is still having problems with her feet.  Advised will have Dr Olin Pia scheduler contact her with a new date for Loop implant.  Pt's daughter verbalizes understanding and thanked Therapist, sports for the call.

## 2022-05-23 ENCOUNTER — Ambulatory Visit: Payer: Medicare Other | Admitting: Internal Medicine

## 2022-05-23 DIAGNOSIS — L820 Inflamed seborrheic keratosis: Secondary | ICD-10-CM | POA: Diagnosis not present

## 2022-05-23 DIAGNOSIS — L723 Sebaceous cyst: Secondary | ICD-10-CM | POA: Diagnosis not present

## 2022-05-23 DIAGNOSIS — H61001 Unspecified perichondritis of right external ear: Secondary | ICD-10-CM | POA: Diagnosis not present

## 2022-05-23 DIAGNOSIS — L298 Other pruritus: Secondary | ICD-10-CM | POA: Diagnosis not present

## 2022-05-23 DIAGNOSIS — Z85828 Personal history of other malignant neoplasm of skin: Secondary | ICD-10-CM | POA: Diagnosis not present

## 2022-05-27 NOTE — Progress Notes (Unsigned)
Subjective:  Chief complaint: follow-up for foot ulcers    Patient ID: Shelby Walker, female    DOB: [birth year], 89 y.o.   MRN: [id number]  HPI  89 year old Caucasian woman with a past medical history significant for biases rheumatoid arthritis diabetes mellitus with history of diabetic foot infection and osteomyelitis status post amputation on the right side who was admitted to [facility] with MSSA bacteremia sepsis and septic shock with pacemaker infection.  Her pacemaker was explanted.  MRI of the lumbar sacral spine showed new hyperintensity L3-L4 that radiology thought could be degenerative versus discitis as well as some edema in the left psoas muscle.  I have concerned that she does indeed have lumbar discitis and potentially a psoas muscle early abscess that she was developing in November.  On the whole she seems clinically better on cefazolin which she i\completed on March 10, 2021.  I wanted to make sure we have repeat imaging of her spine prior to stopping the antibiotics.   Repeat MRi in early January of 2024 showed:  1. No evidence of lumbar discitis or osteomyelitis. Previously noted mild T2 hyperintensity within the L3-4 disc and left psoas muscle has improved. 2. No acute findings identified. 3. Stable multilevel spondylosis, most advanced at L4-5 where there is severe multifactorial spinal stenosis and moderate lateral recess and foraminal narrowing bilaterally. Findings at this level may certainly contribute to back pain. 4. Stable moderate multifactorial spinal stenosis at L3-4 with chronic left greater than right lateral recess and foraminal narrowing.    She completed antibiotics for this process and last saw Dr Candiss Norse on 04/11/22.   Two weeks ago, messages were sent via My Chart that patient had developed a sore on her left foot that looked like blister.  She was seen by podiatry 2/13 who took a picture in the chart.  She was then treated with  Doxycycline 100mg  BID x 10 days by orthopedics from 2/19 - 2/28.  They requested follow up with [place].  This was first available appointment.     I also prescribed cefadroxil 500mg  BID and patient has completed about 5 days of this so far.  An MRI was obtained via orthopedics showing some soft tissue swelling but no abscess or OM of the left nor right foot per the report that was sent to our clinic.   Additionally, I got labs showing ESR 31 and CRP 45.8 on 05/01/22. She was seen by my partner Dr. Juleen China.   She had a prior history of C difficile colitis and Dr. Juleen China gave her vancomycin po while she finished her cefadroxil and doxycycline.  Her foot has two areas of skin breakdown but do not appear infected.  She still has some what sounds like sciatica pain that she relates to a fall on her leg last year.  Her rheumatoid arthritis is also causing her quite a bit of suffering and she has not been on any medications for that in recent times.      Past Medical History:  Diagnosis Date   Bruit    Carotid Doppler showed no significant abnormality     9per patient)   C. difficile colitis 07/2018   with severe sepsis   Chest pain, unspecified    Nuclear, May, 2008, no scar or ischemia   Diabetes mellitus    Diverticulosis    Dyslipidemia    Ejection fraction    EF 55-60%, echo, February, 2011 // Echocardiogram 8/21: EF 60-65, no RWMA, Gr 1  DD, GLS -14%, normal RVSF, mild LAE, trivial MR, mild MS (mean 4 mmHg), RVSP 23.4    GERD (gastroesophageal reflux disease)    Mitral regurgitation 04/21/2009   mild,  echo, February, 2011   Osteoporosis    Palpitations    possible very brief atrial fibrillation on monitor and possible reentrant tachycardia   Psoriasis    Rheumatoid arthritis [facility])     Past Surgical History:  Procedure Laterality Date   ABDOMINAL AORTOGRAM W/LOWER EXTREMITY Right 10/19/2020   Procedure: ABDOMINAL AORTOGRAM W/LOWER EXTREMITY;  Surgeon: Wellington Hampshire, MD;   Location: [place] [facility];  Service: Cardiovascular;  Laterality: Right;   FRACTURE SURGERY     LOOP RECORDER REMOVAL N/A 01/05/2022   Procedure: LOOP RECORDER REMOVAL;  Surgeon: Evans Lance, MD;  Location: [place] [facility];  Service: Cardiovascular;  Laterality: N/A;   PACEMAKER IMPLANT N/A 01/05/2022   Procedure: PACEMAKER IMPLANT;  Surgeon: Evans Lance, MD;  Location: [place] [facility];  Service: Cardiovascular;  Laterality: N/A;   PPM GENERATOR REMOVAL N/A 01/29/2022   Procedure: PPM GENERATOR REMOVAL;  Surgeon: Evans Lance, MD;  Location: [place] [facility];  Service: Cardiovascular;  Laterality: N/A;   TEE WITHOUT CARDIOVERSION N/A 01/29/2022   Procedure: TRANSESOPHAGEAL ECHOCARDIOGRAM (TEE);  Surgeon: Geralynn Rile, MD;  Location: [place];  Service: Cardiovascular;  Laterality: N/A;   TRANSMETATARSAL AMPUTATION Right 01/18/2021   Procedure: TRANSMETATARSAL AMPUTATION;  Surgeon: Wylene Simmer, MD;  Location: [place];  Service: Orthopedics;  Laterality: Right;    Family History  Problem Relation Age of Onset   Heart failure Father    Heart attack Father    Diabetes Father    Heart attack Mother    Heart failure Mother    Diabetes Mother    Stroke Sister       Social History   Socioeconomic History   Marital status: Widowed    Spouse name: [name]   Number of children: 2   Years of education: 13   Highest education level: Associate degree: academic program  Occupational History    Employer: RETIRED  Tobacco Use   Smoking status: Never   Smokeless tobacco: Never  Vaping Use   Vaping Use: Never used  Substance and Sexual Activity   Alcohol use: No    Alcohol/week: 0.0 standard drinks of alcohol   Drug use: No   Sexual activity: Yes    Birth control/protection: Post-menopausal  Other Topics Concern   Not on file  Social History Narrative   She is married to Rattan and lives with him 2 children   Retired   No alcohol or drugs and never  smoker   Social Determinants of Radio broadcast assistant Strain: Low Risk  (07/24/2018)   Overall Financial Resource Strain (CARDIA)    Difficulty of Paying Living Expenses: Not hard at all  Food Insecurity: No Food Insecurity (01/25/2022)   Hunger Vital Sign    Worried About Running Out of Food in the Last Year: Never true    Sublimity in the Last Year: Never true  Transportation Needs: No Transportation Needs (01/25/2022)   PRAPARE - Hydrologist (Medical): No    Lack of Transportation (Non-Medical): No  Physical Activity: Sufficiently Active (07/24/2018)   Exercise Vital Sign    Days of Exercise per Week: 5 days    Minutes of Exercise per Session: 30 min  Stress: No Stress Concern Present (07/24/2018)  [employer] Questionnaire    Feeling of Stress : Not at all  Social Connections: Socially Integrated (07/24/2018)   Social Connection and Isolation Panel [NHANES]    Frequency of Communication with Friends and Family: More than three times a week    Frequency of Social Gatherings with Friends and Family: More than three times a week    Attends Religious Services: More than 4 times per year    Active Member of [employer] or Organizations: Yes    Attends Music therapist: More than 4 times per year    Marital Status: Married    Allergies  Allergen Reactions   Cholestyramine     Possible- Makes throat burn. Patient said she can't take it    Compazine [Prochlorperazine Edisylate] Swelling    REACTION: " tongue swell and unable to swallow"   Sulfonamide Derivatives Other (See Comments)    REACTION: " broke out with fine itching bumps"   Hydrocodone Other (See Comments)   Infliximab Other (See Comments)    Other reaction(s): rash   Leflunomide Other (See Comments)    Other reaction(s): diarrhea   Prochlorperazine Other (See Comments)    Other reaction(s): Unknown  Other Reaction(s):  tongue swells   Rosuvastatin     Other reaction(s): muscle aches   Sulfa Antibiotics Other (See Comments)    Other reaction(s): tongue swelling     Current Outpatient Medications:    acetaminophen (TYLENOL) 325 MG tablet, Take 1 tablet (325 mg total) by mouth every 6 (six) hours., Disp: , Rfl:    amiodarone (PACERONE) 200 MG tablet, Take 0.5 tablets (100 mg total) by mouth daily., Disp: 15 tablet, Rfl: 11   atorvastatin (LIPITOR) 20 MG tablet, Take 1 tablet (20 mg total) by mouth daily., Disp: 90 tablet, Rfl: 3   Calcium Carbonate-Vitamin D 600-400 MG-UNIT tablet, Take 1 tablet by mouth daily at 12 noon. , Disp: , Rfl:    cefadroxil (DURICEF) 500 MG capsule, Take 1 capsule (500 mg total) by mouth 2 (two) times daily., Disp: 20 capsule, Rfl: 0   Chlorhexidine Gluconate Cloth 2 % PADS, Apply 6 each topically every 12 (twelve) hours., Disp: , Rfl:    diclofenac Sodium (VOLTAREN) 1 % GEL, Apply 4 g topically 4 (four) times daily., Disp: , Rfl:    feeding supplement (ENSURE ENLIVE / ENSURE PLUS) LIQD, Take 237 mLs by mouth 2 (two) times daily with a meal., Disp: 237 mL, Rfl: 12   gabapentin (NEURONTIN) 100 MG capsule, Take 1 capsule (100 mg total) by mouth 3 (three) times daily., Disp: 90 capsule, Rfl: 0   insulin aspart (NOVOLOG) 100 UNIT/ML FlexPen, Inject 2 Units into the skin 3 (three) times daily with meals. Sliding scale, Disp: 15 mL, Rfl: 11   Insulin Degludec (TRESIBA) 100 UNIT/ML SOLN, Inject 4 Units into the skin at bedtime., Disp: , Rfl:    lidocaine (LIDODERM) 5 %, Place 1 patch onto the skin daily. Remove & Discard patch within 12 hours or as directed by MD, Disp: 30 patch, Rfl: 0   megestrol (MEGACE) 400 MG/10ML suspension, Take 10 mLs (400 mg total) by mouth 2 (two) times daily., Disp: 480 mL, Rfl: 0   methocarbamol (ROBAXIN) 500 MG tablet, Take 1 tablet (500 mg total) by mouth every 6 (six) hours as needed for muscle spasms., Disp: 15 tablet, Rfl: 0   Multiple Vitamins-Minerals  (CENTRUM SILVER 50+WOMEN PO), Take 1 tablet by mouth daily., Disp: , Rfl:  nitroGLYCERIN (NITROSTAT) 0.4 MG SL tablet, Dissolve 1 tablet under the tongue every 5 minutes as needed for chest pain. Max of 3 doses, then 911., Disp: 25 tablet, Rfl: 6   nystatin (MYCOSTATIN) 100000 UNIT/ML suspension, , Disp: , Rfl:    pantoprazole (PROTONIX) 40 MG tablet, Take 1 tablet (40 mg total) by mouth daily., Disp: , Rfl:    polyethylene glycol (MIRALAX / GLYCOLAX) 17 g packet, Take 17 g by mouth daily as needed for mild constipation., Disp: 14 each, Rfl: 0   predniSONE (DELTASONE) 5 MG tablet, Take 1 tablet (5 mg total) by mouth daily with breakfast., Disp: 30 tablet, Rfl: 0   pyrithione zinc (SELSUN BLUE DRY SCALP) 1 % shampoo, Apply 1 application topically once a week., Disp: , Rfl:    RESTASIS 0.05 % ophthalmic emulsion, Place 1 drop into both eyes 2 (two) times daily., Disp: , Rfl:    saccharomyces boulardii (FLORASTOR) 250 MG capsule, Take 250 mg by mouth 2 (two) times daily., Disp: , Rfl:    silver sulfADIAZINE (SILVADENE) 1 % cream, Apply 1 Application topically daily., Disp: 25 g, Rfl: 1   sodium chloride 0.9 % infusion, Inject 0-10 mLs into the vein as needed (for administration of IV medications (carrier fluid)). (Patient not taking: Reported on 05/14/2022), Disp: , Rfl: 0   traZODone (DESYREL) 50 MG tablet, Take 0.5-1 tablets (25-50 mg total) by mouth at bedtime as needed for sleep., Disp: 15 tablet, Rfl: 0    Review of Systems  Constitutional:  Negative for activity change, appetite change, chills, diaphoresis, fatigue, fever and unexpected weight change.  HENT:  Negative for congestion, rhinorrhea, sinus pressure, sneezing, sore throat and trouble swallowing.   Eyes:  Negative for photophobia and visual disturbance.  Respiratory:  Negative for cough, chest tightness, shortness of breath, wheezing and stridor.   Cardiovascular:  Negative for chest pain, palpitations and leg swelling.   Gastrointestinal:  Negative for abdominal distention, abdominal pain, anal bleeding, blood in stool, constipation, diarrhea, nausea and vomiting.  Genitourinary:  Negative for difficulty urinating, dysuria, flank pain and hematuria.  Musculoskeletal:  Negative for arthralgias, back pain, gait problem, joint swelling and myalgias.  Skin:  Positive for wound. Negative for color change, pallor and rash.  Neurological:  Negative for dizziness, tremors, weakness and light-headedness.  Hematological:  Negative for adenopathy. Does not bruise/bleed easily.  Psychiatric/Behavioral:  Negative for agitation, behavioral problems, confusion, decreased concentration, dysphoric mood and sleep disturbance.        Objective:   Physical Exam Constitutional:      General: She is not in acute distress.    Appearance: Normal appearance. She is well-developed. She is not ill-appearing or diaphoretic.  HENT:     Head: Normocephalic and atraumatic.     Right Ear: Hearing and external ear normal.     Left Ear: Hearing and external ear normal.     Nose: No nasal deformity or rhinorrhea.  Eyes:     General: No scleral icterus.    Conjunctiva/sclera: Conjunctivae normal.     Right eye: Right conjunctiva is not injected.     Left eye: Left conjunctiva is not injected.     Pupils: Pupils are equal, round, and reactive to light.  Neck:     Vascular: No JVD.  Cardiovascular:     Rate and Rhythm: Normal rate and regular rhythm.     Heart sounds: S1 normal and S2 normal.  Abdominal:     General: There is no distension.  Palpations: Abdomen is soft.  Musculoskeletal:        General: Normal range of motion.     Right shoulder: Normal.     Left shoulder: Normal.     Cervical back: Normal range of motion and neck supple.     Right hip: Normal.     Left hip: Normal.     Right knee: Normal.     Left knee: Normal.  Lymphadenopathy:     Head:     Right side of head: No submandibular, preauricular or  posterior auricular adenopathy.     Left side of head: No submandibular, preauricular or posterior auricular adenopathy.     Cervical: No cervical adenopathy.     Right cervical: No superficial or deep cervical adenopathy.    Left cervical: No superficial or deep cervical adenopathy.  Skin:    General: Skin is warm and dry.     Coloration: Skin is not pale.     Findings: No abrasion, bruising, ecchymosis, erythema, lesion or rash.     Nails: There is no clubbing.  Neurological:     General: No focal deficit present.     Mental Status: She is alert and oriented to person, place, and time.     Sensory: No sensory deficit.     Coordination: Coordination normal.     Gait: Gait normal.  Psychiatric:        Attention and Perception: She is attentive.        Mood and Affect: Mood normal.        Speech: Speech normal.        Behavior: Behavior normal. Behavior is cooperative.        Thought Content: Thought content normal.        Judgment: Judgment normal.     Left foot 05/28/22:           Assessment & Plan:   Diabetic foot ulcers: Mid to skin breakdown seem to be due to friction.  There is no evidence of infection overtly.  She is complete antibiotics I see no reason to initiate them.  The critical part now is preventing the friction to the areas so that new wounds not do not develop an existing wounds can heal.   MSSA bacteremia with septic shock, pacemaker infection, post explantation also with presence of vertebral discitis and potential paraspinous infection.  Most recent MRI is reassuring   History of recurrent C. difficile colitis: He was on vancomycin prophylaxis while on antimicrobial antibiotics  Arthritis: I am reaching out to Dr. Amil Amen.  I do not think she should be treated Biologics given the severity of her recent infectious diseases including her MSSA bacteremia with pacemaker infection.  Immunosuppression is also a risk factor for C diff  Perhaps  something such as MTX could be safest?  I spent 42 minutes with the patient including than 50% of the time in face to face counseling of the patient and her disseminated MSSA infection her recent problems with diabetic foot ulcer, C. difficile colitis from an arthritis along with review of medical records in preparation for the visit and during the visit and in coordination of her care with Dr. Amil Amen

## 2022-05-28 ENCOUNTER — Encounter: Payer: Self-pay | Admitting: Infectious Disease

## 2022-05-28 ENCOUNTER — Other Ambulatory Visit: Payer: Self-pay

## 2022-05-28 ENCOUNTER — Ambulatory Visit (INDEPENDENT_AMBULATORY_CARE_PROVIDER_SITE_OTHER): Payer: Medicare Other | Admitting: Infectious Disease

## 2022-05-28 VITALS — BP 134/60 | HR 82 | Temp 97.6°F | Ht 61.5 in | Wt 111.0 lb

## 2022-05-28 DIAGNOSIS — E1151 Type 2 diabetes mellitus with diabetic peripheral angiopathy without gangrene: Secondary | ICD-10-CM

## 2022-05-28 DIAGNOSIS — I739 Peripheral vascular disease, unspecified: Secondary | ICD-10-CM

## 2022-05-28 DIAGNOSIS — L97521 Non-pressure chronic ulcer of other part of left foot limited to breakdown of skin: Secondary | ICD-10-CM | POA: Diagnosis not present

## 2022-05-28 DIAGNOSIS — A0472 Enterocolitis due to Clostridium difficile, not specified as recurrent: Secondary | ICD-10-CM | POA: Diagnosis not present

## 2022-05-28 DIAGNOSIS — R7881 Bacteremia: Secondary | ICD-10-CM | POA: Diagnosis not present

## 2022-05-28 DIAGNOSIS — M0520 Rheumatoid vasculitis with rheumatoid arthritis of unspecified site: Secondary | ICD-10-CM

## 2022-05-28 DIAGNOSIS — M4646 Discitis, unspecified, lumbar region: Secondary | ICD-10-CM | POA: Diagnosis not present

## 2022-05-28 DIAGNOSIS — B9561 Methicillin susceptible Staphylococcus aureus infection as the cause of diseases classified elsewhere: Secondary | ICD-10-CM

## 2022-05-28 DIAGNOSIS — E11621 Type 2 diabetes mellitus with foot ulcer: Secondary | ICD-10-CM

## 2022-05-28 DIAGNOSIS — T827XXD Infection and inflammatory reaction due to other cardiac and vascular devices, implants and grafts, subsequent encounter: Secondary | ICD-10-CM

## 2022-06-01 ENCOUNTER — Encounter (HOSPITAL_BASED_OUTPATIENT_CLINIC_OR_DEPARTMENT_OTHER): Payer: Medicare Other | Attending: General Surgery | Admitting: Internal Medicine

## 2022-06-01 DIAGNOSIS — Z872 Personal history of diseases of the skin and subcutaneous tissue: Secondary | ICD-10-CM | POA: Diagnosis not present

## 2022-06-01 DIAGNOSIS — Z0389 Encounter for observation for other suspected diseases and conditions ruled out: Secondary | ICD-10-CM | POA: Diagnosis not present

## 2022-06-01 DIAGNOSIS — E1165 Type 2 diabetes mellitus with hyperglycemia: Secondary | ICD-10-CM | POA: Diagnosis not present

## 2022-06-01 DIAGNOSIS — Z89431 Acquired absence of right foot: Secondary | ICD-10-CM | POA: Diagnosis not present

## 2022-06-01 DIAGNOSIS — M069 Rheumatoid arthritis, unspecified: Secondary | ICD-10-CM | POA: Insufficient documentation

## 2022-06-01 DIAGNOSIS — Z8631 Personal history of diabetic foot ulcer: Secondary | ICD-10-CM | POA: Insufficient documentation

## 2022-06-01 DIAGNOSIS — L89151 Pressure ulcer of sacral region, stage 1: Secondary | ICD-10-CM | POA: Diagnosis not present

## 2022-06-04 DIAGNOSIS — E11319 Type 2 diabetes mellitus with unspecified diabetic retinopathy without macular edema: Secondary | ICD-10-CM | POA: Diagnosis not present

## 2022-06-06 NOTE — Progress Notes (Signed)
[name], [name] ([id number]) [id number] Page 1 of 6 Visit Report for 06/01/2022 Chief Complaint Document Details Patient Name: Date of Service: Shelby Walker, Shelby Walker 06/01/2022 8:00 A M Medical Record Number: [id number] Patient Account Number: [id number] Date of Birth/Sex: Treating RN: 08/11/1933 (89 y.o. F) Primary Care Provider: Reynold Bowen Other Clinician: Referring Provider: Treating Provider/Extender: Betsey Holiday in Treatment: 0 Information Obtained from: Patient Chief Complaint 06/01/2022; previous history of sacral ulcer and bilateral feet wounds. Electronic Signature(s) Signed: 06/01/2022 10:17:44 AM By: Kalman Shan DO Entered By: Kalman Shan on 06/01/2022 09:16:08 -------------------------------------------------------------------------------- HPI Details Patient Name: Date of Service: Shelby Walker. 06/01/2022 8:00 A M Medical Record Number: [id number] Patient Account Number: [id number] Date of Birth/Sex: Treating RN: 21-Aug-1933 (89 y.o. F) Primary Care Provider: Reynold Bowen Other Clinician: Referring Provider: Treating Provider/Extender: Betsey Holiday in Treatment: 0 History of Present Illness HPI Description: 06/01/2022 Ms. [name] is an 89 year old female with a past medical history of type 2 diabetes with previous transmetatarsal of the right foot, rheumatoid arthritis that presents to the clinic for concern of wounds to her sacrum and bilateral feet. She was referred by infectious disease and orthopedic surgery for these issues. Daughter is present and helps provide the history. She states that 6 weeks ago she developed pressure injuries and superficial wounds to her feet. She was treated with a long course of antibiotics Due to concern for infection. She has been using Silvadene cream to the areas. She is also been using bunny boots. Over the past several weeks the wounds have healed. She  also developed a sore to her sacrum 2 to 3 weeks ago. She has been keeping the area clean. This area is also healed. She currently denies signs of infection. Electronic Signature(s) Signed: 06/01/2022 10:17:44 AM By: Kalman Shan DO Entered By: Kalman Shan on 06/01/2022 09:20:50 -------------------------------------------------------------------------------- Physical Exam Details Patient Name: Date of Service: [name], [name] 06/01/2022 8:00 A M Medical Record Number: [id number] Patient Account Number: [id number] Date of Birth/Sex: Treating RN: August 22, 1933 (89 y.o. F) Primary Care Provider: Reynold Bowen Other Clinician: Referring Provider: Treating Provider/Extender: Betsey Holiday in Treatment: 0 Constitutional respirations regular, non-labored and within target range for patient.. Cardiovascular 2+ dorsalis pedis/posterior tibialis pulses. Psychiatric pleasant and cooperative. ANYLA, SOLINSKY ([id number]) [id number] Page 2 of 6 Notes Epithelization to the previous wound sites on the feet bilaterally. Pressure injury has resolved to the left heel. No open wounds noted. T the sacrum there is o epithelization to the previous wound site. Electronic Signature(s) Signed: 06/01/2022 10:17:44 AM By: Kalman Shan DO Entered By: Kalman Shan on 06/01/2022 09:22:58 -------------------------------------------------------------------------------- Physician Orders Details Patient Name: Date of Service: Shelby Walker. 06/01/2022 8:00 A M Medical Record Number: [id number] Patient Account Number: [id number] Date of Birth/Sex: Treating RN: [birth year] (89 y.o. Tonita Phoenix, Lauren Primary Care Provider: Reynold Bowen Other Clinician: Referring Provider: Treating Provider/Extender: Betsey Holiday in Treatment: 0 Verbal / Phone Orders: No Diagnosis Coding ICD-10 Coding Code Description M06.9 Rheumatoid  arthritis, unspecified E11.65 Type 2 diabetes mellitus with hyperglycemia Discharge From [facility] Services Discharge from [place] Non Wound Condition Protect area with: - protect any bony prominences with callous pads or self adhesive foam bandaids Electronic Signature(s) Signed: 06/01/2022 10:17:44 AM By: Kalman Shan DO Entered By: Kalman Shan on 06/01/2022 09:23:06 -------------------------------------------------------------------------------- Problem List Details Patient Name: Date of Service: Shelby Walker. 06/01/2022 8:00 A M Medical Record Number: [id number]  Patient Account Number: [id number] Date of Birth/Sex: Treating RN: 1933/03/26 (89 y.o. F) Primary Care Provider: Reynold Bowen Other Clinician: Referring Provider: Treating Provider/Extender: Betsey Holiday in Treatment: 0 Active Problems ICD-10 Encounter Code Description Active Date MDM Diagnosis E11.65 Type 2 diabetes mellitus with hyperglycemia 06/01/2022 No Yes L89.151 Pressure ulcer of sacral region, stage 1 06/01/2022 No Yes M06.9 Rheumatoid arthritis, unspecified 06/01/2022 No Yes Inactive Problems Resolved Problems Shelby, Walker ([id number]) [id number] Page 3 of 6 Electronic Signature(s) Signed: 06/01/2022 10:17:44 AM By: Kalman Shan DO Entered By: Kalman Shan on 06/01/2022 09:25:02 -------------------------------------------------------------------------------- Progress Note Details Patient Name: Date of Service: Shelby Walker. 06/01/2022 8:00 A M Medical Record Number: [id number] Patient Account Number: [id number] Date of Birth/Sex: Treating RN: [birth year] (89 y.o. F) Primary Care Provider: Reynold Bowen Other Clinician: Referring Provider: Treating Provider/Extender: Betsey Holiday in Treatment: 0 Subjective Chief Complaint Information obtained from Patient 06/01/2022; previous history of sacral ulcer and  bilateral feet wounds. History of Present Illness (HPI) 06/01/2022 [name] is an 89 year old female with a past medical history of type 2 diabetes with previous transmetatarsal of the right foot, rheumatoid arthritis that presents to the clinic for concern of wounds to her sacrum and bilateral feet. She was referred by infectious disease and orthopedic surgery for these issues. Daughter is present and helps provide the history. She states that 6 weeks ago she developed pressure injuries and superficial wounds to her feet. She was treated with a long course of antibiotics Due to concern for infection. She has been using Silvadene cream to the areas. She is also been using bunny boots. Over the past several weeks the wounds have healed. She also developed a sore to her sacrum 2 to 3 weeks ago. She has been keeping the area clean. This area is also healed. She currently denies signs of infection. Patient History Information obtained from Patient, Chart. Allergies cholestyramine, Compazine, Sulfa (Sulfonamide Antibiotics), hydrocodone, infliximab, leflunomide, prochlorperazine, rosuvastatin Family History Unknown History. Social History Never smoker, Marital Status - Widowed, Alcohol Use - Never, Drug Use - No History, Caffeine Use - Rarely. Medical History Endocrine Patient has history of Type II Diabetes Musculoskeletal Patient has history of Osteoarthritis Hospitalization/Surgery History - Ppm generator removal. - tee without conversion. - pacemaker implaint. - loop recorder removal. - transmet. amp. right. - abdominal aortogram w/ lower extremity right. - fracture surgery. Medical A Surgical History Notes nd Cardiovascular mitral regurg., bruit, chest pain,unspecified, dyslipidemia, ejection fraction 55-60 % , palpitations Review of Systems (ROS) Eyes Denies complaints or symptoms of Dry Eyes, Vision Changes, Glasses / Contacts. Ear/Nose/Mouth/Throat Denies complaints or symptoms  of Chronic sinus problems or rhinitis. Respiratory Denies complaints or symptoms of Chronic or frequent coughs, Shortness of Breath. Gastrointestinal GERD Genitourinary Denies complaints or symptoms of Frequent urination. Integumentary (Skin) Complains or has symptoms of Wounds, psoiriasis Musculoskeletal osteoporosis Neurologic Denies complaints or symptoms of Numbness/parasthesias. Psychiatric Denies complaints or symptoms of Claustrophobia. ARHIANA, BOBIER ([id number]) [id number] Page 4 of 6 Objective Constitutional respirations regular, non-labored and within target range for patient.. Vitals Time Taken: 8:08 AM, Temperature: 98.6 F, Pulse: 80 bpm, Respiratory Rate: 17 breaths/min, Blood Pressure: 187/76 mmHg, Capillary Blood Glucose: 115 mg/dl. Cardiovascular 2+ dorsalis pedis/posterior tibialis pulses. Psychiatric pleasant and cooperative. General Notes: Epithelization to the previous wound sites on the feet bilaterally. Pressure injury has resolved to the left heel. No open wounds noted. T the o sacrum there is epithelization to the previous wound site. Assessment  Active Problems ICD-10 Rheumatoid arthritis, unspecified Type 2 diabetes mellitus with hyperglycemia Patient has a history of diabetic foot ulcers with previous transmetatarsal amputation on the right. Fortunately her previous wounds that she had 6 weeks ago have healed. I did recommend she continue the bunny boots when sitting and at night. She knows she cannot ambulate with these. Also recommended AandD ointment to the sacrum daily to keep the area from breaking down. Continue aggressive offloading. She may follow-up as needed. Plan Discharge From [facility] Services: Discharge from [place] Non Wound Condition: Protect area with: - protect any bony prominences with callous pads or self adhesive foam bandaids 1. Follow-up as needed Electronic Signature(s) Signed: 06/01/2022  10:17:44 AM By: Kalman Shan DO Entered By: Kalman Shan on 06/01/2022 09:24:11 -------------------------------------------------------------------------------- HxROS Details Patient Name: Date of Service: Shelby Walker. 06/01/2022 8:00 A M Medical Record Number: [id number] Patient Account Number: [id number] Date of Birth/Sex: Treating RN: 1933/09/10 (89 y.o. Tonita Phoenix, Lauren Primary Care Provider: Reynold Bowen Other Clinician: Referring Provider: Treating Provider/Extender: Betsey Holiday in Treatment: 0 Information Obtained From Patient Chart Eyes Complaints and Symptoms: Negative for: Dry Eyes; Vision Changes; Glasses / Contacts Ear/Nose/Mouth/Throat Complaints and Symptoms: Negative for: Chronic sinus problems or rhinitis KALAIYA, MARKUS ([id number]) [id number] Page 5 of 6 Respiratory Complaints and Symptoms: Negative for: Chronic or frequent coughs; Shortness of Breath Genitourinary Complaints and Symptoms: Negative for: Frequent urination Integumentary (Skin) Complaints and Symptoms: Positive for: Wounds Review of System Notes: psoiriasis Neurologic Complaints and Symptoms: Negative for: Numbness/parasthesias Psychiatric Complaints and Symptoms: Negative for: Claustrophobia Hematologic/Lymphatic Cardiovascular Medical History: Past Medical History Notes: mitral regurg., bruit, chest pain,unspecified, dyslipidemia, ejection fraction 55-60 % , palpitations Gastrointestinal Complaints and Symptoms: Review of System Notes: GERD Endocrine Medical History: Positive for: Type II Diabetes Immunological Musculoskeletal Complaints and Symptoms: Review of System Notes: osteoporosis Medical History: Positive for: Osteoarthritis Oncologic Immunizations Pneumococcal Vaccine: Received Pneumococcal Vaccination: No Implantable Devices None Hospitalization / Surgery History Type of  Hospitalization/Surgery Ppm generator removal tee without conversion pacemaker implaint loop recorder removal transmet. amp. right abdominal aortogram w/ lower extremity right fracture surgery REBEKAH, JOERG P ([id number]) [id number] Page 6 of 6 Family and Social History Unknown History: Yes; Never smoker; Marital Status - Widowed; Alcohol Use: Never; Drug Use: No History; Caffeine Use: Rarely; Financial Concerns: No; Food, Clothing or Shelter Needs: No; Support System Lacking: No; Transportation Concerns: No Electronic Signature(s) Signed: 06/01/2022 10:17:44 AM By: Kalman Shan DO Signed: 06/06/2022 4:24:30 PM By: Rhae Hammock RN Entered By: Rhae Hammock on 05/31/2022 16:16:15 -------------------------------------------------------------------------------- SuperBill Details Patient Name: Date of Service: MCKENIZE, COMINS 06/01/2022 Medical Record Number: [id number] Patient Account Number: [id number] Date of Birth/Sex: Treating RN: [birth year] (89 y.o. Tonita Phoenix, Lauren Primary Care Provider: Reynold Bowen Other Clinician: Referring Provider: Treating Provider/Extender: Betsey Holiday in Treatment: 0 Diagnosis Coding ICD-10 Codes Code Description E11.65 Type 2 diabetes mellitus with hyperglycemia L89.151 Pressure ulcer of sacral region, stage 1 M06.9 Rheumatoid arthritis, unspecified Facility Procedures : CPT4 Code: [id number] Description: [phone] - WOUND CARE VISIT-LEV 2 EST PT Modifier: Quantity: 1 Physician Procedures : CPT4 Code Description Modifier [id number] - WC PHYS LEVEL 4 - NEW PT ICD-10 Diagnosis Description L89.151 Pressure ulcer of sacral region, stage 1 E11.65 Type 2 diabetes mellitus with hyperglycemia M06.9 Rheumatoid arthritis, unspecified Quantity: 1 Electronic Signature(s) Signed: 06/01/2022 10:17:44 AM By: Kalman Shan DO Entered By: Kalman Shan on 06/01/2022 09:25:34

## 2022-06-06 NOTE — Progress Notes (Signed)
Shelby Walker, Shelby Walker ([id number]) [id number].pdf Page 1 of 6 Visit Report for 06/01/2022 Allergy List Details Patient Name: Date of Service: Shelby Walker, Shelby Walker 06/01/2022 8:00 A M Medical Record Number: [id number] Patient Account Number: [id number] Date of Birth/Sex: Treating RN: 1933/03/08 (89 y.o. Shelby Walker, Shelby Walker Primary Care Tehilla Coffel: Reynold Bowen Other Clinician: Referring Wojciech Willetts: Treating Ritta Hammes/Extender: Betsey Holiday in Treatment: 0 Allergies Active Allergies cholestyramine Compazine Sulfa (Sulfonamide Antibiotics) hydrocodone infliximab leflunomide prochlorperazine rosuvastatin Allergy Notes Electronic Signature(s) Signed: 06/06/2022 4:24:30 PM By: Rhae Hammock RN Entered By: Rhae Hammock on 05/31/2022 16:12:48 -------------------------------------------------------------------------------- Arrival Information Details Patient Name: Date of Service: [name] 06/01/2022 8:00 A M Medical Record Number: [id number] Patient Account Number: [id number] Date of Birth/Sex: Treating RN: 09-18-1933 (89 y.o. Shelby Walker, Shelby Walker Primary Care Munirah Doerner: Reynold Bowen Other Clinician: Referring Jalissa Heinzelman: Treating Rhaelyn Giron/Extender: Betsey Holiday in Treatment: 0 Visit Information Patient Arrived: Gilford Rile Arrival Time: 08:04 Accompanied By: daughter Transfer Assistance: Manual Patient Identification Verified: Yes Secondary Verification Process Completed: Yes Patient Requires Transmission-Based Precautions: No Patient Has Alerts: No Electronic Signature(s) Signed: 06/06/2022 4:24:30 PM By: Rhae Hammock RN Entered By: Rhae Hammock on 06/01/2022 08:07:59 -------------------------------------------------------------------------------- Clinic Level of Care Assessment Details Patient Name: Date of Service: Shelby Walker, Shelby Walker 06/01/2022 8:00 A M Medical Record Number: [id number] Patient Account  Number: [id number] Shelby Walker, Shelby Walker ([id number]) [phone].pdf Page 2 of 6 Date of Birth/Sex: Treating RN: 06/27/1933 (89 y.o. Shelby Walker, Shelby Walker Primary Care Lawsyn Heiler: Other Clinician: Reynold Bowen Referring Odelle Kosier: Treating Ally Knodel/Extender: Betsey Holiday in Treatment: 0 Clinic Level of Care Assessment Items TOOL 4 Quantity Score X- 1 0 Use when only an EandM is performed on FOLLOW-UP visit ASSESSMENTS - Nursing Assessment / Reassessment X- 1 10 Reassessment of Co-morbidities (includes updates in patient status) X- 1 5 Reassessment of Adherence to Treatment Plan ASSESSMENTS - Wound and Skin A ssessment / Reassessment X - Simple Wound Assessment / Reassessment - one wound 1 5 []  - 0 Complex Wound Assessment / Reassessment - multiple wounds []  - 0 Dermatologic / Skin Assessment (not related to wound area) ASSESSMENTS - Focused Assessment []  - 0 Circumferential Edema Measurements - multi extremities []  - 0 Nutritional Assessment / Counseling / Intervention []  - 0 Lower Extremity Assessment (monofilament, tuning fork, pulses) []  - 0 Peripheral Arterial Disease Assessment (using hand held doppler) ASSESSMENTS - Ostomy and/or Continence Assessment and Care []  - 0 Incontinence Assessment and Management []  - 0 Ostomy Care Assessment and Management (repouching, etc.) PROCESS - Coordination of Care X - Simple Patient / Family Education for ongoing care 1 15 []  - 0 Complex (extensive) Patient / Family Education for ongoing care X- 1 10 Staff obtains Programmer, systems, Records, T Results / Process Orders est []  - 0 Staff telephones HHA, Nursing Homes / Clarify orders / etc []  - 0 Routine Transfer to another Facility (non-emergent condition) []  - 0 Routine Hospital Admission (non-emergent condition) X- 1 15 New Admissions / Biomedical engineer / Ordering NPWT Apligraf, etc. , []  - 0 Emergency Hospital Admission (emergent  condition) X- 1 10 Simple Discharge Coordination []  - 0 Complex (extensive) Discharge Coordination PROCESS - Special Needs []  - 0 Pediatric / Minor Patient Management []  - 0 Isolation Patient Management []  - 0 Hearing / Language / Visual special needs []  - 0 Assessment of Community assistance (transportation, D/C planning, etc.) []  - 0 Additional assistance / Altered mentation []  - 0 Support Surface(s) Assessment (bed, cushion, seat, etc.) INTERVENTIONS -  Wound Cleansing / Measurement []  - 0 Simple Wound Cleansing - one wound []  - 0 Complex Wound Cleansing - multiple wounds []  - 0 Wound Imaging (photographs - any number of wounds) []  - 0 Wound Tracing (instead of photographs) []  - 0 Simple Wound Measurement - one wound []  - 0 Complex Wound Measurement - multiple wounds Shelby Walker, Shelby Walker ([id number]) [id number] Page 3 of 6 INTERVENTIONS - Wound Dressings []  - 0 Small Wound Dressing one or multiple wounds []  - 0 Medium Wound Dressing one or multiple wounds []  - 0 Large Wound Dressing one or multiple wounds []  - 0 Application of Medications - topical []  - 0 Application of Medications - injection INTERVENTIONS - Miscellaneous []  - 0 External ear exam []  - 0 Specimen Collection (cultures, biopsies, blood, body fluids, etc.) []  - 0 Specimen(s) / Culture(s) sent or taken to Lab for analysis []  - 0 Patient Transfer (multiple staff / Civil Service fast streamer / Similar devices) []  - 0 Simple Staple / Suture removal (25 or less) []  - 0 Complex Staple / Suture removal (26 or more) []  - 0 Hypo / Hyperglycemic Management (close monitor of Blood Glucose) []  - 0 Ankle / Brachial Index (ABI) - do not check if billed separately X- 1 5 Vital Signs Has the patient been seen at the hospital within the last three years: Yes Total Score: 75 Level Of Care: New/Established - Level 2 Electronic Signature(s) Signed: 06/06/2022 4:24:30 PM By: Rhae Hammock RN Entered  By: Rhae Hammock on 06/01/2022 08:56:57 -------------------------------------------------------------------------------- Encounter Discharge Information Details Patient Name: Date of Service: [name] 06/01/2022 8:00 A M Medical Record Number: [id number] Patient Account Number: [id number] Date of Birth/Sex: Treating RN: 1934-01-13 (89 y.o. Shelby Walker, Shelby Walker Primary Care Savannah Erbe: Reynold Bowen Other Clinician: Referring Shermaine Brigham: Treating Kinsley Nicklaus/Extender: Betsey Holiday in Treatment: 0 Encounter Discharge Information Items Discharge Condition: Stable Ambulatory Status: Walker Discharge Destination: Home Transportation: Private Auto Accompanied By: daughter Schedule Follow-up Appointment: Yes Clinical Summary of Care: Patient Declined Electronic Signature(s) Signed: 06/06/2022 4:24:30 PM By: Rhae Hammock RN Entered By: Rhae Hammock on 06/01/2022 08:57:28 -------------------------------------------------------------------------------- Lower Extremity Assessment Details Patient Name: Date of Service: [name] 06/01/2022 8:00 A M Medical Record Number: [id number] Patient Account Number: [id number] Date of Birth/Sex: Treating RN: 1933-09-23 (89 y.o. Shelby Walker, Shelby Walker Primary Care Maressa Apollo: Reynold Bowen Other Clinician: Referring Tara Rud: Treating Upton Russey/Extender: Betsey Holiday in Treatment: 0 Shelby Walker, Shelby Walker ([id number]) [id number] Page 4 of 6 Electronic Signature(s) Signed: 06/06/2022 4:24:30 PM By: Rhae Hammock RN Entered By: Rhae Hammock on 06/01/2022 08:55:18 -------------------------------------------------------------------------------- Multi Wound Chart Details Patient Name: Date of Service: [name] 06/01/2022 8:00 A M Medical Record Number: [id number] Patient Account Number: [id number] Date of Birth/Sex: Treating RN: 1934-01-27 (89 y.o. F) Primary Care  Jovaun Levene: Reynold Bowen Other Clinician: Referring Chena Chohan: Treating Seiji Wiswell/Extender: Betsey Holiday in Treatment: 0 Vital Signs Height(in): Capillary Blood Glucose(mg/dl): 115 Weight(lbs): Pulse(bpm): 80 Body Mass Index(BMI): Blood Pressure(mmHg): 187/76 Temperature(F): 98.6 Respiratory Rate(breaths/min): 17 [Treatment Notes:Wound Assessments Treatment Notes] Electronic Signature(s) Signed: 06/01/2022 10:17:44 AM By: Kalman Shan DO Entered By: Kalman Shan on 06/01/2022 09:15:47 -------------------------------------------------------------------------------- Multi-Disciplinary Care Plan Details Patient Name: Date of Service: [name] 06/01/2022 8:00 A M Medical Record Number: [id number] Patient Account Number: [id number] Date of Birth/Sex: Treating RN: 1933/03/30 (89 y.o. Shelby Walker, Shelby Walker Primary Care Taino Maertens: Reynold Bowen Other Clinician: Referring Jaycen Vercher: Treating Tandrea Kommer/Extender: Betsey Holiday in Treatment: 0 Active Inactive  Electronic Signature(s) Signed: 06/06/2022 4:24:30 PM By: Rhae Hammock RN Entered By: Rhae Hammock on 06/01/2022 08:56:05 -------------------------------------------------------------------------------- Pain Assessment Details Patient Name: Date of Service: [name] 06/01/2022 8:00 A M Medical Record Number: [id number] Patient Account Number: [id number] Date of Birth/Sex: Treating RN: 1933-12-05 (89 y.o. Shelby Walker, Shelby Walker Primary Care Ashleyanne Hemmingway: Reynold Bowen Other Clinician: Referring Araly Kaas: Treating Kirby Argueta/Extender: Betsey Holiday in Treatment: 0 Active Problems Location of Pain Severity and Description of Pain Patient Has Paino Yes Site Locations Pain Location: Shelby Walker, Shelby Walker ([id number]) [phone].pdf Page 5 of 6 Pain Location: Generalized Pain With Dressing Change: Yes Duration of the  Pain. Constant / Intermittento Constant Rate the pain. Current Pain Level: 2 Worst Pain Level: 10 Least Pain Level: 0 Tolerable Pain Level: 2 Character of Pain Describe the Pain: Aching Pain Management and Medication Current Pain Management: Medication: No Cold Application: No Rest: No Massage: No Activity: No T.E.N.S.: No Heat Application: No Leg drop or elevation: No Is the Current Pain Management Adequate: Adequate How does your wound impact your activities of daily livingo Sleep: No Bathing: No Appetite: No Relationship With Others: No Bladder Continence: No Emotions: No Bowel Continence: No Work: No Toileting: No Drive: No Dressing: No Hobbies: No Electronic Signature(s) Signed: 06/06/2022 4:24:30 PM By: Rhae Hammock RN Entered By: Rhae Hammock on 06/01/2022 08:12:23 -------------------------------------------------------------------------------- Patient/Caregiver Education Details Patient Name: Date of Service: Shelby Walker, Shelby Y P. 3/29/2024andnbsp8:00 A M Medical Record Number: [id number] Patient Account Number: [id number] Date of Birth/Gender: Treating RN: [birth year] (89 y.o. Benjaman Lobe Primary Care Physician: Reynold Bowen Other Clinician: Referring Physician: Treating Physician/Extender: Betsey Holiday in Treatment: 0 Education Assessment Education Provided To: Patient Education Topics Provided Wound/Skin Impairment: Methods: Explain/Verbal Responses: Reinforcements needed, State content correctly Electronic Signature(s) Signed: 06/06/2022 4:24:30 PM By: Rhae Hammock RN Entered By: Rhae Hammock on 06/01/2022 08:56:13 Shelby Walker ([id number]) [id number] Page 6 of 6 -------------------------------------------------------------------------------- Vitals Details Patient Name: Date of Service: Shelby Walker, Shelby Walker 06/01/2022 8:00 A M Medical Record Number: [id number] Patient Account  Number: [id number] Date of Birth/Sex: Treating RN: 01-30-1934 (89 y.o. Shelby Walker, Shelby Walker Primary Care Ramani Riva: Reynold Bowen Other Clinician: Referring Kieanna Rollo: Treating Nikko Quast/Extender: Betsey Holiday in Treatment: 0 Vital Signs Time Taken: 08:08 Temperature (F): 98.6 Pulse (bpm): 80 Respiratory Rate (breaths/min): 17 Blood Pressure (mmHg): 187/76 Capillary Blood Glucose (mg/dl): 115 Reference Range: 80 - 120 mg / dl Electronic Signature(s) Signed: 06/06/2022 4:24:30 PM By: Rhae Hammock RN Entered By: Rhae Hammock on 06/01/2022 08:08:19

## 2022-06-06 NOTE — Progress Notes (Signed)
Shelby Walker, Shelby Walker ([id number]) [phone].pdf Page 1 of 4 Visit Report for 06/01/2022 Abuse Risk Screen Details Patient Name: Date of Service: Shelby Walker, Shelby Walker 06/01/2022 8:00 A M Medical Record Number: [id number] Patient Account Number: [id number] Date of Birth/Sex: Treating RN: 1933/09/01 (89 y.o. Shelby Walker, Shelby Walker Primary Care Shelby Walker: Shelby Walker Other Clinician: Referring Shelby Walker: Treating Shelby Walker: Shelby Walker in Treatment: 0 Abuse Risk Screen Items Answer ABUSE RISK SCREEN: Has anyone close to you tried to hurt or harm you recentlyo No Do you feel uncomfortable with anyone in your familyo No Has anyone forced you do things that you didnt want to doo No Electronic Signature(s) Signed: 06/06/2022 4:24:30 PM By: Rhae Hammock RN Entered By: Rhae Hammock on 06/01/2022 08:08:31 -------------------------------------------------------------------------------- Activities of Daily Living Details Patient Name: Date of Service: Shelby Walker, Shelby Walker 06/01/2022 8:00 A M Medical Record Number: [id number] Patient Account Number: [id number] Date of Birth/Sex: Treating RN: Apr 16, 1933 (89 y.o. Shelby Walker, Shelby Walker Primary Care Giovannina Mun: Shelby Walker Other Clinician: Referring Shelby Walker: Treating Shelby Walker/Extender: Shelby Walker in Treatment: 0 Activities of Daily Living Items Answer Activities of Daily Living (Please select one for each item) Drive Automobile Need Assistance T Medications ake Completely Able Use T elephone Completely Able Care for Appearance Completely Able Use T oilet Completely Able Bath / Shower Completely Able Dress Self Completely Able Feed Self Completely Able Walk Need Assistance Get In / Out Bed Completely [place] Need Assistance Shop for Self Need Assistance Electronic Signature(s) Signed: 06/06/2022  4:24:30 PM By: Rhae Hammock RN Entered By: Rhae Hammock on 06/01/2022 08:10:43 -------------------------------------------------------------------------------- Education Screening Details Patient Name: Date of Service: Shelby Walker. 06/01/2022 8:00 A M Medical Record Number: [id number] Patient Account Number: [id number] Date of Birth/Sex: Treating RN: Mar 04, 1934 (89 y.o. Shelby Walker, Shelby Walker Primary Care Lennix Rotundo: Shelby Walker Other Clinician: Referring Shelby Walker: Treating Shelby Walker/Extender: Shelby Walker in TreatmentAILIN, [name] ([id number]) [phone].pdf Page 2 of 4 Primary Learner Assessed: Patient Learning Preferences/Education Level/Primary Language Learning Preference: Explanation, Demonstration, Printed Material Highest Education Level: College or Above Preferred Language: Diplomatic Services operational officer Language Barrier: No Translator Needed: No Memory Deficit: No Emotional Barrier: No Cultural/Religious Beliefs Affecting Medical Care: No Physical Barrier Impaired Vision: Yes Glasses Impaired Hearing: No Decreased Hand dexterity: No Knowledge/Comprehension Knowledge Level: High Comprehension Level: High Ability to understand written instructions: High Ability to understand verbal instructions: High Motivation Anxiety Level: Calm Cooperation: Cooperative Education Importance: Denies Need Interest in Health Problems: Asks Questions Perception: Coherent Willingness to Engage in Self-Management High Activities: Readiness to Engage in Self-Management High Activities: Electronic Signature(s) Signed: 06/06/2022 4:24:30 PM By: Rhae Hammock RN Entered By: Rhae Hammock on 06/01/2022 08:11:07 -------------------------------------------------------------------------------- Fall Risk Assessment Details Patient Name: Date of Service: Shelby Walker. 06/01/2022 8:00 A M Medical Record Number:  [id number] Patient Account Number: [id number] Date of Birth/Sex: Treating RN: November 11, 1933 (89 y.o. Shelby Walker, Shelby Walker Primary Care Jarman Litton: Shelby Walker Other Clinician: Referring Romelo Sciandra: Treating Patrcia Schnepp/Extender: Shelby Walker in Treatment: 0 Fall Risk Assessment Items Have you had 2 or more falls in the last 12 monthso 0 No Have you had any fall that resulted in injury in the last 12 monthso 0 No FALLS RISK SCREEN History of falling - immediate or within 3 months 0 No Secondary diagnosis (Do you have 2 or more medical diagnoseso) 15 Yes Ambulatory aid None/bed rest/wheelchair/nurse 0 No Crutches/cane/walker 15 Yes Furniture  0 No Intravenous therapy Access/Saline/Heparin Lock 0 No Gait/Transferring Normal/ bed rest/ wheelchair 0 No Weak (short steps with or without shuffle, stooped but able to lift head while walking, may seek 0 No support from furniture) Impaired (short steps with shuffle, may have difficulty arising from chair, head down, impaired 0 No balance) Mental Status Oriented to own ability 0 No Overestimates or forgets limitations 0 No Risk Level: Medium Risk Score: 30 Shelby Walker, Shelby Walker ([id number]) [id number] Page 3 of 4 Electronic Signature(s) -------------------------------------------------------------------------------- Foot Assessment Details Patient Name: Date of Service: Shelby Walker, Shelby Walker 06/01/2022 8:00 A M Medical Record Number: [id number] Patient Account Number: [id number] Date of Birth/Sex: Treating RN: 31-Jul-1933 (89 y.o. Shelby Walker, Shelby Walker Primary Care Rosita Guzzetta: Shelby Walker Other Clinician: Referring Vianca Bracher: Treating Laureano Hetzer/Extender: Shelby Walker in Treatment: 0 Foot Assessment Items Site Locations + = Sensation present, - = Sensation absent, C = Callus, U = Ulcer R = Redness, W = Warmth, M = Maceration, PU = Pre-ulcerative lesion F = Fissure, S = Swelling, D  = Dryness Assessment Right: Left: Other Deformity: No No Prior Foot Ulcer: No No Prior Amputation: No No Charcot Joint: No No Ambulatory Status: Ambulatory With Help Assistance Device: Walker Gait: Steady Electronic Signature(s) Signed: 06/06/2022 4:24:30 PM By: Rhae Hammock RN Entered By: Rhae Hammock on 06/01/2022 08:18:03 -------------------------------------------------------------------------------- Nutrition Risk Screening Details Patient Name: Date of Service: Shelby Walker, Shelby Walker 06/01/2022 8:00 A M Medical Record Number: [id number] Patient Account Number: [id number] Date of Birth/Sex: Treating RN: [birth year] (89 y.o. Benjaman Lobe Primary Care Cynai Skeens: Shelby Walker Other Clinician: Referring Hurshel Bouillon: Treating Carleena Mires/Extender: Shelby Walker in Treatment: 0 Height (in): Weight (lbs): Body Mass Index (BMI): Shelby Walker, Shelby Walker ([id number]) [id number] Page 4 of 4 Nutrition Risk Screening Items Score Screening NUTRITION RISK SCREEN: I have an illness or condition that made me change the kind and/or amount of food I eat 0 No I eat fewer than two meals per day 0 No I eat few fruits and vegetables, or milk products 0 No I have three or more drinks of beer, liquor or wine almost every day 0 No I have tooth or mouth problems that make it hard for me to eat 0 No I don't always have enough money to buy the food I need 0 No I eat alone most of the time 0 No I take three or more different prescribed or over-the-counter drugs a day 0 No Without wanting to, I have lost or gained 10 pounds in the last six months 0 No I am not always physically able to shop, cook and/or feed myself 0 No Nutrition Protocols Good Risk Protocol 0 No interventions needed Moderate Risk Protocol High Risk Proctocol Risk Level: Good Risk Score: 0 Electronic Signature(s) Signed: 06/06/2022 4:24:30 PM By: Rhae Hammock RN Entered By:  Rhae Hammock on 06/01/2022 08:11:33

## 2022-06-07 ENCOUNTER — Encounter (HOSPITAL_BASED_OUTPATIENT_CLINIC_OR_DEPARTMENT_OTHER): Payer: Self-pay

## 2022-06-08 ENCOUNTER — Telehealth: Payer: Self-pay | Admitting: Podiatry

## 2022-06-08 NOTE — Telephone Encounter (Signed)
Please put in order for diabetic shoes and inserts , patient daughter is bringing her on 4/17 to be measured.

## 2022-06-11 ENCOUNTER — Encounter (HOSPITAL_BASED_OUTPATIENT_CLINIC_OR_DEPARTMENT_OTHER): Payer: Medicare Other | Attending: Internal Medicine | Admitting: Internal Medicine

## 2022-06-11 DIAGNOSIS — L89153 Pressure ulcer of sacral region, stage 3: Secondary | ICD-10-CM | POA: Insufficient documentation

## 2022-06-11 DIAGNOSIS — M069 Rheumatoid arthritis, unspecified: Secondary | ICD-10-CM | POA: Diagnosis not present

## 2022-06-11 DIAGNOSIS — E11622 Type 2 diabetes mellitus with other skin ulcer: Secondary | ICD-10-CM | POA: Diagnosis not present

## 2022-06-11 NOTE — Progress Notes (Signed)
PIA, JEDLICKA ([id number]) [id number] Page 1 of 8 Visit Report for 06/11/2022 Arrival Information Details Patient Name: Date of Service: Shelby Walker, Shelby Walker 06/11/2022 11:00 A M Medical Record Number: [id number] Patient Account Number: [id number] Date of Birth/Sex: Treating RN: 1933-12-30 (89 y.o. Arta Silence Primary Care Clista Rainford: Adrian Prince Other Clinician: Referring Demetries Coia: Treating Raea Magallon/Extender: Ignatius Specking in Treatment: 0 Visit Information History Since Last Visit Added or deleted any medications: No Patient Arrived: [name] Any new allergies or adverse reactions: No Arrival Time: 11:14 Had a fall or experienced change in No Accompanied By: granddaughter activities of daily living that may affect Transfer Assistance: None risk of falls: Patient Identification Verified: Yes Signs or symptoms of abuse/neglect since last visito No Secondary Verification Process Completed: Yes Hospitalized since last visit: No Patient Requires Transmission-Based Precautions: No Implantable device outside of the clinic excluding No Patient Has Alerts: No cellular tissue based products placed in the center since last visit: Has Dressing in Place as Prescribed: Yes Pain Present Now: Yes Electronic Signature(s) Signed: 06/11/2022 5:03:18 PM By: Shawn Stall RN, BSN Entered By: Shawn Stall on 06/11/2022 11:14:43 -------------------------------------------------------------------------------- Clinic Level of Care Assessment Details Patient Name: Date of Service: Shelby Walker, Shelby Walker 06/11/2022 11:00 A M Medical Record Number: [id number] Patient Account Number: [id number] Date of Birth/Sex: Treating RN: [birth year] (89 y.o. Debara Pickett, Yvonne Kendall Primary Care Caterina Racine: Adrian Prince Other Clinician: Referring Winferd Wease: Treating Reegan Mctighe/Extender: Ignatius Specking in Treatment: 0 Clinic Level of Care Assessment Items TOOL 3  Quantity Score X- 1 0 Use when EandM and Procedure is performed on FOLLOW-UP visit ASSESSMENTS - Nursing Assessment / Reassessment X- 1 10 Reassessment of Co-morbidities (includes updates in patient status) X- 1 5 Reassessment of Adherence to Treatment Plan ASSESSMENTS - Wound and Skin Assessment / Reassessment []  - Points for Wound Assessment can only be taken for a new wound of unknown or different etiology and a procedure is 0 NOT performed to that wound X- 1 5 Simple Wound Assessment / Reassessment - one wound []  - 0 Complex Wound Assessment / Reassessment - multiple wounds []  - 0 Dermatologic / Skin Assessment (not related to wound area) ASSESSMENTS - Focused Assessment []  - 0 Circumferential Edema Measurements - multi extremities []  - 0 Nutritional Assessment / Counseling / Intervention []  - 0 Lower Extremity Assessment (monofilament, tuning fork, pulses) []  - 0 Peripheral Arterial Disease Assessment (using hand held doppler) ASSESSMENTS - Ostomy and/or Continence Assessment and Care []  - 0 Incontinence Assessment and Management []  - 0 Ostomy Care Assessment and Management (repouching, etc.) Shelby Walker, Shelby Walker ([id number]) [id number] Page 2 of 8 PROCESS - Coordination of Care []  - Points for Discharge Coordination can only be taken for a new wound of unknown or different etiology and a procedure 0 is NOT performed to that wound X- 1 15 Simple Patient / Family Education for ongoing care []  - 0 Complex (extensive) Patient / Family Education for ongoing care []  - 0 Staff obtains Chiropractor, Records, T Results / Process Orders est []  - 0 Staff telephones HHA, Nursing Homes / Clarify orders / etc []  - 0 Routine Transfer to another Facility (non-emergent condition) []  - 0 Routine Hospital Admission (non-emergent condition) []  - 0 New Admissions / Manufacturing engineer / Ordering NPWT Apligraf, etc. , []  - 0 Emergency Hospital Admission  (emergent condition) X- 1 10 Simple Discharge Coordination []  - 0 Complex (extensive) Discharge Coordination PROCESS - Special Needs []  - 0 Pediatric / Minor Patient  Management []  - 0 Isolation Patient Management []  - 0 Hearing / Language / Visual special needs []  - 0 Assessment of Community assistance (transportation, D/C planning, etc.) []  - 0 Additional assistance / Altered mentation []  - 0 Support Surface(s) Assessment (bed, cushion, seat, etc.) INTERVENTIONS - Wound Cleansing / Measurement []  - Points for Wound Cleaning / Measurement, Wound Dressing, Specimen Collection and Specimen taken to lab can only 0 be taken for a new wound of unknown or different etiology and a procedure is NOT performed to that wound X- 1 5 Simple Wound Cleansing - one wound []  - 0 Complex Wound Cleansing - multiple wounds X- 1 5 Wound Imaging (photographs - any number of wounds) []  - 0 Wound Tracing (instead of photographs) X- 1 5 Simple Wound Measurement - one wound []  - 0 Complex Wound Measurement - multiple wounds INTERVENTIONS - Wound Dressings X - Small Wound Dressing one or multiple wounds 1 10 []  - 0 Medium Wound Dressing one or multiple wounds []  - 0 Large Wound Dressing one or multiple wounds INTERVENTIONS - Miscellaneous []  - 0 External ear exam []  - 0 Specimen Collection (cultures, biopsies, blood, body fluids, etc.) []  - 0 Specimen(s) / Culture(s) sent or taken to Lab for analysis []  - 0 Patient Transfer (multiple staff / Nurse, adult / Similar devices) []  - 0 Simple Staple / Suture removal (25 or less) []  - 0 Complex Staple / Suture removal (26 or more) []  - 0 Hypo / Hyperglycemic Management (close monitor of Blood Glucose) []  - 0 Ankle / Brachial Index (ABI) - do not check if billed separately X- 1 5 Vital Signs Has the patient been seen at the hospital within the last three years: Yes Total Score: 75 Level Of Care: New/Established - Level 2 Shelby Walker, Shelby Walker  ([id number]) [id number] Page 3 of 8 Electronic Signature(s) Signed: 06/11/2022 5:03:18 PM By: Shawn Stall RN, BSN Entered By: Shawn Stall on 06/11/2022 11:33:46 -------------------------------------------------------------------------------- Encounter Discharge Information Details Patient Name: Date of Service: Shelby Walker, Shelby Walker 06/11/2022 11:00 A M Medical Record Number: [id number] Patient Account Number: [id number] Date of Birth/Sex: Treating RN: 11-13-1933 (89 y.o. Arta Silence Primary Care Hymen Arnett: Adrian Prince Other Clinician: Referring Darrel Baroni: Treating Graves Nipp/Extender: Ignatius Specking in Treatment: 0 Encounter Discharge Information Items Post Procedure Vitals Discharge Condition: Stable Temperature (F): 98.9 Ambulatory Status: Walker Pulse (bpm): 84 Discharge Destination: Home Respiratory Rate (breaths/min): 20 Transportation: Private Auto Blood Pressure (mmHg): 173/64 Accompanied By: granddaughter Schedule Follow-up Appointment: Yes Clinical Summary of Care: Electronic Signature(s) Signed: 06/11/2022 5:03:18 PM By: Shawn Stall RN, BSN Entered By: Shawn Stall on 06/11/2022 11:34:28 -------------------------------------------------------------------------------- Lower Extremity Assessment Details Patient Name: Date of Service: Shelby Walker, Shelby Walker 06/11/2022 11:00 A M Medical Record Number: [id number] Patient Account Number: [id number] Date of Birth/Sex: Treating RN: 04/30/1933 (89 y.o. Arta Silence Primary Care Bert Givans: Adrian Prince Other Clinician: Referring Cariann Kinnamon: Treating Angelyn Osterberg/Extender: Ignatius Specking in Treatment: 0 Electronic Signature(s) Signed: 06/11/2022 5:03:18 PM By: Shawn Stall RN, BSN Entered By: Shawn Stall on 06/11/2022 11:15:26 -------------------------------------------------------------------------------- Multi Wound Chart Details Patient Name: Date of  Service: Shelby Rings. 06/11/2022 11:00 A M Medical Record Number: [id number] Patient Account Number: [id number] Date of Birth/Sex: Treating RN: 04/18/1933 (89 y.o. F) Primary Care Jordanny Waddington: Adrian Prince Other Clinician: Referring Lexie Morini: Treating Adham Johnson/Extender: Ignatius Specking in Treatment: 0 Vital Signs Height(in): Pulse(bpm): 84 Weight(lbs): Blood Pressure(mmHg): 173/64 Body Mass Index(BMI): Temperature(F): 98.9 Respiratory Rate(breaths/min): 20 [1:Photos:] [N/A:N/A]  Left Sacrum N/A N/A Wound Location: Gradually Appeared N/A N/A Wounding Event: Abrasion N/A N/A Primary Etiology: 06/07/2022 N/A N/A Date Acquired: 0 N/A N/A Weeks of Treatment: Open N/A N/A Wound Status: No N/A N/A Wound Recurrence: 0.8x0.6x0.1 N/A N/A Measurements L x W x D (cm) 0.377 N/A N/A A (cm) : rea 0.038 N/A N/A Volume (cm) : Full Thickness Without Exposed N/A N/A Classification: Support Structures Medium N/A N/A Exudate A mount: Serosanguineous N/A N/A Exudate Type: red, brown N/A N/A Exudate Color: Distinct, outline attached N/A N/A Wound Margin: Medium (34-66%) N/A N/A Granulation A mount: Red, Pink N/A N/A Granulation Quality: Medium (34-66%) N/A N/A Necrotic A mount: Fat Layer (Subcutaneous Tissue): Yes N/A N/A Exposed Structures: Fascia: No Tendon: No Muscle: No Joint: No Bone: No Small (1-33%) N/A N/A Epithelialization: Chemical/Enzymatic/Mechanical N/A N/A Debridement: N/A N/A N/A Instrument: None N/A N/A Bleeding: Debridement Treatment Response: Procedure was tolerated well N/A N/A Post Debridement Measurements L x 0.8x0.6x0.1 N/A N/A W x D (cm) 0.038 N/A N/A Post Debridement Volume: (cm) Excoriation: No N/A N/A Periwound Skin Texture: Induration: No Callus: No Crepitus: No Rash: No Scarring: No Maceration: No N/A N/A Periwound Skin Moisture: Dry/Scaly: No Atrophie Blanche: No N/A N/A Periwound Skin Color: Cyanosis:  No Ecchymosis: No Erythema: No Hemosiderin Staining: No Mottled: No Pallor: No Rubor: No Debridement N/A N/A Procedures Performed: Treatment Notes Wound #1 (Sacrum) Wound Laterality: Left Cleanser Soap and Water Discharge Instruction: May shower and wash wound with dial antibacterial soap and water prior to dressing change. Wound Cleanser Discharge Instruction: Cleanse the wound with wound cleanser prior to applying a clean dressing using gauze sponges, not tissue or cotton balls. Byram Ancillary Kit - 15 Day Supply Discharge Instruction: Use supplies as instructed; Kit contains: (15) Saline Bullets; (15) 3x3 Gauze; 15 pr Gloves Peri-Wound Care Skin Prep Discharge Instruction: Use skin prep as directed Topical Primary Dressing Hydrofera Blue Ready Transfer Foam, 2.5x2.5 (in/in) Discharge Instruction: Apply directly to wound bed as directed MediHoney Gel, tube 1.5 (oz) Discharge Instruction: Apply to wound bed as instructed Secondary Dressing Zetuvit Plus Silicone [address] D[address] 4x4 (in/inDespina Arias) Thedford, Sherrin P ([id number]) [id number] Page 5 of 8 Discharge Instruction: Apply silicone border over primary dressing as directed. Secured With Compression Wrap Compression Stockings Add-Ons Electronic Signature(s) Signed: 06/11/2022 1:25:17 PM By: Geralyn CorwinHoffman, Jessica DO Entered By: Geralyn CorwinHoffman, Jessica on 06/11/2022 11:36:09 -------------------------------------------------------------------------------- Multi-Disciplinary Care Plan Details Patient Name: Date of Service: Shelby RingsKIGER, Shelby Y P. 06/11/2022 11:00 A M Medical Record Number: [id number] Patient Account Number: [id number] Date of Birth/Sex: Treating RN: 07-14-1933 (89 y.o. Arta SilenceF) Deaton, Bobbi Primary Care Nechuma Boven: Adrian PrinceSouth, Stephen Other Clinician: Referring Zanylah Hardie: Treating Rolla Servidio/Extender: Ignatius SpeckingHoffman, Jessica South, Stephen Weeks in Treatment: 0 Active Inactive Pain, Acute or Chronic Nursing Diagnoses: Pain, acute  or chronic: actual or potential Potential alteration in comfort, pain Goals: Patient will verbalize adequate pain control and receive pain control interventions during procedures as needed Date Initiated: 06/11/2022 Target Resolution Date: 07/12/2022 Goal Status: Active Interventions: Encourage patient to take pain medications as prescribed Provide education on pain management Reposition patient for comfort Treatment Activities: Administer pain control measures as ordered : 06/11/2022 Notes: Wound/Skin Impairment Nursing Diagnoses: Knowledge deficit related to ulceration/compromised skin integrity Goals: Patient/caregiver will verbalize understanding of skin care regimen Date Initiated: 06/11/2022 Target Resolution Date: 07/13/2022 Goal Status: Active Interventions: Assess patient/caregiver ability to perform ulcer/skin care regimen upon admission and as needed Assess ulceration(s) every visit Provide education on ulcer and skin care Treatment Activities: Skin care regimen initiated : 06/11/2022  Topical wound management initiated : 06/11/2022 Notes: Electronic Signature(s) Signed: 06/11/2022 5:03:18 PM By: Shawn Stall RN, BSN Shelby Walker, Algernon Huxley ([id number]) [id number] Page 6 of 8 Entered By: Shawn Stall on 06/11/2022 11:28:32 -------------------------------------------------------------------------------- Pain Assessment Details Patient Name: Date of Service: Shelby Walker, Shelby Walker 06/11/2022 11:00 A M Medical Record Number: [id number] Patient Account Number: [id number] Date of Birth/Sex: Treating RN: 18-May-1933 (89 y.o. Arta Silence Primary Care Shizuko Wojdyla: Adrian Prince Other Clinician: Referring Lupe Bonner: Treating Aster Eckrich/Extender: Ignatius Specking in Treatment: 0 Active Problems Location of Pain Severity and Description of Pain Patient Has Paino Yes Site Locations Pain Location: Generalized Pain, Pain in Ulcers Rate the pain. Current Pain  Level: 4 Pain Management and Medication Current Pain Management: Notes Per patient just below area hurts. Electronic Signature(s) Signed: 06/11/2022 5:03:18 PM By: Shawn Stall RN, BSN Entered By: Shawn Stall on 06/11/2022 11:15:22 -------------------------------------------------------------------------------- Patient/Caregiver Education Details Patient Name: Date of Service: Shelby Walker, Shelby Y P. 4/8/2024andnbsp11:00 A M Medical Record Number: [id number] Patient Account Number: [id number] Date of Birth/Gender: Treating RN: 12-18-1933 (89 y.o. Arta Silence Primary Care Physician: Adrian Prince Other Clinician: Referring Physician: Treating Physician/Extender: Ignatius Specking in Treatment: 0 Education Assessment Education Provided To: Patient Education Topics Provided Wound/Skin Impairment: Handouts: Caring for Your Ulcer Methods: Explain/Verbal Responses: Reinforcements needed Shelby Walker, Shelby Walker ([id number]) [id number] Page 7 of 8 Electronic Signature(s) Signed: 06/11/2022 5:03:18 PM By: Shawn Stall RN, BSN Entered By: Shawn Stall on 06/11/2022 11:28:48 -------------------------------------------------------------------------------- Wound Assessment Details Patient Name: Date of Service: Shelby Walker, Shelby Walker 06/11/2022 11:00 A M Medical Record Number: [id number] Patient Account Number: [id number] Date of Birth/Sex: Treating RN: 03-Jan-1934 (89 y.o. Debara Pickett, Yvonne Kendall Primary Care Tamecia Mcdougald: Adrian Prince Other Clinician: Referring Porchia Sinkler: Treating Raphael Fitzpatrick/Extender: Ignatius Specking in Treatment: 0 Wound Status Wound Number: 1 Primary Etiology: Pressure Ulcer Wound Location: Left Sacrum Wound Status: Open Wounding Event: Gradually Appeared Comorbid History: Type II Diabetes, Osteoarthritis Date Acquired: 06/07/2022 Weeks Of Treatment: 0 Clustered Wound: No Photos Wound Measurements Length: (cm) 0.8 Width: (cm)  0.6 Depth: (cm) 0.1 Area: (cm) 0.377 Volume: (cm) 0.038 % Reduction in Area: % Reduction in Volume: Epithelialization: Small (1-33%) Tunneling: No Undermining: No Wound Description Classification: Category/Stage III Wound Margin: Distinct, outline attached Exudate Amount: Medium Exudate Type: Serosanguineous Exudate Color: red, brown Foul Odor After Cleansing: No Slough/Fibrino Yes Wound Bed Granulation Amount: Medium (34-66%) Exposed Structure Granulation Quality: Red, Pink Fascia Exposed: No Necrotic Amount: Medium (34-66%) Fat Layer (Subcutaneous Tissue) Exposed: Yes Necrotic Quality: Adherent Slough Tendon Exposed: No Muscle Exposed: No Joint Exposed: No Bone Exposed: No Periwound Skin Texture Texture Color No Abnormalities Noted: No No Abnormalities Noted: No Callus: No Atrophie Blanche: No Crepitus: No Cyanosis: No Excoriation: No Ecchymosis: No Induration: No Erythema: No Rash: No Hemosiderin Staining: No Scarring: No Mottled: No Pallor: No Moisture Rubor: No No Abnormalities Noted: No Dry / Scaly: No DESHEA, POOLEY ([id number]) [id number] Page 8 of 8 Maceration: No Treatment Notes Wound #1 (Sacrum) Wound Laterality: Left Cleanser Soap and Water Discharge Instruction: May shower and wash wound with dial antibacterial soap and water prior to dressing change. Wound Cleanser Discharge Instruction: Cleanse the wound with wound cleanser prior to applying a clean dressing using gauze sponges, not tissue or cotton balls. Byram Ancillary Kit - 15 Day Supply Discharge Instruction: Use supplies as instructed; Kit contains: (15) Saline Bullets; (15) 3x3 Gauze; 15 pr Gloves Peri-Wound Care Skin Prep Discharge Instruction: Use skin prep as directed Topical Primary  Dressing Hydrofera Blue Ready Transfer Foam, 2.5x2.5 (in/in) Discharge Instruction: Apply directly to wound bed as directed MediHoney Gel, tube 1.5 (oz) Discharge  Instruction: Apply to wound bed as instructed Secondary Dressing Zetuvit Plus Silicone Border Dressing 4x4 (in/in) Discharge Instruction: Apply silicone border over primary dressing as directed. Secured With Compression Wrap Compression Stockings Facilities manager) Signed: 06/11/2022 1:25:17 PM By: Geralyn Corwin DO Signed: 06/11/2022 5:03:18 PM By: Shawn Stall RN, BSN Entered By: Geralyn Corwin on 06/11/2022 11:40:33 -------------------------------------------------------------------------------- Vitals Details Patient Name: Date of Service: Shelby Rings. 06/11/2022 11:00 A M Medical Record Number: [id number] Patient Account Number: [id number] Date of Birth/Sex: Treating RN: [birth year] (89 y.o. Arta Silence Primary Care Gabrelle Roca: Adrian Prince Other Clinician: Referring Cionna Collantes: Treating Srihari Shellhammer/Extender: Ignatius Specking in Treatment: 0 Vital Signs Time Taken: 11:14 Temperature (F): 98.9 Pulse (bpm): 84 Respiratory Rate (breaths/min): 20 Blood Pressure (mmHg): 173/64 Reference Range: 80 - 120 mg / dl Electronic Signature(s) Signed: 06/11/2022 5:03:18 PM By: Shawn Stall RN, BSN Entered By: Shawn Stall on 06/11/2022 11:15:00

## 2022-06-11 NOTE — Progress Notes (Signed)
Shelby Walker ([id number]) [id number] Page 1 of 7 Visit Report for 06/11/2022 Chief Complaint Document Details Patient Name: Date of Service: Shelby Walker, Shelby Walker 06/11/2022 11:00 A M Medical Record Number: [id number] Patient Account Number: [id number] Date of Birth/Sex: Treating RN: April 16, 1933 (88 y.o. F) Primary Care Provider: Adrian Prince Other Clinician: Referring Provider: Treating Provider/Extender: Ignatius Specking in Treatment: 0 Information Obtained from: Patient Chief Complaint 06/01/2022; previous history of sacral ulcer and bilateral feet wounds. Electronic Signature(s) Signed: 06/11/2022 1:25:17 PM By: Geralyn Corwin DO Entered By: Geralyn Corwin on 06/11/2022 11:36:18 -------------------------------------------------------------------------------- Debridement Details Patient Name: Date of Service: Shelby Walker. 06/11/2022 11:00 A M Medical Record Number: [id number] Patient Account Number: [id number] Date of Birth/Sex: Treating RN: 1933/06/24 (89 y.o. Shelby Walker Primary Care Provider: Adrian Prince Other Clinician: Referring Provider: Treating Provider/Extender: Ignatius Specking in Treatment: 0 Debridement Performed for Assessment: Wound #1 Left Sacrum Performed By: Clinician Shawn Stall, RN Debridement Type: Chemical/Enzymatic/Mechanical Agent Used: gauze and wound cleanser Level of Consciousness (Pre-procedure): Awake and Alert Pre-procedure Verification/Time Out No Taken: Bleeding: None Response to Treatment: Procedure was tolerated well Level of Consciousness (Post- Awake and Alert procedure): Post Debridement Measurements of Total Wound Length: (cm) 0.8 Width: (cm) 0.6 Depth: (cm) 0.1 Volume: (cm) 0.038 Character of Wound/Ulcer Post Debridement: Stable Post Procedure Diagnosis Same as Pre-procedure Electronic Signature(s) Signed: 06/11/2022 1:25:17 PM By: Geralyn Corwin DO Signed:  06/11/2022 5:03:18 PM By: Shawn Stall RN, BSN Entered By: Shawn Stall on 06/11/2022 11:30:35 -------------------------------------------------------------------------------- HPI Details Patient Name: Date of Service: Shelby Walker. 06/11/2022 11:00 A M Medical Record Number: [id number] Patient Account Number: [id number] Date of Birth/Sex: Treating RN: 10/27/1933 (89 y.o. F) Primary Care Provider: Adrian Prince Other Clinician: Referring Provider: Treating Provider/Extender: Ignatius Specking in TreatmentTANISIA, Shelby Walker ([id number]) [id number] Page 2 of 7 History of Present Illness HPI Description: 06/01/2022 [name] is an 89 year old female with a past medical history of type 2 diabetes with previous transmetatarsal of the right foot, rheumatoid arthritis that presents to the clinic for concern of wounds to her sacrum and bilateral feet. She was referred by infectious disease and orthopedic surgery for these issues. Daughter is present and helps provide the history. She states that 6 weeks ago she developed pressure injuries and superficial wounds to her feet. She was treated with a long course of antibiotics Due to concern for infection. She has been using Silvadene cream to the areas. She is also been using bunny boots. Over the past several weeks the wounds have healed. She also developed a sore to her sacrum 2 to 3 weeks ago. She has been keeping the area clean. This area is also healed. She currently denies signs of infection. 4/8; patient was last seen 1 week ago as a consult for her previous left foot wound that was healed. She had a stage I pressure ulcer to her sacrum at that time. Unfortunately this has progressed. She sits for long periods of time in her recliner and couch throughout the day. She is able to ambulate with a walker however it does not appear to be doing very much outside of going to the bathroom and going to the  kitchen to eat. She currently denies signs of infection. Electronic Signature(s) Signed: 06/11/2022 1:25:17 PM By: Geralyn Corwin DO Entered By: Geralyn Corwin on 06/11/2022 11:37:21 -------------------------------------------------------------------------------- Physical Exam Details Patient Name: Date of Service: Shelby Walker. 06/11/2022 11:00 A M  Medical Record Number: [id number] Patient Account Number: [id number] Date of Birth/Sex: Treating RN: 29-Sep-1933 (89 y.o. F) Primary Care Provider: Adrian Prince Other Clinician: Referring Provider: Treating Provider/Extender: Ignatius Specking in Treatment: 0 Constitutional respirations regular, non-labored and within target range for patient.Marland Kitchen Psychiatric pleasant and cooperative. Notes T the sacrum there is a small open wound with dried nonviable tissue throughout o Electronic Signature(s) Signed: 06/11/2022 1:25:17 PM By: Geralyn Corwin DO Entered By: Geralyn Corwin on 06/11/2022 11:38:23 -------------------------------------------------------------------------------- Physician Orders Details Patient Name: Date of Service: Shelby Walker. 06/11/2022 11:00 A M Medical Record Number: [id number] Patient Account Number: [id number] Date of Birth/Sex: Treating RN: 1933-03-31 (89 y.o. Shelby Walker Primary Care Provider: Adrian Prince Other Clinician: Referring Provider: Treating Provider/Extender: Ignatius Specking in Treatment: 0 Verbal / Phone Orders: No Diagnosis Coding Follow-up Appointments ppointment in 1 week. - Dr. Mikey Walker 06/18/2022 1015 room 7 Return A Other: - [facility] wound care supplier Anesthetic (In clinic) Topical Lidocaine 4% applied to wound bed Bathing/ Shower/ Hygiene May shower and wash wound with soap and water. Off-Loading Turn and reposition every 2 hours Other: - ensure to get up and walk every hour to aid in offloading pressure to buttock. Wound  Treatment Shelby Walker, Shelby Walker ([id number]) [id number] Page 3 of 7 Wound #1 - Sacrum Wound Laterality: Left Cleanser: Soap and Water 1 x Per Day/30 Days Discharge Instructions: May shower and wash wound with dial antibacterial soap and water prior to dressing change. Cleanser: Wound Cleanser 1 x Per Day/30 Days Discharge Instructions: Cleanse the wound with wound cleanser prior to applying a clean dressing using gauze sponges, not tissue or cotton balls. Cleanser: [facility] Ancillary Kit - 15 Day Supply (DME) (Generic) 1 x Per Day/30 Days Discharge Instructions: Use supplies as instructed; Kit contains: (15) Saline Bullets; (15) 3x3 Gauze; 15 pr Gloves Peri-Wound Care: Skin Prep (DME) (Generic) 1 x Per Day/30 Days Discharge Instructions: Use skin prep as directed Prim Dressing: Hydrofera Blue Ready Transfer Foam, 2.5x2.5 (in/in) 1 x Per Day/30 Days ary Discharge Instructions: Apply directly to wound bed as directed Prim Dressing: MediHoney Gel, tube 1.5 (oz) 1 x Per Day/30 Days ary Discharge Instructions: Apply to wound bed as instructed Secondary Dressing: Zetuvit Plus Silicone Border Dressing 4x4 (in/in) (DME) (Generic) 1 x Per Day/30 Days Discharge Instructions: Apply silicone border over primary dressing as directed. Patient Medications llergies: cholestyramine, Compazine, Sulfa (Sulfonamide Antibiotics), hydrocodone, infliximab, leflunomide, prochlorperazine, rosuvastatin A Notifications Medication Indication Start End in clinic only for 06/11/2022 lidocaine debridements. DOSE topical 4 % cream - cream topical once daily Electronic Signature(s) Signed: 06/11/2022 1:25:17 PM By: Geralyn Corwin DO Entered By: Geralyn Corwin on 06/11/2022 11:38:31 -------------------------------------------------------------------------------- Problem List Details Patient Name: Date of Service: Shelby Walker. 06/11/2022 11:00 A M Medical Record Number: [id number] Patient Account  Number: [id number] Date of Birth/Sex: Treating RN: 11/01/1933 (89 y.o. F) Primary Care Provider: Adrian Prince Other Clinician: Referring Provider: Treating Provider/Extender: Ignatius Specking in Treatment: 0 Active Problems ICD-10 Encounter Code Description Active Date MDM Diagnosis L89.153 Pressure ulcer of sacral region, stage 3 06/11/2022 No Yes E11.622 Type 2 diabetes mellitus with other skin ulcer 06/11/2022 No Yes M06.9 Rheumatoid arthritis, unspecified 06/11/2022 No Yes Inactive Problems Resolved Problems Electronic Signature(s) Signed: 06/11/2022 1:25:17 PM By: Rosine Door, Algernon Huxley ([id number]) [id number] Page 4 of 7 Signed: 06/11/2022 1:25:17 PM By: Geralyn Corwin DO Entered By: Geralyn Corwin on 06/11/2022 11:36:05 -------------------------------------------------------------------------------- Progress Note Details Patient Name: Date  of Service: Shelby RingsKIGER, Shelby Walker. 06/11/2022 11:00 A M Medical Record Number: [id number] Patient Account Number: [id number] Date of Birth/Sex: Treating RN: Nov 26, 1933 (89 y.o. F) Primary Care Provider: Adrian PrinceSouth, Stephen Other Clinician: Referring Provider: Treating Provider/Extender: Ignatius SpeckingHoffman, Kemara Quigley South, Stephen Weeks in Treatment: 0 Subjective Chief Complaint Information obtained from Patient 06/01/2022; previous history of sacral ulcer and bilateral feet wounds. History of Present Illness (HPI) 06/01/2022 Ms. Shelby ReamsKay Marsan is an 89 year old female with a past medical history of type 2 diabetes with previous transmetatarsal of the right foot, rheumatoid arthritis that presents to the clinic for concern of wounds to her sacrum and bilateral feet. She was referred by infectious disease and orthopedic surgery for these issues. Daughter is present and helps provide the history. She states that 6 weeks ago she developed pressure injuries and superficial wounds to her feet. She was treated with a  long course of antibiotics Due to concern for infection. She has been using Silvadene cream to the areas. She is also been using bunny boots. Over the past several weeks the wounds have healed. She also developed a sore to her sacrum 2 to 3 weeks ago. She has been keeping the area clean. This area is also healed. She currently denies signs of infection. 4/8; patient was last seen 1 week ago as a consult for her previous left foot wound that was healed. She had a stage I pressure ulcer to her sacrum at that time. Unfortunately this has progressed. She sits for long periods of time in her recliner and couch throughout the day. She is able to ambulate with a walker however it does not appear to be doing very much outside of going to the bathroom and going to the kitchen to eat. She currently denies signs of infection. Patient History Information obtained from Patient, Chart. Family History Unknown History. Social History Never smoker, Marital Status - Widowed, Alcohol Use - Never, Drug Use - No History, Caffeine Use - Rarely. Medical History Endocrine Patient has history of Type II Diabetes Musculoskeletal Patient has history of Osteoarthritis Hospitalization/Surgery History - Ppm generator removal. - tee without conversion. - pacemaker implaint. - loop recorder removal. - transmet. amp. right. - abdominal aortogram w/ lower extremity right. - fracture surgery. Medical A Surgical History Notes nd Cardiovascular mitral regurg., bruit, chest pain,unspecified, dyslipidemia, ejection fraction 55-60 % , palpitations Objective Constitutional respirations regular, non-labored and within target range for patient.. Vitals Time Taken: 11:14 AM, Temperature: 98.9 F, Pulse: 84 bpm, Respiratory Rate: 20 breaths/min, Blood Pressure: 173/64 mmHg. Psychiatric pleasant and cooperative. General Notes: T the sacrum there is a small open wound with dried nonviable tissue throughout o Integumentary (Hair,  Skin) Wound #1 status is Open. Original cause of wound was Gradually Appeared. The date acquired was: 06/07/2022. The wound is located on the Left Sacrum. The wound measures 0.8cm length x 0.6cm width x 0.1cm depth; 0.377cm^2 area and 0.038cm^3 volume. There is Fat Layer (Subcutaneous Tissue) exposed. There is no tunneling or undermining noted. There is a medium amount of serosanguineous drainage noted. The wound margin is distinct with the outline attached to the wound base. There is medium (34-66%) red, pink granulation within the wound bed. There is a medium (34-66%) amount of necrotic tissue within Shelby AriasKIGER, Shelby Walker ([id number]) [id number] Page 5 of 7 the wound bed including Adherent Slough. The periwound skin appearance did not exhibit: Callus, Crepitus, Excoriation, Induration, Rash, Scarring, Dry/Scaly, Maceration, Atrophie Blanche, Cyanosis, Ecchymosis, Hemosiderin Staining, Mottled, Pallor, Rubor, Erythema. Assessment Active Problems ICD-10  Pressure ulcer of sacral region, stage 3 Type 2 diabetes mellitus with other skin ulcer Rheumatoid arthritis, unspecified Patient presents with a stage III sacral ulcer. I recommended Medihoney and Hydrofera Blue. We had a long discussion about the importance of aggressive offloading for her wound healing. She expressed understanding. Follow-up in 1 week. Procedures Wound #1 Pre-procedure diagnosis of Wound #1 is an Abrasion located on the Left Sacrum . There was a Chemical/Enzymatic/Mechanical debridement performed by Shawn Stall, RN.. Other agent used was gauze and wound cleanser. There was no bleeding. The procedure was tolerated well. Post Debridement Measurements: 0.8cm length x 0.6cm width x 0.1cm depth; 0.038cm^3 volume. Character of Wound/Ulcer Post Debridement is stable. Post procedure Diagnosis Wound #1: Same as Pre-Procedure Plan Follow-up Appointments: Return Appointment in 1 week. - Dr. Mikey Walker 06/18/2022 1015  room 7 Other: - [facility] wound care supplier Anesthetic: (In clinic) Topical Lidocaine 4% applied to wound bed Bathing/ Shower/ Hygiene: May shower and wash wound with soap and water. Off-Loading: Turn and reposition every 2 hours Other: - ensure to get up and walk every hour to aid in offloading pressure to buttock. The following medication(s) was prescribed: lidocaine topical 4 % cream cream topical once daily for in clinic only for debridements. was prescribed at facility WOUND #1: - Sacrum Wound Laterality: Left Cleanser: Soap and Water 1 x Per Day/30 Days Discharge Instructions: May shower and wash wound with dial antibacterial soap and water prior to dressing change. Cleanser: Wound Cleanser 1 x Per Day/30 Days Discharge Instructions: Cleanse the wound with wound cleanser prior to applying a clean dressing using gauze sponges, not tissue or cotton balls. Cleanser: [facility] Ancillary Kit - 15 Day Supply (DME) (Generic) 1 x Per Day/30 Days Discharge Instructions: Use supplies as instructed; Kit contains: (15) Saline Bullets; (15) 3x3 Gauze; 15 pr Gloves Peri-Wound Care: Skin Prep (DME) (Generic) 1 x Per Day/30 Days Discharge Instructions: Use skin prep as directed Prim Dressing: Hydrofera Blue Ready Transfer Foam, 2.5x2.5 (in/in) 1 x Per Day/30 Days ary Discharge Instructions: Apply directly to wound bed as directed Prim Dressing: MediHoney Gel, tube 1.5 (oz) 1 x Per Day/30 Days ary Discharge Instructions: Apply to wound bed as instructed Secondary Dressing: Zetuvit Plus Silicone Border Dressing 4x4 (in/in) (DME) (Generic) 1 x Per Day/30 Days Discharge Instructions: Apply silicone border over primary dressing as directed. 1. Hydrofera Blue and Medihoney 2. Aggressive offloadingooreposition every 1-2 hours 3. Follow-up in 1 week Electronic Signature(s) Signed: 06/11/2022 1:25:17 PM By: Geralyn Corwin DO Entered By: Geralyn Corwin on 06/11/2022 11:40:49 HxROS  Details -------------------------------------------------------------------------------- Shelby Walker ([id number]) [id number] Page 6 of 7 Patient Name: Date of Service: Shelby Walker, Shelby Walker 06/11/2022 11:00 A M Medical Record Number: [id number] Patient Account Number: [id number] Date of Birth/Sex: Treating RN: 1933/07/06 (89 y.o. F) Primary Care Provider: Adrian Prince Other Clinician: Referring Provider: Treating Provider/Extender: Ignatius Specking in Treatment: 0 Information Obtained From Patient Chart Cardiovascular Medical History: Past Medical History Notes: mitral regurg., bruit, chest pain,unspecified, dyslipidemia, ejection fraction 55-60 % , palpitations Endocrine Medical History: Positive for: Type II Diabetes Musculoskeletal Medical History: Positive for: Osteoarthritis Immunizations Pneumococcal Vaccine: Received Pneumococcal Vaccination: No Implantable Devices None Hospitalization / Surgery History Type of Hospitalization/Surgery Ppm generator removal tee without conversion pacemaker implaint loop recorder removal transmet. amp. right abdominal aortogram w/ lower extremity right fracture surgery Family and Social History Unknown History: Yes; Never smoker; Marital Status - Widowed; Alcohol Use: Never; Drug Use: No History; Caffeine Use: Rarely; Financial Concerns: No;  Food, [employer] fast streamer or Shelter Needs: No; Support System Lacking: No; Transportation Concerns: No Electronic Signature(s) Signed: 06/11/2022 1:25:17 PM By: Geralyn Corwin DO Entered By: Geralyn Corwin on 06/11/2022 11:37:52 -------------------------------------------------------------------------------- SuperBill Details Patient Name: Date of Service: Shelby Walker 06/11/2022 Medical Record Number: [id number] Patient Account Number: [id number] Date of Birth/Sex: Treating RN: March 20, 1933 (89 y.o. Shelby Walker Primary Care Provider: Adrian Prince Other  Clinician: Referring Provider: Treating Provider/Extender: Ignatius Specking in Treatment: 0 Diagnosis Coding ICD-10 Codes Code Description [phone] Pressure ulcer of sacral region, stage 3 E11.622 Type 2 diabetes mellitus with other skin ulcer M06.9 Rheumatoid arthritis, unspecified KEYLEEN, CERRATO ([id number]) [id number] Page 7 of 7 Facility Procedures : CPT4 Code: [id number] Description: [phone] - WOUND CARE VISIT-LEV 2 EST PT Modifier: Quantity: 1 : CPT4 Code: [id number] Description: 02725 - DEBRIDE W/O ANES NON SELECT Modifier: Quantity: 1 Physician Procedures : CPT4 Code Description Modifier [id number] - WC PHYS LEVEL 3 - EST PT ICD-10 Diagnosis Description L89.153 Pressure ulcer of sacral region, stage 3 E11.622 Type 2 diabetes mellitus with other skin ulcer M06.9 Rheumatoid arthritis, unspecified Quantity: 1 Electronic Signature(s) Signed: 06/11/2022 1:25:17 PM By: Geralyn Corwin DO Entered By: Geralyn Corwin on 06/11/2022 11:40:55

## 2022-06-12 ENCOUNTER — Encounter (HOSPITAL_BASED_OUTPATIENT_CLINIC_OR_DEPARTMENT_OTHER): Payer: Medicare Other | Admitting: General Surgery

## 2022-06-15 DIAGNOSIS — M7989 Other specified soft tissue disorders: Secondary | ICD-10-CM | POA: Diagnosis not present

## 2022-06-15 DIAGNOSIS — E1142 Type 2 diabetes mellitus with diabetic polyneuropathy: Secondary | ICD-10-CM | POA: Diagnosis not present

## 2022-06-15 DIAGNOSIS — E11621 Type 2 diabetes mellitus with foot ulcer: Secondary | ICD-10-CM | POA: Diagnosis not present

## 2022-06-15 DIAGNOSIS — L97519 Non-pressure chronic ulcer of other part of right foot with unspecified severity: Secondary | ICD-10-CM | POA: Diagnosis not present

## 2022-06-18 ENCOUNTER — Encounter (HOSPITAL_BASED_OUTPATIENT_CLINIC_OR_DEPARTMENT_OTHER): Payer: Medicare Other | Admitting: Internal Medicine

## 2022-06-18 DIAGNOSIS — M069 Rheumatoid arthritis, unspecified: Secondary | ICD-10-CM | POA: Diagnosis not present

## 2022-06-18 DIAGNOSIS — L89153 Pressure ulcer of sacral region, stage 3: Secondary | ICD-10-CM | POA: Diagnosis not present

## 2022-06-18 DIAGNOSIS — E11622 Type 2 diabetes mellitus with other skin ulcer: Secondary | ICD-10-CM

## 2022-06-18 NOTE — Progress Notes (Signed)
LAURAASHLEY, BONNET ([id number]) [id number] Page 1 of 6 Visit Report for 06/18/2022 Chief Complaint Document Details Patient Name: Date of Service: Shelby Walker, Shelby Walker 06/18/2022 10:15 A M Medical Record Number: [id number] Patient Account Number: [id number] Date of Birth/Sex: Treating RN: 10/20/1933 (89 y.o. F) Primary Care Provider: Adrian Prince Other Clinician: Referring Provider: Treating Provider/Extender: Ignatius Specking in Treatment: 1 Information Obtained from: Patient Chief Complaint 06/01/2022; previous history of sacral ulcer and bilateral feet wounds. Electronic Signature(s) Signed: 06/18/2022 11:42:23 AM By: Geralyn Corwin DO Entered By: Geralyn Corwin on 06/18/2022 10:51:26 -------------------------------------------------------------------------------- HPI Details Patient Name: Date of Service: Shelby Walker. 06/18/2022 10:15 A M Medical Record Number: [id number] Patient Account Number: [id number] Date of Birth/Sex: Treating RN: 1933-06-19 (89 y.o. F) Primary Care Provider: Adrian Prince Other Clinician: Referring Provider: Treating Provider/Extender: Ignatius Specking in Treatment: 1 History of Present Illness HPI Description: 06/01/2022 [name] is an 89 year old female with a past medical history of type 2 diabetes with previous transmetatarsal of the right foot, rheumatoid arthritis that presents to the clinic for concern of wounds to her sacrum and bilateral feet. She was referred by infectious disease and orthopedic surgery for these issues. Daughter is present and helps provide the history. She states that 6 weeks ago she developed pressure injuries and superficial wounds to her feet. She was treated with a long course of antibiotics Due to concern for infection. She has been using Silvadene cream to the areas. She is also been using bunny boots. Over the past several weeks the wounds have healed.  She also developed a sore to her sacrum 2 to 3 weeks ago. She has been keeping the area clean. This area is also healed. She currently denies signs of infection. 4/8; patient was last seen 1 week ago as a consult for her previous left foot wound that was healed. She had a stage I pressure ulcer to her sacrum at that time. Unfortunately this has progressed. She sits for long periods of time in her recliner and couch throughout the day. She is able to ambulate with a walker however it does not appear to be doing very much outside of going to the bathroom and going to the kitchen to eat. She currently denies signs of infection. 4/15; patient presents for follow-up. She has been using Medihoney and Hydrofera Blue to the sacral wound. She has no issues or complaints today. She has tolerated this well. The wound is smaller. Electronic Signature(s) Signed: 06/18/2022 11:42:23 AM By: Geralyn Corwin DO Entered By: Geralyn Corwin on 06/18/2022 10:52:00 -------------------------------------------------------------------------------- Physical Exam Details Patient Name: Date of Service: SEMA, VALLO 06/18/2022 10:15 A M Medical Record Number: [id number] Patient Account Number: [id number] Date of Birth/Sex: Treating RN: 19-Oct-1933 (89 y.o. F) Primary Care Provider: Adrian Prince Other Clinician: Referring Provider: Treating Provider/Extender: Ignatius Specking in Treatment: 1 Constitutional respirations regular, non-labored and within target range for patient.. Cardiovascular JAHNYLA, MOOREHOUSE ([id number]) [id number] Page 2 of 6 2+ dorsalis pedis/posterior tibialis pulses. Psychiatric pleasant and cooperative. Notes T the sacrum there is a small open wound with nonviable tissue and granulation tissue. No signs of surrounding infection. o Electronic Signature(s) Signed: 06/18/2022 11:42:23 AM By: Geralyn Corwin DO Entered By: Geralyn Corwin on  06/18/2022 10:52:42 -------------------------------------------------------------------------------- Physician Orders Details Patient Name: Date of Service: Shelby Walker 06/18/2022 10:15 A M Medical Record Number: [id number] Patient Account Number: [id number] Date of Birth/Sex: Treating RN: 1933/12/06 (89 y.o. F)  Brenton Grills Primary Care Provider: Adrian Prince Other Clinician: Referring Provider: Treating Provider/Extender: Ignatius Specking in Treatment: 1 Verbal / Phone Orders: No Diagnosis Coding ICD-10 Coding Code Description L89.153 Pressure ulcer of sacral region, stage 3 E11.622 Type 2 diabetes mellitus with other skin ulcer M06.9 Rheumatoid arthritis, unspecified Follow-up Appointments ppointment in 2 weeks. - Dr. Mikey Bussing in 2 weeks in room 7. Return A Other: - [facility] wound care supplier Anesthetic (In clinic) Topical Lidocaine 4% applied to wound bed Bathing/ Shower/ Hygiene May shower and wash wound with soap and water. Off-Loading Turn and reposition every 2 hours Other: - ensure to get up and walk every hour to aid in offloading pressure to buttock. Wound Treatment Wound #1 - Sacrum Wound Laterality: Left Cleanser: Soap and Water 1 x Per Day/30 Days Discharge Instructions: May shower and wash wound with dial antibacterial soap and water prior to dressing change. Cleanser: Wound Cleanser 1 x Per Day/30 Days Discharge Instructions: Cleanse the wound with wound cleanser prior to applying a clean dressing using gauze sponges, not tissue or cotton balls. Cleanser: [facility] Ancillary Kit - 15 Day Supply (Generic) 1 x Per Day/30 Days Discharge Instructions: Use supplies as instructed; Kit contains: (15) Saline Bullets; (15) 3x3 Gauze; 15 pr Gloves Peri-Wound Care: Skin Prep (Generic) 1 x Per Day/30 Days Discharge Instructions: Use skin prep as directed Prim Dressing: Hydrofera Blue Ready Transfer Foam, 2.5x2.5 (in/in) 1 x Per Day/30  Days ary Discharge Instructions: Apply directly to wound bed as directed Prim Dressing: MediHoney Gel, tube 1.5 (oz) 1 x Per Day/30 Days ary Discharge Instructions: Apply to wound bed as instructed Secondary Dressing: Zetuvit Plus Silicone Border Dressing 4x4 (in/in) (Generic) 1 x Per Day/30 Days Discharge Instructions: Apply silicone border over primary dressing as directed. Electronic Signature(s) Signed: 06/18/2022 11:42:23 AM By: Rosine Door, Algernon Huxley ([id number]) [id number] Page 3 of 6 Entered By: Geralyn Corwin on 06/18/2022 10:52:49 -------------------------------------------------------------------------------- Problem List Details Patient Name: Date of Service: CHARLINA, ROGALSKI 06/18/2022 10:15 A M Medical Record Number: [id number] Patient Account Number: [id number] Date of Birth/Sex: Treating RN: 1934-03-02 (89 y.o. [name] Primary Care Provider: Adrian Prince Other Clinician: Referring Provider: Treating Provider/Extender: Ignatius Specking in Treatment: 1 Active Problems ICD-10 Encounter Code Description Active Date MDM Diagnosis L89.153 Pressure ulcer of sacral region, stage 3 06/11/2022 No Yes E11.622 Type 2 diabetes mellitus with other skin ulcer 06/11/2022 No Yes M06.9 Rheumatoid arthritis, unspecified 06/11/2022 No Yes Inactive Problems Resolved Problems Electronic Signature(s) Signed: 06/18/2022 11:42:23 AM By: Geralyn Corwin DO Entered By: Geralyn Corwin on 06/18/2022 10:51:15 -------------------------------------------------------------------------------- Progress Note Details Patient Name: Date of Service: Shelby Walker 06/18/2022 10:15 A M Medical Record Number: [id number] Patient Account Number: [id number] Date of Birth/Sex: Treating RN: 1933/11/02 (89 y.o. F) Primary Care Provider: Adrian Prince Other Clinician: Referring Provider: Treating Provider/Extender: Ignatius Specking in Treatment: 1 Subjective Chief Complaint Information obtained from Patient 06/01/2022; previous history of sacral ulcer and bilateral feet wounds. History of Present Illness (HPI) 06/01/2022 [name] is an 89 year old female with a past medical history of type 2 diabetes with previous transmetatarsal of the right foot, rheumatoid arthritis that presents to the clinic for concern of wounds to her sacrum and bilateral feet. She was referred by infectious disease and orthopedic surgery for these issues. Daughter is present and helps provide the history. She states that 6 weeks ago she developed pressure injuries and superficial wounds to her  feet. She was treated with a long course of antibiotics Due to concern for infection. She has been using Silvadene cream to the areas. She is also been using bunny boots. Over the past several weeks the wounds have healed. She also developed a sore to her sacrum 2 to 3 weeks ago. She has been keeping the area clean. This area is also healed. She currently denies signs of infection. 4/8; patient was last seen 1 week ago as a consult for her previous left foot wound that was healed. She had a stage I pressure ulcer to her sacrum at that time. Unfortunately this has progressed. She sits for long periods of time in her recliner and couch throughout the day. She is able to ambulate with a walker however it does not appear to be doing very much outside of going to the bathroom and going to the kitchen to eat. She currently denies signs of infection. 4/15; patient presents for follow-up. She has been using Medihoney and Hydrofera Blue to the sacral wound. She has no issues or complaints today. She has tolerated this well. The wound is smaller. Patient History ARACELI, ARANGO ([id number]) [id number] Page 4 of 6 Information obtained from Patient, Chart. Family History Unknown History. Social History Never smoker, Marital  Status - Widowed, Alcohol Use - Never, Drug Use - No History, Caffeine Use - Rarely. Medical History Endocrine Patient has history of Type II Diabetes Musculoskeletal Patient has history of Osteoarthritis Hospitalization/Surgery History - Ppm generator removal. - tee without conversion. - pacemaker implaint. - loop recorder removal. - transmet. amp. right. - abdominal aortogram w/ lower extremity right. - fracture surgery. Medical A Surgical History Notes nd Cardiovascular mitral regurg., bruit, chest pain,unspecified, dyslipidemia, ejection fraction 55-60 % , palpitations Objective Constitutional respirations regular, non-labored and within target range for patient.. Vitals Time Taken: 10:24 AM, Temperature: 98.9 F, Pulse: 66 bpm, Respiratory Rate: 18 breaths/min, Blood Pressure: 125/81 mmHg, Capillary Blood Glucose: 247 mg/dl. Cardiovascular 2+ dorsalis pedis/posterior tibialis pulses. Psychiatric pleasant and cooperative. General Notes: T the sacrum there is a small open wound with nonviable tissue and granulation tissue. No signs of surrounding infection. o Integumentary (Hair, Skin) Wound #1 status is Open. Original cause of wound was Gradually Appeared. The date acquired was: 06/07/2022. The wound has been in treatment 1 weeks. The wound is located on the Left Sacrum. The wound measures 0.5cm length x 0.5cm width x 0.1cm depth; 0.196cm^2 area and 0.02cm^3 volume. There is Fat Layer (Subcutaneous Tissue) exposed. There is no tunneling or undermining noted. There is a medium amount of serosanguineous drainage noted. The wound margin is distinct with the outline attached to the wound base. There is medium (34-66%) red, pink granulation within the wound bed. There is a medium (34-66%) amount of necrotic tissue within the wound bed including Adherent Slough. The periwound skin appearance did not exhibit: Callus, Crepitus, Excoriation, Induration, Rash, Scarring, Dry/Scaly, Maceration,  Atrophie Blanche, Cyanosis, Ecchymosis, Hemosiderin Staining, Mottled, Pallor, Rubor, Erythema. Assessment Active Problems ICD-10 Pressure ulcer of sacral region, stage 3 Type 2 diabetes mellitus with other skin ulcer Rheumatoid arthritis, unspecified Patient's wound has shown improvement in size in appearance since last clinic visit. I recommended continuing the course with Hydrofera Blue and Medihoney. The wound appears cleaner today with more granulation tissue present. Continue aggressive offloading. Follow-up in 2 weeks. Plan Follow-up Appointments: Return Appointment in 2 weeks. - Dr. Mikey Bussing in 2 weeks in room 7. Other: - [facility] wound care supplier Anesthetic: (In clinic) Topical Lidocaine  4% applied to wound bed Bathing/ Shower/ Hygiene: May shower and wash wound with soap and water. Off-LoadingBEKAH, IGOE ([id number]) [id number] Page 5 of 6 Turn and reposition every 2 hours Other: - ensure to get up and walk every hour to aid in offloading pressure to buttock. WOUND #1: - Sacrum Wound Laterality: Left Cleanser: Soap and Water 1 x Per Day/30 Days Discharge Instructions: May shower and wash wound with dial antibacterial soap and water prior to dressing change. Cleanser: Wound Cleanser 1 x Per Day/30 Days Discharge Instructions: Cleanse the wound with wound cleanser prior to applying a clean dressing using gauze sponges, not tissue or cotton balls. Cleanser: [facility] Ancillary Kit - 15 Day Supply (Generic) 1 x Per Day/30 Days Discharge Instructions: Use supplies as instructed; Kit contains: (15) Saline Bullets; (15) 3x3 Gauze; 15 pr Gloves Peri-Wound Care: Skin Prep (Generic) 1 x Per Day/30 Days Discharge Instructions: Use skin prep as directed Prim Dressing: Hydrofera Blue Ready Transfer Foam, 2.5x2.5 (in/in) 1 x Per Day/30 Days ary Discharge Instructions: Apply directly to wound bed as directed Prim Dressing: MediHoney Gel, tube 1.5 (oz) 1 x Per  Day/30 Days ary Discharge Instructions: Apply to wound bed as instructed Secondary Dressing: Zetuvit Plus Silicone Border Dressing 4x4 (in/in) (Generic) 1 x Per Day/30 Days Discharge Instructions: Apply silicone border over primary dressing as directed. 1. Aggressive offloading 2. Medihoney and Hydrofera Blue 3. Follow-up in 2 weeks Electronic Signature(s) Signed: 06/18/2022 11:42:23 AM By: Geralyn Corwin DO Entered By: Geralyn Corwin on 06/18/2022 10:53:26 -------------------------------------------------------------------------------- HxROS Details Patient Name: Date of Service: Shelby Walker. 06/18/2022 10:15 A M Medical Record Number: [id number] Patient Account Number: [id number] Date of Birth/Sex: Treating RN: 02/05/1934 (89 y.o. F) Primary Care Provider: Adrian Prince Other Clinician: Referring Provider: Treating Provider/Extender: Ignatius Specking in Treatment: 1 Information Obtained From Patient Chart Cardiovascular Medical History: Past Medical History Notes: mitral regurg., bruit, chest pain,unspecified, dyslipidemia, ejection fraction 55-60 % , palpitations Endocrine Medical History: Positive for: Type II Diabetes Musculoskeletal Medical History: Positive for: Osteoarthritis Immunizations Pneumococcal Vaccine: Received Pneumococcal Vaccination: No Implantable Devices None Hospitalization / Surgery History Type of Hospitalization/Surgery Ppm generator removal tee without conversion pacemaker implaint loop recorder removal transmet. amp. right abdominal aortogram w/ lower extremity right fracture surgery Shelby, Walker P ([id number]) [id number] Page 6 of 6 Family and Social History Unknown History: Yes; Never smoker; Marital Status - Widowed; Alcohol Use: Never; Drug Use: No History; Caffeine Use: Rarely; Financial Concerns: No; Food, Clothing or Shelter Needs: No; Support System Lacking: No; Transportation  Concerns: No Electronic Signature(s) Signed: 06/18/2022 11:42:23 AM By: Geralyn Corwin DO Entered By: Geralyn Corwin on 06/18/2022 10:52:10 -------------------------------------------------------------------------------- SuperBill Details Patient Name: Date of Service: Shelby Walker 06/18/2022 Medical Record Number: [id number] Patient Account Number: [id number] Date of Birth/Sex: Treating RN: [birth year] (89 y.o. [name] Primary Care Provider: Adrian Prince Other Clinician: Referring Provider: Treating Provider/Extender: Ignatius Specking in Treatment: 1 Diagnosis Coding ICD-10 Codes Code Description [id number] Pressure ulcer of sacral region, stage 3 E11.622 Type 2 diabetes mellitus with other skin ulcer M06.9 Rheumatoid arthritis, unspecified Facility Procedures : CPT4 Code: [id number] Description: 99213 - WOUND CARE VISIT-LEV 3 EST PT Modifier: 25 Quantity: 1 Physician Procedures : CPT4 Code Description Modifier [id number] 99213 - WC PHYS LEVEL 3 - EST PT ICD-10 Diagnosis Description L89.153 Pressure ulcer of sacral region, stage 3 E11.622 Type 2 diabetes mellitus with other skin ulcer M06.9 Rheumatoid arthritis, unspecified Quantity: 1 Electronic Signature(s) Signed:  06/18/2022 11:42:23 AM By: Geralyn Corwin DO Entered By: Geralyn Corwin on 06/18/2022 10:53:44

## 2022-06-20 ENCOUNTER — Ambulatory Visit (INDEPENDENT_AMBULATORY_CARE_PROVIDER_SITE_OTHER): Payer: Medicare Other

## 2022-06-20 ENCOUNTER — Other Ambulatory Visit: Payer: Self-pay | Admitting: Podiatry

## 2022-06-20 DIAGNOSIS — E08621 Diabetes mellitus due to underlying condition with foot ulcer: Secondary | ICD-10-CM

## 2022-06-20 DIAGNOSIS — L84 Corns and callosities: Secondary | ICD-10-CM

## 2022-06-20 DIAGNOSIS — L97512 Non-pressure chronic ulcer of other part of right foot with fat layer exposed: Secondary | ICD-10-CM

## 2022-06-21 DIAGNOSIS — K8020 Calculus of gallbladder without cholecystitis without obstruction: Secondary | ICD-10-CM | POA: Diagnosis not present

## 2022-06-21 DIAGNOSIS — Z794 Long term (current) use of insulin: Secondary | ICD-10-CM | POA: Diagnosis not present

## 2022-06-21 DIAGNOSIS — R001 Bradycardia, unspecified: Secondary | ICD-10-CM | POA: Diagnosis not present

## 2022-06-21 DIAGNOSIS — E785 Hyperlipidemia, unspecified: Secondary | ICD-10-CM | POA: Diagnosis not present

## 2022-06-21 DIAGNOSIS — I679 Cerebrovascular disease, unspecified: Secondary | ICD-10-CM | POA: Diagnosis not present

## 2022-06-21 DIAGNOSIS — Z89431 Acquired absence of right foot: Secondary | ICD-10-CM | POA: Diagnosis not present

## 2022-06-21 DIAGNOSIS — I129 Hypertensive chronic kidney disease with stage 1 through stage 4 chronic kidney disease, or unspecified chronic kidney disease: Secondary | ICD-10-CM | POA: Diagnosis not present

## 2022-06-21 DIAGNOSIS — E1140 Type 2 diabetes mellitus with diabetic neuropathy, unspecified: Secondary | ICD-10-CM | POA: Diagnosis not present

## 2022-06-21 DIAGNOSIS — I480 Paroxysmal atrial fibrillation: Secondary | ICD-10-CM | POA: Diagnosis not present

## 2022-06-21 DIAGNOSIS — N1832 Chronic kidney disease, stage 3b: Secondary | ICD-10-CM | POA: Diagnosis not present

## 2022-06-21 DIAGNOSIS — M069 Rheumatoid arthritis, unspecified: Secondary | ICD-10-CM | POA: Diagnosis not present

## 2022-06-21 DIAGNOSIS — I700 Atherosclerosis of aorta: Secondary | ICD-10-CM | POA: Diagnosis not present

## 2022-06-21 NOTE — Progress Notes (Signed)
Shelby Walker ([id number]) [id number] Page 1 of 8 Visit Report for 06/18/2022 Arrival Information Details Patient Name: Date of Service: Shelby Walker, Shelby Walker 06/18/2022 10:15 A M Medical Record Number: [id number] Patient Account Number: [id number] Date of Birth/Sex: Treating RN: [birth year] (89 y.o. Shelby Walker Primary Care Shelby Walker: Shelby Walker Other Clinician: Referring Shelby Walker: Treating Shelby Walker/Extender: Shelby Walker in Treatment: 1 Visit Information History Since Last Visit All ordered tests and consults were completed: Yes Patient Arrived: Shelby Walker Added or deleted any medications: No Arrival Time: 10:23 Any new allergies or adverse reactions: No Accompanied By: caregiver Had a fall or experienced change in No Transfer Assistance: None activities of daily living that may affect Patient Identification Verified: Yes risk of falls: Secondary Verification Process Completed: Yes Signs or symptoms of abuse/neglect since last visito No Patient Requires Transmission-Based Precautions: No Hospitalized since last visit: No Patient Has Alerts: No Implantable device outside of the clinic excluding No cellular tissue based products placed in the center since last visit: Has Dressing in Place as Prescribed: Yes Pain Present Now: No Electronic Signature(s) Signed: 06/21/2022 1:56:54 PM By: Shelby Walker Entered By: Shelby Walker on 06/18/2022 10:24:15 -------------------------------------------------------------------------------- Clinic Level of Care Assessment Details Patient Name: Date of Service: Shelby Walker, Shelby Walker 06/18/2022 10:15 A M Medical Record Number: [id number] Patient Account Number: [id number] Date of Birth/Sex: Treating RN: 06/01/1933 (89 y.o. Shelby Walker Primary Care Danisha Brassfield: Shelby Walker Other Clinician: Referring Shelby Walker: Treating Shelby Walker/Extender: Shelby Walker in Treatment:  1 Clinic Level of Care Assessment Items TOOL 4 Quantity Score X- 1 0 Use when only an EandM is performed on FOLLOW-UP visit ASSESSMENTS - Nursing Assessment / Reassessment X- 1 10 Reassessment of Co-morbidities (includes updates in patient status) X- 1 5 Reassessment of Adherence to Treatment Plan ASSESSMENTS - Wound and Skin A ssessment / Reassessment X - Simple Wound Assessment / Reassessment - one wound 1 5  - 0 Complex Wound Assessment / Reassessment - multiple wounds  - 0 Dermatologic / Skin Assessment (not related to wound area) ASSESSMENTS - Focused Assessment  - 0 Circumferential Edema Measurements - multi extremities  - 0 Nutritional Assessment / Counseling / Intervention  - 0 Lower Extremity Assessment (monofilament, tuning fork, pulses)  - 0 Peripheral Arterial Disease Assessment (using hand held doppler) ASSESSMENTS - Ostomy and/or Continence Assessment and Care  - 0 Incontinence Assessment and Management  - 0 Ostomy Care Assessment and Management (repouching, etc.) PROCESS - Coordination of Care Shelby Walker ([id number]) [id number] Page 2 of 8 X- 1 15 Simple Patient / Family Education for ongoing care  - 0 Complex (extensive) Patient / Family Education for ongoing care X- 1 10 Staff obtains Chiropractor, Records, T Results / Process Orders est  - 0 Staff telephones HHA, Nursing Homes / Clarify orders / etc  - 0 Routine Transfer to another Facility (non-emergent condition)  - 0 Routine Hospital Admission (non-emergent condition)  - 0 New Admissions / Manufacturing engineer / Ordering NPWT Apligraf, etc. ,  - 0 Emergency Hospital Admission (emergent condition) X- 1 10 Simple Discharge Coordination  - 0 Complex (extensive) Discharge Coordination PROCESS - Special Needs  - 0 Pediatric / Minor Patient Management  - 0 Isolation Patient Management  - 0 Hearing / Language / Visual special  needs  - 0 Assessment of Community assistance (transportation, D/C planning, etc.)  - 0 Additional assistance / Altered mentation  - 0 Support Surface(s) Assessment (bed, cushion, seat, etc.) INTERVENTIONS - Wound Cleansing /  Measurement X - Simple Wound Cleansing - one wound 1 5 []  - 0 Complex Wound Cleansing - multiple wounds []  - 0 Wound Imaging (photographs - any number of wounds) []  - 0 Wound Tracing (instead of photographs) X- 1 5 Simple Wound Measurement - one wound []  - 0 Complex Wound Measurement - multiple wounds INTERVENTIONS - Wound Dressings X - Small Wound Dressing one or multiple wounds 1 10 []  - 0 Medium Wound Dressing one or multiple wounds []  - 0 Large Wound Dressing one or multiple wounds X- 1 5 Application of Medications - topical []  - 0 Application of Medications - injection INTERVENTIONS - Miscellaneous []  - 0 External ear exam []  - 0 Specimen Collection (cultures, biopsies, blood, body fluids, etc.) []  - 0 Specimen(s) / Culture(s) sent or taken to Lab for analysis []  - 0 Patient Transfer (multiple staff / Nurse, adult / Similar devices) []  - 0 Simple Staple / Suture removal (25 or less) []  - 0 Complex Staple / Suture removal (26 or more) []  - 0 Hypo / Hyperglycemic Management (close monitor of Blood Glucose) []  - 0 Ankle / Brachial Index (ABI) - do not check if billed separately X- 1 5 Vital Signs Has the patient been seen at the hospital within the last three years: Yes Total Score: 85 Level Of Care: New/Established - Level 3 Electronic Signature(s) Signed: 06/21/2022 1:56:54 PM By: Clearance Coots, Shelby Walker ([id number]) [id number] Page 3 of 8 Entered By: Shelby Walker on 06/18/2022 10:52:21 -------------------------------------------------------------------------------- Encounter Discharge Information Details Patient Name: Date of Service: Shelby Walker, Shelby Walker 06/18/2022 10:15 A M Medical Record Number:  [id number] Patient Account Number: [id number] Date of Birth/Sex: Treating RN: April 28, 1933 (89 y.o. Shelby Walker Primary Care Yves Fodor: Shelby Walker Other Clinician: Referring Brailyn Killion: Treating Jamesmichael Shadd/Extender: Shelby Walker in Treatment: 1 Encounter Discharge Information Items Discharge Condition: Stable Ambulatory Status: Walker Discharge Destination: Home Transportation: Private Auto Accompanied By: caregiver Schedule Follow-up Appointment: Yes Clinical Summary of Care: Patient Declined Electronic Signature(s) Signed: 06/21/2022 1:56:54 PM By: Shelby Walker Entered By: Shelby Walker on 06/18/2022 10:54:10 -------------------------------------------------------------------------------- Lower Extremity Assessment Details Patient Name: Date of Service: Shelby Walker, Shelby Walker 06/18/2022 10:15 A M Medical Record Number: [id number] Patient Account Number: [id number] Date of Birth/Sex: Treating RN: 1934-01-16 (89 y.o. Shelby Walker Primary Care Jackilyn Umphlett: Shelby Walker Other Clinician: Referring Laurena Valko: Treating Lavenia Stumpo/Extender: Shelby Walker in Treatment: 1 Electronic Signature(s) Signed: 06/21/2022 1:56:54 PM By: Shelby Walker Entered By: Shelby Walker on 06/18/2022 10:27:25 -------------------------------------------------------------------------------- Multi Wound Chart Details Patient Name: Date of Service: Shelby Walker 06/18/2022 10:15 A M Medical Record Number: [id number] Patient Account Number: [id number] Date of Birth/Sex: Treating RN: Jun 03, 1933 (89 y.o. F) Primary Care Brookelyn Gaynor: Shelby Walker Other Clinician: Referring Rodrick Payson: Treating Nimesh Riolo/Extender: Shelby Walker in Treatment: 1 Vital Signs Height(in): Capillary Blood Glucose(mg/dl): 664 Weight(lbs): Pulse(bpm): 66 Body Mass Index(BMI): Blood Pressure(mmHg): 125/81 Temperature(F): 98.9 Respiratory Rate(breaths/min):  18 [1:Photos:] [N/A:N/A] Left Sacrum N/A N/A Wound Location: Gradually Appeared N/A N/A Wounding Event: Pressure Ulcer N/A N/A Primary Etiology: Type II Diabetes, Osteoarthritis N/A N/A Comorbid History: 06/07/2022 N/A N/A Date Acquired: 1 N/A N/A Weeks of Treatment: Open N/A N/A Wound Status: No N/A N/A Wound Recurrence: 0.5x0.5x0.1 N/A N/A Measurements L x W x D (cm) 0.196 N/A N/A A (cm) : rea 0.02 N/A N/A Volume (cm) : 48.00% N/A N/A % Reduction in A rea: 47.40% N/A N/A % Reduction in Volume: Category/Stage III N/A N/A Classification: Medium  N/A N/A Exudate A mount: Serosanguineous N/A N/A Exudate Type: red, brown N/A N/A Exudate Color: Distinct, outline attached N/A N/A Wound Margin: Medium (34-66%) N/A N/A Granulation A mount: Red, Pink N/A N/A Granulation Quality: Medium (34-66%) N/A N/A Necrotic A mount: Fat Layer (Subcutaneous Tissue): Yes N/A N/A Exposed Structures: Fascia: No Tendon: No Muscle: No Joint: No Bone: No Small (1-33%) N/A N/A Epithelialization: Excoriation: No N/A N/A Periwound Skin Texture: Induration: No Callus: No Crepitus: No Rash: No Scarring: No Maceration: No N/A N/A Periwound Skin Moisture: Dry/Scaly: No Atrophie Blanche: No N/A N/A Periwound Skin Color: Cyanosis: No Ecchymosis: No Erythema: No Hemosiderin Staining: No Mottled: No Pallor: No Rubor: No Treatment Notes Electronic Signature(s) Signed: 06/18/2022 11:42:23 AM By: Geralyn Corwin DO Entered By: Geralyn Corwin on 06/18/2022 10:51:19 -------------------------------------------------------------------------------- Multi-Disciplinary Care Plan Details Patient Name: Date of Service: Shelby Walker. 06/18/2022 10:15 A M Medical Record Number: [id number] Patient Account Number: [id number] Date of Birth/Sex: Treating RN: 09-Aug-1933 (89 y.o. Shelby Walker Primary Care Bing Duffey: Shelby Walker Other Clinician: Referring Jurgen Groeneveld: Treating  Windell Musson/Extender: Shelby Walker in Treatment: 1 Active Inactive Pain, Acute or Chronic Nursing Diagnoses: Pain, acute or chronic: actual or potential Potential alteration in comfort, pain Goals: Patient will verbalize adequate pain control and receive pain control interventions during procedures as needed Date Initiated: 06/11/2022 Target Resolution Date: 07/12/2022 Goal Status: Active Interventions: Shelby Walker, Shelby Walker ([id number]) [id number].pdf Page 5 of 8 Encourage patient to take pain medications as prescribed Provide education on pain management Reposition patient for comfort Treatment Activities: Administer pain control measures as ordered : 06/11/2022 Notes: Wound/Skin Impairment Nursing Diagnoses: Knowledge deficit related to ulceration/compromised skin integrity Goals: Patient/caregiver will verbalize understanding of skin care regimen Date Initiated: 06/11/2022 Target Resolution Date: 07/13/2022 Goal Status: Active Interventions: Assess patient/caregiver ability to perform ulcer/skin care regimen upon admission and as needed Assess ulceration(s) every visit Provide education on ulcer and skin care Treatment Activities: Skin care regimen initiated : 06/11/2022 Topical wound management initiated : 06/11/2022 Notes: Electronic Signature(s) Signed: 06/21/2022 1:56:54 PM By: Shelby Walker Entered By: Shelby Walker on 06/18/2022 10:41:08 -------------------------------------------------------------------------------- Pain Assessment Details Patient Name: Date of Service: Shelby Walker 06/18/2022 10:15 A M Medical Record Number: [id number] Patient Account Number: [id number] Date of Birth/Sex: Treating RN: 05-Dec-1933 (89 y.o. Shelby Walker Primary Care Zev Blue: Shelby Walker Other Clinician: Referring Jassmine Vandruff: Treating Alanah Sakuma/Extender: Shelby Walker in Treatment: 1 Active Problems Location of Pain  Severity and Description of Pain Patient Has Paino No Site Locations Pain Management and Medication Current Pain Management: Electronic Signature(s) Signed: 06/21/2022 1:56:54 PM By: Clearance Coots, Shelby Walker ([id number]) 1:56:54 PM By: Shelby Walker [phone].pdf Page 6 of 8 Signed: 06/21/2022 Entered By: Shelby Walker on 06/18/2022 10:27:13 -------------------------------------------------------------------------------- Patient/Caregiver Education Details Patient Name: Date of Service: Shelby Walker, Shelby Walker 4/15/2024andnbsp10:15 A M Medical Record Number: [id number] Patient Account Number: [id number] Date of Birth/Gender: Treating RN: 18-Feb-1934 (89 y.o. Shelby Walker Primary Care Physician: Shelby Walker Other Clinician: Referring Physician: Treating Physician/Extender: Shelby Walker in Treatment: 1 Education Assessment Education Provided To: Patient and Caregiver Education Topics Provided Wound/Skin Impairment: Methods: Explain/Verbal Responses: Reinforcements needed, State content correctly Electronic Signature(s) Signed: 06/21/2022 1:56:54 PM By: Shelby Walker Entered By: Shelby Walker on 06/18/2022 10:41:34 -------------------------------------------------------------------------------- Wound Assessment Details Patient Name: Date of Service: Shelby Walker 06/18/2022 10:15 A M Medical Record Number: [id number] Patient Account Number: [id number] Date of Birth/Sex: Treating RN: 05-27-1933 (89 y.o. Shelby Walker  Primary Care Bay Jarquin: Shelby Walker Other Clinician: Referring Ronee Ranganathan: Treating Jaycelynn Knickerbocker/Extender: Shelby Walker in Treatment: 1 Wound Status Wound Number: 1 Primary Etiology: Pressure Ulcer Wound Location: Left Sacrum Wound Status: Open Wounding Event: Gradually Appeared Comorbid History: Type II Diabetes, Osteoarthritis Date Acquired: 06/07/2022 Weeks Of Treatment: 1 Clustered  Wound: No Photos Wound Measurements Length: (cm) 0.5 Width: (cm) 0.5 Depth: (cm) 0.1 Area: (cm) 0.196 Volume: (cm) 0.02 % Reduction in Area: 48% % Reduction in Volume: 47.4% Epithelialization: Small (1-33%) Tunneling: No Undermining: No Wound Description Shelby Walker, Shelby Walker ([id number]) Classification: Category/Stage III Wound Margin: Distinct, outline attached Exudate Amount: Medium Exudate Type: Serosanguineous Exudate Color: red, brown [phone].pdf Page 7 of 8 Foul Odor After Cleansing: No Slough/Fibrino Yes Wound Bed Granulation Amount: Medium (34-66%) Exposed Structure Granulation Quality: Red, Pink Fascia Exposed: No Necrotic Amount: Medium (34-66%) Fat Layer (Subcutaneous Tissue) Exposed: Yes Necrotic Quality: Adherent Slough Tendon Exposed: No Muscle Exposed: No Joint Exposed: No Bone Exposed: No Periwound Skin Texture Texture Color No Abnormalities Noted: No No Abnormalities Noted: No Callus: No Atrophie Blanche: No Crepitus: No Cyanosis: No Excoriation: No Ecchymosis: No Induration: No Erythema: No Rash: No Hemosiderin Staining: No Scarring: No Mottled: No Pallor: No Moisture Rubor: No No Abnormalities Noted: No Dry / Scaly: No Maceration: No Treatment Notes Wound #1 (Sacrum) Wound Laterality: Left Cleanser Soap and Water Discharge Instruction: May shower and wash wound with dial antibacterial soap and water prior to dressing change. Wound Cleanser Discharge Instruction: Cleanse the wound with wound cleanser prior to applying a clean dressing using gauze sponges, not tissue or cotton balls. [employer] Ancillary Kit - 15 Day Supply Discharge Instruction: Use supplies as instructed; Kit contains: (15) Saline Bullets; (15) 3x3 Gauze; 15 pr Gloves Peri-Wound Care Skin Prep Discharge Instruction: Use skin prep as directed Topical Primary Dressing Hydrofera Blue Ready Transfer Foam, 2.5x2.5 (in/in) Discharge Instruction: Apply  directly to wound bed as directed MediHoney Gel, tube 1.5 (oz) Discharge Instruction: Apply to wound bed as instructed Secondary Dressing Zetuvit Plus Silicone Border Dressing 4x4 (in/in) Discharge Instruction: Apply silicone border over primary dressing as directed. Secured With Compression Wrap Compression Stockings Add-Ons Electronic Signature(s) Signed: 06/21/2022 1:56:54 PM By: Shelby Walker Entered By: Shelby Walker on 06/18/2022 10:34:59 Vitals Details -------------------------------------------------------------------------------- Shelby Walker ([id number]) [id number] Page 8 of 8 Patient Name: Date of Service: Shelby Walker, Shelby Walker 06/18/2022 10:15 A M Medical Record Number: [id number] Patient Account Number: [id number] Date of Birth/Sex: Treating RN: 01-26-1934 (89 y.o. Shelby Walker Primary Care Brittnee Gaetano: Shelby Walker Other Clinician: Referring Jakolby Sedivy: Treating Jacory Kamel/Extender: Shelby Walker in Treatment: 1 Vital Signs Time Taken: 10:24 Temperature (F): 98.9 Pulse (bpm): 66 Respiratory Rate (breaths/min): 18 Blood Pressure (mmHg): 125/81 Capillary Blood Glucose (mg/dl): 409 Reference Range: 80 - 120 mg / dl Electronic Signature(s) Signed: 06/21/2022 1:56:54 PM By: Shelby Walker Entered By: Shelby Walker on 06/18/2022 10:27:04

## 2022-06-25 NOTE — Progress Notes (Signed)
Patient presents to the office today for diabetic shoe and insole measuring.  Patient was measured with brannock device to determine size and width for 1 pair of extra depth shoes and foam casted for 3 pair of insoles.   ABN signed.   Documentation of medical necessity will be sent to patient's treating diabetic doctor to verify and sign.   Patient's diabetic provider: Adrian Prince, MD   Shoes and insoles will be ordered at that time and patient will be notified for an appointment for fitting when they arrive.   Patient shoe selection-   1st   Shoe choice:   [id number]  Shoe size ordered: 7.5W

## 2022-07-02 ENCOUNTER — Encounter (HOSPITAL_BASED_OUTPATIENT_CLINIC_OR_DEPARTMENT_OTHER): Payer: Medicare Other | Admitting: Internal Medicine

## 2022-07-02 DIAGNOSIS — M069 Rheumatoid arthritis, unspecified: Secondary | ICD-10-CM

## 2022-07-02 DIAGNOSIS — L89153 Pressure ulcer of sacral region, stage 3: Secondary | ICD-10-CM

## 2022-07-02 DIAGNOSIS — E11622 Type 2 diabetes mellitus with other skin ulcer: Secondary | ICD-10-CM | POA: Diagnosis not present

## 2022-07-02 DIAGNOSIS — M5416 Radiculopathy, lumbar region: Secondary | ICD-10-CM | POA: Diagnosis not present

## 2022-07-03 NOTE — Progress Notes (Signed)
Shelby Walker ([id number]) [id number] Page 1 of 6 Visit Report for 07/02/2022 Chief Complaint Document Details Patient Name: Date of Service: Shelby Walker, Shelby Walker 07/02/2022 12:30 PM Medical Record Number: [id number] Patient Account Number: [id number] Date of Birth/Sex: Treating RN: [birth year] (89 y.o. F) Primary Care Provider: Adrian Prince Other Clinician: Referring Provider: Treating Provider/Extender: Ignatius Specking in Treatment: 3 Information Obtained from: Patient Chief Complaint 06/01/2022; previous history of sacral ulcer and bilateral feet wounds. Electronic Signature(s) Signed: 07/02/2022 3:46:02 PM By: Shelby Corwin DO Entered By: Shelby Walker on 07/02/2022 13:08:30 -------------------------------------------------------------------------------- HPI Details Patient Name: Date of Service: Shelby Walker 07/02/2022 12:30 PM Medical Record Number: [id number] Patient Account Number: [id number] Date of Birth/Sex: Treating RN: 1933/11/03 (89 y.o. F) Primary Care Provider: Adrian Prince Other Clinician: Referring Provider: Treating Provider/Extender: Ignatius Specking in Treatment: 3 History of Present Illness HPI Description: 06/01/2022 Shelby Walker is an 89 year old female with a past medical history of type 2 diabetes with previous transmetatarsal of the right foot, rheumatoid arthritis that presents to the clinic for concern of wounds to her sacrum and bilateral feet. She was referred by infectious disease and orthopedic surgery for these issues. Daughter is present and helps provide the history. She states that 6 weeks ago she developed pressure injuries and superficial wounds to her feet. She was treated with a long course of antibiotics Due to concern for infection. She has been using Silvadene cream to the areas. She is also been using bunny boots. Over the past several weeks the wounds have healed. She  also developed a sore to her sacrum 2 to 3 weeks ago. She has been keeping the area clean. This area is also healed. She currently denies signs of infection. 4/8; patient was last seen 1 week ago as a consult for her previous left foot wound that was healed. She had a stage I pressure ulcer to her sacrum at that time. Unfortunately this has progressed. She sits for long periods of time in her recliner and couch throughout the day. She is able to ambulate with a walker however it does not appear to be doing very much outside of going to the bathroom and going to the kitchen to eat. She currently denies signs of infection. 4/15; patient presents for follow-up. She has been using Medihoney and Hydrofera Blue to the sacral wound. She has no issues or complaints today. She has tolerated this well. The wound is smaller. 4/29; patient presents for follow-up. She has been using Medihoney and Hydrofera Blue to the sacral wound. It is almost healed. She reports being diligent about her offloading. Electronic Signature(s) Signed: 07/02/2022 3:46:02 PM By: Shelby Corwin DO Entered By: Shelby Walker on 07/02/2022 13:08:58 -------------------------------------------------------------------------------- Physical Exam Details Patient Name: Date of Service: Shelby Walker, Shelby Walker 07/02/2022 12:30 PM Medical Record Number: [id number] Patient Account Number: [id number] Date of Birth/Sex: Treating RN: [birth year] (89 y.o. F) Primary Care Provider: Adrian Prince Other Clinician: Referring Provider: Treating Provider/Extender: Ignatius Specking in Treatment: 3 Constitutional respirations regular, non-labored and within target range for patient.Marland Kitchen Shelby Walker, Shelby Walker ([id number]) [id number] Page 2 of 6 Cardiovascular 2+ dorsalis pedis/posterior tibialis pulses. Psychiatric pleasant and cooperative. Notes T the sacrum there is a small open wound with granulation tissue and  scant nonviable tissue. No surrounding signs of infection. o Electronic Signature(s) Signed: 07/02/2022 3:46:02 PM By: Shelby Corwin DO Entered By: Shelby Walker on 07/02/2022 13:10:33 -------------------------------------------------------------------------------- Physician Orders Details Patient Name: Date of  Service: Shelby Walker 07/02/2022 12:30 PM Medical Record Number: [id number] Patient Account Number: [id number] Date of Birth/Sex: Treating RN: 23-Mar-1933 (89 y.o. Shelby Walker Primary Care Provider: Adrian Prince Other Clinician: Referring Provider: Treating Provider/Extender: Ignatius Specking in Treatment: 3 Verbal / Phone Orders: No Diagnosis Coding Follow-up Appointments ppointment in 2 weeks. - Dr. Mikey Bussing in 2 weeks in room 9 Return A Thursday 07/12/22 at 2:45pm Other: - [facility] wound care supplier Anesthetic (In clinic) Topical Lidocaine 4% applied to wound bed Bathing/ Shower/ Hygiene May shower and wash wound with soap and water. Off-Loading Turn and reposition every 2 hours Other: - ensure to get up and walk every hour to aid in offloading pressure to buttock. Wound Treatment Wound #1 - Sacrum Wound Laterality: Left Cleanser: Soap and Water 1 x Per Day/30 Days Discharge Instructions: May shower and wash wound with dial antibacterial soap and water prior to dressing change. Cleanser: Wound Cleanser 1 x Per Day/30 Days Discharge Instructions: Cleanse the wound with wound cleanser prior to applying a clean dressing using gauze sponges, not tissue or cotton balls. Cleanser: [facility] Ancillary Kit - 15 Day Supply (Generic) 1 x Per Day/30 Days Discharge Instructions: Use supplies as instructed; Kit contains: (15) Saline Bullets; (15) 3x3 Gauze; 15 pr Gloves Peri-Wound Care: Skin Prep (DME) (Generic) 1 x Per Day/30 Days Discharge Instructions: Use skin prep as directed Prim Dressing: Hydrofera Blue Ready Transfer Foam, 2.5x2.5 (in/in) (DME)  (Generic) 1 x Per Day/30 Days ary Discharge Instructions: Apply directly to wound bed as directed Prim Dressing: MediHoney Gel, tube 1.5 (oz) 1 x Per Day/30 Days ary Discharge Instructions: Apply to wound bed as instructed Secondary Dressing: Zetuvit Plus Silicone Border Dressing 4x4 (in/in) (DME) (Generic) 1 x Per Day/30 Days Discharge Instructions: Apply silicone border over primary dressing as directed. Electronic Signature(s) Signed: 07/02/2022 3:46:02 PM By: Shelby Corwin DO Signed: 07/02/2022 5:32:44 PM By: Karie Schwalbe RN Entered By: Karie Schwalbe on 07/02/2022 13:11:55 Despina Arias ([id number]) [id number] Page 3 of 6 -------------------------------------------------------------------------------- Problem List Details Patient Name: Date of Service: Shelby Walker, Shelby Walker 07/02/2022 12:30 PM Medical Record Number: [id number] Patient Account Number: [id number] Date of Birth/Sex: Treating RN: 12/30/1933 (89 y.o. F) Primary Care Provider: Adrian Prince Other Clinician: Referring Provider: Treating Provider/Extender: Ignatius Specking in Treatment: 3 Active Problems ICD-10 Encounter Code Description Active Date MDM Diagnosis L89.153 Pressure ulcer of sacral region, stage 3 06/11/2022 No Yes E11.622 Type 2 diabetes mellitus with other skin ulcer 06/11/2022 No Yes M06.9 Rheumatoid arthritis, unspecified 06/11/2022 No Yes Inactive Problems Resolved Problems Electronic Signature(s) Signed: 07/02/2022 3:46:02 PM By: Shelby Corwin DO Entered By: Shelby Walker on 07/02/2022 13:08:13 -------------------------------------------------------------------------------- Progress Note Details Patient Name: Date of Service: Shelby Walker 07/02/2022 12:30 PM Medical Record Number: [id number] Patient Account Number: [id number] Date of Birth/Sex: Treating RN: 12-Apr-1933 (89 y.o. F) Primary Care Provider: Adrian Prince Other Clinician: Referring  Provider: Treating Provider/Extender: Ignatius Specking in Treatment: 3 Subjective Chief Complaint Information obtained from Patient 06/01/2022; previous history of sacral ulcer and bilateral feet wounds. History of Present Illness (HPI) 06/01/2022 Ms. Mistie Adney is an 89 year old female with a past medical history of type 2 diabetes with previous transmetatarsal of the right foot, rheumatoid arthritis that presents to the clinic for concern of wounds to her sacrum and bilateral feet. She was referred by infectious disease and orthopedic surgery for these issues. Daughter is present and helps provide the history. She states that  6 weeks ago she developed pressure injuries and superficial wounds to her feet. She was treated with a long course of antibiotics Due to concern for infection. She has been using Silvadene cream to the areas. She is also been using bunny boots. Over the past several weeks the wounds have healed. She also developed a sore to her sacrum 2 to 3 weeks ago. She has been keeping the area clean. This area is also healed. She currently denies signs of infection. 4/8; patient was last seen 1 week ago as a consult for her previous left foot wound that was healed. She had a stage I pressure ulcer to her sacrum at that time. Unfortunately this has progressed. She sits for long periods of time in her recliner and couch throughout the day. She is able to ambulate with a walker however it does not appear to be doing very much outside of going to the bathroom and going to the kitchen to eat. She currently denies signs of infection. 4/15; patient presents for follow-up. She has been using Medihoney and Hydrofera Blue to the sacral wound. She has no issues or complaints today. She has tolerated this well. The wound is smaller. 4/29; patient presents for follow-up. She has been using Medihoney and Hydrofera Blue to the sacral wound. It is almost healed. She reports being  diligent about her offloading. Patient History Information obtained from Patient, Chart. Shelby Walker, Shelby Walker ([id number]) [id number] Page 4 of 6 Family History Unknown History. Social History Never smoker, Marital Status - Widowed, Alcohol Use - Never, Drug Use - No History, Caffeine Use - Rarely. Medical History Endocrine Patient has history of Type II Diabetes Musculoskeletal Patient has history of Osteoarthritis Hospitalization/Surgery History - Ppm generator removal. - tee without conversion. - pacemaker implaint. - loop recorder removal. - transmet. amp. right. - abdominal aortogram w/ lower extremity right. - fracture surgery. Medical A Surgical History Notes nd Cardiovascular mitral regurg., bruit, chest pain,unspecified, dyslipidemia, ejection fraction 55-60 % , palpitations Objective Constitutional respirations regular, non-labored and within target range for patient.. Vitals Time Taken: 12:44 PM, Height: 61 in, Weight: 110 lbs, BMI: 20.8, Temperature: 98.3 F, Pulse: 80 bpm, Respiratory Rate: 16 breaths/min, Blood Pressure: 118/62 mmHg. Cardiovascular 2+ dorsalis pedis/posterior tibialis pulses. Psychiatric pleasant and cooperative. General Notes: T the sacrum there is a small open wound with granulation tissue and scant nonviable tissue. No surrounding signs of infection. o Integumentary (Hair, Skin) Wound #1 status is Open. Original cause of wound was Gradually Appeared. The date acquired was: 06/07/2022. The wound has been in treatment 3 weeks. The wound is located on the Left Sacrum. The wound measures 0.3cm length x 0.3cm width x 0.1cm depth; 0.071cm^2 area and 0.007cm^3 volume. There is Fat Layer (Subcutaneous Tissue) exposed. There is no tunneling or undermining noted. There is a medium amount of serosanguineous drainage noted. The wound margin is distinct with the outline attached to the wound base. There is medium (34-66%) red, pink  granulation within the wound bed. There is a medium (34-66%) amount of necrotic tissue within the wound bed including Adherent Slough. The periwound skin appearance had no abnormalities noted for moisture. The periwound skin appearance did not exhibit: Callus, Crepitus, Excoriation, Induration, Rash, Scarring, Atrophie Blanche, Cyanosis, Ecchymosis, Hemosiderin Staining, Mottled, Pallor, Rubor, Erythema. Periwound temperature was noted as No Abnormality. Assessment Active Problems ICD-10 Pressure ulcer of sacral region, stage 3 Type 2 diabetes mellitus with other skin ulcer Rheumatoid arthritis, unspecified Patient's wound appears well-healing. I recommended continuing the course  with Medihoney and Hydrofera Blue and aggressive offloading. Follow-up in 2 weeks. Plan Follow-up Appointments: Return Appointment in 2 weeks. - Dr. Mikey Bussing in 2 weeks in room 9 Thursday 07/12/22 at 2:45pm Other: - [facility] wound care supplier Anesthetic: (In clinic) Topical Lidocaine 4% applied to wound bed Bathing/ Shower/ Hygiene: May shower and wash wound with soap and water. Off-Loading: Turn and reposition every 2 hours Shelby Walker, Shelby Walker ([id number]) [id number] Page 5 of 6 Other: - ensure to get up and walk every hour to aid in offloading pressure to buttock. WOUND #1: - Sacrum Wound Laterality: Left Cleanser: Soap and Water 1 x Per Day/30 Days Discharge Instructions: May shower and wash wound with dial antibacterial soap and water prior to dressing change. Cleanser: Wound Cleanser 1 x Per Day/30 Days Discharge Instructions: Cleanse the wound with wound cleanser prior to applying a clean dressing using gauze sponges, not tissue or cotton balls. Cleanser: [facility] Ancillary Kit - 15 Day Supply (Generic) 1 x Per Day/30 Days Discharge Instructions: Use supplies as instructed; Kit contains: (15) Saline Bullets; (15) 3x3 Gauze; 15 pr Gloves Peri-Wound Care: Skin Prep (DME) (Generic) 1 x Per  Day/30 Days Discharge Instructions: Use skin prep as directed Prim Dressing: Hydrofera Blue Ready Transfer Foam, 2.5x2.5 (in/in) (DME) (Generic) 1 x Per Day/30 Days ary Discharge Instructions: Apply directly to wound bed as directed Prim Dressing: MediHoney Gel, tube 1.5 (oz) 1 x Per Day/30 Days ary Discharge Instructions: Apply to wound bed as instructed Secondary Dressing: Zetuvit Plus Silicone Border Dressing 4x4 (in/in) (DME) (Generic) 1 x Per Day/30 Days Discharge Instructions: Apply silicone border over primary dressing as directed. 1. Medihoney and Hydrofera Blue 2. Aggressive offloading 3. Follow-up in 2 weeks Electronic Signature(s) Signed: 07/02/2022 3:46:02 PM By: Shelby Corwin DO Entered By: Shelby Walker on 07/02/2022 13:12:09 -------------------------------------------------------------------------------- HxROS Details Patient Name: Date of Service: Shelby Walker 07/02/2022 12:30 PM Medical Record Number: [id number] Patient Account Number: [id number] Date of Birth/Sex: Treating RN: 03-13-1933 (89 y.o. F) Primary Care Provider: Adrian Prince Other Clinician: Referring Provider: Treating Provider/Extender: Ignatius Specking in Treatment: 3 Information Obtained From Patient Chart Cardiovascular Medical History: Past Medical History Notes: mitral regurg., bruit, chest pain,unspecified, dyslipidemia, ejection fraction 55-60 % , palpitations Endocrine Medical History: Positive for: Type II Diabetes Musculoskeletal Medical History: Positive for: Osteoarthritis Immunizations Pneumococcal Vaccine: Received Pneumococcal Vaccination: No Implantable Devices None Hospitalization / Surgery History Type of Hospitalization/Surgery Ppm generator removal tee without conversion pacemaker implaint loop recorder removal transmet. amp. right abdominal aortogram w/ lower extremity right fracture surgery Shelby Walker, Shelby Walker ([id number])  [id number] Page 6 of 6 Family and Social History Unknown History: Yes; Never smoker; Marital Status - Widowed; Alcohol Use: Never; Drug Use: No History; Caffeine Use: Rarely; Financial Concerns: No; Food, Clothing or Shelter Needs: No; Support System Lacking: No; Transportation Concerns: No Electronic Signature(s) Signed: 07/02/2022 3:46:02 PM By: Shelby Corwin DO Entered By: Shelby Walker on 07/02/2022 13:09:04 -------------------------------------------------------------------------------- SuperBill Details Patient Name: Date of Service: Shelby Walker 07/02/2022 Medical Record Number: [id number] Patient Account Number: [id number] Date of Birth/Sex: Treating RN: 08-Jun-1933 (89 y.o. F) Primary Care Provider: Adrian Prince Other Clinician: Referring Provider: Treating Provider/Extender: Ignatius Specking in Treatment: 3 Diagnosis Coding ICD-10 Codes Code Description [phone] Pressure ulcer of sacral region, stage 3 E11.622 Type 2 diabetes mellitus with other skin ulcer M06.9 Rheumatoid arthritis, unspecified Facility Procedures : CPT4 Code: [id number] Description: 99213 - WOUND CARE VISIT-LEV 3 EST PT Modifier: Quantity: 1 Physician Procedures :  CPT4 Code Description Modifier [id number] 99213 - WC PHYS LEVEL 3 - EST PT ICD-10 Diagnosis Description L89.153 Pressure ulcer of sacral region, stage 3 E11.622 Type 2 diabetes mellitus with other skin ulcer M06.9 Rheumatoid arthritis, unspecified Quantity: 1 Electronic Signature(s) Signed: 07/02/2022 3:46:02 PM By: Shelby Corwin DO Signed: 07/02/2022 5:32:44 PM By: Karie Schwalbe RN Entered By: Karie Schwalbe on 07/02/2022 13:25:07

## 2022-07-05 NOTE — Progress Notes (Signed)
Shelby Walker, Shelby Walker ([id number]) [id number] Page 1 of 8 Visit Report for 07/02/2022 Arrival Information Details Patient Name: Date of Service: Shelby Walker, Shelby Walker 07/02/2022 12:30 PM Medical Record Number: [id number] Patient Account Number: [id number] Date of Birth/Sex: Treating RN: 1933-08-21 (89 y.o. [name] Primary Care Feather Berrie: Adrian Prince Other Clinician: Referring Lynesha Bango: Treating Olivine Hiers/Extender: Ignatius Specking in Treatment: 3 Visit Information History Since Last Visit Added or deleted any medications: No Patient Arrived: [name] Any new allergies or adverse reactions: No Arrival Time: 12:39 Had a fall or experienced change in No Accompanied By: daughter activities of daily living that may affect Transfer Assistance: None risk of falls: Patient Requires Transmission-Based Precautions: No Signs or symptoms of abuse/neglect since last visito No Patient Has Alerts: No Hospitalized since last visit: No Implantable device outside of the clinic excluding No cellular tissue based products placed in the center since last visit: Has Dressing in Place as Prescribed: Yes Pain Present Now: Yes Electronic Signature(s) Signed: 07/02/2022 5:32:44 PM By: Karie Schwalbe RN Entered By: Karie Schwalbe on 07/02/2022 12:44:58 -------------------------------------------------------------------------------- Clinic Level of Care Assessment Details Patient Name: Date of Service: Shelby, Walker 07/02/2022 12:30 PM Medical Record Number: [id number] Patient Account Number: [id number] Date of Birth/Sex: Treating RN: 04-Feb-1934 (88 y.o. [name] Primary Care Kim Lauver: Adrian Prince Other Clinician: Referring Gabbrielle Mcnicholas: Treating Denyce Harr/Extender: Ignatius Specking in Treatment: 3 Clinic Level of Care Assessment Items TOOL 4 Quantity Score X- 1 0 Use when only an EandM is performed on FOLLOW-UP visit ASSESSMENTS  - Nursing Assessment / Reassessment X- 1 10 Reassessment of Co-morbidities (includes updates in patient status) X- 1 5 Reassessment of Adherence to Treatment Plan ASSESSMENTS - Wound and Skin A ssessment / Reassessment X - Simple Wound Assessment / Reassessment - one wound 1 5 []  - 0 Complex Wound Assessment / Reassessment - multiple wounds []  - 0 Dermatologic / Skin Assessment (not related to wound area) ASSESSMENTS - Focused Assessment []  - 0 Circumferential Edema Measurements - multi extremities []  - 0 Nutritional Assessment / Counseling / Intervention []  - 0 Lower Extremity Assessment (monofilament, tuning fork, pulses) []  - 0 Peripheral Arterial Disease Assessment (using hand held doppler) ASSESSMENTS - Ostomy and/or Continence Assessment and Care []  - 0 Incontinence Assessment and Management []  - 0 Ostomy Care Assessment and Management (repouching, etc.) PROCESS - Coordination of Care X - Simple Patient / Family Education for ongoing care 1 [address] [place], [place] ([id number]) [id number] Page 2 of 8 []  - 0 Complex (extensive) Patient / Family Education for ongoing care X- 1 10 Staff obtains Consents, Records, T Results / Process Orders est X- 1 10 Staff telephones HHA, Nursing Homes / Clarify orders / etc []  - 0 Routine Transfer to another Facility (non-emergent condition) []  - 0 Routine Hospital Admission (non-emergent condition) []  - 0 New Admissions / Manufacturing engineer / Ordering NPWT Apligraf, etc. , []  - 0 Emergency Hospital Admission (emergent condition) X- 1 10 Simple Discharge Coordination []  - 0 Complex (extensive) Discharge Coordination PROCESS - Special Needs []  - 0 Pediatric / Minor Patient Management []  - 0 Isolation Patient Management []  - 0 Hearing / Language / Visual special needs []  - 0 Assessment of Community assistance (transportation, D/C planning, etc.) []  - 0 Additional assistance / Altered mentation []  -  0 Support Surface(s) Assessment (bed, cushion, seat, etc.) INTERVENTIONS - Wound Cleansing / Measurement X - Simple Wound Cleansing - one wound 1 5 []  - 0 Complex Wound  Cleansing - multiple wounds X- 1 5 Wound Imaging (photographs - any number of wounds) []  - 0 Wound Tracing (instead of photographs) X- 1 5 Simple Wound Measurement - one wound []  - 0 Complex Wound Measurement - multiple wounds INTERVENTIONS - Wound Dressings X - Small Wound Dressing one or multiple wounds 1 10 []  - 0 Medium Wound Dressing one or multiple wounds []  - 0 Large Wound Dressing one or multiple wounds []  - 0 Application of Medications - topical []  - 0 Application of Medications - injection INTERVENTIONS - Miscellaneous []  - 0 External ear exam []  - 0 Specimen Collection (cultures, biopsies, blood, body fluids, etc.) []  - 0 Specimen(s) / Culture(s) sent or taken to Lab for analysis []  - 0 Patient Transfer (multiple staff / Nurse, adult / Similar devices) []  - 0 Simple Staple / Suture removal (25 or less) []  - 0 Complex Staple / Suture removal (26 or more) []  - 0 Hypo / Hyperglycemic Management (close monitor of Blood Glucose) []  - 0 Ankle / Brachial Index (ABI) - do not check if billed separately X- 1 5 Vital Signs Has the patient been seen at the hospital within the last three years: Yes Total Score: 95 Level Of Care: New/Established - Level 3 Electronic Signature(s) Signed: 07/02/2022 5:32:44 PM By: Karie Schwalbe RN Entered By: Karie Schwalbe on 07/02/2022 13:24:58 Shelby Walker ([id number]) [id number] Page 3 of 8 -------------------------------------------------------------------------------- Encounter Discharge Information Details Patient Name: Date of Service: Shelby Walker, Shelby Walker 07/02/2022 12:30 PM Medical Record Number: [id number] Patient Account Number: [id number] Date of Birth/Sex: Treating RN: 1933/12/13 (89 y.o. [name] Primary Care Greysin Medlen:  Adrian Prince Other Clinician: Referring Dawson Albers: Treating Kaivon Livesey/Extender: Ignatius Specking in Treatment: 3 Encounter Discharge Information Items Discharge Condition: Stable Ambulatory Status: Wheelchair Discharge Destination: Home Transportation: Private Auto Accompanied By: self Schedule Follow-up Appointment: Yes Clinical Summary of Care: Patient Declined Electronic Signature(s) Signed: 07/02/2022 5:32:44 PM By: Karie Schwalbe RN Entered By: Karie Schwalbe on 07/02/2022 13:47:41 -------------------------------------------------------------------------------- Lower Extremity Assessment Details Patient Name: Date of Service: Shelby Walker, Shelby Walker 07/02/2022 12:30 PM Medical Record Number: [id number] Patient Account Number: [id number] Date of Birth/Sex: Treating RN: 1933/09/15 (89 y.o. [name] Primary Care Gavino Fouch: Adrian Prince Other Clinician: Referring Yaileen Hofferber: Treating Khalon Cansler/Extender: Ignatius Specking in Treatment: 3 Electronic Signature(s) Signed: 07/02/2022 5:32:44 PM By: Karie Schwalbe RN Entered By: Karie Schwalbe on 07/02/2022 12:47:27 -------------------------------------------------------------------------------- Multi Wound Chart Details Patient Name: Date of Service: Shelby Walker 07/02/2022 12:30 PM Medical Record Number: [id number] Patient Account Number: [id number] Date of Birth/Sex: Treating RN: [birth year] (89 y.o. F) Primary Care Keyia Moretto: Adrian Prince Other Clinician: Referring Micaiah Remillard: Treating Sameria Morss/Extender: Ignatius Specking in Treatment: 3 Vital Signs Height(in): 61 Pulse(bpm): 80 Weight(lbs): 110 Blood Pressure(mmHg): 118/62 Body Mass Index(BMI): 20.8 Temperature(F): 98.3 Respiratory Rate(breaths/min): 16 [1:Photos:] [N/A:N/A] VICKE, PLOTNER ([id number]) [1:Left Sacrum Wound Location: Gradually Appeared Wounding Event: Pressure Ulcer Primary Etiology: Type II  Diabetes, Osteoarthritis Comorbid History: 06/07/2022 Date Acquired: 3 Weeks of Treatment: Open Wound Status: No Wound Recurrence: 0.3x0.3x0.1  Measurements L x W x D (cm) 0.071 A (cm) : rea 0.007 Volume (cm) : 81.20% % Reduction in A rea: 81.60% % Reduction in Volume: Category/Stage III Classification: Medium Exudate A mount: Serosanguineous Exudate Type: red, brown Exudate Color: Distinct,  outline attached Wound Margin: Medium (34-66%) Granulation A mount: Red, Pink Granulation Quality: Medium (34-66%) Necrotic A mount: Fat Layer (Subcutaneous Tissue): Yes N/A Exposed Structures: Fascia: No  Tendon: No Muscle: No Joint: No Bone: No Small  (1-33%) Epithelialization: Excoriation: No Periwound Skin Texture: Induration: No Callus: No Crepitus: No Rash: No Scarring: No Maceration: No Periwound Skin Moisture: Dry/Scaly: No Atrophie Blanche: No Periwound Skin Color: Cyanosis: No Ecchymosis: No  Erythema: No Hemosiderin Staining: No Mottled: No Pallor: No Rubor: No No Abnormality Temperature:] [N/A:N/A N/A N/A N/A N/A N/A N/A N/A N/A N/A N/A N/A N/A N/A N/A N/A N/A N/A N/A N/A N/A N/A N/A N/A N/A N/A] Treatment Notes Electronic Signature(s) Signed: 07/02/2022 3:46:02 PM By: Geralyn Corwin DO Entered By: Geralyn Corwin on 07/02/2022 13:08:19 -------------------------------------------------------------------------------- Multi-Disciplinary Care Plan Details Patient Name: Date of Service: Shelby Walker. 07/02/2022 12:30 PM Medical Record Number: [id number] Patient Account Number: [id number] Date of Birth/Sex: Treating RN: [birth year] (89 y.o. [name] Primary Care Ananda Caya: Adrian Prince Other Clinician: Referring Avonell Lenig: Treating Brianna Esson/Extender: Ignatius Specking in Treatment: 3 Active Inactive Pain, Acute or Chronic Nursing Diagnoses: Pain, acute or chronic: actual or potential Potential alteration in comfort, pain Goals: Patient will verbalize adequate pain  control and receive pain control interventions during procedures as needed Date Initiated: 06/11/2022 Target Resolution Date: 08/12/2022 Goal Status: Active Interventions: Shelby Walker, Shelby Walker ([id number]) [phone].pdf Page 5 of 8 Encourage patient to take pain medications as prescribed Provide education on pain management Reposition patient for comfort Treatment Activities: Administer pain control measures as ordered : 06/11/2022 Notes: Wound/Skin Impairment Nursing Diagnoses: Knowledge deficit related to ulceration/compromised skin integrity Goals: Patient/caregiver will verbalize understanding of skin care regimen Date Initiated: 06/11/2022 Target Resolution Date: 08/13/2022 Goal Status: Active Interventions: Assess patient/caregiver ability to perform ulcer/skin care regimen upon admission and as needed Assess ulceration(s) every visit Provide education on ulcer and skin care Treatment Activities: Skin care regimen initiated : 06/11/2022 Topical wound management initiated : 06/11/2022 Notes: Electronic Signature(s) Signed: 07/02/2022 5:32:44 PM By: Karie Schwalbe RN Entered By: Karie Schwalbe on 07/02/2022 13:23:27 -------------------------------------------------------------------------------- Pain Assessment Details Patient Name: Date of Service: Shelby Walker, Shelby Walker 07/02/2022 12:30 PM Medical Record Number: [id number] Patient Account Number: [id number] Date of Birth/Sex: Treating RN: October 03, 1933 (89 y.o. [name] Primary Care Kiwan Gadsden: Adrian Prince Other Clinician: Referring Deandrew Hoecker: Treating Raymond Azure/Extender: Ignatius Specking in Treatment: 3 Active Problems Location of Pain Severity and Description of Pain Patient Has Paino Yes Site Locations Pain Location: Generalized Pain With Dressing Change: No Duration of the Pain. Constant / Intermittento Constant Rate the pain. Current Pain Level: 8 Worst Pain Level: 10 Least Pain  Level: 5 Tolerable Pain Level: 5 Character of Pain Describe the Pain: Throbbing Pain Management and Medication Current Pain Management: Medication: No Cold Application: No Shelby Walker, Shelby Walker ([id number]) [phone].pdf Page 6 of 8 Rest: No Massage: No Activity: No T.E.N.S.: No Heat Application: No Leg drop or elevation: No Is the Current Pain Management Adequate: Adequate Electronic Signature(s) Signed: 07/02/2022 5:32:44 PM By: Karie Schwalbe RN Entered By: Karie Schwalbe on 07/02/2022 12:47:19 -------------------------------------------------------------------------------- Patient/Caregiver Education Details Patient Name: Date of Service: Shelby Walker 4/29/2024andnbsp12:30 PM Medical Record Number: [id number] Patient Account Number: [id number] Date of Birth/Gender: Treating RN: [birth year] (89 y.o. [name] Primary Care Physician: Adrian Prince Other Clinician: Referring Physician: Treating Physician/Extender: Ignatius Specking in Treatment: 3 Education Assessment Education Provided To: Patient Education Topics Provided Electronic Signature(s) Signed: 07/02/2022 5:32:44 PM By: Karie Schwalbe RN Entered By: Karie Schwalbe on 07/02/2022 13:23:49 -------------------------------------------------------------------------------- Wound Assessment Details Patient Name: Date of Service: Shelby Walker. 07/02/2022 12:30 PM  Medical Record Number: [id number] Patient Account Number: [id number] Date of Birth/Sex: Treating RN: Feb 16, 1934 (89 y.o. [name] Primary Care Marilea Gwynne: Adrian Prince Other Clinician: Referring Siniya Lichty: Treating Zhuri Krass/Extender: Ignatius Specking in Treatment: 3 Wound Status Wound Number: 1 Primary Etiology: Pressure Ulcer Wound Location: Left Sacrum Wound Status: Open Wounding Event: Gradually Appeared Comorbid History: Type II Diabetes, Osteoarthritis Date Acquired:  06/07/2022 Weeks Of Treatment: 3 Clustered Wound: No Photos Wound Measurements Length: (cm) 0. Width: (cm) 0. Depth: (cm) 0. Area: (cm) 0 Shelby Walker, Shelby Walker ([id number]) Volume: (cm) 3 % Reduction in Area: 81.2% 3 % Reduction in Volume: 81.6% 1 Epithelialization: Small (1-33%) .071 Tunneling: No [id number].pdf Page 7 of 8 0.007 Undermining: No Wound Description Classification: Category/Stage III Wound Margin: Distinct, outline attached Exudate Amount: Medium Exudate Type: Serosanguineous Exudate Color: red, brown Foul Odor After Cleansing: No Slough/Fibrino Yes Wound Bed Granulation Amount: Medium (34-66%) Exposed Structure Granulation Quality: Red, Pink Fascia Exposed: No Necrotic Amount: Medium (34-66%) Fat Layer (Subcutaneous Tissue) Exposed: Yes Necrotic Quality: Adherent Slough Tendon Exposed: No Muscle Exposed: No Joint Exposed: No Bone Exposed: No Periwound Skin Texture Texture Color No Abnormalities Noted: No No Abnormalities Noted: No Callus: No Atrophie Blanche: No Crepitus: No Cyanosis: No Excoriation: No Ecchymosis: No Induration: No Erythema: No Rash: No Hemosiderin Staining: No Scarring: No Mottled: No Pallor: No Moisture Rubor: No No Abnormalities Noted: Yes Temperature / Pain Temperature: No Abnormality Treatment Notes Wound #1 (Sacrum) Wound Laterality: Left Cleanser Soap and Water Discharge Instruction: May shower and wash wound with dial antibacterial soap and water prior to dressing change. Wound Cleanser Discharge Instruction: Cleanse the wound with wound cleanser prior to applying a clean dressing using gauze sponges, not tissue or cotton balls. [employer] Ancillary Kit - 15 Day Supply Discharge Instruction: Use supplies as instructed; Kit contains: (15) Saline Bullets; (15) 3x3 Gauze; 15 pr Gloves Peri-Wound Care Skin Prep Discharge Instruction: Use skin prep as directed Topical Primary Dressing Hydrofera Blue  Ready Transfer Foam, 2.5x2.5 (in/in) Discharge Instruction: Apply directly to wound bed as directed MediHoney Gel, tube 1.5 (oz) Discharge Instruction: Apply to wound bed as instructed Secondary Dressing Zetuvit Plus Silicone Border Dressing 4x4 (in/in) Discharge Instruction: Apply silicone border over primary dressing as directed. Secured With Compression Wrap Compression Stockings Facilities manager) Signed: 07/02/2022 5:32:44 PM By: Karie Schwalbe RN Signed: 07/04/2022 4:40:31 PM By: Thayer Dallas Entered By: Thayer Dallas on 07/02/2022 12:54:16 Shelby Walker ([id number]) [id number] Page 8 of 8 -------------------------------------------------------------------------------- Vitals Details Patient Name: Date of Service: Shelby Walker, Shelby Walker 07/02/2022 12:30 PM Medical Record Number: [id number] Patient Account Number: [id number] Date of Birth/Sex: Treating RN: 1933/08/22 (89 y.o. [name] Primary Care Zane Samson: Adrian Prince Other Clinician: Referring Wrigley Winborne: Treating Neera Teng/Extender: Ignatius Specking in Treatment: 3 Vital Signs Time Taken: 12:44 Temperature (F): 98.3 Height (in): 61 Pulse (bpm): 80 Weight (lbs): 110 Respiratory Rate (breaths/min): 16 Body Mass Index (BMI): 20.8 Blood Pressure (mmHg): 118/62 Reference Range: 80 - 120 mg / dl Electronic Signature(s) Signed: 07/02/2022 5:32:44 PM By: Karie Schwalbe RN Entered By: Karie Schwalbe on 07/02/2022 12:45:53

## 2022-07-12 ENCOUNTER — Encounter (HOSPITAL_BASED_OUTPATIENT_CLINIC_OR_DEPARTMENT_OTHER): Payer: Medicare Other | Admitting: Internal Medicine

## 2022-07-12 DIAGNOSIS — T148XXA Other injury of unspecified body region, initial encounter: Secondary | ICD-10-CM | POA: Diagnosis not present

## 2022-07-12 DIAGNOSIS — M25522 Pain in left elbow: Secondary | ICD-10-CM | POA: Diagnosis not present

## 2022-07-12 DIAGNOSIS — W548XXA Other contact with dog, initial encounter: Secondary | ICD-10-CM | POA: Diagnosis not present

## 2022-07-12 DIAGNOSIS — M5416 Radiculopathy, lumbar region: Secondary | ICD-10-CM | POA: Diagnosis not present

## 2022-07-16 ENCOUNTER — Other Ambulatory Visit: Payer: Medicare Other

## 2022-07-16 ENCOUNTER — Encounter (HOSPITAL_BASED_OUTPATIENT_CLINIC_OR_DEPARTMENT_OTHER): Payer: Medicare Other | Attending: Internal Medicine | Admitting: Internal Medicine

## 2022-07-16 DIAGNOSIS — E11622 Type 2 diabetes mellitus with other skin ulcer: Secondary | ICD-10-CM

## 2022-07-16 DIAGNOSIS — M069 Rheumatoid arthritis, unspecified: Secondary | ICD-10-CM

## 2022-07-16 DIAGNOSIS — L89153 Pressure ulcer of sacral region, stage 3: Secondary | ICD-10-CM

## 2022-07-21 ENCOUNTER — Encounter: Payer: Self-pay | Admitting: Cardiovascular Disease

## 2022-07-21 NOTE — Progress Notes (Unsigned)
Cardiology Office Note   Date:  07/23/2022   ID:  Shelby Walker, DOB [birth year], MRN [id number]  PCP:  Adrian Prince, MD  Cardiologist:  Kristeen Miss, MD  ( transfer from Dr. Myrtis Ser)   Chief Complaint  Patient presents with   Atrial Fibrillation   carotid artery stenosis    follow-up palpitations and some chest discomfort in the past     Shelby Walker is a 89 y.o. female who presents today to follow-up a history of mild chest discomfort and palpitations in the past. She is feeling well and not having any significant symptoms.  Recently she had a mild case of shingles. She also had an upper respiratory infection that is being treated and improving.  Dec 09, 2015:  Shelby Walker is seen today for the first time - transfer from [facility]rt[facility] Ser Followed for palpitations with some associated chest pain  Stays active,  Does yard work.   These can last as long as 30 minutes .   No CP or palpitations with yard work . Typically occur at night  The CP starts first and then she has the palpitations  No recent myoview No recent monitor   August 09, 2016:  Shelby Walker is seen for follow up of her carotid disease  She had been having some chest pain . Myoview was low risk.  Still very active,  No further CP .   Has moderate carotid disease Has some burning in her legs with walking and at night   May 01, 2017.  Shelby Walker  is seen today for follow-up of her claudication and episodes of chest pain.3 She is getting over pneumonia .   Leg pain / claudication  has improved.   Has seen Dr. Okey Dupre for PAD .  She was found to have noncritical peripheral arterial disease.  She has not wanted to have any procedure and at this point she has not needed it. Dr. Evlyn Kanner follows / manages her lipids and diabetes.  Will request labs from recent visit   Oct. 2, 2019:  ShelbyWalker is seen today .  Seen with daughter , [name]  Had an episode of syncope several weeks ago ( was very hot , standing for 45 min at a funeral )  Had a  mild episode of lightheadeeness yesterday  Admits that she does eat well or drink enough Associated with a fast HR ( felt that her heart was pounding )  Has mild carotid disease. Scheduled to have repeat carotid duplex later this month  Jan. 14, 2021 Ms Walker is seen today for follow up of her hyperlipidemia, diabetes mellitus and chest pain and PAF  Her husband passed away this past 08/05/22  No CP or dyspnea.  No syncope  Has been found to have atrial fib and is on eliquis   October 16, 2019: [name] is seen today. She was seen by Tereso Newcomer, PA on July 7 for increasing shortness of breath, fatigue. Echocardiogram revealed normal left ventricular systolic function-ejection fraction is 60 to 65%..  She has mild diastolic dysfunction. Lower extremity duplex scan reveals stable bilateral peripheral arterial disease that is unchanged from previous study. She is scheduled for Lexiscan Myoview study next week.  She does not want to have the stress test.   September 21, 2020  Shelby Walker is seen for follow up for her chronic diastolic dysfunction, HLD , DM  Has claudication and has seen Dr. Okey Dupre. Had some chest pain this past 08/05/22 night Tried a SL  NTG - did not help Found her glucose to be low , [facility] Ho[facility] )  took a glucose tab Passed out , woke up on the ground ( I suspect from her hypoglycemia )  Had difficulty getting up  Event monitor was unremarkable   November 16, 2021: Seen with [name] ( daughter )  Case seen for follow-up of her diastolic dysfunction, hyperlipidemia, diabetes mellitus. Had some chest pain recently , lasted 10 -15 min. Took a SL NTG which helped.   Jul 23, 2022 Shelby Walker is seen for follow up of her diastolic CHF, chest pain  Seen with daughter, [name]   We prescribed Imdur.  But she is not on it due to low BP  Her pacer was infected and was removed.  Dr. Graciela Husbands is going to place a loop recorder soon     Past Medical History:  Diagnosis Date   Bruit    Carotid  Doppler showed no significant abnormality     9per patient)   C. difficile colitis 07/2018   with severe sepsis   Chest pain, unspecified    Nuclear, May, 2008, no scar or ischemia   Diabetes mellitus    Diverticulosis    Dyslipidemia    Ejection fraction    EF 55-60%, echo, February, 2011 // Echocardiogram 8/21: EF 60-65, no RWMA, Gr 1 DD, GLS -14%, normal RVSF, mild LAE, trivial MR, mild MS (mean 4 mmHg), RVSP 23.4    GERD (gastroesophageal reflux disease)    Mitral regurgitation 04/21/2009   mild,  echo, February, 2011   Osteoporosis    Palpitations    possible very brief atrial fibrillation on monitor and possible reentrant tachycardia   Psoriasis    Rheumatoid arthritis (HCC)     Past Surgical History:  Procedure Laterality Date   ABDOMINAL AORTOGRAM W/LOWER EXTREMITY Right 10/19/2020   Procedure: ABDOMINAL AORTOGRAM W/LOWER EXTREMITY;  Surgeon: Iran Ouch, MD;  Location: [facility];  Service: Cardiovascular;  Laterality: Right;   FRACTURE SURGERY     LOOP RECORDER REMOVAL N/A 01/05/2022   Procedure: LOOP RECORDER REMOVAL;  Surgeon: Marinus Maw, MD;  Location: [facility];  Service: Cardiovascular;  Laterality: N/A;   PACEMAKER IMPLANT N/A 01/05/2022   Procedure: PACEMAKER IMPLANT;  Surgeon: Marinus Maw, MD;  Location: [facility];  Service: Cardiovascular;  Laterality: N/A;   PPM GENERATOR REMOVAL N/A 01/29/2022   Procedure: PPM GENERATOR REMOVAL;  Surgeon: Marinus Maw, MD;  Location: [facility];  Service: Cardiovascular;  Laterality: N/A;   TEE WITHOUT CARDIOVERSION N/A 01/29/2022   Procedure: TRANSESOPHAGEAL ECHOCARDIOGRAM (TEE);  Surgeon: Sande Rives, MD;  Location: [facility];  Service: Cardiovascular;  Laterality: N/A;   TRANSMETATARSAL AMPUTATION Right 01/18/2021   Procedure: TRANSMETATARSAL AMPUTATION;  Surgeon: Toni Arthurs, MD;  Location: [facility] OR;  Service: Orthopedics;  Laterality: Right;    Patient Active  Problem List   Diagnosis Date Noted   Paroxysmal atrial fibrillation [facility])     Priority: High   Sacral wound 05/14/2022   Coronary artery disease involving native coronary artery of native heart without angina pectoris 02/21/2022   Discitis of lumbar region 02/05/2022   Herpes zoster without complication 02/05/2022   Clostridium difficile colitis 02/05/2022   Severe sepsis with acute organ dysfunction due to Gram positive bacteria (HCC) 02/02/2022   Pacemaker infection (HCC) 02/01/2022   Periostitis of lumbar spine without osteomyelitis (HCC) 02/01/2022   Acute midline low back pain without sciatica 01/26/2022  MSSA bacteremia 01/25/2022   Presence of cardiac pacemaker 01/25/2022   Severe sepsis with septic shock (HCC) 01/24/2022   AKI (acute kidney injury) (HCC) 01/24/2022   Sinus pause 01/04/2022   Acute viral bronchitis 02/06/2021   Acute blood loss anemia    Postoperative pain    S/P transmetatarsal amputation of foot, right (HCC) 01/24/2021   Protein-calorie malnutrition, severe 01/17/2021   Diabetic foot infection (HCC) 01/16/2021   Rheumatoid arthritis (HCC) 01/16/2021   Stage 3b chronic kidney disease (CKD) (HCC) 01/16/2021   DNR (do not resuscitate) 01/16/2021   Decreased anal sphincter tone 12/13/2018   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   Irritable bowel syndrome with diarrhea - post infectious 12/12/2018   Full incontinence of feces and fecal smearing 12/12/2018   Perianal dermatitis 12/12/2018   Uncontrolled type 2 diabetes mellitus with hyperglycemia (HCC) 10/26/2018   Long-term use of immunosuppressant medication-Enbrel 10/26/2018   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 08/27/2018   Thrombocytopenia (HCC)    Essential hypertension    Dyspnea on exertion    Steroid-induced hyperglycemia    Fatigue 08/06/2018   Mixed hyperlipidemia 09/29/2016   Stable angina 09/29/2016   PAD (peripheral artery disease) (HCC) 08/09/2016   Preoperative clearance 05/21/2012    Precordial chest pain    Palpitations    Diabetes mellitus (HCC)    GERD (gastroesophageal reflux disease)    Ejection fraction    Carotid artery disease (HCC)    Mitral regurgitation 04/21/2009   PSORIASIS 02/09/2009   Rheumatoid arthritis flare (HCC) 02/09/2009      Current Outpatient Medications  Medication Sig Dispense Refill   acetaminophen (TYLENOL) 325 MG tablet Take 1 tablet (325 mg total) by mouth every 6 (six) hours.     amiodarone (PACERONE) 200 MG tablet Take 0.5 tablets (100 mg total) by mouth daily. 15 tablet 11   atorvastatin (LIPITOR) 20 MG tablet Take 1 tablet (20 mg total) by mouth daily. 90 tablet 3   Calcium Carbonate-Vitamin D 600-400 MG-UNIT tablet Take 1 tablet by mouth daily at 12 noon.      cefadroxil (DURICEF) 500 MG capsule Take 1 capsule (500 mg total) by mouth 2 (two) times daily. (Patient not taking: Reported on 07/23/2022) 20 capsule 0   Chlorhexidine Gluconate Cloth 2 % PADS Apply 6 each topically every 12 (twelve) hours. (Patient not taking: Reported on 07/23/2022)     diclofenac Sodium (VOLTAREN) 1 % GEL Apply 4 g topically 4 (four) times daily.     feeding supplement (ENSURE ENLIVE / ENSURE PLUS) LIQD Take 237 mLs by mouth 2 (two) times daily with a meal. 237 mL 12   gabapentin (NEURONTIN) 100 MG capsule Take 1 capsule (100 mg total) by mouth 3 (three) times daily. 90 capsule 0   insulin aspart (NOVOLOG) 100 UNIT/ML FlexPen Inject 2 Units into the skin 3 (three) times daily with meals. Sliding scale 15 mL 11   Insulin Degludec (TRESIBA) 100 UNIT/ML SOLN Inject 4 Units into the skin at bedtime.     lidocaine (LIDODERM) 5 % Place 1 patch onto the skin daily. Remove & Discard patch within 12 hours or as directed by MD 30 patch 0   megestrol (MEGACE) 400 MG/10ML suspension Take 10 mLs (400 mg total) by mouth 2 (two) times daily. (Patient not taking: Reported on 07/23/2022) 480 mL 0   methocarbamol (ROBAXIN) 500 MG tablet Take 1 tablet (500 mg total) by mouth  every 6 (six) hours as needed for muscle spasms. 15 tablet 0  Multiple Vitamins-Minerals (CENTRUM SILVER 50+WOMEN PO) Take 1 tablet by mouth daily.     nitroGLYCERIN (NITROSTAT) 0.4 MG SL tablet Dissolve 1 tablet under the tongue every 5 minutes as needed for chest pain. Max of 3 doses, then 911. 25 tablet 6   nystatin (MYCOSTATIN) 100000 UNIT/ML suspension  (Patient not taking: Reported on 07/23/2022)     pantoprazole (PROTONIX) 40 MG tablet Take 1 tablet (40 mg total) by mouth daily.     polyethylene glycol (MIRALAX / GLYCOLAX) 17 g packet Take 17 g by mouth daily as needed for mild constipation. 14 each 0   predniSONE (DELTASONE) 5 MG tablet Take 1 tablet (5 mg total) by mouth daily with breakfast. 30 tablet 0   pyrithione zinc (SELSUN BLUE DRY SCALP) 1 % shampoo Apply 1 application topically once a week.     RESTASIS 0.05 % ophthalmic emulsion Place 1 drop into both eyes 2 (two) times daily.     saccharomyces boulardii (FLORASTOR) 250 MG capsule Take 250 mg by mouth 2 (two) times daily.     silver sulfADIAZINE (SILVADENE) 1 % cream Apply 1 Application topically daily. 25 g 1   sodium chloride 0.9 % infusion Inject 0-10 mLs into the vein as needed (for administration of IV medications (carrier fluid)). (Patient not taking: Reported on 05/14/2022)  0   traZODone (DESYREL) 50 MG tablet Take 0.5-1 tablets (25-50 mg total) by mouth at bedtime as needed for sleep. 15 tablet 0   No current facility-administered medications for this visit.    Allergies:   Cholestyramine, Compazine [prochlorperazine edisylate], Celecoxib, Sulfonamide derivatives, Hydrocodone, Infliximab, Leflunomide, Prochlorperazine, Rosuvastatin, and Sulfa antibiotics    Social History:  The patient  reports that she has never smoked. She has never used smokeless tobacco. She reports that she does not drink alcohol and does not use drugs.   Family History:  The patient's family history includes Diabetes in her father and mother;  Heart attack in her father and mother; Heart failure in her father and mother; Stroke in her sister.    ROS: As per current history.  Other systems are negative.   Physical Exam: Blood pressure (!) 135/50, pulse 72, height 5' 1.5" (1.562 m), weight 112 lb 12.8 oz (51.2 kg), SpO2 95 %.       GEN:  elderly female,   in no acute distress HEENT: Normal NECK: No JVD; moderate left carotid bruit  LYMPHATICS: No lymphadenopathy CARDIAC: RRR ,  2-3/6 systolic murmur , radiation to Left  ax line  RESPIRATORY:  Clear to auscultation without rales, wheezing or rhonchi  ABDOMEN: Soft, non-tender, non-distended MUSCULOSKELETAL:  No edema; arthritis hands  SKIN: Warm and dry NEUROLOGIC:  Alert and oriented x 3    EKG:    Recent Labs: 01/25/2022: B Natriuretic Peptide 2,919.6 02/02/2022: Magnesium 1.7 05/01/2022: ALT 12; BUN 44; Creat 1.58; Hemoglobin 9.6; Platelets 208; Potassium 4.5; Sodium 140    Lipid Panel    Component Value Date/Time   CHOL 149 01/05/2022 0437   CHOL 129 12/24/2016 1131   TRIG 138 01/05/2022 0437   HDL 50 01/05/2022 0437   HDL 52 12/24/2016 1131   CHOLHDL 3.0 01/05/2022 0437   VLDL 28 01/05/2022 0437   LDLCALC 71 01/05/2022 0437   LDLCALC 47 12/24/2016 1131      Wt Readings from Last 3 Encounters:  07/23/22 112 lb 12.8 oz (51.2 kg)  05/28/22 111 lb (50.3 kg)  04/02/22 114 lb (51.7 kg)      Current  medicines are reviewed  The patient understands her medications well.     ASSESSMENT AND PLAN:  1. Atrial fib :  stable  Her pacer was removed due to infection. Dr. Graciela Husbands will be placing an implantable loop soon    2. Left carotid bruit: -      stable   3. Mitral regurgitation  :  stable      .      Kristeen Miss, MD  07/23/2022 11:15 AM    [facility] [address] [place],  [address] [place], [place]  [address] Pager [phone] Phone: [phone]; Fax: [phone]

## 2022-07-23 ENCOUNTER — Encounter: Payer: Self-pay | Admitting: Cardiovascular Disease

## 2022-07-23 ENCOUNTER — Ambulatory Visit: Payer: Medicare Other | Attending: Cardiovascular Disease | Admitting: Cardiovascular Disease

## 2022-07-23 VITALS — BP 135/50 | HR 72 | Ht 61.5 in | Wt 112.8 lb

## 2022-07-23 DIAGNOSIS — I340 Nonrheumatic mitral (valve) insufficiency: Secondary | ICD-10-CM | POA: Diagnosis not present

## 2022-07-23 DIAGNOSIS — I10 Essential (primary) hypertension: Secondary | ICD-10-CM

## 2022-07-23 MED ORDER — NITROGLYCERIN 0.4 MG SL SUBL: SUBLINGUAL_TABLET | SUBLINGUAL | 3 refills | Status: DC

## 2022-07-23 NOTE — Patient Instructions (Signed)
Medication Instructions:  Your physician recommends that you continue on your current medications as directed. Please refer to the Current Medication list given to you today.  *If you need a refill on your cardiac medications before your next appointment, please call your pharmacy*     Follow-Up: At [facility], you and your health needs are our priority.  As part of our continuing mission to provide you with exceptional heart care, we have created designated Provider Care Teams.  These Care Teams include your primary Cardiologist (physician) and Advanced Practice Providers (APPs -  Physician Assistants and Nurse Practitioners) who all work together to provide you with the care you need, when you need it.   Your next appointment:   1 year(s)  Provider:   Philip Nahser, MD     

## 2022-07-24 DIAGNOSIS — Z79899 Other long term (current) drug therapy: Secondary | ICD-10-CM | POA: Diagnosis not present

## 2022-07-24 DIAGNOSIS — M810 Age-related osteoporosis without current pathological fracture: Secondary | ICD-10-CM | POA: Diagnosis not present

## 2022-07-24 DIAGNOSIS — L409 Psoriasis, unspecified: Secondary | ICD-10-CM | POA: Diagnosis not present

## 2022-07-24 DIAGNOSIS — M069 Rheumatoid arthritis, unspecified: Secondary | ICD-10-CM | POA: Diagnosis not present

## 2022-07-24 DIAGNOSIS — S91109A Unspecified open wound of unspecified toe(s) without damage to nail, initial encounter: Secondary | ICD-10-CM | POA: Diagnosis not present

## 2022-07-24 DIAGNOSIS — M1991 Primary osteoarthritis, unspecified site: Secondary | ICD-10-CM | POA: Diagnosis not present

## 2022-07-24 DIAGNOSIS — Z6821 Body mass index (BMI) 21.0-21.9, adult: Secondary | ICD-10-CM | POA: Diagnosis not present

## 2022-07-24 DIAGNOSIS — M7062 Trochanteric bursitis, left hip: Secondary | ICD-10-CM | POA: Diagnosis not present

## 2022-07-24 DIAGNOSIS — M25562 Pain in left knee: Secondary | ICD-10-CM | POA: Diagnosis not present

## 2022-07-24 DIAGNOSIS — M5136 Other intervertebral disc degeneration, lumbar region: Secondary | ICD-10-CM | POA: Diagnosis not present

## 2022-08-06 DIAGNOSIS — M5416 Radiculopathy, lumbar region: Secondary | ICD-10-CM | POA: Diagnosis not present

## 2022-08-07 ENCOUNTER — Emergency Department (HOSPITAL_COMMUNITY): Payer: Medicare Other

## 2022-08-07 ENCOUNTER — Encounter (HOSPITAL_COMMUNITY): Payer: Self-pay | Admitting: Family Medicine

## 2022-08-07 ENCOUNTER — Inpatient Hospital Stay (HOSPITAL_COMMUNITY)
Admission: EM | Admit: 2022-08-07 | Discharge: 2022-08-12 | DRG: 853 | Disposition: A | Discharge status: Home Health | Payer: Medicare Other | Attending: Internal Medicine | Admitting: Internal Medicine

## 2022-08-07 DIAGNOSIS — A419 Sepsis, unspecified organism: Secondary | ICD-10-CM | POA: Diagnosis not present

## 2022-08-07 DIAGNOSIS — R1111 Vomiting without nausea: Secondary | ICD-10-CM | POA: Diagnosis not present

## 2022-08-07 DIAGNOSIS — Z794 Long term (current) use of insulin: Secondary | ICD-10-CM

## 2022-08-07 DIAGNOSIS — I5032 Chronic diastolic (congestive) heart failure: Secondary | ICD-10-CM | POA: Diagnosis present

## 2022-08-07 DIAGNOSIS — K8010 Calculus of gallbladder with chronic cholecystitis without obstruction: Secondary | ICD-10-CM | POA: Diagnosis not present

## 2022-08-07 DIAGNOSIS — K8060 Calculus of gallbladder and bile duct with cholecystitis, unspecified, without obstruction: Secondary | ICD-10-CM | POA: Diagnosis present

## 2022-08-07 DIAGNOSIS — I130 Hypertensive heart and chronic kidney disease with heart failure and stage 1 through stage 4 chronic kidney disease, or unspecified chronic kidney disease: Secondary | ICD-10-CM | POA: Diagnosis not present

## 2022-08-07 DIAGNOSIS — E876 Hypokalemia: Secondary | ICD-10-CM | POA: Diagnosis not present

## 2022-08-07 DIAGNOSIS — R7401 Elevation of levels of liver transaminase levels: Secondary | ICD-10-CM | POA: Diagnosis present

## 2022-08-07 DIAGNOSIS — K838 Other specified diseases of biliary tract: Secondary | ICD-10-CM | POA: Diagnosis not present

## 2022-08-07 DIAGNOSIS — A4159 Other Gram-negative sepsis: Secondary | ICD-10-CM | POA: Diagnosis present

## 2022-08-07 DIAGNOSIS — E1165 Type 2 diabetes mellitus with hyperglycemia: Secondary | ICD-10-CM | POA: Diagnosis present

## 2022-08-07 DIAGNOSIS — E1122 Type 2 diabetes mellitus with diabetic chronic kidney disease: Secondary | ICD-10-CM | POA: Diagnosis not present

## 2022-08-07 DIAGNOSIS — R1011 Right upper quadrant pain: Secondary | ICD-10-CM | POA: Diagnosis not present

## 2022-08-07 DIAGNOSIS — R079 Chest pain, unspecified: Secondary | ICD-10-CM | POA: Diagnosis not present

## 2022-08-07 DIAGNOSIS — N179 Acute kidney failure, unspecified: Secondary | ICD-10-CM | POA: Diagnosis present

## 2022-08-07 DIAGNOSIS — I959 Hypotension, unspecified: Secondary | ICD-10-CM | POA: Diagnosis not present

## 2022-08-07 DIAGNOSIS — I2510 Atherosclerotic heart disease of native coronary artery without angina pectoris: Secondary | ICD-10-CM | POA: Diagnosis present

## 2022-08-07 DIAGNOSIS — I480 Paroxysmal atrial fibrillation: Secondary | ICD-10-CM | POA: Diagnosis present

## 2022-08-07 DIAGNOSIS — K851 Biliary acute pancreatitis without necrosis or infection: Secondary | ICD-10-CM | POA: Diagnosis present

## 2022-08-07 DIAGNOSIS — M069 Rheumatoid arthritis, unspecified: Secondary | ICD-10-CM | POA: Diagnosis not present

## 2022-08-07 DIAGNOSIS — E785 Hyperlipidemia, unspecified: Secondary | ICD-10-CM | POA: Diagnosis present

## 2022-08-07 DIAGNOSIS — I340 Nonrheumatic mitral (valve) insufficiency: Secondary | ICD-10-CM | POA: Diagnosis present

## 2022-08-07 DIAGNOSIS — R935 Abnormal findings on diagnostic imaging of other abdominal regions, including retroperitoneum: Secondary | ICD-10-CM | POA: Diagnosis not present

## 2022-08-07 DIAGNOSIS — N281 Cyst of kidney, acquired: Secondary | ICD-10-CM | POA: Diagnosis not present

## 2022-08-07 DIAGNOSIS — K219 Gastro-esophageal reflux disease without esophagitis: Secondary | ICD-10-CM | POA: Diagnosis present

## 2022-08-07 DIAGNOSIS — R0602 Shortness of breath: Secondary | ICD-10-CM | POA: Diagnosis not present

## 2022-08-07 DIAGNOSIS — R0789 Other chest pain: Secondary | ICD-10-CM | POA: Diagnosis not present

## 2022-08-07 DIAGNOSIS — R1013 Epigastric pain: Secondary | ICD-10-CM | POA: Diagnosis not present

## 2022-08-07 DIAGNOSIS — Z882 Allergy status to sulfonamides status: Secondary | ICD-10-CM

## 2022-08-07 DIAGNOSIS — I6529 Occlusion and stenosis of unspecified carotid artery: Secondary | ICD-10-CM | POA: Diagnosis present

## 2022-08-07 DIAGNOSIS — K8309 Other cholangitis: Secondary | ICD-10-CM | POA: Diagnosis present

## 2022-08-07 DIAGNOSIS — I4891 Unspecified atrial fibrillation: Secondary | ICD-10-CM | POA: Diagnosis not present

## 2022-08-07 DIAGNOSIS — Z888 Allergy status to other drugs, medicaments and biological substances status: Secondary | ICD-10-CM

## 2022-08-07 DIAGNOSIS — Z79899 Other long term (current) drug therapy: Secondary | ICD-10-CM

## 2022-08-07 DIAGNOSIS — Z823 Family history of stroke: Secondary | ICD-10-CM

## 2022-08-07 DIAGNOSIS — M810 Age-related osteoporosis without current pathological fracture: Secondary | ICD-10-CM | POA: Diagnosis present

## 2022-08-07 DIAGNOSIS — Z66 Do not resuscitate: Secondary | ICD-10-CM | POA: Diagnosis not present

## 2022-08-07 DIAGNOSIS — R6520 Severe sepsis without septic shock: Secondary | ICD-10-CM | POA: Diagnosis present

## 2022-08-07 DIAGNOSIS — Z8249 Family history of ischemic heart disease and other diseases of the circulatory system: Secondary | ICD-10-CM

## 2022-08-07 DIAGNOSIS — R748 Abnormal levels of other serum enzymes: Secondary | ICD-10-CM | POA: Diagnosis present

## 2022-08-07 DIAGNOSIS — E1151 Type 2 diabetes mellitus with diabetic peripheral angiopathy without gangrene: Secondary | ICD-10-CM | POA: Diagnosis not present

## 2022-08-07 DIAGNOSIS — K5730 Diverticulosis of large intestine without perforation or abscess without bleeding: Secondary | ICD-10-CM | POA: Diagnosis present

## 2022-08-07 DIAGNOSIS — R7989 Other specified abnormal findings of blood chemistry: Secondary | ICD-10-CM | POA: Diagnosis not present

## 2022-08-07 DIAGNOSIS — Z885 Allergy status to narcotic agent status: Secondary | ICD-10-CM

## 2022-08-07 DIAGNOSIS — Z833 Family history of diabetes mellitus: Secondary | ICD-10-CM

## 2022-08-07 DIAGNOSIS — E875 Hyperkalemia: Secondary | ICD-10-CM | POA: Diagnosis not present

## 2022-08-07 DIAGNOSIS — I10 Essential (primary) hypertension: Secondary | ICD-10-CM | POA: Diagnosis not present

## 2022-08-07 DIAGNOSIS — N1832 Chronic kidney disease, stage 3b: Secondary | ICD-10-CM | POA: Diagnosis not present

## 2022-08-07 DIAGNOSIS — Z7952 Long term (current) use of systemic steroids: Secondary | ICD-10-CM | POA: Diagnosis not present

## 2022-08-07 DIAGNOSIS — K828 Other specified diseases of gallbladder: Secondary | ICD-10-CM | POA: Diagnosis not present

## 2022-08-07 DIAGNOSIS — K8020 Calculus of gallbladder without cholecystitis without obstruction: Secondary | ICD-10-CM | POA: Diagnosis not present

## 2022-08-07 DIAGNOSIS — Z01818 Encounter for other preprocedural examination: Secondary | ICD-10-CM | POA: Diagnosis not present

## 2022-08-07 DIAGNOSIS — M1990 Unspecified osteoarthritis, unspecified site: Secondary | ICD-10-CM | POA: Diagnosis present

## 2022-08-07 DIAGNOSIS — E119 Type 2 diabetes mellitus without complications: Secondary | ICD-10-CM | POA: Diagnosis not present

## 2022-08-07 HISTORY — DX: Nausea with vomiting, unspecified: R11.2

## 2022-08-07 HISTORY — DX: Other specified postprocedural states: Z98.890

## 2022-08-07 LAB — COMPREHENSIVE METABOLIC PANEL
ALT: 149 U/L — ABNORMAL HIGH (ref 0–44)
AST: 310 U/L — ABNORMAL HIGH (ref 15–41)
Albumin: 3.1 g/dL — ABNORMAL LOW (ref 3.5–5.0)
Alkaline Phosphatase: 59 U/L (ref 38–126)
Anion gap: 12 (ref 5–15)
BUN: 63 mg/dL — ABNORMAL HIGH (ref 8–23)
CO2: 21 mmol/L — ABNORMAL LOW (ref 22–32)
Calcium: 8.5 mg/dL — ABNORMAL LOW (ref 8.9–10.3)
Chloride: 107 mmol/L (ref 98–111)
Creatinine, Ser: 1.45 mg/dL — ABNORMAL HIGH (ref 0.44–1.00)
GFR, Estimated: 35 mL/min — ABNORMAL LOW (ref >60)
Glucose, Bld: 228 mg/dL — ABNORMAL HIGH (ref 70–99)
Potassium: 4.2 mmol/L (ref 3.5–5.1)
Sodium: 140 mmol/L (ref 135–145)
Total Bilirubin: 1.2 mg/dL (ref 0.3–1.2)
Total Protein: 5.8 g/dL — ABNORMAL LOW (ref 6.5–8.1)

## 2022-08-07 LAB — CBC WITH DIFFERENTIAL/PLATELET
Abs Immature Granulocytes: 0.04 10*3/uL (ref 0.00–0.07)
Basophils Absolute: 0 10*3/uL (ref 0.0–0.1)
Basophils Relative: 0 %
Eosinophils Absolute: 0 10*3/uL (ref 0.0–0.5)
Eosinophils Relative: 0 %
HCT: 30 % — ABNORMAL LOW (ref 36.0–46.0)
Hemoglobin: 9.2 g/dL — ABNORMAL LOW (ref 12.0–15.0)
Immature Granulocytes: 0 %
Lymphocytes Relative: 7 %
Lymphs Abs: 0.6 10*3/uL — ABNORMAL LOW (ref 0.7–4.0)
MCH: 28.3 pg (ref 26.0–34.0)
MCHC: 30.7 g/dL (ref 30.0–36.0)
MCV: 92.3 fL (ref 80.0–100.0)
Monocytes Absolute: 0.2 10*3/uL (ref 0.1–1.0)
Monocytes Relative: 2 %
Neutro Abs: 8.7 10*3/uL — ABNORMAL HIGH (ref 1.7–7.7)
Neutrophils Relative %: 91 %
Platelets: 194 10*3/uL (ref 150–400)
RBC: 3.25 MIL/uL — ABNORMAL LOW (ref 3.87–5.11)
RDW: 16.1 % — ABNORMAL HIGH (ref 11.5–15.5)
WBC: 9.6 10*3/uL (ref 4.0–10.5)
nRBC: 0 % (ref 0.0–0.2)

## 2022-08-07 LAB — LACTIC ACID, PLASMA
Lactic Acid, Venous: 3.5 mmol/L (ref 0.5–1.9)
Lactic Acid, Venous: 3.4 mmol/L (ref 0.5–1.9)

## 2022-08-07 LAB — TROPONIN I (HIGH SENSITIVITY)
Troponin I (High Sensitivity): 7 ng/L (ref <18)
Troponin I (High Sensitivity): 11 ng/L (ref <18)

## 2022-08-07 LAB — CBG MONITORING, ED: Glucose-Capillary: 177 mg/dL — ABNORMAL HIGH (ref 70–99)

## 2022-08-07 LAB — LIPASE, BLOOD: Lipase: 8872 U/L — ABNORMAL HIGH (ref 11–51)

## 2022-08-07 MED ORDER — ATORVASTATIN CALCIUM 10 MG PO TABS
20.0000 mg | ORAL_TABLET | Freq: Every day | ORAL | Status: DC
  Administered 2022-08-08 – 2022-08-12 (×5): 20 mg via ORAL
  Filled 2022-08-07 (×5): qty 2

## 2022-08-07 MED ORDER — LACTATED RINGERS IV BOLUS
1000.0000 mL | Freq: Once | INTRAVENOUS | Status: AC
  Administered 2022-08-07: 1000 mL via INTRAVENOUS

## 2022-08-07 MED ORDER — IOHEXOL 350 MG/ML SOLN
50.0000 mL | Freq: Once | INTRAVENOUS | Status: AC | PRN
  Administered 2022-08-07: 50 mL via INTRAVENOUS

## 2022-08-07 MED ORDER — ACETAMINOPHEN 650 MG RE SUPP: 650.0000 mg | Freq: Four times a day (QID) | RECTAL | Status: DC | PRN

## 2022-08-07 MED ORDER — METHOCARBAMOL 500 MG PO TABS
500.0000 mg | ORAL_TABLET | Freq: Four times a day (QID) | ORAL | Status: DC | PRN
  Administered 2022-08-08 – 2022-08-10 (×2): 500 mg via ORAL
  Filled 2022-08-07 (×2): qty 1

## 2022-08-07 MED ORDER — AMIODARONE HCL 200 MG PO TABS
100.0000 mg | ORAL_TABLET | Freq: Every day | ORAL | Status: DC
  Administered 2022-08-08 – 2022-08-12 (×5): 100 mg via ORAL
  Filled 2022-08-07 (×5): qty 1

## 2022-08-07 MED ORDER — ONDANSETRON HCL 4 MG PO TABS: 4.0000 mg | ORAL_TABLET | Freq: Four times a day (QID) | ORAL | Status: DC | PRN

## 2022-08-07 MED ORDER — PIPERACILLIN-TAZOBACTAM 3.375 G IVPB
3.3750 g | Freq: Three times a day (TID) | INTRAVENOUS | Status: DC
  Administered 2022-08-08 (×2): 3.375 g via INTRAVENOUS
  Filled 2022-08-07 (×2): qty 50

## 2022-08-07 MED ORDER — INSULIN ASPART 100 UNIT/ML IJ SOLN
0.0000 [IU] | INTRAMUSCULAR | Status: DC
  Administered 2022-08-07 – 2022-08-09 (×5): 1 [IU] via SUBCUTANEOUS
  Administered 2022-08-10 (×2): 3 [IU] via SUBCUTANEOUS
  Administered 2022-08-10 (×2): 4 [IU] via SUBCUTANEOUS
  Administered 2022-08-11: 2 [IU] via SUBCUTANEOUS
  Administered 2022-08-11: 4 [IU] via SUBCUTANEOUS
  Administered 2022-08-11: 2 [IU] via SUBCUTANEOUS
  Administered 2022-08-11: 1 [IU] via SUBCUTANEOUS
  Administered 2022-08-11: 2 [IU] via SUBCUTANEOUS
  Administered 2022-08-12: 3 [IU] via SUBCUTANEOUS

## 2022-08-07 MED ORDER — PIPERACILLIN-TAZOBACTAM 3.375 G IVPB 30 MIN
3.3750 g | Freq: Once | INTRAVENOUS | Status: AC
  Administered 2022-08-07: 3.375 g via INTRAVENOUS
  Filled 2022-08-07: qty 50

## 2022-08-07 MED ORDER — PREDNISONE 5 MG PO TABS: 5.0000 mg | ORAL_TABLET | Freq: Every day | ORAL | Status: DC

## 2022-08-07 MED ORDER — SODIUM CHLORIDE 0.9% FLUSH
3.0000 mL | Freq: Two times a day (BID) | INTRAVENOUS | Status: DC
  Administered 2022-08-08 – 2022-08-12 (×8): 3 mL via INTRAVENOUS

## 2022-08-07 MED ORDER — FENTANYL CITRATE PF 50 MCG/ML IJ SOSY
25.0000 ug | PREFILLED_SYRINGE | Freq: Once | INTRAMUSCULAR | Status: AC
  Administered 2022-08-07: 25 ug via INTRAVENOUS
  Filled 2022-08-07: qty 1

## 2022-08-07 MED ORDER — LACTATED RINGERS IV SOLN: INTRAVENOUS | Status: DC

## 2022-08-07 MED ORDER — ONDANSETRON HCL 4 MG/2ML IJ SOLN: 4.0000 mg | Freq: Four times a day (QID) | INTRAMUSCULAR | Status: DC | PRN

## 2022-08-07 MED ORDER — MIRTAZAPINE 15 MG PO TABS
7.5000 mg | ORAL_TABLET | Freq: Every day | ORAL | Status: DC
  Administered 2022-08-07 – 2022-08-11 (×5): 7.5 mg via ORAL
  Filled 2022-08-07 (×5): qty 1

## 2022-08-07 MED ORDER — CYCLOSPORINE 0.05 % OP EMUL
1.0000 [drp] | Freq: Two times a day (BID) | OPHTHALMIC | Status: DC
  Administered 2022-08-08 – 2022-08-12 (×10): 1 [drp] via OPHTHALMIC
  Filled 2022-08-07 (×10): qty 30

## 2022-08-07 MED ORDER — DICLOFENAC SODIUM 1 % EX GEL
4.0000 g | Freq: Three times a day (TID) | CUTANEOUS | Status: DC
  Administered 2022-08-08 – 2022-08-11 (×10): 4 g via TOPICAL
  Filled 2022-08-07: qty 100

## 2022-08-07 MED ORDER — ONDANSETRON HCL 4 MG/2ML IJ SOLN
4.0000 mg | Freq: Once | INTRAMUSCULAR | Status: AC
  Administered 2022-08-07: 4 mg via INTRAVENOUS
  Filled 2022-08-07: qty 2

## 2022-08-07 MED ORDER — HEPARIN SODIUM (PORCINE) 5000 UNIT/ML IJ SOLN
5000.0000 [IU] | Freq: Three times a day (TID) | INTRAMUSCULAR | Status: DC
  Administered 2022-08-07 – 2022-08-12 (×12): 5000 [IU] via SUBCUTANEOUS
  Filled 2022-08-07 (×12): qty 1

## 2022-08-07 MED ORDER — GABAPENTIN 100 MG PO CAPS
200.0000 mg | ORAL_CAPSULE | Freq: Three times a day (TID) | ORAL | Status: DC
  Administered 2022-08-07 – 2022-08-12 (×13): 200 mg via ORAL
  Filled 2022-08-07 (×13): qty 2

## 2022-08-07 MED ORDER — FENTANYL CITRATE PF 50 MCG/ML IJ SOSY
12.5000 ug | PREFILLED_SYRINGE | INTRAMUSCULAR | Status: DC | PRN
  Administered 2022-08-08 (×2): 25 ug via INTRAVENOUS
  Administered 2022-08-09 – 2022-08-11 (×5): 50 ug via INTRAVENOUS
  Filled 2022-08-07 (×7): qty 1

## 2022-08-07 MED ORDER — ACETAMINOPHEN 325 MG PO TABS
650.0000 mg | ORAL_TABLET | Freq: Four times a day (QID) | ORAL | Status: DC | PRN
  Administered 2022-08-10: 650 mg via ORAL

## 2022-08-07 NOTE — ED Notes (Signed)
Date and time results received: 08/07/22 2025 (use smartphrase ".now" to insert current time)  Test: Lactic Acid Critical Value: 3.5  Name of Provider Notified: Rubin Payor, MD  Orders Received? Or Actions Taken?:  notified

## 2022-08-07 NOTE — ED Triage Notes (Signed)
Pt to ED via EMS from home. Pt c/o severe chest pain radiating to back since 1pm. Pt also c/o N/V. EMS gave 324 ASA and 4 zofran en route and pt reports taking 1 nitro at home PTA. Pt also endorses mild SOB.   EMS Vitals: 20 LAC 70 HR

## 2022-08-07 NOTE — ED Notes (Signed)
Attempted to call lab to check on status of blood work with no answer x2.

## 2022-08-07 NOTE — ED Notes (Signed)
ED TO INPATIENT HANDOFF REPORT  ED Nurse Name and Phone #: Shinita Mac/ [phone]  S Name/Age/Gender Despina Arias 89 y.o. female Room/Bed: 010C/010C  Code Status   Code Status: DNR  Home/SNF/Other Home Patient oriented to: self, place, time, and situation Is this baseline? Yes   Triage Complete: Triage complete  Chief Complaint Acute biliary pancreatitis [K85.10]  Triage Note Pt to ED via EMS from home. Pt c/o severe chest pain radiating to back since 1pm. Pt also c/o N/V. EMS gave 324 ASA and 4 zofran en route and pt reports taking 1 nitro at home PTA. Pt also endorses mild SOB.   EMS Vitals: 20 LAC 70 HR    Allergies Allergies  Allergen Reactions   Cholestyramine     Possible- Makes throat burn. Patient said she can't take it    Compazine [Prochlorperazine Edisylate] Swelling    REACTION: " tongue swell and unable to swallow"   Celecoxib Diarrhea   Sulfonamide Derivatives Other (See Comments)    REACTION: " broke out with fine itching bumps"   Hydrocodone Other (See Comments)   Infliximab Other (See Comments)    Other reaction(s): rash   Leflunomide Other (See Comments)    Other reaction(s): diarrhea   Prochlorperazine Other (See Comments)    Other reaction(s): Unknown  Other Reaction(s): tongue swells   Rosuvastatin     Other reaction(s): muscle aches   Sulfa Antibiotics Other (See Comments)    Other reaction(s): tongue swelling    Level of Care/Admitting Diagnosis ED Disposition     ED Disposition  Admit   Condition  --   Comment  Hospital Area: [facility] [[id number]]  Level of Care: Progressive [102]  Admit to Progressive based on following criteria: MULTISYSTEM THREATS such as stable sepsis, metabolic/electrolyte imbalance with or without encephalopathy that is responding to early treatment.  May admit patient to [facility] or [name] if equivalent level of care is available:: Yes  Covid Evaluation: Asymptomatic - no recent exposure  (last 10 days) testing not required  Diagnosis: Acute biliary pancreatitis [454098]  Admitting Physician: Briscoe Deutscher [[id number]]  Attending Physician: Briscoe Deutscher [[id number]]  Certification:: I certify this patient will need inpatient services for at least 2 midnights  Estimated Length of Stay: 4          B Medical/Surgery History Past Medical History:  Diagnosis Date   Bruit    Carotid Doppler showed no significant abnormality     9per patient)   C. difficile colitis 07/2018   with severe sepsis   Chest pain, unspecified    Nuclear, May, 2008, no scar or ischemia   Diabetes mellitus    Diverticulosis    Dyslipidemia    Ejection fraction    EF 55-60%, echo, February, 2011 // Echocardiogram 8/21: EF 60-65, no RWMA, Gr 1 DD, GLS -14%, normal RVSF, mild LAE, trivial MR, mild MS (mean 4 mmHg), RVSP 23.4    GERD (gastroesophageal reflux disease)    Mitral regurgitation 04/21/2009   mild,  echo, February, 2011   Osteoporosis    Palpitations    possible very brief atrial fibrillation on monitor and possible reentrant tachycardia   Psoriasis    Rheumatoid arthritis (HCC)    Past Surgical History:  Procedure Laterality Date   ABDOMINAL AORTOGRAM W/LOWER EXTREMITY Right 10/19/2020   Procedure: ABDOMINAL AORTOGRAM W/LOWER EXTREMITY;  Surgeon: Iran Ouch, MD;  Location: [facility] INVASIVE CV LAB;  Service: Cardiovascular;  Laterality: Right;   FRACTURE  SURGERY     LOOP RECORDER REMOVAL N/A 01/05/2022   Procedure: LOOP RECORDER REMOVAL;  Surgeon: Marinus Maw, MD;  Location: [facility] INVASIVE CV LAB;  Service: Cardiovascular;  Laterality: N/A;   PACEMAKER IMPLANT N/A 01/05/2022   Procedure: PACEMAKER IMPLANT;  Surgeon: Marinus Maw, MD;  Location: [facility] INVASIVE CV LAB;  Service: Cardiovascular;  Laterality: N/A;   PPM GENERATOR REMOVAL N/A 01/29/2022   Procedure: PPM GENERATOR REMOVAL;  Surgeon: Marinus Maw, MD;  Location: [facility] INVASIVE CV LAB;  Service: Cardiovascular;   Laterality: N/A;   TEE WITHOUT CARDIOVERSION N/A 01/29/2022   Procedure: TRANSESOPHAGEAL ECHOCARDIOGRAM (TEE);  Surgeon: Sande Rives, MD;  Location[facility] ENDOSCOPY;  Service: Cardiovascular;  Laterality: N/A;   TRANSMETATARSAL AMPUTATION Right 01/18/2021   Procedure: TRANSMETATARSAL AMPUTATION;  Surgeon: Toni Arthurs, MD;  Location: [facility] OR;  Service: Orthopedics;  Laterality: Right;     A IV Location/Drains/Wounds Patient Lines/Drains/Airways Status     Active Line/Drains/Airways     Name Placement date Placement time Site Days   Peripheral IV 08/07/22 20 G Left Antecubital 08/07/22  1722  Antecubital  less than 1   Peripheral IV 08/07/22 20 G Anterior;Right Forearm 08/07/22  1924  Forearm  less than 1   PICC Single Lumen 01/31/22 Right Basilic 37 cm 0 cm 01/31/22  4540  Basilic  188   Wound / Incision (Open or Dehisced) 01/29/22 Other (Comment) Chest Left;Upper 01/29/22  1149  Chest  190            Intake/Output Last 24 hours No intake or output data in the 24 hours ending 08/07/22 2343  Labs/Imaging Results for orders placed or performed during the hospital encounter of 08/07/22 (from the past 48 hour(s))  Comprehensive metabolic panel     Status: Abnormal   Collection Time: 08/07/22  5:10 PM  Result Value Ref Range   Sodium 140 135 - 145 mmol/L   Potassium 4.2 3.5 - 5.1 mmol/L   Chloride 107 98 - 111 mmol/L   CO2 21 (L) 22 - 32 mmol/L   Glucose, Bld 228 (H) 70 - 99 mg/dL    Comment: Glucose reference range applies only to samples taken after fasting for at least 8 hours.   BUN 63 (H) 8 - 23 mg/dL   Creatinine, Ser 9.81 (H) 0.44 - 1.00 mg/dL   Calcium 8.5 (L) 8.9 - 10.3 mg/dL   Total Protein 5.8 (L) 6.5 - 8.1 g/dL   Albumin 3.1 (L) 3.5 - 5.0 g/dL   AST 191 (H) 15 - 41 U/L   ALT 149 (H) 0 - 44 U/L   Alkaline Phosphatase 59 38 - 126 U/L   Total Bilirubin 1.2 0.3 - 1.2 mg/dL   GFR, Estimated 35 (L) >60 mL/min    Comment: (NOTE) Calculated using the CKD-EPI  Creatinine Equation (2021)    Anion gap 12 5 - 15    Comment: Performed at [facility], [address], [place], [place] [address]  Lipase, blood     Status: Abnormal   Collection Time: 08/07/22  5:10 PM  Result Value Ref Range   Lipase 8,872 (H) 11 - 51 U/L    Comment: RESULTS CONFIRMED BY MANUAL DILUTION Performed at [facility], [address], [place], [place] [address]   CBC with Differential     Status: Abnormal   Collection Time: 08/07/22  5:10 PM  Result Value Ref Range   WBC 9.6 4.0 - 10.5 K/uL  RBC 3.25 (L) 3.87 - 5.11 MIL/uL   Hemoglobin 9.2 (L) 12.0 - 15.0 g/dL   HCT 16.1 (L) 09.6 - 04.5 %   MCV 92.3 80.0 - 100.0 fL   MCH 28.3 26.0 - 34.0 pg   MCHC 30.7 30.0 - 36.0 g/dL   RDW 40.9 (H) 81.1 - 91.4 %   Platelets 194 150 - 400 K/uL   nRBC 0.0 0.0 - 0.2 %   Neutrophils Relative % 91 %   Neutro Abs 8.7 (H) 1.7 - 7.7 K/uL   Lymphocytes Relative 7 %   Lymphs Abs 0.6 (L) 0.7 - 4.0 K/uL   Monocytes Relative 2 %   Monocytes Absolute 0.2 0.1 - 1.0 K/uL   Eosinophils Relative 0 %   Eosinophils Absolute 0.0 0.0 - 0.5 K/uL   Basophils Relative 0 %   Basophils Absolute 0.0 0.0 - 0.1 K/uL   Immature Granulocytes 0 %   Abs Immature Granulocytes 0.04 0.00 - 0.07 K/uL    Comment: Performed at [facility], [address] [place], [place] [address]  Troponin I (High Sensitivity)     Status: None   Collection Time: 08/07/22  5:10 PM  Result Value Ref Range   Troponin I (High Sensitivity) 7 <18 ng/L    Comment: (NOTE) Elevated high sensitivity troponin I (hsTnI) values and significant  changes across serial measurements may suggest ACS but many other  chronic and acute conditions are known to elevate hsTnI results.  Refer to the Links section for chest pain algorithms and additional  guidance. Performed at [facility], [address] [place], [place] [address]   Troponin I (High Sensitivity)     Status: None   Collection Time: 08/07/22  6:48 PM   Result Value Ref Range   Troponin I (High Sensitivity) 11 <18 ng/L    Comment: (NOTE) Elevated high sensitivity troponin I (hsTnI) values and significant  changes across serial measurements may suggest ACS but many other  chronic and acute conditions are known to elevate hsTnI results.  Refer to the "Links" section for chest pain algorithms and additional  guidance. Performed at [facility], [address], [place], [place] [address]   Lactic acid, plasma     Status: Abnormal   Collection Time: 08/07/22  7:15 PM  Result Value Ref Range   Lactic Acid, Venous 3.5 (HH) 0.5 - 1.9 mmol/L    Comment: CRITICAL RESULT CALLED TO, READ BACK BY AND VERIFIED WITH Volney American RN @ 2025 08/07/22 JBUTLER Performed at [facility], [address] [place], [place] [address]   Lactic acid, plasma     Status: Abnormal   Collection Time: 08/07/22  9:29 PM  Result Value Ref Range   Lactic Acid, Venous 3.4 (HH) 0.5 - 1.9 mmol/L    Comment: CRITICAL VALUE NOTED. VALUE IS CONSISTENT WITH PREVIOUSLY REPORTED/CALLED VALUE Performed at [facility], [address] [place], [place] [address]   CBG monitoring, ED     Status: Abnormal   Collection Time: 08/07/22 11:18 PM  Result Value Ref Range   Glucose-Capillary 177 (H) 70 - 99 mg/dL    Comment: Glucose reference range applies only to samples taken after fasting for at least 8 hours.   US Abdomen Limited RUQ (LIVER/GB)  Result Date: 08/07/2022 CLINICAL DATA:  Right upper quadrant pain. EXAM: ULTRASOUND ABDOMEN LIMITED RIGHT UPPER QUADRANT COMPARISON:  Aug 02, 2018 FINDINGS: Gallbladder: Small echogenic gallstones and/or gallbladder sludge is  suspected within the region of the gallbladder fundus. There is no evidence of gallbladder wall thickening (2.5 mm) no sonographic Murphy sign noted by sonographer. Common bile duct: Diameter: 7.1 mm Liver: No focal lesion identified. Within normal limits in parenchymal echogenicity. Portal vein is patent  on color Doppler imaging with normal direction of blood flow towards the liver. Other: A 2.6 cm x 1.8 cm x 2.1 cm simple cyst is seen within the upper pole of the right kidney. IMPRESSION: 1. Cholelithiasis and/or gallbladder sludge, without evidence of acute cholecystitis. 2. Simple right renal cyst. Electronically Signed   By: Aram Candela M.D.   On: 08/07/2022 21:17   CT ABDOMEN PELVIS W CONTRAST  Result Date: 08/07/2022 CLINICAL DATA:  Epigastric pain EXAM: CT ABDOMEN AND PELVIS WITH CONTRAST TECHNIQUE: Multidetector CT imaging of the abdomen and pelvis was performed using the standard protocol following bolus administration of intravenous contrast. RADIATION DOSE REDUCTION: This exam was performed according to the departmental dose-optimization program which includes automated exposure control, adjustment of the mA and/or kV according to patient size and/or use of iterative reconstruction technique. CONTRAST:  50mL OMNIPAQUE IOHEXOL 350 MG/ML SOLN COMPARISON:  01/24/2022 FINDINGS: Lower chest: Lung bases show mild left basilar atelectasis. No focal infiltrate or effusion is seen. Hepatobiliary: Liver is well visualized and within normal limits. Gallbladder is well distended with some dependent density consistent with small gallstones. Wall thickening and likely wall edema is noted. No obstructive changes are seen. Pancreas: Unremarkable. No pancreatic ductal dilatation or surrounding inflammatory changes. Spleen: Normal in size without focal abnormality. Adrenals/Urinary Tract: Adrenal glands are within normal limits. Kidneys are well visualized bilaterally within normal enhancement pattern. No renal calculi are noted. Simple appearing right renal cyst is noted measuring 2.8 cm. This is similar to that seen on the prior exam. No further follow-up is recommended. Smaller left renal cyst is noted also stable from the prior study. No obstructive changes are seen. The bladder is well distended.  Stomach/Bowel: Scattered diverticular change of the colon is noted without evidence of diverticulitis. The appendix is within normal limits. No obstructive changes of the colon are seen. Small bowel and stomach are within normal limits. Vascular/Lymphatic: Aortic atherosclerosis. No enlarged abdominal or pelvic lymph nodes. Reproductive: Uterus and bilateral adnexa are unremarkable. Other: No abdominal wall hernia or abnormality. No abdominopelvic ascites. Musculoskeletal: Mild degenerative changes of lumbar spine are noted with anterolisthesis of L4 on L5. IMPRESSION: Cholelithiasis with evidence of gallbladder wall thickening and likely wall edema. Right upper quadrant ultrasound may be helpful for further evaluation as this may represent acute cholecystitis. Diverticulosis without diverticulitis. Simple renal cysts bilaterally stable from prior exam. No further follow-up is recommended. Electronically Signed   By: Alcide Clever M.D.   On: 08/07/2022 20:33   DG Chest Portable 1 View  Result Date: 08/07/2022 CLINICAL DATA:  Epigastric pain EXAM: PORTABLE CHEST 1 VIEW COMPARISON:  Chest x-ray 01/29/2022 FINDINGS: The heart size and mediastinal contours are within normal limits. Both lungs are clear. There is blunting of the left costophrenic angle. There is no pneumothorax. The visualized skeletal structures are unremarkable. IMPRESSION: Blunting of the left costophrenic angle may be due to pleural thickening or small pleural effusion. Electronically Signed   By: Darliss Cheney M.D.   On: 08/07/2022 18:01    Pending Labs Unresulted Labs (From admission, onward)     Start     Ordered   08/08/22 0500  Hemoglobin A1c  Tomorrow morning,   R  Comments: To assess prior glycemic control    08/07/22 2213   08/08/22 0500  Comprehensive metabolic panel  Daily,   R      08/07/22 2213   08/08/22 0500  Magnesium  Tomorrow morning,   R        08/07/22 2213   08/08/22 0500  Phosphorus  Tomorrow morning,   R         08/07/22 2213   08/08/22 0500  CBC  Daily,   R      08/07/22 2213   08/08/22 0500  Lipase, blood  Tomorrow morning,   R        08/07/22 2213   08/07/22 1900  Culture, blood (routine x 2)  BLOOD CULTURE X 2,   R      08/07/22 1859            Vitals/Pain Today's Vitals   08/07/22 2000 08/07/22 2030 08/07/22 2033 08/07/22 2100  BP: 125/81 (!) 137/43  (!) 137/47  Pulse: 97 100  100  Resp: 20 (!) 22  19  Temp:   (!) 102 F (38.9 C)   TempSrc:   Oral   SpO2: 99% 100%  100%  PainSc:        Isolation Precautions No active isolations  Medications Medications  amiodarone (PACERONE) tablet 100 mg (has no administration in time range)  atorvastatin (LIPITOR) tablet 20 mg (has no administration in time range)  mirtazapine (REMERON) tablet 7.5 mg (7.5 mg Oral Given 08/07/22 2332)  gabapentin (NEURONTIN) capsule 200 mg (200 mg Oral Given 08/07/22 2331)  methocarbamol (ROBAXIN) tablet 500 mg (has no administration in time range)  cycloSPORINE (RESTASIS) 0.05 % ophthalmic emulsion 1 drop (has no administration in time range)  diclofenac Sodium (VOLTAREN) 1 % topical gel 4 g (has no administration in time range)  predniSONE (DELTASONE) tablet 5 mg (has no administration in time range)  insulin aspart (novoLOG) injection 0-6 Units (1 Units Subcutaneous Given 08/07/22 2334)  heparin injection 5,000 Units (5,000 Units Subcutaneous Given 08/07/22 2335)  sodium chloride flush (NS) 0.9 % injection 3 mL (3 mLs Intravenous Not Given 08/07/22 2337)  acetaminophen (TYLENOL) tablet 650 mg (has no administration in time range)    Or  acetaminophen (TYLENOL) suppository 650 mg (has no administration in time range)  fentaNYL (SUBLIMAZE) injection 12.5-50 mcg (has no administration in time range)  ondansetron (ZOFRAN) tablet 4 mg (has no administration in time range)    Or  ondansetron (ZOFRAN) injection 4 mg (has no administration in time range)  lactated ringers infusion ( Intravenous New Bag/Given  08/07/22 2340)  piperacillin-tazobactam (ZOSYN) IVPB 3.375 g (has no administration in time range)  fentaNYL (SUBLIMAZE) injection 25 mcg (25 mcg Intravenous Given 08/07/22 1724)  ondansetron (ZOFRAN) injection 4 mg (4 mg Intravenous Given 08/07/22 1724)  lactated ringers bolus 1,000 mL (0 mLs Intravenous Stopped 08/07/22 2125)  lactated ringers bolus 1,000 mL (0 mLs Intravenous Stopped 08/07/22 2125)  iohexol (OMNIPAQUE) 350 MG/ML injection 50 mL (50 mLs Intravenous Contrast Given 08/07/22 2020)  piperacillin-tazobactam (ZOSYN) IVPB 3.375 g (0 g Intravenous Stopped 08/07/22 2125)    Mobility walks with device     Focused Assessments     R Recommendations: See Admitting Provider Note  Report given to:   Additional Notes: Pt came in CP. Her temp started rising and they ended up doing a septic work-up on her and getting cultures and a lactic. Both are elevated at 3.5 and 3.4. She has a 20 G  in the L [facility] and a 20 G in the R FA. She got Zosyn, 1 L of LR and has LR going at 75.

## 2022-08-07 NOTE — Consult Note (Signed)
Reason for Consult:abd pain Referring Physician: Dr. Anselmo Rod Shelby Walker is an 89 y.o. female.  HPI: Patient is an 89 year old female who comes in secondary to abdominal pain.  States the pain began this morning.  She states that she had some epigastric abdominal pain that she felt was related to her heart.  Patient denies any nausea or vomiting or diarrhea as well as no fevers at home.  Patient underwent workup per EDP.  Patient was found to have elevated lipase, and multiple small gallstones per CT scan.  Ultrasound currently pending.  Patient with a T. bili 1.2, elevated AST and ALT of 310 and 49 respectively.  Patient with a normal WBC count.  Patient does see Dr. Elease Hashimoto who recently saw him on 520.  Patient follows with him for A-fib and carotid stenosis.  Patient did have previous pacemaker but was removed secondary to infection.  Past Medical History:  Diagnosis Date   Bruit    Carotid Doppler showed no significant abnormality     9per patient)   C. difficile colitis 07/2018   with severe sepsis   Chest pain, unspecified    Nuclear, May, 2008, no scar or ischemia   Diabetes mellitus    Diverticulosis    Dyslipidemia    Ejection fraction    EF 55-60%, echo, February, 2011 // Echocardiogram 8/21: EF 60-65, no RWMA, Gr 1 DD, GLS -14%, normal RVSF, mild LAE, trivial MR, mild MS (mean 4 mmHg), RVSP 23.4    GERD (gastroesophageal reflux disease)    Mitral regurgitation 04/21/2009   mild,  echo, February, 2011   Osteoporosis    Palpitations    possible very brief atrial fibrillation on monitor and possible reentrant tachycardia   Psoriasis    Rheumatoid arthritis (HCC)     Past Surgical History:  Procedure Laterality Date   ABDOMINAL AORTOGRAM W/LOWER EXTREMITY Right 10/19/2020   Procedure: ABDOMINAL AORTOGRAM W/LOWER EXTREMITY;  Surgeon: Iran Ouch, MD;  Location: [facility];  Service: Cardiovascular;  Laterality: Right;   FRACTURE SURGERY     LOOP RECORDER  REMOVAL N/A 01/05/2022   Procedure: LOOP RECORDER REMOVAL;  Surgeon: Marinus Maw, MD;  Location: [facility];  Service: Cardiovascular;  Laterality: N/A;   PACEMAKER IMPLANT N/A 01/05/2022   Procedure: PACEMAKER IMPLANT;  Surgeon: Marinus Maw, MD;  Location: [facility];  Service: Cardiovascular;  Laterality: N/A;   PPM GENERATOR REMOVAL N/A 01/29/2022   Procedure: PPM GENERATOR REMOVAL;  Surgeon: Marinus Maw, MD;  Location: [facility];  Service: Cardiovascular;  Laterality: N/A;   TEE WITHOUT CARDIOVERSION N/A 01/29/2022   Procedure: TRANSESOPHAGEAL ECHOCARDIOGRAM (TEE);  Surgeon: Sande Rives, MD;  Location: [facility] ENDOSCOPY;  Service: Cardiovascular;  Laterality: N/A;   TRANSMETATARSAL AMPUTATION Right 01/18/2021   Procedure: TRANSMETATARSAL AMPUTATION;  Surgeon: Toni Arthurs, MD;  Location: [facility] OR;  Service: Orthopedics;  Laterality: Right;    Family History  Problem Relation Age of Onset   Heart failure Father    Heart attack Father    Diabetes Father    Heart attack Mother    Heart failure Mother    Diabetes Mother    Stroke Sister     Social History:  reports that she has never smoked. She has never used smokeless tobacco. She reports that she does not drink alcohol and does not use drugs.  Allergies:  Allergies  Allergen Reactions   Cholestyramine     Possible- Makes throat  burn. Patient said she can't take it    Compazine [Prochlorperazine Edisylate] Swelling    REACTION: " tongue swell and unable to swallow"   Celecoxib Diarrhea   Sulfonamide Derivatives Other (See Comments)    REACTION: " broke out with fine itching bumps"   Hydrocodone Other (See Comments)   Infliximab Other (See Comments)    Other reaction(s): rash   Leflunomide Other (See Comments)    Other reaction(s): diarrhea   Prochlorperazine Other (See Comments)    Other reaction(s): Unknown  Other Reaction(s): tongue swells   Rosuvastatin     Other reaction(s):  muscle aches   Sulfa Antibiotics Other (See Comments)    Other reaction(s): tongue swelling    Medications: I have reviewed the patient's current medications.  Results for orders placed or performed during the hospital encounter of 08/07/22 (from the past 48 hour(s))  Comprehensive metabolic panel     Status: Abnormal   Collection Time: 08/07/22  5:10 PM  Result Value Ref Range   Sodium 140 135 - 145 mmol/L   Potassium 4.2 3.5 - 5.1 mmol/L   Chloride 107 98 - 111 mmol/L   CO2 21 (L) 22 - 32 mmol/L   Glucose, Bld 228 (H) 70 - 99 mg/dL    Comment: Glucose reference range applies only to samples taken after fasting for at least 8 hours.   BUN 63 (H) 8 - 23 mg/dL   Creatinine, Ser 1.61 (H) 0.44 - 1.00 mg/dL   Calcium 8.5 (L) 8.9 - 10.3 mg/dL   Total Protein 5.8 (L) 6.5 - 8.1 g/dL   Albumin 3.1 (L) 3.5 - 5.0 g/dL   AST 096 (H) 15 - 41 U/L   ALT 149 (H) 0 - 44 U/L   Alkaline Phosphatase 59 38 - 126 U/L   Total Bilirubin 1.2 0.3 - 1.2 mg/dL   GFR, Estimated 35 (L) >60 mL/min    Comment: (NOTE) Calculated using the CKD-EPI Creatinine Equation (2021)    Anion gap 12 5 - 15    Comment: Performed at [facility], [address] [place], [place] [address]  Lipase, blood     Status: Abnormal   Collection Time: 08/07/22  5:10 PM  Result Value Ref Range   Lipase 8,872 (H) 11 - 51 U/L    Comment: RESULTS CONFIRMED BY MANUAL DILUTION Performed at [facility], [address], [place], [place] [address]   CBC with Differential     Status: Abnormal   Collection Time: 08/07/22  5:10 PM  Result Value Ref Range   WBC 9.6 4.0 - 10.5 K/uL   RBC 3.25 (L) 3.87 - 5.11 MIL/uL   Hemoglobin 9.2 (L) 12.0 - 15.0 g/dL   HCT 14.7 (L) 82.9 - 56.2 %   MCV 92.3 80.0 - 100.0 fL   MCH 28.3 26.0 - 34.0 pg   MCHC 30.7 30.0 - 36.0 g/dL   RDW 13.0 (H) 86.5 - 78.4 %   Platelets 194 150 - 400 K/uL   nRBC 0.0 0.0 - 0.2 %   Neutrophils Relative % 91 %   Neutro Abs 8.7 (H) 1.7 - 7.7 K/uL    Lymphocytes Relative 7 %   Lymphs Abs 0.6 (L) 0.7 - 4.0 K/uL   Monocytes Relative 2 %   Monocytes Absolute 0.2 0.1 - 1.0 K/uL   Eosinophils Relative 0 %   Eosinophils Absolute 0.0 0.0 - 0.5 K/uL   Basophils Relative 0 %   Basophils Absolute 0.0 0.0 -  0.1 K/uL   Immature Granulocytes 0 %   Abs Immature Granulocytes 0.04 0.00 - 0.07 K/uL    Comment: Performed at [facility], [address] [place], [place] [address]  Troponin I (High Sensitivity)     Status: None   Collection Time: 08/07/22  5:10 PM  Result Value Ref Range   Troponin I (High Sensitivity) 7 <18 ng/L    Comment: (NOTE) Elevated high sensitivity troponin I (hsTnI) values and significant  changes across serial measurements may suggest ACS but many other  chronic and acute conditions are known to elevate hsTnI results.  Refer to the Links section for chest pain algorithms and additional  guidance. Performed at [facility], [address] [place], [place] [address]   Troponin I (High Sensitivity)     Status: None   Collection Time: 08/07/22  6:48 PM  Result Value Ref Range   Troponin I (High Sensitivity) 11 <18 ng/L    Comment: (NOTE) Elevated high sensitivity troponin I (hsTnI) values and significant  changes across serial measurements may suggest ACS but many other  chronic and acute conditions are known to elevate hsTnI results.  Refer to the "Links" section for chest pain algorithms and additional  guidance. Performed at [facility], [address], [place], [place] [address]   Lactic acid, plasma     Status: Abnormal   Collection Time: 08/07/22  7:15 PM  Result Value Ref Range   Lactic Acid, Venous 3.5 (HH) 0.5 - 1.9 mmol/L    Comment: CRITICAL RESULT CALLED TO, READ BACK BY AND VERIFIED WITH Volney American RN @ 2025 08/07/22 JBUTLER Performed at [facility], [address], [place], [place] [address]     US Abdomen Limited RUQ (LIVER/GB)  Result Date: 08/07/2022 CLINICAL DATA:   Right upper quadrant pain. EXAM: ULTRASOUND ABDOMEN LIMITED RIGHT UPPER QUADRANT COMPARISON:  Aug 02, 2018 FINDINGS: Gallbladder: Small echogenic gallstones and/or gallbladder sludge is suspected within the region of the gallbladder fundus. There is no evidence of gallbladder wall thickening (2.5 mm) no sonographic Murphy sign noted by sonographer. Common bile duct: Diameter: 7.1 mm Liver: No focal lesion identified. Within normal limits in parenchymal echogenicity. Portal vein is patent on color Doppler imaging with normal direction of blood flow towards the liver. Other: A 2.6 cm x 1.8 cm x 2.1 cm simple cyst is seen within the upper pole of the right kidney. IMPRESSION: 1. Cholelithiasis and/or gallbladder sludge, without evidence of acute cholecystitis. 2. Simple right renal cyst. Electronically Signed   By: Aram Candela M.D.   On: 08/07/2022 21:17   CT ABDOMEN PELVIS W CONTRAST  Result Date: 08/07/2022 CLINICAL DATA:  Epigastric pain EXAM: CT ABDOMEN AND PELVIS WITH CONTRAST TECHNIQUE: Multidetector CT imaging of the abdomen and pelvis was performed using the standard protocol following bolus administration of intravenous contrast. RADIATION DOSE REDUCTION: This exam was performed according to the departmental dose-optimization program which includes automated exposure control, adjustment of the mA and/or kV according to patient size and/or use of iterative reconstruction technique. CONTRAST:  50mL OMNIPAQUE IOHEXOL 350 MG/ML SOLN COMPARISON:  01/24/2022 FINDINGS: Lower chest: Lung bases show mild left basilar atelectasis. No focal infiltrate or effusion is seen. Hepatobiliary: Liver is well visualized and within normal limits. Gallbladder is well distended with some dependent density consistent with small gallstones. Wall thickening and likely wall edema is noted. No obstructive changes are seen. Pancreas: Unremarkable. No pancreatic ductal dilatation or surrounding inflammatory changes.  Spleen: Normal  in size without focal abnormality. Adrenals/Urinary Tract: Adrenal glands are within normal limits. Kidneys are well visualized bilaterally within normal enhancement pattern. No renal calculi are noted. Simple appearing right renal cyst is noted measuring 2.8 cm. This is similar to that seen on the prior exam. No further follow-up is recommended. Smaller left renal cyst is noted also stable from the prior study. No obstructive changes are seen. The bladder is well distended. Stomach/Bowel: Scattered diverticular change of the colon is noted without evidence of diverticulitis. The appendix is within normal limits. No obstructive changes of the colon are seen. Small bowel and stomach are within normal limits. Vascular/Lymphatic: Aortic atherosclerosis. No enlarged abdominal or pelvic lymph nodes. Reproductive: Uterus and bilateral adnexa are unremarkable. Other: No abdominal wall hernia or abnormality. No abdominopelvic ascites. Musculoskeletal: Mild degenerative changes of lumbar spine are noted with anterolisthesis of L4 on L5. IMPRESSION: Cholelithiasis with evidence of gallbladder wall thickening and likely wall edema. Right upper quadrant ultrasound may be helpful for further evaluation as this may represent acute cholecystitis. Diverticulosis without diverticulitis. Simple renal cysts bilaterally stable from prior exam. No further follow-up is recommended. Electronically Signed   By: Alcide Clever M.D.   On: 08/07/2022 20:33   DG Chest Portable 1 View  Result Date: 08/07/2022 CLINICAL DATA:  Epigastric pain EXAM: PORTABLE CHEST 1 VIEW COMPARISON:  Chest x-ray 01/29/2022 FINDINGS: The heart size and mediastinal contours are within normal limits. Both lungs are clear. There is blunting of the left costophrenic angle. There is no pneumothorax. The visualized skeletal structures are unremarkable. IMPRESSION: Blunting of the left costophrenic angle may be due to pleural thickening or small pleural effusion.  Electronically Signed   By: Darliss Cheney M.D.   On: 08/07/2022 18:01    Review of Systems  Constitutional:  Negative for chills and fever.  HENT:  Negative for ear discharge, hearing loss and sore throat.   Eyes:  Negative for discharge.  Respiratory:  Negative for cough and shortness of breath.   Cardiovascular:  Negative for chest pain and leg swelling.  Gastrointestinal:  Positive for abdominal pain. Negative for constipation, diarrhea, nausea and vomiting.  Musculoskeletal:  Negative for myalgias and neck pain.  Skin:  Negative for rash.  Allergic/Immunologic: Negative for environmental allergies.  Neurological:  Negative for dizziness and seizures.  Hematological:  Does not bruise/bleed easily.  Psychiatric/Behavioral:  Negative for suicidal ideas.   All other systems reviewed and are negative.  Blood pressure (!) 137/47, pulse 100, temperature (!) 102 F (38.9 C), temperature source Oral, resp. rate 19, SpO2 100 %. Physical Exam Constitutional:      Appearance: She is well-developed.     Comments: Conversant No acute distress  HENT:     Head: Normocephalic and atraumatic.  Eyes:     General: Lids are normal. No scleral icterus.    Pupils: Pupils are equal, round, and reactive to light.     Comments: Pupils are equal round and reactive No lid lag Moist conjunctiva  Neck:     Thyroid: No thyromegaly.     Trachea: No tracheal tenderness.     Comments: No cervical lymphadenopathy Cardiovascular:     Rate and Rhythm: Normal rate and regular rhythm.     Heart sounds: No murmur heard. Pulmonary:     Effort: Pulmonary effort is normal.     Breath sounds: Normal breath sounds. No wheezing or rales.  Abdominal:     Tenderness: There is generalized abdominal tenderness.  Hernia: No hernia is present.  Musculoskeletal:     Cervical back: Normal range of motion and neck supple.  Skin:    General: Skin is warm.     Findings: No rash.     Nails: There is no clubbing.      Comments: Normal skin turgor  Neurological:     Mental Status: She is alert and oriented to person, place, and time.     Comments: Normal gait and station  Psychiatric:        Mood and Affect: Mood normal.        Thought Content: Thought content normal.        Judgment: Judgment normal.     Comments: Appropriate affect     Assessment/Plan: 89 year old female with gallstone pancreatitis. History of A-fib Carotid stenosis Mitral regurg  1.  Recommend admission to medicine team. 2.  Recommend n.p.o., IV fluids to help with rehydration. 3.  Will follow along 4.  Patient would likely benefit from cardiac evaluation.  Axel Filler 08/07/2022, 9:19 PM

## 2022-08-07 NOTE — Progress Notes (Signed)
Pharmacy Antibiotic Note  Shelby Walker is a 89 y.o. female admitted on 08/07/2022 with  IAI, CT Ab showing acute cholecystitis .  Pharmacy has been consulted for Zosyn dosing. Calculated CrCL 40mL/min using historical weight.   Plan: Zosyn 3.375g IV q8h (4 hour infusion). Follow culture data for de-escalation.  Monitor renal function for dose adjustments as indicated.     Temp (24hrs), Avg:100.2 F (37.9 C), Min:97.8 F (36.6 C), Max:102 F (38.9 C)  Recent Labs  Lab 08/07/22 1710 08/07/22 1915  WBC 9.6  --   CREATININE 1.45*  --   LATICACIDVEN  --  3.5*    CrCl cannot be calculated (Unknown ideal weight.).    Allergies  Allergen Reactions   Cholestyramine     Possible- Makes throat burn. Patient said she can't take it    Compazine [Prochlorperazine Edisylate] Swelling    REACTION: " tongue swell and unable to swallow"   Celecoxib Diarrhea   Sulfonamide Derivatives Other (See Comments)    REACTION: " broke out with fine itching bumps"   Hydrocodone Other (See Comments)   Infliximab Other (See Comments)    Other reaction(s): rash   Leflunomide Other (See Comments)    Other reaction(s): diarrhea   Prochlorperazine Other (See Comments)    Other reaction(s): Unknown  Other Reaction(s): tongue swells   Rosuvastatin     Other reaction(s): muscle aches   Sulfa Antibiotics Other (See Comments)    Other reaction(s): tongue swelling   Thank you for allowing pharmacy to be a part of this patient's care.  Estill Batten, PharmD, BCCCP  08/07/2022 10:29 PM

## 2022-08-07 NOTE — H&P (Addendum)
History and Physical    Shelby Walker ZOX:[id number] DOB: 1933-05-23 DOA: 08/07/2022  PCP: Adrian Prince, MD   Patient coming from: Home   Chief Complaint: Epigastric pain, N/V, shaking chills   HPI: Shelby Walker is a pleasant 89 y.o. 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  Patient was in her usual state this morning and was playing a card game with friends when she developed acute pain in the upper abdomen shortly after noon.  Pain was severe, radiating through to her back, and associated with nausea.  She was initially concerned that this was angina and took a nitroglycerin.  EMS was called, administered 324 mg of aspirin, and also gave the patient multiple doses of Zofran for recurrent vomiting episodes prior to arrival in the ED.  Family at the bedside notes that the patient had severe chills with shaking.  They also note that the patient had a similar pain episode approximately 2 weeks ago that was self-limited.  ED Course: Upon arrival to the ED, patient is found to be febrile to 38.9 C and saturating well on room air with slightly elevated heart rate and systolic blood pressure of 94 and greater.  EKG demonstrates sinus rhythm and chest x-ray notable for blunted left costophrenic angle.  CT of the abdomen and pelvis is notable for cholelithiasis with distended gallbladder, gallbladder wall thickening, and likely edema.  Right upper quadrant ultrasound reveals cholelithiasis and/or sludge, but no evidence for acute cholecystitis or biliary obstruction.  Labs are most notable for lactic acid 3.5, AST 310, ALT 149, normal bilirubin, and normal WBC.  Surgery was consulted  by the ED physician, blood cultures were collected, and the patient was given 2 L of LR, fentanyl, Zofran, and Zosyn in the ED.  Review of Systems:  All other systems reviewed and apart from HPI, are negative.  Past Medical History:  Diagnosis Date   Bruit    Carotid Doppler showed no significant abnormality     9per patient)   C. difficile colitis 07/2018   with severe sepsis   Chest pain, unspecified    Nuclear, May, 2008, no scar or ischemia   Diabetes mellitus    Diverticulosis    Dyslipidemia    Ejection fraction    EF 55-60%, echo, February, 2011 // Echocardiogram 8/21: EF 60-65, no RWMA, Gr 1 DD, GLS -14%, normal RVSF, mild LAE, trivial MR, mild MS (mean 4 mmHg), RVSP 23.4    GERD (gastroesophageal reflux disease)    Mitral regurgitation 04/21/2009   mild,  echo, February, 2011   Osteoporosis    Palpitations    possible very brief atrial fibrillation on monitor and possible reentrant tachycardia   Psoriasis    Rheumatoid arthritis (HCC)     Past Surgical History:  Procedure Laterality Date   ABDOMINAL AORTOGRAM W/LOWER EXTREMITY Right 10/19/2020   Procedure: ABDOMINAL AORTOGRAM W/LOWER EXTREMITY;  Surgeon: Iran Ouch, MD;  Location: [facility] INVASIVE CV LAB;  Service: Cardiovascular;  Laterality: Right;   FRACTURE SURGERY     LOOP RECORDER REMOVAL N/A 01/05/2022   Procedure: LOOP RECORDER REMOVAL;  Surgeon: Marinus Maw, MD;  Location: [facility] INVASIVE CV [facility];  Service: Cardiovascular;  Laterality: N/A;   PACEMAKER IMPLANT N/A 01/05/2022  Procedure: PACEMAKER IMPLANT;  Surgeon: Marinus Maw, MD;  Location[facility] INVASIVE CV LAB;  Service: Cardiovascular;  Laterality: N/A;   PPM GENERATOR REMOVAL N/A 01/29/2022   Procedure: PPM GENERATOR REMOVAL;  Surgeon: Marinus Maw, MD;  Location: [facility] INVASIVE CV LAB;  Service: Cardiovascular;  Laterality: N/A;   TEE WITHOUT CARDIOVERSION N/A 01/29/2022   Procedure: TRANSESOPHAGEAL ECHOCARDIOGRAM (TEE);  Surgeon: Sande Rives, MD;  Location: [facility] ENDOSCOPY;  Service: Cardiovascular;  Laterality: N/A;   TRANSMETATARSAL AMPUTATION Right 01/18/2021   Procedure: TRANSMETATARSAL AMPUTATION;  Surgeon: Toni Arthurs, MD;  Location: [facility] OR;  Service: Orthopedics;  Laterality: Right;    Social History:   reports that she has never smoked. She has never used smokeless tobacco. She reports that she does not drink alcohol and does not use drugs.  Allergies  Allergen Reactions   Cholestyramine     Possible- Makes throat burn. Patient said she can't take it    Compazine [Prochlorperazine Edisylate] Swelling    REACTION: " tongue swell and unable to swallow"   Celecoxib Diarrhea   Sulfonamide Derivatives Other (See Comments)    REACTION: " broke out with fine itching bumps"   Hydrocodone Other (See Comments)   Infliximab Other (See Comments)    Other reaction(s): rash   Leflunomide Other (See Comments)    Other reaction(s): diarrhea   Prochlorperazine Other (See Comments)    Other reaction(s): Unknown  Other Reaction(s): tongue swells   Rosuvastatin     Other reaction(s): muscle aches   Sulfa Antibiotics Other (See Comments)    Other reaction(s): tongue swelling    Family History  Problem Relation Age of Onset   Heart failure Father    Heart attack Father    Diabetes Father    Heart attack Mother    Heart failure Mother    Diabetes Mother    Stroke Sister      Prior to Admission medications   Medication Sig Start Date End Date Taking? Authorizing Provider  acetaminophen (TYLENOL) 500 MG tablet Take 1,000 mg by mouth daily.   Yes [provider]  amiodarone (PACERONE) 200 MG tablet Take 0.5 tablets (100 mg total) by mouth daily. 04/02/22  Yes Duke Salvia, MD  atorvastatin (LIPITOR) 20 MG tablet Take 1 tablet (20 mg total) by mouth daily. 02/28/22  Yes Nahser, Deloris Ping, MD  bismuth subsalicylate (PEPTO BISMOL) 262 MG/15ML suspension Take 30 mLs by mouth daily at 6 (six) AM.   Yes [provider]  Calcium Carbonate-Vitamin D 600-400 MG-UNIT tablet Take 1 tablet by mouth daily at 12 noon.    Yes [provider]  diclofenac Sodium (VOLTAREN) 1 % GEL Apply 4 g topically 4 (four) times daily. 02/15/22  Yes Setzer, Lynnell Jude, PA-C  gabapentin (NEURONTIN) 100 MG capsule Take 1 capsule (100 mg total) by mouth 3 (three) times daily. Patient taking differently: Take 200 mg by mouth 3 (three) times daily. 02/06/21  Yes Love, Evlyn Kanner, PA-C  insulin aspart (NOVOLOG) 100 UNIT/ML FlexPen Inject 2 Units into the skin 3 (three) times daily with meals. Sliding scale 02/15/22  Yes Setzer, Lynnell Jude, PA-C  Insulin Degludec (TRESIBA) 100 UNIT/ML SOLN Inject 4 Units into the skin at bedtime. 02/15/22  Yes Setzer, Lynnell Jude, PA-C  methocarbamol (ROBAXIN) 500 MG tablet Take 500 mg by mouth 4 (four) times daily.   Yes [provider]  mirtazapine (REMERON) 7.5 MG tablet Take 7.5 mg by mouth at bedtime.   Yes [provider]  Multiple Vitamins-Minerals (CENTRUM SILVER 50+WOMEN PO) Take 1 tablet by mouth daily.   Yes [provider]  nitroGLYCERIN (NITROSTAT) 0.4 MG SL tablet Dissolve 1 tablet under the tongue every 5 minutes as needed for chest pain. Max of 3 doses, then 911. 07/23/22  Yes Nahser, Deloris Ping, MD  predniSONE (DELTASONE) 5 MG tablet Take 1 tablet (5 mg total) by mouth daily with breakfast. 02/15/22  Yes Setzer, Lynnell Jude, PA-C  protein supplement shake (PREMIER PROTEIN) LIQD Take 2 oz by mouth 2 (two) times daily.   Yes [provider]  pyrithione zinc (SELSUN BLUE DRY SCALP) 1 % shampoo Apply 1 application topically once a week. 04/15/15  Yes [provider]  RESTASIS 0.05 % ophthalmic emulsion Place 1 drop into both eyes 2 (two) times daily. 02/13/19  Yes [provider]    Physical Exam: Vitals:   08/07/22 2000 08/07/22 2030 08/07/22 2033 08/07/22 2100  BP: 125/81 (!) 137/43  (!) 137/47  Pulse: 97 100  100  Resp: 20 (!) 22   19  Temp:   (!) 102 F (38.9 C)   TempSrc:   Oral   SpO2: 99% 100%  100%    Constitutional: NAD, calm  Eyes: PERTLA, lids and conjunctivae normal ENMT: Mucous membranes are moist. Posterior pharynx clear of any exudate or lesions.   Neck: supple, no masses  Respiratory: no wheezing, no crackles. No accessory muscle use.  Cardiovascular: S1 & S2 heard, regular rate and rhythm. Mild lower extremity edema.   Abdomen: Soft, no distension, tender in upper abdomen. Bowel sounds active.  Musculoskeletal: no clubbing / cyanosis. S/p right TMA. Finger deformities with ulnar deviation.    Skin: no significant rashes, lesions, ulcers. Warm, dry, well-perfused. Neurologic: CN 2-12 grossly intact. Moving all extremities. Alert and oriented.  Psychiatric: Pleasant. Cooperative.    Labs and Imaging on Admission: I have personally reviewed following labs and imaging studies  CBC: Recent Labs  Lab 08/07/22 1710  WBC 9.6  NEUTROABS 8.7*  HGB 9.2*  HCT 30.0*  MCV 92.3  PLT 194   Basic Metabolic Panel: Recent Labs  Lab 08/07/22 1710  NA 140  K 4.2  CL 107  CO2 21*  GLUCOSE 228*  BUN 63*  CREATININE 1.45*  CALCIUM 8.5*   GFR: CrCl cannot be calculated (Unknown ideal weight.). Liver Function Tests: Recent Labs  Lab 08/07/22 1710  AST 310*  ALT 149*  ALKPHOS 59  BILITOT 1.2  PROT 5.8*  ALBUMIN 3.1*   Recent Labs  Lab 08/07/22 1710  LIPASE 8,872*   No results for input(s): "AMMONIA" in the last 168 hours. Coagulation Profile: No results for input(s): "INR", "PROTIME" in the last 168 hours. Cardiac Enzymes: No results for input(s): "CKTOTAL", "CKMB", "CKMBINDEX", "TROPONINI" in the last 168 hours. BNP (last 3 results) No results for input(s): "PROBNP" in the last 8760 hours. HbA1C: No results for input(s): "HGBA1C" in the last 72 hours. CBG: No results for input(s): "GLUCAP" in the last 168 hours. Lipid Profile: No results for input(s): "CHOL", "HDL", "LDLCALC",  "TRIG", "CHOLHDL", "LDLDIRECT" in the last 72 hours. Thyroid Function Tests: No results for input(s): "TSH", "T4TOTAL", "FREET4", "T3FREE", "THYROIDAB" in the last 72 hours. Anemia Panel: No results for input(s): "VITAMINB12", "FOLATE", "FERRITIN", "TIBC", "IRON", "RETICCTPCT" in the last 72 hours. Urine analysis:    Component Value Date/Time   COLORURINE YELLOW 02/04/2022 0730   APPEARANCEUR CLOUDY (A) 02/04/2022 0730   LABSPEC 1.008 02/04/2022 0730   PHURINE  7.0 02/04/2022 0730   GLUCOSEU 50 (A) 02/04/2022 0730   HGBUR SMALL (A) 02/04/2022 0730   BILIRUBINUR NEGATIVE 02/04/2022 0730   KETONESUR NEGATIVE 02/04/2022 0730   PROTEINUR 30 (A) 02/04/2022 0730   NITRITE NEGATIVE 02/04/2022 0730   LEUKOCYTESUR LARGE (A) 02/04/2022 0730   Sepsis Labs: @LABRCNTIP (procalcitonin:4,lacticidven:4) )No results found for this or any previous visit (from the past 240 hour(s)).   Radiological Exams on Admission: US Abdomen Limited RUQ (LIVER/GB)  Result Date: 08/07/2022 CLINICAL DATA:  Right upper quadrant pain. EXAM: ULTRASOUND ABDOMEN LIMITED RIGHT UPPER QUADRANT COMPARISON:  Aug 02, 2018 FINDINGS: Gallbladder: Small echogenic gallstones and/or gallbladder sludge is suspected within the region of the gallbladder fundus. There is no evidence of gallbladder wall thickening (2.5 mm) no sonographic Murphy sign noted by sonographer. Common bile duct: Diameter: 7.1 mm Liver: No focal lesion identified. Within normal limits in parenchymal echogenicity. Portal vein is patent on color Doppler imaging with normal direction of blood flow towards the liver. Other: A 2.6 cm x 1.8 cm x 2.1 cm simple cyst is seen within the upper pole of the right kidney. IMPRESSION: 1. Cholelithiasis and/or gallbladder sludge, without evidence of acute cholecystitis. 2. Simple right renal cyst. Electronically Signed   By: Aram Candela M.D.   On: 08/07/2022 21:17   CT ABDOMEN PELVIS W CONTRAST  Result Date: 08/07/2022 CLINICAL  DATA:  Epigastric pain EXAM: CT ABDOMEN AND PELVIS WITH CONTRAST TECHNIQUE: Multidetector CT imaging of the abdomen and pelvis was performed using the standard protocol following bolus administration of intravenous contrast. RADIATION DOSE REDUCTION: This exam was performed according to the departmental dose-optimization program which includes automated exposure control, adjustment of the mA and/or kV according to patient size and/or use of iterative reconstruction technique. CONTRAST:  50mL OMNIPAQUE IOHEXOL 350 MG/ML SOLN COMPARISON:  01/24/2022 FINDINGS: Lower chest: Lung bases show mild left basilar atelectasis. No focal infiltrate or effusion is seen. Hepatobiliary: Liver is well visualized and within normal limits. Gallbladder is well distended with some dependent density consistent with small gallstones. Wall thickening and likely wall edema is noted. No obstructive changes are seen. Pancreas: Unremarkable. No pancreatic ductal dilatation or surrounding inflammatory changes. Spleen: Normal in size without focal abnormality. Adrenals/Urinary Tract: Adrenal glands are within normal limits. Kidneys are well visualized bilaterally within normal enhancement pattern. No renal calculi are noted. Simple appearing right renal cyst is noted measuring 2.8 cm. This is similar to that seen on the prior exam. No further follow-up is recommended. Smaller left renal cyst is noted also stable from the prior study. No obstructive changes are seen. The bladder is well distended. Stomach/Bowel: Scattered diverticular change of the colon is noted without evidence of diverticulitis. The appendix is within normal limits. No obstructive changes of the colon are seen. Small bowel and stomach are within normal limits. Vascular/Lymphatic: Aortic atherosclerosis. No enlarged abdominal or pelvic lymph nodes. Reproductive: Uterus and bilateral adnexa are unremarkable. Other: No abdominal wall hernia or abnormality. No abdominopelvic  ascites. Musculoskeletal: Mild degenerative changes of lumbar spine are noted with anterolisthesis of L4 on L5. IMPRESSION: Cholelithiasis with evidence of gallbladder wall thickening and likely wall edema. Right upper quadrant ultrasound may be helpful for further evaluation as this may represent acute cholecystitis. Diverticulosis without diverticulitis. Simple renal cysts bilaterally stable from prior exam. No further follow-up is recommended. Electronically Signed   By: Alcide Clever M.D.   On: 08/07/2022 20:33   DG Chest Portable 1 View  Result Date: 08/07/2022 CLINICAL DATA:  Epigastric  pain EXAM: PORTABLE CHEST 1 VIEW COMPARISON:  Chest x-ray 01/29/2022 FINDINGS: The heart size and mediastinal contours are within normal limits. Both lungs are clear. There is blunting of the left costophrenic angle. There is no pneumothorax. The visualized skeletal structures are unremarkable. IMPRESSION: Blunting of the left costophrenic angle may be due to pleural thickening or small pleural effusion. Electronically Signed   By: Darliss Cheney M.D.   On: 08/07/2022 18:01    EKG: Independently reviewed. Sinus rhythm.   Assessment/Plan   1. Acute biliary pancreatitis  - Appreciate surgery consultation  - Continue bowel-rest, IVF hydration, pain-control    2. Sepsis  - Fever with rigors, mild tachycardia, SBP as low as 94, and lactate 3.5 in ED  - There is no jaundice and no evidence for biliary obstruction or acute cholecystitis on imaging  - Blood cultures were collected in ED and Zosyn started  - Continue Zosyn, follow cultures and clinical course   3. Type II DM  - A1c was 7.3% in November 2023  - Check CBGs and use low-intensity SSI for now    4. CKD 3B  - SCr is 1.45 on admission, appears close to baseline  - Renally-dose medications, monitor    5. PAF  - No longer anticoagulated d/t fall risk  - Continue amiodarone   6. Rheumatoid arthritis  - Managed with prednisone 5 mg daily     DVT  prophylaxis: sq heparin  Code Status: DNR  Level of Care: Level of care: Progressive Family Communication: Son and daughter at bedside  Disposition Plan:  Patient is from: Home  Anticipated d/c is to: TBD Anticipated d/c date is: 08/11/22  Patient currently: Pending pain-control, treatment of pancreatitis and likely sepsis  Consults called: Surgery  Admission status: Inpatient     Briscoe Deutscher, MD [facility]  08/07/2022, 10:14 PM

## 2022-08-07 NOTE — ED Provider Notes (Addendum)
[place] [facility] AT [facility] Provider Note   CSN: [id number] Arrival date & time: 08/07/22  1657     History  Chief Complaint  Patient presents with   Chest Pain    Shelby Walker is a 89 y.o. female.   Chest Pain Patient presents with chest pain.  States it goes from the chest to the back.  States it goes from her chest to her back.  Points to her epigastric area however to where the pain is.  Has had some nausea.  No vomiting.  Has been doing well the last few days.  Previous history of coronary artery disease.  Gallstones/sludge.    Past Medical History:  Diagnosis Date   Bruit    Carotid Doppler showed no significant abnormality     9per patient)   C. difficile colitis 07/2018   with severe sepsis   Chest pain, unspecified    Nuclear, May, 2008, no scar or ischemia   Diabetes mellitus    Diverticulosis    Dyslipidemia    Ejection fraction    EF 55-60%, echo, February, 2011 // Echocardiogram 8/21: EF 60-65, no RWMA, Gr 1 DD, GLS -14%, normal RVSF, mild LAE, trivial MR, mild MS (mean 4 mmHg), RVSP 23.4    GERD (gastroesophageal reflux disease)    Mitral regurgitation 04/21/2009   mild,  echo, February, 2011   Osteoporosis    Palpitations    possible very brief atrial fibrillation on monitor and possible reentrant tachycardia   Psoriasis    Rheumatoid arthritis (HCC)     Home Medications Prior to Admission medications   Medication Sig Start Date End Date Taking? Authorizing Provider  acetaminophen (TYLENOL) 325 MG tablet Take 1 tablet (325 mg total) by mouth every 6 (six) hours. 02/15/22   Setzer, Lynnell Jude, PA-C  amiodarone (PACERONE) 200 MG tablet Take 0.5 tablets (100 mg total) by mouth daily. 04/02/22   Duke Salvia, MD  atorvastatin (LIPITOR) 20 MG tablet Take 1 tablet (20 mg total) by mouth daily. 02/28/22   Nahser, Deloris Ping, MD  Calcium Carbonate-Vitamin D 600-400 MG-UNIT tablet Take 1 tablet by mouth daily at 12 noon.     [provider]  cefadroxil (DURICEF) 500 MG capsule Take 1 capsule (500 mg total) by mouth 2 (two) times daily. Patient not taking: Reported on 07/23/2022 05/09/22   Daiva Eves, Lisette Grinder, MD  Chlorhexidine Gluconate Cloth 2 % PADS Apply 6 each topically every 12 (twelve) hours. Patient not taking: Reported on 07/23/2022 02/15/22   Milinda Antis, PA-C  diclofenac Sodium (VOLTAREN) 1 % GEL Apply 4 g topically 4 (four) times daily. 02/15/22   Setzer, Lynnell Jude, PA-C  feeding supplement (ENSURE ENLIVE / ENSURE PLUS) LIQD Take 237 mLs by mouth 2 (two) times daily with a meal. 02/15/22   Setzer, Lynnell Jude, PA-C  gabapentin (NEURONTIN) 100 MG capsule Take 1 capsule (100 mg total) by mouth 3 (three) times daily. 02/06/21   Love, Evlyn Kanner, PA-C  insulin aspart (NOVOLOG) 100 UNIT/ML FlexPen Inject 2 Units into the skin 3 (three) times daily with meals. Sliding scale 02/15/22   Setzer, Lynnell Jude, PA-C  Insulin Degludec (TRESIBA) 100 UNIT/ML SOLN Inject 4 Units into the skin at bedtime. 02/15/22   Setzer, Lynnell Jude, PA-C  lidocaine (LIDODERM) 5 % Place 1 patch onto the skin daily. Remove & Discard patch within 12 hours or as directed by MD 02/15/22   Valetta Fuller, Lynnell Jude, PA-C  megestrol (  MEGACE) 400 MG/10ML suspension Take 10 mLs (400 mg total) by mouth 2 (two) times daily. Patient not taking: Reported on 07/23/2022 02/15/22   Milinda Antis, PA-C  methocarbamol (ROBAXIN) 500 MG tablet Take 1 tablet (500 mg total) by mouth every 6 (six) hours as needed for muscle spasms. 02/15/22   Setzer, Lynnell Jude, PA-C  Multiple Vitamins-Minerals (CENTRUM SILVER 50+WOMEN PO) Take 1 tablet by mouth daily.    [provider]  nitroGLYCERIN (NITROSTAT) 0.4 MG SL tablet Dissolve 1 tablet under the tongue every 5 minutes as needed for chest pain. Max of 3 doses, then 911. 07/23/22   Nahser, Deloris Ping, MD  nystatin (MYCOSTATIN) 100000 UNIT/ML suspension  03/01/22   [provider]  pantoprazole (PROTONIX) 40 MG tablet Take  1 tablet (40 mg total) by mouth daily. 02/02/22   Leatha Gilding, MD  polyethylene glycol (MIRALAX / GLYCOLAX) 17 g packet Take 17 g by mouth daily as needed for mild constipation. 02/15/22   Setzer, Lynnell Jude, PA-C  predniSONE (DELTASONE) 5 MG tablet Take 1 tablet (5 mg total) by mouth daily with breakfast. 02/15/22   Setzer, Lynnell Jude, PA-C  pyrithione zinc (SELSUN BLUE DRY SCALP) 1 % shampoo Apply 1 application topically once a week. 04/15/15   [provider]  RESTASIS 0.05 % ophthalmic emulsion Place 1 drop into both eyes 2 (two) times daily. 02/13/19   [provider]  saccharomyces boulardii (FLORASTOR) 250 MG capsule Take 250 mg by mouth 2 (two) times daily.    [provider]  silver sulfADIAZINE (SILVADENE) 1 % cream Apply 1 Application topically daily. 04/17/22   Felecia Shelling, DPM  sodium chloride 0.9 % infusion Inject 0-10 mLs into the vein as needed (for administration of IV medications (carrier fluid)). Patient not taking: Reported on 05/14/2022 02/15/22   Valetta Fuller, Lynnell Jude, PA-C  traZODone (DESYREL) 50 MG tablet Take 0.5-1 tablets (25-50 mg total) by mouth at bedtime as needed for sleep. 02/15/22   Setzer, Lynnell Jude, PA-C      Allergies    Cholestyramine, Compazine [prochlorperazine edisylate], Celecoxib, Sulfonamide derivatives, Hydrocodone, Infliximab, Leflunomide, Prochlorperazine, Rosuvastatin, and Sulfa antibiotics    Review of Systems   Review of Systems  Cardiovascular:  Positive for chest pain.    Physical Exam Updated Vital Signs BP (!) 137/47   Pulse 100   Temp (!) 102 F (38.9 C) (Oral)   Resp 19   SpO2 100%  Physical Exam Vitals and nursing note reviewed.  Cardiovascular:     Rate and Rhythm: Regular rhythm.  Pulmonary:     Breath sounds: No wheezing or rhonchi.  Chest:     Chest wall: No tenderness.  Abdominal:     Tenderness: There is abdominal tenderness.     Comments: Epigastric tenderness without rebound or guarding.  No  hernia palpated.  Musculoskeletal:     Right lower leg: No edema.     Left lower leg: No edema.  Skin:    General: Skin is warm.  Neurological:     Mental Status: She is alert.     ED Results / Procedures / Treatments   Labs (all labs ordered are listed, but only abnormal results are displayed) Labs Reviewed  COMPREHENSIVE METABOLIC PANEL - Abnormal; Notable for the following components:      Result Value   CO2 21 (*)    Glucose, Bld 228 (*)    BUN 63 (*)    Creatinine, Ser 1.45 (*)  Calcium 8.5 (*)    Total Protein 5.8 (*)    Albumin 3.1 (*)    AST 310 (*)    ALT 149 (*)    GFR, Estimated 35 (*)    All other components within normal limits  LIPASE, BLOOD - Abnormal; Notable for the following components:   Lipase 8,872 (*)    All other components within normal limits  CBC WITH DIFFERENTIAL/PLATELET - Abnormal; Notable for the following components:   RBC 3.25 (*)    Hemoglobin 9.2 (*)    HCT 30.0 (*)    RDW 16.1 (*)    Neutro Abs 8.7 (*)    Lymphs Abs 0.6 (*)    All other components within normal limits  LACTIC ACID, PLASMA - Abnormal; Notable for the following components:   Lactic Acid, Venous 3.5 (*)    All other components within normal limits  CULTURE, BLOOD (ROUTINE X 2)  CULTURE, BLOOD (ROUTINE X 2)  LACTIC ACID, PLASMA  TROPONIN I (HIGH SENSITIVITY)  TROPONIN I (HIGH SENSITIVITY)    EKG EKG Interpretation  Date/Time:  Tuesday August 07 2022 17:05:35 EDT Ventricular Rate:  80 PR Interval:  166 QRS Duration: 88 QT Interval:  408 QTC Calculation: 471 R Axis:   64 Text Interpretation: Sinus rhythm Low voltage, precordial leads Confirmed by Benjiman Core [phone]) on 08/07/2022 6:06:33 PM  Radiology CT ABDOMEN PELVIS W CONTRAST  Result Date: 08/07/2022 CLINICAL DATA:  Epigastric pain EXAM: CT ABDOMEN AND PELVIS WITH CONTRAST TECHNIQUE: Multidetector CT imaging of the abdomen and pelvis was performed using the standard protocol following bolus  administration of intravenous contrast. RADIATION DOSE REDUCTION: This exam was performed according to the departmental dose-optimization program which includes automated exposure control, adjustment of the mA and/or kV according to patient size and/or use of iterative reconstruction technique. CONTRAST:  50mL OMNIPAQUE IOHEXOL 350 MG/ML SOLN COMPARISON:  01/24/2022 FINDINGS: Lower chest: Lung bases show mild left basilar atelectasis. No focal infiltrate or effusion is seen. Hepatobiliary: Liver is well visualized and within normal limits. Gallbladder is well distended with some dependent density consistent with small gallstones. Wall thickening and likely wall edema is noted. No obstructive changes are seen. Pancreas: Unremarkable. No pancreatic ductal dilatation or surrounding inflammatory changes. Spleen: Normal in size without focal abnormality. Adrenals/Urinary Tract: Adrenal glands are within normal limits. Kidneys are well visualized bilaterally within normal enhancement pattern. No renal calculi are noted. Simple appearing right renal cyst is noted measuring 2.8 cm. This is similar to that seen on the prior exam. No further follow-up is recommended. Smaller left renal cyst is noted also stable from the prior study. No obstructive changes are seen. The bladder is well distended. Stomach/Bowel: Scattered diverticular change of the colon is noted without evidence of diverticulitis. The appendix is within normal limits. No obstructive changes of the colon are seen. Small bowel and stomach are within normal limits. Vascular/Lymphatic: Aortic atherosclerosis. No enlarged abdominal or pelvic lymph nodes. Reproductive: Uterus and bilateral adnexa are unremarkable. Other: No abdominal wall hernia or abnormality. No abdominopelvic ascites. Musculoskeletal: Mild degenerative changes of lumbar spine are noted with anterolisthesis of L4 on L5. IMPRESSION: Cholelithiasis with evidence of gallbladder wall thickening and  likely wall edema. Right upper quadrant ultrasound may be helpful for further evaluation as this may represent acute cholecystitis. Diverticulosis without diverticulitis. Simple renal cysts bilaterally stable from prior exam. No further follow-up is recommended. Electronically Signed   By: Alcide Clever M.D.   On: 08/07/2022 20:33   DG Chest  Portable 1 View  Result Date: 08/07/2022 CLINICAL DATA:  Epigastric pain EXAM: PORTABLE CHEST 1 VIEW COMPARISON:  Chest x-ray 01/29/2022 FINDINGS: The heart size and mediastinal contours are within normal limits. Both lungs are clear. There is blunting of the left costophrenic angle. There is no pneumothorax. The visualized skeletal structures are unremarkable. IMPRESSION: Blunting of the left costophrenic angle may be due to pleural thickening or small pleural effusion. Electronically Signed   By: Darliss Cheney M.D.   On: 08/07/2022 18:01    Procedures Procedures    Medications Ordered in ED Medications  piperacillin-tazobactam (ZOSYN) IVPB 3.375 g (3.375 g Intravenous New Bag/Given 08/07/22 2037)  fentaNYL (SUBLIMAZE) injection 25 mcg (25 mcg Intravenous Given 08/07/22 1724)  ondansetron (ZOFRAN) injection 4 mg (4 mg Intravenous Given 08/07/22 1724)  lactated ringers bolus 1,000 mL (1,000 mLs Intravenous New Bag/Given 08/07/22 1924)  lactated ringers bolus 1,000 mL (1,000 mLs Intravenous New Bag/Given 08/07/22 1924)  iohexol (OMNIPAQUE) 350 MG/ML injection 50 mL (50 mLs Intravenous Contrast Given 08/07/22 2020)    ED Course/ Medical Decision Making/ A&P                             Medical Decision Making Amount and/or Complexity of Data Reviewed Labs: ordered. Radiology: ordered.  Risk Prescription drug management. Decision regarding hospitalization.   Patient was epigastric pain.  Rather acute onset.  Goes to back.  Differential diagnosis does include cardiac cause but also other causes such as biliary disease, pancreatitis.  Will get x-ray blood work and  potentially CT scan or ultrasound.  Reviewed previous cardiology note.  Cardiac workup reassuring.  However did later develop a fever.  Single measurement of low blood pressure and fluid given.  There was a delay in the blood work resulting.  Lactic acid mildly elevated below for.  After lipase resulted it was elevated.  Pancreatitis can be the cause of fever without frank infection.  CT scan done and does show potential cholecystitis.  Antibiotics started at that point.  Discussed with Dr. Derrell Lolling from general surgery.  Recommends medicine admission.  Not plan for OR at this time.  Patient is also seen Dr. Leone Payor from GI previously.  Ascending cholangitis felt less likely but considered.  CRITICAL CARE Performed by: Benjiman Core Total critical care time: 30 minutes Critical care time was exclusive of separately billable procedures and treating other patients. Critical care was necessary to treat or prevent imminent or life-threatening deterioration. Critical care was time spent personally by me on the following activities: development of treatment plan with patient and/or surrogate as well as nursing, discussions with consultants, evaluation of patient's response to treatment, examination of patient, obtaining history from patient or surrogate, ordering and performing treatments and interventions, ordering and review of laboratory studies, ordering and review of radiographic studies, pulse oximetry and re-evaluation of patient's condition.  Discussed with Dr. Antionette Char, who will admit patient.  Ultrasound is now returned and does not show cholecystitis.         Final Clinical Impression(s) / ED Diagnoses Final diagnoses:  Acute biliary pancreatitis, unspecified complication status  Cholecystitis    Rx / DC Orders ED Discharge Orders     None         Benjiman Core, MD 08/07/22 2105    Benjiman Core, MD 08/07/22 2140

## 2022-08-07 NOTE — ED Notes (Signed)
ED TO INPATIENT HANDOFF REPORT  ED Nurse Name and Phone #: Joya, RN  S Name/Age/Gender Shelby Walker 89 y.o. female Room/Bed: 010C/010C  Code Status   Code Status: DNR  Home/SNF/Other Home Patient oriented to: self, place, time, and situation Is this baseline? Yes   Triage Complete: Triage complete  Chief Complaint Acute biliary pancreatitis [K85.10]  Triage Note Pt to ED via EMS from home. Pt c/o severe chest pain radiating to back since 1pm. Pt also c/o N/V. EMS gave 324 ASA and 4 zofran en route and pt reports taking 1 nitro at home PTA. Pt also endorses mild SOB.   EMS Vitals: 20 LAC 70 HR    Allergies Allergies  Allergen Reactions   Cholestyramine     Possible- Makes throat burn. Patient said she can't take it    Compazine [Prochlorperazine Edisylate] Swelling    REACTION: " tongue swell and unable to swallow"   Celecoxib Diarrhea   Sulfonamide Derivatives Other (See Comments)    REACTION: " broke out with fine itching bumps"   Hydrocodone Other (See Comments)   Infliximab Other (See Comments)    Other reaction(s): rash   Leflunomide Other (See Comments)    Other reaction(s): diarrhea   Prochlorperazine Other (See Comments)    Other reaction(s): Unknown  Other Reaction(s): tongue swells   Rosuvastatin     Other reaction(s): muscle aches   Sulfa Antibiotics Other (See Comments)    Other reaction(s): tongue swelling    Level of Care/Admitting Diagnosis ED Disposition     ED Disposition  Admit   Condition  --   Comment  Hospital Area: [facility] [[id number]]  Level of Care: Progressive [102]  Admit to Progressive based on following criteria: MULTISYSTEM THREATS such as stable sepsis, metabolic/electrolyte imbalance with or without encephalopathy that is responding to early treatment.  May admit patient to [facility] or [name] if equivalent level of care is available:: Yes  Covid Evaluation: Asymptomatic - no recent exposure (last  10 days) testing not required  Diagnosis: Acute biliary pancreatitis [161096]  Admitting Physician: Briscoe Deutscher [[id number]]  Attending Physician: Briscoe Deutscher [[id number]]  Certification:: I certify this patient will need inpatient services for at least 2 midnights  Estimated Length of Stay: 4          B Medical/Surgery History Past Medical History:  Diagnosis Date   Bruit    Carotid Doppler showed no significant abnormality     9per patient)   C. difficile colitis 07/2018   with severe sepsis   Chest pain, unspecified    Nuclear, May, 2008, no scar or ischemia   Diabetes mellitus    Diverticulosis    Dyslipidemia    Ejection fraction    EF 55-60%, echo, February, 2011 // Echocardiogram 8/21: EF 60-65, no RWMA, Gr 1 DD, GLS -14%, normal RVSF, mild LAE, trivial MR, mild MS (mean 4 mmHg), RVSP 23.4    GERD (gastroesophageal reflux disease)    Mitral regurgitation 04/21/2009   mild,  echo, February, 2011   Osteoporosis    Palpitations    possible very brief atrial fibrillation on monitor and possible reentrant tachycardia   Psoriasis    Rheumatoid arthritis (HCC)    Past Surgical History:  Procedure Laterality Date   ABDOMINAL AORTOGRAM W/LOWER EXTREMITY Right 10/19/2020   Procedure: ABDOMINAL AORTOGRAM W/LOWER EXTREMITY;  Surgeon: Iran Ouch, MD;  Location: [facility] INVASIVE CV LAB;  Service: Cardiovascular;  Laterality: Right;   FRACTURE  SURGERY     LOOP RECORDER REMOVAL N/A 01/05/2022   Procedure: LOOP RECORDER REMOVAL;  Surgeon: Marinus Maw, MD;  Location: [facility] INVASIVE CV LAB;  Service: Cardiovascular;  Laterality: N/A;   PACEMAKER IMPLANT N/A 01/05/2022   Procedure: PACEMAKER IMPLANT;  Surgeon: Marinus Maw, MD;  Location: [facility] INVASIVE CV LAB;  Service: Cardiovascular;  Laterality: N/A;   PPM GENERATOR REMOVAL N/A 01/29/2022   Procedure: PPM GENERATOR REMOVAL;  Surgeon: Marinus Maw, MD;  Location: [facility] INVASIVE CV LAB;  Service: Cardiovascular;  Laterality: N/A;    TEE WITHOUT CARDIOVERSION N/A 01/29/2022   Procedure: TRANSESOPHAGEAL ECHOCARDIOGRAM (TEE);  Surgeon: Sande Rives, MD;  Location: [facility] ENDOSCOPY;  Service: Cardiovascular;  Laterality: N/A;   TRANSMETATARSAL AMPUTATION Right 01/18/2021   Procedure: TRANSMETATARSAL AMPUTATION;  Surgeon: Toni Arthurs, MD;  Location: [facility] OR;  Service: Orthopedics;  Laterality: Right;     A IV Location/Drains/Wounds Patient Lines/Drains/Airways Status     Active Line/Drains/Airways     Name Placement date Placement time Site Days   Peripheral IV 08/07/22 20 G Left Antecubital 08/07/22  1722  Antecubital  less than 1   Peripheral IV 08/07/22 20 G Anterior;Right Forearm 08/07/22  1924  Forearm  less than 1   PICC Single Lumen 01/31/22 Right Basilic 37 cm 0 cm 01/31/22  1610  Basilic  188   Wound / Incision (Open or Dehisced) 01/29/22 Other (Comment) Chest Left;Upper 01/29/22  1149  Chest  190            Intake/Output Last 24 hours No intake or output data in the 24 hours ending 08/07/22 2218  Labs/Imaging Results for orders placed or performed during the hospital encounter of 08/07/22 (from the past 48 hour(s))  Comprehensive metabolic panel     Status: Abnormal   Collection Time: 08/07/22  5:10 PM  Result Value Ref Range   Sodium 140 135 - 145 mmol/L   Potassium 4.2 3.5 - 5.1 mmol/L   Chloride 107 98 - 111 mmol/L   CO2 21 (L) 22 - 32 mmol/L   Glucose, Bld 228 (H) 70 - 99 mg/dL    Comment: Glucose reference range applies only to samples taken after fasting for at least 8 hours.   BUN 63 (H) 8 - 23 mg/dL   Creatinine, Ser 9.60 (H) 0.44 - 1.00 mg/dL   Calcium 8.5 (L) 8.9 - 10.3 mg/dL   Total Protein 5.8 (L) 6.5 - 8.1 g/dL   Albumin 3.1 (L) 3.5 - 5.0 g/dL   AST 454 (H) 15 - 41 U/L   ALT 149 (H) 0 - 44 U/L   Alkaline Phosphatase 59 38 - 126 U/L   Total Bilirubin 1.2 0.3 - 1.2 mg/dL   GFR, Estimated 35 (L) >60 mL/min    Comment: (NOTE) Calculated using the CKD-EPI Creatinine Equation  (2021)    Anion gap 12 5 - 15    Comment: Performed at [facility], [address], [place], [place] [address]  Lipase, blood     Status: Abnormal   Collection Time: 08/07/22  5:10 PM  Result Value Ref Range   Lipase 8,872 (H) 11 - 51 U/L    Comment: RESULTS CONFIRMED BY MANUAL DILUTION Performed at [facility], [address], [place], [place] [address]   CBC with Differential     Status: Abnormal   Collection Time: 08/07/22  5:10 PM  Result Value Ref Range   WBC 9.6 4.0 - 10.5 K/uL  RBC 3.25 (L) 3.87 - 5.11 MIL/uL   Hemoglobin 9.2 (L) 12.0 - 15.0 g/dL   HCT 16.1 (L) 09.6 - 04.5 %   MCV 92.3 80.0 - 100.0 fL   MCH 28.3 26.0 - 34.0 pg   MCHC 30.7 30.0 - 36.0 g/dL   RDW 40.9 (H) 81.1 - 91.4 %   Platelets 194 150 - 400 K/uL   nRBC 0.0 0.0 - 0.2 %   Neutrophils Relative % 91 %   Neutro Abs 8.7 (H) 1.7 - 7.7 K/uL   Lymphocytes Relative 7 %   Lymphs Abs 0.6 (L) 0.7 - 4.0 K/uL   Monocytes Relative 2 %   Monocytes Absolute 0.2 0.1 - 1.0 K/uL   Eosinophils Relative 0 %   Eosinophils Absolute 0.0 0.0 - 0.5 K/uL   Basophils Relative 0 %   Basophils Absolute 0.0 0.0 - 0.1 K/uL   Immature Granulocytes 0 %   Abs Immature Granulocytes 0.04 0.00 - 0.07 K/uL    Comment: Performed at [facility], [address] [place], [place] [address]  Troponin I (High Sensitivity)     Status: None   Collection Time: 08/07/22  5:10 PM  Result Value Ref Range   Troponin I (High Sensitivity) 7 <18 ng/L    Comment: (NOTE) Elevated high sensitivity troponin I (hsTnI) values and significant  changes across serial measurements may suggest ACS but many other  chronic and acute conditions are known to elevate hsTnI results.  Refer to the Links section for chest pain algorithms and additional  guidance. Performed at [facility], [address], [place], [place] [address]   Troponin I (High Sensitivity)     Status: None   Collection Time: 08/07/22  6:48 PM  Result Value Ref  Range   Troponin I (High Sensitivity) 11 <18 ng/L    Comment: (NOTE) Elevated high sensitivity troponin I (hsTnI) values and significant  changes across serial measurements may suggest ACS but many other  chronic and acute conditions are known to elevate hsTnI results.  Refer to the "Links" section for chest pain algorithms and additional  guidance. Performed at [facility], [address], [place], [place] [address]   Lactic acid, plasma     Status: Abnormal   Collection Time: 08/07/22  7:15 PM  Result Value Ref Range   Lactic Acid, Venous 3.5 (HH) 0.5 - 1.9 mmol/L    Comment: CRITICAL RESULT CALLED TO, READ BACK BY AND VERIFIED WITH Volney American RN @ 2025 08/07/22 JBUTLER Performed at [facility], [address] [place], [place] [address]    US Abdomen Limited RUQ (LIVER/GB)  Result Date: 08/07/2022 CLINICAL DATA:  Right upper quadrant pain. EXAM: ULTRASOUND ABDOMEN LIMITED RIGHT UPPER QUADRANT COMPARISON:  Aug 02, 2018 FINDINGS: Gallbladder: Small echogenic gallstones and/or gallbladder sludge is suspected within the region of the gallbladder fundus. There is no evidence of gallbladder wall thickening (2.5 mm) no sonographic Murphy sign noted by sonographer. Common bile duct: Diameter: 7.1 mm Liver: No focal lesion identified. Within normal limits in parenchymal echogenicity. Portal vein is patent on color Doppler imaging with normal direction of blood flow towards the liver. Other: A 2.6 cm x 1.8 cm x 2.1 cm simple cyst is seen within the upper pole of the right kidney. IMPRESSION: 1. Cholelithiasis and/or gallbladder sludge, without evidence of acute cholecystitis. 2. Simple right renal cyst. Electronically Signed   By: Aram Candela M.D.   On: 08/07/2022 21:17  CT ABDOMEN PELVIS W CONTRAST  Result Date: 08/07/2022 CLINICAL DATA:  Epigastric pain EXAM: CT ABDOMEN AND PELVIS WITH CONTRAST TECHNIQUE: Multidetector CT imaging of the abdomen and pelvis was performed using the  standard protocol following bolus administration of intravenous contrast. RADIATION DOSE REDUCTION: This exam was performed according to the departmental dose-optimization program which includes automated exposure control, adjustment of the mA and/or kV according to patient size and/or use of iterative reconstruction technique. CONTRAST:  50mL OMNIPAQUE IOHEXOL 350 MG/ML SOLN COMPARISON:  01/24/2022 FINDINGS: Lower chest: Lung bases show mild left basilar atelectasis. No focal infiltrate or effusion is seen. Hepatobiliary: Liver is well visualized and within normal limits. Gallbladder is well distended with some dependent density consistent with small gallstones. Wall thickening and likely wall edema is noted. No obstructive changes are seen. Pancreas: Unremarkable. No pancreatic ductal dilatation or surrounding inflammatory changes. Spleen: Normal in size without focal abnormality. Adrenals/Urinary Tract: Adrenal glands are within normal limits. Kidneys are well visualized bilaterally within normal enhancement pattern. No renal calculi are noted. Simple appearing right renal cyst is noted measuring 2.8 cm. This is similar to that seen on the prior exam. No further follow-up is recommended. Smaller left renal cyst is noted also stable from the prior study. No obstructive changes are seen. The bladder is well distended. Stomach/Bowel: Scattered diverticular change of the colon is noted without evidence of diverticulitis. The appendix is within normal limits. No obstructive changes of the colon are seen. Small bowel and stomach are within normal limits. Vascular/Lymphatic: Aortic atherosclerosis. No enlarged abdominal or pelvic lymph nodes. Reproductive: Uterus and bilateral adnexa are unremarkable. Other: No abdominal wall hernia or abnormality. No abdominopelvic ascites. Musculoskeletal: Mild degenerative changes of lumbar spine are noted with anterolisthesis of L4 on L5. IMPRESSION: Cholelithiasis with evidence of  gallbladder wall thickening and likely wall edema. Right upper quadrant ultrasound may be helpful for further evaluation as this may represent acute cholecystitis. Diverticulosis without diverticulitis. Simple renal cysts bilaterally stable from prior exam. No further follow-up is recommended. Electronically Signed   By: Alcide Clever M.D.   On: 08/07/2022 20:33   DG Chest Portable 1 View  Result Date: 08/07/2022 CLINICAL DATA:  Epigastric pain EXAM: PORTABLE CHEST 1 VIEW COMPARISON:  Chest x-ray 01/29/2022 FINDINGS: The heart size and mediastinal contours are within normal limits. Both lungs are clear. There is blunting of the left costophrenic angle. There is no pneumothorax. The visualized skeletal structures are unremarkable. IMPRESSION: Blunting of the left costophrenic angle may be due to pleural thickening or small pleural effusion. Electronically Signed   By: Darliss Cheney M.D.   On: 08/07/2022 18:01    Pending Labs Unresulted Labs (From admission, onward)     Start     Ordered   08/08/22 0500  Hemoglobin A1c  Tomorrow morning,   R       Comments: To assess prior glycemic control    08/07/22 2213   08/08/22 0500  Comprehensive metabolic panel  Daily,   R      08/07/22 2213   08/08/22 0500  Magnesium  Tomorrow morning,   R        08/07/22 2213   08/08/22 0500  Phosphorus  Tomorrow morning,   R        08/07/22 2213   08/08/22 0500  CBC  Daily,   R      08/07/22 2213   08/08/22 0500  Lipase, blood  Tomorrow morning,   R  08/07/22 2213   08/07/22 1900  Lactic acid, plasma  Now then every 2 hours,   R      08/07/22 1859   08/07/22 1900  Culture, blood (routine x 2)  BLOOD CULTURE X 2,   R      08/07/22 1859            Vitals/Pain Today's Vitals   08/07/22 2000 08/07/22 2030 08/07/22 2033 08/07/22 2100  BP: 125/81 (!) 137/43  (!) 137/47  Pulse: 97 100  100  Resp: 20 (!) 22  19  Temp:   (!) 102 F (38.9 C)   TempSrc:   Oral   SpO2: 99% 100%  100%  PainSc:         Isolation Precautions No active isolations  Medications Medications  amiodarone (PACERONE) tablet 100 mg (has no administration in time range)  atorvastatin (LIPITOR) tablet 20 mg (has no administration in time range)  mirtazapine (REMERON) tablet 7.5 mg (has no administration in time range)  gabapentin (NEURONTIN) capsule 200 mg (has no administration in time range)  methocarbamol (ROBAXIN) tablet 500 mg (has no administration in time range)  cycloSPORINE (RESTASIS) 0.05 % ophthalmic emulsion 1 drop (has no administration in time range)  diclofenac Sodium (VOLTAREN) 1 % topical gel 4 g (has no administration in time range)  predniSONE (DELTASONE) tablet 5 mg (has no administration in time range)  insulin aspart (novoLOG) injection 0-6 Units (has no administration in time range)  heparin injection 5,000 Units (has no administration in time range)  sodium chloride flush (NS) 0.9 % injection 3 mL (has no administration in time range)  acetaminophen (TYLENOL) tablet 650 mg (has no administration in time range)    Or  acetaminophen (TYLENOL) suppository 650 mg (has no administration in time range)  fentaNYL (SUBLIMAZE) injection 12.5-50 mcg (has no administration in time range)  ondansetron (ZOFRAN) tablet 4 mg (has no administration in time range)    Or  ondansetron (ZOFRAN) injection 4 mg (has no administration in time range)  lactated ringers infusion (has no administration in time range)  fentaNYL (SUBLIMAZE) injection 25 mcg (25 mcg Intravenous Given 08/07/22 1724)  ondansetron (ZOFRAN) injection 4 mg (4 mg Intravenous Given 08/07/22 1724)  lactated ringers bolus 1,000 mL (0 mLs Intravenous Stopped 08/07/22 2125)  lactated ringers bolus 1,000 mL (0 mLs Intravenous Stopped 08/07/22 2125)  iohexol (OMNIPAQUE) 350 MG/ML injection 50 mL (50 mLs Intravenous Contrast Given 08/07/22 2020)  piperacillin-tazobactam (ZOSYN) IVPB 3.375 g (0 g Intravenous Stopped 08/07/22 2125)    Mobility walks with  person assist     Focused Assessments Neuro Assessment Handoff:  Swallow screen pass? Yes  Cardiac Rhythm: Normal sinus rhythm       Neuro Assessment:   Neuro Checks:      Has TPA been given? No If patient is a Neuro Trauma and patient is going to OR before floor call report to 4N Charge nurse: [phone] or [phone]   R Recommendations: See Admitting Provider Note  Report given to:   Additional Notes: pt is AAOx4. Pt is on room air.

## 2022-08-08 ENCOUNTER — Inpatient Hospital Stay (HOSPITAL_COMMUNITY): Payer: Medicare Other

## 2022-08-08 ENCOUNTER — Other Ambulatory Visit: Payer: Self-pay

## 2022-08-08 ENCOUNTER — Telehealth: Payer: Self-pay | Admitting: Internal Medicine

## 2022-08-08 DIAGNOSIS — K851 Biliary acute pancreatitis without necrosis or infection: Secondary | ICD-10-CM | POA: Diagnosis not present

## 2022-08-08 DIAGNOSIS — I480 Paroxysmal atrial fibrillation: Secondary | ICD-10-CM | POA: Diagnosis not present

## 2022-08-08 DIAGNOSIS — E876 Hypokalemia: Secondary | ICD-10-CM

## 2022-08-08 DIAGNOSIS — I2510 Atherosclerotic heart disease of native coronary artery without angina pectoris: Secondary | ICD-10-CM | POA: Diagnosis not present

## 2022-08-08 DIAGNOSIS — N1832 Chronic kidney disease, stage 3b: Secondary | ICD-10-CM | POA: Diagnosis not present

## 2022-08-08 DIAGNOSIS — Z01818 Encounter for other preprocedural examination: Secondary | ICD-10-CM

## 2022-08-08 DIAGNOSIS — A419 Sepsis, unspecified organism: Secondary | ICD-10-CM | POA: Diagnosis not present

## 2022-08-08 LAB — COMPREHENSIVE METABOLIC PANEL
ALT: 2504 U/L — ABNORMAL HIGH (ref 0–44)
AST: 4600 U/L — ABNORMAL HIGH (ref 15–41)
Albumin: 2.6 g/dL — ABNORMAL LOW (ref 3.5–5.0)
Alkaline Phosphatase: 155 U/L — ABNORMAL HIGH (ref 38–126)
Anion gap: 13 (ref 5–15)
BUN: 64 mg/dL — ABNORMAL HIGH (ref 8–23)
CO2: 21 mmol/L — ABNORMAL LOW (ref 22–32)
Calcium: 7.7 mg/dL — ABNORMAL LOW (ref 8.9–10.3)
Chloride: 102 mmol/L (ref 98–111)
Creatinine, Ser: 2 mg/dL — ABNORMAL HIGH (ref 0.44–1.00)
GFR, Estimated: 24 mL/min — ABNORMAL LOW (ref >60)
Glucose, Bld: 167 mg/dL — ABNORMAL HIGH (ref 70–99)
Potassium: 5 mmol/L (ref 3.5–5.1)
Sodium: 136 mmol/L (ref 135–145)
Total Bilirubin: 2.9 mg/dL — ABNORMAL HIGH (ref 0.3–1.2)
Total Protein: 5.3 g/dL — ABNORMAL LOW (ref 6.5–8.1)

## 2022-08-08 LAB — LIPID PANEL
Cholesterol: 111 mg/dL (ref 0–200)
HDL: 29 mg/dL — ABNORMAL LOW (ref >40)
LDL Cholesterol: 51 mg/dL (ref 0–99)
Total CHOL/HDL Ratio: 3.8 RATIO
Triglycerides: 153 mg/dL — ABNORMAL HIGH (ref <150)
VLDL: 31 mg/dL (ref 0–40)

## 2022-08-08 LAB — HEMOGLOBIN A1C
Hgb A1c MFr Bld: 9.2 % — ABNORMAL HIGH (ref 4.8–5.6)
Mean Plasma Glucose: 217 mg/dL

## 2022-08-08 LAB — BLOOD CULTURE ID PANEL (REFLEXED) - BCID2

## 2022-08-08 LAB — C-REACTIVE PROTEIN: CRP: 7.2 mg/dL — ABNORMAL HIGH (ref <1.0)

## 2022-08-08 LAB — CBC
HCT: 25.4 % — ABNORMAL LOW (ref 36.0–46.0)
Hemoglobin: 8.1 g/dL — ABNORMAL LOW (ref 12.0–15.0)
MCH: 28.9 pg (ref 26.0–34.0)
MCHC: 31.9 g/dL (ref 30.0–36.0)
MCV: 90.7 fL (ref 80.0–100.0)
Platelets: 159 10*3/uL (ref 150–400)
RBC: 2.8 MIL/uL — ABNORMAL LOW (ref 3.87–5.11)
RDW: 16.5 % — ABNORMAL HIGH (ref 11.5–15.5)
WBC: 9.3 10*3/uL (ref 4.0–10.5)
nRBC: 0 % (ref 0.0–0.2)

## 2022-08-08 LAB — CULTURE, BLOOD (ROUTINE X 2): Special Requests: ADEQUATE

## 2022-08-08 LAB — TYPE AND SCREEN
ABO/RH(D): A POS
Antibody Screen: NEGATIVE

## 2022-08-08 LAB — PHOSPHORUS: Phosphorus: 5.8 mg/dL — ABNORMAL HIGH (ref 2.5–4.6)

## 2022-08-08 LAB — GLUCOSE, CAPILLARY
Glucose-Capillary: 144 mg/dL — ABNORMAL HIGH (ref 70–99)
Glucose-Capillary: 141 mg/dL — ABNORMAL HIGH (ref 70–99)
Glucose-Capillary: 165 mg/dL — ABNORMAL HIGH (ref 70–99)
Glucose-Capillary: 182 mg/dL — ABNORMAL HIGH (ref 70–99)

## 2022-08-08 LAB — BRAIN NATRIURETIC PEPTIDE: B Natriuretic Peptide: 365.7 pg/mL — ABNORMAL HIGH (ref 0.0–100.0)

## 2022-08-08 LAB — MAGNESIUM: Magnesium: 2 mg/dL (ref 1.7–2.4)

## 2022-08-08 LAB — LIPASE, BLOOD: Lipase: 2876 U/L — ABNORMAL HIGH (ref 11–51)

## 2022-08-08 LAB — PROCALCITONIN: Procalcitonin: 51.5 ng/mL

## 2022-08-08 LAB — LACTIC ACID, PLASMA: Lactic Acid, Venous: 1.6 mmol/L (ref 0.5–1.9)

## 2022-08-08 LAB — ABO/RH: ABO/RH(D): A POS

## 2022-08-08 MED ORDER — METRONIDAZOLE 500 MG/100ML IV SOLN
500.0000 mg | Freq: Two times a day (BID) | INTRAVENOUS | Status: DC
  Administered 2022-08-08 – 2022-08-12 (×8): 500 mg via INTRAVENOUS
  Filled 2022-08-08 (×8): qty 100

## 2022-08-08 MED ORDER — SODIUM CHLORIDE 0.9 % IV SOLN
2.0000 g | INTRAVENOUS | Status: DC
  Administered 2022-08-08 – 2022-08-11 (×3): 2 g via INTRAVENOUS
  Filled 2022-08-08 (×4): qty 12.5

## 2022-08-08 MED ORDER — LACTATED RINGERS IV SOLN: INTRAVENOUS | Status: DC

## 2022-08-08 MED ORDER — HYDROCORTISONE SOD SUC (PF) 100 MG IJ SOLR
100.0000 mg | Freq: Two times a day (BID) | INTRAMUSCULAR | Status: DC
  Administered 2022-08-08 (×2): 100 mg via INTRAVENOUS
  Filled 2022-08-08 (×2): qty 2

## 2022-08-08 MED ORDER — PREDNISONE 20 MG PO TABS
20.0000 mg | ORAL_TABLET | Freq: Every day | ORAL | Status: DC
  Administered 2022-08-08: 20 mg via ORAL
  Filled 2022-08-08: qty 1

## 2022-08-08 MED ORDER — PIPERACILLIN-TAZOBACTAM IN DEX 2-0.25 GM/50ML IV SOLN
2.2500 g | Freq: Four times a day (QID) | INTRAVENOUS | Status: DC
  Filled 2022-08-08: qty 50

## 2022-08-08 MED ORDER — SODIUM ZIRCONIUM CYCLOSILICATE 10 G PO PACK
10.0000 g | PACK | Freq: Two times a day (BID) | ORAL | Status: AC
  Administered 2022-08-08 (×2): 10 g via ORAL
  Filled 2022-08-08 (×2): qty 1

## 2022-08-08 MED ORDER — PANTOPRAZOLE SODIUM 40 MG PO TBEC
40.0000 mg | DELAYED_RELEASE_TABLET | Freq: Every day | ORAL | Status: DC
  Administered 2022-08-08 – 2022-08-12 (×5): 40 mg via ORAL
  Filled 2022-08-08 (×5): qty 1

## 2022-08-08 NOTE — Consult Note (Addendum)
Cardiology Consultation   Patient ID: Shelby Walker MRN: [id number]; DOB: [birth year]  Admit date: 08/07/2022 Date of Consult: 08/08/2022  PCP:  Shelby Prince, MD   [facility] Providers Cardiologist:  Shelby Miss, MD  Electrophysiologist:  Shelby Manges, MD  {  Patient Profile:   Shelby Walker is a 89 y.o. female with a hx of PAD, HLD, DM, PAF, PPM for 25 sec pause explanted due to infection chest pain who is being seen 08/08/2022 for the evaluation of preoperative risk evaluation at the request of Dr. Thedore Walker.  History of Present Illness:   Shelby Walker has followed with Dr. Elease Walker for the above cardiac problems. She developed Afib in 2020 in the setting of C-diff infection and was anticoagulated with eliquis.   She reported chest pain 11/2021 and was found to have CAD as noted on coronary CTA 12/13/21 with FFR significant stenosis in the D1 recommended for medical therapy due to advanced age. Moderate nonobstructive disease including 50-69% left main stenosis, LAD, and RCA disease.   She has a history of PAF monitored on ILR which then captured a 25 sec pause but no Afib. She underwent PPM implantation 01/2022 with Dr. Ladona Walker, followed by EP. ILR removed. She was admitted 11/22-12/1/23 with MSSA sepsis complicated by DIC, AKI, and elevated troponin. Sepsis felt related to her PPM and underwent PPM explantation. Recurrent Afib and started on amiodarone.  OAC was deferred at that time given high fall risk.  Elevated troponin in the setting of sepsis, felt not a candidate for LHC given DIC and AKI. Second PPM was not planned unless symptomatic bradycardia. Heart monitor 02/2022 showed no symptomatic bradycardia. She was seen by Dr. Graciela Walker 03/2022 who reduced amiodarone to 100 mg daily.   Second ILR was discussed but not yet placed. Scheduled to be placed this month.  She was last seen in clinic with Dr. Elease Walker 07/23/22. She was felt stable from a HFpEF and CAD standpoint. She did not tolerate  imdur secondary to hypotension.   She presented to [facility] with chest pain radiating to he r back, abdominal pain, and N/V found to have elevated lipase, lactic acid and gallstone pancreatitis. General surgery was consulted and recommended cardiology evaluation prior to planned lap chole for risk evaluation.   CRP 7.2 BNP 366 Lactic 3.5 --> 1.6 HS troponin  x 2 negative Hb 9.2  During my exam, she recounts her epigastric pain. Her daughte is bedside and helps with history. She lives independently at home. She does light cooking and generally uses a rollator outside the home. Recently, they went shopping for about 3 hrs and pt walked longer than normal, no chest pain. She does not generally take nitroglycerin pills. She has not fallen, no syncope. Unclear if she is aware of Afib, but denies recent heart racing or palpitations. Today, she has no cardiac complaints.  Past Medical History:  Diagnosis Date   Bruit    Carotid Doppler showed no significant abnormality     9per patient)   C. difficile colitis 07/2018   with severe sepsis   Chest pain, unspecified    Nuclear, May, 2008, no scar or ischemia   Diabetes mellitus    Diverticulosis    Dyslipidemia    Ejection fraction    EF 55-60%, echo, February, 2011 // Echocardiogram 8/21: EF 60-65, no RWMA, Gr 1 DD, GLS -14%, normal RVSF, mild LAE, trivial MR, mild MS (mean 4 mmHg), RVSP 23.4    GERD (gastroesophageal reflux disease)  Mitral regurgitation 04/21/2009   mild,  echo, February, 2011   Osteoporosis    Palpitations    possible very brief atrial fibrillation on monitor and possible reentrant tachycardia   Psoriasis    Rheumatoid arthritis [facility])     Past Surgical History:  Procedure Laterality Date   ABDOMINAL AORTOGRAM W/LOWER EXTREMITY Right 10/19/2020   Procedure: ABDOMINAL AORTOGRAM W/LOWER EXTREMITY;  Surgeon: Iran Ouch, MD;  Location: [facility] INVASIVE CV LAB;  Service: Cardiovascular;  Laterality: Right;   FRACTURE  SURGERY     LOOP RECORDER REMOVAL N/A 01/05/2022   Procedure: LOOP RECORDER REMOVAL;  Surgeon: Marinus Maw, MD;  Location: [facility] INVASIVE CV LAB;  Service: Cardiovascular;  Laterality: N/A;   PACEMAKER IMPLANT N/A 01/05/2022   Procedure: PACEMAKER IMPLANT;  Surgeon: Marinus Maw, MD;  Location: [facility] INVASIVE CV LAB;  Service: Cardiovascular;  Laterality: N/A;   PPM GENERATOR REMOVAL N/A 01/29/2022   Procedure: PPM GENERATOR REMOVAL;  Surgeon: Marinus Maw, MD;  Location: [facility] INVASIVE CV LAB;  Service: Cardiovascular;  Laterality: N/A;   TEE WITHOUT CARDIOVERSION N/A 01/29/2022   Procedure: TRANSESOPHAGEAL ECHOCARDIOGRAM (TEE);  Surgeon: Sande Rives, MD;  Location: [facility] ENDOSCOPY;  Service: Cardiovascular;  Laterality: N/A;   TRANSMETATARSAL AMPUTATION Right 01/18/2021   Procedure: TRANSMETATARSAL AMPUTATION;  Surgeon: Toni Arthurs, MD;  Location: [facility] OR;  Service: Orthopedics;  Laterality: Right;     Home Medications:  Prior to Admission medications   Medication Sig Start Date End Date Taking? Authorizing Provider  acetaminophen (TYLENOL) 500 MG tablet Take 1,000 mg by mouth daily.   Yes [provider]  amiodarone (PACERONE) 200 MG tablet Take 0.5 tablets (100 mg total) by mouth daily. 04/02/22  Yes Duke Salvia, MD  atorvastatin (LIPITOR) 20 MG tablet Take 1 tablet (20 mg total) by mouth daily. 02/28/22  Yes Nahser, Deloris Ping, MD  bismuth subsalicylate (PEPTO BISMOL) 262 MG/15ML suspension Take 30 mLs by mouth daily at 6 (six) AM.   Yes [provider]  Calcium Carbonate-Vitamin D 600-400 MG-UNIT tablet Take 1 tablet by mouth daily at 12 noon.    Yes [provider]  diclofenac Sodium (VOLTAREN) 1 % GEL Apply 4 g topically 4 (four) times daily. 02/15/22  Yes Setzer, Lynnell Jude, PA-C  gabapentin (NEURONTIN) 100 MG capsule Take 1 capsule (100 mg total) by mouth 3 (three) times daily. Patient taking differently: Take 200 mg by mouth 3 (three) times daily.  02/06/21  Yes Love, Evlyn Kanner, PA-C  insulin aspart (NOVOLOG) 100 UNIT/ML FlexPen Inject 2 Units into the skin 3 (three) times daily with meals. Sliding scale 02/15/22  Yes Setzer, Lynnell Jude, PA-C  Insulin Degludec (TRESIBA) 100 UNIT/ML SOLN Inject 4 Units into the skin at bedtime. 02/15/22  Yes Setzer, Lynnell Jude, PA-C  methocarbamol (ROBAXIN) 500 MG tablet Take 500 mg by mouth 4 (four) times daily.   Yes [provider]  mirtazapine (REMERON) 7.5 MG tablet Take 7.5 mg by mouth at bedtime.   Yes [provider]  Multiple Vitamins-Minerals (CENTRUM SILVER 50+WOMEN PO) Take 1 tablet by mouth daily.   Yes [provider]  nitroGLYCERIN (NITROSTAT) 0.4 MG SL tablet Dissolve 1 tablet under the tongue every 5 minutes as needed for chest pain. Max of 3 doses, then 911. 07/23/22  Yes Nahser, Deloris Ping, MD  predniSONE (DELTASONE) 5 MG tablet Take 1 tablet (5 mg total) by mouth daily with breakfast. 02/15/22  Yes Setzer, Lynnell Jude, PA-C  protein supplement shake (PREMIER  PROTEIN) LIQD Take 2 oz by mouth 2 (two) times daily.   Yes [provider]  pyrithione zinc (SELSUN BLUE DRY SCALP) 1 % shampoo Apply 1 application topically once a week. 04/15/15  Yes [provider]  RESTASIS 0.05 % ophthalmic emulsion Place 1 drop into both eyes 2 (two) times daily. 02/13/19  Yes [provider]    Inpatient Medications: Scheduled Meds:  amiodarone  100 mg Oral Daily   atorvastatin  20 mg Oral Daily   cycloSPORINE  1 drop Both Eyes BID   diclofenac Sodium  4 g Topical TID AC & HS   gabapentin  200 mg Oral TID   heparin  5,000 Units Subcutaneous Q8H   insulin aspart  0-6 Units Subcutaneous Q4H   mirtazapine  7.5 mg Oral QHS   predniSONE  20 mg Oral Q breakfast   sodium chloride flush  3 mL Intravenous Q12H   sodium zirconium cyclosilicate  10 g Oral BID   Continuous Infusions:  lactated ringers 75 mL/hr at 08/08/22 1017   piperacillin-tazobactam (ZOSYN)  IV 3.375 g  (08/08/22 0534)   PRN Meds: acetaminophen **OR** acetaminophen, fentaNYL (SUBLIMAZE) injection, methocarbamol, ondansetron **OR** ondansetron (ZOFRAN) IV  Allergies:    Allergies  Allergen Reactions   Cholestyramine     Possible- Makes throat burn. Patient said she can't take it    Compazine [Prochlorperazine Edisylate] Swelling    REACTION: " tongue swell and unable to swallow"   Celecoxib Diarrhea   Sulfonamide Derivatives Other (See Comments)    REACTION: " broke out with fine itching bumps"   Hydrocodone Other (See Comments)   Infliximab Other (See Comments)    Other reaction(s): rash   Leflunomide Other (See Comments)    Other reaction(s): diarrhea   Prochlorperazine Other (See Comments)    Other reaction(s): Unknown  Other Reaction(s): tongue swells   Rosuvastatin     Other reaction(s): muscle aches   Sulfa Antibiotics Other (See Comments)    Other reaction(s): tongue swelling    Social History:   Social History   Socioeconomic History   Marital status: Widowed    Spouse name: Shelby Walker   Number of children: 2   Years of education: 13   Highest education level: Associate degree: academic program  Occupational History    Employer: RETIRED  Tobacco Use   Smoking status: Never   Smokeless tobacco: Never  Vaping Use   Vaping Use: Never used  Substance and Sexual Activity   Alcohol use: No    Alcohol/week: 0.0 standard drinks of alcohol   Drug use: No   Sexual activity: Yes    Birth control/protection: Post-menopausal  Other Topics Concern   Not on file  Social History Narrative   She is married to [place] and lives with him 2 children   Retired   No alcohol or drugs and never smoker   Social Determinants of Corporate investment banker Strain: Low Risk  (07/24/2018)   Overall Financial Resource Strain (CARDIA)    Difficulty of Paying Living Expenses: Not hard at all  Food Insecurity: No Food Insecurity (08/08/2022)   Hunger Vital Sign    Worried About  Running Out of Food in the Last Year: Never true    Ran Out of Food in the Last Year: Never true  Transportation Needs: No Transportation Needs (08/08/2022)   PRAPARE - Administrator, Civil Service (Medical): No    Lack of Transportation (Non-Medical): No  Physical Activity: Sufficiently Active (  07/24/2018)   Exercise Vital Sign    Days of Exercise per Week: 5 days    Minutes of Exercise per Session: 30 min  Stress: No Stress Concern Present (07/24/2018)   [employer] of Occupational Health - Occupational Stress Questionnaire    Feeling of Stress : Not at all  Social Connections: Socially Integrated (07/24/2018)   Social Connection and Isolation Panel [NHANES]    Frequency of Communication with Friends and Family: More than three times a week    Frequency of Social Gatherings with Friends and Family: More than three times a week    Attends Religious Services: More than 4 times per year    Active Member of [employer] or Organizations: Yes    Attends Engineer, structural: More than 4 times per year    Marital Status: Married  Catering manager Violence: Not At Risk (08/08/2022)   Humiliation, Afraid, Rape, and Kick questionnaire    Fear of Current or Ex-Partner: No    Emotionally Abused: No    Physically Abused: No    Sexually Abused: No    Family History:    Family History  Problem Relation Age of Onset   Heart failure Father    Heart attack Father    Diabetes Father    Heart attack Mother    Heart failure Mother    Diabetes Mother    Stroke Sister      ROS:  Please see the history of present illness.   All other ROS reviewed and negative.     Physical Exam/Data:   Vitals:   08/08/22 0012 08/08/22 0049 08/08/22 0310 08/08/22 0800  BP: 115/66 115/66 (!) 110/26   Pulse: 84 84 90   Resp: 20 20 (!) 24   Temp: 99.8 F (37.7 C) 99.8 F (37.7 C) 99.7 F (37.6 C) 99 F (37.2 C)  TempSrc: Oral Oral Oral Axillary  SpO2: 95%  97%   Weight:  50.8 kg     Height:  5' 1.5" (1.562 m)     No intake or output data in the 24 hours ending 08/08/22 1028    08/08/2022   12:49 AM 07/23/2022   11:04 AM 05/28/2022    3:12 PM  Last 3 Weights  Weight (lbs) 111 lb 15.9 oz 112 lb 12.8 oz 111 lb  Weight (kg) 50.8 kg 51.166 kg 50.349 kg     Body mass index is 20.82 kg/m.  General:  Well nourished, well developed, in no acute distress HEENT: normal Neck: + JVD Vascular: No carotid bruits; Distal pulses 2+ bilaterally Cardiac:  normal S1, S2; RRR; no murmur  Lungs:  clear to auscultation bilaterally, no wheezing, rhonchi or rales  Abd: soft, nontender, no hepatomegaly  Ext: no edema Musculoskeletal:  No deformities, BUE and BLE strength normal and equal Skin: warm and dry  Neuro:  CNs 2-12 intact, no focal abnormalities noted Psych:  Normal affect   EKG:  The EKG was personally reviewed and demonstrates:  sinus rhythm with HR 80 Telemetry:  Telemetry was personally reviewed and demonstrates:  sinus in the 60s  Relevant CV Studies:  Echo 01/25/22: 1. Left ventricular ejection fraction, by estimation, is 50 to 55%. The  left ventricle has low normal function. The left ventricle has no regional  wall motion abnormalities. Left ventricular diastolic parameters are  consistent with Grade II diastolic  dysfunction (pseudonormalization).   2. Right ventricular systolic function is normal. The right ventricular  size is normal. There is normal pulmonary  artery systolic pressure. The  estimated right ventricular systolic pressure is 34.7 mmHg.   3. Left atrial size was moderately dilated.   4. The mitral valve is degenerative. Mild mitral valve regurgitation. The  mean mitral valve gradient is 4.0 mmHg with average heart rate of 77 bpm.   5. The aortic valve is tricuspid. There is mild calcification of the  aortic valve. There is mild thickening of the aortic valve. Aortic valve  regurgitation is not visualized. Aortic valve sclerosis is present, with   no evidence of aortic valve stenosis.   6. The inferior vena cava is dilated in size with <50% respiratory  variability, suggesting right atrial pressure of 15 mmHg.   Laboratory Data:  High Sensitivity Troponin:   Recent Labs  Lab 08/07/22 1710 08/07/22 1848  TROPONINIHS 7 11     Chemistry Recent Labs  Lab 08/07/22 1710 08/08/22 0622  NA 140 136  K 4.2 5.0  CL 107 102  CO2 21* 21*  GLUCOSE 228* 167*  BUN 63* 64*  CREATININE 1.45* 2.00*  CALCIUM 8.5* 7.7*  MG  --  2.0  GFRNONAA 35* 24*  ANIONGAP 12 13    Recent Labs  Lab 08/07/22 1710 08/08/22 0622  PROT 5.8* 5.3*  ALBUMIN 3.1* 2.6*  AST 310* 4,600*  ALT 149* 2,504*  ALKPHOS 59 155*  BILITOT 1.2 2.9*   Lipids No results for input(s): "CHOL", "TRIG", "HDL", "LABVLDL", "LDLCALC", "CHOLHDL" in the last 168 hours.  Hematology Recent Labs  Lab 08/07/22 1710 08/08/22 0622  WBC 9.6 9.3  RBC 3.25* 2.80*  HGB 9.2* 8.1*  HCT 30.0* 25.4*  MCV 92.3 90.7  MCH 28.3 28.9  MCHC 30.7 31.9  RDW 16.1* 16.5*  PLT 194 159   Thyroid No results for input(s): "TSH", "FREET4" in the last 168 hours.  BNP Recent Labs  Lab 08/08/22 0622  BNP 365.7*    DDimer No results for input(s): "DDIMER" in the last 168 hours.   Radiology/Studies:  DG Chest Port 1 View  Result Date: 08/08/2022 CLINICAL DATA:  Shortness of breath EXAM: PORTABLE CHEST 1 VIEW COMPARISON:  Yesterday FINDINGS: Hazy density at the left base, atelectasis or scarring. Streak normal heart size and mediastinal contours. There is no edema, consolidation, effusion, or pneumothorax. Which is IMPRESSION: Mild scarring/atelectasis at the left lung base. No change from prior. Electronically Signed   By: Tiburcio Pea M.D.   On: 08/08/2022 06:42   US Abdomen Limited RUQ (LIVER/GB)  Result Date: 08/07/2022 CLINICAL DATA:  Right upper quadrant pain. EXAM: ULTRASOUND ABDOMEN LIMITED RIGHT UPPER QUADRANT COMPARISON:  Aug 02, 2018 FINDINGS: Gallbladder: Small echogenic  gallstones and/or gallbladder sludge is suspected within the region of the gallbladder fundus. There is no evidence of gallbladder wall thickening (2.5 mm) no sonographic Murphy sign noted by sonographer. Common bile duct: Diameter: 7.1 mm Liver: No focal lesion identified. Within normal limits in parenchymal echogenicity. Portal vein is patent on color Doppler imaging with normal direction of blood flow towards the liver. Other: A 2.6 cm x 1.8 cm x 2.1 cm simple cyst is seen within the upper pole of the right kidney. IMPRESSION: 1. Cholelithiasis and/or gallbladder sludge, without evidence of acute cholecystitis. 2. Simple right renal cyst. Electronically Signed   By: Aram Candela M.D.   On: 08/07/2022 21:17   CT ABDOMEN PELVIS W CONTRAST  Result Date: 08/07/2022 CLINICAL DATA:  Epigastric pain EXAM: CT ABDOMEN AND PELVIS WITH CONTRAST TECHNIQUE: Multidetector CT imaging of the abdomen and  pelvis was performed using the standard protocol following bolus administration of intravenous contrast. RADIATION DOSE REDUCTION: This exam was performed according to the departmental dose-optimization program which includes automated exposure control, adjustment of the mA and/or kV according to patient size and/or use of iterative reconstruction technique. CONTRAST:  50mL OMNIPAQUE IOHEXOL 350 MG/ML SOLN COMPARISON:  01/24/2022 FINDINGS: Lower chest: Lung bases show mild left basilar atelectasis. No focal infiltrate or effusion is seen. Hepatobiliary: Liver is well visualized and within normal limits. Gallbladder is well distended with some dependent density consistent with small gallstones. Wall thickening and likely wall edema is noted. No obstructive changes are seen. Pancreas: Unremarkable. No pancreatic ductal dilatation or surrounding inflammatory changes. Spleen: Normal in size without focal abnormality. Adrenals/Urinary Tract: Adrenal glands are within normal limits. Kidneys are well visualized bilaterally  within normal enhancement pattern. No renal calculi are noted. Simple appearing right renal cyst is noted measuring 2.8 cm. This is similar to that seen on the prior exam. No further follow-up is recommended. Smaller left renal cyst is noted also stable from the prior study. No obstructive changes are seen. The bladder is well distended. Stomach/Bowel: Scattered diverticular change of the colon is noted without evidence of diverticulitis. The appendix is within normal limits. No obstructive changes of the colon are seen. Small bowel and stomach are within normal limits. Vascular/Lymphatic: Aortic atherosclerosis. No enlarged abdominal or pelvic lymph nodes. Reproductive: Uterus and bilateral adnexa are unremarkable. Other: No abdominal wall hernia or abnormality. No abdominopelvic ascites. Musculoskeletal: Mild degenerative changes of lumbar spine are noted with anterolisthesis of L4 on L5. IMPRESSION: Cholelithiasis with evidence of gallbladder wall thickening and likely wall edema. Right upper quadrant ultrasound may be helpful for further evaluation as this may represent acute cholecystitis. Diverticulosis without diverticulitis. Simple renal cysts bilaterally stable from prior exam. No further follow-up is recommended. Electronically Signed   By: Alcide Clever M.D.   On: 08/07/2022 20:33   DG Chest Portable 1 View  Result Date: 08/07/2022 CLINICAL DATA:  Epigastric pain EXAM: PORTABLE CHEST 1 VIEW COMPARISON:  Chest x-ray 01/29/2022 FINDINGS: The heart size and mediastinal contours are within normal limits. Both lungs are clear. There is blunting of the left costophrenic angle. There is no pneumothorax. The visualized skeletal structures are unremarkable. IMPRESSION: Blunting of the left costophrenic angle may be due to pleural thickening or small pleural effusion. Electronically Signed   By: Darliss Cheney M.D.   On: 08/07/2022 18:01     Assessment and Plan:   Paroxysmal atrial fibrillation On 100 mg  amiodarone - telemetry with sinus rhythm - given acute illness, has a high likelihood of converted to Afib  - she is not anticoagulated due to high fall risk - but patient state she was taken off eliquis due to no Afib noted on ILR - if Afib could consider IV amiodarone +/- heparin gtt   Hx of 25-sec pause PPM explanted due to infection Monitor on telemetry during surgery Avoid BB, no pauses on telemetry   CAD CCTA with left main disease - previously not felt a candidate for invasive measures - recently likely completed 4.0 METS without angina, very encouraging   Preoperative risk evaluation for MACE prior to cholecystectomy She has known CAD and requires insulin. She has am 6.6% risk of MACE, no point given for HFpEF as she does not require standing diuretic and has no recent hospitalizations for CHF exacerbation. I think her biggest risk factors are left main stenosis not completely characterized by angiography,  sick sinus syndrome given history of prolonged pause, and conversion to Afib with RVR. She appears euvolemic on exam and recently shopped and likely achieved 4.0 METS without angina. I do not think she has any unstable cardiac issues. She and her daughter understand she is at least moderate risk for a low risk procedure. They agree to proceed. Cardiology will follow along with you.     Risk Assessment/Risk Scores:       For questions or updates, please contact [facility] Please consult [email or web address] for contact info under    Signed, Marcelino Duster, [place]  08/08/2022 10:28 AM   Patient seen and examined. Agree with assessment and plan.  Shelby Walker is a very pleasant 89 year old female who has known CAD noted on coronary CTA in October 2023 which demonstrated 50 to 69% left main stenosis, LAD and RCA disease.  At that time, it was recommended that she undergo medical therapy.  She has done well and denies any anginal type symptomatology.  She has a history of  PAF and when wearing a loop recorder was found to have a prolonged 25-second pause without AF leading to permanent pacemaker implantation in November 2023.  Unfortunately, she subsequently developed complicated by DIC, AKA, elevated troponin and permanent pacemaker was explanted on January 29, 2022.  Recurrent A-fib, she was started on amiodarone therapy apparently has been maintaining sinus rhythm.  She is not on anticoagulation secondary to high fall risk.  There was discussion by Dr. Graciela Walker that she may undergo future loop recorder for reassessment of her tach rhythm and make certain another pacemaker was not needed.  She is followed by an echo Doppler study has shown an EF of 50 to 55% with mild MR, and mild aortic sclerosis.  She also has aortic plaque.  She was currently admitted with increasing abdominal pain radiating to her back, nausea, vomiting, elevated lipase, lactic acid, and gallstone pancreatitis.  Liver transaminases are markedly elevated with AST 4600, ALT 2000 504, and lipase 2876.  She been evaluated by [facility] plans to undergo upper scopic cholecystectomy with intraoperative cholangiogram.  Presently, blood pressure is stable.  Cardiac rhythm is regular upper 80s without ectopy.  There is no AF.  HEENT is unremarkable.  She has slightly decreased breath sounds at her bases.  There is no JVD.  Rhythm is regular 1/6 systolic murmur in the aortic area as well as at the axilla.  There is no S3 gallop.  She has tenderness to the epigastric region to palpation.  Bowel sounds are positive.  There is no significant edema or cyanosis.  Her ECG shows sinus rhythm at 80.  There is no ectopy.  There were no significant ST abnormalities.  She has borderline QTc prolongation at 471 ms.  I long discussion with the patient as well as her daughter.  It is strongly recommended that she undergo cholecystectomy.  Although she does have significant cardiac issues, clinically she is stable.  Cardiac rhythm  shows sinus rhythm in the 80s.  She is not having anginal symptomatology.  She does not have significant LV dysfunction EF at 50 to 55% and no significant valvular abnormality.  Recommend continued cardiac monitoring and postoperative ECG.  We have given clearance for her to undergo planned surgery which I understand and is tentatively scheduled for tomorrow.   Lennette Bihari, MD, [facility] 08/08/2022 12:58 PM

## 2022-08-08 NOTE — Evaluation (Signed)
Physical Therapy Evaluation Patient Details Name: Shelby Walker MRN: [id number] DOB: [birth year] Today's Date: 08/08/2022  History of Present Illness  89 y/o F admitted to [facility] on 6/4 for epigastric pain, nausea, vomiting, and shaking chills. CT of abdomen shows cholelithiasis with evidence of gallbladder wall thickening, RUQ ultrasound without evidence of acute cholecystitis but gallbladder sludge and cholelithiasis. Plans for lap chole 6/6. PMHx: RA, DM, CKD 3B, mitral regurgitation, PAF, pacemaker implantation removed due to MSSA bacteremia, recurrent c diff  Clinical Impression  Pt presents today with impaired functional mobility, limited by strength, balance, and activity tolerance. At baseline, pt is modI with mobility and use of RW, denying any recent falls. Pt tolerated today's session well but requires minG-minA for all mobility with use of RW. Ambulation limited today due to generalized fatigue of pt as well as urinary and bowel incontinence, transferring to/from [facility] and increased time in standing for clean-up. Pt requires minG-minA for standing balance with RW for balance, as RW progressively getting further from pt with static standing, requiring cueing to correct. Pt has good family support, recommend return home with HHPT and continued family support, acute PT will continue to follow up with pt during admission to progress mobility and address deficits.      Recommendations for follow up therapy are one component of a multi-disciplinary discharge planning process, led by the attending physician.  Recommendations may be updated based on patient status, additional functional criteria and insurance authorization.  Follow Up Recommendations       Assistance Recommended at Discharge Frequent or constant Supervision/Assistance  Patient can return home with the following  A little help with walking and/or transfers;Help with stairs or ramp for entrance;Assist for transportation;Direct  supervision/assist for medications management;Assistance with cooking/housework;A little help with bathing/dressing/bathroom    Equipment Recommendations None recommended by PT  Recommendations for Other Services       Functional Status Assessment Patient has had a recent decline in their functional status and demonstrates the ability to make significant improvements in function in a reasonable and predictable amount of time.     Precautions / Restrictions Precautions Precautions: Fall;Other (comment) Precaution Comments: watch O2, urinary and bowel incontinence Restrictions Weight Bearing Restrictions: No      Mobility  Bed Mobility Overal bed mobility: Needs Assistance Bed Mobility: Supine to Sit, Sit to Supine     Supine to sit: Min guard, HOB elevated Sit to supine: Min guard, HOB elevated   General bed mobility comments: increased time and use of bed rail, mild cueing for proper technique    Transfers Overall transfer level: Needs assistance Equipment used: Rolling walker (2 wheels) Transfers: Sit to/from Stand, Bed to chair/wheelchair/BSC Sit to Stand: Min assist, Min guard Stand pivot transfers: Min assist         General transfer comment: minA progressing to minG with sit<>stand transfers. Stand pivot to/from [facility] with minA for balance and safety, mild cueing for proper technique    Ambulation/Gait Ambulation/Gait assistance: Min guard Gait Distance (Feet): 3 Feet Assistive device: Rolling walker (2 wheels) Gait Pattern/deviations: Step-to pattern, Decreased stride length, Trunk flexed Gait velocity: decreased     General Gait Details: side stepping along EOB, distance limited by urinary incontinence with standing. MinG provided for balance.  Stairs            Wheelchair Mobility    Modified Rankin (Stroke Patients Only)       Balance Overall balance assessment: Needs assistance Sitting-balance support: No upper extremity supported, Feet  supported Sitting balance-Leahy Scale: Fair     Standing balance support: Bilateral upper extremity supported, During functional activity, Reliant on assistive device for balance Standing balance-Leahy Scale: Poor Standing balance comment: reliant on RW for transfers. with static standing RW increasing in distance from pt, requiring cues to maintain RW close                             Pertinent Vitals/Pain Pain Assessment Pain Assessment: Faces Faces Pain Scale: No hurt    Home Living Family/patient expects to be discharged to:: Private residence Living Arrangements: Alone Available Help at Discharge: Family;Available PRN/intermittently;Personal care attendant (aide helps with cleaning and assisting around the house 1x/week) Type of Home: House Home Access: Stairs to enter Entrance Stairs-Rails: Left (grab bar) Entrance Stairs-Number of Steps: 2   Home Layout: One level;Laundry or work area in [employer] Equipment: Agricultural consultant (2 wheels);Shower seat;Grab bars - tub/shower;Hand held Stage manager (4 wheels);BSC/3in1;Other (comment) (lift chair) Additional Comments: family lives a few houses down and are available to assist if needed. Pt has stair lift to the basement if she needs to access it    Prior Function Prior Level of Function : Needs assist             Mobility Comments: modI with RW, denies any falls ADLs Comments: daughter assists with iADLs and family brings meals that she reheats, family provides transportation     Hand Dominance   Dominant Hand: Right    Extremity/Trunk Assessment   Upper Extremity Assessment Upper Extremity Assessment: Defer to OT evaluation    Lower Extremity Assessment Lower Extremity Assessment: Generalized weakness (R TMA)    Cervical / Trunk Assessment Cervical / Trunk Assessment: Normal  Communication   Communication: HOH (has hearing aids but not present during evaluation)  Cognition  Arousal/Alertness: Awake/alert Behavior During Therapy: WFL for tasks assessed/performed Overall Cognitive Status: Within Functional Limits for tasks assessed                                 General Comments: A&Ox4 except stating birth year as [birth year] instead of 1935, pleasant throughout session, daughter at bedside without concern for cognition, reports noted a decrease after sepsis in November        General Comments General comments (skin integrity, edema, etc.): SPO2 in 80s upon arrival while pt sleeping, once pt awoken, maintaining 89% or greater on room air with mobility    Exercises     Assessment/Plan    PT Assessment Patient needs continued PT services  PT Problem List Decreased strength;Decreased activity tolerance;Decreased balance;Decreased mobility;Decreased safety awareness;Decreased knowledge of precautions;Cardiopulmonary status limiting activity       PT Treatment Interventions DME instruction;Gait training;Stair training;Functional mobility training;Therapeutic activities;Therapeutic exercise;Balance training;Neuromuscular re-education;Patient/family education    PT Goals (Current goals can be found in the Care Plan section)  Acute Rehab PT Goals Patient Stated Goal: get better and go home PT Goal Formulation: With patient/family Time For Goal Achievement: 08/22/22 Potential to Achieve Goals: Good    Frequency Min 3X/week     Co-evaluation               AM-PAC PT "6 Clicks" Mobility  Outcome Measure Help needed turning from your back to your side while in a flat bed without using bedrails?: A Little Help needed moving from lying on your back to sitting on the side  of a flat bed without using bedrails?: A Little Help needed moving to and from a bed to a chair (including a wheelchair)?: A Little Help needed standing up from a chair using your arms (e.g., wheelchair or bedside chair)?: A Little Help needed to walk in hospital room?: A  Lot Help needed climbing 3-5 steps with a railing? : Total 6 Click Score: 15    End of Session Equipment Utilized During Treatment: Gait belt Activity Tolerance: Patient tolerated treatment well Patient left: in bed;with call bell/phone within reach;with bed alarm set;with family/visitor present Nurse Communication: Mobility status PT Visit Diagnosis: Unsteadiness on feet (R26.81);Muscle weakness (generalized) (M62.81);Other abnormalities of gait and mobility (R26.89)    Time: 1610-9604 PT Time Calculation (min) (ACUTE ONLY): 53 min   Charges:   PT Evaluation $PT Eval Moderate Complexity: 1 Mod PT Treatments $Therapeutic Activity: 8-22 mins        Shelby Walker, PT DPT Acute Rehabilitation Services Office [phone]   Shelby Walker 08/08/2022, 4:32 PM

## 2022-08-08 NOTE — Evaluation (Signed)
Occupational Therapy Evaluation Patient Details Name: Shelby Walker MRN: [id number] DOB: 1934/01/16 Today's Date: 08/08/2022   History of Present Illness 89 y/o F admitted to [facility] on 6/4 for epigastric pain, nausea, vomiting, and shaking chills. CT of abdomen shows cholelithiasis with evidence of gallbladder wall thickening, RUQ ultrasound without evidence of acute cholecystitis but gallbladder sludge and cholelithiasis. Plans for lap chole 6/6. PMHx: RA, DM, CKD 3B, mitral regurgitation, PAF, pacemaker implantation removed due to MSSA bacteremia, recurrent c diff   Clinical Impression   At baseline, pt completes ADLs Independent to Mod I and performs functional transfers/mobility with Mod I with RW. Pt's family assists with meal prep, home management tasks, and transportation. Pt also has a paid caregiver 1x/week to assist with home management tasks. Pt now presents with decreased activity tolerance, decreased sitting/standing balance during functional tasks, generalized B UE weakness, and decreased safety and independence with ADLs, functional transfers, and functional mobility. Pt currently completes UB ADLs Independent to Min assist, LB ADLs with Min to Max assist, and functional transfers/mobility with a RW with Min guard to Min assist with occasional cues for safety and technique. Pt will benefit from acute skilled OT services to address deficits outlined below and to increase safety and independence with ADLs and functional transfers/mobility. Post acute discharge, pt will benefit from continued skilled OT services in the home and continued family support.      Recommendations for follow up therapy are one component of a multi-disciplinary discharge planning process, led by the attending physician.  Recommendations may be updated based on patient status, additional functional criteria and insurance authorization.   Assistance Recommended at Discharge Frequent or constant Supervision/Assistance   Patient can return home with the following A little help with walking and/or transfers;A lot of help with bathing/dressing/bathroom;Assistance with cooking/housework;Assist for transportation;Help with stairs or ramp for entrance    Functional Status Assessment  Patient has had a recent decline in their functional status and demonstrates the ability to make significant improvements in function in a reasonable and predictable amount of time.  Equipment Recommendations  None recommended by OT    Recommendations for Other Services       Precautions / Restrictions Precautions Precautions: Fall;Other (comment) Precaution Comments: watch O2, urinary and bowel incontinence Restrictions Weight Bearing Restrictions: No      Mobility Bed Mobility Overal bed mobility: Needs Assistance Bed Mobility: Supine to Sit, Sit to Supine     Supine to sit: Min guard, HOB elevated Sit to supine: Min guard, HOB elevated   General bed mobility comments: increased time and use of bed rail, mild cueing for proper technique    Transfers Overall transfer level: Needs assistance Equipment used: Rolling walker (2 wheels) Transfers: Sit to/from Stand, Bed to chair/wheelchair/BSC Sit to Stand: Min assist, Min guard Stand pivot transfers: Min assist         General transfer comment: minA progressing to minG with sit<>stand transfers. Stand pivot to/from [facility] with minA for balance and safety, mild cueing for proper technique      Balance Overall balance assessment: Needs assistance Sitting-balance support: No upper extremity supported, Feet supported Sitting balance-Leahy Scale: Fair     Standing balance support: Bilateral upper extremity supported, During functional activity, Reliant on assistive device for balance Standing balance-Leahy Scale: Poor Standing balance comment: Pt reliant on RW for balance in standing and transfers. In static standing RW increasing in distance from pt with pt  requiring cues to maintain RW close.  ADL either performed or assessed with clinical judgement   ADL Overall ADL's : Needs assistance/impaired Eating/Feeding: Independent;Sitting   Grooming: Modified independent;Sitting   Upper Body Bathing: Minimal assistance;Sitting   Lower Body Bathing: Maximal assistance;Sit to/from stand (Largely Min assist in sitting with Max assist needed for peri care in standing)   Upper Body Dressing : Min guard;Sitting   Lower Body Dressing: Minimal assistance;Sit to/from stand   Toilet Transfer: Minimal assistance;Stand-pivot;BSC/3in1;Rolling walker (2 wheels) (Cues for technique)   Toileting- Clothing Manipulation and Hygiene: Maximal assistance;Sit to/from stand       Functional mobility during ADLs: Min guard;Cueing for safety;Rolling walker (2 wheels)       Vision Baseline Vision/History: 1 Wears glasses (readers, not present during OT eval) Ability to See in Adequate Light: 0 Adequate Patient Visual Report: No change from baseline       Perception     Praxis Praxis Praxis tested?: Within functional limits    Pertinent Vitals/Pain Pain Assessment Pain Assessment: No/denies pain     Hand Dominance Right   Extremity/Trunk Assessment Upper Extremity Assessment Upper Extremity Assessment: Generalized weakness   Lower Extremity Assessment Lower Extremity Assessment: Defer to PT evaluation   Cervical / Trunk Assessment Cervical / Trunk Assessment: Normal   Communication Communication Communication: HOH (weare hearing aids, but were not present during OT eval)   Cognition Arousal/Alertness: Awake/alert Behavior During Therapy: WFL for tasks assessed/performed Overall Cognitive Status: Within Functional Limits for tasks assessed                                 General Comments: [id number], Pt pleasant throughout session. Daughter at bedside throughout session without concern for  cognition. Duaghter reports noted a decrease in cognition after sepsis in November and states cognition has improved since that time though pt is not back to previous baseline.     General Comments  Pt O2 sat in mid to upper 80s while sleeping upon OT arrival. Once awake, pt O2 sat at or >89% on RA throughout session. Pt's daughter present throughout session.    Exercises     Shoulder Instructions      Home Living Family/patient expects to be discharged to:: Private residence Living Arrangements: Alone Available Help at Discharge: Family;Available PRN/intermittently;Personal care attendant (Aide assists with home management tasks 1x/week) Type of Home: House Home Access: Stairs to enter [employer] of Steps: 2 Entrance Stairs-Rails: Left (grab bar) Home Layout: One level;Laundry or work area in basement     [employer]: Producer, television/film/video: Handicapped height Bathroom Accessibility: Yes How Accessible: Accessible via walker Home Equipment: Agricultural consultant (2 wheels);Shower seat;Grab bars - tub/shower;Hand held Stage manager (4 wheels);BSC/3in1;Other (comment) (lift chair)   Additional Comments: family lives a few houses down and are available to assist if needed. Pt has stair lift to the basement if she needs to access it      Prior Functioning/Environment Prior Level of Function : Needs assist             Mobility Comments: Pt previously Mod I with RW, denies any falls ADLs Comments: Pt Independent to Mod I with ADLs at baseline. Pt's daughter assists with IADLs and family brings meals that she reheats in the microwave, family provides transportation        OT Problem List: Decreased strength;Decreased activity tolerance;Impaired balance (sitting and/or standing);Decreased knowledge of use of DME or AE  OT Treatment/Interventions: Self-care/ADL training;Therapeutic exercise;Energy conservation;DME and/or AE  instruction;Therapeutic activities;Patient/family education;Balance training    OT Goals(Current goals can be found in the care plan section) Acute Rehab OT Goals Patient Stated Goal: To return home and be independent OT Goal Formulation: With patient/family Time For Goal Achievement: 08/22/22 Potential to Achieve Goals: Good ADL Goals Pt Will Perform Grooming: with supervision;standing Pt Will Perform Upper Body Bathing: with supervision;sitting Pt Will Perform Lower Body Bathing: with min guard assist;sit to/from stand Pt Will Transfer to Toilet: with modified independence;ambulating;regular height toilet (with least restrictive AD) Pt Will Perform Toileting - Clothing Manipulation and hygiene: with min guard assist;sit to/from stand  OT Frequency: Min 2X/week    Co-evaluation              AM-PAC OT "6 Clicks" Daily Activity     Outcome Measure Help from another person eating meals?: None Help from another person taking care of personal grooming?: A Little Help from another person toileting, which includes using toliet, bedpan, or urinal?: A Lot Help from another person bathing (including washing, rinsing, drying)?: A Lot Help from another person to put on and taking off regular upper body clothing?: A Little Help from another person to put on and taking off regular lower body clothing?: A Little 6 Click Score: 17   End of Session Equipment Utilized During Treatment: Gait belt;Rolling walker (2 wheels) Nurse Communication: Mobility status;Other (comment) (O2 sat)  Activity Tolerance: Patient tolerated treatment well Patient left: in bed;with call bell/phone within reach;with bed alarm set;with family/visitor present  OT Visit Diagnosis: Unsteadiness on feet (R26.81);Muscle weakness (generalized) (M62.81)                Time: 1610-9604 OT Time Calculation (min): 51 min Charges:  OT General Charges $OT Visit: 1 Visit OT Evaluation $OT Eval Low Complexity: 1 Low OT  Treatments $Self Care/Home Management : 8-22 mins  Disney Ruggiero "Orson Eva., OTR/L, MA Acute Rehab [phone]   Lendon Colonel 08/08/2022, 6:07 PM

## 2022-08-08 NOTE — Progress Notes (Signed)
Progress Note: General Surgery Service   Chief Complaint/Subjective: Denies abdominal pain.  Chest pain feeling better.  Objective: Vital signs in last 24 hours: Temp:  [97.8 F (36.6 C)-102 F (38.9 C)] 99.7 F (37.6 C) (06/05 0310) Pulse Rate:  [80-101] 90 (06/05 0310) Resp:  [15-24] 24 (06/05 0310) BP: (94-142)/(26-97) 110/26 (06/05 0310) SpO2:  [95 %-100 %] 97 % (06/05 0310) Weight:  [50.8 kg] 50.8 kg (06/05 0049)    Intake/Output from previous day: No intake/output data recorded. Intake/Output this shift: No intake/output data recorded.  GI: Abd Soft, nontender; no palpable hepatosplenomegaly  Lab Results: CBC  Recent Labs    08/07/22 1710 08/08/22 0622  WBC 9.6 9.3  HGB 9.2* 8.1*  HCT 30.0* 25.4*  PLT 194 159   BMET Recent Labs    08/07/22 1710  NA 140  K 4.2  CL 107  CO2 21*  GLUCOSE 228*  BUN 63*  CREATININE 1.45*  CALCIUM 8.5*   PT/INR No results for input(s): "LABPROT", "INR" in the last 72 hours. ABG No results for input(s): "PHART", "HCO3" in the last 72 hours.  Invalid input(s): "PCO2", "PO2"  Anti-infectives: Anti-infectives (From admission, onward)    Start     Dose/Rate Route Frequency Ordered Stop   08/08/22 0500  piperacillin-tazobactam (ZOSYN) IVPB 3.375 g        3.375 g 12.5 mL/hr over 240 Minutes Intravenous Every 8 hours 08/07/22 2230     08/07/22 2030  piperacillin-tazobactam (ZOSYN) IVPB 3.375 g        3.375 g 100 mL/hr over 30 Minutes Intravenous  Once 08/07/22 2029 08/07/22 2125       Medications: Scheduled Meds:  amiodarone  100 mg Oral Daily   atorvastatin  20 mg Oral Daily   cycloSPORINE  1 drop Both Eyes BID   diclofenac Sodium  4 g Topical TID AC & HS   gabapentin  200 mg Oral TID   heparin  5,000 Units Subcutaneous Q8H   insulin aspart  0-6 Units Subcutaneous Q4H   mirtazapine  7.5 mg Oral QHS   predniSONE  20 mg Oral Q breakfast   sodium chloride flush  3 mL Intravenous Q12H   Continuous Infusions:   lactated ringers     piperacillin-tazobactam (ZOSYN)  IV 3.375 g (08/08/22 0534)   PRN Meds:.acetaminophen **OR** acetaminophen, fentaNYL (SUBLIMAZE) injection, methocarbamol, ondansetron **OR** ondansetron (ZOFRAN) IV  Assessment/Plan: Ms. Mcduffey is a 89 year old female who presented with gallstone pancreatitis.  I explained the ~50 % risk of recurrent pancreatitis without cholecystectomy.  She stated she would rather have her gallbladder removed than deal with that pain again.  I recommend laparoscopic cholecystectomy with intraoperative cholangiogram.  I would like her to have one more day of bowel rest for the pancreas to recover prior to surgery.  I would like to ask the medical team to provide a preoperative risk evaluation and optimization prior to surgery, and also recommendations for perioperative anticoagulation.  The medical team may decide to involve cardiology in this evaluation.  Once their evaluation is complete, I will have another discussion with the patient of the risks and benefits of surgery, and if she is agreeable we may be able to take her to surgery tomorrow.   LOS: 1 day   FEN: NPO for bowel rest due to pancreatitis ID: None needed from surgery perspective, on zosyn VTE: Heparin, scds Foley: None Dispo: Continued care on floor    Quentin Ore, MD  [facility] Use [email or web address] to contact on call provider  Daily Billing: [id number] - High MDM

## 2022-08-08 NOTE — Progress Notes (Signed)
Pharmacy Antibiotic Note  Shelby Walker is a 89 y.o. female admitted on 08/07/2022 with  IAI, CT Ab showing acute cholecystitis .  Pharmacy has been consulted for Zosyn dosing. Calculated CrCL 99mL/min using historical weight.   Blood cultures are growing out GNR. Plan for cholecystectomy. Scr increase to 2 today. We will reduce dose of zosyn.   Plan: Change Zosyn to 2.25g IV q6 Monitor renal function for dose adjustments as indicated.  Height: 5' 1.5" (156.2 cm) Weight: 50.8 kg (111 lb 15.9 oz) IBW/kg (Calculated) : 48.95  Temp (24hrs), Avg:99.9 F (37.7 C), Min:97.8 F (36.6 C), Max:102 F (38.9 C)  Recent Labs  Lab 08/07/22 1710 08/07/22 1915 08/07/22 2129 08/08/22 0622  WBC 9.6  --   --  9.3  CREATININE 1.45*  --   --  2.00*  LATICACIDVEN  --  3.5* 3.4* 1.6     Estimated Creatinine Clearance: 15.3 mL/min (A) (by C-G formula based on SCr of 2 mg/dL (H)).    Allergies  Allergen Reactions   Cholestyramine     Possible- Makes throat burn. Patient said she can't take it    Compazine [Prochlorperazine Edisylate] Swelling    REACTION: " tongue swell and unable to swallow"   Celecoxib Diarrhea   Sulfonamide Derivatives Other (See Comments)    REACTION: " broke out with fine itching bumps"   Hydrocodone Other (See Comments)   Infliximab Other (See Comments)    Other reaction(s): rash   Leflunomide Other (See Comments)    Other reaction(s): diarrhea   Prochlorperazine Other (See Comments)    Other reaction(s): Unknown  Other Reaction(s): tongue swells   Rosuvastatin     Other reaction(s): muscle aches   Sulfa Antibiotics Other (See Comments)    Other reaction(s): tongue swelling   6/4 zosyn>>   6/4 blood>>GNR 2/4  Ulyses Southward, PharmD, [place], AAHIVP, CPP Infectious Disease Pharmacist 08/08/2022 2:20 PM

## 2022-08-08 NOTE — Progress Notes (Signed)
PHARMACY - PHYSICIAN COMMUNICATION CRITICAL VALUE ALERT - BLOOD CULTURE IDENTIFICATION (BCID)  Shelby Walker is an 89 y.o. female who presented to [facility] on 08/07/2022 with a chief complaint of epigastric pain, N/V, shaking chills  Assessment: 89 YOF found to have gallstone pancreatitis and ascending cholangitis. Surgery recommending cholecystectomy. Now with 2/4 bottles growing GNR with BCID detecting Enterobacter cloacae  Name of physician (or Provider) Contacted: Thedore Mins  Current antibiotics: Zosyn  Changes to prescribed antibiotics recommended:  D/c Zosyn and start Cefepime 2g IV every 24 hours MD wants to keep anaerobic coverage - will add Flagyl 500 mg IV bid  Results for orders placed or performed during the hospital encounter of 08/07/22  Blood Culture ID Panel (Reflexed) (Collected: 08/07/2022  7:15 PM)  Result Value Ref Range   Enterococcus faecalis NOT DETECTED NOT DETECTED   Enterococcus Faecium NOT DETECTED NOT DETECTED   Listeria monocytogenes NOT DETECTED NOT DETECTED   Staphylococcus species NOT DETECTED NOT DETECTED   Staphylococcus aureus (BCID) NOT DETECTED NOT DETECTED   Staphylococcus epidermidis NOT DETECTED NOT DETECTED   Staphylococcus lugdunensis NOT DETECTED NOT DETECTED   Streptococcus species NOT DETECTED NOT DETECTED   Streptococcus agalactiae NOT DETECTED NOT DETECTED   Streptococcus pneumoniae NOT DETECTED NOT DETECTED   Streptococcus pyogenes NOT DETECTED NOT DETECTED   A.calcoaceticus-baumannii NOT DETECTED NOT DETECTED   Bacteroides fragilis NOT DETECTED NOT DETECTED   Enterobacterales DETECTED (A) NOT DETECTED   Enterobacter cloacae complex DETECTED (A) NOT DETECTED   Escherichia coli NOT DETECTED NOT DETECTED   Klebsiella aerogenes NOT DETECTED NOT DETECTED   Klebsiella oxytoca NOT DETECTED NOT DETECTED   Klebsiella pneumoniae NOT DETECTED NOT DETECTED   Proteus species NOT DETECTED NOT DETECTED   Salmonella species NOT DETECTED NOT DETECTED    Serratia marcescens NOT DETECTED NOT DETECTED   Haemophilus influenzae NOT DETECTED NOT DETECTED   Neisseria meningitidis NOT DETECTED NOT DETECTED   Pseudomonas aeruginosa NOT DETECTED NOT DETECTED   Stenotrophomonas maltophilia NOT DETECTED NOT DETECTED   Candida albicans NOT DETECTED NOT DETECTED   Candida auris NOT DETECTED NOT DETECTED   Candida glabrata NOT DETECTED NOT DETECTED   Candida krusei NOT DETECTED NOT DETECTED   Candida parapsilosis NOT DETECTED NOT DETECTED   Candida tropicalis NOT DETECTED NOT DETECTED   Cryptococcus neoformans/gattii NOT DETECTED NOT DETECTED   CTX-M ESBL NOT DETECTED NOT DETECTED   Carbapenem resistance IMP NOT DETECTED NOT DETECTED   Carbapenem resistance KPC NOT DETECTED NOT DETECTED   Carbapenem resistance NDM NOT DETECTED NOT DETECTED   Carbapenem resist OXA 48 LIKE NOT DETECTED NOT DETECTED   Carbapenem resistance VIM NOT DETECTED NOT DETECTED    Thank you for allowing pharmacy to be a part of this patient's care.  Georgina Pillion, PharmD, BCPS Infectious Diseases Clinical Pharmacist 08/08/2022 3:02 PM   **Pharmacist phone directory can now be found on [email or web address] ([id number]).  Listed under [facility].

## 2022-08-08 NOTE — Telephone Encounter (Signed)
Inbound call from patient daughter requesting to speak with a nurse in regards up coming procedure her mom has possibly tomorrow.Please advise.

## 2022-08-08 NOTE — Progress Notes (Signed)
PROGRESS NOTE                                                                                                                                                                                                             Patient Demographics:    Shelby Walker, is a 89 y.o. female, DOB - June 21, 1933, WUJ:[id number]  Outpatient Primary MD for the patient is Adrian Prince, MD    LOS - 1  Admit date - 08/07/2022    Chief Complaint  Patient presents with   Chest Pain       Brief Narrative (HPI from H&P)    89 y.o. 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  Her workup was consistent with gallstone pancreatitis with possible ascending cholangitis, general surgery was consulted and she was admitted to the hospital.   Subjective:    Shelby Walker today has, No headache, No chest pain, improved abdominal pain - No Nausea, No new weakness tingling or numbness, no shortness of breath.   Assessment  & Plan :   1. Acute biliary pancreatitis -with possible ascending cholangitis and sepsis.  She is extremely tenuous, general surgery on board, GI cardiology also on board.  She has been kept on bowel rest, IV fluids, bolus as needed, stress dose steroids as she is chronically on steroids for rheumatoid arthritis, empiric IV antibiotics.  She is a high risk candidate for any operative procedure, she, her daughter and son completely understand this.  They accept the risk and would like to proceed with any surgical procedure that is required.  Patient is agreeable for blood transfusion if needed during perioperative period, okay for  short-term ventilator if needed in the OR or PACU.   2. Sepsis   - Fever with rigors, mild tachycardia, SBP as low as 94, and lactate 3.5 in ED, ascending cholangitis follow cultures, IV fluids and IV Zosyn.  General surgery GI on board.  Sepsis pathophysiology is improving.    3. Type II DM  - A1c was 7.3% in November 2023, Check CBGs and use low-intensity SSI for now  CBG (last 3)  Recent Labs    08/07/22 2318 08/08/22 0834  GLUCAP 177* 144*     4.  AKI on CKD 3B with borderline hyperkalemia. - SCr is 1.45 on admission, hydrate, renal ultrasound, Foley catheter if needed, Lokelma and monitor.    5. PAF  - No longer anticoagulated d/t fall risk, Continue amiodarone, recent echocardiogram from few months ago noted with preserved EF, EKG here stable.  No chest pain at rest, cardiology to also evaluate, her heart conditions should not prohibit her from going to surgery.   6. Rheumatoid arthritis  - Managed with prednisone 5 mg daily placed with stress dose steroids.         Condition - Extremely Guarded  Family Communication  : Both daughter and son updated in detail on 08/08/2022, they will understand that patient is extremely tenuous, she will require cholecystectomy, they understand that she is a high risk candidate for any surgical procedure.  They understand this risk and want to proceed with surgery.  Code Status : DNR  Consults  : General surgery, GI, cardiology  PUD Prophylaxis : PPI   Procedures  :     Recent echocardiogram in November 2023.  -      1. Left ventricular ejection fraction, by estimation, is 50 to 55%. The left ventricle has low normal function.   2. Right ventricular systolic function is normal. The right ventricular size is normal. There is normal pulmonary artery systolic pressure.   3. Left atrial size was moderately dilated. No left atrial/left atrial appendage thrombus was detected.   4. The mitral valve is degenerative. Mild mitral valve  regurgitation. No evidence of mitral stenosis.   5. The aortic valve is tricuspid. There is mild calcification of the aortic valve. There is mild thickening of the aortic valve. Aortic valve regurgitation is not visualized. Aortic valve sclerosis is present, with no evidence of aortic valve stenosis.   6. There is mild (Grade II) layered plaque involving the descending aorta and aortic arch.   EKG NSR, no acute ST changes.    RUQ [place] - 1. Cholelithiasis and/or gallbladder sludge, without evidence of acute cholecystitis. 2. Simple right renal cyst  CT -  Cholelithiasis with evidence of gallbladder wall thickening and likely wall edema. Right upper quadrant ultrasound may be helpful for further evaluation as this may represent acute cholecystitis. Diverticulosis without diverticulitis. Simple renal cysts bilaterally stable from prior exam. No further follow-up is recommended      Disposition Plan  :    Status is: Inpatient   DVT Prophylaxis  :    heparin injection 5,000 Units Start: 08/07/22 2215    Lab Results  Component Value Date   PLT 159 08/08/2022    Diet :  Diet Order             Diet NPO time specified Except for: Sips with Meds, Ice Chips  Diet effective now                    Inpatient Medications  Scheduled Meds:  amiodarone  100 mg Oral Daily   atorvastatin  20 mg Oral Daily   cycloSPORINE  1 drop Both Eyes BID   diclofenac Sodium  4 g Topical TID AC & HS   gabapentin  200 mg Oral TID   heparin  5,000 Units Subcutaneous Q8H   hydrocortisone sod succinate (SOLU-CORTEF) inj  100 mg Intravenous Q12H   insulin aspart  0-6 Units Subcutaneous  Q4H   mirtazapine  7.5 mg Oral QHS   pantoprazole  40 mg Oral Daily   sodium chloride flush  3 mL Intravenous Q12H   sodium zirconium cyclosilicate  10 g Oral BID   Continuous Infusions:  lactated ringers 75 mL/hr at 08/08/22 1017   piperacillin-tazobactam (ZOSYN)  IV 3.375 g (08/08/22 0534)   PRN  Meds:.acetaminophen **OR** acetaminophen, fentaNYL (SUBLIMAZE) injection, methocarbamol, ondansetron **OR** ondansetron (ZOFRAN) IV  Antibiotics  :    Anti-infectives (From admission, onward)    Start     Dose/Rate Route Frequency Ordered Stop   08/08/22 0500  piperacillin-tazobactam (ZOSYN) IVPB 3.375 g        3.375 g 12.5 mL/hr over 240 Minutes Intravenous Every 8 hours 08/07/22 2230     08/07/22 2030  piperacillin-tazobactam (ZOSYN) IVPB 3.375 g        3.375 g 100 mL/hr over 30 Minutes Intravenous  Once 08/07/22 2029 08/07/22 2125         Objective:   Vitals:   08/08/22 0012 08/08/22 0049 08/08/22 0310 08/08/22 0800  BP: 115/66 115/66 (!) 110/26   Pulse: 84 84 90   Resp: 20 20 (!) 24   Temp: 99.8 F (37.7 C) 99.8 F (37.7 C) 99.7 F (37.6 C) 99 F (37.2 C)  TempSrc: Oral Oral Oral Axillary  SpO2: 95%  97%   Weight:  50.8 kg    Height:  5' 1.5" (1.562 m)      Wt Readings from Last 3 Encounters:  08/08/22 50.8 kg  07/23/22 51.2 kg  05/28/22 50.3 kg    No intake or output data in the 24 hours ending 08/08/22 1130   Physical Exam  Awake Alert, No new F.N deficits, Normal affect [place].AT,PERRAL Supple Neck, No JVD,   Symmetrical Chest wall movement, Good air movement bilaterally, CTAB RRR,No Gallops,Rubs or new Murmurs,  +ve B.Sounds, Abd Soft, No RUQ tenderness,   No Cyanosis, Clubbing or edema       Data Review:    Recent Labs  Lab 08/07/22 1710 08/08/22 0622  WBC 9.6 9.3  HGB 9.2* 8.1*  HCT 30.0* 25.4*  PLT 194 159  MCV 92.3 90.7  MCH 28.3 28.9  MCHC 30.7 31.9  RDW 16.1* 16.5*  LYMPHSABS 0.6*  --   MONOABS 0.2  --   EOSABS 0.0  --   BASOSABS 0.0  --     Recent Labs  Lab 08/07/22 1710 08/07/22 1915 08/07/22 2129 08/08/22 0622  NA 140  --   --  136  K 4.2  --   --  5.0  CL 107  --   --  102  CO2 21*  --   --  21*  ANIONGAP 12  --   --  13  GLUCOSE 228*  --   --  167*  BUN 63*  --   --  64*  CREATININE 1.45*  --   --  2.00*  AST  310*  --   --  4,600*  ALT 149*  --   --  2,504*  ALKPHOS 59  --   --  155*  BILITOT 1.2  --   --  2.9*  ALBUMIN 3.1*  --   --  2.6*  CRP  --   --   --  7.2*  PROCALCITON  --   --   --  51.50  LATICACIDVEN  --  3.5* 3.4* 1.6  BNP  --   --   --  365.7*  MG  --   --   --  2.0  CALCIUM 8.5*  --   --  7.7*      Recent Labs  Lab 08/07/22 1710 08/07/22 1915 08/07/22 2129 08/08/22 0622  CRP  --   --   --  7.2*  PROCALCITON  --   --   --  51.50  LATICACIDVEN  --  3.5* 3.4* 1.6  BNP  --   --   --  365.7*  MG  --   --   --  2.0  CALCIUM 8.5*  --   --  7.7*    Radiology Reports DG Chest Port 1 View  Result Date: 08/08/2022 CLINICAL DATA:  Shortness of breath EXAM: PORTABLE CHEST 1 VIEW COMPARISON:  Yesterday FINDINGS: Hazy density at the left base, atelectasis or scarring. Streak normal heart size and mediastinal contours. There is no edema, consolidation, effusion, or pneumothorax. Which is IMPRESSION: Mild scarring/atelectasis at the left lung base. No change from prior. Electronically Signed   By: Tiburcio Pea M.D.   On: 08/08/2022 06:42   US Abdomen Limited RUQ (LIVER/GB)  Result Date: 08/07/2022 CLINICAL DATA:  Right upper quadrant pain. EXAM: ULTRASOUND ABDOMEN LIMITED RIGHT UPPER QUADRANT COMPARISON:  Aug 02, 2018 FINDINGS: Gallbladder: Small echogenic gallstones and/or gallbladder sludge is suspected within the region of the gallbladder fundus. There is no evidence of gallbladder wall thickening (2.5 mm) no sonographic Murphy sign noted by sonographer. Common bile duct: Diameter: 7.1 mm Liver: No focal lesion identified. Within normal limits in parenchymal echogenicity. Portal vein is patent on color Doppler imaging with normal direction of blood flow towards the liver. Other: A 2.6 cm x 1.8 cm x 2.1 cm simple cyst is seen within the upper pole of the right kidney. IMPRESSION: 1. Cholelithiasis and/or gallbladder sludge, without evidence of acute cholecystitis. 2. Simple right renal  cyst. Electronically Signed   By: Aram Candela M.D.   On: 08/07/2022 21:17   CT ABDOMEN PELVIS W CONTRAST  Result Date: 08/07/2022 CLINICAL DATA:  Epigastric pain EXAM: CT ABDOMEN AND PELVIS WITH CONTRAST TECHNIQUE: Multidetector CT imaging of the abdomen and pelvis was performed using the standard protocol following bolus administration of intravenous contrast. RADIATION DOSE REDUCTION: This exam was performed according to the departmental dose-optimization program which includes automated exposure control, adjustment of the mA and/or kV according to patient size and/or use of iterative reconstruction technique. CONTRAST:  50mL OMNIPAQUE IOHEXOL 350 MG/ML SOLN COMPARISON:  01/24/2022 FINDINGS: Lower chest: Lung bases show mild left basilar atelectasis. No focal infiltrate or effusion is seen. Hepatobiliary: Liver is well visualized and within normal limits. Gallbladder is well distended with some dependent density consistent with small gallstones. Wall thickening and likely wall edema is noted. No obstructive changes are seen. Pancreas: Unremarkable. No pancreatic ductal dilatation or surrounding inflammatory changes. Spleen: Normal in size without focal abnormality. Adrenals/Urinary Tract: Adrenal glands are within normal limits. Kidneys are well visualized bilaterally within normal enhancement pattern. No renal calculi are noted. Simple appearing right renal cyst is noted measuring 2.8 cm. This is similar to that seen on the prior exam. No further follow-up is recommended. Smaller left renal cyst is noted also stable from the prior study. No obstructive changes are seen. The bladder is well distended. Stomach/Bowel: Scattered diverticular change of the colon is noted without evidence of diverticulitis. The appendix is within normal limits. No obstructive changes of the colon are seen. Small bowel and stomach are within normal limits. Vascular/Lymphatic: Aortic atherosclerosis.  No enlarged abdominal or  pelvic lymph nodes. Reproductive: Uterus and bilateral adnexa are unremarkable. Other: No abdominal wall hernia or abnormality. No abdominopelvic ascites. Musculoskeletal: Mild degenerative changes of lumbar spine are noted with anterolisthesis of L4 on L5. IMPRESSION: Cholelithiasis with evidence of gallbladder wall thickening and likely wall edema. Right upper quadrant ultrasound may be helpful for further evaluation as this may represent acute cholecystitis. Diverticulosis without diverticulitis. Simple renal cysts bilaterally stable from prior exam. No further follow-up is recommended. Electronically Signed   By: Alcide Clever M.D.   On: 08/07/2022 20:33   DG Chest Portable 1 View  Result Date: 08/07/2022 CLINICAL DATA:  Epigastric pain EXAM: PORTABLE CHEST 1 VIEW COMPARISON:  Chest x-ray 01/29/2022 FINDINGS: The heart size and mediastinal contours are within normal limits. Both lungs are clear. There is blunting of the left costophrenic angle. There is no pneumothorax. The visualized skeletal structures are unremarkable. IMPRESSION: Blunting of the left costophrenic angle may be due to pleural thickening or small pleural effusion. Electronically Signed   By: Darliss Cheney M.D.   On: 08/07/2022 18:01      Signature  -   Susa Raring M.D on 08/08/2022 at 11:30 AM   -  To page go to [email or web address]

## 2022-08-08 NOTE — Consult Note (Addendum)
Consultation  Referring Provider:  [facility]  Primary Care Physician:  Adrian Prince, MD Primary Gastroenterologist:  Dr. Leone Payor       Reason for Consultation:     Gallstone pancreatitis  LOS: 1 day          HPI:   QUADASIA DISBRO is a 89 y.o. female with past medical history significant for rheumatoid arthritis, insulin-dependent diabetes mellitus, CKD 3B, mitral regurgitation, PAF no longer anticoagulated, and history of 25-second pause followed by pacemaker implantation which was removed weeks later due to sepsis with MSSA bacteremia, recurrent c diff, presents for evaluation of gallstone pancreatitis.  Patient originally presented to emergency department with epigastric pain, nausea, vomiting, and chills.  Workup was consistent with gallstone pancreatitis and possible ascending cholangitis..  CT abdomen pelvis with contrast shows cholelithiasis with evidence of gallbladder wall thickening and wall edema.  Diverticulosis.  Pancreas unremarkable.  RUQ ultrasound shows cholelithiasis and gallbladder sludge without evidence of acute cholecystitis  Upon admission patient was found to be febrile 39.9 C.  Patient was given 2L of lactated Ringer's, fentanyl, Zofran, and Zosyn.\  Pertinent values - Lipase 8,872 on admission, now 2876 -WBC 9.3 -Hgb 8.1 -On admission AST 310/ALT 149/alk phos 59.... today AST 4600/ALT 2504/alk phos 155, worsening -Total bilirubin 2.9, worsening -CRP 7.2 (history of rheumatoid arthritis and on chronic steroids)  Surgery consulted and planning for cholecystectomy. [facility] has cleared patient to undergo surgery.  Patient with family at bedside,  . Provided some of the history.    Past Medical History:  Diagnosis Date   Bruit    Carotid Doppler showed no significant abnormality     9per patient)   C. difficile colitis 07/2018   with severe sepsis   Chest pain, unspecified    Nuclear, May, 2008, no scar or ischemia   Diabetes mellitus     Diverticulosis    Dyslipidemia    Ejection fraction    EF 55-60%, echo, February, 2011 // Echocardiogram 8/21: EF 60-65, no RWMA, Gr 1 DD, GLS -14%, normal RVSF, mild LAE, trivial MR, mild MS (mean 4 mmHg), RVSP 23.4    GERD (gastroesophageal reflux disease)    Mitral regurgitation 04/21/2009   mild,  echo, February, 2011   Osteoporosis    Palpitations    possible very brief atrial fibrillation on monitor and possible reentrant tachycardia   Psoriasis    Rheumatoid arthritis (HCC)     Surgical History:  She  has a past surgical history that includes Fracture surgery; ABDOMINAL AORTOGRAM W/LOWER EXTREMITY (Right, 10/19/2020); Transmetatarsal amputation (Right, 01/18/2021); PACEMAKER IMPLANT (N/A, 01/05/2022); LOOP RECORDER REMOVAL (N/A, 01/05/2022); PPM GENERATOR REMOVAL (N/A, 01/29/2022); and TEE without cardioversion (N/A, 01/29/2022). Family History:  Her family history includes Diabetes in her father and mother; Heart attack in her father and mother; Heart failure in her father and mother; Stroke in her sister. Social History:   reports that she has never smoked. She has never used smokeless tobacco. She reports that she does not drink alcohol and does not use drugs.  Prior to Admission medications   Medication Sig Start Date End Date Taking? Authorizing Provider  acetaminophen (TYLENOL) 500 MG tablet Take 1,000 mg by mouth daily.   Yes [provider]  amiodarone (PACERONE) 200 MG tablet Take 0.5 tablets (100 mg total) by mouth daily. 04/02/22  Yes Duke Salvia, MD  atorvastatin (LIPITOR) 20 MG tablet Take 1 tablet (20 mg total) by mouth daily. 02/28/22  Yes Nahser,  Deloris Ping, MD  bismuth subsalicylate (PEPTO BISMOL) 262 MG/15ML suspension Take 30 mLs by mouth daily at 6 (six) AM.   Yes [provider]  Calcium Carbonate-Vitamin D 600-400 MG-UNIT tablet Take 1 tablet by mouth daily at 12 noon.    Yes [provider]  diclofenac Sodium (VOLTAREN) 1 % GEL Apply  4 g topically 4 (four) times daily. 02/15/22  Yes Setzer, Lynnell Jude, PA-C  gabapentin (NEURONTIN) 100 MG capsule Take 1 capsule (100 mg total) by mouth 3 (three) times daily. Patient taking differently: Take 200 mg by mouth 3 (three) times daily. 02/06/21  Yes Love, Evlyn Kanner, PA-C  insulin aspart (NOVOLOG) 100 UNIT/ML FlexPen Inject 2 Units into the skin 3 (three) times daily with meals. Sliding scale 02/15/22  Yes Setzer, Lynnell Jude, PA-C  Insulin Degludec (TRESIBA) 100 UNIT/ML SOLN Inject 4 Units into the skin at bedtime. 02/15/22  Yes Setzer, Lynnell Jude, PA-C  methocarbamol (ROBAXIN) 500 MG tablet Take 500 mg by mouth 4 (four) times daily.   Yes [provider]  mirtazapine (REMERON) 7.5 MG tablet Take 7.5 mg by mouth at bedtime.   Yes [provider]  Multiple Vitamins-Minerals (CENTRUM SILVER 50+WOMEN PO) Take 1 tablet by mouth daily.   Yes [provider]  nitroGLYCERIN (NITROSTAT) 0.4 MG SL tablet Dissolve 1 tablet under the tongue every 5 minutes as needed for chest pain. Max of 3 doses, then 911. 07/23/22  Yes Nahser, Deloris Ping, MD  predniSONE (DELTASONE) 5 MG tablet Take 1 tablet (5 mg total) by mouth daily with breakfast. 02/15/22  Yes Setzer, Lynnell Jude, PA-C  protein supplement shake (PREMIER PROTEIN) LIQD Take 2 oz by mouth 2 (two) times daily.   Yes [provider]  pyrithione zinc (SELSUN BLUE DRY SCALP) 1 % shampoo Apply 1 application topically once a week. 04/15/15  Yes [provider]  RESTASIS 0.05 % ophthalmic emulsion Place 1 drop into both eyes 2 (two) times daily. 02/13/19  Yes [provider]    Current Facility-Administered Medications  Medication Dose Route Frequency Provider Last Rate Last Admin   acetaminophen (TYLENOL) tablet 650 mg  650 mg Oral Q6H PRN Opyd, Lavone Neri, MD       Or   acetaminophen (TYLENOL) suppository 650 mg  650 mg Rectal Q6H PRN Opyd, Lavone Neri, MD       amiodarone (PACERONE) tablet 100 mg  100 mg Oral  Daily Opyd, Lavone Neri, MD   100 mg at 08/08/22 0824   atorvastatin (LIPITOR) tablet 20 mg  20 mg Oral Daily Opyd, Lavone Neri, MD   20 mg at 08/08/22 1610   cycloSPORINE (RESTASIS) 0.05 % ophthalmic emulsion 1 drop  1 drop Both Eyes BID Opyd, Lavone Neri, MD   1 drop at 08/08/22 0826   diclofenac Sodium (VOLTAREN) 1 % topical gel 4 g  4 g Topical TID AC & HS Opyd, Lavone Neri, MD   4 g at 08/08/22 0826   fentaNYL (SUBLIMAZE) injection 12.5-50 mcg  12.5-50 mcg Intravenous Q2H PRN Briscoe Deutscher, MD   25 mcg at 08/08/22 0538   gabapentin (NEURONTIN) capsule 200 mg  200 mg Oral TID Briscoe Deutscher, MD   200 mg at 08/08/22 0825   heparin injection 5,000 Units  5,000 Units Subcutaneous Q8H Opyd, Lavone Neri, MD   5,000 Units at 08/08/22 0538   hydrocortisone sodium succinate (SOLU-CORTEF) 100 MG injection 100 mg  100 mg Intravenous Q12H Leroy Sea, MD  insulin aspart (novoLOG) injection 0-6 Units  0-6 Units Subcutaneous Q4H Opyd, Lavone Neri, MD   1 Units at 08/07/22 2334   lactated ringers infusion   Intravenous Continuous Leroy Sea, MD 75 mL/hr at 08/08/22 1100 Infusion Verify at 08/08/22 1100   methocarbamol (ROBAXIN) tablet 500 mg  500 mg Oral Q6H PRN Briscoe Deutscher, MD   500 mg at 08/08/22 0824   mirtazapine (REMERON) tablet 7.5 mg  7.5 mg Oral QHS Opyd, Lavone Neri, MD   7.5 mg at 08/07/22 2332   ondansetron (ZOFRAN) tablet 4 mg  4 mg Oral Q6H PRN Opyd, Lavone Neri, MD       Or   ondansetron (ZOFRAN) injection 4 mg  4 mg Intravenous Q6H PRN Opyd, Lavone Neri, MD       pantoprazole (PROTONIX) EC tablet 40 mg  40 mg Oral Daily Leroy Sea, MD       piperacillin-tazobactam (ZOSYN) IVPB 3.375 g  3.375 g Intravenous Q8H Francena Hanly, RPH 12.5 mL/hr at 08/08/22 0534 3.375 g at 08/08/22 0534   sodium chloride flush (NS) 0.9 % injection 3 mL  3 mL Intravenous Q12H Opyd, Lavone Neri, MD       sodium zirconium cyclosilicate (LOKELMA) packet 10 g  10 g Oral BID Leroy Sea, MD         Allergies as of 08/07/2022 - Review Complete 08/07/2022  Allergen Reaction Noted   Cholestyramine  09/04/2018   Compazine [prochlorperazine edisylate] Swelling 08/01/2012   Celecoxib Diarrhea 07/23/2022   Sulfonamide derivatives Other (See Comments)    Hydrocodone Other (See Comments) 01/11/2021   Infliximab Other (See Comments) 01/11/2021   Leflunomide Other (See Comments) 05/17/2021   Prochlorperazine Other (See Comments) 01/11/2021   Rosuvastatin  01/11/2021   Sulfa antibiotics Other (See Comments) 01/11/2021    Review of Systems:  All other systems reviewed and apart from HPI, are negative.    Physical Exam:  Vital signs in last 24 hours: Temp:  [97.8 F (36.6 C)-102 F (38.9 C)] 99 F (37.2 C) (06/05 0800) Pulse Rate:  [78-101] 78 (06/05 1000) Resp:  [15-24] 23 (06/05 1000) BP: (94-142)/(26-97) 112/35 (06/05 0800) SpO2:  [94 %-100 %] 97 % (06/05 1000) Weight:  [50.8 kg] 50.8 kg (06/05 0049)   Last BM recorded by nurses in past 5 days No data recorded  Physical Exam Constitutional:      Appearance: She is ill-appearing.  HENT:     Head: Normocephalic and atraumatic.     Nose: Nose normal. No congestion.     Mouth/Throat:     Mouth: Mucous membranes are moist.     Pharynx: Oropharynx is clear.  Eyes:     General: Scleral icterus present.     Extraocular Movements: Extraocular movements intact.  Cardiovascular:     Rate and Rhythm: Normal rate and regular rhythm.  Pulmonary:     Effort: Pulmonary effort is normal. No respiratory distress.  Abdominal:     General: Abdomen is flat. Bowel sounds are normal. There is no distension.     Palpations: Abdomen is soft. There is no mass.     Tenderness: There is no abdominal tenderness. There is no guarding or rebound.     Hernia: No hernia is present.  Musculoskeletal:        General: No swelling. Normal range of motion.     Cervical back: Normal range of motion and neck supple.  Skin:    General: Skin is warm  and dry.     Coloration: Skin is pale. Skin is not jaundiced.  Neurological:     General: No focal deficit present.     Mental Status: She is alert.  Psychiatric:        Mood and Affect: Mood normal.        Behavior: Behavior normal.        Thought Content: Thought content normal.        Judgment: Judgment normal.      LAB RESULTS: Recent Labs    08/07/22 1710 08/08/22 0622  WBC 9.6 9.3  HGB 9.2* 8.1*  HCT 30.0* 25.4*  PLT 194 159   BMET Recent Labs    08/07/22 1710 08/08/22 0622  NA 140 136  K 4.2 5.0  CL 107 102  CO2 21* 21*  GLUCOSE 228* 167*  BUN 63* 64*  CREATININE 1.45* 2.00*  CALCIUM 8.5* 7.7*   LFT Recent Labs    08/08/22 0622  PROT 5.3*  ALBUMIN 2.6*  AST 4,600*  ALT 2,504*  ALKPHOS 155*  BILITOT 2.9*   PT/INR No results for input(s): "LABPROT", "INR" in the last 72 hours.  STUDIES: US RENAL  Result Date: 08/08/2022 CLINICAL DATA:  Acute kidney injury EXAM: RENAL / URINARY TRACT ULTRASOUND COMPLETE COMPARISON:  CT abdomen pelvis with contrast 08/07/2022 FINDINGS: Right Kidney: Renal measurements: 9.7 x 4.1 x 4.4 cm = volume: 93 mL. Mild increased echogenicity the renal cortex without significant thinning. No mass or hydronephrosis visualized. 2.7 cm simple cyst in the upper pole does not require dedicated imaging follow-up. Left Kidney: Renal measurements: 9.6 x 4.9 x 4.8 cm = volume: 120 mL. Mild increased echogenicity the renal cortex without significant thinning. No mass or hydronephrosis visualized. Bladder: Appears normal for degree of bladder distention. Other: None. IMPRESSION: Mild increased echogenicity of the renal cortices without significant thinning indicative of medical renal disease. Electronically Signed   By: Acquanetta Belling M.D.   On: 08/08/2022 11:31   DG Chest Port 1 View  Result Date: 08/08/2022 CLINICAL DATA:  Shortness of breath EXAM: PORTABLE CHEST 1 VIEW COMPARISON:  Yesterday FINDINGS: Hazy density at the left base, atelectasis  or scarring. Streak normal heart size and mediastinal contours. There is no edema, consolidation, effusion, or pneumothorax. Which is IMPRESSION: Mild scarring/atelectasis at the left lung base. No change from prior. Electronically Signed   By: Tiburcio Pea M.D.   On: 08/08/2022 06:42   US Abdomen Limited RUQ (LIVER/GB)  Result Date: 08/07/2022 CLINICAL DATA:  Right upper quadrant pain. EXAM: ULTRASOUND ABDOMEN LIMITED RIGHT UPPER QUADRANT COMPARISON:  Aug 02, 2018 FINDINGS: Gallbladder: Small echogenic gallstones and/or gallbladder sludge is suspected within the region of the gallbladder fundus. There is no evidence of gallbladder wall thickening (2.5 mm) no sonographic Murphy sign noted by sonographer. Common bile duct: Diameter: 7.1 mm Liver: No focal lesion identified. Within normal limits in parenchymal echogenicity. Portal vein is patent on color Doppler imaging with normal direction of blood flow towards the liver. Other: A 2.6 cm x 1.8 cm x 2.1 cm simple cyst is seen within the upper pole of the right kidney. IMPRESSION: 1. Cholelithiasis and/or gallbladder sludge, without evidence of acute cholecystitis. 2. Simple right renal cyst. Electronically Signed   By: Aram Candela M.D.   On: 08/07/2022 21:17   CT ABDOMEN PELVIS W CONTRAST  Result Date: 08/07/2022 CLINICAL DATA:  Epigastric pain EXAM: CT ABDOMEN AND PELVIS WITH CONTRAST TECHNIQUE: Multidetector CT imaging of the abdomen and pelvis was  performed using the standard protocol following bolus administration of intravenous contrast. RADIATION DOSE REDUCTION: This exam was performed according to the departmental dose-optimization program which includes automated exposure control, adjustment of the mA and/or kV according to patient size and/or use of iterative reconstruction technique. CONTRAST:  50mL OMNIPAQUE IOHEXOL 350 MG/ML SOLN COMPARISON:  01/24/2022 FINDINGS: Lower chest: Lung bases show mild left basilar atelectasis. No focal  infiltrate or effusion is seen. Hepatobiliary: Liver is well visualized and within normal limits. Gallbladder is well distended with some dependent density consistent with small gallstones. Wall thickening and likely wall edema is noted. No obstructive changes are seen. Pancreas: Unremarkable. No pancreatic ductal dilatation or surrounding inflammatory changes. Spleen: Normal in size without focal abnormality. Adrenals/Urinary Tract: Adrenal glands are within normal limits. Kidneys are well visualized bilaterally within normal enhancement pattern. No renal calculi are noted. Simple appearing right renal cyst is noted measuring 2.8 cm. This is similar to that seen on the prior exam. No further follow-up is recommended. Smaller left renal cyst is noted also stable from the prior study. No obstructive changes are seen. The bladder is well distended. Stomach/Bowel: Scattered diverticular change of the colon is noted without evidence of diverticulitis. The appendix is within normal limits. No obstructive changes of the colon are seen. Small bowel and stomach are within normal limits. Vascular/Lymphatic: Aortic atherosclerosis. No enlarged abdominal or pelvic lymph nodes. Reproductive: Uterus and bilateral adnexa are unremarkable. Other: No abdominal wall hernia or abnormality. No abdominopelvic ascites. Musculoskeletal: Mild degenerative changes of lumbar spine are noted with anterolisthesis of L4 on L5. IMPRESSION: Cholelithiasis with evidence of gallbladder wall thickening and likely wall edema. Right upper quadrant ultrasound may be helpful for further evaluation as this may represent acute cholecystitis. Diverticulosis without diverticulitis. Simple renal cysts bilaterally stable from prior exam. No further follow-up is recommended. Electronically Signed   By: Alcide Clever M.D.   On: 08/07/2022 20:33   DG Chest Portable 1 View  Result Date: 08/07/2022 CLINICAL DATA:  Epigastric pain EXAM: PORTABLE CHEST 1 VIEW  COMPARISON:  Chest x-ray 01/29/2022 FINDINGS: The heart size and mediastinal contours are within normal limits. Both lungs are clear. There is blunting of the left costophrenic angle. There is no pneumothorax. The visualized skeletal structures are unremarkable. IMPRESSION: Blunting of the left costophrenic angle may be due to pleural thickening or small pleural effusion. Electronically Signed   By: Darliss Cheney M.D.   On: 08/07/2022 18:01      Impression    Acute biliary pancreatitis Sepsis (secondary to ascending cholangitis) -Lipase 8,872 on admission, now 2876 -WBC 9.3 -Hgb 8.1, likely hemodilution s/p IVF -AST 4600/ALT 2504/alk phos 155, worsening -Total bilirubin 2.9 -CRP 7.2 (history of rheumatoid arthritis and on chronic steroids) -CT abdomen pelvis with contrast shows cholelithiasis with evidence of gallbladder wall thickening and wall edema.  Diverticulosis.  Pancreas unremarkable. -RUQ ultrasound shows cholelithiasis and gallbladder sludge without evidence of acute cholecystitis. CBD 7.65mm - Lipid panel ordered - On Zosyn Symptoms consistent with gallstone pancreatitis. Lipid panel ordered. LFT elevation initially secondary to gallstone pancreatitis versus possible CBD stone (less likely CBD stone with normal CBD of 7.64mm) but with acute rise into the 1000s suspect secondary to shock liver in the setting of sepsis. Surgery planning for cholecystectomy tomorrow with IOC.   Paroxysmal atrial fibrillation -Not on anticoagulation  Stage IIIb chronic kidney disease -BUN 64, creatinine 2.00, GFR 24  Unontrolled type 2 diabetes   Plan   - Appreciate General Surgery. Planning for  Lap chole with IOC tomorrow - If IOC is positive, will recommend ERCP. For now, will follow along. - Continue to trend LFTs - Continue supportive care - Continue IVF - Continue pain control - Diet per surgical team.  Thank you for your kind consultation, we will continue to follow.   Bayley Leanna Sato  08/08/2022, 11:56 AM    Attending physician's note  I have taken a history, reviewed the chart and examined the patient. I performed a substantive portion of this encounter, including complete performance of at least one of the key components, in conjunction with the APP. I agree with the APP's note, impression and recommendations.    89 year old female with history of diabetes, CKD type III admitted with severe epigastric pain, nausea, vomiting and chills.  She has elevated lipase and transaminases, cholelithiasis, presentation is concerning for gallstone pancreatitis No CBD dilation or evidence of choledocholithiasis.  Plan for cholecystectomy with IOC tomorrow.  Based on findings if IOC positive, will need ERCP with sphincterotomy and stone extraction Continue Zosyn  Monitor LFT  GI will continue to follow along  Family at bedside/daughter was provided an opportunity to ask questions and all were answered.  She agreed with the plan and demonstrated an understanding of the instructions.  [name] , MD [phone]

## 2022-08-09 ENCOUNTER — Encounter (HOSPITAL_COMMUNITY): Payer: Self-pay | Admitting: Family Medicine

## 2022-08-09 ENCOUNTER — Other Ambulatory Visit: Payer: Self-pay

## 2022-08-09 ENCOUNTER — Inpatient Hospital Stay (HOSPITAL_COMMUNITY): Payer: Medicare Other | Admitting: Anesthesiology

## 2022-08-09 ENCOUNTER — Inpatient Hospital Stay (HOSPITAL_COMMUNITY): Payer: Medicare Other

## 2022-08-09 ENCOUNTER — Encounter (HOSPITAL_COMMUNITY)
Admission: EM | Disposition: A | Discharge status: Home Health | Payer: Self-pay | Source: Home / Self Care | Attending: Internal Medicine

## 2022-08-09 DIAGNOSIS — I10 Essential (primary) hypertension: Secondary | ICD-10-CM

## 2022-08-09 DIAGNOSIS — K851 Biliary acute pancreatitis without necrosis or infection: Secondary | ICD-10-CM

## 2022-08-09 DIAGNOSIS — I2510 Atherosclerotic heart disease of native coronary artery without angina pectoris: Secondary | ICD-10-CM

## 2022-08-09 DIAGNOSIS — I480 Paroxysmal atrial fibrillation: Secondary | ICD-10-CM | POA: Diagnosis not present

## 2022-08-09 DIAGNOSIS — I4891 Unspecified atrial fibrillation: Secondary | ICD-10-CM

## 2022-08-09 DIAGNOSIS — Z794 Long term (current) use of insulin: Secondary | ICD-10-CM

## 2022-08-09 DIAGNOSIS — E119 Type 2 diabetes mellitus without complications: Secondary | ICD-10-CM

## 2022-08-09 HISTORY — PX: CHOLECYSTECTOMY: SHX55

## 2022-08-09 LAB — COMPREHENSIVE METABOLIC PANEL
ALT: 1424 U/L — ABNORMAL HIGH (ref 0–44)
AST: 1286 U/L — ABNORMAL HIGH (ref 15–41)
Albumin: 2.2 g/dL — ABNORMAL LOW (ref 3.5–5.0)
Alkaline Phosphatase: 136 U/L — ABNORMAL HIGH (ref 38–126)
Anion gap: 13 (ref 5–15)
BUN: 55 mg/dL — ABNORMAL HIGH (ref 8–23)
CO2: 21 mmol/L — ABNORMAL LOW (ref 22–32)
Calcium: 7.5 mg/dL — ABNORMAL LOW (ref 8.9–10.3)
Chloride: 107 mmol/L (ref 98–111)
Creatinine, Ser: 1.78 mg/dL — ABNORMAL HIGH (ref 0.44–1.00)
GFR, Estimated: 27 mL/min — ABNORMAL LOW (ref >60)
Glucose, Bld: 192 mg/dL — ABNORMAL HIGH (ref 70–99)
Potassium: 3.7 mmol/L (ref 3.5–5.1)
Sodium: 141 mmol/L (ref 135–145)
Total Bilirubin: 1.7 mg/dL — ABNORMAL HIGH (ref 0.3–1.2)
Total Protein: 4.7 g/dL — ABNORMAL LOW (ref 6.5–8.1)

## 2022-08-09 LAB — CBC WITH DIFFERENTIAL/PLATELET
Abs Immature Granulocytes: 0 10*3/uL (ref 0.00–0.07)
Basophils Absolute: 0.2 10*3/uL — ABNORMAL HIGH (ref 0.0–0.1)
Basophils Relative: 2 %
Eosinophils Absolute: 0 10*3/uL (ref 0.0–0.5)
Eosinophils Relative: 0 %
HCT: 26.1 % — ABNORMAL LOW (ref 36.0–46.0)
Hemoglobin: 8.4 g/dL — ABNORMAL LOW (ref 12.0–15.0)
Lymphocytes Relative: 2 %
Lymphs Abs: 0.2 10*3/uL — ABNORMAL LOW (ref 0.7–4.0)
MCH: 29.3 pg (ref 26.0–34.0)
MCHC: 32.2 g/dL (ref 30.0–36.0)
MCV: 90.9 fL (ref 80.0–100.0)
Monocytes Absolute: 0 10*3/uL — ABNORMAL LOW (ref 0.1–1.0)
Monocytes Relative: 0 %
Neutro Abs: 7.5 10*3/uL (ref 1.7–7.7)
Neutrophils Relative %: 96 %
Platelets: 133 10*3/uL — ABNORMAL LOW (ref 150–400)
RBC: 2.87 MIL/uL — ABNORMAL LOW (ref 3.87–5.11)
RDW: 16.2 % — ABNORMAL HIGH (ref 11.5–15.5)
WBC: 7.8 10*3/uL (ref 4.0–10.5)
nRBC: 0 % (ref 0.0–0.2)
nRBC: 0 /100 WBC

## 2022-08-09 LAB — GLUCOSE, CAPILLARY
Glucose-Capillary: 132 mg/dL — ABNORMAL HIGH (ref 70–99)
Glucose-Capillary: 165 mg/dL — ABNORMAL HIGH (ref 70–99)
Glucose-Capillary: 175 mg/dL — ABNORMAL HIGH (ref 70–99)
Glucose-Capillary: 189 mg/dL — ABNORMAL HIGH (ref 70–99)
Glucose-Capillary: 208 mg/dL — ABNORMAL HIGH (ref 70–99)
Glucose-Capillary: 328 mg/dL — ABNORMAL HIGH (ref 70–99)

## 2022-08-09 LAB — BRAIN NATRIURETIC PEPTIDE: B Natriuretic Peptide: 866.7 pg/mL — ABNORMAL HIGH (ref 0.0–100.0)

## 2022-08-09 LAB — MRSA NEXT GEN BY PCR, NASAL: MRSA by PCR Next Gen: NOT DETECTED

## 2022-08-09 LAB — C-REACTIVE PROTEIN: CRP: 10.8 mg/dL — ABNORMAL HIGH (ref <1.0)

## 2022-08-09 LAB — MAGNESIUM: Magnesium: 2.1 mg/dL (ref 1.7–2.4)

## 2022-08-09 LAB — PROCALCITONIN: Procalcitonin: 34.93 ng/mL

## 2022-08-09 LAB — LIPASE, BLOOD: Lipase: 103 U/L — ABNORMAL HIGH (ref 11–51)

## 2022-08-09 SURGERY — LAPAROSCOPIC CHOLECYSTECTOMY WITH INTRAOPERATIVE CHOLANGIOGRAM
Anesthesia: General | Site: Abdomen

## 2022-08-09 MED ORDER — CHLORHEXIDINE GLUCONATE 0.12 % MT SOLN
OROMUCOSAL | Status: AC
  Administered 2022-08-09: 15 mL via OROMUCOSAL
  Filled 2022-08-09: qty 15

## 2022-08-09 MED ORDER — DEXAMETHASONE SODIUM PHOSPHATE 10 MG/ML IJ SOLN
INTRAMUSCULAR | Status: AC
  Filled 2022-08-09: qty 1

## 2022-08-09 MED ORDER — SODIUM CHLORIDE 0.9 % IR SOLN
Status: DC | PRN
  Administered 2022-08-09: 1000 mL

## 2022-08-09 MED ORDER — DOCUSATE SODIUM 100 MG PO CAPS
100.0000 mg | ORAL_CAPSULE | Freq: Two times a day (BID) | ORAL | Status: DC
  Administered 2022-08-09 – 2022-08-12 (×6): 100 mg via ORAL
  Filled 2022-08-09 (×6): qty 1

## 2022-08-09 MED ORDER — BUPIVACAINE-EPINEPHRINE (PF) 0.25% -1:200000 IJ SOLN
INTRAMUSCULAR | Status: AC
  Filled 2022-08-09: qty 30

## 2022-08-09 MED ORDER — EPHEDRINE SULFATE-NACL 50-0.9 MG/10ML-% IV SOSY
PREFILLED_SYRINGE | INTRAVENOUS | Status: DC | PRN
  Administered 2022-08-09: 5 mg via INTRAVENOUS

## 2022-08-09 MED ORDER — ONDANSETRON HCL 4 MG/2ML IJ SOLN
INTRAMUSCULAR | Status: AC
  Filled 2022-08-09: qty 4

## 2022-08-09 MED ORDER — ROCURONIUM BROMIDE 10 MG/ML (PF) SYRINGE
PREFILLED_SYRINGE | INTRAVENOUS | Status: AC
  Filled 2022-08-09: qty 20

## 2022-08-09 MED ORDER — SODIUM CHLORIDE 0.9 % IV SOLN
INTRAVENOUS | Status: DC | PRN
  Administered 2022-08-09: 40 mL

## 2022-08-09 MED ORDER — FENTANYL CITRATE (PF) 100 MCG/2ML IJ SOLN
25.0000 ug | INTRAMUSCULAR | Status: DC | PRN
  Administered 2022-08-09: 25 ug via INTRAVENOUS

## 2022-08-09 MED ORDER — ONDANSETRON HCL 4 MG/2ML IJ SOLN
INTRAMUSCULAR | Status: AC
  Filled 2022-08-09: qty 2

## 2022-08-09 MED ORDER — ONDANSETRON HCL 4 MG/2ML IJ SOLN
INTRAMUSCULAR | Status: DC | PRN
  Administered 2022-08-09: 4 mg via INTRAVENOUS

## 2022-08-09 MED ORDER — HYDROCORTISONE SOD SUC (PF) 100 MG IJ SOLR
50.0000 mg | Freq: Two times a day (BID) | INTRAMUSCULAR | Status: DC
  Administered 2022-08-09 – 2022-08-11 (×5): 50 mg via INTRAVENOUS
  Filled 2022-08-09 (×5): qty 2

## 2022-08-09 MED ORDER — ROCURONIUM BROMIDE 10 MG/ML (PF) SYRINGE
PREFILLED_SYRINGE | INTRAVENOUS | Status: DC | PRN
  Administered 2022-08-09: 50 mg via INTRAVENOUS

## 2022-08-09 MED ORDER — ACETAMINOPHEN 325 MG PO TABS
650.0000 mg | ORAL_TABLET | Freq: Four times a day (QID) | ORAL | Status: DC
  Administered 2022-08-09 – 2022-08-12 (×10): 650 mg via ORAL
  Filled 2022-08-09 (×9): qty 2

## 2022-08-09 MED ORDER — CHLORHEXIDINE GLUCONATE CLOTH 2 % EX PADS
6.0000 | MEDICATED_PAD | Freq: Every day | CUTANEOUS | Status: DC
  Administered 2022-08-09 – 2022-08-10 (×2): 6 via TOPICAL

## 2022-08-09 MED ORDER — EPHEDRINE 5 MG/ML INJ
INTRAVENOUS | Status: AC
  Filled 2022-08-09: qty 5

## 2022-08-09 MED ORDER — ONDANSETRON HCL 4 MG/2ML IJ SOLN: 4.0000 mg | Freq: Four times a day (QID) | INTRAMUSCULAR | Status: DC | PRN

## 2022-08-09 MED ORDER — BUPIVACAINE HCL 0.25 % IJ SOLN
INTRAMUSCULAR | Status: DC | PRN
  Administered 2022-08-09: 30 mL

## 2022-08-09 MED ORDER — ORAL CARE MOUTH RINSE
15.0000 mL | Freq: Once | OROMUCOSAL | Status: AC
  Administered 2022-08-09: 15 mL via OROMUCOSAL

## 2022-08-09 MED ORDER — PHENYLEPHRINE 80 MCG/ML (10ML) SYRINGE FOR IV PUSH (FOR BLOOD PRESSURE SUPPORT)
PREFILLED_SYRINGE | INTRAVENOUS | Status: AC
  Filled 2022-08-09: qty 10

## 2022-08-09 MED ORDER — INSULIN ASPART 100 UNIT/ML IJ SOLN
0.0000 [IU] | INTRAMUSCULAR | Status: DC | PRN
  Administered 2022-08-09: 2 [IU] via SUBCUTANEOUS

## 2022-08-09 MED ORDER — LIDOCAINE 2% (20 MG/ML) 5 ML SYRINGE
INTRAMUSCULAR | Status: AC
  Filled 2022-08-09: qty 10

## 2022-08-09 MED ORDER — FENTANYL CITRATE (PF) 100 MCG/2ML IJ SOLN
INTRAMUSCULAR | Status: AC
  Filled 2022-08-09: qty 2

## 2022-08-09 MED ORDER — OXYCODONE HCL 5 MG PO TABS
5.0000 mg | ORAL_TABLET | ORAL | Status: DC | PRN
  Administered 2022-08-09 – 2022-08-10 (×2): 5 mg via ORAL
  Filled 2022-08-09 (×2): qty 1

## 2022-08-09 MED ORDER — SUCCINYLCHOLINE CHLORIDE 200 MG/10ML IV SOSY
PREFILLED_SYRINGE | INTRAVENOUS | Status: AC
  Filled 2022-08-09: qty 10

## 2022-08-09 MED ORDER — SIMETHICONE 80 MG PO CHEW: 80.0000 mg | CHEWABLE_TABLET | Freq: Four times a day (QID) | ORAL | Status: DC | PRN

## 2022-08-09 MED ORDER — PROPOFOL 10 MG/ML IV BOLUS
INTRAVENOUS | Status: DC | PRN
  Administered 2022-08-09: 80 mg via INTRAVENOUS

## 2022-08-09 MED ORDER — INSULIN ASPART 100 UNIT/ML IJ SOLN
INTRAMUSCULAR | Status: AC
  Administered 2022-08-10: 1 [IU] via SUBCUTANEOUS
  Filled 2022-08-09: qty 1

## 2022-08-09 MED ORDER — LACTATED RINGERS IV SOLN: INTRAVENOUS | Status: DC

## 2022-08-09 MED ORDER — PROPOFOL 10 MG/ML IV BOLUS
INTRAVENOUS | Status: AC
  Filled 2022-08-09: qty 20

## 2022-08-09 MED ORDER — ORAL CARE MOUTH RINSE: 15.0000 mL | Freq: Once | OROMUCOSAL | Status: AC

## 2022-08-09 MED ORDER — CHLORHEXIDINE GLUCONATE 0.12 % MT SOLN: 15.0000 mL | Freq: Once | OROMUCOSAL | Status: AC

## 2022-08-09 MED ORDER — DEXAMETHASONE SODIUM PHOSPHATE 10 MG/ML IJ SOLN
INTRAMUSCULAR | Status: AC
  Filled 2022-08-09: qty 2

## 2022-08-09 MED ORDER — LIDOCAINE 2% (20 MG/ML) 5 ML SYRINGE
INTRAMUSCULAR | Status: AC
  Filled 2022-08-09: qty 5

## 2022-08-09 MED ORDER — HEMOSTATIC AGENTS (NO CHARGE) OPTIME
TOPICAL | Status: DC | PRN
  Administered 2022-08-09: 1 via TOPICAL

## 2022-08-09 MED ORDER — SUGAMMADEX SODIUM 200 MG/2ML IV SOLN
INTRAVENOUS | Status: DC | PRN
  Administered 2022-08-09: 200 mg via INTRAVENOUS

## 2022-08-09 MED ORDER — AMLODIPINE BESYLATE 10 MG PO TABS
10.0000 mg | ORAL_TABLET | Freq: Every day | ORAL | Status: DC
  Administered 2022-08-09 – 2022-08-12 (×3): 10 mg via ORAL
  Filled 2022-08-09 (×5): qty 1

## 2022-08-09 MED ORDER — FENTANYL CITRATE (PF) 250 MCG/5ML IJ SOLN
INTRAMUSCULAR | Status: AC
  Filled 2022-08-09: qty 5

## 2022-08-09 MED ORDER — CEFAZOLIN SODIUM-DEXTROSE 2-3 GM-%(50ML) IV SOLR
INTRAVENOUS | Status: DC | PRN
  Administered 2022-08-09: 2 g via INTRAVENOUS

## 2022-08-09 MED ORDER — FENTANYL CITRATE (PF) 250 MCG/5ML IJ SOLN
INTRAMUSCULAR | Status: DC | PRN
  Administered 2022-08-09 (×2): 25 ug via INTRAVENOUS
  Administered 2022-08-09: 50 ug via INTRAVENOUS

## 2022-08-09 MED ORDER — 0.9 % SODIUM CHLORIDE (POUR BTL) OPTIME
TOPICAL | Status: DC | PRN
  Administered 2022-08-09: 1000 mL

## 2022-08-09 MED ORDER — DEXAMETHASONE SODIUM PHOSPHATE 10 MG/ML IJ SOLN
INTRAMUSCULAR | Status: DC | PRN
  Administered 2022-08-09: 4 mg via INTRAVENOUS

## 2022-08-09 MED ORDER — LIDOCAINE 2% (20 MG/ML) 5 ML SYRINGE
INTRAMUSCULAR | Status: DC | PRN
  Administered 2022-08-09: 20 mg via INTRAVENOUS

## 2022-08-09 MED ORDER — CEFAZOLIN SODIUM-DEXTROSE 2-4 GM/100ML-% IV SOLN
INTRAVENOUS | Status: AC
  Filled 2022-08-09: qty 100

## 2022-08-09 MED ORDER — LACTATED RINGERS IV SOLN
INTRAVENOUS | Status: AC
  Administered 2022-08-09 (×2): 75 mL/h via INTRAVENOUS

## 2022-08-09 MED ORDER — ROCURONIUM BROMIDE 10 MG/ML (PF) SYRINGE
PREFILLED_SYRINGE | INTRAVENOUS | Status: AC
  Filled 2022-08-09: qty 10

## 2022-08-09 SURGICAL SUPPLY — 50 items
ADH SKN CLS APL DERMABOND .7 (GAUZE/BANDAGES/DRESSINGS) ×1
APL PRP STRL LF DISP 70% ISPRP (MISCELLANEOUS) ×1
APPLIER CLIP ROT 10 11.4 M/L (STAPLE) ×1
APR CLP MED LRG 11.4X10 (STAPLE) ×1
BAG COUNTER SPONGE SURGICOUNT (BAG) ×2 IMPLANT
BAG SPEC RTRVL 10 TROC 200 (ENDOMECHANICALS) ×1
BAG SPNG CNTER NS LX DISP (BAG) ×1
CANISTER SUCT 3000ML PPV (MISCELLANEOUS) ×2 IMPLANT
CATH URETL OPEN 5X70 (CATHETERS) ×2 IMPLANT
CHLORAPREP W/TINT 26 (MISCELLANEOUS) ×2 IMPLANT
CLIP APPLIE ROT 10 11.4 M/L (STAPLE) ×2 IMPLANT
COVER MAYO STAND STRL (DRAPES) IMPLANT
COVER SURGICAL LIGHT HANDLE (MISCELLANEOUS) ×2 IMPLANT
DERMABOND ADVANCED .7 DNX12 (GAUZE/BANDAGES/DRESSINGS) ×2 IMPLANT
DRAPE C-ARM 42X120 X-RAY (DRAPES) ×2 IMPLANT
ELECT REM PT RETURN 9FT ADLT (ELECTROSURGICAL) ×1
ELECTRODE REM PT RTRN 9FT ADLT (ELECTROSURGICAL) ×2 IMPLANT
ENDOLOOP SUT PDS II 0 18 (SUTURE) IMPLANT
GLOVE BIO SURGEON STRL SZ7.5 (GLOVE) ×2 IMPLANT
GLOVE BIOGEL PI IND STRL 8 (GLOVE) ×2 IMPLANT
GOWN STRL REUS W/ TWL LRG LVL3 (GOWN DISPOSABLE) ×4 IMPLANT
GOWN STRL REUS W/ TWL XL LVL3 (GOWN DISPOSABLE) ×2 IMPLANT
GOWN STRL REUS W/TWL LRG LVL3 (GOWN DISPOSABLE) ×2
GOWN STRL REUS W/TWL XL LVL3 (GOWN DISPOSABLE) ×1
GRASPER SUT TROCAR 14GX15 (MISCELLANEOUS) ×2 IMPLANT
HEMOSTAT SNOW SURGICEL 2X4 (HEMOSTASIS) IMPLANT
IRRIG SUCT STRYKERFLOW 2 WTIP (MISCELLANEOUS) ×1
IRRIGATION SUCT STRKRFLW 2 WTP (MISCELLANEOUS) ×2 IMPLANT
KIT BASIN OR (CUSTOM PROCEDURE TRAY) ×2 IMPLANT
KIT TURNOVER KIT B (KITS) ×2 IMPLANT
NDL 22X1.5 STRL (OR ONLY) (MISCELLANEOUS) ×2 IMPLANT
NDL INSUFFLATION 14GA 120MM (NEEDLE) ×2 IMPLANT
NEEDLE 22X1.5 STRL (OR ONLY) (MISCELLANEOUS) ×1 IMPLANT
NEEDLE INSUFFLATION 14GA 120MM (NEEDLE) ×1 IMPLANT
NS IRRIG 1000ML POUR BTL (IV SOLUTION) ×2 IMPLANT
PAD ARMBOARD 7.5X6 YLW CONV (MISCELLANEOUS) ×2 IMPLANT
POUCH RETRIEVAL ECOSAC 10 (ENDOMECHANICALS) ×2 IMPLANT
SCISSORS LAP 5X35 DISP (ENDOMECHANICALS) ×2 IMPLANT
SET TUBE SMOKE EVAC HIGH FLOW (TUBING) ×2 IMPLANT
SLEEVE Z-THREAD 5X100MM (TROCAR) ×4 IMPLANT
SPECIMEN JAR SMALL (MISCELLANEOUS) ×2 IMPLANT
STOPCOCK 4 WAY LG BORE MALE ST (IV SETS) ×2 IMPLANT
SUT MNCRL AB 4-0 PS2 18 (SUTURE) ×2 IMPLANT
TOWEL GREEN STERILE (TOWEL DISPOSABLE) ×2 IMPLANT
TOWEL GREEN STERILE FF (TOWEL DISPOSABLE) ×2 IMPLANT
TRAY LAPAROSCOPIC MC (CUSTOM PROCEDURE TRAY) ×2 IMPLANT
TROCAR 11X100 Z THREAD (TROCAR) ×2 IMPLANT
TROCAR Z-THREAD OPTICAL 5X100M (TROCAR) ×2 IMPLANT
WARMER LAPAROSCOPE (MISCELLANEOUS) ×2 IMPLANT
WATER STERILE IRR 1000ML POUR (IV SOLUTION) ×2 IMPLANT

## 2022-08-09 NOTE — Progress Notes (Addendum)
Rounding Note    Patient Name: Shelby Walker Date of Encounter: 08/09/2022  [facility] Cardiologist: Kristeen Miss, MD   Subjective   Feeling good today without any.  Inpatient Medications    Scheduled Meds:  amiodarone  100 mg Oral Daily   amLODipine  10 mg Oral Daily   atorvastatin  20 mg Oral Daily   cycloSPORINE  1 drop Both Eyes BID   diclofenac Sodium  4 g Topical TID AC & HS   gabapentin  200 mg Oral TID   heparin  5,000 Units Subcutaneous Q8H   hydrocortisone sod succinate (SOLU-CORTEF) inj  50 mg Intravenous Q12H   insulin aspart  0-6 Units Subcutaneous Q4H   mirtazapine  7.5 mg Oral QHS   pantoprazole  40 mg Oral Daily   sodium chloride flush  3 mL Intravenous Q12H   Continuous Infusions:  ceFEPime (MAXIPIME) IV 200 mL/hr at 08/08/22 1600   lactated ringers 75 mL/hr at 08/09/22 0545   metronidazole 500 mg (08/09/22 0940)   PRN Meds: acetaminophen **OR** acetaminophen, fentaNYL (SUBLIMAZE) injection, methocarbamol, ondansetron **OR** ondansetron (ZOFRAN) IV   Vital Signs    Vitals:   08/08/22 2000 08/08/22 2312 08/09/22 0311 08/09/22 0800  BP: (!) 137/51 (!) 151/60 (!) 178/68   Pulse: 67 65 63   Resp: 16 18 14    Temp: 97.6 F (36.4 C) 97.7 F (36.5 C) 97.8 F (36.6 C) 97.7 F (36.5 C)  TempSrc: Oral Oral Oral Oral  SpO2: 99% 99% 100%   Weight:      Height:        Intake/Output Summary (Last 24 hours) at 08/09/2022 0957 Last data filed at 08/09/2022 0900 Gross per 24 hour  Intake 364.84 ml  Output 1900 ml  Net -1535.16 ml      08/08/2022   12:49 AM 07/23/2022   11:04 AM 05/28/2022    3:12 PM  Last 3 Weights  Weight (lbs) 111 lb 15.9 oz 112 lb 12.8 oz 111 lb  Weight (kg) 50.8 kg 51.166 kg 50.349 kg      Telemetry    Normal sinus rhythm heart rates in the 60s.- Personally Reviewed  ECG    No new tracings- Personally Reviewed  Physical Exam   GEN: Scleral icterus Neck: No JVD Cardiac: RRR, no murmurs, rubs, or gallops.   Respiratory: Clear to auscultation bilaterally. GI: Soft, nontender, non-distended  MS: No edema; No deformity. Neuro:  Nonfocal  Psych: Normal affect   Labs    High Sensitivity Troponin:   Recent Labs  Lab 08/07/22 1710 08/07/22 1848  TROPONINIHS 7 11     Chemistry Recent Labs  Lab 08/07/22 1710 08/08/22 0622 08/09/22 0714  NA 140 136 141  K 4.2 5.0 3.7  CL 107 102 107  CO2 21* 21* 21*  GLUCOSE 228* 167* 192*  BUN 63* 64* 55*  CREATININE 1.45* 2.00* 1.78*  CALCIUM 8.5* 7.7* 7.5*  MG  --  2.0 2.1  PROT 5.8* 5.3* 4.7*  ALBUMIN 3.1* 2.6* 2.2*  AST 310* 4,600* 1,286*  ALT 149* 2,504* 1,424*  ALKPHOS 59 155* 136*  BILITOT 1.2 2.9* 1.7*  GFRNONAA 35* 24* 27*  ANIONGAP 12 13 13     Lipids  Recent Labs  Lab 08/08/22 1249  CHOL 111  TRIG 153*  HDL 29*  LDLCALC 51  CHOLHDL 3.8    Hematology Recent Labs  Lab 08/07/22 1710 08/08/22 0622 08/09/22 0714  WBC 9.6 9.3 7.8  RBC 3.25* 2.80* 2.87*  HGB 9.2* 8.1* 8.4*  HCT 30.0* 25.4* 26.1*  MCV 92.3 90.7 90.9  MCH 28.3 28.9 29.3  MCHC 30.7 31.9 32.2  RDW 16.1* 16.5* 16.2*  PLT 194 159 133*   Thyroid No results for input(s): "TSH", "FREET4" in the last 168 hours.  BNP Recent Labs  Lab 08/08/22 0622 08/09/22 0714  BNP 365.7* 866.7*    DDimer No results for input(s): "DDIMER" in the last 168 hours.   Radiology    US RENAL  Result Date: 08/08/2022 CLINICAL DATA:  Acute kidney injury EXAM: RENAL / URINARY TRACT ULTRASOUND COMPLETE COMPARISON:  CT abdomen pelvis with contrast 08/07/2022 FINDINGS: Right Kidney: Renal measurements: 9.7 x 4.1 x 4.4 cm = volume: 93 mL. Mild increased echogenicity the renal cortex without significant thinning. No mass or hydronephrosis visualized. 2.7 cm simple cyst in the upper pole does not require dedicated imaging follow-up. Left Kidney: Renal measurements: 9.6 x 4.9 x 4.8 cm = volume: 120 mL. Mild increased echogenicity the renal cortex without significant thinning. No mass or  hydronephrosis visualized. Bladder: Appears normal for degree of bladder distention. Other: None. IMPRESSION: Mild increased echogenicity of the renal cortices without significant thinning indicative of medical renal disease. Electronically Signed   By: Acquanetta Belling M.D.   On: 08/08/2022 11:31   DG Chest Port 1 View  Result Date: 08/08/2022 CLINICAL DATA:  Shortness of breath EXAM: PORTABLE CHEST 1 VIEW COMPARISON:  Yesterday FINDINGS: Hazy density at the left base, atelectasis or scarring. Streak normal heart size and mediastinal contours. There is no edema, consolidation, effusion, or pneumothorax. Which is IMPRESSION: Mild scarring/atelectasis at the left lung base. No change from prior. Electronically Signed   By: Tiburcio Pea M.D.   On: 08/08/2022 06:42   US Abdomen Limited RUQ (LIVER/GB)  Result Date: 08/07/2022 CLINICAL DATA:  Right upper quadrant pain. EXAM: ULTRASOUND ABDOMEN LIMITED RIGHT UPPER QUADRANT COMPARISON:  Aug 02, 2018 FINDINGS: Gallbladder: Small echogenic gallstones and/or gallbladder sludge is suspected within the region of the gallbladder fundus. There is no evidence of gallbladder wall thickening (2.5 mm) no sonographic Murphy sign noted by sonographer. Common bile duct: Diameter: 7.1 mm Liver: No focal lesion identified. Within normal limits in parenchymal echogenicity. Portal vein is patent on color Doppler imaging with normal direction of blood flow towards the liver. Other: A 2.6 cm x 1.8 cm x 2.1 cm simple cyst is seen within the upper pole of the right kidney. IMPRESSION: 1. Cholelithiasis and/or gallbladder sludge, without evidence of acute cholecystitis. 2. Simple right renal cyst. Electronically Signed   By: Aram Candela M.D.   On: 08/07/2022 21:17   CT ABDOMEN PELVIS W CONTRAST  Result Date: 08/07/2022 CLINICAL DATA:  Epigastric pain EXAM: CT ABDOMEN AND PELVIS WITH CONTRAST TECHNIQUE: Multidetector CT imaging of the abdomen and pelvis was performed using the  standard protocol following bolus administration of intravenous contrast. RADIATION DOSE REDUCTION: This exam was performed according to the departmental dose-optimization program which includes automated exposure control, adjustment of the mA and/or kV according to patient size and/or use of iterative reconstruction technique. CONTRAST:  50mL OMNIPAQUE IOHEXOL 350 MG/ML SOLN COMPARISON:  01/24/2022 FINDINGS: Lower chest: Lung bases show mild left basilar atelectasis. No focal infiltrate or effusion is seen. Hepatobiliary: Liver is well visualized and within normal limits. Gallbladder is well distended with some dependent density consistent with small gallstones. Wall thickening and likely wall edema is noted. No obstructive changes are seen. Pancreas: Unremarkable. No pancreatic ductal dilatation or surrounding inflammatory changes.  Spleen: Normal in size without focal abnormality. Adrenals/Urinary Tract: Adrenal glands are within normal limits. Kidneys are well visualized bilaterally within normal enhancement pattern. No renal calculi are noted. Simple appearing right renal cyst is noted measuring 2.8 cm. This is similar to that seen on the prior exam. No further follow-up is recommended. Smaller left renal cyst is noted also stable from the prior study. No obstructive changes are seen. The bladder is well distended. Stomach/Bowel: Scattered diverticular change of the colon is noted without evidence of diverticulitis. The appendix is within normal limits. No obstructive changes of the colon are seen. Small bowel and stomach are within normal limits. Vascular/Lymphatic: Aortic atherosclerosis. No enlarged abdominal or pelvic lymph nodes. Reproductive: Uterus and bilateral adnexa are unremarkable. Other: No abdominal wall hernia or abnormality. No abdominopelvic ascites. Musculoskeletal: Mild degenerative changes of lumbar spine are noted with anterolisthesis of L4 on L5. IMPRESSION: Cholelithiasis with evidence of  gallbladder wall thickening and likely wall edema. Right upper quadrant ultrasound may be helpful for further evaluation as this may represent acute cholecystitis. Diverticulosis without diverticulitis. Simple renal cysts bilaterally stable from prior exam. No further follow-up is recommended. Electronically Signed   By: Alcide Clever M.D.   On: 08/07/2022 20:33   DG Chest Portable 1 View  Result Date: 08/07/2022 CLINICAL DATA:  Epigastric pain EXAM: PORTABLE CHEST 1 VIEW COMPARISON:  Chest x-ray 01/29/2022 FINDINGS: The heart size and mediastinal contours are within normal limits. Both lungs are clear. There is blunting of the left costophrenic angle. There is no pneumothorax. The visualized skeletal structures are unremarkable. IMPRESSION: Blunting of the left costophrenic angle may be due to pleural thickening or small pleural effusion. Electronically Signed   By: Darliss Cheney M.D.   On: 08/07/2022 18:01    Cardiac Studies   Echo 01/25/22: 1. Left ventricular ejection fraction, by estimation, is 50 to 55%. The  left ventricle has low normal function. The left ventricle has no regional  wall motion abnormalities. Left ventricular diastolic parameters are  consistent with Grade II diastolic  dysfunction (pseudonormalization).   2. Right ventricular systolic function is normal. The right ventricular  size is normal. There is normal pulmonary artery systolic pressure. The  estimated right ventricular systolic pressure is 34.7 mmHg.   3. Left atrial size was moderately dilated.   4. The mitral valve is degenerative. Mild mitral valve regurgitation. The  mean mitral valve gradient is 4.0 mmHg with average heart rate of 77 bpm.   5. The aortic valve is tricuspid. There is mild calcification of the  aortic valve. There is mild thickening of the aortic valve. Aortic valve  regurgitation is not visualized. Aortic valve sclerosis is present, with  no evidence of aortic valve stenosis.   6. The  inferior vena cava is dilated in size with <50% respiratory  variability, suggesting right atrial pressure of 15 mmHg.     Patient Profile     89 y.o. female PAD, HLD, DM, PAF, PPM for 25 sec pause explanted due to infection chest pain.  Currently being admitted for cholecystectomy.  Assessment & Plan   Paroxysmal atrial fibrillation Currently in normal sinus rhythm.  Not anticoagulated due to high fall risk.  If converts to atrial fibrillation with concurrent illness can consider IV amiodarone with heparin drip. 01/2022 Echocardiogram shows LVEF of 50-55% with grade 2 diastolic dysfunction. Continue p.o. amiodarone 100mg   Hx of 25-sec pause PPM explanted due to infection Monitor on telemetry during surgery Avoid BB, no pauses on  telemetry  CAD Coronary CTA shows left main disease that has been stable.  Denies any anginal symptoms.  Previously not felt to be a candidate for cardiac catheterization. Continue atorvastatin 20 mg daily  Hypertension Currently on amlodipine 10 mg daily.  Slightly hypertensive here during admission.  This is reasonable considering concomitant infection.  Would not aggressively chase.  Patient has cholecystectomy scheduled for today.  No further optimization needed at this point. Patient appears stable and ready for procedure.  Euvolemic on exam   For questions or updates, please contact [facility] Please consult [email or web address] for contact info under        Signed, Abagail Kitchens, PA-C  08/09/2022, 9:57 AM    Attending Note:   The patient was seen and examined.  Agree with assessment and plan as noted above.  Changes made to the above note as needed.  Patient seen and independently examined with [facility] .   We discussed all aspects of the encounter. I agree with the assessment and plan as stated above.    Pre op assessment :   patient  is optimized for her upcoming surgery ,  no additional testing needed.   2.  PAF :  not on  anticoagulation due to hx of falls   3.   CAD :  no angina    I have spent a total of 40 minutes with patient reviewing hospital  notes , telemetry, EKGs, labs and examining patient as well as establishing an assessment and plan that was discussed with the patient.  > 50% of time was spent in direct patient care.    Vesta Mixer, Montez Hageman., MD, [facility] 08/09/2022, 1:07 PM [address],  [address] Office [phone] Pager [phone]

## 2022-08-09 NOTE — Anesthesia Procedure Notes (Signed)
Procedure Name: Intubation Date/Time: 08/09/2022 4:38 PM  Performed by: Alwyn Ren, CRNAPre-anesthesia Checklist: Patient identified, Emergency Drugs available, Suction available and Patient being monitored Patient Re-evaluated:Patient Re-evaluated prior to induction Oxygen Delivery Method: Circle system utilized Preoxygenation: Pre-oxygenation with 100% oxygen Induction Type: IV induction Ventilation: Mask ventilation without difficulty Laryngoscope Size: Miller and 2 Grade View: Grade I Tube type: Oral Tube size: 7.0 mm Number of attempts: 1 Airway Equipment and Method: Stylet and Oral airway Placement Confirmation: ETT inserted through vocal cords under direct vision, positive ETCO2 and breath sounds checked- equal and bilateral Secured at: 20 cm Tube secured with: Tape Dental Injury: Teeth and Oropharynx as per pre-operative assessment

## 2022-08-09 NOTE — Op Note (Signed)
Patient: Shelby Walker ([birth year], [id number])  Date of Surgery: 08/07/2022 - 08/09/2022   Preoperative Diagnosis: pancreatitis   Postoperative Diagnosis: pancreatitis   Surgical Procedure: LAPAROSCOPIC CHOLECYSTECTOMY WITH INTRAOPERATIVE CHOLANGIOGRAM: 21308 (CPT)   Operative Team Members:  Surgeon(s) and Role:    * Quanasia Defino, Hyman Hopes, MD - Primary   Anesthesiologist: Jairo Ben, MD CRNA: Alwyn Ren, CRNA; Garfield Cornea, CRNA   Anesthesia: General   Fluids:  Total I/O In: 1515.4 [I.V.:1225.2; IV Piggyback:290.2] Out: 500 [Urine:500]  Complications: * No complications entered in OR log *  Drains:  none   Specimen:  ID Type Source Tests Collected by Time Destination  1 : Gallbladder Tissue PATH Gallbladder SURGICAL PATHOLOGY Breccan Galant, Hyman Hopes, MD 08/09/2022 1655      Disposition:  PACU - hemodynamically stable.  Plan of Care:  continue inpatient care    Indications for Procedure: Shelby Walker is a 89 y.o. female who presented with abdominal pain.  History, physical and imaging was concerning for gallstone pancreatitis.  Laparoscopic cholecystectomy was recommended for the patient.  The procedure itself, as well as the risks, benefits and alternatives were discussed with the patient.  Risks discussed included but were not limited to the risk of infection, bleeding, damage to nearby structures, need to convert to open procedure, incisional hernia, bile leak, common bile duct injury and the need for additional procedures or surgeries.  With this discussion complete and all questions answered the patient granted consent to proceed.  Findings: Gallstones  Infection status: Patient: Shelby Walker Emergency General Surgery Service Patient Case: Urgent Infection Present At Time Of Surgery (PATOS):  Some spillage of bile related to performing cholangiogram   Description of Procedure:   On the date stated above, the patient was taken to the operating room suite  and placed in supine positioning.  Sequential compression devices were placed on the lower extremities to prevent blood clots.  General endotracheal anesthesia was induced. Preoperative antibiotics were given.  The patient's abdomen was prepped and draped in the usual sterile fashion.  A time-out was completed verifying the correct patient, procedure, positioning and equipment needed for the case.  We began by anesthetizing the skin with local anesthetic and then making a 5 mm incision just below the umbilicus.  We dissected through the subcutaneous tissues to the fascia.  The fascia was grasped and elevated using a Kocher clamp.  A Veress needle was inserted into the abdomen and the abdomen was insufflated to 15 mmHg.  A 5 mm trocar was inserted in this position under optical guidance and then the abdomen was inspected.  There was no trauma to the underlying viscera with initial trocar placement.  Any abnormal findings, other than inflammation in the right upper quadrant, are listed above in the findings section.  Three additional trocars were placed, one 12 mm trocar in the subxiphoid position, one 5 mm trocar in the midline epigastric area and one 5mm trocar in the right upper quadrant subcostally.  These were placed under direct vision without any trauma to the underlying viscera.    The patient was then placed in head up, left side down positioning.  The gallbladder was identified and dissected free from its attachments to the omentum allowing the duodenum to fall away.  The infundibulum of the gallbladder was dissected free working laterally to medially.  The cystic duct and cystic artery were dissected free from surrounding connective tissue.  The infundibulum of the gallbladder was dissected off the cystic plate.  A critical view of safety was obtained with the cystic duct and cystic artery being cleared of connective tissues and clearly the only two structures entering into the gallbladder with the  liver clearly visible behind.  One clip was applied high on the cystic duct.  A small ductotomy was created below this using the endoscopic shears.  A cholangiogram catheter was introduced through the abdominal wall and into the cystic duct through this ductotomy.  The catheter was clipped into position.  The catheter was flushed to ensure no leakage around the clip.  We then removed the laparoscopic instruments and positioned the C-Arm to perform a cholangiogram.  The catheter was flushed with contrast under fluoroscopic visualization and a cholangiogram was obtained.  The cholangiogram visualized the biliary tree from the ampulla up to the first two biliary radicals in the liver.  There were no filling defects identified.  The catheter clearly entered the cystic duct.  There was gradual tapering of the common bile duct down to the ampulla without evidence of stricture or other abnormalities.  Please see the EMR for saved representative images.  With our cholangiogram compete, we moved the c-arm away from the field and returned to laparoscopic surgery.    Clips were then applied to the cystic duct and cystic artery and then these structures were divided.  A PDS endoloop was placed to secure the cystic duct.  The gallbladder was dissected off the cystic plate, placed in an endocatch bag and removed from the 12 mm subxiphoid port site.  The clips were inspected and appeared effective.  The cystic plate was inspected and hemostasis was obtained using electrocautery.  A suction irrigator was used to clean the operative field. Snow topical hemostatic agent was placed in gallbladder fossa. Attention was turned to closure.  The 12 mm subxiphoid port site was closed using a 0-vicryl suture on a fascial suture passer.  The abdomen was desufflated.  The skin was closed using 4-0 monocryl and dermabond.  All sponge and needle counts were correct at the conclusion of the case.    Ivar Drape, MD General,  Bariatric, & Minimally Invasive Surgery [facility], [place]

## 2022-08-09 NOTE — Transfer of Care (Signed)
Immediate Anesthesia Transfer of Care Note  Patient: Shelby Walker  Procedure(s) Performed: LAPAROSCOPIC CHOLECYSTECTOMY WITH INTRAOPERATIVE CHOLANGIOGRAM (Abdomen)  Patient Location: PACU  Anesthesia Type:General  Level of Consciousness: drowsy  Airway & Oxygen Therapy: Patient Spontanous Breathing  Post-op Assessment: Report given to RN and Post -op Vital signs reviewed and stable  Post vital signs: Reviewed and stable  Last Vitals:  Vitals Value Taken Time  BP 128/54   Temp    Pulse 92 08/09/22 1733  Resp 10 08/09/22 1733  SpO2 92 % 08/09/22 1733  Vitals shown include unvalidated device data.  Last Pain:  Vitals:   08/09/22 1520  TempSrc:   PainSc: 3          Complications: No notable events documented.

## 2022-08-09 NOTE — TOC Initial Note (Addendum)
Transition of Care [facility]) - Initial/Assessment Note    Patient Details  Name: Shelby Walker MRN: [id number] Date of Birth: May 11, 1933  Transition of Care [facility]) CM/SW Contact:    Shelby Clement, RN Phone Number: 08/09/2022, 11:58 AM  Clinical Narrative:    CM met with Patient and family bedside.  Patient is from home and lives alone.  Family support available with her Daughter, [place] Walker [phone]) Son and Son in [place].   Patient owns a BSC, (recommended) Shower chair, walker, rollator, and has grab bars installed.  Patient is being recommended  HHPT and OT and has chosen [facility] as she has used them in the past. [place] [facility] [phone]) has been contacted.   Family will transport at [place].    TOC will continue to follow patient for any additional discharge needs                Expected Discharge Plan: Home w Home Health Services Barriers to Discharge: Continued Medical Work up   Patient Goals and CMS Choice Patient states their goals for this hospitalization and ongoing recovery are:: go home ! CMS Medicare.gov Compare Post Acute Care list provided to:: Other (Comment Required) (N/A  Stormy Card for [facility] No choice needed) Choice offered to / list presented to : NA      Expected Discharge Plan and Services     Post Acute Care Choice: Home Health Living arrangements for the past 2 months: Single Family Home                 DME Arranged: N/A DME Agency: NA       HH Arranged: PT, OT HH Agency: [facility] Date HH Agency Contacted: 08/09/22 Time HH Agency Contacted: 1155 Representative spoke with at [facility] Agency: [place] Walker  Prior Living Arrangements/Services Living arrangements for the past 2 months: Single Family Home Lives with:: Self Patient language and need for interpreter reviewed:: Yes Do you feel safe going back to the place where you live?: Yes      Need for Family Participation in Patient Care: Yes (Comment) Care giver support system in place?: Yes  (comment) Current home services: DME (has walker,rollator,shower bench, grab bars, habd reails and a BSC) Criminal Activity/Legal Involvement Pertinent to Current Situation/Hospitalization: No - Comment as needed  Activities of Daily Living Home Assistive Devices/Equipment: Walker (specify type) ADL Screening (condition at time of admission) Patient's cognitive ability adequate to safely complete daily activities?: Yes Is the patient deaf or have difficulty hearing?: Yes Does the patient have difficulty seeing, even when wearing glasses/contacts?: No Does the patient have difficulty concentrating, remembering, or making decisions?: Yes Patient able to express need for assistance with ADLs?: Yes Does the patient have difficulty dressing or bathing?: Yes Independently performs ADLs?: Yes (appropriate for developmental age) Does the patient have difficulty walking or climbing stairs?: Yes Weakness of Legs: Both Weakness of Arms/Hands: Both  Permission Sought/Granted Permission sought to share information with : Other (comment) [facility] Provider) Permission granted to share information with : Yes, Verbal Permission Granted     Permission granted to share info w AGENCY: [facility]        Emotional Assessment Appearance:: Appears stated age Attitude/Demeanor/Rapport: Engaged, Gracious Affect (typically observed): Pleasant, Accepting Orientation: : Oriented to Self, Oriented to Place, Oriented to  Time, Oriented to Situation Alcohol / Substance Use: Not Applicable Psych Involvement: No (comment)  Admission diagnosis:  Acute biliary pancreatitis [K85.10] Acute biliary pancreatitis, unspecified complication status [K85.10] Patient  Active Problem List   Diagnosis Date Noted   Acute biliary pancreatitis 08/07/2022   Sepsis (HCC) 08/07/2022   Sacral wound 05/14/2022   Coronary artery disease involving native coronary artery of native heart without angina pectoris 02/21/2022    Discitis of lumbar region 02/05/2022   Herpes zoster without complication 02/05/2022   Clostridium difficile colitis 02/05/2022   Severe sepsis with acute organ dysfunction due to Gram positive bacteria (HCC) 02/02/2022   Pacemaker infection (HCC) 02/01/2022   Periostitis of lumbar spine without osteomyelitis (HCC) 02/01/2022   Acute midline low back pain without sciatica 01/26/2022   MSSA bacteremia 01/25/2022   Presence of cardiac pacemaker 01/25/2022   Severe sepsis with septic shock (HCC) 01/24/2022   AKI (acute kidney injury) (HCC) 01/24/2022   Sinus pause 01/04/2022   Acute viral bronchitis 02/06/2021   Acute blood loss anemia    Postoperative pain    S/P transmetatarsal amputation of foot, right (HCC) 01/24/2021   Protein-calorie malnutrition, severe 01/17/2021   Diabetic foot infection (HCC) 01/16/2021   Rheumatoid arthritis (HCC) 01/16/2021   Stage 3b chronic kidney disease (CKD) (HCC) 01/16/2021   DNR (do not resuscitate) 01/16/2021   Decreased anal sphincter tone 12/13/2018   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   Irritable bowel syndrome with diarrhea - post infectious 12/12/2018   Full incontinence of feces and fecal smearing 12/12/2018   Perianal dermatitis 12/12/2018   Uncontrolled type 2 diabetes mellitus with hyperglycemia (HCC) 10/26/2018   Long-term use of immunosuppressant medication-Enbrel 10/26/2018   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 08/27/2018   Thrombocytopenia (HCC)    Essential hypertension    Dyspnea on exertion    Steroid-induced hyperglycemia    Fatigue 08/06/2018   Paroxysmal atrial fibrillation (HCC)    Mixed hyperlipidemia 09/29/2016   Stable angina 09/29/2016   PAD (peripheral artery disease) (HCC) 08/09/2016   Preoperative clearance 05/21/2012   Precordial chest pain    Palpitations    Diabetes mellitus (HCC)    GERD (gastroesophageal reflux disease)    Ejection fraction    Carotid artery disease (HCC)    Mitral regurgitation  04/21/2009   PSORIASIS 02/09/2009   Rheumatoid arthritis flare (HCC) 02/09/2009   PCP:  Adrian Prince, MD Pharmacy:   [id number] - [place], [place] - [address] [place] [address] Phone: [phone] Fax: [phone]  [facility] [address], [place] - [address] [place] [address] Phone: [phone] Fax: [phone]     Social Determinants of Health (SDOH) Social History: SDOH Screenings   Food Insecurity: No Food Insecurity (08/08/2022)  Housing: Low Risk  (08/08/2022)  Transportation Needs: No Transportation Needs (08/08/2022)  Utilities: Not At Risk (08/08/2022)  Depression (PHQ2-9): Low Risk  (05/28/2022)  Financial Resource Strain: Low Risk  (07/24/2018)  Physical Activity: Sufficiently Active (07/24/2018)  Social Connections: Socially Integrated (07/24/2018)  Stress: No Stress Concern Present (07/24/2018)  Tobacco Use: Low Risk  (08/07/2022)   SDOH Interventions:     Readmission Risk Interventions    02/02/2022    1:25 PM  Readmission Risk Prevention Plan  HRI or Home Care Consult Complete  Social Work Consult for Recovery Care Planning/Counseling Complete  Palliative Care Screening Not Applicable  Medication Review Oceanographer) Complete

## 2022-08-09 NOTE — Anesthesia Postprocedure Evaluation (Signed)
Anesthesia Post Note  Patient: [place] Walker  Procedure(s) Performed: LAPAROSCOPIC CHOLECYSTECTOMY WITH INTRAOPERATIVE CHOLANGIOGRAM (Abdomen)     Patient location during evaluation: PACU Anesthesia Type: General Level of consciousness: patient cooperative, oriented and sedated Pain management: pain level controlled (pain improving) Vital Signs Assessment: post-procedure vital signs reviewed and stable Respiratory status: spontaneous breathing, nonlabored ventilation and respiratory function stable Cardiovascular status: blood pressure returned to baseline and stable Postop Assessment: no apparent nausea or vomiting Anesthetic complications: no   No notable events documented.  Last Vitals:  Vitals:   08/09/22 1745 08/09/22 1800  BP: (!) 127/46 (!) 123/47  Pulse: 81 85  Resp: 12 13  Temp:    SpO2: 94% 92%    Last Pain:  Vitals:   08/09/22 1800  TempSrc:   PainSc: Asleep                 Breyden Jeudy,E. Eutimio Gharibian

## 2022-08-09 NOTE — Progress Notes (Signed)
Physical Therapy Treatment Patient Details Name: ONESHIA Walker MRN: [id number] DOB: 26-Jul-1933 Today's Date: 08/09/2022   History of Present Illness 89 y/o F admitted to [facility] on 6/4 for epigastric pain, nausea, vomiting, and shaking chills. CT of abdomen shows cholelithiasis with evidence of gallbladder wall thickening, RUQ ultrasound without evidence of acute cholecystitis but gallbladder sludge and cholelithiasis. Plans for lap chole 6/6. PMHx: RA, DM, CKD 3B, mitral regurgitation, PAF, pacemaker implantation removed due to MSSA bacteremia, recurrent c diff    PT Comments    Pt greeted resting in bed and agreeable to session with continued progress towards acute goals. Pt requiring grossly min A down to min guard assist for bed mobility, functional transfers and gait with RW for support. Pt able to progress gait for hallway distance this session without LOB, with cues for forward gaze and increased time needed to negotiate in tight spaces and around obstacles. Pt with multiple family members at bedside, all supportive and pt pleasant and participatory throughout. Current plan remains appropriate to address deficits and maximize functional independence and decrease caregiver burden. Pt continues to benefit from skilled PT services to progress toward functional mobility goals.    Recommendations for follow up therapy are one component of a multi-disciplinary discharge planning process, led by the attending physician.  Recommendations may be updated based on patient status, additional functional criteria and insurance authorization.  Follow Up Recommendations       Assistance Recommended at Discharge Frequent or constant Supervision/Assistance  Patient can return home with the following A little help with walking and/or transfers;Help with stairs or ramp for entrance;Assist for transportation;Direct supervision/assist for medications management;Assistance with cooking/housework;A little help with  bathing/dressing/bathroom   Equipment Recommendations  None recommended by PT    Recommendations for Other Services       Precautions / Restrictions Precautions Precautions: Fall;Other (comment) Precaution Comments: watch O2, urinary and bowel incontinence Restrictions Weight Bearing Restrictions: No     Mobility  Bed Mobility Overal bed mobility: Needs Assistance Bed Mobility: Supine to Sit, Sit to Supine     Supine to sit: Min guard, HOB elevated     General bed mobility comments: increased time and use of bed rail, mild cueing for proper technique    Transfers Overall transfer level: Needs assistance Equipment used: Rolling walker (2 wheels) Transfers: Sit to/from Stand, Bed to chair/wheelchair/BSC Sit to Stand: Min assist, Min guard           General transfer comment: minA progressing to minG with sit<>stand transfers. able to rise from low commode without physical assist    Ambulation/Gait Ambulation/Gait assistance: Min guard Gait Distance (Feet): 175 Feet Assistive device: Rolling walker (2 wheels) Gait Pattern/deviations: Step-to pattern, Decreased stride length, Trunk flexed Gait velocity: decreased     General Gait Details: min guard with RW support, cues for upright posture and forward gaze, some difficulty negoititating tight spaces in hall, but able to problem solve with increased time   Stairs             Wheelchair Mobility    Modified Rankin (Stroke Patients Only)       Balance Overall balance assessment: Needs assistance Sitting-balance support: No upper extremity supported, Feet supported Sitting balance-Leahy Scale: Fair     Standing balance support: Bilateral upper extremity supported, During functional activity, Reliant on assistive device for balance Standing balance-Leahy Scale: Poor Standing balance comment: Pt reliant on RW for balance in standing and transfers.  Cognition  Arousal/Alertness: Awake/alert Behavior During Therapy: WFL for tasks assessed/performed Overall Cognitive Status: Within Functional Limits for tasks assessed                                 General Comments: AAOx4, Pt pleasant throughout session. multiple family memebers at bedside throughout session without concern for cognition.        Exercises      General Comments General comments (skin integrity, edema, etc.): VSS on RA      Pertinent Vitals/Pain Pain Assessment Pain Assessment: No/denies pain Faces Pain Scale: No hurt    Home Living                          Prior Function            PT Goals (current goals can now be found in the care plan section) Acute Rehab PT Goals Patient Stated Goal: get better and go home PT Goal Formulation: With patient/family Time For Goal Achievement: 08/22/22 Progress towards PT goals: Progressing toward goals    Frequency    Min 3X/week      PT Plan      Co-evaluation              AM-PAC PT "6 Clicks" Mobility   Outcome Measure  Help needed turning from your back to your side while in a flat bed without using bedrails?: A Little Help needed moving from lying on your back to sitting on the side of a flat bed without using bedrails?: A Little Help needed moving to and from a bed to a chair (including a wheelchair)?: A Little Help needed standing up from a chair using your arms (e.g., wheelchair or bedside chair)?: A Little Help needed to walk in hospital room?: A Little Help needed climbing 3-5 steps with a railing? : A Lot 6 Click Score: 17    End of Session Equipment Utilized During Treatment: Gait belt Activity Tolerance: Patient tolerated treatment well Patient left: in bed;with call bell/phone within reach;with family/visitor present (seated EOB) Nurse Communication: Mobility status PT Visit Diagnosis: Unsteadiness on feet (R26.81);Muscle weakness (generalized) (M62.81);Other  abnormalities of gait and mobility (R26.89)     Time: 6045-4098 PT Time Calculation (min) (ACUTE ONLY): 23 min  Charges:  $Gait Training: 8-22 mins $Therapeutic Activity: 8-22 mins                     Baird Polinski R. PTA Acute Rehabilitation Services Office: [phone]   Catalina Antigua 08/09/2022, 11:57 AM

## 2022-08-09 NOTE — Progress Notes (Signed)
PROGRESS NOTE                                                                                                                                                                                                             Patient Demographics:    Shelby Walker, is a 89 y.o. female, DOB - [birth year], WUJ:[id number]  Outpatient Primary MD for the patient is Adrian Prince, MD    LOS - 2  Admit date - 08/07/2022    Chief Complaint  Patient presents with   Chest Pain       Brief Narrative (HPI from H&P)    89 y.o. 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  Her workup was consistent with gallstone pancreatitis with possible ascending cholangitis, general surgery was consulted and she was admitted to the hospital.   Subjective:   Patient in bed, appears comfortable, denies any headache, no fever, no chest pain or pressure, no shortness of breath , no abdominal pain. No focal weakness.   Assessment  & Plan :   1. Acute biliary pancreatitis -with possible ascending cholangitis and sepsis.  She is extremely tenuous, general surgery on board, GI cardiology also on board.  She has been kept on bowel rest, IV fluids, bolus as needed, stress dose steroids as she is chronically on steroids for rheumatoid arthritis, empiric IV antibiotics.  She is a high risk candidate for any operative procedure, she, her daughter and son completely understand this.  They accept the risk and would like to proceed with any surgical procedure that is required.  Patient is agreeable for blood transfusion if needed during perioperative period, okay  for short-term ventilator if needed in the OR or PACU.  Her LFTs are improving exam is stable, likely had a CBD stone which has passed.  Await surgery on Aug 27, 2022.   2. Sepsis   - Fever with rigors, mild tachycardia, SBP as low as 94, and lactate 3.5 in ED, ascending cholangitis follow cultures, IV fluids and IV Zosyn.  General surgery & GI on board.  Sepsis pathophysiology is improving.    3. Type  II DM  - A1c was 7.3% in November 2023, Check CBGs and use low-intensity SSI for now     CBG (last 3)  Recent Labs    08/08/22 2035 08/09/22 0503 08/09/22 0905  GLUCAP 182* 175* 132*     4.  AKI on CKD 3B with borderline hyperkalemia. - SCr is 1.45 on admission, hydrate, renal ultrasound, Foley catheter if needed, Lokelma and monitor.    5. PAF  - No longer anticoagulated d/t fall risk, Continue amiodarone, recent echocardiogram from few months ago noted with preserved EF, EKG here stable.  No chest pain at rest, cardiology to also evaluate, her heart conditions should not prohibit her from going to surgery.   6. Rheumatoid arthritis  - Managed with prednisone 5 mg daily placed with stress dose steroids.        Condition - Extremely Guarded  Family Communication  : Both daughter and son updated in detail on 08/08/2022, they will understand that patient is extremely tenuous, she will require cholecystectomy, they understand that she is a high risk candidate for any surgical procedure.  They understand this risk and want to proceed with surgery.  Multiple family members updated at bedside on 08/09/2022.  Code Status : DNR  Consults  : General surgery, GI, cardiology  PUD Prophylaxis : PPI   Procedures  :     Recent echocardiogram in November 2023.  -      1. Left ventricular ejection fraction, by estimation, is 50 to 55%. The left ventricle has low normal function.   2. Right ventricular systolic function is normal. The right ventricular size is normal. There is normal pulmonary artery  systolic pressure.   3. Left atrial size was moderately dilated. No left atrial/left atrial appendage thrombus was detected.   4. The mitral valve is degenerative. Mild mitral valve regurgitation. No evidence of mitral stenosis.   5. The aortic valve is tricuspid. There is mild calcification of the aortic valve. There is mild thickening of the aortic valve. Aortic valve regurgitation is not visualized. Aortic valve sclerosis is present, with no evidence of aortic valve stenosis.   6. There is mild (Grade II) layered plaque involving the descending aorta and aortic arch.   EKG NSR, no acute ST changes.    RUQ [place] - 1. Cholelithiasis and/or gallbladder sludge, without evidence of acute cholecystitis. 2. Simple right renal cyst  CT -  Cholelithiasis with evidence of gallbladder wall thickening and likely wall edema. Right upper quadrant ultrasound may be helpful for further evaluation as this may represent acute cholecystitis. Diverticulosis without diverticulitis. Simple renal cysts bilaterally stable from prior exam. No further follow-up is recommended      Disposition Plan  :    Status is: Inpatient   DVT Prophylaxis  :    heparin injection 5,000 Units Start: 08/07/22 2215    Lab Results  Component Value Date   PLT 133 (L) 08/09/2022    Diet :  Diet Order             Diet NPO time specified Except for: Sips with Meds, Ice Chips  Diet effective now                    Inpatient Medications  Scheduled Meds:  amiodarone  100 mg Oral Daily   amLODipine  10 mg Oral Daily   atorvastatin  20 mg Oral Daily   cycloSPORINE  1 drop Both Eyes BID   diclofenac Sodium  4  g Topical TID AC & HS   gabapentin  200 mg Oral TID   heparin  5,000 Units Subcutaneous Q8H   hydrocortisone sod succinate (SOLU-CORTEF) inj  50 mg Intravenous Q12H   insulin aspart  0-6 Units Subcutaneous Q4H   mirtazapine  7.5 mg Oral QHS   pantoprazole  40 mg Oral Daily   sodium chloride flush  3 mL  Intravenous Q12H   Continuous Infusions:  ceFEPime (MAXIPIME) IV 200 mL/hr at 08/08/22 1600   lactated ringers 75 mL/hr at 08/09/22 0545   metronidazole 500 mg (08/09/22 0940)   PRN Meds:.acetaminophen **OR** acetaminophen, fentaNYL (SUBLIMAZE) injection, methocarbamol, ondansetron **OR** ondansetron (ZOFRAN) IV  Antibiotics  :    Anti-infectives (From admission, onward)    Start     Dose/Rate Route Frequency Ordered Stop   08/08/22 2200  metroNIDAZOLE (FLAGYL) IVPB 500 mg        500 mg 100 mL/hr over 60 Minutes Intravenous Every 12 hours 08/08/22 1504     08/08/22 2000  piperacillin-tazobactam (ZOSYN) IVPB 2.25 g  Status:  Discontinued        2.25 g 100 mL/hr over 30 Minutes Intravenous Every 6 hours 08/08/22 1418 08/08/22 1504   08/08/22 1600  ceFEPIme (MAXIPIME) 2 g in sodium chloride 0.9 % 100 mL IVPB        2 g 200 mL/hr over 30 Minutes Intravenous Every 24 hours 08/08/22 1504     08/08/22 0500  piperacillin-tazobactam (ZOSYN) IVPB 3.375 g  Status:  Discontinued        3.375 g 12.5 mL/hr over 240 Minutes Intravenous Every 8 hours 08/07/22 2230 08/08/22 1418   08/07/22 2030  piperacillin-tazobactam (ZOSYN) IVPB 3.375 g        3.375 g 100 mL/hr over 30 Minutes Intravenous  Once 08/07/22 2029 08/07/22 2125         Objective:   Vitals:   08/08/22 2000 08/08/22 2312 08/09/22 0311 08/09/22 0800  BP: (!) 137/51 (!) 151/60 (!) 178/68   Pulse: 67 65 63   Resp: 16 18 14    Temp: 97.6 F (36.4 C) 97.7 F (36.5 C) 97.8 F (36.6 C) 97.7 F (36.5 C)  TempSrc: Oral Oral Oral Oral  SpO2: 99% 99% 100%   Weight:      Height:        Wt Readings from Last 3 Encounters:  08/08/22 50.8 kg  07/23/22 51.2 kg  05/28/22 50.3 kg     Intake/Output Summary (Last 24 hours) at 08/09/2022 1020 Last data filed at 08/09/2022 0900 Gross per 24 hour  Intake 364.84 ml  Output 1900 ml  Net -1535.16 ml     Physical Exam  Awake Alert, No new F.N deficits, Normal  affect [place].AT,PERRAL Supple Neck, No JVD,   Symmetrical Chest wall movement, Good air movement bilaterally, CTAB RRR,No Gallops,Rubs or new Murmurs,  +ve B.Sounds, Abd Soft, No RUQ tenderness,   No Cyanosis, Clubbing or edema       Data Review:    Recent Labs  Lab 08/07/22 1710 08/08/22 0622 08/09/22 0714  WBC 9.6 9.3 7.8  HGB 9.2* 8.1* 8.4*  HCT 30.0* 25.4* 26.1*  PLT 194 159 133*  MCV 92.3 90.7 90.9  MCH 28.3 28.9 29.3  MCHC 30.7 31.9 32.2  RDW 16.1* 16.5* 16.2*  LYMPHSABS 0.6*  --  0.2*  MONOABS 0.2  --  0.0*  EOSABS 0.0  --  0.0  BASOSABS 0.0  --  0.2*    Recent Labs  Lab  08/07/22 1710 08/07/22 1915 08/07/22 2129 08/08/22 0620 08/08/22 0622 08/09/22 0714  NA 140  --   --   --  136 141  K 4.2  --   --   --  5.0 3.7  CL 107  --   --   --  102 107  CO2 21*  --   --   --  21* 21*  ANIONGAP 12  --   --   --  13 13  GLUCOSE 228*  --   --   --  167* 192*  BUN 63*  --   --   --  64* 55*  CREATININE 1.45*  --   --   --  2.00* 1.78*  AST 310*  --   --   --  4,600* 1,286*  ALT 149*  --   --   --  2,504* 1,424*  ALKPHOS 59  --   --   --  155* 136*  BILITOT 1.2  --   --   --  2.9* 1.7*  ALBUMIN 3.1*  --   --   --  2.6* 2.2*  CRP  --   --   --   --  7.2* 10.8*  PROCALCITON  --   --   --   --  51.50 34.93  LATICACIDVEN  --  3.5* 3.4*  --  1.6  --   HGBA1C  --   --   --  9.2*  --   --   BNP  --   --   --   --  365.7* 866.7*  MG  --   --   --   --  2.0 2.1  CALCIUM 8.5*  --   --   --  7.7* 7.5*      Recent Labs  Lab 08/07/22 1710 08/07/22 1915 08/07/22 2129 08/08/22 0620 08/08/22 0622 08/09/22 0714  CRP  --   --   --   --  7.2* 10.8*  PROCALCITON  --   --   --   --  51.50 34.93  LATICACIDVEN  --  3.5* 3.4*  --  1.6  --   HGBA1C  --   --   --  9.2*  --   --   BNP  --   --   --   --  365.7* 866.7*  MG  --   --   --   --  2.0 2.1  CALCIUM 8.5*  --   --   --  7.7* 7.5*    Radiology Reports US RENAL  Result Date: 08/08/2022 CLINICAL DATA:  Acute kidney  injury EXAM: RENAL / URINARY TRACT ULTRASOUND COMPLETE COMPARISON:  CT abdomen pelvis with contrast 08/07/2022 FINDINGS: Right Kidney: Renal measurements: 9.7 x 4.1 x 4.4 cm = volume: 93 mL. Mild increased echogenicity the renal cortex without significant thinning. No mass or hydronephrosis visualized. 2.7 cm simple cyst in the upper pole does not require dedicated imaging follow-up. Left Kidney: Renal measurements: 9.6 x 4.9 x 4.8 cm = volume: 120 mL. Mild increased echogenicity the renal cortex without significant thinning. No mass or hydronephrosis visualized. Bladder: Appears normal for degree of bladder distention. Other: None. IMPRESSION: Mild increased echogenicity of the renal cortices without significant thinning indicative of medical renal disease. Electronically Signed   By: Acquanetta Belling M.D.   On: 08/08/2022 11:31   DG Chest Port 1 View  Result Date: 08/08/2022 CLINICAL DATA:  Shortness of breath EXAM: PORTABLE CHEST 1  VIEW COMPARISON:  Yesterday FINDINGS: Hazy density at the left base, atelectasis or scarring. Streak normal heart size and mediastinal contours. There is no edema, consolidation, effusion, or pneumothorax. Which is IMPRESSION: Mild scarring/atelectasis at the left lung base. No change from prior. Electronically Signed   By: Tiburcio Pea M.D.   On: 08/08/2022 06:42   US Abdomen Limited RUQ (LIVER/GB)  Result Date: 08/07/2022 CLINICAL DATA:  Right upper quadrant pain. EXAM: ULTRASOUND ABDOMEN LIMITED RIGHT UPPER QUADRANT COMPARISON:  Aug 02, 2018 FINDINGS: Gallbladder: Small echogenic gallstones and/or gallbladder sludge is suspected within the region of the gallbladder fundus. There is no evidence of gallbladder wall thickening (2.5 mm) no sonographic Murphy sign noted by sonographer. Common bile duct: Diameter: 7.1 mm Liver: No focal lesion identified. Within normal limits in parenchymal echogenicity. Portal vein is patent on color Doppler imaging with normal direction of blood  flow towards the liver. Other: A 2.6 cm x 1.8 cm x 2.1 cm simple cyst is seen within the upper pole of the right kidney. IMPRESSION: 1. Cholelithiasis and/or gallbladder sludge, without evidence of acute cholecystitis. 2. Simple right renal cyst. Electronically Signed   By: Aram Candela M.D.   On: 08/07/2022 21:17   CT ABDOMEN PELVIS W CONTRAST  Result Date: 08/07/2022 CLINICAL DATA:  Epigastric pain EXAM: CT ABDOMEN AND PELVIS WITH CONTRAST TECHNIQUE: Multidetector CT imaging of the abdomen and pelvis was performed using the standard protocol following bolus administration of intravenous contrast. RADIATION DOSE REDUCTION: This exam was performed according to the departmental dose-optimization program which includes automated exposure control, adjustment of the mA and/or kV according to patient size and/or use of iterative reconstruction technique. CONTRAST:  50mL OMNIPAQUE IOHEXOL 350 MG/ML SOLN COMPARISON:  01/24/2022 FINDINGS: Lower chest: Lung bases show mild left basilar atelectasis. No focal infiltrate or effusion is seen. Hepatobiliary: Liver is well visualized and within normal limits. Gallbladder is well distended with some dependent density consistent with small gallstones. Wall thickening and likely wall edema is noted. No obstructive changes are seen. Pancreas: Unremarkable. No pancreatic ductal dilatation or surrounding inflammatory changes. Spleen: Normal in size without focal abnormality. Adrenals/Urinary Tract: Adrenal glands are within normal limits. Kidneys are well visualized bilaterally within normal enhancement pattern. No renal calculi are noted. Simple appearing right renal cyst is noted measuring 2.8 cm. This is similar to that seen on the prior exam. No further follow-up is recommended. Smaller left renal cyst is noted also stable from the prior study. No obstructive changes are seen. The bladder is well distended. Stomach/Bowel: Scattered diverticular change of the colon is noted  without evidence of diverticulitis. The appendix is within normal limits. No obstructive changes of the colon are seen. Small bowel and stomach are within normal limits. Vascular/Lymphatic: Aortic atherosclerosis. No enlarged abdominal or pelvic lymph nodes. Reproductive: Uterus and bilateral adnexa are unremarkable. Other: No abdominal wall hernia or abnormality. No abdominopelvic ascites. Musculoskeletal: Mild degenerative changes of lumbar spine are noted with anterolisthesis of L4 on L5. IMPRESSION: Cholelithiasis with evidence of gallbladder wall thickening and likely wall edema. Right upper quadrant ultrasound may be helpful for further evaluation as this may represent acute cholecystitis. Diverticulosis without diverticulitis. Simple renal cysts bilaterally stable from prior exam. No further follow-up is recommended. Electronically Signed   By: Alcide Clever M.D.   On: 08/07/2022 20:33   DG Chest Portable 1 View  Result Date: 08/07/2022 CLINICAL DATA:  Epigastric pain EXAM: PORTABLE CHEST 1 VIEW COMPARISON:  Chest x-ray 01/29/2022 FINDINGS: The heart size  and mediastinal contours are within normal limits. Both lungs are clear. There is blunting of the left costophrenic angle. There is no pneumothorax. The visualized skeletal structures are unremarkable. IMPRESSION: Blunting of the left costophrenic angle may be due to pleural thickening or small pleural effusion. Electronically Signed   By: Darliss Cheney M.D.   On: 08/07/2022 18:01      Signature  -   Susa Raring M.D on 08/09/2022 at 10:20 AM   -  To page go to [email or web address]

## 2022-08-09 NOTE — Anesthesia Preprocedure Evaluation (Addendum)
Anesthesia Evaluation  Patient identified by MRN, date of birth, ID band Patient awake    Reviewed: Allergy & Precautions, NPO status , Patient's Chart, lab work & pertinent test results  History of Anesthesia Complications (+) PONV  Airway Mallampati: I  TM Distance: >3 FB Neck ROM: Full    Dental  (+) Edentulous Upper, Edentulous Lower   Pulmonary neg pulmonary ROS   breath sounds clear to auscultation       Cardiovascular hypertension (no home meds), + CAD (calcification on CT)  + dysrhythmias Atrial Fibrillation + pacemaker (PPM for 25 sec pause explanted due to infection chest)  Rhythm:Regular Rate:Normal  01/2022 ECHO: EF 50-55%. 1.  The LV has low normal function.   2. RVF is normal. The right ventricular size is normal. There is normal pulmonary artery systolic pressure.   3. Left atrial size was moderately dilated. No left atrial/left atrial appendage thrombus was detected.   4. The mitral valve is degenerative. Mild MR. No evidence of mitral stenosis.   5. The aortic valve is tricuspid. There is mild calcification of the aortic valve. There is mild thickening of the aortic valve. Aortic valve regurgitation is not visualized. Aortic valve sclerosis is present, with no evidence of aortic valve stenosis.   6. There is mild (Grade II) layered plaque involving the descending aorta  and aortic arch.      Neuro/Psych negative neurological ROS     GI/Hepatic ,GERD  Controlled,,Markedly elevated LFTs pancreatitis   Endo/Other  diabetes (glu 165), Insulin Dependent    Renal/GU Renal InsufficiencyRenal disease     Musculoskeletal  (+) Arthritis ,    Abdominal   Peds  Hematology  (+) Blood dyscrasia (Hb 8.4, plt 133k), anemia   Anesthesia Other Findings   Reproductive/Obstetrics                             Anesthesia Physical Anesthesia Plan  ASA: 3  Anesthesia Plan: General    Post-op Pain Management:    Induction: Intravenous  PONV Risk Score and Plan: 4 or greater and Ondansetron, Dexamethasone and Treatment may vary due to age or medical condition  Airway Management Planned: Oral ETT  Additional Equipment: None  Intra-op Plan:   Post-operative Plan: Extubation in OR  Informed Consent: I have reviewed the patients History and Physical, chart, labs and discussed the procedure including the risks, benefits and alternatives for the proposed anesthesia with the patient or authorized representative who has indicated his/her understanding and acceptance.   Patient has DNR.  Discussed DNR with patient, Discussed DNR with power of attorney and Suspend DNR.     Plan Discussed with: CRNA and Surgeon  Anesthesia Plan Comments: (Pt and daughter agree to DNR suspension in the periop period)        Anesthesia Quick Evaluation

## 2022-08-09 NOTE — Progress Notes (Signed)
Progress Note: General Surgery Service   Chief Complaint/Subjective: Denies abdominal pain.  Chest pain feeling better.  Objective: Vital signs in last 24 hours: Temp:  [97.6 F (36.4 C)-97.8 F (36.6 C)] 97.8 F (36.6 C) (06/06 0311) Pulse Rate:  [63-90] 63 (06/06 0311) Resp:  [14-23] 14 (06/06 0311) BP: (117-178)/(46-68) 178/68 (06/06 0311) SpO2:  [90 %-100 %] 100 % (06/06 0311) Last BM Date : 08/08/22  Intake/Output from previous day: 06/05 0701 - 06/06 0700 In: 364.8 [I.V.:313.5; IV Piggyback:51.4] Out: 1700 [Urine:1700] Intake/Output this shift: No intake/output data recorded.  GI: Abd Soft, nontender; no palpable hepatosplenomegaly  Lab Results: CBC  Recent Labs    08/08/22 0622 08/09/22 0714  WBC 9.3 7.8  HGB 8.1* 8.4*  HCT 25.4* 26.1*  PLT 159 133*    BMET Recent Labs    08/08/22 0622 08/09/22 0714  NA 136 141  K 5.0 3.7  CL 102 107  CO2 21* 21*  GLUCOSE 167* 192*  BUN 64* 55*  CREATININE 2.00* 1.78*  CALCIUM 7.7* 7.5*    PT/INR No results for input(s): "LABPROT", "INR" in the last 72 hours. ABG No results for input(s): "PHART", "HCO3" in the last 72 hours.  Invalid input(s): "PCO2", "PO2"  Anti-infectives: Anti-infectives (From admission, onward)    Start     Dose/Rate Route Frequency Ordered Stop   08/08/22 2200  metroNIDAZOLE (FLAGYL) IVPB 500 mg        500 mg 100 mL/hr over 60 Minutes Intravenous Every 12 hours 08/08/22 1504     08/08/22 2000  piperacillin-tazobactam (ZOSYN) IVPB 2.25 g  Status:  Discontinued        2.25 g 100 mL/hr over 30 Minutes Intravenous Every 6 hours 08/08/22 1418 08/08/22 1504   08/08/22 1600  ceFEPIme (MAXIPIME) 2 g in sodium chloride 0.9 % 100 mL IVPB        2 g 200 mL/hr over 30 Minutes Intravenous Every 24 hours 08/08/22 1504     08/08/22 0500  piperacillin-tazobactam (ZOSYN) IVPB 3.375 g  Status:  Discontinued        3.375 g 12.5 mL/hr over 240 Minutes Intravenous Every 8 hours 08/07/22 2230 08/08/22  1418   08/07/22 2030  piperacillin-tazobactam (ZOSYN) IVPB 3.375 g        3.375 g 100 mL/hr over 30 Minutes Intravenous  Once 08/07/22 2029 08/07/22 2125       Medications: Scheduled Meds:  amiodarone  100 mg Oral Daily   amLODipine  10 mg Oral Daily   atorvastatin  20 mg Oral Daily   cycloSPORINE  1 drop Both Eyes BID   diclofenac Sodium  4 g Topical TID AC & HS   gabapentin  200 mg Oral TID   heparin  5,000 Units Subcutaneous Q8H   hydrocortisone sod succinate (SOLU-CORTEF) inj  50 mg Intravenous Q12H   insulin aspart  0-6 Units Subcutaneous Q4H   mirtazapine  7.5 mg Oral QHS   pantoprazole  40 mg Oral Daily   sodium chloride flush  3 mL Intravenous Q12H   Continuous Infusions:  ceFEPime (MAXIPIME) IV 200 mL/hr at 08/08/22 1600   lactated ringers 75 mL/hr at 08/09/22 0545   metronidazole 500 mg (08/08/22 2127)   PRN Meds:.acetaminophen **OR** acetaminophen, fentaNYL (SUBLIMAZE) injection, methocarbamol, ondansetron **OR** ondansetron (ZOFRAN) IV  Assessment/Plan: Ms. Zebell is a 89 year old female who presented with gallstone pancreatitis.  I explained the ~50 % risk of recurrent pancreatitis without cholecystectomy.  She stated she would rather have  her gallbladder removed than deal with that pain again.  Cardiology and medical teams weighted in on her moderate to high risk for surgery based on her co morbidities.  I recommend laparoscopic cholecystectomy with intraoperative cholangiogram.  I described the procedure, its risks, benefits and alternatives to the patient and her son at bedside.  After a full discussion and all questions answered the patient granted consent to proceed.  Will proceed to OR later today.   LOS: 2 days   FEN: NPO for bowel rest due to pancreatitis ID: None needed from surgery perspective, on zosyn VTE: Heparin, scds Foley: None Dispo: Continued care on floor    Quentin Ore, MD  [facility] Use [email or web address] to contact on  call provider  Daily Billing: [id number] - High MDM

## 2022-08-10 ENCOUNTER — Inpatient Hospital Stay (HOSPITAL_COMMUNITY): Payer: Medicare Other

## 2022-08-10 ENCOUNTER — Encounter (HOSPITAL_COMMUNITY): Payer: Self-pay | Admitting: Surgery

## 2022-08-10 DIAGNOSIS — I480 Paroxysmal atrial fibrillation: Secondary | ICD-10-CM | POA: Diagnosis not present

## 2022-08-10 DIAGNOSIS — A419 Sepsis, unspecified organism: Secondary | ICD-10-CM | POA: Diagnosis not present

## 2022-08-10 DIAGNOSIS — K851 Biliary acute pancreatitis without necrosis or infection: Secondary | ICD-10-CM | POA: Diagnosis not present

## 2022-08-10 DIAGNOSIS — N1832 Chronic kidney disease, stage 3b: Secondary | ICD-10-CM | POA: Diagnosis not present

## 2022-08-10 DIAGNOSIS — R7989 Other specified abnormal findings of blood chemistry: Secondary | ICD-10-CM

## 2022-08-10 LAB — CBC WITH DIFFERENTIAL/PLATELET
Abs Immature Granulocytes: 0.06 10*3/uL (ref 0.00–0.07)
Basophils Absolute: 0 10*3/uL (ref 0.0–0.1)
Basophils Relative: 0 %
Eosinophils Absolute: 0 10*3/uL (ref 0.0–0.5)
Eosinophils Relative: 0 %
HCT: 24.3 % — ABNORMAL LOW (ref 36.0–46.0)
Hemoglobin: 7.6 g/dL — ABNORMAL LOW (ref 12.0–15.0)
Immature Granulocytes: 1 %
Lymphocytes Relative: 3 %
Lymphs Abs: 0.2 10*3/uL — ABNORMAL LOW (ref 0.7–4.0)
MCH: 28.7 pg (ref 26.0–34.0)
MCHC: 31.3 g/dL (ref 30.0–36.0)
MCV: 91.7 fL (ref 80.0–100.0)
Monocytes Absolute: 0.1 10*3/uL (ref 0.1–1.0)
Monocytes Relative: 2 %
Neutro Abs: 6.9 10*3/uL (ref 1.7–7.7)
Neutrophils Relative %: 94 %
Platelets: 119 10*3/uL — ABNORMAL LOW (ref 150–400)
RBC: 2.65 MIL/uL — ABNORMAL LOW (ref 3.87–5.11)
RDW: 16.5 % — ABNORMAL HIGH (ref 11.5–15.5)
WBC: 7.3 10*3/uL (ref 4.0–10.5)
nRBC: 0 % (ref 0.0–0.2)

## 2022-08-10 LAB — C-REACTIVE PROTEIN: CRP: 8.5 mg/dL — ABNORMAL HIGH (ref <1.0)

## 2022-08-10 LAB — COMPREHENSIVE METABOLIC PANEL
ALT: 896 U/L — ABNORMAL HIGH (ref 0–44)
AST: 584 U/L — ABNORMAL HIGH (ref 15–41)
Albumin: 2.2 g/dL — ABNORMAL LOW (ref 3.5–5.0)
Alkaline Phosphatase: 136 U/L — ABNORMAL HIGH (ref 38–126)
Anion gap: 8 (ref 5–15)
BUN: 56 mg/dL — ABNORMAL HIGH (ref 8–23)
CO2: 23 mmol/L (ref 22–32)
Calcium: 7.3 mg/dL — ABNORMAL LOW (ref 8.9–10.3)
Chloride: 109 mmol/L (ref 98–111)
Creatinine, Ser: 1.78 mg/dL — ABNORMAL HIGH (ref 0.44–1.00)
GFR, Estimated: 27 mL/min — ABNORMAL LOW (ref >60)
Glucose, Bld: 295 mg/dL — ABNORMAL HIGH (ref 70–99)
Potassium: 3.4 mmol/L — ABNORMAL LOW (ref 3.5–5.1)
Sodium: 140 mmol/L (ref 135–145)
Total Bilirubin: 1 mg/dL (ref 0.3–1.2)
Total Protein: 4.7 g/dL — ABNORMAL LOW (ref 6.5–8.1)

## 2022-08-10 LAB — CULTURE, BLOOD (ROUTINE X 2): Special Requests: ADEQUATE

## 2022-08-10 LAB — PROCALCITONIN: Procalcitonin: 29.92 ng/mL

## 2022-08-10 LAB — GLUCOSE, CAPILLARY
Glucose-Capillary: 156 mg/dL — ABNORMAL HIGH (ref 70–99)
Glucose-Capillary: 193 mg/dL — ABNORMAL HIGH (ref 70–99)
Glucose-Capillary: 228 mg/dL — ABNORMAL HIGH (ref 70–99)
Glucose-Capillary: 254 mg/dL — ABNORMAL HIGH (ref 70–99)
Glucose-Capillary: 282 mg/dL — ABNORMAL HIGH (ref 70–99)
Glucose-Capillary: 296 mg/dL — ABNORMAL HIGH (ref 70–99)
Glucose-Capillary: 325 mg/dL — ABNORMAL HIGH (ref 70–99)

## 2022-08-10 LAB — MAGNESIUM: Magnesium: 2.3 mg/dL (ref 1.7–2.4)

## 2022-08-10 LAB — BRAIN NATRIURETIC PEPTIDE: B Natriuretic Peptide: 963.7 pg/mL — ABNORMAL HIGH (ref 0.0–100.0)

## 2022-08-10 MED ORDER — POTASSIUM CHLORIDE CRYS ER 20 MEQ PO TBCR
40.0000 meq | EXTENDED_RELEASE_TABLET | Freq: Once | ORAL | Status: AC
  Administered 2022-08-10: 40 meq via ORAL
  Filled 2022-08-10: qty 2

## 2022-08-10 NOTE — Progress Notes (Signed)
Progress Note  1 Day Post-Op  Subjective: Pt denies abdominal pain. May have passed flatus once or twice.   Objective: Vital signs in last 24 hours: Temp:  [97.7 F (36.5 C)-98.6 F (37 C)] 98.1 F (36.7 C) (06/07 0400) Pulse Rate:  [66-95] 68 (06/07 0400) Resp:  [12-20] 14 (06/07 0400) BP: (106-128)/(43-71) 109/46 (06/07 0400) SpO2:  [90 %-98 %] 90 % (06/07 0400) Last BM Date : 08/08/22  Intake/Output from previous day: 06/06 0701 - 06/07 0700 In: 1515.4 [I.V.:1225.2; IV Piggyback:290.2] Out: 655 [Urine:650; Blood:5] Intake/Output this shift: No intake/output data recorded.  PE: General: pleasant, WD, elderly female who is laying in bed in NAD HEENT: sclera anicteric  Lungs: Respiratory effort nonlabored Abd: soft, NT, mild distention, +BS, incisions C/D/I   Lab Results:  Recent Labs    08/09/22 0714 08/10/22 0622  WBC 7.8 7.3  HGB 8.4* 7.6*  HCT 26.1* 24.3*  PLT 133* 119*   BMET Recent Labs    08/09/22 0714 08/10/22 0622  NA 141 140  K 3.7 3.4*  CL 107 109  CO2 21* 23  GLUCOSE 192* 295*  BUN 55* 56*  CREATININE 1.78* 1.78*  CALCIUM 7.5* 7.3*   PT/INR No results for input(s): "LABPROT", "INR" in the last 72 hours. CMP     Component Value Date/Time   NA 140 08/10/2022 0622   NA 142 11/16/2021 1006   K 3.4 (L) 08/10/2022 0622   CL 109 08/10/2022 0622   CO2 23 08/10/2022 0622   GLUCOSE 295 (H) 08/10/2022 0622   BUN 56 (H) 08/10/2022 0622   BUN 48 (H) 11/16/2021 1006   CREATININE 1.78 (H) 08/10/2022 0622   CREATININE 1.58 (H) 05/01/2022 1011   CALCIUM 7.3 (L) 08/10/2022 0622   PROT 4.7 (L) 08/10/2022 0622   ALBUMIN 2.2 (L) 08/10/2022 0622   AST 584 (H) 08/10/2022 0622   ALT 896 (H) 08/10/2022 0622   ALKPHOS 136 (H) 08/10/2022 0622   BILITOT 1.0 08/10/2022 0622   GFRNONAA 27 (L) 08/10/2022 0622   GFRAA 48 (L) 09/09/2019 1630   Lipase     Component Value Date/Time   LIPASE 103 (H) 08/09/2022 0714       Studies/Results: DG  Cholangiogram Operative  Result Date: 08/10/2022 CLINICAL DATA:  [id number] Surgery, elective [id number] EXAM: INTRAOPERATIVE CHOLANGIOGRAM COMPARISON:  US Abdomen and CT AP, 08/07/2022. FLUOROSCOPY: Exposure Index (as provided by the fluoroscopic device): 4.4 mGy Kerma FINDINGS: limited oblique planar images of the RIGHT upper quadrant obtained C-arm. Images demonstrating laparoscopic instrumentation, cystic duct cannulation and antegrade cholangiogram. Spillage of contrast into the duodenum, however mild extrahepatic biliary dilatation with suspected small filling defects at the distal CBD about the ampulla. See key images. IMPRESSION: Fluoroscopic imaging for intraoperative cholangiogram. Question small biliary filling defects of the distal CBD about the ampulla, despite spillage of contrast into the duodenum. Findings suspicious for choledocholithiasis. If continued clinical concern consider MRCP and/or ERCP for further evaluation. Electronically Signed   By: Roanna Banning M.D.   On: 08/10/2022 07:26   US RENAL  Result Date: 08/08/2022 CLINICAL DATA:  Acute kidney injury EXAM: RENAL / URINARY TRACT ULTRASOUND COMPLETE COMPARISON:  CT abdomen pelvis with contrast 08/07/2022 FINDINGS: Right Kidney: Renal measurements: 9.7 x 4.1 x 4.4 cm = volume: 93 mL. Mild increased echogenicity the renal cortex without significant thinning. No mass or hydronephrosis visualized. 2.7 cm simple cyst in the upper pole does not require dedicated imaging follow-up. Left Kidney: Renal measurements: 9.6 x 4.9 x  4.8 cm = volume: 120 mL. Mild increased echogenicity the renal cortex without significant thinning. No mass or hydronephrosis visualized. Bladder: Appears normal for degree of bladder distention. Other: None. IMPRESSION: Mild increased echogenicity of the renal cortices without significant thinning indicative of medical renal disease. Electronically Signed   By: Acquanetta Belling M.D.   On: 08/08/2022 11:31     Anti-infectives: Anti-infectives (From admission, onward)    Start     Dose/Rate Route Frequency Ordered Stop   08/09/22 1505  ceFAZolin (ANCEF) 2-4 GM/100ML-% IVPB       Note to Pharmacy: [facility], [place]: cabinet override      08/09/22 1505 08/10/22 0314   08/08/22 2200  metroNIDAZOLE (FLAGYL) IVPB 500 mg        500 mg 100 mL/hr over 60 Minutes Intravenous Every 12 hours 08/08/22 1504     08/08/22 2000  piperacillin-tazobactam (ZOSYN) IVPB 2.25 g  Status:  Discontinued        2.25 g 100 mL/hr over 30 Minutes Intravenous Every 6 hours 08/08/22 1418 08/08/22 1504   08/08/22 1600  ceFEPIme (MAXIPIME) 2 g in sodium chloride 0.9 % 100 mL IVPB        2 g 200 mL/hr over 30 Minutes Intravenous Every 24 hours 08/08/22 1504     08/08/22 0500  piperacillin-tazobactam (ZOSYN) IVPB 3.375 g  Status:  Discontinued        3.375 g 12.5 mL/hr over 240 Minutes Intravenous Every 8 hours 08/07/22 2230 08/08/22 1418   08/07/22 2030  piperacillin-tazobactam (ZOSYN) IVPB 3.375 g        3.375 g 100 mL/hr over 30 Minutes Intravenous  Once 08/07/22 2029 08/07/22 2125        Assessment/Plan  Gallstone pancreatitis  POD1 S/p laparoscopic cholecystectomy w/ IOC 6/6  - IOC suspicious for choledocholithiasis  - Tbili actually trending down this AM, 1.0 from 1.7 yesterday - AST/ALT trending down, Alk phos stable - incisions C/D/I - looks well from a surgical perspective but will make sure GI aware of IOC results and make NPO in case they would plan any procedure today  FEN: NPO, IVF@ 75 cc/h VTE: SQH ID: [email or web address] - no further abx needed from surgical perspective   LOS: 3 days     Juliet Rude, [facility] 08/10/2022, 8:13 AM Please see Amion for pager number during day hours 7:00am-4:30pm

## 2022-08-10 NOTE — Progress Notes (Addendum)
Patient ID: Shelby Walker, female   DOB: September 04, 1933, 89 y.o.   MRN: [id number]    Progress Note   Subjective   Day # 3  CC; gallstone pancreatitis  IV Maxipime/Flagyl SQ heparin  Patient is status post laparoscopic cholecystectomy yesterday with IOC-IOC suspicious for CBD stone  Labs-WBC 7.3/hemoglobin 7.6/hematocrit 24.3 BNP 963 Sodium 140/potassium 3.4/BUN 56/creatinine 1.78 stable T. bili 1.0/alk phos 136/AST 584/ALT 896-trending down  Patient was able to eat solid food for breakfast, says her abdomen is sore at the incisional sites but the severe abdominal pain she had on admission has pretty much resolved.     Objective   Vital signs in last 24 hours: Temp:  [97.7 F (36.5 C)-98.6 F (37 C)] 98.1 F (36.7 C) (06/07 0800) Pulse Rate:  [66-95] 68 (06/07 0400) Resp:  [12-20] 14 (06/07 0400) BP: (106-128)/(43-71) 109/46 (06/07 0400) SpO2:  [90 %-98 %] 90 % (06/07 0400) Last BM Date : 08/08/22 General: Elderly white female in NAD, family at bedside Heart:  Regular rate and rhythm; no murmurs Lungs: Respirations even and unlabored, lungs CTA bilaterally Abdomen:  Soft, incisional ports benign there are some mild tenderness across the upper abdomen, no rebound. Normal bowel sounds. Extremities:  Without edema. Neurologic:  Alert and oriented,  grossly normal neurologically. Psych:  Cooperative. Normal mood and affect.  Intake/Output from previous day: 06/06 0701 - 06/07 0700 In: 1515.4 [I.V.:1225.2; IV Piggyback:290.2] Out: 655 [Urine:650; Blood:5] Intake/Output this shift: No intake/output data recorded.  Lab Results: Recent Labs    08/08/22 0622 08/09/22 0714 08/10/22 0622  WBC 9.3 7.8 7.3  HGB 8.1* 8.4* 7.6*  HCT 25.4* 26.1* 24.3*  PLT 159 133* 119*   BMET Recent Labs    08/08/22 0622 08/09/22 0714 08/10/22 0622  NA 136 141 140  K 5.0 3.7 3.4*  CL 102 107 109  CO2 21* 21* 23  GLUCOSE 167* 192* 295*  BUN 64* 55* 56*  CREATININE 2.00* 1.78* 1.78*   CALCIUM 7.7* 7.5* 7.3*   LFT Recent Labs    08/10/22 0622  PROT 4.7*  ALBUMIN 2.2*  AST 584*  ALT 896*  ALKPHOS 136*  BILITOT 1.0   PT/INR No results for input(s): "LABPROT", "INR" in the last 72 hours.  Studies/Results: DG Cholangiogram Operative  Result Date: 08/10/2022 CLINICAL DATA:  [id number] Surgery, elective [id number] EXAM: INTRAOPERATIVE CHOLANGIOGRAM COMPARISON:  US Abdomen and CT AP, 08/07/2022. FLUOROSCOPY: Exposure Index (as provided by the fluoroscopic device): 4.4 mGy Kerma FINDINGS: limited oblique planar images of the RIGHT upper quadrant obtained C-arm. Images demonstrating laparoscopic instrumentation, cystic duct cannulation and antegrade cholangiogram. Spillage of contrast into the duodenum, however mild extrahepatic biliary dilatation with suspected small filling defects at the distal CBD about the ampulla. See key images. IMPRESSION: Fluoroscopic imaging for intraoperative cholangiogram. Question small biliary filling defects of the distal CBD about the ampulla, despite spillage of contrast into the duodenum. Findings suspicious for choledocholithiasis. If continued clinical concern consider MRCP and/or ERCP for further evaluation. Electronically Signed   By: Roanna Banning M.D.   On: 08/10/2022 07:26   US RENAL  Result Date: 08/08/2022 CLINICAL DATA:  Acute kidney injury EXAM: RENAL / URINARY TRACT ULTRASOUND COMPLETE COMPARISON:  CT abdomen pelvis with contrast 08/07/2022 FINDINGS: Right Kidney: Renal measurements: 9.7 x 4.1 x 4.4 cm = volume: 93 mL. Mild increased echogenicity the renal cortex without significant thinning. No mass or hydronephrosis visualized. 2.7 cm simple cyst in the upper pole does not require dedicated imaging follow-up.  Left Kidney: Renal measurements: 9.6 x 4.9 x 4.8 cm = volume: 120 mL. Mild increased echogenicity the renal cortex without significant thinning. No mass or hydronephrosis visualized. Bladder: Appears normal for degree of bladder  distention. Other: None. IMPRESSION: Mild increased echogenicity of the renal cortices without significant thinning indicative of medical renal disease. Electronically Signed   By: Acquanetta Belling M.D.   On: 08/08/2022 11:31       Assessment / Plan:    #62 89 year old white female admitted with chest pain/epigastric pain secondary to gallstone pancreatitis. She underwent laparoscopic cholecystectomy yesterday and IOC which is suspicious for a distal CBD stone. Patient looks great today, sore but no severe abdominal pain, pancreatitis symptoms seem to be quickly resolving.  Tolerated solid diet today LFTs are trending down  Remains on Maxipime/Flagyl  #2 chronic kidney disease #3.  Rheumatoid arthritis #4.  Insulin-dependent diabetes mellitus #5.  Paroxysmal atrial fibrillation/not anticoagulated # 6 mild hypokalemia   Plan; Pt ate tsolid breakfast today Since LFTs are trending down we will proceed with MRI/MRCP today to confirm presence of distal CBD stone prior to committing to ERCP with stone extraction. Plans were discussed with the patient and her daughter, and they are in agreement with this plan. If MRCP is positive, ERCP with stone extraction can be scheduled for this weekend.   Principal Problem:   Acute biliary pancreatitis Active Problems:   Paroxysmal atrial fibrillation (HCC)   Uncontrolled type 2 diabetes mellitus with hyperglycemia (HCC)   Rheumatoid arthritis (HCC)   Stage 3b chronic kidney disease (CKD) (HCC)   Sepsis (HCC)     LOS: 3 days   Amy EsterwoodPA-C 08/10/2022, 9:15 AM   Attending physician's note   I have taken a history, reviewed the chart and examined the patient. I performed a substantive portion of this encounter, including complete performance of at least one of the key components, in conjunction with the APP. I agree with the APP's note, impression and recommendations.    MRCP negative for choledocholithiasis. Will hold off ERCP  Continue  to monitor LFT Advance diet as tolerated  GI will continue to follow along  The patient was provided an opportunity to ask questions and all were answered. The patient agreed with the plan and demonstrated an understanding of the instructions.   [name] , MD [phone]

## 2022-08-10 NOTE — Care Management Important Message (Signed)
Important Message  Patient Details  Name: BETTIE CAPISTRAN MRN: [id number] Date of Birth: 09-12-1933   Medicare Important Message Given:  Yes     Zuzanna Maroney Stefan Church 08/10/2022, 3:56 PM

## 2022-08-10 NOTE — Progress Notes (Signed)
PROGRESS NOTE                                                                                                                                                                                                             Patient Demographics:    Shelby Walker, is a 89 y.o. female, DOB - 06/06/1933, ZOX:[id number]  Outpatient Primary MD for the patient is Adrian Prince, MD    LOS - 3  Admit date - 08/07/2022    Chief Complaint  Patient presents with   Chest Pain       Brief Narrative (HPI from H&P)    89 y.o. 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  Her workup was consistent with gallstone pancreatitis with possible ascending cholangitis, general surgery was consulted and she was admitted to the hospital.   Subjective:   Patient in bed, appears comfortable, denies any headache, no fever, no chest pain or pressure, no shortness of breath , no abdominal pain. No focal weakness.   Assessment  & Plan :   1. Acute biliary pancreatitis -with possible ascending cholangitis and sepsis.  She has shown good improvement with IV fluids and empiric IV antibiotics, also on stress dose steroids as she is chronically on steroids, steroids being tapered.  She underwent laparoscopic cholecystectomy and tolerated the procedure well on 08/09/2022, might still have a CBD stone for which she may require ERCP, clinically better with LFTs trending down.  GI and general surgery on board.  Clinically doing better.    2. Sepsis   - Fever with rigors, mild tachycardia, SBP as low as 94, and lactate 3.5 in ED, ascending cholangitis follow  cultures, IV fluids and IV Zosyn.  General surgery & GI on board.  Sepsis pathophysiology has resolved.    3. Type II DM  - A1c was 7.3% in November 2023, Check CBGs and use low-intensity SSI for now     CBG (last 3)  Recent Labs    08/09/22 2008 08/09/22 2355 08/10/22 0420  GLUCAP 189* 328* 325*     4.  AKI on CKD 3B with  borderline hyperkalemia. - SCr is 1.45 on admission, hydrate, renal ultrasound, Foley catheter if needed, Lokelma and monitor.    5. PAF  - No longer anticoagulated d/t fall risk, Continue amiodarone, recent echocardiogram from few months ago noted with preserved EF, EKG here stable.  No chest pain at rest, cardiology to also evaluate, her heart conditions should not prohibit her from going to surgery.   6. Rheumatoid arthritis  - Managed with prednisone 5 mg daily placed with stress dose steroids.        Condition - Extremely Guarded  Family Communication  :   Both daughter and son updated in detail on 08/08/2022, they will understand that patient is extremely tenuous, she will require cholecystectomy, they understand that she is a high risk candidate for any surgical procedure.  They understand this risk and want to proceed with surgery.  Multiple family members updated at bedside on 08/09/2022.  Daughter [name] [phone]  on 08/10/2022    Code Status : DNR  Consults  : General surgery, GI, cardiology  PUD Prophylaxis : PPI   Procedures  :     Laparoscopic cholecystectomy by general surgery on 08/09/2022.    Recent echocardiogram in November 2023.  -      1. Left ventricular ejection fraction, by estimation, is 50 to 55%. The left ventricle has low normal function.   2. Right ventricular systolic function is normal. The right ventricular size is normal. There is normal pulmonary artery systolic pressure.   3. Left atrial size was moderately dilated. No left atrial/left atrial appendage thrombus was detected.   4. The mitral valve is degenerative. Mild  mitral valve regurgitation. No evidence of mitral stenosis.   5. The aortic valve is tricuspid. There is mild calcification of the aortic valve. There is mild thickening of the aortic valve. Aortic valve regurgitation is not visualized. Aortic valve sclerosis is present, with no evidence of aortic valve stenosis.   6. There is mild (Grade II) layered plaque involving the descending aorta and aortic arch.   EKG NSR, no acute ST changes.    RUQ [place] - 1. Cholelithiasis and/or gallbladder sludge, without evidence of acute cholecystitis. 2. Simple right renal cyst  CT -  Cholelithiasis with evidence of gallbladder wall thickening and likely wall edema. Right upper quadrant ultrasound may be helpful for further evaluation as this may represent acute cholecystitis. Diverticulosis without diverticulitis. Simple renal cysts bilaterally stable from prior exam. No further follow-up is recommended      Disposition Plan  :    Status is: Inpatient   DVT Prophylaxis  :    heparin injection 5,000 Units Start: 08/07/22 2215    Lab Results  Component Value Date   PLT 119 (L) 08/10/2022    Diet :  Diet Order             Diet NPO time specified Except for: Sips with Meds  Diet effective now                    Inpatient Medications  Scheduled Meds:  acetaminophen  650 mg Oral Q6H   amiodarone  100 mg Oral Daily   amLODipine  10 mg Oral Daily   atorvastatin  20 mg Oral Daily   Chlorhexidine Gluconate Cloth  6 each Topical Daily   cycloSPORINE  1 drop Both Eyes BID   diclofenac Sodium  4 g Topical TID AC & HS   docusate sodium  100 mg Oral BID  gabapentin  200 mg Oral TID   heparin  5,000 Units Subcutaneous Q8H   hydrocortisone sod succinate (SOLU-CORTEF) inj  50 mg Intravenous Q12H   insulin aspart  0-6 Units Subcutaneous Q4H   mirtazapine  7.5 mg Oral QHS   pantoprazole  40 mg Oral Daily   sodium chloride flush  3 mL Intravenous Q12H   Continuous Infusions:  ceFEPime (MAXIPIME)  IV Stopped (08/08/22 1627)   lactated ringers 75 mL/hr (08/09/22 1855)   metronidazole 500 mg (08/09/22 2303)   PRN Meds:.acetaminophen **OR** acetaminophen, fentaNYL (SUBLIMAZE) injection, methocarbamol, ondansetron **OR** ondansetron (ZOFRAN) IV, oxyCODONE, simethicone  Antibiotics  :    Anti-infectives (From admission, onward)    Start     Dose/Rate Route Frequency Ordered Stop   08/09/22 1505  ceFAZolin (ANCEF) 2-4 GM/100ML-% IVPB       Note to Pharmacy: [facility], [place]: cabinet override      08/09/22 1505 08/10/22 0314   08/08/22 2200  metroNIDAZOLE (FLAGYL) IVPB 500 mg        500 mg 100 mL/hr over 60 Minutes Intravenous Every 12 hours 08/08/22 1504     08/08/22 2000  piperacillin-tazobactam (ZOSYN) IVPB 2.25 g  Status:  Discontinued        2.25 g 100 mL/hr over 30 Minutes Intravenous Every 6 hours 08/08/22 1418 08/08/22 1504   08/08/22 1600  ceFEPIme (MAXIPIME) 2 g in sodium chloride 0.9 % 100 mL IVPB        2 g 200 mL/hr over 30 Minutes Intravenous Every 24 hours 08/08/22 1504     08/08/22 0500  piperacillin-tazobactam (ZOSYN) IVPB 3.375 g  Status:  Discontinued        3.375 g 12.5 mL/hr over 240 Minutes Intravenous Every 8 hours 08/07/22 2230 08/08/22 1418   08/07/22 2030  piperacillin-tazobactam (ZOSYN) IVPB 3.375 g        3.375 g 100 mL/hr over 30 Minutes Intravenous  Once 08/07/22 2029 08/07/22 2125         Objective:   Vitals:   08/09/22 1829 08/09/22 2000 08/10/22 0000 08/10/22 0400  BP: 115/71 (!) 116/50 (!) 106/43 (!) 109/46  Pulse: 83 89 80 68  Resp: 17 18 14 14   Temp: 98 F (36.7 C) 98.2 F (36.8 C) 98 F (36.7 C) 98.1 F (36.7 C)  TempSrc: Oral Oral Oral Oral  SpO2: 95% 95% 92% 90%  Weight:      Height:        Wt Readings from Last 3 Encounters:  08/08/22 50.8 kg  07/23/22 51.2 kg  05/28/22 50.3 kg     Intake/Output Summary (Last 24 hours) at 08/10/2022 0848 Last data filed at 08/09/2022 1800 Gross per 24 hour  Intake 1515.41 ml  Output 655  ml  Net 860.41 ml     Physical Exam  Awake Alert, No new F.N deficits, Normal affect [place].AT,PERRAL Supple Neck, No JVD,   Symmetrical Chest wall movement, Good air movement bilaterally, CTAB RRR,No Gallops,Rubs or new Murmurs,  +ve B.Sounds, Abd Soft, No RUQ tenderness,   No Cyanosis, Clubbing or edema       Data Review:    Recent Labs  Lab 08/07/22 1710 08/08/22 0622 08/09/22 0714 08/10/22 0622  WBC 9.6 9.3 7.8 7.3  HGB 9.2* 8.1* 8.4* 7.6*  HCT 30.0* 25.4* 26.1* 24.3*  PLT 194 159 133* 119*  MCV 92.3 90.7 90.9 91.7  MCH 28.3 28.9 29.3 28.7  MCHC 30.7 31.9 32.2 31.3  RDW 16.1* 16.5* 16.2* 16.5*  LYMPHSABS  0.6*  --  0.2* 0.2*  MONOABS 0.2  --  0.0* 0.1  EOSABS 0.0  --  0.0 0.0  BASOSABS 0.0  --  0.2* 0.0    Recent Labs  Lab 08/07/22 1710 08/07/22 1915 08/07/22 2129 08/08/22 0620 08/08/22 0622 08/09/22 0714 08/10/22 0622  NA 140  --   --   --  136 141 140  K 4.2  --   --   --  5.0 3.7 3.4*  CL 107  --   --   --  102 107 109  CO2 21*  --   --   --  21* 21* 23  ANIONGAP 12  --   --   --  13 13 8   GLUCOSE 228*  --   --   --  167* 192* 295*  BUN 63*  --   --   --  64* 55* 56*  CREATININE 1.45*  --   --   --  2.00* 1.78* 1.78*  AST 310*  --   --   --  4,600* 1,286* 584*  ALT 149*  --   --   --  2,504* 1,424* 896*  ALKPHOS 59  --   --   --  155* 136* 136*  BILITOT 1.2  --   --   --  2.9* 1.7* 1.0  ALBUMIN 3.1*  --   --   --  2.6* 2.2* 2.2*  CRP  --   --   --   --  7.2* 10.8* 8.5*  PROCALCITON  --   --   --   --  51.50 34.93  --   LATICACIDVEN  --  3.5* 3.4*  --  1.6  --   --   HGBA1C  --   --   --  9.2*  --   --   --   BNP  --   --   --   --  365.7* 866.7* 963.7*  MG  --   --   --   --  2.0 2.1 2.3  CALCIUM 8.5*  --   --   --  7.7* 7.5* 7.3*      Recent Labs  Lab 08/07/22 1710 08/07/22 1915 08/07/22 2129 08/08/22 0620 08/08/22 0622 08/09/22 0714 08/10/22 0622  CRP  --   --   --   --  7.2* 10.8* 8.5*  PROCALCITON  --   --   --   --  51.50 34.93   --   LATICACIDVEN  --  3.5* 3.4*  --  1.6  --   --   HGBA1C  --   --   --  9.2*  --   --   --   BNP  --   --   --   --  365.7* 866.7* 963.7*  MG  --   --   --   --  2.0 2.1 2.3  CALCIUM 8.5*  --   --   --  7.7* 7.5* 7.3*    Radiology Reports DG Cholangiogram Operative  Result Date: 08/10/2022 CLINICAL DATA:  [id number] Surgery, elective [id number] EXAM: INTRAOPERATIVE CHOLANGIOGRAM COMPARISON:  US Abdomen and CT AP, 08/07/2022. FLUOROSCOPY: Exposure Index (as provided by the fluoroscopic device): 4.4 mGy Kerma FINDINGS: limited oblique planar images of the RIGHT upper quadrant obtained C-arm. Images demonstrating laparoscopic instrumentation, cystic duct cannulation and antegrade cholangiogram. Spillage of contrast into the duodenum, however mild extrahepatic biliary dilatation with suspected small filling defects at the distal CBD  about the ampulla. See key images. IMPRESSION: Fluoroscopic imaging for intraoperative cholangiogram. Question small biliary filling defects of the distal CBD about the ampulla, despite spillage of contrast into the duodenum. Findings suspicious for choledocholithiasis. If continued clinical concern consider MRCP and/or ERCP for further evaluation. Electronically Signed   By: Roanna Banning M.D.   On: 08/10/2022 07:26   US RENAL  Result Date: 08/08/2022 CLINICAL DATA:  Acute kidney injury EXAM: RENAL / URINARY TRACT ULTRASOUND COMPLETE COMPARISON:  CT abdomen pelvis with contrast 08/07/2022 FINDINGS: Right Kidney: Renal measurements: 9.7 x 4.1 x 4.4 cm = volume: 93 mL. Mild increased echogenicity the renal cortex without significant thinning. No mass or hydronephrosis visualized. 2.7 cm simple cyst in the upper pole does not require dedicated imaging follow-up. Left Kidney: Renal measurements: 9.6 x 4.9 x 4.8 cm = volume: 120 mL. Mild increased echogenicity the renal cortex without significant thinning. No mass or hydronephrosis visualized. Bladder: Appears normal for degree of bladder  distention. Other: None. IMPRESSION: Mild increased echogenicity of the renal cortices without significant thinning indicative of medical renal disease. Electronically Signed   By: Acquanetta Belling M.D.   On: 08/08/2022 11:31   DG Chest Port 1 View  Result Date: 08/08/2022 CLINICAL DATA:  Shortness of breath EXAM: PORTABLE CHEST 1 VIEW COMPARISON:  Yesterday FINDINGS: Hazy density at the left base, atelectasis or scarring. Streak normal heart size and mediastinal contours. There is no edema, consolidation, effusion, or pneumothorax. Which is IMPRESSION: Mild scarring/atelectasis at the left lung base. No change from prior. Electronically Signed   By: Tiburcio Pea M.D.   On: 08/08/2022 06:42   US Abdomen Limited RUQ (LIVER/GB)  Result Date: 08/07/2022 CLINICAL DATA:  Right upper quadrant pain. EXAM: ULTRASOUND ABDOMEN LIMITED RIGHT UPPER QUADRANT COMPARISON:  Aug 02, 2018 FINDINGS: Gallbladder: Small echogenic gallstones and/or gallbladder sludge is suspected within the region of the gallbladder fundus. There is no evidence of gallbladder wall thickening (2.5 mm) no sonographic Murphy sign noted by sonographer. Common bile duct: Diameter: 7.1 mm Liver: No focal lesion identified. Within normal limits in parenchymal echogenicity. Portal vein is patent on color Doppler imaging with normal direction of blood flow towards the liver. Other: A 2.6 cm x 1.8 cm x 2.1 cm simple cyst is seen within the upper pole of the right kidney. IMPRESSION: 1. Cholelithiasis and/or gallbladder sludge, without evidence of acute cholecystitis. 2. Simple right renal cyst. Electronically Signed   By: Aram Candela M.D.   On: 08/07/2022 21:17   CT ABDOMEN PELVIS W CONTRAST  Result Date: 08/07/2022 CLINICAL DATA:  Epigastric pain EXAM: CT ABDOMEN AND PELVIS WITH CONTRAST TECHNIQUE: Multidetector CT imaging of the abdomen and pelvis was performed using the standard protocol following bolus administration of intravenous contrast.  RADIATION DOSE REDUCTION: This exam was performed according to the departmental dose-optimization program which includes automated exposure control, adjustment of the mA and/or kV according to patient size and/or use of iterative reconstruction technique. CONTRAST:  50mL OMNIPAQUE IOHEXOL 350 MG/ML SOLN COMPARISON:  01/24/2022 FINDINGS: Lower chest: Lung bases show mild left basilar atelectasis. No focal infiltrate or effusion is seen. Hepatobiliary: Liver is well visualized and within normal limits. Gallbladder is well distended with some dependent density consistent with small gallstones. Wall thickening and likely wall edema is noted. No obstructive changes are seen. Pancreas: Unremarkable. No pancreatic ductal dilatation or surrounding inflammatory changes. Spleen: Normal in size without focal abnormality. Adrenals/Urinary Tract: Adrenal glands are within normal limits. Kidneys are well visualized bilaterally  within normal enhancement pattern. No renal calculi are noted. Simple appearing right renal cyst is noted measuring 2.8 cm. This is similar to that seen on the prior exam. No further follow-up is recommended. Smaller left renal cyst is noted also stable from the prior study. No obstructive changes are seen. The bladder is well distended. Stomach/Bowel: Scattered diverticular change of the colon is noted without evidence of diverticulitis. The appendix is within normal limits. No obstructive changes of the colon are seen. Small bowel and stomach are within normal limits. Vascular/Lymphatic: Aortic atherosclerosis. No enlarged abdominal or pelvic lymph nodes. Reproductive: Uterus and bilateral adnexa are unremarkable. Other: No abdominal wall hernia or abnormality. No abdominopelvic ascites. Musculoskeletal: Mild degenerative changes of lumbar spine are noted with anterolisthesis of L4 on L5. IMPRESSION: Cholelithiasis with evidence of gallbladder wall thickening and likely wall edema. Right upper quadrant  ultrasound may be helpful for further evaluation as this may represent acute cholecystitis. Diverticulosis without diverticulitis. Simple renal cysts bilaterally stable from prior exam. No further follow-up is recommended. Electronically Signed   By: Alcide Clever M.D.   On: 08/07/2022 20:33   DG Chest Portable 1 View  Result Date: 08/07/2022 CLINICAL DATA:  Epigastric pain EXAM: PORTABLE CHEST 1 VIEW COMPARISON:  Chest x-ray 01/29/2022 FINDINGS: The heart size and mediastinal contours are within normal limits. Both lungs are clear. There is blunting of the left costophrenic angle. There is no pneumothorax. The visualized skeletal structures are unremarkable. IMPRESSION: Blunting of the left costophrenic angle may be due to pleural thickening or small pleural effusion. Electronically Signed   By: Darliss Cheney M.D.   On: 08/07/2022 18:01      Signature  -   Susa Raring M.D on 08/10/2022 at 8:48 AM   -  To page go to [email or web address]

## 2022-08-10 NOTE — Discharge Instructions (Signed)
Follow with Primary MD Adrian Prince, MD in 7 days, kindly review and follow instructions given by general surgery as well.  Get CBC, CMP, magnesium,-  checked next visit with your primary MD   Activity: As tolerated with Full fall precautions use walker/cane & assistance as needed  Disposition Home   Diet: Heart Healthy Low Carb, check CBGs q. ACH S.  Special Instructions: If you have smoked or chewed Tobacco  in the last 2 yrs please stop smoking, stop any regular Alcohol  and or any Recreational drug use.  On your next visit with your primary care physician please Get Medicines reviewed and adjusted.  Please request your Prim.MD to go over all Hospital Tests and Procedure/Radiological results at the follow up, please get all Hospital records sent to your Prim MD by signing hospital release before you go home.  If you experience worsening of your admission symptoms, develop shortness of breath, life threatening emergency, suicidal or homicidal thoughts you must seek medical attention immediately by calling 911 or calling your MD immediately  if symptoms less severe.  You Must read complete instructions/literature along with all the possible adverse reactions/side effects for all the Medicines you take and that have been prescribed to you. Take any new Medicines after you have completely understood and accpet all the possible adverse reactions/side effects.       [facility]  Please arrive at least 30 min before your appointment to complete your check in paperwork.  If you are unable to arrive 30 min prior to your appointment time we may have to cancel or reschedule you. LAPAROSCOPIC SURGERY: POST OP INSTRUCTIONS Always review your discharge instruction sheet given to you by the facility where your surgery was performed. IF YOU HAVE DISABILITY OR FAMILY LEAVE FORMS, YOU MUST BRING THEM TO THE OFFICE FOR PROCESSING.   DO NOT GIVE THEM TO YOUR DOCTOR.  PAIN  CONTROL  First take acetaminophen (Tylenol) AND/or ibuprofen (Advil) to control your pain after surgery.  Follow directions on package.  Taking acetaminophen (Tylenol) and/or ibuprofen (Advil) regularly after surgery will help to control your pain and lower the amount of prescription pain medication you may need.  You should not take more than 4,000 mg (4 grams) of acetaminophen (Tylenol) in 24 hours.  You should not take ibuprofen (Advil), aleve, motrin, naprosyn or other NSAIDS if you have a history of stomach ulcers or chronic kidney disease.  A prescription for pain medication may be given to you upon discharge.  Take your pain medication as prescribed, if you still have uncontrolled pain after taking acetaminophen (Tylenol) or ibuprofen (Advil). Use ice packs to help control pain. If you need a refill on your pain medication, please contact your pharmacy.  They will contact our office to request authorization. Prescriptions will not be filled after 5pm or on week-ends.  HOME MEDICATIONS Take your usually prescribed medications unless otherwise directed.  DIET You should follow a light diet the first few days after arrival home.  Be sure to include lots of fluids daily. Avoid fatty, fried foods.   CONSTIPATION It is common to experience some constipation after surgery and if you are taking pain medication.  Increasing fluid intake and taking a stool softener (such as Colace) will usually help or prevent this problem from occurring.  A mild laxative (Milk of Magnesia or Miralax) should be taken according to package instructions if there are no bowel movements after 48 hours.  WOUND/INCISION CARE Most patients will  experience some swelling and bruising in the area of the incisions.  Ice packs will help.  Swelling and bruising can take several days to resolve.  Unless discharge instructions indicate otherwise, follow guidelines below  STERI-STRIPS - you may remove your outer bandages 48 hours  after surgery, and you may shower at that time.  You have steri-strips (small skin tapes) in place directly over the incision.  These strips should be left on the skin for 7-10 days.   DERMABOND/SKIN GLUE - you may shower in 24 hours.  The glue will flake off over the next 2-3 weeks. Any sutures or staples will be removed at the office during your follow-up visit.  ACTIVITIES You may resume regular (light) daily activities beginning the next day--such as daily self-care, walking, climbing stairs--gradually increasing activities as tolerated.  You may have sexual intercourse when it is comfortable.  Refrain from any heavy lifting or straining until approved by your doctor. You may drive when you are no longer taking prescription pain medication, you can comfortably wear a seatbelt, and you can safely maneuver your car and apply brakes.  FOLLOW-UP You should see your doctor in the office for a follow-up appointment approximately 2-3 weeks after your surgery.  You should have been given your post-op/follow-up appointment when your surgery was scheduled.  If you did not receive a post-op/follow-up appointment, make sure that you call for this appointment within a day or two after you arrive home to insure a convenient appointment time.   WHEN TO CALL YOUR DOCTOR: Fever over 101.0 Inability to urinate Continued bleeding from incision. Increased pain, redness, or drainage from the incision. Increasing abdominal pain  The clinic staff is available to answer your questions during regular business hours.  Please don't hesitate to call and ask to speak to one of the nurses for clinical concerns.  If you have a medical emergency, go to the nearest emergency room or call 911.  A surgeon from [facility] is always on call at the hospital. [address], [place], [place]  [address] ? [address] [address], [place], [place]   [address] [phone] ? [phone] ? FAX [phone]

## 2022-08-10 NOTE — Progress Notes (Addendum)
Rounding Note    Patient Name: Shelby Walker Date of Encounter: 08/10/2022  [facility] Cardiologist: Kristeen Miss, MD   Subjective   Post op day 1 from chole. Feeling good without any significant complaints. Has some mild abdominal pain.   Inpatient Medications    Scheduled Meds:  acetaminophen  650 mg Oral Q6H   amiodarone  100 mg Oral Daily   amLODipine  10 mg Oral Daily   atorvastatin  20 mg Oral Daily   Chlorhexidine Gluconate Cloth  6 each Topical Daily   cycloSPORINE  1 drop Both Eyes BID   diclofenac Sodium  4 g Topical TID AC & HS   docusate sodium  100 mg Oral BID   gabapentin  200 mg Oral TID   heparin  5,000 Units Subcutaneous Q8H   hydrocortisone sod succinate (SOLU-CORTEF) inj  50 mg Intravenous Q12H   insulin aspart  0-6 Units Subcutaneous Q4H   mirtazapine  7.5 mg Oral QHS   pantoprazole  40 mg Oral Daily   sodium chloride flush  3 mL Intravenous Q12H   Continuous Infusions:  ceFEPime (MAXIPIME) IV Stopped (08/08/22 1627)   lactated ringers 75 mL/hr (08/09/22 1855)   metronidazole 500 mg (08/10/22 0923)   PRN Meds: acetaminophen **OR** acetaminophen, fentaNYL (SUBLIMAZE) injection, methocarbamol, ondansetron **OR** ondansetron (ZOFRAN) IV, oxyCODONE, simethicone   Vital Signs    Vitals:   08/10/22 0000 08/10/22 0400 08/10/22 0800 08/10/22 0925  BP: (!) 106/43 (!) 109/46  (!) 118/54  Pulse: 80 68    Resp: 14 14    Temp: 98 F (36.7 C) 98.1 F (36.7 C) 98.1 F (36.7 C)   TempSrc: Oral Oral Oral   SpO2: 92% 90%    Weight:      Height:        Intake/Output Summary (Last 24 hours) at 08/10/2022 1017 Last data filed at 08/09/2022 1800 Gross per 24 hour  Intake 1515.41 ml  Output 455 ml  Net 1060.41 ml       08/08/2022   12:49 AM 07/23/2022   11:04 AM 05/28/2022    3:12 PM  Last 3 Weights  Weight (lbs) 111 lb 15.9 oz 112 lb 12.8 oz 111 lb  Weight (kg) 50.8 kg 51.166 kg 50.349 kg      Telemetry    Normal sinus rhythm heart rates  in the 60s.- Personally Reviewed  ECG    No new tracings- Personally Reviewed  Physical Exam   GEN: Scleral icterus Neck: No JVD Cardiac: RRR, no murmurs, rubs, or gallops.  Respiratory: Clear to auscultation bilaterally. GI: Soft, nontender, non-distended  MS: No edema; No deformity. Neuro:  Nonfocal  Psych: Normal affect   Labs    High Sensitivity Troponin:   Recent Labs  Lab 08/07/22 1710 08/07/22 1848  TROPONINIHS 7 11      Chemistry Recent Labs  Lab 08/08/22 0622 08/09/22 0714 08/10/22 0622  NA 136 141 140  K 5.0 3.7 3.4*  CL 102 107 109  CO2 21* 21* 23  GLUCOSE 167* 192* 295*  BUN 64* 55* 56*  CREATININE 2.00* 1.78* 1.78*  CALCIUM 7.7* 7.5* 7.3*  MG 2.0 2.1 2.3  PROT 5.3* 4.7* 4.7*  ALBUMIN 2.6* 2.2* 2.2*  AST 4,600* 1,286* 584*  ALT 2,504* 1,424* 896*  ALKPHOS 155* 136* 136*  BILITOT 2.9* 1.7* 1.0  GFRNONAA 24* 27* 27*  ANIONGAP 13 13 8      Lipids  Recent Labs  Lab 08/08/22 1249  CHOL  111  TRIG 153*  HDL 29*  LDLCALC 51  CHOLHDL 3.8     Hematology Recent Labs  Lab 08/08/22 0622 08/09/22 0714 08/10/22 0622  WBC 9.3 7.8 7.3  RBC 2.80* 2.87* 2.65*  HGB 8.1* 8.4* 7.6*  HCT 25.4* 26.1* 24.3*  MCV 90.7 90.9 91.7  MCH 28.9 29.3 28.7  MCHC 31.9 32.2 31.3  RDW 16.5* 16.2* 16.5*  PLT 159 133* 119*    Thyroid No results for input(s): "TSH", "FREET4" in the last 168 hours.  BNP Recent Labs  Lab 08/08/22 0622 08/09/22 0714 08/10/22 0622  BNP 365.7* 866.7* 963.7*     DDimer No results for input(s): "DDIMER" in the last 168 hours.   Radiology    DG Cholangiogram Operative  Result Date: 08/10/2022 CLINICAL DATA:  [id number] Surgery, elective [id number] EXAM: INTRAOPERATIVE CHOLANGIOGRAM COMPARISON:  US Abdomen and CT AP, 08/07/2022. FLUOROSCOPY: Exposure Index (as provided by the fluoroscopic device): 4.4 mGy Kerma FINDINGS: limited oblique planar images of the RIGHT upper quadrant obtained C-arm. Images demonstrating laparoscopic  instrumentation, cystic duct cannulation and antegrade cholangiogram. Spillage of contrast into the duodenum, however mild extrahepatic biliary dilatation with suspected small filling defects at the distal CBD about the ampulla. See key images. IMPRESSION: Fluoroscopic imaging for intraoperative cholangiogram. Question small biliary filling defects of the distal CBD about the ampulla, despite spillage of contrast into the duodenum. Findings suspicious for choledocholithiasis. If continued clinical concern consider MRCP and/or ERCP for further evaluation. Electronically Signed   By: Roanna Banning M.D.   On: 08/10/2022 07:26   US RENAL  Result Date: 08/08/2022 CLINICAL DATA:  Acute kidney injury EXAM: RENAL / URINARY TRACT ULTRASOUND COMPLETE COMPARISON:  CT abdomen pelvis with contrast 08/07/2022 FINDINGS: Right Kidney: Renal measurements: 9.7 x 4.1 x 4.4 cm = volume: 93 mL. Mild increased echogenicity the renal cortex without significant thinning. No mass or hydronephrosis visualized. 2.7 cm simple cyst in the upper pole does not require dedicated imaging follow-up. Left Kidney: Renal measurements: 9.6 x 4.9 x 4.8 cm = volume: 120 mL. Mild increased echogenicity the renal cortex without significant thinning. No mass or hydronephrosis visualized. Bladder: Appears normal for degree of bladder distention. Other: None. IMPRESSION: Mild increased echogenicity of the renal cortices without significant thinning indicative of medical renal disease. Electronically Signed   By: Acquanetta Belling M.D.   On: 08/08/2022 11:31    Cardiac Studies   Echo 01/25/22: 1. Left ventricular ejection fraction, by estimation, is 50 to 55%. The  left ventricle has low normal function. The left ventricle has no regional  wall motion abnormalities. Left ventricular diastolic parameters are  consistent with Grade II diastolic  dysfunction (pseudonormalization).   2. Right ventricular systolic function is normal. The right ventricular   size is normal. There is normal pulmonary artery systolic pressure. The  estimated right ventricular systolic pressure is 34.7 mmHg.   3. Left atrial size was moderately dilated.   4. The mitral valve is degenerative. Mild mitral valve regurgitation. The  mean mitral valve gradient is 4.0 mmHg with average heart rate of 77 bpm.   5. The aortic valve is tricuspid. There is mild calcification of the  aortic valve. There is mild thickening of the aortic valve. Aortic valve  regurgitation is not visualized. Aortic valve sclerosis is present, with  no evidence of aortic valve stenosis.   6. The inferior vena cava is dilated in size with <50% respiratory  variability, suggesting right atrial pressure of 15 mmHg.  Patient Profile     89 y.o. female PAD, HLD, DM, PAF, PPM for 25 sec pause explanted due to infection chest pain.  Currently being admitted for cholecystectomy.  Assessment & Plan   Paroxysmal atrial fibrillation Currently in normal sinus rhythm.  Not anticoagulated due to high fall risk.  If converts to atrial fibrillation with concurrent illness can consider IV amiodarone with heparin drip. 01/2022 Echocardiogram shows LVEF of 50-55% with grade 2 diastolic dysfunction. Continue p.o. amiodarone 100mg   Hx of 25-sec pause PPM explanted due to infection Monitor on telemetry during surgery Avoid BB, no pauses on telemetry  CAD Coronary CTA shows left main disease that has been stable.  Denies any anginal symptoms.  Previously not felt to be a candidate for cardiac catheterization. Continue atorvastatin 20 mg daily  Hypertension Currently on amlodipine 10 mg daily.  BP looks great today    Post op day 1 from chole. Procedure went well without any complications and patient feeling good. BP has improved and within target goal. No evidence of any arrhythmias on tele.    For questions or updates, please contact [facility] Please consult [email or web address] for contact  info under        Signed, Abagail Kitchens, PA-C  08/10/2022, 10:17 AM     Attending Note:   The patient was seen and examined.  Agree with assessment and plan as noted above.  Changes made to the above note as needed.  Patient seen and independently examined with Yvonna Alanis, PA .   We discussed all aspects of the encounter. I agree with the assessment and plan as stated above.   PAF:   has maintained NSR .   She has not been on chronic anticoagulation due to high fall risk   2.  CAD no angina   She has done well with lap chole She is at low risk if she needs to have ERCP  [facility] will sign off.   Medication Recommendations:   Other recommendations (labs, testing, etc):   Follow up as an outpatient:  Marisol Glazer at her regular appt    I have spent a total of 40 minutes with patient reviewing hospital  notes , telemetry, EKGs, labs and examining patient as well as establishing an assessment and plan that was discussed with the patient.  > 50% of time was spent in direct patient care.    Vesta Mixer, Montez Hageman., MD, [facility] 08/10/2022, 4:29 PM [address],  [address] Office [phone] Pager [phone]

## 2022-08-11 ENCOUNTER — Encounter (HOSPITAL_COMMUNITY)
Admission: EM | Disposition: A | Discharge status: Home Health | Payer: Self-pay | Source: Home / Self Care | Attending: Internal Medicine

## 2022-08-11 DIAGNOSIS — N1832 Chronic kidney disease, stage 3b: Secondary | ICD-10-CM | POA: Diagnosis not present

## 2022-08-11 DIAGNOSIS — I480 Paroxysmal atrial fibrillation: Secondary | ICD-10-CM | POA: Diagnosis not present

## 2022-08-11 DIAGNOSIS — K851 Biliary acute pancreatitis without necrosis or infection: Secondary | ICD-10-CM | POA: Diagnosis not present

## 2022-08-11 DIAGNOSIS — A419 Sepsis, unspecified organism: Secondary | ICD-10-CM | POA: Diagnosis not present

## 2022-08-11 LAB — COMPREHENSIVE METABOLIC PANEL
ALT: 572 U/L — ABNORMAL HIGH (ref 0–44)
AST: 259 U/L — ABNORMAL HIGH (ref 15–41)
Albumin: 2.2 g/dL — ABNORMAL LOW (ref 3.5–5.0)
Alkaline Phosphatase: 121 U/L (ref 38–126)
Anion gap: 10 (ref 5–15)
BUN: 50 mg/dL — ABNORMAL HIGH (ref 8–23)
CO2: 23 mmol/L (ref 22–32)
Calcium: 7.2 mg/dL — ABNORMAL LOW (ref 8.9–10.3)
Chloride: 109 mmol/L (ref 98–111)
Creatinine, Ser: 1.58 mg/dL — ABNORMAL HIGH (ref 0.44–1.00)
GFR, Estimated: 31 mL/min — ABNORMAL LOW (ref >60)
Glucose, Bld: 206 mg/dL — ABNORMAL HIGH (ref 70–99)
Potassium: 4.1 mmol/L (ref 3.5–5.1)
Sodium: 142 mmol/L (ref 135–145)
Total Bilirubin: 0.7 mg/dL (ref 0.3–1.2)
Total Protein: 4.6 g/dL — ABNORMAL LOW (ref 6.5–8.1)

## 2022-08-11 LAB — CBC WITH DIFFERENTIAL/PLATELET
Abs Immature Granulocytes: 0.12 10*3/uL — ABNORMAL HIGH (ref 0.00–0.07)
Basophils Absolute: 0 10*3/uL (ref 0.0–0.1)
Basophils Relative: 0 %
Eosinophils Absolute: 0 10*3/uL (ref 0.0–0.5)
Eosinophils Relative: 0 %
HCT: 24.5 % — ABNORMAL LOW (ref 36.0–46.0)
Hemoglobin: 7.9 g/dL — ABNORMAL LOW (ref 12.0–15.0)
Immature Granulocytes: 2 %
Lymphocytes Relative: 6 %
Lymphs Abs: 0.4 10*3/uL — ABNORMAL LOW (ref 0.7–4.0)
MCH: 29.8 pg (ref 26.0–34.0)
MCHC: 32.2 g/dL (ref 30.0–36.0)
MCV: 92.5 fL (ref 80.0–100.0)
Monocytes Absolute: 0.2 10*3/uL (ref 0.1–1.0)
Monocytes Relative: 3 %
Neutro Abs: 5.3 10*3/uL (ref 1.7–7.7)
Neutrophils Relative %: 89 %
Platelets: 104 10*3/uL — ABNORMAL LOW (ref 150–400)
RBC: 2.65 MIL/uL — ABNORMAL LOW (ref 3.87–5.11)
RDW: 16.6 % — ABNORMAL HIGH (ref 11.5–15.5)
WBC: 5.9 10*3/uL (ref 4.0–10.5)
nRBC: 0 % (ref 0.0–0.2)

## 2022-08-11 LAB — GLUCOSE, CAPILLARY
Glucose-Capillary: 197 mg/dL — ABNORMAL HIGH (ref 70–99)
Glucose-Capillary: 189 mg/dL — ABNORMAL HIGH (ref 70–99)
Glucose-Capillary: 301 mg/dL — ABNORMAL HIGH (ref 70–99)
Glucose-Capillary: 239 mg/dL — ABNORMAL HIGH (ref 70–99)
Glucose-Capillary: 227 mg/dL — ABNORMAL HIGH (ref 70–99)

## 2022-08-11 LAB — PROCALCITONIN: Procalcitonin: 22.41 ng/mL

## 2022-08-11 LAB — C-REACTIVE PROTEIN: CRP: 8.1 mg/dL — ABNORMAL HIGH (ref <1.0)

## 2022-08-11 LAB — BRAIN NATRIURETIC PEPTIDE: B Natriuretic Peptide: 849 pg/mL — ABNORMAL HIGH (ref 0.0–100.0)

## 2022-08-11 LAB — MAGNESIUM: Magnesium: 2.3 mg/dL (ref 1.7–2.4)

## 2022-08-11 SURGERY — ERCP, WITH INTERVENTION IF INDICATED
Anesthesia: General

## 2022-08-11 MED ORDER — PREDNISONE 5 MG PO TABS
10.0000 mg | ORAL_TABLET | Freq: Every day | ORAL | Status: DC
  Administered 2022-08-12: 10 mg via ORAL
  Filled 2022-08-11: qty 2

## 2022-08-11 NOTE — Progress Notes (Signed)
Physical Therapy Treatment Patient Details Name: ANNAKATE Walker MRN: [id number] DOB: 04-30-1933 Today's Date: 08/11/2022   History of Present Illness 89 y/o F admitted to [facility] on 6/4 for epigastric pain, nausea, vomiting, and shaking chills. CT of abdomen shows cholelithiasis with evidence of gallbladder wall thickening, RUQ ultrasound without evidence of acute cholecystitis but gallbladder sludge and cholelithiasis. Plans for lap chole 6/6. PMHx: RA, DM, CKD 3B, mitral regurgitation, PAF, pacemaker implantation removed due to MSSA bacteremia, recurrent c diff    PT Comments    Pt tolerated today's session well, continuing to demonstrate progression with all mobility. Pt perform bed mobility with supervision today, transfers and ambulation with minG to supervision and RW. Pt instructed on proper hand placement and pushing up from the bed to stand, good carry over noted with repetition. Pt continues to require cueing for forward gaze and upright posture with ambulation, no LOB noted during today's session. Pt performed stair trial to prepare for discharge to her home, minG provided for balance and safety, heavy reliance on the rails but pt managing well. Discharge recommendations remain appropriate, acute PT will continue to follow during admission.     Recommendations for follow up therapy are one component of a multi-disciplinary discharge planning process, led by the attending physician.  Recommendations may be updated based on patient status, additional functional criteria and insurance authorization.  Follow Up Recommendations       Assistance Recommended at Discharge Frequent or constant Supervision/Assistance  Patient can return home with the following A little help with walking and/or transfers;Help with stairs or ramp for entrance;Assist for transportation;Direct supervision/assist for medications management;Assistance with cooking/housework;A little help with bathing/dressing/bathroom    Equipment Recommendations  None recommended by PT    Recommendations for Other Services       Precautions / Restrictions Precautions Precautions: Fall;Other (comment) Precaution Comments: watch O2, urinary and bowel incontinence Restrictions Weight Bearing Restrictions: No     Mobility  Bed Mobility Overal bed mobility: Needs Assistance Bed Mobility: Supine to Sit, Sit to Supine     Supine to sit: HOB elevated, Supervision Sit to supine: HOB elevated, Supervision   General bed mobility comments: supervision for safety    Transfers Overall transfer level: Needs assistance Equipment used: Rolling walker (2 wheels) Transfers: Sit to/from Stand Sit to Stand: Min guard, Supervision           General transfer comment: minG progressing to supervision. Educated on pushing up from bed instead of utilizing RW, able to perform 5 reps properly    Ambulation/Gait Ambulation/Gait assistance: Min guard, Supervision Gait Distance (Feet): 250 Feet Assistive device: Rolling walker (2 wheels) Gait Pattern/deviations: Decreased stride length, Trunk flexed, Step-through pattern, Drifts right/left Gait velocity: decreased     General Gait Details: min guard progressing to supervision, cues for upright posture and forward gaze. Mild path deviation and brief cues for obstacle negotiation. Limited LLE swing phase due to R TMA   Stairs Stairs: Yes Stairs assistance: Min guard Stair Management: One rail Left, Two rails, Step to pattern, Forwards, Sideways Number of Stairs: 2 General stair comments: ascending with 2 rails but descending with one rail sideways, reporting this is how she performs at home   Wheelchair Mobility    Modified Rankin (Stroke Patients Only)       Balance Overall balance assessment: Needs assistance Sitting-balance support: No upper extremity supported, Feet supported Sitting balance-Leahy Scale: Good     Standing balance support: Bilateral upper  extremity supported, During functional activity,  Reliant on assistive device for balance Standing balance-Leahy Scale: Poor Standing balance comment: Pt reliant on RW for balance in standing and ambulation                            Cognition Arousal/Alertness: Awake/alert Behavior During Therapy: WFL for tasks assessed/performed Overall Cognitive Status: Within Functional Limits for tasks assessed                                          Exercises Other Exercises Other Exercises: sit<>stand x5 reps    General Comments General comments (skin integrity, edema, etc.): VSS on room air      Pertinent Vitals/Pain Pain Assessment Pain Assessment: No/denies pain    Home Living                          Prior Function            PT Goals (current goals can now be found in the care plan section) Acute Rehab PT Goals Patient Stated Goal: get better and go home PT Goal Formulation: With patient/family Time For Goal Achievement: 08/22/22 Potential to Achieve Goals: Good Progress towards PT goals: Progressing toward goals    Frequency    Min 3X/week      PT Plan Current plan remains appropriate    Co-evaluation              AM-PAC PT "6 Clicks" Mobility   Outcome Measure  Help needed turning from your back to your side while in a flat bed without using bedrails?: A Little Help needed moving from lying on your back to sitting on the side of a flat bed without using bedrails?: A Little Help needed moving to and from a bed to a chair (including a wheelchair)?: A Little Help needed standing up from a chair using your arms (e.g., wheelchair or bedside chair)?: A Little Help needed to walk in hospital room?: A Little Help needed climbing 3-5 steps with a railing? : A Little 6 Click Score: 18    End of Session Equipment Utilized During Treatment: Gait belt Activity Tolerance: Patient tolerated treatment well Patient left: in  bed;with call bell/phone within reach Nurse Communication: Mobility status PT Visit Diagnosis: Unsteadiness on feet (R26.81);Muscle weakness (generalized) (M62.81);Other abnormalities of gait and mobility (R26.89)     Time: 1610-9604 PT Time Calculation (min) (ACUTE ONLY): 15 min  Charges:  $Gait Training: 8-22 mins                     Lindalou Hose, PT DPT Acute Rehabilitation Services Office [phone]    Leonie Man 08/11/2022, 4:27 PM

## 2022-08-11 NOTE — Progress Notes (Signed)
Progress Note  2 Days Post-Op  Subjective: Feeling well.  Eating breakfast  Objective: Vital signs in last 24 hours: Temp:  [97.8 F (36.6 C)-98.4 F (36.9 C)] 98.4 F (36.9 C) (06/08 0809) Pulse Rate:  [61-72] 67 (06/08 0809) Resp:  [13-18] 13 (06/08 0809) BP: (117-140)/(53-95) 140/57 (06/08 0809) SpO2:  [91 %-96 %] 95 % (06/08 0809) Last BM Date : 08/08/22  Intake/Output from previous day: 06/07 0701 - 06/08 0700 In: -  Out: 800 [Urine:800] Intake/Output this shift: Total I/O In: -  Out: 825 [Urine:825]  PE: General: pleasant, WD, elderly female who is laying in bed in NAD HEENT: sclera anicteric  Lungs: Respiratory effort nonlabored Abd: soft, NT, mild distention, +BS, incisions C/D/I   Lab Results:  Recent Labs    08/10/22 0622 08/11/22 0206  WBC 7.3 5.9  HGB 7.6* 7.9*  HCT 24.3* 24.5*  PLT 119* 104*    BMET Recent Labs    08/10/22 0622 08/11/22 0206  NA 140 142  K 3.4* 4.1  CL 109 109  CO2 23 23  GLUCOSE 295* 206*  BUN 56* 50*  CREATININE 1.78* 1.58*  CALCIUM 7.3* 7.2*    PT/INR No results for input(s): "LABPROT", "INR" in the last 72 hours. CMP     Component Value Date/Time   NA 142 08/11/2022 0206   NA 142 11/16/2021 1006   K 4.1 08/11/2022 0206   CL 109 08/11/2022 0206   CO2 23 08/11/2022 0206   GLUCOSE 206 (H) 08/11/2022 0206   BUN 50 (H) 08/11/2022 0206   BUN 48 (H) 11/16/2021 1006   CREATININE 1.58 (H) 08/11/2022 0206   CREATININE 1.58 (H) 05/01/2022 1011   CALCIUM 7.2 (L) 08/11/2022 0206   PROT 4.6 (L) 08/11/2022 0206   ALBUMIN 2.2 (L) 08/11/2022 0206   AST 259 (H) 08/11/2022 0206   ALT 572 (H) 08/11/2022 0206   ALKPHOS 121 08/11/2022 0206   BILITOT 0.7 08/11/2022 0206   GFRNONAA 31 (L) 08/11/2022 0206   GFRAA 48 (L) 09/09/2019 1630   Lipase     Component Value Date/Time   LIPASE 103 (H) 08/09/2022 0714       Studies/Results: MR ABDOMEN MRCP WO CONTRAST  Result Date: 08/10/2022 CLINICAL DATA:  No  obstruction suspected. EXAM: MRI ABDOMEN WITHOUT CONTRAST  (INCLUDING MRCP) TECHNIQUE: Multiplanar multisequence MR imaging of the abdomen was performed. Heavily T2-weighted images of the biliary and pancreatic ducts were obtained, and three-dimensional MRCP images were rendered by post processing. COMPARISON:  CT and ultrasound 08/07/2022 FINDINGS: Lower chest:  Bilateral small to moderate pleural effusions. Hepatobiliary: No intrahepatic biliary duct dilatation. The common hepatic duct measures 7 mm in the common bile duct measures 6 mm (series 5). No filling defects within the common bile duct. Postsurgical change the gallbladder fossa. No fluid collections. Cystic duct remnant noted (image 18/4) normal liver parenchyma. Pancreas: Normal pancreatic parenchymal intensity. No ductal dilatation or inflammation. Spleen: Normal spleen. Adrenals/urinary tract: High signal intensity cysts within the kidneys on T2 weighted imaging. No follow-up recommended for presumed benign renal cysts. No IV contrast. Stomach/Bowel: Stomach and limited of the small bowel is unremarkable Vascular/Lymphatic: Abdominal aortic normal caliber. No retroperitoneal periportal lymphadenopathy. Musculoskeletal: No aggressive osseous lesion IMPRESSION: 1. No evidence of biliary obstruction. Common hepatic duct and common bile duct upper limits of normal. No choledocholithiasis. 2. Postsurgical change of cholecystectomy. Expected postsurgical change in the gallbladder fossa. No fluid collections. 3. Normal pancreas. 4. Bilateral pleural effusions. Electronically Signed   By:  Genevive Bi M.D.   On: 08/10/2022 11:52   DG Cholangiogram Operative  Result Date: 08/10/2022 CLINICAL DATA:  [id number] Surgery, elective [id number] EXAM: INTRAOPERATIVE CHOLANGIOGRAM COMPARISON:  US Abdomen and CT AP, 08/07/2022. FLUOROSCOPY: Exposure Index (as provided by the fluoroscopic device): 4.4 mGy Kerma FINDINGS: limited oblique planar images of the RIGHT upper  quadrant obtained C-arm. Images demonstrating laparoscopic instrumentation, cystic duct cannulation and antegrade cholangiogram. Spillage of contrast into the duodenum, however mild extrahepatic biliary dilatation with suspected small filling defects at the distal CBD about the ampulla. See key images. IMPRESSION: Fluoroscopic imaging for intraoperative cholangiogram. Question small biliary filling defects of the distal CBD about the ampulla, despite spillage of contrast into the duodenum. Findings suspicious for choledocholithiasis. If continued clinical concern consider MRCP and/or ERCP for further evaluation. Electronically Signed   By: Roanna Banning M.D.   On: 08/10/2022 07:26    Anti-infectives: Anti-infectives (From admission, onward)    Start     Dose/Rate Route Frequency Ordered Stop   08/09/22 1505  ceFAZolin (ANCEF) 2-4 GM/100ML-% IVPB       Note to Pharmacy: [facility], [place]: cabinet override      08/09/22 1505 08/10/22 0314   08/08/22 2200  metroNIDAZOLE (FLAGYL) IVPB 500 mg        500 mg 100 mL/hr over 60 Minutes Intravenous Every 12 hours 08/08/22 1504     08/08/22 2000  piperacillin-tazobactam (ZOSYN) IVPB 2.25 g  Status:  Discontinued        2.25 g 100 mL/hr over 30 Minutes Intravenous Every 6 hours 08/08/22 1418 08/08/22 1504   08/08/22 1600  ceFEPIme (MAXIPIME) 2 g in sodium chloride 0.9 % 100 mL IVPB        2 g 200 mL/hr over 30 Minutes Intravenous Every 24 hours 08/08/22 1504     08/08/22 0500  piperacillin-tazobactam (ZOSYN) IVPB 3.375 g  Status:  Discontinued        3.375 g 12.5 mL/hr over 240 Minutes Intravenous Every 8 hours 08/07/22 2230 08/08/22 1418   08/07/22 2030  piperacillin-tazobactam (ZOSYN) IVPB 3.375 g        3.375 g 100 mL/hr over 30 Minutes Intravenous  Once 08/07/22 2029 08/07/22 2125        Assessment/Plan  Gallstone pancreatitis  POD2 S/p laparoscopic cholecystectomy w/ IOC 6/6  - IOC suspicious for choledocholithiasis, but flushed easier after  initial difficulty - I wonder if I flushed a stone through - Postop MRCP negative for choledocholithiasis - LFTs improving - I would discharge patient home today if therapies feels she is safe for discharge (family to stay with her). Follow up with me in office.  Return for any recurrent issues.   LOS: 4 days     Quentin Ore, MD [facility] 08/11/2022, 8:59 AM Please see Amion for pager number during day hours 7:00am-4:30pm

## 2022-08-11 NOTE — Progress Notes (Addendum)
Patient ID: Shelby Walker, female   DOB: 03/13/1933, 89 y.o.   MRN: [id number]    Progress Note   Subjective   Day #4 CC; gallstone pancreatitis, elevated LFTs  MRCP was negative, no evidence for CBD stone Labs today-WBC 5.9/hemoglobin 7.9/hematocrit 24.5 T. bili 0.7/alk phos 121/AST 259/ALT 572-all improved  Patient sitting at bedside, family in room, her daughter says she ate a solid breakfast without any difficulty, no abdominal pain.   Objective   Vital signs in last 24 hours: Temp:  [97.8 F (36.6 C)-98.4 F (36.9 C)] 98.4 F (36.9 C) (06/08 0809) Pulse Rate:  [61-72] 67 (06/08 0809) Resp:  [13-18] 13 (06/08 0809) BP: (117-140)/(53-95) 140/57 (06/08 0809) SpO2:  [91 %-96 %] 95 % (06/08 0809) Last BM Date : 08/08/22 General:    Elderly white female in NAD Heart:  Regular rate and rhythm; no murmurs Lungs: Respirations even and unlabored, lungs CTA bilaterally Abdomen:  Soft, nontender and nondistended. Normal bowel sounds. Extremities:  Without edema. Neurologic:  Alert and oriented,  grossly normal neurologically. Psych:  Cooperative. Normal mood and affect.  Intake/Output from previous day: 06/07 0701 - 06/08 0700 In: -  Out: 800 [Urine:800] Intake/Output this shift: Total I/O In: -  Out: 825 [Urine:825]  Lab Results: Recent Labs    08/09/22 0714 08/10/22 0622 08/11/22 0206  WBC 7.8 7.3 5.9  HGB 8.4* 7.6* 7.9*  HCT 26.1* 24.3* 24.5*  PLT 133* 119* 104*   BMET Recent Labs    08/09/22 0714 08/10/22 0622 08/11/22 0206  NA 141 140 142  K 3.7 3.4* 4.1  CL 107 109 109  CO2 21* 23 23  GLUCOSE 192* 295* 206*  BUN 55* 56* 50*  CREATININE 1.78* 1.78* 1.58*  CALCIUM 7.5* 7.3* 7.2*   LFT Recent Labs    08/11/22 0206  PROT 4.6*  ALBUMIN 2.2*  AST 259*  ALT 572*  ALKPHOS 121  BILITOT 0.7   PT/INR No results for input(s): "LABPROT", "INR" in the last 72 hours.  Studies/Results: MR ABDOMEN MRCP WO CONTRAST  Result Date: 08/10/2022 CLINICAL DATA:   No obstruction suspected. EXAM: MRI ABDOMEN WITHOUT CONTRAST  (INCLUDING MRCP) TECHNIQUE: Multiplanar multisequence MR imaging of the abdomen was performed. Heavily T2-weighted images of the biliary and pancreatic ducts were obtained, and three-dimensional MRCP images were rendered by post processing. COMPARISON:  CT and ultrasound 08/07/2022 FINDINGS: Lower chest:  Bilateral small to moderate pleural effusions. Hepatobiliary: No intrahepatic biliary duct dilatation. The common hepatic duct measures 7 mm in the common bile duct measures 6 mm (series 5). No filling defects within the common bile duct. Postsurgical change the gallbladder fossa. No fluid collections. Cystic duct remnant noted (image 18/4) normal liver parenchyma. Pancreas: Normal pancreatic parenchymal intensity. No ductal dilatation or inflammation. Spleen: Normal spleen. Adrenals/urinary tract: High signal intensity cysts within the kidneys on T2 weighted imaging. No follow-up recommended for presumed benign renal cysts. No IV contrast. Stomach/Bowel: Stomach and limited of the small bowel is unremarkable Vascular/Lymphatic: Abdominal aortic normal caliber. No retroperitoneal periportal lymphadenopathy. Musculoskeletal: No aggressive osseous lesion IMPRESSION: 1. No evidence of biliary obstruction. Common hepatic duct and common bile duct upper limits of normal. No choledocholithiasis. 2. Postsurgical change of cholecystectomy. Expected postsurgical change in the gallbladder fossa. No fluid collections. 3. Normal pancreas. 4. Bilateral pleural effusions. Electronically Signed   By: Genevive Bi M.D.   On: 08/10/2022 11:52   DG Cholangiogram Operative  Result Date: 08/10/2022 CLINICAL DATA:  [id number] Surgery, elective [id number] EXAM:  INTRAOPERATIVE CHOLANGIOGRAM COMPARISON:  US Abdomen and CT AP, 08/07/2022. FLUOROSCOPY: Exposure Index (as provided by the fluoroscopic device): 4.4 mGy Kerma FINDINGS: limited oblique planar images of the RIGHT upper  quadrant obtained C-arm. Images demonstrating laparoscopic instrumentation, cystic duct cannulation and antegrade cholangiogram. Spillage of contrast into the duodenum, however mild extrahepatic biliary dilatation with suspected small filling defects at the distal CBD about the ampulla. See key images. IMPRESSION: Fluoroscopic imaging for intraoperative cholangiogram. Question small biliary filling defects of the distal CBD about the ampulla, despite spillage of contrast into the duodenum. Findings suspicious for choledocholithiasis. If continued clinical concern consider MRCP and/or ERCP for further evaluation. Electronically Signed   By: Roanna Banning M.D.   On: 08/10/2022 07:26       Assessment / Plan:    #61 89 year old white female admitted with chest pain/epigastric pain secondary to gallstone pancreatitis. She is status post laparoscopic cholecystectomy on 08/09/2022 with IOC suspicious for distal common bile duct stone  Subsequent MRI/MRCP yesterday was negative with no evidence of CBD stone, and 6 mm CBD.  Liver test had trended down yesterday and continue to further trend toward normalization today  Pt is doing well, no complaints of abdominal pain  Plan; continue to follow LFTs to normalization No indication for ERCP at this time GI will sign off and available if needed    Principal Problem:   Acute biliary pancreatitis Active Problems:   Paroxysmal atrial fibrillation (HCC)   Uncontrolled type 2 diabetes mellitus with hyperglycemia (HCC)   Rheumatoid arthritis (HCC)   Stage 3b chronic kidney disease (CKD) (HCC)   Sepsis (HCC)     LOS: 4 days   Amy Esterwood PA-C 08/11/2022, 10:16 AM   Attending physician's note   I have taken a history, reviewed the chart and examined the patient. I performed a substantive portion of this encounter, including complete performance of at least one of the key components, in conjunction with the APP. I agree with the APP's note, impression and  recommendations.   MRCP negative.  Per Dr. Dossie Der IOC was initially difficult, was suspicious for choledocholithiasis but flushed easily subsequently, possibly flushed the stone through.  LFT continue to downtrend Overall she is doing much better, tolerating diet and is ambulating  Continue to trend LFT until they normalize Follow-up with PCP and surgery postop  GI will sign off but available if have any questions  The patient was provided an opportunity to ask questions and all were answered. The patient agreed with the plan and demonstrated an understanding of the instructions.   [name] , MD [phone]

## 2022-08-11 NOTE — Progress Notes (Signed)
PROGRESS NOTE                                                                                                                                                                                                             Patient Demographics:    Shelby Walker, is a 89 y.o. female, DOB - [birth year], EXB:[id number]  Outpatient Primary MD for the patient is Adrian Prince, MD    LOS - 4  Admit date - 08/07/2022    Chief Complaint  Patient presents with   Chest Pain       Brief Narrative (HPI from H&P)    89 y.o. 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  Her workup was consistent with gallstone pancreatitis with possible ascending cholangitis, general surgery was consulted and she was admitted to the hospital.   Subjective:   Patient in bed, appears comfortable, denies any headache, no fever, no chest pain or pressure, no shortness of breath , no abdominal pain. No focal weakness.   Assessment  & Plan :   1. Acute biliary pancreatitis -with possible ascending cholangitis and sepsis.  She has shown good improvement with IV fluids and empiric IV antibiotics, also on stress dose steroids as she is chronically on steroids, steroids being tapered.  She underwent laparoscopic cholecystectomy and tolerated the procedure well on 08/09/2022, postop MRCP stable with no CBD stone, clinically better with LFTs trending down.  GI and general surgery on board.  Clinically doing better.  Advance activity, PT OT likely discharge in the next 1 to 2 days.  2. Sepsis   - Fever with rigors, mild tachycardia, SBP as low as 94, and lactate  3.5 in ED, ascending cholangitis follow cultures, IV fluids and IV Zosyn.  General surgery & GI on board.  Sepsis pathophysiology has resolved.    3. Type II DM  - A1c was 7.3% in November 2023, Check CBGs and use low-intensity SSI for now     CBG (last 3)  Recent Labs    08/10/22 2334 08/11/22 0353 08/11/22 0809  GLUCAP 228* 197* 189*  4.  AKI on CKD 3B with borderline hyperkalemia. - SCr is 1.45 on admission, hydrate, renal ultrasound, Foley catheter if needed, Lokelma and monitor.    5. PAF  - No longer anticoagulated d/t fall risk, Continue amiodarone, recent echocardiogram from few months ago noted with preserved EF, EKG here stable.  No chest pain at rest, cardiology to also evaluate, her heart conditions should not prohibit her from going to surgery.   6. Rheumatoid arthritis  - Managed with prednisone 5 mg daily placed with stress dose steroids.        Condition - Extremely Guarded  Family Communication  :   Both daughter and son updated in detail on 08/08/2022, they will understand that patient is extremely tenuous, she will require cholecystectomy, they understand that she is a high risk candidate for any surgical procedure.  They understand this risk and want to proceed with surgery.  Multiple family members updated at bedside on 08/09/2022.  Daughter [name] [phone]  on 08/10/2022, 08/11/2022    Code Status : DNR  Consults  : General surgery, GI, cardiology  PUD Prophylaxis : PPI   Procedures  :     MRCP -   1. No evidence of biliary obstruction. Common hepatic duct and common bile duct upper limits of normal. No choledocholithiasis. 2. Postsurgical change of cholecystectomy. Expected postsurgical change in the gallbladder fossa. No fluid collections. 3. Normal pancreas. 4. Bilateral pleural effusions.  Laparoscopic cholecystectomy by general surgery on 08/09/2022.    Recent echocardiogram in November 2023.  -      1. Left ventricular ejection fraction,  by estimation, is 50 to 55%. The left ventricle has low normal function.   2. Right ventricular systolic function is normal. The right ventricular size is normal. There is normal pulmonary artery systolic pressure.   3. Left atrial size was moderately dilated. No left atrial/left atrial appendage thrombus was detected.   4. The mitral valve is degenerative. Mild mitral valve regurgitation. No evidence of mitral stenosis.   5. The aortic valve is tricuspid. There is mild calcification of the aortic valve. There is mild thickening of the aortic valve. Aortic valve regurgitation is not visualized. Aortic valve sclerosis is present, with no evidence of aortic valve stenosis.   6. There is mild (Grade II) layered plaque involving the descending aorta and aortic arch.   EKG NSR, no acute ST changes.    RUQ [place] - 1. Cholelithiasis and/or gallbladder sludge, without evidence of acute cholecystitis. 2. Simple right renal cyst  CT -  Cholelithiasis with evidence of gallbladder wall thickening and likely wall edema. Right upper quadrant ultrasound may be helpful for further evaluation as this may represent acute cholecystitis. Diverticulosis without diverticulitis. Simple renal cysts bilaterally stable from prior exam. No further follow-up is recommended      Disposition Plan  :    Status is: Inpatient   DVT Prophylaxis  :    heparin injection 5,000 Units Start: 08/07/22 2215    Lab Results  Component Value Date   PLT 104 (L) 08/11/2022    Diet :  Diet Order             Diet NPO time specified Except for: Sips with Meds  Diet effective midnight           Diet Carb Modified Fluid consistency: Thin; Room service appropriate? Yes  Diet effective now  Inpatient Medications  Scheduled Meds:  acetaminophen  650 mg Oral Q6H   amiodarone  100 mg Oral Daily   amLODipine  10 mg Oral Daily   atorvastatin  20 mg Oral Daily   Chlorhexidine Gluconate Cloth  6 each Topical  Daily   cycloSPORINE  1 drop Both Eyes BID   diclofenac Sodium  4 g Topical TID AC & HS   docusate sodium  100 mg Oral BID   gabapentin  200 mg Oral TID   heparin  5,000 Units Subcutaneous Q8H   hydrocortisone sod succinate (SOLU-CORTEF) inj  50 mg Intravenous Q12H   insulin aspart  0-6 Units Subcutaneous Q4H   mirtazapine  7.5 mg Oral QHS   pantoprazole  40 mg Oral Daily   sodium chloride flush  3 mL Intravenous Q12H   Continuous Infusions:  ceFEPime (MAXIPIME) IV 2 g (08/10/22 1747)   metronidazole 500 mg (08/11/22 0850)   PRN Meds:.acetaminophen **OR** acetaminophen, fentaNYL (SUBLIMAZE) injection, methocarbamol, ondansetron **OR** ondansetron (ZOFRAN) IV, oxyCODONE, simethicone  Antibiotics  :    Anti-infectives (From admission, onward)    Start     Dose/Rate Route Frequency Ordered Stop   08/09/22 1505  ceFAZolin (ANCEF) 2-4 GM/100ML-% IVPB       Note to Pharmacy: [facility], [place]: cabinet override      08/09/22 1505 08/10/22 0314   08/08/22 2200  metroNIDAZOLE (FLAGYL) IVPB 500 mg        500 mg 100 mL/hr over 60 Minutes Intravenous Every 12 hours 08/08/22 1504     08/08/22 2000  piperacillin-tazobactam (ZOSYN) IVPB 2.25 g  Status:  Discontinued        2.25 g 100 mL/hr over 30 Minutes Intravenous Every 6 hours 08/08/22 1418 08/08/22 1504   08/08/22 1600  ceFEPIme (MAXIPIME) 2 g in sodium chloride 0.9 % 100 mL IVPB        2 g 200 mL/hr over 30 Minutes Intravenous Every 24 hours 08/08/22 1504     08/08/22 0500  piperacillin-tazobactam (ZOSYN) IVPB 3.375 g  Status:  Discontinued        3.375 g 12.5 mL/hr over 240 Minutes Intravenous Every 8 hours 08/07/22 2230 08/08/22 1418   08/07/22 2030  piperacillin-tazobactam (ZOSYN) IVPB 3.375 g        3.375 g 100 mL/hr over 30 Minutes Intravenous  Once 08/07/22 2029 08/07/22 2125         Objective:   Vitals:   08/10/22 2000 08/11/22 0000 08/11/22 0400 08/11/22 0809  BP: 121/65 (!) 129/53 (!) 117/95 (!) 140/57  Pulse: 72 66 61  67  Resp: 16 13 13 13   Temp: 98 F (36.7 C) 97.9 F (36.6 C) 97.8 F (36.6 C) 98.4 F (36.9 C)  TempSrc: Oral Oral Oral Oral  SpO2: 91% 92% 95% 95%  Weight:      Height:        Wt Readings from Last 3 Encounters:  08/08/22 50.8 kg  07/23/22 51.2 kg  05/28/22 50.3 kg     Intake/Output Summary (Last 24 hours) at 08/11/2022 1032 Last data filed at 08/11/2022 0813 Gross per 24 hour  Intake --  Output 1625 ml  Net -1625 ml     Physical Exam  Awake Alert, No new F.N deficits, Normal affect [place].AT,PERRAL Supple Neck, No JVD,   Symmetrical Chest wall movement, Good air movement bilaterally, CTAB RRR,No Gallops,Rubs or new Murmurs,  +ve B.Sounds, Abd Soft, No RUQ tenderness,   No Cyanosis, Clubbing or edema  Data Review:    Recent Labs  Lab 08/07/22 1710 08/08/22 0622 08/09/22 0714 08/10/22 0622 08/11/22 0206  WBC 9.6 9.3 7.8 7.3 5.9  HGB 9.2* 8.1* 8.4* 7.6* 7.9*  HCT 30.0* 25.4* 26.1* 24.3* 24.5*  PLT 194 159 133* 119* 104*  MCV 92.3 90.7 90.9 91.7 92.5  MCH 28.3 28.9 29.3 28.7 29.8  MCHC 30.7 31.9 32.2 31.3 32.2  RDW 16.1* 16.5* 16.2* 16.5* 16.6*  LYMPHSABS 0.6*  --  0.2* 0.2* 0.4*  MONOABS 0.2  --  0.0* 0.1 0.2  EOSABS 0.0  --  0.0 0.0 0.0  BASOSABS 0.0  --  0.2* 0.0 0.0    Recent Labs  Lab 08/07/22 1710 08/07/22 1915 08/07/22 2129 08/08/22 0620 08/08/22 0622 08/09/22 0714 08/10/22 0622 08/11/22 0206  NA 140  --   --   --  136 141 140 142  K 4.2  --   --   --  5.0 3.7 3.4* 4.1  CL 107  --   --   --  102 107 109 109  CO2 21*  --   --   --  21* 21* 23 23  ANIONGAP 12  --   --   --  13 13 8 10   GLUCOSE 228*  --   --   --  167* 192* 295* 206*  BUN 63*  --   --   --  64* 55* 56* 50*  CREATININE 1.45*  --   --   --  2.00* 1.78* 1.78* 1.58*  AST 310*  --   --   --  4,600* 1,286* 584* 259*  ALT 149*  --   --   --  2,504* 1,424* 896* 572*  ALKPHOS 59  --   --   --  155* 136* 136* 121  BILITOT 1.2  --   --   --  2.9* 1.7* 1.0 0.7  ALBUMIN 3.1*  --    --   --  2.6* 2.2* 2.2* 2.2*  CRP  --   --   --   --  7.2* 10.8* 8.5* 8.1*  PROCALCITON  --   --   --   --  51.50 34.93 29.92 22.41  LATICACIDVEN  --  3.5* 3.4*  --  1.6  --   --   --   HGBA1C  --   --   --  9.2*  --   --   --   --   BNP  --   --   --   --  365.7* 866.7* 963.7* 849.0*  MG  --   --   --   --  2.0 2.1 2.3 2.3  CALCIUM 8.5*  --   --   --  7.7* 7.5* 7.3* 7.2*      Recent Labs  Lab 08/07/22 1710 08/07/22 1915 08/07/22 2129 08/08/22 0620 08/08/22 0622 08/09/22 0714 08/10/22 0622 08/11/22 0206  CRP  --   --   --   --  7.2* 10.8* 8.5* 8.1*  PROCALCITON  --   --   --   --  51.50 34.93 29.92 22.41  LATICACIDVEN  --  3.5* 3.4*  --  1.6  --   --   --   HGBA1C  --   --   --  9.2*  --   --   --   --   BNP  --   --   --   --  365.7* 866.7* 963.7* 849.0*  MG  --   --   --   --  2.0 2.1 2.3 2.3  CALCIUM 8.5*  --   --   --  7.7* 7.5* 7.3* 7.2*    Radiology Reports MR ABDOMEN MRCP WO CONTRAST  Result Date: 08/10/2022 CLINICAL DATA:  No obstruction suspected. EXAM: MRI ABDOMEN WITHOUT CONTRAST  (INCLUDING MRCP) TECHNIQUE: Multiplanar multisequence MR imaging of the abdomen was performed. Heavily T2-weighted images of the biliary and pancreatic ducts were obtained, and three-dimensional MRCP images were rendered by post processing. COMPARISON:  CT and ultrasound 08/07/2022 FINDINGS: Lower chest:  Bilateral small to moderate pleural effusions. Hepatobiliary: No intrahepatic biliary duct dilatation. The common hepatic duct measures 7 mm in the common bile duct measures 6 mm (series 5). No filling defects within the common bile duct. Postsurgical change the gallbladder fossa. No fluid collections. Cystic duct remnant noted (image 18/4) normal liver parenchyma. Pancreas: Normal pancreatic parenchymal intensity. No ductal dilatation or inflammation. Spleen: Normal spleen. Adrenals/urinary tract: High signal intensity cysts within the kidneys on T2 weighted imaging. No follow-up recommended  for presumed benign renal cysts. No IV contrast. Stomach/Bowel: Stomach and limited of the small bowel is unremarkable Vascular/Lymphatic: Abdominal aortic normal caliber. No retroperitoneal periportal lymphadenopathy. Musculoskeletal: No aggressive osseous lesion IMPRESSION: 1. No evidence of biliary obstruction. Common hepatic duct and common bile duct upper limits of normal. No choledocholithiasis. 2. Postsurgical change of cholecystectomy. Expected postsurgical change in the gallbladder fossa. No fluid collections. 3. Normal pancreas. 4. Bilateral pleural effusions. Electronically Signed   By: Genevive Bi M.D.   On: 08/10/2022 11:52   DG Cholangiogram Operative  Result Date: 08/10/2022 CLINICAL DATA:  [id number] Surgery, elective [id number] EXAM: INTRAOPERATIVE CHOLANGIOGRAM COMPARISON:  US Abdomen and CT AP, 08/07/2022. FLUOROSCOPY: Exposure Index (as provided by the fluoroscopic device): 4.4 mGy Kerma FINDINGS: limited oblique planar images of the RIGHT upper quadrant obtained C-arm. Images demonstrating laparoscopic instrumentation, cystic duct cannulation and antegrade cholangiogram. Spillage of contrast into the duodenum, however mild extrahepatic biliary dilatation with suspected small filling defects at the distal CBD about the ampulla. See key images. IMPRESSION: Fluoroscopic imaging for intraoperative cholangiogram. Question small biliary filling defects of the distal CBD about the ampulla, despite spillage of contrast into the duodenum. Findings suspicious for choledocholithiasis. If continued clinical concern consider MRCP and/or ERCP for further evaluation. Electronically Signed   By: Roanna Banning M.D.   On: 08/10/2022 07:26   US RENAL  Result Date: 08/08/2022 CLINICAL DATA:  Acute kidney injury EXAM: RENAL / URINARY TRACT ULTRASOUND COMPLETE COMPARISON:  CT abdomen pelvis with contrast 08/07/2022 FINDINGS: Right Kidney: Renal measurements: 9.7 x 4.1 x 4.4 cm = volume: 93 mL. Mild increased  echogenicity the renal cortex without significant thinning. No mass or hydronephrosis visualized. 2.7 cm simple cyst in the upper pole does not require dedicated imaging follow-up. Left Kidney: Renal measurements: 9.6 x 4.9 x 4.8 cm = volume: 120 mL. Mild increased echogenicity the renal cortex without significant thinning. No mass or hydronephrosis visualized. Bladder: Appears normal for degree of bladder distention. Other: None. IMPRESSION: Mild increased echogenicity of the renal cortices without significant thinning indicative of medical renal disease. Electronically Signed   By: Acquanetta Belling M.D.   On: 08/08/2022 11:31   DG Chest Port 1 View  Result Date: 08/08/2022 CLINICAL DATA:  Shortness of breath EXAM: PORTABLE CHEST 1 VIEW COMPARISON:  Yesterday FINDINGS: Hazy density at the left base, atelectasis or scarring. Streak normal heart size and mediastinal contours. There is no edema, consolidation, effusion, or pneumothorax. Which is IMPRESSION: Mild scarring/atelectasis at the left  lung base. No change from prior. Electronically Signed   By: Tiburcio Pea M.D.   On: 08/08/2022 06:42   US Abdomen Limited RUQ (LIVER/GB)  Result Date: 08/07/2022 CLINICAL DATA:  Right upper quadrant pain. EXAM: ULTRASOUND ABDOMEN LIMITED RIGHT UPPER QUADRANT COMPARISON:  Aug 02, 2018 FINDINGS: Gallbladder: Small echogenic gallstones and/or gallbladder sludge is suspected within the region of the gallbladder fundus. There is no evidence of gallbladder wall thickening (2.5 mm) no sonographic Murphy sign noted by sonographer. Common bile duct: Diameter: 7.1 mm Liver: No focal lesion identified. Within normal limits in parenchymal echogenicity. Portal vein is patent on color Doppler imaging with normal direction of blood flow towards the liver. Other: A 2.6 cm x 1.8 cm x 2.1 cm simple cyst is seen within the upper pole of the right kidney. IMPRESSION: 1. Cholelithiasis and/or gallbladder sludge, without evidence of acute  cholecystitis. 2. Simple right renal cyst. Electronically Signed   By: Aram Candela M.D.   On: 08/07/2022 21:17   CT ABDOMEN PELVIS W CONTRAST  Result Date: 08/07/2022 CLINICAL DATA:  Epigastric pain EXAM: CT ABDOMEN AND PELVIS WITH CONTRAST TECHNIQUE: Multidetector CT imaging of the abdomen and pelvis was performed using the standard protocol following bolus administration of intravenous contrast. RADIATION DOSE REDUCTION: This exam was performed according to the departmental dose-optimization program which includes automated exposure control, adjustment of the mA and/or kV according to patient size and/or use of iterative reconstruction technique. CONTRAST:  50mL OMNIPAQUE IOHEXOL 350 MG/ML SOLN COMPARISON:  01/24/2022 FINDINGS: Lower chest: Lung bases show mild left basilar atelectasis. No focal infiltrate or effusion is seen. Hepatobiliary: Liver is well visualized and within normal limits. Gallbladder is well distended with some dependent density consistent with small gallstones. Wall thickening and likely wall edema is noted. No obstructive changes are seen. Pancreas: Unremarkable. No pancreatic ductal dilatation or surrounding inflammatory changes. Spleen: Normal in size without focal abnormality. Adrenals/Urinary Tract: Adrenal glands are within normal limits. Kidneys are well visualized bilaterally within normal enhancement pattern. No renal calculi are noted. Simple appearing right renal cyst is noted measuring 2.8 cm. This is similar to that seen on the prior exam. No further follow-up is recommended. Smaller left renal cyst is noted also stable from the prior study. No obstructive changes are seen. The bladder is well distended. Stomach/Bowel: Scattered diverticular change of the colon is noted without evidence of diverticulitis. The appendix is within normal limits. No obstructive changes of the colon are seen. Small bowel and stomach are within normal limits. Vascular/Lymphatic: Aortic  atherosclerosis. No enlarged abdominal or pelvic lymph nodes. Reproductive: Uterus and bilateral adnexa are unremarkable. Other: No abdominal wall hernia or abnormality. No abdominopelvic ascites. Musculoskeletal: Mild degenerative changes of lumbar spine are noted with anterolisthesis of L4 on L5. IMPRESSION: Cholelithiasis with evidence of gallbladder wall thickening and likely wall edema. Right upper quadrant ultrasound may be helpful for further evaluation as this may represent acute cholecystitis. Diverticulosis without diverticulitis. Simple renal cysts bilaterally stable from prior exam. No further follow-up is recommended. Electronically Signed   By: Alcide Clever M.D.   On: 08/07/2022 20:33   DG Chest Portable 1 View  Result Date: 08/07/2022 CLINICAL DATA:  Epigastric pain EXAM: PORTABLE CHEST 1 VIEW COMPARISON:  Chest x-ray 01/29/2022 FINDINGS: The heart size and mediastinal contours are within normal limits. Both lungs are clear. There is blunting of the left costophrenic angle. There is no pneumothorax. The visualized skeletal structures are unremarkable. IMPRESSION: Blunting of the left costophrenic angle may  be due to pleural thickening or small pleural effusion. Electronically Signed   By: Darliss Cheney M.D.   On: 08/07/2022 18:01      Signature  -   Susa Raring M.D on 08/11/2022 at 10:32 AM   -  To page go to [email or web address]

## 2022-08-12 DIAGNOSIS — K851 Biliary acute pancreatitis without necrosis or infection: Secondary | ICD-10-CM | POA: Diagnosis not present

## 2022-08-12 LAB — MAGNESIUM: Magnesium: 2.3 mg/dL (ref 1.7–2.4)

## 2022-08-12 LAB — CBC WITH DIFFERENTIAL/PLATELET
Abs Immature Granulocytes: 0.03 10*3/uL (ref 0.00–0.07)
Basophils Absolute: 0 10*3/uL (ref 0.0–0.1)
Basophils Relative: 0 %
Eosinophils Absolute: 0.4 10*3/uL (ref 0.0–0.5)
Eosinophils Relative: 6 %
HCT: 24.4 % — ABNORMAL LOW (ref 36.0–46.0)
Hemoglobin: 7.9 g/dL — ABNORMAL LOW (ref 12.0–15.0)
Immature Granulocytes: 1 %
Lymphocytes Relative: 13 %
Lymphs Abs: 0.9 10*3/uL (ref 0.7–4.0)
MCH: 30.3 pg (ref 26.0–34.0)
MCHC: 32.4 g/dL (ref 30.0–36.0)
MCV: 93.5 fL (ref 80.0–100.0)
Monocytes Absolute: 0.3 10*3/uL (ref 0.1–1.0)
Monocytes Relative: 4 %
Neutro Abs: 5.1 10*3/uL (ref 1.7–7.7)
Neutrophils Relative %: 76 %
Platelets: 90 10*3/uL — ABNORMAL LOW (ref 150–400)
RBC: 2.61 MIL/uL — ABNORMAL LOW (ref 3.87–5.11)
RDW: 16.6 % — ABNORMAL HIGH (ref 11.5–15.5)
WBC: 6.6 10*3/uL (ref 4.0–10.5)
nRBC: 0 % (ref 0.0–0.2)

## 2022-08-12 LAB — COMPREHENSIVE METABOLIC PANEL
ALT: 305 U/L — ABNORMAL HIGH (ref 0–44)
AST: 87 U/L — ABNORMAL HIGH (ref 15–41)
Albumin: 1.9 g/dL — ABNORMAL LOW (ref 3.5–5.0)
Alkaline Phosphatase: 97 U/L (ref 38–126)
Anion gap: 7 (ref 5–15)
BUN: 39 mg/dL — ABNORMAL HIGH (ref 8–23)
CO2: 23 mmol/L (ref 22–32)
Calcium: 7.1 mg/dL — ABNORMAL LOW (ref 8.9–10.3)
Chloride: 112 mmol/L — ABNORMAL HIGH (ref 98–111)
Creatinine, Ser: 1.43 mg/dL — ABNORMAL HIGH (ref 0.44–1.00)
GFR, Estimated: 35 mL/min — ABNORMAL LOW (ref >60)
Glucose, Bld: 153 mg/dL — ABNORMAL HIGH (ref 70–99)
Potassium: 3.2 mmol/L — ABNORMAL LOW (ref 3.5–5.1)
Sodium: 142 mmol/L (ref 135–145)
Total Bilirubin: 0.7 mg/dL (ref 0.3–1.2)
Total Protein: 4.3 g/dL — ABNORMAL LOW (ref 6.5–8.1)

## 2022-08-12 LAB — GLUCOSE, CAPILLARY
Glucose-Capillary: 113 mg/dL — ABNORMAL HIGH (ref 70–99)
Glucose-Capillary: 147 mg/dL — ABNORMAL HIGH (ref 70–99)
Glucose-Capillary: 251 mg/dL — ABNORMAL HIGH (ref 70–99)

## 2022-08-12 LAB — BRAIN NATRIURETIC PEPTIDE: B Natriuretic Peptide: 991.2 pg/mL — ABNORMAL HIGH (ref 0.0–100.0)

## 2022-08-12 LAB — C-REACTIVE PROTEIN: CRP: 8 mg/dL — ABNORMAL HIGH (ref <1.0)

## 2022-08-12 LAB — PROCALCITONIN: Procalcitonin: 11.89 ng/mL

## 2022-08-12 MED ORDER — ACETAMINOPHEN 500 MG PO TABS: 500.0000 mg | ORAL_TABLET | Freq: Three times a day (TID) | ORAL | 0 refills | Status: DC | PRN

## 2022-08-12 MED ORDER — AMLODIPINE BESYLATE 10 MG PO TABS: 10.0000 mg | ORAL_TABLET | Freq: Every day | ORAL | 0 refills | Status: DC

## 2022-08-12 MED ORDER — METRONIDAZOLE 500 MG PO TABS: 500.0000 mg | ORAL_TABLET | Freq: Three times a day (TID) | ORAL | 0 refills | Status: AC

## 2022-08-12 MED ORDER — POTASSIUM CHLORIDE CRYS ER 20 MEQ PO TBCR
40.0000 meq | EXTENDED_RELEASE_TABLET | Freq: Once | ORAL | Status: AC
  Administered 2022-08-12: 40 meq via ORAL
  Filled 2022-08-12: qty 2

## 2022-08-12 MED ORDER — LEVOFLOXACIN 500 MG PO TABS: 500.0000 mg | ORAL_TABLET | Freq: Every day | ORAL | 0 refills | Status: AC

## 2022-08-12 NOTE — TOC Transition Note (Signed)
Transition of Care [facility]) - CM/SW Discharge Note   Patient Details  Name: Shelby Walker MRN: [id number] Date of Birth: 1933/04/12  [facility] CM/SW Contact:  Lawerance Sabal, RN Phone Number: 08/12/2022, 9:12 AM   Clinical Narrative:     Spoke w patient at bedside. She states that she has a RW at home, no additional DME needs.  Notified [facility] that she will DC today.   Final next level of care: Home w Home Health Services Barriers to Discharge: No Barriers Identified   Patient Goals and CMS Choice CMS Medicare.gov Compare Post Acute Care list provided to:: Other (Comment Required) (N/A  Storm[facility] Card for [facility] No choice needed) Choice offered to / list presented to : NA  Discharge Placement                         Discharge Plan and Services Additional resources added to the After Visit Summary for       Post Acute Care Choice: Home Health          DME Arranged: N/A DME Agency: NA       HH Arranged: PT, OT HH Agency: [facility] Date HH Agency Contacted: 08/12/22 Time HH Agency Contacted: [phone] Representative spoke with at [facility] Agency: Rivka Barbara  Social Determinants of Health (SDOH) Interventions SDOH Screenings   Food Insecurity: No Food Insecurity (08/08/2022)  Housing: Low Risk  (08/08/2022)  Transportation Needs: No Transportation Needs (08/08/2022)  Utilities: Not At Risk (08/08/2022)  Depression (PHQ2-9): Low Risk  (05/28/2022)  Financial Resource Strain: Low Risk  (07/24/2018)  Physical Activity: Sufficiently Active (07/24/2018)  Social Connections: Socially Integrated (07/24/2018)  Stress: No Stress Concern Present (07/24/2018)  Tobacco Use: Low Risk  (08/10/2022)     Readmission Risk Interventions    02/02/2022    1:25 PM  Readmission Risk Prevention Plan  HRI or Home Care Consult Complete  Social Work Consult for Recovery Care Planning/Counseling Complete  Palliative Care Screening Not Applicable  Medication Review Oceanographer)  Complete

## 2022-08-12 NOTE — Progress Notes (Signed)
Progress Note  3 Days Post-Op  Subjective: Good spirits.  Some dizziness when up  Objective: Vital signs in last 24 hours: Temp:  [97.8 F (36.6 C)-98.3 F (36.8 C)] 98 F (36.7 C) (06/09 0438) Pulse Rate:  [62-67] 62 (06/09 0438) Resp:  [13-19] 16 (06/09 0438) BP: (121-150)/(46-54) 121/51 (06/09 0438) SpO2:  [95 %-99 %] 95 % (06/08 1957) Weight:  [50.9 kg] 50.9 kg (06/09 0439) Last BM Date : 08/08/22  Intake/Output from previous day: 06/08 0701 - 06/09 0700 In: -  Out: 976 [Urine:976] Intake/Output this shift: No intake/output data recorded.  PE: General: pleasant, WD, elderly female who is laying in bed in NAD HEENT: sclera anicteric  Lungs: Respiratory effort nonlabored Abd: soft, NT, mild distention, +BS, incisions C/D/I   Lab Results:  Recent Labs    08/11/22 0206 08/12/22 0415  WBC 5.9 6.6  HGB 7.9* 7.9*  HCT 24.5* 24.4*  PLT 104* 90*    BMET Recent Labs    08/11/22 0206 08/12/22 0415  NA 142 142  K 4.1 3.2*  CL 109 112*  CO2 23 23  GLUCOSE 206* 153*  BUN 50* 39*  CREATININE 1.58* 1.43*  CALCIUM 7.2* 7.1*    PT/INR No results for input(s): "LABPROT", "INR" in the last 72 hours. CMP     Component Value Date/Time   NA 142 08/12/2022 0415   NA 142 11/16/2021 1006   K 3.2 (L) 08/12/2022 0415   CL 112 (H) 08/12/2022 0415   CO2 23 08/12/2022 0415   GLUCOSE 153 (H) 08/12/2022 0415   BUN 39 (H) 08/12/2022 0415   BUN 48 (H) 11/16/2021 1006   CREATININE 1.43 (H) 08/12/2022 0415   CREATININE 1.58 (H) 05/01/2022 1011   CALCIUM 7.1 (L) 08/12/2022 0415   PROT 4.3 (L) 08/12/2022 0415   ALBUMIN 1.9 (L) 08/12/2022 0415   AST 87 (H) 08/12/2022 0415   ALT 305 (H) 08/12/2022 0415   ALKPHOS 97 08/12/2022 0415   BILITOT 0.7 08/12/2022 0415   GFRNONAA 35 (L) 08/12/2022 0415   GFRAA 48 (L) 09/09/2019 1630   Lipase     Component Value Date/Time   LIPASE 103 (H) 08/09/2022 0714       Studies/Results: MR ABDOMEN MRCP WO CONTRAST  Result  Date: 08/10/2022 CLINICAL DATA:  No obstruction suspected. EXAM: MRI ABDOMEN WITHOUT CONTRAST  (INCLUDING MRCP) TECHNIQUE: Multiplanar multisequence MR imaging of the abdomen was performed. Heavily T2-weighted images of the biliary and pancreatic ducts were obtained, and three-dimensional MRCP images were rendered by post processing. COMPARISON:  CT and ultrasound 08/07/2022 FINDINGS: Lower chest:  Bilateral small to moderate pleural effusions. Hepatobiliary: No intrahepatic biliary duct dilatation. The common hepatic duct measures 7 mm in the common bile duct measures 6 mm (series 5). No filling defects within the common bile duct. Postsurgical change the gallbladder fossa. No fluid collections. Cystic duct remnant noted (image 18/4) normal liver parenchyma. Pancreas: Normal pancreatic parenchymal intensity. No ductal dilatation or inflammation. Spleen: Normal spleen. Adrenals/urinary tract: High signal intensity cysts within the kidneys on T2 weighted imaging. No follow-up recommended for presumed benign renal cysts. No IV contrast. Stomach/Bowel: Stomach and limited of the small bowel is unremarkable Vascular/Lymphatic: Abdominal aortic normal caliber. No retroperitoneal periportal lymphadenopathy. Musculoskeletal: No aggressive osseous lesion IMPRESSION: 1. No evidence of biliary obstruction. Common hepatic duct and common bile duct upper limits of normal. No choledocholithiasis. 2. Postsurgical change of cholecystectomy. Expected postsurgical change in the gallbladder fossa. No fluid collections. 3. Normal pancreas. 4.  Bilateral pleural effusions. Electronically Signed   By: Genevive Bi M.D.   On: 08/10/2022 11:52    Anti-infectives: Anti-infectives (From admission, onward)    Start     Dose/Rate Route Frequency Ordered Stop   08/12/22 0000  metroNIDAZOLE (FLAGYL) 500 MG tablet        500 mg Oral 3 times daily 08/12/22 0856 08/26/22 2359   08/12/22 0000  levofloxacin (LEVAQUIN) 500 MG tablet         500 mg Oral Daily 08/12/22 0856 08/16/22 2359   08/09/22 1505  ceFAZolin (ANCEF) 2-4 GM/100ML-% IVPB       Note to Pharmacy: [facility], [place]: cabinet override      08/09/22 1505 08/10/22 0314   08/08/22 2200  metroNIDAZOLE (FLAGYL) IVPB 500 mg        500 mg 100 mL/hr over 60 Minutes Intravenous Every 12 hours 08/08/22 1504     08/08/22 2000  piperacillin-tazobactam (ZOSYN) IVPB 2.25 g  Status:  Discontinued        2.25 g 100 mL/hr over 30 Minutes Intravenous Every 6 hours 08/08/22 1418 08/08/22 1504   08/08/22 1600  ceFEPIme (MAXIPIME) 2 g in sodium chloride 0.9 % 100 mL IVPB        2 g 200 mL/hr over 30 Minutes Intravenous Every 24 hours 08/08/22 1504     08/08/22 0500  piperacillin-tazobactam (ZOSYN) IVPB 3.375 g  Status:  Discontinued        3.375 g 12.5 mL/hr over 240 Minutes Intravenous Every 8 hours 08/07/22 2230 08/08/22 1418   08/07/22 2030  piperacillin-tazobactam (ZOSYN) IVPB 3.375 g        3.375 g 100 mL/hr over 30 Minutes Intravenous  Once 08/07/22 2029 08/07/22 2125        Assessment/Plan  Gallstone pancreatitis  POD2 S/p laparoscopic cholecystectomy w/ IOC 6/6  - IOC suspicious for choledocholithiasis, but flushed easier after initial difficulty - I wonder if I flushed a stone through - Postop MRCP negative for choledocholithiasis - LFTs continue to improve - Okay for discharge from surgery perspective - safe discharge planning in progress   LOS: 5 days     Quentin Ore, MD [facility] 08/12/2022, 8:59 AM Please see Amion for pager number during day hours 7:00am-4:30pm

## 2022-08-12 NOTE — Progress Notes (Signed)
Occupational Therapy Treatment Patient Details Name: Shelby Walker MRN: [id number] DOB: 08-24-1933 Today's Date: 08/12/2022   History of present illness 89 y/o F admitted to [facility] on 6/4 for epigastric pain, nausea, vomiting, and shaking chills. CT of abdomen shows cholelithiasis with evidence of gallbladder wall thickening, RUQ ultrasound without evidence of acute cholecystitis but gallbladder sludge and cholelithiasis. Plans for lap chole 6/6. PMHx: RA, DM, CKD 3B, mitral regurgitation, PAF, pacemaker implantation removed due to MSSA bacteremia, recurrent c diff   OT comments  Patient received in supine and agreeable to OT session. Patient able to get to EOB with supervision and asked to use bathroom. Patient was able to ambulate to bathroom with min guard assist but experienced a LOB when turning and required min assist for balance and again when exiting bathroom. Patient performed self care seated at sink and ambulated to recliner. Discharge continues to be appropriate. Acute OT to continue to follow.    Recommendations for follow up therapy are one component of a multi-disciplinary discharge planning process, led by the attending physician.  Recommendations may be updated based on patient status, additional functional criteria and insurance authorization.    Assistance Recommended at Discharge Frequent or constant Supervision/Assistance  Patient can return home with the following  A little help with walking and/or transfers;A lot of help with bathing/dressing/bathroom;Assistance with cooking/housework;Assist for transportation;Help with stairs or ramp for entrance   Equipment Recommendations  None recommended by OT    Recommendations for Other Services      Precautions / Restrictions Precautions Precautions: Fall;Other (comment) Precaution Comments: watch O2, urinary and bowel incontinence Restrictions Weight Bearing Restrictions: No       Mobility Bed Mobility Overal bed mobility:  Needs Assistance Bed Mobility: Supine to Sit     Supine to sit: HOB elevated, Supervision     General bed mobility comments: supervision for safety    Transfers Overall transfer level: Needs assistance Equipment used: Rolling walker (2 wheels) Transfers: Sit to/from Stand, Bed to chair/wheelchair/BSC Sit to Stand: Min guard, Supervision           General transfer comment: min assist for balance when performing toilet transfer and min guard for recliner     Balance Overall balance assessment: Needs assistance Sitting-balance support: No upper extremity supported, Feet supported Sitting balance-Leahy Scale: Good     Standing balance support: Bilateral upper extremity supported, During functional activity, Reliant on assistive device for balance Standing balance-Leahy Scale: Poor Standing balance comment: reliant on UE support                           ADL either performed or assessed with clinical judgement   ADL Overall ADL's : Needs assistance/impaired     Grooming: Modified independent;Sitting Grooming Details (indicate cue type and reason): at sink Upper Body Bathing: Supervision/ safety;Sitting Upper Body Bathing Details (indicate cue type and reason): at sink             Toilet Transfer: Minimal assistance;Ambulation;Regular Toilet;Grab bars;Rolling walker (2 wheels) Toilet Transfer Details (indicate cue type and reason): Patient ambulated to bathroom and required min assist for transfer due to LOB when attempting to turn Toileting- Clothing Manipulation and Hygiene: Minimal assistance;Sit to/from stand Toileting - Clothing Manipulation Details (indicate cue type and reason): patient able to perform toilet hygiene seated and min assist to manage underwear       General ADL Comments: Patient experiecing LOB during toilet transfer    Extremity/Trunk  Assessment              Vision       Perception     Praxis      Cognition  Arousal/Alertness: Awake/alert Behavior During Therapy: WFL for tasks assessed/performed Overall Cognitive Status: Within Functional Limits for tasks assessed                                          Exercises      Shoulder Instructions       General Comments BP in supine 150/54 and seated at end of session 155/65    Pertinent Vitals/ Pain       Pain Assessment Pain Assessment: No/denies pain  Home Living                                          Prior Functioning/Environment              Frequency  Min 2X/week        Progress Toward Goals  OT Goals(current goals can now be found in the care plan section)  Progress towards OT goals: Progressing toward goals  Acute Rehab OT Goals Patient Stated Goal: get better OT Goal Formulation: With patient/family Time For Goal Achievement: 08/22/22 Potential to Achieve Goals: Good ADL Goals Pt Will Perform Grooming: with supervision;standing Pt Will Perform Upper Body Bathing: with supervision;sitting Pt Will Perform Lower Body Bathing: with min guard assist;sit to/from stand Pt Will Transfer to Toilet: with modified independence;ambulating;regular height toilet Pt Will Perform Toileting - Clothing Manipulation and hygiene: with min guard assist;sit to/from stand  Plan Discharge plan remains appropriate    Co-evaluation                 AM-PAC OT "6 Clicks" Daily Activity     Outcome Measure   Help from another person eating meals?: None Help from another person taking care of personal grooming?: A Little Help from another person toileting, which includes using toliet, bedpan, or urinal?: A Lot Help from another person bathing (including washing, rinsing, drying)?: A Lot Help from another person to put on and taking off regular upper body clothing?: A Little Help from another person to put on and taking off regular lower body clothing?: A Little 6 Click Score: 17    End of  Session Equipment Utilized During Treatment: Gait belt;Rolling walker (2 wheels)  OT Visit Diagnosis: Unsteadiness on feet (R26.81);Muscle weakness (generalized) (M62.81)   Activity Tolerance Patient tolerated treatment well   Patient Left in chair;with call bell/phone within reach;with chair alarm set   Nurse Communication Mobility status        Time: [phone] OT Time Calculation (min): 31 min  Charges: OT General Charges $OT Visit: 1 Visit OT Treatments $Self Care/Home Management : 23-37 mins  Alfonse Flavors, OTA Acute Rehabilitation Services  Office [phone]   Dewain Penning 08/12/2022, 11:34 AM

## 2022-08-12 NOTE — Discharge Summary (Signed)
Shelby Walker:[id number] DOB: 09-24-1933 DOA: 08/07/2022  PCP: Adrian Prince, MD  Admit date: 08/07/2022  Discharge date: 08/12/2022  Admitted From: Home   Disposition:  Home   Recommendations for Outpatient Follow-up:   Follow up with PCP in 1-2 weeks  PCP Please obtain BMP/CBC, 2 view CXR in 1week,  (see Discharge instructions)   PCP Please follow up on the following pending results: Check CBC, CMP, magnesium in 7 to 10 days.   Home Health: PT, OT   Equipment/Devices: as below  Consultations: Cards, GI, CCS Discharge Condition: Stable    CODE STATUS: Full    Diet Recommendation: Heart Healthy Low Carb    Chief Complaint  Patient presents with   Chest Pain     Brief history of present illness from the day of admission and additional interim summary    89 y.o. 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  Her workup was consistent with gallstone pancreatitis with possible ascending cholangitis, general surgery was consulted and she was admitted to the hospital.                                                                  Hospital Course   1. Acute biliary pancreatitis -with possible ascending cholangitis and sepsis.  She has shown good improvement with IV fluids and empiric IV antibiotics, also on stress dose steroids as she is chronically on steroids, steroids being tapered.  She underwent laparoscopic cholecystectomy and tolerated the procedure well on 08/09/2022, postop MRCP stable with  no CBD stone, clinically better with LFTs trending down.  GI and general surgery on board.  Clinically doing better. By general surgery and GI for discharge, will be discharged home on oral antibiotics as below with home PT OT, follow-up with PCP within a week of discharge along with GI and general surgery.   2. Sepsis with Enterobacter bacteremia- present on admission resolved now, 4 more days of oral Levaquin and 5 days of oral Flagyl upon discharge.     3. Type II DM  - A1c was 7.3% in November 2023, continue home regimen.  4.  AKI on CKD 3B with borderline hyperkalemia. - SCr is 1.45 on admission, improved after hydration and Lokelma, PCP to monitor.   5. PAF  - No longer anticoagulated d/t fall risk, Continue amiodarone, recent echocardiogram from few months ago noted with preserved EF, EKG here stable.  No acute issues here.  Was seen by her cardiologist.   6. Rheumatoid arthritis  - Managed with prednisone 5 mg daily placed  back on home dose upon discharge.   Discharge diagnosis     Principal Problem:   Acute biliary pancreatitis Active Problems:   Paroxysmal atrial fibrillation (HCC)   Uncontrolled type 2 diabetes mellitus with hyperglycemia (HCC)   Rheumatoid arthritis (HCC)   Stage 3b chronic kidney disease (CKD) (HCC)   Sepsis [facility])    Discharge instructions    Discharge Instructions     Discharge instructions   Complete by: As directed    Follow with Primary MD Adrian Prince, MD in 7 days, kindly review and follow instructions given by general surgery as well.  Get CBC, CMP, magnesium,-  checked next visit with your primary MD   Activity: As tolerated with Full fall precautions use walker/cane & assistance as needed  Disposition Home   Diet: Heart Healthy Low Carb, check CBGs q. ACH S.  Special Instructions: If you have smoked or chewed Tobacco  in the last 2 yrs please stop smoking, stop any regular Alcohol  and or any Recreational drug use.  On your next  visit with your primary care physician please Get Medicines reviewed and adjusted.  Please request your Prim.MD to go over all Hospital Tests and Procedure/Radiological results at the follow up, please get all Hospital records sent to your Prim MD by signing hospital release before you go home.  If you experience worsening of your admission symptoms, develop shortness of breath, life threatening emergency, suicidal or homicidal thoughts you must seek medical attention immediately by calling 911 or calling your MD immediately  if symptoms less severe.  You Must read complete instructions/literature along with all the possible adverse reactions/side effects for all the Medicines you take and that have been prescribed to you. Take any new Medicines after you have completely understood and accpet all the possible adverse reactions/side effects.   Increase activity slowly   Complete by: As directed        Discharge Medications   Allergies as of 08/12/2022       Reactions   Cholestyramine    Possible- Makes throat burn. Patient said she can't take it    Compazine [prochlorperazine Edisylate] Swelling   REACTION: " tongue swell and unable to swallow"   Celecoxib Diarrhea   Sulfonamide Derivatives Other (See Comments)   REACTION: " broke out with fine itching bumps"   Hydrocodone Other (See Comments)   Infliximab Other (See Comments)   Other reaction(s): rash   Leflunomide Other (See Comments)   Other reaction(s): diarrhea   Prochlorperazine Other (See Comments)   Other reaction(s): Unknown Other Reaction(s): tongue swells   Rosuvastatin    Other reaction(s): muscle aches   Sulfa Antibiotics Other (See Comments)   Other reaction(s): tongue swelling        Medication List     TAKE these medications    acetaminophen 500 MG tablet Commonly known as: TYLENOL Take 1 tablet (500 mg total) by mouth every 8 (eight) hours as needed. What changed:  how much to take when to take  this reasons to take this   amiodarone 200 MG tablet Commonly known as: PACERONE Take 0.5 tablets (100 mg total) by mouth daily.   amLODipine 10 MG tablet Commonly known as: NORVASC Take 1 tablet (10 mg total) by mouth daily.   atorvastatin 20 MG tablet Commonly known as: LIPITOR Take 1 tablet (20 mg total) by mouth daily.   bismuth subsalicylate 262 MG/15ML suspension Commonly known as: PEPTO BISMOL Take 30 mLs by mouth daily at 6 (six)  AM.   Calcium Carbonate-Vitamin D 600-400 MG-UNIT tablet Take 1 tablet by mouth daily at 12 noon.   CENTRUM SILVER 50+WOMEN PO Take 1 tablet by mouth daily.   diclofenac Sodium 1 % Gel Commonly known as: VOLTAREN Apply 4 g topically 4 (four) times daily.   gabapentin 100 MG capsule Commonly known as: NEURONTIN Take 1 capsule (100 mg total) by mouth 3 (three) times daily. What changed: how much to take   insulin aspart 100 UNIT/ML FlexPen Commonly known as: NOVOLOG Inject 2 Units into the skin 3 (three) times daily with meals. Sliding scale   Insulin Degludec 100 UNIT/ML Soln Commonly known as: Tresiba Inject 4 Units into the skin at bedtime.   levofloxacin 500 MG tablet Commonly known as: Levaquin Take 1 tablet (500 mg total) by mouth daily for 4 days.   methocarbamol 500 MG tablet Commonly known as: ROBAXIN Take 500 mg by mouth 4 (four) times daily.   metroNIDAZOLE 500 MG tablet Commonly known as: Flagyl Take 1 tablet (500 mg total) by mouth 3 (three) times daily for 14 days.   mirtazapine 7.5 MG tablet Commonly known as: REMERON Take 7.5 mg by mouth at bedtime.   nitroGLYCERIN 0.4 MG SL tablet Commonly known as: NITROSTAT Dissolve 1 tablet under the tongue every 5 minutes as needed for chest pain. Max of 3 doses, then 911.   predniSONE 5 MG tablet Commonly known as: DELTASONE Take 1 tablet (5 mg total) by mouth daily with breakfast.   protein supplement shake Liqd Commonly known as: PREMIER PROTEIN Take 2 oz by  mouth 2 (two) times daily.   Restasis 0.05 % ophthalmic emulsion Generic drug: cycloSPORINE Place 1 drop into both eyes 2 (two) times daily.   Selsun Blue Dry Scalp 1 % shampoo Generic drug: pyrithione zinc Apply 1 application topically once a week.               Durable Medical Equipment  (From admission, onward)           Start     Ordered   08/12/22 0852  For home use only DME Walker rolling  Once       Comments: 5 wheel  Question Answer Comment  Walker: With 5 Inch Wheels   Patient needs a walker to treat with the following condition Weakness      08/12/22 0851             Follow-up Information     [facility] Follow up.   Why: [facility] will contact you within 48 hours of discharge to arrange initial home visit Contact information: [address]  [place]  [place]  [address]   [phone]        Maczis, Hedda Slade, [place]. Go on 08/28/2022.   Specialty: General Surgery Why: 08/28/22 at 10:25 am, Please arrive 30 minutes early to complete check in, and bring photo ID and insurance card. Contact information: [address] [place] [address] [phone]         Adrian Prince, MD. Schedule an appointment as soon as possible for a visit in 1 week(s).   Specialty: Endocrinology Contact information: [address] [place] [address] [phone]         Napoleon Form, MD. Schedule an appointment as soon as possible for a visit in 1 week(s).   Specialty: Gastroenterology Contact information: [address] [place] [address] [phone]  Major procedures and Radiology Reports - PLEASE review detailed and final reports thoroughly  -      MR ABDOMEN MRCP WO CONTRAST  Result Date: 08/10/2022 CLINICAL DATA:  No obstruction suspected. EXAM: MRI ABDOMEN WITHOUT CONTRAST  (INCLUDING MRCP) TECHNIQUE: Multiplanar multisequence MR imaging of the abdomen was performed. Heavily T2-weighted images of  the biliary and pancreatic ducts were obtained, and three-dimensional MRCP images were rendered by post processing. COMPARISON:  CT and ultrasound 08/07/2022 FINDINGS: Lower chest:  Bilateral small to moderate pleural effusions. Hepatobiliary: No intrahepatic biliary duct dilatation. The common hepatic duct measures 7 mm in the common bile duct measures 6 mm (series 5). No filling defects within the common bile duct. Postsurgical change the gallbladder fossa. No fluid collections. Cystic duct remnant noted (image 18/4) normal liver parenchyma. Pancreas: Normal pancreatic parenchymal intensity. No ductal dilatation or inflammation. Spleen: Normal spleen. Adrenals/urinary tract: High signal intensity cysts within the kidneys on T2 weighted imaging. No follow-up recommended for presumed benign renal cysts. No IV contrast. Stomach/Bowel: Stomach and limited of the small bowel is unremarkable Vascular/Lymphatic: Abdominal aortic normal caliber. No retroperitoneal periportal lymphadenopathy. Musculoskeletal: No aggressive osseous lesion IMPRESSION: 1. No evidence of biliary obstruction. Common hepatic duct and common bile duct upper limits of normal. No choledocholithiasis. 2. Postsurgical change of cholecystectomy. Expected postsurgical change in the gallbladder fossa. No fluid collections. 3. Normal pancreas. 4. Bilateral pleural effusions. Electronically Signed   By: Genevive Bi M.D.   On: 08/10/2022 11:52   DG Cholangiogram Operative  Result Date: 08/10/2022 CLINICAL DATA:  [id number] Surgery, elective [id number] EXAM: INTRAOPERATIVE CHOLANGIOGRAM COMPARISON:  US Abdomen and CT AP, 08/07/2022. FLUOROSCOPY: Exposure Index (as provided by the fluoroscopic device): 4.4 mGy Kerma FINDINGS: limited oblique planar images of the RIGHT upper quadrant obtained C-arm. Images demonstrating laparoscopic instrumentation, cystic duct cannulation and antegrade cholangiogram. Spillage of contrast into the duodenum, however mild  extrahepatic biliary dilatation with suspected small filling defects at the distal CBD about the ampulla. See key images. IMPRESSION: Fluoroscopic imaging for intraoperative cholangiogram. Question small biliary filling defects of the distal CBD about the ampulla, despite spillage of contrast into the duodenum. Findings suspicious for choledocholithiasis. If continued clinical concern consider MRCP and/or ERCP for further evaluation. Electronically Signed   By: Roanna Banning M.D.   On: 08/10/2022 07:26   US RENAL  Result Date: 08/08/2022 CLINICAL DATA:  Acute kidney injury EXAM: RENAL / URINARY TRACT ULTRASOUND COMPLETE COMPARISON:  CT abdomen pelvis with contrast 08/07/2022 FINDINGS: Right Kidney: Renal measurements: 9.7 x 4.1 x 4.4 cm = volume: 93 mL. Mild increased echogenicity the renal cortex without significant thinning. No mass or hydronephrosis visualized. 2.7 cm simple cyst in the upper pole does not require dedicated imaging follow-up. Left Kidney: Renal measurements: 9.6 x 4.9 x 4.8 cm = volume: 120 mL. Mild increased echogenicity the renal cortex without significant thinning. No mass or hydronephrosis visualized. Bladder: Appears normal for degree of bladder distention. Other: None. IMPRESSION: Mild increased echogenicity of the renal cortices without significant thinning indicative of medical renal disease. Electronically Signed   By: Acquanetta Belling M.D.   On: 08/08/2022 11:31   DG Chest Port 1 View  Result Date: 08/08/2022 CLINICAL DATA:  Shortness of breath EXAM: PORTABLE CHEST 1 VIEW COMPARISON:  Yesterday FINDINGS: Hazy density at the left base, atelectasis or scarring. Streak normal heart size and mediastinal contours. There is no edema, consolidation, effusion, or pneumothorax. Which is IMPRESSION: Mild scarring/atelectasis at the left lung base. No change from  prior. Electronically Signed   By: Tiburcio Pea M.D.   On: 08/08/2022 06:42   US Abdomen Limited RUQ (LIVER/GB)  Result Date:  08/07/2022 CLINICAL DATA:  Right upper quadrant pain. EXAM: ULTRASOUND ABDOMEN LIMITED RIGHT UPPER QUADRANT COMPARISON:  Aug 02, 2018 FINDINGS: Gallbladder: Small echogenic gallstones and/or gallbladder sludge is suspected within the region of the gallbladder fundus. There is no evidence of gallbladder wall thickening (2.5 mm) no sonographic Murphy sign noted by sonographer. Common bile duct: Diameter: 7.1 mm Liver: No focal lesion identified. Within normal limits in parenchymal echogenicity. Portal vein is patent on color Doppler imaging with normal direction of blood flow towards the liver. Other: A 2.6 cm x 1.8 cm x 2.1 cm simple cyst is seen within the upper pole of the right kidney. IMPRESSION: 1. Cholelithiasis and/or gallbladder sludge, without evidence of acute cholecystitis. 2. Simple right renal cyst. Electronically Signed   By: Aram Candela M.D.   On: 08/07/2022 21:17   CT ABDOMEN PELVIS W CONTRAST  Result Date: 08/07/2022 CLINICAL DATA:  Epigastric pain EXAM: CT ABDOMEN AND PELVIS WITH CONTRAST TECHNIQUE: Multidetector CT imaging of the abdomen and pelvis was performed using the standard protocol following bolus administration of intravenous contrast. RADIATION DOSE REDUCTION: This exam was performed according to the departmental dose-optimization program which includes automated exposure control, adjustment of the mA and/or kV according to patient size and/or use of iterative reconstruction technique. CONTRAST:  50mL OMNIPAQUE IOHEXOL 350 MG/ML SOLN COMPARISON:  01/24/2022 FINDINGS: Lower chest: Lung bases show mild left basilar atelectasis. No focal infiltrate or effusion is seen. Hepatobiliary: Liver is well visualized and within normal limits. Gallbladder is well distended with some dependent density consistent with small gallstones. Wall thickening and likely wall edema is noted. No obstructive changes are seen. Pancreas: Unremarkable. No pancreatic ductal dilatation or surrounding  inflammatory changes. Spleen: Normal in size without focal abnormality. Adrenals/Urinary Tract: Adrenal glands are within normal limits. Kidneys are well visualized bilaterally within normal enhancement pattern. No renal calculi are noted. Simple appearing right renal cyst is noted measuring 2.8 cm. This is similar to that seen on the prior exam. No further follow-up is recommended. Smaller left renal cyst is noted also stable from the prior study. No obstructive changes are seen. The bladder is well distended. Stomach/Bowel: Scattered diverticular change of the colon is noted without evidence of diverticulitis. The appendix is within normal limits. No obstructive changes of the colon are seen. Small bowel and stomach are within normal limits. Vascular/Lymphatic: Aortic atherosclerosis. No enlarged abdominal or pelvic lymph nodes. Reproductive: Uterus and bilateral adnexa are unremarkable. Other: No abdominal wall hernia or abnormality. No abdominopelvic ascites. Musculoskeletal: Mild degenerative changes of lumbar spine are noted with anterolisthesis of L4 on L5. IMPRESSION: Cholelithiasis with evidence of gallbladder wall thickening and likely wall edema. Right upper quadrant ultrasound may be helpful for further evaluation as this may represent acute cholecystitis. Diverticulosis without diverticulitis. Simple renal cysts bilaterally stable from prior exam. No further follow-up is recommended. Electronically Signed   By: Alcide Clever M.D.   On: 08/07/2022 20:33   DG Chest Portable 1 View  Result Date: 08/07/2022 CLINICAL DATA:  Epigastric pain EXAM: PORTABLE CHEST 1 VIEW COMPARISON:  Chest x-ray 01/29/2022 FINDINGS: The heart size and mediastinal contours are within normal limits. Both lungs are clear. There is blunting of the left costophrenic angle. There is no pneumothorax. The visualized skeletal structures are unremarkable. IMPRESSION: Blunting of the left costophrenic angle may be due to pleural  thickening or small pleural effusion. Electronically Signed   By: Darliss Cheney M.D.   On: 08/07/2022 18:01     Today   Subjective    Shelby Walker today has no headache,no chest abdominal pain,no new weakness tingling or numbness, feels much better wants to go home today.    Objective   Blood pressure (!) 121/51, pulse 62, temperature 98 F (36.7 C), temperature source Oral, resp. rate 16, height 5' 1.5" (1.562 m), weight 50.9 kg, SpO2 95 %.   Intake/Output Summary (Last 24 hours) at 08/12/2022 0857 Last data filed at 08/12/2022 0044 Gross per 24 hour  Intake --  Output 151 ml  Net -151 ml    Exam  Awake Alert, No new F.N deficits,    [place].AT,PERRAL Supple Neck,   Symmetrical Chest wall movement, Good air movement bilaterally, CTAB RRR,No Gallops,   +ve B.Sounds, Abd Soft, Non tender,  No Cyanosis, Clubbing or edema    Data Review   Recent Labs  Lab 08/07/22 1710 08/08/22 0622 08/09/22 0714 08/10/22 0622 08/11/22 0206 08/12/22 0415  WBC 9.6 9.3 7.8 7.3 5.9 6.6  HGB 9.2* 8.1* 8.4* 7.6* 7.9* 7.9*  HCT 30.0* 25.4* 26.1* 24.3* 24.5* 24.4*  PLT 194 159 133* 119* 104* 90*  MCV 92.3 90.7 90.9 91.7 92.5 93.5  MCH 28.3 28.9 29.3 28.7 29.8 30.3  MCHC 30.7 31.9 32.2 31.3 32.2 32.4  RDW 16.1* 16.5* 16.2* 16.5* 16.6* 16.6*  LYMPHSABS 0.6*  --  0.2* 0.2* 0.4* 0.9  MONOABS 0.2  --  0.0* 0.1 0.2 0.3  EOSABS 0.0  --  0.0 0.0 0.0 0.4  BASOSABS 0.0  --  0.2* 0.0 0.0 0.0    Recent Labs  Lab 08/07/22 1915 08/07/22 2129 08/08/22 0620 08/08/22 0622 08/09/22 0714 08/10/22 0622 08/11/22 0206 08/12/22 0415  NA  --   --   --  136 141 140 142 142  K  --   --   --  5.0 3.7 3.4* 4.1 3.2*  CL  --   --   --  102 107 109 109 112*  CO2  --   --   --  21* 21* 23 23 23   ANIONGAP  --   --   --  13 13 8 10 7   GLUCOSE  --   --   --  167* 192* 295* 206* 153*  BUN  --   --   --  64* 55* 56* 50* 39*  CREATININE  --   --   --  2.00* 1.78* 1.78* 1.58* 1.43*  AST  --   --   --  4,600* 1,286*  584* 259* 87*  ALT  --   --   --  2,504* 1,424* 896* 572* 305*  ALKPHOS  --   --   --  155* 136* 136* 121 97  BILITOT  --   --   --  2.9* 1.7* 1.0 0.7 0.7  ALBUMIN  --   --   --  2.6* 2.2* 2.2* 2.2* 1.9*  CRP  --   --   --  7.2* 10.8* 8.5* 8.1* 8.0*  PROCALCITON  --   --   --  51.50 34.93 29.92 22.41 11.89  LATICACIDVEN 3.5* 3.4*  --  1.6  --   --   --   --   HGBA1C  --   --  9.2*  --   --   --   --   --   BNP  --   --   --  365.7* 866.7* 963.7* 849.0* 991.2*  MG  --   --   --  2.0 2.1 2.3 2.3 2.3  CALCIUM  --   --   --  7.7* 7.5* 7.3* 7.2* 7.1*    Total Time in preparing paper work, data evaluation and todays exam - 35 minutes  Signature  -    Susa Raring M.D on 08/12/2022 at 8:57 AM   -  To page go to [email or web address]

## 2022-08-13 LAB — SURGICAL PATHOLOGY

## 2022-08-15 ENCOUNTER — Other Ambulatory Visit: Payer: Medicare Other

## 2022-08-15 ENCOUNTER — Telehealth: Payer: Self-pay | Admitting: Internal Medicine

## 2022-08-15 DIAGNOSIS — Z7952 Long term (current) use of systemic steroids: Secondary | ICD-10-CM | POA: Diagnosis not present

## 2022-08-15 DIAGNOSIS — L409 Psoriasis, unspecified: Secondary | ICD-10-CM | POA: Diagnosis not present

## 2022-08-15 DIAGNOSIS — I051 Rheumatic mitral insufficiency: Secondary | ICD-10-CM | POA: Diagnosis not present

## 2022-08-15 DIAGNOSIS — M069 Rheumatoid arthritis, unspecified: Secondary | ICD-10-CM | POA: Diagnosis not present

## 2022-08-15 DIAGNOSIS — E1122 Type 2 diabetes mellitus with diabetic chronic kidney disease: Secondary | ICD-10-CM | POA: Diagnosis not present

## 2022-08-15 DIAGNOSIS — I130 Hypertensive heart and chronic kidney disease with heart failure and stage 1 through stage 4 chronic kidney disease, or unspecified chronic kidney disease: Secondary | ICD-10-CM | POA: Diagnosis not present

## 2022-08-15 DIAGNOSIS — I480 Paroxysmal atrial fibrillation: Secondary | ICD-10-CM | POA: Diagnosis not present

## 2022-08-15 DIAGNOSIS — N1832 Chronic kidney disease, stage 3b: Secondary | ICD-10-CM | POA: Diagnosis not present

## 2022-08-15 DIAGNOSIS — H9193 Unspecified hearing loss, bilateral: Secondary | ICD-10-CM | POA: Diagnosis not present

## 2022-08-15 DIAGNOSIS — Z48815 Encounter for surgical aftercare following surgery on the digestive system: Secondary | ICD-10-CM | POA: Diagnosis not present

## 2022-08-15 DIAGNOSIS — Z7984 Long term (current) use of oral hypoglycemic drugs: Secondary | ICD-10-CM | POA: Diagnosis not present

## 2022-08-15 DIAGNOSIS — N281 Cyst of kidney, acquired: Secondary | ICD-10-CM | POA: Diagnosis not present

## 2022-08-15 DIAGNOSIS — E785 Hyperlipidemia, unspecified: Secondary | ICD-10-CM | POA: Diagnosis not present

## 2022-08-15 DIAGNOSIS — E875 Hyperkalemia: Secondary | ICD-10-CM | POA: Diagnosis not present

## 2022-08-15 DIAGNOSIS — Z794 Long term (current) use of insulin: Secondary | ICD-10-CM | POA: Diagnosis not present

## 2022-08-15 DIAGNOSIS — I509 Heart failure, unspecified: Secondary | ICD-10-CM | POA: Diagnosis not present

## 2022-08-15 DIAGNOSIS — M810 Age-related osteoporosis without current pathological fracture: Secondary | ICD-10-CM | POA: Diagnosis not present

## 2022-08-15 DIAGNOSIS — I890 Lymphedema, not elsewhere classified: Secondary | ICD-10-CM | POA: Diagnosis not present

## 2022-08-15 DIAGNOSIS — K219 Gastro-esophageal reflux disease without esophagitis: Secondary | ICD-10-CM | POA: Diagnosis not present

## 2022-08-15 DIAGNOSIS — I050 Rheumatic mitral stenosis: Secondary | ICD-10-CM | POA: Diagnosis not present

## 2022-08-15 DIAGNOSIS — M179 Osteoarthritis of knee, unspecified: Secondary | ICD-10-CM | POA: Diagnosis not present

## 2022-08-15 DIAGNOSIS — N179 Acute kidney failure, unspecified: Secondary | ICD-10-CM | POA: Diagnosis not present

## 2022-08-15 DIAGNOSIS — E1165 Type 2 diabetes mellitus with hyperglycemia: Secondary | ICD-10-CM | POA: Diagnosis not present

## 2022-08-15 DIAGNOSIS — E1151 Type 2 diabetes mellitus with diabetic peripheral angiopathy without gangrene: Secondary | ICD-10-CM | POA: Diagnosis not present

## 2022-08-15 DIAGNOSIS — K5730 Diverticulosis of large intestine without perforation or abscess without bleeding: Secondary | ICD-10-CM | POA: Diagnosis not present

## 2022-08-15 NOTE — Telephone Encounter (Signed)
Pt daughter called in stating pt was in the hospital recently and they started her on antibiotics that she will be finished with by the end of this week. She wants to know if Dr. Graciela Husbands is still okay with doing the loop implant next week or push it back. Please advise.

## 2022-08-16 NOTE — Telephone Encounter (Signed)
Spoke with pt's daughter, [name], [place] and advised per Dr Graciela Husbands ILR implant should be canceled and rescheduled after pt has had sufficient time to recover from hospitalization and surgery.  Will have Dr Odessa Fleming scheduler call to reschedule appointment.  Pt's daughter verbalizes understanding and agrees with current plan.

## 2022-08-20 DIAGNOSIS — M069 Rheumatoid arthritis, unspecified: Secondary | ICD-10-CM | POA: Diagnosis not present

## 2022-08-20 DIAGNOSIS — R600 Localized edema: Secondary | ICD-10-CM | POA: Diagnosis not present

## 2022-08-20 DIAGNOSIS — Z9049 Acquired absence of other specified parts of digestive tract: Secondary | ICD-10-CM | POA: Diagnosis not present

## 2022-08-20 DIAGNOSIS — E1165 Type 2 diabetes mellitus with hyperglycemia: Secondary | ICD-10-CM | POA: Diagnosis not present

## 2022-08-20 DIAGNOSIS — I130 Hypertensive heart and chronic kidney disease with heart failure and stage 1 through stage 4 chronic kidney disease, or unspecified chronic kidney disease: Secondary | ICD-10-CM | POA: Diagnosis not present

## 2022-08-20 DIAGNOSIS — I480 Paroxysmal atrial fibrillation: Secondary | ICD-10-CM | POA: Diagnosis not present

## 2022-08-20 DIAGNOSIS — E1122 Type 2 diabetes mellitus with diabetic chronic kidney disease: Secondary | ICD-10-CM | POA: Diagnosis not present

## 2022-08-20 DIAGNOSIS — I509 Heart failure, unspecified: Secondary | ICD-10-CM | POA: Diagnosis not present

## 2022-08-20 DIAGNOSIS — K8512 Biliary acute pancreatitis with infected necrosis: Secondary | ICD-10-CM | POA: Diagnosis not present

## 2022-08-20 DIAGNOSIS — E1140 Type 2 diabetes mellitus with diabetic neuropathy, unspecified: Secondary | ICD-10-CM | POA: Diagnosis not present

## 2022-08-20 DIAGNOSIS — N1832 Chronic kidney disease, stage 3b: Secondary | ICD-10-CM | POA: Diagnosis not present

## 2022-08-20 DIAGNOSIS — E612 Magnesium deficiency: Secondary | ICD-10-CM | POA: Diagnosis not present

## 2022-08-20 DIAGNOSIS — L219 Seborrheic dermatitis, unspecified: Secondary | ICD-10-CM | POA: Diagnosis not present

## 2022-08-20 DIAGNOSIS — Z48815 Encounter for surgical aftercare following surgery on the digestive system: Secondary | ICD-10-CM | POA: Diagnosis not present

## 2022-08-21 DIAGNOSIS — E1165 Type 2 diabetes mellitus with hyperglycemia: Secondary | ICD-10-CM | POA: Diagnosis not present

## 2022-08-21 DIAGNOSIS — Z48815 Encounter for surgical aftercare following surgery on the digestive system: Secondary | ICD-10-CM | POA: Diagnosis not present

## 2022-08-21 DIAGNOSIS — I130 Hypertensive heart and chronic kidney disease with heart failure and stage 1 through stage 4 chronic kidney disease, or unspecified chronic kidney disease: Secondary | ICD-10-CM | POA: Diagnosis not present

## 2022-08-21 DIAGNOSIS — E1122 Type 2 diabetes mellitus with diabetic chronic kidney disease: Secondary | ICD-10-CM | POA: Diagnosis not present

## 2022-08-21 DIAGNOSIS — N1832 Chronic kidney disease, stage 3b: Secondary | ICD-10-CM | POA: Diagnosis not present

## 2022-08-21 DIAGNOSIS — I509 Heart failure, unspecified: Secondary | ICD-10-CM | POA: Diagnosis not present

## 2022-08-22 ENCOUNTER — Ambulatory Visit: Payer: Medicare Other | Admitting: Internal Medicine

## 2022-08-22 ENCOUNTER — Other Ambulatory Visit: Payer: Medicare Other

## 2022-08-28 DIAGNOSIS — E1165 Type 2 diabetes mellitus with hyperglycemia: Secondary | ICD-10-CM | POA: Diagnosis not present

## 2022-08-28 DIAGNOSIS — E1122 Type 2 diabetes mellitus with diabetic chronic kidney disease: Secondary | ICD-10-CM | POA: Diagnosis not present

## 2022-08-28 DIAGNOSIS — N1832 Chronic kidney disease, stage 3b: Secondary | ICD-10-CM | POA: Diagnosis not present

## 2022-08-28 DIAGNOSIS — I130 Hypertensive heart and chronic kidney disease with heart failure and stage 1 through stage 4 chronic kidney disease, or unspecified chronic kidney disease: Secondary | ICD-10-CM | POA: Diagnosis not present

## 2022-08-28 DIAGNOSIS — I509 Heart failure, unspecified: Secondary | ICD-10-CM | POA: Diagnosis not present

## 2022-08-28 DIAGNOSIS — Z48815 Encounter for surgical aftercare following surgery on the digestive system: Secondary | ICD-10-CM | POA: Diagnosis not present

## 2022-09-03 ENCOUNTER — Other Ambulatory Visit: Payer: Medicare Other

## 2022-09-03 DIAGNOSIS — E1122 Type 2 diabetes mellitus with diabetic chronic kidney disease: Secondary | ICD-10-CM | POA: Diagnosis not present

## 2022-09-03 DIAGNOSIS — N1832 Chronic kidney disease, stage 3b: Secondary | ICD-10-CM | POA: Diagnosis not present

## 2022-09-03 DIAGNOSIS — Z48815 Encounter for surgical aftercare following surgery on the digestive system: Secondary | ICD-10-CM | POA: Diagnosis not present

## 2022-09-03 DIAGNOSIS — I130 Hypertensive heart and chronic kidney disease with heart failure and stage 1 through stage 4 chronic kidney disease, or unspecified chronic kidney disease: Secondary | ICD-10-CM | POA: Diagnosis not present

## 2022-09-03 DIAGNOSIS — E1165 Type 2 diabetes mellitus with hyperglycemia: Secondary | ICD-10-CM | POA: Diagnosis not present

## 2022-09-03 DIAGNOSIS — I509 Heart failure, unspecified: Secondary | ICD-10-CM | POA: Diagnosis not present

## 2022-09-03 NOTE — Progress Notes (Signed)
LACHLAN, BROLL ([id number]) [id number] Page 1 of 6 Visit Report for 07/16/2022 Chief Complaint Document Details Patient Name: Date of Service: Shelby Walker 07/16/2022 12:30 PM Medical Record Number: [id number] Patient Account Number: [id number] Date of Birth/Sex: Treating RN: 07-24-1933 (89 y.o. F) Primary Care Provider: Adrian Prince Other Clinician: Referring Provider: Treating Provider/Extender: Ignatius Specking in Treatment: 5 Information Obtained from: Patient Chief Complaint 06/01/2022; previous history of sacral ulcer and bilateral feet wounds. Electronic Signature(s) Signed: 07/16/2022 2:08:31 PM By: Geralyn Corwin DO Entered By: Geralyn Corwin on 07/16/2022 13:00:00 -------------------------------------------------------------------------------- HPI Details Patient Name: Date of Service: Shelby Walker 07/16/2022 12:30 PM Medical Record Number: [id number] Patient Account Number: [id number] Date of Birth/Sex: Treating RN: 10/17/1933 (89 y.o. F) Primary Care Provider: Adrian Prince Other Clinician: Referring Provider: Treating Provider/Extender: Ignatius Specking in Treatment: 5 History of Present Illness HPI Description: 06/01/2022 Shelby Walker is an 89 year old female with a past medical history of type 2 diabetes with previous transmetatarsal of the right foot, rheumatoid arthritis that presents to the clinic for concern of wounds to her sacrum and bilateral feet. She was referred by infectious disease and orthopedic surgery for these issues. Daughter is present and helps provide the history. She states that 6 weeks ago she developed pressure injuries and superficial wounds to her feet. She was treated with a long course of antibiotics Due to concern for infection. She has been using Silvadene cream to the areas. She is also been using bunny boots. Over the past several weeks the wounds have healed. She  also developed a sore to her sacrum 2 to 3 weeks ago. She has been keeping the area clean. This area is also healed. She currently denies signs of infection. 4/8; patient was last seen 1 week ago as a consult for her previous left foot wound that was healed. She had a stage I pressure ulcer to her sacrum at that time. Unfortunately this has progressed. She sits for long periods of time in her recliner and couch throughout the day. She is able to ambulate with a walker however it does not appear to be doing very much outside of going to the bathroom and going to the kitchen to eat. She currently denies signs of infection. 4/15; patient presents for follow-up. She has been using Medihoney and Hydrofera Blue to the sacral wound. She has no issues or complaints today. She has tolerated this well. The wound is smaller. 4/29; patient presents for follow-up. She has been using Medihoney and Hydrofera Blue to the sacral wound. It is almost healed. She reports being diligent about her offloading. 5/13; patient presents for follow-up. She has been using Medihoney and Hydrofera Blue to the sacral wound. It is healed today. Electronic Signature(s) Signed: 07/16/2022 2:08:31 PM By: Geralyn Corwin DO Entered By: Geralyn Corwin on 07/16/2022 13:00:19 Shelby Walker ([id number]) [id number] Page 2 of 6 -------------------------------------------------------------------------------- Physical Exam Details Patient Name: Date of Service: Shelby Walker, Shelby Walker 07/16/2022 12:30 PM Medical Record Number: [id number] Patient Account Number: [id number] Date of Birth/Sex: Treating RN: [birth year] (89 y.o. F) Primary Care Provider: Adrian Prince Other Clinician: Referring Provider: Treating Provider/Extender: Ignatius Specking in Treatment: 5 Constitutional respirations regular, non-labored and within target range for patient.Marland Kitchen Psychiatric pleasant and cooperative. Notes T  the sacrum there is epithelization to the previous wound site o Electronic Signature(s) Signed: 07/16/2022 2:08:31 PM By: Geralyn Corwin DO Entered By: Geralyn Corwin on 07/16/2022 13:01:08 -------------------------------------------------------------------------------- Physician Orders  Details Patient Name: Date of Service: Shelby Walker, Shelby Walker 07/16/2022 12:30 PM Medical Record Number: [id number] Patient Account Number: [id number] Date of Birth/Sex: Treating RN: 06-10-1933 (89 y.o. Arta Silence Primary Care Provider: Adrian Prince Other Clinician: Referring Provider: Treating Provider/Extender: Ignatius Specking in Treatment: 5 Verbal / Phone Orders: No Diagnosis Coding ICD-10 Coding Code Description L89.153 Pressure ulcer of sacral region, stage 3 E11.622 Type 2 diabetes mellitus with other skin ulcer M06.9 Rheumatoid arthritis, unspecified Discharge From [facility] Services Discharge from Wound Care Center - Call if any future wound care needs. Off-Loading Turn and reposition every 2 hours Other: - continue to walk. Electronic Signature(s) Signed: 07/16/2022 2:08:31 PM By: Geralyn Corwin DO Entered By: Geralyn Corwin on 07/16/2022 13:01:15 Shelby Walker ([id number]) [id number] Page 3 of 6 -------------------------------------------------------------------------------- Problem List Details Patient Name: Date of Service: Shelby Walker, Shelby Walker 07/16/2022 12:30 PM Medical Record Number: [id number] Patient Account Number: [id number] Date of Birth/Sex: Treating RN: 1933-10-23 (89 y.o. Arta Silence Primary Care Provider: Adrian Prince Other Clinician: Referring Provider: Treating Provider/Extender: Ignatius Specking in Treatment: 5 Active Problems ICD-10 Encounter Code Description Active Date MDM Diagnosis L89.153 Pressure ulcer of sacral region, stage 3 06/11/2022 No Yes E11.622 Type 2 diabetes mellitus with other  skin ulcer 06/11/2022 No Yes M06.9 Rheumatoid arthritis, unspecified 06/11/2022 No Yes Inactive Problems Resolved Problems Electronic Signature(s) Signed: 07/16/2022 2:08:31 PM By: Geralyn Corwin DO Entered By: Geralyn Corwin on 07/16/2022 12:59:47 -------------------------------------------------------------------------------- Progress Note Details Patient Name: Date of Service: Shelby Walker. 07/16/2022 12:30 PM Medical Record Number: [id number] Patient Account Number: [id number] Date of Birth/Sex: Treating RN: 1933-09-15 (89 y.o. F) Primary Care Provider: Adrian Prince Other Clinician: Referring Provider: Treating Provider/Extender: Ignatius Specking in Treatment: 5 Subjective Chief Complaint Information obtained from Patient 06/01/2022; previous history of sacral ulcer and bilateral feet wounds. History of Present Illness (HPI) 06/01/2022 [name] is an 89 year old female with a past medical history of type 2 diabetes with previous transmetatarsal of the right foot, rheumatoid arthritis that presents to the clinic for concern of wounds to her sacrum and bilateral feet. She was referred by infectious disease and orthopedic surgery for these issues. Daughter is present and helps provide the history. She states that 6 weeks ago she developed pressure injuries and superficial wounds to her feet. She was treated with a long course of antibiotics Due to concern for infection. She has been using Silvadene cream to the areas. She is also been using bunny boots. Over the past several weeks the wounds have healed. She also developed a sore to her sacrum 2 to 3 weeks ago. She has been keeping the area clean. This area is also healed. She currently denies signs of infection. 4/8; patient was last seen 1 week ago as a consult for her previous left foot wound that was healed. She had a stage I pressure ulcer to her sacrum at that time. Unfortunately this has progressed. She  sits for long periods of time in her recliner and couch throughout the day. She is able to ambulate with a walker however it does not appear to be doing very much outside of going to the bathroom and going to the kitchen to eat. She currently denies signs of infection. 4/15; patient presents for follow-up. She has been using Medihoney and Hydrofera Blue to the sacral wound. She has no issues or complaints today. She has tolerated this well. The wound is smaller. 4/29; patient  presents for follow-up. She has been using Medihoney and Hydrofera Blue to the sacral wound. It is almost healed. She reports being diligent about her offloading. Shelby Walker, Shelby Walker ([id number]) [id number] Page 4 of 6 5/13; patient presents for follow-up. She has been using Medihoney and Hydrofera Blue to the sacral wound. It is healed today. Patient History Information obtained from Patient, Chart. Family History Unknown History. Social History Never smoker, Marital Status - Widowed, Alcohol Use - Never, Drug Use - No History, Caffeine Use - Rarely. Medical History Endocrine Patient has history of Type II Diabetes Musculoskeletal Patient has history of Osteoarthritis Hospitalization/Surgery History - Ppm generator removal. - tee without conversion. - pacemaker implaint. - loop recorder removal. - transmet. amp. right. - abdominal aortogram w/ lower extremity right. - fracture surgery. Medical A Surgical History Notes nd Cardiovascular mitral regurg., bruit, chest pain,unspecified, dyslipidemia, ejection fraction 55-60 % , palpitations Objective Constitutional respirations regular, non-labored and within target range for patient.. Vitals Time Taken: 12:33 PM, Height: 61 in, Weight: 110 lbs, BMI: 20.8, Temperature: 98.6 F, Pulse: 84 bpm, Respiratory Rate: 16 breaths/min, Blood Pressure: 136/76 mmHg. Psychiatric pleasant and cooperative. General Notes: T the sacrum there is epithelization to  the previous wound site o Integumentary (Hair, Skin) Wound #1 status is Open. Original cause of wound was Gradually Appeared. The date acquired was: 06/07/2022. The wound has been in treatment 5 weeks. The wound is located on the Left Sacrum. The wound measures 0cm length x 0cm width x 0cm depth; 0cm^2 area and 0cm^3 volume. There is no tunneling or undermining noted. There is a none present amount of drainage noted. The wound margin is distinct with the outline attached to the wound base. There is no granulation within the wound bed. There is no necrotic tissue within the wound bed. The periwound skin appearance had no abnormalities noted for moisture. The periwound skin appearance did not exhibit: Callus, Crepitus, Excoriation, Induration, Rash, Scarring, Atrophie Blanche, Cyanosis, Ecchymosis, Hemosiderin Staining, Mottled, Pallor, Rubor, Erythema. Periwound temperature was noted as No Abnormality. Assessment Active Problems ICD-10 Pressure ulcer of sacral region, stage 3 Type 2 diabetes mellitus with other skin ulcer Rheumatoid arthritis, unspecified Patient has done well with Hydrofera Blue and Medihoney. Her wound is healed. I recommended she continue to offload the area over the next 1 to 2 weeks to assure further healing. Follow-up as needed. Plan Discharge From [facility] Services: Discharge from Wound Care Center - Call if any future wound care needs. Off-Loading: Turn and reposition every 2 hours Other: - continue to walk. Shelby Walker, Shelby Walker ([id number]) [id number] Page 5 of 6 1. Follow-up as needed 2. Discharge from Center due to closed wound Electronic Signature(s) Signed: 09/03/2022 2:10:53 PM By: Pearletha Alfred Signed: 09/03/2022 2:57:08 PM By: Geralyn Corwin DO Previous Signature: 07/16/2022 2:08:31 PM Version By: Geralyn Corwin DO Entered By: Pearletha Alfred on 09/03/2022  14:10:53 -------------------------------------------------------------------------------- HxROS Details Patient Name: Date of Service: Shelby Walker, Shelby Walker 07/16/2022 12:30 PM Medical Record Number: [id number] Patient Account Number: [id number] Date of Birth/Sex: Treating RN: October 06, 1933 (89 y.o. F) Primary Care Provider: Adrian Prince Other Clinician: Referring Provider: Treating Provider/Extender: Ignatius Specking in Treatment: 5 Information Obtained From Patient Chart Cardiovascular Medical History: Past Medical History Notes: mitral regurg., bruit, chest pain,unspecified, dyslipidemia, ejection fraction 55-60 % , palpitations Endocrine Medical History: Positive for: Type II Diabetes Musculoskeletal Medical History: Positive for: Osteoarthritis Immunizations Pneumococcal Vaccine: Received Pneumococcal Vaccination: No Implantable Devices None Hospitalization / Surgery History Type of Hospitalization/Surgery Ppm generator  removal tee without conversion pacemaker implaint loop recorder removal transmet. amp. right abdominal aortogram w/ lower extremity right fracture surgery Family and Social History Unknown History: Yes; Never smoker; Marital Status - Widowed; Alcohol Use: Never; Drug Use: No History; Caffeine Use: Rarely; Financial Concerns: No; Food, Clothing or Shelter Needs: No; Support System Lacking: No; Transportation Concerns: No Electronic Signature(s) Signed: 07/16/2022 2:08:31 PM By: Geralyn Corwin DO Entered By: Geralyn Corwin on 07/16/2022 13:00:45 Shelby Walker ([id number]) [id number] Page 6 of 6 -------------------------------------------------------------------------------- SuperBill Details Patient Name: Date of Service: Shelby Walker, Shelby Walker 07/16/2022 Medical Record Number: [id number] Patient Account Number: [id number] Date of Birth/Sex: Treating RN: 1934/02/28 (89 y.o. Arta Silence Primary Care Provider:  Adrian Prince Other Clinician: Referring Provider: Treating Provider/Extender: Ignatius Specking in Treatment: 5 Diagnosis Coding ICD-10 Codes Code Description [phone] Pressure ulcer of sacral region, stage 3 E11.622 Type 2 diabetes mellitus with other skin ulcer M06.9 Rheumatoid arthritis, unspecified Facility Procedures : CPT4 Code: [id number] Description: 99213 - WOUND CARE VISIT-LEV 3 EST PT Modifier: Quantity: 1 Physician Procedures : CPT4 Code Description Modifier [id number] 99213 - WC PHYS LEVEL 3 - EST PT ICD-10 Diagnosis Description L89.153 Pressure ulcer of sacral region, stage 3 E11.622 Type 2 diabetes mellitus with other skin ulcer M06.9 Rheumatoid arthritis, unspecified Quantity: 1 Electronic Signature(s) Signed: 07/16/2022 2:08:31 PM By: Geralyn Corwin DO Entered By: Geralyn Corwin on 07/16/2022 13:02:09

## 2022-09-04 DIAGNOSIS — E1165 Type 2 diabetes mellitus with hyperglycemia: Secondary | ICD-10-CM | POA: Diagnosis not present

## 2022-09-04 DIAGNOSIS — I130 Hypertensive heart and chronic kidney disease with heart failure and stage 1 through stage 4 chronic kidney disease, or unspecified chronic kidney disease: Secondary | ICD-10-CM | POA: Diagnosis not present

## 2022-09-04 DIAGNOSIS — E1122 Type 2 diabetes mellitus with diabetic chronic kidney disease: Secondary | ICD-10-CM | POA: Diagnosis not present

## 2022-09-04 DIAGNOSIS — Z48815 Encounter for surgical aftercare following surgery on the digestive system: Secondary | ICD-10-CM | POA: Diagnosis not present

## 2022-09-04 DIAGNOSIS — I509 Heart failure, unspecified: Secondary | ICD-10-CM | POA: Diagnosis not present

## 2022-09-04 DIAGNOSIS — N1832 Chronic kidney disease, stage 3b: Secondary | ICD-10-CM | POA: Diagnosis not present

## 2022-09-05 ENCOUNTER — Ambulatory Visit (INDEPENDENT_AMBULATORY_CARE_PROVIDER_SITE_OTHER): Payer: Medicare Other | Admitting: Podiatry

## 2022-09-05 DIAGNOSIS — I739 Peripheral vascular disease, unspecified: Secondary | ICD-10-CM | POA: Diagnosis not present

## 2022-09-05 DIAGNOSIS — K8512 Biliary acute pancreatitis with infected necrosis: Secondary | ICD-10-CM | POA: Diagnosis not present

## 2022-09-05 DIAGNOSIS — Z794 Long term (current) use of insulin: Secondary | ICD-10-CM | POA: Diagnosis not present

## 2022-09-05 DIAGNOSIS — R600 Localized edema: Secondary | ICD-10-CM

## 2022-09-05 DIAGNOSIS — L84 Corns and callosities: Secondary | ICD-10-CM

## 2022-09-05 DIAGNOSIS — E08621 Diabetes mellitus due to underlying condition with foot ulcer: Secondary | ICD-10-CM

## 2022-09-05 DIAGNOSIS — L97512 Non-pressure chronic ulcer of other part of right foot with fat layer exposed: Secondary | ICD-10-CM

## 2022-09-05 DIAGNOSIS — N1832 Chronic kidney disease, stage 3b: Secondary | ICD-10-CM | POA: Diagnosis not present

## 2022-09-05 DIAGNOSIS — M069 Rheumatoid arthritis, unspecified: Secondary | ICD-10-CM | POA: Diagnosis not present

## 2022-09-05 DIAGNOSIS — I700 Atherosclerosis of aorta: Secondary | ICD-10-CM | POA: Diagnosis not present

## 2022-09-05 DIAGNOSIS — S90422A Blister (nonthermal), left great toe, initial encounter: Secondary | ICD-10-CM

## 2022-09-05 DIAGNOSIS — Z89431 Acquired absence of right foot: Secondary | ICD-10-CM | POA: Diagnosis not present

## 2022-09-05 DIAGNOSIS — I480 Paroxysmal atrial fibrillation: Secondary | ICD-10-CM | POA: Diagnosis not present

## 2022-09-05 DIAGNOSIS — E1140 Type 2 diabetes mellitus with diabetic neuropathy, unspecified: Secondary | ICD-10-CM | POA: Diagnosis not present

## 2022-09-05 DIAGNOSIS — D649 Anemia, unspecified: Secondary | ICD-10-CM | POA: Diagnosis not present

## 2022-09-05 DIAGNOSIS — E785 Hyperlipidemia, unspecified: Secondary | ICD-10-CM | POA: Diagnosis not present

## 2022-09-05 DIAGNOSIS — I129 Hypertensive chronic kidney disease with stage 1 through stage 4 chronic kidney disease, or unspecified chronic kidney disease: Secondary | ICD-10-CM | POA: Diagnosis not present

## 2022-09-05 NOTE — Progress Notes (Signed)
Patient presents today to pick up diabetic shoes and insoles.  Patient was dispensed 1 pair of diabetic shoes and 3 pairs of foam casted diabetic insoles. Fit was satisfactory.  Had to grind down orthotic toe filler was too large.   Order pt a size 7 to try , she took the 7.5 home with her she said they fit good but daughter thought they were kind of loose.   Instructions for break-in and wear was reviewed and a copy was given to the patient.   Re-appointment for regularly scheduled diabetic foot care visits or if they should experience any trouble with the shoes or insoles.

## 2022-09-09 DIAGNOSIS — N1832 Chronic kidney disease, stage 3b: Secondary | ICD-10-CM | POA: Diagnosis not present

## 2022-09-09 DIAGNOSIS — I509 Heart failure, unspecified: Secondary | ICD-10-CM | POA: Diagnosis not present

## 2022-09-09 DIAGNOSIS — E1165 Type 2 diabetes mellitus with hyperglycemia: Secondary | ICD-10-CM | POA: Diagnosis not present

## 2022-09-09 DIAGNOSIS — E1122 Type 2 diabetes mellitus with diabetic chronic kidney disease: Secondary | ICD-10-CM | POA: Diagnosis not present

## 2022-09-09 DIAGNOSIS — I130 Hypertensive heart and chronic kidney disease with heart failure and stage 1 through stage 4 chronic kidney disease, or unspecified chronic kidney disease: Secondary | ICD-10-CM | POA: Diagnosis not present

## 2022-09-09 DIAGNOSIS — Z48815 Encounter for surgical aftercare following surgery on the digestive system: Secondary | ICD-10-CM | POA: Diagnosis not present

## 2022-09-10 ENCOUNTER — Ambulatory Visit: Payer: Medicare Other | Attending: Internal Medicine | Admitting: Internal Medicine

## 2022-09-10 VITALS — BP 154/82 | HR 74 | Ht 61.5 in | Wt 118.2 lb

## 2022-09-10 DIAGNOSIS — I10 Essential (primary) hypertension: Secondary | ICD-10-CM | POA: Diagnosis not present

## 2022-09-10 DIAGNOSIS — Z79899 Other long term (current) drug therapy: Secondary | ICD-10-CM | POA: Insufficient documentation

## 2022-09-10 DIAGNOSIS — I4819 Other persistent atrial fibrillation: Secondary | ICD-10-CM | POA: Diagnosis not present

## 2022-09-10 LAB — COMPREHENSIVE METABOLIC PANEL
Alkaline Phosphatase: 79 IU/L (ref 44–121)
Calcium: 8.7 mg/dL (ref 8.7–10.3)
Chloride: 106 mmol/L (ref 96–106)
Potassium: 4 mmol/L (ref 3.5–5.2)
Sodium: 146 mmol/L — ABNORMAL HIGH (ref 134–144)
Total Protein: 6 g/dL (ref 6.0–8.5)
eGFR: 40 mL/min/{1.73_m2} — ABNORMAL LOW (ref >59)

## 2022-09-10 LAB — CBC

## 2022-09-10 MED ORDER — AMIODARONE HCL 200 MG PO TABS: 100.0000 mg | ORAL_TABLET | Freq: Every day | ORAL | 11 refills | Status: DC

## 2022-09-10 NOTE — Progress Notes (Signed)
Patient Care Team: Adrian Prince, MD as PCP - General (Endocrinology) Nahser, Deloris Ping, MD as PCP - Cardiology (Cardiology) Duke Salvia, MD as PCP - Electrophysiology (Clinical Cardiac Electrophysiology)   HPI  Shelby Walker is a 89 y.o. female Seen following loop recorder insertion to monitor for ongoing afib following an episode following C diff colitis >>>had a 25-second sinus pause and underwent by Dr. Leonia Reeves 11/23 explantation of the loop recorder implantation of a pacemaker DOI 11/23.  Chronic steroid secondary to rheumatoid arthritis  anticoagulation stopped secondary to fall risk  Just weeks later she presented with sepsis MSSA bacteremia and explantation of the previously implanted device.  Imaging also suggested periostitis of the lumbar spine without osteomyelitis.  She was treated with antibiotics as well as vancomycin prophylaxis for C. difficile colitis  Decision to reimplant the pacemaker was deferred as despite the pause, she had no syncope and she did not want a pacemaker implanted.  There was a discussion regarding loop recorder insertion. *  Admitted 6/24 with gallstone pancreatitis with  ascending cholangitis and Enterobacter sepsis and underwent laparoscopic cholecystectomy  Minimal shortness of breath.  Gets around with a walker.  No edema or chest pain  No lightheadedness.  No falls no tachy palpitations  Tolerating the amiodarone       DATE TEST EF    10/17 Myoview  77 %    5/20 Echo  60-65 %    8/21 Echo 60-65%     11/23 Echo  55%        Date Cr K Hgb TSH LFTs  12/22 1.25 4.2 8.3    2/24    18    6/24 1.43 3.2 7.9 87             Records and Results Reviewed   Past Medical History:  Diagnosis Date   Bruit    Carotid Doppler showed no significant abnormality     9per patient)   C. difficile colitis 07/2018   with severe sepsis   Chest pain, unspecified    Nuclear, May, 2008, no scar or ischemia   Diabetes mellitus    Diverticulosis     Dyslipidemia    Ejection fraction    EF 55-60%, echo, February, 2011 // Echocardiogram 8/21: EF 60-65, no RWMA, Gr 1 DD, GLS -14%, normal RVSF, mild LAE, trivial MR, mild MS (mean 4 mmHg), RVSP 23.4    GERD (gastroesophageal reflux disease)    Mitral regurgitation 04/21/2009   mild,  echo, February, 2011   Osteoporosis    Palpitations    possible very brief atrial fibrillation on monitor and possible reentrant tachycardia   PONV (postoperative nausea and vomiting)    Psoriasis    Rheumatoid arthritis (HCC)     Past Surgical History:  Procedure Laterality Date   ABDOMINAL AORTOGRAM W/LOWER EXTREMITY Right 10/19/2020   Procedure: ABDOMINAL AORTOGRAM W/LOWER EXTREMITY;  Surgeon: Iran Ouch, MD;  Location: [facility] INVASIVE CV LAB;  Service: Cardiovascular;  Laterality: Right;   CHOLECYSTECTOMY N/A 08/09/2022   Procedure: LAPAROSCOPIC CHOLECYSTECTOMY WITH INTRAOPERATIVE CHOLANGIOGRAM;  Surgeon: Quentin Ore, MD;  Location: [facility] OR;  Service: General;  Laterality: N/A;   FRACTURE SURGERY     LOOP RECORDER REMOVAL N/A 01/05/2022   Procedure: LOOP RECORDER REMOVAL;  Surgeon: Marinus Maw, MD;  Location: [facility] INVASIVE CV LAB;  Service: Cardiovascular;  Laterality: N/A;   PACEMAKER IMPLANT N/A 01/05/2022   Procedure: PACEMAKER IMPLANT;  Surgeon: Marinus Maw,  MD;  Location: [facility] INVASIVE CV LAB;  Service: Cardiovascular;  Laterality: N/A;   PPM GENERATOR REMOVAL N/A 01/29/2022   Procedure: PPM GENERATOR REMOVAL;  Surgeon: Marinus Maw, MD;  Location: [facility]VAS[facility] CV [facility];  Service: Cardiovascular;  Laterality: N/A;   TEE WITHOUT CARDIOVERSION N/A 01/29/2022   Procedure: TRANSESOPHAGEAL ECHOCARDIOGRAM (TEE);  Surgeon: Sande Rives, MD;  Location: [facility] ENDOSCOPY;  Service: Cardiovascular;  Laterality: N/A;   TRANSMETATARSAL AMPUTATION Right 01/18/2021   Procedure: TRANSMETATARSAL AMPUTATION;  Surgeon: Toni Arthurs, MD;  Location: [facility] OR;  Service: Orthopedics;  Laterality: Right;     Current Meds  Medication Sig   acetaminophen (TYLENOL) 500 MG tablet Take 1 tablet (500 mg total) by mouth every 8 (eight) hours as needed.   amiodarone (PACERONE) 200 MG tablet Take 0.5 tablets (100 mg total) by mouth daily.   atorvastatin (LIPITOR) 20 MG tablet Take 1 tablet (20 mg total) by mouth daily.   bismuth subsalicylate (PEPTO BISMOL) 262 MG/15ML suspension Take 30 mLs by mouth daily at 6 (six) AM.   Calcium Carbonate-Vitamin D 600-400 MG-UNIT tablet Take 1 tablet by mouth daily at 12 noon.    diclofenac Sodium (VOLTAREN) 1 % GEL Apply 4 g topically 4 (four) times daily.   folic acid (FOLVITE) 1 MG tablet Take 1 mg by mouth once a week.   gabapentin (NEURONTIN) 100 MG capsule Take 1 capsule (100 mg total) by mouth 3 (three) times daily. (Patient taking differently: Take 300 mg by mouth. 2 times per day)   insulin aspart (NOVOLOG) 100 UNIT/ML FlexPen Inject 2 Units into the skin 3 (three) times daily with meals. Sliding scale   Insulin Degludec (TRESIBA) 100 UNIT/ML SOLN Inject 4 Units into the skin at bedtime.   methocarbamol (ROBAXIN) 500 MG tablet Take 500 mg by mouth 4 (four) times daily.   methotrexate 50 MG/2ML injection Inject 0.4 mLs into the muscle once a week.   mirtazapine (REMERON) 7.5 MG tablet Take 7.5 mg by mouth at bedtime.   Multiple Vitamins-Minerals (CENTRUM SILVER 50+WOMEN PO) Take 1 tablet by mouth daily.   nitroGLYCERIN (NITROSTAT) 0.4 MG SL tablet Dissolve 1 tablet under the tongue every 5 minutes as needed for chest pain. Max of 3 doses, then 911.   predniSONE (DELTASONE) 5 MG tablet Take 1 tablet (5 mg total) by mouth daily with breakfast.   protein supplement shake (PREMIER PROTEIN) LIQD Take 2 oz by mouth 2 (two) times daily.   pyrithione zinc (SELSUN BLUE DRY SCALP) 1 % shampoo Apply 1 application topically once a week.   RESTASIS 0.05 % ophthalmic emulsion Place 1 drop into both eyes 2 (two) times daily.    Allergies  Allergen Reactions    Cholestyramine     Possible- Makes throat burn. Patient said she can't take it    Compazine [Prochlorperazine Edisylate] Swelling    REACTION: " tongue swell and unable to swallow"   Celecoxib Diarrhea   Sulfonamide Derivatives Other (See Comments)    REACTION: " broke out with fine itching bumps"   Hydrocodone Other (See Comments)   Infliximab Other (See Comments)    Other reaction(s): rash   Leflunomide Other (See Comments)    Other reaction(s): diarrhea   Prochlorperazine Other (See Comments)    Other reaction(s): Unknown  Other Reaction(s): tongue swells   Rosuvastatin     Other reaction(s): muscle aches   Sulfa Antibiotics Other (See Comments)    Other reaction(s): tongue swelling      Review of  Systems negative except from HPI and PMH  Physical Exam BP (!) 154/70   Pulse 74   Ht 5' 1.5" (1.562 m)   Wt 118 lb 3.2 oz (53.6 kg)   SpO2 95%   BMI 21.97 kg/m  Well developed and nourished in no acute distress HENT normal Neck supple with JVP-  flat   Clear Regular rate and rhythm, no murmurs or gallops Abd-soft with active BS No Clubbing cyanosis edema Skin-warm and dry A & Oriented  Grossly normal sensory and motor function  ECG sinus at 75 Interval 18/09/42   Assessment and  Plan Atrial fibrillation-persistent   Fall    Peripheral vascular disease with prior revascularization  Sinus pause 25 seconds  Atrial tachycardia noted in the hospital  Recorder insertion and removal, pacemaker implantation and removal secondary to MSSA bacteremia  Hypertension     Continues on amiodarone for her history of atrial fibrillation.  Not on anticoagulation because of falls.  Lengthy discussion regarding what to do about her sinus pause.  She remains reluctant to think about transvenous pacing but is open to the idea of leadless pacing; it is hard to imagine her 25-second pause not associated with syncope hence, we have elected not to proceed immediately with pacing but  we will proceed with loop recorder insertion.  Blood pressure remains elevated.  However, with her history of falls and the fact that her daughter checks it daily at home in the 1 teens, we will not make any medication adjustments at that time  Pre op Dx sinus node dysfunction, atrial fibrillation Post op Dx Same  Procedure  Loop Recorder implantation  DESCRIPTION OF PROCEDURE:  Informed written consent was obtained.   Time Out Completed with RN   After routine prep and drape of the left parasternal area, a small incision was created. A [employer] LINQ Reveal Loop Recorder  Serial Number  [id number] was inserted.    SteriStrip dressing was  applied.  The patient tolerated the procedure without apparent complication.  EBL < 10cc

## 2022-09-10 NOTE — Patient Instructions (Addendum)
Medication Instructions:  Your physician recommends that you continue on your current medications as directed. Please refer to the Current Medication list given to you today.  Labwork: Today:  CMET, CBC & TSH  Testing/Procedures: None ordered.  Follow-Up:  Your physician wants you to follow-up in 6 months with Dr. Graciela Husbands: .  You will receive a reminder letter in the mail two months in advance. If you don't receive a letter, please call our office to schedule the follow-up appointment.    Implantable Loop Recorder Placement, Care After This sheet gives you information about how to care for yourself after your procedure. Your health care provider may also give you more specific instructions. If you have problems or questions, contact your health care provider. What can I expect after the procedure? After the procedure, it is common to have: Soreness or discomfort near the incision. Some swelling or bruising near the incision.  Follow these instructions at home: Incision care  Monitor your cardiac device site for redness, swelling, and drainage. Call the device clinic at [phone] if you experience these symptoms or fever/chills.  Keep the large square bandage on your site for 24 hours and then you may remove it yourself. Keep the steri-strips underneath in place.   You may shower after 72 hours / 3 days from your procedure with the steri-strips in place. They will usually fall off on their own, or may be removed after 10 days. Pat dry.   Avoid lotions, ointments, or perfumes over your incision until it is well-healed.  Please do not submerge in water until your site is completely healed.   Your device is MRI compatible.   Remote monitoring is used to monitor your cardiac device from home. This monitoring is scheduled every month by our office. It allows [place] to keep an eye on the function of your device to ensure it is working properly.  If your wound site starts to bleed apply  pressure.    For help with the monitor please call [employer] Monitor Support Specialist directly at [phone].    If you have any questions/concerns please call the device clinic at [phone].  Activity  Return to your normal activities.  General instructions Follow instructions from your health care provider about how to manage your implantable loop recorder and transmit the information. Learn how to activate a recording if this is necessary for your type of device. You may go through a metal detection gate, and you may let someone hold a metal detector over your chest. Show your ID card if needed. Do not have an MRI unless you check with your health care provider first. Take over-the-counter and prescription medicines only as told by your health care provider. Keep all follow-up visits as told by your health care provider. This is important. Contact a health care provider if: You have redness, swelling, or pain around your incision. You have a fever. You have pain that is not relieved by your pain medicine. You have triggered your device because of fainting (syncope) or because of a heartbeat that feels like it is racing, slow, fluttering, or skipping (palpitations). Get help right away if you have: Chest pain. Difficulty breathing. Summary After the procedure, it is common to have soreness or discomfort near the incision. Change your dressing as told by your health care provider. Follow instructions from your health care provider about how to manage your implantable loop recorder and transmit the information. Keep all follow-up visits as told by your health care provider.  This is important. This information is not intended to replace advice given to you by your health care provider. Make sure you discuss any questions you have with your health care provider. Document Released: 01/31/2015 Document Revised: 04/06/2017 Document Reviewed: 04/06/2017 Elsevier Patient Education   2020 [employer].

## 2022-09-11 LAB — CBC
MCH: 29 pg (ref 26.6–33.0)
MCHC: 32 g/dL (ref 31.5–35.7)
MCV: 90 fL (ref 79–97)
RBC: 3.14 x10E6/uL — ABNORMAL LOW (ref 3.77–5.28)
WBC: 12.6 10*3/uL — ABNORMAL HIGH (ref 3.4–10.8)

## 2022-09-11 LAB — COMPREHENSIVE METABOLIC PANEL
ALT: 17 IU/L (ref 0–32)
AST: 20 IU/L (ref 0–40)
Albumin: 3.9 g/dL (ref 3.7–4.7)
BUN/Creatinine Ratio: 35 — ABNORMAL HIGH (ref 12–28)
BUN: 45 mg/dL — ABNORMAL HIGH (ref 8–27)
Bilirubin Total: 0.3 mg/dL (ref 0.0–1.2)
CO2: 26 mmol/L (ref 20–29)
Creatinine, Ser: 1.3 mg/dL — ABNORMAL HIGH (ref 0.57–1.00)
Globulin, Total: 2.1 g/dL (ref 1.5–4.5)
Glucose: 158 mg/dL — ABNORMAL HIGH (ref 70–99)

## 2022-09-11 LAB — TSH: TSH: 1.5 u[IU]/mL (ref 0.450–4.500)

## 2022-09-12 DIAGNOSIS — M5416 Radiculopathy, lumbar region: Secondary | ICD-10-CM | POA: Diagnosis not present

## 2022-09-13 DIAGNOSIS — I130 Hypertensive heart and chronic kidney disease with heart failure and stage 1 through stage 4 chronic kidney disease, or unspecified chronic kidney disease: Secondary | ICD-10-CM | POA: Diagnosis not present

## 2022-09-13 DIAGNOSIS — E1122 Type 2 diabetes mellitus with diabetic chronic kidney disease: Secondary | ICD-10-CM | POA: Diagnosis not present

## 2022-09-13 DIAGNOSIS — E1165 Type 2 diabetes mellitus with hyperglycemia: Secondary | ICD-10-CM | POA: Diagnosis not present

## 2022-09-13 DIAGNOSIS — I509 Heart failure, unspecified: Secondary | ICD-10-CM | POA: Diagnosis not present

## 2022-09-13 DIAGNOSIS — N1832 Chronic kidney disease, stage 3b: Secondary | ICD-10-CM | POA: Diagnosis not present

## 2022-09-13 DIAGNOSIS — Z48815 Encounter for surgical aftercare following surgery on the digestive system: Secondary | ICD-10-CM | POA: Diagnosis not present

## 2022-09-14 DIAGNOSIS — Z794 Long term (current) use of insulin: Secondary | ICD-10-CM | POA: Diagnosis not present

## 2022-09-14 DIAGNOSIS — E875 Hyperkalemia: Secondary | ICD-10-CM | POA: Diagnosis not present

## 2022-09-14 DIAGNOSIS — Z7952 Long term (current) use of systemic steroids: Secondary | ICD-10-CM | POA: Diagnosis not present

## 2022-09-14 DIAGNOSIS — Z7984 Long term (current) use of oral hypoglycemic drugs: Secondary | ICD-10-CM | POA: Diagnosis not present

## 2022-09-14 DIAGNOSIS — Z48815 Encounter for surgical aftercare following surgery on the digestive system: Secondary | ICD-10-CM | POA: Diagnosis not present

## 2022-09-14 DIAGNOSIS — E1165 Type 2 diabetes mellitus with hyperglycemia: Secondary | ICD-10-CM | POA: Diagnosis not present

## 2022-09-14 DIAGNOSIS — I051 Rheumatic mitral insufficiency: Secondary | ICD-10-CM | POA: Diagnosis not present

## 2022-09-14 DIAGNOSIS — K219 Gastro-esophageal reflux disease without esophagitis: Secondary | ICD-10-CM | POA: Diagnosis not present

## 2022-09-14 DIAGNOSIS — E1151 Type 2 diabetes mellitus with diabetic peripheral angiopathy without gangrene: Secondary | ICD-10-CM | POA: Diagnosis not present

## 2022-09-14 DIAGNOSIS — L409 Psoriasis, unspecified: Secondary | ICD-10-CM | POA: Diagnosis not present

## 2022-09-14 DIAGNOSIS — M069 Rheumatoid arthritis, unspecified: Secondary | ICD-10-CM | POA: Diagnosis not present

## 2022-09-14 DIAGNOSIS — E785 Hyperlipidemia, unspecified: Secondary | ICD-10-CM | POA: Diagnosis not present

## 2022-09-14 DIAGNOSIS — H9193 Unspecified hearing loss, bilateral: Secondary | ICD-10-CM | POA: Diagnosis not present

## 2022-09-14 DIAGNOSIS — I509 Heart failure, unspecified: Secondary | ICD-10-CM | POA: Diagnosis not present

## 2022-09-14 DIAGNOSIS — M179 Osteoarthritis of knee, unspecified: Secondary | ICD-10-CM | POA: Diagnosis not present

## 2022-09-14 DIAGNOSIS — N179 Acute kidney failure, unspecified: Secondary | ICD-10-CM | POA: Diagnosis not present

## 2022-09-14 DIAGNOSIS — I050 Rheumatic mitral stenosis: Secondary | ICD-10-CM | POA: Diagnosis not present

## 2022-09-14 DIAGNOSIS — I130 Hypertensive heart and chronic kidney disease with heart failure and stage 1 through stage 4 chronic kidney disease, or unspecified chronic kidney disease: Secondary | ICD-10-CM | POA: Diagnosis not present

## 2022-09-14 DIAGNOSIS — I890 Lymphedema, not elsewhere classified: Secondary | ICD-10-CM | POA: Diagnosis not present

## 2022-09-14 DIAGNOSIS — I480 Paroxysmal atrial fibrillation: Secondary | ICD-10-CM | POA: Diagnosis not present

## 2022-09-14 DIAGNOSIS — M810 Age-related osteoporosis without current pathological fracture: Secondary | ICD-10-CM | POA: Diagnosis not present

## 2022-09-14 DIAGNOSIS — K5730 Diverticulosis of large intestine without perforation or abscess without bleeding: Secondary | ICD-10-CM | POA: Diagnosis not present

## 2022-09-14 DIAGNOSIS — N281 Cyst of kidney, acquired: Secondary | ICD-10-CM | POA: Diagnosis not present

## 2022-09-14 DIAGNOSIS — E1122 Type 2 diabetes mellitus with diabetic chronic kidney disease: Secondary | ICD-10-CM | POA: Diagnosis not present

## 2022-09-14 DIAGNOSIS — N1832 Chronic kidney disease, stage 3b: Secondary | ICD-10-CM | POA: Diagnosis not present

## 2022-09-19 DIAGNOSIS — E1165 Type 2 diabetes mellitus with hyperglycemia: Secondary | ICD-10-CM | POA: Diagnosis not present

## 2022-09-19 DIAGNOSIS — I130 Hypertensive heart and chronic kidney disease with heart failure and stage 1 through stage 4 chronic kidney disease, or unspecified chronic kidney disease: Secondary | ICD-10-CM | POA: Diagnosis not present

## 2022-09-19 DIAGNOSIS — I509 Heart failure, unspecified: Secondary | ICD-10-CM | POA: Diagnosis not present

## 2022-09-19 DIAGNOSIS — E1122 Type 2 diabetes mellitus with diabetic chronic kidney disease: Secondary | ICD-10-CM | POA: Diagnosis not present

## 2022-09-19 DIAGNOSIS — N1832 Chronic kidney disease, stage 3b: Secondary | ICD-10-CM | POA: Diagnosis not present

## 2022-09-19 DIAGNOSIS — Z48815 Encounter for surgical aftercare following surgery on the digestive system: Secondary | ICD-10-CM | POA: Diagnosis not present

## 2022-09-20 DIAGNOSIS — I130 Hypertensive heart and chronic kidney disease with heart failure and stage 1 through stage 4 chronic kidney disease, or unspecified chronic kidney disease: Secondary | ICD-10-CM | POA: Diagnosis not present

## 2022-09-20 DIAGNOSIS — I509 Heart failure, unspecified: Secondary | ICD-10-CM | POA: Diagnosis not present

## 2022-09-20 DIAGNOSIS — Z48815 Encounter for surgical aftercare following surgery on the digestive system: Secondary | ICD-10-CM | POA: Diagnosis not present

## 2022-09-20 DIAGNOSIS — E1165 Type 2 diabetes mellitus with hyperglycemia: Secondary | ICD-10-CM | POA: Diagnosis not present

## 2022-09-20 DIAGNOSIS — N1832 Chronic kidney disease, stage 3b: Secondary | ICD-10-CM | POA: Diagnosis not present

## 2022-09-20 DIAGNOSIS — E1122 Type 2 diabetes mellitus with diabetic chronic kidney disease: Secondary | ICD-10-CM | POA: Diagnosis not present

## 2022-09-21 DIAGNOSIS — Z48815 Encounter for surgical aftercare following surgery on the digestive system: Secondary | ICD-10-CM | POA: Diagnosis not present

## 2022-09-21 DIAGNOSIS — E1122 Type 2 diabetes mellitus with diabetic chronic kidney disease: Secondary | ICD-10-CM | POA: Diagnosis not present

## 2022-09-21 DIAGNOSIS — E1165 Type 2 diabetes mellitus with hyperglycemia: Secondary | ICD-10-CM | POA: Diagnosis not present

## 2022-09-21 DIAGNOSIS — I509 Heart failure, unspecified: Secondary | ICD-10-CM | POA: Diagnosis not present

## 2022-09-21 DIAGNOSIS — I130 Hypertensive heart and chronic kidney disease with heart failure and stage 1 through stage 4 chronic kidney disease, or unspecified chronic kidney disease: Secondary | ICD-10-CM | POA: Diagnosis not present

## 2022-09-21 DIAGNOSIS — N1832 Chronic kidney disease, stage 3b: Secondary | ICD-10-CM | POA: Diagnosis not present

## 2022-09-24 DIAGNOSIS — Z48815 Encounter for surgical aftercare following surgery on the digestive system: Secondary | ICD-10-CM | POA: Diagnosis not present

## 2022-09-24 DIAGNOSIS — I509 Heart failure, unspecified: Secondary | ICD-10-CM | POA: Diagnosis not present

## 2022-09-24 DIAGNOSIS — E1165 Type 2 diabetes mellitus with hyperglycemia: Secondary | ICD-10-CM | POA: Diagnosis not present

## 2022-09-24 DIAGNOSIS — E1122 Type 2 diabetes mellitus with diabetic chronic kidney disease: Secondary | ICD-10-CM | POA: Diagnosis not present

## 2022-09-24 DIAGNOSIS — I130 Hypertensive heart and chronic kidney disease with heart failure and stage 1 through stage 4 chronic kidney disease, or unspecified chronic kidney disease: Secondary | ICD-10-CM | POA: Diagnosis not present

## 2022-09-24 DIAGNOSIS — N1832 Chronic kidney disease, stage 3b: Secondary | ICD-10-CM | POA: Diagnosis not present

## 2022-09-26 DIAGNOSIS — E785 Hyperlipidemia, unspecified: Secondary | ICD-10-CM | POA: Diagnosis not present

## 2022-09-26 DIAGNOSIS — I130 Hypertensive heart and chronic kidney disease with heart failure and stage 1 through stage 4 chronic kidney disease, or unspecified chronic kidney disease: Secondary | ICD-10-CM | POA: Diagnosis not present

## 2022-09-26 DIAGNOSIS — K8512 Biliary acute pancreatitis with infected necrosis: Secondary | ICD-10-CM | POA: Diagnosis not present

## 2022-09-26 DIAGNOSIS — E1140 Type 2 diabetes mellitus with diabetic neuropathy, unspecified: Secondary | ICD-10-CM | POA: Diagnosis not present

## 2022-09-26 DIAGNOSIS — N1832 Chronic kidney disease, stage 3b: Secondary | ICD-10-CM | POA: Diagnosis not present

## 2022-09-26 DIAGNOSIS — Z794 Long term (current) use of insulin: Secondary | ICD-10-CM | POA: Diagnosis not present

## 2022-09-27 DIAGNOSIS — N1832 Chronic kidney disease, stage 3b: Secondary | ICD-10-CM | POA: Diagnosis not present

## 2022-09-27 DIAGNOSIS — I509 Heart failure, unspecified: Secondary | ICD-10-CM | POA: Diagnosis not present

## 2022-09-27 DIAGNOSIS — Z48815 Encounter for surgical aftercare following surgery on the digestive system: Secondary | ICD-10-CM | POA: Diagnosis not present

## 2022-09-27 DIAGNOSIS — I130 Hypertensive heart and chronic kidney disease with heart failure and stage 1 through stage 4 chronic kidney disease, or unspecified chronic kidney disease: Secondary | ICD-10-CM | POA: Diagnosis not present

## 2022-09-27 DIAGNOSIS — E1122 Type 2 diabetes mellitus with diabetic chronic kidney disease: Secondary | ICD-10-CM | POA: Diagnosis not present

## 2022-09-27 DIAGNOSIS — E1165 Type 2 diabetes mellitus with hyperglycemia: Secondary | ICD-10-CM | POA: Diagnosis not present

## 2022-09-28 ENCOUNTER — Ambulatory Visit: Payer: Medicare Other

## 2022-09-28 NOTE — Progress Notes (Signed)
Attempted shoe fitting shoes were too snug need to re-order in WD width length was good - Addison Bailey CPed, CFo, CFm

## 2022-10-01 DIAGNOSIS — Z6821 Body mass index (BMI) 21.0-21.9, adult: Secondary | ICD-10-CM | POA: Diagnosis not present

## 2022-10-01 DIAGNOSIS — E1165 Type 2 diabetes mellitus with hyperglycemia: Secondary | ICD-10-CM | POA: Diagnosis not present

## 2022-10-01 DIAGNOSIS — I130 Hypertensive heart and chronic kidney disease with heart failure and stage 1 through stage 4 chronic kidney disease, or unspecified chronic kidney disease: Secondary | ICD-10-CM | POA: Diagnosis not present

## 2022-10-01 DIAGNOSIS — M069 Rheumatoid arthritis, unspecified: Secondary | ICD-10-CM | POA: Diagnosis not present

## 2022-10-01 DIAGNOSIS — Z48815 Encounter for surgical aftercare following surgery on the digestive system: Secondary | ICD-10-CM | POA: Diagnosis not present

## 2022-10-01 DIAGNOSIS — I509 Heart failure, unspecified: Secondary | ICD-10-CM | POA: Diagnosis not present

## 2022-10-01 DIAGNOSIS — M7062 Trochanteric bursitis, left hip: Secondary | ICD-10-CM | POA: Diagnosis not present

## 2022-10-01 DIAGNOSIS — N1832 Chronic kidney disease, stage 3b: Secondary | ICD-10-CM | POA: Diagnosis not present

## 2022-10-01 DIAGNOSIS — M810 Age-related osteoporosis without current pathological fracture: Secondary | ICD-10-CM | POA: Diagnosis not present

## 2022-10-01 DIAGNOSIS — Z79899 Other long term (current) drug therapy: Secondary | ICD-10-CM | POA: Diagnosis not present

## 2022-10-01 DIAGNOSIS — E1122 Type 2 diabetes mellitus with diabetic chronic kidney disease: Secondary | ICD-10-CM | POA: Diagnosis not present

## 2022-10-01 DIAGNOSIS — M1991 Primary osteoarthritis, unspecified site: Secondary | ICD-10-CM | POA: Diagnosis not present

## 2022-10-01 DIAGNOSIS — M5136 Other intervertebral disc degeneration, lumbar region: Secondary | ICD-10-CM | POA: Diagnosis not present

## 2022-10-01 DIAGNOSIS — M25562 Pain in left knee: Secondary | ICD-10-CM | POA: Diagnosis not present

## 2022-10-01 DIAGNOSIS — L409 Psoriasis, unspecified: Secondary | ICD-10-CM | POA: Diagnosis not present

## 2022-10-01 DIAGNOSIS — S91109A Unspecified open wound of unspecified toe(s) without damage to nail, initial encounter: Secondary | ICD-10-CM | POA: Diagnosis not present

## 2022-10-02 DIAGNOSIS — N1832 Chronic kidney disease, stage 3b: Secondary | ICD-10-CM | POA: Diagnosis not present

## 2022-10-02 DIAGNOSIS — Z48815 Encounter for surgical aftercare following surgery on the digestive system: Secondary | ICD-10-CM | POA: Diagnosis not present

## 2022-10-02 DIAGNOSIS — I509 Heart failure, unspecified: Secondary | ICD-10-CM | POA: Diagnosis not present

## 2022-10-02 DIAGNOSIS — E1165 Type 2 diabetes mellitus with hyperglycemia: Secondary | ICD-10-CM | POA: Diagnosis not present

## 2022-10-02 DIAGNOSIS — I130 Hypertensive heart and chronic kidney disease with heart failure and stage 1 through stage 4 chronic kidney disease, or unspecified chronic kidney disease: Secondary | ICD-10-CM | POA: Diagnosis not present

## 2022-10-02 DIAGNOSIS — E1122 Type 2 diabetes mellitus with diabetic chronic kidney disease: Secondary | ICD-10-CM | POA: Diagnosis not present

## 2022-10-04 DIAGNOSIS — Z48815 Encounter for surgical aftercare following surgery on the digestive system: Secondary | ICD-10-CM | POA: Diagnosis not present

## 2022-10-04 DIAGNOSIS — N1832 Chronic kidney disease, stage 3b: Secondary | ICD-10-CM | POA: Diagnosis not present

## 2022-10-04 DIAGNOSIS — E1122 Type 2 diabetes mellitus with diabetic chronic kidney disease: Secondary | ICD-10-CM | POA: Diagnosis not present

## 2022-10-04 DIAGNOSIS — I509 Heart failure, unspecified: Secondary | ICD-10-CM | POA: Diagnosis not present

## 2022-10-04 DIAGNOSIS — I130 Hypertensive heart and chronic kidney disease with heart failure and stage 1 through stage 4 chronic kidney disease, or unspecified chronic kidney disease: Secondary | ICD-10-CM | POA: Diagnosis not present

## 2022-10-04 DIAGNOSIS — E1165 Type 2 diabetes mellitus with hyperglycemia: Secondary | ICD-10-CM | POA: Diagnosis not present

## 2022-10-05 ENCOUNTER — Encounter (HOSPITAL_BASED_OUTPATIENT_CLINIC_OR_DEPARTMENT_OTHER): Payer: Medicare Other | Attending: Internal Medicine | Admitting: Internal Medicine

## 2022-10-05 DIAGNOSIS — E11622 Type 2 diabetes mellitus with other skin ulcer: Secondary | ICD-10-CM | POA: Diagnosis not present

## 2022-10-05 DIAGNOSIS — L89312 Pressure ulcer of right buttock, stage 2: Secondary | ICD-10-CM | POA: Diagnosis not present

## 2022-10-05 DIAGNOSIS — M069 Rheumatoid arthritis, unspecified: Secondary | ICD-10-CM | POA: Insufficient documentation

## 2022-10-05 NOTE — Progress Notes (Signed)
Shelby, Walker ([id number]) [id number] Page 1 of 7 Visit Report for 10/05/2022 Chief Complaint Document Details Patient Name: Date of Service: Shelby Walker, MODE 10/05/2022 10:15 A M Medical Record Number: [id number] Patient Account Number: [id number] Date of Birth/Sex: Treating RN: Apr 13, 1933 (89 y.o. F) Primary Care Provider: Adrian Prince Other Clinician: Referring Provider: Treating Provider/Extender: Ignatius Specking in Treatment: 16 Information Obtained from: Patient Chief Complaint 06/01/2022; previous history of sacral ulcer and bilateral feet wounds. 10/05/2022; right buttocks wound Electronic Signature(s) Signed: 10/05/2022 11:32:50 AM By: Geralyn Corwin DO Entered By: Geralyn Corwin on 10/05/2022 11:28:07 -------------------------------------------------------------------------------- HPI Details Patient Name: Date of Service: Shelby Walker 10/05/2022 10:15 A M Medical Record Number: [id number] Patient Account Number: [id number] Date of Birth/Sex: Treating RN: Jul 01, 1933 (89 y.o. F) Primary Care Provider: Adrian Prince Other Clinician: Referring Provider: Treating Provider/Extender: Ignatius Specking in Treatment: 16 History of Present Illness HPI Description: 06/01/2022 Shelby Walker is an 89 year old female with a past medical history of type 2 diabetes with previous transmetatarsal of the right foot, rheumatoid arthritis that presents to the clinic for concern of wounds to her sacrum and bilateral feet. She was referred by infectious disease and orthopedic surgery for these issues. Daughter is present and helps provide the history. She states that 6 weeks ago she developed pressure injuries and superficial wounds to her feet. She was treated with a long course of antibiotics Due to concern for infection. She has been using Silvadene cream to the areas. She is also been using bunny boots. Over the past several  weeks the wounds have healed. She also developed a sore to her sacrum 2 to 3 weeks ago. She has been keeping the area clean. This area is also healed. She currently denies signs of infection. 4/8; patient was last seen 1 week ago as a consult for her previous left foot wound that was healed. She had a stage I pressure ulcer to her sacrum at that time. Unfortunately this has progressed. She sits for long periods of time in her recliner and couch throughout the day. She is able to ambulate with a walker however it does not appear to be doing very much outside of going to the bathroom and going to the kitchen to eat. She currently denies signs of infection. 4/15; patient presents for follow-up. She has been using Medihoney and Hydrofera Blue to the sacral wound. She has no issues or complaints today. She has tolerated this well. The wound is smaller. 4/29; patient presents for follow-up. She has been using Medihoney and Hydrofera Blue to the sacral wound. It is almost healed. She reports being diligent about her offloading. 5/13; patient presents for follow-up. She has been using Medihoney and Hydrofera Blue to the sacral wound. It is healed today. 10/05/2022 Shelby Walker is an 89 year old female with a past medical history of rheumatoid arthritis, type 2 diabetes that presents to the clinic for a 2-week history of wound to her right buttocks. She has been treated in our clinic for similar wounds in the past. She has been using Medihoney to the wound bed. She has no issues or complaints today. She denies signs of infection. Electronic Signature(s) Signed: 10/05/2022 11:32:50 AM By: Rosine Door, Algernon Huxley ([id number]) [id number] Page 2 of 7 Entered By: Geralyn Corwin on 10/05/2022 11:28:55 -------------------------------------------------------------------------------- Physical Exam Details Patient Name: Date of Service: Shelby Walker, CHAY 10/05/2022 10:15 A  M Medical Record Number: [id number] Patient Account Number: [id number]  Date of Birth/Sex: Treating RN: [birth year] (89 y.o. F) Primary Care Provider: Adrian Prince Other Clinician: Referring Provider: Treating Provider/Extender: Ignatius Specking in Treatment: 16 Constitutional respirations regular, non-labored and within target range for patient.. Cardiovascular 2+ dorsalis pedis/posterior tibialis pulses. Psychiatric pleasant and cooperative. Notes T the right buttocks there is an open wound with granulation tissue present. No signs of surrounding soft tissue infection including increased warmth, erythema o or purulent drainage. Electronic Signature(s) Signed: 10/05/2022 11:32:50 AM By: Geralyn Corwin DO Entered By: Geralyn Corwin on 10/05/2022 11:30:48 -------------------------------------------------------------------------------- Physician Orders Details Patient Name: Date of Service: Shelby Walker 10/05/2022 10:15 A M Medical Record Number: [id number] Patient Account Number: [id number] Date of Birth/Sex: Treating RN: 27-Jun-1933 (89 y.o. [name] Primary Care Provider: Adrian Prince Other Clinician: Referring Provider: Treating Provider/Extender: Ignatius Specking in Treatment: 16 Verbal / Phone Orders: No Diagnosis Coding Follow-up Appointments ppointment in 1 week. - 1230 10/11/2022 Dr. Mikey Bussing room 9 Thursday Return A Anesthetic (In clinic) Topical Lidocaine 4% applied to wound bed Bathing/ Shower/ Hygiene May shower and wash wound with soap and water. Wound Treatment Wound #2 - Gluteus Wound Laterality: Right Cleanser: Soap and Water 1 x Per Day/30 Days Discharge Instructions: May shower and wash wound with dial antibacterial soap and water prior to dressing change. Cleanser: Wound Cleanser 1 x Per Day/30 Days Discharge Instructions: Cleanse the wound with wound cleanser prior to applying a clean dressing using  gauze sponges, not tissue or cotton balls. Peri-Wound Care: Skin Prep 1 x Per Day/30 Days Discharge Instructions: Use skin prep as directed SHAINDEL, SWEETEN ([id number]) [id number] Page 3 of 7 Prim Dressing: Hydrofera Blue Ready Transfer Foam, 2.5x2.5 (in/in) 1 x Per Day/30 Days ary Discharge Instructions: Apply directly to wound bed as directed Prim Dressing: MediHoney Gel, tube 1.5 (oz) 1 x Per Day/30 Days ary Discharge Instructions: Apply to wound bed as instructed Secondary Dressing: Zetuvit Plus Silicone Border Dressing 4x4 (in/in) 1 x Per Day/30 Days Discharge Instructions: Apply silicone border over primary dressing as directed. Patient Medications llergies: cholestyramine, Compazine, Sulfa (Sulfonamide Antibiotics), hydrocodone, infliximab, leflunomide, prochlorperazine, rosuvastatin A Notifications Medication Indication Start End as needed for any 10/05/2022 lidocaine debridements DOSE topical 5 % cream - cream topical once daily Electronic Signature(s) Signed: 10/05/2022 11:32:50 AM By: Geralyn Corwin DO Entered By: Geralyn Corwin on 10/05/2022 11:30:56 -------------------------------------------------------------------------------- Problem List Details Patient Name: Date of Service: Shelby Walker 10/05/2022 10:15 A M Medical Record Number: [id number] Patient Account Number: [id number] Date of Birth/Sex: Treating RN: 31-Mar-1933 (89 y.o. F) Primary Care Provider: Adrian Prince Other Clinician: Referring Provider: Treating Provider/Extender: Ignatius Specking in Treatment: 16 Active Problems ICD-10 Encounter Code Description Active Date MDM Diagnosis L89.312 Pressure ulcer of right buttock, stage 2 10/05/2022 No Yes E11.622 Type 2 diabetes mellitus with other skin ulcer 06/11/2022 No Yes M06.9 Rheumatoid arthritis, unspecified 06/11/2022 No Yes Inactive Problems Resolved Problems ICD-10 Code Description Active Date Resolved  Date L89.153 Pressure ulcer of sacral region, stage 3 06/11/2022 06/11/2022 Electronic Signature(s) Signed: 10/05/2022 11:32:50 AM By: Rosine Door, Algernon Huxley ([id number]) [id number] Page 4 of 7 Entered By: Geralyn Corwin on 10/05/2022 11:27:43 -------------------------------------------------------------------------------- Progress Note Details Patient Name: Date of Service: COCO, SHARPNACK 10/05/2022 10:15 A M Medical Record Number: [id number] Patient Account Number: [id number] Date of Birth/Sex: Treating RN: 1933/08/11 (89 y.o. F) Primary Care Provider: Adrian Prince Other Clinician: Referring Provider: Treating Provider/Extender: Ignatius Specking in Treatment:  16 Subjective Chief Complaint Information obtained from Patient 06/01/2022; previous history of sacral ulcer and bilateral feet wounds. 10/05/2022; right buttocks wound History of Present Illness (HPI) 06/01/2022 Ms. Swati Granberry is an 89 year old female with a past medical history of type 2 diabetes with previous transmetatarsal of the right foot, rheumatoid arthritis that presents to the clinic for concern of wounds to her sacrum and bilateral feet. She was referred by infectious disease and orthopedic surgery for these issues. Daughter is present and helps provide the history. She states that 6 weeks ago she developed pressure injuries and superficial wounds to her feet. She was treated with a long course of antibiotics Due to concern for infection. She has been using Silvadene cream to the areas. She is also been using bunny boots. Over the past several weeks the wounds have healed. She also developed a sore to her sacrum 2 to 3 weeks ago. She has been keeping the area clean. This area is also healed. She currently denies signs of infection. 4/8; patient was last seen 1 week ago as a consult for her previous left foot wound that was healed. She had a stage I pressure ulcer to her  sacrum at that time. Unfortunately this has progressed. She sits for long periods of time in her recliner and couch throughout the day. She is able to ambulate with a walker however it does not appear to be doing very much outside of going to the bathroom and going to the kitchen to eat. She currently denies signs of infection. 4/15; patient presents for follow-up. She has been using Medihoney and Hydrofera Blue to the sacral wound. She has no issues or complaints today. She has tolerated this well. The wound is smaller. 4/29; patient presents for follow-up. She has been using Medihoney and Hydrofera Blue to the sacral wound. It is almost healed. She reports being diligent about her offloading. 5/13; patient presents for follow-up. She has been using Medihoney and Hydrofera Blue to the sacral wound. It is healed today. 10/05/2022 [name] is an 89 year old female with a past medical history of rheumatoid arthritis, type 2 diabetes that presents to the clinic for a 2-week history of wound to her right buttocks. She has been treated in our clinic for similar wounds in the past. She has been using Medihoney to the wound bed. She has no issues or complaints today. She denies signs of infection. Patient History Information obtained from Patient, Chart. Family History Unknown History. Social History Never smoker, Marital Status - Widowed, Alcohol Use - Never, Drug Use - No History, Caffeine Use - Rarely. Medical History Endocrine Patient has history of Type II Diabetes Musculoskeletal Patient has history of Osteoarthritis Hospitalization/Surgery History - Ppm generator removal. - tee without conversion. - pacemaker implaint. - loop recorder removal. - transmet. amp. right. - abdominal aortogram w/ lower extremity right. - fracture surgery. Medical A Surgical History Notes nd Cardiovascular mitral regurg., bruit, chest pain,unspecified, dyslipidemia, ejection fraction 55-60 % ,  palpitations Objective TENESSA, MARSEE ([id number]) [id number] Page 5 of 7 Constitutional respirations regular, non-labored and within target range for patient.. Vitals Time Taken: 10:38 AM, Height: 61 in, Weight: 110 lbs, BMI: 20.8, Temperature: 98.2 F, Pulse: 73 bpm, Respiratory Rate: 16 breaths/min, Blood Pressure: 146/74 mmHg, Capillary Blood Glucose: 140 mg/dl. Cardiovascular 2+ dorsalis pedis/posterior tibialis pulses. Psychiatric pleasant and cooperative. General Notes: T the right buttocks there is an open wound with granulation tissue present. No signs of surrounding soft tissue infection including increased o  warmth, erythema or purulent drainage. Integumentary (Hair, Skin) Wound #2 status is Open. Original cause of wound was Gradually Appeared. The date acquired was: 09/28/2022. The wound is located on the Right Gluteus. The wound measures 0.7cm length x 0.6cm width x 0.1cm depth; 0.33cm^2 area and 0.033cm^3 volume. There is Fat Layer (Subcutaneous Tissue) exposed. There is no tunneling or undermining noted. There is a medium amount of serosanguineous drainage noted. There is medium (34-66%) pink granulation within the wound bed. There is a medium (34-66%) amount of necrotic tissue within the wound bed including Eschar and Adherent Slough. The periwound skin appearance had no abnormalities noted for texture. The periwound skin appearance had no abnormalities noted for moisture. Periwound temperature was noted as No Abnormality. Assessment Active Problems ICD-10 Pressure ulcer of right buttock, stage 2 Type 2 diabetes mellitus with other skin ulcer Rheumatoid arthritis, unspecified Patient presents with a 2-week history of pressure ulcer to the right buttocks. I recommended Medihoney and Hydrofera Blue as this has worked well for her in the past for these type of wounds. We also discussed the importance of aggressive offloading to heal. Also recommended  increasing protein intake to help with wound healing. Follow-up in 1 week. Plan Follow-up Appointments: Return Appointment in 1 week. - 1230 10/11/2022 Dr. Mikey Bussing room 9 Thursday Anesthetic: (In clinic) Topical Lidocaine 4% applied to wound bed Bathing/ Shower/ Hygiene: May shower and wash wound with soap and water. The following medication(s) was prescribed: lidocaine topical 5 % cream cream topical once daily for as needed for any debridements was prescribed at facility WOUND #2: - Gluteus Wound Laterality: Right Cleanser: Soap and Water 1 x Per Day/30 Days Discharge Instructions: May shower and wash wound with dial antibacterial soap and water prior to dressing change. Cleanser: Wound Cleanser 1 x Per Day/30 Days Discharge Instructions: Cleanse the wound with wound cleanser prior to applying a clean dressing using gauze sponges, not tissue or cotton balls. Peri-Wound Care: Skin Prep 1 x Per Day/30 Days Discharge Instructions: Use skin prep as directed Prim Dressing: Hydrofera Blue Ready Transfer Foam, 2.5x2.5 (in/in) 1 x Per Day/30 Days ary Discharge Instructions: Apply directly to wound bed as directed Prim Dressing: MediHoney Gel, tube 1.5 (oz) 1 x Per Day/30 Days ary Discharge Instructions: Apply to wound bed as instructed Secondary Dressing: Zetuvit Plus Silicone Border Dressing 4x4 (in/in) 1 x Per Day/30 Days Discharge Instructions: Apply silicone border over primary dressing as directed. 1. Medihoney and Hydrofera Blue 2. Aggressive offloading 3. Follow-up in 1 week Electronic Signature(s) Signed: 10/05/2022 11:32:50 AM By: Geralyn Corwin DO Entered By: Geralyn Corwin on 10/05/2022 11:31:44 Despina Arias ([id number]) [id number] Page 6 of 7 -------------------------------------------------------------------------------- HxROS Details Patient Name: Date of Service: Shelby Walker, DOUGHMAN 10/05/2022 10:15 A M Medical Record Number: [id number] Patient  Account Number: [id number] Date of Birth/Sex: Treating RN: 1933/09/11 (89 y.o. F) Primary Care Provider: Adrian Prince Other Clinician: Referring Provider: Treating Provider/Extender: Ignatius Specking in Treatment: 16 Information Obtained From Patient Chart Cardiovascular Medical History: Past Medical History Notes: mitral regurg., bruit, chest pain,unspecified, dyslipidemia, ejection fraction 55-60 % , palpitations Endocrine Medical History: Positive for: Type II Diabetes Musculoskeletal Medical History: Positive for: Osteoarthritis Immunizations Pneumococcal Vaccine: Received Pneumococcal Vaccination: No Implantable Devices None Hospitalization / Surgery History Type of Hospitalization/Surgery Ppm generator removal tee without conversion pacemaker implaint loop recorder removal transmet. amp. right abdominal aortogram w/ lower extremity right fracture surgery Family and Social History Unknown History: Yes; Never smoker; Marital Status - Widowed;  Alcohol Use: Never; Drug Use: No History; Caffeine Use: Rarely; Financial Concerns: No; Food, Clothing or Shelter Needs: No; Support System Lacking: No; Transportation Concerns: No Electronic Signature(s) Signed: 10/05/2022 11:32:50 AM By: Geralyn Corwin DO Entered By: Geralyn Corwin on 10/05/2022 11:29:04 -------------------------------------------------------------------------------- SuperBill Details Patient Name: Date of Service: Shelby Walker 10/05/2022 Medical Record Number: [id number] Patient Account Number: [id number] Date of Birth/Sex: Treating RN: Mar 30, 1933 (89 y.o. F) Primary Care Provider: Adrian Prince Other Clinician: Despina Arias ([id number]) [id number] Page 7 of 7 Referring Provider: Treating Provider/Extender: Ignatius Specking in Treatment: 16 Diagnosis Coding ICD-10 Codes Code Description [phone] Pressure ulcer of right buttock,  stage 2 E11.622 Type 2 diabetes mellitus with other skin ulcer M06.9 Rheumatoid arthritis, unspecified Facility Procedures : CPT4 Code: [id number] Description: 99213 - WOUND CARE VISIT-LEV 3 EST PT Modifier: Quantity: 1 Physician Procedures : CPT4 Code Description Modifier [id number] 99213 - WC PHYS LEVEL 3 - EST PT ICD-10 Diagnosis Description L89.312 Pressure ulcer of right buttock, stage 2 E11.622 Type 2 diabetes mellitus with other skin ulcer M06.9 Rheumatoid arthritis, unspecified Quantity: 1 Electronic Signature(s) Unsigned Previous Signature: 10/05/2022 11:32:50 AM Version By: Geralyn Corwin DO Entered By: Karie Schwalbe on 10/05/2022 11:45:47 Signature(s): Date(s):

## 2022-10-05 NOTE — Progress Notes (Signed)
Shelby Walker, Shelby Walker ([id number]) [id number] Page 1 of 8 Visit Report for 10/05/2022 Arrival Information Details Patient Name: Date of Service: Shelby Walker, Shelby Walker 10/05/2022 10:15 A M Medical Record Number: [id number] Patient Account Number: [id number] Date of Birth/Sex: Treating RN: 23-May-1933 (89 y.o. [name] Primary Care : Adrian Prince Other Clinician: Referring : Treating /Extender: Ignatius Specking in Treatment: 16 Visit Information History Since Last Visit Added or deleted any medications: No Patient Arrived: [name] Any new allergies or adverse reactions: No Arrival Time: 10:36 Had a fall or experienced change in No Accompanied By: daughter activities of daily living that may affect Transfer Assistance: None risk of falls: Patient Identification Verified: Yes Signs or symptoms of abuse/neglect since last visito No Patient Requires Transmission-Based Precautions: No Hospitalized since last visit: No Patient Has Alerts: No Implantable device outside of the clinic excluding No cellular tissue based products placed in the center since last visit: Pain Present Now: No Electronic Signature(s) Signed: 10/05/2022 12:14:04 PM By: Karie Schwalbe RN Entered By: Karie Schwalbe on 10/05/2022 10:37:37 -------------------------------------------------------------------------------- Clinic Level of Care Assessment Details Patient Name: Date of Service: AURORE, REDINGER 10/05/2022 10:15 A M Medical Record Number: [id number] Patient Account Number: [id number] Date of Birth/Sex: Treating RN: 08/16/1933 (89 y.o. [name] Primary Care : Adrian Prince Other Clinician: Referring : Treating /Extender: Ignatius Specking in Treatment: 16 Clinic Level of Care Assessment Items TOOL 4 Quantity Score X- 1 0 Use when only an EandM is performed on FOLLOW-UP visit ASSESSMENTS -  Nursing Assessment / Reassessment X- 1 10 Reassessment of Co-morbidities (includes updates in patient status) X- 1 5 Reassessment of Adherence to Treatment Plan ASSESSMENTS - Wound and Skin A ssessment / Reassessment X - Simple Wound Assessment / Reassessment - one wound 1 5 []  - 0 Complex Wound Assessment / Reassessment - multiple wounds []  - 0 Dermatologic / Skin Assessment (not related to wound area) ASSESSMENTS - Focused Assessment []  - 0 Circumferential Edema Measurements - multi extremities []  - 0 Nutritional Assessment / Counseling / Intervention []  - 0 Lower Extremity Assessment (monofilament, tuning fork, pulses) Shelby Walker, Shelby Walker ([id number]) [id number] Page 2 of 8 []  - 0 Peripheral Arterial Disease Assessment (using hand held doppler) ASSESSMENTS - Ostomy and/or Continence Assessment and Care []  - 0 Incontinence Assessment and Management []  - 0 Ostomy Care Assessment and Management (repouching, etc.) PROCESS - Coordination of Care X - Simple Patient / Family Education for ongoing care 1 15 []  - 0 Complex (extensive) Patient / Family Education for ongoing care X- 1 10 Staff obtains Chiropractor, Records, T Results / Process Orders est X- 1 10 Staff telephones HHA, Nursing Homes / Clarify orders / etc []  - 0 Routine Transfer to another Facility (non-emergent condition) []  - 0 Routine Hospital Admission (non-emergent condition) []  - 0 New Admissions / Manufacturing engineer / Ordering NPWT Apligraf, etc. , []  - 0 Emergency Hospital Admission (emergent condition) X- 1 10 Simple Discharge Coordination []  - 0 Complex (extensive) Discharge Coordination PROCESS - Special Needs []  - 0 Pediatric / Minor Patient Management []  - 0 Isolation Patient Management []  - 0 Hearing / Language / Visual special needs []  - 0 Assessment of Community assistance (transportation, D/C planning, etc.) []  - 0 Additional assistance / Altered mentation []  -  0 Support Surface(s) Assessment (bed, cushion, seat, etc.) INTERVENTIONS - Wound Cleansing / Measurement X - Simple Wound Cleansing - one wound 1 5 []  - 0 Complex Wound Cleansing -  multiple wounds X- 1 5 Wound Imaging (photographs - any number of wounds) []  - 0 Wound Tracing (instead of photographs) X- 1 5 Simple Wound Measurement - one wound []  - 0 Complex Wound Measurement - multiple wounds INTERVENTIONS - Wound Dressings X - Small Wound Dressing one or multiple wounds 1 10 []  - 0 Medium Wound Dressing one or multiple wounds []  - 0 Large Wound Dressing one or multiple wounds []  - 0 Application of Medications - topical []  - 0 Application of Medications - injection INTERVENTIONS - Miscellaneous []  - 0 External ear exam []  - 0 Specimen Collection (cultures, biopsies, blood, body fluids, etc.) []  - 0 Specimen(s) / Culture(s) sent or taken to Lab for analysis []  - 0 Patient Transfer (multiple staff / Nurse, adult / Similar devices) []  - 0 Simple Staple / Suture removal (25 or less) []  - 0 Complex Staple / Suture removal (26 or more) []  - 0 Hypo / Hyperglycemic Management (close monitor of Blood Glucose) []  - 0 Ankle / Brachial Index (ABI) - do not check if billed separately Shelby Walker, Shelby Walker ([id number]) [id number] Page 3 of 8 X- 1 5 Vital Signs Has the patient been seen at the hospital within the last three years: Yes Total Score: 95 Level Of Care: New/Established - Level 3 Electronic Signature(s) Signed: 10/05/2022 12:14:04 PM By: Karie Schwalbe RN Entered By: Karie Schwalbe on 10/05/2022 11:45:35 -------------------------------------------------------------------------------- Encounter Discharge Information Details Patient Name: Date of Service: Shelby Rings. 10/05/2022 10:15 A M Medical Record Number: [id number] Patient Account Number: [id number] Date of Birth/Sex: Treating RN: 02/11/1934 (89 y.o. [name] Primary Care :  Adrian Prince Other Clinician: Referring : Treating /Extender: Ignatius Specking in Treatment: 16 Encounter Discharge Information Items Discharge Condition: Stable Ambulatory Status: Walker Discharge Destination: Home Transportation: Private Auto Accompanied By: Daughter Schedule Follow-up Appointment: Yes Clinical Summary of Care: Patient Declined Electronic Signature(s) Signed: 10/05/2022 12:14:04 PM By: Karie Schwalbe RN Entered By: Karie Schwalbe on 10/05/2022 11:46:45 -------------------------------------------------------------------------------- Lower Extremity Assessment Details Patient Name: Date of Service: Shelby Walker, Shelby Walker 10/05/2022 10:15 A M Medical Record Number: [id number] Patient Account Number: [id number] Date of Birth/Sex: Treating RN: [birth year] (89 y.o. [name] Primary Care : Adrian Prince Other Clinician: Referring : Treating /Extender: Ignatius Specking in Treatment: 16 Electronic Signature(s) Signed: 10/05/2022 12:14:04 PM By: Karie Schwalbe RN Entered By: Karie Schwalbe on 10/05/2022 10:38:54 -------------------------------------------------------------------------------- Multi Wound Chart Details Patient Name: Date of Service: Shelby Rings 10/05/2022 10:15 A M Medical Record Number: [id number] Patient Account Number: [id number] Date of Birth/Sex: Treating RN: June 03, 1933 (89 y.o. Martia Dalby, Algernon Huxley ([id number]) [id number] Page 4 of 8 Primary Care : Adrian Prince Other Clinician: Referring : Treating /Extender: Ignatius Specking in Treatment: 16 Vital Signs Height(in): 61 Capillary Blood Glucose(mg/dl): 093 Weight(lbs): 235 Pulse(bpm): 73 Body Mass Index(BMI): 20.8 Blood Pressure(mmHg): 146/74 Temperature(F): 98.2 Respiratory Rate(breaths/min): 16 [2:Photos:] [N/A:N/A] Right Gluteus N/A  N/A Wound Location: Gradually Appeared N/A N/A Wounding Event: Pressure Ulcer N/A N/A Primary Etiology: Type II Diabetes, Osteoarthritis N/A N/A Comorbid History: 09/28/2022 N/A N/A Date Acquired: 0 N/A N/A Weeks of Treatment: Open N/A N/A Wound Status: No N/A N/A Wound Recurrence: 0.7x0.6x0.1 N/A N/A Measurements L x W x D (cm) 0.33 N/A N/A A (cm) : rea 0.033 N/A N/A Volume (cm) : Category/Stage II N/A N/A Classification: Medium N/A N/A Exudate A mount: Serosanguineous N/A N/A Exudate Type: red, brown N/A N/A Exudate Color: Medium (34-66%)  N/A N/A Granulation A mount: Pink N/A N/A Granulation Quality: Medium (34-66%) N/A N/A Necrotic A mount: Eschar, Adherent Slough N/A N/A Necrotic Tissue: Fat Layer (Subcutaneous Tissue): Yes N/A N/A Exposed Structures: Small (1-33%) N/A N/A Epithelialization: No Abnormalities Noted N/A N/A Periwound Skin Texture: No Abnormalities Noted N/A N/A Periwound Skin Moisture: No Abnormality N/A N/A Temperature: Treatment Notes Electronic Signature(s) Signed: 10/05/2022 11:32:50 AM By: Geralyn Corwin DO Entered By: Geralyn Corwin on 10/05/2022 11:27:48 -------------------------------------------------------------------------------- Multi-Disciplinary Care Plan Details Patient Name: Date of Service: Shelby Rings. 10/05/2022 10:15 A M Medical Record Number: [id number] Patient Account Number: [id number] Date of Birth/Sex: Treating RN: 1933/06/03 (89 y.o. [name] Primary Care : Adrian Prince Other Clinician: Referring : Treating /Extender: Ignatius Specking in Treatment: [address] Inactive Nutrition Shelby Walker, Shelby Walker ([id number]) [id number] Page 5 of 8 Nursing Diagnoses: Impaired glucose control: actual or potential Goals: Patient/caregiver agrees to and verbalizes understanding of need to use nutritional supplements and/or vitamins as  prescribed Date Initiated: 10/05/2022 Target Resolution Date: 12/02/2022 Goal Status: Active Patient/caregiver will maintain therapeutic glucose control Date Initiated: 10/05/2022 Target Resolution Date: 12/02/2022 Goal Status: Active Interventions: Provide education on nutrition Notes: Electronic Signature(s) Signed: 10/05/2022 12:14:04 PM By: Karie Schwalbe RN Entered By: Karie Schwalbe on 10/05/2022 11:44:10 -------------------------------------------------------------------------------- Pain Assessment Details Patient Name: Date of Service: Shelby Walker, Shelby Walker 10/05/2022 10:15 A M Medical Record Number: [id number] Patient Account Number: [id number] Date of Birth/Sex: Treating RN: 1933/10/05 (89 y.o. [name] Primary Care : Adrian Prince Other Clinician: Referring : Treating /Extender: Ignatius Specking in Treatment: 16 Active Problems Location of Pain Severity and Description of Pain Patient Has Paino No Site Locations Pain Management and Medication Current Pain Management: Electronic Signature(s) Signed: 10/05/2022 12:14:04 PM By: Karie Schwalbe RN Entered By: Karie Schwalbe on 10/05/2022 10:38:46 Shelby Walker ([id number]) [id number] Page 6 of 8 -------------------------------------------------------------------------------- Patient/Caregiver Education Details Patient Name: Date of Service: Shelby Walker, Shelby Walker 8/2/2024andnbsp10:15 A M Medical Record Number: [id number] Patient Account Number: [id number] Date of Birth/Gender: Treating RN: 01/21/1934 (89 y.o. [name] Primary Care Physician: Adrian Prince Other Clinician: Referring Physician: Treating Physician/Extender: Ignatius Specking in Treatment: 16 Education Assessment Education Provided To: Patient Education Topics Provided Wound/Skin Impairment: Methods: Explain/Verbal Responses: Return demonstration  correctly [employer]) Signed: 10/05/2022 12:14:04 PM By: Karie Schwalbe RN Entered By: Karie Schwalbe on 10/05/2022 11:44:41 -------------------------------------------------------------------------------- Wound Assessment Details Patient Name: Date of Service: Shelby Walker, Shelby Walker 10/05/2022 10:15 A M Medical Record Number: [id number] Patient Account Number: [id number] Date of Birth/Sex: Treating RN: February 29, 1934 (89 y.o. [name] Primary Care : Adrian Prince Other Clinician: Referring : Treating /Extender: Ignatius Specking in Treatment: 16 Wound Status Wound Number: 2 Primary Etiology: Pressure Ulcer Wound Location: Right Gluteus Wound Status: Open Wounding Event: Gradually Appeared Comorbid History: Type II Diabetes, Osteoarthritis Date Acquired: 09/28/2022 Weeks Of Treatment: 0 Clustered Wound: No Photos Wound Measurements Length: (cm) 0. Width: (cm) 0. Shelby Walker, Shelby Walker ([id number]) Depth: (cm) 0 Area: (cm) Volume: (cm) 7 % Reduction in Area: 6 % Reduction in Volume: [id number] Page 7 of 8 .1 Epithelialization: Small (1-33%) 0.33 Tunneling: No 0.033 Undermining: No Wound Description Classification: Category/Stage II Exudate Amount: Medium Exudate Type: Serosanguineous Exudate Color: red, brown Foul Odor After Cleansing: No Slough/Fibrino Yes Wound Bed Granulation Amount: Medium (34-66%) Exposed Structure Granulation Quality: Pink Fat Layer (Subcutaneous Tissue) Exposed: Yes Necrotic Amount: Medium (34-66%) Necrotic Quality: Eschar, Adherent Slough Periwound Skin  Texture Texture Color No Abnormalities Noted: Yes No Abnormalities Noted: No Moisture Temperature / Pain No Abnormalities Noted: Yes Temperature: No Abnormality Treatment Notes Wound #2 (Gluteus) Wound Laterality: Right Cleanser Soap and Water Discharge Instruction: May shower and wash wound with dial antibacterial  soap and water prior to dressing change. Wound Cleanser Discharge Instruction: Cleanse the wound with wound cleanser prior to applying a clean dressing using gauze sponges, not tissue or cotton balls. Peri-Wound Care Skin Prep Discharge Instruction: Use skin prep as directed Topical Primary Dressing Hydrofera Blue Ready Transfer Foam, 2.5x2.5 (in/in) Discharge Instruction: Apply directly to wound bed as directed MediHoney Gel, tube 1.5 (oz) Discharge Instruction: Apply to wound bed as instructed Secondary Dressing Zetuvit Plus Silicone Border Dressing 4x4 (in/in) Discharge Instruction: Apply silicone border over primary dressing as directed. Secured With Compression Wrap Compression Stockings Facilities manager) Signed: 10/05/2022 12:14:04 PM By: Karie Schwalbe RN Entered By: Karie Schwalbe on 10/05/2022 10:52:16 -------------------------------------------------------------------------------- Vitals Details Patient Name: Date of Service: Shelby Rings. 10/05/2022 10:15 A M Medical Record Number: [id number] Patient Account Number: [id number] Date of Birth/Sex: Treating RN: 11/16/1933 (89 y.o. [name] Primary Care : Adrian Prince Other Clinician: Despina Walker ([id number]) [id number] Page 8 of 8 Referring : Treating /Extender: Ignatius Specking in Treatment: 16 Vital Signs Time Taken: 10:38 Temperature (F): 98.2 Height (in): 61 Pulse (bpm): 73 Weight (lbs): 110 Respiratory Rate (breaths/min): 16 Body Mass Index (BMI): 20.8 Blood Pressure (mmHg): 146/74 Capillary Blood Glucose (mg/dl): 875 Reference Range: 80 - 120 mg / dl Electronic Signature(s) Signed: 10/05/2022 12:14:04 PM By: Karie Schwalbe RN Entered By: Karie Schwalbe on 10/05/2022 10:40:20

## 2022-10-08 DIAGNOSIS — M5416 Radiculopathy, lumbar region: Secondary | ICD-10-CM | POA: Diagnosis not present

## 2022-10-08 DIAGNOSIS — M25561 Pain in right knee: Secondary | ICD-10-CM | POA: Diagnosis not present

## 2022-10-08 DIAGNOSIS — M25562 Pain in left knee: Secondary | ICD-10-CM | POA: Diagnosis not present

## 2022-10-10 DIAGNOSIS — N1832 Chronic kidney disease, stage 3b: Secondary | ICD-10-CM | POA: Diagnosis not present

## 2022-10-10 DIAGNOSIS — E1165 Type 2 diabetes mellitus with hyperglycemia: Secondary | ICD-10-CM | POA: Diagnosis not present

## 2022-10-10 DIAGNOSIS — I509 Heart failure, unspecified: Secondary | ICD-10-CM | POA: Diagnosis not present

## 2022-10-10 DIAGNOSIS — Z48815 Encounter for surgical aftercare following surgery on the digestive system: Secondary | ICD-10-CM | POA: Diagnosis not present

## 2022-10-10 DIAGNOSIS — E1122 Type 2 diabetes mellitus with diabetic chronic kidney disease: Secondary | ICD-10-CM | POA: Diagnosis not present

## 2022-10-10 DIAGNOSIS — I130 Hypertensive heart and chronic kidney disease with heart failure and stage 1 through stage 4 chronic kidney disease, or unspecified chronic kidney disease: Secondary | ICD-10-CM | POA: Diagnosis not present

## 2022-10-11 ENCOUNTER — Ambulatory Visit (HOSPITAL_BASED_OUTPATIENT_CLINIC_OR_DEPARTMENT_OTHER): Payer: Medicare Other | Admitting: Internal Medicine

## 2022-10-12 DIAGNOSIS — Z48815 Encounter for surgical aftercare following surgery on the digestive system: Secondary | ICD-10-CM | POA: Diagnosis not present

## 2022-10-12 DIAGNOSIS — I130 Hypertensive heart and chronic kidney disease with heart failure and stage 1 through stage 4 chronic kidney disease, or unspecified chronic kidney disease: Secondary | ICD-10-CM | POA: Diagnosis not present

## 2022-10-12 DIAGNOSIS — E1165 Type 2 diabetes mellitus with hyperglycemia: Secondary | ICD-10-CM | POA: Diagnosis not present

## 2022-10-12 DIAGNOSIS — I509 Heart failure, unspecified: Secondary | ICD-10-CM | POA: Diagnosis not present

## 2022-10-12 DIAGNOSIS — E1122 Type 2 diabetes mellitus with diabetic chronic kidney disease: Secondary | ICD-10-CM | POA: Diagnosis not present

## 2022-10-12 DIAGNOSIS — N1832 Chronic kidney disease, stage 3b: Secondary | ICD-10-CM | POA: Diagnosis not present

## 2022-10-14 DIAGNOSIS — E785 Hyperlipidemia, unspecified: Secondary | ICD-10-CM | POA: Diagnosis not present

## 2022-10-14 DIAGNOSIS — I890 Lymphedema, not elsewhere classified: Secondary | ICD-10-CM | POA: Diagnosis not present

## 2022-10-14 DIAGNOSIS — L89891 Pressure ulcer of other site, stage 1: Secondary | ICD-10-CM | POA: Diagnosis not present

## 2022-10-14 DIAGNOSIS — Z794 Long term (current) use of insulin: Secondary | ICD-10-CM | POA: Diagnosis not present

## 2022-10-14 DIAGNOSIS — E1165 Type 2 diabetes mellitus with hyperglycemia: Secondary | ICD-10-CM | POA: Diagnosis not present

## 2022-10-14 DIAGNOSIS — I051 Rheumatic mitral insufficiency: Secondary | ICD-10-CM | POA: Diagnosis not present

## 2022-10-14 DIAGNOSIS — K219 Gastro-esophageal reflux disease without esophagitis: Secondary | ICD-10-CM | POA: Diagnosis not present

## 2022-10-14 DIAGNOSIS — M069 Rheumatoid arthritis, unspecified: Secondary | ICD-10-CM | POA: Diagnosis not present

## 2022-10-14 DIAGNOSIS — L89322 Pressure ulcer of left buttock, stage 2: Secondary | ICD-10-CM | POA: Diagnosis not present

## 2022-10-14 DIAGNOSIS — M47816 Spondylosis without myelopathy or radiculopathy, lumbar region: Secondary | ICD-10-CM | POA: Diagnosis not present

## 2022-10-14 DIAGNOSIS — M1711 Unilateral primary osteoarthritis, right knee: Secondary | ICD-10-CM | POA: Diagnosis not present

## 2022-10-14 DIAGNOSIS — E1151 Type 2 diabetes mellitus with diabetic peripheral angiopathy without gangrene: Secondary | ICD-10-CM | POA: Diagnosis not present

## 2022-10-14 DIAGNOSIS — E875 Hyperkalemia: Secondary | ICD-10-CM | POA: Diagnosis not present

## 2022-10-14 DIAGNOSIS — L409 Psoriasis, unspecified: Secondary | ICD-10-CM | POA: Diagnosis not present

## 2022-10-14 DIAGNOSIS — N1832 Chronic kidney disease, stage 3b: Secondary | ICD-10-CM | POA: Diagnosis not present

## 2022-10-14 DIAGNOSIS — I050 Rheumatic mitral stenosis: Secondary | ICD-10-CM | POA: Diagnosis not present

## 2022-10-14 DIAGNOSIS — K5730 Diverticulosis of large intestine without perforation or abscess without bleeding: Secondary | ICD-10-CM | POA: Diagnosis not present

## 2022-10-14 DIAGNOSIS — M810 Age-related osteoporosis without current pathological fracture: Secondary | ICD-10-CM | POA: Diagnosis not present

## 2022-10-14 DIAGNOSIS — I480 Paroxysmal atrial fibrillation: Secondary | ICD-10-CM | POA: Diagnosis not present

## 2022-10-14 DIAGNOSIS — N281 Cyst of kidney, acquired: Secondary | ICD-10-CM | POA: Diagnosis not present

## 2022-10-14 DIAGNOSIS — I130 Hypertensive heart and chronic kidney disease with heart failure and stage 1 through stage 4 chronic kidney disease, or unspecified chronic kidney disease: Secondary | ICD-10-CM | POA: Diagnosis not present

## 2022-10-14 DIAGNOSIS — I509 Heart failure, unspecified: Secondary | ICD-10-CM | POA: Diagnosis not present

## 2022-10-14 DIAGNOSIS — N179 Acute kidney failure, unspecified: Secondary | ICD-10-CM | POA: Diagnosis not present

## 2022-10-14 DIAGNOSIS — E1122 Type 2 diabetes mellitus with diabetic chronic kidney disease: Secondary | ICD-10-CM | POA: Diagnosis not present

## 2022-10-14 DIAGNOSIS — H9193 Unspecified hearing loss, bilateral: Secondary | ICD-10-CM | POA: Diagnosis not present

## 2022-10-15 ENCOUNTER — Ambulatory Visit (INDEPENDENT_AMBULATORY_CARE_PROVIDER_SITE_OTHER): Payer: Medicare Other

## 2022-10-15 DIAGNOSIS — I4819 Other persistent atrial fibrillation: Secondary | ICD-10-CM

## 2022-10-15 LAB — CUP PACEART REMOTE DEVICE CHECK
Date Time Interrogation Session: 20240812123315
Implantable Pulse Generator Implant Date: 20240708

## 2022-10-18 ENCOUNTER — Encounter (HOSPITAL_BASED_OUTPATIENT_CLINIC_OR_DEPARTMENT_OTHER): Payer: Medicare Other | Admitting: Internal Medicine

## 2022-10-18 DIAGNOSIS — L89312 Pressure ulcer of right buttock, stage 2: Secondary | ICD-10-CM

## 2022-10-18 DIAGNOSIS — E11622 Type 2 diabetes mellitus with other skin ulcer: Secondary | ICD-10-CM

## 2022-10-18 DIAGNOSIS — M069 Rheumatoid arthritis, unspecified: Secondary | ICD-10-CM

## 2022-10-19 DIAGNOSIS — I509 Heart failure, unspecified: Secondary | ICD-10-CM | POA: Diagnosis not present

## 2022-10-19 DIAGNOSIS — E1122 Type 2 diabetes mellitus with diabetic chronic kidney disease: Secondary | ICD-10-CM | POA: Diagnosis not present

## 2022-10-19 DIAGNOSIS — I130 Hypertensive heart and chronic kidney disease with heart failure and stage 1 through stage 4 chronic kidney disease, or unspecified chronic kidney disease: Secondary | ICD-10-CM | POA: Diagnosis not present

## 2022-10-19 DIAGNOSIS — L89322 Pressure ulcer of left buttock, stage 2: Secondary | ICD-10-CM | POA: Diagnosis not present

## 2022-10-19 DIAGNOSIS — L89891 Pressure ulcer of other site, stage 1: Secondary | ICD-10-CM | POA: Diagnosis not present

## 2022-10-19 DIAGNOSIS — M1711 Unilateral primary osteoarthritis, right knee: Secondary | ICD-10-CM | POA: Diagnosis not present

## 2022-10-19 NOTE — Progress Notes (Signed)
CHAELI, VILLAROSA ([id number]) [id number] Page 1 of 6 Visit Report for 10/18/2022 Chief Complaint Document Details Patient Name: Date of Service: Shelby, Walker 10/18/2022 3:45 PM Medical Record Number: [id number] Patient Account Number: [id number] Date of Birth/Sex: Treating RN: [birth year] (89 y.o. F) Primary Care Provider: Adrian Prince Other Clinician: Referring Provider: Treating Provider/Extender: Ignatius Specking in Treatment: 18 Information Obtained from: Patient Chief Complaint 06/01/2022; previous history of sacral ulcer and bilateral feet wounds. 10/05/2022; right buttocks wound Electronic Signature(s) Signed: 10/18/2022 4:25:59 PM By: Geralyn Corwin DO Entered By: Geralyn Corwin on 10/18/2022 16:11:07 -------------------------------------------------------------------------------- HPI Details Patient Name: Date of Service: Shelby Walker 10/18/2022 3:45 PM Medical Record Number: [id number] Patient Account Number: [id number] Date of Birth/Sex: Treating RN: 28-Aug-1933 (89 y.o. F) Primary Care Provider: Adrian Prince Other Clinician: Referring Provider: Treating Provider/Extender: Ignatius Specking in Treatment: 18 History of Present Illness HPI Description: 06/01/2022 Shelby Walker is an 89 year old female with a past medical history of type 2 diabetes with previous transmetatarsal of the right foot, rheumatoid arthritis that presents to the clinic for concern of wounds to her sacrum and bilateral feet. She was referred by infectious disease and orthopedic surgery for these issues. Daughter is present and helps provide the history. She states that 6 weeks ago she developed pressure injuries and superficial wounds to her feet. She was treated with a long course of antibiotics Due to concern for infection. She has been using Silvadene cream to the areas. She is also been using bunny boots. Over the past several  weeks the wounds have healed. She also developed a sore to her sacrum 2 to 3 weeks ago. She has been keeping the area clean. This area is also healed. She currently denies signs of infection. 4/8; patient was last seen 1 week ago as a consult for her previous left foot wound that was healed. She had a stage I pressure ulcer to her sacrum at that time. Unfortunately this has progressed. She sits for long periods of time in her recliner and couch throughout the day. She is able to ambulate with a walker however it does not appear to be doing very much outside of going to the bathroom and going to the kitchen to eat. She currently denies signs of infection. 4/15; patient presents for follow-up. She has been using Medihoney and Hydrofera Blue to the sacral wound. She has no issues or complaints today. She has tolerated this well. The wound is smaller. 4/29; patient presents for follow-up. She has been using Medihoney and Hydrofera Blue to the sacral wound. It is almost healed. She reports being diligent about her offloading. 5/13; patient presents for follow-up. She has been using Medihoney and Hydrofera Blue to the sacral wound. It is healed today. 10/05/2022 Shelby Walker is an 89 year old female with a past medical history of rheumatoid arthritis, type 2 diabetes that presents to the clinic for a 2-week history of wound to her right buttocks. She has been treated in our clinic for similar wounds in the past. She has been using Medihoney to the wound bed. She has no issues or complaints today. She denies signs of infection. 8/15; patient presents for follow-up. She has been using Medihoney and Hydrofera Blue to the wound bed. The wound is healed. Electronic Signature(s) JAEDON, BARRERA ([id number]) [id number] Page 2 of 6 Signed: 10/18/2022 4:25:59 PM By: Geralyn Corwin DO Entered By: Geralyn Corwin on 10/18/2022  16:12:15 -------------------------------------------------------------------------------- Physical Exam Details Patient Name:  Date of Service: Shelby, Walker 10/18/2022 3:45 PM Medical Record Number: [id number] Patient Account Number: [id number] Date of Birth/Sex: Treating RN: Mar 03, 1934 (88 y.o. F) Primary Care Provider: Adrian Prince Other Clinician: Referring Provider: Treating Provider/Extender: Ignatius Specking in Treatment: 18 Constitutional respirations regular, non-labored and within target range for patient.Marland Kitchen Psychiatric pleasant and cooperative. Notes T the right buttocks there is epithelization to the previous wound site. No surrounding signs of soft tissue infection. o Electronic Signature(s) Signed: 10/18/2022 4:25:59 PM By: Geralyn Corwin DO Entered By: Geralyn Corwin on 10/18/2022 16:12:56 -------------------------------------------------------------------------------- Physician Orders Details Patient Name: Date of Service: Shelby Walker 10/18/2022 3:45 PM Medical Record Number: [id number] Patient Account Number: [id number] Date of Birth/Sex: Treating RN: 1933/04/15 (89 y.o. Arta Silence Primary Care Provider: Adrian Prince Other Clinician: Referring Provider: Treating Provider/Extender: Ignatius Specking in Treatment: 18 Verbal / Phone Orders: No Diagnosis Coding ICD-10 Coding Code Description L89.312 Pressure ulcer of right buttock, stage 2 E11.622 Type 2 diabetes mellitus with other skin ulcer M06.9 Rheumatoid arthritis, unspecified Follow-up Appointments ppointment in 2 weeks. - 215pm 11/01/2022 room8 Dr Mikey Bussing Return A Call if you would remains closed. Non Wound Condition Protect area with: - x1 week bordered foam. Electronic Signature(s) Signed: 10/18/2022 4:25:59 PM By: Geralyn Corwin DO Signed: 10/18/2022 5:38:18 PM By: Shawn Stall RN, BSN Entered By: Shawn Stall on 10/18/2022 16:14:38 Despina Arias ([id number]) [id number] Page 3 of 6 -------------------------------------------------------------------------------- Problem List Details Patient Name: Date of Service: Shelby, Walker 10/18/2022 3:45 PM Medical Record Number: [id number] Patient Account Number: [id number] Date of Birth/Sex: Treating RN: [birth year] (89 y.o. F) Primary Care Provider: Adrian Prince Other Clinician: Referring Provider: Treating Provider/Extender: Ignatius Specking in Treatment: 18 Active Problems ICD-10 Encounter Code Description Active Date MDM Diagnosis L89.312 Pressure ulcer of right buttock, stage 2 10/05/2022 No Yes E11.622 Type 2 diabetes mellitus with other skin ulcer 06/11/2022 No Yes M06.9 Rheumatoid arthritis, unspecified 06/11/2022 No Yes Inactive Problems Resolved Problems ICD-10 Code Description Active Date Resolved Date L89.153 Pressure ulcer of sacral region, stage 3 06/11/2022 06/11/2022 Electronic Signature(s) Signed: 10/18/2022 4:25:59 PM By: Geralyn Corwin DO Entered By: Geralyn Corwin on 10/18/2022 16:10:55 -------------------------------------------------------------------------------- Progress Note Details Patient Name: Date of Service: Shelby Walker 10/18/2022 3:45 PM Medical Record Number: [id number] Patient Account Number: [id number] Date of Birth/Sex: Treating RN: [birth year] (89 y.o. F) Primary Care Provider: Adrian Prince Other Clinician: Referring Provider: Treating Provider/Extender: Ignatius Specking in Treatment: 18 Subjective Chief Complaint Information obtained from Patient 06/01/2022; previous history of sacral ulcer and bilateral feet wounds. 10/05/2022; right buttocks wound History of Present Illness (HPI) 06/01/2022 Shelby Walker is an 89 year old female with a past medical history of type 2 diabetes with previous transmetatarsal of the right foot, rheumatoid arthritis that presents to the  clinic for concern of wounds to her sacrum and bilateral feet. She was referred by infectious disease and orthopedic surgery for these issues. Shelby, Walker ([id number]) [id number] Page 4 of 6 Daughter is present and helps provide the history. She states that 6 weeks ago she developed pressure injuries and superficial wounds to her feet. She was treated with a long course of antibiotics Due to concern for infection. She has been using Silvadene cream to the areas. She is also been using bunny boots. Over the past several weeks the wounds have healed. She also developed a sore to her sacrum 2 to 3 weeks ago. She has  been keeping the area clean. This area is also healed. She currently denies signs of infection. 4/8; patient was last seen 1 week ago as a consult for her previous left foot wound that was healed. She had a stage I pressure ulcer to her sacrum at that time. Unfortunately this has progressed. She sits for long periods of time in her recliner and couch throughout the day. She is able to ambulate with a walker however it does not appear to be doing very much outside of going to the bathroom and going to the kitchen to eat. She currently denies signs of infection. 4/15; patient presents for follow-up. She has been using Medihoney and Hydrofera Blue to the sacral wound. She has no issues or complaints today. She has tolerated this well. The wound is smaller. 4/29; patient presents for follow-up. She has been using Medihoney and Hydrofera Blue to the sacral wound. It is almost healed. She reports being diligent about her offloading. 5/13; patient presents for follow-up. She has been using Medihoney and Hydrofera Blue to the sacral wound. It is healed today. 10/05/2022 Shelby Walker is an 89 year old female with a past medical history of rheumatoid arthritis, type 2 diabetes that presents to the clinic for a 2-week history of wound to her right buttocks. She has been  treated in our clinic for similar wounds in the past. She has been using Medihoney to the wound bed. She has no issues or complaints today. She denies signs of infection. 8/15; patient presents for follow-up. She has been using Medihoney and Hydrofera Blue to the wound bed. The wound is healed. Patient History Information obtained from Patient, Chart. Family History Unknown History. Social History Never smoker, Marital Status - Widowed, Alcohol Use - Never, Drug Use - No History, Caffeine Use - Rarely. Medical History Endocrine Patient has history of Type II Diabetes Musculoskeletal Patient has history of Osteoarthritis Hospitalization/Surgery History - Ppm generator removal. - tee without conversion. - pacemaker implaint. - loop recorder removal. - transmet. amp. right. - abdominal aortogram w/ lower extremity right. - fracture surgery. Medical A Surgical History Notes nd Cardiovascular mitral regurg., bruit, chest pain,unspecified, dyslipidemia, ejection fraction 55-60 % , palpitations Objective Constitutional respirations regular, non-labored and within target range for patient.. Vitals Time Taken: 3:39 PM, Height: 61 in, Weight: 110 lbs, BMI: 20.8, Temperature: 97.7 F, Pulse: 82 bpm, Respiratory Rate: 16 breaths/min, Blood Pressure: 152/68 mmHg, Capillary Blood Glucose: 165 mg/dl. Psychiatric pleasant and cooperative. General Notes: T the right buttocks there is epithelization to the previous wound site. No surrounding signs of soft tissue infection. o Integumentary (Hair, Skin) Wound #2 status is Healed - Epithelialized. Original cause of wound was Gradually Appeared. The date acquired was: 09/28/2022. The wound has been in treatment 1 weeks. The wound is located on the Right Gluteus. The wound measures 0cm length x 0cm width x 0cm depth; 0cm^2 area and 0cm^3 volume. There is Fat Layer (Subcutaneous Tissue) exposed. There is no tunneling or undermining noted. There is a medium  amount of serosanguineous drainage noted. There is no granulation within the wound bed. There is no necrotic tissue within the wound bed. The periwound skin appearance had no abnormalities noted for texture. The periwound skin appearance had no abnormalities noted for moisture. Periwound temperature was noted as No Abnormality. Assessment Active Problems ICD-10 Pressure ulcer of right buttock, stage 2 Type 2 diabetes mellitus with other skin ulcer Rheumatoid arthritis, unspecified Shelby, Walker ([id number]) [id number] Page 5 of 6 Patient has done  well with Hydrofera Blue and Medihoney. Her wound is healed. I did recommend protecting this area daily for the next week. Continue aggressive offloading for the next week. I offered her a follow-up appointment in 2 weeks in case the wound were to reopen. Plan 1. Follow-up in 2 weeks 2. Continue aggressive offloading for the next 1 to 2 weeks 3. Protect the area daily for the next week. Electronic Signature(s) Signed: 10/18/2022 4:25:59 PM By: Geralyn Corwin DO Entered By: Geralyn Corwin on 10/18/2022 16:14:35 -------------------------------------------------------------------------------- HxROS Details Patient Name: Date of Service: Shelby Walker 10/18/2022 3:45 PM Medical Record Number: [id number] Patient Account Number: [id number] Date of Birth/Sex: Treating RN: Apr 25, 1933 (89 y.o. F) Primary Care Provider: Adrian Prince Other Clinician: Referring Provider: Treating Provider/Extender: Ignatius Specking in Treatment: 18 Information Obtained From Patient Chart Cardiovascular Medical History: Past Medical History Notes: mitral regurg., bruit, chest pain,unspecified, dyslipidemia, ejection fraction 55-60 % , palpitations Endocrine Medical History: Positive for: Type II Diabetes Musculoskeletal Medical History: Positive for: Osteoarthritis Immunizations Pneumococcal Vaccine: Received  Pneumococcal Vaccination: No Implantable Devices None Hospitalization / Surgery History Type of Hospitalization/Surgery Ppm generator removal tee without conversion pacemaker implaint loop recorder removal transmet. amp. right abdominal aortogram w/ lower extremity right fracture surgery DOMENICA, SIEMS P ([id number]) [id number] Page 6 of 6 Family and Social History Unknown History: Yes; Never smoker; Marital Status - Widowed; Alcohol Use: Never; Drug Use: No History; Caffeine Use: Rarely; Financial Concerns: No; Food, Clothing or Shelter Needs: No; Support System Lacking: No; Transportation Concerns: No Electronic Signature(s) Signed: 10/18/2022 4:25:59 PM By: Geralyn Corwin DO Entered By: Geralyn Corwin on 10/18/2022 16:12:21 -------------------------------------------------------------------------------- SuperBill Details Patient Name: Date of Service: Shelby Walker 10/18/2022 Medical Record Number: [id number] Patient Account Number: [id number] Date of Birth/Sex: Treating RN: 12-02-1933 (89 y.o. F) Primary Care Provider: Adrian Prince Other Clinician: Referring Provider: Treating Provider/Extender: Ignatius Specking in Treatment: 18 Diagnosis Coding ICD-10 Codes Code Description [phone] Pressure ulcer of right buttock, stage 2 E11.622 Type 2 diabetes mellitus with other skin ulcer M06.9 Rheumatoid arthritis, unspecified Facility Procedures : CPT4 Code: [id number] Description: 99213 - WOUND CARE VISIT-LEV 3 EST PT Modifier: Quantity: 1 Physician Procedures : CPT4 Code Description Modifier [id number] 99213 - WC PHYS LEVEL 3 - EST PT ICD-10 Diagnosis Description L89.312 Pressure ulcer of right buttock, stage 2 E11.622 Type 2 diabetes mellitus with other skin ulcer M06.9 Rheumatoid arthritis, unspecified Quantity: 1 Electronic Signature(s) Signed: 10/18/2022 4:25:59 PM By: Geralyn Corwin DO Signed: 10/18/2022 5:38:18 PM By:  Shawn Stall RN, BSN Entered By: Shawn Stall on 10/18/2022 16:17:48

## 2022-10-19 NOTE — Progress Notes (Signed)
Shelby Walker, Shelby Walker ([id number]) [id number] Page 1 of 7 Visit Report for 10/18/2022 Arrival Information Details Patient Name: Date of Service: PHALON, KATSAROS 10/18/2022 3:45 PM Medical Record Number: [id number] Patient Account Number: [id number] Date of Birth/Sex: Treating RN: 09/02/1933 (89 y.o. F) Primary Care Jerris Fleer: Adrian Prince Other Clinician: Referring Tyner Codner: Treating Elaura Calix/Extender: Ignatius Specking in Treatment: 18 Visit Information History Since Last Visit Added or deleted any medications: No Patient Arrived: [name] Any new allergies or adverse reactions: No Arrival Time: 15:36 Had a fall or experienced change in No Accompanied By: granddaughter activities of daily living that may affect Transfer Assistance: None risk of falls: Patient Identification Verified: Yes Signs or symptoms of abuse/neglect since last visito No Secondary Verification Process Completed: Yes Hospitalized since last visit: No Patient Requires Transmission-Based Precautions: No Implantable device outside of the clinic excluding No Patient Has Alerts: No cellular tissue based products placed in the center since last visit: Has Dressing in Place as Prescribed: Yes Pain Present Now: No Electronic Signature(s) Signed: 10/19/2022 12:35:38 PM By: Thayer Dallas Entered By: Thayer Dallas on 10/18/2022 15:37:10 -------------------------------------------------------------------------------- Clinic Level of Care Assessment Details Patient Name: Date of Service: Shelby Walker, Shelby Walker 10/18/2022 3:45 PM Medical Record Number: [id number] Patient Account Number: [id number] Date of Birth/Sex: Treating RN: [birth year] (89 y.o. Arta Silence Primary Care Shealynn Saulnier: Adrian Prince Other Clinician: Referring Lavoy Bernards: Treating Laqueisha Catalina/Extender: Ignatius Specking in Treatment: 18 Clinic Level of Care Assessment Items TOOL 4 Quantity Score X- 1  0 Use when only an EandM is performed on FOLLOW-UP visit ASSESSMENTS - Nursing Assessment / Reassessment X- 1 10 Reassessment of Co-morbidities (includes updates in patient status) X- 1 5 Reassessment of Adherence to Treatment Plan ASSESSMENTS - Wound and Skin A ssessment / Reassessment X - Simple Wound Assessment / Reassessment - one wound 1 5 []  - 0 Complex Wound Assessment / Reassessment - multiple wounds []  - 0 Dermatologic / Skin Assessment (not related to wound area) ASSESSMENTS - Focused Assessment []  - 0 Circumferential Edema Measurements - multi extremities []  - 0 Nutritional Assessment / Counseling / Intervention Shelby Walker, Shelby Walker ([id number]) [id number] Page 2 of 7 []  - 0 Lower Extremity Assessment (monofilament, tuning fork, pulses) []  - 0 Peripheral Arterial Disease Assessment (using hand held doppler) ASSESSMENTS - Ostomy and/or Continence Assessment and Care []  - 0 Incontinence Assessment and Management []  - 0 Ostomy Care Assessment and Management (repouching, etc.) PROCESS - Coordination of Care X - Simple Patient / Family Education for ongoing care 1 15 []  - 0 Complex (extensive) Patient / Family Education for ongoing care X- 1 10 Staff obtains Chiropractor, Records, T Results / Process Orders est []  - 0 Staff telephones HHA, Nursing Homes / Clarify orders / etc []  - 0 Routine Transfer to another Facility (non-emergent condition) []  - 0 Routine Hospital Admission (non-emergent condition) []  - 0 New Admissions / Manufacturing engineer / Ordering NPWT Apligraf, etc. , []  - 0 Emergency Hospital Admission (emergent condition) X- 1 10 Simple Discharge Coordination []  - 0 Complex (extensive) Discharge Coordination PROCESS - Special Needs []  - 0 Pediatric / Minor Patient Management []  - 0 Isolation Patient Management []  - 0 Hearing / Language / Visual special needs []  - 0 Assessment of Community assistance (transportation, D/C  planning, etc.) []  - 0 Additional assistance / Altered mentation []  - 0 Support Surface(s) Assessment (bed, cushion, seat, etc.) INTERVENTIONS - Wound Cleansing / Measurement X - Simple Wound Cleansing - one wound 1  5 []  - 0 Complex Wound Cleansing - multiple wounds X- 1 5 Wound Imaging (photographs - any number of wounds) []  - 0 Wound Tracing (instead of photographs) X- 1 5 Simple Wound Measurement - one wound []  - 0 Complex Wound Measurement - multiple wounds INTERVENTIONS - Wound Dressings X - Small Wound Dressing one or multiple wounds 1 10 []  - 0 Medium Wound Dressing one or multiple wounds []  - 0 Large Wound Dressing one or multiple wounds []  - 0 Application of Medications - topical []  - 0 Application of Medications - injection INTERVENTIONS - Miscellaneous []  - 0 External ear exam []  - 0 Specimen Collection (cultures, biopsies, blood, body fluids, etc.) []  - 0 Specimen(s) / Culture(s) sent or taken to Lab for analysis []  - 0 Patient Transfer (multiple staff / Nurse, adult / Similar devices) []  - 0 Simple Staple / Suture removal (25 or less) []  - 0 Complex Staple / Suture removal (26 or more) []  - 0 Hypo / Hyperglycemic Management (close monitor of Blood Glucose) Shelby Walker, Shelby Walker ([id number]) [id number] Page 3 of 7 []  - 0 Ankle / Brachial Index (ABI) - do not check if billed separately X- 1 5 Vital Signs Has the patient been seen at the hospital within the last three years: Yes Total Score: 85 Level Of Care: New/Established - Level 3 Electronic Signature(s) Signed: 10/18/2022 5:38:18 PM By: Shawn Stall RN, BSN Entered By: Shawn Stall on 10/18/2022 16:17:43 -------------------------------------------------------------------------------- Encounter Discharge Information Details Patient Name: Date of Service: Shelby Walker 10/18/2022 3:45 PM Medical Record Number: [id number] Patient Account Number: [id number] Date of Birth/Sex:  Treating RN: [birth year] (89 y.o. Arta Silence Primary Care Suhaas Agena: Adrian Prince Other Clinician: Referring Zayah Keilman: Treating Shantell Belongia/Extender: Ignatius Specking in Treatment: 18 Encounter Discharge Information Items Discharge Condition: Stable Ambulatory Status: Walker Discharge Destination: Home Transportation: Private Auto Accompanied By: niece Schedule Follow-up Appointment: Yes Clinical Summary of Care: Electronic Signature(s) Signed: 10/18/2022 5:38:18 PM By: Shawn Stall RN, BSN Entered By: Shawn Stall on 10/18/2022 16:18:21 -------------------------------------------------------------------------------- Lower Extremity Assessment Details Patient Name: Date of Service: Shelby Walker, Shelby Walker 10/18/2022 3:45 PM Medical Record Number: [id number] Patient Account Number: [id number] Date of Birth/Sex: Treating RN: 1933/08/14 (89 y.o. F) Primary Care Kelleen Stolze: Adrian Prince Other Clinician: Referring Loren Sawaya: Treating Maryori Weide/Extender: Ignatius Specking in Treatment: 18 Electronic Signature(s) Signed: 10/19/2022 12:35:38 PM By: Thayer Dallas Entered By: Thayer Dallas on 10/18/2022 15:40:13 -------------------------------------------------------------------------------- Multi Wound Chart Details Patient Name: Date of Service: Shelby Walker 10/18/2022 3:45 PM Medical Record Number: [id number] Patient Account Number: [id number] AUJANAE, KANALEY ([id number]) [id number] Page 4 of 7 Date of Birth/Sex: Treating RN: [birth year] (89 y.o. F) Primary Care Margarete Horace: Other Clinician: Adrian Prince Referring Rico Massar: Treating Dejan Angert/Extender: Ignatius Specking in Treatment: 18 Vital Signs Height(in): 61 Capillary Blood Glucose(mg/dl): 295 Weight(lbs): 188 Pulse(bpm): 82 Body Mass Index(BMI): 20.8 Blood Pressure(mmHg): 152/68 Temperature(F): 97.7 Respiratory Rate(breaths/min):  16 [2:Photos:] [N/A:N/A] Right Gluteus N/A N/A Wound Location: Gradually Appeared N/A N/A Wounding Event: Pressure Ulcer N/A N/A Primary Etiology: Type II Diabetes, Osteoarthritis N/A N/A Comorbid History: 09/28/2022 N/A N/A Date Acquired: 1 N/A N/A Weeks of Treatment: Open N/A N/A Wound Status: No N/A N/A Wound Recurrence: 0.1x0.1x0.1 N/A N/A Measurements L x W x D (cm) 0.008 N/A N/A A (cm) : rea 0.001 N/A N/A Volume (cm) : 97.60% N/A N/A % Reduction in A rea: 97.00% N/A N/A % Reduction in Volume: Category/Stage II N/A N/A Classification: Medium N/A  N/A Exudate A mount: Serosanguineous N/A N/A Exudate Type: red, brown N/A N/A Exudate Color: None Present (0%) N/A N/A Granulation A mount: None Present (0%) N/A N/A Necrotic A mount: Fat Layer (Subcutaneous Tissue): Yes N/A N/A Exposed Structures: Large (67-100%) N/A N/A Epithelialization: No Abnormalities Noted N/A N/A Periwound Skin Texture: No Abnormalities Noted N/A N/A Periwound Skin Moisture: No Abnormality N/A N/A Temperature: Treatment Notes Electronic Signature(s) Signed: 10/18/2022 4:25:59 PM By: Geralyn Corwin DO Entered By: Geralyn Corwin on 10/18/2022 16:10:59 -------------------------------------------------------------------------------- Multi-Disciplinary Care Plan Details Patient Name: Date of Service: Shelby Walker 10/18/2022 3:45 PM Medical Record Number: [id number] Patient Account Number: [id number] Date of Birth/Sex: Treating RN: [birth year] (89 y.o. Arta Silence Primary Care Roxana Lai: Adrian Prince Other Clinician: Referring Crystale Giannattasio: Treating Toure Edmonds/Extender: Ignatius Specking in Treatment: [address] Ray, Algernon Huxley ([id number]) [id number] Page 5 of 7 Electronic Signature(s) Signed: 10/18/2022 5:38:18 PM By: Shawn Stall RN, BSN Entered By: Shawn Stall on 10/18/2022  16:17:20 -------------------------------------------------------------------------------- Pain Assessment Details Patient Name: Date of Service: Shelby Walker, Shelby Walker 10/18/2022 3:45 PM Medical Record Number: [id number] Patient Account Number: [id number] Date of Birth/Sex: Treating RN: 04-Sep-1933 (89 y.o. F) Primary Care Jordanne Elsbury: Adrian Prince Other Clinician: Referring Mariadel Mruk: Treating Samarah Hogle/Extender: Ignatius Specking in Treatment: 18 Active Problems Location of Pain Severity and Description of Pain Patient Has Paino No Site Locations Pain Management and Medication Current Pain Management: Electronic Signature(s) Signed: 10/19/2022 12:35:38 PM By: Thayer Dallas Entered By: Thayer Dallas on 10/18/2022 15:40:00 -------------------------------------------------------------------------------- Patient/Caregiver Education Details Patient Name: Date of Service: Shelby Walker 8/15/2024andnbsp3:45 PM Medical Record Number: [id number] Patient Account Number: [id number] Date of Birth/Gender: Treating RN: 1933/10/21 (89 y.o. [name] Primary Care Physician: Adrian Prince Other Clinician: Referring Physician: Treating Physician/Extender: Ignatius Specking in Treatment: 18 Education Assessment Education Provided To: Patient Shelby Walker, Shelby Walker ([id number]) [id number] Page 6 of 7 Education Topics Provided Wound/Skin Impairment: Methods: Explain/Verbal Responses: See progress note Electronic Signature(s) Signed: 10/18/2022 5:28:50 PM By: Karie Schwalbe RN Entered By: Karie Schwalbe on 10/18/2022 16:58:25 -------------------------------------------------------------------------------- Wound Assessment Details Patient Name: Date of Service: Shelby Walker, Shelby Walker 10/18/2022 3:45 PM Medical Record Number: [id number] Patient Account Number: [id number] Date of Birth/Sex: Treating RN: 1933-08-28 (89 y.o. Debara Pickett, Yvonne Kendall Primary  Care Deleah Tison: Adrian Prince Other Clinician: Referring Shakedra Beam: Treating Jakub Debold/Extender: Ignatius Specking in Treatment: 18 Wound Status Wound Number: 2 Primary Etiology: Pressure Ulcer Wound Location: Right Gluteus Wound Status: Healed - Epithelialized Wounding Event: Gradually Appeared Comorbid History: Type II Diabetes, Osteoarthritis Date Acquired: 09/28/2022 Weeks Of Treatment: 1 Clustered Wound: No Photos Wound Measurements Length: (cm) Width: (cm) Depth: (cm) Area: (cm) Volume: (cm) 0 % Reduction in Area: 100% 0 % Reduction in Volume: 100% 0 Epithelialization: Large (67-100%) 0 Tunneling: No 0 Undermining: No Wound Description Classification: Category/Stage II Exudate Amount: Medium Exudate Type: Serosanguineous Exudate Color: red, brown Foul Odor After Cleansing: No Slough/Fibrino Yes Wound Bed Granulation Amount: None Present (0%) Exposed Structure Necrotic Amount: None Present (0%) Fat Layer (Subcutaneous Tissue) Exposed: Yes Periwound Skin Texture Texture Color No Abnormalities Noted: Yes No Abnormalities Noted: No Moisture Temperature / Pain No Abnormalities Noted: Yes Temperature: No Abnormality Shelby Walker, Shelby Walker ([id number]) [id number] Page 7 of 7 Treatment Notes Wound #2 (Gluteus) Wound Laterality: Right Cleanser Peri-Wound Care Topical Primary Dressing Secondary Dressing Secured With Compression Wrap Compression Stockings Add-Ons Notes bordered foam Electronic Signature(s) Signed: 10/18/2022 5:38:18 PM By: Shawn Stall RN, BSN Entered By: Elesa Hacker,  Bobbi on 10/18/2022 16:11:28 -------------------------------------------------------------------------------- Vitals Details Patient Name: Date of Service: Shelby Walker, Shelby Walker 10/18/2022 3:45 PM Medical Record Number: [id number] Patient Account Number: [id number] Date of Birth/Sex: Treating RN: Nov 04, 1933 (89 y.o. F) Primary Care Amiri Tritch: Adrian Prince Other Clinician: Referring Milea Klink: Treating Dung Prien/Extender: Ignatius Specking in Treatment: 18 Vital Signs Time Taken: 15:39 Temperature (F): 97.7 Height (in): 61 Pulse (bpm): 82 Weight (lbs): 110 Respiratory Rate (breaths/min): 16 Body Mass Index (BMI): 20.8 Blood Pressure (mmHg): 152/68 Capillary Blood Glucose (mg/dl): 119 Reference Range: 80 - 120 mg / dl Electronic Signature(s) Signed: 10/19/2022 12:35:38 PM By: Thayer Dallas Entered By: Thayer Dallas on 10/18/2022 15:39:37

## 2022-10-24 DIAGNOSIS — L89322 Pressure ulcer of left buttock, stage 2: Secondary | ICD-10-CM | POA: Diagnosis not present

## 2022-10-24 DIAGNOSIS — L89891 Pressure ulcer of other site, stage 1: Secondary | ICD-10-CM | POA: Diagnosis not present

## 2022-10-24 DIAGNOSIS — I130 Hypertensive heart and chronic kidney disease with heart failure and stage 1 through stage 4 chronic kidney disease, or unspecified chronic kidney disease: Secondary | ICD-10-CM | POA: Diagnosis not present

## 2022-10-24 DIAGNOSIS — I509 Heart failure, unspecified: Secondary | ICD-10-CM | POA: Diagnosis not present

## 2022-10-24 DIAGNOSIS — M1711 Unilateral primary osteoarthritis, right knee: Secondary | ICD-10-CM | POA: Diagnosis not present

## 2022-10-24 DIAGNOSIS — E1122 Type 2 diabetes mellitus with diabetic chronic kidney disease: Secondary | ICD-10-CM | POA: Diagnosis not present

## 2022-10-25 ENCOUNTER — Encounter (HOSPITAL_BASED_OUTPATIENT_CLINIC_OR_DEPARTMENT_OTHER): Payer: Medicare Other | Admitting: Internal Medicine

## 2022-10-29 DIAGNOSIS — I130 Hypertensive heart and chronic kidney disease with heart failure and stage 1 through stage 4 chronic kidney disease, or unspecified chronic kidney disease: Secondary | ICD-10-CM | POA: Diagnosis not present

## 2022-10-29 DIAGNOSIS — M1711 Unilateral primary osteoarthritis, right knee: Secondary | ICD-10-CM | POA: Diagnosis not present

## 2022-10-29 DIAGNOSIS — L89322 Pressure ulcer of left buttock, stage 2: Secondary | ICD-10-CM | POA: Diagnosis not present

## 2022-10-29 DIAGNOSIS — I509 Heart failure, unspecified: Secondary | ICD-10-CM | POA: Diagnosis not present

## 2022-10-29 DIAGNOSIS — E1165 Type 2 diabetes mellitus with hyperglycemia: Secondary | ICD-10-CM | POA: Diagnosis not present

## 2022-10-29 DIAGNOSIS — Z794 Long term (current) use of insulin: Secondary | ICD-10-CM | POA: Diagnosis not present

## 2022-10-29 DIAGNOSIS — E1122 Type 2 diabetes mellitus with diabetic chronic kidney disease: Secondary | ICD-10-CM | POA: Diagnosis not present

## 2022-10-29 DIAGNOSIS — I480 Paroxysmal atrial fibrillation: Secondary | ICD-10-CM | POA: Diagnosis not present

## 2022-10-29 DIAGNOSIS — L89891 Pressure ulcer of other site, stage 1: Secondary | ICD-10-CM | POA: Diagnosis not present

## 2022-10-29 DIAGNOSIS — N179 Acute kidney failure, unspecified: Secondary | ICD-10-CM | POA: Diagnosis not present

## 2022-10-29 DIAGNOSIS — E1151 Type 2 diabetes mellitus with diabetic peripheral angiopathy without gangrene: Secondary | ICD-10-CM | POA: Diagnosis not present

## 2022-10-29 DIAGNOSIS — N1832 Chronic kidney disease, stage 3b: Secondary | ICD-10-CM | POA: Diagnosis not present

## 2022-10-29 NOTE — Progress Notes (Signed)
Carelink Summary Report / Loop Recorder 

## 2022-10-31 DIAGNOSIS — M1711 Unilateral primary osteoarthritis, right knee: Secondary | ICD-10-CM | POA: Diagnosis not present

## 2022-10-31 DIAGNOSIS — I509 Heart failure, unspecified: Secondary | ICD-10-CM | POA: Diagnosis not present

## 2022-10-31 DIAGNOSIS — L89322 Pressure ulcer of left buttock, stage 2: Secondary | ICD-10-CM | POA: Diagnosis not present

## 2022-10-31 DIAGNOSIS — L89891 Pressure ulcer of other site, stage 1: Secondary | ICD-10-CM | POA: Diagnosis not present

## 2022-10-31 DIAGNOSIS — E1122 Type 2 diabetes mellitus with diabetic chronic kidney disease: Secondary | ICD-10-CM | POA: Diagnosis not present

## 2022-10-31 DIAGNOSIS — I130 Hypertensive heart and chronic kidney disease with heart failure and stage 1 through stage 4 chronic kidney disease, or unspecified chronic kidney disease: Secondary | ICD-10-CM | POA: Diagnosis not present

## 2022-11-01 ENCOUNTER — Ambulatory Visit (HOSPITAL_BASED_OUTPATIENT_CLINIC_OR_DEPARTMENT_OTHER): Payer: Medicare Other | Admitting: Internal Medicine

## 2022-11-07 DIAGNOSIS — I130 Hypertensive heart and chronic kidney disease with heart failure and stage 1 through stage 4 chronic kidney disease, or unspecified chronic kidney disease: Secondary | ICD-10-CM | POA: Diagnosis not present

## 2022-11-07 DIAGNOSIS — I509 Heart failure, unspecified: Secondary | ICD-10-CM | POA: Diagnosis not present

## 2022-11-07 DIAGNOSIS — L89322 Pressure ulcer of left buttock, stage 2: Secondary | ICD-10-CM | POA: Diagnosis not present

## 2022-11-07 DIAGNOSIS — E1122 Type 2 diabetes mellitus with diabetic chronic kidney disease: Secondary | ICD-10-CM | POA: Diagnosis not present

## 2022-11-07 DIAGNOSIS — L89891 Pressure ulcer of other site, stage 1: Secondary | ICD-10-CM | POA: Diagnosis not present

## 2022-11-07 DIAGNOSIS — M1711 Unilateral primary osteoarthritis, right knee: Secondary | ICD-10-CM | POA: Diagnosis not present

## 2022-11-13 DIAGNOSIS — I130 Hypertensive heart and chronic kidney disease with heart failure and stage 1 through stage 4 chronic kidney disease, or unspecified chronic kidney disease: Secondary | ICD-10-CM | POA: Diagnosis not present

## 2022-11-13 DIAGNOSIS — N1832 Chronic kidney disease, stage 3b: Secondary | ICD-10-CM | POA: Diagnosis not present

## 2022-11-13 DIAGNOSIS — I890 Lymphedema, not elsewhere classified: Secondary | ICD-10-CM | POA: Diagnosis not present

## 2022-11-13 DIAGNOSIS — M810 Age-related osteoporosis without current pathological fracture: Secondary | ICD-10-CM | POA: Diagnosis not present

## 2022-11-13 DIAGNOSIS — N281 Cyst of kidney, acquired: Secondary | ICD-10-CM | POA: Diagnosis not present

## 2022-11-13 DIAGNOSIS — E785 Hyperlipidemia, unspecified: Secondary | ICD-10-CM | POA: Diagnosis not present

## 2022-11-13 DIAGNOSIS — I480 Paroxysmal atrial fibrillation: Secondary | ICD-10-CM | POA: Diagnosis not present

## 2022-11-13 DIAGNOSIS — E875 Hyperkalemia: Secondary | ICD-10-CM | POA: Diagnosis not present

## 2022-11-13 DIAGNOSIS — K5730 Diverticulosis of large intestine without perforation or abscess without bleeding: Secondary | ICD-10-CM | POA: Diagnosis not present

## 2022-11-13 DIAGNOSIS — K219 Gastro-esophageal reflux disease without esophagitis: Secondary | ICD-10-CM | POA: Diagnosis not present

## 2022-11-13 DIAGNOSIS — N179 Acute kidney failure, unspecified: Secondary | ICD-10-CM | POA: Diagnosis not present

## 2022-11-13 DIAGNOSIS — L409 Psoriasis, unspecified: Secondary | ICD-10-CM | POA: Diagnosis not present

## 2022-11-13 DIAGNOSIS — M47816 Spondylosis without myelopathy or radiculopathy, lumbar region: Secondary | ICD-10-CM | POA: Diagnosis not present

## 2022-11-13 DIAGNOSIS — I509 Heart failure, unspecified: Secondary | ICD-10-CM | POA: Diagnosis not present

## 2022-11-13 DIAGNOSIS — E1151 Type 2 diabetes mellitus with diabetic peripheral angiopathy without gangrene: Secondary | ICD-10-CM | POA: Diagnosis not present

## 2022-11-13 DIAGNOSIS — H9193 Unspecified hearing loss, bilateral: Secondary | ICD-10-CM | POA: Diagnosis not present

## 2022-11-13 DIAGNOSIS — M069 Rheumatoid arthritis, unspecified: Secondary | ICD-10-CM | POA: Diagnosis not present

## 2022-11-13 DIAGNOSIS — L89891 Pressure ulcer of other site, stage 1: Secondary | ICD-10-CM | POA: Diagnosis not present

## 2022-11-13 DIAGNOSIS — Z794 Long term (current) use of insulin: Secondary | ICD-10-CM | POA: Diagnosis not present

## 2022-11-13 DIAGNOSIS — I050 Rheumatic mitral stenosis: Secondary | ICD-10-CM | POA: Diagnosis not present

## 2022-11-13 DIAGNOSIS — L89322 Pressure ulcer of left buttock, stage 2: Secondary | ICD-10-CM | POA: Diagnosis not present

## 2022-11-13 DIAGNOSIS — I051 Rheumatic mitral insufficiency: Secondary | ICD-10-CM | POA: Diagnosis not present

## 2022-11-13 DIAGNOSIS — E1122 Type 2 diabetes mellitus with diabetic chronic kidney disease: Secondary | ICD-10-CM | POA: Diagnosis not present

## 2022-11-13 DIAGNOSIS — M1711 Unilateral primary osteoarthritis, right knee: Secondary | ICD-10-CM | POA: Diagnosis not present

## 2022-11-13 DIAGNOSIS — E1165 Type 2 diabetes mellitus with hyperglycemia: Secondary | ICD-10-CM | POA: Diagnosis not present

## 2022-11-16 ENCOUNTER — Encounter (HOSPITAL_COMMUNITY): Payer: Self-pay

## 2022-11-16 ENCOUNTER — Emergency Department (HOSPITAL_COMMUNITY)
Admission: EM | Admit: 2022-11-16 | Discharge: 2022-11-16 | Disposition: A | Discharge status: Home / Self Care | Payer: Medicare Other | Attending: Emergency Medicine | Admitting: Emergency Medicine

## 2022-11-16 ENCOUNTER — Emergency Department (HOSPITAL_COMMUNITY): Payer: Medicare Other

## 2022-11-16 DIAGNOSIS — Z794 Long term (current) use of insulin: Secondary | ICD-10-CM | POA: Diagnosis not present

## 2022-11-16 DIAGNOSIS — R58 Hemorrhage, not elsewhere classified: Secondary | ICD-10-CM | POA: Diagnosis not present

## 2022-11-16 DIAGNOSIS — S81812A Laceration without foreign body, left lower leg, initial encounter: Secondary | ICD-10-CM | POA: Insufficient documentation

## 2022-11-16 DIAGNOSIS — E119 Type 2 diabetes mellitus without complications: Secondary | ICD-10-CM | POA: Insufficient documentation

## 2022-11-16 DIAGNOSIS — W268XXA Contact with other sharp object(s), not elsewhere classified, initial encounter: Secondary | ICD-10-CM | POA: Insufficient documentation

## 2022-11-16 DIAGNOSIS — Z23 Encounter for immunization: Secondary | ICD-10-CM | POA: Insufficient documentation

## 2022-11-16 DIAGNOSIS — M549 Dorsalgia, unspecified: Secondary | ICD-10-CM | POA: Diagnosis not present

## 2022-11-16 DIAGNOSIS — M1712 Unilateral primary osteoarthritis, left knee: Secondary | ICD-10-CM | POA: Diagnosis not present

## 2022-11-16 MED ORDER — HYDROCODONE-ACETAMINOPHEN 5-325 MG PO TABS
1.0000 | ORAL_TABLET | Freq: Once | ORAL | Status: AC
  Administered 2022-11-16: 1 via ORAL
  Filled 2022-11-16: qty 1

## 2022-11-16 MED ORDER — ONDANSETRON HCL 4 MG PO TABS
4.0000 mg | ORAL_TABLET | Freq: Once | ORAL | Status: AC
  Administered 2022-11-16: 4 mg via ORAL
  Filled 2022-11-16: qty 1

## 2022-11-16 MED ORDER — TETANUS-DIPHTH-ACELL PERTUSSIS 5-2.5-18.5 LF-MCG/0.5 IM SUSY
0.5000 mL | PREFILLED_SYRINGE | Freq: Once | INTRAMUSCULAR | Status: AC
  Administered 2022-11-16: 0.5 mL via INTRAMUSCULAR
  Filled 2022-11-16: qty 0.5

## 2022-11-16 MED ORDER — LIDOCAINE-EPINEPHRINE (PF) 2 %-1:200000 IJ SOLN
10.0000 mL | Freq: Once | INTRAMUSCULAR | Status: AC
  Administered 2022-11-16: 10 mL
  Filled 2022-11-16: qty 20

## 2022-11-16 NOTE — ED Triage Notes (Signed)
Pt BIBA c/o left leg laceration from the door of the car.  Has a towel wrapped around. A/O x4  Bp 120/70 HR 70 RR 16 97 O2 RA

## 2022-11-16 NOTE — Discharge Instructions (Addendum)
You had 12 stiches placed to your LEFT lower leg, please have these removed within 7-10 days.  You may apply neosporin to the area to help with wound healing, please keep your leg elevated.  Follow up with your primary care physician as needed.

## 2022-11-16 NOTE — ED Provider Notes (Signed)
[place] EMERGENCY DEPARTMENT AT [facility] Provider Note   CSN: [id number] Arrival date & time: 11/16/22  1833     History DM, MR, Osteoporosis  Chief Complaint  Patient presents with   Laceration   Leg Injury    Shelby Walker is a 89 y.o. female.  89 year old female with a past medical history of osteoporosis, diabetes, MR presents to the ED with a chief complaint of left leg laceration which occurred prior to arrival.  Patient reports she was exiting the car when suddenly she cut her left lower leg with a sharp edge of the door.  Extensive bleeding at the scene.  She applied pressure to the area.  Her last tetanus immunization is unknown.  She did wash the wound but has not applied anything to it. Not anticoagulated, denies any other injury,   The history is provided by the patient.  Laceration Associated symptoms: no fever        Home Medications Prior to Admission medications   Medication Sig Start Date End Date Taking? Authorizing Provider  acetaminophen (TYLENOL) 500 MG tablet Take 1 tablet (500 mg total) by mouth every 8 (eight) hours as needed. 08/12/22   Leroy Sea, MD  amiodarone (PACERONE) 200 MG tablet Take 0.5 tablets (100 mg total) by mouth daily. 09/10/22   Duke Salvia, MD  atorvastatin (LIPITOR) 20 MG tablet Take 1 tablet (20 mg total) by mouth daily. 02/28/22   Nahser, Deloris Ping, MD  bismuth subsalicylate (PEPTO BISMOL) 262 MG/15ML suspension Take 30 mLs by mouth daily at 6 (six) AM.    [provider]  Calcium Carbonate-Vitamin D 600-400 MG-UNIT tablet Take 1 tablet by mouth daily at 12 noon.     [provider]  diclofenac Sodium (VOLTAREN) 1 % GEL Apply 4 g topically 4 (four) times daily. 02/15/22   Setzer, Lynnell Jude, PA-C  folic acid (FOLVITE) 1 MG tablet Take 1 mg by mouth once a week.    [provider]  gabapentin (NEURONTIN) 100 MG capsule Take 1 capsule (100 mg total) by mouth 3 (three) times daily. Patient  taking differently: Take 300 mg by mouth. 2 times per day 02/06/21   Love, Pamela S, PA-C  insulin aspart (NOVOLOG) 100 UNIT/ML FlexPen Inject 2 Units into the skin 3 (three) times daily with meals. Sliding scale 02/15/22   Setzer, Lynnell Jude, PA-C  Insulin Degludec (TRESIBA) 100 UNIT/ML SOLN Inject 4 Units into the skin at bedtime. 02/15/22   Setzer, Lynnell Jude, PA-C  methocarbamol (ROBAXIN) 500 MG tablet Take 500 mg by mouth 4 (four) times daily.    [provider]  methotrexate 50 MG/2ML injection Inject 0.4 mLs into the muscle once a week. 07/26/22   [provider]  mirtazapine (REMERON) 7.5 MG tablet Take 7.5 mg by mouth at bedtime.    [provider]  Multiple Vitamins-Minerals (CENTRUM SILVER 50+WOMEN PO) Take 1 tablet by mouth daily.    [provider]  nitroGLYCERIN (NITROSTAT) 0.4 MG SL tablet Dissolve 1 tablet under the tongue every 5 minutes as needed for chest pain. Max of 3 doses, then 911. 07/23/22   Nahser, Deloris Ping, MD  predniSONE (DELTASONE) 5 MG tablet Take 1 tablet (5 mg total) by mouth daily with breakfast. 02/15/22   Setzer, Lynnell Jude, PA-C  protein supplement shake (PREMIER PROTEIN) LIQD Take 2 oz by mouth 2 (two) times daily.    [provider]  pyrithione zinc (SELSUN BLUE DRY SCALP) 1 %  shampoo Apply 1 application topically once a week. 04/15/15   [provider]  RESTASIS 0.05 % ophthalmic emulsion Place 1 drop into both eyes 2 (two) times daily. 02/13/19   [provider]      Allergies    Cholestyramine, Compazine [prochlorperazine edisylate], Celecoxib, Sulfonamide derivatives, Hydrocodone, Infliximab, Leflunomide, Prochlorperazine, Rosuvastatin, and Sulfa antibiotics    Review of Systems   Review of Systems  Constitutional:  Negative for chills and fever.  Respiratory:  Negative for shortness of breath.   Cardiovascular:  Negative for chest pain.  Skin:  Positive for wound.  All other systems reviewed and are  negative.   Physical Exam Updated Vital Signs BP (!) 129/51 (BP Location: Right Arm)   Pulse 70   Temp 98 F (36.7 C) (Oral)   Resp 18   Ht 5' 1.5" (1.562 m)   Wt 53.6 kg   SpO2 100%   BMI 21.97 kg/m  Physical Exam Vitals and nursing note reviewed.  Constitutional:      Appearance: Normal appearance.  HENT:     Head: Normocephalic and atraumatic.     Nose: Nose normal.     Mouth/Throat:     Mouth: Mucous membranes are moist.  Cardiovascular:     Rate and Rhythm: Normal rate.  Pulmonary:     Effort: Pulmonary effort is normal.  Musculoskeletal:     Cervical back: Normal range of motion and neck supple.  Skin:    Findings: Laceration present.     Comments: Large 11 cm deep in nature, bleeding controlled   Neurological:     Mental Status: She is alert.            ED Results / Procedures / Treatments   Labs (all labs ordered are listed, but only abnormal results are displayed) Labs Reviewed - No data to display  EKG None  Radiology No results found.  Procedures .Marland KitchenLaceration Repair  Date/Time: 11/16/2022 8:33 PM  Performed by: Claude Manges, PA-C Authorized by: Claude Manges, PA-C   Consent:    Consent obtained:  Verbal   Consent given by:  Patient   Risks discussed:  Infection Universal protocol:    Patient identity confirmed:  Verbally with patient Laceration details:    Location:  Leg   Leg location:  L lower leg   Length (cm):  11   Depth (mm):  2 Treatment:    Area cleansed with:  Saline   Amount of cleaning:  Extensive   Irrigation solution:  Sterile water Skin repair:    Repair method:  Sutures   Suture size:  4-0   Suture material:  Prolene   Suture technique:  Simple interrupted   Number of sutures:  12 Approximation:    Approximation:  Close Repair type:    Repair type:  Intermediate Post-procedure details:    Dressing:  Antibiotic ointment   Procedure completion:  Tolerated well, no immediate complications      Medications Ordered in ED Medications  Tdap (BOOSTRIX) injection 0.5 mL (0.5 mLs Intramuscular Given 11/16/22 1941)  lidocaine-EPINEPHrine (XYLOCAINE W/EPI) 2 %-1:200000 (PF) injection 10 mL (10 mLs Infiltration Given 11/16/22 1941)  HYDROcodone-acetaminophen (NORCO/VICODIN) 5-325 MG per tablet 1 tablet (1 tablet Oral Given 11/16/22 1941)  ondansetron (ZOFRAN) tablet 4 mg (4 mg Oral Given 11/16/22 1941)    ED Course/ Medical Decision Making/ A&P  Medical Decision Making Risk Prescription drug management.   Patient presents to the ED status post left lower leg injury which occurred while she was trying to exit the car, her last optimization is unknown.  She has approximately an 11 cm laceration to the left lower leg.  She is not anticoagulated.  Received tetanus immunization on today's visit.  X-ray obtained which did not show any bone involvement at this time.  I discussed with her repair.  She did get subcutaneous stitches along with superficial stitches at this time.  I placed a total of 12 stitches to her left lower leg.  She tolerated the procedure well.  She is diabetic we discussed risks and benefits of antibiotic use at this time.  Patient is hemodynamically stable, will apply Neosporin to wound after repair.  She had a nonadhesive dressing applied, hemodynamically stable for discharge.   Portions of this note were generated with Scientist, clinical (histocompatibility and immunogenetics). Dictation errors may occur despite best attempts at proofreading.  Final Clinical Impression(s) / ED Diagnoses Final diagnoses:  Laceration of left lower extremity, initial encounter    Rx / DC Orders ED Discharge Orders     None         Claude Manges, Cordelia Poche 11/16/22 2034    Charlynne Pander, MD 11/17/22 [id number]

## 2022-11-16 NOTE — ED Provider Triage Note (Signed)
Emergency Medicine Provider Triage Evaluation Note  Shelby Walker , a 89 y.o. female  was evaluated in triage.  Pt complains of laceration to left leg.  Hit on door.  Unsure when her last tetanus shot was.  Review of Systems  Positive: laceration Negative: fever  Physical Exam  BP (!) 129/51 (BP Location: Right Arm)   Pulse 70   Temp 98 F (36.7 C) (Oral)   Resp 18   SpO2 100%  Gen:   Awake, no distress   Resp:  Normal effort  MSK:   Moves extremities without difficulty  Other:  11cm laceration to left leg  Medical Decision Making  Medically screening exam initiated at 6:48 PM.  Appropriate orders placed.  Shelby Walker was informed that the remainder of the evaluation will be completed by another provider, this initial triage assessment does not replace that evaluation, and the importance of remaining in the ED until their evaluation is complete.  Needs lac repair X-ray ordered   Shelby Stabile, PA-C 11/16/22 1850

## 2022-11-19 ENCOUNTER — Ambulatory Visit (INDEPENDENT_AMBULATORY_CARE_PROVIDER_SITE_OTHER): Payer: Medicare Other

## 2022-11-19 DIAGNOSIS — I4819 Other persistent atrial fibrillation: Secondary | ICD-10-CM

## 2022-11-20 DIAGNOSIS — I130 Hypertensive heart and chronic kidney disease with heart failure and stage 1 through stage 4 chronic kidney disease, or unspecified chronic kidney disease: Secondary | ICD-10-CM | POA: Diagnosis not present

## 2022-11-20 DIAGNOSIS — E1122 Type 2 diabetes mellitus with diabetic chronic kidney disease: Secondary | ICD-10-CM | POA: Diagnosis not present

## 2022-11-20 DIAGNOSIS — L89891 Pressure ulcer of other site, stage 1: Secondary | ICD-10-CM | POA: Diagnosis not present

## 2022-11-20 DIAGNOSIS — L89322 Pressure ulcer of left buttock, stage 2: Secondary | ICD-10-CM | POA: Diagnosis not present

## 2022-11-20 DIAGNOSIS — I509 Heart failure, unspecified: Secondary | ICD-10-CM | POA: Diagnosis not present

## 2022-11-20 DIAGNOSIS — M1711 Unilateral primary osteoarthritis, right knee: Secondary | ICD-10-CM | POA: Diagnosis not present

## 2022-11-20 LAB — CUP PACEART REMOTE DEVICE CHECK
Date Time Interrogation Session: 20240914123307
Implantable Pulse Generator Implant Date: 20240708

## 2022-11-22 ENCOUNTER — Telehealth: Payer: Self-pay | Admitting: *Deleted

## 2022-11-22 NOTE — Progress Notes (Signed)
Care Coordination   Note   11/22/2022 Name: [name] MRN: [id number] DOB: 03-29-1933  Shelby Walker is a 89 y.o. year old female who sees [place], Jeannett Senior, MD for primary care. I reached out to [employer] by phone today to offer care coordination services.  [name] was given information about Care Coordination services today including:   The Care Coordination services include support from the care team which includes your Nurse Coordinator, Clinical Social Worker, or Pharmacist.  The Care Coordination team is here to help remove barriers to the health concerns and goals most important to you. Care Coordination services are voluntary, and the patient may decline or stop services at any time by request to their care team member.   Care Coordination Consent Status: Patient daughter Shelby Walker [facility] on file agreed to services and verbal consent obtained.   Follow up plan:  Telephone appointment with care coordination team member scheduled for:  11/23/22  Encounter Outcome:  Patient Scheduled  [facility] Coordination Care Guide  Direct Dial: [phone]

## 2022-11-23 ENCOUNTER — Telehealth: Payer: Self-pay | Admitting: Pharmacy Technician

## 2022-11-23 ENCOUNTER — Ambulatory Visit: Payer: Self-pay | Admitting: Licensed Clinical Social Worker

## 2022-11-23 DIAGNOSIS — Z5986 Financial insecurity: Secondary | ICD-10-CM

## 2022-11-23 NOTE — Progress Notes (Signed)
[facility] Customer service manager [name])  [facility] Quality Pharmacy Team   11/23/2022  Shelby Walker [birth year] [id number]  Reason for referral: Medication assistance re enrollment questions for 2025  Referral source:  Patient's daughter Shelby Walker called in Current insurance: Unknown Med D plan, has Medicare AB and a Medicare supplemnt  Outreach:  Successful telephone call with Shelby Walker, patient's daughter.  HIPAA identifiers verified.   Patient is currently enrolled with [name] for [name], [name] and pen needles until 03/05/23. She was inquiring about re enrollment.  Informed guidelines have not been released for 2025 but a new application would be released on 12/07/22. Informed would mail the information to the patient on or around 12/13/22 as cannot re-enroll with [name] until after 12/18/22. Informed patient's daughter I would need proof of income and Med D card. Patient's daughter was agreeable to this plan and verbalized understanding   Medication Assistance Findings:  Medication assistance needs identified: Tresiba, Novolog, Novofine pen needles  Extra Help:  Not eligible for Extra Help Low Income Subsidy based on reported income and assets  Additional medication assistance options reviewed with patient as warranted:  No other options identified  Plan: I will route patient assistance letter to [name] pharmacy technician who will coordinate patient assistance program application process for medications listed above.  [name] pharmacy technician will assist with obtaining all required documents from both patient and provider(s) and submit application(s) once completed.  Thank you for allowing [name] pharmacy to be a part of this patient's care.   Shelby Walker, CPhT [place]  Office: [phone] Fax: [phone] Email: [email or web address]

## 2022-11-26 ENCOUNTER — Telehealth: Payer: Self-pay | Admitting: Physician Assistant

## 2022-11-26 DIAGNOSIS — I509 Heart failure, unspecified: Secondary | ICD-10-CM | POA: Diagnosis not present

## 2022-11-26 DIAGNOSIS — L89322 Pressure ulcer of left buttock, stage 2: Secondary | ICD-10-CM | POA: Diagnosis not present

## 2022-11-26 DIAGNOSIS — M1711 Unilateral primary osteoarthritis, right knee: Secondary | ICD-10-CM | POA: Diagnosis not present

## 2022-11-26 DIAGNOSIS — I130 Hypertensive heart and chronic kidney disease with heart failure and stage 1 through stage 4 chronic kidney disease, or unspecified chronic kidney disease: Secondary | ICD-10-CM | POA: Diagnosis not present

## 2022-11-26 DIAGNOSIS — E1122 Type 2 diabetes mellitus with diabetic chronic kidney disease: Secondary | ICD-10-CM | POA: Diagnosis not present

## 2022-11-26 DIAGNOSIS — L89891 Pressure ulcer of other site, stage 1: Secondary | ICD-10-CM | POA: Diagnosis not present

## 2022-11-26 NOTE — Patient Outreach (Signed)
Care Coordination   Initial Visit Note   11/23/2022 Name: CASILDA DANCEY MRN: [id number] DOB: Nov 25, 1933  Algernon Huxley Obryant is a 89 y.o. year old female who sees [facility], Jeannett Senior, MD for primary care. I spoke with  Algernon Huxley Simm's daughter, [name] by phone today.  What matters to the patients health and wellness today?  Symptom Management and Supportive Resources    Goals Addressed             This Visit's Progress    Obtain supportive resources   On track    Activities and task to complete in order to accomplish goals.   Keep all upcoming appointments discussed today Continue with compliance of taking medication prescribed by Doctor Implement healthy coping skills discussed to assist with management of symptoms         SDOH assessments and interventions completed:  Yes  SDOH Interventions Today    Flowsheet Row Most Recent Value  SDOH Interventions   Food Insecurity Interventions Intervention Not Indicated  Housing Interventions Intervention Not Indicated  Transportation Interventions Intervention Not Indicated        Care Coordination Interventions:  Yes, provided  Interventions Today    Flowsheet Row Most Recent Value  Chronic Disease   Chronic disease during today's visit Hypertension (HTN), Atrial Fibrillation (AFib), Diabetes, Chronic Kidney Disease/End Stage Renal Disease (ESRD)  General Interventions   General Interventions Discussed/Reviewed General Interventions Discussed, Community Resources, Doctor Visits  Doctor Visits Discussed/Reviewed Doctor Visits Discussed  Mental Health Interventions   Mental Health Discussed/Reviewed Mental Health Discussed, Coping Strategies, Anxiety, Depression, Grief and Loss  [Caregiver Stress discussed. Family provides strong supports]  Nutrition Interventions   Nutrition Discussed/Reviewed Nutrition Discussed, Supplemental nutrition  Pharmacy Interventions   Pharmacy Dicussed/Reviewed Pharmacy Topics Discussed, Medication Adherence   Safety Interventions   Safety Discussed/Reviewed Safety Discussed       Follow up plan: Follow up call scheduled for 2-4 weeks    Encounter Outcome:  Patient Visit Completed   Jenel Lucks, MSW, LCSW [facility] Care Management [facility]  [facility] [email or web address] Phone [phone] 6:46 PM

## 2022-11-26 NOTE — Telephone Encounter (Signed)
Patient would like a gel shot in her knee. [name] was here. Her [phone]

## 2022-11-26 NOTE — Patient Instructions (Signed)
Visit Information  Thank you for taking time to visit with me today. Please don't hesitate to contact me if I can be of assistance to you.   Following are the goals we discussed today:   Goals Addressed             This Visit's Progress    Obtain supportive resources   On track    Activities and task to complete in order to accomplish goals.   Keep all upcoming appointments discussed today Continue with compliance of taking medication prescribed by Doctor Implement healthy coping skills discussed to assist with management of symptoms         Our next appointment is by telephone on 10/7 at 3 PM  Please call the care guide team at [phone] if you need to cancel or reschedule your appointment.   If you are experiencing a Mental Health or Behavioral Health Crisis or need someone to talk to, please call the Suicide and Crisis Lifeline: 988 call 911   Patient verbalizes understanding of instructions and care plan provided today and agrees to view in MyChart. Active MyChart status and patient understanding of how to access instructions and care plan via MyChart confirmed with patient.     Jenel Lucks, MSW, LCSW [facility] Care Management Organ  [facility] [email or web address] Phone [phone] 6:47 PM

## 2022-11-28 DIAGNOSIS — L89891 Pressure ulcer of other site, stage 1: Secondary | ICD-10-CM | POA: Diagnosis not present

## 2022-11-28 DIAGNOSIS — I509 Heart failure, unspecified: Secondary | ICD-10-CM | POA: Diagnosis not present

## 2022-11-28 DIAGNOSIS — T1490XD Injury, unspecified, subsequent encounter: Secondary | ICD-10-CM | POA: Diagnosis not present

## 2022-11-28 DIAGNOSIS — I130 Hypertensive heart and chronic kidney disease with heart failure and stage 1 through stage 4 chronic kidney disease, or unspecified chronic kidney disease: Secondary | ICD-10-CM | POA: Diagnosis not present

## 2022-11-28 DIAGNOSIS — L89322 Pressure ulcer of left buttock, stage 2: Secondary | ICD-10-CM | POA: Diagnosis not present

## 2022-11-28 DIAGNOSIS — S81812A Laceration without foreign body, left lower leg, initial encounter: Secondary | ICD-10-CM | POA: Diagnosis not present

## 2022-11-28 DIAGNOSIS — M1711 Unilateral primary osteoarthritis, right knee: Secondary | ICD-10-CM | POA: Diagnosis not present

## 2022-11-28 DIAGNOSIS — R600 Localized edema: Secondary | ICD-10-CM | POA: Diagnosis not present

## 2022-11-28 DIAGNOSIS — Z4802 Encounter for removal of sutures: Secondary | ICD-10-CM | POA: Diagnosis not present

## 2022-11-28 DIAGNOSIS — E1122 Type 2 diabetes mellitus with diabetic chronic kidney disease: Secondary | ICD-10-CM | POA: Diagnosis not present

## 2022-12-03 DIAGNOSIS — L89891 Pressure ulcer of other site, stage 1: Secondary | ICD-10-CM | POA: Diagnosis not present

## 2022-12-03 DIAGNOSIS — M1711 Unilateral primary osteoarthritis, right knee: Secondary | ICD-10-CM | POA: Diagnosis not present

## 2022-12-03 DIAGNOSIS — M069 Rheumatoid arthritis, unspecified: Secondary | ICD-10-CM | POA: Diagnosis not present

## 2022-12-03 DIAGNOSIS — M25562 Pain in left knee: Secondary | ICD-10-CM | POA: Diagnosis not present

## 2022-12-03 DIAGNOSIS — E1122 Type 2 diabetes mellitus with diabetic chronic kidney disease: Secondary | ICD-10-CM | POA: Diagnosis not present

## 2022-12-03 DIAGNOSIS — Z79899 Other long term (current) drug therapy: Secondary | ICD-10-CM | POA: Diagnosis not present

## 2022-12-03 DIAGNOSIS — I509 Heart failure, unspecified: Secondary | ICD-10-CM | POA: Diagnosis not present

## 2022-12-03 DIAGNOSIS — S91109A Unspecified open wound of unspecified toe(s) without damage to nail, initial encounter: Secondary | ICD-10-CM | POA: Diagnosis not present

## 2022-12-03 DIAGNOSIS — M810 Age-related osteoporosis without current pathological fracture: Secondary | ICD-10-CM | POA: Diagnosis not present

## 2022-12-03 DIAGNOSIS — M1991 Primary osteoarthritis, unspecified site: Secondary | ICD-10-CM | POA: Diagnosis not present

## 2022-12-03 DIAGNOSIS — I130 Hypertensive heart and chronic kidney disease with heart failure and stage 1 through stage 4 chronic kidney disease, or unspecified chronic kidney disease: Secondary | ICD-10-CM | POA: Diagnosis not present

## 2022-12-03 DIAGNOSIS — L409 Psoriasis, unspecified: Secondary | ICD-10-CM | POA: Diagnosis not present

## 2022-12-03 DIAGNOSIS — L89322 Pressure ulcer of left buttock, stage 2: Secondary | ICD-10-CM | POA: Diagnosis not present

## 2022-12-03 DIAGNOSIS — Z6823 Body mass index (BMI) 23.0-23.9, adult: Secondary | ICD-10-CM | POA: Diagnosis not present

## 2022-12-03 DIAGNOSIS — M5136 Other intervertebral disc degeneration, lumbar region: Secondary | ICD-10-CM | POA: Diagnosis not present

## 2022-12-03 DIAGNOSIS — M7062 Trochanteric bursitis, left hip: Secondary | ICD-10-CM | POA: Diagnosis not present

## 2022-12-03 NOTE — Progress Notes (Signed)
Carelink Summary Report / Loop Recorder 

## 2022-12-04 DIAGNOSIS — L89322 Pressure ulcer of left buttock, stage 2: Secondary | ICD-10-CM | POA: Diagnosis not present

## 2022-12-04 DIAGNOSIS — I509 Heart failure, unspecified: Secondary | ICD-10-CM | POA: Diagnosis not present

## 2022-12-04 DIAGNOSIS — L89891 Pressure ulcer of other site, stage 1: Secondary | ICD-10-CM | POA: Diagnosis not present

## 2022-12-04 DIAGNOSIS — I130 Hypertensive heart and chronic kidney disease with heart failure and stage 1 through stage 4 chronic kidney disease, or unspecified chronic kidney disease: Secondary | ICD-10-CM | POA: Diagnosis not present

## 2022-12-04 DIAGNOSIS — M1711 Unilateral primary osteoarthritis, right knee: Secondary | ICD-10-CM | POA: Diagnosis not present

## 2022-12-04 DIAGNOSIS — E1122 Type 2 diabetes mellitus with diabetic chronic kidney disease: Secondary | ICD-10-CM | POA: Diagnosis not present

## 2022-12-05 ENCOUNTER — Ambulatory Visit: Payer: Medicare Other

## 2022-12-05 ENCOUNTER — Encounter: Payer: Self-pay | Admitting: Podiatry

## 2022-12-05 ENCOUNTER — Ambulatory Visit (INDEPENDENT_AMBULATORY_CARE_PROVIDER_SITE_OTHER): Payer: Medicare Other | Admitting: Podiatry

## 2022-12-05 DIAGNOSIS — B351 Tinea unguium: Secondary | ICD-10-CM

## 2022-12-05 DIAGNOSIS — M79675 Pain in left toe(s): Secondary | ICD-10-CM

## 2022-12-05 NOTE — Progress Notes (Signed)
   Chief Complaint  Patient presents with   Nail Problem    Diabetic nail care-  left foot.  Small callus plantar left hallux and medial left hallux.  Right foot tma.  No problems reported right.  Last office visit with Dr. Evlyn Kanner 08/20/22.      SUBJECTIVE Patient with a history of diabetes mellitus presents to office today complaining of elongated, thickened nails that cause pain while ambulating in shoes.  Patient is unable to trim their own nails.  History of transmetatarsal amputation right foot with Dr. Victorino Dike, [facility].      Past Medical History:  Diagnosis Date   Bruit    Carotid Doppler showed no significant abnormality     9per patient)   C. difficile colitis 07/2018   with severe sepsis   Chest pain, unspecified    Nuclear, May, 2008, no scar or ischemia   Diabetes mellitus    Diverticulosis    Dyslipidemia    Ejection fraction    EF 55-60%, echo, February, 2011 // Echocardiogram 8/21: EF 60-65, no RWMA, Gr 1 DD, GLS -14%, normal RVSF, mild LAE, trivial MR, mild MS (mean 4 mmHg), RVSP 23.4    GERD (gastroesophageal reflux disease)    Mitral regurgitation 04/21/2009   mild,  echo, February, 2011   Osteoporosis    Palpitations    possible very brief atrial fibrillation on monitor and possible reentrant tachycardia   PONV (postoperative nausea and vomiting)    Psoriasis    Rheumatoid arthritis (HCC)     OBJECTIVE General Patient is awake, alert, and oriented x 3 and in no acute distress. Derm Skin is dry and supple bilateral. Negative open lesions or macerations. Remaining integument unremarkable. Nails are tender, long, thickened and dystrophic with subungual debris, consistent with onychomycosis, 1-5 left.  No signs of infection noted.  The blister noted to the medial aspect of the left first MTP has resolved and healed completely. Vasc  DP and PT pedal pulses palpable left. Temperature gradient within normal limits.  Neuro grossly intact via light  touch Musculoskeletal Exam history of TMA right foot  ASSESSMENT 1. Diabetes Mellitus w/ peripheral neuropathy 2.  Pain due to onychomycosis of toenails left 3. PSxHx TMA RLE. Dr. Victorino Dike. 01/2021  PLAN OF CARE -Patient evaluated today. -Continue diabetic shoes with insoles with a toe filler to the right foot -Mechanical debridement of nails 1-5 left performed using a nail nipper. Filed with dremel without incident.  -Return to clinic 3 months   Felecia Shelling, DPM [facility]  Dr. Felecia Shelling, DPM    [address] [place], [place] [address]                Office [phone]  Fax [phone]

## 2022-12-05 NOTE — Progress Notes (Signed)
Right Toefiller and Left inserts times 3ea are not fitting into shoes properly  I noticed also that TF did not have carbon plate I will  order this today and daughter will come in to get when in  I cut down and smoothed out inserts to fit into shoes and patient was happy with mods  Patient or daughter will call if any other problems arise  Addison Bailey Cped, CFo, CFm

## 2022-12-06 DIAGNOSIS — I509 Heart failure, unspecified: Secondary | ICD-10-CM | POA: Diagnosis not present

## 2022-12-06 DIAGNOSIS — L89891 Pressure ulcer of other site, stage 1: Secondary | ICD-10-CM | POA: Diagnosis not present

## 2022-12-06 DIAGNOSIS — E1122 Type 2 diabetes mellitus with diabetic chronic kidney disease: Secondary | ICD-10-CM | POA: Diagnosis not present

## 2022-12-06 DIAGNOSIS — M1711 Unilateral primary osteoarthritis, right knee: Secondary | ICD-10-CM | POA: Diagnosis not present

## 2022-12-06 DIAGNOSIS — L89322 Pressure ulcer of left buttock, stage 2: Secondary | ICD-10-CM | POA: Diagnosis not present

## 2022-12-06 DIAGNOSIS — I130 Hypertensive heart and chronic kidney disease with heart failure and stage 1 through stage 4 chronic kidney disease, or unspecified chronic kidney disease: Secondary | ICD-10-CM | POA: Diagnosis not present

## 2022-12-07 DIAGNOSIS — I679 Cerebrovascular disease, unspecified: Secondary | ICD-10-CM | POA: Diagnosis not present

## 2022-12-07 DIAGNOSIS — E785 Hyperlipidemia, unspecified: Secondary | ICD-10-CM | POA: Diagnosis not present

## 2022-12-07 DIAGNOSIS — I739 Peripheral vascular disease, unspecified: Secondary | ICD-10-CM | POA: Diagnosis not present

## 2022-12-07 DIAGNOSIS — E1140 Type 2 diabetes mellitus with diabetic neuropathy, unspecified: Secondary | ICD-10-CM | POA: Diagnosis not present

## 2022-12-07 DIAGNOSIS — M810 Age-related osteoporosis without current pathological fracture: Secondary | ICD-10-CM | POA: Diagnosis not present

## 2022-12-07 DIAGNOSIS — S81802D Unspecified open wound, left lower leg, subsequent encounter: Secondary | ICD-10-CM | POA: Diagnosis not present

## 2022-12-07 DIAGNOSIS — I130 Hypertensive heart and chronic kidney disease with heart failure and stage 1 through stage 4 chronic kidney disease, or unspecified chronic kidney disease: Secondary | ICD-10-CM | POA: Diagnosis not present

## 2022-12-07 DIAGNOSIS — N1832 Chronic kidney disease, stage 3b: Secondary | ICD-10-CM | POA: Diagnosis not present

## 2022-12-07 DIAGNOSIS — Z89431 Acquired absence of right foot: Secondary | ICD-10-CM | POA: Diagnosis not present

## 2022-12-07 DIAGNOSIS — I480 Paroxysmal atrial fibrillation: Secondary | ICD-10-CM | POA: Diagnosis not present

## 2022-12-07 DIAGNOSIS — M069 Rheumatoid arthritis, unspecified: Secondary | ICD-10-CM | POA: Diagnosis not present

## 2022-12-07 DIAGNOSIS — I700 Atherosclerosis of aorta: Secondary | ICD-10-CM | POA: Diagnosis not present

## 2022-12-10 ENCOUNTER — Ambulatory Visit: Payer: Self-pay | Admitting: Licensed Clinical Social Worker

## 2022-12-10 DIAGNOSIS — L89891 Pressure ulcer of other site, stage 1: Secondary | ICD-10-CM | POA: Diagnosis not present

## 2022-12-10 DIAGNOSIS — L89322 Pressure ulcer of left buttock, stage 2: Secondary | ICD-10-CM | POA: Diagnosis not present

## 2022-12-10 DIAGNOSIS — M1711 Unilateral primary osteoarthritis, right knee: Secondary | ICD-10-CM | POA: Diagnosis not present

## 2022-12-10 DIAGNOSIS — E1122 Type 2 diabetes mellitus with diabetic chronic kidney disease: Secondary | ICD-10-CM | POA: Diagnosis not present

## 2022-12-10 DIAGNOSIS — I509 Heart failure, unspecified: Secondary | ICD-10-CM | POA: Diagnosis not present

## 2022-12-10 DIAGNOSIS — I130 Hypertensive heart and chronic kidney disease with heart failure and stage 1 through stage 4 chronic kidney disease, or unspecified chronic kidney disease: Secondary | ICD-10-CM | POA: Diagnosis not present

## 2022-12-11 NOTE — Patient Outreach (Signed)
Care Coordination   Follow Up Visit Note   12/10/2022 Name: SHELBEY CONQUEST MRN: [id number] DOB: 1933-07-30  Algernon Huxley Crest is a 89 y.o. year old female who sees [facility], Jeannett Senior, MD for primary care. I spoke with  Despina Arias by phone today.  What matters to the patients health and wellness today?  Symptom Management and Medication Needs    Goals Addressed             This Visit's Progress    Obtain supportive resources   On track    Activities and task to complete in order to accomplish goals.   Keep all upcoming appointments discussed today Continue with compliance of taking medication prescribed by Doctor Implement healthy coping skills discussed to assist with management of symptoms F/up with PCP office to inquire about medication prescription to pharmacy         SDOH assessments and interventions completed:  No     Care Coordination Interventions:  Yes, provided  Interventions Today    Flowsheet Row Most Recent Value  Chronic Disease   Chronic disease during today's visit Hypertension (HTN), Atrial Fibrillation (AFib), Chronic Kidney Disease/End Stage Renal Disease (ESRD)  General Interventions   General Interventions Discussed/Reviewed General Interventions Reviewed, Doctor Visits  [Patient continues to receive [facility] services, Nurse visited today. Pt no longer receives PT, OT, ST.]  Doctor Visits Discussed/Reviewed Doctor Visits Reviewed  [Pt's daughter will f/up with PCP office about having medication prescribed to preferred pharmacy. She has left a message with provider's CMA and is awaiting a callback]  Mental Health Interventions   Mental Health Discussed/Reviewed Mental Health Reviewed, Coping Strategies, Anxiety, Depression  [LCSW discussed strategies to promote socialization and positive mood. Family identified activities that pt may engage in after healing of leg. LCSW will look into additional events that family can review]  Nutrition Interventions   Nutrition  Discussed/Reviewed Nutrition Reviewed, Supplemental nutrition, Decreasing sugar intake  Pharmacy Interventions   Pharmacy Dicussed/Reviewed Pharmacy Topics Reviewed, Medication Adherence       Follow up plan: Follow up call scheduled for 2-4 weeks    Encounter Outcome:  Patient Visit Completed   Jenel Lucks, MSW, LCSW [facility] Care Management [facility]  [facility] [email or web address] Phone [phone] 9:26 AM

## 2022-12-11 NOTE — Patient Instructions (Signed)
Visit Information  Thank you for taking time to visit with me today. Please don't hesitate to contact me if I can be of assistance to you.   Following are the goals we discussed today:   Goals Addressed             This Visit's Progress    Obtain supportive resources   On track    Activities and task to complete in order to accomplish goals.   Keep all upcoming appointments discussed today Continue with compliance of taking medication prescribed by Doctor Implement healthy coping skills discussed to assist with management of symptoms F/up with PCP office to inquire about medication prescription to pharmacy         Our next appointment is by telephone on 11/18 at 3 PM  Please call the care guide team at [phone] if you need to cancel or reschedule your appointment.   If you are experiencing a Mental Health or Behavioral Health Crisis or need someone to talk to, please call the Suicide and Crisis Lifeline: 988 call 911   Patient verbalizes understanding of instructions and care plan provided today and agrees to view in MyChart. Active MyChart status and patient understanding of how to access instructions and care plan via MyChart confirmed with patient.     Jenel Lucks, MSW, LCSW [facility] Care Management [place]  [facility] [email or web address] Phone [phone] 9:27 AM

## 2022-12-13 DIAGNOSIS — I890 Lymphedema, not elsewhere classified: Secondary | ICD-10-CM | POA: Diagnosis not present

## 2022-12-13 DIAGNOSIS — I480 Paroxysmal atrial fibrillation: Secondary | ICD-10-CM | POA: Diagnosis not present

## 2022-12-13 DIAGNOSIS — Z7952 Long term (current) use of systemic steroids: Secondary | ICD-10-CM | POA: Diagnosis not present

## 2022-12-13 DIAGNOSIS — Z7984 Long term (current) use of oral hypoglycemic drugs: Secondary | ICD-10-CM | POA: Diagnosis not present

## 2022-12-13 DIAGNOSIS — M47816 Spondylosis without myelopathy or radiculopathy, lumbar region: Secondary | ICD-10-CM | POA: Diagnosis not present

## 2022-12-13 DIAGNOSIS — E785 Hyperlipidemia, unspecified: Secondary | ICD-10-CM | POA: Diagnosis not present

## 2022-12-13 DIAGNOSIS — K5730 Diverticulosis of large intestine without perforation or abscess without bleeding: Secondary | ICD-10-CM | POA: Diagnosis not present

## 2022-12-13 DIAGNOSIS — M069 Rheumatoid arthritis, unspecified: Secondary | ICD-10-CM | POA: Diagnosis not present

## 2022-12-13 DIAGNOSIS — H9193 Unspecified hearing loss, bilateral: Secondary | ICD-10-CM | POA: Diagnosis not present

## 2022-12-13 DIAGNOSIS — I050 Rheumatic mitral stenosis: Secondary | ICD-10-CM | POA: Diagnosis not present

## 2022-12-13 DIAGNOSIS — K219 Gastro-esophageal reflux disease without esophagitis: Secondary | ICD-10-CM | POA: Diagnosis not present

## 2022-12-13 DIAGNOSIS — N1832 Chronic kidney disease, stage 3b: Secondary | ICD-10-CM | POA: Diagnosis not present

## 2022-12-13 DIAGNOSIS — L409 Psoriasis, unspecified: Secondary | ICD-10-CM | POA: Diagnosis not present

## 2022-12-13 DIAGNOSIS — I051 Rheumatic mitral insufficiency: Secondary | ICD-10-CM | POA: Diagnosis not present

## 2022-12-13 DIAGNOSIS — Z794 Long term (current) use of insulin: Secondary | ICD-10-CM | POA: Diagnosis not present

## 2022-12-13 DIAGNOSIS — S81802D Unspecified open wound, left lower leg, subsequent encounter: Secondary | ICD-10-CM | POA: Diagnosis not present

## 2022-12-13 DIAGNOSIS — I509 Heart failure, unspecified: Secondary | ICD-10-CM | POA: Diagnosis not present

## 2022-12-13 DIAGNOSIS — E1151 Type 2 diabetes mellitus with diabetic peripheral angiopathy without gangrene: Secondary | ICD-10-CM | POA: Diagnosis not present

## 2022-12-13 DIAGNOSIS — E1122 Type 2 diabetes mellitus with diabetic chronic kidney disease: Secondary | ICD-10-CM | POA: Diagnosis not present

## 2022-12-13 DIAGNOSIS — I130 Hypertensive heart and chronic kidney disease with heart failure and stage 1 through stage 4 chronic kidney disease, or unspecified chronic kidney disease: Secondary | ICD-10-CM | POA: Diagnosis not present

## 2022-12-13 DIAGNOSIS — Z8619 Personal history of other infectious and parasitic diseases: Secondary | ICD-10-CM | POA: Diagnosis not present

## 2022-12-13 DIAGNOSIS — N281 Cyst of kidney, acquired: Secondary | ICD-10-CM | POA: Diagnosis not present

## 2022-12-13 DIAGNOSIS — E875 Hyperkalemia: Secondary | ICD-10-CM | POA: Diagnosis not present

## 2022-12-13 DIAGNOSIS — M1711 Unilateral primary osteoarthritis, right knee: Secondary | ICD-10-CM | POA: Diagnosis not present

## 2022-12-13 DIAGNOSIS — M810 Age-related osteoporosis without current pathological fracture: Secondary | ICD-10-CM | POA: Diagnosis not present

## 2022-12-14 ENCOUNTER — Telehealth: Payer: Self-pay | Admitting: Pharmacy Technician

## 2022-12-14 DIAGNOSIS — Z5986 Financial insecurity: Secondary | ICD-10-CM

## 2022-12-14 NOTE — Progress Notes (Signed)
[facility] Customer service manager [facility])                                            [facility] Quality Pharmacy Team    12/14/2022  Shelby Walker 09/22/1933 [id number]                                      Medication Assistance Referral  Referral From:  Self referral for 2025 re enrollment  Medication/Company: [name] / [name] Patient application portion:  Mailed Provider application portion: Faxed  to Dr. Adrian Prince Provider address/fax verified via: Office website  Medication/Company: [name] / [name] Patient application portion:  Mining engineer portion: Faxed  to Dr. Adrian Prince Provider address/fax verified via: Office website  [name], CPhT [place]  Office: [phone] Fax: [phone] Email: [name]

## 2022-12-17 ENCOUNTER — Telehealth: Payer: Self-pay

## 2022-12-17 DIAGNOSIS — E1122 Type 2 diabetes mellitus with diabetic chronic kidney disease: Secondary | ICD-10-CM | POA: Diagnosis not present

## 2022-12-17 DIAGNOSIS — I509 Heart failure, unspecified: Secondary | ICD-10-CM | POA: Diagnosis not present

## 2022-12-17 DIAGNOSIS — I130 Hypertensive heart and chronic kidney disease with heart failure and stage 1 through stage 4 chronic kidney disease, or unspecified chronic kidney disease: Secondary | ICD-10-CM | POA: Diagnosis not present

## 2022-12-17 DIAGNOSIS — N1832 Chronic kidney disease, stage 3b: Secondary | ICD-10-CM | POA: Diagnosis not present

## 2022-12-17 DIAGNOSIS — M1711 Unilateral primary osteoarthritis, right knee: Secondary | ICD-10-CM | POA: Diagnosis not present

## 2022-12-17 DIAGNOSIS — S81802D Unspecified open wound, left lower leg, subsequent encounter: Secondary | ICD-10-CM | POA: Diagnosis not present

## 2022-12-17 NOTE — Telephone Encounter (Signed)
Transition Care Management Unsuccessful Follow-up Telephone Call  Date of discharge and from where:  [name] 9/13  Attempts:  1st Attempt  Reason for unsuccessful TCM follow-up call:  No answer/busy   [name] [place]  [facility], [facility] Guide, Phone: [phone] Website: [email or web address]

## 2022-12-17 NOTE — Telephone Encounter (Signed)
Transition Care Management Unsuccessful Follow-up Telephone Call  Date of discharge and from where:  [name] 9/13  Attempts:  2nd Attempt  Reason for unsuccessful TCM follow-up call:  No answer/busy   [name] [place]  [facility], [facility] Guide, Phone: [phone] Website: [email or web address]

## 2022-12-20 DIAGNOSIS — M069 Rheumatoid arthritis, unspecified: Secondary | ICD-10-CM | POA: Diagnosis not present

## 2022-12-20 DIAGNOSIS — T148XXD Other injury of unspecified body region, subsequent encounter: Secondary | ICD-10-CM | POA: Diagnosis not present

## 2022-12-20 DIAGNOSIS — I87332 Chronic venous hypertension (idiopathic) with ulcer and inflammation of left lower extremity: Secondary | ICD-10-CM | POA: Diagnosis not present

## 2022-12-20 DIAGNOSIS — R600 Localized edema: Secondary | ICD-10-CM | POA: Diagnosis not present

## 2022-12-24 ENCOUNTER — Ambulatory Visit: Payer: Medicare Other

## 2022-12-24 DIAGNOSIS — I4819 Other persistent atrial fibrillation: Secondary | ICD-10-CM

## 2022-12-25 LAB — CUP PACEART REMOTE DEVICE CHECK
Date Time Interrogation Session: 20241020231950
Implantable Pulse Generator Implant Date: 20240708

## 2022-12-26 DIAGNOSIS — L089 Local infection of the skin and subcutaneous tissue, unspecified: Secondary | ICD-10-CM | POA: Diagnosis not present

## 2022-12-26 DIAGNOSIS — I87332 Chronic venous hypertension (idiopathic) with ulcer and inflammation of left lower extremity: Secondary | ICD-10-CM | POA: Diagnosis not present

## 2022-12-26 DIAGNOSIS — M069 Rheumatoid arthritis, unspecified: Secondary | ICD-10-CM | POA: Diagnosis not present

## 2022-12-26 DIAGNOSIS — R600 Localized edema: Secondary | ICD-10-CM | POA: Diagnosis not present

## 2022-12-26 DIAGNOSIS — T148XXD Other injury of unspecified body region, subsequent encounter: Secondary | ICD-10-CM | POA: Diagnosis not present

## 2023-01-02 ENCOUNTER — Telehealth: Payer: Self-pay | Admitting: Podiatry

## 2023-01-02 DIAGNOSIS — I87332 Chronic venous hypertension (idiopathic) with ulcer and inflammation of left lower extremity: Secondary | ICD-10-CM | POA: Diagnosis not present

## 2023-01-02 DIAGNOSIS — R600 Localized edema: Secondary | ICD-10-CM | POA: Diagnosis not present

## 2023-01-02 DIAGNOSIS — M069 Rheumatoid arthritis, unspecified: Secondary | ICD-10-CM | POA: Diagnosis not present

## 2023-01-02 DIAGNOSIS — T148XXD Other injury of unspecified body region, subsequent encounter: Secondary | ICD-10-CM | POA: Diagnosis not present

## 2023-01-02 DIAGNOSIS — L089 Local infection of the skin and subcutaneous tissue, unspecified: Secondary | ICD-10-CM | POA: Diagnosis not present

## 2023-01-02 NOTE — Telephone Encounter (Signed)
Pts daughter called checking to see if the metal plate has come in for pts inserts. She left message last week and has not heard anything, pt wears a size 7 w.  Checked with Nicki Guadalajara and it just came in yesterday afternoon. Pts daughter aware and per Nicki Guadalajara she can just stop in to get it. The daughter asked if Nicki Guadalajara could glue it on the bottom of the insert. I told her I believe she could. They have an appt close to our office and will stop in.

## 2023-01-03 ENCOUNTER — Ambulatory Visit: Payer: Medicare Other | Admitting: Physician Assistant

## 2023-01-03 ENCOUNTER — Telehealth: Payer: Self-pay | Admitting: Pharmacy Technician

## 2023-01-03 DIAGNOSIS — Z5986 Financial insecurity: Secondary | ICD-10-CM

## 2023-01-03 NOTE — Progress Notes (Signed)
[name])                                            [name]    01/03/2023  Shelby Walker Dec 30, 1933 [id number]  Received both patient and provider portion(s) of patient assistance application(s) for [name] and Novolog. Faxed completed application and required documents into [name].    [name], CPhT [place]  Office: [phone] Fax: [phone] Email: [name]

## 2023-01-08 ENCOUNTER — Telehealth: Payer: Self-pay | Admitting: Pharmacy Technician

## 2023-01-08 DIAGNOSIS — Z5986 Financial insecurity: Secondary | ICD-10-CM

## 2023-01-08 NOTE — Progress Notes (Signed)
[facility] Customer service manager [facility])                                            [facility] Quality Pharmacy Team    01/08/2023  KAWANDA DRUMHELLER 1933/07/16 [id number]  Care coordination call placed to [name] in regard to [name] and Novolog application.  Spoke to [place] who informs patient is APPROVED for 2025. Her enrollment will begin on 03/06/23-03/04/24. Her medications will begin processing on 03/07/23 with delivery in 10-14 business days from that date to the prescriber's office. Patient may call [name] at any time to check on refills and next fill dates by calling [phone].  [name], CPhT [place]  Office: [phone] Fax: [phone] Email: [email or web address]

## 2023-01-09 ENCOUNTER — Ambulatory Visit (INDEPENDENT_AMBULATORY_CARE_PROVIDER_SITE_OTHER): Payer: Medicare Other | Admitting: Family

## 2023-01-09 ENCOUNTER — Encounter: Payer: Self-pay | Admitting: Family

## 2023-01-09 DIAGNOSIS — S81802D Unspecified open wound, left lower leg, subsequent encounter: Secondary | ICD-10-CM

## 2023-01-09 NOTE — Progress Notes (Signed)
Carelink Summary Report / Loop Recorder 

## 2023-01-09 NOTE — Progress Notes (Signed)
Office Visit Note   Patient: Shelby Walker           Date of Birth: [birth year]           MRN: [id number] Visit Date: 01/09/2023              Requested by: Adrian Prince, MD [address] [place],  [place] [address] PCP: Adrian Prince, MD  Chief Complaint  Patient presents with   Left Leg - Wound Check      HPI: The patient is an 89 year old woman who presents today for evaluation new ulcer, traumatic to the left lower extremity.  She had a door closed on her left leg in September of this year and has had difficulty healing the subsequent ulcer.  She had been following with her primary care office had just had serial Unna boots applied unfortunately things were not improving.  She is currently completing a course of ciprofloxacin.  Assessment & Plan: Visit Diagnoses:  1. Wound of left lower extremity, subsequent encounter     Plan: Will begin Vashe to dry dressing changes daily.  Given a bottle Vashe today.  Will also refer to vascular surgery as her last ABIs are 89 years old and at that time Dr. Okey Dupre felt that she may require additional testing if ulcers develop to that extremity, narrowing of vessels suspected.  She will follow-up with Dr. Lajoyce Corners in 2 weeks for consideration of graft placement.  Follow-Up Instructions: Return in about 13 days (around 01/22/2023), or c duda.   Ortho Exam  Patient is alert, oriented, no adenopathy, well-dressed, normal affect, normal respiratory effort. On examination of the left lower extremity there is trace edema present there is no erythema no cellulitis the ulcer as pictured below is filled in with 70% granulation there is buildup of fibrinous exudate tissue debrided with gauze in office.  There is no purulence no odor she does have a new satellite lesion after removal of her Unna boot today.  This is 5 mm in diameter  Imaging: No results found.   Labs: Lab Results  Component Value Date   HGBA1C 9.2 (H) 08/08/2022   HGBA1C 7.3 (H)  01/24/2022   HGBA1C 7.7 (H) 01/05/2022   ESRSEDRATE 31 (H) 05/01/2022   ESRSEDRATE 22 03/27/2022   ESRSEDRATE 18 02/09/2022   CRP 8.0 (H) 08/12/2022   CRP 8.1 (H) 08/11/2022   CRP 8.5 (H) 08/10/2022   REPTSTATUS 08/10/2022 FINAL 08/07/2022   CULT (A) 08/07/2022    ENTEROBACTER CLOACAE SUSCEPTIBILITIES PERFORMED ON PREVIOUS CULTURE WITHIN THE LAST 5 DAYS. Performed at [facility], [address] [place], [place] [address]    LABORGA ENTEROBACTER CLOACAE 08/07/2022     Lab Results  Component Value Date   ALBUMIN 3.9 09/10/2022   ALBUMIN 1.9 (L) 08/12/2022   ALBUMIN 2.2 (L) 08/11/2022   PREALBUMIN 18 02/05/2022   PREALBUMIN 15.2 (L) 01/16/2021    Lab Results  Component Value Date   MG 2.3 08/12/2022   MG 2.3 08/11/2022   MG 2.3 08/10/2022   No results found for: "VD25OH"  Lab Results  Component Value Date   PREALBUMIN 18 02/05/2022   PREALBUMIN 15.2 (L) 01/16/2021      Latest Ref Rng & Units 09/10/2022   10:06 AM 08/12/2022    4:15 AM 08/11/2022    2:06 AM  CBC EXTENDED  WBC 3.4 - 10.8 x10E3/uL 12.6  6.6  5.9   RBC 3.77 - 5.28 x10E6/uL 3.14  2.61  2.65  Hemoglobin 11.1 - 15.9 g/dL 9.1  7.9  7.9   HCT 11.9 - 46.6 % 28.4  24.4  24.5   Platelets 150 - 450 x10E3/uL 236  90  104   NEUT# 1.7 - 7.7 K/uL  5.1  5.3   Lymph# 0.7 - 4.0 K/uL  0.9  0.4      There is no height or weight on file to calculate BMI.  Orders:  Orders Placed This Encounter  Procedures   Ambulatory referral to Vascular Surgery   No orders of the defined types were placed in this encounter.    Procedures: No procedures performed  Clinical Data: No additional findings.  ROS:  All other systems negative, except as noted in the HPI. Review of Systems  Objective: Vital Signs: There were no vitals taken for this visit.  Specialty Comments:  EXAM: MRI LUMBAR SPINE WITHOUT CONTRAST   TECHNIQUE: Multiplanar, multisequence MR imaging of the lumbar spine was performed. No  intravenous contrast was administered.   COMPARISON:  RADIOGRAPHY OF THE LUMBAR DATED 11/16/2020. RADIOGRAPHY OF THE LUMBAR DATED 08/07/2018.   FINDINGS: Segmentation:  Standard.   Alignment: Grade 1 anterolisthesis of L3 on L4, L4 on L5 and L5-S1 secondary to facet disease.   Vertebrae: No acute fracture, evidence of discitis, or aggressive bone lesion.   Conus medullaris and cauda equina: Conus extends to the L1-2 level. Conus and cauda equina appear normal.   Paraspinal and other soft tissues: No acute paraspinal abnormality. Right renal cyst.   Disc levels:   Disc spaces: Degenerative disease with disc height loss at L3-4, L4-5 and L5-S1.   T12-L1: Minimal broad-based disc bulge. No foraminal or central canal stenosis.   L1-L2: No significant disc bulge. No neural foraminal stenosis. No central canal stenosis.   L2-L3: Minimal broad-based disc bulge. Mild bilateral facet arthropathy. No foraminal or central canal stenosis.   L3-L4: Broad-based disc bulge with a broad left paracentral disc protrusion with mass effect on the left intraspinal L4 nerve root. Moderate bilateral facet arthropathy. Moderate-severe spinal stenosis. Left subarticular recess stenosis. Moderate left foraminal stenosis. Mild right foraminal stenosis.   L4-L5: Broad-based disc bulge with a broad central disc protrusion. Severe bilateral facet arthropathy with ligamentum flavum infolding. Severe spinal stenosis. Mild right and severe left foraminal stenosis.   L5-S1: Mild broad-based disc bulge with a broad shallow right paracentral disc protrusion with mass effect on the right intraspinal S1 nerve root. Mild bilateral foraminal stenosis. Mild bilateral facet arthropathy. No spinal stenosis.   IMPRESSION: 1. Lumbar spine spondylosis as described above most severe at L3-4, L4-5 and L5-S1. 2. No acute osseous injury of the lumbar spine.     Electronically Signed   By: Elige Ko M.D.    On: 11/23/2020 08:11  PMFS History: Patient Active Problem List   Diagnosis Date Noted   Acute biliary pancreatitis 08/07/2022   Sepsis (HCC) 08/07/2022   Sacral wound 05/14/2022   Coronary artery disease involving native coronary artery of native heart without angina pectoris 02/21/2022   Discitis of lumbar region 02/05/2022   Herpes zoster without complication 02/05/2022   Clostridium difficile colitis 02/05/2022   Severe sepsis with acute organ dysfunction due to Gram positive bacteria (HCC) 02/02/2022   Pacemaker infection (HCC) 02/01/2022   Periostitis of lumbar spine without osteomyelitis (HCC) 02/01/2022   Acute midline low back pain without sciatica 01/26/2022   MSSA bacteremia 01/25/2022   Presence of cardiac pacemaker 01/25/2022   Severe sepsis with septic shock (HCC)  01/24/2022   AKI (acute kidney injury) (HCC) 01/24/2022   Sinus pause 01/04/2022   Acute viral bronchitis 02/06/2021   Acute blood loss anemia    Postoperative pain    S/P transmetatarsal amputation of foot, right (HCC) 01/24/2021   Protein-calorie malnutrition, severe 01/17/2021   Diabetic foot infection (HCC) 01/16/2021   Rheumatoid arthritis (HCC) 01/16/2021   Stage 3b chronic kidney disease (CKD) (HCC) 01/16/2021   DNR (do not resuscitate) 01/16/2021   Decreased anal sphincter tone 12/13/2018   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   Irritable bowel syndrome with diarrhea - post infectious 12/12/2018   Full incontinence of feces and fecal smearing 12/12/2018   Perianal dermatitis 12/12/2018   Uncontrolled type 2 diabetes mellitus with hyperglycemia (HCC) 10/26/2018   Long-term use of immunosuppressant medication-Enbrel 10/26/2018   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 08/27/2018   Thrombocytopenia (HCC)    Essential hypertension    Dyspnea on exertion    Steroid-induced hyperglycemia    Fatigue 08/06/2018   Paroxysmal atrial fibrillation (HCC)    Mixed hyperlipidemia 09/29/2016   Stable  angina (HCC) 09/29/2016   PAD (peripheral artery disease) (HCC) 08/09/2016   Preoperative clearance 05/21/2012   Precordial chest pain    Palpitations    Diabetes mellitus (HCC)    GERD (gastroesophageal reflux disease)    Ejection fraction    Carotid artery disease (HCC)    Mitral regurgitation 04/21/2009   PSORIASIS 02/09/2009   Rheumatoid arthritis flare (HCC) 02/09/2009   Past Medical History:  Diagnosis Date   Bruit    Carotid Doppler showed no significant abnormality     9per patient)   C. difficile colitis 07/2018   with severe sepsis   Chest pain, unspecified    Nuclear, May, 2008, no scar or ischemia   Diabetes mellitus    Diverticulosis    Dyslipidemia    Ejection fraction    EF 55-60%, echo, February, 2011 // Echocardiogram 8/21: EF 60-65, no RWMA, Gr 1 DD, GLS -14%, normal RVSF, mild LAE, trivial MR, mild MS (mean 4 mmHg), RVSP 23.4    GERD (gastroesophageal reflux disease)    Mitral regurgitation 04/21/2009   mild,  echo, February, 2011   Osteoporosis    Palpitations    possible very brief atrial fibrillation on monitor and possible reentrant tachycardia   PONV (postoperative nausea and vomiting)    Psoriasis    Rheumatoid arthritis (HCC)     Family History  Problem Relation Age of Onset   Heart failure Father    Heart attack Father    Diabetes Father    Heart attack Mother    Heart failure Mother    Diabetes Mother    Stroke Sister     Past Surgical History:  Procedure Laterality Date   ABDOMINAL AORTOGRAM W/LOWER EXTREMITY Right 10/19/2020   Procedure: ABDOMINAL AORTOGRAM W/LOWER EXTREMITY;  Surgeon: Iran Ouch, MD;  Location: [facility];  Service: Cardiovascular;  Laterality: Right;   CHOLECYSTECTOMY N/A 08/09/2022   Procedure: LAPAROSCOPIC CHOLECYSTECTOMY WITH INTRAOPERATIVE CHOLANGIOGRAM;  Surgeon: Quentin Ore, MD;  Location: [facility] OR;  Service: General;  Laterality: N/A;   FRACTURE SURGERY     LOOP RECORDER REMOVAL N/A  01/05/2022   Procedure: LOOP RECORDER REMOVAL;  Surgeon: Marinus Maw, MD;  Location: [facility];  Service: Cardiovascular;  Laterality: N/A;   PACEMAKER IMPLANT N/A 01/05/2022   Procedure: PACEMAKER IMPLANT;  Surgeon: Marinus Maw, MD;  Location: [facility];  Service: Cardiovascular;  Laterality: N/A;   PPM GENERATOR REMOVAL N/A 01/29/2022   Procedure: PPM GENERATOR REMOVAL;  Surgeon: Marinus Maw, MD;  Location: [facility];  Service: Cardiovascular;  Laterality: N/A;   TEE WITHOUT CARDIOVERSION N/A 01/29/2022   Procedure: TRANSESOPHAGEAL ECHOCARDIOGRAM (TEE);  Surgeon: Sande Rives, MD;  Location: [facility];  Service: Cardiovascular;  Laterality: N/A;   TRANSMETATARSAL AMPUTATION Right 01/18/2021   Procedure: TRANSMETATARSAL AMPUTATION;  Surgeon: Toni Arthurs, MD;  Location: [facility] OR;  Service: Orthopedics;  Laterality: Right;   Social History   Occupational History    Employer: RETIRED  Tobacco Use   Smoking status: Never   Smokeless tobacco: Never  Vaping Use   Vaping status: Never Used  Substance and Sexual Activity   Alcohol use: No    Alcohol/week: 0.0 standard drinks of alcohol   Drug use: No   Sexual activity: Yes    Birth control/protection: Post-menopausal

## 2023-01-10 ENCOUNTER — Telehealth: Payer: Self-pay | Admitting: Orthopedic Surgery

## 2023-01-10 NOTE — Telephone Encounter (Signed)
Shelby Walker from [facility] needs the orders for the wound. CB#[phone]

## 2023-01-11 ENCOUNTER — Other Ambulatory Visit: Payer: Self-pay | Admitting: *Deleted

## 2023-01-11 DIAGNOSIS — S81802D Unspecified open wound, left lower leg, subsequent encounter: Secondary | ICD-10-CM | POA: Diagnosis not present

## 2023-01-11 DIAGNOSIS — M1711 Unilateral primary osteoarthritis, right knee: Secondary | ICD-10-CM | POA: Diagnosis not present

## 2023-01-11 DIAGNOSIS — N1832 Chronic kidney disease, stage 3b: Secondary | ICD-10-CM | POA: Diagnosis not present

## 2023-01-11 DIAGNOSIS — M79605 Pain in left leg: Secondary | ICD-10-CM

## 2023-01-11 DIAGNOSIS — E1122 Type 2 diabetes mellitus with diabetic chronic kidney disease: Secondary | ICD-10-CM | POA: Diagnosis not present

## 2023-01-11 DIAGNOSIS — I130 Hypertensive heart and chronic kidney disease with heart failure and stage 1 through stage 4 chronic kidney disease, or unspecified chronic kidney disease: Secondary | ICD-10-CM | POA: Diagnosis not present

## 2023-01-11 DIAGNOSIS — I509 Heart failure, unspecified: Secondary | ICD-10-CM | POA: Diagnosis not present

## 2023-01-11 NOTE — Telephone Encounter (Signed)
Order given to [place].

## 2023-01-12 DIAGNOSIS — H9193 Unspecified hearing loss, bilateral: Secondary | ICD-10-CM | POA: Diagnosis not present

## 2023-01-12 DIAGNOSIS — Z794 Long term (current) use of insulin: Secondary | ICD-10-CM | POA: Diagnosis not present

## 2023-01-12 DIAGNOSIS — N1832 Chronic kidney disease, stage 3b: Secondary | ICD-10-CM | POA: Diagnosis not present

## 2023-01-12 DIAGNOSIS — S81802D Unspecified open wound, left lower leg, subsequent encounter: Secondary | ICD-10-CM | POA: Diagnosis not present

## 2023-01-12 DIAGNOSIS — Z7984 Long term (current) use of oral hypoglycemic drugs: Secondary | ICD-10-CM | POA: Diagnosis not present

## 2023-01-12 DIAGNOSIS — Z7952 Long term (current) use of systemic steroids: Secondary | ICD-10-CM | POA: Diagnosis not present

## 2023-01-12 DIAGNOSIS — M069 Rheumatoid arthritis, unspecified: Secondary | ICD-10-CM | POA: Diagnosis not present

## 2023-01-12 DIAGNOSIS — N281 Cyst of kidney, acquired: Secondary | ICD-10-CM | POA: Diagnosis not present

## 2023-01-12 DIAGNOSIS — I509 Heart failure, unspecified: Secondary | ICD-10-CM | POA: Diagnosis not present

## 2023-01-12 DIAGNOSIS — K5730 Diverticulosis of large intestine without perforation or abscess without bleeding: Secondary | ICD-10-CM | POA: Diagnosis not present

## 2023-01-12 DIAGNOSIS — I050 Rheumatic mitral stenosis: Secondary | ICD-10-CM | POA: Diagnosis not present

## 2023-01-12 DIAGNOSIS — L409 Psoriasis, unspecified: Secondary | ICD-10-CM | POA: Diagnosis not present

## 2023-01-12 DIAGNOSIS — M1711 Unilateral primary osteoarthritis, right knee: Secondary | ICD-10-CM | POA: Diagnosis not present

## 2023-01-12 DIAGNOSIS — E785 Hyperlipidemia, unspecified: Secondary | ICD-10-CM | POA: Diagnosis not present

## 2023-01-12 DIAGNOSIS — I130 Hypertensive heart and chronic kidney disease with heart failure and stage 1 through stage 4 chronic kidney disease, or unspecified chronic kidney disease: Secondary | ICD-10-CM | POA: Diagnosis not present

## 2023-01-12 DIAGNOSIS — K219 Gastro-esophageal reflux disease without esophagitis: Secondary | ICD-10-CM | POA: Diagnosis not present

## 2023-01-12 DIAGNOSIS — M810 Age-related osteoporosis without current pathological fracture: Secondary | ICD-10-CM | POA: Diagnosis not present

## 2023-01-12 DIAGNOSIS — I890 Lymphedema, not elsewhere classified: Secondary | ICD-10-CM | POA: Diagnosis not present

## 2023-01-12 DIAGNOSIS — E1122 Type 2 diabetes mellitus with diabetic chronic kidney disease: Secondary | ICD-10-CM | POA: Diagnosis not present

## 2023-01-12 DIAGNOSIS — M47816 Spondylosis without myelopathy or radiculopathy, lumbar region: Secondary | ICD-10-CM | POA: Diagnosis not present

## 2023-01-12 DIAGNOSIS — E875 Hyperkalemia: Secondary | ICD-10-CM | POA: Diagnosis not present

## 2023-01-12 DIAGNOSIS — I051 Rheumatic mitral insufficiency: Secondary | ICD-10-CM | POA: Diagnosis not present

## 2023-01-12 DIAGNOSIS — I480 Paroxysmal atrial fibrillation: Secondary | ICD-10-CM | POA: Diagnosis not present

## 2023-01-12 DIAGNOSIS — Z8619 Personal history of other infectious and parasitic diseases: Secondary | ICD-10-CM | POA: Diagnosis not present

## 2023-01-12 DIAGNOSIS — E1151 Type 2 diabetes mellitus with diabetic peripheral angiopathy without gangrene: Secondary | ICD-10-CM | POA: Diagnosis not present

## 2023-01-14 DIAGNOSIS — N1832 Chronic kidney disease, stage 3b: Secondary | ICD-10-CM | POA: Diagnosis not present

## 2023-01-14 DIAGNOSIS — L309 Dermatitis, unspecified: Secondary | ICD-10-CM | POA: Diagnosis not present

## 2023-01-14 DIAGNOSIS — M1711 Unilateral primary osteoarthritis, right knee: Secondary | ICD-10-CM | POA: Diagnosis not present

## 2023-01-14 DIAGNOSIS — S81802D Unspecified open wound, left lower leg, subsequent encounter: Secondary | ICD-10-CM | POA: Diagnosis not present

## 2023-01-14 DIAGNOSIS — I130 Hypertensive heart and chronic kidney disease with heart failure and stage 1 through stage 4 chronic kidney disease, or unspecified chronic kidney disease: Secondary | ICD-10-CM | POA: Diagnosis not present

## 2023-01-14 DIAGNOSIS — E1140 Type 2 diabetes mellitus with diabetic neuropathy, unspecified: Secondary | ICD-10-CM | POA: Diagnosis not present

## 2023-01-14 DIAGNOSIS — I509 Heart failure, unspecified: Secondary | ICD-10-CM | POA: Diagnosis not present

## 2023-01-14 DIAGNOSIS — R058 Other specified cough: Secondary | ICD-10-CM | POA: Diagnosis not present

## 2023-01-14 DIAGNOSIS — E1122 Type 2 diabetes mellitus with diabetic chronic kidney disease: Secondary | ICD-10-CM | POA: Diagnosis not present

## 2023-01-14 DIAGNOSIS — M069 Rheumatoid arthritis, unspecified: Secondary | ICD-10-CM | POA: Diagnosis not present

## 2023-01-15 ENCOUNTER — Ambulatory Visit (HOSPITAL_COMMUNITY): Payer: Medicare Other

## 2023-01-15 ENCOUNTER — Ambulatory Visit (HOSPITAL_COMMUNITY)
Admission: RE | Admit: 2023-01-15 | Discharge: 2023-01-15 | Disposition: A | Discharge status: Home / Self Care | Payer: Medicare Other | Source: Ambulatory Visit | Attending: Cardiology | Admitting: Cardiology

## 2023-01-15 DIAGNOSIS — M79605 Pain in left leg: Secondary | ICD-10-CM | POA: Insufficient documentation

## 2023-01-15 LAB — VAS US ABI WITH/WO TBI
Left ABI: 1.04
Right ABI: 1.23

## 2023-01-15 NOTE — Progress Notes (Unsigned)
Patient name: Shelby Walker MRN: [id number] DOB: Oct 17, 1933 Sex: female  REASON FOR CONSULT: Wound of left leg  HPI: Shelby Walker is a 89 y.o. female, with history of diabetes, dyslipidemia, rheumatoid arthritis that presents for wound of left leg.  Patient is referred by Barnie Del with orthopedic surgery.  Past Medical History:  Diagnosis Date   Bruit    Carotid Doppler showed no significant abnormality     9per patient)   C. difficile colitis 07/2018   with severe sepsis   Chest pain, unspecified    Nuclear, May, 2008, no scar or ischemia   Diabetes mellitus    Diverticulosis    Dyslipidemia    Ejection fraction    EF 55-60%, echo, February, 2011 // Echocardiogram 8/21: EF 60-65, no RWMA, Gr 1 DD, GLS -14%, normal RVSF, mild LAE, trivial MR, mild MS (mean 4 mmHg), RVSP 23.4    GERD (gastroesophageal reflux disease)    Mitral regurgitation 04/21/2009   mild,  echo, February, 2011   Osteoporosis    Palpitations    possible very brief atrial fibrillation on monitor and possible reentrant tachycardia   PONV (postoperative nausea and vomiting)    Psoriasis    Rheumatoid arthritis (HCC)     Past Surgical History:  Procedure Laterality Date   ABDOMINAL AORTOGRAM W/LOWER EXTREMITY Right 10/19/2020   Procedure: ABDOMINAL AORTOGRAM W/LOWER EXTREMITY;  Surgeon: Iran Ouch, MD;  [facility];  Service: Cardiovascular;  Laterality: Right;   CHOLECYSTECTOMY N/A 08/09/2022   Procedure: LAPAROSCOPIC CHOLECYSTECTOMY WITH INTRAOPERATIVE CHOLANGIOGRAM;  Surgeon: Quentin Ore, MD;  Location: [facility];  Service: General;  Laterality: N/A;   FRACTURE SURGERY     LOOP RECORDER REMOVAL N/A 01/05/2022   Procedure: LOOP RECORDER REMOVAL;  Surgeon: Marinus Maw, MD;  Location: [facility];  Service: Cardiovascular;  Laterality: N/A;   PACEMAKER IMPLANT N/A 01/05/2022   Procedure: PACEMAKER IMPLANT;  Surgeon: Marinus Maw, MD;  Location: [facility];   Service: Cardiovascular;  Laterality: N/A;   PPM GENERATOR REMOVAL N/A 01/29/2022   Procedure: PPM GENERATOR REMOVAL;  Surgeon: Marinus Maw, MD;  Location: [facility];  Service: Cardiovascular;  Laterality: N/A;   TEE WITHOUT CARDIOVERSION N/A 01/29/2022   Procedure: TRANSESOPHAGEAL ECHOCARDIOGRAM (TEE);  Surgeon: Sande Rives, MD;  Location: [facility] ENDOSCOPY;  Service: Cardiovascular;  Laterality: N/A;   TRANSMETATARSAL AMPUTATION Right 01/18/2021   Procedure: TRANSMETATARSAL AMPUTATION;  Surgeon: Toni Arthurs, MD;  Location: [facility] OR;  Service: Orthopedics;  Laterality: Right;    Family History  Problem Relation Age of Onset   Heart failure Father    Heart attack Father    Diabetes Father    Heart attack Mother    Heart failure Mother    Diabetes Mother    Stroke Sister     SOCIAL HISTORY: Social History   Socioeconomic History   Marital status: Widowed    Spouse name: Greggory Stallion   Number of children: 2   Years of education: 13   Highest education level: Associate degree: academic program  Occupational History    Employer: RETIRED  Tobacco Use   Smoking status: Never   Smokeless tobacco: Never  Vaping Use   Vaping status: Never Used  Substance and Sexual Activity   Alcohol use: No    Alcohol/week: 0.0 standard drinks of alcohol   Drug use: No   Sexual activity: Yes    Birth control/protection: Post-menopausal  Other  Topics Concern   Not on file  Social History Narrative   She is married to [place]ne and lives with him 2 children   Retired   No alcohol or drugs and never smoker   Social Determinants of Corporate investment banker Strain: Low Risk  (07/24/2018)   Overall Financial Resource Strain (CARDIA)    Difficulty of Paying Living Expenses: Not hard at all  Food Insecurity: No Food Insecurity (11/23/2022)   Hunger Vital Sign    Worried About Running Out of Food in the Last Year: Never true    Ran Out of Food in the Last Year: Never true   Transportation Needs: No Transportation Needs (11/23/2022)   PRAPARE - Administrator, Civil Service (Medical): No    Lack of Transportation (Non-Medical): No  Physical Activity: Sufficiently Active (07/24/2018)   Exercise Vital Sign    Days of Exercise per Week: 5 days    Minutes of Exercise per Session: 30 min  Stress: No Stress Concern Present (07/24/2018)   [employer] of Occupational Health - Occupational Stress Questionnaire    Feeling of Stress : Not at all  Social Connections: Unknown (07/16/2021)   [facility] from [facility]   Social Network    Social Network: Not on file  Intimate Partner Violence: Not At Risk (08/08/2022)   Humiliation, Afraid, Rape, and Kick questionnaire    Fear of Current or Ex-Partner: No    Emotionally Abused: No    Physically Abused: No    Sexually Abused: No    Allergies  Allergen Reactions   Cholestyramine     Possible- Makes throat burn. Patient said she can't take it    Compazine [Prochlorperazine Edisylate] Swelling    REACTION: " tongue swell and unable to swallow"   Celecoxib Diarrhea   Sulfonamide Derivatives Other (See Comments)    REACTION: " broke out with fine itching bumps"   Hydrocodone Other (See Comments)   Infliximab Other (See Comments)    Other reaction(s): rash   Leflunomide Other (See Comments)    Other reaction(s): diarrhea   Prochlorperazine Other (See Comments)    Other reaction(s): Unknown  Other Reaction(s): tongue swells   Rosuvastatin     Other reaction(s): muscle aches   Sulfa Antibiotics Other (See Comments)    Other reaction(s): tongue swelling    Current Outpatient Medications  Medication Sig Dispense Refill   acetaminophen (TYLENOL) 500 MG tablet Take 1 tablet (500 mg total) by mouth every 8 (eight) hours as needed. 30 tablet 0   amiodarone (PACERONE) 200 MG tablet Take 0.5 tablets (100 mg total) by mouth daily. 15 tablet 11   atorvastatin (LIPITOR) 20 MG tablet Take 1  tablet (20 mg total) by mouth daily. 90 tablet 3   bismuth subsalicylate (PEPTO BISMOL) 262 MG/15ML suspension Take 30 mLs by mouth daily at 6 (six) AM.     Calcium Carbonate-Vitamin D 600-400 MG-UNIT tablet Take 1 tablet by mouth daily at 12 noon.      diclofenac Sodium (VOLTAREN) 1 % GEL Apply 4 g topically 4 (four) times daily.     folic acid (FOLVITE) 1 MG tablet Take 1 mg by mouth once a week.     gabapentin (NEURONTIN) 100 MG capsule Take 1 capsule (100 mg total) by mouth 3 (three) times daily. (Patient taking differently: Take 300 mg by mouth. 2 times per day) 90 capsule 0   insulin aspart (NOVOLOG) 100 UNIT/ML FlexPen Inject 2 Units into the  skin 3 (three) times daily with meals. Sliding scale 15 mL 11   Insulin Degludec (TRESIBA) 100 UNIT/ML SOLN Inject 4 Units into the skin at bedtime.     methocarbamol (ROBAXIN) 500 MG tablet Take 500 mg by mouth 4 (four) times daily.     methotrexate 50 MG/2ML injection Inject 0.4 mLs into the muscle once a week.     mirtazapine (REMERON) 7.5 MG tablet Take 7.5 mg by mouth at bedtime.     Multiple Vitamins-Minerals (CENTRUM SILVER 50+WOMEN PO) Take 1 tablet by mouth daily.     nitroGLYCERIN (NITROSTAT) 0.4 MG SL tablet Dissolve 1 tablet under the tongue every 5 minutes as needed for chest pain. Max of 3 doses, then 911. 25 tablet 3   predniSONE (DELTASONE) 5 MG tablet Take 1 tablet (5 mg total) by mouth daily with breakfast. 30 tablet 0   protein supplement shake (PREMIER PROTEIN) LIQD Take 2 oz by mouth 2 (two) times daily.     pyrithione zinc (SELSUN BLUE DRY SCALP) 1 % shampoo Apply 1 application topically once a week.     RESTASIS 0.05 % ophthalmic emulsion Place 1 drop into both eyes 2 (two) times daily.     No current facility-administered medications for this visit.    REVIEW OF SYSTEMS:  [X]  denotes positive finding, [ ]  denotes negative finding Cardiac  Comments:  Chest pain or chest pressure: ***   Shortness of breath upon exertion:     Short of breath when lying flat:    Irregular heart rhythm:        Vascular    Pain in calf, thigh, or hip brought on by ambulation:    Pain in feet at night that wakes you up from your sleep:     Blood clot in your veins:    Leg swelling:         Pulmonary    Oxygen at home:    Productive cough:     Wheezing:         Neurologic    Sudden weakness in arms or legs:     Sudden numbness in arms or legs:     Sudden onset of difficulty speaking or slurred speech:    Temporary loss of vision in one eye:     Problems with dizziness:         Gastrointestinal    Blood in stool:     Vomited blood:         Genitourinary    Burning when urinating:     Blood in urine:        Psychiatric    Major depression:         Hematologic    Bleeding problems:    Problems with blood clotting too easily:        Skin    Rashes or ulcers:        Constitutional    Fever or chills:      PHYSICAL EXAM: There were no vitals filed for this visit.  GENERAL: The patient is a well-nourished female, in no acute distress. The vital signs are documented above. CARDIAC: There is a regular rate and rhythm.  VASCULAR: *** PULMONARY: There is good air exchange bilaterally without wheezing or rales. ABDOMEN: Soft and non-tender with normal pitched bowel sounds.  MUSCULOSKELETAL: There are no major deformities or cyanosis. NEUROLOGIC: No focal weakness or paresthesias are detected. SKIN: There are no ulcers or rashes noted. PSYCHIATRIC: The patient has a normal affect.  DATA:   ***  Assessment/Plan:  ***   Cephus Shelling, MD [facility] Office: [phone]

## 2023-01-16 ENCOUNTER — Ambulatory Visit (INDEPENDENT_AMBULATORY_CARE_PROVIDER_SITE_OTHER): Payer: Medicare Other | Admitting: Vascular Surgery

## 2023-01-16 ENCOUNTER — Encounter: Payer: Self-pay | Admitting: Vascular Surgery

## 2023-01-16 VITALS — BP 119/56 | HR 84 | Temp 97.7°F | Ht 61.0 in | Wt 122.0 lb

## 2023-01-16 DIAGNOSIS — I739 Peripheral vascular disease, unspecified: Secondary | ICD-10-CM | POA: Diagnosis not present

## 2023-01-16 DIAGNOSIS — N1832 Chronic kidney disease, stage 3b: Secondary | ICD-10-CM | POA: Diagnosis not present

## 2023-01-16 DIAGNOSIS — E1140 Type 2 diabetes mellitus with diabetic neuropathy, unspecified: Secondary | ICD-10-CM | POA: Diagnosis not present

## 2023-01-16 DIAGNOSIS — I130 Hypertensive heart and chronic kidney disease with heart failure and stage 1 through stage 4 chronic kidney disease, or unspecified chronic kidney disease: Secondary | ICD-10-CM | POA: Diagnosis not present

## 2023-01-16 DIAGNOSIS — E785 Hyperlipidemia, unspecified: Secondary | ICD-10-CM | POA: Diagnosis not present

## 2023-01-16 DIAGNOSIS — Z794 Long term (current) use of insulin: Secondary | ICD-10-CM | POA: Diagnosis not present

## 2023-01-17 DIAGNOSIS — N1832 Chronic kidney disease, stage 3b: Secondary | ICD-10-CM | POA: Diagnosis not present

## 2023-01-17 DIAGNOSIS — I509 Heart failure, unspecified: Secondary | ICD-10-CM | POA: Diagnosis not present

## 2023-01-17 DIAGNOSIS — M1711 Unilateral primary osteoarthritis, right knee: Secondary | ICD-10-CM | POA: Diagnosis not present

## 2023-01-17 DIAGNOSIS — I130 Hypertensive heart and chronic kidney disease with heart failure and stage 1 through stage 4 chronic kidney disease, or unspecified chronic kidney disease: Secondary | ICD-10-CM | POA: Diagnosis not present

## 2023-01-17 DIAGNOSIS — S81802D Unspecified open wound, left lower leg, subsequent encounter: Secondary | ICD-10-CM | POA: Diagnosis not present

## 2023-01-17 DIAGNOSIS — E1122 Type 2 diabetes mellitus with diabetic chronic kidney disease: Secondary | ICD-10-CM | POA: Diagnosis not present

## 2023-01-18 DIAGNOSIS — M5416 Radiculopathy, lumbar region: Secondary | ICD-10-CM | POA: Diagnosis not present

## 2023-01-21 ENCOUNTER — Ambulatory Visit: Payer: Self-pay | Admitting: Licensed Clinical Social Worker

## 2023-01-21 ENCOUNTER — Ambulatory Visit (INDEPENDENT_AMBULATORY_CARE_PROVIDER_SITE_OTHER): Payer: Medicare Other | Admitting: Orthopedic Surgery

## 2023-01-21 ENCOUNTER — Encounter: Payer: Self-pay | Admitting: Orthopedic Surgery

## 2023-01-21 DIAGNOSIS — S81802D Unspecified open wound, left lower leg, subsequent encounter: Secondary | ICD-10-CM | POA: Diagnosis not present

## 2023-01-21 NOTE — Progress Notes (Signed)
Office Visit Note   Patient: Shelby Walker           Date of Birth: 06-01-1933           MRN: [id number] Visit Date: 01/21/2023              Requested by: Adrian Prince, MD [address] [place],  [place] [address] PCP: Adrian Prince, MD  Chief Complaint  Patient presents with   Left Leg - Wound Check      HPI: Patient is an 89 year old woman with venous insufficiency ulceration left lower extremity.  Patient has undergone serial compression wraps without resolution for over 6 weeks.  Patient is seen today in follow-up.  Assessment & Plan: Visit Diagnoses:  1. Wound of left lower extremity, subsequent encounter     Plan: Kerecis micro graft 19 cm was applied.  Dressing change instructions were provided.  Follow-Up Instructions: Return in about 1 week (around 01/28/2023).   Ortho Exam  Patient is alert, oriented, no adenopathy, well-dressed, normal affect, normal respiratory effort. Examination the wound bed is flat with healthy granulation tissue.  Ankle-brachial indices on the right is 1.23 and ankle-brachial indices on the left is 1.04.  Patient does have monophasic flow with adequate pressures.  Ulcer was debrided with a 10 blade knife.  After debridement the ulcer is 3 x 6 cm.  The wound healing has stalled, the wound bed has healthy granulation tissue, and patient presents for evaluation and application of Kerecis MariGen Micro graft. After informed consent a 10 blade knife was used to debride the skin and soft tissue to healthy viable bleeding granulation tissue.  Silver nitrate was used for hemostasis. The wound measures: 6 cm in length, 3 cm  in width, 1 mm in depth, wound location anterior lateral left calf Kerecis MariGen micro tissue graft 19 cm2 was applied, and there was no wastage.  Please see the photo below of the Lot number and expiration date. The micro tissue graft was covered with a nonadherent Adaptic dressing, bolstered with 4 x 4 gauze and secured with  a compression wrap.     Imaging: No results found.    Labs: Lab Results  Component Value Date   HGBA1C 9.2 (H) 08/08/2022   HGBA1C 7.3 (H) 01/24/2022   HGBA1C 7.7 (H) 01/05/2022   ESRSEDRATE 31 (H) 05/01/2022   ESRSEDRATE 22 03/27/2022   ESRSEDRATE 18 02/09/2022   CRP 8.0 (H) 08/12/2022   CRP 8.1 (H) 08/11/2022   CRP 8.5 (H) 08/10/2022   REPTSTATUS 08/10/2022 FINAL 08/07/2022   CULT (A) 08/07/2022    ENTEROBACTER CLOACAE SUSCEPTIBILITIES PERFORMED ON PREVIOUS CULTURE WITHIN THE LAST 5 DAYS. Performed at [facility], [address], [place], [place] [address]    LABORGA ENTEROBACTER CLOACAE 08/07/2022     Lab Results  Component Value Date   ALBUMIN 3.9 09/10/2022   ALBUMIN 1.9 (L) 08/12/2022   ALBUMIN 2.2 (L) 08/11/2022   PREALBUMIN 18 02/05/2022   PREALBUMIN 15.2 (L) 01/16/2021    Lab Results  Component Value Date   MG 2.3 08/12/2022   MG 2.3 08/11/2022   MG 2.3 08/10/2022   No results found for: "VD25OH"  Lab Results  Component Value Date   PREALBUMIN 18 02/05/2022   PREALBUMIN 15.2 (L) 01/16/2021      Latest Ref Rng & Units 09/10/2022   10:06 AM 08/12/2022    4:15 AM 08/11/2022    2:06 AM  CBC EXTENDED  WBC 3.4 - 10.8 x10E3/uL  12.6  6.6  5.9   RBC 3.77 - 5.28 x10E6/uL 3.14  2.61  2.65   Hemoglobin 11.1 - 15.9 g/dL 9.1  7.9  7.9   HCT 95.1 - 46.6 % 28.4  24.4  24.5   Platelets 150 - 450 x10E3/uL 236  90  104   NEUT# 1.7 - 7.7 K/uL  5.1  5.3   Lymph# 0.7 - 4.0 K/uL  0.9  0.4      There is no height or weight on file to calculate BMI.  Orders:  No orders of the defined types were placed in this encounter.  No orders of the defined types were placed in this encounter.    Procedures: No procedures performed  Clinical Data: No additional findings.  ROS:  All other systems negative, except as noted in the HPI. Review of Systems  Objective: Vital Signs: There were no vitals taken for this visit.  Specialty Comments:  EXAM: MRI  LUMBAR SPINE WITHOUT CONTRAST   TECHNIQUE: Multiplanar, multisequence MR imaging of the lumbar spine was performed. No intravenous contrast was administered.   COMPARISON:  RADIOGRAPHY OF THE LUMBAR DATED 11/16/2020. RADIOGRAPHY OF THE LUMBAR DATED 08/07/2018.   FINDINGS: Segmentation:  Standard.   Alignment: Grade 1 anterolisthesis of L3 on L4, L4 on L5 and L5-S1 secondary to facet disease.   Vertebrae: No acute fracture, evidence of discitis, or aggressive bone lesion.   Conus medullaris and cauda equina: Conus extends to the L1-2 level. Conus and cauda equina appear normal.   Paraspinal and other soft tissues: No acute paraspinal abnormality. Right renal cyst.   Disc levels:   Disc spaces: Degenerative disease with disc height loss at L3-4, L4-5 and L5-S1.   T12-L1: Minimal broad-based disc bulge. No foraminal or central canal stenosis.   L1-L2: No significant disc bulge. No neural foraminal stenosis. No central canal stenosis.   L2-L3: Minimal broad-based disc bulge. Mild bilateral facet arthropathy. No foraminal or central canal stenosis.   L3-L4: Broad-based disc bulge with a broad left paracentral disc protrusion with mass effect on the left intraspinal L4 nerve root. Moderate bilateral facet arthropathy. Moderate-severe spinal stenosis. Left subarticular recess stenosis. Moderate left foraminal stenosis. Mild right foraminal stenosis.   L4-L5: Broad-based disc bulge with a broad central disc protrusion. Severe bilateral facet arthropathy with ligamentum flavum infolding. Severe spinal stenosis. Mild right and severe left foraminal stenosis.   L5-S1: Mild broad-based disc bulge with a broad shallow right paracentral disc protrusion with mass effect on the right intraspinal S1 nerve root. Mild bilateral foraminal stenosis. Mild bilateral facet arthropathy. No spinal stenosis.   IMPRESSION: 1. Lumbar spine spondylosis as described above most severe at  L3-4, L4-5 and L5-S1. 2. No acute osseous injury of the lumbar spine.     Electronically Signed   By: Elige Ko M.D.   On: 11/23/2020 08:11  PMFS History: Patient Active Problem List   Diagnosis Date Noted   Acute biliary pancreatitis 08/07/2022   Sepsis (HCC) 08/07/2022   Sacral wound 05/14/2022   Coronary artery disease involving native coronary artery of native heart without angina pectoris 02/21/2022   Discitis of lumbar region 02/05/2022   Herpes zoster without complication 02/05/2022   Clostridium difficile colitis 02/05/2022   Severe sepsis with acute organ dysfunction due to Gram positive bacteria (HCC) 02/02/2022   Pacemaker infection (HCC) 02/01/2022   Periostitis of lumbar spine without osteomyelitis (HCC) 02/01/2022   Acute midline low back pain without sciatica 01/26/2022   MSSA bacteremia  01/25/2022   Presence of cardiac pacemaker 01/25/2022   Severe sepsis with septic shock (HCC) 01/24/2022   AKI (acute kidney injury) (HCC) 01/24/2022   Sinus pause 01/04/2022   Acute viral bronchitis 02/06/2021   Acute blood loss anemia    Postoperative pain    S/P transmetatarsal amputation of foot, right (HCC) 01/24/2021   Protein-calorie malnutrition, severe 01/17/2021   Diabetic foot infection (HCC) 01/16/2021   Rheumatoid arthritis (HCC) 01/16/2021   Stage 3b chronic kidney disease (CKD) (HCC) 01/16/2021   DNR (do not resuscitate) 01/16/2021   Decreased anal sphincter tone 12/13/2018   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   Irritable bowel syndrome with diarrhea - post infectious 12/12/2018   Full incontinence of feces and fecal smearing 12/12/2018   Perianal dermatitis 12/12/2018   Uncontrolled type 2 diabetes mellitus with hyperglycemia (HCC) 10/26/2018   Long-term use of immunosuppressant medication-Enbrel 10/26/2018   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 08/27/2018   Thrombocytopenia (HCC)    Essential hypertension    Dyspnea on exertion     Steroid-induced hyperglycemia    Fatigue 08/06/2018   Paroxysmal atrial fibrillation (HCC)    Mixed hyperlipidemia 09/29/2016   Stable angina (HCC) 09/29/2016   PAD (peripheral artery disease) (HCC) 08/09/2016   Preoperative clearance 05/21/2012   Precordial chest pain    Palpitations    Diabetes mellitus (HCC)    GERD (gastroesophageal reflux disease)    Ejection fraction    Carotid artery disease (HCC)    Mitral regurgitation 04/21/2009   PSORIASIS 02/09/2009   Rheumatoid arthritis flare (HCC) 02/09/2009   Past Medical History:  Diagnosis Date   Bruit    Carotid Doppler showed no significant abnormality     9per patient)   C. difficile colitis 07/2018   with severe sepsis   Chest pain, unspecified    Nuclear, May, 2008, no scar or ischemia   Diabetes mellitus    Diverticulosis    Dyslipidemia    Ejection fraction    EF 55-60%, echo, February, 2011 // Echocardiogram 8/21: EF 60-65, no RWMA, Gr 1 DD, GLS -14%, normal RVSF, mild LAE, trivial MR, mild MS (mean 4 mmHg), RVSP 23.4    GERD (gastroesophageal reflux disease)    Mitral regurgitation 04/21/2009   mild,  echo, February, 2011   Osteoporosis    Palpitations    possible very brief atrial fibrillation on monitor and possible reentrant tachycardia   PONV (postoperative nausea and vomiting)    Psoriasis    Rheumatoid arthritis (HCC)     Family History  Problem Relation Age of Onset   Heart failure Father    Heart attack Father    Diabetes Father    Heart attack Mother    Heart failure Mother    Diabetes Mother    Stroke Sister     Past Surgical History:  Procedure Laterality Date   ABDOMINAL AORTOGRAM W/LOWER EXTREMITY Right 10/19/2020   Procedure: ABDOMINAL AORTOGRAM W/LOWER EXTREMITY;  Surgeon: Iran Ouch, MD;  Location: [facility] INVASIVE CV LAB;  Service: Cardiovascular;  Laterality: Right;   CHOLECYSTECTOMY N/A 08/09/2022   Procedure: LAPAROSCOPIC CHOLECYSTECTOMY WITH INTRAOPERATIVE CHOLANGIOGRAM;  Surgeon:  Quentin Ore, MD;  Location: [facility] OR;  Service: General;  Laterality: N/A;   FRACTURE SURGERY     LOOP RECORDER REMOVAL N/A 01/05/2022   Procedure: LOOP RECORDER REMOVAL;  Surgeon: Marinus Maw, MD;  Location: [facility] LAB;  Service: Cardiovascular;  Laterality: N/A;   PACEMAKER IMPLANT N/A 01/05/2022   Procedure: PACEMAKER IMPLANT;  Surgeon: Marinus Maw, MD;  [facility] INVASIVE CV LAB;  Service: Cardiovascular;  Laterality: N/A;   PPM GENERATOR REMOVAL N/A 01/29/2022   Procedure: PPM GENERATOR REMOVAL;  Surgeon: Marinus Maw, MD;  Location: [facility];  Service: Cardiovascular;  Laterality: N/A;   TEE WITHOUT CARDIOVERSION N/A 01/29/2022   Procedure: TRANSESOPHAGEAL ECHOCARDIOGRAM (TEE);  Surgeon: Sande Rives, MD;  Location: [facility] ENDOSCOPY;  Service: Cardiovascular;  Laterality: N/A;   TRANSMETATARSAL AMPUTATION Right 01/18/2021   Procedure: TRANSMETATARSAL AMPUTATION;  Surgeon: Toni Arthurs, MD;  Location: [facility] OR;  Service: Orthopedics;  Laterality: Right;   Social History   Occupational History    Employer: RETIRED  Tobacco Use   Smoking status: Never   Smokeless tobacco: Never  Vaping Use   Vaping status: Never Used  Substance and Sexual Activity   Alcohol use: No    Alcohol/week: 0.0 standard drinks of alcohol   Drug use: No   Sexual activity: Yes    Birth control/protection: Post-menopausal

## 2023-01-22 NOTE — Patient Outreach (Signed)
  Care Coordination   Follow Up Visit Note   01/21/2023 Name: COSTELLA ATONDO MRN: [id number] DOB: [birth year]  Algernon Huxley Stepanski is a 89 y.o. year old female who sees [place], Jeannett Senior, MD for primary care. I spoke with  Algernon Huxley Michalsky's daughter by phone today.  What matters to the patients health and wellness today?  Caregiver Support Services   Goals Addressed             This Visit's Progress    COMPLETED: Obtain supportive resources   On track    Activities and task to complete in order to accomplish goals.   Keep all upcoming appointments discussed today Continue with compliance of taking medication prescribed by Doctor. Continue participation with [facility] Implement healthy coping skills discussed to assist with management of symptoms F/up with PCP office to inquire about medication prescription to pharmacy         SDOH assessments and interventions completed:  No     Care Coordination Interventions:  Yes, provided  Interventions Today    Flowsheet Row Most Recent Value  Chronic Disease   Chronic disease during today's visit Chronic Kidney Disease/End Stage Renal Disease (ESRD), Diabetes  General Interventions   General Interventions Discussed/Reviewed General Interventions Reviewed, Level of Care, Doctor Visits  [Patient continues to receive [facility] for wound management and has established with Dr. Duda.]  Doctor Visits Discussed/Reviewed Doctor Visits Reviewed  Safety Interventions   Safety Discussed/Reviewed Safety Reviewed       Follow up plan: No further intervention required. Family was informed that the PCP will provide embedded case management services. Family states they understand  Encounter Outcome:  Patient Visit Completed   Jenel Lucks, MSW, LCSW [facility] Care Management [facility]  [facility] [email or web address] Phone [phone] 4:54 PM

## 2023-01-22 NOTE — Patient Instructions (Signed)
Visit Information  Thank you for taking time to visit with me today. Please don't hesitate to contact me if I can be of assistance to you.   Following are the goals we discussed today:   Goals Addressed             This Visit's Progress    COMPLETED: Obtain supportive resources   On track    Activities and task to complete in order to accomplish goals.   Keep all upcoming appointments discussed today Continue with compliance of taking medication prescribed by Doctor. Continue participation with [facility] Implement healthy coping skills discussed to assist with management of symptoms F/up with PCP office to inquire about medication prescription to pharmacy          Please call the care guide team at [phone] if you need to cancel or reschedule your appointment.   If you are experiencing a Mental Health or Behavioral Health Crisis or need someone to talk to, please call the Suicide and Crisis Lifeline: 988 call 911   Patient verbalizes understanding of instructions and care plan provided today and agrees to view in MyChart. Active MyChart status and patient understanding of how to access instructions and care plan via MyChart confirmed with patient.     No further follow up required:    Jenel Lucks, MSW, LCSW [facility] Care Management [facility]  [facility] [email or web address] Phone [phone] 4:58 PM

## 2023-01-24 DIAGNOSIS — S81802D Unspecified open wound, left lower leg, subsequent encounter: Secondary | ICD-10-CM | POA: Diagnosis not present

## 2023-01-24 DIAGNOSIS — I509 Heart failure, unspecified: Secondary | ICD-10-CM | POA: Diagnosis not present

## 2023-01-24 DIAGNOSIS — E1122 Type 2 diabetes mellitus with diabetic chronic kidney disease: Secondary | ICD-10-CM | POA: Diagnosis not present

## 2023-01-24 DIAGNOSIS — N1832 Chronic kidney disease, stage 3b: Secondary | ICD-10-CM | POA: Diagnosis not present

## 2023-01-24 DIAGNOSIS — M1711 Unilateral primary osteoarthritis, right knee: Secondary | ICD-10-CM | POA: Diagnosis not present

## 2023-01-24 DIAGNOSIS — I130 Hypertensive heart and chronic kidney disease with heart failure and stage 1 through stage 4 chronic kidney disease, or unspecified chronic kidney disease: Secondary | ICD-10-CM | POA: Diagnosis not present

## 2023-01-28 ENCOUNTER — Encounter: Payer: Self-pay | Admitting: Orthopedic Surgery

## 2023-01-28 ENCOUNTER — Ambulatory Visit: Payer: Medicare Other

## 2023-01-28 ENCOUNTER — Ambulatory Visit (INDEPENDENT_AMBULATORY_CARE_PROVIDER_SITE_OTHER): Payer: Medicare Other | Admitting: Orthopedic Surgery

## 2023-01-28 DIAGNOSIS — I87332 Chronic venous hypertension (idiopathic) with ulcer and inflammation of left lower extremity: Secondary | ICD-10-CM | POA: Diagnosis not present

## 2023-01-28 DIAGNOSIS — I4819 Other persistent atrial fibrillation: Secondary | ICD-10-CM | POA: Diagnosis not present

## 2023-01-28 DIAGNOSIS — S81802D Unspecified open wound, left lower leg, subsequent encounter: Secondary | ICD-10-CM

## 2023-01-28 LAB — CUP PACEART REMOTE DEVICE CHECK
Date Time Interrogation Session: 20241122230931
Implantable Pulse Generator Implant Date: 20240708

## 2023-01-28 NOTE — Progress Notes (Addendum)
Office Visit Note   Patient: Shelby Walker           Date of Birth: 11-12-1933           MRN: [id number] Visit Date: 01/28/2023              Requested by: Adrian Prince, MD [address] [place],  [place] [address] PCP: Adrian Prince, MD  Chief Complaint  Patient presents with   Left Leg - Wound Check      HPI: Patient is an 89 year old woman who is seen in follow-up for venous insufficiency ulcer left lower extremity status post application of Kerecis micro graft on November 18 with 19 cm.  Patient states her pain is improving.  Assessment & Plan: Visit Diagnoses:  1. Wound of left lower extremity, subsequent encounter   2. Chronic venous hypertension (idiopathic) with ulcer and inflammation of left lower extremity (HCC)     Plan: Continue with routine wound care with cleansing with Dial soap and water daily they will use gauze soaked in Vashe and a compression wrap with an Ace wrap with 4 x 4 dressing changes daily.  Patient has elected to enroll in the ISACOD study.  Follow-Up Instructions: Return in about 2 weeks (around 02/11/2023).   Ortho Exam  Patient is alert, oriented, no adenopathy, well-dressed, normal affect, normal respiratory effort. Examination the wound bed has healthy granulation tissue after gentle debridement.  Fibrinous exudative tissue was debrided.  Wound measures 3.5 x 1.5 cm and is 1 mm deep.  There is no cellulitis no odor no drainage.  No wound undermining.  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  Imaging: No results found.   Labs: Lab Results  Component Value Date   HGBA1C 9.2 (H) 08/08/2022   HGBA1C 7.3 (H) 01/24/2022   HGBA1C 7.7 (H) 01/05/2022   ESRSEDRATE 31 (H) 05/01/2022   ESRSEDRATE 22 03/27/2022   ESRSEDRATE 18 02/09/2022   CRP 8.0 (H) 08/12/2022   CRP 8.1 (H) 08/11/2022   CRP 8.5 (H) 08/10/2022   REPTSTATUS 08/10/2022 FINAL 08/07/2022   CULT (A) 08/07/2022    ENTEROBACTER CLOACAE SUSCEPTIBILITIES PERFORMED ON PREVIOUS CULTURE  WITHIN THE LAST 5 DAYS. Performed at [facility], [address] [place], [place] [address]    LABORGA ENTEROBACTER CLOACAE 08/07/2022     Lab Results  Component Value Date   ALBUMIN 3.9 09/10/2022   ALBUMIN 1.9 (L) 08/12/2022   ALBUMIN 2.2 (L) 08/11/2022   PREALBUMIN 18 02/05/2022   PREALBUMIN 15.2 (L) 01/16/2021    Lab Results  Component Value Date   MG 2.3 08/12/2022   MG 2.3 08/11/2022   MG 2.3 08/10/2022   No results found for: "VD25OH"  Lab Results  Component Value Date   PREALBUMIN 18 02/05/2022   PREALBUMIN 15.2 (L) 01/16/2021      Latest Ref Rng & Units 09/10/2022   10:06 AM 08/12/2022    4:15 AM 08/11/2022    2:06 AM  CBC EXTENDED  WBC 3.4 - 10.8 x10E3/uL 12.6  6.6  5.9   RBC 3.77 - 5.28 x10E6/uL 3.14  2.61  2.65   Hemoglobin 11.1 - 15.9 g/dL 9.1  7.9  7.9   HCT 96.2 - 46.6 % 28.4  24.4  24.5   Platelets 150 - 450 x10E3/uL 236  90  104   NEUT# 1.7 - 7.7 K/uL  5.1  5.3   Lymph# 0.7 - 4.0 K/uL  0.9  0.4  There is no height or weight on file to calculate BMI.  Orders:  No orders of the defined types were placed in this encounter.  No orders of the defined types were placed in this encounter.    Procedures: No procedures performed  Clinical Data: No additional findings.  ROS:  All other systems negative, except as noted in the HPI. Review of Systems  Objective: Vital Signs: There were no vitals taken for this visit.  Specialty Comments:  EXAM: MRI LUMBAR SPINE WITHOUT CONTRAST   TECHNIQUE: Multiplanar, multisequence MR imaging of the lumbar spine was performed. No intravenous contrast was administered.   COMPARISON:  RADIOGRAPHY OF THE LUMBAR DATED 11/16/2020. RADIOGRAPHY OF THE LUMBAR DATED 08/07/2018.   FINDINGS: Segmentation:  Standard.   Alignment: Grade 1 anterolisthesis of L3 on L4, L4 on L5 and L5-S1 secondary to facet disease.   Vertebrae: No acute fracture, evidence of discitis, or aggressive bone lesion.   Conus  medullaris and cauda equina: Conus extends to the L1-2 level. Conus and cauda equina appear normal.   Paraspinal and other soft tissues: No acute paraspinal abnormality. Right renal cyst.   Disc levels:   Disc spaces: Degenerative disease with disc height loss at L3-4, L4-5 and L5-S1.   T12-L1: Minimal broad-based disc bulge. No foraminal or central canal stenosis.   L1-L2: No significant disc bulge. No neural foraminal stenosis. No central canal stenosis.   L2-L3: Minimal broad-based disc bulge. Mild bilateral facet arthropathy. No foraminal or central canal stenosis.   L3-L4: Broad-based disc bulge with a broad left paracentral disc protrusion with mass effect on the left intraspinal L4 nerve root. Moderate bilateral facet arthropathy. Moderate-severe spinal stenosis. Left subarticular recess stenosis. Moderate left foraminal stenosis. Mild right foraminal stenosis.   L4-L5: Broad-based disc bulge with a broad central disc protrusion. Severe bilateral facet arthropathy with ligamentum flavum infolding. Severe spinal stenosis. Mild right and severe left foraminal stenosis.   L5-S1: Mild broad-based disc bulge with a broad shallow right paracentral disc protrusion with mass effect on the right intraspinal S1 nerve root. Mild bilateral foraminal stenosis. Mild bilateral facet arthropathy. No spinal stenosis.   IMPRESSION: 1. Lumbar spine spondylosis as described above most severe at L3-4, L4-5 and L5-S1. 2. No acute osseous injury of the lumbar spine.     Electronically Signed   By: Elige Ko M.D.   On: 11/23/2020 08:11  PMFS History: Patient Active Problem List   Diagnosis Date Noted   Acute biliary pancreatitis 08/07/2022   Sepsis (HCC) 08/07/2022   Sacral wound 05/14/2022   Coronary artery disease involving native coronary artery of native heart without angina pectoris 02/21/2022   Discitis of lumbar region 02/05/2022   Herpes zoster without complication  02/05/2022   Clostridium difficile colitis 02/05/2022   Severe sepsis with acute organ dysfunction due to Gram positive bacteria (HCC) 02/02/2022   Pacemaker infection (HCC) 02/01/2022   Periostitis of lumbar spine without osteomyelitis (HCC) 02/01/2022   Acute midline low back pain without sciatica 01/26/2022   MSSA bacteremia 01/25/2022   Presence of cardiac pacemaker 01/25/2022   Severe sepsis with septic shock (HCC) 01/24/2022   AKI (acute kidney injury) (HCC) 01/24/2022   Sinus pause 01/04/2022   Acute viral bronchitis 02/06/2021   Acute blood loss anemia    Postoperative pain    S/P transmetatarsal amputation of foot, right (HCC) 01/24/2021   Protein-calorie malnutrition, severe 01/17/2021   Diabetic foot infection (HCC) 01/16/2021   Rheumatoid arthritis (HCC) 01/16/2021   Stage  3b chronic kidney disease (CKD) (HCC) 01/16/2021   DNR (do not resuscitate) 01/16/2021   Decreased anal sphincter tone 12/13/2018   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   Irritable bowel syndrome with diarrhea - post infectious 12/12/2018   Full incontinence of feces and fecal smearing 12/12/2018   Perianal dermatitis 12/12/2018   Uncontrolled type 2 diabetes mellitus with hyperglycemia (HCC) 10/26/2018   Long-term use of immunosuppressant medication-Enbrel 10/26/2018   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 08/27/2018   Thrombocytopenia (HCC)    Essential hypertension    Dyspnea on exertion    Steroid-induced hyperglycemia    Fatigue 08/06/2018   Paroxysmal atrial fibrillation (HCC)    Mixed hyperlipidemia 09/29/2016   Stable angina (HCC) 09/29/2016   PAD (peripheral artery disease) (HCC) 08/09/2016   Preoperative clearance 05/21/2012   Precordial chest pain    Palpitations    Diabetes mellitus (HCC)    GERD (gastroesophageal reflux disease)    Ejection fraction    Carotid artery disease (HCC)    Mitral regurgitation 04/21/2009   PSORIASIS 02/09/2009   Rheumatoid arthritis flare (HCC)  02/09/2009   Past Medical History:  Diagnosis Date   Bruit    Carotid Doppler showed no significant abnormality     9per patient)   C. difficile colitis 07/2018   with severe sepsis   Chest pain, unspecified    Nuclear, May, 2008, no scar or ischemia   Diabetes mellitus    Diverticulosis    Dyslipidemia    Ejection fraction    EF 55-60%, echo, February, 2011 // Echocardiogram 8/21: EF 60-65, no RWMA, Gr 1 DD, GLS -14%, normal RVSF, mild LAE, trivial MR, mild MS (mean 4 mmHg), RVSP 23.4    GERD (gastroesophageal reflux disease)    Mitral regurgitation 04/21/2009   mild,  echo, February, 2011   Osteoporosis    Palpitations    possible very brief atrial fibrillation on monitor and possible reentrant tachycardia   PONV (postoperative nausea and vomiting)    Psoriasis    Rheumatoid arthritis (HCC)     Family History  Problem Relation Age of Onset   Heart failure Father    Heart attack Father    Diabetes Father    Heart attack Mother    Heart failure Mother    Diabetes Mother    Stroke Sister     Past Surgical History:  Procedure Laterality Date   ABDOMINAL AORTOGRAM W/LOWER EXTREMITY Right 10/19/2020   Procedure: ABDOMINAL AORTOGRAM W/LOWER EXTREMITY;  Surgeon: Iran Ouch, MD;  Location: [facility];  Service: Cardiovascular;  Laterality: Right;   CHOLECYSTECTOMY N/A 08/09/2022   Procedure: LAPAROSCOPIC CHOLECYSTECTOMY WITH INTRAOPERATIVE CHOLANGIOGRAM;  Surgeon: Quentin Ore, MD;  Location: [facility] OR;  Service: General;  Laterality: N/A;   FRACTURE SURGERY     LOOP RECORDER REMOVAL N/A 01/05/2022   Procedure: LOOP RECORDER REMOVAL;  Surgeon: Marinus Maw, MD;  Location: [facility];  Service: Cardiovascular;  Laterality: N/A;   PACEMAKER IMPLANT N/A 01/05/2022   Procedure: PACEMAKER IMPLANT;  Surgeon: Marinus Maw, MD;  Location: [facility];  Service: Cardiovascular;  Laterality: N/A;   PPM GENERATOR REMOVAL N/A 01/29/2022   Procedure: PPM  GENERATOR REMOVAL;  Surgeon: Marinus Maw, MD;  Location: [facility];  Service: Cardiovascular;  Laterality: N/A;   TEE WITHOUT CARDIOVERSION N/A 01/29/2022   Procedure: TRANSESOPHAGEAL ECHOCARDIOGRAM (TEE);  Surgeon: Sande Rives, MD;  Location: [facility] ENDOSCOPY;  Service: Cardiovascular;  Laterality: N/A;  TRANSMETATARSAL AMPUTATION Right 01/18/2021   Procedure: TRANSMETATARSAL AMPUTATION;  Surgeon: Toni Arthurs, MD;  Location: [facility] OR;  Service: Orthopedics;  Laterality: Right;   Social History   Occupational History    Employer: RETIRED  Tobacco Use   Smoking status: Never   Smokeless tobacco: Never  Vaping Use   Vaping status: Never Used  Substance and Sexual Activity   Alcohol use: No    Alcohol/week: 0.0 standard drinks of alcohol   Drug use: No   Sexual activity: Yes    Birth control/protection: Post-menopausal

## 2023-01-30 ENCOUNTER — Telehealth: Payer: Self-pay | Admitting: Orthopedic Surgery

## 2023-01-30 NOTE — Telephone Encounter (Signed)
Pt's daughter [name] requesting a call back. She states she has some medical questions about pt's wound. She stated wound cleanser is wound bandage to stick and take more skin off wound and also is it ok for pt to shower. Please call [name] at [phone].

## 2023-01-30 NOTE — Telephone Encounter (Signed)
I SW pt's daughter [name]. She asked if okay that the dressing was sticking to the wound when they took off the dressing. I advised that it was okay, it is doing a debridement each time, to get the wound bleeding and taking off any excess drainage and exudative tissue build up. She also asked if pt can take a shower and I told her yes, this is okay, just do not submerge wound into water to soak. And lastly she stated that pt was getting an ESI and wanted to make sure this won't affect wound healing. I asked Denny Peon and she stated that it will not for one time shot. [name] appreciated call back and all questions were answered.

## 2023-02-05 DIAGNOSIS — M5416 Radiculopathy, lumbar region: Secondary | ICD-10-CM | POA: Diagnosis not present

## 2023-02-06 DIAGNOSIS — E1122 Type 2 diabetes mellitus with diabetic chronic kidney disease: Secondary | ICD-10-CM | POA: Diagnosis not present

## 2023-02-06 DIAGNOSIS — I509 Heart failure, unspecified: Secondary | ICD-10-CM | POA: Diagnosis not present

## 2023-02-06 DIAGNOSIS — I130 Hypertensive heart and chronic kidney disease with heart failure and stage 1 through stage 4 chronic kidney disease, or unspecified chronic kidney disease: Secondary | ICD-10-CM | POA: Diagnosis not present

## 2023-02-06 DIAGNOSIS — S81802D Unspecified open wound, left lower leg, subsequent encounter: Secondary | ICD-10-CM | POA: Diagnosis not present

## 2023-02-06 DIAGNOSIS — M1711 Unilateral primary osteoarthritis, right knee: Secondary | ICD-10-CM | POA: Diagnosis not present

## 2023-02-06 DIAGNOSIS — N1832 Chronic kidney disease, stage 3b: Secondary | ICD-10-CM | POA: Diagnosis not present

## 2023-02-07 DIAGNOSIS — N1832 Chronic kidney disease, stage 3b: Secondary | ICD-10-CM | POA: Diagnosis not present

## 2023-02-07 DIAGNOSIS — I700 Atherosclerosis of aorta: Secondary | ICD-10-CM | POA: Diagnosis not present

## 2023-02-07 DIAGNOSIS — I6523 Occlusion and stenosis of bilateral carotid arteries: Secondary | ICD-10-CM | POA: Diagnosis not present

## 2023-02-07 DIAGNOSIS — D649 Anemia, unspecified: Secondary | ICD-10-CM | POA: Diagnosis not present

## 2023-02-07 DIAGNOSIS — S81802D Unspecified open wound, left lower leg, subsequent encounter: Secondary | ICD-10-CM | POA: Diagnosis not present

## 2023-02-07 DIAGNOSIS — E1140 Type 2 diabetes mellitus with diabetic neuropathy, unspecified: Secondary | ICD-10-CM | POA: Diagnosis not present

## 2023-02-07 DIAGNOSIS — M810 Age-related osteoporosis without current pathological fracture: Secondary | ICD-10-CM | POA: Diagnosis not present

## 2023-02-07 DIAGNOSIS — E785 Hyperlipidemia, unspecified: Secondary | ICD-10-CM | POA: Diagnosis not present

## 2023-02-07 DIAGNOSIS — I480 Paroxysmal atrial fibrillation: Secondary | ICD-10-CM | POA: Diagnosis not present

## 2023-02-07 DIAGNOSIS — M069 Rheumatoid arthritis, unspecified: Secondary | ICD-10-CM | POA: Diagnosis not present

## 2023-02-07 DIAGNOSIS — I129 Hypertensive chronic kidney disease with stage 1 through stage 4 chronic kidney disease, or unspecified chronic kidney disease: Secondary | ICD-10-CM | POA: Diagnosis not present

## 2023-02-07 DIAGNOSIS — I739 Peripheral vascular disease, unspecified: Secondary | ICD-10-CM | POA: Diagnosis not present

## 2023-02-11 DIAGNOSIS — E875 Hyperkalemia: Secondary | ICD-10-CM | POA: Diagnosis not present

## 2023-02-11 DIAGNOSIS — N1832 Chronic kidney disease, stage 3b: Secondary | ICD-10-CM | POA: Diagnosis not present

## 2023-02-11 DIAGNOSIS — I509 Heart failure, unspecified: Secondary | ICD-10-CM | POA: Diagnosis not present

## 2023-02-11 DIAGNOSIS — I051 Rheumatic mitral insufficiency: Secondary | ICD-10-CM | POA: Diagnosis not present

## 2023-02-11 DIAGNOSIS — M810 Age-related osteoporosis without current pathological fracture: Secondary | ICD-10-CM | POA: Diagnosis not present

## 2023-02-11 DIAGNOSIS — N281 Cyst of kidney, acquired: Secondary | ICD-10-CM | POA: Diagnosis not present

## 2023-02-11 DIAGNOSIS — Z794 Long term (current) use of insulin: Secondary | ICD-10-CM | POA: Diagnosis not present

## 2023-02-11 DIAGNOSIS — K219 Gastro-esophageal reflux disease without esophagitis: Secondary | ICD-10-CM | POA: Diagnosis not present

## 2023-02-11 DIAGNOSIS — M069 Rheumatoid arthritis, unspecified: Secondary | ICD-10-CM | POA: Diagnosis not present

## 2023-02-11 DIAGNOSIS — I890 Lymphedema, not elsewhere classified: Secondary | ICD-10-CM | POA: Diagnosis not present

## 2023-02-11 DIAGNOSIS — I480 Paroxysmal atrial fibrillation: Secondary | ICD-10-CM | POA: Diagnosis not present

## 2023-02-11 DIAGNOSIS — Z7984 Long term (current) use of oral hypoglycemic drugs: Secondary | ICD-10-CM | POA: Diagnosis not present

## 2023-02-11 DIAGNOSIS — M47816 Spondylosis without myelopathy or radiculopathy, lumbar region: Secondary | ICD-10-CM | POA: Diagnosis not present

## 2023-02-11 DIAGNOSIS — E1122 Type 2 diabetes mellitus with diabetic chronic kidney disease: Secondary | ICD-10-CM | POA: Diagnosis not present

## 2023-02-11 DIAGNOSIS — E785 Hyperlipidemia, unspecified: Secondary | ICD-10-CM | POA: Diagnosis not present

## 2023-02-11 DIAGNOSIS — S81802D Unspecified open wound, left lower leg, subsequent encounter: Secondary | ICD-10-CM | POA: Diagnosis not present

## 2023-02-11 DIAGNOSIS — Z7952 Long term (current) use of systemic steroids: Secondary | ICD-10-CM | POA: Diagnosis not present

## 2023-02-11 DIAGNOSIS — K5730 Diverticulosis of large intestine without perforation or abscess without bleeding: Secondary | ICD-10-CM | POA: Diagnosis not present

## 2023-02-11 DIAGNOSIS — E1151 Type 2 diabetes mellitus with diabetic peripheral angiopathy without gangrene: Secondary | ICD-10-CM | POA: Diagnosis not present

## 2023-02-11 DIAGNOSIS — L409 Psoriasis, unspecified: Secondary | ICD-10-CM | POA: Diagnosis not present

## 2023-02-11 DIAGNOSIS — I130 Hypertensive heart and chronic kidney disease with heart failure and stage 1 through stage 4 chronic kidney disease, or unspecified chronic kidney disease: Secondary | ICD-10-CM | POA: Diagnosis not present

## 2023-02-11 DIAGNOSIS — H9193 Unspecified hearing loss, bilateral: Secondary | ICD-10-CM | POA: Diagnosis not present

## 2023-02-11 DIAGNOSIS — M170 Bilateral primary osteoarthritis of knee: Secondary | ICD-10-CM | POA: Diagnosis not present

## 2023-02-11 DIAGNOSIS — E1142 Type 2 diabetes mellitus with diabetic polyneuropathy: Secondary | ICD-10-CM | POA: Diagnosis not present

## 2023-02-11 DIAGNOSIS — I050 Rheumatic mitral stenosis: Secondary | ICD-10-CM | POA: Diagnosis not present

## 2023-02-12 ENCOUNTER — Ambulatory Visit (HOSPITAL_BASED_OUTPATIENT_CLINIC_OR_DEPARTMENT_OTHER): Payer: Medicare Other | Admitting: Internal Medicine

## 2023-02-13 DIAGNOSIS — N1832 Chronic kidney disease, stage 3b: Secondary | ICD-10-CM | POA: Diagnosis not present

## 2023-02-13 DIAGNOSIS — S81802D Unspecified open wound, left lower leg, subsequent encounter: Secondary | ICD-10-CM | POA: Diagnosis not present

## 2023-02-13 DIAGNOSIS — M170 Bilateral primary osteoarthritis of knee: Secondary | ICD-10-CM | POA: Diagnosis not present

## 2023-02-13 DIAGNOSIS — E1122 Type 2 diabetes mellitus with diabetic chronic kidney disease: Secondary | ICD-10-CM | POA: Diagnosis not present

## 2023-02-13 DIAGNOSIS — I509 Heart failure, unspecified: Secondary | ICD-10-CM | POA: Diagnosis not present

## 2023-02-13 DIAGNOSIS — I130 Hypertensive heart and chronic kidney disease with heart failure and stage 1 through stage 4 chronic kidney disease, or unspecified chronic kidney disease: Secondary | ICD-10-CM | POA: Diagnosis not present

## 2023-02-14 ENCOUNTER — Ambulatory Visit: Payer: Medicare Other | Admitting: Orthopedic Surgery

## 2023-02-14 ENCOUNTER — Encounter: Payer: Self-pay | Admitting: Orthopedic Surgery

## 2023-02-14 DIAGNOSIS — I87332 Chronic venous hypertension (idiopathic) with ulcer and inflammation of left lower extremity: Secondary | ICD-10-CM

## 2023-02-14 DIAGNOSIS — S81802D Unspecified open wound, left lower leg, subsequent encounter: Secondary | ICD-10-CM

## 2023-02-14 NOTE — Progress Notes (Signed)
Office Visit Note   Patient: Shelby Walker           Date of Birth: [birth year]           MRN: [id number] Visit Date: 02/14/2023              Requested by: Adrian Prince, MD [address] [place],  [place] [address] PCP: Adrian Prince, MD  No chief complaint on file.     HPI: Patient is an 89 year old woman who is seen in follow-up for left lower extremity venous insufficiency ulcer.  Patient is currently in the Gerber study.  Currently using Vashe and dressing changes.  Patient states her leg looks great.  She denies any pain.  Assessment & Plan: Visit Diagnoses:  1. Wound of left lower extremity, subsequent encounter   2. Chronic venous hypertension (idiopathic) with ulcer and inflammation of left lower extremity (HCC)     Plan: Recommended a wider shoes so the great toe would not rub in the shoe.  Recommended patient's start wearing the knee-high compression sock 24 hours a day change for washing and drying the sock.  Follow-Up Instructions: Return in about 4 weeks (around 03/14/2023).   Ortho Exam  Patient is alert, oriented, no adenopathy, well-dressed, normal affect, normal respiratory effort. Examination the wound bed has healthy granulation tissue.  It is currently 2.5 x 1 cm.  Initial wound measurements were 3.5 x 1.5.  Imaging: No results found.   Labs: Lab Results  Component Value Date   HGBA1C 9.2 (H) 08/08/2022   HGBA1C 7.3 (H) 01/24/2022   HGBA1C 7.7 (H) 01/05/2022   ESRSEDRATE 31 (H) 05/01/2022   ESRSEDRATE 22 03/27/2022   ESRSEDRATE 18 02/09/2022   CRP 8.0 (H) 08/12/2022   CRP 8.1 (H) 08/11/2022   CRP 8.5 (H) 08/10/2022   REPTSTATUS 08/10/2022 FINAL 08/07/2022   CULT (A) 08/07/2022    ENTEROBACTER CLOACAE SUSCEPTIBILITIES PERFORMED ON PREVIOUS CULTURE WITHIN THE LAST 5 DAYS. Performed at [facility], [address], [place], [place] [address]    LABORGA ENTEROBACTER CLOACAE 08/07/2022     Lab Results  Component Value Date   ALBUMIN  3.9 09/10/2022   ALBUMIN 1.9 (L) 08/12/2022   ALBUMIN 2.2 (L) 08/11/2022   PREALBUMIN 18 02/05/2022   PREALBUMIN 15.2 (L) 01/16/2021    Lab Results  Component Value Date   MG 2.3 08/12/2022   MG 2.3 08/11/2022   MG 2.3 08/10/2022   No results found for: "VD25OH"  Lab Results  Component Value Date   PREALBUMIN 18 02/05/2022   PREALBUMIN 15.2 (L) 01/16/2021      Latest Ref Rng & Units 09/10/2022   10:06 AM 08/12/2022    4:15 AM 08/11/2022    2:06 AM  CBC EXTENDED  WBC 3.4 - 10.8 x10E3/uL 12.6  6.6  5.9   RBC 3.77 - 5.28 x10E6/uL 3.14  2.61  2.65   Hemoglobin 11.1 - 15.9 g/dL 9.1  7.9  7.9   HCT 10.2 - 46.6 % 28.4  24.4  24.5   Platelets 150 - 450 x10E3/uL 236  90  104   NEUT# 1.7 - 7.7 K/uL  5.1  5.3   Lymph# 0.7 - 4.0 K/uL  0.9  0.4      There is no height or weight on file to calculate BMI.  Orders:  No orders of the defined types were placed in this encounter.  No orders of the defined types were placed in this encounter.  Procedures: No procedures performed  Clinical Data: No additional findings.  ROS:  All other systems negative, except as noted in the HPI. Review of Systems  Objective: Vital Signs: There were no vitals taken for this visit.  Specialty Comments:  EXAM: MRI LUMBAR SPINE WITHOUT CONTRAST   TECHNIQUE: Multiplanar, multisequence MR imaging of the lumbar spine was performed. No intravenous contrast was administered.   COMPARISON:  RADIOGRAPHY OF THE LUMBAR DATED 11/16/2020. RADIOGRAPHY OF THE LUMBAR DATED 08/07/2018.   FINDINGS: Segmentation:  Standard.   Alignment: Grade 1 anterolisthesis of L3 on L4, L4 on L5 and L5-S1 secondary to facet disease.   Vertebrae: No acute fracture, evidence of discitis, or aggressive bone lesion.   Conus medullaris and cauda equina: Conus extends to the L1-2 level. Conus and cauda equina appear normal.   Paraspinal and other soft tissues: No acute paraspinal abnormality. Right renal cyst.    Disc levels:   Disc spaces: Degenerative disease with disc height loss at L3-4, L4-5 and L5-S1.   T12-L1: Minimal broad-based disc bulge. No foraminal or central canal stenosis.   L1-L2: No significant disc bulge. No neural foraminal stenosis. No central canal stenosis.   L2-L3: Minimal broad-based disc bulge. Mild bilateral facet arthropathy. No foraminal or central canal stenosis.   L3-L4: Broad-based disc bulge with a broad left paracentral disc protrusion with mass effect on the left intraspinal L4 nerve root. Moderate bilateral facet arthropathy. Moderate-severe spinal stenosis. Left subarticular recess stenosis. Moderate left foraminal stenosis. Mild right foraminal stenosis.   L4-L5: Broad-based disc bulge with a broad central disc protrusion. Severe bilateral facet arthropathy with ligamentum flavum infolding. Severe spinal stenosis. Mild right and severe left foraminal stenosis.   L5-S1: Mild broad-based disc bulge with a broad shallow right paracentral disc protrusion with mass effect on the right intraspinal S1 nerve root. Mild bilateral foraminal stenosis. Mild bilateral facet arthropathy. No spinal stenosis.   IMPRESSION: 1. Lumbar spine spondylosis as described above most severe at L3-4, L4-5 and L5-S1. 2. No acute osseous injury of the lumbar spine.     Electronically Signed   By: Elige Ko M.D.   On: 11/23/2020 08:11  PMFS History: Patient Active Problem List   Diagnosis Date Noted   Acute biliary pancreatitis 08/07/2022   Sepsis (HCC) 08/07/2022   Sacral wound 05/14/2022   Coronary artery disease involving native coronary artery of native heart without angina pectoris 02/21/2022   Discitis of lumbar region 02/05/2022   Herpes zoster without complication 02/05/2022   Clostridium difficile colitis 02/05/2022   Severe sepsis with acute organ dysfunction due to Gram positive bacteria (HCC) 02/02/2022   Pacemaker infection (HCC) 02/01/2022    Periostitis of lumbar spine without osteomyelitis (HCC) 02/01/2022   Acute midline low back pain without sciatica 01/26/2022   MSSA bacteremia 01/25/2022   Presence of cardiac pacemaker 01/25/2022   Severe sepsis with septic shock (HCC) 01/24/2022   AKI (acute kidney injury) (HCC) 01/24/2022   Sinus pause 01/04/2022   Acute viral bronchitis 02/06/2021   Acute blood loss anemia    Postoperative pain    S/P transmetatarsal amputation of foot, right (HCC) 01/24/2021   Protein-calorie malnutrition, severe 01/17/2021   Diabetic foot infection (HCC) 01/16/2021   Rheumatoid arthritis (HCC) 01/16/2021   Stage 3b chronic kidney disease (CKD) (HCC) 01/16/2021   DNR (do not resuscitate) 01/16/2021   Decreased anal sphincter tone 12/13/2018   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   Irritable bowel syndrome with diarrhea - post infectious 12/12/2018   Full  incontinence of feces and fecal smearing 12/12/2018   Perianal dermatitis 12/12/2018   Uncontrolled type 2 diabetes mellitus with hyperglycemia (HCC) 10/26/2018   Long-term use of immunosuppressant medication-Enbrel 10/26/2018   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 08/27/2018   Thrombocytopenia (HCC)    Essential hypertension    Dyspnea on exertion    Steroid-induced hyperglycemia    Fatigue 08/06/2018   Paroxysmal atrial fibrillation (HCC)    Mixed hyperlipidemia 09/29/2016   Stable angina (HCC) 09/29/2016   PAD (peripheral artery disease) (HCC) 08/09/2016   Preoperative clearance 05/21/2012   Precordial chest pain    Palpitations    Diabetes mellitus (HCC)    GERD (gastroesophageal reflux disease)    Ejection fraction    Carotid artery disease (HCC)    Mitral regurgitation 04/21/2009   PSORIASIS 02/09/2009   Rheumatoid arthritis flare (HCC) 02/09/2009   Past Medical History:  Diagnosis Date   Bruit    Carotid Doppler showed no significant abnormality     9per patient)   C. difficile colitis 07/2018   with severe sepsis    Chest pain, unspecified    Nuclear, May, 2008, no scar or ischemia   Diabetes mellitus    Diverticulosis    Dyslipidemia    Ejection fraction    EF 55-60%, echo, February, 2011 // Echocardiogram 8/21: EF 60-65, no RWMA, Gr 1 DD, GLS -14%, normal RVSF, mild LAE, trivial MR, mild MS (mean 4 mmHg), RVSP 23.4    GERD (gastroesophageal reflux disease)    Mitral regurgitation 04/21/2009   mild,  echo, February, 2011   Osteoporosis    Palpitations    possible very brief atrial fibrillation on monitor and possible reentrant tachycardia   PONV (postoperative nausea and vomiting)    Psoriasis    Rheumatoid arthritis (HCC)     Family History  Problem Relation Age of Onset   Heart failure Father    Heart attack Father    Diabetes Father    Heart attack Mother    Heart failure Mother    Diabetes Mother    Stroke Sister     Past Surgical History:  Procedure Laterality Date   ABDOMINAL AORTOGRAM W/LOWER EXTREMITY Right 10/19/2020   Procedure: ABDOMINAL AORTOGRAM W/LOWER EXTREMITY;  Surgeon: Iran Ouch, MD;  Location: [facility];  Service: Cardiovascular;  Laterality: Right;   CHOLECYSTECTOMY N/A 08/09/2022   Procedure: LAPAROSCOPIC CHOLECYSTECTOMY WITH INTRAOPERATIVE CHOLANGIOGRAM;  Surgeon: Quentin Ore, MD;  Location: [facility] OR;  Service: General;  Laterality: N/A;   FRACTURE SURGERY     LOOP RECORDER REMOVAL N/A 01/05/2022   Procedure: LOOP RECORDER REMOVAL;  Surgeon: Marinus Maw, MD;  Location: [facility];  Service: Cardiovascular;  Laterality: N/A;   PACEMAKER IMPLANT N/A 01/05/2022   Procedure: PACEMAKER IMPLANT;  Surgeon: Marinus Maw, MD;  Location: [facility];  Service: Cardiovascular;  Laterality: N/A;   PPM GENERATOR REMOVAL N/A 01/29/2022   Procedure: PPM GENERATOR REMOVAL;  Surgeon: Marinus Maw, MD;  Location: [facility];  Service: Cardiovascular;  Laterality: N/A;   TEE WITHOUT CARDIOVERSION N/A 01/29/2022   Procedure:  TRANSESOPHAGEAL ECHOCARDIOGRAM (TEE);  Surgeon: Sande Rives, MD;  Location: [facility] ENDOSCOPY;  Service: Cardiovascular;  Laterality: N/A;   TRANSMETATARSAL AMPUTATION Right 01/18/2021   Procedure: TRANSMETATARSAL AMPUTATION;  Surgeon: Toni Arthurs, MD;  Location: [facility] OR;  Service: Orthopedics;  Laterality: Right;   Social History   Occupational History    Employer: RETIRED  Tobacco Use  Smoking status: Never   Smokeless tobacco: Never  Vaping Use   Vaping status: Never Used  Substance and Sexual Activity   Alcohol use: No    Alcohol/week: 0.0 standard drinks of alcohol   Drug use: No   Sexual activity: Yes    Birth control/protection: Post-menopausal

## 2023-02-20 DIAGNOSIS — E1122 Type 2 diabetes mellitus with diabetic chronic kidney disease: Secondary | ICD-10-CM | POA: Diagnosis not present

## 2023-02-20 DIAGNOSIS — S81802D Unspecified open wound, left lower leg, subsequent encounter: Secondary | ICD-10-CM | POA: Diagnosis not present

## 2023-02-20 DIAGNOSIS — M170 Bilateral primary osteoarthritis of knee: Secondary | ICD-10-CM | POA: Diagnosis not present

## 2023-02-20 DIAGNOSIS — I509 Heart failure, unspecified: Secondary | ICD-10-CM | POA: Diagnosis not present

## 2023-02-20 DIAGNOSIS — N1832 Chronic kidney disease, stage 3b: Secondary | ICD-10-CM | POA: Diagnosis not present

## 2023-02-20 DIAGNOSIS — I130 Hypertensive heart and chronic kidney disease with heart failure and stage 1 through stage 4 chronic kidney disease, or unspecified chronic kidney disease: Secondary | ICD-10-CM | POA: Diagnosis not present

## 2023-02-21 DIAGNOSIS — E1151 Type 2 diabetes mellitus with diabetic peripheral angiopathy without gangrene: Secondary | ICD-10-CM | POA: Diagnosis not present

## 2023-02-21 DIAGNOSIS — I509 Heart failure, unspecified: Secondary | ICD-10-CM | POA: Diagnosis not present

## 2023-02-21 DIAGNOSIS — E1142 Type 2 diabetes mellitus with diabetic polyneuropathy: Secondary | ICD-10-CM | POA: Diagnosis not present

## 2023-02-21 DIAGNOSIS — I480 Paroxysmal atrial fibrillation: Secondary | ICD-10-CM | POA: Diagnosis not present

## 2023-02-21 DIAGNOSIS — E875 Hyperkalemia: Secondary | ICD-10-CM | POA: Diagnosis not present

## 2023-02-21 DIAGNOSIS — M170 Bilateral primary osteoarthritis of knee: Secondary | ICD-10-CM | POA: Diagnosis not present

## 2023-02-21 DIAGNOSIS — N1832 Chronic kidney disease, stage 3b: Secondary | ICD-10-CM | POA: Diagnosis not present

## 2023-02-21 DIAGNOSIS — E1122 Type 2 diabetes mellitus with diabetic chronic kidney disease: Secondary | ICD-10-CM | POA: Diagnosis not present

## 2023-02-21 DIAGNOSIS — I130 Hypertensive heart and chronic kidney disease with heart failure and stage 1 through stage 4 chronic kidney disease, or unspecified chronic kidney disease: Secondary | ICD-10-CM | POA: Diagnosis not present

## 2023-02-21 DIAGNOSIS — Z794 Long term (current) use of insulin: Secondary | ICD-10-CM | POA: Diagnosis not present

## 2023-02-21 DIAGNOSIS — S81802D Unspecified open wound, left lower leg, subsequent encounter: Secondary | ICD-10-CM | POA: Diagnosis not present

## 2023-02-21 DIAGNOSIS — M069 Rheumatoid arthritis, unspecified: Secondary | ICD-10-CM | POA: Diagnosis not present

## 2023-02-23 ENCOUNTER — Other Ambulatory Visit: Payer: Self-pay | Admitting: Cardiovascular Disease

## 2023-02-23 DIAGNOSIS — E782 Mixed hyperlipidemia: Secondary | ICD-10-CM

## 2023-02-23 DIAGNOSIS — I739 Peripheral vascular disease, unspecified: Secondary | ICD-10-CM

## 2023-02-25 NOTE — Progress Notes (Signed)
Carelink Summary Report / Loop Recorder 

## 2023-03-04 ENCOUNTER — Ambulatory Visit (INDEPENDENT_AMBULATORY_CARE_PROVIDER_SITE_OTHER): Payer: Medicare Other

## 2023-03-04 DIAGNOSIS — M5416 Radiculopathy, lumbar region: Secondary | ICD-10-CM | POA: Diagnosis not present

## 2023-03-04 DIAGNOSIS — M1712 Unilateral primary osteoarthritis, left knee: Secondary | ICD-10-CM | POA: Diagnosis not present

## 2023-03-04 DIAGNOSIS — I4819 Other persistent atrial fibrillation: Secondary | ICD-10-CM

## 2023-03-04 NOTE — Progress Notes (Signed)
 Patient name: Shelby Walker MRN: [id number] DOB: [birth year] Sex: female  REASON FOR CONSULT: 4-6 week follow-up, wound check   HPI: Shelby Walker is a 89 y.o. female, with history of diabetes, dyslipidemia, rheumatoid arthritis that presents for 4-6 week follow-up for wound check of left leg wound.  Patient was referred by Rocky Hans with orthopedic surgery. The left leg wound occurred in September on a car door.  On previous visit did feel this was healing and about 50% better.  Wound now almost completely healed on todays visit.  Of note she has had previous angiogram of her right lower extremity in 2022 with Dr. Darron and no intervention was performed as she had some nonflow-limiting SFA and popliteal disease with two-vessel runoff.  Her TMA ultimately healed.  She did have ABIs 01/15/23 that were 1.23 on the right monophasic and 1.04 on the left monophasic.  Past Medical History:  Diagnosis Date   Bruit    Carotid Doppler showed no significant abnormality     9per patient)   C. difficile colitis 07/2018   with severe sepsis   Chest pain, unspecified    Nuclear, May, 2008, no scar or ischemia   Diabetes mellitus    Diverticulosis    Dyslipidemia    Ejection fraction    EF 55-60%, echo, February, 2011 // Echocardiogram 8/21: EF 60-65, no RWMA, Gr 1 DD, GLS -14%, normal RVSF, mild LAE, trivial MR, mild MS (mean 4 mmHg), RVSP 23.4    GERD (gastroesophageal reflux disease)    Mitral regurgitation 04/21/2009   mild,  echo, February, 2011   Osteoporosis    Palpitations    possible very brief atrial fibrillation on monitor and possible reentrant tachycardia   PONV (postoperative nausea and vomiting)    Psoriasis    Rheumatoid arthritis (HCC)     Past Surgical History:  Procedure Laterality Date   ABDOMINAL AORTOGRAM W/LOWER EXTREMITY Right 10/19/2020   Procedure: ABDOMINAL AORTOGRAM W/LOWER EXTREMITY;  Surgeon: Darron Deatrice LABOR, MD;  Location: [facility];  Service:  Cardiovascular;  Laterality: Right;   CHOLECYSTECTOMY N/A 08/09/2022   Procedure: LAPAROSCOPIC CHOLECYSTECTOMY WITH INTRAOPERATIVE CHOLANGIOGRAM;  Surgeon: Lyndel Deward PARAS, MD;  Location: [facility] OR;  Service: General;  Laterality: N/A;   FRACTURE SURGERY     LOOP RECORDER REMOVAL N/A 01/05/2022   Procedure: LOOP RECORDER REMOVAL;  Surgeon: Waddell Danelle ORN, MD;  Location: [facility];  Service: Cardiovascular;  Laterality: N/A;   PACEMAKER IMPLANT N/A 01/05/2022   Procedure: PACEMAKER IMPLANT;  Surgeon: Waddell Danelle ORN, MD;  Location: [facility];  Service: Cardiovascular;  Laterality: N/A;   PPM GENERATOR REMOVAL N/A 01/29/2022   Procedure: PPM GENERATOR REMOVAL;  Surgeon: Waddell Danelle ORN, MD;  Location: [facility];  Service: Cardiovascular;  Laterality: N/A;   TEE WITHOUT CARDIOVERSION N/A 01/29/2022   Procedure: TRANSESOPHAGEAL ECHOCARDIOGRAM (TEE);  Surgeon: Barbaraann Darryle Ned, MD;  Location: [facility] ENDOSCOPY;  Service: Cardiovascular;  Laterality: N/A;   TRANSMETATARSAL AMPUTATION Right 01/18/2021   Procedure: TRANSMETATARSAL AMPUTATION;  Surgeon: Kit Rush, MD;  Location[facility] OR;  Service: Orthopedics;  Laterality: Right;    Family History  Problem Relation Age of Onset   Heart failure Father    Heart attack Father    Diabetes Father    Heart attack Mother    Heart failure Mother    Diabetes Mother    Stroke Sister     SOCIAL HISTORY: Social History   Socioeconomic  History   Marital status: Widowed    Spouse name: Zachary   Number of children: 2   Years of education: 13   Highest education level: Associate degree: academic program  Occupational History    Employer: RETIRED  Tobacco Use   Smoking status: Never   Smokeless tobacco: Never  Vaping Use   Vaping status: Never Used  Substance and Sexual Activity   Alcohol  use: No    Alcohol /week: 0.0 standard drinks of alcohol    Drug use: No   Sexual activity: Yes    Birth control/protection:  Post-menopausal  Other Topics Concern   Not on file  Social History Narrative   She is married to Brownell and lives with him 2 children   Retired   No alcohol  or drugs and never smoker   Social Drivers of Corporate Investment Banker Strain: Low Risk  (07/24/2018)   Overall Financial Resource Strain (CARDIA)    Difficulty of Paying Living Expenses: Not hard at all  Food Insecurity: No Food Insecurity (11/23/2022)   Hunger Vital Sign    Worried About Running Out of Food in the Last Year: Never true    Ran Out of Food in the Last Year: Never true  Transportation Needs: No Transportation Needs (11/23/2022)   PRAPARE - Administrator, Civil Service (Medical): No    Lack of Transportation (Non-Medical): No  Physical Activity: Sufficiently Active (07/24/2018)   Exercise Vital Sign    Days of Exercise per Week: 5 days    Minutes of Exercise per Session: 30 min  Stress: No Stress Concern Present (07/24/2018)   [employer] of Occupational Health - Occupational Stress Questionnaire    Feeling of Stress : Not at all  Social Connections: Unknown (07/16/2021)   [facility]   Social Network    Social Network: Not on file  Intimate Partner Violence: Not At Risk (08/08/2022)   Humiliation, Afraid, Rape, and Kick questionnaire    Fear of Current or Ex-Partner: No    Emotionally Abused: No    Physically Abused: No    Sexually Abused: No    Allergies  Allergen Reactions   Cholestyramine      Possible- Makes throat burn. Patient said she can't take it    Compazine [Prochlorperazine Edisylate] Swelling    REACTION:  tongue swell and unable to swallow   Celecoxib Diarrhea   Sulfonamide Derivatives Other (See Comments)    REACTION:  broke out with fine itching bumps   Hydrocodone  Other (See Comments)   Infliximab Other (See Comments)    Other reaction(s): rash   Leflunomide Other (See Comments)    Other reaction(s): diarrhea   Prochlorperazine  Other (See Comments)    Other reaction(s): Unknown  Other Reaction(s): tongue swells   Rosuvastatin     Other reaction(s): muscle aches   Sulfa Antibiotics Other (See Comments)    Other reaction(s): tongue swelling    Current Outpatient Medications  Medication Sig Dispense Refill   acetaminophen  (TYLENOL ) 500 MG tablet Take 1 tablet (500 mg total) by mouth every 8 (eight) hours as needed. 30 tablet 0   amiodarone  (PACERONE ) 200 MG tablet Take 0.5 tablets (100 mg total) by mouth daily. 15 tablet 11   atorvastatin  (LIPITOR) 20 MG tablet Take 1 tablet by mouth once daily 90 tablet 1   bismuth subsalicylate (PEPTO BISMOL) 262 MG/15ML suspension Take 30 mLs by mouth daily at 6 (six) AM.     Calcium  Carbonate-Vitamin D  600-400 MG-UNIT tablet Take 1 tablet by mouth daily at 12 noon.      diclofenac  Sodium (VOLTAREN ) 1 % GEL Apply 4 g topically 4 (four) times daily.     folic acid  (FOLVITE ) 1 MG tablet Take 1 mg by mouth once a week.     gabapentin  (NEURONTIN ) 100 MG capsule Take 1 capsule (100 mg total) by mouth 3 (three) times daily. (Patient taking differently: Take 300 mg by mouth. 2 times per day) 90 capsule 0   insulin  aspart (NOVOLOG ) 100 UNIT/ML FlexPen Inject 2 Units into the skin 3 (three) times daily with meals. Sliding scale 15 mL 11   Insulin  Degludec (TRESIBA ) 100 UNIT/ML SOLN Inject 4 Units into the skin at bedtime.     methocarbamol  (ROBAXIN ) 500 MG tablet Take 500 mg by mouth 4 (four) times daily.     methotrexate 50 MG/2ML injection Inject 0.4 mLs into the muscle once a week.     mirtazapine  (REMERON ) 7.5 MG tablet Take 7.5 mg by mouth at bedtime.     Multiple Vitamins-Minerals (CENTRUM SILVER  50+WOMEN PO) Take 1 tablet by mouth daily.     nitroGLYCERIN  (NITROSTAT ) 0.4 MG SL tablet Dissolve 1 tablet under the tongue every 5 minutes as needed for chest pain. Max of 3 doses, then 911. 25 tablet 3   predniSONE  (DELTASONE ) 5 MG tablet Take 1 tablet (5 mg total) by mouth daily with  breakfast. 30 tablet 0   protein supplement shake (PREMIER PROTEIN) LIQD Take 2 oz by mouth 2 (two) times daily.     pyrithione zinc  (SELSUN BLUE DRY SCALP) 1 % shampoo Apply 1 application topically once a week.     RESTASIS  0.05 % ophthalmic emulsion Place 1 drop into both eyes 2 (two) times daily.     No current facility-administered medications for this visit.    REVIEW OF SYSTEMS:  [X]  denotes positive finding, [ ]  denotes negative finding Cardiac  Comments:  Chest pain or chest pressure:    Shortness of breath upon exertion:    Short of breath when lying flat:    Irregular heart rhythm:        Vascular    Pain in calf, thigh, or hip brought on by ambulation:    Pain in feet at night that wakes you up from your sleep:     Blood clot in your veins:    Leg swelling:         Pulmonary    Oxygen  at home:    Productive cough:     Wheezing:         Neurologic    Sudden weakness in arms or legs:     Sudden numbness in arms or legs:     Sudden onset of difficulty speaking or slurred speech:    Temporary loss of vision in one eye:     Problems with dizziness:         Gastrointestinal    Blood in stool:     Vomited blood:         Genitourinary    Burning when urinating:     Blood in urine:        Psychiatric    Major depression:         Hematologic    Bleeding problems:    Problems with blood clotting too easily:        Skin    Rashes or ulcers:        Constitutional    Fever  or chills:      PHYSICAL EXAM: There were no vitals filed for this visit.  GENERAL: The patient is a well-nourished female, in no acute distress. The vital signs are documented above. CARDIAC: There is a regular rate and rhythm.  VASCULAR:  Bilateral femoral pulses palpable Left lateral calf wound as pictured - almost completely healed PULMONARY: No respiratory distress. ABDOMEN: Soft and non-tender. MUSCULOSKELETAL: There are no major deformities or cyanosis. NEUROLOGIC: No focal  weakness or paresthesias are detected. PSYCHIATRIC: The patient has a normal affect.    DATA:   ABIs 01/15/23 that were 1.23 on the right monophasic and 1.04 on the left monophasic  Assessment/Plan:  89 y.o. female, with history of diabetes, dyslipidemia, rheumatoid arthritis that presents for 4 to 6-week follow-up for wound check of left leg wound.  We previously discussed angiogram but she wanted to pursue a conservative approach as they felt the wound was healing.  Fortunately this is now almost completely healed as pictured.  This has made significant progress over the last 4 to 6 weeks.  I do not see any role for invasive intervention.  She has no other tissue loss.  She has no complaints when ambulating with her walker.  I am available as needed.  Discussed with her daughter let me know if she has any new wounds that developed.  She will follow-up with Dr. Harden.     Lonni DOROTHA Gaskins, MD [facility] Office: [phone]

## 2023-03-05 ENCOUNTER — Ambulatory Visit (INDEPENDENT_AMBULATORY_CARE_PROVIDER_SITE_OTHER): Payer: Medicare Other | Admitting: Vascular Surgery

## 2023-03-05 ENCOUNTER — Encounter: Payer: Self-pay | Admitting: Vascular Surgery

## 2023-03-05 VITALS — BP 115/71 | HR 83 | Temp 97.6°F | Resp 18 | Ht 61.0 in | Wt 120.9 lb

## 2023-03-05 DIAGNOSIS — I739 Peripheral vascular disease, unspecified: Secondary | ICD-10-CM

## 2023-03-05 LAB — CUP PACEART REMOTE DEVICE CHECK
Date Time Interrogation Session: 20241229231809
Implantable Pulse Generator Implant Date: 20240708

## 2023-03-08 DIAGNOSIS — I509 Heart failure, unspecified: Secondary | ICD-10-CM | POA: Diagnosis not present

## 2023-03-08 DIAGNOSIS — N1832 Chronic kidney disease, stage 3b: Secondary | ICD-10-CM | POA: Diagnosis not present

## 2023-03-08 DIAGNOSIS — I130 Hypertensive heart and chronic kidney disease with heart failure and stage 1 through stage 4 chronic kidney disease, or unspecified chronic kidney disease: Secondary | ICD-10-CM | POA: Diagnosis not present

## 2023-03-08 DIAGNOSIS — S81802D Unspecified open wound, left lower leg, subsequent encounter: Secondary | ICD-10-CM | POA: Diagnosis not present

## 2023-03-08 DIAGNOSIS — M170 Bilateral primary osteoarthritis of knee: Secondary | ICD-10-CM | POA: Diagnosis not present

## 2023-03-08 DIAGNOSIS — E1122 Type 2 diabetes mellitus with diabetic chronic kidney disease: Secondary | ICD-10-CM | POA: Diagnosis not present

## 2023-03-11 ENCOUNTER — Ambulatory Visit: Payer: Medicare Other | Attending: Internal Medicine | Admitting: Internal Medicine

## 2023-03-11 ENCOUNTER — Encounter: Payer: Self-pay | Admitting: Internal Medicine

## 2023-03-11 ENCOUNTER — Ambulatory Visit: Payer: Medicare Other | Admitting: Podiatry

## 2023-03-11 VITALS — BP 138/66 | HR 73 | Ht 61.0 in | Wt 122.8 lb

## 2023-03-11 DIAGNOSIS — I4819 Other persistent atrial fibrillation: Secondary | ICD-10-CM | POA: Diagnosis not present

## 2023-03-11 DIAGNOSIS — I10 Essential (primary) hypertension: Secondary | ICD-10-CM | POA: Diagnosis not present

## 2023-03-11 DIAGNOSIS — E039 Hypothyroidism, unspecified: Secondary | ICD-10-CM

## 2023-03-11 DIAGNOSIS — Z79899 Other long term (current) drug therapy: Secondary | ICD-10-CM

## 2023-03-11 NOTE — Patient Instructions (Signed)
 Medication Instructions:  Your physician recommends that you continue on your current medications as directed. Please refer to the Current Medication list given to you today.  *If you need a refill on your cardiac medications before your next appointment, please call your pharmacy*   Lab Work: TSH and BMET today If you have labs (blood work) drawn today and your tests are completely normal, you will receive your results only by: MyChart Message (if you have MyChart) OR A paper copy in the mail If you have any lab test that is abnormal or we need to change your treatment, we will call you to review the results.   Testing/Procedures: None ordered.    Follow-Up: At [facility], you and your health needs are our priority.  As part of our continuing mission to provide you with exceptional heart care, we have created designated Provider Care Teams.  These Care Teams include your primary Cardiologist (physician) and Advanced Practice Providers (APPs -  Physician Assistants and Nurse Practitioners) who all work together to provide you with the care you need, when you need it.  We recommend signing up for the patient portal called MyChart.  Sign up information is provided on this After Visit Summary.  MyChart is used to connect with patients for Virtual Visits (Telemedicine).  Patients are able to view lab/test results, encounter notes, upcoming appointments, etc.  Non-urgent messages can be sent to your provider as well.   To learn more about what you can do with MyChart, go to [email or web address].    Your next appointment:   6 months with Dr Fernande

## 2023-03-11 NOTE — Progress Notes (Signed)
 Patient Care Team: Nichole Senior, MD as PCP - General (Endocrinology) Nahser, Aleene PARAS, MD as PCP - Cardiology (Cardiology) Fernande Elspeth BROCKS, MD as PCP - Electrophysiology (Clinical Cardiac Electrophysiology) Ezzard Rolin BIRCH, LCSW as Social Worker (Licensed Clinical Social Worker)   HPI  Shelby Walker is a 89 y.o. female seen in followup for pacemaker implanted [employer] DOI 11/23 for loop recorder (inserted for atrial fibrillation monitoring) documentation of a  25-second sinus pause--subsequently explanted following MSSA bacteremia which was also associated with concerns of lumbar periostitis without osteomyelitis.  Decision to reimplant was deferred as despite the pause she had no syncope and she did not want it.  Admitted 6/24 with gallstone pancreatitis with  ascending cholangitis and Enterobacter sepsis and underwent laparoscopic cholecystectomy \ Chronic steroid secondary to rheumatoid arthritis  Tolerating amio  No interval AFib off ILR 12/24  Pains are life challenging        DATE TEST EF    10/17 Myoview   77 %    5/20 Echo  60-65 %    8/21 Echo 60-65%     11/23 Echo  55%        Date Cr K Hgb TSH LFTs  12/22 1.25 4.2 8.3    2/24         6/24 1.43 3.2 7.9 1.5    12.24  5.3   19     Records and Results Reviewed   Past Medical History:  Diagnosis Date   Bruit    Carotid Doppler showed no significant abnormality     9per patient)   C. difficile colitis 07/2018   with severe sepsis   Chest pain, unspecified    Nuclear, May, 2008, no scar or ischemia   Diabetes mellitus    Diverticulosis    Dyslipidemia    Ejection fraction    EF 55-60%, echo, February, 2011 // Echocardiogram 8/21: EF 60-65, no RWMA, Gr 1 DD, GLS -14%, normal RVSF, mild LAE, trivial MR, mild MS (mean 4 mmHg), RVSP 23.4    GERD (gastroesophageal reflux disease)    Mitral regurgitation 04/21/2009   mild,  echo, February, 2011   Osteoporosis    Palpitations    possible very brief  atrial fibrillation on monitor and possible reentrant tachycardia   PONV (postoperative nausea and vomiting)    Psoriasis    Rheumatoid arthritis (HCC)     Past Surgical History:  Procedure Laterality Date   ABDOMINAL AORTOGRAM W/LOWER EXTREMITY Right 10/19/2020   Procedure: ABDOMINAL AORTOGRAM W/LOWER EXTREMITY;  Surgeon: Darron Deatrice LABOR, MD;  Location: [facility];  Service: Cardiovascular;  Laterality: Right;   CHOLECYSTECTOMY N/A 08/09/2022   Procedure: LAPAROSCOPIC CHOLECYSTECTOMY WITH INTRAOPERATIVE CHOLANGIOGRAM;  Surgeon: Lyndel Deward PARAS, MD;  Location: [facility] OR;  Service: General;  Laterality: N/A;   FRACTURE SURGERY     LOOP RECORDER REMOVAL N/A 01/05/2022   Procedure: LOOP RECORDER REMOVAL;  Surgeon: Waddell Danelle ORN, MD;  Location: [facility];  Service: Cardiovascular;  Laterality: N/A;   PACEMAKER IMPLANT N/A 01/05/2022   Procedure: PACEMAKER IMPLANT;  Surgeon: Waddell Danelle ORN, MD;  Location: [facility];  Service: Cardiovascular;  Laterality: N/A;   PPM GENERATOR REMOVAL N/A 01/29/2022   Procedure: PPM GENERATOR REMOVAL;  Surgeon: Waddell Danelle ORN, MD;  Location: [facility];  Service: Cardiovascular;  Laterality: N/A;   TEE WITHOUT CARDIOVERSION N/A 01/29/2022   Procedure: TRANSESOPHAGEAL ECHOCARDIOGRAM (TEE);  Surgeon: Barbaraann Darryle Ned, MD;  Location:  [facility]OS[facility];  Service: Cardiovascular;  Laterality: N/A;   TRANSMETATARSAL AMPUTATION Right 01/18/2021   Procedure: TRANSMETATARSAL AMPUTATION;  Surgeon: Kit Rush, MD;  Location: [facility] OR;  Service: Orthopedics;  Laterality: Right;    Current Meds  Medication Sig   acetaminophen  (TYLENOL ) 500 MG tablet Take 1 tablet (500 mg total) by mouth every 8 (eight) hours as needed.   amiodarone  (PACERONE ) 200 MG tablet Take 0.5 tablets (100 mg total) by mouth daily.   atorvastatin  (LIPITOR) 20 MG tablet Take 1 tablet by mouth once daily   bismuth subsalicylate (PEPTO BISMOL) 262 MG/15ML suspension  Take 30 mLs by mouth daily at 6 (six) AM.   Calcium  Carbonate-Vitamin D 600-400 MG-UNIT tablet Take 1 tablet by mouth daily at 12 noon.    diclofenac  Sodium (VOLTAREN ) 1 % GEL Apply 4 g topically 4 (four) times daily.   folic acid  (FOLVITE ) 1 MG tablet Take 1 mg by mouth once a week.   insulin  aspart (NOVOLOG ) 100 UNIT/ML FlexPen Inject 2 Units into the skin 3 (three) times daily with meals. Sliding scale   Insulin  Degludec (TRESIBA ) 100 UNIT/ML SOLN Inject 4 Units into the skin at bedtime.   methocarbamol  (ROBAXIN ) 500 MG tablet Take 500 mg by mouth as needed.   methotrexate 50 MG/2ML injection Inject 0.4 mLs into the muscle once a week.   mirtazapine  (REMERON ) 7.5 MG tablet Take 7.5 mg by mouth at bedtime.   Multiple Vitamins-Minerals (CENTRUM SILVER  50+WOMEN PO) Take 1 tablet by mouth daily.   nitroGLYCERIN  (NITROSTAT ) 0.4 MG SL tablet Dissolve 1 tablet under the tongue every 5 minutes as needed for chest pain. Max of 3 doses, then 911.   predniSONE  (DELTASONE ) 5 MG tablet Take 1 tablet (5 mg total) by mouth daily with breakfast.   pregabalin (LYRICA) 50 MG capsule Take 50 mg by mouth in the morning.   pregabalin (LYRICA) 75 MG capsule Take 75 mg by mouth at bedtime.   protein supplement shake (PREMIER PROTEIN) LIQD Take 2 oz by mouth 2 (two) times daily.   pyrithione zinc  (SELSUN BLUE DRY SCALP) 1 % shampoo Apply 1 application topically once a week.   RESTASIS  0.05 % ophthalmic emulsion Place 1 drop into both eyes 2 (two) times daily.   [DISCONTINUED] gabapentin  (NEURONTIN ) 100 MG capsule Take 1 capsule (100 mg total) by mouth 3 (three) times daily. (Patient taking differently: Take 300 mg by mouth. 2 times per day)    Allergies  Allergen Reactions   Cholestyramine      Possible- Makes throat burn. Patient said she can't take it    Compazine [Prochlorperazine Edisylate] Swelling    REACTION:  tongue swell and unable to swallow   Celecoxib Diarrhea   Sulfonamide Derivatives Other (See  Comments)    REACTION:  broke out with fine itching bumps   Hydrocodone  Other (See Comments)   Infliximab Other (See Comments)    Other reaction(s): rash   Leflunomide Other (See Comments)    Other reaction(s): diarrhea   Prochlorperazine Other (See Comments)    Other reaction(s): Unknown  Other Reaction(s): tongue swells   Rosuvastatin     Other reaction(s): muscle aches   Sulfa Antibiotics Other (See Comments)    Other reaction(s): tongue swelling      Review of Systems negative except from HPI and PMH  Physical Exam BP 138/66   Pulse 73   Ht 5' 1 (1.549 m)   Wt 122 lb 12.8 oz (55.7 kg)   SpO2 99%   BMI  23.20 kg/m  Well developed and nourished in no acute distress HENT normal Neck supple with JVP-flat while seated Clear Regular rate and rhythm, 2/6 murmur split S2 Abd-soft with active BS No Clubbing cyanosis edema Skin-warm and dry A & Oriented  Grossly normal sensory and motor function  ECG sinus @ 73 17/08/39 like a tornado this morning   Assessment and  Plan Atrial fibrillation-persistent   Fall    Peripheral vascular disease with prior revascularization  Sinus pause 25 seconds  Atrial tachycardia noted in the hospital  Recorder insertion and removal, pacemaker implantation and removal secondary to MSSA bacteremia  Hypertension  Anemia      No interval Afib; continue low dose amio for the speculative benefit that less afib decreases CVA risk in this LOL with no anticoagulation   No interval fall  BP well controlled

## 2023-03-12 LAB — BASIC METABOLIC PANEL
BUN/Creatinine Ratio: 37 — ABNORMAL HIGH (ref 12–28)
BUN: 54 mg/dL — ABNORMAL HIGH (ref 8–27)
CO2: 24 mmol/L (ref 20–29)
Calcium: 8.3 mg/dL — ABNORMAL LOW (ref 8.7–10.3)
Chloride: 111 mmol/L — ABNORMAL HIGH (ref 96–106)
Creatinine, Ser: 1.46 mg/dL — ABNORMAL HIGH (ref 0.57–1.00)
Glucose: 259 mg/dL — ABNORMAL HIGH (ref 70–99)
Potassium: 4.5 mmol/L (ref 3.5–5.2)
Sodium: 148 mmol/L — ABNORMAL HIGH (ref 134–144)
eGFR: 35 mL/min/{1.73_m2} — ABNORMAL LOW (ref >59)

## 2023-03-12 LAB — TSH: TSH: 0.946 u[IU]/mL (ref 0.450–4.500)

## 2023-03-13 DIAGNOSIS — K219 Gastro-esophageal reflux disease without esophagitis: Secondary | ICD-10-CM | POA: Diagnosis not present

## 2023-03-13 DIAGNOSIS — E1151 Type 2 diabetes mellitus with diabetic peripheral angiopathy without gangrene: Secondary | ICD-10-CM | POA: Diagnosis not present

## 2023-03-13 DIAGNOSIS — I509 Heart failure, unspecified: Secondary | ICD-10-CM | POA: Diagnosis not present

## 2023-03-13 DIAGNOSIS — S81802D Unspecified open wound, left lower leg, subsequent encounter: Secondary | ICD-10-CM | POA: Diagnosis not present

## 2023-03-13 DIAGNOSIS — E1142 Type 2 diabetes mellitus with diabetic polyneuropathy: Secondary | ICD-10-CM | POA: Diagnosis not present

## 2023-03-13 DIAGNOSIS — M47816 Spondylosis without myelopathy or radiculopathy, lumbar region: Secondary | ICD-10-CM | POA: Diagnosis not present

## 2023-03-13 DIAGNOSIS — L409 Psoriasis, unspecified: Secondary | ICD-10-CM | POA: Diagnosis not present

## 2023-03-13 DIAGNOSIS — I050 Rheumatic mitral stenosis: Secondary | ICD-10-CM | POA: Diagnosis not present

## 2023-03-13 DIAGNOSIS — Z7984 Long term (current) use of oral hypoglycemic drugs: Secondary | ICD-10-CM | POA: Diagnosis not present

## 2023-03-13 DIAGNOSIS — N281 Cyst of kidney, acquired: Secondary | ICD-10-CM | POA: Diagnosis not present

## 2023-03-13 DIAGNOSIS — I890 Lymphedema, not elsewhere classified: Secondary | ICD-10-CM | POA: Diagnosis not present

## 2023-03-13 DIAGNOSIS — I480 Paroxysmal atrial fibrillation: Secondary | ICD-10-CM | POA: Diagnosis not present

## 2023-03-13 DIAGNOSIS — E785 Hyperlipidemia, unspecified: Secondary | ICD-10-CM | POA: Diagnosis not present

## 2023-03-13 DIAGNOSIS — M810 Age-related osteoporosis without current pathological fracture: Secondary | ICD-10-CM | POA: Diagnosis not present

## 2023-03-13 DIAGNOSIS — N1832 Chronic kidney disease, stage 3b: Secondary | ICD-10-CM | POA: Diagnosis not present

## 2023-03-13 DIAGNOSIS — M069 Rheumatoid arthritis, unspecified: Secondary | ICD-10-CM | POA: Diagnosis not present

## 2023-03-13 DIAGNOSIS — I051 Rheumatic mitral insufficiency: Secondary | ICD-10-CM | POA: Diagnosis not present

## 2023-03-13 DIAGNOSIS — H9193 Unspecified hearing loss, bilateral: Secondary | ICD-10-CM | POA: Diagnosis not present

## 2023-03-13 DIAGNOSIS — K5730 Diverticulosis of large intestine without perforation or abscess without bleeding: Secondary | ICD-10-CM | POA: Diagnosis not present

## 2023-03-13 DIAGNOSIS — M170 Bilateral primary osteoarthritis of knee: Secondary | ICD-10-CM | POA: Diagnosis not present

## 2023-03-13 DIAGNOSIS — E875 Hyperkalemia: Secondary | ICD-10-CM | POA: Diagnosis not present

## 2023-03-13 DIAGNOSIS — Z7952 Long term (current) use of systemic steroids: Secondary | ICD-10-CM | POA: Diagnosis not present

## 2023-03-13 DIAGNOSIS — I130 Hypertensive heart and chronic kidney disease with heart failure and stage 1 through stage 4 chronic kidney disease, or unspecified chronic kidney disease: Secondary | ICD-10-CM | POA: Diagnosis not present

## 2023-03-13 DIAGNOSIS — Z794 Long term (current) use of insulin: Secondary | ICD-10-CM | POA: Diagnosis not present

## 2023-03-13 DIAGNOSIS — E1122 Type 2 diabetes mellitus with diabetic chronic kidney disease: Secondary | ICD-10-CM | POA: Diagnosis not present

## 2023-03-14 DIAGNOSIS — S81802D Unspecified open wound, left lower leg, subsequent encounter: Secondary | ICD-10-CM | POA: Diagnosis not present

## 2023-03-14 DIAGNOSIS — I130 Hypertensive heart and chronic kidney disease with heart failure and stage 1 through stage 4 chronic kidney disease, or unspecified chronic kidney disease: Secondary | ICD-10-CM | POA: Diagnosis not present

## 2023-03-14 DIAGNOSIS — E1122 Type 2 diabetes mellitus with diabetic chronic kidney disease: Secondary | ICD-10-CM | POA: Diagnosis not present

## 2023-03-14 DIAGNOSIS — N1832 Chronic kidney disease, stage 3b: Secondary | ICD-10-CM | POA: Diagnosis not present

## 2023-03-14 DIAGNOSIS — M170 Bilateral primary osteoarthritis of knee: Secondary | ICD-10-CM | POA: Diagnosis not present

## 2023-03-14 DIAGNOSIS — I509 Heart failure, unspecified: Secondary | ICD-10-CM | POA: Diagnosis not present

## 2023-03-18 ENCOUNTER — Encounter: Payer: Self-pay | Admitting: Orthopedic Surgery

## 2023-03-18 ENCOUNTER — Ambulatory Visit (INDEPENDENT_AMBULATORY_CARE_PROVIDER_SITE_OTHER): Payer: Medicare Other | Admitting: Orthopedic Surgery

## 2023-03-18 DIAGNOSIS — S81802D Unspecified open wound, left lower leg, subsequent encounter: Secondary | ICD-10-CM | POA: Diagnosis not present

## 2023-03-18 DIAGNOSIS — I87332 Chronic venous hypertension (idiopathic) with ulcer and inflammation of left lower extremity: Secondary | ICD-10-CM

## 2023-03-18 NOTE — Progress Notes (Signed)
 Office Visit Note   Patient: Shelby Walker           Date of Birth: 1934/02/21           MRN: [id number] Visit Date: 03/18/2023              Requested by: Nichole Senior, MD [address] [place],  [place] [address] PCP: Nichole Senior, MD  Chief Complaint  Patient presents with   Left Leg - Follow-up      HPI: Patient is an 89 year old woman who is seen in follow-up for venous ulceration left lower extremity status post Kerecis tissue graft.  She is currently wearing the Vive sock.  Patient states she feels well and has no concerns.  Assessment & Plan: Visit Diagnoses:  1. Wound of left lower extremity, subsequent encounter   2. Chronic venous hypertension (idiopathic) with ulcer and inflammation of left lower extremity (HCC)     Plan: Continue with the compression sock.  Reevaluate in 4 weeks.  Follow-Up Instructions: Return in about 4 weeks (around 04/15/2023).   Ortho Exam  Patient is alert, oriented, no adenopathy, well-dressed, normal affect, normal respiratory effort. Examination patient has venous insufficiency changes but no cellulitis no drainage.  The wound bed is flat with healthy granulation tissue measures 1 cm in diameter.  Imaging: No results found.   Labs: Lab Results  Component Value Date   HGBA1C 9.2 (H) 08/08/2022   HGBA1C 7.3 (H) 01/24/2022   HGBA1C 7.7 (H) 01/05/2022   ESRSEDRATE 31 (H) 05/01/2022   ESRSEDRATE 22 03/27/2022   ESRSEDRATE 18 02/09/2022   CRP 8.0 (H) 08/12/2022   CRP 8.1 (H) 08/11/2022   CRP 8.5 (H) 08/10/2022   REPTSTATUS 08/10/2022 FINAL 08/07/2022   CULT (A) 08/07/2022    ENTEROBACTER CLOACAE SUSCEPTIBILITIES PERFORMED ON PREVIOUS CULTURE WITHIN THE LAST 5 DAYS. Performed at [facility], [address], [place], [place] [address]    LABORGA ENTEROBACTER CLOACAE 08/07/2022     Lab Results  Component Value Date   ALBUMIN  3.9 09/10/2022   ALBUMIN  1.9 (L) 08/12/2022   ALBUMIN  2.2 (L) 08/11/2022   PREALBUMIN 18  02/05/2022   PREALBUMIN 15.2 (L) 01/16/2021    Lab Results  Component Value Date   MG 2.3 08/12/2022   MG 2.3 08/11/2022   MG 2.3 08/10/2022   No results found for: VD25OH  Lab Results  Component Value Date   PREALBUMIN 18 02/05/2022   PREALBUMIN 15.2 (L) 01/16/2021      Latest Ref Rng & Units 09/10/2022   10:06 AM 08/12/2022    4:15 AM 08/11/2022    2:06 AM  CBC EXTENDED  WBC 3.4 - 10.8 x10E3/uL 12.6  6.6  5.9   RBC 3.77 - 5.28 x10E6/uL 3.14  2.61  2.65   Hemoglobin 11.1 - 15.9 g/dL 9.1  7.9  7.9   HCT 65.9 - 46.6 % 28.4  24.4  24.5   Platelets 150 - 450 x10E3/uL 236  90  104   NEUT# 1.7 - 7.7 K/uL  5.1  5.3   Lymph# 0.7 - 4.0 K/uL  0.9  0.4      There is no height or weight on file to calculate BMI.  Orders:  No orders of the defined types were placed in this encounter.  No orders of the defined types were placed in this encounter.    Procedures: No procedures performed  Clinical Data: No additional findings.  ROS:  All other systems negative, except as  noted in the HPI. Review of Systems  Objective: Vital Signs: There were no vitals taken for this visit.  Specialty Comments:  EXAM: MRI LUMBAR SPINE WITHOUT CONTRAST   TECHNIQUE: Multiplanar, multisequence MR imaging of the lumbar spine was performed. No intravenous contrast was administered.   COMPARISON:  RADIOGRAPHY OF THE LUMBAR DATED 11/16/2020. RADIOGRAPHY OF THE LUMBAR DATED 08/07/2018.   FINDINGS: Segmentation:  Standard.   Alignment: Grade 1 anterolisthesis of L3 on L4, L4 on L5 and L5-S1 secondary to facet disease.   Vertebrae: No acute fracture, evidence of discitis, or aggressive bone lesion.   Conus medullaris and cauda equina: Conus extends to the L1-2 level. Conus and cauda equina appear normal.   Paraspinal and other soft tissues: No acute paraspinal abnormality. Right renal cyst.   Disc levels:   Disc spaces: Degenerative disease with disc height loss at L3-4, L4-5 and  L5-S1.   T12-L1: Minimal broad-based disc bulge. No foraminal or central canal stenosis.   L1-L2: No significant disc bulge. No neural foraminal stenosis. No central canal stenosis.   L2-L3: Minimal broad-based disc bulge. Mild bilateral facet arthropathy. No foraminal or central canal stenosis.   L3-L4: Broad-based disc bulge with a broad left paracentral disc protrusion with mass effect on the left intraspinal L4 nerve root. Moderate bilateral facet arthropathy. Moderate-severe spinal stenosis. Left subarticular recess stenosis. Moderate left foraminal stenosis. Mild right foraminal stenosis.   L4-L5: Broad-based disc bulge with a broad central disc protrusion. Severe bilateral facet arthropathy with ligamentum flavum infolding. Severe spinal stenosis. Mild right and severe left foraminal stenosis.   L5-S1: Mild broad-based disc bulge with a broad shallow right paracentral disc protrusion with mass effect on the right intraspinal S1 nerve root. Mild bilateral foraminal stenosis. Mild bilateral facet arthropathy. No spinal stenosis.   IMPRESSION: 1. Lumbar spine spondylosis as described above most severe at L3-4, L4-5 and L5-S1. 2. No acute osseous injury of the lumbar spine.     Electronically Signed   By: Julaine Blanch M.D.   On: 11/23/2020 08:11  PMFS History: Patient Active Problem List   Diagnosis Date Noted   Acute biliary pancreatitis 08/07/2022   Sepsis (HCC) 08/07/2022   Sacral wound 05/14/2022   Coronary artery disease involving native coronary artery of native heart without angina pectoris 02/21/2022   Discitis of lumbar region 02/05/2022   Herpes zoster without complication 02/05/2022   Clostridium difficile colitis 02/05/2022   Severe sepsis with acute organ dysfunction due to Gram positive bacteria (HCC) 02/02/2022   Pacemaker infection (HCC) 02/01/2022   Periostitis of lumbar spine without osteomyelitis (HCC) 02/01/2022   Acute midline low back pain  without sciatica 01/26/2022   MSSA bacteremia 01/25/2022   Presence of cardiac pacemaker 01/25/2022   Severe sepsis with septic shock (HCC) 01/24/2022   AKI (acute kidney injury) (HCC) 01/24/2022   Sinus pause 01/04/2022   Acute viral bronchitis 02/06/2021   Acute blood loss anemia    Postoperative pain    S/P transmetatarsal amputation of foot, right (HCC) 01/24/2021   Protein-calorie malnutrition, severe 01/17/2021   Diabetic foot infection (HCC) 01/16/2021   Rheumatoid arthritis (HCC) 01/16/2021   Stage 3b chronic kidney disease (CKD) (HCC) 01/16/2021   DNR (do not resuscitate) 01/16/2021   Decreased anal sphincter tone 12/13/2018   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   Irritable bowel syndrome with diarrhea - post infectious 12/12/2018   Full incontinence of feces and fecal smearing 12/12/2018   Perianal dermatitis 12/12/2018   Uncontrolled type 2 diabetes mellitus  with hyperglycemia (HCC) 10/26/2018   Long-term use of immunosuppressant medication-Enbrel 10/26/2018   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 08/27/2018   Thrombocytopenia (HCC)    Essential hypertension    Dyspnea on exertion    Steroid-induced hyperglycemia    Fatigue 08/06/2018   Paroxysmal atrial fibrillation (HCC)    Mixed hyperlipidemia 09/29/2016   Stable angina (HCC) 09/29/2016   PAD (peripheral artery disease) (HCC) 08/09/2016   Preoperative clearance 05/21/2012   Precordial chest pain    Palpitations    Diabetes mellitus (HCC)    GERD (gastroesophageal reflux disease)    Ejection fraction    Carotid artery disease (HCC)    Mitral regurgitation 04/21/2009   PSORIASIS 02/09/2009   Rheumatoid arthritis flare (HCC) 02/09/2009   Past Medical History:  Diagnosis Date   Bruit    Carotid Doppler showed no significant abnormality     9per patient)   C. difficile colitis 07/2018   with severe sepsis   Chest pain, unspecified    Nuclear, May, 2008, no scar or ischemia   Diabetes mellitus     Diverticulosis    Dyslipidemia    Ejection fraction    EF 55-60%, echo, February, 2011 // Echocardiogram 8/21: EF 60-65, no RWMA, Gr 1 DD, GLS -14%, normal RVSF, mild LAE, trivial MR, mild MS (mean 4 mmHg), RVSP 23.4    GERD (gastroesophageal reflux disease)    Mitral regurgitation 04/21/2009   mild,  echo, February, 2011   Osteoporosis    Palpitations    possible very brief atrial fibrillation on monitor and possible reentrant tachycardia   PONV (postoperative nausea and vomiting)    Psoriasis    Rheumatoid arthritis (HCC)     Family History  Problem Relation Age of Onset   Heart failure Father    Heart attack Father    Diabetes Father    Heart attack Mother    Heart failure Mother    Diabetes Mother    Stroke Sister     Past Surgical History:  Procedure Laterality Date   ABDOMINAL AORTOGRAM W/LOWER EXTREMITY Right 10/19/2020   Procedure: ABDOMINAL AORTOGRAM W/LOWER EXTREMITY;  Surgeon: Darron Deatrice LABOR, MD;  Location: [facility];  Service: Cardiovascular;  Laterality: Right;   CHOLECYSTECTOMY N/A 08/09/2022   Procedure: LAPAROSCOPIC CHOLECYSTECTOMY WITH INTRAOPERATIVE CHOLANGIOGRAM;  Surgeon: Lyndel Deward PARAS, MD;  Location: [facility] OR;  Service: General;  Laterality: N/A;   FRACTURE SURGERY     LOOP RECORDER REMOVAL N/A 01/05/2022   Procedure: LOOP RECORDER REMOVAL;  Surgeon: Waddell Danelle ORN, MD;  Location: [facility];  Service: Cardiovascular;  Laterality: N/A;   PACEMAKER IMPLANT N/A 01/05/2022   Procedure: PACEMAKER IMPLANT;  Surgeon: Waddell Danelle ORN, MD;  Location: [facility];  Service: Cardiovascular;  Laterality: N/A;   PPM GENERATOR REMOVAL N/A 01/29/2022   Procedure: PPM GENERATOR REMOVAL;  Surgeon: Waddell Danelle ORN, MD;  Location: [facility];  Service: Cardiovascular;  Laterality: N/A;   TEE WITHOUT CARDIOVERSION N/A 01/29/2022   Procedure: TRANSESOPHAGEAL ECHOCARDIOGRAM (TEE);  Surgeon: Barbaraann Darryle Ned, MD;  Location: [facility] ENDOS[facility];   Service: Cardiovascular;  Laterality: N/A;   TRANSMETATARSAL AMPUTATION Right 01/18/2021   Procedure: TRANSMETATARSAL AMPUTATION;  Surgeon: Kit Rush, MD;  Location: [facility] OR;  Service: Orthopedics;  Laterality: Right;   Social History   Occupational History    Employer: RETIRED  Tobacco Use   Smoking status: Never   Smokeless tobacco: Never  Vaping Use   Vaping status: Never Used  Substance and Sexual Activity   Alcohol  use: No    Alcohol /week: 0.0 standard drinks of alcohol    Drug use: No   Sexual activity: Yes    Birth control/protection: Post-menopausal

## 2023-03-19 DIAGNOSIS — M170 Bilateral primary osteoarthritis of knee: Secondary | ICD-10-CM | POA: Diagnosis not present

## 2023-03-19 DIAGNOSIS — N1832 Chronic kidney disease, stage 3b: Secondary | ICD-10-CM | POA: Diagnosis not present

## 2023-03-19 DIAGNOSIS — S81802D Unspecified open wound, left lower leg, subsequent encounter: Secondary | ICD-10-CM | POA: Diagnosis not present

## 2023-03-19 DIAGNOSIS — I130 Hypertensive heart and chronic kidney disease with heart failure and stage 1 through stage 4 chronic kidney disease, or unspecified chronic kidney disease: Secondary | ICD-10-CM | POA: Diagnosis not present

## 2023-03-19 DIAGNOSIS — I509 Heart failure, unspecified: Secondary | ICD-10-CM | POA: Diagnosis not present

## 2023-03-19 DIAGNOSIS — E1122 Type 2 diabetes mellitus with diabetic chronic kidney disease: Secondary | ICD-10-CM | POA: Diagnosis not present

## 2023-03-20 DIAGNOSIS — M1991 Primary osteoarthritis, unspecified site: Secondary | ICD-10-CM | POA: Diagnosis not present

## 2023-03-20 DIAGNOSIS — M25562 Pain in left knee: Secondary | ICD-10-CM | POA: Diagnosis not present

## 2023-03-20 DIAGNOSIS — Z6823 Body mass index (BMI) 23.0-23.9, adult: Secondary | ICD-10-CM | POA: Diagnosis not present

## 2023-03-20 DIAGNOSIS — M7062 Trochanteric bursitis, left hip: Secondary | ICD-10-CM | POA: Diagnosis not present

## 2023-03-20 DIAGNOSIS — M810 Age-related osteoporosis without current pathological fracture: Secondary | ICD-10-CM | POA: Diagnosis not present

## 2023-03-20 DIAGNOSIS — Z79899 Other long term (current) drug therapy: Secondary | ICD-10-CM | POA: Diagnosis not present

## 2023-03-20 DIAGNOSIS — L409 Psoriasis, unspecified: Secondary | ICD-10-CM | POA: Diagnosis not present

## 2023-03-20 DIAGNOSIS — S91109A Unspecified open wound of unspecified toe(s) without damage to nail, initial encounter: Secondary | ICD-10-CM | POA: Diagnosis not present

## 2023-03-20 DIAGNOSIS — N1832 Chronic kidney disease, stage 3b: Secondary | ICD-10-CM | POA: Diagnosis not present

## 2023-03-20 DIAGNOSIS — M069 Rheumatoid arthritis, unspecified: Secondary | ICD-10-CM | POA: Diagnosis not present

## 2023-03-20 DIAGNOSIS — M5136 Other intervertebral disc degeneration, lumbar region with discogenic back pain only: Secondary | ICD-10-CM | POA: Diagnosis not present

## 2023-03-25 DIAGNOSIS — E1140 Type 2 diabetes mellitus with diabetic neuropathy, unspecified: Secondary | ICD-10-CM | POA: Diagnosis not present

## 2023-03-25 DIAGNOSIS — M810 Age-related osteoporosis without current pathological fracture: Secondary | ICD-10-CM | POA: Diagnosis not present

## 2023-03-25 DIAGNOSIS — N1832 Chronic kidney disease, stage 3b: Secondary | ICD-10-CM | POA: Diagnosis not present

## 2023-03-25 DIAGNOSIS — M5416 Radiculopathy, lumbar region: Secondary | ICD-10-CM | POA: Diagnosis not present

## 2023-03-25 DIAGNOSIS — M545 Low back pain, unspecified: Secondary | ICD-10-CM | POA: Diagnosis not present

## 2023-03-28 DIAGNOSIS — S81802D Unspecified open wound, left lower leg, subsequent encounter: Secondary | ICD-10-CM | POA: Diagnosis not present

## 2023-03-28 DIAGNOSIS — M170 Bilateral primary osteoarthritis of knee: Secondary | ICD-10-CM | POA: Diagnosis not present

## 2023-03-28 DIAGNOSIS — R1030 Lower abdominal pain, unspecified: Secondary | ICD-10-CM | POA: Diagnosis not present

## 2023-03-28 DIAGNOSIS — I509 Heart failure, unspecified: Secondary | ICD-10-CM | POA: Diagnosis not present

## 2023-03-28 DIAGNOSIS — K5900 Constipation, unspecified: Secondary | ICD-10-CM | POA: Diagnosis not present

## 2023-03-28 DIAGNOSIS — E1122 Type 2 diabetes mellitus with diabetic chronic kidney disease: Secondary | ICD-10-CM | POA: Diagnosis not present

## 2023-03-28 DIAGNOSIS — N1832 Chronic kidney disease, stage 3b: Secondary | ICD-10-CM | POA: Diagnosis not present

## 2023-03-28 DIAGNOSIS — M5127 Other intervertebral disc displacement, lumbosacral region: Secondary | ICD-10-CM | POA: Diagnosis not present

## 2023-03-28 DIAGNOSIS — I130 Hypertensive heart and chronic kidney disease with heart failure and stage 1 through stage 4 chronic kidney disease, or unspecified chronic kidney disease: Secondary | ICD-10-CM | POA: Diagnosis not present

## 2023-03-28 DIAGNOSIS — M545 Low back pain, unspecified: Secondary | ICD-10-CM | POA: Diagnosis not present

## 2023-03-28 DIAGNOSIS — S32050A Wedge compression fracture of fifth lumbar vertebra, initial encounter for closed fracture: Secondary | ICD-10-CM | POA: Diagnosis not present

## 2023-04-03 ENCOUNTER — Inpatient Hospital Stay (HOSPITAL_COMMUNITY)
Admission: EM | Admit: 2023-04-03 | Discharge: 2023-04-07 | DRG: 445 | Disposition: A | Discharge status: Home Health | Payer: Medicare Other | Attending: Internal Medicine | Admitting: Internal Medicine

## 2023-04-03 ENCOUNTER — Ambulatory Visit
Admission: RE | Admit: 2023-04-03 | Discharge: 2023-04-03 | Disposition: A | Discharge status: Home / Self Care | Payer: Medicare Other | Source: Ambulatory Visit | Attending: Endocrinology | Admitting: Endocrinology

## 2023-04-03 ENCOUNTER — Other Ambulatory Visit: Payer: Self-pay

## 2023-04-03 ENCOUNTER — Ambulatory Visit: Payer: Medicare Other | Admitting: Podiatry

## 2023-04-03 ENCOUNTER — Other Ambulatory Visit: Payer: Self-pay | Admitting: Endocrinology

## 2023-04-03 ENCOUNTER — Encounter: Payer: Self-pay | Admitting: Internal Medicine

## 2023-04-03 DIAGNOSIS — I2510 Atherosclerotic heart disease of native coronary artery without angina pectoris: Secondary | ICD-10-CM | POA: Diagnosis not present

## 2023-04-03 DIAGNOSIS — I739 Peripheral vascular disease, unspecified: Secondary | ICD-10-CM | POA: Diagnosis present

## 2023-04-03 DIAGNOSIS — E119 Type 2 diabetes mellitus without complications: Secondary | ICD-10-CM

## 2023-04-03 DIAGNOSIS — I700 Atherosclerosis of aorta: Secondary | ICD-10-CM | POA: Diagnosis not present

## 2023-04-03 DIAGNOSIS — R7989 Other specified abnormal findings of blood chemistry: Secondary | ICD-10-CM | POA: Diagnosis not present

## 2023-04-03 DIAGNOSIS — D631 Anemia in chronic kidney disease: Secondary | ICD-10-CM | POA: Diagnosis present

## 2023-04-03 DIAGNOSIS — R1030 Lower abdominal pain, unspecified: Secondary | ICD-10-CM

## 2023-04-03 DIAGNOSIS — M810 Age-related osteoporosis without current pathological fracture: Secondary | ICD-10-CM | POA: Diagnosis present

## 2023-04-03 DIAGNOSIS — E1151 Type 2 diabetes mellitus with diabetic peripheral angiopathy without gangrene: Secondary | ICD-10-CM | POA: Diagnosis present

## 2023-04-03 DIAGNOSIS — Z9049 Acquired absence of other specified parts of digestive tract: Secondary | ICD-10-CM | POA: Diagnosis not present

## 2023-04-03 DIAGNOSIS — Z89431 Acquired absence of right foot: Secondary | ICD-10-CM

## 2023-04-03 DIAGNOSIS — K5732 Diverticulitis of large intestine without perforation or abscess without bleeding: Secondary | ICD-10-CM | POA: Diagnosis not present

## 2023-04-03 DIAGNOSIS — Z79631 Long term (current) use of antimetabolite agent: Secondary | ICD-10-CM

## 2023-04-03 DIAGNOSIS — Z886 Allergy status to analgesic agent status: Secondary | ICD-10-CM

## 2023-04-03 DIAGNOSIS — E785 Hyperlipidemia, unspecified: Secondary | ICD-10-CM | POA: Diagnosis present

## 2023-04-03 DIAGNOSIS — E1122 Type 2 diabetes mellitus with diabetic chronic kidney disease: Secondary | ICD-10-CM | POA: Diagnosis present

## 2023-04-03 DIAGNOSIS — Z885 Allergy status to narcotic agent status: Secondary | ICD-10-CM

## 2023-04-03 DIAGNOSIS — D649 Anemia, unspecified: Secondary | ICD-10-CM | POA: Diagnosis not present

## 2023-04-03 DIAGNOSIS — X58XXXA Exposure to other specified factors, initial encounter: Secondary | ICD-10-CM | POA: Diagnosis present

## 2023-04-03 DIAGNOSIS — Z794 Long term (current) use of insulin: Secondary | ICD-10-CM

## 2023-04-03 DIAGNOSIS — I129 Hypertensive chronic kidney disease with stage 1 through stage 4 chronic kidney disease, or unspecified chronic kidney disease: Secondary | ICD-10-CM | POA: Diagnosis present

## 2023-04-03 DIAGNOSIS — K8050 Calculus of bile duct without cholangitis or cholecystitis without obstruction: Secondary | ICD-10-CM | POA: Diagnosis not present

## 2023-04-03 DIAGNOSIS — K219 Gastro-esophageal reflux disease without esophagitis: Secondary | ICD-10-CM | POA: Diagnosis present

## 2023-04-03 DIAGNOSIS — K838 Other specified diseases of biliary tract: Secondary | ICD-10-CM | POA: Diagnosis not present

## 2023-04-03 DIAGNOSIS — N1832 Chronic kidney disease, stage 3b: Secondary | ICD-10-CM | POA: Diagnosis present

## 2023-04-03 DIAGNOSIS — Z79899 Other long term (current) drug therapy: Secondary | ICD-10-CM

## 2023-04-03 DIAGNOSIS — I480 Paroxysmal atrial fibrillation: Secondary | ICD-10-CM | POA: Diagnosis present

## 2023-04-03 DIAGNOSIS — K8051 Calculus of bile duct without cholangitis or cholecystitis with obstruction: Principal | ICD-10-CM | POA: Diagnosis present

## 2023-04-03 DIAGNOSIS — M069 Rheumatoid arthritis, unspecified: Secondary | ICD-10-CM | POA: Diagnosis present

## 2023-04-03 DIAGNOSIS — I10 Essential (primary) hypertension: Secondary | ICD-10-CM | POA: Diagnosis not present

## 2023-04-03 DIAGNOSIS — Z888 Allergy status to other drugs, medicaments and biological substances status: Secondary | ICD-10-CM

## 2023-04-03 DIAGNOSIS — S32019A Unspecified fracture of first lumbar vertebra, initial encounter for closed fracture: Secondary | ICD-10-CM | POA: Diagnosis present

## 2023-04-03 DIAGNOSIS — E1165 Type 2 diabetes mellitus with hyperglycemia: Secondary | ICD-10-CM | POA: Diagnosis present

## 2023-04-03 DIAGNOSIS — Z833 Family history of diabetes mellitus: Secondary | ICD-10-CM

## 2023-04-03 DIAGNOSIS — Z8249 Family history of ischemic heart disease and other diseases of the circulatory system: Secondary | ICD-10-CM

## 2023-04-03 DIAGNOSIS — Z980 Intestinal bypass and anastomosis status: Secondary | ICD-10-CM | POA: Diagnosis not present

## 2023-04-03 DIAGNOSIS — E875 Hyperkalemia: Secondary | ICD-10-CM | POA: Diagnosis not present

## 2023-04-03 DIAGNOSIS — Z823 Family history of stroke: Secondary | ICD-10-CM

## 2023-04-03 DIAGNOSIS — Z66 Do not resuscitate: Secondary | ICD-10-CM | POA: Diagnosis present

## 2023-04-03 DIAGNOSIS — Z882 Allergy status to sulfonamides status: Secondary | ICD-10-CM | POA: Diagnosis not present

## 2023-04-03 DIAGNOSIS — N1831 Chronic kidney disease, stage 3a: Secondary | ICD-10-CM | POA: Diagnosis not present

## 2023-04-03 DIAGNOSIS — I340 Nonrheumatic mitral (valve) insufficiency: Secondary | ICD-10-CM | POA: Diagnosis present

## 2023-04-03 DIAGNOSIS — L409 Psoriasis, unspecified: Secondary | ICD-10-CM | POA: Diagnosis present

## 2023-04-03 LAB — CBC WITH DIFFERENTIAL/PLATELET
Abs Immature Granulocytes: 0.18 10*3/uL — ABNORMAL HIGH (ref 0.00–0.07)
Basophils Absolute: 0 10*3/uL (ref 0.0–0.1)
Basophils Relative: 0 %
Eosinophils Absolute: 0 10*3/uL (ref 0.0–0.5)
Eosinophils Relative: 0 %
HCT: 36.1 % (ref 36.0–46.0)
Hemoglobin: 11.3 g/dL — ABNORMAL LOW (ref 12.0–15.0)
Immature Granulocytes: 2 %
Lymphocytes Relative: 11 %
Lymphs Abs: 1.2 10*3/uL (ref 0.7–4.0)
MCH: 30.5 pg (ref 26.0–34.0)
MCHC: 31.3 g/dL (ref 30.0–36.0)
MCV: 97.6 fL (ref 80.0–100.0)
Monocytes Absolute: 0.5 10*3/uL (ref 0.1–1.0)
Monocytes Relative: 4 %
Neutro Abs: 9.5 10*3/uL — ABNORMAL HIGH (ref 1.7–7.7)
Neutrophils Relative %: 83 %
Platelets: 227 10*3/uL (ref 150–400)
RBC: 3.7 MIL/uL — ABNORMAL LOW (ref 3.87–5.11)
RDW: 17.6 % — ABNORMAL HIGH (ref 11.5–15.5)
WBC: 11.4 10*3/uL — ABNORMAL HIGH (ref 4.0–10.5)
nRBC: 0 % (ref 0.0–0.2)

## 2023-04-03 LAB — COMPREHENSIVE METABOLIC PANEL
ALT: 59 U/L — ABNORMAL HIGH (ref 0–44)
AST: 26 U/L (ref 15–41)
Albumin: 3.3 g/dL — ABNORMAL LOW (ref 3.5–5.0)
Alkaline Phosphatase: 77 U/L (ref 38–126)
Anion gap: 9 (ref 5–15)
BUN: 63 mg/dL — ABNORMAL HIGH (ref 8–23)
CO2: 26 mmol/L (ref 22–32)
Calcium: 8.6 mg/dL — ABNORMAL LOW (ref 8.9–10.3)
Chloride: 102 mmol/L (ref 98–111)
Creatinine, Ser: 1.51 mg/dL — ABNORMAL HIGH (ref 0.44–1.00)
GFR, Estimated: 33 mL/min — ABNORMAL LOW (ref >60)
Glucose, Bld: 310 mg/dL — ABNORMAL HIGH (ref 70–99)
Potassium: 5.7 mmol/L — ABNORMAL HIGH (ref 3.5–5.1)
Sodium: 137 mmol/L (ref 135–145)
Total Bilirubin: 0.6 mg/dL (ref 0.0–1.2)
Total Protein: 6.2 g/dL — ABNORMAL LOW (ref 6.5–8.1)

## 2023-04-03 MED ORDER — FENTANYL CITRATE PF 50 MCG/ML IJ SOSY
100.0000 ug | PREFILLED_SYRINGE | Freq: Once | INTRAMUSCULAR | Status: AC
  Administered 2023-04-03: 100 ug via INTRAVENOUS
  Filled 2023-04-03: qty 2

## 2023-04-03 MED ORDER — ONDANSETRON HCL 4 MG/2ML IJ SOLN
4.0000 mg | Freq: Four times a day (QID) | INTRAMUSCULAR | Status: DC | PRN
  Filled 2023-04-03: qty 2

## 2023-04-03 MED ORDER — FENTANYL CITRATE PF 50 MCG/ML IJ SOSY
50.0000 ug | PREFILLED_SYRINGE | INTRAMUSCULAR | Status: DC | PRN
  Administered 2023-04-04: 50 ug via INTRAVENOUS
  Filled 2023-04-03: qty 1

## 2023-04-03 MED ORDER — MORPHINE SULFATE (PF) 2 MG/ML IV SOLN: 2.0000 mg | Freq: Once | INTRAVENOUS | Status: DC

## 2023-04-03 MED ORDER — LACTATED RINGERS IV BOLUS
1000.0000 mL | Freq: Once | INTRAVENOUS | Status: AC
  Administered 2023-04-03: 1000 mL via INTRAVENOUS

## 2023-04-03 MED ORDER — INSULIN ASPART 100 UNIT/ML IJ SOLN
4.0000 [IU] | Freq: Once | INTRAMUSCULAR | Status: AC
  Administered 2023-04-03: 4 [IU] via SUBCUTANEOUS
  Filled 2023-04-03: qty 0.04

## 2023-04-03 MED ORDER — IOPAMIDOL (ISOVUE-300) INJECTION 61%
75.0000 mL | Freq: Once | INTRAVENOUS | Status: AC | PRN
  Administered 2023-04-03: 75 mL via INTRAVENOUS

## 2023-04-03 NOTE — ED Triage Notes (Signed)
Patient to ED by POV with c/o ABD pain. Per patient she had a CT done and provider informed her there were stones and for her to report to ED. She denies N/V, but states pain goes from ABD to back.

## 2023-04-03 NOTE — ED Provider Notes (Signed)
[place] [facility] DEPARTMENT AT [facility] Provider Note   CSN: [id number] Arrival date & time: 04/03/23  1738     History  Chief Complaint  Patient presents with   Abdominal Pain    KEVYN WENGERT is a 89 y.o. female.   Abdominal Pain Patient presents for abdominal pain.  Medical history includes CKD, rheumatoid arthritis, DM, GERD, CAD, HLD, PAD, HTN.  10 days ago, she developed pain in her back.  Pain subsequently radiated to her abdomen.  Initially, it was right-sided.  It is recently migrated to left side.  Pain has been persistent and refractory to over-the-counter medications and muscle relaxers.  She was undergoing outpatient workup and got CT scan earlier this morning.  Results showed choledocholithiasis.  Patient denies any recent fevers, chills, vomiting.  She has ongoing pain which currently is located in left upper quadrant.     Home Medications Prior to Admission medications   Medication Sig Start Date End Date Taking? Authorizing Provider  acetaminophen (TYLENOL) 500 MG tablet Take 1 tablet (500 mg total) by mouth every 8 (eight) hours as needed. 08/12/22   Leroy Sea, MD  amiodarone (PACERONE) 200 MG tablet Take 0.5 tablets (100 mg total) by mouth daily. 09/10/22   Duke Salvia, MD  atorvastatin (LIPITOR) 20 MG tablet Take 1 tablet by mouth once daily 02/25/23   Duke Salvia, MD  bismuth subsalicylate (PEPTO BISMOL) 262 MG/15ML suspension Take 30 mLs by mouth daily at 6 (six) AM.    [provider]  Calcium Carbonate-Vitamin D 600-400 MG-UNIT tablet Take 1 tablet by mouth daily at 12 noon.     [provider]  diclofenac Sodium (VOLTAREN) 1 % GEL Apply 4 g topically 4 (four) times daily. 02/15/22   Setzer, Lynnell Jude, PA-C  folic acid (FOLVITE) 1 MG tablet Take 1 mg by mouth once a week.    [provider]  insulin aspart (NOVOLOG) 100 UNIT/ML FlexPen Inject 2 Units into the skin 3 (three) times daily with meals. Sliding  scale 02/15/22   Setzer, Lynnell Jude, PA-C  Insulin Degludec (TRESIBA) 100 UNIT/ML SOLN Inject 4 Units into the skin at bedtime. 02/15/22   Setzer, Lynnell Jude, PA-C  methocarbamol (ROBAXIN) 500 MG tablet Take 500 mg by mouth as needed.    [provider]  methotrexate 50 MG/2ML injection Inject 0.4 mLs into the muscle once a week. 07/26/22   [provider]  mirtazapine (REMERON) 7.5 MG tablet Take 7.5 mg by mouth at bedtime.    [provider]  Multiple Vitamins-Minerals (CENTRUM SILVER 50+WOMEN PO) Take 1 tablet by mouth daily.    [provider]  nitroGLYCERIN (NITROSTAT) 0.4 MG SL tablet Dissolve 1 tablet under the tongue every 5 minutes as needed for chest pain. Max of 3 doses, then 911. 07/23/22   Nahser, Deloris Ping, MD  predniSONE (DELTASONE) 5 MG tablet Take 1 tablet (5 mg total) by mouth daily with breakfast. 02/15/22   Setzer, Lynnell Jude, PA-C  pregabalin (LYRICA) 50 MG capsule Take 50 mg by mouth in the morning.    [provider]  pregabalin (LYRICA) 75 MG capsule Take 75 mg by mouth at bedtime.    [provider]  protein supplement shake (PREMIER PROTEIN) LIQD Take 2 oz by mouth 2 (two) times daily.    [provider]  pyrithione zinc (SELSUN BLUE DRY SCALP) 1 % shampoo Apply 1 application topically once a week. 04/15/15   [provider]  RESTASIS 0.05 % ophthalmic emulsion Place 1 drop into both eyes 2 (two) times daily. 02/13/19   [provider]      Allergies    Cholestyramine, Compazine [prochlorperazine edisylate], Celecoxib, Sulfonamide derivatives, Hydrocodone, Infliximab, Leflunomide, Prochlorperazine, Rosuvastatin, and Sulfa antibiotics    Review of Systems   Review of Systems  Gastrointestinal:  Positive for abdominal pain.  Musculoskeletal:  Positive for back pain.  All other systems reviewed and are negative.   Physical Exam Updated Vital Signs BP (!) 174/60   Pulse 70   Temp 98.2 F (36.8  C) (Oral)   Resp 18   Ht 5\' 4"  (1.626 m)   Wt 79.8 kg   SpO2 98%   BMI 30.21 kg/m  Physical Exam Vitals and nursing note reviewed.  Constitutional:      General: She is not in acute distress.    Appearance: She is well-developed. She is not ill-appearing, toxic-appearing or diaphoretic.  HENT:     Head: Normocephalic and atraumatic.     Mouth/Throat:     Mouth: Mucous membranes are moist.  Eyes:     General: No scleral icterus.    Conjunctiva/sclera: Conjunctivae normal.  Cardiovascular:     Rate and Rhythm: Normal rate and regular rhythm.  Pulmonary:     Effort: Pulmonary effort is normal. No respiratory distress.  Abdominal:     Palpations: Abdomen is soft.     Tenderness: There is generalized abdominal tenderness. There is no guarding or rebound.  Musculoskeletal:        General: No swelling.     Cervical back: Neck supple.  Skin:    General: Skin is warm and dry.     Coloration: Skin is not cyanotic, jaundiced or pale.  Neurological:     General: No focal deficit present.     Mental Status: She is alert and oriented to person, place, and time.  Psychiatric:        Mood and Affect: Mood normal.        Behavior: Behavior normal.     ED Results / Procedures / Treatments   Labs (all labs ordered are listed, but only abnormal results are displayed) Labs Reviewed  CBC WITH DIFFERENTIAL/PLATELET - Abnormal; Notable for the following components:      Result Value   WBC 11.4 (*)    RBC 3.70 (*)    Hemoglobin 11.3 (*)    RDW 17.6 (*)    Neutro Abs 9.5 (*)    Abs Immature Granulocytes 0.18 (*)    All other components within normal limits  COMPREHENSIVE METABOLIC PANEL - Abnormal; Notable for the following components:   Potassium 5.7 (*)    Glucose, Bld 310 (*)    BUN 63 (*)    Creatinine, Ser 1.51 (*)    Calcium 8.6 (*)    Total Protein 6.2 (*)    Albumin 3.3 (*)    ALT 59 (*)    GFR, Estimated 33 (*)    All other components within normal limits     EKG None  Radiology CT ABDOMEN PELVIS W CONTRAST Addendum Date: 04/03/2023 ADDENDUM REPORT: 04/03/2023 17:11 ADDENDUM: These results were called by telephone at the time of interpretation on 04/03/2023 at 5:11 pm to provider Dour Clelia Croft, who verbally acknowledged these results. Electronically Signed   By: Signa Kell M.D.   On: 04/03/2023 17:11   Result Date: 04/03/2023 CLINICAL DATA:  History of pancreatitis. Status post cholecystectomy. Complains of severe lower abdominal pain  and distension. EXAM: CT ABDOMEN AND PELVIS WITH CONTRAST TECHNIQUE: Multidetector CT imaging of the abdomen and pelvis was performed using the standard protocol following bolus administration of intravenous contrast. RADIATION DOSE REDUCTION: This exam was performed according to the departmental dose-optimization program which includes automated exposure control, adjustment of the mA and/or kV according to patient size and/or use of iterative reconstruction technique. CONTRAST:  75mL ISOVUE-300 IOPAMIDOL (ISOVUE-300) INJECTION 61% COMPARISON:  MRI/MRCP from 08/10/2022.  CT AP from 08/07/2022. FINDINGS: Lower chest: Scarring noted within the lung bases. No pleural or pericardial effusion. Hepatobiliary: No focal liver abnormality. Status post cholecystectomy. The common bile duct measures 8 mm in diameter which is unchanged compared with the previous exam. No significant intrahepatic bile duct dilatation. Within the distal common bile duct there are 2 gallstones identified which measure up to 5 mm, image 39/4. Pancreas: Unremarkable. No pancreatic ductal dilatation or surrounding inflammatory changes. Spleen: Normal in size without focal abnormality. Adrenals/Urinary Tract: Normal adrenal glands. Bilateral Bosniak class 1 cysts are identified. The largest arises off the lateral cortex of the right kidney measuring 2.8 cm, image 22/2. No follow-up imaging recommended. Arising off the upper pole of the right kidney is a exophytic  cyst which measures 41 Hounsfield units and 1.3 cm. On the previous MRI this was uniformly T1 hyperintense and T2 hypointense compatible with a benign Bosniak class 2 cyst. No follow-up imaging recommended. No nephrolithiasis or obstructive uropathy. Urinary bladder appears normal. Stomach/Bowel: Stomach is normal. No pathologic dilatation of the large or small bowel loops to suggest a bowel obstruction. Fecalized distal small bowel loops are noted. No small bowel wall thickening, inflammation or distension. Mild to moderate stool burden noted throughout the colon. Severe sigmoid diverticulosis. No signs of acute diverticulitis. The appendix is visualized and appears normal. Vascular/Lymphatic: Aortic atherosclerosis without aneurysm. The upper abdominal vascularity appears patent. Reproductive: Uterus and bilateral adnexa are unremarkable. Other: No free fluid or fluid collections. No signs of pneumoperitoneum. Musculoskeletal: Multilevel lumbar spondylosis identified. Anterolisthesis of L4 on L5 measures 5 mm. Mild superior endplate deformity involving the L1 vertebra is identified, new compared with the exam from 08/07/2022. There is mild retropulsion of the fracture fragments by approximately 3 mm. IMPRESSION: 1. Signs of choledocholithiasis status post cholecystectomy. There are 2 stones identified within the distal common bile duct which measure up to 5 mm. 2. Severe sigmoid diverticulosis without signs of acute diverticulitis. 3. Mild superior endplate deformity involving the L1 vertebra is identified, new compared with the exam from 08/07/2022. There is mild retropulsion of the fracture fragments by approximately 3 mm. 4.  Aortic Atherosclerosis (ICD10-I70.0). Electronically Signed: By: Signa Kell M.D. On: 04/03/2023 17:07    Procedures Procedures    Medications Ordered in ED Medications  lactated ringers bolus 1,000 mL (has no administration in time range)  fentaNYL (SUBLIMAZE) injection 100  mcg (has no administration in time range)  ondansetron (ZOFRAN) injection 4 mg (has no administration in time range)  fentaNYL (SUBLIMAZE) injection 50 mcg (has no administration in time range)  insulin aspart (novoLOG) injection 4 Units (has no administration in time range)    ED Course/ Medical Decision Making/ A&P                                 Medical Decision Making Amount and/or Complexity of Data Reviewed Labs: ordered.   This patient presents to the ED for concern of abdominal pain, this involves  an extensive number of treatment options, and is a complaint that carries with it a high risk of complications and morbidity.  The differential diagnosis includes choledocholithiasis, gastritis, SBO, mesenteric ischemia, enteritis, colitis   Co morbidities that complicate the patient evaluation  CKD, rheumatoid arthritis, DM, GERD, CAD, HLD, PAD, HTN   Additional history obtained:  Additional history obtained from patient's family External records from outside source obtained and reviewed including EMR   Lab Tests:  I Ordered, and personally interpreted labs.  The pertinent results include: Creatinine, hypokalemia with otherwise normal electrolytes, mild leukocytosis, anemia improving baseline   Imaging Studies ordered:  I reviewed CT imaging from earlier today which showed findings consistent with choledocholithiasis.  2 stones identified within the distal CBD.   Cardiac Monitoring: / EKG:  The patient was maintained on a cardiac monitor.  I personally viewed and interpreted the cardiac monitored which showed an underlying rhythm of: Sinus rhythm   Consultations Obtained:  [facility] consulted with gastroenterologist, Dr. Leonides Schanz, who recommends admission to hospitalist and keeping n.p.o. for now.   Problem List / ED Course / Critical interventions / Medication management  Patient presenting for abdominal pain, worsening over the past 10 days.  She underwent  outpatient CT scan earlier today which showed choledocholithiasis.  Prior to being bedded in the ED, GI was consulted.  Dr. Leonides Schanz recommends keeping n.p.o. for now and admission to hospitalist.  On assessment, patient is overall well-appearing.  She endorses ongoing abdominal pain.  Mild tenderness is present.  Lab work is notable for hyperglycemia without evidence of DKA, mild hyperkalemia.  Fentanyl was ordered for analgesia.  IV fluids were ordered for hydration.  Insulin was ordered for hyperglycemia and hyperkalemia.  Although CT can from earlier today did show an age-indeterminate L1 fracture, patient has not had pain in this area.  Pain has been more in the area of L5.  There is no point tenderness at L1.  I do not suspect acute L1 fracture.  Patient was kept NPO.  She was admitted for further management. I ordered medication including fentanyl for analgesia; IV fluids for hydration; insulin for hyperglycemia Reevaluation of the patient after these medicines showed that the patient improved I have reviewed the patients home medicines and have made adjustments as needed   Social Determinants of Health:  Has access to outpatient care        Final Clinical Impression(s) / ED Diagnoses Final diagnoses:  Choledocholithiasis    Rx / DC Orders ED Discharge Orders     None         Gloris Manchester, MD 04/03/23 2143

## 2023-04-03 NOTE — ED Provider Triage Note (Signed)
Emergency Medicine Provider Triage Evaluation Note  CHRISTYANNA MCKEON , a 89 y.o. female  was evaluated in triage.  Pt complains of RUQ pain.  Patient endorses right upper quadrant pain for the past week.  Had a CT with her primary care provider today which showed 5 mm obstructing stone in common bile duct.  Review of Systems  Positive: Right upper quadrant pain Negative: N/V/D/fever  Physical Exam  BP 122/81   Pulse 70   Temp 98 F (36.7 C)   Resp 20   Ht 5\' 4"  (1.626 m)   Wt 79.8 kg   SpO2 98%   BMI 30.21 kg/m  Gen:   Awake, no distress   Resp:  Normal effort  MSK:   Moves extremities without difficulty  Other:  RUQ tenderness  Medical Decision Making  Medically screening exam initiated at 6:37 PM.  Appropriate orders placed.  Algernon Huxley Moffatt was informed that the remainder of the evaluation will be completed by another provider, this initial triage assessment does not replace that evaluation, and the importance of remaining in the ED until their evaluation is complete.   I consulted with [facility].  Spoke with Dr. Leonides Schanz who recommend admission to the hospital and they will see her in the morning.     Maxwell Marion, PA-C 04/03/23 2110

## 2023-04-04 ENCOUNTER — Encounter (HOSPITAL_COMMUNITY): Payer: Self-pay | Admitting: Internal Medicine

## 2023-04-04 DIAGNOSIS — D649 Anemia, unspecified: Secondary | ICD-10-CM

## 2023-04-04 DIAGNOSIS — R7989 Other specified abnormal findings of blood chemistry: Secondary | ICD-10-CM

## 2023-04-04 DIAGNOSIS — E1122 Type 2 diabetes mellitus with diabetic chronic kidney disease: Secondary | ICD-10-CM

## 2023-04-04 DIAGNOSIS — N1831 Chronic kidney disease, stage 3a: Secondary | ICD-10-CM | POA: Diagnosis not present

## 2023-04-04 DIAGNOSIS — K8050 Calculus of bile duct without cholangitis or cholecystitis without obstruction: Secondary | ICD-10-CM | POA: Diagnosis not present

## 2023-04-04 DIAGNOSIS — Z794 Long term (current) use of insulin: Secondary | ICD-10-CM

## 2023-04-04 LAB — CBC WITH DIFFERENTIAL/PLATELET
Abs Immature Granulocytes: 0.16 10*3/uL — ABNORMAL HIGH (ref 0.00–0.07)
Basophils Absolute: 0 10*3/uL (ref 0.0–0.1)
Basophils Relative: 0 %
Eosinophils Absolute: 0.1 10*3/uL (ref 0.0–0.5)
Eosinophils Relative: 1 %
HCT: 37.8 % (ref 36.0–46.0)
Hemoglobin: 11.7 g/dL — ABNORMAL LOW (ref 12.0–15.0)
Immature Granulocytes: 2 %
Lymphocytes Relative: 18 %
Lymphs Abs: 1.7 10*3/uL (ref 0.7–4.0)
MCH: 30.4 pg (ref 26.0–34.0)
MCHC: 31 g/dL (ref 30.0–36.0)
MCV: 98.2 fL (ref 80.0–100.0)
Monocytes Absolute: 1 10*3/uL (ref 0.1–1.0)
Monocytes Relative: 10 %
Neutro Abs: 6.8 10*3/uL (ref 1.7–7.7)
Neutrophils Relative %: 69 %
Platelets: 209 10*3/uL (ref 150–400)
RBC: 3.85 MIL/uL — ABNORMAL LOW (ref 3.87–5.11)
RDW: 17.8 % — ABNORMAL HIGH (ref 11.5–15.5)
WBC: 9.9 10*3/uL (ref 4.0–10.5)
nRBC: 0 % (ref 0.0–0.2)

## 2023-04-04 LAB — BASIC METABOLIC PANEL
Anion gap: 9 (ref 5–15)
BUN: 55 mg/dL — ABNORMAL HIGH (ref 8–23)
CO2: 26 mmol/L (ref 22–32)
Calcium: 8.7 mg/dL — ABNORMAL LOW (ref 8.9–10.3)
Chloride: 107 mmol/L (ref 98–111)
Creatinine, Ser: 1.34 mg/dL — ABNORMAL HIGH (ref 0.44–1.00)
GFR, Estimated: 38 mL/min — ABNORMAL LOW (ref >60)
Glucose, Bld: 78 mg/dL (ref 70–99)
Potassium: 4.5 mmol/L (ref 3.5–5.1)
Sodium: 142 mmol/L (ref 135–145)

## 2023-04-04 LAB — HEPATIC FUNCTION PANEL
ALT: 53 U/L — ABNORMAL HIGH (ref 0–44)
AST: 23 U/L (ref 15–41)
Albumin: 3.4 g/dL — ABNORMAL LOW (ref 3.5–5.0)
Alkaline Phosphatase: 72 U/L (ref 38–126)
Bilirubin, Direct: 0.1 mg/dL (ref 0.0–0.2)
Indirect Bilirubin: 0.8 mg/dL (ref 0.3–0.9)
Total Bilirubin: 0.9 mg/dL (ref 0.0–1.2)
Total Protein: 6.2 g/dL — ABNORMAL LOW (ref 6.5–8.1)

## 2023-04-04 LAB — GLUCOSE, CAPILLARY
Glucose-Capillary: 243 mg/dL — ABNORMAL HIGH (ref 70–99)
Glucose-Capillary: 240 mg/dL — ABNORMAL HIGH (ref 70–99)

## 2023-04-04 LAB — HEMOGLOBIN A1C
Hgb A1c MFr Bld: 9.2 % — ABNORMAL HIGH (ref 4.8–5.6)
Mean Plasma Glucose: 217.34 mg/dL

## 2023-04-04 LAB — LIPASE, BLOOD: Lipase: 35 U/L (ref 11–51)

## 2023-04-04 LAB — CBG MONITORING, ED
Glucose-Capillary: 173 mg/dL — ABNORMAL HIGH (ref 70–99)
Glucose-Capillary: 188 mg/dL — ABNORMAL HIGH (ref 70–99)

## 2023-04-04 MED ORDER — HYDROCORTISONE SOD SUC (PF) 100 MG IJ SOLR
50.0000 mg | Freq: Three times a day (TID) | INTRAMUSCULAR | Status: DC
  Administered 2023-04-04 – 2023-04-07 (×11): 50 mg via INTRAVENOUS
  Filled 2023-04-04 (×11): qty 2

## 2023-04-04 MED ORDER — FENTANYL CITRATE PF 50 MCG/ML IJ SOSY
25.0000 ug | PREFILLED_SYRINGE | INTRAMUSCULAR | Status: AC | PRN
  Administered 2023-04-04 – 2023-04-05 (×6): 25 ug via INTRAVENOUS
  Filled 2023-04-04 (×7): qty 1

## 2023-04-04 MED ORDER — CIPROFLOXACIN IN D5W 400 MG/200ML IV SOLN
400.0000 mg | Freq: Once | INTRAVENOUS | Status: AC
  Administered 2023-04-04: 400 mg via INTRAVENOUS
  Filled 2023-04-04: qty 200

## 2023-04-04 MED ORDER — SODIUM CHLORIDE 0.9 % IV SOLN: INTRAVENOUS | Status: DC

## 2023-04-04 MED ORDER — AMIODARONE HCL 200 MG PO TABS
100.0000 mg | ORAL_TABLET | Freq: Every day | ORAL | Status: DC
  Administered 2023-04-04 – 2023-04-07 (×4): 100 mg via ORAL
  Filled 2023-04-04 (×4): qty 1

## 2023-04-04 MED ORDER — LACTATED RINGERS IV SOLN: INTRAVENOUS | Status: AC

## 2023-04-04 MED ORDER — INDOMETHACIN 50 MG RE SUPP
100.0000 mg | Freq: Once | RECTAL | Status: DC
  Filled 2023-04-04: qty 2

## 2023-04-04 MED ORDER — INSULIN ASPART 100 UNIT/ML IJ SOLN
0.0000 [IU] | Freq: Three times a day (TID) | INTRAMUSCULAR | Status: DC
  Administered 2023-04-04: 3 [IU] via SUBCUTANEOUS
  Administered 2023-04-05: 7 [IU] via SUBCUTANEOUS
  Administered 2023-04-05: 5 [IU] via SUBCUTANEOUS
  Administered 2023-04-06: 7 [IU] via SUBCUTANEOUS
  Administered 2023-04-06: 3 [IU] via SUBCUTANEOUS
  Administered 2023-04-06: 5 [IU] via SUBCUTANEOUS
  Administered 2023-04-07: 7 [IU] via SUBCUTANEOUS
  Administered 2023-04-07: 3 [IU] via SUBCUTANEOUS
  Filled 2023-04-04: qty 0.09

## 2023-04-04 NOTE — Progress Notes (Signed)
PROGRESS NOTE  Shelby Walker ZOX:[id number] DOB: 17-Jun-1933 DOA: 04/03/2023 PCP: Adrian Prince, MD   LOS: 1 day   Brief Narrative / Interim history: 89 year old female with DM2, PAF, HLD, CKD 3, RA who was admitted in June 2024 for acute biliary pancreatitis, status postcholecystectomy, comes into the hospital for abdominal and back pain for the past week.  Patient is not the best historian but tells me that she has been having lower back pain, sometimes wrapping around the sides on the left as well as the right side, sometimes the pain is in the right upper quadrant also.  No nausea or vomiting, no fever or chills.  She was treated conservatively as an outpatient but due to lack of improvement underwent a CT scan which showed cholelithiasis, and she was directed to the ER  Subjective / 24h Interval events: She is doing well this morning, denies any pain for me.  No nausea or vomiting.  No fever or chills.  Assesement and Plan: Principal Problem:   Choledocholithiasis Active Problems:   Diabetes mellitus (HCC)   PAD (peripheral artery disease) (HCC)   Paroxysmal atrial fibrillation (HCC)   Rheumatoid arthritis (HCC)   Stage 3b chronic kidney disease (CKD) (HCC)   DNR (do not resuscitate)   Elevated LFTs   Anemia  Principal problem Choledocholithiasis-white count is normal, LFTs are normal, there is no signs of cholangitis.  Gastroenterology consulted, they will evaluate patient, appreciate input  Active problems Mild superior endplate deformity of the L1 vertebra -this appears to be new, could potentially explain some of her back pain.  Conservative management, PT consult  PAF-continue amiodarone, apparently not on anticoagulation due to fall risk  CKD 3B-baseline creatinine 1.3-1.7, currently at baseline  Anemia of chronic disease-hemoglobin stable, no bleeding  Chronic wound of the left lower extremity-outpatient follow-up  DM 2-continue sliding scale  Scheduled Meds:   amiodarone  100 mg Oral Daily   hydrocortisone sod succinate (SOLU-CORTEF) inj  50 mg Intravenous Q8H   insulin aspart  0-9 Units Subcutaneous TID WC   Continuous Infusions:  lactated ringers     PRN Meds:.fentaNYL (SUBLIMAZE) injection, ondansetron (ZOFRAN) IV  Current Outpatient Medications  Medication Instructions   acetaminophen (TYLENOL) 650 mg, Oral, Every 6 hours PRN   acetaminophen-codeine (TYLENOL #3) 300-30 MG tablet 1 tablet, Oral, Every 4 hours PRN   amiodarone (PACERONE) 100 mg, Oral, Daily   atorvastatin (LIPITOR) 20 mg, Oral, Daily   Calcium Carbonate-Vitamin D 600-400 MG-UNIT tablet 1 tablet, Oral, Daily,     diclofenac Sodium (VOLTAREN) 4 g, Topical, 4 times daily   esomeprazole (NEXIUM) 20 mg, Oral, Daily   folic acid (FOLVITE) 1 mg, Oral, Daily   insulin aspart (NOVOLOG) 2 Units, Subcutaneous, 3 times daily with meals, Sliding scale   Insulin Degludec (TRESIBA) 4 Units, Subcutaneous, Daily at bedtime   iron polysaccharides (NIFEREX) 150 mg, Oral, Daily   methocarbamol (ROBAXIN) 500 mg, Oral, As needed   methotrexate 50 MG/2ML injection 0.4 mLs, Intramuscular, Weekly   mirtazapine (REMERON) 7.5 mg, Oral, Daily at bedtime   Multiple Vitamins-Minerals (CENTRUM SILVER 50+WOMEN PO) 1 tablet, Oral, Daily   nitroGLYCERIN (NITROSTAT) 0.4 MG SL tablet Dissolve 1 tablet under the tongue every 5 minutes as needed for chest pain. Max of 3 doses, then 911.   predniSONE (DELTASONE) 5 mg, Oral, Daily with breakfast   pregabalin (LYRICA) 75 mg, Oral, Nightly   pregabalin (LYRICA) 50 mg, Oral, Every morning   protein supplement shake (PREMIER PROTEIN) LIQD 2  oz, Oral, 2 times daily   pyrithione zinc (SELSUN BLUE DRY SCALP) 1 % shampoo 1 application , Topical, Weekly   RESTASIS 0.05 % ophthalmic emulsion 1 drop, Both Eyes, 2 times daily   traMADol (ULTRAM) 50 mg, Oral, Every 6 hours PRN    Diet Orders (From admission, onward)     Start     Ordered   04/05/23 0001  Diet NPO  time specified Except for: Sips with Meds  Diet effective midnight       Question:  Except for  Answer:  Sips with Meds   04/04/23 1610   04/04/23 0907  Diet clear liquid Room service appropriate? Yes; Fluid consistency: Thin  Diet effective now       Question Answer Comment  Room service appropriate? Yes   Fluid consistency: Thin      04/04/23 0906            DVT prophylaxis: SCDs Start: 04/04/23 0154   Lab Results  Component Value Date   PLT 209 04/04/2023      Code Status: Limited: Do not attempt resuscitation (DNR) -DNR-LIMITED -Do Not Intubate/DNI   Family Communication: no family at bedside   Status is: Inpatient Remains inpatient appropriate because: severity of illness  Level of care: Telemetry  Consultants:  GI  Objective: Vitals:   04/04/23 0408 04/04/23 0630 04/04/23 0700 04/04/23 0950  BP:  (!) 122/49 (!) 113/54 110/76  Pulse:  62  72  Resp:  14 14 18   Temp: 98.1 F (36.7 C) 98.2 F (36.8 C)  98.1 F (36.7 C)  TempSrc:    Oral  SpO2:  95%  95%  Weight:      Height:       No intake or output data in the 24 hours ending 04/04/23 1039 Wt Readings from Last 3 Encounters:  04/03/23 79.8 kg  03/11/23 55.7 kg  03/05/23 54.8 kg    Examination:  Constitutional: NAD Eyes: no scleral icterus ENMT: Mucous membranes are moist.  Neck: normal, supple Respiratory: clear to auscultation bilaterally, no wheezing, no crackles.  Cardiovascular: Regular rate and rhythm, no murmurs / rubs / gallops. No LE edema.  Abdomen: non distended, no tenderness. Bowel sounds positive.  Musculoskeletal: no clubbing / cyanosis.   Data Reviewed: I have independently reviewed following labs and imaging studies   CBC Recent Labs  Lab 04/03/23 1823 04/04/23 0414  WBC 11.4* 9.9  HGB 11.3* 11.7*  HCT 36.1 37.8  PLT 227 209  MCV 97.6 98.2  MCH 30.5 30.4  MCHC 31.3 31.0  RDW 17.6* 17.8*  LYMPHSABS 1.2 1.7  MONOABS 0.5 1.0  EOSABS 0.0 0.1  BASOSABS 0.0 0.0     Recent Labs  Lab 04/03/23 1823 04/04/23 0414  NA 137 142  K 5.7* 4.5  CL 102 107  CO2 26 26  GLUCOSE 310* 78  BUN 63* 55*  CREATININE 1.51* 1.34*  CALCIUM 8.6* 8.7*  AST 26 23  ALT 59* 53*  ALKPHOS 77 72  BILITOT 0.6 0.9  ALBUMIN 3.3* 3.4*    ------------------------------------------------------------------------------------------------------------------ No results for input(s): "CHOL", "HDL", "LDLCALC", "TRIG", "CHOLHDL", "LDLDIRECT" in the last 72 hours.  Lab Results  Component Value Date   HGBA1C 9.2 (H) 08/08/2022   ------------------------------------------------------------------------------------------------------------------ No results for input(s): "TSH", "T4TOTAL", "T3FREE", "THYROIDAB" in the last 72 hours.  Invalid input(s): "FREET3"  Cardiac Enzymes No results for input(s): "CKMB", "TROPONINI", "MYOGLOBIN" in the last 168 hours.  Invalid input(s): "CK" ------------------------------------------------------------------------------------------------------------------    Component Value  Date/Time   BNP 991.2 (H) 08/12/2022 0415    CBG: Recent Labs  Lab 04/04/23 1014  GLUCAP 173*    No results found for this or any previous visit (from the past 240 hours).   Radiology Studies: CT ABDOMEN PELVIS W CONTRAST Addendum Date: 04/03/2023 ADDENDUM REPORT: 04/03/2023 17:11 ADDENDUM: These results were called by telephone at the time of interpretation on 04/03/2023 at 5:11 pm to provider Dour Clelia Croft, who verbally acknowledged these results. Electronically Signed   By: Signa Kell M.D.   On: 04/03/2023 17:11   Result Date: 04/03/2023 CLINICAL DATA:  History of pancreatitis. Status post cholecystectomy. Complains of severe lower abdominal pain and distension. EXAM: CT ABDOMEN AND PELVIS WITH CONTRAST TECHNIQUE: Multidetector CT imaging of the abdomen and pelvis was performed using the standard protocol following bolus administration of intravenous contrast.  RADIATION DOSE REDUCTION: This exam was performed according to the departmental dose-optimization program which includes automated exposure control, adjustment of the mA and/or kV according to patient size and/or use of iterative reconstruction technique. CONTRAST:  75mL ISOVUE-300 IOPAMIDOL (ISOVUE-300) INJECTION 61% COMPARISON:  MRI/MRCP from 08/10/2022.  CT AP from 08/07/2022. FINDINGS: Lower chest: Scarring noted within the lung bases. No pleural or pericardial effusion. Hepatobiliary: No focal liver abnormality. Status post cholecystectomy. The common bile duct measures 8 mm in diameter which is unchanged compared with the previous exam. No significant intrahepatic bile duct dilatation. Within the distal common bile duct there are 2 gallstones identified which measure up to 5 mm, image 39/4. Pancreas: Unremarkable. No pancreatic ductal dilatation or surrounding inflammatory changes. Spleen: Normal in size without focal abnormality. Adrenals/Urinary Tract: Normal adrenal glands. Bilateral Bosniak class 1 cysts are identified. The largest arises off the lateral cortex of the right kidney measuring 2.8 cm, image 22/2. No follow-up imaging recommended. Arising off the upper pole of the right kidney is a exophytic cyst which measures 41 Hounsfield units and 1.3 cm. On the previous MRI this was uniformly T1 hyperintense and T2 hypointense compatible with a benign Bosniak class 2 cyst. No follow-up imaging recommended. No nephrolithiasis or obstructive uropathy. Urinary bladder appears normal. Stomach/Bowel: Stomach is normal. No pathologic dilatation of the large or small bowel loops to suggest a bowel obstruction. Fecalized distal small bowel loops are noted. No small bowel wall thickening, inflammation or distension. Mild to moderate stool burden noted throughout the colon. Severe sigmoid diverticulosis. No signs of acute diverticulitis. The appendix is visualized and appears normal. Vascular/Lymphatic: Aortic  atherosclerosis without aneurysm. The upper abdominal vascularity appears patent. Reproductive: Uterus and bilateral adnexa are unremarkable. Other: No free fluid or fluid collections. No signs of pneumoperitoneum. Musculoskeletal: Multilevel lumbar spondylosis identified. Anterolisthesis of L4 on L5 measures 5 mm. Mild superior endplate deformity involving the L1 vertebra is identified, new compared with the exam from 08/07/2022. There is mild retropulsion of the fracture fragments by approximately 3 mm. IMPRESSION: 1. Signs of choledocholithiasis status post cholecystectomy. There are 2 stones identified within the distal common bile duct which measure up to 5 mm. 2. Severe sigmoid diverticulosis without signs of acute diverticulitis. 3. Mild superior endplate deformity involving the L1 vertebra is identified, new compared with the exam from 08/07/2022. There is mild retropulsion of the fracture fragments by approximately 3 mm. 4.  Aortic Atherosclerosis (ICD10-I70.0). Electronically Signed: By: Signa Kell M.D. On: 04/03/2023 17:07     Pamella Pert, MD, PhD [facility]  Between 7 am - 7 pm I am available, please contact me via Amion (for emergencies)  or Securechat (non urgent messages)  Between 7 pm - 7 am I am not available, please contact night coverage MD/APP via Amion

## 2023-04-04 NOTE — Inpatient Diabetes Management (Signed)
Inpatient Diabetes Program Recommendations  AACE/ADA: New Consensus Statement on Inpatient Glycemic Control (2015)  Target Ranges:  Prepandial:   less than 140 mg/dL      Peak postprandial:   less than 180 mg/dL (1-2 hours)      Critically ill patients:  140 - 180 mg/dL   Lab Results  Component Value Date   GLUCAP 113 (H) 08/12/2022   HGBA1C 9.2 (H) 08/08/2022    Review of Glycemic Control  Latest Reference Range & Units 04/03/23 18:23 04/03/23 22:15 04/04/23 04:14  Glucose 70 - 99 mg/dL 191 (H) Novolog 4 units 78  (H): Data is abnormally high  Diabetes history: DM2 Outpatient Diabetes medications:  Novolog 2 units TID Tresiba 6 units QD Current orders for Inpatient glycemic control: Solu-Cortef 50 mg Q8H  Inpatient Diabetes Program Recommendations:    Please consider:  Glycemic control order set using Novolog 0-6 units TID.  Will continue to follow while inpatient.  Thank you, Dulce Sellar, MSN, CDCES Diabetes Coordinator Inpatient Diabetes Program [phone] (team pager from 8a-5p)

## 2023-04-04 NOTE — ED Notes (Signed)
Allyne Gee, PA-C examined pt arm. Swelling and redness noted.

## 2023-04-04 NOTE — ED Notes (Signed)
ED TO INPATIENT HANDOFF REPORT  Name/Age/Gender Shelby Walker 89 y.o. female  Code Status    Code Status Orders  (From admission, onward)           Start     Ordered   04/04/23 0157  Do not attempt resuscitation (DNR)- Limited -Do Not Intubate (DNI)  Continuous       Question Answer Comment  If pulseless and not breathing No CPR or chest compressions.   In Pre-Arrest Conditions (Patient Is Breathing and Has A Pulse) Do not intubate. Provide all appropriate non-invasive medical interventions. Avoid ICU transfer unless indicated or required.   Consent: Discussion documented in EHR or advanced directives reviewed      04/04/23 0156           Code Status History     Date Active Date Inactive Code Status Order ID Comments User Context   08/07/2022 2213 08/12/2022 1657 DNR [id number]  Briscoe Deutscher, MD ED   02/02/2022 1846 02/15/2022 1707 DNR [id number]  Milinda Antis, PA-C Inpatient   02/02/2022 1846 02/02/2022 1846 DNR [id number]  Milinda Antis, PA-C Inpatient   01/26/2022 1309 02/02/2022 1827 DNR [id number]  Zannie Cove, MD Inpatient   01/25/2022 1702 01/26/2022 1309 Partial Code [id number]  Kalman Shan, MD Inpatient   01/24/2022 1132 01/25/2022 1702 Full Code [id number]  Minor, Vilinda Blanks, NP ED   01/04/2022 2159 01/06/2022 2227 Full Code [id number]  Joellen Jersey., MD ED   01/24/2021 1702 02/06/2021 1747 DNR [id number]  Jacquelynn Cree, PA-C Inpatient   01/24/2021 1702 01/24/2021 1702 DNR [id number]  Jacquelynn Cree, PA-C Inpatient   01/16/2021 1607 01/24/2021 1618 DNR [id number]  Jonah Blue, MD Inpatient   10/19/2020 1131 10/19/2020 2254 Full Code [id number]  Iran Ouch, MD Inpatient   08/27/2018 1233 09/03/2018 1430 DNR [id number]  Jonah Blue, MD ED   08/06/2018 1239 08/23/2018 1442 DNR [id number]  Charlton Amor, PA-C Inpatient   08/06/2018 1239 08/06/2018 1239 Full Code [id number]  Charlton Amor, PA-C Inpatient   07/26/2018 0926 08/06/2018 1218 DNR [id number]   Luciano Cutter, MD Inpatient   07/24/2018 2041 07/26/2018 0926 Full Code [id number]  [place], Eric J, DO Inpatient   07/24/2018 1721 07/24/2018 2041 Full Code [id number]  Alwyn Ren, MD ED       Home/SNF/Other Home  Chief Complaint Choledocholithiasis [K80.50]  Level of Care/Admitting Diagnosis ED Disposition     ED Disposition  Admit   Condition  --   Comment  Hospital Area: [facility] [[id number]]  Level of Care: Telemetry [5]  Admit to tele based on following criteria: Monitor for Ischemic changes  May admit patient to [facility] or [name] if equivalent level of care is available:: No  Covid Evaluation: Asymptomatic - no recent exposure (last 10 days) testing not required  Diagnosis: Choledocholithiasis [818299]  Admitting Physician: Eduard Clos [phone]  Attending Physician: Eduard Clos [phone]  Certification:: I certify this patient will need inpatient services for at least 2 midnights  Expected Medical Readiness: 04/06/2023          Medical History Past Medical History:  Diagnosis Date   Bruit    Carotid Doppler showed no significant abnormality     9per patient)   C. difficile colitis 07/2018   with severe sepsis   Chest pain, unspecified    Nuclear, May, 2008, no scar or ischemia   Diabetes mellitus    Diverticulosis    Dyslipidemia  Ejection fraction    EF 55-60%, echo, February, 2011 // Echocardiogram 8/21: EF 60-65, no RWMA, Gr 1 DD, GLS -14%, normal RVSF, mild LAE, trivial MR, mild MS (mean 4 mmHg), RVSP 23.4    GERD (gastroesophageal reflux disease)    Mitral regurgitation 04/21/2009   mild,  echo, February, 2011   Osteoporosis    Palpitations    possible very brief atrial fibrillation on monitor and possible reentrant tachycardia   PONV (postoperative nausea and vomiting)    Psoriasis    Rheumatoid arthritis (HCC)     Allergies Allergies  Allergen Reactions   Cholestyramine     Possible- Makes  throat burn. Patient said she can't take it    Compazine [Prochlorperazine Edisylate] Swelling    REACTION: " tongue swell and unable to swallow"   Celecoxib Diarrhea   Sulfonamide Derivatives Other (See Comments)    REACTION: " broke out with fine itching bumps"   Hydrocodone Other (See Comments)   Infliximab Other (See Comments)    Other reaction(s): rash   Leflunomide Other (See Comments)    Other reaction(s): diarrhea   Prochlorperazine Other (See Comments)    Other reaction(s): Unknown  Other Reaction(s): tongue swells   Rosuvastatin     Other reaction(s): muscle aches   Sulfa Antibiotics Other (See Comments)    Other reaction(s): tongue swelling    IV Location/Drains/Wounds Patient Lines/Drains/Airways Status     Active Line/Drains/Airways     Name Placement date Placement time Site Days   Peripheral IV 04/04/23 20 G Anterior;Right Forearm 04/04/23  0340  Forearm  less than 1   Incision - 4 Ports Abdomen 1: Umbilicus 2: Mid;Upper 3: Left;Lower Right;Lower 08/09/22  1646  -- 238   Wound / Incision (Open or Dehisced) 01/29/22 Other (Comment) Chest Left;Upper 01/29/22  1149  Chest  430            Labs/Imaging Results for orders placed or performed during the hospital encounter of 04/03/23 (from the past 48 hours)  CBC with Differential     Status: Abnormal   Collection Time: 04/03/23  6:23 PM  Result Value Ref Range   WBC 11.4 (H) 4.0 - 10.5 K/uL   RBC 3.70 (L) 3.87 - 5.11 MIL/uL   Hemoglobin 11.3 (L) 12.0 - 15.0 g/dL   HCT 21.3 08.6 - 57.8 %   MCV 97.6 80.0 - 100.0 fL   MCH 30.5 26.0 - 34.0 pg   MCHC 31.3 30.0 - 36.0 g/dL   RDW 46.9 (H) 62.9 - 52.8 %   Platelets 227 150 - 400 K/uL   nRBC 0.0 0.0 - 0.2 %   Neutrophils Relative % 83 %   Neutro Abs 9.5 (H) 1.7 - 7.7 K/uL   Lymphocytes Relative 11 %   Lymphs Abs 1.2 0.7 - 4.0 K/uL   Monocytes Relative 4 %   Monocytes Absolute 0.5 0.1 - 1.0 K/uL   Eosinophils Relative 0 %   Eosinophils Absolute 0.0 0.0 - 0.5  K/uL   Basophils Relative 0 %   Basophils Absolute 0.0 0.0 - 0.1 K/uL   Immature Granulocytes 2 %   Abs Immature Granulocytes 0.18 (H) 0.00 - 0.07 K/uL    Comment: Performed at [facility], [address], [place], [place] [address]  Comprehensive metabolic panel     Status: Abnormal   Collection Time: 04/03/23  6:23 PM  Result Value Ref Range   Sodium 137 135 - 145 mmol/L   Potassium 5.7 (H) 3.5 -  5.1 mmol/L   Chloride 102 98 - 111 mmol/L   CO2 26 22 - 32 mmol/L   Glucose, Bld 310 (H) 70 - 99 mg/dL    Comment: Glucose reference range applies only to samples taken after fasting for at least 8 hours.   BUN 63 (H) 8 - 23 mg/dL   Creatinine, Ser 4.13 (H) 0.44 - 1.00 mg/dL   Calcium 8.6 (L) 8.9 - 10.3 mg/dL   Total Protein 6.2 (L) 6.5 - 8.1 g/dL   Albumin 3.3 (L) 3.5 - 5.0 g/dL   AST 26 15 - 41 U/L   ALT 59 (H) 0 - 44 U/L   Alkaline Phosphatase 77 38 - 126 U/L   Total Bilirubin 0.6 0.0 - 1.2 mg/dL   GFR, Estimated 33 (L) >60 mL/min    Comment: (NOTE) Calculated using the CKD-EPI Creatinine Equation (2021)    Anion gap 9 5 - 15    Comment: Performed at [facility], [address] [place], [place] [address]  Hepatic function panel     Status: Abnormal   Collection Time: 04/04/23  4:14 AM  Result Value Ref Range   Total Protein 6.2 (L) 6.5 - 8.1 g/dL   Albumin 3.4 (L) 3.5 - 5.0 g/dL   AST 23 15 - 41 U/L   ALT 53 (H) 0 - 44 U/L   Alkaline Phosphatase 72 38 - 126 U/L   Total Bilirubin 0.9 0.0 - 1.2 mg/dL   Bilirubin, Direct 0.1 0.0 - 0.2 mg/dL   Indirect Bilirubin 0.8 0.3 - 0.9 mg/dL    Comment: Performed at [facility], [address] [place], [place] [address]  Basic metabolic panel     Status: Abnormal   Collection Time: 04/04/23  4:14 AM  Result Value Ref Range   Sodium 142 135 - 145 mmol/L   Potassium 4.5 3.5 - 5.1 mmol/L   Chloride 107 98 - 111 mmol/L   CO2 26 22 - 32 mmol/L   Glucose, Bld 78 70 - 99 mg/dL     Comment: Glucose reference range applies only to samples taken after fasting for at least 8 hours.   BUN 55 (H) 8 - 23 mg/dL   Creatinine, Ser 3.66 (H) 0.44 - 1.00 mg/dL   Calcium 8.7 (L) 8.9 - 10.3 mg/dL   GFR, Estimated 38 (L) >60 mL/min    Comment: (NOTE) Calculated using the CKD-EPI Creatinine Equation (2021)    Anion gap 9 5 - 15    Comment: Performed at [facility], [address], [place], [place] [address]  CBC with Differential/Platelet     Status: Abnormal   Collection Time: 04/04/23  4:14 AM  Result Value Ref Range   WBC 9.9 4.0 - 10.5 K/uL   RBC 3.85 (L) 3.87 - 5.11 MIL/uL   Hemoglobin 11.7 (L) 12.0 - 15.0 g/dL   HCT 74.2 59.5 - 63.8 %   MCV 98.2 80.0 - 100.0 fL   MCH 30.4 26.0 - 34.0 pg   MCHC 31.0 30.0 - 36.0 g/dL   RDW 75.6 (H) 43.3 - 29.5 %   Platelets 209 150 - 400 K/uL   nRBC 0.0 0.0 - 0.2 %   Neutrophils Relative % 69 %   Neutro Abs 6.8 1.7 - 7.7 K/uL   Lymphocytes Relative 18 %   Lymphs Abs 1.7 0.7 - 4.0 K/uL   Monocytes Relative 10 %   Monocytes Absolute 1.0 0.1 - 1.0 K/uL   Eosinophils  Relative 1 %   Eosinophils Absolute 0.1 0.0 - 0.5 K/uL   Basophils Relative 0 %   Basophils Absolute 0.0 0.0 - 0.1 K/uL   Immature Granulocytes 2 %   Abs Immature Granulocytes 0.16 (H) 0.00 - 0.07 K/uL    Comment: Performed at [facility], [address], [place], [place] [address]  Lipase, blood     Status: None   Collection Time: 04/04/23  4:14 AM  Result Value Ref Range   Lipase 35 11 - 51 U/L    Comment: Performed at [facility], [address], [place], [place] [address]  CBG monitoring, ED     Status: Abnormal   Collection Time: 04/04/23 10:14 AM  Result Value Ref Range   Glucose-Capillary 173 (H) 70 - 99 mg/dL    Comment: Glucose reference range applies only to samples taken after fasting for at least 8 hours.  CBG monitoring, ED     Status: Abnormal   Collection Time: 04/04/23 11:49 AM  Result Value  Ref Range   Glucose-Capillary 188 (H) 70 - 99 mg/dL    Comment: Glucose reference range applies only to samples taken after fasting for at least 8 hours.   CT ABDOMEN PELVIS W CONTRAST Addendum Date: 04/03/2023 ADDENDUM REPORT: 04/03/2023 17:11 ADDENDUM: These results were called by telephone at the time of interpretation on 04/03/2023 at 5:11 pm to provider Dour Clelia Croft, who verbally acknowledged these results. Electronically Signed   By: Signa Kell M.D.   On: 04/03/2023 17:11   Result Date: 04/03/2023 CLINICAL DATA:  History of pancreatitis. Status post cholecystectomy. Complains of severe lower abdominal pain and distension. EXAM: CT ABDOMEN AND PELVIS WITH CONTRAST TECHNIQUE: Multidetector CT imaging of the abdomen and pelvis was performed using the standard protocol following bolus administration of intravenous contrast. RADIATION DOSE REDUCTION: This exam was performed according to the departmental dose-optimization program which includes automated exposure control, adjustment of the mA and/or kV according to patient size and/or use of iterative reconstruction technique. CONTRAST:  75mL ISOVUE-300 IOPAMIDOL (ISOVUE-300) INJECTION 61% COMPARISON:  MRI/MRCP from 08/10/2022.  CT AP from 08/07/2022. FINDINGS: Lower chest: Scarring noted within the lung bases. No pleural or pericardial effusion. Hepatobiliary: No focal liver abnormality. Status post cholecystectomy. The common bile duct measures 8 mm in diameter which is unchanged compared with the previous exam. No significant intrahepatic bile duct dilatation. Within the distal common bile duct there are 2 gallstones identified which measure up to 5 mm, image 39/4. Pancreas: Unremarkable. No pancreatic ductal dilatation or surrounding inflammatory changes. Spleen: Normal in size without focal abnormality. Adrenals/Urinary Tract: Normal adrenal glands. Bilateral Bosniak class 1 cysts are identified. The largest arises off the lateral cortex of the right  kidney measuring 2.8 cm, image 22/2. No follow-up imaging recommended. Arising off the upper pole of the right kidney is a exophytic cyst which measures 41 Hounsfield units and 1.3 cm. On the previous MRI this was uniformly T1 hyperintense and T2 hypointense compatible with a benign Bosniak class 2 cyst. No follow-up imaging recommended. No nephrolithiasis or obstructive uropathy. Urinary bladder appears normal. Stomach/Bowel: Stomach is normal. No pathologic dilatation of the large or small bowel loops to suggest a bowel obstruction. Fecalized distal small bowel loops are noted. No small bowel wall thickening, inflammation or distension. Mild to moderate stool burden noted throughout the colon. Severe sigmoid diverticulosis. No signs of acute diverticulitis. The appendix is visualized and appears normal. Vascular/Lymphatic: Aortic atherosclerosis without aneurysm. The upper abdominal vascularity  appears patent. Reproductive: Uterus and bilateral adnexa are unremarkable. Other: No free fluid or fluid collections. No signs of pneumoperitoneum. Musculoskeletal: Multilevel lumbar spondylosis identified. Anterolisthesis of L4 on L5 measures 5 mm. Mild superior endplate deformity involving the L1 vertebra is identified, new compared with the exam from 08/07/2022. There is mild retropulsion of the fracture fragments by approximately 3 mm. IMPRESSION: 1. Signs of choledocholithiasis status post cholecystectomy. There are 2 stones identified within the distal common bile duct which measure up to 5 mm. 2. Severe sigmoid diverticulosis without signs of acute diverticulitis. 3. Mild superior endplate deformity involving the L1 vertebra is identified, new compared with the exam from 08/07/2022. There is mild retropulsion of the fracture fragments by approximately 3 mm. 4.  Aortic Atherosclerosis (ICD10-I70.0). Electronically Signed: By: Signa Kell M.D. On: 04/03/2023 17:07    Pending Labs Unresulted Labs (From  admission, onward)     Start     Ordered   04/05/23 0500  Comprehensive metabolic panel  Tomorrow morning,   R        04/04/23 1045   04/05/23 0500  CBC  Tomorrow morning,   R        04/04/23 1045   04/05/23 0500  Magnesium  Tomorrow morning,   R        04/04/23 1045   04/05/23 0500  Phosphorus  Tomorrow morning,   R        04/04/23 1045   04/04/23 0932  Hemoglobin A1c  Once,   R       Comments: To assess prior glycemic control    04/04/23 0931            Vitals/Pain Today's Vitals   04/04/23 0930 04/04/23 0950 04/04/23 1247 04/04/23 1401  BP:  110/76  104/79  Pulse:  72  73  Resp:  18  18  Temp:  98.1 F (36.7 C)  98.1 F (36.7 C)  TempSrc:  Oral  Oral  SpO2:  95%  99%  Weight:      Height:      PainSc: 5   3      Isolation Precautions No active isolations  Medications Medications  ondansetron (ZOFRAN) injection 4 mg (has no administration in time range)  fentaNYL (SUBLIMAZE) injection 25 mcg (25 mcg Intravenous Given 04/04/23 1401)  amiodarone (PACERONE) tablet 100 mg (100 mg Oral Given 04/04/23 0924)  lactated ringers infusion (has no administration in time range)  hydrocortisone sodium succinate (SOLU-CORTEF) 100 MG injection 50 mg (50 mg Intravenous Given 04/04/23 0923)  insulin aspart (novoLOG) injection 0-9 Units ( Subcutaneous Not Given 04/04/23 1210)  lactated ringers bolus 1,000 mL (0 mLs Intravenous Stopped 04/04/23 0729)  fentaNYL (SUBLIMAZE) injection 100 mcg (100 mcg Intravenous Given 04/03/23 2212)  insulin aspart (novoLOG) injection 4 Units (4 Units Subcutaneous Given 04/03/23 2213)    Mobility non-ambulatory

## 2023-04-04 NOTE — H&P (Addendum)
History and Physical    Shelby Walker BJY:[id number] DOB: May 11, 1933 DOA: 04/03/2023  Patient coming from: Home.  Chief Complaint: Abdominal pain.  HPI: Shelby Walker is a 89 y.o. female with history of diabetes mellitus type 2, paroxysmal atrial fibrillation, hyperlipidemia, GERD, chronic kidney disease stage III, rheumatoid arthritis, anemia who was admitted in June 2024 for acute biliary pancreatitis at that time patient also was septic eventually underwent cholecystectomy has been experiencing abdominal pain and low back pain for the last week.  Had initially gone to her primary care physician was treated for possible musculoskeletal pain but since patient's pain was not getting better primary care are ordered CT scan which showed 2 small stones in the CBD and was advised to come to the ER.  Patient's pain is across the abdomen and low back.  Denies any nausea vomiting fever or chills.  ED Course: In the ER patient was afebrile.  Labs show total bilirubin of 0.6 AST 26 ALT 59 alkaline phosphatase 77.  WBC count is 7.4.  Patient was started on fluids pain relief medications and admitted for choledocholithiasis.  GI has been consulted.  Review of Systems: As per HPI, rest all negative.   Past Medical History:  Diagnosis Date   Bruit    Carotid Doppler showed no significant abnormality     9per patient)   C. difficile colitis 07/2018   with severe sepsis   Chest pain, unspecified    Nuclear, May, 2008, no scar or ischemia   Diabetes mellitus    Diverticulosis    Dyslipidemia    Ejection fraction    EF 55-60%, echo, February, 2011 // Echocardiogram 8/21: EF 60-65, no RWMA, Gr 1 DD, GLS -14%, normal RVSF, mild LAE, trivial MR, mild MS (mean 4 mmHg), RVSP 23.4    GERD (gastroesophageal reflux disease)    Mitral regurgitation 04/21/2009   mild,  echo, February, 2011   Osteoporosis    Palpitations    possible very brief atrial fibrillation on monitor and possible reentrant tachycardia    PONV (postoperative nausea and vomiting)    Psoriasis    Rheumatoid arthritis (HCC)     Past Surgical History:  Procedure Laterality Date   ABDOMINAL AORTOGRAM W/LOWER EXTREMITY Right 10/19/2020   Procedure: ABDOMINAL AORTOGRAM W/LOWER EXTREMITY;  Surgeon: Iran Ouch, MD;  Location: [facility];  Service: Cardiovascular;  Laterality: Right;   CHOLECYSTECTOMY N/A 08/09/2022   Procedure: LAPAROSCOPIC CHOLECYSTECTOMY WITH INTRAOPERATIVE CHOLANGIOGRAM;  Surgeon: Quentin Ore, MD;  Location: [facility] OR;  Service: General;  Laterality: N/A;   FRACTURE SURGERY     LOOP RECORDER REMOVAL N/A 01/05/2022   Procedure: LOOP RECORDER REMOVAL;  Surgeon: Marinus Maw, MD;  Location: [facility];  Service: Cardiovascular;  Laterality: N/A;   PACEMAKER IMPLANT N/A 01/05/2022   Procedure: PACEMAKER IMPLANT;  Surgeon: Marinus Maw, MD;  Location: [facility];  Service: Cardiovascular;  Laterality: N/A;   PPM GENERATOR REMOVAL N/A 01/29/2022   Procedure: PPM GENERATOR REMOVAL;  Surgeon: Marinus Maw, MD;  Location: [facility];  Service: Cardiovascular;  Laterality: N/A;   TEE WITHOUT CARDIOVERSION N/A 01/29/2022   Procedure: TRANSESOPHAGEAL ECHOCARDIOGRAM (TEE);  Surgeon: Sande Rives, MD;  Location: [facility] ENDOSCOPY;  Service: Cardiovascular;  Laterality: N/A;   TRANSMETATARSAL AMPUTATION Right 01/18/2021   Procedure: TRANSMETATARSAL AMPUTATION;  Surgeon: Toni Arthurs, MD;  Location: [facility] OR;  Service: Orthopedics;  Laterality: Right;     reports that she has  never smoked. She has never used smokeless tobacco. She reports that she does not drink alcohol and does not use drugs.  Allergies  Allergen Reactions   Cholestyramine     Possible- Makes throat burn. Patient said she can't take it    Compazine [Prochlorperazine Edisylate] Swelling    REACTION: " tongue swell and unable to swallow"   Celecoxib Diarrhea   Sulfonamide Derivatives Other (See Comments)     REACTION: " broke out with fine itching bumps"   Hydrocodone Other (See Comments)   Infliximab Other (See Comments)    Other reaction(s): rash   Leflunomide Other (See Comments)    Other reaction(s): diarrhea   Prochlorperazine Other (See Comments)    Other reaction(s): Unknown  Other Reaction(s): tongue swells   Rosuvastatin     Other reaction(s): muscle aches   Sulfa Antibiotics Other (See Comments)    Other reaction(s): tongue swelling    Family History  Problem Relation Age of Onset   Heart failure Father    Heart attack Father    Diabetes Father    Heart attack Mother    Heart failure Mother    Diabetes Mother    Stroke Sister     Prior to Admission medications   Medication Sig Start Date End Date Taking? Authorizing Provider  acetaminophen (TYLENOL) 325 MG tablet Take 650 mg by mouth every 6 (six) hours as needed for moderate pain (pain score 4-6).   Yes [provider]  acetaminophen-codeine (TYLENOL #3) 300-30 MG tablet Take 1 tablet by mouth every 4 (four) hours as needed for moderate pain (pain score 4-6).   Yes [provider]  amiodarone (PACERONE) 200 MG tablet Take 0.5 tablets (100 mg total) by mouth daily. 09/10/22  Yes Duke Salvia, MD  atorvastatin (LIPITOR) 20 MG tablet Take 1 tablet by mouth once daily 02/25/23  Yes Duke Salvia, MD  Calcium Carbonate-Vitamin D 600-400 MG-UNIT tablet Take 1 tablet by mouth daily at 12 noon.    Yes [provider]  diclofenac Sodium (VOLTAREN) 1 % GEL Apply 4 g topically 4 (four) times daily. Patient taking differently: Apply 4 g topically 4 (four) times daily as needed. 02/15/22  Yes Setzer, Lynnell Jude, PA-C  esomeprazole (NEXIUM) 20 MG capsule Take 20 mg by mouth daily at 12 noon.   Yes [provider]  folic acid (FOLVITE) 1 MG tablet Take 1 mg by mouth daily.   Yes [provider]  insulin aspart (NOVOLOG) 100 UNIT/ML FlexPen Inject 2 Units into the skin 3 (three) times daily  with meals. Sliding scale Patient taking differently: Inject 5-8 Units into the skin 3 (three) times daily with meals. Sliding scale 02/15/22  Yes Setzer, Lynnell Jude, PA-C  Insulin Degludec (TRESIBA) 100 UNIT/ML SOLN Inject 4 Units into the skin at bedtime. Patient taking differently: Inject 6 Units into the skin at bedtime. 02/15/22  Yes Setzer, Lynnell Jude, PA-C  iron polysaccharides (NIFEREX) 150 MG capsule Take 150 mg by mouth daily.   Yes [provider]  methocarbamol (ROBAXIN) 500 MG tablet Take 500 mg by mouth as needed for muscle spasms.   Yes [provider]  methotrexate 50 MG/2ML injection Inject 0.4 mLs into the muscle once a week. 07/26/22  Yes [provider]  mirtazapine (REMERON) 7.5 MG tablet Take 7.5 mg by mouth at bedtime.   Yes [provider]  Multiple Vitamins-Minerals (CENTRUM SILVER 50+WOMEN PO) Take 1 tablet by mouth daily.   Yes [provider]  nitroGLYCERIN (NITROSTAT) 0.4 MG SL tablet Dissolve 1 tablet under the tongue every 5 minutes as needed for chest pain. Max of 3 doses, then 911. 07/23/22  Yes Nahser, Deloris Ping, MD  predniSONE (DELTASONE) 5 MG tablet Take 1 tablet (5 mg total) by mouth daily with breakfast. 02/15/22  Yes Setzer, Lynnell Jude, PA-C  pregabalin (LYRICA) 50 MG capsule Take 50 mg by mouth in the morning.   Yes [provider]  pregabalin (LYRICA) 75 MG capsule Take 75 mg by mouth at bedtime.   Yes [provider]  protein supplement shake (PREMIER PROTEIN) LIQD Take 2 oz by mouth 2 (two) times daily.   Yes [provider]  pyrithione zinc (SELSUN BLUE DRY SCALP) 1 % shampoo Apply 1 application topically once a week. 04/15/15  Yes [provider]  RESTASIS 0.05 % ophthalmic emulsion Place 1 drop into both eyes 2 (two) times daily. 02/13/19  Yes [provider]  traMADol (ULTRAM) 50 MG tablet Take 50 mg by mouth every 6 (six) hours as needed for moderate pain (pain score 4-6).    Yes [provider]    Physical Exam: Constitutional: Moderately built and nourished. Vitals:   04/03/23 2107 04/03/23 2111 04/03/23 2130 04/03/23 2353  BP: (!) 174/60 (!) 174/60 (!) 145/66 (!) 98/52  Pulse:  70 60 60  Resp: 18 18 17 11   Temp: 98.2 F (36.8 C) 98.2 F (36.8 C)  98 F (36.7 C)  TempSrc:  Oral  Oral  SpO2:  98% 96% 97%  Weight:      Height:       Eyes: Anicteric no pallor. ENMT: No discharge from the ears eyes nose or mouth. Neck: No mass felt.  No neck rigidity. Respiratory: No rhonchi or crepitations. Cardiovascular: S1-S2 heard. Abdomen: Epigastric tenderness no guarding or rigidity. Musculoskeletal: No edema. Skin: Wound of the left leg. Neurologic: Alert awake oriented to time place and person.  Moves all extremities. Psychiatric: Appears normal.  Normal affect.   Labs on Admission: I have personally reviewed following labs and imaging studies  CBC: Recent Labs  Lab 04/03/23 1823  WBC 11.4*  NEUTROABS 9.5*  HGB 11.3*  HCT 36.1  MCV 97.6  PLT 227   Basic Metabolic Panel: Recent Labs  Lab 04/03/23 1823  NA 137  K 5.7*  CL 102  CO2 26  GLUCOSE 310*  BUN 63*  CREATININE 1.51*  CALCIUM 8.6*   GFR: Estimated Creatinine Clearance: 26.8 mL/min (A) (by C-G formula based on SCr of 1.51 mg/dL (H)). Liver Function Tests: Recent Labs  Lab 04/03/23 1823  AST 26  ALT 59*  ALKPHOS 77  BILITOT 0.6  PROT 6.2*  ALBUMIN 3.3*   No results for input(s): "LIPASE", "AMYLASE" in the last 168 hours. No results for input(s): "AMMONIA" in the last 168 hours. Coagulation Profile: No results for input(s): "INR", "PROTIME" in the last 168 hours. Cardiac Enzymes: No results for input(s): "CKTOTAL", "CKMB", "CKMBINDEX", "TROPONINI" in the last 168 hours. BNP (last 3 results) No results for input(s): "PROBNP" in the last 8760 hours. HbA1C: No results for input(s): "HGBA1C" in the last 72 hours. CBG: No results for input(s): "GLUCAP" in the  last 168 hours. Lipid Profile: No results for input(s): "CHOL", "HDL", "LDLCALC", "TRIG", "CHOLHDL", "LDLDIRECT" in the last 72 hours. Thyroid Function Tests: No results for input(s): "TSH", "T4TOTAL", "FREET4", "T3FREE", "THYROIDAB" in the last 72 hours. Anemia Panel: No results for input(s): "VITAMINB12", "FOLATE", "FERRITIN", "TIBC", "IRON", "RETICCTPCT" in  the last 72 hours. Urine analysis:    Component Value Date/Time   COLORURINE YELLOW 02/04/2022 0730   APPEARANCEUR CLOUDY (A) 02/04/2022 0730   LABSPEC 1.008 02/04/2022 0730   PHURINE 7.0 02/04/2022 0730   GLUCOSEU 50 (A) 02/04/2022 0730   HGBUR SMALL (A) 02/04/2022 0730   BILIRUBINUR NEGATIVE 02/04/2022 0730   KETONESUR NEGATIVE 02/04/2022 0730   PROTEINUR 30 (A) 02/04/2022 0730   NITRITE NEGATIVE 02/04/2022 0730   LEUKOCYTESUR LARGE (A) 02/04/2022 0730   Sepsis Labs: @LABRCNTIP (procalcitonin:4,lacticidven:4) )No results found for this or any previous visit (from the past 240 hours).   Radiological Exams on Admission: CT ABDOMEN PELVIS W CONTRAST Addendum Date: 04/03/2023 ADDENDUM REPORT: 04/03/2023 17:11 ADDENDUM: These results were called by telephone at the time of interpretation on 04/03/2023 at 5:11 pm to provider Dour Clelia Croft, who verbally acknowledged these results. Electronically Signed   By: Signa Kell M.D.   On: 04/03/2023 17:11   Result Date: 04/03/2023 CLINICAL DATA:  History of pancreatitis. Status post cholecystectomy. Complains of severe lower abdominal pain and distension. EXAM: CT ABDOMEN AND PELVIS WITH CONTRAST TECHNIQUE: Multidetector CT imaging of the abdomen and pelvis was performed using the standard protocol following bolus administration of intravenous contrast. RADIATION DOSE REDUCTION: This exam was performed according to the departmental dose-optimization program which includes automated exposure control, adjustment of the mA and/or kV according to patient size and/or use of iterative reconstruction  technique. CONTRAST:  75mL ISOVUE-300 IOPAMIDOL (ISOVUE-300) INJECTION 61% COMPARISON:  MRI/MRCP from 08/10/2022.  CT AP from 08/07/2022. FINDINGS: Lower chest: Scarring noted within the lung bases. No pleural or pericardial effusion. Hepatobiliary: No focal liver abnormality. Status post cholecystectomy. The common bile duct measures 8 mm in diameter which is unchanged compared with the previous exam. No significant intrahepatic bile duct dilatation. Within the distal common bile duct there are 2 gallstones identified which measure up to 5 mm, image 39/4. Pancreas: Unremarkable. No pancreatic ductal dilatation or surrounding inflammatory changes. Spleen: Normal in size without focal abnormality. Adrenals/Urinary Tract: Normal adrenal glands. Bilateral Bosniak class 1 cysts are identified. The largest arises off the lateral cortex of the right kidney measuring 2.8 cm, image 22/2. No follow-up imaging recommended. Arising off the upper pole of the right kidney is a exophytic cyst which measures 41 Hounsfield units and 1.3 cm. On the previous MRI this was uniformly T1 hyperintense and T2 hypointense compatible with a benign Bosniak class 2 cyst. No follow-up imaging recommended. No nephrolithiasis or obstructive uropathy. Urinary bladder appears normal. Stomach/Bowel: Stomach is normal. No pathologic dilatation of the large or small bowel loops to suggest a bowel obstruction. Fecalized distal small bowel loops are noted. No small bowel wall thickening, inflammation or distension. Mild to moderate stool burden noted throughout the colon. Severe sigmoid diverticulosis. No signs of acute diverticulitis. The appendix is visualized and appears normal. Vascular/Lymphatic: Aortic atherosclerosis without aneurysm. The upper abdominal vascularity appears patent. Reproductive: Uterus and bilateral adnexa are unremarkable. Other: No free fluid or fluid collections. No signs of pneumoperitoneum. Musculoskeletal: Multilevel lumbar  spondylosis identified. Anterolisthesis of L4 on L5 measures 5 mm. Mild superior endplate deformity involving the L1 vertebra is identified, new compared with the exam from 08/07/2022. There is mild retropulsion of the fracture fragments by approximately 3 mm. IMPRESSION: 1. Signs of choledocholithiasis status post cholecystectomy. There are 2 stones identified within the distal common bile duct which measure up to 5 mm. 2. Severe sigmoid diverticulosis without signs of acute diverticulitis. 3. Mild superior endplate deformity involving  the L1 vertebra is identified, new compared with the exam from 08/07/2022. There is mild retropulsion of the fracture fragments by approximately 3 mm. 4.  Aortic Atherosclerosis (ICD10-I70.0). Electronically Signed: By: Signa Kell M.D. On: 04/03/2023 17:07    EKG: Independently reviewed.  Sinus rhythm.  Assessment/Plan Principal Problem:   Choledocholithiasis Active Problems:   Diabetes mellitus (HCC)   PAD (peripheral artery disease) (HCC)   Paroxysmal atrial fibrillation (HCC)   Rheumatoid arthritis (HCC)   Stage 3b chronic kidney disease (CKD) (HCC)   DNR (do not resuscitate)   Elevated LFTs   Anemia    Choledocholithiasis -     no signs of any cholangitis at this time.  Will keep patient n.p.o. and consult GI.  Will repeat LFTs.  Total bilirubin and alkaline phosphatase were normal in the first test. L1 vertebral endplate fracture seen the CAT scan will need follow-up. Diabetes mellitus type 2 takes [name] 6 units for now we will keep patient on sliding scale coverage.  Last hemoglobin A1c was 9.2   7 months ago. History of paroxysmal atrial fibrillation on amiodarone.  Not on anticoagulation with risk of falls. History of rheumatoid arthritis takes methotrexate weekly along with prednisone daily.  Presently on stress dose steroids. Chronic anemia likely from renal disease.  Follow CBC. Chronic kidney disease stage III creatinine at her baseline.   Will repeat metabolic panel to make sure the potassium is improving.  Initial potassium was 5.7. History of GERD on PPI. Chronic nonhealing wound of the left lower extremity.  Since patient has choledocholithiasis will need close monitoring and further workup and more than 2 midnight stay in inpatient status.   DVT prophylaxis: SCDs. Code Status: DNR. Family Communication: Patient's son and daughter at the bedside. Disposition Plan: Medical floor. Consults called: [facility]. Admission status: Inpatient.

## 2023-04-04 NOTE — Consult Note (Signed)
Consultation  Referring Provider:   [facility] Primary Care Physician:  Adrian Prince, MD Primary Gastroenterologist:  Dr. Leone Payor       Reason for Consultation:     Choledocholithiasis DOA: 04/03/2023         Hospital Day: 2         HPI:   Shelby Walker is a 89 y.o. female with past medical history significant for type 2 diabetes, paroxysmal atrial fibrillation history of 25-second pause followed by pacemaker later removed secondary to sepsis with MSSA bacteremia, hyperlipidemia, GERD, CKD stage III, RA, anemia, history of recurrent C. difficile, gallstone pancreatitis.   Of note patient had admission 08/08/2022 with gallstone pancreatitis.  CT at the time showed cholelithiasis evidence of gallbladder wall thickening and wall edema pancreas was unremarkable.  Status post laparoscopic cholecystectomy 08/09/2022 with IOC suspicious for distal common bile duct stone MRCP negative without evidence of CBD stone and CBD 6 mm.  LFTs were trending down and patient was discharged home.  Patient presents to the ER with abdominal pain and lower back pain.  Had outpatient workup with CT due to a weeks worth of back pain.  CT abdomen pelvis showed 2 small stones in CBD which measure up to 5 mm , was advised to come to the ER. Labs in the ER showed WBC 11.4 with left shift, hemoglobin 11.3 Glucose 310 BUN 63, creatinine 1.51, potassium 5.7,  Albumin 3.3, AST 26, ALT 59, alk phos 77, total bili 0.6 Repeat labs show improving WBC 9.9, potassium resolved 4.5, BUN improved with IVF 55, creatinine 1.34, repeat LFTs show AST 23, ALT 53, alk phos 72 and total bilirubin 0.9, lipase 35  Patient with family at bedside, [name] and [name], her children. Provided some of the history.  Patient oriented but poor historian Patient is complaining most of lower back pain can alternate between left hip and right hip no numbness and tingling, weakness.  She had slightly decreased urine output but had recent negative urine culture  with primary care.  Per daughter has consistently dark urine she does not drink enough fluids but nothing is worse than normal.  Patient was initially treated for constipation seen on KUB with MiraLAX last stool was night before last hard stool denies any melena, hematochezia or pale stools.  Patient was also treated recently with low-dose prednisone taper for her back pain which did not help. She does have some minor alternating back and abdominal pain but denies any right upper quadrant abdominal pain, upper back pain, nausea, vomiting, fever, chills.  Patient does have some decreased appetite but overall this is unchanged.  CT IMPRESSION: 1. Signs of choledocholithiasis status post cholecystectomy. There are 2 stones identified within the distal common bile duct which measure up to 5 mm. 2. Severe sigmoid diverticulosis without signs of acute diverticulitis. 3. Mild superior endplate deformity involving the L1 vertebra is identified, new compared with the exam from 08/07/2022. There is mild retropulsion of the fracture fragments by approximately 3 mm. 4.  Aortic Atherosclerosis (ICD10-I70.0).  Abnormal ED labs: Abnormal Labs Reviewed  CBC WITH DIFFERENTIAL/PLATELET - Abnormal; Notable for the following components:      Result Value   WBC 11.4 (*)    RBC 3.70 (*)    Hemoglobin 11.3 (*)    RDW 17.6 (*)    Neutro Abs 9.5 (*)    Abs Immature Granulocytes 0.18 (*)    All other components within normal limits  COMPREHENSIVE METABOLIC PANEL -  Abnormal; Notable for the following components:   Potassium 5.7 (*)    Glucose, Bld 310 (*)    BUN 63 (*)    Creatinine, Ser 1.51 (*)    Calcium 8.6 (*)    Total Protein 6.2 (*)    Albumin 3.3 (*)    ALT 59 (*)    GFR, Estimated 33 (*)    All other components within normal limits  HEPATIC FUNCTION PANEL - Abnormal; Notable for the following components:   Total Protein 6.2 (*)    Albumin 3.4 (*)    ALT 53 (*)    All other components within  normal limits  BASIC METABOLIC PANEL - Abnormal; Notable for the following components:   BUN 55 (*)    Creatinine, Ser 1.34 (*)    Calcium 8.7 (*)    GFR, Estimated 38 (*)    All other components within normal limits  CBC WITH DIFFERENTIAL/PLATELET - Abnormal; Notable for the following components:   RBC 3.85 (*)    Hemoglobin 11.7 (*)    RDW 17.8 (*)    Abs Immature Granulocytes 0.16 (*)    All other components within normal limits    Past Medical History:  Diagnosis Date   Bruit    Carotid Doppler showed no significant abnormality     9per patient)   C. difficile colitis 07/2018   with severe sepsis   Chest pain, unspecified    Nuclear, May, 2008, no scar or ischemia   Diabetes mellitus    Diverticulosis    Dyslipidemia    Ejection fraction    EF 55-60%, echo, February, 2011 // Echocardiogram 8/21: EF 60-65, no RWMA, Gr 1 DD, GLS -14%, normal RVSF, mild LAE, trivial MR, mild MS (mean 4 mmHg), RVSP 23.4    GERD (gastroesophageal reflux disease)    Mitral regurgitation 04/21/2009   mild,  echo, February, 2011   Osteoporosis    Palpitations    possible very brief atrial fibrillation on monitor and possible reentrant tachycardia   PONV (postoperative nausea and vomiting)    Psoriasis    Rheumatoid arthritis (HCC)     Surgical History:  She  has a past surgical history that includes Fracture surgery; ABDOMINAL AORTOGRAM W/LOWER EXTREMITY (Right, 10/19/2020); Transmetatarsal amputation (Right, 01/18/2021); PACEMAKER IMPLANT (N/A, 01/05/2022); LOOP RECORDER REMOVAL (N/A, 01/05/2022); PPM GENERATOR REMOVAL (N/A, 01/29/2022); TEE without cardioversion (N/A, 01/29/2022); and Cholecystectomy (N/A, 08/09/2022). Family History:  Her family history includes Diabetes in her father and mother; Heart attack in her father and mother; Heart failure in her father and mother; Stroke in her sister. Social History:   reports that she has never smoked. She has never used smokeless tobacco. She  reports that she does not drink alcohol and does not use drugs.  Prior to Admission medications   Medication Sig Start Date End Date Taking? Authorizing Provider  acetaminophen (TYLENOL) 325 MG tablet Take 650 mg by mouth every 6 (six) hours as needed for moderate pain (pain score 4-6).   Yes [provider]  acetaminophen-codeine (TYLENOL #3) 300-30 MG tablet Take 1 tablet by mouth every 4 (four) hours as needed for moderate pain (pain score 4-6).   Yes [provider]  amiodarone (PACERONE) 200 MG tablet Take 0.5 tablets (100 mg total) by mouth daily. 09/10/22  Yes Duke Salvia, MD  atorvastatin (LIPITOR) 20 MG tablet Take 1 tablet by mouth once daily 02/25/23  Yes Duke Salvia, MD  Calcium Carbonate-Vitamin D 600-400 MG-UNIT tablet Take 1  tablet by mouth daily at 12 noon.    Yes [provider]  diclofenac Sodium (VOLTAREN) 1 % GEL Apply 4 g topically 4 (four) times daily. Patient taking differently: Apply 4 g topically 4 (four) times daily as needed. 02/15/22  Yes Setzer, Lynnell Jude, PA-C  esomeprazole (NEXIUM) 20 MG capsule Take 20 mg by mouth daily at 12 noon.   Yes [provider]  folic acid (FOLVITE) 1 MG tablet Take 1 mg by mouth daily.   Yes [provider]  insulin aspart (NOVOLOG) 100 UNIT/ML FlexPen Inject 2 Units into the skin 3 (three) times daily with meals. Sliding scale Patient taking differently: Inject 5-8 Units into the skin 3 (three) times daily with meals. Sliding scale 02/15/22  Yes Setzer, Lynnell Jude, PA-C  Insulin Degludec (TRESIBA) 100 UNIT/ML SOLN Inject 4 Units into the skin at bedtime. Patient taking differently: Inject 6 Units into the skin at bedtime. 02/15/22  Yes Setzer, Lynnell Jude, PA-C  iron polysaccharides (NIFEREX) 150 MG capsule Take 150 mg by mouth daily.   Yes [provider]  methocarbamol (ROBAXIN) 500 MG tablet Take 500 mg by mouth as needed for muscle spasms.   Yes [provider]   methotrexate 50 MG/2ML injection Inject 0.4 mLs into the muscle once a week. 07/26/22  Yes [provider]  mirtazapine (REMERON) 7.5 MG tablet Take 7.5 mg by mouth at bedtime.   Yes [provider]  Multiple Vitamins-Minerals (CENTRUM SILVER 50+WOMEN PO) Take 1 tablet by mouth daily.   Yes [provider]  nitroGLYCERIN (NITROSTAT) 0.4 MG SL tablet Dissolve 1 tablet under the tongue every 5 minutes as needed for chest pain. Max of 3 doses, then 911. 07/23/22  Yes Nahser, Deloris Ping, MD  predniSONE (DELTASONE) 5 MG tablet Take 1 tablet (5 mg total) by mouth daily with breakfast. 02/15/22  Yes Setzer, Lynnell Jude, PA-C  pregabalin (LYRICA) 50 MG capsule Take 50 mg by mouth in the morning.   Yes [provider]  pregabalin (LYRICA) 75 MG capsule Take 75 mg by mouth at bedtime.   Yes [provider]  protein supplement shake (PREMIER PROTEIN) LIQD Take 2 oz by mouth 2 (two) times daily.   Yes [provider]  pyrithione zinc (SELSUN BLUE DRY SCALP) 1 % shampoo Apply 1 application topically once a week. 04/15/15  Yes [provider]  RESTASIS 0.05 % ophthalmic emulsion Place 1 drop into both eyes 2 (two) times daily. 02/13/19  Yes [provider]  traMADol (ULTRAM) 50 MG tablet Take 50 mg by mouth every 6 (six) hours as needed for moderate pain (pain score 4-6).   Yes [provider]    Current Facility-Administered Medications  Medication Dose Route Frequency Provider Last Rate Last Admin   amiodarone (PACERONE) tablet 100 mg  100 mg Oral Daily Eduard Clos, MD       fentaNYL (SUBLIMAZE) injection 25 mcg  25 mcg Intravenous Q2H PRN Eduard Clos, MD   25 mcg at 04/04/23 0411   hydrocortisone sodium succinate (SOLU-CORTEF) 100 MG injection 50 mg  50 mg Intravenous Q8H Eduard Clos, MD   50 mg at 04/04/23 0410   lactated ringers infusion   Intravenous Continuous Eduard Clos, MD       ondansetron  [facility]) injection 4 mg  4 mg Intravenous Q6H PRN Eduard Clos, MD       Current Outpatient Medications  Medication Sig Dispense Refill   acetaminophen (TYLENOL)  325 MG tablet Take 650 mg by mouth every 6 (six) hours as needed for moderate pain (pain score 4-6).     acetaminophen-codeine (TYLENOL #3) 300-30 MG tablet Take 1 tablet by mouth every 4 (four) hours as needed for moderate pain (pain score 4-6).     amiodarone (PACERONE) 200 MG tablet Take 0.5 tablets (100 mg total) by mouth daily. 15 tablet 11   atorvastatin (LIPITOR) 20 MG tablet Take 1 tablet by mouth once daily 90 tablet 1   Calcium Carbonate-Vitamin D 600-400 MG-UNIT tablet Take 1 tablet by mouth daily at 12 noon.      diclofenac Sodium (VOLTAREN) 1 % GEL Apply 4 g topically 4 (four) times daily. (Patient taking differently: Apply 4 g topically 4 (four) times daily as needed.)     esomeprazole (NEXIUM) 20 MG capsule Take 20 mg by mouth daily at 12 noon.     folic acid (FOLVITE) 1 MG tablet Take 1 mg by mouth daily.     insulin aspart (NOVOLOG) 100 UNIT/ML FlexPen Inject 2 Units into the skin 3 (three) times daily with meals. Sliding scale (Patient taking differently: Inject 5-8 Units into the skin 3 (three) times daily with meals. Sliding scale) 15 mL 11   Insulin Degludec (TRESIBA) 100 UNIT/ML SOLN Inject 4 Units into the skin at bedtime. (Patient taking differently: Inject 6 Units into the skin at bedtime.)     iron polysaccharides (NIFEREX) 150 MG capsule Take 150 mg by mouth daily.     methocarbamol (ROBAXIN) 500 MG tablet Take 500 mg by mouth as needed for muscle spasms.     methotrexate 50 MG/2ML injection Inject 0.4 mLs into the muscle once a week.     mirtazapine (REMERON) 7.5 MG tablet Take 7.5 mg by mouth at bedtime.     Multiple Vitamins-Minerals (CENTRUM SILVER 50+WOMEN PO) Take 1 tablet by mouth daily.     nitroGLYCERIN (NITROSTAT) 0.4 MG SL tablet Dissolve 1 tablet under the tongue every 5 minutes as needed for  chest pain. Max of 3 doses, then 911. 25 tablet 3   predniSONE (DELTASONE) 5 MG tablet Take 1 tablet (5 mg total) by mouth daily with breakfast. 30 tablet 0   pregabalin (LYRICA) 50 MG capsule Take 50 mg by mouth in the morning.     pregabalin (LYRICA) 75 MG capsule Take 75 mg by mouth at bedtime.     protein supplement shake (PREMIER PROTEIN) LIQD Take 2 oz by mouth 2 (two) times daily.     pyrithione zinc (SELSUN BLUE DRY SCALP) 1 % shampoo Apply 1 application topically once a week.     RESTASIS 0.05 % ophthalmic emulsion Place 1 drop into both eyes 2 (two) times daily.     traMADol (ULTRAM) 50 MG tablet Take 50 mg by mouth every 6 (six) hours as needed for moderate pain (pain score 4-6).      Allergies as of 04/03/2023 - Review Complete 04/03/2023  Allergen Reaction Noted   Cholestyramine  09/04/2018   Compazine [prochlorperazine edisylate] Swelling 08/01/2012   Celecoxib Diarrhea 07/23/2022   Sulfonamide derivatives Other (See Comments)    Hydrocodone Other (See Comments) 01/11/2021   Infliximab Other (See Comments) 01/11/2021   Leflunomide Other (See Comments) 05/17/2021   Prochlorperazine Other (See Comments) 01/11/2021   Rosuvastatin  01/11/2021   Sulfa antibiotics Other (See Comments) 01/11/2021    Review of Systems:    Constitutional: No weight loss, fever, chills, weakness or fatigue HEENT: Eyes: No change in vision  Ears, Nose, Throat:  No change in hearing or congestion Skin: No rash or itching Cardiovascular: No chest pain, chest pressure or palpitations   Respiratory: No SOB or cough Gastrointestinal: See HPI and otherwise negative Genitourinary: No dysuria or change in urinary frequency Neurological: No headache, dizziness or syncope Musculoskeletal: No new muscle or joint pain Hematologic: No bleeding or bruising Psychiatric: No history of depression or anxiety     Physical Exam:  Vital signs in last 24 hours: Temp:  [98 F (36.7 C)-98.2 F (36.8  C)] 98.2 F (36.8 C) (01/30 0630) Pulse Rate:  [60-70] 62 (01/30 0630) Resp:  [11-22] 14 (01/30 0700) BP: (98-174)/(49-81) 113/54 (01/30 0700) SpO2:  [95 %-98 %] 95 % (01/30 0630) Weight:  [79.8 kg] 79.8 kg (01/29 1801)   Last BM recorded by nurses in past 5 days No data recorded  General:   Elderly female in no acute distress Head:  Normocephalic and atraumatic. Eyes: sclerae anicteric,conjunctive pink  Heart:  regular rate and rhythm, systolic murmur Pulm: Clear anteriorly; no wheezing Abdomen: Soft abdomen, epigastric tenderness mild no rebound, no right upper quadrant tenderness negative Murphy's.  Lower abdomen diffuse abdominal discomfort no suprapubic discomfort.  No organomegaly. Extremities: Bilateral stasis dermatitis, right foot amputation, ulnar deviation of bilateral hands, full ROM. Msk:  Symmetrical without gross deformities. Peripheral pulses intact.  Neurologic:  Alert and  oriented x4;  No focal deficits.  Skin:   Dry and intact without significant lesions or rashes. Psychiatric:  Cooperative. Normal mood and affect.  LAB RESULTS: Recent Labs    04/03/23 1823 04/04/23 0414  WBC 11.4* 9.9  HGB 11.3* 11.7*  HCT 36.1 37.8  PLT 227 209   BMET Recent Labs    04/03/23 1823 04/04/23 0414  NA 137 142  K 5.7* 4.5  CL 102 107  CO2 26 26  GLUCOSE 310* 78  BUN 63* 55*  CREATININE 1.51* 1.34*  CALCIUM 8.6* 8.7*   LFT Recent Labs    04/04/23 0414  PROT 6.2*  ALBUMIN 3.4*  AST 23  ALT 53*  ALKPHOS 72  BILITOT 0.9  BILIDIR 0.1  IBILI 0.8   PT/INR No results for input(s): "LABPROT", "INR" in the last 72 hours.  STUDIES: CT ABDOMEN PELVIS W CONTRAST Addendum Date: 04/03/2023 ADDENDUM REPORT: 04/03/2023 17:11 ADDENDUM: These results were called by telephone at the time of interpretation on 04/03/2023 at 5:11 pm to provider Dour Clelia Croft, who verbally acknowledged these results. Electronically Signed   By: Signa Kell M.D.   On: 04/03/2023 17:11   Result  Date: 04/03/2023 CLINICAL DATA:  History of pancreatitis. Status post cholecystectomy. Complains of severe lower abdominal pain and distension. EXAM: CT ABDOMEN AND PELVIS WITH CONTRAST TECHNIQUE: Multidetector CT imaging of the abdomen and pelvis was performed using the standard protocol following bolus administration of intravenous contrast. RADIATION DOSE REDUCTION: This exam was performed according to the departmental dose-optimization program which includes automated exposure control, adjustment of the mA and/or kV according to patient size and/or use of iterative reconstruction technique. CONTRAST:  75mL ISOVUE-300 IOPAMIDOL (ISOVUE-300) INJECTION 61% COMPARISON:  MRI/MRCP from 08/10/2022.  CT AP from 08/07/2022. FINDINGS: Lower chest: Scarring noted within the lung bases. No pleural or pericardial effusion. Hepatobiliary: No focal liver abnormality. Status post cholecystectomy. The common bile duct measures 8 mm in diameter which is unchanged compared with the previous exam. No significant intrahepatic bile duct dilatation. Within the distal common bile duct there are 2 gallstones identified which measure up to 5 mm,  image 39/4. Pancreas: Unremarkable. No pancreatic ductal dilatation or surrounding inflammatory changes. Spleen: Normal in size without focal abnormality. Adrenals/Urinary Tract: Normal adrenal glands. Bilateral Bosniak class 1 cysts are identified. The largest arises off the lateral cortex of the right kidney measuring 2.8 cm, image 22/2. No follow-up imaging recommended. Arising off the upper pole of the right kidney is a exophytic cyst which measures 41 Hounsfield units and 1.3 cm. On the previous MRI this was uniformly T1 hyperintense and T2 hypointense compatible with a benign Bosniak class 2 cyst. No follow-up imaging recommended. No nephrolithiasis or obstructive uropathy. Urinary bladder appears normal. Stomach/Bowel: Stomach is normal. No pathologic dilatation of the large or small bowel  loops to suggest a bowel obstruction. Fecalized distal small bowel loops are noted. No small bowel wall thickening, inflammation or distension. Mild to moderate stool burden noted throughout the colon. Severe sigmoid diverticulosis. No signs of acute diverticulitis. The appendix is visualized and appears normal. Vascular/Lymphatic: Aortic atherosclerosis without aneurysm. The upper abdominal vascularity appears patent. Reproductive: Uterus and bilateral adnexa are unremarkable. Other: No free fluid or fluid collections. No signs of pneumoperitoneum. Musculoskeletal: Multilevel lumbar spondylosis identified. Anterolisthesis of L4 on L5 measures 5 mm. Mild superior endplate deformity involving the L1 vertebra is identified, new compared with the exam from 08/07/2022. There is mild retropulsion of the fracture fragments by approximately 3 mm. IMPRESSION: 1. Signs of choledocholithiasis status post cholecystectomy. There are 2 stones identified within the distal common bile duct which measure up to 5 mm. 2. Severe sigmoid diverticulosis without signs of acute diverticulitis. 3. Mild superior endplate deformity involving the L1 vertebra is identified, new compared with the exam from 08/07/2022. There is mild retropulsion of the fracture fragments by approximately 3 mm. 4.  Aortic Atherosclerosis (ICD10-I70.0). Electronically Signed: By: Signa Kell M.D. On: 04/03/2023 17:07      Impression    Choledocholithiasis status post cholecystectomy 08/09/2022 WBC 9.9 HGB 11.7 Platelets 209 AST 23 ALT 53  Alkphos 72 TBili 0.9 01/25/2022 INR 2.0  08/08/2022 Lactic acid 1.6 CT : 2 stones identified distal common bile duct which measured up to 5 mm WBC improved with IVF, not on antibiotics at this time, liver function stable with normal alkaline phosphatase and total bili  Paroxysmal atrial fibrillation and PAD status post right foot amputation Not on anticoagulation  Lower back pain No neuropathy, likely from mild  superior endplate deformity involving L1 vertebrae which is new, mild retropulsion fragments 3 mm, multilevel lumbar spondylolysis Per primary  CKD stage IIIb Improved with IVF  Normocytic anemia No signs of GI bleeding Likely from CKD versus anemia of chronic disease  Rheumatoid arthritis On methotrexate, presently on stress dose steroids  Type 2 diabetes Inpatient management  Principal Problem:   Choledocholithiasis Active Problems:   Diabetes mellitus (HCC)   PAD (peripheral artery disease) (HCC)   Paroxysmal atrial fibrillation (HCC)   Rheumatoid arthritis (HCC)   Stage 3b chronic kidney disease (CKD) (HCC)   DNR (do not resuscitate)   Elevated LFTs   Anemia    LOS: 1 day     Plan   - Daily CBC, CMET -Continue supportive care -Continue pain control. -Continue IV hydration, can do clear liquids today, n.p.o. at midnight. -No leukocytosis or signs of cholangitis, continue to monitor CBC. -Will discuss further with our biliary specialist about timing of ERCP, or if he feels need for MRCP. -I thoroughly discussed procedure with the patient to include nature, alternatives, benefits, and risks (including but not  limited to post ERCP pancreatitis, bleeding, infection, perforation, anesthesia/cardiac pulmonary complications). Discussed risk of pancreatitis associated with ERCP. Patient verbalized understanding and gave verbal consent to proceed with ERCP.   Thank you for your kind consultation, we will continue to follow.   Doree Albee  04/04/2023, 7:40 AM

## 2023-04-05 ENCOUNTER — Inpatient Hospital Stay (HOSPITAL_COMMUNITY): Payer: Medicare Other | Admitting: Anesthesiology

## 2023-04-05 ENCOUNTER — Encounter (HOSPITAL_COMMUNITY): Payer: Self-pay | Admitting: Internal Medicine

## 2023-04-05 ENCOUNTER — Inpatient Hospital Stay (HOSPITAL_COMMUNITY): Payer: Medicare Other

## 2023-04-05 ENCOUNTER — Encounter (HOSPITAL_COMMUNITY)
Admission: EM | Disposition: A | Discharge status: Home Health | Payer: Self-pay | Source: Home / Self Care | Attending: Internal Medicine

## 2023-04-05 DIAGNOSIS — Z9049 Acquired absence of other specified parts of digestive tract: Secondary | ICD-10-CM

## 2023-04-05 DIAGNOSIS — K8050 Calculus of bile duct without cholangitis or cholecystitis without obstruction: Secondary | ICD-10-CM

## 2023-04-05 DIAGNOSIS — K838 Other specified diseases of biliary tract: Secondary | ICD-10-CM | POA: Diagnosis not present

## 2023-04-05 DIAGNOSIS — Z980 Intestinal bypass and anastomosis status: Secondary | ICD-10-CM

## 2023-04-05 HISTORY — PX: ENDOSCOPIC RETROGRADE CHOLANGIOPANCREATOGRAPHY (ERCP) WITH PROPOFOL: SHX5810

## 2023-04-05 HISTORY — PX: SPHINCTEROTOMY: SHX5544

## 2023-04-05 HISTORY — PX: REMOVAL OF STONES: SHX5545

## 2023-04-05 LAB — CBC
HCT: 32.1 % — ABNORMAL LOW (ref 36.0–46.0)
Hemoglobin: 10.1 g/dL — ABNORMAL LOW (ref 12.0–15.0)
MCH: 30.5 pg (ref 26.0–34.0)
MCHC: 31.5 g/dL (ref 30.0–36.0)
MCV: 97 fL (ref 80.0–100.0)
Platelets: 177 10*3/uL (ref 150–400)
RBC: 3.31 MIL/uL — ABNORMAL LOW (ref 3.87–5.11)
RDW: 17.2 % — ABNORMAL HIGH (ref 11.5–15.5)
WBC: 10.8 10*3/uL — ABNORMAL HIGH (ref 4.0–10.5)
nRBC: 0 % (ref 0.0–0.2)

## 2023-04-05 LAB — COMPREHENSIVE METABOLIC PANEL
ALT: 75 U/L — ABNORMAL HIGH (ref 0–44)
AST: 39 U/L (ref 15–41)
Albumin: 2.8 g/dL — ABNORMAL LOW (ref 3.5–5.0)
Alkaline Phosphatase: 85 U/L (ref 38–126)
Anion gap: 10 (ref 5–15)
BUN: 46 mg/dL — ABNORMAL HIGH (ref 8–23)
CO2: 22 mmol/L (ref 22–32)
Calcium: 7.6 mg/dL — ABNORMAL LOW (ref 8.9–10.3)
Chloride: 106 mmol/L (ref 98–111)
Creatinine, Ser: 1.23 mg/dL — ABNORMAL HIGH (ref 0.44–1.00)
GFR, Estimated: 43 mL/min — ABNORMAL LOW (ref >60)
Glucose, Bld: 256 mg/dL — ABNORMAL HIGH (ref 70–99)
Potassium: 4.1 mmol/L (ref 3.5–5.1)
Sodium: 138 mmol/L (ref 135–145)
Total Bilirubin: 1.2 mg/dL (ref 0.0–1.2)
Total Protein: 5.2 g/dL — ABNORMAL LOW (ref 6.5–8.1)

## 2023-04-05 LAB — GLUCOSE, CAPILLARY
Glucose-Capillary: 263 mg/dL — ABNORMAL HIGH (ref 70–99)
Glucose-Capillary: 216 mg/dL — ABNORMAL HIGH (ref 70–99)
Glucose-Capillary: 313 mg/dL — ABNORMAL HIGH (ref 70–99)
Glucose-Capillary: 275 mg/dL — ABNORMAL HIGH (ref 70–99)
Glucose-Capillary: 253 mg/dL — ABNORMAL HIGH (ref 70–99)

## 2023-04-05 LAB — MAGNESIUM: Magnesium: 2.4 mg/dL (ref 1.7–2.4)

## 2023-04-05 LAB — PHOSPHORUS: Phosphorus: 4.1 mg/dL (ref 2.5–4.6)

## 2023-04-05 SURGERY — ENDOSCOPIC RETROGRADE CHOLANGIOPANCREATOGRAPHY (ERCP) WITH PROPOFOL
Anesthesia: General

## 2023-04-05 MED ORDER — LIDOCAINE 5 % EX PTCH
1.0000 | MEDICATED_PATCH | CUTANEOUS | Status: DC
  Administered 2023-04-05 – 2023-04-07 (×3): 1 via TRANSDERMAL
  Filled 2023-04-05 (×3): qty 1

## 2023-04-05 MED ORDER — INDOMETHACIN 50 MG RE SUPP
RECTAL | Status: AC
  Filled 2023-04-05: qty 2

## 2023-04-05 MED ORDER — FENTANYL CITRATE (PF) 250 MCG/5ML IJ SOLN
INTRAMUSCULAR | Status: DC | PRN
  Administered 2023-04-05 (×2): 50 ug via INTRAVENOUS

## 2023-04-05 MED ORDER — EPHEDRINE SULFATE-NACL 50-0.9 MG/10ML-% IV SOSY
PREFILLED_SYRINGE | INTRAVENOUS | Status: DC | PRN
  Administered 2023-04-05 (×2): 5 mg via INTRAVENOUS

## 2023-04-05 MED ORDER — LIDOCAINE 2% (20 MG/ML) 5 ML SYRINGE
INTRAMUSCULAR | Status: DC | PRN
  Administered 2023-04-05: 60 mg via INTRAVENOUS

## 2023-04-05 MED ORDER — SODIUM CHLORIDE 0.9 % IV SOLN
INTRAVENOUS | Status: DC | PRN
  Administered 2023-04-05: 30 mL

## 2023-04-05 MED ORDER — CIPROFLOXACIN IN D5W 400 MG/200ML IV SOLN
INTRAVENOUS | Status: AC
  Filled 2023-04-05: qty 200

## 2023-04-05 MED ORDER — PROPOFOL 1000 MG/100ML IV EMUL
INTRAVENOUS | Status: AC
  Filled 2023-04-05: qty 100

## 2023-04-05 MED ORDER — ONDANSETRON HCL 4 MG/2ML IJ SOLN
INTRAMUSCULAR | Status: DC | PRN
  Administered 2023-04-05: 4 mg via INTRAVENOUS

## 2023-04-05 MED ORDER — GLUCAGON HCL RDNA (DIAGNOSTIC) 1 MG IJ SOLR
INTRAMUSCULAR | Status: DC | PRN
  Administered 2023-04-05: .5 mg via INTRAVENOUS

## 2023-04-05 MED ORDER — AMLODIPINE BESYLATE 5 MG PO TABS
5.0000 mg | ORAL_TABLET | Freq: Every day | ORAL | Status: DC
Start: 2023-04-05 — End: 2023-04-07
  Administered 2023-04-05 – 2023-04-07 (×3): 5 mg via ORAL
  Filled 2023-04-05 (×3): qty 1

## 2023-04-05 MED ORDER — SUGAMMADEX SODIUM 200 MG/2ML IV SOLN
INTRAVENOUS | Status: DC | PRN
  Administered 2023-04-05: 200 mg via INTRAVENOUS

## 2023-04-05 MED ORDER — DEXAMETHASONE SODIUM PHOSPHATE 10 MG/ML IJ SOLN
INTRAMUSCULAR | Status: DC | PRN
  Administered 2023-04-05: 4 mg via INTRAVENOUS

## 2023-04-05 MED ORDER — FENTANYL CITRATE (PF) 100 MCG/2ML IJ SOLN
INTRAMUSCULAR | Status: AC
  Filled 2023-04-05: qty 2

## 2023-04-05 MED ORDER — ACETAMINOPHEN 325 MG PO TABS
650.0000 mg | ORAL_TABLET | Freq: Four times a day (QID) | ORAL | Status: DC | PRN
  Administered 2023-04-06 – 2023-04-07 (×4): 650 mg via ORAL
  Filled 2023-04-05 (×4): qty 2

## 2023-04-05 MED ORDER — PROPOFOL 10 MG/ML IV BOLUS
INTRAVENOUS | Status: DC | PRN
  Administered 2023-04-05: 150 ug/kg/min via INTRAVENOUS
  Administered 2023-04-05: 100 mg via INTRAVENOUS

## 2023-04-05 MED ORDER — CIPROFLOXACIN IN D5W 400 MG/200ML IV SOLN
INTRAVENOUS | Status: DC | PRN
  Administered 2023-04-05: 400 mg via INTRAVENOUS

## 2023-04-05 MED ORDER — INDOMETHACIN 50 MG RE SUPP
RECTAL | Status: DC | PRN
  Administered 2023-04-05: 100 mg via RECTAL

## 2023-04-05 MED ORDER — SODIUM CHLORIDE 0.9 % IV SOLN: INTRAVENOUS | Status: DC | PRN

## 2023-04-05 MED ORDER — ROCURONIUM BROMIDE 10 MG/ML (PF) SYRINGE
PREFILLED_SYRINGE | INTRAVENOUS | Status: DC | PRN
  Administered 2023-04-05: 50 mg via INTRAVENOUS

## 2023-04-05 MED ORDER — GLUCAGON HCL RDNA (DIAGNOSTIC) 1 MG IJ SOLR
INTRAMUSCULAR | Status: AC
  Filled 2023-04-05: qty 2

## 2023-04-05 MED ORDER — DICLOFENAC SUPPOSITORY 100 MG
RECTAL | Status: AC
  Filled 2023-04-05: qty 1

## 2023-04-05 MED ORDER — PHENYLEPHRINE 80 MCG/ML (10ML) SYRINGE FOR IV PUSH (FOR BLOOD PRESSURE SUPPORT)
PREFILLED_SYRINGE | INTRAVENOUS | Status: DC | PRN
  Administered 2023-04-05: 160 ug via INTRAVENOUS
  Administered 2023-04-05 (×2): 80 ug via INTRAVENOUS

## 2023-04-05 NOTE — Transfer of Care (Signed)
Immediate Anesthesia Transfer of Care Note  Patient: Shelby Walker  Procedure(s) Performed: ENDOSCOPIC RETROGRADE CHOLANGIOPANCREATOGRAPHY (ERCP) WITH PROPOFOL SPHINCTEROTOMY REMOVAL OF STONES  Patient Location: Endoscopy Unit  Anesthesia Type:General  Level of Consciousness: awake, oriented, and patient cooperative  Airway & Oxygen Therapy: Patient Spontanous Breathing and Patient connected to face mask oxygen  Post-op Assessment: Report given to RN and Post -op Vital signs reviewed and stable  Post vital signs: Reviewed  Last Vitals:  Vitals Value Taken Time  BP 167/54 04/05/23 1310  Temp 36.2 C 04/05/23 1305  Pulse 72 04/05/23 1312  Resp 11 04/05/23 1312  SpO2 98 % 04/05/23 1312  Vitals shown include unfiled device data.  Last Pain:  Vitals:   04/05/23 1305  TempSrc: Temporal  PainSc:       Patients Stated Pain Goal: 2 (04/04/23 2219)  Complications: No notable events documented.

## 2023-04-05 NOTE — Anesthesia Postprocedure Evaluation (Signed)
Anesthesia Post Note  Patient: VENESHA PETRAITIS  Procedure(s) Performed: ENDOSCOPIC RETROGRADE CHOLANGIOPANCREATOGRAPHY (ERCP) WITH PROPOFOL SPHINCTEROTOMY REMOVAL OF STONES     Patient location during evaluation: PACU Anesthesia Type: General Level of consciousness: awake and alert, oriented and patient cooperative Pain management: pain level controlled Vital Signs Assessment: post-procedure vital signs reviewed and stable Respiratory status: spontaneous breathing, nonlabored ventilation and respiratory function stable Cardiovascular status: blood pressure returned to baseline and stable Postop Assessment: no apparent nausea or vomiting Anesthetic complications: no   No notable events documented.  Last Vitals:  Vitals:   04/05/23 1335 04/05/23 1412  BP: (!) 179/47 (!) 161/56  Pulse: 70 70  Resp: 11   Temp:  (!) 36.3 C  SpO2: 95% 100%    Last Pain:  Vitals:   04/05/23 1330  TempSrc:   PainSc: Asleep                 Lannie Fields

## 2023-04-05 NOTE — Progress Notes (Signed)
   04/05/23 1412  Assess: MEWS Score  Temp (!) 97.4 F (36.3 C)  BP (!) 161/56  MAP (mmHg) 86  Pulse Rate 70  SpO2 100 %  O2 Device Room Air  Assess: MEWS Score  MEWS Temp 0  MEWS Systolic 0  MEWS Pulse 0  MEWS RR 1  MEWS LOC 1  MEWS Score 2  MEWS Score Color Yellow  Assess: if the MEWS score is Yellow or Red  Were vital signs accurate and taken at a resting state? Yes  Does the patient meet 2 or more of the SIRS criteria? Yes  Does the patient have a confirmed or suspected source of infection? No  MEWS guidelines implemented  Yes, yellow  Treat  MEWS Interventions Considered administering scheduled or prn medications/treatments as ordered  Take Vital Signs  Increase Vital Sign Frequency  Yellow: Q2hr x1, continue Q4hrs until patient remains green for 12hrs  Escalate  MEWS: Escalate Yellow: Discuss with charge nurse and consider notifying provider and/or RRT  Notify: Charge Nurse/RN  Name of Charge Nurse/RN Notified [facility]  Provider Notification  Provider Name/Title Gherghe costin  Date Provider Notified 04/05/23  Time Provider Notified 1412  Method of Notification Page  Notification Reason Change in status  Provider response No new orders  Date of Provider Response 04/05/23  Assess: SIRS CRITERIA  SIRS Temperature  0  SIRS Respirations  0  SIRS Pulse 0  SIRS WBC 0  SIRS Score Sum  0

## 2023-04-05 NOTE — Plan of Care (Signed)

## 2023-04-05 NOTE — Inpatient Diabetes Management (Signed)
Inpatient Diabetes Program Recommendations  AACE/ADA: New Consensus Statement on Inpatient Glycemic Control (2015)  Target Ranges:  Prepandial:   less than 140 mg/dL      Peak postprandial:   less than 180 mg/dL (1-2 hours)      Critically ill patients:  140 - 180 mg/dL   Lab Results  Component Value Date   GLUCAP 263 (H) 04/05/2023   HGBA1C 9.2 (H) 04/04/2023    Review of Glycemic Control  Diabetes history: DM2 Outpatient Diabetes medications: Tresiba 6 units at bedtime. Novolog 5-8 TID Current orders for Inpatient glycemic control: Novolog 0-9 TID  HgbA1C - 9.2%  Inpatient Diabetes Program Recommendations:    Consider Novolog 0-9 Q4H while NPO.  Follow while inpatient.  Thank you. Ailene Ards, RD, LDN, CDCES Inpatient Diabetes Coordinator [phone]

## 2023-04-05 NOTE — Progress Notes (Addendum)
Progress Note  Primary GI: Dr. Leone Payor DOA: 04/03/2023         Hospital Day: 3   Subjective  Chief Complaint: Choledocholithiasis   Patient with family at bedside, daughter and son. Provided some of the history.  Patient doing relatively okay over night still denies any nausea, vomiting, fever or chills.  She is complaining more of right flank and right upper abdominal pain rather than back pain.  No shortness of breath or chest pain    Objective   Vital signs in last 24 hours: Temp:  [97.8 F (36.6 C)-98.9 F (37.2 C)] 98.4 F (36.9 C) (01/31 0316) Pulse Rate:  [60-73] 60 (01/31 0316) Resp:  [13-18] 13 (01/31 0316) BP: (104-148)/(44-79) 137/58 (01/31 0316) SpO2:  [95 %-99 %] 98 % (01/31 0316) Last BM Date : 04/03/23 Last BM recorded by nurses in past 5 days No data recorded  General:   Elderly female in no acute distress Heart:  regular rate and rhythm, systolic murmur Pulm: Clear anteriorly; no wheezing Abdomen: Soft abdomen, epigastric tenderness mild no rebound Extremities: Bilateral stasis dermatitis, right foot amputation, ulnar deviation of bilateral hands, full ROM. Msk:  Symmetrical without gross deformities. Peripheral pulses intact.  Neurologic:  Alert and  oriented x4;  No focal deficits.  Skin:   Dry and intact without significant lesions or rashes. Psychiatric:  Cooperative. Normal mood and affect.  Intake/Output from previous day: 01/30 0701 - 01/31 0700 In: 151.4 [I.V.:151.4] Out: -  Intake/Output this shift: No intake/output data recorded.  Studies/Results: CT ABDOMEN PELVIS W CONTRAST Addendum Date: 04/03/2023 ADDENDUM REPORT: 04/03/2023 17:11 ADDENDUM: These results were called by telephone at the time of interpretation on 04/03/2023 at 5:11 pm to provider Dour Clelia Croft, who verbally acknowledged these results. Electronically Signed   By: Signa Kell M.D.   On: 04/03/2023 17:11   Result Date: 04/03/2023 CLINICAL DATA:  History of pancreatitis.  Status post cholecystectomy. Complains of severe lower abdominal pain and distension. EXAM: CT ABDOMEN AND PELVIS WITH CONTRAST TECHNIQUE: Multidetector CT imaging of the abdomen and pelvis was performed using the standard protocol following bolus administration of intravenous contrast. RADIATION DOSE REDUCTION: This exam was performed according to the departmental dose-optimization program which includes automated exposure control, adjustment of the mA and/or kV according to patient size and/or use of iterative reconstruction technique. CONTRAST:  75mL ISOVUE-300 IOPAMIDOL (ISOVUE-300) INJECTION 61% COMPARISON:  MRI/MRCP from 08/10/2022.  CT AP from 08/07/2022. FINDINGS: Lower chest: Scarring noted within the lung bases. No pleural or pericardial effusion. Hepatobiliary: No focal liver abnormality. Status post cholecystectomy. The common bile duct measures 8 mm in diameter which is unchanged compared with the previous exam. No significant intrahepatic bile duct dilatation. Within the distal common bile duct there are 2 gallstones identified which measure up to 5 mm, image 39/4. Pancreas: Unremarkable. No pancreatic ductal dilatation or surrounding inflammatory changes. Spleen: Normal in size without focal abnormality. Adrenals/Urinary Tract: Normal adrenal glands. Bilateral Bosniak class 1 cysts are identified. The largest arises off the lateral cortex of the right kidney measuring 2.8 cm, image 22/2. No follow-up imaging recommended. Arising off the upper pole of the right kidney is a exophytic cyst which measures 41 Hounsfield units and 1.3 cm. On the previous MRI this was uniformly T1 hyperintense and T2 hypointense compatible with a benign Bosniak class 2 cyst. No follow-up imaging recommended. No nephrolithiasis or obstructive uropathy. Urinary bladder appears normal. Stomach/Bowel: Stomach is normal. No pathologic dilatation of the large or small bowel  loops to suggest a bowel obstruction. Fecalized distal  small bowel loops are noted. No small bowel wall thickening, inflammation or distension. Mild to moderate stool burden noted throughout the colon. Severe sigmoid diverticulosis. No signs of acute diverticulitis. The appendix is visualized and appears normal. Vascular/Lymphatic: Aortic atherosclerosis without aneurysm. The upper abdominal vascularity appears patent. Reproductive: Uterus and bilateral adnexa are unremarkable. Other: No free fluid or fluid collections. No signs of pneumoperitoneum. Musculoskeletal: Multilevel lumbar spondylosis identified. Anterolisthesis of L4 on L5 measures 5 mm. Mild superior endplate deformity involving the L1 vertebra is identified, new compared with the exam from 08/07/2022. There is mild retropulsion of the fracture fragments by approximately 3 mm. IMPRESSION: 1. Signs of choledocholithiasis status post cholecystectomy. There are 2 stones identified within the distal common bile duct which measure up to 5 mm. 2. Severe sigmoid diverticulosis without signs of acute diverticulitis. 3. Mild superior endplate deformity involving the L1 vertebra is identified, new compared with the exam from 08/07/2022. There is mild retropulsion of the fracture fragments by approximately 3 mm. 4.  Aortic Atherosclerosis (ICD10-I70.0). Electronically Signed: By: Signa Kell M.D. On: 04/03/2023 17:07    Lab Results: Recent Labs    04/03/23 1823 04/04/23 0414 04/05/23 0520  WBC 11.4* 9.9 10.8*  HGB 11.3* 11.7* 10.1*  HCT 36.1 37.8 32.1*  PLT 227 209 177   BMET Recent Labs    04/03/23 1823 04/04/23 0414 04/05/23 0520  NA 137 142 138  K 5.7* 4.5 4.1  CL 102 107 106  CO2 26 26 22   GLUCOSE 310* 78 256*  BUN 63* 55* 46*  CREATININE 1.51* 1.34* 1.23*  CALCIUM 8.6* 8.7* 7.6*   LFT Recent Labs    04/04/23 0414 04/05/23 0520  PROT 6.2* 5.2*  ALBUMIN 3.4* 2.8*  AST 23 39  ALT 53* 75*  ALKPHOS 72 85  BILITOT 0.9 1.2  BILIDIR 0.1  --   IBILI 0.8  --    PT/INR No  results for input(s): "LABPROT", "INR" in the last 72 hours.   Scheduled Meds:  amiodarone  100 mg Oral Daily   hydrocortisone sod succinate (SOLU-CORTEF) inj  50 mg Intravenous Q8H   indomethacin  100 mg Rectal Once   insulin aspart  0-9 Units Subcutaneous TID WC   lidocaine  1 patch Transdermal Q24H   Continuous Infusions:  sodium chloride 20 mL/hr at 04/05/23 0316      Patient profile:    89 y.o. female with past medical history significant for type 2 diabetes, paroxysmal atrial fibrillation history of 25-second pause followed by pacemaker later removed secondary to sepsis with MSSA bacteremia, hyperlipidemia, GERD, CKD stage III, RA, anemia, history of recurrent C. difficile, gallstone pancreatitis.    Impression/Plan:   Choledocholithiasis status post cholecystectomy 08/09/2022 WBC 10.8  Platelets 177 (WBC 9.9 HGB 11.7 Platelets 209) AST 39 ALT 75  Alkphos 85 TBili 1.2 (AST 23 ALT 53  Alkphos 72 TBili 0.9) 01/25/2022 INR 2.0  08/08/2022 Lactic acid 1.6 CT : 2 stones identified distal common bile duct which measured up to 5 mm WBC improved with IVF, received 1 dose of Cipro liver function stable with normal alkaline phosphatase and total bili - stable for ERCP today - currently NPO, diet per Dr. Marina Goodell after procedure --I thoroughly discussed procedure with the patient to include nature, alternatives, benefits, and risks (including but not limited to post ERCP pancreatitis, bleeding, infection, perforation, anesthesia/cardiac pulmonary complications). Discussed risk of pancreatitis associated with ERCP. Patient verbalized understanding and gave  verbal consent to proceed with ERCP.    Paroxysmal atrial fibrillation and PAD status post right foot amputation Not on anticoagulation   Lower back pain No neuropathy, likely from mild superior endplate deformity involving L1 vertebrae which is new, mild retropulsion fragments 3 mm, multilevel lumbar spondylolysis Per primary   CKD stage  IIIb Improved with IVF   Normocytic anemia HGB 10.1 No signs of GI bleeding Likely from CKD versus anemia of chronic disease   Rheumatoid arthritis On methotrexate   Type 2 diabetes Inpatient management  Principal Problem:   Choledocholithiasis Active Problems:   Diabetes mellitus (HCC)   PAD (peripheral artery disease) (HCC)   Paroxysmal atrial fibrillation (HCC)   Rheumatoid arthritis (HCC)   Stage 3b chronic kidney disease (CKD) (HCC)   DNR (do not resuscitate)   Elevated LFTs   Anemia    LOS: 2 days   Doree Albee  04/05/2023, 9:46 AM  GI ATTENDING  Interval history data reviewed.  Patient personally seen and examined in the endoscopy admissions area preprocedure.  She is feeling well this morning.  Abdomen benign.  Now for ERCP with sphincterotomy and common duct stone extraction.The nature of the procedure, as well as the risks, benefits, and alternatives were carefully and thoroughly reviewed with the patient. Ample time for discussion and questions allowed. The patient understood, was satisfied, and agreed to proceed.  Wilhemina Bonito. Eda Keys., M.D. [facility] Division of Gastroenterology

## 2023-04-05 NOTE — Anesthesia Preprocedure Evaluation (Addendum)
Anesthesia Evaluation  Patient identified by MRN, date of birth, ID band Patient awake    Reviewed: Allergy & Precautions, NPO status , Patient's Chart, lab work & pertinent test results  History of Anesthesia Complications (+) PONV and history of anesthetic complications (pt denies)  Airway Mallampati: III  TM Distance: >3 FB Neck ROM: Full    Dental  (+) Edentulous Lower, Edentulous Upper, Dental Advisory Given   Pulmonary neg pulmonary ROS   Pulmonary exam normal breath sounds clear to auscultation       Cardiovascular hypertension (133/48 preop), Pt. on medications + Peripheral Vascular Disease  Normal cardiovascular exam+ Valvular Problems/Murmurs (mild MR) MR  Rhythm:Regular Rate:Normal  Echo 2023 1. Left ventricular ejection fraction, by estimation, is 50 to 55%. The  left ventricle has low normal function.   2. Right ventricular systolic function is normal. The right ventricular  size is normal. There is normal pulmonary artery systolic pressure.   3. Left atrial size was moderately dilated. No left atrial/left atrial  appendage thrombus was detected.   4. The mitral valve is degenerative. Mild mitral valve regurgitation. No  evidence of mitral stenosis.   5. The aortic valve is tricuspid. There is mild calcification of the  aortic valve. There is mild thickening of the aortic valve. Aortic valve  regurgitation is not visualized. Aortic valve sclerosis is present, with  no evidence of aortic valve stenosis.   6. There is mild (Grade II) layered plaque involving the descending aorta  and aortic arch.     Neuro/Psych negative neurological ROS  negative psych ROS   GI/Hepatic Neg liver ROS,GERD  Controlled and Medicated,,Choledocholithiasis    Endo/Other  diabetes, Well Controlled, Type 2, Insulin Dependent    Renal/GU negative Renal ROS  negative genitourinary   Musculoskeletal  (+) Arthritis ,  Osteoarthritis and Rheumatoid disorders,    Abdominal   Peds  Hematology  (+) Blood dyscrasia, anemia Hb 10.1, plt 177   Anesthesia Other Findings   Reproductive/Obstetrics negative OB ROS                              Anesthesia Physical Anesthesia Plan  ASA: 3  Anesthesia Plan: General   Post-op Pain Management:    Induction: Intravenous  PONV Risk Score and Plan: Ondansetron, Dexamethasone and Treatment may vary due to age or medical condition  Airway Management Planned: Oral ETT  Additional Equipment: None  Intra-op Plan:   Post-operative Plan: Extubation in OR  Informed Consent: I have reviewed the patients History and Physical, chart, labs and discussed the procedure including the risks, benefits and alternatives for the proposed anesthesia with the patient or authorized representative who has indicated his/her understanding and acceptance.     Dental advisory given  Plan Discussed with: CRNA  Anesthesia Plan Comments:         Anesthesia Quick Evaluation

## 2023-04-05 NOTE — Evaluation (Signed)
Physical Therapy Evaluation Patient Details Name: Shelby Walker MRN: [id number] DOB: December 13, 1933 Today's Date: 04/05/2023  History of Present Illness  Pt is 89 yo female who presented on 04/03/23 with abdominal and back pain for past week.  She was found to have choledocholithiasis and mild L1 endplate deformity to be managed conservatively. Pt with hx including but not limited to DM2, PAF, HLD, CKD, RA, acute biliary pancreatitis 6/24, s/p postcholecystectomy  Clinical Impression  Pt admitted with above diagnosis. At baseline, pt is ambulatory with RW and independent with ADLs.  Family assist with IADLs, live nearby, and available if needed.  Today, pt presents with back/abdomen pain but was premedicated and reports tolerable.  Back pain likely from choledocholithiasis and L1 fx.  Educated on back precautions and provided handout.  Pt able to ambulate and transfer at supervision/CGA level.  Expected to progress well with therapy. Do recommend HHPT at d/c.  Pt currently with functional limitations due to the deficits listed below (see PT Problem List). Pt will benefit from acute skilled PT to increase their independence and safety with mobility to allow discharge.           If plan is discharge home, recommend the following: A little help with walking and/or transfers;A little help with bathing/dressing/bathroom;Assistance with cooking/housework;Help with stairs or ramp for entrance   Can travel by private vehicle        Equipment Recommendations None recommended by PT  Recommendations for Other Services       Functional Status Assessment Patient has had a recent decline in their functional status and demonstrates the ability to make significant improvements in function in a reasonable and predictable amount of time.     Precautions / Restrictions Precautions Precautions: Fall;Back Precaution Booklet Issued: Yes (comment) Precaution Comments: Back for comfort - provided handout; no brace  need      Mobility  Bed Mobility Overal bed mobility: Needs Assistance Bed Mobility: Rolling, Sidelying to Sit, Sit to Sidelying Rolling: Contact guard assist Sidelying to sit: Contact guard assist     Sit to sidelying: Contact guard assist General bed mobility comments: Education log roll    Transfers Overall transfer level: Needs assistance Equipment used: Rolling walker (2 wheels) Transfers: Sit to/from Stand Sit to Stand: Contact guard assist           General transfer comment: From bed and toilet.  Doing ADLs with supervision.    Ambulation/Gait Ambulation/Gait assistance: Contact guard assist Gait Distance (Feet): 120 Feet Assistive device: Rolling walker (2 wheels) Gait Pattern/deviations: Step-to pattern, Decreased stride length Gait velocity: decreased but functional     General Gait Details: cues for RW proximity  Stairs            Wheelchair Mobility     Tilt Bed    Modified Rankin (Stroke Patients Only)       Balance Overall balance assessment: Needs assistance Sitting-balance support: No upper extremity supported Sitting balance-Leahy Scale: Good     Standing balance support: No upper extremity supported, Bilateral upper extremity supported Standing balance-Leahy Scale: Fair Standing balance comment: RW to ambulate but able to stand without UE support                             Pertinent Vitals/Pain Pain Assessment Pain Assessment: 0-10 Pain Score: 4  Pain Location: Back with radiation to R flank Pain Descriptors / Indicators: Discomfort Pain Intervention(s): Limited activity within patient's tolerance, Premedicated  before session, Monitored during session    Home Living Family/patient expects to be discharged to:: Private residence Living Arrangements: Alone Available Help at Discharge: Family;Available PRN/intermittently;Personal care attendant (Aide periodically) Type of Home: House Home Access: Stairs to  enter Entrance Stairs-Rails: Left Entrance Stairs-Number of Steps: 2 Alternate Level Stairs-Number of Steps: chair lift to basement Home Layout: One level;Laundry or work area in basement;Multi-level (aide does her laundry) Home Equipment: Agricultural consultant (2 wheels);Shower seat;Grab bars - tub/shower;Hand held Stage manager (4 wheels);BSC/3in1;Other (comment);Lift chair Additional Comments: family lives a few houses down and are available to assist if needed. Pt has stair lift to the basement if she needs to access it    Prior Function Prior Level of Function : Needs assist             Mobility Comments: Ambulates with RW; mainly short community ambulation; denies falls ADLs Comments: Pt independent with ADLs; Family assist with IADLs/meals; Could fix easy meals; family assist with transportation     Extremity/Trunk Assessment   Upper Extremity Assessment Upper Extremity Assessment: LUE deficits/detail LUE Deficits / Details: Pt reports IV infiltrated in ED, some UE edema, difficult to lift.  Encouraged ROM and elevation; pt does have severe RA (evident in hands bil)    Lower Extremity Assessment Lower Extremity Assessment: LLE deficits/detail;RLE deficits/detail RLE Deficits / Details: ROM WFL; MMT 5/5 LLE Deficits / Details: ROM WFL; MMT 5/5    Cervical / Trunk Assessment Cervical / Trunk Assessment: Kyphotic;Other exceptions Cervical / Trunk Exceptions: Mild L1 fx - manage conservatively per note  Communication      Cognition Arousal: Alert Behavior During Therapy: WFL for tasks assessed/performed Overall Cognitive Status: Within Functional Limits for tasks assessed                                          General Comments General comments (skin integrity, edema, etc.): VSS    Exercises     Assessment/Plan    PT Assessment Patient needs continued PT services  PT Problem List Decreased strength;Pain;Decreased range of motion;Decreased  activity tolerance;Decreased balance;Decreased mobility;Decreased knowledge of precautions;Decreased knowledge of use of DME       PT Treatment Interventions DME instruction;Therapeutic exercise;Gait training;Stair training;Functional mobility training;Therapeutic activities;Patient/family education;Modalities;Balance training;Neuromuscular re-education    PT Goals (Current goals can be found in the Care Plan section)  Acute Rehab PT Goals Patient Stated Goal: return home PT Goal Formulation: With patient Time For Goal Achievement: 04/19/23 Potential to Achieve Goals: Good    Frequency Min 1X/week     Co-evaluation               AM-PAC PT "6 Clicks" Mobility  Outcome Measure Help needed turning from your back to your side while in a flat bed without using bedrails?: A Little Help needed moving from lying on your back to sitting on the side of a flat bed without using bedrails?: A Little Help needed moving to and from a bed to a chair (including a wheelchair)?: A Little Help needed standing up from a chair using your arms (e.g., wheelchair or bedside chair)?: A Little Help needed to walk in hospital room?: A Little Help needed climbing 3-5 steps with a railing? : A Little 6 Click Score: 18    End of Session Equipment Utilized During Treatment: Gait belt Activity Tolerance: Patient tolerated treatment well Patient left: in bed;with call bell/phone  within reach;with bed alarm set;with family/visitor present Nurse Communication: Mobility status PT Visit Diagnosis: Other abnormalities of gait and mobility (R26.89);Muscle weakness (generalized) (M62.81);Pain Pain - part of body:  (back)    Time: 1025-1055 PT Time Calculation (min) (ACUTE ONLY): 30 min   Charges:   PT Evaluation $PT Eval Low Complexity: 1 Low PT Treatments $Therapeutic Activity: 8-22 mins PT General Charges $$ ACUTE PT VISIT: 1 Visit         Anise Salvo, PT Acute Rehab [facility] Re[facility]  [phone]   Rayetta Humphrey 04/05/2023, 11:10 AM

## 2023-04-05 NOTE — Op Note (Signed)
[facility] Patient Name: Shelby Walker Procedure Date: 04/05/2023 MRN: [id number] Attending MD: Wilhemina Bonito. Marina Goodell , MD, [id number] Date of Birth: [birth year] CSN: [id number] Age: 89 Admit Type: Inpatient Procedure:                ERCP with sphincterotomy and common duct stone                            extraction Indications:              Abdominal pain in the right upper quadrant, Bile                            duct stone on Computed Tomogram Scan, Abnormal                            liver function test Providers:                Wilhemina Bonito. Marina Goodell, MD, Fransisca Connors, Salley Scarlet, Technician, Romie Minus, CRNA Referring MD:             [facility] Medicines:                General Anesthesia Complications:            No immediate complications. Estimated Blood Loss:     Estimated blood loss was minimal. Procedure:                Pre-Anesthesia Assessment:                           - Prior to the procedure, a History and Physical                            was performed, and patient medications and                            allergies were reviewed. The patient is competent.                            The risks and benefits of the procedure and the                            sedation options and risks were discussed with the                            patient. All questions were answered and informed                            consent was obtained. Patient identification and                            proposed procedure were verified by the physician.  Mental Status Examination: alert and oriented.                            Airway Examination: normal oropharyngeal airway and                            neck mobility. Respiratory Examination: clear to                            auscultation. CV Examination: normal. Prophylactic                            Antibiotics: The patient does not require                             prophylactic antibiotics. Prior Anticoagulants: The                            patient has taken no anticoagulant or antiplatelet                            agents. ASA Grade Assessment: III - A patient with                            severe systemic disease. After reviewing the risks                            and benefits, the patient was deemed in                            satisfactory condition to undergo the procedure.                            The anesthesia plan was to use moderate sedation /                            analgesia (conscious sedation). Immediately prior                            to administration of medications, the patient was                            re-assessed for adequacy to receive sedatives. The                            heart rate, respiratory rate, oxygen saturations,                            blood pressure, adequacy of pulmonary ventilation,                            and response to care were monitored throughout the  procedure. The physical status of the patient was                            re-assessed after the procedure.                           After obtaining informed consent, the scope was                            passed under direct vision. Throughout the                            procedure, the patient's blood pressure, pulse, and                            oxygen saturations were monitored continuously. IV                            ciprofloxacin and per rectum indomethacin were                            provided to the patient preprocedure. The [id number]-Q190V                            ([id number]) Olympus duodenoscope was introduced                            through the mouth, and used to inject contrast into                            and used to inject contrast into the bile duct. The                            ERCP was accomplished without difficulty. The                            patient tolerated the  procedure well. Scope In: Scope Out: Findings:      1. The side-viewing endoscope was passed blindly into the esophagus. The       stomach and duodenum were grossly normal. The major ampulla was normal.       The minor ampulla was not sought.      2. A scout radiograph of the abdomen with the endoscope and position       revealed cholecystectomy clips.      3. On the first attempt, the bile duct was selectively and deeply       cannulated with the catheter. Injection of contrast thereafter yielded       moderate dilation of the extrahepatic biliary tree with a 5 to 6 mm       filling defect in the midportion. There was evidence of prior       cholecystectomy. The intrahepatic radicles were a bit ectatic.      4. Over a hydrophilic guidewire, a biliary sphincterotomy was made with       cutting in the 12:00 orientation using the ERBE system. The  sphincterotome was exchanged for a sequential balloon (12-15 mm). The       balloon was swept the bile duct multiple times. Filling defects       consistent with cholesterol stones were extracted. Postextraction       occlusion cholangiogram was negative for residual filling defects.       Excellent drainage noted.      5. There was no injection or manipulation of the pancreatic duct Impression:               1. Choledocholithiasis status post ERC with                            sphincterotomy and common duct stone extraction                           2. Status post cholecystectomy. Moderate Sedation:      none Recommendation:           1. Observe patient's clinical course with standard                            post ERCP care.                           2. Advance diet as tolerated                           3. If doing well overnight, may be able to go home                            in the morning.                           Discussed with the patient's son and daughter                            [name]) by telephone. Procedure Code(s):         --- Professional ---                           [phone], Endoscopic retrograde                            cholangiopancreatography (ERCP); with removal of                            calculi/debris from biliary/pancreatic duct(s)                           43262, Endoscopic retrograde                            cholangiopancreatography (ERCP); with                            sphincterotomy/papillotomy Diagnosis Code(s):        --- Professional ---  Z90.49, Acquired absence of other specified parts                            of digestive tract                           K80.50, Calculus of bile duct without cholangitis                            or cholecystitis without obstruction                           R10.11, Right upper quadrant pain                           R79.89, Other specified abnormal findings of blood                            chemistry                           K83.8, Other specified diseases of biliary tract CPT copyright 2022 American Medical Association. All rights reserved. The codes documented in this report are preliminary and upon coder review may  be revised to meet current compliance requirements. Wilhemina Bonito. Marina Goodell, MD 04/05/2023 1:13:19 PM This report has been signed electronically. Number of Addenda: 0

## 2023-04-05 NOTE — TOC Initial Note (Signed)
Transition of Care [facility]) - Initial/Assessment Note    Patient Details  Name: Shelby Walker MRN: [id number] Date of Birth: 1933-11-11  [facility] of [facility]) CM/SW Contact:    Otelia Santee, LCSW Phone Number: 04/05/2023, 2:31 PM  Clinical Narrative:                 Pt from home alone. Pt active with [facility] for home health nursing. Pt and family agreeable to having PT added to pt's [facility] services. Spoke with Haywood Lasso and confirmed [facility] able to provide HHPT/RN. HH orders will need to be placed prior to discharge. No DME needs identified.   Expected Discharge Plan: Home w Home Health Services Barriers to Discharge: No Barriers Identified   Patient Goals and CMS Choice Patient states their goals for this hospitalization and ongoing recovery are:: To return home          Expected Discharge Plan and Services In-house Referral: Clinical Social Work Discharge Planning Services: NA Post Acute Care Choice: Home Health, Resumption of Svcs/PTA Provider Living arrangements for the past 2 months: Single Family Home                 DME Arranged: N/A DME Agency: NA       HH Arranged: PT, RN HH Agency: [facility] Date HH Agency Contacted: 04/05/23 Time HH Agency Contacted: 1431 Representative spoke with at [facility] Agency: Haywood Lasso  Prior Living Arrangements/Services Living arrangements for the past 2 months: Single Family Home Lives with:: Self Patient language and need for interpreter reviewed:: Yes Do you feel safe going back to the place where you live?: Yes      Need for Family Participation in Patient Care: Yes (Comment) Care giver support system in place?: Yes (comment) Current home services: DME, Home RN (has walker,rollator,shower bench) Criminal Activity/Legal Involvement Pertinent to Current Situation/Hospitalization: No - Comment as needed  Activities of Daily Living   ADL Screening (condition at time of admission) Independently performs ADLs?: Yes (appropriate  for developmental age) Is the patient deaf or have difficulty hearing?: No Does the patient have difficulty seeing, even when wearing glasses/contacts?: No Does the patient have difficulty concentrating, remembering, or making decisions?: No  Permission Sought/Granted Permission sought to share information with : Facility Medical sales representative, Family Supports Permission granted to share information with : Yes, Verbal Permission Granted     Permission granted to share info w AGENCY: [facility]        Emotional Assessment Appearance:: Appears stated age   Affect (typically observed): Accepting Orientation: : Oriented to Self, Oriented to  Time, Oriented to Place, Oriented to Situation Alcohol / Substance Use: Not Applicable Psych Involvement: No (comment)  Admission diagnosis:  Choledocholithiasis [K80.50] Patient Active Problem List   Diagnosis Date Noted   Elevated LFTs 04/04/2023   Anemia 04/04/2023   Choledocholithiasis 04/03/2023   Acute biliary pancreatitis 08/07/2022   Sepsis (HCC) 08/07/2022   Sacral wound 05/14/2022   Coronary artery disease involving native coronary artery of native heart without angina pectoris 02/21/2022   Discitis of lumbar region 02/05/2022   Herpes zoster without complication 02/05/2022   Clostridium difficile colitis 02/05/2022   Severe sepsis with acute organ dysfunction due to Gram positive bacteria (HCC) 02/02/2022   Pacemaker infection (HCC) 02/01/2022   Periostitis of lumbar spine without osteomyelitis (HCC) 02/01/2022   Acute midline low back pain without sciatica 01/26/2022   MSSA bacteremia 01/25/2022   Presence of cardiac pacemaker 01/25/2022   Severe sepsis with septic shock (  HCC) 01/24/2022   AKI (acute kidney injury) (HCC) 01/24/2022   Sinus pause 01/04/2022   Acute viral bronchitis 02/06/2021   Acute blood loss anemia    Postoperative pain    S/P transmetatarsal amputation of foot, right (HCC) 01/24/2021   Protein-calorie  malnutrition, severe 01/17/2021   Diabetic foot infection (HCC) 01/16/2021   Rheumatoid arthritis (HCC) 01/16/2021   Stage 3b chronic kidney disease (CKD) (HCC) 01/16/2021   DNR (do not resuscitate) 01/16/2021   Decreased anal sphincter tone 12/13/2018   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   Irritable bowel syndrome with diarrhea - post infectious 12/12/2018   Full incontinence of feces and fecal smearing 12/12/2018   Perianal dermatitis 12/12/2018   Uncontrolled type 2 diabetes mellitus with hyperglycemia (HCC) 10/26/2018   Long-term use of immunosuppressant medication-Enbrel 10/26/2018   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 08/27/2018   Thrombocytopenia (HCC)    Essential hypertension    Dyspnea on exertion    Steroid-induced hyperglycemia    Fatigue 08/06/2018   Paroxysmal atrial fibrillation (HCC)    Mixed hyperlipidemia 09/29/2016   Stable angina (HCC) 09/29/2016   PAD (peripheral artery disease) (HCC) 08/09/2016   Preoperative clearance 05/21/2012   Precordial chest pain    Palpitations    Diabetes mellitus (HCC)    GERD (gastroesophageal reflux disease)    Ejection fraction    Carotid artery disease (HCC)    Mitral regurgitation 04/21/2009   PSORIASIS 02/09/2009   Rheumatoid arthritis flare (HCC) 02/09/2009   PCP:  Adrian Prince, MD Pharmacy:   [facility] - [place], [place] - [address] [place] [address] Phone: [phone] Fax: [phone]  [facility] [address], [place] - [address] [place] [address] Phone: [phone] Fax: [phone]     Social Drivers of Health (SDOH) Social History: SDOH Screenings   Food Insecurity: No Food Insecurity (04/04/2023)  Housing: Low Risk  (04/04/2023)  Transportation Needs: No Transportation Needs (04/04/2023)  Utilities: Not At Risk (04/04/2023)  Depression (PHQ2-9): Low Risk  (05/28/2022)  Financial Resource  Strain: Low Risk  (07/24/2018)  Physical Activity: Sufficiently Active (07/24/2018)  Social Connections: Socially Integrated (04/04/2023)  Stress: No Stress Concern Present (07/24/2018)  Tobacco Use: Low Risk  (04/05/2023)   SDOH Interventions:     Readmission Risk Interventions    04/05/2023    2:29 PM 02/02/2022    1:25 PM  Readmission Risk Prevention Plan  Transportation Screening Complete   PCP or Specialist Appt within 5-7 Days Complete   Home Care Screening Complete   Medication Review (RN CM) Complete   HRI or Home Care Consult  Complete  Social Work Consult for Recovery Care Planning/Counseling  Complete  Palliative Care Screening  Not Applicable  Medication Review Oceanographer)  Complete

## 2023-04-05 NOTE — Anesthesia Procedure Notes (Signed)
Procedure Name: Intubation Date/Time: 04/05/2023 12:06 PM  Performed by: Lovie Chol, CRNAPre-anesthesia Checklist: Patient identified, Emergency Drugs available, Suction available and Patient being monitored Patient Re-evaluated:Patient Re-evaluated prior to induction Oxygen Delivery Method: Circle System Utilized Preoxygenation: Pre-oxygenation with 100% oxygen Induction Type: IV induction Ventilation: Mask ventilation without difficulty Laryngoscope Size: Miller and 3 Grade View: Grade I Tube type: Oral Tube size: 7.0 mm Number of attempts: 1 Airway Equipment and Method: Stylet Placement Confirmation: ETT inserted through vocal cords under direct vision, positive ETCO2 and breath sounds checked- equal and bilateral Secured at: 21 cm Tube secured with: Tape Dental Injury: Teeth and Oropharynx as per pre-operative assessment

## 2023-04-05 NOTE — Progress Notes (Signed)
PROGRESS NOTE  Shelby Walker ZOX:[id number] DOB: [birth year] DOA: 04/03/2023 PCP: Adrian Prince, MD   LOS: 2 days   Brief Narrative / Interim history: 89 year old female with DM2, PAF, HLD, CKD 3, RA who was admitted in June 2024 for acute biliary pancreatitis, status postcholecystectomy, comes into the hospital for abdominal and back pain for the past week.  Patient is not the best historian but tells me that she has been having lower back pain, sometimes wrapping around the sides on the left as well as the right side, sometimes the pain is in the right upper quadrant also.  No nausea or vomiting, no fever or chills.  She was treated conservatively as an outpatient but due to lack of improvement underwent a CT scan which showed cholelithiasis, and she was directed to the ER  Subjective / 24h Interval events: Complains of lower back pain, no abdominal discomfort this morning, no nausea or vomiting.  Assesement and Plan: Principal Problem:   Choledocholithiasis Active Problems:   Diabetes mellitus (HCC)   PAD (peripheral artery disease) (HCC)   Paroxysmal atrial fibrillation (HCC)   Rheumatoid arthritis (HCC)   Stage 3b chronic kidney disease (CKD) (HCC)   DNR (do not resuscitate)   Elevated LFTs   Anemia  Principal problem Choledocholithiasis-white count is normal, LFTs are normal, there is no signs of cholangitis.  Gastroenterology consulted, plan for ER CP today, appreciate input -Will closely monitor post-procedure  Active problems Mild superior endplate deformity of the L1 vertebra -this appears to be new, could potentially explain some of her back pain.  Conservative management, PT consult -Lidocaine patch today  PAF-continue amiodarone, apparently not on anticoagulation due to fall risk.  In sinus rhythm  CKD 3B-baseline creatinine 1.3-1.7, creatinine remains at baseline this morning  Anemia of chronic disease-hemoglobin stable, no bleeding  Chronic wound of the left lower  extremity-outpatient follow-up  DM 2-continue sliding scale  Scheduled Meds:  amiodarone  100 mg Oral Daily   hydrocortisone sod succinate (SOLU-CORTEF) inj  50 mg Intravenous Q8H   indomethacin  100 mg Rectal Once   insulin aspart  0-9 Units Subcutaneous TID WC   lidocaine  1 patch Transdermal Q24H   Continuous Infusions:  sodium chloride 20 mL/hr at 04/05/23 0316   PRN Meds:.fentaNYL (SUBLIMAZE) injection, ondansetron (ZOFRAN) IV  Current Outpatient Medications  Medication Instructions   acetaminophen (TYLENOL) 650 mg, Oral, Every 6 hours PRN   acetaminophen-codeine (TYLENOL #3) 300-30 MG tablet 1 tablet, Oral, Every 4 hours PRN   amiodarone (PACERONE) 100 mg, Oral, Daily   atorvastatin (LIPITOR) 20 mg, Oral, Daily   Calcium Carbonate-Vitamin D 600-400 MG-UNIT tablet 1 tablet, Oral, Daily,     diclofenac Sodium (VOLTAREN) 4 g, Topical, 4 times daily   esomeprazole (NEXIUM) 20 mg, Oral, Daily   folic acid (FOLVITE) 1 mg, Oral, Daily   insulin aspart (NOVOLOG) 2 Units, Subcutaneous, 3 times daily with meals, Sliding scale   Insulin Degludec (TRESIBA) 4 Units, Subcutaneous, Daily at bedtime   iron polysaccharides (NIFEREX) 150 mg, Oral, Daily   methocarbamol (ROBAXIN) 500 mg, Oral, As needed   methotrexate 50 MG/2ML injection 0.4 mLs, Intramuscular, Weekly   mirtazapine (REMERON) 7.5 mg, Oral, Daily at bedtime   Multiple Vitamins-Minerals (CENTRUM SILVER 50+WOMEN PO) 1 tablet, Oral, Daily   nitroGLYCERIN (NITROSTAT) 0.4 MG SL tablet Dissolve 1 tablet under the tongue every 5 minutes as needed for chest pain. Max of 3 doses, then 911.   predniSONE (DELTASONE) 5 mg, Oral, Daily with  breakfast   pregabalin (LYRICA) 75 mg, Oral, Nightly   pregabalin (LYRICA) 50 mg, Oral, Every morning   protein supplement shake (PREMIER PROTEIN) LIQD 2 oz, Oral, 2 times daily   pyrithione zinc (SELSUN BLUE DRY SCALP) 1 % shampoo 1 application , Topical, Weekly   RESTASIS 0.05 % ophthalmic emulsion 1  drop, Both Eyes, 2 times daily   traMADol (ULTRAM) 50 mg, Oral, Every 6 hours PRN    Diet Orders (From admission, onward)     Start     Ordered   04/05/23 0001  Diet NPO time specified Except for: Sips with Meds  Diet effective midnight       Question:  Except for  Answer:  Sips with Meds   04/04/23 0906            DVT prophylaxis: SCDs Start: 04/04/23 0154   Lab Results  Component Value Date   PLT 177 04/05/2023      Code Status: Limited: Do not attempt resuscitation (DNR) -DNR-LIMITED -Do Not Intubate/DNI   Family Communication: no family at bedside   Status is: Inpatient Remains inpatient appropriate because: severity of illness  Level of care: Telemetry  Consultants:  GI  Objective: Vitals:   04/04/23 1512 04/04/23 2031 04/04/23 2315 04/05/23 0316  BP: (!) 131/44 (!) 104/57 (!) 148/63 (!) 137/58  Pulse: 70 61 69 60  Resp: 17 16 13 13   Temp: 98.9 F (37.2 C) 97.8 F (36.6 C) 98.2 F (36.8 C) 98.4 F (36.9 C)  TempSrc: Oral     SpO2: 98% 97% 98% 98%  Weight:      Height:        Intake/Output Summary (Last 24 hours) at 04/05/2023 1308 Last data filed at 04/05/2023 0316 Gross per 24 hour  Intake 151.41 ml  Output --  Net 151.41 ml   Wt Readings from Last 3 Encounters:  04/03/23 79.8 kg  03/11/23 55.7 kg  03/05/23 54.8 kg    Examination:  Constitutional: NAD Eyes: lids and conjunctivae normal, no scleral icterus ENMT: mmm Neck: normal, supple Respiratory: clear to auscultation bilaterally, no wheezing, no crackles. Normal respiratory effort.  Cardiovascular: Regular rate and rhythm, no murmurs / rubs / gallops. No LE edema. Abdomen: soft, no distention, no tenderness. Bowel sounds positive.   Data Reviewed: I have independently reviewed following labs and imaging studies   CBC Recent Labs  Lab 04/03/23 1823 04/04/23 0414 04/05/23 0520  WBC 11.4* 9.9 10.8*  HGB 11.3* 11.7* 10.1*  HCT 36.1 37.8 32.1*  PLT 227 209 177  MCV 97.6 98.2  97.0  MCH 30.5 30.4 30.5  MCHC 31.3 31.0 31.5  RDW 17.6* 17.8* 17.2*  LYMPHSABS 1.2 1.7  --   MONOABS 0.5 1.0  --   EOSABS 0.0 0.1  --   BASOSABS 0.0 0.0  --     Recent Labs  Lab 04/03/23 1823 04/04/23 0414 04/05/23 0520  NA 137 142 138  K 5.7* 4.5 4.1  CL 102 107 106  CO2 26 26 22   GLUCOSE 310* 78 256*  BUN 63* 55* 46*  CREATININE 1.51* 1.34* 1.23*  CALCIUM 8.6* 8.7* 7.6*  AST 26 23 39  ALT 59* 53* 75*  ALKPHOS 77 72 85  BILITOT 0.6 0.9 1.2  ALBUMIN 3.3* 3.4* 2.8*  MG  --   --  2.4  HGBA1C  --  9.2*  --     ------------------------------------------------------------------------------------------------------------------ No results for input(s): "CHOL", "HDL", "LDLCALC", "TRIG", "CHOLHDL", "LDLDIRECT" in the last 72  hours.  Lab Results  Component Value Date   HGBA1C 9.2 (H) 04/04/2023   ------------------------------------------------------------------------------------------------------------------ No results for input(s): "TSH", "T4TOTAL", "T3FREE", "THYROIDAB" in the last 72 hours.  Invalid input(s): "FREET3"  Cardiac Enzymes No results for input(s): "CKMB", "TROPONINI", "MYOGLOBIN" in the last 168 hours.  Invalid input(s): "CK" ------------------------------------------------------------------------------------------------------------------    Component Value Date/Time   BNP 991.2 (H) 08/12/2022 0415    CBG: Recent Labs  Lab 04/04/23 1014 04/04/23 1149 04/04/23 1640 04/04/23 2215 04/05/23 0753  GLUCAP 173* 188* 243* 240* 263*    No results found for this or any previous visit (from the past 240 hours).   Radiology Studies: No results found.    Pamella Pert, MD, PhD [facility]  Between 7 am - 7 pm I am available, please contact me via Amion (for emergencies) or Securechat (non urgent messages)  Between 7 pm - 7 am I am not available, please contact night coverage MD/APP via Amion

## 2023-04-06 DIAGNOSIS — K8050 Calculus of bile duct without cholangitis or cholecystitis without obstruction: Secondary | ICD-10-CM | POA: Diagnosis not present

## 2023-04-06 LAB — GLUCOSE, CAPILLARY
Glucose-Capillary: 273 mg/dL — ABNORMAL HIGH (ref 70–99)
Glucose-Capillary: 251 mg/dL — ABNORMAL HIGH (ref 70–99)
Glucose-Capillary: 340 mg/dL — ABNORMAL HIGH (ref 70–99)
Glucose-Capillary: 260 mg/dL — ABNORMAL HIGH (ref 70–99)

## 2023-04-06 MED ORDER — LIDOCAINE 5 % EX PTCH: 1.0000 | MEDICATED_PATCH | CUTANEOUS | 0 refills | Status: DC

## 2023-04-06 MED ORDER — TRAMADOL HCL 50 MG PO TABS
50.0000 mg | ORAL_TABLET | Freq: Once | ORAL | Status: AC
  Administered 2023-04-06: 50 mg via ORAL
  Filled 2023-04-06: qty 1

## 2023-04-06 NOTE — TOC Progression Note (Signed)
Transition of Care [facility]) - Progression Note    Patient Details  Name: Shelby Walker MRN: [id number] Date of Birth: 1933-08-26  Transition of Care [facility]) CM/SW Contact  Adrian Prows, RN Phone Number: 04/06/2023, 2:54 PM  Clinical Narrative:    D/C orders cancelled; Lysbeth Galas at [facility] notified; notified family would like to discuss options for care after d/c; spoke w/ pt's dtr [name] and son [name] in room; they are concerned about pt d/c home w/ HHPT d/t weakness; her dtr says she cannot stay w/ pt full time as her husband recently had a knee replacement; explained PT is to re-evaluate pt; also explained SNF process/ins auth needed vs HH services vs private duty out of pocket; they verbalized understanding; awaiting PT eval.   Expected Discharge Plan: Home w Home Health Services Barriers to Discharge: No Barriers Identified  Expected Discharge Plan and Services In-house Referral: Clinical Social Work Discharge Planning Services: NA Post Acute Care Choice: Home Health, Resumption of Svcs/PTA Provider Living arrangements for the past 2 months: Single Family Home Expected Discharge Date: 04/06/23               DME Arranged: N/A DME Agency: NA       HH Arranged: PT, RN HH Agency: [facility] Date HH Agency Contacted: 04/05/23 Time HH Agency Contacted: [phone] Representative spoke with at [facility] Agency: Haywood Lasso   Social Determinants of Health (SDOH) Interventions SDOH Screenings   Food Insecurity: No Food Insecurity (04/04/2023)  Housing: Low Risk  (04/04/2023)  Transportation Needs: No Transportation Needs (04/04/2023)  Utilities: Not At Risk (04/04/2023)  Depression (PHQ2-9): Low Risk  (05/28/2022)  Financial Resource Strain: Low Risk  (07/24/2018)  Physical Activity: Sufficiently Active (07/24/2018)  Social Connections: Socially Integrated (04/04/2023)  Stress: No Stress Concern Present (07/24/2018)  Tobacco Use: Low Risk  (04/05/2023)    Readmission Risk  Interventions    04/05/2023    2:29 PM 02/02/2022    1:25 PM  Readmission Risk Prevention Plan  Transportation Screening Complete   PCP or Specialist Appt within 5-7 Days Complete   Home Care Screening Complete   Medication Review (RN CM) Complete   HRI or Home Care Consult  Complete  Social Work Consult for Recovery Care Planning/Counseling  Complete  Palliative Care Screening  Not Applicable  Medication Review Oceanographer)  Complete

## 2023-04-06 NOTE — Progress Notes (Signed)
HISTORY OF PRESENT ILLNESS:  Shelby Walker is a 89 y.o. female who underwent ERCP with sphincterotomy and common duct stone extraction yesterday.  Feeling well today.  Sitting up in bed eating lunch.  Son and daughter in room.  No complaints.  She looks great.  REVIEW OF SYSTEMS:  All non-GI ROS negative except for  Past Medical History:  Diagnosis Date   Bruit    Carotid Doppler showed no significant abnormality     9per patient)   C. difficile colitis 07/2018   with severe sepsis   Chest pain, unspecified    Nuclear, May, 2008, no scar or ischemia   Diabetes mellitus    Diverticulosis    Dyslipidemia    Ejection fraction    EF 55-60%, echo, February, 2011 // Echocardiogram 8/21: EF 60-65, no RWMA, Gr 1 DD, GLS -14%, normal RVSF, mild LAE, trivial MR, mild MS (mean 4 mmHg), RVSP 23.4    GERD (gastroesophageal reflux disease)    Mitral regurgitation 04/21/2009   mild,  echo, February, 2011   Osteoporosis    Palpitations    possible very brief atrial fibrillation on monitor and possible reentrant tachycardia   PONV (postoperative nausea and vomiting)    Psoriasis    Rheumatoid arthritis (HCC)     Past Surgical History:  Procedure Laterality Date   ABDOMINAL AORTOGRAM W/LOWER EXTREMITY Right 10/19/2020   Procedure: ABDOMINAL AORTOGRAM W/LOWER EXTREMITY;  Surgeon: Iran Ouch, MD;  Location: [facility]AS[facility];  Service: Cardiovascular;  Laterality: Right;   CHOLECYSTECTOMY N/A 08/09/2022   Procedure: LAPAROSCOPIC CHOLECYSTECTOMY WITH INTRAOPERATIVE CHOLANGIOGRAM;  Surgeon: Quentin Ore, MD;  Location: [facility] OR;  Service: General;  Laterality: N/A;   ENDOSCOPIC RETROGRADE CHOLANGIOPANCREATOGRAPHY (ERCP) WITH PROPOFOL N/A 04/05/2023   Procedure: ENDOSCOPIC RETROGRADE CHOLANGIOPANCREATOGRAPHY (ERCP) WITH PROPOFOL;  Surgeon: Hilarie Fredrickson, MD;  Location: [facility] ENDOSCOPY;  Service: Gastroenterology;  Laterality: N/A;   FRACTURE SURGERY     LOOP RECORDER REMOVAL N/A 01/05/2022    Procedure: LOOP RECORDER REMOVAL;  Surgeon: Marinus Maw, MD;  Location: [facility];  Service: Cardiovascular;  Laterality: N/A;   PACEMAKER IMPLANT N/A 01/05/2022   Procedure: PACEMAKER IMPLANT;  Surgeon: Marinus Maw, MD;  Location: [facility];  Service: Cardiovascular;  Laterality: N/A;   PPM GENERATOR REMOVAL N/A 01/29/2022   Procedure: PPM GENERATOR REMOVAL;  Surgeon: Marinus Maw, MD;  Location: [facility];  Service: Cardiovascular;  Laterality: N/A;   REMOVAL OF STONES  04/05/2023   Procedure: REMOVAL OF STONES;  Surgeon: Hilarie Fredrickson, MD;  Location: [facility];  Service: Gastroenterology;;   Dennison Mascot  04/05/2023   Procedure: Dennison Mascot;  Surgeon: Hilarie Fredrickson, MD;  Location: [facility] ENDOSCOPY;  Service: Gastroenterology;;   TEE WITHOUT CARDIOVERSION N/A 01/29/2022   Procedure: TRANSESOPHAGEAL ECHOCARDIOGRAM (TEE);  Surgeon: Sande Rives, MD;  Location: [facility] ENDOSCOPY;  Service: Cardiovascular;  Laterality: N/A;   TRANSMETATARSAL AMPUTATION Right 01/18/2021   Procedure: TRANSMETATARSAL AMPUTATION;  Surgeon: Toni Arthurs, MD;  Location: [facility] OR;  Service: Orthopedics;  Laterality: Right;    Social History Shaunita Seney Callins  reports that she has never smoked. She has never used smokeless tobacco. She reports that she does not drink alcohol and does not use drugs.  family history includes Diabetes in her father and mother; Heart attack in her father and mother; Heart failure in her father and mother; Stroke in her sister.  Allergies  Allergen Reactions   Cholestyramine  Possible- Makes throat burn. Patient said she can't take it    Compazine [Prochlorperazine Edisylate] Swelling    REACTION: " tongue swell and unable to swallow"   Celecoxib Diarrhea   Sulfonamide Derivatives Other (See Comments)    REACTION: " broke out with fine itching bumps"   Hydrocodone Other (See Comments)   Infliximab Other (See Comments)    Other reaction(s): rash    Leflunomide Other (See Comments)    Other reaction(s): diarrhea   Prochlorperazine Other (See Comments)    Other reaction(s): Unknown  Other Reaction(s): tongue swells   Rosuvastatin     Other reaction(s): muscle aches   Sulfa Antibiotics Other (See Comments)    Other reaction(s): tongue swelling       PHYSICAL EXAMINATION: Vital signs: BP (!) 103/53 (BP Location: Left Arm)   Pulse 69   Temp 98.2 F (36.8 C) (Oral)   Resp 18   Ht 5\' 4"  (1.626 m)   Wt 79.8 kg   SpO2 99%   BMI 30.21 kg/m  General: Pleasant elderly female, well-developed, well-nourished, no acute distress HEENT: Anicteric Abdomen: soft, nontender, nondistended,  Psychiatric: alert and oriented x3. Cooperative     ASSESSMENT:  Choledocholithiasis status post ERCP with sphincterotomy and stone extraction.  Doing well.   PLAN:   Okay for discharge from GI standpoint.  No outpatient GI follow-up required.  Will sign off.  Thanks

## 2023-04-06 NOTE — Progress Notes (Signed)
PROGRESS NOTE  Shelby Walker ZOX:[id number] DOB: 08/06/1933 DOA: 04/03/2023 PCP: Adrian Prince, MD   LOS: 3 days   Brief Narrative / Interim history: 89 year old female with DM2, PAF, HLD, CKD 3, RA who was admitted in June 2024 for acute biliary pancreatitis, status postcholecystectomy, comes into the hospital for abdominal and back pain for the past week.  Patient is not the best historian but tells me that she has been having lower back pain, sometimes wrapping around the sides on the left as well as the right side, sometimes the pain is in the right upper quadrant also.  No nausea or vomiting, no fever or chills.  She was treated conservatively as an outpatient but due to lack of improvement underwent a CT scan which showed cholelithiasis, and she was directed to the ER  Subjective / 24h Interval events: She is overall doing well.  Complains of back pain, but states that it is controlled.  No abdominal discomfort, no nausea or vomiting  Assesement and Plan: Principal Problem:   Choledocholithiasis Active Problems:   Diabetes mellitus (HCC)   PAD (peripheral artery disease) (HCC)   Paroxysmal atrial fibrillation (HCC)   Rheumatoid arthritis (HCC)   Stage 3b chronic kidney disease (CKD) (HCC)   DNR (do not resuscitate)   Elevated LFTs   Anemia  Principal problem Choledocholithiasis-white count is normal, LFTs are normal, there is no signs of cholangitis.  Gastroenterology consulted, patient underwent ERCP 1/31 with sphincterotomy and stone removal, she tolerated procedure well, recovered well, diet was advanced and is able to tolerate a regular diet. -Show plans were today to be discharged home, however patient and family feels like she is significantly weaker, will reengage physical therapy and hold discharge  Active problems Mild superior endplate deformity of the L1 vertebra -this appears to be new, could potentially explain some of her back pain.  Conservative management, PT  consult, home health was arranged.  She responded well to lidocaine patch, will be prescribed upon discharge   PAF-continue amiodarone, apparently not on anticoagulation due to fall risk.  In sinus rhythm  CKD 3B-baseline creatinine 1.3-1.7, creatinine remained at baseline  Anemia of chronic disease-hemoglobin stable, no bleeding  Chronic wound of the left lower extremity-outpatient follow-up  DM 2-continue home regimen  Scheduled Meds:  amiodarone  100 mg Oral Daily   amLODipine  5 mg Oral Daily   hydrocortisone sod succinate (SOLU-CORTEF) inj  50 mg Intravenous Q8H   indomethacin  100 mg Rectal Once   insulin aspart  0-9 Units Subcutaneous TID WC   lidocaine  1 patch Transdermal Q24H   Continuous Infusions:   PRN Meds:.acetaminophen, ondansetron (ZOFRAN) IV  Current Outpatient Medications  Medication Instructions   acetaminophen (TYLENOL) 650 mg, Oral, Every 6 hours PRN   acetaminophen-codeine (TYLENOL #3) 300-30 MG tablet 1 tablet, Oral, Every 4 hours PRN   amiodarone (PACERONE) 100 mg, Oral, Daily   atorvastatin (LIPITOR) 20 mg, Oral, Daily   Calcium Carbonate-Vitamin D 600-400 MG-UNIT tablet 1 tablet, Oral, Daily,     diclofenac Sodium (VOLTAREN) 4 g, Topical, 4 times daily   esomeprazole (NEXIUM) 20 mg, Oral, Daily   folic acid (FOLVITE) 1 mg, Oral, Daily   insulin aspart (NOVOLOG) 2 Units, Subcutaneous, 3 times daily with meals, Sliding scale   Insulin Degludec (TRESIBA) 4 Units, Subcutaneous, Daily at bedtime   iron polysaccharides (NIFEREX) 150 mg, Oral, Daily   [START ON 04/07/2023] lidocaine (LIDODERM) 5 % 1 patch, Transdermal, Every 24 hours, Remove & Discard  patch within 12 hours or as directed by MD   methocarbamol (ROBAXIN) 500 mg, Oral, As needed   methotrexate 50 MG/2ML injection 0.4 mLs, Intramuscular, Weekly   mirtazapine (REMERON) 7.5 mg, Oral, Daily at bedtime   Multiple Vitamins-Minerals (CENTRUM SILVER 50+WOMEN PO) 1 tablet, Oral, Daily   nitroGLYCERIN  (NITROSTAT) 0.4 MG SL tablet Dissolve 1 tablet under the tongue every 5 minutes as needed for chest pain. Max of 3 doses, then 911.   predniSONE (DELTASONE) 5 mg, Oral, Daily with breakfast   pregabalin (LYRICA) 75 mg, Oral, Nightly   pregabalin (LYRICA) 50 mg, Oral, Every morning   protein supplement shake (PREMIER PROTEIN) LIQD 2 oz, Oral, 2 times daily   pyrithione zinc (SELSUN BLUE DRY SCALP) 1 % shampoo 1 application , Topical, Weekly   RESTASIS 0.05 % ophthalmic emulsion 1 drop, Both Eyes, 2 times daily   traMADol (ULTRAM) 50 mg, Oral, Every 6 hours PRN    Diet Orders (From admission, onward)     Start     Ordered   04/06/23 0942  Diet regular Room service appropriate? Yes; Fluid consistency: Thin  Diet effective now       Question Answer Comment  Room service appropriate? Yes   Fluid consistency: Thin      04/06/23 0941            DVT prophylaxis: SCDs Start: 04/04/23 0154   Lab Results  Component Value Date   PLT 177 04/05/2023      Code Status: Limited: Do not attempt resuscitation (DNR) -DNR-LIMITED -Do Not Intubate/DNI   Family Communication: no family at bedside   Status is: Inpatient Remains inpatient appropriate because: severity of illness  Level of care: Telemetry  Consultants:  GI  Objective: Vitals:   04/05/23 2134 04/06/23 0213 04/06/23 0604 04/06/23 1254  BP: (!) 90/46 131/71 (!) 103/53 (!) 124/53  Pulse: 69 79 69 69  Resp: 18 18 18 18   Temp: 98.6 F (37 C) 98.4 F (36.9 C) 98.2 F (36.8 C) 98.5 F (36.9 C)  TempSrc: Oral Oral Oral Oral  SpO2: 99% 99% 99% 99%  Weight:      Height:        Intake/Output Summary (Last 24 hours) at 04/06/2023 1333 Last data filed at 04/05/2023 1900 Gross per 24 hour  Intake 360 ml  Output --  Net 360 ml   Wt Readings from Last 3 Encounters:  04/03/23 79.8 kg  03/11/23 55.7 kg  03/05/23 54.8 kg    Examination:  Constitutional: NAD Eyes: lids and conjunctivae normal, no scleral icterus ENMT:  mmm Neck: normal, supple Respiratory: clear to auscultation bilaterally, no wheezing, no crackles.  Cardiovascular: Regular rate and rhythm, no murmurs / rubs / gallops. Abdomen: soft, no distention, no tenderness. Bowel sounds positive.   Data Reviewed: I have independently reviewed following labs and imaging studies   CBC Recent Labs  Lab 04/03/23 1823 04/04/23 0414 04/05/23 0520  WBC 11.4* 9.9 10.8*  HGB 11.3* 11.7* 10.1*  HCT 36.1 37.8 32.1*  PLT 227 209 177  MCV 97.6 98.2 97.0  MCH 30.5 30.4 30.5  MCHC 31.3 31.0 31.5  RDW 17.6* 17.8* 17.2*  LYMPHSABS 1.2 1.7  --   MONOABS 0.5 1.0  --   EOSABS 0.0 0.1  --   BASOSABS 0.0 0.0  --     Recent Labs  Lab 04/03/23 1823 04/04/23 0414 04/05/23 0520  NA 137 142 138  K 5.7* 4.5 4.1  CL 102 107 106  CO2 26 26 22   GLUCOSE 310* 78 256*  BUN 63* 55* 46*  CREATININE 1.51* 1.34* 1.23*  CALCIUM 8.6* 8.7* 7.6*  AST 26 23 39  ALT 59* 53* 75*  ALKPHOS 77 72 85  BILITOT 0.6 0.9 1.2  ALBUMIN 3.3* 3.4* 2.8*  MG  --   --  2.4  HGBA1C  --  9.2*  --     ------------------------------------------------------------------------------------------------------------------ No results for input(s): "CHOL", "HDL", "LDLCALC", "TRIG", "CHOLHDL", "LDLDIRECT" in the last 72 hours.  Lab Results  Component Value Date   HGBA1C 9.2 (H) 04/04/2023   ------------------------------------------------------------------------------------------------------------------ No results for input(s): "TSH", "T4TOTAL", "T3FREE", "THYROIDAB" in the last 72 hours.  Invalid input(s): "FREET3"  Cardiac Enzymes No results for input(s): "CKMB", "TROPONINI", "MYOGLOBIN" in the last 168 hours.  Invalid input(s): "CK" ------------------------------------------------------------------------------------------------------------------    Component Value Date/Time   BNP 991.2 (H) 08/12/2022 0415    CBG: Recent Labs  Lab 04/05/23 1514 04/05/23 1746  04/05/23 2134 04/06/23 0747 04/06/23 1156  GLUCAP 313* 275* 253* 273* 251*    No results found for this or any previous visit (from the past 240 hours).   Radiology Studies: No results found.    Pamella Pert, MD, PhD [facility]  Between 7 am - 7 pm I am available, please contact me via Amion (for emergencies) or Securechat (non urgent messages)  Between 7 pm - 7 am I am not available, please contact night coverage MD/APP via Amion

## 2023-04-06 NOTE — Discharge Summary (Deleted)
Physician Discharge Summary  [name]:[id number] DOB: 05-12-1933 DOA: 04/03/2023  PCP: Adrian Prince, MD  Admit date: 04/03/2023 Discharge date: 04/06/2023  Admitted From: home Disposition:  home  Recommendations for Outpatient Follow-up:  Follow up with PCP in 1-2 weeks  Home Health: PT Equipment/Devices: none  Discharge Condition: stable CODE STATUS: DNR Diet Orders (From admission, onward)     Start     Ordered   04/06/23 0942  Diet regular Room service appropriate? Yes; Fluid consistency: Thin  Diet effective now       Question Answer Comment  Room service appropriate? Yes   Fluid consistency: Thin      04/06/23 0941            HPI: Per admitting MD,  Shelby Walker is a 89 y.o. female with history of diabetes mellitus type 2, paroxysmal atrial fibrillation, hyperlipidemia, GERD, chronic kidney disease stage III, rheumatoid arthritis, anemia who was admitted in June 2024 for acute biliary pancreatitis at that time patient also was septic eventually underwent cholecystectomy has been experiencing abdominal pain and low back pain for the last week.  Had initially gone to her primary care physician was treated for possible musculoskeletal pain but since patient's pain was not getting better primary care are ordered CT scan which showed 2 small stones in the CBD and was advised to come to the ER.  Patient's pain is across the abdomen and low back.  Denies any nausea vomiting fever or chills.   Hospital Course / Discharge diagnoses: Principal Problem:   Choledocholithiasis Active Problems:   Diabetes mellitus (HCC)   PAD (peripheral artery disease) (HCC)   Paroxysmal atrial fibrillation (HCC)   Rheumatoid arthritis (HCC)   Stage 3b chronic kidney disease (CKD) (HCC)   DNR (do not resuscitate)   Elevated LFTs   Anemia   Principal problem Choledocholithiasis-white count is normal, LFTs are normal, there is no signs of cholangitis.  Gastroenterology consulted,  patient underwent ERCP 1/31 with sphincterotomy and stone removal, she tolerated procedure well, recovered well, diet was advanced and is able to tolerate a regular diet.  She will be discharged home in stable condition, was advised to follow-up with PCP   Active problems Mild superior endplate deformity of the L1 vertebra -this appears to be new, could potentially explain some of her back pain.  Conservative management, PT consult, home health was arranged.  She responded well to lidocaine patch, will be prescribed upon discharge  PAF-continue amiodarone, apparently not on anticoagulation due to fall risk.  In sinus rhythm CKD 3B-baseline creatinine 1.3-1.7, creatinine remained at baseline Anemia of chronic disease-hemoglobin stable, no bleeding Chronic wound of the left lower extremity-outpatient follow-up DM 2-continue home regimen  Sepsis ruled out   Discharge Instructions   Allergies as of 04/06/2023       Reactions   Cholestyramine    Possible- Makes throat burn. Patient said she can't take it    Compazine [prochlorperazine Edisylate] Swelling   REACTION: " tongue swell and unable to swallow"   Celecoxib Diarrhea   Sulfonamide Derivatives Other (See Comments)   REACTION: " broke out with fine itching bumps"   Hydrocodone Other (See Comments)   Infliximab Other (See Comments)   Other reaction(s): rash   Leflunomide Other (See Comments)   Other reaction(s): diarrhea   Prochlorperazine Other (See Comments)   Other reaction(s): Unknown Other Reaction(s): tongue swells   Rosuvastatin    Other reaction(s): muscle aches   Sulfa Antibiotics Other (See  Comments)   Other reaction(s): tongue swelling        Medication List     TAKE these medications    acetaminophen 325 MG tablet Commonly known as: TYLENOL Take 650 mg by mouth every 6 (six) hours as needed for moderate pain (pain score 4-6).   acetaminophen-codeine 300-30 MG tablet Commonly known as: TYLENOL #3 Take 1  tablet by mouth every 4 (four) hours as needed for moderate pain (pain score 4-6).   amiodarone 200 MG tablet Commonly known as: PACERONE Take 0.5 tablets (100 mg total) by mouth daily.   atorvastatin 20 MG tablet Commonly known as: LIPITOR Take 1 tablet by mouth once daily   Calcium Carbonate-Vitamin D 600-400 MG-UNIT tablet Take 1 tablet by mouth daily at 12 noon.   CENTRUM SILVER 50+WOMEN PO Take 1 tablet by mouth daily.   diclofenac Sodium 1 % Gel Commonly known as: VOLTAREN Apply 4 g topically 4 (four) times daily. What changed:  when to take this reasons to take this   esomeprazole 20 MG capsule Commonly known as: NEXIUM Take 20 mg by mouth daily at 12 noon.   folic acid 1 MG tablet Commonly known as: FOLVITE Take 1 mg by mouth daily.   insulin aspart 100 UNIT/ML FlexPen Commonly known as: NOVOLOG Inject 2 Units into the skin 3 (three) times daily with meals. Sliding scale What changed: how much to take   Insulin Degludec 100 UNIT/ML Soln Commonly known as: Tresiba Inject 4 Units into the skin at bedtime. What changed: how much to take   iron polysaccharides 150 MG capsule Commonly known as: NIFEREX Take 150 mg by mouth daily.   lidocaine 5 % Commonly known as: LIDODERM Place 1 patch onto the skin daily. Remove & Discard patch within 12 hours or as directed by MD Start taking on: April 07, 2023   methocarbamol 500 MG tablet Commonly known as: ROBAXIN Take 500 mg by mouth as needed for muscle spasms.   methotrexate 50 MG/2ML injection Inject 0.4 mLs into the muscle once a week.   mirtazapine 7.5 MG tablet Commonly known as: REMERON Take 7.5 mg by mouth at bedtime.   nitroGLYCERIN 0.4 MG SL tablet Commonly known as: NITROSTAT Dissolve 1 tablet under the tongue every 5 minutes as needed for chest pain. Max of 3 doses, then 911.   predniSONE 5 MG tablet Commonly known as: DELTASONE Take 1 tablet (5 mg total) by mouth daily with breakfast.    pregabalin 75 MG capsule Commonly known as: LYRICA Take 75 mg by mouth at bedtime.   pregabalin 50 MG capsule Commonly known as: LYRICA Take 50 mg by mouth in the morning.   protein supplement shake Liqd Commonly known as: PREMIER PROTEIN Take 2 oz by mouth 2 (two) times daily.   Restasis 0.05 % ophthalmic emulsion Generic drug: cycloSPORINE Place 1 drop into both eyes 2 (two) times daily.   Selsun Blue Dry Scalp 1 % shampoo Generic drug: pyrithione zinc Apply 1 application topically once a week.   traMADol 50 MG tablet Commonly known as: ULTRAM Take 50 mg by mouth every 6 (six) hours as needed for moderate pain (pain score 4-6).        Follow-up Information     [facility] Follow up.   Specialty: Home Health Services Why: [facility] to follow up with you at discharge to provide home health services Contact information: [address] [place] [address] [phone]  Adrian Prince, MD Follow up in 1 week(s).   Specialty: Endocrinology Contact information: [address] [place] [address] [phone]                 Consultations: GI  Procedures/Studies: ERCP  DG ERCP Result Date: 04/05/2023 CLINICAL DATA:  Choledocholithiasis. EXAM: ERCP TECHNIQUE: Multiple spot images obtained with the fluoroscopic device and submitted for interpretation post-procedure. FLUOROSCOPY: Radiation Exposure Index (as provided by the fluoroscopic device): 41 mGy Kerma COMPARISON:  CT of the abdomen and pelvis on 04/03/2023 FINDINGS: Submitted imaging with a C-arm demonstrates cannulation of the common bile duct with cholangiogram demonstrating mild dilatation of the common bile duct. A balloon sweep maneuver was performed to remove material. There is evidence of prior cholecystectomy. IMPRESSION: ERCP with balloon sweep maneuver performed to remove common bile duct calculi. These images were submitted for radiologic  interpretation only. Please see the procedural report for the amount of contrast and the fluoroscopy time utilized. Electronically Signed   By: Irish Lack M.D.   On: 04/05/2023 16:33   CT ABDOMEN PELVIS W CONTRAST Addendum Date: 04/03/2023 ADDENDUM REPORT: 04/03/2023 17:11 ADDENDUM: These results were called by telephone at the time of interpretation on 04/03/2023 at 5:11 pm to provider Dour Clelia Croft, who verbally acknowledged these results. Electronically Signed   By: Signa Kell M.D.   On: 04/03/2023 17:11   Result Date: 04/03/2023 CLINICAL DATA:  History of pancreatitis. Status post cholecystectomy. Complains of severe lower abdominal pain and distension. EXAM: CT ABDOMEN AND PELVIS WITH CONTRAST TECHNIQUE: Multidetector CT imaging of the abdomen and pelvis was performed using the standard protocol following bolus administration of intravenous contrast. RADIATION DOSE REDUCTION: This exam was performed according to the departmental dose-optimization program which includes automated exposure control, adjustment of the mA and/or kV according to patient size and/or use of iterative reconstruction technique. CONTRAST:  75mL ISOVUE-300 IOPAMIDOL (ISOVUE-300) INJECTION 61% COMPARISON:  MRI/MRCP from 08/10/2022.  CT AP from 08/07/2022. FINDINGS: Lower chest: Scarring noted within the lung bases. No pleural or pericardial effusion. Hepatobiliary: No focal liver abnormality. Status post cholecystectomy. The common bile duct measures 8 mm in diameter which is unchanged compared with the previous exam. No significant intrahepatic bile duct dilatation. Within the distal common bile duct there are 2 gallstones identified which measure up to 5 mm, image 39/4. Pancreas: Unremarkable. No pancreatic ductal dilatation or surrounding inflammatory changes. Spleen: Normal in size without focal abnormality. Adrenals/Urinary Tract: Normal adrenal glands. Bilateral Bosniak class 1 cysts are identified. The largest arises off the  lateral cortex of the right kidney measuring 2.8 cm, image 22/2. No follow-up imaging recommended. Arising off the upper pole of the right kidney is a exophytic cyst which measures 41 Hounsfield units and 1.3 cm. On the previous MRI this was uniformly T1 hyperintense and T2 hypointense compatible with a benign Bosniak class 2 cyst. No follow-up imaging recommended. No nephrolithiasis or obstructive uropathy. Urinary bladder appears normal. Stomach/Bowel: Stomach is normal. No pathologic dilatation of the large or small bowel loops to suggest a bowel obstruction. Fecalized distal small bowel loops are noted. No small bowel wall thickening, inflammation or distension. Mild to moderate stool burden noted throughout the colon. Severe sigmoid diverticulosis. No signs of acute diverticulitis. The appendix is visualized and appears normal. Vascular/Lymphatic: Aortic atherosclerosis without aneurysm. The upper abdominal vascularity appears patent. Reproductive: Uterus and bilateral adnexa are unremarkable. Other: No free fluid or fluid collections. No signs of pneumoperitoneum. Musculoskeletal: Multilevel lumbar spondylosis identified. Anterolisthesis of L4 on  L5 measures 5 mm. Mild superior endplate deformity involving the L1 vertebra is identified, new compared with the exam from 08/07/2022. There is mild retropulsion of the fracture fragments by approximately 3 mm. IMPRESSION: 1. Signs of choledocholithiasis status post cholecystectomy. There are 2 stones identified within the distal common bile duct which measure up to 5 mm. 2. Severe sigmoid diverticulosis without signs of acute diverticulitis. 3. Mild superior endplate deformity involving the L1 vertebra is identified, new compared with the exam from 08/07/2022. There is mild retropulsion of the fracture fragments by approximately 3 mm. 4.  Aortic Atherosclerosis (ICD10-I70.0). Electronically Signed: By: Signa Kell M.D. On: 04/03/2023 17:07     Subjective: -  no chest pain, shortness of breath, no abdominal pain, nausea or vomiting.   Discharge Exam: BP (!) 103/53 (BP Location: Left Arm)   Pulse 69   Temp 98.2 F (36.8 C) (Oral)   Resp 18   Ht 5\' 4"  (1.626 m)   Wt 79.8 kg   SpO2 99%   BMI 30.21 kg/m   General: Pt is alert, awake, not in acute distress Cardiovascular: RRR, S1/S2 +, no rubs, no gallops Respiratory: CTA bilaterally, no wheezing, no rhonchi Abdominal: Soft, NT, ND, bowel sounds + Extremities: no edema, no cyanosis    The results of significant diagnostics from this hospitalization (including imaging, microbiology, ancillary and laboratory) are listed below for reference.     Microbiology: No results found for this or any previous visit (from the past 240 hours).   Labs: Basic Metabolic Panel: Recent Labs  Lab 04/03/23 1823 04/04/23 0414 04/05/23 0520  NA 137 142 138  K 5.7* 4.5 4.1  CL 102 107 106  CO2 26 26 22   GLUCOSE 310* 78 256*  BUN 63* 55* 46*  CREATININE 1.51* 1.34* 1.23*  CALCIUM 8.6* 8.7* 7.6*  MG  --   --  2.4  PHOS  --   --  4.1   Liver Function Tests: Recent Labs  Lab 04/03/23 1823 04/04/23 0414 04/05/23 0520  AST 26 23 39  ALT 59* 53* 75*  ALKPHOS 77 72 85  BILITOT 0.6 0.9 1.2  PROT 6.2* 6.2* 5.2*  ALBUMIN 3.3* 3.4* 2.8*   CBC: Recent Labs  Lab 04/03/23 1823 04/04/23 0414 04/05/23 0520  WBC 11.4* 9.9 10.8*  NEUTROABS 9.5* 6.8  --   HGB 11.3* 11.7* 10.1*  HCT 36.1 37.8 32.1*  MCV 97.6 98.2 97.0  PLT 227 209 177   CBG: Recent Labs  Lab 04/05/23 1514 04/05/23 1746 04/05/23 2134 04/06/23 0747 04/06/23 1156  GLUCAP 313* 275* 253* 273* 251*   Hgb A1c Recent Labs    04/04/23 0414  HGBA1C 9.2*   Lipid Profile No results for input(s): "CHOL", "HDL", "LDLCALC", "TRIG", "CHOLHDL", "LDLDIRECT" in the last 72 hours. Thyroid function studies No results for input(s): "TSH", "T4TOTAL", "T3FREE", "THYROIDAB" in the last 72 hours.  Invalid input(s): "FREET3" Urinalysis     Component Value Date/Time   COLORURINE YELLOW 02/04/2022 0730   APPEARANCEUR CLOUDY (A) 02/04/2022 0730   LABSPEC 1.008 02/04/2022 0730   PHURINE 7.0 02/04/2022 0730   GLUCOSEU 50 (A) 02/04/2022 0730   HGBUR SMALL (A) 02/04/2022 0730   BILIRUBINUR NEGATIVE 02/04/2022 0730   KETONESUR NEGATIVE 02/04/2022 0730   PROTEINUR 30 (A) 02/04/2022 0730   NITRITE NEGATIVE 02/04/2022 0730   LEUKOCYTESUR LARGE (A) 02/04/2022 0730    FURTHER DISCHARGE INSTRUCTIONS:   Get Medicines reviewed and adjusted: Please take all your medications with you for your  next visit with your Primary MD   Laboratory/radiological data: Please request your Primary MD to go over all hospital tests and procedure/radiological results at the follow up, please ask your Primary MD to get all Hospital records sent to his/her office.   In some cases, they will be blood work, cultures and biopsy results pending at the time of your discharge. Please request that your primary care M.D. goes through all the records of your hospital data and follows up on these results.   Also Note the following: If you experience worsening of your admission symptoms, develop shortness of breath, life threatening emergency, suicidal or homicidal thoughts you must seek medical attention immediately by calling 911 or calling your MD immediately  if symptoms less severe.   You must read complete instructions/literature along with all the possible adverse reactions/side effects for all the Medicines you take and that have been prescribed to you. Take any new Medicines after you have completely understood and accpet all the possible adverse reactions/side effects.    Do not drive when taking Pain medications or sleeping medications (Benzodaizepines)   Do not take more than prescribed Pain, Sleep and Anxiety Medications. It is not advisable to combine anxiety,sleep and pain medications without talking with your primary care practitioner   Special  Instructions: If you have smoked or chewed Tobacco  in the last 2 yrs please stop smoking, stop any regular Alcohol  and or any Recreational drug use.   Wear Seat belts while driving.   Please note: You were cared for by a hospitalist during your hospital stay. Once you are discharged, your primary care physician will handle any further medical issues. Please note that NO REFILLS for any discharge medications will be authorized once you are discharged, as it is imperative that you return to your primary care physician (or establish a relationship with a primary care physician if you do not have one) for your post hospital discharge needs so that they can reassess your need for medications and monitor your lab values.  Time coordinating discharge: 35 minutes  SIGNED:  Pamella Pert, MD, PhD 04/06/2023, 12:20 PM

## 2023-04-06 NOTE — TOC Transition Note (Signed)
Transition of Care [facility]) - Discharge Note   Patient Details  Name: Shelby Walker MRN: [id number] Date of Birth: Mar 15, 1933  Transition of [facility]) CM/SW Contact:  Adrian Prows, RN Phone Number: 04/06/2023, 12:50 PM   Clinical Narrative:    D/C orders received; pt active w/ [facility] for HHPT/OT; Marisue Humble at agency notified; no TOC needs.   Final next level of care: Home w Home Health Services Barriers to Discharge: No Barriers Identified   Patient Goals and CMS Choice Patient states their goals for this hospitalization and ongoing recovery are:: To return home          Discharge Placement                       Discharge Plan and Services Additional resources added to the After Visit Summary for   In-house Referral: Clinical Social Work Discharge Planning Services: NA Post Acute Care Choice: Home Health, Resumption of Svcs/PTA Provider          DME Arranged: N/A DME Agency: NA       HH Arranged: PT, RN HH Agency: [facility] Date HH Agency Contacted: 04/05/23 Time HH Agency Contacted: 1431 Representative spoke with at [employer] Agency: Haywood Lasso  Social Drivers of Health (SDOH) Interventions SDOH Screenings   Food Insecurity: No Food Insecurity (04/04/2023)  Housing: Low Risk  (04/04/2023)  Transportation Needs: No Transportation Needs (04/04/2023)  Utilities: Not At Risk (04/04/2023)  Depression (PHQ2-9): Low Risk  (05/28/2022)  Financial Resource Strain: Low Risk  (07/24/2018)  Physical Activity: Sufficiently Active (07/24/2018)  Social Connections: Socially Integrated (04/04/2023)  Stress: No Stress Concern Present (07/24/2018)  Tobacco Use: Low Risk  (04/05/2023)     Readmission Risk Interventions    04/05/2023    2:29 PM 02/02/2022    1:25 PM  Readmission Risk Prevention Plan  Transportation Screening Complete   PCP or Specialist Appt within 5-7 Days Complete   Home Care Screening Complete   Medication Review (RN CM) Complete   HRI or  Home Care Consult  Complete  Social Work Consult for Recovery Care Planning/Counseling  Complete  Palliative Care Screening  Not Applicable  Medication Review [employer])  Complete

## 2023-04-06 NOTE — Discharge Instructions (Signed)
Follow with Shelby Prince, MD in 5-7 days  Please get a complete blood count and chemistry panel checked by your Primary MD at your next visit, and again as instructed by your Primary MD. Please get your medications reviewed and adjusted by your Primary MD.  Please request your Primary MD to go over all Hospital Tests and Procedure/Radiological results at the follow up, please get all Hospital records sent to your Prim MD by signing hospital release before you go home.  In some cases, there will be blood work, cultures and biopsy results pending at the time of your discharge. Please request that your primary care M.D. goes through all the records of your hospital data and follows up on these results.  If you had Pneumonia of Lung problems at the Hospital: Please get a 2 view Chest X ray done in 6-8 weeks after hospital discharge or sooner if instructed by your Primary MD.  If you have Congestive Heart Failure: Please call your Cardiologist or Primary MD anytime you have any of the following symptoms:  1) 3 pound weight gain in 24 hours or 5 pounds in 1 week  2) shortness of breath, with or without a dry hacking cough  3) swelling in the hands, feet or stomach  4) if you have to sleep on extra pillows at night in order to breathe  Follow cardiac low salt diet and 1.5 lit/day fluid restriction.  If you have diabetes Accuchecks 4 times/day, Once in AM empty stomach and then before each meal. Log in all results and show them to your primary doctor at your next visit. If any glucose reading is under 80 or above 300 call your primary MD immediately.  If you have Seizure/Convulsions/Epilepsy: Please do not drive, operate heavy machinery, participate in activities at heights or participate in high speed sports until you have seen by Primary MD or a Neurologist and advised to do so again. Per [facility] statutes, patients with seizures are not allowed to drive until they have been  seizure-free for six months.  Use caution when using heavy equipment or power tools. Avoid working on ladders or at heights. Take showers instead of baths. Ensure the water temperature is not too high on the home water heater. Do not go swimming alone. Do not lock yourself in a room alone (i.e. bathroom). When caring for infants or small children, sit down when holding, feeding, or changing them to minimize risk of injury to the child in the event you have a seizure. Maintain good sleep hygiene. Avoid alcohol.   If you had Gastrointestinal Bleeding: Please ask your Primary MD to check a complete blood count within one week of discharge or at your next visit. Your endoscopic/colonoscopic biopsies that are pending at the time of discharge, will also need to followed by your Primary MD.  Get Medicines reviewed and adjusted. Please take all your medications with you for your next visit with your Primary MD  Please request your Primary MD to go over all hospital tests and procedure/radiological results at the follow up, please ask your Primary MD to get all Hospital records sent to his/her office.  If you experience worsening of your admission symptoms, develop shortness of breath, life threatening emergency, suicidal or homicidal thoughts you must seek medical attention immediately by calling 911 or calling your MD immediately  if symptoms less severe.  You must read complete instructions/literature along with all the possible adverse reactions/side effects for all the Medicines you take  and that have been prescribed to you. Take any new Medicines after you have completely understood and accpet all the possible adverse reactions/side effects.   Do not drive or operate heavy machinery when taking Pain medications.   Do not take more than prescribed Pain, Sleep and Anxiety Medications  Special Instructions: If you have smoked or chewed Tobacco  in the last 2 yrs please stop smoking, stop any regular  Alcohol  and or any Recreational drug use.  Wear Seat belts while driving.  Please note You were cared for by a hospitalist during your hospital stay. If you have any questions about your discharge medications or the care you received while you were in the hospital after you are discharged, you can call the unit and asked to speak with the hospitalist on call if the hospitalist that took care of you is not available. Once you are discharged, your primary care physician will handle any further medical issues. Please note that NO REFILLS for any discharge medications will be authorized once you are discharged, as it is imperative that you return to your primary care physician (or establish a relationship with a primary care physician if you do not have one) for your aftercare needs so that they can reassess your need for medications and monitor your lab values.  You can reach the hospitalist office at phone [phone] or fax [phone]   If you do not have a primary care physician, you can call [phone] for a physician referral.  Activity: As tolerated with Full fall precautions use walker/cane & assistance as needed    Diet: regular  Disposition Home

## 2023-04-06 NOTE — Plan of Care (Signed)
   Problem: Education: Goal: Ability to describe self-care measures that may prevent or decrease complications (Diabetes Survival Skills Education) will improve Outcome: Progressing Goal: Individualized Educational Video(s) Outcome: Progressing   Problem: Coping: Goal: Ability to adjust to condition or change in health will improve Outcome: Progressing

## 2023-04-07 DIAGNOSIS — K8050 Calculus of bile duct without cholangitis or cholecystitis without obstruction: Secondary | ICD-10-CM | POA: Diagnosis not present

## 2023-04-07 LAB — GLUCOSE, CAPILLARY
Glucose-Capillary: 304 mg/dL — ABNORMAL HIGH (ref 70–99)
Glucose-Capillary: 220 mg/dL — ABNORMAL HIGH (ref 70–99)

## 2023-04-07 MED ORDER — TRAMADOL HCL 50 MG PO TABS: 50.0000 mg | ORAL_TABLET | Freq: Four times a day (QID) | ORAL | 0 refills | Status: DC | PRN

## 2023-04-07 MED ORDER — TRAMADOL HCL 50 MG PO TABS: 25.0000 mg | ORAL_TABLET | Freq: Two times a day (BID) | ORAL | Status: DC | PRN

## 2023-04-07 NOTE — Discharge Summary (Signed)
Physician Discharge Summary  Shelby Walker ZOX:[id number] DOB: 1934/01/01 DOA: 04/03/2023  PCP: Adrian Prince, MD  Admit date: 04/03/2023 Discharge date: 04/07/2023  Admitted From: home Disposition:  home  Recommendations for Outpatient Follow-up:  Follow up with PCP in 1-2 weeks  Home Health: PT Equipment/Devices: none  Discharge Condition: stable CODE STATUS: DNR Diet Orders (From admission, onward)     Start     Ordered   04/06/23 0942  Diet regular Room service appropriate? Yes; Fluid consistency: Thin  Diet effective now       Question Answer Comment  Room service appropriate? Yes   Fluid consistency: Thin      04/06/23 0941            HPI: Per admitting MD, Shelby Walker is a 89 y.o. female with history of diabetes mellitus type 2, paroxysmal atrial fibrillation, hyperlipidemia, GERD, chronic kidney disease stage III, rheumatoid arthritis, anemia who was admitted in June 2024 for acute biliary pancreatitis at that time patient also was septic eventually underwent cholecystectomy has been experiencing abdominal pain and low back pain for the last week.  Had initially gone to her primary care physician was treated for possible musculoskeletal pain but since patient's pain was not getting better primary care are ordered CT scan which showed 2 small stones in the CBD and was advised to come to the ER.  Patient's pain is across the abdomen and low back.  Denies any nausea vomiting fever or chills.   Hospital Course / Discharge diagnoses: Principal Problem:   Choledocholithiasis Active Problems:   Diabetes mellitus (HCC)   PAD (peripheral artery disease) (HCC)   Paroxysmal atrial fibrillation (HCC)   Rheumatoid arthritis (HCC)   Stage 3b chronic kidney disease (CKD) (HCC)   DNR (do not resuscitate)   Elevated LFTs   Anemia   Principal problem Choledocholithiasis-white count is normal, LFTs are normal, there is no signs of cholangitis.  Gastroenterology consulted,  patient underwent ERCP 1/31 with sphincterotomy and stone removal, she tolerated procedure well, recovered well, diet was advanced and is able to tolerate a regular diet.  Given improvement, she will be discharged home in stable condition   Active problems Mild superior endplate deformity of the L1 vertebra -this appears to be new, could potentially explain some of her back pain.  Conservative management, PT consult, home health was arranged.  She responded well to lidocaine patch as well as tramadol, will be prescribed upon discharge  PAF-continue amiodarone, apparently not on anticoagulation due to fall risk.  In sinus rhythm CKD 3B-baseline creatinine 1.3-1.7, creatinine remained at baseline Anemia of chronic disease-hemoglobin stable, no bleeding Chronic wound of the left lower extremity-outpatient follow-up DM 2-continue home regimen  Sepsis ruled out   Discharge Instructions   Allergies as of 04/07/2023       Reactions   Cholestyramine    Possible- Makes throat burn. Patient said she can't take it    Compazine [prochlorperazine Edisylate] Swelling   REACTION: " tongue swell and unable to swallow"   Celecoxib Diarrhea   Sulfonamide Derivatives Other (See Comments)   REACTION: " broke out with fine itching bumps"   Hydrocodone Other (See Comments)   Infliximab Other (See Comments)   Other reaction(s): rash   Leflunomide Other (See Comments)   Other reaction(s): diarrhea   Prochlorperazine Other (See Comments)   Other reaction(s): Unknown Other Reaction(s): tongue swells   Rosuvastatin    Other reaction(s): muscle aches   Sulfa Antibiotics Other (See Comments)  Other reaction(s): tongue swelling        Medication List     TAKE these medications    acetaminophen 325 MG tablet Commonly known as: TYLENOL Take 650 mg by mouth every 6 (six) hours as needed for moderate pain (pain score 4-6).   acetaminophen-codeine 300-30 MG tablet Commonly known as: TYLENOL #3 Take  1 tablet by mouth every 4 (four) hours as needed for moderate pain (pain score 4-6).   amiodarone 200 MG tablet Commonly known as: PACERONE Take 0.5 tablets (100 mg total) by mouth daily.   atorvastatin 20 MG tablet Commonly known as: LIPITOR Take 1 tablet by mouth once daily   Calcium Carbonate-Vitamin D 600-400 MG-UNIT tablet Take 1 tablet by mouth daily at 12 noon.   CENTRUM SILVER 50+WOMEN PO Take 1 tablet by mouth daily.   diclofenac Sodium 1 % Gel Commonly known as: VOLTAREN Apply 4 g topically 4 (four) times daily. What changed:  when to take this reasons to take this   esomeprazole 20 MG capsule Commonly known as: NEXIUM Take 20 mg by mouth daily at 12 noon.   folic acid 1 MG tablet Commonly known as: FOLVITE Take 1 mg by mouth daily.   insulin aspart 100 UNIT/ML FlexPen Commonly known as: NOVOLOG Inject 2 Units into the skin 3 (three) times daily with meals. Sliding scale What changed: how much to take   Insulin Degludec 100 UNIT/ML Soln Commonly known as: Tresiba Inject 4 Units into the skin at bedtime. What changed: how much to take   iron polysaccharides 150 MG capsule Commonly known as: NIFEREX Take 150 mg by mouth daily.   lidocaine 5 % Commonly known as: LIDODERM Place 1 patch onto the skin daily. Remove & Discard patch within 12 hours or as directed by MD   methocarbamol 500 MG tablet Commonly known as: ROBAXIN Take 500 mg by mouth as needed for muscle spasms.   methotrexate 50 MG/2ML injection Inject 0.4 mLs into the muscle once a week.   mirtazapine 7.5 MG tablet Commonly known as: REMERON Take 7.5 mg by mouth at bedtime.   nitroGLYCERIN 0.4 MG SL tablet Commonly known as: NITROSTAT Dissolve 1 tablet under the tongue every 5 minutes as needed for chest pain. Max of 3 doses, then 911.   predniSONE 5 MG tablet Commonly known as: DELTASONE Take 1 tablet (5 mg total) by mouth daily with breakfast.   pregabalin 75 MG capsule Commonly  known as: LYRICA Take 75 mg by mouth at bedtime.   pregabalin 50 MG capsule Commonly known as: LYRICA Take 50 mg by mouth in the morning.   protein supplement shake Liqd Commonly known as: PREMIER PROTEIN Take 2 oz by mouth 2 (two) times daily.   Restasis 0.05 % ophthalmic emulsion Generic drug: cycloSPORINE Place 1 drop into both eyes 2 (two) times daily.   Selsun Blue Dry Scalp 1 % shampoo Generic drug: pyrithione zinc Apply 1 application topically once a week.   traMADol 50 MG tablet Commonly known as: ULTRAM Take 1 tablet (50 mg total) by mouth every 6 (six) hours as needed for severe pain (pain score 7-10). What changed: reasons to take this        Follow-up Information     [facility] Follow up.   Specialty: Home Health Services Why: [facility] to follow up with you at discharge to provide home health services Contact information: [address] [place] [address] [place] [address] [phone]  Adrian Prince, MD Follow up in 1 week(s).   Specialty: Endocrinology Contact information: [address] [place] [address] [phone]                 Consultations: GI  Procedures/Studies:  DG ERCP Result Date: 04/05/2023 CLINICAL DATA:  Choledocholithiasis. EXAM: ERCP TECHNIQUE: Multiple spot images obtained with the fluoroscopic device and submitted for interpretation post-procedure. FLUOROSCOPY: Radiation Exposure Index (as provided by the fluoroscopic device): 41 mGy Kerma COMPARISON:  CT of the abdomen and pelvis on 04/03/2023 FINDINGS: Submitted imaging with a C-arm demonstrates cannulation of the common bile duct with cholangiogram demonstrating mild dilatation of the common bile duct. A balloon sweep maneuver was performed to remove material. There is evidence of prior cholecystectomy. IMPRESSION: ERCP with balloon sweep maneuver performed to remove common bile duct calculi. These images were submitted for radiologic  interpretation only. Please see the procedural report for the amount of contrast and the fluoroscopy time utilized. Electronically Signed   By: Irish Lack M.D.   On: 04/05/2023 16:33   CT ABDOMEN PELVIS W CONTRAST Addendum Date: 04/03/2023 ADDENDUM REPORT: 04/03/2023 17:11 ADDENDUM: These results were called by telephone at the time of interpretation on 04/03/2023 at 5:11 pm to provider Dour Clelia Croft, who verbally acknowledged these results. Electronically Signed   By: Signa Kell M.D.   On: 04/03/2023 17:11   Result Date: 04/03/2023 CLINICAL DATA:  History of pancreatitis. Status post cholecystectomy. Complains of severe lower abdominal pain and distension. EXAM: CT ABDOMEN AND PELVIS WITH CONTRAST TECHNIQUE: Multidetector CT imaging of the abdomen and pelvis was performed using the standard protocol following bolus administration of intravenous contrast. RADIATION DOSE REDUCTION: This exam was performed according to the departmental dose-optimization program which includes automated exposure control, adjustment of the mA and/or kV according to patient size and/or use of iterative reconstruction technique. CONTRAST:  75mL ISOVUE-300 IOPAMIDOL (ISOVUE-300) INJECTION 61% COMPARISON:  MRI/MRCP from 08/10/2022.  CT AP from 08/07/2022. FINDINGS: Lower chest: Scarring noted within the lung bases. No pleural or pericardial effusion. Hepatobiliary: No focal liver abnormality. Status post cholecystectomy. The common bile duct measures 8 mm in diameter which is unchanged compared with the previous exam. No significant intrahepatic bile duct dilatation. Within the distal common bile duct there are 2 gallstones identified which measure up to 5 mm, image 39/4. Pancreas: Unremarkable. No pancreatic ductal dilatation or surrounding inflammatory changes. Spleen: Normal in size without focal abnormality. Adrenals/Urinary Tract: Normal adrenal glands. Bilateral Bosniak class 1 cysts are identified. The largest arises off the  lateral cortex of the right kidney measuring 2.8 cm, image 22/2. No follow-up imaging recommended. Arising off the upper pole of the right kidney is a exophytic cyst which measures 41 Hounsfield units and 1.3 cm. On the previous MRI this was uniformly T1 hyperintense and T2 hypointense compatible with a benign Bosniak class 2 cyst. No follow-up imaging recommended. No nephrolithiasis or obstructive uropathy. Urinary bladder appears normal. Stomach/Bowel: Stomach is normal. No pathologic dilatation of the large or small bowel loops to suggest a bowel obstruction. Fecalized distal small bowel loops are noted. No small bowel wall thickening, inflammation or distension. Mild to moderate stool burden noted throughout the colon. Severe sigmoid diverticulosis. No signs of acute diverticulitis. The appendix is visualized and appears normal. Vascular/Lymphatic: Aortic atherosclerosis without aneurysm. The upper abdominal vascularity appears patent. Reproductive: Uterus and bilateral adnexa are unremarkable. Other: No free fluid or fluid collections. No signs of pneumoperitoneum. Musculoskeletal: Multilevel lumbar spondylosis identified. Anterolisthesis of L4 on L5  measures 5 mm. Mild superior endplate deformity involving the L1 vertebra is identified, new compared with the exam from 08/07/2022. There is mild retropulsion of the fracture fragments by approximately 3 mm. IMPRESSION: 1. Signs of choledocholithiasis status post cholecystectomy. There are 2 stones identified within the distal common bile duct which measure up to 5 mm. 2. Severe sigmoid diverticulosis without signs of acute diverticulitis. 3. Mild superior endplate deformity involving the L1 vertebra is identified, new compared with the exam from 08/07/2022. There is mild retropulsion of the fracture fragments by approximately 3 mm. 4.  Aortic Atherosclerosis (ICD10-I70.0). Electronically Signed: By: Signa Kell M.D. On: 04/03/2023 17:07     Subjective: -  no chest pain, shortness of breath, no abdominal pain, nausea or vomiting.   Discharge Exam: BP (!) 120/53 (BP Location: Left Arm)   Pulse (!) 58   Temp 97.9 F (36.6 C)   Resp 18   Ht 5\' 4"  (1.626 m)   Wt 79.8 kg   SpO2 100%   BMI 30.21 kg/m   General: Pt is alert, awake, not in acute distress Cardiovascular: RRR, S1/S2 +, no rubs, no gallops Respiratory: CTA bilaterally, no wheezing, no rhonchi Abdominal: Soft, NT, ND, bowel sounds + Extremities: no edema, no cyanosis    The results of significant diagnostics from this hospitalization (including imaging, microbiology, ancillary and laboratory) are listed below for reference.     Microbiology: No results found for this or any previous visit (from the past 240 hours).   Labs: Basic Metabolic Panel: Recent Labs  Lab 04/03/23 1823 04/04/23 0414 04/05/23 0520  NA 137 142 138  K 5.7* 4.5 4.1  CL 102 107 106  CO2 26 26 22   GLUCOSE 310* 78 256*  BUN 63* 55* 46*  CREATININE 1.51* 1.34* 1.23*  CALCIUM 8.6* 8.7* 7.6*  MG  --   --  2.4  PHOS  --   --  4.1   Liver Function Tests: Recent Labs  Lab 04/03/23 1823 04/04/23 0414 04/05/23 0520  AST 26 23 39  ALT 59* 53* 75*  ALKPHOS 77 72 85  BILITOT 0.6 0.9 1.2  PROT 6.2* 6.2* 5.2*  ALBUMIN 3.3* 3.4* 2.8*   CBC: Recent Labs  Lab 04/03/23 1823 04/04/23 0414 04/05/23 0520  WBC 11.4* 9.9 10.8*  NEUTROABS 9.5* 6.8  --   HGB 11.3* 11.7* 10.1*  HCT 36.1 37.8 32.1*  MCV 97.6 98.2 97.0  PLT 227 209 177   CBG: Recent Labs  Lab 04/06/23 1156 04/06/23 1647 04/06/23 2216 04/07/23 0734 04/07/23 1128  GLUCAP 251* 340* 260* 304* 220*   Hgb A1c No results for input(s): "HGBA1C" in the last 72 hours. Lipid Profile No results for input(s): "CHOL", "HDL", "LDLCALC", "TRIG", "CHOLHDL", "LDLDIRECT" in the last 72 hours. Thyroid function studies No results for input(s): "TSH", "T4TOTAL", "T3FREE", "THYROIDAB" in the last 72 hours.  Invalid input(s):  "FREET3" Urinalysis    Component Value Date/Time   COLORURINE YELLOW 02/04/2022 0730   APPEARANCEUR CLOUDY (A) 02/04/2022 0730   LABSPEC 1.008 02/04/2022 0730   PHURINE 7.0 02/04/2022 0730   GLUCOSEU 50 (A) 02/04/2022 0730   HGBUR SMALL (A) 02/04/2022 0730   BILIRUBINUR NEGATIVE 02/04/2022 0730   KETONESUR NEGATIVE 02/04/2022 0730   PROTEINUR 30 (A) 02/04/2022 0730   NITRITE NEGATIVE 02/04/2022 0730   LEUKOCYTESUR LARGE (A) 02/04/2022 0730    FURTHER DISCHARGE INSTRUCTIONS:   Get Medicines reviewed and adjusted: Please take all your medications with you for your next visit with  your Primary MD   Laboratory/radiological data: Please request your Primary MD to go over all hospital tests and procedure/radiological results at the follow up, please ask your Primary MD to get all Hospital records sent to his/her office.   In some cases, they will be blood work, cultures and biopsy results pending at the time of your discharge. Please request that your primary care M.D. goes through all the records of your hospital data and follows up on these results.   Also Note the following: If you experience worsening of your admission symptoms, develop shortness of breath, life threatening emergency, suicidal or homicidal thoughts you must seek medical attention immediately by calling 911 or calling your MD immediately  if symptoms less severe.   You must read complete instructions/literature along with all the possible adverse reactions/side effects for all the Medicines you take and that have been prescribed to you. Take any new Medicines after you have completely understood and accpet all the possible adverse reactions/side effects.    Do not drive when taking Pain medications or sleeping medications (Benzodaizepines)   Do not take more than prescribed Pain, Sleep and Anxiety Medications. It is not advisable to combine anxiety,sleep and pain medications without talking with your primary care  practitioner   Special Instructions: If you have smoked or chewed Tobacco  in the last 2 yrs please stop smoking, stop any regular Alcohol  and or any Recreational drug use.   Wear Seat belts while driving.   Please note: You were cared for by a hospitalist during your hospital stay. Once you are discharged, your primary care physician will handle any further medical issues. Please note that NO REFILLS for any discharge medications will be authorized once you are discharged, as it is imperative that you return to your primary care physician (or establish a relationship with a primary care physician if you do not have one) for your post hospital discharge needs so that they can reassess your need for medications and monitor your lab values.  Time coordinating discharge: 35 minutes  SIGNED:  Pamella Pert, MD, PhD 04/07/2023, 1:40 PM

## 2023-04-07 NOTE — Progress Notes (Signed)
Physical Therapy Treatment Patient Details Name: Shelby Walker MRN: [id number] DOB: 1933/12/19 Today's Date: 04/07/2023   History of Present Illness Pt is 89 yo female who presented on 04/03/23 with abdominal and back pain for past week.  She was found to have choledocholithiasis and mild L1 endplate deformity to be managed conservatively. Pt with hx including but not limited to DM2, PAF, HLD, CKD, RA, acute biliary pancreatitis 6/24, s/p postcholecystectomy    PT Comments  Pt progressing this session, reports incr back pain with mobility. Discussed trying Tramadol during day with pt and family, notified RN--family reports back pain has been more of a limiting factor, even prior to admission. Pt is amb 130', overall CGA to supervision  this session; pt does demonstrate higher level balance deficits placing pt at risk for falls, pt admittedly furniture walks at home if not using RW.  Pt would benefit from HHPT/HHOT and additional assist from family or aide at least initially. Pt son and dtr present for PT session, very supportive. Continue PT in acute setting   If plan is discharge home, recommend the following: A little help with walking and/or transfers;A little help with bathing/dressing/bathroom;Assistance with cooking/housework;Help with stairs or ramp for entrance   Can travel by private vehicle        Equipment Recommendations  None recommended by PT    Recommendations for Other Services       Precautions / Restrictions Precautions Precautions: Fall;Back Precaution Comments: Back for comfort - hadnout given on eval; no brace needed; reviewed with pt and family Restrictions Weight Bearing Restrictions Per Provider Order: No     Mobility  Bed Mobility Overal bed mobility: Needs Assistance Bed Mobility: Rolling, Sidelying to Sit, Sit to Sidelying Rolling: Supervision Sidelying to sit: Supervision     Sit to sidelying: Contact guard assist, Supervision General bed mobility  comments: Education on  log roll technique; repeated x2 for  reinforcement; supervision on second trial    Transfers Overall transfer level: Needs assistance Equipment used: Rolling walker (2 wheels) Transfers: Sit to/from Stand, Bed to chair/wheelchair/BSC Sit to Stand: Contact guard assist, Supervision   Step pivot transfers: Supervision, Contact guard assist       General transfer comment: from bed and recliner; cues for safety and hand placement; CGA to supervision for safety    Ambulation/Gait Ambulation/Gait assistance: Supervision, Contact guard assist Gait Distance (Feet): 130 Feet Assistive device: Rolling walker (2 wheels) Gait Pattern/deviations: Step-through pattern, Decreased stride length (slight trendelenberg gait) Gait velocity: decreased but functional     General Gait Details: cues for RW proximity   Stairs             Wheelchair Mobility     Tilt Bed    Modified Rankin (Stroke Patients Only)       Balance Overall balance assessment: Needs assistance Sitting-balance support: No upper extremity supported Sitting balance-Leahy Scale: Good     Standing balance support: No upper extremity supported, Bilateral upper extremity supported Standing balance-Leahy Scale: Fair Standing balance comment: RW to ambulate but able to stand without UE support; unsteady without at least unilateral UE support for dynamic tasks                            Cognition Arousal: Alert Behavior During Therapy: [facility] for tasks assessed/performed Overall Cognitive Status: Within Functional Limits for tasks assessed  General Comments: pt son and dtr present for session        Exercises      General Comments        Pertinent Vitals/Pain Pain Assessment Pain Assessment: Faces Faces Pain Scale: Hurts even more Pain Location: back Pain Descriptors / Indicators: Discomfort Pain Intervention(s): Limited  activity within patient's tolerance, Monitored during session, Premedicated before session, Repositioned    Home Living                          Prior Function            PT Goals (current goals can now be found in the care plan section) Acute Rehab PT Goals Patient Stated Goal: return home PT Goal Formulation: With patient Time For Goal Achievement: 04/19/23 Potential to Achieve Goals: Good Progress towards PT goals: Progressing toward goals    Frequency    Min 1X/week      PT Plan      Co-evaluation              AM-PAC PT "6 Clicks" Mobility   Outcome Measure  Help needed turning from your back to your side while in a flat bed without using bedrails?: None Help needed moving from lying on your back to sitting on the side of a flat bed without using bedrails?: None Help needed moving to and from a bed to a chair (including a wheelchair)?: A Little Help needed standing up from a chair using your arms (e.g., wheelchair or bedside chair)?: A Little Help needed to walk in hospital room?: A Little Help needed climbing 3-5 steps with a railing? : A Little 6 Click Score: 20    End of Session Equipment Utilized During Treatment: Gait belt Activity Tolerance: Patient tolerated treatment well Patient left: in chair;with call bell/phone within reach;with family/visitor present Nurse Communication: Mobility status PT Visit Diagnosis: Other abnormalities of gait and mobility (R26.89);Muscle weakness (generalized) (M62.81);Pain Pain - part of body:  (back)     Time: 1100-1131 PT Time Calculation (min) (ACUTE ONLY): 31 min  Charges:    $Gait Training: 23-37 mins PT General Charges $$ ACUTE PT VISIT: 1 Visit                     Yvette Loveless, PT  Acute Rehab Dept [facility]) [phone]  04/07/2023    [facility] 04/07/2023, 11:45 AM

## 2023-04-07 NOTE — Evaluation (Signed)
Occupational Therapy Evaluation Patient Details Name: Shelby Walker MRN: [id number] DOB: 19-Aug-1933 Today's Date: 04/07/2023   History of Present Illness Pt is 89 yo female who presented on 04/03/23 with abdominal and back pain for past week.  She was found to have choledocholithiasis and mild L1 endplate deformity to be managed conservatively. Pt with hx including but not limited to DM2, PAF, HLD, CKD, RA, acute biliary pancreatitis 6/24, s/p postcholecystectomy   Clinical Impression   Patient is a 89 year old female who was admitted for above. Patient was supervision for bed mobility CGA for toileting tasks, LB dressing and self feeding. Patients family was educated on need for cues for safety and importance of avoiding learned helplessness at same time. Patients family verbalized understanding. Patient plans to d/c home with family support and [name] services when medically stable. Patient was noted to have decreased functional activity tolerance, decreased endurance, decreased standing balance, decreased safety awareness, and decreased knowledge of AD/AE impacting participation in ADLs. Patient would continue to benefit from skilled OT services at this time while admitted and after d/c to address noted deficits in order to improve overall safety and independence in ADLs.        If plan is discharge home, recommend the following: A little help with walking and/or transfers;A little help with bathing/dressing/bathroom;Supervision due to cognitive status;Assist for transportation;Direct supervision/assist for medications management;Assistance with cooking/housework;Direct supervision/assist for financial management;Help with stairs or ramp for entrance    Functional Status Assessment  Patient has had a recent decline in their functional status and demonstrates the ability to make significant improvements in function in a reasonable and predictable amount of time.  Equipment Recommendations  None  recommended by OT       Precautions / Restrictions Precautions Precautions: Fall;Back Precaution Booklet Issued: Yes (comment) Precaution Comments: Back for comfort Restrictions Weight Bearing Restrictions Per Provider Order: No      Mobility Bed Mobility     Rolling: Supervision Sidelying to sit: Supervision                              Balance Overall balance assessment: Needs assistance Sitting-balance support: No upper extremity supported Sitting balance-Leahy Scale: Good     Standing balance support: No upper extremity supported, Bilateral upper extremity supported Standing balance-Leahy Scale: Fair             ADL either performed or assessed with clinical judgement   ADL       General ADL Comments: patient was able to figure four bilaterally sitting EOB to don/doff socks. patient and family educated on bakc precautions during ADLs and using reacher to collect things fallen onto floor. patient and children verbalized understanding. patietn was able to simulate toileting tasks with supervision with RW with cues to keep close and education to keep one hand on walker to prevent LOB patient was able to regain balance with supervision during session. patients children made aware of 24/7 caregiver recommendation with [name] services. patietns children verbalized understanding. children asked how long recommendation would be needed. explained HH can help with further asssessment of patient in her environment.     Vision   Vision Assessment?: No apparent visual deficits            Pertinent Vitals/Pain Pain Assessment Pain Assessment: Faces Faces Pain Scale: Hurts little more Pain Location: back Pain Descriptors / Indicators: Discomfort Pain Intervention(s): Limited activity within patient's tolerance, Monitored during session, Premedicated  before session, Repositioned     Extremity/Trunk Assessment Upper Extremity Assessment Upper Extremity  Assessment:  (noted to have severe signs of RA in bilateral hands but able to ROM [name])   Lower Extremity Assessment Lower Extremity Assessment: Defer to PT evaluation   Cervical / Trunk Assessment Cervical / Trunk Assessment: Kyphotic;Other exceptions Cervical / Trunk Exceptions: Mild L1 fx - manage conservatively per note   Communication Communication Communication: Hearing impairment   Cognition Arousal: Alert Behavior During Therapy: WFL for tasks assessed/performed Overall Cognitive Status: Within Functional Limits for tasks assessed         General Comments: patient was plesant with some memory deficits during session. patient reporetd it was tuesday 2025 when asked. patient not carryingover education from PT session about 1 hour before OT session. patient is plesant and cooperative. patients daughter and son are present during session.                Home Living Family/patient expects to be discharged to:: Private residence Living Arrangements: Alone Available Help at Discharge: Family;Available PRN/intermittently;Personal care attendant Type of Home: House Home Access: Stairs to enter [employer] of Steps: 2 Entrance Stairs-Rails: Left Home Layout: One level;Laundry or work area in Engineer, site of Steps: chair lift to basement   [employer] Shower/Tub: Arts development officer Toilet: Handicapped height     Home Equipment: Agricultural consultant (2 wheels);Shower seat;Grab bars - tub/shower;Hand held Stage manager (4 wheels);BSC/3in1;Other (comment);Lift chair   Additional Comments: family lives a few houses down and are available to assist if needed. Pt has stair lift to the basement if she needs to access it      Prior Functioning/Environment Prior Level of Function : Needs assist             Mobility Comments: Ambulates with RW; mainly short community ambulation; denies falls ADLs Comments: Pt independent  with ADLs; Family assist with IADLs/meals; Could fix easy meals; family assist with transportation        OT Problem List: Impaired balance (sitting and/or standing);Decreased safety awareness;Decreased knowledge of use of DME or AE;Decreased activity tolerance      OT Treatment/Interventions: Self-care/ADL training;DME and/or AE instruction;Therapeutic activities;Balance training;Therapeutic exercise;Patient/family education    OT Goals(Current goals can be found in the care plan section) Acute Rehab OT Goals Patient Stated Goal: to get better OT Goal Formulation: With patient/family Time For Goal Achievement: 04/21/23 Potential to Achieve Goals: Fair  OT Frequency: Min 1X/week       AM-PAC OT "6 Clicks" Daily Activity     Outcome Measure Help from another person eating meals?: None Help from another person taking care of personal grooming?: A Little Help from another person toileting, which includes using toliet, bedpan, or urinal?: A Little Help from another person bathing (including washing, rinsing, drying)?: A Little Help from another person to put on and taking off regular upper body clothing?: A Little Help from another person to put on and taking off regular lower body clothing?: A Little 6 Click Score: 19   End of Session Equipment Utilized During Treatment: Gait belt;Rolling walker (2 wheels) Nurse Communication: Mobility status  Activity Tolerance: Patient tolerated treatment well Patient left: in chair;with call bell/phone within reach;with family/visitor present  OT Visit Diagnosis: Unsteadiness on feet (R26.81);Other abnormalities of gait and mobility (R26.89);Pain                Time: 1300-1326 OT Time Calculation (min): 26 min Charges:  OT General Charges $OT  Visit: 1 Visit OT Evaluation $OT Eval Low Complexity: 1 Low OT Treatments $Self Care/Home Management : 8-22 mins  Rosalio Loud, MS Acute Rehabilitation Department Office# [phone]   Selinda Flavin 04/07/2023, 2:06 PM

## 2023-04-07 NOTE — Plan of Care (Signed)

## 2023-04-07 NOTE — TOC Transition Note (Signed)
Transition of Care [facility]) - Discharge Note   Patient Details  Name: Shelby Walker MRN: [id number] Date of Birth: 1933/11/24  Transition of Care [facility]) CM/SW Contact:  Adrian Prows, RN Phone Number: 04/07/2023, 2:01 PM   Clinical Narrative:    Orders received for HHPT/OT/RN/Aide; spoke w/ pt and family in room; they agree to recc services; HHPT/OT previously arranged w/ [facility]bour; notified Tia Masker at agency that additional services added; she says agency can provide services; family also requested resource for private duty sitters; list provided; they will contact agency of choice; [facility] contact info placed in follow up provider section of d/c instructions; no TOC needs.   Final next level of care: Home w Home Health Services Barriers to Discharge: No Barriers Identified   Patient Goals and CMS Choice Patient states their goals for this hospitalization and ongoing recovery are:: To return home          Discharge Placement                       Discharge Plan and Services Additional resources added to the After Visit Summary for   In-house Referral: Clinical Social Work Discharge Planning Services: NA Post Acute Care Choice: Home Health, Resumption of Svcs/PTA Provider          DME Arranged: N/A DME Agency: NA       HH Arranged: RN, PT, OT, Nurse's Aide HH Agency: [facility] Date HH Agency Contacted: 04/07/23 Time HH Agency Contacted: 1401 Representative spoke with at [facility] Agency: Tia Masker  Social Drivers of Health (SDOH) Interventions SDOH Screenings   Food Insecurity: No Food Insecurity (04/04/2023)  Housing: Low Risk  (04/04/2023)  Transportation Needs: No Transportation Needs (04/04/2023)  Utilities: Not At Risk (04/04/2023)  Depression (PHQ2-9): Low Risk  (05/28/2022)  Financial Resource Strain: Low Risk  (07/24/2018)  Physical Activity: Sufficiently Active (07/24/2018)  Social Connections: Socially Integrated (04/04/2023)  Stress: No  Stress Concern Present (07/24/2018)  Tobacco Use: Low Risk  (04/05/2023)     Readmission Risk Interventions    04/05/2023    2:29 PM 02/02/2022    1:25 PM  Readmission Risk Prevention Plan  Transportation Screening Complete   PCP or Specialist Appt within 5-7 Days Complete   Home Care Screening Complete   Medication Review (RN CM) Complete   HRI or Home Care Consult  Complete  Social Work Consult for Recovery Care Planning/Counseling  Complete  Palliative Care Screening  Not Applicable  Medication Review Oceanographer)  Complete

## 2023-04-08 ENCOUNTER — Ambulatory Visit (INDEPENDENT_AMBULATORY_CARE_PROVIDER_SITE_OTHER): Payer: Medicare Other

## 2023-04-08 DIAGNOSIS — I4819 Other persistent atrial fibrillation: Secondary | ICD-10-CM | POA: Diagnosis not present

## 2023-04-08 LAB — CUP PACEART REMOTE DEVICE CHECK
Date Time Interrogation Session: 20250202232121
Implantable Pulse Generator Implant Date: 20240708

## 2023-04-09 DIAGNOSIS — S81802D Unspecified open wound, left lower leg, subsequent encounter: Secondary | ICD-10-CM | POA: Diagnosis not present

## 2023-04-09 DIAGNOSIS — M170 Bilateral primary osteoarthritis of knee: Secondary | ICD-10-CM | POA: Diagnosis not present

## 2023-04-09 DIAGNOSIS — E1122 Type 2 diabetes mellitus with diabetic chronic kidney disease: Secondary | ICD-10-CM | POA: Diagnosis not present

## 2023-04-09 DIAGNOSIS — I509 Heart failure, unspecified: Secondary | ICD-10-CM | POA: Diagnosis not present

## 2023-04-09 DIAGNOSIS — I130 Hypertensive heart and chronic kidney disease with heart failure and stage 1 through stage 4 chronic kidney disease, or unspecified chronic kidney disease: Secondary | ICD-10-CM | POA: Diagnosis not present

## 2023-04-09 DIAGNOSIS — N1832 Chronic kidney disease, stage 3b: Secondary | ICD-10-CM | POA: Diagnosis not present

## 2023-04-12 ENCOUNTER — Encounter: Payer: Self-pay | Admitting: Orthopedic Surgery

## 2023-04-12 ENCOUNTER — Telehealth: Payer: Self-pay | Admitting: Orthopedic Surgery

## 2023-04-12 DIAGNOSIS — E1142 Type 2 diabetes mellitus with diabetic polyneuropathy: Secondary | ICD-10-CM | POA: Diagnosis not present

## 2023-04-12 DIAGNOSIS — E785 Hyperlipidemia, unspecified: Secondary | ICD-10-CM | POA: Diagnosis not present

## 2023-04-12 DIAGNOSIS — I872 Venous insufficiency (chronic) (peripheral): Secondary | ICD-10-CM | POA: Diagnosis not present

## 2023-04-12 DIAGNOSIS — H9193 Unspecified hearing loss, bilateral: Secondary | ICD-10-CM | POA: Diagnosis not present

## 2023-04-12 DIAGNOSIS — I130 Hypertensive heart and chronic kidney disease with heart failure and stage 1 through stage 4 chronic kidney disease, or unspecified chronic kidney disease: Secondary | ICD-10-CM | POA: Diagnosis not present

## 2023-04-12 DIAGNOSIS — M069 Rheumatoid arthritis, unspecified: Secondary | ICD-10-CM | POA: Diagnosis not present

## 2023-04-12 DIAGNOSIS — I051 Rheumatic mitral insufficiency: Secondary | ICD-10-CM | POA: Diagnosis not present

## 2023-04-12 DIAGNOSIS — M170 Bilateral primary osteoarthritis of knee: Secondary | ICD-10-CM | POA: Diagnosis not present

## 2023-04-12 DIAGNOSIS — N281 Cyst of kidney, acquired: Secondary | ICD-10-CM | POA: Diagnosis not present

## 2023-04-12 DIAGNOSIS — K219 Gastro-esophageal reflux disease without esophagitis: Secondary | ICD-10-CM | POA: Diagnosis not present

## 2023-04-12 DIAGNOSIS — N1832 Chronic kidney disease, stage 3b: Secondary | ICD-10-CM | POA: Diagnosis not present

## 2023-04-12 DIAGNOSIS — E1122 Type 2 diabetes mellitus with diabetic chronic kidney disease: Secondary | ICD-10-CM | POA: Diagnosis not present

## 2023-04-12 DIAGNOSIS — K5730 Diverticulosis of large intestine without perforation or abscess without bleeding: Secondary | ICD-10-CM | POA: Diagnosis not present

## 2023-04-12 DIAGNOSIS — M810 Age-related osteoporosis without current pathological fracture: Secondary | ICD-10-CM | POA: Diagnosis not present

## 2023-04-12 DIAGNOSIS — D631 Anemia in chronic kidney disease: Secondary | ICD-10-CM | POA: Diagnosis not present

## 2023-04-12 DIAGNOSIS — E1151 Type 2 diabetes mellitus with diabetic peripheral angiopathy without gangrene: Secondary | ICD-10-CM | POA: Diagnosis not present

## 2023-04-12 DIAGNOSIS — I2510 Atherosclerotic heart disease of native coronary artery without angina pectoris: Secondary | ICD-10-CM | POA: Diagnosis not present

## 2023-04-12 DIAGNOSIS — I700 Atherosclerosis of aorta: Secondary | ICD-10-CM | POA: Diagnosis not present

## 2023-04-12 DIAGNOSIS — S32000A Wedge compression fracture of unspecified lumbar vertebra, initial encounter for closed fracture: Secondary | ICD-10-CM | POA: Diagnosis not present

## 2023-04-12 DIAGNOSIS — Z48815 Encounter for surgical aftercare following surgery on the digestive system: Secondary | ICD-10-CM | POA: Diagnosis not present

## 2023-04-12 DIAGNOSIS — I480 Paroxysmal atrial fibrillation: Secondary | ICD-10-CM | POA: Diagnosis not present

## 2023-04-12 DIAGNOSIS — E875 Hyperkalemia: Secondary | ICD-10-CM | POA: Diagnosis not present

## 2023-04-12 DIAGNOSIS — I890 Lymphedema, not elsewhere classified: Secondary | ICD-10-CM | POA: Diagnosis not present

## 2023-04-12 DIAGNOSIS — I509 Heart failure, unspecified: Secondary | ICD-10-CM | POA: Diagnosis not present

## 2023-04-12 DIAGNOSIS — M1990 Unspecified osteoarthritis, unspecified site: Secondary | ICD-10-CM | POA: Diagnosis not present

## 2023-04-12 DIAGNOSIS — I050 Rheumatic mitral stenosis: Secondary | ICD-10-CM | POA: Diagnosis not present

## 2023-04-12 NOTE — Telephone Encounter (Signed)
 Please look at the mycahrt message that was sent by pt's daughter. There is a photo of a small open area medial side foot and c/o pain with spot amount of brownish drain. It is very painful. The pt has an appt on Monday but daughter is not sure she will be able to bring her due to the discomfort and wants to know what she should do over the weekend to treat. Please advise.

## 2023-04-12 NOTE — Telephone Encounter (Signed)
 Patient daughter Edwina Gram called concerned about her mothers left foot. She advised its draining fluids. She requested for a call back please. [phone]

## 2023-04-12 NOTE — Telephone Encounter (Signed)
 Mychart message sent to erin will hold pending advisement.

## 2023-04-15 ENCOUNTER — Ambulatory Visit (INDEPENDENT_AMBULATORY_CARE_PROVIDER_SITE_OTHER): Payer: Medicare Other | Admitting: Orthopedic Surgery

## 2023-04-15 DIAGNOSIS — I87332 Chronic venous hypertension (idiopathic) with ulcer and inflammation of left lower extremity: Secondary | ICD-10-CM

## 2023-04-15 DIAGNOSIS — S81802D Unspecified open wound, left lower leg, subsequent encounter: Secondary | ICD-10-CM

## 2023-04-15 MED ORDER — NITROGLYCERIN 0.2 MG/HR TD PT24: 0.2000 mg | MEDICATED_PATCH | Freq: Every day | TRANSDERMAL | 12 refills | Status: DC

## 2023-04-16 ENCOUNTER — Ambulatory Visit: Payer: Medicare Other | Admitting: Podiatry

## 2023-04-16 ENCOUNTER — Encounter: Payer: Self-pay | Admitting: Orthopedic Surgery

## 2023-04-16 DIAGNOSIS — I130 Hypertensive heart and chronic kidney disease with heart failure and stage 1 through stage 4 chronic kidney disease, or unspecified chronic kidney disease: Secondary | ICD-10-CM | POA: Diagnosis not present

## 2023-04-16 DIAGNOSIS — Z48815 Encounter for surgical aftercare following surgery on the digestive system: Secondary | ICD-10-CM | POA: Diagnosis not present

## 2023-04-16 DIAGNOSIS — M545 Low back pain, unspecified: Secondary | ICD-10-CM | POA: Diagnosis not present

## 2023-04-16 DIAGNOSIS — M170 Bilateral primary osteoarthritis of knee: Secondary | ICD-10-CM | POA: Diagnosis not present

## 2023-04-16 DIAGNOSIS — N1832 Chronic kidney disease, stage 3b: Secondary | ICD-10-CM | POA: Diagnosis not present

## 2023-04-16 DIAGNOSIS — I509 Heart failure, unspecified: Secondary | ICD-10-CM | POA: Diagnosis not present

## 2023-04-16 DIAGNOSIS — E1122 Type 2 diabetes mellitus with diabetic chronic kidney disease: Secondary | ICD-10-CM | POA: Diagnosis not present

## 2023-04-17 ENCOUNTER — Encounter (HOSPITAL_BASED_OUTPATIENT_CLINIC_OR_DEPARTMENT_OTHER): Payer: Medicare Other | Admitting: General Surgery

## 2023-04-19 DIAGNOSIS — I509 Heart failure, unspecified: Secondary | ICD-10-CM | POA: Diagnosis not present

## 2023-04-19 DIAGNOSIS — M170 Bilateral primary osteoarthritis of knee: Secondary | ICD-10-CM | POA: Diagnosis not present

## 2023-04-19 DIAGNOSIS — E1122 Type 2 diabetes mellitus with diabetic chronic kidney disease: Secondary | ICD-10-CM | POA: Diagnosis not present

## 2023-04-19 DIAGNOSIS — Z48815 Encounter for surgical aftercare following surgery on the digestive system: Secondary | ICD-10-CM | POA: Diagnosis not present

## 2023-04-19 DIAGNOSIS — I130 Hypertensive heart and chronic kidney disease with heart failure and stage 1 through stage 4 chronic kidney disease, or unspecified chronic kidney disease: Secondary | ICD-10-CM | POA: Diagnosis not present

## 2023-04-19 DIAGNOSIS — N1832 Chronic kidney disease, stage 3b: Secondary | ICD-10-CM | POA: Diagnosis not present

## 2023-04-22 ENCOUNTER — Other Ambulatory Visit: Payer: Self-pay | Admitting: Orthopedic Surgery

## 2023-04-22 ENCOUNTER — Telehealth: Payer: Self-pay | Admitting: *Deleted

## 2023-04-22 NOTE — Telephone Encounter (Signed)
   Name: Shelby Walker  DOB: 11-Mar-1933  MRN: [id number]   Primary Cardiologist: Kristeen Miss, MD  Chart reviewed as part of pre-operative protocol coverage. JAIME DOME was last seen on 03/11/2023 by Dr. Graciela Husbands.  She was doing well with well controlled BP and tolerating amiodarone.  Spoke with patient via telephone with her daughter, [name], present. Patient is doing well. She denies new or concerning cardiac symptoms. Her activity has been somewhat limited secondary to back pain. She is independent with all ADLs and light activities around her home. She ambulates with a walker.   Therefore, based on ACC/AHA guidelines, the patient would be an acceptable risk for the planned procedure without further cardiovascular testing.   I will route this recommendation to the requesting party via Epic fax function and remove from pre-op pool. Please call with questions.  Carlos Levering, NP 04/22/2023, 11:20 AM

## 2023-04-22 NOTE — Telephone Encounter (Signed)
   Pre-operative Risk Assessment    Patient Name: Shelby Walker  DOB: 04-18-1933 MRN: [id number]   Date of last office visit: 03/11/23 DR. KLEIN Date of next office visit: NONE   Request for Surgical Clearance    Procedure:   LUMBAR KYPHOPLASTY  Date of Surgery:  Clearance 04/25/23 STAT                                Surgeon:  DR. Yevette Edwards Surgeon's Group or Practice Name:  [facility] Phone number:  [phone] [facility] Fax number:  [phone]   Type of Clearance Requested:   - Medical ; NO MEDICATIONS INDICATED ON FORM TO BE HELD   Type of Anesthesia:  Not Indicated   Additional requests/questions:    Elpidio Anis   04/22/2023, 10:33 AM

## 2023-04-23 ENCOUNTER — Encounter: Payer: Self-pay | Admitting: Orthopedic Surgery

## 2023-04-23 ENCOUNTER — Encounter (HOSPITAL_COMMUNITY): Payer: Self-pay | Admitting: Orthopedic Surgery

## 2023-04-23 DIAGNOSIS — I130 Hypertensive heart and chronic kidney disease with heart failure and stage 1 through stage 4 chronic kidney disease, or unspecified chronic kidney disease: Secondary | ICD-10-CM | POA: Diagnosis not present

## 2023-04-23 DIAGNOSIS — S81802D Unspecified open wound, left lower leg, subsequent encounter: Secondary | ICD-10-CM | POA: Diagnosis not present

## 2023-04-23 DIAGNOSIS — Z89431 Acquired absence of right foot: Secondary | ICD-10-CM | POA: Diagnosis not present

## 2023-04-23 DIAGNOSIS — I739 Peripheral vascular disease, unspecified: Secondary | ICD-10-CM | POA: Diagnosis not present

## 2023-04-23 DIAGNOSIS — M170 Bilateral primary osteoarthritis of knee: Secondary | ICD-10-CM | POA: Diagnosis not present

## 2023-04-23 DIAGNOSIS — E1122 Type 2 diabetes mellitus with diabetic chronic kidney disease: Secondary | ICD-10-CM | POA: Diagnosis not present

## 2023-04-23 DIAGNOSIS — M069 Rheumatoid arthritis, unspecified: Secondary | ICD-10-CM | POA: Diagnosis not present

## 2023-04-23 DIAGNOSIS — E1140 Type 2 diabetes mellitus with diabetic neuropathy, unspecified: Secondary | ICD-10-CM | POA: Diagnosis not present

## 2023-04-23 DIAGNOSIS — I480 Paroxysmal atrial fibrillation: Secondary | ICD-10-CM | POA: Diagnosis not present

## 2023-04-23 DIAGNOSIS — S32010G Wedge compression fracture of first lumbar vertebra, subsequent encounter for fracture with delayed healing: Secondary | ICD-10-CM | POA: Diagnosis not present

## 2023-04-23 DIAGNOSIS — Z48815 Encounter for surgical aftercare following surgery on the digestive system: Secondary | ICD-10-CM | POA: Diagnosis not present

## 2023-04-23 DIAGNOSIS — N1832 Chronic kidney disease, stage 3b: Secondary | ICD-10-CM | POA: Diagnosis not present

## 2023-04-23 DIAGNOSIS — I509 Heart failure, unspecified: Secondary | ICD-10-CM | POA: Diagnosis not present

## 2023-04-23 DIAGNOSIS — I700 Atherosclerosis of aorta: Secondary | ICD-10-CM | POA: Diagnosis not present

## 2023-04-23 DIAGNOSIS — Z9049 Acquired absence of other specified parts of digestive tract: Secondary | ICD-10-CM | POA: Diagnosis not present

## 2023-04-23 DIAGNOSIS — Z794 Long term (current) use of insulin: Secondary | ICD-10-CM | POA: Diagnosis not present

## 2023-04-23 NOTE — Progress Notes (Signed)
Office Visit Note   Patient: Shelby Walker           Date of Birth: 1933-08-17           MRN: [id number] Visit Date: 04/15/2023              Requested by: Adrian Prince, MD [address] [place],  [place] [address] PCP: Adrian Prince, MD  Chief Complaint  Patient presents with   Left Leg - Pain, Follow-up      HPI: Patient is an 89 year old woman who is seen in follow-up for venous stasis changes and ulceration left lower extremity.  Patient states she has developed an ulcer on her left great toe.  Patient has a history of diabetes and rheumatoid arthritis.  Assessment & Plan: Visit Diagnoses:  1. Wound of left lower extremity, subsequent encounter   2. Chronic venous hypertension (idiopathic) with ulcer and inflammation of left lower extremity (HCC)     Plan: Will start her on a nitroglycerin patch to be changed daily to help with microcirculation to the great toe.  Follow-Up Instructions: Return in about 4 weeks (around 05/13/2023).   Ortho Exam  Patient is alert, oriented, no adenopathy, well-dressed, normal affect, normal respiratory effort. Examination patient has a small ulcer on the medial aspect of the left great toe at the MTP joint.  She does not have palpable pulses.  Ankle-brachial indices shows monophasic flow.  With the Doppler she has monophasic flow.  Review of the most recent ankle-brachial indices shows elevated pressures at the ankle and great toe with monophasic flow.  Imaging: No results found.   Labs: Lab Results  Component Value Date   HGBA1C 9.2 (H) 04/04/2023   HGBA1C 9.2 (H) 08/08/2022   HGBA1C 7.3 (H) 01/24/2022   ESRSEDRATE 31 (H) 05/01/2022   ESRSEDRATE 22 03/27/2022   ESRSEDRATE 18 02/09/2022   CRP 8.0 (H) 08/12/2022   CRP 8.1 (H) 08/11/2022   CRP 8.5 (H) 08/10/2022   REPTSTATUS 08/10/2022 FINAL 08/07/2022   CULT (A) 08/07/2022    ENTEROBACTER CLOACAE SUSCEPTIBILITIES PERFORMED ON PREVIOUS CULTURE WITHIN THE LAST 5 DAYS. Performed  at [facility], [address], [place], [place] [address]    LABORGA ENTEROBACTER CLOACAE 08/07/2022     Lab Results  Component Value Date   ALBUMIN 2.8 (L) 04/05/2023   ALBUMIN 3.4 (L) 04/04/2023   ALBUMIN 3.3 (L) 04/03/2023   PREALBUMIN 18 02/05/2022   PREALBUMIN 15.2 (L) 01/16/2021    Lab Results  Component Value Date   MG 2.4 04/05/2023   MG 2.3 08/12/2022   MG 2.3 08/11/2022   No results found for: "VD25OH"  Lab Results  Component Value Date   PREALBUMIN 18 02/05/2022   PREALBUMIN 15.2 (L) 01/16/2021      Latest Ref Rng & Units 04/05/2023    5:20 AM 04/04/2023    4:14 AM 04/03/2023    6:23 PM  CBC EXTENDED  WBC 4.0 - 10.5 K/uL 10.8  9.9  11.4   RBC 3.87 - 5.11 MIL/uL 3.31  3.85  3.70   Hemoglobin 12.0 - 15.0 g/dL 82.9  56.2  13.0   HCT 36.0 - 46.0 % 32.1  37.8  36.1   Platelets 150 - 400 K/uL 177  209  227   NEUT# 1.7 - 7.7 K/uL  6.8  9.5   Lymph# 0.7 - 4.0 K/uL  1.7  1.2      There is no height or weight on file to calculate  BMI.  Orders:  No orders of the defined types were placed in this encounter.  Meds ordered this encounter  Medications   nitroGLYCERIN (NITRODUR - DOSED IN MG/24 HR) 0.2 mg/hr patch    Sig: Place 1 patch (0.2 mg total) onto the skin daily.    Dispense:  30 patch    Refill:  12     Procedures: No procedures performed  Clinical Data: No additional findings.  ROS:  All other systems negative, except as noted in the HPI. Review of Systems  Objective: Vital Signs: There were no vitals taken for this visit.  Specialty Comments:  EXAM: MRI LUMBAR SPINE WITHOUT CONTRAST   TECHNIQUE: Multiplanar, multisequence MR imaging of the lumbar spine was performed. No intravenous contrast was administered.   COMPARISON:  RADIOGRAPHY OF THE LUMBAR DATED 11/16/2020. RADIOGRAPHY OF THE LUMBAR DATED 08/07/2018.   FINDINGS: Segmentation:  Standard.   Alignment: Grade 1 anterolisthesis of L3 on L4, L4 on L5 and L5-S1 secondary  to facet disease.   Vertebrae: No acute fracture, evidence of discitis, or aggressive bone lesion.   Conus medullaris and cauda equina: Conus extends to the L1-2 level. Conus and cauda equina appear normal.   Paraspinal and other soft tissues: No acute paraspinal abnormality. Right renal cyst.   Disc levels:   Disc spaces: Degenerative disease with disc height loss at L3-4, L4-5 and L5-S1.   T12-L1: Minimal broad-based disc bulge. No foraminal or central canal stenosis.   L1-L2: No significant disc bulge. No neural foraminal stenosis. No central canal stenosis.   L2-L3: Minimal broad-based disc bulge. Mild bilateral facet arthropathy. No foraminal or central canal stenosis.   L3-L4: Broad-based disc bulge with a broad left paracentral disc protrusion with mass effect on the left intraspinal L4 nerve root. Moderate bilateral facet arthropathy. Moderate-severe spinal stenosis. Left subarticular recess stenosis. Moderate left foraminal stenosis. Mild right foraminal stenosis.   L4-L5: Broad-based disc bulge with a broad central disc protrusion. Severe bilateral facet arthropathy with ligamentum flavum infolding. Severe spinal stenosis. Mild right and severe left foraminal stenosis.   L5-S1: Mild broad-based disc bulge with a broad shallow right paracentral disc protrusion with mass effect on the right intraspinal S1 nerve root. Mild bilateral foraminal stenosis. Mild bilateral facet arthropathy. No spinal stenosis.   IMPRESSION: 1. Lumbar spine spondylosis as described above most severe at L3-4, L4-5 and L5-S1. 2. No acute osseous injury of the lumbar spine.     Electronically Signed   By: Elige Ko M.D.   On: 11/23/2020 08:11  PMFS History: Patient Active Problem List   Diagnosis Date Noted   Elevated LFTs 04/04/2023   Anemia 04/04/2023   Choledocholithiasis 04/03/2023   Acute biliary pancreatitis 08/07/2022   Sepsis (HCC) 08/07/2022   Sacral wound  05/14/2022   Coronary artery disease involving native coronary artery of native heart without angina pectoris 02/21/2022   Discitis of lumbar region 02/05/2022   Herpes zoster without complication 02/05/2022   Clostridium difficile colitis 02/05/2022   Severe sepsis with acute organ dysfunction due to Gram positive bacteria (HCC) 02/02/2022   Pacemaker infection (HCC) 02/01/2022   Periostitis of lumbar spine without osteomyelitis (HCC) 02/01/2022   Acute midline low back pain without sciatica 01/26/2022   MSSA bacteremia 01/25/2022   Presence of cardiac pacemaker 01/25/2022   Severe sepsis with septic shock (HCC) 01/24/2022   AKI (acute kidney injury) (HCC) 01/24/2022   Sinus pause 01/04/2022   Acute viral bronchitis 02/06/2021   Acute blood loss anemia  Postoperative pain    S/P transmetatarsal amputation of foot, right (HCC) 01/24/2021   Protein-calorie malnutrition, severe 01/17/2021   Diabetic foot infection (HCC) 01/16/2021   Rheumatoid arthritis (HCC) 01/16/2021   Stage 3b chronic kidney disease (CKD) (HCC) 01/16/2021   DNR (do not resuscitate) 01/16/2021   Decreased anal sphincter tone 12/13/2018   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   Irritable bowel syndrome with diarrhea - post infectious 12/12/2018   Full incontinence of feces and fecal smearing 12/12/2018   Perianal dermatitis 12/12/2018   Uncontrolled type 2 diabetes mellitus with hyperglycemia (HCC) 10/26/2018   Long-term use of immunosuppressant medication-Enbrel 10/26/2018   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 08/27/2018   Thrombocytopenia (HCC)    Essential hypertension    Dyspnea on exertion    Steroid-induced hyperglycemia    Fatigue 08/06/2018   Paroxysmal atrial fibrillation (HCC)    Mixed hyperlipidemia 09/29/2016   Stable angina (HCC) 09/29/2016   PAD (peripheral artery disease) (HCC) 08/09/2016   Preoperative clearance 05/21/2012   Precordial chest pain    Palpitations    Diabetes mellitus  (HCC)    GERD (gastroesophageal reflux disease)    Ejection fraction    Carotid artery disease (HCC)    Mitral regurgitation 04/21/2009   PSORIASIS 02/09/2009   Rheumatoid arthritis flare (HCC) 02/09/2009   Past Medical History:  Diagnosis Date   Bruit    Carotid Doppler showed no significant abnormality     9per patient)   C. difficile colitis 07/2018   with severe sepsis   Chest pain, unspecified    Nuclear, May, 2008, no scar or ischemia   Diabetes mellitus    Diverticulosis    Dyslipidemia    Ejection fraction    EF 55-60%, echo, February, 2011 // Echocardiogram 8/21: EF 60-65, no RWMA, Gr 1 DD, GLS -14%, normal RVSF, mild LAE, trivial MR, mild MS (mean 4 mmHg), RVSP 23.4    GERD (gastroesophageal reflux disease)    Mitral regurgitation 04/21/2009   mild,  echo, February, 2011   Osteoporosis    Palpitations    possible very brief atrial fibrillation on monitor and possible reentrant tachycardia   PONV (postoperative nausea and vomiting)    Psoriasis    Rheumatoid arthritis (HCC)     Family History  Problem Relation Age of Onset   Heart failure Father    Heart attack Father    Diabetes Father    Heart attack Mother    Heart failure Mother    Diabetes Mother    Stroke Sister     Past Surgical History:  Procedure Laterality Date   ABDOMINAL AORTOGRAM W/LOWER EXTREMITY Right 10/19/2020   Procedure: ABDOMINAL AORTOGRAM W/LOWER EXTREMITY;  Surgeon: Iran Ouch, MD;  Location: [facility] INVASIVE CV LAB;  Service: Cardiovascular;  Laterality: Right;   CHOLECYSTECTOMY N/A 08/09/2022   Procedure: LAPAROSCOPIC CHOLECYSTECTOMY WITH INTRAOPERATIVE CHOLANGIOGRAM;  Surgeon: Quentin Ore, MD;  Location: [facility] OR;  Service: General;  Laterality: N/A;   ENDOSCOPIC RETROGRADE CHOLANGIOPANCREATOGRAPHY (ERCP) WITH PROPOFOL N/A 04/05/2023   Procedure: ENDOSCOPIC RETROGRADE CHOLANGIOPANCREATOGRAPHY (ERCP) WITH PROPOFOL;  Surgeon: Hilarie Fredrickson, MD;  Location: [facility] ENDOSCOPY;  Service:  Gastroenterology;  Laterality: N/A;   FRACTURE SURGERY     LOOP RECORDER REMOVAL N/A 01/05/2022   Procedure: LOOP RECORDER REMOVAL;  Surgeon: Marinus Maw, MD;  Location: [facility]AS[facility] LAB;  Service: Cardiovascular;  Laterality: N/A;   PACEMAKER IMPLANT N/A 01/05/2022   Procedure: PACEMAKER IMPLANT;  Surgeon: Marinus Maw, MD;  Location: [facility] INVASIVE  CV LAB;  Service: Cardiovascular;  Laterality: N/A;   PPM GENERATOR REMOVAL N/A 01/29/2022   Procedure: PPM GENERATOR REMOVAL;  Surgeon: Marinus Maw, MD;  Location: [facility];  Service: Cardiovascular;  Laterality: N/A;   REMOVAL OF STONES  04/05/2023   Procedure: REMOVAL OF STONES;  Surgeon: Hilarie Fredrickson, MD;  Location: [facility];  Service: Gastroenterology;;   Dennison Mascot  04/05/2023   Procedure: Dennison Mascot;  Surgeon: Hilarie Fredrickson, MD;  Location: [facility] ENDOSCOPY;  Service: Gastroenterology;;   TEE WITHOUT CARDIOVERSION N/A 01/29/2022   Procedure: TRANSESOPHAGEAL ECHOCARDIOGRAM (TEE);  Surgeon: Sande Rives, MD;  Location: [facility] ENDOSCOPY;  Service: Cardiovascular;  Laterality: N/A;   TRANSMETATARSAL AMPUTATION Right 01/18/2021   Procedure: TRANSMETATARSAL AMPUTATION;  Surgeon: Toni Arthurs, MD;  Location[facility] OR;  Service: Orthopedics;  Laterality: Right;   Social History   Occupational History    Employer: RETIRED  Tobacco Use   Smoking status: Never   Smokeless tobacco: Never  Vaping Use   Vaping status: Never Used  Substance and Sexual Activity   Alcohol use: No    Alcohol/week: 0.0 standard drinks of alcohol   Drug use: No   Sexual activity: Yes    Birth control/protection: Post-menopausal

## 2023-04-24 ENCOUNTER — Other Ambulatory Visit: Payer: Self-pay

## 2023-04-24 ENCOUNTER — Encounter (HOSPITAL_COMMUNITY): Payer: Self-pay | Admitting: Orthopedic Surgery

## 2023-04-24 NOTE — Progress Notes (Addendum)
Spoke with patient's daughter [name] for PAT information and instructions for DOS.   PCP - Dr Adrian Prince Cardiologist - Dr Laqueta Carina (clearance in EPIC on 04/22/23) EP - Dr Sherryl Manges  Chest x-ray - 08/08/22 EKG - 04/03/23 Stress Test - 12/2015 ECHO TEE - 01/29/22 Cardiac Cath - n/a  LOOP recorder in place.  Sleep Study -  n/a  Diabetes Type 2 FreeStyle Libre 2, Sensor located left upper arm Fasting Blood Sugar - 140-170s Checks Blood Sugar 3-4 times a day  THE NIGHT BEFORE SURGERY, take 3 units Tresiba Insulin.    THE MORNING OF SURGERY, do not take Novolog Insulin unless your CBG is > 220.  If CBG >220, your may take 1/2 of your sliding scale (correction) dose of insulin.  If your blood sugar is less than 70 mg/dL, you will need to treat for low blood sugar: Treat a low blood sugar (less than 70 mg/dL) with  cup of clear juice (cranberry or apple), 4 glucose tablets, OR glucose gel. Recheck blood sugar in 15 minutes after treatment (to make sure it is greater than 70 mg/dL). If your blood sugar is not greater than 70 mg/dL on recheck, call [phone] for further instructions.  Aspirin & Blood Thinner Instructions:  n/a  ERAS - clear liquids til 9:45 AM DOS.   Nitroglycerine - If you have to take this medication prior to surgery, please call [phone] and report this to a nurse   Anesthesia review: Yes  STOP now taking any Aspirin (unless otherwise instructed by your surgeon), Aleve, Naproxen, Ibuprofen, Motrin, Advil, Goody's, BC's, all herbal medications, fish oil, and all vitamins.   Coronavirus Screening Does  you have any of the following symptoms:  Cough yes/no: No Fever (>100.26F)  yes/no: No Runny nose yes/no: No Sore throat yes/no: No Difficulty breathing/shortness of breath  yes/no: No  Have you traveled in the last 14 days and where? yes/no: No  Patient's Daughter [name] [phone]) verbalized understanding of instructions that were given via  phone.

## 2023-04-24 NOTE — Anesthesia Preprocedure Evaluation (Signed)
Anesthesia Evaluation  Patient identified by MRN, date of birth, ID band Patient awake    Reviewed: Allergy & Precautions, NPO status , Patient's Chart, lab work & pertinent test results, reviewed documented beta blocker date and time   History of Anesthesia Complications (+) PONV and history of anesthetic complications (pt denies)  Airway Mallampati: III  TM Distance: >3 FB     Dental  (+) Edentulous Lower, Edentulous Upper   Pulmonary pneumonia, resolved   Pulmonary exam normal breath sounds clear to auscultation       Cardiovascular hypertension, Pt. on medications + angina with exertion + CAD and + Peripheral Vascular Disease  Normal cardiovascular exam+ dysrhythmias Atrial Fibrillation + pacemaker + Valvular Problems/Murmurs (mild MR) MR  Rhythm:Regular Rate:Normal  Echo 01/29/22 1. Left ventricular ejection fraction, by estimation, is 50 to 55%. The  left ventricle has low normal function.   2. Right ventricular systolic function is normal. The right ventricular  size is normal. There is normal pulmonary artery systolic pressure.   3. Left atrial size was moderately dilated. No left atrial/left atrial  appendage thrombus was detected.   4. The mitral valve is degenerative. Mild mitral valve regurgitation. No  evidence of mitral stenosis.   5. The aortic valve is tricuspid. There is mild calcification of the  aortic valve. There is mild thickening of the aortic valve. Aortic valve  regurgitation is not visualized. Aortic valve sclerosis is present, with  no evidence of aortic valve stenosis.   6. There is mild (Grade II) layered plaque involving the descending aorta  and aortic arch.   EKG 04/04/23 NSR   Neuro/Psych negative neurological ROS  negative psych ROS   GI/Hepatic Neg liver ROS,GERD  Controlled and Medicated,,Choledocholithiasis S/P ERCP and laparoscopic Cholecystectomy   Endo/Other  diabetes, Poorly  Controlled, Type 2, Insulin Dependent  Hyperlipidemia   Renal/GU Renal InsufficiencyRenal disease  negative genitourinary   Musculoskeletal  (+) Arthritis , Osteoarthritis and Rheumatoid disorders,  Compression Fx L1 Osteoporosis   Abdominal   Peds  Hematology  (+) Blood dyscrasia, anemia Hb 10.1, plt 177   Anesthesia Other Findings   Reproductive/Obstetrics negative OB ROS                              Anesthesia Physical Anesthesia Plan  ASA: 3  Anesthesia Plan: General   Post-op Pain Management: Dilaudid IV   Induction: Intravenous  PONV Risk Score and Plan: 4 or greater and Ondansetron, Dexamethasone, Treatment may vary due to age or medical condition and TIVA  Airway Management Planned: Oral ETT  Additional Equipment: None  Intra-op Plan:   Post-operative Plan: Extubation in OR  Informed Consent: I have reviewed the patients History and Physical, chart, labs and discussed the procedure including the risks, benefits and alternatives for the proposed anesthesia with the patient or authorized representative who has indicated his/her understanding and acceptance.   Patient has DNR.   Dental advisory given  Plan Discussed with: Anesthesiologist and CRNA  Anesthesia Plan Comments:          Anesthesia Quick Evaluation

## 2023-04-25 ENCOUNTER — Encounter (HOSPITAL_COMMUNITY): Payer: Self-pay | Admitting: Orthopedic Surgery

## 2023-04-25 ENCOUNTER — Ambulatory Visit (HOSPITAL_COMMUNITY): Payer: Medicare Other

## 2023-04-25 ENCOUNTER — Ambulatory Visit (HOSPITAL_COMMUNITY)
Admission: RE | Admit: 2023-04-25 | Discharge: 2023-04-25 | Disposition: A | Discharge status: Home / Self Care | Payer: Medicare Other | Attending: Orthopedic Surgery | Admitting: Orthopedic Surgery

## 2023-04-25 ENCOUNTER — Other Ambulatory Visit: Payer: Self-pay

## 2023-04-25 ENCOUNTER — Ambulatory Visit (HOSPITAL_COMMUNITY): Payer: Self-pay | Admitting: Physician Assistant

## 2023-04-25 ENCOUNTER — Encounter (HOSPITAL_COMMUNITY)
Admission: RE | Disposition: A | Discharge status: Home / Self Care | Payer: Self-pay | Source: Home / Self Care | Attending: Orthopedic Surgery

## 2023-04-25 ENCOUNTER — Ambulatory Visit (HOSPITAL_BASED_OUTPATIENT_CLINIC_OR_DEPARTMENT_OTHER): Payer: Medicare Other | Admitting: Physician Assistant

## 2023-04-25 DIAGNOSIS — I4891 Unspecified atrial fibrillation: Secondary | ICD-10-CM | POA: Insufficient documentation

## 2023-04-25 DIAGNOSIS — E119 Type 2 diabetes mellitus without complications: Secondary | ICD-10-CM | POA: Diagnosis not present

## 2023-04-25 DIAGNOSIS — I129 Hypertensive chronic kidney disease with stage 1 through stage 4 chronic kidney disease, or unspecified chronic kidney disease: Secondary | ICD-10-CM | POA: Diagnosis not present

## 2023-04-25 DIAGNOSIS — Z950 Presence of cardiac pacemaker: Secondary | ICD-10-CM | POA: Insufficient documentation

## 2023-04-25 DIAGNOSIS — M8008XA Age-related osteoporosis with current pathological fracture, vertebra(e), initial encounter for fracture: Secondary | ICD-10-CM | POA: Diagnosis not present

## 2023-04-25 DIAGNOSIS — Z794 Long term (current) use of insulin: Secondary | ICD-10-CM | POA: Insufficient documentation

## 2023-04-25 DIAGNOSIS — I10 Essential (primary) hypertension: Secondary | ICD-10-CM | POA: Diagnosis not present

## 2023-04-25 DIAGNOSIS — M8088XG Other osteoporosis with current pathological fracture, vertebra(e), subsequent encounter for fracture with delayed healing: Secondary | ICD-10-CM

## 2023-04-25 DIAGNOSIS — E1151 Type 2 diabetes mellitus with diabetic peripheral angiopathy without gangrene: Secondary | ICD-10-CM | POA: Diagnosis not present

## 2023-04-25 DIAGNOSIS — E785 Hyperlipidemia, unspecified: Secondary | ICD-10-CM | POA: Diagnosis not present

## 2023-04-25 DIAGNOSIS — I739 Peripheral vascular disease, unspecified: Secondary | ICD-10-CM | POA: Diagnosis not present

## 2023-04-25 DIAGNOSIS — N189 Chronic kidney disease, unspecified: Secondary | ICD-10-CM | POA: Insufficient documentation

## 2023-04-25 DIAGNOSIS — M1990 Unspecified osteoarthritis, unspecified site: Secondary | ICD-10-CM | POA: Insufficient documentation

## 2023-04-25 DIAGNOSIS — E1122 Type 2 diabetes mellitus with diabetic chronic kidney disease: Secondary | ICD-10-CM | POA: Insufficient documentation

## 2023-04-25 DIAGNOSIS — Z0189 Encounter for other specified special examinations: Secondary | ICD-10-CM | POA: Diagnosis not present

## 2023-04-25 DIAGNOSIS — M8088XA Other osteoporosis with current pathological fracture, vertebra(e), initial encounter for fracture: Secondary | ICD-10-CM

## 2023-04-25 DIAGNOSIS — I480 Paroxysmal atrial fibrillation: Secondary | ICD-10-CM | POA: Diagnosis not present

## 2023-04-25 HISTORY — DX: Personal history of urinary calculi: Z87.442

## 2023-04-25 HISTORY — DX: Presence of other cardiac implants and grafts: Z95.818

## 2023-04-25 HISTORY — PX: KYPHOPLASTY: SHX5884

## 2023-04-25 HISTORY — DX: Anemia, unspecified: D64.9

## 2023-04-25 HISTORY — DX: Pneumonia, unspecified organism: J18.9

## 2023-04-25 HISTORY — DX: Hyperlipidemia, unspecified: E78.5

## 2023-04-25 HISTORY — DX: Chronic kidney disease, unspecified: N18.9

## 2023-04-25 HISTORY — DX: Other amnesia: R41.3

## 2023-04-25 HISTORY — DX: Cardiac arrhythmia, unspecified: I49.9

## 2023-04-25 LAB — GLUCOSE, CAPILLARY
Glucose-Capillary: 188 mg/dL — ABNORMAL HIGH (ref 70–99)
Glucose-Capillary: 201 mg/dL — ABNORMAL HIGH (ref 70–99)
Glucose-Capillary: 217 mg/dL — ABNORMAL HIGH (ref 70–99)
Glucose-Capillary: 244 mg/dL — ABNORMAL HIGH (ref 70–99)
Glucose-Capillary: 241 mg/dL — ABNORMAL HIGH (ref 70–99)

## 2023-04-25 SURGERY — KYPHOPLASTY
Anesthesia: General

## 2023-04-25 MED ORDER — ALBUMIN HUMAN 5 % IV SOLN
INTRAVENOUS | Status: DC | PRN
Start: 2023-04-25 — End: 2023-04-25

## 2023-04-25 MED ORDER — EPHEDRINE SULFATE-NACL 50-0.9 MG/10ML-% IV SOSY
PREFILLED_SYRINGE | INTRAVENOUS | Status: DC | PRN
  Administered 2023-04-25 (×2): 5 mg via INTRAVENOUS

## 2023-04-25 MED ORDER — POVIDONE-IODINE 7.5 % EX SOLN
Freq: Once | CUTANEOUS | Status: DC
  Filled 2023-04-25: qty 118

## 2023-04-25 MED ORDER — LIDOCAINE 2% (20 MG/ML) 5 ML SYRINGE
INTRAMUSCULAR | Status: AC
  Filled 2023-04-25: qty 5

## 2023-04-25 MED ORDER — PHENYLEPHRINE 80 MCG/ML (10ML) SYRINGE FOR IV PUSH (FOR BLOOD PRESSURE SUPPORT)
PREFILLED_SYRINGE | INTRAVENOUS | Status: AC
  Filled 2023-04-25: qty 10

## 2023-04-25 MED ORDER — ONDANSETRON HCL 4 MG/2ML IJ SOLN
INTRAMUSCULAR | Status: AC
  Filled 2023-04-25: qty 2

## 2023-04-25 MED ORDER — INSULIN ASPART 100 UNIT/ML IJ SOLN
4.0000 [IU] | Freq: Once | INTRAMUSCULAR | Status: AC
  Administered 2023-04-25: 4 [IU] via SUBCUTANEOUS

## 2023-04-25 MED ORDER — FENTANYL CITRATE (PF) 250 MCG/5ML IJ SOLN
INTRAMUSCULAR | Status: DC | PRN
Start: 2023-04-25 — End: 2023-04-25
  Administered 2023-04-25: 100 ug via INTRAVENOUS

## 2023-04-25 MED ORDER — OXYCODONE HCL 5 MG/5ML PO SOLN: 5.0000 mg | Freq: Once | ORAL | Status: DC | PRN

## 2023-04-25 MED ORDER — OXYCODONE HCL 5 MG PO TABS: 5.0000 mg | ORAL_TABLET | Freq: Once | ORAL | Status: DC | PRN

## 2023-04-25 MED ORDER — ROCURONIUM BROMIDE 10 MG/ML (PF) SYRINGE
PREFILLED_SYRINGE | INTRAVENOUS | Status: DC | PRN
  Administered 2023-04-25: 50 mg via INTRAVENOUS

## 2023-04-25 MED ORDER — INSULIN ASPART 100 UNIT/ML IJ SOLN
0.0000 [IU] | INTRAMUSCULAR | Status: AC | PRN
  Administered 2023-04-25 (×2): 2 [IU] via SUBCUTANEOUS
  Filled 2023-04-25 (×2): qty 1

## 2023-04-25 MED ORDER — PROPOFOL 10 MG/ML IV BOLUS
INTRAVENOUS | Status: DC | PRN
  Administered 2023-04-25: 100 mg via INTRAVENOUS

## 2023-04-25 MED ORDER — FENTANYL CITRATE (PF) 100 MCG/2ML IJ SOLN
INTRAMUSCULAR | Status: AC
  Filled 2023-04-25: qty 2

## 2023-04-25 MED ORDER — IOPAMIDOL (ISOVUE-300) INJECTION 61%
INTRAVENOUS | Status: DC | PRN
  Administered 2023-04-25: 100 mL

## 2023-04-25 MED ORDER — 0.9 % SODIUM CHLORIDE (POUR BTL) OPTIME
TOPICAL | Status: DC | PRN
  Administered 2023-04-25: 1000 mL

## 2023-04-25 MED ORDER — LACTATED RINGERS IV SOLN: INTRAVENOUS | Status: DC | PRN

## 2023-04-25 MED ORDER — DEXAMETHASONE SODIUM PHOSPHATE 10 MG/ML IJ SOLN
INTRAMUSCULAR | Status: AC
  Filled 2023-04-25: qty 1

## 2023-04-25 MED ORDER — SUGAMMADEX SODIUM 200 MG/2ML IV SOLN
INTRAVENOUS | Status: DC | PRN
  Administered 2023-04-25: 200 mg via INTRAVENOUS

## 2023-04-25 MED ORDER — SUGAMMADEX SODIUM 200 MG/2ML IV SOLN
INTRAVENOUS | Status: AC
  Filled 2023-04-25: qty 2

## 2023-04-25 MED ORDER — LIDOCAINE 2% (20 MG/ML) 5 ML SYRINGE
INTRAMUSCULAR | Status: DC | PRN
  Administered 2023-04-25: 60 mg via INTRAVENOUS

## 2023-04-25 MED ORDER — CEFAZOLIN SODIUM-DEXTROSE 2-4 GM/100ML-% IV SOLN
2.0000 g | INTRAVENOUS | Status: AC
Start: 2023-04-25 — End: 2023-04-25
  Administered 2023-04-25: 2 g via INTRAVENOUS
  Filled 2023-04-25: qty 100

## 2023-04-25 MED ORDER — CHLORHEXIDINE GLUCONATE 0.12 % MT SOLN
OROMUCOSAL | Status: AC
  Administered 2023-04-25: 15 mL
  Filled 2023-04-25: qty 15

## 2023-04-25 MED ORDER — EPHEDRINE 5 MG/ML INJ
INTRAVENOUS | Status: AC
  Filled 2023-04-25: qty 5

## 2023-04-25 MED ORDER — ONDANSETRON HCL 4 MG/2ML IJ SOLN
INTRAMUSCULAR | Status: DC | PRN
  Administered 2023-04-25: 4 mg via INTRAVENOUS

## 2023-04-25 MED ORDER — BACITRACIN 500 UNIT/GM EX OINT
TOPICAL_OINTMENT | CUTANEOUS | Status: DC | PRN
  Administered 2023-04-25: 1 via TOPICAL

## 2023-04-25 MED ORDER — ROCURONIUM BROMIDE 10 MG/ML (PF) SYRINGE
PREFILLED_SYRINGE | INTRAVENOUS | Status: AC
  Filled 2023-04-25: qty 10

## 2023-04-25 MED ORDER — PHENYLEPHRINE 80 MCG/ML (10ML) SYRINGE FOR IV PUSH (FOR BLOOD PRESSURE SUPPORT)
PREFILLED_SYRINGE | INTRAVENOUS | Status: DC | PRN
  Administered 2023-04-25 (×4): 80 ug via INTRAVENOUS

## 2023-04-25 MED ORDER — TRAMADOL HCL 50 MG PO TABS: 50.0000 mg | ORAL_TABLET | Freq: Four times a day (QID) | ORAL | 0 refills | Status: AC | PRN

## 2023-04-25 MED ORDER — ONDANSETRON HCL 4 MG/2ML IJ SOLN: 4.0000 mg | Freq: Once | INTRAMUSCULAR | Status: DC | PRN

## 2023-04-25 MED ORDER — METHOCARBAMOL 500 MG PO TABS
500.0000 mg | ORAL_TABLET | Freq: Four times a day (QID) | ORAL | 0 refills | Status: DC | PRN
Start: 2023-04-25 — End: 2024-01-30

## 2023-04-25 MED ORDER — FENTANYL CITRATE (PF) 100 MCG/2ML IJ SOLN
25.0000 ug | INTRAMUSCULAR | Status: DC | PRN
  Administered 2023-04-25: 25 ug via INTRAVENOUS

## 2023-04-25 MED ORDER — FENTANYL CITRATE (PF) 250 MCG/5ML IJ SOLN
INTRAMUSCULAR | Status: AC
  Filled 2023-04-25: qty 5

## 2023-04-25 MED ORDER — BACITRACIN ZINC 500 UNIT/GM EX OINT
TOPICAL_OINTMENT | CUTANEOUS | Status: AC
  Filled 2023-04-25: qty 28.35

## 2023-04-25 SURGICAL SUPPLY — 43 items
BAG COUNTER SPONGE SURGICOUNT (BAG) ×2 IMPLANT
BLADE SURG 15 STRL LF DISP TIS (BLADE) ×2 IMPLANT
CEMENT KYPHON C01A KIT/MIXER (Cement) IMPLANT
COVER MAYO STAND STRL (DRAPES) ×2 IMPLANT
COVER SURGICAL LIGHT HANDLE (MISCELLANEOUS) ×2 IMPLANT
CURETTE EXPRESS SZ2 7MM (INSTRUMENTS) IMPLANT
CURRETTE EXPRESS SZ2 7MM (INSTRUMENTS)
DRAPE C-ARM 42X72 X-RAY (DRAPES) ×2 IMPLANT
DRAPE HALF SHEET 40X57 (DRAPES) IMPLANT
DRAPE INCISE IOBAN 66X45 STRL (DRAPES) ×2 IMPLANT
DRAPE LAPAROTOMY T 102X78X121 (DRAPES) ×2 IMPLANT
DRAPE SURG 17X23 STRL (DRAPES) ×8 IMPLANT
DRAPE WARM FLUID 44X44 (DRAPES) ×2 IMPLANT
DURAPREP 26ML APPLICATOR (WOUND CARE) ×2 IMPLANT
GAUZE 4X4 16PLY ~~LOC~~+RFID DBL (SPONGE) ×2 IMPLANT
GAUZE SPONGE 2X2 8PLY STRL LF (GAUZE/BANDAGES/DRESSINGS) ×2 IMPLANT
GAUZE SPONGE 4X4 12PLY STRL (GAUZE/BANDAGES/DRESSINGS) IMPLANT
GLOVE BIO SURGEON STRL SZ 6.5 (GLOVE) ×2 IMPLANT
GLOVE BIO SURGEON STRL SZ8 (GLOVE) ×2 IMPLANT
GLOVE BIOGEL PI IND STRL 7.0 (GLOVE) ×2 IMPLANT
GLOVE BIOGEL PI IND STRL 8 (GLOVE) ×2 IMPLANT
GLOVE SURG ENC MOIS LTX SZ6.5 (GLOVE) ×2 IMPLANT
GOWN STRL REUS W/ TWL LRG LVL3 (GOWN DISPOSABLE) ×4 IMPLANT
GOWN STRL REUS W/ TWL XL LVL3 (GOWN DISPOSABLE) ×2 IMPLANT
KIT BASIN OR (CUSTOM PROCEDURE TRAY) ×2 IMPLANT
KIT TURNOVER KIT B (KITS) ×2 IMPLANT
NDL 22X1.5 STRL (OR ONLY) (MISCELLANEOUS) IMPLANT
NDL HYPO 25X1 1.5 SAFETY (NEEDLE) ×2 IMPLANT
NDL SPNL 18GX3.5 QUINCKE PK (NEEDLE) ×4 IMPLANT
NEEDLE 22X1.5 STRL (OR ONLY) (MISCELLANEOUS)
NEEDLE HYPO 25X1 1.5 SAFETY (NEEDLE) ×1
NEEDLE SPNL 18GX3.5 QUINCKE PK (NEEDLE) ×2
NS IRRIG 1000ML POUR BTL (IV SOLUTION) ×2 IMPLANT
PACK UNIVERSAL I (CUSTOM PROCEDURE TRAY) ×2 IMPLANT
PAD ARMBOARD 7.5X6 YLW CONV (MISCELLANEOUS) ×4 IMPLANT
POSITIONER HEAD PRONE TRACH (MISCELLANEOUS) ×2 IMPLANT
SUT MNCRL AB 4-0 PS2 18 (SUTURE) ×2 IMPLANT
SYR BULB IRRIG 60ML STRL (SYRINGE) ×2 IMPLANT
SYR CONTROL 10ML LL (SYRINGE) ×2 IMPLANT
TOWEL GREEN STERILE (TOWEL DISPOSABLE) ×2 IMPLANT
TOWEL GREEN STERILE FF (TOWEL DISPOSABLE) ×2 IMPLANT
TRAY KYPHOPAK 15/3 ONESTEP 1ST (MISCELLANEOUS) IMPLANT
TRAY KYPHOPAK 20/3 ONESTEP 1ST (MISCELLANEOUS) IMPLANT

## 2023-04-25 NOTE — Anesthesia Procedure Notes (Signed)
Procedure Name: Intubation Date/Time: 04/25/2023 1:18 PM  Performed by: Shary Decamp, CRNAPre-anesthesia Checklist: Patient identified, Patient being monitored, Timeout performed, Emergency Drugs available and Suction available Patient Re-evaluated:Patient Re-evaluated prior to induction Oxygen Delivery Method: Circle System Utilized Preoxygenation: Pre-oxygenation with 100% oxygen Induction Type: IV induction Ventilation: Mask ventilation without difficulty Laryngoscope Size: Miller and 2 Grade View: Grade I Tube type: Oral Tube size: 7.0 mm Number of attempts: 1 Airway Equipment and Method: Stylet Placement Confirmation: ETT inserted through vocal cords under direct vision, positive ETCO2 and breath sounds checked- equal and bilateral Secured at: 21 cm Tube secured with: Tape Dental Injury: Teeth and Oropharynx as per pre-operative assessment

## 2023-04-25 NOTE — Op Note (Signed)
PATIENT NAME: Shelby Walker   MEDICAL RECORD NO.:   [id number]    DATE OF BIRTH: 11/22/1933   DATE OF PROCEDURE: 04/25/2023                              OPERATIVE REPORT   PREOPERATIVE DIAGNOSIS:  Osteoporotic L1 compression fracture (M80.88XA)  POSTOPERATIVE DIAGNOSIS:  Osteoporotic L1 compression fracture (M80.88XA)  PROCEDURE:  L1 kyphoplasty.  SURGEON:  Estill Bamberg, MD.  ASSISTANTJason Coop, PA-C  ANESTHESIA:  General endotracheal anesthesia.  COMPLICATIONS:  None.  DISPOSITION:  Stable.  ESTIMATED BLOOD LOSS:  Minimal.  INDICATIONS FOR SURGERY:  Briefly, [name] is a very pleasant 89 y.o.- year-old female, who did have a recent onset of severe pain in her low back.   The patient's imaging studies did clearly reveal an L1 compression fracture. Given her ongoing pain and dysfunction, we did discuss proceeding with the procedure noted above.  I did fully discuss the procedure with the patient, and she did wish to proceed.  OPERATIVE DETAILS:  On ,04/25/2023 the patient was brought to surgery and general endotracheal anesthesia was administered.  The patient was placed prone on a well-padded flat Jackson bed with gel rolls placed under the patient's chest and hips.  Antibiotics were given.  AP and lateral fluoroscopy was brought into the field.  The L1 pedicles were marked out in the usual fashion.  After a time-out procedure was performed, I did advance Jamshidis across the L1 pedicles on the right and left sides.  I then drilled through the Jamshidis.  I then inserted kyphoplasty balloons and I was able to inflate the balloons with approximately 4cc of contrast.  Partial restoration of the superior endplate was noted.  The balloons were deflated then removed.  At this point, after the cement was mixed, a total of approximately 4cc of cement was injected through the right and left cannulas, half on the right, and half on the left.  Excellent interdigitation of  cement was identified.  No abnormal extravasation was noted.  The cement was then allowed to harden, after which point the Jamshidis were removed.  The wound  was then irrigated and closed using 4-0 Monocryl.  Bacitracin and a sterile dressing were applied.  The patient was then awoken from general endotracheal anesthesia and transferred to recovery in stable condition.  Estill Bamberg, MD

## 2023-04-25 NOTE — Anesthesia Postprocedure Evaluation (Signed)
Anesthesia Post Note  Patient: Shelby Walker  Procedure(s) Performed: LUMBAR 1 KYPHOPLASTY     Patient location during evaluation: PACU Anesthesia Type: General Level of consciousness: awake and alert and oriented Pain management: pain level controlled Vital Signs Assessment: post-procedure vital signs reviewed and stable Respiratory status: spontaneous breathing, nonlabored ventilation and respiratory function stable Cardiovascular status: blood pressure returned to baseline and stable Postop Assessment: no apparent nausea or vomiting Anesthetic complications: no   No notable events documented.  Last Vitals:  Vitals:   04/25/23 1515 04/25/23 1530  BP: 131/72 (!) 127/55  Pulse: 75 75  Resp: 11 11  Temp:  36.6 C  SpO2: 97% 94%    Last Pain:  Vitals:   04/25/23 1530  TempSrc:   PainSc: 0-No pain                 Dainel Arcidiacono A.

## 2023-04-25 NOTE — Transfer of Care (Signed)
Immediate Anesthesia Transfer of Care Note  Patient: Algernon Huxley Taber  Procedure(s) Performed: LUMBAR 1 KYPHOPLASTY  Patient Location: PACU  Anesthesia Type:General  Level of Consciousness: awake, alert , patient cooperative, and responds to stimulation  Airway & Oxygen Therapy: Patient Spontanous Breathing and Patient connected to face mask oxygen  Post-op Assessment: Report given to RN, Post -op Vital signs reviewed and stable, and Patient moving all extremities X 4  Post vital signs: Reviewed and stable  Last Vitals:  Vitals Value Taken Time  BP    Temp    Pulse 82 04/25/23 1425  Resp 12 04/25/23 1425  SpO2 100 % 04/25/23 1425  Vitals shown include unfiled device data.  Last Pain:  Vitals:   04/25/23 1034  TempSrc:   PainSc: 0-No pain         Complications: No notable events documented.

## 2023-04-25 NOTE — H&P (Signed)
PREOPERATIVE H&P  Chief Complaint: Low back pain  HPI: Shelby Walker is a 89 y.o. female who presents with ongoing pain in the low back  MRI reveals a subacute compression fracture at L1  Patient has failed multiple forms of conservative care and continues to have pain (see office notes for additional details regarding the patient's full course of treatment)  Past Medical History:  Diagnosis Date   Anemia    Bruit    Carotid Doppler showed no significant abnormality     9per patient)   C. difficile colitis 07/2018   with severe sepsis   Chest pain, unspecified    Nuclear, May, 2008, no scar or ischemia   Chronic kidney disease    Diabetes mellitus    Diverticulosis    Dyslipidemia    Dysrhythmia    a-fib   Ejection fraction    EF 55-60%, echo, February, 2011 // Echocardiogram 8/21: EF 60-65, no RWMA, Gr 1 DD, GLS -14%, normal RVSF, mild LAE, trivial MR, mild MS (mean 4 mmHg), RVSP 23.4    GERD (gastroesophageal reflux disease)    History of kidney stones    removed   HLD (hyperlipidemia)    Implantable loop recorder present    Memory loss    Mitral regurgitation 04/21/2009   mild,  echo, February, 2011   Osteoporosis    Palpitations    possible very brief atrial fibrillation on monitor and possible reentrant tachycardia   Pneumonia    PONV (postoperative nausea and vomiting)    Psoriasis    Rheumatoid arthritis (HCC)    Past Surgical History:  Procedure Laterality Date   ABDOMINAL AORTOGRAM W/LOWER EXTREMITY Right 10/19/2020   Procedure: ABDOMINAL AORTOGRAM W/LOWER EXTREMITY;  Surgeon: Iran Ouch, MD;  Location: [facility];  Service: Cardiovascular;  Laterality: Right;   CHOLECYSTECTOMY N/A 08/09/2022   Procedure: LAPAROSCOPIC CHOLECYSTECTOMY WITH INTRAOPERATIVE CHOLANGIOGRAM;  Surgeon: Quentin Ore, MD;  Location: [facility] OR;  Service: General;  Laterality: N/A;   ENDOSCOPIC RETROGRADE CHOLANGIOPANCREATOGRAPHY (ERCP) WITH PROPOFOL N/A  04/05/2023   Procedure: ENDOSCOPIC RETROGRADE CHOLANGIOPANCREATOGRAPHY (ERCP) WITH PROPOFOL;  Surgeon: Hilarie Fredrickson, MD;  Location: [facility];  Service: Gastroenterology;  Laterality: N/A;   FRACTURE SURGERY     LOOP Recorder placement     LOOP RECORDER REMOVAL N/A 01/05/2022   Procedure: LOOP RECORDER REMOVAL;  Surgeon: Marinus Maw, MD;  Location: [facility];  Service: Cardiovascular;  Laterality: N/A;   PACEMAKER IMPLANT N/A 01/05/2022   Procedure: [employer] PACEMAKER IMPLANT;  Surgeon: Marinus Maw, MD;  Location: [facility];  Service: Cardiovascular;  Laterality: N/A;   PPM GENERATOR REMOVAL N/A 01/29/2022   Procedure: PPM GENERATOR REMOVAL;  Surgeon: Marinus Maw, MD;  Location: [facility];  Service: Cardiovascular;  Laterality: N/A;   REMOVAL OF STONES  04/05/2023   Procedure: REMOVAL OF STONES;  Surgeon: Hilarie Fredrickson, MD;  Location: [facility];  Service: Gastroenterology;;   Dennison Mascot  04/05/2023   Procedure: Dennison Mascot;  Surgeon: Hilarie Fredrickson, MD;  Location: [facility];  Service: Gastroenterology;;   TEE WITHOUT CARDIOVERSION N/A 01/29/2022   Procedure: TRANSESOPHAGEAL ECHOCARDIOGRAM (TEE);  Surgeon: Sande Rives, MD;  Location: [facility];  Service: Cardiovascular;  Laterality: N/A;   TRANSMETATARSAL AMPUTATION Right 01/18/2021   Procedure: TRANSMETATARSAL AMPUTATION;  Surgeon: Toni Arthurs, MD;  Location: [facility];  Service: Orthopedics;  Laterality: Right;   Social History   Socioeconomic History   Marital  status: Widowed    Spouse name: Greggory Stallion   Number of children: 2   Years of education: 13   Highest education level: Associate degree: academic program  Occupational History    Employer: RETIRED  Tobacco Use   Smoking status: Never   Smokeless tobacco: Never  Vaping Use   Vaping status: Never Used  Substance and Sexual Activity   Alcohol use: No    Alcohol/week: 0.0 standard drinks of alcohol   Drug use: No    Sexual activity: Yes    Birth control/protection: Post-menopausal  Other Topics Concern   Not on file  Social History Narrative   She is married to [place] and lives with him 2 children   Retired   No alcohol or drugs and never smoker   Social Drivers of Corporate investment banker Strain: Low Risk  (07/24/2018)   Overall Financial Resource Strain (CARDIA)    Difficulty of Paying Living Expenses: Not hard at all  Food Insecurity: No Food Insecurity (04/04/2023)   Hunger Vital Sign    Worried About Running Out of Food in the Last Year: Never true    Ran Out of Food in the Last Year: Never true  Transportation Needs: No Transportation Needs (04/04/2023)   PRAPARE - Administrator, Civil Service (Medical): No    Lack of Transportation (Non-Medical): No  Physical Activity: Sufficiently Active (07/24/2018)   Exercise Vital Sign    Days of Exercise per Week: 5 days    Minutes of Exercise per Session: 30 min  Stress: No Stress Concern Present (07/24/2018)   [employer] of Occupational Health - Occupational Stress Questionnaire    Feeling of Stress : Not at all  Social Connections: Socially Integrated (04/04/2023)   Social Connection and Isolation Panel [NHANES]    Frequency of Communication with Friends and Family: More than three times a week    Frequency of Social Gatherings with Friends and Family: More than three times a week    Attends Religious Services: More than 4 times per year    Active Member of [employer] or Organizations: Yes    Attends Engineer, structural: More than 4 times per year    Marital Status: Married   Family History  Problem Relation Age of Onset   Heart failure Father    Heart attack Father    Diabetes Father    Heart attack Mother    Heart failure Mother    Diabetes Mother    Stroke Sister    Allergies  Allergen Reactions   Cholestyramine     Possible- Makes throat burn. Patient said she can't take it    Compazine  [Prochlorperazine Edisylate] Swelling    REACTION: " tongue swell and unable to swallow"   Celecoxib Diarrhea   Sulfonamide Derivatives Other (See Comments)    REACTION: " broke out with fine itching bumps"   Hydrocodone Other (See Comments)   Infliximab Other (See Comments)    Other reaction(s): rash   Leflunomide Other (See Comments)    Other reaction(s): diarrhea   Prochlorperazine Other (See Comments)    Other reaction(s): Unknown  Other Reaction(s): tongue swells   Rosuvastatin     Other reaction(s): muscle aches   Sulfa Antibiotics Other (See Comments)    Other reaction(s): tongue swelling   Prior to Admission medications   Medication Sig Start Date End Date Taking? Authorizing Provider  acetaminophen (TYLENOL) 325 MG tablet Take 650 mg by mouth every 6 (six) hours  as needed for moderate pain (pain score 4-6).    [provider]  acetaminophen-codeine (TYLENOL #3) 300-30 MG tablet Take 1 tablet by mouth every 4 (four) hours as needed for moderate pain (pain score 4-6).    [provider]  amiodarone (PACERONE) 200 MG tablet Take 0.5 tablets (100 mg total) by mouth daily. 09/10/22   Duke Salvia, MD  atorvastatin (LIPITOR) 20 MG tablet Take 1 tablet by mouth once daily 02/25/23   Duke Salvia, MD  Calcium Carbonate-Vitamin D 600-400 MG-UNIT tablet Take 1 tablet by mouth daily at 12 noon.     [provider]  diclofenac Sodium (VOLTAREN) 1 % GEL Apply 4 g topically 4 (four) times daily. Patient taking differently: Apply 4 g topically 4 (four) times daily as needed. 02/15/22   Setzer, Lynnell Jude, PA-C  esomeprazole (NEXIUM) 20 MG capsule Take 20 mg by mouth daily at 12 noon.    [provider]  folic acid (FOLVITE) 1 MG tablet Take 1 mg by mouth daily.    [provider]  insulin aspart (NOVOLOG) 100 UNIT/ML FlexPen Inject 2 Units into the skin 3 (three) times daily with meals. Sliding scale Patient taking differently: Inject 5-8 Units  into the skin 3 (three) times daily with meals. Sliding scale 02/15/22   Setzer, Lynnell Jude, PA-C  Insulin Degludec (TRESIBA) 100 UNIT/ML SOLN Inject 4 Units into the skin at bedtime. Patient taking differently: Inject 6 Units into the skin at bedtime. 02/15/22   Setzer, Lynnell Jude, PA-C  iron polysaccharides (NIFEREX) 150 MG capsule Take 150 mg by mouth daily.    [provider]  lidocaine (LIDODERM) 5 % Place 1 patch onto the skin daily. Remove & Discard patch within 12 hours or as directed by MD 04/07/23   Leatha Gilding, MD  methocarbamol (ROBAXIN) 500 MG tablet Take 500 mg by mouth as needed for muscle spasms.    [provider]  methotrexate 50 MG/2ML injection Inject 0.4 mLs into the muscle once a week. 07/26/22   [provider]  mirtazapine (REMERON) 7.5 MG tablet Take 7.5 mg by mouth at bedtime.    [provider]  Multiple Vitamins-Minerals (CENTRUM SILVER 50+WOMEN PO) Take 1 tablet by mouth daily.    [provider]  nitroGLYCERIN (NITRODUR - DOSED IN MG/24 HR) 0.2 mg/hr patch Place 1 patch (0.2 mg total) onto the skin daily. 04/15/23   Nadara Mustard, MD  nitroGLYCERIN (NITROSTAT) 0.4 MG SL tablet Dissolve 1 tablet under the tongue every 5 minutes as needed for chest pain. Max of 3 doses, then 911. 07/23/22   Nahser, Deloris Ping, MD  predniSONE (DELTASONE) 5 MG tablet Take 1 tablet (5 mg total) by mouth daily with breakfast. 02/15/22   Setzer, Lynnell Jude, PA-C  pregabalin (LYRICA) 50 MG capsule Take 50 mg by mouth in the morning.    [provider]  pregabalin (LYRICA) 75 MG capsule Take 75 mg by mouth at bedtime.    [provider]  protein supplement shake (PREMIER PROTEIN) LIQD Take 2 oz by mouth 2 (two) times daily.    [provider]  pyrithione zinc (SELSUN BLUE DRY SCALP) 1 % shampoo Apply 1 application topically once a week. 04/15/15   [provider]  RESTASIS 0.05 % ophthalmic emulsion Place 1 drop into both  eyes 2 (two) times daily. 02/13/19   [provider]  traMADol (ULTRAM) 50 MG tablet Take 1 tablet (50 mg total) by mouth  every 6 (six) hours as needed for severe pain (pain score 7-10). 04/07/23   Leatha Gilding, MD     All other systems have been reviewed and were otherwise negative with the exception of those mentioned in the HPI and as above.  Physical Exam: There were no vitals filed for this visit.  Body mass index is 21.95 kg/m.  General: Alert, no acute distress Cardiovascular: No pedal edema Respiratory: No cyanosis, no use of accessory musculature Skin: No lesions in the area of chief complaint Neurologic: Sensation intact distally Psychiatric: Patient is competent for consent with normal mood and affect Lymphatic: No axillary or cervical lymphadenopathy   Assessment/Plan: L1 OSTEOPOROTIC COMPRESSION FRACTURE Plan for Procedure(s): LUMBAR 1 KYPHOPLASTY   Jackelyn Hoehn, MD 04/25/2023 7:22 AM

## 2023-04-26 ENCOUNTER — Encounter (HOSPITAL_COMMUNITY): Payer: Self-pay | Admitting: Orthopedic Surgery

## 2023-04-28 DIAGNOSIS — Z48815 Encounter for surgical aftercare following surgery on the digestive system: Secondary | ICD-10-CM | POA: Diagnosis not present

## 2023-04-28 DIAGNOSIS — E1122 Type 2 diabetes mellitus with diabetic chronic kidney disease: Secondary | ICD-10-CM | POA: Diagnosis not present

## 2023-04-28 DIAGNOSIS — M170 Bilateral primary osteoarthritis of knee: Secondary | ICD-10-CM | POA: Diagnosis not present

## 2023-04-28 DIAGNOSIS — I509 Heart failure, unspecified: Secondary | ICD-10-CM | POA: Diagnosis not present

## 2023-04-28 DIAGNOSIS — N1832 Chronic kidney disease, stage 3b: Secondary | ICD-10-CM | POA: Diagnosis not present

## 2023-04-28 DIAGNOSIS — I130 Hypertensive heart and chronic kidney disease with heart failure and stage 1 through stage 4 chronic kidney disease, or unspecified chronic kidney disease: Secondary | ICD-10-CM | POA: Diagnosis not present

## 2023-05-01 ENCOUNTER — Encounter: Payer: Self-pay | Admitting: Orthopedic Surgery

## 2023-05-02 DIAGNOSIS — N1832 Chronic kidney disease, stage 3b: Secondary | ICD-10-CM | POA: Diagnosis not present

## 2023-05-02 DIAGNOSIS — E1122 Type 2 diabetes mellitus with diabetic chronic kidney disease: Secondary | ICD-10-CM | POA: Diagnosis not present

## 2023-05-02 DIAGNOSIS — I130 Hypertensive heart and chronic kidney disease with heart failure and stage 1 through stage 4 chronic kidney disease, or unspecified chronic kidney disease: Secondary | ICD-10-CM | POA: Diagnosis not present

## 2023-05-02 DIAGNOSIS — Z48815 Encounter for surgical aftercare following surgery on the digestive system: Secondary | ICD-10-CM | POA: Diagnosis not present

## 2023-05-02 DIAGNOSIS — M170 Bilateral primary osteoarthritis of knee: Secondary | ICD-10-CM | POA: Diagnosis not present

## 2023-05-02 DIAGNOSIS — I509 Heart failure, unspecified: Secondary | ICD-10-CM | POA: Diagnosis not present

## 2023-05-03 DIAGNOSIS — I509 Heart failure, unspecified: Secondary | ICD-10-CM | POA: Diagnosis not present

## 2023-05-03 DIAGNOSIS — N1832 Chronic kidney disease, stage 3b: Secondary | ICD-10-CM | POA: Diagnosis not present

## 2023-05-03 DIAGNOSIS — Z48815 Encounter for surgical aftercare following surgery on the digestive system: Secondary | ICD-10-CM | POA: Diagnosis not present

## 2023-05-03 DIAGNOSIS — M170 Bilateral primary osteoarthritis of knee: Secondary | ICD-10-CM | POA: Diagnosis not present

## 2023-05-03 DIAGNOSIS — E1122 Type 2 diabetes mellitus with diabetic chronic kidney disease: Secondary | ICD-10-CM | POA: Diagnosis not present

## 2023-05-03 DIAGNOSIS — I130 Hypertensive heart and chronic kidney disease with heart failure and stage 1 through stage 4 chronic kidney disease, or unspecified chronic kidney disease: Secondary | ICD-10-CM | POA: Diagnosis not present

## 2023-05-06 ENCOUNTER — Encounter: Payer: Self-pay | Admitting: Internal Medicine

## 2023-05-09 DIAGNOSIS — Z48815 Encounter for surgical aftercare following surgery on the digestive system: Secondary | ICD-10-CM | POA: Diagnosis not present

## 2023-05-09 DIAGNOSIS — E1122 Type 2 diabetes mellitus with diabetic chronic kidney disease: Secondary | ICD-10-CM | POA: Diagnosis not present

## 2023-05-09 DIAGNOSIS — I130 Hypertensive heart and chronic kidney disease with heart failure and stage 1 through stage 4 chronic kidney disease, or unspecified chronic kidney disease: Secondary | ICD-10-CM | POA: Diagnosis not present

## 2023-05-09 DIAGNOSIS — I509 Heart failure, unspecified: Secondary | ICD-10-CM | POA: Diagnosis not present

## 2023-05-09 DIAGNOSIS — M170 Bilateral primary osteoarthritis of knee: Secondary | ICD-10-CM | POA: Diagnosis not present

## 2023-05-09 DIAGNOSIS — N1832 Chronic kidney disease, stage 3b: Secondary | ICD-10-CM | POA: Diagnosis not present

## 2023-05-10 DIAGNOSIS — Z48815 Encounter for surgical aftercare following surgery on the digestive system: Secondary | ICD-10-CM | POA: Diagnosis not present

## 2023-05-10 DIAGNOSIS — E1122 Type 2 diabetes mellitus with diabetic chronic kidney disease: Secondary | ICD-10-CM | POA: Diagnosis not present

## 2023-05-10 DIAGNOSIS — I130 Hypertensive heart and chronic kidney disease with heart failure and stage 1 through stage 4 chronic kidney disease, or unspecified chronic kidney disease: Secondary | ICD-10-CM | POA: Diagnosis not present

## 2023-05-10 DIAGNOSIS — N1832 Chronic kidney disease, stage 3b: Secondary | ICD-10-CM | POA: Diagnosis not present

## 2023-05-10 DIAGNOSIS — I509 Heart failure, unspecified: Secondary | ICD-10-CM | POA: Diagnosis not present

## 2023-05-10 DIAGNOSIS — M170 Bilateral primary osteoarthritis of knee: Secondary | ICD-10-CM | POA: Diagnosis not present

## 2023-05-12 DIAGNOSIS — I509 Heart failure, unspecified: Secondary | ICD-10-CM | POA: Diagnosis not present

## 2023-05-12 DIAGNOSIS — E875 Hyperkalemia: Secondary | ICD-10-CM | POA: Diagnosis not present

## 2023-05-12 DIAGNOSIS — I051 Rheumatic mitral insufficiency: Secondary | ICD-10-CM | POA: Diagnosis not present

## 2023-05-12 DIAGNOSIS — D631 Anemia in chronic kidney disease: Secondary | ICD-10-CM | POA: Diagnosis not present

## 2023-05-12 DIAGNOSIS — I480 Paroxysmal atrial fibrillation: Secondary | ICD-10-CM | POA: Diagnosis not present

## 2023-05-12 DIAGNOSIS — E785 Hyperlipidemia, unspecified: Secondary | ICD-10-CM | POA: Diagnosis not present

## 2023-05-12 DIAGNOSIS — I890 Lymphedema, not elsewhere classified: Secondary | ICD-10-CM | POA: Diagnosis not present

## 2023-05-12 DIAGNOSIS — I872 Venous insufficiency (chronic) (peripheral): Secondary | ICD-10-CM | POA: Diagnosis not present

## 2023-05-12 DIAGNOSIS — N281 Cyst of kidney, acquired: Secondary | ICD-10-CM | POA: Diagnosis not present

## 2023-05-12 DIAGNOSIS — K5730 Diverticulosis of large intestine without perforation or abscess without bleeding: Secondary | ICD-10-CM | POA: Diagnosis not present

## 2023-05-12 DIAGNOSIS — M069 Rheumatoid arthritis, unspecified: Secondary | ICD-10-CM | POA: Diagnosis not present

## 2023-05-12 DIAGNOSIS — N1832 Chronic kidney disease, stage 3b: Secondary | ICD-10-CM | POA: Diagnosis not present

## 2023-05-12 DIAGNOSIS — I050 Rheumatic mitral stenosis: Secondary | ICD-10-CM | POA: Diagnosis not present

## 2023-05-12 DIAGNOSIS — H9193 Unspecified hearing loss, bilateral: Secondary | ICD-10-CM | POA: Diagnosis not present

## 2023-05-12 DIAGNOSIS — E1122 Type 2 diabetes mellitus with diabetic chronic kidney disease: Secondary | ICD-10-CM | POA: Diagnosis not present

## 2023-05-12 DIAGNOSIS — I700 Atherosclerosis of aorta: Secondary | ICD-10-CM | POA: Diagnosis not present

## 2023-05-12 DIAGNOSIS — M810 Age-related osteoporosis without current pathological fracture: Secondary | ICD-10-CM | POA: Diagnosis not present

## 2023-05-12 DIAGNOSIS — I2510 Atherosclerotic heart disease of native coronary artery without angina pectoris: Secondary | ICD-10-CM | POA: Diagnosis not present

## 2023-05-12 DIAGNOSIS — E1151 Type 2 diabetes mellitus with diabetic peripheral angiopathy without gangrene: Secondary | ICD-10-CM | POA: Diagnosis not present

## 2023-05-12 DIAGNOSIS — M1990 Unspecified osteoarthritis, unspecified site: Secondary | ICD-10-CM | POA: Diagnosis not present

## 2023-05-12 DIAGNOSIS — M170 Bilateral primary osteoarthritis of knee: Secondary | ICD-10-CM | POA: Diagnosis not present

## 2023-05-12 DIAGNOSIS — Z48815 Encounter for surgical aftercare following surgery on the digestive system: Secondary | ICD-10-CM | POA: Diagnosis not present

## 2023-05-12 DIAGNOSIS — E1142 Type 2 diabetes mellitus with diabetic polyneuropathy: Secondary | ICD-10-CM | POA: Diagnosis not present

## 2023-05-12 DIAGNOSIS — K219 Gastro-esophageal reflux disease without esophagitis: Secondary | ICD-10-CM | POA: Diagnosis not present

## 2023-05-12 DIAGNOSIS — I130 Hypertensive heart and chronic kidney disease with heart failure and stage 1 through stage 4 chronic kidney disease, or unspecified chronic kidney disease: Secondary | ICD-10-CM | POA: Diagnosis not present

## 2023-05-13 ENCOUNTER — Ambulatory Visit (INDEPENDENT_AMBULATORY_CARE_PROVIDER_SITE_OTHER): Payer: Medicare Other | Admitting: Orthopedic Surgery

## 2023-05-13 ENCOUNTER — Ambulatory Visit (INDEPENDENT_AMBULATORY_CARE_PROVIDER_SITE_OTHER): Payer: Medicare Other

## 2023-05-13 ENCOUNTER — Encounter: Payer: Self-pay | Admitting: Orthopedic Surgery

## 2023-05-13 DIAGNOSIS — I87332 Chronic venous hypertension (idiopathic) with ulcer and inflammation of left lower extremity: Secondary | ICD-10-CM | POA: Diagnosis not present

## 2023-05-13 DIAGNOSIS — S81802D Unspecified open wound, left lower leg, subsequent encounter: Secondary | ICD-10-CM | POA: Diagnosis not present

## 2023-05-13 DIAGNOSIS — N1832 Chronic kidney disease, stage 3b: Secondary | ICD-10-CM | POA: Diagnosis not present

## 2023-05-13 DIAGNOSIS — I4819 Other persistent atrial fibrillation: Secondary | ICD-10-CM

## 2023-05-13 DIAGNOSIS — M170 Bilateral primary osteoarthritis of knee: Secondary | ICD-10-CM | POA: Diagnosis not present

## 2023-05-13 DIAGNOSIS — I130 Hypertensive heart and chronic kidney disease with heart failure and stage 1 through stage 4 chronic kidney disease, or unspecified chronic kidney disease: Secondary | ICD-10-CM | POA: Diagnosis not present

## 2023-05-13 DIAGNOSIS — I509 Heart failure, unspecified: Secondary | ICD-10-CM | POA: Diagnosis not present

## 2023-05-13 DIAGNOSIS — Z48815 Encounter for surgical aftercare following surgery on the digestive system: Secondary | ICD-10-CM | POA: Diagnosis not present

## 2023-05-13 DIAGNOSIS — E1122 Type 2 diabetes mellitus with diabetic chronic kidney disease: Secondary | ICD-10-CM | POA: Diagnosis not present

## 2023-05-13 LAB — CUP PACEART REMOTE DEVICE CHECK
Date Time Interrogation Session: 20250309232106
Implantable Pulse Generator Implant Date: 20240708

## 2023-05-13 NOTE — Progress Notes (Signed)
 Office Visit Note   Patient: Shelby Walker           Date of Birth: [birth year]           MRN: [id number] Visit Date: 05/13/2023              Requested by: Adrian Prince, MD [address] [place],  [place] [address] PCP: Adrian Prince, MD  Chief Complaint  Patient presents with   Left Foot - Wound Check      HPI: Patient is an 89 year old woman who presents with ulcerations both lower extremities.  She is status post a right transmetatarsal amputation has developed a new wound laterally.  Patient has a chronic left great toe ulcer over the first MTP joint.  She is using a nitroglycerin patch.  Assessment & Plan: Visit Diagnoses:  1. Wound of left lower extremity, subsequent encounter   2. Chronic venous hypertension (idiopathic) with ulcer and inflammation of left lower extremity (HCC)     Plan: Continue protective shoe wear with custom orthotics.  Use a Band-Aid over the base of the fifth metatarsal ulcer on the right minimize use of her shoewear.  Continue with the nitroglycerin patch on the left.  Follow-Up Instructions: Return in about 4 weeks (around 06/10/2023).   Ortho Exam  Patient is alert, oriented, no adenopathy, well-dressed, normal affect, normal respiratory effort. Examination the right foot she has developed a new pressure ulcer over the base of the fifth metatarsal.  The wound is about 3 mm in diameter superficial does not probe to bone or tendon no cellulitis.  Examination of the left foot she has a 2 mm ulcer over the medial aspect of the first metatarsal head there is no cellulitis in either foot.  Imaging: No results found. No images are attached to the encounter.  Labs: Lab Results  Component Value Date   HGBA1C 9.2 (H) 04/04/2023   HGBA1C 9.2 (H) 08/08/2022   HGBA1C 7.3 (H) 01/24/2022   ESRSEDRATE 31 (H) 05/01/2022   ESRSEDRATE 22 03/27/2022   ESRSEDRATE 18 02/09/2022   CRP 8.0 (H) 08/12/2022   CRP 8.1 (H) 08/11/2022   CRP 8.5 (H) 08/10/2022    REPTSTATUS 08/10/2022 FINAL 08/07/2022   CULT (A) 08/07/2022    ENTEROBACTER CLOACAE SUSCEPTIBILITIES PERFORMED ON PREVIOUS CULTURE WITHIN THE LAST 5 DAYS. Performed at [facility], [address] [place], [place] [address]    LABORGA ENTEROBACTER CLOACAE 08/07/2022     Lab Results  Component Value Date   ALBUMIN 2.8 (L) 04/05/2023   ALBUMIN 3.4 (L) 04/04/2023   ALBUMIN 3.3 (L) 04/03/2023   PREALBUMIN 18 02/05/2022   PREALBUMIN 15.2 (L) 01/16/2021    Lab Results  Component Value Date   MG 2.4 04/05/2023   MG 2.3 08/12/2022   MG 2.3 08/11/2022   No results found for: "VD25OH"  Lab Results  Component Value Date   PREALBUMIN 18 02/05/2022   PREALBUMIN 15.2 (L) 01/16/2021      Latest Ref Rng & Units 04/05/2023    5:20 AM 04/04/2023    4:14 AM 04/03/2023    6:23 PM  CBC EXTENDED  WBC 4.0 - 10.5 K/uL 10.8  9.9  11.4   RBC 3.87 - 5.11 MIL/uL 3.31  3.85  3.70   Hemoglobin 12.0 - 15.0 g/dL 82.9  56.2  13.0   HCT 36.0 - 46.0 % 32.1  37.8  36.1   Platelets 150 - 400 K/uL 177  209  227   NEUT#  1.7 - 7.7 K/uL  6.8  9.5   Lymph# 0.7 - 4.0 K/uL  1.7  1.2      There is no height or weight on file to calculate BMI.  Orders:  No orders of the defined types were placed in this encounter.  No orders of the defined types were placed in this encounter.    Procedures: No procedures performed  Clinical Data: No additional findings.  ROS:  All other systems negative, except as noted in the HPI. Review of Systems  Objective: Vital Signs: There were no vitals taken for this visit.  Specialty Comments:  EXAM: MRI LUMBAR SPINE WITHOUT CONTRAST   TECHNIQUE: Multiplanar, multisequence MR imaging of the lumbar spine was performed. No intravenous contrast was administered.   COMPARISON:  RADIOGRAPHY OF THE LUMBAR DATED 11/16/2020. RADIOGRAPHY OF THE LUMBAR DATED 08/07/2018.   FINDINGS: Segmentation:  Standard.   Alignment: Grade 1 anterolisthesis of L3 on L4, L4  on L5 and L5-S1 secondary to facet disease.   Vertebrae: No acute fracture, evidence of discitis, or aggressive bone lesion.   Conus medullaris and cauda equina: Conus extends to the L1-2 level. Conus and cauda equina appear normal.   Paraspinal and other soft tissues: No acute paraspinal abnormality. Right renal cyst.   Disc levels:   Disc spaces: Degenerative disease with disc height loss at L3-4, L4-5 and L5-S1.   T12-L1: Minimal broad-based disc bulge. No foraminal or central canal stenosis.   L1-L2: No significant disc bulge. No neural foraminal stenosis. No central canal stenosis.   L2-L3: Minimal broad-based disc bulge. Mild bilateral facet arthropathy. No foraminal or central canal stenosis.   L3-L4: Broad-based disc bulge with a broad left paracentral disc protrusion with mass effect on the left intraspinal L4 nerve root. Moderate bilateral facet arthropathy. Moderate-severe spinal stenosis. Left subarticular recess stenosis. Moderate left foraminal stenosis. Mild right foraminal stenosis.   L4-L5: Broad-based disc bulge with a broad central disc protrusion. Severe bilateral facet arthropathy with ligamentum flavum infolding. Severe spinal stenosis. Mild right and severe left foraminal stenosis.   L5-S1: Mild broad-based disc bulge with a broad shallow right paracentral disc protrusion with mass effect on the right intraspinal S1 nerve root. Mild bilateral foraminal stenosis. Mild bilateral facet arthropathy. No spinal stenosis.   IMPRESSION: 1. Lumbar spine spondylosis as described above most severe at L3-4, L4-5 and L5-S1. 2. No acute osseous injury of the lumbar spine.     Electronically Signed   By: Elige Ko M.D.   On: 11/23/2020 08:11  PMFS History: Patient Active Problem List   Diagnosis Date Noted   Elevated LFTs 04/04/2023   Anemia 04/04/2023   Choledocholithiasis 04/03/2023   Acute biliary pancreatitis 08/07/2022   Sepsis (HCC)  08/07/2022   Sacral wound 05/14/2022   Coronary artery disease involving native coronary artery of native heart without angina pectoris 02/21/2022   Discitis of lumbar region 02/05/2022   Herpes zoster without complication 02/05/2022   Clostridium difficile colitis 02/05/2022   Severe sepsis with acute organ dysfunction due to Gram positive bacteria (HCC) 02/02/2022   Pacemaker infection (HCC) 02/01/2022   Periostitis of lumbar spine without osteomyelitis (HCC) 02/01/2022   Acute midline low back pain without sciatica 01/26/2022   MSSA bacteremia 01/25/2022   Presence of cardiac pacemaker 01/25/2022   Severe sepsis with septic shock (HCC) 01/24/2022   AKI (acute kidney injury) (HCC) 01/24/2022   Sinus pause 01/04/2022   Acute viral bronchitis 02/06/2021   Acute blood loss anemia  Postoperative pain    S/P transmetatarsal amputation of foot, right (HCC) 01/24/2021   Protein-calorie malnutrition, severe 01/17/2021   Diabetic foot infection (HCC) 01/16/2021   Rheumatoid arthritis (HCC) 01/16/2021   Stage 3b chronic kidney disease (CKD) (HCC) 01/16/2021   DNR (do not resuscitate) 01/16/2021   Decreased anal sphincter tone 12/13/2018   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   Irritable bowel syndrome with diarrhea - post infectious 12/12/2018   Full incontinence of feces and fecal smearing 12/12/2018   Perianal dermatitis 12/12/2018   Uncontrolled type 2 diabetes mellitus with hyperglycemia (HCC) 10/26/2018   Long-term use of immunosuppressant medication-Enbrel 10/26/2018   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 08/27/2018   Thrombocytopenia (HCC)    Essential hypertension    Dyspnea on exertion    Steroid-induced hyperglycemia    Fatigue 08/06/2018   Paroxysmal atrial fibrillation (HCC)    Mixed hyperlipidemia 09/29/2016   Stable angina (HCC) 09/29/2016   PAD (peripheral artery disease) (HCC) 08/09/2016   Preoperative clearance 05/21/2012   Precordial chest pain     Palpitations    Diabetes mellitus (HCC)    GERD (gastroesophageal reflux disease)    Ejection fraction    Carotid artery disease (HCC)    Mitral regurgitation 04/21/2009   PSORIASIS 02/09/2009   Rheumatoid arthritis flare (HCC) 02/09/2009   Past Medical History:  Diagnosis Date   Anemia    Bruit    Carotid Doppler showed no significant abnormality     9per patient)   C. difficile colitis 07/2018   with severe sepsis   Chest pain, unspecified    Nuclear, May, 2008, no scar or ischemia   Chronic kidney disease    Diabetes mellitus    Diverticulosis    Dyslipidemia    Dysrhythmia    a-fib   Ejection fraction    EF 55-60%, echo, February, 2011 // Echocardiogram 8/21: EF 60-65, no RWMA, Gr 1 DD, GLS -14%, normal RVSF, mild LAE, trivial MR, mild MS (mean 4 mmHg), RVSP 23.4    GERD (gastroesophageal reflux disease)    History of kidney stones    removed   HLD (hyperlipidemia)    Implantable loop recorder present    Memory loss    Mitral regurgitation 04/21/2009   mild,  echo, February, 2011   Osteoporosis    Palpitations    possible very brief atrial fibrillation on monitor and possible reentrant tachycardia   Pneumonia    PONV (postoperative nausea and vomiting)    Psoriasis    Rheumatoid arthritis (HCC)     Family History  Problem Relation Age of Onset   Heart failure Father    Heart attack Father    Diabetes Father    Heart attack Mother    Heart failure Mother    Diabetes Mother    Stroke Sister     Past Surgical History:  Procedure Laterality Date   ABDOMINAL AORTOGRAM W/LOWER EXTREMITY Right 10/19/2020   Procedure: ABDOMINAL AORTOGRAM W/LOWER EXTREMITY;  Surgeon: Iran Ouch, MD;  Location: [facility];  Service: Cardiovascular;  Laterality: Right;   CHOLECYSTECTOMY N/A 08/09/2022   Procedure: LAPAROSCOPIC CHOLECYSTECTOMY WITH INTRAOPERATIVE CHOLANGIOGRAM;  Surgeon: Quentin Ore, MD;  Location: [facility] OR;  Service: General;  Laterality: N/A;    ENDOSCOPIC RETROGRADE CHOLANGIOPANCREATOGRAPHY (ERCP) WITH PROPOFOL N/A 04/05/2023   Procedure: ENDOSCOPIC RETROGRADE CHOLANGIOPANCREATOGRAPHY (ERCP) WITH PROPOFOL;  Surgeon: Hilarie Fredrickson, MD;  Location: [facility];  Service: Gastroenterology;  Laterality: N/A;   FRACTURE SURGERY     KYPHOPLASTY N/A  04/25/2023   Procedure: LUMBAR 1 KYPHOPLASTY;  Surgeon: Estill Bamberg, MD;  Location: [facility] OR;  Service: Orthopedics;  Laterality: N/A;   LOOP Recorder placement     LOOP RECORDER REMOVAL N/A 01/05/2022   Procedure: LOOP RECORDER REMOVAL;  Surgeon: Marinus Maw, MD;  Location: [facility];  Service: Cardiovascular;  Laterality: N/A;   PACEMAKER IMPLANT N/A 01/05/2022   Procedure: [employer] PACEMAKER IMPLANT;  Surgeon: Marinus Maw, MD;  Location: [facility];  Service: Cardiovascular;  Laterality: N/A;   PPM GENERATOR REMOVAL N/A 01/29/2022   Procedure: PPM GENERATOR REMOVAL;  Surgeon: Marinus Maw, MD;  Location: [facility];  Service: Cardiovascular;  Laterality: N/A;   REMOVAL OF STONES  04/05/2023   Procedure: REMOVAL OF STONES;  Surgeon: Hilarie Fredrickson, MD;  Location: [facility];  Service: Gastroenterology;;   Dennison Mascot  04/05/2023   Procedure: Dennison Mascot;  Surgeon: Hilarie Fredrickson, MD;  Location: [facility];  Service: Gastroenterology;;   TEE WITHOUT CARDIOVERSION N/A 01/29/2022   Procedure: TRANSESOPHAGEAL ECHOCARDIOGRAM (TEE);  Surgeon: Sande Rives, MD;  Location: [facility];  Service: Cardiovascular;  Laterality: N/A;   TRANSMETATARSAL AMPUTATION Right 01/18/2021   Procedure: TRANSMETATARSAL AMPUTATION;  Surgeon: Toni Arthurs, MD;  Location: [facility] OR;  Service: Orthopedics;  Laterality: Right;   Social History   Occupational History    Employer: RETIRED  Tobacco Use   Smoking status: Never   Smokeless tobacco: Never  Vaping Use   Vaping status: Never Used  Substance and Sexual Activity   Alcohol use: No    Alcohol/week: 0.0  standard drinks of alcohol   Drug use: No   Sexual activity: Yes    Birth control/protection: Post-menopausal

## 2023-05-15 ENCOUNTER — Other Ambulatory Visit: Payer: Self-pay | Admitting: Endocrinology

## 2023-05-15 ENCOUNTER — Encounter: Payer: Self-pay | Admitting: Endocrinology

## 2023-05-15 DIAGNOSIS — E1122 Type 2 diabetes mellitus with diabetic chronic kidney disease: Secondary | ICD-10-CM | POA: Diagnosis not present

## 2023-05-15 DIAGNOSIS — N1832 Chronic kidney disease, stage 3b: Secondary | ICD-10-CM | POA: Diagnosis not present

## 2023-05-15 DIAGNOSIS — Z48815 Encounter for surgical aftercare following surgery on the digestive system: Secondary | ICD-10-CM | POA: Diagnosis not present

## 2023-05-15 DIAGNOSIS — E559 Vitamin D deficiency, unspecified: Secondary | ICD-10-CM

## 2023-05-15 DIAGNOSIS — I130 Hypertensive heart and chronic kidney disease with heart failure and stage 1 through stage 4 chronic kidney disease, or unspecified chronic kidney disease: Secondary | ICD-10-CM | POA: Diagnosis not present

## 2023-05-15 DIAGNOSIS — M170 Bilateral primary osteoarthritis of knee: Secondary | ICD-10-CM | POA: Diagnosis not present

## 2023-05-15 DIAGNOSIS — I509 Heart failure, unspecified: Secondary | ICD-10-CM | POA: Diagnosis not present

## 2023-05-15 NOTE — Progress Notes (Signed)
 [facility] Summary Report / Loop Recorder

## 2023-05-20 DIAGNOSIS — Z48815 Encounter for surgical aftercare following surgery on the digestive system: Secondary | ICD-10-CM | POA: Diagnosis not present

## 2023-05-20 DIAGNOSIS — N1832 Chronic kidney disease, stage 3b: Secondary | ICD-10-CM | POA: Diagnosis not present

## 2023-05-20 DIAGNOSIS — I130 Hypertensive heart and chronic kidney disease with heart failure and stage 1 through stage 4 chronic kidney disease, or unspecified chronic kidney disease: Secondary | ICD-10-CM | POA: Diagnosis not present

## 2023-05-20 DIAGNOSIS — E1122 Type 2 diabetes mellitus with diabetic chronic kidney disease: Secondary | ICD-10-CM | POA: Diagnosis not present

## 2023-05-20 DIAGNOSIS — I509 Heart failure, unspecified: Secondary | ICD-10-CM | POA: Diagnosis not present

## 2023-05-20 DIAGNOSIS — M170 Bilateral primary osteoarthritis of knee: Secondary | ICD-10-CM | POA: Diagnosis not present

## 2023-05-21 DIAGNOSIS — I509 Heart failure, unspecified: Secondary | ICD-10-CM | POA: Diagnosis not present

## 2023-05-21 DIAGNOSIS — I130 Hypertensive heart and chronic kidney disease with heart failure and stage 1 through stage 4 chronic kidney disease, or unspecified chronic kidney disease: Secondary | ICD-10-CM | POA: Diagnosis not present

## 2023-05-21 DIAGNOSIS — M170 Bilateral primary osteoarthritis of knee: Secondary | ICD-10-CM | POA: Diagnosis not present

## 2023-05-21 DIAGNOSIS — N1832 Chronic kidney disease, stage 3b: Secondary | ICD-10-CM | POA: Diagnosis not present

## 2023-05-21 DIAGNOSIS — E1122 Type 2 diabetes mellitus with diabetic chronic kidney disease: Secondary | ICD-10-CM | POA: Diagnosis not present

## 2023-05-21 DIAGNOSIS — Z48815 Encounter for surgical aftercare following surgery on the digestive system: Secondary | ICD-10-CM | POA: Diagnosis not present

## 2023-05-23 ENCOUNTER — Telehealth: Payer: Self-pay

## 2023-05-23 ENCOUNTER — Telehealth: Payer: Self-pay | Admitting: Orthopedic Surgery

## 2023-05-23 DIAGNOSIS — M170 Bilateral primary osteoarthritis of knee: Secondary | ICD-10-CM | POA: Diagnosis not present

## 2023-05-23 DIAGNOSIS — N1832 Chronic kidney disease, stage 3b: Secondary | ICD-10-CM | POA: Diagnosis not present

## 2023-05-23 DIAGNOSIS — Z48815 Encounter for surgical aftercare following surgery on the digestive system: Secondary | ICD-10-CM | POA: Diagnosis not present

## 2023-05-23 DIAGNOSIS — I509 Heart failure, unspecified: Secondary | ICD-10-CM | POA: Diagnosis not present

## 2023-05-23 DIAGNOSIS — E1122 Type 2 diabetes mellitus with diabetic chronic kidney disease: Secondary | ICD-10-CM | POA: Diagnosis not present

## 2023-05-23 DIAGNOSIS — I130 Hypertensive heart and chronic kidney disease with heart failure and stage 1 through stage 4 chronic kidney disease, or unspecified chronic kidney disease: Secondary | ICD-10-CM | POA: Diagnosis not present

## 2023-05-23 NOTE — Telephone Encounter (Signed)
 SW dtr, [name]. I have scheduled pt tomorrow morning at 9 am for follow up.

## 2023-05-23 NOTE — Telephone Encounter (Signed)
 Vernona Rieger called back to ask for orders. Morrie Sheldon gave them to her. I have been in clinic with Dr. Lajoyce Corners so wasn't able to call her back immediately. I SW dtr and pt is coming in tomorrow for a follow up.

## 2023-05-23 NOTE — Telephone Encounter (Signed)
 Vernona Rieger, RN w/ [facility] asking for wound care orders. Pt has a dime size ulcer on 5th MT and asking if she can apply aquacel or have pt use gentamicin that she was on prior? She says the ulcer has a lot of purulent drainage coming out of it and is concerned it is getting worse.  Callback [phone]

## 2023-05-23 NOTE — Telephone Encounter (Signed)
 [facility] called back again asking for wound care orders, I read the message from [employer] and she asked if we can call her daughter to see if she can come in  She said it was about a dime size drainage

## 2023-05-24 ENCOUNTER — Telehealth: Payer: Self-pay | Admitting: Family

## 2023-05-24 ENCOUNTER — Ambulatory Visit: Admitting: Family

## 2023-05-24 DIAGNOSIS — S81802D Unspecified open wound, left lower leg, subsequent encounter: Secondary | ICD-10-CM

## 2023-05-24 DIAGNOSIS — I87332 Chronic venous hypertension (idiopathic) with ulcer and inflammation of left lower extremity: Secondary | ICD-10-CM | POA: Diagnosis not present

## 2023-05-24 MED ORDER — CEPHALEXIN 500 MG PO CAPS
500.0000 mg | ORAL_CAPSULE | Freq: Three times a day (TID) | ORAL | 0 refills | Status: AC
Start: 2023-05-24 — End: 2023-06-03

## 2023-05-24 NOTE — Telephone Encounter (Signed)
 Pt's daughter [name] states the Rx for the antibiotic is not at the [facility]. She would like to pick it up ASAP because she will not be in town for the weekend and wants to make sure that her mom has it if needed,    Please call [phone]

## 2023-05-24 NOTE — Telephone Encounter (Signed)
 [name] informed

## 2023-05-24 NOTE — Telephone Encounter (Signed)
 Keflex sent in to [facility]. I will notify dtr.

## 2023-05-24 NOTE — Telephone Encounter (Signed)
 Shelby Walker is still in clinic, she will send in this morning.

## 2023-05-27 DIAGNOSIS — I509 Heart failure, unspecified: Secondary | ICD-10-CM | POA: Diagnosis not present

## 2023-05-27 DIAGNOSIS — Z48815 Encounter for surgical aftercare following surgery on the digestive system: Secondary | ICD-10-CM | POA: Diagnosis not present

## 2023-05-27 DIAGNOSIS — I130 Hypertensive heart and chronic kidney disease with heart failure and stage 1 through stage 4 chronic kidney disease, or unspecified chronic kidney disease: Secondary | ICD-10-CM | POA: Diagnosis not present

## 2023-05-27 DIAGNOSIS — M170 Bilateral primary osteoarthritis of knee: Secondary | ICD-10-CM | POA: Diagnosis not present

## 2023-05-27 DIAGNOSIS — E1122 Type 2 diabetes mellitus with diabetic chronic kidney disease: Secondary | ICD-10-CM | POA: Diagnosis not present

## 2023-05-27 DIAGNOSIS — N1832 Chronic kidney disease, stage 3b: Secondary | ICD-10-CM | POA: Diagnosis not present

## 2023-05-31 ENCOUNTER — Encounter: Payer: Self-pay | Admitting: Family

## 2023-05-31 NOTE — Progress Notes (Signed)
 Office Visit Note   Patient: Shelby Walker           Date of Birth: [birth year]           MRN: [id number] Visit Date: 05/24/2023              Requested by: Adrian Prince, MD [address] [place],  [place] [address] PCP: Adrian Prince, MD  Chief Complaint  Patient presents with   Left Foot - Wound Check      HPI: The patient is an 89 year old woman who presents today as a work in.  Home health nursing was concerned that she was having purulent drainage from the lateral foot ulcer on the right  This has since dried up.  Her daughter is no longer concerns today  Assessment & Plan: Visit Diagnoses: No diagnosis found.  Plan: Provided felt pressure relieving pads to form an arc over the bony prominence.  Discussed ongoing management with wound care nitroglycerin patches have also placed an order for Keflex to have at the pharmacy should the purulence or erythema improve or worsen  Follow-Up Instructions: Return if symptoms worsen or fail to improve.   Ortho Exam  Patient is alert, oriented, no adenopathy, well-dressed, normal affect, normal respiratory effort. On examination right foot the ulcer over the lateral fifth metatarsal base is currently dry there is no open area appeared to have have an abscess which did freely drain.  There is tenderness without erythema or warmth no drainage  Imaging: No results found. No images are attached to the encounter.  Labs: Lab Results  Component Value Date   HGBA1C 9.2 (H) 04/04/2023   HGBA1C 9.2 (H) 08/08/2022   HGBA1C 7.3 (H) 01/24/2022   ESRSEDRATE 31 (H) 05/01/2022   ESRSEDRATE 22 03/27/2022   ESRSEDRATE 18 02/09/2022   CRP 8.0 (H) 08/12/2022   CRP 8.1 (H) 08/11/2022   CRP 8.5 (H) 08/10/2022   REPTSTATUS 08/10/2022 FINAL 08/07/2022   CULT (A) 08/07/2022    ENTEROBACTER CLOACAE SUSCEPTIBILITIES PERFORMED ON PREVIOUS CULTURE WITHIN THE LAST 5 DAYS. Performed at [facility], [address], [place], [place] [address]     LABORGA ENTEROBACTER CLOACAE 08/07/2022     Lab Results  Component Value Date   ALBUMIN 2.8 (L) 04/05/2023   ALBUMIN 3.4 (L) 04/04/2023   ALBUMIN 3.3 (L) 04/03/2023   PREALBUMIN 18 02/05/2022   PREALBUMIN 15.2 (L) 01/16/2021    Lab Results  Component Value Date   MG 2.4 04/05/2023   MG 2.3 08/12/2022   MG 2.3 08/11/2022   No results found for: "VD25OH"  Lab Results  Component Value Date   PREALBUMIN 18 02/05/2022   PREALBUMIN 15.2 (L) 01/16/2021      Latest Ref Rng & Units 04/05/2023    5:20 AM 04/04/2023    4:14 AM 04/03/2023    6:23 PM  CBC EXTENDED  WBC 4.0 - 10.5 K/uL 10.8  9.9  11.4   RBC 3.87 - 5.11 MIL/uL 3.31  3.85  3.70   Hemoglobin 12.0 - 15.0 g/dL 93.2  35.5  73.2   HCT 36.0 - 46.0 % 32.1  37.8  36.1   Platelets 150 - 400 K/uL 177  209  227   NEUT# 1.7 - 7.7 K/uL  6.8  9.5   Lymph# 0.7 - 4.0 K/uL  1.7  1.2      There is no height or weight on file to calculate BMI.  Orders:  No orders of the defined types  were placed in this encounter.  Meds ordered this encounter  Medications   cephALEXin (KEFLEX) 500 MG capsule    Sig: Take 1 capsule (500 mg total) by mouth 3 (three) times daily for 10 days.    Dispense:  30 capsule    Refill:  0     Procedures: No procedures performed  Clinical Data: No additional findings.  ROS:  All other systems negative, except as noted in the HPI. Review of Systems  Objective: Vital Signs: There were no vitals taken for this visit.  Specialty Comments:  EXAM: MRI LUMBAR SPINE WITHOUT CONTRAST   TECHNIQUE: Multiplanar, multisequence MR imaging of the lumbar spine was performed. No intravenous contrast was administered.   COMPARISON:  RADIOGRAPHY OF THE LUMBAR DATED 11/16/2020. RADIOGRAPHY OF THE LUMBAR DATED 08/07/2018.   FINDINGS: Segmentation:  Standard.   Alignment: Grade 1 anterolisthesis of L3 on L4, L4 on L5 and L5-S1 secondary to facet disease.   Vertebrae: No acute fracture, evidence of  discitis, or aggressive bone lesion.   Conus medullaris and cauda equina: Conus extends to the L1-2 level. Conus and cauda equina appear normal.   Paraspinal and other soft tissues: No acute paraspinal abnormality. Right renal cyst.   Disc levels:   Disc spaces: Degenerative disease with disc height loss at L3-4, L4-5 and L5-S1.   T12-L1: Minimal broad-based disc bulge. No foraminal or central canal stenosis.   L1-L2: No significant disc bulge. No neural foraminal stenosis. No central canal stenosis.   L2-L3: Minimal broad-based disc bulge. Mild bilateral facet arthropathy. No foraminal or central canal stenosis.   L3-L4: Broad-based disc bulge with a broad left paracentral disc protrusion with mass effect on the left intraspinal L4 nerve root. Moderate bilateral facet arthropathy. Moderate-severe spinal stenosis. Left subarticular recess stenosis. Moderate left foraminal stenosis. Mild right foraminal stenosis.   L4-L5: Broad-based disc bulge with a broad central disc protrusion. Severe bilateral facet arthropathy with ligamentum flavum infolding. Severe spinal stenosis. Mild right and severe left foraminal stenosis.   L5-S1: Mild broad-based disc bulge with a broad shallow right paracentral disc protrusion with mass effect on the right intraspinal S1 nerve root. Mild bilateral foraminal stenosis. Mild bilateral facet arthropathy. No spinal stenosis.   IMPRESSION: 1. Lumbar spine spondylosis as described above most severe at L3-4, L4-5 and L5-S1. 2. No acute osseous injury of the lumbar spine.     Electronically Signed   By: Elige Ko M.D.   On: 11/23/2020 08:11  PMFS History: Patient Active Problem List   Diagnosis Date Noted   Elevated LFTs 04/04/2023   Anemia 04/04/2023   Choledocholithiasis 04/03/2023   Acute biliary pancreatitis 08/07/2022   Sepsis (HCC) 08/07/2022   Sacral wound 05/14/2022   Coronary artery disease involving native coronary artery  of native heart without angina pectoris 02/21/2022   Discitis of lumbar region 02/05/2022   Herpes zoster without complication 02/05/2022   Clostridium difficile colitis 02/05/2022   Severe sepsis with acute organ dysfunction due to Gram positive bacteria (HCC) 02/02/2022   Pacemaker infection (HCC) 02/01/2022   Periostitis of lumbar spine without osteomyelitis (HCC) 02/01/2022   Acute midline low back pain without sciatica 01/26/2022   MSSA bacteremia 01/25/2022   Presence of cardiac pacemaker 01/25/2022   Severe sepsis with septic shock (HCC) 01/24/2022   AKI (acute kidney injury) (HCC) 01/24/2022   Sinus pause 01/04/2022   Acute viral bronchitis 02/06/2021   Acute blood loss anemia    Postoperative pain    S/P transmetatarsal  amputation of foot, right (HCC) 01/24/2021   Protein-calorie malnutrition, severe 01/17/2021   Diabetic foot infection (HCC) 01/16/2021   Rheumatoid arthritis (HCC) 01/16/2021   Stage 3b chronic kidney disease (CKD) (HCC) 01/16/2021   DNR (do not resuscitate) 01/16/2021   Decreased anal sphincter tone 12/13/2018   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   Irritable bowel syndrome with diarrhea - post infectious 12/12/2018   Full incontinence of feces and fecal smearing 12/12/2018   Perianal dermatitis 12/12/2018   Uncontrolled type 2 diabetes mellitus with hyperglycemia (HCC) 10/26/2018   Long-term use of immunosuppressant medication-Enbrel 10/26/2018   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 08/27/2018   Thrombocytopenia (HCC)    Essential hypertension    Dyspnea on exertion    Steroid-induced hyperglycemia    Fatigue 08/06/2018   Paroxysmal atrial fibrillation (HCC)    Mixed hyperlipidemia 09/29/2016   Stable angina (HCC) 09/29/2016   PAD (peripheral artery disease) (HCC) 08/09/2016   Preoperative clearance 05/21/2012   Precordial chest pain    Palpitations    Diabetes mellitus (HCC)    GERD (gastroesophageal reflux disease)    Ejection fraction     Carotid artery disease (HCC)    Mitral regurgitation 04/21/2009   PSORIASIS 02/09/2009   Rheumatoid arthritis flare (HCC) 02/09/2009   Past Medical History:  Diagnosis Date   Anemia    Bruit    Carotid Doppler showed no significant abnormality     9per patient)   C. difficile colitis 07/2018   with severe sepsis   Chest pain, unspecified    Nuclear, May, 2008, no scar or ischemia   Chronic kidney disease    Diabetes mellitus    Diverticulosis    Dyslipidemia    Dysrhythmia    a-fib   Ejection fraction    EF 55-60%, echo, February, 2011 // Echocardiogram 8/21: EF 60-65, no RWMA, Gr 1 DD, GLS -14%, normal RVSF, mild LAE, trivial MR, mild MS (mean 4 mmHg), RVSP 23.4    GERD (gastroesophageal reflux disease)    History of kidney stones    removed   HLD (hyperlipidemia)    Implantable loop recorder present    Memory loss    Mitral regurgitation 04/21/2009   mild,  echo, February, 2011   Osteoporosis    Palpitations    possible very brief atrial fibrillation on monitor and possible reentrant tachycardia   Pneumonia    PONV (postoperative nausea and vomiting)    Psoriasis    Rheumatoid arthritis (HCC)     Family History  Problem Relation Age of Onset   Heart failure Father    Heart attack Father    Diabetes Father    Heart attack Mother    Heart failure Mother    Diabetes Mother    Stroke Sister     Past Surgical History:  Procedure Laterality Date   ABDOMINAL AORTOGRAM W/LOWER EXTREMITY Right 10/19/2020   Procedure: ABDOMINAL AORTOGRAM W/LOWER EXTREMITY;  Surgeon: Iran Ouch, MD;  Location: [facility];  Service: Cardiovascular;  Laterality: Right;   CHOLECYSTECTOMY N/A 08/09/2022   Procedure: LAPAROSCOPIC CHOLECYSTECTOMY WITH INTRAOPERATIVE CHOLANGIOGRAM;  Surgeon: Quentin Ore, MD;  Location: [facility] OR;  Service: General;  Laterality: N/A;   ENDOSCOPIC RETROGRADE CHOLANGIOPANCREATOGRAPHY (ERCP) WITH PROPOFOL N/A 04/05/2023   Procedure:  ENDOSCOPIC RETROGRADE CHOLANGIOPANCREATOGRAPHY (ERCP) WITH PROPOFOL;  Surgeon: Hilarie Fredrickson, MD;  Location: [facility];  Service: Gastroenterology;  Laterality: N/A;   FRACTURE SURGERY     KYPHOPLASTY N/A 04/25/2023   Procedure: LUMBAR 1 KYPHOPLASTY;  Surgeon: Estill Bamberg, MD;  Location: [facility] OR;  Service: Orthopedics;  Laterality: N/A;   LOOP Recorder placement     LOOP RECORDER REMOVAL N/A 01/05/2022   Procedure: LOOP RECORDER REMOVAL;  Surgeon: Marinus Maw, MD;  Location: [facility];  Service: Cardiovascular;  Laterality: N/A;   PACEMAKER IMPLANT N/A 01/05/2022   Procedure: [employer] PACEMAKER IMPLANT;  Surgeon: Marinus Maw, MD;  Location: [facility];  Service: Cardiovascular;  Laterality: N/A;   PPM GENERATOR REMOVAL N/A 01/29/2022   Procedure: PPM GENERATOR REMOVAL;  Surgeon: Marinus Maw, MD;  Location: [facility];  Service: Cardiovascular;  Laterality: N/A;   REMOVAL OF STONES  04/05/2023   Procedure: REMOVAL OF STONES;  Surgeon: Hilarie Fredrickson, MD;  Location: [facility];  Service: Gastroenterology;;   Dennison Mascot  04/05/2023   Procedure: Dennison Mascot;  Surgeon: Hilarie Fredrickson, MD;  Location: [facility];  Service: Gastroenterology;;   TEE WITHOUT CARDIOVERSION N/A 01/29/2022   Procedure: TRANSESOPHAGEAL ECHOCARDIOGRAM (TEE);  Surgeon: Sande Rives, MD;  Location: [facility];  Service: Cardiovascular;  Laterality: N/A;   TRANSMETATARSAL AMPUTATION Right 01/18/2021   Procedure: TRANSMETATARSAL AMPUTATION;  Surgeon: Toni Arthurs, MD;  Location: [facility] OR;  Service: Orthopedics;  Laterality: Right;   Social History   Occupational History    Employer: RETIRED  Tobacco Use   Smoking status: Never   Smokeless tobacco: Never  Vaping Use   Vaping status: Never Used  Substance and Sexual Activity   Alcohol use: No    Alcohol/week: 0.0 standard drinks of alcohol   Drug use: No   Sexual activity: Yes    Birth control/protection:  Post-menopausal

## 2023-06-03 ENCOUNTER — Encounter: Payer: Self-pay | Admitting: Orthopedic Surgery

## 2023-06-03 DIAGNOSIS — N1832 Chronic kidney disease, stage 3b: Secondary | ICD-10-CM | POA: Diagnosis not present

## 2023-06-03 DIAGNOSIS — I129 Hypertensive chronic kidney disease with stage 1 through stage 4 chronic kidney disease, or unspecified chronic kidney disease: Secondary | ICD-10-CM | POA: Diagnosis not present

## 2023-06-03 DIAGNOSIS — E1140 Type 2 diabetes mellitus with diabetic neuropathy, unspecified: Secondary | ICD-10-CM | POA: Diagnosis not present

## 2023-06-03 DIAGNOSIS — I739 Peripheral vascular disease, unspecified: Secondary | ICD-10-CM | POA: Diagnosis not present

## 2023-06-03 DIAGNOSIS — I700 Atherosclerosis of aorta: Secondary | ICD-10-CM | POA: Diagnosis not present

## 2023-06-03 DIAGNOSIS — Z89431 Acquired absence of right foot: Secondary | ICD-10-CM | POA: Diagnosis not present

## 2023-06-03 DIAGNOSIS — Z794 Long term (current) use of insulin: Secondary | ICD-10-CM | POA: Diagnosis not present

## 2023-06-03 DIAGNOSIS — I25119 Atherosclerotic heart disease of native coronary artery with unspecified angina pectoris: Secondary | ICD-10-CM | POA: Diagnosis not present

## 2023-06-03 DIAGNOSIS — M069 Rheumatoid arthritis, unspecified: Secondary | ICD-10-CM | POA: Diagnosis not present

## 2023-06-03 DIAGNOSIS — S32010G Wedge compression fracture of first lumbar vertebra, subsequent encounter for fracture with delayed healing: Secondary | ICD-10-CM | POA: Diagnosis not present

## 2023-06-03 DIAGNOSIS — I480 Paroxysmal atrial fibrillation: Secondary | ICD-10-CM | POA: Diagnosis not present

## 2023-06-03 DIAGNOSIS — E785 Hyperlipidemia, unspecified: Secondary | ICD-10-CM | POA: Diagnosis not present

## 2023-06-04 DIAGNOSIS — E1122 Type 2 diabetes mellitus with diabetic chronic kidney disease: Secondary | ICD-10-CM | POA: Diagnosis not present

## 2023-06-04 DIAGNOSIS — Z48815 Encounter for surgical aftercare following surgery on the digestive system: Secondary | ICD-10-CM | POA: Diagnosis not present

## 2023-06-04 DIAGNOSIS — N1832 Chronic kidney disease, stage 3b: Secondary | ICD-10-CM | POA: Diagnosis not present

## 2023-06-04 DIAGNOSIS — I130 Hypertensive heart and chronic kidney disease with heart failure and stage 1 through stage 4 chronic kidney disease, or unspecified chronic kidney disease: Secondary | ICD-10-CM | POA: Diagnosis not present

## 2023-06-04 DIAGNOSIS — I509 Heart failure, unspecified: Secondary | ICD-10-CM | POA: Diagnosis not present

## 2023-06-04 DIAGNOSIS — M170 Bilateral primary osteoarthritis of knee: Secondary | ICD-10-CM | POA: Diagnosis not present

## 2023-06-05 ENCOUNTER — Ambulatory Visit (INDEPENDENT_AMBULATORY_CARE_PROVIDER_SITE_OTHER): Admitting: Family

## 2023-06-05 DIAGNOSIS — L97521 Non-pressure chronic ulcer of other part of left foot limited to breakdown of skin: Secondary | ICD-10-CM

## 2023-06-05 DIAGNOSIS — Z89431 Acquired absence of right foot: Secondary | ICD-10-CM | POA: Diagnosis not present

## 2023-06-05 DIAGNOSIS — S81802D Unspecified open wound, left lower leg, subsequent encounter: Secondary | ICD-10-CM | POA: Diagnosis not present

## 2023-06-06 DIAGNOSIS — N1832 Chronic kidney disease, stage 3b: Secondary | ICD-10-CM | POA: Diagnosis not present

## 2023-06-06 DIAGNOSIS — E1122 Type 2 diabetes mellitus with diabetic chronic kidney disease: Secondary | ICD-10-CM | POA: Diagnosis not present

## 2023-06-06 DIAGNOSIS — I509 Heart failure, unspecified: Secondary | ICD-10-CM | POA: Diagnosis not present

## 2023-06-06 DIAGNOSIS — M810 Age-related osteoporosis without current pathological fracture: Secondary | ICD-10-CM | POA: Diagnosis not present

## 2023-06-06 DIAGNOSIS — M170 Bilateral primary osteoarthritis of knee: Secondary | ICD-10-CM | POA: Diagnosis not present

## 2023-06-06 DIAGNOSIS — I130 Hypertensive heart and chronic kidney disease with heart failure and stage 1 through stage 4 chronic kidney disease, or unspecified chronic kidney disease: Secondary | ICD-10-CM | POA: Diagnosis not present

## 2023-06-06 DIAGNOSIS — Z48815 Encounter for surgical aftercare following surgery on the digestive system: Secondary | ICD-10-CM | POA: Diagnosis not present

## 2023-06-07 DIAGNOSIS — M170 Bilateral primary osteoarthritis of knee: Secondary | ICD-10-CM | POA: Diagnosis not present

## 2023-06-07 DIAGNOSIS — E1122 Type 2 diabetes mellitus with diabetic chronic kidney disease: Secondary | ICD-10-CM | POA: Diagnosis not present

## 2023-06-07 DIAGNOSIS — I130 Hypertensive heart and chronic kidney disease with heart failure and stage 1 through stage 4 chronic kidney disease, or unspecified chronic kidney disease: Secondary | ICD-10-CM | POA: Diagnosis not present

## 2023-06-07 DIAGNOSIS — I509 Heart failure, unspecified: Secondary | ICD-10-CM | POA: Diagnosis not present

## 2023-06-07 DIAGNOSIS — Z48815 Encounter for surgical aftercare following surgery on the digestive system: Secondary | ICD-10-CM | POA: Diagnosis not present

## 2023-06-07 DIAGNOSIS — N1832 Chronic kidney disease, stage 3b: Secondary | ICD-10-CM | POA: Diagnosis not present

## 2023-06-10 ENCOUNTER — Encounter: Payer: Self-pay | Admitting: Orthopedic Surgery

## 2023-06-10 ENCOUNTER — Ambulatory Visit (INDEPENDENT_AMBULATORY_CARE_PROVIDER_SITE_OTHER): Admitting: Orthopedic Surgery

## 2023-06-10 DIAGNOSIS — I739 Peripheral vascular disease, unspecified: Secondary | ICD-10-CM

## 2023-06-10 DIAGNOSIS — I87332 Chronic venous hypertension (idiopathic) with ulcer and inflammation of left lower extremity: Secondary | ICD-10-CM | POA: Diagnosis not present

## 2023-06-10 DIAGNOSIS — S81802D Unspecified open wound, left lower leg, subsequent encounter: Secondary | ICD-10-CM | POA: Diagnosis not present

## 2023-06-10 NOTE — Progress Notes (Signed)
 Office Visit Note   Patient: Shelby Walker           Date of Birth: 1934/01/10           MRN: [id number] Visit Date: 06/10/2023              Requested by: Adrian Prince, MD [address] [place],  [place] [address] PCP: Adrian Prince, MD  Chief Complaint  Patient presents with   Right Foot - Follow-up   Left Foot - Follow-up      HPI: Patient is an 89 year old woman who presents in follow-up for bilateral lower extremity ischemic ulcers.  She is currently using Iodosorb dressing changes.  She is status post a right transmetatarsal amputation.  Assessment & Plan: Visit Diagnoses:  1. Chronic venous hypertension (idiopathic) with ulcer and inflammation of left lower extremity (HCC)   2. PAD (peripheral artery disease) (HCC)   3. Wound of left lower extremity, subsequent encounter     Plan: Recommended that she use the nitroglycerin patch in place half over the dorsalis pedis artery on the right and half over the posterior tibial artery on the left.  Follow-Up Instructions: Return in about 4 weeks (around 07/08/2023).   Ortho Exam  Patient is alert, oriented, no adenopathy, well-dressed, normal affect, normal respiratory effort. Examination the ulcer on the great toe left foot is healing.  She also has a ulcer over the medial left heel that is 3 mm in diameter and flat.  There is an ulcer over the fifth metatarsal laterally on the right transmetatarsal amputation which is 3 mm in diameter.  These ulcers are tender to palpation have no depth.  Symptomatically ischemic.  Imaging: No results found. No images are attached to the encounter.  Labs: Lab Results  Component Value Date   HGBA1C 9.2 (H) 04/04/2023   HGBA1C 9.2 (H) 08/08/2022   HGBA1C 7.3 (H) 01/24/2022   ESRSEDRATE 31 (H) 05/01/2022   ESRSEDRATE 22 03/27/2022   ESRSEDRATE 18 02/09/2022   CRP 8.0 (H) 08/12/2022   CRP 8.1 (H) 08/11/2022   CRP 8.5 (H) 08/10/2022   REPTSTATUS 08/10/2022 FINAL 08/07/2022   CULT (A)  08/07/2022    ENTEROBACTER CLOACAE SUSCEPTIBILITIES PERFORMED ON PREVIOUS CULTURE WITHIN THE LAST 5 DAYS. Performed at [facility], [address] [place], [place] [address]    LABORGA ENTEROBACTER CLOACAE 08/07/2022     Lab Results  Component Value Date   ALBUMIN 2.8 (L) 04/05/2023   ALBUMIN 3.4 (L) 04/04/2023   ALBUMIN 3.3 (L) 04/03/2023   PREALBUMIN 18 02/05/2022   PREALBUMIN 15.2 (L) 01/16/2021    Lab Results  Component Value Date   MG 2.4 04/05/2023   MG 2.3 08/12/2022   MG 2.3 08/11/2022   No results found for: "VD25OH"  Lab Results  Component Value Date   PREALBUMIN 18 02/05/2022   PREALBUMIN 15.2 (L) 01/16/2021      Latest Ref Rng & Units 04/05/2023    5:20 AM 04/04/2023    4:14 AM 04/03/2023    6:23 PM  CBC EXTENDED  WBC 4.0 - 10.5 K/uL 10.8  9.9  11.4   RBC 3.87 - 5.11 MIL/uL 3.31  3.85  3.70   Hemoglobin 12.0 - 15.0 g/dL 64.4  03.4  74.2   HCT 36.0 - 46.0 % 32.1  37.8  36.1   Platelets 150 - 400 K/uL 177  209  227   NEUT# 1.7 - 7.7 K/uL  6.8  9.5   Lymph# 0.7 -  4.0 K/uL  1.7  1.2      There is no height or weight on file to calculate BMI.  Orders:  No orders of the defined types were placed in this encounter.  No orders of the defined types were placed in this encounter.    Procedures: No procedures performed  Clinical Data: No additional findings.  ROS:  All other systems negative, except as noted in the HPI. Review of Systems  Objective: Vital Signs: There were no vitals taken for this visit.  Specialty Comments:  EXAM: MRI LUMBAR SPINE WITHOUT CONTRAST   TECHNIQUE: Multiplanar, multisequence MR imaging of the lumbar spine was performed. No intravenous contrast was administered.   COMPARISON:  RADIOGRAPHY OF THE LUMBAR DATED 11/16/2020. RADIOGRAPHY OF THE LUMBAR DATED 08/07/2018.   FINDINGS: Segmentation:  Standard.   Alignment: Grade 1 anterolisthesis of L3 on L4, L4 on L5 and L5-S1 secondary to facet disease.    Vertebrae: No acute fracture, evidence of discitis, or aggressive bone lesion.   Conus medullaris and cauda equina: Conus extends to the L1-2 level. Conus and cauda equina appear normal.   Paraspinal and other soft tissues: No acute paraspinal abnormality. Right renal cyst.   Disc levels:   Disc spaces: Degenerative disease with disc height loss at L3-4, L4-5 and L5-S1.   T12-L1: Minimal broad-based disc bulge. No foraminal or central canal stenosis.   L1-L2: No significant disc bulge. No neural foraminal stenosis. No central canal stenosis.   L2-L3: Minimal broad-based disc bulge. Mild bilateral facet arthropathy. No foraminal or central canal stenosis.   L3-L4: Broad-based disc bulge with a broad left paracentral disc protrusion with mass effect on the left intraspinal L4 nerve root. Moderate bilateral facet arthropathy. Moderate-severe spinal stenosis. Left subarticular recess stenosis. Moderate left foraminal stenosis. Mild right foraminal stenosis.   L4-L5: Broad-based disc bulge with a broad central disc protrusion. Severe bilateral facet arthropathy with ligamentum flavum infolding. Severe spinal stenosis. Mild right and severe left foraminal stenosis.   L5-S1: Mild broad-based disc bulge with a broad shallow right paracentral disc protrusion with mass effect on the right intraspinal S1 nerve root. Mild bilateral foraminal stenosis. Mild bilateral facet arthropathy. No spinal stenosis.   IMPRESSION: 1. Lumbar spine spondylosis as described above most severe at L3-4, L4-5 and L5-S1. 2. No acute osseous injury of the lumbar spine.     Electronically Signed   By: Elige Ko M.D.   On: 11/23/2020 08:11  PMFS History: Patient Active Problem List   Diagnosis Date Noted   Elevated LFTs 04/04/2023   Anemia 04/04/2023   Choledocholithiasis 04/03/2023   Acute biliary pancreatitis 08/07/2022   Sepsis (HCC) 08/07/2022   Sacral wound 05/14/2022   Coronary  artery disease involving native coronary artery of native heart without angina pectoris 02/21/2022   Discitis of lumbar region 02/05/2022   Herpes zoster without complication 02/05/2022   Clostridium difficile colitis 02/05/2022   Severe sepsis with acute organ dysfunction due to Gram positive bacteria (HCC) 02/02/2022   Pacemaker infection (HCC) 02/01/2022   Periostitis of lumbar spine without osteomyelitis (HCC) 02/01/2022   Acute midline low back pain without sciatica 01/26/2022   MSSA bacteremia 01/25/2022   Presence of cardiac pacemaker 01/25/2022   Severe sepsis with septic shock (HCC) 01/24/2022   AKI (acute kidney injury) (HCC) 01/24/2022   Sinus pause 01/04/2022   Acute viral bronchitis 02/06/2021   Acute blood loss anemia    Postoperative pain    S/P transmetatarsal amputation of foot, right (  HCC) 01/24/2021   Protein-calorie malnutrition, severe 01/17/2021   Diabetic foot infection (HCC) 01/16/2021   Rheumatoid arthritis (HCC) 01/16/2021   Stage 3b chronic kidney disease (CKD) (HCC) 01/16/2021   DNR (do not resuscitate) 01/16/2021   Decreased anal sphincter tone 12/13/2018   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   Irritable bowel syndrome with diarrhea - post infectious 12/12/2018   Full incontinence of feces and fecal smearing 12/12/2018   Perianal dermatitis 12/12/2018   Uncontrolled type 2 diabetes mellitus with hyperglycemia (HCC) 10/26/2018   Long-term use of immunosuppressant medication-Enbrel 10/26/2018   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 08/27/2018   Thrombocytopenia (HCC)    Essential hypertension    Dyspnea on exertion    Steroid-induced hyperglycemia    Fatigue 08/06/2018   Paroxysmal atrial fibrillation (HCC)    Mixed hyperlipidemia 09/29/2016   Stable angina (HCC) 09/29/2016   PAD (peripheral artery disease) (HCC) 08/09/2016   Preoperative clearance 05/21/2012   Precordial chest pain    Palpitations    Diabetes mellitus (HCC)    GERD  (gastroesophageal reflux disease)    Ejection fraction    Carotid artery disease (HCC)    Mitral regurgitation 04/21/2009   PSORIASIS 02/09/2009   Rheumatoid arthritis flare (HCC) 02/09/2009   Past Medical History:  Diagnosis Date   Anemia    Bruit    Carotid Doppler showed no significant abnormality     9per patient)   C. difficile colitis 07/2018   with severe sepsis   Chest pain, unspecified    Nuclear, May, 2008, no scar or ischemia   Chronic kidney disease    Diabetes mellitus    Diverticulosis    Dyslipidemia    Dysrhythmia    a-fib   Ejection fraction    EF 55-60%, echo, February, 2011 // Echocardiogram 8/21: EF 60-65, no RWMA, Gr 1 DD, GLS -14%, normal RVSF, mild LAE, trivial MR, mild MS (mean 4 mmHg), RVSP 23.4    GERD (gastroesophageal reflux disease)    History of kidney stones    removed   HLD (hyperlipidemia)    Implantable loop recorder present    Memory loss    Mitral regurgitation 04/21/2009   mild,  echo, February, 2011   Osteoporosis    Palpitations    possible very brief atrial fibrillation on monitor and possible reentrant tachycardia   Pneumonia    PONV (postoperative nausea and vomiting)    Psoriasis    Rheumatoid arthritis (HCC)     Family History  Problem Relation Age of Onset   Heart failure Father    Heart attack Father    Diabetes Father    Heart attack Mother    Heart failure Mother    Diabetes Mother    Stroke Sister     Past Surgical History:  Procedure Laterality Date   ABDOMINAL AORTOGRAM W/LOWER EXTREMITY Right 10/19/2020   Procedure: ABDOMINAL AORTOGRAM W/LOWER EXTREMITY;  Surgeon: Iran Ouch, MD;  Location: [facility];  Service: Cardiovascular;  Laterality: Right;   CHOLECYSTECTOMY N/A 08/09/2022   Procedure: LAPAROSCOPIC CHOLECYSTECTOMY WITH INTRAOPERATIVE CHOLANGIOGRAM;  Surgeon: Quentin Ore, MD;  Location: [facility];  Service: General;  Laterality: N/A;   ENDOSCOPIC RETROGRADE CHOLANGIOPANCREATOGRAPHY  (ERCP) WITH PROPOFOL N/A 04/05/2023   Procedure: ENDOSCOPIC RETROGRADE CHOLANGIOPANCREATOGRAPHY (ERCP) WITH PROPOFOL;  Surgeon: Hilarie Fredrickson, MD;  Location: [facility];  Service: Gastroenterology;  Laterality: N/A;   FRACTURE SURGERY     KYPHOPLASTY N/A 04/25/2023   Procedure: LUMBAR 1 KYPHOPLASTY;  Surgeon: Estill Bamberg,  MD;  Location: [facility];  Service: Orthopedics;  Laterality: N/A;   LOOP Recorder placement     LOOP RECORDER REMOVAL N/A 01/05/2022   Procedure: LOOP RECORDER REMOVAL;  Surgeon: Marinus Maw, MD;  Location: [facility];  Service: Cardiovascular;  Laterality: N/A;   PACEMAKER IMPLANT N/A 01/05/2022   Procedure: [employer] PACEMAKER IMPLANT;  Surgeon: Marinus Maw, MD;  Location: [facility];  Service: Cardiovascular;  Laterality: N/A;   PPM GENERATOR REMOVAL N/A 01/29/2022   Procedure: PPM GENERATOR REMOVAL;  Surgeon: Marinus Maw, MD;  Location: [facility];  Service: Cardiovascular;  Laterality: N/A;   REMOVAL OF STONES  04/05/2023   Procedure: REMOVAL OF STONES;  Surgeon: Hilarie Fredrickson, MD;  Location: [facility];  Service: Gastroenterology;;   Dennison Mascot  04/05/2023   Procedure: Dennison Mascot;  Surgeon: Hilarie Fredrickson, MD;  Location: [facility];  Service: Gastroenterology;;   TEE WITHOUT CARDIOVERSION N/A 01/29/2022   Procedure: TRANSESOPHAGEAL ECHOCARDIOGRAM (TEE);  Surgeon: Sande Rives, MD;  Location: [facility] ENDOSCOPY;  Service: Cardiovascular;  Laterality: N/A;   TRANSMETATARSAL AMPUTATION Right 01/18/2021   Procedure: TRANSMETATARSAL AMPUTATION;  Surgeon: Toni Arthurs, MD;  Location: [facility] OR;  Service: Orthopedics;  Laterality: Right;   Social History   Occupational History    Employer: RETIRED  Tobacco Use   Smoking status: Never   Smokeless tobacco: Never  Vaping Use   Vaping status: Never Used  Substance and Sexual Activity   Alcohol use: No    Alcohol/week: 0.0 standard drinks of alcohol   Drug use: No   Sexual  activity: Yes    Birth control/protection: Post-menopausal

## 2023-06-11 ENCOUNTER — Encounter: Payer: Self-pay | Admitting: Family

## 2023-06-11 DIAGNOSIS — I2510 Atherosclerotic heart disease of native coronary artery without angina pectoris: Secondary | ICD-10-CM | POA: Diagnosis not present

## 2023-06-11 DIAGNOSIS — E1142 Type 2 diabetes mellitus with diabetic polyneuropathy: Secondary | ICD-10-CM | POA: Diagnosis not present

## 2023-06-11 DIAGNOSIS — H9193 Unspecified hearing loss, bilateral: Secondary | ICD-10-CM | POA: Diagnosis not present

## 2023-06-11 DIAGNOSIS — I890 Lymphedema, not elsewhere classified: Secondary | ICD-10-CM | POA: Diagnosis not present

## 2023-06-11 DIAGNOSIS — K5730 Diverticulosis of large intestine without perforation or abscess without bleeding: Secondary | ICD-10-CM | POA: Diagnosis not present

## 2023-06-11 DIAGNOSIS — L89892 Pressure ulcer of other site, stage 2: Secondary | ICD-10-CM | POA: Diagnosis not present

## 2023-06-11 DIAGNOSIS — D631 Anemia in chronic kidney disease: Secondary | ICD-10-CM | POA: Diagnosis not present

## 2023-06-11 DIAGNOSIS — K219 Gastro-esophageal reflux disease without esophagitis: Secondary | ICD-10-CM | POA: Diagnosis not present

## 2023-06-11 DIAGNOSIS — I700 Atherosclerosis of aorta: Secondary | ICD-10-CM | POA: Diagnosis not present

## 2023-06-11 DIAGNOSIS — M1990 Unspecified osteoarthritis, unspecified site: Secondary | ICD-10-CM | POA: Diagnosis not present

## 2023-06-11 DIAGNOSIS — N281 Cyst of kidney, acquired: Secondary | ICD-10-CM | POA: Diagnosis not present

## 2023-06-11 DIAGNOSIS — E1151 Type 2 diabetes mellitus with diabetic peripheral angiopathy without gangrene: Secondary | ICD-10-CM | POA: Diagnosis not present

## 2023-06-11 DIAGNOSIS — I052 Rheumatic mitral stenosis with insufficiency: Secondary | ICD-10-CM | POA: Diagnosis not present

## 2023-06-11 DIAGNOSIS — I480 Paroxysmal atrial fibrillation: Secondary | ICD-10-CM | POA: Diagnosis not present

## 2023-06-11 DIAGNOSIS — M170 Bilateral primary osteoarthritis of knee: Secondary | ICD-10-CM | POA: Diagnosis not present

## 2023-06-11 DIAGNOSIS — I872 Venous insufficiency (chronic) (peripheral): Secondary | ICD-10-CM | POA: Diagnosis not present

## 2023-06-11 DIAGNOSIS — M810 Age-related osteoporosis without current pathological fracture: Secondary | ICD-10-CM | POA: Diagnosis not present

## 2023-06-11 DIAGNOSIS — N1832 Chronic kidney disease, stage 3b: Secondary | ICD-10-CM | POA: Diagnosis not present

## 2023-06-11 DIAGNOSIS — E785 Hyperlipidemia, unspecified: Secondary | ICD-10-CM | POA: Diagnosis not present

## 2023-06-11 DIAGNOSIS — M069 Rheumatoid arthritis, unspecified: Secondary | ICD-10-CM | POA: Diagnosis not present

## 2023-06-11 DIAGNOSIS — I130 Hypertensive heart and chronic kidney disease with heart failure and stage 1 through stage 4 chronic kidney disease, or unspecified chronic kidney disease: Secondary | ICD-10-CM | POA: Diagnosis not present

## 2023-06-11 DIAGNOSIS — E875 Hyperkalemia: Secondary | ICD-10-CM | POA: Diagnosis not present

## 2023-06-11 DIAGNOSIS — L89622 Pressure ulcer of left heel, stage 2: Secondary | ICD-10-CM | POA: Diagnosis not present

## 2023-06-11 DIAGNOSIS — E1122 Type 2 diabetes mellitus with diabetic chronic kidney disease: Secondary | ICD-10-CM | POA: Diagnosis not present

## 2023-06-11 DIAGNOSIS — I509 Heart failure, unspecified: Secondary | ICD-10-CM | POA: Diagnosis not present

## 2023-06-11 NOTE — Progress Notes (Signed)
 Office Visit Note   Patient: Shelby Walker           Date of Birth: 15-Nov-1933           MRN: [id number] Visit Date: 06/05/2023              Requested by: Adrian Prince, MD [address] [place],  [place] [address] PCP: Adrian Prince, MD  Chief Complaint  Patient presents with   Left Foot - Wound Check      HPI: the patient is an 89 year old woman who is seen for bilateral foot evaluation.  She has had new painful areas to her feet.  Last week there was concern for infection.  At that time the painful area had opened up drained and did not appear to any longer be infected.  Will was provided with felt pressure relieving donut pad for her shoewear.  Has been having a hard time keeping this in place in her shoewear and has subsequently developed another area with pain and tenderness over the bony prominence.  There are no chills.  Assessment & Plan: Visit Diagnoses: No diagnosis found.  Plan: Discussed using corn pads and moleskin to offload ulcer to the areas.  She will complete her course of Keflex.  Discussed return precautions.  As well as wound care  Follow-Up Instructions: No follow-ups on file.   Ortho Exam  Patient is alert, oriented, no adenopathy, well-dressed, normal affect, normal respiratory effort. Examination of the right foot the transmetatarsal amputation is well-healed unfortunately over the lateral border of the forefoot she does have a pressure ulcer.  This today is dry with mild erythema there is exquisite tenderness.  We have attempted offloading this bony prominence with padding in the past. On examination left foot she also has ulcer to the area which is about 8 mm in diameter over the medial heel.  Cannot appreciate how this is rubbing within her shoewear.  This has mild erythema and exquisite tenderness there is no drainage or open area  Imaging: No results found. No images are attached to the encounter.  Labs: Lab Results  Component Value Date    HGBA1C 9.2 (H) 04/04/2023   HGBA1C 9.2 (H) 08/08/2022   HGBA1C 7.3 (H) 01/24/2022   ESRSEDRATE 31 (H) 05/01/2022   ESRSEDRATE 22 03/27/2022   ESRSEDRATE 18 02/09/2022   CRP 8.0 (H) 08/12/2022   CRP 8.1 (H) 08/11/2022   CRP 8.5 (H) 08/10/2022   REPTSTATUS 08/10/2022 FINAL 08/07/2022   CULT (A) 08/07/2022    ENTEROBACTER CLOACAE SUSCEPTIBILITIES PERFORMED ON PREVIOUS CULTURE WITHIN THE LAST 5 DAYS. Performed at [facility], [address] [place], [place] [address]    LABORGA ENTEROBACTER CLOACAE 08/07/2022     Lab Results  Component Value Date   ALBUMIN 2.8 (L) 04/05/2023   ALBUMIN 3.4 (L) 04/04/2023   ALBUMIN 3.3 (L) 04/03/2023   PREALBUMIN 18 02/05/2022   PREALBUMIN 15.2 (L) 01/16/2021    Lab Results  Component Value Date   MG 2.4 04/05/2023   MG 2.3 08/12/2022   MG 2.3 08/11/2022   No results found for: "VD25OH"  Lab Results  Component Value Date   PREALBUMIN 18 02/05/2022   PREALBUMIN 15.2 (L) 01/16/2021      Latest Ref Rng & Units 04/05/2023    5:20 AM 04/04/2023    4:14 AM 04/03/2023    6:23 PM  CBC EXTENDED  WBC 4.0 - 10.5 K/uL 10.8  9.9  11.4   RBC 3.87 -  5.11 MIL/uL 3.31  3.85  3.70   Hemoglobin 12.0 - 15.0 g/dL 40.9  81.1  91.4   HCT 36.0 - 46.0 % 32.1  37.8  36.1   Platelets 150 - 400 K/uL 177  209  227   NEUT# 1.7 - 7.7 K/uL  6.8  9.5   Lymph# 0.7 - 4.0 K/uL  1.7  1.2      There is no height or weight on file to calculate BMI.  Orders:  No orders of the defined types were placed in this encounter.  No orders of the defined types were placed in this encounter.    Procedures: No procedures performed  Clinical Data: No additional findings.  ROS:  All other systems negative, except as noted in the HPI. Review of Systems  Objective: Vital Signs: There were no vitals taken for this visit.  Specialty Comments:  EXAM: MRI LUMBAR SPINE WITHOUT CONTRAST   TECHNIQUE: Multiplanar, multisequence MR imaging of the lumbar spine  was performed. No intravenous contrast was administered.   COMPARISON:  RADIOGRAPHY OF THE LUMBAR DATED 11/16/2020. RADIOGRAPHY OF THE LUMBAR DATED 08/07/2018.   FINDINGS: Segmentation:  Standard.   Alignment: Grade 1 anterolisthesis of L3 on L4, L4 on L5 and L5-S1 secondary to facet disease.   Vertebrae: No acute fracture, evidence of discitis, or aggressive bone lesion.   Conus medullaris and cauda equina: Conus extends to the L1-2 level. Conus and cauda equina appear normal.   Paraspinal and other soft tissues: No acute paraspinal abnormality. Right renal cyst.   Disc levels:   Disc spaces: Degenerative disease with disc height loss at L3-4, L4-5 and L5-S1.   T12-L1: Minimal broad-based disc bulge. No foraminal or central canal stenosis.   L1-L2: No significant disc bulge. No neural foraminal stenosis. No central canal stenosis.   L2-L3: Minimal broad-based disc bulge. Mild bilateral facet arthropathy. No foraminal or central canal stenosis.   L3-L4: Broad-based disc bulge with a broad left paracentral disc protrusion with mass effect on the left intraspinal L4 nerve root. Moderate bilateral facet arthropathy. Moderate-severe spinal stenosis. Left subarticular recess stenosis. Moderate left foraminal stenosis. Mild right foraminal stenosis.   L4-L5: Broad-based disc bulge with a broad central disc protrusion. Severe bilateral facet arthropathy with ligamentum flavum infolding. Severe spinal stenosis. Mild right and severe left foraminal stenosis.   L5-S1: Mild broad-based disc bulge with a broad shallow right paracentral disc protrusion with mass effect on the right intraspinal S1 nerve root. Mild bilateral foraminal stenosis. Mild bilateral facet arthropathy. No spinal stenosis.   IMPRESSION: 1. Lumbar spine spondylosis as described above most severe at L3-4, L4-5 and L5-S1. 2. No acute osseous injury of the lumbar spine.     Electronically Signed   By:  Elige Ko M.D.   On: 11/23/2020 08:11  PMFS History: Patient Active Problem List   Diagnosis Date Noted   Elevated LFTs 04/04/2023   Anemia 04/04/2023   Choledocholithiasis 04/03/2023   Acute biliary pancreatitis 08/07/2022   Sepsis (HCC) 08/07/2022   Sacral wound 05/14/2022   Coronary artery disease involving native coronary artery of native heart without angina pectoris 02/21/2022   Discitis of lumbar region 02/05/2022   Herpes zoster without complication 02/05/2022   Clostridium difficile colitis 02/05/2022   Severe sepsis with acute organ dysfunction due to Gram positive bacteria (HCC) 02/02/2022   Pacemaker infection (HCC) 02/01/2022   Periostitis of lumbar spine without osteomyelitis (HCC) 02/01/2022   Acute midline low back pain without sciatica 01/26/2022  MSSA bacteremia 01/25/2022   Presence of cardiac pacemaker 01/25/2022   Severe sepsis with septic shock (HCC) 01/24/2022   AKI (acute kidney injury) (HCC) 01/24/2022   Sinus pause 01/04/2022   Acute viral bronchitis 02/06/2021   Acute blood loss anemia    Postoperative pain    S/P transmetatarsal amputation of foot, right (HCC) 01/24/2021   Protein-calorie malnutrition, severe 01/17/2021   Diabetic foot infection (HCC) 01/16/2021   Rheumatoid arthritis (HCC) 01/16/2021   Stage 3b chronic kidney disease (CKD) (HCC) 01/16/2021   DNR (do not resuscitate) 01/16/2021   Decreased anal sphincter tone 12/13/2018   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   Irritable bowel syndrome with diarrhea - post infectious 12/12/2018   Full incontinence of feces and fecal smearing 12/12/2018   Perianal dermatitis 12/12/2018   Uncontrolled type 2 diabetes mellitus with hyperglycemia (HCC) 10/26/2018   Long-term use of immunosuppressant medication-Enbrel 10/26/2018   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 08/27/2018   Thrombocytopenia (HCC)    Essential hypertension    Dyspnea on exertion    Steroid-induced hyperglycemia     Fatigue 08/06/2018   Paroxysmal atrial fibrillation (HCC)    Mixed hyperlipidemia 09/29/2016   Stable angina (HCC) 09/29/2016   PAD (peripheral artery disease) (HCC) 08/09/2016   Preoperative clearance 05/21/2012   Precordial chest pain    Palpitations    Diabetes mellitus (HCC)    GERD (gastroesophageal reflux disease)    Ejection fraction    Carotid artery disease (HCC)    Mitral regurgitation 04/21/2009   PSORIASIS 02/09/2009   Rheumatoid arthritis flare (HCC) 02/09/2009   Past Medical History:  Diagnosis Date   Anemia    Bruit    Carotid Doppler showed no significant abnormality     9per patient)   C. difficile colitis 07/2018   with severe sepsis   Chest pain, unspecified    Nuclear, May, 2008, no scar or ischemia   Chronic kidney disease    Diabetes mellitus    Diverticulosis    Dyslipidemia    Dysrhythmia    a-fib   Ejection fraction    EF 55-60%, echo, February, 2011 // Echocardiogram 8/21: EF 60-65, no RWMA, Gr 1 DD, GLS -14%, normal RVSF, mild LAE, trivial MR, mild MS (mean 4 mmHg), RVSP 23.4    GERD (gastroesophageal reflux disease)    History of kidney stones    removed   HLD (hyperlipidemia)    Implantable loop recorder present    Memory loss    Mitral regurgitation 04/21/2009   mild,  echo, February, 2011   Osteoporosis    Palpitations    possible very brief atrial fibrillation on monitor and possible reentrant tachycardia   Pneumonia    PONV (postoperative nausea and vomiting)    Psoriasis    Rheumatoid arthritis (HCC)     Family History  Problem Relation Age of Onset   Heart failure Father    Heart attack Father    Diabetes Father    Heart attack Mother    Heart failure Mother    Diabetes Mother    Stroke Sister     Past Surgical History:  Procedure Laterality Date   ABDOMINAL AORTOGRAM W/LOWER EXTREMITY Right 10/19/2020   Procedure: ABDOMINAL AORTOGRAM W/LOWER EXTREMITY;  Surgeon: Iran Ouch, MD;  [facility]: [facility];   Service: Cardiovascular;  Laterality: Right;   CHOLECYSTECTOMY N/A 08/09/2022   Procedure: LAPAROSCOPIC CHOLECYSTECTOMY WITH INTRAOPERATIVE CHOLANGIOGRAM;  Surgeon: Quentin Ore, MD;  Location: [facility];  Service: General;  Laterality: N/A;   ENDOSCOPIC RETROGRADE CHOLANGIOPANCREATOGRAPHY (ERCP) WITH PROPOFOL N/A 04/05/2023   Procedure: ENDOSCOPIC RETROGRADE CHOLANGIOPANCREATOGRAPHY (ERCP) WITH PROPOFOL;  Surgeon: Hilarie Fredrickson, MD;  Location: [facility];  Service: Gastroenterology;  Laterality: N/A;   FRACTURE SURGERY     KYPHOPLASTY N/A 04/25/2023   Procedure: LUMBAR 1 KYPHOPLASTY;  Surgeon: Estill Bamberg, MD;  Location: [facility];  Service: Orthopedics;  Laterality: N/A;   LOOP Recorder placement     LOOP RECORDER REMOVAL N/A 01/05/2022   Procedure: LOOP RECORDER REMOVAL;  Surgeon: Marinus Maw, MD;  [facility];  Service: Cardiovascular;  Laterality: N/A;   PACEMAKER IMPLANT N/A 01/05/2022   Procedure: [employer] PACEMAKER IMPLANT;  Surgeon: Marinus Maw, MD;  [facility];  Service: Cardiovascular;  Laterality: N/A;   PPM GENERATOR REMOVAL N/A 01/29/2022   Procedure: PPM GENERATOR REMOVAL;  Surgeon: Marinus Maw, MD;  [facility];  Service: Cardiovascular;  Laterality: N/A;   REMOVAL OF STONES  04/05/2023   Procedure: REMOVAL OF STONES;  Surgeon: Hilarie Fredrickson, MD;  Location: [facility];  Service: Gastroenterology;;   Dennison Mascot  04/05/2023   Procedure: Dennison Mascot;  Surgeon: Hilarie Fredrickson, MD;  Location: [facility];  Service: Gastroenterology;;   TEE WITHOUT CARDIOVERSION N/A 01/29/2022   Procedure: TRANSESOPHAGEAL ECHOCARDIOGRAM (TEE);  Surgeon: Sande Rives, MD;  Location: [facility];  Service: Cardiovascular;  Laterality: N/A;   TRANSMETATARSAL AMPUTATION Right 01/18/2021   Procedure: TRANSMETATARSAL AMPUTATION;  Surgeon: Toni Arthurs, MD;  Location: [facility];  Service: Orthopedics;  Laterality: Right;    Social History   Occupational History    Employer: RETIRED  Tobacco Use   Smoking status: Never   Smokeless tobacco: Never  Vaping Use   Vaping status: Never Used  Substance and Sexual Activity   Alcohol use: No    Alcohol/week: 0.0 standard drinks of alcohol   Drug use: No   Sexual activity: Yes    Birth control/protection: Post-menopausal

## 2023-06-12 DIAGNOSIS — E1122 Type 2 diabetes mellitus with diabetic chronic kidney disease: Secondary | ICD-10-CM | POA: Diagnosis not present

## 2023-06-12 DIAGNOSIS — M170 Bilateral primary osteoarthritis of knee: Secondary | ICD-10-CM | POA: Diagnosis not present

## 2023-06-12 DIAGNOSIS — I509 Heart failure, unspecified: Secondary | ICD-10-CM | POA: Diagnosis not present

## 2023-06-12 DIAGNOSIS — L89622 Pressure ulcer of left heel, stage 2: Secondary | ICD-10-CM | POA: Diagnosis not present

## 2023-06-12 DIAGNOSIS — L89892 Pressure ulcer of other site, stage 2: Secondary | ICD-10-CM | POA: Diagnosis not present

## 2023-06-12 DIAGNOSIS — I130 Hypertensive heart and chronic kidney disease with heart failure and stage 1 through stage 4 chronic kidney disease, or unspecified chronic kidney disease: Secondary | ICD-10-CM | POA: Diagnosis not present

## 2023-06-15 ENCOUNTER — Other Ambulatory Visit: Payer: Self-pay | Admitting: Cardiovascular Disease

## 2023-06-17 ENCOUNTER — Ambulatory Visit (INDEPENDENT_AMBULATORY_CARE_PROVIDER_SITE_OTHER): Payer: Medicare Other

## 2023-06-17 ENCOUNTER — Encounter: Payer: Self-pay | Admitting: Internal Medicine

## 2023-06-17 DIAGNOSIS — M170 Bilateral primary osteoarthritis of knee: Secondary | ICD-10-CM | POA: Diagnosis not present

## 2023-06-17 DIAGNOSIS — L89892 Pressure ulcer of other site, stage 2: Secondary | ICD-10-CM | POA: Diagnosis not present

## 2023-06-17 DIAGNOSIS — I4819 Other persistent atrial fibrillation: Secondary | ICD-10-CM

## 2023-06-17 DIAGNOSIS — I130 Hypertensive heart and chronic kidney disease with heart failure and stage 1 through stage 4 chronic kidney disease, or unspecified chronic kidney disease: Secondary | ICD-10-CM | POA: Diagnosis not present

## 2023-06-17 DIAGNOSIS — I509 Heart failure, unspecified: Secondary | ICD-10-CM | POA: Diagnosis not present

## 2023-06-17 DIAGNOSIS — L89622 Pressure ulcer of left heel, stage 2: Secondary | ICD-10-CM | POA: Diagnosis not present

## 2023-06-17 DIAGNOSIS — E1122 Type 2 diabetes mellitus with diabetic chronic kidney disease: Secondary | ICD-10-CM | POA: Diagnosis not present

## 2023-06-17 LAB — CUP PACEART REMOTE DEVICE CHECK
Date Time Interrogation Session: 20250413231945
Implantable Pulse Generator Implant Date: 20240708

## 2023-06-17 NOTE — Progress Notes (Signed)
 [facility] Summary Report / Loop Recorder

## 2023-06-18 ENCOUNTER — Telehealth: Payer: Self-pay | Admitting: *Deleted

## 2023-06-18 ENCOUNTER — Other Ambulatory Visit (HOSPITAL_COMMUNITY): Payer: Self-pay

## 2023-06-18 MED ORDER — AMIODARONE HCL 200 MG PO TABS
100.0000 mg | ORAL_TABLET | Freq: Every day | ORAL | 2 refills | Status: DC
  Filled 2023-06-18: qty 45, 90d supply, fill #0

## 2023-06-18 MED ORDER — AMIODARONE HCL 200 MG PO TABS: 100.0000 mg | ORAL_TABLET | Freq: Every day | ORAL | 2 refills | Status: DC

## 2023-06-18 NOTE — Telephone Encounter (Signed)
 [facility],   [address] [place], [place] refill request for  Amiodarone 200 mg patient taking one daily. Note in epic states patient taking 0.5 mg (100 mg). Please verify, Thank you.

## 2023-06-18 NOTE — Telephone Encounter (Signed)
 Pt's medication was corrected and resent to pt's pharmacy confirmation received.

## 2023-06-18 NOTE — Telephone Encounter (Signed)
 Enter in error

## 2023-06-18 NOTE — Addendum Note (Signed)
 Addended by: Sampson Critchley R on: 06/18/2023 09:25 AM   Modules accepted: Orders

## 2023-06-18 NOTE — Telephone Encounter (Signed)
 Per Dr Doyle Generous last OV note 1/25 pt is taking Amiodarone 100mg  daily.

## 2023-06-19 DIAGNOSIS — I509 Heart failure, unspecified: Secondary | ICD-10-CM | POA: Diagnosis not present

## 2023-06-19 DIAGNOSIS — L89892 Pressure ulcer of other site, stage 2: Secondary | ICD-10-CM | POA: Diagnosis not present

## 2023-06-19 DIAGNOSIS — L89622 Pressure ulcer of left heel, stage 2: Secondary | ICD-10-CM | POA: Diagnosis not present

## 2023-06-19 DIAGNOSIS — E1122 Type 2 diabetes mellitus with diabetic chronic kidney disease: Secondary | ICD-10-CM | POA: Diagnosis not present

## 2023-06-19 DIAGNOSIS — M170 Bilateral primary osteoarthritis of knee: Secondary | ICD-10-CM | POA: Diagnosis not present

## 2023-06-19 DIAGNOSIS — I130 Hypertensive heart and chronic kidney disease with heart failure and stage 1 through stage 4 chronic kidney disease, or unspecified chronic kidney disease: Secondary | ICD-10-CM | POA: Diagnosis not present

## 2023-06-25 DIAGNOSIS — M170 Bilateral primary osteoarthritis of knee: Secondary | ICD-10-CM | POA: Diagnosis not present

## 2023-06-25 DIAGNOSIS — I130 Hypertensive heart and chronic kidney disease with heart failure and stage 1 through stage 4 chronic kidney disease, or unspecified chronic kidney disease: Secondary | ICD-10-CM | POA: Diagnosis not present

## 2023-06-25 DIAGNOSIS — E1122 Type 2 diabetes mellitus with diabetic chronic kidney disease: Secondary | ICD-10-CM | POA: Diagnosis not present

## 2023-06-25 DIAGNOSIS — I509 Heart failure, unspecified: Secondary | ICD-10-CM | POA: Diagnosis not present

## 2023-06-25 DIAGNOSIS — L89622 Pressure ulcer of left heel, stage 2: Secondary | ICD-10-CM | POA: Diagnosis not present

## 2023-06-25 DIAGNOSIS — L89892 Pressure ulcer of other site, stage 2: Secondary | ICD-10-CM | POA: Diagnosis not present

## 2023-06-26 DIAGNOSIS — L89892 Pressure ulcer of other site, stage 2: Secondary | ICD-10-CM | POA: Diagnosis not present

## 2023-06-26 DIAGNOSIS — I509 Heart failure, unspecified: Secondary | ICD-10-CM | POA: Diagnosis not present

## 2023-06-26 DIAGNOSIS — M170 Bilateral primary osteoarthritis of knee: Secondary | ICD-10-CM | POA: Diagnosis not present

## 2023-06-26 DIAGNOSIS — I130 Hypertensive heart and chronic kidney disease with heart failure and stage 1 through stage 4 chronic kidney disease, or unspecified chronic kidney disease: Secondary | ICD-10-CM | POA: Diagnosis not present

## 2023-06-26 DIAGNOSIS — L89622 Pressure ulcer of left heel, stage 2: Secondary | ICD-10-CM | POA: Diagnosis not present

## 2023-06-26 DIAGNOSIS — E1122 Type 2 diabetes mellitus with diabetic chronic kidney disease: Secondary | ICD-10-CM | POA: Diagnosis not present

## 2023-06-27 ENCOUNTER — Other Ambulatory Visit: Payer: Self-pay | Admitting: Cardiovascular Disease

## 2023-06-27 ENCOUNTER — Encounter: Payer: Self-pay | Admitting: Internal Medicine

## 2023-07-01 DIAGNOSIS — I130 Hypertensive heart and chronic kidney disease with heart failure and stage 1 through stage 4 chronic kidney disease, or unspecified chronic kidney disease: Secondary | ICD-10-CM | POA: Diagnosis not present

## 2023-07-01 DIAGNOSIS — M170 Bilateral primary osteoarthritis of knee: Secondary | ICD-10-CM | POA: Diagnosis not present

## 2023-07-01 DIAGNOSIS — E1122 Type 2 diabetes mellitus with diabetic chronic kidney disease: Secondary | ICD-10-CM | POA: Diagnosis not present

## 2023-07-01 DIAGNOSIS — I509 Heart failure, unspecified: Secondary | ICD-10-CM | POA: Diagnosis not present

## 2023-07-01 DIAGNOSIS — L89622 Pressure ulcer of left heel, stage 2: Secondary | ICD-10-CM | POA: Diagnosis not present

## 2023-07-01 DIAGNOSIS — L89892 Pressure ulcer of other site, stage 2: Secondary | ICD-10-CM | POA: Diagnosis not present

## 2023-07-02 DIAGNOSIS — M48062 Spinal stenosis, lumbar region with neurogenic claudication: Secondary | ICD-10-CM | POA: Diagnosis not present

## 2023-07-02 DIAGNOSIS — M1712 Unilateral primary osteoarthritis, left knee: Secondary | ICD-10-CM | POA: Diagnosis not present

## 2023-07-04 DIAGNOSIS — E1122 Type 2 diabetes mellitus with diabetic chronic kidney disease: Secondary | ICD-10-CM | POA: Diagnosis not present

## 2023-07-04 DIAGNOSIS — L89892 Pressure ulcer of other site, stage 2: Secondary | ICD-10-CM | POA: Diagnosis not present

## 2023-07-04 DIAGNOSIS — I509 Heart failure, unspecified: Secondary | ICD-10-CM | POA: Diagnosis not present

## 2023-07-04 DIAGNOSIS — I130 Hypertensive heart and chronic kidney disease with heart failure and stage 1 through stage 4 chronic kidney disease, or unspecified chronic kidney disease: Secondary | ICD-10-CM | POA: Diagnosis not present

## 2023-07-04 DIAGNOSIS — M170 Bilateral primary osteoarthritis of knee: Secondary | ICD-10-CM | POA: Diagnosis not present

## 2023-07-04 DIAGNOSIS — L89622 Pressure ulcer of left heel, stage 2: Secondary | ICD-10-CM | POA: Diagnosis not present

## 2023-07-08 ENCOUNTER — Ambulatory Visit (INDEPENDENT_AMBULATORY_CARE_PROVIDER_SITE_OTHER): Admitting: Orthopedic Surgery

## 2023-07-08 DIAGNOSIS — S81802D Unspecified open wound, left lower leg, subsequent encounter: Secondary | ICD-10-CM

## 2023-07-08 DIAGNOSIS — L89622 Pressure ulcer of left heel, stage 2: Secondary | ICD-10-CM | POA: Diagnosis not present

## 2023-07-08 DIAGNOSIS — E1122 Type 2 diabetes mellitus with diabetic chronic kidney disease: Secondary | ICD-10-CM | POA: Diagnosis not present

## 2023-07-08 DIAGNOSIS — I509 Heart failure, unspecified: Secondary | ICD-10-CM | POA: Diagnosis not present

## 2023-07-08 DIAGNOSIS — M170 Bilateral primary osteoarthritis of knee: Secondary | ICD-10-CM | POA: Diagnosis not present

## 2023-07-08 DIAGNOSIS — I739 Peripheral vascular disease, unspecified: Secondary | ICD-10-CM | POA: Diagnosis not present

## 2023-07-08 DIAGNOSIS — Z89431 Acquired absence of right foot: Secondary | ICD-10-CM | POA: Diagnosis not present

## 2023-07-08 DIAGNOSIS — I130 Hypertensive heart and chronic kidney disease with heart failure and stage 1 through stage 4 chronic kidney disease, or unspecified chronic kidney disease: Secondary | ICD-10-CM | POA: Diagnosis not present

## 2023-07-08 DIAGNOSIS — L89892 Pressure ulcer of other site, stage 2: Secondary | ICD-10-CM | POA: Diagnosis not present

## 2023-07-09 DIAGNOSIS — M5416 Radiculopathy, lumbar region: Secondary | ICD-10-CM | POA: Diagnosis not present

## 2023-07-10 DIAGNOSIS — E1122 Type 2 diabetes mellitus with diabetic chronic kidney disease: Secondary | ICD-10-CM | POA: Diagnosis not present

## 2023-07-10 DIAGNOSIS — M170 Bilateral primary osteoarthritis of knee: Secondary | ICD-10-CM | POA: Diagnosis not present

## 2023-07-10 DIAGNOSIS — I509 Heart failure, unspecified: Secondary | ICD-10-CM | POA: Diagnosis not present

## 2023-07-10 DIAGNOSIS — L89892 Pressure ulcer of other site, stage 2: Secondary | ICD-10-CM | POA: Diagnosis not present

## 2023-07-10 DIAGNOSIS — L89622 Pressure ulcer of left heel, stage 2: Secondary | ICD-10-CM | POA: Diagnosis not present

## 2023-07-10 DIAGNOSIS — I130 Hypertensive heart and chronic kidney disease with heart failure and stage 1 through stage 4 chronic kidney disease, or unspecified chronic kidney disease: Secondary | ICD-10-CM | POA: Diagnosis not present

## 2023-07-11 ENCOUNTER — Other Ambulatory Visit (HOSPITAL_COMMUNITY): Payer: Self-pay

## 2023-07-11 DIAGNOSIS — M069 Rheumatoid arthritis, unspecified: Secondary | ICD-10-CM | POA: Diagnosis not present

## 2023-07-11 DIAGNOSIS — M1990 Unspecified osteoarthritis, unspecified site: Secondary | ICD-10-CM | POA: Diagnosis not present

## 2023-07-11 DIAGNOSIS — M170 Bilateral primary osteoarthritis of knee: Secondary | ICD-10-CM | POA: Diagnosis not present

## 2023-07-11 DIAGNOSIS — K219 Gastro-esophageal reflux disease without esophagitis: Secondary | ICD-10-CM | POA: Diagnosis not present

## 2023-07-11 DIAGNOSIS — I052 Rheumatic mitral stenosis with insufficiency: Secondary | ICD-10-CM | POA: Diagnosis not present

## 2023-07-11 DIAGNOSIS — E875 Hyperkalemia: Secondary | ICD-10-CM | POA: Diagnosis not present

## 2023-07-11 DIAGNOSIS — I509 Heart failure, unspecified: Secondary | ICD-10-CM | POA: Diagnosis not present

## 2023-07-11 DIAGNOSIS — E1122 Type 2 diabetes mellitus with diabetic chronic kidney disease: Secondary | ICD-10-CM | POA: Diagnosis not present

## 2023-07-11 DIAGNOSIS — I700 Atherosclerosis of aorta: Secondary | ICD-10-CM | POA: Diagnosis not present

## 2023-07-11 DIAGNOSIS — L89892 Pressure ulcer of other site, stage 2: Secondary | ICD-10-CM | POA: Diagnosis not present

## 2023-07-11 DIAGNOSIS — E785 Hyperlipidemia, unspecified: Secondary | ICD-10-CM | POA: Diagnosis not present

## 2023-07-11 DIAGNOSIS — L89622 Pressure ulcer of left heel, stage 2: Secondary | ICD-10-CM | POA: Diagnosis not present

## 2023-07-11 DIAGNOSIS — I480 Paroxysmal atrial fibrillation: Secondary | ICD-10-CM | POA: Diagnosis not present

## 2023-07-11 DIAGNOSIS — N281 Cyst of kidney, acquired: Secondary | ICD-10-CM | POA: Diagnosis not present

## 2023-07-11 DIAGNOSIS — D631 Anemia in chronic kidney disease: Secondary | ICD-10-CM | POA: Diagnosis not present

## 2023-07-11 DIAGNOSIS — M810 Age-related osteoporosis without current pathological fracture: Secondary | ICD-10-CM | POA: Diagnosis not present

## 2023-07-11 DIAGNOSIS — E1142 Type 2 diabetes mellitus with diabetic polyneuropathy: Secondary | ICD-10-CM | POA: Diagnosis not present

## 2023-07-11 DIAGNOSIS — K5730 Diverticulosis of large intestine without perforation or abscess without bleeding: Secondary | ICD-10-CM | POA: Diagnosis not present

## 2023-07-11 DIAGNOSIS — I2510 Atherosclerotic heart disease of native coronary artery without angina pectoris: Secondary | ICD-10-CM | POA: Diagnosis not present

## 2023-07-11 DIAGNOSIS — E1151 Type 2 diabetes mellitus with diabetic peripheral angiopathy without gangrene: Secondary | ICD-10-CM | POA: Diagnosis not present

## 2023-07-11 DIAGNOSIS — N1832 Chronic kidney disease, stage 3b: Secondary | ICD-10-CM | POA: Diagnosis not present

## 2023-07-11 DIAGNOSIS — I890 Lymphedema, not elsewhere classified: Secondary | ICD-10-CM | POA: Diagnosis not present

## 2023-07-11 DIAGNOSIS — I872 Venous insufficiency (chronic) (peripheral): Secondary | ICD-10-CM | POA: Diagnosis not present

## 2023-07-11 DIAGNOSIS — H9193 Unspecified hearing loss, bilateral: Secondary | ICD-10-CM | POA: Diagnosis not present

## 2023-07-11 DIAGNOSIS — I130 Hypertensive heart and chronic kidney disease with heart failure and stage 1 through stage 4 chronic kidney disease, or unspecified chronic kidney disease: Secondary | ICD-10-CM | POA: Diagnosis not present

## 2023-07-16 ENCOUNTER — Encounter: Payer: Self-pay | Admitting: Orthopedic Surgery

## 2023-07-16 DIAGNOSIS — I509 Heart failure, unspecified: Secondary | ICD-10-CM | POA: Diagnosis not present

## 2023-07-16 DIAGNOSIS — M170 Bilateral primary osteoarthritis of knee: Secondary | ICD-10-CM | POA: Diagnosis not present

## 2023-07-16 DIAGNOSIS — I130 Hypertensive heart and chronic kidney disease with heart failure and stage 1 through stage 4 chronic kidney disease, or unspecified chronic kidney disease: Secondary | ICD-10-CM | POA: Diagnosis not present

## 2023-07-16 DIAGNOSIS — L89622 Pressure ulcer of left heel, stage 2: Secondary | ICD-10-CM | POA: Diagnosis not present

## 2023-07-16 DIAGNOSIS — L89892 Pressure ulcer of other site, stage 2: Secondary | ICD-10-CM | POA: Diagnosis not present

## 2023-07-16 DIAGNOSIS — E1122 Type 2 diabetes mellitus with diabetic chronic kidney disease: Secondary | ICD-10-CM | POA: Diagnosis not present

## 2023-07-16 NOTE — Progress Notes (Signed)
 Office Visit Note   Patient: Shelby Walker           Date of Birth: 1933-05-03           MRN: [id number] Visit Date: 07/08/2023              Requested by: Rosslyn Coons, MD [address] [place],  [place] [address] PCP: Rosslyn Coons, MD  Chief Complaint  Patient presents with   Right Foot - Follow-up   Left Foot - Follow-up      HPI: Patient is an 89 year old woman with peripheral arterial disease who is currently ambulating with a rolling walker and has used nitroglycerin  patches to help with the microcirculation.  She has been using half a patch on each foot.  Assessment & Plan: Visit Diagnoses:  1. Wound of left lower extremity, subsequent encounter   2. S/P transmetatarsal amputation of foot, right (HCC)   3. PAD (peripheral artery disease) (HCC)     Plan: Patient will discontinue the nitroglycerin  patch continue with protective shoe wear and orthotics.  Follow-Up Instructions: Return if symptoms worsen or fail to improve.   Ortho Exam  Patient is alert, oriented, no adenopathy, well-dressed, normal affect, normal respiratory effort. Examination patient has venous stasis changes in both legs but no open ulcers.  She has calluses on both feet that are pared there are no open ulcers no cellulitis no drainage.  Imaging: No results found. No images are attached to the encounter.  Labs: Lab Results  Component Value Date   HGBA1C 9.2 (H) 04/04/2023   HGBA1C 9.2 (H) 08/08/2022   HGBA1C 7.3 (H) 01/24/2022   ESRSEDRATE 31 (H) 05/01/2022   ESRSEDRATE 22 03/27/2022   ESRSEDRATE 18 02/09/2022   CRP 8.0 (H) 08/12/2022   CRP 8.1 (H) 08/11/2022   CRP 8.5 (H) 08/10/2022   REPTSTATUS 08/10/2022 FINAL 08/07/2022   CULT (A) 08/07/2022    ENTEROBACTER CLOACAE SUSCEPTIBILITIES PERFORMED ON PREVIOUS CULTURE WITHIN THE LAST 5 DAYS. Performed at [facility], [address] [place], [place] [address]    LABORGA ENTEROBACTER CLOACAE 08/07/2022     Lab Results   Component Value Date   ALBUMIN  2.8 (L) 04/05/2023   ALBUMIN  3.4 (L) 04/04/2023   ALBUMIN  3.3 (L) 04/03/2023   PREALBUMIN 18 02/05/2022   PREALBUMIN 15.2 (L) 01/16/2021    Lab Results  Component Value Date   MG 2.4 04/05/2023   MG 2.3 08/12/2022   MG 2.3 08/11/2022   No results found for: "VD25OH"  Lab Results  Component Value Date   PREALBUMIN 18 02/05/2022   PREALBUMIN 15.2 (L) 01/16/2021      Latest Ref Rng & Units 04/05/2023    5:20 AM 04/04/2023    4:14 AM 04/03/2023    6:23 PM  CBC EXTENDED  WBC 4.0 - 10.5 K/uL 10.8  9.9  11.4   RBC 3.87 - 5.11 MIL/uL 3.31  3.85  3.70   Hemoglobin 12.0 - 15.0 g/dL 29.5  28.4  13.2   HCT 36.0 - 46.0 % 32.1  37.8  36.1   Platelets 150 - 400 K/uL 177  209  227   NEUT# 1.7 - 7.7 K/uL  6.8  9.5   Lymph# 0.7 - 4.0 K/uL  1.7  1.2      There is no height or weight on file to calculate BMI.  Orders:  No orders of the defined types were placed in this encounter.  No orders of the defined types were placed  in this encounter.    Procedures: No procedures performed  Clinical Data: No additional findings.  ROS:  All other systems negative, except as noted in the HPI. Review of Systems  Objective: Vital Signs: There were no vitals taken for this visit.  Specialty Comments:  EXAM: MRI LUMBAR SPINE WITHOUT CONTRAST   TECHNIQUE: Multiplanar, multisequence MR imaging of the lumbar spine was performed. No intravenous contrast was administered.   COMPARISON:  RADIOGRAPHY OF THE LUMBAR DATED 11/16/2020. RADIOGRAPHY OF THE LUMBAR DATED 08/07/2018.   FINDINGS: Segmentation:  Standard.   Alignment: Grade 1 anterolisthesis of L3 on L4, L4 on L5 and L5-S1 secondary to facet disease.   Vertebrae: No acute fracture, evidence of discitis, or aggressive bone lesion.   Conus medullaris and cauda equina: Conus extends to the L1-2 level. Conus and cauda equina appear normal.   Paraspinal and other soft tissues: No acute paraspinal  abnormality. Right renal cyst.   Disc levels:   Disc spaces: Degenerative disease with disc height loss at L3-4, L4-5 and L5-S1.   T12-L1: Minimal broad-based disc bulge. No foraminal or central canal stenosis.   L1-L2: No significant disc bulge. No neural foraminal stenosis. No central canal stenosis.   L2-L3: Minimal broad-based disc bulge. Mild bilateral facet arthropathy. No foraminal or central canal stenosis.   L3-L4: Broad-based disc bulge with a broad left paracentral disc protrusion with mass effect on the left intraspinal L4 nerve root. Moderate bilateral facet arthropathy. Moderate-severe spinal stenosis. Left subarticular recess stenosis. Moderate left foraminal stenosis. Mild right foraminal stenosis.   L4-L5: Broad-based disc bulge with a broad central disc protrusion. Severe bilateral facet arthropathy with ligamentum flavum infolding. Severe spinal stenosis. Mild right and severe left foraminal stenosis.   L5-S1: Mild broad-based disc bulge with a broad shallow right paracentral disc protrusion with mass effect on the right intraspinal S1 nerve root. Mild bilateral foraminal stenosis. Mild bilateral facet arthropathy. No spinal stenosis.   IMPRESSION: 1. Lumbar spine spondylosis as described above most severe at L3-4, L4-5 and L5-S1. 2. No acute osseous injury of the lumbar spine.     Electronically Signed   By: Onnie Bilis M.D.   On: 11/23/2020 08:11  PMFS History: Patient Active Problem List   Diagnosis Date Noted   Elevated LFTs 04/04/2023   Anemia 04/04/2023   Choledocholithiasis 04/03/2023   Acute biliary pancreatitis 08/07/2022   Sepsis (HCC) 08/07/2022   Sacral wound 05/14/2022   Coronary artery disease involving native coronary artery of native heart without angina pectoris 02/21/2022   Discitis of lumbar region 02/05/2022   Herpes zoster without complication 02/05/2022   Clostridium difficile colitis 02/05/2022   Severe sepsis with  acute organ dysfunction due to Gram positive bacteria (HCC) 02/02/2022   Pacemaker infection (HCC) 02/01/2022   Periostitis of lumbar spine without osteomyelitis (HCC) 02/01/2022   Acute midline low back pain without sciatica 01/26/2022   MSSA bacteremia 01/25/2022   Presence of cardiac pacemaker 01/25/2022   Severe sepsis with septic shock (HCC) 01/24/2022   AKI (acute kidney injury) (HCC) 01/24/2022   Sinus pause 01/04/2022   Acute viral bronchitis 02/06/2021   Acute blood loss anemia    Postoperative pain    S/P transmetatarsal amputation of foot, right (HCC) 01/24/2021   Protein-calorie malnutrition, severe 01/17/2021   Diabetic foot infection (HCC) 01/16/2021   Rheumatoid arthritis (HCC) 01/16/2021   Stage 3b chronic kidney disease (CKD) (HCC) 01/16/2021   DNR (do not resuscitate) 01/16/2021   Decreased anal sphincter tone 12/13/2018  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   Irritable bowel syndrome with diarrhea - post infectious 12/12/2018   Full incontinence of feces and fecal smearing 12/12/2018   Perianal dermatitis 12/12/2018   Uncontrolled type 2 diabetes mellitus with hyperglycemia (HCC) 10/26/2018   Long-term use of immunosuppressant medication-Enbrel 10/26/2018   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 08/27/2018   Thrombocytopenia (HCC)    Essential hypertension    Dyspnea on exertion    Steroid-induced hyperglycemia    Fatigue 08/06/2018   Paroxysmal atrial fibrillation (HCC)    Mixed hyperlipidemia 09/29/2016   Stable angina (HCC) 09/29/2016   PAD (peripheral artery disease) (HCC) 08/09/2016   Preoperative clearance 05/21/2012   Precordial chest pain    Palpitations    Diabetes mellitus (HCC)    GERD (gastroesophageal reflux disease)    Ejection fraction    Carotid artery disease (HCC)    Mitral regurgitation 04/21/2009   PSORIASIS 02/09/2009   Rheumatoid arthritis flare (HCC) 02/09/2009   Past Medical History:  Diagnosis Date   Anemia    Bruit     Carotid Doppler showed no significant abnormality     9per patient)   C. difficile colitis 07/2018   with severe sepsis   Chest pain, unspecified    Nuclear, May, 2008, no scar or ischemia   Chronic kidney disease    Diabetes mellitus    Diverticulosis    Dyslipidemia    Dysrhythmia    a-fib   Ejection fraction    EF 55-60%, echo, February, 2011 // Echocardiogram 8/21: EF 60-65, no RWMA, Gr 1 DD, GLS -14%, normal RVSF, mild LAE, trivial MR, mild MS (mean 4 mmHg), RVSP 23.4    GERD (gastroesophageal reflux disease)    History of kidney stones    removed   HLD (hyperlipidemia)    Implantable loop recorder present    Memory loss    Mitral regurgitation 04/21/2009   mild,  echo, February, 2011   Osteoporosis    Palpitations    possible very brief atrial fibrillation on monitor and possible reentrant tachycardia   Pneumonia    PONV (postoperative nausea and vomiting)    Psoriasis    Rheumatoid arthritis (HCC)     Family History  Problem Relation Age of Onset   Heart failure Father    Heart attack Father    Diabetes Father    Heart attack Mother    Heart failure Mother    Diabetes Mother    Stroke Sister     Past Surgical History:  Procedure Laterality Date   ABDOMINAL AORTOGRAM W/LOWER EXTREMITY Right 10/19/2020   Procedure: ABDOMINAL AORTOGRAM W/LOWER EXTREMITY;  Surgeon: Wenona Hamilton, MD;  [facility]: [facility];  Service: Cardiovascular;  Laterality: Right;   CHOLECYSTECTOMY N/A 08/09/2022   Procedure: LAPAROSCOPIC CHOLECYSTECTOMY WITH INTRAOPERATIVE CHOLANGIOGRAM;  Surgeon: Junie Olds, MD;  Location: [facility];  Service: General;  Laterality: N/A;   ENDOSCOPIC RETROGRADE CHOLANGIOPANCREATOGRAPHY (ERCP) WITH PROPOFOL  N/A 04/05/2023   Procedure: ENDOSCOPIC RETROGRADE CHOLANGIOPANCREATOGRAPHY (ERCP) WITH PROPOFOL ;  Surgeon: Tobin Forts, MD;  Location: [facility];  Service: Gastroenterology;  Laterality: N/A;   FRACTURE SURGERY     KYPHOPLASTY N/A  04/25/2023   Procedure: LUMBAR 1 KYPHOPLASTY;  Surgeon: Virl Grimes, MD;  Location: [facility];  Service: Orthopedics;  Laterality: N/A;   LOOP Recorder placement     LOOP RECORDER REMOVAL N/A 01/05/2022   Procedure: LOOP RECORDER REMOVAL;  Surgeon: Tammie Fall, MD;  Location: [facility];  Service: Cardiovascular;  Laterality: N/A;   PACEMAKER IMPLANT N/A 01/05/2022   Procedure: [employer] PACEMAKER IMPLANT;  Surgeon: Tammie Fall, MD;  [facility]: [facility];  Service: Cardiovascular;  Laterality: N/A;   PPM GENERATOR REMOVAL N/A 01/29/2022   Procedure: PPM GENERATOR REMOVAL;  Surgeon: Tammie Fall, MD;  Location: [facility];  Service: Cardiovascular;  Laterality: N/A;   REMOVAL OF STONES  04/05/2023   Procedure: REMOVAL OF STONES;  Surgeon: Tobin Forts, MD;  Location: [facility];  Service: Gastroenterology;;   [address]  04/05/2023   Procedure: [address];  Surgeon: Tobin Forts, MD;  Location: [facility];  Service: Gastroenterology;;   TEE WITHOUT CARDIOVERSION N/A 01/29/2022   Procedure: TRANSESOPHAGEAL ECHOCARDIOGRAM (TEE);  Surgeon: Harrold Lincoln, MD;  Location: [facility];  Service: Cardiovascular;  Laterality: N/A;   TRANSMETATARSAL AMPUTATION Right 01/18/2021   Procedure: TRANSMETATARSAL AMPUTATION;  Surgeon: Amada Backer, MD;  Location: [facility] OR;  Service: Orthopedics;  Laterality: Right;   Social History   Occupational History    Employer: RETIRED  Tobacco Use   Smoking status: Never   Smokeless tobacco: Never  Vaping Use   Vaping status: Never Used  Substance and Sexual Activity   Alcohol  use: No    Alcohol /week: 0.0 standard drinks of alcohol    Drug use: No   Sexual activity: Yes    Birth control/protection: Post-menopausal

## 2023-07-17 DIAGNOSIS — E1122 Type 2 diabetes mellitus with diabetic chronic kidney disease: Secondary | ICD-10-CM | POA: Diagnosis not present

## 2023-07-17 DIAGNOSIS — L89892 Pressure ulcer of other site, stage 2: Secondary | ICD-10-CM | POA: Diagnosis not present

## 2023-07-17 DIAGNOSIS — I130 Hypertensive heart and chronic kidney disease with heart failure and stage 1 through stage 4 chronic kidney disease, or unspecified chronic kidney disease: Secondary | ICD-10-CM | POA: Diagnosis not present

## 2023-07-17 DIAGNOSIS — I509 Heart failure, unspecified: Secondary | ICD-10-CM | POA: Diagnosis not present

## 2023-07-17 DIAGNOSIS — M170 Bilateral primary osteoarthritis of knee: Secondary | ICD-10-CM | POA: Diagnosis not present

## 2023-07-17 DIAGNOSIS — L89622 Pressure ulcer of left heel, stage 2: Secondary | ICD-10-CM | POA: Diagnosis not present

## 2023-07-22 ENCOUNTER — Ambulatory Visit (INDEPENDENT_AMBULATORY_CARE_PROVIDER_SITE_OTHER): Payer: Medicare Other

## 2023-07-22 DIAGNOSIS — I4819 Other persistent atrial fibrillation: Secondary | ICD-10-CM | POA: Diagnosis not present

## 2023-07-22 LAB — CUP PACEART REMOTE DEVICE CHECK
Date Time Interrogation Session: 20250518233315
Implantable Pulse Generator Implant Date: 20240708

## 2023-07-26 DIAGNOSIS — E1122 Type 2 diabetes mellitus with diabetic chronic kidney disease: Secondary | ICD-10-CM | POA: Diagnosis not present

## 2023-07-26 DIAGNOSIS — I509 Heart failure, unspecified: Secondary | ICD-10-CM | POA: Diagnosis not present

## 2023-07-26 DIAGNOSIS — L89892 Pressure ulcer of other site, stage 2: Secondary | ICD-10-CM | POA: Diagnosis not present

## 2023-07-26 DIAGNOSIS — M170 Bilateral primary osteoarthritis of knee: Secondary | ICD-10-CM | POA: Diagnosis not present

## 2023-07-26 DIAGNOSIS — I130 Hypertensive heart and chronic kidney disease with heart failure and stage 1 through stage 4 chronic kidney disease, or unspecified chronic kidney disease: Secondary | ICD-10-CM | POA: Diagnosis not present

## 2023-07-26 DIAGNOSIS — L89622 Pressure ulcer of left heel, stage 2: Secondary | ICD-10-CM | POA: Diagnosis not present

## 2023-07-29 ENCOUNTER — Ambulatory Visit: Payer: Self-pay | Admitting: Cardiovascular Disease

## 2023-07-31 DIAGNOSIS — E1122 Type 2 diabetes mellitus with diabetic chronic kidney disease: Secondary | ICD-10-CM | POA: Diagnosis not present

## 2023-07-31 DIAGNOSIS — L89622 Pressure ulcer of left heel, stage 2: Secondary | ICD-10-CM | POA: Diagnosis not present

## 2023-07-31 DIAGNOSIS — I509 Heart failure, unspecified: Secondary | ICD-10-CM | POA: Diagnosis not present

## 2023-07-31 DIAGNOSIS — L89892 Pressure ulcer of other site, stage 2: Secondary | ICD-10-CM | POA: Diagnosis not present

## 2023-07-31 DIAGNOSIS — I130 Hypertensive heart and chronic kidney disease with heart failure and stage 1 through stage 4 chronic kidney disease, or unspecified chronic kidney disease: Secondary | ICD-10-CM | POA: Diagnosis not present

## 2023-07-31 DIAGNOSIS — M170 Bilateral primary osteoarthritis of knee: Secondary | ICD-10-CM | POA: Diagnosis not present

## 2023-08-05 ENCOUNTER — Encounter: Payer: Self-pay | Admitting: Orthopedic Surgery

## 2023-08-05 DIAGNOSIS — M170 Bilateral primary osteoarthritis of knee: Secondary | ICD-10-CM | POA: Diagnosis not present

## 2023-08-05 DIAGNOSIS — I130 Hypertensive heart and chronic kidney disease with heart failure and stage 1 through stage 4 chronic kidney disease, or unspecified chronic kidney disease: Secondary | ICD-10-CM | POA: Diagnosis not present

## 2023-08-05 DIAGNOSIS — I509 Heart failure, unspecified: Secondary | ICD-10-CM | POA: Diagnosis not present

## 2023-08-05 DIAGNOSIS — L89622 Pressure ulcer of left heel, stage 2: Secondary | ICD-10-CM | POA: Diagnosis not present

## 2023-08-05 DIAGNOSIS — E1122 Type 2 diabetes mellitus with diabetic chronic kidney disease: Secondary | ICD-10-CM | POA: Diagnosis not present

## 2023-08-05 DIAGNOSIS — L89892 Pressure ulcer of other site, stage 2: Secondary | ICD-10-CM | POA: Diagnosis not present

## 2023-08-06 NOTE — Progress Notes (Signed)
 [facility] Summary Report / Loop Recorder

## 2023-08-10 DIAGNOSIS — I872 Venous insufficiency (chronic) (peripheral): Secondary | ICD-10-CM | POA: Diagnosis not present

## 2023-08-10 DIAGNOSIS — L89622 Pressure ulcer of left heel, stage 2: Secondary | ICD-10-CM | POA: Diagnosis not present

## 2023-08-10 DIAGNOSIS — E1151 Type 2 diabetes mellitus with diabetic peripheral angiopathy without gangrene: Secondary | ICD-10-CM | POA: Diagnosis not present

## 2023-08-10 DIAGNOSIS — N281 Cyst of kidney, acquired: Secondary | ICD-10-CM | POA: Diagnosis not present

## 2023-08-10 DIAGNOSIS — M810 Age-related osteoporosis without current pathological fracture: Secondary | ICD-10-CM | POA: Diagnosis not present

## 2023-08-10 DIAGNOSIS — I130 Hypertensive heart and chronic kidney disease with heart failure and stage 1 through stage 4 chronic kidney disease, or unspecified chronic kidney disease: Secondary | ICD-10-CM | POA: Diagnosis not present

## 2023-08-10 DIAGNOSIS — K5730 Diverticulosis of large intestine without perforation or abscess without bleeding: Secondary | ICD-10-CM | POA: Diagnosis not present

## 2023-08-10 DIAGNOSIS — I700 Atherosclerosis of aorta: Secondary | ICD-10-CM | POA: Diagnosis not present

## 2023-08-10 DIAGNOSIS — H9193 Unspecified hearing loss, bilateral: Secondary | ICD-10-CM | POA: Diagnosis not present

## 2023-08-10 DIAGNOSIS — M069 Rheumatoid arthritis, unspecified: Secondary | ICD-10-CM | POA: Diagnosis not present

## 2023-08-10 DIAGNOSIS — I052 Rheumatic mitral stenosis with insufficiency: Secondary | ICD-10-CM | POA: Diagnosis not present

## 2023-08-10 DIAGNOSIS — N1832 Chronic kidney disease, stage 3b: Secondary | ICD-10-CM | POA: Diagnosis not present

## 2023-08-10 DIAGNOSIS — L89892 Pressure ulcer of other site, stage 2: Secondary | ICD-10-CM | POA: Diagnosis not present

## 2023-08-10 DIAGNOSIS — I509 Heart failure, unspecified: Secondary | ICD-10-CM | POA: Diagnosis not present

## 2023-08-10 DIAGNOSIS — I480 Paroxysmal atrial fibrillation: Secondary | ICD-10-CM | POA: Diagnosis not present

## 2023-08-10 DIAGNOSIS — K219 Gastro-esophageal reflux disease without esophagitis: Secondary | ICD-10-CM | POA: Diagnosis not present

## 2023-08-10 DIAGNOSIS — I2510 Atherosclerotic heart disease of native coronary artery without angina pectoris: Secondary | ICD-10-CM | POA: Diagnosis not present

## 2023-08-10 DIAGNOSIS — E785 Hyperlipidemia, unspecified: Secondary | ICD-10-CM | POA: Diagnosis not present

## 2023-08-10 DIAGNOSIS — E1142 Type 2 diabetes mellitus with diabetic polyneuropathy: Secondary | ICD-10-CM | POA: Diagnosis not present

## 2023-08-10 DIAGNOSIS — D631 Anemia in chronic kidney disease: Secondary | ICD-10-CM | POA: Diagnosis not present

## 2023-08-10 DIAGNOSIS — E1122 Type 2 diabetes mellitus with diabetic chronic kidney disease: Secondary | ICD-10-CM | POA: Diagnosis not present

## 2023-08-10 DIAGNOSIS — M170 Bilateral primary osteoarthritis of knee: Secondary | ICD-10-CM | POA: Diagnosis not present

## 2023-08-10 DIAGNOSIS — M47816 Spondylosis without myelopathy or radiculopathy, lumbar region: Secondary | ICD-10-CM | POA: Diagnosis not present

## 2023-08-10 DIAGNOSIS — I890 Lymphedema, not elsewhere classified: Secondary | ICD-10-CM | POA: Diagnosis not present

## 2023-08-10 DIAGNOSIS — L409 Psoriasis, unspecified: Secondary | ICD-10-CM | POA: Diagnosis not present

## 2023-08-13 DIAGNOSIS — M810 Age-related osteoporosis without current pathological fracture: Secondary | ICD-10-CM | POA: Diagnosis not present

## 2023-08-13 DIAGNOSIS — I130 Hypertensive heart and chronic kidney disease with heart failure and stage 1 through stage 4 chronic kidney disease, or unspecified chronic kidney disease: Secondary | ICD-10-CM | POA: Diagnosis not present

## 2023-08-13 DIAGNOSIS — E1140 Type 2 diabetes mellitus with diabetic neuropathy, unspecified: Secondary | ICD-10-CM | POA: Diagnosis not present

## 2023-08-13 DIAGNOSIS — N1832 Chronic kidney disease, stage 3b: Secondary | ICD-10-CM | POA: Diagnosis not present

## 2023-08-14 ENCOUNTER — Other Ambulatory Visit: Payer: Self-pay | Admitting: Internal Medicine

## 2023-08-14 DIAGNOSIS — I739 Peripheral vascular disease, unspecified: Secondary | ICD-10-CM

## 2023-08-14 DIAGNOSIS — E782 Mixed hyperlipidemia: Secondary | ICD-10-CM

## 2023-08-16 ENCOUNTER — Telehealth: Payer: Self-pay | Admitting: Pharmacy Technician

## 2023-08-16 DIAGNOSIS — E1122 Type 2 diabetes mellitus with diabetic chronic kidney disease: Secondary | ICD-10-CM | POA: Diagnosis not present

## 2023-08-16 DIAGNOSIS — I509 Heart failure, unspecified: Secondary | ICD-10-CM | POA: Diagnosis not present

## 2023-08-16 DIAGNOSIS — L89892 Pressure ulcer of other site, stage 2: Secondary | ICD-10-CM | POA: Diagnosis not present

## 2023-08-16 DIAGNOSIS — L89622 Pressure ulcer of left heel, stage 2: Secondary | ICD-10-CM | POA: Diagnosis not present

## 2023-08-16 DIAGNOSIS — M1712 Unilateral primary osteoarthritis, left knee: Secondary | ICD-10-CM | POA: Diagnosis not present

## 2023-08-16 DIAGNOSIS — N1832 Chronic kidney disease, stage 3b: Secondary | ICD-10-CM | POA: Diagnosis not present

## 2023-08-16 DIAGNOSIS — I130 Hypertensive heart and chronic kidney disease with heart failure and stage 1 through stage 4 chronic kidney disease, or unspecified chronic kidney disease: Secondary | ICD-10-CM | POA: Diagnosis not present

## 2023-08-16 NOTE — Telephone Encounter (Signed)
 Auth Submission: NO AUTH NEEDED Site of care: Site of care: [facility] Payer: medicare [employer] [place] Medication & CPT/J Code(s) submitted: Prolia (Denosumab) [id number] Diagnosis Code:  Route of submission (phone, fax, portal):  Phone # Fax # Auth type: Buy/Bill HB Units/visits requested: 60mg  q6 months x 2 doses Reference number:  Approval from: 08/16/23 to 04/05/23

## 2023-08-19 ENCOUNTER — Ambulatory Visit (INDEPENDENT_AMBULATORY_CARE_PROVIDER_SITE_OTHER): Admitting: Orthopedic Surgery

## 2023-08-19 DIAGNOSIS — L570 Actinic keratosis: Secondary | ICD-10-CM | POA: Diagnosis not present

## 2023-08-19 DIAGNOSIS — D692 Other nonthrombocytopenic purpura: Secondary | ICD-10-CM | POA: Diagnosis not present

## 2023-08-19 DIAGNOSIS — I739 Peripheral vascular disease, unspecified: Secondary | ICD-10-CM

## 2023-08-19 DIAGNOSIS — H61002 Unspecified perichondritis of left external ear: Secondary | ICD-10-CM | POA: Diagnosis not present

## 2023-08-19 DIAGNOSIS — H61001 Unspecified perichondritis of right external ear: Secondary | ICD-10-CM | POA: Diagnosis not present

## 2023-08-19 DIAGNOSIS — Z85828 Personal history of other malignant neoplasm of skin: Secondary | ICD-10-CM | POA: Diagnosis not present

## 2023-08-19 DIAGNOSIS — Z89431 Acquired absence of right foot: Secondary | ICD-10-CM

## 2023-08-20 ENCOUNTER — Encounter: Payer: Self-pay | Admitting: Orthopedic Surgery

## 2023-08-20 NOTE — Progress Notes (Signed)
 Office Visit Note   Patient: Shelby Walker           Date of Birth: 1933/09/06           MRN: [id number] Visit Date: 08/19/2023              Requested by: Rosslyn Coons, MD [address] [place],  [place] [address] PCP: Rosslyn Coons, MD  Chief Complaint  Patient presents with   Left Foot - Wound Check      HPI: Patient is an 89 year old woman who is status post a right transmetatarsal amputation.  Patient has also had multiple ischemic ulcers she has been using Iodosorb dressing changes and a nitroglycerin  patch.  Assessment & Plan: Visit Diagnoses:  1. S/P transmetatarsal amputation of foot, right (HCC)   2. PAD (peripheral artery disease) (HCC)     Plan: Continue to use the nitroglycerin  patch until the wound is completely healed.  She will follow-up as needed.  Follow-Up Instructions: Return if symptoms worsen or fail to improve.   Ortho Exam  Patient is alert, oriented, no adenopathy, well-dressed, normal affect, normal respiratory effort. Examination the transmetatarsal amputation is well-healed.  Patient has had 2 ischemic ulcers over the first metatarsal head on the left and medial left heel.  These are essentially healed.  There is no cellulitis no drainage.    Imaging: No results found. No images are attached to the encounter.  Labs: Lab Results  Component Value Date   HGBA1C 9.2 (H) 04/04/2023   HGBA1C 9.2 (H) 08/08/2022   HGBA1C 7.3 (H) 01/24/2022   ESRSEDRATE 31 (H) 05/01/2022   ESRSEDRATE 22 03/27/2022   ESRSEDRATE 18 02/09/2022   CRP 8.0 (H) 08/12/2022   CRP 8.1 (H) 08/11/2022   CRP 8.5 (H) 08/10/2022   REPTSTATUS 08/10/2022 FINAL 08/07/2022   CULT (A) 08/07/2022    ENTEROBACTER CLOACAE SUSCEPTIBILITIES PERFORMED ON PREVIOUS CULTURE WITHIN THE LAST 5 DAYS. Performed at [facility], [address], [place], [place] [address]    LABORGA ENTEROBACTER CLOACAE 08/07/2022     Lab Results  Component Value Date   ALBUMIN  2.8 (L)  04/05/2023   ALBUMIN  3.4 (L) 04/04/2023   ALBUMIN  3.3 (L) 04/03/2023   PREALBUMIN 18 02/05/2022   PREALBUMIN 15.2 (L) 01/16/2021    Lab Results  Component Value Date   MG 2.4 04/05/2023   MG 2.3 08/12/2022   MG 2.3 08/11/2022   No results found for: VD25OH  Lab Results  Component Value Date   PREALBUMIN 18 02/05/2022   PREALBUMIN 15.2 (L) 01/16/2021      Latest Ref Rng & Units 04/05/2023    5:20 AM 04/04/2023    4:14 AM 04/03/2023    6:23 PM  CBC EXTENDED  WBC 4.0 - 10.5 K/uL 10.8  9.9  11.4   RBC 3.87 - 5.11 MIL/uL 3.31  3.85  3.70   Hemoglobin 12.0 - 15.0 g/dL 82.9  56.2  13.0   HCT 36.0 - 46.0 % 32.1  37.8  36.1   Platelets 150 - 400 K/uL 177  209  227   NEUT# 1.7 - 7.7 K/uL  6.8  9.5   Lymph# 0.7 - 4.0 K/uL  1.7  1.2      There is no height or weight on file to calculate BMI.  Orders:  No orders of the defined types were placed in this encounter.  No orders of the defined types were placed in this encounter.    Procedures: No procedures  performed  Clinical Data: No additional findings.  ROS:  All other systems negative, except as noted in the HPI. Review of Systems  Objective: Vital Signs: There were no vitals taken for this visit.  Specialty Comments:  EXAM: MRI LUMBAR SPINE WITHOUT CONTRAST   TECHNIQUE: Multiplanar, multisequence MR imaging of the lumbar spine was performed. No intravenous contrast was administered.   COMPARISON:  RADIOGRAPHY OF THE LUMBAR DATED 11/16/2020. RADIOGRAPHY OF THE LUMBAR DATED 08/07/2018.   FINDINGS: Segmentation:  Standard.   Alignment: Grade 1 anterolisthesis of L3 on L4, L4 on L5 and L5-S1 secondary to facet disease.   Vertebrae: No acute fracture, evidence of discitis, or aggressive bone lesion.   Conus medullaris and cauda equina: Conus extends to the L1-2 level. Conus and cauda equina appear normal.   Paraspinal and other soft tissues: No acute paraspinal abnormality. Right renal cyst.   Disc  levels:   Disc spaces: Degenerative disease with disc height loss at L3-4, L4-5 and L5-S1.   T12-L1: Minimal broad-based disc bulge. No foraminal or central canal stenosis.   L1-L2: No significant disc bulge. No neural foraminal stenosis. No central canal stenosis.   L2-L3: Minimal broad-based disc bulge. Mild bilateral facet arthropathy. No foraminal or central canal stenosis.   L3-L4: Broad-based disc bulge with a broad left paracentral disc protrusion with mass effect on the left intraspinal L4 nerve root. Moderate bilateral facet arthropathy. Moderate-severe spinal stenosis. Left subarticular recess stenosis. Moderate left foraminal stenosis. Mild right foraminal stenosis.   L4-L5: Broad-based disc bulge with a broad central disc protrusion. Severe bilateral facet arthropathy with ligamentum flavum infolding. Severe spinal stenosis. Mild right and severe left foraminal stenosis.   L5-S1: Mild broad-based disc bulge with a broad shallow right paracentral disc protrusion with mass effect on the right intraspinal S1 nerve root. Mild bilateral foraminal stenosis. Mild bilateral facet arthropathy. No spinal stenosis.   IMPRESSION: 1. Lumbar spine spondylosis as described above most severe at L3-4, L4-5 and L5-S1. 2. No acute osseous injury of the lumbar spine.     Electronically Signed   By: Onnie Bilis M.D.   On: 11/23/2020 08:11  PMFS History: Patient Active Problem List   Diagnosis Date Noted   Elevated LFTs 04/04/2023   Anemia 04/04/2023   Choledocholithiasis 04/03/2023   Acute biliary pancreatitis 08/07/2022   Sepsis (HCC) 08/07/2022   Sacral wound 05/14/2022   Coronary artery disease involving native coronary artery of native heart without angina pectoris 02/21/2022   Discitis of lumbar region 02/05/2022   Herpes zoster without complication 02/05/2022   Clostridium difficile colitis 02/05/2022   Severe sepsis with acute organ dysfunction due to Gram positive  bacteria (HCC) 02/02/2022   Pacemaker infection (HCC) 02/01/2022   Periostitis of lumbar spine without osteomyelitis (HCC) 02/01/2022   Acute midline low back pain without sciatica 01/26/2022   MSSA bacteremia 01/25/2022   Presence of cardiac pacemaker 01/25/2022   Severe sepsis with septic shock (HCC) 01/24/2022   AKI (acute kidney injury) (HCC) 01/24/2022   Sinus pause 01/04/2022   Acute viral bronchitis 02/06/2021   Acute blood loss anemia    Postoperative pain    S/P transmetatarsal amputation of foot, right (HCC) 01/24/2021   Protein-calorie malnutrition, severe 01/17/2021   Diabetic foot infection (HCC) 01/16/2021   Rheumatoid arthritis (HCC) 01/16/2021   Stage 3b chronic kidney disease (CKD) (HCC) 01/16/2021   DNR (do not resuscitate) 01/16/2021   Decreased anal sphincter tone 12/13/2018   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   Irritable bowel  syndrome with diarrhea - post infectious 12/12/2018   Full incontinence of feces and fecal smearing 12/12/2018   Perianal dermatitis 12/12/2018   Uncontrolled type 2 diabetes mellitus with hyperglycemia (HCC) 10/26/2018   Long-term use of immunosuppressant medication-Enbrel 10/26/2018   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 08/27/2018   Thrombocytopenia (HCC)    Essential hypertension    Dyspnea on exertion    Steroid-induced hyperglycemia    Fatigue 08/06/2018   Paroxysmal atrial fibrillation (HCC)    Mixed hyperlipidemia 09/29/2016   Stable angina (HCC) 09/29/2016   PAD (peripheral artery disease) (HCC) 08/09/2016   Preoperative clearance 05/21/2012   Precordial chest pain    Palpitations    Diabetes mellitus (HCC)    GERD (gastroesophageal reflux disease)    Ejection fraction    Carotid artery disease (HCC)    Mitral regurgitation 04/21/2009   PSORIASIS 02/09/2009   Rheumatoid arthritis flare (HCC) 02/09/2009   Past Medical History:  Diagnosis Date   Anemia    Bruit    Carotid Doppler showed no significant  abnormality     9per patient)   C. difficile colitis 07/2018   with severe sepsis   Chest pain, unspecified    Nuclear, May, 2008, no scar or ischemia   Chronic kidney disease    Diabetes mellitus    Diverticulosis    Dyslipidemia    Dysrhythmia    a-fib   Ejection fraction    EF 55-60%, echo, February, 2011 // Echocardiogram 8/21: EF 60-65, no RWMA, Gr 1 DD, GLS -14%, normal RVSF, mild LAE, trivial MR, mild MS (mean 4 mmHg), RVSP 23.4    GERD (gastroesophageal reflux disease)    History of kidney stones    removed   HLD (hyperlipidemia)    Implantable loop recorder present    Memory loss    Mitral regurgitation 04/21/2009   mild,  echo, February, 2011   Osteoporosis    Palpitations    possible very brief atrial fibrillation on monitor and possible reentrant tachycardia   Pneumonia    PONV (postoperative nausea and vomiting)    Psoriasis    Rheumatoid arthritis (HCC)     Family History  Problem Relation Age of Onset   Heart failure Father    Heart attack Father    Diabetes Father    Heart attack Mother    Heart failure Mother    Diabetes Mother    Stroke Sister     Past Surgical History:  Procedure Laterality Date   ABDOMINAL AORTOGRAM W/LOWER EXTREMITY Right 10/19/2020   Procedure: ABDOMINAL AORTOGRAM W/LOWER EXTREMITY;  Surgeon: Wenona Hamilton, MD;  Location: [facility];  Service: Cardiovascular;  Laterality: Right;   CHOLECYSTECTOMY N/A 08/09/2022   Procedure: LAPAROSCOPIC CHOLECYSTECTOMY WITH INTRAOPERATIVE CHOLANGIOGRAM;  Surgeon: Junie Olds, MD;  Location: [facility];  Service: General;  Laterality: N/A;   ENDOSCOPIC RETROGRADE CHOLANGIOPANCREATOGRAPHY (ERCP) WITH PROPOFOL  N/A 04/05/2023   Procedure: ENDOSCOPIC RETROGRADE CHOLANGIOPANCREATOGRAPHY (ERCP) WITH PROPOFOL ;  Surgeon: Tobin Forts, MD;  Location: [facility];  Service: Gastroenterology;  Laterality: N/A;   FRACTURE SURGERY     KYPHOPLASTY N/A 04/25/2023   Procedure: LUMBAR 1  KYPHOPLASTY;  Surgeon: Virl Grimes, MD;  Location: [facility];  Service: Orthopedics;  Laterality: N/A;   LOOP Recorder placement     LOOP RECORDER REMOVAL N/A 01/05/2022   Procedure: LOOP RECORDER REMOVAL;  Surgeon: Tammie Fall, MD;  Location: [facility];  Service: Cardiovascular;  Laterality: N/A;   PACEMAKER IMPLANT N/A  01/05/2022   Procedure: [employer] PACEMAKER IMPLANT;  Surgeon: Tammie Fall, MD;  [facility]: [facility];  Service: Cardiovascular;  Laterality: N/A;   PPM GENERATOR REMOVAL N/A 01/29/2022   Procedure: PPM GENERATOR REMOVAL;  Surgeon: Tammie Fall, MD;  Location: [facility];  Service: Cardiovascular;  Laterality: N/A;   REMOVAL OF STONES  04/05/2023   Procedure: REMOVAL OF STONES;  Surgeon: Tobin Forts, MD;  Location: [facility];  Service: Gastroenterology;;   [address]  04/05/2023   Procedure: [address];  Surgeon: Tobin Forts, MD;  Location: [facility];  Service: Gastroenterology;;   TEE WITHOUT CARDIOVERSION N/A 01/29/2022   Procedure: TRANSESOPHAGEAL ECHOCARDIOGRAM (TEE);  Surgeon: Harrold Lincoln, MD;  Location: [facility];  Service: Cardiovascular;  Laterality: N/A;   TRANSMETATARSAL AMPUTATION Right 01/18/2021   Procedure: TRANSMETATARSAL AMPUTATION;  Surgeon: Amada Backer, MD;  Location: [facility];  Service: Orthopedics;  Laterality: Right;   Social History   Occupational History    Employer: RETIRED  Tobacco Use   Smoking status: Never   Smokeless tobacco: Never  Vaping Use   Vaping status: Never Used  Substance and Sexual Activity   Alcohol  use: No    Alcohol /week: 0.0 standard drinks of alcohol    Drug use: No   Sexual activity: Yes    Birth control/protection: Post-menopausal

## 2023-08-21 DIAGNOSIS — I130 Hypertensive heart and chronic kidney disease with heart failure and stage 1 through stage 4 chronic kidney disease, or unspecified chronic kidney disease: Secondary | ICD-10-CM | POA: Diagnosis not present

## 2023-08-21 DIAGNOSIS — E1122 Type 2 diabetes mellitus with diabetic chronic kidney disease: Secondary | ICD-10-CM | POA: Diagnosis not present

## 2023-08-21 DIAGNOSIS — I509 Heart failure, unspecified: Secondary | ICD-10-CM | POA: Diagnosis not present

## 2023-08-21 DIAGNOSIS — N1832 Chronic kidney disease, stage 3b: Secondary | ICD-10-CM | POA: Diagnosis not present

## 2023-08-21 DIAGNOSIS — L89622 Pressure ulcer of left heel, stage 2: Secondary | ICD-10-CM | POA: Diagnosis not present

## 2023-08-21 DIAGNOSIS — L89892 Pressure ulcer of other site, stage 2: Secondary | ICD-10-CM | POA: Diagnosis not present

## 2023-08-22 ENCOUNTER — Ambulatory Visit (INDEPENDENT_AMBULATORY_CARE_PROVIDER_SITE_OTHER)

## 2023-08-22 DIAGNOSIS — E1142 Type 2 diabetes mellitus with diabetic polyneuropathy: Secondary | ICD-10-CM | POA: Diagnosis not present

## 2023-08-22 DIAGNOSIS — D631 Anemia in chronic kidney disease: Secondary | ICD-10-CM | POA: Diagnosis not present

## 2023-08-22 DIAGNOSIS — L89622 Pressure ulcer of left heel, stage 2: Secondary | ICD-10-CM | POA: Diagnosis not present

## 2023-08-22 DIAGNOSIS — N1832 Chronic kidney disease, stage 3b: Secondary | ICD-10-CM | POA: Diagnosis not present

## 2023-08-22 DIAGNOSIS — Z794 Long term (current) use of insulin: Secondary | ICD-10-CM | POA: Diagnosis not present

## 2023-08-22 DIAGNOSIS — E1151 Type 2 diabetes mellitus with diabetic peripheral angiopathy without gangrene: Secondary | ICD-10-CM | POA: Diagnosis not present

## 2023-08-22 DIAGNOSIS — E1122 Type 2 diabetes mellitus with diabetic chronic kidney disease: Secondary | ICD-10-CM | POA: Diagnosis not present

## 2023-08-22 DIAGNOSIS — I509 Heart failure, unspecified: Secondary | ICD-10-CM | POA: Diagnosis not present

## 2023-08-22 DIAGNOSIS — I130 Hypertensive heart and chronic kidney disease with heart failure and stage 1 through stage 4 chronic kidney disease, or unspecified chronic kidney disease: Secondary | ICD-10-CM | POA: Diagnosis not present

## 2023-08-22 DIAGNOSIS — I480 Paroxysmal atrial fibrillation: Secondary | ICD-10-CM | POA: Diagnosis not present

## 2023-08-22 DIAGNOSIS — L89892 Pressure ulcer of other site, stage 2: Secondary | ICD-10-CM | POA: Diagnosis not present

## 2023-08-22 DIAGNOSIS — M810 Age-related osteoporosis without current pathological fracture: Secondary | ICD-10-CM | POA: Diagnosis not present

## 2023-08-22 LAB — CUP PACEART REMOTE DEVICE CHECK
Date Time Interrogation Session: 20250618232849
Implantable Pulse Generator Implant Date: 20240708

## 2023-08-23 ENCOUNTER — Ambulatory Visit: Payer: Self-pay | Admitting: Cardiovascular Disease

## 2023-08-23 DIAGNOSIS — M1712 Unilateral primary osteoarthritis, left knee: Secondary | ICD-10-CM | POA: Diagnosis not present

## 2023-08-26 DIAGNOSIS — E1140 Type 2 diabetes mellitus with diabetic neuropathy, unspecified: Secondary | ICD-10-CM | POA: Diagnosis not present

## 2023-08-26 DIAGNOSIS — E785 Hyperlipidemia, unspecified: Secondary | ICD-10-CM | POA: Diagnosis not present

## 2023-08-26 DIAGNOSIS — D649 Anemia, unspecified: Secondary | ICD-10-CM | POA: Diagnosis not present

## 2023-08-26 DIAGNOSIS — I480 Paroxysmal atrial fibrillation: Secondary | ICD-10-CM | POA: Diagnosis not present

## 2023-08-26 DIAGNOSIS — E559 Vitamin D deficiency, unspecified: Secondary | ICD-10-CM | POA: Diagnosis not present

## 2023-08-28 DIAGNOSIS — E1122 Type 2 diabetes mellitus with diabetic chronic kidney disease: Secondary | ICD-10-CM | POA: Diagnosis not present

## 2023-08-28 DIAGNOSIS — L89622 Pressure ulcer of left heel, stage 2: Secondary | ICD-10-CM | POA: Diagnosis not present

## 2023-08-28 DIAGNOSIS — I509 Heart failure, unspecified: Secondary | ICD-10-CM | POA: Diagnosis not present

## 2023-08-28 DIAGNOSIS — L89892 Pressure ulcer of other site, stage 2: Secondary | ICD-10-CM | POA: Diagnosis not present

## 2023-08-28 DIAGNOSIS — N1832 Chronic kidney disease, stage 3b: Secondary | ICD-10-CM | POA: Diagnosis not present

## 2023-08-28 DIAGNOSIS — I130 Hypertensive heart and chronic kidney disease with heart failure and stage 1 through stage 4 chronic kidney disease, or unspecified chronic kidney disease: Secondary | ICD-10-CM | POA: Diagnosis not present

## 2023-08-29 ENCOUNTER — Other Ambulatory Visit (HOSPITAL_COMMUNITY): Payer: Self-pay | Admitting: *Deleted

## 2023-09-02 ENCOUNTER — Telehealth: Payer: Self-pay

## 2023-09-02 ENCOUNTER — Ambulatory Visit (HOSPITAL_COMMUNITY)
Admission: RE | Admit: 2023-09-02 | Discharge: 2023-09-02 | Disposition: A | Discharge status: Home / Self Care | Source: Ambulatory Visit | Attending: Endocrinology | Admitting: Endocrinology

## 2023-09-02 DIAGNOSIS — I480 Paroxysmal atrial fibrillation: Secondary | ICD-10-CM | POA: Diagnosis not present

## 2023-09-02 DIAGNOSIS — E785 Hyperlipidemia, unspecified: Secondary | ICD-10-CM | POA: Diagnosis not present

## 2023-09-02 DIAGNOSIS — I739 Peripheral vascular disease, unspecified: Secondary | ICD-10-CM | POA: Diagnosis not present

## 2023-09-02 DIAGNOSIS — Z89431 Acquired absence of right foot: Secondary | ICD-10-CM | POA: Diagnosis not present

## 2023-09-02 DIAGNOSIS — N1832 Chronic kidney disease, stage 3b: Secondary | ICD-10-CM | POA: Diagnosis not present

## 2023-09-02 DIAGNOSIS — M810 Age-related osteoporosis without current pathological fracture: Secondary | ICD-10-CM | POA: Insufficient documentation

## 2023-09-02 DIAGNOSIS — M069 Rheumatoid arthritis, unspecified: Secondary | ICD-10-CM | POA: Diagnosis not present

## 2023-09-02 DIAGNOSIS — I129 Hypertensive chronic kidney disease with stage 1 through stage 4 chronic kidney disease, or unspecified chronic kidney disease: Secondary | ICD-10-CM | POA: Diagnosis not present

## 2023-09-02 DIAGNOSIS — Z794 Long term (current) use of insulin: Secondary | ICD-10-CM | POA: Diagnosis not present

## 2023-09-02 DIAGNOSIS — E1140 Type 2 diabetes mellitus with diabetic neuropathy, unspecified: Secondary | ICD-10-CM | POA: Diagnosis not present

## 2023-09-02 DIAGNOSIS — S81802D Unspecified open wound, left lower leg, subsequent encounter: Secondary | ICD-10-CM | POA: Diagnosis not present

## 2023-09-02 DIAGNOSIS — S32010G Wedge compression fracture of first lumbar vertebra, subsequent encounter for fracture with delayed healing: Secondary | ICD-10-CM | POA: Diagnosis not present

## 2023-09-02 DIAGNOSIS — D649 Anemia, unspecified: Secondary | ICD-10-CM | POA: Diagnosis not present

## 2023-09-02 MED ORDER — DENOSUMAB 60 MG/ML ~~LOC~~ SOSY
60.0000 mg | PREFILLED_SYRINGE | Freq: Once | SUBCUTANEOUS | Status: AC
  Administered 2023-09-02: 60 mg via SUBCUTANEOUS

## 2023-09-02 MED ORDER — DENOSUMAB 60 MG/ML ~~LOC~~ SOSY
PREFILLED_SYRINGE | SUBCUTANEOUS | Status: AC
  Filled 2023-09-02: qty 1

## 2023-09-02 NOTE — Telephone Encounter (Signed)
 Patient's daughter called stating the patient had a recent DEXA scan that showed severe osteoporosis and was recommended to start Prolia. She reports patient received first Prolia injection already.  Patient's daughter would like to know your thoughts about proceeding with the medication.  I have her scheduled with you to discuss, she hasn't seen you in a year. Also let me know if appointment is not needed or can be virtual. Opal Dinning ONEIDA Ligas, CMA

## 2023-09-03 NOTE — Telephone Encounter (Signed)
 Pt advised and will keep follow up appointment to possibly discuss alternatives

## 2023-09-03 NOTE — Telephone Encounter (Signed)
 Patient daughter states in the past you advised them that the patient should not be treated with biologics given the severity of her recent infectious diseases including her MSSA bacteremia with pacemaker infection. She wants to make sure prolia is ok and was advised it is considered a biologic medication.

## 2023-09-04 DIAGNOSIS — L89622 Pressure ulcer of left heel, stage 2: Secondary | ICD-10-CM | POA: Diagnosis not present

## 2023-09-04 DIAGNOSIS — E1122 Type 2 diabetes mellitus with diabetic chronic kidney disease: Secondary | ICD-10-CM | POA: Diagnosis not present

## 2023-09-04 DIAGNOSIS — L89892 Pressure ulcer of other site, stage 2: Secondary | ICD-10-CM | POA: Diagnosis not present

## 2023-09-04 DIAGNOSIS — I509 Heart failure, unspecified: Secondary | ICD-10-CM | POA: Diagnosis not present

## 2023-09-04 DIAGNOSIS — N1832 Chronic kidney disease, stage 3b: Secondary | ICD-10-CM | POA: Diagnosis not present

## 2023-09-04 DIAGNOSIS — I130 Hypertensive heart and chronic kidney disease with heart failure and stage 1 through stage 4 chronic kidney disease, or unspecified chronic kidney disease: Secondary | ICD-10-CM | POA: Diagnosis not present

## 2023-09-05 ENCOUNTER — Telehealth: Payer: Self-pay | Admitting: Cardiovascular Disease

## 2023-09-05 ENCOUNTER — Other Ambulatory Visit: Payer: Self-pay

## 2023-09-05 MED ORDER — NITROGLYCERIN 0.4 MG SL SUBL: SUBLINGUAL_TABLET | SUBLINGUAL | 0 refills | Status: DC

## 2023-09-05 NOTE — Telephone Encounter (Signed)
*  STAT* If patient is at the pharmacy, call can be transferred to refill team.   1. Which medications need to be refilled? (please list name of each medication and dose if known)   nitroGLYCERIN  (NITROSTAT ) 0.4 MG SL tablet   2. Would you like to learn more about the convenience, safety, & potential cost savings by using the [facility]?   3. Are you open to using the [facility] (Type [facility]. ).  4. Which pharmacy/location (including street and city if local pharmacy) is medication to be sent to?  [facility] - [place], [place] - [address]   5. Do they need a 30 day or 90 day supply?   Daughter Giles) stated patient's medication has expired.

## 2023-09-05 NOTE — Telephone Encounter (Signed)
 Pt's medication was sent to pt's pharmacy as requested. Confirmation received.

## 2023-09-09 DIAGNOSIS — L409 Psoriasis, unspecified: Secondary | ICD-10-CM | POA: Diagnosis not present

## 2023-09-09 DIAGNOSIS — L89892 Pressure ulcer of other site, stage 2: Secondary | ICD-10-CM | POA: Diagnosis not present

## 2023-09-09 DIAGNOSIS — D631 Anemia in chronic kidney disease: Secondary | ICD-10-CM | POA: Diagnosis not present

## 2023-09-09 DIAGNOSIS — H9193 Unspecified hearing loss, bilateral: Secondary | ICD-10-CM | POA: Diagnosis not present

## 2023-09-09 DIAGNOSIS — N1832 Chronic kidney disease, stage 3b: Secondary | ICD-10-CM | POA: Diagnosis not present

## 2023-09-09 DIAGNOSIS — I480 Paroxysmal atrial fibrillation: Secondary | ICD-10-CM | POA: Diagnosis not present

## 2023-09-09 DIAGNOSIS — I130 Hypertensive heart and chronic kidney disease with heart failure and stage 1 through stage 4 chronic kidney disease, or unspecified chronic kidney disease: Secondary | ICD-10-CM | POA: Diagnosis not present

## 2023-09-09 DIAGNOSIS — E1142 Type 2 diabetes mellitus with diabetic polyneuropathy: Secondary | ICD-10-CM | POA: Diagnosis not present

## 2023-09-09 DIAGNOSIS — I509 Heart failure, unspecified: Secondary | ICD-10-CM | POA: Diagnosis not present

## 2023-09-09 DIAGNOSIS — K219 Gastro-esophageal reflux disease without esophagitis: Secondary | ICD-10-CM | POA: Diagnosis not present

## 2023-09-09 DIAGNOSIS — M170 Bilateral primary osteoarthritis of knee: Secondary | ICD-10-CM | POA: Diagnosis not present

## 2023-09-09 DIAGNOSIS — M069 Rheumatoid arthritis, unspecified: Secondary | ICD-10-CM | POA: Diagnosis not present

## 2023-09-09 DIAGNOSIS — I052 Rheumatic mitral stenosis with insufficiency: Secondary | ICD-10-CM | POA: Diagnosis not present

## 2023-09-09 DIAGNOSIS — I2510 Atherosclerotic heart disease of native coronary artery without angina pectoris: Secondary | ICD-10-CM | POA: Diagnosis not present

## 2023-09-09 DIAGNOSIS — E1122 Type 2 diabetes mellitus with diabetic chronic kidney disease: Secondary | ICD-10-CM | POA: Diagnosis not present

## 2023-09-09 DIAGNOSIS — I890 Lymphedema, not elsewhere classified: Secondary | ICD-10-CM | POA: Diagnosis not present

## 2023-09-09 DIAGNOSIS — L89622 Pressure ulcer of left heel, stage 2: Secondary | ICD-10-CM | POA: Diagnosis not present

## 2023-09-09 DIAGNOSIS — I700 Atherosclerosis of aorta: Secondary | ICD-10-CM | POA: Diagnosis not present

## 2023-09-09 DIAGNOSIS — M47816 Spondylosis without myelopathy or radiculopathy, lumbar region: Secondary | ICD-10-CM | POA: Diagnosis not present

## 2023-09-09 DIAGNOSIS — E1151 Type 2 diabetes mellitus with diabetic peripheral angiopathy without gangrene: Secondary | ICD-10-CM | POA: Diagnosis not present

## 2023-09-09 DIAGNOSIS — M810 Age-related osteoporosis without current pathological fracture: Secondary | ICD-10-CM | POA: Diagnosis not present

## 2023-09-09 DIAGNOSIS — E785 Hyperlipidemia, unspecified: Secondary | ICD-10-CM | POA: Diagnosis not present

## 2023-09-09 DIAGNOSIS — K5730 Diverticulosis of large intestine without perforation or abscess without bleeding: Secondary | ICD-10-CM | POA: Diagnosis not present

## 2023-09-09 DIAGNOSIS — N281 Cyst of kidney, acquired: Secondary | ICD-10-CM | POA: Diagnosis not present

## 2023-09-09 DIAGNOSIS — I872 Venous insufficiency (chronic) (peripheral): Secondary | ICD-10-CM | POA: Diagnosis not present

## 2023-09-09 NOTE — Progress Notes (Signed)
 [facility] Summary Report / Loop Recorder

## 2023-09-11 DIAGNOSIS — I509 Heart failure, unspecified: Secondary | ICD-10-CM | POA: Diagnosis not present

## 2023-09-11 DIAGNOSIS — L89622 Pressure ulcer of left heel, stage 2: Secondary | ICD-10-CM | POA: Diagnosis not present

## 2023-09-11 DIAGNOSIS — N1832 Chronic kidney disease, stage 3b: Secondary | ICD-10-CM | POA: Diagnosis not present

## 2023-09-11 DIAGNOSIS — I130 Hypertensive heart and chronic kidney disease with heart failure and stage 1 through stage 4 chronic kidney disease, or unspecified chronic kidney disease: Secondary | ICD-10-CM | POA: Diagnosis not present

## 2023-09-11 DIAGNOSIS — L89892 Pressure ulcer of other site, stage 2: Secondary | ICD-10-CM | POA: Diagnosis not present

## 2023-09-11 DIAGNOSIS — E1122 Type 2 diabetes mellitus with diabetic chronic kidney disease: Secondary | ICD-10-CM | POA: Diagnosis not present

## 2023-09-16 DIAGNOSIS — M5416 Radiculopathy, lumbar region: Secondary | ICD-10-CM | POA: Diagnosis not present

## 2023-09-17 DIAGNOSIS — E1122 Type 2 diabetes mellitus with diabetic chronic kidney disease: Secondary | ICD-10-CM | POA: Diagnosis not present

## 2023-09-17 DIAGNOSIS — I509 Heart failure, unspecified: Secondary | ICD-10-CM | POA: Diagnosis not present

## 2023-09-17 DIAGNOSIS — L89892 Pressure ulcer of other site, stage 2: Secondary | ICD-10-CM | POA: Diagnosis not present

## 2023-09-17 DIAGNOSIS — N1832 Chronic kidney disease, stage 3b: Secondary | ICD-10-CM | POA: Diagnosis not present

## 2023-09-17 DIAGNOSIS — L89622 Pressure ulcer of left heel, stage 2: Secondary | ICD-10-CM | POA: Diagnosis not present

## 2023-09-17 DIAGNOSIS — I130 Hypertensive heart and chronic kidney disease with heart failure and stage 1 through stage 4 chronic kidney disease, or unspecified chronic kidney disease: Secondary | ICD-10-CM | POA: Diagnosis not present

## 2023-09-17 NOTE — Progress Notes (Unsigned)
 Chief complaint: followup for pacemaker infection DFI Subjective:    Patient ID: Shelby Walker, female    DOB: 10/04/1933, 89 y.o.   MRN: [id number]  HPI  Discussed the use of AI scribe software for clinical note transcription with the patient, who gave verbal consent to proceed.  History of Present Illness   Shelby Walker is an 89 year old female with a history of rheumatoid arthritis, diabetes mellitus, and prior pacemaker infection who presents for follow-up of her foot ulcers and infection history.  She has a history of methicillin-sensitive Staphylococcus aureus bacteremia, sepsis, and septic shock due to a pacemaker infection, which necessitated pacemaker removal. She completed a six-week course of cefazolin . Recent imaging of the lumbar spine and left psoas muscle was performed.  She has diabetic foot infections and osteomyelitis, with a past right-side amputation. Recently, a sore on her left foot, initially a blister, was treated with doxycycline  for ten days. Follow-up MRI showed soft tissue swelling without abscesses or osteomyelitis. Her sed rate is 31, and CRP is 45.8. She completed cefadroxil  500 mg twice daily. Her foot ulcers are much improved, and she uses nitroglycerin  patches for circulation. The ulcers are significantly better and no longer require covering.  She has severe osteoporosis, confirmed by a recent bone density scan, and received a Prolia  injection two weeks ago. She previously took Fosamax but discontinued it due to other medications.  For rheumatoid arthritis, she is on methotrexate injections weekly as well as intermittent prdidsone. She has used various biologics, including Humira, but is not currently on them due to past infections. She experiences nerve pain, managed with injections from an orthopedic specialist.   She did have ONE biologic Prolia  given a few weeks ago for her severe osteoporosis.      Past Medical History:  Diagnosis Date   Anemia    Bruit     Carotid Doppler showed no significant abnormality     9per patient)   C. difficile colitis 07/2018   with severe sepsis   Chest pain, unspecified    Nuclear, May, 2008, no scar or ischemia   Chronic kidney disease    Diabetes mellitus    Diverticulosis    Dyslipidemia    Dysrhythmia    a-fib   Ejection fraction    EF 55-60%, echo, February, 2011 // Echocardiogram 8/21: EF 60-65, no RWMA, Gr 1 DD, GLS -14%, normal RVSF, mild LAE, trivial MR, mild MS (mean 4 mmHg), RVSP 23.4    GERD (gastroesophageal reflux disease)    History of kidney stones    removed   HLD (hyperlipidemia)    Implantable loop recorder present    Memory loss    Mitral regurgitation 04/21/2009   mild,  echo, February, 2011   Osteoporosis    Palpitations    possible very brief atrial fibrillation on monitor and possible reentrant tachycardia   Pneumonia    PONV (postoperative nausea and vomiting)    Psoriasis    Rheumatoid arthritis (HCC)     Past Surgical History:  Procedure Laterality Date   ABDOMINAL AORTOGRAM W/LOWER EXTREMITY Right 10/19/2020   Procedure: ABDOMINAL AORTOGRAM W/LOWER EXTREMITY;  Surgeon: Darron Deatrice LABOR, MD;  Location: [facility];  Service: Cardiovascular;  Laterality: Right;   CHOLECYSTECTOMY N/A 08/09/2022   Procedure: LAPAROSCOPIC CHOLECYSTECTOMY WITH INTRAOPERATIVE CHOLANGIOGRAM;  Surgeon: Lyndel Deward PARAS, MD;  Location: [facility];  Service: General;  Laterality: N/A;   ENDOSCOPIC RETROGRADE CHOLANGIOPANCREATOGRAPHY (ERCP) WITH PROPOFOL  N/A 04/05/2023   Procedure:  ENDOSCOPIC RETROGRADE CHOLANGIOPANCREATOGRAPHY (ERCP) WITH PROPOFOL ;  Surgeon: Abran Norleen SAILOR, MD;  Location: [facility];  Service: Gastroenterology;  Laterality: N/A;   FRACTURE SURGERY     KYPHOPLASTY N/A 04/25/2023   Procedure: LUMBAR 1 KYPHOPLASTY;  Surgeon: Beuford Anes, MD;  Location: [facility];  Service: Orthopedics;  Laterality: N/A;   LOOP Recorder placement     LOOP RECORDER REMOVAL N/A 01/05/2022    Procedure: LOOP RECORDER REMOVAL;  Surgeon: Waddell Danelle ORN, MD;  Location: [facility];  Service: Cardiovascular;  Laterality: N/A;   PACEMAKER IMPLANT N/A 01/05/2022   Procedure: [employer] PACEMAKER IMPLANT;  Surgeon: Waddell Danelle ORN, MD;  Location: [facility];  Service: Cardiovascular;  Laterality: N/A;   PPM GENERATOR REMOVAL N/A 01/29/2022   Procedure: PPM GENERATOR REMOVAL;  Surgeon: Waddell Danelle ORN, MD;  Location: [facility];  Service: Cardiovascular;  Laterality: N/A;   REMOVAL OF STONES  04/05/2023   Procedure: REMOVAL OF STONES;  Surgeon: Abran Norleen SAILOR, MD;  Location: [facility];  Service: Gastroenterology;;   ANNETT  04/05/2023   Procedure: ANNETT;  Surgeon: Abran Norleen SAILOR, MD;  Location: [facility];  Service: Gastroenterology;;   TEE WITHOUT CARDIOVERSION N/A 01/29/2022   Procedure: TRANSESOPHAGEAL ECHOCARDIOGRAM (TEE);  Surgeon: Barbaraann Darryle Ned, MD;  Location: [facility];  Service: Cardiovascular;  Laterality: N/A;   TRANSMETATARSAL AMPUTATION Right 01/18/2021   Procedure: TRANSMETATARSAL AMPUTATION;  Surgeon: Kit Norleen, MD;  Location: [facility] OR;  Service: Orthopedics;  Laterality: Right;    Family History  Problem Relation Age of Onset   Heart failure Father    Heart attack Father    Diabetes Father    Heart attack Mother    Heart failure Mother    Diabetes Mother    Stroke Sister       Social History   Socioeconomic History   Marital status: Widowed    Spouse name: Zachary   Number of children: 2   Years of education: 13   Highest education level: Associate degree: academic program  Occupational History    Employer: RETIRED  Tobacco Use   Smoking status: Never   Smokeless tobacco: Never  Vaping Use   Vaping status: Never Used  Substance and Sexual Activity   Alcohol  use: No    Alcohol /week: 0.0 standard drinks of alcohol    Drug use: No   Sexual activity: Yes    Birth control/protection: Post-menopausal   Other Topics Concern   Not on file  Social History Narrative   She is married to [place] and lives with him 2 children   Retired   No alcohol  or drugs and never smoker   Social Drivers of Corporate investment banker Strain: Low Risk  (07/24/2018)   Overall Financial Resource Strain (CARDIA)    Difficulty of Paying Living Expenses: Not hard at all  Food Insecurity: No Food Insecurity (04/04/2023)   Hunger Vital Sign    Worried About Running Out of Food in the Last Year: Never true    Ran Out of Food in the Last Year: Never true  Transportation Needs: No Transportation Needs (04/04/2023)   PRAPARE - Administrator, Civil Service (Medical): No    Lack of Transportation (Non-Medical): No  Physical Activity: Sufficiently Active (07/24/2018)   Exercise Vital Sign    Days of Exercise per Week: 5 days    Minutes of Exercise per Session: 30 min  Stress: No Stress Concern Present (07/24/2018)   [employer] of Occupational  Health - Occupational Stress Questionnaire    Feeling of Stress : Not at all  Social Connections: Socially Integrated (04/04/2023)   Social Connection and Isolation Panel    Frequency of Communication with Friends and Family: More than three times a week    Frequency of Social Gatherings with Friends and Family: More than three times a week    Attends Religious Services: More than 4 times per year    Active Member of [employer] or Organizations: Yes    Attends Engineer, structural: More than 4 times per year    Marital Status: Married    Allergies  Allergen Reactions   Cholestyramine      Possible- Makes throat burn. Patient said she can't take it    Compazine [Prochlorperazine Edisylate] Swelling    REACTION:  tongue swell and unable to swallow   Celecoxib Diarrhea   Sulfonamide Derivatives Other (See Comments)    REACTION:  broke out with fine itching bumps   Hydrocodone  Other (See Comments)   Infliximab Other (See Comments)    Other  reaction(s): rash   Leflunomide Other (See Comments)    Other reaction(s): diarrhea   Prochlorperazine Other (See Comments)    Other reaction(s): Unknown  Other Reaction(s): tongue swells   Rosuvastatin     Other reaction(s): muscle aches   Sulfa Antibiotics Other (See Comments)    Other reaction(s): tongue swelling     Current Outpatient Medications:    acetaminophen  (TYLENOL ) 325 MG tablet, Take 650 mg by mouth every 6 (six) hours as needed for moderate pain (pain score 4-6)., Disp: , Rfl:    amiodarone  (PACERONE ) 200 MG tablet, Take 0.5 tablets (100 mg total) by mouth daily., Disp: 45 tablet, Rfl: 2   atorvastatin  (LIPITOR) 20 MG tablet, Take 1 tablet by mouth once daily, Disp: 90 tablet, Rfl: 3   Calcium  Carbonate-Vitamin D 600-400 MG-UNIT tablet, Take 1 tablet by mouth daily at 12 noon. , Disp: , Rfl:    denosumab  (PROLIA ) 60 MG/ML SOSY injection, Inject 60 mg into the skin every 6 (six) months., Disp: , Rfl:    diclofenac  Sodium (VOLTAREN ) 1 % GEL, Apply 4 g topically 4 (four) times daily. (Patient taking differently: Apply 4 g topically 4 (four) times daily as needed.), Disp: , Rfl:    esomeprazole  (NEXIUM ) 20 MG capsule, Take 20 mg by mouth daily at 12 noon., Disp: , Rfl:    folic acid  (FOLVITE ) 1 MG tablet, Take 1 mg by mouth daily., Disp: , Rfl:    insulin  aspart (NOVOLOG ) 100 UNIT/ML FlexPen, Inject 2 Units into the skin 3 (three) times daily with meals. Sliding scale (Patient taking differently: Inject 5-8 Units into the skin 3 (three) times daily with meals. Sliding scale), Disp: 15 mL, Rfl: 11   Insulin  Degludec (TRESIBA ) 100 UNIT/ML SOLN, Inject 4 Units into the skin at bedtime. (Patient taking differently: Inject 6 Units into the skin at bedtime.), Disp: , Rfl:    iron polysaccharides (NIFEREX) 150 MG capsule, Take 150 mg by mouth daily., Disp: , Rfl:    lidocaine  (LIDODERM ) 5 %, Place 1 patch onto the skin daily. Remove & Discard patch within 12 hours or as directed by MD,  Disp: 14 patch, Rfl: 0   methocarbamol  (ROBAXIN ) 500 MG tablet, Take 1-2 tablets (500-1,000 mg total) by mouth every 6 (six) hours as needed for muscle spasms., Disp: 60 tablet, Rfl: 0   methotrexate 50 MG/2ML injection, Inject 0.4 mLs into the muscle once a week., Disp: ,  Rfl:    mirtazapine  (REMERON ) 7.5 MG tablet, Take 7.5 mg by mouth at bedtime., Disp: , Rfl:    Multiple Vitamins-Minerals (CENTRUM SILVER  50+WOMEN PO), Take 1 tablet by mouth daily., Disp: , Rfl:    nitroGLYCERIN  (NITRODUR - DOSED IN MG/24 HR) 0.2 mg/hr patch, Place 1 patch (0.2 mg total) onto the skin daily., Disp: 30 patch, Rfl: 12   nitroGLYCERIN  (NITROSTAT ) 0.4 MG SL tablet, Dissolve 1 tablet under the tongue every 5 minutes as needed for chest pain. Max of 3 doses, then 911., Disp: 25 tablet, Rfl: 0   predniSONE  (DELTASONE ) 5 MG tablet, Take 1 tablet (5 mg total) by mouth daily with breakfast., Disp: 30 tablet, Rfl: 0   pregabalin (LYRICA) 50 MG capsule, Take 50 mg by mouth in the morning., Disp: , Rfl:    pregabalin (LYRICA) 75 MG capsule, Take 75 mg by mouth at bedtime., Disp: , Rfl:    protein supplement shake (PREMIER PROTEIN) LIQD, Take 2 oz by mouth 2 (two) times daily., Disp: , Rfl:    pyrithione zinc  (SELSUN BLUE DRY SCALP) 1 % shampoo, Apply 1 application topically once a week., Disp: , Rfl:    RESTASIS  0.05 % ophthalmic emulsion, Place 1 drop into both eyes 2 (two) times daily., Disp: , Rfl:    Review of Systems  Constitutional:  Negative for activity change, appetite change, chills, diaphoresis, fatigue, fever and unexpected weight change.  HENT:  Negative for congestion, rhinorrhea, sinus pressure, sneezing, sore throat and trouble swallowing.   Eyes:  Negative for photophobia and visual disturbance.  Respiratory:  Negative for cough, chest tightness, shortness of breath, wheezing and stridor.   Cardiovascular:  Negative for chest pain, palpitations and leg swelling.  Gastrointestinal:  Negative for abdominal  distention, abdominal pain, anal bleeding, blood in stool, constipation, diarrhea, nausea and vomiting.  Genitourinary:  Negative for difficulty urinating, dysuria, flank pain and hematuria.  Musculoskeletal:  Negative for arthralgias, back pain, gait problem, joint swelling and myalgias.  Skin:  Negative for color change, pallor, rash and wound.  Neurological:  Negative for dizziness, tremors, weakness and light-headedness.  Hematological:  Negative for adenopathy. Does not bruise/bleed easily.  Psychiatric/Behavioral:  Negative for agitation, behavioral problems, confusion, decreased concentration, dysphoric mood and sleep disturbance.        Objective:   Physical Exam Constitutional:      General: She is not in acute distress.    Appearance: Normal appearance. She is well-developed. She is not ill-appearing or diaphoretic.  HENT:     Head: Normocephalic and atraumatic.     Right Ear: Hearing and external ear normal.     Left Ear: Hearing and external ear normal.     Nose: No nasal deformity or rhinorrhea.  Eyes:     General: No scleral icterus.    Conjunctiva/sclera: Conjunctivae normal.     Right eye: Right conjunctiva is not injected.     Left eye: Left conjunctiva is not injected.     Pupils: Pupils are equal, round, and reactive to light.  Neck:     Vascular: No JVD.  Cardiovascular:     Rate and Rhythm: Normal rate and regular rhythm.     Heart sounds: S1 normal and S2 normal.  Pulmonary:     Effort: Pulmonary effort is normal. No respiratory distress.     Breath sounds: No wheezing.  Abdominal:     General: There is no distension.     Palpations: Abdomen is soft.  Musculoskeletal:  General: Normal range of motion.     Right shoulder: Normal.     Left shoulder: Normal.     Cervical back: Normal range of motion and neck supple.     Right hip: Normal.     Left hip: Normal.     Right knee: Normal.     Left knee: Normal.  Lymphadenopathy:     Head:     Right  side of head: No submandibular, preauricular or posterior auricular adenopathy.     Left side of head: No submandibular, preauricular or posterior auricular adenopathy.     Cervical: No cervical adenopathy.     Right cervical: No superficial or deep cervical adenopathy.    Left cervical: No superficial or deep cervical adenopathy.  Skin:    General: Skin is warm and dry.     Coloration: Skin is not pale.     Findings: No abrasion, bruising, ecchymosis, erythema, lesion or rash.     Nails: There is no clubbing.  Neurological:     General: No focal deficit present.     Mental Status: She is alert and oriented to person, place, and time.     Sensory: No sensory deficit.     Coordination: Coordination normal.     Gait: Gait normal.  Psychiatric:        Attention and Perception: She is attentive.        Mood and Affect: Mood normal.        Speech: Speech normal.        Behavior: Behavior normal. Behavior is cooperative.        Thought Content: Thought content normal.        Judgment: Judgment normal.           Assessment & Plan:   Assessment and Plan    Sepsis due to MSSA bacteremia Resolved MSSA bacteremia and pacemaker infection. Pacemaker explanted.   Rheumatoid arthritis Managed with methotrexate. Avoided potent RA medications due to past severe infections. - Continue methotrexate injections weekly. - Monitor for signs of infection.  Diabetes mellitus with diabetic foot infection History of diabetic foot infection with previous osteomyelitis and amputation. Current foot ulcers healing well. - Use nitroglycerin  patches for circulation as needed. --check ESR, CRP with caveat they could be up due to her RA   Severe osteoporosis Diagnosed with severe osteoporosis. Switched to Prolia  due to medication interactions. Prolia  administered two weeks ago. Discussed with pharmacy and there IS increased risk of infection with this biologic though apparently risk attenuates with  first 1-2 years of therapy. Encouraged further discussion with Dr. Nichole

## 2023-09-18 ENCOUNTER — Other Ambulatory Visit: Payer: Self-pay

## 2023-09-18 ENCOUNTER — Encounter: Payer: Self-pay | Admitting: Infectious Disease

## 2023-09-18 ENCOUNTER — Ambulatory Visit: Admitting: Infectious Disease

## 2023-09-18 VITALS — BP 148/60 | HR 72 | Temp 97.7°F | Ht 62.0 in | Wt 116.0 lb

## 2023-09-18 DIAGNOSIS — M5136 Other intervertebral disc degeneration, lumbar region with discogenic back pain only: Secondary | ICD-10-CM | POA: Diagnosis not present

## 2023-09-18 DIAGNOSIS — Z794 Long term (current) use of insulin: Secondary | ICD-10-CM

## 2023-09-18 DIAGNOSIS — R7881 Bacteremia: Secondary | ICD-10-CM

## 2023-09-18 DIAGNOSIS — L409 Psoriasis, unspecified: Secondary | ICD-10-CM | POA: Diagnosis not present

## 2023-09-18 DIAGNOSIS — M069 Rheumatoid arthritis, unspecified: Secondary | ICD-10-CM

## 2023-09-18 DIAGNOSIS — T827XXD Infection and inflammatory reaction due to other cardiac and vascular devices, implants and grafts, subsequent encounter: Secondary | ICD-10-CM | POA: Diagnosis not present

## 2023-09-18 DIAGNOSIS — S91109A Unspecified open wound of unspecified toe(s) without damage to nail, initial encounter: Secondary | ICD-10-CM | POA: Diagnosis not present

## 2023-09-18 DIAGNOSIS — M810 Age-related osteoporosis without current pathological fracture: Secondary | ICD-10-CM

## 2023-09-18 DIAGNOSIS — L089 Local infection of the skin and subcutaneous tissue, unspecified: Secondary | ICD-10-CM

## 2023-09-18 DIAGNOSIS — M7062 Trochanteric bursitis, left hip: Secondary | ICD-10-CM | POA: Diagnosis not present

## 2023-09-18 DIAGNOSIS — N1832 Chronic kidney disease, stage 3b: Secondary | ICD-10-CM | POA: Diagnosis not present

## 2023-09-18 DIAGNOSIS — E11628 Type 2 diabetes mellitus with other skin complications: Secondary | ICD-10-CM | POA: Diagnosis not present

## 2023-09-18 DIAGNOSIS — Z6822 Body mass index (BMI) 22.0-22.9, adult: Secondary | ICD-10-CM | POA: Diagnosis not present

## 2023-09-18 DIAGNOSIS — Z79899 Other long term (current) drug therapy: Secondary | ICD-10-CM | POA: Diagnosis not present

## 2023-09-18 DIAGNOSIS — M25562 Pain in left knee: Secondary | ICD-10-CM | POA: Diagnosis not present

## 2023-09-18 DIAGNOSIS — M1991 Primary osteoarthritis, unspecified site: Secondary | ICD-10-CM | POA: Diagnosis not present

## 2023-09-19 LAB — CBC WITH DIFFERENTIAL/PLATELET
Absolute Lymphocytes: 1669 {cells}/uL (ref 850–3900)
Absolute Monocytes: 1006 {cells}/uL — ABNORMAL HIGH (ref 200–950)
Basophils Absolute: 54 {cells}/uL (ref 0–200)
Basophils Relative: 0.5 %
Eosinophils Absolute: 524 {cells}/uL — ABNORMAL HIGH (ref 15–500)
Eosinophils Relative: 4.9 %
HCT: 31.8 % — ABNORMAL LOW (ref 35.0–45.0)
Hemoglobin: 10.2 g/dL — ABNORMAL LOW (ref 11.7–15.5)
MCH: 31.9 pg (ref 27.0–33.0)
MCHC: 32.1 g/dL (ref 32.0–36.0)
MCV: 99.4 fL (ref 80.0–100.0)
MPV: 10.9 fL (ref 7.5–12.5)
Monocytes Relative: 9.4 %
Neutro Abs: 7447 {cells}/uL (ref 1500–7800)
Neutrophils Relative %: 69.6 %
Platelets: 213 Thousand/uL (ref 140–400)
RBC: 3.2 Million/uL — ABNORMAL LOW (ref 3.80–5.10)
RDW: 15.8 % — ABNORMAL HIGH (ref 11.0–15.0)
Total Lymphocyte: 15.6 %
WBC: 10.7 Thousand/uL (ref 3.8–10.8)

## 2023-09-19 LAB — SEDIMENTATION RATE: Sed Rate: 17 mm/h (ref 0–30)

## 2023-09-19 LAB — BASIC METABOLIC PANEL WITHOUT GFR
BUN/Creatinine Ratio: 32 (calc) — ABNORMAL HIGH (ref 6–22)
BUN: 47 mg/dL — ABNORMAL HIGH (ref 7–25)
CO2: 27 mmol/L (ref 20–32)
Calcium: 8.6 mg/dL (ref 8.6–10.4)
Chloride: 108 mmol/L (ref 98–110)
Creat: 1.46 mg/dL — ABNORMAL HIGH (ref 0.60–0.95)
Glucose, Bld: 181 mg/dL — ABNORMAL HIGH (ref 65–99)
Potassium: 4.3 mmol/L (ref 3.5–5.3)
Sodium: 144 mmol/L (ref 135–146)

## 2023-09-19 LAB — C-REACTIVE PROTEIN: CRP: 4.5 mg/L (ref <8.0)

## 2023-09-23 ENCOUNTER — Ambulatory Visit

## 2023-09-23 DIAGNOSIS — I480 Paroxysmal atrial fibrillation: Secondary | ICD-10-CM

## 2023-09-24 DIAGNOSIS — M5416 Radiculopathy, lumbar region: Secondary | ICD-10-CM | POA: Diagnosis not present

## 2023-09-24 LAB — CUP PACEART REMOTE DEVICE CHECK
Date Time Interrogation Session: 20250720233705
Implantable Pulse Generator Implant Date: 20240708

## 2023-09-29 ENCOUNTER — Ambulatory Visit: Payer: Self-pay | Admitting: Cardiovascular Disease

## 2023-09-30 ENCOUNTER — Encounter

## 2023-10-03 DIAGNOSIS — R300 Dysuria: Secondary | ICD-10-CM | POA: Diagnosis not present

## 2023-10-03 DIAGNOSIS — N390 Urinary tract infection, site not specified: Secondary | ICD-10-CM | POA: Diagnosis not present

## 2023-10-15 DIAGNOSIS — M1712 Unilateral primary osteoarthritis, left knee: Secondary | ICD-10-CM | POA: Diagnosis not present

## 2023-10-15 DIAGNOSIS — M5416 Radiculopathy, lumbar region: Secondary | ICD-10-CM | POA: Diagnosis not present

## 2023-10-16 DIAGNOSIS — E119 Type 2 diabetes mellitus without complications: Secondary | ICD-10-CM | POA: Diagnosis not present

## 2023-10-16 DIAGNOSIS — E1140 Type 2 diabetes mellitus with diabetic neuropathy, unspecified: Secondary | ICD-10-CM | POA: Diagnosis not present

## 2023-10-16 DIAGNOSIS — R739 Hyperglycemia, unspecified: Secondary | ICD-10-CM | POA: Diagnosis not present

## 2023-10-16 DIAGNOSIS — I130 Hypertensive heart and chronic kidney disease with heart failure and stage 1 through stage 4 chronic kidney disease, or unspecified chronic kidney disease: Secondary | ICD-10-CM | POA: Diagnosis not present

## 2023-10-16 DIAGNOSIS — N390 Urinary tract infection, site not specified: Secondary | ICD-10-CM | POA: Diagnosis not present

## 2023-10-16 DIAGNOSIS — R350 Frequency of micturition: Secondary | ICD-10-CM | POA: Diagnosis not present

## 2023-10-23 NOTE — Progress Notes (Signed)
 [facility] Summary Report / Loop Recorder

## 2023-10-24 ENCOUNTER — Ambulatory Visit (INDEPENDENT_AMBULATORY_CARE_PROVIDER_SITE_OTHER)

## 2023-10-24 DIAGNOSIS — I480 Paroxysmal atrial fibrillation: Secondary | ICD-10-CM

## 2023-10-24 LAB — CUP PACEART REMOTE DEVICE CHECK
Date Time Interrogation Session: 20250820235040
Implantable Pulse Generator Implant Date: 20240708

## 2023-10-28 ENCOUNTER — Ambulatory Visit: Payer: Self-pay | Admitting: Cardiovascular Disease

## 2023-10-31 DIAGNOSIS — H04123 Dry eye syndrome of bilateral lacrimal glands: Secondary | ICD-10-CM | POA: Diagnosis not present

## 2023-11-05 ENCOUNTER — Encounter

## 2023-11-13 ENCOUNTER — Ambulatory Visit (INDEPENDENT_AMBULATORY_CARE_PROVIDER_SITE_OTHER): Admitting: Family

## 2023-11-13 DIAGNOSIS — S31000A Unspecified open wound of lower back and pelvis without penetration into retroperitoneum, initial encounter: Secondary | ICD-10-CM

## 2023-11-13 NOTE — Progress Notes (Addendum)
 Office Visit Note   Patient: Shelby Walker           Date of Birth: Oct 25, 1933           MRN: [id number] Visit Date: 11/13/2023              Requested by: Nichole Senior, MD [address] [place],  [place] [address] PCP: Nichole Senior, MD  Chief Complaint  Patient presents with   Lower Back - Wound Check      HPI: The patient is an 89 year old woman who presents today for evaluation of presumed pressure ulcer to her sacrum on the right.  Her daughters been following this at home she unfortunately has been spending more time in the chair and falls asleep unintentionally sitting up she often drifts over to the right side and has increased pressure here they have been using dry foam dressings  Assessment & Plan: Visit Diagnoses: No diagnosis found.  Plan: Reinforced need to alternate pressure using pillows or padding to shift the pressure from left to right throughout the day recommended frequent breaks from sitting at least once an hour daughter voiced understanding they will continue with her current foam dressings, change daily or prn soiled.  may use a zinc  oxide ointment  Follow-Up Instructions: No follow-ups on file.   Ortho Exam  Patient is alert, oriented, no adenopathy, well-dressed, normal affect, normal respiratory effort. On examination right buttock near the gluteal cleft there is a 2-1/2 cm x 5 mm. Is 1 mm deep. stage II pressure ulcer there is no drainage or surrounding erythema or signs of infection No debridement today.    Imaging: No results found. No images are attached to the encounter.  Labs: Lab Results  Component Value Date   HGBA1C 9.2 (H) 04/04/2023   HGBA1C 9.2 (H) 08/08/2022   HGBA1C 7.3 (H) 01/24/2022   ESRSEDRATE 17 09/18/2023   ESRSEDRATE 31 (H) 05/01/2022   ESRSEDRATE 22 03/27/2022   CRP 4.5 09/18/2023   CRP 8.0 (H) 08/12/2022   CRP 8.1 (H) 08/11/2022   REPTSTATUS 08/10/2022 FINAL 08/07/2022   CULT (A) 08/07/2022    ENTEROBACTER  CLOACAE SUSCEPTIBILITIES PERFORMED ON PREVIOUS CULTURE WITHIN THE LAST 5 DAYS. Performed at [facility], [address], [place], [place] [address]    LABORGA ENTEROBACTER CLOACAE 08/07/2022     Lab Results  Component Value Date   ALBUMIN  2.8 (L) 04/05/2023   ALBUMIN  3.4 (L) 04/04/2023   ALBUMIN  3.3 (L) 04/03/2023   PREALBUMIN 18 02/05/2022   PREALBUMIN 15.2 (L) 01/16/2021    Lab Results  Component Value Date   MG 2.4 04/05/2023   MG 2.3 08/12/2022   MG 2.3 08/11/2022   No results found for: [facility]  Lab Results  Component Value Date   PREALBUMIN 18 02/05/2022   PREALBUMIN 15.2 (L) 01/16/2021      Latest Ref Rng & Units 09/18/2023   10:37 AM 04/05/2023    5:20 AM 04/04/2023    4:14 AM  CBC EXTENDED  WBC 3.8 - 10.8 Thousand/uL 10.7  10.8  9.9   RBC 3.80 - 5.10 Million/uL 3.20  3.31  3.85   Hemoglobin 11.7 - 15.5 g/dL 89.7  89.8  88.2   HCT 35.0 - 45.0 % 31.8  32.1  37.8   Platelets 140 - 400 Thousand/uL 213  177  209   NEUT# 1,500 - 7,800 cells/uL 7,447   6.8   Lymph# 0.7 - 4.0 K/uL   1.7  There is no height or weight on file to calculate BMI.  Orders:  No orders of the defined types were placed in this encounter.  No orders of the defined types were placed in this encounter.    Procedures: No procedures performed  Clinical Data: No additional findings.  ROS:  All other systems negative, except as noted in the HPI. Review of Systems  Objective: Vital Signs: There were no vitals taken for this visit.  Specialty Comments:  EXAM: MRI LUMBAR SPINE WITHOUT CONTRAST   TECHNIQUE: Multiplanar, multisequence MR imaging of the lumbar spine was performed. No intravenous contrast was administered.   COMPARISON:  RADIOGRAPHY OF THE LUMBAR DATED 11/16/2020. RADIOGRAPHY OF THE LUMBAR DATED 08/07/2018.   FINDINGS: Segmentation:  Standard.   Alignment: Grade 1 anterolisthesis of L3 on L4, L4 on L5 and L5-S1 secondary to facet disease.    Vertebrae: No acute fracture, evidence of discitis, or aggressive bone lesion.   Conus medullaris and cauda equina: Conus extends to the L1-2 level. Conus and cauda equina appear normal.   Paraspinal and other soft tissues: No acute paraspinal abnormality. Right renal cyst.   Disc levels:   Disc spaces: Degenerative disease with disc height loss at L3-4, L4-5 and L5-S1.   T12-L1: Minimal broad-based disc bulge. No foraminal or central canal stenosis.   L1-L2: No significant disc bulge. No neural foraminal stenosis. No central canal stenosis.   L2-L3: Minimal broad-based disc bulge. Mild bilateral facet arthropathy. No foraminal or central canal stenosis.   L3-L4: Broad-based disc bulge with a broad left paracentral disc protrusion with mass effect on the left intraspinal L4 nerve root. Moderate bilateral facet arthropathy. Moderate-severe spinal stenosis. Left subarticular recess stenosis. Moderate left foraminal stenosis. Mild right foraminal stenosis.   L4-L5: Broad-based disc bulge with a broad central disc protrusion. Severe bilateral facet arthropathy with ligamentum flavum infolding. Severe spinal stenosis. Mild right and severe left foraminal stenosis.   L5-S1: Mild broad-based disc bulge with a broad shallow right paracentral disc protrusion with mass effect on the right intraspinal S1 nerve root. Mild bilateral foraminal stenosis. Mild bilateral facet arthropathy. No spinal stenosis.   IMPRESSION: 1. Lumbar spine spondylosis as described above most severe at L3-4, L4-5 and L5-S1. 2. No acute osseous injury of the lumbar spine.     Electronically Signed   By: Julaine Blanch M.D.   On: 11/23/2020 08:11  PMFS History: Patient Active Problem List   Diagnosis Date Noted   Elevated LFTs 04/04/2023   Anemia 04/04/2023   Choledocholithiasis 04/03/2023   Acute biliary pancreatitis 08/07/2022   Sepsis (HCC) 08/07/2022   Sacral wound 05/14/2022   Coronary  artery disease involving native coronary artery of native heart without angina pectoris 02/21/2022   Discitis of lumbar region 02/05/2022   Herpes zoster without complication 02/05/2022   Clostridium difficile colitis 02/05/2022   Severe sepsis with acute organ dysfunction due to Gram positive bacteria (HCC) 02/02/2022   Pacemaker infection (HCC) 02/01/2022   Periostitis of lumbar spine without osteomyelitis (HCC) 02/01/2022   Acute midline low back pain without sciatica 01/26/2022   MSSA bacteremia 01/25/2022   Presence of cardiac pacemaker 01/25/2022   Severe sepsis with septic shock (HCC) 01/24/2022   AKI (acute kidney injury) (HCC) 01/24/2022   Sinus pause 01/04/2022   Acute viral bronchitis 02/06/2021   Acute blood loss anemia    Postoperative pain    S/P transmetatarsal amputation of foot, right (HCC) 01/24/2021   Protein-calorie malnutrition, severe 01/17/2021   Diabetic  foot infection (HCC) 01/16/2021   Rheumatoid arthritis (HCC) 01/16/2021   Stage 3b chronic kidney disease (CKD) (HCC) 01/16/2021   DNR (do not resuscitate) 01/16/2021   Decreased anal sphincter tone 12/13/2018   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   Irritable bowel syndrome with diarrhea - post infectious 12/12/2018   Full incontinence of feces and fecal smearing 12/12/2018   Perianal dermatitis 12/12/2018   Uncontrolled type 2 diabetes mellitus with hyperglycemia (HCC) 10/26/2018   Long-term use of immunosuppressant medication-Enbrel 10/26/2018   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 08/27/2018   Thrombocytopenia (HCC)    Essential hypertension    Dyspnea on exertion    Steroid-induced hyperglycemia    Fatigue 08/06/2018   Paroxysmal atrial fibrillation (HCC)    Mixed hyperlipidemia 09/29/2016   Stable angina (HCC) 09/29/2016   PAD (peripheral artery disease) (HCC) 08/09/2016   Preoperative clearance 05/21/2012   Precordial chest pain    Palpitations    Diabetes mellitus (HCC)    GERD  (gastroesophageal reflux disease)    Ejection fraction    Carotid artery disease (HCC)    Mitral regurgitation 04/21/2009   PSORIASIS 02/09/2009   Rheumatoid arthritis flare (HCC) 02/09/2009   Past Medical History:  Diagnosis Date   Anemia    Bruit    Carotid Doppler showed no significant abnormality     9per patient)   C. difficile colitis 07/2018   with severe sepsis   Chest pain, unspecified    Nuclear, May, 2008, no scar or ischemia   Chronic kidney disease    Diabetes mellitus    Diverticulosis    Dyslipidemia    Dysrhythmia    a-fib   Ejection fraction    EF 55-60%, echo, February, 2011 // Echocardiogram 8/21: EF 60-65, no RWMA, Gr 1 DD, GLS -14%, normal RVSF, mild LAE, trivial MR, mild MS (mean 4 mmHg), RVSP 23.4    GERD (gastroesophageal reflux disease)    History of kidney stones    removed   HLD (hyperlipidemia)    Implantable loop recorder present    Memory loss    Mitral regurgitation 04/21/2009   mild,  echo, February, 2011   Osteoporosis    Palpitations    possible very brief atrial fibrillation on monitor and possible reentrant tachycardia   Pneumonia    PONV (postoperative nausea and vomiting)    Psoriasis    Rheumatoid arthritis (HCC)     Family History  Problem Relation Age of Onset   Heart failure Father    Heart attack Father    Diabetes Father    Heart attack Mother    Heart failure Mother    Diabetes Mother    Stroke Sister     Past Surgical History:  Procedure Laterality Date   ABDOMINAL AORTOGRAM W/LOWER EXTREMITY Right 10/19/2020   Procedure: ABDOMINAL AORTOGRAM W/LOWER EXTREMITY;  Surgeon: Darron Deatrice LABOR, MD;  Location: [facility];  Service: Cardiovascular;  Laterality: Right;   CHOLECYSTECTOMY N/A 08/09/2022   Procedure: LAPAROSCOPIC CHOLECYSTECTOMY WITH INTRAOPERATIVE CHOLANGIOGRAM;  Surgeon: Lyndel Deward PARAS, MD;  Location: [facility];  Service: General;  Laterality: N/A;   ENDOSCOPIC RETROGRADE CHOLANGIOPANCREATOGRAPHY  (ERCP) WITH PROPOFOL  N/A 04/05/2023   Procedure: ENDOSCOPIC RETROGRADE CHOLANGIOPANCREATOGRAPHY (ERCP) WITH PROPOFOL ;  Surgeon: Abran Norleen SAILOR, MD;  Location: [facility];  Service: Gastroenterology;  Laterality: N/A;   FRACTURE SURGERY     KYPHOPLASTY N/A 04/25/2023   Procedure: LUMBAR 1 KYPHOPLASTY;  Surgeon: Beuford Anes, MD;  Location: [facility];  Service: Orthopedics;  Laterality: N/A;  LOOP Recorder placement     LOOP RECORDER REMOVAL N/A 01/05/2022   Procedure: LOOP RECORDER REMOVAL;  Surgeon: Waddell Danelle ORN, MD;  Location: [facility];  Service: Cardiovascular;  Laterality: N/A;   PACEMAKER IMPLANT N/A 01/05/2022   Procedure: [employer] PACEMAKER IMPLANT;  Surgeon: Waddell Danelle ORN, MD;  Location: [facility];  Service: Cardiovascular;  Laterality: N/A;   PPM GENERATOR REMOVAL N/A 01/29/2022   Procedure: PPM GENERATOR REMOVAL;  Surgeon: Waddell Danelle ORN, MD;  Location: [facility];  Service: Cardiovascular;  Laterality: N/A;   REMOVAL OF STONES  04/05/2023   Procedure: REMOVAL OF STONES;  Surgeon: Abran Norleen SAILOR, MD;  Location: [facility];  Service: Gastroenterology;;   ANNETT  04/05/2023   Procedure: ANNETT;  Surgeon: Abran Norleen SAILOR, MD;  Location: [facility];  Service: Gastroenterology;;   TEE WITHOUT CARDIOVERSION N/A 01/29/2022   Procedure: TRANSESOPHAGEAL ECHOCARDIOGRAM (TEE);  Surgeon: Barbaraann Darryle Ned, MD;  Location: [facility];  Service: Cardiovascular;  Laterality: N/A;   TRANSMETATARSAL AMPUTATION Right 01/18/2021   Procedure: TRANSMETATARSAL AMPUTATION;  Surgeon: Kit Norleen, MD;  Location: [facility] OR;  Service: Orthopedics;  Laterality: Right;   Social History   Occupational History    Employer: RETIRED  Tobacco Use   Smoking status: Never   Smokeless tobacco: Never  Vaping Use   Vaping status: Never Used  Substance and Sexual Activity   Alcohol  use: No    Alcohol /week: 0.0 standard drinks of alcohol    Drug use: No   Sexual  activity: Yes    Birth control/protection: Post-menopausal

## 2023-11-15 ENCOUNTER — Encounter: Payer: Self-pay | Admitting: Family

## 2023-11-25 ENCOUNTER — Ambulatory Visit (INDEPENDENT_AMBULATORY_CARE_PROVIDER_SITE_OTHER)

## 2023-11-25 DIAGNOSIS — I480 Paroxysmal atrial fibrillation: Secondary | ICD-10-CM | POA: Diagnosis not present

## 2023-11-25 LAB — CUP PACEART REMOTE DEVICE CHECK
Date Time Interrogation Session: 20250921233312
Implantable Pulse Generator Implant Date: 20240708

## 2023-11-26 NOTE — Progress Notes (Signed)
 Remote Loop Recorder Transmission

## 2023-11-26 NOTE — Telephone Encounter (Signed)
I addended my note 

## 2023-11-27 ENCOUNTER — Ambulatory Visit: Payer: Self-pay | Admitting: Cardiovascular Disease

## 2023-11-27 NOTE — Progress Notes (Signed)
 Cardiology Office Note:  .   Date:  12/10/2023  ID:  Shelby Walker, DOB 12/03/1933, MRN [id number] PCP: Nichole Senior, MD  [facility] Providers Cardiologist:  Aleene Passe, MD (Inactive) Electrophysiologist:  Elspeth Sage, MD    History of Present Illness: Shelby Walker is a 89 y.o. female PAD, HLD, DM, PAF, PPM for 25 sec pause explanted due to infection chest pain.  CAD as noted on coronary CTA 12/13/21 with FFR significant stenosis in the D1 recommended for medical therapy due to advanced age. Moderate nonobstructive disease including 50-69% left main stenosis, LAD, and RCA disease.    She has a history of PAF monitored on ILR which then captured a 25 sec pause but no Afib. She underwent PPM implantation 01/2022 with Dr. Waddell, followed by EP. ILR removed. She was admitted 11/22-12/1/23 with MSSA sepsis complicated by DIC, AKI, and elevated troponin. Sepsis felt related to her PPM and underwent PPM explantation. Recurrent Afib and started on amiodarone .  OAC was deferred at that time given high fall risk.  Elevated troponin in the setting of sepsis, felt not a candidate for LHC given DIC and AKI. Second PPM was not planned unless symptomatic bradycardia. Heart monitor 02/2022 showed no symptomatic bradycardia. She was seen by Dr. Sage 03/2022 who reduced amiodarone  to 100 mg daily.  ILR implanted 09/2022.  Last check 11/25/23-no episodes.  Patient  here with her daughter. Denies chest pain, palpitations, dyspnea, edema. Can't walk much due to arthritis. Still lives alone but has family close by. BP at home 118-130/80's  ROS:    Studies Reviewed: SABRA    EKG Interpretation Date/Time:  Tuesday December 10 2023 11:24:02 EDT Ventricular Rate:  69 PR Interval:  188 QRS Duration:  88 QT Interval:  414 QTC Calculation: 443 R Axis:   11  Text Interpretation: Normal sinus rhythm Nonspecific ST abnormality When compared with ECG of 03-Apr-2023 22:37, PREVIOUS ECG IS PRESENT Confirmed by  Shelby Walker [phone]) on 12/10/2023 11:28:33 AM    Prior CV Studies:   Echo 01/25/22: 1. Left ventricular ejection fraction, by estimation, is 50 to 55%. The  left ventricle has low normal function. The left ventricle has no regional  wall motion abnormalities. Left ventricular diastolic parameters are  consistent with Grade II diastolic  dysfunction (pseudonormalization).   2. Right ventricular systolic function is normal. The right ventricular  size is normal. There is normal pulmonary artery systolic pressure. The  estimated right ventricular systolic pressure is 34.7 mmHg.   3. Left atrial size was moderately dilated.   4. The mitral valve is degenerative. Mild mitral valve regurgitation. The  mean mitral valve gradient is 4.0 mmHg with average heart rate of 77 bpm.   5. The aortic valve is tricuspid. There is mild calcification of the  aortic valve. There is mild thickening of the aortic valve. Aortic valve  regurgitation is not visualized. Aortic valve sclerosis is present, with  no evidence of aortic valve stenosis.   6. The inferior vena cava is dilated in size with <50% respiratory  variability, suggesting right atrial pressure of 15 mmHg.       Risk Assessment/Calculations:    CHA2DS2-VASc Score = 5   This indicates a 7.2% annual risk of stroke. The patient's score is based upon: CHF History: 0 HTN History: 1 Diabetes History: 0 Stroke History: 0 Vascular Disease History: 1 Age Score: 2 Gender Score: 1  Physical Exam:   VS:  BP 118/80   Pulse 69   Ht 5' 2 (1.575 m)   Wt 116 lb (52.6 kg)   SpO2 99%   BMI 21.22 kg/m    Orhtostatics: No data found. Wt Readings from Last 3 Encounters:  12/10/23 116 lb (52.6 kg)  09/18/23 116 lb (52.6 kg)  04/25/23 120 lb (54.4 kg)    GEN: Well nourished, well developed in no acute distress NECK: No JVD; bilateral carotid bruits CARDIAC:  RRR, 2/6 systolic murmur LSB RESPIRATORY:  Decreased at bases otherwise  Clear to auscultation without rales, wheezing or rhonchi  ABDOMEN: Soft, non-tender, non-distended EXTREMITIES:  trace edema; No deformity   ASSESSMENT AND PLAN: .   Paroxysmal atrial fibrillation Currently in normal sinus rhythm.  Not anticoagulated due to high fall risk.  01/2022 Echocardiogram shows LVEF of 50-55% with grade 2 diastolic dysfunction. Continue p.o. amiodarone  100mg -labs 09/2023 on KPN stable, due for CXR-will ask PCP to do tomorrow ILR remote 11/25/23 no recent Afib   Hx of 25-sec pause PPM explanted due to infection Avoid BB,   CAD Coronary CTA shows left main disease that has been stable.  Denies any anginal symptoms.  Previously not felt to be a candidate for cardiac catheterization. Continue atorvastatin  20 mg daily   Hypertension BP runs too low for meds now.  HLD LDL 39 08/2023 on lipitor  Rheumatoid arthritis on methotrexate and perdnisone. Still having a lot of pain. F/u with rheum  Carotid stenosis 1-39% 2020-will repeat dopplers.          Dispo: f/u with new cardiologist and EPS to establish  Signed, Shelby Pavy, PA-C

## 2023-12-04 ENCOUNTER — Ambulatory Visit: Admitting: Family

## 2023-12-04 ENCOUNTER — Other Ambulatory Visit (INDEPENDENT_AMBULATORY_CARE_PROVIDER_SITE_OTHER): Payer: Self-pay

## 2023-12-04 DIAGNOSIS — M5416 Radiculopathy, lumbar region: Secondary | ICD-10-CM | POA: Diagnosis not present

## 2023-12-04 DIAGNOSIS — L8996 Pressure-induced deep tissue damage of unspecified site: Secondary | ICD-10-CM | POA: Diagnosis not present

## 2023-12-04 MED ORDER — PREDNISONE 20 MG PO TABS: 20.0000 mg | ORAL_TABLET | Freq: Every day | ORAL | 0 refills | Status: DC

## 2023-12-04 NOTE — Progress Notes (Signed)
 Office Visit Note   Patient: Shelby Walker           Date of Birth: 1933-11-09           MRN: [id number] Visit Date: 12/04/2023              Requested by: Nichole Senior, MD [address] [place],  [place] [address] PCP: Nichole Senior, MD  Chief Complaint  Patient presents with   Left Foot - Wound Check      HPI: The patient is an 89 year old woman who presents today for evaluation of new concern of ulcer to the medial aspect of her left heel.  She has an area that is discolored and exquisitely tender her daughter is concerned.  She is also having significant discomfort in the left lower extremity and low back she spends the majority of her time seated now  Her activity has decreased significantly she has difficulty getting from a seated to a standing position and ambulating due to low back pain and what sounds like radicular pain down the left lower extremity  When seated she often leans to the right side unfortunately this has led to decubitus ulcer of the sacrum and may be irritating her back  Is a patient of Dr. Beuford, he performed kyphoplasty L1 earlier this year.  Assessment & Plan: Visit Diagnoses:  1. Radiculopathy, lumbar region   2. Pressure injury of skin with suspected deep tissue injury     Plan: Deep tissue injury left heel discussed offloading and floating the heels will follow the skin closely  Trial her on a course of prednisone  for her lumbar radiculopathy left-sided  Follow-Up Instructions: No follow-ups on file.   Back Exam   Tenderness  The patient is experiencing tenderness in the lumbar.  Muscle Strength  Right Quadriceps:  3/5  Left Quadriceps:  3/5  Right Hamstrings:  3/5  Left Hamstrings:  3/5   Tests  Straight leg raise left: positive      Patient is alert, oriented, no adenopathy, well-dressed, normal affect, normal respiratory effort. On examination left heel medially there is an area of hyperpigmentation with exquisite  tenderness there is no erythema warmth or skin breakdown concern for deep tissue injury    Imaging: No results found. No images are attached to the encounter.  Labs: Lab Results  Component Value Date   HGBA1C 9.2 (H) 04/04/2023   HGBA1C 9.2 (H) 08/08/2022   HGBA1C 7.3 (H) 01/24/2022   ESRSEDRATE 17 09/18/2023   ESRSEDRATE 31 (H) 05/01/2022   ESRSEDRATE 22 03/27/2022   CRP 4.5 09/18/2023   CRP 8.0 (H) 08/12/2022   CRP 8.1 (H) 08/11/2022   REPTSTATUS 08/10/2022 FINAL 08/07/2022   CULT (A) 08/07/2022    ENTEROBACTER CLOACAE SUSCEPTIBILITIES PERFORMED ON PREVIOUS CULTURE WITHIN THE LAST 5 DAYS. Performed at [facility], [address], [place], [place] [address]    LABORGA ENTEROBACTER CLOACAE 08/07/2022     Lab Results  Component Value Date   ALBUMIN  2.8 (L) 04/05/2023   ALBUMIN  3.4 (L) 04/04/2023   ALBUMIN  3.3 (L) 04/03/2023   PREALBUMIN 18 02/05/2022   PREALBUMIN 15.2 (L) 01/16/2021    Lab Results  Component Value Date   MG 2.4 04/05/2023   MG 2.3 08/12/2022   MG 2.3 08/11/2022   No results found for: [facility]  Lab Results  Component Value Date   PREALBUMIN 18 02/05/2022   PREALBUMIN 15.2 (L) 01/16/2021      Latest Ref Rng & Units  09/18/2023   10:37 AM 04/05/2023    5:20 AM 04/04/2023    4:14 AM  CBC EXTENDED  WBC 3.8 - 10.8 Thousand/uL 10.7  10.8  9.9   RBC 3.80 - 5.10 Million/uL 3.20  3.31  3.85   Hemoglobin 11.7 - 15.5 g/dL 89.7  89.8  88.2   HCT 35.0 - 45.0 % 31.8  32.1  37.8   Platelets 140 - 400 Thousand/uL 213  177  209   NEUT# 1,500 - 7,800 cells/uL 7,447   6.8   Lymph# 0.7 - 4.0 K/uL   1.7      There is no height or weight on file to calculate BMI.  Orders:  Orders Placed This Encounter  Procedures   XR Lumbar Spine 2-3 Views   No orders of the defined types were placed in this encounter.    Procedures: No procedures performed  Clinical Data: No additional findings.  ROS:  All other systems negative, except as noted in  the HPI. Review of Systems  Objective: Vital Signs: There were no vitals taken for this visit.  Specialty Comments:  EXAM: MRI LUMBAR SPINE WITHOUT CONTRAST   TECHNIQUE: Multiplanar, multisequence MR imaging of the lumbar spine was performed. No intravenous contrast was administered.   COMPARISON:  RADIOGRAPHY OF THE LUMBAR DATED 11/16/2020. RADIOGRAPHY OF THE LUMBAR DATED 08/07/2018.   FINDINGS: Segmentation:  Standard.   Alignment: Grade 1 anterolisthesis of L3 on L4, L4 on L5 and L5-S1 secondary to facet disease.   Vertebrae: No acute fracture, evidence of discitis, or aggressive bone lesion.   Conus medullaris and cauda equina: Conus extends to the L1-2 level. Conus and cauda equina appear normal.   Paraspinal and other soft tissues: No acute paraspinal abnormality. Right renal cyst.   Disc levels:   Disc spaces: Degenerative disease with disc height loss at L3-4, L4-5 and L5-S1.   T12-L1: Minimal broad-based disc bulge. No foraminal or central canal stenosis.   L1-L2: No significant disc bulge. No neural foraminal stenosis. No central canal stenosis.   L2-L3: Minimal broad-based disc bulge. Mild bilateral facet arthropathy. No foraminal or central canal stenosis.   L3-L4: Broad-based disc bulge with a broad left paracentral disc protrusion with mass effect on the left intraspinal L4 nerve root. Moderate bilateral facet arthropathy. Moderate-severe spinal stenosis. Left subarticular recess stenosis. Moderate left foraminal stenosis. Mild right foraminal stenosis.   L4-L5: Broad-based disc bulge with a broad central disc protrusion. Severe bilateral facet arthropathy with ligamentum flavum infolding. Severe spinal stenosis. Mild right and severe left foraminal stenosis.   L5-S1: Mild broad-based disc bulge with a broad shallow right paracentral disc protrusion with mass effect on the right intraspinal S1 nerve root. Mild bilateral foraminal stenosis.  Mild bilateral facet arthropathy. No spinal stenosis.   IMPRESSION: 1. Lumbar spine spondylosis as described above most severe at L3-4, L4-5 and L5-S1. 2. No acute osseous injury of the lumbar spine.     Electronically Signed   By: Julaine Blanch M.D.   On: 11/23/2020 08:11  PMFS History: Patient Active Problem List   Diagnosis Date Noted   Elevated LFTs 04/04/2023   Anemia 04/04/2023   Choledocholithiasis 04/03/2023   Acute biliary pancreatitis 08/07/2022   Sepsis (HCC) 08/07/2022   Sacral wound 05/14/2022   Coronary artery disease involving native coronary artery of native heart without angina pectoris 02/21/2022   Discitis of lumbar region 02/05/2022   Herpes zoster without complication 02/05/2022   Clostridium difficile colitis 02/05/2022   Severe sepsis  with acute organ dysfunction due to Gram positive bacteria (HCC) 02/02/2022   Pacemaker infection 02/01/2022   Periostitis of lumbar spine without osteomyelitis (HCC) 02/01/2022   Acute midline low back pain without sciatica 01/26/2022   MSSA bacteremia 01/25/2022   Presence of cardiac pacemaker 01/25/2022   Severe sepsis with septic shock (HCC) 01/24/2022   AKI (acute kidney injury) 01/24/2022   Sinus pause 01/04/2022   Acute viral bronchitis 02/06/2021   Acute blood loss anemia    Postoperative pain    S/P transmetatarsal amputation of foot, right (HCC) 01/24/2021   Protein-calorie malnutrition, severe 01/17/2021   Diabetic foot infection (HCC) 01/16/2021   Rheumatoid arthritis (HCC) 01/16/2021   Stage 3b chronic kidney disease (CKD) (HCC) 01/16/2021   DNR (do not resuscitate) 01/16/2021   Decreased anal sphincter tone 12/13/2018   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   Irritable bowel syndrome with diarrhea - post infectious 12/12/2018   Full incontinence of feces and fecal smearing 12/12/2018   Perianal dermatitis 12/12/2018   Uncontrolled type 2 diabetes mellitus with hyperglycemia (HCC) 10/26/2018   Long-term use of  immunosuppressant medication-Enbrel 10/26/2018   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 08/27/2018   Thrombocytopenia    Essential hypertension    Dyspnea on exertion    Steroid-induced hyperglycemia    Fatigue 08/06/2018   Paroxysmal atrial fibrillation (HCC)    Mixed hyperlipidemia 09/29/2016   Stable angina 09/29/2016   PAD (peripheral artery disease) 08/09/2016   Preoperative clearance 05/21/2012   Precordial chest pain    Palpitations    Diabetes mellitus (HCC)    GERD (gastroesophageal reflux disease)    Ejection fraction    Carotid artery disease    Mitral regurgitation 04/21/2009   PSORIASIS 02/09/2009   Rheumatoid arthritis flare (HCC) 02/09/2009   Past Medical History:  Diagnosis Date   Anemia    Bruit    Carotid Doppler showed no significant abnormality     9per patient)   C. difficile colitis 07/2018   with severe sepsis   Chest pain, unspecified    Nuclear, May, 2008, no scar or ischemia   Chronic kidney disease    Diabetes mellitus    Diverticulosis    Dyslipidemia    Dysrhythmia    a-fib   Ejection fraction    EF 55-60%, echo, February, 2011 // Echocardiogram 8/21: EF 60-65, no RWMA, Gr 1 DD, GLS -14%, normal RVSF, mild LAE, trivial MR, mild MS (mean 4 mmHg), RVSP 23.4    GERD (gastroesophageal reflux disease)    History of kidney stones    removed   HLD (hyperlipidemia)    Implantable loop recorder present    Memory loss    Mitral regurgitation 04/21/2009   mild,  echo, February, 2011   Osteoporosis    Palpitations    possible very brief atrial fibrillation on monitor and possible reentrant tachycardia   Pneumonia    PONV (postoperative nausea and vomiting)    Psoriasis    Rheumatoid arthritis (HCC)     Family History  Problem Relation Age of Onset   Heart failure Father    Heart attack Father    Diabetes Father    Heart attack Mother    Heart failure Mother    Diabetes Mother    Stroke Sister     Past Surgical History:   Procedure Laterality Date   ABDOMINAL AORTOGRAM W/LOWER EXTREMITY Right 10/19/2020   Procedure: ABDOMINAL AORTOGRAM W/LOWER EXTREMITY;  Surgeon: Darron Deatrice LABOR, MD;  Location: [facility];  Service: Cardiovascular;  Laterality: Right;   CHOLECYSTECTOMY N/A 08/09/2022   Procedure: LAPAROSCOPIC CHOLECYSTECTOMY WITH INTRAOPERATIVE CHOLANGIOGRAM;  Surgeon: Lyndel Deward PARAS, MD;  Location: [facility] OR;  Service: General;  Laterality: N/A;   ENDOSCOPIC RETROGRADE CHOLANGIOPANCREATOGRAPHY (ERCP) WITH PROPOFOL  N/A 04/05/2023   Procedure: ENDOSCOPIC RETROGRADE CHOLANGIOPANCREATOGRAPHY (ERCP) WITH PROPOFOL ;  Surgeon: Abran Norleen SAILOR, MD;  Location: [facility];  Service: Gastroenterology;  Laterality: N/A;   FRACTURE SURGERY     KYPHOPLASTY N/A 04/25/2023   Procedure: LUMBAR 1 KYPHOPLASTY;  Surgeon: Beuford Anes, MD;  Location: [facility] OR;  Service: Orthopedics;  Laterality: N/A;   LOOP Recorder placement     LOOP RECORDER REMOVAL N/A 01/05/2022   Procedure: LOOP RECORDER REMOVAL;  Surgeon: Waddell Danelle ORN, MD;  Location: [facility];  Service: Cardiovascular;  Laterality: N/A;   PACEMAKER IMPLANT N/A 01/05/2022   Procedure: [employer] PACEMAKER IMPLANT;  Surgeon: Waddell Danelle ORN, MD;  Location: [facility];  Service: Cardiovascular;  Laterality: N/A;   PPM GENERATOR REMOVAL N/A 01/29/2022   Procedure: PPM GENERATOR REMOVAL;  Surgeon: Waddell Danelle ORN, MD;  Location: [facility];  Service: Cardiovascular;  Laterality: N/A;   REMOVAL OF STONES  04/05/2023   Procedure: REMOVAL OF STONES;  Surgeon: Abran Norleen SAILOR, MD;  Location: [facility];  Service: Gastroenterology;;   ANNETT  04/05/2023   Procedure: ANNETT;  Surgeon: Abran Norleen SAILOR, MD;  Location: [facility];  Service: Gastroenterology;;   TEE WITHOUT CARDIOVERSION N/A 01/29/2022   Procedure: TRANSESOPHAGEAL ECHOCARDIOGRAM (TEE);  Surgeon: Barbaraann Darryle Ned, MD;  Location: [facility];  Service:  Cardiovascular;  Laterality: N/A;   TRANSMETATARSAL AMPUTATION Right 01/18/2021   Procedure: TRANSMETATARSAL AMPUTATION;  Surgeon: Kit Norleen, MD;  [facility];  Service: Orthopedics;  Laterality: Right;   Social History   Occupational History    Employer: RETIRED  Tobacco Use   Smoking status: Never   Smokeless tobacco: Never  Vaping Use   Vaping status: Never Used  Substance and Sexual Activity   Alcohol  use: No    Alcohol /week: 0.0 standard drinks of alcohol    Drug use: No   Sexual activity: Yes    Birth control/protection: Post-menopausal

## 2023-12-09 ENCOUNTER — Encounter

## 2023-12-09 NOTE — Progress Notes (Signed)
 Remote Loop Recorder Transmission

## 2023-12-10 ENCOUNTER — Ambulatory Visit: Attending: Physician Assistant | Admitting: Physician Assistant

## 2023-12-10 ENCOUNTER — Encounter: Payer: Self-pay | Admitting: Physician Assistant

## 2023-12-10 ENCOUNTER — Telehealth: Payer: Self-pay

## 2023-12-10 VITALS — BP 118/80 | HR 69 | Ht 62.0 in | Wt 116.0 lb

## 2023-12-10 DIAGNOSIS — I10 Essential (primary) hypertension: Secondary | ICD-10-CM | POA: Insufficient documentation

## 2023-12-10 DIAGNOSIS — I2510 Atherosclerotic heart disease of native coronary artery without angina pectoris: Secondary | ICD-10-CM | POA: Diagnosis not present

## 2023-12-10 DIAGNOSIS — I455 Other specified heart block: Secondary | ICD-10-CM | POA: Diagnosis not present

## 2023-12-10 DIAGNOSIS — I6523 Occlusion and stenosis of bilateral carotid arteries: Secondary | ICD-10-CM | POA: Diagnosis not present

## 2023-12-10 DIAGNOSIS — I480 Paroxysmal atrial fibrillation: Secondary | ICD-10-CM | POA: Insufficient documentation

## 2023-12-10 NOTE — Patient Instructions (Signed)
 Medication Instructions:  Your physician recommends that you continue on your current medications as directed. Please refer to the Current Medication list given to you today.  *If you need a refill on your cardiac medications before your next appointment, please call your pharmacy*   Testing/Procedures: Your physician has requested that you have a carotid duplex. This test is an ultrasound of the carotid arteries in your neck. It looks at blood flow through these arteries that supply the brain with blood. Allow one hour for this exam. There are no restrictions or special instructions.   Follow-Up: At [facility], you and your health needs are our priority.  As part of our continuing mission to provide you with exceptional heart care, our providers are all part of one team.  This team includes your primary Cardiologist (physician) and Advanced Practice Providers or APPs (Physician Assistants and Nurse Practitioners) who all work together to provide you with the care you need, when you need it.  Your next appointment:   4 month(s)  Provider:   Emeline Calender, DO    Please see Dr. Nancey in 1-2 months.

## 2023-12-10 NOTE — Telephone Encounter (Signed)
 Would NOT sch any of the upcoming appts.  Stated she would call when she got back from out of town.  12-09-23  VB

## 2023-12-11 DIAGNOSIS — I25119 Atherosclerotic heart disease of native coronary artery with unspecified angina pectoris: Secondary | ICD-10-CM | POA: Diagnosis not present

## 2023-12-11 DIAGNOSIS — N1832 Chronic kidney disease, stage 3b: Secondary | ICD-10-CM | POA: Diagnosis not present

## 2023-12-11 DIAGNOSIS — I480 Paroxysmal atrial fibrillation: Secondary | ICD-10-CM | POA: Diagnosis not present

## 2023-12-11 DIAGNOSIS — I6523 Occlusion and stenosis of bilateral carotid arteries: Secondary | ICD-10-CM | POA: Diagnosis not present

## 2023-12-11 DIAGNOSIS — Z23 Encounter for immunization: Secondary | ICD-10-CM | POA: Diagnosis not present

## 2023-12-11 DIAGNOSIS — E785 Hyperlipidemia, unspecified: Secondary | ICD-10-CM | POA: Diagnosis not present

## 2023-12-11 DIAGNOSIS — I679 Cerebrovascular disease, unspecified: Secondary | ICD-10-CM | POA: Diagnosis not present

## 2023-12-11 DIAGNOSIS — M810 Age-related osteoporosis without current pathological fracture: Secondary | ICD-10-CM | POA: Diagnosis not present

## 2023-12-11 DIAGNOSIS — E1140 Type 2 diabetes mellitus with diabetic neuropathy, unspecified: Secondary | ICD-10-CM | POA: Diagnosis not present

## 2023-12-11 DIAGNOSIS — R001 Bradycardia, unspecified: Secondary | ICD-10-CM | POA: Diagnosis not present

## 2023-12-11 DIAGNOSIS — D649 Anemia, unspecified: Secondary | ICD-10-CM | POA: Diagnosis not present

## 2023-12-11 DIAGNOSIS — Z794 Long term (current) use of insulin: Secondary | ICD-10-CM | POA: Diagnosis not present

## 2023-12-11 DIAGNOSIS — I129 Hypertensive chronic kidney disease with stage 1 through stage 4 chronic kidney disease, or unspecified chronic kidney disease: Secondary | ICD-10-CM | POA: Diagnosis not present

## 2023-12-20 ENCOUNTER — Encounter: Payer: Self-pay | Admitting: Family

## 2023-12-25 ENCOUNTER — Encounter

## 2023-12-26 ENCOUNTER — Ambulatory Visit

## 2023-12-26 ENCOUNTER — Encounter

## 2023-12-26 DIAGNOSIS — I480 Paroxysmal atrial fibrillation: Secondary | ICD-10-CM | POA: Diagnosis not present

## 2023-12-26 LAB — CUP PACEART REMOTE DEVICE CHECK
Date Time Interrogation Session: 20251022232816
Implantable Pulse Generator Implant Date: 20240708

## 2023-12-28 NOTE — Progress Notes (Signed)
 Remote Loop Recorder Transmission

## 2023-12-30 ENCOUNTER — Encounter (HOSPITAL_COMMUNITY)

## 2023-12-31 DIAGNOSIS — L409 Psoriasis, unspecified: Secondary | ICD-10-CM | POA: Diagnosis not present

## 2023-12-31 DIAGNOSIS — M1991 Primary osteoarthritis, unspecified site: Secondary | ICD-10-CM | POA: Diagnosis not present

## 2023-12-31 DIAGNOSIS — Z79899 Other long term (current) drug therapy: Secondary | ICD-10-CM | POA: Diagnosis not present

## 2023-12-31 DIAGNOSIS — M7062 Trochanteric bursitis, left hip: Secondary | ICD-10-CM | POA: Diagnosis not present

## 2023-12-31 DIAGNOSIS — Z6821 Body mass index (BMI) 21.0-21.9, adult: Secondary | ICD-10-CM | POA: Diagnosis not present

## 2023-12-31 DIAGNOSIS — M25562 Pain in left knee: Secondary | ICD-10-CM | POA: Diagnosis not present

## 2023-12-31 DIAGNOSIS — M810 Age-related osteoporosis without current pathological fracture: Secondary | ICD-10-CM | POA: Diagnosis not present

## 2023-12-31 DIAGNOSIS — M069 Rheumatoid arthritis, unspecified: Secondary | ICD-10-CM | POA: Diagnosis not present

## 2023-12-31 DIAGNOSIS — N1832 Chronic kidney disease, stage 3b: Secondary | ICD-10-CM | POA: Diagnosis not present

## 2023-12-31 DIAGNOSIS — S91109A Unspecified open wound of unspecified toe(s) without damage to nail, initial encounter: Secondary | ICD-10-CM | POA: Diagnosis not present

## 2023-12-31 DIAGNOSIS — M5136 Other intervertebral disc degeneration, lumbar region with discogenic back pain only: Secondary | ICD-10-CM | POA: Diagnosis not present

## 2024-01-01 ENCOUNTER — Other Ambulatory Visit (HOSPITAL_COMMUNITY): Payer: Self-pay

## 2024-01-01 ENCOUNTER — Telehealth: Payer: Self-pay

## 2024-01-01 NOTE — Telephone Encounter (Signed)
 PAP: Application for Novolog  and Tresiba  has been submitted to [name], online  Completed provider portion of application has been faxed to [name]

## 2024-01-06 ENCOUNTER — Encounter: Payer: Self-pay | Admitting: Radiology

## 2024-01-08 ENCOUNTER — Ambulatory Visit: Payer: Self-pay | Admitting: Cardiovascular Disease

## 2024-01-15 ENCOUNTER — Telehealth: Payer: Self-pay

## 2024-01-15 NOTE — Telephone Encounter (Signed)
 Patient daughter called to speak with Dr.Van Dam. Informed her that he is currently out of the office. [name] wants to know if Dr.Van Dam thinks its ok for patient to go back on Orencia  due to patient having more flares up with rheumatoid arthritis.    [name] call back is [phone]

## 2024-01-21 NOTE — Telephone Encounter (Signed)
 Patient daughter [name] aware.   Shelby Walker Shelby Walker Letters, CMA

## 2024-01-25 ENCOUNTER — Inpatient Hospital Stay (HOSPITAL_COMMUNITY)
Admission: EM | Admit: 2024-01-25 | Discharge: 2024-02-03 | DRG: 228 | Disposition: E | Attending: Internal Medicine | Admitting: Internal Medicine

## 2024-01-25 ENCOUNTER — Encounter

## 2024-01-25 DIAGNOSIS — Z888 Allergy status to other drugs, medicaments and biological substances status: Secondary | ICD-10-CM

## 2024-01-25 DIAGNOSIS — J9601 Acute respiratory failure with hypoxia: Secondary | ICD-10-CM | POA: Diagnosis not present

## 2024-01-25 DIAGNOSIS — E872 Acidosis, unspecified: Secondary | ICD-10-CM | POA: Diagnosis present

## 2024-01-25 DIAGNOSIS — R079 Chest pain, unspecified: Secondary | ICD-10-CM | POA: Diagnosis not present

## 2024-01-25 DIAGNOSIS — Z882 Allergy status to sulfonamides status: Secondary | ICD-10-CM

## 2024-01-25 DIAGNOSIS — R55 Syncope and collapse: Principal | ICD-10-CM

## 2024-01-25 DIAGNOSIS — I462 Cardiac arrest due to underlying cardiac condition: Secondary | ICD-10-CM | POA: Diagnosis not present

## 2024-01-25 DIAGNOSIS — N170 Acute kidney failure with tubular necrosis: Secondary | ICD-10-CM | POA: Diagnosis present

## 2024-01-25 DIAGNOSIS — I480 Paroxysmal atrial fibrillation: Secondary | ICD-10-CM | POA: Diagnosis present

## 2024-01-25 DIAGNOSIS — W19XXXA Unspecified fall, initial encounter: Secondary | ICD-10-CM | POA: Diagnosis not present

## 2024-01-25 DIAGNOSIS — N1832 Chronic kidney disease, stage 3b: Secondary | ICD-10-CM | POA: Diagnosis present

## 2024-01-25 DIAGNOSIS — I3139 Other pericardial effusion (noninflammatory): Secondary | ICD-10-CM | POA: Diagnosis not present

## 2024-01-25 DIAGNOSIS — S0990XA Unspecified injury of head, initial encounter: Secondary | ICD-10-CM | POA: Diagnosis not present

## 2024-01-25 DIAGNOSIS — I2510 Atherosclerotic heart disease of native coronary artery without angina pectoris: Secondary | ICD-10-CM | POA: Diagnosis present

## 2024-01-25 DIAGNOSIS — Z79899 Other long term (current) drug therapy: Secondary | ICD-10-CM

## 2024-01-25 DIAGNOSIS — R6521 Severe sepsis with septic shock: Secondary | ICD-10-CM | POA: Diagnosis not present

## 2024-01-25 DIAGNOSIS — I4719 Other supraventricular tachycardia: Secondary | ICD-10-CM | POA: Diagnosis present

## 2024-01-25 DIAGNOSIS — A419 Sepsis, unspecified organism: Secondary | ICD-10-CM | POA: Diagnosis not present

## 2024-01-25 DIAGNOSIS — R42 Dizziness and giddiness: Secondary | ICD-10-CM | POA: Diagnosis not present

## 2024-01-25 DIAGNOSIS — Z8249 Family history of ischemic heart disease and other diseases of the circulatory system: Secondary | ICD-10-CM

## 2024-01-25 DIAGNOSIS — I5033 Acute on chronic diastolic (congestive) heart failure: Secondary | ICD-10-CM | POA: Diagnosis present

## 2024-01-25 DIAGNOSIS — R110 Nausea: Secondary | ICD-10-CM | POA: Diagnosis not present

## 2024-01-25 DIAGNOSIS — I10 Essential (primary) hypertension: Secondary | ICD-10-CM | POA: Diagnosis not present

## 2024-01-25 DIAGNOSIS — R570 Cardiogenic shock: Secondary | ICD-10-CM | POA: Diagnosis not present

## 2024-01-25 DIAGNOSIS — Z8619 Personal history of other infectious and parasitic diseases: Secondary | ICD-10-CM

## 2024-01-25 DIAGNOSIS — M069 Rheumatoid arthritis, unspecified: Secondary | ICD-10-CM | POA: Diagnosis present

## 2024-01-25 DIAGNOSIS — N390 Urinary tract infection, site not specified: Secondary | ICD-10-CM | POA: Diagnosis present

## 2024-01-25 DIAGNOSIS — Z87442 Personal history of urinary calculi: Secondary | ICD-10-CM

## 2024-01-25 DIAGNOSIS — K219 Gastro-esophageal reflux disease without esophagitis: Secondary | ICD-10-CM | POA: Diagnosis present

## 2024-01-25 DIAGNOSIS — Z66 Do not resuscitate: Secondary | ICD-10-CM | POA: Diagnosis present

## 2024-01-25 DIAGNOSIS — Z79631 Long term (current) use of antimetabolite agent: Secondary | ICD-10-CM

## 2024-01-25 DIAGNOSIS — Z7952 Long term (current) use of systemic steroids: Secondary | ICD-10-CM

## 2024-01-25 DIAGNOSIS — M810 Age-related osteoporosis without current pathological fracture: Secondary | ICD-10-CM | POA: Diagnosis present

## 2024-01-25 DIAGNOSIS — I340 Nonrheumatic mitral (valve) insufficiency: Secondary | ICD-10-CM | POA: Diagnosis present

## 2024-01-25 DIAGNOSIS — I314 Cardiac tamponade: Secondary | ICD-10-CM | POA: Diagnosis not present

## 2024-01-25 DIAGNOSIS — Z794 Long term (current) use of insulin: Secondary | ICD-10-CM

## 2024-01-25 DIAGNOSIS — I9751 Accidental puncture and laceration of a circulatory system organ or structure during a circulatory system procedure: Secondary | ICD-10-CM | POA: Diagnosis not present

## 2024-01-25 DIAGNOSIS — Z823 Family history of stroke: Secondary | ICD-10-CM

## 2024-01-25 DIAGNOSIS — I442 Atrioventricular block, complete: Principal | ICD-10-CM | POA: Diagnosis present

## 2024-01-25 DIAGNOSIS — J690 Pneumonitis due to inhalation of food and vomit: Secondary | ICD-10-CM | POA: Diagnosis not present

## 2024-01-25 DIAGNOSIS — I130 Hypertensive heart and chronic kidney disease with heart failure and stage 1 through stage 4 chronic kidney disease, or unspecified chronic kidney disease: Secondary | ICD-10-CM | POA: Diagnosis present

## 2024-01-25 DIAGNOSIS — E785 Hyperlipidemia, unspecified: Secondary | ICD-10-CM | POA: Diagnosis present

## 2024-01-25 DIAGNOSIS — Z833 Family history of diabetes mellitus: Secondary | ICD-10-CM

## 2024-01-25 DIAGNOSIS — R54 Age-related physical debility: Secondary | ICD-10-CM | POA: Diagnosis present

## 2024-01-25 DIAGNOSIS — Z006 Encounter for examination for normal comparison and control in clinical research program: Secondary | ICD-10-CM

## 2024-01-25 DIAGNOSIS — E1122 Type 2 diabetes mellitus with diabetic chronic kidney disease: Secondary | ICD-10-CM | POA: Diagnosis present

## 2024-01-26 ENCOUNTER — Other Ambulatory Visit: Payer: Self-pay

## 2024-01-26 ENCOUNTER — Encounter (HOSPITAL_COMMUNITY): Admission: EM | Disposition: E | Payer: Self-pay | Source: Home / Self Care | Attending: Internal Medicine

## 2024-01-26 ENCOUNTER — Emergency Department (HOSPITAL_COMMUNITY)

## 2024-01-26 ENCOUNTER — Encounter (HOSPITAL_COMMUNITY): Payer: Self-pay | Admitting: *Deleted

## 2024-01-26 ENCOUNTER — Inpatient Hospital Stay (HOSPITAL_COMMUNITY)

## 2024-01-26 ENCOUNTER — Ambulatory Visit

## 2024-01-26 DIAGNOSIS — I442 Atrioventricular block, complete: Secondary | ICD-10-CM | POA: Diagnosis not present

## 2024-01-26 DIAGNOSIS — I2510 Atherosclerotic heart disease of native coronary artery without angina pectoris: Secondary | ICD-10-CM | POA: Diagnosis not present

## 2024-01-26 DIAGNOSIS — J690 Pneumonitis due to inhalation of food and vomit: Secondary | ICD-10-CM | POA: Diagnosis not present

## 2024-01-26 DIAGNOSIS — R55 Syncope and collapse: Secondary | ICD-10-CM | POA: Diagnosis present

## 2024-01-26 DIAGNOSIS — I130 Hypertensive heart and chronic kidney disease with heart failure and stage 1 through stage 4 chronic kidney disease, or unspecified chronic kidney disease: Secondary | ICD-10-CM | POA: Diagnosis present

## 2024-01-26 DIAGNOSIS — I5030 Unspecified diastolic (congestive) heart failure: Secondary | ICD-10-CM | POA: Diagnosis not present

## 2024-01-26 DIAGNOSIS — R570 Cardiogenic shock: Secondary | ICD-10-CM | POA: Diagnosis not present

## 2024-01-26 DIAGNOSIS — W19XXXA Unspecified fall, initial encounter: Secondary | ICD-10-CM | POA: Diagnosis present

## 2024-01-26 DIAGNOSIS — E785 Hyperlipidemia, unspecified: Secondary | ICD-10-CM | POA: Diagnosis present

## 2024-01-26 DIAGNOSIS — I314 Cardiac tamponade: Secondary | ICD-10-CM | POA: Diagnosis not present

## 2024-01-26 DIAGNOSIS — N170 Acute kidney failure with tubular necrosis: Secondary | ICD-10-CM | POA: Diagnosis present

## 2024-01-26 DIAGNOSIS — Z043 Encounter for examination and observation following other accident: Secondary | ICD-10-CM | POA: Diagnosis not present

## 2024-01-26 DIAGNOSIS — Z006 Encounter for examination for normal comparison and control in clinical research program: Secondary | ICD-10-CM | POA: Diagnosis not present

## 2024-01-26 DIAGNOSIS — R0902 Hypoxemia: Secondary | ICD-10-CM | POA: Diagnosis not present

## 2024-01-26 DIAGNOSIS — I700 Atherosclerosis of aorta: Secondary | ICD-10-CM | POA: Diagnosis not present

## 2024-01-26 DIAGNOSIS — S0993XA Unspecified injury of face, initial encounter: Secondary | ICD-10-CM | POA: Diagnosis not present

## 2024-01-26 DIAGNOSIS — S0003XA Contusion of scalp, initial encounter: Secondary | ICD-10-CM | POA: Diagnosis not present

## 2024-01-26 DIAGNOSIS — A419 Sepsis, unspecified organism: Secondary | ICD-10-CM | POA: Diagnosis not present

## 2024-01-26 DIAGNOSIS — E1122 Type 2 diabetes mellitus with diabetic chronic kidney disease: Secondary | ICD-10-CM | POA: Diagnosis present

## 2024-01-26 DIAGNOSIS — I10 Essential (primary) hypertension: Secondary | ICD-10-CM | POA: Diagnosis not present

## 2024-01-26 DIAGNOSIS — I9751 Accidental puncture and laceration of a circulatory system organ or structure during a circulatory system procedure: Secondary | ICD-10-CM | POA: Diagnosis not present

## 2024-01-26 DIAGNOSIS — I5033 Acute on chronic diastolic (congestive) heart failure: Secondary | ICD-10-CM | POA: Diagnosis present

## 2024-01-26 DIAGNOSIS — E872 Acidosis, unspecified: Secondary | ICD-10-CM | POA: Diagnosis present

## 2024-01-26 DIAGNOSIS — I129 Hypertensive chronic kidney disease with stage 1 through stage 4 chronic kidney disease, or unspecified chronic kidney disease: Secondary | ICD-10-CM | POA: Diagnosis not present

## 2024-01-26 DIAGNOSIS — J90 Pleural effusion, not elsewhere classified: Secondary | ICD-10-CM | POA: Diagnosis not present

## 2024-01-26 DIAGNOSIS — I4430 Unspecified atrioventricular block: Secondary | ICD-10-CM | POA: Diagnosis not present

## 2024-01-26 DIAGNOSIS — K5730 Diverticulosis of large intestine without perforation or abscess without bleeding: Secondary | ICD-10-CM | POA: Diagnosis not present

## 2024-01-26 DIAGNOSIS — N390 Urinary tract infection, site not specified: Secondary | ICD-10-CM | POA: Diagnosis present

## 2024-01-26 DIAGNOSIS — K219 Gastro-esophageal reflux disease without esophagitis: Secondary | ICD-10-CM | POA: Diagnosis not present

## 2024-01-26 DIAGNOSIS — M069 Rheumatoid arthritis, unspecified: Secondary | ICD-10-CM | POA: Diagnosis present

## 2024-01-26 DIAGNOSIS — N179 Acute kidney failure, unspecified: Secondary | ICD-10-CM | POA: Diagnosis not present

## 2024-01-26 DIAGNOSIS — E119 Type 2 diabetes mellitus without complications: Secondary | ICD-10-CM | POA: Diagnosis not present

## 2024-01-26 DIAGNOSIS — I480 Paroxysmal atrial fibrillation: Secondary | ICD-10-CM | POA: Diagnosis present

## 2024-01-26 DIAGNOSIS — I462 Cardiac arrest due to underlying cardiac condition: Secondary | ICD-10-CM | POA: Diagnosis present

## 2024-01-26 DIAGNOSIS — R6521 Severe sepsis with septic shock: Secondary | ICD-10-CM | POA: Diagnosis not present

## 2024-01-26 DIAGNOSIS — D631 Anemia in chronic kidney disease: Secondary | ICD-10-CM | POA: Diagnosis not present

## 2024-01-26 DIAGNOSIS — M4312 Spondylolisthesis, cervical region: Secondary | ICD-10-CM | POA: Diagnosis not present

## 2024-01-26 DIAGNOSIS — J9601 Acute respiratory failure with hypoxia: Secondary | ICD-10-CM | POA: Diagnosis not present

## 2024-01-26 DIAGNOSIS — I3139 Other pericardial effusion (noninflammatory): Secondary | ICD-10-CM | POA: Diagnosis not present

## 2024-01-26 DIAGNOSIS — R911 Solitary pulmonary nodule: Secondary | ICD-10-CM | POA: Diagnosis not present

## 2024-01-26 DIAGNOSIS — I340 Nonrheumatic mitral (valve) insufficiency: Secondary | ICD-10-CM | POA: Diagnosis present

## 2024-01-26 DIAGNOSIS — I495 Sick sinus syndrome: Secondary | ICD-10-CM | POA: Diagnosis not present

## 2024-01-26 DIAGNOSIS — S199XXA Unspecified injury of neck, initial encounter: Secondary | ICD-10-CM | POA: Diagnosis not present

## 2024-01-26 DIAGNOSIS — Z66 Do not resuscitate: Secondary | ICD-10-CM | POA: Diagnosis present

## 2024-01-26 DIAGNOSIS — N1832 Chronic kidney disease, stage 3b: Secondary | ICD-10-CM | POA: Diagnosis present

## 2024-01-26 DIAGNOSIS — M50221 Other cervical disc displacement at C4-C5 level: Secondary | ICD-10-CM | POA: Diagnosis not present

## 2024-01-26 DIAGNOSIS — Z794 Long term (current) use of insulin: Secondary | ICD-10-CM | POA: Diagnosis not present

## 2024-01-26 DIAGNOSIS — R0989 Other specified symptoms and signs involving the circulatory and respiratory systems: Secondary | ICD-10-CM | POA: Diagnosis not present

## 2024-01-26 DIAGNOSIS — J9811 Atelectasis: Secondary | ICD-10-CM | POA: Diagnosis not present

## 2024-01-26 HISTORY — PX: TEMPORARY PACEMAKER: CATH118268

## 2024-01-26 LAB — CBC
HCT: 26 % — ABNORMAL LOW (ref 36.0–46.0)
Hemoglobin: 8 g/dL — ABNORMAL LOW (ref 12.0–15.0)
MCH: 31.6 pg (ref 26.0–34.0)
MCHC: 30.8 g/dL (ref 30.0–36.0)
MCV: 102.8 fL — ABNORMAL HIGH (ref 80.0–100.0)
Platelets: 126 K/uL — ABNORMAL LOW (ref 150–400)
RBC: 2.53 MIL/uL — ABNORMAL LOW (ref 3.87–5.11)
RDW: 16.8 % — ABNORMAL HIGH (ref 11.5–15.5)
WBC: 10.4 K/uL (ref 4.0–10.5)
nRBC: 0 % (ref 0.0–0.2)

## 2024-01-26 LAB — COMPREHENSIVE METABOLIC PANEL WITH GFR
ALT: 24 U/L (ref 0–44)
AST: 31 U/L (ref 15–41)
Albumin: 3 g/dL — ABNORMAL LOW (ref 3.5–5.0)
Alkaline Phosphatase: 52 U/L (ref 38–126)
Anion gap: 10 (ref 5–15)
BUN: 36 mg/dL — ABNORMAL HIGH (ref 8–23)
CO2: 27 mmol/L (ref 22–32)
Calcium: 8.3 mg/dL — ABNORMAL LOW (ref 8.9–10.3)
Chloride: 104 mmol/L (ref 98–111)
Creatinine, Ser: 1.39 mg/dL — ABNORMAL HIGH (ref 0.44–1.00)
GFR, Estimated: 36 mL/min — ABNORMAL LOW (ref >60)
Glucose, Bld: 234 mg/dL — ABNORMAL HIGH (ref 70–99)
Potassium: 4.5 mmol/L (ref 3.5–5.1)
Sodium: 141 mmol/L (ref 135–145)
Total Bilirubin: 0.9 mg/dL (ref 0.0–1.2)
Total Protein: 5.8 g/dL — ABNORMAL LOW (ref 6.5–8.1)

## 2024-01-26 LAB — CBC WITH DIFFERENTIAL/PLATELET
Abs Immature Granulocytes: 0.08 K/uL — ABNORMAL HIGH (ref 0.00–0.07)
Basophils Absolute: 0.1 K/uL (ref 0.0–0.1)
Basophils Relative: 1 %
Eosinophils Absolute: 0.1 K/uL (ref 0.0–0.5)
Eosinophils Relative: 1 %
HCT: 30.2 % — ABNORMAL LOW (ref 36.0–46.0)
Hemoglobin: 9.5 g/dL — ABNORMAL LOW (ref 12.0–15.0)
Immature Granulocytes: 1 %
Lymphocytes Relative: 13 %
Lymphs Abs: 1.3 K/uL (ref 0.7–4.0)
MCH: 31.8 pg (ref 26.0–34.0)
MCHC: 31.5 g/dL (ref 30.0–36.0)
MCV: 101 fL — ABNORMAL HIGH (ref 80.0–100.0)
Monocytes Absolute: 0.6 K/uL (ref 0.1–1.0)
Monocytes Relative: 6 %
Neutro Abs: 8.1 K/uL — ABNORMAL HIGH (ref 1.7–7.7)
Neutrophils Relative %: 78 %
Platelets: 173 K/uL (ref 150–400)
RBC: 2.99 MIL/uL — ABNORMAL LOW (ref 3.87–5.11)
RDW: 16.1 % — ABNORMAL HIGH (ref 11.5–15.5)
WBC: 10.2 K/uL (ref 4.0–10.5)
nRBC: 0 % (ref 0.0–0.2)

## 2024-01-26 LAB — BASIC METABOLIC PANEL WITH GFR
Anion gap: 8 (ref 5–15)
BUN: 41 mg/dL — ABNORMAL HIGH (ref 8–23)
CO2: 26 mmol/L (ref 22–32)
Calcium: 7.2 mg/dL — ABNORMAL LOW (ref 8.9–10.3)
Chloride: 105 mmol/L (ref 98–111)
Creatinine, Ser: 2.29 mg/dL — ABNORMAL HIGH (ref 0.44–1.00)
GFR, Estimated: 20 mL/min — ABNORMAL LOW (ref >60)
Glucose, Bld: 198 mg/dL — ABNORMAL HIGH (ref 70–99)
Potassium: 4.9 mmol/L (ref 3.5–5.1)
Sodium: 139 mmol/L (ref 135–145)

## 2024-01-26 LAB — CBG MONITORING, ED: Glucose-Capillary: 265 mg/dL — ABNORMAL HIGH (ref 70–99)

## 2024-01-26 LAB — HEMOGLOBIN A1C
Hgb A1c MFr Bld: 9.1 % — ABNORMAL HIGH (ref 4.8–5.6)
Mean Plasma Glucose: 214.47 mg/dL

## 2024-01-26 LAB — MRSA NEXT GEN BY PCR, NASAL: MRSA by PCR Next Gen: NOT DETECTED

## 2024-01-26 LAB — ECHOCARDIOGRAM COMPLETE
Area-P 1/2: 2.53 cm2
Est EF: >75
MV VTI: 1.45 cm2
S' Lateral: 2.2 cm
Weight: 1858.92 [oz_av]

## 2024-01-26 LAB — TROPONIN I (HIGH SENSITIVITY)
Troponin I (High Sensitivity): 17 ng/L (ref <18)
Troponin I (High Sensitivity): 19 ng/L — ABNORMAL HIGH (ref <18)

## 2024-01-26 LAB — GLUCOSE, CAPILLARY
Glucose-Capillary: 243 mg/dL — ABNORMAL HIGH (ref 70–99)
Glucose-Capillary: 191 mg/dL — ABNORMAL HIGH (ref 70–99)
Glucose-Capillary: 144 mg/dL — ABNORMAL HIGH (ref 70–99)
Glucose-Capillary: 223 mg/dL — ABNORMAL HIGH (ref 70–99)
Glucose-Capillary: 228 mg/dL — ABNORMAL HIGH (ref 70–99)

## 2024-01-26 LAB — CG4 I-STAT (LACTIC ACID): Lactic Acid, Venous: 1.8 mmol/L (ref 0.5–1.9)

## 2024-01-26 SURGERY — TEMPORARY PACEMAKER
Anesthesia: LOCAL

## 2024-01-26 MED ORDER — SODIUM CHLORIDE 0.9% FLUSH
3.0000 mL | Freq: Two times a day (BID) | INTRAVENOUS | Status: DC
  Administered 2024-01-26 – 2024-01-28 (×6): 3 mL via INTRAVENOUS

## 2024-01-26 MED ORDER — SODIUM CHLORIDE 0.9% FLUSH: 3.0000 mL | INTRAVENOUS | Status: DC | PRN

## 2024-01-26 MED ORDER — ATORVASTATIN CALCIUM 10 MG PO TABS
20.0000 mg | ORAL_TABLET | Freq: Every day | ORAL | Status: DC
  Administered 2024-01-26 – 2024-01-29 (×4): 20 mg via ORAL
  Filled 2024-01-26 (×4): qty 2

## 2024-01-26 MED ORDER — PERFLUTREN LIPID MICROSPHERE
1.0000 mL | INTRAVENOUS | Status: AC | PRN
  Administered 2024-01-26: 4 mL via INTRAVENOUS

## 2024-01-26 MED ORDER — EPINEPHRINE HCL 5 MG/250ML IV SOLN IN NS
0.5000 ug/min | INTRAVENOUS | Status: DC
  Administered 2024-01-26: 0.5 ug/min via INTRAVENOUS
  Filled 2024-01-26: qty 250

## 2024-01-26 MED ORDER — ONDANSETRON HCL 4 MG/2ML IJ SOLN: 4.0000 mg | Freq: Four times a day (QID) | INTRAMUSCULAR | Status: DC | PRN

## 2024-01-26 MED ORDER — PREDNISONE 5 MG PO TABS
5.0000 mg | ORAL_TABLET | Freq: Every day | ORAL | Status: DC
  Administered 2024-01-26 – 2024-01-29 (×4): 5 mg via ORAL
  Filled 2024-01-26 (×4): qty 1

## 2024-01-26 MED ORDER — SODIUM CHLORIDE 0.9 % IV SOLN: 250.0000 mL | INTRAVENOUS | Status: DC | PRN

## 2024-01-26 MED ORDER — SODIUM CHLORIDE 0.9 % IV SOLN: INTRAVENOUS | Status: DC

## 2024-01-26 MED ORDER — SODIUM CHLORIDE 0.9 % IV BOLUS
250.0000 mL | Freq: Once | INTRAVENOUS | Status: AC
  Administered 2024-01-26: 250 mL via INTRAVENOUS

## 2024-01-26 MED ORDER — LIDOCAINE HCL (PF) 1 % IJ SOLN
INTRAMUSCULAR | Status: DC | PRN
  Administered 2024-01-26: 5 mL via INTRADERMAL

## 2024-01-26 MED ORDER — FENTANYL CITRATE (PF) 50 MCG/ML IJ SOSY
12.5000 ug | PREFILLED_SYRINGE | Freq: Once | INTRAMUSCULAR | Status: AC
  Administered 2024-01-26: 12.5 ug via INTRAVENOUS
  Filled 2024-01-26: qty 1

## 2024-01-26 MED ORDER — ISOPROTERENOL HCL 0.2 MG/ML IJ SOLN
2.0000 ug/min | INTRAVENOUS | Status: DC
  Administered 2024-01-26: 2 ug/min via INTRAVENOUS
  Filled 2024-01-26 (×2): qty 5

## 2024-01-26 MED ORDER — LABETALOL HCL 5 MG/ML IV SOLN
10.0000 mg | INTRAVENOUS | Status: DC | PRN
Start: 2024-01-26 — End: 2024-01-26

## 2024-01-26 MED ORDER — HYDRALAZINE HCL 20 MG/ML IJ SOLN: 10.0000 mg | INTRAMUSCULAR | Status: DC | PRN

## 2024-01-26 MED ORDER — ORAL CARE MOUTH RINSE: 15.0000 mL | OROMUCOSAL | Status: DC | PRN

## 2024-01-26 MED ORDER — PANTOPRAZOLE SODIUM 40 MG PO TBEC
40.0000 mg | DELAYED_RELEASE_TABLET | Freq: Every day | ORAL | Status: DC
  Administered 2024-01-26 – 2024-01-29 (×4): 40 mg via ORAL
  Filled 2024-01-26 (×4): qty 1

## 2024-01-26 MED ORDER — ENSURE PLUS HIGH PROTEIN PO LIQD
237.0000 mL | Freq: Two times a day (BID) | ORAL | Status: DC
  Administered 2024-01-27 – 2024-01-28 (×3): 237 mL via ORAL

## 2024-01-26 MED ORDER — OXYCODONE HCL 5 MG PO TABS
2.5000 mg | ORAL_TABLET | Freq: Four times a day (QID) | ORAL | Status: DC | PRN
  Administered 2024-01-26 – 2024-01-27 (×3): 2.5 mg via ORAL
  Filled 2024-01-26 (×3): qty 1

## 2024-01-26 MED ORDER — INSULIN ASPART 100 UNIT/ML IJ SOLN
0.0000 [IU] | Freq: Three times a day (TID) | INTRAMUSCULAR | Status: DC
  Administered 2024-01-26: 3 [IU] via SUBCUTANEOUS
  Administered 2024-01-26: 2 [IU] via SUBCUTANEOUS
  Administered 2024-01-26: 1 [IU] via SUBCUTANEOUS
  Administered 2024-01-27: 7 [IU] via SUBCUTANEOUS
  Filled 2024-01-26: qty 2
  Filled 2024-01-26: qty 1
  Filled 2024-01-26: qty 3
  Filled 2024-01-26: qty 7

## 2024-01-26 MED ORDER — CHLORHEXIDINE GLUCONATE CLOTH 2 % EX PADS
6.0000 | MEDICATED_PAD | Freq: Every day | CUTANEOUS | Status: DC
  Administered 2024-01-26 – 2024-01-29 (×4): 6 via TOPICAL

## 2024-01-26 MED ORDER — AMIODARONE HCL 200 MG PO TABS
100.0000 mg | ORAL_TABLET | Freq: Every day | ORAL | Status: DC
  Administered 2024-01-27 – 2024-01-29 (×3): 100 mg via ORAL
  Filled 2024-01-26 (×3): qty 1

## 2024-01-26 MED ORDER — ACETAMINOPHEN 325 MG PO TABS
650.0000 mg | ORAL_TABLET | ORAL | Status: DC | PRN
  Administered 2024-01-26 – 2024-01-27 (×5): 650 mg via ORAL
  Filled 2024-01-26 (×5): qty 2

## 2024-01-26 MED ORDER — LACTATED RINGERS IV BOLUS
1000.0000 mL | Freq: Once | INTRAVENOUS | Status: AC
  Administered 2024-01-26: 1000 mL via INTRAVENOUS

## 2024-01-26 MED ORDER — HEPARIN (PORCINE) IN NACL 1000-0.9 UT/500ML-% IV SOLN
INTRAVENOUS | Status: DC | PRN
  Administered 2024-01-26: 500 mL

## 2024-01-26 MED ORDER — LIDOCAINE HCL (PF) 1 % IJ SOLN
INTRAMUSCULAR | Status: AC
  Filled 2024-01-26: qty 30

## 2024-01-26 SURGICAL SUPPLY — 6 items
KIT MICROPUNCTURE NIT STIFF (SHEATH) IMPLANT
PACK CARDIAC CATHETERIZATION (CUSTOM PROCEDURE TRAY) IMPLANT
SHEATH PINNACLE 6F 10CM (SHEATH) IMPLANT
SHEATH PROBE COVER 6X72 (BAG) IMPLANT
SHIELD CATHGARD ARROW (MISCELLANEOUS) IMPLANT
WIRE PACING TEMP ST TIP 5 (CATHETERS) IMPLANT

## 2024-01-26 NOTE — Plan of Care (Signed)
  Problem: Education: Goal: Knowledge of General Education information will improve Description: Including pain rating scale, medication(s)/side effects and non-pharmacologic comfort measures Outcome: Progressing   Problem: Elimination: Goal: Will not experience complications related to bowel motility Outcome: Progressing Goal: Will not experience complications related to urinary retention Outcome: Progressing   Problem: Pain Managment: Goal: General experience of comfort will improve and/or be controlled Outcome: Progressing   Problem: Skin Integrity: Goal: Risk for impaired skin integrity will decrease Outcome: Progressing

## 2024-01-26 NOTE — ED Notes (Signed)
 CCMD called for cardiac monitoring.

## 2024-01-26 NOTE — ED Provider Notes (Signed)
 [place] EMERGENCY DEPARTMENT AT [facility] Provider Note   CSN: [id number] Arrival date & time: 01/25/24  2350     Patient presents with: Fall and Near Syncope   Shelby Walker is a 89 y.o. female.   Patient presents to the emergency department for evaluation after syncopal episode.  Patient had a syncopal episode tonight at home.  She fell, hit her left chest on a chair and then her head on the ground.  Complaining of left-sided chest pain and headache.  EMS report that she may have had a sinus pause while she was on their monitor.  Patient denies any chest pain prior to falling and injuring herself.  She does, however, report a fall 2 days ago where she hit the right side of her forehead and face.       Prior to Admission medications   Medication Sig Start Date End Date Taking? Authorizing Provider  acetaminophen  (TYLENOL ) 325 MG tablet Take 650 mg by mouth every 6 (six) hours as needed for moderate pain (pain score 4-6).    [provider]  amiodarone  (PACERONE ) 200 MG tablet Take 0.5 tablets (100 mg total) by mouth daily. 06/18/23   Fernande Elspeth BROCKS, MD  atorvastatin  (LIPITOR) 20 MG tablet Take 1 tablet by mouth once daily 08/14/23   Klein, Steven C, MD  Calcium  Carbonate-Vitamin D 600-400 MG-UNIT tablet Take 1 tablet by mouth daily at 12 noon.     [provider]  denosumab  (PROLIA ) 60 MG/ML SOSY injection Inject 60 mg into the skin every 6 (six) months.    [provider]  diclofenac  Sodium (VOLTAREN ) 1 % GEL Apply 4 g topically 4 (four) times daily. 02/15/22   Setzer, Sandra J, PA-C  esomeprazole  (NEXIUM ) 20 MG capsule Take 20 mg by mouth daily at 12 noon.    [provider]  folic acid  (FOLVITE ) 1 MG tablet Take 1 mg by mouth daily.    [provider]  insulin  aspart (NOVOLOG ) 100 UNIT/ML FlexPen Inject 2 Units into the skin 3 (three) times daily with meals. Sliding scale 02/15/22   Setzer, Nena PARAS, PA-C  Insulin  Degludec  (TRESIBA ) 100 UNIT/ML SOLN Inject 4 Units into the skin at bedtime. Patient taking differently: Inject 6 Units into the skin at bedtime. 02/15/22   Setzer, Sandra J, PA-C  iron polysaccharides (NIFEREX) 150 MG capsule Take 150 mg by mouth daily.    [provider]  lidocaine  (LIDODERM ) 5 % Place 1 patch onto the skin daily. Remove & Discard patch within 12 hours or as directed by MD 04/07/23   Trixie Nilda HERO, MD  methocarbamol  (ROBAXIN ) 500 MG tablet Take 1-2 tablets (500-1,000 mg total) by mouth every 6 (six) hours as needed for muscle spasms. 04/25/23   McKenzie, Kayla J, PA-C  methotrexate 50 MG/2ML injection Inject 0.4 mLs into the muscle once a week. 07/26/22   [provider]  mirtazapine  (REMERON ) 7.5 MG tablet Take 7.5 mg by mouth at bedtime.    [provider]  Multiple Vitamins-Minerals (CENTRUM SILVER  50+WOMEN PO) Take 1 tablet by mouth daily.    [provider]  nitroGLYCERIN  (NITRODUR - DOSED IN MG/24 HR) 0.2 mg/hr patch Place 1 patch (0.2 mg total) onto the skin daily. 04/15/23   Harden Jerona GAILS, MD  nitroGLYCERIN  (NITROSTAT ) 0.4 MG SL tablet Dissolve 1 tablet under the tongue every 5 minutes as needed for chest pain. Max of 3 doses, then 911. 09/05/23   Nahser, Aleene PARAS,  MD  predniSONE  (DELTASONE ) 20 MG tablet Take 1 tablet (20 mg total) by mouth daily with breakfast. 12/04/23   Valdemar Rocky SAUNDERS, NP  predniSONE  (DELTASONE ) 5 MG tablet Take 1 tablet (5 mg total) by mouth daily with breakfast. 02/15/22   Setzer, Nena PARAS, PA-C  pregabalin (LYRICA) 50 MG capsule Take 50 mg by mouth in the morning.    [provider]  pregabalin (LYRICA) 75 MG capsule Take 75 mg by mouth at bedtime.    [provider]  protein supplement shake (PREMIER PROTEIN) LIQD Take 2 oz by mouth 2 (two) times daily.    [provider]  pyrithione zinc  (SELSUN BLUE DRY SCALP) 1 % shampoo Apply 1 application topically once a week. 04/15/15   [provider]   RESTASIS  0.05 % ophthalmic emulsion Place 1 drop into both eyes 2 (two) times daily. 02/13/19   [provider]    Allergies: Cholestyramine , Compazine [prochlorperazine edisylate], Celecoxib, Sulfonamide derivatives, Hydrocodone , Infliximab, Leflunomide, Prochlorperazine, Rosuvastatin, and Sulfa antibiotics    Review of Systems  Updated Vital Signs BP (!) 145/61   Pulse 79   Temp 98 F (36.7 C)   Resp 13   SpO2 99%   Physical Exam Vitals and nursing note reviewed.  Constitutional:      General: She is not in acute distress.    Appearance: She is well-developed.  HENT:     Head: Normocephalic. Abrasion and contusion present.      Mouth/Throat:     Mouth: Mucous membranes are moist.  Eyes:     General: Vision grossly intact. Gaze aligned appropriately.     Extraocular Movements: Extraocular movements intact.     Conjunctiva/sclera: Conjunctivae normal.  Cardiovascular:     Rate and Rhythm: Normal rate and regular rhythm.     Pulses: Normal pulses.     Heart sounds: Normal heart sounds, S1 normal and S2 normal. No murmur heard.    No friction rub. No gallop.  Pulmonary:     Effort: Pulmonary effort is normal. No respiratory distress.     Breath sounds: Normal breath sounds.  Chest:    Abdominal:     General: Bowel sounds are normal.     Palpations: Abdomen is soft.     Tenderness: There is no abdominal tenderness. There is no guarding or rebound.     Hernia: No hernia is present.  Musculoskeletal:        General: No swelling.     Cervical back: Full passive range of motion without pain, normal range of motion and neck supple. No spinous process tenderness or muscular tenderness. Normal range of motion.     Right lower leg: No edema.     Left lower leg: No edema.  Skin:    General: Skin is warm and dry.     Capillary Refill: Capillary refill takes less than 2 seconds.     Findings: No ecchymosis, erythema, rash or wound.  Neurological:     General: No  focal deficit present.     Mental Status: She is alert and oriented to person, place, and time.     GCS: GCS eye subscore is 4. GCS verbal subscore is 5. GCS motor subscore is 6.     Cranial Nerves: Cranial nerves 2-12 are intact.     Sensory: Sensation is intact.     Motor: Motor function is intact.     Coordination: Coordination is intact.  Psychiatric:        Attention  and Perception: Attention normal.        Mood and Affect: Mood normal.        Speech: Speech normal.        Behavior: Behavior normal.     (all labs ordered are listed, but only abnormal results are displayed) Labs Reviewed  CBC WITH DIFFERENTIAL/PLATELET - Abnormal; Notable for the following components:      Result Value   RBC 2.99 (*)    Hemoglobin 9.5 (*)    HCT 30.2 (*)    MCV 101.0 (*)    RDW 16.1 (*)    Neutro Abs 8.1 (*)    Abs Immature Granulocytes 0.08 (*)    All other components within normal limits  COMPREHENSIVE METABOLIC PANEL WITH GFR - Abnormal; Notable for the following components:   Glucose, Bld 234 (*)    BUN 36 (*)    Creatinine, Ser 1.39 (*)    Calcium  8.3 (*)    Total Protein 5.8 (*)    Albumin  3.0 (*)    GFR, Estimated 36 (*)    All other components within normal limits  CBG MONITORING, ED - Abnormal; Notable for the following components:   Glucose-Capillary 265 (*)    All other components within normal limits  URINALYSIS, ROUTINE W REFLEX MICROSCOPIC  TROPONIN I (HIGH SENSITIVITY)  TROPONIN I (HIGH SENSITIVITY)    EKG: EKG Interpretation Date/Time:  Sunday January 26 2024 00:02:53 EST Ventricular Rate:  75 PR Interval:  55 QRS Duration:  95 QT Interval:  407 QTC Calculation: 455 R Axis:   64  Text Interpretation: Sinus rhythm Short PR interval No acute changes Confirmed by Haze Lonni PARAS [phone]) on 01/26/2024 12:07:11 AM  Radiology: CT CHEST ABDOMEN PELVIS WO CONTRAST Result Date: 01/26/2024 EXAM: CT CHEST, ABDOMEN AND PELVIS WITHOUT CONTRAST 01/26/2024  02:20:33 AM TECHNIQUE: CT of the chest, abdomen and pelvis was performed without the administration of intravenous contrast. Multiplanar reformatted images are provided for review. Automated exposure control, iterative reconstruction, and/or weight based adjustment of the mA/kV was utilized to reduce the radiation dose to as low as reasonably achievable. COMPARISON: 04/03/2023 CLINICAL HISTORY: Recent syncope. FINDINGS: CHEST: MEDIASTINUM AND LYMPH NODES: Thoracic aorta and branches show atherosclerotic calcification. No aneurysmal dilatation is seen. Lack of IV contrast precludes evaluation for dissection. The heart is not significantly enlarged. Pulmonary artery is not enlarged. Coronary calcifications are seen. The esophagus is within normal limits. No mediastinal, hilar or axillary lymphadenopathy. The thoracic inlet is within normal limits. LUNGS AND PLEURA: A tiny 3 mm nodule is noted in the left upper lobe. This is stable from a prior exam from 2023 and felt to be benign in etiology. No other significant pulmonary nodule is noted. Minimal left basilar atelectasis is seen. No focal effusion or pneumothorax is seen. ABDOMEN AND PELVIS: LIVER: The liver shows evidence of pneumobilia likely related to a prior procedure. No ductal dilatation is seen. GALLBLADDER AND BILE DUCTS: The gallbladder has been surgically removed. No biliary ductal dilatation. SPLEEN: The spleen is within normal limits. PANCREAS: The pancreas is within normal limits. ADRENAL GLANDS: Adrenal glands are unremarkable. KIDNEYS, URETERS AND BLADDER: Cystic lesions are again seen in the kidneys bilaterally, stable dating back to 2023 and felt to be benign in etiology. No further follow-up is recommended. No renal calculi or obstructive changes are seen. The bladder is well distended. GI AND BOWEL: Stomach and small bowel are unremarkable. No obstructive or inflammatory changes of the colon are seen. Significant sigmoid diverticulosis is  noted  without diverticulitis. Scattered fecal material is noted throughout the colon without obstructive change. The appendix is within normal limits. REPRODUCTIVE ORGANS: The uterus and adnexa are within normal limits. PERITONEUM AND RETROPERITONEUM: No free fluid is seen. No free air. VASCULATURE: Abdominal aortic calcifications are seen without aneurysmal dilatation. ABDOMINAL AND PELVIS LYMPH NODES: No lymphadenopathy is noted. BONES AND SOFT TISSUES: No acute rib abnormality is noted. Degenerative changes of the thoracic spine are seen. Changes of prior vertebral augmentation at L1 are noted. Degenerative changes of the lumbar spine are seen. No focal soft tissue abnormality. IMPRESSION: 1. No acute findings in the chest, abdomen, and pelvis related to syncope. 2. Stable 3 mm left upper lobe pulmonary nodule, benign, no follow-up recommended per prior stability. 3. Significant sigmoid diverticulosis without diverticulitis. Electronically signed by: Oneil Devonshire MD 01/26/2024 02:31 AM EST RP Workstation: [id number]   DG Chest Port 1 View Result Date: 01/26/2024 EXAM: 1 VIEW(S) XRAY OF THE CHEST 01/26/2024 12:58:00 AM COMPARISON: 08/08/2022. CLINICAL HISTORY: fall FINDINGS: LINES, TUBES AND DEVICES: Loop recorder is noted on the left. LUNGS AND PLEURA: The lungs are well aerated bilaterally. No focal pulmonary opacity. No pleural effusion. No pneumothorax. HEART AND MEDIASTINUM: Cardiac shadow is stable. Aortic calcifications are again seen. BONES AND SOFT TISSUES: No acute osseous abnormality. Old Right 7th rib fracture is noted posteriorly. IMPRESSION: 1. No acute cardiopulmonary process. Electronically signed by: Oneil Devonshire MD 01/26/2024 01:09 AM EST RP Workstation: [id number]   CT MAXILLOFACIAL WO CONTRAST Result Date: 01/26/2024 EXAM: CT OF THE FACE WITHOUT CONTRAST 01/26/2024 12:26:00 AM TECHNIQUE: CT of the face was performed without the administration of intravenous contrast. Multiplanar reformatted  images are provided for review. Automated exposure control, iterative reconstruction, and/or weight based adjustment of the mA/kV was utilized to reduce the radiation dose to as low as reasonably achievable. COMPARISON: None available. CLINICAL HISTORY: Facial trauma, blunt Facial trauma, blunt FINDINGS: FACIAL BONES: No acute facial fracture. No mandibular dislocation. No suspicious bone lesion. ORBITS: Globes are intact. No acute traumatic injury. No inflammatory change. SINUSES AND MASTOIDS: No acute abnormality. SOFT TISSUES: No acute abnormality. IMPRESSION: 1. No acute facial fracture. Electronically signed by: Franky Stanford MD 01/26/2024 12:57 AM EST RP Workstation: [id number]   CT CERVICAL SPINE WO CONTRAST Result Date: 01/26/2024 EXAM: CT CERVICAL SPINE WITHOUT CONTRAST 01/26/2024 12:26:00 AM TECHNIQUE: CT of the cervical spine was performed without the administration of intravenous contrast. Multiplanar reformatted images are provided for review. Automated exposure control, iterative reconstruction, and/or weight based adjustment of the mA/kV was utilized to reduce the radiation dose to as low as reasonably achievable. COMPARISON: None available. CLINICAL HISTORY: Neck trauma (Age >= 65y) FINDINGS: CERVICAL SPINE: BONES AND ALIGNMENT: Grade 1 anterolisthesis at C3-C4 and grade 1 retrolisthesis at C4-C5. No acute fracture. DEGENERATIVE CHANGES: No significant degenerative changes. SOFT TISSUES: No prevertebral soft tissue swelling. IMPRESSION: 1. No acute abnormality of the cervical spine related to neck trauma. 2. Grade 1 anterolisthesis at C3-4 and grade 1 retrolisthesis at C4-5. Electronically signed by: Franky Stanford MD 01/26/2024 12:56 AM EST RP Workstation: [id number]   CT HEAD WO CONTRAST ( ) Result Date: 01/26/2024 EXAM: CT HEAD WITHOUT CONTRAST 01/26/2024 12:26:00 AM TECHNIQUE: CT of the head was performed without the administration of intravenous contrast. Automated exposure control,  iterative reconstruction, and/or weight based adjustment of the mA/kV was utilized to reduce the radiation dose to as low as reasonably achievable. COMPARISON: 07/22/2018 CLINICAL HISTORY: Head trauma, minor (Age >= 65y) FINDINGS: BRAIN AND VENTRICLES: Slightly  increased size of right parafalcine anterior meningioma measuring 0.9 x 0.9 x 1.1 cm, previously. No acute intracranial hemorrhage. Periventricular white matter changes, likely sequela of chronic small vessel ischemic disease. No evidence of acute infarct. No hydrocephalus. No extra-axial collection. No mass effect or midline shift. ORBITS: Bilateral lens replacements. SINUSES: No acute abnormality. SOFT TISSUES AND SKULL: Left parieto-occipital scalp hematoma. No skull fracture. IMPRESSION: 1. No acute intracranial abnormality related to the head trauma. 2. Left parieto-occipital scalp hematoma. 3. Slightly increased size of right parafalcine anterior meningioma, now measuring 0.9 x 0.9 x 1.1 cm. 4. Periventricular white matter changes, likely sequela of chronic small vessel ischemic disease. Electronically signed by: Franky Stanford MD 01/26/2024 12:53 AM EST RP Workstation: [id number]     CPR  Date/Time: 01/26/2024 12:54 AM  Performed by: Haze Lonni PARAS, MD Authorized by: Haze Lonni PARAS, MD  CPR Procedure Details:      Amount of time prior to administration of ACLS/BLS (minutes):  0   ACLS/BLS initiated by EMS: No     CPR/ACLS performed in the ED: Yes     Duration of CPR (minutes):  1   Outcome: ROSC obtained    CPR performed via ACLS guidelines under my direct supervision.  See RN documentation for details including defibrillator use, medications, doses and timing. Comments:     Patient noted by nurse in the room to become unresponsive, no pulses.  CPR immediately initiated. .Critical Care  Performed by: Haze Lonni PARAS, MD Authorized by: Haze Lonni PARAS, MD   Critical care provider statement:    Critical  care time (minutes):  35   Critical care time was exclusive of:  Separately billable procedures and treating other patients   Critical care was necessary to treat or prevent imminent or life-threatening deterioration of the following conditions:  CNS failure or compromise and circulatory failure   Critical care was time spent personally by me on the following activities:  Discussions with consultants, development of treatment plan with patient or surrogate, ordering and performing treatments and interventions, ordering and review of laboratory studies, ordering and review of radiographic studies, pulse oximetry, re-evaluation of patient's condition, review of old charts, examination of patient, evaluation of patient's response to treatment and obtaining history from patient or surrogate   I assumed direction of critical care for this patient from another provider in my specialty: no      Medications Ordered in the ED  isoproterenol  (ISUPREL ) 1 mg in dextrose  5 % 250 mL (0.004 mg/mL) infusion (15 mcg/min Intravenous Rate/Dose Change 01/26/24 0248)  fentaNYL  (SUBLIMAZE ) injection 12.5 mcg (12.5 mcg Intravenous Given 01/26/24 0259)                                    Medical Decision Making Amount and/or Complexity of Data Reviewed Independent Historian: caregiver and EMS External Data Reviewed: labs, ECG and notes. Labs: ordered. Decision-making details documented in ED Course. Radiology: ordered and independent interpretation performed. Decision-making details documented in ED Course. ECG/medicine tests: ordered and independent interpretation performed. Decision-making details documented in ED Course.  Risk Prescription drug management. Decision regarding hospitalization.   Differential Diagnosis considered includes, but not limited to: Arrhythmia; MI; vasovagal episode; seizure; toxidrome; PE; stroke  Presents to the emergency department for evaluation of syncope.  Patient had an  episode of syncope tonight which caused her to fall to the ground.  She hit the left side of her  chest when she fell, she thinks it might have been a chair.  Patient with a contusion to the back of her head.  Patient also with abrasion on the right side of her face, bruising to the right forehead from a fall that occurred 2 days ago.  EMS reports that they felt that the patient did have a significant sinus pause for them initially.  During transport at arrival to the emergency department patient is in sinus rhythm.  Nurse was at the bedside when the patient suddenly became unresponsive.  She could not find pulses, patient appeared to be in asystole on the monitor.  CPR was initiated.  After 20 to 30 seconds, she regained a cardiac rhythm and pulses.  Cardiology was consulted.  Recommendation was to be admitted to the CCU.  Patient, unfortunately, having multiple episodes of sinus pauses and also is now going in and out of complete heart block.  Recommendation was to initiate isoproterenol .  This was performed and titrated upwards but she continues to have asystole episodes.  Cardiology will take the patient to the Cath Lab for temporary pacemaker.       Final diagnoses:  Cardiac syncope    ED Discharge Orders     None          Haze Lonni PARAS, MD 01/26/24 [phone]

## 2024-01-26 NOTE — ED Triage Notes (Signed)
 Pt arrived with EMS for syncopal episode at home. Reports falling into a chair. Left sided chest pain, tender on palpation, with sob. EMS reports ventricular pause; has loop recorder in, hx of pacemaker in the past that was removed due to infeciton. Hematoma to posterior head. Passed spinal precautions with EMS and unable to tolerate c-collar due to hematoma location.   22g IV to Left AC

## 2024-01-26 NOTE — H&P (Signed)
 Cardiology Admission History and Physical:   Patient ID: Shelby Walker MRN: [id number]; DOB: 07/19/1933   Admission date: 01/25/2024  Primary Care Provider: Nichole Senior, MD [facility] Cardiologist: Aleene Passe, MD (Inactive)  [facility] Electrophysiologist:  Elspeth Sage, MD (Inactive)   Chief Complaint:  syncope   Patient Profile:   Shelby Walker is a 89 y.o. female with sinus arrest s/p PPM (01/2022) c/b MSSA bacteremia and septic shock s/p device removal (01/29/22), nonobstructive CAD who presents with LOC with associated sinus arrest.  History of Present Illness:   Shelby Walker initially had her device placed on 01/05/2022 following sinus arrest with 25 seconds.  Unfortunately her course is complicated by MSSA bacteremia and septic shock for which her device was removed 3 weeks later on 01/29/2022.  Her hospital course was fairly prolonged associated with concern for lumbar periostitis without osteomyelitis.  She did not want another device after her hospitalization then.    In the interim she was hospitalized 08/2022 with gallstone pancreatitis with ascending cholangitis and Enterobacter sepsis requiring laparoscopic cholecystectomy.  She is on chronic steroid therapy secondary to RA.  She has history of atrial tachycardia and paroxysmal atrial fibrillation.  On 09/10/2022 she had a MDT Linq II implanted for ongoing surveillance for pauses.  She had no events recorded on her ILR since implant. Over the last 2 years she had no recurrent symptoms however on Wednesday she had loss of consciousness with head trauma.  She had another episode on Saturday and EMS saw a prolonged sinus pause and route.  She is now s/p RIJ TVP placement for intermittent CHB.   Past Medical History:  Diagnosis Date   Anemia    Bruit    Carotid Doppler showed no significant abnormality     9per patient)   C. difficile colitis 07/2018   with severe sepsis   Chest pain, unspecified    Nuclear, May,  2008, no scar or ischemia   Chronic kidney disease    Diabetes mellitus    Diverticulosis    Dyslipidemia    Dysrhythmia    a-fib   Ejection fraction    EF 55-60%, echo, February, 2011 // Echocardiogram 8/21: EF 60-65, no RWMA, Gr 1 DD, GLS -14%, normal RVSF, mild LAE, trivial MR, mild MS (mean 4 mmHg), RVSP 23.4    GERD (gastroesophageal reflux disease)    History of kidney stones    removed   HLD (hyperlipidemia)    Implantable loop recorder present    Memory loss    Mitral regurgitation 04/21/2009   mild,  echo, February, 2011   Osteoporosis    Palpitations    possible very brief atrial fibrillation on monitor and possible reentrant tachycardia   Pneumonia    PONV (postoperative nausea and vomiting)    Psoriasis    Rheumatoid arthritis (HCC)    Past Surgical History:  Procedure Laterality Date   ABDOMINAL AORTOGRAM W/LOWER EXTREMITY Right 10/19/2020   Procedure: ABDOMINAL AORTOGRAM W/LOWER EXTREMITY;  Surgeon: Darron Deatrice LABOR, MD;  [facility]: [facility];  Service: Cardiovascular;  Laterality: Right;   CHOLECYSTECTOMY N/A 08/09/2022   Procedure: LAPAROSCOPIC CHOLECYSTECTOMY WITH INTRAOPERATIVE CHOLANGIOGRAM;  Surgeon: Lyndel Deward PARAS, MD;  Location: [facility] OR;  Service: General;  Laterality: N/A;   ENDOSCOPIC RETROGRADE CHOLANGIOPANCREATOGRAPHY (ERCP) WITH PROPOFOL  N/A 04/05/2023   Procedure: ENDOSCOPIC RETROGRADE CHOLANGIOPANCREATOGRAPHY (ERCP) WITH PROPOFOL ;  Surgeon: Abran Norleen SAILOR, MD;  Location: [facility];  Service: Gastroenterology;  Laterality: N/A;   FRACTURE SURGERY  KYPHOPLASTY N/A 04/25/2023   Procedure: LUMBAR 1 KYPHOPLASTY;  Surgeon: Beuford Anes, MD;  Location: [facility];  Service: Orthopedics;  Laterality: N/A;   LOOP Recorder placement     LOOP RECORDER REMOVAL N/A 01/05/2022   Procedure: LOOP RECORDER REMOVAL;  Surgeon: Waddell Danelle ORN, MD;  Location: [facility];  Service: Cardiovascular;  Laterality: N/A;   PACEMAKER IMPLANT N/A  01/05/2022   Procedure: [employer] PACEMAKER IMPLANT;  Surgeon: Waddell Danelle ORN, MD;  Location: [facility];  Service: Cardiovascular;  Laterality: N/A;   PPM GENERATOR REMOVAL N/A 01/29/2022   Procedure: PPM GENERATOR REMOVAL;  Surgeon: Waddell Danelle ORN, MD;  [facility]: [facility];  Service: Cardiovascular;  Laterality: N/A;   REMOVAL OF STONES  04/05/2023   Procedure: REMOVAL OF STONES;  Surgeon: Abran Norleen SAILOR, MD;  Location: [facility];  Service: Gastroenterology;;   ANNETT  04/05/2023   Procedure: ANNETT;  Surgeon: Abran Norleen SAILOR, MD;  Location: [facility];  Service: Gastroenterology;;   TEE WITHOUT CARDIOVERSION N/A 01/29/2022   Procedure: TRANSESOPHAGEAL ECHOCARDIOGRAM (TEE);  Surgeon: Barbaraann Darryle Ned, MD;  Location: [facility];  Service: Cardiovascular;  Laterality: N/A;   TRANSMETATARSAL AMPUTATION Right 01/18/2021   Procedure: TRANSMETATARSAL AMPUTATION;  Surgeon: Kit Norleen, MD;  Location: [facility] OR;  Service: Orthopedics;  Laterality: Right;    Medications Prior to Admission: Prior to Admission medications   Medication Sig Start Date End Date Taking? Authorizing Provider  acetaminophen  (TYLENOL ) 325 MG tablet Take 650 mg by mouth every 6 (six) hours as needed for moderate pain (pain score 4-6).   Yes [provider]  amiodarone  (PACERONE ) 200 MG tablet Take 0.5 tablets (100 mg total) by mouth daily. 06/18/23  Yes Fernande Elspeth BROCKS, MD  atorvastatin  (LIPITOR) 20 MG tablet Take 1 tablet by mouth once daily 08/14/23  Yes Klein, Steven C, MD  Calcium  Carbonate-Vitamin D 600-400 MG-UNIT tablet Take 1 tablet by mouth daily at 12 noon.    Yes [provider]  denosumab  (PROLIA ) 60 MG/ML SOSY injection Inject 60 mg into the skin every 6 (six) months.   Yes [provider]  diclofenac  Sodium (VOLTAREN ) 1 % GEL Apply 4 g topically 4 (four) times daily. Patient taking differently: Apply 4 g topically 4 (four) times daily as needed (for  pain). 02/15/22  Yes Setzer, Nena PARAS, PA-C  esomeprazole  (NEXIUM ) 20 MG capsule Take 20 mg by mouth daily.   Yes [provider]  folic acid  (FOLVITE ) 1 MG tablet Take 1 mg by mouth daily.   Yes [provider]  insulin  aspart (NOVOLOG ) 100 UNIT/ML FlexPen Inject 2 Units into the skin 3 (three) times daily with meals. Sliding scale Patient taking differently: Inject 3-5 Units into the skin 3 (three) times daily with meals. Sliding scale 02/15/22  Yes Setzer, Nena PARAS, PA-C  Insulin  Degludec (TRESIBA ) 100 UNIT/ML SOLN Inject 4 Units into the skin at bedtime. Patient taking differently: Inject 2-3 Units into the skin at bedtime. 02/15/22  Yes Setzer, Nena PARAS, PA-C  iron polysaccharides (NIFEREX) 150 MG capsule Take 150 mg by mouth daily.   Yes [provider]  lidocaine  (LIDODERM ) 5 % Place 1 patch onto the skin daily. Remove & Discard patch within 12 hours or as directed by MD Patient taking differently: Place 1 patch onto the skin daily as needed (for pain). Remove & Discard patch within 12 hours or as directed by MD 04/07/23  Yes Trixie Nilda HERO, MD  methocarbamol  (ROBAXIN ) 500 MG  tablet Take 1-2 tablets (500-1,000 mg total) by mouth every 6 (six) hours as needed for muscle spasms. 04/25/23  Yes McKenzie, Kayla J, PA-C  methotrexate 50 MG/2ML injection Inject 0.4 mLs into the muscle once a week. 07/26/22  Yes [provider]  mirtazapine  (REMERON ) 7.5 MG tablet Take 7.5 mg by mouth at bedtime.   Yes [provider]  Multiple Vitamins-Minerals (CENTRUM SILVER  50+WOMEN PO) Take 1 tablet by mouth daily.   Yes [provider]  nitroGLYCERIN  (NITRODUR - DOSED IN MG/24 HR) 0.2 mg/hr patch Place 1 patch (0.2 mg total) onto the skin daily. 04/15/23  Yes Harden Jerona GAILS, MD  predniSONE  (DELTASONE ) 5 MG tablet Take 1 tablet (5 mg total) by mouth daily with breakfast. 02/15/22  Yes Setzer, Nena PARAS, PA-C  pregabalin (LYRICA) 50 MG capsule Take 50 mg by mouth  in the morning.   Yes [provider]  pregabalin (LYRICA) 75 MG capsule Take 75 mg by mouth at bedtime.   Yes [provider]  protein supplement shake (PREMIER PROTEIN) LIQD Take 11 oz by mouth 2 (two) times daily.   Yes [provider]  pyrithione zinc  (SELSUN BLUE DRY SCALP) 1 % shampoo Apply 1 application topically once a week. 04/15/15  Yes [provider]  RESTASIS  0.05 % ophthalmic emulsion Place 1 drop into both eyes 2 (two) times daily. 02/13/19  Yes [provider]  nitroGLYCERIN  (NITROSTAT ) 0.4 MG SL tablet Dissolve 1 tablet under the tongue every 5 minutes as needed for chest pain. Max of 3 doses, then 911. Patient not taking: Reported on 01/26/2024 09/05/23   Nahser, Aleene PARAS, MD    Allergies:    Allergies  Allergen Reactions   Cholestyramine      Possible- Makes throat burn. Patient said she can't take it    Compazine [Prochlorperazine Edisylate] Swelling    REACTION:  tongue swell and unable to swallow   Celecoxib Diarrhea   Sulfonamide Derivatives Other (See Comments)    REACTION:  broke out with fine itching bumps   Hydrocodone  Nausea Only   Infliximab Other (See Comments)    rash   Leflunomide Other (See Comments)    diarrhea   Prochlorperazine Other (See Comments)    Other reaction(s): Unknown  Other Reaction(s): tongue swells   Rosuvastatin     muscle aches   Sulfa Antibiotics Other (See Comments)    tongue swelling   Social History:   Social History   Socioeconomic History   Marital status: Widowed    Spouse name: Zachary   Number of children: 2   Years of education: 13   Highest education level: Associate degree: academic program  Occupational History    Employer: RETIRED  Tobacco Use   Smoking status: Never   Smokeless tobacco: Never  Vaping Use   Vaping status: Never Used  Substance and Sexual Activity   Alcohol  use: No    Alcohol /week: 0.0 standard drinks of alcohol    Drug use: No   Sexual  activity: Yes    Birth control/protection: Post-menopausal  Other Topics Concern   Not on file  Social History Narrative   She is married to Lynch and lives with him 2 children   Retired   No alcohol  or drugs and never smoker   Social Drivers of Corporate Investment Banker Strain: Low Risk  (07/24/2018)   Overall Financial Resource Strain (CARDIA)    Difficulty of Paying Living Expenses: Not hard at all  Food Insecurity: No Food  Insecurity (04/04/2023)   Hunger Vital Sign    Worried About Running Out of Food in the Last Year: Never true    Ran Out of Food in the Last Year: Never true  Transportation Needs: No Transportation Needs (04/04/2023)   PRAPARE - Administrator, Civil Service (Medical): No    Lack of Transportation (Non-Medical): No  Physical Activity: Sufficiently Active (07/24/2018)   Exercise Vital Sign    Days of Exercise per Week: 5 days    Minutes of Exercise per Session: 30 min  Stress: No Stress Concern Present (07/24/2018)   [employer] of Occupational Health - Occupational Stress Questionnaire    Feeling of Stress : Not at all  Social Connections: Socially Integrated (04/04/2023)   Social Connection and Isolation Panel    Frequency of Communication with Friends and Family: More than three times a week    Frequency of Social Gatherings with Friends and Family: More than three times a week    Attends Religious Services: More than 4 times per year    Active Member of [employer] or Organizations: Yes    Attends Engineer, Structural: More than 4 times per year    Marital Status: Married  Catering Manager Violence: Not At Risk (04/04/2023)   Humiliation, Afraid, Rape, and Kick questionnaire    Fear of Current or Ex-Partner: No    Emotionally Abused: No    Physically Abused: No    Sexually Abused: No    Family History:   The patient's family history includes Diabetes in her father and mother; Heart attack in her father and mother; Heart failure  in her father and mother; Stroke in her sister.    ROS:   Review of Systems: [y] = yes, [ ]  = no      General: Weight gain [ ] ; Weight loss [ ] ; Anorexia [ ] ; Fatigue [ ] ; Fever [ ] ; Chills [ ] ; Weakness [ ]    Cardiac: Chest pain/pressure [ ] ; Resting SOB [ ] ; Exertional SOB [ ] ; Orthopnea [ ] ; Pedal Edema [ ] ; Palpitations [ ] ; Syncope [y]; Presyncope [ ] ; Paroxysmal nocturnal dyspnea [ ]    Pulmonary: Cough [ ] ; Wheezing [ ] ; Hemoptysis [ ] ; Sputum [ ] ; Snoring [ ]    GI: Vomiting [ ] ; Dysphagia [ ] ; Melena [ ] ; Hematochezia [ ] ; Heartburn [ ] ; Abdominal pain [ ] ; Constipation [ ] ; Diarrhea [ ] ; BRBPR [ ]    GU: Hematuria [ ] ; Dysuria [ ] ; Nocturia [ ]  Vascular: Pain in legs with walking [ ] ; Pain in feet with lying flat [ ] ; Non-healing sores [ ] ; Stroke [ ] ; TIA [ ] ; Slurred speech [ ] ;   Neuro: Headaches [ ] ; Vertigo [ ] ; Seizures [ ] ; Paresthesias [ ] ;Blurred vision [ ] ; Diplopia [ ] ; syncope [y]   Ortho/Skin: Arthritis [ ] ; Joint pain [ ] ; Muscle pain [ ] ; Joint swelling [ ] ; Back Pain [ ] ; Rash [ ]    Psych: Depression [ ] ; Anxiety [ ]    Heme: Bleeding problems [ ] ; Clotting disorders [ ] ; Anemia [ ]    Endocrine: Diabetes [ ] ; Thyroid  dysfunction [ ]    Physical Exam/Data:   Vitals:   01/26/24 0645 01/26/24 0650 01/26/24 0700 01/26/24 0725  BP: 119/75  111/63   Pulse: 72 71 69   Resp: (!) 23 (!) 25 (!) 21   Temp:    99.9 F (37.7 C)  TempSrc:    Oral  SpO2: 97% 96% 99%  Weight:        Intake/Output Summary (Last 24 hours) at 01/26/2024 0824 Last data filed at 01/26/2024 0501 Gross per 24 hour  Intake 180.53 ml  Output --  Net 180.53 ml      01/26/2024    5:00 AM 12/10/2023   11:27 AM 09/18/2023   10:05 AM  Last 3 Weights  Weight (lbs) 116 lb 2.9 oz 116 lb 116 lb  Weight (kg) 52.7 kg 52.617 kg 52.617 kg     Body mass index is 21.25 kg/m.  General:  Well nourished, well developed, in no acute distress HEENT: R head ecchymosis  Vascular: No carotid bruits; FA pulses  2+ bilaterally without bruits  Cardiac:  normal S1, S2; slow rate, regular rhythm; no murmur  Lungs:  clear to auscultation bilaterally, no wheezing, rhonchi or rales  Abd: soft, nontender, no hepatomegaly  Ext: no LE edema Musculoskeletal:  No deformities, BUE and BLE strength normal and equal Skin: warm and dry, RIJ TVP in place  Neuro: CNs 2-12 intact, no focal abnormalities noted Psych: Normal affect   EKG:  The ECG that was done 01/26/24 (02:41:31) was personally reviewed and demonstrates CHB with ventricular escape 32, QRS 117, QT/c 505/369   Relevant CV Studies:  TTE Result date: 01/26/24  1. Left ventricular ejection fraction, by estimation, is >75%. The left  ventricle has hyperdynamic function. The left ventricle has no regional  wall motion abnormalities. There is mild concentric left ventricular  hypertrophy. Left ventricular diastolic  parameters are consistent with Grade I diastolic dysfunction (impaired  relaxation). Elevated left ventricular end-diastolic pressure.   2. Right ventricular systolic function is normal. The right ventricular  size is normal. There is mildly elevated pulmonary artery systolic  pressure. The estimated right ventricular systolic pressure is 40.2 mmHg.   3. Left atrial size was severely dilated.   4. Right atrial size was moderately dilated.   5. The mitral valve is degenerative. Trivial mitral valve regurgitation.  Mild mitral stenosis. The mean mitral valve gradient is 4.0 mmHg with  average heart rate of 80 bpm.   6. The aortic valve is tricuspid. Aortic valve regurgitation is not  visualized. Aortic valve sclerosis/calcification is present, without any  evidence of aortic stenosis.   7. The inferior vena cava is dilated in size with <50% respiratory  variability, suggesting right atrial pressure of 15 mmHg.   Laboratory Data:  High Sensitivity Troponin:   Recent Labs  Lab 01/26/24 0000 01/26/24 0228  TROPONINIHS 17 19*       Chemistry Recent Labs  Lab 01/26/24 0000  NA 141  K 4.5  CL 104  CO2 27  GLUCOSE 234*  BUN 36*  CREATININE 1.39*  CALCIUM  8.3*  GFRNONAA 36*  ANIONGAP 10    Recent Labs  Lab 01/26/24 0000  PROT 5.8*  ALBUMIN  3.0*  AST 31  ALT 24  ALKPHOS 52  BILITOT 0.9   Hematology Recent Labs  Lab 01/26/24 0000  WBC 10.2  RBC 2.99*  HGB 9.5*  HCT 30.2*  MCV 101.0*  MCH 31.8  MCHC 31.5  RDW 16.1*  PLT 173   BNPNo results for input(s): BNP, PROBNP in the last 168 hours.  DDimer No results for input(s): DDIMER in the last 168 hours.  Radiology/Studies:  CARDIAC CATHETERIZATION Result Date: 01/26/2024 Temporary pacemaker implantation for complete heart block 01/26/2024: Under fluoroscopic guidance and micropuncture technique, right IJ utilized and a 6 French sheath introduced followed by balloontipped pacemaker insertion into the  RV apex with excellent capture at 0.1 mA.  Pacemaker left at 10 mA at VVI 70 bpm.  Patient is in atrial tachycardia and presently has not needed pacing.   CT CHEST ABDOMEN PELVIS WO CONTRAST Result Date: 01/26/2024 EXAM: CT CHEST, ABDOMEN AND PELVIS WITHOUT CONTRAST 01/26/2024 02:20:33 AM TECHNIQUE: CT of the chest, abdomen and pelvis was performed without the administration of intravenous contrast. Multiplanar reformatted images are provided for review. Automated exposure control, iterative reconstruction, and/or weight based adjustment of the mA/kV was utilized to reduce the radiation dose to as low as reasonably achievable. COMPARISON: 04/03/2023 CLINICAL HISTORY: Recent syncope. FINDINGS: CHEST: MEDIASTINUM AND LYMPH NODES: Thoracic aorta and branches show atherosclerotic calcification. No aneurysmal dilatation is seen. Lack of IV contrast precludes evaluation for dissection. The heart is not significantly enlarged. Pulmonary artery is not enlarged. Coronary calcifications are seen. The esophagus is within normal limits. No mediastinal, hilar or  axillary lymphadenopathy. The thoracic inlet is within normal limits. LUNGS AND PLEURA: A tiny 3 mm nodule is noted in the left upper lobe. This is stable from a prior exam from 2023 and felt to be benign in etiology. No other significant pulmonary nodule is noted. Minimal left basilar atelectasis is seen. No focal effusion or pneumothorax is seen. ABDOMEN AND PELVIS: LIVER: The liver shows evidence of pneumobilia likely related to a prior procedure. No ductal dilatation is seen. GALLBLADDER AND BILE DUCTS: The gallbladder has been surgically removed. No biliary ductal dilatation. SPLEEN: The spleen is within normal limits. PANCREAS: The pancreas is within normal limits. ADRENAL GLANDS: Adrenal glands are unremarkable. KIDNEYS, URETERS AND BLADDER: Cystic lesions are again seen in the kidneys bilaterally, stable dating back to 2023 and felt to be benign in etiology. No further follow-up is recommended. No renal calculi or obstructive changes are seen. The bladder is well distended. GI AND BOWEL: Stomach and small bowel are unremarkable. No obstructive or inflammatory changes of the colon are seen. Significant sigmoid diverticulosis is noted without diverticulitis. Scattered fecal material is noted throughout the colon without obstructive change. The appendix is within normal limits. REPRODUCTIVE ORGANS: The uterus and adnexa are within normal limits. PERITONEUM AND RETROPERITONEUM: No free fluid is seen. No free air. VASCULATURE: Abdominal aortic calcifications are seen without aneurysmal dilatation. ABDOMINAL AND PELVIS LYMPH NODES: No lymphadenopathy is noted. BONES AND SOFT TISSUES: No acute rib abnormality is noted. Degenerative changes of the thoracic spine are seen. Changes of prior vertebral augmentation at L1 are noted. Degenerative changes of the lumbar spine are seen. No focal soft tissue abnormality. IMPRESSION: 1. No acute findings in the chest, abdomen, and pelvis related to syncope. 2. Stable 3 mm left  upper lobe pulmonary nodule, benign, no follow-up recommended per prior stability. 3. Significant sigmoid diverticulosis without diverticulitis. Electronically signed by: Oneil Devonshire MD 01/26/2024 02:31 AM EST RP Workstation: [id number]   DG Chest Port 1 View Result Date: 01/26/2024 EXAM: 1 VIEW(S) XRAY OF THE CHEST 01/26/2024 12:58:00 AM COMPARISON: 08/08/2022. CLINICAL HISTORY: fall FINDINGS: LINES, TUBES AND DEVICES: Loop recorder is noted on the left. LUNGS AND PLEURA: The lungs are well aerated bilaterally. No focal pulmonary opacity. No pleural effusion. No pneumothorax. HEART AND MEDIASTINUM: Cardiac shadow is stable. Aortic calcifications are again seen. BONES AND SOFT TISSUES: No acute osseous abnormality. Old Right 7th rib fracture is noted posteriorly. IMPRESSION: 1. No acute cardiopulmonary process. Electronically signed by: Oneil Devonshire MD 01/26/2024 01:09 AM EST RP Workstation: [id number]   CT MAXILLOFACIAL WO CONTRAST Result Date: 01/26/2024 EXAM:  CT OF THE FACE WITHOUT CONTRAST 01/26/2024 12:26:00 AM TECHNIQUE: CT of the face was performed without the administration of intravenous contrast. Multiplanar reformatted images are provided for review. Automated exposure control, iterative reconstruction, and/or weight based adjustment of the mA/kV was utilized to reduce the radiation dose to as low as reasonably achievable. COMPARISON: None available. CLINICAL HISTORY: Facial trauma, blunt Facial trauma, blunt FINDINGS: FACIAL BONES: No acute facial fracture. No mandibular dislocation. No suspicious bone lesion. ORBITS: Globes are intact. No acute traumatic injury. No inflammatory change. SINUSES AND MASTOIDS: No acute abnormality. SOFT TISSUES: No acute abnormality. IMPRESSION: 1. No acute facial fracture. Electronically signed by: Franky Stanford MD 01/26/2024 12:57 AM EST RP Workstation: [id number]   CT CERVICAL SPINE WO CONTRAST Result Date: 01/26/2024 EXAM: CT CERVICAL SPINE WITHOUT CONTRAST  01/26/2024 12:26:00 AM TECHNIQUE: CT of the cervical spine was performed without the administration of intravenous contrast. Multiplanar reformatted images are provided for review. Automated exposure control, iterative reconstruction, and/or weight based adjustment of the mA/kV was utilized to reduce the radiation dose to as low as reasonably achievable. COMPARISON: None available. CLINICAL HISTORY: Neck trauma (Age >= 65y) FINDINGS: CERVICAL SPINE: BONES AND ALIGNMENT: Grade 1 anterolisthesis at C3-C4 and grade 1 retrolisthesis at C4-C5. No acute fracture. DEGENERATIVE CHANGES: No significant degenerative changes. SOFT TISSUES: No prevertebral soft tissue swelling. IMPRESSION: 1. No acute abnormality of the cervical spine related to neck trauma. 2. Grade 1 anterolisthesis at C3-4 and grade 1 retrolisthesis at C4-5. Electronically signed by: Franky Stanford MD 01/26/2024 12:56 AM EST RP Workstation: [id number]   CT HEAD WO CONTRAST ( ) Result Date: 01/26/2024 EXAM: CT HEAD WITHOUT CONTRAST 01/26/2024 12:26:00 AM TECHNIQUE: CT of the head was performed without the administration of intravenous contrast. Automated exposure control, iterative reconstruction, and/or weight based adjustment of the mA/kV was utilized to reduce the radiation dose to as low as reasonably achievable. COMPARISON: 07/22/2018 CLINICAL HISTORY: Head trauma, minor (Age >= 65y) FINDINGS: BRAIN AND VENTRICLES: Slightly increased size of right parafalcine anterior meningioma measuring 0.9 x 0.9 x 1.1 cm, previously. No acute intracranial hemorrhage. Periventricular white matter changes, likely sequela of chronic small vessel ischemic disease. No evidence of acute infarct. No hydrocephalus. No extra-axial collection. No mass effect or midline shift. ORBITS: Bilateral lens replacements. SINUSES: No acute abnormality. SOFT TISSUES AND SKULL: Left parieto-occipital scalp hematoma. No skull fracture. IMPRESSION: 1. No acute intracranial abnormality  related to the head trauma. 2. Left parieto-occipital scalp hematoma. 3. Slightly increased size of right parafalcine anterior meningioma, now measuring 0.9 x 0.9 x 1.1 cm. 4. Periventricular white matter changes, likely sequela of chronic small vessel ischemic disease. Electronically signed by: Franky Stanford MD 01/26/2024 12:53 AM EST RP Workstation: [id number]   Assessment and Plan:  Shelby Walker is a 89 y.o. female with sinus arrest s/p PPM (01/2022) c/b MSSA bacteremia and septic shock s/p device removal (01/29/22), nonobstructive CAD who presents with LOC with associated sinus arrest.  CHB Syncope  This morning she had CHB with no underlying escape with VVI 30 through her TVP.  Later in the day she had resolution of heart block with NSR in the 80s.  We discussed the risk, benefits and alternatives of reimplantation of PPM.  She understands that with recurrent syncopal events and now with intermittent CHB there really is not an alternative to avoid recurrent episodes.  With her history of MSSA bacteremia following device implant I offered leadless PPM as an alternative to traditional PPM implant.  If we proceeded with  traditional PPM I would implant on the right sinus to avoid the previous pocket site.  The risk, benefits and alternatives of this were reviewed with her and the family which include but are not limited to incisional pain, hematoma, bleeding, damage to the lung/heart requiring emergency drain and/or open heart surgery, lead perforation or dislodgment, arrhythmias and death.  I explained that the risk of leadless implantation included vascular access complications, hematoma, damage to the right femoral vein, cardiac perforation, tricuspid valve damage requiring subsequent repair or replacement and median sternotomy as well as arrhythmias and death.  I do think that leadless PPM implant carries a slightly higher risk of cardiac perforation as opposed to traditional PPM implant however the  infection risk is low or and with her history of previous infection I would favor leadless PPM.  Whether we would require a dual or single chamber leadless system will depend on the next few days of telemetry.  At minimum would need a VVI PPM and this may be sufficient for intermittent complete heart block although this would not address her atrial pauses but would provide backup support pacing.  She and her family are going to consider these options but were likely going to preference leadless PPM implant on Wednesday.  I will have availability to accommodate this.  Severity of Illness: The appropriate patient status for this patient is INPATIENT. Inpatient status is judged to be reasonable and necessary in order to provide the required intensity of service to ensure the patient's safety. The patient's presenting symptoms, physical exam findings, and initial radiographic and laboratory data in the context of their chronic comorbidities is felt to place them at high risk for further clinical deterioration. Furthermore, it is not anticipated that the patient will be medically stable for discharge from the hospital within 2 midnights of admission.   * I certify that at the point of admission it is my clinical judgment that the patient will require inpatient hospital care spanning beyond 2 midnights from the point of admission due to high intensity of service, high risk for further deterioration and high frequency of surveillance required.*   For questions or updates, please contact [facility] Please consult [email or web address] for contact info under   Signed, Donnice DELENA Primus, MD  01/26/2024 8:24 AM

## 2024-01-26 NOTE — H&P (Addendum)
 Cardiology Admission History and Physical   Patient ID: MAZELLE HUEBERT MRN: [id number]; DOB: April 02, 1933   Admission date: 01/25/2024  PCP:  Nichole Senior, MD   [facility] Providers Cardiologist:  Aleene Passe, MD (Inactive)  Electrophysiologist:  Elspeth Sage, MD (Inactive)   Chief Complaint:  falls and sinus pause  Patient Profile: Shelby Walker is a 89 y.o. female with a history of sinus pause s/p ppm in 01/2022 (25 second pause) c/b MSSA bacteremia with associated device infection s/p removal 3 weeks later, RA, nonobstructive CAD of LM, LAD, RCA who is being seen 01/26/2024 for the evaluation of sinus pause.  History of Present Illness: Shelby Walker had a ppm placed in Nov. 3rd, 2023 for a 25 second sinus pause. However, she developed an MSSA bacteremia c/b septic shock, and her device was removed on 01/29/2022. Reimplantation of her ppm was deferred as she did not want another ppm after her infection (complicated course with long recovery), and she had not had any syncope from the pause back in 2023. She had not had any issues with syncope or sinus pauses for the past 2 years and had a loop recorder placed in January of 2025.   However, on Wednesday, she had an LOC episode with a fall and struck her head. She again had a syncopal episode again earlier on Saturday and struck her chest. EMS were called, and she had a long sinus pause en route.   On arrival to the ED, she states she has had chest pain and shortness of breath along with lightheadedness prior to these episodes.   In the ED, she developed frequent sinus pauses and had a brief CPR code that lasted ~30 seconds of compressions. She was started on isoproterenol , and then developed symptomatic complete heart block.   Given her symptoms (intense chest pain, lightheadedness) with the complete heart block despite escalating doses of isoproterenol , decision was made to place a TVP.     Past Medical History:  Diagnosis Date    Anemia    Bruit    Carotid Doppler showed no significant abnormality     9per patient)   C. difficile colitis 07/2018   with severe sepsis   Chest pain, unspecified    Nuclear, May, 2008, no scar or ischemia   Chronic kidney disease    Diabetes mellitus    Diverticulosis    Dyslipidemia    Dysrhythmia    a-fib   Ejection fraction    EF 55-60%, echo, February, 2011 // Echocardiogram 8/21: EF 60-65, no RWMA, Gr 1 DD, GLS -14%, normal RVSF, mild LAE, trivial MR, mild MS (mean 4 mmHg), RVSP 23.4    GERD (gastroesophageal reflux disease)    History of kidney stones    removed   HLD (hyperlipidemia)    Implantable loop recorder present    Memory loss    Mitral regurgitation 04/21/2009   mild,  echo, February, 2011   Osteoporosis    Palpitations    possible very brief atrial fibrillation on monitor and possible reentrant tachycardia   Pneumonia    PONV (postoperative nausea and vomiting)    Psoriasis    Rheumatoid arthritis (HCC)    Past Surgical History:  Procedure Laterality Date   ABDOMINAL AORTOGRAM W/LOWER EXTREMITY Right 10/19/2020   Procedure: ABDOMINAL AORTOGRAM W/LOWER EXTREMITY;  Surgeon: Darron Deatrice LABOR, MD;  Location: [facility];  Service: Cardiovascular;  Laterality: Right;   CHOLECYSTECTOMY N/A 08/09/2022   Procedure: LAPAROSCOPIC CHOLECYSTECTOMY WITH INTRAOPERATIVE  CHOLANGIOGRAM;  Surgeon: Lyndel Deward PARAS, MD;  Location: [facility] OR;  Service: General;  Laterality: N/A;   ENDOSCOPIC RETROGRADE CHOLANGIOPANCREATOGRAPHY (ERCP) WITH PROPOFOL  N/A 04/05/2023   Procedure: ENDOSCOPIC RETROGRADE CHOLANGIOPANCREATOGRAPHY (ERCP) WITH PROPOFOL ;  Surgeon: Abran Norleen SAILOR, MD;  Location: [facility];  Service: Gastroenterology;  Laterality: N/A;   FRACTURE SURGERY     KYPHOPLASTY N/A 04/25/2023   Procedure: LUMBAR 1 KYPHOPLASTY;  Surgeon: Beuford Anes, MD;  Location: [facility] OR;  Service: Orthopedics;  Laterality: N/A;   LOOP Recorder placement     LOOP RECORDER REMOVAL  N/A 01/05/2022   Procedure: LOOP RECORDER REMOVAL;  Surgeon: Waddell Danelle ORN, MD;  Location: [facility];  Service: Cardiovascular;  Laterality: N/A;   PACEMAKER IMPLANT N/A 01/05/2022   Procedure: [employer] PACEMAKER IMPLANT;  Surgeon: Waddell Danelle ORN, MD;  Location: [facility];  Service: Cardiovascular;  Laterality: N/A;   PPM GENERATOR REMOVAL N/A 01/29/2022   Procedure: PPM GENERATOR REMOVAL;  Surgeon: Waddell Danelle ORN, MD;  Location: [facility];  Service: Cardiovascular;  Laterality: N/A;   REMOVAL OF STONES  04/05/2023   Procedure: REMOVAL OF STONES;  Surgeon: Abran Norleen SAILOR, MD;  Location: [facility];  Service: Gastroenterology;;   ANNETT  04/05/2023   Procedure: ANNETT;  Surgeon: Abran Norleen SAILOR, MD;  Location: [facility];  Service: Gastroenterology;;   TEE WITHOUT CARDIOVERSION N/A 01/29/2022   Procedure: TRANSESOPHAGEAL ECHOCARDIOGRAM (TEE);  Surgeon: Barbaraann Darryle Ned, MD;  Location: [facility] ENDOSCOPY;  Service: Cardiovascular;  Laterality: N/A;   TRANSMETATARSAL AMPUTATION Right 01/18/2021   Procedure: TRANSMETATARSAL AMPUTATION;  Surgeon: Kit Norleen, MD;  Location: [facility] OR;  Service: Orthopedics;  Laterality: Right;     Medications Prior to Admission: Prior to Admission medications   Medication Sig Start Date End Date Taking? Authorizing Provider  acetaminophen  (TYLENOL ) 325 MG tablet Take 650 mg by mouth every 6 (six) hours as needed for moderate pain (pain score 4-6).    [provider]  amiodarone  (PACERONE ) 200 MG tablet Take 0.5 tablets (100 mg total) by mouth daily. 06/18/23   Fernande Elspeth BROCKS, MD  atorvastatin  (LIPITOR) 20 MG tablet Take 1 tablet by mouth once daily 08/14/23   Klein, Steven C, MD  Calcium  Carbonate-Vitamin D 600-400 MG-UNIT tablet Take 1 tablet by mouth daily at 12 noon.     [provider]  denosumab  (PROLIA ) 60 MG/ML SOSY injection Inject 60 mg into the skin every 6 (six) months.    [provider]  diclofenac  Sodium (VOLTAREN ) 1 % GEL Apply 4 g topically 4 (four) times daily. 02/15/22   Setzer, Sandra J, PA-C  esomeprazole  (NEXIUM ) 20 MG capsule Take 20 mg by mouth daily at 12 noon.    [provider]  folic acid  (FOLVITE ) 1 MG tablet Take 1 mg by mouth daily.    [provider]  insulin  aspart (NOVOLOG ) 100 UNIT/ML FlexPen Inject 2 Units into the skin 3 (three) times daily with meals. Sliding scale 02/15/22   Setzer, Nena PARAS, PA-C  Insulin  Degludec (TRESIBA ) 100 UNIT/ML SOLN Inject 4 Units into the skin at bedtime. Patient taking differently: Inject 6 Units into the skin at bedtime. 02/15/22   Setzer, Sandra J, PA-C  iron polysaccharides (NIFEREX) 150 MG capsule Take 150 mg by mouth daily.    [provider]  lidocaine  (LIDODERM ) 5 % Place 1 patch onto the skin daily. Remove & Discard patch within 12 hours or as directed by MD 04/07/23   Trixie Nilda HERO,  MD  methocarbamol  (ROBAXIN ) 500 MG tablet Take 1-2 tablets (500-1,000 mg total) by mouth every 6 (six) hours as needed for muscle spasms. 04/25/23   McKenzie, Kayla J, PA-C  methotrexate 50 MG/2ML injection Inject 0.4 mLs into the muscle once a week. 07/26/22   [provider]  mirtazapine  (REMERON ) 7.5 MG tablet Take 7.5 mg by mouth at bedtime.    [provider]  Multiple Vitamins-Minerals (CENTRUM SILVER  50+WOMEN PO) Take 1 tablet by mouth daily.    [provider]  nitroGLYCERIN  (NITRODUR - DOSED IN MG/24 HR) 0.2 mg/hr patch Place 1 patch (0.2 mg total) onto the skin daily. 04/15/23   Harden Jerona GAILS, MD  nitroGLYCERIN  (NITROSTAT ) 0.4 MG SL tablet Dissolve 1 tablet under the tongue every 5 minutes as needed for chest pain. Max of 3 doses, then 911. 09/05/23   Nahser, Aleene PARAS, MD  predniSONE  (DELTASONE ) 20 MG tablet Take 1 tablet (20 mg total) by mouth daily with breakfast. 12/04/23   Valdemar Rocky SAUNDERS, NP  predniSONE  (DELTASONE ) 5 MG tablet Take 1 tablet (5 mg total) by mouth  daily with breakfast. 02/15/22   Setzer, Nena PARAS, PA-C  pregabalin (LYRICA) 50 MG capsule Take 50 mg by mouth in the morning.    [provider]  pregabalin (LYRICA) 75 MG capsule Take 75 mg by mouth at bedtime.    [provider]  protein supplement shake (PREMIER PROTEIN) LIQD Take 2 oz by mouth 2 (two) times daily.    [provider]  pyrithione zinc  (SELSUN BLUE DRY SCALP) 1 % shampoo Apply 1 application topically once a week. 04/15/15   [provider]  RESTASIS  0.05 % ophthalmic emulsion Place 1 drop into both eyes 2 (two) times daily. 02/13/19   [provider]     Allergies:    Allergies  Allergen Reactions   Cholestyramine      Possible- Makes throat burn. Patient said she can't take it    Compazine [Prochlorperazine Edisylate] Swelling    REACTION:  tongue swell and unable to swallow   Celecoxib Diarrhea   Sulfonamide Derivatives Other (See Comments)    REACTION:  broke out with fine itching bumps   Hydrocodone  Other (See Comments)   Infliximab Other (See Comments)    Other reaction(s): rash   Leflunomide Other (See Comments)    Other reaction(s): diarrhea   Prochlorperazine Other (See Comments)    Other reaction(s): Unknown  Other Reaction(s): tongue swells   Rosuvastatin     Other reaction(s): muscle aches   Sulfa Antibiotics Other (See Comments)    Other reaction(s): tongue swelling    Social History:   Social History   Socioeconomic History   Marital status: Widowed    Spouse name: Zachary   Number of children: 2   Years of education: 13   Highest education level: Associate degree: academic program  Occupational History    Employer: RETIRED  Tobacco Use   Smoking status: Never   Smokeless tobacco: Never  Vaping Use   Vaping status: Never Used  Substance and Sexual Activity   Alcohol  use: No    Alcohol /week: 0.0 standard drinks of alcohol    Drug use: No   Sexual activity: Yes    Birth control/protection:  Post-menopausal  Other Topics Concern   Not on file  Social History Narrative   She is married to [place] and lives with him 2 children   Retired   No alcohol  or drugs and never smoker   Social Drivers  of Health   Financial Resource Strain: Low Risk  (07/24/2018)   Overall Financial Resource Strain (CARDIA)    Difficulty of Paying Living Expenses: Not hard at all  Food Insecurity: No Food Insecurity (04/04/2023)   Hunger Vital Sign    Worried About Running Out of Food in the Last Year: Never true    Ran Out of Food in the Last Year: Never true  Transportation Needs: No Transportation Needs (04/04/2023)   PRAPARE - Administrator, Civil Service (Medical): No    Lack of Transportation (Non-Medical): No  Physical Activity: Sufficiently Active (07/24/2018)   Exercise Vital Sign    Days of Exercise per Week: 5 days    Minutes of Exercise per Session: 30 min  Stress: No Stress Concern Present (07/24/2018)   [employer] of Occupational Health - Occupational Stress Questionnaire    Feeling of Stress : Not at all  Social Connections: Socially Integrated (04/04/2023)   Social Connection and Isolation Panel    Frequency of Communication with Friends and Family: More than three times a week    Frequency of Social Gatherings with Friends and Family: More than three times a week    Attends Religious Services: More than 4 times per year    Active Member of [employer] or Organizations: Yes    Attends Engineer, Structural: More than 4 times per year    Marital Status: Married  Catering Manager Violence: Not At Risk (04/04/2023)   Humiliation, Afraid, Rape, and Kick questionnaire    Fear of Current or Ex-Partner: No    Emotionally Abused: No    Physically Abused: No    Sexually Abused: No     Family History:   The patient's family history includes Diabetes in her father and mother; Heart attack in her father and mother; Heart failure in her father and mother; Stroke in her  sister.    ROS:  Please see the history of present illness.  All other ROS reviewed and negative.     Physical Exam/Data: Vitals:   01/26/24 0004 01/26/24 0115 01/26/24 0200  BP: 121/74 (!) 149/65 (!) 145/61  Pulse: 73 79 79  Resp: 12 15 13   Temp: 98 F (36.7 C)    SpO2: 100% 97% 99%   No intake or output data in the 24 hours ending 01/26/24 0320    12/10/2023   11:27 AM 09/18/2023   10:05 AM 04/25/2023   10:25 AM  Last 3 Weights  Weight (lbs) 116 lb 116 lb 120 lb  Weight (kg) 52.617 kg 52.617 kg 54.432 kg     There is no height or weight on file to calculate BMI.  General:  Well nourished, well developed, in moderate acute distress HEENT: normal Neck: no JVD Vascular: No carotid bruits; Distal pulses 2+ bilaterally   Cardiac:  normal S1, S2; RRR; no murmur  Lungs:  clear to auscultation bilaterally, no wheezing, rhonchi or rales  Abd: soft, nontender, no hepatomegaly  Ext: no edema Musculoskeletal:  No deformities, BUE and BLE strength normal and equal Skin: warm and dry  Neuro:  no focal abnormalities noted Psych:  Normal affect   EKG:  The ECG that was done was personally reviewed and demonstrates CHB with junctional escape in the 30s with RBBB morphology.  Relevant CV Studies: TTE pending  Laboratory Data: High Sensitivity Troponin:   Recent Labs  Lab 01/26/24 0000 01/26/24 0228  TROPONINIHS 17 19*      Chemistry Recent Labs  Lab 01/26/24 0000  NA 141  K 4.5  CL 104  CO2 27  GLUCOSE 234*  BUN 36*  CREATININE 1.39*  CALCIUM  8.3*  GFRNONAA 36*  ANIONGAP 10    Recent Labs  Lab 01/26/24 0000  PROT 5.8*  ALBUMIN  3.0*  AST 31  ALT 24  ALKPHOS 52  BILITOT 0.9   Hematology Recent Labs  Lab 01/26/24 0000  WBC 10.2  RBC 2.99*  HGB 9.5*  HCT 30.2*  MCV 101.0*  MCH 31.8  MCHC 31.5  RDW 16.1*  PLT 173   Radiology/Studies:  CT CHEST ABDOMEN PELVIS WO CONTRAST Result Date: 01/26/2024 EXAM: CT CHEST, ABDOMEN AND PELVIS WITHOUT CONTRAST  01/26/2024 02:20:33 AM TECHNIQUE: CT of the chest, abdomen and pelvis was performed without the administration of intravenous contrast. Multiplanar reformatted images are provided for review. Automated exposure control, iterative reconstruction, and/or weight based adjustment of the mA/kV was utilized to reduce the radiation dose to as low as reasonably achievable. COMPARISON: 04/03/2023 CLINICAL HISTORY: Recent syncope. FINDINGS: CHEST: MEDIASTINUM AND LYMPH NODES: Thoracic aorta and branches show atherosclerotic calcification. No aneurysmal dilatation is seen. Lack of IV contrast precludes evaluation for dissection. The heart is not significantly enlarged. Pulmonary artery is not enlarged. Coronary calcifications are seen. The esophagus is within normal limits. No mediastinal, hilar or axillary lymphadenopathy. The thoracic inlet is within normal limits. LUNGS AND PLEURA: A tiny 3 mm nodule is noted in the left upper lobe. This is stable from a prior exam from 2023 and felt to be benign in etiology. No other significant pulmonary nodule is noted. Minimal left basilar atelectasis is seen. No focal effusion or pneumothorax is seen. ABDOMEN AND PELVIS: LIVER: The liver shows evidence of pneumobilia likely related to a prior procedure. No ductal dilatation is seen. GALLBLADDER AND BILE DUCTS: The gallbladder has been surgically removed. No biliary ductal dilatation. SPLEEN: The spleen is within normal limits. PANCREAS: The pancreas is within normal limits. ADRENAL GLANDS: Adrenal glands are unremarkable. KIDNEYS, URETERS AND BLADDER: Cystic lesions are again seen in the kidneys bilaterally, stable dating back to 2023 and felt to be benign in etiology. No further follow-up is recommended. No renal calculi or obstructive changes are seen. The bladder is well distended. GI AND BOWEL: Stomach and small bowel are unremarkable. No obstructive or inflammatory changes of the colon are seen. Significant sigmoid diverticulosis  is noted without diverticulitis. Scattered fecal material is noted throughout the colon without obstructive change. The appendix is within normal limits. REPRODUCTIVE ORGANS: The uterus and adnexa are within normal limits. PERITONEUM AND RETROPERITONEUM: No free fluid is seen. No free air. VASCULATURE: Abdominal aortic calcifications are seen without aneurysmal dilatation. ABDOMINAL AND PELVIS LYMPH NODES: No lymphadenopathy is noted. BONES AND SOFT TISSUES: No acute rib abnormality is noted. Degenerative changes of the thoracic spine are seen. Changes of prior vertebral augmentation at L1 are noted. Degenerative changes of the lumbar spine are seen. No focal soft tissue abnormality. IMPRESSION: 1. No acute findings in the chest, abdomen, and pelvis related to syncope. 2. Stable 3 mm left upper lobe pulmonary nodule, benign, no follow-up recommended per prior stability. 3. Significant sigmoid diverticulosis without diverticulitis. Electronically signed by: Oneil Devonshire MD 01/26/2024 02:31 AM EST RP Workstation: [id number]   DG Chest Port 1 View Result Date: 01/26/2024 EXAM: 1 VIEW(S) XRAY OF THE CHEST 01/26/2024 12:58:00 AM COMPARISON: 08/08/2022. CLINICAL HISTORY: fall FINDINGS: LINES, TUBES AND DEVICES: Loop recorder is noted on the left. LUNGS AND PLEURA: The lungs are well  aerated bilaterally. No focal pulmonary opacity. No pleural effusion. No pneumothorax. HEART AND MEDIASTINUM: Cardiac shadow is stable. Aortic calcifications are again seen. BONES AND SOFT TISSUES: No acute osseous abnormality. Old Right 7th rib fracture is noted posteriorly. IMPRESSION: 1. No acute cardiopulmonary process. Electronically signed by: Oneil Devonshire MD 01/26/2024 01:09 AM EST RP Workstation: [id number]   CT MAXILLOFACIAL WO CONTRAST Result Date: 01/26/2024 EXAM: CT OF THE FACE WITHOUT CONTRAST 01/26/2024 12:26:00 AM TECHNIQUE: CT of the face was performed without the administration of intravenous contrast. Multiplanar  reformatted images are provided for review. Automated exposure control, iterative reconstruction, and/or weight based adjustment of the mA/kV was utilized to reduce the radiation dose to as low as reasonably achievable. COMPARISON: None available. CLINICAL HISTORY: Facial trauma, blunt Facial trauma, blunt FINDINGS: FACIAL BONES: No acute facial fracture. No mandibular dislocation. No suspicious bone lesion. ORBITS: Globes are intact. No acute traumatic injury. No inflammatory change. SINUSES AND MASTOIDS: No acute abnormality. SOFT TISSUES: No acute abnormality. IMPRESSION: 1. No acute facial fracture. Electronically signed by: Franky Stanford MD 01/26/2024 12:57 AM EST RP Workstation: [id number]   CT CERVICAL SPINE WO CONTRAST Result Date: 01/26/2024 EXAM: CT CERVICAL SPINE WITHOUT CONTRAST 01/26/2024 12:26:00 AM TECHNIQUE: CT of the cervical spine was performed without the administration of intravenous contrast. Multiplanar reformatted images are provided for review. Automated exposure control, iterative reconstruction, and/or weight based adjustment of the mA/kV was utilized to reduce the radiation dose to as low as reasonably achievable. COMPARISON: None available. CLINICAL HISTORY: Neck trauma (Age >= 65y) FINDINGS: CERVICAL SPINE: BONES AND ALIGNMENT: Grade 1 anterolisthesis at C3-C4 and grade 1 retrolisthesis at C4-C5. No acute fracture. DEGENERATIVE CHANGES: No significant degenerative changes. SOFT TISSUES: No prevertebral soft tissue swelling. IMPRESSION: 1. No acute abnormality of the cervical spine related to neck trauma. 2. Grade 1 anterolisthesis at C3-4 and grade 1 retrolisthesis at C4-5. Electronically signed by: Franky Stanford MD 01/26/2024 12:56 AM EST RP Workstation: [id number]   CT HEAD WO CONTRAST ( ) Result Date: 01/26/2024 EXAM: CT HEAD WITHOUT CONTRAST 01/26/2024 12:26:00 AM TECHNIQUE: CT of the head was performed without the administration of intravenous contrast. Automated exposure  control, iterative reconstruction, and/or weight based adjustment of the mA/kV was utilized to reduce the radiation dose to as low as reasonably achievable. COMPARISON: 07/22/2018 CLINICAL HISTORY: Head trauma, minor (Age >= 65y) FINDINGS: BRAIN AND VENTRICLES: Slightly increased size of right parafalcine anterior meningioma measuring 0.9 x 0.9 x 1.1 cm, previously. No acute intracranial hemorrhage. Periventricular white matter changes, likely sequela of chronic small vessel ischemic disease. No evidence of acute infarct. No hydrocephalus. No extra-axial collection. No mass effect or midline shift. ORBITS: Bilateral lens replacements. SINUSES: No acute abnormality. SOFT TISSUES AND SKULL: Left parieto-occipital scalp hematoma. No skull fracture. IMPRESSION: 1. No acute intracranial abnormality related to the head trauma. 2. Left parieto-occipital scalp hematoma. 3. Slightly increased size of right parafalcine anterior meningioma, now measuring 0.9 x 0.9 x 1.1 cm. 4. Periventricular white matter changes, likely sequela of chronic small vessel ischemic disease. Electronically signed by: Franky Stanford MD 01/26/2024 12:53 AM EST RP Workstation: [id number]   Assessment and Plan: Sinus pause Complete heart block Cardiac syncope Fall  PPM s/p extraction for MSSA bacteremia The patient has had 2 falls over the past week and struck her head yesterday. Although we do not have the recordings from her ILR, it is very likely that her syncope was associated with either a sinus pause or complete heart block. She had a ppm placed  in November of 2023 for a sinus pause of 25 seconds. However, she had it removed a few weeks later due to device associated infection. She had not had any issues over the past 2 years and had a ILR placed in January 2025. En route, she had a sinus pause of unclear length and again had them in the ED. In the ED, she was started on isoproterenol  but then developed symptomatic CHB. Therefore, we  activated the cath lab for TVP. En route to the cath lab, she developed an SVT and isoproterenol  was stopped. - TVP  - EP consult for consideration of ppm   Rheumatoid arthritis  - On methotrexate  Atrial fibrillation Identified on ILR. On amiodarone . DOAC deferred due to fall risk. CHADSVASC 5 (age, sex, htn, vascular disease). No recent afib on device.   Coronary artery disease CTA in 12/2021 showed D1 significant stenosis. Decision made to treat medically. Has moderate nonobstructive disease of LM, LAD, and RCA. - Continue aspirin  - Continue atorvastatin   Risk Assessment/Risk Scores:   Code Status: DNR (per family, okay to reverse DNR for ppm)  Severity of Illness: The appropriate patient status for this patient is INPATIENT. Inpatient status is judged to be reasonable and necessary in order to provide the required intensity of service to ensure the patient's safety. The patient's presenting symptoms, physical exam findings, and initial radiographic and laboratory data in the context of their chronic comorbidities is felt to place them at high risk for further clinical deterioration. Furthermore, it is not anticipated that the patient will be medically stable for discharge from the hospital within 2 midnights of admission.   * I certify that at the point of admission it is my clinical judgment that the patient will require inpatient hospital care spanning beyond 2 midnights from the point of admission due to high intensity of service, high risk for further deterioration and high frequency of surveillance required.*  For questions or updates, please contact [facility] Please consult [email or web address] for contact info under     Signed, Jerrell DELENA Orchard, MD  01/26/2024 3:20 AM

## 2024-01-26 NOTE — Progress Notes (Signed)
   Patient admitted early this am with fall in setting of recurrent pause/CHB.   Now with TVP  Had previous PPM but removed due to infection.   TVP in good position.   D/w EP. Likely planning dual chamber leadless device.   Echo this am EF > 75% RV ok   Will continue current management.   Toribio Fuel, MD  12:34 PM

## 2024-01-26 NOTE — Progress Notes (Signed)
 Echocardiogram 2D Echocardiogram has been performed.  Damien FALCON Riann Oman RDCS 01/26/2024, 12:00 PM

## 2024-01-26 NOTE — Plan of Care (Signed)
  Problem: Clinical Measurements: Goal: Ability to maintain clinical measurements within normal limits will improve Outcome: Progressing Goal: Will remain free from infection Outcome: Progressing Goal: Diagnostic test results will improve Outcome: Progressing Goal: Respiratory complications will improve Outcome: Progressing   Problem: Nutrition: Goal: Adequate nutrition will be maintained Outcome: Progressing   Problem: Coping: Goal: Level of anxiety will decrease Outcome: Progressing   Problem: Elimination: Goal: Will not experience complications related to bowel motility Outcome: Progressing Goal: Will not experience complications related to urinary retention Outcome: Progressing   Problem: Pain Managment: Goal: General experience of comfort will improve and/or be controlled Outcome: Progressing

## 2024-01-27 ENCOUNTER — Telehealth (HOSPITAL_COMMUNITY): Payer: Self-pay | Admitting: Pharmacy Technician

## 2024-01-27 ENCOUNTER — Other Ambulatory Visit (HOSPITAL_COMMUNITY): Payer: Self-pay | Admitting: Endocrinology

## 2024-01-27 ENCOUNTER — Encounter

## 2024-01-27 ENCOUNTER — Inpatient Hospital Stay (HOSPITAL_COMMUNITY)

## 2024-01-27 ENCOUNTER — Encounter (HOSPITAL_COMMUNITY): Payer: Self-pay | Admitting: Cardiology

## 2024-01-27 DIAGNOSIS — N179 Acute kidney failure, unspecified: Secondary | ICD-10-CM

## 2024-01-27 DIAGNOSIS — R0902 Hypoxemia: Secondary | ICD-10-CM | POA: Diagnosis not present

## 2024-01-27 DIAGNOSIS — I442 Atrioventricular block, complete: Secondary | ICD-10-CM | POA: Diagnosis not present

## 2024-01-27 DIAGNOSIS — J690 Pneumonitis due to inhalation of food and vomit: Secondary | ICD-10-CM

## 2024-01-27 DIAGNOSIS — M810 Age-related osteoporosis without current pathological fracture: Secondary | ICD-10-CM | POA: Insufficient documentation

## 2024-01-27 DIAGNOSIS — J90 Pleural effusion, not elsewhere classified: Secondary | ICD-10-CM | POA: Diagnosis not present

## 2024-01-27 DIAGNOSIS — E785 Hyperlipidemia, unspecified: Secondary | ICD-10-CM

## 2024-01-27 DIAGNOSIS — E119 Type 2 diabetes mellitus without complications: Secondary | ICD-10-CM

## 2024-01-27 DIAGNOSIS — A419 Sepsis, unspecified organism: Secondary | ICD-10-CM | POA: Diagnosis not present

## 2024-01-27 DIAGNOSIS — I5030 Unspecified diastolic (congestive) heart failure: Secondary | ICD-10-CM | POA: Diagnosis not present

## 2024-01-27 DIAGNOSIS — K219 Gastro-esophageal reflux disease without esophagitis: Secondary | ICD-10-CM

## 2024-01-27 DIAGNOSIS — J9811 Atelectasis: Secondary | ICD-10-CM | POA: Diagnosis not present

## 2024-01-27 DIAGNOSIS — J9601 Acute respiratory failure with hypoxia: Secondary | ICD-10-CM

## 2024-01-27 DIAGNOSIS — R6521 Severe sepsis with septic shock: Secondary | ICD-10-CM

## 2024-01-27 DIAGNOSIS — R0989 Other specified symptoms and signs involving the circulatory and respiratory systems: Secondary | ICD-10-CM | POA: Diagnosis not present

## 2024-01-27 LAB — CBC
HCT: 28 % — ABNORMAL LOW (ref 36.0–46.0)
Hemoglobin: 8.7 g/dL — ABNORMAL LOW (ref 12.0–15.0)
MCH: 31.8 pg (ref 26.0–34.0)
MCHC: 31.1 g/dL (ref 30.0–36.0)
MCV: 102.2 fL — ABNORMAL HIGH (ref 80.0–100.0)
Platelets: 158 K/uL (ref 150–400)
RBC: 2.74 MIL/uL — ABNORMAL LOW (ref 3.87–5.11)
RDW: 17 % — ABNORMAL HIGH (ref 11.5–15.5)
WBC: 16.6 K/uL — ABNORMAL HIGH (ref 4.0–10.5)
nRBC: 0 % (ref 0.0–0.2)

## 2024-01-27 LAB — BASIC METABOLIC PANEL WITH GFR
Anion gap: 14 (ref 5–15)
BUN: 43 mg/dL — ABNORMAL HIGH (ref 8–23)
CO2: 21 mmol/L — ABNORMAL LOW (ref 22–32)
Calcium: 7.5 mg/dL — ABNORMAL LOW (ref 8.9–10.3)
Chloride: 103 mmol/L (ref 98–111)
Creatinine, Ser: 2.37 mg/dL — ABNORMAL HIGH (ref 0.44–1.00)
GFR, Estimated: 19 mL/min — ABNORMAL LOW (ref >60)
Glucose, Bld: 320 mg/dL — ABNORMAL HIGH (ref 70–99)
Potassium: 4.9 mmol/L (ref 3.5–5.1)
Sodium: 138 mmol/L (ref 135–145)

## 2024-01-27 LAB — URINALYSIS, W/ REFLEX TO CULTURE (INFECTION SUSPECTED)
Bilirubin Urine: NEGATIVE
Glucose, UA: >=500 mg/dL — AB
Ketones, ur: 20 mg/dL — AB
Nitrite: NEGATIVE
Protein, ur: 30 mg/dL — AB
Specific Gravity, Urine: 1.015 (ref 1.005–1.030)
WBC, UA: >50 WBC/hpf (ref 0–5)
pH: 5 (ref 5.0–8.0)

## 2024-01-27 LAB — LACTIC ACID, PLASMA: Lactic Acid, Venous: 2.7 mmol/L (ref 0.5–1.9)

## 2024-01-27 LAB — GLUCOSE, CAPILLARY
Glucose-Capillary: 321 mg/dL — ABNORMAL HIGH (ref 70–99)
Glucose-Capillary: 298 mg/dL — ABNORMAL HIGH (ref 70–99)
Glucose-Capillary: 452 mg/dL — ABNORMAL HIGH (ref 70–99)
Glucose-Capillary: 424 mg/dL — ABNORMAL HIGH (ref 70–99)
Glucose-Capillary: 361 mg/dL — ABNORMAL HIGH (ref 70–99)
Glucose-Capillary: 193 mg/dL — ABNORMAL HIGH (ref 70–99)
Glucose-Capillary: 129 mg/dL — ABNORMAL HIGH (ref 70–99)

## 2024-01-27 LAB — TROPONIN I (HIGH SENSITIVITY)
Troponin I (High Sensitivity): 65 ng/L — ABNORMAL HIGH (ref <18)
Troponin I (High Sensitivity): 73 ng/L — ABNORMAL HIGH (ref <18)

## 2024-01-27 LAB — EXPECTORATED SPUTUM ASSESSMENT W GRAM STAIN, RFLX TO RESP C: Special Requests: NORMAL

## 2024-01-27 LAB — BRAIN NATRIURETIC PEPTIDE: B Natriuretic Peptide: 1411.4 pg/mL — ABNORMAL HIGH (ref 0.0–100.0)

## 2024-01-27 MED ORDER — VASOPRESSIN 20 UNITS/100 ML INFUSION FOR SHOCK
0.0000 [IU]/min | INTRAVENOUS | Status: DC
Start: 2024-01-27 — End: 2024-01-28
  Administered 2024-01-27: 0.03 [IU]/min via INTRAVENOUS
  Filled 2024-01-27: qty 100

## 2024-01-27 MED ORDER — TRAMADOL HCL 50 MG PO TABS
50.0000 mg | ORAL_TABLET | Freq: Two times a day (BID) | ORAL | Status: DC | PRN
  Administered 2024-01-27 – 2024-01-29 (×3): 50 mg via ORAL
  Filled 2024-01-27 (×4): qty 1

## 2024-01-27 MED ORDER — LACTATED RINGERS IV BOLUS
500.0000 mL | Freq: Once | INTRAVENOUS | Status: AC
  Administered 2024-01-27: 500 mL via INTRAVENOUS

## 2024-01-27 MED ORDER — INSULIN ASPART 100 UNIT/ML IJ SOLN: 5.0000 [IU] | Freq: Three times a day (TID) | INTRAMUSCULAR | Status: DC

## 2024-01-27 MED ORDER — INSULIN ASPART 100 UNIT/ML IJ SOLN
8.0000 [IU] | Freq: Once | INTRAMUSCULAR | Status: AC
  Administered 2024-01-27: 8 [IU] via SUBCUTANEOUS
  Filled 2024-01-27: qty 8

## 2024-01-27 MED ORDER — VANCOMYCIN HCL 1250 MG/250ML IV SOLN
1250.0000 mg | Freq: Once | INTRAVENOUS | Status: AC
  Administered 2024-01-27: 1250 mg via INTRAVENOUS
  Filled 2024-01-27: qty 250

## 2024-01-27 MED ORDER — PIPERACILLIN-TAZOBACTAM IN DEX 2-0.25 GM/50ML IV SOLN
2.2500 g | Freq: Three times a day (TID) | INTRAVENOUS | Status: DC
  Administered 2024-01-27 – 2024-01-29 (×7): 2.25 g via INTRAVENOUS
  Filled 2024-01-27 (×13): qty 50

## 2024-01-27 MED ORDER — INSULIN REGULAR(HUMAN) IN NACL 100-0.9 UT/100ML-% IV SOLN
INTRAVENOUS | Status: AC
  Administered 2024-01-27: 7.5 [IU]/h via INTRAVENOUS
  Filled 2024-01-27: qty 100

## 2024-01-27 MED ORDER — FOLIC ACID 1 MG PO TABS
1.0000 mg | ORAL_TABLET | Freq: Every day | ORAL | Status: DC
  Administered 2024-01-27 – 2024-01-29 (×3): 1 mg via ORAL
  Filled 2024-01-27 (×3): qty 1

## 2024-01-27 MED ORDER — INSULIN GLARGINE-YFGN 100 UNIT/ML ~~LOC~~ SOLN
5.0000 [IU] | Freq: Every day | SUBCUTANEOUS | Status: DC
  Administered 2024-01-27: 5 [IU] via SUBCUTANEOUS
  Filled 2024-01-27: qty 0.05

## 2024-01-27 MED ORDER — ACETAMINOPHEN 325 MG PO TABS
650.0000 mg | ORAL_TABLET | Freq: Four times a day (QID) | ORAL | Status: DC
  Administered 2024-01-27 – 2024-01-29 (×7): 650 mg via ORAL
  Filled 2024-01-27 (×7): qty 2

## 2024-01-27 MED ORDER — DEXTROSE IN LACTATED RINGERS 5 % IV SOLN: INTRAVENOUS | Status: DC

## 2024-01-27 MED ORDER — LACTATED RINGERS IV SOLN: INTRAVENOUS | Status: DC

## 2024-01-27 MED ORDER — OXYCODONE HCL 5 MG PO TABS
2.5000 mg | ORAL_TABLET | Freq: Four times a day (QID) | ORAL | Status: DC | PRN
  Administered 2024-01-27 – 2024-01-29 (×5): 2.5 mg via ORAL
  Filled 2024-01-27 (×6): qty 1

## 2024-01-27 MED ORDER — INSULIN ASPART 100 UNIT/ML IJ SOLN: 0.0000 [IU] | Freq: Every day | INTRAMUSCULAR | Status: DC

## 2024-01-27 MED ORDER — INSULIN ASPART 100 UNIT/ML IJ SOLN: 0.0000 [IU] | Freq: Three times a day (TID) | INTRAMUSCULAR | Status: DC

## 2024-01-27 MED ORDER — DEXTROSE 50 % IV SOLN: 0.0000 mL | INTRAVENOUS | Status: DC | PRN

## 2024-01-27 MED ORDER — INSULIN GLARGINE-YFGN 100 UNIT/ML ~~LOC~~ SOPN
5.0000 [IU] | PEN_INJECTOR | SUBCUTANEOUS | Status: DC
Start: 2024-01-27 — End: 2024-01-27
  Filled 2024-01-27: qty 3

## 2024-01-27 MED ORDER — GUAIFENESIN ER 600 MG PO TB12
600.0000 mg | ORAL_TABLET | Freq: Two times a day (BID) | ORAL | Status: DC
  Administered 2024-01-27 (×2): 600 mg via ORAL
  Filled 2024-01-27 (×2): qty 1

## 2024-01-27 MED ORDER — VANCOMYCIN VARIABLE DOSE PER UNSTABLE RENAL FUNCTION (PHARMACIST DOSING): Status: DC

## 2024-01-27 MED ORDER — INSULIN REGULAR(HUMAN) IN NACL 100-0.9 UT/100ML-% IV SOLN: INTRAVENOUS | Status: DC

## 2024-01-27 MED ORDER — DOCUSATE SODIUM 100 MG PO CAPS: 100.0000 mg | ORAL_CAPSULE | Freq: Two times a day (BID) | ORAL | Status: DC | PRN

## 2024-01-27 MED ORDER — ENOXAPARIN SODIUM 30 MG/0.3ML IJ SOSY
30.0000 mg | PREFILLED_SYRINGE | INTRAMUSCULAR | Status: AC
  Administered 2024-01-27 – 2024-01-28 (×2): 30 mg via SUBCUTANEOUS
  Filled 2024-01-27 (×2): qty 0.3

## 2024-01-27 MED ORDER — INSULIN ASPART 100 UNIT/ML IJ SOLN: 2.0000 [IU] | Freq: Three times a day (TID) | INTRAMUSCULAR | Status: DC

## 2024-01-27 MED ORDER — POLYETHYLENE GLYCOL 3350 17 G PO PACK: 17.0000 g | PACK | Freq: Every day | ORAL | Status: DC | PRN

## 2024-01-27 NOTE — Inpatient Diabetes Management (Signed)
 Inpatient Diabetes Program Recommendations  AACE/ADA: New Consensus Statement on Inpatient Glycemic Control (2015)  Target Ranges:  Prepandial:   less than 140 mg/dL      Peak postprandial:   less than 180 mg/dL (1-2 hours)      Critically ill patients:  140 - 180 mg/dL   Lab Results  Component Value Date   GLUCAP 321 (H) 01/27/2024   HGBA1C 9.1 (H) 01/26/2024    Review of Glycemic Control  Latest Reference Range & Units 01/26/24 05:01 01/26/24 07:36 01/26/24 11:03 01/26/24 16:01 01/26/24 21:48 01/27/24 06:41  Glucose-Capillary 70 - 99 mg/dL 756 (H) 808 (H) 855 (H) 223 (H) 228 (H) 321 (H)   Diabetes history: DM 2 Outpatient Diabetes medications:  Novolog  3-5 units tid with meals Tresiba  2-3 units daily Current orders for Inpatient glycemic control:  Novolog  0-20 units tid with meals and HS Novolog  5 units tid with meals Inpatient Diabetes Program Recommendations:      Please reduce Novolog  correction to sensitive tid with meals (0-9 units) tid with meals and HS, and add Novolog  2 units tid with meals (meal coverage-hold if patient eats less than 50%). She appears to need very low dose basal insulin - Consider adding Semglee  5 units daily.   Thanks,   Randall Bullocks, RN, BC-ADM Inpatient Diabetes Coordinator Pager [phone]  (8a-5p)

## 2024-01-27 NOTE — Progress Notes (Signed)
 Progress Note  Patient Name: Shelby Walker Date of Encounter: 01/27/2024  Primary Cardiologist: Aleene Passe, MD (Inactive)   Patient ID  Shelby Walker is a 89 y.o. female with sinus arrest s/p PPM (01/2022) c/b MSSA bacteremia and septic shock s/p device removal (01/29/22), nonobstructive CAD who presents with LOC with associated sinus arrest.   Subjective  Last night hypotension so started on epi for both blood pressure and heart rate support.  TVP at 90 right now with no underlying escape.  Threshold 2.0 MA from 1.5 MA yesterday.  Completely dependent on TVP.  She has a rhonchorous cough and is not able to get much out in terms of sputum.  She denies any urinary symptoms.  Foley placed yesterday for retention of 600 cc and because she was going to likely be in bed for the next few days.  Inpatient Medications    Scheduled Meds:  amiodarone   100 mg Oral Daily   atorvastatin   20 mg Oral Daily   Chlorhexidine  Gluconate Cloth  6 each Topical Daily   feeding supplement  237 mL Oral BID BM   insulin  aspart  0-9 Units Subcutaneous TID WC   pantoprazole   40 mg Oral Daily   predniSONE   5 mg Oral Q breakfast   sodium chloride  flush  3 mL Intravenous Q12H   sodium chloride  flush  3 mL Intravenous Q12H   Continuous Infusions:  sodium chloride  10 mL/hr at 01/27/24 0500   epinephrine  2.5 mcg/min (01/27/24 0500)   PRN Meds: acetaminophen , ondansetron  (ZOFRAN ) IV, mouth rinse, oxyCODONE , sodium chloride  flush, sodium chloride  flush   Vital Signs    Vitals:   01/27/24 0530 01/27/24 0540 01/27/24 0545 01/27/24 0600  BP: (!) 114/37 (!) 115/50 91/70 (!) 114/97  Pulse: 79 80 70 89  Resp: (!) 23 18 18 17   Temp:      TempSrc:      SpO2: 96% 96% 96% 94%  Weight:      Height:        Intake/Output Summary (Last 24 hours) at 01/27/2024 0609 Last data filed at 01/27/2024 0500 Gross per 24 hour  Intake 3086.13 ml  Output 887 ml  Net 2199.13 ml   Filed Weights   01/26/24 0500 01/26/24  1809  Weight: 52.7 kg 57.2 kg    Telemetry    VP 90 - Personally Reviewed  ECG   None today   Physical Exam   GEN: No acute distress.   Neck: No JVD Cardiac: RRR, no murmurs, rubs, or gallops.  Respiratory: b/l lower lungs rhonchi  GI: Soft, nontender, non-distended  MS: No edema; No deformity. Neuro:  Nonfocal  Skin: RIJ T wire without erythema or bleeding  Psych: Normal affect   Labs    Chemistry Recent Labs  Lab 01/26/24 0000 01/26/24 1926 01/27/24 0410  NA 141 139 138  K 4.5 4.9 4.9  CL 104 105 103  CO2 27 26 21*  GLUCOSE 234* 198* 320*  BUN 36* 41* 43*  CREATININE 1.39* 2.29* 2.37*  CALCIUM  8.3* 7.2* 7.5*  PROT 5.8*  --   --   ALBUMIN  3.0*  --   --   AST 31  --   --   ALT 24  --   --   ALKPHOS 52  --   --   BILITOT 0.9  --   --   GFRNONAA 36* 20* 19*  ANIONGAP 10 8 14     Hematology Recent Labs  Lab 01/26/24 0000 01/26/24  1926 01/27/24 0410  WBC 10.2 10.4 16.6*  RBC 2.99* 2.53* 2.74*  HGB 9.5* 8.0* 8.7*  HCT 30.2* 26.0* 28.0*  MCV 101.0* 102.8* 102.2*  MCH 31.8 31.6 31.8  MCHC 31.5 30.8 31.1  RDW 16.1* 16.8* 17.0*  PLT 173 126* 158   Radiology    ECHOCARDIOGRAM COMPLETE Result Date: 01/26/2024    ECHOCARDIOGRAM REPORT   Patient Name:   Shelby Walker Date of Exam: 01/26/2024 Medical Rec #:  [id number]   Height:       62.0 in Accession #:    [id number]  Weight:       116.2 lb Date of Birth:  1934-01-07   BSA:          1.518 m Patient Age:    89 years    BP:           91/40 mmHg Patient Gender: F           HR:           80 bpm. Exam Location:  Inpatient Procedure: 2D Echo, Cardiac Doppler, Color Doppler and Intracardiac            Opacification Agent (Both Spectral and Color Flow Doppler were            utilized during procedure). Indications:    I44.2 Complete heart block  History:        Patient has prior history of Echocardiogram examinations, most                 recent 01/29/2022. Risk Factors:Hypertension, Diabetes and                  Dyslipidemia.  Sonographer:    Damien Senior RDCS Referring Phys: [id number] VINCENT A DELGADO  Sonographer Comments: Scanned supine on temporary pacemaker IMPRESSIONS  1. Left ventricular ejection fraction, by estimation, is >75%. The left ventricle has hyperdynamic function. The left ventricle has no regional wall motion abnormalities. There is mild concentric left ventricular hypertrophy. Left ventricular diastolic parameters are consistent with Grade I diastolic dysfunction (impaired relaxation). Elevated left ventricular end-diastolic pressure.  2. Right ventricular systolic function is normal. The right ventricular size is normal. There is mildly elevated pulmonary artery systolic pressure. The estimated right ventricular systolic pressure is 40.2 mmHg.  3. Left atrial size was severely dilated.  4. Right atrial size was moderately dilated.  5. The mitral valve is degenerative. Trivial mitral valve regurgitation. Mild mitral stenosis. The mean mitral valve gradient is 4.0 mmHg with average heart rate of 80 bpm.  6. The aortic valve is tricuspid. Aortic valve regurgitation is not visualized. Aortic valve sclerosis/calcification is present, without any evidence of aortic stenosis.  7. The inferior vena cava is dilated in size with <50% respiratory variability, suggesting right atrial pressure of 15 mmHg. FINDINGS  Left Ventricle: Left ventricular ejection fraction, by estimation, is >75%. The left ventricle has hyperdynamic function. The left ventricle has no regional wall motion abnormalities. Definity  contrast agent was given IV to delineate the left ventricular endocardial borders. The left ventricular internal cavity size was normal in size. There is mild concentric left ventricular hypertrophy. Left ventricular diastolic parameters are consistent with Grade I diastolic dysfunction (impaired relaxation). Elevated left ventricular end-diastolic pressure. Right Ventricle: The right ventricular size is normal.  Right ventricular systolic function is normal. There is mildly elevated pulmonary artery systolic pressure. The tricuspid regurgitant velocity is 2.51 m/s, and with an assumed right atrial pressure of 15 mmHg, the estimated  right ventricular systolic pressure is 40.2 mmHg. Left Atrium: Left atrial size was severely dilated. Right Atrium: Right atrial size was moderately dilated. Pericardium: There is no evidence of pericardial effusion. Presence of epicardial fat layer. Mitral Valve: The mitral valve is degenerative in appearance. Mild mitral annular calcification. Trivial mitral valve regurgitation. Mild mitral valve stenosis. MV peak gradient, 8.4 mmHg. The mean mitral valve gradient is 4.0 mmHg with average heart rate of 80 bpm. Tricuspid Valve: The tricuspid valve is normal in structure. Tricuspid valve regurgitation is mild . No evidence of tricuspid stenosis. Aortic Valve: The aortic valve is tricuspid. Aortic valve regurgitation is not visualized. Aortic valve sclerosis/calcification is present, without any evidence of aortic stenosis. Pulmonic Valve: The pulmonic valve was grossly normal. Pulmonic valve regurgitation is trivial. No evidence of pulmonic stenosis. Aorta: The aortic root and ascending aorta are structurally normal, with no evidence of dilitation. Venous: The inferior vena cava is dilated in size with less than 50% respiratory variability, suggesting right atrial pressure of 15 mmHg. IAS/Shunts: The atrial septum is grossly normal.  LEFT VENTRICLE PLAX 2D LVIDd:         3.10 cm   Diastology LVIDs:         2.20 cm   LV e' medial:   4.79 cm/s LV PW:         1.10 cm   LV E/e' medial: 26.1 LV IVS:        1.00 cm LVOT diam:     1.80 cm LV SV:         52 LV SV Index:   34 LVOT Area:     2.53 cm  RIGHT VENTRICLE RV S prime:     17.00 cm/s TAPSE (M-mode): 1.9 cm LEFT ATRIUM             Index        RIGHT ATRIUM           Index LA diam:        4.15 cm 2.73 cm/m   RA Area:     20.70 cm LA Vol (A2C):    68.0 ml 44.81 ml/m  RA Volume:   67.10 ml  44.21 ml/m LA Vol (A4C):   68.6 ml 45.20 ml/m LA Biplane Vol: 71.7 ml 47.24 ml/m  AORTIC VALVE LVOT Vmax:   91.60 cm/s LVOT Vmean:  73.100 cm/s LVOT VTI:    0.205 m  AORTA Ao Root diam: 2.53 cm Ao Asc diam:  2.98 cm MITRAL VALVE                TRICUSPID VALVE MV Area (PHT): 2.53 cm     TR Peak grad:   25.2 mmHg MV Area VTI:   1.45 cm     TR Vmax:        251.00 cm/s MV Peak grad:  8.4 mmHg MV Mean grad:  4.0 mmHg     SHUNTS MV Vmax:       1.45 m/s     Systemic VTI:  0.20 m MV Vmean:      90.0 cm/s    Systemic Diam: 1.80 cm MV Decel Time: 300 msec MV E velocity: 125.00 cm/s MV A velocity: 158.00 cm/s MV E/A ratio:  0.79 Redell Shallow MD Electronically signed by Redell Shallow MD Signature Date/Time: 01/26/2024/12:08:19 PM    Final    DG Chest 1 View Result Date: 01/26/2024 CLINICAL DATA:  [id number] PNA (pneumonia) [id number] EXAM: CHEST  1 VIEW COMPARISON:  January 26, 2024 FINDINGS: The cardiomediastinal silhouette is unchanged in contour.Atherosclerotic calcifications of the aorta. RIGHT neck approach pacing device tip terminating over the expected area of the RIGHT ventricle. Similar LEFT pleural blunting. No pneumothorax. Bibasilar platelike opacities most consistent with atelectasis. No acute pleuroparenchymal abnormality. IMPRESSION: No acute cardiopulmonary abnormality. Electronically Signed   By: Corean Salter M.D.   On: 01/26/2024 09:51   CARDIAC CATHETERIZATION Result Date: 01/26/2024 Temporary pacemaker implantation for complete heart block 01/26/2024: Under fluoroscopic guidance and micropuncture technique, right IJ utilized and a 6 French sheath introduced followed by balloontipped pacemaker insertion into the RV apex with excellent capture at 0.1 mA.  Pacemaker left at 10 mA at VVI 70 bpm.  Patient is in atrial tachycardia and presently has not needed pacing.   CT CHEST ABDOMEN PELVIS WO CONTRAST Result Date: 01/26/2024 EXAM: CT CHEST, ABDOMEN  AND PELVIS WITHOUT CONTRAST 01/26/2024 02:20:33 AM TECHNIQUE: CT of the chest, abdomen and pelvis was performed without the administration of intravenous contrast. Multiplanar reformatted images are provided for review. Automated exposure control, iterative reconstruction, and/or weight based adjustment of the mA/kV was utilized to reduce the radiation dose to as low as reasonably achievable. COMPARISON: 04/03/2023 CLINICAL HISTORY: Recent syncope. FINDINGS: CHEST: MEDIASTINUM AND LYMPH NODES: Thoracic aorta and branches show atherosclerotic calcification. No aneurysmal dilatation is seen. Lack of IV contrast precludes evaluation for dissection. The heart is not significantly enlarged. Pulmonary artery is not enlarged. Coronary calcifications are seen. The esophagus is within normal limits. No mediastinal, hilar or axillary lymphadenopathy. The thoracic inlet is within normal limits. LUNGS AND PLEURA: A tiny 3 mm nodule is noted in the left upper lobe. This is stable from a prior exam from 2023 and felt to be benign in etiology. No other significant pulmonary nodule is noted. Minimal left basilar atelectasis is seen. No focal effusion or pneumothorax is seen. ABDOMEN AND PELVIS: LIVER: The liver shows evidence of pneumobilia likely related to a prior procedure. No ductal dilatation is seen. GALLBLADDER AND BILE DUCTS: The gallbladder has been surgically removed. No biliary ductal dilatation. SPLEEN: The spleen is within normal limits. PANCREAS: The pancreas is within normal limits. ADRENAL GLANDS: Adrenal glands are unremarkable. KIDNEYS, URETERS AND BLADDER: Cystic lesions are again seen in the kidneys bilaterally, stable dating back to 2023 and felt to be benign in etiology. No further follow-up is recommended. No renal calculi or obstructive changes are seen. The bladder is well distended. GI AND BOWEL: Stomach and small bowel are unremarkable. No obstructive or inflammatory changes of the colon are seen.  Significant sigmoid diverticulosis is noted without diverticulitis. Scattered fecal material is noted throughout the colon without obstructive change. The appendix is within normal limits. REPRODUCTIVE ORGANS: The uterus and adnexa are within normal limits. PERITONEUM AND RETROPERITONEUM: No free fluid is seen. No free air. VASCULATURE: Abdominal aortic calcifications are seen without aneurysmal dilatation. ABDOMINAL AND PELVIS LYMPH NODES: No lymphadenopathy is noted. BONES AND SOFT TISSUES: No acute rib abnormality is noted. Degenerative changes of the thoracic spine are seen. Changes of prior vertebral augmentation at L1 are noted. Degenerative changes of the lumbar spine are seen. No focal soft tissue abnormality. IMPRESSION: 1. No acute findings in the chest, abdomen, and pelvis related to syncope. 2. Stable 3 mm left upper lobe pulmonary nodule, benign, no follow-up recommended per prior stability. 3. Significant sigmoid diverticulosis without diverticulitis. Electronically signed by: Oneil Devonshire MD 01/26/2024 02:31 AM EST RP Workstation: [id number] Chest Port 1 View Result Date: 01/26/2024 EXAM:  1 VIEW(S) XRAY OF THE CHEST 01/26/2024 12:58:00 AM COMPARISON: 08/08/2022. CLINICAL HISTORY: fall FINDINGS: LINES, TUBES AND DEVICES: Loop recorder is noted on the left. LUNGS AND PLEURA: The lungs are well aerated bilaterally. No focal pulmonary opacity. No pleural effusion. No pneumothorax. HEART AND MEDIASTINUM: Cardiac shadow is stable. Aortic calcifications are again seen. BONES AND SOFT TISSUES: No acute osseous abnormality. Old Right 7th rib fracture is noted posteriorly. IMPRESSION: 1. No acute cardiopulmonary process. Electronically signed by: Oneil Devonshire MD 01/26/2024 01:09 AM EST RP Workstation: [id number]   CT MAXILLOFACIAL WO CONTRAST Result Date: 01/26/2024 EXAM: CT OF THE FACE WITHOUT CONTRAST 01/26/2024 12:26:00 AM TECHNIQUE: CT of the face was performed without the administration of  intravenous contrast. Multiplanar reformatted images are provided for review. Automated exposure control, iterative reconstruction, and/or weight based adjustment of the mA/kV was utilized to reduce the radiation dose to as low as reasonably achievable. COMPARISON: None available. CLINICAL HISTORY: Facial trauma, blunt Facial trauma, blunt FINDINGS: FACIAL BONES: No acute facial fracture. No mandibular dislocation. No suspicious bone lesion. ORBITS: Globes are intact. No acute traumatic injury. No inflammatory change. SINUSES AND MASTOIDS: No acute abnormality. SOFT TISSUES: No acute abnormality. IMPRESSION: 1. No acute facial fracture. Electronically signed by: Franky Stanford MD 01/26/2024 12:57 AM EST RP Workstation: [id number]   CT CERVICAL SPINE WO CONTRAST Result Date: 01/26/2024 EXAM: CT CERVICAL SPINE WITHOUT CONTRAST 01/26/2024 12:26:00 AM TECHNIQUE: CT of the cervical spine was performed without the administration of intravenous contrast. Multiplanar reformatted images are provided for review. Automated exposure control, iterative reconstruction, and/or weight based adjustment of the mA/kV was utilized to reduce the radiation dose to as low as reasonably achievable. COMPARISON: None available. CLINICAL HISTORY: Neck trauma (Age >= 65y) FINDINGS: CERVICAL SPINE: BONES AND ALIGNMENT: Grade 1 anterolisthesis at C3-C4 and grade 1 retrolisthesis at C4-C5. No acute fracture. DEGENERATIVE CHANGES: No significant degenerative changes. SOFT TISSUES: No prevertebral soft tissue swelling. IMPRESSION: 1. No acute abnormality of the cervical spine related to neck trauma. 2. Grade 1 anterolisthesis at C3-4 and grade 1 retrolisthesis at C4-5. Electronically signed by: Franky Stanford MD 01/26/2024 12:56 AM EST RP Workstation: [id number]   CT HEAD WO CONTRAST ( ) Result Date: 01/26/2024 EXAM: CT HEAD WITHOUT CONTRAST 01/26/2024 12:26:00 AM TECHNIQUE: CT of the head was performed without the administration of  intravenous contrast. Automated exposure control, iterative reconstruction, and/or weight based adjustment of the mA/kV was utilized to reduce the radiation dose to as low as reasonably achievable. COMPARISON: 07/22/2018 CLINICAL HISTORY: Head trauma, minor (Age >= 65y) FINDINGS: BRAIN AND VENTRICLES: Slightly increased size of right parafalcine anterior meningioma measuring 0.9 x 0.9 x 1.1 cm, previously. No acute intracranial hemorrhage. Periventricular white matter changes, likely sequela of chronic small vessel ischemic disease. No evidence of acute infarct. No hydrocephalus. No extra-axial collection. No mass effect or midline shift. ORBITS: Bilateral lens replacements. SINUSES: No acute abnormality. SOFT TISSUES AND SKULL: Left parieto-occipital scalp hematoma. No skull fracture. IMPRESSION: 1. No acute intracranial abnormality related to the head trauma. 2. Left parieto-occipital scalp hematoma. 3. Slightly increased size of right parafalcine anterior meningioma, now measuring 0.9 x 0.9 x 1.1 cm. 4. Periventricular white matter changes, likely sequela of chronic small vessel ischemic disease. Electronically signed by: Franky Stanford MD 01/26/2024 12:53 AM EST RP Workstation: [id number]    Cardiac Studies   None today   Assessment & Plan   DHIYA SMITS is a 89 y.o. female with sinus arrest s/p PPM (01/2022) c/b MSSA bacteremia and septic  shock s/p device removal (01/29/22), nonobstructive CAD who presents with LOC with associated sinus arrest.   CHB Syncope Continues to require significant V pacing this morning with no underlying escape.  VP 90 overnight.  On epi 2.5 mcg/kg/min which was started last night for hypotension with labs showing AKI consistent with possible hypoperfusion and MAPs in the low 50s.  She will need permanent device at this point and I would strongly preference leadless PPM with her fevers now and potential sepsis.  Will plan for this on Wednesday.  Sepsis  Possible aspiration  PNA  Cough, semiproductive and fever all consistent with possible pneumonia.  She had potential aspiration after she was placed on Isuprel  and had emesis as well as with her arrest.  Foley placed yesterday for urinary retention however she was asymptomatic from urinary complaints.  Fever and now along with increasing lactate and AKI on CKD.  Ordered Bcx x2 and then start vanc/zosyn  for sepsis.  Give 500 cc IV fluid now and recheck labs this afternoon.  She is mentating fine and has no complaints other than respiratory symptoms.  For questions or updates, please contact [facility] Please consult [email or web address] for contact info under Cardiology/STEMI.   Signed,  Donnice DELENA Primus, MD [facility]  Cardiac Electrophysiology  01/27/2024, 6:09 AM

## 2024-01-27 NOTE — Progress Notes (Addendum)
 Advanced Heart Failure Rounding Note  Cardiologist: Aleene Passe, MD (Inactive)  Chief Complaint: CHB   Patient Profile   89 y.o. female with sinus arrest s/p PPM (01/2022) c/b MSSA bacteremia and septic shock s/p device removal (01/29/22), now admitted for syncope d/t sinus arrest/ CHB. Now w/ placement of TVP and developing septic shock.   Subjective:    Febrile overnight mTemp 102.4, WBC 10>>16K. LA 2.7  Foley placed for urinary retention. SCr 1.39>>2.29>>2.37  Now on EPi for BP and HR support. Fleeta + Zosyn  ordered. Bcx collected.   Remains pacer dependent, no junctional escape.   Has occasional chest discomfort. No dyspnea.    Objective:   Weight Range: 57.2 kg Body mass index is 23.83 kg/m.   Vital Signs:   Temp:  [98.7 F (37.1 C)-102.4 F (39.1 C)] 102.4 F (39.1 C) (11/24 0734) Pulse Rate:  [50-95] 89 (11/24 0745) Resp:  [12-24] 23 (11/24 0745) BP: (63-150)/(29-129) 111/80 (11/24 0734) SpO2:  [88 %-100 %] 95 % (11/24 0745) Weight:  [57.2 kg] 57.2 kg (11/23 1809) Last BM Date : 01/26/24  Weight change: Filed Weights   01/26/24 0500 01/26/24 1809  Weight: 52.7 kg 57.2 kg    Intake/Output:   Intake/Output Summary (Last 24 hours) at 01/27/2024 0818 Last data filed at 01/27/2024 0700 Gross per 24 hour  Intake 3513.24 ml  Output 887 ml  Net 2626.24 ml      Physical Exam     GENERAL: elderly F, NAD Lungs- clear CARDIAC:  JVP not elevated          Normal rate with regular rhythm. No MRG, no LEE  ABDOMEN: Soft, non-tender, non-distended.  EXTREMITIES: Warm and well perfused.  NEUROLOGIC: No obvious FND   Telemetry   V paced 90 bpm, no junctional escape personally reviewed   EKG    N/A   Labs    CBC Recent Labs    01/26/24 0000 01/26/24 1926 01/27/24 0410  WBC 10.2 10.4 16.6*  NEUTROABS 8.1*  --   --   HGB 9.5* 8.0* 8.7*  HCT 30.2* 26.0* 28.0*  MCV 101.0* 102.8* 102.2*  PLT 173 126* 158   Basic Metabolic Panel Recent  Labs    01/26/24 1926 01/27/24 0410  NA 139 138  K 4.9 4.9  CL 105 103  CO2 26 21*  GLUCOSE 198* 320*  BUN 41* 43*  CREATININE 2.29* 2.37*  CALCIUM  7.2* 7.5*   Liver Function Tests Recent Labs    01/26/24 0000  AST 31  ALT 24  ALKPHOS 52  BILITOT 0.9  PROT 5.8*  ALBUMIN  3.0*   No results for input(s): LIPASE, AMYLASE in the last 72 hours. Cardiac Enzymes No results for input(s): CKTOTAL, CKMB, CKMBINDEX, TROPONINI in the last 72 hours.  BNP: BNP (last 3 results) No results for input(s): BNP in the last 8760 hours.  ProBNP (last 3 results) No results for input(s): PROBNP in the last 8760 hours.   D-Dimer No results for input(s): DDIMER in the last 72 hours. Hemoglobin A1C Recent Labs    01/26/24 0806  HGBA1C 9.1*   Fasting Lipid Panel No results for input(s): CHOL, HDL, LDLCALC, TRIG, CHOLHDL, LDLDIRECT in the last 72 hours. Thyroid  Function Tests No results for input(s): TSH, T4TOTAL, T3FREE, THYROIDAB in the last 72 hours.  Invalid input(s): FREET3  Other results:   Imaging    ECHOCARDIOGRAM COMPLETE Result Date: 01/26/2024    ECHOCARDIOGRAM REPORT   Patient Name:   Shelby Walker  Date of Exam: 01/26/2024 Medical Rec #:  [id number]   Height:       62.0 in Accession #:    [id number]  Weight:       116.2 lb Date of Birth:  [birth year]   BSA:          1.518 m Patient Age:    89 years    BP:           91/40 mmHg Patient Gender: F           HR:           80 bpm. Exam Location:  Inpatient Procedure: 2D Echo, Cardiac Doppler, Color Doppler and Intracardiac            Opacification Agent (Both Spectral and Color Flow Doppler were            utilized during procedure). Indications:    I44.2 Complete heart block  History:        Patient has prior history of Echocardiogram examinations, most                 recent 01/29/2022. Risk Factors:Hypertension, Diabetes and                 Dyslipidemia.  Sonographer:    Damien Senior RDCS  Referring Phys: [id number] VINCENT A DELGADO  Sonographer Comments: Scanned supine on temporary pacemaker IMPRESSIONS  1. Left ventricular ejection fraction, by estimation, is >75%. The left ventricle has hyperdynamic function. The left ventricle has no regional wall motion abnormalities. There is mild concentric left ventricular hypertrophy. Left ventricular diastolic parameters are consistent with Grade I diastolic dysfunction (impaired relaxation). Elevated left ventricular end-diastolic pressure.  2. Right ventricular systolic function is normal. The right ventricular size is normal. There is mildly elevated pulmonary artery systolic pressure. The estimated right ventricular systolic pressure is 40.2 mmHg.  3. Left atrial size was severely dilated.  4. Right atrial size was moderately dilated.  5. The mitral valve is degenerative. Trivial mitral valve regurgitation. Mild mitral stenosis. The mean mitral valve gradient is 4.0 mmHg with average heart rate of 80 bpm.  6. The aortic valve is tricuspid. Aortic valve regurgitation is not visualized. Aortic valve sclerosis/calcification is present, without any evidence of aortic stenosis.  7. The inferior vena cava is dilated in size with <50% respiratory variability, suggesting right atrial pressure of 15 mmHg. FINDINGS  Left Ventricle: Left ventricular ejection fraction, by estimation, is >75%. The left ventricle has hyperdynamic function. The left ventricle has no regional wall motion abnormalities. Definity  contrast agent was given IV to delineate the left ventricular endocardial borders. The left ventricular internal cavity size was normal in size. There is mild concentric left ventricular hypertrophy. Left ventricular diastolic parameters are consistent with Grade I diastolic dysfunction (impaired relaxation). Elevated left ventricular end-diastolic pressure. Right Ventricle: The right ventricular size is normal. Right ventricular systolic function is normal. There  is mildly elevated pulmonary artery systolic pressure. The tricuspid regurgitant velocity is 2.51 m/s, and with an assumed right atrial pressure of 15 mmHg, the estimated right ventricular systolic pressure is 40.2 mmHg. Left Atrium: Left atrial size was severely dilated. Right Atrium: Right atrial size was moderately dilated. Pericardium: There is no evidence of pericardial effusion. Presence of epicardial fat layer. Mitral Valve: The mitral valve is degenerative in appearance. Mild mitral annular calcification. Trivial mitral valve regurgitation. Mild mitral valve stenosis. MV peak gradient, 8.4 mmHg. The mean mitral valve gradient is 4.0 mmHg with average heart  rate of 80 bpm. Tricuspid Valve: The tricuspid valve is normal in structure. Tricuspid valve regurgitation is mild . No evidence of tricuspid stenosis. Aortic Valve: The aortic valve is tricuspid. Aortic valve regurgitation is not visualized. Aortic valve sclerosis/calcification is present, without any evidence of aortic stenosis. Pulmonic Valve: The pulmonic valve was grossly normal. Pulmonic valve regurgitation is trivial. No evidence of pulmonic stenosis. Aorta: The aortic root and ascending aorta are structurally normal, with no evidence of dilitation. Venous: The inferior vena cava is dilated in size with less than 50% respiratory variability, suggesting right atrial pressure of 15 mmHg. IAS/Shunts: The atrial septum is grossly normal.  LEFT VENTRICLE PLAX 2D LVIDd:         3.10 cm   Diastology LVIDs:         2.20 cm   LV e' medial:   4.79 cm/s LV PW:         1.10 cm   LV E/e' medial: 26.1 LV IVS:        1.00 cm LVOT diam:     1.80 cm LV SV:         52 LV SV Index:   34 LVOT Area:     2.53 cm  RIGHT VENTRICLE RV S prime:     17.00 cm/s TAPSE (M-mode): 1.9 cm LEFT ATRIUM             Index        RIGHT ATRIUM           Index LA diam:        4.15 cm 2.73 cm/m   RA Area:     20.70 cm LA Vol (A2C):   68.0 ml 44.81 ml/m  RA Volume:   67.10 ml  44.21  ml/m LA Vol (A4C):   68.6 ml 45.20 ml/m LA Biplane Vol: 71.7 ml 47.24 ml/m  AORTIC VALVE LVOT Vmax:   91.60 cm/s LVOT Vmean:  73.100 cm/s LVOT VTI:    0.205 m  AORTA Ao Root diam: 2.53 cm Ao Asc diam:  2.98 cm MITRAL VALVE                TRICUSPID VALVE MV Area (PHT): 2.53 cm     TR Peak grad:   25.2 mmHg MV Area VTI:   1.45 cm     TR Vmax:        251.00 cm/s MV Peak grad:  8.4 mmHg MV Mean grad:  4.0 mmHg     SHUNTS MV Vmax:       1.45 m/s     Systemic VTI:  0.20 m MV Vmean:      90.0 cm/s    Systemic Diam: 1.80 cm MV Decel Time: 300 msec MV E velocity: 125.00 cm/s MV A velocity: 158.00 cm/s MV E/A ratio:  0.79 Redell Shallow MD Electronically signed by Redell Shallow MD Signature Date/Time: 01/26/2024/12:08:19 PM    Final    DG Chest 1 View Result Date: 01/26/2024 CLINICAL DATA:  [id number] PNA (pneumonia) [id number] EXAM: CHEST  1 VIEW COMPARISON:  January 26, 2024 FINDINGS: The cardiomediastinal silhouette is unchanged in contour.Atherosclerotic calcifications of the aorta. RIGHT neck approach pacing device tip terminating over the expected area of the RIGHT ventricle. Similar LEFT pleural blunting. No pneumothorax. Bibasilar platelike opacities most consistent with atelectasis. No acute pleuroparenchymal abnormality. IMPRESSION: No acute cardiopulmonary abnormality. Electronically Signed   By: Corean Salter M.D.   On: 01/26/2024 09:51     Medications:  Scheduled Medications:  amiodarone   100 mg Oral Daily   atorvastatin   20 mg Oral Daily   Chlorhexidine  Gluconate Cloth  6 each Topical Daily   feeding supplement  237 mL Oral BID BM   insulin  aspart  0-9 Units Subcutaneous TID WC   pantoprazole   40 mg Oral Daily   predniSONE   5 mg Oral Q breakfast   sodium chloride  flush  3 mL Intravenous Q12H   sodium chloride  flush  3 mL Intravenous Q12H   vancomycin  variable dose per unstable renal function (pharmacist dosing)   Does not apply See admin instructions    Infusions:  sodium chloride   10 mL/hr at 01/27/24 0700   epinephrine  2.5 mcg/min (01/27/24 0700)   piperacillin -tazobactam (ZOSYN )  IV     vancomycin       PRN Medications: acetaminophen , ondansetron  (ZOFRAN ) IV, mouth rinse, oxyCODONE , sodium chloride  flush, sodium chloride  flush    Assessment/Plan   1. CHB - pacer dependent, no junctional escape, c/w TVP  - EP planning leadless PPM once medically stable given infection risk  - continue Epi for BP/HR support   2. Septic Shock  - Febrile overnight, mTemp 102, hypotensive, LA 2.7, WBC now 16K - continue Epi for BP support  - Treat w/ Vanc + Zosyn  - follow BCx data - UA ordered   3. AKI  - SCr 1.39>>2.29>>2.37 - multifactorial in setting of shock/hypotension + urinary retention  - support hemodynamics w/ EPI + TVP - abx + foley per above - check UA/UCx - follow BMP closely   4. Chest Pain  - known h/o nonobstructive CAD>> check troponin  - ? Musculoskeletal post chest compression>>PRN tramadol    CRITICAL CARE Performed by: Caffie Shed   Total critical care time: 15 minutes  Critical care time was exclusive of separately billable procedures and treating other patients.  Critical care was necessary to treat or prevent imminent or life-threatening deterioration.  Critical care was time spent personally by me on the following activities: development of treatment plan with patient and/or surrogate as well as nursing, discussions with consultants, evaluation of patient's response to treatment, examination of patient, obtaining history from patient or surrogate, ordering and performing treatments and interventions, ordering and review of laboratory studies, ordering and review of radiographic studies, pulse oximetry and re-evaluation of patient's condition.     Length of Stay: 1  Caffie Shed, PA-C  01/27/2024, 8:18 AM  Advanced Heart Failure Team Pager [phone] (M-F; 7a - 5p)  Please contact [facility] for night-coverage after  hours (5p -7a ) and weekends on [email or web address]  Agree with above  Developed septic physiology overnight. Now on epi and broad spectrum abx. Bck and UA drawn   Continues with intermittent CHB with no junctional escape. TVP working well  Chest sore from CPR  General:  Sitting up in bed. Elderly. Uncomfortable HEENT: normal Neck: supple. RIJ TVP  Cor: Regular rate & rhythm. No rubs, gallops or murmurs. Lungs: clear Abdomen: soft, nontender, nondistended.Good bowel sounds. Extremities: no cyanosis, clubbing, rash, edema  R MTA  diffuse arthritic changes Neuro: alert & orientedx3, cranial nerves grossly intact. moves all 4 extremities w/o difficulty. Affect pleasant  Continues with periods of CHB without junctional escape. Continue TVP. Leadless dual chamber pacer planned later this week  Continue to treat sepsis.   CP from CPR. Pain control   CRITICAL CARE Performed by: Cherrie Sieving  Total critical care time: 39 minutes  Critical care time was exclusive of separately billable procedures and treating other  patients.  Critical care was necessary to treat or prevent imminent or life-threatening deterioration.  Critical care was time spent personally by me (independent of midlevel providers or residents) on the following activities: development of treatment plan with patient and/or surrogate as well as nursing, discussions with consultants, evaluation of patient's response to treatment, examination of patient, obtaining history from patient or surrogate, ordering and performing treatments and interventions, ordering and review of laboratory studies, ordering and review of radiographic studies, pulse oximetry and re-evaluation of patient's condition.  Toribio Fuel, MD  11:53 AM

## 2024-01-27 NOTE — Progress Notes (Signed)
 Pharmacy Antibiotic Note  Shelby Walker is a 89 y.o. female admitted on 01/25/2024 with complete heart block s/p temporary pacemaker placement.  Pharmacy has been consulted for zosyn /vancomycin  dosing for sepsis concerns due to aspiration PNA. PMH of PPM that was c/b MSSA bacteremia and removed 3 weeks after in 2023 and ascending cholangitis with pans-sensitive enterobacter cloacae bacteremia.  -WBC 16, sCr 2.37 (bl~1.3), Tmax 101.8 -Blood cultures ordered; MRSA PCR negative -CXR: Bibasilar opacities most consistent with atelectasis   Plan: -Zosyn  2.25gm IV every 8 hours -Vancomycin  1250mg  IV x1 -Vancomycin  variable given AKI -Monitor renal function for dose adjustments given AKI -Follow up signs of clinical improvement, LOT, de-escalation of antibiotics   Height: 5' 1 (154.9 cm) Weight: 57.2 kg (126 lb 1.7 oz) IBW/kg (Calculated) : 47.8  Temp (24hrs), Avg:100.2 F (37.9 C), Min:98.7 F (37.1 C), Max:101.8 F (38.8 C)  Recent Labs  Lab 01/26/24 0000 01/26/24 1926 01/26/24 1933 01/27/24 0410  WBC 10.2 10.4  --  16.6*  CREATININE 1.39* 2.29*  --  2.37*  LATICACIDVEN  --   --  1.8 2.7*    Estimated Creatinine Clearance: 12.4 mL/min (A) (by C-G formula based on SCr of 2.37 mg/dL (H)).    Antimicrobials this admission: Zosyn  11/24 >>  Vancomycin  11/24 >>    Microbiology results: 11/24 BCx:  11/24 MRSA PCR: negative  Thank you for allowing pharmacy to be a part of this patient's care.  Lynwood Poplar, PharmD, BCPS Clinical Pharmacist 01/27/2024 6:32 AM

## 2024-01-27 NOTE — Progress Notes (Signed)
 Afternoon round: Stable today. Was OOB in chair. Switched from epi to vaso which is also weaning to off now. Plan for leadless pacemaker Wednesday.

## 2024-01-27 NOTE — Plan of Care (Signed)
  Problem: Elimination: Goal: Will not experience complications related to bowel motility Outcome: Progressing Goal: Will not experience complications related to urinary retention Outcome: Progressing   Problem: Pain Managment: Goal: General experience of comfort will improve and/or be controlled Outcome: Progressing   Problem: Safety: Goal: Ability to remain free from injury will improve Outcome: Progressing   Problem: Cardiovascular: Goal: Vascular access site(s) Level 0-1 will be maintained Outcome: Progressing   Problem: Nutritional: Goal: Maintenance of adequate nutrition will improve Outcome: Not Progressing

## 2024-01-27 NOTE — Plan of Care (Signed)
   Problem: Education: Goal: Knowledge of General Education information will improve Description Including pain rating scale, medication(s)/side effects and non-pharmacologic comfort measures Outcome: Progressing   Problem: Health Behavior/Discharge Planning: Goal: Ability to manage health-related needs will improve Outcome: Progressing

## 2024-01-27 NOTE — Telephone Encounter (Signed)
 Auth Submission: NO AUTH NEEDED Site of care: [facility] Payer: MEDICARE A/B, [employer] Medication & CPT/J Code(s) submitted: Prolia  (Denosumab ) [id number] Diagnosis Code: M81.0 Route of submission (phone, fax, portal):  Phone # Fax # Auth type: Buy/Bill HB Units/visits requested: 60mg  x 2 doses, q 6 months Reference number:  Approval from: 01/27/2024 to 04/04/24    Dagoberto Armour, CPhT [facility] Phone: [phone] 01/27/2024

## 2024-01-27 NOTE — Consult Note (Addendum)
 NAME:  Shelby Walker, MRN:  [id number], DOB:  1933-11-06, LOS: 1 ADMISSION DATE:  01/25/2024, CONSULTATION DATE:  11/24 REFERRING MD: Bensimhon, AHF, CHIEF COMPLAINT: fever   History of Present Illness:  89 year old female with past medical history of diabetes, GERD, RA, CKD, hyperlipidemia, previous PPM 2023 complicated by MSSA bactermia s/p removal 01/29/22 who presented to ED on 11/23 with syncope found to have CHB and long sinus arrest. In the ED, she became unresponsive and CPR was initiated for about 20-30 seconds. Cardiology was consulted and recommended starting on isoproterenol . After initiation she had episode of large emesis. She continued to have long sinus pauses and was taken to cath lab for TVP placement. On 11/24, CCM consulted for fevers, leukocytosis, concern for development of septic shock.   Pertinent  Medical History  diabetes, GERD, RA, CKD, hyperlipidemia, previous PPM 2023 complicated by MSSA bactermia s/p removal 01/29/22  Significant Hospital Events: Including procedures, antibiotic start and stop dates in addition to other pertinent events   11/23: admit for syncope, CHB s/p TVP; had episode of asystole req 20-30 sec chest compressions. Likely aspirated during emesis  11/24: CCM consult for septic shock   Interim History / Subjective:  On exam, daughter at bedside. Endorses story as above. Patient denies illness prior to admission. No fevers or chills at home. She is complaining of mid chest pain.   Objective   Blood pressure 125/61, pulse 89, temperature (!) 101.5 F (38.6 C), resp. rate (!) 21, height 5' 1 (1.549 m), weight 57.2 kg, SpO2 95%.        Intake/Output Summary (Last 24 hours) at 01/27/2024 1109 Last data filed at 01/27/2024 1000 Gross per 24 hour  Intake 3369.56 ml  Output 1012 ml  Net 2357.56 ml   Filed Weights   01/26/24 0500 01/26/24 1809  Weight: 52.7 kg 57.2 kg    Examination: General: elderly female, laying in bed, no acute distress   HENT: perrla, ncat, anicteric sclera, mm dry  Lungs: expiratory rhonchi bilaterally, on 1L [place], no wheezing, resp even and unlabored  Cardiovascular: TVP R internal jugular, paced rhythm, epi @ 2, no edema, warm  Abdomen: rounded, soft  Extremities: R metatarsal amputation, no edema  Neuro: awake, alert, oriented  GU: foley, yellow urine   Resolved Hospital Problem list    Assessment & Plan:  Septic shock 2/2 likely aspiration pneumonia  Emesis episode in ED peri arrest now with cough, leukocytosis and fever. - repeat CXR this morning  - MRSA PCR negative - can probably dc vancomycin  - will re-eval tomorrow as we are still workin up  - sputum cx, blood cultures, UA  - vanc/zosyn   - on epi for hypotension - difficult to discern if this is related to CHB or developing septic shock. She was hypotensive despite TVP placement so may have septic component  - tylenol  prn  - monitor fever, wbc curve; lactic may be misleading in setting of cardiogenic shock   Acute hypoxic respiratory failure 2/2 aspiration pneumonia  - requiring low dose O2 - titrate to sat >92%  - IS and flutter valve  - vanc/zosyn   - add mucinex  to thin secretions - having a hard time coughing up   CHB  Syncope  - EP and AHF on board, appreciate management  - TVP currently, no underlying escape  - epi 2.5mcg/kg/min - amiodarone  100mg  daily  - currently plan for leadless PPM Wednesday   Chest pain  Likely from chest compressions  - prn tylenol ,  tramadol , oxy - AHF checking troponin to be sure   AKI on CKD  Baseline sCr 1.2-1.5? 1.39>2.29>2.37; likely pre-renal hypoperfusion related  - trend bmp, mag, phos - replete elytes - strict I&O - check UA  - Avoid nephrotoxic agents, renally dose medications - ensure adequate renal perfusion   Diabetes  On chronic prednisone  for RA   - increase insulin  ssi  - novolog  2U TID with meals  - cbg QID with meals   Hyperlipidemia  - atorvastatin  20mg  daily   GERD   - ppi   Rheumatoid arthritis  On chronic prednisone , methotrexate and lyrica  - prednisone  5 daily  - no methotrexate at this time  - monitor for any withdraw symptoms on lyrica  DVT: ppx lovenox   On diet   GOC: re-discussed code status with patient and daughter. Received CPR in ED but presented DNR. Confirmed patient is a DNR/DNI. Bracelet is on.  Labs   CBC: Recent Labs  Lab 01/26/24 0000 01/26/24 1926 01/27/24 0410  WBC 10.2 10.4 16.6*  NEUTROABS 8.1*  --   --   HGB 9.5* 8.0* 8.7*  HCT 30.2* 26.0* 28.0*  MCV 101.0* 102.8* 102.2*  PLT 173 126* 158    Basic Metabolic Panel: Recent Labs  Lab 01/26/24 0000 01/26/24 1926 01/27/24 0410  NA 141 139 138  K 4.5 4.9 4.9  CL 104 105 103  CO2 27 26 21*  GLUCOSE 234* 198* 320*  BUN 36* 41* 43*  CREATININE 1.39* 2.29* 2.37*  CALCIUM  8.3* 7.2* 7.5*   GFR: Estimated Creatinine Clearance: 12.4 mL/min (A) (by C-G formula based on SCr of 2.37 mg/dL (H)). Recent Labs  Lab 01/26/24 0000 01/26/24 1926 01/26/24 1933 01/27/24 0410  WBC 10.2 10.4  --  16.6*  LATICACIDVEN  --   --  1.8 2.7*    Liver Function Tests: Recent Labs  Lab 01/26/24 0000  AST 31  ALT 24  ALKPHOS 52  BILITOT 0.9  PROT 5.8*  ALBUMIN  3.0*   No results for input(s): LIPASE, AMYLASE in the last 168 hours. No results for input(s): AMMONIA in the last 168 hours.  ABG    Component Value Date/Time   HCO3 22.7 01/24/2022 0936   TCO2 24 01/24/2022 0936   ACIDBASEDEF 3.0 (H) 01/24/2022 0936   O2SAT 62.2 01/25/2022 0920     Coagulation Profile: No results for input(s): INR, PROTIME in the last 168 hours.  Cardiac Enzymes: No results for input(s): CKTOTAL, CKMB, CKMBINDEX, TROPONINI in the last 168 hours.  HbA1C: Hgb A1c MFr Bld  Date/Time Value Ref Range Status  01/26/2024 08:06 AM 9.1 (H) 4.8 - 5.6 % Final    Comment:    (NOTE) Diagnosis of Diabetes The following HbA1c ranges recommended by the American Diabetes  Association (ADA) may be used as an aid in the diagnosis of diabetes mellitus.  Hemoglobin             Suggested A1C NGSP%              Diagnosis  <5.7                   Non Diabetic  5.7-6.4                Pre-Diabetic  >6.4                   Diabetic  <7.0  Glycemic control for                       adults with diabetes.    04/04/2023 04:14 AM 9.2 (H) 4.8 - 5.6 % Final    Comment:    (NOTE) Pre diabetes:          5.7%-6.4%  Diabetes:              >6.4%  Glycemic control for   <7.0% adults with diabetes     CBG: Recent Labs  Lab 01/26/24 0736 01/26/24 1103 01/26/24 1601 01/26/24 2148 01/27/24 0641  GLUCAP 191* 144* 223* 228* 321*    Review of Systems:   As above  Past Medical History:  She,  has a past medical history of Anemia, Bruit, C. difficile colitis (07/2018), Chest pain, unspecified, Chronic kidney disease, Diabetes mellitus, Diverticulosis, Dyslipidemia, Dysrhythmia, Ejection fraction, GERD (gastroesophageal reflux disease), History of kidney stones, HLD (hyperlipidemia), Implantable loop recorder present, Memory loss, Mitral regurgitation (04/21/2009), Osteoporosis, Palpitations, Pneumonia, PONV (postoperative nausea and vomiting), Psoriasis, and Rheumatoid arthritis (HCC).   Surgical History:   Past Surgical History:  Procedure Laterality Date   ABDOMINAL AORTOGRAM W/LOWER EXTREMITY Right 10/19/2020   Procedure: ABDOMINAL AORTOGRAM W/LOWER EXTREMITY;  Surgeon: Darron Deatrice LABOR, MD;  Location: [facility];  Service: Cardiovascular;  Laterality: Right;   CHOLECYSTECTOMY N/A 08/09/2022   Procedure: LAPAROSCOPIC CHOLECYSTECTOMY WITH INTRAOPERATIVE CHOLANGIOGRAM;  Surgeon: Lyndel Deward PARAS, MD;  Location: [facility];  Service: General;  Laterality: N/A;   ENDOSCOPIC RETROGRADE CHOLANGIOPANCREATOGRAPHY (ERCP) WITH PROPOFOL  N/A 04/05/2023   Procedure: ENDOSCOPIC RETROGRADE CHOLANGIOPANCREATOGRAPHY (ERCP) WITH PROPOFOL ;  Surgeon:  Abran Norleen SAILOR, MD;  Location: [facility];  Service: Gastroenterology;  Laterality: N/A;   FRACTURE SURGERY     KYPHOPLASTY N/A 04/25/2023   Procedure: LUMBAR 1 KYPHOPLASTY;  Surgeon: Beuford Anes, MD;  Location: [facility];  Service: Orthopedics;  Laterality: N/A;   LOOP Recorder placement     LOOP RECORDER REMOVAL N/A 01/05/2022   Procedure: LOOP RECORDER REMOVAL;  Surgeon: Waddell Danelle ORN, MD;  Location: [facility];  Service: Cardiovascular;  Laterality: N/A;   PACEMAKER IMPLANT N/A 01/05/2022   Procedure: [employer] PACEMAKER IMPLANT;  Surgeon: Waddell Danelle ORN, MD;  Location: [facility];  Service: Cardiovascular;  Laterality: N/A;   PPM GENERATOR REMOVAL N/A 01/29/2022   Procedure: PPM GENERATOR REMOVAL;  Surgeon: Waddell Danelle ORN, MD;  Location: [facility];  Service: Cardiovascular;  Laterality: N/A;   REMOVAL OF STONES  04/05/2023   Procedure: REMOVAL OF STONES;  Surgeon: Abran Norleen SAILOR, MD;  Location: [facility];  Service: Gastroenterology;;   ANNETT  04/05/2023   Procedure: ANNETT;  Surgeon: Abran Norleen SAILOR, MD;  Location: [facility];  Service: Gastroenterology;;   TEE WITHOUT CARDIOVERSION N/A 01/29/2022   Procedure: TRANSESOPHAGEAL ECHOCARDIOGRAM (TEE);  Surgeon: Barbaraann Darryle Ned, MD;  Location: [facility];  Service: Cardiovascular;  Laterality: N/A;   TEMPORARY PACEMAKER N/A 01/26/2024   Procedure: TEMPORARY PACEMAKER;  Surgeon: Ladona Heinz, MD;  Location: [facility];  Service: Cardiovascular;  Laterality: N/A;   TRANSMETATARSAL AMPUTATION Right 01/18/2021   Procedure: TRANSMETATARSAL AMPUTATION;  Surgeon: Kit Norleen, MD;  Location: [facility] OR;  Service: Orthopedics;  Laterality: Right;     Social History:   reports that she has never smoked. She has never used smokeless tobacco. She reports that she does not drink alcohol  and does not use drugs.   Family History:  Her family  history includes Diabetes in her father and mother; Heart  attack in her father and mother; Heart failure in her father and mother; Stroke in her sister.   Allergies Allergies  Allergen Reactions   Cholestyramine      Possible- Makes throat burn. Patient said she can't take it    Compazine [Prochlorperazine Edisylate] Swelling    REACTION:  tongue swell and unable to swallow   Celecoxib Diarrhea   Sulfonamide Derivatives Other (See Comments)    REACTION:  broke out with fine itching bumps   Hydrocodone  Nausea Only   Infliximab Other (See Comments)    rash   Leflunomide Other (See Comments)    diarrhea   Prochlorperazine Other (See Comments)    Other reaction(s): Unknown  Other Reaction(s): tongue swells   Rosuvastatin     muscle aches   Sulfa Antibiotics Other (See Comments)    tongue swelling     Home Medications  Prior to Admission medications   Medication Sig Start Date End Date Taking? Authorizing Provider  acetaminophen  (TYLENOL ) 325 MG tablet Take 650 mg by mouth every 6 (six) hours as needed for moderate pain (pain score 4-6).   Yes [provider]  amiodarone  (PACERONE ) 200 MG tablet Take 0.5 tablets (100 mg total) by mouth daily. 06/18/23  Yes Fernande Elspeth BROCKS, MD  atorvastatin  (LIPITOR) 20 MG tablet Take 1 tablet by mouth once daily 08/14/23  Yes Klein, Steven C, MD  Calcium  Carbonate-Vitamin D 600-400 MG-UNIT tablet Take 1 tablet by mouth daily at 12 noon.    Yes [provider]  denosumab  (PROLIA ) 60 MG/ML SOSY injection Inject 60 mg into the skin every 6 (six) months.   Yes [provider]  diclofenac  Sodium (VOLTAREN ) 1 % GEL Apply 4 g topically 4 (four) times daily. Patient taking differently: Apply 4 g topically 4 (four) times daily as needed (for pain). 02/15/22  Yes Setzer, Nena PARAS, PA-C  esomeprazole  (NEXIUM ) 20 MG capsule Take 20 mg by mouth daily.   Yes [provider]  folic acid  (FOLVITE ) 1 MG tablet Take 1 mg by mouth daily.   Yes [provider]  insulin  aspart  (NOVOLOG ) 100 UNIT/ML FlexPen Inject 2 Units into the skin 3 (three) times daily with meals. Sliding scale Patient taking differently: Inject 3-5 Units into the skin 3 (three) times daily with meals. Sliding scale 02/15/22  Yes Setzer, Nena PARAS, PA-C  Insulin  Degludec (TRESIBA ) 100 UNIT/ML SOLN Inject 4 Units into the skin at bedtime. Patient taking differently: Inject 2-3 Units into the skin at bedtime. 02/15/22  Yes Setzer, Nena PARAS, PA-C  iron polysaccharides (NIFEREX) 150 MG capsule Take 150 mg by mouth daily.   Yes [provider]  lidocaine  (LIDODERM ) 5 % Place 1 patch onto the skin daily. Remove & Discard patch within 12 hours or as directed by MD Patient taking differently: Place 1 patch onto the skin daily as needed (for pain). Remove & Discard patch within 12 hours or as directed by MD 04/07/23  Yes Gherghe, Costin M, MD  methocarbamol  (ROBAXIN ) 500 MG tablet Take 1-2 tablets (500-1,000 mg total) by mouth every 6 (six) hours as needed for muscle spasms. 04/25/23  Yes McKenzie, Kayla J, PA-C  methotrexate 50 MG/2ML injection Inject 0.4 mLs into the muscle once a week. 07/26/22  Yes [provider]  mirtazapine  (REMERON ) 7.5 MG tablet Take 7.5 mg by mouth at bedtime.   Yes [provider]  Multiple Vitamins-Minerals (CENTRUM SILVER  50+WOMEN PO) Take 1  tablet by mouth daily.   Yes [provider]  nitroGLYCERIN  (NITRODUR - DOSED IN MG/24 HR) 0.2 mg/hr patch Place 1 patch (0.2 mg total) onto the skin daily. 04/15/23  Yes Harden Jerona GAILS, MD  predniSONE  (DELTASONE ) 5 MG tablet Take 1 tablet (5 mg total) by mouth daily with breakfast. 02/15/22  Yes Setzer, Nena PARAS, PA-C  pregabalin (LYRICA) 50 MG capsule Take 50 mg by mouth in the morning.   Yes [provider]  pregabalin (LYRICA) 75 MG capsule Take 75 mg by mouth at bedtime.   Yes [provider]  protein supplement shake (PREMIER PROTEIN) LIQD Take 11 oz by mouth 2 (two) times daily.   Yes  [provider]  pyrithione zinc  (SELSUN BLUE DRY SCALP) 1 % shampoo Apply 1 application topically once a week. 04/15/15  Yes [provider]  RESTASIS  0.05 % ophthalmic emulsion Place 1 drop into both eyes 2 (two) times daily. 02/13/19  Yes [provider]  nitroGLYCERIN  (NITROSTAT ) 0.4 MG SL tablet Dissolve 1 tablet under the tongue every 5 minutes as needed for chest pain. Max of 3 doses, then 911. Patient not taking: Reported on 01/26/2024 09/05/23   Nahser, Aleene PARAS, MD     Critical care time: 60   The patient is critically ill with multiple organ system failure and requires high complexity decision making for assessment and support, frequent evaluation and titration of therapies, advanced monitoring, review of radiographic studies and interpretation of complex data.    Critical Care Time devoted to patient care services, exclusive of separately billable procedures, described in this note is 40  Tinnie FORBES Adolph DEVONNA [facility] 01/27/24 11:43 AM  Please see [email or web address] for pager details.  From 7A-7P if no response, please call [phone] After hours, please call ELink [phone]

## 2024-01-28 DIAGNOSIS — N179 Acute kidney failure, unspecified: Secondary | ICD-10-CM | POA: Diagnosis not present

## 2024-01-28 DIAGNOSIS — A419 Sepsis, unspecified organism: Secondary | ICD-10-CM | POA: Diagnosis not present

## 2024-01-28 DIAGNOSIS — I5030 Unspecified diastolic (congestive) heart failure: Secondary | ICD-10-CM | POA: Diagnosis not present

## 2024-01-28 DIAGNOSIS — I442 Atrioventricular block, complete: Secondary | ICD-10-CM | POA: Diagnosis not present

## 2024-01-28 DIAGNOSIS — R6521 Severe sepsis with septic shock: Secondary | ICD-10-CM | POA: Diagnosis not present

## 2024-01-28 LAB — BASIC METABOLIC PANEL WITH GFR
Anion gap: 13 (ref 5–15)
BUN: 41 mg/dL — ABNORMAL HIGH (ref 8–23)
CO2: 21 mmol/L — ABNORMAL LOW (ref 22–32)
Calcium: 7.3 mg/dL — ABNORMAL LOW (ref 8.9–10.3)
Chloride: 101 mmol/L (ref 98–111)
Creatinine, Ser: 1.76 mg/dL — ABNORMAL HIGH (ref 0.44–1.00)
GFR, Estimated: 27 mL/min — ABNORMAL LOW (ref >60)
Glucose, Bld: 148 mg/dL — ABNORMAL HIGH (ref 70–99)
Potassium: 4.6 mmol/L (ref 3.5–5.1)
Sodium: 135 mmol/L (ref 135–145)

## 2024-01-28 LAB — SURGICAL PCR SCREEN
MRSA, PCR: NEGATIVE
Staphylococcus aureus: NEGATIVE

## 2024-01-28 LAB — GLUCOSE, CAPILLARY
Glucose-Capillary: 126 mg/dL — ABNORMAL HIGH (ref 70–99)
Glucose-Capillary: 131 mg/dL — ABNORMAL HIGH (ref 70–99)
Glucose-Capillary: 140 mg/dL — ABNORMAL HIGH (ref 70–99)
Glucose-Capillary: 143 mg/dL — ABNORMAL HIGH (ref 70–99)
Glucose-Capillary: 147 mg/dL — ABNORMAL HIGH (ref 70–99)
Glucose-Capillary: 153 mg/dL — ABNORMAL HIGH (ref 70–99)
Glucose-Capillary: 155 mg/dL — ABNORMAL HIGH (ref 70–99)
Glucose-Capillary: 155 mg/dL — ABNORMAL HIGH (ref 70–99)
Glucose-Capillary: 163 mg/dL — ABNORMAL HIGH (ref 70–99)
Glucose-Capillary: 165 mg/dL — ABNORMAL HIGH (ref 70–99)
Glucose-Capillary: 190 mg/dL — ABNORMAL HIGH (ref 70–99)

## 2024-01-28 LAB — CBC
HCT: 25.2 % — ABNORMAL LOW (ref 36.0–46.0)
Hemoglobin: 8.1 g/dL — ABNORMAL LOW (ref 12.0–15.0)
MCH: 32 pg (ref 26.0–34.0)
MCHC: 32.1 g/dL (ref 30.0–36.0)
MCV: 99.6 fL (ref 80.0–100.0)
Platelets: 126 K/uL — ABNORMAL LOW (ref 150–400)
RBC: 2.53 MIL/uL — ABNORMAL LOW (ref 3.87–5.11)
RDW: 17 % — ABNORMAL HIGH (ref 11.5–15.5)
WBC: 11 K/uL — ABNORMAL HIGH (ref 4.0–10.5)
nRBC: 0 % (ref 0.0–0.2)

## 2024-01-28 LAB — URINE CULTURE

## 2024-01-28 LAB — VANCOMYCIN, RANDOM: Vancomycin Rm: 11 ug/mL

## 2024-01-28 LAB — LIPOPROTEIN A (LPA): Lipoprotein (a): 16.8 nmol/L (ref <75.0)

## 2024-01-28 LAB — LACTIC ACID, PLASMA: Lactic Acid, Venous: 1.7 mmol/L (ref 0.5–1.9)

## 2024-01-28 MED ORDER — INSULIN GLARGINE-YFGN 100 UNIT/ML ~~LOC~~ SOLN
6.0000 [IU] | Freq: Every day | SUBCUTANEOUS | Status: DC
  Administered 2024-01-28 – 2024-01-29 (×2): 6 [IU] via SUBCUTANEOUS
  Filled 2024-01-28 (×2): qty 0.06

## 2024-01-28 MED ORDER — MUPIROCIN 2 % EX OINT
1.0000 | TOPICAL_OINTMENT | Freq: Two times a day (BID) | CUTANEOUS | Status: DC
  Administered 2024-01-28 – 2024-01-29 (×2): 1 via NASAL
  Filled 2024-01-28: qty 22

## 2024-01-28 MED ORDER — INSULIN ASPART 100 UNIT/ML IJ SOLN
3.0000 [IU] | Freq: Three times a day (TID) | INTRAMUSCULAR | Status: DC
  Administered 2024-01-28: 3 [IU] via SUBCUTANEOUS
  Filled 2024-01-28: qty 3

## 2024-01-28 MED ORDER — OXYMETAZOLINE HCL 0.05 % NA SOLN
1.0000 | Freq: Two times a day (BID) | NASAL | Status: DC
  Administered 2024-01-28 – 2024-01-29 (×2): 1 via NASAL
  Filled 2024-01-28: qty 30

## 2024-01-28 MED ORDER — GUAIFENESIN-DM 100-10 MG/5ML PO SYRP
5.0000 mL | ORAL_SOLUTION | ORAL | Status: DC | PRN
Start: 2024-01-28 — End: 2024-01-31
  Administered 2024-01-28 – 2024-01-29 (×3): 5 mL via ORAL
  Filled 2024-01-28 (×3): qty 5

## 2024-01-28 MED ORDER — INSULIN GLARGINE-YFGN 100 UNIT/ML ~~LOC~~ SOLN
5.0000 [IU] | Freq: Every day | SUBCUTANEOUS | Status: DC
  Filled 2024-01-28: qty 0.05

## 2024-01-28 MED ORDER — INSULIN ASPART 100 UNIT/ML IJ SOLN: 0.0000 [IU] | Freq: Every day | INTRAMUSCULAR | Status: DC

## 2024-01-28 MED ORDER — FUROSEMIDE 10 MG/ML IJ SOLN
40.0000 mg | Freq: Once | INTRAMUSCULAR | Status: AC
  Administered 2024-01-28: 40 mg via INTRAVENOUS
  Filled 2024-01-28: qty 4

## 2024-01-28 MED ORDER — INSULIN ASPART 100 UNIT/ML IJ SOLN
0.0000 [IU] | Freq: Three times a day (TID) | INTRAMUSCULAR | Status: DC
  Administered 2024-01-28 (×2): 2 [IU] via SUBCUTANEOUS
  Filled 2024-01-28 (×2): qty 2

## 2024-01-28 NOTE — Progress Notes (Addendum)
 Advanced Heart Failure Rounding Note  Cardiologist: None  Chief Complaint: CHB   Patient Profile   89 y.o. female with sinus arrest s/p PPM (01/2022) c/b MSSA bacteremia and septic shock s/p device removal (01/29/22), now admitted for syncope d/t sinus arrest/ CHB. Now w/ placement of TVP and developing septic shock.   Subjective:    Currently NSR in the 70s w/ intermittent pacing needs.   Remains on Vanc + Zosyn  for suspected aspiration PNA. WBC 16>>11. Fevers resolved. Now off IV pressor support. BP stable. BCx NGTD. UCx pending.   AKI improving, sCr 2.37>>1.76   Denies dyspnea. C/w chest wall soreness from CPR.    Objective:   Weight Range: 57.4 kg Body mass index is 23.91 kg/m.   Vital Signs:   Temp:  [98 F (36.7 C)-102 F (38.9 C)] 99.7 F (37.6 C) (11/25 0700) Pulse Rate:  [86-91] 89 (11/25 0700) Resp:  [12-23] 22 (11/25 0700) BP: (73-150)/(41-107) 113/56 (11/25 0700) SpO2:  [85 %-100 %] 98 % (11/25 0700) Weight:  [57.4 kg] 57.4 kg (11/25 0700) Last BM Date : 01/27/24  Weight change: Filed Weights   01/26/24 0500 01/26/24 1809 01/28/24 0700  Weight: 52.7 kg 57.2 kg 57.4 kg    Intake/Output:   Intake/Output Summary (Last 24 hours) at 01/28/2024 9185 Last data filed at 01/28/2024 0700 Gross per 24 hour  Intake 2251.45 ml  Output 970 ml  Net 1281.45 ml      Physical Exam    GENERAL: elderly female, NAD, + TVP Rt internal jugular  Lungs- course bilaterally CARDIAC:  JVP 12 cm          Normal rate with regular rhythm. No MRG. Trace b/l LEE  ABDOMEN: Soft, non-tender, non-distended.  EXTREMITIES: Warm and well perfused.  NEUROLOGIC: No obvious FND  Telemetry   Currently NSR 70s personally reviewed   EKG    N/A   Labs    CBC Recent Labs    01/26/24 0000 01/26/24 1926 01/27/24 0410 01/28/24 0607  WBC 10.2   < > 16.6* 11.0*  NEUTROABS 8.1*  --   --   --   HGB 9.5*   < > 8.7* 8.1*  HCT 30.2*   < > 28.0* 25.2*  MCV 101.0*   < >  102.2* 99.6  PLT 173   < > 158 126*   < > = values in this interval not displayed.   Basic Metabolic Panel Recent Labs    88/75/74 0410 01/28/24 0607  NA 138 135  K 4.9 4.6  CL 103 101  CO2 21* 21*  GLUCOSE 320* 148*  BUN 43* 41*  CREATININE 2.37* 1.76*  CALCIUM  7.5* 7.3*   Liver Function Tests Recent Labs    01/26/24 0000  AST 31  ALT 24  ALKPHOS 52  BILITOT 0.9  PROT 5.8*  ALBUMIN  3.0*   No results for input(s): LIPASE, AMYLASE in the last 72 hours. Cardiac Enzymes No results for input(s): CKTOTAL, CKMB, CKMBINDEX, TROPONINI in the last 72 hours.  BNP: BNP (last 3 results) Recent Labs    01/27/24 0647  BNP 1,411.4*    ProBNP (last 3 results) No results for input(s): PROBNP in the last 8760 hours.   D-Dimer No results for input(s): DDIMER in the last 72 hours. Hemoglobin A1C Recent Labs    01/26/24 0806  HGBA1C 9.1*   Fasting Lipid Panel No results for input(s): CHOL, HDL, LDLCALC, TRIG, CHOLHDL, LDLDIRECT in the last 72 hours. Thyroid  Function Tests  No results for input(s): TSH, T4TOTAL, T3FREE, THYROIDAB in the last 72 hours.  Invalid input(s): FREET3  Other results:   Imaging    DG CHEST PORT 1 VIEW Result Date: 01/27/2024 EXAM: 1 VIEW(S) XRAY OF THE CHEST 01/27/2024 12:01:36 PM COMPARISON: 01/26/2024 CLINICAL HISTORY: 200808 Hypoxia 200808 FINDINGS: LINES, TUBES AND DEVICES: Single lead pacer. Loop recorder. LUNGS AND PLEURA: Similar tiny bilateral pleural effusions. Similar mild pulmonary venous congestion. Left greater than right base subsegmental atelectasis. No pneumothorax. HEART AND MEDIASTINUM: Mild cardiomegaly. Transverse aortic atherosclerosis. BONES AND SOFT TISSUES: No acute osseous abnormality. IMPRESSION: 1. No significant change in cardiomegaly, pulmonary venous congestion, and tiny bilateral pleural effusions. 2. Similar bibasilar atelectasis. Electronically signed by: Rockey Kilts MD  01/27/2024 12:40 PM EST RP Workstation: [id number]     Medications:     Scheduled Medications:  acetaminophen   650 mg Oral Q6H   amiodarone   100 mg Oral Daily   atorvastatin   20 mg Oral Daily   Chlorhexidine  Gluconate Cloth  6 each Topical Daily   enoxaparin  (LOVENOX ) injection  30 mg Subcutaneous Q24H   feeding supplement  237 mL Oral BID BM   folic acid   1 mg Oral Daily   oxymetazoline   1 spray Each Nare BID   pantoprazole   40 mg Oral Daily   predniSONE   5 mg Oral Q breakfast   sodium chloride  flush  3 mL Intravenous Q12H   sodium chloride  flush  3 mL Intravenous Q12H   vancomycin  variable dose per unstable renal function (pharmacist dosing)   Does not apply See admin instructions    Infusions:  sodium chloride  Stopped (01/28/24 9357)   dextrose  5% lactated ringers  125 mL/hr at 01/28/24 0713   insulin  0.5 mL/hr at 01/28/24 0700   lactated ringers  Stopped (01/27/24 2225)   piperacillin -tazobactam (ZOSYN )  IV 100 mL/hr at 01/28/24 0700   vasopressin  Stopped (01/28/24 0110)    PRN Medications: dextrose , docusate sodium , guaiFENesin -dextromethorphan , ondansetron  (ZOFRAN ) IV, mouth rinse, oxyCODONE , polyethylene glycol, sodium chloride  flush, sodium chloride  flush, traMADol     Assessment/Plan   1. CHB - h/o sinus arrest s/p PPM (01/2022) c/b MSSA bacteremia and septic shock s/p device removal (01/29/22) - now admitted for syncope d/t sinus arrest/ CHB>>TVP placed  - currently NSR but c/w intermittent pacing needs  - EP planning leadless PPM, anticipate tomorrow    2. Septic Shock - Suspect 2/2 Aspiration PNA - improving w/ abx. Now off IV pressor support, WBC dec and fevers resolved   - Continue Vanc + Zosyn . Appreciated CCM  - BCx NGTD  3. HFpEF  - Echo this admit EF > 75%, RV normal. IVC dilated, assumed RAP ~15.  - Admit BNP 1400, JVD elevated on exam  - Give IV Lasix  40 mg x 1 and follow response   4. AKI  - multifactorial in setting of shock/hypotension +  urinary retention  - Improving, SCr 1.39>>2.29>>2.37>>1.76 - support hemodynamics TVP - cont abx + foley per above - follow BMP closely   5. Chest Pain  - known h/o nonobstructive CAD>> Hs trop, flat/low level not c/w ACS  - Suspect Musculoskeletal post chest compression>>PRN tramadol    CRITICAL CARE Performed by: Caffie Shed   Total critical care time: 15 minutes  Critical care time was exclusive of separately billable procedures and treating other patients.  Critical care was necessary to treat or prevent imminent or life-threatening deterioration.  Critical care was time spent personally by me on the following activities: development of treatment plan with patient and/or  surrogate as well as nursing, discussions with consultants, evaluation of patient's response to treatment, examination of patient, obtaining history from patient or surrogate, ordering and performing treatments and interventions, ordering and review of laboratory studies, ordering and review of radiographic studies, pulse oximetry and re-evaluation of patient's condition.    Length of Stay: 2  Caffie Shed, PA-C  01/28/2024, 8:14 AM  Advanced Heart Failure Team Pager [phone] (M-F; 7a - 5p)  Please contact [facility] for night-coverage after hours (5p -7a ) and weekends on [email or web address]  Agree with above  Continues with periods of CHB -> pacing via TVP   Fevers resolved. Cx NGTD  CP improved  General:  Sitting up in bed. No resp difficulty HEENT: normal Neck: supple. nJVP 7  Cor: Regular rate & rhythm. No rubs, gallops or murmurs. Lungs: clear Abdomen: soft, nontender, nondistended.Good bowel sounds. Extremities: no cyanosis, clubbing, rash, edema diffuse arthritic changes Neuro: alert & orientedx3, cranial nerves grossly intact. moves all 4 extremities w/o difficulty. Affect pleasant  Continues with periods of CHB requiring TVP.   Discussed with her and her son. She will need PPM.  Favor wireless device given previous device infection and recent infection here.   EP following. I d/w EP as well.   Will give one dose lasix  today  CRITICAL CARE Performed by: Corina Stacy  Total critical care time: 45 minutes  Critical care time was exclusive of separately billable procedures and treating other patients.  Critical care was necessary to treat or prevent imminent or life-threatening deterioration.  Critical care was time spent personally by me (independent of midlevel providers or residents) on the following activities: development of treatment plan with patient and/or surrogate as well as nursing, discussions with consultants, evaluation of patient's response to treatment, examination of patient, obtaining history from patient or surrogate, ordering and performing treatments and interventions, ordering and review of laboratory studies, ordering and review of radiographic studies, pulse oximetry and re-evaluation of patient's condition.  Toribio Fuel, MD  1:09 PM

## 2024-01-28 NOTE — TOC Initial Note (Signed)
 Transition of Care [facility]) - Initial/Assessment Note    Patient Details  Name: Shelby Walker MRN: [id number] Date of Birth: Apr 17, 1933  Transition of Care [facility]) CM/SW Contact:    Justina Delcia Czar, RN Phone Number: [phone] 01/28/2024, 1:56 PM  Clinical Narrative:                 Spoke to pt, dtr [name] and son Addie at bedside. Dtr states pt lives alone independent. Pt had [facility] in the past. [facility] if [facility] needed. Has RW, cane and shower bench at home. Pt went to IP rehab in the past.   Will need PT/OT evaluation and recommendations.   Will scheduled PCP hospital follow up appt at dc.   Expected Discharge Plan: Home w Home Health Services Barriers to Discharge: Continued Medical Work up   Patient Goals and CMS Choice Patient states their goals for this hospitalization and ongoing recovery are:: wants to remain independent CMS Medicare.gov Compare Post Acute Care list provided to:: Patient Represenative (must comment) Choice offered to / list presented to : Adult Children      Expected Discharge Plan and Services   Discharge Planning Services: CM Consult Post Acute Care Choice: Home Health                               [facility] Agency: [facility] Date [facility] Agency Contacted: 01/28/24 Time HH Agency Contacted: [phone] Representative spoke with at [facility] Agency: Arna  Prior Living Arrangements/Services   Lives with:: Self Patient language and need for interpreter reviewed:: Yes Do you feel safe going back to the place where you live?: Yes      Need for Family Participation in Patient Care: Yes (Comment) Care giver support system in place?: Yes (comment) Current home services: DME (walker, rollator, shower bench, cane) Criminal Activity/Legal Involvement Pertinent to Current Situation/Hospitalization: No - Comment as needed  Activities of Daily Living   ADL Screening (condition at time of admission) Independently performs ADLs?: No Does the patient have a  NEW difficulty with bathing/dressing/toileting/self-feeding that is expected to last >3 days?: Yes (Initiates electronic notice to provider for possible OT consult) Does the patient have a NEW difficulty with getting in/out of bed, walking, or climbing stairs that is expected to last >3 days?: Yes (Initiates electronic notice to provider for possible PT consult) Does the patient have a NEW difficulty with communication that is expected to last >3 days?: No Is the patient deaf or have difficulty hearing?: Yes Does the patient have difficulty seeing, even when wearing glasses/contacts?: No Does the patient have difficulty concentrating, remembering, or making decisions?: Yes  Permission Sought/Granted Permission sought to share information with : Case Manager, Family Supports, PCP Permission granted to share information with : Yes, Verbal Permission Granted  Share Information with NAME: [name] Ruth  Permission granted to share info w AGENCY: Home Health, DME, PCP  Permission granted to share info w Relationship: daughter  Permission granted to share info w Contact Information: [phone]  Emotional Assessment Appearance:: Appears stated age Attitude/Demeanor/Rapport: Lethargic          Admission diagnosis:  Cardiac syncope [R55] Complete heart block [facility]) [I44.2] Patient Active Problem List   Diagnosis Date Noted   OP (osteoporosis) 01/27/2024   Complete heart block (HCC) 01/26/2024   Elevated LFTs 04/04/2023   Anemia 04/04/2023   Choledocholithiasis 04/03/2023   Acute biliary pancreatitis 08/07/2022   Sepsis (HCC)  08/07/2022   Sacral wound 05/14/2022   Coronary artery disease involving native coronary artery of native heart without angina pectoris 02/21/2022   Discitis of lumbar region 02/05/2022   Herpes zoster without complication 02/05/2022   Clostridium difficile colitis 02/05/2022   Severe sepsis with acute organ dysfunction due to Gram positive bacteria (HCC) 02/02/2022    Pacemaker infection 02/01/2022   Periostitis of lumbar spine without osteomyelitis (HCC) 02/01/2022   Acute midline low back pain without sciatica 01/26/2022   MSSA bacteremia 01/25/2022   Presence of cardiac pacemaker 01/25/2022   Severe sepsis with septic shock (HCC) 01/24/2022   AKI (acute kidney injury) 01/24/2022   Sinus pause 01/04/2022   Acute viral bronchitis 02/06/2021   Acute blood loss anemia    Postoperative pain    S/P transmetatarsal amputation of foot, right (HCC) 01/24/2021   Protein-calorie malnutrition, severe 01/17/2021   Diabetic foot infection (HCC) 01/16/2021   Rheumatoid arthritis (HCC) 01/16/2021   Stage 3b chronic kidney disease (CKD) (HCC) 01/16/2021   DNR (do not resuscitate) 01/16/2021   Decreased anal sphincter tone 12/13/2018   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   Irritable bowel syndrome with diarrhea - post infectious 12/12/2018   Full incontinence of feces and fecal smearing 12/12/2018   Perianal dermatitis 12/12/2018   Uncontrolled type 2 diabetes mellitus with hyperglycemia (HCC) 10/26/2018   Long-term use of immunosuppressant medication-Enbrel 10/26/2018   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 08/27/2018   Thrombocytopenia    Essential hypertension    Dyspnea on exertion    Steroid-induced hyperglycemia    Fatigue 08/06/2018   Paroxysmal atrial fibrillation (HCC)    Mixed hyperlipidemia 09/29/2016   Stable angina 09/29/2016   PAD (peripheral artery disease) 08/09/2016   Preoperative clearance 05/21/2012   Precordial chest pain    Palpitations    Diabetes mellitus (HCC)    GERD (gastroesophageal reflux disease)    Ejection fraction    Carotid artery disease    Mitral regurgitation 04/21/2009   PSORIASIS 02/09/2009   Rheumatoid arthritis flare (HCC) 02/09/2009   PCP:  Nichole Senior, MD Pharmacy:   [id number] - [place], [place] - [address] [place] [address] Phone:  [phone] Fax: [phone]  [facility] [address] [place] - [address] [place] [address] Phone: [phone] Fax: [phone]     Social Drivers of Health (SDOH) Social History: SDOH Screenings   Food Insecurity: No Food Insecurity (01/26/2024)  Housing: Low Risk  (01/26/2024)  Transportation Needs: No Transportation Needs (01/26/2024)  Utilities: Not At Risk (01/26/2024)  Depression (PHQ2-9): Low Risk  (05/28/2022)  Financial Resource Strain: Low Risk  (07/24/2018)  Physical Activity: Sufficiently Active (07/24/2018)  Social Connections: Moderately Isolated (01/26/2024)  Stress: No Stress Concern Present (07/24/2018)  Tobacco Use: Low Risk  (01/26/2024)   SDOH Interventions:     Readmission Risk Interventions    04/05/2023    2:29 PM 02/02/2022    1:25 PM  Readmission Risk Prevention Plan  Transportation Screening Complete   PCP or Specialist Appt within 5-7 Days Complete   Home Care Screening Complete   Medication Review (RN CM) Complete   HRI or Home Care Consult  Complete  Social Work Consult for Recovery Care Planning/Counseling  Complete  Palliative Care Screening  Not Applicable  Medication Review [employer])  Complete

## 2024-01-28 NOTE — Progress Notes (Signed)
 NAME:  Shelby Walker, MRN:  [id number], DOB:  1933/03/08, LOS: 2 ADMISSION DATE:  01/25/2024, CONSULTATION DATE:  11/24 REFERRING MD: Bensimhon, AHF, CHIEF COMPLAINT: fever   History of Present Illness:  89 year old female with past medical history of diabetes, GERD, RA, CKD, hyperlipidemia, previous PPM 2023 complicated by MSSA bactermia s/p removal 01/29/22 who presented to ED on 11/23 with syncope found to have CHB and long sinus arrest. In the ED, she became unresponsive and CPR was initiated for about 20-30 seconds. Cardiology was consulted and recommended starting on isoproterenol . After initiation she had episode of large emesis. She continued to have long sinus pauses and was taken to cath lab for TVP placement. On 11/24, CCM consulted for fevers, leukocytosis, concern for development of septic shock.   Pertinent  Medical History  diabetes, GERD, RA, CKD, hyperlipidemia, previous PPM 2023 complicated by MSSA bactermia s/p removal 01/29/22  Significant Hospital Events: Including procedures, antibiotic start and stop dates in addition to other pertinent events   11/23: admit for syncope, CHB s/p TVP; had episode of asystole req 20-30 sec chest compressions. Likely aspirated during emesis  11/24: CCM consult for septic shock  11/25: shock resolved   Interim History / Subjective:  NAEON; report that she had diminished urine output overnight, still put out 1L yesterday, renal function improving. Pressors off. For PPM 02-16-24  Objective   Blood pressure (!) 113/56, pulse 89, temperature 99.7 F (37.6 C), resp. rate (!) 22, height 5' 1 (1.549 m), weight 57.4 kg, SpO2 98%.        Intake/Output Summary (Last 24 hours) at 01/28/2024 0810 Last data filed at 01/28/2024 0700 Gross per 24 hour  Intake 2251.45 ml  Output 970 ml  Net 1281.45 ml   Filed Weights   01/26/24 0500 01/26/24 1809 01/28/24 0700  Weight: 52.7 kg 57.2 kg 57.4 kg    Examination: General: elderly female, laying in  bed, no acute distress  HENT: perrla, ncat, anicteric sclera, mm moist Lungs: ctab, room air, resp even and unlabored  Cardiovascular: TVP R internal jugular, paced rhythm; warm; chest wall TTP  Abdomen: rounded, soft  Extremities: R transmetatarsal amputation, no edema  Neuro: awake, alert, oriented  GU: foley, yellow urine   Resolved Hospital Problem list    Assessment & Plan:  Urosepsis  Aspiration event  Septic shock, resolved  Emesis episode in ED peri arrest now with cough, leukocytosis and fever. Repeat CXR without any obvious infiltrate. UA + UTI. Ucx pending. MRSA PCR negative.  - follow blood and urine culture data  - will dc vanc; continue zosyn  > de-escalate zosyn  02-16-2024  - tylenol  prn for fevers  - monitor fever, wbc curve > improving  Acute hypoxic respiratory failure 2/2 aspiration pneumonia  - requiring low dose O2 - titrate to sat >92%  - IS and flutter valve, mobilize as able  - zosyn   - add mucinex  to thin secretions - having a hard time coughing up  - repeat sputum culture, previous not enough sample   CHB  Syncope  HFpEF  Echo with EF > 75%, RV normal. BNP 1400 - Lasix  40mg  x1 today  - EP and AHF on board, appreciate management  - TVP currently > decreased V pacing to 80 this morning  - amiodarone  100mg  daily  - currently plan for leadless PPM Wednesday   Chest pain  Likely from chest compressions  - prn tylenol , tramadol , oxy - AHF checking troponin to be sure > low  AKI on CKD  Baseline sCr 1.2-1.5? 1.39>2.29>2.37>1.76 - lasix  40mg  IV once today  - trend bmp, mag, phos - replete elytes - strict I&O  - Avoid nephrotoxic agents, renally dose medications - ensure adequate renal perfusion   Diabetes  On chronic prednisone  for RA   - placed on endotool 11/24 for BG > 400  - transition off endotool this morning to ssi  - cbg q4h   Hyperlipidemia  - atorvastatin  20mg  daily   GERD  - ppi   Rheumatoid arthritis  On chronic prednisone ,  methotrexate and lyrica  - prednisone  5 daily  - no methotrexate at this time  - monitor for any withdraw symptoms on lyrica  DVT: ppx lovenox   On diet   GOC: re-discussed code status with patient and daughter. Received CPR in ED but presented DNR. Confirmed patient is a DNR/DNI. Bracelet is on.  Labs   CBC: Recent Labs  Lab 01/26/24 0000 01/26/24 1926 01/27/24 0410 01/28/24 0607  WBC 10.2 10.4 16.6* 11.0*  NEUTROABS 8.1*  --   --   --   HGB 9.5* 8.0* 8.7* 8.1*  HCT 30.2* 26.0* 28.0* 25.2*  MCV 101.0* 102.8* 102.2* 99.6  PLT 173 126* 158 126*    Basic Metabolic Panel: Recent Labs  Lab 01/26/24 0000 01/26/24 1926 01/27/24 0410 01/28/24 0607  NA 141 139 138 135  K 4.5 4.9 4.9 4.6  CL 104 105 103 101  CO2 27 26 21* 21*  GLUCOSE 234* 198* 320* 148*  BUN 36* 41* 43* 41*  CREATININE 1.39* 2.29* 2.37* 1.76*  CALCIUM  8.3* 7.2* 7.5* 7.3*   GFR: Estimated Creatinine Clearance: 18 mL/min (A) (by C-G formula based on SCr of 1.76 mg/dL (H)). Recent Labs  Lab 01/26/24 0000 01/26/24 1926 01/26/24 1933 01/27/24 0410 01/28/24 0607  WBC 10.2 10.4  --  16.6* 11.0*  LATICACIDVEN  --   --  1.8 2.7*  --     Liver Function Tests: Recent Labs  Lab 01/26/24 0000  AST 31  ALT 24  ALKPHOS 52  BILITOT 0.9  PROT 5.8*  ALBUMIN  3.0*   No results for input(s): LIPASE, AMYLASE in the last 168 hours. No results for input(s): AMMONIA in the last 168 hours.  ABG    Component Value Date/Time   HCO3 22.7 01/24/2022 0936   TCO2 24 01/24/2022 0936   ACIDBASEDEF 3.0 (H) 01/24/2022 0936   O2SAT 62.2 01/25/2022 0920     Coagulation Profile: No results for input(s): INR, PROTIME in the last 168 hours.  Cardiac Enzymes: No results for input(s): CKTOTAL, CKMB, CKMBINDEX, TROPONINI in the last 168 hours.  HbA1C: Hgb A1c MFr Bld  Date/Time Value Ref Range Status  01/26/2024 08:06 AM 9.1 (H) 4.8 - 5.6 % Final    Comment:    (NOTE) Diagnosis of Diabetes The  following HbA1c ranges recommended by the American Diabetes Association (ADA) may be used as an aid in the diagnosis of diabetes mellitus.  Hemoglobin             Suggested A1C NGSP%              Diagnosis  <5.7                   Non Diabetic  5.7-6.4                Pre-Diabetic  >6.4                   Diabetic  <  7.0                   Glycemic control for                       adults with diabetes.    04/04/2023 04:14 AM 9.2 (H) 4.8 - 5.6 % Final    Comment:    (NOTE) Pre diabetes:          5.7%-6.4%  Diabetes:              >6.4%  Glycemic control for   <7.0% adults with diabetes     CBG: Recent Labs  Lab 01/28/24 0044 01/28/24 0147 01/28/24 0302 01/28/24 0518 01/28/24 0724  GLUCAP 153* 163* 155* 126* 143*    Review of Systems:   As above  Past Medical History:  She,  has a past medical history of Anemia, Bruit, C. difficile colitis (07/2018), Chest pain, unspecified, Chronic kidney disease, Diabetes mellitus, Diverticulosis, Dyslipidemia, Dysrhythmia, Ejection fraction, GERD (gastroesophageal reflux disease), History of kidney stones, HLD (hyperlipidemia), Implantable loop recorder present, Memory loss, Mitral regurgitation (04/21/2009), Osteoporosis, Palpitations, Pneumonia, PONV (postoperative nausea and vomiting), Psoriasis, and Rheumatoid arthritis (HCC).   Surgical History:   Past Surgical History:  Procedure Laterality Date   ABDOMINAL AORTOGRAM W/LOWER EXTREMITY Right 10/19/2020   Procedure: ABDOMINAL AORTOGRAM W/LOWER EXTREMITY;  Surgeon: Darron Deatrice LABOR, MD;  Location: [facility]ASIVE [facility];  Service: Cardiovascular;  Laterality: Right;   CHOLECYSTECTOMY N/A 08/09/2022   Procedure: LAPAROSCOPIC CHOLECYSTECTOMY WITH INTRAOPERATIVE CHOLANGIOGRAM;  Surgeon: Lyndel Deward PARAS, MD;  Location: [facility] OR;  Service: General;  Laterality: N/A;   ENDOSCOPIC RETROGRADE CHOLANGIOPANCREATOGRAPHY (ERCP) WITH PROPOFOL  N/A 04/05/2023   Procedure: ENDOSCOPIC RETROGRADE  CHOLANGIOPANCREATOGRAPHY (ERCP) WITH PROPOFOL ;  Surgeon: Abran Norleen SAILOR, MD;  Location: [facility]OSCOPY;  Service: Gastroenterology;  Laterality: N/A;   FRACTURE SURGERY     KYPHOPLASTY N/A 04/25/2023   Procedure: LUMBAR 1 KYPHOPLASTY;  Surgeon: Beuford Anes, MD;  Location: [facility] OR;  Service: Orthopedics;  Laterality: N/A;   LOOP Recorder placement     LOOP RECORDER REMOVAL N/A 01/05/2022   Procedure: LOOP RECORDER REMOVAL;  Surgeon: Waddell Danelle ORN, MD;  Location: [facility];  Service: Cardiovascular;  Laterality: N/A;   PACEMAKER IMPLANT N/A 01/05/2022   Procedure: [employer] PACEMAKER IMPLANT;  Surgeon: Waddell Danelle ORN, MD;  Location: [facility];  Service: Cardiovascular;  Laterality: N/A;   PPM GENERATOR REMOVAL N/A 01/29/2022   Procedure: PPM GENERATOR REMOVAL;  Surgeon: Waddell Danelle ORN, MD;  Location: [facility];  Service: Cardiovascular;  Laterality: N/A;   REMOVAL OF STONES  04/05/2023   Procedure: REMOVAL OF STONES;  Surgeon: Abran Norleen SAILOR, MD;  Location: [facility];  Service: Gastroenterology;;   ANNETT  04/05/2023   Procedure: ANNETT;  Surgeon: Abran Norleen SAILOR, MD;  Location: [facility];  Service: Gastroenterology;;   TEE WITHOUT CARDIOVERSION N/A 01/29/2022   Procedure: TRANSESOPHAGEAL ECHOCARDIOGRAM (TEE);  Surgeon: Barbaraann Darryle Ned, MD;  Location[facility] ENDOSCOPY;  Service: Cardiovascular;  Laterality: N/A;   TEMPORARY PACEMAKER N/A 01/26/2024   Procedure: TEMPORARY PACEMAKER;  Surgeon: Ladona Heinz, MD;  Location: [facility] INVASIVE CV LAB;  Service: Cardiovascular;  Laterality: N/A;   TRANSMETATARSAL AMPUTATION Right 01/18/2021   Procedure: TRANSMETATARSAL AMPUTATION;  Surgeon: Kit Norleen, MD;  Location: [facility] OR;  Service: Orthopedics;  Laterality: Right;     Social History:   reports that she has never smoked. She has never used smokeless tobacco. She  reports that she does not drink alcohol  and does not use drugs.   Family History:  Her family  history includes Diabetes in her father and mother; Heart attack in her father and mother; Heart failure in her father and mother; Stroke in her sister.   Allergies Allergies  Allergen Reactions   Cholestyramine      Possible- Makes throat burn. Patient said she can't take it    Compazine [Prochlorperazine Edisylate] Swelling    REACTION:  tongue swell and unable to swallow   Celecoxib Diarrhea   Sulfonamide Derivatives Other (See Comments)    REACTION:  broke out with fine itching bumps   Hydrocodone  Nausea Only   Infliximab Other (See Comments)    rash   Leflunomide Other (See Comments)    diarrhea   Prochlorperazine Other (See Comments)    Other reaction(s): Unknown  Other Reaction(s): tongue swells   Rosuvastatin     muscle aches   Sulfa Antibiotics Other (See Comments)    tongue swelling     Home Medications  Prior to Admission medications   Medication Sig Start Date End Date Taking? Authorizing Provider  acetaminophen  (TYLENOL ) 325 MG tablet Take 650 mg by mouth every 6 (six) hours as needed for moderate pain (pain score 4-6).   Yes [provider]  amiodarone  (PACERONE ) 200 MG tablet Take 0.5 tablets (100 mg total) by mouth daily. 06/18/23  Yes Fernande Elspeth BROCKS, MD  atorvastatin  (LIPITOR) 20 MG tablet Take 1 tablet by mouth once daily 08/14/23  Yes Fernande Elspeth BROCKS, MD  Calcium  Carbonate-Vitamin D 600-400 MG-UNIT tablet Take 1 tablet by mouth daily at 12 noon.    Yes [provider]  denosumab  (PROLIA ) 60 MG/ML SOSY injection Inject 60 mg into the skin every 6 (six) months.   Yes [provider]  diclofenac  Sodium (VOLTAREN ) 1 % GEL Apply 4 g topically 4 (four) times daily. Patient taking differently: Apply 4 g topically 4 (four) times daily as needed (for pain). 02/15/22  Yes Setzer, Nena PARAS, PA-C  esomeprazole  (NEXIUM ) 20 MG capsule Take 20 mg by mouth daily.   Yes [provider]  folic acid  (FOLVITE ) 1 MG tablet Take 1 mg by mouth  daily.   Yes [provider]  insulin  aspart (NOVOLOG ) 100 UNIT/ML FlexPen Inject 2 Units into the skin 3 (three) times daily with meals. Sliding scale Patient taking differently: Inject 3-5 Units into the skin 3 (three) times daily with meals. Sliding scale 02/15/22  Yes Setzer, Nena PARAS, PA-C  Insulin  Degludec (TRESIBA ) 100 UNIT/ML SOLN Inject 4 Units into the skin at bedtime. Patient taking differently: Inject 2-3 Units into the skin at bedtime. 02/15/22  Yes Setzer, Nena PARAS, PA-C  iron polysaccharides (NIFEREX) 150 MG capsule Take 150 mg by mouth daily.   Yes [provider]  lidocaine  (LIDODERM ) 5 % Place 1 patch onto the skin daily. Remove & Discard patch within 12 hours or as directed by MD Patient taking differently: Place 1 patch onto the skin daily as needed (for pain). Remove & Discard patch within 12 hours or as directed by MD 04/07/23  Yes Gherghe, Costin M, MD  methocarbamol  (ROBAXIN ) 500 MG tablet Take 1-2 tablets (500-1,000 mg total) by mouth every 6 (six) hours as needed for muscle spasms. 04/25/23  Yes McKenzie, Kayla J, PA-C  methotrexate 50 MG/2ML injection Inject 0.4 mLs into the muscle once a week. 07/26/22  Yes [provider]  mirtazapine  (REMERON ) 7.5 MG tablet Take 7.5 mg  by mouth at bedtime.   Yes [provider]  Multiple Vitamins-Minerals (CENTRUM SILVER  50+WOMEN PO) Take 1 tablet by mouth daily.   Yes [provider]  nitroGLYCERIN  (NITRODUR - DOSED IN MG/24 HR) 0.2 mg/hr patch Place 1 patch (0.2 mg total) onto the skin daily. 04/15/23  Yes Harden Jerona GAILS, MD  predniSONE  (DELTASONE ) 5 MG tablet Take 1 tablet (5 mg total) by mouth daily with breakfast. 02/15/22  Yes Setzer, Nena PARAS, PA-C  pregabalin (LYRICA) 50 MG capsule Take 50 mg by mouth in the morning.   Yes [provider]  pregabalin (LYRICA) 75 MG capsule Take 75 mg by mouth at bedtime.   Yes [provider]  protein supplement shake (PREMIER PROTEIN)  LIQD Take 11 oz by mouth 2 (two) times daily.   Yes [provider]  pyrithione zinc  (SELSUN BLUE DRY SCALP) 1 % shampoo Apply 1 application topically once a week. 04/15/15  Yes [provider]  RESTASIS  0.05 % ophthalmic emulsion Place 1 drop into both eyes 2 (two) times daily. 02/13/19  Yes [provider]  nitroGLYCERIN  (NITROSTAT ) 0.4 MG SL tablet Dissolve 1 tablet under the tongue every 5 minutes as needed for chest pain. Max of 3 doses, then 911. Patient not taking: Reported on 01/26/2024 09/05/23   Nahser, Aleene PARAS, MD     Critical care time: 47   The patient is critically ill with multiple organ system failure and requires high complexity decision making for assessment and support, frequent evaluation and titration of therapies, advanced monitoring, review of radiographic studies and interpretation of complex data.    Critical Care Time devoted to patient care services, exclusive of separately billable procedures, described in this note is 36  Tinnie FORBES Adolph DEVONNA [facility] 01/28/24 8:10 AM  Please see [email or web address] for pager details.  From 7A-7P if no response, please call [phone] After hours, please call ELink [phone]

## 2024-01-28 NOTE — Progress Notes (Signed)
  Patient Name: Shelby Walker Date of Encounter: 01/28/2024  Primary Cardiologist: None Electrophysiologist: None  Interval Summary   No new complaints. Off vasopressin .   Tmax24hrs 102.   99.5 this am.  Vital Signs    Vitals:   01/28/24 0600 01/28/24 0615 01/28/24 0630 01/28/24 0645  BP: (!) 107/47 (!) 121/52 (!) 114/53 114/71  Pulse: 89 90 89 89  Resp: 20 (!) 21 20 (!) 22  Temp: 99.5 F (37.5 C) 99.5 F (37.5 C) 99.5 F (37.5 C) 99.5 F (37.5 C)  TempSrc:      SpO2: (!) 88% (!) 85% 93% 98%  Weight:      Height:        Intake/Output Summary (Last 24 hours) at 01/28/2024 0708 Last data filed at 01/28/2024 0432 Gross per 24 hour  Intake 1028.71 ml  Output 1025 ml  Net 3.71 ml   Filed Weights   01/26/24 0500 01/26/24 1809  Weight: 52.7 kg 57.2 kg    Physical Exam    GEN- NAD, Alert and oriented  Lungs- Clear to ausculation bilaterally, normal work of breathing Cardiac- Regular rate and rhythm, no murmurs, rubs or gallops GI- soft, NT, ND, + BS Extremities- no clubbing or cyanosis. No edema  Telemetry    V paced 90s with occasional competitive intrinsic rhythm (personally reviewed)  Hospital Course    Shelby Walker is a 89 y.o. female with sinus arrest s/p PPM (01/2022) c/b MSSA bacteremia and septic shock s/p device removal (01/29/22), nonobstructive CAD who presents with LOC with associated sinus arrest.   Assessment & Plan     CHB Syncope V pacing @ 90 with competitive rhythm at times -> decreased to 80 Threshold 3.5V this am.  Tentatively plan Leadless tomorrow pending course   Sepsis  Possible aspiration PNA  Temp 99.5 this am, Tmax 102 yesterday am.  Continue to follow.     For questions or updates, please contact [facility] Please consult [email or web address] for contact info under     Signed, Ozell Prentice Passey, PA-C  01/28/2024, 7:08 AM

## 2024-01-29 ENCOUNTER — Encounter (HOSPITAL_COMMUNITY): Payer: Self-pay | Admitting: Internal Medicine

## 2024-01-29 ENCOUNTER — Ambulatory Visit

## 2024-01-29 ENCOUNTER — Inpatient Hospital Stay (HOSPITAL_COMMUNITY)

## 2024-01-29 ENCOUNTER — Inpatient Hospital Stay (HOSPITAL_COMMUNITY): Payer: Self-pay | Admitting: Anesthesiology

## 2024-01-29 ENCOUNTER — Encounter (HOSPITAL_COMMUNITY): Admission: EM | Disposition: E | Payer: Self-pay | Source: Home / Self Care | Attending: Internal Medicine

## 2024-01-29 DIAGNOSIS — I480 Paroxysmal atrial fibrillation: Secondary | ICD-10-CM | POA: Diagnosis not present

## 2024-01-29 DIAGNOSIS — I4430 Unspecified atrioventricular block: Secondary | ICD-10-CM | POA: Diagnosis not present

## 2024-01-29 DIAGNOSIS — I2510 Atherosclerotic heart disease of native coronary artery without angina pectoris: Secondary | ICD-10-CM

## 2024-01-29 DIAGNOSIS — I10 Essential (primary) hypertension: Secondary | ICD-10-CM | POA: Diagnosis not present

## 2024-01-29 DIAGNOSIS — I3139 Other pericardial effusion (noninflammatory): Secondary | ICD-10-CM

## 2024-01-29 DIAGNOSIS — N179 Acute kidney failure, unspecified: Secondary | ICD-10-CM | POA: Diagnosis not present

## 2024-01-29 DIAGNOSIS — I5030 Unspecified diastolic (congestive) heart failure: Secondary | ICD-10-CM | POA: Diagnosis not present

## 2024-01-29 HISTORY — PX: PACEMAKER LEADLESS INSERTION: EP1219

## 2024-01-29 LAB — CBC
HCT: 29.6 % — ABNORMAL LOW (ref 36.0–46.0)
Hemoglobin: 9.6 g/dL — ABNORMAL LOW (ref 12.0–15.0)
MCH: 32 pg (ref 26.0–34.0)
MCHC: 32.4 g/dL (ref 30.0–36.0)
MCV: 98.7 fL (ref 80.0–100.0)
Platelets: 185 K/uL (ref 150–400)
RBC: 3 MIL/uL — ABNORMAL LOW (ref 3.87–5.11)
RDW: 16.6 % — ABNORMAL HIGH (ref 11.5–15.5)
WBC: 9.9 K/uL (ref 4.0–10.5)
nRBC: 0 % (ref 0.0–0.2)

## 2024-01-29 LAB — BASIC METABOLIC PANEL WITH GFR
Anion gap: 10 (ref 5–15)
BUN: 37 mg/dL — ABNORMAL HIGH (ref 8–23)
CO2: 26 mmol/L (ref 22–32)
Calcium: 7.7 mg/dL — ABNORMAL LOW (ref 8.9–10.3)
Chloride: 103 mmol/L (ref 98–111)
Creatinine, Ser: 1.85 mg/dL — ABNORMAL HIGH (ref 0.44–1.00)
GFR, Estimated: 26 mL/min — ABNORMAL LOW (ref >60)
Glucose, Bld: 110 mg/dL — ABNORMAL HIGH (ref 70–99)
Potassium: 4.1 mmol/L (ref 3.5–5.1)
Sodium: 139 mmol/L (ref 135–145)

## 2024-01-29 LAB — ECHOCARDIOGRAM LIMITED
Est EF: <20
Height: 61 in
Weight: 1947.1 [oz_av]

## 2024-01-29 LAB — GLUCOSE, CAPILLARY: Glucose-Capillary: 101 mg/dL — ABNORMAL HIGH (ref 70–99)

## 2024-01-29 SURGERY — PACEMAKER LEADLESS INSERTION
Anesthesia: Monitor Anesthesia Care

## 2024-01-29 MED ORDER — SODIUM CHLORIDE 0.9% FLUSH: 3.0000 mL | INTRAVENOUS | Status: DC | PRN

## 2024-01-29 MED ORDER — CEFAZOLIN SODIUM-DEXTROSE 2-4 GM/100ML-% IV SOLN
2.0000 g | INTRAVENOUS | Status: DC
  Filled 2024-01-29: qty 100

## 2024-01-29 MED ORDER — SODIUM CHLORIDE 0.9 % IV SOLN: INTRAVENOUS | Status: DC | PRN

## 2024-01-29 MED ORDER — HEPARIN (PORCINE) IN NACL 1000-0.9 UT/500ML-% IV SOLN
INTRAVENOUS | Status: DC | PRN
  Administered 2024-01-29 (×2): 500 mL

## 2024-01-29 MED ORDER — FENTANYL CITRATE (PF) 100 MCG/2ML IJ SOLN
INTRAMUSCULAR | Status: AC
  Filled 2024-01-29: qty 2

## 2024-01-29 MED ORDER — HEPARIN SODIUM (PORCINE) 1000 UNIT/ML IJ SOLN
INTRAMUSCULAR | Status: DC | PRN
  Administered 2024-01-29: 3000 [IU] via INTRAVENOUS

## 2024-01-29 MED ORDER — SODIUM CHLORIDE 0.9 % IV SOLN: INTRAVENOUS | Status: DC

## 2024-01-29 MED ORDER — EPINEPHRINE 1 MG/10ML IV SOSY
PREFILLED_SYRINGE | INTRAVENOUS | Status: AC
  Filled 2024-01-29: qty 10

## 2024-01-29 MED ORDER — BUPIVACAINE HCL (PF) 0.25 % IJ SOLN
INTRAMUSCULAR | Status: DC | PRN
  Administered 2024-01-29 (×2): 10 mL

## 2024-01-29 MED ORDER — PROPOFOL 500 MG/50ML IV EMUL
INTRAVENOUS | Status: DC | PRN
  Administered 2024-01-29: 100 ug/kg/min via INTRAVENOUS
  Administered 2024-01-29: 30 ug/kg/min via INTRAVENOUS

## 2024-01-29 MED ORDER — LIDOCAINE HCL (PF) 1 % IJ SOLN: INTRAMUSCULAR | Status: DC | PRN

## 2024-01-29 MED ORDER — CHLORHEXIDINE GLUCONATE 4 % EX SOLN
Freq: Once | CUTANEOUS | Status: AC
  Administered 2024-01-29: 1 via TOPICAL
  Filled 2024-01-29: qty 60

## 2024-01-29 MED ORDER — PHENYLEPHRINE 80 MCG/ML (10ML) SYRINGE FOR IV PUSH (FOR BLOOD PRESSURE SUPPORT)
PREFILLED_SYRINGE | INTRAVENOUS | Status: DC | PRN
  Administered 2024-01-29: 80 ug via INTRAVENOUS

## 2024-01-29 MED ORDER — BUPIVACAINE HCL (PF) 0.25 % IJ SOLN
INTRAMUSCULAR | Status: AC
  Filled 2024-01-29: qty 30

## 2024-01-29 MED ORDER — EPINEPHRINE 1 MG/10ML IV SOSY
PREFILLED_SYRINGE | INTRAVENOUS | Status: DC | PRN
  Administered 2024-01-29: 1 mg via INTRAVENOUS
  Administered 2024-01-29: .9 mg via INTRAVENOUS
  Administered 2024-01-29: 20 ug via INTRAVENOUS
  Administered 2024-01-29: 1 mg via INTRAVENOUS

## 2024-01-29 MED ORDER — IOHEXOL 350 MG/ML SOLN
INTRAVENOUS | Status: DC | PRN
  Administered 2024-01-29 (×3): 10 mL

## 2024-01-29 MED ORDER — SODIUM CHLORIDE 0.9 % IV SOLN: 250.0000 mL | INTRAVENOUS | Status: DC | PRN

## 2024-01-29 MED ORDER — PHENYLEPHRINE HCL-NACL 20-0.9 MG/250ML-% IV SOLN
INTRAVENOUS | Status: DC | PRN
  Administered 2024-01-29: 50 ug/min via INTRAVENOUS

## 2024-01-29 MED ORDER — LIDOCAINE 2% (20 MG/ML) 5 ML SYRINGE
INTRAMUSCULAR | Status: DC | PRN
  Administered 2024-01-29: 30 mg via INTRAVENOUS

## 2024-01-29 MED ORDER — SODIUM CHLORIDE 0.9% FLUSH
3.0000 mL | Freq: Two times a day (BID) | INTRAVENOUS | Status: DC
  Administered 2024-01-29: 3 mL via INTRAVENOUS

## 2024-01-29 MED ORDER — CEFAZOLIN SODIUM-DEXTROSE 2-4 GM/100ML-% IV SOLN
INTRAVENOUS | Status: AC
  Filled 2024-01-29: qty 100

## 2024-01-29 MED ORDER — EPHEDRINE SULFATE-NACL 50-0.9 MG/10ML-% IV SOSY
PREFILLED_SYRINGE | INTRAVENOUS | Status: DC | PRN
  Administered 2024-01-29: 10 mg via INTRAVENOUS
  Administered 2024-01-29: 5 mg via INTRAVENOUS
  Administered 2024-01-29: 10 mg via INTRAVENOUS

## 2024-01-29 SURGICAL SUPPLY — 13 items
CABLE SURGICAL S-101-97-12 (CABLE) ×2 IMPLANT
CLOSURE PERCLOSE PROSTYLE (Vascular Products) IMPLANT
GUIDEWIRE SAFE TJ AMPLATZ EXST (WIRE) IMPLANT
KIT MICROPUNCTURE NIT STIFF (SHEATH) IMPLANT
PACEMAKER LEADLESS AV2 MICRA (Pacemaker) IMPLANT
PAD DEFIB RADIO PHYSIO CONN (PAD) ×2 IMPLANT
SHEATH DILAT COONS TAPER 22F (SHEATH) IMPLANT
SHEATH INTRODUCER MICRA (SHEATH) IMPLANT
SHEATH PINNACLE 8F 10CM (SHEATH) IMPLANT
SHEATH PROBE COVER 6X72 (BAG) IMPLANT
TRAY PACEMAKER INSERTION (PACKS) ×2 IMPLANT
TRAY PERICARDIOCENTESIS 6FX60 (TRAY / TRAY PROCEDURE) IMPLANT
WIRE MICRO SET SILHO 5FR 7 (SHEATH) IMPLANT

## 2024-02-01 LAB — CULTURE, BLOOD (ROUTINE X 2)
Culture: NO GROWTH
Culture: NO GROWTH
Special Requests: ADEQUATE

## 2024-02-03 ENCOUNTER — Encounter (HOSPITAL_COMMUNITY): Payer: Self-pay | Admitting: Student in an Organized Health Care Education/Training Program

## 2024-02-03 NOTE — Progress Notes (Signed)
 OT Cancellation Note  Patient Details Name: Shelby Walker MRN: [id number] DOB: 09/01/1933   Cancelled Treatment:    Reason Eval/Treat Not Completed: Patient not medically ready. Per secure chat with MD, pt scheduled for pacemaker implantation today, and would like therapy to hold evals until after procedure. Will follow up as able to.   Renley Banwart C, OT  Acute Rehabilitation Services Office [phone] Secure chat preferred   Adrianne GORMAN Savers 22-Feb-2024, 7:23 AM

## 2024-02-03 NOTE — Op Note (Signed)
   Procedure:  US  Venous Access (x1)  MDT Micra Implant  Pre-Op  Diagnosis: CHB   Post-Op Diagnosis: same   Procedure Date: 02-21-24   Attending: Adina Primus, MD   Anesthesia: monitored anesthesia care   Operative Report: RIGHT groin prepped with chlorhexidine  scrub. 30 cc lidocaine  injected. Using ultrasound, the LEFT femoral vein was visualized and was small. During real-time ultrasound, the needle was seen directly puncturing the vein. Using the modified Seldinger technique, a wire was advanced but had difficulty threading despite being intraluminal on ultrasound. Several attempts were made to access the RFV however tortuosity limited any sheath placement. The LFV was then easily accessed with a standard access needle. An 8 Fr sheath placed into the vessel. Ultrasound images from vascular access were recorded and retained on the system. Serial dilation up to 16 Fr performed and the 27 Fr Micra sheath was advanced under fluoro guidance to the RA. 3000 units of heparin  was given. The Micra device and positioning sheath was advanced across the tricuspid valve, aimed toward the mid septum. This was confirmed with contrast injection in both the RAO and LAO views. The initial position was towards the apical septum. The sheath was redirected to the mid septum on RAO and LAO views. At this point there was profound hypotension. Stat echo and interventional cardiology was called who arrived promptly. The Micra was deployed to allow for temporary pacing if needed and had ventricular capture. There was an effusion evident on fluoroscopy. Dr. Wonda assisted in pericardial access and drain placement. A large volume of blood was aspirated however there was no femoral arterial pulse on palpation or with ultrasound evaluation. The patient remained DNR/DNI which was again confirmed with the family and consistent with our discussion immediately prior to the procedure regarding code status.    EBL: less than 50 mL   Implanted Hardware: Implant Name Type Inv. Item Serial No. Manufacturer Lot No. LRB No. Used Action  CLOSURE PERCLOSE PROSTYLE - [id number] Vascular Products CLOSURE PERCLOSE PROSTYLE  [employer]OTT VASC[employer] DEVICES [id number] N/A 1 Implanted  CLOSURE PERCLOSE PROSTYLE - [id number] Vascular Products CLOSURE PERCLOSE PROSTYLE  ABBOTT VASCULAR DEVICES [id number] N/A 1 Implanted  PACEMAKER LEADLESS AV2 MICRA - [id number] E Pacemaker PACEMAKER LEADLESS AV2 MICRA [id number] E [employer]  N/A 1 Implanted   Complications: Likely RV laceration/perforation leading to cardiac tamponade and death Disposition: deceased   Summary: 1. Micra AV2 implant 2. Epicardial drain placement with large volume blood aspirated however patient remained PEA 3. RV laceration/perforation resulting in cardiac tamponade and death 4. DNR/DNI confirmed again with family and patient passed   Shelby DELENA Primus, MD [facility]  Cardiac Electrophysiology

## 2024-02-03 NOTE — Death Summary Note (Addendum)
  Advanced Heart Failure Death Summary  Death Summary   Patient ID: Shelby Walker MRN: [id number], DOB/AGE: 11-Feb-1934 89 y.o. Admit date: 01/25/2024 D/C date:     01-31-24   Primary Discharge Diagnoses:  Sinus Arrest -> syncope Septic Shock D/t Aspiration PNA  Acute on chronic HFpEF AKI on CKD 3b due to ATN  Hospital Course:   89 y.o. female with prior h/o sinus arrest s/p PPM (01/05/2022) c/b MSSA bacteremia and septic shock s/p device removal (01/29/22).  She had not had any issues with syncope or sinus pauses for the past 2 years and had a loop recorder placed in January of 2025. However, several days prior to admission she had a LOC episode with a fall and struck her head. She again had a syncopal episode the day of admission and struck her chest. EMS was called and she had a long sinus pause en route to the hospital. In the ED, she developed frequent sinus pauses and had a brief CPR code that lasted ~30 seconds of compressions. She was started on isoproterenol , and then developed symptomatic complete heart block. Given her symptoms (intense chest pain, lightheadedness) with the complete heart block despite escalating doses of isoproterenol , decision was made to place a TVP. Hs trop trend was not c/w ACS. Head CT was also negative. Later in the evening, she developed sepsis physiology w/ fever, hypotension and leukocytosis. She required IV vasopressor support w/ EPI. BCx and UCx were collected and she was started on broad spectrum abx. CCM was consulted and felt source was likely d/t aspiration PNA. Abx adjusted and sepsis resolved. She was able to wean off EPI support and fevers and leukocytosis resolved. BCx remained negative. Given her history of sinus arrest x 2, permanent pacing was recommended. Traditional PPM vs leadless PPM was discussed. Decision made to proceed w/ leadless PPM. Unfortunately, during procedure pt suffered a RV laceration and passed away on the table.    Significant  Diagnostic Studies 2D Echo EF > 75%. RV normal    Consultations  Advanced Heart Failure Electrophysiology  Critical Care Medicine    Duration of Discharge Encounter: Greater than 35 minutes   Signed, Caffie Shed, PA-C 01/31/2024, 1:32 PM   Agree with above. Dr. Almetta and I met with family after patient's passing and discussed situation in detail.   Toribio Fuel, MD  3:54 PM

## 2024-02-03 NOTE — Transfer of Care (Signed)
 Immediate Anesthesia Transfer of Care Note  Patient: Shelby Walker  Procedure(s) Performed: PACEMAKER LEADLESS INSERTION  Patient Location: Pt deceased  Anesthesia Type:  Level of Consciousness:   Airway & Oxygen  Therapy:   Post-op Assessment:   Post vital signs:   Last Vitals:  Vitals Value Taken Time  BP    Temp    Pulse    Resp    SpO2      Last Pain:  Vitals:   17-Feb-2024 0800  TempSrc:   PainSc: 0-No pain      Patients Stated Pain Goal: 0 (01/28/24 0415)  Complications: No notable events documented.

## 2024-02-03 NOTE — H&P (View-Only) (Signed)
 Progress Note  Patient Name: Shelby Walker Date of Encounter: 2024/02/17  Primary Cardiologist: None   Patient ID  Shelby Walker is a 89 y.o. female with sinus arrest s/p PPM (01/2022) c/b MSSA bacteremia and septic shock s/p device removal (01/29/22), nonobstructive CAD who presents with LOC with associated sinus arrest now with intermittent CHB.   Subjective   Telemetry without VP the past 24h. TVP with threshold 4.0 mA up slightly from 3.5 mA yesterday. Plan for add on late morning today. Proceed with Micra AV2 implant. Family and patient are agreeable. Not febrile but on standing tylenol  for pain.   Inpatient Medications    Scheduled Meds:  acetaminophen   650 mg Oral Q6H   amiodarone   100 mg Oral Daily   atorvastatin   20 mg Oral Daily   Chlorhexidine  Gluconate Cloth  6 each Topical Daily   feeding supplement  237 mL Oral BID BM   folic acid   1 mg Oral Daily   insulin  aspart  0-5 Units Subcutaneous QHS   insulin  aspart  0-9 Units Subcutaneous TID WC   insulin  aspart  3 Units Subcutaneous TID WC   insulin  glargine-yfgn  6 Units Subcutaneous Daily   mupirocin  ointment  1 Application Nasal BID   oxymetazoline   1 spray Each Nare BID   pantoprazole   40 mg Oral Daily   predniSONE   5 mg Oral Q breakfast   sodium chloride  flush  3 mL Intravenous Q12H   sodium chloride  flush  3 mL Intravenous Q12H   sodium chloride  flush  3 mL Intravenous Q12H   Continuous Infusions:  sodium chloride  10 mL/hr at 01/28/24 1900   sodium chloride      sodium chloride  20 mL/hr at 2024/02/17 9372    ceFAZolin  (ANCEF ) IV     piperacillin -tazobactam (ZOSYN )  IV 2.25 g (02-Mar-2024 0653)   PRN Meds: sodium chloride , dextrose , docusate sodium , guaiFENesin -dextromethorphan , ondansetron  (ZOFRAN ) IV, mouth rinse, oxyCODONE , polyethylene glycol, sodium chloride  flush, sodium chloride  flush, sodium chloride  flush, traMADol    Vital Signs    Vitals:   Feb 17, 2024 0200 17-Feb-2024 0300 Feb 17, 2024 0400 02-17-2024 0618  BP:  (!) 141/85 123/79 (!) 120/55   Pulse: 78 74 72   Resp: 12 17 18    Temp: 99 F (37.2 C) 99.5 F (37.5 C) 99.5 F (37.5 C)   TempSrc:      SpO2: 98% 98% 99%   Weight:    55.2 kg  Height:        Intake/Output Summary (Last 24 hours) at 02/17/2024 0812 Last data filed at 2024-02-17 9372 Gross per 24 hour  Intake 686.07 ml  Output 2980 ml  Net -2293.93 ml   Filed Weights   01/26/24 1809 01/28/24 0700 17-Feb-2024 0618  Weight: 57.2 kg 57.4 kg 55.2 kg   Telemetry    NSR 60-80s, no VP - Personally Reviewed  ECG    None today - Personally Reviewed  Physical Exam   GEN: No acute distress.   Neck: No JVD Cardiac: RRR, no murmurs, rubs, or gallops.  Respiratory: b/l lower lungs rhonchi  GI: Soft, nontender, non-distended  MS: No edema; No deformity. Neuro:  Nonfocal  Skin: RIJ T wire without erythema or bleeding  Psych: Normal affect   Labs    Chemistry Recent Labs  Lab 01/26/24 0000 01/26/24 1926 01/27/24 0410 01/28/24 0607 February 17, 2024 0220  NA 141   < > 138 135 139  K 4.5   < > 4.9 4.6 4.1  CL 104   < >  103 101 103  CO2 27   < > 21* 21* 26  GLUCOSE 234*   < > 320* 148* 110*  BUN 36*   < > 43* 41* 37*  CREATININE 1.39*   < > 2.37* 1.76* 1.85*  CALCIUM  8.3*   < > 7.5* 7.3* 7.7*  PROT 5.8*  --   --   --   --   ALBUMIN  3.0*  --   --   --   --   AST 31  --   --   --   --   ALT 24  --   --   --   --   ALKPHOS 52  --   --   --   --   BILITOT 0.9  --   --   --   --   GFRNONAA 36*   < > 19* 27* 26*  ANIONGAP 10   < > 14 13 10    < > = values in this interval not displayed.    Hematology Recent Labs  Lab 01/27/24 0410 01/28/24 0607 2024-02-14 0220  WBC 16.6* 11.0* 9.9  RBC 2.74* 2.53* 3.00*  HGB 8.7* 8.1* 9.6*  HCT 28.0* 25.2* 29.6*  MCV 102.2* 99.6 98.7  MCH 31.8 32.0 32.0  MCHC 31.1 32.1 32.4  RDW 17.0* 17.0* 16.6*  PLT 158 126* 185   BNP Recent Labs  Lab 01/27/24 0647  BNP 1,411.4*   Radiology    DG CHEST PORT 1 VIEW Result Date:  01/27/2024 EXAM: 1 VIEW(S) XRAY OF THE CHEST 01/27/2024 12:01:36 PM COMPARISON: 01/26/2024 CLINICAL HISTORY: 200808 Hypoxia 200808 FINDINGS: LINES, TUBES AND DEVICES: Single lead pacer. Loop recorder. LUNGS AND PLEURA: Similar tiny bilateral pleural effusions. Similar mild pulmonary venous congestion. Left greater than right base subsegmental atelectasis. No pneumothorax. HEART AND MEDIASTINUM: Mild cardiomegaly. Transverse aortic atherosclerosis. BONES AND SOFT TISSUES: No acute osseous abnormality. IMPRESSION: 1. No significant change in cardiomegaly, pulmonary venous congestion, and tiny bilateral pleural effusions. 2. Similar bibasilar atelectasis. Electronically signed by: Rockey Kilts MD 01/27/2024 12:40 PM EST RP Workstation: [id number]   Cardiac Studies   None today   Assessment & Plan   Shelby Walker is a 89 y.o. female with sinus arrest s/p PPM (01/2022) c/b MSSA bacteremia and septic shock s/p device removal (01/29/22), nonobstructive CAD who presents with LOC with associated sinus arrest now with intermittent CHB.   CHB Sinus node dysfxn Met with family this morning and they are all agreeable to proceed with Micra AV2 implant.  Plan for third case today late morning.  Remains afebrile while on standing Tylenol  for chest pain.  NSR today without VP over the past 24 hours with slightly elevated thresholds from yesterday.  Aspiration PNA Sepsis  Lactic acidosis is resolved and AKI was improving but is stable today with sCr 1.85 from 1.76.  She is no longer febrile but is on standing Tylenol  for post CPR pain.  ABX have been de-escalated to Zosyn  monotherapy.   AKI on CKD  Stable today. Will minimize contrast is much as possible with micro placement.  For questions or updates, please contact [facility] Please consult [email or web address] for contact info under Cardiology/STEMI.   Signed,  Shelby DELENA Primus, MD [facility]  Cardiac Electrophysiology  02/14/2024, 8:12 AM

## 2024-02-03 NOTE — Anesthesia Preprocedure Evaluation (Addendum)
 Anesthesia Evaluation  Patient identified by MRN, date of birth, ID band  Reviewed: Allergy & Precautions, NPO status , Patient's Chart, lab work & pertinent test results  History of Anesthesia Complications (+) PONV and history of anesthetic complications  Airway Mallampati: III  TM Distance: >3 FB Neck ROM: Limited    Dental  (+) Edentulous Upper, Edentulous Lower, Dental Advisory Given   Pulmonary    breath sounds clear to auscultation       Cardiovascular hypertension, Pt. on medications + CAD and + Peripheral Vascular Disease  + dysrhythmias + pacemaker  Rhythm:Regular  1. Left ventricular ejection fraction, by estimation, is >75%. The left  ventricle has hyperdynamic function. The left ventricle has no regional  wall motion abnormalities. There is mild concentric left ventricular  hypertrophy. Left ventricular diastolic  parameters are consistent with Grade I diastolic dysfunction (impaired  relaxation). Elevated left ventricular end-diastolic pressure.   2. Right ventricular systolic function is normal. The right ventricular  size is normal. There is mildly elevated pulmonary artery systolic  pressure. The estimated right ventricular systolic pressure is 40.2 mmHg.   3. Left atrial size was severely dilated.   4. Right atrial size was moderately dilated.   5. The mitral valve is degenerative. Trivial mitral valve regurgitation.  Mild mitral stenosis. The mean mitral valve gradient is 4.0 mmHg with  average heart rate of 80 bpm.   6. The aortic valve is tricuspid. Aortic valve regurgitation is not  visualized. Aortic valve sclerosis/calcification is present, without any  evidence of aortic stenosis.   7. The inferior vena cava is dilated in size with <50% respiratory  variability, suggesting right atrial pressure of 15 mmHg.     Neuro/Psych    GI/Hepatic ,GERD  ,,  Endo/Other  diabetes    Renal/GU Renal  InsufficiencyRenal diseaseLab Results      Component                Value               Date                      NA                       139                 2024/02/27                K                        4.1                 02/27/2024                CO2                      26                  02-27-24                GLUCOSE                  110 (H)             Feb 27, 2024                BUN  37 (H)              2024/02/13                CREATININE               1.85 (H)            02/13/24                CALCIUM                   7.7 (L)             2024-02-13                GFR                      40.11 (L)           01/19/2019                EGFR                     35 (L)              03/11/2023                GFRNONAA                 26 (L)              Feb 13, 2024                Musculoskeletal   Abdominal   Peds  Hematology  (+) Blood dyscrasia, anemia Lab Results      Component                Value               Date                      WBC                      9.9                 02/13/2024                HGB                      9.6 (L)             02/13/24                HCT                      29.6 (L)            02-13-24                MCV                      98.7                2024-02-13                PLT                      185                 2024/02/13  Anesthesia Other Findings   Reproductive/Obstetrics                              Anesthesia Physical Anesthesia Plan  ASA: 4  Anesthesia Plan: MAC   Post-op Pain Management: Minimal or no pain anticipated and Precedex    Induction: Intravenous  PONV Risk Score and Plan: 3 and Treatment may vary due to age or medical condition  Airway Management Planned: Nasal Cannula, Simple Face Mask and Natural Airway  Additional Equipment: None  Intra-op Plan:   Post-operative Plan:   Informed Consent: I have reviewed the patients  History and Physical, chart, labs and discussed the procedure including the risks, benefits and alternatives for the proposed anesthesia with the patient or authorized representative who has indicated his/her understanding and acceptance.   Patient has DNR.  Discussed DNR with power of attorney and Continue DNR.   Dental advisory given and Consent reviewed with POA  Plan Discussed with: CRNA  Anesthesia Plan Comments: (Ok to code meds and cardioversion/defibrillation. )        Anesthesia Quick Evaluation

## 2024-02-03 NOTE — Discharge Instructions (Signed)

## 2024-02-03 NOTE — Progress Notes (Signed)
 Progress Note  Patient Name: Shelby Walker Date of Encounter: 2024/02/17  Primary Cardiologist: None   Patient ID  Shelby Walker is a 89 y.o. female with sinus arrest s/p PPM (01/2022) c/b MSSA bacteremia and septic shock s/p device removal (01/29/22), nonobstructive CAD who presents with LOC with associated sinus arrest now with intermittent CHB.   Subjective   Telemetry without VP the past 24h. TVP with threshold 4.0 mA up slightly from 3.5 mA yesterday. Plan for add on late morning today. Proceed with Micra AV2 implant. Family and patient are agreeable. Not febrile but on standing tylenol  for pain.   Inpatient Medications    Scheduled Meds:  acetaminophen   650 mg Oral Q6H   amiodarone   100 mg Oral Daily   atorvastatin   20 mg Oral Daily   Chlorhexidine  Gluconate Cloth  6 each Topical Daily   feeding supplement  237 mL Oral BID BM   folic acid   1 mg Oral Daily   insulin  aspart  0-5 Units Subcutaneous QHS   insulin  aspart  0-9 Units Subcutaneous TID WC   insulin  aspart  3 Units Subcutaneous TID WC   insulin  glargine-yfgn  6 Units Subcutaneous Daily   mupirocin  ointment  1 Application Nasal BID   oxymetazoline   1 spray Each Nare BID   pantoprazole   40 mg Oral Daily   predniSONE   5 mg Oral Q breakfast   sodium chloride  flush  3 mL Intravenous Q12H   sodium chloride  flush  3 mL Intravenous Q12H   sodium chloride  flush  3 mL Intravenous Q12H   Continuous Infusions:  sodium chloride  10 mL/hr at 01/28/24 1900   sodium chloride      sodium chloride  20 mL/hr at 2024/02/17 9372    ceFAZolin  (ANCEF ) IV     piperacillin -tazobactam (ZOSYN )  IV 2.25 g (02-Mar-2024 0653)   PRN Meds: sodium chloride , dextrose , docusate sodium , guaiFENesin -dextromethorphan , ondansetron  (ZOFRAN ) IV, mouth rinse, oxyCODONE , polyethylene glycol, sodium chloride  flush, sodium chloride  flush, sodium chloride  flush, traMADol    Vital Signs    Vitals:   Feb 17, 2024 0200 17-Feb-2024 0300 Feb 17, 2024 0400 02-17-2024 0618  BP:  (!) 141/85 123/79 (!) 120/55   Pulse: 78 74 72   Resp: 12 17 18    Temp: 99 F (37.2 C) 99.5 F (37.5 C) 99.5 F (37.5 C)   TempSrc:      SpO2: 98% 98% 99%   Weight:    55.2 kg  Height:        Intake/Output Summary (Last 24 hours) at 02/17/2024 0812 Last data filed at 2024-02-17 9372 Gross per 24 hour  Intake 686.07 ml  Output 2980 ml  Net -2293.93 ml   Filed Weights   01/26/24 1809 01/28/24 0700 17-Feb-2024 0618  Weight: 57.2 kg 57.4 kg 55.2 kg   Telemetry    NSR 60-80s, no VP - Personally Reviewed  ECG    None today - Personally Reviewed  Physical Exam   GEN: No acute distress.   Neck: No JVD Cardiac: RRR, no murmurs, rubs, or gallops.  Respiratory: b/l lower lungs rhonchi  GI: Soft, nontender, non-distended  MS: No edema; No deformity. Neuro:  Nonfocal  Skin: RIJ T wire without erythema or bleeding  Psych: Normal affect   Labs    Chemistry Recent Labs  Lab 01/26/24 0000 01/26/24 1926 01/27/24 0410 01/28/24 0607 February 17, 2024 0220  NA 141   < > 138 135 139  K 4.5   < > 4.9 4.6 4.1  CL 104   < >  103 101 103  CO2 27   < > 21* 21* 26  GLUCOSE 234*   < > 320* 148* 110*  BUN 36*   < > 43* 41* 37*  CREATININE 1.39*   < > 2.37* 1.76* 1.85*  CALCIUM  8.3*   < > 7.5* 7.3* 7.7*  PROT 5.8*  --   --   --   --   ALBUMIN  3.0*  --   --   --   --   AST 31  --   --   --   --   ALT 24  --   --   --   --   ALKPHOS 52  --   --   --   --   BILITOT 0.9  --   --   --   --   GFRNONAA 36*   < > 19* 27* 26*  ANIONGAP 10   < > 14 13 10    < > = values in this interval not displayed.    Hematology Recent Labs  Lab 01/27/24 0410 01/28/24 0607 2024-02-14 0220  WBC 16.6* 11.0* 9.9  RBC 2.74* 2.53* 3.00*  HGB 8.7* 8.1* 9.6*  HCT 28.0* 25.2* 29.6*  MCV 102.2* 99.6 98.7  MCH 31.8 32.0 32.0  MCHC 31.1 32.1 32.4  RDW 17.0* 17.0* 16.6*  PLT 158 126* 185   BNP Recent Labs  Lab 01/27/24 0647  BNP 1,411.4*   Radiology    DG CHEST PORT 1 VIEW Result Date:  01/27/2024 EXAM: 1 VIEW(S) XRAY OF THE CHEST 01/27/2024 12:01:36 PM COMPARISON: 01/26/2024 CLINICAL HISTORY: 200808 Hypoxia 200808 FINDINGS: LINES, TUBES AND DEVICES: Single lead pacer. Loop recorder. LUNGS AND PLEURA: Similar tiny bilateral pleural effusions. Similar mild pulmonary venous congestion. Left greater than right base subsegmental atelectasis. No pneumothorax. HEART AND MEDIASTINUM: Mild cardiomegaly. Transverse aortic atherosclerosis. BONES AND SOFT TISSUES: No acute osseous abnormality. IMPRESSION: 1. No significant change in cardiomegaly, pulmonary venous congestion, and tiny bilateral pleural effusions. 2. Similar bibasilar atelectasis. Electronically signed by: Rockey Kilts MD 01/27/2024 12:40 PM EST RP Workstation: [id number]   Cardiac Studies   None today   Assessment & Plan   Shelby Walker is a 89 y.o. female with sinus arrest s/p PPM (01/2022) c/b MSSA bacteremia and septic shock s/p device removal (01/29/22), nonobstructive CAD who presents with LOC with associated sinus arrest now with intermittent CHB.   CHB Sinus node dysfxn Met with family this morning and they are all agreeable to proceed with Micra AV2 implant.  Plan for third case today late morning.  Remains afebrile while on standing Tylenol  for chest pain.  NSR today without VP over the past 24 hours with slightly elevated thresholds from yesterday.  Aspiration PNA Sepsis  Lactic acidosis is resolved and AKI was improving but is stable today with sCr 1.85 from 1.76.  She is no longer febrile but is on standing Tylenol  for post CPR pain.  ABX have been de-escalated to Zosyn  monotherapy.   AKI on CKD  Stable today. Will minimize contrast is much as possible with micro placement.  For questions or updates, please contact [facility] Please consult [email or web address] for contact info under Cardiology/STEMI.   Signed,  Donnice DELENA Primus, MD [facility]  Cardiac Electrophysiology  02/14/2024, 8:12 AM

## 2024-02-03 NOTE — Consult Note (Signed)
 WOC Nurse Consult Note: Reason for Consult: Bilateral buttock PIs R buttock DTI, L buttock Stage 2; pictures in chart  Wound type: no PI seen in images  Pressure Injury POA: NA Measurement:NA Wound bed: NA Drainage (amount, consistency, odor) NA Periwound: intact  Dressing procedure/placement/frequency: Silicone foam dressing to the sacrum, lift to assess skin under dressing each shift. Report changes to [facility] nurse, or consult WOC nurse if new area develops.  Joniqua Sidle [facility] MSN, RN,CWOCN, CNS, CWON-AP [phone]

## 2024-02-03 NOTE — Interval H&P Note (Signed)
 History and Physical Interval Note:  February 08, 2024 10:29 AM  Shelby Walker  has presented today for surgery, with the diagnosis of heart block.  The various methods of treatment have been discussed with the patient and family. After consideration of risks, benefits and other options for treatment, the patient has consented to  Procedure(s): PACEMAKER LEADLESS INSERTION (N/A) as a surgical intervention.  The patient's history has been reviewed, patient examined, no change in status, stable for surgery.  I have reviewed the patient's chart and labs.  Questions were answered to the patient's satisfaction.     Donnice DELENA Primus

## 2024-02-03 NOTE — Progress Notes (Addendum)
 Advanced Heart Failure Rounding Note  Cardiologist: None  Chief Complaint: CHB   Patient Profile   89 y.o. female with sinus arrest s/p PPM (01/2022) c/b MSSA bacteremia and septic shock s/p device removal (01/29/22), now admitted for syncope d/t sinus arrest/ CHB.  TVP was placed. Found to be septic.   Subjective:    No pacing overnight.   3L in UOP yesterday w/ IV Lasix .   SCr 2.37>>1.76>>1.85 today. K 4.1  Remains on abx for suspected PNA. WBC now normalized. BCx NGTD.   Resting comfortably. No complaints.   Objective:   Weight Range: 55.2 kg Body mass index is 22.99 kg/m.   Vital Signs:   Temp:  [98.6 F (37 C)-99.5 F (37.5 C)] 99.5 F (37.5 C) Feb 13, 2024 0400) Pulse Rate:  [63-82] 72 13-Feb-2024 0400) Resp:  [12-22] 18 2024/02/13 0400) BP: (87-145)/(36-85) 120/55 02-13-24 0400) SpO2:  [84 %-100 %] 100 % February 13, 2024 1101) Weight:  [55.2 kg] 55.2 kg 02/13/2024 0618) Last BM Date : 01/27/24  Weight change: Filed Weights   01/26/24 1809 01/28/24 0700 February 13, 2024 0618  Weight: 57.2 kg 57.4 kg 55.2 kg    Intake/Output:   Intake/Output Summary (Last 24 hours) at 02-13-24 1133 Last data filed at 13-Feb-2024 9372 Gross per 24 hour  Intake 126.16 ml  Output 2980 ml  Net -2853.84 ml      Physical Exam    GENERAL: elderly, NAD Neck: RIJ TVP  Lungs- clear CARDIAC:  JVP not elevated         Normal rate with regular rhythm. No MRG. No LEE  ABDOMEN: Soft, non-tender, non-distended.  EXTREMITIES: Warm and well perfused.  NEUROLOGIC: No obvious FND   Telemetry   NSR 60s, no pacing overnight personally reviewed   EKG    N/A   Labs    CBC Recent Labs    01/28/24 0607 02/13/24 0220  WBC 11.0* 9.9  HGB 8.1* 9.6*  HCT 25.2* 29.6*  MCV 99.6 98.7  PLT 126* 185   Basic Metabolic Panel Recent Labs    88/74/74 0607 13-Feb-2024 0220  NA 135 139  K 4.6 4.1  CL 101 103  CO2 21* 26  GLUCOSE 148* 110*  BUN 41* 37*  CREATININE 1.76* 1.85*  CALCIUM  7.3* 7.7*    Liver Function Tests No results for input(s): AST, ALT, ALKPHOS, BILITOT, PROT, ALBUMIN  in the last 72 hours.  No results for input(s): LIPASE, AMYLASE in the last 72 hours. Cardiac Enzymes No results for input(s): CKTOTAL, CKMB, CKMBINDEX, TROPONINI in the last 72 hours.  BNP: BNP (last 3 results) Recent Labs    01/27/24 0647  BNP 1,411.4*    ProBNP (last 3 results) No results for input(s): PROBNP in the last 8760 hours.   D-Dimer No results for input(s): DDIMER in the last 72 hours. Hemoglobin A1C No results for input(s): HGBA1C in the last 72 hours.  Fasting Lipid Panel No results for input(s): CHOL, HDL, LDLCALC, TRIG, CHOLHDL, LDLDIRECT in the last 72 hours. Thyroid  Function Tests No results for input(s): TSH, T4TOTAL, T3FREE, THYROIDAB in the last 72 hours.  Invalid input(s): FREET3  Other results:   Imaging    No results found.    Medications:     Scheduled Medications:  [MAR Hold] acetaminophen   650 mg Oral Q6H   [MAR Hold] amiodarone   100 mg Oral Daily   [MAR Hold] atorvastatin   20 mg Oral Daily   [MAR Hold] Chlorhexidine  Gluconate Cloth  6 each Topical Daily   [MAR  Hold] feeding supplement  237 mL Oral BID BM   [MAR Hold] folic acid   1 mg Oral Daily   [MAR Hold] insulin  aspart  0-5 Units Subcutaneous QHS   [MAR Hold] insulin  aspart  0-9 Units Subcutaneous TID WC   [MAR Hold] insulin  aspart  3 Units Subcutaneous TID WC   [MAR Hold] insulin  glargine-yfgn  6 Units Subcutaneous Daily   [MAR Hold] mupirocin  ointment  1 Application Nasal BID   [MAR Hold] oxymetazoline   1 spray Each Nare BID   [MAR Hold] pantoprazole   40 mg Oral Daily   [MAR Hold] predniSONE   5 mg Oral Q breakfast   [MAR Hold] sodium chloride  flush  3 mL Intravenous Q12H   [MAR Hold] sodium chloride  flush  3 mL Intravenous Q12H   sodium chloride  flush  3 mL Intravenous Q12H    Infusions:  sodium chloride  10 mL/hr at 01/28/24  1900   sodium chloride      sodium chloride  20 mL/hr at 02-23-24 9372   ceFAZolin       ceFAZolin  (ANCEF ) IV     [MAR Hold] piperacillin -tazobactam (ZOSYN )  IV 2.25 g (October 25, 2023 0653)    PRN Medications: sodium chloride , bupivacaine  (PF), ceFAZolin , [MAR Hold] dextrose , [MAR Hold] docusate sodium , [MAR Hold] guaiFENesin -dextromethorphan , Heparin  (Porcine) in NaCl, iohexol , lidocaine  (PF), [MAR Hold] ondansetron  (ZOFRAN ) IV, [MAR Hold] mouth rinse, [MAR Hold] oxyCODONE , [MAR Hold] polyethylene glycol, [MAR Hold] sodium chloride  flush, [MAR Hold] sodium chloride  flush, sodium chloride  flush, [MAR Hold] traMADol     Assessment/Plan   1. CHB - h/o sinus arrest s/p PPM (01/2022) c/b MSSA bacteremia and septic shock s/p device removal (01/29/22) - now admitted for syncope d/t sinus arrest/ CHB>>TVP placed  - conduction improved w/ treatment of sepsis, no pacing overnight - EP planning leadless PPM today   2. Septic Shock - Suspect 2/2 Aspiration PNA - improved w/ abx. Now off IV pressor support, WBC normalized, Fevers resolved - BCx NGTD - Now off Vanc. Continue Zosyn  per CCM   3. HFpEF  - Echo this admit EF > 75%, RV normal. IVC dilated, assumed RAP ~15.  - Admit BNP 1400. Diuresed well w/ IV Lasix . Volume ok today    4. AKI  - multifactorial in setting of shock/hypotension + urinary retention  - Improving, SCr 1.39>>2.29>>2.37>>1.76>>1.85  - follow BMP   5. Chest Pain  - known h/o nonobstructive CAD>> Hs trop, flat/low level not c/w ACS  - Suspect Musculoskeletal post chest compression>>PRN tramadol     Getting PPM today. Can likely go to floor post implant. Will defer to EP    Length of Stay: 3  Brittainy Marcine RIGGERS  2024/02/23, 11:33 AM  Advanced Heart Failure Team Pager [phone] (M-F; 7a - 5p)  Please contact [facility] for night-coverage after hours (5p -7a ) and weekends on [email or web address]  Patient seen and examined with the above-signed Advanced Practice Provider  and/or Housestaff. I personally reviewed laboratory data, imaging studies and relevant notes. I independently examined the patient and formulated the important aspects of the plan. I have edited the note to reflect any of my changes or salient points. I have personally discussed the plan with the patient and/or family.  TVP remains in place. No pacing overnight  General:  Lying in bed. No resp difficulty HEENT: normal Neck: supple.RIJ TVP Cor: Regular rate & rhythm. No rubs, gallops or murmurs. Lungs: clear Abdomen: soft, nontender, nondistended.Good bowel sounds. Extremities: no cyanosis, clubbing, rash, edema diffuse arthritic changes Neuro: alert & orientedx3, cranial nerves grossly  intact. moves all 4 extremities w/o difficulty. Affect pleasant  Plan for leadless PM today. Procedure reviewed with patient and family by both me and the EP team in detail.   Asani Mcburney. MD

## 2024-02-03 NOTE — Plan of Care (Signed)

## 2024-02-03 DEATH — deceased

## 2024-02-05 NOTE — Telephone Encounter (Signed)
 Spoke with [name] and cancelled patient assistance renewal

## 2024-02-13 ENCOUNTER — Ambulatory Visit

## 2024-02-25 ENCOUNTER — Encounter

## 2024-02-29 ENCOUNTER — Ambulatory Visit

## 2024-03-05 NOTE — Death Summary Note (Signed)
 DEATH SUMMARY   Patient Details  Name: Shelby Walker MRN: [id number] DOB: 08-21-1933  Admission/Discharge Information   Admit Date:  02-20-24  Date of Death: Date of Death: 24-Feb-2024  Time of Death: Time of Death: 06-Mar-1151  Length of Stay: 3  Referring Physician: Nichole Senior, MD   Reason(s) for Hospitalization  CHB  Diagnoses  Preliminary cause of death: Pericardial effusion with cardiac tamponade Secondary Diagnoses (including complications and co-morbidities):  Principal Problem:   Complete heart block [facility])  Brief Hospital Course (including significant findings, care, treatment, and services provided and events leading to death)  Shelby Walker is a 89 y.o. female with sinus arrest s/p PPM (01/2022) c/b MSSA bacteremia and septic shock s/p device removal (01/29/22), nonobstructive CAD who presents with LOC with associated sinus arrest now with intermittent CHB with course c/b sepsis 2/2 aspiration PNA who underwent attempted Micra AV2 implant for CHB on 2024/02/24 which was c/b RV perforation, cardiac tamponade and death.    CHB Sinus node dysfxn Patient admitted following several syncopal events with EMS observing significant sinus pause.  Several syncopal events leading up to her admission.  Patient became unresponsive and was asystolic on monitoring in the ED with brief CPR of 20-30 seconds before ROSC.  Patient had observed aspiration event during that time.  She had multiple episodes of sinus pauses along with intermittent CHB.  Isoproterenol  was initiated however she continued to have asystolic events and ultimately underwent TVP placement.  Throughout the next 72 hours she had high degree of CHB requiring ventricular backup pacing but would intermittently regain intrinsic conduction.  I had ongoing discussion with the family regarding different options for management of her intermittent CHB and given her fairly reasonable functional status prior to admission they felt that PPM implant  was reasonable although GOC was discussed several times with her frailty and comorbidities.  I provided 2 options for consideration including right sided traditional transvenous PPM with the benefit of low risk of cardiac perforation and vascular access damage but higher risk of potential infection with her on MTX and having previous device infection requiring PPM explant on the left side.  Her prior experience with PPM implant led to a prolonged hospitalization, MSSA bacteremia and concern for spinal discitis.  She wanted to avoid this if at all possible and her and her family had opted for leadless PPM as an option for intermittent ventricular pacing.  We had discussed dual-chamber leadless PPM however given her age, risk factors and frailty I have recommended against proceeding directly with atrial leadless PPM implant as I was concerned about the risk for cardiac perforation and with her having limited bailout options did not want to commit to her to this upfront.  I took her to the EP lab on February 24, 2024.  She had a diminutive RFV with calcification and difficulty advancing standard access wires as well as micropuncture wires so right sided access was aborted and left sided access through the LFV was obtained without difficulty.  The standard Micra sheath was advanced to the mid RA and the dilator and wire removed with the Micra AV2 advanced to the RA and unsheathed.  There was no difficulty crossing the tricuspid valve however despite having septal placement her heart appeared to be rotated on RAO fluoroscopy and contrast showed an apical septal location. The device was not deployed here in favor of a more basal septal approach. The RV appeared extremely thin. Withdrawal of the catheter with re-advancement of the sheath towards a  more basal septal location was pursued.  Within 1 minute of catheter manipulation she became acutely hypotensive and cardiac effusion was evident on fluoroscopy.  Stat echo and  pericardial drain was pursued.  Despite drain placement she had PEA arrest.  Large volume pericardiocentesis performed however no cardiac motion observed and family had confirmed DNR/DNI immediately preprocedure and in prior discussions.   Aspiration PNA Sepsis  Lactic acidosis had resolved and AKI was improving prior to attempted PPM implant.  She was on Zosyn  monotherapy for aspiration pneumonia and had no recurrent fevers although she was on standing Tylenol  for post CPR chest pain.  AKI on CKD  AKI was improving but a plan to minimize contrast use with PPM implant.  Pertinent Labs and Studies  Significant Diagnostic Studies EP PPM/ICD IMPLANT Result Date: 02/02/2024  Summary: 1. Micra AV2 implant 2. Epicardial drain placement with large volume blood aspirated however patient remained PEA 3. RV laceration/perforation resulting in cardiac tamponade and death 4. DNR/DNI confirmed again with family and patient passed  Shelby DELENA Primus, MD [facility] Cardiac Electrophysiology    ECHOCARDIOGRAM LIMITED Result Date: 02-25-24    ECHOCARDIOGRAM LIMITED REPORT   Patient Name:   Shelby Walker Date of Exam: February 25, 2024 Medical Rec #:  [id number]   Height:       61.0 in Accession #:    [id number]  Weight:       121.7 lb Date of Birth:  08-11-1933   BSA:          1.529 m Patient Age:    89 years    BP:           0/0 mmHg Patient Gender: F           HR:           0 bpm. Exam Location:  Inpatient Procedure: Limited Echo (Both Spectral and Color Flow Doppler were utilized            during procedure). Indications:    I31.3 Pericardial effusion (noninflammatory)  History:        Patient has prior history of Echocardiogram examinations, most                 recent 01/26/2024. Risk Factors:Hypertension, Diabetes and                 Dyslipidemia.  Sonographer:    Damien Senior RDCS Referring Phys: [id number] [place] A Myleen Brailsford  Sonographer Comments: Limited to assess for pericardial effusion during  leadless pacer implant IMPRESSIONS  1. Moderate pericardial effusion.  2. Left ventricular ejection fraction, by estimation, is <20%. The left ventricle has severely decreased function.  3. Right ventricular systolic function is severely reduced. FINDINGS  Left Ventricle: Left ventricular ejection fraction, by estimation, is <20%. The left ventricle has severely decreased function. Right Ventricle: Right ventricular systolic function is severely reduced. Pericardium: A moderately sized pericardial effusion is present. Lonni Nanas MD Electronically signed by Lonni Nanas MD Signature Date/Time: 2024-02-25/4:43:54 PM    Final    DG CHEST PORT 1 VIEW Result Date: 01/27/2024 EXAM: 1 VIEW(S) XRAY OF THE CHEST 01/27/2024 12:01:36 PM COMPARISON: 01/26/2024 CLINICAL HISTORY: 200808 Hypoxia 200808 FINDINGS: LINES, TUBES AND DEVICES: Single lead pacer. Loop recorder. LUNGS AND PLEURA: Similar tiny bilateral pleural effusions. Similar mild pulmonary venous congestion. Left greater than right base subsegmental atelectasis. No pneumothorax. HEART AND MEDIASTINUM: Mild cardiomegaly. Transverse aortic atherosclerosis. BONES AND SOFT TISSUES: No acute osseous abnormality. IMPRESSION: 1. No significant change  in cardiomegaly, pulmonary venous congestion, and tiny bilateral pleural effusions. 2. Similar bibasilar atelectasis. Electronically signed by: Rockey Kilts MD 01/27/2024 12:40 PM EST RP Workstation: [id number]   ECHOCARDIOGRAM COMPLETE Result Date: 01/26/2024    ECHOCARDIOGRAM REPORT   Patient Name:   Shelby Walker Date of Exam: 01/26/2024 Medical Rec #:  [id number]   Height:       62.0 in Accession #:    [id number]  Weight:       116.2 lb Date of Birth:  [birth year]   BSA:          1.518 m Patient Age:    89 years    BP:           91/40 mmHg Patient Gender: F           HR:           80 bpm. Exam Location:  Inpatient Procedure: 2D Echo, Cardiac Doppler, Color Doppler and Intracardiac             Opacification Agent (Both Spectral and Color Flow Doppler were            utilized during procedure). Indications:    I44.2 Complete heart block  History:        Patient has prior history of Echocardiogram examinations, most                 recent 01/29/2022. Risk Factors:Hypertension, Diabetes and                 Dyslipidemia.  Sonographer:    Damien Senior RDCS Referring Phys: [id number] VINCENT A DELGADO  Sonographer Comments: Scanned supine on temporary pacemaker IMPRESSIONS  1. Left ventricular ejection fraction, by estimation, is >75%. The left ventricle has hyperdynamic function. The left ventricle has no regional wall motion abnormalities. There is mild concentric left ventricular hypertrophy. Left ventricular diastolic parameters are consistent with Grade I diastolic dysfunction (impaired relaxation). Elevated left ventricular end-diastolic pressure.  2. Right ventricular systolic function is normal. The right ventricular size is normal. There is mildly elevated pulmonary artery systolic pressure. The estimated right ventricular systolic pressure is 40.2 mmHg.  3. Left atrial size was severely dilated.  4. Right atrial size was moderately dilated.  5. The mitral valve is degenerative. Trivial mitral valve regurgitation. Mild mitral stenosis. The mean mitral valve gradient is 4.0 mmHg with average heart rate of 80 bpm.  6. The aortic valve is tricuspid. Aortic valve regurgitation is not visualized. Aortic valve sclerosis/calcification is present, without any evidence of aortic stenosis.  7. The inferior vena cava is dilated in size with <50% respiratory variability, suggesting right atrial pressure of 15 mmHg. FINDINGS  Left Ventricle: Left ventricular ejection fraction, by estimation, is >75%. The left ventricle has hyperdynamic function. The left ventricle has no regional wall motion abnormalities. Definity  contrast agent was given IV to delineate the left ventricular endocardial borders. The left ventricular  internal cavity size was normal in size. There is mild concentric left ventricular hypertrophy. Left ventricular diastolic parameters are consistent with Grade I diastolic dysfunction (impaired relaxation). Elevated left ventricular end-diastolic pressure. Right Ventricle: The right ventricular size is normal. Right ventricular systolic function is normal. There is mildly elevated pulmonary artery systolic pressure. The tricuspid regurgitant velocity is 2.51 m/s, and with an assumed right atrial pressure of 15 mmHg, the estimated right ventricular systolic pressure is 40.2 mmHg. Left Atrium: Left atrial size was severely dilated. Right Atrium: Right atrial size was moderately dilated. Pericardium: There  is no evidence of pericardial effusion. Presence of epicardial fat layer. Mitral Valve: The mitral valve is degenerative in appearance. Mild mitral annular calcification. Trivial mitral valve regurgitation. Mild mitral valve stenosis. MV peak gradient, 8.4 mmHg. The mean mitral valve gradient is 4.0 mmHg with average heart rate of 80 bpm. Tricuspid Valve: The tricuspid valve is normal in structure. Tricuspid valve regurgitation is mild . No evidence of tricuspid stenosis. Aortic Valve: The aortic valve is tricuspid. Aortic valve regurgitation is not visualized. Aortic valve sclerosis/calcification is present, without any evidence of aortic stenosis. Pulmonic Valve: The pulmonic valve was grossly normal. Pulmonic valve regurgitation is trivial. No evidence of pulmonic stenosis. Aorta: The aortic root and ascending aorta are structurally normal, with no evidence of dilitation. Venous: The inferior vena cava is dilated in size with less than 50% respiratory variability, suggesting right atrial pressure of 15 mmHg. IAS/Shunts: The atrial septum is grossly normal.  LEFT VENTRICLE PLAX 2D LVIDd:         3.10 cm   Diastology LVIDs:         2.20 cm   LV e' medial:   4.79 cm/s LV PW:         1.10 cm   LV E/e' medial: 26.1 LV  IVS:        1.00 cm LVOT diam:     1.80 cm LV SV:         52 LV SV Index:   34 LVOT Area:     2.53 cm  RIGHT VENTRICLE RV S prime:     17.00 cm/s TAPSE (M-mode): 1.9 cm LEFT ATRIUM             Index        RIGHT ATRIUM           Index LA diam:        4.15 cm 2.73 cm/m   RA Area:     20.70 cm LA Vol (A2C):   68.0 ml 44.81 ml/m  RA Volume:   67.10 ml  44.21 ml/m LA Vol (A4C):   68.6 ml 45.20 ml/m LA Biplane Vol: 71.7 ml 47.24 ml/m  AORTIC VALVE LVOT Vmax:   91.60 cm/s LVOT Vmean:  73.100 cm/s LVOT VTI:    0.205 m  AORTA Ao Root diam: 2.53 cm Ao Asc diam:  2.98 cm MITRAL VALVE                TRICUSPID VALVE MV Area (PHT): 2.53 cm     TR Peak grad:   25.2 mmHg MV Area VTI:   1.45 cm     TR Vmax:        251.00 cm/s MV Peak grad:  8.4 mmHg MV Mean grad:  4.0 mmHg     SHUNTS MV Vmax:       1.45 m/s     Systemic VTI:  0.20 m MV Vmean:      90.0 cm/s    Systemic Diam: 1.80 cm MV Decel Time: 300 msec MV E velocity: 125.00 cm/s MV A velocity: 158.00 cm/s MV E/A ratio:  0.79 Redell Shallow MD Electronically signed by Redell Shallow MD Signature Date/Time: 01/26/2024/12:08:19 PM    Final    DG Chest 1 View Result Date: 01/26/2024 CLINICAL DATA:  [id number] PNA (pneumonia) [id number] EXAM: CHEST  1 VIEW COMPARISON:  January 26, 2024 FINDINGS: The cardiomediastinal silhouette is unchanged in contour.Atherosclerotic calcifications of the aorta. RIGHT neck approach pacing device tip terminating over the expected area of  the RIGHT ventricle. Similar LEFT pleural blunting. No pneumothorax. Bibasilar platelike opacities most consistent with atelectasis. No acute pleuroparenchymal abnormality. IMPRESSION: No acute cardiopulmonary abnormality. Electronically Signed   By: Corean Salter M.D.   On: 01/26/2024 09:51   CARDIAC CATHETERIZATION Result Date: 01/26/2024 Temporary pacemaker implantation for complete heart block 01/26/2024: Under fluoroscopic guidance and micropuncture technique, right IJ utilized and a 6 French  sheath introduced followed by balloontipped pacemaker insertion into the RV apex with excellent capture at 0.1 mA.  Pacemaker left at 10 mA at VVI 70 bpm.  Patient is in atrial tachycardia and presently has not needed pacing.   CT CHEST ABDOMEN PELVIS WO CONTRAST Result Date: 01/26/2024 EXAM: CT CHEST, ABDOMEN AND PELVIS WITHOUT CONTRAST 01/26/2024 02:20:33 AM TECHNIQUE: CT of the chest, abdomen and pelvis was performed without the administration of intravenous contrast. Multiplanar reformatted images are provided for review. Automated exposure control, iterative reconstruction, and/or weight based adjustment of the mA/kV was utilized to reduce the radiation dose to as low as reasonably achievable. COMPARISON: 04/03/2023 CLINICAL HISTORY: Recent syncope. FINDINGS: CHEST: MEDIASTINUM AND LYMPH NODES: Thoracic aorta and branches show atherosclerotic calcification. No aneurysmal dilatation is seen. Lack of IV contrast precludes evaluation for dissection. The heart is not significantly enlarged. Pulmonary artery is not enlarged. Coronary calcifications are seen. The esophagus is within normal limits. No mediastinal, hilar or axillary lymphadenopathy. The thoracic inlet is within normal limits. LUNGS AND PLEURA: A tiny 3 mm nodule is noted in the left upper lobe. This is stable from a prior exam from 2023 and felt to be benign in etiology. No other significant pulmonary nodule is noted. Minimal left basilar atelectasis is seen. No focal effusion or pneumothorax is seen. ABDOMEN AND PELVIS: LIVER: The liver shows evidence of pneumobilia likely related to a prior procedure. No ductal dilatation is seen. GALLBLADDER AND BILE DUCTS: The gallbladder has been surgically removed. No biliary ductal dilatation. SPLEEN: The spleen is within normal limits. PANCREAS: The pancreas is within normal limits. ADRENAL GLANDS: Adrenal glands are unremarkable. KIDNEYS, URETERS AND BLADDER: Cystic lesions are again seen in the kidneys  bilaterally, stable dating back to 2023 and felt to be benign in etiology. No further follow-up is recommended. No renal calculi or obstructive changes are seen. The bladder is well distended. GI AND BOWEL: Stomach and small bowel are unremarkable. No obstructive or inflammatory changes of the colon are seen. Significant sigmoid diverticulosis is noted without diverticulitis. Scattered fecal material is noted throughout the colon without obstructive change. The appendix is within normal limits. REPRODUCTIVE ORGANS: The uterus and adnexa are within normal limits. PERITONEUM AND RETROPERITONEUM: No free fluid is seen. No free air. VASCULATURE: Abdominal aortic calcifications are seen without aneurysmal dilatation. ABDOMINAL AND PELVIS LYMPH NODES: No lymphadenopathy is noted. BONES AND SOFT TISSUES: No acute rib abnormality is noted. Degenerative changes of the thoracic spine are seen. Changes of prior vertebral augmentation at L1 are noted. Degenerative changes of the lumbar spine are seen. No focal soft tissue abnormality. IMPRESSION: 1. No acute findings in the chest, abdomen, and pelvis related to syncope. 2. Stable 3 mm left upper lobe pulmonary nodule, benign, no follow-up recommended per prior stability. 3. Significant sigmoid diverticulosis without diverticulitis. Electronically signed by: Oneil Devonshire MD 01/26/2024 02:31 AM EST RP Workstation: [id number]   DG Chest Port 1 View Result Date: 01/26/2024 EXAM: 1 VIEW(S) XRAY OF THE CHEST 01/26/2024 12:58:00 AM COMPARISON: 08/08/2022. CLINICAL HISTORY: fall FINDINGS: LINES, TUBES AND DEVICES: Loop recorder is noted on the  left. LUNGS AND PLEURA: The lungs are well aerated bilaterally. No focal pulmonary opacity. No pleural effusion. No pneumothorax. HEART AND MEDIASTINUM: Cardiac shadow is stable. Aortic calcifications are again seen. BONES AND SOFT TISSUES: No acute osseous abnormality. Old Right 7th rib fracture is noted posteriorly. IMPRESSION: 1. No acute  cardiopulmonary process. Electronically signed by: Oneil Devonshire MD 01/26/2024 01:09 AM EST RP Workstation: [id number]   CT MAXILLOFACIAL WO CONTRAST Result Date: 01/26/2024 EXAM: CT OF THE FACE WITHOUT CONTRAST 01/26/2024 12:26:00 AM TECHNIQUE: CT of the face was performed without the administration of intravenous contrast. Multiplanar reformatted images are provided for review. Automated exposure control, iterative reconstruction, and/or weight based adjustment of the mA/kV was utilized to reduce the radiation dose to as low as reasonably achievable. COMPARISON: None available. CLINICAL HISTORY: Facial trauma, blunt Facial trauma, blunt FINDINGS: FACIAL BONES: No acute facial fracture. No mandibular dislocation. No suspicious bone lesion. ORBITS: Globes are intact. No acute traumatic injury. No inflammatory change. SINUSES AND MASTOIDS: No acute abnormality. SOFT TISSUES: No acute abnormality. IMPRESSION: 1. No acute facial fracture. Electronically signed by: Franky Stanford MD 01/26/2024 12:57 AM EST RP Workstation: [id number]   CT CERVICAL SPINE WO CONTRAST Result Date: 01/26/2024 EXAM: CT CERVICAL SPINE WITHOUT CONTRAST 01/26/2024 12:26:00 AM TECHNIQUE: CT of the cervical spine was performed without the administration of intravenous contrast. Multiplanar reformatted images are provided for review. Automated exposure control, iterative reconstruction, and/or weight based adjustment of the mA/kV was utilized to reduce the radiation dose to as low as reasonably achievable. COMPARISON: None available. CLINICAL HISTORY: Neck trauma (Age >= 65y) FINDINGS: CERVICAL SPINE: BONES AND ALIGNMENT: Grade 1 anterolisthesis at C3-C4 and grade 1 retrolisthesis at C4-C5. No acute fracture. DEGENERATIVE CHANGES: No significant degenerative changes. SOFT TISSUES: No prevertebral soft tissue swelling. IMPRESSION: 1. No acute abnormality of the cervical spine related to neck trauma. 2. Grade 1 anterolisthesis at C3-4 and grade 1  retrolisthesis at C4-5. Electronically signed by: Franky Stanford MD 01/26/2024 12:56 AM EST RP Workstation: [id number]   CT HEAD WO CONTRAST ( ) Result Date: 01/26/2024 EXAM: CT HEAD WITHOUT CONTRAST 01/26/2024 12:26:00 AM TECHNIQUE: CT of the head was performed without the administration of intravenous contrast. Automated exposure control, iterative reconstruction, and/or weight based adjustment of the mA/kV was utilized to reduce the radiation dose to as low as reasonably achievable. COMPARISON: 07/22/2018 CLINICAL HISTORY: Head trauma, minor (Age >= 65y) FINDINGS: BRAIN AND VENTRICLES: Slightly increased size of right parafalcine anterior meningioma measuring 0.9 x 0.9 x 1.1 cm, previously. No acute intracranial hemorrhage. Periventricular white matter changes, likely sequela of chronic small vessel ischemic disease. No evidence of acute infarct. No hydrocephalus. No extra-axial collection. No mass effect or midline shift. ORBITS: Bilateral lens replacements. SINUSES: No acute abnormality. SOFT TISSUES AND SKULL: Left parieto-occipital scalp hematoma. No skull fracture. IMPRESSION: 1. No acute intracranial abnormality related to the head trauma. 2. Left parieto-occipital scalp hematoma. 3. Slightly increased size of right parafalcine anterior meningioma, now measuring 0.9 x 0.9 x 1.1 cm. 4. Periventricular white matter changes, likely sequela of chronic small vessel ischemic disease. Electronically signed by: Franky Stanford MD 01/26/2024 12:53 AM EST RP Workstation: [id number]   Microbiology Recent Results (from the past 240 hours)  MRSA Next Gen by PCR, Nasal     Status: None   Collection Time: 01/26/24  4:50 AM   Specimen: Nasal Mucosa; Nasal Swab  Result Value Ref Range Status   MRSA by PCR Next Gen NOT DETECTED NOT DETECTED Final    Comment: (  NOTE) The GeneXpert MRSA Assay (FDA approved for NASAL specimens only), is one component of a comprehensive MRSA colonization surveillance program. It is  not intended to diagnose MRSA infection nor to guide or monitor treatment for MRSA infections. Test performance is not FDA approved in patients less than 47 years old. Performed at [facility], [address] [place], [place] [address]   Culture, blood (Routine X 2) w Reflex to ID Panel     Status: None   Collection Time: 01/27/24  6:47 AM   Specimen: BLOOD  Result Value Ref Range Status   Specimen Description BLOOD SITE NOT SPECIFIED  Final   Special Requests   Final    BOTTLES DRAWN AEROBIC AND ANAEROBIC Blood Culture adequate volume   Culture   Final    NO GROWTH 5 DAYS Performed at [facility], [address], [place], [place] [address]    Report Status 02/01/2024 FINAL  Final  Culture, blood (Routine X 2) w Reflex to ID Panel     Status: None   Collection Time: 01/27/24  7:01 AM   Specimen: BLOOD  Result Value Ref Range Status   Specimen Description BLOOD SITE NOT SPECIFIED  Final   Special Requests   Final    BOTTLES DRAWN AEROBIC AND ANAEROBIC Blood Culture results may not be optimal due to an inadequate volume of blood received in culture bottles   Culture   Final    NO GROWTH 5 DAYS Performed at [facility], [address] [place], [place] [address]    Report Status 02/01/2024 FINAL  Final  Expectorated Sputum Assessment w Gram Stain, Rflx to Resp Cult     Status: None   Collection Time: 01/27/24  3:15 PM   Specimen: Expectorated Sputum  Result Value Ref Range Status   Specimen Description EXPECTORATED SPUTUM  Final   Special Requests Normal  Final   Sputum evaluation   Final    Sputum specimen not acceptable for testing.  Please recollect.   NOTIFIED RN ARVIN NOVAK ON 01/27/24 @ 1907 BY DRT Performed at [facility], [address] [place], [place] [address]    Report Status 01/27/2024 FINAL  Final  Urine Culture     Status: Abnormal   Collection Time: 01/27/24  3:15 PM   Specimen: Urine, Random  Result Value Ref Range Status    Specimen Description URINE, RANDOM  Final   Special Requests   Final    URINE, CLEAN CATCH Performed at [facility], [address] [place], [place] [address]    Culture MULTIPLE SPECIES PRESENT, SUGGEST RECOLLECTION (A)  Final   Report Status 01/28/2024 FINAL  Final  Surgical PCR screen     Status: None   Collection Time: 01/28/24  4:26 PM   Specimen: Nasal Mucosa; Nasal Swab  Result Value Ref Range Status   MRSA, PCR NEGATIVE NEGATIVE Final   Staphylococcus aureus NEGATIVE NEGATIVE Final    Comment: (NOTE) The Xpert SA Assay (FDA approved for NASAL specimens in patients 57 years of age and older), is one component of a comprehensive surveillance program. It is not intended to diagnose infection nor to guide or monitor treatment. Performed at [facility], [address] [place], [place] [address]    Lab Basic Metabolic Panel: Recent Labs  Lab 01/28/24 0607 02-08-2024 0220  NA 135 139  K 4.6 4.1  CL 101 103  CO2 21* 26  GLUCOSE 148*  110*  BUN 41* 37*  CREATININE 1.76* 1.85*  CALCIUM  7.3* 7.7*   Liver Function Tests: No results for input(s): AST, ALT, ALKPHOS, BILITOT, PROT, ALBUMIN  in the last 168 hours. No results for input(s): LIPASE, AMYLASE in the last 168 hours. No results for input(s): AMMONIA in the last 168 hours. CBC: Recent Labs  Lab 01/28/24 0607 2024-02-13 0220  WBC 11.0* 9.9  HGB 8.1* 9.6*  HCT 25.2* 29.6*  MCV 99.6 98.7  PLT 126* 185   Sepsis Labs: Recent Labs  Lab 01/28/24 0607 01/28/24 0843 2024/02/13 0220  WBC 11.0*  --  9.9  LATICACIDVEN  --  1.7  --    Procedures/Operations   Procedure:  US  Venous Access (x1)  MDT Micra Implant   Pre-Op  Diagnosis: CHB    Post-Op Diagnosis: same    Procedure Date: Feb 13, 2024   Attending: Adina Primus, MD    Anesthesia: monitored anesthesia care   Operative Report: RIGHT groin prepped with chlorhexidine  scrub. 30 cc lidocaine  injected. Using ultrasound, the LEFT  femoral vein was visualized and was small. During real-time ultrasound, the needle was seen directly puncturing the vein. Using the modified Seldinger technique, a wire was advanced but had difficulty threading despite being intraluminal on ultrasound. Several attempts were made to access the RFV however tortuosity limited any sheath placement. The LFV was then easily accessed with a standard access needle. An 8 Fr sheath placed into the vessel. Ultrasound images from vascular access were recorded and retained on the system. Serial dilation up to 16 Fr performed and the 27 Fr Micra sheath was advanced under fluoro guidance to the RA. 3000 units of heparin  was given. The Micra device and positioning sheath was advanced across the tricuspid valve, aimed toward the mid septum. This was confirmed with contrast injection in both the RAO and LAO views. The initial position was towards the apical septum. The sheath was redirected to the mid septum on RAO and LAO views. At this point there was profound hypotension. Stat echo and interventional cardiology was called who arrived promptly. The Micra was deployed to allow for temporary pacing if needed and had ventricular capture. There was an effusion evident on fluoroscopy. Dr. Wonda assisted in pericardial access and drain placement. A large volume of blood was aspirated however there was no femoral arterial pulse on palpation or with ultrasound evaluation. The patient remained DNR/DNI which was again confirmed with the family and consistent with our discussion immediately prior to the procedure regarding code status.     EBL: less than 50 mL  Implanted Hardware: Implant Name Type Inv. Item Serial No. Manufacturer Lot No. LRB No. Used Action  CLOSURE PERCLOSE PROSTYLE - [id number] Vascular Products CLOSURE PERCLOSE PROSTYLE   [employer] [id number] N/A 1 Implanted  CLOSURE PERCLOSE PROSTYLE - [id number] Vascular Products CLOSURE PERCLOSE PROSTYLE   [employer]OTT  VASCULAR DEVICES [id number] N/A 1 Implanted  PACEMAKER LEADLESS AV2 MICRA - [id number] E Pacemaker PACEMAKER LEADLESS AV2 MICRA [id number] E [employer]   N/A 1 Implanted    Complications: Likely RV laceration/perforation leading to cardiac tamponade and death Disposition: deceased    Summary: 1. Micra AV2 implant 2. Epicardial drain placement with large volume blood aspirated however patient remained PEA 3. RV laceration/perforation resulting in cardiac tamponade and death 4. DNR/DNI confirmed again with family and patient passed   Shelby DELENA Primus, MD [facility]  Cardiac Electrophysiology

## 2024-03-05 NOTE — Anesthesia Postprocedure Evaluation (Addendum)
 Anesthesia Post Note  Patient: Shelby Walker  Procedure(s) Performed: PACEMAKER LEADLESS INSERTION     Patient location during evaluation: Cath Lab Anesthesia Type: MAC Anesthetic complications: yes Comments: Patient did not survive procedure and passed away in the cath lab.   Encounter Notable Events  Notable Event Outcome Phase Comment  Cardiac perforation Deceased Intraprocedure   Cardiac tamponade Deceased Intraprocedure   Death Deceased Intraprocedure                    Boluwatife Mutchler

## 2024-03-27 ENCOUNTER — Encounter

## 2024-03-31 ENCOUNTER — Ambulatory Visit

## 2024-04-08 ENCOUNTER — Ambulatory Visit: Admitting: Internal Medicine

## 2024-05-01 ENCOUNTER — Ambulatory Visit: Admitting: Physician Assistant
